# Patient Record
Sex: Female | Born: 1955 | Race: White | Hispanic: No | Marital: Single | Smoking: Current every day smoker
Health system: Southern US, Community
[De-identification: ages and names within clinical notes are randomized; demographics above are authoritative.]

## PROBLEM LIST (undated history)

## (undated) VITALS — BP 126/87 | HR 93 | Temp 98.4°F | Resp 20 | Ht 64.96 in | Wt 149.0 lb

## (undated) DIAGNOSIS — R569 Unspecified convulsions: Secondary | ICD-10-CM

## (undated) DIAGNOSIS — T7840XA Allergy, unspecified, initial encounter: Secondary | ICD-10-CM

## (undated) DIAGNOSIS — K219 Gastro-esophageal reflux disease without esophagitis: Secondary | ICD-10-CM

## (undated) DIAGNOSIS — G709 Myoneural disorder, unspecified: Secondary | ICD-10-CM

## (undated) DIAGNOSIS — F32A Depression, unspecified: Secondary | ICD-10-CM

## (undated) DIAGNOSIS — J449 Chronic obstructive pulmonary disease, unspecified: Secondary | ICD-10-CM

## (undated) DIAGNOSIS — R0600 Dyspnea, unspecified: Secondary | ICD-10-CM

## (undated) DIAGNOSIS — I480 Paroxysmal atrial fibrillation: Secondary | ICD-10-CM

## (undated) DIAGNOSIS — I10 Essential (primary) hypertension: Secondary | ICD-10-CM

## (undated) DIAGNOSIS — G629 Polyneuropathy, unspecified: Secondary | ICD-10-CM

## (undated) DIAGNOSIS — F419 Anxiety disorder, unspecified: Secondary | ICD-10-CM

## (undated) DIAGNOSIS — R519 Headache, unspecified: Secondary | ICD-10-CM

## (undated) DIAGNOSIS — R32 Unspecified urinary incontinence: Secondary | ICD-10-CM

## (undated) DIAGNOSIS — Z7901 Long term (current) use of anticoagulants: Secondary | ICD-10-CM

## (undated) DIAGNOSIS — F329 Major depressive disorder, single episode, unspecified: Secondary | ICD-10-CM

## (undated) DIAGNOSIS — K3532 Acute appendicitis with perforation and localized peritonitis, without abscess: Secondary | ICD-10-CM

## (undated) DIAGNOSIS — F1010 Alcohol abuse, uncomplicated: Secondary | ICD-10-CM

## (undated) DIAGNOSIS — H269 Unspecified cataract: Secondary | ICD-10-CM

## (undated) HISTORY — DX: Long term (current) use of anticoagulants: Z79.01

## (undated) HISTORY — PX: APPENDECTOMY: SHX54

## (undated) HISTORY — DX: Chronic obstructive pulmonary disease, unspecified: J44.9

## (undated) HISTORY — DX: Allergy, unspecified, initial encounter: T78.40XA

---

## 1999-12-24 ENCOUNTER — Other Ambulatory Visit: Admission: RE | Admit: 1999-12-24 | Discharge: 1999-12-24 | Payer: Self-pay | Admitting: Obstetrics & Gynecology

## 2004-07-04 ENCOUNTER — Emergency Department (HOSPITAL_COMMUNITY): Admission: EM | Admit: 2004-07-04 | Discharge: 2004-07-04 | Payer: Self-pay | Admitting: Family Medicine

## 2006-08-29 ENCOUNTER — Other Ambulatory Visit: Admission: RE | Admit: 2006-08-29 | Discharge: 2006-08-29 | Payer: Self-pay | Admitting: Obstetrics and Gynecology

## 2007-05-14 ENCOUNTER — Emergency Department (HOSPITAL_COMMUNITY): Admission: EM | Admit: 2007-05-14 | Discharge: 2007-05-14 | Payer: Self-pay | Admitting: Emergency Medicine

## 2008-08-07 ENCOUNTER — Encounter (INDEPENDENT_AMBULATORY_CARE_PROVIDER_SITE_OTHER): Payer: Self-pay | Admitting: Surgery

## 2008-08-07 ENCOUNTER — Inpatient Hospital Stay (HOSPITAL_COMMUNITY): Admission: EM | Admit: 2008-08-07 | Discharge: 2008-08-12 | Payer: Self-pay | Admitting: Emergency Medicine

## 2008-08-23 ENCOUNTER — Encounter: Admission: RE | Admit: 2008-08-23 | Discharge: 2008-08-23 | Payer: Self-pay | Admitting: Surgery

## 2008-10-03 ENCOUNTER — Other Ambulatory Visit: Admission: RE | Admit: 2008-10-03 | Discharge: 2008-10-03 | Payer: Self-pay | Admitting: Obstetrics and Gynecology

## 2010-02-12 HISTORY — PX: OTHER SURGICAL HISTORY: SHX169

## 2010-02-14 ENCOUNTER — Emergency Department (HOSPITAL_COMMUNITY): Admission: EM | Admit: 2010-02-14 | Discharge: 2010-02-14 | Payer: Self-pay | Admitting: Emergency Medicine

## 2010-08-27 LAB — DIFFERENTIAL
Basophils Relative: 0 % (ref 0–1)
Lymphocytes Relative: 11 % — ABNORMAL LOW (ref 12–46)
Lymphs Abs: 1 10*3/uL (ref 0.7–4.0)
Neutro Abs: 7.4 10*3/uL (ref 1.7–7.7)

## 2010-08-27 LAB — CBC
HCT: 42.5 % (ref 36.0–46.0)
MCH: 32.5 pg (ref 26.0–34.0)
MCHC: 34.4 g/dL (ref 30.0–36.0)
MCV: 94.7 fL (ref 78.0–100.0)
Platelets: 148 10*3/uL — ABNORMAL LOW (ref 150–400)
WBC: 9.2 10*3/uL (ref 4.0–10.5)

## 2010-08-27 LAB — POCT I-STAT, CHEM 8
Chloride: 104 mEq/L (ref 96–112)
Creatinine, Ser: 0.7 mg/dL (ref 0.4–1.2)
Hemoglobin: 15.6 g/dL — ABNORMAL HIGH (ref 12.0–15.0)
Potassium: 3.6 mEq/L (ref 3.5–5.1)
Sodium: 135 mEq/L (ref 135–145)
TCO2: 23 mmol/L (ref 0–100)

## 2010-08-27 LAB — PROTIME-INR: Prothrombin Time: 13.4 seconds (ref 11.6–15.2)

## 2010-09-29 LAB — URINALYSIS, ROUTINE W REFLEX MICROSCOPIC
Glucose, UA: NEGATIVE mg/dL
Hgb urine dipstick: NEGATIVE
Ketones, ur: 15 mg/dL — AB
Nitrite: NEGATIVE
Protein, ur: 100 mg/dL — AB
Specific Gravity, Urine: 1.027 (ref 1.005–1.030)
Urobilinogen, UA: 1 mg/dL (ref 0.0–1.0)
pH: 6 (ref 5.0–8.0)

## 2010-09-29 LAB — COMPREHENSIVE METABOLIC PANEL WITH GFR
AST: 18 U/L (ref 0–37)
Albumin: 3.3 g/dL — ABNORMAL LOW (ref 3.5–5.2)
Alkaline Phosphatase: 55 U/L (ref 39–117)
Creatinine, Ser: 0.94 mg/dL (ref 0.4–1.2)
GFR calc Af Amer: >60 mL/min (ref >60)
Sodium: 135 meq/L (ref 135–145)
Total Protein: 6.8 g/dL (ref 6.0–8.3)

## 2010-09-29 LAB — COMPREHENSIVE METABOLIC PANEL
ALT: 14 U/L (ref 0–35)
BUN: 9 mg/dL (ref 6–23)
CO2: 22 mEq/L (ref 19–32)
Calcium: 9.2 mg/dL (ref 8.4–10.5)
Chloride: 101 mEq/L (ref 96–112)
GFR calc non Af Amer: >60 mL/min (ref >60)
Glucose, Bld: 128 mg/dL — ABNORMAL HIGH (ref 70–99)
Potassium: 4.1 mEq/L (ref 3.5–5.1)
Total Bilirubin: 0.9 mg/dL (ref 0.3–1.2)

## 2010-09-29 LAB — URINE MICROSCOPIC-ADD ON

## 2010-09-29 LAB — CBC
HCT: 33.2 % — ABNORMAL LOW (ref 36.0–46.0)
Hemoglobin: 10.6 g/dL — ABNORMAL LOW (ref 12.0–15.0)
MCHC: 31.9 g/dL (ref 30.0–36.0)
MCV: 65.7 fL — ABNORMAL LOW (ref 78.0–100.0)
Platelets: 248 10*3/uL (ref 150–400)
RBC: 5.06 MIL/uL (ref 3.87–5.11)
RDW: 21.1 % — ABNORMAL HIGH (ref 11.5–15.5)
WBC: 12.9 10*3/uL — ABNORMAL HIGH (ref 4.0–10.5)

## 2010-09-29 LAB — DIFFERENTIAL
Basophils Absolute: 0 10*3/uL (ref 0.0–0.1)
Basophils Relative: 0 % (ref 0–1)
Eosinophils Absolute: 0 10*3/uL (ref 0.0–0.7)
Eosinophils Relative: 0 % (ref 0–5)
Lymphocytes Relative: 4 % — ABNORMAL LOW (ref 12–46)
Lymphs Abs: 0.5 10*3/uL — ABNORMAL LOW (ref 0.7–4.0)
Monocytes Absolute: 0.9 K/uL (ref 0.1–1.0)
Monocytes Relative: 7 % (ref 3–12)
Neutro Abs: 11.5 K/uL — ABNORMAL HIGH (ref 1.7–7.7)
Neutrophils Relative %: 89 % — ABNORMAL HIGH (ref 43–77)
Smear Review: ADEQUATE

## 2010-09-29 LAB — PREGNANCY, URINE: Preg Test, Ur: NEGATIVE

## 2010-09-29 LAB — LIPASE, BLOOD: Lipase: 23 U/L (ref 11–59)

## 2010-10-27 NOTE — Op Note (Signed)
Karen Casey, Karen Casey              ACCOUNT NO.:  [id number]   MEDICAL RECORD NO.:  [id number]          PATIENT TYPE:  INP   LOCATION:  5152                         FACILITY:  [facility]   PHYSICIAN:  Maisie Fus A. Cornett, M.D.DATE OF BIRTH:  [birth year]   DATE OF PROCEDURE:  DATE OF DISCHARGE:                               OPERATIVE REPORT   PREOPERATIVE DIAGNOSIS:  Perforated appendicitis with right lower  quadrant abscess and phlegmon.   POSTOPERATIVE DIAGNOSIS:  Perforated appendicitis with right lower  quadrant abscess and phlegmon plus diffuse peritonitis.   PROCEDURE:  Laparoscopic appendectomy for perforated appendicitis with  periappendiceal abscess.   SURGEON:  Maisie Fus A. Cornett, MD   ANESTHESIA:  General endotracheal anesthesia with 0.25% Sensorcaine  local.   ESTIMATED BLOOD LOSS:  50 mL.   SPECIMEN:  Appendix to Pathology.   DRAINS:  53 Blake to right lower quadrant.   INDICATIONS FOR PROCEDURE:  The patient is a 55 year old female with 4-  day history of abdominal pain.  CT scan showed right lower quadrant  phlegmon, perforated appendicitis.  On examination, she had diffuse  appendicitis.  We recommended laparoscopic possible laparotomy to treat  this.  Risk of the procedure was discussed with the patient.  I met the  patient in the holding area after she was initially evaluated by Dr.  Claud Kelp and Junious Silk, NP.  I discussed the procedure,  answering questions to the best of my ability and she understood and  agreed to proceed.  Risk of bleeding, infection, possible laparotomy,  possible bowel resection, abscess formation, injury to bowel or other  organ, potential risks were fistula, overall potential complications.  The family voiced agreement to proceed.   DESCRIPTION OF PROCEDURE:  The patient was brought to the operating  room.  After induction of general anesthesia, both arms were tucked.  Foley catheter was placed.  The abdomen was prepped and  draped in  sterile fashion.  A 1-cm supraumbilical incision was made.  Dissection  was carried down to the fascia.  The fascia was opened with the scalpel.  Pursestring suture of 0 Vicryl was placed and I placed my finger through  the peritoneal lining into the abdominal cavity.  A 12-mm Hasson cannula  was placed under direct vision.  Pneumoperitoneum was created at 15 mmHg  of CO2.  Laparoscope was placed.  There was a significant right lower  quadrant phlegmon.  I placed two 5-mm ports both in left lower quadrant  since the bowel was stuck up to the anterior abdominal wall toward the  midline.  Once I did this, I was able to gently free up the small bowel,  break up the interloop, phlegmon, and abscesses involving the ileum.  Once I did this, I was able to identify the cecum which was acutely  inflamed.  I was able then to place the midline 5 mL port at this point.  Once I did this, I was able to roll the cecum over, break up numerous  loculations, and break into an abscess cavity.  At the base of this was  the appendix and it  appeared to have ruptured in the mid appendix.  I  was able to grab the tip and pull the appendix up.  In the base, the  appendix was remarkably healthy.  There was acute inflammation involving  the cecum, but the base of the appendix at the junction of cecum  appeared healthy and the tissue looked quite good.  Harmonic scalpel was  used to take the mesoappendix down.  Once I did this, I used a 5-mm  scope to the lower port and used a GIA 45 stapling device with a white  vascular cartridge and put this across the base of the appendix and  mesoappendix and fired this.  This amputated the appendix and did a nice  closure on the stump.  This was placed in an EndoCatch bag, extracted,  and passed off the field.  There was significant right lower quadrant  phlegmon and I was able to break up a lot of the adhesions between the  loops since there was exudative changes to  the ileum.  I used 9 L of  irrigation to irrigate out the entire abdominal cavity especially the  right lower quadrant until it was clear.  I broke up any interloop  adhesions to make sure that there was no formation of interloop  abscesses.  There was also some adhesions down to the omentum and  phlegmon.  I was able to separate the bowel quite easily without  injuring the bowel.  I examined the ileum carefully.  There was  significant exudative changes, but no evidence of bowel injury.  There  was a right lower quadrant abscess cavity just lateral to the cecum.  Once I irrigated this, I used Tisseel on the stump, given the amount of  inflammation and put this across the mesoappendix as well since there  was some oozing from this.  Hemostasis was excellent.  The irrigation  was suctioned out until was clear.  To the lower port site, I placed a  19 Blake drain into the right lower quadrant along the cecum and  extended down into the right pelvis.  Irrigation was suctioned out and  this was clear.  I reexamined the abdominal cavity, I saw no evidence of  injury to the cecum or small bowel or ascending colon.  The gallbladder,  liver, stomach, and remainder of the transverse, descending, and sigmoid  colon all appeared grossly normal.  Small bowel except for the above-  mentioned changes was grossly normal.  Uterus appeared normal.  I could  not see right or left adnexa well.  After reexamining the abdominal  cavity, I sutured the drain to the skin with 2-0 nylon.  This was placed  to bulb suction.  I then removed my ports under direct vision with no  signs of port site bleeding.  I closed the umbilical incision with a  pursestring suture of 0 Vicryl.  4-0 Monocryl was used to close skin.  I  opened the specimen, examined it myself, and found to be an appendiceal  perforation.  The appendix was for the most part gangrenous, quite  liquefied in look like.  The distal end appeared to be the  healthiest  firmest tissue where the staple line was.  Dermabond was applied.  All  final counts of sponge, needle, and instruments were found be correct at  this portion of the case.  The patient was then awoke and taken to  recovery in satisfactory condition.      Maisie Fus  Liberty Handy, M.D.  Electronically Signed     TAC/MEDQ  D:  08/07/2008  T:  08/08/2008  Job:  [id number]

## 2010-10-27 NOTE — H&P (Signed)
Karen Casey, Karen Casey              ACCOUNT NO.:  [id number]   MEDICAL RECORD NO.:  [id number]          PATIENT TYPE:  INP   LOCATION:  5152                         FACILITY:  [facility]   PHYSICIAN:  Angelia Mould. Derrell Lolling, M.D.DATE OF BIRTH:  05/04/1956   DATE OF ADMISSION:  08/07/2008  DATE OF DISCHARGE:                              HISTORY & PHYSICAL   PSYCHIATRIST:  Milagros Evener, M.D.   CHIEF COMPLAINT:  Acute appendicitis.   HISTORY OF PRESENT ILLNESS:  Karen Casey is a 55 year old female patient  in her usual state of health this past Saturday, abrupt change by  Sunday.  She had intractable nausea and vomiting, duration about 6-8  hours on Monday.  She was noticing abdominal pain focalized to the right  lower quadrant and this pain has progressively increased to the point  where it has become unbearable.  She initially thought she had reflux  symptoms when her symptoms first started on Sunday.  She finally  presented to the ER today where she was found to have leukocytosis,  significant abdominal pain with guarding and rebounding on exam.  CT of  the abdomen and pelvis was consistent with appendicitis and probable  perforation.   REVIEW OF SYSTEMS:  As per the history of present illness.  GI:  The  patient has been bloated for at least 2 days.  Her jeans would not fit  today and normally they are loose.  She has been able to keep liquids  down, but has anorexia and has not had solid foods since onset of  symptoms.  HEMATOLOGY/GYN:  The patient was noted to have an anemia on  her blood count here.  She does not recall having a history of anemia,  though reports she has had heavy cycles lately, but has not followed up  with a GYN doctor.   SOCIAL HISTORY:  Significant for tobacco one-half pack per day for  greater than 20 years.  She admits daily use of beer at least 3-5 per  day.  She currently works as a Solicitor at [employer], previously she worked  at [employer] for many years, but  lost that position when the company  went bankrupt.  She has a living boyfriend for 23 years and she has 1  child.   FAMILY HISTORY:  Noncontributory.   PAST MEDICAL HISTORY:  1. GERD.  2. Anxiety and depression.  3. Tobacco abuse.  4. Questionable alcoholism based on the patient's self-reported      alcohol intake.   PAST SURGICAL HISTORY:  Childhood removal of lesion in earlobe at age  43.   ALLERGIES:  NKDA.   CURRENT MEDICATIONS:  1. Over-the-counter GERD medicines such as Prilosec or Zantac.  2. Clonazepam 2 mg q.i.d.   The patient again reports increased stress and depression after [employer] closed down and her job was eliminated.   PHYSICAL EXAMINATION:  GENERAL:  Very anxious female patient complaining  of severe abdominal pain and very concerned over having to have  abdominal surgery.  VITAL SIGNS:  T-max 100.8, BP 152/77, pulse 110, and respirations 20.  PSYCH:  The patient is anxious somewhat more than to be expected for  current situation, but she is alert and oriented x3.  NEUROLOGIC:  Cranial nerves II-XII are grossly intact.  She is moving  all extremities x4.  No focal neurological deficits.  EYES:  Eyes are injected bilaterally.  Sclerae are injected bilaterally.  EARS, NOSE, AND THROAT:  Ears are symmetrical.  No otorrhea.  Nose is  midline.  No rhinorrhea.  Oral mucous membranes are pink, but dry.  CHEST:  Bilateral lung sounds clear to auscultation anteriorly.  Respiratory rate is nonlabored.  She is on room air.  CARDIOVASCULAR:  Heart sounds are S1 and S2.  No rubs, murmurs, thrills,  or gallops.  Pulses is regular, but tachycardiac.  No JVD.  No  peripheral edema.  ABDOMEN:  Distended, but soft.  She has bowel sounds present that are  hyperactive.  She has a reddish mottling appearance to the abdomen.  She  has guarding in the right lower quadrant and left lower quadrant  and  diffuse rebound tenderness.  EXTREMITIES:  Symmetrical in appearance  without cyanosis or clubbing.   LABORATORY DATA:  Sodium 135, potassium 4.1, CO2 22, glucose 128, BUN 9,  and creatinine is 0.94.  White count 12,900, neutrophils are 88%,  hemoglobin 10.6, and platelets 246,000.  Diagnostic CT of the abdomen  and pelvis again is consistent with appendicitis with perforation.  There is also inflammatory change adjacent to the distal small bowel  cecum and ascending colon.   IMPRESSION:  1. Acute perforated appendicitis with associated peritonitis,  and      leukocytosis and fever.  2. Volume depletion, evolving systemic inflammatory response syndrome.  3. Anxiety and depression disorder, on medications followed by      psychiatrist as outpatient.  4. Ongoing tobacco abuse.  5. Anemia suspected related to recent heavy menses, question      transitioning into menopause.  6. Suspected alcoholism based on self-reported alcohol intake history.   PLAN:  1. Admit to inpatient general surgical floor today and OR tonight as      soon as the room and surgeon available.  2. Aggressive fluid resuscitation with D5 half normal saline with 20 K      at 200 an hour.  3. Empiric Zosyn for enteric pathogen coverage.  4. Treat symptoms, Dilaudid for pain, Ativan for anxiety disorder      especially given the patient's history of regular clonazepam use      q.i.d. as well as possible issues related to anxiety with tobacco      and maybe alcohol withdrawal,      Zofran for any nausea.  5. Risks and benefits of procedure have been discussed with the      patient per Dr. Derrell Lolling and she verbalizes understanding.       Karen Casey, N.P.      Angelia Mould. Derrell Lolling, M.D.  Electronically Signed    ALE/MEDQ  D:  08/07/2008  T:  08/08/2008  Job:  [id number]   cc:   Milagros Evener, M.D.

## 2010-10-30 NOTE — Discharge Summary (Signed)
Karen Casey, Karen Casey              ACCOUNT NO.:  [id number]   MEDICAL RECORD NO.:  [id number]          PATIENT TYPE:  INP   LOCATION:  5152                         FACILITY:  [facility]   PHYSICIAN:  Angelia Mould. Derrell Lolling, M.D.DATE OF BIRTH:  1955-06-19   DATE OF ADMISSION:  08/07/2008  DATE OF DISCHARGE:  08/12/2008                               DISCHARGE SUMMARY   CHIEF COMPLAINT AND REASON FOR ADMISSION:  Ms. Rehm is a 55 year old  female patient who presented to the ER with severe right lower quadrant  abdominal pain ongoing for several days.  She initially thought, she had  a viral syndrome.  The pain became very severe, so she presented to the  ER.  She was found to have elevated white count of 12,900, neutrophils  88%.  CT scan consistent with probable acute perforated appendicitis.  On abdominal exam, she has signs of an acute abdomen with generalized  diffuse tenderness, more so in the right lower quadrant with guarding  and rebounding.   The patient was admitted with the following diagnoses.  1. Acute perforated appendicitis with peritonitis, leukocytosis, and      fever.  2. Volume depletion.  3. Known anxiety and depression disorder.  4. Medications, followed by psychiatrist, outpatient.  5. Ongoing tobacco abuse.  6. Suspected alcoholism based on the patient's self report, alcohol      history, see H and P.  7. Acute blood loss anemia, secondary to self report as heavy menses.   HOSPITAL COURSE:  The patient was admitted and taken to the OR where she  underwent laparoscopic appendectomy for perforated appendicitis with  abscess and peritonitis.  This was done by Dr. Luisa Hart.  The patient was  sent back to the general floor to recover.  She had been placed on  empiric antibiotic therapy prior to surgery with Zosyn and medications  for pain have been given as well.  She was tolerating Dilaudid, these  same medications were continued in the postoperative period.   Throughout remainder of the hospitalization, the patient slowly  transitioned from n.p.o. status to clear liquids to regular diet.  She  was transitioned over to oral pain medication.  She had a JP drain in  place with cloudy serous fluid that began to clear up over a several day  period, JP drain was opted to be left in place at discharge due to the  fact that the patient did have significant peritonitis.  She was  transitioned over from Zosyn to Augmentin and her pain was well managed  on Vicodin.   On postop day 5, she was determined ready for discharge.  She was  afebrile.  Her vital signs were stable.  She is tolerating the diet.  Bowel sounds are present.  She was having bowel movements.   FINAL DISCHARGE DIAGNOSES:  1. Acute abdomen with clinical signs of peritonitis, secondary to      perforated appendicitis with abscess.  2. Anemia.  The patient is still having heavy menses, question      transitioning the menopause.  3. Volume depletion, secondary to nausea and vomiting prior  to admit.      Resolved.  4. Ongoing tobacco and self-reported alcohol abuse.  5. History of anxiety and depression disorder followed by psychiatry      as an outpatient.   DISCHARGE MEDICATIONS:  The patient will resume the following home  medications.  1. Omeprazole 20 mg daily.  2. Clonazepam 2 mg q.i.d.  3. She will also start treatment with medications of Augmentin 875 mg      p.o. b.i.d.  4. Vicodin 1-2 tabs every 4 hours as needed for pain.   Wound care have Steri-Strips draw, okay to shower.  ENT will record  drainage from JP drain and bring to doctor visits.  Diet, low-sodium and  heart-healthy.  Return to work in 4 weeks.  Increase activity slowly.  May walk up steps.  No showering until after the JP drain is removed.  No lifting greater than 10 pounds for 2 weeks.  No driving for 1 week.      Karen Casey, N.P.      Angelia Mould. Derrell Lolling, M.D.  Electronically Signed     ALE/MEDQ  D:  09/11/2008  T:  09/12/2008  Job:  [id number]   cc:   Maisie Fus A. Cornett, M.D.

## 2011-03-23 LAB — POCT CARDIAC MARKERS
CKMB, poc: <1 — ABNORMAL LOW
Myoglobin, poc: 44
Operator id: 3206
Troponin i, poc: <0.05

## 2011-03-23 LAB — CBC
HCT: 26.7 — ABNORMAL LOW
Hemoglobin: 8.5 — ABNORMAL LOW
MCHC: 31.9
Platelets: 317
RBC: 3.81 — ABNORMAL LOW
WBC: 4.9

## 2011-03-23 LAB — BASIC METABOLIC PANEL
BUN: 7
CO2: 24
GFR calc non Af Amer: >60

## 2011-03-23 LAB — DIFFERENTIAL
Basophils Relative: 0
Eosinophils Relative: 1
Lymphocytes Relative: 20
Monocytes Relative: 10
Neutro Abs: 3.3

## 2011-10-13 ENCOUNTER — Encounter (HOSPITAL_COMMUNITY): Payer: Self-pay | Admitting: *Deleted

## 2011-10-13 ENCOUNTER — Emergency Department (HOSPITAL_COMMUNITY)
Admission: EM | Admit: 2011-10-13 | Discharge: 2011-10-13 | Disposition: A | Discharge status: Home / Self Care | Payer: Self-pay | Attending: Emergency Medicine | Admitting: Emergency Medicine

## 2011-10-13 DIAGNOSIS — F17200 Nicotine dependence, unspecified, uncomplicated: Secondary | ICD-10-CM | POA: Insufficient documentation

## 2011-10-13 DIAGNOSIS — Z79899 Other long term (current) drug therapy: Secondary | ICD-10-CM | POA: Insufficient documentation

## 2011-10-13 DIAGNOSIS — F341 Dysthymic disorder: Secondary | ICD-10-CM | POA: Insufficient documentation

## 2011-10-13 DIAGNOSIS — R5381 Other malaise: Secondary | ICD-10-CM | POA: Insufficient documentation

## 2011-10-13 DIAGNOSIS — E876 Hypokalemia: Secondary | ICD-10-CM | POA: Insufficient documentation

## 2011-10-13 DIAGNOSIS — R112 Nausea with vomiting, unspecified: Secondary | ICD-10-CM | POA: Insufficient documentation

## 2011-10-13 DIAGNOSIS — R10816 Epigastric abdominal tenderness: Secondary | ICD-10-CM | POA: Insufficient documentation

## 2011-10-13 DIAGNOSIS — R1013 Epigastric pain: Secondary | ICD-10-CM | POA: Insufficient documentation

## 2011-10-13 DIAGNOSIS — K219 Gastro-esophageal reflux disease without esophagitis: Secondary | ICD-10-CM | POA: Insufficient documentation

## 2011-10-13 DIAGNOSIS — R197 Diarrhea, unspecified: Secondary | ICD-10-CM | POA: Insufficient documentation

## 2011-10-13 HISTORY — DX: Depression, unspecified: F32.A

## 2011-10-13 HISTORY — DX: Anxiety disorder, unspecified: F41.9

## 2011-10-13 HISTORY — DX: Gastro-esophageal reflux disease without esophagitis: K21.9

## 2011-10-13 HISTORY — DX: Major depressive disorder, single episode, unspecified: F32.9

## 2011-10-13 LAB — OCCULT BLOOD, POC DEVICE: Fecal Occult Bld: NEGATIVE

## 2011-10-13 LAB — CBC
Platelets: 215 10*3/uL (ref 150–400)
RDW: 13 % (ref 11.5–15.5)
WBC: 7.1 10*3/uL (ref 4.0–10.5)

## 2011-10-13 LAB — URINALYSIS, ROUTINE W REFLEX MICROSCOPIC
Ketones, ur: NEGATIVE mg/dL
Nitrite: NEGATIVE
Specific Gravity, Urine: 1.006 (ref 1.005–1.030)
Urobilinogen, UA: 0.2 mg/dL (ref 0.0–1.0)
pH: 6 (ref 5.0–8.0)

## 2011-10-13 LAB — LIPASE, BLOOD: Lipase: 22 U/L (ref 11–59)

## 2011-10-13 LAB — URINE MICROSCOPIC-ADD ON

## 2011-10-13 LAB — HEPATIC FUNCTION PANEL
AST: 37 U/L (ref 0–37)
Albumin: 4 g/dL (ref 3.5–5.2)
Bilirubin, Direct: <0.1 mg/dL (ref 0.0–0.3)

## 2011-10-13 LAB — POCT I-STAT TROPONIN I

## 2011-10-13 LAB — BASIC METABOLIC PANEL
Chloride: 89 mEq/L — ABNORMAL LOW (ref 96–112)
GFR calc Af Amer: 89 mL/min — ABNORMAL LOW (ref >90)
GFR calc non Af Amer: 77 mL/min — ABNORMAL LOW (ref >90)
Potassium: 2.7 mEq/L — CL (ref 3.5–5.1)

## 2011-10-13 LAB — POTASSIUM: Potassium: 3.7 mEq/L (ref 3.5–5.1)

## 2011-10-13 MED ORDER — LORAZEPAM 2 MG/ML IJ SOLN
1.0000 mg | Freq: Once | INTRAMUSCULAR | Status: AC
  Administered 2011-10-13: 1 mg via INTRAVENOUS
  Filled 2011-10-13: qty 1

## 2011-10-13 MED ORDER — POTASSIUM CHLORIDE 10 MEQ/100ML IV SOLN
10.0000 meq | INTRAVENOUS | Status: AC
  Administered 2011-10-13 (×2): 10 meq via INTRAVENOUS
  Filled 2011-10-13 (×2): qty 100

## 2011-10-13 MED ORDER — POTASSIUM CHLORIDE CRYS ER 20 MEQ PO TBCR
40.0000 meq | EXTENDED_RELEASE_TABLET | Freq: Once | ORAL | Status: AC
  Administered 2011-10-13: 40 meq via ORAL
  Filled 2011-10-13: qty 2

## 2011-10-13 MED ORDER — PANTOPRAZOLE SODIUM 40 MG IV SOLR
40.0000 mg | Freq: Once | INTRAVENOUS | Status: AC
  Administered 2011-10-13: 40 mg via INTRAVENOUS
  Filled 2011-10-13: qty 40

## 2011-10-13 MED ORDER — SODIUM CHLORIDE 0.9 % IV BOLUS (SEPSIS)
1000.0000 mL | Freq: Once | INTRAVENOUS | Status: AC
  Administered 2011-10-13: 1000 mL via INTRAVENOUS

## 2011-10-13 MED ORDER — ONDANSETRON HCL 4 MG/2ML IJ SOLN
4.0000 mg | Freq: Once | INTRAMUSCULAR | Status: AC
  Administered 2011-10-13: 4 mg via INTRAVENOUS
  Filled 2011-10-13: qty 2

## 2011-10-13 NOTE — ED Notes (Signed)
Pt states that she has "really bad acid reflux" and she has been having abdominal pain, nausea and vomiting with this chronically but increased since Monday.  Pt states that she had 3 loose black and green stools since last night.  Pt is having nausea and abdominal pain with this.

## 2011-10-13 NOTE — ED Notes (Signed)
Patient resting comfortably on stretcher given warm blankets and turned on TV for patient.

## 2011-10-13 NOTE — ED Provider Notes (Signed)
Patient moved to CDU for continued IV and PO potassium with recheck.  Pt reports she feels much better.  Notes chronic epigastric pain with N/V, never before been assessed.  No PCP currently.  Will give resources for PCP and GI follow up.  Potassium recheck is normal, will not send home on potassium.  Discussed with Dr Manus Gunning who agrees with plan.   Results for orders placed during the hospital encounter of 10/13/11  CBC      Component Value Range   WBC 7.1  4.0 - 10.5 (K/uL)   RBC 5.01  3.87 - 5.11 (MIL/uL)   Hemoglobin 16.7 (*) 12.0 - 15.0 (g/dL)   HCT 16.1 (*) 09.6 - 46.0 (%)   MCV 92.8  78.0 - 100.0 (fL)   MCH 33.3  26.0 - 34.0 (pg)   MCHC 35.9  30.0 - 36.0 (g/dL)   RDW 04.5  40.9 - 81.1 (%)   Platelets 215  150 - 400 (K/uL)  BASIC METABOLIC PANEL      Component Value Range   Sodium 133 (*) 135 - 145 (mEq/L)   Potassium 2.7 (*) 3.5 - 5.1 (mEq/L)   Chloride 89 (*) 96 - 112 (mEq/L)   CO2 27  19 - 32 (mEq/L)   Glucose, Bld 124 (*) 70 - 99 (mg/dL)   BUN 5 (*) 6 - 23 (mg/dL)   Creatinine, Ser 9.14  0.50 - 1.10 (mg/dL)   Calcium 78.2  8.4 - 10.5 (mg/dL)   GFR calc non Af Amer 77 (*) >90 (mL/min)   GFR calc Af Amer 89 (*) >90 (mL/min)  URINALYSIS, ROUTINE W REFLEX MICROSCOPIC      Component Value Range   Color, Urine YELLOW  YELLOW    APPearance CLEAR  CLEAR    Specific Gravity, Urine 1.006  1.005 - 1.030    pH 6.0  5.0 - 8.0    Glucose, UA NEGATIVE  NEGATIVE (mg/dL)   Hgb urine dipstick NEGATIVE  NEGATIVE    Bilirubin Urine NEGATIVE  NEGATIVE    Ketones, ur NEGATIVE  NEGATIVE (mg/dL)   Protein, ur NEGATIVE  NEGATIVE (mg/dL)   Urobilinogen, UA 0.2  0.0 - 1.0 (mg/dL)   Nitrite NEGATIVE  NEGATIVE    Leukocytes, UA TRACE (*) NEGATIVE   HEPATIC FUNCTION PANEL      Component Value Range   Total Protein 7.3  6.0 - 8.3 (g/dL)   Albumin 4.0  3.5 - 5.2 (g/dL)   AST 37  0 - 37 (U/L)   ALT 28  0 - 35 (U/L)   Alkaline Phosphatase 110  39 - 117 (U/L)   Total Bilirubin 0.3  0.3 - 1.2  (mg/dL)   Bilirubin, Direct <9.5  0.0 - 0.3 (mg/dL)   Indirect Bilirubin NOT CALCULATED  0.3 - 0.9 (mg/dL)  LIPASE, BLOOD      Component Value Range   Lipase 22  11 - 59 (U/L)  OCCULT BLOOD, POC DEVICE      Component Value Range   Fecal Occult Bld NEGATIVE     Comment 1 CORRECTED PER [id number].     Comment 2 CHARGE CREDITED    POCT I-STAT TROPONIN I      Component Value Range   Troponin i, poc 0.00  0.00 - 0.08 (ng/mL)   Comment 3           URINE MICROSCOPIC-ADD ON      Component Value Range   Squamous Epithelial / LPF FEW (*) RARE    WBC,  UA 0-2  <3 (WBC/hpf)   RBC / HPF 0-2  <3 (RBC/hpf)   Bacteria, UA FEW (*) RARE   POTASSIUM      Component Value Range   Potassium 3.7  3.5 - 5.1 (mEq/L)   No results found.    Karen Casey, [place] 10/13/11 1540

## 2011-10-13 NOTE — ED Provider Notes (Signed)
History     CSN: [id number]  Arrival date & time 10/13/11  1191   First MD Initiated Contact with Patient 10/13/11 [phone]      Chief Complaint  Patient presents with  . Abdominal Pain    (Consider location/radiation/quality/duration/timing/severity/associated sxs/prior treatment) HPI Comments: Patient presents with 3 days of nausea, vomiting, epigastric pain and diarrhea. Reports a history of "really bad reflux" for which he takes Nexium. She however does not have a doctor or gastroenterologist and is never had an EGD. She's had persistent nausea and vomiting with some blood streaks last night. He also is concerned that her stool was green-colored with some black specks. His epigastric abdominal pain that does not radiate. Nothing makes her symptoms better or worse. She denies any chest pain or shortness of breath. No fevers or urinary symptoms. She denies excessive alcohol use.  The history is provided by the patient.    Past Medical History  Diagnosis Date  . GERD (gastroesophageal reflux disease)   . Anxiety   . Depression     Past Surgical History  Procedure Date  . Appendectomy     History reviewed. No pertinent family history.  History  Substance Use Topics  . Smoking status: Current Everyday Smoker -- 0.5 packs/day    Types: Cigarettes  . Smokeless tobacco: Not on file  . Alcohol Use: Yes    OB History    Grav Para Term Preterm Abortions TAB SAB Ect Mult Living                  Review of Systems  Constitutional: Positive for chills, activity change, appetite change and fatigue. Negative for fever.  HENT: Negative for rhinorrhea.   Eyes: Negative for visual disturbance.  Respiratory: Negative for cough, chest tightness and shortness of breath.   Cardiovascular: Negative for chest pain.  Gastrointestinal: Positive for nausea, vomiting, abdominal pain and diarrhea.  Genitourinary: Negative for dysuria and hematuria.  Musculoskeletal: Negative for back pain.    Neurological: Negative for dizziness, light-headedness and headaches.    Allergies  Review of patient's allergies indicates no known allergies.  Home Medications   Current Outpatient Rx  Name Route Sig Dispense Refill  . ALPRAZOLAM 1 MG PO TABS Oral Take 1 mg by mouth every 4 (four) hours as needed. For anxiety    . ESOMEPRAZOLE MAGNESIUM 20 MG PO CPDR Oral Take 20 mg by mouth daily before breakfast.    . ADULT MULTIVITAMIN W/MINERALS CH Oral Take 1 tablet by mouth daily.    Marland Kitchen OMEPRAZOLE 20 MG PO CPDR Oral Take 20 mg by mouth daily.      BP 133/70  Pulse 77  Temp(Src) 97.7 F (36.5 C) (Oral)  Resp 17  SpO2 100%  Physical Exam  Constitutional: She is oriented to person, place, and time. She appears well-developed and well-nourished. No distress.  HENT:  Head: Normocephalic and atraumatic.  Mouth/Throat: Oropharynx is clear and moist. No oropharyngeal exudate.  Eyes: Conjunctivae and EOM are normal. Pupils are equal, round, and reactive to light.  Neck: Normal range of motion. Neck supple.  Cardiovascular: Normal rate, regular rhythm and normal heart sounds.   Pulmonary/Chest: Effort normal and breath sounds normal. No respiratory distress.  Abdominal: Soft. There is tenderness. There is no rebound and no guarding.       Mild epigastric tenderness without guarding or rebound  Musculoskeletal: Normal range of motion. She exhibits no edema and no tenderness.  Neurological: She is alert and oriented to person,  place, and time. No cranial nerve deficit.  Skin: Skin is warm.    ED Course  Procedures (including critical care time)  Labs Reviewed  CBC - Abnormal; Notable for the following:    Hemoglobin 16.7 (*)    HCT 46.5 (*)    All other components within normal limits  BASIC METABOLIC PANEL - Abnormal; Notable for the following:    Sodium 133 (*)    Potassium 2.7 (*)    Chloride 89 (*)    Glucose, Bld 124 (*)    BUN 5 (*)    GFR calc non Af Amer 77 (*)    GFR calc  Af Amer 89 (*)    All other components within normal limits  URINALYSIS, ROUTINE W REFLEX MICROSCOPIC - Abnormal; Notable for the following:    Leukocytes, UA TRACE (*)    All other components within normal limits  URINE MICROSCOPIC-ADD ON - Abnormal; Notable for the following:    Squamous Epithelial / LPF FEW (*)    Bacteria, UA FEW (*)    All other components within normal limits  HEPATIC FUNCTION PANEL  LIPASE, BLOOD  OCCULT BLOOD, POC DEVICE  POCT I-STAT TROPONIN I   No results found.   No diagnosis found.    MDM  Nausea, vomiting, diarrhea, abdominal pain, history of reflux.  Consider GERD, PUD, gastritis, pancreatitis, biliary pathology.  Abdomen soft and nonsurgical.  Labs, IVF, symptom control  Patient feeling much improved after fluids. Abdomen soft. We'll attempt by mouth challenge.  Hypokalemia 2.7. We'll replete and placed in CDU for continued monitoring as electrolytes repleted.  Discussed with PAC Ledora Bottcher, MD 10/13/11 1155

## 2011-10-13 NOTE — Progress Notes (Signed)
Spoke with patient to discuss her health care practices. She states she used to go to [facility] but she was terminated from her job in September due to her illness and cannot afford to go. She said that she did go to [facility] but was told she could not get disability because of unemployment. I encouraged her to contact her caseworker again to discuss available options for her. In addition, I asked her permission to meet with community liaison [facility]) to discuss the orange card. Patient is in agreement with this.

## 2011-10-13 NOTE — ED Provider Notes (Signed)
Medical screening examination/treatment/procedure(s) were conducted as a shared visit with non-physician practitioner(s) and myself.  I personally evaluated the patient during the encounter   Glynn Octave, MD 10/13/11 1743

## 2011-10-13 NOTE — Discharge Instructions (Signed)
Your potassium level is now normal.  Please read the information below.  Use the resources below to find a primary care provider.  You may also call the gastroenterologist listed above for further evaluation of your chronic abdominal pain.  You may return to the ER at any time for worsening condition or any new symptoms that concern you.   Hypokalemia Hypokalemia means a low potassium level in the blood.Potassium is an electrolyte that helps regulate the amount of fluid in the body. It also stimulates muscle contraction and maintains a stable acid-base balance.Most of the body's potassium is inside of cells, and only a very small amount is in the blood. Because the amount in the blood is so small, minor changes can have big effects. PREPARATION FOR TEST Testing for potassium requires taking a blood sample taken by needle from a vein in the arm. The skin is cleaned thoroughly before the sample is drawn. There is no other special preparation needed. NORMAL VALUES Potassium levels below 3.5 mEq/L are abnormally low. Levels above 5.1 mEq/L are abnormally high. Ranges for normal findings may vary among different laboratories and hospitals. You should always check with your doctor after having lab work or other tests done to discuss the meaning of your test results and whether your values are considered within normal limits. MEANING OF TEST  Your caregiver will go over the test results with you and discuss the importance and meaning of your results, as well as treatment options and the need for additional tests, if necessary. A potassium level is frequently part of a routine medical exam. It is usually included as part of a whole "panel" of tests for several blood salts (such as Sodium and Chloride). It may be done as part of follow-up when a low potassium level was found in the past or other blood salts are suspected of being out of balance. A low potassium level might be suspected if you have one or more  of the following:  Symptoms of weakness.   Abnormal heart rhythms.   High blood pressure and are taking medication to control this, especially water pills (diuretics).   Kidney disease that can affect your potassium level .   Diabetes requiring the use of insulin. The potassium may fall after taking insulin, especially if the diabetes had been out of control for a while.   A condition requiring the use of cortisone-type medication or certain types of antibiotics.   Vomiting and/or diarrhea for more than a day or two.   A stomach or intestinal condition that may not permit appropriate absorption of potassium.   Fainting episodes.   Mental confusion.  OBTAINING TEST RESULTS It is your responsibility to obtain your test results. Ask the lab or department performing the test when and how you will get your results.  Please contact your caregiver directly if you have not received the results within one week. At that time, ask if there is anything different or new you should be doing in relation to the results. TREATMENT Hypokalemia can be treated with potassium supplements taken by mouth and/or adjustments in your current medications. A diet high in potassium is also helpful. Foods with high potassium content are:  Peas, lentils, lima beans, nuts, and dried fruit.   Whole grain and bran cereals and breads.   Fresh fruit, vegetables (bananas, cantaloupe, grapefruit, oranges, tomatoes, honeydew melons, potatoes).   Orange and tomato juices.   Meats. If potassium supplement has been prescribed for you today or your  medications have been adjusted, see your personal caregiver in time02 for a re-check.  SEEK MEDICAL CARE IF:  There is a feeling of worsening weakness.   You experience repeated chest palpitations.   You are diabetic and having difficulty keeping your blood sugars in the normal range.   You are experiencing vomiting and/or diarrhea.   You are having difficulty with  any of your regular medications.  SEEK IMMEDIATE MEDICAL CARE IF:  You experience chest pain, shortness of breath, or episodes of dizziness.   You have been having vomiting or diarrhea for more than 2 days.   You have a fainting episode.  MAKE SURE YOU:   Understand these instructions.   Will watch your condition.   Will get help right away if you are not doing well or get worse.  Document Released: 05/31/2005 Document Revised: 05/20/2011 Document Reviewed: 05/11/2008 [facility] Patient Information 2012 [place], [place].Foods Rich in Potassium Food / Potassium (mg)  Apricots, dried,  cup / 378 mg   Apricots, raw, 1 cup halves / 401 mg   Avocado,  / 487 mg   Banana, 1 large / 487 mg   Beef, lean, round, 3 oz / 202 mg   Cantaloupe, 1 cup cubes / 427 mg   Dates, medjool, 5 whole / 835 mg   Ham, cured, 3 oz / 212 mg   Lentils, dried,  cup / 458 mg   Lima beans, frozen,  cup / 258 mg   Orange, 1 large / 333 mg   Orange juice, 1 cup / 443 mg   Peaches, dried,  cup / 398 mg   Peas, split, cooked,  cup / 355 mg   Potato, boiled, 1 medium / 515 mg   Prunes, dried, uncooked,  cup / 318 mg   Raisins,  cup / 309 mg   Salmon, pink, raw, 3 oz / 275 mg   Sardines, canned , 3 oz / 338 mg   Tomato, raw, 1 medium / 292 mg   Tomato juice, 6 oz / 417 mg   [place], 3 oz / 349 mg  Document Released: 05/31/2005 Document Revised: 02/10/2011 Document Reviewed: 10/14/2008 [facility] Patient Information 2012 [place], [place].Foods Rich in Potassium Food / Potassium (mg)  Apricots, dried,  cup / 378 mg   Apricots, raw, 1 cup halves / 401 mg   Avocado,  / 487 mg   Banana, 1 large / 487 mg   Beef, lean, round, 3 oz / 202 mg   Cantaloupe, 1 cup cubes / 427 mg   Dates, medjool, 5 whole / 835 mg   Ham, cured, 3 oz / 212 mg   Lentils, dried,  cup / 458 mg   Lima beans, frozen,  cup / 258 mg   Orange, 1 large / 333 mg   Orange juice, 1 cup / 443 mg    Peaches, dried,  cup / 398 mg   Peas, split, cooked,  cup / 355 mg   Potato, boiled, 1 medium / 515 mg   Prunes, dried, uncooked,  cup / 318 mg   Raisins,  cup / 309 mg   Salmon, pink, raw, 3 oz / 275 mg   Sardines, canned , 3 oz / 338 mg   Tomato, raw, 1 medium / 292 mg   Tomato juice, 6 oz / 417 mg   [place], 3 oz / 349 mg  Document Released: 05/31/2005 Document Revised: 02/10/2011 Document Reviewed: 10/14/2008 [facility] Patient Information 2012 [place], [place].  If you have no primary doctor, here are some resources that may be helpful:  Medicaid-accepting [facility] Providers:   - [facility]- [address]      [phone]      Mon-Fri 9am-7pm, Sat 9am-1pm   - [facility]- [address] [place], [place]      [phone]   - [facility]- [address], [address] [place]      [phone]   [facility]- [address]      [phone]   - Renaye Rakers- [address] [place], [address]      [phone]      Only accepts [place] patients       after they have her name applied to their card   Self Pay (no insurance) in [place]:   - Sickle Cell Patients: Dr Willey Blade, [facility] Internal Medicine      [address] [place]      [phone]   - [phone]   - Physician Referral Service- [phone]   - [facility]- [address] [place]      [phone]   [facility]- [address]   - [facility]- see information above      (Speak to [employer] if you do not have insurance)   - [facility]- [address] [place]      [phone]   - [facility] [place]- [address]      [phone]   - [facility]- [address]      [phone]   - Dr Julio Sicks-  [address], [place]      [phone]   - [facility]- [address]      [phone]   - [facility]- [address]      [phone]      Also [address]      [phone]   - [facility]- [address]      [phone]      1st and 3rd Saturday every month, 10am-1pm Other agencies that provide inexpensive medical care:    [facility]  [phone]    [facility]  [phone]    [facility]  [phone]    [facility]  [phone]    [facility]  [phone]  General Information: Finding a doctor when you do not have health insurance can be tricky. Although you are not limited by an insurance plan, you are of course limited by her finances and how much but he can pay out of pocket.  What are your options if you don't have health insurance?   1) Find a Librarian, academic and Pay Out of Pocket Although you won't have to find out who is covered by your insurance plan, it is a good idea to ask around and get recommendations. You will then need to call the office and see if the doctor you have chosen will accept you as a new patient and what types of options they offer for patients who are self-pay. Some doctors offer discounts or will set up payment plans for their patients who do not have insurance, but you will need to ask so you aren't surprised when you get to your appointment.  2) Contact Your Local Health Department Not all health departments have doctors that can see patients for sick visits, but many do, so it is worth a call to see if yours does. If you don't know where your local health department is, you can check in your phone book. The CDC also has a tool to help you locate your state's health department, and many state websites also have listings of all of their local health departments.  3) Find a Walk-in Clinic If your illness is not likely to be very severe or complicated, you may want to try a walk in clinic. These are popping up all over the country in pharmacies, drugstores, and shopping centers. They're  usually staffed by nurse practitioners or physician assistants that have been trained to treat common illnesses and complaints. They're usually fairly quick and inexpensive. However, if you have serious medical issues or chronic medical problems, these are probably not your best option  RESOURCE GUIDE  Dental Problems  Patients with Medicaid: [facility] [phone] [address]                                           [phone] [address] [employer] Phone:  [phone]                                                  Phone:  [phone]  If unable to pay or uninsured, contact:  [facility] or [facility] to become qualified for the adult dental clinic.  Chronic Pain Problems Contact [facility]  [phone] Patients need to be referred by their primary care doctor.  Insufficient Money for Medicine Contact United Way:  call "211" or [facility] [phone].  No Primary Care Doctor Call [facility]  [phone] Other agencies that provide inexpensive medical care    [facility]  [phone]    [facility]  [phone]    [facility]  [phone]    [facility]  [phone]    [facility]  [phone]    [facility]  [phone]  Psychological Services [facility] Behavioral Health  [phone] [facility] Services  [phone] [facility] Mental Health   [phone] (emergency services [phone])  Substance Abuse Resources [facility]  [phone] [facility] [phone] [facility] [place] [phone] Floydene Flock [phone] Residential & Outpatient Substance Abuse Program  [phone]  Abuse/Neglect [facility] Child Abuse Hotline [phone] [facility] Child Abuse Hotline [phone] (After Hours)  Emergency Shelter [facility] [phone]  Maternity Homes Room at the [place] [facility] [place] [phone] [place] Alert Services [phone]  MRSA Hotline #:   702-390-2955    [facility] Resources  [facility]     United Way                          [facility] Dept. [address] [place] Phone:  [phone]                                   Phone:  [phone]                 Phone:  [place] Phone:  [place] [phone] (After Hours)

## 2011-10-13 NOTE — ED Notes (Signed)
Pt reports having a hx of acid reflux and having an increase in epigastric pain over the past several days.  Pt has hx of anxiety and thinks that her anxiety has gotten much worse.  Reports tenderness on palpation of her epigastric region.  Pt also reports having green BM over the past several days.  Pt reports eating vegetable beef soup and then vomiting what looked like blood in her vomit afterwards.  No distress noted.  Pt appears very anxious.

## 2011-12-04 ENCOUNTER — Encounter (HOSPITAL_COMMUNITY): Payer: Self-pay

## 2011-12-04 ENCOUNTER — Emergency Department (HOSPITAL_COMMUNITY)
Admission: EM | Admit: 2011-12-04 | Discharge: 2011-12-04 | Disposition: A | Discharge status: Home / Self Care | Payer: Self-pay | Attending: Emergency Medicine | Admitting: Emergency Medicine

## 2011-12-04 DIAGNOSIS — F419 Anxiety disorder, unspecified: Secondary | ICD-10-CM

## 2011-12-04 DIAGNOSIS — K219 Gastro-esophageal reflux disease without esophagitis: Secondary | ICD-10-CM

## 2011-12-04 DIAGNOSIS — R109 Unspecified abdominal pain: Secondary | ICD-10-CM | POA: Insufficient documentation

## 2011-12-04 DIAGNOSIS — Z79899 Other long term (current) drug therapy: Secondary | ICD-10-CM | POA: Insufficient documentation

## 2011-12-04 DIAGNOSIS — R112 Nausea with vomiting, unspecified: Secondary | ICD-10-CM | POA: Insufficient documentation

## 2011-12-04 DIAGNOSIS — R197 Diarrhea, unspecified: Secondary | ICD-10-CM | POA: Insufficient documentation

## 2011-12-04 DIAGNOSIS — F411 Generalized anxiety disorder: Secondary | ICD-10-CM | POA: Insufficient documentation

## 2011-12-04 LAB — URINE MICROSCOPIC-ADD ON

## 2011-12-04 LAB — COMPREHENSIVE METABOLIC PANEL
ALT: 20 U/L (ref 0–35)
AST: 23 U/L (ref 0–37)
Albumin: 3.3 g/dL — ABNORMAL LOW (ref 3.5–5.2)
CO2: 24 mEq/L (ref 19–32)
Calcium: 8.9 mg/dL (ref 8.4–10.5)
Chloride: 101 mEq/L (ref 96–112)
GFR calc non Af Amer: >90 mL/min (ref >90)
Sodium: 136 mEq/L (ref 135–145)
Total Bilirubin: 0.4 mg/dL (ref 0.3–1.2)

## 2011-12-04 LAB — DIFFERENTIAL
Basophils Absolute: 0 10*3/uL (ref 0.0–0.1)
Basophils Relative: 0 % (ref 0–1)
Lymphocytes Relative: 12 % (ref 12–46)
Neutro Abs: 8.2 10*3/uL — ABNORMAL HIGH (ref 1.7–7.7)

## 2011-12-04 LAB — CBC
MCHC: 34.9 g/dL (ref 30.0–36.0)
Platelets: 206 10*3/uL (ref 150–400)
RDW: 13.4 % (ref 11.5–15.5)
WBC: 10.4 10*3/uL (ref 4.0–10.5)

## 2011-12-04 LAB — URINALYSIS, ROUTINE W REFLEX MICROSCOPIC
Glucose, UA: NEGATIVE mg/dL
Hgb urine dipstick: NEGATIVE
Protein, ur: 30 mg/dL — AB
pH: 8 (ref 5.0–8.0)

## 2011-12-04 MED ORDER — PANTOPRAZOLE SODIUM 40 MG IV SOLR
40.0000 mg | Freq: Once | INTRAVENOUS | Status: AC
  Administered 2011-12-04: 40 mg via INTRAVENOUS
  Filled 2011-12-04: qty 40

## 2011-12-04 MED ORDER — ONDANSETRON HCL 4 MG/2ML IJ SOLN
4.0000 mg | Freq: Once | INTRAMUSCULAR | Status: AC
  Administered 2011-12-04: 4 mg via INTRAVENOUS
  Filled 2011-12-04: qty 2

## 2011-12-04 MED ORDER — ALPRAZOLAM 0.5 MG PO TABS: 0.5000 mg | ORAL_TABLET | Freq: Every evening | ORAL | Status: AC | PRN

## 2011-12-04 MED ORDER — POTASSIUM CHLORIDE CRYS ER 20 MEQ PO TBCR
40.0000 meq | EXTENDED_RELEASE_TABLET | Freq: Once | ORAL | Status: AC
  Administered 2011-12-04: 40 meq via ORAL
  Filled 2011-12-04: qty 1

## 2011-12-04 MED ORDER — ONDANSETRON HCL 4 MG PO TABS: 4.0000 mg | ORAL_TABLET | Freq: Four times a day (QID) | ORAL | Status: AC

## 2011-12-04 MED ORDER — SODIUM CHLORIDE 0.9 % IV BOLUS (SEPSIS)
1000.0000 mL | Freq: Once | INTRAVENOUS | Status: AC
  Administered 2011-12-04: 1000 mL via INTRAVENOUS

## 2011-12-04 MED ORDER — LORAZEPAM 2 MG/ML IJ SOLN
1.0000 mg | Freq: Once | INTRAMUSCULAR | Status: AC
  Administered 2011-12-04: 1 mg via INTRAVENOUS
  Filled 2011-12-04: qty 1

## 2011-12-04 MED ORDER — ESOMEPRAZOLE MAGNESIUM 40 MG PO CPDR: 40.0000 mg | DELAYED_RELEASE_CAPSULE | Freq: Every day | ORAL | Status: DC

## 2011-12-04 MED ORDER — GI COCKTAIL ~~LOC~~
30.0000 mL | Freq: Once | ORAL | Status: AC
  Administered 2011-12-04: 30 mL via ORAL
  Filled 2011-12-04: qty 30

## 2011-12-04 NOTE — Discharge Instructions (Signed)
Anxiety and Panic Attacks Your caregiver has informed you that you are having an anxiety or panic attack. There may be many forms of this. Most of the time these attacks come suddenly and without warning. They come at any time of day, including periods of sleep, and at any time of life. They may be strong and unexplained. Although panic attacks are very scary, they are physically harmless. Sometimes the cause of your anxiety is not known. Anxiety is a protective mechanism of the body in its fight or flight mechanism. Most of these perceived danger situations are actually nonphysical situations (such as anxiety over losing a job). CAUSES  The causes of an anxiety or panic attack are many. Panic attacks may occur in otherwise healthy people given a certain set of circumstances. There may be a genetic cause for panic attacks. Some medications may also have anxiety as a side effect. SYMPTOMS  Some of the most common feelings are:  Intense terror.   Dizziness, feeling faint.   Hot and cold flashes.   Fear of going crazy.   Feelings that nothing is real.   Sweating.   Shaking.   Chest pain or a fast heartbeat (palpitations).   Smothering, choking sensations.   Feelings of impending doom and that death is near.   Tingling of extremities, this may be from over-breathing.   Altered reality (derealization).   Being detached from yourself (depersonalization).  Several symptoms can be present to make up anxiety or panic attacks. DIAGNOSIS  The evaluation by your caregiver will depend on the type of symptoms you are experiencing. The diagnosis of anxiety or panic attack is made when no physical illness can be determined to be a cause of the symptoms. TREATMENT  Treatment to prevent anxiety and panic attacks may include:  Avoidance of circumstances that cause anxiety.   Reassurance and relaxation.   Regular exercise.   Relaxation therapies, such as yoga.   Psychotherapy with a  psychiatrist or therapist.   Avoidance of caffeine, alcohol and illegal drugs.   Prescribed medication.  SEEK IMMEDIATE MEDICAL CARE IF:   You experience panic attack symptoms that are different than your usual symptoms.   You have any worsening or concerning symptoms.  Document Released: 05/31/2005 Document Revised: 05/20/2011 Document Reviewed: 10/02/2009 [facility] Patient Information 2012 [place], [place].  Gastroesophageal Reflux Disease, Adult Gastroesophageal reflux disease (GERD) happens when acid from your stomach goes into your food pipe (esophagus). The acid can cause a burning feeling in your chest. Over time, the acid can make small holes (ulcers) in your food pipe.  HOME CARE  Ask your doctor for advice about:   Losing weight.   Quitting smoking.   Alcohol use.   Avoid foods and drinks that make your problems worse. You may want to avoid:   Caffeine and alcohol.   Chocolate.   Mints.   Garlic and onions.   Spicy foods.   Citrus fruits, such as oranges, lemons, or limes.   Foods that contain tomato, such as sauce, chili, salsa, and pizza.   Fried and fatty foods.   Avoid lying down for 3 hours before you go to bed or before you take a nap.   Eat small meals often, instead of large meals.   Wear loose-fitting clothing. Do not wear anything tight around your waist.   Raise (elevate) the head of your bed 6 to 8 inches with wood blocks. Using extra pillows does not help.   Only take medicines as told by  your doctor.   Do not take aspirin or ibuprofen.  GET HELP RIGHT AWAY IF:   You have pain in your arms, neck, jaw, teeth, or back.   Your pain gets worse or changes.   You feel sick to your stomach (nauseous), throw up (vomit), or sweat (diaphoresis).   You feel short of breath, or you pass out (faint).   Your throw up is green, yellow, black, or looks like coffee grounds or blood.   Your poop (stool) is red, bloody, or black.  MAKE SURE YOU:     Understand these instructions.   Will watch your condition.   Will get help right away if you are not doing well or get worse.  Document Released: 11/17/2007 Document Revised: 05/20/2011 Document Reviewed: 12/18/2010 [facility] Patient Information 2012 [place], [place].

## 2011-12-04 NOTE — ED Notes (Signed)
Patient is alert and oriented x3.  She was given DC instructions and follow up visit instructions.  Patient gave verbal understanding. She was DC ambulatory under his own power to home.  V/S stable.  He was not showing any signs of distress on DC 

## 2011-12-04 NOTE — ED Notes (Signed)
Patient is alert and oriented x3.  She is complaining of abdominal pain that started two days ago That she rates 10 of 10 with nausea, vomiting and diarrhea.  She has a history of this issue. She denies any blood in stool

## 2011-12-04 NOTE — ED Notes (Signed)
Pt in from home with N/V x2 weeks and epigastric pain states hx of acid reflux states this has been ongoing since childhood states sx increased last night states she feels dehydrated

## 2011-12-04 NOTE — ED Provider Notes (Signed)
History     CSN: [id number]  Arrival date & time 12/04/11  4098   First MD Initiated Contact with Patient 12/04/11 [phone]      Chief Complaint  Patient presents with  . Nausea  . Emesis    (Consider location/radiation/quality/duration/timing/severity/associated sxs/prior treatment) HPI Pt has history of GERD and anxiety and states the two have been exacerbated over the last 24 hours. Pt states she has vomited 30 times with multiple episodes of diarrhea. No blood in either. No sick contacts. Pt has never seen a gastroenterologist. She recently lost her job and had her house foreclosed.  Past Medical History  Diagnosis Date  . GERD (gastroesophageal reflux disease)   . Anxiety   . Depression     Past Surgical History  Procedure Date  . Appendectomy     No family history on file.  History  Substance Use Topics  . Smoking status: Current Everyday Smoker -- 0.5 packs/day    Types: Cigarettes  . Smokeless tobacco: Not on file  . Alcohol Use: Yes    OB History    Grav Para Term Preterm Abortions TAB SAB Ect Mult Living                  Review of Systems  Constitutional: Negative for fever and chills.  Respiratory: Negative for shortness of breath.   Cardiovascular: Negative for chest pain.  Gastrointestinal: Positive for nausea, vomiting, abdominal pain and diarrhea. Negative for constipation and blood in stool.  Skin: Negative for rash and wound.  Neurological: Negative for dizziness, weakness, numbness and headaches.  Psychiatric/Behavioral: The patient is nervous/anxious.     Allergies  Review of patient's allergies indicates no known allergies.  Home Medications   Current Outpatient Rx  Name Route Sig Dispense Refill  . ALPRAZOLAM 1 MG PO TABS Oral Take 1 mg by mouth every 4 (four) hours as needed. For anxiety    . ESOMEPRAZOLE MAGNESIUM 20 MG PO CPDR Oral Take 20 mg by mouth daily before breakfast.    . FLUOXETINE HCL 40 MG PO CAPS Oral Take 40 mg by mouth  daily.    . ADULT MULTIVITAMIN W/MINERALS CH Oral Take 1 tablet by mouth daily.    Marland Kitchen OMEPRAZOLE 20 MG PO CPDR Oral Take 20 mg by mouth daily.    Marland Kitchen ALPRAZOLAM 0.5 MG PO TABS Oral Take 1 tablet (0.5 mg total) by mouth at bedtime as needed for sleep. 15 tablet 0  . ESOMEPRAZOLE MAGNESIUM 40 MG PO CPDR Oral Take 1 capsule (40 mg total) by mouth daily. 30 capsule 0  . ONDANSETRON HCL 4 MG PO TABS Oral Take 1 tablet (4 mg total) by mouth every 6 (six) hours. 12 tablet 0    Oral dissolving tabs    BP 122/76  Pulse 93  Temp 98.2 F (36.8 C) (Oral)  Resp 18  SpO2 99%  Physical Exam  Nursing note and vitals reviewed. Constitutional: She is oriented to person, place, and time. She appears well-developed and well-nourished. No distress.       Pt is crying and anxious  HENT:  Head: Normocephalic and atraumatic.  Mouth/Throat: Oropharynx is clear and moist.  Eyes: EOM are normal. Pupils are equal, round, and reactive to light.  Neck: Normal range of motion. Neck supple.  Cardiovascular: Normal rate and regular rhythm.   Pulmonary/Chest: Effort normal and breath sounds normal. No respiratory distress. She has no wheezes. She has no rales.  Abdominal: Soft. Bowel sounds are normal. She  exhibits no distension and no mass. There is tenderness (mild epigastric TTP. ). There is no rebound and no guarding.  Musculoskeletal: Normal range of motion. She exhibits no edema and no tenderness.  Neurological: She is alert and oriented to person, place, and time.       5/5 motor, sensation intact  Skin: Skin is warm and dry. No rash noted. No erythema.  Psychiatric: Her mood appears anxious. Her affect is labile.    ED Course  Procedures (including critical care time)  Labs Reviewed  CBC - Abnormal; Notable for the following:    Hemoglobin 15.4 (*)     All other components within normal limits  DIFFERENTIAL - Abnormal; Notable for the following:    Neutrophils Relative 79 (*)     Neutro Abs 8.2 (*)       All other components within normal limits  COMPREHENSIVE METABOLIC PANEL - Abnormal; Notable for the following:    Potassium 3.0 (*)     Glucose, Bld 106 (*)     Albumin 3.3 (*)     All other components within normal limits  URINALYSIS, ROUTINE W REFLEX MICROSCOPIC - Abnormal; Notable for the following:    Color, Urine AMBER (*)  BIOCHEMICALS MAY BE AFFECTED BY COLOR   APPearance CLOUDY (*)     Bilirubin Urine SMALL (*)     Ketones, ur 15 (*)     Protein, ur 30 (*)     Leukocytes, UA TRACE (*)     All other components within normal limits  URINE MICROSCOPIC-ADD ON - Abnormal; Notable for the following:    Casts HYALINE CASTS (*)     All other components within normal limits  LIPASE, BLOOD   No results found.   1. GERD (gastroesophageal reflux disease)   2. Anxiety       MDM  Pt states she is feeling much better.         Loren Racer, MD 12/04/11 1314

## 2012-02-01 ENCOUNTER — Encounter: Payer: Self-pay | Admitting: Family Medicine

## 2012-02-01 ENCOUNTER — Ambulatory Visit (INDEPENDENT_AMBULATORY_CARE_PROVIDER_SITE_OTHER): Payer: Self-pay | Admitting: Family Medicine

## 2012-02-01 VITALS — BP 130/82 | HR 82 | Temp 97.8°F | Ht 67.0 in | Wt 142.0 lb

## 2012-02-01 DIAGNOSIS — R112 Nausea with vomiting, unspecified: Secondary | ICD-10-CM

## 2012-02-01 DIAGNOSIS — F32A Depression, unspecified: Secondary | ICD-10-CM

## 2012-02-01 DIAGNOSIS — K219 Gastro-esophageal reflux disease without esophagitis: Secondary | ICD-10-CM

## 2012-02-01 DIAGNOSIS — F3289 Other specified depressive episodes: Secondary | ICD-10-CM

## 2012-02-01 DIAGNOSIS — F329 Major depressive disorder, single episode, unspecified: Secondary | ICD-10-CM

## 2012-02-01 DIAGNOSIS — Z0000 Encounter for general adult medical examination without abnormal findings: Secondary | ICD-10-CM

## 2012-02-01 DIAGNOSIS — Z1231 Encounter for screening mammogram for malignant neoplasm of breast: Secondary | ICD-10-CM | POA: Insufficient documentation

## 2012-02-01 MED ORDER — OMEPRAZOLE 40 MG PO CPDR: 40.0000 mg | DELAYED_RELEASE_CAPSULE | Freq: Every day | ORAL | Status: DC

## 2012-02-01 NOTE — Assessment & Plan Note (Signed)
Needs mammogram and pap smear, last ones were both in 2008. Will order after she obtains [employer].  She is going for that today.

## 2012-02-01 NOTE — Progress Notes (Signed)
Patient ID: Karen Casey, female   DOB: [birth year], 56 y.o.   MRN: [id number] Karen Casey is a 56 y.o. female who presents to [facility] today to establish care.  Previously seen at [facility] for walk-in issues :    1.  Persistent vomiting:  Lost job due to this, frequent days off of work.  States she throws up "18 hours at a time."  Has also missed several days at a time due to frequent vomiting. She states this is worsened by stress. She does describe some epigastric and left upper quadrant abdominal pain as well. She can go several days to even a week or so between vomiting episodes but then stress or abdominal pain or trigger this. Describes vomitus is nonbloody and nonbilious. Mostly water or mucus. No history of EGD or colonoscopy. Please see GERD below.       2.  Depression and anxiety:  Seen by a psychiatrist who prescribes Prozac and Xanax.  Has been on these for years.  Stresses at work trigger her anxiety.  Now endorses decreased interest in activities, increased periods of crying, decreased hunger and interest in food.  No suicidal or homicidal ideatinos.   3.  GERD:  Present since a teenager.  Does endorse epigastric abdominal pain that is constant in nature.  Also endorses burning sensation after meals in throat and chest.  Also endorses brash.  Taking Cimetidine without relief.  Tries OTC Tylenol but not much relief.  Endorses 70 lb weight loss in 8 months prior to stopping work.  Baseline now seems to be around 130 lbs since losing her job, has actually gained 12 lbs since losing job.    Patient lost job last September.  Has not been working since then.  Worked for past 25 years prior to this at [employer] and [employer].  States that all she wants to do to start working again. She feels worthless not contributing.Feels that she cannot start working until she gets her abdominal issues worked out.   The following portions of the patient's history were reviewed and updated  as appropriate: allergies, current medications, past medical history, family and social history, and problem list.  Patient is a nonsmoker.  Past Medical History  Diagnosis Date  . GERD (gastroesophageal reflux disease)   . Anxiety   . Depression     ROS as above otherwise neg. No Chest pain, palpitations, SOB, Fever, Chills, Abd pain, N/V/D.  Medications reviewed. Current Outpatient Prescriptions  Medication Sig Dispense Refill  . ALPRAZolam (XANAX) 1 MG tablet Take 1 mg by mouth every 4 (four) hours as needed. For anxiety      . FLUoxetine (PROZAC) 40 MG capsule Take 40 mg by mouth daily.      . Multiple Vitamin (MULITIVITAMIN WITH MINERALS) TABS Take 1 tablet by mouth daily.      Marland Kitchen omeprazole (PRILOSEC) 20 MG capsule Take 20 mg by mouth daily.        Exam:  BP 130/82  Pulse 82  Temp 97.8 F (36.6 C) (Oral)  Ht 5\' 7"  (1.702 m)  Wt 142 lb (64.411 kg)  BMI 22.24 kg/m2 Gen: Well NAD.  Tearful at times especially when discussing losing her job. HEENT:  [place]/AT.  EOMI, PERRL.  MMM, tonsils non-erythematous, non-edematous.  External ears WNL, Bilateral TM's normal without retraction, redness or bulging.  Lungs: CTABL Nl WOB Heart: RRR no MRG Abd: Soft and nondistended. Tender to palpation in epigastrium. No guarding or rebound.  Good bowel sounds throughout. Otherwise abdomen nontender. Exts: Non edematous BL  LE, warm and well perfused.   No results found for this or any previous visit (from the past 72 hour(s)).

## 2012-02-01 NOTE — Patient Instructions (Addendum)
I will send in a prescription strength medicine for your stomach.   It is called Prilosec 40 mg.  Over the counter is 20. If the prescription is too expensive, but it over the counter and take 2 of them.    We will refer you to [facility] for the gastroenterologist because of how long this problem has been going on.

## 2012-02-04 DIAGNOSIS — K219 Gastro-esophageal reflux disease without esophagitis: Secondary | ICD-10-CM | POA: Insufficient documentation

## 2012-02-04 DIAGNOSIS — R112 Nausea with vomiting, unspecified: Secondary | ICD-10-CM | POA: Insufficient documentation

## 2012-02-04 DIAGNOSIS — F32A Depression, unspecified: Secondary | ICD-10-CM | POA: Insufficient documentation

## 2012-02-04 DIAGNOSIS — F418 Other specified anxiety disorders: Secondary | ICD-10-CM | POA: Insufficient documentation

## 2012-02-04 DIAGNOSIS — K529 Noninfective gastroenteritis and colitis, unspecified: Secondary | ICD-10-CM | POA: Insufficient documentation

## 2012-02-04 DIAGNOSIS — F329 Major depressive disorder, single episode, unspecified: Secondary | ICD-10-CM | POA: Insufficient documentation

## 2012-02-04 NOTE — Assessment & Plan Note (Signed)
Seems to be related to losing her job. She feels that when she starts working in this will take care of itself. She's never had difficulty with depression in the past. She did not want to start any medications or treatment for this. Her goal is to work out her abdominal pain issues and get back to work. Did introduce the idea that perhaps nerves are contributing to her abdominal pain. We'll need to gently reinforce this at future visits as well.

## 2012-02-04 NOTE — Assessment & Plan Note (Signed)
She was only taking cimetidine in the past. We'll start her on PPI today. Recommended she followup with gastroenterologist. We will place order for that today. She has difficulty with finances and therefore we'll need to refer her to [facility]. Does have some worrisome symptoms including weight loss and epigastric pain.

## 2012-02-04 NOTE — Assessment & Plan Note (Signed)
Chronic issue for patient. Seems to be related to chronic GERD. Worsened in the past 2 years to the point where she lost her job because of how much work she was missing.

## 2012-02-17 ENCOUNTER — Encounter: Payer: Self-pay | Admitting: Family Medicine

## 2012-02-17 ENCOUNTER — Ambulatory Visit (INDEPENDENT_AMBULATORY_CARE_PROVIDER_SITE_OTHER): Payer: Self-pay | Admitting: Family Medicine

## 2012-02-17 VITALS — BP 127/94 | HR 107 | Temp 98.9°F | Ht 67.0 in | Wt 144.3 lb

## 2012-02-17 DIAGNOSIS — F329 Major depressive disorder, single episode, unspecified: Secondary | ICD-10-CM

## 2012-02-17 DIAGNOSIS — F3289 Other specified depressive episodes: Secondary | ICD-10-CM

## 2012-02-17 DIAGNOSIS — K219 Gastro-esophageal reflux disease without esophagitis: Secondary | ICD-10-CM

## 2012-02-17 DIAGNOSIS — F17200 Nicotine dependence, unspecified, uncomplicated: Secondary | ICD-10-CM

## 2012-02-17 DIAGNOSIS — F32A Depression, unspecified: Secondary | ICD-10-CM

## 2012-02-17 DIAGNOSIS — R112 Nausea with vomiting, unspecified: Secondary | ICD-10-CM

## 2012-02-17 DIAGNOSIS — Z0000 Encounter for general adult medical examination without abnormal findings: Secondary | ICD-10-CM

## 2012-02-17 DIAGNOSIS — R110 Nausea: Secondary | ICD-10-CM

## 2012-02-17 DIAGNOSIS — H269 Unspecified cataract: Secondary | ICD-10-CM | POA: Insufficient documentation

## 2012-02-17 DIAGNOSIS — Z720 Tobacco use: Secondary | ICD-10-CM | POA: Insufficient documentation

## 2012-02-17 LAB — CBC
HCT: 46.3 % — ABNORMAL HIGH (ref 36.0–46.0)
MCH: 31.3 pg (ref 26.0–34.0)
MCHC: 33.7 g/dL (ref 30.0–36.0)
MCV: 92.8 fL (ref 78.0–100.0)
Platelets: 215 10*3/uL (ref 150–400)
RDW: 14.5 % (ref 11.5–15.5)

## 2012-02-17 NOTE — Assessment & Plan Note (Signed)
Referral to [facility] placed today.

## 2012-02-17 NOTE — Patient Instructions (Addendum)
I will send you a letter with the test results.  If anything's abnormal I'll call.  Call the number we give you to set up a mammogram when you want.  Scheduled a follow-up for your pap smear when it's best for you.    We'll get you set up with [facility].

## 2012-02-17 NOTE — Assessment & Plan Note (Signed)
Order for mammogram placed today.  Also with CMET and CBC today. She is to reschedule Pap smear as she does not desire to do this today.

## 2012-02-17 NOTE — Progress Notes (Signed)
Patient ID: Karen Casey, female   DOB: Nov 02, 1955, 56 y.o.   MRN: [id number] Karen Casey is a 56 y.o. female who presents to [facility] today for follow-up from previous visit:  1.  GERD:  Much improved.  No longer vomiting, states she has not vomited for about 3 weeks and this is the first time in years that she has gone more than a few days without vomiting.  Still with some mild epigastric abdominal pain, some nausea on most days.  No melenea or hematochezia.  Eating well.  Has gained 2 lbs in a week.  Taking 40 mg of Prilosec.    2.  Cataract:  Feels she could be able to work and drive to work if this was fixed.  Desires referral.  Inability to see much more than blur out of Right eye.  Present for years.  No injury to eye.   3.  Anxiety and depression:  Not improved, however this is followed by psychiatry.  States she is stable currently on Xanax and Prozac.  No SI/HI.   The following portions of the patient's history were reviewed and updated as appropriate: allergies, current medications, past medical history, family and social history, and problem list.  Patient is a nonsmoker.  Past Medical History  Diagnosis Date  . GERD (gastroesophageal reflux disease)   . Anxiety   . Depression     ROS as above otherwise neg. No Chest pain, palpitations, SOB, Fever, Chills.  Medications reviewed. Current Outpatient Prescriptions  Medication Sig Dispense Refill  . ALPRAZolam (XANAX) 1 MG tablet Take 1 mg by mouth every 4 (four) hours as needed. For anxiety      . cimetidine (TAGAMET) 400 MG tablet Take 400 mg by mouth 2 (two) times daily.      Marland Kitchen FLUoxetine (PROZAC) 40 MG capsule Take 40 mg by mouth daily.      . Multiple Vitamin (MULITIVITAMIN WITH MINERALS) TABS Take 1 tablet by mouth daily.      Marland Kitchen omeprazole (PRILOSEC) 40 MG capsule Take 1 capsule (40 mg total) by mouth daily.  30 capsule  3    Exam:  BP 127/94  Pulse 107  Temp 98.9 F (37.2 C) (Oral)  Ht 5\' 7"  (1.702 m)  Wt 144 lb  4.8 oz (65.454 kg)  BMI 22.60 kg/m2 Gen: Well NAD Head:  [place]/AT Eyes:  EOMI, PERRL.  Right eye with cortical cataract noted.  Left eye without cataract Lungs: Mild wheezing at bases BL Heart: RRR no MRG Abd: Soft/ND.  Tender to palpation LUQ and epigastrum.  Good bowel sounds throughout Exts: Non edematous BL  LE, warm and well perfused.  Psych:  Somewhat tearful.  Not anxious appearing.  Linear thought process.    No results found for this or any previous visit (from the past 72 hour(s)).

## 2012-02-17 NOTE — Assessment & Plan Note (Signed)
Much improved.   She is to continue with her Prilosec.  She still desires referral to GI and has appt at [facility] in October.

## 2012-02-17 NOTE — Assessment & Plan Note (Signed)
Stable on current regimen   

## 2012-02-17 NOTE — Assessment & Plan Note (Signed)
Improved with Prilosec.  Continue at prescription dosing.

## 2012-02-17 NOTE — Assessment & Plan Note (Signed)
Counseled to quit. Feels she will be more ready to quit once her depression is under control and feels this will be better when her cataract has been corrected.

## 2012-02-18 LAB — COMPREHENSIVE METABOLIC PANEL
ALT: 30 U/L (ref 0–35)
AST: 42 U/L — ABNORMAL HIGH (ref 0–37)
Albumin: 3.9 g/dL (ref 3.5–5.2)
Alkaline Phosphatase: 76 U/L (ref 39–117)
Glucose, Bld: 82 mg/dL (ref 70–99)
Potassium: 4.4 mEq/L (ref 3.5–5.3)
Sodium: 139 mEq/L (ref 135–145)
Total Bilirubin: 0.7 mg/dL (ref 0.3–1.2)
Total Protein: 6.3 g/dL (ref 6.0–8.3)

## 2012-02-21 ENCOUNTER — Telehealth: Payer: Self-pay | Admitting: Family Medicine

## 2012-02-21 ENCOUNTER — Encounter: Payer: Self-pay | Admitting: Family Medicine

## 2012-02-21 NOTE — Telephone Encounter (Signed)
Patient seems very confused.  She got a call from [facility] about seeing the doctor for her cataracts and the appt is at the same time that she is to be seen here on Thursday.  She had to call and reschedule the appt with that MD, but is confused about how she is supposed to get there and come up with the $150.  She said that she would get someone in her family to help her figure this out but I though I should pass this along as well.

## 2012-02-24 ENCOUNTER — Ambulatory Visit: Payer: Self-pay | Admitting: Family Medicine

## 2012-03-29 ENCOUNTER — Ambulatory Visit: Payer: Self-pay | Admitting: Family Medicine

## 2012-04-04 ENCOUNTER — Ambulatory Visit: Payer: Self-pay | Admitting: Family Medicine

## 2012-04-06 ENCOUNTER — Ambulatory Visit: Payer: Self-pay | Admitting: Family Medicine

## 2012-04-15 ENCOUNTER — Encounter (HOSPITAL_COMMUNITY): Payer: Self-pay | Admitting: *Deleted

## 2012-04-15 ENCOUNTER — Emergency Department (HOSPITAL_COMMUNITY): Payer: Self-pay

## 2012-04-15 ENCOUNTER — Emergency Department (HOSPITAL_COMMUNITY)
Admission: EM | Admit: 2012-04-15 | Discharge: 2012-04-15 | Disposition: A | Discharge status: Home / Self Care | Payer: Self-pay | Attending: Emergency Medicine | Admitting: Emergency Medicine

## 2012-04-15 DIAGNOSIS — W19XXXA Unspecified fall, initial encounter: Secondary | ICD-10-CM

## 2012-04-15 DIAGNOSIS — F329 Major depressive disorder, single episode, unspecified: Secondary | ICD-10-CM | POA: Insufficient documentation

## 2012-04-15 DIAGNOSIS — Y939 Activity, unspecified: Secondary | ICD-10-CM | POA: Insufficient documentation

## 2012-04-15 DIAGNOSIS — W010XXA Fall on same level from slipping, tripping and stumbling without subsequent striking against object, initial encounter: Secondary | ICD-10-CM | POA: Insufficient documentation

## 2012-04-15 DIAGNOSIS — F17200 Nicotine dependence, unspecified, uncomplicated: Secondary | ICD-10-CM | POA: Insufficient documentation

## 2012-04-15 DIAGNOSIS — Y92009 Unspecified place in unspecified non-institutional (private) residence as the place of occurrence of the external cause: Secondary | ICD-10-CM | POA: Insufficient documentation

## 2012-04-15 DIAGNOSIS — S0003XA Contusion of scalp, initial encounter: Secondary | ICD-10-CM | POA: Insufficient documentation

## 2012-04-15 DIAGNOSIS — R4182 Altered mental status, unspecified: Secondary | ICD-10-CM | POA: Insufficient documentation

## 2012-04-15 DIAGNOSIS — F411 Generalized anxiety disorder: Secondary | ICD-10-CM | POA: Insufficient documentation

## 2012-04-15 DIAGNOSIS — K219 Gastro-esophageal reflux disease without esophagitis: Secondary | ICD-10-CM | POA: Insufficient documentation

## 2012-04-15 DIAGNOSIS — S1093XA Contusion of unspecified part of neck, initial encounter: Secondary | ICD-10-CM | POA: Insufficient documentation

## 2012-04-15 DIAGNOSIS — Z79899 Other long term (current) drug therapy: Secondary | ICD-10-CM | POA: Insufficient documentation

## 2012-04-15 DIAGNOSIS — F3289 Other specified depressive episodes: Secondary | ICD-10-CM | POA: Insufficient documentation

## 2012-04-15 MED ORDER — ACETAMINOPHEN 325 MG PO TABS
650.0000 mg | ORAL_TABLET | Freq: Once | ORAL | Status: AC
  Administered 2012-04-15: 650 mg via ORAL
  Filled 2012-04-15: qty 2

## 2012-04-15 MED ORDER — LORAZEPAM 1 MG PO TABS: 1.0000 mg | ORAL_TABLET | Freq: Three times a day (TID) | ORAL | Status: DC | PRN

## 2012-04-15 MED ORDER — LORAZEPAM 2 MG/ML IJ SOLN
1.0000 mg | Freq: Once | INTRAMUSCULAR | Status: AC
  Administered 2012-04-15: 1 mg via INTRAVENOUS

## 2012-04-15 MED ORDER — LORAZEPAM 2 MG/ML IJ SOLN
INTRAMUSCULAR | Status: AC
  Filled 2012-04-15: qty 1

## 2012-04-15 NOTE — ED Notes (Signed)
ccollar removed per ERMD.  Patient up to bathroom,  Noted to be unsteady on her feet.  She states she is feeling better.  Family remains at bedside.

## 2012-04-15 NOTE — ED Notes (Signed)
Patient reportedly had a fall today while outside.  She denies having any dizziness prior to her fall.  She did have positive loc for 3 min.  Patient reported to be combative post fall.  Patient arrives alert and oriented.  She is nervous/shaky.  Patient states she has not had her xanax for 5 days by choice.  She is trying to come off of xanax.  She is doing this withdrawal w/o tapering

## 2012-04-15 NOTE — Progress Notes (Signed)
04/15/12 1200  Clinical Encounter Type  Visited With Patient and family together  Visit Type Trauma   I visited with the patient and family. I prayed with the patient's sister and daughter while patient was having tests done. Veryl Speak

## 2012-04-15 NOTE — ED Notes (Signed)
Vital signs stable. 

## 2012-04-15 NOTE — Progress Notes (Signed)
04/15/12 1200  Clinical Encounter Type  Visited With Patient and family together  Visit Type Trauma  Patient was released from hospital this afternoon. Veryl Speak

## 2012-04-15 NOTE — ED Notes (Signed)
Family at beside. Family given emotional support. 

## 2012-04-15 NOTE — ED Provider Notes (Addendum)
History     CSN: [id number]  Arrival date & time 04/15/12  1131   First MD Initiated Contact with Patient 04/15/12 1132     Chief complaint: slip, fall, head injury  (Consider location/radiation/quality/duration/timing/severity/associated sxs/prior treatment) Patient is a 56 y.o. female presenting with fall. The history is provided by the patient.  Fall Associated symptoms include headaches. Pertinent negatives include no fever, no numbness, no abdominal pain, no nausea and no vomiting.  s/p fall at home on wet deck this morning. Pt states slipped. No faintness or lightheadedness. Hit head. +loc ?2-3 minutes. ems notes on their arrival pt was confused, combative, more alert and cooperative in route. Post fall, pt c/o diffuse headache. No preceding headache, faintness or dizziness. Denies neck or back pain. No numbness/weakness. No nv. No abd pain. No cp or palpitations. No sob. Denies other injury. Pain is constant, dull, moderate, no specific exacerbating or alleviating factors.   Past Medical History  Diagnosis Date  . GERD (gastroesophageal reflux disease)   . Anxiety   . Depression     Past Surgical History  Procedure Date  . Appendectomy   . Left shoulder dislocation Sept 2011    Family History  Problem Relation Age of Onset  . Hypertension Mother   . Hyperlipidemia Mother   . Heart disease Father   . Depression Father   . Parkinsonism Father     History  Substance Use Topics  . Smoking status: Current Every Day Smoker -- 0.5 packs/day    Types: Cigarettes  . Smokeless tobacco: Not on file  . Alcohol Use: 0.6 oz/week    1 Cans of beer per week    OB History    Grav Para Term Preterm Abortions TAB SAB Ect Mult Living                  Review of Systems  Constitutional: Negative for fever and chills.  HENT: Negative for neck pain.   Eyes: Negative for pain and visual disturbance.  Respiratory: Negative for shortness of breath.   Cardiovascular: Negative for  chest pain.  Gastrointestinal: Negative for nausea, vomiting and abdominal pain.  Genitourinary: Negative for flank pain.  Musculoskeletal: Negative for back pain.  Skin: Negative for wound.  Neurological: Positive for headaches. Negative for weakness and numbness.  Hematological: Does not bruise/bleed easily.  Psychiatric/Behavioral: Negative for agitation.    Allergies  Review of patient's allergies indicates no known allergies.  Home Medications   Current Outpatient Rx  Name Route Sig Dispense Refill  . ALPRAZOLAM 1 MG PO TABS Oral Take 1 mg by mouth every 4 (four) hours as needed. For anxiety    . CIMETIDINE 400 MG PO TABS Oral Take 400 mg by mouth 2 (two) times daily.    Marland Kitchen FLUOXETINE HCL 40 MG PO CAPS Oral Take 40 mg by mouth daily.    . ADULT MULTIVITAMIN W/MINERALS CH Oral Take 1 tablet by mouth daily.    Marland Kitchen OMEPRAZOLE 40 MG PO CPDR Oral Take 1 capsule (40 mg total) by mouth daily. 30 capsule 3    There were no vitals taken for this visit.  Physical Exam  Nursing note and vitals reviewed. Constitutional: She is oriented to person, place, and time. She appears well-developed and well-nourished. No distress.  HENT:  Nose: Nose normal.  Mouth/Throat: Oropharynx is clear and moist.       No sinus or temporal tenderness. Contusion to r lateral scalp.   Eyes: Conjunctivae normal and EOM are  normal. Pupils are equal, round, and reactive to light. No scleral icterus.  Neck: Neck supple. No tracheal deviation present. No thyromegaly present.       No stiffness or rigidity.   Cardiovascular: Normal rate, regular rhythm, normal heart sounds and intact distal pulses.  Exam reveals no gallop and no friction rub.   No murmur heard. Pulmonary/Chest: Effort normal and breath sounds normal. No respiratory distress.  Abdominal: Soft. Normal appearance and bowel sounds are normal. She exhibits no distension. There is no tenderness.  Genitourinary:       No cva tenderness.    Musculoskeletal: Normal range of motion. She exhibits no edema and no tenderness.       Mid cervical tenderness, mild, remainder CTLS spine, non tender, aligned, no step off.   Neurological: She is alert and oriented to person, place, and time. No cranial nerve deficit.       Motor intact bilaterally. Steady gait.   Skin: Skin is warm and dry. No rash noted. She is not diaphoretic.  Psychiatric:       Anxious, mildly shaky.     ED Course  Procedures (including critical care time)  Results for orders placed in visit on 02/17/12  COMPREHENSIVE METABOLIC PANEL      Component Value Range   Sodium 139  135 - 145 mEq/L   Potassium 4.4  3.5 - 5.3 mEq/L   Chloride 102  96 - 112 mEq/L   CO2 27  19 - 32 mEq/L   Glucose, Bld 82  70 - 99 mg/dL   BUN 6  6 - 23 mg/dL   Creat 9.60  4.54 - 0.98 mg/dL   Total Bilirubin 0.7  0.3 - 1.2 mg/dL   Alkaline Phosphatase 76  39 - 117 U/L   AST 42 (*) 0 - 37 U/L   ALT 30  0 - 35 U/L   Total Protein 6.3  6.0 - 8.3 g/dL   Albumin 3.9  3.5 - 5.2 g/dL   Calcium 9.6  8.4 - 11.9 mg/dL  LDL CHOLESTEROL, DIRECT      Component Value Range   Direct LDL 43    CBC      Component Value Range   WBC 7.8  4.0 - 10.5 K/uL   RBC 4.99  3.87 - 5.11 MIL/uL   Hemoglobin 15.6 (*) 12.0 - 15.0 g/dL   HCT 14.7 (*) 82.9 - 56.2 %   MCV 92.8  78.0 - 100.0 fL   MCH 31.3  26.0 - 34.0 pg   MCHC 33.7  30.0 - 36.0 g/dL   RDW 13.0  86.5 - 78.4 %   Platelets 215  150 - 400 K/uL   Ct Head Wo Contrast  04/15/2012  *RADIOLOGY REPORT*  Clinical Data:  Status post fall  CT HEAD WITHOUT CONTRAST CT CERVICAL SPINE WITHOUT CONTRAST  Technique:  Multidetector CT imaging of the head and cervical spine was performed following the standard protocol without intravenous contrast.  Multiplanar CT image reconstructions of the cervical spine were also generated.  Comparison:   None  CT HEAD  Findings: There is prominence of the sulci and ventricles consistent with mild brain atrophy.The brain has  an otherwsie normal appearance without evidence for hemorrhage, infarction, hydrocephalus, or mass lesion.  There is no extra axial fluid collection.  The skull and paranasal sinuses are normal.   Large right frontoparietal scalp hematoma is identified.  This measures up to 1.5 cm in thickness, image 28.  IMPRESSION:  1.  No acute intracranial abnormalities. 2.  Right frontoparietal scalp hematoma.  CT CERVICAL SPINE  Findings: Normal alignment of the cervical spine.  Disc space narrowing and ventral spurring is noted at C5-6.  Vertebral body heights are well preserved.  No fractures or subluxations identified.  IMPRESSION:  1.  No acute findings. 2.  Degenerative disc disease.   Original Report Authenticated By: Signa Kell, M.D.    Ct Cervical Spine Wo Contrast  04/15/2012  *RADIOLOGY REPORT*  Clinical Data:  Status post fall  CT HEAD WITHOUT CONTRAST CT CERVICAL SPINE WITHOUT CONTRAST  Technique:  Multidetector CT imaging of the head and cervical spine was performed following the standard protocol without intravenous contrast.  Multiplanar CT image reconstructions of the cervical spine were also generated.  Comparison:   None  CT HEAD  Findings: There is prominence of the sulci and ventricles consistent with mild brain atrophy.The brain has an otherwsie normal appearance without evidence for hemorrhage, infarction, hydrocephalus, or mass lesion.  There is no extra axial fluid collection.  The skull and paranasal sinuses are normal.   Large right frontoparietal scalp hematoma is identified.  This measures up to 1.5 cm in thickness, image 28.  IMPRESSION:  1.  No acute intracranial abnormalities. 2.  Right frontoparietal scalp hematoma.  CT CERVICAL SPINE  Findings: Normal alignment of the cervical spine.  Disc space narrowing and ventral spurring is noted at C5-6.  Vertebral body heights are well preserved.  No fractures or subluxations identified.  IMPRESSION:  1.  No acute findings. 2.  Degenerative disc  disease.   Original Report Authenticated By: Signa Kell, M.D.        MDM  Iv ns. Ct.   Reviewed nursing notes and prior charts for additional history.   Pt initially anxious/shaky, states recently out of xanax.   Pt confirms mechanical slip/fall, denies faintness, denies seizure.   Ativan 1 mg iv.    Recheck spine nt. Pt calm, alert.   Pt/family present at fall, deny szs. No incont, no oral injury.  Pt/family does request bzd rx, states they will work to taper slowly off - I discussed this w pt and family, as well as close pcp f/u.      Suzi Roots, MD 04/15/12 1254  Suzi Roots, MD 04/15/12 1340

## 2012-04-15 NOTE — ED Notes (Signed)
Patient remains alert and oriented.  She states she is feeling better.  Patient has complaints of posterior head pain.  Wants the collar removed.  Awaiting CT results.  Patient family remains at bedside.

## 2012-04-18 ENCOUNTER — Ambulatory Visit (INDEPENDENT_AMBULATORY_CARE_PROVIDER_SITE_OTHER): Payer: Self-pay | Admitting: Family Medicine

## 2012-04-18 ENCOUNTER — Encounter: Payer: Self-pay | Admitting: Family Medicine

## 2012-04-18 VITALS — BP 154/85 | HR 107 | Temp 99.0°F | Ht 67.0 in | Wt 146.1 lb

## 2012-04-18 DIAGNOSIS — F19939 Other psychoactive substance use, unspecified with withdrawal, unspecified: Secondary | ICD-10-CM

## 2012-04-18 DIAGNOSIS — F13239 Sedative, hypnotic or anxiolytic dependence with withdrawal, unspecified: Secondary | ICD-10-CM

## 2012-04-18 DIAGNOSIS — F32A Depression, unspecified: Secondary | ICD-10-CM

## 2012-04-18 DIAGNOSIS — F3289 Other specified depressive episodes: Secondary | ICD-10-CM

## 2012-04-18 DIAGNOSIS — F13930 Sedative, hypnotic or anxiolytic use, unspecified with withdrawal, uncomplicated: Secondary | ICD-10-CM | POA: Insufficient documentation

## 2012-04-18 DIAGNOSIS — F1520 Other stimulant dependence, uncomplicated: Secondary | ICD-10-CM

## 2012-04-18 DIAGNOSIS — F13939 Sedative, hypnotic or anxiolytic use, unspecified with withdrawal, unspecified: Secondary | ICD-10-CM

## 2012-04-18 DIAGNOSIS — F329 Major depressive disorder, single episode, unspecified: Secondary | ICD-10-CM

## 2012-04-18 DIAGNOSIS — F13230 Sedative, hypnotic or anxiolytic dependence with withdrawal, uncomplicated: Secondary | ICD-10-CM | POA: Insufficient documentation

## 2012-04-18 MED ORDER — LORAZEPAM 2 MG PO TABS: 2.0000 mg | ORAL_TABLET | Freq: Four times a day (QID) | ORAL | Status: DC | PRN

## 2012-04-18 MED ORDER — VENLAFAXINE HCL ER 75 MG PO CP24: 75.0000 mg | ORAL_CAPSULE | Freq: Every day | ORAL | Status: DC

## 2012-04-18 MED ORDER — VENLAFAXINE HCL 75 MG PO TABS: 75.0000 mg | ORAL_TABLET | Freq: Two times a day (BID) | ORAL | Status: DC

## 2012-04-18 MED ORDER — IBUPROFEN 600 MG PO TABS: 600.0000 mg | ORAL_TABLET | Freq: Three times a day (TID) | ORAL | Status: DC | PRN

## 2012-04-18 NOTE — Progress Notes (Signed)
  Subjective:    Patient ID: Karen Casey, female    DOB: [birth year], 56 y.o.   MRN: [id number]  HPI  1.  FU for ED visit:  Patient presents today for FU with PCP after fall on Saturday, 3 days ago.  Patient had unwitnessed fall and was found by her sister.  Patient had gone outside after feeling "jittery" and wanted some fresh air.  Sister did not hear or see fall.  Found her lying on ground shaking with drooling.  Not responsive to verbal stimulation. Patient doesn't remember anything until she awoke in ambulance.    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  She stopped her Xanax about 3 days "cold [place]" prior to fall.  Has been on 6 daily Xanax for past 6-7 years.  Prescribed Ativan in ED TID dosing but states this doesn't "touch my nerves."  Has been having feelings of anxiety and racing thoughts that keep her up at night.  No suicidal ideation.  Depression persists.   Review of Systems See HPI above for review of systems.       Objective:   Physical Exam Gen:  Alert, cooperative patient who appears stated age in no acute distress.  Vital signs reviewed. HEENT:  [place]/AT, MMM.  Cataract noted Right eye.  Neck:  No Thyromegaly. Cardiac:  Regular rate and rhythm without murmur auscultated.  Good S1/S2. Pulm:  Clear to auscultation bilaterally with good air movement.  No wheezes or rales noted.   Psych:  Anxious appearing.  Some tremulousness.   Neuro:  No focal deficits, ambulating well.        Assessment & Plan:

## 2012-04-18 NOTE — Assessment & Plan Note (Signed)
Has been on Prozac for years and feels no difference when taking this. Plan to switch to Effexor today.   Trial of this for next several weeks. Also Ativan short term to help with anxiety.  Taper of this, see above.

## 2012-04-18 NOTE — Assessment & Plan Note (Signed)
Long discussion with patient about depression, anxiety, treatment, and benzo W/D.  (~25 minutes face time with patient and her sister, also present today) Plan is to do very slow benzo taper.   Increase to 2 mg TID ativan to prevent seizures and to help with anxiety, x 1 week.  May need to prolong until she begins to experience improvement with Effexor.  At that point, plan to taper to 1 mg TID and then further down from there as well.   FU with me next week.

## 2012-04-18 NOTE — Patient Instructions (Signed)
Take the Ativan 2 pills (2 mg) three times a day scheduled for the next week.    Take the Effexor tomorrow AM.  Take this twice a day. Stop the Prozac today.    If you're having headaches, you can take Tylenol for relief.  If that's not strong enough, try the prescription strength Ibuprofen.  If you're stomach starts acting up, you can go to 60 mg of Prilosec for the next week or so, then drop back to once a day.

## 2012-04-24 ENCOUNTER — Telehealth: Payer: Self-pay | Admitting: Family Medicine

## 2012-04-24 NOTE — Telephone Encounter (Signed)
Forwarded to pcp.Karen Casey  

## 2012-04-24 NOTE — Telephone Encounter (Signed)
Patient is calling for another refill on Lorazepam.  Her last refill was a week ago for 30 pills and she has already taken all of them.  She uses [facility].

## 2012-04-25 NOTE — Telephone Encounter (Signed)
LMOM advising pt of Rx faxed to pharmacy.

## 2012-04-25 NOTE — Telephone Encounter (Signed)
I faxed this in yesterday.  I assume the Pharmacy received it?  If not, I can resend it.

## 2012-04-26 ENCOUNTER — Ambulatory Visit: Payer: Self-pay | Admitting: Family Medicine

## 2012-04-28 ENCOUNTER — Telehealth: Payer: Self-pay | Admitting: Family Medicine

## 2012-04-28 NOTE — Telephone Encounter (Signed)
Pt is needing cataract surgery and Dr Nile Riggs will not accept the orange card - needs to know what to do - she needs the surgery

## 2012-05-01 ENCOUNTER — Other Ambulatory Visit: Payer: Self-pay | Admitting: Family Medicine

## 2012-05-01 ENCOUNTER — Ambulatory Visit: Payer: Self-pay | Admitting: Family Medicine

## 2012-05-01 MED ORDER — LORAZEPAM 2 MG PO TABS: 2.0000 mg | ORAL_TABLET | Freq: Three times a day (TID) | ORAL | Status: DC | PRN

## 2012-05-01 NOTE — Telephone Encounter (Signed)
Patient coming in Tuesdays 11/19 for office visit with Dr. Gwendolyn Grant. Will address then

## 2012-05-02 ENCOUNTER — Ambulatory Visit: Payer: Self-pay | Admitting: Family Medicine

## 2012-05-08 ENCOUNTER — Other Ambulatory Visit: Payer: Self-pay | Admitting: Family Medicine

## 2012-05-08 MED ORDER — LORAZEPAM 2 MG PO TABS: 2.0000 mg | ORAL_TABLET | Freq: Two times a day (BID) | ORAL | Status: DC | PRN

## 2012-05-08 NOTE — Telephone Encounter (Signed)
Pt is calling again - needs enough until Wednesday at her appt

## 2012-05-08 NOTE — Telephone Encounter (Signed)
Patient is calling asking if a refill for a months supply of Lorazepam can be sent to [facility].  She said she is trying to ween herself off of them.

## 2012-05-08 NOTE — Telephone Encounter (Signed)
Will prescribe enough to get her to her appointment this coming Wednesday.

## 2012-05-10 ENCOUNTER — Ambulatory Visit: Payer: Self-pay | Admitting: Family Medicine

## 2012-05-10 ENCOUNTER — Telehealth: Payer: Self-pay | Admitting: Family Medicine

## 2012-05-10 ENCOUNTER — Ambulatory Visit (INDEPENDENT_AMBULATORY_CARE_PROVIDER_SITE_OTHER): Payer: Self-pay | Admitting: Family Medicine

## 2012-05-10 ENCOUNTER — Telehealth: Payer: Self-pay | Admitting: Clinical

## 2012-05-10 VITALS — BP 148/82 | HR 115 | Temp 98.8°F | Ht 67.0 in | Wt 148.7 lb

## 2012-05-10 DIAGNOSIS — H269 Unspecified cataract: Secondary | ICD-10-CM

## 2012-05-10 DIAGNOSIS — F1520 Other stimulant dependence, uncomplicated: Secondary | ICD-10-CM

## 2012-05-10 DIAGNOSIS — F13239 Sedative, hypnotic or anxiolytic dependence with withdrawal, unspecified: Secondary | ICD-10-CM

## 2012-05-10 DIAGNOSIS — K589 Irritable bowel syndrome without diarrhea: Secondary | ICD-10-CM

## 2012-05-10 DIAGNOSIS — F13939 Sedative, hypnotic or anxiolytic use, unspecified with withdrawal, unspecified: Secondary | ICD-10-CM

## 2012-05-10 DIAGNOSIS — F3289 Other specified depressive episodes: Secondary | ICD-10-CM

## 2012-05-10 DIAGNOSIS — F329 Major depressive disorder, single episode, unspecified: Secondary | ICD-10-CM

## 2012-05-10 DIAGNOSIS — R030 Elevated blood-pressure reading, without diagnosis of hypertension: Secondary | ICD-10-CM | POA: Insufficient documentation

## 2012-05-10 DIAGNOSIS — F19939 Other psychoactive substance use, unspecified with withdrawal, unspecified: Secondary | ICD-10-CM

## 2012-05-10 DIAGNOSIS — IMO0001 Reserved for inherently not codable concepts without codable children: Secondary | ICD-10-CM

## 2012-05-10 DIAGNOSIS — F32A Depression, unspecified: Secondary | ICD-10-CM

## 2012-05-10 DIAGNOSIS — K219 Gastro-esophageal reflux disease without esophagitis: Secondary | ICD-10-CM

## 2012-05-10 MED ORDER — LORAZEPAM 2 MG PO TABS: 2.0000 mg | ORAL_TABLET | Freq: Two times a day (BID) | ORAL | Status: DC | PRN

## 2012-05-10 MED ORDER — OMEPRAZOLE 20 MG PO CPDR: 40.0000 mg | DELAYED_RELEASE_CAPSULE | Freq: Every day | ORAL | Status: DC

## 2012-05-10 MED ORDER — DICYCLOMINE HCL 10 MG PO CAPS: 10.0000 mg | ORAL_CAPSULE | Freq: Three times a day (TID) | ORAL | Status: DC

## 2012-05-10 NOTE — Telephone Encounter (Signed)
Spoke with patient and informed her that she would have to keep appointment today due to her canceling/no showing last 3 appointments. The last time she called we filled enough to get her through today and she was told she needed to keep appointment to day in order to get refill. She has agreed to keep appointment and I put her back on Dr. Tyson Alias schedule for 1:45 pm

## 2012-05-10 NOTE — Assessment & Plan Note (Signed)
Did not recheck blood pressure today. May need to be started on anti-hypertensives depending on prolonged elevation of blood pressure.

## 2012-05-10 NOTE — Telephone Encounter (Signed)
Patient is unable to keep her appt due to family in town and has rescheduled her appt for 12/2 but she needs enough Lorazapam to last through the weekend.  She needs this sent to [facility] pharmacy and she needs this to happen today.

## 2012-05-10 NOTE — Assessment & Plan Note (Addendum)
Continue Effexor currently.  Increased to 150 mg bid to help with anxiety control.   FU in 1 month.

## 2012-05-10 NOTE — Progress Notes (Signed)
Patient ID: Karen Casey, female   DOB: March 25, 1956, 56 y.o.   MRN: [id number]. Karen Casey is a 56 y.o. female who presents to [facility] today for follow-up of several issues:  1.  Anxiety and benzodiazapine use:  Trying to taper herself off of benzo's but this is very difficult for her.  Still taking Effexor.  Has panic attacks on almost daily basis.  Down to BID dosing of Ativan.  Unsure if she can cut it back from there.   Very tremulous most days.  Denies any depressive symptoms, no SI/HI.   2.  Abdominal cramping:  Has started to recur.  Only 1 episode of vomiting in past several weeks, however, which is definitely an improvement.  Still taking Prilosec.  Was prescribed Bentyl but never got this filled, has prescription with her for this but would rather I print one out for her as she is concerned [employer] fill it.  Still with diarrhea that relieves her abdominal pain on most days.  No bloody diarrhea.   3.  Cataract:  Seen by ophtho here in [place], recommended removal but she is unable to afford this currently.  Was recommended to social worker but unable to obtain any financial help.  She is now having difficulty seeing due to the cataract.     The following portions of the patient's history were reviewed and updated as appropriate: allergies, current medications, past medical history, family and social history, and problem list.  Patient is a nonsmoker.  Past Medical History  Diagnosis Date  . GERD (gastroesophageal reflux disease)   . Anxiety   . Depression     ROS as above otherwise neg. No Chest pain, palpitations, SOB, Fever, Chills, Abd pain, N/V/D.  Medications reviewed. Current Outpatient Prescriptions  Medication Sig Dispense Refill  . acetaminophen (TYLENOL) 325 MG tablet Take 650 mg by mouth every 6 (six) hours as needed. For pain            . ibuprofen (ADVIL,MOTRIN) 600 MG tablet Take 1 tablet (600 mg total) by mouth every 8 (eight) hours as  needed for pain.  30 tablet  0  . LORazepam (ATIVAN) 2 MG tablet Take 1 tablet (2 mg total) by mouth 2 (two) times daily as needed for anxiety.  60 tablet  1  . Multiple Vitamin (MULITIVITAMIN WITH MINERALS) TABS Take 1 tablet by mouth daily.      Marland Kitchen omeprazole (PRILOSEC) 20 MG capsule Take 2 capsules (40 mg total) by mouth daily.  60 capsule  1  . venlafaxine (EFFEXOR) 75 MG tablet Take 1 tablet (75 mg total) by mouth 2 (two) times daily.  60 tablet  1    Exam:  BP 148/82  Pulse 115  Temp 98.8 F (37.1 C) (Oral)  Ht 5\' 7"  (1.702 m)  Wt 148 lb 11.2 oz (67.45 kg)  BMI 23.29 kg/m2 Gen: Well NAD HEENT: EOMI,  MMM.  Cataract noted Right eye.   Lungs: CTABL Nl WOB Heart: RRR no MRG Abd: NABS, NT, ND Exts: Non edematous BL  LE, warm and well perfused.  Psych:  Somewhat anxious appearing, not depressed.    No results found for this or any previous visit (from the past 72 hour(s)).

## 2012-05-10 NOTE — Telephone Encounter (Signed)
Clinical Child psychotherapist (CSW) received a referral to assist pt with questions regarding paying for her surgery. CSW contacted pt and explored pt concerns/questions. Pt stated she is trying to have eye surgery however due to being uninsured she would have to come up with $3100 for cataract surgery. CSW explored whether she has expressed her concern/need to the eye center and whether they have any assistance. Pt stated she could go on a payment plan however at the current moment neither she or her husband are able to work and or have any income. CSW confirmed that she and spouse have both applied for disability and Medicaid and are currently awaiting a decision. CSW explained that unfortunately there are no current agencies that provide assistance for copays to have surgery. CSW empathesized with pt and apologized for the lack of resources CSW could provide to pt. CSW did however encourage pt to contact [employer] to assist with her electricity bill once pt expressed challenge to pay her utility bill. CSW encouraged pt to contact CSW back if there was anything that CSW could assist with.  Theresia Bough, MSW, Theresia Majors [phone]

## 2012-05-10 NOTE — Assessment & Plan Note (Signed)
Continue very slow taper over weeks.  Will move from 2 mg BID to 1 mg TID next visit.

## 2012-05-10 NOTE — Assessment & Plan Note (Signed)
I wrote for her bentyl today.   Low dose, may need up-titration next visit.

## 2012-05-10 NOTE — Patient Instructions (Addendum)
Take the Bentyl three times a day and before bed.  Cut back to twice a day with the Ativan.    Take 2 of the Effexor in the AM and 2 in the PM until you pick up the new prescription.    It was good to see you again.  Have a good Thanksgiving and happy birthday!

## 2012-05-10 NOTE — Assessment & Plan Note (Signed)
Continue PPI ?

## 2012-05-10 NOTE — Assessment & Plan Note (Signed)
Will have Karen Casey CSW give her a call to see if we can find any other resources for her.

## 2012-05-15 ENCOUNTER — Ambulatory Visit: Payer: Self-pay | Admitting: Family Medicine

## 2012-05-17 ENCOUNTER — Telehealth: Payer: Self-pay | Admitting: Family Medicine

## 2012-05-17 NOTE — Telephone Encounter (Signed)
Pt thinks that she needs more than 2 lorazepam per day - doesn't seem to be enough  [facility]

## 2012-05-18 NOTE — Telephone Encounter (Signed)
Pt called again today to see if Dr Gwendolyn Grant can increase her lorazepam

## 2012-05-18 NOTE — Telephone Encounter (Signed)
Not comfortable increasing Ativan in patient who has struggled with abuse previously.  Continue SNRI and will encourage for psych referral.

## 2012-05-22 ENCOUNTER — Ambulatory Visit: Payer: Self-pay | Admitting: Family Medicine

## 2012-05-25 ENCOUNTER — Emergency Department (HOSPITAL_COMMUNITY)
Admission: EM | Admit: 2012-05-25 | Discharge: 2012-05-26 | Disposition: A | Discharge status: Home / Self Care | Payer: Self-pay | Attending: Emergency Medicine | Admitting: Emergency Medicine

## 2012-05-25 ENCOUNTER — Encounter (HOSPITAL_COMMUNITY): Payer: Self-pay | Admitting: *Deleted

## 2012-05-25 ENCOUNTER — Ambulatory Visit: Payer: Self-pay | Admitting: Family Medicine

## 2012-05-25 DIAGNOSIS — F1020 Alcohol dependence, uncomplicated: Secondary | ICD-10-CM | POA: Insufficient documentation

## 2012-05-25 DIAGNOSIS — F3289 Other specified depressive episodes: Secondary | ICD-10-CM | POA: Insufficient documentation

## 2012-05-25 DIAGNOSIS — Z79899 Other long term (current) drug therapy: Secondary | ICD-10-CM | POA: Insufficient documentation

## 2012-05-25 DIAGNOSIS — Z8719 Personal history of other diseases of the digestive system: Secondary | ICD-10-CM | POA: Insufficient documentation

## 2012-05-25 DIAGNOSIS — F17200 Nicotine dependence, unspecified, uncomplicated: Secondary | ICD-10-CM | POA: Insufficient documentation

## 2012-05-25 DIAGNOSIS — F411 Generalized anxiety disorder: Secondary | ICD-10-CM | POA: Insufficient documentation

## 2012-05-25 DIAGNOSIS — F32A Depression, unspecified: Secondary | ICD-10-CM

## 2012-05-25 DIAGNOSIS — F329 Major depressive disorder, single episode, unspecified: Secondary | ICD-10-CM | POA: Insufficient documentation

## 2012-05-25 LAB — RAPID URINE DRUG SCREEN, HOSP PERFORMED
Barbiturates: NOT DETECTED
Opiates: NOT DETECTED
Tetrahydrocannabinol: POSITIVE — AB

## 2012-05-25 LAB — COMPREHENSIVE METABOLIC PANEL
AST: 36 U/L (ref 0–37)
Alkaline Phosphatase: 130 U/L — ABNORMAL HIGH (ref 39–117)
CO2: 22 mEq/L (ref 19–32)
Chloride: 98 mEq/L (ref 96–112)
Creatinine, Ser: 0.59 mg/dL (ref 0.50–1.10)
GFR calc non Af Amer: >90 mL/min (ref >90)
Total Bilirubin: 0.2 mg/dL — ABNORMAL LOW (ref 0.3–1.2)

## 2012-05-25 LAB — CBC
HCT: 44 % (ref 36.0–46.0)
MCV: 92.2 fL (ref 78.0–100.0)
Platelets: 333 10*3/uL (ref 150–400)
RBC: 4.77 MIL/uL (ref 3.87–5.11)
WBC: 5.3 10*3/uL (ref 4.0–10.5)

## 2012-05-25 MED ORDER — ALUM & MAG HYDROXIDE-SIMETH 200-200-20 MG/5ML PO SUSP: 30.0000 mL | ORAL | Status: DC | PRN

## 2012-05-25 MED ORDER — IBUPROFEN 600 MG PO TABS: 600.0000 mg | ORAL_TABLET | Freq: Three times a day (TID) | ORAL | Status: DC | PRN

## 2012-05-25 MED ORDER — VITAMIN B-1 100 MG PO TABS
100.0000 mg | ORAL_TABLET | Freq: Every day | ORAL | Status: DC
  Administered 2012-05-25: 100 mg via ORAL
  Filled 2012-05-25: qty 1

## 2012-05-25 MED ORDER — THIAMINE HCL 100 MG/ML IJ SOLN: 100.0000 mg | Freq: Every day | INTRAMUSCULAR | Status: DC

## 2012-05-25 MED ORDER — LORAZEPAM 1 MG PO TABS: 0.0000 mg | ORAL_TABLET | Freq: Two times a day (BID) | ORAL | Status: DC

## 2012-05-25 MED ORDER — FOLIC ACID 1 MG PO TABS
1.0000 mg | ORAL_TABLET | Freq: Every day | ORAL | Status: DC
  Administered 2012-05-25: 1 mg via ORAL
  Filled 2012-05-25: qty 1

## 2012-05-25 MED ORDER — ADULT MULTIVITAMIN W/MINERALS CH
1.0000 | ORAL_TABLET | Freq: Every day | ORAL | Status: DC
  Administered 2012-05-25: 1 via ORAL
  Filled 2012-05-25: qty 1

## 2012-05-25 MED ORDER — ZOLPIDEM TARTRATE 5 MG PO TABS
5.0000 mg | ORAL_TABLET | Freq: Every evening | ORAL | Status: DC | PRN
  Administered 2012-05-26: 5 mg via ORAL
  Filled 2012-05-25: qty 1

## 2012-05-25 MED ORDER — LORAZEPAM 2 MG/ML IJ SOLN: 1.0000 mg | Freq: Four times a day (QID) | INTRAMUSCULAR | Status: DC | PRN

## 2012-05-25 MED ORDER — LORAZEPAM 1 MG PO TABS
0.0000 mg | ORAL_TABLET | Freq: Four times a day (QID) | ORAL | Status: DC
  Administered 2012-05-25: 2 mg via ORAL
  Administered 2012-05-26: 1 mg via ORAL
  Filled 2012-05-25: qty 1
  Filled 2012-05-25: qty 2

## 2012-05-25 MED ORDER — NICOTINE 21 MG/24HR TD PT24
21.0000 mg | MEDICATED_PATCH | Freq: Every day | TRANSDERMAL | Status: DC
  Administered 2012-05-25: 21 mg via TRANSDERMAL
  Filled 2012-05-25: qty 1

## 2012-05-25 MED ORDER — LORAZEPAM 1 MG PO TABS: 1.0000 mg | ORAL_TABLET | Freq: Four times a day (QID) | ORAL | Status: DC | PRN

## 2012-05-25 MED ORDER — ONDANSETRON HCL 4 MG PO TABS: 4.0000 mg | ORAL_TABLET | Freq: Three times a day (TID) | ORAL | Status: DC | PRN

## 2012-05-25 MED ORDER — ACETAMINOPHEN 325 MG PO TABS: 650.0000 mg | ORAL_TABLET | ORAL | Status: DC | PRN

## 2012-05-25 NOTE — ED Notes (Signed)
Pt c/o out of meds x 3 days for depression and anxiety; crying on admission; denies si

## 2012-05-25 NOTE — ED Provider Notes (Signed)
History     CSN: [id number]  Arrival date & time 05/25/12  1909   First MD Initiated Contact with Patient 05/25/12 1950      Chief Complaint  Patient presents with  . Medical Clearance    (Consider location/radiation/quality/duration/timing/severity/associated sxs/prior treatment) HPI 56 year old female presents to emergency department with chief complaint of depression and anxiety.  Patient is followed by Dr. Gwendolyn Grant at [facility] family practice.  Patient has a past medical history of anxiety and depression.  She has been on Xanax for a long time was recently switch to lorazepam is being weaned down off of her high doses.  She also admits to a problem with alcohol abuse and states that her use varies between 1-2 beers 2 up to 12 beers in one day.  The patient denies any suicidal ideation homicidal ideation or audiovisual hallucinations.  She does have a past medical history of alcohol withdrawal seizure this past November.  Patient also has chronic abdominal pain and GERD along with a 70 pound weight loss this year and chronic diarrhea. She states that she has gained about 20 of those pounds back.  Past Medical History  Diagnosis Date  . GERD (gastroesophageal reflux disease)   . Anxiety   . Depression     Past Surgical History  Procedure Date  . Appendectomy   . Left shoulder dislocation Sept 2011    Family History  Problem Relation Age of Onset  . Hypertension Mother   . Hyperlipidemia Mother   . Heart disease Father   . Depression Father   . Parkinsonism Father     History  Substance Use Topics  . Smoking status: Current Every Day Smoker -- 0.5 packs/day    Types: Cigarettes  . Smokeless tobacco: Not on file  . Alcohol Use: 0.6 oz/week    1 Cans of beer per week    OB History    Grav Para Term Preterm Abortions TAB SAB Ect Mult Living                  Review of Systems Ten systems are reviewed and are negative for acute change except as noted in the  HPI  Allergies  Review of patient's allergies indicates no known allergies.  Home Medications   Current Outpatient Rx  Name  Route  Sig  Dispense  Refill  . IBUPROFEN 600 MG PO TABS   Oral   Take 1 tablet (600 mg total) by mouth every 8 (eight) hours as needed for pain.   30 tablet   0   . LORAZEPAM 2 MG PO TABS   Oral   Take 2 mg by mouth 2 (two) times daily as needed. Anxiety         . OMEPRAZOLE 20 MG PO CPDR   Oral   Take 2 capsules (40 mg total) by mouth daily.   60 capsule   1   . VENLAFAXINE HCL 75 MG PO TABS   Oral   Take 1 tablet (75 mg total) by mouth 2 (two) times daily.   60 tablet   1     Please ignore the Effexor ER - it should be short- ...     BP 145/89  Pulse 104  Temp 98.9 F (37.2 C)  Resp 20  SpO2 97%  Physical Exam Physical Exam  Nursing note and vitals reviewed. Constitutional: She is oriented to person, place, and time. She appears well-developed and well-nourished.  Tearful and crying.  She appears  anxious. HENT:  Head: Normocephalic and atraumatic.  Eyes: Conjunctivae normal and EOM are normal. Pupils are equal, round, and reactive to light. No scleral icterus.  Cataract of the right eye Neck: Normal range of motion.  Cardiovascular: Normal rate, regular rhythm and normal heart sounds.  Exam reveals no gallop and no friction rub.   No murmur heard. Pulmonary/Chest: Effort normal and breath sounds normal. No respiratory distress.  Abdominal: Soft. Bowel sounds are normal. She exhibits no distension and no mass. There is no tenderness. There is no guarding.  Neurological: She is alert and oriented to person, place, and time.  Skin: Skin is warm and dry. She is not diaphoretic.      ED Course  Procedures (including critical care time)  Labs Reviewed  CBC - Abnormal; Notable for the following:    Hemoglobin 16.3 (*)     MCH 34.2 (*)     MCHC 37.0 (*)     All other components within normal limits  COMPREHENSIVE METABOLIC PANEL  - Abnormal; Notable for the following:    BUN 5 (*)     ALT 36 (*)     Alkaline Phosphatase 130 (*)     Total Bilirubin 0.2 (*)     All other components within normal limits  ETHANOL - Abnormal; Notable for the following:    Alcohol, Ethyl (B) 113 (*)     All other components within normal limits  URINE RAPID DRUG SCREEN (HOSP PERFORMED)   No results found.   No diagnosis found.    MDM  8:35 PM BP 145/89  Pulse 104  Temp 98.9 F (37.2 C)  Resp 20  SpO2 97% Patient with elevated pulse and slightly elevated blood pressure.  I'm placing the patient on CIWA protocol.  Patient here for depression and anxiety chronic alcohol abuse. Labs Within normal limits. Safe for [facility] assessment.        Arthor Captain, PA-C 05/26/12 1132

## 2012-05-25 NOTE — ED Notes (Signed)
One bag and a coat in locker 35

## 2012-05-26 ENCOUNTER — Ambulatory Visit (INDEPENDENT_AMBULATORY_CARE_PROVIDER_SITE_OTHER): Payer: No Typology Code available for payment source | Admitting: Family Medicine

## 2012-05-26 VITALS — BP 177/120 | HR 90 | Ht 67.0 in | Wt 145.2 lb

## 2012-05-26 DIAGNOSIS — F19939 Other psychoactive substance use, unspecified with withdrawal, unspecified: Secondary | ICD-10-CM

## 2012-05-26 DIAGNOSIS — F1520 Other stimulant dependence, uncomplicated: Secondary | ICD-10-CM

## 2012-05-26 DIAGNOSIS — F13239 Sedative, hypnotic or anxiolytic dependence with withdrawal, unspecified: Secondary | ICD-10-CM

## 2012-05-26 DIAGNOSIS — F1020 Alcohol dependence, uncomplicated: Secondary | ICD-10-CM

## 2012-05-26 DIAGNOSIS — F13939 Sedative, hypnotic or anxiolytic use, unspecified with withdrawal, unspecified: Secondary | ICD-10-CM

## 2012-05-26 MED ORDER — ESCITALOPRAM OXALATE 10 MG PO TABS: 10.0000 mg | ORAL_TABLET | Freq: Every day | ORAL | Status: DC

## 2012-05-26 MED ORDER — LORAZEPAM 2 MG PO TABS: 2.0000 mg | ORAL_TABLET | Freq: Three times a day (TID) | ORAL | Status: DC | PRN

## 2012-05-26 MED ORDER — OMEPRAZOLE 20 MG PO CPDR: 40.0000 mg | DELAYED_RELEASE_CAPSULE | Freq: Every day | ORAL | Status: DC

## 2012-05-26 MED ORDER — ALPRAZOLAM 0.5 MG PO TABS: 0.5000 mg | ORAL_TABLET | Freq: Three times a day (TID) | ORAL | Status: DC

## 2012-05-26 NOTE — Patient Instructions (Addendum)
You have made the best first step, which is admitting that you need help.  Call the various resources, starting with [facility].   Take the Lexapro 1 pill a day.  Take the Lorazepam three times a day if needed.  We will do a week at a time with this.    Come back and check in with one of [place] next week.    I want to see you the week of Dec 30.    Call if you have ANY questions or concerns.  Normal Andrey Campanile will also be giving you a call soon.    If you would like help finding insurance under the Affordable Care Act, please call [phone].    A person will help walk you through the process.

## 2012-05-26 NOTE — ED Provider Notes (Signed)
  Physical Exam  BP 146/89  Pulse 97  Temp 99 F (37.2 C) (Oral)  Resp 20  SpO2 95%  Physical Exam  ED Course  Procedures  MDM The patient has been cleared for discharge by psychiatry Dr. Jacky Kindle, no SI / HI and no severe depression - recommends Lexapro daily and Xanax 3 times a day.      Vida Roller, MD 05/26/12 [phone]

## 2012-05-26 NOTE — BH Assessment (Signed)
Assessment Note   Karen Casey is a 56 y.o. female PRESENTING VOLUNTARILY TO ED AT THE REQUEST OF HER FAMILY MEMBERS.  PT REPORTS THE FOLLOWING: PT HAS INCREASED DEPRESSED/ANXIETY X1YR DUE TO STRESSORS (1) LOSING JOB, (2) LOSING HOME, (3) FINANCIAL PROBLEMS, (4) ADDICTION TO XANAX, (5) INCREASED DRINKING UP TO 12 BEERS DAILY, LAST INTAKE EAS 05/25/12.  PT DENIES SI, BUT STATES SHE IS AFRAID TO GO TO SLEEP BECAUSE SHE MIGHT NOT WAKE UP.  PT TELLS THIS WRITER THAT SHE WOULD NEVER HARM SELF-"I HAVE TOO MUCH FAITH IN GOD AND I LOVE MY FAMILY".  PT HAS BEEN OFF X2 DAYS BECAUSE SHE CAN'T AFFORD THEM, SHE HAS BEEN OUT OF WORK SINCE 2012.  PT SAYS SHE'S LOST 70PDS.  PT SAYS SHE FEELS SAFE RETURNING HOME.  PT SAYS SHE HAS LOST 70 PDS. PT HAS BEEN OFF MEDS 2 DAYS DUE TO FINANCIAL REASONS. PT HAS BEEN OUT OF WORK SINCE 02/2011.    Axis I: Alcohol Abuse, Anxiety Disorder NOS and Depressive Disorder NOS Axis II: Deferred Axis III:  Past Medical History  Diagnosis Date  . GERD (gastroesophageal reflux disease)   . Anxiety   . Depression    Axis IV: other psychosocial or environmental problems, problems related to social environment and problems with primary support group Axis V: 51-60 moderate symptoms  Past Medical History:  Past Medical History  Diagnosis Date  . GERD (gastroesophageal reflux disease)   . Anxiety   . Depression     Past Surgical History  Procedure Date  . Appendectomy   . Left shoulder dislocation Sept 2011    Family History:  Family History  Problem Relation Age of Onset  . Hypertension Mother   . Hyperlipidemia Mother   . Heart disease Father   . Depression Father   . Parkinsonism Father     Social History:  reports that she has been smoking Cigarettes.  She has been smoking about .5 packs per day. She does not have any smokeless tobacco history on file. She reports that she drinks about .6 ounces of alcohol per week. She reports that she does not use illicit  drugs.  Additional Social History:  Alcohol / Drug Use Pain Medications: See MAR  Prescriptions: See MAR  Over the Counter: See MAR  History of alcohol / drug use?: Yes Substance #1 Name of Substance 1: Alcohol--Beer  1 - Age of First Use: 50's  1 - Amount (size/oz): 6-12 Beers  1 - Frequency: Daily  1 - Duration: Since 11/2011 1 - Last Use / Amount: 05/25/12  CIWA: CIWA-Ar BP: 146/89 mmHg Pulse Rate: 97  Nausea and Vomiting: no nausea and no vomiting Tactile Disturbances: none Tremor: no tremor Auditory Disturbances: not present Paroxysmal Sweats: no sweat visible Visual Disturbances: not present Anxiety: no anxiety, at ease Headache, Fullness in Head: none present Agitation: normal activity Orientation and Clouding of Sensorium: oriented and can do serial additions CIWA-Ar Total: 0  COWS:    Allergies: No Known Allergies  Home Medications:  (Not in a hospital admission)  OB/GYN Status:  No LMP recorded. Patient is postmenopausal.  General Assessment Data Location of Assessment: [facility] Living Arrangements: Spouse/significant other;Children Can pt return to current living arrangement?: No Admission Status: Voluntary Is patient capable of signing voluntary admission?: Yes Transfer from: Acute Hospital Referral Source: MD  Education Status Is patient currently in school?: No Current Grade: None  Highest grade of school patient has completed: None  Name of school: None  Contact person: None   Risk to self Suicidal Ideation: No Suicidal Intent: No Is patient at risk for suicide?: No Suicidal Plan?: No Access to Means: No What has been your use of drugs/alcohol within the last 12 months?: Abusing: Alcohol; Past abuse of Xanax  Previous Attempts/Gestures: No How many times?: 0  Other Self Harm Risks: None  Triggers for Past Attempts: None known Intentional Self Injurious Behavior: None Family Suicide History: No Recent stressful life event(s): Job  Loss;Financial Problems (SA Issues ) Persecutory voices/beliefs?: No Depression: Yes Depression Symptoms: Tearfulness;Despondent;Fatigue;Loss of interest in usual pleasures;Feeling worthless/self pity Substance abuse history and/or treatment for substance abuse?: No Suicide prevention information given to non-admitted patients: Not applicable  Risk to Others Homicidal Ideation: No-Not Currently/Within Last 6 Months Thoughts of Harm to Others: No-Not Currently Present/Within Last 6 Months Current Homicidal Intent: No Current Homicidal Plan: No Access to Homicidal Means: No Identified Victim: None  History of harm to others?: No Assessment of Violence: None Noted Violent Behavior Description: None  Does patient have access to weapons?: No Criminal Charges Pending?: No Does patient have a court date: No  Psychosis Hallucinations: None noted Delusions: None noted  Mental Status Report Appear/Hygiene: Disheveled Eye Contact: Good Motor Activity: Unremarkable Speech: Pressured;Logical/coherent Level of Consciousness: Alert;Restless Mood: Anxious;Depressed;Anhedonia;Sad Affect: Anxious;Depressed;Fearful;Sad Anxiety Level: Moderate Thought Processes: Coherent;Relevant Judgement: Unimpaired Orientation: Person;Place;Time;Situation Obsessive Compulsive Thoughts/Behaviors: None  Cognitive Functioning Concentration: Decreased Memory: Recent Intact;Remote Intact IQ: Average Insight: Fair Impulse Control: Fair Appetite: Poor Weight Loss: 70  Weight Gain: 0  Sleep: Decreased Total Hours of Sleep: 4  Vegetative Symptoms: None  ADLScreening [facility] Assessment Services) Patient's cognitive ability adequate to safely complete daily activities?: Yes Patient able to express need for assistance with ADLs?: Yes Independently performs ADLs?: Yes (appropriate for developmental age)  Abuse/Neglect [facility]) Physical Abuse: Denies Verbal Abuse: Denies Sexual Abuse: Denies  Prior Inpatient  Therapy Prior Inpatient Therapy: No Prior Therapy Dates: None  Prior Therapy Facilty/Provider(s): None  Reason for Treatment: None   Prior Outpatient Therapy Prior Outpatient Therapy: Yes Prior Therapy Dates: Past Dates Unk  Prior Therapy Facilty/Provider(s): [facility]  Reason for Treatment: Med Mgt   ADL Screening (condition at time of admission) Patient's cognitive ability adequate to safely complete daily activities?: Yes Patient able to express need for assistance with ADLs?: Yes Independently performs ADLs?: Yes (appropriate for developmental age) Weakness of Legs: None Weakness of Arms/Hands: None  Home Assistive Devices/Equipment Home Assistive Devices/Equipment: None  Therapy Consults (therapy consults require a physician order) PT Evaluation Needed: No OT Evalulation Needed: No SLP Evaluation Needed: No Abuse/Neglect Assessment (Assessment to be complete while patient is alone) Physical Abuse: Denies Verbal Abuse: Denies Sexual Abuse: Denies Exploitation of patient/patient's resources: Denies Self-Neglect: Denies Values / Beliefs Cultural Requests During Hospitalization: None Spiritual Requests During Hospitalization: None Consults Spiritual Care Consult Needed: No Social Work Consult Needed: No Merchant navy officer (For Healthcare) Advance Directive: Patient does not have advance directive;Patient would not like information Pre-existing out of facility DNR order (yellow form or pink MOST form): No Nutrition Screen- [facility] Adult/[facility] Patient's home diet: Regular Have you recently lost weight without trying?: No Have you been eating poorly because of a decreased appetite?: No Malnutrition Screening Tool Score: 0   Additional Information 1:1 In Past 12 Months?: No CIRT Risk: No Elopement Risk: No Does patient have medical clearance?: Yes     Disposition:  Disposition Disposition of Patient: Outpatient treatment Type of outpatient treatment: Adult  On  Site Evaluation by:  Reviewed with Physician:     Murrell Redden 05/26/2012 5:32 AM

## 2012-05-27 NOTE — ED Provider Notes (Signed)
Medical screening examination/treatment/procedure(s) were performed by non-physician practitioner and as supervising physician I was immediately available for consultation/collaboration.   Fredonia Casalino R Myking Sar, MD 05/27/12 0113 

## 2012-05-28 DIAGNOSIS — F1020 Alcohol dependence, uncomplicated: Secondary | ICD-10-CM | POA: Insufficient documentation

## 2012-05-28 DIAGNOSIS — Z7289 Other problems related to lifestyle: Secondary | ICD-10-CM | POA: Insufficient documentation

## 2012-05-28 DIAGNOSIS — Z789 Other specified health status: Secondary | ICD-10-CM | POA: Insufficient documentation

## 2012-05-28 NOTE — Assessment & Plan Note (Signed)
Spent over 45 minutes of face-to-face time with the patient and her brother-in-law. Finally decided that patient will try outpatient rehabilitation and that she'll move in with her brother-in-law for the next couple of weeks. She feels that her husband often contributes to her drinking. She also has history of making poor decisions when at home. They'll call around an attempt to find some outpatient rehabilitation programs which they can afford. I provided him a handout of places to call today. Decision was made to try outpatient rehabilitation for to 4 weeks. If she has not having any success with this we will consider attempting inpatient therapy at that time. She is fully capable of making her decisions and does have capacity. Therefore if she desires not to do inpatient rehabilitation we cannot force her.

## 2012-05-28 NOTE — Assessment & Plan Note (Signed)
This is complicated by the fact she is undergoing alcohol cessation. Her last drink was yesterday afternoon. I do not want her to go through withdrawal seizures. Provide her with 3 times a day dosing of lorazepam. I provided her several prescriptions which will oppose each to refill each week. This way she cannot take all her medications at once.

## 2012-05-28 NOTE — Progress Notes (Signed)
  Subjective:    Patient ID: Karen Casey, female    DOB: 1956-04-20, 56 y.o.   MRN: [id number]  HPI  #1. Followup for emergency department visit: Patient seen in the emergency room last night for depression and anxiety. She was trying to quit cold [place] cessation of alcohol. However she was unable to complete this. She is out of her benzodiazepines and therefore presented to the emergency department. She was cleared by psychiatry as she was deemed no risk of harm to herself or others.  Patient states that her depression and anxiety have continually worsened. She is taking her venlafaxine as prescribed. She is not feel this is helped much. She is currently continuing to take her lorazepam but states that these do not last long enough that she is taking them more than twice a day. 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  Her brother-in-law is present today. He is very concerned about her. He has a prescriptions for both Xanax and Lexapro which the psychiatrist provided her-these were not filled. Again patient relates she is no intention to harm herself or others. She is very nervous about trying inpatient therapy. She does not feeling her family during the holidays. She will like to try pursuing outpatient therapy for alcohol rehabilitation.  No tremors. No fevers or chills. No palpitations.  Review of Systems See HPI above for review of systems.       Objective:   Physical Exam Gen:  Alert, cooperative patient who appears stated age in no acute distress.  Vital signs reviewed. HEENT:  [place]/AT, EOMI, MMM Neck:  NO thyromegaly Cardiac:  Regular rate and rhythm without murmur auscultated.  Good S1/S2. Pulm:  Clear to auscultation bilaterally with good air movement.  No wheezes or rales noted.   Ext:  No pretibial edema Neuro:  Alert and oriented x 3 Psych: Patient very anxious.  She is also very tearful and has crying episodes multiple times throughout our interaction. She has a linear and coherent thought process. She is depressed appearing       Assessment & Plan:

## 2012-05-30 ENCOUNTER — Telehealth: Payer: Self-pay | Admitting: Family Medicine

## 2012-05-30 DIAGNOSIS — F1010 Alcohol abuse, uncomplicated: Secondary | ICD-10-CM

## 2012-05-30 NOTE — Telephone Encounter (Signed)
Was waiting for a call from SW - she called to give different number

## 2012-05-31 NOTE — Addendum Note (Signed)
Addended byGwendolyn Grant, Newt Lukes on: 05/31/2012 03:52 PM   Modules accepted: Orders

## 2012-05-31 NOTE — Telephone Encounter (Signed)
Sounds great I will follow-up!

## 2012-05-31 NOTE — Telephone Encounter (Signed)
I don't have a referral for her. Do you know why she wants to speak to me?

## 2012-05-31 NOTE — Telephone Encounter (Signed)
That was my mistake.  Our visit lasted an hour last time she was here, I think I mentioned your name to help her obtain outpatient alcohol rehab services.  I know we've referred her to you recently in the past for her eye, but this would be a different referral.  She is attempting to find help on outpatient basis rather than inpatient.  Thanks!

## 2012-06-01 ENCOUNTER — Encounter: Payer: Self-pay | Admitting: Family Medicine

## 2012-06-01 ENCOUNTER — Ambulatory Visit (INDEPENDENT_AMBULATORY_CARE_PROVIDER_SITE_OTHER): Payer: No Typology Code available for payment source | Admitting: Family Medicine

## 2012-06-01 VITALS — BP 132/98 | HR 102 | Temp 98.1°F | Ht 67.0 in | Wt 147.0 lb

## 2012-06-01 DIAGNOSIS — F329 Major depressive disorder, single episode, unspecified: Secondary | ICD-10-CM

## 2012-06-01 DIAGNOSIS — F13239 Sedative, hypnotic or anxiolytic dependence with withdrawal, unspecified: Secondary | ICD-10-CM

## 2012-06-01 DIAGNOSIS — F1520 Other stimulant dependence, uncomplicated: Secondary | ICD-10-CM

## 2012-06-01 DIAGNOSIS — F3289 Other specified depressive episodes: Secondary | ICD-10-CM

## 2012-06-01 DIAGNOSIS — F32A Depression, unspecified: Secondary | ICD-10-CM

## 2012-06-01 DIAGNOSIS — J40 Bronchitis, not specified as acute or chronic: Secondary | ICD-10-CM

## 2012-06-01 DIAGNOSIS — F19939 Other psychoactive substance use, unspecified with withdrawal, unspecified: Secondary | ICD-10-CM

## 2012-06-01 DIAGNOSIS — F13939 Sedative, hypnotic or anxiolytic use, unspecified with withdrawal, unspecified: Secondary | ICD-10-CM

## 2012-06-01 MED ORDER — AZITHROMYCIN 250 MG PO TABS: ORAL_TABLET | ORAL | Status: DC

## 2012-06-01 NOTE — Patient Instructions (Addendum)
Have a great holidays!  Acute Bronchitis Bronchitis is when the organs and tissues involved in breathing get puffy (swollen) and can leak fluid. This makes it harder for air to get in and out of the lungs. You may cough a lot and produce thick spit (mucus). Acute means the illness started suddenly. HOME CARE  Rest.   Drink enough fluids to keep the pee (urine) clear or pale yellow.   Medicines may be given that will open up your airways to help you breathe better. Only take medicine as told by your doctor.   Use a cool mist vaporizer. This will help to thin any thick spit.   Do not smoke. Avoid secondhand smoke.  GET HELP RIGHT AWAY IF:    You have a temperature by mouth above 102 F (38.9 C), not controlled by medicine.   You have chills.   You develop severe shortness of breath or chest pain.   You have bloody spit mixed with mucus (sputum).   You throw up (vomit) often.   You lose too much body fluid (dehydrated).   You have a severe headache.   You feel faint.   You do not improve after 1 week of treatment.  MAKE SURE YOU:    Understand these instructions.   Will watch your condition.   Will get help right away if you are not doing well or get worse.  Document Released: 11/17/2007 Document Revised: 08/23/2011 Document Reviewed: 06/18/2009 [facility] Patient Information 2013 [place], [place].

## 2012-06-01 NOTE — Progress Notes (Signed)
  Subjective:    Patient ID: Karen Casey, female    DOB: Aug 08, 1955, 56 y.o.   MRN: [id number]  HPI 56 yo here for 1 week follow-up  PCP Dr. Gwendolyn Grant has been working with her on alcohol and benzo abuse.  Lexapro started at last visit Patient reports no change in mood or side effects since last seen.  She reports being hopeful as she met with Dr. Logan Bores at [facility] who helped her understand more about addiction and they are in the works to admit her for inpatient detox after christmas.  Cough:  Cough x 2 weeks.  Some mucous.  No dyspnea, no fever.  Some right sided rib pain.  Review of Systemssee HPI     Objective:   Physical Exam GEN: Alert & Oriented, No acute distress CV:  Regular Rate & Rhythm, no murmur Respiratory:  Normal work of breathing, rhonchorus Some mild ttp over right lower rib. Abd:  + BS, soft, no tenderness to palpation. Liver nontender         Assessment & Plan:

## 2012-06-01 NOTE — Assessment & Plan Note (Signed)
Has enough prescriptions to last her until she is admitted to inpatient behavioral health.

## 2012-06-01 NOTE — Assessment & Plan Note (Signed)
1-2 weeks with rhonchorus exam.  Azithro x 5 days.  Advised rib pain likely due to coughing, small chance of fracture but given mild and no significant risk factors, we decided to defer xray.

## 2012-06-01 NOTE — Assessment & Plan Note (Signed)
Tolerating lexapro well.  Will continue this med.  Patient has enough ativan to last until inpatient admission where she will be tapered off and care will be assumed by psychiatry.  Encouraged patient on her positive steps fwd, will let PCP know of her progress.

## 2012-06-09 ENCOUNTER — Inpatient Hospital Stay (HOSPITAL_COMMUNITY)
Admission: AD | Admit: 2012-06-09 | Discharge: 2012-06-14 | DRG: 897 | Disposition: A | Discharge status: Home / Self Care | Payer: Federal, State, Local not specified - Other | Attending: Psychiatry | Admitting: Psychiatry

## 2012-06-09 ENCOUNTER — Encounter (HOSPITAL_COMMUNITY): Payer: Self-pay | Admitting: *Deleted

## 2012-06-09 ENCOUNTER — Emergency Department (HOSPITAL_COMMUNITY)
Admission: EM | Admit: 2012-06-09 | Discharge: 2012-06-09 | Disposition: A | Discharge status: Other Acute Inpatient Hospital | Payer: Self-pay | Attending: Emergency Medicine | Admitting: Emergency Medicine

## 2012-06-09 ENCOUNTER — Encounter (HOSPITAL_COMMUNITY): Payer: Self-pay | Admitting: Emergency Medicine

## 2012-06-09 ENCOUNTER — Telehealth: Payer: Self-pay | Admitting: Clinical

## 2012-06-09 DIAGNOSIS — F1910 Other psychoactive substance abuse, uncomplicated: Secondary | ICD-10-CM

## 2012-06-09 DIAGNOSIS — F419 Anxiety disorder, unspecified: Secondary | ICD-10-CM | POA: Diagnosis present

## 2012-06-09 DIAGNOSIS — F329 Major depressive disorder, single episode, unspecified: Secondary | ICD-10-CM | POA: Insufficient documentation

## 2012-06-09 DIAGNOSIS — R197 Diarrhea, unspecified: Secondary | ICD-10-CM | POA: Insufficient documentation

## 2012-06-09 DIAGNOSIS — F1320 Sedative, hypnotic or anxiolytic dependence, uncomplicated: Principal | ICD-10-CM | POA: Diagnosis present

## 2012-06-09 DIAGNOSIS — K219 Gastro-esophageal reflux disease without esophagitis: Secondary | ICD-10-CM | POA: Diagnosis present

## 2012-06-09 DIAGNOSIS — Z9849 Cataract extraction status, unspecified eye: Secondary | ICD-10-CM | POA: Insufficient documentation

## 2012-06-09 DIAGNOSIS — F411 Generalized anxiety disorder: Secondary | ICD-10-CM | POA: Insufficient documentation

## 2012-06-09 DIAGNOSIS — F1020 Alcohol dependence, uncomplicated: Secondary | ICD-10-CM

## 2012-06-09 DIAGNOSIS — K3532 Acute appendicitis with perforation and localized peritonitis, without abscess: Secondary | ICD-10-CM

## 2012-06-09 DIAGNOSIS — Z961 Presence of intraocular lens: Secondary | ICD-10-CM | POA: Insufficient documentation

## 2012-06-09 DIAGNOSIS — Z789 Other specified health status: Secondary | ICD-10-CM | POA: Diagnosis present

## 2012-06-09 DIAGNOSIS — F13239 Sedative, hypnotic or anxiolytic dependence with withdrawal, unspecified: Secondary | ICD-10-CM

## 2012-06-09 DIAGNOSIS — F17200 Nicotine dependence, unspecified, uncomplicated: Secondary | ICD-10-CM

## 2012-06-09 DIAGNOSIS — R32 Unspecified urinary incontinence: Secondary | ICD-10-CM

## 2012-06-09 DIAGNOSIS — F19939 Other psychoactive substance use, unspecified with withdrawal, unspecified: Secondary | ICD-10-CM | POA: Diagnosis present

## 2012-06-09 DIAGNOSIS — F1210 Cannabis abuse, uncomplicated: Secondary | ICD-10-CM | POA: Diagnosis present

## 2012-06-09 DIAGNOSIS — Z79899 Other long term (current) drug therapy: Secondary | ICD-10-CM

## 2012-06-09 DIAGNOSIS — F32A Depression, unspecified: Secondary | ICD-10-CM

## 2012-06-09 DIAGNOSIS — F13230 Sedative, hypnotic or anxiolytic dependence with withdrawal, uncomplicated: Secondary | ICD-10-CM | POA: Diagnosis present

## 2012-06-09 DIAGNOSIS — H269 Unspecified cataract: Secondary | ICD-10-CM

## 2012-06-09 DIAGNOSIS — F3289 Other specified depressive episodes: Secondary | ICD-10-CM | POA: Insufficient documentation

## 2012-06-09 DIAGNOSIS — F13930 Sedative, hypnotic or anxiolytic use, unspecified with withdrawal, uncomplicated: Secondary | ICD-10-CM | POA: Diagnosis present

## 2012-06-09 DIAGNOSIS — F13939 Sedative, hypnotic or anxiolytic use, unspecified with withdrawal, unspecified: Secondary | ICD-10-CM

## 2012-06-09 DIAGNOSIS — F10229 Alcohol dependence with intoxication, unspecified: Secondary | ICD-10-CM | POA: Insufficient documentation

## 2012-06-09 DIAGNOSIS — Z7289 Other problems related to lifestyle: Secondary | ICD-10-CM | POA: Diagnosis present

## 2012-06-09 HISTORY — PX: CATARACT EXTRACTION: SUR2

## 2012-06-09 HISTORY — DX: Unspecified urinary incontinence: R32

## 2012-06-09 HISTORY — DX: Unspecified cataract: H26.9

## 2012-06-09 HISTORY — DX: Acute appendicitis with perforation and localized peritonitis, without abscess: K35.32

## 2012-06-09 HISTORY — DX: Acute appendicitis with perforation, localized peritonitis, and gangrene, without abscess: K35.32

## 2012-06-09 LAB — COMPREHENSIVE METABOLIC PANEL
AST: 27 U/L (ref 0–37)
Albumin: 4 g/dL (ref 3.5–5.2)
Alkaline Phosphatase: 107 U/L (ref 39–117)
BUN: 9 mg/dL (ref 6–23)
CO2: 26 mEq/L (ref 19–32)
Chloride: 98 mEq/L (ref 96–112)
Creatinine, Ser: 0.67 mg/dL (ref 0.50–1.10)
GFR calc non Af Amer: >90 mL/min (ref >90)
Potassium: 3.8 mEq/L (ref 3.5–5.1)
Total Bilirubin: 0.4 mg/dL (ref 0.3–1.2)

## 2012-06-09 LAB — CBC
HCT: 43.1 % (ref 36.0–46.0)
MCV: 95.8 fL (ref 78.0–100.0)
Platelets: 282 10*3/uL (ref 150–400)
RBC: 4.5 MIL/uL (ref 3.87–5.11)
WBC: 9 10*3/uL (ref 4.0–10.5)

## 2012-06-09 LAB — RAPID URINE DRUG SCREEN, HOSP PERFORMED
Amphetamines: NOT DETECTED
Barbiturates: NOT DETECTED
Benzodiazepines: NOT DETECTED

## 2012-06-09 LAB — ACETAMINOPHEN LEVEL: Acetaminophen (Tylenol), Serum: <15 ug/mL (ref 10–30)

## 2012-06-09 LAB — SALICYLATE LEVEL: Salicylate Lvl: <2 mg/dL — ABNORMAL LOW (ref 2.8–20.0)

## 2012-06-09 LAB — POCT PREGNANCY, URINE: Preg Test, Ur: NEGATIVE

## 2012-06-09 MED ORDER — CHLORDIAZEPOXIDE HCL 25 MG PO CAPS
25.0000 mg | ORAL_CAPSULE | Freq: Every day | ORAL | Status: AC
  Administered 2012-06-13: 25 mg via ORAL
  Filled 2012-06-09: qty 1

## 2012-06-09 MED ORDER — NICOTINE 21 MG/24HR TD PT24
21.0000 mg | MEDICATED_PATCH | Freq: Once | TRANSDERMAL | Status: DC
  Administered 2012-06-09: 21 mg via TRANSDERMAL
  Filled 2012-06-09: qty 1

## 2012-06-09 MED ORDER — CHLORDIAZEPOXIDE HCL 25 MG PO CAPS
25.0000 mg | ORAL_CAPSULE | Freq: Three times a day (TID) | ORAL | Status: AC
  Administered 2012-06-11 (×3): 25 mg via ORAL
  Filled 2012-06-09 (×4): qty 1

## 2012-06-09 MED ORDER — CHLORDIAZEPOXIDE HCL 25 MG PO CAPS
25.0000 mg | ORAL_CAPSULE | Freq: Four times a day (QID) | ORAL | Status: AC
  Administered 2012-06-10 (×4): 25 mg via ORAL
  Filled 2012-06-09 (×3): qty 1

## 2012-06-09 MED ORDER — ONDANSETRON 4 MG PO TBDP: 4.0000 mg | ORAL_TABLET | Freq: Four times a day (QID) | ORAL | Status: AC | PRN

## 2012-06-09 MED ORDER — PANTOPRAZOLE SODIUM 40 MG PO TBEC
80.0000 mg | DELAYED_RELEASE_TABLET | Freq: Every day | ORAL | Status: DC
  Administered 2012-06-09 – 2012-06-10 (×2): 80 mg via ORAL
  Filled 2012-06-09 (×3): qty 2

## 2012-06-09 MED ORDER — ACETAMINOPHEN 325 MG PO TABS
650.0000 mg | ORAL_TABLET | Freq: Four times a day (QID) | ORAL | Status: DC | PRN
  Administered 2012-06-10 – 2012-06-12 (×2): 650 mg via ORAL

## 2012-06-09 MED ORDER — THIAMINE HCL 100 MG/ML IJ SOLN: 100.0000 mg | Freq: Once | INTRAMUSCULAR | Status: DC

## 2012-06-09 MED ORDER — VITAMIN B-1 100 MG PO TABS
100.0000 mg | ORAL_TABLET | Freq: Every day | ORAL | Status: DC
  Administered 2012-06-10 – 2012-06-14 (×5): 100 mg via ORAL
  Filled 2012-06-09 (×6): qty 1

## 2012-06-09 MED ORDER — LORAZEPAM 1 MG PO TABS
1.0000 mg | ORAL_TABLET | Freq: Three times a day (TID) | ORAL | Status: DC | PRN
  Administered 2012-06-09: 1 mg via ORAL
  Filled 2012-06-09: qty 1

## 2012-06-09 MED ORDER — LORAZEPAM 1 MG PO TABS
1.0000 mg | ORAL_TABLET | Freq: Once | ORAL | Status: AC
  Administered 2012-06-09: 1 mg via ORAL
  Filled 2012-06-09: qty 1

## 2012-06-09 MED ORDER — LOPERAMIDE HCL 2 MG PO CAPS: 2.0000 mg | ORAL_CAPSULE | ORAL | Status: AC | PRN

## 2012-06-09 MED ORDER — CHLORDIAZEPOXIDE HCL 25 MG PO CAPS
25.0000 mg | ORAL_CAPSULE | Freq: Once | ORAL | Status: AC
  Administered 2012-06-09: 25 mg via ORAL
  Filled 2012-06-09: qty 1

## 2012-06-09 MED ORDER — HYDROXYZINE HCL 50 MG PO TABS
50.0000 mg | ORAL_TABLET | Freq: Every evening | ORAL | Status: DC | PRN
  Administered 2012-06-09: 50 mg via ORAL
  Filled 2012-06-09: qty 4

## 2012-06-09 MED ORDER — ADULT MULTIVITAMIN W/MINERALS CH
1.0000 | ORAL_TABLET | Freq: Every day | ORAL | Status: DC
  Administered 2012-06-10 – 2012-06-14 (×5): 1 via ORAL
  Filled 2012-06-09 (×6): qty 1

## 2012-06-09 MED ORDER — ALUM & MAG HYDROXIDE-SIMETH 200-200-20 MG/5ML PO SUSP
30.0000 mL | ORAL | Status: DC | PRN
  Administered 2012-06-11: 30 mL via ORAL

## 2012-06-09 MED ORDER — CHLORDIAZEPOXIDE HCL 25 MG PO CAPS
25.0000 mg | ORAL_CAPSULE | Freq: Four times a day (QID) | ORAL | Status: AC | PRN
  Administered 2012-06-10: 25 mg via ORAL
  Filled 2012-06-09: qty 1

## 2012-06-09 MED ORDER — ESCITALOPRAM OXALATE 10 MG PO TABS
10.0000 mg | ORAL_TABLET | Freq: Every day | ORAL | Status: DC
  Administered 2012-06-10 – 2012-06-14 (×5): 10 mg via ORAL
  Filled 2012-06-09 (×6): qty 1

## 2012-06-09 MED ORDER — HYDROXYZINE HCL 25 MG PO TABS
25.0000 mg | ORAL_TABLET | Freq: Four times a day (QID) | ORAL | Status: AC | PRN
  Administered 2012-06-12: 25 mg via ORAL
  Filled 2012-06-09: qty 1

## 2012-06-09 MED ORDER — CHLORDIAZEPOXIDE HCL 25 MG PO CAPS
25.0000 mg | ORAL_CAPSULE | ORAL | Status: AC
  Administered 2012-06-12 (×2): 25 mg via ORAL
  Filled 2012-06-09 (×2): qty 1

## 2012-06-09 MED ORDER — MAGNESIUM HYDROXIDE 400 MG/5ML PO SUSP
30.0000 mL | Freq: Every day | ORAL | Status: DC | PRN
  Administered 2012-06-11: 30 mL via ORAL

## 2012-06-09 NOTE — Tx Team (Signed)
Initial Interdisciplinary Treatment Plan  PATIENT STRENGTHS: (choose at least two) Ability for insight Average or above average intelligence Capable of independent living Communication skills General fund of knowledge Motivation for treatment/growth Physical Health Supportive family/friends  PATIENT STRESSORS: Financial difficulties Marital or family conflict Substance abuse   PROBLEM LIST: Problem List/Patient Goals Date to be addressed Date deferred Reason deferred Estimated date of resolution  Depression 12/27     Polysubstance 12/27                                                DISCHARGE CRITERIA:  Ability to meet basic life and health needs Adequate post-discharge living arrangements Improved stabilization in mood, thinking, and/or behavior Withdrawal symptoms are absent or subacute and managed without 24-hour nursing intervention  PRELIMINARY DISCHARGE PLAN: Outpatient therapy Return to previous living arrangement  PATIENT/FAMIILY INVOLVEMENT: This treatment plan has been presented to and reviewed with the patient, Karen Casey, and/or family member, .  The patient and family have been given the opportunity to ask questions and make suggestions.  Karen Casey [facility] 06/09/2012, 8:45 PM

## 2012-06-09 NOTE — Progress Notes (Signed)
Patient ID: Karen Casey, female   DOB: [birth year], 56 y.o.   MRN: [id number]  Patient is a 56 yr old voluntary admission. She was initially a walk-in. Here to detox from alcohol and ativan. Patient states that she had a seizure in November from going "cold [place]". Reports drinking at least 4-8 beers per day and taking ativan for anxiety to replace the xanax that she was taking. Hx of GERD, cataracts ( lt one removed), and depression. Denies any SI, HI, or a/v hallucinations. Family wanted her to come in but knows that she needs to stop drinking. Cooperative with admission.

## 2012-06-09 NOTE — BH Assessment (Signed)
Assessment Note   Karen Casey is a 56 y.o. single white female.  She presents with her sister, Sandy Salaam, who remained for assessment with verbal consent of pt.  Pt is seeking detoxification from alcohol and Ativan at the recommendation of Charmian Muff, her outpt therapist, as well as her sister and daughter.  Pt reports several stressors in the past year.  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  She now lives in a neighborhood that she does not consider to be safe.  Pt reports long standing problems with depression and anxiety, and had been receiving Xanax from Dr Evelene Croon from 2005 - 2012.  However, when she lost her job she also lost her insurance and had to discontinue seeing Dr Evelene Croon.  For the past year she has received Ativan from her PCP, with each prescription providing only a one week supply.  She reports that she has been using the Ativan as prescribed.  However, she has also been drinking 6 - 8 beers on a daily basis for the past 3 months.  On 04/15/2012 pt went into withdrawal and had her first and only seizure, resulting in a trip to the ED.  Today she presents exhibiting withdrawal symptoms as noted in the "Additional Social History" section below, yielding a CIWA of 13.  Her blood pressure was taken twice following the assessment with results being 188/109 @ 11:20, and 107/104 @ 11:42.  Pt endorses current depression with symptoms noted in the "risk to self" assessment below.  She denies current or past SI, HI, or AH/VH, and she demonstrates no evidence of delusional thought.  She has never been hospitalized for mental health or substance abuse treatment.  She has only seen Charmian Muff for a single visit 2 weeks ago, and Dewayne Hatch is recommending that she enroll in the [facility] program after she completes detox.  Pt also reports a history of participation in Alcoholics Anonymous  meetings as recently as 06/05/2012.  She would like to be admitted to [facility] at this time for detox.  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 GERD (gastroesophageal reflux disease)   . Anxiety   . Depression   . Cataract 06/09/2012    Right eye  . Urinary incontinence 06/09/2012  . 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    Event occurred in 2007   Axis IV: economic problems, housing problems, occupational problems and problems with access to health care services Axis V: GAF = 40  Past Medical History:  Past Medical History  Diagnosis Date  . GERD (gastroesophageal reflux disease)   . Anxiety   . Depression   . Cataract 06/09/2012    Right eye  . Urinary incontinence 06/09/2012  . 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    Event occurred in 2007    Past Surgical History  Procedure Date  . Appendectomy   . Left shoulder dislocation Sept 2011  . Cataract extraction 06/09/2012    Left eye    Family History:  Family History  Problem Relation Age of Onset  . Hypertension Mother   . Hyperlipidemia Mother   . Heart disease Father   . Depression Father   . Parkinsonism Father     Social History:  reports that she has been smoking Cigarettes.  She has a 18.5 pack-year smoking history. She has never used smokeless tobacco. She reports that she drinks about .6 ounces of alcohol per week. She reports that she uses illicit drugs (Benzodiazepines).  Additional Social History:  Alcohol / Drug Use Pain Medications: Denies Prescriptions: Ativan, reportedly used as prescribed Over the Counter: Denies Longest period of sobriety (when/how long): 6 days in 2007 at time of rupture of appendix Negative Consequences of Use: Legal;Personal relationships;Work / Programmer, multimedia (DUI @ 23 or 56 y/o) Withdrawal Symptoms: Nausea /  Vomiting;Seizures;Tingling;Tremors;Sweats;Fever / Chills (Only seizure: 04/15/2012) Onset of Seizures: 04/15/2012 Date of most recent seizure: 04/15/2012 (one time event) Substance #1 Name of Substance 1: Alcohol (beer) 1 - Age of First Use: 56 y/o 1 - Amount (size/oz): 72 - 96 oz 1 - Frequency: Daily 1 - Duration: 3 months 1 - Last Use / Amount: 5 beers on 06/08/2012 Substance #2 Name of Substance 2: Ativan or other benzodiazepines 2 - Age of First Use: 47 y/o (father's Valium) 2 - Amount (size/oz): 1 mg 2 - Frequency: TID 2 - Duration: Started on Xanax in 2005; changed to Ativan about 1 year ago. 2 - Last Use / Amount: 1 mg 06/08/2012 @ 14:00  CIWA: CIWA-Ar BP: 173/104 mmHg Pulse Rate: 92  Nausea and Vomiting: mild nausea with no vomiting Tactile Disturbances: very mild itching, pins and needles, burning or numbness Tremor: two Auditory Disturbances: not present Paroxysmal Sweats: barely perceptible sweating, palms moist Visual Disturbances: not present Anxiety: five Headache, Fullness in Head: mild Agitation: somewhat more than normal activity Orientation and Clouding of Sensorium: oriented and can do serial additions CIWA-Ar Total: 13  COWS:    Allergies: No Known Allergies  Home Medications:  (Not in a hospital admission)  OB/GYN Status:  No LMP recorded. Patient is postmenopausal.  General Assessment Data Location of Assessment: [facility]: [facility] Assessment Services Living Arrangements: Spouse/significant other (Significant other) Can pt return to current living arrangement?: Yes Admission Status: Voluntary Is patient capable of signing voluntary admission?: Yes Transfer from: Home Referral Source: Self/Family/Friend  Education Status Is patient currently in school?: No  Risk to self Suicidal Ideation: No Suicidal Intent: No Is patient at risk for suicide?: No Suicidal Plan?: No Access to Means: No What has been your use of drugs/alcohol within the last 12 months?:  Alcohol, Benzodiazepines Previous Attempts/Gestures: No How many times?: 0  Other Self Harm Risks: None Triggers for Past Attempts: Other (Comment) (Not applicable) Intentional Self Injurious Behavior: None Family Suicide History: No (Father - probable depression; Both parents on Valium) Recent stressful life event(s): Financial Problems;Job Loss;Other (Comment) (Unemployed x 1 yr; Home foreclosed 10/2011; bad neighborhood) Persecutory voices/beliefs?: No Depression: Yes Depression Symptoms: Insomnia;Tearfulness;Isolating;Fatigue;Guilt;Loss of interest in usual pleasures;Feeling worthless/self pity;Feeling angry/irritable Substance abuse history and/or treatment for substance abuse?: Yes (Alcohol, Benzodiazepines) Suicide prevention information given to non-admitted patients: Yes  Risk to Others Homicidal Ideation: No Thoughts of Harm to Others: No Current Homicidal Intent: No Current Homicidal Plan: No Access to Homicidal Means: No Identified Victim: None History of harm to others?: No Assessment of Violence: None Noted Violent Behavior Description: Anxious but cooperative Does patient have access to weapons?: No (Denies having guns) Criminal Charges Pending?: No Does patient have a court date: No  Psychosis Hallucinations: None noted Delusions: None noted  Mental Status Report Appear/Hygiene: Other (Comment) (Casual) Eye Contact: Poor Motor Activity: Restlessness (Fidgeting) Speech: Other (Comment) (Unremarkable) Level of Consciousness: Alert Mood: Depressed;Anxious;Other (Comment) (Tearful) Affect: Inconsistent  with thought content (Incongruent laughter intermittently) Anxiety Level: Moderate Thought Processes: Coherent;Circumstantial Judgement: Unimpaired Orientation: Person;Place;Time;Situation Obsessive Compulsive Thoughts/Behaviors: Minimal (Checking for car keys)  Cognitive Functioning Concentration: Decreased Memory: Recent Intact;Remote Intact IQ:  Average Insight: Good Impulse Control: Fair Appetite: Fair Weight Loss: 70  (over past 2 - 3 years, somewhat intentional) Weight Gain: 0  Sleep: Decreased Total Hours of Sleep: 4  (past 3 months) Vegetative Symptoms: Staying in bed;Not bathing  ADLScreening [facility] Assessment Services) Patient's cognitive ability adequate to safely complete daily activities?: Yes Patient able to express need for assistance with ADLs?: Yes Independently performs ADLs?: Yes (appropriate for developmental age) (Fall 1 mo ago due to intoxication & cataract.)  Abuse/Neglect [facility]) Physical Abuse: Denies Verbal Abuse: Denies Sexual Abuse: Denies  Prior Inpatient Therapy Prior Inpatient Therapy: No Prior Therapy Dates: None  Prior Therapy Facilty/Provider(s): None  Reason for Treatment: None   Prior Outpatient Therapy Prior Outpatient Therapy: Yes Prior Therapy Dates: 2005 - 2012: Dr Evelene Croon for depression & anxiety Prior Therapy Facilty/Provider(s): 2 weeks ago (single visit): Charmian Muff for substance abuse Reason for Treatment: Most recently 06/05/2012: Alcoholics Anonymous meetings.  ADL Screening (condition at time of admission) Patient's cognitive ability adequate to safely complete daily activities?: Yes Patient able to express need for assistance with ADLs?: Yes Independently performs ADLs?: Yes (appropriate for developmental age) (Fall 1 mo ago due to intoxication & cataract.) Weakness of Legs: None Weakness of Arms/Hands: None  Home Assistive Devices/Equipment Home Assistive Devices/Equipment: Eyeglasses    Abuse/Neglect Assessment (Assessment to be complete while patient is alone) Physical Abuse: Denies Verbal Abuse: Denies Sexual Abuse: Denies Exploitation of patient/patient's resources: Denies Self-Neglect: Denies Values / Beliefs Cultural Requests During Hospitalization: Other (comment) (Hx of participation in 12-Step meetings.)   Advance Directives (For Healthcare) Advance  Directive: Patient does not have advance directive;Patient would like information Patient requests advance directive information: Advance directive packet given Pre-existing out of facility DNR order (yellow form or pink MOST form): No Nutrition Screen- [facility] Adult/WL/AP Patient's home diet: Regular Have you recently lost weight without trying?: No Have you been eating poorly because of a decreased appetite?: Yes Malnutrition Screening Tool Score: 1   Additional Information 1:1 In Past 12 Months?: No CIRT Risk: No Elopement Risk: No Does patient have medical clearance?: No     Disposition:  Disposition Disposition of Patient: Other dispositions (Transfer to [facility] for medical clearance.) Type of outpatient treatment:  (Transfer to [facility] for medical clearance.) Other disposition(s): Other (Comment) (Transfer to [facility] for medical clearance.) After consulting with Shuvon Rankin it was determined that pt would benefit from hospitalization for detoxification.  However, pt first needs to be medically cleared.  Pt agrees to this.  At 11:53 I called Diane, RN, charge nurse at [facility] and gave report.  At 12:00 I spoke to Security to request transport.  At 12:10 I spoke to Melynda Ripple, Assessment Counselor to notify her.  At 12:26 pt departed by Security with Gillis Santa, MHT accompanying.  On Site Evaluation by:   Reviewed with Physician:  Assunta Found, FNP @ 11:45   Raphael Gibney 06/09/2012 2:03 PM

## 2012-06-09 NOTE — ED Notes (Signed)
Thomas at [facility] has called back and will be calling [place] to have pt consent to transfer and she will call us back to give bed assignment.  Pt is in lobby, cooperative and sitting next to her sister.

## 2012-06-09 NOTE — ED Notes (Signed)
Called to [facility].  All labs are back on the pt.  Thomas at [facility] will talk to the PA there and work on a bed.  Our PA here will talk w/pt.  Maisie Fus has asked for pt to be dosed w/ativan prior to going over there.  Pt in no distress in waiting room and had been siting w/her sister.

## 2012-06-09 NOTE — ED Notes (Signed)
Called Thomas at [facility] again who will check on the bed status and get back to [place].

## 2012-06-09 NOTE — Telephone Encounter (Signed)
Clinical Child psychotherapist (CSW) contacted pt to provide substance abuse resources. Pt significant other, Dorinda Hill answered the phone and informed CSW that pt admitted herself into [facility] today for substance abuse treatment. CSW encouraged spouse to have pt contact CSW when she is discharged from [facility].  Theresia Bough, MSW, Theresia Majors [phone]

## 2012-06-09 NOTE — ED Provider Notes (Signed)
History     CSN: [id number]  Arrival date & time 06/09/12  1235   First MD Initiated Contact with Patient 06/09/12 1556      Chief Complaint  Patient presents with  . Medical Clearance    (Consider location/radiation/quality/duration/timing/severity/associated sxs/prior treatment) HPI Comments: Patient presents with complaints of alcoholism in requesting detox from alcohol. Patient drinks 4-8 beers a day. She states that it has interfered with several different aspects of her life and she needs to quit. She states that she had a withdrawal seizure 2 months ago after abruptly stopping Xanax. Her only medical complaints currently are a chronic cataract, depression, diarrhea. She denies abdominal pain or fever. No chest pain or shortness of breath. She is tearful during the interview. Onset gradual. Course is constant. Patient denies suicidal or homicidal ideation. Nothing makes symptoms better or worse.  The history is provided by the patient.    Past Medical History  Diagnosis Date  . GERD (gastroesophageal reflux disease)   . Anxiety   . Depression   . Cataract 06/09/2012    Right eye  . Urinary incontinence 06/09/2012  . 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    Event occurred in 2007    Past Surgical History  Procedure Date  . Appendectomy   . Left shoulder dislocation Sept 2011  . Cataract extraction 06/09/2012    Left eye    Family History  Problem Relation Age of Onset  . Hypertension Mother   . Hyperlipidemia Mother   . Heart disease Father   . Depression Father   . Parkinsonism Father     History  Substance Use Topics  . Smoking status: Current Every Day Smoker -- 0.5 packs/day for 37 years    Types: Cigarettes  . Smokeless tobacco: Never Used  . Alcohol Use: 0.6 oz/week    1 Cans of beer per week    OB History    Grav Para Term Preterm Abortions TAB SAB Ect Mult Living                  Review of Systems  Constitutional: Negative for fever.  HENT:  Negative for sore throat and rhinorrhea.   Eyes: Negative for redness.  Respiratory: Negative for cough.   Cardiovascular: Negative for chest pain.  Gastrointestinal: Positive for diarrhea. Negative for nausea, vomiting and abdominal pain.  Genitourinary: Negative for dysuria.  Musculoskeletal: Negative for myalgias.  Skin: Negative for rash.  Neurological: Negative for headaches.  Psychiatric/Behavioral: Negative for suicidal ideas. The patient is nervous/anxious.     Allergies  Review of patient's allergies indicates no known allergies.  Home Medications   Current Outpatient Rx  Name  Route  Sig  Dispense  Refill  . ESCITALOPRAM OXALATE 10 MG PO TABS   Oral   Take 1 tablet (10 mg total) by mouth daily.   30 tablet   1   . LORAZEPAM 2 MG PO TABS   Oral   Take 1 tablet (2 mg total) by mouth every 8 (eight) hours as needed. Do not fill until 06/09/12.   21 tablet   0   . OMEPRAZOLE 20 MG PO CPDR   Oral   Take 2 capsules (40 mg total) by mouth daily.   60 capsule   1     BP 134/80  Pulse 100  Temp 98.4 F (36.9 C) (Oral)  Resp 18  SpO2 98%  Physical Exam  Nursing note and vitals reviewed. Constitutional: She appears well-developed and well-nourished.  HENT:  Head: Normocephalic and atraumatic.  Eyes: Conjunctivae normal are normal. Right eye exhibits no discharge. Left eye exhibits no discharge.  Neck: Normal range of motion. Neck supple.  Cardiovascular: Normal rate, regular rhythm and normal heart sounds.   Pulmonary/Chest: Effort normal and breath sounds normal.  Abdominal: Soft. There is no tenderness.  Neurological: She is alert.  Skin: Skin is warm and dry.  Psychiatric: Her mood appears anxious. She exhibits a depressed mood.       Tearful.    ED Course  Procedures (including critical care time)  Labs Reviewed  COMPREHENSIVE METABOLIC PANEL - Abnormal; Notable for the following:    Glucose, Bld 117 (*)     All other components within normal  limits  SALICYLATE LEVEL - Abnormal; Notable for the following:    Salicylate Lvl <2.0 (*)     All other components within normal limits  URINE RAPID DRUG SCREEN (HOSP PERFORMED) - Abnormal; Notable for the following:    Tetrahydrocannabinol POSITIVE (*)     All other components within normal limits  ACETAMINOPHEN LEVEL  CBC  ETHANOL  POCT PREGNANCY, URINE   No results found.   1. Alcohol addiction     4:10 PM Patient seen and examined. Results reviewed. Medications ordered. She is medically cleared.   Vital signs reviewed and are as follows: Filed Vitals:   06/09/12 1332  BP: 134/80  Pulse: 100  Temp: 98.4 F (36.9 C)  Resp: 18   Nurse to call ACT for update on placement at [facility].    MDM  Pending placement.         Renne Crigler, [place] 06/10/12 [phone]

## 2012-06-09 NOTE — ED Notes (Addendum)
Pt from [facility] needing lab work done to medically clear her to go back.  Pt states that she "had a seizure and needs to make sure that she is fine".  This Clinical research associate was told by the charge nurse that the pt can have her labs drawn and sit out in the waiting room.

## 2012-06-09 NOTE — Progress Notes (Signed)
Psychoeducational Group Note  Date:  06/09/2012 Time:  2000  Group Topic/Focus:  AA group  Participation Level:  Active  Participation Quality:  Appropriate  Affect:  Appropriate  Cognitive:  Appropriate  Insight:  Engaged  Engagement in Group:  Engaged  Additional CommentsFlonnie Hailstone 06/09/2012, 10:37 PM

## 2012-06-10 DIAGNOSIS — F1320 Sedative, hypnotic or anxiolytic dependence, uncomplicated: Principal | ICD-10-CM

## 2012-06-10 DIAGNOSIS — F1020 Alcohol dependence, uncomplicated: Secondary | ICD-10-CM

## 2012-06-10 DIAGNOSIS — F411 Generalized anxiety disorder: Secondary | ICD-10-CM

## 2012-06-10 DIAGNOSIS — F19939 Other psychoactive substance use, unspecified with withdrawal, unspecified: Secondary | ICD-10-CM

## 2012-06-10 MED ORDER — NICOTINE 14 MG/24HR TD PT24
MEDICATED_PATCH | TRANSDERMAL | Status: AC
  Administered 2012-06-10: 09:00:00
  Filled 2012-06-10: qty 1

## 2012-06-10 MED ORDER — TRAZODONE HCL 100 MG PO TABS
100.0000 mg | ORAL_TABLET | Freq: Every day | ORAL | Status: DC
  Administered 2012-06-10 – 2012-06-13 (×4): 100 mg via ORAL
  Filled 2012-06-10 (×5): qty 1

## 2012-06-10 MED ORDER — ESCITALOPRAM OXALATE 10 MG PO TABS: 10.0000 mg | ORAL_TABLET | Freq: Every day | ORAL | Status: DC

## 2012-06-10 MED ORDER — PANTOPRAZOLE SODIUM 40 MG PO TBEC
80.0000 mg | DELAYED_RELEASE_TABLET | Freq: Every day | ORAL | Status: DC
  Administered 2012-06-11 – 2012-06-14 (×4): 80 mg via ORAL
  Filled 2012-06-10 (×5): qty 2

## 2012-06-10 MED ORDER — PANTOPRAZOLE SODIUM 40 MG PO TBEC
80.0000 mg | DELAYED_RELEASE_TABLET | Freq: Every day | ORAL | Status: DC
  Filled 2012-06-10 (×2): qty 2

## 2012-06-10 NOTE — Progress Notes (Signed)
Patient ID: Karen Casey, female   DOB: [birth year], 56 y.o.   MRN: [id number] D: No changes in pt's condition from previous assessment. Patient in bed sleeping. Respiration regular and unlabored. No sign of distress noted at this time A: 15 mins checks for  safety R: Patient remains asleep.

## 2012-06-10 NOTE — ED Provider Notes (Signed)
Medical screening examination/treatment/procedure(s) were performed by non-physician practitioner and as supervising physician I was immediately available for consultation/collaboration.    Daisean Brodhead L Digna Countess, MD 06/10/12 1509 

## 2012-06-10 NOTE — Progress Notes (Signed)
Head lengthy 1:1 with patient who is tearful at times. Continues to make self deprecating statements. Expressing shame over losing job and home. Does report supportive husband, daughter and sister. Pt displays forgetfulness throughout conversation, having difficulty remembering the two medications she is taking. Pt's CIWA is "4" with persistent headache. VS improved after 1830 Librium.  Reoriented as needed. Support and encouragement given. Medicated with tylenol. She denies SI/HI/AVH and remains safe. Lawrence Marseilles

## 2012-06-10 NOTE — [facility] Counselor (Signed)
Adult Comprehensive Assessment  Patient ID: Karen Casey, female   DOB: Dec 27, 1955, 56 y.o.   MRN: [id number]  Information Source: Information source: Patient  Current Stressors:  Educational / Learning stressors: Cannot concentrate, cannot spell Employment / Job issues: Has lost her job due to addiction issues -- has no income,  Has never been without a job before.  Does not want to depend on anyone else. Family Relationships: Sometimes can be stressful when brother-in-law controls money, patient feels she is impacting the relationship between sister and her husband Surveyor, quantity / Lack of resources (include bankruptcy): No income or resources (both patient and significant other lost jobs), supported by sister, patient admits to lying about using her sister's money for necessities when was really using for alcohol Housing / Lack of housing: Has lost her nice home due to her addiction, now lives in a tiny apartment in a bad neighborhood Physical health (include injuries & life threatening diseases): Cataract (has been approved through DSS to get this removed for $5); anxiety, depression, and stomach (acid reflux).  Supposed to go to gastroenterologist, but did not go due to insurance/income issues.  Also had a seizure getting off alcohol several days ago. Social relationships: Don't have very many, so more of a stressor than in the past. Substance abuse: Xanax and alcohol, the abuse has affected her income, her housing, her hygiene Bereavement / Loss: Not in the last few years.  Living/Environment/Situation:  Living Arrangements: Spouse/significant other (Significant other 26 years living together) Living conditions (as described by patient or guardian): Lost home because of addiction, so this duplex is in a bad part of town.  Significant other is severely depressed - in the past they both made good money, but now they are struggling How long has patient lived in current situation?: 26 years with  significant other, since May in current apartment, used to have a nice home with a swimming pool, golf cart What is atmosphere in current home: Loving;Supportive;Other (Comment) (Depressing)  Family History:  Marital status: Long term relationship Long term relationship, how long?: 26 years living together What types of issues is patient dealing with in the relationship?: He has severe depression, they do not have income, he has  been trying to hide her alcohol from her, they have had to move into substandard housing Does patient have children?: Yes (26yo daughter) How many children?: 1  How is patient's relationship with their children?: Wonderful at some moments, other times patient aggravates daughter from overprotection  Childhood History:  By whom was/is the patient raised?: Both parents Additional childhood history information: "Ozzie and Berton Mount" parents Description of patient's relationship with caregiver when they were a child: Wonderful, awesome parents, ideal Patient's description of current relationship with people who raised him/her: They both died 6 months apart, mother died from brain aneurysm and father with Parkinson's, heart problems - when patient was in her 30s Does patient have siblings?: Yes Number of Siblings: 1  (sister) Description of patient's current relationship with siblings: "Wonderful for the most part, it can be straining.  She is supporting me. But her husband is very controlling over money." Did patient suffer any verbal/emotional/physical/sexual abuse as a child?: No Did patient suffer from severe childhood neglect?: No Has patient ever been sexually abused/assaulted/raped as an adolescent or adult?: No Was the patient ever a victim of a crime or a disaster?: No Witnessed domestic violence?: No Has patient been effected by domestic violence as an adult?: No  Education:  Highest grade  of school patient has completed: Some college Currently a student?:  No Learning disability?: Yes What learning problems does patient have?: ADHD, focus issues  Employment/Work Situation:   Employment situation: Unemployed What is the longest time patient has a held a job?: 25 years 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 Has patient ever been in the [employer]?: No Has patient ever served in Buyer, retail?: No  Financial Resources:   Financial resources: No income;Food stamps (Also has [employer] until October) Does patient have a Lawyer or guardian?: No  Alcohol/Substance Abuse:   What has been your use of drugs/alcohol within the last 12 months?: Alcohol daily, amount varies drastically; Addicted to Xanax, quit taking them in October but was put on At[facility] instead at [facility] If attempted suicide, did drugs/alcohol play a role in this?: No Alcohol/Substance Abuse Treatment Hx: Denies past history Has alcohol/substance abuse ever caused legal problems?: Yes (DUI when 22-something)  Social Support System:   Patient's Community Support System: Good Describe Community Support System: Significant other (Donald), daughter, brother-in-law, sister, best friend since age of 64 Type of faith/religion: Methodist How does patient's faith help to cope with current illness?: Uplifting to go to church, strong belief in God  Leisure/Recreation:   Leisure and Hobbies: Used to go to football games, loves flowers, used to have a big garden, loves to read, be with friend Aram Beecham, go out to eat, loves spending time with grand-dog  Strengths/Needs:   What things does the patient do well?: Talking, making people happy, trying to get my life back together, taking care of neighbors and others, babies her significant other In what areas does patient struggle / problems for patient: Getting back to work, never have "not worked" before, possibly attention deficit, anxiety, depression, not being able to see, not  having my home any more, not having my elderly neighbors to go see that I used to take care of  Discharge Plan:   Does patient have access to transportation?: Yes Will patient be returning to same living situation after discharge?: Yes Currently receiving community mental health services: No If no, would patient like referral for services when discharged?: Yes (What county?) [facility], used to go see Dr. Evelene Croon when had insurance) Does patient have financial barriers related to discharge medications?: Yes Patient description of barriers related to discharge medications: Has no income, only sister's help.  [facility]  Also has [employer] which expires in October.  Summary/Recommendations:   Summary and Recommendations (to be completed by the evaluator): This is a 56yo Caucasian female who is here for detox from alcohol.  She has been drinking alcohol for many years, and this eventually caused her to lose her job and her extremely nice home with swimming pool, golf cart, etc.  She was put on Xanax by Dr. Evelene Croon, who increased it over time.  She stopped seeing Dr. Evelene Croon due to loss of income/insurance, stopped taking the Xanax, but then was put on Ativan by [facility].  She lives with her significant other of 26 years in substandard housing, supported by her sister/brother-in-law.  She feels that she suffers from depression, anxiety, focus issues, and wants referral for services when she leaves the hospital.  She does have an [employer], but no other resources.  She would benefit from safety monitoring, medication evaluation, psychoeducation, group therapy, and discharge planning to link with  ongoing resources.   Sarina Ser. 06/10/2012

## 2012-06-10 NOTE — Progress Notes (Signed)
Goals  Group Note  Date:  06/10/2012 Time:  0900 Group Topic/Focus:  Identifying  Goals : The focus of the group is to identify goals the patient wants to begin to work towards, to introduce the Saturday Patient Workbooks to the patient and to motivate the patient to take the necessary steps, they need to take, in order to become healthier.  Participation Level:  active Participation Quality: good Affect: flat Cognitive:    Insight:  good  Engagement in Group: engaged  Additional Comments:   PDuke RN [employer]

## 2012-06-10 NOTE — Progress Notes (Signed)
Patient did attend the evening speaker AA meeting.  

## 2012-06-10 NOTE — Clinical Social Work Psychosocial (Signed)
[facility] Group Notes:  (Clinical Social Work)  06/10/2012  10:00-11:00AM  Summary of Progress/Problems:   The main focus of today's process group was for the patient to identify ways in which they have in the past sabotaged their own recovery and reasons they may have done this/what they received from doing it.  We then worked to identify a specific plan to avoid doing this when discharged from the hospital for this admission.  The patient expressed little, as she was late to group.  However, she nodded many times and seemed to be absorbing everything in group.  Type of Therapy:  Group Therapy - Process  Participation Level:  Active  Participation Quality:  Appropriate, Attentive and Supportive  Affect:  Depressed and Tearful  Cognitive:  Oriented  Insight:  Improving  Engagement in Therapy:  Improving  Modes of Intervention:  Clarification, Education, Limit-setting, Problem-solving, Socialization, Support and Processing, Exploration, Discussion   Ambrose Mantle, LCSW 06/10/2012, 1:00 PM

## 2012-06-10 NOTE — Progress Notes (Signed)
Psychoeducational Group Note  Date:  06/10/2012 Time:   1300  Group Topic/Focus:  Identifying Needs:   The focus of this group is to help patients identify their personal needs that have been historically problematic and identify healthy behaviors to address their needs.  Participation Level:  active Participation Quality: good Affect: flat Cognitive:good    Insight:  good  Engagement in Group: engaged  Additional Comments:   PD RN [facility]

## 2012-06-10 NOTE — [facility] Suicide Risk Assessment (Signed)
Suicide Risk Assessment  Admission Assessment     Demographic factors:  Assessment Details Time of Assessment: Admission Information Obtained From: Patient Current Mental Status:  Current Mental Status: NA Loss Factors:  Loss Factors: Financial problems / change in socioeconomic status Historical Factors:  Historical Factors: Family history of mental illness or substance abuse Risk Reduction Factors:  Risk Reduction Factors: Sense of responsibility to family;Living with another person, especially a relative;Positive therapeutic relationship  CLINICAL FACTORS:   Depression:   Anhedonia Comorbid alcohol abuse/dependence Insomnia Recent sense of peace/wellbeing Severe Alcohol/Substance Abuse/Dependencies  COGNITIVE FEATURES THAT CONTRIBUTE TO RISK:  Loss of executive function    SUICIDE RISK:   Minimal: No identifiable suicidal ideation.  Patients presenting with no risk factors but with morbid ruminations; may be classified as minimal risk based on the severity of the depressive symptoms  PLAN OF CARE: 1  Pt is on detox protacol for withdrawal from Xanax [ had a seizure for abrupt withdrawal] and alcohol 4-10 beers 12/oz/day   2.  Pt had lost job and home due to alcohol and xanax. 3.  Pt will participate in groups activities 4.  She will report any detox. Symptoms 5.  She will report any suicidal thoughts 6.  She will report any suicidal thoughts at least two wks before discharge.  Kami Kube 06/10/2012, 12:05 PM

## 2012-06-10 NOTE — H&P (Signed)
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 She was sent to [facility] for medical clearance. She reports drinking 2-10 beers a day for 3 months. Karen Casey was taking 6 xanax 2 mg a day that she was getting from her psychiatrist Dr. Evelene Croon.  Her last xanax was in October and resulted in her having a withdrawal seizure.  Her last alcohol was 2 days ago, but she had stopped 5 days from Dec 12 -Dec 17th.   Elements:  Location:  In patient admission. Quality:  moderate . Severity:  chronic. Timing:  3months. Duration:  2005 was the start of her treatment of depression. Context:  lost her home, her job, due to alcohol. Associated Signs/Synptoms: Depression Symptoms:  depressed mood, anhedonia, insomnia, fatigue, feelings of worthlessness/guilt, difficulty concentrating, hopelessness, impaired memory, anxiety, panic attacks, loss of energy/fatigue, disturbed sleep, weight loss, (Hypo) Manic Symptoms:  Distractibility, Anxiety Symptoms:  Excessive Worry, Panic Symptoms, Psychotic Symptoms:  denies PTSD Symptoms: denies  Psychiatric Specialty Exam: Physical Exam  HENT:  Head: Normocephalic.  Psychiatric: Her speech is normal and behavior is normal. Judgment and thought content normal. Her mood appears anxious. Her affect is blunt. Her affect is not angry, not labile and not inappropriate. Cognition and memory are impaired. She exhibits a depressed mood. She exhibits normal recent memory and normal remote memory.    Review of Systems  Constitutional: Positive for diaphoresis. Negative for fever, chills, weight loss and malaise/fatigue.   HENT: Positive for congestion. Negative for hearing loss, ear pain, nosebleeds, sore throat, tinnitus and ear discharge.   Eyes: Positive for blurred vision, photophobia and redness. Negative for double vision, pain and discharge.  Respiratory: Positive for cough (1/2 PPD smoker x 25+ years). Negative for hemoptysis, sputum production, shortness of breath, wheezing and stridor.   Cardiovascular: Positive for palpitations. Negative for chest pain, orthopnea, claudication, leg swelling and PND.  Gastrointestinal: Positive for heartburn, abdominal pain and diarrhea. Negative for nausea, vomiting, constipation, blood in stool and melena.  Genitourinary: Negative.   Musculoskeletal: Negative.   Skin: Negative for itching and rash.  Neurological: Positive for tingling, seizures and headaches. Negative for dizziness, tremors, sensory change, speech change (with benzodiazepine withdrawal), loss of consciousness and weakness.  Endo/Heme/Allergies: Negative for environmental allergies. Polydipsia: cotton mouthed today. Bruises/bleeds easily.  Psychiatric/Behavioral: Positive for depression, memory loss and substance abuse. Negative for suicidal ideas and hallucinations. The patient has insomnia.     Blood pressure 128/83, pulse 123, temperature 97.3 F (36.3 C), temperature source Oral, resp. rate 18, height 5' 4.96" (1.65 m), weight 67.586 kg (149 lb).Body mass index is 24.82 kg/(m^2).  General Appearance: Casual  Eye Contact::  Good  Speech:  Clear and Coherent  Volume:  Normal  Mood:  Anxious and Depressed  Affect:  Congruent  Thought Process:  Circumstantial  Orientation:  Full (Time, Place, and Person)  Thought Content:  Negative  Suicidal Thoughts:  No  Homicidal Thoughts:  No  Memory:  Immediate;   Fair Recent;   Fair Remote;   Fair  Judgement:  Fair  Insight:  Present  Psychomotor Activity:  Normal  Concentration:  Fair  Recall:  Fair  Akathisia:  No  Handed:  Right  AIMS (if  indicated):     Assets:  Communication Skills Desire for Improvement Housing Physical Health Social Support Talents/Skills  Sleep:  Number of Hours: 5     Past Psychiatric History: Diagnosis:  MDD 2005  Hospitalizations:  none  Outpatient Care:   Dr. Evelene Croon  Substance Abuse Care:  none  Self-Mutilation: none  Suicidal Attempts: none  Violent Behaviors:  none   Past Medical History:   Past Medical History  Diagnosis Date  . GERD (gastroesophageal reflux disease)   . Anxiety   . Depression   . Cataract 06/09/2012    Right eye  . Urinary incontinence 06/09/2012  . 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    Event occurred in 2007   None. Seizure History:  with benzodiazepine withdrawal Allergies:  No Known Allergies PTA Medications: Prescriptions prior to admission  Medication Sig Dispense Refill  . escitalopram (LEXAPRO) 10 MG tablet Take 1 tablet (10 mg total) by mouth daily.  30 tablet  1  . LORazepam (ATIVAN) 2 MG tablet Take 1 tablet (2 mg total) by mouth every 8 (eight) hours as needed. Do not fill until 06/09/12.  21 tablet  0  . omeprazole (PRILOSEC) 20 MG capsule Take 2 capsules (40 mg total) by mouth daily.  60 capsule  1    Previous Psychotropic Medications:  Medication/Dose  prozac   xanax             Substance Abuse History in the last 12 months:  yes  Consequences of Substance Abuse: lost her job due to alcoholism and xanax  Social History:  reports that she has been smoking Cigarettes.  She has a 18.5 pack-year smoking history. She has never used smokeless tobacco. She reports that she drinks about .6 ounces of alcohol per week. She reports that she uses illicit drugs (Benzodiazepines). Additional Social History: Pain Medications: Denies Prescriptions: Ativan, reportedly used as prescribed Over the Counter: Denies History of alcohol / drug use?: Yes Longest period of sobriety (when/how long): 6 days in 2007 at time of rupture of appendix Negative  Consequences of Use: Personal relationships;Financial Withdrawal Symptoms: Nausea / Vomiting;Weakness Onset of Seizures: 11/2 Date of most recent seizure: 04/15/2012 (one time event) Name of Substance 1: Alcohol 1 - Age of First Use: 18 1 - Amount (size/oz): 72 - 96 oz 1 - Frequency: daily 1 - Duration: 3 months 1 - Last Use / Amount: 12/26 Name of Substance 2: benzos 2 - Age of First Use: 18 2 - Amount (size/oz): varies 2 - Frequency: TID 2 - Duration: 8 or 9 years 2 - Last Use / Amount: 12/27     Current Place of Residence:   Place of Birth:   Family Members: Marital Status:  in a 20+ year relationship with same man Children:  Sons:  Daughters: 1 61 yrs old Relationships: stable 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 Legal History: Hobbies/Interests:  Family History:   Family History  Problem Relation Age of Onset  . Hypertension Mother   . Hyperlipidemia Mother   . Heart disease Father   . Depression Father   . Parkinsonism Father     Results for orders placed during the hospital encounter of 06/09/12 (from the past 72 hour(s))  ACETAMINOPHEN LEVEL     Status: Normal   Collection Time   06/09/12  1:45 PM      Component Value Range Comment   Acetaminophen (Tylenol), Serum <15.0  10 - 30 ug/mL   CBC     Status: Normal   Collection Time   06/09/12  1:45 PM      Component Value Range Comment   WBC 9.0  4.0 - 10.5 K/uL    RBC 4.50  3.87 - 5.11 MIL/uL    Hemoglobin 14.5  12.0 - 15.0 g/dL    HCT 16.1  09.6 - 04.5 %    MCV 95.8  78.0 - 100.0 fL    MCH 32.2  26.0 - 34.0 pg    MCHC 33.6  30.0 - 36.0 g/dL    RDW 40.9  81.1 - 91.4 %    Platelets 282  150 - 400 K/uL   COMPREHENSIVE METABOLIC PANEL     Status: Abnormal   Collection Time   06/09/12  1:45 PM      Component Value Range Comment   Sodium 137  135 - 145 mEq/L     Potassium 3.8  3.5 - 5.1 mEq/L    Chloride 98  96 - 112 mEq/L    CO2 26  19 - 32 mEq/L    Glucose, Bld 117 (*) 70 - 99 mg/dL    BUN 9  6 - 23 mg/dL    Creatinine, Ser 7.82  0.50 - 1.10 mg/dL    Calcium 95.6  8.4 - 10.5 mg/dL    Total Protein 7.1  6.0 - 8.3 g/dL    Albumin 4.0  3.5 - 5.2 g/dL    AST 27  0 - 37 U/L    ALT 24  0 - 35 U/L    Alkaline Phosphatase 107  39 - 117 U/L    Total Bilirubin 0.4  0.3 - 1.2 mg/dL    GFR calc non Af Amer >90  >90 mL/min    GFR calc Af Amer >90  >90 mL/min   ETHANOL     Status: Normal   Collection Time   06/09/12  1:45 PM      Component Value Range Comment   Alcohol, Ethyl (B) <11  0 - 11 mg/dL   SALICYLATE LEVEL     Status: Abnormal   Collection Time   06/09/12  1:45 PM      Component Value Range Comment   Salicylate Lvl <2.0 (*) 2.8 - 20.0 mg/dL   URINE RAPID DRUG SCREEN (HOSP PERFORMED)     Status: Abnormal   Collection Time   06/09/12  3:04 PM      Component Value Range Comment   Opiates NONE DETECTED  NONE DETECTED    Cocaine NONE DETECTED  NONE DETECTED    Benzodiazepines NONE DETECTED  NONE DETECTED    Amphetamines NONE DETECTED  NONE DETECTED    Tetrahydrocannabinol POSITIVE (*) NONE DETECTED    Barbiturates NONE DETECTED  NONE DETECTED   POCT PREGNANCY, URINE     Status: Normal   Collection Time   06/09/12  3:18 PM      Component Value Range Comment   Preg Test, Ur NEGATIVE  NEGATIVE    Psychological Evaluations:  Assessment:   AXIS I:  MDD vs Substance induced mood disorder, benzodiazepine dependence in remission, Cannabis abuse AXIS II:  Deferred AXIS III:   Past Medical History  Diagnosis Date  . GERD (gastroesophageal reflux disease)   . Anxiety   . Depression   . Cataract 06/09/2012    Right eye  . Urinary incontinence 06/09/2012  . 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    Event occurred in 2007  AXIS IV:  economic problems, housing problems, occupational problems, problems with access to health care services and  problems with primary support group AXIS V:  41-50 serious symptoms  Treatment Plan/Recommendations:  1. Admit for crisis management and stabilization. 2. Medication management to reduce current symptoms to base line and improve the patient's overall level of functioning 3. Treat health problems as indicated. 4. Develop treatment plan to decrease risk of relapse upon discharge and the need for readmission. 5. Psycho-social education regarding relapse prevention and self care. 6. Health care follow up as needed for medical problems. 7. Restart home medications where appropriate.    Treatment Plan Summary: Daily contact with patient to assess and evaluate symptoms and progress in treatment Medication management Current Medications:  Current Facility-Administered Medications  Medication Dose Route Frequency Provider Last Rate Last Dose  . acetaminophen (TYLENOL) tablet 650 mg  650 mg Oral Q6H PRN Shuvon Rankin, NP      . alum & mag hydroxide-simeth (MAALOX/MYLANTA) 200-200-20 MG/5ML suspension 30 mL  30 mL Oral Q4H PRN Shuvon Rankin, NP      . chlordiazePOXIDE (LIBRIUM) capsule 25 mg  25 mg Oral Q6H PRN Shuvon Rankin, NP      . chlordiazePOXIDE (LIBRIUM) capsule 25 mg  25 mg Oral QID Shuvon Rankin, NP   25 mg at 06/10/12 1610   Followed by  . chlordiazePOXIDE (LIBRIUM) capsule 25 mg  25 mg Oral TID Shuvon Rankin, NP       Followed by  . chlordiazePOXIDE (LIBRIUM) capsule 25 mg  25 mg Oral BH-qamhs Shuvon Rankin, NP       Followed by  . chlordiazePOXIDE (LIBRIUM) capsule 25 mg  25 mg Oral Daily Shuvon Rankin, NP      . escitalopram (LEXAPRO) tablet 10 mg  10 mg Oral Daily Shuvon Rankin, NP   10 mg at 06/10/12 0806  . hydrOXYzine (ATARAX/VISTARIL) tablet 25 mg  25 mg Oral Q6H PRN Shuvon Rankin, NP      . hydrOXYzine (ATARAX/VISTARIL) tablet 50 mg  50 mg Oral QHS PRN Shuvon Rankin, NP   50 mg at 06/09/12 2340  . loperamide (IMODIUM) capsule 2-4 mg  2-4 mg Oral PRN Shuvon Rankin, NP      .  LORazepam (ATIVAN) tablet 1 mg  1 mg Oral Q8H PRN Shuvon Rankin, NP   1 mg at 06/09/12 2159  . magnesium hydroxide (MILK OF MAGNESIA) suspension 30 mL  30 mL Oral Daily PRN Shuvon Rankin, NP      . multivitamin with minerals tablet 1 tablet  1 tablet Oral Daily Shuvon Rankin, NP   1 tablet at 06/10/12 0806  . nicotine (NICODERM CQ - dosed in mg/24 hours) 14 mg/24hr patch           . ondansetron (ZOFRAN-ODT) disintegrating tablet 4 mg  4 mg Oral Q6H PRN Shuvon Rankin, NP      . pantoprazole (PROTONIX) EC tablet 80 mg  80 mg Oral Daily Shuvon Rankin, NP   80 mg at 06/10/12 0806  . thiamine (B-1) injection 100 mg  100 mg Intramuscular Once Shuvon Rankin, NP      . thiamine (VITAMIN B-1) tablet 100 mg  100 mg Oral Daily Shuvon Rankin, NP   100 mg at 06/10/12 9604    Observation Level/Precautions:  routine  Laboratory:    Psychotherapy:    Medications:    Consultations:    Discharge Concerns:    Estimated LOS:  Other:     I certify that  inpatient services furnished can reasonably be expected to improve the patient's condition.    Rona Ravens. Tennis Mckinnon PAC 12/28/201310:03 AM

## 2012-06-11 DIAGNOSIS — F1910 Other psychoactive substance abuse, uncomplicated: Secondary | ICD-10-CM

## 2012-06-11 DIAGNOSIS — F1010 Alcohol abuse, uncomplicated: Secondary | ICD-10-CM

## 2012-06-11 MED ORDER — NICOTINE 21 MG/24HR TD PT24
21.0000 mg | MEDICATED_PATCH | Freq: Every day | TRANSDERMAL | Status: DC
  Administered 2012-06-11 – 2012-06-14 (×4): 21 mg via TRANSDERMAL
  Filled 2012-06-11 (×5): qty 1

## 2012-06-11 NOTE — Progress Notes (Signed)
Patient did attend the evening speaker AA meeting.  
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
## 2012-06-11 NOTE — Progress Notes (Signed)
Met with pt 1:1 who reports having a slightly better day. She has reached out to family via phone but is not ready for them to visit. Reports success with trazadone and was able to get 6 hours of quality sleep. States her goal today was to minimize crying. Encouraged pt that tears are an appropriate release/coping skill. Given support. No signs of withdrawal. Affect remains anxious with congruent mood. Denies Si/HI/AVH and remains safe. Karen Casey

## 2012-06-11 NOTE — Progress Notes (Signed)
GOALS GROUP This group is about being able to set a goal or be able to state what you are working on or grateful for. An Attitude of Gratitude.  Today Pt attended, participated in group interaction and was spontaneous with thoughts and suggestions for the rest of the group.  

## 2012-06-11 NOTE — Progress Notes (Signed)
D: Patient denies SI/HI/AVH. Patient rates hopelessness as 1 and depression as 2.  Patient affect and mood is anxious and depressed.  Pt states that he slept fair and his appetite is improving.  Pt states that her energy level is normal and her ability to pay attention is improving.  Pt states that at discharge she plans "to continue with things that I had learn from being here, cataract surgery, get a job, be happy" to help her remain sober.  Patient did attend group. Patient visible on the milieu. No distress noted. A: Support and encouragement offered. Scheduled medications given to pt. Q 15 min checks continued for patient safety. R: Patient receptive. Patient remains safe on the unit.

## 2012-06-11 NOTE — Progress Notes (Signed)
Psychoeducational Group Note  Date:  06/11/2012 Time:  1015  Group Topic/Focus:  Making Healthy Choices:   The focus of this group is to help patients identify negative/unhealthy choices they were using prior to admission and identify positive/healthier coping strategies to replace them upon discharge.  Participation Level:  Active  Participation Quality:  Appropriate  Affect:  Appropriate  Cognitive:  Alert  Insight:  Developing/Improving  Engagement in Group:  Developing/Improving  Additional Comments:    Taji Barretto A 06/11/2012   

## 2012-06-11 NOTE — Clinical Social Work Psychosocial (Signed)
[facility] Group Notes:  (Clinical Social Work)  06/11/2012  10:00-11:00AM  Summary of Progress/Problems:   The main focus of today's process group was for the patient to define "support" and describe what healthy supports are, then to identify the patient's current support system and decide on other supports that can be put in place to prevent future hospitalizations.   An emphasis was placed on using therapist, doctor and problem-specific support groups to expand supports.  The patient expressed that she has learned that she interrupts people a lot, and that she is working on that.  She did not address the topic at hand.  Type of Therapy:  Process Group  Participation Level:  Active  Participation Quality:  Appropriate and Attentive  Affect:  Flat  Cognitive:  Oriented  Insight:  Off Topic  Engagement in Therapy:  Limited  Modes of Intervention:  Clarification, Education, Limit-setting, Problem-solving, Socialization, Support and Processing, Exploration, Discussion, Role-Play   Ambrose Mantle, LCSW 06/11/2012, 12:34 PM

## 2012-06-12 ENCOUNTER — Encounter (HOSPITAL_COMMUNITY): Payer: Self-pay | Admitting: Psychiatry

## 2012-06-12 DIAGNOSIS — F419 Anxiety disorder, unspecified: Secondary | ICD-10-CM | POA: Diagnosis present

## 2012-06-12 NOTE — Progress Notes (Signed)
[facility] LCSW Aftercare Discharge Planning Group Note  06/12/2012 8:05 AM  Participation Quality:  Patient did not attend  :  Karen Casey 06/12/2012, 8:05 AM
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
## 2012-06-12 NOTE — Progress Notes (Signed)
Nutrition Brief Note  Patient identified on the Malnutrition Screening Tool (MST) Report  Body mass index is 24.82 kg/(m^2). Pt meets criteria for wnl based on current BMI.   Current diet order is Regular. Labs and medications reviewed.   Pt unsure about wt loss in setting of alcohol and polysubstance abuse.  Per chart review, wt recently stable at 143-149 lbs.  No nutrition interventions warranted at this time. If nutrition issues arise, please consult RD.   Loyce Dys, MS RD LDN Clinical Inpatient Dietitian Pager: [phone] Weekend/After hours pager: [phone]

## 2012-06-12 NOTE — Tx Team (Signed)
Interdisciplinary Treatment Plan Update (Adult)  Date:  06/12/2012  Time Reviewed:  8:04 AM   Progress in Treatment: Attending groups: Yes Participating in groups:  Yes Taking medication as prescribed: Yes Tolerating medication:  Yes Family/Significant othe contact made:  CSW to assess for appropriate contacts Patient understands diagnosis:  Yes Discussing patient identified problems/goals with staff:  Yes Medical problems stabilized or resolved:  Yes Denies suicidal/homicidal ideation: Yes Issues/concerns per patient self-inventory:  None identified Other: N/A  New problem(s) identified: None Identified  Reason for Continuation of Hospitalization: Anxiety Depression Medication stabilization  Interventions implemented related to continuation of hospitalization: mood stabilization, medication monitoring and adjustment, group therapy and psycho education, suicide risk assessment, collateral contact, aftercare planning, ongoing physician assessments and safety checks q 15 mins  Additional comments: N/A  Estimated length of stay: 3-5 days  Discharge Plan: CSW is assessing for appropriate referrals.    New goal(s): N/A  Review of initial/current patient goals per problem list:    1.  Goal(s): Address substance use by completing detox protocol  Met:  No  Target date: 06/14/12  As evidenced by: librium detox completion on 06/14/12  2.  Goal (s): Reduce depressive symptoms from a 10 to a 3  Met:  No  Target date: 3-5 days  As evidenced by: Pt rates at a 10  3.  Goal (s): Reduce anxiety symptoms from a 10 to a 3  Met:  No  Target date:  3-5 days  As evidenced by: Pt rates at a  10  4.  Goal(s):  Met:  No  Target date:   As evidenced by:   Attendees: Patient:     Family:     Physician: Geoffery Lyons, MD 06/12/2012 8:04 AM   Nursing: Roswell Miners, RN 06/12/2012 8:04 AM   Clinical Social Worker:  Reyes Ivan, LCSWA 06/12/2012  8:04 AM   Other: Verna Czech, LCSW 06/12/2012  8:04 AM   Other:  [name] Locus, RN   Other:  Serena Colonel, RN   Other:     Other:      Scribe for Treatment Team:   Keslie Gritz 06/12/2012 8:04 AM

## 2012-06-12 NOTE — Progress Notes (Signed)
[facility] LCSW Group Therapy  06/12/2012 4:12 PM  Type of Therapy:  Group Therapy  Participation Level:  Did Not Attend  Karen Casey 06/12/2012, 4:12 PM

## 2012-06-12 NOTE — Progress Notes (Signed)
Psychoeducational Group Note  Date:  06/12/2012 Time:  1100  Group Topic/Focus:  Self Care:   The focus of this group is to help patients understand the importance of self-care in order to improve or restore emotional, physical, spiritual, interpersonal, and financial health.  Participation Level:  Did Not Attend  Participation Quality:    Affect:    Cognitive:    Insight:    Engagement in Group:    Additional Comments:  Pt was allowed to stay in the room by the MD.  Andree Moro, Genia Plants 06/12/2012, 1:50 PM

## 2012-06-12 NOTE — Progress Notes (Signed)
[facility] MD Progress Note  06/12/2012 4:04 PM Karen Casey  MRN:  [id number] Subjective:  Karen Casey has had a very hard time reacting to an event that happened with a female patient. She was wanting to leave, but when the other patient was discharged as plan, she calmed down and start re grouping, re focusing on what brought her here.  She is experiencing a lot of anxiety, worry. She is concerned as she had a seizure coming off Xanax on her own. She was given Ativan what she overtook. She has been drinking. She wants to get her life back together, be off alcohol and benzodiazepines.  Diagnosis:  Benzodiazepine Dependence, withdrawal, Alcohol Abuse R/O Dependence, Anxiety Disorder NOS, R/O ADHD  ADL's:  Intact  Sleep: Fair  Appetite:  Fair  Suicidal Ideation:  Plan:  Denies Intent:  Denies Means:  Denies Homicidal Ideation:  Plan:  Denies Intent:  Denies Means:  Denies AEB (as evidenced by):  Psychiatric Specialty Exam: Review of Systems  Constitutional: Negative.   HENT: Negative.   Eyes: Negative.   Respiratory: Negative.   Cardiovascular: Negative.   Gastrointestinal: Negative.   Genitourinary: Negative.   Musculoskeletal: Negative.   Skin: Negative.   Neurological: Negative.   Endo/Heme/Allergies: Negative.   Psychiatric/Behavioral: Positive for substance abuse. The patient is nervous/anxious.     Blood pressure 124/79, pulse 101, temperature 97 F (36.1 C), temperature source Oral, resp. rate 16, height 5' 4.96" (1.65 m), weight 67.586 kg (149 lb).Body mass index is 24.82 kg/(m^2).  General Appearance: Fairly Groomed  Patent attorney::  Fair  Speech:  Clear and Coherent and rapid  Volume:  Normal  Mood:  Anxious and Irritable  Affect:  Labile  Thought Process:  Coherent and Goal Directed  Orientation:  Full (Time, Place, and Person)  Thought Content:  Rumination  Suicidal Thoughts:  No  Homicidal Thoughts:  No  Memory:  Immediate;   Fair Recent;   Fair Remote;   Fair    Judgement:  Fair  Insight:  superficial  Psychomotor Activity:  Restlessness  Concentration:  Fair  Recall:  Fair  Akathisia:  No  Handed:  Right  AIMS (if indicated):     Assets:  Desire for Improvement  Sleep:  Number of Hours: 3.25    Current Medications: Current Facility-Administered Medications  Medication Dose Route Frequency Provider Last Rate Last Dose  . acetaminophen (TYLENOL) tablet 650 mg  650 mg Oral Q6H PRN Shuvon Rankin, NP   650 mg at 06/10/12 2242  . alum & mag hydroxide-simeth (MAALOX/MYLANTA) 200-200-20 MG/5ML suspension 30 mL  30 mL Oral Q4H PRN Shuvon Rankin, NP   30 mL at 06/11/12 2207  . chlordiazePOXIDE (LIBRIUM) capsule 25 mg  25 mg Oral Q6H PRN Shuvon Rankin, NP   25 mg at 06/10/12 1832  . chlordiazePOXIDE (LIBRIUM) capsule 25 mg  25 mg Oral BH-qamhs Shuvon Rankin, NP   25 mg at 06/12/12 1191   Followed by  . chlordiazePOXIDE (LIBRIUM) capsule 25 mg  25 mg Oral Daily Shuvon Rankin, NP      . escitalopram (LEXAPRO) tablet 10 mg  10 mg Oral Daily Shuvon Rankin, NP   10 mg at 06/12/12 0837  . hydrOXYzine (ATARAX/VISTARIL) tablet 25 mg  25 mg Oral Q6H PRN Shuvon Rankin, NP   25 mg at 06/12/12 1018  . hydrOXYzine (ATARAX/VISTARIL) tablet 50 mg  50 mg Oral QHS PRN Shuvon Rankin, NP   50 mg at 06/09/12 2340  . loperamide (IMODIUM) capsule 2-4  mg  2-4 mg Oral PRN Shuvon Rankin, NP      . magnesium hydroxide (MILK OF MAGNESIA) suspension 30 mL  30 mL Oral Daily PRN Shuvon Rankin, NP   30 mL at 06/11/12 2207  . multivitamin with minerals tablet 1 tablet  1 tablet Oral Daily Shuvon Rankin, NP   1 tablet at 06/12/12 0837  . nicotine (NICODERM CQ - dosed in mg/24 hours) patch 21 mg  21 mg Transdermal Daily Rachael Fee, MD   21 mg at 06/12/12 8413  . ondansetron (ZOFRAN-ODT) disintegrating tablet 4 mg  4 mg Oral Q6H PRN Shuvon Rankin, NP      . pantoprazole (PROTONIX) EC tablet 80 mg  80 mg Oral Daily Rachael Fee, MD   80 mg at 06/12/12 2440  . thiamine (B-1) injection  100 mg  100 mg Intramuscular Once Shuvon Rankin, NP      . thiamine (VITAMIN B-1) tablet 100 mg  100 mg Oral Daily Shuvon Rankin, NP   100 mg at 06/12/12 0837  . traZODone (DESYREL) tablet 100 mg  100 mg Oral QHS Mickeal Skinner, MD   100 mg at 06/11/12 2207    Lab Results: No results found for this or any previous visit (from the past 48 hour(s)).  Physical Findings: AIMS: Facial and Oral Movements Muscles of Facial Expression: None, normal Lips and Perioral Area: None, normal Jaw: None, normal Tongue: None, normal,Extremity Movements Upper (arms, wrists, hands, fingers): None, normal Lower (legs, knees, ankles, toes): None, normal, Trunk Movements Neck, shoulders, hips: None, normal, Overall Severity Severity of abnormal movements (highest score from questions above): None, normal Incapacitation due to abnormal movements: None, normal Patient's awareness of abnormal movements (rate only patient's report): No Awareness, Dental Status Current problems with teeth and/or dentures?: No Does patient usually wear dentures?: No  CIWA:  CIWA-Ar Total: 2  COWS:     Treatment Plan Summary: Daily contact with patient to assess and evaluate symptoms and progress in treatment Medication management  Plan: Pursue and complete Detox. Supportive approach/coping skills/relapse prevention           Librium detox           CBT to help with the anxiety/focus  Medical Decision Making Problem Points:  Established problem, worsening (2), New problem, with no additional work-up planned (3) and Review of psycho-social stressors (1) Data Points:  Review of medication regiment & side effects (2)  I certify that inpatient services furnished can reasonably be expected to improve the patient's condition.   Yeimy Brabant A 06/12/2012, 4:04 PM

## 2012-06-13 MED ORDER — GUAIFENESIN-DM 100-10 MG/5ML PO SYRP
5.0000 mL | ORAL_SOLUTION | ORAL | Status: DC | PRN
  Administered 2012-06-13 (×3): 5 mL via ORAL
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
## 2012-06-13 NOTE — Progress Notes (Signed)
Psychoeducational Group Note  Date:  06/13/2012 Time:  2000  Group Topic/Focus:  AA  Participation Level:  Active  Participation Quality:  Appropriate  Affect:  Appropriate  Cognitive:  Appropriate  Insight:  Engaged  Engagement in Group:  Engaged  Additional Comments:    Humberto Seals Monique 06/13/2012, 10:12 PM

## 2012-06-13 NOTE — Clinical Social Work Note (Signed)
[facility] LCSW Group Therapy  06/13/2012  1:15 PM   Type of Therapy:  Group Therapy  Participation Level:  Active  Participation Quality:  Appropriate and Attentive  Affect:  Appropriate  Cognitive:  Alert and Appropriate  Insight:  Developing/Improving  Engagement in Therapy:  Developing/Improving  Modes of Intervention:  Clarification, Discussion, Education, Exploration, Problem-solving, Rapport Building, Socialization and Support  Summary of Progress/Problems: The topic for group therapy was feelings about diagnosis.  Pt actively participated in group discussion on their past and current diagnosis and how they feel towards this.  Pt also identified how society and family members judge them, based on their diagnosis as well as stereotypes and stigmas.  Pt discussed her family telling her she had a problem with drinking and using xanax but didn't want to believe them.  Pt states that she has accepted she had a problem and is ready for help.    Karen Casey, [place] 06/13/2012 2:56 PM

## 2012-06-13 NOTE — Progress Notes (Signed)
Patient ID: Karen Casey, female   DOB: 01-Oct-1955, 56 y.o.   MRN: [id number]  D: Pt approached the writer smiling with her hands waving in the air.  "I'm the happy Karen Casey today". Writer asked pt, what made her happy today. Pt stated "All that stuff from the other night is up there", waving to the sky. Stated she was told by her doctor that she could be discharged tomorrow. "but if I change my mind then I could stay."  A:  Support and encouragement was offered. 15 min checks continued for safety.  R: Pt remains safe.

## 2012-06-13 NOTE — Progress Notes (Signed)
Psychoeducational Group Note  Date:  06/13/2012 Time:  1100  Group Topic/Focus:  Recovery Goals:   The focus of this group is to identify appropriate goals for recovery and establish a plan to achieve them.  Participation Level:  Active  Participation Quality:  Appropriate  Affect:  Appropriate  Cognitive:  Alert and Appropriate  Insight:  Engaged  Engagement in Group:  Engaged  Additional Comments:  Pt. Was attentive, sharing, and participated in group activity.   Ruta Hinds [facility] 06/13/2012, 2:46 PM

## 2012-06-13 NOTE — Progress Notes (Signed)
[facility] Adult Case Management Discharge Plan :  Will you be returning to the same living situation after discharge: Yes,  returning home At discharge, do you have transportation home?:Yes,  access to transportation Do you have the ability to pay for your medications:Yes,  access to meds  Release of information consent forms completed and in the chart;  Patient's signature needed at discharge.  Patient to Follow up at: Follow-up Information    Follow up with [facility] - [facility]. On 06/15/2012. (Meet with Charmian Muff at 3:00 pm on this date, start [facility] on Friday (1-4 pm))    Contact information:   [address] [place], [place] [address] [phone]         Patient denies SI/HI:   Yes,  denies SI/HI    Safety Planning and Suicide Prevention discussed:  Yes,  discussed with pt and family  No recommendations from CSW.  No further needs voiced by pt.  Pt stable to discharge.  Pt is scheduled to d/c tomorrow - CSW will not be present due to the holiday but d/c plans are all in place.    Carmina Miller 06/13/2012, 3:28 PM

## 2012-06-13 NOTE — Clinical Social Work Note (Signed)
[facility] LCSW Aftercare Discharge Planning Group Note  06/13/2012 8:45 AM  Participation Quality:  Appropriate and Attentive  Affect:  Appropriate  Cognitive:  Alert and Appropriate  Insight:  Developing/Improving  Engagement in Group:  Developing/Improving  Modes of Intervention:  Clarification, Discussion, Education, Exploration, Orientation, Problem-solving, Rapport Building, Socialization and Support  Summary of Progress/Problems: Pt attended discharge planning group and actively participated in group.  CSW provided pt with today's workbook.  Pt presents with calm mood and affect.  Pt rates depression at a 4 and anxiety at a 6 today.  Pt denies SI/HI.  Pt states that she lives in [place].  Pt states that she sees Charmian Muff with  [facility] outpatient for individual therapy.  CSW will secure pt's follow up.  No further needs voiced by pt at this time.  Safety planning and suicide prevention discussed.  Pt participated in discussion and acknowledged an understanding of the information provided.       Reyes Ivan, LCSWA 06/13/2012 10:57 AM

## 2012-06-13 NOTE — Progress Notes (Signed)
[facility] MD Progress Note  06/13/2012 4:11 PM Karen Casey  MRN:  [id number] Subjective:  Endoreses that she has been able to move on past what happened yesterday. She has refocused on the now and  herself. She is working on ways of dealing with the anxiety without using alcohol or benzodiazepines to cope. She is committed to pursue outpatient treatment.  Diagnosis:  Alcohol, benzodiazepine abuse, Anxiety Disorder NOS  ADL's:  Intact  Sleep: Fair  Appetite:  Fair  Suicidal Ideation:  Plan:  Denies Intent:  denies Means:  denies Homicidal Ideation:  Plan:  Denies Intent:  Denies Means:  Denies AEB (as evidenced by):  Psychiatric Specialty Exam: Review of Systems  Constitutional: Negative.   HENT: Negative.   Eyes: Negative.   Respiratory: Negative.   Cardiovascular: Negative.   Gastrointestinal: Negative.   Genitourinary: Negative.   Musculoskeletal: Negative.   Skin: Negative.   Neurological: Negative.   Endo/Heme/Allergies: Negative.   Psychiatric/Behavioral: Positive for depression and substance abuse. The patient is nervous/anxious.     Blood pressure 117/80, pulse 109, temperature 98.2 F (36.8 C), temperature source Oral, resp. rate 16, height 5' 4.96" (1.65 m), weight 67.586 kg (149 lb).Body mass index is 24.82 kg/(m^2).  General Appearance: Fairly Groomed  Patent attorney::  Fair  Speech:  Clear and Coherent  Volume:  Normal  Mood:  Anxious  Affect:  Appropriate  Thought Process:  Circumstantial and Logical  Orientation:  Full (Time, Place, and Person)  Thought Content:  WDL  Suicidal Thoughts:  No  Homicidal Thoughts:  No  Memory:  Immediate;   Fair Recent;   Fair Remote;   Fair  Judgement:  Fair  Insight:  superficial  Psychomotor Activity:  Restlessness  Concentration:  Fair  Recall:  Fair  Akathisia:  No  Handed:  Right  AIMS (if indicated):     Assets:  Desire for Improvement Housing Social Support  Sleep:  Number of Hours: 3.25    Current  Medications: Current Facility-Administered Medications  Medication Dose Route Frequency Provider Last Rate Last Dose  . acetaminophen (TYLENOL) tablet 650 mg  650 mg Oral Q6H PRN Shuvon Rankin, NP   650 mg at 06/12/12 2206  . alum & mag hydroxide-simeth (MAALOX/MYLANTA) 200-200-20 MG/5ML suspension 30 mL  30 mL Oral Q4H PRN Shuvon Rankin, NP   30 mL at 06/11/12 2207  . escitalopram (LEXAPRO) tablet 10 mg  10 mg Oral Daily Shuvon Rankin, NP   10 mg at 06/13/12 0817  . guaiFENesin-dextromethorphan (ROBITUSSIN DM) 100-10 MG/5ML syrup 5 mL  5 mL Oral Q4H PRN Sanjuana Kava, NP   5 mL at 06/13/12 1511  . hydrOXYzine (ATARAX/VISTARIL) tablet 50 mg  50 mg Oral QHS PRN Shuvon Rankin, NP   50 mg at 06/09/12 2340  . magnesium hydroxide (MILK OF MAGNESIA) suspension 30 mL  30 mL Oral Daily PRN Shuvon Rankin, NP   30 mL at 06/11/12 2207  . multivitamin with minerals tablet 1 tablet  1 tablet Oral Daily Shuvon Rankin, NP   1 tablet at 06/13/12 0817  . nicotine (NICODERM CQ - dosed in mg/24 hours) patch 21 mg  21 mg Transdermal Daily Rachael Fee, MD   21 mg at 06/13/12 0818  . pantoprazole (PROTONIX) EC tablet 80 mg  80 mg Oral Daily Rachael Fee, MD   80 mg at 06/13/12 0817  . thiamine (B-1) injection 100 mg  100 mg Intramuscular Once Shuvon Rankin, NP      .  thiamine (VITAMIN B-1) tablet 100 mg  100 mg Oral Daily Shuvon Rankin, NP   100 mg at 06/13/12 0817  . traZODone (DESYREL) tablet 100 mg  100 mg Oral QHS Mickeal Skinner, MD   100 mg at 06/12/12 2206    Lab Results: No results found for this or any previous visit (from the past 48 hour(s)).  Physical Findings: AIMS: Facial and Oral Movements Muscles of Facial Expression: None, normal Lips and Perioral Area: None, normal Jaw: None, normal Tongue: None, normal,Extremity Movements Upper (arms, wrists, hands, fingers): None, normal Lower (legs, knees, ankles, toes): None, normal, Trunk Movements Neck, shoulders, hips: None, normal, Overall  Severity Severity of abnormal movements (highest score from questions above): None, normal Incapacitation due to abnormal movements: None, normal Patient's awareness of abnormal movements (rate only patient's report): No Awareness, Dental Status Current problems with teeth and/or dentures?: No Does patient usually wear dentures?: No  CIWA:  CIWA-Ar Total: 2  COWS:     Treatment Plan Summary: Daily contact with patient to assess and evaluate symptoms and progress in treatment Medication management  Plan: Supportive approach/coping skills/relapse prevention Medical Decision Making Problem Points:  Established problem, stable/improving (1), Review of last therapy session (1) and Review of psycho-social stressors (1) Data Points:  Review of medication regiment & side effects (2)  I certify that inpatient services furnished can reasonably be expected to improve the patient's condition.   Marie Chow A 06/13/2012, 4:11 PM

## 2012-06-13 NOTE — Progress Notes (Signed)
[facility] INPATIENT:  Family/Significant Other Suicide Prevention Education  Suicide Prevention Education:  Education Completed; Karen Casey - sister [phone]),  (name of family member/significant other) has been identified by the patient as the family member/significant other with whom the patient will be residing, and identified as the person(s) who will aid the patient in the event of a mental health crisis (suicidal ideations/suicide attempt).  With written consent from the patient, the family member/significant other has been provided the following suicide prevention education, prior to the and/or following the discharge of the patient.  The suicide prevention education provided includes the following:  Suicide risk factors  Suicide prevention and interventions  National Suicide Hotline telephone number  [facility] assessment telephone number  [facility] Emergency Assistance 911  [facility] and/or Residential Mobile Crisis Unit telephone number  Request made of family/significant other to:  Remove weapons (e.g., guns, rifles, knives), all items previously/currently identified as safety concern.    Remove drugs/medications (over-the-counter, prescriptions, illicit drugs), all items previously/currently identified as a safety concern.  The family member/significant other verbalizes understanding of the suicide prevention education information provided.  The family member/significant other agrees to remove the items of safety concern listed above.  Karen Casey 06/13/2012, 2:16 PM

## 2012-06-13 NOTE — Progress Notes (Signed)
Patient ID: Karen Casey, female   DOB: [birth year], 56 y.o.   MRN: [id number] D: Pt is awake and active on the unit this AM. Pt denies SI/HI and A/V hallucinations. Pt is participating in the milieu and is cooperative with staff. Pt rates their depression at 2 and hopelessness at 10. Pt mood and affect are anxious. Pt Writes that she plans to attend AA, change her lifestyle, have her cataract surgery and return to work. She states that she did receive a good amount of sleep last night for the first time since arriving here. Pt c/o a productive cough with a small amount of yellow phlegm. Writer informed NP and prn medications were administered.   A: Writer utilized therapeutic communication, encouraged pt to discuss feelings with staff and administered medication per MD orders. Writer also encouraged pt to attend groups. Pt expressed gratitude for being at [facility] and "growing from it."  R: Pt is attending groups and tolerating medications well. Writer will continue to monitor. 15 minute checks are ongoing for safety.

## 2012-06-14 MED ORDER — HYDROXYZINE HCL 50 MG PO TABS: 50.0000 mg | ORAL_TABLET | Freq: Every evening | ORAL | Status: DC | PRN

## 2012-06-14 MED ORDER — OMEPRAZOLE 20 MG PO CPDR: 40.0000 mg | DELAYED_RELEASE_CAPSULE | Freq: Every day | ORAL | Status: DC

## 2012-06-14 MED ORDER — ESCITALOPRAM OXALATE 10 MG PO TABS: 10.0000 mg | ORAL_TABLET | Freq: Every day | ORAL | Status: DC

## 2012-06-14 MED ORDER — TRAZODONE HCL 100 MG PO TABS: 100.0000 mg | ORAL_TABLET | Freq: Every day | ORAL | Status: DC

## 2012-06-14 NOTE — Progress Notes (Signed)
D: Patient's mood was appropriate to the circumstance. Patient verbalized that she was happy about being discharged today.  A: Support and encouragement provided. Administered scheduled medications per MD orders. Discharge instructions/prescriptions/medication samples given to patient. Returned belongings to patient.  R: Patient receptive. Patient verbalized understanding of discharge instructions and prescriptions.  Denies SI/HI. Patient d/c without incident.

## 2012-06-14 NOTE — [facility] Suicide Risk Assessment (Signed)
Suicide Risk Assessment  Discharge Assessment     Demographic Factors:  Caucasian  Mental Status Per Nursing Assessment::   On Admission:  NA  Current Mental Status by Physician: In full contact with reality. There are no suicidal ideas, plans or intent. Her mood is euthymic, her affect is appropriate. She is committed to her recovery.    Loss Factors: NA  Historical Factors: NA  Risk Reduction Factors:   Sense of responsibility to family and Positive social support  Continued Clinical Symptoms:  Severe Anxiety and/or Agitation Depression:   Comorbid alcohol abuse/dependence Alcohol/Substance Abuse/Dependencies  Cognitive Features That Contribute To Risk:  Closed-mindedness Thought constriction (tunnel vision)    Suicide Risk:  Minimal: No identifiable suicidal ideation.  Patients presenting with no risk factors but with morbid ruminations; may be classified as minimal risk based on the severity of the depressive symptoms  Discharge Diagnoses:   AXIS I:  Alcohol, Benzodiazepine Dependence, withdrawal, anxiety disorder NOS Depressive Disorder NOS AXIS II:  Deferred AXIS III:   Past Medical History  Diagnosis Date  . GERD (gastroesophageal reflux disease)   . Anxiety   . Depression   . Cataract 06/09/2012    Right eye  . Urinary incontinence 06/09/2012  . 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    Event occurred in 2007   AXIS IV:  as created by her addiction AXIS V:  61-70 mild symptoms  Plan Of Care/Follow-up recommendations:  Activity:  As tolerated Diet:  regular  Is patient on multiple antipsychotic therapies at discharge:  No   Has Patient had three or more failed trials of antipsychotic monotherapy by history:  No  Recommended Plan for Multiple Antipsychotic Therapies: N/A   Luane Rochon A 06/14/2012, 11:19 AM

## 2012-06-14 NOTE — Discharge Summary (Signed)
Physician Discharge Summary Note  Patient:  Karen Casey is an 57 y.o., female MRN:  [id number] DOB:  02/21/1956 Patient phone:  [phone] (home)  Patient address:   [address] [place] [address],   Date of Admission:  06/09/2012  Date of Discharge: 06/14/12  Reason for Admission:  Alcohol and benzodiazepine detoxification.  Discharge Diagnoses: Principal Problem:  *Polysubstance abuse Active Problems:  Benzodiazepine withdrawal  Alcohol addiction  Anxiety disorder  Review of Systems  Constitutional: Negative.   HENT: Negative.   Eyes: Negative.   Respiratory: Negative.   Cardiovascular: Negative.   Gastrointestinal: Negative.   Genitourinary: Negative.   Musculoskeletal: Negative.   Skin: Negative.   Neurological: Negative.   Endo/Heme/Allergies: Negative.   Psychiatric/Behavioral: Positive for depression (Stabilized with medication prior to discharge.) and substance abuse (Hx of). Negative for suicidal ideas, hallucinations and memory loss. The patient is nervous/anxious (Stabilized with medication prior to discharge) and has insomnia (Stabilized with sleep aid prior to discharge.).    Axis Diagnosis:   AXIS I:  Polysubstance abuse, anxiety disorder, Alcohol addiction. AXIS II:  Deferred AXIS III:   Past Medical History  Diagnosis Date  . GERD (gastroesophageal reflux disease)   . Anxiety   . Depression   . Cataract 06/09/2012    Right eye  . Urinary incontinence 06/09/2012  . 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    Event occurred in 2007   AXIS IV:  Substance absue issues. AXIS V:  63  Level of Care:  OP    Hospital Course:  This is a first admission for Karen Casey who presented to [facility] as a walk-in for detox from alcohol and benzodiazepines. She was sent to [facility] for medical clearance. She reports drinking 2-10 beers a day for 3 months. Karen Casey was taking 6 xanax 2 mg a day that she was getting from her psychiatrist Dr. Evelene Croon. Her last xanax was  in October and resulted in her having a withdrawal seizure. Her last alcohol was 2 days ago, but she had stopped 5 days from Dec 12 -Dec 17th.   Upon admission into this hospital, and after admission assessment/evaluation, Karen Casey was started on Librium protocol for her detoxification treatment. She was also enrolled in group counseling sessions and activities to learn coping skills that should help her cope better and maintain a much longer sobriety. She was also enrolled in and attended AA/NA meetings being offered and held on this unit. She has some previous and or identifiable medical conditions that required treatment and or monitoring. She received medication management for all those health issues as well. She was monitored closely for any potential problems that may arise as a reasult of and or during detoxification treatment. Patient tolerated her treatment regimen and detoxification treatment without any significant adverse effects and or reactions presented.  Patient attended treatment team meeting this am and met with her treatment team. Her symptoms, substance abuse issues, response to treatment and discharge plans discussed. Patient endorsed that she is doing well and stable for discharge to pursue the next phase of her substance abuse treatment. And during discharge meeting, It was agreed upon between patient and the team that she will be discharged to her home that she shares with her family.  However, she will continue psychiatric care on outpatient basis to maintain sobriety. She will follow-up care at the [facility] outpatient intensive out patient program (CDIOP) on 06/15/12 @ 3:00 pm. She will be meeting with Charmian Muff.   Upon discharge, patient  adamantly denies suicidal, homicidal ideations, auditory, visual hallucinations, delusional thinking and or withdrawal symptoms. Patient left [facility] with all personal belongings in no apparent distress. She received from [facility] a 4 days worth supply samples  of her discharge medications. Transportation per family.   Consults:  None  Significant Diagnostic Studies:  labs: CBC with diff, CMP, UDS, Toxicology tests.  Discharge Vitals:   Blood pressure 126/87, pulse 93, temperature 98.4 F (36.9 C), temperature source Oral, resp. rate 20, height 5' 4.96" (1.65 m), weight 67.586 kg (149 lb). Body mass index is 24.82 kg/(m^2). Lab Results:   No results found for this or any previous visit (from the past 72 hour(s)).  Physical Findings: AIMS: Facial and Oral Movements Muscles of Facial Expression: None, normal Lips and Perioral Area: None, normal Jaw: None, normal Tongue: None, normal,Extremity Movements Upper (arms, wrists, hands, fingers): None, normal Lower (legs, knees, ankles, toes): None, normal, Trunk Movements Neck, shoulders, hips: None, normal, Overall Severity Severity of abnormal movements (highest score from questions above): None, normal Incapacitation due to abnormal movements: None, normal Patient's awareness of abnormal movements (rate only patient's report): No Awareness, Dental Status Current problems with teeth and/or dentures?: No Does patient usually wear dentures?: No  CIWA:  CIWA-Ar Total: 2  COWS:     Psychiatric Specialty Exam: See Psychiatric Specialty Exam and Suicide Risk Assessment completed by Attending Physician prior to discharge.  Discharge destination:  Home  Is patient on multiple antipsychotic therapies at discharge:  No   Has Patient had three or more failed trials of antipsychotic monotherapy by history:  No  Recommended Plan for Multiple Antipsychotic Therapies: NA     Medication List     As of 06/14/2012 12:23 PM    STOP taking these medications         LORazepam 2 MG tablet   Commonly known as: ATIVAN      TAKE these medications      Indication    escitalopram 10 MG tablet   Commonly known as: LEXAPRO   Take 1 tablet (10 mg total) by mouth daily. For depression    Indication:  Depression      hydrOXYzine 50 MG tablet   Commonly known as: ATARAX/VISTARIL   Take 1 tablet (50 mg total) by mouth at bedtime as needed (sleep). For anxiety/sleep    Indication: insomnia      omeprazole 20 MG capsule   Commonly known as: PRILOSEC   Take 2 capsules (40 mg total) by mouth daily. For acid reflux       traZODone 100 MG tablet   Commonly known as: DESYREL   Take 1 tablet (100 mg total) by mouth at bedtime. For sleep         Follow-up Information    Follow up with [facility] - CDIOP. On 06/15/2012. (Meet with Charmian Muff at 3:00 pm on this date, start CDIOP on Friday (1-4 pm))    Contact information:   [address] [place], [place] [address] [phone]         Follow-up recommendations:  Activity:  as tolerated Other:  Keep all scheduled follow-up appointments as recommended.    Comments: Take all your medications as prescribed by your mental healthcare provider. Report any adverse effects and or reactions from your medicines to your outpatient provider promptly. Patient is instructed and cautioned to not engage in alcohol and or illegal drug use while on prescription medicines. In the event of worsening symptoms, patient is instructed to  call the crisis hotline, 911 and or go to the nearest ED for appropriate evaluation and treatment of symptoms. Follow-up with your primary care provider for your other medical issues, concerns and or health care needs.     Total Discharge Time:  Greater than 30 minutes.  SignedArmandina Stammer I 06/14/2012, 12:23 PM

## 2012-06-15 ENCOUNTER — Encounter: Payer: Self-pay | Admitting: Family Medicine

## 2012-06-15 ENCOUNTER — Ambulatory Visit (INDEPENDENT_AMBULATORY_CARE_PROVIDER_SITE_OTHER): Payer: No Typology Code available for payment source | Admitting: Family Medicine

## 2012-06-15 VITALS — BP 157/88 | HR 103 | Temp 98.4°F | Ht 67.0 in | Wt 148.0 lb

## 2012-06-15 DIAGNOSIS — J069 Acute upper respiratory infection, unspecified: Secondary | ICD-10-CM

## 2012-06-15 NOTE — Progress Notes (Signed)
Patient Discharge Instructions:  Next Level Care Provider Has Access to the EMR, 06/14/12 Records provided to [facility] outpatient clinic via [facility] Access.  Jerelene Redden, 06/15/2012, 2:04 PM

## 2012-06-15 NOTE — Patient Instructions (Addendum)

## 2012-06-16 ENCOUNTER — Ambulatory Visit: Payer: Self-pay | Admitting: Family Medicine

## 2012-06-18 DIAGNOSIS — J069 Acute upper respiratory infection, unspecified: Secondary | ICD-10-CM | POA: Insufficient documentation

## 2012-06-18 NOTE — Assessment & Plan Note (Signed)
ASSESSMENT:  viral upper respiratory illness  PLAN: Symptomatic therapy suggested: push fluids, rest, encouraged very strongly to quit smoking and return office visit prn if symptoms persist or worsen. Lack of antibiotic effectiveness discussed with her. Call or return to clinic prn if these symptoms worsen or fail to improve as anticipated.

## 2012-06-18 NOTE — Progress Notes (Signed)
Patient ID: Karen Casey, female   DOB: 06-28-1955, 57 y.o.   MRN: [id number] SUBJECTIVE:  Karen Casey is a 57 y.o. female who complains of congestion, cough described as productive of green sputum and chills for 3 days. She denies a history of chest pain, nausea, shortness of breath, nausea or vomiting, body aches and wheezing and denies a history of asthma or COPD. Patient does smoke cigarettes. She was recently hospitalized at behavioral health for detox and there were three other people there with similar symptoms.   OBJECTIVE: She appears well, vital signs are as noted. Ears normal.  Throat and pharynx normal.  Neck supple. No adenopathy in the neck. Nose is congested. Sinuses non tender. The chest is clear, without wheezes or rales.

## 2012-06-19 ENCOUNTER — Encounter (HOSPITAL_COMMUNITY): Payer: Self-pay | Admitting: Psychology

## 2012-06-19 ENCOUNTER — Other Ambulatory Visit (HOSPITAL_COMMUNITY): Payer: No Typology Code available for payment source | Attending: Psychiatry | Admitting: Psychology

## 2012-06-19 DIAGNOSIS — F1920 Other psychoactive substance dependence, uncomplicated: Secondary | ICD-10-CM

## 2012-06-19 DIAGNOSIS — F1320 Sedative, hypnotic or anxiolytic dependence, uncomplicated: Secondary | ICD-10-CM | POA: Insufficient documentation

## 2012-06-19 DIAGNOSIS — F1020 Alcohol dependence, uncomplicated: Secondary | ICD-10-CM | POA: Insufficient documentation

## 2012-06-20 LAB — PRESCRIPTION ABUSE MONITORING 17P, URINE
Buprenorphine, Urine: NEGATIVE ng/mL
Cocaine Metabolites: NEGATIVE ng/mL
Creatinine, Urine: 26.39 mg/dL (ref >20.0)
MDMA URINE: NEGATIVE ng/mL
Meperidine, Ur: NEGATIVE ng/mL
Methadone Screen, Urine: NEGATIVE ng/mL
Opiate Screen, Urine: NEGATIVE ng/mL
Tapentadol, urine: NEGATIVE ng/mL
Tramadol Scrn, Ur: NEGATIVE ng/mL
Zolpidem, Urine: NEGATIVE ng/mL

## 2012-06-21 ENCOUNTER — Other Ambulatory Visit (HOSPITAL_COMMUNITY): Payer: No Typology Code available for payment source | Admitting: Psychology

## 2012-06-21 DIAGNOSIS — F1020 Alcohol dependence, uncomplicated: Secondary | ICD-10-CM

## 2012-06-21 DIAGNOSIS — F1220 Cannabis dependence, uncomplicated: Secondary | ICD-10-CM

## 2012-06-21 LAB — BENZODIAZEPINES (GC/LC/MS), URINE
Alprazolam (GC/LC/MS), ur confirm: NEGATIVE ng/mL
Alprazolam metabolite (GC/LC/MS), ur confirm: NEGATIVE ng/mL
Estazolam (GC/LC/MS), ur confirm: NEGATIVE ng/mL
Flunitrazepam metabolite (GC/LC/MS), ur confirm: NEGATIVE ng/mL
Temazepam (GC/LC/MS), ur confirm: NEGATIVE ng/mL

## 2012-06-21 NOTE — Discharge Summary (Signed)
Agree with assessment and plan Legrand Lasser A. Kaisey Huseby, M.D. 

## 2012-06-22 ENCOUNTER — Encounter (HOSPITAL_COMMUNITY): Payer: Self-pay | Admitting: Psychology

## 2012-06-22 ENCOUNTER — Ambulatory Visit (INDEPENDENT_AMBULATORY_CARE_PROVIDER_SITE_OTHER): Payer: No Typology Code available for payment source | Admitting: Family Medicine

## 2012-06-22 ENCOUNTER — Encounter: Payer: Self-pay | Admitting: Family Medicine

## 2012-06-22 VITALS — BP 149/91 | HR 92 | Temp 98.3°F | Ht 66.0 in | Wt 145.4 lb

## 2012-06-22 DIAGNOSIS — F411 Generalized anxiety disorder: Secondary | ICD-10-CM

## 2012-06-22 DIAGNOSIS — Z23 Encounter for immunization: Secondary | ICD-10-CM

## 2012-06-22 DIAGNOSIS — F419 Anxiety disorder, unspecified: Secondary | ICD-10-CM

## 2012-06-22 DIAGNOSIS — H269 Unspecified cataract: Secondary | ICD-10-CM

## 2012-06-22 DIAGNOSIS — F1910 Other psychoactive substance abuse, uncomplicated: Secondary | ICD-10-CM

## 2012-06-22 DIAGNOSIS — K219 Gastro-esophageal reflux disease without esophagitis: Secondary | ICD-10-CM

## 2012-06-22 DIAGNOSIS — F1220 Cannabis dependence, uncomplicated: Secondary | ICD-10-CM | POA: Insufficient documentation

## 2012-06-22 DIAGNOSIS — F1020 Alcohol dependence, uncomplicated: Secondary | ICD-10-CM | POA: Insufficient documentation

## 2012-06-22 MED ORDER — ESCITALOPRAM OXALATE 10 MG PO TABS: 10.0000 mg | ORAL_TABLET | Freq: Every day | ORAL | Status: DC

## 2012-06-22 NOTE — Progress Notes (Unsigned)
    Daily Group Progress Note  Program: CD-IOP   Group Time: 1-2:30 pm  Participation Level: Active  Behavioral Response: Appropriate and Sharing  Type of Therapy: Process Group  Topic: Group Process: first part of group was spent in process. Members checked-in and shared about current stressors and issues in early recovery. Two group members had relapsed since the last session and we discuss the events leading up to the relapse at length. Group members shared about what they have done in similar circumstances or how they have dealt with particular stressors or problems with spouse and/or loved ones. There was good disclosure within members, but the rules for group were also reviewed and members reminded they must assume some responsibility for the group and for identifying and pointing addictive and dysfunctional behaviors, attitudes and thinking among group members.   Group Time: 2:45- 4pm  Participation Level: Active  Behavioral Response: Sharing  Type of Therapy: Psycho-education Group  Topic: The Serenity Prayer: identify 5 things you cannot change and 5 things you can. A psycho-ed piece was provided in the second half of group. Members were provided handouts with a brief exercise asking them to identify things they can and cannot change. A discussion ensued discussing those very things. The importance of focusing on things that are within one's power to change must be identified.   Summary: The patient checked-in with a sobriety date of today and admitted she had smoked cannabis yesterday. She was asked to review her day and she explained she had gotten up yesterday, read her bible readings for the day, went to lunch with an old friend, and returned home around 4 pm. She walked directly to the back porch where she noted she had hidden about a half a joint. She smoked it before she even realized what she had done. Another group member asked if she had any more drugs in the house and  she denied having any alcohol or drugs. I instructed her to attend a women's AA meeting tomorrow at 10 am. The patient is not working, has her own vehicle, is getting all kinds of assistance from various organizations and must develop some sort of support network if she is to remain abstinent. The patient displays poor insight into her addiction and recovery and got confused about what she can and cannot control. The patient was very pleasant and attentive, but insight is minimal and it is unclear how willing she will be to eliminate cannabis. She reminded the group that she had smoked cannabis, ;longer than most of you have been alive". We will continue to follow closely in the days and weeks ahead.    Family Program: Family present? No   Name of family member(s):   UDS collected: No Results: but the last drug test was positive for THC and Benzos - we assume from the detox protocol upstairs.   AA/NA attended?: No, but she has been instructed to attend the 10 am women's meeting at the [facility]  Sponsor?: No, patient is just getting into treatment   Jayla Mackie, LCAS

## 2012-06-22 NOTE — Patient Instructions (Addendum)
Use the Ibuprofen 600 mg 2-3 times a day with food to help your back. Heat and massage like you're doing are good. It will take a few weeks to get you back feeling back to normal.  Come back sometime in the next 6 or so and we can get you set up for a mammogram and pap smear.  I'm glad that everything else is going really well!   Congratulations!

## 2012-06-23 ENCOUNTER — Other Ambulatory Visit (HOSPITAL_COMMUNITY): Payer: No Typology Code available for payment source | Admitting: Psychology

## 2012-06-23 ENCOUNTER — Encounter (HOSPITAL_COMMUNITY): Payer: Self-pay | Admitting: Psychology

## 2012-06-23 ENCOUNTER — Encounter: Payer: Self-pay | Admitting: Family Medicine

## 2012-06-23 DIAGNOSIS — F1020 Alcohol dependence, uncomplicated: Secondary | ICD-10-CM

## 2012-06-23 DIAGNOSIS — F1220 Cannabis dependence, uncomplicated: Secondary | ICD-10-CM

## 2012-06-23 NOTE — Progress Notes (Unsigned)
Patient ID: Karen Casey, female   DOB: 06-11-1956, 57 y.o.   MRN: [id number] Orientation to CD-IOP: The patient is a 57 yo single, caucasian, female seeking entry into the CD-IOP. She was discharged from [facility] late last week after successfully completing an alcohol and benzodiazepine detox. The patient lives in [place] with her S/O. She reported a long history of alcohol use that began in her teens. The patient stated that she was a social drinker for many years, but admitted being convicted of a DWI in mid-1980. 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 She has not worked since then and her unemployment benefits have been halted. Because of her financial struggles, the patient reported she had lost her home. After her job loss, the patient grew more depressed and despondent and her alcohol use increased significantly. She also reported smoking cannabis, which she first tried at age 43. She has smoked cannabis for many years and is a daily user. The patient lives with a man she has been with for 28 years. She denies that he has a problem with alcohol or cannabis and reported he will support her efforts in sobriety. The patient has one sister who also lives in [place] and the sister is very supportive and assists her financially. The patient has a 60 yo daughter who lives in [place] and she reported they enjoy a good relationship. The patient is seeking assistance to undergo surgery for a cataract on her right eye. The patient was pleasant and cooperative throughout the session and all documentation was signed and the orientation completed accordingly. The patient will return this afternoon and begin treatment in the CD-IOP.

## 2012-06-23 NOTE — Assessment & Plan Note (Signed)
Has not required any further PPI since leaving the hospital. No further nausea or GI upset. Likely secondary to combination of alcohol abuse, cannabis abuse, persistent anxiety and has now resolved

## 2012-06-23 NOTE — Assessment & Plan Note (Signed)
Karen Casey is doing much better today than I have seen her doing basically since I met her. Commended her on her progress. Did discuss that if she ever has concerns about relapse or she needs to talk on the here for her. She understands she will be going through intensive outpatient therapy for the next several weeks. Provided encouragement with this. Followup with me in 2 weeks to ensure she still doing okay

## 2012-06-23 NOTE — Assessment & Plan Note (Signed)
Scheduled to have this removed in late January.

## 2012-06-23 NOTE — Progress Notes (Signed)
  Subjective:    Patient ID: Karen Casey, female    DOB: 1956/01/10, 57 y.o.   MRN: [id number]  HPI Karen Casey is 57 year old female who has trouble with anxiety and benzodiazepine at dependence. She also had issues with alcohol dependence. Last time I saw her she decided to undergo outpatient treatment for this. However after further discussing this with outpatient therapist she decided that inpatient be much better.  She underwent inpatient therapy from December 27 to January 1. Since then she's been having Monday Wednesday Friday group meetings for several hours a day for outpatient therapy for alcohol rehabilitation. She feels she is doing much much better since leaving the hospital. She's had no relapses. She is not on any further benzodiazepines. She states she is not having cravings for these.  She does state that she is continuing to smoke. She smokes about a pack a day. Her anxiety is still there at baseline but is much more controlled than it was before.   Review of Systems See HPI above for review of systems.       Objective:   Physical Exam Gen:  Alert, cooperative patient who appears stated age in no acute distress.  Vital signs reviewed. Psych:   Well groomed and well dressed. Her tremor which she had before is now gone. She has a linear and coherent thought process. She is smiling and conversant. She no longer appears either depressed or anxious.       Assessment & Plan:

## 2012-06-23 NOTE — Assessment & Plan Note (Signed)
I am EXTREMELY pleased with how well Karen Casey has been doing. I think that inpatient rehab was the best choice for her. Will continue to support her and encouraged she continue to follow up with outpatient therapy.   FU in 2-3 weeks to assess for continued improvement.

## 2012-06-26 ENCOUNTER — Other Ambulatory Visit (HOSPITAL_COMMUNITY): Payer: No Typology Code available for payment source

## 2012-06-27 ENCOUNTER — Telehealth (HOSPITAL_COMMUNITY): Payer: Self-pay | Admitting: Psychology

## 2012-06-27 ENCOUNTER — Encounter (HOSPITAL_COMMUNITY): Payer: Self-pay | Admitting: Psychology

## 2012-06-27 NOTE — Progress Notes (Unsigned)
    Daily Group Progress Note  Program: CD-IOP   Group Time: 1-2:30 pm  Participation Level: Active  Behavioral Response: Appropriate and Sharing  Type of Therapy: Process Group  Topic: Group Process: first part of group was spent in process. Members shared bout current issues and concerns. One member, who had been in group on Wednesday feeling very badly after relapsing, shared that he was much better today. Other members shared bout their frustrations with family. One member denied she was an alcoholic and insisted she could drink socially. The group challenged her on this and she admitted feeling "attacked". The discussion was very frank with good feedback.  Group Time: 2:45- 4pm  Participation Level: Active  Behavioral Response: Sharing  Type of Therapy: Psycho-education Group  Topic: Family Roles in the Addictive Family System: Second half of group was spent in psycho-ed session. Members were provided handouts on the common roles played by family members in dysfunctional family systems. Some members could easily identify the roles they played within their family systems and others noted they had changed with time. The presentation provided good education and insight into roles and behaviors patte4rns members may have assumed as a way to keep balance within their family system.   Summary: the patient reported was doing well and had remained sober since the last group session. She reported she had attended an AA meeting and received a warm welcome from the women in attendance. The patient reported her family life growing up had been very loving, but she noted that although her parents didn't drink, they both had addiction in their families. She reported that while she had been a drinker and smoker from her late teens, her sister rarely drank and only 1 glass of wine if anything at all. The patient was unable to identify any of the roles in the addictive family, but then noted that her  older sister was the hero and did everything really well while she did not meet those same expectations. During the process session, the patient was called out by the medical director and he met with her as is the protocol for the CD-IOP.  She returned and stated that things had gone well. The patient displays poor insight about some of her motivations and intent, but appears very open and receptive to learning.   Family Program: Family present? No   Name of family member(s):   UDS collected: No Results:   AA/NA attended?: YesThursday  Sponsor?: No, but she has been encouraged to secure a temporary sponsor in the next few days   Jessie Schrieber, LCAS

## 2012-06-27 NOTE — Progress Notes (Unsigned)
Patient ID: Karen Casey, female   DOB: 05/08/1956, 57 y.o.   MRN: [id number] CD-IOP:Treatment Planning Session. I met with this patient at the conclusion of group today. I explained the importance of identifying goals for treatment. The patient reported she was committed to remaining alcohol and drug-free and getting her life back. She reported she had enjoyed the women's  AA meeting yesterday and I emphasized going to the 2 meetings at the [facility] tomorrow and Sunday. The patient reported she had agreed to attend church with her 46 yo daughter, [name], and they were planning on attending the 11 am service on Sunday. I supported this decision and encouraged her to make it a weekly commitment that just the two of them can share together. The patient agreed with the importance of having support and stated she would attend AA meetings. When asked to identify anything else she wants to accomplish while in treatment here, the patient reported she wanted to get her eyes fixed. She has a cataract on her right eye and can't see out of it. She reminded me how difficult it is to drive with no depth perception. These 3 goals were identified, the treatment plan signed and completed. The patient reported how pleased she is to be here and how excited she is about making changes and getting back in the work force one day. We will continue to follow her closely in the days and weeks ahead.

## 2012-06-28 ENCOUNTER — Other Ambulatory Visit (HOSPITAL_COMMUNITY): Payer: No Typology Code available for payment source | Admitting: Psychology

## 2012-06-28 DIAGNOSIS — F1320 Sedative, hypnotic or anxiolytic dependence, uncomplicated: Secondary | ICD-10-CM

## 2012-06-28 DIAGNOSIS — F1020 Alcohol dependence, uncomplicated: Secondary | ICD-10-CM

## 2012-06-28 DIAGNOSIS — F1220 Cannabis dependence, uncomplicated: Secondary | ICD-10-CM

## 2012-06-29 LAB — PRESCRIPTION ABUSE MONITORING 17P, URINE
Buprenorphine, Urine: NEGATIVE ng/mL
Carisoprodol, Urine: NEGATIVE ng/mL
Cocaine Metabolites: NEGATIVE ng/mL
Creatinine, Urine: 31.63 mg/dL (ref >20.0)
Meperidine, Ur: NEGATIVE ng/mL
Methadone Screen, Urine: NEGATIVE ng/mL
Tapentadol, urine: NEGATIVE ng/mL

## 2012-06-29 LAB — ETHYL GLUCURONIDE, URINE: Ethyl Glucuronide (EtG): 1295 ng/mL — ABNORMAL HIGH

## 2012-06-29 LAB — ALCOHOL METABOLITE (ETG), URINE

## 2012-06-30 ENCOUNTER — Other Ambulatory Visit (HOSPITAL_COMMUNITY): Payer: No Typology Code available for payment source | Admitting: Psychology

## 2012-06-30 DIAGNOSIS — F1020 Alcohol dependence, uncomplicated: Secondary | ICD-10-CM

## 2012-06-30 DIAGNOSIS — F1220 Cannabis dependence, uncomplicated: Secondary | ICD-10-CM

## 2012-06-30 LAB — BENZODIAZEPINES (GC/LC/MS), URINE
Alprazolam (GC/LC/MS), ur confirm: NEGATIVE ng/mL
Estazolam (GC/LC/MS), ur confirm: NEGATIVE ng/mL
Oxazepam (GC/LC/MS), ur confirm: 120 ng/mL
Temazepam (GC/LC/MS), ur confirm: NEGATIVE ng/mL

## 2012-07-03 ENCOUNTER — Other Ambulatory Visit (HOSPITAL_COMMUNITY): Payer: Self-pay | Admitting: Physician Assistant

## 2012-07-03 ENCOUNTER — Other Ambulatory Visit (HOSPITAL_COMMUNITY): Payer: No Typology Code available for payment source

## 2012-07-03 ENCOUNTER — Encounter (HOSPITAL_COMMUNITY): Payer: Self-pay | Admitting: Psychology

## 2012-07-03 NOTE — Progress Notes (Signed)
    Daily Group Progress Note  Program: CD-IOP   Group Time: 1-2:30 pm  Participation Level: Active  Behavioral Response: Appropriate  Type of Therapy: Process Group  Topic: Group Process: first part of group was spent in process. A new group member was present today and he was asked to introduce himself and tell the group what he needed from them. The new group member was asked numerous questions and he admitted things about his alcohol use that had not been disclosed previously. The group welcomed him and stated that he appeared to belong in this program. There was a good exchange and excellent feedback in this session.   Group Time: 2:45- 4 pm  Participation Level: Active  Behavioral Response: Sharing  Type of Therapy: Psycho-education Group  Topic: The Criteria for a Substance Dependency Diagnosis: The second half of group included a psycho-ed presentation identifying the diagnostic criteria for substance dependence. A handout taken directly from the DSM-IV was provided to all members listing the criteria. All 7 criteria were discussed and, at the conclusion of this review, all of the group members, with the exception of the new member, reported they had met all 7 of the criteria. When the new member was questioned, he identified 3 criteria that he met. The session concluded with members sharing their plans for the remainder of the day and tomorrow until we meet again on Friday.  The session proved effective in eliminating any denial about the members' appropriateness in being in this program.    Summary: The patient was attentive and reported she had remained sober with no thoughts about using. She reported having attended an AA meeting at the [facility] yesterday and how friendly and supportive everyone had been. She reported she had met with her doctor on Monday and was scheduled to have her cataract surgery next Wednesday. In the second half of group, the patient reported she  met all 7 criteria for substance dependence. She was definitely an alcoholic and addict. She provided a UA sample as requested since she had missed group on Monday.    Family Program: Family present? No   Name of family member(s):   UDS collected: Yes Results: the UA was returned 2 days later positive for alcohol  AA/NA attended?: YesTuesday  Sponsor?: No, patient has just begun attending meetings   Jarmarcus Wambold, LCAS

## 2012-07-03 NOTE — Progress Notes (Signed)
    Daily Group Progress Note  Program: CD-IOP   Group Time: 1-2:30 pm  Participation Level: Active  Behavioral Response: Sharing  Type of Therapy: Process Group  Topic: Group Process: first part of group was spent in process. Members shared about current issues and concerns in early recovery. One member was confronted about an alcohol positive UA and her failure to report the relapse. She admitted she had been 'ashamed'. The group provided encouragement and reminded her that many of them had also relapsed. Another member discouraged her from being ashamed and reminded her it was a disease. There was good feedback among group members.   Group Time: 2:45- 4pm  Participation Level: Active  Behavioral Response: Sharing  Type of Therapy: Activity Group  Topic: Second half of group was spent in an activity where members shared poems, lyrics, memories or stories that proved inspiration to them. The group seemed shy at first, but after the first member shared his inspirational song, everyone seemed to relax. The disclosures were very touching, brought to tears to one member's eyes, and the group agreed they felt closer than they had in the past. The session proved effective and, indeed, it was inspirational.   Summary:The patient reported she had gone shopping and spent most of yesterday with a friend. She denied having gone to a meeting and had no explanation for not having gone. She reported her last AA meeting had been on Tuesday. I handed back her drug test results and asked if she had forgotten to tell us something? She admitted that she had not mentioned that she had drank on Tuesday evening. She admitted she had gotten upset and before she could even think, she had driven to the convenience store and drank a 40 ounce beer. She admitted she hadn't thought about calling anyone or doing anything else. When I asked why she hadn't disclosed this, the patient reported she had been 'ashamed'.  the group provided good feedback and I emphasized the importance of honesty in recovery. She admitted she felt better now having told the group about her relapse. The patient brought a prayer from her daily readings and another member read it for her. She explained that with just one eye she doesn't read well. The patient does not seem to understand the importance of support and reaching out in early recovery. There may be some cognitive obstacles, but when we meet again next week for our individual therapy session, we will review a daily schedule. The patient has little structure in her life and this has been the case for almost 3 years.      Family Program: Family present? No   Name of family member(s):   UDS collected: No Results:   AA/NA attended?:Yes, but patient admitted she had not attended a meeting since Tuesday  Sponsor?: No   Solace Wendorff, LCAS

## 2012-07-05 ENCOUNTER — Other Ambulatory Visit (HOSPITAL_COMMUNITY): Payer: No Typology Code available for payment source

## 2012-07-06 ENCOUNTER — Telehealth (HOSPITAL_COMMUNITY): Payer: Self-pay | Admitting: Psychology

## 2012-07-07 ENCOUNTER — Encounter (HOSPITAL_COMMUNITY): Payer: Self-pay | Admitting: Psychology

## 2012-07-07 ENCOUNTER — Other Ambulatory Visit (HOSPITAL_COMMUNITY): Payer: No Typology Code available for payment source | Admitting: Psychology

## 2012-07-07 DIAGNOSIS — F1320 Sedative, hypnotic or anxiolytic dependence, uncomplicated: Secondary | ICD-10-CM

## 2012-07-07 DIAGNOSIS — F1020 Alcohol dependence, uncomplicated: Secondary | ICD-10-CM

## 2012-07-07 MED ORDER — HYDROXYZINE HCL 50 MG PO TABS: 50.0000 mg | ORAL_TABLET | Freq: Every evening | ORAL | Status: DC | PRN

## 2012-07-07 MED ORDER — TRAZODONE HCL 100 MG PO TABS: 100.0000 mg | ORAL_TABLET | Freq: Every day | ORAL | Status: DC

## 2012-07-08 LAB — ALCOHOL METABOLITE (ETG), URINE

## 2012-07-10 ENCOUNTER — Other Ambulatory Visit (HOSPITAL_COMMUNITY): Payer: No Typology Code available for payment source

## 2012-07-10 ENCOUNTER — Encounter (HOSPITAL_COMMUNITY): Payer: Self-pay | Admitting: *Deleted

## 2012-07-10 ENCOUNTER — Ambulatory Visit (HOSPITAL_COMMUNITY)
Admission: RE | Admit: 2012-07-10 | Discharge: 2012-07-10 | Disposition: A | Discharge status: Home / Self Care | Payer: No Typology Code available for payment source | Attending: Psychiatry | Admitting: Psychiatry

## 2012-07-10 ENCOUNTER — Telehealth (HOSPITAL_COMMUNITY): Payer: Self-pay

## 2012-07-10 ENCOUNTER — Telehealth (HOSPITAL_COMMUNITY): Payer: Self-pay | Admitting: Psychology

## 2012-07-10 ENCOUNTER — Emergency Department (HOSPITAL_COMMUNITY)
Admission: EM | Admit: 2012-07-10 | Discharge: 2012-07-11 | Disposition: A | Discharge status: Home / Self Care | Payer: No Typology Code available for payment source | Attending: Emergency Medicine | Admitting: Emergency Medicine

## 2012-07-10 ENCOUNTER — Encounter (HOSPITAL_COMMUNITY): Payer: Self-pay | Admitting: Psychology

## 2012-07-10 ENCOUNTER — Encounter (HOSPITAL_COMMUNITY): Payer: Self-pay

## 2012-07-10 DIAGNOSIS — F1910 Other psychoactive substance abuse, uncomplicated: Secondary | ICD-10-CM

## 2012-07-10 DIAGNOSIS — F1010 Alcohol abuse, uncomplicated: Secondary | ICD-10-CM | POA: Insufficient documentation

## 2012-07-10 DIAGNOSIS — F329 Major depressive disorder, single episode, unspecified: Secondary | ICD-10-CM | POA: Insufficient documentation

## 2012-07-10 DIAGNOSIS — Z9889 Other specified postprocedural states: Secondary | ICD-10-CM | POA: Insufficient documentation

## 2012-07-10 DIAGNOSIS — Z87448 Personal history of other diseases of urinary system: Secondary | ICD-10-CM | POA: Insufficient documentation

## 2012-07-10 DIAGNOSIS — F1210 Cannabis abuse, uncomplicated: Secondary | ICD-10-CM | POA: Insufficient documentation

## 2012-07-10 DIAGNOSIS — F3289 Other specified depressive episodes: Secondary | ICD-10-CM | POA: Insufficient documentation

## 2012-07-10 DIAGNOSIS — R109 Unspecified abdominal pain: Secondary | ICD-10-CM | POA: Insufficient documentation

## 2012-07-10 DIAGNOSIS — K219 Gastro-esophageal reflux disease without esophagitis: Secondary | ICD-10-CM | POA: Insufficient documentation

## 2012-07-10 DIAGNOSIS — F17200 Nicotine dependence, unspecified, uncomplicated: Secondary | ICD-10-CM | POA: Insufficient documentation

## 2012-07-10 DIAGNOSIS — Z8669 Personal history of other diseases of the nervous system and sense organs: Secondary | ICD-10-CM | POA: Insufficient documentation

## 2012-07-10 DIAGNOSIS — Z79899 Other long term (current) drug therapy: Secondary | ICD-10-CM | POA: Insufficient documentation

## 2012-07-10 DIAGNOSIS — F411 Generalized anxiety disorder: Secondary | ICD-10-CM | POA: Insufficient documentation

## 2012-07-10 HISTORY — DX: Alcohol abuse, uncomplicated: F10.10

## 2012-07-10 HISTORY — DX: Unspecified convulsions: R56.9

## 2012-07-10 LAB — RAPID URINE DRUG SCREEN, HOSP PERFORMED
Amphetamines: NOT DETECTED
Benzodiazepines: NOT DETECTED
Opiates: NOT DETECTED
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
## 2012-07-10 NOTE — ED Notes (Signed)
Pt went to [facility] for etoh detox and brought here by security for medical clearance- denies SI/HI- has been going to outpt meetings at [facility]- admits to using etoh, marijuana and xanax- reports recent job loss
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
## 2012-07-10 NOTE — Progress Notes (Unsigned)
    Daily Group Progress Note  Program: CD-IOP   Group Time: 1-2:30 pm  Participation Level: Active  Behavioral Response: Appropriate  Type of Therapy: Process Group  Topic: Group Process: First part of group spent in process. Members discussed current issues and concerns. A new group member was present today and he was asked to share what has brought him here to the program. Another member was welcomed back after surgery for a cataract. Another member was asked to explain his positive drug test and failure to disclose use. There was good discussion among the group and members challenged each other and discussed other ways to address temptations.  Group Time: 2:45- 4pm  Participation Level: Active  Behavioral Response: Sharing  Type of Therapy: Psycho-education Group  Topic: Psycho-education: Second half of group was spent in a Psycho-ed piece on the behavioral therapy and cognitive behavioral therapy. Members were provided with examples of each type of therapy relative to recovery from chemical dependency. Members were asked to identify ways they have changed their behaviors and ways of thinking and how those changes support sobriety. Emphasis was placed on the importance of honesty and accountability in early recovery.   Summary:The patient appeared today for group after missing Wednesday due to Cataract surgery. She was very happy and reported she could see better immediately after the surgery. She reported it is so wonderful to be able to see again. The group provided good feedback and support for this gift of vision that she has not had in a long time. The patient denied having attended any meetings for a week and I emphasized the importance of attending meetings and building support with other women in recovery. I reminded her that she is not working and has no obligations through the week beyond this program. The patient reported she intends to go to church this Sunday with her  daughter and will definitely attend at least one AA meeting as well.  Family Program: Family present? No   Name of family member(s):   UDS collected: No Results:   AA/NA attended?: No  Sponsor?: No   Ardean Melroy, LCAS

## 2012-07-11 LAB — CBC
Platelets: 223 10*3/uL (ref 150–400)
RBC: 4.56 MIL/uL (ref 3.87–5.11)
WBC: 7.1 10*3/uL (ref 4.0–10.5)

## 2012-07-11 LAB — BASIC METABOLIC PANEL
CO2: 25 mEq/L (ref 19–32)
Chloride: 101 mEq/L (ref 96–112)
Sodium: 138 mEq/L (ref 135–145)

## 2012-07-11 LAB — ETHANOL: Alcohol, Ethyl (B): 37 mg/dL — ABNORMAL HIGH (ref 0–11)

## 2012-07-11 MED ORDER — LORAZEPAM 1 MG PO TABS: 1.0000 mg | ORAL_TABLET | Freq: Three times a day (TID) | ORAL | Status: DC | PRN

## 2012-07-11 MED ORDER — VITAMIN B-1 100 MG PO TABS
100.0000 mg | ORAL_TABLET | Freq: Every day | ORAL | Status: DC
  Administered 2012-07-11: 100 mg via ORAL
  Filled 2012-07-11: qty 1

## 2012-07-11 MED ORDER — ACETAMINOPHEN 325 MG PO TABS: 650.0000 mg | ORAL_TABLET | ORAL | Status: DC | PRN

## 2012-07-11 MED ORDER — FOLIC ACID 1 MG PO TABS
1.0000 mg | ORAL_TABLET | Freq: Every day | ORAL | Status: DC
  Administered 2012-07-11: 1 mg via ORAL
  Filled 2012-07-11: qty 1

## 2012-07-11 MED ORDER — ADULT MULTIVITAMIN W/MINERALS CH
1.0000 | ORAL_TABLET | Freq: Every day | ORAL | Status: DC
  Administered 2012-07-11: 1 via ORAL
  Filled 2012-07-11: qty 1

## 2012-07-11 MED ORDER — ESCITALOPRAM OXALATE 10 MG PO TABS
10.0000 mg | ORAL_TABLET | Freq: Every day | ORAL | Status: DC
  Administered 2012-07-11: 10 mg via ORAL
  Filled 2012-07-11 (×2): qty 1

## 2012-07-11 MED ORDER — ONDANSETRON HCL 4 MG PO TABS: 4.0000 mg | ORAL_TABLET | Freq: Three times a day (TID) | ORAL | Status: DC | PRN

## 2012-07-11 MED ORDER — THIAMINE HCL 100 MG/ML IJ SOLN: 100.0000 mg | Freq: Every day | INTRAMUSCULAR | Status: DC

## 2012-07-11 MED ORDER — LORAZEPAM 1 MG PO TABS
0.0000 mg | ORAL_TABLET | Freq: Four times a day (QID) | ORAL | Status: DC
  Administered 2012-07-11: 2 mg via ORAL
  Filled 2012-07-11: qty 2

## 2012-07-11 MED ORDER — LORAZEPAM 1 MG PO TABS
0.0000 mg | ORAL_TABLET | Freq: Two times a day (BID) | ORAL | Status: DC
Start: 2012-07-13 — End: 2012-07-11

## 2012-07-11 MED ORDER — TRAZODONE HCL 100 MG PO TABS
100.0000 mg | ORAL_TABLET | Freq: Every day | ORAL | Status: DC
  Administered 2012-07-11: 100 mg via ORAL
  Filled 2012-07-11: qty 1

## 2012-07-11 MED ORDER — PANTOPRAZOLE SODIUM 40 MG PO TBEC
40.0000 mg | DELAYED_RELEASE_TABLET | Freq: Every day | ORAL | Status: DC
  Administered 2012-07-11: 40 mg via ORAL
  Filled 2012-07-11: qty 1

## 2012-07-11 MED ORDER — LORAZEPAM 1 MG PO TABS: 1.0000 mg | ORAL_TABLET | Freq: Four times a day (QID) | ORAL | Status: DC | PRN

## 2012-07-11 MED ORDER — IBUPROFEN 200 MG PO TABS: 600.0000 mg | ORAL_TABLET | Freq: Three times a day (TID) | ORAL | Status: DC | PRN

## 2012-07-11 MED ORDER — LORAZEPAM 2 MG/ML IJ SOLN: 1.0000 mg | Freq: Four times a day (QID) | INTRAMUSCULAR | Status: DC | PRN

## 2012-07-11 MED ORDER — NICOTINE 21 MG/24HR TD PT24
21.0000 mg | MEDICATED_PATCH | Freq: Every day | TRANSDERMAL | Status: DC
  Administered 2012-07-11: 21 mg via TRANSDERMAL
  Filled 2012-07-11: qty 1

## 2012-07-11 MED ORDER — ALUM & MAG HYDROXIDE-SIMETH 200-200-20 MG/5ML PO SUSP: 30.0000 mL | ORAL | Status: DC | PRN

## 2012-07-11 NOTE — ED Provider Notes (Signed)
Medical screening examination/treatment/procedure(s) were performed by non-physician practitioner and as supervising physician I was immediately available for consultation/collaboration.   Lyanne Co, MD 07/11/12 [phone]

## 2012-07-11 NOTE — BHH Counselor (Addendum)
Contacted [facility] again to follow up with bed availability. Spoke to [place], she asked that paperwork (clinicals and labs) is faxed and she will call back once reviewed. Informed Shayla that paperwork was already faxed. Shayla apparently checked the fax machine and verified that fax was received.   Awaiting a call back from [facility] with patients disposition.

## 2012-07-11 NOTE — ED Provider Notes (Signed)
Patient is requesting substance abuse treatment. She is medically stable this morning. The patient was wondering if she was going to go back to [facility]. I spoke with the act team.  They do not have beds available for her and the plan is to try to arrange treatment at another facility. The patient was informed of this plan and states she is willing to wait right now.  Celene Kras, MD 07/11/12 [phone]

## 2012-07-11 NOTE — BHH Counselor (Signed)
Patient is pending a referral to [facility] for substance abuse treatment. Contacted [facility] this am approx. 0900 to inquire about bed available. Writer was told that nurses were still passing out meds and to call back approx. 0930.

## 2012-07-12 ENCOUNTER — Other Ambulatory Visit (HOSPITAL_COMMUNITY): Payer: No Typology Code available for payment source

## 2012-07-14 ENCOUNTER — Other Ambulatory Visit (HOSPITAL_COMMUNITY): Payer: No Typology Code available for payment source

## 2012-07-17 ENCOUNTER — Other Ambulatory Visit (HOSPITAL_COMMUNITY): Payer: No Typology Code available for payment source | Attending: Psychiatry

## 2012-07-18 ENCOUNTER — Telehealth (HOSPITAL_COMMUNITY): Payer: Self-pay | Admitting: Psychology

## 2012-07-19 ENCOUNTER — Other Ambulatory Visit (HOSPITAL_COMMUNITY): Payer: No Typology Code available for payment source

## 2012-07-21 ENCOUNTER — Other Ambulatory Visit (HOSPITAL_COMMUNITY): Payer: No Typology Code available for payment source

## 2012-07-24 ENCOUNTER — Other Ambulatory Visit (HOSPITAL_COMMUNITY): Payer: No Typology Code available for payment source

## 2012-07-26 ENCOUNTER — Other Ambulatory Visit (HOSPITAL_COMMUNITY): Payer: No Typology Code available for payment source

## 2012-07-28 ENCOUNTER — Other Ambulatory Visit (HOSPITAL_COMMUNITY): Payer: No Typology Code available for payment source

## 2012-07-31 ENCOUNTER — Other Ambulatory Visit (HOSPITAL_COMMUNITY): Payer: No Typology Code available for payment source

## 2012-08-02 ENCOUNTER — Other Ambulatory Visit (HOSPITAL_COMMUNITY): Payer: No Typology Code available for payment source

## 2012-08-03 ENCOUNTER — Encounter (HOSPITAL_COMMUNITY): Payer: Self-pay | Admitting: Emergency Medicine

## 2012-08-03 ENCOUNTER — Emergency Department (HOSPITAL_COMMUNITY)
Admission: EM | Admit: 2012-08-03 | Discharge: 2012-08-04 | Disposition: A | Discharge status: Home / Self Care | Payer: No Typology Code available for payment source | Attending: Emergency Medicine | Admitting: Emergency Medicine

## 2012-08-03 DIAGNOSIS — Z9849 Cataract extraction status, unspecified eye: Secondary | ICD-10-CM | POA: Diagnosis not present

## 2012-08-03 DIAGNOSIS — F1010 Alcohol abuse, uncomplicated: Secondary | ICD-10-CM | POA: Diagnosis not present

## 2012-08-03 DIAGNOSIS — F411 Generalized anxiety disorder: Secondary | ICD-10-CM | POA: Diagnosis not present

## 2012-08-03 DIAGNOSIS — Z87448 Personal history of other diseases of urinary system: Secondary | ICD-10-CM | POA: Diagnosis not present

## 2012-08-03 DIAGNOSIS — F3289 Other specified depressive episodes: Secondary | ICD-10-CM | POA: Insufficient documentation

## 2012-08-03 DIAGNOSIS — K219 Gastro-esophageal reflux disease without esophagitis: Secondary | ICD-10-CM | POA: Insufficient documentation

## 2012-08-03 DIAGNOSIS — Z8669 Personal history of other diseases of the nervous system and sense organs: Secondary | ICD-10-CM | POA: Diagnosis not present

## 2012-08-03 DIAGNOSIS — Z008 Encounter for other general examination: Secondary | ICD-10-CM | POA: Diagnosis present

## 2012-08-03 DIAGNOSIS — F32A Depression, unspecified: Secondary | ICD-10-CM

## 2012-08-03 DIAGNOSIS — F17200 Nicotine dependence, unspecified, uncomplicated: Secondary | ICD-10-CM | POA: Insufficient documentation

## 2012-08-03 LAB — COMPREHENSIVE METABOLIC PANEL
ALT: 12 U/L (ref 0–35)
AST: 17 U/L (ref 0–37)
Alkaline Phosphatase: 97 U/L (ref 39–117)
CO2: 25 mEq/L (ref 19–32)
Calcium: 8.8 mg/dL (ref 8.4–10.5)
GFR calc non Af Amer: >90 mL/min (ref >90)
Potassium: 3.7 mEq/L (ref 3.5–5.1)
Sodium: 135 mEq/L (ref 135–145)

## 2012-08-03 LAB — CBC WITH DIFFERENTIAL/PLATELET
Basophils Absolute: 0 10*3/uL (ref 0.0–0.1)
Eosinophils Relative: 1 % (ref 0–5)
Lymphocytes Relative: 20 % (ref 12–46)
MCV: 93.7 fL (ref 78.0–100.0)
Neutro Abs: 5.9 10*3/uL (ref 1.7–7.7)
Neutrophils Relative %: 71 % (ref 43–77)
Platelets: 209 10*3/uL (ref 150–400)
RBC: 4.44 MIL/uL (ref 3.87–5.11)
RDW: 12.1 % (ref 11.5–15.5)
WBC: 8.4 10*3/uL (ref 4.0–10.5)

## 2012-08-03 LAB — RAPID URINE DRUG SCREEN, HOSP PERFORMED
Amphetamines: NOT DETECTED
Barbiturates: NOT DETECTED
Benzodiazepines: NOT DETECTED

## 2012-08-03 LAB — URINALYSIS, ROUTINE W REFLEX MICROSCOPIC
Bilirubin Urine: NEGATIVE
Ketones, ur: NEGATIVE mg/dL
Nitrite: NEGATIVE
Urobilinogen, UA: 0.2 mg/dL (ref 0.0–1.0)

## 2012-08-03 LAB — ETHANOL: Alcohol, Ethyl (B): 194 mg/dL — ABNORMAL HIGH (ref 0–11)

## 2012-08-03 MED ORDER — LORAZEPAM 1 MG PO TABS
1.0000 mg | ORAL_TABLET | Freq: Three times a day (TID) | ORAL | Status: DC | PRN
  Administered 2012-08-03: 1 mg via ORAL
  Filled 2012-08-03: qty 1

## 2012-08-03 MED ORDER — PANTOPRAZOLE SODIUM 40 MG PO TBEC
40.0000 mg | DELAYED_RELEASE_TABLET | Freq: Every day | ORAL | Status: DC
  Administered 2012-08-03: 40 mg via ORAL
  Filled 2012-08-03: qty 1

## 2012-08-03 MED ORDER — NICOTINE 21 MG/24HR TD PT24
21.0000 mg | MEDICATED_PATCH | Freq: Every day | TRANSDERMAL | Status: DC
  Administered 2012-08-03: 21 mg via TRANSDERMAL
  Filled 2012-08-03: qty 1

## 2012-08-03 MED ORDER — HYDROXYZINE HCL 25 MG PO TABS: 50.0000 mg | ORAL_TABLET | Freq: Every evening | ORAL | Status: DC | PRN

## 2012-08-03 MED ORDER — IBUPROFEN 600 MG PO TABS: 600.0000 mg | ORAL_TABLET | Freq: Three times a day (TID) | ORAL | Status: DC | PRN

## 2012-08-03 MED ORDER — ADULT MULTIVITAMIN W/MINERALS CH
1.0000 | ORAL_TABLET | Freq: Every day | ORAL | Status: DC
  Administered 2012-08-03: 1 via ORAL
  Filled 2012-08-03: qty 1

## 2012-08-03 MED ORDER — TRAZODONE HCL 100 MG PO TABS
100.0000 mg | ORAL_TABLET | Freq: Every day | ORAL | Status: DC
  Administered 2012-08-03: 100 mg via ORAL
  Filled 2012-08-03: qty 1

## 2012-08-03 MED ORDER — ESCITALOPRAM OXALATE 10 MG PO TABS
10.0000 mg | ORAL_TABLET | Freq: Every day | ORAL | Status: DC
  Administered 2012-08-03: 10 mg via ORAL
  Filled 2012-08-03: qty 1

## 2012-08-03 NOTE — ED Notes (Signed)
Pt called [employer] due to she was SI due loss of job since 2012 states that she wants to harm herself. Pt was [facility] prior to coming over here. Pt is voluntary,calm
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
## 2012-08-03 NOTE — BH Assessment (Signed)
Assessment Note   Karen Casey is a 57 y.o. female presenting to [facility] for detox from alcohol.  Pt denies SI/HI/Psych.  Pt reports consuming 8-12 beers daily, last drink was 08/03/12.  Pt says she drank a 12pk of beer today.  Pt.'s stressors: (1) lost home, (2) lost job, (3) health problems, (4) financial problems, (4) relational problems.  Pt admits to using THC, 2-3 "hits", last used 08/01/12.  Pt c/o w/d sxs: anxiety, sweats, cold, sensitivity to light.  Pt denies any legal issues.  No hx of seizure activity but does have blackouts. Pt endorses depressive sxs: crying spells, isolation, mood swings.  Pt is tearful during assessment.    Axis I: Alcohol Dependence, Cannabis Abuse  Axis II: Deferred Axis III:  Past Medical History  Diagnosis Date  . GERD (gastroesophageal reflux disease)   . Anxiety   . Depression   . Cataract 06/09/2012    Right eye  . Urinary incontinence 06/09/2012  . 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    Event occurred in 2007  . Alcohol abuse   . Seizures     xanax withdrawl   Axis IV: economic problems, other psychosocial or environmental problems, problems related to social environment and problems with primary support group Axis V: 51-60 moderate symptoms  Past Medical History:  Past Medical History  Diagnosis Date  . GERD (gastroesophageal reflux disease)   . Anxiety   . Depression   . Cataract 06/09/2012    Right eye  . Urinary incontinence 06/09/2012  . 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    Event occurred in 2007  . Alcohol abuse   . Seizures     xanax withdrawl    Past Surgical History  Procedure Laterality Date  . Appendectomy    . Left shoulder dislocation  Sept 2011  . Cataract extraction  06/09/2012    Left eye    Family History:  Family History  Problem Relation Age of Onset  . Hypertension Mother   . Hyperlipidemia Mother   . Heart disease Father   . Depression Father   . Parkinsonism Father     Social History:  reports that she  has been smoking Cigarettes.  She has a 18.5 pack-year smoking history. She has never used smokeless tobacco. She reports that she drinks about 18.0 ounces of alcohol per week. She reports that she uses illicit drugs (Marijuana).  Additional Social History:  Alcohol / Drug Use Pain Medications: See MAR  Prescriptions: See MAR  Over the Counter: See MAR  History of alcohol / drug use?: Yes Longest period of sobriety (when/how long): None  Negative Consequences of Use: Financial;Personal relationships;Work / Programmer, multimedia Withdrawal Symptoms: Blackouts;Fever / Chills;Sweats Substance #1 Name of Substance 1: Alcohol--Beers   1 - Age of First Use: Teens  1 - Amount (size/oz): 8-12 Beers  1 - Frequency: Daily  1 - Duration: On-going  1 - Last Use / Amount: 08/03/12  CIWA: CIWA-Ar BP: 134/84 mmHg Pulse Rate: 86 Nausea and Vomiting: no nausea and no vomiting Tactile Disturbances: none Tremor: no tremor Auditory Disturbances: very mild harshness or ability to frighten Paroxysmal Sweats: barely perceptible sweating, palms moist Visual Disturbances: very mild sensitivity Anxiety: mildly anxious Headache, Fullness in Head: none present Agitation: normal activity Orientation and Clouding of Sensorium: oriented and can do serial additions CIWA-Ar Total: 4 COWS:    Allergies: No Known Allergies  Home Medications:  (Not in a hospital admission)  OB/GYN Status:  No LMP recorded. Patient is postmenopausal.  General Assessment Data Location of Assessment: [facility] Living Arrangements: Spouse/significant other Can pt return to current living arrangement?: Yes Admission Status: Voluntary Is patient capable of signing voluntary admission?: Yes Transfer from: Acute Hospital Referral Source: MD  Education Status Is patient currently in school?: No Current Grade: None  Highest grade of school patient has completed: None  Name of school: None  Contact person: None   Risk to self Suicidal  Ideation: No Suicidal Intent: No Is patient at risk for suicide?: No Suicidal Plan?: No Access to Means: No What has been your use of drugs/alcohol within the last 12 months?: Abusing: Alcohol  Previous Attempts/Gestures: No How many times?: 0 Other Self Harm Risks: None  Triggers for Past Attempts: None known Intentional Self Injurious Behavior: None Family Suicide History: No Recent stressful life event(s): Job Loss;Financial Problems;Conflict (Comment) (Relational issues ) Persecutory voices/beliefs?: No Depression: Yes Depression Symptoms: Insomnia;Tearfulness;Isolating;Loss of interest in usual pleasures;Feeling worthless/self pity Substance abuse history and/or treatment for substance abuse?: Yes Suicide prevention information given to non-admitted patients: Not applicable  Risk to Others Homicidal Ideation: No Thoughts of Harm to Others: No Current Homicidal Intent: No Current Homicidal Plan: No Access to Homicidal Means: No Identified Victim: None  History of harm to others?: No Assessment of Violence: None Noted Violent Behavior Description: None  Does patient have access to weapons?: No Criminal Charges Pending?: No Does patient have a court date: No  Psychosis Hallucinations: None noted Delusions: None noted  Mental Status Report Appear/Hygiene: Disheveled Eye Contact: Good Motor Activity: Unremarkable Speech: Logical/coherent;Pressured Level of Consciousness: Alert Mood: Depressed;Sad Affect: Depressed;Sad Anxiety Level: Minimal Thought Processes: Coherent;Relevant Judgement: Unimpaired Orientation: Person;Place;Time;Situation Obsessive Compulsive Thoughts/Behaviors: None  Cognitive Functioning Concentration: Normal Memory: Recent Intact;Remote Intact IQ: Average Insight: Fair Impulse Control: Fair Appetite: Good Weight Loss: 0 Weight Gain: 0 Sleep: Decreased Total Hours of Sleep: 4 Vegetative Symptoms: None  ADLScreening [facility] Assessment  Services) Patient's cognitive ability adequate to safely complete daily activities?: Yes Patient able to express need for assistance with ADLs?: Yes Independently performs ADLs?: Yes (appropriate for developmental age)  Abuse/Neglect [facility]) Physical Abuse: Denies Verbal Abuse: Denies Sexual Abuse: Denies  Prior Inpatient Therapy Prior Inpatient Therapy: Yes Prior Therapy Dates: 2013 Prior Therapy Facilty/Provider(s): [facility]  Reason for Treatment: Detox   Prior Outpatient Therapy Prior Outpatient Therapy: No Prior Therapy Dates: None  Prior Therapy Facilty/Provider(s): None  Reason for Treatment: None   ADL Screening (condition at time of admission) Patient's cognitive ability adequate to safely complete daily activities?: Yes Patient able to express need for assistance with ADLs?: Yes Independently performs ADLs?: Yes (appropriate for developmental age) Weakness of Legs: None Weakness of Arms/Hands: None  Home Assistive Devices/Equipment Home Assistive Devices/Equipment: None  Therapy Consults (therapy consults require a physician order) PT Evaluation Needed: No OT Evalulation Needed: No SLP Evaluation Needed: No Abuse/Neglect Assessment (Assessment to be complete while patient is alone) Physical Abuse: Denies Verbal Abuse: Denies Sexual Abuse: Denies Exploitation of patient/patient's resources: Denies Self-Neglect: Denies Values / Beliefs Cultural Requests During Hospitalization: None Spiritual Requests During Hospitalization: None Consults Spiritual Care Consult Needed: No Social Work Consult Needed: No Merchant navy officer (For Healthcare) Advance Directive: Patient does not have advance directive;Patient would not like information Pre-existing out of facility DNR order (yellow form or pink MOST form): No Nutrition Screen- [facility] Patient's home diet: Regular Have you recently lost weight without trying?: No Have you been eating poorly because of a decreased  appetite?: No Malnutrition Screening Tool Score: 0  Additional Information 1:1 In Past 12 Months?: No CIRT Risk: No Elopement Risk: No Does patient have medical clearance?: Yes     Disposition:  Disposition Disposition of Patient: Inpatient treatment program;Referred to [facility] ) Type of inpatient treatment program: Adult Patient referred to: Other (Comment) [facility] )  On Site Evaluation by:   Reviewed with Physician:     Murrell Redden 08/03/2012 10:03 PM

## 2012-08-04 ENCOUNTER — Encounter (HOSPITAL_COMMUNITY): Payer: Self-pay

## 2012-08-04 ENCOUNTER — Inpatient Hospital Stay (HOSPITAL_COMMUNITY)
Admission: EM | Admit: 2012-08-04 | Discharge: 2012-08-09 | DRG: 897 | Disposition: A | Discharge status: Home / Self Care | Payer: Federal, State, Local not specified - Other | Source: Intra-hospital | Attending: Psychiatry | Admitting: Psychiatry

## 2012-08-04 ENCOUNTER — Inpatient Hospital Stay
Admission: EM | Admit: 2012-08-04 | Payer: No Typology Code available for payment source | Source: Intra-hospital | Admitting: Psychiatry

## 2012-08-04 ENCOUNTER — Other Ambulatory Visit (HOSPITAL_COMMUNITY): Payer: No Typology Code available for payment source

## 2012-08-04 DIAGNOSIS — F419 Anxiety disorder, unspecified: Secondary | ICD-10-CM

## 2012-08-04 DIAGNOSIS — F1220 Cannabis dependence, uncomplicated: Secondary | ICD-10-CM

## 2012-08-04 DIAGNOSIS — Z79899 Other long term (current) drug therapy: Secondary | ICD-10-CM

## 2012-08-04 DIAGNOSIS — F1020 Alcohol dependence, uncomplicated: Principal | ICD-10-CM

## 2012-08-04 DIAGNOSIS — F13939 Sedative, hypnotic or anxiolytic use, unspecified with withdrawal, unspecified: Secondary | ICD-10-CM

## 2012-08-04 DIAGNOSIS — F32A Depression, unspecified: Secondary | ICD-10-CM

## 2012-08-04 DIAGNOSIS — F1310 Sedative, hypnotic or anxiolytic abuse, uncomplicated: Secondary | ICD-10-CM | POA: Diagnosis present

## 2012-08-04 MED ORDER — ADULT MULTIVITAMIN W/MINERALS CH
1.0000 | ORAL_TABLET | Freq: Every day | ORAL | Status: DC
  Administered 2012-08-04 – 2012-08-09 (×6): 1 via ORAL
  Filled 2012-08-04 (×5): qty 1
  Filled 2012-08-04: qty 14
  Filled 2012-08-04 (×2): qty 1

## 2012-08-04 MED ORDER — THIAMINE HCL 100 MG/ML IJ SOLN: 100.0000 mg | Freq: Once | INTRAMUSCULAR | Status: DC

## 2012-08-04 MED ORDER — HYDROXYZINE HCL 50 MG PO TABS
50.0000 mg | ORAL_TABLET | Freq: Every evening | ORAL | Status: DC | PRN
  Administered 2012-08-05 – 2012-08-08 (×4): 50 mg via ORAL
  Filled 2012-08-04: qty 14

## 2012-08-04 MED ORDER — LOPERAMIDE HCL 2 MG PO CAPS: 2.0000 mg | ORAL_CAPSULE | ORAL | Status: AC | PRN

## 2012-08-04 MED ORDER — ONDANSETRON 4 MG PO TBDP: 4.0000 mg | ORAL_TABLET | Freq: Four times a day (QID) | ORAL | Status: AC | PRN

## 2012-08-04 MED ORDER — CHLORDIAZEPOXIDE HCL 25 MG PO CAPS
50.0000 mg | ORAL_CAPSULE | Freq: Once | ORAL | Status: AC
  Administered 2012-08-04: 50 mg via ORAL

## 2012-08-04 MED ORDER — VITAMIN B-1 100 MG PO TABS
100.0000 mg | ORAL_TABLET | Freq: Every day | ORAL | Status: DC
  Administered 2012-08-05 – 2012-08-09 (×5): 100 mg via ORAL
  Filled 2012-08-04 (×7): qty 1

## 2012-08-04 MED ORDER — GABAPENTIN 100 MG PO CAPS
100.0000 mg | ORAL_CAPSULE | Freq: Three times a day (TID) | ORAL | Status: DC
  Administered 2012-08-04 – 2012-08-09 (×16): 100 mg via ORAL
  Filled 2012-08-04 (×4): qty 1
  Filled 2012-08-04 (×2): qty 42
  Filled 2012-08-04 (×3): qty 1
  Filled 2012-08-04: qty 42
  Filled 2012-08-04 (×13): qty 1

## 2012-08-04 MED ORDER — ESCITALOPRAM OXALATE 10 MG PO TABS
10.0000 mg | ORAL_TABLET | Freq: Every day | ORAL | Status: DC
  Administered 2012-08-04 – 2012-08-05 (×2): 10 mg via ORAL
  Filled 2012-08-04 (×5): qty 1

## 2012-08-04 MED ORDER — DM-GUAIFENESIN ER 30-600 MG PO TB12
1.0000 | ORAL_TABLET | Freq: Two times a day (BID) | ORAL | Status: DC
  Administered 2012-08-04 – 2012-08-05 (×3): 1 via ORAL
  Administered 2012-08-06 (×2): via ORAL
  Administered 2012-08-07 – 2012-08-09 (×5): 1 via ORAL
  Filled 2012-08-04 (×14): qty 1

## 2012-08-04 MED ORDER — TRAZODONE HCL 100 MG PO TABS
100.0000 mg | ORAL_TABLET | Freq: Every day | ORAL | Status: DC
  Administered 2012-08-04 – 2012-08-08 (×5): 100 mg via ORAL
  Filled 2012-08-04 (×2): qty 1
  Filled 2012-08-04: qty 14
  Filled 2012-08-04 (×5): qty 1

## 2012-08-04 MED ORDER — ALUM & MAG HYDROXIDE-SIMETH 200-200-20 MG/5ML PO SUSP: 30.0000 mL | ORAL | Status: DC | PRN

## 2012-08-04 MED ORDER — CHLORDIAZEPOXIDE HCL 25 MG PO CAPS
ORAL_CAPSULE | ORAL | Status: AC
  Filled 2012-08-04: qty 2

## 2012-08-04 MED ORDER — CHLORDIAZEPOXIDE HCL 25 MG PO CAPS: 25.0000 mg | ORAL_CAPSULE | Freq: Four times a day (QID) | ORAL | Status: AC | PRN

## 2012-08-04 MED ORDER — IBUPROFEN 600 MG PO TABS: 600.0000 mg | ORAL_TABLET | Freq: Three times a day (TID) | ORAL | Status: DC | PRN

## 2012-08-04 MED ORDER — MAGNESIUM HYDROXIDE 400 MG/5ML PO SUSP
30.0000 mL | Freq: Every day | ORAL | Status: DC | PRN
  Administered 2012-08-05: 30 mL via ORAL

## 2012-08-04 MED ORDER — ADULT MULTIVITAMIN W/MINERALS CH: 1.0000 | ORAL_TABLET | Freq: Every day | ORAL | Status: DC

## 2012-08-04 MED ORDER — NICOTINE 21 MG/24HR TD PT24
21.0000 mg | MEDICATED_PATCH | Freq: Every day | TRANSDERMAL | Status: DC
  Administered 2012-08-04 – 2012-08-09 (×6): 21 mg via TRANSDERMAL
  Filled 2012-08-04: qty 14
  Filled 2012-08-04 (×8): qty 1

## 2012-08-04 MED ORDER — HYDROXYZINE HCL 25 MG PO TABS
25.0000 mg | ORAL_TABLET | Freq: Four times a day (QID) | ORAL | Status: AC | PRN
  Administered 2012-08-04: 25 mg via ORAL

## 2012-08-04 MED ORDER — PANTOPRAZOLE SODIUM 40 MG PO TBEC
40.0000 mg | DELAYED_RELEASE_TABLET | Freq: Every day | ORAL | Status: DC
  Administered 2012-08-04 – 2012-08-09 (×6): 40 mg via ORAL
  Filled 2012-08-04 (×9): qty 1

## 2012-08-04 MED ORDER — ACETAMINOPHEN 325 MG PO TABS
650.0000 mg | ORAL_TABLET | Freq: Four times a day (QID) | ORAL | Status: DC | PRN
Start: 2012-08-04 — End: 2012-08-09
  Administered 2012-08-04 – 2012-08-05 (×2): 650 mg via ORAL
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
## 2012-08-04 NOTE — Progress Notes (Signed)
[facility] LCSW Aftercare Discharge Planning Group Note  08/04/2012 9:59 AM  Participation Quality: Did not attend   Leonard Hendler, Ledell Peoples 08/04/2012, 9:59 AM
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
## 2012-08-04 NOTE — Progress Notes (Signed)
Adult Psychoeducational Group Note  Date:  08/04/2012 Time:  9:42 PM  Group Topic/Focus:  AA group  Participation Level:  Active  Participation Quality:  Appropriate  Affect:  Appropriate  Cognitive:  Alert  Insight: Appropriate  Engagement in Group:  Improving  Modes of Intervention:  Support  Additional Comments:    Flonnie Hailstone 08/04/2012, 9:42 PM

## 2012-08-04 NOTE — Progress Notes (Signed)
Patient ID: Karen Casey, female   DOB: 07/04/1955, 57 y.o.   MRN: [id number] D. The patient has an anxious mood and affect. She is pleasant and interacting appropriately in the milieu. Complained of cold symptoms. A. Met with patient. Assessed for withdrawal symptoms. Encouraged to attend evening substance abuse group. Administered medications. R. The patient attended and actively participated in evening group. Stated the group was very good and she enjoyed participating.

## 2012-08-04 NOTE — BH Assessment (Signed)
Pt is a 57 year old female admitted for detox from ETOH   Pt denies suicidal and homicidal ideation  She denies auditory or visual hallucinations  She drinks approx 8 to 10 beers daily and has lost her home and job due to her drinking  Pt medical problems include blindness in right eye and gerd  She also has a history of withdrawal seizures from xanax  She denies any withdrawal symptoms at present but said she would not be able to go back to sleep without medications  She was just here 2 months ago for detox from xanax    She has a non productive cough and upper respiratory symptoms  Pt was oriented to the unit and offered nourishment  She is on Q 15 min checks   She received medications according to her orders   See her mar   Pt is adjusting well

## 2012-08-04 NOTE — Clinical Social Work Note (Signed)
[facility] Group Notes:  (Counselor/Nursing/MHT/Case Management/Adjunct)  04/28/2012 12:00 PM  Type of Therapy:  Group Therapy  Participation Level:  Did Not Attend  Karen Casey N 04/28/2012, 12:00 PM  

## 2012-08-04 NOTE — [facility] Suicide Risk Assessment (Signed)
Suicide Risk Assessment  Admission Assessment     Nursing information obtained from:  Patient Demographic factors:  Caucasian Current Mental Status:  NA Loss Factors:  Financial problems / change in socioeconomic status Historical Factors:  Family history of mental illness or substance abuse Risk Reduction Factors:  Sense of responsibility to family  CLINICAL FACTORS:   Depression:   Comorbid alcohol abuse/dependence Alcohol/Substance Abuse/Dependencies  COGNITIVE FEATURES THAT CONTRIBUTE TO RISK:  Closed-mindedness Thought constriction (tunnel vision)    SUICIDE RISK:   Minimal: No identifiable suicidal ideation.  Patients presenting with no risk factors but with morbid ruminations; may be classified as minimal risk based on the severity of the depressive symptoms  PLAN OF CARE: Supportive approach/coping skills/relapse prevention                               Detox with Librium                               Reassess co morbidities.   I certify that inpatient services furnished can reasonably be expected to improve the patient's condition.  Scott Fix A 08/04/2012, 12:53 PM

## 2012-08-04 NOTE — ED Notes (Signed)
Patient to discharge with hospital security with written and verbal instructions.

## 2012-08-04 NOTE — Progress Notes (Signed)
D: Patient denies SI/HI and A/V hallucinations; patient reports sleep is poor; reports appetite is poor  ; reports energy level is low  ; reports ability to pay attention is poor; rates depression as 8/10; rates hopelessness 2/10; rates anxiety as 3/10; complaints of runny nose and cough but it has not been observed by Clinical research associate  A: Monitored q 15 minutes; patient encouraged to attend groups; patient educated about medications; patient given medications per physician orders; patient encouraged to express feelings and/or concerns  R: Patient is cooperative but depressed; patient has been observed drinking fluids and eating the snacks on the unit  patient's interaction with staff and peers is appropriate; patient is taking medications as prescribed and tolerating medications; patient is attending all groups

## 2012-08-04 NOTE — Tx Team (Signed)
Initial Interdisciplinary Treatment Plan  PATIENT STRENGTHS: (choose at least two) Capable of independent living General fund of knowledge  PATIENT STRESSORS: Financial difficulties Medication change or noncompliance   PROBLEM LIST: Problem List/Patient Goals Date to be addressed Date deferred Reason deferred Estimated date of resolution  Substance abuse                                                       DISCHARGE CRITERIA:  Ability to meet basic life and health needs Improved stabilization in mood, thinking, and/or behavior Verbal commitment to aftercare and medication compliance  PRELIMINARY DISCHARGE PLAN: Attend 12-step recovery group Return to previous living arrangement  PATIENT/FAMIILY INVOLVEMENT: This treatment plan has been presented to and reviewed with the patient, [name], and/or family member,.  The patient and family have been given the opportunity to ask questions and make suggestions.  Andrena Mews 08/04/2012, 3:42 AM

## 2012-08-04 NOTE — [facility] Counselor (Signed)
Patient reviewed and accepted by Verne Spurr, PA to Dr. Geoffery Lyons and will go into 304-1.

## 2012-08-05 DIAGNOSIS — F411 Generalized anxiety disorder: Secondary | ICD-10-CM

## 2012-08-05 MED ORDER — CITALOPRAM HYDROBROMIDE 10 MG/5ML PO SOLN: 20.0000 mg | Freq: Every day | ORAL | Status: DC

## 2012-08-05 MED ORDER — CITALOPRAM HYDROBROMIDE 20 MG PO TABS
20.0000 mg | ORAL_TABLET | Freq: Every day | ORAL | Status: DC
  Administered 2012-08-05 – 2012-08-08 (×4): 20 mg via ORAL
  Filled 2012-08-05 (×3): qty 1
  Filled 2012-08-05: qty 14
  Filled 2012-08-05 (×3): qty 1

## 2012-08-05 NOTE — Clinical Social Work Note (Signed)
[facility] Group Notes:  (Clinical Social Work)  08/05/2012     10-11AM  Summary of Progress/Problems:   The main focus of today's process group was for the patient to identify ways in which they have in the past sabotaged their own recovery. Motivational Interviewing was utilized to ask the group members what they get out of their substance use, and what they want to change.  The Stages of Change were explained, and members identified where they currently are with regard to stages of change.  The patient expressed that her motivation level is at 7 out of 10, and she is in the preparation stage of change.  She did not elaborate much, but did say she relapsed after only 3 weeks sober.  She drinks to escape her problems, but they are there and worse when she wakes up.  She likes herself while drinking, however, because she is an extremely shy person when not drinking.  We identified this as a barrier to confront and plan for.  Type of Therapy:  Group Therapy - Process   Participation Level:  Active  Participation Quality:  Attentive and Sharing  Affect:  Appropriate, Blunted and Depressed  Cognitive:  Oriented  Insight:  Engaged  Engagement in Therapy:  Engaged  Modes of Intervention:  Education, Teacher, English as a foreign language, Exploration, Discussion, Motivational Interviewing   Ambrose Mantle, LCSW 08/05/2012, 12:23 PM

## 2012-08-05 NOTE — Progress Notes (Signed)
Adult Psychoeducational Group Note  Date:  08/05/2012 Time:  0910  Group Topic/Focus:  Goals Group:   The focus of this group is to help patients establish daily goals to achieve during treatment and discuss how the patient can incorporate goal setting into their daily lives to aide in recovery.  Participation Level:  Active  Participation Quality:  Appropriate, Sharing and Supportive  Affect:  Appropriate  Cognitive:  Alert  Insight: Improving  Engagement in Group:  Engaged and Supportive  Modes of Intervention:  Clarification, Discussion, Education, Exploration and Support  Additional Comments:    Arturo Morton 08/05/2012, 10:49 AM

## 2012-08-05 NOTE — Progress Notes (Signed)
[facility] MD Progress Note  08/05/2012 2:44 PM Karen Casey  MRN:  [id number] Subjective:  Karen Casey reports that she is remorseful over her relapse. She expresses some depression, as well as some anxiety. She reports she is not sleeping well, and they did not give her the Vistaril last night when she requested. She reports that her appetite is okay. She denies any cravings for alcohol, but endorses some mild tremors.  Diagnosis:   Axis I: Alcohol dependence Axis II: Deferred Axis III:  Past Medical History  Diagnosis Date  . GERD (gastroesophageal reflux disease)   . Anxiety   . Depression   . Cataract 06/09/2012    Right eye  . Urinary incontinence 06/09/2012  . 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    Event occurred in 2007  . Alcohol abuse   . Seizures     xanax withdrawl    ADL's:  Intact  Sleep: Fair  Appetite:  Good  Suicidal Ideation:  Patient denies any thought, plan, or intent Homicidal Ideation:  Patient denies any thought, plan, or intent AEB (as evidenced by):  Psychiatric Specialty Exam: Review of Systems  Constitutional: Negative.   HENT: Negative.   Eyes: Negative.   Respiratory: Negative.   Cardiovascular: Negative.   Gastrointestinal: Negative.   Genitourinary: Negative.   Musculoskeletal: Negative.   Skin: Negative.   Neurological: Positive for tremors.  Endo/Heme/Allergies: Negative.   Psychiatric/Behavioral: Positive for depression and substance abuse. The patient is nervous/anxious and has insomnia.     Blood pressure 143/94, pulse 91, temperature 98.5 F (36.9 C), temperature source Oral, resp. rate 16, height 5\' 5"  (1.651 m), weight 65.772 kg (145 lb), SpO2 96.00%.Body mass index is 24.13 kg/(m^2).  General Appearance: Casual  Eye Contact::  Good  Speech:  Clear and Coherent  Volume:  Normal  Mood:  Anxious and Dysphoric  Affect:  Congruent  Thought Process:  Linear  Orientation:  Full (Time, Place, and Person)  Thought Content:  WDL  Suicidal  Thoughts:  No  Homicidal Thoughts:  No  Memory:  Immediate;   Good Recent;   Good Remote;   Good  Judgement:  Fair  Insight:  Fair  Psychomotor Activity:  Normal  Concentration:  Good  Recall:  Good  Akathisia:  No  Handed:  Right  AIMS (if indicated):     Assets:  Communication Skills Desire for Improvement  Sleep:  Number of Hours: 6.75   Current Medications: Current Facility-Administered Medications  Medication Dose Route Frequency Provider Last Rate Last Dose  . acetaminophen (TYLENOL) tablet 650 mg  650 mg Oral Q6H PRN Verne Spurr, PA-C   650 mg at 08/05/12 4098  . alum & mag hydroxide-simeth (MAALOX/MYLANTA) 200-200-20 MG/5ML suspension 30 mL  30 mL Oral Q4H PRN Verne Spurr, PA-C      . chlordiazePOXIDE (LIBRIUM) capsule 25 mg  25 mg Oral Q6H PRN Verne Spurr, PA-C      . dextromethorphan-guaiFENesin (MUCINEX DM) 30-600 MG per 12 hr tablet 1 tablet  1 tablet Oral BID Sanjuana Kava, NP   1 tablet at 08/05/12 [id number]  . escitalopram (LEXAPRO) tablet 10 mg  10 mg Oral Daily Verne Spurr, PA-C   10 mg at 08/05/12 4782  . gabapentin (NEURONTIN) capsule 100 mg  100 mg Oral TID Sanjuana Kava, NP   100 mg at 08/05/12 1304  . hydrOXYzine (ATARAX/VISTARIL) tablet 25 mg  25 mg Oral Q6H PRN Verne Spurr, PA-C   25 mg at 08/04/12 0321  . hydrOXYzine (  ATARAX/VISTARIL) tablet 50 mg  50 mg Oral QHS PRN Verne Spurr, PA-C      . ibuprofen (ADVIL,MOTRIN) tablet 600 mg  600 mg Oral Q8H PRN Verne Spurr, PA-C      . loperamide (IMODIUM) capsule 2-4 mg  2-4 mg Oral PRN Verne Spurr, PA-C      . magnesium hydroxide (MILK OF MAGNESIA) suspension 30 mL  30 mL Oral Daily PRN Verne Spurr, PA-C      . multivitamin with minerals tablet 1 tablet  1 tablet Oral Daily Verne Spurr, PA-C   1 tablet at 08/05/12 4540  . nicotine (NICODERM CQ - dosed in mg/24 hours) patch 21 mg  21 mg Transdermal Q0600 Verne Spurr, PA-C   21 mg at 08/05/12 9811  . ondansetron (ZOFRAN-ODT) disintegrating tablet 4 mg   4 mg Oral Q6H PRN Verne Spurr, PA-C      . pantoprazole (PROTONIX) EC tablet 40 mg  40 mg Oral Daily Verne Spurr, PA-C   40 mg at 08/05/12 9147  . thiamine (B-1) injection 100 mg  100 mg Intramuscular Once [employer], PA-C      . thiamine (VITAMIN B-1) tablet 100 mg  100 mg Oral Daily Verne Spurr, PA-C   100 mg at 08/05/12 8295  . traZODone (DESYREL) tablet 100 mg  100 mg Oral QHS Verne Spurr, PA-C   100 mg at 08/04/12 2159    Lab Results:  Results for orders placed during the hospital encounter of 08/03/12 (from the past 48 hour(s))  CBC WITH DIFFERENTIAL     Status: None   Collection Time    08/03/12  6:46 PM      Result Value Range   WBC 8.4  4.0 - 10.5 K/uL   RBC 4.44  3.87 - 5.11 MIL/uL   Hemoglobin 14.2  12.0 - 15.0 g/dL   HCT 62.1  30.8 - 65.7 %   MCV 93.7  78.0 - 100.0 fL   MCH 32.0  26.0 - 34.0 pg   MCHC 34.1  30.0 - 36.0 g/dL   RDW 84.6  96.2 - 95.2 %   Platelets 209  150 - 400 K/uL   Neutrophils Relative 71  43 - 77 %   Neutro Abs 5.9  1.7 - 7.7 K/uL   Lymphocytes Relative 20  12 - 46 %   Lymphs Abs 1.6  0.7 - 4.0 K/uL   Monocytes Relative 9  3 - 12 %   Monocytes Absolute 0.7  0.1 - 1.0 K/uL   Eosinophils Relative 1  0 - 5 %   Eosinophils Absolute 0.1  0.0 - 0.7 K/uL   Basophils Relative 0  0 - 1 %   Basophils Absolute 0.0  0.0 - 0.1 K/uL  COMPREHENSIVE METABOLIC PANEL     Status: Abnormal   Collection Time    08/03/12  6:46 PM      Result Value Range   Sodium 135  135 - 145 mEq/L   Potassium 3.7  3.5 - 5.1 mEq/L   Chloride 98  96 - 112 mEq/L   CO2 25  19 - 32 mEq/L   Glucose, Bld 93  70 - 99 mg/dL   BUN 11  6 - 23 mg/dL   Creatinine, Ser 8.41  0.50 - 1.10 mg/dL   Calcium 8.8  8.4 - 32.4 mg/dL   Total Protein 6.8  6.0 - 8.3 g/dL   Albumin 3.5  3.5 - 5.2 g/dL   AST 17  0 - 37  U/L   ALT 12  0 - 35 U/L   Alkaline Phosphatase 97  39 - 117 U/L   Total Bilirubin 0.2 (*) 0.3 - 1.2 mg/dL   GFR calc non Af Amer >90  >90 mL/min   GFR calc Af Amer >90   >90 mL/min   Comment:            The eGFR has been calculated     using the CKD EPI equation.     This calculation has not been     validated in all clinical     situations.     eGFR's persistently     <90 mL/min signify     possible Chronic Kidney Disease.  ETHANOL     Status: Abnormal   Collection Time    08/03/12  6:46 PM      Result Value Range   Alcohol, Ethyl (B) 194 (*) 0 - 11 mg/dL   Comment:            LOWEST DETECTABLE LIMIT FOR     SERUM ALCOHOL IS 11 mg/dL     FOR MEDICAL PURPOSES ONLY  URINE RAPID DRUG SCREEN (HOSP PERFORMED)     Status: None   Collection Time    08/03/12  7:00 PM      Result Value Range   Opiates NONE DETECTED  NONE DETECTED   Cocaine NONE DETECTED  NONE DETECTED   Benzodiazepines NONE DETECTED  NONE DETECTED   Amphetamines NONE DETECTED  NONE DETECTED   Tetrahydrocannabinol NONE DETECTED  NONE DETECTED   Barbiturates NONE DETECTED  NONE DETECTED   Comment:            DRUG SCREEN FOR MEDICAL PURPOSES     ONLY.  IF CONFIRMATION IS NEEDED     FOR ANY PURPOSE, NOTIFY LAB     WITHIN 5 DAYS.                LOWEST DETECTABLE LIMITS     FOR URINE DRUG SCREEN     Drug Class       Cutoff (ng/mL)     Amphetamine      1000     Barbiturate      200     Benzodiazepine   200     Tricyclics       300     Opiates          300     Cocaine          300     THC              50  URINALYSIS, ROUTINE W REFLEX MICROSCOPIC     Status: None   Collection Time    08/03/12  7:00 PM      Result Value Range   Color, Urine YELLOW  YELLOW   APPearance CLEAR  CLEAR   Specific Gravity, Urine 1.005  1.005 - 1.030   pH 6.0  5.0 - 8.0   Glucose, UA NEGATIVE  NEGATIVE mg/dL   Hgb urine dipstick NEGATIVE  NEGATIVE   Bilirubin Urine NEGATIVE  NEGATIVE   Ketones, ur NEGATIVE  NEGATIVE mg/dL   Protein, ur NEGATIVE  NEGATIVE mg/dL   Urobilinogen, UA 0.2  0.0 - 1.0 mg/dL   Nitrite NEGATIVE  NEGATIVE   Leukocytes, UA NEGATIVE  NEGATIVE   Comment: MICROSCOPIC NOT DONE ON  URINES WITH NEGATIVE PROTEIN, BLOOD, LEUKOCYTES, NITRITE, OR GLUCOSE <1000 mg/dL.    Physical Findings:  AIMS: Facial and Oral Movements Muscles of Facial Expression: None, normal Lips and Perioral Area: None, normal Jaw: None, normal Tongue: None, normal,Extremity Movements Upper (arms, wrists, hands, fingers): None, normal Lower (legs, knees, ankles, toes): None, normal, Trunk Movements Neck, shoulders, hips: None, normal, Overall Severity Severity of abnormal movements (highest score from questions above): None, normal Incapacitation due to abnormal movements: None, normal Patient's awareness of abnormal movements (rate only patient's report): No Awareness, Dental Status Current problems with teeth and/or dentures?: No Does patient usually wear dentures?: No  CIWA:  CIWA-Ar Total: 0 COWS:  COWS Total Score: 1  Treatment Plan Summary: Daily contact with patient to assess and evaluate symptoms and progress in treatment Medication management, we will change her Lexapro to Celexa as she was unable to afford Lexapro in the past, and Celexa may help her to sleep better.  Plan:  Medical Decision Making Problem Points:  Established problem, stable/improving (1), Review of last therapy session (1) and Review of psycho-social stressors (1) Data Points:  Review of medication regiment & side effects (2)  I certify that inpatient services furnished can reasonably be expected to improve the patient's condition.   Maria Gallicchio 08/05/2012, 2:44 PM

## 2012-08-05 NOTE — Research (Signed)
[name] and cooperative. Pt.  Attending groups.  Denies SI/HI and denies A/V hallucinations.  Pt. Has a productive cough but reports feeling better.  Denies pain.  PRN given for constipation. A.  Encouragement and support given.  Fluids encouraged. R.  Pt. Remains safe.  Will continue to assess.

## 2012-08-05 NOTE — Progress Notes (Signed)
Date: 08/05/2012  Time: 1515  Group Topic/Focus:  Healthy Communication: The focus of this group is to discuss communication, barriers to communication, as well as healthy ways to communicate with others.  Participation Level: Did Not Attend  Participation Quality:  Affect:  Cognitive:  Insight:  Engagement in Group:  Modes of Intervention:  Additional Comments: Pt was sleeping, refused to attend.  Sondra Come  08/05/2012, 4:38 PM

## 2012-08-05 NOTE — Progress Notes (Signed)
Patient did attend the evening speaker AA meeting.  

## 2012-08-05 NOTE — Progress Notes (Addendum)
D: Appears flat and depressed. Calm and cooperative with assessment. Open and spontaneous in conversation. No acute distress. States she feels somewhat better. States she had some nightmares last night and was glad to finally wake up and begin her day. States she feels like she has been battling some cold symptoms and has had some pain in her chest from coughing. She rated her Depression an 8 and her hopelessness a 9. Discussed some her stressors. Denies SI/HI/AVH and contracts for safety.  A: Support and encouragement provided. Safety has been maintained with Q15 min observation. Medications and POC reviewed and understanding verbalized. Meds given as ordered by MD. Encouraged to continue group attendance/participation. PRN provided for pain  R: Remains safe. She is calm and cooperative. She is interacting well with peers and attending groups. Se offers no questions or concerns. Will f/u response to prn, continue Q15 minute observation and continue current POC

## 2012-08-06 NOTE — Clinical Social Work Note (Signed)
[facility] Group Notes:  (Clinical Social Work)  08/06/2012  10:00-11:00AM  Summary of Progress/Problems:   The main focus of today's process group was to define "support" and describe what healthy supports are, then to identify the patient's current support system and decide on other supports that can be put in place to prevent future hospitalizations.   Handouts were used.  Scaling questions were asked to determine patients' level of motivation to seek support and confidence in doing so (1 lowest - 10 highest). The patient expressed that her motivation to seek healthy supports at discharge is 9, but her confidence is 6.  She really wants to reestablish some friendships lost through her alcohol use and has a specific plan to do this.  Type of Therapy:  Process Group with Motivational Interviewing  Participation Level:  Active  Participation Quality:  Attentive, Sharing and Supportive  Affect:  Blunted  Cognitive:  Appropriate and Oriented  Insight:  Engaged  Engagement in Therapy:  Engaged  Modes of Intervention:  Clarification, Education, Limit-setting, Problem-solving, Socialization, Support and Processing, Exploration, Discussion, Role-Play   Ambrose Mantle, LCSW 08/06/2012, 12:06 PM

## 2012-08-06 NOTE — ED Provider Notes (Signed)
Medical screening examination/treatment/procedure(s) were performed by non-physician practitioner and as supervising physician I was immediately available for consultation/collaboration.  Layana Konkel R. Keinan Brouillet, MD 08/06/12 2248 

## 2012-08-06 NOTE — Progress Notes (Signed)
Patient ID: Karen Casey, female   DOB: [birth year], 57 y.o.   MRN: [id number] D)  Has been pleasant, cooperative, was out to the dayroom earlier and attended group.  Had gotten a snack and had already gone to bed, but got up and came out for hs meds.  Denies any thoughts of self harm, but states still has a few thoughts about beer and craves one now and then.  States plan is to go back to work and attend IOP with Daneil Dolin.   A)  Will continue to monitor q 15 minutes for safety, continue POC, support. R)  Safety maintained.

## 2012-08-06 NOTE — Progress Notes (Signed)
[facility] MD Progress Note  08/06/2012 11:54 AM Karen Casey  MRN:  [id number] Subjective:  Karen Casey reports that she is doing very well today. She slept well last night, and her appetite is good. She denies any withdrawal symptoms. She reports that she has thought about drinking a couple times, but denies any true cravings. She rates her depression as a 2 on a scale of 1-10 where 10 is the highest. She rates her anxiety as a 2-3 on a scale of 1-10. She denies any suicidal or homicidal ideation. She denies any auditory or visual hallucinations Diagnosis:   Axis I: Alcohol dependence  Axis II: Deferred Axis III:  Past Medical History  Diagnosis Date  . GERD (gastroesophageal reflux disease)   . Anxiety   . Depression   . Cataract 06/09/2012    Right eye  . Urinary incontinence 06/09/2012  . 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    Event occurred in 2007  . Alcohol abuse   . Seizures     xanax withdrawl    ADL's:  Intact  Sleep: Good  Appetite:  Good  Suicidal Ideation:  She denies thought, plan, or intent Homicidal Ideation:  Patient denies thought, plan, or intent AEB (as evidenced by):  Psychiatric Specialty Exam: Review of Systems  Constitutional: Negative.   HENT: Negative.   Eyes: Negative.   Respiratory: Negative.   Cardiovascular: Negative.   Gastrointestinal: Negative.   Genitourinary: Negative.   Musculoskeletal: Negative.   Skin: Negative.   Neurological: Negative.   Endo/Heme/Allergies: Negative.   Psychiatric/Behavioral: Positive for depression and substance abuse. Negative for suicidal ideas and hallucinations. The patient is nervous/anxious. The patient does not have insomnia.     Blood pressure 128/91, pulse 81, temperature 97 F (36.1 C), temperature source Oral, resp. rate 18, height 5\' 5"  (1.651 m), weight 65.772 kg (145 lb), SpO2 96.00%.Body mass index is 24.13 kg/(m^2).  General Appearance: Casual  Eye Contact::  Good  Speech:  Clear and Coherent  Volume:   Normal  Mood:  Dysphoric  Affect:  Congruent  Thought Process:  Linear  Orientation:  Full (Time, Place, and Person)  Thought Content:  WDL  Suicidal Thoughts:  No  Homicidal Thoughts:  No  Memory:  Immediate;   Good Recent;   Good Remote;   Good  Judgement:  Good  Insight:  Fair  Psychomotor Activity:  Normal  Concentration:  Good  Recall:  Good  Akathisia:  No  Handed:  Right  AIMS (if indicated):     Assets:  Communication Skills Desire for Improvement  Sleep:  Number of Hours: 6   Current Medications: Current Facility-Administered Medications  Medication Dose Route Frequency Provider Last Rate Last Dose  . acetaminophen (TYLENOL) tablet 650 mg  650 mg Oral Q6H PRN Verne Spurr, PA-C   650 mg at 08/05/12 1191  . alum & mag hydroxide-simeth (MAALOX/MYLANTA) 200-200-20 MG/5ML suspension 30 mL  30 mL Oral Q4H PRN Verne Spurr, PA-C      . chlordiazePOXIDE (LIBRIUM) capsule 25 mg  25 mg Oral Q6H PRN Verne Spurr, PA-C      . citalopram (CELEXA) tablet 20 mg  20 mg Oral QHS Jorje [employer], PA-C   20 mg at 08/05/12 2141  . dextromethorphan-guaiFENesin (MUCINEX DM) 30-600 MG per 12 hr tablet 1 tablet  1 tablet Oral BID Sanjuana Kava, NP      . gabapentin (NEURONTIN) capsule 100 mg  100 mg Oral TID Sanjuana Kava, NP   100 mg at  08/06/12 0806  . hydrOXYzine (ATARAX/VISTARIL) tablet 25 mg  25 mg Oral Q6H PRN Verne Spurr, PA-C   25 mg at 08/04/12 0321  . hydrOXYzine (ATARAX/VISTARIL) tablet 50 mg  50 mg Oral QHS PRN Verne Spurr, PA-C   50 mg at 08/05/12 2142  . ibuprofen (ADVIL,MOTRIN) tablet 600 mg  600 mg Oral Q8H PRN Verne Spurr, PA-C      . loperamide (IMODIUM) capsule 2-4 mg  2-4 mg Oral PRN Verne Spurr, PA-C      . magnesium hydroxide (MILK OF MAGNESIA) suspension 30 mL  30 mL Oral Daily PRN Verne Spurr, PA-C   30 mL at 08/05/12 1945  . multivitamin with minerals tablet 1 tablet  1 tablet Oral Daily Verne Spurr, PA-C   1 tablet at 08/06/12 0806  . nicotine (NICODERM CQ -  dosed in mg/24 hours) patch 21 mg  21 mg Transdermal Q0600 Verne Spurr, PA-C   21 mg at 08/06/12 1478  . ondansetron (ZOFRAN-ODT) disintegrating tablet 4 mg  4 mg Oral Q6H PRN Verne Spurr, PA-C      . pantoprazole (PROTONIX) EC tablet 40 mg  40 mg Oral Daily Verne Spurr, PA-C   40 mg at 08/06/12 0806  . thiamine (B-1) injection 100 mg  100 mg Intramuscular Once [employer], PA-C      . thiamine (VITAMIN B-1) tablet 100 mg  100 mg Oral Daily Verne Spurr, PA-C   100 mg at 08/06/12 2956  . traZODone (DESYREL) tablet 100 mg  100 mg Oral QHS Verne Spurr, PA-C   100 mg at 08/05/12 2141    Lab Results: No results found for this or any previous visit (from the past 48 hour(s)).  Physical Findings: AIMS: Facial and Oral Movements Muscles of Facial Expression: None, normal Lips and Perioral Area: None, normal Jaw: None, normal Tongue: None, normal,Extremity Movements Upper (arms, wrists, hands, fingers): None, normal Lower (legs, knees, ankles, toes): None, normal, Trunk Movements Neck, shoulders, hips: None, normal, Overall Severity Severity of abnormal movements (highest score from questions above): None, normal Incapacitation due to abnormal movements: None, normal Patient's awareness of abnormal movements (rate only patient's report): No Awareness, Dental Status Current problems with teeth and/or dentures?: No Does patient usually wear dentures?: No  CIWA:  CIWA-Ar Total: 0 COWS:  COWS Total Score: 1  Treatment Plan Summary: Daily contact with patient to assess and evaluate symptoms and progress in treatment Medication management Consider returned to IOP  Plan:  Medical Decision Making Problem Points:  Established problem, stable/improving (1), Review of last therapy session (1) and Review of psycho-social stressors (1) Data Points:  Review of medication regiment & side effects (2)  I certify that inpatient services furnished can reasonably be expected to improve the  patient's condition.   Prabhjot Piscitello 08/06/2012, 11:54 AM

## 2012-08-06 NOTE — Progress Notes (Signed)
Patient ID: Karen Casey, female   DOB: November 22, 1955, 57 y.o.   MRN: [id number]   D: Pt observed sleeping in bed with eyes closed. RR even and unlabored. No distress noted  .  A: Q 15 minute checks were done for safety.  R: safety maintained on unit.

## 2012-08-06 NOTE — Progress Notes (Signed)
D:  Patient's self inventory sheet, patient has fair sleep, poor appetite, low energy level, improving attention span.  Rated depression #6, hopelessness #8.  Has experienced chilling and agitation.  Denied SI.   Has experienced headache in past 24 hours.  Pain goal #7.  After discharge, plans to "go to work, find a new house, attend AA.  Get a home group, go to outpatient treatment."  Denied SI and HI.  Denied A/V hallucinations.  Wants to get rid of her cold. A:  Staff will monitor every 15 minutes for safety.  Emotional support and encouragement given to patient.  Scheduled medications by MD order. R:  Patient denied SI and HI.   Denied A/V hallucinations.  Patient remains safe on unit.

## 2012-08-07 ENCOUNTER — Other Ambulatory Visit (HOSPITAL_COMMUNITY): Payer: No Typology Code available for payment source

## 2012-08-07 ENCOUNTER — Ambulatory Visit: Payer: Self-pay | Admitting: Family Medicine

## 2012-08-07 MED ORDER — POLYETHYLENE GLYCOL 3350 17 G PO PACK
17.0000 g | PACK | Freq: Every day | ORAL | Status: DC
  Administered 2012-08-07 – 2012-08-09 (×3): 17 g via ORAL
  Filled 2012-08-07: qty 1
  Filled 2012-08-07: qty 7
  Filled 2012-08-07 (×3): qty 1
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
## 2012-08-07 NOTE — Tx Team (Signed)
Interdisciplinary Treatment Plan Update (Adult)  Date: 08/07/2012  Time Reviewed: 9:59 AM   Progress in Treatment: Attending groups: Yes Participating in groups: Yes Taking medication as prescribed:  Yes Tolerating medication:  Yes Family/Significant othe contact made: No Patient understands diagnosis: Yes Discussing patient identified problems/goals with staff: Yes Medical problems stabilized or resolved:  Yes Denies suicidal/homicidal ideation: Yes Patient has not harmed self or Others: Yes  New problem(s) identified: None Identified  Discharge Plan or Barriers:  CSW requesting counselor from [facility] CD IOP met with patient to access suitability to return to program  Additional comments: N/A  Reason for Continuation of Hospitalization: Medication stabilization Withdrawal symptoms   Estimated length of stay: 1 day; plan  To discharge tomorrow  For review of initial/current patient goals, please see plan of care.  Attendees: Patient:     Family:     Physician:  Geoffery Lyons 08/07/2012 9:59 AM   Nursing:   Roswell Miners, RN 08/07/2012 9:59 AM   Clinical Social Worker Ronda Fairly 08/07/2012 9:59 AM   Other:  Chinita Greenland, RN 08/07/2012 9:59 AM   Other:  Linton Rump, RN 08/07/2012 9:59 AM   Other:  Olivia Mackie, Psych Intern 08/07/2012 9:59 AM   Other:   08/07/2012 9:59 AM    Scribe for Treatment Team:   Carney Bern, LCSWA  08/07/2012 9:59 AM

## 2012-08-07 NOTE — Progress Notes (Signed)
Recreation Therapy Notes   Date: 02.24.2014  Time: 2:45pm  Location: [address] Day Room   Group Topic/Focus: Animal Assist Activities/Therapy (AAA/T)   Participation Level:  Active   Participation Quality:  Appropriate   Affect:  Appropriate   Cognitive:  Appropriate   Additional Comments: AAA/T group session 02.24.2014 included AAA dog team. Patient with peers visited with dog team. Patient got to pet Abbey, the dog. Patient interacted appropriately with dog team, peers, and LRT.   Amarah Brossman L Aika Brzoska, LRT/CTRS   Dayvian Blixt L 08/07/2012 4:00 PM 

## 2012-08-07 NOTE — Plan of Care (Signed)
Problem: Ineffective individual coping Goal: STG: Patient will participate in after care plan Outcome: Progressing Patient requesting to meet with CD IOP counselor and sharing desire and commitment should she be able to get back in program  Problem: Alteration in mood & ability to function due to Goal: LTG-Patient demonstrates decreased signs of withdrawal (Patient demonstrates decreased signs of withdrawal to the point the patient is safe to return home and continue treatment in an outpatient setting)  Outcome: Progressing Patient reports she is progressing and withdrawal symptoms are decreasing
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
## 2012-08-07 NOTE — Progress Notes (Signed)
Patient did attend the evening speaker AA meeting.  

## 2012-08-08 DIAGNOSIS — F1910 Other psychoactive substance abuse, uncomplicated: Secondary | ICD-10-CM

## 2012-08-08 NOTE — Progress Notes (Signed)
Patient ID: Karen Casey, female   DOB: [birth year], 57 y.o.   MRN: [id number] She has been up and to groups interacting with peers and staff.  Self inventory: depression 3, hopelessness, w/d 's  Craving and chilling.  Denies SI thoughts. Her plan on discharge   Is to go stay with her sister and go to [employer]  She stated that she is feeling a lot better now.

## 2012-08-08 NOTE — Progress Notes (Signed)
Adult Psychoeducational Group Note  Date:  08/08/2012 Time:  1:23 PM  Group Topic/Focus:  Recovery Goals:   The focus of this group is to identify appropriate goals for recovery and establish a plan to achieve them.  Participation Level:  Active  Participation Quality:  Appropriate, Attentive and Sharing  Affect:  Appropriate  Cognitive:  Alert and Appropriate  Insight: Appropriate  Engagement in Group:  Engaged  Modes of Intervention:  Discussion  Additional Comments:  Pt was appropriate and attentive while attending group. Pt shared that drinking no more beer and smoking no more pot is what recovery is to her. Pt also stated that stress and fear is something that stands between him and recovery.   Sharyn Lull 08/08/2012, 1:23 PM
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
## 2012-08-08 NOTE — Progress Notes (Signed)
[facility] LCSW Group Therapy - Late Entry     Type of Therapy:  Group Therapy  Participation Level:  Minimal; was out to meet with another staff member  Participation Quality:  Appropriate  Affect:  Depressed  Cognitive:  Alert and Oriented  Insight:  Limited  Engagement in Therapy:  Improving  Modes of Intervention:  Discussion, Exploration, Rapport Building and Support  Summary of Progress/Problems: Group discussion focused on what patient's see as their own obstacles to recovery.  Patient shares belief that others in family will be difficult to deal with due to her recent experience of getting angry, upset and envious of other's ability to use alcohol.   Clide Dales 08/08/2012, 1:15 PM

## 2012-08-08 NOTE — Progress Notes (Signed)
[facility] MD Progress Note  08/08/2012 1:15 PM Karen Casey  MRN:  [id number] Subjective:  Karen Casey endorses that she does not feel ready to leave today. Admits to feeling very worried, thinking about her possible relapse. She is going to stay with her sister and admits that this is a very stressful setting. She does not have another place to go. She is going to come to the CD IOP at [facility]. She states that she would feel more at ease if she could leave tomorrow morning  and start the CD IOP in the afternoon. She is taking the Celexa. She has tolerated it well so far. Still endorses underlying depressed mood and anxiety Diagnosis:  Major Depression recurrent, GAD  ADL's:  Intact  Sleep: Poor  Appetite:  Fair  Suicidal Ideation:  Plan:  denies Intent:  denies Means:  denies Homicidal Ideation:  Plan:  denies Intent:  denies Means:  denies AEB (as evidenced by):  Psychiatric Specialty Exam: Review of Systems  Constitutional: Negative.   HENT: Negative.   Eyes: Negative.   Respiratory: Negative.   Cardiovascular: Negative.   Gastrointestinal: Negative.   Genitourinary: Negative.   Musculoskeletal: Negative.   Skin: Negative.   Neurological: Negative.   Endo/Heme/Allergies: Negative.   Psychiatric/Behavioral: Positive for depression and substance abuse. The patient is nervous/anxious and has insomnia.     Blood pressure 119/74, pulse 105, temperature 97.8 F (36.6 C), temperature source Oral, resp. rate 18, height 5\' 5"  (1.651 m), weight 65.772 kg (145 lb), SpO2 96.00%.Body mass index is 24.13 kg/(m^2).  General Appearance: Fairly Groomed  Patent attorney::  Fair  Speech:  Clear and Coherent and rapid  Volume:  Normal  Mood:  Anxious and worried  Affect:  anxious  Thought Process:  Coherent and Goal Directed  Orientation:  Full (Time, Place, and Person)  Thought Content:  worries, concerns  Suicidal Thoughts:  No  Homicidal Thoughts:  No  Memory:  Immediate;    Fair Recent;   Fair Remote;   Fair  Judgement:  Fair  Insight:  Present  Psychomotor Activity:  Restlessness  Concentration:  Fair  Recall:  Fair  Akathisia:  No  Handed:  Right  AIMS (if indicated):     Assets:  Desire for Improvement  Sleep:  Number of Hours: 6.5   Current Medications: Current Facility-Administered Medications  Medication Dose Route Frequency Provider Last Rate Last Dose  . acetaminophen (TYLENOL) tablet 650 mg  650 mg Oral Q6H PRN Verne Spurr, PA-C   650 mg at 08/05/12 1478  . alum & mag hydroxide-simeth (MAALOX/MYLANTA) 200-200-20 MG/5ML suspension 30 mL  30 mL Oral Q4H PRN Verne Spurr, PA-C      . citalopram (CELEXA) tablet 20 mg  20 mg Oral QHS Jorje Guild, PA-C   20 mg at 08/07/12 2136  . dextromethorphan-guaiFENesin (MUCINEX DM) 30-600 MG per 12 hr tablet 1 tablet  1 tablet Oral BID Sanjuana Kava, NP   1 tablet at 08/08/12 0808  . gabapentin (NEURONTIN) capsule 100 mg  100 mg Oral TID Sanjuana Kava, NP   100 mg at 08/08/12 1210  . hydrOXYzine (ATARAX/VISTARIL) tablet 50 mg  50 mg Oral QHS PRN Verne Spurr, PA-C   50 mg at 08/07/12 2136  . ibuprofen (ADVIL,MOTRIN) tablet 600 mg  600 mg Oral Q8H PRN Verne Spurr, PA-C      . magnesium hydroxide (MILK OF MAGNESIA) suspension 30 mL  30 mL Oral Daily PRN Verne Spurr, PA-C  30 mL at 08/05/12 1945  . multivitamin with minerals tablet 1 tablet  1 tablet Oral Daily Verne Spurr, PA-C   1 tablet at 08/08/12 0809  . nicotine (NICODERM CQ - dosed in mg/24 hours) patch 21 mg  21 mg Transdermal Q0600 Verne Spurr, PA-C   21 mg at 08/08/12 1610  . pantoprazole (PROTONIX) EC tablet 40 mg  40 mg Oral Daily Verne Spurr, PA-C   40 mg at 08/08/12 9604  . polyethylene glycol (MIRALAX / GLYCOLAX) packet 17 g  17 g Oral Daily Rachael Fee, MD   17 g at 08/08/12 0810  . thiamine (B-1) injection 100 mg  100 mg Intramuscular Once [employer], PA-C      . thiamine (VITAMIN B-1) tablet 100 mg  100 mg Oral Daily Verne Spurr,  PA-C   100 mg at 08/08/12 0809  . traZODone (DESYREL) tablet 100 mg  100 mg Oral QHS Verne Spurr, PA-C   100 mg at 08/07/12 2136    Lab Results: No results found for this or any previous visit (from the past 48 hour(s)).  Physical Findings: AIMS: Facial and Oral Movements Muscles of Facial Expression: None, normal Lips and Perioral Area: None, normal Jaw: None, normal Tongue: None, normal,Extremity Movements Upper (arms, wrists, hands, fingers): None, normal Lower (legs, knees, ankles, toes): None, normal, Trunk Movements Neck, shoulders, hips: None, normal, Overall Severity Severity of abnormal movements (highest score from questions above): None, normal Incapacitation due to abnormal movements: None, normal Patient's awareness of abnormal movements (rate only patient's report): No Awareness, Dental Status Current problems with teeth and/or dentures?: No Does patient usually wear dentures?: No  CIWA:  CIWA-Ar Total: 1 COWS:  COWS Total Score: 1  Treatment Plan Summary: Daily contact with patient to assess and evaluate symptoms and progress in treatment Medication management  Plan: Supportive approach/coping skills/relapse prevention           Continue Celexa 20 mg daily           Will D/C in AM to start the CD IOP in the PM Medical Decision Making Problem Points:  Review of psycho-social stressors (1) Data Points:  Review of medication regiment & side effects (2)  I certify that inpatient services furnished can reasonably be expected to improve the patient's condition.   Jenipher Havel A 08/08/2012, 1:15 PM

## 2012-08-08 NOTE — Progress Notes (Signed)
Pt reports she is having a good day, except she has not had a BM in about 4 days.  MD is aware.  She says her withdrawal symptoms are decreasing.  She denies SI/HI/AV.  She is observed sitting in the dayroom watching TV with peers and interacting appropriately.  Pt makes her needs known to staff.  Pt may discharge home Tuesday.  Support/encouragement given.  Safety maintained with q15 minute checks.
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
## 2012-08-08 NOTE — Progress Notes (Signed)
[facility] LCSW Aftercare Discharge Planning Group Note  08/08/2012 8:15 AM  Participation Quality:  Appropriate  Affect: Anxious and Tearful  Cognitive:  Alert, Appropriate and Oriented  Insight: Improving  Engagement in Group:  Engaged  Modes of Intervention:  Clarification, Discussion, Exploration, Problem-solving and Support  Summary of Progress/Problems: Pt denies suicidal ideation and homicidal ideation.  On a scale of 1 to 10 with ten being the most ever experienced, the patient rates depression at a 3 and anxiety at a 3. [name] shared that she had just spoken to a family which brought up feelings of anger and frustration.  Patient shared how she never gets off the phone with those she loves with out telling them she loves them as she lost her mother to an anyourism and had no warning.  Patient became tearful talking about her parent's deaths that were over 20 years ago which was good intro to discussion of emotions feeling like a roller coaster in early sobriety.    Clide Dales

## 2012-08-09 ENCOUNTER — Other Ambulatory Visit (HOSPITAL_COMMUNITY): Payer: No Typology Code available for payment source

## 2012-08-09 ENCOUNTER — Other Ambulatory Visit (HOSPITAL_COMMUNITY): Payer: No Typology Code available for payment source | Attending: Psychiatry | Admitting: Psychology

## 2012-08-09 DIAGNOSIS — F1020 Alcohol dependence, uncomplicated: Secondary | ICD-10-CM | POA: Insufficient documentation

## 2012-08-09 DIAGNOSIS — F1320 Sedative, hypnotic or anxiolytic dependence, uncomplicated: Secondary | ICD-10-CM

## 2012-08-09 MED ORDER — CITALOPRAM HYDROBROMIDE 20 MG PO TABS: 20.0000 mg | ORAL_TABLET | Freq: Every day | ORAL | Status: DC

## 2012-08-09 MED ORDER — ADULT MULTIVITAMIN W/MINERALS CH: 1.0000 | ORAL_TABLET | Freq: Every day | ORAL | Status: DC

## 2012-08-09 MED ORDER — NICOTINE 21 MG/24HR TD PT24: 1.0000 | MEDICATED_PATCH | Freq: Every day | TRANSDERMAL | Status: DC

## 2012-08-09 MED ORDER — POLYETHYLENE GLYCOL 3350 17 G PO PACK: 17.0000 g | PACK | Freq: Every day | ORAL | Status: DC

## 2012-08-09 MED ORDER — TRAZODONE HCL 100 MG PO TABS: 100.0000 mg | ORAL_TABLET | Freq: Every day | ORAL | Status: DC

## 2012-08-09 MED ORDER — GABAPENTIN 100 MG PO CAPS: 100.0000 mg | ORAL_CAPSULE | Freq: Three times a day (TID) | ORAL | Status: DC

## 2012-08-09 MED ORDER — HYDROXYZINE HCL 50 MG PO TABS: 50.0000 mg | ORAL_TABLET | Freq: Every evening | ORAL | Status: DC | PRN

## 2012-08-09 MED ORDER — OMEPRAZOLE 20 MG PO CPDR: 40.0000 mg | DELAYED_RELEASE_CAPSULE | Freq: Every day | ORAL | Status: DC

## 2012-08-09 NOTE — Progress Notes (Signed)
Patient ID: Karen Casey, female   DOB: October 05, 1955, 57 y.o.   MRN: [id number] She has been up and to groups interacting with peers and staff. Self inventory: depression 3, hopelessness 1, withdrawals craving and agitation,     Denies SI thoughts and c/o headache and blurred vision but has not requested a prn.

## 2012-08-09 NOTE — Progress Notes (Signed)
[facility] INPATIENT:  Family/Significant Other Suicide Prevention Education  Suicide Prevention Education:  Clinical research associate provided suicide prevention education directly to patient at 10:50 AM on 08/09/2012; suicidal ideation was never listed in chart as issue for patient yet there was a note that patient reported SI to [employer] who brought patient to ED  The suicide prevention education provided includes the following:  Suicide risk factors  Suicide prevention and interventions  National Suicide Hotline telephone number  [facility] assessment telephone number  [facility] Emergency Assistance 911  [facility] and/or Residential Mobile Crisis Unit telephone number  Request made of family/significant other to:  Remove weapons (e.g., guns, rifles, knives), all items previously/currently identified as safety concern.  Patient reports there are no firearms in the home of sister or husband.   Remove drugs/medications (over-the-counter, prescriptions, illicit drugs), all items previously/currently identified as a safety concern.  The family member/significant other verbalizes understanding of the suicide prevention education information provided.  The family member/significant other agrees to remove the items of safety concern listed above.  Karen Casey 08/09/2012, 11:03 AM

## 2012-08-09 NOTE — Progress Notes (Signed)
Patient ID: Karen Casey, female   DOB: [birth year], 57 y.o.   MRN: [id number]  D: Pt informed the writer that she feels better physically but not emotionally. Stated that before adm she, "wanted to stay drunk and wanted her xanax". But that today I just wanted a xanax because I was anxious".   A:  Support and encouragement was offered. 15 min checks continued for safety.  R: Pt remains safe.

## 2012-08-09 NOTE — Progress Notes (Signed)
[facility] Adult Case Management Discharge Plan :  Will you be returning to the same living situation after discharge: Yes,  with sister At discharge, do you have transportation home?:Yes,  sister Do you have the ability to pay for your medications:Yes,  at reduced cost at [facility]  Release of information consent forms completed and in the chart;  Patient's signature needed at discharge.  Patient to Follow up at: Follow-up Information   Follow up with [name]. (Walk in between 8am-9am Monday thru Friday for hospital follow-up and to be set up with therapist.)    Contact information:   [address], [place] [address] phone: [phone] fax: [phone]      Follow up with [facility] On 08/09/2012. (CD-IOP Group starts topday, 2/26 at 1:00PM)    Contact information:   [address] [place], [place] [address] [facility] [phone]      Patient denies SI/HI:   Yes,  Denies both    Safety Planning and Suicide Prevention discussed:  Yes,  in group setting  Clide Dales 08/09/2012, 10:44 AM

## 2012-08-09 NOTE — Progress Notes (Signed)
Patient ID: Karen Casey, female   DOB: 1955/07/06, 57 y.o.   MRN: [id number] She has been discharged and is going downstairs to IOP then home today. She voiced understanding of discharge instruction and of follow up. She denies thoughts of SI. All her belonging being taken with her.

## 2012-08-09 NOTE — Progress Notes (Signed)
[facility] LCSW Aftercare Discharge Planning Group Note  08/09/2012 8:35 AM  Participation Quality:  Appropriate  Affect:  Appropriate  Cognitive:  Appropriate  Insight:  Developing/Improving  Engagement in Group:  Engaged  Modes of Intervention:  Clarification, Exploration, Orientation, Socialization and Support  Summary of Progress/Problems: Pt denies suicidal ideation and homicidal ideation.  On a scale of 1 to 10 with ten being the most ever experienced, the patient rates depression at a 4 and anxiety at a 7. Patient reports "I'll always have high anxiety" yet was willing to consider the possibility that the level may decrease. Patient feels ready for discharge today and plans to go straight to CD-IOP downstairs.    Clide Dales 08/09/2012, 8:38 AM

## 2012-08-09 NOTE — [facility] Suicide Risk Assessment (Signed)
Suicide Risk Assessment  Discharge Assessment     Demographic Factors:  Caucasian  Mental Status Per Nursing Assessment::   On Admission:  NA  Current Mental Status by Physician: In full contact with reality. There are no suicidal ideas, plans or intent. She is fully detox. Will be going to the CDIOP at [facility].   Loss Factors: NA  Historical Factors: NA  Risk Reduction Factors:   Sense of responsibility to family, Living with another person, especially a relative and Positive social support  Continued Clinical Symptoms:  Depression:   Comorbid alcohol abuse/dependence Alcohol/Substance Abuse/Dependencies  Cognitive Features That Contribute To Risk:  Closed-mindedness    Suicide Risk:  Minimal: No identifiable suicidal ideation.  Patients presenting with no risk factors but with morbid ruminations; may be classified as minimal risk based on the severity of the depressive symptoms  Discharge Diagnoses:   AXIS I:  Alcohol Abuse, Benzodiazepine abuse, GAD, Depressive Disorder NOS AXIS II:  Deferred AXIS III:   Past Medical History  Diagnosis Date  . GERD (gastroesophageal reflux disease)   . Anxiety   . Depression   . Cataract 06/09/2012    Right eye  . Urinary incontinence 06/09/2012  . 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    Event occurred in 2007  . Alcohol abuse   . Seizures     xanax withdrawl   AXIS IV:  other psychosocial or environmental problems AXIS V:  61-70 mild symptoms  Plan Of Care/Follow-up recommendations:  Activity:  as tolerated Diet:  regular CD IOP at [facility] Is patient on multiple antipsychotic therapies at discharge:  No   Has Patient had three or more failed trials of antipsychotic monotherapy by history:  No  Recommended Plan for Multiple Antipsychotic Therapies: N/A   Maricel Swartzendruber A 08/09/2012, 11:02 AM

## 2012-08-09 NOTE — Discharge Summary (Signed)
Physician Discharge Summary Note  Patient:  Karen Casey is an 57 y.o., female MRN:  [id number] DOB:  1956/01/04 Patient phone:  [phone] (home)  Patient address:   [address] [place] [address],   Date of Admission:  08/04/2012  Date of Discharge: 08/09/12  Reason for Admission: Alcohol and benzodiazepine detoxification.  Discharge Diagnoses: Active Problems:   * No active hospital problems. *  Review of Systems  Constitutional: Negative.   HENT: Negative.   Eyes: Negative.   Respiratory: Negative.   Cardiovascular: Negative.   Gastrointestinal: Negative.   Genitourinary: Negative.   Musculoskeletal: Negative.   Skin: Negative.   Neurological: Negative.   Endo/Heme/Allergies: Negative.   Psychiatric/Behavioral: Positive for depression (Stabilized with medication prior to discharge) and substance abuse. Negative for suicidal ideas and hallucinations. The patient is nervous/anxious (Stabilized with medication prior to discharge) and has insomnia (Stabilized with medication prior to discharge.).    Axis Diagnosis:   AXIS I:   Polysubstance abuse AXIS II:  Deferred AXIS III:   Past Medical History  Diagnosis Date  . GERD (gastroesophageal reflux disease)   . Anxiety   . Depression   . Cataract 06/09/2012    Right eye  . Urinary incontinence 06/09/2012  . 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    Event occurred in 2007  . Alcohol abuse   . Seizures     xanax withdrawl   AXIS IV:  other psychosocial or environmental problems and Substance abuse issues. AXIS V:  64  Level of Care:  OP  Hospital Course:  This is a first admission for Karen Casey who presented to [facility] as a walk-in for detox from alcohol and benzodiazepines. She was sent to [facility] for medical clearance. She reports drinking 2-10 beers a day for 3 months. [name] was taking 6 xanax 2 mg a day that she was getting from her psychiatrist Dr. Evelene Croon. Her last xanax was in October and resulted in her  having a withdrawal seizure. Her last alcohol was 2 days ago, but she had stopped 5 days from Dec 12 -Dec 17th.  Upon admission in this hospital and after admission assessment, it was decided that Karen Casey was intoxicated and will need detoxification treatment to stabilize her body from alcohol intoxication and to combat the withdrawal symptoms of alcohol as well. Karen Casey was then started on Librium protocol for her alcohol detoxification. She was also enrolled in group counseling sessions and activities to learn coping skills that should help her after discharge to cope better and manage her substance abuse issues for a much longer sobriety and abstinence from alcohol. She also attended AA/NA meetings being offered and held on this unit. She does have some previous and or identifiable medical conditions that required treatment and or monitoring. Karen Casey received medication management for all those health issues as well. She was monitored closely for any potential problems that may arise as a result of and or during detoxification treatment. Patient tolerated her treatment regimen and detoxification treatment without any significant adverse effects and or reactions reported.  Patient attended treatment team meeting this am and met with the team. Her reason for admission, symptoms, substance abuse issues, response to to treatment and discharge plans discussed. Patient endorsed that she is doing well and stable for discharge to pursue the next phase of her substance abuse treatment. It was then agreed upon that patient will continue psychiatric care on outpatient basis to maintain stability and sobriety. She will follow-up care at  [name] here in [place], [place] for routine care and medication management. She is instructed and made aware that this appointment with [facility] is a walk-in appointment between the hours of 08:00 am and 12:00 noon Monday thru Friday. And for counseling session services, patient  will be receiving this service from the [facility] CD-IOP starting today 08/09/12 at 1:00 pm. The addresses, dates and times for these appointments provided for patient in writing.   Upon discharge, patient adamantly denies suicidal, homicidal ideations, auditory, visual hallucinations, delusional thinking and or withdrawal symptoms. Patient left [facility] with all personal belongings in no apparent distress. She received 2 weeks worth samples of her discharge medications. Transportation per family.   Consults:  None  Significant Diagnostic Studies:  labs: CBC with diff, CMP, UDS, Toxicology tests.  Discharge Vitals:   Blood pressure 137/84, pulse 91, temperature 97.4 F (36.3 C), temperature source Oral, resp. rate 16, height 5\' 5"  (1.651 m), weight 65.772 kg (145 lb), SpO2 96.00%. Body mass index is 24.13 kg/(m^2). Lab Results:   No results found for this or any previous visit (from the past 72 hour(s)).  Physical Findings: AIMS: Facial and Oral Movements Muscles of Facial Expression: None, normal Lips and Perioral Area: None, normal Jaw: None, normal Tongue: None, normal,Extremity Movements Upper (arms, wrists, hands, fingers): None, normal Lower (legs, knees, ankles, toes): None, normal, Trunk Movements Neck, shoulders, hips: None, normal, Overall Severity Severity of abnormal movements (highest score from questions above): None, normal Incapacitation due to abnormal movements: None, normal Patient's awareness of abnormal movements (rate only patient's report): No Awareness, Dental Status Current problems with teeth and/or dentures?: No Does patient usually wear dentures?: No  CIWA:  CIWA-Ar Total: 0 COWS:  COWS Total Score: 1  Psychiatric Specialty Exam: See Psychiatric Specialty Exam and Suicide Risk Assessment completed by Attending Physician prior to discharge.  Discharge destination:  Home  Is patient on multiple antipsychotic therapies at discharge:  No   Has  Patient had three or more failed trials of antipsychotic monotherapy by history:  No  Recommended Plan for Multiple Antipsychotic Therapies: NA     Medication List    STOP taking these medications       escitalopram 10 MG tablet  Commonly known as:  LEXAPRO      TAKE these medications     Indication   citalopram 20 MG tablet  Commonly known as:  CELEXA  Take 1 tablet (20 mg total) by mouth at bedtime. For depression   Indication:  Depression     gabapentin 100 MG capsule  Commonly known as:  NEURONTIN  Take 1 capsule (100 mg total) by mouth 3 (three) times daily. For anxiety/pain control   Indication:  Agitation, Pain     hydrOXYzine 50 MG tablet  Commonly known as:  ATARAX/VISTARIL  Take 1 tablet (50 mg total) by mouth at bedtime as needed for anxiety. For anxiety/sleep   Indication:  Anxiety associated with Organic Disease     multivitamin with minerals Tabs  Take 1 tablet by mouth daily. For vitamin deficiency   Indication:  Vitamin deficiency     nicotine 21 mg/24hr patch  Commonly known as:  NICODERM CQ - dosed in mg/24 hours  Place 1 patch onto the skin daily at 6 (six) AM. For Nicotine addiction   Indication:  Nicotine Addiction     omeprazole 20 MG capsule  Commonly known as:  PRILOSEC  Take 2 capsules (40 mg total) by mouth daily. For acid refluxFor  acid reflux   Indication:  Gastroesophageal Reflux Disease with Current Symptoms     polyethylene glycol packet  Commonly known as:  MIRALAX / GLYCOLAX  Take 17 g by mouth daily. For constipation (THIS MEDICINE CAN BE BOUGHT OVER THE COUNTER)   Indication:  Constipation, Treatment for the Prevention of Constipation     traZODone 100 MG tablet  Commonly known as:  DESYREL  Take 1 tablet (100 mg total) by mouth at bedtime. For depression/sleep   Indication:  Trouble Sleeping, Major Depressive Disorder       Follow-up Information   Follow up with [name]. (Walk in between 8am-9am Monday thru Friday for  hospital follow-up and to be set up with therapist.)    Contact information:   [address], [place] [address] phone: [phone] fax: [phone]      Follow up with [facility] On 08/09/2012. (CD-IOP Group starts topday, 2/26 at 1:00PM)    Contact information:   [address] [place], [place] [address] [facility] [phone]      Follow-up recommendations:  Activity:  as tolerated Other:  Keep all scheduled follow-up appointments as recommended.  Comments:  Take all your medications as prescribed by your mental healthcare provider. Report any adverse effects and or reactions from your medicines to your outpatient provider promptly. Patient is instructed and cautioned to not engage in alcohol and or illegal drug use while on prescription medicines. In the event of worsening symptoms, patient is instructed to call the crisis hotline, 911 and or go to the nearest ED for appropriate evaluation and treatment of symptoms. Follow-up with your primary care provider for your other medical issues, concerns and or health care needs.  Continue to work the relapse prevention plan Total Discharge Time:  Greater than 30 minutes  Signed: Armandina Stammer I 08/09/2012, 2:50 PM

## 2012-08-10 ENCOUNTER — Encounter (HOSPITAL_COMMUNITY): Payer: Self-pay | Admitting: Psychology

## 2012-08-10 NOTE — Progress Notes (Signed)
    Daily Group Progress Note  Program: CD-IOP   Group Time: 1-2:30 pm  Participation Level: Active  Behavioral Response: Sharing  Type of Therapy: Process Group  Topic: Flight: the first part of the film, "Flight" was shown in the first part of group. Prior to viewing the film, the group checked-in. One member had returned after being absent on Monday. She described what had happened that led her to drink on Saturday afternoon. The relapse was recalled and options other than drinking identified and discussed at length. This member is very new to recovery and hasn't learned many of the tools available instead of alcohol. The group provided good feedback and support to this woman and she was applauded for returning today.   Group Time: 2:45- 4pm  Participation Level: Active  Behavioral Response: Sharing  Type of Therapy: Psycho-education Group  Topic: "Where do I Get Help"/Graduation: second part of group was spent in psycho-ed with members identifying the various sources of support in their lives.  A large map was drawn on the board with circles representing family, friends, work, recreation, spirituality and other parts of one's life. Members were asked to list names of people in each category. While a few had many names from different sources, most had few people to identify because they have no friends or significant sources of support. At the conclusion of the group, a graduation ceremony was held. The coin was passed and members shared their hopes with the graduating group member.   Summary: The patient arrived today directly from discharge after completing an alcohol detox. She had been in the program before, but had left without explanation and continued using. Today she admitted she would do things differently. She reported she is living with her sister for the time being and probably not going back to her S/O of 25+ years. She is also seeking employment. The patient agreed that  she would be 'honest' and the group encouraged her to do her best to remain honest and not hide anything. The patient shared some kind words with the graduating member. She had been in the group for a few sessions before dropping out of treatment and she was happy to see her complete successfully. This patient will be monitored closely in the days ahead and if she isn't doing what we have asked of her, we will sit down with treatment team and discuss what will be done if she is to remain here in the program.   Family Program: Family present? No   Name of family member(s):   UDS collected: No Results:   AA/NA attended?: No, just got out of detox today  Sponsor?: No   Michah Minton, LCAS

## 2012-08-11 ENCOUNTER — Other Ambulatory Visit (HOSPITAL_COMMUNITY): Payer: No Typology Code available for payment source

## 2012-08-11 ENCOUNTER — Other Ambulatory Visit (HOSPITAL_COMMUNITY): Payer: No Typology Code available for payment source | Admitting: Psychology

## 2012-08-11 DIAGNOSIS — F1020 Alcohol dependence, uncomplicated: Secondary | ICD-10-CM

## 2012-08-11 DIAGNOSIS — F1320 Sedative, hypnotic or anxiolytic dependence, uncomplicated: Secondary | ICD-10-CM

## 2012-08-11 NOTE — H&P (Signed)
Agree with assessment and plan Karen Casey A. Karen Casey, M.D. 

## 2012-08-12 ENCOUNTER — Encounter (HOSPITAL_COMMUNITY): Payer: Self-pay | Admitting: Psychology

## 2012-08-12 NOTE — Progress Notes (Signed)
    Daily Group Progress Note  Program: CD-IOP   Group Time: 1-2:30 pm  Participation Level: Minimal  Behavioral Response: Sharing  Type of Therapy: Process Group  Topic: Group Process: first part of group included check-in and members describing what they had done since the last session to support their recovery. A number of the new members had secured sponsors and their efforts were applauded. Two members had not attended any meetings and the reasons for their hesitancy were explored. One member provided nothing but excuse after excuse and the group seemed almost tired with her resistance. During the session, the medical director met with the newest group member as well as group member who were struggling with sleep or other issues.   Group Time: 2:45-4 pm  Participation Level: Minimal  Behavioral Response: Sharing  Type of Therapy: Psycho-education Group  Topic: Fears around Honesty: second half of group was spent in a psycho-ed session exploring the reasons why being honest is so difficult. One member, who had identified how hard it was for her to be honest, noted that she doesn't want to be honest because people might not like her if they know what she is really like. Other members concurred with this explanation. This invited a brief presentation on the power of core beliefs and how, for many people, negative core beliefs developed early in their lives continue to play out in adulthood. Almost all people struggling with CD display a poor self-concept. Every group member agreed that they had kept their true selves hidden out of fear of being 'discovered'. Addressing negative core beliefs will occur in both individual and group therapy sessions going forward. Identifying these deep negative beliefs and then learning how to change them will be an issue that will receive considerable attention throughout the program.    Summary: the patient reported she attended an AA meeting yesterday  at the [facility]. She reported there had been a lot of women and she had been greeted warmly. She showed the group she had gotten a starter chip. She reported she will be looking for a job and is staying with her sister and her husband for now. The patient was attentive, but shared little about herself. She has admitted in previous sessions that she has very low self-esteem. She just got our of detox and may still be a little sedated by the lithium. She reported she would attend the 11 am women's meeting where a number of other women in the group go on Saturdays and will be in church on Sunday. We will follow closely to insure that this patient gets engaged in the [employer]. She has repeated her intention to be honest and apply herself fully to her recovery this time.    Family Program: Family present? No   Name of family member(s):   UDS collected: No Results:   AA/NA attended?: YesThursday  Sponsor?: No, but she just started back in the program   Otila Starn, LCAS

## 2012-08-14 ENCOUNTER — Other Ambulatory Visit (HOSPITAL_COMMUNITY): Payer: No Typology Code available for payment source | Attending: Psychiatry

## 2012-08-14 ENCOUNTER — Other Ambulatory Visit (HOSPITAL_COMMUNITY): Payer: No Typology Code available for payment source

## 2012-08-14 NOTE — Progress Notes (Addendum)
Patient Discharge Instructions:  After Visit Summary (AVS):   Faxed to:  08/14/12 Discharge Summary Note:   Faxed to:  08/14/12 Psychiatric Admission Assessment Note:   Faxed to:  08/14/12 Suicide Risk Assessment - Discharge Assessment:   Faxed to:  08/14/12 Faxed/Sent to the Next Level Care provider:  08/14/12 Next Level Care Provider Has Access to the EMR, 08/14/12 Faxed to [facility] @ [phone] Records provided to [facility] via [facility]/Epic access  Jerelene Redden, 08/14/2012, 4:03 PM

## 2012-08-16 ENCOUNTER — Other Ambulatory Visit (HOSPITAL_COMMUNITY): Payer: No Typology Code available for payment source

## 2012-08-18 ENCOUNTER — Other Ambulatory Visit (HOSPITAL_COMMUNITY): Payer: No Typology Code available for payment source

## 2012-08-21 ENCOUNTER — Other Ambulatory Visit (HOSPITAL_COMMUNITY): Payer: No Typology Code available for payment source

## 2012-08-22 ENCOUNTER — Ambulatory Visit: Payer: Self-pay | Admitting: Family Medicine

## 2012-08-23 ENCOUNTER — Other Ambulatory Visit (HOSPITAL_COMMUNITY): Payer: No Typology Code available for payment source

## 2012-08-24 ENCOUNTER — Ambulatory Visit: Payer: Self-pay | Admitting: Family Medicine

## 2012-08-25 ENCOUNTER — Other Ambulatory Visit (HOSPITAL_COMMUNITY): Payer: No Typology Code available for payment source

## 2012-08-28 ENCOUNTER — Other Ambulatory Visit (HOSPITAL_COMMUNITY): Payer: No Typology Code available for payment source

## 2012-08-29 ENCOUNTER — Ambulatory Visit: Payer: Self-pay | Admitting: Family Medicine

## 2012-08-30 ENCOUNTER — Other Ambulatory Visit (HOSPITAL_COMMUNITY): Payer: No Typology Code available for payment source

## 2012-09-01 ENCOUNTER — Other Ambulatory Visit (HOSPITAL_COMMUNITY): Payer: No Typology Code available for payment source

## 2012-09-04 ENCOUNTER — Other Ambulatory Visit (HOSPITAL_COMMUNITY): Payer: No Typology Code available for payment source

## 2012-09-06 ENCOUNTER — Other Ambulatory Visit (HOSPITAL_COMMUNITY): Payer: No Typology Code available for payment source

## 2012-09-08 ENCOUNTER — Other Ambulatory Visit (HOSPITAL_COMMUNITY): Payer: No Typology Code available for payment source

## 2012-09-11 ENCOUNTER — Other Ambulatory Visit (HOSPITAL_COMMUNITY): Payer: No Typology Code available for payment source

## 2012-09-12 ENCOUNTER — Ambulatory Visit: Payer: Self-pay | Admitting: Family Medicine

## 2012-09-13 ENCOUNTER — Other Ambulatory Visit (HOSPITAL_COMMUNITY): Payer: No Typology Code available for payment source

## 2012-09-15 ENCOUNTER — Other Ambulatory Visit (HOSPITAL_COMMUNITY): Payer: No Typology Code available for payment source

## 2012-09-18 ENCOUNTER — Other Ambulatory Visit (HOSPITAL_COMMUNITY): Payer: No Typology Code available for payment source

## 2012-09-19 ENCOUNTER — Other Ambulatory Visit: Payer: Self-pay | Admitting: Family Medicine

## 2012-09-20 ENCOUNTER — Ambulatory Visit: Payer: Self-pay | Admitting: Family Medicine

## 2012-09-20 ENCOUNTER — Other Ambulatory Visit (HOSPITAL_COMMUNITY): Payer: No Typology Code available for payment source

## 2012-09-22 ENCOUNTER — Other Ambulatory Visit (HOSPITAL_COMMUNITY): Payer: No Typology Code available for payment source

## 2012-09-25 ENCOUNTER — Other Ambulatory Visit (HOSPITAL_COMMUNITY): Payer: No Typology Code available for payment source

## 2012-09-27 ENCOUNTER — Other Ambulatory Visit (HOSPITAL_COMMUNITY): Payer: No Typology Code available for payment source

## 2012-09-29 ENCOUNTER — Other Ambulatory Visit (HOSPITAL_COMMUNITY): Payer: No Typology Code available for payment source

## 2012-10-02 ENCOUNTER — Other Ambulatory Visit (HOSPITAL_COMMUNITY): Payer: No Typology Code available for payment source

## 2012-10-04 ENCOUNTER — Other Ambulatory Visit (HOSPITAL_COMMUNITY): Payer: No Typology Code available for payment source

## 2012-10-05 ENCOUNTER — Encounter: Payer: Self-pay | Admitting: Family Medicine

## 2012-10-05 ENCOUNTER — Ambulatory Visit (INDEPENDENT_AMBULATORY_CARE_PROVIDER_SITE_OTHER): Payer: Self-pay | Admitting: Family Medicine

## 2012-10-05 VITALS — BP 147/73 | Temp 98.2°F | Resp 73 | Ht 67.0 in | Wt 158.8 lb

## 2012-10-05 DIAGNOSIS — F3289 Other specified depressive episodes: Secondary | ICD-10-CM

## 2012-10-05 DIAGNOSIS — F1520 Other stimulant dependence, uncomplicated: Secondary | ICD-10-CM

## 2012-10-05 DIAGNOSIS — K219 Gastro-esophageal reflux disease without esophagitis: Secondary | ICD-10-CM

## 2012-10-05 DIAGNOSIS — F19939 Other psychoactive substance use, unspecified with withdrawal, unspecified: Secondary | ICD-10-CM

## 2012-10-05 DIAGNOSIS — F13939 Sedative, hypnotic or anxiolytic use, unspecified with withdrawal, unspecified: Secondary | ICD-10-CM

## 2012-10-05 DIAGNOSIS — F411 Generalized anxiety disorder: Secondary | ICD-10-CM

## 2012-10-05 DIAGNOSIS — F32A Depression, unspecified: Secondary | ICD-10-CM

## 2012-10-05 DIAGNOSIS — F329 Major depressive disorder, single episode, unspecified: Secondary | ICD-10-CM

## 2012-10-05 DIAGNOSIS — F13239 Sedative, hypnotic or anxiolytic dependence with withdrawal, unspecified: Secondary | ICD-10-CM

## 2012-10-05 DIAGNOSIS — F419 Anxiety disorder, unspecified: Secondary | ICD-10-CM

## 2012-10-05 MED ORDER — TRAZODONE HCL 300 MG PO TABS: 300.0000 mg | ORAL_TABLET | Freq: Every day | ORAL | Status: DC

## 2012-10-05 MED ORDER — HYDROXYZINE HCL 50 MG PO TABS: 50.0000 mg | ORAL_TABLET | Freq: Every evening | ORAL | Status: DC | PRN

## 2012-10-05 MED ORDER — CITALOPRAM HYDROBROMIDE 20 MG PO TABS: 20.0000 mg | ORAL_TABLET | Freq: Every day | ORAL | Status: DC

## 2012-10-05 NOTE — Progress Notes (Signed)
Subjective:    Karen Casey is a 57 y.o. female who presents to [facility] today for followup for mental health issues:  1.  alcoholism and benzodiazepine dependence: Patient returns today after having last been discharged in February. She followed up once or twice she states with outpatient therapy but has not seen them since at least the middle of March. She denies any alcohol use currently. She is asking for a "refill of medications." When asked which medication specifically she produced her bottle lorazepam. Please see anxiety below. She states she has not taken any of this medicine since leaving the hospital. Her trouble started with Xanax and when we tried to switch her to Ativan tapers. However she needed inpatient rehabilitation to stop her dependence.  #2. Anxiety and depression: These are concurrent diagnoses and run together. Depression is all the time. Anxiety is worse at night although she does this during the day as well. She is not sure she has any relief from hydroxyzine. She has been out of all of her medications for at least 2-3 weeks. She states that initially her anxiety worsened once stopping the Celexa but now is back to "her baseline." This interferes with sleep as he keeps her up at nighttime please see below.   #3. Insomnia. Patient has racing thoughts which keep her from falling asleep. She often has vivid nightmares for which he wakens and has trouble falling back asleep. She is taking trazodone at nighttime as well as the hydroxyzine but again has been out of these medicines for about 2 weeks. She is asking for either Ambien or Lunesta to help her fall asleep at night.   The following portions of the patient's history were reviewed and updated as appropriate: allergies, current medications, past medical history, family and social history, and problem list. Patient is a nonsmoker.    PMH reviewed.  Past Medical History  Diagnosis Date  . GERD (gastroesophageal reflux disease)    . Anxiety   . Depression   . Cataract 06/09/2012    Right eye  . Urinary incontinence 06/09/2012  . 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    Event occurred in 2007  . Alcohol abuse   . Seizures     xanax withdrawl   Past Surgical History  Procedure Laterality Date  . Appendectomy    . Left shoulder dislocation  Sept 2011  . Cataract extraction  06/09/2012    Left eye    Medications reviewed. Current Outpatient Prescriptions  Medication Sig Dispense Refill  . citalopram (CELEXA) 20 MG tablet Take 1 tablet (20 mg total) by mouth at bedtime. For depression  30 tablet  2  . gabapentin (NEURONTIN) 100 MG capsule Take 1 capsule (100 mg total) by mouth 3 (three) times daily. For anxiety/pain control  90 capsule  0  . hydrOXYzine (ATARAX/VISTARIL) 50 MG tablet Take 1 tablet (50 mg total) by mouth at bedtime as needed for anxiety. For anxiety/sleep  30 tablet  2  . Multiple Vitamin (MULTIVITAMIN WITH MINERALS) TABS Take 1 tablet by mouth daily. For vitamin deficiency      . nicotine (NICODERM CQ - DOSED IN MG/24 HOURS) 21 mg/24hr patch Place 1 patch onto the skin daily at 6 (six) AM. For Nicotine addiction  28 patch  0  . omeprazole (PRILOSEC) 20 MG capsule Take 2 capsules (40 mg total) by mouth daily. For acid refluxFor acid reflux  60 capsule  1  . polyethylene glycol (MIRALAX / GLYCOLAX) packet Take 17 g by  mouth daily. For constipation (THIS MEDICINE CAN BE BOUGHT OVER THE COUNTER)  14 each    . trazodone (DESYREL) 300 MG tablet Take 1 tablet (300 mg total) by mouth at bedtime.  30 tablet  2   No current facility-administered medications for this visit.    ROS as above otherwise neg.  No chest pain, palpitations, SOB, Fever, Chills, Abd pain, N/V/D.   Objective:   Physical Exam BP 147/73  Temp(Src) 98.2 F (36.8 C) (Oral)  Resp 73  Ht 5\' 7"  (1.702 m)  Wt 158 lb 12.8 oz (72.031 kg)  BMI 24.87 kg/m2 Gen:  Alert, cooperative patient who appears stated age in no acute distress.  Vital  signs reviewed. HEENT: EOMI,  previous cataract in left eye has been removed. MMM Cardiac:  Regular rate and rhythm without murmur auscultated.  Good S1/S2. Pulm:  Clear to auscultation bilaterally with good air movement.  No wheezes or rales noted.   Psych: Not depressed today. Slightly anxious appearing. She does have very mild baseline tremor. Linear and coherent thought process. Pleasant and conversant.  No results found for this or any previous visit (from the past 72 hour(s)).

## 2012-10-05 NOTE — Assessment & Plan Note (Addendum)
Refill hydroxyzine today. Discuss that this is to replace her previous benzodiazepine addiction. She was also on gabapentin when she left the hospital but states she does not remember taking this. I'm referring her today to Dr. Pascal Lux here in our clinic. I think she needs counseling as well as medication continuance.

## 2012-10-05 NOTE — Assessment & Plan Note (Signed)
Discuss with her that I'm not comfortable refilling her Ativan prescription due to her previous addiction. She was okay with this.  I also recommended she continue back today which she has been out of for the past 2-3 weeks. she states is not wanted to drink but I'm concerned she may start back

## 2012-10-05 NOTE — Assessment & Plan Note (Signed)
Refill for Celexa today. Also refilled trazodone and increase dose to 300 mg to help with sleep at nighttime as well as depression.

## 2012-10-05 NOTE — Patient Instructions (Signed)
Take the Celexa once daily.  Take the Trazodone at night for sleep.  Take the hyrodoxyzine up to 3 times a day if you need it for anxiety.  Don't drive with this  It was good to see you again!

## 2012-10-05 NOTE — Assessment & Plan Note (Signed)
Still taking omeprazole over-the-counter. This is greatly improved.

## 2012-10-06 ENCOUNTER — Other Ambulatory Visit (HOSPITAL_COMMUNITY): Payer: No Typology Code available for payment source

## 2012-10-06 ENCOUNTER — Telehealth: Payer: Self-pay | Admitting: *Deleted

## 2012-10-09 ENCOUNTER — Other Ambulatory Visit (HOSPITAL_COMMUNITY): Payer: No Typology Code available for payment source

## 2012-10-09 NOTE — Telephone Encounter (Signed)
Error

## 2012-10-11 ENCOUNTER — Encounter (HOSPITAL_COMMUNITY): Payer: Self-pay

## 2012-10-11 ENCOUNTER — Emergency Department (HOSPITAL_COMMUNITY)
Admission: EM | Admit: 2012-10-11 | Discharge: 2012-10-12 | Disposition: A | Discharge status: Home / Self Care | Payer: Self-pay | Attending: Emergency Medicine | Admitting: Emergency Medicine

## 2012-10-11 ENCOUNTER — Other Ambulatory Visit (HOSPITAL_COMMUNITY): Payer: No Typology Code available for payment source

## 2012-10-11 DIAGNOSIS — Z9089 Acquired absence of other organs: Secondary | ICD-10-CM | POA: Insufficient documentation

## 2012-10-11 DIAGNOSIS — F411 Generalized anxiety disorder: Secondary | ICD-10-CM | POA: Insufficient documentation

## 2012-10-11 DIAGNOSIS — F419 Anxiety disorder, unspecified: Secondary | ICD-10-CM

## 2012-10-11 DIAGNOSIS — R112 Nausea with vomiting, unspecified: Secondary | ICD-10-CM | POA: Insufficient documentation

## 2012-10-11 DIAGNOSIS — Z8719 Personal history of other diseases of the digestive system: Secondary | ICD-10-CM | POA: Insufficient documentation

## 2012-10-11 DIAGNOSIS — K219 Gastro-esophageal reflux disease without esophagitis: Secondary | ICD-10-CM | POA: Insufficient documentation

## 2012-10-11 DIAGNOSIS — F3289 Other specified depressive episodes: Secondary | ICD-10-CM | POA: Insufficient documentation

## 2012-10-11 DIAGNOSIS — R109 Unspecified abdominal pain: Secondary | ICD-10-CM | POA: Insufficient documentation

## 2012-10-11 DIAGNOSIS — Z87448 Personal history of other diseases of urinary system: Secondary | ICD-10-CM | POA: Insufficient documentation

## 2012-10-11 DIAGNOSIS — F329 Major depressive disorder, single episode, unspecified: Secondary | ICD-10-CM | POA: Insufficient documentation

## 2012-10-11 DIAGNOSIS — Z79899 Other long term (current) drug therapy: Secondary | ICD-10-CM | POA: Insufficient documentation

## 2012-10-11 DIAGNOSIS — Z9849 Cataract extraction status, unspecified eye: Secondary | ICD-10-CM | POA: Insufficient documentation

## 2012-10-11 DIAGNOSIS — G40909 Epilepsy, unspecified, not intractable, without status epilepticus: Secondary | ICD-10-CM | POA: Insufficient documentation

## 2012-10-11 DIAGNOSIS — F17200 Nicotine dependence, unspecified, uncomplicated: Secondary | ICD-10-CM | POA: Insufficient documentation

## 2012-10-11 LAB — URINALYSIS, MICROSCOPIC ONLY
Bilirubin Urine: NEGATIVE
Hgb urine dipstick: NEGATIVE
Nitrite: NEGATIVE
Specific Gravity, Urine: 1.021 (ref 1.005–1.030)
pH: 8 (ref 5.0–8.0)

## 2012-10-11 LAB — COMPREHENSIVE METABOLIC PANEL
ALT: 18 U/L (ref 0–35)
Albumin: 4.2 g/dL (ref 3.5–5.2)
Alkaline Phosphatase: 87 U/L (ref 39–117)
Potassium: 3.8 mEq/L (ref 3.5–5.1)
Sodium: 139 mEq/L (ref 135–145)
Total Protein: 7.3 g/dL (ref 6.0–8.3)

## 2012-10-11 LAB — CBC WITH DIFFERENTIAL/PLATELET
Basophils Relative: 0 % (ref 0–1)
Eosinophils Absolute: 0 10*3/uL (ref 0.0–0.7)
MCH: 30.8 pg (ref 26.0–34.0)
MCHC: 34.7 g/dL (ref 30.0–36.0)
Neutrophils Relative %: 83 % — ABNORMAL HIGH (ref 43–77)
Platelets: 214 10*3/uL (ref 150–400)
RBC: 5.17 MIL/uL — ABNORMAL HIGH (ref 3.87–5.11)

## 2012-10-11 MED ORDER — SODIUM CHLORIDE 0.9 % IV BOLUS (SEPSIS)
1000.0000 mL | Freq: Once | INTRAVENOUS | Status: AC
  Administered 2012-10-11: 1000 mL via INTRAVENOUS

## 2012-10-11 MED ORDER — ONDANSETRON 8 MG PO TBDP
8.0000 mg | ORAL_TABLET | Freq: Once | ORAL | Status: AC
  Administered 2012-10-11: 8 mg via ORAL
  Filled 2012-10-11: qty 1

## 2012-10-11 MED ORDER — SUCRALFATE 1 G PO TABS
1.0000 g | ORAL_TABLET | Freq: Once | ORAL | Status: AC
  Administered 2012-10-11: 1 g via ORAL
  Filled 2012-10-11: qty 1

## 2012-10-11 MED ORDER — ONDANSETRON HCL 4 MG/2ML IJ SOLN
4.0000 mg | Freq: Once | INTRAMUSCULAR | Status: AC
  Administered 2012-10-11: 4 mg via INTRAVENOUS
  Filled 2012-10-11: qty 2

## 2012-10-11 NOTE — ED Provider Notes (Signed)
History     CSN: [id number]  Arrival date & time 10/11/12  1950   First MD Initiated Contact with Patient 10/11/12 2106      Chief Complaint  Patient presents with  . 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  Recently prescribed Trazodone for sleep but "reportatly not working"  States last drink was 6 days ago   The history is provided by the patient.    Past Medical History  Diagnosis Date  . GERD (gastroesophageal reflux disease)   . Anxiety   . Depression   . Cataract 06/09/2012    Right eye  . Urinary incontinence 06/09/2012  . 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    Event occurred in 2007  . Alcohol abuse   . Seizures     xanax withdrawl    Past Surgical History  Procedure Laterality Date  . Appendectomy    . Left shoulder dislocation  Sept 2011  . Cataract extraction  06/09/2012    Left eye    Family History  Problem Relation Age of Onset  . Hypertension Mother   . Hyperlipidemia Mother   . Heart disease Father   . Depression Father   . Parkinsonism Father     History  Substance Use Topics  . Smoking status: Current Every Day Smoker -- 0.50 packs/day for 37 years    Types: Cigarettes  . Smokeless tobacco: Never Used  . Alcohol Use: 18.0 oz/week    30 Cans of beer per week     Comment: 8-12 beers daily     OB History   Grav Para Term Preterm Abortions TAB SAB Ect Mult Living                  Review of Systems  Constitutional: Negative for fever and chills.  Respiratory: Negative for shortness of breath.   Cardiovascular: Negative for chest pain.  Gastrointestinal: Positive for nausea, vomiting and abdominal pain. Negative for diarrhea and anal  bleeding.  Genitourinary: Negative for dysuria.  Skin: Negative for pallor.  Neurological: Negative for dizziness and headaches.  All other systems reviewed and are negative.    Allergies  Review of patient's allergies indicates no known allergies.  Home Medications   Current Outpatient Rx  Name  Route  Sig  Dispense  Refill  . citalopram (CELEXA) 20 MG tablet   Oral   Take 1 tablet (20 mg total) by mouth at bedtime. For depression   30 tablet   2   . hydrOXYzine (ATARAX/VISTARIL) 50 MG tablet   Oral   Take 1 tablet (50 mg total) by mouth at bedtime as needed for anxiety. For anxiety/sleep   30 tablet   2   . Multiple Vitamin (MULTIVITAMIN WITH MINERALS) TABS   Oral   Take 1 tablet by mouth daily. For vitamin deficiency         . omeprazole (PRILOSEC) 20 MG capsule   Oral   Take 2 capsules (40 mg total) by mouth daily. For acid refluxFor acid reflux   60 capsule   1   . trazodone (DESYREL) 300 MG tablet   Oral   Take 1 tablet (300 mg total) by mouth at bedtime.   30 tablet   2   . ondansetron (ZOFRAN) 4 MG tablet  Oral   Take 1 tablet (4 mg total) by mouth every 6 (six) hours.   12 tablet   0   . sucralfate (CARAFATE) 1 G tablet   Oral   Take 1 tablet (1 g total) by mouth 4 (four) times daily.   28 tablet   0     BP 100/61  Pulse 87  Temp(Src) 98.6 F (37 C) (Oral)  Resp 18  SpO2 96%  Physical Exam  Nursing note and vitals reviewed. Constitutional: She is oriented to person, place, and time. She appears well-developed and well-nourished.  HENT:  Head: Normocephalic.  Eyes: Pupils are equal, round, and reactive to light.  Neck: Normal range of motion.  Cardiovascular: Normal rate and regular rhythm.   Pulmonary/Chest: Effort normal. No respiratory distress. She has no wheezes.  Abdominal: Soft. Bowel sounds are normal. She exhibits no distension. There is no tenderness.  Musculoskeletal: Normal range of motion.  Neurological: She is alert  and oriented to person, place, and time.  Skin: Skin is warm and dry. No rash noted. No pallor.    ED Course  Procedures (including critical care time)  Labs Reviewed  CBC WITH DIFFERENTIAL - Abnormal; Notable for the following:    RBC 5.17 (*)    Hemoglobin 15.9 (*)    Neutrophils Relative 83 (*)    Lymphocytes Relative 11 (*)    All other components within normal limits  COMPREHENSIVE METABOLIC PANEL - Abnormal; Notable for the following:    Glucose, Bld 134 (*)    GFR calc non Af Amer 80 (*)    All other components within normal limits  URINALYSIS, MICROSCOPIC ONLY - Abnormal; Notable for the following:    APPearance TURBID (*)    Protein, ur 30 (*)    Leukocytes, UA TRACE (*)    All other components within normal limits  LIPASE, BLOOD   No results found.   1. GERD (gastroesophageal reflux disease)   2. Anxiety       MDM  Patient's labs reviewed within normal limits.  She did receive significant relief from her abdominal discomfort.  With Carafate and, Zofran.  We'll send her home with both also refer her to a GI specialist         Arman Filter, NP 10/12/12 0106

## 2012-10-11 NOTE — ED Notes (Signed)
Pt states she has been vomiting blood since Monday.  Pt is not on blood thinners.  Pt states vomited today x 20 and she feels light headed.  Pt states large amount of blood.  Color pink at window and able to ambulate without assistance.

## 2012-10-12 MED ORDER — ONDANSETRON HCL 4 MG PO TABS: 4.0000 mg | ORAL_TABLET | Freq: Four times a day (QID) | ORAL | Status: DC

## 2012-10-12 MED ORDER — SUCRALFATE 1 G PO TABS: 1.0000 g | ORAL_TABLET | Freq: Four times a day (QID) | ORAL | Status: DC

## 2012-10-12 NOTE — ED Provider Notes (Signed)
Medical screening examination/treatment/procedure(s) were performed by non-physician practitioner and as supervising physician I was immediately available for consultation/collaboration.   Kattie Santoyo, MD 10/12/12 1504 

## 2012-10-13 ENCOUNTER — Encounter: Payer: Self-pay | Admitting: Gastroenterology

## 2012-10-13 ENCOUNTER — Other Ambulatory Visit (HOSPITAL_COMMUNITY): Payer: No Typology Code available for payment source

## 2012-10-16 ENCOUNTER — Telehealth: Payer: Self-pay | Admitting: Family Medicine

## 2012-10-16 ENCOUNTER — Other Ambulatory Visit (HOSPITAL_COMMUNITY): Payer: No Typology Code available for payment source

## 2012-10-16 ENCOUNTER — Other Ambulatory Visit: Payer: Self-pay | Admitting: *Deleted

## 2012-10-16 NOTE — Telephone Encounter (Signed)
Pt got    ondansetron (ZOFRAN) 4 MG   At the ED last week and she doesn't have enough to last until her appt this Thursday.  Wants to know if she can have more called in. [facility] [address]

## 2012-10-17 MED ORDER — ONDANSETRON HCL 4 MG PO TABS: 4.0000 mg | ORAL_TABLET | Freq: Four times a day (QID) | ORAL | Status: DC

## 2012-10-17 NOTE — Telephone Encounter (Signed)
Called patient,after speaking with her she had the understanding that this was a medication she was to take daily,I explained that this medication zofran is only use for nausea.patient states that she is in no need at this time and will discuss this at her next appt. Dannon Perlow, Virgel Bouquet

## 2012-10-19 ENCOUNTER — Ambulatory Visit (INDEPENDENT_AMBULATORY_CARE_PROVIDER_SITE_OTHER): Payer: Self-pay | Admitting: Family Medicine

## 2012-10-19 ENCOUNTER — Encounter: Payer: Self-pay | Admitting: Family Medicine

## 2012-10-19 VITALS — BP 131/90 | HR 92 | Temp 98.0°F | Ht 67.0 in | Wt 159.1 lb

## 2012-10-19 DIAGNOSIS — R197 Diarrhea, unspecified: Secondary | ICD-10-CM

## 2012-10-19 MED ORDER — SUCRALFATE 1 G PO TABS: 1.0000 g | ORAL_TABLET | Freq: Three times a day (TID) | ORAL | Status: DC

## 2012-10-19 MED ORDER — ONDANSETRON HCL 4 MG PO TABS: 4.0000 mg | ORAL_TABLET | Freq: Four times a day (QID) | ORAL | Status: DC

## 2012-10-19 NOTE — Patient Instructions (Signed)
I'll send in the refills for both of the medicines.  Make sure to keep that appointment with the gastroenterologist.  If you start running to the bathroom again, take the Imodium.

## 2012-10-19 NOTE — Progress Notes (Signed)
Subjective:    Karen Casey is a 57 y.o. female who presents to [facility] today with complaints of diarrhea:  1.  Diarrhea:  Present for the past 2 days. She did present to the emergency room about a week ago with abdominal pain and nausea and vomiting. However this has since resolved. She has been taking Carafate and Zofran as needed.  No sick contacts. She describes diarrhea as loose and dark green in nature. No abdominal pain. No fevers or chills. No nausea vomiting currently no for past several days. Last episode of diarrhea was several hours ago. She says her stomach feels much better now.   The following portions of the patient's history were reviewed and updated as appropriate: allergies, current medications, past medical history, family and social history, and problem list. Patient is a nonsmoker.    PMH reviewed.  Past Medical History  Diagnosis Date  . GERD (gastroesophageal reflux disease)   . Anxiety   . Depression   . Cataract 06/09/2012    Right eye  . Urinary incontinence 06/09/2012  . 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    Event occurred in 2007  . Alcohol abuse   . Seizures     xanax withdrawl   Past Surgical History  Procedure Laterality Date  . Appendectomy    . Left shoulder dislocation  Sept 2011  . Cataract extraction  06/09/2012    Left eye    Medications reviewed. Current Outpatient Prescriptions  Medication Sig Dispense Refill  . citalopram (CELEXA) 20 MG tablet Take 1 tablet (20 mg total) by mouth at bedtime. For depression  30 tablet  2  . hydrOXYzine (ATARAX/VISTARIL) 50 MG tablet Take 1 tablet (50 mg total) by mouth at bedtime as needed for anxiety. For anxiety/sleep  30 tablet  2  . Multiple Vitamin (MULTIVITAMIN WITH MINERALS) TABS Take 1 tablet by mouth daily. For vitamin deficiency      . omeprazole (PRILOSEC) 20 MG capsule Take 2 capsules (40 mg total) by mouth daily. For acid refluxFor acid reflux  60 capsule  1  . ondansetron (ZOFRAN) 4 MG tablet  Take 1 tablet (4 mg total) by mouth every 6 (six) hours.  20 tablet  1  . sucralfate (CARAFATE) 1 G tablet Take 1 tablet (1 g total) by mouth 4 (four) times daily.  28 tablet  0  . trazodone (DESYREL) 300 MG tablet Take 1 tablet (300 mg total) by mouth at bedtime.  30 tablet  2   No current facility-administered medications for this visit.    ROS as above otherwise neg.  No chest pain, palpitations, SOB, Fever, Chills, Abd pain, N/V/D.   Objective:   Physical Exam BP 131/90  Pulse 92  Temp(Src) 98 F (36.7 C) (Oral)  Ht 5\' 7"  (1.702 m)  Wt 159 lb 1.6 oz (72.167 kg)  BMI 24.91 kg/m2 Gen:  Alert, cooperative patient who appears stated age in no acute distress.  Vital signs reviewed. HEENT: EOMI,  MMM Cardiac:  Regular rate and rhythm without murmur auscultated.  Good S1/S2. Pulm:  Clear to auscultation bilaterally with good air movement.  No wheezes or rales noted.   Abd:  Soft/nondistended/nontender.  Good bowel sounds throughout all four quadrants.  No masses noted.   No results found for this or any previous visit (from the past 72 hour(s)).

## 2012-10-20 DIAGNOSIS — R197 Diarrhea, unspecified: Secondary | ICD-10-CM | POA: Insufficient documentation

## 2012-10-20 NOTE — Assessment & Plan Note (Signed)
Symptomatic treatment for now. She can take Imodium if this recurs. Has not had diarrhea for several hours. She has an appointment set up to the emergency department with gastroenterology. She like refill for Carafate Zofran in case her nausea vomiting return

## 2012-10-25 ENCOUNTER — Ambulatory Visit (INDEPENDENT_AMBULATORY_CARE_PROVIDER_SITE_OTHER): Payer: No Typology Code available for payment source | Admitting: Gastroenterology

## 2012-10-25 ENCOUNTER — Encounter: Payer: Self-pay | Admitting: Gastroenterology

## 2012-10-25 VITALS — BP 110/70 | HR 80 | Ht 65.16 in | Wt 157.4 lb

## 2012-10-25 DIAGNOSIS — R112 Nausea with vomiting, unspecified: Secondary | ICD-10-CM

## 2012-10-25 DIAGNOSIS — K589 Irritable bowel syndrome without diarrhea: Secondary | ICD-10-CM

## 2012-10-25 MED ORDER — NA SULFATE-K SULFATE-MG SULF 17.5-3.13-1.6 GM/177ML PO SOLN: 1.0000 | Freq: Once | ORAL | Status: DC

## 2012-10-25 MED ORDER — HYOSCYAMINE SULFATE ER 0.375 MG PO TBCR: EXTENDED_RELEASE_TABLET | ORAL | Status: DC

## 2012-10-25 NOTE — Patient Instructions (Addendum)
You have been scheduled for an endoscopy with propofol. Please follow written instructions given to you at your visit today. If you use inhalers (even only as needed), please bring them with you on the day of your procedure. Your physician has requested that you go to www.startemmi.com and enter the access code given to you at your visit today. This web site gives a general overview about your procedure. However, you should still follow specific instructions given to you by our office regarding your preparation for the procedure.    You have been scheduled for a colonoscopy with propofol. Please follow written instructions given to you at your visit today.  Please pick up your prep kit at the pharmacy within the next 1-3 days. If you use inhalers (even only as needed), please bring them with you on the day of your procedure. Your physician has requested that you go to www.startemmi.com and enter the access code given to you at your visit today. This web site gives a general overview about your procedure. However, you should still follow specific instructions given to you by our office regarding your preparation for the procedure.  Discontinue Carafate

## 2012-10-25 NOTE — Assessment & Plan Note (Signed)
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 Gastroparesis is also a concern. Symptoms have not improved with PPI therapy and Carafate. There may be an anxiety component as well.  recommendations #1 discontinue Carafate and Prilosec #2 upper endoscopy #3 to consider gastric emptying scan pending results of above #4 trial of hyomax

## 2012-10-25 NOTE — Progress Notes (Signed)
History of Present Illness: 57 year old white female referred at the request of Dr. Gwendolyn Grant for evaluation of abdominal pain, nausea, and diarrhea. She's had GI issues since a child. She has fairly chronic upper epigastric pain that may be slightly exacerbated by eating. She has frequent nausea. She denies pyrosis. She is belching frequently and complains of dry mouth with difficulty initiating swallowing. She's been taking Carafate and Prilosec without improvement.  She also complains of intermittent diarrhea. There is no history of melena or hematochezia. There has been no change in symptoms with modifications of her psychiatric medicines.    Past Medical History  Diagnosis Date  . GERD (gastroesophageal reflux disease)   . Anxiety   . Depression   . Cataract 06/09/2012    Right eye  . Urinary incontinence 06/09/2012  . 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    Event occurred in 2007  . Alcohol abuse   . Seizures     xanax withdrawl   Past Surgical History  Procedure Laterality Date  . Appendectomy    . Left shoulder dislocation  Sept 2011  . Cataract extraction  06/09/2012    Left eye   family history includes Depression in her father; Heart disease in her father; Hyperlipidemia in her mother; Hypertension in her mother; and Parkinsonism in her father. Current Outpatient Prescriptions  Medication Sig Dispense Refill  . citalopram (CELEXA) 20 MG tablet Take 1 tablet (20 mg total) by mouth at bedtime. For depression  30 tablet  2  . hydrOXYzine (ATARAX/VISTARIL) 50 MG tablet Take 1 tablet (50 mg total) by mouth at bedtime as needed for anxiety. For anxiety/sleep  30 tablet  2  . Multiple Vitamin (MULTIVITAMIN WITH MINERALS) TABS Take 1 tablet by mouth daily. For vitamin deficiency      . omeprazole (PRILOSEC) 20 MG capsule Take 2 capsules (40 mg total) by mouth daily. For acid refluxFor acid reflux  60 capsule  1  . ondansetron (ZOFRAN) 4 MG tablet Take 1 tablet (4 mg total) by mouth every 6  (six) hours.  20 tablet  1  . sucralfate (CARAFATE) 1 G tablet Take 1 tablet (1 g total) by mouth 3 (three) times daily.  28 tablet  1  . trazodone (DESYREL) 300 MG tablet Take 1 tablet (300 mg total) by mouth at bedtime.  30 tablet  2   No current facility-administered medications for this visit.   Allergies as of 10/25/2012  . (No Known Allergies)    reports that she has been smoking Cigarettes.  She has a 18.5 pack-year smoking history. She has never used smokeless tobacco. She reports that she drinks about 18.0 ounces of alcohol per week. She reports that she uses illicit drugs (Marijuana and Other-see comments).     Review of Systems: she complains of chronic anxietyPertinent positive and negative review of systems were noted in the above HPI section. All other review of systems were otherwise negative.  Vital signs were reviewed in today's medical record Physical Exam: General: Well developed , well nourished, no acute distress Skin: anicteric Head: Normocephalic and atraumatic Eyes:  sclerae anicteric, EOMI Ears: Normal auditory acuity Mouth: No deformity or lesions Neck: Supple, no masses or thyromegaly Lungs: Clear throughout to auscultation Heart: Regular rate and rhythm; no murmurs, rubs or bruits Abdomen: Soft, non tender and non distended. No masses, hepatosplenomegaly or hernias noted. Normal Bowel sounds. There is very mild tenderness in the midepigastric area Rectal:deferred Musculoskeletal: Symmetrical with no gross deformities  Skin: No lesions on  visible extremities Pulses:  Normal pulses noted Extremities: No clubbing, cyanosis, edema or deformities noted Neurological: Alert oriented x 4, grossly nonfocal Cervical Nodes:  No significant cervical adenopathy Inguinal Nodes: No significant inguinal adenopathy Psychological:  Alert and cooperative. Normal mood and affect

## 2012-10-25 NOTE — Assessment & Plan Note (Addendum)
Intermittent diarrhea very likely is due to irritable bowel.  Underlying colitis is less likely.  Recommendations #1 colonoscopy for screening and for random biopsies if no abnormalities are seen

## 2012-10-26 ENCOUNTER — Ambulatory Visit (AMBULATORY_SURGERY_CENTER): Payer: No Typology Code available for payment source | Admitting: Gastroenterology

## 2012-10-26 ENCOUNTER — Encounter: Payer: Self-pay | Admitting: Gastroenterology

## 2012-10-26 VITALS — BP 138/96 | HR 65 | Temp 99.0°F | Resp 33 | Ht 65.0 in | Wt 157.0 lb

## 2012-10-26 DIAGNOSIS — K208 Other esophagitis without bleeding: Secondary | ICD-10-CM

## 2012-10-26 DIAGNOSIS — R112 Nausea with vomiting, unspecified: Secondary | ICD-10-CM

## 2012-10-26 DIAGNOSIS — K219 Gastro-esophageal reflux disease without esophagitis: Secondary | ICD-10-CM

## 2012-10-26 MED ORDER — DEXLANSOPRAZOLE 60 MG PO CPDR: 60.0000 mg | DELAYED_RELEASE_CAPSULE | Freq: Two times a day (BID) | ORAL | Status: DC

## 2012-10-26 MED ORDER — SODIUM CHLORIDE 0.9 % IV SOLN: 500.0000 mL | INTRAVENOUS | Status: DC

## 2012-10-26 NOTE — Op Note (Signed)
[facility] [address] [place], [address]   ENDOSCOPY PROCEDURE REPORT  PATIENT: Karen Casey, [name]  MR#: [id number] BIRTHDATE: [birth year] , 56  yrs. old GENDER: Female ENDOSCOPIST: Louis Meckel, MD REFERRED BY:  Renold Don, M.D. PROCEDURE DATE:  10/26/2012 PROCEDURE:  EGD w/ biopsy ASA CLASS:     Class II INDICATIONS:  Nausea.   Vomiting. MEDICATIONS: MAC sedation, administered by CRNA, propofol (Diprivan) 150mg  IV, and Simethicone 0.6cc PO TOPICAL ANESTHETIC: Cetacaine Spray  DESCRIPTION OF PROCEDURE: After the risks benefits and alternatives of the procedure were thoroughly explained, informed consent was obtained.  The LB [id number] endoscope was introduced through the mouth and advanced to the third portion of the duodenum. Without limitations.  The instrument was slowly withdrawn as the mucosa was fully examined.      At the GE junction there were multiple erosions.  Z line was irregular.  Biopsies were taken.   The remainder of the upper endoscopy exam was otherwise normal.  Retroflexed views revealed no abnormalities.     The scope was then withdrawn from the patient and the procedure completed.  COMPLICATIONS: There were no complications. ENDOSCOPIC IMPRESSION: 1.   esophagitis-rule out Barrett's esophagus  RECOMMENDATIONS: 1.  begin dexalant bid 2.  gastric emptying scan 2.  gastric emptying scan  REPEAT EXAM:  eSigned:  Louis Meckel, MD 10/26/2012 2:34 PM   CC:

## 2012-10-26 NOTE — Patient Instructions (Addendum)
YOU HAD AN ENDOSCOPIC PROCEDURE TODAY AT THE [facility]: Refer to the procedure report that was given to you for any specific questions about what was found during the examination.  If the procedure report does not answer your questions, please call your gastroenterologist to clarify.  If you requested that your care partner not be given the details of your procedure findings, then the procedure report has been included in a sealed envelope for you to review at your convenience later.  YOU SHOULD EXPECT: Some feelings of bloating in the abdomen. Passage of more gas than usual.  Walking can help get rid of the air that was put into your GI tract during the procedure and reduce the bloating. If you had a lower endoscopy (such as a colonoscopy or flexible sigmoidoscopy) you may notice spotting of blood in your stool or on the toilet paper. If you underwent a bowel prep for your procedure, then you may not have a normal bowel movement for a few days.  DIET: Your first meal following the procedure should be a light meal and then it is ok to progress to your normal diet.  A half-sandwich or bowl of soup is an example of a good first meal.  Heavy or fried foods are harder to digest and may make you feel nauseous or bloated.  Likewise meals heavy in dairy and vegetables can cause extra gas to form and this can also increase the bloating.  Drink plenty of fluids but you should avoid alcoholic beverages for 24 hours.  ACTIVITY: Your care partner should take you home directly after the procedure.  You should plan to take it easy, moving slowly for the rest of the day.  You can resume normal activity the day after the procedure however you should NOT DRIVE or use heavy machinery for 24 hours (because of the sedation medicines used during the test).    SYMPTOMS TO REPORT IMMEDIATELY: A gastroenterologist can be reached at any hour.  During normal business hours, 8:30 AM to 5:00 PM Monday through Friday,  call [phone].  After hours and on weekends, please call the GI answering service at [phone] who will take a message and have the physician on call contact you.    Following upper endoscopy (EGD)  Vomiting of blood or coffee ground material  New chest pain or pain under the shoulder blades  Painful or persistently difficult swallowing  New shortness of breath  Fever of 100F or higher  Black, tarry-looking stools  FOLLOW UP: If any biopsies were taken you will be contacted by phone or by letter within the next 1-3 weeks.  Call your gastroenterologist if you have not heard about the biopsies in 3 weeks.  Our staff will call the home number listed on your records the next business day following your procedure to check on you and address any questions or concerns that you may have at that time regarding the information given to you following your procedure. This is a courtesy call and so if there is no answer at the home number and we have not heard from you through the emergency physician on call, we will assume that you have returned to your regular daily activities without incident.   Esophagitis information given.  Start dexalant twice daily.,  Gastric emptying scan will be scheduled by Dr. Marzetta Board office.  SIGNATURES/CONFIDENTIALITY: You and/or your care partner have signed paperwork which will be entered into your electronic medical record.  These signatures attest  to the fact that that the information above on your After Visit Summary has been reviewed and is understood.  Full responsibility of the confidentiality of this discharge information lies with you and/or your care-partner. 

## 2012-10-26 NOTE — Progress Notes (Signed)
Called to room to assist during endoscopic procedure.  Patient ID and intended procedure confirmed with present staff. Received instructions for my participation in the procedure from the performing physician.  

## 2012-10-26 NOTE — Progress Notes (Signed)
Patient did not experience any of the following events: a burn prior to discharge; a fall within the facility; wrong site/side/patient/procedure/implant event; or a hospital transfer or hospital admission upon discharge from the facility. (G8907) Patient did not have preoperative order for IV antibiotic SSI prophylaxis. (G8918)  

## 2012-10-27 ENCOUNTER — Telehealth: Payer: Self-pay | Admitting: *Deleted

## 2012-10-27 ENCOUNTER — Telehealth: Payer: Self-pay | Admitting: Gastroenterology

## 2012-10-27 ENCOUNTER — Other Ambulatory Visit: Payer: Self-pay | Admitting: Gastroenterology

## 2012-10-27 ENCOUNTER — Telehealth: Payer: Self-pay

## 2012-10-27 DIAGNOSIS — R109 Unspecified abdominal pain: Secondary | ICD-10-CM

## 2012-10-27 MED ORDER — ESOMEPRAZOLE MAGNESIUM 40 MG PO CPDR: 40.0000 mg | DELAYED_RELEASE_CAPSULE | Freq: Every day | ORAL | Status: DC

## 2012-10-27 NOTE — Telephone Encounter (Signed)
  Follow up Call-  Call back number 10/26/2012  Post procedure Call Back phone  # [phone]  Permission to leave phone message Yes     Patient questions:  Do you have a fever, pain , or abdominal swelling? no Pain Score  0 *  Have you tolerated food without any problems? yes  Have you been able to return to your normal activities? yes  Do you have any questions about your discharge instructions: Diet   no Medications  no Follow up visit  no  Do you have questions or concerns about your Care? no  Actions: * If pain score is 4 or above: No action needed, pain <4.

## 2012-10-27 NOTE — Telephone Encounter (Signed)
Pt scheduled for GES at [facility] 11/15/12. Pt to arrive there at 6:45am for a 7am appt. Pt to hold dexilant prior to procedure. Left message for pt to call back.

## 2012-10-27 NOTE — Telephone Encounter (Signed)
reviewd patients med list and sent in Nexium for pt.Marland KitchenMarland Kitchen

## 2012-10-30 NOTE — Telephone Encounter (Signed)
Spoke with pt and she is aware of appt date and time. 

## 2012-10-31 ENCOUNTER — Other Ambulatory Visit: Payer: Self-pay | Admitting: Family Medicine

## 2012-10-31 MED ORDER — SUCRALFATE 1 G PO TABS: 1.0000 g | ORAL_TABLET | Freq: Four times a day (QID) | ORAL | Status: DC | PRN

## 2012-11-02 ENCOUNTER — Telehealth: Payer: Self-pay | Admitting: Internal Medicine

## 2012-11-02 NOTE — Telephone Encounter (Signed)
Pt called answering service to cancel her scheduled procedure for tomorrow, 11/03/12 at 3pm.  Her reason given to the answering service was she "ate the wrong foods" and "did not want to be sedated". Will alert Dr. Arlyce Dice and [facility] staff

## 2012-11-03 ENCOUNTER — Encounter: Payer: Self-pay | Admitting: Gastroenterology

## 2012-11-07 ENCOUNTER — Encounter: Payer: Self-pay | Admitting: Gastroenterology

## 2012-11-07 ENCOUNTER — Telehealth: Payer: Self-pay | Admitting: Gastroenterology

## 2012-11-07 MED ORDER — ESOMEPRAZOLE MAGNESIUM 40 MG PO CPDR: 40.0000 mg | DELAYED_RELEASE_CAPSULE | Freq: Every day | ORAL | Status: DC

## 2012-11-07 NOTE — Telephone Encounter (Signed)
Contacted pt. She stated she has never tried Nexium. The dexilant is 300$. Sent her a rx of nexium in

## 2012-11-15 ENCOUNTER — Encounter (HOSPITAL_COMMUNITY)
Admission: RE | Admit: 2012-11-15 | Discharge: 2012-11-15 | Disposition: A | Discharge status: Home / Self Care | Payer: No Typology Code available for payment source | Source: Ambulatory Visit | Attending: Gastroenterology | Admitting: Gastroenterology

## 2012-11-15 DIAGNOSIS — R109 Unspecified abdominal pain: Secondary | ICD-10-CM

## 2012-11-15 MED ORDER — TECHNETIUM TC 99M SULFUR COLLOID
2.1000 | Freq: Once | INTRAVENOUS | Status: AC | PRN
  Administered 2012-11-15: 2.1 via INTRAVENOUS

## 2012-11-17 ENCOUNTER — Telehealth: Payer: Self-pay | Admitting: Gastroenterology

## 2012-11-17 NOTE — Telephone Encounter (Signed)
Pt called for GES results. GES was normal. Pt aware.

## 2012-11-28 ENCOUNTER — Encounter: Payer: Self-pay | Admitting: Gastroenterology

## 2012-11-28 ENCOUNTER — Ambulatory Visit (AMBULATORY_SURGERY_CENTER): Payer: No Typology Code available for payment source | Admitting: Gastroenterology

## 2012-11-28 VITALS — BP 164/89 | HR 69 | Temp 99.0°F | Resp 23 | Ht 64.0 in | Wt 157.0 lb

## 2012-11-28 DIAGNOSIS — D126 Benign neoplasm of colon, unspecified: Secondary | ICD-10-CM

## 2012-11-28 DIAGNOSIS — R112 Nausea with vomiting, unspecified: Secondary | ICD-10-CM

## 2012-11-28 DIAGNOSIS — K589 Irritable bowel syndrome without diarrhea: Secondary | ICD-10-CM

## 2012-11-28 MED ORDER — SODIUM CHLORIDE 0.9 % IV SOLN: 500.0000 mL | INTRAVENOUS | Status: DC

## 2012-11-28 MED ORDER — SUCRALFATE 1 G PO TABS: 1.0000 g | ORAL_TABLET | Freq: Four times a day (QID) | ORAL | Status: DC | PRN

## 2012-11-28 NOTE — Progress Notes (Signed)
In pacu report to pacu rn, vss, bbs=clear A+O x 3

## 2012-11-28 NOTE — Progress Notes (Signed)
The pt came out of the restroom, and she said she was ready to leave.  She had a slight shake so I asked to lay of the stretcher a couple of minutes.  Pt did. Maw

## 2012-11-28 NOTE — Op Note (Signed)
[facility] [address] [place], [address]   COLONOSCOPY PROCEDURE REPORT  PATIENT: Karen, Casey  MR#: [id number] BIRTHDATE: 1955-10-31 , 56  yrs. old GENDER: Female ENDOSCOPIST: Louis Meckel, MD REFERRED BY: PROCEDURE DATE:  11/28/2012 PROCEDURE:   Colonoscopy with biopsy ASA CLASS:   Class II INDICATIONS:chronic diarrhea. MEDICATIONS: MAC sedation, administered by CRNA and Propofol (Diprivan) 260 mg IV  DESCRIPTION OF PROCEDURE:   After the risks benefits and alternatives of the procedure were thoroughly explained, informed consent was obtained.  A digital rectal exam revealed no abnormalities of the rectum.   The LB [id number]  endoscope was introduced through the anus and advanced to the cecum, which was identified by both the appendix and ileocecal valve. No adverse events experienced.   The quality of the prep was excellent using Suprep  The instrument was then slowly withdrawn as the colon was fully examined.  Photos not available      COLON FINDINGS: A normal appearing cecum, ileocecal valve, and appendiceal orifice were identified.  The ascending, hepatic flexure, transverse, splenic flexure, descending, sigmoid colon and rectum appeared unremarkable.  No polyps or cancers were seen. Random biopsies were taken to rule out microscopic colitis. Retroflexed views revealed no abnormalities. The time to cecum=5 minutes 14 seconds.  Withdrawal time=6 minutes 28 seconds.  The scope was withdrawn and the procedure completed. COMPLICATIONS: There were no complications.  ENDOSCOPIC IMPRESSION: Normal colon  RECOMMENDATIONS: 1.  Await biopsy results 2.  Repeat Colonscopy in 10 years. 3.  OV 3-4 weeks    eSigned:  Louis Meckel, MD 11/28/2012 8:50 AM   cc:

## 2012-11-28 NOTE — Patient Instructions (Addendum)
Per Dr. Arlyce Dice you need a return office visit for 3-4 weeks.  Please call if any questions or concerns.  Rx was sent to [facility].  Samples of Dexilant for 2 weeks given to the pt.     YOU HAD AN ENDOSCOPIC PROCEDURE TODAY AT THE [facility]: Refer to the procedure report that was given to you for any specific questions about what was found during the examination.  If the procedure report does not answer your questions, please call your gastroenterologist to clarify.  If you requested that your care partner not be given the details of your procedure findings, then the procedure report has been included in a sealed envelope for you to review at your convenience later.  YOU SHOULD EXPECT: Some feelings of bloating in the abdomen. Passage of more gas than usual.  Walking can help get rid of the air that was put into your GI tract during the procedure and reduce the bloating. If you had a lower endoscopy (such as a colonoscopy or flexible sigmoidoscopy) you may notice spotting of blood in your stool or on the toilet paper. If you underwent a bowel prep for your procedure, then you may not have a normal bowel movement for a few days.  DIET: Your first meal following the procedure should be a light meal and then it is ok to progress to your normal diet.  A half-sandwich or bowl of soup is an example of a good first meal.  Heavy or fried foods are harder to digest and may make you feel nauseous or bloated.  Likewise meals heavy in dairy and vegetables can cause extra gas to form and this can also increase the bloating.  Drink plenty of fluids but you should avoid alcoholic beverages for 24 hours.  ACTIVITY: Your care partner should take you home directly after the procedure.  You should plan to take it easy, moving slowly for the rest of the day.  You can resume normal activity the day after the procedure however you should NOT DRIVE or use heavy machinery for 24 hours  (because of the sedation medicines used during the test).    SYMPTOMS TO REPORT IMMEDIATELY: A gastroenterologist can be reached at any hour.  During normal business hours, 8:30 AM to 5:00 PM Monday through Friday, call [phone].  After hours and on weekends, please call the GI answering service at [phone] who will take a message and have the physician on call contact you.   Following lower endoscopy (colonoscopy or flexible sigmoidoscopy):  Excessive amounts of blood in the stool  Significant tenderness or worsening of abdominal pains  Swelling of the abdomen that is new, acute  Fever of 100F or higher   FOLLOW UP: If any biopsies were taken you will be contacted by phone or by letter within the next 1-3 weeks.  Call your gastroenterologist if you have not heard about the biopsies in 3 weeks.  Our staff will call the home number listed on your records the next business day following your procedure to check on you and address any questions or concerns that you may have at that time regarding the information given to you following your procedure. This is a courtesy call and so if there is no answer at the home number and we have not heard from you through the emergency physician on call, we will assume that you have returned to your regular daily activities without incident.  SIGNATURES/CONFIDENTIALITY: You and/or  your care partner have signed paperwork which will be entered into your electronic medical record.  These signatures attest to the fact that that the information above on your After Visit Summary has been reviewed and is understood.  Full responsibility of the confidentiality of this discharge information lies with you and/or your care-partner.

## 2012-11-28 NOTE — Progress Notes (Signed)
2440 the pt sip up small amout clear fluid.  Maw  [phone] Pt stated she was ready to go home.  I told her she could lay down longer and she said, "no, I want to go home". Maw

## 2012-11-28 NOTE — Progress Notes (Signed)
Patient did not experience any of the following events: a burn prior to discharge; a fall within the facility; wrong site/side/patient/procedure/implant event; or a hospital transfer or hospital admission upon discharge from the facility. (G8907) Patient did not have preoperative order for IV antibiotic SSI prophylaxis. (G8918)  

## 2012-11-28 NOTE — Progress Notes (Signed)
0900 pt was laying in the bed and complained of being hot.  I felt of her skin and she was dry and cool to touch.  I faned the pt and she felt better.  Pt passing a good of amount of gas.  Maw  [phone] once the pt had been on the mounter for 30 mins, I assisted the pt to the rest room.  Maw

## 2012-11-28 NOTE — Progress Notes (Signed)
CRNA made aware of pt's seizure in 05-2012 after Xanax withdrawal

## 2012-11-29 ENCOUNTER — Telehealth: Payer: Self-pay | Admitting: *Deleted

## 2012-11-29 NOTE — Telephone Encounter (Signed)
  Follow up Call-  Call back number 11/28/2012 10/26/2012  Post procedure Call Back phone  # [phone]  Permission to leave phone message Yes Yes     Patient questions:  Do you have a fever, pain , or abdominal swelling? no Pain Score  0 *  Have you tolerated food without any problems? no  Have you been able to return to your normal activities? yes  Do you have any questions about your discharge instructions: Diet   yes Medications  no Follow up visit  no  Do you have questions or concerns about your Care? no  Actions: * If pain score is 4 or above: No action needed, pain <4.  Pt. States that she vomited off and on yesterday.  States she feels better today.  Denies pain, fever, or discomfort.  Encouraged to gradually add fluids today and to add solid food back to diet.  Will call office if Any additional problems.

## 2012-12-18 ENCOUNTER — Encounter: Payer: Self-pay | Admitting: Gastroenterology

## 2012-12-20 ENCOUNTER — Other Ambulatory Visit: Payer: No Typology Code available for payment source

## 2012-12-20 ENCOUNTER — Encounter: Payer: Self-pay | Admitting: Gastroenterology

## 2012-12-20 ENCOUNTER — Ambulatory Visit (INDEPENDENT_AMBULATORY_CARE_PROVIDER_SITE_OTHER): Payer: No Typology Code available for payment source | Admitting: Gastroenterology

## 2012-12-20 VITALS — BP 112/78 | HR 64 | Ht 64.0 in | Wt 162.6 lb

## 2012-12-20 DIAGNOSIS — R112 Nausea with vomiting, unspecified: Secondary | ICD-10-CM

## 2012-12-20 DIAGNOSIS — R197 Diarrhea, unspecified: Secondary | ICD-10-CM

## 2012-12-20 NOTE — Assessment & Plan Note (Addendum)
Workup including colonoscopy with biopsies was negative. Symptoms  may be due to IBS; they preceded her medications. Doubt infection in view of longevity of symptoms.  Recommendations #1 check stools for lactoferrin and fecal elastase #2 trial of Imodium 2 tabs every morning and every 6 hours when necessary

## 2012-12-20 NOTE — Patient Instructions (Addendum)
Go to the basement today for stool test Use Imodium twice a day every morning to up to 4 times a day as needed Follow up in one month

## 2012-12-20 NOTE — Progress Notes (Signed)
History of Present Illness:  The patient has returned for followup of nausea and diarrhea. Upper endoscopy demonstrated mild esophagitis. Gastric emptying scan was normal. Colonoscopy was normal. Random biopsies did not show any abnormalities. She took a short course of dexilant. Nausea and bloating have subsided. Her main complaint is diarrhea. She typically has a loose stools 20-30 minutes after eating accompanied by severe urgency and occasional incontinence. She sometimes awakens at night to move her bowels. She's had symptoms for years. Symptoms preceded her anti-anxiety and antidepressant medications.    Review of Systems: Pertinent positive and negative review of systems were noted in the above HPI section. All other review of systems were otherwise negative.    Current Medications, Allergies, Past Medical History, Past Surgical History, Family History and Social History were reviewed in [employer] electronic medical record  Vital signs were reviewed in today's medical record. Physical Exam: General: Well developed , well nourished, no acute distress

## 2012-12-20 NOTE — Assessment & Plan Note (Signed)
Etiology not clearly determined. Symptoms have subsided.

## 2013-01-19 ENCOUNTER — Ambulatory Visit (INDEPENDENT_AMBULATORY_CARE_PROVIDER_SITE_OTHER): Payer: No Typology Code available for payment source | Admitting: Family Medicine

## 2013-01-19 ENCOUNTER — Encounter: Payer: Self-pay | Admitting: Family Medicine

## 2013-01-19 VITALS — BP 148/88 | HR 85 | Temp 97.9°F | Ht 64.0 in | Wt 169.5 lb

## 2013-01-19 DIAGNOSIS — K029 Dental caries, unspecified: Secondary | ICD-10-CM

## 2013-01-19 NOTE — Progress Notes (Signed)
Subjective:     Patient ID: Karen Casey, female   DOB: 1955/09/26, 57 y.o.   MRN: [id number]  HPI  1. Dental referral: Hasn't been to the dentist in over a year. Reports no bleeding, discharge, swelling. No fever, nausea, chills. Reports a filling falling out while eating some honey.   2. Back pain: Fell 3 days ago in a puddle a water. Fell on her back and shoulder. Has been using ice and heating with ibuprofen which is not helping. Ibuprofen 500 mg every 6 hours.  Hasn't been able to sleep because of the pain. Located in her midtrunk and lower back. Radiates down her spine and but pain anywhere else. The pain feels sharp and throbs. Nothing makes it worse. No history of previous trauma. Never had surgery on back. Also has a bruise on her shoulder.   Review of Systems WNL other than listed above     Objective:   Physical Exam Gen: NAD, alert, cooperative with exam HEENT: NCAT CV: RRR, good S1/S2, no murmur Resp: CTABL, no wheezes, non-labored Abd: SNTND, BS present, no guarding or organomegaly MSK: normal range of motion on forward flexion and extension, limited rotation. FROM of side bending, 5/5 bil on lower extremities, spinal process tenderness lower thoracic to lumbar. ;no bruise noted on exam  Ext: No edema, warm Neuro: Alert and oriented, No gross deficits, normal sensation in bil lower extremities      Assessment:     .1 Dental referral  2. Lumbar back pain x 3, acute process from fall     Plan:     1. Referred to dental for cleaning  2. Recommended Naproxen for 1 week. Continue to ice and heating alternatively for 20 minutes a piece. Stretch as necessary and perform core exercises to lesson strain on back. F/U in one month if pain persists. Told pt that back in lower back could take 2 weeks to resolve. If pain persists for longer than one month -->consider cyclobenzaprine.

## 2013-01-19 NOTE — Patient Instructions (Addendum)
Nice to meet you,   I have sent your Dental referral.   I advise you to take Naproxen (Aleve) for a week and continue to use heat and ice. Use ice and heat for twenty minutes, alternating between the two. This pain may last for a week or more because it is a slow healing part of the body. I think you should also stretch as much as possible and perform core workouts which will take strain off of your back. If this lasts longer than a month then schedule a follow up and we'll think of other routes of treatment.   Thank you.

## 2013-01-24 ENCOUNTER — Ambulatory Visit: Payer: Self-pay | Admitting: Gastroenterology

## 2013-01-24 ENCOUNTER — Other Ambulatory Visit: Payer: Self-pay | Admitting: Family Medicine

## 2013-02-05 ENCOUNTER — Emergency Department (HOSPITAL_COMMUNITY): Payer: No Typology Code available for payment source

## 2013-02-05 ENCOUNTER — Emergency Department (HOSPITAL_COMMUNITY)
Admission: EM | Admit: 2013-02-05 | Discharge: 2013-02-05 | Disposition: A | Discharge status: Home / Self Care | Payer: No Typology Code available for payment source | Attending: Emergency Medicine | Admitting: Emergency Medicine

## 2013-02-05 ENCOUNTER — Ambulatory Visit: Payer: Self-pay | Admitting: Family Medicine

## 2013-02-05 ENCOUNTER — Encounter (HOSPITAL_COMMUNITY): Payer: Self-pay | Admitting: Emergency Medicine

## 2013-02-05 DIAGNOSIS — F3289 Other specified depressive episodes: Secondary | ICD-10-CM | POA: Insufficient documentation

## 2013-02-05 DIAGNOSIS — Z79899 Other long term (current) drug therapy: Secondary | ICD-10-CM | POA: Insufficient documentation

## 2013-02-05 DIAGNOSIS — Y929 Unspecified place or not applicable: Secondary | ICD-10-CM | POA: Insufficient documentation

## 2013-02-05 DIAGNOSIS — S3981XA Other specified injuries of abdomen, initial encounter: Secondary | ICD-10-CM | POA: Insufficient documentation

## 2013-02-05 DIAGNOSIS — K219 Gastro-esophageal reflux disease without esophagitis: Secondary | ICD-10-CM | POA: Insufficient documentation

## 2013-02-05 DIAGNOSIS — F329 Major depressive disorder, single episode, unspecified: Secondary | ICD-10-CM | POA: Insufficient documentation

## 2013-02-05 DIAGNOSIS — Z8659 Personal history of other mental and behavioral disorders: Secondary | ICD-10-CM | POA: Insufficient documentation

## 2013-02-05 DIAGNOSIS — M5136 Other intervertebral disc degeneration, lumbar region: Secondary | ICD-10-CM

## 2013-02-05 DIAGNOSIS — F411 Generalized anxiety disorder: Secondary | ICD-10-CM | POA: Insufficient documentation

## 2013-02-05 DIAGNOSIS — Y9389 Activity, other specified: Secondary | ICD-10-CM | POA: Insufficient documentation

## 2013-02-05 DIAGNOSIS — Z8719 Personal history of other diseases of the digestive system: Secondary | ICD-10-CM | POA: Insufficient documentation

## 2013-02-05 DIAGNOSIS — M545 Low back pain, unspecified: Secondary | ICD-10-CM

## 2013-02-05 DIAGNOSIS — M51379 Other intervertebral disc degeneration, lumbosacral region without mention of lumbar back pain or lower extremity pain: Secondary | ICD-10-CM | POA: Insufficient documentation

## 2013-02-05 DIAGNOSIS — S46909A Unspecified injury of unspecified muscle, fascia and tendon at shoulder and upper arm level, unspecified arm, initial encounter: Secondary | ICD-10-CM | POA: Insufficient documentation

## 2013-02-05 DIAGNOSIS — M51369 Other intervertebral disc degeneration, lumbar region without mention of lumbar back pain or lower extremity pain: Secondary | ICD-10-CM

## 2013-02-05 DIAGNOSIS — M25512 Pain in left shoulder: Secondary | ICD-10-CM

## 2013-02-05 DIAGNOSIS — M5137 Other intervertebral disc degeneration, lumbosacral region: Secondary | ICD-10-CM | POA: Insufficient documentation

## 2013-02-05 DIAGNOSIS — Z8669 Personal history of other diseases of the nervous system and sense organs: Secondary | ICD-10-CM | POA: Insufficient documentation

## 2013-02-05 DIAGNOSIS — F17200 Nicotine dependence, unspecified, uncomplicated: Secondary | ICD-10-CM | POA: Insufficient documentation

## 2013-02-05 DIAGNOSIS — S4980XA Other specified injuries of shoulder and upper arm, unspecified arm, initial encounter: Secondary | ICD-10-CM | POA: Insufficient documentation

## 2013-02-05 DIAGNOSIS — W010XXA Fall on same level from slipping, tripping and stumbling without subsequent striking against object, initial encounter: Secondary | ICD-10-CM | POA: Insufficient documentation

## 2013-02-05 MED ORDER — IBUPROFEN 800 MG PO TABS: 800.0000 mg | ORAL_TABLET | Freq: Three times a day (TID) | ORAL | Status: DC

## 2013-02-05 NOTE — ED Notes (Signed)
Patient transported to X-ray 

## 2013-02-05 NOTE — ED Provider Notes (Signed)
CSN: [id number]     Arrival date & time 02/05/13  1128 History     First MD Initiated Contact with Patient 02/05/13 1134     Chief Complaint  Patient presents with  . Back Pain   (Consider location/radiation/quality/duration/timing/severity/associated sxs/prior Treatment) HPI Pt is a 57yo female with hx of anxiety and depression c/o LBP and left shoulder pain after fall at [employer] 3 weeks ago.  Pt states she slipped on water and placed her left hand out the stop her fall.  Since then, LBP is sharp and dull, 6/10, worse with ambulation and certain movements.  Pain also in right buttock and lateral thigh.  Has taken ibuprofen and aleve with minimal relief.  States she was going to go to PCP, Dr. Gwendolyn Grant later today but did not want to wait to be seen.  Denies bladder or bowel incontinence, denies dysuria or hematuria, denies numbness or tingling in legs or groin. No nausea, vomiting, or fever.    Past Medical History  Diagnosis Date  . GERD (gastroesophageal reflux disease)   . Anxiety   . Depression   . Urinary incontinence 06/09/2012  . 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    Event occurred in 2007  . Alcohol abuse   . Allergy   . Cataract 06/09/2012    Right eye and left eye  . Seizures     xanax withdrawl- December 2013   Past Surgical History  Procedure Laterality Date  . Appendectomy    . Left shoulder dislocation  Sept 2011  . Cataract extraction  06/09/2012    Left eye   Family History  Problem Relation Age of Onset  . Hypertension Mother   . Hyperlipidemia Mother   . Heart disease Father   . Depression Father   . Parkinsonism Father   . Colon cancer Neg Hx   . Esophageal cancer Neg Hx   . Rectal cancer Neg Hx   . Stomach cancer Neg Hx    History  Substance Use Topics  . Smoking status: Current Every Day Smoker -- 0.50 packs/day for 37 years    Types: Cigarettes  . Smokeless tobacco: Never Used  . Alcohol Use: 6.0 oz/week    10 Cans of beer per week   OB  History   Grav Para Term Preterm Abortions TAB SAB Ect Mult Living                 Review of Systems  Musculoskeletal: Positive for myalgias, back pain and arthralgias ( left shoulder).  All other systems reviewed and are negative.    Allergies  Review of patient's allergies indicates no known allergies.  Home Medications   Current Outpatient Rx  Name  Route  Sig  Dispense  Refill  . citalopram (CELEXA) 20 MG tablet      TAKE 1 TABLET BY MOUTH AT BEDTIME FOR DEPRESSION   30 tablet   1   . esomeprazole (NEXIUM) 40 MG capsule   Oral   Take 40 mg by mouth daily before breakfast.         . hydrOXYzine (ATARAX/VISTARIL) 50 MG tablet   Oral   Take 1 tablet (50 mg total) by mouth at bedtime as needed for anxiety. For anxiety/sleep   30 tablet   2   . Multiple Vitamin (MULTIVITAMIN WITH MINERALS) TABS   Oral   Take 1 tablet by mouth daily. For vitamin deficiency         . sucralfate (CARAFATE) 1 G tablet  Oral   Take 1 tablet (1 g total) by mouth 4 (four) times daily as needed.   120 tablet   1   . trazodone (DESYREL) 300 MG tablet   Oral   Take 1 tablet (300 mg total) by mouth at bedtime.   30 tablet   2   . ibuprofen (ADVIL,MOTRIN) 800 MG tablet   Oral   Take 1 tablet (800 mg total) by mouth 3 (three) times daily.   21 tablet   0    BP 140/83  Pulse 84  Temp(Src) 98.5 F (36.9 C) (Oral)  Resp 18  Ht 5\' 6"  (1.676 m)  Wt 164 lb (74.39 kg)  BMI 26.48 kg/m2  SpO2 96% Physical Exam  Nursing note and vitals reviewed. Constitutional: She is oriented to person, place, and time. She appears well-developed and well-nourished.  HENT:  Head: Normocephalic and atraumatic.  Eyes: EOM are normal.  Neck: Normal range of motion. Neck supple.  No midline bone tenderness, no crepitus or step-offs.    Cardiovascular: Normal rate.   Pulmonary/Chest: Effort normal and breath sounds normal. No respiratory distress. She has no wheezes. She has no rales. She exhibits  no tenderness.  Abdominal: Soft. She exhibits no distension. There is no tenderness.  Musculoskeletal: Normal range of motion. She exhibits tenderness ( TTP along lumbar spine and paraspinal muscles,  TTP left shoulder). She exhibits no edema.  Left shoulder: FROM, no deformity, or ecchymosis. TTP over [facility] joint. Lumbar spine: No step offs or crepitus.   Neurological: She is alert and oriented to person, place, and time.  Skin: Skin is warm and dry. No rash noted. No erythema.  Psychiatric: She has a normal mood and affect. Her behavior is normal.    ED Course   Procedures (including critical care time)  Labs Reviewed - No data to display Dg Lumbar Spine Complete  02/05/2013   CLINICAL DATA:  Fall 3 weeks ago. Worsening low back pain.  EXAM: LUMBAR SPINE - COMPLETE 4+ VIEW  COMPARISON:  None.  FINDINGS: No evidence of acute fracture, spondylolysis, or spondylolisthesis.  Transitional lumbosacral vertebra noted at S1. Mild degenerative disc disease noted at L4-5. Other intervertebral disc spaces are maintained. No significant facet arthropathy or other bone lesions identified. Generalized osteopenia noted.  IMPRESSION: No acute findings.  Mild L4-5 degenerative disc disease. Transitional S1 lumbosacral vertebra.  Osteopenia.   Electronically Signed   By: Myles Rosenthal   On: 02/05/2013 12:54   Dg Shoulder Left  02/05/2013   *RADIOLOGY REPORT*  Clinical Data: Back pain.  History of fall.  Left shoulder pain.  LEFT SHOULDER - 2+ VIEW  Comparison: 02/14/2010.  Findings: Three views of the left shoulder demonstrate no acute displaced fracture, subluxation, dislocation, joint or soft tissue abnormality.  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  IMPRESSION: 1.  No acute radiographic abnormality of the left shoulder.   Original Report Authenticated By: Trudie Reed, M.D.   1. Degenerative disc disease, lumbar   2. Shoulder pain, acute, left   3. LBP (low  back pain)     MDM  Pt with back pain but no red flag symptoms.  Plain films indicate degenerative disc disease, osteopenia w/o acute findings.  No acute abnormality of left shoulder.  Will tx symptomatically and have pt f/u with PCP, Dr. Gwendolyn Grant, as scheduled.  May need referral to specialist for continued pain. Return precautions provided.  Pt verbalized understanding and agreement with tx. Plan.  Junius Finner, PA-C 02/06/13 1031

## 2013-02-05 NOTE — ED Notes (Signed)
Patient states she fell about 3 weeks ago in [employer]. Patient c/o ongoing back pain since then. Patient c/o back pain from mid back down to waist and L shoulder pain. Patient reports a hx of L shoulder dislocation prior to the fall.

## 2013-02-05 NOTE — Progress Notes (Signed)
[facility] CL spoke with patient about her [facility] [employer]. Patient pcp is at [facility]. Made pt a follow-up appointment with her pcp on Tuesday, 9/2.

## 2013-02-06 NOTE — ED Provider Notes (Signed)
Medical screening examination/treatment/procedure(s) were performed by non-physician practitioner and as supervising physician I was immediately available for consultation/collaboration. Devoria Albe, MD, Armando Gang   Ward Givens, MD 02/06/13 [phone]

## 2013-02-13 ENCOUNTER — Ambulatory Visit (INDEPENDENT_AMBULATORY_CARE_PROVIDER_SITE_OTHER): Payer: No Typology Code available for payment source | Admitting: Family Medicine

## 2013-02-13 ENCOUNTER — Encounter: Payer: Self-pay | Admitting: Family Medicine

## 2013-02-13 VITALS — BP 131/85 | HR 85 | Temp 98.5°F | Ht 65.0 in | Wt 174.0 lb

## 2013-02-13 DIAGNOSIS — Z6825 Body mass index (BMI) 25.0-25.9, adult: Secondary | ICD-10-CM

## 2013-02-13 DIAGNOSIS — R358 Other polyuria: Secondary | ICD-10-CM

## 2013-02-13 DIAGNOSIS — Z0000 Encounter for general adult medical examination without abnormal findings: Secondary | ICD-10-CM

## 2013-02-13 DIAGNOSIS — R350 Frequency of micturition: Secondary | ICD-10-CM

## 2013-02-13 DIAGNOSIS — IMO0001 Reserved for inherently not codable concepts without codable children: Secondary | ICD-10-CM

## 2013-02-13 DIAGNOSIS — R3589 Other polyuria: Secondary | ICD-10-CM

## 2013-02-13 DIAGNOSIS — R030 Elevated blood-pressure reading, without diagnosis of hypertension: Secondary | ICD-10-CM

## 2013-02-13 DIAGNOSIS — E663 Overweight: Secondary | ICD-10-CM

## 2013-02-13 DIAGNOSIS — R7309 Other abnormal glucose: Secondary | ICD-10-CM

## 2013-02-13 DIAGNOSIS — R739 Hyperglycemia, unspecified: Secondary | ICD-10-CM

## 2013-02-13 DIAGNOSIS — M549 Dorsalgia, unspecified: Secondary | ICD-10-CM

## 2013-02-13 DIAGNOSIS — F1020 Alcohol dependence, uncomplicated: Secondary | ICD-10-CM

## 2013-02-13 LAB — POCT URINALYSIS DIPSTICK
Bilirubin, UA: NEGATIVE
Blood, UA: NEGATIVE
Glucose, UA: NEGATIVE
Ketones, UA: NEGATIVE
Nitrite, UA: NEGATIVE

## 2013-02-13 LAB — POCT UA - MICROSCOPIC ONLY

## 2013-02-13 LAB — GLUCOSE, CAPILLARY: Glucose-Capillary: 99 mg/dL (ref 70–99)

## 2013-02-13 MED ORDER — NAPROXEN 500 MG PO TABS: 500.0000 mg | ORAL_TABLET | Freq: Two times a day (BID) | ORAL | Status: DC

## 2013-02-13 MED ORDER — CEPHALEXIN 500 MG PO CAPS: 500.0000 mg | ORAL_CAPSULE | Freq: Four times a day (QID) | ORAL | Status: DC

## 2013-02-13 MED ORDER — CYCLOBENZAPRINE HCL 10 MG PO TABS: 10.0000 mg | ORAL_TABLET | Freq: Three times a day (TID) | ORAL | Status: DC | PRN

## 2013-02-13 NOTE — Progress Notes (Addendum)
Subjective:    Karen Casey is a 57 y.o. female who presents to [facility] today for several concerns:  1.  Fall/Back pain:  Persistent.  Fall occurred in grocery store, about 1 month ago.  Slipped on water on floor and laded on back.  Has been taking Ibuprofen 800 mg 2-3 times daily with only some relief.  Prescribed Naproxen last visit but did not know about this/pick it up.  Describes pain as 8/10 at its worst, dull throbbing and bilateral lumbar region.  Present most days.  Non-radiating.  Went to ED and had negative lumbar films.  No bladder/bowel incontinence.    2.  Strange dreams:  Since being restarted Trazodone last month.  Restarted herself b/c she wasn't sleeping, is sleeping better now except for the nocturia see below.  Not troubling, but she mentions these in passing.  No hallucinations or daytime dreaming.    3.  Gaining weight:  States that she's drinking a "few beers but nothing like before." Eats sweets like ice cream late at night, also eats lots of peanut butter.  Sounds like her meals are mostly carbs, sweets, and desserts.  Not walking outside due to heat.    4.  Urinary frequency:  Present for past 2-3 months.  Worse at night.  Nocturia every 2 hours.  Describes polyuria/polydipsia.  Drinking 6-8bottles of Gatorade or water daily.  Also describes polyphagia.  Has never had A1C before.    Prev health: Currently overdue for pap smear and mammogram.  The following portions of the patient's history were reviewed and updated as appropriate: allergies, current medications, past medical history, family and social history, and problem list. Patient is a nonsmoker.    PMH reviewed.  Past Medical History  Diagnosis Date  . GERD (gastroesophageal reflux disease)   . Anxiety   . Depression   . Urinary incontinence 06/09/2012  . 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    Event occurred in 2007  . Alcohol abuse   . Allergy   . Cataract 06/09/2012    Right eye and left eye  . Seizures      xanax withdrawl- December 2013   Past Surgical History  Procedure Laterality Date  . Appendectomy    . Left shoulder dislocation  Sept 2011  . Cataract extraction  06/09/2012    Left eye    Medications reviewed. Current Outpatient Prescriptions  Medication Sig Dispense Refill  . citalopram (CELEXA) 20 MG tablet TAKE 1 TABLET BY MOUTH AT BEDTIME FOR DEPRESSION  30 tablet  1  . esomeprazole (NEXIUM) 40 MG capsule Take 40 mg by mouth daily before breakfast.      . hydrOXYzine (ATARAX/VISTARIL) 50 MG tablet Take 1 tablet (50 mg total) by mouth at bedtime as needed for anxiety. For anxiety/sleep  30 tablet  2  . ibuprofen (ADVIL,MOTRIN) 800 MG tablet Take 1 tablet (800 mg total) by mouth 3 (three) times daily.  21 tablet  0  . Multiple Vitamin (MULTIVITAMIN WITH MINERALS) TABS Take 1 tablet by mouth daily. For vitamin deficiency      . sucralfate (CARAFATE) 1 G tablet Take 1 tablet (1 g total) by mouth 4 (four) times daily as needed.  120 tablet  1  . trazodone (DESYREL) 300 MG tablet Take 1 tablet (300 mg total) by mouth at bedtime.  30 tablet  2   No current facility-administered medications for this visit.    ROS as above otherwise neg.  No chest pain, palpitations, SOB, Fever, Chills  Objective:   Physical Exam BP 131/85  Pulse 85  Temp(Src) 98.5 F (36.9 C) (Oral)  Ht 5\' 5"  (1.651 m)  Wt 174 lb (78.926 kg)  BMI 28.96 kg/m2 Gen:  Alert, cooperative patient who appears stated age in no acute distress.  Vital signs reviewed.  Overweight appearing, which is new for her.   HEENT: EOMI,  MMM Cardiac:  Regular rate and rhythm  Pulm:  Clear to auscultation bilaterally with good air movement.  No wheezes or rales noted.   Abd:  Soft/nondistended/nontender.  Good bowel sounds throughout all four quadrants.  No masses noted.  Exts: Non edematous BL  LE, warm and well perfused.  Psych:  Not depressed or anxious appearing.  Linear and coherent thought process as evidenced by speech  pattern. Smiles spontaneously.    No results found for this or any previous visit (from the past 72 hour(s)).

## 2013-02-13 NOTE — Addendum Note (Signed)
Addended by: Jimmy Footman K on: 02/13/2013 12:15 PM   Modules accepted: Orders

## 2013-02-13 NOTE — Patient Instructions (Addendum)
Weight gain: 1.  We will check your thyroid for the weight gain.  I'd be surprised if it shows anything.   2.  Make sure that half of your plate is vegetables at most meals.  These will fill you up.  Carbs (bread, pasta, rice, sweets) only fill you up for a short period of time and then you're hungry again.  Back pain: - I will refer you to physical therapy today.  - Take the Naproxen 500 mg twice daily.  See if this helps.  - You can also take the muscle relaxer every 6-8 hours, but don't drive after this  UTI: - It does indeed look like you have a UTI. - Take the Keflex 4 times a day for the next 7 days. - Let me know in about 2 weeks if you're still having issues with having to run to the bathroom. - Your blood sugar/diabetes tests looked great!  We can do a pap smear next time I see you. Call for a mammogram and dental appointment (on back of [employer]).

## 2013-02-13 NOTE — Assessment & Plan Note (Signed)
Needs pap smear next visit. Provided info for mammogram

## 2013-02-14 DIAGNOSIS — R358 Other polyuria: Secondary | ICD-10-CM | POA: Insufficient documentation

## 2013-02-14 DIAGNOSIS — M549 Dorsalgia, unspecified: Secondary | ICD-10-CM | POA: Insufficient documentation

## 2013-02-14 DIAGNOSIS — R3589 Other polyuria: Secondary | ICD-10-CM | POA: Insufficient documentation

## 2013-02-14 DIAGNOSIS — E663 Overweight: Secondary | ICD-10-CM | POA: Insufficient documentation

## 2013-02-14 NOTE — Addendum Note (Signed)
Addended byGwendolyn Grant, Leesha Veno H on: 02/14/2013 11:16 AM   Modules accepted: Level of Service

## 2013-02-14 NOTE — Assessment & Plan Note (Signed)
Questionable UTI -- does have some but not many symptoms plus leukocytes in urine. Plan to treat empirically with Keflex and have patient follow up in about 2 weeks to ensure resolved. A1C/CBG not indicative of diabetes, which is good news.

## 2013-02-14 NOTE — Assessment & Plan Note (Signed)
Still an issue, although patient endorses cutting back.   Sporadically attending outpatient rehab/AA meetings.  Encouraged to continue.

## 2013-02-14 NOTE — Assessment & Plan Note (Signed)
Not imrpoved with Ibuprofen. Will try Naproxen instead as recommended last visit.  Adding Flexeril and PT referral. FU in 2-4 weeks if no improvement.  Usual course/time period of back pain discussed with patient.

## 2013-02-14 NOTE — Assessment & Plan Note (Signed)
Discussion about healthy eating habits. See instructions.

## 2013-02-23 ENCOUNTER — Ambulatory Visit: Payer: No Typology Code available for payment source | Attending: Family Medicine

## 2013-02-23 DIAGNOSIS — M545 Low back pain, unspecified: Secondary | ICD-10-CM | POA: Insufficient documentation

## 2013-02-23 DIAGNOSIS — M25559 Pain in unspecified hip: Secondary | ICD-10-CM | POA: Insufficient documentation

## 2013-02-23 DIAGNOSIS — IMO0001 Reserved for inherently not codable concepts without codable children: Secondary | ICD-10-CM | POA: Insufficient documentation

## 2013-02-27 ENCOUNTER — Ambulatory Visit: Payer: Self-pay | Admitting: Gastroenterology

## 2013-02-28 ENCOUNTER — Ambulatory Visit: Payer: No Typology Code available for payment source | Admitting: Rehabilitation

## 2013-03-01 ENCOUNTER — Ambulatory Visit: Payer: Self-pay | Admitting: Family Medicine

## 2013-03-02 ENCOUNTER — Ambulatory Visit: Payer: No Typology Code available for payment source

## 2013-03-05 ENCOUNTER — Ambulatory Visit: Payer: No Typology Code available for payment source | Admitting: Rehabilitation

## 2013-03-07 ENCOUNTER — Ambulatory Visit: Payer: Self-pay | Admitting: Gastroenterology

## 2013-03-07 ENCOUNTER — Ambulatory Visit: Payer: Self-pay | Admitting: Family Medicine

## 2013-03-07 ENCOUNTER — Ambulatory Visit: Payer: No Typology Code available for payment source | Admitting: Rehabilitation

## 2013-03-07 ENCOUNTER — Other Ambulatory Visit: Payer: Self-pay | Admitting: Family Medicine

## 2013-03-09 ENCOUNTER — Emergency Department (HOSPITAL_COMMUNITY)
Admission: EM | Admit: 2013-03-09 | Discharge: 2013-03-09 | Disposition: A | Discharge status: Home / Self Care | Payer: No Typology Code available for payment source | Attending: Emergency Medicine | Admitting: Emergency Medicine

## 2013-03-09 ENCOUNTER — Encounter (HOSPITAL_COMMUNITY): Payer: Self-pay | Admitting: *Deleted

## 2013-03-09 ENCOUNTER — Emergency Department (HOSPITAL_COMMUNITY): Payer: No Typology Code available for payment source

## 2013-03-09 DIAGNOSIS — R197 Diarrhea, unspecified: Secondary | ICD-10-CM | POA: Insufficient documentation

## 2013-03-09 DIAGNOSIS — F329 Major depressive disorder, single episode, unspecified: Secondary | ICD-10-CM | POA: Insufficient documentation

## 2013-03-09 DIAGNOSIS — F17200 Nicotine dependence, unspecified, uncomplicated: Secondary | ICD-10-CM | POA: Insufficient documentation

## 2013-03-09 DIAGNOSIS — R000 Tachycardia, unspecified: Secondary | ICD-10-CM | POA: Insufficient documentation

## 2013-03-09 DIAGNOSIS — Z792 Long term (current) use of antibiotics: Secondary | ICD-10-CM | POA: Insufficient documentation

## 2013-03-09 DIAGNOSIS — R109 Unspecified abdominal pain: Secondary | ICD-10-CM | POA: Insufficient documentation

## 2013-03-09 DIAGNOSIS — J189 Pneumonia, unspecified organism: Secondary | ICD-10-CM

## 2013-03-09 DIAGNOSIS — R112 Nausea with vomiting, unspecified: Secondary | ICD-10-CM | POA: Insufficient documentation

## 2013-03-09 DIAGNOSIS — Z8669 Personal history of other diseases of the nervous system and sense organs: Secondary | ICD-10-CM | POA: Insufficient documentation

## 2013-03-09 DIAGNOSIS — IMO0002 Reserved for concepts with insufficient information to code with codable children: Secondary | ICD-10-CM | POA: Insufficient documentation

## 2013-03-09 DIAGNOSIS — F411 Generalized anxiety disorder: Secondary | ICD-10-CM | POA: Insufficient documentation

## 2013-03-09 DIAGNOSIS — J159 Unspecified bacterial pneumonia: Secondary | ICD-10-CM | POA: Insufficient documentation

## 2013-03-09 DIAGNOSIS — Z79899 Other long term (current) drug therapy: Secondary | ICD-10-CM | POA: Insufficient documentation

## 2013-03-09 DIAGNOSIS — F3289 Other specified depressive episodes: Secondary | ICD-10-CM | POA: Insufficient documentation

## 2013-03-09 DIAGNOSIS — K219 Gastro-esophageal reflux disease without esophagitis: Secondary | ICD-10-CM | POA: Insufficient documentation

## 2013-03-09 LAB — URINE MICROSCOPIC-ADD ON

## 2013-03-09 LAB — COMPREHENSIVE METABOLIC PANEL
Albumin: 3.1 g/dL — ABNORMAL LOW (ref 3.5–5.2)
BUN: 7 mg/dL (ref 6–23)
Creatinine, Ser: 0.56 mg/dL (ref 0.50–1.10)
Total Bilirubin: 0.4 mg/dL (ref 0.3–1.2)
Total Protein: 7.2 g/dL (ref 6.0–8.3)

## 2013-03-09 LAB — CBC WITH DIFFERENTIAL/PLATELET
Basophils Absolute: 0 10*3/uL (ref 0.0–0.1)
Basophils Relative: 0 % (ref 0–1)
Eosinophils Absolute: 0 10*3/uL (ref 0.0–0.7)
Eosinophils Relative: 0 % (ref 0–5)
HCT: 39.5 % (ref 36.0–46.0)
MCHC: 35.9 g/dL (ref 30.0–36.0)
MCV: 87.6 fL (ref 78.0–100.0)
Monocytes Absolute: 1.4 10*3/uL — ABNORMAL HIGH (ref 0.1–1.0)
RDW: 12.5 % (ref 11.5–15.5)

## 2013-03-09 LAB — URINALYSIS, ROUTINE W REFLEX MICROSCOPIC
Ketones, ur: 40 mg/dL — AB
Leukocytes, UA: NEGATIVE
Nitrite: NEGATIVE
Specific Gravity, Urine: 1.028 (ref 1.005–1.030)
pH: 5.5 (ref 5.0–8.0)

## 2013-03-09 LAB — LIPASE, BLOOD: Lipase: 28 U/L (ref 11–59)

## 2013-03-09 MED ORDER — POTASSIUM CHLORIDE CRYS ER 20 MEQ PO TBCR
40.0000 meq | EXTENDED_RELEASE_TABLET | Freq: Once | ORAL | Status: AC
  Administered 2013-03-09: 40 meq via ORAL
  Filled 2013-03-09: qty 2

## 2013-03-09 MED ORDER — PREDNISONE 10 MG PO TABS: 20.0000 mg | ORAL_TABLET | Freq: Every day | ORAL | Status: DC

## 2013-03-09 MED ORDER — SODIUM CHLORIDE 0.9 % IV BOLUS (SEPSIS)
1000.0000 mL | Freq: Once | INTRAVENOUS | Status: AC
  Administered 2013-03-09: 1000 mL via INTRAVENOUS

## 2013-03-09 MED ORDER — AZITHROMYCIN 250 MG PO TABS: 250.0000 mg | ORAL_TABLET | Freq: Every day | ORAL | Status: DC

## 2013-03-09 MED ORDER — ONDANSETRON HCL 4 MG PO TABS: 4.0000 mg | ORAL_TABLET | Freq: Four times a day (QID) | ORAL | Status: DC

## 2013-03-09 MED ORDER — MORPHINE SULFATE 4 MG/ML IJ SOLN: 2.0000 mg | Freq: Once | INTRAMUSCULAR | Status: DC

## 2013-03-09 MED ORDER — AZITHROMYCIN 250 MG PO TABS
500.0000 mg | ORAL_TABLET | Freq: Once | ORAL | Status: AC
  Administered 2013-03-09: 500 mg via ORAL
  Filled 2013-03-09: qty 2

## 2013-03-09 MED ORDER — IOHEXOL 300 MG/ML  SOLN
100.0000 mL | Freq: Once | INTRAMUSCULAR | Status: AC | PRN
  Administered 2013-03-09: 100 mL via INTRAVENOUS

## 2013-03-09 MED ORDER — IOHEXOL 300 MG/ML  SOLN
50.0000 mL | Freq: Once | INTRAMUSCULAR | Status: AC | PRN
  Administered 2013-03-09: 50 mL via ORAL

## 2013-03-09 MED ORDER — ONDANSETRON HCL 4 MG/2ML IJ SOLN
4.0000 mg | INTRAMUSCULAR | Status: AC
  Administered 2013-03-09: 4 mg via INTRAVENOUS
  Filled 2013-03-09: qty 2

## 2013-03-09 MED ORDER — IBUPROFEN 800 MG PO TABS
800.0000 mg | ORAL_TABLET | Freq: Once | ORAL | Status: AC
  Administered 2013-03-09: 800 mg via ORAL
  Filled 2013-03-09: qty 1

## 2013-03-09 MED ORDER — ACETAMINOPHEN 500 MG PO TABS
1000.0000 mg | ORAL_TABLET | Freq: Once | ORAL | Status: AC
  Administered 2013-03-09: 1000 mg via ORAL
  Filled 2013-03-09: qty 2

## 2013-03-09 NOTE — ED Notes (Signed)
Pt reports n/vd and fever since Monday. States stool is loose and green. States she vomited 4 times during the night.

## 2013-03-09 NOTE — ED Provider Notes (Signed)
CSN: [id number]     Arrival date & time 03/09/13  4098 History   First MD Initiated Contact with Patient 03/09/13 [phone]     Chief Complaint  Patient presents with  . Emesis  . Fever   (Consider location/radiation/quality/duration/timing/severity/associated sxs/prior Treatment) HPI Comments: Patient is a 57 year old female with a history of anxiety, depression, and esophageal reflux as well as a surgical history of appendectomy who presents for nausea, vomiting, and diarrhea with onset 4 days ago. Patient states that symptoms have been persistent since onset without any alleviating factors. Patient states that eating and drinking makes her emesis worse; patient endorsing being unable to keep anything down by mouth. She endorses associated subjective fever and abdominal cramping and states her last episode of emesis and bowel movement was after being triaged. Stool loose, non-watery, and green in color. Patient denies sore throat, CP, SOB, urinary symptoms, melena, hematochezia, vaginal complaints, and numbness/tingling.  Patient is a 57 y.o. female presenting with vomiting and fever. The history is provided by the patient. No language interpreter was used.  Emesis Associated symptoms: abdominal pain and diarrhea   Fever Associated symptoms: diarrhea, nausea and vomiting   Associated symptoms: no chest pain and no dysuria     Past Medical History  Diagnosis Date  . GERD (gastroesophageal reflux disease)   . Anxiety   . Depression   . Urinary incontinence 06/09/2012  . 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    Event occurred in 2007  . Alcohol abuse   . Allergy   . Cataract 06/09/2012    Right eye and left eye  . Seizures     xanax withdrawl- December 2013   Past Surgical History  Procedure Laterality Date  . Appendectomy    . Left shoulder dislocation  Sept 2011  . Cataract extraction  06/09/2012    Left eye   Family History  Problem Relation Age of Onset  . Hypertension Mother   .  Hyperlipidemia Mother   . Heart disease Father   . Depression Father   . Parkinsonism Father   . Colon cancer Neg Hx   . Esophageal cancer Neg Hx   . Rectal cancer Neg Hx   . Stomach cancer Neg Hx    History  Substance Use Topics  . Smoking status: Current Every Day Smoker -- 0.50 packs/day for 37 years    Types: Cigarettes  . Smokeless tobacco: Never Used  . Alcohol Use: 6.0 oz/week    10 Cans of beer per week   OB History   Grav Para Term Preterm Abortions TAB SAB Ect Mult Living                 Review of Systems  Constitutional: Positive for fever (subjective).  Respiratory: Negative for shortness of breath.   Cardiovascular: Negative for chest pain.  Gastrointestinal: Positive for nausea, vomiting, abdominal pain and diarrhea. Negative for blood in stool.  Genitourinary: Negative for dysuria and hematuria.  All other systems reviewed and are negative.    Allergies  Review of patient's allergies indicates no known allergies.  Home Medications   Current Outpatient Rx  Name  Route  Sig  Dispense  Refill  . esomeprazole (NEXIUM) 40 MG capsule   Oral   Take 40 mg by mouth daily before breakfast.         . hydrOXYzine (ATARAX/VISTARIL) 50 MG tablet   Oral   Take 1 tablet (50 mg total) by mouth at bedtime as needed for anxiety. For anxiety/sleep  30 tablet   2   . ibuprofen (ADVIL,MOTRIN) 200 MG tablet   Oral   Take 400 mg by mouth every 6 (six) hours as needed for pain.         Marland Kitchen OVER THE COUNTER MEDICATION   Oral   Take 1 tablet by mouth daily. Little blue pill for sinus and allergy         . sucralfate (CARAFATE) 1 G tablet   Oral   Take 1 tablet (1 g total) by mouth 4 (four) times daily as needed.   120 tablet   1   . trazodone (DESYREL) 300 MG tablet   Oral   Take 1 tablet (300 mg total) by mouth at bedtime.   30 tablet   2   . azithromycin (ZITHROMAX) 250 MG tablet   Oral   Take 1 tablet (250 mg total) by mouth daily. Take 1 every day  until finished starting on 03/10/13.   4 tablet   0   . ondansetron (ZOFRAN) 4 MG tablet   Oral   Take 1 tablet (4 mg total) by mouth every 6 (six) hours.   12 tablet   0   . predniSONE (DELTASONE) 10 MG tablet   Oral   Take 2 tablets (20 mg total) by mouth daily.   15 tablet   0    BP 138/78  Pulse 98  Temp(Src) 99.7 F (37.6 C) (Oral)  Resp 22  SpO2 98%  Physical Exam  Nursing note and vitals reviewed. Constitutional: She is oriented to person, place, and time. She appears well-developed and well-nourished. No distress.  Patient tired, but nontoxic appearing. She is pleasant and in no acute distress.  HENT:  Head: Normocephalic and atraumatic.  Mouth/Throat: No oropharyngeal exudate.  Oropharynx clear. Mucous membranes dry.  Eyes: Conjunctivae and EOM are normal. Pupils are equal, round, and reactive to light. No scleral icterus.  Neck: Normal range of motion. Neck supple.  Cardiovascular: Regular rhythm, normal heart sounds and intact distal pulses.   Tachycardic to ~105  Pulmonary/Chest: Effort normal and breath sounds normal. No respiratory distress. She has no wheezes. She has no rales.  +adventitious sounds c/w smoking hx  Abdominal: Soft. She exhibits no mass. There is tenderness (Generalized, nonfocal). There is no rebound and no guarding.  No peritoneal signs or evidence of acute surgical abdomen.  Musculoskeletal: Normal range of motion.  Neurological: She is alert and oriented to person, place, and time.  Skin: Skin is warm and dry. No rash noted. She is not diaphoretic. No erythema. No pallor.  Psychiatric: She has a normal mood and affect. Her behavior is normal.    ED Course  Procedures (including critical care time) Labs Review Labs Reviewed  CBC WITH DIFFERENTIAL - Abnormal; Notable for the following:    WBC 17.3 (*)    Platelets 103 (*)    Neutrophils Relative % 90 (*)    Neutro Abs 15.5 (*)    Lymphocytes Relative 2 (*)    Lymphs Abs 0.3 (*)     Monocytes Absolute 1.4 (*)    All other components within normal limits  COMPREHENSIVE METABOLIC PANEL - Abnormal; Notable for the following:    Sodium 124 (*)    Potassium 3.2 (*)    Chloride 87 (*)    Glucose, Bld 148 (*)    Albumin 3.1 (*)    All other components within normal limits  URINALYSIS, ROUTINE W REFLEX MICROSCOPIC - Abnormal; Notable for the following:  Color, Urine AMBER (*)    APPearance CLOUDY (*)    Glucose, UA 100 (*)    Hgb urine dipstick MODERATE (*)    Bilirubin Urine SMALL (*)    Ketones, ur 40 (*)    Protein, ur 100 (*)    All other components within normal limits  URINE MICROSCOPIC-ADD ON - Abnormal; Notable for the following:    Squamous Epithelial / LPF FEW (*)    Bacteria, UA FEW (*)    All other components within normal limits  STOOL CULTURE  CLOSTRIDIUM DIFFICILE BY PCR  LIPASE, BLOOD  CG4 I-STAT (LACTIC ACID)   Imaging Review Dg Chest 2 View  03/09/2013   CLINICAL DATA:  One day history of chest pain and shortness of breath.  EXAM: CHEST  2 VIEW  COMPARISON:  02/14/2010, 05/14/2007.  FINDINGS: Dense airspace consolidation in the posterior right lower lobe. Prominent bronchovascular markings diffusely and moderate central peribronchial thickening, more so than on the prior examinations. Lungs otherwise clear. Probable small right pleural effusion. No left pleural effusion. Cardiomediastinal silhouette unremarkable and unchanged. Mild degenerative changes involving the thoracic spine.  IMPRESSION: Right lower lobe pneumonia superimposed upon moderate changes of acute bronchitis and/or asthma. Possible small right parapneumonic effusion.   Electronically Signed   By: Hulan Saas   On: 03/09/2013 15:02   Ct Abdomen Pelvis W Contrast  03/09/2013   CLINICAL DATA:  Abdominal pain. Fever. Nausea and vomiting.  EXAM: CT ABDOMEN AND PELVIS WITH CONTRAST  TECHNIQUE: Multidetector CT imaging of the abdomen and pelvis was performed using the standard  protocol following bolus administration of intravenous contrast.  CONTRAST:  OMNIPAQUE IOHEXOL 300 MG/ML  SOLN  COMPARISON:  08/23/2008  FINDINGS: Prominent consolidation in the right lower lobe. The liver, spleen, pancreas, and adrenal glands appear unremarkable. Gallbladder mildly contracted. Hypodense lesion of the left kidney upper pole is likely a cyst based on sharp definition and low-density. Right kidney unremarkable.  No pathologic upper abdominal adenopathy is observed. No pathologic pelvic adenopathy is observed.  Suspected 3 cm intramural fibroid in the left anterior uterine body. Urinary bladder unremarkable.  Lumbar degenerative disk disease noted.  IMPRESSION: 1. Dominant finding is extensive consolidation in the right lower lobe, suspicious for bacterial pneumonia. Two-view chest radiography is recommended to fully characterize the lungs and assess for multi lobar involvement. Followup imaging will likely be warranted to ensure complete clearance. 2. Left uterine fibroid. 3. Lumbar degenerative disc disease.   Electronically Signed   By: Herbie Baltimore   On: 03/09/2013 14:19    MDM   1. CAP (community acquired pneumonia)   2. Nausea and vomiting  Patient presents for N/V/D and generalized abdominal cramping with fever. Fever responding to tylenol. CT abd/pelvis without any acute findings. Lung findings on CT suggestive of PNA, confirmed on CXR. Findings explain leukocytosis of 17.3 and fever. Lactic acid WNL at 1.33. Patient tolerating POs in ED and has been without emesis since arrival; endorses marked improvement in symptoms with 2L IVF, tylenol, morphine and zofran. She ambulates without hypoxia; oxygen saturations 100% on room air while ambulating. Patient extremely well and nontoxic appearing and hemodynamically stable; no dyspnea, tachypnea, or tachycardia. Has ambulated to bathroom multiple times without difficulty or assistance; has remained this way for duration of 6+ hour ED  course. NaCl infusion given and KCl given PO. No anion gap and bicarb WNL. Stable for outpatient followup with primary care provider to ensure resolution of symptoms. Prescriptions for azithromycin and prednisone  given as well as Zofran as needed for nausea/vomiting. Return precautions discussed and patient agreeable to plan with no unaddressed concerns.    Antony Madura, PA-C 03/09/13 [phone]

## 2013-03-09 NOTE — Progress Notes (Signed)
[facility] spoke with patient. Patient has the [facility] [employer] and confirmed her PCP was [facility]. Patient declined my offer to set her up with a follow-up apt with PCP at this time.

## 2013-03-10 ENCOUNTER — Encounter (HOSPITAL_COMMUNITY): Payer: Self-pay

## 2013-03-10 ENCOUNTER — Emergency Department (HOSPITAL_COMMUNITY)
Admission: EM | Admit: 2013-03-10 | Discharge: 2013-03-10 | Disposition: A | Discharge status: Home / Self Care | Payer: No Typology Code available for payment source | Attending: Emergency Medicine | Admitting: Emergency Medicine

## 2013-03-10 DIAGNOSIS — Z79899 Other long term (current) drug therapy: Secondary | ICD-10-CM

## 2013-03-10 DIAGNOSIS — F17200 Nicotine dependence, unspecified, uncomplicated: Secondary | ICD-10-CM | POA: Insufficient documentation

## 2013-03-10 DIAGNOSIS — M549 Dorsalgia, unspecified: Secondary | ICD-10-CM | POA: Insufficient documentation

## 2013-03-10 DIAGNOSIS — K219 Gastro-esophageal reflux disease without esophagitis: Secondary | ICD-10-CM | POA: Insufficient documentation

## 2013-03-10 DIAGNOSIS — F329 Major depressive disorder, single episode, unspecified: Secondary | ICD-10-CM | POA: Insufficient documentation

## 2013-03-10 DIAGNOSIS — IMO0002 Reserved for concepts with insufficient information to code with codable children: Secondary | ICD-10-CM | POA: Insufficient documentation

## 2013-03-10 DIAGNOSIS — Z8669 Personal history of other diseases of the nervous system and sense organs: Secondary | ICD-10-CM | POA: Insufficient documentation

## 2013-03-10 DIAGNOSIS — J189 Pneumonia, unspecified organism: Secondary | ICD-10-CM

## 2013-03-10 DIAGNOSIS — F411 Generalized anxiety disorder: Secondary | ICD-10-CM | POA: Insufficient documentation

## 2013-03-10 DIAGNOSIS — F3289 Other specified depressive episodes: Secondary | ICD-10-CM | POA: Insufficient documentation

## 2013-03-10 DIAGNOSIS — R109 Unspecified abdominal pain: Secondary | ICD-10-CM | POA: Insufficient documentation

## 2013-03-10 DIAGNOSIS — Z87448 Personal history of other diseases of urinary system: Secondary | ICD-10-CM | POA: Insufficient documentation

## 2013-03-10 DIAGNOSIS — J159 Unspecified bacterial pneumonia: Secondary | ICD-10-CM | POA: Insufficient documentation

## 2013-03-10 DIAGNOSIS — R000 Tachycardia, unspecified: Secondary | ICD-10-CM | POA: Insufficient documentation

## 2013-03-10 MED ORDER — HYDROCOD POLST-CHLORPHEN POLST 10-8 MG/5ML PO LQCR: 5.0000 mL | Freq: Every evening | ORAL | Status: DC | PRN

## 2013-03-10 MED ORDER — SULFAMETHOXAZOLE-TRIMETHOPRIM 800-160 MG PO TABS: 1.0000 | ORAL_TABLET | Freq: Two times a day (BID) | ORAL | Status: DC

## 2013-03-10 NOTE — ED Provider Notes (Signed)
CSN: [id number]     Arrival date & time 03/10/13  0757 History   First MD Initiated Contact with Patient 03/10/13 [phone]     Chief Complaint  Patient presents with  . Flank Pain  . Hemoptysis   (Consider location/radiation/quality/duration/timing/severity/associated sxs/prior Treatment) HPI Comments: 57 y/o female with a PMHx of GERD, anxiety, depression, alcohol abuse, smoking and seizures presents to the ED complaining of continuing right sided back pain and productive cough with yellow and blood-tinged sputum since being seen in the ED yesterday. Pain was present for a few hours last night, sharp, however has not returned today. She was diagnosed with a RLL pneumonia, discharged home with azithromycin and prednisone. She did not get her medications filled yesterday as she was not feeling well, and today she was concerned the antibiotic was too expensive and wants a different antibiotic. Denies any new symptoms. Denies shortness of breath, fever, chills, n/v/d.   Patient is a 57 y.o. female presenting with flank pain. The history is provided by the patient.  Flank Pain Associated symptoms include coughing. Pertinent negatives include no abdominal pain, chest pain, chills, fever, nausea or vomiting.    Past Medical History  Diagnosis Date  . GERD (gastroesophageal reflux disease)   . Anxiety   . Depression   . Urinary incontinence 06/09/2012  . 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    Event occurred in 2007  . Alcohol abuse   . Allergy   . Cataract 06/09/2012    Right eye and left eye  . Seizures     xanax withdrawl- December 2013   Past Surgical History  Procedure Laterality Date  . Appendectomy    . Left shoulder dislocation  Sept 2011  . Cataract extraction  06/09/2012    Left eye   Family History  Problem Relation Age of Onset  . Hypertension Mother   . Hyperlipidemia Mother   . Heart disease Father   . Depression Father   . Parkinsonism Father   . Colon cancer Neg Hx   .  Esophageal cancer Neg Hx   . Rectal cancer Neg Hx   . Stomach cancer Neg Hx    History  Substance Use Topics  . Smoking status: Current Every Day Smoker -- 0.50 packs/day for 37 years    Types: Cigarettes  . Smokeless tobacco: Never Used  . Alcohol Use: 6.0 oz/week    10 Cans of beer per week   OB History   Grav Para Term Preterm Abortions TAB SAB Ect Mult Living                 Review of Systems  Constitutional: Negative for fever and chills.  Respiratory: Positive for cough. Negative for shortness of breath.   Cardiovascular: Negative for chest pain.  Gastrointestinal: Negative for nausea, vomiting and abdominal pain.  Musculoskeletal: Positive for back pain.  All other systems reviewed and are negative.    Allergies  Review of patient's allergies indicates no known allergies.  Home Medications   Current Outpatient Rx  Name  Route  Sig  Dispense  Refill  . azithromycin (ZITHROMAX) 250 MG tablet   Oral   Take 1 tablet (250 mg total) by mouth daily. Take 1 every day until finished starting on 03/10/13.   4 tablet   0   . esomeprazole (NEXIUM) 40 MG capsule   Oral   Take 40 mg by mouth daily before breakfast.         . hydrOXYzine (ATARAX/VISTARIL) 50 MG  tablet   Oral   Take 1 tablet (50 mg total) by mouth at bedtime as needed for anxiety. For anxiety/sleep   30 tablet   2   . ibuprofen (ADVIL,MOTRIN) 200 MG tablet   Oral   Take 400 mg by mouth every 6 (six) hours as needed for pain.         Marland Kitchen ondansetron (ZOFRAN) 4 MG tablet   Oral   Take 1 tablet (4 mg total) by mouth every 6 (six) hours.   12 tablet   0   . OVER THE COUNTER MEDICATION   Oral   Take 1 tablet by mouth daily. Little blue pill for sinus and allergy         . predniSONE (DELTASONE) 10 MG tablet   Oral   Take 2 tablets (20 mg total) by mouth daily.   15 tablet   0   . sucralfate (CARAFATE) 1 G tablet   Oral   Take 1 tablet (1 g total) by mouth 4 (four) times daily as needed.    120 tablet   1   . trazodone (DESYREL) 300 MG tablet   Oral   Take 1 tablet (300 mg total) by mouth at bedtime.   30 tablet   2    BP 112/79  Pulse 111  Temp(Src) 98.7 F (37.1 C) (Oral)  Resp 20  SpO2 96% Physical Exam  Nursing note and vitals reviewed. Constitutional: She is oriented to person, place, and time. She appears well-developed and well-nourished. No distress.  HENT:  Head: Normocephalic and atraumatic.  Mouth/Throat: Oropharynx is clear and moist.  Eyes: Conjunctivae are normal.  Neck: Normal range of motion. Neck supple.  Cardiovascular: Regular rhythm and normal heart sounds.  Tachycardia present.   Pulmonary/Chest: Effort normal and breath sounds normal. She has no wheezes. She has no rhonchi. She has no rales.  Diminished lung sounds R lower lung field.  Musculoskeletal: Normal range of motion. She exhibits no edema.  Neurological: She is alert and oriented to person, place, and time.  Skin: Skin is warm and dry. She is not diaphoretic.  Psychiatric: She has a normal mood and affect. Her behavior is normal.    ED Course  Procedures (including critical care time) Labs Review Labs Reviewed - No data to display Imaging Review Dg Chest 2 View  03/09/2013   CLINICAL DATA:  One day history of chest pain and shortness of breath.  EXAM: CHEST  2 VIEW  COMPARISON:  02/14/2010, 05/14/2007.  FINDINGS: Dense airspace consolidation in the posterior right lower lobe. Prominent bronchovascular markings diffusely and moderate central peribronchial thickening, more so than on the prior examinations. Lungs otherwise clear. Probable small right pleural effusion. No left pleural effusion. Cardiomediastinal silhouette unremarkable and unchanged. Mild degenerative changes involving the thoracic spine.  IMPRESSION: Right lower lobe pneumonia superimposed upon moderate changes of acute bronchitis and/or asthma. Possible small right parapneumonic effusion.   Electronically Signed   By:  Hulan Saas   On: 03/09/2013 15:02   Ct Abdomen Pelvis W Contrast  03/09/2013   CLINICAL DATA:  Abdominal pain. Fever. Nausea and vomiting.  EXAM: CT ABDOMEN AND PELVIS WITH CONTRAST  TECHNIQUE: Multidetector CT imaging of the abdomen and pelvis was performed using the standard protocol following bolus administration of intravenous contrast.  CONTRAST:  OMNIPAQUE IOHEXOL 300 MG/ML  SOLN  COMPARISON:  08/23/2008  FINDINGS: Prominent consolidation in the right lower lobe. The liver, spleen, pancreas, and adrenal glands appear unremarkable. Gallbladder  mildly contracted. Hypodense lesion of the left kidney upper pole is likely a cyst based on sharp definition and low-density. Right kidney unremarkable.  No pathologic upper abdominal adenopathy is observed. No pathologic pelvic adenopathy is observed.  Suspected 3 cm intramural fibroid in the left anterior uterine body. Urinary bladder unremarkable.  Lumbar degenerative disk disease noted.  IMPRESSION: 1. Dominant finding is extensive consolidation in the right lower lobe, suspicious for bacterial pneumonia. Two-view chest radiography is recommended to fully characterize the lungs and assess for multi lobar involvement. Followup imaging will likely be warranted to ensure complete clearance. 2. Left uterine fibroid. 3. Lumbar degenerative disc disease.   Electronically Signed   By: Herbie Baltimore   On: 03/09/2013 14:19    MDM   1. Medication therapy changed   2. CAP (community acquired pneumonia)    Patient requesting medication change after being prescribed azithromycin yesterday. Requesting medication from $4 list at [facility]. She was willing to pay $10 for levaquin (price at [facility]) however this interacts poorly with celexa. Will change antibiotic to Bactrim. Advised her to take prednisone which was prescribed yesterday. Tussionex for cough. She is well appearing and in NAD. Tachycardic, vitals otherwise stable. O2 sat 96% on RA. She is stable  for discharge. Return precautions discussed. F/u with PCP. Patient states understanding of treatment care plan and is agreeable.     Trevor Mace, PA-C 03/10/13 [phone]

## 2013-03-10 NOTE — ED Notes (Signed)
Pt c/o R flank pain and hemoptysis x 4 days.  Pain score 6/10.  Pt was seen at [facility] yesterday for same and was diagnosed with pneumonia.  Sts she is not feeling better, but has not had prescription filled.  NAD noted.  A & Ox4.

## 2013-03-10 NOTE — ED Provider Notes (Signed)
Medical screening examination/treatment/procedure(s) were performed by non-physician practitioner and as supervising physician I was immediately available for consultation/collaboration.  Hurman Horn, MD 03/10/13 [phone]

## 2013-03-12 ENCOUNTER — Other Ambulatory Visit: Payer: Self-pay | Admitting: Family Medicine

## 2013-03-13 NOTE — ED Provider Notes (Signed)
Medical screening examination/treatment/procedure(s) were performed by non-physician practitioner and as supervising physician I was immediately available for consultation/collaboration.   Suzi Roots, MD 03/13/13 [phone]

## 2013-03-14 ENCOUNTER — Ambulatory Visit (INDEPENDENT_AMBULATORY_CARE_PROVIDER_SITE_OTHER): Payer: No Typology Code available for payment source | Admitting: Family Medicine

## 2013-03-14 ENCOUNTER — Ambulatory Visit: Payer: Self-pay | Admitting: Family Medicine

## 2013-03-14 ENCOUNTER — Encounter: Payer: Self-pay | Admitting: Family Medicine

## 2013-03-14 VITALS — BP 130/65 | HR 116 | Temp 98.3°F | Ht 63.0 in | Wt 175.3 lb

## 2013-03-14 DIAGNOSIS — J189 Pneumonia, unspecified organism: Secondary | ICD-10-CM | POA: Insufficient documentation

## 2013-03-14 NOTE — Assessment & Plan Note (Signed)
Patient with recent ED for community-acquired pneumonia, was given azithromycin steroid burst. She is feeling better but still fatigued. Exam today is within normal limits. Encouraged patient to take in a couple deep breaths every hour. To followup with Dr. Raymondo Band for pulmonary function test after she is feeling better. I feel this may be a progression of her COPD and she could benefit from daily medications.she is also interested in smoking cessation and has purchasing E. Cigarette, I have also offered Dr. Raymondo Band for assistance during this as well. Patient is grateful and in understanding.

## 2013-03-14 NOTE — Patient Instructions (Signed)
It was a pleasure meeting you today Karen Casey. Your energy level be picking up within the next few days. Your exam sounded much better today. Take the antibiotics until completed and you may discontinue the steroids after 5 days.  I would like you to followup with Dr. Raymondo Band after you start to feel better, for a formal pulmonary function testing. He can also help you with smoking cessation.  Pneumonia, Adult Pneumonia is an infection of the lungs.  CAUSES Pneumonia may be caused by bacteria or a virus. Usually, these infections are caused by breathing infectious particles into the lungs (respiratory tract). SYMPTOMS   Cough.  Fever.  Chest pain.  Increased rate of breathing.  Wheezing.  Mucus production. DIAGNOSIS  If you have the common symptoms of pneumonia, your caregiver will typically confirm the diagnosis with a chest X-ray. The X-ray will show an abnormality in the lung (pulmonary infiltrate) if you have pneumonia. Other tests of your blood, urine, or sputum may be done to find the specific cause of your pneumonia. Your caregiver may also do tests (blood gases or pulse oximetry) to see how well your lungs are working. TREATMENT  Some forms of pneumonia may be spread to other people when you cough or sneeze. You may be asked to wear a mask before and during your exam. Pneumonia that is caused by bacteria is treated with antibiotic medicine. Pneumonia that is caused by the influenza virus may be treated with an antiviral medicine. Most other viral infections must run their course. These infections will not respond to antibiotics.  PREVENTION A pneumococcal shot (vaccine) is available to prevent a common bacterial cause of pneumonia. This is usually suggested for:  People over 52 years old.  Patients on chemotherapy.  People with chronic lung problems, such as bronchitis or emphysema.  People with immune system problems. If you are over 65 or have a high risk condition, you  may receive the pneumococcal vaccine if you have not received it before. In some countries, a routine influenza vaccine is also recommended. This vaccine can help prevent some cases of pneumonia.You may be offered the influenza vaccine as part of your care. If you smoke, it is time to quit. You may receive instructions on how to stop smoking. Your caregiver can provide medicines and counseling to help you quit. HOME CARE INSTRUCTIONS   Cough suppressants may be used if you are losing too much rest. However, coughing protects you by clearing your lungs. You should avoid using cough suppressants if you can.  Your caregiver may have prescribed medicine if he or she thinks your pneumonia is caused by a bacteria or influenza. Finish your medicine even if you start to feel better.  Your caregiver may also prescribe an expectorant. This loosens the mucus to be coughed up.  Only take over-the-counter or prescription medicines for pain, discomfort, or fever as directed by your caregiver.  Do not smoke. Smoking is a common cause of bronchitis and can contribute to pneumonia. If you are a smoker and continue to smoke, your cough may last several weeks after your pneumonia has cleared.  A cold steam vaporizer or humidifier in your room or home may help loosen mucus.  Coughing is often worse at night. Sleeping in a semi-upright position in a recliner or using a couple pillows under your head will help with this.  Get rest as you feel it is needed. Your body will usually let you know when you need to rest. SEEK IMMEDIATE  MEDICAL CARE IF:   Your illness becomes worse. This is especially true if you are elderly or weakened from any other disease.  You cannot control your cough with suppressants and are losing sleep.  You begin coughing up blood.  You develop pain which is getting worse or is uncontrolled with medicines.  You have a fever.  Any of the symptoms which initially brought you in for  treatment are getting worse rather than better.  You develop shortness of breath or chest pain. MAKE SURE YOU:   Understand these instructions.  Will watch your condition.  Will get help right away if you are not doing well or get worse. Document Released: 05/31/2005 Document Revised: 08/23/2011 Document Reviewed: 08/20/2010 [facility] Patient Information 2014 [place], [place].

## 2013-03-14 NOTE — Progress Notes (Signed)
Subjective:     Patient ID: Karen Casey, female   DOB: 1955-11-02, 57 y.o.   MRN: [id number]  HPI Community acquired pneumonia:  Patient is here today after ED visit for community acquired pneumonia last Friday. She is placed on azithromycin taper in steroid burst. She is feeling a little better. She is still tired. He was unable to start her antibiotics until Saturday, therefore she has only been on antibiotics for 5 days. She denies any fevers since her ED admission. She is able to tolerate foods, however her appetite is still decreased. No nausea, vomiting, diarrhea or shortness of breath.  Review of Systems Negative, with the exception of above mentioned in HPI     Objective:   Physical Exam BP 130/65  Pulse 116  Temp(Src) 98.3 F (36.8 C) (Oral)  Ht 5\' 3"  (1.6 m)  Wt 175 lb 4.8 oz (79.516 kg)  BMI 31.06 kg/m2  SpO2 97% Gen: NAD.  Appears fatigued. HEENT: AT. [place].  MMM.  CV: Mildly tachycardic. Regular rhythm.  Chest: Mild crackle RLL. Otherwise clear.  Skin: No rashes, purpura or petechiae.  Neuro: Normal gait. PERLA. EOMi. Alert. Grossly intact.      CXR: Right lower lobe pneumonia superimposed upon moderate changes of  acute bronchitis and/or asthma. Possible small right parapneumonic  effusion.

## 2013-03-29 ENCOUNTER — Encounter: Payer: Self-pay | Admitting: Family Medicine

## 2013-03-29 ENCOUNTER — Ambulatory Visit (INDEPENDENT_AMBULATORY_CARE_PROVIDER_SITE_OTHER): Payer: No Typology Code available for payment source | Admitting: Family Medicine

## 2013-03-29 VITALS — BP 138/83 | HR 91 | Temp 98.0°F | Ht 63.0 in | Wt 180.0 lb

## 2013-03-29 DIAGNOSIS — Z0000 Encounter for general adult medical examination without abnormal findings: Secondary | ICD-10-CM

## 2013-03-29 DIAGNOSIS — M549 Dorsalgia, unspecified: Secondary | ICD-10-CM

## 2013-03-29 DIAGNOSIS — Z6831 Body mass index (BMI) 31.0-31.9, adult: Secondary | ICD-10-CM | POA: Insufficient documentation

## 2013-03-29 DIAGNOSIS — Z23 Encounter for immunization: Secondary | ICD-10-CM

## 2013-03-29 DIAGNOSIS — J189 Pneumonia, unspecified organism: Secondary | ICD-10-CM

## 2013-03-29 DIAGNOSIS — E669 Obesity, unspecified: Secondary | ICD-10-CM

## 2013-03-29 NOTE — Progress Notes (Signed)
Subjective:    Karen Casey is a 57 y.o. female who presents to [facility] today for medical release for her back:  1.  Back pain: Patient is now working at [employer].  Evidently she needs a note saying she has been released from my care for either her work or [employer], she is unsure which.  Still some mild nagging pain but otherwise doing well.  Dull ache on certain days, especially when on her feet more often.    2.  Obesity:  Would like to lose weight.  Has cut out sweets and bread.  Eats some fruits and vegetables but not consistently.  Walks 2-3 times a week but cannot walk in her neighborhood due to safety.    3.  Cough:  Multiple ED visits and OV for CAP.  Still with residual cough.  No fevers or chills.  Non productive of sputum.  No rhinorrhea, nausea, vomiting.  Only on certain days.     Prev health:  Flu shot today.  Given info on mammogram.    The following portions of the patient's history were reviewed and updated as appropriate: allergies, current medications, past medical history, family and social history, and problem list. Patient is a nonsmoker.    PMH reviewed.  Past Medical History  Diagnosis Date  . GERD (gastroesophageal reflux disease)   . Anxiety   . Depression   . Urinary incontinence 06/09/2012  . 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    Event occurred in 2007  . Alcohol abuse   . Allergy   . Cataract 06/09/2012    Right eye and left eye  . Seizures     xanax withdrawl- December 2013   Past Surgical History  Procedure Laterality Date  . Appendectomy    . Left shoulder dislocation  Sept 2011  . Cataract extraction  06/09/2012    Left eye    Medications reviewed. Current Outpatient Prescriptions  Medication Sig Dispense Refill  . azithromycin (ZITHROMAX) 250 MG tablet Take 250 mg by mouth daily. 4 day course of therapy starting 03/10/13      . chlorpheniramine-HYDROcodone (TUSSIONEX PENNKINETIC ER) 10-8 MG/5ML LQCR Take 5 mLs by mouth at bedtime as  needed.  140 mL  0  . citalopram (CELEXA) 10 MG tablet Take 10 mg by mouth daily.      Marland Kitchen esomeprazole (NEXIUM) 40 MG capsule Take 40 mg by mouth daily before breakfast.      . hydrOXYzine (ATARAX/VISTARIL) 50 MG tablet TAKE 1 TABLET BY MOUTH AT BEDTIME AS NEEDED FOR ANXIETY/SLEEP  30 tablet  PRN  . ibuprofen (ADVIL,MOTRIN) 200 MG tablet Take 400 mg by mouth every 8 (eight) hours as needed for pain.       Marland Kitchen ondansetron (ZOFRAN) 4 MG tablet Take 1 tablet (4 mg total) by mouth every 6 (six) hours.  12 tablet  0  . predniSONE (DELTASONE) 10 MG tablet Take 2 tablets (20 mg total) by mouth daily.  15 tablet  0  . sucralfate (CARAFATE) 1 G tablet Take 1 g by mouth 4 (four) times daily as needed (for nausea).      . sulfamethoxazole-trimethoprim (BACTRIM DS,SEPTRA DS) 800-160 MG per tablet Take 1 tablet by mouth 2 (two) times daily. One po bid x 7 days  14 tablet  0  . trazodone (DESYREL) 300 MG tablet Take 1 tablet (300 mg total) by mouth at bedtime.  30 tablet  2   No current facility-administered medications for this visit.  ROS as above otherwise neg.  No chest pain, palpitations, SOB, Fever, Chills, Abd pain, N/V/D.   Objective:   Physical Exam BP 138/83  Pulse 91  Temp(Src) 98 F (36.7 C) (Oral)  Ht 5\' 3"  (1.6 m)  Wt 180 lb (81.647 kg)  BMI 31.89 kg/m2  SpO2 100% Gen:  Alert, cooperative patient who appears stated age in no acute distress.  Vital signs reviewed.  Somewhat obese HEENT: EOMI,  MMM Cardiac:  Regular rate and rhythm without murmur auscultated.  Good S1/S2. Pulm:  Clear to auscultation bilaterally with good air movement.  No wheezes or rales noted.   Back:  Normal skin, Spine with normal alignment and no deformity.  No tenderness to vertebral process palpation.  Paraspinous muscles are not tender and without spasm, except for some residual pain Right thoracic region.  Range of motion is full at neck and lumbar sacral regions.  Straight leg raise is negative for back  pain Neuro:  Sensation and motor function 5/5 bilateral lower extremities.     Exts: Non edematous BL  LE, warm and well perfused.   No results found for this or any previous visit (from the past 72 hour(s)).

## 2013-03-29 NOTE — Patient Instructions (Addendum)
Everything looks good today.  I'll get you a note for work.  Give Dr. Gerilyn Pilgrim a call to set up an appointment.  Going for walks is a great idea!  I'm glad to hear you're working again.    Come back and see me sometime after the new year.

## 2013-03-29 NOTE — Assessment & Plan Note (Signed)
Mammogram info provided today for her

## 2013-03-29 NOTE — Assessment & Plan Note (Signed)
Currently under control.  Some residual pain on days when she is on her feet for extended periods oftime.   Cleared for work. I have written note for her.

## 2013-03-29 NOTE — Assessment & Plan Note (Signed)
Discussed that walking is good for her, but for weight loss (more than just maintenance) she will need increased amount. She is interested in meeting with nutritionist.  I have provided her with number today.   Discussion on healthy food choices.

## 2013-03-29 NOTE — Assessment & Plan Note (Signed)
Much improved today.  Still some residual pleuritic pain, very mild.  Non productive cough.   Otherwise clear and much improved.

## 2013-04-05 ENCOUNTER — Other Ambulatory Visit: Payer: Self-pay | Admitting: Family Medicine

## 2013-04-05 ENCOUNTER — Ambulatory Visit: Payer: Self-pay | Admitting: Gastroenterology

## 2013-04-05 DIAGNOSIS — Z1231 Encounter for screening mammogram for malignant neoplasm of breast: Secondary | ICD-10-CM

## 2013-04-26 ENCOUNTER — Ambulatory Visit (HOSPITAL_COMMUNITY): Payer: No Typology Code available for payment source | Attending: Family Medicine

## 2013-05-08 ENCOUNTER — Ambulatory Visit: Payer: Self-pay

## 2013-05-09 ENCOUNTER — Ambulatory Visit: Payer: Self-pay

## 2013-05-16 ENCOUNTER — Other Ambulatory Visit: Payer: Self-pay | Admitting: Gastroenterology

## 2013-05-16 ENCOUNTER — Other Ambulatory Visit: Payer: Self-pay | Admitting: Family Medicine

## 2013-05-23 ENCOUNTER — Ambulatory Visit: Payer: Self-pay | Admitting: Family Medicine

## 2013-10-17 ENCOUNTER — Ambulatory Visit: Payer: Self-pay | Admitting: Sports Medicine

## 2013-11-06 ENCOUNTER — Ambulatory Visit: Payer: Self-pay | Admitting: Family Medicine

## 2013-11-08 ENCOUNTER — Other Ambulatory Visit: Payer: Self-pay | Admitting: *Deleted

## 2013-11-08 MED ORDER — CITALOPRAM HYDROBROMIDE 20 MG PO TABS: ORAL_TABLET | ORAL | Status: DC

## 2013-11-08 MED ORDER — HYDROXYZINE HCL 50 MG PO TABS: ORAL_TABLET | ORAL | Status: DC

## 2013-11-08 MED ORDER — CYCLOBENZAPRINE HCL 10 MG PO TABS: ORAL_TABLET | ORAL | Status: DC

## 2013-11-08 MED ORDER — SUCRALFATE 1 G PO TABS: ORAL_TABLET | ORAL | Status: DC

## 2013-11-08 NOTE — Telephone Encounter (Signed)
Request for 90 day supply. Tomika Eckles L Natale Barba, RN  

## 2013-11-12 ENCOUNTER — Other Ambulatory Visit: Payer: Self-pay | Admitting: *Deleted

## 2013-11-12 MED ORDER — CITALOPRAM HYDROBROMIDE 20 MG PO TABS
ORAL_TABLET | ORAL | Status: DC
Start: 2013-11-12 — End: 2013-12-04

## 2013-11-12 NOTE — Telephone Encounter (Signed)
Refill request is for 90 day supply. Derl Barrow, RN

## 2013-11-26 ENCOUNTER — Ambulatory Visit (INDEPENDENT_AMBULATORY_CARE_PROVIDER_SITE_OTHER): Payer: No Typology Code available for payment source | Admitting: Family Medicine

## 2013-11-26 ENCOUNTER — Encounter: Payer: Self-pay | Admitting: Family Medicine

## 2013-11-26 VITALS — BP 166/106 | HR 92 | Temp 98.2°F | Wt 184.0 lb

## 2013-11-26 DIAGNOSIS — R399 Unspecified symptoms and signs involving the genitourinary system: Secondary | ICD-10-CM

## 2013-11-26 DIAGNOSIS — R3989 Other symptoms and signs involving the genitourinary system: Secondary | ICD-10-CM

## 2013-11-26 DIAGNOSIS — N390 Urinary tract infection, site not specified: Secondary | ICD-10-CM

## 2013-11-26 LAB — POCT URINALYSIS DIPSTICK
Bilirubin, UA: NEGATIVE
Glucose, UA: NEGATIVE
Nitrite, UA: POSITIVE
Protein, UA: >=300
Spec Grav, UA: >=1.03
UROBILINOGEN UA: 0.2
pH, UA: 6

## 2013-11-26 LAB — POCT UA - MICROSCOPIC ONLY

## 2013-11-26 MED ORDER — CIPROFLOXACIN HCL 250 MG PO TABS: 250.0000 mg | ORAL_TABLET | Freq: Two times a day (BID) | ORAL | Status: DC

## 2013-11-26 NOTE — Patient Instructions (Signed)
Good to see you today!  Thanks for coming in.  Finish all your antibiotics You should feel better in in 2-3 days,  If you get high fever or nausea or vomiting or backpain then come back  For your eye use Visine eye drops twice daily for 3 days only.  If not better by then see your eye doctor

## 2013-11-26 NOTE — Progress Notes (Signed)
   Subjective:    Patient ID: Karen Casey, female    DOB: 02/04/1956, 58 y.o.   MRN: [id number]  HPI  DYSURIA  Pain urinating started 2 days ago. Pain is: just when uurinates Medications tried: no Any antibiotics in the last 30 days: no More than 3 UTIs in the last 12 months: no STD exposure: no  Symptoms Urgency: yes Frequency: yes Blood in urine: yes Pain in back:no Fever: no Vaginal discharge: no Mouth Ulcers: no   Review of Symptoms - see HPI PMH - Smoking status noted.      Review of Systems     Objective:   Physical Exam Alert no acute distress\ No cvat No abdomen tendernes       Assessment & Plan:  UTI - ua consistent with this diagnosis.  No evidence of pyelo.  Treat with 3 d cipro

## 2013-11-28 ENCOUNTER — Other Ambulatory Visit (HOSPITAL_COMMUNITY)
Admission: RE | Admit: 2013-11-28 | Discharge: 2013-11-28 | Disposition: A | Discharge status: Home / Self Care | Payer: No Typology Code available for payment source | Source: Ambulatory Visit | Attending: Family Medicine | Admitting: Family Medicine

## 2013-11-28 ENCOUNTER — Ambulatory Visit (INDEPENDENT_AMBULATORY_CARE_PROVIDER_SITE_OTHER): Payer: No Typology Code available for payment source | Admitting: Family Medicine

## 2013-11-28 ENCOUNTER — Encounter: Payer: Self-pay | Admitting: Family Medicine

## 2013-11-28 VITALS — BP 161/129 | HR 55 | Temp 99.0°F | Wt 184.0 lb

## 2013-11-28 DIAGNOSIS — Z1151 Encounter for screening for human papillomavirus (HPV): Secondary | ICD-10-CM | POA: Insufficient documentation

## 2013-11-28 DIAGNOSIS — Z124 Encounter for screening for malignant neoplasm of cervix: Secondary | ICD-10-CM

## 2013-11-28 DIAGNOSIS — R252 Cramp and spasm: Secondary | ICD-10-CM | POA: Insufficient documentation

## 2013-11-28 DIAGNOSIS — R300 Dysuria: Secondary | ICD-10-CM | POA: Insufficient documentation

## 2013-11-28 DIAGNOSIS — Z01419 Encounter for gynecological examination (general) (routine) without abnormal findings: Secondary | ICD-10-CM | POA: Insufficient documentation

## 2013-11-28 LAB — BASIC METABOLIC PANEL
BUN: 20 mg/dL (ref 6–23)
CO2: 22 mEq/L (ref 19–32)
CREATININE: 0.98 mg/dL (ref 0.50–1.10)
Calcium: 9 mg/dL (ref 8.4–10.5)
Chloride: 104 mEq/L (ref 96–112)
GLUCOSE: 97 mg/dL (ref 70–99)
Potassium: 4.3 mEq/L (ref 3.5–5.3)
Sodium: 137 mEq/L (ref 135–145)

## 2013-11-28 LAB — POCT URINALYSIS DIPSTICK
Bilirubin, UA: NEGATIVE
Glucose, UA: NEGATIVE
KETONES UA: 15
Nitrite, UA: NEGATIVE
PH UA: 6
Protein, UA: 30
Spec Grav, UA: 1.02
Urobilinogen, UA: 0.2

## 2013-11-28 LAB — POCT UA - MICROSCOPIC ONLY

## 2013-11-28 MED ORDER — CEPHALEXIN 500 MG PO CAPS: 500.0000 mg | ORAL_CAPSULE | Freq: Three times a day (TID) | ORAL | Status: DC

## 2013-11-28 NOTE — Progress Notes (Signed)
   Subjective:    Patient ID: Karen Casey, female    DOB: [birth year], 58 y.o.   MRN: [id number]  HPI  Dysuria Still same symptoms of frequency and burning and dribbling.  No vaginal discharge or pain or sores.  Took all the cipro  Cramping  In abdomen and arms and legs comes and goes.  Been having for months.  No nausea or vomiting or bleeding or fever or unusual back pain.  Has gained 40 lbs recently.  Prior history of substance abuse.  No nausea or vomiting or persistent diarrhea  Chief Complaint noted Review of Symptoms - see HPI PMH - Smoking status noted.      Review of Systems     Objective:   Physical Exam Alert no acute distress Abdomen: soft and without masses, organomegaly or hernias noted.  No guarding or rebound  Very mild LLQ tenderness NoCVAT Able to move around exam room and table without cramping        Assessment & Plan:

## 2013-11-28 NOTE — Patient Instructions (Signed)
Sorry you are not better  I think you have a resistant infection  Stop the cipro Take cephalexin three times daily for the next 7 days  If you are not better in 3 days or get any fever or severe pain then come back  We will check your lab for the cramps.  Will call you if they are abnormal  Come back to see Dr Mingo Karen Casey for the cramps and for your blood pressure

## 2013-11-28 NOTE — Assessment & Plan Note (Signed)
Symptoms very consistent with resistant UTI.  Culture and change to keflex.  No lesions on pelvic exam

## 2013-11-28 NOTE — Assessment & Plan Note (Addendum)
Unsure of cause.  No evidence of intraabdominal disease.  Perhaps electrolyte disorder.  No worrisome medications.  Check labs and follow up with PCP

## 2013-11-29 LAB — URINE CULTURE
COLONY COUNT: NO GROWTH
ORGANISM ID, BACTERIA: NO GROWTH

## 2013-11-29 LAB — CYTOLOGY - PAP

## 2013-12-04 ENCOUNTER — Other Ambulatory Visit: Payer: Self-pay | Admitting: *Deleted

## 2013-12-05 MED ORDER — CYCLOBENZAPRINE HCL 10 MG PO TABS
ORAL_TABLET | ORAL | Status: DC
Start: ? — End: 2014-12-24

## 2013-12-05 MED ORDER — HYDROXYZINE HCL 50 MG PO TABS: ORAL_TABLET | ORAL | Status: DC

## 2013-12-05 MED ORDER — CITALOPRAM HYDROBROMIDE 20 MG PO TABS: ORAL_TABLET | ORAL | Status: DC

## 2013-12-05 MED ORDER — ESOMEPRAZOLE MAGNESIUM 40 MG PO CPDR: 40.0000 mg | DELAYED_RELEASE_CAPSULE | Freq: Every day | ORAL | Status: DC

## 2013-12-13 ENCOUNTER — Other Ambulatory Visit: Payer: Self-pay | Admitting: Family Medicine

## 2013-12-13 NOTE — Telephone Encounter (Signed)
Pt called again. She wants to make sure she gets the refill before tomorrow.

## 2013-12-13 NOTE — Telephone Encounter (Signed)
Is out of her hydrocxycine. Has been out for 3 days. Is really feeling anxious. Could she get enough to last for a few days until [facility] gets her the medicine She has an appt on Tuesday

## 2013-12-13 NOTE — Telephone Encounter (Signed)
LVM for patient to call back. ?

## 2013-12-13 NOTE — Telephone Encounter (Signed)
Is she asking for a new prescription through [facility] or that she needs a prescription from a separate pharmacy before the [place] arrives?

## 2013-12-18 ENCOUNTER — Ambulatory Visit (INDEPENDENT_AMBULATORY_CARE_PROVIDER_SITE_OTHER): Payer: No Typology Code available for payment source | Admitting: Family Medicine

## 2013-12-18 ENCOUNTER — Encounter: Payer: Self-pay | Admitting: Family Medicine

## 2013-12-18 VITALS — BP 139/84 | HR 93 | Temp 98.7°F | Ht 63.0 in | Wt 185.0 lb

## 2013-12-18 DIAGNOSIS — I10 Essential (primary) hypertension: Secondary | ICD-10-CM

## 2013-12-18 DIAGNOSIS — S025XXA Fracture of tooth (traumatic), initial encounter for closed fracture: Secondary | ICD-10-CM

## 2013-12-18 DIAGNOSIS — S025XXB Fracture of tooth (traumatic), initial encounter for open fracture: Secondary | ICD-10-CM

## 2013-12-18 DIAGNOSIS — B370 Candidal stomatitis: Secondary | ICD-10-CM

## 2013-12-18 DIAGNOSIS — R252 Cramp and spasm: Secondary | ICD-10-CM

## 2013-12-18 LAB — CBC WITH DIFFERENTIAL/PLATELET
BASOS ABS: 0 10*3/uL (ref 0.0–0.1)
BASOS PCT: 0 % (ref 0–1)
EOS ABS: 0.1 10*3/uL (ref 0.0–0.7)
Eosinophils Relative: 1 % (ref 0–5)
HCT: 42.9 % (ref 36.0–46.0)
HEMOGLOBIN: 15.2 g/dL — AB (ref 12.0–15.0)
Lymphocytes Relative: 22 % (ref 12–46)
Lymphs Abs: 1.6 10*3/uL (ref 0.7–4.0)
MCH: 29.8 pg (ref 26.0–34.0)
MCHC: 35.4 g/dL (ref 30.0–36.0)
MCV: 84.1 fL (ref 78.0–100.0)
MONO ABS: 0.7 10*3/uL (ref 0.1–1.0)
MONOS PCT: 10 % (ref 3–12)
NEUTROS ABS: 5 10*3/uL (ref 1.7–7.7)
NEUTROS PCT: 67 % (ref 43–77)
Platelets: 234 10*3/uL (ref 150–400)
RBC: 5.1 MIL/uL (ref 3.87–5.11)
RDW: 13.5 % (ref 11.5–15.5)
WBC: 7.4 10*3/uL (ref 4.0–10.5)

## 2013-12-18 LAB — COMPREHENSIVE METABOLIC PANEL
ALT: 17 U/L (ref 0–35)
AST: 16 U/L (ref 0–37)
Albumin: 4.1 g/dL (ref 3.5–5.2)
Alkaline Phosphatase: 108 U/L (ref 39–117)
BILIRUBIN TOTAL: 0.3 mg/dL (ref 0.2–1.2)
BUN: 15 mg/dL (ref 6–23)
CO2: 24 meq/L (ref 19–32)
Calcium: 9.5 mg/dL (ref 8.4–10.5)
Chloride: 103 mEq/L (ref 96–112)
Creat: 0.81 mg/dL (ref 0.50–1.10)
Glucose, Bld: 90 mg/dL (ref 70–99)
Potassium: 4.1 mEq/L (ref 3.5–5.3)
SODIUM: 138 meq/L (ref 135–145)
TOTAL PROTEIN: 6.7 g/dL (ref 6.0–8.3)

## 2013-12-18 LAB — LDL CHOLESTEROL, DIRECT: LDL DIRECT: 58 mg/dL

## 2013-12-18 MED ORDER — NYSTATIN 100000 UNIT/ML MT SUSP: 5.0000 mL | Freq: Four times a day (QID) | OROMUCOSAL | Status: DC

## 2013-12-18 NOTE — Patient Instructions (Signed)
Get the dentist to work on that tooth.    I've sent in the medicine for your tongue. Nystatin.

## 2013-12-19 LAB — TSH: TSH: 1.662 u[IU]/mL (ref 0.350–4.500)

## 2013-12-20 ENCOUNTER — Telehealth: Payer: Self-pay | Admitting: Family Medicine

## 2013-12-20 ENCOUNTER — Other Ambulatory Visit: Payer: Self-pay | Admitting: *Deleted

## 2013-12-20 DIAGNOSIS — S025XXA Fracture of tooth (traumatic), initial encounter for closed fracture: Secondary | ICD-10-CM | POA: Insufficient documentation

## 2013-12-20 DIAGNOSIS — B370 Candidal stomatitis: Secondary | ICD-10-CM | POA: Insufficient documentation

## 2013-12-20 MED ORDER — CITALOPRAM HYDROBROMIDE 20 MG PO TABS: ORAL_TABLET | ORAL | Status: DC

## 2013-12-20 NOTE — Telephone Encounter (Signed)
Has decided she does need pain meds  Uses [facility]

## 2013-12-20 NOTE — Progress Notes (Signed)
Subjective:    Karen Casey is a 58 y.o. female who presents to [facility] today for tooth pain and cramps:  1.  Mouth pain:  Has broken tooth bottom left molar.  Sharp point that extends upwards and chronically rubs bottom of her tongue.  Pain here.  Has also noticed "white stuff" on surface of her tongue.  Applying OTC topical pain reliever to tongue, which helps.  Has not yet seen a dentist due to finances.  2.  Cramps:  Both abdominal and UE's.  Has history of hypokalemia in past, not treated.  Last BMP was WNL.  States these often wake her at night from sleep.  States abdominal cramps can occur anywhere in her abdomen, last for a few seconds, then resolve.  No changes in bowel habits.    ROS as above per HPI, otherwise neg.    The following portions of the patient's history were reviewed and updated as appropriate: allergies, current medications, past medical history, family and social history, and problem list. Patient is a nonsmoker.    PMH reviewed.  Past Medical History  Diagnosis Date  . GERD (gastroesophageal reflux disease)   . Anxiety   . Depression   . Urinary incontinence 06/09/2012  . 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    Event occurred in 2007  . Alcohol abuse   . Allergy   . Cataract 06/09/2012    Right eye and left eye  . Seizures     xanax withdrawl- December 2013   Past Surgical History  Procedure Laterality Date  . Appendectomy    . Left shoulder dislocation  Sept 2011  . Cataract extraction  06/09/2012    Left eye    Medications reviewed. Current Outpatient Prescriptions  Medication Sig Dispense Refill  . cephALEXin (KEFLEX) 500 MG capsule Take 1 capsule (500 mg total) by mouth 3 (three) times daily.  21 capsule  0  . citalopram (CELEXA) 10 MG tablet Take 10 mg by mouth daily.      . citalopram (CELEXA) 20 MG tablet TAKE 1 TABLET BY MOUTH AT BEDTIME FOR DEPRESSION  30 tablet  5  . cyclobenzaprine (FLEXERIL) 10 MG tablet TAKE 1 TABLET BY MOUTH BID PRN FOR  MUSCLE SPASMS  30 tablet  2  . esomeprazole (NEXIUM) 40 MG capsule Take 1 capsule (40 mg total) by mouth daily before breakfast.  30 capsule  5  . hydrOXYzine (ATARAX/VISTARIL) 50 MG tablet TAKE 1 TABLET BY MOUTH AT BEDTIME AS NEEDED FOR ANXIETY/SLEEP  30 tablet  2  . ibuprofen (ADVIL,MOTRIN) 200 MG tablet Take 400 mg by mouth every 8 (eight) hours as needed for pain.       Marland Kitchen nystatin (MYCOSTATIN) 100000 UNIT/ML suspension Take 5 mLs (500,000 Units total) by mouth 4 (four) times daily. Swish and spit  240 mL  0  . ondansetron (ZOFRAN) 4 MG tablet Take 1 tablet (4 mg total) by mouth every 6 (six) hours.  12 tablet  0  . sucralfate (CARAFATE) 1 G tablet TAKE 1 TABLET BY MOUTH FOUR TIMES DAILY AS NEEDED  360 tablet  3  . trazodone (DESYREL) 300 MG tablet Take 1 tablet (300 mg total) by mouth at bedtime.  30 tablet  2   No current facility-administered medications for this visit.     Objective:   Physical Exam BP 139/84  Pulse 93  Temp(Src) 98.7 F (37.1 C) (Oral)  Ht 5\' 3"  (1.6 m)  Wt 185 lb (83.915 kg)  BMI 32.78 kg/m2  Gen:  Alert, cooperative patient who appears stated age in no acute distress.  Vital signs reviewed. HEENT: EOMI,  MMM.  White, easily removed film over mostly Right side of tongue consistent with thrush.  Also with abrasion inferior aspect of tongue, no drainage or redness.  Several broken teeth, including one with sharp protrusion 1st molar Right lower mandible.  Cardiac:  Regular rate and rhythm Pulm:  Clear to auscultation bilaterally with good air movement.   Abd:  Soft/nondistended/nontender..  Neuro:  No focal deficits noted, specifically strength 5/5 and sensation 5/5 BL UE's.

## 2013-12-20 NOTE — Assessment & Plan Note (Signed)
Encouraged to see dentist.  Contributing to tongue abrasion.  No signs of infection.

## 2013-12-20 NOTE — Assessment & Plan Note (Signed)
Nystatin to treat.  

## 2013-12-20 NOTE — Assessment & Plan Note (Signed)
No electrolyte abnormalities.  Patient desires recheck.  Will do this.   Unclear etiology.   No abdominal issues.

## 2013-12-20 NOTE — Telephone Encounter (Signed)
Called to relay normal labs.  Can send in Tramadol for patient tomorrow.  Had to leave message for patient to call clinic tomorrow.

## 2013-12-26 ENCOUNTER — Ambulatory Visit (INDEPENDENT_AMBULATORY_CARE_PROVIDER_SITE_OTHER): Payer: No Typology Code available for payment source | Admitting: Family Medicine

## 2013-12-26 ENCOUNTER — Encounter: Payer: Self-pay | Admitting: *Deleted

## 2013-12-26 VITALS — BP 158/97 | HR 90 | Temp 98.6°F | Resp 18 | Wt 184.0 lb

## 2013-12-26 DIAGNOSIS — H9201 Otalgia, right ear: Secondary | ICD-10-CM

## 2013-12-26 DIAGNOSIS — R3915 Urgency of urination: Secondary | ICD-10-CM

## 2013-12-26 DIAGNOSIS — R350 Frequency of micturition: Secondary | ICD-10-CM

## 2013-12-26 DIAGNOSIS — H9209 Otalgia, unspecified ear: Secondary | ICD-10-CM | POA: Insufficient documentation

## 2013-12-26 LAB — POCT URINALYSIS DIPSTICK
Bilirubin, UA: NEGATIVE
Blood, UA: NEGATIVE
Glucose, UA: NEGATIVE
KETONES UA: NEGATIVE
Leukocytes, UA: NEGATIVE
Nitrite, UA: NEGATIVE
PH UA: 7
SPEC GRAV UA: 1.02
Urobilinogen, UA: 0.2

## 2013-12-26 MED ORDER — SOLIFENACIN SUCCINATE 5 MG PO TABS: 5.0000 mg | ORAL_TABLET | Freq: Every day | ORAL | Status: DC

## 2013-12-26 MED ORDER — HYDROXYZINE HCL 50 MG PO TABS
ORAL_TABLET | ORAL | Status: DC
Start: ? — End: 2014-03-22

## 2013-12-26 NOTE — Assessment & Plan Note (Signed)
Prior urine culture negative. Urinalysis today was negative.  Given duration of symptoms and the fact that she's had this for quite some time, I feel that this is more consistent with overactive bladder/urge incontinence.  Will treat with Vesicare and have patient followup primary provider if she fails to improve.

## 2013-12-26 NOTE — Progress Notes (Unsigned)
Prior Authorization received from [place] for Vesicare 5 mg tablet.  PA form placed in provider box for completion. Derl Barrow, RN

## 2013-12-26 NOTE — Telephone Encounter (Signed)
Saw Karen Casey in clinic when she was here for UTI.  Has seen a dentist and doesn't need any pain meds.

## 2013-12-26 NOTE — Assessment & Plan Note (Signed)
Patient has been dealing with dental pain recently and has been experiencing right ear pain.   She wanted me to take a look at her ears. Foreign body was noted and removed (see physical exam).  No other abnormalities noted.   Pain may have been coming from this; however, I sure poor dentition is playing a role.  I gave her a list of dentists and advised her to see one soon.

## 2013-12-26 NOTE — Patient Instructions (Signed)
It was nice to see you today.  There is no evidence of UTI. I feel that your symptoms are more consistent with overactive bladder/urge incontinence.  I am starting you on a once daily medication.  Please follow up with Dr. Mingo Amber.

## 2013-12-26 NOTE — Progress Notes (Signed)
   Subjective:    Patient ID: [name], female    DOB: 06-03-1956, 58 y.o.   MRN: [id number]  HPI 58 year old female presents for same day visit with complaints of urinary urgency.   1) Urinary urgency - Patient was recently seen on 6/15  for dysuria and urinary frequency/urgency.  Urinalysis was consistent with UTI and patient was treated with 3 days of Cipro.  - Patient continue to have symptoms and presented on 6/17.  Urine culture was obtained at that time antibiotic switched to Keflex.   Urine culture later returned revealing no growth. - Patient presents today with continued urinary frequency/urgency.   She denies any dysuria but reports some "fullness".  Upon further questioning, she reports that she has had this for quite some time, even prior to recent office visits.  - ROS: No fevers, chills, nausea, vomiting. She has had a few episodes of incontinence. No reports of vaginal discharge.   Review of Systems Per HPI    Objective:   Physical Exam Filed Vitals:   12/26/13 0927  BP: 158/97  Pulse: 90  Temp: 98.6 F (37 C)  Resp: 18   Exam: General: well appearing female in NAD.  HEENT: TM's normal bilaterally.  Cerumen and white foreign body (appears to be cotton from Q tip) was noted in the right ear.  Removed with ear rake.  Abdomen: soft, nontender, nondistended. No palpable organomegaly.     Assessment & Plan:  See Problem List

## 2013-12-27 MED ORDER — OXYBUTYNIN CHLORIDE ER 5 MG PO TB24: 5.0000 mg | ORAL_TABLET | Freq: Every day | ORAL | Status: DC

## 2013-12-27 NOTE — Addendum Note (Signed)
Addended by: Coral Spikes on: 12/27/2013 01:39 PM   Modules accepted: Orders, Medications

## 2013-12-31 ENCOUNTER — Telehealth: Payer: Self-pay | Admitting: Family Medicine

## 2013-12-31 MED ORDER — OXYBUTYNIN CHLORIDE 5 MG PO TABS: 5.0000 mg | ORAL_TABLET | Freq: Two times a day (BID) | ORAL | Status: DC

## 2013-12-31 NOTE — Telephone Encounter (Signed)
Insurance will not pay for vesicare Would like something else Please advise

## 2013-12-31 NOTE — Telephone Encounter (Signed)
Will attempt twice a day Oxybutynin instead of Long-acting Ditropan.

## 2014-01-01 NOTE — Telephone Encounter (Signed)
Left message on patients voicemail.Karen Casey, Karen Casey

## 2014-01-07 ENCOUNTER — Ambulatory Visit (INDEPENDENT_AMBULATORY_CARE_PROVIDER_SITE_OTHER): Payer: No Typology Code available for payment source | Admitting: Family Medicine

## 2014-01-07 ENCOUNTER — Encounter: Payer: Self-pay | Admitting: Family Medicine

## 2014-01-07 VITALS — BP 133/76 | HR 86 | Temp 99.1°F | Ht 63.0 in | Wt 188.0 lb

## 2014-01-07 DIAGNOSIS — J069 Acute upper respiratory infection, unspecified: Secondary | ICD-10-CM | POA: Insufficient documentation

## 2014-01-07 MED ORDER — ACETAMINOPHEN 500 MG PO TABS: 500.0000 mg | ORAL_TABLET | Freq: Three times a day (TID) | ORAL | Status: DC | PRN

## 2014-01-07 MED ORDER — BENZONATATE 100 MG PO CAPS: 100.0000 mg | ORAL_CAPSULE | Freq: Two times a day (BID) | ORAL | Status: DC | PRN

## 2014-01-07 NOTE — Patient Instructions (Signed)
Karen Casey, it was a pleasure seeing you today. Today we talked about your cold symptoms. I think you have a viral infection. I'm sorry that it is making you feel so badly. I think trying some tylenol will help. I will also prescribe Tessalon for your cough. If you have worsening symptoms, shortness of breath, chest pain, fevers greater than 100.4 that do not respond to tylenol, please call and/or return to be seen  If you have any questions or concerns, please do not hesitate to call the office at [phone].  Sincerely,  Cordelia Poche, MD

## 2014-01-07 NOTE — Assessment & Plan Note (Signed)
Do not suspect bacterial illness especially since it has only been 3 days. Will have patient follow-up if symptoms persist past next week and order antibiotics. For now, tylenol for symptoms. Prescribed tessalon for cough. Patient to continue using Guaifenesin.

## 2014-01-07 NOTE — Progress Notes (Signed)
   Subjective:    Patient ID: Karen Casey, female    DOB: Mar 11, 1956, 58 y.o.   MRN: [id number]  HPI Comments: Same day appointment  URI  This is a new problem. Episode onset: 3 days. There has been no fever. Associated symptoms include congestion, ear pain, headaches, sinus pain and a sore throat. Pertinent negatives include no chest pain, diarrhea, nausea, sneezing, vomiting or wheezing. She has tried NSAIDs and decongestant for the symptoms. The treatment provided mild relief.      Review of Systems  HENT: Positive for congestion, ear pain and sore throat. Negative for sneezing.   Respiratory: Negative for wheezing.   Cardiovascular: Negative for chest pain.  Gastrointestinal: Negative for nausea, vomiting and diarrhea.  Neurological: Positive for headaches.       Objective:  BP 133/76  Pulse 86  Temp(Src) 99.1 F (37.3 C) (Oral)  Ht 5\' 3"  (1.6 m)  Wt 188 lb (85.276 kg)  BMI 33.31 kg/m2  SpO2 97%  Physical Exam  HENT:  Head: Normocephalic.  Right Ear: Tympanic membrane normal.  Left Ear: Tympanic membrane normal.  Nose: Nose normal. Right sinus exhibits no maxillary sinus tenderness and no frontal sinus tenderness. Left sinus exhibits no maxillary sinus tenderness and no frontal sinus tenderness.  Mouth/Throat: Uvula is midline, oropharynx is clear and moist and mucous membranes are normal.  Neck: Normal range of motion.  Lymphadenopathy:    She has no cervical adenopathy.          Assessment & Plan:

## 2014-01-11 ENCOUNTER — Telehealth: Payer: Self-pay | Admitting: Family Medicine

## 2014-01-11 NOTE — Telephone Encounter (Signed)
[facility] [address]   [place] leaving for the beach on Monday and would like to have this antibotic before then Please advise

## 2014-01-11 NOTE — Telephone Encounter (Signed)
Will forward to physician who saw her last.

## 2014-01-11 NOTE — Telephone Encounter (Signed)
Still not feeling better  Would like antibotic Uses outpatient pharmacy

## 2014-01-14 NOTE — Telephone Encounter (Signed)
Message left by patient on 7/31 requesting antibiotic. Called patient to check on symptoms today. She states she is much better today and improved over the weekend. Since I think this is URI caused by a virus, I still think antibiotics are not warranted. Patient understood and agreed with plan.

## 2014-01-22 ENCOUNTER — Encounter: Payer: Self-pay | Admitting: Family Medicine

## 2014-01-22 ENCOUNTER — Other Ambulatory Visit: Payer: Self-pay | Admitting: Family Medicine

## 2014-01-22 ENCOUNTER — Ambulatory Visit (HOSPITAL_COMMUNITY)
Admission: RE | Admit: 2014-01-22 | Discharge: 2014-01-22 | Disposition: A | Discharge status: Home / Self Care | Payer: No Typology Code available for payment source | Source: Ambulatory Visit | Attending: Family Medicine | Admitting: Family Medicine

## 2014-01-22 ENCOUNTER — Ambulatory Visit (INDEPENDENT_AMBULATORY_CARE_PROVIDER_SITE_OTHER): Payer: No Typology Code available for payment source | Admitting: Family Medicine

## 2014-01-22 ENCOUNTER — Telehealth: Payer: Self-pay | Admitting: Family Medicine

## 2014-01-22 VITALS — BP 164/98 | HR 85 | Temp 98.4°F | Ht 63.0 in | Wt 186.1 lb

## 2014-01-22 DIAGNOSIS — J988 Other specified respiratory disorders: Principal | ICD-10-CM

## 2014-01-22 DIAGNOSIS — R059 Cough, unspecified: Secondary | ICD-10-CM | POA: Insufficient documentation

## 2014-01-22 DIAGNOSIS — R05 Cough: Secondary | ICD-10-CM

## 2014-01-22 DIAGNOSIS — Z87891 Personal history of nicotine dependence: Secondary | ICD-10-CM | POA: Insufficient documentation

## 2014-01-22 DIAGNOSIS — J398 Other specified diseases of upper respiratory tract: Secondary | ICD-10-CM | POA: Insufficient documentation

## 2014-01-22 MED ORDER — AMOXICILLIN-POT CLAVULANATE 875-125 MG PO TABS: 1.0000 | ORAL_TABLET | Freq: Two times a day (BID) | ORAL | Status: DC

## 2014-01-22 NOTE — Patient Instructions (Signed)
Smoking Cessation Quitting smoking is important to your health and has many advantages. However, it is not always easy to quit since nicotine is a very addictive drug. Oftentimes, people try 3 times or more before being able to quit. This document explains the best ways for you to prepare to quit smoking. Quitting takes hard work and a lot of effort, but you can do it. ADVANTAGES OF QUITTING SMOKING  You will live longer, feel better, and live better.  Your body will feel the impact of quitting smoking almost immediately.  Within 20 minutes, blood pressure decreases. Your pulse returns to its normal level.  After 8 hours, carbon monoxide levels in the blood return to normal. Your oxygen level increases.  After 24 hours, the chance of having a heart attack starts to decrease. Your breath, hair, and body stop smelling like smoke.  After 48 hours, damaged nerve endings begin to recover. Your sense of taste and smell improve.  After 72 hours, the body is virtually free of nicotine. Your bronchial tubes relax and breathing becomes easier.  After 2 to 12 weeks, lungs can hold more air. Exercise becomes easier and circulation improves.  The risk of having a heart attack, stroke, cancer, or lung disease is greatly reduced.  After 1 year, the risk of coronary heart disease is cut in half.  After 5 years, the risk of stroke falls to the same as a nonsmoker.  After 10 years, the risk of lung cancer is cut in half and the risk of other cancers decreases significantly.  After 15 years, the risk of coronary heart disease drops, usually to the level of a nonsmoker.  If you are pregnant, quitting smoking will improve your chances of having a healthy baby.  The people you live with, especially any children, will be healthier.  You will have extra money to spend on things other than cigarettes. QUESTIONS TO THINK ABOUT BEFORE ATTEMPTING TO QUIT You may want to talk about your answers with your  health care provider.  Why do you want to quit?  If you tried to quit in the past, what helped and what did not?  What will be the most difficult situations for you after you quit? How will you plan to handle them?  Who can help you through the tough times? Your family? Friends? A health care provider?  What pleasures do you get from smoking? What ways can you still get pleasure if you quit? Here are some questions to ask your health care provider:  How can you help me to be successful at quitting?  What medicine do you think would be best for me and how should I take it?  What should I do if I need more help?  What is smoking withdrawal like? How can I get information on withdrawal? GET READY  Set a quit date.  Change your environment by getting rid of all cigarettes, ashtrays, matches, and lighters in your home, car, or work. Do not let people smoke in your home.  Review your past attempts to quit. Think about what worked and what did not. GET SUPPORT AND ENCOURAGEMENT You have a better chance of being successful if you have help. You can get support in many ways.  Tell your family, friends, and coworkers that you are going to quit and need their support. Ask them not to smoke around you.  Get individual, group, or telephone counseling and support. Programs are available at local hospitals and health centers. Call   your local health department for information about programs in your area.  Spiritual beliefs and practices may help some smokers quit.  Download a "quit meter" on your computer to keep track of quit statistics, such as how long you have gone without smoking, cigarettes not smoked, and money saved.  Get a self-help book about quitting smoking and staying off tobacco. [place] yourself from urges to smoke. Talk to someone, go for a walk, or occupy your time with a task.  Change your normal routine. Take a different route to work.  Drink tea instead of coffee. Eat breakfast in a different place.  Reduce your stress. Take a hot bath, exercise, or read a book.  Plan something enjoyable to do every day. Reward yourself for not smoking.  Explore interactive web-based programs that specialize in helping you quit. GET MEDICINE AND USE IT CORRECTLY Medicines can help you stop smoking and decrease the urge to smoke. Combining medicine with the above behavioral methods and support can greatly increase your chances of successfully quitting smoking.  Nicotine replacement therapy helps deliver nicotine to your body without the negative effects and risks of smoking. Nicotine replacement therapy includes nicotine gum, lozenges, inhalers, nasal sprays, and skin patches. Some may be available over-the-counter and others require a prescription.  Antidepressant medicine helps people abstain from smoking, but how this works is unknown. This medicine is available by prescription.  Nicotinic receptor partial agonist medicine simulates the effect of nicotine in your brain. This medicine is available by prescription. Ask your health care provider for advice about which medicines to use and how to use them based on your health history. Your health care provider will tell you what side effects to look out for if you choose to be on a medicine or therapy. Carefully read the information on the package. Do not use any other product containing nicotine while using a nicotine replacement product.  RELAPSE OR DIFFICULT SITUATIONS Most relapses occur within the first 3 months after quitting. Do not be discouraged if you start smoking again. Remember, most people try several times before finally quitting. You may have symptoms of withdrawal because your body is used to nicotine. You may crave cigarettes, be irritable, feel very hungry, cough often, get headaches, or have difficulty concentrating. The withdrawal symptoms are only temporary. They are strongest  when you first quit, but they will go away within 10-14 days. To reduce the chances of relapse, try to:  Avoid drinking alcohol. Drinking lowers your chances of successfully quitting.  Reduce the amount of caffeine you consume. Once you quit smoking, the amount of caffeine in your body increases and can give you symptoms, such as a rapid heartbeat, sweating, and anxiety.  Avoid smokers because they can make you want to smoke.  Do not let weight gain distract you. Many smokers will gain weight when they quit, usually less than 10 pounds. Eat a healthy diet and stay active. You can always lose the weight gained after you quit.  Find ways to improve your mood other than smoking. FOR MORE INFORMATION  www.smokefree.gov  Document Released: 05/25/2001 Document Revised: 10/15/2013 Document Reviewed: 09/09/2011 [facility] Patient Information 2015 [place], [place]. This information is not intended to replace advice given to you by your health care provider. Make sure you discuss any questions you have with your health care provider.  - Please got to hospital to have your xray completed, I will call in abx after xray completed. - In addition  pick up cough perles at pharmacy.  - return in 2 weeks for follow up

## 2014-01-22 NOTE — Assessment & Plan Note (Signed)
Patient sent to [facility] for chest x-ray. Patient has signs and symptoms of acute sinusitis, will rule out pneumonia. Will call in appropriate abx depending on xray result. Likely augmentin if URI alone, or azithro vs levaquin if PNA as well.

## 2014-01-22 NOTE — Progress Notes (Addendum)
   Subjective:    Patient ID: Karen Casey, female    DOB: Mar 30, 1956, 58 y.o.   MRN: [id number]  HPI .Karen Casey is a 58 y.o. female presents to [facility]  Cough: Patient presents to family medicine clinic today for a cough that has been persistent since July 27. She states she is having a thick mucous productive cough, facial pressure, sweats, subjective fever, headaches and fatigue. She reports an increase in these symptoms as of this past Sunday. She is tolerating PO. She denies seasonal allergies and has taking Claritin with no effect. She has taken Mucinex but only 1 times a day. She denies chills, nausea, vomiting or diarrhea. She had a history of pneumonia last summer, which felt similar to current symptoms.  Has medical history of every day smoker.   Review of Systems Per HPI     Objective:   Physical Exam BP 164/98  Pulse 85  Temp(Src) 98.4 F (36.9 C) (Oral)  Ht 5\' 3"  (1.6 m)  Wt 186 lb 1.6 oz (84.414 kg)  BMI 32.97 kg/m2 Gen: Pleasant, Caucasian female, appears fatigued. No acute distress, nontoxic in appearance. Well-developed, well-nourished. HEENT: AT. [place]. Bilateral TM visualized and normal in appearance. Bilateral eyes without injections or icterus. MMM. Bilateral nares without swelling, with mild erythema. Throat without erythema or exudates. Facial pressure over right frontal sinus. CV: RRR, no murmur Chest: Diminished breath sounds bilateral bases, no crackles, wheezes or rales Abd: Soft. NTND. BS present. No Masses palpated.      Assessment & Plan:

## 2014-01-22 NOTE — Telephone Encounter (Signed)
Please call Karen Casey and inform her she did not have signs on pneumonia on CXR. She has an URI infection with Bronchitis, in addition she has acute sinusitis which I will treat her with augmentin. She is to return in 2 weeks for follow up if not improved. Thanks.

## 2014-01-22 NOTE — Telephone Encounter (Signed)
Pt informed of results. [place], [place] CMA

## 2014-02-19 ENCOUNTER — Ambulatory Visit (INDEPENDENT_AMBULATORY_CARE_PROVIDER_SITE_OTHER): Payer: No Typology Code available for payment source | Admitting: Family Medicine

## 2014-02-19 ENCOUNTER — Encounter: Payer: Self-pay | Admitting: Family Medicine

## 2014-02-19 VITALS — BP 147/73 | HR 92 | Temp 98.8°F | Ht 63.0 in | Wt 183.0 lb

## 2014-02-19 DIAGNOSIS — H109 Unspecified conjunctivitis: Secondary | ICD-10-CM

## 2014-02-19 DIAGNOSIS — R300 Dysuria: Secondary | ICD-10-CM

## 2014-02-19 LAB — POCT URINALYSIS DIPSTICK
BILIRUBIN UA: NEGATIVE
Glucose, UA: NEGATIVE
Ketones, UA: NEGATIVE
NITRITE UA: NEGATIVE
Protein, UA: 30
Spec Grav, UA: 1.015
UROBILINOGEN UA: 0.2
pH, UA: 6

## 2014-02-19 LAB — POCT UA - MICROSCOPIC ONLY

## 2014-02-19 MED ORDER — POLYMYXIN B-TRIMETHOPRIM 10000-0.1 UNIT/ML-% OP SOLN: 2.0000 [drp] | OPHTHALMIC | Status: DC

## 2014-02-19 MED ORDER — CEPHALEXIN 500 MG PO CAPS: 500.0000 mg | ORAL_CAPSULE | Freq: Three times a day (TID) | ORAL | Status: DC

## 2014-02-19 NOTE — Progress Notes (Signed)
  Subjective:    Karen Casey is a 58 y.o. female who presents for evaluation of erythema and itching in the left eye. She has noticed the above symptoms for 2 days. Onset was acute. Patient denies blurred vision, discharge, foreign body sensation, photophobia, tearing and visual field deficit. There is a history of not wearing contacts or recent sick contacts.  As well, pt c/o dysuria for the past 2-3 days.  Increased frequency, urgency, but denies fever, chills, sweats, or back pain.  Has Hx of UTI that was tx with keflex successfully   The following portions of the patient's history were reviewed and updated as appropriate: allergies, current medications, past family history, past medical history, past social history, past surgical history and problem list.  Review of Systems Pertinent items are noted in HPI.   Objective:    BP 147/73  Pulse 92  Temp(Src) 98.8 F (37.1 C) (Oral)  Ht 5\' 3"  (1.6 m)  Wt 183 lb (83.008 kg)  BMI 32.43 kg/m2      General: alert, cooperative and appears stated age  Eyes:  + Chemosis and injection of the L eye, visual fields full by controntation, PERRLA, EOMI B/L, no drainage present  Vision: Not performed  Fluorescein:  not done    Abdominal Exam - Soft/NT/ND, NABS, no CVA tenderness  Assessment:    Acute conjunctivitis  UTI   Plan:    Discussed the diagnosis and proper care of conjunctivitis.  Stressed household Nurse, mental health. Antibiotics per orders. Warm compress to eye(s). Local eye care discussed. Analgesics as needed.  Keflex for UTI x 7 days and UCx sent since second UTI in 3 months.

## 2014-02-19 NOTE — Addendum Note (Signed)
Addended by: [place], Yannely Kintzel on: 02/19/2014 12:48 PM   Modules accepted: Orders

## 2014-02-19 NOTE — Patient Instructions (Signed)
Conjunctivitis °Conjunctivitis is commonly called "pink eye." Conjunctivitis can be caused by bacterial or viral infection, allergies, or injuries. There is usually redness of the lining of the eye, itching, discomfort, and sometimes discharge. There may be deposits of matter along the eyelids. A viral infection usually causes a watery discharge, while a bacterial infection causes a yellowish, thick discharge. Pink eye is very contagious and spreads by direct contact. °You may be given antibiotic eyedrops as part of your treatment. Before using your eye medicine, remove all drainage from the eye by washing gently with warm water and cotton balls. Continue to use the medication until you have awakened 2 mornings in a row without discharge from the eye. Do not rub your eye. This increases the irritation and helps spread infection. Use separate towels from other household members. Wash your hands with soap and water before and after touching your eyes. Use cold compresses to reduce pain and sunglasses to relieve irritation from light. Do not wear contact lenses or wear eye makeup until the infection is gone. °SEEK MEDICAL CARE IF:  °· Your symptoms are not better after 3 days of treatment. °· You have increased pain or trouble seeing. °· The outer eyelids become very red or swollen. °Document Released: 07/08/2004 Document Revised: 08/23/2011 Document Reviewed: 05/31/2005 °ExitCare® Patient Information ©2015 [employer]. This information is not intended to replace advice given to you by your health care provider. Make sure you discuss any questions you have with your health care provider. ° °

## 2014-02-20 LAB — URINE CULTURE: Colony Count: 50000

## 2014-02-21 ENCOUNTER — Telehealth: Payer: Self-pay | Admitting: Family Medicine

## 2014-02-21 NOTE — Telephone Encounter (Signed)
Left message on both home and cell voicemail,requesting a return call.Margel Joens, Lewie Loron

## 2014-02-21 NOTE — Telephone Encounter (Signed)
Please advise.Thank you.Karen Casey S  

## 2014-02-21 NOTE — Telephone Encounter (Signed)
Would like test results Also needs drs note since she had pink eye. Please advise

## 2014-02-21 NOTE — Telephone Encounter (Signed)
Please let pt know that her Urine Culture did not grow anything and was negative.  She may stop by the front desk and have them print her office encounter from 02/19/14 if she needs a copy.  Thanks, Tamela Oddi. Awanda Mink, DO of [facility] 02/21/2014, 12:53 PM

## 2014-02-27 ENCOUNTER — Telehealth: Payer: Self-pay | Admitting: Family Medicine

## 2014-02-27 NOTE — Telephone Encounter (Signed)
Pt called because she has a UTI from the antibiotics and would [facility] [address]. jw

## 2014-02-28 MED ORDER — FLUCONAZOLE 150 MG PO TABS: 150.0000 mg | ORAL_TABLET | Freq: Once | ORAL | Status: DC

## 2014-02-28 NOTE — Telephone Encounter (Signed)
Assume she meant a yeast infection?  I will send in a medicine for her.  Would you mind calling?

## 2014-02-28 NOTE — Telephone Encounter (Signed)
LVM for patient to call back. ?

## 2014-03-01 ENCOUNTER — Ambulatory Visit: Payer: Self-pay | Admitting: Family Medicine

## 2014-03-04 NOTE — Telephone Encounter (Signed)
LVM for patient to call back. ?

## 2014-03-21 ENCOUNTER — Other Ambulatory Visit: Payer: Self-pay | Admitting: Family Medicine

## 2014-03-22 ENCOUNTER — Other Ambulatory Visit: Payer: Self-pay | Admitting: *Deleted

## 2014-03-22 ENCOUNTER — Other Ambulatory Visit: Payer: Self-pay | Admitting: Family Medicine

## 2014-03-22 NOTE — Telephone Encounter (Signed)
Patient asking to have [facility]ill [facility] [address]

## 2014-03-25 ENCOUNTER — Other Ambulatory Visit: Payer: Self-pay | Admitting: Family Medicine

## 2014-03-25 MED ORDER — HYDROXYZINE HCL 50 MG PO TABS: ORAL_TABLET | ORAL | Status: DC

## 2014-03-29 ENCOUNTER — Telehealth: Payer: Self-pay | Admitting: Family Medicine

## 2014-03-29 DIAGNOSIS — R300 Dysuria: Secondary | ICD-10-CM

## 2014-03-29 MED ORDER — CEPHALEXIN 500 MG PO CAPS: 500.0000 mg | ORAL_CAPSULE | Freq: Three times a day (TID) | ORAL | Status: DC

## 2014-03-29 NOTE — Telephone Encounter (Signed)
Pt called and has another UTI and would like [facility] called in for [facility] the [facility] [address]. jw

## 2014-03-29 NOTE — Telephone Encounter (Signed)
Will attempt Keflex.  If this recurs, will need to be seen to be evaluated for chronic dysuria.

## 2014-04-12 ENCOUNTER — Ambulatory Visit: Payer: Self-pay | Admitting: Family Medicine

## 2014-05-22 ENCOUNTER — Ambulatory Visit: Payer: No Typology Code available for payment source

## 2014-07-02 ENCOUNTER — Encounter (HOSPITAL_COMMUNITY): Payer: Self-pay | Admitting: Physical Medicine and Rehabilitation

## 2014-07-02 ENCOUNTER — Emergency Department (HOSPITAL_COMMUNITY)
Admission: EM | Admit: 2014-07-02 | Discharge: 2014-07-02 | Disposition: A | Discharge status: Home / Self Care | Payer: No Typology Code available for payment source | Attending: Emergency Medicine | Admitting: Emergency Medicine

## 2014-07-02 DIAGNOSIS — F329 Major depressive disorder, single episode, unspecified: Secondary | ICD-10-CM | POA: Insufficient documentation

## 2014-07-02 DIAGNOSIS — F419 Anxiety disorder, unspecified: Secondary | ICD-10-CM | POA: Diagnosis not present

## 2014-07-02 DIAGNOSIS — Z8669 Personal history of other diseases of the nervous system and sense organs: Secondary | ICD-10-CM | POA: Insufficient documentation

## 2014-07-02 DIAGNOSIS — R079 Chest pain, unspecified: Secondary | ICD-10-CM

## 2014-07-02 DIAGNOSIS — R1013 Epigastric pain: Secondary | ICD-10-CM | POA: Diagnosis not present

## 2014-07-02 DIAGNOSIS — M79601 Pain in right arm: Secondary | ICD-10-CM | POA: Diagnosis not present

## 2014-07-02 DIAGNOSIS — Z9089 Acquired absence of other organs: Secondary | ICD-10-CM | POA: Insufficient documentation

## 2014-07-02 DIAGNOSIS — Z720 Tobacco use: Secondary | ICD-10-CM | POA: Diagnosis not present

## 2014-07-02 DIAGNOSIS — Z79899 Other long term (current) drug therapy: Secondary | ICD-10-CM | POA: Diagnosis not present

## 2014-07-02 DIAGNOSIS — R252 Cramp and spasm: Secondary | ICD-10-CM | POA: Insufficient documentation

## 2014-07-02 DIAGNOSIS — M542 Cervicalgia: Secondary | ICD-10-CM | POA: Diagnosis not present

## 2014-07-02 DIAGNOSIS — Z792 Long term (current) use of antibiotics: Secondary | ICD-10-CM | POA: Insufficient documentation

## 2014-07-02 DIAGNOSIS — M79602 Pain in left arm: Secondary | ICD-10-CM | POA: Diagnosis present

## 2014-07-02 DIAGNOSIS — K219 Gastro-esophageal reflux disease without esophagitis: Secondary | ICD-10-CM | POA: Insufficient documentation

## 2014-07-02 LAB — I-STAT CHEM 8, ED
BUN: 24 mg/dL — ABNORMAL HIGH (ref 6–23)
CALCIUM ION: 1.14 mmol/L (ref 1.12–1.23)
CREATININE: 0.9 mg/dL (ref 0.50–1.10)
Chloride: 104 mEq/L (ref 96–112)
Glucose, Bld: 101 mg/dL — ABNORMAL HIGH (ref 70–99)
HCT: 46 % (ref 36.0–46.0)
Hemoglobin: 15.6 g/dL — ABNORMAL HIGH (ref 12.0–15.0)
Potassium: 4.4 mmol/L (ref 3.5–5.1)
Sodium: 138 mmol/L (ref 135–145)
TCO2: 20 mmol/L (ref 0–100)

## 2014-07-02 LAB — I-STAT TROPONIN, ED: Troponin i, poc: 0 ng/mL (ref 0.00–0.08)

## 2014-07-02 NOTE — ED Provider Notes (Signed)
CSN: [id number]     Arrival date & time 07/02/14  1024 History   First MD Initiated Contact with Patient 07/02/14 1033     Chief Complaint  Patient presents with  . Arm Pain     (Consider location/radiation/quality/duration/timing/severity/associated sxs/prior Treatment) HPI Comments: 59 year old female with a past medical history of anxiety, depression, alcohol abuse, seizures and GERD presenting with bilateral arm cramping, abdominal cramping and neck cramping 6 months. Patient reports at random she will experience cramping in her forearms bilateral, followed by her mid epigastric area and then her neck. States this happened once when she was at her primary care physician's office 6 months ago but they "didn't do anything for me". Denies numbness or tingling of her extremities. Denies fever, chills, nausea or vomiting. Despite triage summary, patient denies chest pain or shortness of breath. Pt is a poor historian.  The history is provided by the patient.    Past Medical History  Diagnosis Date  . GERD (gastroesophageal reflux disease)   . Anxiety   . Depression   . Urinary incontinence 06/09/2012  . 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    Event occurred in 2007  . Alcohol abuse   . Allergy   . Cataract 06/09/2012    Right eye and left eye  . Seizures     xanax withdrawl- December 2013   Past Surgical History  Procedure Laterality Date  . Appendectomy    . Left shoulder dislocation  Sept 2011  . Cataract extraction  06/09/2012    Left eye   Family History  Problem Relation Age of Onset  . Hypertension Mother   . Hyperlipidemia Mother   . Heart disease Father   . Depression Father   . Parkinsonism Father   . Colon cancer Neg Hx   . Esophageal cancer Neg Hx   . Rectal cancer Neg Hx   . Stomach cancer Neg Hx    History  Substance Use Topics  . Smoking status: Current Every Day Smoker -- 0.50 packs/day for 37 years    Types: Cigarettes  . Smokeless tobacco: Never Used   . Alcohol Use: 1.8 oz/week    3 Cans of beer per week   OB History    No data available     Review of Systems  Musculoskeletal: Positive for myalgias (cramping).  All other systems reviewed and are negative.     Allergies  Review of patient's allergies indicates no known allergies.  Home Medications   Prior to Admission medications   Medication Sig Start Date End Date Taking? Authorizing Provider  citalopram (CELEXA) 10 MG tablet Take 10 mg by mouth daily.   Yes Historical Provider, MD  acetaminophen (TYLENOL) 500 MG tablet Take 1-2 tablets (500-1,000 mg total) by mouth every 8 (eight) hours as needed. 01/07/14   Cordelia Poche, MD  benzonatate (TESSALON) 100 MG capsule Take 1 capsule (100 mg total) by mouth 2 (two) times daily as needed for cough. 01/07/14   Cordelia Poche, MD  cephALEXin (KEFLEX) 500 MG capsule Take 1 capsule (500 mg total) by mouth 3 (three) times daily. 03/29/14   Alveda Reasons, MD  citalopram (CELEXA) 20 MG tablet TAKE 1 TABLET BY MOUTH AT BEDTIME FOR DEPRESSION 12/20/13   Alveda Reasons, MD  cyclobenzaprine (FLEXERIL) 10 MG tablet TAKE 1 TABLET BY MOUTH BID PRN FOR MUSCLE SPASMS    Alveda Reasons, MD  cyclobenzaprine (FLEXERIL) 10 MG tablet TAKE 1 TABLET BY MOUTH 3 TIMES DAILY AS NEEDED FOR  MUSCLE SPASMS 03/21/14   Alveda Reasons, MD  esomeprazole (NEXIUM) 40 MG capsule Take 1 capsule (40 mg total) by mouth daily before breakfast.    Alveda Reasons, MD  fluconazole (DIFLUCAN) 150 MG tablet Take 1 tablet (150 mg total) by mouth once. 02/28/14   Alveda Reasons, MD  hydrOXYzine (ATARAX/VISTARIL) 50 MG tablet TAKE 1 TABLET BY MOUTH AT BEDTIME AS NEEDED FOR ANXIETY/SLEEP 03/25/14   Alveda Reasons, MD  hydrOXYzine (ATARAX/VISTARIL) 50 MG tablet TAKE 1 TABLET BY MOUTH AT BEDTIME AS NEEDED FOR ANXIETY/SLEEP 03/25/14   Alveda Reasons, MD  ibuprofen (ADVIL,MOTRIN) 200 MG tablet Take 400 mg by mouth every 8 (eight) hours as needed for pain.     Historical  Provider, MD  nystatin (MYCOSTATIN) 100000 UNIT/ML suspension Take 5 mLs (500,000 Units total) by mouth 4 (four) times daily. Swish and spit 12/18/13   Alveda Reasons, MD  ondansetron (ZOFRAN) 4 MG tablet Take 1 tablet (4 mg total) by mouth every 6 (six) hours. 03/09/13   Antonietta Breach, PA-C  oxybutynin (DITROPAN) 5 MG tablet TAKE 1 TABLET (5 MG TOTAL) BY MOUTH 2 (TWO) TIMES DAILY. 03/25/14   Alveda Reasons, MD  sucralfate (CARAFATE) 1 G tablet TAKE 1 TABLET BY MOUTH FOUR TIMES DAILY AS NEEDED 11/08/13   Inda Castle, MD  trazodone (DESYREL) 300 MG tablet Take 1 tablet (300 mg total) by mouth at bedtime. 10/05/12   Alveda Reasons, MD  trimethoprim-polymyxin b (POLYTRIM) ophthalmic solution Place 2 drops into both eyes every 4 (four) hours. 02/19/14   Bryan R Massai Hankerson, DO   BP 125/78 mmHg  Pulse 72  Temp(Src) 97.5 F (36.4 C) (Oral)  Resp 13  Ht 5\' 6"  (1.676 m)  Wt 185 lb (83.915 kg)  BMI 29.87 kg/m2  SpO2 98% Physical Exam  Constitutional: She is oriented to person, place, and time. She appears well-developed and well-nourished. No distress.  HENT:  Head: Normocephalic and atraumatic.  Mouth/Throat: Oropharynx is clear and moist.  Eyes: Conjunctivae and EOM are normal. Pupils are equal, round, and reactive to light.  Neck: Normal range of motion. Neck supple. No JVD present.  Cardiovascular: Normal rate, regular rhythm, normal heart sounds and intact distal pulses.   Pulmonary/Chest: Effort normal and breath sounds normal. No respiratory distress.  Abdominal: Soft. Bowel sounds are normal. There is no tenderness.  Musculoskeletal: Normal range of motion. She exhibits no edema.  FROM all four extremities, no tenderness, color change or swelling. Cannot reproduce pain. C-spine FROM, no spinous process or muscular tenderness.  Neurological: She is alert and oriented to person, place, and time. She has normal strength. No sensory deficit.  Speech fluent, goal oriented. Moves limbs without  ataxia. Equal grip strength bilateral.  Skin: Skin is warm and dry. She is not diaphoretic.  Psychiatric: She has a normal mood and affect. Her behavior is normal.  Nursing note and vitals reviewed.   ED Course  Procedures (including critical care time) Labs Review Labs Reviewed  I-STAT CHEM 8, ED - Abnormal; Notable for the following:    BUN 24 (*)    Glucose, Bld 101 (*)    Hemoglobin 15.6 (*)    All other components within normal limits  I-STAT TROPOININ, ED    Imaging Review No results found.   EKG Interpretation None      MDM   Final diagnoses:  Muscle cramping   Patient in no apparent distress. Afebrile, vital signs stable. It is noted  that the triage note mentioned chest pain or shortness of breath, however patient denying these symptoms during my examination. I asked multiple times, and she continues to deny. Symptoms have been ongoing for about 6 months. Chem-8 without any acute findings. Electrolytes within normal limits. Blood glucose 101. Possibly from alcohol abuse. I advised her to take ibuprofen or Tylenol for her symptoms. Follow-up with PCP. Stable for discharge. Return precautions given. Patient states understanding of treatment care plan and is agreeable.   Carman Ching, PA-C 07/02/14 [place], PA-C 07/02/14 1157  Leota Jacobsen, MD 07/03/14 1009

## 2014-07-02 NOTE — Discharge Instructions (Signed)
You may take ibuprofen or Tylenol if you develop the cramping. Follow-up with your primary care physician. Muscle Cramps and Spasms Muscle cramps and spasms occur when a muscle or muscles tighten and you have no control over this tightening (involuntary muscle contraction). They are a common problem and can develop in any muscle. The most common place is in the calf muscles of the leg. Both muscle cramps and muscle spasms are involuntary muscle contractions, but they also have differences:   Muscle cramps are sporadic and painful. They may last a few seconds to a quarter of an hour. Muscle cramps are often more forceful and last longer than muscle spasms.  Muscle spasms may or may not be painful. They may also last just a few seconds or much longer. CAUSES  It is uncommon for cramps or spasms to be due to a serious underlying problem. In many cases, the cause of cramps or spasms is unknown. Some common causes are:   Overexertion.   Overuse from repetitive motions (doing the same thing over and over).   Remaining in a certain position for a long period of time.   Improper preparation, form, or technique while performing a sport or activity.   Dehydration.   Injury.   Side effects of some medicines.   Abnormally low levels of the salts and ions in your blood (electrolytes), especially potassium and calcium. This could happen if you are taking water pills (diuretics) or you are pregnant.  Some underlying medical problems can make it more likely to develop cramps or spasms. These include, but are not limited to:   Diabetes.   Parkinson disease.   Hormone disorders, such as thyroid problems.   Alcohol abuse.   Diseases specific to muscles, joints, and bones.   Blood vessel disease where not enough blood is getting to the muscles.  HOME CARE INSTRUCTIONS   Stay well hydrated. Drink enough water and fluids to keep your urine clear or pale yellow.  It may be helpful to  massage, stretch, and relax the affected muscle.  For tight or tense muscles, use a warm towel, heating pad, or hot shower water directed to the affected area.  If you are sore or have pain after a cramp or spasm, applying ice to the affected area may relieve discomfort.  Put ice in a plastic bag.  Place a towel between your skin and the bag.  Leave the ice on for 15-20 minutes, 03-04 times a day.  Medicines used to treat a known cause of cramps or spasms may help reduce their frequency or severity. Only take over-the-counter or prescription medicines as directed by your caregiver. SEEK MEDICAL CARE IF:  Your cramps or spasms get more severe, more frequent, or do not improve over time.  MAKE SURE YOU:   Understand these instructions.  Will watch your condition.  Will get help right away if you are not doing well or get worse. Document Released: 11/20/2001 Document Revised: 09/25/2012 Document Reviewed: 05/17/2012 [facility] Patient Information 2015 [place], [place]. This information is not intended to replace advice given to you by your health care provider. Make sure you discuss any questions you have with your health care provider.

## 2014-07-02 NOTE — ED Notes (Signed)
Pt presents to department for evaluation of L sided chest pain, R arm and neck/back pain. Onset today. Also reports SOB. Denies chest pain upon arrival. Respirations unlabored.

## 2014-07-03 ENCOUNTER — Other Ambulatory Visit: Payer: Self-pay | Admitting: Gastroenterology

## 2014-07-12 ENCOUNTER — Ambulatory Visit: Payer: Self-pay | Admitting: Family Medicine

## 2014-07-17 ENCOUNTER — Other Ambulatory Visit: Payer: Self-pay | Admitting: Family Medicine

## 2014-08-02 ENCOUNTER — Other Ambulatory Visit: Payer: Self-pay | Admitting: Gastroenterology

## 2014-08-16 ENCOUNTER — Ambulatory Visit: Payer: Self-pay | Admitting: Family Medicine

## 2014-09-03 ENCOUNTER — Ambulatory Visit: Payer: Self-pay | Admitting: Family Medicine

## 2014-09-06 ENCOUNTER — Other Ambulatory Visit: Payer: Self-pay | Admitting: Gastroenterology

## 2014-09-20 ENCOUNTER — Other Ambulatory Visit: Payer: Self-pay | Admitting: Family Medicine

## 2014-10-14 ENCOUNTER — Ambulatory Visit: Payer: Self-pay | Admitting: Gastroenterology

## 2014-10-17 ENCOUNTER — Other Ambulatory Visit: Payer: Self-pay | Admitting: Family Medicine

## 2014-10-29 ENCOUNTER — Telehealth: Payer: Self-pay | Admitting: Family Medicine

## 2014-10-29 ENCOUNTER — Other Ambulatory Visit: Payer: Self-pay | Admitting: Family Medicine

## 2014-10-29 NOTE — Telephone Encounter (Signed)
Patient is calling once more to check on the status of this medication, Karen Casey, ASA

## 2014-10-29 NOTE — Telephone Encounter (Signed)
Pt called and needs a refill on her Sucralfate called in. jw

## 2014-10-30 ENCOUNTER — Ambulatory Visit: Payer: Self-pay | Admitting: Gastroenterology

## 2014-10-30 MED ORDER — SUCRALFATE 1 G PO TABS: ORAL_TABLET | ORAL | Status: DC

## 2014-10-30 NOTE — Telephone Encounter (Signed)
Pt informed. Deseree Blount, CMA  

## 2014-10-30 NOTE — Telephone Encounter (Signed)
Pt is aware that her medication was called into pharmacy.  See refill note from 10/30/2014.  Maizy Davanzo,CMA

## 2014-10-30 NOTE — Telephone Encounter (Signed)
Pt called again about her sucralfate. It was prescribed by Dr Deatra Ina. She says she was told he would not refill it until she came in for an appt She cannot afford an appt with him now Husband has cancer and all her money goes to that now Please advise

## 2014-12-24 ENCOUNTER — Ambulatory Visit (INDEPENDENT_AMBULATORY_CARE_PROVIDER_SITE_OTHER): Payer: 59 | Admitting: Family Medicine

## 2014-12-24 ENCOUNTER — Encounter: Payer: Self-pay | Admitting: Family Medicine

## 2014-12-24 ENCOUNTER — Other Ambulatory Visit: Payer: Self-pay | Admitting: Family Medicine

## 2014-12-24 VITALS — BP 142/129 | HR 78 | Temp 98.4°F | Ht 66.0 in | Wt 188.1 lb

## 2014-12-24 DIAGNOSIS — F329 Major depressive disorder, single episode, unspecified: Secondary | ICD-10-CM

## 2014-12-24 DIAGNOSIS — R8290 Unspecified abnormal findings in urine: Secondary | ICD-10-CM

## 2014-12-24 DIAGNOSIS — E669 Obesity, unspecified: Secondary | ICD-10-CM

## 2014-12-24 DIAGNOSIS — M545 Low back pain: Secondary | ICD-10-CM

## 2014-12-24 DIAGNOSIS — F32A Depression, unspecified: Secondary | ICD-10-CM

## 2014-12-24 DIAGNOSIS — R300 Dysuria: Secondary | ICD-10-CM

## 2014-12-24 DIAGNOSIS — F1029 Alcohol dependence with unspecified alcohol-induced disorder: Secondary | ICD-10-CM

## 2014-12-24 LAB — POCT URINALYSIS DIPSTICK
BILIRUBIN UA: NEGATIVE
Blood, UA: NEGATIVE
Glucose, UA: NEGATIVE
Ketones, UA: NEGATIVE
Nitrite, UA: NEGATIVE
Protein, UA: NEGATIVE
Spec Grav, UA: 1.015
UROBILINOGEN UA: 0.2
pH, UA: 6

## 2014-12-24 LAB — POCT UA - MICROSCOPIC ONLY

## 2014-12-24 MED ORDER — CYCLOBENZAPRINE HCL 10 MG PO TABS: ORAL_TABLET | ORAL | Status: DC

## 2014-12-24 MED ORDER — CEPHALEXIN 500 MG PO CAPS: 500.0000 mg | ORAL_CAPSULE | Freq: Two times a day (BID) | ORAL | Status: DC

## 2014-12-24 NOTE — Patient Instructions (Signed)
I have sent in the Keflex for your urine.  I sent in the [place] for your back.  I'm sorry about your husband.  Let me know how things are going with that.   It was good to see you again today.  Come back to see me after the surgery

## 2014-12-24 NOTE — Assessment & Plan Note (Signed)
Resumed. Currently controlled. Counseled to seek help. FU in 4 weeks to assess for how she's doing with this and husband's CA diagnosis.

## 2014-12-24 NOTE — Assessment & Plan Note (Signed)
Husband for nephrectomy August 11.   FU with me after that to see how she's doing.

## 2014-12-24 NOTE — Assessment & Plan Note (Signed)
Treating as UTI based on symptoms and leuks on UA. Keflex.

## 2014-12-24 NOTE — Progress Notes (Signed)
Subjective:    Karen Casey is a 59 y.o. female who presents to [facility] today:  1.  Dysuria:  Started about 1 month ago.  Accompanied by urgency and foul odor.  Awakens her from sleep.  No fevers or chills.  No vaginal discharge.   2.  Back pain:  Noted at both beginning and end of day.  States better when being active.  Describes as BL lumbar "soreness."  Most of day is spent lifting at work.  No incontinence or saddle anesthesia.   Of note, husband has learned he has renal cancer.  Both he and she are having difficulty dealing with this.  She tells me she has started drinking again, 2-3 beers a day.     The following portions of the patient's history were reviewed and updated as appropriate: allergies, current medications, past medical history, family and social history, and problem list. Patient is a current smoker.    PMH reviewed.  Past Medical History  Diagnosis Date  . GERD (gastroesophageal reflux disease)   . Anxiety   . Depression   . Urinary incontinence 06/09/2012  . 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    Event occurred in 2007  . Alcohol abuse   . Allergy   . Cataract 06/09/2012    Right eye and left eye  . Seizures     xanax withdrawl- December 2013   Past Surgical History  Procedure Laterality Date  . Appendectomy    . Left shoulder dislocation  Sept 2011  . Cataract extraction  06/09/2012    Left eye    Medications reviewed. Current Outpatient Prescriptions  Medication Sig Dispense Refill  . acetaminophen (TYLENOL) 500 MG tablet Take 1-2 tablets (500-1,000 mg total) by mouth every 8 (eight) hours as needed. (Patient not taking: Reported on 07/02/2014) 60 tablet 0  . benzonatate (TESSALON) 100 MG capsule Take 1 capsule (100 mg total) by mouth 2 (two) times daily as needed for cough. (Patient not taking: Reported on 07/02/2014) 20 capsule 0  . cephALEXin (KEFLEX) 500 MG capsule Take 1 capsule (500 mg total) by mouth 2 (two) times daily. 20 capsule 0  . citalopram  (CELEXA) 10 MG tablet Take 10 mg by mouth daily.    . citalopram (CELEXA) 20 MG tablet TAKE 1 TABLET BY MOUTH AT BEDTIME FOR DEPRESSION (Patient not taking: Reported on 07/02/2014) 30 tablet 5  . cyclobenzaprine (FLEXERIL) 10 MG tablet TAKE 1 TABLET daily prn for muscle spasms 30 tablet 2  . esomeprazole (NEXIUM) 40 MG capsule Take 1 capsule (40 mg total) by mouth daily before breakfast. 30 capsule 5  . fluconazole (DIFLUCAN) 150 MG tablet Take 1 tablet (150 mg total) by mouth once. (Patient not taking: Reported on 07/02/2014) 1 tablet 0  . hydrOXYzine (ATARAX/VISTARIL) 50 MG tablet TAKE 1 TABLET BY MOUTH AT BEDTIME AS NEEDED FOR ANXIETY/SLEEP (Patient not taking: Reported on 07/02/2014) 30 tablet 3  . hydrOXYzine (ATARAX/VISTARIL) 50 MG tablet TAKE 1 TABLET BY MOUTH AT BEDTIME AS NEEDED FOR ANXIETY/SLEEP (Patient not taking: Reported on 07/02/2014) 30 tablet 2  . hydrOXYzine (VISTARIL) 50 MG capsule Take 50 mg by mouth 2 (two) times daily.    Marland Kitchen ibuprofen (ADVIL,MOTRIN) 200 MG tablet Take 400 mg by mouth every 8 (eight) hours as needed for pain.     Marland Kitchen LORazepam (ATIVAN) 1 MG tablet Take 1 mg by mouth 3 (three) times daily.    Marland Kitchen nystatin (MYCOSTATIN) 100000 UNIT/ML suspension Take 5 mLs (500,000 Units total) by mouth  4 (four) times daily. Swish and spit (Patient not taking: Reported on 07/02/2014) 240 mL 0  . ondansetron (ZOFRAN) 4 MG tablet Take 1 tablet (4 mg total) by mouth every 6 (six) hours. (Patient not taking: Reported on 07/02/2014) 12 tablet 0  . oxybutynin (DITROPAN) 5 MG tablet TAKE 1 TABLET BY MOUTH TWICE A DAY 60 tablet 3  . oxybutynin (DITROPAN) 5 MG tablet TAKE 1 TABLET BY MOUTH TWICE A DAY 60 tablet 1  . sucralfate (CARAFATE) 1 G tablet TAKE 1 TABLET BY MOUTH 4 TIMES A DAY AS NEEDED 120 tablet 1  . trazodone (DESYREL) 300 MG tablet Take 1 tablet (300 mg total) by mouth at bedtime. (Patient not taking: Reported on 07/02/2014) 30 tablet 2  . trimethoprim-polymyxin b (POLYTRIM) ophthalmic  solution Place 2 drops into both eyes every 4 (four) hours. (Patient not taking: Reported on 07/02/2014) 10 mL 1   No current facility-administered medications for this visit.     Objective:   Physical Exam BP 142/129 mmHg  Pulse 78  Temp(Src) 98.4 F (36.9 C) (Oral)  Ht 5\' 6"  (1.676 m)  Wt 188 lb 1.6 oz (85.322 kg)  BMI 30.37 kg/m2 Gen:  Alert, cooperative patient who appears stated age in no acute distress.  Vital signs reviewed. HEENT: EOMI,  MMM Cardiac:  Regular rate and rhythm without murmur auscultated.  Good S1/S2. Pulm:  Good air movement.  Scattered rhonchi throughout. Back:  Normal skin, Spine with normal alignment and no deformity.  No tenderness to vertebral process palpation.  Paraspinous muscles are not tender and without spasm.   Range of motion is full at neck and lumbar sacral regions.  Straight leg raise is positive BL for ipsilateral pain.  Neuro:  Sensation and motor function 5/5 bilateral lower extremities.  Patellar and Achilles  DTR's +2 patellar BL.   Psych:  Tearful when discussing husband.  No SI/HI.

## 2014-12-24 NOTE — Assessment & Plan Note (Signed)
No red flags. Did well previously on flexeril. dicussed weight loss -- she is trying.  Refill for flexeril.  Increase activity.

## 2014-12-24 NOTE — Assessment & Plan Note (Signed)
Still present.  Counseled on increase in activity.  Alcohol consumption likely playing a role as well -- drinks beer.

## 2015-01-15 ENCOUNTER — Ambulatory Visit (INDEPENDENT_AMBULATORY_CARE_PROVIDER_SITE_OTHER): Payer: 59 | Admitting: Family Medicine

## 2015-01-15 VITALS — BP 138/82 | HR 80 | Temp 98.1°F | Ht 66.0 in | Wt 185.6 lb

## 2015-01-15 DIAGNOSIS — H109 Unspecified conjunctivitis: Secondary | ICD-10-CM | POA: Insufficient documentation

## 2015-01-15 DIAGNOSIS — A499 Bacterial infection, unspecified: Secondary | ICD-10-CM

## 2015-01-15 DIAGNOSIS — H1089 Other conjunctivitis: Secondary | ICD-10-CM | POA: Diagnosis not present

## 2015-01-15 NOTE — Patient Instructions (Signed)

## 2015-01-15 NOTE — Progress Notes (Signed)
Subjective:     Patient ID: Karen Casey, female   DOB: 1955/06/16, 59 y.o.   MRN: [id number]  HPI Karen Casey is a 59yo female presenting today for possible pink eye. - Notes symptoms of injection, itchiness, and brown discharge of left eye - Symptoms occurred Monday and Tuesday (8/1-8/2) - Improving. Redness is significantly improved and discharge has resolved - Denies changes in vision, eye pain, or fevers - Has been using Erythromycin ointment she had at home, which seems to be working well - Needs note for work stating when she may return to work  The following portions of the patient's history were reviewed and updated as appropriate: allergies, current medications, past family history, past medical history, past social history, past surgical history and problem list.  Review of Systems  Constitutional: Negative for fever.  Eyes: Positive for redness and itching. Negative for photophobia, pain, discharge and visual disturbance.       Objective:   Physical Exam  Constitutional: She appears well-developed and well-nourished. No distress.  Eyes:  PERRLA, moderate injection of left eye, no discharge noted, EOM intact  Cardiovascular: Normal rate and regular rhythm.  Exam reveals no gallop and no friction rub.   No murmur heard. Pulmonary/Chest: Effort normal. No respiratory distress. She has no wheezes. She has no rales.  Psychiatric: She has a normal mood and affect. Her behavior is normal.      Assessment:     Please refer to Problem List for Assessment.    Plan:     Please refer to Problem List for Plan.

## 2015-01-15 NOTE — Assessment & Plan Note (Signed)
-   Appears to be resolving with current treatment. Continue. - Work note given. May return to work on 8/4 given >24hr with treatment and no current discharge - Return if no improvement

## 2015-01-28 ENCOUNTER — Ambulatory Visit: Payer: Self-pay | Admitting: Family Medicine

## 2015-02-09 ENCOUNTER — Other Ambulatory Visit: Payer: Self-pay | Admitting: Family Medicine

## 2015-02-18 ENCOUNTER — Other Ambulatory Visit: Payer: Self-pay | Admitting: Family Medicine

## 2015-03-11 ENCOUNTER — Other Ambulatory Visit: Payer: Self-pay | Admitting: Family Medicine

## 2015-06-10 ENCOUNTER — Other Ambulatory Visit: Payer: Self-pay | Admitting: Family Medicine

## 2015-06-13 ENCOUNTER — Other Ambulatory Visit: Payer: Self-pay | Admitting: Family Medicine

## 2015-06-26 ENCOUNTER — Other Ambulatory Visit: Payer: Self-pay | Admitting: Family Medicine

## 2015-07-01 ENCOUNTER — Ambulatory Visit (INDEPENDENT_AMBULATORY_CARE_PROVIDER_SITE_OTHER): Payer: BLUE CROSS/BLUE SHIELD | Admitting: Family Medicine

## 2015-07-01 ENCOUNTER — Encounter: Payer: Self-pay | Admitting: Family Medicine

## 2015-07-01 VITALS — BP 149/86 | HR 82 | Temp 97.9°F | Wt 182.0 lb

## 2015-07-01 DIAGNOSIS — B9789 Other viral agents as the cause of diseases classified elsewhere: Principal | ICD-10-CM

## 2015-07-01 DIAGNOSIS — J069 Acute upper respiratory infection, unspecified: Secondary | ICD-10-CM | POA: Diagnosis not present

## 2015-07-01 MED ORDER — ESOMEPRAZOLE MAGNESIUM 40 MG PO CPDR: 40.0000 mg | DELAYED_RELEASE_CAPSULE | Freq: Every day | ORAL | Status: DC

## 2015-07-01 NOTE — Patient Instructions (Signed)

## 2015-07-01 NOTE — Assessment & Plan Note (Signed)
Symptoms and time course consistent with viral URI Discussed natural course and symptomatic management Discussed return precautions

## 2015-07-01 NOTE — Progress Notes (Signed)
   Subjective:   Karen Casey is a 60 y.o. female here for same day appointment for sore throat  Patient reports 4 days of sore throat, ear tingling, cough, postnasal drip, headaches, body aches, diarrhea. She reports a lot of people been sick at work recently with similar symptoms. She reports cough is occasionally productive of "snot" that feels like it drained down throat first. Diarrhea is loose and nonbloody. She denies any fevers, vomiting, abdominal pain, dysuria, shortness of breath. She is tried Mucinex and cough drops which help some.  Review of Systems:  Per HPI. All other systems reviewed and are negative.   PMH, PSH, Medications, Allergies, and FmHx reviewed and updated in EMR.  Social History: current smoker - 0.5 PPD  Objective:  BP 149/86 mmHg  Pulse 82  Temp(Src) 97.9 F (36.6 C) (Oral)  Wt 182 lb (82.555 kg)  SpO2 97%  Gen:  60 y.o. female in NAD HEENT: NCAT, MMM, EOMI, PERRL, anicteric sclerae, OP mildly erythematous without exudate, TMs clear bilaterally CV: RRR, no MRG Resp: Non-labored, CTAB, no wheezes noted Abd: Soft, NTND, BS present, no guarding or organomegaly Ext: WWP, no edema MSK: No obvious deformities Neuro: Alert and oriented, speech normal     Assessment & Plan:     Karen Casey is a 60 y.o. female here for URI symptoms.  Viral URI with cough Symptoms and time course consistent with viral URI Discussed natural course and symptomatic management Discussed return precautions     Virginia Crews, MD MPH PGY-2,  [facility] 07/01/2015  2:16 PM

## 2015-08-12 ENCOUNTER — Telehealth: Payer: Self-pay | Admitting: Family Medicine

## 2015-08-14 ENCOUNTER — Other Ambulatory Visit: Payer: Self-pay | Admitting: Family Medicine

## 2015-08-22 ENCOUNTER — Encounter: Payer: Self-pay | Admitting: Family Medicine

## 2015-08-22 ENCOUNTER — Ambulatory Visit (INDEPENDENT_AMBULATORY_CARE_PROVIDER_SITE_OTHER): Payer: BLUE CROSS/BLUE SHIELD | Admitting: Family Medicine

## 2015-08-22 ENCOUNTER — Telehealth: Payer: Self-pay | Admitting: Family Medicine

## 2015-08-22 VITALS — BP 144/81 | HR 79 | Temp 98.0°F | Ht 66.0 in | Wt 186.9 lb

## 2015-08-22 DIAGNOSIS — F32A Depression, unspecified: Secondary | ICD-10-CM

## 2015-08-22 DIAGNOSIS — F329 Major depressive disorder, single episode, unspecified: Secondary | ICD-10-CM

## 2015-08-22 DIAGNOSIS — F1029 Alcohol dependence with unspecified alcohol-induced disorder: Secondary | ICD-10-CM

## 2015-08-22 MED ORDER — NALTREXONE HCL 50 MG PO TABS: 50.0000 mg | ORAL_TABLET | Freq: Every day | ORAL | Status: DC

## 2015-08-22 NOTE — Patient Instructions (Signed)
Take the Naltrexone once a day.  This is a medicine to reduce the craving.    Come back and see me next week or early the week after so we know you're doing well.  I'll fill out your paperwork.   If something happens sooner, call!

## 2015-08-22 NOTE — Assessment & Plan Note (Addendum)
Recurrence of this.  Situational around work. She continues to see her psychiatrist. She is on Celexa daily for this. I have added this back to her medication list. Right now she is not interested in receiving regular counseling but is interested in medical health consultation today and possibly in the future as part of these visits. She did meet with behavioral health consult today. Continue Celexa. Continue following with psychiatrist.  No suicidal or homicidal ideation. Follow-up in 1-2 weeks to assess how she is continuing to do.

## 2015-08-22 NOTE — Assessment & Plan Note (Signed)
She has started drinking in the morning the skin. We will start naltrexone once daily to see if this helps with her cravings. She also met with the behavioral health consultant to determine other routines to help break craving for drinking. She is not currently interested in outpatient nor inpatient treatment but is open to this if drinking does not improve. FU in 1-2 weeks.

## 2015-08-22 NOTE — Telephone Encounter (Signed)
Pt called back because she gave the doctor forms to fill out for her employer on when she can return to work. She needs the date to return to show 08/24/15 since she goes back on Sunday. She also needs these faxed today so that she can return to work. jw

## 2015-08-22 NOTE — Progress Notes (Signed)
Dr. Mingo Amber requested a [place].   Presenting Issue: Karen Casey presents with alcohol addiction and symptoms of depression.  Report of symptoms: Patient reported heavy alcohol use, depressed mood, anhedonia, and lack of energy). Patient reports that she has withdrawal symptoms after a few hours of not drinking (has withdrawal symptoms when she wakes up in the morning).  Duration of CURRENT symptoms: Patient has experienced depressive symptoms for approximately one month.  Age of onset of first mood disturbance: Not assessed  Impact on function: Patient is intoxicated throughout the day.  Psychiatric History - Diagnoses: Alcohol addiction; depression. - Hospitalizations:Patient has been hospitalized in [facility] twice for addiction to Xanax and alcohol.  Family history of psychiatric issues: Not assessed; patient reports that her husband drinks fairly heavily, but only in the evenings.  Current and history of substance use: Patient is currently consuming [id number] alcoholic beverages per day, beginning first thing in the morning.  Medical conditions that might explain or contribute to symptoms: None  Assessment / Plan / Recommendations:Patient reported that she increased her alcohol consumption from 3 drinks per day in the evening to 8-10 drinks per day approximately three weeks ago. She attributes this behavior to stress resulting from a mandatory change in departments and decreased hours at her job. Patient purchase a 24 pack of beer every day, which she and her husband mostly consume in the evening; however, patient drinks leftover beers in the morning, starting as soon as she gets out of bed. Patient reports tremors and heart palpitations as soon as she wakes up each morning. [facility] used motivational interviewing strategies and patient reported that she wants to stop drinking during the day because she cannot see her granddaughter when she is intoxicated and is also concerned that it  will cause her to get fired from her job. Patient also described "being bored" as one of the primary reasons that she drinks, and stated that she only goes to work and comes home and drinks. [facility] and patient discussed potential means of behavioral activation that patient can use to replace drinking behavior. [facility] and patient also discussed stimulus control and refraining from buying enough alcohol for her to have leftover beers to drink each morning. Patient is interested in following up with [facility], and will try to schedule her next appointment with Dr. Mingo Amber on a Friday so she can see [facility] on same day.

## 2015-08-22 NOTE — Telephone Encounter (Signed)
I have completed forms and placed in to be faxed box.

## 2015-08-22 NOTE — Progress Notes (Signed)
Subjective:    Karen Casey is a 60 y.o. female who presents to [facility] today for depression and alcoholism:  1.  Depression: Started 1 month ago. 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 Evidently they did not get along well. 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 This is causing great deal depression. Denies interest in her usual activities. She is sleeping with usual. She is not cleaning her house at home. He is eating more than usual.  She has also been out of work for the past 2 weeks and is asking for FMLA paperwork to be completed.   2.  Alcoholism:  She has started drinking again.  She quantifies "drinking" as consuming alcohol in the morning. She has continued to have 1-2 beers in the evening. She completed a course of rehabilitation in 2014. She has been doing well since then. The morning drinking started about one month ago for the same time as she had a Psychologist, educational at work. She is concerned that she is going to start drinking throughout the day again and have to go to rehabilitation. She is asking for something to help with this.   ROS as above per HPI, otherwise neg.  GERD is stable. No vomiting.   The following portions of the patient's history were reviewed and updated as appropriate: allergies, current medications, past medical history, family and social history, and problem list. Patient is a nonsmoker.    PMH reviewed.  Past Medical History  Diagnosis Date  . GERD (gastroesophageal reflux disease)   . Anxiety   . Depression   . Urinary incontinence 06/09/2012  . 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    Event occurred in 2007  . Alcohol abuse   . Allergy   . Cataract 06/09/2012    Right eye and left eye  . Seizures ([place])     xanax withdrawl- December 2013   Past Surgical History  Procedure Laterality Date  . Appendectomy    . Left shoulder dislocation  Sept 2011  . Cataract extraction  06/09/2012   Left eye    Medications reviewed. Current Outpatient Prescriptions  Medication Sig Dispense Refill  . cyclobenzaprine (FLEXERIL) 10 MG tablet TAKE 1 TABLET BY MOUTH THREE TIMES DAILY AS NEEDED FOR MUSCLE SPASMS 30 tablet 0  . esomeprazole (NEXIUM) 40 MG capsule Take 1 capsule (40 mg total) by mouth daily before breakfast. 30 capsule 5  . hydrOXYzine (VISTARIL) 50 MG capsule Take 50 mg by mouth 2 (two) times daily.    Marland Kitchen ibuprofen (ADVIL,MOTRIN) 200 MG tablet Take 400 mg by mouth every 8 (eight) hours as needed for pain.     Marland Kitchen oxybutynin (DITROPAN) 5 MG tablet TAKE 1 TABLET BY MOUTH TWICE DAILY 60 tablet 0  . sucralfate (CARAFATE) 1 G tablet TAKE 1 TABLET BY MOUTH FOUR TIMES DAILY AS NEEDED 120 tablet 1   No current facility-administered medications for this visit.     Objective:   Physical Exam BP 144/81 mmHg  Pulse 79  Temp(Src) 98 F (36.7 C) (Oral)  Ht 5\' 6"  (1.676 m)  Wt 186 lb 14.4 oz (84.777 kg)  BMI 30.18 kg/m2 Gen:  Alert, cooperative patient who appears stated age in no acute distress.  Vital signs reviewed. HEENT: EOMI,  MMM Cardiac:  Regular rate and rhythm without murmur auscultated.  Good S1/S2. Pulm:  Clear lungs Psych: Tearful today. Somewhat depressed appearing.  No results found for  this or any previous visit (from the past 72 hour(s)).

## 2015-09-03 ENCOUNTER — Telehealth: Payer: Self-pay | Admitting: Family Medicine

## 2015-09-03 NOTE — Telephone Encounter (Addendum)
Need a work note to be taken out of work.  Need the note for being out last Sunday and this Sunday coming.  Please contact patient per her request.

## 2015-09-05 NOTE — Telephone Encounter (Signed)
Pt is calling back and would like to know if another doctor can write this letter for her. Please call and let her know what the outcome will be. jw

## 2015-09-05 NOTE — Telephone Encounter (Signed)
I am covering for Dr. Mingo Amber who is away from the office.  Called patient to discuss letter. She states "Dr. Mingo Amber know's what's going on but I'm sure you don't." Patient states she needs the letter by Tuesday of next week. Advised that Dr. Mingo Amber will be back on Monday and that he may be able to speak with her and help with letter. Patient was okay with this plan.  Leeanne Rio, MD

## 2015-09-08 ENCOUNTER — Emergency Department (HOSPITAL_COMMUNITY)
Admission: EM | Admit: 2015-09-08 | Discharge: 2015-09-08 | Discharge status: Against Medical Advice | Payer: BLUE CROSS/BLUE SHIELD

## 2015-09-08 ENCOUNTER — Emergency Department (HOSPITAL_COMMUNITY)
Admission: EM | Admit: 2015-09-08 | Discharge: 2015-09-08 | Disposition: A | Discharge status: Home / Self Care | Payer: BLUE CROSS/BLUE SHIELD

## 2015-09-08 ENCOUNTER — Ambulatory Visit (HOSPITAL_COMMUNITY)
Admission: EM | Admit: 2015-09-08 | Discharge: 2015-09-08 | Disposition: A | Discharge status: Home / Self Care | Payer: BLUE CROSS/BLUE SHIELD | Attending: Psychiatry | Admitting: Psychiatry

## 2015-09-08 DIAGNOSIS — Z818 Family history of other mental and behavioral disorders: Secondary | ICD-10-CM | POA: Diagnosis not present

## 2015-09-08 DIAGNOSIS — F1010 Alcohol abuse, uncomplicated: Secondary | ICD-10-CM | POA: Insufficient documentation

## 2015-09-08 DIAGNOSIS — F1310 Sedative, hypnotic or anxiolytic abuse, uncomplicated: Secondary | ICD-10-CM | POA: Diagnosis not present

## 2015-09-08 DIAGNOSIS — Z800 Family history of malignant neoplasm of digestive organs: Secondary | ICD-10-CM | POA: Diagnosis not present

## 2015-09-08 DIAGNOSIS — G4089 Other seizures: Secondary | ICD-10-CM | POA: Diagnosis not present

## 2015-09-08 DIAGNOSIS — F329 Major depressive disorder, single episode, unspecified: Secondary | ICD-10-CM | POA: Diagnosis not present

## 2015-09-08 DIAGNOSIS — F419 Anxiety disorder, unspecified: Secondary | ICD-10-CM | POA: Diagnosis not present

## 2015-09-08 DIAGNOSIS — Z8249 Family history of ischemic heart disease and other diseases of the circulatory system: Secondary | ICD-10-CM | POA: Insufficient documentation

## 2015-09-08 DIAGNOSIS — F1210 Cannabis abuse, uncomplicated: Secondary | ICD-10-CM | POA: Diagnosis present

## 2015-09-08 DIAGNOSIS — K219 Gastro-esophageal reflux disease without esophagitis: Secondary | ICD-10-CM | POA: Diagnosis not present

## 2015-09-08 NOTE — ED Notes (Signed)
Pt called back for triage, no responce

## 2015-09-08 NOTE — Telephone Encounter (Signed)
I have completed this letter.  It has been routed to the [facility]. Please print and let the patient know it is ready for her.  Thanks,  JW

## 2015-09-08 NOTE — ED Notes (Signed)
Patient called in lobby no answer 

## 2015-09-08 NOTE — ED Notes (Signed)
Registration states patient walked out of ED again.

## 2015-09-08 NOTE — ED Notes (Signed)
Pt called in lobby, no answer.

## 2015-09-08 NOTE — BH Assessment (Addendum)
Tele Assessment Note   Karen Casey is an 60 y.o.single female who came to the [facility] after first going to [facility] asking for detox from alcohol and benzodiazepines. Pt sts she is addicted to alcohol, cannabis, Xanax and other benzodiazepines and has been IP at [facility] in the past (2013 & 2014). Pt deneis SI, HI, AVH and SHI.  Pt sts she has never had suicidal thoughts or homicidal thoughts and was "too close to God" to think that way. Pt sts that since November, 2015, she has had many changes in her life that have led to a relapse and increase in her substance use that now she sts "is about to overwhelm her." Pt sts that 9 months ago her only daughter gave birth to her only grandchild, a daughter.  Pt sts that her daughter will not let her see her granddaughter and will not talk to her due to her drinking. In November, 2015, pt sts that her significant other was approved for disability income after 8 years of applying and appealing, and "once he was approved, he changed."  Pt sts he now is slighting her and acting indifferent to her where before she sts he was solely dependent on her and her sister "for everything." Pt sts that in December, 2015, her manager at work changed and she began to have problems at work. Pt sts her job has been changed and now she cannot physically do the job they have assigned her. Pt works in a Public librarian produce and frozen foods. Pt had to take a leave of absence two weeks ago from work because she "has become unstable at work after years and years of faithful employment." Pt sts she now has trouble getting to sleep and wakes up about 5 am (after drinking alcohol to get herself to sleep) wanting and drinking beer. Pt sts that between her and her significant other, they drink a case (24 beers) every day.In addition, pt sts she smokes cannabis and takes Xanax to "relax me." Pt has had panic attacks in the past but, sts that she has not had a panic attack in  months. Pt also smokes about 1/2 pack of cigarettes per day.   Pt sts she has lived for 30 years with her significant other and they have 1 daughter.  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  Pt sts she is concerned she may get fires over her "addictions." pt sts that she has a physical problem where she urinates often suddenly without warning.  Pt urinated in her clothes while waiting for her assessment and had to change into scrubs. Pt sts she has seen Dr Toy Care, psychiatrist, for years and does not have a regular therapist.  Pt sts she went to one session recently with a therapist but sts "he was dumb and knew less than I did about depression." Pt sts she is not going back. Pt sts that she overeats one day and then, sts she has no appetite the next day but, has not lost or gaines any weight. Pt was admitted to [facility] in 2013 & 2014 for detox and depression/anxiety but, sts she has not been admitted anywhere else since. Symptoms of depression include deep sadness, fatigue, excessive guilt, decreased self esteem, tearfulness & crying spells, self isolation, lack  of motivation for activities and pleasure, irritability, negative outlook, difficulty thinking & concentrating, feeling helpless and hopeless, sleep and eating disturbances. Pt deneis current symptoms of anxiety or panic attacks.   Pt was dressed in scrubs after soiling her clothes by urinating in them. Pt was liable: at first irritable and crying then, alert, cooperative and pleasant. Pt kept good eye contact, spoke in a somewhat slurred tone and at a slow pace. Pt moved in a normal manner when moving. Pt's thought process was coherent and relevant and judgement was impaired.  Pt's mood was stated to be depressed but not anxious and her blunted affect was  congruent.  Pt was oriented x 4, to person, place, time and situation.   Diagnosis: 311 Unspecified Depressive Disorder; Polysubstance Abuse by hx (Alcohol, Xanax/Benzodiazapines, Cannabis)  Past Medical History:  Past Medical History  Diagnosis Date  . GERD (gastroesophageal reflux disease)   . Anxiety   . Depression   . Urinary incontinence 06/09/2012  . 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    Event occurred in 2007  . Alcohol abuse   . Allergy   . Cataract 06/09/2012    Right eye and left eye  . Seizures ([place])     xanax withdrawl- December 2013    Past Surgical History  Procedure Laterality Date  . Appendectomy    . Left shoulder dislocation  Sept 2011  . Cataract extraction  06/09/2012    Left eye    Family History:  Family History  Problem Relation Age of Onset  . Hypertension Mother   . Hyperlipidemia Mother   . Heart disease Father   . Depression Father   . Parkinsonism Father   . Colon cancer Neg Hx   . Esophageal cancer Neg Hx   . Rectal cancer Neg Hx   . Stomach cancer Neg Hx     Social History:  reports that she has been smoking Cigarettes.  She has a 18.5 pack-year smoking history. She has never used smokeless tobacco. She reports that she drinks about 1.8 oz of alcohol per week. She reports that she uses illicit drugs (Marijuana and Other-see comments).  Additional Social History:  Alcohol / Drug Use Prescriptions: none listed on patient information History of alcohol / drug use?: Yes Longest period of sobriety (when/how long): "don't know" Substance #1 Name of Substance 1: Alcohol 1 - Age of First Use: teens 1 - Amount (size/oz): 8-10 beers 1 - Frequency: daily 1 - Duration: increased for about 3 weeks; gradually increasing since Nov 2015  1 - Last Use / Amount: "this morning" Substance #2 Name of Substance 2: Cannabis 2 - Age of First Use: teens 2 - Amount (size/oz): unknown 2 - Frequency: unknown 2 - Duration: unknown 2 - Last Use / Amount:  yesterday Substance #3 Name of Substance 3: Xanax/Benzodiazapines 3 - Age of First Use: adult 3 - Amount (size/oz): unknown 3 - Frequency: daily 3 - Duration: "I start and stop and start and stop" 3 - Last Use / Amount: today Substance #4 Name of Substance 4: Nicotine/Cigarettes 4 - Age of First Use: teens 4 - Amount (size/oz): 1/2 pack 4 - Frequency: daily 4 - Duration: 25+ years 4 - Last Use / Amount: earlier today  CIWA:   COWS:    PATIENT STRENGTHS: (choose at least two) Ability for insight Capable of independent living Communication skills Supportive family/friends  Allergies: No Known Allergies  Home Medications:  (Not in a hospital admission)  OB/GYN Status:  No LMP  recorded. Patient is postmenopausal.  General Assessment Data Location of Assessment: [facility] ([facility]In at [facility]) TTS Assessment: In system Is this a Tele or Face-to-Face Assessment?: Face-to-Face Is this an Initial Assessment or a Re-assessment for this encounter?: Initial Assessment Marital status: Long term relationship (30 years) Elwin Sleight name: na Is patient pregnant?: No Pregnancy Status: No Living Arrangements: Spouse/significant other Can pt return to current living arrangement?: Yes Admission Status: Voluntary Is patient capable of signing voluntary admission?: Yes Referral Source: Self/Family/Friend Insurance type: [place] Screening Exam ([place]) Medical Exam completed: No (refused to go to [facility] for medical clearance or sign refusal) Reason for MSE not completed: Patient Refused  Crisis Care Plan Living Arrangements: Spouse/significant other Name of Psychiatrist: Dr. Toy Care Name of Therapist: Leory Plowman Avery/Michelle Gwenlyn Saran, [place] (pt sts went for 1 visit and refuses to return)  Education Status Is patient currently in school?: No Current Grade: na Highest grade of school patient has completed: unknown Name of school: na Contact person: na  Risk to self  with the past 6 months Suicidal Ideation: No (denies) Has patient been a risk to self within the past 6 months prior to admission? : No (denies) Suicidal Intent: No (denies) Has patient had any suicidal intent within the past 6 months prior to admission? : No (denies) Is patient at risk for suicide?: No (based on denials & lack of hx) Suicidal Plan?: No (denies) Has patient had any suicidal plan within the past 6 months prior to admission? : No (denies) Access to Means: No (denies) What has been your use of drugs/alcohol within the last 12 months?: daily use Previous Attempts/Gestures: No (denies) How many times?: 0 Other Self Harm Risks: denies Triggers for Past Attempts:  (none) Intentional Self Injurious Behavior: None Family Suicide History: No Recent stressful life event(s): Conflict (Comment), Loss (Comment), Turmoil (Comment), Recent negative physical changes (Job issues; relationship issues; health problems) Persecutory voices/beliefs?: Yes Depression: Yes Depression Symptoms: Insomnia, Tearfulness, Isolating, Fatigue, Guilt, Loss of interest in usual pleasures, Feeling worthless/self pity, Feeling angry/irritable Substance abuse history and/or treatment for substance abuse?: Yes Suicide prevention information given to non-admitted patients: Not applicable  Risk to Others within the past 6 months Homicidal Ideation: No (denies) Does patient have any lifetime risk of violence toward others beyond the six months prior to admission? : No (denies) Thoughts of Harm to Others: No (denies) Current Homicidal Intent: No (deneis) Current Homicidal Plan: No (denies) Access to Homicidal Means: No (denies) Identified Victim: na History of harm to others?: No (denies) Assessment of Violence: None Noted Violent Behavior Description: na Does patient have access to weapons?: No (denies) Criminal Charges Pending?: No (denies) Does patient have a court date: No (denies) Is patient on  probation?: Yes (denies)  Psychosis Hallucinations: None noted Delusions: None noted  Mental Status Report Appearance/Hygiene: Disheveled, Poor hygiene, In scrubs, Body odor (urinated in her pants so had on scrubs from [facility] Eye Contact: Good Motor Activity: Restlessness, Freedom of movement Speech: Slurred, Slow, Logical/coherent Level of Consciousness: Restless, Crying, Irritable Mood: Depressed, Irritable Affect: Depressed, Blunted, Irritable Anxiety Level: None Thought Processes: Coherent, Relevant Judgement: Impaired Orientation: Person, Place, Time, Situation Obsessive Compulsive Thoughts/Behaviors: Unable to Assess  Cognitive Functioning Concentration: Fair Memory: Recent Intact, Remote Intact IQ: Average Insight: Fair Impulse Control: Poor Appetite: Fair Weight Loss: 0 (sts binging anf then, no appetite next day) Weight Gain: 0 Sleep: Decreased Total Hours of Sleep: 5 (sts trouble goign to sleep;wakes up at 5 am & wants  beer) Vegetative Symptoms: Not bathing, Decreased grooming, Staying in bed  ADLScreening [facility] Assessment [facility]) Patient's cognitive ability adequate to safely complete daily activities?: Yes Patient able to express need for assistance with ADLs?: Yes Independently performs ADLs?: Yes (appropriate for developmental age)  Prior Inpatient Therapy Prior Inpatient Therapy: Yes Prior Therapy Dates: 2013 & 2014 Prior Therapy Facilty/Provider(s): [facility] Reason for Treatment: Detox from Xanax and alcohol  Prior Outpatient Therapy Prior Outpatient Therapy: Yes Prior Therapy Dates: recently Prior Therapy Facilty/Provider(s): Reche Dixon, PsyD (went for 1 session) Reason for Treatment: addiction, depression Does patient have an ACCT team?: No Does patient have Intensive In-House Services?  : No Does patient have [name] services? : No Does patient have P4CC services?: No  ADL Screening (condition at time of admission) Patient's cognitive ability  adequate to safely complete daily activities?: Yes Patient able to express need for assistance with ADLs?: Yes Independently performs ADLs?: Yes (appropriate for developmental age)       Abuse/Neglect Assessment (Assessment to be complete while patient is alone) Physical Abuse: Denies Verbal Abuse: Denies Sexual Abuse: Denies Exploitation of patient/patient's resources: Denies Self-Neglect: Denies     Regulatory affairs officer (For Healthcare) Does patient have an advance directive?: No Would patient like information on creating an advanced directive?: No - patient declined information    Additional Information 1:1 In Past 12 Months?: No CIRT Risk: No Elopement Risk: Yes (left [facility] tonight to come to [facility]) Does patient have medical clearance?: No (refused)     Disposition:  Disposition Initial Assessment Completed for this Encounter: Yes Disposition of Patient: Treatment offered and refused (recommended OBS admission; pt refused) Type of treatment offered and refused: Other (Comment) (Observation unit at [facility])  Per Patriciaann Clan, PA: Pt does not meet IP criteria.  Recommend admission to OBS unit for 24 hour observation. Recommend having pt medically cleared at [facility] prior to admission. Pt refused medical clearance and admission to observation unit.  Pt left [facility] AMA.    Faylene Kurtz, MS, [facility], [place] Triage Specialist [facility] T 09/08/2015 10:03 PM

## 2015-09-08 NOTE — ED Notes (Signed)
Pt called for triage, no responce

## 2015-09-08 NOTE — Telephone Encounter (Signed)
LVM for pt to call our office. Letter is up front for pick up. Karen Casey, CMA

## 2015-09-09 ENCOUNTER — Ambulatory Visit: Payer: Self-pay | Admitting: Family Medicine

## 2015-09-10 ENCOUNTER — Telehealth: Payer: Self-pay | Admitting: Family Medicine

## 2015-09-10 NOTE — Telephone Encounter (Addendum)
Ms. Gilfillan need a note faxed to HR that she is released to return back to work.  Patient tocall back with fax number,.  Once note has been written please give to me or Kennyth Lose to fax.  Fax # [phone]  Attn: Hassan Rowan

## 2015-09-10 NOTE — Telephone Encounter (Signed)
Created note.  Please print to be faxed.  Thank you, JW

## 2015-10-13 ENCOUNTER — Other Ambulatory Visit: Payer: Self-pay | Admitting: Family Medicine

## 2015-12-22 ENCOUNTER — Encounter: Payer: Self-pay | Admitting: Family Medicine

## 2015-12-22 ENCOUNTER — Ambulatory Visit (INDEPENDENT_AMBULATORY_CARE_PROVIDER_SITE_OTHER): Payer: BLUE CROSS/BLUE SHIELD | Admitting: Family Medicine

## 2015-12-22 VITALS — BP 153/79 | HR 86 | Temp 97.9°F | Wt 195.0 lb

## 2015-12-22 DIAGNOSIS — H1010 Acute atopic conjunctivitis, unspecified eye: Secondary | ICD-10-CM | POA: Insufficient documentation

## 2015-12-22 DIAGNOSIS — M545 Low back pain: Secondary | ICD-10-CM

## 2015-12-22 DIAGNOSIS — H1013 Acute atopic conjunctivitis, bilateral: Secondary | ICD-10-CM

## 2015-12-22 DIAGNOSIS — R300 Dysuria: Secondary | ICD-10-CM | POA: Diagnosis not present

## 2015-12-22 LAB — POCT URINALYSIS DIPSTICK
BILIRUBIN UA: NEGATIVE
Blood, UA: NEGATIVE
Glucose, UA: NEGATIVE
KETONES UA: NEGATIVE
NITRITE UA: NEGATIVE
PROTEIN UA: NEGATIVE
Spec Grav, UA: 1.02
UROBILINOGEN UA: 0.2
pH, UA: 6

## 2015-12-22 LAB — POCT UA - MICROSCOPIC ONLY

## 2015-12-22 MED ORDER — CEPHALEXIN 500 MG PO CAPS: 500.0000 mg | ORAL_CAPSULE | Freq: Four times a day (QID) | ORAL | Status: DC

## 2015-12-22 MED ORDER — CYCLOBENZAPRINE HCL 10 MG PO TABS
10.0000 mg | ORAL_TABLET | Freq: Three times a day (TID) | ORAL | Status: DC | PRN
Start: 2015-12-22 — End: 2016-01-06

## 2015-12-22 MED ORDER — OLOPATADINE HCL 0.1 % OP SOLN: 1.0000 [drp] | Freq: Two times a day (BID) | OPHTHALMIC | Status: DC

## 2015-12-22 NOTE — Assessment & Plan Note (Signed)
No red flags on history or exam. Her exam is most consistent with a pulled muscle. This does not seem like flank pain secondary to a pyelonephritis.  -Discussed weight loss given her history of chronic back pain -Heat to the area as needed -Refill of Flexeril as needed for spasm.

## 2015-12-22 NOTE — Progress Notes (Signed)
Subjective: CC: dysuria, conjunctivitis  HPI: Patient is a 60 y.o. female presenting for same-day appointment with multiple complaints including right-sided lumbar pain and dysuria.  Dysuria: The patient notice dysuria on Saturday. She has stable urinary frequency.  No pain pain with wiping, no vaginal discharge, no vulvar pruritus. She denies any fevers or chills. She noted right lumbar back pain as well. The pain stays in that area without radiation. There is no lower surety weakness or numbness. No saddle paresthesias. She denies any nausea vomiting.    Itchy eyes:  The patient notices she is been feeling terrible over the last few days. She noted red itchy eyes bilaterally approximately 3 weeks. She's been taking Benadryl 25mg  daily in the morning. No rhinorrhea, sneezing, cough, nasal congestion, fevers, chills. She notes that there is no blurred vision or change in vision. No photophobia.  Social History: Current smoker  ROS: All other systems reviewed and are negative.  Past Medical History Patient Active Problem List   Diagnosis Date Noted  . Allergic conjunctivitis 12/22/2015  . Bacterial conjunctivitis 01/15/2015  . Urinary urgency 12/26/2013  . Broken teeth 12/20/2013  . Dysuria 11/28/2013  . Muscle cramping 11/28/2013  . Obesity (BMI 30-39.9) 03/29/2013  . Back pain 02/14/2013  . Cannabis dependence, unspecified 06/22/2012  . Anxiety disorder 06/12/2012  . Alcohol addiction ([place]) 05/28/2012  . Irritable bowel syndrome 05/10/2012  . Elevated blood pressure 05/10/2012  . Smoker 02/17/2012  . GERD (gastroesophageal reflux disease) 02/04/2012  . Depression 02/04/2012  . Preventative health care 02/01/2012    Medications- reviewed and updated Current Outpatient Prescriptions  Medication Sig Dispense Refill  . cephALEXin (KEFLEX) 500 MG capsule Take 1 capsule (500 mg total) by mouth 4 (four) times daily. 28 capsule 0  . cyclobenzaprine (FLEXERIL) 10 MG tablet Take  1 tablet (10 mg total) by mouth 3 (three) times daily as needed. for muscle spams 30 tablet 0  . esomeprazole (NEXIUM) 40 MG capsule Take 1 capsule (40 mg total) by mouth daily before breakfast. 30 capsule 5  . hydrOXYzine (VISTARIL) 50 MG capsule Take 50 mg by mouth 2 (two) times daily.    Marland Kitchen ibuprofen (ADVIL,MOTRIN) 200 MG tablet Take 400 mg by mouth every 8 (eight) hours as needed for pain.     . naltrexone (DEPADE) 50 MG tablet Take 1 tablet (50 mg total) by mouth daily. 30 tablet 1  . olopatadine (PATANOL) 0.1 % ophthalmic solution Place 1 drop into both eyes 2 (two) times daily. 5 mL 0  . oxybutynin (DITROPAN) 5 MG tablet TAKE 1 TABLET BY MOUTH TWICE DAILY 60 tablet 0  . sucralfate (CARAFATE) 1 g tablet TAKE 1 TABLET BY MOUTH FOUR TIMES DAILY AS NEEDED 120 tablet 2   No current facility-administered medications for this visit.    Objective: Office vital signs reviewed. BP 153/79 mmHg  Pulse 86  Temp(Src) 97.9 F (36.6 C) (Oral)  Wt 195 lb (88.451 kg)   Physical Examination:  General: Awake, alert, well- nourished, NAD ENMT:  TMs intact, normal light reflex, no erythema, no bulging. Nasal turbinates moist. MMM, Oropharynx clear without erythema or tonsillar exudate/hypertrophy Eyes: Conjunctiva injected, watery. No purulent drainage noted.  PERRL. EOMI without pain. Normal funduscopic exam. Cardio: RRR, no m/r/g noted.  Pulm: No increased WOB.  CTAB, without wheezes, rhonchi or crackles noted.  GI: soft, NT/ND,+BS x4, no hepatomegaly, no splenomegaly Back: Normal to inspection. No tenderness over the spinous processes. Tenderness over the right paraspinal muscles noted with  some spasticity. Negative straight leg raise. 5 out of 5 strength in the lower extremities bilaterally. Sensation intact grossly in the lower extremities.  Assessment/Plan: Back pain No red flags on history or exam. Her exam is most consistent with a pulled muscle. This does not seem like flank pain secondary to  a pyelonephritis.  -Discussed weight loss given her history of chronic back pain -Heat to the area as needed -Refill of Flexeril as needed for spasm.  Dysuria Given her symptoms and leukocytes noted on UA, will treat for a urinary tract infection. -Keflex 4 times a day. -Return precautions discussed.   Allergic conjunctivitis Given her symptoms appeared bilaterally at the same time, her symptoms, and her exam, this is most likely secondary to allergies. No blurred vision, photophobia, or pain with extraocular movements and turning for something worrisome like acute angle glaucoma. Normal funduscopic exam therefore less likely increased ICP - Patanol into both eyes twice daily.  -Return precautions discussed.    Orders Placed This Encounter  Procedures  . POCT urinalysis dipstick  . POCT UA - Microscopic Only    Meds ordered this encounter  Medications  . olopatadine (PATANOL) 0.1 % ophthalmic solution    Sig: Place 1 drop into both eyes 2 (two) times daily.    Dispense:  5 mL    Refill:  0  . cephALEXin (KEFLEX) 500 MG capsule    Sig: Take 1 capsule (500 mg total) by mouth 4 (four) times daily.    Dispense:  28 capsule    Refill:  0  . cyclobenzaprine (FLEXERIL) 10 MG tablet    Sig: Take 1 tablet (10 mg total) by mouth 3 (three) times daily as needed. for muscle spams    Dispense:  30 tablet    Refill:  [place] PGY-2, [place]

## 2015-12-22 NOTE — Assessment & Plan Note (Signed)
Given her symptoms appeared bilaterally at the same time, her symptoms, and her exam, this is most likely secondary to allergies. No blurred vision, photophobia, or pain with extraocular movements and turning for something worrisome like acute angle glaucoma. Normal funduscopic exam therefore less likely increased ICP - Patanol into both eyes twice daily.  -Return precautions discussed.

## 2015-12-22 NOTE — Assessment & Plan Note (Signed)
Given her symptoms and leukocytes noted on UA, will treat for a urinary tract infection. -Keflex 4 times a day. -Return precautions discussed.

## 2015-12-22 NOTE — Patient Instructions (Signed)
Start the Patanol eye drops twice daily for your eye redness/irritation. I have started you on antibiotics for a possible UTI- take Keflex four times a day for 7 days total. You lower back pain is due to a muscle spasm- use heat on the area at least 3 times a day and take Flexeril as needed.  Allergic Conjunctivitis Allergic conjunctivitis is inflammation of the clear membrane that covers the white part of your eye and the inner surface of your eyelid (conjunctiva), and it is caused by allergies. The blood vessels in the conjunctiva become inflamed, and this causes the eye to become red or pink, and it often causes itchiness in the eye. Allergic conjunctivitis cannot be spread by one person to another person (noncontagious). CAUSES This condition is caused by an allergic reaction. Common causes of an allergic reaction (allergens) include:  Dust.  Pollen.  Mold.  Animal dander or secretions. RISK FACTORS This condition is more likely to develop if you are exposed to high levels of allergens that cause the allergic reaction. This might include being outdoors when air pollen levels are high or being around animals that you are allergic to. SYMPTOMS Symptoms of this condition may include:  Eye redness.  Tearing of the eyes.  Watery eyes.  Itchy eyes.  Burning feeling in the eyes.  Clear drainage from the eyes.  Swollen eyelids. DIAGNOSIS This condition may be diagnosed by medical history and physical exam. If you have drainage from your eyes, it may be tested to rule out other causes of conjunctivitis. TREATMENT Treatment for this condition often includes medicines. These may be eye drops, ointments, or oral medicines. They may be prescription medicines or over-the-counter medicines. HOME CARE INSTRUCTIONS  Take or apply medicines only as directed by your health care provider.  Do not touch or rub your eyes.  Do not wear contact lenses until the inflammation is gone. Wear  glasses instead.  Do not wear eye makeup until the inflammation is gone.  Apply a cool, clean washcloth to your eye for 10-20 minutes, 3-4 times a day.  Try to avoid whatever allergen is causing the allergic reaction. SEEK MEDICAL CARE IF:  Your symptoms get worse.  You have pus draining from your eye.  You have new symptoms.  You have a fever.   This information is not intended to replace advice given to you by your health care provider. Make sure you discuss any questions you have with your health care provider.   Document Released: 08/21/2002 Document Revised: 06/21/2014 Document Reviewed: 03/12/2014 Elsevier Interactive Patient Education [employer].

## 2016-01-06 ENCOUNTER — Encounter: Payer: Self-pay | Admitting: Family Medicine

## 2016-01-06 ENCOUNTER — Ambulatory Visit (INDEPENDENT_AMBULATORY_CARE_PROVIDER_SITE_OTHER): Payer: BLUE CROSS/BLUE SHIELD | Admitting: Family Medicine

## 2016-01-06 VITALS — BP 132/73 | HR 99 | Temp 98.0°F | Ht 66.0 in | Wt 197.4 lb

## 2016-01-06 DIAGNOSIS — B351 Tinea unguium: Secondary | ICD-10-CM

## 2016-01-06 DIAGNOSIS — R600 Localized edema: Secondary | ICD-10-CM

## 2016-01-06 DIAGNOSIS — F1029 Alcohol dependence with unspecified alcohol-induced disorder: Secondary | ICD-10-CM

## 2016-01-06 DIAGNOSIS — B070 Plantar wart: Secondary | ICD-10-CM

## 2016-01-06 LAB — COMPREHENSIVE METABOLIC PANEL
ALBUMIN: 4 g/dL (ref 3.6–5.1)
ALK PHOS: 87 U/L (ref 33–130)
ALT: 22 U/L (ref 6–29)
AST: 22 U/L (ref 10–35)
BUN: 10 mg/dL (ref 7–25)
CALCIUM: 8.7 mg/dL (ref 8.6–10.4)
CO2: 23 mmol/L (ref 20–31)
Chloride: 104 mmol/L (ref 98–110)
Creat: 0.77 mg/dL (ref 0.50–1.05)
Glucose, Bld: 103 mg/dL — ABNORMAL HIGH (ref 65–99)
POTASSIUM: 4.1 mmol/L (ref 3.5–5.3)
Sodium: 137 mmol/L (ref 135–146)
TOTAL PROTEIN: 6.1 g/dL (ref 6.1–8.1)
Total Bilirubin: 0.5 mg/dL (ref 0.2–1.2)

## 2016-01-06 LAB — LIPID PANEL
CHOLESTEROL: 130 mg/dL (ref 125–200)
HDL: 70 mg/dL (ref >=46)
LDL Cholesterol: 34 mg/dL (ref <130)
TRIGLYCERIDES: 129 mg/dL (ref <150)
Total CHOL/HDL Ratio: 1.9 Ratio (ref <=5.0)
VLDL: 26 mg/dL (ref <30)

## 2016-01-06 LAB — TSH: TSH: 1.73 mIU/L

## 2016-01-06 LAB — CBC
HEMATOCRIT: 41.7 % (ref 35.0–45.0)
HEMOGLOBIN: 14.4 g/dL (ref 11.7–15.5)
MCH: 30 pg (ref 27.0–33.0)
MCHC: 34.5 g/dL (ref 32.0–36.0)
MCV: 86.9 fL (ref 80.0–100.0)
MPV: 9.5 fL (ref 7.5–12.5)
PLATELETS: 193 10*3/uL (ref 140–400)
RBC: 4.8 MIL/uL (ref 3.80–5.10)
RDW: 12.6 % (ref 11.0–15.0)
WBC: 6.5 10*3/uL (ref 3.8–10.8)

## 2016-01-06 MED ORDER — CYCLOBENZAPRINE HCL 10 MG PO TABS: 10.0000 mg | ORAL_TABLET | Freq: Three times a day (TID) | ORAL | 0 refills | Status: DC | PRN

## 2016-01-06 NOTE — Patient Instructions (Signed)
We're checking to see what's going on with your legs.  I think that it's from being on your feet all day.  I've referred you to a podiatrist as well.

## 2016-01-07 ENCOUNTER — Encounter: Payer: Self-pay | Admitting: Family Medicine

## 2016-01-07 ENCOUNTER — Telehealth: Payer: Self-pay | Admitting: Family Medicine

## 2016-01-07 DIAGNOSIS — R600 Localized edema: Secondary | ICD-10-CM | POA: Insufficient documentation

## 2016-01-07 DIAGNOSIS — B351 Tinea unguium: Secondary | ICD-10-CM | POA: Insufficient documentation

## 2016-01-07 NOTE — Progress Notes (Signed)
Subjective:    Karen Casey is a 60 y.o. female who presents to [facility] today for leg swelling:  1.  Leg swelling:  Present for the past week or so. She recently switched positions at work. 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 She states she has noticed that she has had worse swelling in her ankles. She is concerned because she also has had a "rash" which started around her ankles. She states when they're very swollen it is painful. She has not tried anything for relief. She has not had any palpitations, shortness breath, orthopnea. She states she is trying to lose weight but has not really had a chance to exercise.  I actually saw her at the grocery store yesterday. She showed me her ankles then and they were indeed swollen, more swollen today.  The following portions of the patient's history were reviewed and updated as appropriate: allergies, current medications, past medical history, family and social history, and problem list. Patient is a nonsmoker.    PMH reviewed.   Past Surgical History:  Procedure Laterality Date  . APPENDECTOMY    . CATARACT EXTRACTION  06/09/2012   Left eye  . left shoulder dislocation  Sept 2011    Medications reviewed. Current Outpatient Prescriptions  Medication Sig Dispense Refill  . cyclobenzaprine (FLEXERIL) 10 MG tablet Take 1 tablet (10 mg total) by mouth 3 (three) times daily as needed. for muscle spams 30 tablet 0  . esomeprazole (NEXIUM) 40 MG capsule Take 1 capsule (40 mg total) by mouth daily before breakfast. 30 capsule 5  . hydrOXYzine (VISTARIL) 50 MG capsule Take 50 mg by mouth 2 (two) times daily.    Marland Kitchen ibuprofen (ADVIL,MOTRIN) 200 MG tablet Take 400 mg by mouth every 8 (eight) hours as needed for pain.     . naltrexone (DEPADE) 50 MG tablet Take 1 tablet (50 mg total) by mouth daily. 30 tablet 1  . olopatadine (PATANOL) 0.1 % ophthalmic  solution Place 1 drop into both eyes 2 (two) times daily. 5 mL 0  . oxybutynin (DITROPAN) 5 MG tablet TAKE 1 TABLET BY MOUTH TWICE DAILY 60 tablet 0  . sucralfate (CARAFATE) 1 g tablet TAKE 1 TABLET BY MOUTH FOUR TIMES DAILY AS NEEDED 120 tablet 2   No current facility-administered medications for this visit.      Objective:   Physical Exam BP 132/73 (BP Location: Left Leg, Patient Position: Sitting, Cuff Size: Normal)   Pulse 99   Temp 98 F (36.7 C) (Oral)   Ht 5\' 6"  (1.676 m)   Wt 197 lb 6.4 oz (89.5 kg)   BMI 31.86 kg/m  Gen:  Alert, cooperative patient who appears stated age in no acute distress.  Vital signs reviewed. HEENT: EOMI,  MMM Cardiac:  Regular rate and rhythm without murmur auscultated.  Good S1/S2. Pulm:  Clear to auscultation bilaterally with good air movement.  No wheezes or rales noted.   Exts: +2 pitting edema to just below mid shins. Onychomycosis noted bilaterally Skin: Very faint hemosiderin staining starting bilateral pretibial areas mostly around ankles.

## 2016-01-07 NOTE — Assessment & Plan Note (Signed)
This is a new problem for the patient. Sounds like it is situational as she is now standing for most the day and no longer moving around. Chronic venous stasis. As per physical exam she does have the start of some slight hemosiderin changing line elevation, compression stockings. She should walk as much as possible especially outside of work, this would also benefit her from exercise and weight loss standpoint as well Checking basic labs liver, electrolytes, kidney etc. but again believe this is likely to be more gravity induced lower venous stasis.Marland Kitchen

## 2016-01-07 NOTE — Assessment & Plan Note (Signed)
She prefers referral to podiatrist for this. Also gets occasional plantar warts like to be seen for that as well.

## 2016-01-07 NOTE — Assessment & Plan Note (Signed)
She is doing much better with this.

## 2016-01-07 NOTE — Telephone Encounter (Signed)
Called and discussed lab results the patient. There does not seem to be anything systemic causing her lower extremity edema. I again recommended continued to move as much as possible, elevate her legs as needed, and use compression stockings. Patient was appreciative of call.

## 2016-01-13 ENCOUNTER — Other Ambulatory Visit: Payer: Self-pay | Admitting: Family Medicine

## 2016-01-20 ENCOUNTER — Ambulatory Visit (INDEPENDENT_AMBULATORY_CARE_PROVIDER_SITE_OTHER): Payer: BLUE CROSS/BLUE SHIELD | Admitting: Podiatry

## 2016-01-20 ENCOUNTER — Ambulatory Visit: Payer: Self-pay

## 2016-01-20 ENCOUNTER — Ambulatory Visit (INDEPENDENT_AMBULATORY_CARE_PROVIDER_SITE_OTHER): Payer: BLUE CROSS/BLUE SHIELD

## 2016-01-20 DIAGNOSIS — L603 Nail dystrophy: Secondary | ICD-10-CM

## 2016-01-20 DIAGNOSIS — M2011 Hallux valgus (acquired), right foot: Secondary | ICD-10-CM

## 2016-01-20 DIAGNOSIS — Q828 Other specified congenital malformations of skin: Secondary | ICD-10-CM | POA: Diagnosis not present

## 2016-01-20 DIAGNOSIS — M2040 Other hammer toe(s) (acquired), unspecified foot: Secondary | ICD-10-CM | POA: Diagnosis not present

## 2016-01-20 NOTE — Progress Notes (Signed)
   Subjective:    Patient ID: Karen Casey, female    DOB: 1955-07-09, 60 y.o.   MRN: [id number]  HPI: She presents today with chief complaint of a painful first metatarsophalangeal joint of the left foot. She states it is been aching for many years has developed redness and swelling and painful with shoe gear. She states this is becoming so bad that she can hardly perform her regular daily activities. She's also complaining of a painful lesion to the forefoot left states that this callus or wart which she has been told that it was in the past has been present for several years and has been removed twice. She states this seems to be coming back all the time and is painful to walk on. She is also concerned about the discoloration of her toenails.    Review of Systems  Constitutional: Positive for fatigue.  Musculoskeletal: Positive for arthralgias and gait problem.  Psychiatric/Behavioral: Positive for behavioral problems. The patient is nervous/anxious.   All other systems reviewed and are negative.      Objective:   Physical Exam: Vital signs are stable she is alert and oriented 3. Pulses are strongly palpable. Neurologic sensorium is intact. Deep tendon reflexes are intact and muscle strength is normal bilateral. Orthopedic evaluation does rates all joints distal to the ankle for range of motion without crepitus with limitation in range of motion of the first metatarsophalangeal joint of the left foot. Radiographs taken today do demonstrate an increase in the first intermetatarsal angle consistent with hallux abductovalgus deformity with an increase in hallux abductus angle joint space narrowing dorsal spurring consistent with hallux limitus. Cutaneous evaluation demonstrates supple well-hydrated cutis the exception of solitary lesion to the plantar aspect some fourth metatarsal head of the left foot. This appears to be verrucoid on initial glance however I do believe this is simply a poor  keratoma. There is some scar tissue surrounding it possibly from previous excisions. Her toenails 1 through 5 bilaterally are thick discolored dystrophic probably mycotic.      Assessment & Plan:  Assessment: Nail dystrophy bilateral. Poor keratoma plantar aspect left foot. Hallux abductovalgus deformity left foot.  Plan: We discussed etiology pathology conservative versus surgical therapies. Debrided the lesion very closely today and placed Cantharone under occlusion. We discussed the need for surgical intervention which she declined today. And I took samples of her nails and skin be sent for pathologic evaluation.

## 2016-02-03 ENCOUNTER — Other Ambulatory Visit: Payer: Self-pay | Admitting: Family Medicine

## 2016-02-12 ENCOUNTER — Telehealth: Payer: Self-pay | Admitting: *Deleted

## 2016-02-12 NOTE — Telephone Encounter (Signed)
Pt asked what to do with fungus under her toes. Left message informing pt it took 4-6 weeks for results to return and once reviewed we would call with instructions.

## 2016-04-14 ENCOUNTER — Other Ambulatory Visit: Payer: Self-pay | Admitting: Family Medicine

## 2016-04-15 ENCOUNTER — Telehealth: Payer: Self-pay | Admitting: *Deleted

## 2016-04-15 NOTE — Telephone Encounter (Signed)
Pt called for fungal culture results. Unable to leave, informing pt Dr. Milinda Pointer would review at her 04/27/2016 9:45am appt, message mailbox is full.

## 2016-04-27 ENCOUNTER — Encounter (INDEPENDENT_AMBULATORY_CARE_PROVIDER_SITE_OTHER): Payer: BLUE CROSS/BLUE SHIELD | Admitting: Podiatry

## 2016-04-27 NOTE — Progress Notes (Signed)
This encounter was created in error - please disregard.

## 2016-05-31 ENCOUNTER — Other Ambulatory Visit: Payer: Self-pay | Admitting: Family Medicine

## 2016-06-09 ENCOUNTER — Other Ambulatory Visit: Payer: Self-pay | Admitting: Family Medicine

## 2016-07-01 ENCOUNTER — Other Ambulatory Visit: Payer: Self-pay | Admitting: Family Medicine

## 2016-07-02 ENCOUNTER — Other Ambulatory Visit: Payer: Self-pay | Admitting: Family Medicine

## 2016-09-21 ENCOUNTER — Other Ambulatory Visit: Payer: Self-pay | Admitting: Family Medicine

## 2016-10-27 MED FILL — ESOMEPRAZOLE MAG DR 40 MG C: 40 | 30 days supply | Qty: 30 | Fill #0

## 2016-10-27 MED FILL — SUCRALFATE 1 GM TABLET: 1 | 30 days supply | Qty: 120 | Fill #0

## 2016-11-22 ENCOUNTER — Other Ambulatory Visit: Payer: Self-pay | Admitting: Family Medicine

## 2016-11-22 NOTE — Telephone Encounter (Signed)
Needs  Refill on nexium and sucralfate.  Is now using [facility] as her pharmacy

## 2016-11-23 MED ORDER — ESOMEPRAZOLE MAGNESIUM 40 MG PO CPDR: DELAYED_RELEASE_CAPSULE | ORAL | 1 refills | Status: DC

## 2016-11-23 MED ORDER — SUCRALFATE 1 G PO TABS: 1.0000 g | ORAL_TABLET | Freq: Four times a day (QID) | ORAL | 0 refills | Status: DC | PRN

## 2016-11-24 MED FILL — SUCRALFATE 1 GM TABLET: 1 | 30 days supply | Qty: 120 | Fill #0

## 2016-11-24 MED FILL — ESOMEPRAZOLE MAG DR 40 MG C: 40 | 90 days supply | Qty: 90 | Fill #0 | Status: TO

## 2017-02-04 ENCOUNTER — Emergency Department (HOSPITAL_COMMUNITY)
Admission: EM | Admit: 2017-02-04 | Discharge: 2017-02-04 | Disposition: A | Discharge status: Home / Self Care | Payer: Self-pay | Attending: Emergency Medicine | Admitting: Emergency Medicine

## 2017-02-04 ENCOUNTER — Emergency Department (HOSPITAL_COMMUNITY): Payer: Self-pay

## 2017-02-04 ENCOUNTER — Encounter (HOSPITAL_COMMUNITY): Payer: Self-pay | Admitting: *Deleted

## 2017-02-04 DIAGNOSIS — R079 Chest pain, unspecified: Secondary | ICD-10-CM | POA: Insufficient documentation

## 2017-02-04 DIAGNOSIS — Z5321 Procedure and treatment not carried out due to patient leaving prior to being seen by health care provider: Secondary | ICD-10-CM | POA: Insufficient documentation

## 2017-02-04 LAB — CBC
HCT: 47.6 % — ABNORMAL HIGH (ref 36.0–46.0)
Hemoglobin: 16.5 g/dL — ABNORMAL HIGH (ref 12.0–15.0)
MCH: 31.7 pg (ref 26.0–34.0)
MCHC: 34.7 g/dL (ref 30.0–36.0)
MCV: 91.5 fL (ref 78.0–100.0)
PLATELETS: 195 10*3/uL (ref 150–400)
RBC: 5.2 MIL/uL — AB (ref 3.87–5.11)
RDW: 12.9 % (ref 11.5–15.5)
WBC: 9.5 10*3/uL (ref 4.0–10.5)

## 2017-02-04 LAB — BASIC METABOLIC PANEL
Anion gap: 11 (ref 5–15)
BUN: 11 mg/dL (ref 6–20)
CALCIUM: 9.9 mg/dL (ref 8.9–10.3)
CHLORIDE: 100 mmol/L — AB (ref 101–111)
CO2: 23 mmol/L (ref 22–32)
CREATININE: 1.04 mg/dL — AB (ref 0.44–1.00)
GFR calc non Af Amer: 58 mL/min — ABNORMAL LOW (ref >60)
Glucose, Bld: 113 mg/dL — ABNORMAL HIGH (ref 65–99)
Potassium: 3.8 mmol/L (ref 3.5–5.1)
Sodium: 134 mmol/L — ABNORMAL LOW (ref 135–145)

## 2017-02-04 LAB — I-STAT TROPONIN, ED: TROPONIN I, POC: 0.02 ng/mL (ref 0.00–0.08)

## 2017-02-04 NOTE — ED Triage Notes (Signed)
Pt c/o L sided cp onset x 2 days with n/v with x 0 episodes of vomiting in the last 24 hrs, denies diarrhea, A&O x4

## 2017-02-04 NOTE — ED Notes (Signed)
Pt does not answer x 3 to be transported to room, this RN called mobile and no one answered, pt will be removed from our system

## 2017-02-04 NOTE — ED Notes (Signed)
RN called the pt's name in the waiting room x2.  No response.

## 2017-02-07 ENCOUNTER — Ambulatory Visit (INDEPENDENT_AMBULATORY_CARE_PROVIDER_SITE_OTHER): Payer: Self-pay | Admitting: Family Medicine

## 2017-02-07 ENCOUNTER — Encounter: Payer: Self-pay | Admitting: Family Medicine

## 2017-02-07 VITALS — BP 138/80 | HR 98 | Temp 98.2°F | Wt 190.0 lb

## 2017-02-07 DIAGNOSIS — K219 Gastro-esophageal reflux disease without esophagitis: Secondary | ICD-10-CM

## 2017-02-07 MED ORDER — RANITIDINE HCL 150 MG PO CAPS: 150.0000 mg | ORAL_CAPSULE | Freq: Two times a day (BID) | ORAL | 0 refills | Status: DC

## 2017-02-07 NOTE — Patient Instructions (Signed)
Try adding raniditine and eating more bland foods. Do not skip meals because it sounds like your stomach gets upset from this lately.    Food Choices for Gastroesophageal Reflux Disease, Adult When you have gastroesophageal reflux disease (GERD), the foods you eat and your eating habits are very important. Choosing the right foods can help ease your discomfort. What guidelines do I need to follow?  Choose fruits, vegetables, whole grains, and low-fat dairy products.  Choose low-fat meat, fish, and poultry.  Limit fats such as oils, salad dressings, butter, nuts, and avocado.  Keep a food diary. This helps you identify foods that cause symptoms.  Avoid foods that cause symptoms. These may be different for everyone.  Eat small meals often instead of 3 large meals a day.  Eat your meals slowly, in a place where you are relaxed.  Limit fried foods.  Cook foods using methods other than frying.  Avoid drinking alcohol.  Avoid drinking large amounts of liquids with your meals.  Avoid bending over or lying down until 2-3 hours after eating. What foods are not recommended? These are some foods and drinks that may make your symptoms worse: Vegetables Tomatoes. Tomato juice. Tomato and spaghetti sauce. Chili peppers. Onion and garlic. Horseradish. Fruits Oranges, grapefruit, and lemon (fruit and juice). Meats High-fat meats, fish, and poultry. This includes hot dogs, ribs, ham, sausage, salami, and bacon. Dairy Whole milk and chocolate milk. Sour cream. Cream. Butter. Ice cream. Cream cheese. Drinks Coffee and tea. Bubbly (carbonated) drinks or energy drinks. Condiments Hot sauce. Barbecue sauce. Sweets/Desserts Chocolate and cocoa. Donuts. Peppermint and spearmint. Fats and Oils High-fat foods. This includes [place] fries and potato chips. Other Vinegar. Strong spices. This includes black pepper, white pepper, red pepper, cayenne, curry powder, cloves, ginger, and chili  powder. The items listed above may not be a complete list of foods and drinks to avoid. Contact your dietitian for more information. This information is not intended to replace advice given to you by your health care provider. Make sure you discuss any questions you have with your health care provider. Document Released: 11/30/2011 Document Revised: 11/06/2015 Document Reviewed: 04/04/2013 Elsevier Interactive Patient Education  2017 [employer].

## 2017-02-07 NOTE — Progress Notes (Signed)
Subjective:  Karen Casey is a 61 y.o. female who presents to the [facility] today with a chief complaint of acid reflux  HPI:  Acid reflux - has had long standing nausea for most of her life but acutely worse over the last 3 weeks, went to ED for this a few days ago but left before being seen. No nausea currently. - States similar to how her acid reflux has presented in the past. - Has been on sucralfate and nexium for 4 years which was working well before, states has been compliant on this medication - Thinks is related to her acid reflux because she feels this aching mild abdominal pain. Is most worried about an ulcer - Has not been eating much and frequently goes throughout her day with an empty stomach because she has been trying to lose weight and taking tylenol without food. This started about 3 weeks ago as well - Also endorses increased stress in her life  - No fever/chills. Did have one episode of NBNB vomiting that was brought on by nausea and gagging after trying to burger and fries. No blood in stool. No urinary symptoms.   ROS: Per HPI  Objective:  Physical Exam: BP 138/80   Pulse 98   Temp 98.2 F (36.8 C) (Oral)   Wt 190 lb (86.2 kg)   SpO2 97%   BMI 31.62 kg/m   Gen: NAD, resting comfortably CV: RRR with no murmurs appreciated Pulm: NWOB, CTAB with no crackles, wheezes, or rhonchi GI: Normal bowel sounds present. Soft, Nontender, Nondistended. No rebound or guarding. MSK: no edema, cyanosis, or clubbing noted Skin: warm, dry Neuro: grossly normal, moves all extremities Psych: Normal affect and thought content  Results for orders placed or performed during the hospital encounter of 02/04/17 (from the past 72 hour(s))  Basic metabolic panel     Status: Abnormal   Collection Time: 02/04/17 12:57 PM  Result Value Ref Range   Sodium 134 (L) 135 - 145 mmol/L   Potassium 3.8 3.5 - 5.1 mmol/L   Chloride 100 (L) 101 - 111 mmol/L   CO2 23 22 - 32 mmol/L   Glucose, Bld 113 (H) 65 - 99 mg/dL   BUN 11 6 - 20 mg/dL   Creatinine, Ser 1.04 (H) 0.44 - 1.00 mg/dL   Calcium 9.9 8.9 - 10.3 mg/dL   GFR calc non Af Amer 58 (L) >60 mL/min   GFR calc Af Amer >60 >60 mL/min    Comment: (NOTE) The eGFR has been calculated using the CKD EPI equation. This calculation has not been validated in all clinical situations. eGFR's persistently <60 mL/min signify possible Chronic Kidney Disease.    Anion gap 11 5 - 15  CBC     Status: Abnormal   Collection Time: 02/04/17 12:57 PM  Result Value Ref Range   WBC 9.5 4.0 - 10.5 K/uL   RBC 5.20 (H) 3.87 - 5.11 MIL/uL   Hemoglobin 16.5 (H) 12.0 - 15.0 g/dL   HCT 47.6 (H) 36.0 - 46.0 %   MCV 91.5 78.0 - 100.0 fL   MCH 31.7 26.0 - 34.0 pg   MCHC 34.7 30.0 - 36.0 g/dL   RDW 12.9 11.5 - 15.5 %   Platelets 195 150 - 400 K/uL  I-stat troponin, ED     Status: None   Collection Time: 02/04/17  1:13 PM  Result Value Ref Range   Troponin i, poc 0.02 0.00 - 0.08 ng/mL   Comment 3  Comment: Due to the release kinetics of cTnI, a negative result within the first hours of the onset of symptoms does not rule out myocardial infarction with certainty. If myocardial infarction is still suspected, repeat the test at appropriate intervals.      Assessment/Plan:  GERD (gastroesophageal reflux disease) Ddx for nausea is broad. Patient has stable vitals and benign abdominal exam today and is not currently nauseous. Given patient's impression that related to her acid reflux, will treat with H2 blocker in addition to her PPI and sucralfate. Suspect likely due to skipping meals that started around the same time. I advised that the patient eat regular small meals and to start with bland foods since greasy foods seem to be a trigger. Discussed return precautions and advise she follow up with her PCP.   Bufford Lope, DO PGY-2, [facility] 02/07/2017 9:40 AM

## 2017-02-08 NOTE — Assessment & Plan Note (Addendum)
Ddx for nausea is broad. Patient has stable vitals and benign abdominal exam today and is not currently nauseous. Given patient's impression that related to her acid reflux, will treat with H2 blocker in addition to her PPI and sucralfate. Suspect likely due to skipping meals that started around the same time. I advised that the patient eat regular small meals and to start with bland foods since greasy foods seem to be a trigger. Discussed return precautions and advise she follow up with her PCP.

## 2017-02-17 ENCOUNTER — Other Ambulatory Visit: Payer: Self-pay | Admitting: Family Medicine

## 2017-02-17 MED FILL — raNITIdine HCL 150 MG TABS: 150 | 30 days supply | Qty: 60 | Fill #0

## 2017-02-17 MED FILL — ESOMEPRAZOLE MAG DR 40 MG C: 40 | 30 days supply | Qty: 30 | Fill #1

## 2017-02-18 MED FILL — SUCRALFATE 1 GM TABLET: 1 | 30 days supply | Qty: 120 | Fill #0 | Status: TO

## 2017-02-28 ENCOUNTER — Ambulatory Visit: Payer: Self-pay | Admitting: Family Medicine

## 2017-03-01 ENCOUNTER — Other Ambulatory Visit: Payer: Self-pay | Admitting: *Deleted

## 2017-03-01 MED ORDER — ESOMEPRAZOLE MAGNESIUM 40 MG PO CPDR: DELAYED_RELEASE_CAPSULE | ORAL | 1 refills | Status: DC

## 2017-03-21 ENCOUNTER — Other Ambulatory Visit: Payer: Self-pay | Admitting: Family Medicine

## 2017-03-21 MED FILL — raNITIdine HCL 150 MG TABS: 150 | 30 days supply | Qty: 60 | Fill #0

## 2017-03-21 MED FILL — ESOMEPRAZOLE MAG DR 40 MG C: 40 | 30 days supply | Qty: 30 | Fill #0 | Status: TO

## 2017-04-20 MED FILL — SUCRALFATE 1 GM TABLET: 1 | 30 days supply | Qty: 120 | Fill #1 | Status: TO

## 2017-04-20 MED FILL — raNITIdine HCL 150 MG TABS: 150 | 30 days supply | Qty: 60 | Fill #1

## 2017-05-19 MED FILL — raNITIdine HCL 150 MG TABS: 150 | 30 days supply | Qty: 60 | Fill #2 | Status: TO

## 2017-06-15 ENCOUNTER — Other Ambulatory Visit: Payer: Self-pay | Admitting: *Deleted

## 2017-06-15 NOTE — Telephone Encounter (Signed)
Patient left message on nurse line requesting refill but did not leave name of med or pharmacy. Called patient, no answer. Left message on VM requesting return call with exact meds and pharmacy name with location. Hubbard Hartshorn, RN, BSN

## 2017-07-11 ENCOUNTER — Other Ambulatory Visit: Payer: Self-pay | Admitting: Family Medicine

## 2017-07-11 DIAGNOSIS — K219 Gastro-esophageal reflux disease without esophagitis: Secondary | ICD-10-CM

## 2017-07-11 NOTE — Telephone Encounter (Signed)
Pt has been taking 4 zantac a day because of her stomach issues.  As a result, she is out of her medication. She would to get a refill on the medicine early.  She sees her GI dr 07-18-17.  Please advise

## 2017-07-12 ENCOUNTER — Encounter: Payer: Self-pay | Admitting: Family Medicine

## 2017-07-12 MED ORDER — RANITIDINE HCL 150 MG PO CAPS: 150.0000 mg | ORAL_CAPSULE | Freq: Two times a day (BID) | ORAL | 3 refills | Status: DC

## 2017-07-12 NOTE — Telephone Encounter (Signed)
Pt has appointment scheduled for 07/15/17. Katharina Caper, Misheel Gowans D, [place]

## 2017-07-12 NOTE — Telephone Encounter (Signed)
PCLM for pt to call the office. If pt calls, please give her the info below and help her schedule and appointment if needed. Ottis Stain, CMA

## 2017-07-12 NOTE — Telephone Encounter (Signed)
LM to call office back to be sure she knows her medicine was sent to pharmacy. Katharina Caper, Caden Fukushima D, [place]

## 2017-07-12 NOTE — Telephone Encounter (Signed)
I have refilled her medicines.  If she's having that many issues, she should come back in to see me.  Thanks, JW

## 2017-07-15 ENCOUNTER — Encounter: Payer: Self-pay | Admitting: Family Medicine

## 2017-07-18 ENCOUNTER — Telehealth: Payer: Self-pay | Admitting: Gastroenterology

## 2017-07-18 ENCOUNTER — Ambulatory Visit: Payer: Self-pay | Admitting: Gastroenterology

## 2017-08-04 ENCOUNTER — Encounter: Payer: Medicaid Other | Admitting: Family Medicine

## 2017-08-12 ENCOUNTER — Ambulatory Visit: Payer: Self-pay | Admitting: Gastroenterology

## 2017-08-17 ENCOUNTER — Other Ambulatory Visit: Payer: Self-pay | Admitting: Family Medicine

## 2017-08-23 ENCOUNTER — Other Ambulatory Visit: Payer: Self-pay

## 2017-08-23 ENCOUNTER — Other Ambulatory Visit (HOSPITAL_COMMUNITY)
Admission: RE | Admit: 2017-08-23 | Discharge: 2017-08-23 | Disposition: A | Discharge status: Home / Self Care | Payer: BLUE CROSS/BLUE SHIELD | Source: Ambulatory Visit | Attending: Family Medicine | Admitting: Family Medicine

## 2017-08-23 ENCOUNTER — Ambulatory Visit (INDEPENDENT_AMBULATORY_CARE_PROVIDER_SITE_OTHER): Payer: BLUE CROSS/BLUE SHIELD | Admitting: Family Medicine

## 2017-08-23 ENCOUNTER — Encounter: Payer: Self-pay | Admitting: Family Medicine

## 2017-08-23 VITALS — BP 162/94 | HR 85 | Temp 97.6°F | Ht 66.0 in | Wt 182.4 lb

## 2017-08-23 DIAGNOSIS — Z124 Encounter for screening for malignant neoplasm of cervix: Secondary | ICD-10-CM | POA: Insufficient documentation

## 2017-08-23 DIAGNOSIS — K219 Gastro-esophageal reflux disease without esophagitis: Secondary | ICD-10-CM | POA: Diagnosis not present

## 2017-08-23 DIAGNOSIS — R350 Frequency of micturition: Secondary | ICD-10-CM

## 2017-08-23 DIAGNOSIS — Z23 Encounter for immunization: Secondary | ICD-10-CM

## 2017-08-23 DIAGNOSIS — R030 Elevated blood-pressure reading, without diagnosis of hypertension: Secondary | ICD-10-CM

## 2017-08-23 DIAGNOSIS — Z13220 Encounter for screening for lipoid disorders: Secondary | ICD-10-CM | POA: Diagnosis not present

## 2017-08-23 DIAGNOSIS — M79671 Pain in right foot: Secondary | ICD-10-CM | POA: Diagnosis not present

## 2017-08-23 DIAGNOSIS — M79672 Pain in left foot: Secondary | ICD-10-CM | POA: Diagnosis not present

## 2017-08-23 DIAGNOSIS — F1029 Alcohol dependence with unspecified alcohol-induced disorder: Secondary | ICD-10-CM | POA: Diagnosis not present

## 2017-08-23 DIAGNOSIS — R3915 Urgency of urination: Secondary | ICD-10-CM | POA: Diagnosis not present

## 2017-08-23 DIAGNOSIS — Z0000 Encounter for general adult medical examination without abnormal findings: Secondary | ICD-10-CM | POA: Insufficient documentation

## 2017-08-23 DIAGNOSIS — K580 Irritable bowel syndrome with diarrhea: Secondary | ICD-10-CM | POA: Diagnosis not present

## 2017-08-23 LAB — POCT URINALYSIS DIP (MANUAL ENTRY)
Bilirubin, UA: NEGATIVE
Glucose, UA: NEGATIVE mg/dL
Ketones, POC UA: NEGATIVE mg/dL
LEUKOCYTES UA: NEGATIVE
Nitrite, UA: NEGATIVE
Protein Ur, POC: NEGATIVE mg/dL
Spec Grav, UA: 1.02 (ref 1.010–1.025)
UROBILINOGEN UA: 0.2 U/dL
pH, UA: 6.5 (ref 5.0–8.0)

## 2017-08-23 LAB — POCT UA - MICROSCOPIC ONLY

## 2017-08-23 MED ORDER — RANITIDINE HCL 150 MG PO CAPS: 150.0000 mg | ORAL_CAPSULE | Freq: Two times a day (BID) | ORAL | 3 refills | Status: DC

## 2017-08-23 MED ORDER — ESOMEPRAZOLE MAGNESIUM 40 MG PO CPDR: DELAYED_RELEASE_CAPSULE | ORAL | 1 refills | Status: DC

## 2017-08-23 MED ORDER — FLUTICASONE PROPIONATE 50 MCG/ACT NA SUSP: 2.0000 | Freq: Every day | NASAL | 6 refills | Status: DC

## 2017-08-23 MED ORDER — SUCRALFATE 1 G PO TABS: 1.0000 g | ORAL_TABLET | Freq: Four times a day (QID) | ORAL | 2 refills | Status: DC | PRN

## 2017-08-23 NOTE — Assessment & Plan Note (Signed)
Persists.  Checking UA today.

## 2017-08-23 NOTE — Assessment & Plan Note (Signed)
Ongoing.  Declined any further treatment today.

## 2017-08-23 NOTE — Assessment & Plan Note (Signed)
Flu shot and mammogram ordered today.  Hep C and HIV testing today.  She did have a Pap smear done today.

## 2017-08-23 NOTE — Assessment & Plan Note (Signed)
She is doing a little bit of this.  This is likely contributing to her GERD.

## 2017-08-23 NOTE — Assessment & Plan Note (Signed)
With bilateral foot pain - secondary to bunion and plantar wart.  Desires referral to podiatry.  Will place today.

## 2017-08-23 NOTE — Assessment & Plan Note (Signed)
No history of hypertension.  Recheck next visit.

## 2017-08-23 NOTE — Patient Instructions (Addendum)
It was good to see you as always!  Try the flonase to help with the burning on the roof of your mouth.  This will also help with the runny eyes.  You will need to use this daily as a preventative and treatment medicine.    I will refer you to a podiatrist for your bunion and wart.   I have put in for a mammogram for you as well.   I have refilled your medicines.    Come back and see me in about 3 - 6 months.

## 2017-08-23 NOTE — Progress Notes (Signed)
Subjective:    Karen Casey is a 62 y.o. female who presents to [facility] today for FU for GERD and URI sx's:  1.  URI sx's: Patient states she has had more frequent URI's this year than usual.  Also with runny nose and eyes even when she is not feeling poorly.  She has a burning sensation on the roof of her mouth and itchy nose.  Also sometimes itchy ears.  She does report having poor dental hygiene and is trying to work with her insurance to see a dentist.  No fevers or chills.  No cough.  2.  Foot issues: She has a bunion on her right first MTP joint and left-sided plantar wart.  These have both been there for several months.  She is now walking with a limp because of this.  She would like to see a podiatrist and has recommendation to see Dr. Orville Govern with [facility].  She has not tried anything for relief here.  She does have some pain due to both the bunion and plantar wart.  3.  GERD: Ongoing issues.  She has had some improvement with Zantac.  She continues to soak her feet.  She has had no weight loss.  No abdominal masses.  She has vomiting of bile when she becomes nervous.  This can happen once or twice a week or shingles or weeks without any.  She also has diarrhea whenever she feels anxious.  Otherwise eating and drinking well.  Remaining hydrated.   ROS as above per HPI.  NO CP or DOE   The following portions of the patient's history were reviewed and updated as appropriate: allergies, current medications, past medical history, family and social history, and problem list. Patient is a nonsmoker.    PMH reviewed.  Past Medical History:  Diagnosis Date  . Alcohol abuse   . Allergy   . Anxiety   . Cataract 06/09/2012   Right eye and left eye  . Depression   . GERD (gastroesophageal reflux disease)   . 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 Seizures ([place])    xanax withdrawl- December 2013  . Urinary incontinence 06/09/2012   Past Surgical History:   Procedure Laterality Date  . APPENDECTOMY    . CATARACT EXTRACTION  06/09/2012   Left eye  . left shoulder dislocation  Sept 2011    Medications reviewed. Current Outpatient Medications  Medication Sig Dispense Refill  . ALPRAZolam (XANAX) 1 MG tablet   0  . citalopram (CELEXA) 40 MG tablet   11  . esomeprazole (NEXIUM) 40 MG capsule TAKE 1 CAPSULE BY MOUTH EVERY MORNING WITH BREAKFAST 90 capsule 1  . ranitidine (ZANTAC) 150 MG capsule Take 1 capsule (150 mg total) by mouth 2 (two) times daily. 60 capsule 3  . sucralfate (CARAFATE) 1 g tablet TAKE 1 TABLET BY MOUTH FOUR TIMES DAILY AS NEEDED 120 tablet 2   No current facility-administered medications for this visit.      Objective:   Physical Exam BP (!) 162/94   Pulse 85   Temp 97.6 F (36.4 C) (Oral)   Ht 5\' 6"  (1.676 m)   Wt 182 lb 6.4 oz (82.7 kg)   SpO2 96%   BMI 29.44 kg/m  Gen:  Alert, cooperative patient who appears stated age in no acute distress.  Vital signs reviewed. HEENT: EOMI. somewhat swollen and boggy nasal turbinates bilaterally.  Poor dental hygiene.  MMM Cardiac:  Regular rate  and rhythm without murmur auscultated.  Good S1/S2. Pulm:  Clear to auscultation bilaterally with good air movement.  No wheezes or rales noted.   Abd:  Soft/nondistended/minimally tender in left upper quadrant and epigastrium.  No masses noted.  No organomegaly.  Good bowel sounds throughout. Exts: Non edematous BL  LE, warm and well perfused. Feet:  Right foot with inflamed bunion 1st MTP joint.  TTP here.  Left foot with plantar wart noted ball of foot.  Also mildly tender.  Psych:  Not depressed or anxious appearing.  Linear and coherent thought process as evidenced by speech pattern. Smiles spontaneously.    No results found for this or any previous visit (from the past 72 hour(s)).

## 2017-08-23 NOTE — Assessment & Plan Note (Signed)
No red flags.  She is done much better with Zantac.  Continue with smaller meals.  Refilled her sucralfate.  Follow-up in 3 months.

## 2017-08-24 ENCOUNTER — Encounter: Payer: Self-pay | Admitting: Family Medicine

## 2017-08-24 LAB — CBC WITH DIFFERENTIAL/PLATELET
BASOS ABS: 0 10*3/uL (ref 0.0–0.2)
Basos: 0 %
EOS (ABSOLUTE): 0.1 10*3/uL (ref 0.0–0.4)
EOS: 1 %
HEMATOCRIT: 45.6 % (ref 34.0–46.6)
HEMOGLOBIN: 15.8 g/dL (ref 11.1–15.9)
Immature Grans (Abs): 0 10*3/uL (ref 0.0–0.1)
Immature Granulocytes: 0 %
LYMPHS ABS: 1.3 10*3/uL (ref 0.7–3.1)
Lymphs: 20 %
MCH: 32.2 pg (ref 26.6–33.0)
MCHC: 34.6 g/dL (ref 31.5–35.7)
MCV: 93 fL (ref 79–97)
MONOCYTES: 8 %
MONOS ABS: 0.5 10*3/uL (ref 0.1–0.9)
NEUTROS ABS: 4.6 10*3/uL (ref 1.4–7.0)
NEUTROS PCT: 71 %
Platelets: 214 10*3/uL (ref 150–379)
RBC: 4.9 x10E6/uL (ref 3.77–5.28)
RDW: 13 % (ref 12.3–15.4)
WBC: 6.4 10*3/uL (ref 3.4–10.8)

## 2017-08-24 LAB — COMPREHENSIVE METABOLIC PANEL
ALBUMIN: 4.3 g/dL (ref 3.5–5.5)
ALT: 33 IU/L — ABNORMAL HIGH (ref 0–32)
AST: 29 IU/L (ref 0–40)
Albumin/Globulin Ratio: 1.7 (ref 1.2–2.2)
Alkaline Phosphatase: 90 IU/L (ref 39–117)
BILIRUBIN TOTAL: 0.4 mg/dL (ref 0.0–1.2)
BUN / CREAT RATIO: 16 (ref 9–23)
BUN: 12 mg/dL (ref 6–24)
CHLORIDE: 102 mmol/L (ref 96–106)
CO2: 24 mmol/L (ref 20–29)
Calcium: 9.6 mg/dL (ref 8.7–10.2)
Creatinine, Ser: 0.73 mg/dL (ref 0.57–1.00)
GFR calc Af Amer: 104 mL/min/{1.73_m2} (ref >59)
GFR calc non Af Amer: 90 mL/min/{1.73_m2} (ref >59)
GLOBULIN, TOTAL: 2.6 g/dL (ref 1.5–4.5)
Glucose: 100 mg/dL — ABNORMAL HIGH (ref 65–99)
POTASSIUM: 4.4 mmol/L (ref 3.5–5.2)
SODIUM: 141 mmol/L (ref 134–144)
Total Protein: 6.9 g/dL (ref 6.0–8.5)

## 2017-08-24 LAB — LIPID PANEL
CHOLESTEROL TOTAL: 154 mg/dL (ref 100–199)
Chol/HDL Ratio: 2 ratio (ref 0.0–4.4)
HDL: 78 mg/dL (ref >39)
LDL Calculated: 61 mg/dL (ref 0–99)
Triglycerides: 76 mg/dL (ref 0–149)
VLDL Cholesterol Cal: 15 mg/dL (ref 5–40)

## 2017-08-24 LAB — TSH: TSH: 1.14 u[IU]/mL (ref 0.450–4.500)

## 2017-08-24 LAB — HEPATITIS C ANTIBODY: Hep C Virus Ab: <0.1 s/co ratio (ref 0.0–0.9)

## 2017-08-24 LAB — HIV ANTIBODY (ROUTINE TESTING W REFLEX): HIV SCREEN 4TH GENERATION: NONREACTIVE

## 2017-08-26 ENCOUNTER — Encounter: Payer: Self-pay | Admitting: Family Medicine

## 2017-08-26 LAB — CYTOLOGY - PAP
Diagnosis: NEGATIVE
HPV: NOT DETECTED

## 2017-09-07 ENCOUNTER — Telehealth: Payer: Self-pay | Admitting: Family Medicine

## 2017-09-07 NOTE — Telephone Encounter (Signed)
Pt is calling and would like to have the results of all her labs. Please call and let her know. jw

## 2017-09-08 NOTE — Telephone Encounter (Signed)
lmovm for pt to return call.   We mailed her a letter with the results last week. Fleeger, Salome Spotted, CMA

## 2017-09-13 ENCOUNTER — Other Ambulatory Visit: Payer: Self-pay | Admitting: Family Medicine

## 2017-09-13 DIAGNOSIS — Z1231 Encounter for screening mammogram for malignant neoplasm of breast: Secondary | ICD-10-CM

## 2017-09-26 ENCOUNTER — Emergency Department (HOSPITAL_COMMUNITY): Payer: BLUE CROSS/BLUE SHIELD

## 2017-09-26 ENCOUNTER — Other Ambulatory Visit: Payer: Self-pay

## 2017-09-26 ENCOUNTER — Inpatient Hospital Stay (HOSPITAL_COMMUNITY)
Admission: EM | Admit: 2017-09-26 | Discharge: 2017-09-29 | DRG: 872 | Disposition: A | Discharge status: Home / Self Care | Payer: BLUE CROSS/BLUE SHIELD | Attending: Family Medicine | Admitting: Family Medicine

## 2017-09-26 ENCOUNTER — Ambulatory Visit: Payer: Self-pay | Admitting: Gastroenterology

## 2017-09-26 ENCOUNTER — Telehealth: Payer: Self-pay | Admitting: Gastroenterology

## 2017-09-26 ENCOUNTER — Ambulatory Visit: Payer: Self-pay | Admitting: Podiatry

## 2017-09-26 ENCOUNTER — Encounter (HOSPITAL_COMMUNITY): Payer: Self-pay

## 2017-09-26 DIAGNOSIS — Z8249 Family history of ischemic heart disease and other diseases of the circulatory system: Secondary | ICD-10-CM

## 2017-09-26 DIAGNOSIS — K5792 Diverticulitis of intestine, part unspecified, without perforation or abscess without bleeding: Secondary | ICD-10-CM

## 2017-09-26 DIAGNOSIS — Z79899 Other long term (current) drug therapy: Secondary | ICD-10-CM

## 2017-09-26 DIAGNOSIS — F32A Depression, unspecified: Secondary | ICD-10-CM | POA: Diagnosis present

## 2017-09-26 DIAGNOSIS — F17210 Nicotine dependence, cigarettes, uncomplicated: Secondary | ICD-10-CM | POA: Diagnosis present

## 2017-09-26 DIAGNOSIS — F418 Other specified anxiety disorders: Secondary | ICD-10-CM | POA: Diagnosis present

## 2017-09-26 DIAGNOSIS — Z9842 Cataract extraction status, left eye: Secondary | ICD-10-CM

## 2017-09-26 DIAGNOSIS — Z7951 Long term (current) use of inhaled steroids: Secondary | ICD-10-CM

## 2017-09-26 DIAGNOSIS — Z818 Family history of other mental and behavioral disorders: Secondary | ICD-10-CM

## 2017-09-26 DIAGNOSIS — F1010 Alcohol abuse, uncomplicated: Secondary | ICD-10-CM | POA: Diagnosis present

## 2017-09-26 DIAGNOSIS — F17200 Nicotine dependence, unspecified, uncomplicated: Secondary | ICD-10-CM | POA: Diagnosis present

## 2017-09-26 DIAGNOSIS — Z7289 Other problems related to lifestyle: Secondary | ICD-10-CM | POA: Diagnosis present

## 2017-09-26 DIAGNOSIS — F419 Anxiety disorder, unspecified: Secondary | ICD-10-CM | POA: Diagnosis present

## 2017-09-26 DIAGNOSIS — F329 Major depressive disorder, single episode, unspecified: Secondary | ICD-10-CM | POA: Diagnosis present

## 2017-09-26 DIAGNOSIS — A419 Sepsis, unspecified organism: Secondary | ICD-10-CM | POA: Diagnosis not present

## 2017-09-26 DIAGNOSIS — K219 Gastro-esophageal reflux disease without esophagitis: Secondary | ICD-10-CM | POA: Diagnosis present

## 2017-09-26 DIAGNOSIS — K5780 Diverticulitis of intestine, part unspecified, with perforation and abscess without bleeding: Secondary | ICD-10-CM

## 2017-09-26 DIAGNOSIS — E876 Hypokalemia: Secondary | ICD-10-CM | POA: Diagnosis present

## 2017-09-26 DIAGNOSIS — K5720 Diverticulitis of large intestine with perforation and abscess without bleeding: Secondary | ICD-10-CM | POA: Diagnosis present

## 2017-09-26 DIAGNOSIS — Z720 Tobacco use: Secondary | ICD-10-CM | POA: Diagnosis present

## 2017-09-26 DIAGNOSIS — F1020 Alcohol dependence, uncomplicated: Secondary | ICD-10-CM | POA: Diagnosis present

## 2017-09-26 DIAGNOSIS — Z789 Other specified health status: Secondary | ICD-10-CM | POA: Diagnosis present

## 2017-09-26 LAB — COMPREHENSIVE METABOLIC PANEL
ALBUMIN: 3.8 g/dL (ref 3.5–5.0)
ALT: 19 U/L (ref 14–54)
ANION GAP: 14 (ref 5–15)
AST: 15 U/L (ref 15–41)
Alkaline Phosphatase: 73 U/L (ref 38–126)
BILIRUBIN TOTAL: 1 mg/dL (ref 0.3–1.2)
BUN: 10 mg/dL (ref 6–20)
CHLORIDE: 101 mmol/L (ref 101–111)
CO2: 21 mmol/L — ABNORMAL LOW (ref 22–32)
Calcium: 9.2 mg/dL (ref 8.9–10.3)
Creatinine, Ser: 0.67 mg/dL (ref 0.44–1.00)
GFR calc Af Amer: >60 mL/min (ref >60)
Glucose, Bld: 117 mg/dL — ABNORMAL HIGH (ref 65–99)
POTASSIUM: 3.5 mmol/L (ref 3.5–5.1)
Sodium: 136 mmol/L (ref 135–145)
TOTAL PROTEIN: 7.8 g/dL (ref 6.5–8.1)

## 2017-09-26 LAB — URINALYSIS, ROUTINE W REFLEX MICROSCOPIC
BACTERIA UA: NONE SEEN
BILIRUBIN URINE: NEGATIVE
GLUCOSE, UA: NEGATIVE mg/dL
KETONES UR: 20 mg/dL — AB
LEUKOCYTES UA: NEGATIVE
NITRITE: NEGATIVE
PROTEIN: 100 mg/dL — AB
Specific Gravity, Urine: 1.024 (ref 1.005–1.030)
pH: 5 (ref 5.0–8.0)

## 2017-09-26 LAB — CBC
HEMATOCRIT: 45.6 % (ref 36.0–46.0)
HEMOGLOBIN: 15.4 g/dL — AB (ref 12.0–15.0)
MCH: 32.2 pg (ref 26.0–34.0)
MCHC: 33.8 g/dL (ref 30.0–36.0)
MCV: 95.4 fL (ref 78.0–100.0)
Platelets: 206 10*3/uL (ref 150–400)
RBC: 4.78 MIL/uL (ref 3.87–5.11)
RDW: 12 % (ref 11.5–15.5)
WBC: 16.5 10*3/uL — ABNORMAL HIGH (ref 4.0–10.5)

## 2017-09-26 LAB — LIPASE, BLOOD: LIPASE: 28 U/L (ref 11–51)

## 2017-09-26 MED ORDER — IOPAMIDOL (ISOVUE-300) INJECTION 61%
INTRAVENOUS | Status: AC
  Filled 2017-09-26: qty 30

## 2017-09-26 MED ORDER — ONDANSETRON HCL 4 MG/2ML IJ SOLN
4.0000 mg | Freq: Once | INTRAMUSCULAR | Status: AC
  Administered 2017-09-26: 4 mg via INTRAVENOUS
  Filled 2017-09-26: qty 2

## 2017-09-26 MED ORDER — SODIUM CHLORIDE 0.9 % IV BOLUS
1000.0000 mL | Freq: Once | INTRAVENOUS | Status: AC
  Administered 2017-09-26: 1000 mL via INTRAVENOUS

## 2017-09-26 MED ORDER — PIPERACILLIN-TAZOBACTAM 3.375 G IVPB 30 MIN
3.3750 g | Freq: Once | INTRAVENOUS | Status: AC
  Administered 2017-09-26: 3.375 g via INTRAVENOUS
  Filled 2017-09-26: qty 50

## 2017-09-26 MED ORDER — ONDANSETRON HCL 4 MG/2ML IJ SOLN
4.0000 mg | Freq: Once | INTRAMUSCULAR | Status: AC
Start: 2017-09-26 — End: 2017-09-26
  Administered 2017-09-26: 4 mg via INTRAVENOUS
  Filled 2017-09-26: qty 2

## 2017-09-26 MED ORDER — IOPAMIDOL (ISOVUE-300) INJECTION 61%
30.0000 mL | Freq: Once | INTRAVENOUS | Status: AC | PRN
  Administered 2017-09-26: 30 mL via ORAL

## 2017-09-26 NOTE — ED Notes (Signed)
Patient in CT

## 2017-09-26 NOTE — ED Provider Notes (Signed)
[facility] DEPT Provider Note   CSN: [id number] Arrival date & time: 09/26/17  1255     History   Chief Complaint Chief Complaint  Patient presents with  . Emesis  . Diarrhea  . Dizziness  . Headache    HPI Karen Casey is a 62 y.o. female.  The history is provided by the patient. No language interpreter was used.  Emesis   This is a new problem. The current episode started 2 days ago. The problem occurs continuously. The problem has been gradually worsening. There has been no fever. Associated symptoms include diarrhea and headaches.  Diarrhea   This is a new problem. The current episode started yesterday. The problem has not changed since onset.There has been no fever. Associated symptoms include vomiting and headaches. She has tried nothing for the symptoms. The treatment provided no relief. Risk factors include ill contacts.  Dizziness  Associated symptoms: diarrhea, headaches and vomiting   Headache   Associated symptoms include vomiting.  Pt complains of lower abdominal pain.  Pt reports multiple episodes of vomiting.   Past Medical History:  Diagnosis Date  . Alcohol abuse   . Allergy   . Anxiety   . Cataract 06/09/2012   Right eye and left eye  . Depression   . GERD (gastroesophageal reflux disease)   . 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 Seizures ([place])    xanax withdrawl- December 2013  . Urinary incontinence 06/09/2012    Patient Active Problem List   Diagnosis Date Noted  . Foot pain, bilateral 08/23/2017  . Onychomycosis 01/07/2016  . Allergic conjunctivitis 12/22/2015  . Urinary urgency 12/26/2013  . Broken teeth 12/20/2013  . Back pain 02/14/2013  . Cannabis dependence, unspecified 06/22/2012  . Anxiety disorder 06/12/2012  . Alcohol addiction ([place]) 05/28/2012  . Irritable bowel syndrome 05/10/2012  . Elevated blood pressure reading 05/10/2012  . Smoker 02/17/2012  . GERD  (gastroesophageal reflux disease) 02/04/2012  . Depression 02/04/2012  . Preventative health care 02/01/2012    Past Surgical History:  Procedure Laterality Date  . APPENDECTOMY    . CATARACT EXTRACTION  06/09/2012   Left eye  . left shoulder dislocation  Sept 2011     OB History   None      Home Medications    Prior to Admission medications   Medication Sig Start Date End Date Taking? Authorizing Provider  ALPRAZolam Duanne Moron) 1 MG tablet Take 1 mg by mouth 4 (four) times daily. 08/29/17  Yes [provider]  citalopram (CELEXA) 40 MG tablet Take 40 mg by mouth daily. 09/04/17  Yes [provider]  esomeprazole (NEXIUM) 40 MG capsule TAKE 1 CAPSULE BY MOUTH EVERY MORNING WITH BREAKFAST 08/23/17  Yes Alveda Reasons, MD  fluticasone [facility]) 50 MCG/ACT nasal spray Place 2 sprays into both nostrils daily. 08/23/17  Yes Alveda Reasons, MD  gabapentin (NEURONTIN) 600 MG tablet Take 600 mg by mouth 4 (four) times daily. 09/14/17  Yes [provider]  ranitidine (ZANTAC) 150 MG capsule Take 1 capsule (150 mg total) by mouth 2 (two) times daily. 08/23/17  Yes Alveda Reasons, MD  sucralfate (CARAFATE) 1 g tablet Take 1 tablet (1 g total) by mouth 4 (four) times daily as needed. 08/23/17  Yes Alveda Reasons, MD  tiZANidine (ZANAFLEX) 4 MG tablet Take 4 mg by mouth 2 (two) times daily. 07/27/17  Yes [provider]    Family History  Family History  Problem Relation Age of Onset  . Hypertension Mother   . Hyperlipidemia Mother   . Heart disease Father   . Depression Father   . Parkinsonism Father   . Colon cancer Neg Hx   . Esophageal cancer Neg Hx   . Rectal cancer Neg Hx   . Stomach cancer Neg Hx     Social History Social History   Tobacco Use  . Smoking status: Current Every Day Smoker    Packs/day: 0.50    Years: 37.00    Pack years: 18.50    Types: Cigarettes  . Smokeless tobacco: Never Used  Substance Use Topics  . Alcohol  use: Yes    Alcohol/week: 1.8 oz    Types: 3 Cans of beer per week    Comment: 2-3 times  . Drug use: Yes    Types: Marijuana, Other-see comments    Comment: Past hx of benzo abuse--xanax     Allergies   Patient has no known allergies.   Review of Systems Review of Systems  Gastrointestinal: Positive for diarrhea and vomiting.  Neurological: Positive for dizziness and headaches.  All other systems reviewed and are negative.    Physical Exam Updated Vital Signs BP (!) 175/95 (BP Location: Right Arm)   Pulse 95   Temp 98.2 F (36.8 C) (Oral)   Resp 18   Ht 5\' 5"  (1.651 m)   Wt 80.7 kg (178 lb)   SpO2 95%   BMI 29.62 kg/m   Physical Exam  Constitutional: She is oriented to person, place, and time. She appears well-developed and well-nourished.  HENT:  Head: Normocephalic.  Mouth/Throat: Oropharynx is clear and moist.  Eyes: Pupils are equal, round, and reactive to light. EOM are normal.  Neck: Normal range of motion.  Cardiovascular: Normal rate, regular rhythm and normal heart sounds.  Pulmonary/Chest: Effort normal.  Abdominal: Soft. Bowel sounds are normal. She exhibits no distension. There is tenderness.  Musculoskeletal: Normal range of motion.  Neurological: She is alert and oriented to person, place, and time. She has normal strength.  Skin: Skin is warm.  Psychiatric: She has a normal mood and affect.  Nursing note and vitals reviewed.    ED Treatments / Results  Labs (all labs ordered are listed, but only abnormal results are displayed) Labs Reviewed  COMPREHENSIVE METABOLIC PANEL - Abnormal; Notable for the following components:      Result Value   CO2 21 (*)    Glucose, Bld 117 (*)    All other components within normal limits  CBC - Abnormal; Notable for the following components:   WBC 16.5 (*)    Hemoglobin 15.4 (*)    All other components within normal limits  URINALYSIS, ROUTINE W REFLEX MICROSCOPIC - Abnormal; Notable for the following  components:   Color, Urine AMBER (*)    APPearance HAZY (*)    Hgb urine dipstick SMALL (*)    Ketones, ur 20 (*)    Protein, ur 100 (*)    Squamous Epithelial / LPF 0-5 (*)    All other components within normal limits  LIPASE, BLOOD    EKG None  Radiology Ct Abdomen Pelvis Wo Contrast  Result Date: 09/26/2017 CLINICAL DATA:  Vomiting diarrhea.  Abdominal pain for 2 days EXAM: CT ABDOMEN AND PELVIS WITHOUT CONTRAST TECHNIQUE: Multidetector CT imaging of the abdomen and pelvis was performed following the standard protocol without IV contrast. COMPARISON:  03/09/2013 FINDINGS: Lower chest: Lung bases are clear. Hepatobiliary: No  focal hepatic lesion. No biliary duct dilatation. Gallbladder is normal. Common bile duct is normal. Pancreas: Pancreas is normal. No ductal dilatation. No pancreatic inflammation. Spleen: Normal spleen Adrenals/urinary tract: Adrenal glands normal. No nephrolithiasis ureterolithiasis. Bladder normal. Stomach/Bowel: Stomach, small-bowel, cecum and appendix are normal. The ascending and transverse colon are normal. Severe inflammatory process involving the sigmoid colon. Process involves the mid sigmoid colon and distal sigmoid colon with some soft tissue/fluid between the 2 loops of bowel (image 66/2). No IV or oral contrast. Favor severe diverticulosis however cannot exclude underlying mass lesion. No significant intraperitoneal free air. Small volume free fluid the pelvis. Vascular/Lymphatic: Abdominal aorta is normal caliber. No periportal or retroperitoneal adenopathy. No pelvic adenopathy. Reproductive: Uterus and ovaries normal. Other: Small free fluid the pelvis. Musculoskeletal: No aggressive osseous lesion. IMPRESSION: Extensive inflammatory process within the pelvis associated with 2 loops of sigmoid colon. Favor acute diverticulitis with contained rupture. Cannot exclude underlying neoplasm. Recommend follow-up CT with oral and IV contrast following treatment for  diverticulitis or follow-up colonoscopy if not current for screening. Electronically Signed   By: Suzy Bouchard M.D.   On: 09/26/2017 22:06    Procedures Procedures (including critical care time)  Medications Ordered in ED Medications  iopamidol (ISOVUE-300) 61 % injection (has no administration in time range)  ondansetron (ZOFRAN) injection 4 mg (has no administration in time range)  sodium chloride 0.9 % bolus 1,000 mL (1,000 mLs Intravenous New Bag/Given 09/26/17 2013)  ondansetron (ZOFRAN) injection 4 mg (4 mg Intravenous Given 09/26/17 2014)  iopamidol (ISOVUE-300) 61 % injection 30 mL (30 mLs Oral Contrast Given 09/26/17 2127)     Initial Impression / Assessment and Plan / ED Course  I have reviewed the triage vital signs and the nursing notes.  Pertinent labs & imaging results that were available during my care of the patient were reviewed by me and considered in my medical decision making (see chart for details).     MDM  Ct shows diverticulitis with contained perforation.  Pt is followed by [facility].  [facility] consulted.   Hospitalist consulted for admission  Final Clinical Impressions(s) / ED Diagnoses   Final diagnoses:  Diverticulitis  Perforated diverticulum    ED Discharge Orders    None       Sidney Ace 09/26/17 2317    Pixie Casino, MD 09/26/17 2318

## 2017-09-26 NOTE — ED Triage Notes (Signed)
Per EMS- Patient c/o diarrhea x 2 days, vomiting yesterday, and dizziness today. Patient was given Zofran 4 mg IV prior to arrival to the ED.

## 2017-09-26 NOTE — ED Notes (Signed)
Pt put her hand in her throat to throw up.   Pt stated that she had been doing this method since her childhood to feel better.

## 2017-09-26 NOTE — ED Notes (Signed)
Karen Casey, NT has informed patient that a urine sample is needed.

## 2017-09-26 NOTE — Telephone Encounter (Signed)
Noted  

## 2017-09-27 ENCOUNTER — Encounter (HOSPITAL_COMMUNITY): Payer: Self-pay | Admitting: Surgery

## 2017-09-27 ENCOUNTER — Inpatient Hospital Stay (HOSPITAL_COMMUNITY): Payer: BLUE CROSS/BLUE SHIELD

## 2017-09-27 DIAGNOSIS — K5720 Diverticulitis of large intestine with perforation and abscess without bleeding: Secondary | ICD-10-CM | POA: Diagnosis present

## 2017-09-27 DIAGNOSIS — F17210 Nicotine dependence, cigarettes, uncomplicated: Secondary | ICD-10-CM | POA: Diagnosis present

## 2017-09-27 DIAGNOSIS — K219 Gastro-esophageal reflux disease without esophagitis: Secondary | ICD-10-CM | POA: Diagnosis present

## 2017-09-27 DIAGNOSIS — A419 Sepsis, unspecified organism: Secondary | ICD-10-CM | POA: Diagnosis present

## 2017-09-27 DIAGNOSIS — Z9842 Cataract extraction status, left eye: Secondary | ICD-10-CM | POA: Diagnosis not present

## 2017-09-27 DIAGNOSIS — F1010 Alcohol abuse, uncomplicated: Secondary | ICD-10-CM | POA: Diagnosis present

## 2017-09-27 DIAGNOSIS — Z8249 Family history of ischemic heart disease and other diseases of the circulatory system: Secondary | ICD-10-CM | POA: Diagnosis not present

## 2017-09-27 DIAGNOSIS — K5780 Diverticulitis of intestine, part unspecified, with perforation and abscess without bleeding: Secondary | ICD-10-CM | POA: Diagnosis not present

## 2017-09-27 DIAGNOSIS — K5792 Diverticulitis of intestine, part unspecified, without perforation or abscess without bleeding: Secondary | ICD-10-CM | POA: Diagnosis present

## 2017-09-27 DIAGNOSIS — E876 Hypokalemia: Secondary | ICD-10-CM | POA: Diagnosis present

## 2017-09-27 DIAGNOSIS — F329 Major depressive disorder, single episode, unspecified: Secondary | ICD-10-CM | POA: Diagnosis present

## 2017-09-27 DIAGNOSIS — Z79899 Other long term (current) drug therapy: Secondary | ICD-10-CM | POA: Diagnosis not present

## 2017-09-27 DIAGNOSIS — Z818 Family history of other mental and behavioral disorders: Secondary | ICD-10-CM | POA: Diagnosis not present

## 2017-09-27 DIAGNOSIS — F1029 Alcohol dependence with unspecified alcohol-induced disorder: Secondary | ICD-10-CM | POA: Diagnosis not present

## 2017-09-27 DIAGNOSIS — F419 Anxiety disorder, unspecified: Secondary | ICD-10-CM | POA: Diagnosis present

## 2017-09-27 DIAGNOSIS — Z7951 Long term (current) use of inhaled steroids: Secondary | ICD-10-CM | POA: Diagnosis not present

## 2017-09-27 LAB — COMPREHENSIVE METABOLIC PANEL
ALT: 16 U/L (ref 14–54)
AST: 13 U/L — ABNORMAL LOW (ref 15–41)
Albumin: 3 g/dL — ABNORMAL LOW (ref 3.5–5.0)
Alkaline Phosphatase: 64 U/L (ref 38–126)
Anion gap: 14 (ref 5–15)
BILIRUBIN TOTAL: 0.9 mg/dL (ref 0.3–1.2)
BUN: 8 mg/dL (ref 6–20)
CALCIUM: 8.8 mg/dL — AB (ref 8.9–10.3)
CHLORIDE: 101 mmol/L (ref 101–111)
CO2: 23 mmol/L (ref 22–32)
Creatinine, Ser: 0.62 mg/dL (ref 0.44–1.00)
Glucose, Bld: 110 mg/dL — ABNORMAL HIGH (ref 65–99)
Potassium: 3.1 mmol/L — ABNORMAL LOW (ref 3.5–5.1)
Sodium: 138 mmol/L (ref 135–145)
TOTAL PROTEIN: 6.5 g/dL (ref 6.5–8.1)

## 2017-09-27 LAB — CBC
HCT: 40.6 % (ref 36.0–46.0)
Hemoglobin: 13.3 g/dL (ref 12.0–15.0)
MCH: 31.4 pg (ref 26.0–34.0)
MCHC: 32.8 g/dL (ref 30.0–36.0)
MCV: 95.8 fL (ref 78.0–100.0)
PLATELETS: 209 10*3/uL (ref 150–400)
RBC: 4.24 MIL/uL (ref 3.87–5.11)
RDW: 12 % (ref 11.5–15.5)
WBC: 11 10*3/uL — ABNORMAL HIGH (ref 4.0–10.5)

## 2017-09-27 LAB — MAGNESIUM: MAGNESIUM: 1.8 mg/dL (ref 1.7–2.4)

## 2017-09-27 MED ORDER — POLYVINYL ALCOHOL 1.4 % OP SOLN
1.0000 [drp] | OPHTHALMIC | Status: DC | PRN
  Filled 2017-09-27: qty 15

## 2017-09-27 MED ORDER — ACETAMINOPHEN 325 MG PO TABS
650.0000 mg | ORAL_TABLET | Freq: Four times a day (QID) | ORAL | Status: DC | PRN
  Administered 2017-09-28: 650 mg via ORAL
  Filled 2017-09-27: qty 2

## 2017-09-27 MED ORDER — NICOTINE 21 MG/24HR TD PT24: 21.0000 mg | MEDICATED_PATCH | Freq: Every day | TRANSDERMAL | Status: DC

## 2017-09-27 MED ORDER — ONDANSETRON HCL 4 MG PO TABS: 4.0000 mg | ORAL_TABLET | Freq: Four times a day (QID) | ORAL | Status: DC | PRN

## 2017-09-27 MED ORDER — NICOTINE 21 MG/24HR TD PT24
21.0000 mg | MEDICATED_PATCH | Freq: Every day | TRANSDERMAL | Status: DC
  Administered 2017-09-27 – 2017-09-29 (×3): 21 mg via TRANSDERMAL
  Filled 2017-09-27 (×3): qty 1

## 2017-09-27 MED ORDER — SUCRALFATE 1 G PO TABS
1.0000 g | ORAL_TABLET | Freq: Four times a day (QID) | ORAL | Status: DC | PRN
  Administered 2017-09-29: 1 g via ORAL
  Filled 2017-09-27: qty 1

## 2017-09-27 MED ORDER — VITAMIN B-1 100 MG PO TABS
100.0000 mg | ORAL_TABLET | Freq: Every day | ORAL | Status: DC
  Administered 2017-09-27 – 2017-09-29 (×3): 100 mg via ORAL
  Filled 2017-09-27 (×3): qty 1

## 2017-09-27 MED ORDER — OXYCODONE HCL 5 MG PO TABS: 5.0000 mg | ORAL_TABLET | ORAL | Status: DC | PRN

## 2017-09-27 MED ORDER — LORAZEPAM 2 MG/ML IJ SOLN: 1.0000 mg | Freq: Four times a day (QID) | INTRAMUSCULAR | Status: DC | PRN

## 2017-09-27 MED ORDER — CITALOPRAM HYDROBROMIDE 20 MG PO TABS
40.0000 mg | ORAL_TABLET | Freq: Every day | ORAL | Status: DC
  Administered 2017-09-27 – 2017-09-29 (×3): 40 mg via ORAL
  Filled 2017-09-27 (×3): qty 2

## 2017-09-27 MED ORDER — TIZANIDINE HCL 4 MG PO TABS
4.0000 mg | ORAL_TABLET | Freq: Two times a day (BID) | ORAL | Status: DC
  Administered 2017-09-27 – 2017-09-29 (×6): 4 mg via ORAL
  Filled 2017-09-27 (×6): qty 1

## 2017-09-27 MED ORDER — FLUTICASONE PROPIONATE 50 MCG/ACT NA SUSP
2.0000 | Freq: Every day | NASAL | Status: DC
  Administered 2017-09-27 – 2017-09-29 (×3): 2 via NASAL
  Filled 2017-09-27: qty 16

## 2017-09-27 MED ORDER — IOPAMIDOL (ISOVUE-300) INJECTION 61%: 15.0000 mL | Freq: Once | INTRAVENOUS | Status: AC | PRN

## 2017-09-27 MED ORDER — LORAZEPAM 2 MG/ML IJ SOLN
0.0000 mg | Freq: Two times a day (BID) | INTRAMUSCULAR | Status: DC
  Administered 2017-09-29: 1 mg via INTRAVENOUS
  Filled 2017-09-27: qty 1

## 2017-09-27 MED ORDER — MORPHINE SULFATE (PF) 2 MG/ML IV SOLN: 2.0000 mg | INTRAVENOUS | Status: DC | PRN

## 2017-09-27 MED ORDER — ALPRAZOLAM 1 MG PO TABS
1.0000 mg | ORAL_TABLET | Freq: Four times a day (QID) | ORAL | Status: DC
  Administered 2017-09-27 – 2017-09-29 (×8): 1 mg via ORAL
  Filled 2017-09-27 (×2): qty 1
  Filled 2017-09-27: qty 2
  Filled 2017-09-27 (×5): qty 1

## 2017-09-27 MED ORDER — POTASSIUM CHLORIDE IN NACL 40-0.9 MEQ/L-% IV SOLN
INTRAVENOUS | Status: DC
  Administered 2017-09-27 – 2017-09-29 (×5): 100 mL/h via INTRAVENOUS
  Filled 2017-09-27 (×5): qty 1000

## 2017-09-27 MED ORDER — GABAPENTIN 300 MG PO CAPS
600.0000 mg | ORAL_CAPSULE | Freq: Four times a day (QID) | ORAL | Status: DC
  Administered 2017-09-27 – 2017-09-29 (×9): 600 mg via ORAL
  Filled 2017-09-27 (×9): qty 2

## 2017-09-27 MED ORDER — THIAMINE HCL 100 MG/ML IJ SOLN: 100.0000 mg | Freq: Every day | INTRAMUSCULAR | Status: DC

## 2017-09-27 MED ORDER — ACETAMINOPHEN 650 MG RE SUPP: 650.0000 mg | Freq: Four times a day (QID) | RECTAL | Status: DC | PRN

## 2017-09-27 MED ORDER — ONDANSETRON HCL 4 MG/2ML IJ SOLN
4.0000 mg | Freq: Four times a day (QID) | INTRAMUSCULAR | Status: DC | PRN
  Administered 2017-09-28: 4 mg via INTRAVENOUS
  Filled 2017-09-27: qty 2

## 2017-09-27 MED ORDER — IOHEXOL 300 MG/ML  SOLN
100.0000 mL | Freq: Once | INTRAMUSCULAR | Status: AC | PRN
  Administered 2017-09-27: 100 mL via INTRAVENOUS

## 2017-09-27 MED ORDER — FOLIC ACID 1 MG PO TABS
1.0000 mg | ORAL_TABLET | Freq: Every day | ORAL | Status: DC
  Administered 2017-09-27 – 2017-09-29 (×3): 1 mg via ORAL
  Filled 2017-09-27 (×3): qty 1

## 2017-09-27 MED ORDER — SODIUM CHLORIDE 0.9 % IV SOLN
INTRAVENOUS | Status: DC
  Administered 2017-09-27: 03:00:00 via INTRAVENOUS

## 2017-09-27 MED ORDER — ADULT MULTIVITAMIN W/MINERALS CH
1.0000 | ORAL_TABLET | Freq: Every day | ORAL | Status: DC
  Administered 2017-09-27 – 2017-09-29 (×3): 1 via ORAL
  Filled 2017-09-27 (×3): qty 1

## 2017-09-27 MED ORDER — LORAZEPAM 1 MG PO TABS
1.0000 mg | ORAL_TABLET | Freq: Four times a day (QID) | ORAL | Status: DC | PRN
  Administered 2017-09-28: 1 mg via ORAL
  Filled 2017-09-27: qty 1

## 2017-09-27 MED ORDER — FAMOTIDINE IN NACL 20-0.9 MG/50ML-% IV SOLN
20.0000 mg | Freq: Two times a day (BID) | INTRAVENOUS | Status: DC
  Administered 2017-09-27 – 2017-09-28 (×5): 20 mg via INTRAVENOUS
  Filled 2017-09-27 (×5): qty 50

## 2017-09-27 MED ORDER — IPRATROPIUM BROMIDE 0.02 % IN SOLN: 0.5000 mg | Freq: Four times a day (QID) | RESPIRATORY_TRACT | Status: DC | PRN

## 2017-09-27 MED ORDER — PIPERACILLIN-TAZOBACTAM 3.375 G IVPB
3.3750 g | Freq: Three times a day (TID) | INTRAVENOUS | Status: DC
  Administered 2017-09-27 – 2017-09-29 (×7): 3.375 g via INTRAVENOUS
  Filled 2017-09-27 (×7): qty 50

## 2017-09-27 MED ORDER — ALBUTEROL SULFATE (2.5 MG/3ML) 0.083% IN NEBU: 2.5000 mg | INHALATION_SOLUTION | Freq: Four times a day (QID) | RESPIRATORY_TRACT | Status: DC | PRN

## 2017-09-27 MED ORDER — IOPAMIDOL (ISOVUE-300) INJECTION 61%
INTRAVENOUS | Status: AC
  Filled 2017-09-27: qty 30

## 2017-09-27 MED ORDER — LORAZEPAM 2 MG/ML IJ SOLN
0.0000 mg | Freq: Four times a day (QID) | INTRAMUSCULAR | Status: AC
  Administered 2017-09-27 – 2017-09-28 (×2): 2 mg via INTRAVENOUS
  Administered 2017-09-29: 1 mg via INTRAVENOUS
  Filled 2017-09-27 (×3): qty 1

## 2017-09-27 NOTE — Progress Notes (Signed)
[facility] PROGRESS NOTE    Progress Note  Karen Casey  YJE:[id number] DOB: 1956-05-01 DOA: 09/26/2017 PCP: Alveda Reasons, MD     Brief Narrative:   Karen Casey is an 62 y.o. female past medical history of diverticulosis alcohol abuse anxiety depression, with colonoscopy about 5 years ago and seizures presents to the ED for lower abdominal pain that started 2 days prior to admission  Assessment/Plan:   Sepsis due to acute diverticulitis with perforation of the large intestine: She was started empirically on IV Zosyn and fluid resuscitated. Was placed n.p.o. culture data is pending. General surgery was consulted who agree with medical management and recommended a repeated CT scan with contrast to further evaluate diverticulosis. Keep the patient n.p.o. for possible surgical intervention.  Anxiety/depression: Continue Xanax and Celexa.  GERD: Continue Nexium and Carafate.  Alcohol abuse: Continue monitor with serial protocol.    DVT prophylaxis: lovenox Family Communication:none Disposition Plan/Barrier to D/C: unable to determine Code Status:     Code Status Orders  (From admission, onward)        Start     Ordered   09/27/17 0216  Full code  Continuous     09/27/17 0215    Code Status History    Date Active Date Inactive Code Status Order ID Comments User Context   08/03/2012 1943 08/04/2012 0545 Full Code [id number]  Julieta Bellini, NP ED   07/11/2012 0006 07/11/2012 1553 Full Code [id number]  Teofilo Pod, PA-C ED   05/25/2012 2044 05/26/2012 0913 Full Code [id number]  Margarita Mail, PA-C ED        IV Access:    Peripheral IV   Procedures and diagnostic studies:   Ct Abdomen Pelvis Wo Contrast  Result Date: 09/26/2017 CLINICAL DATA:  Vomiting diarrhea.  Abdominal pain for 2 days EXAM: CT ABDOMEN AND PELVIS WITHOUT CONTRAST TECHNIQUE: Multidetector CT imaging of the abdomen and pelvis was performed following the standard  protocol without IV contrast. COMPARISON:  03/09/2013 FINDINGS: Lower chest: Lung bases are clear. Hepatobiliary: No focal hepatic lesion. No biliary duct dilatation. Gallbladder is normal. Common bile duct is normal. Pancreas: Pancreas is normal. No ductal dilatation. No pancreatic inflammation. Spleen: Normal spleen Adrenals/urinary tract: Adrenal glands normal. No nephrolithiasis ureterolithiasis. Bladder normal. Stomach/Bowel: Stomach, small-bowel, cecum and appendix are normal. The ascending and transverse colon are normal. Severe inflammatory process involving the sigmoid colon. Process involves the mid sigmoid colon and distal sigmoid colon with some soft tissue/fluid between the 2 loops of bowel (image 66/2). No IV or oral contrast. Favor severe diverticulosis however cannot exclude underlying mass lesion. No significant intraperitoneal free air. Small volume free fluid the pelvis. Vascular/Lymphatic: Abdominal aorta is normal caliber. No periportal or retroperitoneal adenopathy. No pelvic adenopathy. Reproductive: Uterus and ovaries normal. Other: Small free fluid the pelvis. Musculoskeletal: No aggressive osseous lesion. IMPRESSION: Extensive inflammatory process within the pelvis associated with 2 loops of sigmoid colon. Favor acute diverticulitis with contained rupture. Cannot exclude underlying neoplasm. Recommend follow-up CT with oral and IV contrast following treatment for diverticulitis or follow-up colonoscopy if not current for screening. Electronically Signed   By: Suzy Bouchard M.D.   On: 09/26/2017 22:06     Medical Consultants:    None.  Anti-Infectives:   IV Zosyn  Subjective:    Karen Casey she relates she continues to have abdominal pain which is tolerable is hungry.  She is on nicotine patch  Objective:    Vitals:   09/27/17 0019  09/27/17 0224 09/27/17 0500 09/27/17 0800  BP: (!) 154/75 122/79    Pulse: 88 78  74  Resp: 11 13    Temp:  97.8 F (36.6 C)      TempSrc:  Oral    SpO2: 95% 97%    Weight:   81.2 kg (179 lb)   Height:   5\' 5"  (1.651 m)     Intake/Output Summary (Last 24 hours) at 09/27/2017 0837 Last data filed at 09/27/2017 0602 Gross per 24 hour  Intake 1338.33 ml  Output -  Net 1338.33 ml   Filed Weights   09/26/17 1314 09/27/17 0500  Weight: 80.7 kg (178 lb) 81.2 kg (179 lb)    Exam: General exam: In no acute distress. Respiratory system: Good air movement and clear to auscultation. Cardiovascular system: S1 & S2 heard, RRR.  Gastrointestinal system: The bowel sounds soft with left lower quadrant tenderness no rebound or guarding. Central nervous system: Alert and oriented. No focal neurological deficits. Extremities: No pedal edema. Skin: No rashes, lesions or ulcers Psychiatry: Judgement and insight appear normal. Mood & affect appropriate.    Data Reviewed:    Labs: Basic Metabolic Panel: Recent Labs  Lab 09/26/17 1325 09/27/17 0441  NA 136 138  K 3.5 3.1*  CL 101 101  CO2 21* 23  GLUCOSE 117* 110*  BUN 10 8  CREATININE 0.67 0.62  CALCIUM 9.2 8.8*   GFR Estimated Creatinine Clearance: 79.7 mL/min (by C-G formula based on SCr of 0.62 mg/dL). Liver Function Tests: Recent Labs  Lab 09/26/17 1325 09/27/17 0441  AST 15 13*  ALT 19 16  ALKPHOS 73 64  BILITOT 1.0 0.9  PROT 7.8 6.5  ALBUMIN 3.8 3.0*   Recent Labs  Lab 09/26/17 1325  LIPASE 28   No results for input(s): AMMONIA in the last 168 hours. Coagulation profile No results for input(s): INR, PROTIME in the last 168 hours.  CBC: Recent Labs  Lab 09/26/17 1325 09/27/17 0441  WBC 16.5* 11.0*  HGB 15.4* 13.3  HCT 45.6 40.6  MCV 95.4 95.8  PLT 206 209   Cardiac Enzymes: No results for input(s): CKTOTAL, CKMB, CKMBINDEX, TROPONINI in the last 168 hours. BNP (last 3 results) No results for input(s): PROBNP in the last 8760 hours. CBG: No results for input(s): GLUCAP in the last 168 hours. D-Dimer: No results for input(s):  DDIMER in the last 72 hours. Hgb A1c: No results for input(s): HGBA1C in the last 72 hours. Lipid Profile: No results for input(s): CHOL, HDL, LDLCALC, TRIG, CHOLHDL, LDLDIRECT in the last 72 hours. Thyroid function studies: No results for input(s): TSH, T4TOTAL, T3FREE, THYROIDAB in the last 72 hours.  Invalid input(s): FREET3 Anemia work up: No results for input(s): VITAMINB12, FOLATE, FERRITIN, TIBC, IRON, RETICCTPCT in the last 72 hours. Sepsis Labs: Recent Labs  Lab 09/26/17 1325 09/27/17 0441  WBC 16.5* 11.0*   Microbiology No results found for this or any previous visit (from the past 240 hour(s)).   Medications:   . ALPRAZolam  1 mg Oral QID  . citalopram  40 mg Oral Daily  . fluticasone  2 spray Each Nare Daily  . folic acid  1 mg Oral Daily  . gabapentin  600 mg Oral QID  . LORazepam  0-4 mg Intravenous Q6H   Followed by  . [START ON 09/29/2017] LORazepam  0-4 mg Intravenous Q12H  . multivitamin with minerals  1 tablet Oral Daily  . nicotine  21 mg Transdermal Daily  .  thiamine  100 mg Oral Daily   Or  . thiamine  100 mg Intravenous Daily  . tiZANidine  4 mg Oral BID   Continuous Infusions: . sodium chloride 100 mL/hr at 09/27/17 0309  . famotidine (PEPCID) IV Stopped (09/27/17 0341)  . piperacillin-tazobactam (ZOSYN)  IV 3.375 g (09/27/17 0711)      LOS: 0 days   Charlynne Cousins  [facility] Pager [phone]  *Please refer to [email or web address], password [email or web address] to get updated schedule on who will round on this patient, as hospitalists switch teams weekly. If 7PM-7AM, please contact night-coverage at [email or web address], password [email or web address] for any overnight needs.  09/27/2017, 8:37 AM

## 2017-09-27 NOTE — Consult Note (Signed)
General Surgery [facility]  Reason for Consult: abdominal pain, diverticulitis with possible perforation  Referring Physician: Dr. Ara Kussmaul, [facility]  Karen Casey is an 62 y.o. female.  HPI: patient is a 62 yo WF with two day hx of abdominal pain. Denies nausea or emesis.  Having diarrhea.  Denies fever or chills.  Presents to ER.  Elevated WBC 16K.  CTA with evidence acute diverticulitis with fluid collection in pelvis suspicious for perforation.  No free air.  Colonoscopy approx 5 years ago.  Prior appendectomy for perforated appendicitis (laparoscopic).  Past Medical History:  Diagnosis Date  . Alcohol abuse   . Allergy   . Anxiety   . Cataract 06/09/2012   Right eye and left eye  . Depression   . GERD (gastroesophageal reflux disease)   . 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 Seizures ([place])    xanax withdrawl- December 2013  . Urinary incontinence 06/09/2012    Past Surgical History:  Procedure Laterality Date  . APPENDECTOMY    . CATARACT EXTRACTION  06/09/2012   Left eye  . left shoulder dislocation  Sept 2011    Family History  Problem Relation Age of Onset  . Hypertension Mother   . Hyperlipidemia Mother   . Heart disease Father   . Depression Father   . Parkinsonism Father   . Colon cancer Neg Hx   . Esophageal cancer Neg Hx   . Rectal cancer Neg Hx   . Stomach cancer Neg Hx     Social History:  reports that she has been smoking cigarettes.  She has a 18.50 pack-year smoking history. She has never used smokeless tobacco. She reports that she drinks about 1.8 oz of alcohol per week. She reports that she has current or past drug history. Drugs: Marijuana and Other-see comments.  Allergies: No Known Allergies  Medications: I have reviewed the patient's current medications.  Results for orders placed or performed during the hospital encounter of 09/26/17 (from the past 48 hour(s))  Lipase, blood      Status: None   Collection Time: 09/26/17  1:25 PM  Result Value Ref Range   Lipase 28 11 - 51 U/L    Comment: Performed at [facility], [place] [address], [place], [place] [address]  Comprehensive metabolic panel     Status: Abnormal   Collection Time: 09/26/17  1:25 PM  Result Value Ref Range   Sodium 136 135 - 145 mmol/L   Potassium 3.5 3.5 - 5.1 mmol/L   Chloride 101 101 - 111 mmol/L   CO2 21 (L) 22 - 32 mmol/L   Glucose, Bld 117 (H) 65 - 99 mg/dL   BUN 10 6 - 20 mg/dL   Creatinine, Ser 0.67 0.44 - 1.00 mg/dL   Calcium 9.2 8.9 - 10.3 mg/dL   Total Protein 7.8 6.5 - 8.1 g/dL   Albumin 3.8 3.5 - 5.0 g/dL   AST 15 15 - 41 U/L   ALT 19 14 - 54 U/L   Alkaline Phosphatase 73 38 - 126 U/L   Total Bilirubin 1.0 0.3 - 1.2 mg/dL   GFR calc non Af Amer >60 >60 mL/min   GFR calc Af Amer >60 >60 mL/min    Comment: (NOTE) The eGFR has been calculated using the CKD EPI equation. This calculation has not been validated in all clinical situations. eGFR's persistently <60 mL/min signify possible Chronic Kidney Disease.    Anion  gap 14 5 - 15    Comment: Performed at [facility], [place] [address], [place], [place] [address]  CBC     Status: Abnormal   Collection Time: 09/26/17  1:25 PM  Result Value Ref Range   WBC 16.5 (H) 4.0 - 10.5 K/uL   RBC 4.78 3.87 - 5.11 MIL/uL   Hemoglobin 15.4 (H) 12.0 - 15.0 g/dL   HCT 45.6 36.0 - 46.0 %   MCV 95.4 78.0 - 100.0 fL   MCH 32.2 26.0 - 34.0 pg   MCHC 33.8 30.0 - 36.0 g/dL   RDW 12.0 11.5 - 15.5 %   Platelets 206 150 - 400 K/uL    Comment: Performed at [facility], [place] [address] [place], [place] [address]  Urinalysis, Routine w reflex microscopic     Status: Abnormal   Collection Time: 09/26/17  9:24 PM  Result Value Ref Range   Color, Urine AMBER (A) YELLOW    Comment: BIOCHEMICALS MAY BE AFFECTED BY COLOR   APPearance HAZY (A) CLEAR   Specific Gravity, Urine 1.024 1.005 - 1.030    pH 5.0 5.0 - 8.0   Glucose, UA NEGATIVE NEGATIVE mg/dL   Hgb urine dipstick SMALL (A) NEGATIVE   Bilirubin Urine NEGATIVE NEGATIVE   Ketones, ur 20 (A) NEGATIVE mg/dL   Protein, ur 100 (A) NEGATIVE mg/dL   Nitrite NEGATIVE NEGATIVE   Leukocytes, UA NEGATIVE NEGATIVE   RBC / HPF 6-30 0 - 5 RBC/hpf   WBC, UA 0-5 0 - 5 WBC/hpf   Bacteria, UA NONE SEEN NONE SEEN   Squamous Epithelial / LPF 0-5 (A) NONE SEEN   Mucus PRESENT    Hyaline Casts, UA PRESENT     Comment: Performed at [facility], [place] [address] [place], [place] [address]    Ct Abdomen Pelvis Wo Contrast  Result Date: 09/26/2017 CLINICAL DATA:  Vomiting diarrhea.  Abdominal pain for 2 days EXAM: CT ABDOMEN AND PELVIS WITHOUT CONTRAST TECHNIQUE: Multidetector CT imaging of the abdomen and pelvis was performed following the standard protocol without IV contrast. COMPARISON:  03/09/2013 FINDINGS: Lower chest: Lung bases are clear. Hepatobiliary: No focal hepatic lesion. No biliary duct dilatation. Gallbladder is normal. Common bile duct is normal. Pancreas: Pancreas is normal. No ductal dilatation. No pancreatic inflammation. Spleen: Normal spleen Adrenals/urinary tract: Adrenal glands normal. No nephrolithiasis ureterolithiasis. Bladder normal. Stomach/Bowel: Stomach, small-bowel, cecum and appendix are normal. The ascending and transverse colon are normal. Severe inflammatory process involving the sigmoid colon. Process involves the mid sigmoid colon and distal sigmoid colon with some soft tissue/fluid between the 2 loops of bowel (image 66/2). No IV or oral contrast. Favor severe diverticulosis however cannot exclude underlying mass lesion. No significant intraperitoneal free air. Small volume free fluid the pelvis. Vascular/Lymphatic: Abdominal aorta is normal caliber. No periportal or retroperitoneal adenopathy. No pelvic adenopathy. Reproductive: Uterus and ovaries normal. Other: Small free fluid the pelvis.  Musculoskeletal: No aggressive osseous lesion. IMPRESSION: Extensive inflammatory process within the pelvis associated with 2 loops of sigmoid colon. Favor acute diverticulitis with contained rupture. Cannot exclude underlying neoplasm. Recommend follow-up CT with oral and IV contrast following treatment for diverticulitis or follow-up colonoscopy if not current for screening. Electronically Signed   By: Suzy Bouchard M.D.   On: 09/26/2017 22:06    Review of Systems  Constitutional: Negative.   HENT: Negative.   Eyes: Negative.   Respiratory: Positive for cough.   Cardiovascular: Negative.   Gastrointestinal: Positive  for abdominal pain, diarrhea and heartburn.  Genitourinary: Negative.   Musculoskeletal: Negative.   Skin: Negative.   Neurological: Negative.   Endo/Heme/Allergies: Negative.   Psychiatric/Behavioral: Negative.    Blood pressure 122/79, pulse 78, temperature 97.8 F (36.6 C), temperature source Oral, resp. rate 13, height '5\' 5"'  (1.651 m), weight 80.7 kg (178 lb), SpO2 97 %. Physical Exam  Constitutional: She is oriented to person, place, and time. She appears well-developed and well-nourished. No distress.  HENT:  Head: Normocephalic and atraumatic.  Right Ear: External ear normal.  Left Ear: External ear normal.  Mouth/Throat: No oropharyngeal exudate.  Eyes: Pupils are equal, round, and reactive to light. Conjunctivae are normal. No scleral icterus.  Neck: Normal range of motion. Neck supple. No tracheal deviation present. No thyromegaly present.  Cardiovascular: Normal rate, regular rhythm and normal heart sounds.  No murmur heard. Respiratory: Effort normal. No respiratory distress. She has wheezes. She has no rales. She exhibits no tenderness.  GI: Soft. Bowel sounds are normal. She exhibits no distension and no mass. There is tenderness (mild lower abd). There is no rebound and no guarding.  Musculoskeletal: Normal range of motion. She exhibits no edema,  tenderness or deformity.  Lymphadenopathy:    She has no cervical adenopathy.  Neurological: She is alert and oriented to person, place, and time.  Skin: Skin is warm and dry. She is not diaphoretic.  Psychiatric: She has a normal mood and affect. Her behavior is normal.    Assessment/Plan: Acute diverticulitis, rule out perforation vs neoplasm (per CT scan reading)  Admit to medical service  Sepsis protocol, IV abx's, fluid resuscitation  NPO, IVF  Will require additional imaging with contrast to better evaluate extent of diverticular disease and possible perforation vs neoplasm in distal sigmoid colon  Surgical service will follow with you - discuss possible need for operative intervention with patient, including possibility of colostomy  Armandina Gemma, MD [facility] Office: [place] 09/27/2017, 3:06 AM

## 2017-09-27 NOTE — H&P (Signed)
History and Physical   [facility] - [place] [facility] [place] Admission History and Physical [employer], D.O.    Patient Name: Karen Casey MR#: [id number] Date of Birth: 20-Jul-1955 Date of Admission: 09/26/2017  Referring MD/NP/PA: [place] Primary Care Physician: Alveda Reasons, MD  Chief Complaint:  Chief Complaint  Patient presents with  . Emesis  . Diarrhea  . Dizziness  . Headache    HPI: Karen Casey is a 62 y.o. female with a known history of EtOH, anxiety, depression, GERD, seizure presents to the emergency department for evaluation of lower abdominal pain.  Patient was in a usual state of health until two days ago when she developed lower abdominal pain which is different than her chronic upper abdominal pain from GERD.   Her pain is associated with diarrhea, vomiting, non bloody non-bilious. She has had a colonoscopy 5 years ago and has ben diagnosed with diverticulosis.  Patient denies fevers/chills, weakness, dizziness, chest pain, shortness of breath,  , dysuria/frequency, changes in mental status.    Otherwise there has been no change in status. Patient has been taking medication as prescribed and there has been no recent change in medication or diet.  No recent antibiotics.  There has been no recent illness, hospitalizations, travel or sick contacts.    EMS/ED Course: Patient received Zosyn, Zofran, IVFs. Medical admission has been requested for further management of acute diverticulitis with perforation.  Review of Systems:  CONSTITUTIONAL: No fever/chills, fatigue, weakness, weight gain/loss, headache. EYES: No blurry or double vision. ENT: No tinnitus, postnasal drip, redness or soreness of the oropharynx. RESPIRATORY: No cough, dyspnea, wheeze.  No hemoptysis.  CARDIOVASCULAR: No chest pain, palpitations, syncope, orthopnea. No lower extremity edema.  GASTROINTESTINAL: Positive nausea, vomiting, abdominal pain, diarrhea. Negative  constipation.  No hematemesis, melena or hematochezia. GENITOURINARY: No dysuria, frequency, hematuria. ENDOCRINE: No polyuria or nocturia. No heat or cold intolerance. HEMATOLOGY: No anemia, bruising, bleeding. INTEGUMENTARY: No rashes, ulcers, lesions. MUSCULOSKELETAL: No arthritis, gout, dyspnea. NEUROLOGIC: No numbness, tingling, ataxia, seizure-type activity, weakness. PSYCHIATRIC: No anxiety, depression, insomnia.   Past Medical History:  Diagnosis Date  . Alcohol abuse   . Allergy   . Anxiety   . Cataract 06/09/2012   Right eye and left eye  . Depression   . GERD (gastroesophageal reflux disease)   . 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 Seizures ([place])    xanax withdrawl- December 2013  . Urinary incontinence 06/09/2012    Past Surgical History:  Procedure Laterality Date  . APPENDECTOMY    . CATARACT EXTRACTION  06/09/2012   Left eye  . left shoulder dislocation  Sept 2011     reports that she has been smoking cigarettes.  She has a 18.50 pack-year smoking history. She has never used smokeless tobacco. She reports that she drinks about 1.8 oz of alcohol per week. She reports that she has current or past drug history. Drugs: Marijuana and Other-see comments.  No Known Allergies  Family History  Problem Relation Age of Onset  . Hypertension Mother   . Hyperlipidemia Mother   . Heart disease Father   . Depression Father   . Parkinsonism Father   . Colon cancer Neg Hx   . Esophageal cancer Neg Hx   . Rectal cancer Neg Hx   . Stomach cancer Neg Hx     Prior to Admission medications   Medication Sig Start Date End Date Taking? Authorizing Provider  ALPRAZolam Duanne Moron)  1 MG tablet Take 1 mg by mouth 4 (four) times daily. 08/29/17  Yes [provider]  citalopram (CELEXA) 40 MG tablet Take 40 mg by mouth daily. 09/04/17  Yes [provider]  esomeprazole (NEXIUM) 40 MG capsule TAKE 1 CAPSULE BY MOUTH EVERY MORNING WITH  BREAKFAST 08/23/17  Yes Alveda Reasons, MD  fluticasone [facility]) 50 MCG/ACT nasal spray Place 2 sprays into both nostrils daily. 08/23/17  Yes Alveda Reasons, MD  gabapentin (NEURONTIN) 600 MG tablet Take 600 mg by mouth 4 (four) times daily. 09/14/17  Yes [provider]  ranitidine (ZANTAC) 150 MG capsule Take 1 capsule (150 mg total) by mouth 2 (two) times daily. 08/23/17  Yes Alveda Reasons, MD  sucralfate (CARAFATE) 1 g tablet Take 1 tablet (1 g total) by mouth 4 (four) times daily as needed. 08/23/17  Yes Alveda Reasons, MD  tiZANidine (ZANAFLEX) 4 MG tablet Take 4 mg by mouth 2 (two) times daily. 07/27/17  Yes [provider]    Physical Exam: Vitals:   09/26/17 1319 09/26/17 1547 09/26/17 2016 09/26/17 2208  BP: (!) 176/102 (!) 170/86 (!) 156/96 (!) 175/95  Pulse:  (!) 114 89 95  Resp:  20 (!) 23 18  Temp:  98.6 F (37 C) 98.2 F (36.8 C)   TempSrc:  Oral Oral   SpO2:  98% 97% 95%  Weight:      Height:        GENERAL: 62 y.o.-year-old female patient, well-developed, well-nourished lying in the bed in no acute distress.  Pleasant and cooperative.   HEENT: Head atraumatic, normocephalic. Pupils equal. Mucus membranes moist. NECK: Supple, full range of motion. No JVD, no bruit heard. No thyroid enlargement, no tenderness, no cervical lymphadenopathy. CHEST: Normal breath sounds bilaterally. No wheezing, rales, rhonchi or crackles. No use of accessory muscles of respiration.  No reproducible chest wall tenderness.  CARDIOVASCULAR: S1, S2 normal. No murmurs, rubs, or gallops. Cap refill <2 seconds. Pulses intact distally.  ABDOMEN: Soft, nondistended, diffusely tender worst at LLQ. No rebound, guarding, rigidity. Normoactive bowel sounds present in all four quadrants.  EXTREMITIES: No pedal edema, cyanosis, or clubbing. No calf tenderness or Homan's sign.  NEUROLOGIC: The patient is alert and oriented x 3. Cranial nerves II through XII are grossly intact with  no focal sensorimotor deficit. PSYCHIATRIC:  Normal affect, mood, thought content. SKIN: Warm, dry, and intact without obvious rash, lesion, or ulcer.    Labs on Admission:  CBC: Recent Labs  Lab 09/26/17 1325  WBC 16.5*  HGB 15.4*  HCT 45.6  MCV 95.4  PLT 458   Basic Metabolic Panel: Recent Labs  Lab 09/26/17 1325  NA 136  K 3.5  CL 101  CO2 21*  GLUCOSE 117*  BUN 10  CREATININE 0.67  CALCIUM 9.2   GFR: Estimated Creatinine Clearance: 79.5 mL/min (by C-G formula based on SCr of 0.67 mg/dL). Liver Function Tests: Recent Labs  Lab 09/26/17 1325  AST 15  ALT 19  ALKPHOS 73  BILITOT 1.0  PROT 7.8  ALBUMIN 3.8   Recent Labs  Lab 09/26/17 1325  LIPASE 28   No results for input(s): AMMONIA in the last 168 hours. Coagulation Profile: No results for input(s): INR, PROTIME in the last 168 hours. Cardiac Enzymes: No results for input(s): CKTOTAL, CKMB, CKMBINDEX, TROPONINI in the last 168 hours. BNP (last 3 results) No results for input(s): PROBNP in the last 8760 hours. HbA1C: No results for input(s): HGBA1C in  the last 72 hours. CBG: No results for input(s): GLUCAP in the last 168 hours. Lipid Profile: No results for input(s): CHOL, HDL, LDLCALC, TRIG, CHOLHDL, LDLDIRECT in the last 72 hours. Thyroid Function Tests: No results for input(s): TSH, T4TOTAL, FREET4, T3FREE, THYROIDAB in the last 72 hours. Anemia Panel: No results for input(s): VITAMINB12, FOLATE, FERRITIN, TIBC, IRON, RETICCTPCT in the last 72 hours. Urine analysis:    Component Value Date/Time   COLORURINE AMBER (A) 09/26/2017 2124   APPEARANCEUR HAZY (A) 09/26/2017 2124   LABSPEC 1.024 09/26/2017 2124   PHURINE 5.0 09/26/2017 2124   GLUCOSEU NEGATIVE 09/26/2017 2124   HGBUR SMALL (A) 09/26/2017 2124   BILIRUBINUR NEGATIVE 09/26/2017 2124   BILIRUBINUR negative 08/23/2017 0936   BILIRUBINUR NEG 12/22/2015 1410   KETONESUR 20 (A) 09/26/2017 2124   PROTEINUR 100 (A) 09/26/2017 2124    UROBILINOGEN 0.2 08/23/2017 0936   UROBILINOGEN 1.0 03/09/2013 1013   NITRITE NEGATIVE 09/26/2017 2124   LEUKOCYTESUR NEGATIVE 09/26/2017 2124   Sepsis Labs: @LABRCNTIP (procalcitonin:4,lacticidven:4) )No results found for this or any previous visit (from the past 240 hour(s)).   Radiological Exams on Admission: Ct Abdomen Pelvis Wo Contrast  Result Date: 09/26/2017 CLINICAL DATA:  Vomiting diarrhea.  Abdominal pain for 2 days EXAM: CT ABDOMEN AND PELVIS WITHOUT CONTRAST TECHNIQUE: Multidetector CT imaging of the abdomen and pelvis was performed following the standard protocol without IV contrast. COMPARISON:  03/09/2013 FINDINGS: Lower chest: Lung bases are clear. Hepatobiliary: No focal hepatic lesion. No biliary duct dilatation. Gallbladder is normal. Common bile duct is normal. Pancreas: Pancreas is normal. No ductal dilatation. No pancreatic inflammation. Spleen: Normal spleen Adrenals/urinary tract: Adrenal glands normal. No nephrolithiasis ureterolithiasis. Bladder normal. Stomach/Bowel: Stomach, small-bowel, cecum and appendix are normal. The ascending and transverse colon are normal. Severe inflammatory process involving the sigmoid colon. Process involves the mid sigmoid colon and distal sigmoid colon with some soft tissue/fluid between the 2 loops of bowel (image 66/2). No IV or oral contrast. Favor severe diverticulosis however cannot exclude underlying mass lesion. No significant intraperitoneal free air. Small volume free fluid the pelvis. Vascular/Lymphatic: Abdominal aorta is normal caliber. No periportal or retroperitoneal adenopathy. No pelvic adenopathy. Reproductive: Uterus and ovaries normal. Other: Small free fluid the pelvis. Musculoskeletal: No aggressive osseous lesion. IMPRESSION: Extensive inflammatory process within the pelvis associated with 2 loops of sigmoid colon. Favor acute diverticulitis with contained rupture. Cannot exclude underlying neoplasm. Recommend follow-up CT  with oral and IV contrast following treatment for diverticulitis or follow-up colonoscopy if not current for screening. Electronically Signed   By: Suzy Bouchard M.D.   On: 09/26/2017 22:06     Assessment/Plan  This is a 62 y.o. female with a history of EtOH, anxiety, depression, GERD, seizure (BZD withdrawal) now being admitted with:  #. Sepsis secondary to acute diverticulitis - Admit to inpatient with telemetry monitoring - IV antibiotics: Zosyn - IV fluid hydration - Follow up stool culture - NPO - Repeat CBC in am.  - Surgical consultation has been requested - D/W Dr. Harlow Asa, will review scan and see patient in AM.  #. History of anxiety/depression - Continue Xanax, Celexa  #. History of GERD - Continue Nexium, Carafate, Zantac  #. History of EtOH, currently drinks 4 beers daily - CIWA protocol  #. Tobacco use - Nicotine patch  Admission status: Inpatient, tele IV Fluids: NS Diet/Nutrition: Clear liquids, advance as tolerated  Consults called: Surgery  DVT Px: SCDs and early ambulation. Code Status: Full Code  Disposition Plan: To  home in 2-3 days  All the records are reviewed and case discussed with ED provider. Management plans discussed with the patient and/or family who express understanding and agree with plan of care.  Diyana Starrett D.O. on 09/27/2017 at 12:02 AM CC: Primary care physician; Alveda Reasons, MD   09/27/2017, 12:02 AM

## 2017-09-27 NOTE — ED Notes (Signed)
ED TO INPATIENT HANDOFF REPORT  Name/Age/Gender Karen Casey 62 y.o. female  Code Status Code Status History    Date Active Date Inactive Code Status Order ID Comments User Context   08/03/2012 1943 08/04/2012 0545 Full Code [id number]  Julieta Bellini, NP ED   07/11/2012 0006 07/11/2012 1553 Full Code [id number]  Teofilo Pod, PA-C ED   05/25/2012 2044 05/26/2012 0913 Full Code [id number]  [facility]      Home/SNF/Other Home  Chief Complaint Nausea; Emesis; Diarrhea  Level of Care/Admitting Diagnosis ED Disposition    ED Disposition Condition [facility] Area: [place] [[id number]]  Level of Care: Telemetry [5]  Admit to tele based on following criteria: Other see comments  Comments: Sepsis  Diagnosis: Sepsis [facility]) [[id number]]  Admitting Physician: Harvie Bridge [[id number]]  Attending Physician: Harvie Bridge [[id number]]  Estimated length of stay: past midnight tomorrow  Certification:: I certify this patient will need inpatient services for at least 2 midnights  PT Class (Do Not Modify): Inpatient [101]  PT Acc Code (Do Not Modify): Private [1]       Medical History Past Medical History:  Diagnosis Date  . Alcohol abuse   . Allergy   . Anxiety   . Cataract 06/09/2012   Right eye and left eye  . Depression   . GERD (gastroesophageal reflux disease)   . 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 Seizures ([place])    xanax withdrawl- December 2013  . Urinary incontinence 06/09/2012    Allergies No Known Allergies  IV Location/Drains/Wounds Patient Lines/Drains/Airways Status   Active Line/Drains/Airways    Name:   Placement date:   Placement time:   Site:   Days:   Peripheral IV 09/26/17 Left Forearm   09/26/17    2249    Forearm   1          Labs/Imaging Results for orders placed or performed during the hospital encounter of 09/26/17 (from the past 48 hour(s))  Lipase, blood      Status: None   Collection Time: 09/26/17  1:25 PM  Result Value Ref Range   Lipase 28 11 - 51 U/L    Comment: Performed at [facility], [place] [address] [place], [place] [address]  Comprehensive metabolic panel     Status: Abnormal   Collection Time: 09/26/17  1:25 PM  Result Value Ref Range   Sodium 136 135 - 145 mmol/L   Potassium 3.5 3.5 - 5.1 mmol/L   Chloride 101 101 - 111 mmol/L   CO2 21 (L) 22 - 32 mmol/L   Glucose, Bld 117 (H) 65 - 99 mg/dL   BUN 10 6 - 20 mg/dL   Creatinine, Ser 0.67 0.44 - 1.00 mg/dL   Calcium 9.2 8.9 - 10.3 mg/dL   Total Protein 7.8 6.5 - 8.1 g/dL   Albumin 3.8 3.5 - 5.0 g/dL   AST 15 15 - 41 U/L   ALT 19 14 - 54 U/L   Alkaline Phosphatase 73 38 - 126 U/L   Total Bilirubin 1.0 0.3 - 1.2 mg/dL   GFR calc non Af Amer >60 >60 mL/min   GFR calc Af Amer >60 >60 mL/min    Comment: (NOTE) The eGFR has been calculated using the CKD EPI equation. This calculation has not been validated in all clinical situations. eGFR's persistently <60 mL/min signify possible Chronic Kidney Disease.    Anion gap 14  5 - 15    Comment: Performed at [facility], [place] [address], [place], [place] [address]  CBC     Status: Abnormal   Collection Time: 09/26/17  1:25 PM  Result Value Ref Range   WBC 16.5 (H) 4.0 - 10.5 K/uL   RBC 4.78 3.87 - 5.11 MIL/uL   Hemoglobin 15.4 (H) 12.0 - 15.0 g/dL   HCT 45.6 36.0 - 46.0 %   MCV 95.4 78.0 - 100.0 fL   MCH 32.2 26.0 - 34.0 pg   MCHC 33.8 30.0 - 36.0 g/dL   RDW 12.0 11.5 - 15.5 %   Platelets 206 150 - 400 K/uL    Comment: Performed at [facility], [place] [address] [place], [place] [address]  Urinalysis, Routine w reflex microscopic     Status: Abnormal   Collection Time: 09/26/17  9:24 PM  Result Value Ref Range   Color, Urine AMBER (A) YELLOW    Comment: BIOCHEMICALS MAY BE AFFECTED BY COLOR   APPearance HAZY (A) CLEAR   Specific Gravity, Urine 1.024 1.005 - 1.030   pH  5.0 5.0 - 8.0   Glucose, UA NEGATIVE NEGATIVE mg/dL   Hgb urine dipstick SMALL (A) NEGATIVE   Bilirubin Urine NEGATIVE NEGATIVE   Ketones, ur 20 (A) NEGATIVE mg/dL   Protein, ur 100 (A) NEGATIVE mg/dL   Nitrite NEGATIVE NEGATIVE   Leukocytes, UA NEGATIVE NEGATIVE   RBC / HPF 6-30 0 - 5 RBC/hpf   WBC, UA 0-5 0 - 5 WBC/hpf   Bacteria, UA NONE SEEN NONE SEEN   Squamous Epithelial / LPF 0-5 (A) NONE SEEN   Mucus PRESENT    Hyaline Casts, UA PRESENT     Comment: Performed at [facility], [place] [address], [place], [place] [address]   Ct Abdomen Pelvis Wo Contrast  Result Date: 09/26/2017 CLINICAL DATA:  Vomiting diarrhea.  Abdominal pain for 2 days EXAM: CT ABDOMEN AND PELVIS WITHOUT CONTRAST TECHNIQUE: Multidetector CT imaging of the abdomen and pelvis was performed following the standard protocol without IV contrast. COMPARISON:  03/09/2013 FINDINGS: Lower chest: Lung bases are clear. Hepatobiliary: No focal hepatic lesion. No biliary duct dilatation. Gallbladder is normal. Common bile duct is normal. Pancreas: Pancreas is normal. No ductal dilatation. No pancreatic inflammation. Spleen: Normal spleen Adrenals/urinary tract: Adrenal glands normal. No nephrolithiasis ureterolithiasis. Bladder normal. Stomach/Bowel: Stomach, small-bowel, cecum and appendix are normal. The ascending and transverse colon are normal. Severe inflammatory process involving the sigmoid colon. Process involves the mid sigmoid colon and distal sigmoid colon with some soft tissue/fluid between the 2 loops of bowel (image 66/2). No IV or oral contrast. Favor severe diverticulosis however cannot exclude underlying mass lesion. No significant intraperitoneal free air. Small volume free fluid the pelvis. Vascular/Lymphatic: Abdominal aorta is normal caliber. No periportal or retroperitoneal adenopathy. No pelvic adenopathy. Reproductive: Uterus and ovaries normal. Other: Small free fluid the pelvis.  Musculoskeletal: No aggressive osseous lesion. IMPRESSION: Extensive inflammatory process within the pelvis associated with 2 loops of sigmoid colon. Favor acute diverticulitis with contained rupture. Cannot exclude underlying neoplasm. Recommend follow-up CT with oral and IV contrast following treatment for diverticulitis or follow-up colonoscopy if not current for screening. Electronically Signed   By: Suzy Bouchard M.D.   On: 09/26/2017 22:06    Pending Labs [employer] (From admission, onward)   Start     Ordered   Signed and Held  CBC  Tomorrow morning,   R  Signed and Held   Signed and Held  Comprehensive metabolic panel  Tomorrow morning,   R     Signed and Held   Signed and Held  Stool culture (children & immunocomp patients)  Once,   R     Signed and Held      Vitals/Pain Today's Vitals   09/26/17 2016 09/26/17 2208 09/26/17 2241 09/27/17 0019  BP: (!) 156/96 (!) 175/95  (!) 154/75  Pulse: 89 95  88  Resp: (!) '23 18  11  ' Temp: 98.2 F (36.8 C)     TempSrc: Oral     SpO2: 97% 95%  95%  Weight:      Height:      PainSc:   8      Isolation Precautions No active isolations  Medications Medications  iopamidol (ISOVUE-300) 61 % injection (has no administration in time range)  piperacillin-tazobactam (ZOSYN) IVPB 3.375 g (has no administration in time range)  sodium chloride 0.9 % bolus 1,000 mL (0 mLs Intravenous Stopped 09/27/17 0100)  ondansetron (ZOFRAN) injection 4 mg (4 mg Intravenous Given 09/26/17 2014)  iopamidol (ISOVUE-300) 61 % injection 30 mL (30 mLs Oral Contrast Given 09/26/17 2127)  ondansetron (ZOFRAN) injection 4 mg (4 mg Intravenous Given 09/26/17 2239)  piperacillin-tazobactam (ZOSYN) IVPB 3.375 g (0 g Intravenous Stopped 09/26/17 2330)    Mobility walks

## 2017-09-27 NOTE — Progress Notes (Signed)
Pharmacy Antibiotic Note  Karen Casey is a 62 y.o. female admitted on 09/26/2017 with intra-abdominal infection.  Pharmacy has been consulted for zosyn dosing.  Plan: Zosyn 3.375g IV q8h (4 hour infusion).  F/u scr/cultures  Height: 5\' 5"  (165.1 cm) Weight: 178 lb (80.7 kg) IBW/kg (Calculated) : 57  Temp (24hrs), Avg:98.6 F (37 C), Min:98.2 F (36.8 C), Max:98.9 F (37.2 C)  Recent Labs  Lab 09/26/17 1325  WBC 16.5*  CREATININE 0.67    Estimated Creatinine Clearance: 79.5 mL/min (by C-G formula based on SCr of 0.67 mg/dL).    No Known Allergies  Antimicrobials this admission: 4/15 zosyn >>    >>   Dose adjustments this admission:   Microbiology results:  BCx:   UCx:    Sputum:    MRSA PCR:   Thank you for allowing pharmacy to be a part of this patient's care.  Karen Casey 09/27/2017 12:40 AM

## 2017-09-27 NOTE — Progress Notes (Addendum)
CC:  Abdominal pain  Subjective: Marked improvement in sx since yesterday.  She has chronic acid reflux since age 62 per pt, but her new pain mostly left lower quadrant is better.  She wants to know when she can eat.  Yesterday she didn't think she would make it.    Objective: Vital signs in last 24 hours: Temp:  [97.8 F (36.6 C)-98.9 F (37.2 C)] 97.8 F (36.6 C) (04/16 0224) Pulse Rate:  [74-114] 74 (04/16 0800) Resp:  [11-23] 13 (04/16 0224) BP: (122-184)/(75-117) 122/79 (04/16 0224) SpO2:  [95 %-98 %] 97 % (04/16 0224) Weight:  [80.7 kg (178 lb)-81.2 kg (179 lb)] 81.2 kg (179 lb) (04/16 0500) Last BM Date: 09/27/17 1338 IV Afebrile VSS; BP improving WBC down to 11K; K+ 3.1 CT scan 4/15:    Extensive inflammatory process within the pelvis associated with 2 loops of sigmoid colon. Favor acute diverticulitis with contained rupture. Cannot exclude underlying neoplasm.    Intake/Output from previous day: 04/15 0701 - 04/16 0700 In: 1338.3 [I.V.:288.3; IV Piggyback:1050] Out: -  Intake/Output this shift: No intake/output data recorded.  General appearance: alert, cooperative and no distress Resp: clear to auscultation bilaterally GI: soft, still tender some in the LLQ, but she had to work to get the pain on self palpation.    Lab Results:  Recent Labs    09/26/17 1325 09/27/17 0441  WBC 16.5* 11.0*  HGB 15.4* 13.3  HCT 45.6 40.6  PLT 206 209    BMET Recent Labs    09/26/17 1325 09/27/17 0441  NA 136 138  K 3.5 3.1*  CL 101 101  CO2 21* 23  GLUCOSE 117* 110*  BUN 10 8  CREATININE 0.67 0.62  CALCIUM 9.2 8.8*   PT/INR No results for input(s): LABPROT, INR in the last 72 hours.  Recent Labs  Lab 09/26/17 1325 09/27/17 0441  AST 15 13*  ALT 19 16  ALKPHOS 73 64  BILITOT 1.0 0.9  PROT 7.8 6.5  ALBUMIN 3.8 3.0*     Lipase     Component Value Date/Time   LIPASE 28 09/26/2017 1325   Prior to Admission medications   Medication Sig Start  Date End Date Taking? Authorizing Provider  ALPRAZolam Duanne Moron) 1 MG tablet Take 1 mg by mouth 4 (four) times daily. 08/29/17  Yes [provider]  citalopram (CELEXA) 40 MG tablet Take 40 mg by mouth daily. 09/04/17  Yes [provider]  esomeprazole (NEXIUM) 40 MG capsule TAKE 1 CAPSULE BY MOUTH EVERY MORNING WITH BREAKFAST 08/23/17  Yes Alveda Reasons, MD  fluticasone [facility]) 50 MCG/ACT nasal spray Place 2 sprays into both nostrils daily. 08/23/17  Yes Alveda Reasons, MD  gabapentin (NEURONTIN) 600 MG tablet Take 600 mg by mouth 4 (four) times daily. 09/14/17  Yes [provider]  ranitidine (ZANTAC) 150 MG capsule Take 1 capsule (150 mg total) by mouth 2 (two) times daily. 08/23/17  Yes Alveda Reasons, MD  sucralfate (CARAFATE) 1 g tablet Take 1 tablet (1 g total) by mouth 4 (four) times daily as needed. 08/23/17  Yes Alveda Reasons, MD  tiZANidine (ZANAFLEX) 4 MG tablet Take 4 mg by mouth 2 (two) times daily. 07/27/17  Yes [provider]      Medications: . ALPRAZolam  1 mg Oral QID  . citalopram  40 mg Oral Daily  . fluticasone  2 spray Each Nare Daily  . folic acid  1 mg Oral Daily  .  gabapentin  600 mg Oral QID  . iopamidol      . LORazepam  0-4 mg Intravenous Q6H   Followed by  . [START ON 09/29/2017] LORazepam  0-4 mg Intravenous Q12H  . multivitamin with minerals  1 tablet Oral Daily  . nicotine  21 mg Transdermal Daily  . thiamine  100 mg Oral Daily   Or  . thiamine  100 mg Intravenous Daily  . tiZANidine  4 mg Oral BID   . sodium chloride 100 mL/hr at 09/27/17 0309  . famotidine (PEPCID) IV Stopped (09/27/17 0341)  . piperacillin-tazobactam (ZOSYN)  IV 3.375 g (09/27/17 0711)    Assessment/Plan Hx of ETOH use - CIWA protocol Hx of seizure - xanax withdrawal - 05/2012. Tobacco use Anxiety/Depression GERD - chronic since age 79   Acute diverticulitis with possible perforation vs neoplasm Sepsis  FEN: IV fluids/NPO DVT:  SCD ID:  Zosyn 4/15 =>> day 2 Follow up:  TBD   Plan:  Agree with medical management.  I will give her some sips and chips today.  She can be on DVT prophylaxis.  She will need a CT with contrast to better delineate the CT finding, but I would wait for response to abx.       LOS: 0 days    Karen Casey 09/27/2017 [phone]

## 2017-09-27 NOTE — ED Notes (Signed)
First attempt made to call report. RN says she will call back.

## 2017-09-28 DIAGNOSIS — K5720 Diverticulitis of large intestine with perforation and abscess without bleeding: Secondary | ICD-10-CM

## 2017-09-28 DIAGNOSIS — K5780 Diverticulitis of intestine, part unspecified, with perforation and abscess without bleeding: Secondary | ICD-10-CM

## 2017-09-28 DIAGNOSIS — A419 Sepsis, unspecified organism: Principal | ICD-10-CM

## 2017-09-28 DIAGNOSIS — F1029 Alcohol dependence with unspecified alcohol-induced disorder: Secondary | ICD-10-CM

## 2017-09-28 LAB — C DIFFICILE QUICK SCREEN W PCR REFLEX
C DIFFICILE (CDIFF) INTERP: NOT DETECTED
C Diff antigen: NEGATIVE
C Diff toxin: NEGATIVE

## 2017-09-28 MED ORDER — LOPERAMIDE HCL 2 MG PO CAPS
2.0000 mg | ORAL_CAPSULE | ORAL | Status: DC | PRN
Start: 2017-09-28 — End: 2017-09-29

## 2017-09-28 MED ORDER — ALBUTEROL SULFATE (2.5 MG/3ML) 0.083% IN NEBU: 2.5000 mg | INHALATION_SOLUTION | Freq: Four times a day (QID) | RESPIRATORY_TRACT | Status: DC | PRN

## 2017-09-28 NOTE — Progress Notes (Signed)
CC:   Abdominal pain    Subjective: She feels better and is mostly hungry.  Wants to eat.    Objective: Vital signs in last 24 hours: Temp:  [97.4 F (36.3 C)-98.3 F (36.8 C)] 97.5 F (36.4 C) (04/17 0528) Pulse Rate:  [73-80] 73 (04/17 0528) Resp:  [15-18] 18 (04/17 0528) BP: (117-149)/(59-82) 149/82 (04/17 0528) SpO2:  [96 %-98 %] 97 % (04/17 0528) Last BM Date: 09/27/17 NPO 2233 IV Voided x 5 BM x 4 Afebrile, VSS No labs Repeat CT yesterday below    Intake/Output from previous day: 04/16 0701 - 04/17 0700 In: 2233.3 [I.V.:1933.3; IV Piggyback:300] Out: -  Intake/Output this shift: No intake/output data recorded.  General appearance: alert, cooperative and no distress Resp: clear to auscultation bilaterally GI: soft, non-tender; bowel sounds normal; no masses,  no organomegaly  Lab Results:  Recent Labs    09/26/17 1325 09/27/17 0441  WBC 16.5* 11.0*  HGB 15.4* 13.3  HCT 45.6 40.6  PLT 206 209    BMET Recent Labs    09/26/17 1325 09/27/17 0441  NA 136 138  K 3.5 3.1*  CL 101 101  CO2 21* 23  GLUCOSE 117* 110*  BUN 10 8  CREATININE 0.67 0.62  CALCIUM 9.2 8.8*   PT/INR No results for input(s): LABPROT, INR in the last 72 hours.  Recent Labs  Lab 09/26/17 1325 09/27/17 0441  AST 15 13*  ALT 19 16  ALKPHOS 73 64  BILITOT 1.0 0.9  PROT 7.8 6.5  ALBUMIN 3.8 3.0*     Lipase     Component Value Date/Time   LIPASE 28 09/26/2017 1325     Medications: . ALPRAZolam  1 mg Oral QID  . citalopram  40 mg Oral Daily  . fluticasone  2 spray Each Nare Daily  . folic acid  1 mg Oral Daily  . gabapentin  600 mg Oral QID  . LORazepam  0-4 mg Intravenous Q6H   Followed by  . [START ON 09/29/2017] LORazepam  0-4 mg Intravenous Q12H  . multivitamin with minerals  1 tablet Oral Daily  . nicotine  21 mg Transdermal Daily  . thiamine  100 mg Oral Daily   Or  . thiamine  100 mg Intravenous Daily  . tiZANidine  4 mg Oral BID   . 0.9 %  NaCl with KCl 40 mEq / L 100 mL/hr (09/28/17 3220)  . famotidine (PEPCID) IV Stopped (09/27/17 2142)  . piperacillin-tazobactam (ZOSYN)  IV Stopped (09/28/17 0901)   Assessment/Plan  Hx of ETOH use - CIWA protocol Hx of seizure - xanax withdrawal - 05/2012. Tobacco use Anxiety/Depression GERD - chronic since age 35   Acute diverticulitis with possible perforation vs neoplasm  - repeat CT with contrast : demonstrates inflammatory process within the deep pelvis associated with the    sigmoid colon.  Findings most consistent with perforated acute diverticulitis with contained    perforation/abscess formation. There is no leakage of oral contrast identified. Cannot completely exclude    underlying neoplasm. Sepsis   FEN: IV fluids/NPO DVT: SCD ID:  Zosyn 4/15 =>> day 3 Follow up:  TBD   Plan:  Advance diet, clears now, fulls at supper if she does well.  She wants to go home and back to work.   Recheck labs in AM.  She will need GI evaluation and colonoscopy after this clears.         LOS: 1 day    Canton Yearby 09/28/2017  [phone]  

## 2017-09-28 NOTE — Progress Notes (Signed)
PROGRESS NOTE    Karen Casey  NID:[id number] DOB: [birth year] DOA: 09/26/2017 PCP: Alveda Reasons, MD  Outpatient Specialists:   Brief Narrative:  62 year old female with history of alcohol abuse, anxiety, depression, GERD, seizure (BZD withdrawal). Last colonoscopy was 11/28/12 noting diverticulosis. Presented to ED with 2 day history of lower abdominal pain (distinctly different from her usual upper abdominal pain from GERD). CT showed inflammatory process of 2 loops of sigmoid colon, favoring acute diverticulitis. Patient was afebrile with WBC 16.5. She was started on Zosyn, IVFs, CIWA protocol and admitted on working diagnosis of acute diverticulitis. Surgery was consulted and recommended repeat CT scan. CT showed same inflammatory process within deep pelvis associated with sigmoid colon, most consistent with perforated acute diverticulitis.   Assessment & Plan:  Acute diverticulitis: - Patient is hungry with no nausea, per Surgery NPO discontinued, advanced diet to clear liquids and will advance to full diet at dinner if no acute changes - C diff quick scan was negative - continue IVFs - Continue IV Zosyn  Diarrhea: - C diff quick scan was negative - started immodium  History of Alcohol use - continue CIWA protocol  GERD: - continue home Pepcid, Carafate  Anxiety/Depression: - Continue home meds - Celexa, Xanax  Tobacco Use: - continue Nicotine patch   DVT prophylaxis: SCD's Code Status: FULL Family Communication: None at bedside Disposition Plan: Home when clinically stable   Consultants:   Surgery  Procedures:   None  Antimicrobials:   Zosyn 4/15 >>   Subjective: Patient is calm and alert. Complains of frequent watery stools. Denies nausea, emesis, abdominal pain, hematochezia. Denies fever, chills, chest pain, or shortness of breath. Patient is eager to eat and to go home.   Objective: Vitals:   09/27/17 1231 09/27/17 1856 09/27/17 2242  09/28/17 0528  BP: 132/77 133/71 (!) 117/59 (!) 149/82  Pulse: 80 75 80 73  Resp: 18 15 18 18   Temp: 98.3 F (36.8 C) (!) 97.4 F (36.3 C) 98.1 F (36.7 C) (!) 97.5 F (36.4 C)  TempSrc: Oral Oral Oral Oral  SpO2: 98% 97% 96% 97%  Weight:      Height:        Intake/Output Summary (Last 24 hours) at 09/28/2017 1206 Last data filed at 09/28/2017 0600 Gross per 24 hour  Intake 2183.33 ml  Output -  Net 2183.33 ml   Filed Weights   09/26/17 1314 09/27/17 0500  Weight: 80.7 kg (178 lb) 81.2 kg (179 lb)    Examination:  General exam: Appears calm and comfortable, in no acute distress Respiratory system: Clear to auscultation. Respiratory effort normal. Cardiovascular system: S1 & S2 heard, RRR. No JVD, murmurs, rubs, gallops or clicks. No lower extremity edema.   Gastrointestinal system: Abdomen is nondistended, soft and nontender. No organomegaly or masses felt.  Central nervous system: Alert and oriented. No focal neurological deficits. Extremities: Symmetric 5 x 5 power. Skin: No rashes, lesions or ulcers Psychiatry: Judgement and insight appear normal. Mood & affect appropriate.     Data Reviewed: I have personally reviewed following labs and imaging studies  CBC: Recent Labs  Lab 09/26/17 1325 09/27/17 0441  WBC 16.5* 11.0*  HGB 15.4* 13.3  HCT 45.6 40.6  MCV 95.4 95.8  PLT 206 144   Basic Metabolic Panel: Recent Labs  Lab 09/26/17 1325 09/27/17 0441  NA 136 138  K 3.5 3.1*  CL 101 101  CO2 21* 23  GLUCOSE 117* 110*  BUN 10 8  CREATININE 0.67 0.62  CALCIUM 9.2 8.8*  MG  --  1.8   GFR: Estimated Creatinine Clearance: 79.7 mL/min (by C-G formula based on SCr of 0.62 mg/dL). Liver Function Tests: Recent Labs  Lab 09/26/17 1325 09/27/17 0441  AST 15 13*  ALT 19 16  ALKPHOS 73 64  BILITOT 1.0 0.9  PROT 7.8 6.5  ALBUMIN 3.8 3.0*   Recent Labs  Lab 09/26/17 1325  LIPASE 28   No results for input(s): AMMONIA in the last 168  hours. Coagulation Profile: No results for input(s): INR, PROTIME in the last 168 hours. Cardiac Enzymes: No results for input(s): CKTOTAL, CKMB, CKMBINDEX, TROPONINI in the last 168 hours. BNP (last 3 results) No results for input(s): PROBNP in the last 8760 hours. HbA1C: No results for input(s): HGBA1C in the last 72 hours. CBG: No results for input(s): GLUCAP in the last 168 hours. Lipid Profile: No results for input(s): CHOL, HDL, LDLCALC, TRIG, CHOLHDL, LDLDIRECT in the last 72 hours. Thyroid Function Tests: No results for input(s): TSH, T4TOTAL, FREET4, T3FREE, THYROIDAB in the last 72 hours. Anemia Panel: No results for input(s): VITAMINB12, FOLATE, FERRITIN, TIBC, IRON, RETICCTPCT in the last 72 hours. Urine analysis:    Component Value Date/Time   COLORURINE AMBER (A) 09/26/2017 2124   APPEARANCEUR HAZY (A) 09/26/2017 2124   LABSPEC 1.024 09/26/2017 2124   PHURINE 5.0 09/26/2017 2124   GLUCOSEU NEGATIVE 09/26/2017 2124   HGBUR SMALL (A) 09/26/2017 2124   BILIRUBINUR NEGATIVE 09/26/2017 2124   BILIRUBINUR negative 08/23/2017 0936   BILIRUBINUR NEG 12/22/2015 1410   KETONESUR 20 (A) 09/26/2017 2124   PROTEINUR 100 (A) 09/26/2017 2124   UROBILINOGEN 0.2 08/23/2017 0936   UROBILINOGEN 1.0 03/09/2013 1013   NITRITE NEGATIVE 09/26/2017 2124   LEUKOCYTESUR NEGATIVE 09/26/2017 2124   Sepsis Labs: @LABRCNTIP (procalcitonin:4,lacticidven:4)  )No results found for this or any previous visit (from the past 240 hour(s)).       Radiology Studies: Ct Abdomen Pelvis Wo Contrast  Result Date: 09/26/2017 CLINICAL DATA:  Vomiting diarrhea.  Abdominal pain for 2 days EXAM: CT ABDOMEN AND PELVIS WITHOUT CONTRAST TECHNIQUE: Multidetector CT imaging of the abdomen and pelvis was performed following the standard protocol without IV contrast. COMPARISON:  03/09/2013 FINDINGS: Lower chest: Lung bases are clear. Hepatobiliary: No focal hepatic lesion. No biliary duct dilatation.  Gallbladder is normal. Common bile duct is normal. Pancreas: Pancreas is normal. No ductal dilatation. No pancreatic inflammation. Spleen: Normal spleen Adrenals/urinary tract: Adrenal glands normal. No nephrolithiasis ureterolithiasis. Bladder normal. Stomach/Bowel: Stomach, small-bowel, cecum and appendix are normal. The ascending and transverse colon are normal. Severe inflammatory process involving the sigmoid colon. Process involves the mid sigmoid colon and distal sigmoid colon with some soft tissue/fluid between the 2 loops of bowel (image 66/2). No IV or oral contrast. Favor severe diverticulosis however cannot exclude underlying mass lesion. No significant intraperitoneal free air. Small volume free fluid the pelvis. Vascular/Lymphatic: Abdominal aorta is normal caliber. No periportal or retroperitoneal adenopathy. No pelvic adenopathy. Reproductive: Uterus and ovaries normal. Other: Small free fluid the pelvis. Musculoskeletal: No aggressive osseous lesion. IMPRESSION: Extensive inflammatory process within the pelvis associated with 2 loops of sigmoid colon. Favor acute diverticulitis with contained rupture. Cannot exclude underlying neoplasm. Recommend follow-up CT with oral and IV contrast following treatment for diverticulitis or follow-up colonoscopy if not current for screening. Electronically Signed   By: Suzy Bouchard M.D.   On: 09/26/2017 22:06   Ct Abdomen Pelvis W Contrast  Result Date: 09/27/2017 CLINICAL  DATA:  Acute diverticulitis, sepsis, elevated white blood cell count EXAM: CT ABDOMEN AND PELVIS WITH CONTRAST TECHNIQUE: Multidetector CT imaging of the abdomen and pelvis was performed using the standard protocol following bolus administration of intravenous contrast. CONTRAST:  125mL OMNIPAQUE IOHEXOL 300 MG/ML  SOLN COMPARISON:  Noncontrast CT 09/26/2017, CT 03/09/2013 FINDINGS: Lower chest: Lung bases are clear. Hepatobiliary: No focal hepatic lesion. No biliary duct dilatation.  Gallbladder is normal. Common bile duct is normal. Pancreas: Pancreas is normal. No ductal dilatation. No pancreatic inflammation. Spleen: Normal spleen Adrenals/urinary tract: Adrenal glands and kidneys are normal. The ureters and bladder normal. Stomach/Bowel: Stomach, small-bowel cecum and appendix are normal. Ascending, transverse and descending colon normal. Contrast passes the entirety of the colon to the rectum. Again demonstrated severe inflammatory process position between 2 loops of sigmoid colon. There is a contained perforation with small amount of gas measuring 2.9 by 2.3 by 3.9 cm (image 67/2). This is felt to represent contained perforation/abscess. The perforation is likely within the distal sigmoid colon at the recto sigmoid junction (image 63/2). Secondary inflammation of the more proximal sigmoid colon (image 65/2). There is fullness of the anterior wall of the rectum (image 72/2) which is likely inflammatory. Vascular/Lymphatic: Abdominal aorta is normal caliber. No periportal or retroperitoneal adenopathy. No pelvic adenopathy. Reproductive: Uterus and ovaries normal. Other: Small free fluid.  No intraperitoneal free air Musculoskeletal: No aggressive osseous lesion. IMPRESSION: Follow-up IV and oral contrast CT again demonstrates inflammatory process within the deep pelvis associated with the sigmoid colon. Findings most consistent with perforated acute diverticulitis with contained perforation/abscess formation. There is no leakage of oral contrast identified. Cannot completely exclude underlying neoplasm. Recommend follow-up imaging or colonoscopy after treatment for acute diverticulitis. Electronically Signed   By: Suzy Bouchard M.D.   On: 09/27/2017 15:14        Scheduled Meds: . ALPRAZolam  1 mg Oral QID  . citalopram  40 mg Oral Daily  . fluticasone  2 spray Each Nare Daily  . folic acid  1 mg Oral Daily  . gabapentin  600 mg Oral QID  . LORazepam  0-4 mg Intravenous Q6H    Followed by  . [START ON 09/29/2017] LORazepam  0-4 mg Intravenous Q12H  . multivitamin with minerals  1 tablet Oral Daily  . nicotine  21 mg Transdermal Daily  . thiamine  100 mg Oral Daily   Or  . thiamine  100 mg Intravenous Daily  . tiZANidine  4 mg Oral BID   Continuous Infusions: . 0.9 % NaCl with KCl 40 mEq / L 100 mL/hr (09/28/17 4332)  . famotidine (PEPCID) IV Stopped (09/28/17 1019)  . piperacillin-tazobactam (ZOSYN)  IV Stopped (09/28/17 0901)     LOS: 1 day    Time spent: 44min   Merlene Pulling, PA-S [facility] If 7PM-7AM, please contact night-coverage [email or web address] Password [facility] 09/28/2017, 12:06 PM

## 2017-09-28 NOTE — Progress Notes (Signed)
   09/28/17 1423  Clinical Encounter Type  Visited With Patient  Visit Type Initial  Referral From Nurse  Consult/Referral To Chaplain  Spiritual Encounters  Spiritual Needs Literature;Prayer;Emotional   Responding to a [facility] for information on HCPA.  Patient had the information and I went over it with her.  She expressed how her mother was on a ventilator and she does not want that for herself.  Shared a lot about her parents and their passing 25 years ago.  Patient was not ready to complete the HCPA today, I let her know if she wants to we can make it happen while she is here.  Patient was emotional thinking about her past with her parents and being in the hospital now.  Will follow and support as needed. Chaplain Katherene Ponto

## 2017-09-29 DIAGNOSIS — F419 Anxiety disorder, unspecified: Secondary | ICD-10-CM

## 2017-09-29 LAB — CBC
HCT: 39.2 % (ref 36.0–46.0)
HEMOGLOBIN: 12.9 g/dL (ref 12.0–15.0)
MCH: 31.3 pg (ref 26.0–34.0)
MCHC: 32.9 g/dL (ref 30.0–36.0)
MCV: 95.1 fL (ref 78.0–100.0)
Platelets: 232 10*3/uL (ref 150–400)
RBC: 4.12 MIL/uL (ref 3.87–5.11)
RDW: 11.9 % (ref 11.5–15.5)
WBC: 4.6 10*3/uL (ref 4.0–10.5)

## 2017-09-29 LAB — BASIC METABOLIC PANEL
Anion gap: 9 (ref 5–15)
BUN: 5 mg/dL — AB (ref 6–20)
CHLORIDE: 107 mmol/L (ref 101–111)
CO2: 24 mmol/L (ref 22–32)
CREATININE: 0.68 mg/dL (ref 0.44–1.00)
Calcium: 8.7 mg/dL — ABNORMAL LOW (ref 8.9–10.3)
GFR calc Af Amer: >60 mL/min (ref >60)
GFR calc non Af Amer: >60 mL/min (ref >60)
GLUCOSE: 126 mg/dL — AB (ref 65–99)
POTASSIUM: 4.2 mmol/L (ref 3.5–5.1)
Sodium: 140 mmol/L (ref 135–145)

## 2017-09-29 LAB — MAGNESIUM: Magnesium: 1.7 mg/dL (ref 1.7–2.4)

## 2017-09-29 MED ORDER — AMOXICILLIN-POT CLAVULANATE 875-125 MG PO TABS: 1.0000 | ORAL_TABLET | Freq: Two times a day (BID) | ORAL | 0 refills | Status: AC

## 2017-09-29 MED ORDER — FAMOTIDINE 20 MG PO TABS
20.0000 mg | ORAL_TABLET | Freq: Two times a day (BID) | ORAL | Status: DC
  Administered 2017-09-29: 20 mg via ORAL
  Filled 2017-09-29: qty 1

## 2017-09-29 NOTE — Plan of Care (Signed)
Discharge instructions reviewed with patient, questions answered, verbalized understanding.  Patient verbalizes understanding that she is to pick Augmentin up from her pharmacy, take 1 twice a day for 10 days.  RN went to security and retrieved patients belongings, cigarettes, cigarette lighter and box cutter and returned these to patient.  Patient ambulatory from unit accompanied by nurse with all her belongings down to main entrance to be picked up by daughter.

## 2017-09-29 NOTE — Progress Notes (Cosign Needed)
PROGRESS NOTE    Karen Casey  MLY:[id number] DOB: [birth year] DOA: 09/26/2017 PCP: Alveda Reasons, MD  Outpatient Specialists:  Brief Narrative:  62 year old female with history of alcohol abuse, anxiety, depression, GERD, seizure (BZD withdrawal). Last colonoscopy was 11/28/12 noting diverticulosis. Presented to ED with 2 day history of lower abdominal pain. CT showed inflammatory process of 2 loops of sigmoid colon favoring acute diverticulitis. Patient was afebrile with WBC 16.5. She was started on Zosyn, IVFs, CIWA protocol and admitted on working diagnosis of acute diverticulitis. Surgery was consulted and recommended repeat CT scan with contrast, this showed same inflammatory process within deep pelvis associated with sigmoid colon, most consistent with perforated acute diverticulitis. Patient has responded well to antibiotics and has progressed to soft diet.  Assessment & Plan:   Sepsis due to Acute diverticulitis: - sepsis has resolved - being treated with empiric Zosyn - will follow general surgery recommendations, placed patient on soft diet, will discharge today if tolerating well - start patient on 11 days Augmentin at discharge  - recommended to patient that she have GI evaluation and colonoscopy once she leaves the hospital  Diarrhea: - C. Diff negative, likely related to acute diverticulitis and antibiotic use - continue imodium as needed  History of Alcohol use - continue CIWA protocol until discharge  GERD: - continue home Pepcid, Carafate  Anxiety/Depression: - Continue home meds - Celexa, Xanax  Tobacco Use: - continue Nicotine patch until discharge  DVT prophylaxis: None, patient active and walking halls Code Status: Full Family Communication: None at bedside Disposition Plan: Home when able to tolerate soft diet without issue.   Consultants:   Surgery  Procedures:   None  Antimicrobials:   Zosyn 4/15 >>  Subjective: Patient is  alert and oriented, she is walking around the room. States that diarrhea has improved and that she has only had small stools of diarrhea since starting the Imodium. Denies nausea, vomiting, hematochezia, and abdominal pain. No acute events overnight, remains afebrile.   Objective: Vitals:   09/29/17 0023 09/29/17 0506 09/29/17 0644 09/29/17 1021  BP: 130/84 (!) 152/95 (!) 161/87 (!) 153/98  Pulse: 75 72  86  Resp: 16 16  16   Temp: 97.7 F (36.5 C) 97.7 F (36.5 C)  97.9 F (36.6 C)  TempSrc: Oral Oral  Oral  SpO2: 96% 97%  99%  Weight:      Height:        Intake/Output Summary (Last 24 hours) at 09/29/2017 1113 Last data filed at 09/29/2017 0600 Gross per 24 hour  Intake 2770 ml  Output -  Net 2770 ml   Filed Weights   09/26/17 1314 09/27/17 0500  Weight: 80.7 kg (178 lb) 81.2 kg (179 lb)    Examination:  General exam: Appears calm and comfortable, in no acute distress Respiratory system: Clear to auscultation. Respiratory effort normal. Cardiovascular system: S1 & S2 heard, RRR. No lower extremity edema. Gastrointestinal system: Abdomen is nondistended, soft and nontender. Normal bowel sounds heard. Central nervous system: Alert and oriented. No focal neurological deficits. Extremities: Symmetric 5 x 5 power. Skin: No rashes, lesions or ulcers Psychiatry: Judgement and insight appear normal. Mood & affect appropriate.   Data Reviewed: I have personally reviewed following labs and imaging studies  CBC: Recent Labs  Lab 09/26/17 1325 09/27/17 0441 09/29/17 0408  WBC 16.5* 11.0* 4.6  HGB 15.4* 13.3 12.9  HCT 45.6 40.6 39.2  MCV 95.4 95.8 95.1  PLT 206 209 232  Basic Metabolic Panel: Recent Labs  Lab 09/26/17 1325 09/27/17 0441 09/29/17 0408  NA 136 138 140  K 3.5 3.1* 4.2  CL 101 101 107  CO2 21* 23 24  GLUCOSE 117* 110* 126*  BUN 10 8 5*  CREATININE 0.67 0.62 0.68  CALCIUM 9.2 8.8* 8.7*  MG  --  1.8 1.7   GFR: Estimated Creatinine Clearance: 79.7  mL/min (by C-G formula based on SCr of 0.68 mg/dL). Liver Function Tests: Recent Labs  Lab 09/26/17 1325 09/27/17 0441  AST 15 13*  ALT 19 16  ALKPHOS 73 64  BILITOT 1.0 0.9  PROT 7.8 6.5  ALBUMIN 3.8 3.0*   Recent Labs  Lab 09/26/17 1325  LIPASE 28   No results for input(s): AMMONIA in the last 168 hours. Coagulation Profile: No results for input(s): INR, PROTIME in the last 168 hours. Cardiac Enzymes: No results for input(s): CKTOTAL, CKMB, CKMBINDEX, TROPONINI in the last 168 hours. BNP (last 3 results) No results for input(s): PROBNP in the last 8760 hours. HbA1C: No results for input(s): HGBA1C in the last 72 hours. CBG: No results for input(s): GLUCAP in the last 168 hours. Lipid Profile: No results for input(s): CHOL, HDL, LDLCALC, TRIG, CHOLHDL, LDLDIRECT in the last 72 hours. Thyroid Function Tests: No results for input(s): TSH, T4TOTAL, FREET4, T3FREE, THYROIDAB in the last 72 hours. Anemia Panel: No results for input(s): VITAMINB12, FOLATE, FERRITIN, TIBC, IRON, RETICCTPCT in the last 72 hours. Urine analysis:    Component Value Date/Time   COLORURINE AMBER (A) 09/26/2017 2124   APPEARANCEUR HAZY (A) 09/26/2017 2124   LABSPEC 1.024 09/26/2017 2124   PHURINE 5.0 09/26/2017 2124   GLUCOSEU NEGATIVE 09/26/2017 2124   HGBUR SMALL (A) 09/26/2017 2124   BILIRUBINUR NEGATIVE 09/26/2017 2124   BILIRUBINUR negative 08/23/2017 0936   BILIRUBINUR NEG 12/22/2015 1410   KETONESUR 20 (A) 09/26/2017 2124   PROTEINUR 100 (A) 09/26/2017 2124   UROBILINOGEN 0.2 08/23/2017 0936   UROBILINOGEN 1.0 03/09/2013 1013   NITRITE NEGATIVE 09/26/2017 2124   LEUKOCYTESUR NEGATIVE 09/26/2017 2124   Sepsis Labs: @LABRCNTIP (procalcitonin:4,lacticidven:4)  ) Recent Results (from the past 240 hour(s))  Stool culture (children & immunocomp patients)     Status: None (Preliminary result)   Collection Time: 09/27/17  4:46 AM  Result Value Ref Range Status   Salmonella/Shigella  Screen PENDING  Incomplete   Campylobacter Culture PENDING  Incomplete   E coli, Shiga toxin Assay Negative Negative Final    Comment: (NOTE) Performed At: [facility] [address] [place], [place] [id number] Rush Farmer MD IH:[id number] Performed at [facility], [place] [address], [place], [place] [address]   C difficile quick scan w PCR reflex     Status: None   Collection Time: 09/28/17 11:33 AM  Result Value Ref Range Status   C Diff antigen NEGATIVE NEGATIVE Final   C Diff toxin NEGATIVE NEGATIVE Final   C Diff interpretation No C. difficile detected.  Final    Comment: Performed at [facility], [place] [address], [place], [place] [address]         Radiology Studies: Ct Abdomen Pelvis W Contrast  Result Date: 09/27/2017 CLINICAL DATA:  Acute diverticulitis, sepsis, elevated white blood cell count EXAM: CT ABDOMEN AND PELVIS WITH CONTRAST TECHNIQUE: Multidetector CT imaging of the abdomen and pelvis was performed using the standard protocol following bolus administration of intravenous contrast. CONTRAST:  132mL OMNIPAQUE IOHEXOL 300 MG/ML  SOLN COMPARISON:  Noncontrast CT 09/26/2017, CT  03/09/2013 FINDINGS: Lower chest: Lung bases are clear. Hepatobiliary: No focal hepatic lesion. No biliary duct dilatation. Gallbladder is normal. Common bile duct is normal. Pancreas: Pancreas is normal. No ductal dilatation. No pancreatic inflammation. Spleen: Normal spleen Adrenals/urinary tract: Adrenal glands and kidneys are normal. The ureters and bladder normal. Stomach/Bowel: Stomach, small-bowel cecum and appendix are normal. Ascending, transverse and descending colon normal. Contrast passes the entirety of the colon to the rectum. Again demonstrated severe inflammatory process position between 2 loops of sigmoid colon. There is a contained perforation with small amount of gas measuring 2.9 by 2.3 by 3.9 cm (image 67/2). This is felt to represent  contained perforation/abscess. The perforation is likely within the distal sigmoid colon at the recto sigmoid junction (image 63/2). Secondary inflammation of the more proximal sigmoid colon (image 65/2). There is fullness of the anterior wall of the rectum (image 72/2) which is likely inflammatory. Vascular/Lymphatic: Abdominal aorta is normal caliber. No periportal or retroperitoneal adenopathy. No pelvic adenopathy. Reproductive: Uterus and ovaries normal. Other: Small free fluid.  No intraperitoneal free air Musculoskeletal: No aggressive osseous lesion. IMPRESSION: Follow-up IV and oral contrast CT again demonstrates inflammatory process within the deep pelvis associated with the sigmoid colon. Findings most consistent with perforated acute diverticulitis with contained perforation/abscess formation. There is no leakage of oral contrast identified. Cannot completely exclude underlying neoplasm. Recommend follow-up imaging or colonoscopy after treatment for acute diverticulitis. Electronically Signed   By: Suzy Bouchard M.D.   On: 09/27/2017 15:14        Scheduled Meds: . ALPRAZolam  1 mg Oral QID  . citalopram  40 mg Oral Daily  . famotidine  20 mg Oral BID  . fluticasone  2 spray Each Nare Daily  . folic acid  1 mg Oral Daily  . gabapentin  600 mg Oral QID  . LORazepam  0-4 mg Intravenous Q12H  . multivitamin with minerals  1 tablet Oral Daily  . nicotine  21 mg Transdermal Daily  . thiamine  100 mg Oral Daily  . tiZANidine  4 mg Oral BID   Continuous Infusions: . 0.9 % NaCl with KCl 40 mEq / L 100 mL/hr (09/29/17 0538)  . piperacillin-tazobactam (ZOSYN)  IV Stopped (09/29/17 0939)     LOS: 2 days    Time spent: 25 mins  Merlene Pulling, PA-S [facility] If 7PM-7AM, please contact night-coverage [email or web address] Password [email or web address] 09/29/2017, 11:13 AM

## 2017-09-29 NOTE — Discharge Summary (Signed)
Physician Discharge Summary  Karen Casey  ZSW:[id number]  DOB: [birth year]  DOA: 09/26/2017 PCP: Alveda Reasons, MD  Admit date: 09/26/2017 Discharge date: 09/29/2017  Admitted From: Home  Disposition: Home   Recommendations for Outpatient Follow-up:  1. Follow up with PCP in 1 week  2. Follow up with GI - in 2 weeks for colonoscopy  3. Please obtain BMP/CBC in one week to monitor hemoglobin and renal function  Discharge Condition: Stable CODE STATUS: Full code Diet recommendation: Heart Healthy   Brief/Interim Summary: For full details see H&P/Progress note, but in brief, Karen Casey is a a 62 year old female with history of alcohol abuse, anxiety, depression graders presented to the emergency department with 2 days history of lower abdominal pain.  Upon ED evaluation CT showed inflammatory process of 2 loops of sigmoid colon favoring acute diverticulitis.  Lab workup shows WBC 16.5 and patient was admitted with working diagnosis of acute diverticulitis.  Patient was started on empiric antibiotic and IV fluids.  Surgery was consulted recommending repeating CT scan with contrast,  this showed inflammatory process within the sigmoid colon consistent with perforated acute diverticulitis but no leakage of oral contrast was identified.  No surgical indication was indicated and patient was treated to complete conservative lip with antibiotics.  Patient diet was advanced and she tolerated well.  Patient deemed stable for discharge to follow-up with PCP as an outpatient.  Subjective: Patient seen and examined she is up and about.  Denies abdominal pain, tolerating diet well.  Remains afebrile.  Diarrhea improved.  Ambulating without difficulties.  Discharge Diagnoses/Hospital Course:  Sepsis due to acute diverticulitis Sepsis physiology has resolved Patient initially treated with Zosyn, subsequently switched to Augmentin will complete antibiotic therapy for 14 days.  General surgery  was consulted and recommended conservative management with abx, no need for surgical intervention.  Patient will need outpatient colonoscopy after completing antibiotic therapy.  Follow-up with PCP in 1 week  Hypokalemia - resolved Potassium was repleted with IV fluids Check BMP in 1 week  GERD Continue Nexium and Zantac  Diarrhea - resolved C. difficile negative, felt to be related to acute diverticulitis  Anxiety and depression Continue home medications with no changes  Alcohol abuse No signs of alcohol withdrawal  All other chronic medical condition were stable during the hospitalization.  On the day of the discharge the patient's vitals were stable, and no other acute medical condition were reported by patient. the patient was felt safe to be discharge to home  Discharge Instructions  You were cared for by a hospitalist during your hospital stay. If you have any questions about your discharge medications or the care you received while you were in the hospital after you are discharged, you can call the unit and asked to speak with the hospitalist on call if the hospitalist that took care of you is not available. Once you are discharged, your primary care physician will handle any further medical issues. Please note that NO REFILLS for any discharge medications will be authorized once you are discharged, as it is imperative that you return to your primary care physician (or establish a relationship with a primary care physician if you do not have one) for your aftercare needs so that they can reassess your need for medications and monitor your lab values.  Discharge Instructions    Call MD for:  difficulty breathing, headache or visual disturbances   Complete by:  As directed    Call MD for:  extreme  fatigue   Complete by:  As directed    Call MD for:  hives   Complete by:  As directed    Call MD for:  persistant dizziness or light-headedness   Complete by:  As directed    Call MD  for:  persistant nausea and vomiting   Complete by:  As directed    Call MD for:  redness, tenderness, or signs of infection (pain, swelling, redness, odor or green/yellow discharge around incision site)   Complete by:  As directed    Call MD for:  severe uncontrolled pain   Complete by:  As directed    Call MD for:  temperature >100.4   Complete by:  As directed    Diet - low sodium heart healthy   Complete by:  As directed    Increase activity slowly   Complete by:  As directed      Allergies as of 09/29/2017   No Known Allergies     Medication List    TAKE these medications   ALPRAZolam 1 MG tablet Commonly known as:  XANAX Take 1 mg by mouth 4 (four) times daily.   citalopram 40 MG tablet Commonly known as:  CELEXA Take 40 mg by mouth daily.   esomeprazole 40 MG capsule Commonly known as:  NEXIUM TAKE 1 CAPSULE BY MOUTH EVERY MORNING WITH BREAKFAST   fluticasone 50 MCG/ACT nasal spray Commonly known as:  FLONASE Place 2 sprays into both nostrils daily.   gabapentin 600 MG tablet Commonly known as:  NEURONTIN Take 600 mg by mouth 4 (four) times daily.   ranitidine 150 MG capsule Commonly known as:  ZANTAC Take 1 capsule (150 mg total) by mouth 2 (two) times daily.   sucralfate 1 g tablet Commonly known as:  CARAFATE Take 1 tablet (1 g total) by mouth 4 (four) times daily as needed.   tiZANidine 4 MG tablet Commonly known as:  ZANAFLEX Take 4 mg by mouth 2 (two) times daily.      Follow-up [facility], [place]. Call.   Specialty:  General Surgery Why:  as needed with questions or concerns Contact information: [address] [place] [phone]       Alveda Reasons, MD. Schedule an appointment as soon as possible for a visit in 1 week(s).   Specialty:  Family Medicine Why:  Hospital follow-up Contact information: [place]  [address] [phone]          No Known Allergies  Consultations:  General surgery    Procedures/Studies: Ct Abdomen Pelvis Wo Contrast  Result Date: 09/26/2017 CLINICAL DATA:  Vomiting diarrhea.  Abdominal pain for 2 days EXAM: CT ABDOMEN AND PELVIS WITHOUT CONTRAST TECHNIQUE: Multidetector CT imaging of the abdomen and pelvis was performed following the standard protocol without IV contrast. COMPARISON:  03/09/2013 FINDINGS: Lower chest: Lung bases are clear. Hepatobiliary: No focal hepatic lesion. No biliary duct dilatation. Gallbladder is normal. Common bile duct is normal. Pancreas: Pancreas is normal. No ductal dilatation. No pancreatic inflammation. Spleen: Normal spleen Adrenals/urinary tract: Adrenal glands normal. No nephrolithiasis ureterolithiasis. Bladder normal. Stomach/Bowel: Stomach, small-bowel, cecum and appendix are normal. The ascending and transverse colon are normal. Severe inflammatory process involving the sigmoid colon. Process involves the mid sigmoid colon and distal sigmoid colon with some soft tissue/fluid between the 2 loops of bowel (image 66/2). No IV or oral contrast. Favor severe diverticulosis however cannot exclude underlying  mass lesion. No significant intraperitoneal free air. Small volume free fluid the pelvis. Vascular/Lymphatic: Abdominal aorta is normal caliber. No periportal or retroperitoneal adenopathy. No pelvic adenopathy. Reproductive: Uterus and ovaries normal. Other: Small free fluid the pelvis. Musculoskeletal: No aggressive osseous lesion. IMPRESSION: Extensive inflammatory process within the pelvis associated with 2 loops of sigmoid colon. Favor acute diverticulitis with contained rupture. Cannot exclude underlying neoplasm. Recommend follow-up CT with oral and IV contrast following treatment for diverticulitis or follow-up colonoscopy if not current for screening. Electronically Signed   By: Suzy Bouchard M.D.   On: 09/26/2017 22:06   Ct  Abdomen Pelvis W Contrast  Result Date: 09/27/2017 CLINICAL DATA:  Acute diverticulitis, sepsis, elevated white blood cell count EXAM: CT ABDOMEN AND PELVIS WITH CONTRAST TECHNIQUE: Multidetector CT imaging of the abdomen and pelvis was performed using the standard protocol following bolus administration of intravenous contrast. CONTRAST:  176mL OMNIPAQUE IOHEXOL 300 MG/ML  SOLN COMPARISON:  Noncontrast CT 09/26/2017, CT 03/09/2013 FINDINGS: Lower chest: Lung bases are clear. Hepatobiliary: No focal hepatic lesion. No biliary duct dilatation. Gallbladder is normal. Common bile duct is normal. Pancreas: Pancreas is normal. No ductal dilatation. No pancreatic inflammation. Spleen: Normal spleen Adrenals/urinary tract: Adrenal glands and kidneys are normal. The ureters and bladder normal. Stomach/Bowel: Stomach, small-bowel cecum and appendix are normal. Ascending, transverse and descending colon normal. Contrast passes the entirety of the colon to the rectum. Again demonstrated severe inflammatory process position between 2 loops of sigmoid colon. There is a contained perforation with small amount of gas measuring 2.9 by 2.3 by 3.9 cm (image 67/2). This is felt to represent contained perforation/abscess. The perforation is likely within the distal sigmoid colon at the recto sigmoid junction (image 63/2). Secondary inflammation of the more proximal sigmoid colon (image 65/2). There is fullness of the anterior wall of the rectum (image 72/2) which is likely inflammatory. Vascular/Lymphatic: Abdominal aorta is normal caliber. No periportal or retroperitoneal adenopathy. No pelvic adenopathy. Reproductive: Uterus and ovaries normal. Other: Small free fluid.  No intraperitoneal free air Musculoskeletal: No aggressive osseous lesion. IMPRESSION: Follow-up IV and oral contrast CT again demonstrates inflammatory process within the deep pelvis associated with the sigmoid colon. Findings most consistent with perforated acute  diverticulitis with contained perforation/abscess formation. There is no leakage of oral contrast identified. Cannot completely exclude underlying neoplasm. Recommend follow-up imaging or colonoscopy after treatment for acute diverticulitis. Electronically Signed   By: Suzy Bouchard M.D.   On: 09/27/2017 15:14    Discharge Exam: Vitals:   09/29/17 0644 09/29/17 1021  BP: (!) 161/87 (!) 153/98  Pulse:  86  Resp:  16  Temp:  97.9 F (36.6 C)  SpO2:  99%   Vitals:   09/29/17 0023 09/29/17 0506 09/29/17 0644 09/29/17 1021  BP: 130/84 (!) 152/95 (!) 161/87 (!) 153/98  Pulse: 75 72  86  Resp: 16 16  16   Temp: 97.7 F (36.5 C) 97.7 F (36.5 C)  97.9 F (36.6 C)  TempSrc: Oral Oral  Oral  SpO2: 96% 97%  99%  Weight:      Height:        General: Pt is alert, awake, not in acute distress Cardiovascular: RRR, S1/S2 +, no rubs, no gallops Respiratory: CTA bilaterally, no wheezing, no rhonchi Abdominal: Soft, NT, ND, bowel sounds + Extremities: no edema, no cyanosis  The results of significant diagnostics from this hospitalization (including imaging, microbiology, ancillary and laboratory) are listed below for reference.     Microbiology: Recent Results (  from the past 240 hour(s))  Stool culture (children & immunocomp patients)     Status: None (Preliminary result)   Collection Time: 09/27/17  4:46 AM  Result Value Ref Range Status   Salmonella/Shigella Screen PENDING  Incomplete   Campylobacter Culture PENDING  Incomplete   E coli, Shiga toxin Assay Negative Negative Final    Comment: (NOTE) Performed At: [facility] [address] [place], [place] [id number] Rush Farmer MD [id number] Performed at [facility], [place] [address], [place], [place] [address]   C difficile quick scan w PCR reflex     Status: None   Collection Time: 09/28/17 11:33 AM  Result Value Ref Range Status   C Diff antigen NEGATIVE NEGATIVE Final   C Diff toxin  NEGATIVE NEGATIVE Final   C Diff interpretation No C. difficile detected.  Final    Comment: Performed at [facility], [place] [address], [place], [place] [address]     Labs: BNP (last 3 results) No results for input(s): BNP in the last 8760 hours. Basic Metabolic Panel: Recent Labs  Lab 09/26/17 1325 09/27/17 0441 09/29/17 0408  NA 136 138 140  K 3.5 3.1* 4.2  CL 101 101 107  CO2 21* 23 24  GLUCOSE 117* 110* 126*  BUN 10 8 5*  CREATININE 0.67 0.62 0.68  CALCIUM 9.2 8.8* 8.7*  MG  --  1.8 1.7   Liver Function Tests: Recent Labs  Lab 09/26/17 1325 09/27/17 0441  AST 15 13*  ALT 19 16  ALKPHOS 73 64  BILITOT 1.0 0.9  PROT 7.8 6.5  ALBUMIN 3.8 3.0*   Recent Labs  Lab 09/26/17 1325  LIPASE 28   No results for input(s): AMMONIA in the last 168 hours. CBC: Recent Labs  Lab 09/26/17 1325 09/27/17 0441 09/29/17 0408  WBC 16.5* 11.0* 4.6  HGB 15.4* 13.3 12.9  HCT 45.6 40.6 39.2  MCV 95.4 95.8 95.1  PLT 206 209 232   Cardiac Enzymes: No results for input(s): CKTOTAL, CKMB, CKMBINDEX, TROPONINI in the last 168 hours. BNP: Invalid input(s): POCBNP CBG: No results for input(s): GLUCAP in the last 168 hours. D-Dimer No results for input(s): DDIMER in the last 72 hours. Hgb A1c No results for input(s): HGBA1C in the last 72 hours. Lipid Profile No results for input(s): CHOL, HDL, LDLCALC, TRIG, CHOLHDL, LDLDIRECT in the last 72 hours. Thyroid function studies No results for input(s): TSH, T4TOTAL, T3FREE, THYROIDAB in the last 72 hours.  Invalid input(s): FREET3 Anemia work up No results for input(s): VITAMINB12, FOLATE, FERRITIN, TIBC, IRON, RETICCTPCT in the last 72 hours. Urinalysis    Component Value Date/Time   COLORURINE AMBER (A) 09/26/2017 2124   APPEARANCEUR HAZY (A) 09/26/2017 2124   LABSPEC 1.024 09/26/2017 2124   PHURINE 5.0 09/26/2017 2124   GLUCOSEU NEGATIVE 09/26/2017 2124   HGBUR SMALL (A) 09/26/2017 2124   BILIRUBINUR  NEGATIVE 09/26/2017 2124   BILIRUBINUR negative 08/23/2017 0936   BILIRUBINUR NEG 12/22/2015 1410   KETONESUR 20 (A) 09/26/2017 2124   PROTEINUR 100 (A) 09/26/2017 2124   UROBILINOGEN 0.2 08/23/2017 0936   UROBILINOGEN 1.0 03/09/2013 1013   NITRITE NEGATIVE 09/26/2017 2124   LEUKOCYTESUR NEGATIVE 09/26/2017 2124   Sepsis Labs Invalid input(s): PROCALCITONIN,  WBC,  LACTICIDVEN Microbiology Recent Results (from the past 240 hour(s))  Stool culture (children & immunocomp patients)     Status: None (Preliminary result)   Collection Time: 09/27/17  4:46 AM  Result Value Ref Range Status  Salmonella/Shigella Screen PENDING  Incomplete   Campylobacter Culture PENDING  Incomplete   E coli, Shiga toxin Assay Negative Negative Final    Comment: (NOTE) Performed At: [facility] [place], [place] [id number] Rush Farmer MD [id number] Performed at [facility], [place] [address], [place], [place] [address]   C difficile quick scan w PCR reflex     Status: None   Collection Time: 09/28/17 11:33 AM  Result Value Ref Range Status   C Diff antigen NEGATIVE NEGATIVE Final   C Diff toxin NEGATIVE NEGATIVE Final   C Diff interpretation No C. difficile detected.  Final    Comment: Performed at [facility], [place] [address], [place], [place] [address]     Time coordinating discharge: 35 minutes  SIGNED:  Chipper Oman, MD  [facility] 09/29/2017, 11:35 AM  Pager please text page via  [email or web address]  Note - This record has been created using [employer]. Chart creation errors have been sought, but may not always have been located. Such creation errors do not reflect on the standard of medical care.

## 2017-09-29 NOTE — Progress Notes (Signed)
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 Tobacco use Anxiety/Depression GERD - chronic since age 62  Acute diverticulitis with possible perforation vs neoplasm  - repeat CT with contrast : demonstrates inflammatory process within the deep pelvis associated with the    sigmoid colon.  Findings most consistent with perforated acute diverticulitis with contained perforation/abscess formation. There is no leakage of oral contrast identified. Cannot completely exclude    underlying neoplasm. Sepsis - resolved  FEN: soft diet DVT: SCD ID: Zosyn 4/15>> Follow up: none needed for CCS  Plan: Okay for discharge from a surgical standpoint with 2 weeks total of antibiotics. Recommend 11 days of Augmentin if discharged today. GI evaluation and colonoscopy after this clears.      LOS: 2 days    Subjective: CC: diverticulitis  Pt states she is not having any pain. She is having diarrhea today. No blood in her stools. No vomiting. No fever or chills. She feels fatigued but otherwise feels much better. She is tolerating her diet.   Objective: Vital signs in last 24 hours: Temp:  [97.5 F (36.4 C)-97.7 F (36.5 C)] 97.7 F (36.5 C) (04/18 0506) Pulse Rate:  [69-75] 72 (04/18 0506) Resp:  [16] 16 (04/18 0506) BP: (130-161)/(84-95) 161/87 (04/18 0644) SpO2:  [96 %-98 %] 97 % (04/18 0506) Last BM Date: 09/28/17  Intake/Output from previous day: 04/17 0701 - 04/18 0700 In: 2770 [P.O.:120; I.V.:2400; IV Piggyback:250] Out: -  Intake/Output this shift: No intake/output data recorded.  PE: Gen:  Alert, NAD, pleasant, cooperative Pulm:  Rate and effort normal Abd: Soft, ND, +BS, no HSM, very mild TTP in suprapubic region. No guarding or signs of peritonitis. Skin: no rashes noted, warm and dry   Anti-infectives: Anti-infectives (From admission, onward)   Start     Dose/Rate  Route Frequency Ordered Stop   09/27/17 0500  piperacillin-tazobactam (ZOSYN) IVPB 3.375 g     3.375 g 12.5 mL/hr over 240 Minutes Intravenous Every 8 hours 09/27/17 0039     09/26/17 2300  piperacillin-tazobactam (ZOSYN) IVPB 3.375 g     3.375 g 100 mL/hr over 30 Minutes Intravenous  Once 09/26/17 2251 09/26/17 2330      Lab Results:  Recent Labs    09/27/17 0441 09/29/17 0408  WBC 11.0* 4.6  HGB 13.3 12.9  HCT 40.6 39.2  PLT 209 232   BMET Recent Labs    09/27/17 0441 09/29/17 0408  NA 138 140  K 3.1* 4.2  CL 101 107  CO2 23 24  GLUCOSE 110* 126*  BUN 8 5*  CREATININE 0.62 0.68  CALCIUM 8.8* 8.7*   PT/INR No results for input(s): LABPROT, INR in the last 72 hours. CMP     Component Value Date/Time   NA 140 09/29/2017 0408   NA 141 08/23/2017 0945   K 4.2 09/29/2017 0408   CL 107 09/29/2017 0408   CO2 24 09/29/2017 0408   GLUCOSE 126 (H) 09/29/2017 0408   BUN 5 (L) 09/29/2017 0408   BUN 12 08/23/2017 0945   CREATININE 0.68 09/29/2017 0408   CREATININE 0.77 01/06/2016 1210   CALCIUM 8.7 (L) 09/29/2017 0408   PROT 6.5 09/27/2017 0441   PROT 6.9 08/23/2017 0945   ALBUMIN 3.0 (L) 09/27/2017 0441   ALBUMIN 4.3 08/23/2017 0945   AST 13 (L) 09/27/2017 0441   ALT 16 09/27/2017 0441   ALKPHOS 64  09/27/2017 0441   BILITOT 0.9 09/27/2017 0441   BILITOT 0.4 08/23/2017 0945   GFRNONAA >60 09/29/2017 0408   GFRAA >60 09/29/2017 0408   Lipase     Component Value Date/Time   LIPASE 28 09/26/2017 1325    Studies/Results: Ct Abdomen Pelvis W Contrast  Result Date: 09/27/2017 CLINICAL DATA:  Acute diverticulitis, sepsis, elevated white blood cell count EXAM: CT ABDOMEN AND PELVIS WITH CONTRAST TECHNIQUE: Multidetector CT imaging of the abdomen and pelvis was performed using the standard protocol following bolus administration of intravenous contrast. CONTRAST:  135mL OMNIPAQUE IOHEXOL 300 MG/ML  SOLN COMPARISON:  Noncontrast CT 09/26/2017, CT 03/09/2013 FINDINGS:  Lower chest: Lung bases are clear. Hepatobiliary: No focal hepatic lesion. No biliary duct dilatation. Gallbladder is normal. Common bile duct is normal. Pancreas: Pancreas is normal. No ductal dilatation. No pancreatic inflammation. Spleen: Normal spleen Adrenals/urinary tract: Adrenal glands and kidneys are normal. The ureters and bladder normal. Stomach/Bowel: Stomach, small-bowel cecum and appendix are normal. Ascending, transverse and descending colon normal. Contrast passes the entirety of the colon to the rectum. Again demonstrated severe inflammatory process position between 2 loops of sigmoid colon. There is a contained perforation with small amount of gas measuring 2.9 by 2.3 by 3.9 cm (image 67/2). This is felt to represent contained perforation/abscess. The perforation is likely within the distal sigmoid colon at the recto sigmoid junction (image 63/2). Secondary inflammation of the more proximal sigmoid colon (image 65/2). There is fullness of the anterior wall of the rectum (image 72/2) which is likely inflammatory. Vascular/Lymphatic: Abdominal aorta is normal caliber. No periportal or retroperitoneal adenopathy. No pelvic adenopathy. Reproductive: Uterus and ovaries normal. Other: Small free fluid.  No intraperitoneal free air Musculoskeletal: No aggressive osseous lesion. IMPRESSION: Follow-up IV and oral contrast CT again demonstrates inflammatory process within the deep pelvis associated with the sigmoid colon. Findings most consistent with perforated acute diverticulitis with contained perforation/abscess formation. There is no leakage of oral contrast identified. Cannot completely exclude underlying neoplasm. Recommend follow-up imaging or colonoscopy after treatment for acute diverticulitis. Electronically Signed   By: Suzy Bouchard M.D.   On: 09/27/2017 15:14      Kalman Drape , [facility] 09/29/2017, 9:11 AM  Pager: [phone] Mon-Wed, Friday  7:00am-4:30pm Thurs 7am-11:30am  Consults: [phone]

## 2017-09-29 NOTE — Progress Notes (Signed)
PHARMACIST - PHYSICIAN COMMUNICATION  CONCERNING: IV to Oral Route Change Policy  RECOMMENDATION: This patient is receiving pepcid by the intravenous route.  Based on criteria approved by the Pharmacy and Therapeutics Committee, the intravenous medication(s) is/are being converted to the equivalent oral dose form(s).   DESCRIPTION: These criteria include:  The patient is eating (either orally or via tube) and/or has been taking other orally administered medications for a least 24 hours  The patient has no evidence of active gastrointestinal bleeding or impaired GI absorption (gastrectomy, short bowel, patient on TNA or NPO).  If you have questions about this conversion, please contact the Pharmacy Department  []   [phone] )  [facility] []   [phone] )  [facility] []   [phone] )  [facility] []   [phone] )  [facility] [x]   [phone] )  [facility]   Leodis Sias T, [facility] 09/29/2017 8:16 AM

## 2017-10-02 LAB — STOOL CULTURE REFLEX - RSASHR

## 2017-10-02 LAB — STOOL CULTURE: E coli, Shiga toxin Assay: NEGATIVE

## 2017-10-02 LAB — STOOL CULTURE REFLEX - CMPCXR

## 2017-10-04 ENCOUNTER — Telehealth: Payer: Self-pay | Admitting: Gastroenterology

## 2017-10-05 ENCOUNTER — Ambulatory Visit: Payer: Self-pay

## 2017-10-07 ENCOUNTER — Inpatient Hospital Stay: Payer: BLUE CROSS/BLUE SHIELD | Admitting: Student in an Organized Health Care Education/Training Program

## 2017-10-10 ENCOUNTER — Encounter: Payer: BLUE CROSS/BLUE SHIELD | Admitting: Podiatry

## 2017-10-14 ENCOUNTER — Emergency Department (HOSPITAL_COMMUNITY): Payer: BLUE CROSS/BLUE SHIELD

## 2017-10-14 ENCOUNTER — Other Ambulatory Visit: Payer: Self-pay

## 2017-10-14 ENCOUNTER — Encounter (HOSPITAL_COMMUNITY): Payer: Self-pay | Admitting: *Deleted

## 2017-10-14 ENCOUNTER — Emergency Department (HOSPITAL_COMMUNITY)
Admission: EM | Admit: 2017-10-14 | Discharge: 2017-10-14 | Disposition: A | Discharge status: Home / Self Care | Payer: BLUE CROSS/BLUE SHIELD | Attending: Physician Assistant | Admitting: Physician Assistant

## 2017-10-14 DIAGNOSIS — F17210 Nicotine dependence, cigarettes, uncomplicated: Secondary | ICD-10-CM | POA: Insufficient documentation

## 2017-10-14 DIAGNOSIS — R112 Nausea with vomiting, unspecified: Secondary | ICD-10-CM | POA: Diagnosis not present

## 2017-10-14 DIAGNOSIS — Z79899 Other long term (current) drug therapy: Secondary | ICD-10-CM | POA: Insufficient documentation

## 2017-10-14 DIAGNOSIS — R1013 Epigastric pain: Secondary | ICD-10-CM | POA: Diagnosis present

## 2017-10-14 LAB — URINALYSIS, ROUTINE W REFLEX MICROSCOPIC
Bilirubin Urine: NEGATIVE
Glucose, UA: NEGATIVE mg/dL
Hgb urine dipstick: NEGATIVE
KETONES UR: NEGATIVE mg/dL
LEUKOCYTES UA: NEGATIVE
Nitrite: NEGATIVE
PH: 6 (ref 5.0–8.0)
Protein, ur: NEGATIVE mg/dL
SPECIFIC GRAVITY, URINE: 1.017 (ref 1.005–1.030)

## 2017-10-14 LAB — RAPID URINE DRUG SCREEN, HOSP PERFORMED
AMPHETAMINES: NOT DETECTED
BARBITURATES: NOT DETECTED
BENZODIAZEPINES: POSITIVE — AB
Cocaine: NOT DETECTED
Opiates: NOT DETECTED
TETRAHYDROCANNABINOL: POSITIVE — AB

## 2017-10-14 LAB — LIPASE, BLOOD: LIPASE: 32 U/L (ref 11–51)

## 2017-10-14 LAB — COMPREHENSIVE METABOLIC PANEL
ALK PHOS: 99 U/L (ref 38–126)
ALT: 38 U/L (ref 14–54)
AST: 31 U/L (ref 15–41)
Albumin: 4 g/dL (ref 3.5–5.0)
Anion gap: 11 (ref 5–15)
BILIRUBIN TOTAL: 0.6 mg/dL (ref 0.3–1.2)
BUN: 12 mg/dL (ref 6–20)
CALCIUM: 9.5 mg/dL (ref 8.9–10.3)
CO2: 26 mmol/L (ref 22–32)
CREATININE: 0.75 mg/dL (ref 0.44–1.00)
Chloride: 102 mmol/L (ref 101–111)
GFR calc Af Amer: >60 mL/min (ref >60)
Glucose, Bld: 117 mg/dL — ABNORMAL HIGH (ref 65–99)
Potassium: 4.6 mmol/L (ref 3.5–5.1)
Sodium: 139 mmol/L (ref 135–145)
Total Protein: 6.9 g/dL (ref 6.5–8.1)

## 2017-10-14 LAB — CBC
HCT: 47 % — ABNORMAL HIGH (ref 36.0–46.0)
Hemoglobin: 15.6 g/dL — ABNORMAL HIGH (ref 12.0–15.0)
MCH: 31.8 pg (ref 26.0–34.0)
MCHC: 33.2 g/dL (ref 30.0–36.0)
MCV: 95.7 fL (ref 78.0–100.0)
Platelets: 193 10*3/uL (ref 150–400)
RBC: 4.91 MIL/uL (ref 3.87–5.11)
RDW: 12.9 % (ref 11.5–15.5)
WBC: 8.8 10*3/uL (ref 4.0–10.5)

## 2017-10-14 MED ORDER — IOHEXOL 300 MG/ML  SOLN: 25.0000 mL | INTRAMUSCULAR | Status: DC

## 2017-10-14 MED ORDER — METOCLOPRAMIDE HCL 10 MG PO TABS: 5.0000 mg | ORAL_TABLET | Freq: Four times a day (QID) | ORAL | 0 refills | Status: DC | PRN

## 2017-10-14 MED ORDER — METOCLOPRAMIDE HCL 10 MG PO TABS
5.0000 mg | ORAL_TABLET | Freq: Once | ORAL | Status: AC
  Administered 2017-10-14: 5 mg via ORAL
  Filled 2017-10-14: qty 1

## 2017-10-14 MED ORDER — PROMETHAZINE HCL 25 MG/ML IJ SOLN
12.5000 mg | Freq: Once | INTRAMUSCULAR | Status: AC
  Administered 2017-10-14: 12.5 mg via INTRAVENOUS
  Filled 2017-10-14: qty 1

## 2017-10-14 MED ORDER — GI COCKTAIL ~~LOC~~
30.0000 mL | Freq: Once | ORAL | Status: AC
  Administered 2017-10-14: 30 mL via ORAL
  Filled 2017-10-14: qty 30

## 2017-10-14 MED ORDER — ONDANSETRON 4 MG PO TBDP
4.0000 mg | ORAL_TABLET | Freq: Once | ORAL | Status: AC | PRN
  Administered 2017-10-14: 4 mg via ORAL
  Filled 2017-10-14: qty 1

## 2017-10-14 MED ORDER — SODIUM CHLORIDE 0.9 % IV BOLUS
1000.0000 mL | Freq: Once | INTRAVENOUS | Status: AC
  Administered 2017-10-14: 1000 mL via INTRAVENOUS

## 2017-10-14 MED ORDER — IOHEXOL 300 MG/ML  SOLN
100.0000 mL | INTRAMUSCULAR | Status: AC
  Administered 2017-10-14: 100 mL via INTRAVENOUS

## 2017-10-14 NOTE — ED Notes (Signed)
Pt in CT at this time.

## 2017-10-14 NOTE — ED Notes (Signed)
Pt wheeled to car by this RN.

## 2017-10-14 NOTE — ED Triage Notes (Signed)
Pt reports being hospitalized for diverticulitis in April. Has return of mid and lower abd pain with n/v/d and chills.

## 2017-10-14 NOTE — ED Provider Notes (Signed)
[facility] Provider Note   CSN: [id number] Arrival date & time: 10/14/17  6812     History   Chief Complaint Chief Complaint  Patient presents with  . Abdominal Pain  . Emesis    HPI Karen Casey is a 62 y.o. female.  HPI   62 year old female presenting with epigastric discomfort and vomiting.  Patient reports that she was recently in the hospital for diverticulitis last month.  She reports getting better and taking all of the antibiotics prescribed at home..  And then felt better until this morning when she vomited once.  She feels nauseated.  Patient has difficulty describing whether she has abdominal pain or not..  Patient does reports suprapubic discomfort.  Denies any fever.  Past Medical History:  Diagnosis Date  . Alcohol abuse   . Allergy   . Anxiety   . Cataract 06/09/2012   Right eye and left eye  . Depression   . GERD (gastroesophageal reflux disease)   . 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 Seizures ([place])    xanax withdrawl- December 2013  . Urinary incontinence 06/09/2012    Patient Active Problem List   Diagnosis Date Noted  . Perforated diverticulum   . Sepsis ([place]) 09/27/2017  . Diverticulitis of large intestine with perforation 09/27/2017  . Foot pain, bilateral 08/23/2017  . Onychomycosis 01/07/2016  . Allergic conjunctivitis 12/22/2015  . Urinary urgency 12/26/2013  . Broken teeth 12/20/2013  . Back pain 02/14/2013  . Cannabis dependence, unspecified 06/22/2012  . Anxiety disorder 06/12/2012  . Alcohol addiction ([place]) 05/28/2012  . Irritable bowel syndrome 05/10/2012  . Elevated blood pressure reading 05/10/2012  . Smoker 02/17/2012  . GERD (gastroesophageal reflux disease) 02/04/2012  . Depression 02/04/2012  . Preventative health care 02/01/2012    Past Surgical History:  Procedure Laterality Date  . APPENDECTOMY    . CATARACT EXTRACTION  06/09/2012   Left eye  .  left shoulder dislocation  Sept 2011     OB History   None      Home Medications    Prior to Admission medications   Medication Sig Start Date End Date Taking? Authorizing Provider  ALPRAZolam Duanne Moron) 1 MG tablet Take 1 mg by mouth 4 (four) times daily. 08/29/17   [provider]  citalopram (CELEXA) 40 MG tablet Take 40 mg by mouth daily. 09/04/17   [provider]  esomeprazole (NEXIUM) 40 MG capsule TAKE 1 CAPSULE BY MOUTH EVERY MORNING WITH BREAKFAST 08/23/17   Alveda Reasons, MD  fluticasone [facility]) 50 MCG/ACT nasal spray Place 2 sprays into both nostrils daily. 08/23/17   Alveda Reasons, MD  gabapentin (NEURONTIN) 600 MG tablet Take 600 mg by mouth 4 (four) times daily. 09/14/17   [provider]  metoCLOPramide (REGLAN) 10 MG tablet Take 0.5 tablets (5 mg total) by mouth every 6 (six) hours as needed for nausea. 10/14/17   Mackuen, Courteney Lyn, MD  ranitidine (ZANTAC) 150 MG capsule Take 1 capsule (150 mg total) by mouth 2 (two) times daily. 08/23/17   Alveda Reasons, MD  sucralfate (CARAFATE) 1 g tablet Take 1 tablet (1 g total) by mouth 4 (four) times daily as needed. 08/23/17   Alveda Reasons, MD  tiZANidine (ZANAFLEX) 4 MG tablet Take 4 mg by mouth 2 (two) times daily. 07/27/17   [provider]    Family History Family History  Problem Relation Age of Onset  .  Hypertension Mother   . Hyperlipidemia Mother   . Heart disease Father   . Depression Father   . Parkinsonism Father   . Colon cancer Neg Hx   . Esophageal cancer Neg Hx   . Rectal cancer Neg Hx   . Stomach cancer Neg Hx     Social History Social History   Tobacco Use  . Smoking status: Current Every Day Smoker    Packs/day: 0.50    Years: 37.00    Pack years: 18.50    Types: Cigarettes  . Smokeless tobacco: Never Used  Substance Use Topics  . Alcohol use: Yes    Alcohol/week: 1.8 oz    Types: 3 Cans of beer per week    Comment: 2-3 times  . Drug use: Yes      Types: Marijuana, Other-see comments    Comment: Past hx of benzo abuse--xanax     Allergies   Patient has no known allergies.   Review of Systems Review of Systems  Constitutional: Negative for activity change.  Respiratory: Negative for shortness of breath.   Cardiovascular: Negative for chest pain.  Gastrointestinal: Positive for abdominal pain, nausea and vomiting.  All other systems reviewed and are negative.    Physical Exam Updated Vital Signs BP (!) 172/98   Pulse 76   Temp 98.1 F (36.7 C) (Oral)   Resp 16   SpO2 97%   Physical Exam  Constitutional: She is oriented to person, place, and time. She appears well-developed and well-nourished.  HENT:  Head: Normocephalic and atraumatic.  Dry mucous membranes.  Eyes: Right eye exhibits no discharge.  Cardiovascular: Normal rate.  Pulmonary/Chest: Effort normal.  Abdominal: Normal appearance and bowel sounds are normal. There is tenderness.  Mild tenderness diffusely.  Scar.  Neurological: She is oriented to person, place, and time.  Skin: Skin is warm and dry. She is not diaphoretic.  Psychiatric: She has a normal mood and affect.  Nursing note and vitals reviewed.    ED Treatments / Results  Labs (all labs ordered are listed, but only abnormal results are displayed) Labs Reviewed  COMPREHENSIVE METABOLIC PANEL - Abnormal; Notable for the following components:      Result Value   Glucose, Bld 117 (*)    All other components within normal limits  CBC - Abnormal; Notable for the following components:   Hemoglobin 15.6 (*)    HCT 47.0 (*)    All other components within normal limits  RAPID URINE DRUG SCREEN, HOSP PERFORMED - Abnormal; Notable for the following components:   Benzodiazepines POSITIVE (*)    Tetrahydrocannabinol POSITIVE (*)    All other components within normal limits  LIPASE, BLOOD  URINALYSIS, ROUTINE W REFLEX MICROSCOPIC    EKG None  Radiology Ct Abdomen Pelvis W  Contrast  Result Date: 10/14/2017 CLINICAL DATA:  Lower abdominal pain, nausea/vomiting/diarrhea, recent diverticulitis EXAM: CT ABDOMEN AND PELVIS WITH CONTRAST TECHNIQUE: Multidetector CT imaging of the abdomen and pelvis was performed using the standard protocol following bolus administration of intravenous contrast. CONTRAST:  135mL OMNIPAQUE IOHEXOL 300 MG/ML  SOLN COMPARISON:  09/27/2017 FINDINGS: Motion degraded images. Lower chest: Lung bases are clear. Hepatobiliary: Liver is within normal limits. Gallbladder is unremarkable. No intrahepatic or extrahepatic ductal dilatation. Pancreas: Within normal limits. Spleen: Within normal limits. Adrenals/Urinary Tract: Adrenal glands within normal limits. 4 mm left upper pole renal cyst (series 3/image 27). Right kidney is within normal limits. No hydronephrosis. Bladder is within normal limits. Stomach/Bowel: Stomach is notable for  a tiny hiatal hernia. No evidence of bowel obstruction. Normal appendix (series 3/image 58). Mild sigmoid diverticulosis, without evidence of diverticulitis. Residual wall thickening/mucosal hypertrophy involving the sigmoid colon. Vascular/Lymphatic: No evidence of abdominal aortic aneurysm. Mild atherosclerotic calcifications of the abdominal aorta and branch vessels. No suspicious abdominopelvic lymphadenopathy. Reproductive: Uterus is within normal limits. No adnexal masses. Other: Residual 9 mm fluid collection at the site of prior pelvic abscess between the sigmoid colon and rectum (series 7/image 73). Musculoskeletal: Degenerative changes of the visualized thoracolumbar spine. IMPRESSION: Prior sigmoid diverticulitis has resolved. Additional 9 mm fluid collection at the site of prior pelvic abscess between the sigmoid colon and rectum. Additional ancillary findings as above. Electronically Signed   By: Julian Hy M.D.   On: 10/14/2017 14:37    Procedures Procedures (including critical care time)  Medications Ordered  in ED Medications  ondansetron (ZOFRAN-ODT) disintegrating tablet 4 mg (4 mg Oral Given 10/14/17 0848)  sodium chloride 0.9 % bolus 1,000 mL (0 mLs Intravenous Stopped 10/14/17 1517)  gi cocktail (Maalox,Lidocaine,Donnatal) (30 mLs Oral Given 10/14/17 1329)  promethazine (PHENERGAN) injection 12.5 mg (12.5 mg Intravenous Given 10/14/17 1257)  iohexol (OMNIPAQUE) 300 MG/ML solution 100 mL (100 mLs Intravenous Contrast Given 10/14/17 1351)  sodium chloride 0.9 % bolus 1,000 mL (1,000 mLs Intravenous New Bag/Given 10/14/17 1835)  metoCLOPramide (REGLAN) tablet 5 mg (5 mg Oral Given 10/14/17 1836)     Initial Impression / Assessment and Plan / ED Course  I have reviewed the triage vital signs and the nursing notes.  Pertinent labs & imaging results that were available during my care of the patient were reviewed by me and considered in my medical decision making (see chart for details).    61 year old female presenting with epigastric discomfort and vomiting.  Patient reports that she was recently in the hospital for diverticulitis last month.  She reports getting better and taking all of the antibiotics prescribed at home..  And then felt better until this morning when she vomited once.  She feels nauseated.  Patient has difficulty describing whether she has abdominal pain or not..  Patient does reports suprapubic discomfort.  Denies any fever.  12:39 PM I suspect that this actually peptic ulcer disease.  Versus gastritis.  However given her high risk for developing abscess given recent diverticulitis, will get CT abdomen pelvis.  Will give fluids and symptomatic care.   7:16 PM CT is showing.  Shows resolution of the large abscess that was present several weeks ago..  Labs are reassuring.  Patient has no abdominal pain anymore.  Patient rested very heavily for a couple hours here in the emergency department.  Then she started complaining of nausea again.  Vomited one time.  Patient's been able to tolerate  p.o. now.  Has not had vomiting the last hours.  Patient's vital signs are reassuring.  I had a long discussion with patient and she appears to have had a quite extensive work-up for this in the past.  She reports already being on omeprazole and sulcrafate.  We recommend that she stay compliant with these medications, stop her marijuana use, and follow-up with gastroenterology this week.  Patient reports that she sees [facility]. Final Clinical Impressions(s) / ED Diagnoses   Final diagnoses:  Nausea and vomiting, intractability of vomiting not specified, unspecified vomiting type    ED Discharge Orders        Ordered    metoCLOPramide (REGLAN) 10 MG tablet  Every 6 hours PRN  10/14/17 1916       Macarthur Critchley, MD 10/14/17 1918

## 2017-10-14 NOTE — ED Notes (Signed)
Pt reports pain is the same after GI cocktail administration. Pt noted to be resting comfortably on rounding, asleep in bed shortly after returning from restroom.

## 2017-10-14 NOTE — Discharge Instructions (Signed)
We are unsure was causing your nausea vomiting.  We want you to follow-up with the gastroenterologist.  We know that this is been going on for several years.  Please return with any concerning signs or symptoms.

## 2017-10-14 NOTE — ED Notes (Signed)
Pt given Sprite Zero 

## 2017-10-14 NOTE — ED Notes (Signed)
Pt with second episode of emesis on the floor. Pt given multiple emesis bags within reach and advised to use them when nauseated. Pt verbalized understanding. Floor cleaned with bleach wipes. Pt given new sheets and warm blankets. Pt repositioned.

## 2017-10-14 NOTE — ED Notes (Signed)
Pt given a few sips of fluids. Pt denies N/V at this time. Pt visibly upset when this RN left the lights on to encourage pt to wake up and drink fluids. Emphasized to pt need to continue drinking. Pt repositioned upright in the bed and sprite zero and water available next to patient.

## 2017-10-14 NOTE — ED Notes (Addendum)
ED Provider at bedside. Notified of pt most recent BP. BP cuff adjusted and pt advised to remain relaxed.

## 2017-10-14 NOTE — ED Notes (Signed)
Observed pt attempting to cause herself to vomit by inserting fingers into mouth while in waiting room.  I advised her not to do this.

## 2017-10-14 NOTE — ED Notes (Signed)
F/u with main lab regarding urine drug screen

## 2017-10-14 NOTE — ED Notes (Addendum)
I saw pt sticking two finger down throat to her and make herself vomit. I informed pt that, that is not appropriate.

## 2017-10-14 NOTE — ED Notes (Signed)
ED Provider at bedside. 

## 2017-10-14 NOTE — ED Notes (Signed)
Got patient into a gown on the monitor patient is resting with call bell in reach 

## 2017-10-14 NOTE — ED Notes (Addendum)
Pt with episode of emesis on the floor. Dr Thomasene Lot notified.

## 2017-10-14 NOTE — ED Notes (Signed)
Pt ambulatory to restroom with steady gait.

## 2017-10-14 NOTE — ED Notes (Addendum)
Pt with no episode of vomiting since administration of reglan. Pt able to take a few sips of gingerale with no emesis. Emphasized importance of f/u with GI and encouraged pt to call and try to schedule earlier appt. Pt verbalized understanding. Pt verbalized understanding of all d/c instructions, prescriptions, and f/u information. Opportunity for questioning and answers provided. All belongings with patient at this time. EDP aware of pt BP, pt encouraged to recheck BP and to f/u with PCP

## 2017-10-14 NOTE — ED Notes (Signed)
Pt requesting more nausea medication now that pt is more alert. Pt with no episodes of vomiting. Will notify Dr Thomasene Lot.

## 2017-10-17 NOTE — Progress Notes (Signed)
This encounter was created in error - please disregard.

## 2017-10-25 ENCOUNTER — Ambulatory Visit: Payer: BLUE CROSS/BLUE SHIELD | Admitting: Family Medicine

## 2017-10-25 ENCOUNTER — Other Ambulatory Visit: Payer: Self-pay

## 2017-10-25 ENCOUNTER — Encounter: Payer: Self-pay | Admitting: Family Medicine

## 2017-10-25 VITALS — BP 164/100 | HR 79 | Temp 98.4°F | Ht 66.0 in | Wt 182.4 lb

## 2017-10-25 DIAGNOSIS — K5720 Diverticulitis of large intestine with perforation and abscess without bleeding: Secondary | ICD-10-CM

## 2017-10-25 DIAGNOSIS — R030 Elevated blood-pressure reading, without diagnosis of hypertension: Secondary | ICD-10-CM | POA: Diagnosis not present

## 2017-10-25 DIAGNOSIS — K580 Irritable bowel syndrome with diarrhea: Secondary | ICD-10-CM | POA: Diagnosis not present

## 2017-10-25 DIAGNOSIS — Z1231 Encounter for screening mammogram for malignant neoplasm of breast: Secondary | ICD-10-CM

## 2017-10-25 DIAGNOSIS — Z1239 Encounter for other screening for malignant neoplasm of breast: Secondary | ICD-10-CM

## 2017-10-25 NOTE — Assessment & Plan Note (Signed)
Elevated at today's visit.  She will follow-up with a nurse visit next week.  If it continues remain elevated we will start her blood pressure medication.

## 2017-10-25 NOTE — Assessment & Plan Note (Signed)
Continues to have ongoing abdominal pain plus chronic nausea.  No acute worsening of this.  She has had no trouble with this since being discharged from the emergency department earlier this month.

## 2017-10-25 NOTE — Progress Notes (Signed)
Subjective:    Karen Casey  is a 62 y.o. female who presents to [facility] today for FU from hospital/ED visits:  1.  Diverticulitis and nausea/vomiting:  Recently admitted for perforated diverticulitis.  D/C'ed after improvement.  On abx outpatient and was doing well until earlier this month when she began to have recurrent nausea and vomiting.  She presented to the emergency department for concern that she was having another attack of diverticulitis.  CT scan that time was negative and showed just some mild residual fluid at the site of her previous abscess.  Since then she has had her chronic nausea and epigastric abdominal pain that she has had since she was a young child.  No acute worsening of her abdominal pain or nausea or vomiting.  She is eating and drinking well.  No dysuria.  Infrequent diarrhea which is chronic and normal for her.  No fevers or chills.  2. Hypertension:  Long-term problem for this patient.  No adverse effects from medication.  Not checking it regularly.  No HA, CP, dizziness, shortness of breath, palpitations, or LE swelling.   BP Readings from Last 3 Encounters:  10/25/17 (!) 162/98  10/14/17 (!) 165/95  09/29/17 (!) 153/98    Prev health:  Currently overdue for mammogram.  Order placed again today.  ROS as above per HPI.     The following portions of the patient's history were reviewed and updated as appropriate: allergies, current medications, past medical history, family and social history, and problem list. Patient is a nonsmoker.    PMH reviewed.  Past Medical History:  Diagnosis Date  . Alcohol abuse   . Allergy   . Anxiety   . Cataract 06/09/2012   Right eye and left eye  . Depression   . GERD (gastroesophageal reflux disease)   . 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 Seizures ([place])    xanax withdrawl- December 2013  . Urinary incontinence 06/09/2012   Past Surgical History:  Procedure Laterality Date  . APPENDECTOMY     . CATARACT EXTRACTION  06/09/2012   Left eye  . left shoulder dislocation  Sept 2011    Medications reviewed. Current Outpatient Medications  Medication Sig Dispense Refill  . ALPRAZolam (XANAX) 1 MG tablet Take 1 mg by mouth 4 (four) times daily.  4  . citalopram (CELEXA) 40 MG tablet Take 40 mg by mouth daily.  11  . esomeprazole (NEXIUM) 40 MG capsule TAKE 1 CAPSULE BY MOUTH EVERY MORNING WITH BREAKFAST 90 capsule 1  . fluticasone (FLONASE) 50 MCG/ACT nasal spray Place 2 sprays into both nostrils daily. 16 g 6  . gabapentin (NEURONTIN) 600 MG tablet Take 600 mg by mouth 4 (four) times daily.  11  . metoCLOPramide (REGLAN) 10 MG tablet Take 0.5 tablets (5 mg total) by mouth every 6 (six) hours as needed for nausea. 5 tablet 0  . ranitidine (ZANTAC) 150 MG capsule Take 1 capsule (150 mg total) by mouth 2 (two) times daily. 60 capsule 3  . sucralfate (CARAFATE) 1 g tablet Take 1 tablet (1 g total) by mouth 4 (four) times daily as needed. 120 tablet 2  . tiZANidine (ZANAFLEX) 4 MG tablet Take 4 mg by mouth 2 (two) times daily.  0   No current facility-administered medications for this visit.      Objective:   Physical Exam BP (!) 162/98   Pulse 79   Temp 98.4 F (36.9 C) (Oral)   Ht  5\' 6"  (1.676 m)   Wt 182 lb 6.4 oz (82.7 kg)   SpO2 98%   BMI 29.44 kg/m  Gen:  Alert, cooperative patient who appears stated age in no acute distress.  Vital signs reviewed. HEENT: EOMI,  MMM Cardiac:  Regular rate and rhythm without murmur auscultated.  Good S1/S2. Pulm:  Clear to auscultation bilaterally with good air movement.  No wheezes or rales noted.   Abd:  Soft/nondistended/nontender.  Good bowel sounds throughout all four quadrants.  No masses noted.  Exts: Non edematous BL  LE, warm and well perfused.   No results found for this or any previous visit (from the past 72 hour(s)).

## 2017-10-25 NOTE — Patient Instructions (Signed)
It was good to see you again today.    Keep you appointment with your GI doctor.    Your blood pressure was a little high today.  Come back in 1-2 weeks for a nurse visit.  If it is still high at that visit, we may need to start some medication.  Come back and see me in 4 - 6 months depending on what the GI doctor says.

## 2017-10-25 NOTE — Assessment & Plan Note (Signed)
Doing well today.  No further issues with this.

## 2017-10-26 NOTE — Progress Notes (Signed)
[facility] Consult Note:  History: Karen Casey 10/27/2017  Referring physician: Alveda Reasons, MD  Reason for consult/chief complaint: Diverticulitis (recent hospitalization for diverticulitis on 2 different occasions; acute abdominal pain has subsided but still having diarrhea (6-7 watery bm daily); no blood in stool ) and Gastroesophageal Reflux (patient currently on Nexium and Zantac; does still have some gerd symptoms on medication)   Subjective  HPI:  This is a 62 year old woman referred by primary care months ago for ongoing chronic abdominal pain nausea, vomiting and diarrhea.  She had seen Dr. Deatra Casey in 2014 for similar symptoms and underwent endoscopic work-up.  Upper endoscopy May 2014 unremarkable.  Colonoscopy June 2014 unremarkable, biopsies negative for microscopic colitis.  She was a same-day cancellation for her visit with me in February and again in April.  She was admitted to the hospital in late April for severe diverticulitis with a contained perforation treated with bowel rest and antibiotics.  Surgery was consulted but no surgical intervention was required.  This is based on the discharge summary of that hospitalization.  She was then in the emergency department on May 3 with an episode of upper abdominal pain and vomiting that day.  She was treated symptomatically and a repeat CT of the abdomen was obtained.  It showed resolution of the abscess, some persistent mucosal thickening in the sigmoid colon and a 9 mm fluid collection in the site of previous abscess.  She reports lifelong nausea and chronic abdominal pain. Although there were many years of chronic diarrhea, including when last seen by Dr. Deatra Casey, at some point things seemed to settle out and she was having a bowel movement on average once daily of normal character for the last several years. Since the treatment for diverticulitis,The left lower quadrant pain has resolved, but there has  been intermittent loose non-bloody stool. She had previously been taking a large amount of flaxseeds as part of a diet, but has stopped that. She also uses marijuana daily and finds it helps her nausea. She was given prescription for metoclopramide the time of hospital discharge and wonders if she should continue it. Recall any other antibiotic she may abuse in the past.  In addition, she has read through symptoms of GERD despite taking Nexium and Zantac. She denies dysphagia odynophagia, anorexia or weight loss.   ROS:  Review of Systems  Constitutional: Negative for appetite change and unexpected weight change.  HENT: Negative for mouth sores and voice change.   Eyes: Negative for pain and redness.  Respiratory: Positive for cough. Negative for shortness of breath.   Cardiovascular: Negative for chest pain and palpitations.  Genitourinary: Negative for dysuria and hematuria.  Musculoskeletal: Positive for arthralgias. Negative for myalgias.  Skin: Negative for pallor and rash.  Neurological: Negative for weakness and headaches.  Hematological: Negative for adenopathy.  Psychiatric/Behavioral:       Anxiety     Past Medical History: Past Medical History:  Diagnosis Date  . Alcohol abuse   . Allergy   . Anxiety   . Cataract 06/09/2012   Right eye and left eye  . Depression   . GERD (gastroesophageal reflux disease)   . 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 Seizures ([place])    xanax withdrawl- December 2013  . Urinary incontinence 06/09/2012     Past Surgical History: Past Surgical History:  Procedure Laterality Date  . APPENDECTOMY    . CATARACT EXTRACTION  06/09/2012  Left eye  . left shoulder dislocation  Sept 2011     Family History: Family History  Problem Relation Age of Onset  . Hypertension Mother   . Hyperlipidemia Mother   . Heart disease Father   . Depression Father   . Parkinsonism Father   . Colon cancer Neg Hx   . Esophageal  cancer Neg Hx   . Rectal cancer Neg Hx   . Stomach cancer Neg Hx     Social History: Social History   Socioeconomic History  . Marital status: Single    Spouse name: Not on file  . Number of children: Not on file  . Years of education: Not on file  . Highest education level: Not on file  Occupational History  . Not on file  Social Needs  . Financial resource strain: Not on file  . Food insecurity:    Worry: Not on file    Inability: Not on file  . Transportation needs:    Medical: Not on file    Non-medical: Not on file  Tobacco Use  . Smoking status: Current Every Day Smoker    Packs/day: 0.50    Years: 37.00    Pack years: 18.50    Types: Cigarettes  . Smokeless tobacco: Never Used  Substance and Sexual Activity  . Alcohol use: Yes    Alcohol/week: 1.8 oz    Types: 3 Cans of beer per week    Comment: 2-3 times  . Drug use: Yes    Types: Marijuana, Other-see comments    Comment: Past hx of benzo abuse--xanax  . Sexual activity: Not on file  Lifestyle  . Physical activity:    Days per week: Not on file    Minutes per session: Not on file  . Stress: Not on file  Relationships  . Social connections:    Talks on phone: Not on file    Gets together: Not on file    Attends religious service: Not on file    Active member of club or organization: Not on file    Attends meetings of clubs or organizations: Not on file    Relationship status: Not on file  Other Topics Concern  . Not on file  Social History Narrative  . Not on file    Allergies: No Known Allergies  Outpatient Meds: Current Outpatient Medications  Medication Sig Dispense Refill  . ALPRAZolam (XANAX) 1 MG tablet Take 1 mg by mouth 4 (four) times daily as needed.   4  . citalopram (CELEXA) 40 MG tablet Take 40 mg by mouth daily.  11  . esomeprazole (NEXIUM) 40 MG capsule TAKE 1 CAPSULE BY MOUTH EVERY MORNING WITH BREAKFAST 90 capsule 1  . fluticasone (FLONASE) 50 MCG/ACT nasal spray Place 2  sprays into both nostrils daily. 16 g 6  . gabapentin (NEURONTIN) 600 MG tablet Take 1,200 mg by mouth 3 (three) times daily.   11  . metoCLOPramide (REGLAN) 10 MG tablet Take 0.5 tablets (5 mg total) by mouth every 6 (six) hours as needed for nausea. 5 tablet 0  . Multiple Vitamins-Minerals (CENTRUM SILVER 50+WOMEN PO) Take 1 tablet by mouth daily.    . polyvinyl alcohol (LIQUITEARS) 1.4 % ophthalmic solution Place 3 drops into both eyes as needed for dry eyes.    . Probiotic Product (PROBIOTIC PO) Take 1 capsule by mouth daily.    . ranitidine (ZANTAC) 150 MG capsule Take 1 capsule (150 mg total) by mouth 2 (two) times daily.  60 capsule 3  . sucralfate (CARAFATE) 1 g tablet Take 1 tablet (1 g total) by mouth 4 (four) times daily as needed. 120 tablet 2  . tiZANidine (ZANAFLEX) 4 MG tablet Take 4-8 mg by mouth at bedtime.   0  . ondansetron (ZOFRAN) 4 MG tablet Take 1 tablet (4 mg total) by mouth 2 (two) times daily as needed for nausea or vomiting (BID as needed for nausea). 60 tablet 2   No current facility-administered medications for this visit.       ___________________________________________________________________ Objective   Exam:  BP (!) 160/70   Pulse 84   Ht 5\' 6"  (1.676 m)   Wt 180 lb 12.8 oz (82 kg)   BMI 29.18 kg/m    General: this is a(n) pleasant woman in no acute distress   Eyes: sclera anicteric, no redness  ENT: oral mucosa moist without lesions, no cervical or supraclavicular lymphadenopathy, good dentition  CV: RRR without murmur, S1/S2, no JVD, no peripheral edema  Resp: clear to auscultation bilaterally, normal RR and effort noted  GI: soft, mild upper diffuse tenderness (which she says has been the case decades), with active bowel sounds. No guarding or palpable organomegaly noted.  Skin; warm and dry, no rash or jaundice noted  Neuro: awake, alert and oriented x 3. Normal gross motor function and fluent speech  Labs: CBC Latest Ref Rng & Units  10/14/2017 09/29/2017 09/27/2017  WBC 4.0 - 10.5 K/uL 8.8 4.6 11.0(H)  Hemoglobin 12.0 - 15.0 g/dL 15.6(H) 12.9 13.3  Hematocrit 36.0 - 46.0 % 47.0(H) 39.2 40.6  Platelets 150 - 400 K/uL 193 232 209   CMP Latest Ref Rng & Units 10/14/2017 09/29/2017 09/27/2017  Glucose 65 - 99 mg/dL 117(H) 126(H) 110(H)  BUN 6 - 20 mg/dL 12 5(L) 8  Creatinine 0.44 - 1.00 mg/dL 0.75 0.68 0.62  Sodium 135 - 145 mmol/L 139 140 138  Potassium 3.5 - 5.1 mmol/L 4.6 4.2 3.1(L)  Chloride 101 - 111 mmol/L 102 107 101  CO2 22 - 32 mmol/L 26 24 23   Calcium 8.9 - 10.3 mg/dL 9.5 8.7(L) 8.8(L)  Total Protein 6.5 - 8.1 g/dL 6.9 - 6.5  Total Bilirubin 0.3 - 1.2 mg/dL 0.6 - 0.9  Alkaline Phos 38 - 126 U/L 99 - 64  AST 15 - 41 U/L 31 - 13(L)  ALT 14 - 54 U/L 38 - 16     Radiologic Studies:  Report reviewed for CTAP 10/14/17 Images were personally reviewed.  Assessment: Encounter Diagnoses  Name Primary?  . Diverticulitis of colon Yes  . Diarrhea, unspecified type   . Gastroesophageal reflux disease, esophagitis presence not specified   . Nausea without vomiting   . Cannabis dependence ([place])   . Smoking     Recent severe diverticulitis with contained perforation and abscess treated with bowel rest, hospital stay and prolonged antibiotics. This is the first episode she has ever had. We discussed diverticulitis, and how we are unable to predict whether or not she will have a recurrent episode. The intermittent diarrhea is probably related to prolonged use of antibiotics, but we will check a C. Difficile PCR. She is artery taking a probiotic by her report, but we gave her week supply of Align in case there is persistent dysbiosis. She has persistent breakthrough symptoms of reflux primarily because she continues to smoke. There is no dysphagia or weight loss to suggest malignancy. She was strongly advised to stop smoking, she will continue current antisecretory therapy but stop  sucralfate.  Zofran was prescribed as needed  for nausea. I will see her in clinic as needed.  Thank you for the courtesy of this consult.  Please call me with any questions or concerns.  Nelida Meuse III  CC: Alveda Reasons, MD

## 2017-10-27 ENCOUNTER — Encounter: Payer: Self-pay | Admitting: Gastroenterology

## 2017-10-27 ENCOUNTER — Ambulatory Visit: Payer: BLUE CROSS/BLUE SHIELD | Admitting: Gastroenterology

## 2017-10-27 VITALS — BP 160/70 | HR 84 | Ht 66.0 in | Wt 180.8 lb

## 2017-10-27 DIAGNOSIS — K219 Gastro-esophageal reflux disease without esophagitis: Secondary | ICD-10-CM

## 2017-10-27 DIAGNOSIS — K5732 Diverticulitis of large intestine without perforation or abscess without bleeding: Secondary | ICD-10-CM

## 2017-10-27 DIAGNOSIS — R197 Diarrhea, unspecified: Secondary | ICD-10-CM

## 2017-10-27 DIAGNOSIS — R110 Nausea: Secondary | ICD-10-CM | POA: Diagnosis not present

## 2017-10-27 DIAGNOSIS — F1220 Cannabis dependence, uncomplicated: Secondary | ICD-10-CM | POA: Diagnosis not present

## 2017-10-27 DIAGNOSIS — F17200 Nicotine dependence, unspecified, uncomplicated: Secondary | ICD-10-CM

## 2017-10-27 MED ORDER — ONDANSETRON HCL 4 MG PO TABS: 4.0000 mg | ORAL_TABLET | Freq: Two times a day (BID) | ORAL | 2 refills | Status: DC | PRN

## 2017-10-27 NOTE — Patient Instructions (Addendum)
If you are age 60 or older, your body mass index should be between 23-30. Your Body mass index is 29.18 kg/m. If this is out of the aforementioned range listed, please consider follow up with your Primary Care Provider.  If you are age 48 or younger, your body mass index should be between 19-25. Your Body mass index is 29.18 kg/m. If this is out of the aformentioned range listed, please consider follow up with your Primary Care Provider.   Your provider has requested that you go to the basement level for lab work before leaving today. Press "B" on the elevator. The lab is located at the first door on the left as you exit the elevator.  We have given you samples of the following medication to take: Align samples exp 12-2017 Take 1 day.  Stop taking the Carafate (sucralfate)    Thank you for choosing [facility]  Dr Wilfrid Lund III

## 2017-11-02 ENCOUNTER — Telehealth: Payer: Self-pay | Admitting: Podiatry

## 2017-11-02 NOTE — Telephone Encounter (Signed)
Pt. Michela Pitcher you come through her line at [employer] and you would like for [place] to squeeze her in before her insurance ends on June 1. Dr. Amalia Hailey you are completely booked. Please advise, where you would like for me to put the patient. [employer]

## 2017-11-08 ENCOUNTER — Telehealth: Payer: Self-pay | Admitting: Gastroenterology

## 2017-11-08 NOTE — Telephone Encounter (Signed)
Left a message that Humphrey Rolls is over the counter and that no Rx is needed.

## 2017-11-09 ENCOUNTER — Ambulatory Visit: Payer: BLUE CROSS/BLUE SHIELD | Admitting: Podiatry

## 2017-11-09 ENCOUNTER — Ambulatory Visit (INDEPENDENT_AMBULATORY_CARE_PROVIDER_SITE_OTHER): Payer: BLUE CROSS/BLUE SHIELD

## 2017-11-09 DIAGNOSIS — M21611 Bunion of right foot: Secondary | ICD-10-CM

## 2017-11-09 DIAGNOSIS — M2041 Other hammer toe(s) (acquired), right foot: Secondary | ICD-10-CM

## 2017-11-09 DIAGNOSIS — M2042 Other hammer toe(s) (acquired), left foot: Secondary | ICD-10-CM

## 2017-11-09 DIAGNOSIS — Q828 Other specified congenital malformations of skin: Secondary | ICD-10-CM | POA: Diagnosis not present

## 2017-11-09 DIAGNOSIS — B351 Tinea unguium: Secondary | ICD-10-CM | POA: Diagnosis not present

## 2017-11-09 DIAGNOSIS — M2040 Other hammer toe(s) (acquired), unspecified foot: Secondary | ICD-10-CM

## 2017-11-09 DIAGNOSIS — M21612 Bunion of left foot: Secondary | ICD-10-CM | POA: Diagnosis not present

## 2017-11-09 MED ORDER — TERBINAFINE HCL 250 MG PO TABS: 250.0000 mg | ORAL_TABLET | Freq: Every day | ORAL | 0 refills | Status: DC

## 2017-11-10 ENCOUNTER — Telehealth: Payer: Self-pay

## 2017-11-10 NOTE — Telephone Encounter (Signed)
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 Originally it was 190/something, on recheck it was 181/89 and 173/86. They also felt she was dehydrated. She has drank a couple of bottles of water since then and feels better with the dizziness. Denies chest pain, shortness of breath or HA. States they checked her for all that.  States PCP aware BP has been high and would like to go ahead and be placed on BP medication. States her insurance runs out tomorrow and picks up again on 12/12/17. There was some kind of problem with her taxes so she will not have insurance during June.  Please go ahead and prescribe something by tomorrow and let her know so she can pick up.  [facility] on [address].  Call back is [phone].  Danley Danker, RN [facility] RN)

## 2017-11-11 MED ORDER — HYDROCHLOROTHIAZIDE 25 MG PO TABS: 25.0000 mg | ORAL_TABLET | Freq: Every day | ORAL | 0 refills | Status: DC

## 2017-11-11 NOTE — Telephone Encounter (Signed)
Spoke with patient and told her Rx is at [facility]. Appt made for next week with PCP. Patient concerned about not having insurance in June but will keep appt because she understands it is important to treat her BP. Gave ED precautions for over the weekend. Verbalized understanding.  Danley Danker, RN [facility] RN)

## 2017-11-11 NOTE — Telephone Encounter (Signed)
Will place her on 30 days HCTZ.  However, with her BP that elevated, she needs to be re-seen to have it rechecked and figure out why it's suddenly so high.  She needs to make an appt to see me or someone else on Monday/Tuesday of next week.  If she begins experiencing any symptoms, she should be seen at Urgent Care or Ed over the weekend.   I have sent in the HCTZ.  Start this today.  I tried to call her at the number listed but a recording kept saying "Please try your call again later."    Please call her, or tell her this if she calls back.

## 2017-11-11 NOTE — Telephone Encounter (Signed)
Pt calling again checking on if this can be done. It needs to be done by the end of the day today. Spoke with Delrae Rend and she said she will be in touch with Mingo Amber to try and get this done by end of day.

## 2017-11-11 NOTE — Telephone Encounter (Signed)
Text page sent to PCP. Danley Danker, RN [facility] RN)

## 2017-11-13 NOTE — Progress Notes (Signed)
   Subjective: 62 year old female presenting today as a new patient with a chief complaint of bilateral foot pain, right worse than left, that began several months ago. She reports associated bunions and hammertoes bilaterally. She has not done anything to treat the symptoms. She states she stands for long periods of time for work which causes the pain to worsen. Patient is here for further evaluation and treatment.   Past Medical History:  Diagnosis Date  . Alcohol abuse   . Allergy   . Anxiety   . Cataract 06/09/2012   Right eye and left eye  . Depression   . GERD (gastroesophageal reflux disease)   . 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 Seizures ([place])    xanax withdrawl- December 2013  . Urinary incontinence 06/09/2012     Objective: Physical Exam General: The patient is alert and oriented x3 in no acute distress.  Dermatology: Hyperkeratotic lesion present on the left foot. Pain on palpation with a central nucleated core noted. Hyperkeratotic, discolored, thickened, onychodystrophy of nails noted bilaterally. Skin is cool, dry and supple bilateral lower extremities. Negative for open lesions or macerations.  Vascular: Palpable pedal pulses bilaterally. No edema or erythema noted. Capillary refill within normal limits.  Neurological: Epicritic and protective threshold grossly intact bilaterally.   Musculoskeletal Exam: Clinical evidence of bunion deformity noted to the respective foot. There is a moderate pain on palpation range of motion of the first MPJ. Lateral deviation of the hallux noted consistent with hallux abductovalgus. Hammertoe contracture also noted on clinical exam to digits 2-5 of the bilateral feet. Symptomatic pain on palpation and range of motion also noted to the metatarsal phalangeal joints of the respective hammertoe digits.    Radiographic Exam: Increased intermetatarsal angle greater than 15 with a hallux abductus angle greater than  30 noted on AP view. Moderate degenerative changes noted within the first MPJ. Contracture deformity also noted to the interphalangeal joints and MPJs of the digits of the respective hammertoes.    Assessment: 1. HAV w/ bunion deformity bilateral, right worse than left 2. Hammertoe deformity digits 2-5 of bilateral feet 3. Porokeratosis left  4. Onychomycosis nails 1-5 bilateral    Plan of Care:  1. Patient was evaluated. X-Rays reviewed. 2. Patient not ready for surgery at this time. We will continue conservative management.  3. Excisional debridement of keratoic lesion using a chisel blade was performed without incident. Light dressing applied.  4. Orders for liver function test placed.  5. Prescription for Lamisil 250 mg #90 provided to patient.  6. Return to clinic as needed when ready for surgery.    Edrick Kins, DPM [facility]  Dr. Edrick Kins, [address]                                        [place], [place] [address]                Office [phone]  Fax [phone]

## 2017-11-14 ENCOUNTER — Ambulatory Visit: Payer: BLUE CROSS/BLUE SHIELD | Admitting: Podiatry

## 2017-11-17 ENCOUNTER — Ambulatory Visit: Payer: Medicaid Other | Admitting: Family Medicine

## 2017-11-17 ENCOUNTER — Encounter: Payer: Self-pay | Admitting: Family Medicine

## 2017-11-17 ENCOUNTER — Other Ambulatory Visit: Payer: Self-pay

## 2017-11-17 VITALS — BP 168/86 | HR 91 | Temp 97.7°F | Ht 66.0 in | Wt 182.4 lb

## 2017-11-17 DIAGNOSIS — F17200 Nicotine dependence, unspecified, uncomplicated: Secondary | ICD-10-CM | POA: Diagnosis not present

## 2017-11-17 DIAGNOSIS — J302 Other seasonal allergic rhinitis: Secondary | ICD-10-CM

## 2017-11-17 DIAGNOSIS — R030 Elevated blood-pressure reading, without diagnosis of hypertension: Secondary | ICD-10-CM

## 2017-11-17 MED ORDER — AMLODIPINE BESYLATE 10 MG PO TABS: 10.0000 mg | ORAL_TABLET | Freq: Every day | ORAL | 1 refills | Status: DC

## 2017-11-17 MED ORDER — OLOPATADINE HCL 0.1 % OP SOLN: 1.0000 [drp] | Freq: Two times a day (BID) | OPHTHALMIC | 12 refills | Status: DC

## 2017-11-17 NOTE — Progress Notes (Signed)
Subjective:    Karen Casey is a 62 y.o. female who presents to [facility] today for HTN:  1.   Hypertension:  New diagnosis for patient.  Long-term problem for this patient.  No adverse effects from medication.  Not checking it regularly.  No HA, CP, dizziness, shortness of breath, palpitations, or LE swelling.  Numerous increased stressors at home.  Her coronary longer works.  She does feel irritable at work.  She states that the hydrochlorothiazide made her feel irritable and "that is not me."  She would like to switch to another medication. BP Readings from Last 3 Encounters:  11/17/17 (!) 168/86  10/27/17 (!) 160/70  10/25/17 (!) 164/100   2.  Seasonal allergies:  New problem for patient.  Mostly her eyes.  Bilateral red eyes.  Also runny.  No runny nose.  No cough.  No difficulty breathing.  No skin rash or changes.  She has not tried anything for relief.  She has had difficulty with insurance and financial struggles and therefore has not tried anything over-the-counter.  No fevers or chills.  No sick contacts.  ROS as above per HPI.     The following portions of the patient's history were reviewed and updated as appropriate: allergies, current medications, past medical history, family and social history, and problem list. Patient is a nonsmoker.    PMH reviewed.  Past Medical History:  Diagnosis Date  . Alcohol abuse   . Allergy   . Anxiety   . Cataract 06/09/2012   Right eye and left eye  . Depression   . GERD (gastroesophageal reflux disease)   . 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 Seizures ([place])    xanax withdrawl- December 2013  . Urinary incontinence 06/09/2012   Past Surgical History:  Procedure Laterality Date  . APPENDECTOMY    . CATARACT EXTRACTION  06/09/2012   Left eye  . left shoulder dislocation  Sept 2011    Medications reviewed. Current Outpatient Medications  Medication Sig Dispense Refill  . ALPRAZolam (XANAX) 1 MG tablet Take 1  mg by mouth 4 (four) times daily as needed.   4  . citalopram (CELEXA) 40 MG tablet Take 40 mg by mouth daily.  11  . esomeprazole (NEXIUM) 40 MG capsule TAKE 1 CAPSULE BY MOUTH EVERY MORNING WITH BREAKFAST 90 capsule 1  . fluticasone (FLONASE) 50 MCG/ACT nasal spray Place 2 sprays into both nostrils daily. 16 g 6  . gabapentin (NEURONTIN) 600 MG tablet Take 1,200 mg by mouth 3 (three) times daily.   11  . hydrochlorothiazide (HYDRODIURIL) 25 MG tablet Take 1 tablet (25 mg total) by mouth daily. 30 tablet 0  . metoCLOPramide (REGLAN) 10 MG tablet Take 0.5 tablets (5 mg total) by mouth every 6 (six) hours as needed for nausea. 5 tablet 0  . Multiple Vitamins-Minerals (CENTRUM SILVER 50+WOMEN PO) Take 1 tablet by mouth daily.    . ondansetron (ZOFRAN) 4 MG tablet Take 1 tablet (4 mg total) by mouth 2 (two) times daily as needed for nausea or vomiting (BID as needed for nausea). 60 tablet 2  . polyvinyl alcohol (LIQUITEARS) 1.4 % ophthalmic solution Place 3 drops into both eyes as needed for dry eyes.    . Probiotic Product (PROBIOTIC PO) Take 1 capsule by mouth daily.    . ranitidine (ZANTAC) 150 MG capsule Take 1 capsule (150 mg total) by mouth 2 (two) times daily. 60 capsule 3  . sucralfate (CARAFATE)  1 g tablet Take 1 tablet (1 g total) by mouth 4 (four) times daily as needed. 120 tablet 2  . terbinafine (LAMISIL) 250 MG tablet Take 1 tablet (250 mg total) by mouth daily. 90 tablet 0  . tiZANidine (ZANAFLEX) 4 MG tablet Take 4-8 mg by mouth at bedtime.   0   No current facility-administered medications for this visit.      Objective:   Physical Exam BP (!) 168/86   Pulse 91   Temp 97.7 F (36.5 C) (Oral)   Ht 5\' 6"  (1.676 m)   Wt 182 lb 6.4 oz (82.7 kg)   SpO2 96%   BMI 29.44 kg/m  Gen:  Patient sitting on exam table, appears stated age in no acute distress Head: Normocephalic atraumatic Eyes: EOMI, PERRL, sclera and conjunctiva non-erythematous Ears:  Canals clear bilaterally.   TMs pearly gray bilaterally without erythema or bulging.   Nose:  Nasal turbinates normal Mouth: Mucosa membranes moist. Tonsils +2, nonenlarged, non-erythematous. Neck: No cervical lymphadenopathy noted Heart:  RRR, no murmurs auscultated. Pulm:  Mild wheezing throughout.  Doesn't clear with coughing.

## 2017-11-17 NOTE — Patient Instructions (Signed)
For your blood pressure: -  take the Amlodipine one pill a day.  Stop the hydrochlorothiazide.   - come back in about a month or so to recheck your blood pressure.    For your allergies and red eyes: -  try the Patanol.   - If insurance won't pay for this you, can use over the counter Visine Allergy.  Use this for a couple of days, then stop for a few days to let your eyes rest.    It was good to see you again today!

## 2017-11-18 ENCOUNTER — Encounter: Payer: Self-pay | Admitting: Family Medicine

## 2017-11-18 DIAGNOSIS — J302 Other seasonal allergic rhinitis: Secondary | ICD-10-CM | POA: Insufficient documentation

## 2017-11-18 NOTE — Assessment & Plan Note (Signed)
Starting her on amlodipine as she had difficulty with hydrochlorothiazide.  Follow-up next month to recheck her blood pressure.

## 2017-11-18 NOTE — Assessment & Plan Note (Signed)
Possibility that her wheezing is related to this but she is also a long-term smoker.  Follow-up in 1 month to reassess. -Patanol today to treat.  If she cannot afford this (she has some concerns that her insurance has run out for this month but will return in July) she can try intermittent use of over-the-counter Visine allergy. -Follow-up in 1 month to reassess for improvement.  No sick contacts and no concerns for URI or other infection.

## 2017-11-18 NOTE — Assessment & Plan Note (Signed)
Not motivated to quit.  She has developed wheezing on exam today which is new for her.  She denies any difficulty breathing or any cough.  She likely will be developing COPD if she continues to smoke.  We will discuss a further visit.

## 2017-11-30 ENCOUNTER — Other Ambulatory Visit: Payer: Self-pay | Admitting: Family Medicine

## 2018-01-12 ENCOUNTER — Other Ambulatory Visit: Payer: Self-pay | Admitting: Family Medicine

## 2018-01-16 ENCOUNTER — Telehealth: Payer: Self-pay | Admitting: Podiatry

## 2018-01-16 NOTE — Telephone Encounter (Signed)
Patient wants to know if Dr Amalia Hailey can see her for callus removal. I explained he no longer does RFC any longer

## 2018-01-23 ENCOUNTER — Ambulatory Visit: Payer: Self-pay

## 2018-01-31 ENCOUNTER — Other Ambulatory Visit: Payer: Self-pay | Admitting: Family Medicine

## 2018-01-31 ENCOUNTER — Other Ambulatory Visit: Payer: Self-pay

## 2018-01-31 DIAGNOSIS — K219 Gastro-esophageal reflux disease without esophagitis: Secondary | ICD-10-CM

## 2018-01-31 MED ORDER — SUCRALFATE 1 G PO TABS: 1.0000 g | ORAL_TABLET | Freq: Four times a day (QID) | ORAL | 2 refills | Status: DC | PRN

## 2018-01-31 MED ORDER — ESOMEPRAZOLE MAGNESIUM 40 MG PO CPDR: DELAYED_RELEASE_CAPSULE | ORAL | 1 refills | Status: DC

## 2018-02-01 ENCOUNTER — Ambulatory Visit: Payer: Medicaid Other | Admitting: Family Medicine

## 2018-02-20 ENCOUNTER — Other Ambulatory Visit: Payer: Self-pay

## 2018-02-20 DIAGNOSIS — K219 Gastro-esophageal reflux disease without esophagitis: Secondary | ICD-10-CM

## 2018-02-20 MED ORDER — AMLODIPINE BESYLATE 10 MG PO TABS: 10.0000 mg | ORAL_TABLET | Freq: Every day | ORAL | 1 refills | Status: DC

## 2018-02-20 MED ORDER — GABAPENTIN 600 MG PO TABS: 1200.0000 mg | ORAL_TABLET | Freq: Three times a day (TID) | ORAL | 2 refills | Status: DC

## 2018-02-20 MED ORDER — SUCRALFATE 1 G PO TABS: 1.0000 g | ORAL_TABLET | Freq: Four times a day (QID) | ORAL | 2 refills | Status: DC | PRN

## 2018-02-20 MED ORDER — ESOMEPRAZOLE MAGNESIUM 40 MG PO CPDR: DELAYED_RELEASE_CAPSULE | ORAL | 1 refills | Status: DC

## 2018-02-20 MED ORDER — RANITIDINE HCL 150 MG PO TABS: ORAL_TABLET | ORAL | 0 refills | Status: DC

## 2018-02-20 MED ORDER — HYDROCHLOROTHIAZIDE 25 MG PO TABS
ORAL_TABLET | ORAL | 0 refills | Status: DC
Start: 2018-02-20 — End: 2018-05-02

## 2018-03-03 ENCOUNTER — Ambulatory Visit: Payer: Self-pay | Admitting: Podiatry

## 2018-03-31 ENCOUNTER — Other Ambulatory Visit: Payer: Self-pay | Admitting: Family Medicine

## 2018-03-31 DIAGNOSIS — K219 Gastro-esophageal reflux disease without esophagitis: Secondary | ICD-10-CM

## 2018-04-24 ENCOUNTER — Other Ambulatory Visit: Payer: Self-pay | Admitting: Family Medicine

## 2018-04-26 ENCOUNTER — Ambulatory Visit: Payer: Self-pay | Admitting: Family Medicine

## 2018-04-26 ENCOUNTER — Telehealth: Payer: Self-pay | Admitting: Family Medicine

## 2018-04-26 ENCOUNTER — Ambulatory Visit: Payer: Medicaid Other | Admitting: Family Medicine

## 2018-04-26 NOTE — Telephone Encounter (Signed)
I have refilled her blood pressure medicine.  Please have her contact her pharmacy to send over any other medications that need to be refilled.

## 2018-04-26 NOTE — Telephone Encounter (Signed)
Pt called to reschedule her appointment since she can not get off. Can we send in refills on medication that need refilling. jw

## 2018-05-01 ENCOUNTER — Other Ambulatory Visit: Payer: Self-pay | Admitting: *Deleted

## 2018-05-01 DIAGNOSIS — K219 Gastro-esophageal reflux disease without esophagitis: Secondary | ICD-10-CM

## 2018-05-01 MED ORDER — RANITIDINE HCL 150 MG PO TABS: ORAL_TABLET | ORAL | 0 refills | Status: DC

## 2018-05-02 ENCOUNTER — Other Ambulatory Visit: Payer: Self-pay | Admitting: Family Medicine

## 2018-05-17 ENCOUNTER — Ambulatory Visit: Payer: Self-pay | Admitting: Gastroenterology

## 2018-05-23 ENCOUNTER — Telehealth: Payer: Self-pay | Admitting: Family Medicine

## 2018-05-23 ENCOUNTER — Ambulatory Visit: Payer: Medicaid Other | Admitting: Family Medicine

## 2018-05-23 MED ORDER — HYDROCHLOROTHIAZIDE 25 MG PO TABS: ORAL_TABLET | ORAL | 1 refills | Status: DC

## 2018-05-23 MED ORDER — ESOMEPRAZOLE MAGNESIUM 40 MG PO CPDR: DELAYED_RELEASE_CAPSULE | ORAL | 1 refills | Status: DC

## 2018-05-23 NOTE — Telephone Encounter (Signed)
Done

## 2018-05-23 NOTE — Telephone Encounter (Signed)
Patient thought appointment today was a different time, and had to reschedule to next week.  She will need a refill on her Nexium and her BP med before then though, if possible.  It goes to [facility] on [address].  Thanks.

## 2018-05-27 ENCOUNTER — Other Ambulatory Visit: Payer: Self-pay | Admitting: Family Medicine

## 2018-05-27 DIAGNOSIS — K219 Gastro-esophageal reflux disease without esophagitis: Secondary | ICD-10-CM

## 2018-05-29 ENCOUNTER — Telehealth: Payer: Self-pay | Admitting: Family Medicine

## 2018-05-29 NOTE — Telephone Encounter (Signed)
Sent electronically 

## 2018-05-29 NOTE — Telephone Encounter (Signed)
Pt would like to have her Zantac refilled.

## 2018-05-31 ENCOUNTER — Ambulatory Visit: Payer: BLUE CROSS/BLUE SHIELD | Admitting: Family Medicine

## 2018-05-31 ENCOUNTER — Other Ambulatory Visit: Payer: Self-pay

## 2018-05-31 ENCOUNTER — Encounter: Payer: Self-pay | Admitting: Family Medicine

## 2018-05-31 VITALS — BP 154/72 | HR 82 | Temp 97.7°F | Ht 66.0 in | Wt 187.4 lb

## 2018-05-31 DIAGNOSIS — I10 Essential (primary) hypertension: Secondary | ICD-10-CM

## 2018-05-31 DIAGNOSIS — R002 Palpitations: Secondary | ICD-10-CM

## 2018-05-31 DIAGNOSIS — K219 Gastro-esophageal reflux disease without esophagitis: Secondary | ICD-10-CM | POA: Diagnosis not present

## 2018-05-31 DIAGNOSIS — K580 Irritable bowel syndrome with diarrhea: Secondary | ICD-10-CM | POA: Diagnosis not present

## 2018-05-31 MED ORDER — CITALOPRAM HYDROBROMIDE 40 MG PO TABS: 40.0000 mg | ORAL_TABLET | Freq: Every day | ORAL | 3 refills | Status: DC

## 2018-05-31 MED ORDER — FLUTICASONE PROPIONATE 50 MCG/ACT NA SUSP: 2.0000 | Freq: Every day | NASAL | 6 refills | Status: DC

## 2018-05-31 MED ORDER — BACLOFEN 10 MG PO TABS: 10.0000 mg | ORAL_TABLET | Freq: Three times a day (TID) | ORAL | 0 refills | Status: DC

## 2018-05-31 MED ORDER — SUCRALFATE 1 G PO TABS: 1.0000 g | ORAL_TABLET | Freq: Four times a day (QID) | ORAL | 2 refills | Status: DC | PRN

## 2018-05-31 MED ORDER — AMLODIPINE BESYLATE 10 MG PO TABS: 10.0000 mg | ORAL_TABLET | Freq: Every day | ORAL | 1 refills | Status: DC

## 2018-05-31 MED ORDER — FAMOTIDINE 40 MG PO TABS: 40.0000 mg | ORAL_TABLET | Freq: Every day | ORAL | 2 refills | Status: DC

## 2018-05-31 MED ORDER — OLOPATADINE HCL 0.1 % OP SOLN: 1.0000 [drp] | Freq: Two times a day (BID) | OPHTHALMIC | 12 refills | Status: DC

## 2018-05-31 NOTE — Progress Notes (Signed)
Subjective:    Karen Casey is a 62 y.o. female who presents to [facility] today for med refills for GERD:  1.  GERD:  With concomitant nausea.  Longstanding problem for patient.  On pepcid and Nexium and has been for year.  Slow weight gain without weight loss.  No stomach masses that she is noted.  She follows intermittently with gastroenterology.  EGD has showed persistent gastritis along with occasional esophagitis.  She did have episode of diarrhea yesterday lasted most of the day but has not had any today.  2.  Concern for heart:  Husband with recent heart attack, described as "massive."  She is worried about her heart health.  She reports that she's had palpitations, but no chest pain. Palpitations present for at least months if not longer.  Hasn't wanted to bring this up before.  Has ongoing nausea "all my life" not associated with palpitations.  Has baseline anxiety and "general nervousness."  This hasn't worsened any recently.  No SOB.  Current everyday smoker.    3.  HTN:  Long-term problem for this patient.  No adverse effects from medication.  On Norvasc + HCTZ.  Out of her norvasc.  Not checking it regularly.  No HA, CP, dizziness, shortness of breath, palpitations, or LE swelling.   BP Readings from Last 3 Encounters:  05/31/18 (!) 156/82  11/17/17 (!) 168/86  10/27/17 (!) 160/70     ROS as above per HPI.   The following portions of the patient's history were reviewed and updated as appropriate: allergies, current medications, past medical history, family and social history, and problem list. Patient is a nonsmoker.    PMH reviewed.  Past Medical History:  Diagnosis Date  . Alcohol abuse   . Allergy   . Anxiety   . Cataract 06/09/2012   Right eye and left eye  . Depression   . GERD (gastroesophageal reflux disease)   . 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 Seizures ([place])    xanax withdrawl- December 2013  . Urinary incontinence 06/09/2012   Past  Surgical History:  Procedure Laterality Date  . APPENDECTOMY    . CATARACT EXTRACTION  06/09/2012   Left eye  . left shoulder dislocation  Sept 2011    Medications reviewed. Current Outpatient Medications  Medication Sig Dispense Refill  . ALPRAZolam (XANAX) 1 MG tablet Take 1 mg by mouth 4 (four) times daily as needed.   4  . amLODipine (NORVASC) 10 MG tablet Take 1 tablet (10 mg total) by mouth daily. 90 tablet 1  . citalopram (CELEXA) 40 MG tablet Take 40 mg by mouth daily.  11  . esomeprazole (NEXIUM) 40 MG capsule TAKE 1 CAPSULE BY MOUTH EVERY MORNING WITH BREAKFAST 90 capsule 1  . fluticasone (FLONASE) 50 MCG/ACT nasal spray Place 2 sprays into both nostrils daily. 16 g 6  . gabapentin (NEURONTIN) 600 MG tablet Take 2 tablets (1,200 mg total) by mouth 3 (three) times daily. 180 tablet 2  . hydrochlorothiazide (HYDRODIURIL) 25 MG tablet TAKE 1 TABLET(25 MG) BY MOUTH DAILY 30 tablet 0  . hydrochlorothiazide (HYDRODIURIL) 25 MG tablet TAKE 1 TABLET(25 MG) BY MOUTH DAILY 90 tablet 1  . metoCLOPramide (REGLAN) 10 MG tablet Take 0.5 tablets (5 mg total) by mouth every 6 (six) hours as needed for nausea. 5 tablet 0  . Multiple Vitamins-Minerals (CENTRUM SILVER 50+WOMEN PO) Take 1 tablet by mouth daily.    Marland Kitchen olopatadine (PATANOL) 0.1 %  ophthalmic solution Place 1 drop into both eyes 2 (two) times daily. 5 mL 12  . ondansetron (ZOFRAN) 4 MG tablet Take 1 tablet (4 mg total) by mouth 2 (two) times daily as needed for nausea or vomiting (BID as needed for nausea). 60 tablet 2  . polyvinyl alcohol (LIQUITEARS) 1.4 % ophthalmic solution Place 3 drops into both eyes as needed for dry eyes.    . Probiotic Product (PROBIOTIC PO) Take 1 capsule by mouth daily.    . ranitidine (ZANTAC) 150 MG tablet TAKE 1 TABLET BY MOUTH DAILY AS NEEDED FOR HEARTBURN 60 tablet 0  . sucralfate (CARAFATE) 1 g tablet Take 1 tablet (1 g total) by mouth 4 (four) times daily as needed. 120 tablet 2  . terbinafine (LAMISIL)  250 MG tablet Take 1 tablet (250 mg total) by mouth daily. 90 tablet 0  . tiZANidine (ZANAFLEX) 4 MG tablet Take 4-8 mg by mouth at bedtime.   0   No current facility-administered medications for this visit.      Objective:   Physical Exam BP (!) 156/82   Pulse 82   Temp 97.7 F (36.5 C) (Oral)   Ht 5\' 6"  (1.676 m)   Wt 187 lb 6.4 oz (85 kg)   SpO2 95%   BMI 30.25 kg/m  Gen:  Alert, cooperative patient who appears stated age in no acute distress.  Vital signs reviewed. HEENT: EOMI,  MMM Cardiac:  Regular rate and rhythm currently despite feelings of palpitations.  Pulm:  Mild wheezing at bases but otherwise clear.   Abd:  Soft/nondistended.   Exts: Non edematous BL  LE, warm and well perfused.   No results found for this or any previous visit (from the past 72 hour(s)).

## 2018-05-31 NOTE — Patient Instructions (Addendum)
It was good to see you again today.  I have refilled all of your medicines.  I want you to start to taper off the gabapentin.  Start taking half a pill in the morning and 1 pill at night for the next 5 days.  Then take half a pill in the morning half pill in the evening for the next 5 days.  Then just 1/2 pill at night for 5 days.  Then completely stop.  If you start having any strange symptoms such as numbness and tingling or muscle spasms worse than usual with this medicine call let me know and we can make some changes.  I am also going to refer you to a cardiologist.  They will call you to set up an appointment.  Have a good Christmas and New Year's!

## 2018-06-02 ENCOUNTER — Other Ambulatory Visit: Payer: Self-pay | Admitting: Family Medicine

## 2018-06-02 ENCOUNTER — Encounter: Payer: Self-pay | Admitting: Family Medicine

## 2018-06-02 DIAGNOSIS — I10 Essential (primary) hypertension: Secondary | ICD-10-CM | POA: Insufficient documentation

## 2018-06-02 DIAGNOSIS — R002 Palpitations: Secondary | ICD-10-CM | POA: Insufficient documentation

## 2018-06-02 NOTE — Assessment & Plan Note (Signed)
Patient adamantly refused EKG today.  Was more open to referral for cardiology. -When asked reason for deferring EKG she reported not having of time and not wanting to know if there is an issue currently. -After discussing with her further she has been having palpitations for at least a year if not longer.  No syncopal episodes.  No presyncope.  I believe we can defer EKG to cardiology at her request.   -No chest pain.  No shortness of breath.  Her chronic abdominal pain has been going on since she was at least a teenager.

## 2018-06-02 NOTE — Assessment & Plan Note (Signed)
As above for IBS.

## 2018-06-02 NOTE — Assessment & Plan Note (Signed)
Ongoing symptoms. -Has been followed by GI in the past. -She is not currently taking sucralfate and states that overall her GERD and irritable bowel syndrome are better than usual.  No longer daily vomiting which she was having for years.  She has not vomited for about a year. -Switching from ranitidine due to in availability due to recall to Pepcid today.

## 2018-06-02 NOTE — Assessment & Plan Note (Signed)
Difficulty controlling the medicine.  In the past she has been on both amlodipine and hydrochlorothiazide.  No trouble with either.  We can restart both of these medications today.  Currently she is just taking her HCTZ.

## 2018-06-07 ENCOUNTER — Other Ambulatory Visit: Payer: Self-pay | Admitting: Family Medicine

## 2018-06-12 ENCOUNTER — Telehealth: Payer: Self-pay | Admitting: Family Medicine

## 2018-06-12 ENCOUNTER — Telehealth: Payer: Self-pay

## 2018-06-12 MED ORDER — SUCRALFATE 1 G PO TABS: 1.0000 g | ORAL_TABLET | Freq: Four times a day (QID) | ORAL | 0 refills | Status: DC | PRN

## 2018-06-12 NOTE — Telephone Encounter (Signed)
Covering for Dr. Mingo Amber. Called patient to discuss. No answer. Will try again later.  Leeanne Rio, MD

## 2018-06-12 NOTE — Telephone Encounter (Signed)
Patient left message on nurse line that her Zantac had been changed to Pepcid due to recall. Patient states the Pepcid is not helping her at all with her symptoms of reflux and nausea. Asks that Pepcid be changed to another medication.  Call back is [phone]  Danley Danker, RN [facility])

## 2018-06-12 NOTE — Telephone Encounter (Signed)
See separate phone note. Sharlisa Hollifield J Carvin Almas, MD  

## 2018-06-12 NOTE — Telephone Encounter (Signed)
Pt called and said she need to get her medication changed because is not working. Pt said to call her back. ad

## 2018-06-12 NOTE — Telephone Encounter (Signed)
Called patient again and was able to reach her. Patient not taking carafate. Rx sent in for her to try this. Advised to follow up with PCP in January. Patient appreciative.  Leeanne Rio, MD

## 2018-07-12 ENCOUNTER — Other Ambulatory Visit: Payer: Self-pay | Admitting: Family Medicine

## 2018-08-04 ENCOUNTER — Other Ambulatory Visit: Payer: Self-pay | Admitting: Family Medicine

## 2018-09-06 ENCOUNTER — Other Ambulatory Visit: Payer: Self-pay | Admitting: Family Medicine

## 2018-10-29 ENCOUNTER — Other Ambulatory Visit: Payer: Self-pay | Admitting: Family Medicine

## 2018-11-28 ENCOUNTER — Other Ambulatory Visit: Payer: Self-pay | Admitting: *Deleted

## 2018-11-28 MED ORDER — HYDROCHLOROTHIAZIDE 25 MG PO TABS: 25.0000 mg | ORAL_TABLET | Freq: Every day | ORAL | 0 refills | Status: DC

## 2018-12-25 ENCOUNTER — Other Ambulatory Visit: Payer: Self-pay | Admitting: *Deleted

## 2018-12-26 MED ORDER — HYDROCHLOROTHIAZIDE 25 MG PO TABS: 25.0000 mg | ORAL_TABLET | Freq: Every day | ORAL | 0 refills | Status: DC

## 2018-12-26 NOTE — Telephone Encounter (Signed)
Red team, please ask patient to schedule visit with me so she can meet new PCP. She is also past due for bloodwork to monitor this medication.  Thanks Leeanne Rio, MD

## 2018-12-27 NOTE — Telephone Encounter (Signed)
LMOVM for pt to call back and make an appt.Deseree Blount, CMA  

## 2019-01-22 ENCOUNTER — Other Ambulatory Visit: Payer: Self-pay | Admitting: Family Medicine

## 2019-01-22 ENCOUNTER — Telehealth: Payer: Self-pay

## 2019-01-22 NOTE — Telephone Encounter (Signed)
Spoke to patient and she had to get another PCP because her insurance does not cover her visits here.  Karen Casey, [place]

## 2019-01-22 NOTE — Telephone Encounter (Signed)
Please ask patient to schedule follow up visit. Leeanne Rio, MD

## 2019-01-22 NOTE — Telephone Encounter (Signed)
Attempted to call pt, no answer and mailbox full. Will try again later. Dilara Navarrete Kennon Holter, CMA

## 2019-02-20 ENCOUNTER — Ambulatory Visit: Payer: BLUE CROSS/BLUE SHIELD | Admitting: Family Medicine

## 2019-02-23 ENCOUNTER — Ambulatory Visit: Payer: BLUE CROSS/BLUE SHIELD | Admitting: Physician Assistant

## 2019-02-23 ENCOUNTER — Other Ambulatory Visit: Payer: Self-pay | Admitting: Family Medicine

## 2019-02-25 NOTE — Telephone Encounter (Signed)
Red team, please clarify with patient whether she plans to continue care in our office. I saw a note from [place] on 8/10 saying that patient had to get a new PCP, but I see she has an appointment scheduled with me upcoming. Just want to know if she plans to continue coming here before deciding on this refill.  Thanks! Leeanne Rio, MD

## 2019-02-28 NOTE — Telephone Encounter (Signed)
Called patient to verify if she still wants to continue care with [facility].  No answer and voice mail was left to call office to confirm appointment.  Ozella Almond, [place]

## 2019-03-05 ENCOUNTER — Encounter: Payer: Medicaid Other | Admitting: Family Medicine

## 2019-03-13 ENCOUNTER — Other Ambulatory Visit: Payer: Self-pay | Admitting: Family Medicine

## 2019-03-20 ENCOUNTER — Encounter: Payer: Medicaid Other | Admitting: Family Medicine

## 2019-03-21 ENCOUNTER — Other Ambulatory Visit: Payer: Self-pay

## 2019-03-22 MED ORDER — AMLODIPINE BESYLATE 10 MG PO TABS: 10.0000 mg | ORAL_TABLET | Freq: Every day | ORAL | 0 refills | Status: DC

## 2019-03-27 ENCOUNTER — Encounter: Payer: Medicaid Other | Admitting: Family Medicine

## 2019-04-10 ENCOUNTER — Other Ambulatory Visit: Payer: Self-pay | Admitting: *Deleted

## 2019-04-10 ENCOUNTER — Ambulatory Visit: Payer: Medicaid Other | Admitting: Family Medicine

## 2019-04-12 ENCOUNTER — Telehealth: Payer: Self-pay

## 2019-04-12 NOTE — Telephone Encounter (Signed)
I will not refill any medications for this patient until she comes in and is seen. She has repeatedly scheduled appts after asking for refills, then either cancels day of appointment or no shows.  She previously told Darrick Penna that she had to find a new PCP due to her insurance now being out of network with [place]. I am suspicious that she is scheduling appts with no intention of coming just in order to have medications refilled.  She must keep her appointment in order to receive any refills. Please inform patient.  Leeanne Rio, MD

## 2019-04-12 NOTE — Telephone Encounter (Signed)
Called and informed patient that she no longer would be able to get refills on medications until she keeps her appointments.  Patient states that she now has Medicaid and will be coming to her appointment on 04/17/2019 at 0910.  Ozella Almond, [place]

## 2019-04-16 ENCOUNTER — Other Ambulatory Visit: Payer: Self-pay | Admitting: Family Medicine

## 2019-04-17 ENCOUNTER — Encounter: Payer: Medicaid Other | Admitting: Family Medicine

## 2019-04-24 ENCOUNTER — Other Ambulatory Visit: Payer: Self-pay

## 2019-04-24 ENCOUNTER — Other Ambulatory Visit: Payer: Self-pay | Admitting: Family Medicine

## 2019-04-24 NOTE — Telephone Encounter (Signed)
Called patient to schedule an appointment as we continue to receive refill request.  There was no answer and a voice mail was left to call office to schedule an appointment so that she could receive her meds.   Ozella Almond, [place]

## 2019-04-25 NOTE — Telephone Encounter (Signed)
Which medications does patient need refilled? I am willing to send in enough to last her until the appointment but will absolutely not do this again until she is seen.  Leeanne Rio, MD

## 2019-04-25 NOTE — Telephone Encounter (Signed)
Patient returning CMA's call - told patient she would have to see PCP before getting anymore refills. Offered patient next available appointment for [facility], 05/17/2019 @ 10:50. Patient states she only has a couple of pills left of each of her medications. She asked 'well how am I supposed to get my meds until 12/3?' Told patient it looks like she's been notified about needing to see PCP a few times now and has no showed or cancelled many appointments to be able to get these refills. I also told patient it's important to keep this appointment on 12/3.   Patient would like to know if she can get refills until her appointment?

## 2019-04-26 MED ORDER — FLUTICASONE PROPIONATE 50 MCG/ACT NA SUSP: 2.0000 | Freq: Every day | NASAL | 0 refills | Status: DC

## 2019-04-26 MED ORDER — AMLODIPINE BESYLATE 10 MG PO TABS: 10.0000 mg | ORAL_TABLET | Freq: Every day | ORAL | 0 refills | Status: DC

## 2019-04-26 MED ORDER — OLOPATADINE HCL 0.1 % OP SOLN: 1.0000 [drp] | Freq: Two times a day (BID) | OPHTHALMIC | 0 refills | Status: DC

## 2019-04-26 MED ORDER — FAMOTIDINE 40 MG PO TABS: ORAL_TABLET | ORAL | 0 refills | Status: DC

## 2019-04-26 MED ORDER — HYDROCHLOROTHIAZIDE 25 MG PO TABS: ORAL_TABLET | ORAL | 0 refills | Status: DC

## 2019-04-26 MED ORDER — ESOMEPRAZOLE MAGNESIUM 40 MG PO CPDR: DELAYED_RELEASE_CAPSULE | ORAL | 0 refills | Status: DC

## 2019-04-26 NOTE — Telephone Encounter (Signed)
Pt informed and agreeable.  Reminded of appt on 05/17/19. Christen Bame, CMA

## 2019-04-26 NOTE — Addendum Note (Signed)
Addended by: Junious Dresser on: 04/26/2019 09:18 AM   Modules accepted: Orders

## 2019-04-26 NOTE — Telephone Encounter (Signed)
Attempted to reach patient by phone to personally inform her that I will send in 30 days of her medications but will NOT do this again until she is seen. No answer.  Red team, please reiterate to patient that this is absolutely the last time I will do it.   Leeanne Rio, MD

## 2019-04-26 NOTE — Telephone Encounter (Signed)
meds that pt needs are attached. Karen Casey, CMA

## 2019-05-17 ENCOUNTER — Ambulatory Visit: Payer: Medicaid Other | Admitting: Family Medicine

## 2019-05-23 ENCOUNTER — Other Ambulatory Visit: Payer: Self-pay | Admitting: Family Medicine

## 2019-05-23 NOTE — Telephone Encounter (Signed)
Patient has repeatedly no showed to many appointments. Will not refill any medications until she attends an appointment.

## 2019-05-25 ENCOUNTER — Ambulatory Visit: Payer: Medicaid Other | Admitting: Family Medicine

## 2019-05-25 ENCOUNTER — Telehealth: Payer: Self-pay | Admitting: Family Medicine

## 2019-05-25 NOTE — Telephone Encounter (Signed)
Thanks. Will not refill until she attends an appointment.  Leeanne Rio, MD

## 2019-05-25 NOTE — Progress Notes (Deleted)
Subjective:  CC -- Annual Physical; With complaints of ***  Pt reports she ***   Cardiovascular: - Risk as of ***(date): *** (assessment every 3-5 years) - Dx Hypertension: {YES/NO/WILD [id number]  - Dx Hyperlipidemia: {YES/NO/WILD [id number] (once age 61-21; high risk >35yo, low risk >45)*** - Dx Obesity: {YES/NO/WILD CARDS:18581} (Class I BMI <34.9, Class II <39.9, Class III < 49.9)*** - Physical Activity: {YES/NO/WILD [id number]  - Diabetes: {YES/NO/WILD [id number] (age 19-70 w/ BMI >25, or HTN, or HLD) *** (A1c >6.5, or classic Sxs w/ random CBG >200, or FBG >126 (fasting >8hr))***  Cancer: Colorectal >> Colonoscopy: {YES/NO/WILD [id number] (Risk factors?, W/o risk factors > 50yo)*** Lung >> Tobacco Use: {YES/NO/WILD [id number] (age 12-74 w/ >7yr/pack/yr Hx, unless quit >72yr ago) ***  - If so, previous Low-Dose CT screen: {YES/NO/WILD [id number]  Breast >> Mammogram: {YES/NO/WILD CARDS:18581} (>40yo to be discussed; Q90yrs)*** Cervical/Endometrial >>  - Postmenopausal: {YES/NO/WILD [id number]  - Vaginal Bleeding: {YES/NO/WILD [id number] - Pap Smear: {YES/NO/WILD [id number]:3112392   - Previous Abnormal Pap: {YES/NO/WILD CARDS:18581} (21-29yo Q64yrs; >30yo Q52yr w/ neg HPV)*** Skin >> Suspicious lesions: {YES/NO/WILD [id number]   Social: Alcohol Use: {YES/NO/WILD [id number]  Tobacco Use: {YES/NO/WILD [id number]   - Interested in Quitting: {YES/NO/WILD [id number]  Other Drugs: {YES/NO/WILD [id number]:3112392  Risky Sexual Behavior: {YES/NO/WILD CARDS:18581}  - Chlamydia: <25yo, or >25yo and incr risk - Gonorrhea: sexually active <25yo, or at increased risk - Syphilis: If at increased risk - HIV: All individuals (15-18yo once, then annually if risks are high) >> should be checked anytime STDs are checked. - Hep C: once if born in [place] between 1945-1965; or at increased risk - Hep B: If at increased risk*** Depression: {YES/NO/WILD CARDS:18581}   - PHQ9 score:    Support and Life at Home: {YES/NO/WILD [id number]   Other: Osteoporosis: {YES/NO/WILD CARDS:18581} (postmenopausal women <65yo with risk factors -- do BMD screen)*** Zoster Vaccine: {YES/NO/WILD [id number]:3112392 (those >50yo)*** Flu Vaccine: {YES/NO/WILD [id number]  Pneumonia Vaccine: {YES/NO/WILD [id number] (those w/ risk factors) - Both: Immunocompromised, cochlear implant, CSF leak, asplenic, sickle cell, CKD - PPSV-23 only: Heart dz, lung disease, DM, tobacco abuse, alcoholism, cirrhosis/liver disease.***   ROS-  Past Medical History Patient Active Problem List   Diagnosis Date Noted  . HTN (hypertension) 06/02/2018  . Palpitations 06/02/2018  . Seasonal allergies 11/18/2017  . Foot pain, bilateral 08/23/2017  . Onychomycosis 01/07/2016  . Allergic conjunctivitis 12/22/2015  . Urinary urgency 12/26/2013  . Broken teeth 12/20/2013  . Back pain 02/14/2013  . Anxiety disorder 06/12/2012  . Alcohol addiction ([place]) 05/28/2012  . Irritable bowel syndrome 05/10/2012  . Smoker 02/17/2012  . GERD (gastroesophageal reflux disease) 02/04/2012  . Depression 02/04/2012  . Preventative health care 02/01/2012    Medications- reviewed and updated Current Outpatient Medications  Medication Sig Dispense Refill  . ALPRAZolam (XANAX) 1 MG tablet Take 1 mg by mouth 4 (four) times daily as needed.   4  . amLODipine (NORVASC) 10 MG tablet Take 1 tablet (10 mg total) by mouth daily. 30 tablet 0  . baclofen (LIORESAL) 10 MG tablet TAKE 1 TABLET(10 MG) BY MOUTH THREE TIMES DAILY 30 tablet 3  . citalopram (CELEXA) 40 MG tablet Take 1 tablet (40 mg total) by mouth daily. 90 tablet 3  . esomeprazole (NEXIUM) 40 MG capsule TAKE 1 CAPSULE BY MOUTH DAILY 30 capsule 0  . famotidine (PEPCID) 40 MG tablet TAKE 1 TABLET(40 MG) BY MOUTH DAILY 30 tablet 0  . fluticasone (FLONASE) 50 MCG/ACT nasal spray Place 2 sprays into both  nostrils daily. 16 g 0  . gabapentin (NEURONTIN) 600 MG tablet Take 2  tablets (1,200 mg total) by mouth 3 (three) times daily. 180 tablet 2  . hydrochlorothiazide (HYDRODIURIL) 25 MG tablet TAKE 1 TABLET(25 MG) BY MOUTH DAILY 30 tablet 0  . Multiple Vitamins-Minerals (CENTRUM SILVER 50+WOMEN PO) Take 1 tablet by mouth daily.    Marland Kitchen olopatadine (PATANOL) 0.1 % ophthalmic solution Place 1 drop into both eyes 2 (two) times daily. 5 mL 0  . polyvinyl alcohol (LIQUITEARS) 1.4 % ophthalmic solution Place 3 drops into both eyes as needed for dry eyes.    . Probiotic Product (PROBIOTIC PO) Take 1 capsule by mouth daily.    . sucralfate (CARAFATE) 1 g tablet TAKE 1 TABLET(1 GRAM) BY MOUTH FOUR TIMES DAILY AS NEEDED 120 tablet 0   No current facility-administered medications for this visit.    Objective: There were no vitals taken for this visit. Gen: NAD, alert, cooperative with exam*** HEENT: NCAT, EOMI, PERRL CV: RRR, good S1/S2, no murmur Resp: CTABL, no wheezes, non-labored Abd: Soft, Non Tender, Non Distended, BS present, no guarding or organomegaly Genital Exam: {genitourinary exam:311642::"not done"} Ext: No edema, warm Neuro: Alert and oriented, No gross deficits   Assessment/Plan:  No problem-specific Assessment & Plan notes found for this encounter.   No orders of the defined types were placed in this encounter.   No orders of the defined types were placed in this encounter.    Everrett Coombe, MD,MS,  PGY1 05/25/2019 8:19 AM

## 2019-05-25 NOTE — Telephone Encounter (Signed)
Patient called 05-25-2019 @ 8:37 am to cancel her appointment for 05-25-2019 @ 8:30. Patient says "I have a flat tire." Offered McIntyre's next available appointment,06-04-2019 @ 9:50. Patient says, "I am out of my meds, what am I supposed to do?" Told patient she has no showed/cancelled many appointments in the past 3-4 months and she has already been told multiple times she must keep her appointments in order to get her meds refilled. Patient seemed to be understanding of this and made her appointment for the 21st.

## 2019-05-28 NOTE — Telephone Encounter (Signed)
Patient calls nurse line requesting refills on her blood pressure medication. I informed patient of below.

## 2019-06-01 ENCOUNTER — Other Ambulatory Visit: Payer: Self-pay | Admitting: Family Medicine

## 2019-06-04 ENCOUNTER — Ambulatory Visit: Payer: Medicaid Other | Admitting: Family Medicine

## 2019-06-16 ENCOUNTER — Other Ambulatory Visit: Payer: Self-pay | Admitting: Family Medicine

## 2019-06-22 ENCOUNTER — Other Ambulatory Visit: Payer: Self-pay | Admitting: *Deleted

## 2019-06-30 ENCOUNTER — Other Ambulatory Visit: Payer: Self-pay | Admitting: Family Medicine

## 2019-07-02 NOTE — Telephone Encounter (Signed)
Pt states her insurance is through [facility] [facility] now. Asked patient if she had found new primary office and pt states that she has not done so yet, however, she "really needs her refills". Informed patient that it has been over one year since she has been seen in office and that she will need to be seen by provider before any further refills would be prescribed. Patient states that she will call back later.   Talbot Grumbling, RN

## 2019-07-16 ENCOUNTER — Other Ambulatory Visit: Payer: Self-pay | Admitting: Family Medicine

## 2019-07-27 ENCOUNTER — Other Ambulatory Visit: Payer: Self-pay

## 2019-07-27 ENCOUNTER — Encounter (HOSPITAL_COMMUNITY): Payer: Self-pay | Admitting: Emergency Medicine

## 2019-07-27 ENCOUNTER — Emergency Department (HOSPITAL_COMMUNITY): Payer: No Typology Code available for payment source

## 2019-07-27 ENCOUNTER — Emergency Department (HOSPITAL_COMMUNITY)
Admission: EM | Admit: 2019-07-27 | Discharge: 2019-07-27 | Disposition: A | Discharge status: Home / Self Care | Payer: No Typology Code available for payment source | Attending: Emergency Medicine | Admitting: Emergency Medicine

## 2019-07-27 DIAGNOSIS — Y929 Unspecified place or not applicable: Secondary | ICD-10-CM | POA: Insufficient documentation

## 2019-07-27 DIAGNOSIS — M629 Disorder of muscle, unspecified: Secondary | ICD-10-CM | POA: Insufficient documentation

## 2019-07-27 DIAGNOSIS — F17210 Nicotine dependence, cigarettes, uncomplicated: Secondary | ICD-10-CM | POA: Diagnosis not present

## 2019-07-27 DIAGNOSIS — S299XXA Unspecified injury of thorax, initial encounter: Secondary | ICD-10-CM | POA: Diagnosis present

## 2019-07-27 DIAGNOSIS — Y9389 Activity, other specified: Secondary | ICD-10-CM | POA: Diagnosis not present

## 2019-07-27 DIAGNOSIS — Z79899 Other long term (current) drug therapy: Secondary | ICD-10-CM | POA: Diagnosis not present

## 2019-07-27 DIAGNOSIS — I10 Essential (primary) hypertension: Secondary | ICD-10-CM | POA: Insufficient documentation

## 2019-07-27 DIAGNOSIS — M6289 Other specified disorders of muscle: Secondary | ICD-10-CM

## 2019-07-27 DIAGNOSIS — Y999 Unspecified external cause status: Secondary | ICD-10-CM | POA: Diagnosis not present

## 2019-07-27 DIAGNOSIS — S20211A Contusion of right front wall of thorax, initial encounter: Secondary | ICD-10-CM | POA: Insufficient documentation

## 2019-07-27 MED ORDER — IBUPROFEN 600 MG PO TABS: 600.0000 mg | ORAL_TABLET | Freq: Three times a day (TID) | ORAL | 0 refills | Status: DC | PRN
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
## 2019-07-27 NOTE — Discharge Instructions (Addendum)
Take ibuprofen 3 times a day with meals.  Do not take other anti-inflammatories at the same time (Advil, Motrin, naproxen, Aleve). You may supplement with Tylenol if you need further pain control. Continue taking your Xanax 3 times a day.  This will help follow-up as a muscle relaxer. Use muscle creams/patches such as salonpas, icy hot, BenGay, Biofreeze. Use ice packs or heating pads if this helps control your pain. Do gentle stretches to help decrease muscle stiffness and soreness. You will likely have continued muscle stiffness and soreness over the next couple days.  Follow-up with primary care in 1 week if your symptoms are not improving. Return to the emergency room if you develop vision changes, vomiting, slurred speech, numbness, loss of bowel or bladder control, or any new or worsening symptoms.

## 2019-08-15 ENCOUNTER — Other Ambulatory Visit: Payer: Self-pay | Admitting: Family Medicine

## 2019-10-03 ENCOUNTER — Ambulatory Visit: Payer: Self-pay | Admitting: Nurse Practitioner

## 2019-12-19 ENCOUNTER — Other Ambulatory Visit: Payer: Self-pay | Admitting: Family Medicine

## 2019-12-26 ENCOUNTER — Other Ambulatory Visit: Payer: Self-pay | Admitting: *Deleted

## 2019-12-31 ENCOUNTER — Ambulatory Visit: Payer: Self-pay | Admitting: Nurse Practitioner

## 2020-01-01 NOTE — Telephone Encounter (Signed)
Attempted to call pt ad phone said she is not available. Unable to leave vm. Will try again later. Araya Roel Kennon Holter, CMA

## 2020-01-04 ENCOUNTER — Ambulatory Visit: Payer: Self-pay | Admitting: Nurse Practitioner

## 2020-01-08 NOTE — Telephone Encounter (Signed)
Attempted to call patient with no answer and no ability to leave a voice message.  Karen Casey, [place]

## 2020-01-10 NOTE — Telephone Encounter (Signed)
MAilbox is full.  Will await callback.  Christen Bame, CMA

## 2020-01-13 ENCOUNTER — Other Ambulatory Visit: Payer: Self-pay | Admitting: Family Medicine

## 2020-02-09 ENCOUNTER — Other Ambulatory Visit: Payer: Self-pay | Admitting: Family Medicine

## 2020-02-12 DIAGNOSIS — M79676 Pain in unspecified toe(s): Secondary | ICD-10-CM

## 2020-02-13 NOTE — Telephone Encounter (Signed)
Patient assures me that she will be in for appointment as long as she can be billed for visit.  Informed patient that I can not guarantee this but will put a note in chart.  Karen Casey, [place]

## 2020-02-19 ENCOUNTER — Ambulatory Visit: Payer: Medicaid Other

## 2020-02-25 ENCOUNTER — Ambulatory Visit: Payer: Medicaid Other | Admitting: Family Medicine

## 2020-02-25 ENCOUNTER — Telehealth: Payer: Self-pay

## 2020-02-25 NOTE — Telephone Encounter (Signed)
Patient calls nurse line regarding cancelling appointment due to having to urgently take her dog to the vet for congestive heart failure. Patient is requesting refills on BP medications. Informed patient that she has to be seen in order to receive refills, as she has not been seen in our office since 2019.  Rescheduled patient with Dr. Volanda Napoleon for Friday Morning.   Talbot Grumbling, RN

## 2020-02-29 ENCOUNTER — Ambulatory Visit: Payer: Medicaid Other | Admitting: Family Medicine

## 2020-03-04 ENCOUNTER — Other Ambulatory Visit: Payer: Self-pay

## 2020-03-04 ENCOUNTER — Telehealth: Payer: Self-pay

## 2020-03-04 ENCOUNTER — Ambulatory Visit (INDEPENDENT_AMBULATORY_CARE_PROVIDER_SITE_OTHER): Payer: Self-pay | Admitting: Student in an Organized Health Care Education/Training Program

## 2020-03-04 VITALS — BP 145/80 | HR 126 | Ht 65.0 in | Wt 191.2 lb

## 2020-03-04 DIAGNOSIS — M2040 Other hammer toe(s) (acquired), unspecified foot: Secondary | ICD-10-CM

## 2020-03-04 DIAGNOSIS — F329 Major depressive disorder, single episode, unspecified: Secondary | ICD-10-CM

## 2020-03-04 DIAGNOSIS — F32A Depression, unspecified: Secondary | ICD-10-CM

## 2020-03-04 DIAGNOSIS — Z720 Tobacco use: Secondary | ICD-10-CM

## 2020-03-04 DIAGNOSIS — K219 Gastro-esophageal reflux disease without esophagitis: Secondary | ICD-10-CM

## 2020-03-04 DIAGNOSIS — Z1231 Encounter for screening mammogram for malignant neoplasm of breast: Secondary | ICD-10-CM

## 2020-03-04 DIAGNOSIS — I10 Essential (primary) hypertension: Secondary | ICD-10-CM

## 2020-03-04 DIAGNOSIS — Z9119 Patient's noncompliance with other medical treatment and regimen: Secondary | ICD-10-CM

## 2020-03-04 DIAGNOSIS — Z91199 Patient's noncompliance with other medical treatment and regimen due to unspecified reason: Secondary | ICD-10-CM

## 2020-03-04 MED ORDER — PANTOPRAZOLE SODIUM 40 MG PO TBEC
40.0000 mg | DELAYED_RELEASE_TABLET | Freq: Every day | ORAL | 3 refills | Status: DC
Start: 2020-03-04 — End: 2020-06-20

## 2020-03-04 MED ORDER — POLYVINYL ALCOHOL 1.4 % OP SOLN: 3.0000 [drp] | OPHTHALMIC | 1 refills | Status: DC | PRN

## 2020-03-04 NOTE — Progress Notes (Signed)
SUBJECTIVE:   CHIEF COMPLAINT / HPI: medication refill, HTN  HTN- more than a year since taken medications. Reports measurements on home cuff up to 024 systolics. 130/80 in office today, checked twice by provider. Patient is asymptomatic and feels very strongly that she needs her HTN medications refilled. She just feels better when she takes them. Previous medications: Amlodipine 10mg , HCTZ 25mg   IBS/GERD/nausea-  Extensive h/o since 64yo. Has history of diverticulitis. Colonoscopy and EGD in 2014 at [facility]. Exacerbated by spicy foods. Used to take nexium which helped but no longer makes a difference. Reports epigastric burning pain that sometimes radiates up throat. Denies blood in stool or vomit. No weight loss per record review. Vomits once every 3-4 weeks approximately but has regular nausea. Occurs daily. Last took PPI yesterday. Neg for h. Pylori in 2014. Previous medications: Esomeprazole, probiotic, sucralfate, famotidine  Tobacco use, alcohol use. 1/2 ppd. Interested in cutting back. Cannot afford nicotine replacements.  - multivitamin  Depression/anxiety- seeing a psychiatrist Previous medications: Citalopram, alprazolam, gabapentin, xanax. Just changed to new depression medication "starts with a B".   Chronic pain-  gabapentin  Health maintenance-  covid 19 vaccine series completed.  mammogram- information.  Flu vaccine- agreeable  [place] in the shower Thursday- describes slipping on body wash on the floor. Has a lump on her head which is painful but has been improving. No focal changes since fall.  Dr Amalia Hailey, podiatry- hammer toe, bunions. Needs new referral for insurance.   OBJECTIVE:   BP (!) 145/80   Pulse (!) 126   Ht 5\' 5"  (1.651 m)   Wt 191 lb 3.2 oz (86.7 kg)   SpO2 95%   BMI 31.82 kg/m   Repeat BP 130/80 x2.  General: NAD, pleasant, able to participate in exam. Smells of tobacco HEENT: approximately 1cm diameter swelling with abrasion to right posterior  parietal area mildly tender to palpation with no bony abnormality. Cardiac: RRR, normal heart sounds, no murmurs. 2+ radial and PT pulses bilaterally Respiratory: CTAB, normal effort, No wheezes, rales or rhonchi Abdomen: soft, nontender, nondistended, no hepatic or splenomegaly, +BS Extremities: no edema or cyanosis. WWP. Skin: warm and dry, no rashes noted Neuro: alert and oriented, no focal deficits, cranial nerves intact, normal finger-to-nose, upper and lower extremity strength symmetrical Psych: Normal affect and mood  ASSESSMENT/PLAN:   HTN (hypertension) Patient has not had BP medications >7yr and her BP is normal today and is asymptomatic but requesting her BP meds be refilled. Scheduled patient for nurse visit in 1 week to recheck her BP and can discuss restarting meds at that time if needed. She attributes her normal BP today to having taken her anxiety medications this morning. She may be a good candidate for the ambulatory BP monitoring.   GERD (gastroesophageal reflux disease) Refilled new PPI.  Cannot test for hpylori breath today since she has been taking OTC PPIs. Referred back to her GI specialist. Seems appropriate to repeat EGD since patient has significant and intractable GERD symptoms despite optimal therapy.   Tobacco use Offered patient assistance. We decided that 1-800-quit-now would be best given financial restraints Patient provided with their contact card at discharge  Depression Continue to follow with psych for medication refills.  Requested patient bring her prescription in with her at next visit so we can reconcile all her medications  Hammer toe Patient endorses having hammer toe and bunions which she would like to see podiatry for. She has seen Dr. Hilaria Ota in the past but had  insurance change and doesn't think his office if covered any more  Poor compliance Patient has several barriers to following up with medical care. Attempted to address several issues  today while she was here. Due to financial restraints and patient's insurance lapse, avoided any uneccessary workups/screenings as discussed with patient. - recommended flu vaccine from pharmacy. Already completed COVID vaccines - referral for mammogram - refilled liquid tears - referred to GI and refilled a new PPI since old one has stopped being effective - referred to podiatry     Richarda Osmond, [place]

## 2020-03-04 NOTE — Telephone Encounter (Signed)
Patient calls nurse line stating she was under the impression nausea medicine was going to be called in today. I do not see any notes about this or any current nausea medications in her chart. Please advise.

## 2020-03-04 NOTE — Patient Instructions (Signed)
It was a pleasure to see you today!  To summarize our discussion for this visit:  Your blood pressure looked well controlled today. We will see you again at [facility] 9/28 for a repeat blood pressure check. We can talk about restarting your medications at that time if it's high.  I have sent in referrals to be seen again by your podiatrist for your feet problems and your GI doctor for your reflux symptoms. I've refilled a medication for your reflux.  Please complete your mammogram at the breast center.  I'm glad you got your COVID vaccine. You are also do for your yearly flu shot which you can get at your pharmacy.  I'm providing you with a card for information and help in stopping tobacco use. Call 1-800-quit-now.  I would recommend using ice on your head for the swelling  Some additional health maintenance measures we should update are: Health Maintenance Due  Topic Date Due  . COVID-19 Vaccine (1) Never done  . MAMMOGRAM  Never done  . INFLUENZA VACCINE  01/13/2020  .   Please return to our clinic to see your PCP in 1 month.  Call the clinic at [phone] if your symptoms worsen or you have any concerns.   Thank you for allowing me to take part in your care,  Dr. Doristine Mango   Food Choices for Gastroesophageal Reflux Disease, Adult When you have gastroesophageal reflux disease (GERD), the foods you eat and your eating habits are very important. Choosing the right foods can help ease your discomfort. Think about working with a nutrition specialist (dietitian) to help you make good choices. What are tips for following this plan?  Meals  Choose healthy foods that are low in fat, such as fruits, vegetables, whole grains, low-fat dairy products, and lean meat, fish, and poultry.  Eat small meals often instead of 3 large meals a day. Eat your meals slowly, and in a place where you are relaxed. Avoid bending over or lying down until 2-3 hours after eating.  Avoid eating meals  2-3 hours before bed.  Avoid drinking a lot of liquid with meals.  Cook foods using methods other than frying. Bake, grill, or broil food instead.  Avoid or limit: ? Chocolate. ? Peppermint or spearmint. ? Alcohol. ? Pepper. ? Black and decaffeinated coffee. ? Black and decaffeinated tea. ? Bubbly (carbonated) soft drinks. ? Caffeinated energy drinks and soft drinks.  Limit high-fat foods such as: ? Fatty meat or fried foods. ? Whole milk, cream, butter, or ice cream. ? Nuts and nut butters. ? Pastries, donuts, and sweets made with butter or shortening.  Avoid foods that cause symptoms. These foods may be different for everyone. Common foods that cause symptoms include: ? Tomatoes. ? Oranges, lemons, and limes. ? Peppers. ? Spicy food. ? Onions and garlic. ? Vinegar. Lifestyle  Maintain a healthy weight. Ask your doctor what weight is healthy for you. If you need to lose weight, work with your doctor to do so safely.  Exercise for at least 30 minutes for 5 or more days each week, or as told by your doctor.  Wear loose-fitting clothes.  Do not smoke. If you need help quitting, ask your doctor.  Sleep with the head of your bed higher than your feet. Use a wedge under the mattress or blocks under the bed frame to raise the head of the bed. Summary  When you have gastroesophageal reflux disease (GERD), food and lifestyle choices are very important in  easing your symptoms.  Eat small meals often instead of 3 large meals a day. Eat your meals slowly, and in a place where you are relaxed.  Limit high-fat foods such as fatty meat or fried foods.  Avoid bending over or lying down until 2-3 hours after eating.  Avoid peppermint and spearmint, caffeine, alcohol, and chocolate. This information is not intended to replace advice given to you by your health care provider. Make sure you discuss any questions you have with your health care provider. Document Revised: 09/21/2018  Document Reviewed: 07/06/2016 Elsevier Patient Education  [place].

## 2020-03-05 NOTE — Telephone Encounter (Signed)
She may be referring to the GERD medication we discussed. I sent in her GERD medication and that is what we had discussed in the visit as well as in the AVS

## 2020-03-06 ENCOUNTER — Telehealth: Payer: Self-pay

## 2020-03-06 DIAGNOSIS — Z91199 Patient's noncompliance with other medical treatment and regimen due to unspecified reason: Secondary | ICD-10-CM | POA: Insufficient documentation

## 2020-03-06 DIAGNOSIS — Z9119 Patient's noncompliance with other medical treatment and regimen: Secondary | ICD-10-CM | POA: Insufficient documentation

## 2020-03-06 DIAGNOSIS — M2040 Other hammer toe(s) (acquired), unspecified foot: Secondary | ICD-10-CM | POA: Insufficient documentation

## 2020-03-06 DIAGNOSIS — Z720 Tobacco use: Secondary | ICD-10-CM | POA: Insufficient documentation

## 2020-03-06 NOTE — Assessment & Plan Note (Signed)
Patient endorses having hammer toe and bunions which she would like to see podiatry for. She has seen Dr. Hilaria Ota in the past but had insurance change and doesn't think his office if covered any more

## 2020-03-06 NOTE — Assessment & Plan Note (Signed)
Patient has not had BP medications >33yr and her BP is normal today and is asymptomatic but requesting her BP meds be refilled. Scheduled patient for nurse visit in 1 week to recheck her BP and can discuss restarting meds at that time if needed. She attributes her normal BP today to having taken her anxiety medications this morning. She may be a good candidate for the ambulatory BP monitoring.

## 2020-03-06 NOTE — Telephone Encounter (Signed)
Received fax from pharmacy, PA needed on Pantoprazole. Clinical questions submitted via Cover My Meds. Waiting on response, could take up to 72 hours.  Cover My Meds info: Key: [id number]

## 2020-03-06 NOTE — Assessment & Plan Note (Signed)
Refilled new PPI.  Cannot test for hpylori breath today since she has been taking OTC PPIs. Referred back to her GI specialist. Seems appropriate to repeat EGD since patient has significant and intractable GERD symptoms despite optimal therapy.

## 2020-03-06 NOTE — Assessment & Plan Note (Signed)
Offered patient assistance. We decided that 1-800-quit-now would be best given financial restraints Patient provided with their contact card at discharge

## 2020-03-06 NOTE — Assessment & Plan Note (Addendum)
Patient has several barriers to following up with medical care. Attempted to address several issues today while she was here. Due to financial restraints and patient's insurance lapse, avoided any uneccessary workups/screenings as discussed with patient. - recommended flu vaccine from pharmacy. Already completed COVID vaccines - referral for mammogram - refilled liquid tears - referred to GI and refilled a new PPI since old one has stopped being effective - referred to podiatry

## 2020-03-06 NOTE — Assessment & Plan Note (Signed)
Continue to follow with psych for medication refills.  Requested patient bring her prescription in with her at next visit so we can reconcile all her medications

## 2020-03-11 ENCOUNTER — Ambulatory Visit: Payer: Medicaid Other

## 2020-05-06 ENCOUNTER — Ambulatory Visit: Payer: Medicaid Other | Admitting: Nurse Practitioner

## 2020-05-15 ENCOUNTER — Other Ambulatory Visit: Payer: Self-pay | Admitting: Family Medicine

## 2020-06-16 ENCOUNTER — Ambulatory Visit: Payer: BLUE CROSS/BLUE SHIELD | Admitting: Podiatry

## 2020-06-19 NOTE — Progress Notes (Signed)
SUBJECTIVE:   CHIEF COMPLAINT / HPI:    Pt is a 65 y.o. yo female who presents for a well woman exam.  PMH:  Allergic rhinitis- needing new eye drops, avoids wearing makeup, worse last 5 months, clear discharge sometimes matted in morning, no vision changes or loss or vision or blurry vision HTN- has been off of home medications for a year, no headaches Depression/Anxiety- follows with Dr [place] psychiat[facility], cannot remember name of the office, denies SI/HI, feels it is worse since she went on disability one year ago and doesn't get out as much or interact with people Diverticulitis- did not require surgery Seizures- from xanax withdrawal in 2013 Hammer toes Chronic low back pain IBS Obesity- BMI 31 today  Obstetrical Hx: G1P1  Surgical Hx:  Appendectomy- after rupture in 2007 Left shoulder dislocation Cataracts  Social Hx: Lives at home with partner spouse of 34 years, has one daughter 71 years old Tobacco Use: smoked 1ppd x since age 78, in last two years decreased to 1/2 pdd, thus a total 43-pack-year smoking history, has quit before when she was pregnant with her daughter Alcohol use: 3 Mikes Hard lemonades/night, every night, denies history of withdrawal seizures/symptoms Other substance use: IPV: Pt feels same at home and in current relationships.  Preventative Health [x]  Lipid screening (q5 yrs, >8 y/o): 08/23/2017, LDL <70 [x]  Statin: none [x]  Colonoscopy (q10 yrs, 78-75 y/o): 11/28/2012, no polyps, repeat in 10 years [x]  Pap smear (q3 yrs, 21-65 y/o): 08/23/2017, NILM and HPV negative [DUE] Mammogram (q2 yrs, 7-74 y/o): DUE! Never had! [DUE] DM II (q3 yrs, asx w/ BP >135/80 or w/ cardiac risk factors): last A1c in 2014 was 5.2 [DUE]  Low-dose CT (q1 yr, 19-80 y/o w/ 30 pack-year history who currently smoke or quit within past 15 years): 43 pack year history [x]  Hep C screening (born between '45 and '65, or risk factors): 08/23/2017 [x]  HIV (universal once,  repeat if risk factors): 08/23/2017 [DUE] Flu vaccine (q1 yr): due today [x]  COVID-19 vaccination series, plus booster if > 6 months out: got yesterday 06/19/20 [x]  Tdap (q10 yrs, if rusty object & >5yo repeat): given 02/17/2011 [ ]  Zoster vaccine (once, >60 y/o):  [DUE]  Smoking cessation: discussed 06/20/2020 [x]   Domestic violence screen: denies 06/20/2020   PERTINENT  PMH / PSH: IBS, GERD, alcohol use disorder, tobacco use disorder  OBJECTIVE:   BP 122/80   Pulse 89   Ht 5\' 6"  (1.676 m)   Wt 194 lb (88 kg)   SpO2 94%   BMI 31.31 kg/m   General: A&O, NAD HEENT: No sign of trauma, PERRL, EOMI, conjunctival injection of both eyes, clear tears, no sign of corneal abrasion, eyelids without erythema or swelling Cardiac: RRR, no m/r/g Respiratory: CTAB, normal WOB, no w/c/r GI: Soft, NTTP, non-distended  Extremities: left shoulder mild tenderness to palpation, no peripheral edema. Neuro: Normal gait, moves all four extremities appropriately. Psych: Appropriate mood and affect   ASSESSMENT/PLAN:   HTN (hypertension) - has been off of home medications for over a year, normotensive in office today, will check BMP and will not refill medications at this time  GERD (gastroesophageal reflux disease) - refilled Nexium 40mg  today, this is higher than recommended dose and will discuss decreasing at future visits  Tobacco use disorder - discussed in detail today with patient, > 10 minutes spent in counseling, she feels she smokes more when she is drinking, discussed that she has quit before with her daughter, discussed  health implications of quitting smoking, set up for screening lung CT as above - discussed medications that can be prescribed for smoking cessation, will schedule for follow-up appoint to discuss more  Depression - denies SI/HI, worse in past year since she quit her job and has been on disability - discussed activities she could do to interact more with others - discussed  decreasing alcohol use as below as this can definitely be contributing to depression and with her chronic Xanax use puts her at risk of respiratory depression  Alcohol use - discussed recommended number of drinks to one or less per day for women, more than this putting her at risk for alcohol use disorder, also discussed that this may be worsening her depression and she notes she is more likely to smoke when she is drinking - discussed that she has not tried to stop but suspects she might have cravings if she does, briefly discussed that there are medications that can help with this, will check CMP today to assess LFTs and schedule for follow-up visit to discuss in more detail  Body mass index (BMI) 31.0-31.9, adult - screening Hgb A1c today, recent lipid panel in 2019 wnl - plan to schedule follow-up visit to discuss lifestyle modifications in more detail  Encounter for screening mammogram for breast cancer - mammogram ordered and number given to patient to call to schedule  Encounter for screening for lung cancer - low dose lung CT ordered today per USPSTF recommendations as patient has a 43 pack year smoking history   Need for immunization against influenza Flu vaccine given today  Allergic conjunctivitis of both eyes - drops sent to pharmacy  Chronic left shoulder pain - unable to discuss or examine in detail due to time limitations, XR ordered and will evaluate at follow-up appointment  Back pain - unable to assess at today's appointment, follow-up appointment scheduled and XR ordered to discuss at follow-up appointment     Billey Co, MD [facility]

## 2020-06-20 ENCOUNTER — Other Ambulatory Visit: Payer: Self-pay

## 2020-06-20 ENCOUNTER — Ambulatory Visit (INDEPENDENT_AMBULATORY_CARE_PROVIDER_SITE_OTHER): Payer: 59 | Admitting: Family Medicine

## 2020-06-20 ENCOUNTER — Encounter: Payer: Self-pay | Admitting: Family Medicine

## 2020-06-20 VITALS — BP 122/80 | HR 89 | Ht 66.0 in | Wt 194.0 lb

## 2020-06-20 DIAGNOSIS — K219 Gastro-esophageal reflux disease without esophagitis: Secondary | ICD-10-CM

## 2020-06-20 DIAGNOSIS — M545 Low back pain, unspecified: Secondary | ICD-10-CM

## 2020-06-20 DIAGNOSIS — I10 Essential (primary) hypertension: Secondary | ICD-10-CM

## 2020-06-20 DIAGNOSIS — Z1231 Encounter for screening mammogram for malignant neoplasm of breast: Secondary | ICD-10-CM

## 2020-06-20 DIAGNOSIS — Z122 Encounter for screening for malignant neoplasm of respiratory organs: Secondary | ICD-10-CM

## 2020-06-20 DIAGNOSIS — H1013 Acute atopic conjunctivitis, bilateral: Secondary | ICD-10-CM

## 2020-06-20 DIAGNOSIS — G8929 Other chronic pain: Secondary | ICD-10-CM

## 2020-06-20 DIAGNOSIS — Z23 Encounter for immunization: Secondary | ICD-10-CM | POA: Diagnosis not present

## 2020-06-20 DIAGNOSIS — M25512 Pain in left shoulder: Secondary | ICD-10-CM

## 2020-06-20 DIAGNOSIS — F17200 Nicotine dependence, unspecified, uncomplicated: Secondary | ICD-10-CM

## 2020-06-20 DIAGNOSIS — F32A Depression, unspecified: Secondary | ICD-10-CM

## 2020-06-20 DIAGNOSIS — Z789 Other specified health status: Secondary | ICD-10-CM

## 2020-06-20 DIAGNOSIS — Z6831 Body mass index (BMI) 31.0-31.9, adult: Secondary | ICD-10-CM

## 2020-06-20 DIAGNOSIS — Z7289 Other problems related to lifestyle: Secondary | ICD-10-CM

## 2020-06-20 MED ORDER — AZELASTINE HCL 0.05 % OP SOLN: 1.0000 [drp] | Freq: Two times a day (BID) | OPHTHALMIC | 0 refills | Status: DC

## 2020-06-20 MED ORDER — ESOMEPRAZOLE MAGNESIUM 40 MG PO CPDR: 40.0000 mg | DELAYED_RELEASE_CAPSULE | Freq: Every day | ORAL | 3 refills | Status: DC

## 2020-06-20 NOTE — Patient Instructions (Addendum)
It was wonderful to see you today.  Please bring ALL of your medications with you to every visit.   Today we talked about:  - Flu shot given today - We have ordered a CT low dose of your chest to screen for lung cancer due to your smoking history - We had ordered a screening mammogram as it has been several years since you had one - We have ordered X-rays of your shoulders and low back to discuss at your next appointment - We discussed cutting back on your alcohol use and smoking, discussed there are medications to try that we will discuss in more detail at follow-up appointment - Sent your Nexium to pharmacy - We will check your lab work and screen for diabetes today - We are not refilling your blood pressure medications because your blood pressure is perfect today! We will keep an eye on it - I will refill drips for your eyes and follow up with an eye doctor for an exam - Schedule follow up in next month or sooner at your convenience to discuss pain and medications to cut back on alcohol and smoking   Thank you for choosing [facility].   Please call [phone] with any questions about today's appointment.  Please be sure to schedule follow up at the front  desk before you leave today.   Yehuda Savannah, MD  Family Medicine

## 2020-06-21 LAB — COMPREHENSIVE METABOLIC PANEL
ALT: 20 IU/L (ref 0–32)
AST: 25 IU/L (ref 0–40)
Albumin/Globulin Ratio: 1.9 (ref 1.2–2.2)
Albumin: 4.3 g/dL (ref 3.8–4.8)
Alkaline Phosphatase: 85 IU/L (ref 44–121)
BUN/Creatinine Ratio: 6 — ABNORMAL LOW (ref 12–28)
BUN: 4 mg/dL — ABNORMAL LOW (ref 8–27)
Bilirubin Total: 0.4 mg/dL (ref 0.0–1.2)
CO2: 25 mmol/L (ref 20–29)
Calcium: 9.2 mg/dL (ref 8.7–10.3)
Chloride: 100 mmol/L (ref 96–106)
Creatinine, Ser: 0.72 mg/dL (ref 0.57–1.00)
GFR calc Af Amer: 104 mL/min/{1.73_m2} (ref >59)
GFR calc non Af Amer: 90 mL/min/{1.73_m2} (ref >59)
Globulin, Total: 2.3 g/dL (ref 1.5–4.5)
Glucose: 97 mg/dL (ref 65–99)
Potassium: 3.9 mmol/L (ref 3.5–5.2)
Sodium: 138 mmol/L (ref 134–144)
Total Protein: 6.6 g/dL (ref 6.0–8.5)

## 2020-06-21 LAB — HEMOGLOBIN A1C
Est. average glucose Bld gHb Est-mCnc: 105 mg/dL
Hgb A1c MFr Bld: 5.3 % (ref 4.8–5.6)

## 2020-06-22 ENCOUNTER — Encounter: Payer: Self-pay | Admitting: Family Medicine

## 2020-06-22 DIAGNOSIS — H1013 Acute atopic conjunctivitis, bilateral: Secondary | ICD-10-CM | POA: Insufficient documentation

## 2020-06-22 DIAGNOSIS — G8929 Other chronic pain: Secondary | ICD-10-CM | POA: Insufficient documentation

## 2020-06-22 DIAGNOSIS — Z23 Encounter for immunization: Secondary | ICD-10-CM | POA: Insufficient documentation

## 2020-06-22 DIAGNOSIS — Z122 Encounter for screening for malignant neoplasm of respiratory organs: Secondary | ICD-10-CM | POA: Insufficient documentation

## 2020-06-22 NOTE — Assessment & Plan Note (Signed)
-   discussed recommended number of drinks to one or less per day for women, more than this putting her at risk for alcohol use disorder, also discussed that this may be worsening her depression and she notes she is more likely to smoke when she is drinking - discussed that she has not tried to stop but suspects she might have cravings if she does, briefly discussed that there are medications that can help with this, will check CMP today to assess LFTs and schedule for follow-up visit to discuss in more detail

## 2020-06-22 NOTE — Assessment & Plan Note (Signed)
-   denies SI/HI, worse in past year since she quit her job and has been on disability - discussed activities she could do to interact more with others - discussed decreasing alcohol use as below as this can definitely be contributing to depression and with her chronic Xanax use puts her at risk of respiratory depression

## 2020-06-22 NOTE — Assessment & Plan Note (Signed)
-   refilled Nexium 40mg  today, this is higher than recommended dose and will discuss decreasing at future visits

## 2020-06-22 NOTE — Assessment & Plan Note (Signed)
-   discussed in detail today with patient, > 10 minutes spent in counseling, she feels she smokes more when she is drinking, discussed that she has quit before with her daughter, discussed health implications of quitting smoking, set up for screening lung CT as above - discussed medications that can be prescribed for smoking cessation, will schedule for follow-up appoint to discuss more

## 2020-06-22 NOTE — Assessment & Plan Note (Addendum)
-   mammogram ordered and number given to patient to call to schedule

## 2020-06-22 NOTE — Assessment & Plan Note (Signed)
-   has been off of home medications for over a year, normotensive in office today, will check BMP and will not refill medications at this time

## 2020-06-22 NOTE — Assessment & Plan Note (Signed)
-   low dose lung CT ordered today per USPSTF recommendations as patient has a 43 pack year smoking history

## 2020-06-22 NOTE — Assessment & Plan Note (Signed)
Flu vaccine given today. 

## 2020-06-22 NOTE — Assessment & Plan Note (Signed)
-   unable to assess at today's appointment, follow-up appointment scheduled and XR ordered to discuss at follow-up appointment

## 2020-06-22 NOTE — Assessment & Plan Note (Signed)
-   unable to discuss or examine in detail due to time limitations, XR ordered and will evaluate at follow-up appointment

## 2020-06-22 NOTE — Assessment & Plan Note (Signed)
-   drops sent to pharmacy

## 2020-06-22 NOTE — Assessment & Plan Note (Signed)
-   screening Hgb A1c today, recent lipid panel in 2019 wnl - plan to schedule follow-up visit to discuss lifestyle modifications in more detail

## 2020-06-23 ENCOUNTER — Ambulatory Visit: Payer: BLUE CROSS/BLUE SHIELD | Admitting: Podiatry

## 2020-06-27 ENCOUNTER — Telehealth: Payer: Self-pay

## 2020-06-27 NOTE — Telephone Encounter (Signed)
Called and discussed with patient normal CMP and Hgb A1c. Discussed dates for scheduled CT lung screening and X-rays, and next appointment with myself. Discussed we can do lipid panel at next appointment. All patient questions and concerns answered.  Yehuda Savannah MD

## 2020-06-27 NOTE — Telephone Encounter (Signed)
Patient calls nurse line requesting returned phone call from provider to discuss recent lab results. Patient is also asking about cholesterol levels. Informed patient that cholesterol wasn't checked at last visit.   Patient is requesting that lipid panel be drawn at next visit on 1/28. Patient states that she will be fasting for this appointment.   To PCP  Talbot Grumbling, RN

## 2020-06-30 ENCOUNTER — Ambulatory Visit (HOSPITAL_COMMUNITY): Payer: 59

## 2020-07-01 ENCOUNTER — Ambulatory Visit: Payer: Medicaid Other | Admitting: Gastroenterology

## 2020-07-07 ENCOUNTER — Ambulatory Visit (INDEPENDENT_AMBULATORY_CARE_PROVIDER_SITE_OTHER): Payer: 59

## 2020-07-07 ENCOUNTER — Other Ambulatory Visit: Payer: Self-pay

## 2020-07-07 ENCOUNTER — Ambulatory Visit (INDEPENDENT_AMBULATORY_CARE_PROVIDER_SITE_OTHER): Payer: 59 | Admitting: Podiatry

## 2020-07-07 DIAGNOSIS — M21619 Bunion of unspecified foot: Secondary | ICD-10-CM

## 2020-07-07 DIAGNOSIS — M2011 Hallux valgus (acquired), right foot: Secondary | ICD-10-CM

## 2020-07-07 DIAGNOSIS — M2141 Flat foot [pes planus] (acquired), right foot: Secondary | ICD-10-CM

## 2020-07-07 DIAGNOSIS — M2041 Other hammer toe(s) (acquired), right foot: Secondary | ICD-10-CM | POA: Diagnosis not present

## 2020-07-07 DIAGNOSIS — M2042 Other hammer toe(s) (acquired), left foot: Secondary | ICD-10-CM

## 2020-07-07 DIAGNOSIS — M21611 Bunion of right foot: Secondary | ICD-10-CM

## 2020-07-07 DIAGNOSIS — M21612 Bunion of left foot: Secondary | ICD-10-CM

## 2020-07-07 DIAGNOSIS — L989 Disorder of the skin and subcutaneous tissue, unspecified: Secondary | ICD-10-CM

## 2020-07-07 DIAGNOSIS — M2142 Flat foot [pes planus] (acquired), left foot: Secondary | ICD-10-CM

## 2020-07-07 NOTE — Progress Notes (Deleted)
    SUBJECTIVE:   CHIEF COMPLAINT / HPI:   ***  Tobacco use- smoked 1ppd x since age 65, in last two years decreased to 1/2 pdd, thus a total 43-pack-year smoking history, has quit before when she was pregnant with her daughter.  Alcohol use- 3 Mikes Hard lemonades/night, every night, denies history of withdrawal seizures/symptoms  PERTINENT  PMH / PSH: h/o seizures after Xanax withdrawal  OBJECTIVE:   There were no vitals taken for this visit.  General: A&O, NAD HEENT: No sign of trauma, EOM grossly intact Cardiac: RRR, no m/r/g Respiratory: CTAB, normal WOB, no w/c/r GI: Soft, NTTP, non-distended  Extremities: NTTP, no peripheral edema. Neuro: Normal gait, moves all four extremities appropriately. Psych: Appropriate mood and affect   ASSESSMENT/PLAN:   No problem-specific Assessment & Plan notes found for this encounter.     [name], MD [place]

## 2020-07-09 ENCOUNTER — Ambulatory Visit (HOSPITAL_COMMUNITY): Payer: 59

## 2020-07-11 ENCOUNTER — Ambulatory Visit: Payer: Medicaid Other | Admitting: Family Medicine

## 2020-07-11 ENCOUNTER — Ambulatory Visit: Payer: Medicaid Other | Admitting: Gastroenterology

## 2020-07-11 NOTE — Progress Notes (Deleted)
    SUBJECTIVE:   CHIEF COMPLAINT / HPI:   65 yo female who presents to discuss smoking cessation, decreasing alcohol use, and pain.  Tobacco use- smoked 1ppd x since age 73, in last two years decreased to 1/2 pdd, thus a total 43-pack-year smoking history, has quit before when she was pregnant with her daughter. Main smoking triggers are  ***.  Alcohol use- 3 Mikes Hard lemonades/night, every night, denies history of withdrawal seizures/symptoms. Recent CMP with normal LFTs. Does ***not feel like she drinks to help control her pain. Denies*** eye openers in the morning.  Low back pain-   Left shoulder pain- history of left shoulder dislocation  PERTINENT  PMH / PSH: h/o seizures after Xanax withdrawal  OBJECTIVE:   There were no vitals taken for this visit.  General: A&O, NAD HEENT: No sign of trauma, EOM grossly intact Cardiac: RRR, no m/r/g Respiratory: CTAB, normal WOB, no w/c/r GI: Soft, NTTP, non-distended  Extremities: NTTP, no peripheral edema. Neuro: Normal gait, moves all four extremities appropriately. Psych: Appropriate mood and affect   ASSESSMENT/PLAN:   No problem-specific Assessment & Plan notes found for this encounter.     [name], MD [place]

## 2020-07-14 ENCOUNTER — Other Ambulatory Visit: Payer: Self-pay

## 2020-07-14 ENCOUNTER — Ambulatory Visit (HOSPITAL_COMMUNITY)
Admission: RE | Admit: 2020-07-14 | Discharge: 2020-07-14 | Disposition: A | Discharge status: Home / Self Care | Payer: 59 | Source: Ambulatory Visit | Attending: Family Medicine | Admitting: Family Medicine

## 2020-07-14 ENCOUNTER — Encounter: Payer: Self-pay | Admitting: Family Medicine

## 2020-07-14 DIAGNOSIS — M545 Low back pain, unspecified: Secondary | ICD-10-CM

## 2020-07-14 DIAGNOSIS — M25512 Pain in left shoulder: Secondary | ICD-10-CM | POA: Diagnosis present

## 2020-07-14 DIAGNOSIS — G8929 Other chronic pain: Secondary | ICD-10-CM | POA: Insufficient documentation

## 2020-07-14 DIAGNOSIS — R918 Other nonspecific abnormal finding of lung field: Secondary | ICD-10-CM | POA: Insufficient documentation

## 2020-07-14 DIAGNOSIS — Z122 Encounter for screening for malignant neoplasm of respiratory organs: Secondary | ICD-10-CM | POA: Diagnosis present

## 2020-07-15 ENCOUNTER — Other Ambulatory Visit: Payer: Self-pay | Admitting: *Deleted

## 2020-07-15 DIAGNOSIS — H1013 Acute atopic conjunctivitis, bilateral: Secondary | ICD-10-CM

## 2020-07-15 MED ORDER — FLUTICASONE PROPIONATE 50 MCG/ACT NA SUSP: NASAL | 0 refills | Status: DC

## 2020-07-15 MED ORDER — AZELASTINE HCL 0.05 % OP SOLN: 1.0000 [drp] | Freq: Two times a day (BID) | OPHTHALMIC | 0 refills | Status: DC

## 2020-07-15 NOTE — Progress Notes (Signed)
   Subjective: 65 y.o. female presenting today as a new patient for evaluation of bilateral foot pain this been going on for several years.  Patient states that she has a history of bunions and hammertoes.  She used to work on her feet all day long but has recently retired and is currently not working.  She states that since she has not been on her feet as much her feet have been feeling much better.  She presents for further treatment and evaluation  Past Medical History:  Diagnosis Date  . Alcohol abuse   . Allergy   . Anxiety   . Cataract 06/09/2012   Right eye and left eye  . Depression   . GERD (gastroesophageal reflux disease)   . 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 Seizures ([place])    xanax withdrawl- December 2013  . Urinary incontinence 06/09/2012    Objective: Physical Exam General: The patient is alert and oriented x3 in no acute distress.  Dermatology: Skin is cool, dry and supple bilateral lower extremities. Negative for open lesions or macerations.  Hyperkeratotic preulcerative callus tissue also noted to the subfourth MTPJ of the left foot  Vascular: Palpable pedal pulses bilaterally. No edema or erythema noted. Capillary refill within normal limits.  Neurological: Epicritic and protective threshold grossly intact bilaterally.   Musculoskeletal Exam: Clinical evidence of bunion deformity noted to the respective foot. There is moderate pain on palpation range of motion of the first MPJ. Lateral deviation of the hallux noted consistent with hallux abductovalgus. Hammertoe contracture also noted on clinical exam to digits 2-5 bilateral foot. Symptomatic pain on palpation and range of motion also noted to the metatarsal phalangeal joints of the respective hammertoe digits.    Radiographic Exam: Increased intermetatarsal angle greater than 15 with a hallux abductus angle greater than 30 noted on AP view. Moderate degenerative changes noted within the  first MPJ. Contracture deformity also noted to the interphalangeal joints and MPJs of the digits of the respective hammertoes. Collapse of the medial longitudinal arch noted on lateral view with a decreased calcaneal inclination angle and metatarsal declination angle.  Diffuse degenerative changes noted also throughout the pedal joints consistent with osteoarthritis    Assessment: 1. HAV w/ bunion deformity right 2. 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  Porokeratosis subfourth MTPJ left 4.  Pes planus bilateral   Plan of Care:  1. Patient was evaluated. X-Rays reviewed. Excisional debridement of the hyperkeratotic preulcerative callus lesion was performed using a tissue nipper without incident or bleeding.  Salicylic acid applied.  Recommend corn callus remover daily 2.  Recommend good supportive shoes that support the arches and support the feet 3.  I explained to the patient that if we can get her feet feeling better conservatively we do not have to undergo a large reconstructive surgery.  Patient understands.  Return to clinic in 4 weeks    Edrick Kins, DPM [facility]  Dr. Edrick Kins, DPM    [address], [place] [address]                Office [phone]  Fax [phone]

## 2020-07-16 ENCOUNTER — Ambulatory Visit: Payer: Medicaid Other | Admitting: Family Medicine

## 2020-07-18 ENCOUNTER — Telehealth: Payer: Self-pay

## 2020-07-18 NOTE — Telephone Encounter (Signed)
Patient calls nurse line requesting returned phone call from provider to discuss imaging results from 1/31.  Please advise  Talbot Grumbling, RN

## 2020-07-21 NOTE — Telephone Encounter (Signed)
Attempted to call patient, VM not set up and unable to leave VM. If she calls back, please let her know her back and shoulder X-rays did not show acute fracture, the lumbar spine X-ray showed some chronic changes consistent with wear and tear or arthritis, and we can discuss further management for pain at her next appt.  Thanks! Dr Thompson Grayer

## 2020-07-23 ENCOUNTER — Ambulatory Visit: Payer: 59 | Admitting: Gastroenterology

## 2020-07-23 NOTE — Telephone Encounter (Signed)
Patient returns call to nurse line to discuss results. Informed patient of below. Patient has further questions regarding management of shoulder and hip pain.   Strongly encouraged patient to keep follow up appointment to discuss further.   Patient is requesting returned phone call from PCP to discuss.   Talbot Grumbling, RN

## 2020-07-23 NOTE — Telephone Encounter (Signed)
Agree, please let patient know she needs to be seen for full exam and history before further medication/prescription can be prescribed or other course of action can be taken. Can make an appointment sooner than her next one with me  or another provider if she wants to be evaluated sooner but this cannot be done over the phone.  Thanks, Dr Thompson Grayer

## 2020-08-04 ENCOUNTER — Ambulatory Visit: Payer: 59 | Admitting: Podiatry

## 2020-08-07 NOTE — Progress Notes (Deleted)
    SUBJECTIVE:   CHIEF COMPLAINT / HPI:   65 yo female who presents to discuss smoking cessation, decreasing alcohol use, and pain.  Tobacco use- smoked 1ppd x since age 57, in last two years decreased to 1/2 pdd, thus a total 43-pack-year smoking history, has quit before when she was pregnant with her daughter. Main smoking triggers are  ***.  Alcohol use- 3 Mikes Hard lemonades/night, every night, denies history of withdrawal seizures/symptoms. Recent CMP with normal LFTs. Does ***not feel like she drinks to help control her pain. Denies*** eye openers in the morning.  Low back pain- XR lumbar shows degenerative change without acute abnormality.  Left shoulder pain- history of left shoulder dislocation  PERTINENT  PMH / PSH: h/o seizures after Xanax withdrawal  OBJECTIVE:   There were no vitals taken for this visit.  General: A&O, NAD HEENT: No sign of trauma, EOM grossly intact Cardiac: RRR, no m/r/g Respiratory: CTAB, normal WOB, no w/c/r GI: Soft, NTTP, non-distended  Extremities: NTTP, no peripheral edema. Neuro: Normal gait, moves all four extremities appropriately. Psych: Appropriate mood and affect   ASSESSMENT/PLAN:   No problem-specific Assessment & Plan notes found for this encounter.     [name], MD [place]

## 2020-08-13 ENCOUNTER — Ambulatory Visit: Payer: Medicaid Other | Admitting: Family Medicine

## 2020-08-15 ENCOUNTER — Ambulatory Visit: Payer: 59

## 2020-08-15 NOTE — Progress Notes (Deleted)
    SUBJECTIVE:   CHIEF COMPLAINT / HPI:   65 yo female who presents to discuss smoking cessation, decreasing alcohol use, and pain.  Tobacco use- smoked 1ppd x since age 34, in last two years decreased to 1/2 pdd, thus a total 43-pack-year smoking history, has quit before when she was pregnant with her daughter. Main smoking triggers are  ***. CT chest with solid nodularity in right lower lobe on 07/14/2020, recommended follow-up CT in 6 months.  Alcohol use- 3 Mikes Hard lemonades/night, every night, denies history of withdrawal seizures/symptoms. Recent CMP with normal LFTs. Does ***not feel like she drinks to help control her pain. Denies*** eye openers in the morning.  Low back pain- XR lumbar shows degenerative change without acute abnormality.  Left shoulder pain- history of left shoulder dislocation  PERTINENT  PMH / PSH: h/o seizures after Xanax withdrawal  OBJECTIVE:   There were no vitals taken for this visit.  General: A&O, NAD HEENT: No sign of trauma, EOM grossly intact Cardiac: RRR, no m/r/g Respiratory: CTAB, normal WOB, no w/c/r GI: Soft, NTTP, non-distended  Extremities: NTTP, no peripheral edema. Neuro: Normal gait, moves all four extremities appropriately. Psych: Appropriate mood and affect   ASSESSMENT/PLAN:   No problem-specific Assessment & Plan notes found for this encounter.   H/o withdrawal seizures from Xanax, thus Wellbutrin not likely a good option. Discussed chantix with patient.  [name], MD [place]

## 2020-08-22 ENCOUNTER — Ambulatory Visit: Payer: Medicaid Other | Admitting: Family Medicine

## 2020-09-09 ENCOUNTER — Inpatient Hospital Stay (HOSPITAL_COMMUNITY)
Admission: EM | Admit: 2020-09-09 | Discharge: 2020-09-22 | DRG: 896 | Disposition: A | Discharge status: Home Health | Payer: 59 | Attending: Family Medicine | Admitting: Family Medicine

## 2020-09-09 ENCOUNTER — Emergency Department (HOSPITAL_COMMUNITY): Payer: 59

## 2020-09-09 DIAGNOSIS — R0689 Other abnormalities of breathing: Secondary | ICD-10-CM

## 2020-09-09 DIAGNOSIS — E860 Dehydration: Secondary | ICD-10-CM | POA: Diagnosis present

## 2020-09-09 DIAGNOSIS — Z818 Family history of other mental and behavioral disorders: Secondary | ICD-10-CM

## 2020-09-09 DIAGNOSIS — E8770 Fluid overload, unspecified: Secondary | ICD-10-CM | POA: Diagnosis not present

## 2020-09-09 DIAGNOSIS — Z781 Physical restraint status: Secondary | ICD-10-CM

## 2020-09-09 DIAGNOSIS — Z6832 Body mass index (BMI) 32.0-32.9, adult: Secondary | ICD-10-CM

## 2020-09-09 DIAGNOSIS — T4275XA Adverse effect of unspecified antiepileptic and sedative-hypnotic drugs, initial encounter: Secondary | ICD-10-CM | POA: Diagnosis present

## 2020-09-09 DIAGNOSIS — E669 Obesity, unspecified: Secondary | ICD-10-CM | POA: Diagnosis present

## 2020-09-09 DIAGNOSIS — R4182 Altered mental status, unspecified: Secondary | ICD-10-CM

## 2020-09-09 DIAGNOSIS — I10 Essential (primary) hypertension: Secondary | ICD-10-CM

## 2020-09-09 DIAGNOSIS — I9589 Other hypotension: Secondary | ICD-10-CM | POA: Diagnosis not present

## 2020-09-09 DIAGNOSIS — R072 Precordial pain: Secondary | ICD-10-CM | POA: Diagnosis present

## 2020-09-09 DIAGNOSIS — F419 Anxiety disorder, unspecified: Secondary | ICD-10-CM | POA: Diagnosis present

## 2020-09-09 DIAGNOSIS — J9601 Acute respiratory failure with hypoxia: Secondary | ICD-10-CM | POA: Diagnosis not present

## 2020-09-09 DIAGNOSIS — G40509 Epileptic seizures related to external causes, not intractable, without status epilepticus: Secondary | ICD-10-CM | POA: Diagnosis not present

## 2020-09-09 DIAGNOSIS — J9602 Acute respiratory failure with hypercapnia: Secondary | ICD-10-CM | POA: Diagnosis present

## 2020-09-09 DIAGNOSIS — F10239 Alcohol dependence with withdrawal, unspecified: Secondary | ICD-10-CM | POA: Diagnosis present

## 2020-09-09 DIAGNOSIS — I480 Paroxysmal atrial fibrillation: Secondary | ICD-10-CM | POA: Diagnosis not present

## 2020-09-09 DIAGNOSIS — E871 Hypo-osmolality and hyponatremia: Secondary | ICD-10-CM | POA: Diagnosis present

## 2020-09-09 DIAGNOSIS — J811 Chronic pulmonary edema: Secondary | ICD-10-CM | POA: Diagnosis present

## 2020-09-09 DIAGNOSIS — I4819 Other persistent atrial fibrillation: Secondary | ICD-10-CM | POA: Diagnosis present

## 2020-09-09 DIAGNOSIS — Z7901 Long term (current) use of anticoagulants: Secondary | ICD-10-CM

## 2020-09-09 DIAGNOSIS — A419 Sepsis, unspecified organism: Secondary | ICD-10-CM | POA: Diagnosis not present

## 2020-09-09 DIAGNOSIS — F10231 Alcohol dependence with withdrawal delirium: Principal | ICD-10-CM | POA: Diagnosis present

## 2020-09-09 DIAGNOSIS — R12 Heartburn: Secondary | ICD-10-CM | POA: Diagnosis present

## 2020-09-09 DIAGNOSIS — R6521 Severe sepsis with septic shock: Secondary | ICD-10-CM | POA: Diagnosis not present

## 2020-09-09 DIAGNOSIS — F10939 Alcohol use, unspecified with withdrawal, unspecified: Secondary | ICD-10-CM | POA: Diagnosis present

## 2020-09-09 DIAGNOSIS — F32A Depression, unspecified: Secondary | ICD-10-CM | POA: Diagnosis present

## 2020-09-09 DIAGNOSIS — E46 Unspecified protein-calorie malnutrition: Secondary | ICD-10-CM | POA: Diagnosis present

## 2020-09-09 DIAGNOSIS — G9341 Metabolic encephalopathy: Secondary | ICD-10-CM | POA: Diagnosis present

## 2020-09-09 DIAGNOSIS — R778 Other specified abnormalities of plasma proteins: Secondary | ICD-10-CM | POA: Diagnosis present

## 2020-09-09 DIAGNOSIS — F10931 Alcohol use, unspecified with withdrawal delirium: Secondary | ICD-10-CM

## 2020-09-09 DIAGNOSIS — R062 Wheezing: Secondary | ICD-10-CM

## 2020-09-09 DIAGNOSIS — Z20822 Contact with and (suspected) exposure to covid-19: Secondary | ICD-10-CM | POA: Diagnosis present

## 2020-09-09 DIAGNOSIS — E861 Hypovolemia: Secondary | ICD-10-CM | POA: Diagnosis present

## 2020-09-09 DIAGNOSIS — J440 Chronic obstructive pulmonary disease with acute lower respiratory infection: Secondary | ICD-10-CM | POA: Diagnosis present

## 2020-09-09 DIAGNOSIS — F17210 Nicotine dependence, cigarettes, uncomplicated: Secondary | ICD-10-CM | POA: Diagnosis present

## 2020-09-09 DIAGNOSIS — R1310 Dysphagia, unspecified: Secondary | ICD-10-CM | POA: Diagnosis present

## 2020-09-09 DIAGNOSIS — E876 Hypokalemia: Secondary | ICD-10-CM | POA: Diagnosis present

## 2020-09-09 DIAGNOSIS — R0602 Shortness of breath: Secondary | ICD-10-CM | POA: Diagnosis present

## 2020-09-09 DIAGNOSIS — G40901 Epilepsy, unspecified, not intractable, with status epilepticus: Secondary | ICD-10-CM | POA: Diagnosis present

## 2020-09-09 DIAGNOSIS — I952 Hypotension due to drugs: Secondary | ICD-10-CM | POA: Diagnosis present

## 2020-09-09 DIAGNOSIS — R079 Chest pain, unspecified: Secondary | ICD-10-CM

## 2020-09-09 DIAGNOSIS — Z79899 Other long term (current) drug therapy: Secondary | ICD-10-CM

## 2020-09-09 DIAGNOSIS — M545 Low back pain, unspecified: Secondary | ICD-10-CM | POA: Diagnosis present

## 2020-09-09 DIAGNOSIS — R569 Unspecified convulsions: Secondary | ICD-10-CM

## 2020-09-09 DIAGNOSIS — J9811 Atelectasis: Secondary | ICD-10-CM | POA: Diagnosis present

## 2020-09-09 DIAGNOSIS — N179 Acute kidney failure, unspecified: Secondary | ICD-10-CM | POA: Diagnosis present

## 2020-09-09 DIAGNOSIS — G8929 Other chronic pain: Secondary | ICD-10-CM | POA: Diagnosis present

## 2020-09-09 DIAGNOSIS — K219 Gastro-esophageal reflux disease without esophagitis: Secondary | ICD-10-CM

## 2020-09-09 DIAGNOSIS — F13239 Sedative, hypnotic or anxiolytic dependence with withdrawal, unspecified: Secondary | ICD-10-CM | POA: Diagnosis present

## 2020-09-09 LAB — CBC WITH DIFFERENTIAL/PLATELET
Abs Immature Granulocytes: 0 10*3/uL (ref 0.00–0.07)
Basophils Absolute: 0 10*3/uL (ref 0.0–0.1)
Basophils Relative: 0 %
Eosinophils Absolute: 0 10*3/uL (ref 0.0–0.5)
Eosinophils Relative: 0 %
HCT: 48.7 % — ABNORMAL HIGH (ref 36.0–46.0)
Hemoglobin: 17.7 g/dL — ABNORMAL HIGH (ref 12.0–15.0)
Lymphocytes Relative: 7 %
Lymphs Abs: 1.5 10*3/uL (ref 0.7–4.0)
MCH: 33.1 pg (ref 26.0–34.0)
MCHC: 36.3 g/dL — ABNORMAL HIGH (ref 30.0–36.0)
MCV: 91.2 fL (ref 80.0–100.0)
Monocytes Absolute: 0.4 10*3/uL (ref 0.1–1.0)
Monocytes Relative: 2 %
Neutro Abs: 19 10*3/uL — ABNORMAL HIGH (ref 1.7–7.7)
Neutrophils Relative %: 91 %
Platelets: 209 10*3/uL (ref 150–400)
RBC: 5.34 MIL/uL — ABNORMAL HIGH (ref 3.87–5.11)
RDW: 12.2 % (ref 11.5–15.5)
WBC: 20.9 10*3/uL — ABNORMAL HIGH (ref 4.0–10.5)
nRBC: 0 % (ref 0.0–0.2)

## 2020-09-09 LAB — CBG MONITORING, ED: Glucose-Capillary: 139 mg/dL — ABNORMAL HIGH (ref 70–99)

## 2020-09-09 LAB — COMPREHENSIVE METABOLIC PANEL
ALT: 34 U/L (ref 0–44)
AST: 46 U/L — ABNORMAL HIGH (ref 15–41)
Albumin: 4 g/dL (ref 3.5–5.0)
Alkaline Phosphatase: 66 U/L (ref 38–126)
Anion gap: 15 (ref 5–15)
BUN: <5 mg/dL — ABNORMAL LOW (ref 8–23)
CO2: 23 mmol/L (ref 22–32)
Calcium: 9.2 mg/dL (ref 8.9–10.3)
Chloride: 81 mmol/L — ABNORMAL LOW (ref 98–111)
Creatinine, Ser: 0.78 mg/dL (ref 0.44–1.00)
GFR, Estimated: >60 mL/min (ref >60)
Glucose, Bld: 118 mg/dL — ABNORMAL HIGH (ref 70–99)
Potassium: 3.5 mmol/L (ref 3.5–5.1)
Sodium: 119 mmol/L — CL (ref 135–145)
Total Bilirubin: 1.7 mg/dL — ABNORMAL HIGH (ref 0.3–1.2)
Total Protein: 7 g/dL (ref 6.5–8.1)

## 2020-09-09 LAB — ETHANOL: Alcohol, Ethyl (B): <10 mg/dL (ref <10)

## 2020-09-09 LAB — PROTIME-INR
INR: 1.2 (ref 0.8–1.2)
Prothrombin Time: 14.6 seconds (ref 11.4–15.2)

## 2020-09-09 LAB — RESP PANEL BY RT-PCR (FLU A&B, COVID) ARPGX2
Influenza A by PCR: NEGATIVE
Influenza B by PCR: NEGATIVE
SARS Coronavirus 2 by RT PCR: NEGATIVE

## 2020-09-09 LAB — ACETAMINOPHEN LEVEL: Acetaminophen (Tylenol), Serum: <10 ug/mL — ABNORMAL LOW (ref 10–30)

## 2020-09-09 LAB — LACTIC ACID, PLASMA: Lactic Acid, Venous: 2.3 mmol/L (ref 0.5–1.9)

## 2020-09-09 LAB — TROPONIN I (HIGH SENSITIVITY): Troponin I (High Sensitivity): 20 ng/L — ABNORMAL HIGH (ref <18)

## 2020-09-09 LAB — AMMONIA: Ammonia: 30 umol/L (ref 9–35)

## 2020-09-09 LAB — SALICYLATE LEVEL: Salicylate Lvl: <7 mg/dL — ABNORMAL LOW (ref 7.0–30.0)

## 2020-09-09 MED ORDER — LORAZEPAM 1 MG PO TABS: 0.0000 mg | ORAL_TABLET | Freq: Four times a day (QID) | ORAL | Status: DC

## 2020-09-09 MED ORDER — LACTATED RINGERS IV BOLUS
1000.0000 mL | Freq: Once | INTRAVENOUS | Status: AC
  Administered 2020-09-09: 1000 mL via INTRAVENOUS

## 2020-09-09 MED ORDER — LORAZEPAM 2 MG/ML IJ SOLN: 0.0000 mg | Freq: Two times a day (BID) | INTRAMUSCULAR | Status: DC

## 2020-09-09 MED ORDER — THIAMINE HCL 100 MG PO TABS: 100.0000 mg | ORAL_TABLET | Freq: Every day | ORAL | Status: DC

## 2020-09-09 MED ORDER — LORAZEPAM 2 MG/ML IJ SOLN
0.0000 mg | Freq: Four times a day (QID) | INTRAMUSCULAR | Status: DC
  Administered 2020-09-09 (×2): 2 mg via INTRAVENOUS
  Filled 2020-09-09: qty 2

## 2020-09-09 MED ORDER — LORAZEPAM 1 MG PO TABS: 0.0000 mg | ORAL_TABLET | Freq: Two times a day (BID) | ORAL | Status: DC

## 2020-09-09 MED ORDER — THIAMINE HCL 100 MG/ML IJ SOLN
100.0000 mg | Freq: Every day | INTRAMUSCULAR | Status: DC
  Administered 2020-09-10: 100 mg via INTRAVENOUS
  Filled 2020-09-09: qty 2

## 2020-09-09 NOTE — ED Provider Notes (Signed)
[place] EMERGENCY DEPARTMENT Provider Note  CSN: [id number] Arrival date & time: 09/09/20 2048    History Chief Complaint  Patient presents with  . Altered Mental Status    HPI  Karen Casey is a 65 y.o. female with history of alcohol abuse brought to the ED via EMS from home. She is not able to provide much history but her sister is at bedside and helps. Sister reports the patient has been having pretty bad vomiting and diarrhea for the last 2 weeks. She called her sister 2 days ago saying she was feeling better and wanted some gatorade. The sister went to her house but did not come in so as not to be exposed to her illness. Sister says she didn't look good that day but reports she was able to walk to the door and talk to her without difficulty on Sunday. Today the sister could not get her to answer the phone. A friend called to say the patient sounded confused on the phone. The patient's daughter went to the house to check on her and found her in the bed, covered in feces and incoherent and so EMS was called. The patient lives with her husband but they don't get along and keep to themselves, according to the sister. Patient is unable to provide any additional history. RN reports on arrival she had her eyes crusted shut, there was some old blood coming from her mouth, she was covered in dried feces and had some superficial excoriations on her back. She had been cleaned before my evaluation. Sister reports the patient is a heavy drinker and takes Xanax daily. She doesn't think the patient has been able to keep down alcohol the last few days and had been taking extra xanax pills when she was vomiting and has now run out of that too.    Past Medical History:  Diagnosis Date  . Alcohol abuse   . Allergy   . Anxiety   . Cataract 06/09/2012   Right eye and left eye  . Depression   . GERD (gastroesophageal reflux disease)   . 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  Seizures ([place])    xanax withdrawl- December 2013  . Urinary incontinence 06/09/2012    Past Surgical History:  Procedure Laterality Date  . APPENDECTOMY    . CATARACT EXTRACTION  06/09/2012   Left eye  . left shoulder dislocation  Sept 2011    Family History  Problem Relation Age of Onset  . Hypertension Mother   . Hyperlipidemia Mother   . Aneurysm Mother        Rupture - Cause of death  . Heart disease Father        MI - cause of death  . Depression Father   . Parkinsonism Father   . Colon cancer Neg Hx   . Esophageal cancer Neg Hx   . Rectal cancer Neg Hx   . Stomach cancer Neg Hx     Social History   Tobacco Use  . Smoking status: Current Every Day Smoker    Packs/day: 1.00    Years: 43.00    Pack years: 43.00    Types: Cigarettes  . Smokeless tobacco: Never Used  Vaping Use  . Vaping Use: Never used  Substance Use Topics  . Alcohol use: Yes    Alcohol/week: 3.0 standard drinks    Types: 3 Cans of beer per week    Comment: 2-3 times  .  Drug use: Yes    Types: Marijuana, Other-see comments    Comment: Past hx of benzo abuse--xanax     Home Medications Prior to Admission medications   Medication Sig Start Date End Date Taking? Authorizing Provider  ALPRAZolam Duanne Moron) 1 MG tablet Take 1 mg by mouth 4 (four) times daily as needed.  08/29/17   [provider]  azelastine (OPTIVAR) 0.05 % ophthalmic solution Place 1 drop into both eyes 2 (two) times daily. 07/15/20   [name], MD  citalopram (CELEXA) 40 MG tablet Take 1 tablet (40 mg total) by mouth daily. 05/31/18   Alveda Reasons, MD  esomeprazole (NEXIUM) 40 MG capsule Take 1 capsule (40 mg total) by mouth daily at 12 noon. 06/20/20   [name], MD  fluticasone (FLONASE) 50 MCG/ACT nasal spray SHAKE LIQUID AND USE 2 SPRAYS IN EACH NOSTRIL DAILY 07/15/20   [name], MD  gabapentin (NEURONTIN) 600 MG tablet Take 2 tablets (1,200 mg total) by mouth 3 (three) times daily. 02/20/18    Alveda Reasons, MD  ibuprofen (ADVIL) 600 MG tablet Take 1 tablet (600 mg total) by mouth 3 (three) times daily with meals as needed. 07/27/19   Caccavale, Sophia, PA-C  Multiple Vitamins-Minerals (CENTRUM SILVER 50+WOMEN PO) Take 1 tablet by mouth daily.    [provider]  polyvinyl alcohol (LIQUITEARS) 1.4 % ophthalmic solution Place 3 drops into both eyes as needed for dry eyes. Patient not taking: Reported on 06/20/2020 03/04/20   Doristine Mango L, DO  Probiotic Product (PROBIOTIC PO) Take 1 capsule by mouth daily.    [provider]  QUEtiapine (SEROQUEL) 100 MG tablet Take 150 mg by mouth at bedtime. Exttended release version    [provider]     Allergies    Patient has no known allergies.   Review of Systems   Review of Systems Unable to assess due to mental status.   Physical Exam BP (!) 176/89 (BP Location: Right Arm)   Pulse 66   Temp 98.2 F (36.8 C) (Oral)   Resp (!) 27   SpO2 97%   Physical Exam Vitals and nursing note reviewed.  Constitutional:      Appearance: Normal appearance.  HENT:     Head: Normocephalic and atraumatic.     Nose: Nose normal.     Mouth/Throat:     Mouth: Mucous membranes are dry.     Comments: L tongue contusion Eyes:     Extraocular Movements: Extraocular movements intact.     Pupils: Pupils are equal, round, and reactive to light.     Comments: Bilateral conjunctival injection  Cardiovascular:     Rate and Rhythm: Tachycardia present. Rhythm irregular.  Pulmonary:     Effort: Pulmonary effort is normal.     Breath sounds: Normal breath sounds.  Abdominal:     General: Abdomen is flat.     Palpations: Abdomen is soft.     Tenderness: There is no abdominal tenderness.  Musculoskeletal:        General: No swelling. Normal range of motion.     Cervical back: Neck supple. No rigidity.  Skin:    General: Skin is warm and dry.     Findings: Rash (excoriations on back, red punctate rash on L hip)  present.  Neurological:     Mental Status: She is alert. She is disoriented.     Cranial Nerves: No cranial nerve deficit.     Motor: No weakness.  Psychiatric:     Comments: Incoherent speech      ED Results / Procedures / Treatments   Labs (all labs ordered are listed, but only abnormal results are displayed) Labs Reviewed  CBG MONITORING, ED - Abnormal; Notable for the following components:      Result Value   Glucose-Capillary 139 (*)    All other components within normal limits  RESP PANEL BY RT-PCR (FLU A&B, COVID) ARPGX2  CBC WITH DIFFERENTIAL/PLATELET  COMPREHENSIVE METABOLIC PANEL  URINALYSIS, ROUTINE W REFLEX MICROSCOPIC  AMMONIA  LACTIC ACID, PLASMA  PROTIME-INR  ETHANOL  RAPID URINE DRUG SCREEN, HOSP PERFORMED  TROPONIN I (HIGH SENSITIVITY)    EKG EKG Interpretation  Date/Time:  Tuesday September 09 2020 23:01:07 EDT Ventricular Rate:  134 PR Interval:  148 QRS Duration: 76 QT Interval:  298 QTC Calculation: 445 R Axis:   40 Text Interpretation: Sinus tachycardia Probable left atrial enlargement Minimal ST depression, lateral leads Since last tracing Sinus tachycardia has replaced Atrial fibrillation with rapid ventricular response Confirmed by Calvert Cantor [phone]) on 09/09/2020 11:15:11 PM   Radiology DG Chest Port 1 View  Result Date: 09/09/2020 CLINICAL DATA:  Altered mental status, tachycardia, bloody sputum around the mouth EXAM: PORTABLE CHEST 1 VIEW COMPARISON:  CT 07/14/2020, radiograph 02/04/2017 FINDINGS: Diffuse airways thickening and coarse reticular opacities in the lungs, some of which could be attributable atelectasis though a superimposed atypical infection is not fully excluded. No convincing features of edema with normally distributed pulmonary vascularity. No pneumothorax or visible effusion. The cardiomediastinal contours are unremarkable. No other acute osseous or soft tissue abnormality. Telemetry leads overlie the chest. IMPRESSION:  Diffuse airways thickening and coarse reticular opacities in the lungs, some of which could be attributable atelectasis though a superimposed atypical infection is not fully excluded. Electronically Signed   By: Lovena Le M.D.   On: 09/09/2020 21:53    Procedures .Critical Care Performed by: Truddie Hidden, MD Authorized by: Truddie Hidden, MD   Critical care provider statement:    Critical care time (minutes):  75   Critical care time was exclusive of:  Separately billable procedures and treating other patients   Critical care was necessary to treat or prevent imminent or life-threatening deterioration of the following conditions:  CNS failure or compromise and metabolic crisis   Critical care was time spent personally by me on the following activities:  Discussions with consultants, evaluation of patient's response to treatment, examination of patient, ordering and performing treatments and interventions, ordering and review of laboratory studies, ordering and review of radiographic studies, pulse oximetry, re-evaluation of patient's condition, obtaining history from patient or surrogate and review of old charts   Care discussed with: admitting provider   Procedure Name: Intubation Date/Time: 09/09/2020 11:19 PM Performed by: Truddie Hidden, MD Pre-anesthesia Checklist: Patient identified, Patient being monitored, Emergency Drugs available, Timeout performed and Suction available Oxygen Delivery Method: Ambu bag Preoxygenation: Pre-oxygenation with 100% oxygen Induction Type: Rapid sequence Ventilation: Mask ventilation without difficulty Laryngoscope Size: Glidescope and 4 Grade View: Grade II Tube size: 7.5 mm Number of attempts: 1 Placement Confirmation: ETT inserted through vocal cords under direct vision,  CO2 detector and Breath sounds checked- equal and bilateral Secured at: 23 cm Tube secured with: ETT holder Dental Injury: Teeth and Oropharynx as per  pre-operative assessment        Medications Ordered in the ED Medications  lactated ringers bolus 1,000 mL (has no administration in time range)  LORazepam (ATIVAN) injection  0-4 mg (has no administration in time range)    Or  LORazepam (ATIVAN) tablet 0-4 mg (has no administration in time range)  LORazepam (ATIVAN) injection 0-4 mg (has no administration in time range)    Or  LORazepam (ATIVAN) tablet 0-4 mg (has no administration in time range)  thiamine tablet 100 mg (has no administration in time range)    Or  thiamine (B-1) injection 100 mg (has no administration in time range)     MDM Rules/Calculators/A&P MDM Patient with confusion in setting of GI illness and suspected alcohol and/or benzo withdrawal seizure. EKG with afib which is new for patient. No history of same per sister. She was given Versed enroute for agitation and seems to be a little better. Will check labs, begin IVF and CIWA protocol. Not febrile or hypotensive here. Doubt sepsis although this was the working diagnosis with EMS.  ED Course  I have reviewed the triage vital signs and the nursing notes.  Pertinent labs & imaging results that were available during my care of the patient were reviewed by me and considered in my medical decision making (see chart for details).  Clinical Course as of 09/09/20 2319  Tue Sep 09, 2020  2234 CBC with elevated WBC and Hgb, probably some degree of hemoconcentration but given WBC and tachycardia will add on blood cultures in case an infectious source is identified.  [CS]  2302 Called to CT where patient had another seizure. On my arrival she was somnolent bloody saliva coming from her mouth and sonorous respirations. She had her jaw clenched and eyes deviated to the right. Given Ativan 2mg  IV and returned to the exam room where she continued to have sonorous respirations. Will intubate for airway protection.  [CS]  2304 Patient intubated without difficulty. Remains in afib  with RVR, cardizem started, additional ativan given for withdrawal. ICU and Neuro paged.  [CS]  2307 CMP shows marked hyponatremia/hypochloremia, otherwise no significant findings. APAP, ASA and EtOH are neg. Lactic acid and Trop are only mildly elevated. Ammonia and coags are normal. No signs of acute or chronic liver disease.  [CS]  2309 Note that documented HR are not accurate. She has been 130-180 while in the ED.  [CS]  2312 Spoke with ICU who will come evaluate the patient. Sister is aware of the situation and reasoning for intubation.  [CS]  2315 Spoke with Dr. Leonel Ramsay who will come evaluate the patient and arrange for continuous EEG. Does not recommend Keppra or other anti-epileptics at this time.  [CS]    Clinical Course User Index [CS] Truddie Hidden, MD    Final Clinical Impression(s) / ED Diagnoses Final diagnoses:  Alcohol withdrawal seizure with delirium [facility])  Status epilepticus ([place])  Hyponatremia    Rx / DC Orders ED Discharge Orders    None       Truddie Hidden, MD 09/09/20 2338

## 2020-09-09 NOTE — ED Triage Notes (Addendum)
Pt arrived via [facility] for sepsis, AMS. EMS called by pt daughter, found pt laying in feces in bed, altered, with wet cough, eye drainage, bloody sputum around mouth and rash on upper left leg. Husband reported that pt stopped drinking 1 week ago after chronic alcoholism and became altered 3 days ago. Pt aggressive and altered with EMS, 4mg  of versed given in route for agitation per Sabra Heck MD.  No significant PHM reported, only medication pt takes is Xanax for anxiety. Normally A&Ox4.  18g LH RA SAT 92  RR 30 EtCO2 20 BP 144/12 T 98.7

## 2020-09-09 NOTE — ED Notes (Addendum)
2030 Seizure in CT 2038 Ativan 2 mg 2245 Staff at bedside for intubation 2250 Etomidate 30mg  2251 Roc 80mg  2252 ET placed, 71/2, 21, + color change 2254 Ativan 2mg  2255 Propofol 36mcg/hr 2258 Diltiazem 20mg  bolus, 5mg /hr 2259 OG placement 2308 14 French Foley placed  Staff MD Karle Starch RT Paisley [place] RN Sophie EMT Roque Lias

## 2020-09-10 ENCOUNTER — Inpatient Hospital Stay: Payer: Self-pay

## 2020-09-10 ENCOUNTER — Inpatient Hospital Stay (HOSPITAL_COMMUNITY): Payer: 59

## 2020-09-10 DIAGNOSIS — I4891 Unspecified atrial fibrillation: Secondary | ICD-10-CM | POA: Diagnosis not present

## 2020-09-10 DIAGNOSIS — R6521 Severe sepsis with septic shock: Secondary | ICD-10-CM | POA: Diagnosis not present

## 2020-09-10 DIAGNOSIS — K219 Gastro-esophageal reflux disease without esophagitis: Secondary | ICD-10-CM | POA: Diagnosis present

## 2020-09-10 DIAGNOSIS — J9811 Atelectasis: Secondary | ICD-10-CM | POA: Diagnosis present

## 2020-09-10 DIAGNOSIS — E861 Hypovolemia: Secondary | ICD-10-CM

## 2020-09-10 DIAGNOSIS — I952 Hypotension due to drugs: Secondary | ICD-10-CM | POA: Diagnosis not present

## 2020-09-10 DIAGNOSIS — F13239 Sedative, hypnotic or anxiolytic dependence with withdrawal, unspecified: Secondary | ICD-10-CM | POA: Diagnosis present

## 2020-09-10 DIAGNOSIS — F32A Depression, unspecified: Secondary | ICD-10-CM | POA: Diagnosis present

## 2020-09-10 DIAGNOSIS — E871 Hypo-osmolality and hyponatremia: Secondary | ICD-10-CM | POA: Diagnosis present

## 2020-09-10 DIAGNOSIS — I9589 Other hypotension: Secondary | ICD-10-CM

## 2020-09-10 DIAGNOSIS — R0689 Other abnormalities of breathing: Secondary | ICD-10-CM | POA: Diagnosis not present

## 2020-09-10 DIAGNOSIS — F10939 Alcohol use, unspecified with withdrawal, unspecified: Secondary | ICD-10-CM | POA: Diagnosis present

## 2020-09-10 DIAGNOSIS — G9341 Metabolic encephalopathy: Secondary | ICD-10-CM | POA: Diagnosis present

## 2020-09-10 DIAGNOSIS — A419 Sepsis, unspecified organism: Secondary | ICD-10-CM | POA: Diagnosis not present

## 2020-09-10 DIAGNOSIS — J440 Chronic obstructive pulmonary disease with acute lower respiratory infection: Secondary | ICD-10-CM | POA: Diagnosis present

## 2020-09-10 DIAGNOSIS — F419 Anxiety disorder, unspecified: Secondary | ICD-10-CM | POA: Diagnosis present

## 2020-09-10 DIAGNOSIS — E860 Dehydration: Secondary | ICD-10-CM | POA: Diagnosis present

## 2020-09-10 DIAGNOSIS — J9601 Acute respiratory failure with hypoxia: Secondary | ICD-10-CM

## 2020-09-10 DIAGNOSIS — Z79899 Other long term (current) drug therapy: Secondary | ICD-10-CM | POA: Diagnosis not present

## 2020-09-10 DIAGNOSIS — R1310 Dysphagia, unspecified: Secondary | ICD-10-CM | POA: Diagnosis present

## 2020-09-10 DIAGNOSIS — F17210 Nicotine dependence, cigarettes, uncomplicated: Secondary | ICD-10-CM | POA: Diagnosis present

## 2020-09-10 DIAGNOSIS — F10239 Alcohol dependence with withdrawal, unspecified: Secondary | ICD-10-CM | POA: Diagnosis present

## 2020-09-10 DIAGNOSIS — G40901 Epilepsy, unspecified, not intractable, with status epilepticus: Secondary | ICD-10-CM | POA: Diagnosis not present

## 2020-09-10 DIAGNOSIS — J9602 Acute respiratory failure with hypercapnia: Secondary | ICD-10-CM | POA: Diagnosis present

## 2020-09-10 DIAGNOSIS — J811 Chronic pulmonary edema: Secondary | ICD-10-CM | POA: Diagnosis present

## 2020-09-10 DIAGNOSIS — I4819 Other persistent atrial fibrillation: Secondary | ICD-10-CM | POA: Diagnosis present

## 2020-09-10 DIAGNOSIS — F10231 Alcohol dependence with withdrawal delirium: Secondary | ICD-10-CM | POA: Diagnosis present

## 2020-09-10 DIAGNOSIS — R569 Unspecified convulsions: Secondary | ICD-10-CM | POA: Diagnosis not present

## 2020-09-10 DIAGNOSIS — Z818 Family history of other mental and behavioral disorders: Secondary | ICD-10-CM | POA: Diagnosis not present

## 2020-09-10 DIAGNOSIS — E8771 Transfusion associated circulatory overload: Secondary | ICD-10-CM | POA: Diagnosis not present

## 2020-09-10 DIAGNOSIS — R4182 Altered mental status, unspecified: Secondary | ICD-10-CM | POA: Diagnosis not present

## 2020-09-10 DIAGNOSIS — Z20822 Contact with and (suspected) exposure to covid-19: Secondary | ICD-10-CM | POA: Diagnosis present

## 2020-09-10 DIAGNOSIS — I480 Paroxysmal atrial fibrillation: Secondary | ICD-10-CM | POA: Diagnosis not present

## 2020-09-10 DIAGNOSIS — E876 Hypokalemia: Secondary | ICD-10-CM | POA: Diagnosis present

## 2020-09-10 DIAGNOSIS — N179 Acute kidney failure, unspecified: Secondary | ICD-10-CM | POA: Diagnosis present

## 2020-09-10 LAB — COMPREHENSIVE METABOLIC PANEL
ALT: 32 U/L (ref 0–44)
AST: 45 U/L — ABNORMAL HIGH (ref 15–41)
Albumin: 3.8 g/dL (ref 3.5–5.0)
Alkaline Phosphatase: 68 U/L (ref 38–126)
Anion gap: 10 (ref 5–15)
BUN: 6 mg/dL — ABNORMAL LOW (ref 8–23)
CO2: 28 mmol/L (ref 22–32)
Calcium: 9.3 mg/dL (ref 8.9–10.3)
Chloride: 82 mmol/L — ABNORMAL LOW (ref 98–111)
Creatinine, Ser: 1.08 mg/dL — ABNORMAL HIGH (ref 0.44–1.00)
GFR, Estimated: 58 mL/min — ABNORMAL LOW (ref >60)
Glucose, Bld: 116 mg/dL — ABNORMAL HIGH (ref 70–99)
Potassium: 2.9 mmol/L — ABNORMAL LOW (ref 3.5–5.1)
Sodium: 120 mmol/L — ABNORMAL LOW (ref 135–145)
Total Bilirubin: 1.6 mg/dL — ABNORMAL HIGH (ref 0.3–1.2)
Total Protein: 7 g/dL (ref 6.5–8.1)

## 2020-09-10 LAB — I-STAT ARTERIAL BLOOD GAS, ED
Acid-Base Excess: 3 mmol/L — ABNORMAL HIGH (ref 0.0–2.0)
Bicarbonate: 29.4 mmol/L — ABNORMAL HIGH (ref 20.0–28.0)
Calcium, Ion: 1.17 mmol/L (ref 1.15–1.40)
HCT: 50 % — ABNORMAL HIGH (ref 36.0–46.0)
Hemoglobin: 17 g/dL — ABNORMAL HIGH (ref 12.0–15.0)
O2 Saturation: 100 %
Patient temperature: 100.6
Potassium: 2.6 mmol/L — CL (ref 3.5–5.1)
Sodium: 120 mmol/L — ABNORMAL LOW (ref 135–145)
TCO2: 31 mmol/L (ref 22–32)
pCO2 arterial: 49.9 mmHg — ABNORMAL HIGH (ref 32.0–48.0)
pH, Arterial: 7.382 (ref 7.350–7.450)
pO2, Arterial: 248 mmHg — ABNORMAL HIGH (ref 83.0–108.0)

## 2020-09-10 LAB — URINALYSIS, ROUTINE W REFLEX MICROSCOPIC
Bilirubin Urine: NEGATIVE
Glucose, UA: 50 mg/dL — AB
Ketones, ur: 20 mg/dL — AB
Leukocytes,Ua: NEGATIVE
Nitrite: NEGATIVE
Protein, ur: 100 mg/dL — AB
Specific Gravity, Urine: 1.014 (ref 1.005–1.030)
pH: 6 (ref 5.0–8.0)

## 2020-09-10 LAB — SODIUM
Sodium: 124 mmol/L — ABNORMAL LOW (ref 135–145)
Sodium: 124 mmol/L — ABNORMAL LOW (ref 135–145)
Sodium: 126 mmol/L — ABNORMAL LOW (ref 135–145)

## 2020-09-10 LAB — ECHOCARDIOGRAM COMPLETE
Area-P 1/2: 5.97 cm2
Height: 66 in
S' Lateral: 2.7 cm
Weight: 3135.82 oz

## 2020-09-10 LAB — BASIC METABOLIC PANEL
Anion gap: 10 (ref 5–15)
BUN: 7 mg/dL — ABNORMAL LOW (ref 8–23)
CO2: 21 mmol/L — ABNORMAL LOW (ref 22–32)
Calcium: 8.2 mg/dL — ABNORMAL LOW (ref 8.9–10.3)
Chloride: 92 mmol/L — ABNORMAL LOW (ref 98–111)
Creatinine, Ser: 1.05 mg/dL — ABNORMAL HIGH (ref 0.44–1.00)
GFR, Estimated: >60 mL/min (ref >60)
Glucose, Bld: 120 mg/dL — ABNORMAL HIGH (ref 70–99)
Potassium: 3.8 mmol/L (ref 3.5–5.1)
Sodium: 123 mmol/L — ABNORMAL LOW (ref 135–145)

## 2020-09-10 LAB — PHOSPHORUS: Phosphorus: 3.8 mg/dL (ref 2.5–4.6)

## 2020-09-10 LAB — GLUCOSE, CAPILLARY
Glucose-Capillary: 133 mg/dL — ABNORMAL HIGH (ref 70–99)
Glucose-Capillary: 113 mg/dL — ABNORMAL HIGH (ref 70–99)
Glucose-Capillary: 107 mg/dL — ABNORMAL HIGH (ref 70–99)
Glucose-Capillary: 94 mg/dL (ref 70–99)
Glucose-Capillary: 105 mg/dL — ABNORMAL HIGH (ref 70–99)
Glucose-Capillary: 119 mg/dL — ABNORMAL HIGH (ref 70–99)

## 2020-09-10 LAB — MAGNESIUM: Magnesium: 1.5 mg/dL — ABNORMAL LOW (ref 1.7–2.4)

## 2020-09-10 LAB — CBC
HCT: 47.5 % — ABNORMAL HIGH (ref 36.0–46.0)
Hemoglobin: 17 g/dL — ABNORMAL HIGH (ref 12.0–15.0)
MCH: 32.9 pg (ref 26.0–34.0)
MCHC: 35.8 g/dL (ref 30.0–36.0)
MCV: 91.9 fL (ref 80.0–100.0)
Platelets: 199 10*3/uL (ref 150–400)
RBC: 5.17 MIL/uL — ABNORMAL HIGH (ref 3.87–5.11)
RDW: 12.2 % (ref 11.5–15.5)
WBC: 19.2 10*3/uL — ABNORMAL HIGH (ref 4.0–10.5)
nRBC: 0 % (ref 0.0–0.2)

## 2020-09-10 LAB — LACTIC ACID, PLASMA: Lactic Acid, Venous: 1.7 mmol/L (ref 0.5–1.9)

## 2020-09-10 LAB — RAPID URINE DRUG SCREEN, HOSP PERFORMED
Amphetamines: NOT DETECTED
Barbiturates: NOT DETECTED
Benzodiazepines: POSITIVE — AB
Cocaine: NOT DETECTED
Opiates: NOT DETECTED
Tetrahydrocannabinol: POSITIVE — AB

## 2020-09-10 LAB — TROPONIN I (HIGH SENSITIVITY): Troponin I (High Sensitivity): 24 ng/L — ABNORMAL HIGH (ref <18)

## 2020-09-10 LAB — HIV ANTIBODY (ROUTINE TESTING W REFLEX): HIV Screen 4th Generation wRfx: NONREACTIVE

## 2020-09-10 LAB — OSMOLALITY: Osmolality: 256 mOsm/kg — ABNORMAL LOW (ref 275–295)

## 2020-09-10 MED ORDER — CITALOPRAM HYDROBROMIDE 40 MG PO TABS
40.0000 mg | ORAL_TABLET | Freq: Every day | ORAL | Status: DC
  Administered 2020-09-10 – 2020-09-13 (×4): 40 mg
  Filled 2020-09-10 (×4): qty 1

## 2020-09-10 MED ORDER — VITAL HIGH PROTEIN PO LIQD
1000.0000 mL | ORAL | Status: DC
  Administered 2020-09-10 – 2020-09-11 (×2): 1000 mL

## 2020-09-10 MED ORDER — HEPARIN SODIUM (PORCINE) 5000 UNIT/ML IJ SOLN
5000.0000 [IU] | Freq: Three times a day (TID) | INTRAMUSCULAR | Status: DC
  Administered 2020-09-11 – 2020-09-13 (×7): 5000 [IU] via SUBCUTANEOUS
  Filled 2020-09-10 (×7): qty 1

## 2020-09-10 MED ORDER — FOLIC ACID 1 MG PO TABS
1.0000 mg | ORAL_TABLET | Freq: Every day | ORAL | Status: DC
  Administered 2020-09-10 – 2020-09-13 (×4): 1 mg
  Filled 2020-09-10 (×4): qty 1

## 2020-09-10 MED ORDER — CHLORHEXIDINE GLUCONATE CLOTH 2 % EX PADS
6.0000 | MEDICATED_PAD | Freq: Every day | CUTANEOUS | Status: DC
  Administered 2020-09-10 – 2020-09-22 (×15): 6 via TOPICAL

## 2020-09-10 MED ORDER — PHENYLEPHRINE HCL-NACL 10-0.9 MG/250ML-% IV SOLN
25.0000 ug/min | INTRAVENOUS | Status: DC
  Administered 2020-09-10: 25 ug/min via INTRAVENOUS
  Administered 2020-09-10: 45 ug/min via INTRAVENOUS
  Administered 2020-09-10: 85 ug/min via INTRAVENOUS
  Administered 2020-09-10: 25 ug/min via INTRAVENOUS
  Administered 2020-09-10: 60 ug/min via INTRAVENOUS
  Administered 2020-09-10: 45 ug/min via INTRAVENOUS
  Administered 2020-09-11: 180 ug/min via INTRAVENOUS
  Administered 2020-09-11: 160 ug/min via INTRAVENOUS
  Filled 2020-09-10 (×8): qty 250

## 2020-09-10 MED ORDER — ORAL CARE MOUTH RINSE
15.0000 mL | OROMUCOSAL | Status: DC
  Administered 2020-09-10 – 2020-09-12 (×22): 15 mL via OROMUCOSAL

## 2020-09-10 MED ORDER — PROPOFOL 1000 MG/100ML IV EMUL: 20.0000 ug/kg/min | INTRAVENOUS | Status: DC

## 2020-09-10 MED ORDER — CLONAZEPAM 1 MG PO TABS
1.0000 mg | ORAL_TABLET | Freq: Three times a day (TID) | ORAL | Status: DC
  Administered 2020-09-10 – 2020-09-11 (×4): 1 mg
  Filled 2020-09-10 (×4): qty 1

## 2020-09-10 MED ORDER — FENTANYL CITRATE (PF) 100 MCG/2ML IJ SOLN
50.0000 ug | INTRAMUSCULAR | Status: AC | PRN
  Administered 2020-09-10 (×3): 50 ug via INTRAVENOUS
  Filled 2020-09-10 (×2): qty 2

## 2020-09-10 MED ORDER — MAGNESIUM SULFATE 4 GM/100ML IV SOLN
4.0000 g | Freq: Once | INTRAVENOUS | Status: AC
  Administered 2020-09-10: 4 g via INTRAVENOUS
  Filled 2020-09-10: qty 100

## 2020-09-10 MED ORDER — FENTANYL CITRATE (PF) 100 MCG/2ML IJ SOLN
50.0000 ug | INTRAMUSCULAR | Status: DC | PRN
Start: 2020-09-10 — End: 2020-09-12
  Administered 2020-09-10: 100 ug via INTRAVENOUS
  Administered 2020-09-10: 50 ug via INTRAVENOUS
  Administered 2020-09-10 (×3): 100 ug via INTRAVENOUS
  Administered 2020-09-10: 50 ug via INTRAVENOUS
  Administered 2020-09-10: 100 ug via INTRAVENOUS
  Administered 2020-09-11 (×3): 50 ug via INTRAVENOUS
  Administered 2020-09-11: 100 ug via INTRAVENOUS
  Filled 2020-09-10 (×3): qty 2

## 2020-09-10 MED ORDER — POTASSIUM CHLORIDE 10 MEQ/100ML IV SOLN
10.0000 meq | INTRAVENOUS | Status: AC
  Administered 2020-09-10 (×4): 10 meq via INTRAVENOUS
  Filled 2020-09-10 (×4): qty 100

## 2020-09-10 MED ORDER — SODIUM CHLORIDE 0.9 % IV BOLUS
500.0000 mL | Freq: Once | INTRAVENOUS | Status: AC
  Administered 2020-09-10: 500 mL via INTRAVENOUS

## 2020-09-10 MED ORDER — GABAPENTIN 250 MG/5ML PO SOLN
1200.0000 mg | Freq: Three times a day (TID) | ORAL | Status: DC
  Filled 2020-09-10 (×2): qty 24

## 2020-09-10 MED ORDER — PANTOPRAZOLE SODIUM 40 MG IV SOLR
40.0000 mg | Freq: Every day | INTRAVENOUS | Status: DC
  Administered 2020-09-10: 40 mg via INTRAVENOUS
  Filled 2020-09-10: qty 40

## 2020-09-10 MED ORDER — PROPOFOL 1000 MG/100ML IV EMUL
5.0000 ug/kg/min | INTRAVENOUS | Status: DC
  Administered 2020-09-09: 20 ug/kg/min via INTRAVENOUS

## 2020-09-10 MED ORDER — PANTOPRAZOLE SODIUM 40 MG PO PACK
40.0000 mg | PACK | Freq: Every day | ORAL | Status: DC
  Administered 2020-09-10 – 2020-09-11 (×2): 40 mg
  Filled 2020-09-10 (×2): qty 20

## 2020-09-10 MED ORDER — DILTIAZEM HCL-DEXTROSE 125-5 MG/125ML-% IV SOLN (PREMIX)
5.0000 mg/h | INTRAVENOUS | Status: DC
  Administered 2020-09-10: 5 mg/h via INTRAVENOUS

## 2020-09-10 MED ORDER — SODIUM CHLORIDE 0.9 % IV SOLN: INTRAVENOUS | Status: AC

## 2020-09-10 MED ORDER — POLYETHYLENE GLYCOL 3350 17 G PO PACK: 17.0000 g | PACK | Freq: Every day | ORAL | Status: DC

## 2020-09-10 MED ORDER — FOLIC ACID 5 MG/ML IJ SOLN
1.0000 mg | Freq: Every day | INTRAMUSCULAR | Status: DC
  Filled 2020-09-10: qty 0.2

## 2020-09-10 MED ORDER — PERFLUTREN LIPID MICROSPHERE
1.0000 mL | INTRAVENOUS | Status: AC | PRN
  Administered 2020-09-10: 2 mL via INTRAVENOUS
  Filled 2020-09-10: qty 10

## 2020-09-10 MED ORDER — DOCUSATE SODIUM 50 MG/5ML PO LIQD
100.0000 mg | Freq: Two times a day (BID) | ORAL | Status: DC
  Administered 2020-09-10 – 2020-09-11 (×4): 100 mg
  Filled 2020-09-10 (×4): qty 10

## 2020-09-10 MED ORDER — SODIUM CHLORIDE 0.9 % IV SOLN
2.0000 g | INTRAVENOUS | Status: AC
  Administered 2020-09-10 – 2020-09-14 (×5): 2 g via INTRAVENOUS
  Filled 2020-09-10 (×5): qty 20

## 2020-09-10 MED ORDER — DEXMEDETOMIDINE HCL IN NACL 400 MCG/100ML IV SOLN
0.0000 ug/kg/h | INTRAVENOUS | Status: AC
  Administered 2020-09-10 (×3): 0.6 ug/kg/h via INTRAVENOUS
  Administered 2020-09-10 – 2020-09-11 (×2): 0.4 ug/kg/h via INTRAVENOUS
  Administered 2020-09-12 (×2): 0.6 ug/kg/h via INTRAVENOUS
  Filled 2020-09-10 (×7): qty 100

## 2020-09-10 MED ORDER — POTASSIUM CHLORIDE 20 MEQ PO PACK
40.0000 meq | PACK | Freq: Once | ORAL | Status: AC
  Administered 2020-09-10: 40 meq
  Filled 2020-09-10: qty 2

## 2020-09-10 MED ORDER — PROSOURCE TF PO LIQD
45.0000 mL | Freq: Three times a day (TID) | ORAL | Status: DC
  Administered 2020-09-10 – 2020-09-11 (×5): 45 mL
  Filled 2020-09-10 (×5): qty 45

## 2020-09-10 MED ORDER — POLYETHYLENE GLYCOL 3350 17 G PO PACK
17.0000 g | PACK | Freq: Every day | ORAL | Status: DC | PRN
  Administered 2020-09-11: 17 g via ORAL
  Filled 2020-09-10: qty 1

## 2020-09-10 MED ORDER — GERHARDT'S BUTT CREAM
TOPICAL_CREAM | Freq: Four times a day (QID) | CUTANEOUS | Status: DC
  Administered 2020-09-10 – 2020-09-18 (×5): 1 via TOPICAL
  Filled 2020-09-10 (×4): qty 1

## 2020-09-10 MED ORDER — CLONAZEPAM 1 MG PO TABS
1.0000 mg | ORAL_TABLET | Freq: Three times a day (TID) | ORAL | Status: DC
Start: 2020-09-10 — End: 2020-09-10

## 2020-09-10 MED ORDER — SODIUM CHLORIDE 0.9 % IV SOLN
250.0000 mL | INTRAVENOUS | Status: DC
  Administered 2020-09-10 – 2020-09-16 (×4): 250 mL via INTRAVENOUS

## 2020-09-10 MED ORDER — SODIUM CHLORIDE 0.9% FLUSH
10.0000 mL | Freq: Two times a day (BID) | INTRAVENOUS | Status: DC
  Administered 2020-09-10 – 2020-09-15 (×9): 10 mL
  Administered 2020-09-16: 20 mL
  Administered 2020-09-16 – 2020-09-17 (×3): 10 mL
  Administered 2020-09-18: 20 mL
  Administered 2020-09-18: 10 mL
  Administered 2020-09-19: 20 mL
  Administered 2020-09-19 – 2020-09-22 (×5): 10 mL

## 2020-09-10 MED ORDER — SODIUM CHLORIDE 0.9 % IV SOLN: 1.0000 mg | Freq: Every day | INTRAVENOUS | Status: DC

## 2020-09-10 MED ORDER — FENTANYL 2500MCG IN NS 250ML (10MCG/ML) PREMIX INFUSION
0.0000 ug/h | INTRAVENOUS | Status: DC
  Administered 2020-09-10: 25 ug/h via INTRAVENOUS
  Administered 2020-09-11: 75 ug/h via INTRAVENOUS
  Filled 2020-09-10 (×2): qty 250

## 2020-09-10 MED ORDER — GABAPENTIN 250 MG/5ML PO SOLN
600.0000 mg | Freq: Three times a day (TID) | ORAL | Status: DC
  Administered 2020-09-10 – 2020-09-11 (×4): 600 mg
  Filled 2020-09-10 (×6): qty 12

## 2020-09-10 MED ORDER — DOCUSATE SODIUM 100 MG PO CAPS: 100.0000 mg | ORAL_CAPSULE | Freq: Two times a day (BID) | ORAL | Status: DC | PRN

## 2020-09-10 MED ORDER — DILTIAZEM LOAD VIA INFUSION
20.0000 mg | Freq: Once | INTRAVENOUS | Status: AC
  Administered 2020-09-10: 20 mg via INTRAVENOUS

## 2020-09-10 MED ORDER — SODIUM CHLORIDE 0.9% FLUSH: 10.0000 mL | INTRAVENOUS | Status: DC | PRN

## 2020-09-10 MED ORDER — QUETIAPINE FUMARATE 50 MG PO TABS
150.0000 mg | ORAL_TABLET | Freq: Every day | ORAL | Status: DC
  Administered 2020-09-10 – 2020-09-11 (×2): 150 mg
  Filled 2020-09-10 (×2): qty 1

## 2020-09-10 MED ORDER — SODIUM CHLORIDE 0.9 % IV BOLUS
1000.0000 mL | Freq: Once | INTRAVENOUS | Status: AC
  Administered 2020-09-10: 1000 mL via INTRAVENOUS

## 2020-09-10 MED ORDER — THIAMINE HCL 100 MG PO TABS
100.0000 mg | ORAL_TABLET | Freq: Every day | ORAL | Status: DC
  Administered 2020-09-10 – 2020-09-13 (×4): 100 mg
  Filled 2020-09-10 (×4): qty 1

## 2020-09-10 MED ORDER — CHLORHEXIDINE GLUCONATE 0.12% ORAL RINSE (MEDLINE KIT)
15.0000 mL | Freq: Two times a day (BID) | OROMUCOSAL | Status: DC
  Administered 2020-09-10 – 2020-09-12 (×5): 15 mL via OROMUCOSAL

## 2020-09-10 MED ORDER — LORAZEPAM 2 MG/ML IJ SOLN
1.0000 mg | INTRAMUSCULAR | Status: DC | PRN
  Administered 2020-09-14 – 2020-09-15 (×5): 2 mg via INTRAVENOUS
  Filled 2020-09-10 (×6): qty 1

## 2020-09-10 NOTE — Progress Notes (Signed)
[place] Progress Note Patient Name: Karen Casey DOB: [birth year] MRN: [id number]   Date of Service  09/10/2020  HPI/Events of Note  Agitation - Request for bilateral soft wrist restraints.   eICU Interventions  Will order bilateral soft wrist restraints X 6 hours.      Intervention Category Major Interventions: Delirium, psychosis, severe agitation - evaluation and management  Yvonnie Schinke Eugene 09/10/2020, 3:11 AM

## 2020-09-10 NOTE — Progress Notes (Signed)
[name] Progress Note Patient Name: Karen Casey DOB: 1956/03/17 MRN: [id number]   Date of Service  09/10/2020  HPI/Events of Note  Hypotension - BP = 63/48 with MAP = 54. Bedside nurse has already decreased sedation IV infusions.   eICU Interventions  Plan: 1. Bolus with 0.9 NaCl 1 liter IV over 1 hour now. 2. Phenylephrine iV infusion via PIV. Titrate to MAP >= 65.      Intervention Category Major Interventions: Hypotension - evaluation and management  Avyukt Cimo Eugene 09/10/2020, 4:11 AM

## 2020-09-10 NOTE — Progress Notes (Signed)
NAME:  Karen Casey, MRN:  [id number], DOB:  01-08-1956, LOS: 0 ADMISSION DATE:  09/09/2020, CONSULTATION DATE:  09/09/2020 REFERRING MD:  Calvert Cantor CHIEF COMPLAINT:  AMS, seizure, concern for EtOH withdrawal   History of Present Illness:  65 year old female with PMHx significant for depression/anxiety (chronic Xanax use with history of seizure 2/2 withdrawal 2013), EtOH abuse (drinks estimated 6-10 beers/day) and tobacco abuse (43 pack-years) who presented to [facility] ED via EMS 3/29 after being found altered at home.   Per patient's sister (present at bedside), patient frequently has periods of cyclical vomiting that last up to a week; patient was currently about 7 days into a period of vomiting where she was unable to keep down PO. Sister reports that when patient was unable to tolerate PO/EtOH, she would get upset/anxious and take additional Xanax. Patient's significant other (who lives in the home) reported that patient did not seem well and patient's daughter drove to check on the patient. On daughter's arrival, patient appeared to be altered and covered in feces with decreased responsiveness. EMS was contacted and on arrival, patient became agitated and combative, fighting EMS personnel. Patient received Versed 4mg  total en route to [facility] ED.  On arrival to [facility] ED, patient was still slightly agitated but calm enough to answer some questions for ED staff. Given AMS, she was taken for CT Head during which she experienced a seizure (witnessed, non-focal shaking movements, 3-5 minutes long per CT staff). Received a total of 4mg  Ativan. Patient was moved back to the ED minimally responsive (likely post-ictal) and not protecting her airway and was subsequently intubated by ED staff. At the time of intubation, patient was noted to have partially dried blood-tinged secretions around her mouth and a small tongue laceration was noted (likely sustained during seizure). Neurology was consulted for further  evaluation of seizure activity with recommendation for LTM. PCCM was consulted for ICU admission for AMS, seizure/likely EtOH withdrawal and ventilator management.  Pertinent  Medical History   Past Medical History:  Diagnosis Date  . Alcohol abuse   . Allergy   . Anxiety   . Cataract 06/09/2012   Right eye and left eye  . Depression   . GERD (gastroesophageal reflux disease)   . 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 Seizures ([place])    xanax withdrawl- December 2013  . Urinary incontinence 06/09/2012   Significant Hospital Events: Including procedures, antibiotic start and stop dates in addition to other pertinent events   . 3/29 - Presented to [facility] ED via EMS for AMS. C/f EtOH/benzo withdrawal. Seizure x 3-5 minutes with generalized "shaking" in CT scanner, tongue lac. Post-ictal, unable to protect airway and intubated. Neuro consulted for seizure, PCCM consult for admission for EtOH withdrawal. CIWA, LTM initiated.  Interim History / Subjective:  Awake, alert and following commands. On mechanical ventilation. Required low dose neo overnight.  Objective   Blood pressure 101/62, pulse 65, temperature 99.86 F (37.7 C), resp. rate 19, height 5\' 6"  (1.676 m), weight 88.9 kg, SpO2 96 %.    Vent Mode: PRVC FiO2 (%):  [40 %-100 %] 40 % Set Rate:  [18 bmp] 18 bmp Vt Set:  [470 mL] 470 mL PEEP:  [5 cmH20] 5 cmH20 Plateau Pressure:  [17 cmH20-20 cmH20] 20 cmH20   Intake/Output Summary (Last 24 hours) at 09/10/2020 0732 Last data filed at 09/10/2020 0700 Gross per 24 hour  Intake 1796.86 ml  Output 140 ml  Net 1656.86 ml  Filed Weights   09/10/20 0000 09/10/20 0245  Weight: 88 kg 88.9 kg   Physical Exam: General: Chronically ill-appearing, no acute distress HENT: [place], AT, ETT in place Eyes: EOMI, no scleral icterus Respiratory: Clear to auscultation bilaterally.  No crackles, wheezing or rales Cardiovascular: RRR, -M/R/G, no JVD GI: BS+, soft,  nontender Extremities:-Edema,-tenderness Neuro: Awake, alert, following commands, moves extremities x 4  Labs/imaging that I have personally reviewed: (right click and "Reselect all SmartList Selections" daily)  WBC 20>19.2 Na 119>124 K 2.9 BUN/Cr 6/1.08 LA 1.7  CXR 3/29 - ETT in place. Bilateral reticular opacities CT Head 3/30 - Normal  Resolved Hospital Problem list     Assessment & Plan:  Seizure likely secondary to EtOH/benzo withdrawal Acute metabolic encephalopathy Presented to [facility] ED via EMS for AMS at home. History of EtOH abuse and benzo use (Xanax, prescribed by psychiatrist). Of note, patient has a history of seizure 2/2 benzo withdrawal (2013). Per patient's sister, drinks 6-10 beers/day at baseline; unable to tolerate PO for one week prior to admission and had taken additional Xanax/run out. Witnessed seizure in CT scanner x 3-5 minutes 3/29PM with subsequent post-ictal state requiring intubation. - Neurology consulted/following, appreciate recommendations: Klonopin 1 mg TID - LTM EEG - No role for AEDs at this time, per Neuro (given likelihood of EtOH withdrawal as etiology of seizure) - CIWA protocol + thiamine/folate - ICU admission for close monitoring, Wean Precedex  Acute hypercapnic respiratory failure in the setting of seizure and AMS Intubated after seizure 3/29, given persistent post-ictal state/AMS and inability to protect airway. - Potential extubation today - Daily WUA/SBT - Continue full vent support (6-8cc/kg IBW) - Wean FiO2 for O2 sat > 90% - VAP bundle - Pulmonary hygiene - PAD protocol for sedation: Precedex and Fentanyl for goal RASS -1 to -2   Sedation related hypotension - Hold on antibiotics. Follow-up fever, WBC and culture data - Wean Precedex  Atrial fibrillation New onset of atrial fibrillation on presentation to ED; likely multifactorial in the setting of dehydration and electrolyte derangements. No history of Afib noted. Diltiazem  bolus given in ED with subsequent ST noted on telemetry. - Currently normal sinus rhythm - Off diltiazem - No need for AC at this time - Echocardiogram - Correct electrolytes for K >4 and Mg >2  Hypovolemic hyponatremia Persistent nausea/vomiting x 1 week PTA, per patient's sister. Inability to tolerate PO. Hgb 17, patient likely intravascularly dry/volume down. - Volume resuscitation with NS - Continue MIVF (NS @ 191mL/hr) x 12 hours  Hypokalemia Hypomagnesemia - Replete - BMP   AKI - Monitor UOP/Cr - Avoid nephrotoxic agents - IVF resuscitation  Leukocytosis WBC 20K noted on admission; low grade fever to Tmax 100.78F. Felt to be reactive as no obvious source of infection at this time; cannot definitively r/o aspiration given recent significant vomiting and seizure.  - F/u BCx/UCx/TA - No empiric antibiotic coverage at this time  Anxiety - Restart home celexa, gabapentin and seroquel - Hold on cymbalta and abilify  Best practice (right click and "Reselect all SmartList Selections" daily)  Diet:  NPO, swallow eval if extubated Pain/Anxiety/Delirium protocol (if indicated): Yes (RASS goal -1,-2) VAP protocol (if indicated): Yes DVT prophylaxis: Subcutaneous Heparin GI prophylaxis: PPI Glucose control:  SSI Yes Central venous access:  N/A Arterial line:  N/A Foley:  Yes, and it is still needed Mobility:  bed rest  PT consulted: Yes Last date of multidisciplinary goals of care discussion [3/29] Code Status:  full code Disposition:  ICU  Labs   CBC: Recent Labs  Lab 09/09/20 2147 09/10/20 0028 09/10/20 0303  WBC 20.9*  --  19.2*  NEUTROABS 19.0*  --   --   HGB 17.7* 17.0* 17.0*  HCT 48.7* 50.0* 47.5*  MCV 91.2  --  91.9  PLT 209  --  185   Basic Metabolic Panel: Recent Labs  Lab 09/09/20 2147 09/10/20 0028 09/10/20 0303 09/10/20 0529  NA 119* 120* 120* 124*  K 3.5 2.6* 2.9*  --   CL 81*  --  82*  --   CO2 23  --  28  --   GLUCOSE 118*  --  116*  --    BUN <5*  --  6*  --   CREATININE 0.78  --  1.08*  --   CALCIUM 9.2  --  9.3  --   MG  --   --  1.5*  --   PHOS  --   --  3.8  --    GFR: Estimated Creatinine Clearance: 60.6 mL/min (A) (by C-G formula based on SCr of 1.08 mg/dL (H)). Recent Labs  Lab 09/09/20 2147 09/10/20 0303 09/10/20 0309  WBC 20.9* 19.2*  --   LATICACIDVEN 2.3*  --  1.7   Liver Function Tests: Recent Labs  Lab 09/09/20 2147 09/10/20 0303  AST 46* 45*  ALT 34 32  ALKPHOS 66 68  BILITOT 1.7* 1.6*  PROT 7.0 7.0  ALBUMIN 4.0 3.8   No results for input(s): LIPASE, AMYLASE in the last 168 hours. Recent Labs  Lab 09/09/20 2147  AMMONIA 30   ABG:    Component Value Date/Time   PHART 7.382 09/10/2020 0028   PCO2ART 49.9 (H) 09/10/2020 0028   PO2ART 248 (H) 09/10/2020 0028   HCO3 29.4 (H) 09/10/2020 0028   TCO2 31 09/10/2020 0028   O2SAT 100.0 09/10/2020 0028    Coagulation Profile: Recent Labs  Lab 09/09/20 2147  INR 1.2   Cardiac Enzymes: No results for input(s): CKTOTAL, CKMB, CKMBINDEX, TROPONINI in the last 168 hours.  HbA1C: Hemoglobin A1C  Date/Time Value Ref Range Status  02/13/2013 10:13 AM 5.2  Final   Hgb A1c MFr Bld  Date/Time Value Ref Range Status  06/20/2020 02:31 PM 5.3 4.8 - 5.6 % Final    Comment:             Prediabetes: 5.7 - 6.4          Diabetes: >6.4          Glycemic control for adults with diabetes: <7.0    CBG: Recent Labs  Lab 09/09/20 2114 09/10/20 0243 09/10/20 0719  GLUCAP 139* 133* 113*    Critical care time: 50 minutes   Mccade Sullenberger Rodman Pickle, MD [place] [facility] 09/10/20 7:32 AM  Please see [email or web address] for pager details.

## 2020-09-10 NOTE — Progress Notes (Signed)
K 2.9, Mg 1.5 Electrolytes replaced per protocol

## 2020-09-10 NOTE — Progress Notes (Signed)
Peripherally Inserted Central Catheter Placement  The IV Nurse has discussed with the patient and/or persons authorized to consent for the patient, the purpose of this procedure and the potential benefits and risks involved with this procedure.  The benefits include less needle sticks, lab draws from the catheter, and the patient may be discharged home with the catheter. Risks include, but not limited to, infection, bleeding, blood clot (thrombus formation), and puncture of an artery; nerve damage and irregular heartbeat and possibility to perform a PICC exchange if needed/ordered by physician.  Alternatives to this procedure were also discussed.  Bard Power PICC patient education guide, fact sheet on infection prevention and patient information card has been provided to patient /or left at bedside. Consent obtained from daughter Estill Bamberg) via telephone. Second verify by Joseph Art, RN.  PICC Placement Documentation  PICC Double Lumen 09/10/20 PICC Right Cephalic 35 cm 0 cm (Active)  Indication for Insertion or Continuance of Line Administration of hyperosmolar/irritating solutions (i.e. TPN, Vancomycin, etc.) 09/10/20 1630  Exposed Catheter (cm) 0 cm 09/10/20 1630  Site Assessment Clean;Dry;Intact 09/10/20 1630  Lumen #1 Status Flushed;Saline locked;Blood return noted 09/10/20 1630  Lumen #2 Status Flushed;Saline locked;Blood return noted 09/10/20 1630  Dressing Type Transparent;Securing device 09/10/20 1630  Dressing Status Clean;Dry;Intact 09/10/20 1630  Antimicrobial disc in place? Yes 09/10/20 1630  Safety Lock Not Applicable 59/56/38 7564  Line Care Connections checked and tightened 09/10/20 1630  Dressing Intervention New dressing 09/10/20 1630  Dressing Change Due 09/17/20 09/10/20 Manuel Garcia 09/10/2020, 4:33 PM

## 2020-09-10 NOTE — Progress Notes (Addendum)
Neurology Progress Note Subjective:  Karen Casey is a 65 y.o. female with a history of Xanax withdrawal seizures in 2013 who has been feeling ill for the past week.  Due to this she was taking repeated doses of Xanax after taking it and then throwing up the pills.  Unfortunately, this led to her running out of her Xanax about 4- 5 days ago.  She also is a heavy drinker, drinking approximately 10 beers per day.   On exam today patient is being weaned with the hope of primary team attempting extubation this afternoon. Patient is alert and able to shake her head yes and no. Patient did not endorse any pain and per clinical team is improving.  Exam: Vitals:   09/10/20 0800 09/10/20 0900  BP: (!) 111/56 (!) 150/82  Pulse: 73 83  Resp: 18 (!) 25  Temp: 100.04 F (37.8 C)   SpO2: 93% (!) 88%   Gen: In bed, NAD HEENT: conjunctive erythematous with eye discharge bilaterally Resp: non-labored breathing, no acute distress Abd: soft, nt  Neuro: MS: Alert, nods yes when asked if she is at home. Patient is able to recognize family when they enter the room. Patient remains intubated preventing speech.  CN: II: PERRL 41mm III, IV, VI: EOM intact V: sensation intact symmetrically VII: Patient able to raise eyebrows appears symmetric at rest as well VIII: intact to voice IX, X: cough and gag intact XII: shoulder shrug symmetric Motor: Spontaneous movement of all extremities. Strength 5/5 in BUE. Patient able to hold her legs off the bed for > 5 seconds.  Sensory: Intact to light touch in all extremities DTR:2+ throughout, Toes downgoing bilaterally Gait: deferred  Pertinent Labs: CBC Latest Ref Rng & Units 09/10/2020 09/10/2020 09/09/2020  WBC 4.0 - 10.5 K/uL 19.2(H) - 20.9(H)  Hemoglobin 12.0 - 15.0 g/dL 17.0(H) 17.0(H) 17.7(H)  Hematocrit 36.0 - 46.0 % 47.5(H) 50.0(H) 48.7(H)  Platelets 150 - 400 K/uL 199 - 209   CMP Latest Ref Rng & Units 09/10/2020 09/10/2020 09/10/2020  Glucose 70 - 99  mg/dL - 116(H) -  BUN 8 - 23 mg/dL - 6(L) -  Creatinine 0.44 - 1.00 mg/dL - 1.08(H) -  Sodium 135 - 145 mmol/L 124(L) 120(L) 120(L)  Potassium 3.5 - 5.1 mmol/L - 2.9(L) 2.6(LL)  Chloride 98 - 111 mmol/L - 82(L) -  CO2 22 - 32 mmol/L - 28 -  Calcium 8.9 - 10.3 mg/dL - 9.3 -  Total Protein 6.5 - 8.1 g/dL - 7.0 -  Total Bilirubin 0.3 - 1.2 mg/dL - 1.6(H) -  Alkaline Phos 38 - 126 U/L - 68 -  AST 15 - 41 U/L - 45(H) -  ALT 0 - 44 U/L - 32 -   EEG, LTM 09/10/2020 This was an abnormal continuous video EEG due to a sedated, slow background with some excess beta activity (likely a medication effect). No epileptiform discharges or seizures were seen.  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 Patient is known to have withdrawal seizures and per hx patient recently ran out of her Xanax after frequent use and was found minimally responsive with the last known EtOH use unknown. LTM with no seizures, slow background likely 2/2 to patient being sedated during the EEG. Patient witnessed seizures were likely 2/2 to withdrawal. No AEDs recommended. Patient appears to be making expected improvements and neurological exam is returning to baseline and  expected to continue to improve with extubation.  Recommendations: - Continue home Xanax 2mg  TID PRN along with 0.5mg  qhs PRN. - continue CIWA protocol. - In the longterm, recommend eventual discontinuation of Xanax with gradual taper, especially as patient has known EtOH use disorder.   Damita Dunnings, MD PGY-1

## 2020-09-10 NOTE — Consult Note (Signed)
[place] Nurse Consult Note: Patient receiving care in [facility] 3M04. Assisted with turning by primary RN. Reason for Consult: excoriation to buttocks from extended contact with feces and urine. Wound type:  Irritant Contact Dermatitis ICD -[id number] - Due to fecal, urinary or dual incontinence  Pressure Injury POA: Yes/No/NA Measurement: entire buttocks and perineum Wound bed: bright red Drainage (amount, consistency, odor) none Periwound: Dressing procedure/placement/frequency: 4 times daily application of Gerhardt's butt cream containing antifungal, steroids, and zinc. Thank you for the consult.  Discussed plan of care with the patient and bedside nurse.  [place] nurse will not follow at this time.  Please re-consult the [place] team if needed.  Val Riles, RN, MSN, CWOCN, CNS-BC, pager [phone]

## 2020-09-10 NOTE — Consult Note (Signed)
Neurology Consultation Reason for Consult: Seizure Referring Physician: Karlton Lemon, C  CC: Seizure  History is obtained from: Patient's family  HPI: Karen Casey is a 65 y.o. female with a history of Xanax withdrawal seizures in 2013 who has been feeling ill for the past week.  Due to this she was taking repeated doses of Xanax after taking it and then throwing up the pills.  Unfortunately, this led to her running out of her Xanax about 4- 5 days ago.  She also is a heavy drinker, drinking approximately 10 beers per day.  She has not been able to drink since getting ill, however.  She apparently has been fairly agitated all day, as well as saying things that did not fit the situation.  Then, she came in markedly more confused having bit her tongue.  Her sister had been trying to reach her throughout the day, but she would not answer her phone.  Her daughter found her agitated and covered in feces.  She was brought to the emergency department where she had a second witnessed seizure, and was intubated for airway protection.   ROS:  Unable to obtain due to altered mental status.   Past Medical History:  Diagnosis Date  . Alcohol abuse   . Allergy   . Anxiety   . Cataract 06/09/2012   Right eye and left eye  . Depression   . GERD (gastroesophageal reflux disease)   . 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 Seizures ([place])    xanax withdrawl- December 2013  . Urinary incontinence 06/09/2012     Family History  Problem Relation Age of Onset  . Hypertension Mother   . Hyperlipidemia Mother   . Aneurysm Mother        Rupture - Cause of death  . Heart disease Father        MI - cause of death  . Depression Father   . Parkinsonism Father   . Colon cancer Neg Hx   . Esophageal cancer Neg Hx   . Rectal cancer Neg Hx   . Stomach cancer Neg Hx      Social History:  reports that she has been smoking cigarettes. She has a 43.00 pack-year smoking history. She has  never used smokeless tobacco. She reports current alcohol use of about 3.0 standard drinks of alcohol per week. She reports current drug use. Drugs: Marijuana and Other-see comments.   Exam: Current vital signs: BP 95/62   Pulse (!) 104   Temp 100.2 F (37.9 C)   Resp 20   Ht 5\' 6"  (1.676 m)   Wt 88 kg   SpO2 90%   BMI 31.31 kg/m  Vital signs in last 24 hours: Temp:  [98.2 F (36.8 C)-100.6 F (38.1 C)] 100.2 F (37.9 C) (03/30 0100) Pulse Rate:  [31-130] 104 (03/30 0100) Resp:  [16-37] 20 (03/30 0100) BP: (95-176)/(62-124) 95/62 (03/30 0100) SpO2:  [90 %-100 %] 90 % (03/30 0100) FiO2 (%):  [40 %-100 %] 40 % (03/30 0034) Weight:  [88 kg] 88 kg (03/30 0000)   Physical Exam  Constitutional: Appears well-developed and well-nourished.  Psych: Unresponsive Eyes: No scleral injection HENT: ET tube in place MSK: no joint deformities.  Cardiovascular: Normal rate and regular rhythm.  Respiratory: Effort normal, ventilated GI: Soft.  No distension. There is no tenderness.  Skin: WDI  Neuro:( I suspect paralytic may be still playing some role) Mental Status: Patient is obtunded, she does not  open eyes or follow commands Cranial Nerves: II: Does not blink to threat pupils are equal, round, and reactive to light.   III,IV, VI: No response to doll's eye maneuver V:VII: Corneals are absent Motor: She does not respond to noxious stimulation  sensory: As above  Deep Tendon Reflexes: Absent  Cerebellar: Does not perform   I have reviewed labs in epic and the results pertinent to this consultation are: Sodium 119 Calcium 9.2 Ammonia 30   I have reviewed the images obtained: CT head-negative  Impression: 65 year old female with likely benzodiazepine withdrawal.  The timing is consistent with this, and with her history of benzo withdrawal seizures, I think this is most likely explanation. I do think she will need replacement benzodiazepine and will start a longer acting  benzo.  With multiple episodes of withdrawal seizures now, I think that changing from Xanax to a longer acting benzodiazepine would be prudent.  I will start her on Klonopin 1 mg 3 times daily, and continue as needed Ativan per the CIWA protocol, which I suspect she will need.    Given her poor exam and repeated seizures, I would favor continued EEG monitoring at least through the morning.  Recommendations: 1) Klonopin 1 mg 3 times daily 2) continuous EEG 3) neurology will continue to follow   Roland Rack, MD [facility] [phone]  If 7pm- 7am, please page neurology on call as listed in [place].

## 2020-09-10 NOTE — Progress Notes (Signed)
Reglued the majority of the leads; no skin breakdown was seen. [facility] monitoring, event button tested.

## 2020-09-10 NOTE — H&P (Addendum)
NAME:  Karen Casey, MRN:  [id number], DOB:  January 23, 1956, LOS: 0 ADMISSION DATE:  09/09/2020, CONSULTATION DATE:  09/09/2020 REFERRING MD:  Calvert Cantor CHIEF COMPLAINT:  AMS, seizure, concern for EtOH withdrawal   History of Present Illness:  65 year old female with PMHx significant for depression/anxiety (chronic Xanax use with history of seizure 2/2 withdrawal 2013), EtOH abuse (drinks estimated 6-10 beers/day) and tobacco abuse (43 pack-years) who presented to [facility] ED via EMS 3/29 after being found altered at home.   Per patient's sister (present at bedside), patient frequently has periods of cyclical vomiting that last up to a week; patient was currently about 7 days into a period of vomiting where she was unable to keep down PO. Sister reports that when patient was unable to tolerate PO/EtOH, she would get upset/anxious and take additional Xanax. Patient's significant other (who lives in the home) reported that patient did not seem well and patient's daughter drove to check on the patient. On daughter's arrival, patient appeared to be altered and covered in feces with decreased responsiveness. EMS was contacted and on arrival, patient became agitated and combative, fighting EMS personnel. Patient received Versed 4mg  total en route to [facility] ED.  On arrival to [facility] ED, patient was still slightly agitated but calm enough to answer some questions for ED staff. Given AMS, she was taken for CT Head during which she experienced a seizure (witnessed, non-focal shaking movements, 3-5 minutes long per CT staff). Received a total of 4mg  Ativan. Patient was moved back to the ED minimally responsive (likely post-ictal) and not protecting her airway and was subsequently intubated by ED staff. At the time of intubation, patient was noted to have partially dried blood-tinged secretions around her mouth and a small tongue laceration was noted (likely sustained during seizure). Neurology was consulted for further  evaluation of seizure activity with recommendation for LTM. PCCM was consulted for ICU admission for AMS, seizure/likely EtOH withdrawal and ventilator management.  Pertinent  Medical History   Past Medical History:  Diagnosis Date  . Alcohol abuse   . Allergy   . Anxiety   . Cataract 06/09/2012   Right eye and left eye  . Depression   . GERD (gastroesophageal reflux disease)   . 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 Seizures ([place])    xanax withdrawl- December 2013  . Urinary incontinence 06/09/2012   Significant Hospital Events: Including procedures, antibiotic start and stop dates in addition to other pertinent events   . 3/29 - Presented to [facility] ED via EMS for AMS. C/f EtOH/benzo withdrawal. Seizure x 3-5 minutes with generalized "shaking" in CT scanner, tongue lac. Post-ictal, unable to protect airway and intubated. Neuro consulted for seizure, PCCM consult for admission for EtOH withdrawal. CIWA, LTM initiated.  Interim History / Subjective:    Objective   Blood pressure 95/62, pulse (!) 104, temperature 100.2 F (37.9 C), resp. rate 20, height 5\' 6"  (1.676 m), weight 88 kg, SpO2 90 %.    Vent Mode: PRVC FiO2 (%):  [40 %-100 %] 40 % Set Rate:  [18 bmp] 18 bmp Vt Set:  [470 mL] 470 mL PEEP:  [5 cmH20] 5 cmH20 Plateau Pressure:  [17 cmH20] 17 cmH20  No intake or output data in the 24 hours ending 09/10/20 0111 Filed Weights   09/10/20 0000  Weight: 88 kg   Physical Examination: General: Chronically ill-appearing middle-aged woman in NAD. HEENT: [place]/AT, anicteric sclera, PERRL, moist mucous membranes. Neuro: Sedated.  Responds to noxious stimuli. Not following commands.  CV: RRR, no m/g/r. PULM: Breathing even and unlabored on vent (PEEP 5, FiO2 40%). Lung fields with coarse rhonchi bilaterally. GI: Soft, nontender, nondistended. Normoactive bowel sounds. Extremities: No LE edema noted. Skin: Warm/dry, non-blanching petechial rash noted to L lateral  thigh (see Media tab).  Labs/imaging that I have personally reviewed: (right click and "Reselect all SmartList Selections" daily)  WBC 20K, low Na 119, normal K/CO2, normal renal function with Cr 0.78, slightly elevated AST 46, ALT 34 and Tbili 1.7, INR 1.2. Trops were mildly elevated at 20. Lactic acid 2.3. CXR demonstrated coarse reticular opacities (likely atelectasis). CT Head was ordered (pending).  Resolved Hospital Problem list     Assessment & Plan:  Seizure likely secondary to EtOH withdrawal Acute encephalopathy Presented to [facility] ED via EMS for AMS at home. History of EtOH abuse and benzo use (Xanax, prescribed by psychiatrist). Of note, patient has a history of seizure 2/2 benzo withdrawal (2013). Per patient's sister, drinks 6-10 beers/day at baseline; unable to tolerate PO for one week prior to admission and had taken additional Xanax/run out. Witnessed seizure in CT scanner x 3-5 minutes 3/29PM with subsequent post-ictal state requiring intubation. - Neurology consulted/following, appreciate recommendations - F/u CT Head once able to tolerate - LTM EEG - No role for AEDs at this time, per Neuro (given likelihood of EtOH withdrawal as etiology of seizure) - CIWA protocol + thiamine/folate - ICU admission for close monitoring, Precedex  Acute hypercapnic respiratory failure in the setting of seizure and AMS Intubated after seizure 3/29, given persistent post-ictal state/AMS and inability to protect airway. - Continue full vent support (6-8cc/kg IBW) - Wean FiO2 for O2 sat > 90% - Daily WUA/SBT - VAP bundle - Pulmonary hygiene - PAD protocol for sedation: Precedex and Fentanyl for goal RASS -1 to -2  Atrial fibrillation New onset of atrial fibrillation on presentation to ED; likely multifactorial in the setting of dehydration and electrolyte derangements. No history of Afib noted. Diltiazem bolus given in ED with subsequent ST noted on telemetry. - Continue diltiazem gtt for  now as BP tolerates - No need for AC at this time  Leukocytosis WBC 20K noted on admission; low grade fever to Tmax 100.62F. Felt to be reactive as no obvious source of infection at this time; cannot definitively r/o aspiration given recent significant vomiting and seizure.  - Repeat CXR 3/30AM - F/u BCx/UCx/TA - No empiric antibiotic coverage at this time  Hyponatremia likely secondary to dehydration Persistent nausea/vomiting x 1 week PTA, per patient's sister. Inability to tolerate PO. Hgb 17, patient likely intravascularly dry/volume down. - Volume resuscitation with NS - Continue MIVF (NS @ 158mL/hr) - Na Q2H, monitor rate of correction  Best practice (right click and "Reselect all SmartList Selections" daily)  Diet:  NPO Pain/Anxiety/Delirium protocol (if indicated): Yes (RASS goal -1,-2) VAP protocol (if indicated): Yes DVT prophylaxis: Subcutaneous Heparin GI prophylaxis: PPI Glucose control:  SSI Yes Central venous access:  N/A Arterial line:  N/A Foley:  Yes, and it is still needed Mobility:  bed rest  PT consulted: Yes Last date of multidisciplinary goals of care discussion [3/29] Code Status:  full code Disposition: ICU  Labs   CBC: Recent Labs  Lab 09/09/20 2147 09/10/20 0028  WBC 20.9*  --   NEUTROABS 19.0*  --   HGB 17.7* 17.0*  HCT 48.7* 50.0*  MCV 91.2  --   PLT 209  --    Basic  Metabolic Panel: Recent Labs  Lab 09/09/20 2147 09/10/20 0028  NA 119* 120*  K 3.5 2.6*  CL 81*  --   CO2 23  --   GLUCOSE 118*  --   BUN <5*  --   CREATININE 0.78  --   CALCIUM 9.2  --    GFR: Estimated Creatinine Clearance: 81.5 mL/min (by C-G formula based on SCr of 0.78 mg/dL). Recent Labs  Lab 09/09/20 2147  WBC 20.9*  LATICACIDVEN 2.3*   Liver Function Tests: Recent Labs  Lab 09/09/20 2147  AST 46*  ALT 34  ALKPHOS 66  BILITOT 1.7*  PROT 7.0  ALBUMIN 4.0   No results for input(s): LIPASE, AMYLASE in the last 168 hours. Recent Labs  Lab  09/09/20 2147  AMMONIA 30   ABG:    Component Value Date/Time   PHART 7.382 09/10/2020 0028   PCO2ART 49.9 (H) 09/10/2020 0028   PO2ART 248 (H) 09/10/2020 0028   HCO3 29.4 (H) 09/10/2020 0028   TCO2 31 09/10/2020 0028   O2SAT 100.0 09/10/2020 0028    Coagulation Profile: Recent Labs  Lab 09/09/20 2147  INR 1.2   Cardiac Enzymes: No results for input(s): CKTOTAL, CKMB, CKMBINDEX, TROPONINI in the last 168 hours.  HbA1C: Hemoglobin A1C  Date/Time Value Ref Range Status  02/13/2013 10:13 AM 5.2  Final   Hgb A1c MFr Bld  Date/Time Value Ref Range Status  06/20/2020 02:31 PM 5.3 4.8 - 5.6 % Final    Comment:             Prediabetes: 5.7 - 6.4          Diabetes: >6.4          Glycemic control for adults with diabetes: <7.0    CBG: Recent Labs  Lab 09/09/20 2114  GLUCAP 139*    Review of Systems:   Patient is encephalopathic and/or intubated; therefore history has been obtained from sister Jackelyn Poling) at bedside and from chart review. Pertinent positives/negatives outlined in above HPI.  Past Medical History:  She,  has a past medical history of Alcohol abuse, Allergy, Anxiety, Cataract (06/09/2012), Depression, GERD (gastroesophageal reflux disease), Rupture of appendix (06/09/2012), Seizures ([place]), and Urinary incontinence (06/09/2012).  Surgical History:   Past Surgical History:  Procedure Laterality Date  . APPENDECTOMY    . CATARACT EXTRACTION  06/09/2012   Left eye  . left shoulder dislocation  Sept 2011    Social History:   reports that she has been smoking cigarettes. She has a 43.00 pack-year smoking history. She has never used smokeless tobacco. She reports current alcohol use of about 3.0 standard drinks of alcohol per week. She reports current drug use. Drugs: Marijuana and Other-see comments.   Family History:  Her family history includes Aneurysm in her mother; Depression in her father; Heart disease in her father; Hyperlipidemia in her mother;  Hypertension in her mother; Parkinsonism in her father. There is no history of Colon cancer, Esophageal cancer, Rectal cancer, or Stomach cancer.   Allergies No Known Allergies   Home Medications  Prior to Admission medications   Medication Sig Start Date End Date Taking? Authorizing Provider  ALPRAZolam Duanne Moron) 1 MG tablet Take 1 mg by mouth 4 (four) times daily as needed.  08/29/17   [provider]  azelastine (OPTIVAR) 0.05 % ophthalmic solution Place 1 drop into both eyes 2 (two) times daily. 07/15/20   [name], MD  citalopram (CELEXA) 40 MG tablet Take 1 tablet (40 mg  total) by mouth daily. 05/31/18   Alveda Reasons, MD  esomeprazole (NEXIUM) 40 MG capsule Take 1 capsule (40 mg total) by mouth daily at 12 noon. 06/20/20   [name], MD  fluticasone (FLONASE) 50 MCG/ACT nasal spray SHAKE LIQUID AND USE 2 SPRAYS IN EACH NOSTRIL DAILY 07/15/20   [name], MD  gabapentin (NEURONTIN) 600 MG tablet Take 2 tablets (1,200 mg total) by mouth 3 (three) times daily. 02/20/18   Alveda Reasons, MD  ibuprofen (ADVIL) 600 MG tablet Take 1 tablet (600 mg total) by mouth 3 (three) times daily with meals as needed. 07/27/19   Caccavale, Sophia, PA-C  Multiple Vitamins-Minerals (CENTRUM SILVER 50+WOMEN PO) Take 1 tablet by mouth daily.    [provider]  polyvinyl alcohol (LIQUITEARS) 1.4 % ophthalmic solution Place 3 drops into both eyes as needed for dry eyes. Patient not taking: Reported on 06/20/2020 03/04/20   Doristine Mango L, DO  Probiotic Product (PROBIOTIC PO) Take 1 capsule by mouth daily.    [provider]  QUEtiapine Fumarate (SEROQUEL XR) 150 MG 24 hr tablet Take 150 mg by mouth at bedtime. 09/02/20   [provider]    Critical care time: 47 minutes   Lestine Mount, PA-C [facility] 09/10/20 1:11 AM  Please see [email or web address] for pager details.

## 2020-09-10 NOTE — Progress Notes (Signed)
LTM EEG hooked up and running - no initial skin breakdown - push button tested - neuro notified.  

## 2020-09-10 NOTE — Progress Notes (Signed)
RT NOTE:  Bite block placed on ETT.  Sputum sample collected, labeled and sent to lab.

## 2020-09-10 NOTE — Progress Notes (Signed)
LTM discontinued; no skin breakdown was seen. 

## 2020-09-10 NOTE — Procedures (Addendum)
EEG Procedure CPT/Type of Study: 01642; 24hr EEG with video Referring Provider: Earlie Server Primary Neurological Diagnosis: seizure  History: This is a 65 yr old patient, undergoing an EEG to evaluate for withdrawal seizures. Clinical State: obtunded  Technical Description:  The EEG was performed using standard setting per the guidelines of American Clinical Neurophysiology Society (ACNS).  A minimum of 21 electrodes were placed on scalp according to the International 10-20 or/and 10-10 Systems. Supplemental electrodes were placed as needed. Single EKG electrode was also used to detect cardiac arrhythmia. Patient's behavior was continuously recorded on video simultaneously with EEG. A minimum of 16 channels were used for data display. Each epoch of study was reviewed manually daily and as needed using standard referential and bipolar montages. Computerized quantitative EEG analysis (such as compressed spectral array analysis, trending, automated spike & seizure detection) were used as indicated.   Day 1: from 0120 09/10/20 to 1656 09/10/20  EEG Description: Overall Amplitude: Low Predominant Frequency: The background activity showed low voltage delta-theta activity. Superimposed Frequencies: diffuse beta activity bilaterally The background was symmetric  Background Abnormalities: diffuse slowing as above Rhythmic or periodic pattern: No Epileptiform activity: no Electrographic seizures: no Events: no   Breach rhythm: no  Reactivity: Present  Stimulation procedures:  Hyperventilation: not done Photic stimulation: not done  Sleep Background: Stage II  EKG:no significant arrhythmia  Impression: This was an abnormal continuous video EEG due to a sedated, slow background with some excess beta activity (likely a medication effect). No epileptiform discharges or seizures were seen.

## 2020-09-10 NOTE — Progress Notes (Signed)
[place] Progress Note Patient Name: CHRYSTLE MURILLO DOB: 10/01/1955 MRN: [id number]   Date of Service  09/10/2020  HPI/Events of Note  Lactic Acid = 2.3 - Nursing request for repeat Lactic Acid in AM.   eICU Interventions  Will order Lactic Acid at 5 AM.     Intervention Category Major Interventions: Acid-Base disturbance - evaluation and management  Tychelle Purkey Eugene 09/10/2020, 2:55 AM

## 2020-09-10 NOTE — Progress Notes (Signed)
Initial Nutrition Assessment  DOCUMENTATION CODES:   Obesity unspecified  INTERVENTION:   Initiate tube feeding via OG tube: - Start Vital High Protein @ 25 ml/hr and advance by 10 ml q 4 hours to goal rate of 45 ml/hr (1080 ml/day) - ProSource TF 45 ml TID  Tube feeding regimen at goal rate provides 1200 kcal, 128 grams of protein, and 903 ml of H2O.   Monitor magnesium, potassium, and phosphorus daily for at least 3 days, MD to replete as needed, as pt is at risk for refeeding syndrome given reports of vomiting x 7 days PTA, EtOH abuse.  NUTRITION DIAGNOSIS:   Inadequate oral intake related to inability to eat as evidenced by NPO status.  GOAL:   Provide needs based on ASPEN/SCCM guidelines  MONITOR:   Vent status,Labs,Weight trends,TF tolerance  REASON FOR ASSESSMENT:   Ventilator,Consult Enteral/tube feeding initiation and management  ASSESSMENT:   65 year old female who presented to the ED on 3/29 with AMS. PMH of EtOH abuse (approximately 6-10 beers per day), depression, anxiety, tobacco abuse. Pt admitted with seizure and respiratory failure.   Per notes, pt had been experiencing about 7 days of cyclical vomiting PTA and had been unable to keep down POs during this time. Per notes, seizures are likely secondary to withdrawal.  Discussed pt during ICU rounds and with RN. Per CCM notes, plan was for hopeful extubation this afternoon. Per RN, pt had an episode of vomiting and became diaphoretic. OG tube is in place with tip in gastric fundus per abdominal x-ray, currently clamped.  Later received consult for tube feeding initiation and management. Discussed with RN.  No family present at time of RD visit. Unable to obtain diet and weight history at this time. Reviewed weight history in chart. Weight has trended up over the last 3 years.  Patient is currently intubated on ventilator support MV: 11.5 L/min Temp (24hrs), Avg:99.9 F (37.7 C), Min:98.2 F (36.8 C),  Max:100.6 F (38.1 C) BP (cuff): 96/68 MAP (cuff): 78  Drips: Precedex Fentanyl Neosynephrine NS: 125 ml/hr  Medications reviewed and include: colace, folic acid, ativan, protonix, thiamine, IV abx  Labs reviewed: sodium 123, magnesium 1.5 CBG's: 107-139 x 24 hours  I/O's: +3.0 L since admission  NUTRITION - FOCUSED PHYSICAL EXAM:  Flowsheet Row Most Recent Value  Orbital Region No depletion  Upper Arm Region No depletion  Thoracic and Lumbar Region No depletion  Buccal Region Unable to assess  Temple Region No depletion  Clavicle Bone Region Mild depletion  Clavicle and Acromion Bone Region Mild depletion  Scapular Bone Region Unable to assess  Dorsal Hand No depletion  Patellar Region No depletion  Anterior Thigh Region Mild depletion  Posterior Calf Region Mild depletion  Edema (RD Assessment) None  Hair Reviewed  Eyes Reviewed  Mouth Reviewed  Skin Reviewed  Nails Reviewed       Diet Order:   Diet Order            Diet NPO time specified  Diet effective now                 EDUCATION NEEDS:   No education needs have been identified at this time  Skin:  Skin Assessment: Reviewed RN Assessment  Last BM:  no documented BM  Height:   Ht Readings from Last 1 Encounters:  09/09/20 5\' 6"  (1.676 m)    Weight:   Wt Readings from Last 1 Encounters:  09/10/20 88.9 kg   IBW: 59.1 kg  BMI:  Body mass index is 31.63 kg/m.  Estimated Nutritional Needs:   Kcal:  [phone]  Protein:  118-130 grams  Fluid:  >/= 1.5 L    Gustavus Bryant, MS, RD, LDN Inpatient Clinical Dietitian Please see AMiON for contact information.

## 2020-09-10 NOTE — Progress Notes (Signed)
  Echocardiogram 2D Echocardiogram has been performed.  Karen Casey 09/10/2020, 12:19 PM

## 2020-09-10 NOTE — Progress Notes (Signed)
Transported pt from Ed RM 27 to CT and then to 59M RM 04. No complications noted.

## 2020-09-11 ENCOUNTER — Inpatient Hospital Stay (HOSPITAL_COMMUNITY): Payer: 59

## 2020-09-11 DIAGNOSIS — F10239 Alcohol dependence with withdrawal, unspecified: Secondary | ICD-10-CM | POA: Diagnosis not present

## 2020-09-11 DIAGNOSIS — R6521 Severe sepsis with septic shock: Secondary | ICD-10-CM | POA: Diagnosis not present

## 2020-09-11 DIAGNOSIS — A419 Sepsis, unspecified organism: Secondary | ICD-10-CM | POA: Diagnosis not present

## 2020-09-11 DIAGNOSIS — J9601 Acute respiratory failure with hypoxia: Secondary | ICD-10-CM | POA: Diagnosis not present

## 2020-09-11 LAB — CBC
HCT: 39.1 % (ref 36.0–46.0)
Hemoglobin: 13.2 g/dL (ref 12.0–15.0)
MCH: 32.8 pg (ref 26.0–34.0)
MCHC: 33.8 g/dL (ref 30.0–36.0)
MCV: 97 fL (ref 80.0–100.0)
Platelets: 165 10*3/uL (ref 150–400)
RBC: 4.03 MIL/uL (ref 3.87–5.11)
RDW: 12.9 % (ref 11.5–15.5)
WBC: 17.4 10*3/uL — ABNORMAL HIGH (ref 4.0–10.5)
nRBC: 0 % (ref 0.0–0.2)

## 2020-09-11 LAB — GLUCOSE, CAPILLARY
Glucose-Capillary: 115 mg/dL — ABNORMAL HIGH (ref 70–99)
Glucose-Capillary: 115 mg/dL — ABNORMAL HIGH (ref 70–99)
Glucose-Capillary: 107 mg/dL — ABNORMAL HIGH (ref 70–99)
Glucose-Capillary: 133 mg/dL — ABNORMAL HIGH (ref 70–99)
Glucose-Capillary: 138 mg/dL — ABNORMAL HIGH (ref 70–99)
Glucose-Capillary: 134 mg/dL — ABNORMAL HIGH (ref 70–99)

## 2020-09-11 LAB — BASIC METABOLIC PANEL
Anion gap: 6 (ref 5–15)
BUN: 13 mg/dL (ref 8–23)
CO2: 21 mmol/L — ABNORMAL LOW (ref 22–32)
Calcium: 7.9 mg/dL — ABNORMAL LOW (ref 8.9–10.3)
Chloride: 101 mmol/L (ref 98–111)
Creatinine, Ser: 0.7 mg/dL (ref 0.44–1.00)
GFR, Estimated: >60 mL/min (ref >60)
Glucose, Bld: 112 mg/dL — ABNORMAL HIGH (ref 70–99)
Potassium: 3.6 mmol/L (ref 3.5–5.1)
Sodium: 128 mmol/L — ABNORMAL LOW (ref 135–145)

## 2020-09-11 LAB — URINE CULTURE: Culture: NO GROWTH

## 2020-09-11 LAB — PHOSPHORUS: Phosphorus: 2.9 mg/dL (ref 2.5–4.6)

## 2020-09-11 LAB — MRSA PCR SCREENING: MRSA by PCR: NEGATIVE

## 2020-09-11 LAB — MAGNESIUM: Magnesium: 2.2 mg/dL (ref 1.7–2.4)

## 2020-09-11 MED ORDER — ALPRAZOLAM 0.5 MG PO TABS: 2.0000 mg | ORAL_TABLET | Freq: Three times a day (TID) | ORAL | Status: DC

## 2020-09-11 MED ORDER — POLYETHYLENE GLYCOL 3350 17 G PO PACK: 17.0000 g | PACK | Freq: Every day | ORAL | Status: DC | PRN

## 2020-09-11 MED ORDER — PHENYLEPHRINE CONCENTRATED 100MG/250ML (0.4 MG/ML) INFUSION SIMPLE
0.0000 ug/min | INTRAVENOUS | Status: DC
  Administered 2020-09-11: 200 ug/min via INTRAVENOUS
  Filled 2020-09-11 (×2): qty 250

## 2020-09-11 MED ORDER — ALBUTEROL SULFATE (2.5 MG/3ML) 0.083% IN NEBU
2.5000 mg | INHALATION_SOLUTION | RESPIRATORY_TRACT | Status: DC | PRN
  Administered 2020-09-12 – 2020-09-21 (×9): 2.5 mg via RESPIRATORY_TRACT
  Filled 2020-09-11 (×9): qty 3

## 2020-09-11 MED ORDER — ALPRAZOLAM 0.5 MG PO TABS
2.0000 mg | ORAL_TABLET | Freq: Two times a day (BID) | ORAL | Status: DC
  Administered 2020-09-11 – 2020-09-13 (×3): 2 mg
  Filled 2020-09-11 (×3): qty 4

## 2020-09-11 MED ORDER — SODIUM CHLORIDE 0.9 % IV BOLUS
500.0000 mL | Freq: Once | INTRAVENOUS | Status: AC
  Administered 2020-09-11: 500 mL via INTRAVENOUS

## 2020-09-11 MED ORDER — NICOTINE 7 MG/24HR TD PT24
7.0000 mg | MEDICATED_PATCH | Freq: Every day | TRANSDERMAL | Status: DC
  Administered 2020-09-11 – 2020-09-22 (×12): 7 mg via TRANSDERMAL
  Filled 2020-09-11 (×12): qty 1

## 2020-09-11 MED ORDER — ONDANSETRON HCL 4 MG/2ML IJ SOLN
4.0000 mg | Freq: Four times a day (QID) | INTRAMUSCULAR | Status: DC | PRN
  Administered 2020-09-11 – 2020-09-15 (×4): 4 mg via INTRAVENOUS
  Filled 2020-09-11 (×4): qty 2

## 2020-09-11 MED ORDER — POTASSIUM CHLORIDE 20 MEQ PO PACK
40.0000 meq | PACK | Freq: Once | ORAL | Status: AC
  Administered 2020-09-11: 40 meq
  Filled 2020-09-11: qty 2

## 2020-09-11 MED ORDER — NOREPINEPHRINE 4 MG/250ML-% IV SOLN
0.0000 ug/min | INTRAVENOUS | Status: DC
  Administered 2020-09-11: 7 ug/min via INTRAVENOUS
  Administered 2020-09-11: 2 ug/min via INTRAVENOUS
  Administered 2020-09-12: 9 ug/min via INTRAVENOUS
  Filled 2020-09-11 (×3): qty 250

## 2020-09-11 MED ORDER — GABAPENTIN 250 MG/5ML PO SOLN
1200.0000 mg | Freq: Three times a day (TID) | ORAL | Status: DC
  Administered 2020-09-11 – 2020-09-12 (×3): 1200 mg
  Filled 2020-09-11 (×8): qty 24

## 2020-09-11 MED ORDER — DOCUSATE SODIUM 50 MG/5ML PO LIQD: 100.0000 mg | Freq: Two times a day (BID) | ORAL | Status: DC | PRN

## 2020-09-11 MED ORDER — ADULT MULTIVITAMIN W/MINERALS CH
1.0000 | ORAL_TABLET | Freq: Every day | ORAL | Status: DC
  Administered 2020-09-11 – 2020-09-12 (×2): 1
  Filled 2020-09-11 (×2): qty 1

## 2020-09-11 NOTE — Progress Notes (Addendum)
Neurology Progress Note Subjective: Karen Casey continues to improve. This AM patient is alert and able to mouth clearly over her ventilation tube and attempts to write some responses. Patient reports (via writing and hand movements) that she does have some lower back itching, but otherwise is in no pain. On initial exam patient's sister is in the room and reports that it was not her, but the patient's daughter who found her. Patient's sister Karen Casey, reports that she was found in old soiled adult diapers and has a rash in that area.  Patient was unable to extubated yesterday per primary team, patient had and episode of emesis yesterday and may have aspiration PNA as she is now meeting criteria for sepsis.  Exam: Vitals:   09/11/20 0800 09/11/20 0900  BP: (!) 95/44   Pulse: 73 (!) 59  Resp: 20 12  Temp:    SpO2: 99% 97%   Gen: In bed, NAD Resp: non-labored breathing, no acute distress Abd: soft, nt MSK: Palpated along spine and patient clarified through writing that her lumbar spine did not hurt but that she was itching that area  Neuro: Neuro: MS: Alert, and oriented to person, place, and situation. Patient is able to recognize family when they enter the room. Patient remains intubated preventing speech, but is able to mouth words pretty clearly and at times will attempt to write responses.  CN: II: PERRL 12mm III, IV, VI: EOM intact V: sensation intact symmetrically VII: Patient able to raise eyebrows appears symmetric at rest as well VIII: intact to voice IX, X: cough and gag intact XII: shoulder shrug symmetric Motor: Spontaneous movement of all extremities, still has trouble comprehending some instructions for testing of flexion and extension of BUE. Strength 5/5 in BUE. Patient able to hold her legs off the bed for > 5 seconds.  Sensory: Intact to light touch in all extremities DTR:2+ throughout, Toes downgoing bilaterally Coordination: Patient is able to hold a pencil and paper  but her writing is a bit difficult to decipher.  Gait: deferred  EEG, LTM 09/10/2020 This was an abnormal continuous video EEG due to a sedated, slow background with some excess beta activity (likely a medication effect). No epileptiform discharges or seizures were seen.  CT Head 09/10/2020 IMPRESSION: Normal head CT.  Pertinent Labs: CBC Latest Ref Rng & Units 09/11/2020 09/10/2020 09/10/2020  WBC 4.0 - 10.5 K/uL 17.4(H) 19.2(H) -  Hemoglobin 12.0 - 15.0 g/dL 13.2 17.0(H) 17.0(H)  Hematocrit 36.0 - 46.0 % 39.1 47.5(H) 50.0(H)  Platelets 150 - 400 K/uL 165 199 -   CMP Latest Ref Rng & Units 09/11/2020 09/10/2020 09/10/2020  Glucose 70 - 99 mg/dL 112(H) - 120(H)  BUN 8 - 23 mg/dL 13 - 7(L)  Creatinine 0.44 - 1.00 mg/dL 0.70 - 1.05(H)  Sodium 135 - 145 mmol/L 128(L) 126(L) 123(L)  Potassium 3.5 - 5.1 mmol/L 3.6 - 3.8  Chloride 98 - 111 mmol/L 101 - 92(L)  CO2 22 - 32 mmol/L 21(L) - 21(L)  Calcium 8.9 - 10.3 mg/dL 7.9(L) - 8.2(L)  Total Protein 6.5 - 8.1 g/dL - - -  Total Bilirubin 0.3 - 1.2 mg/dL - - -  Alkaline Phos 38 - 126 U/L - - -  AST 15 - 41 U/L - - -  ALT 0 - 44 U/L - - -    Impression:  Karen Casey is a 65 yo patient who has a known hx of chronic Xanax use as well as EtoH use. Patient is known to  have withdrawal seizures and per hx patient recently ran out of her Xanax after frequent use and was found minimally responsive with the last known EtOH use unknown.  Patient witnessed seizures were likely 2/2 to withdrawal from [facility] or Xanax or both. No AEDs recommended. Patient appears to be making expected improvements and neurological exam is returning to baseline and expected to continue to improve with extubation. Clinical team had an extensive conversation with patient and family today about tapering and eventually discontinuing use of Xanax. Also spoke with patient about [facility] use and if the patient every attempts in the future to stop drinking she should go to the hospital as she is  at increased risk for EtOH withdrawal seizures. Patient acknowledged and endorsed understanding of the conversation.  It was a bit difficult to understand patient's current use of her Xanax, PDMP noted that patient was prescribed enough for 2mg  TID; however patient reported she was taking 2mg  BID. It was also noted that patient had just had an increase in her dosage from 1mg  to 2mg . Family reported that they will work to find out if patient has been taking her 2mg  pills and if she has any left over.  Recommendations: - Xanax 2mg  BID (patient's reportedly been taking this dosage) - Recommend outpatient tapering of Xanax over the next couple of months with assistance from physician OP - Continue CIWA protocol  Seizure precautions: Per [facility] statutes, patients with seizures are not allowed to drive until they have been seizure-free for six months and cleared by a physician    Use caution when using heavy equipment or power tools. Avoid working on ladders or at heights. Take showers instead of baths. Ensure the water temperature is not too high on the home water heater. Do not go swimming alone. Do not lock yourself in a room alone (i.e. bathroom). When caring for infants or small children, sit down when holding, feeding, or changing them to minimize risk of injury to the child in the event you have a seizure. Maintain good sleep hygiene. Avoid alcohol.    If patient has another seizure, call 911 and bring them back to the ED if: A.  The seizure lasts longer than 5 minutes.      B.  The patient doesn't wake shortly after the seizure or has new problems such as difficulty seeing, speaking or moving following the seizure C.  The patient was injured during the seizure D.  The patient has a temperature over 102 F (39C) E.  The patient vomited during the seizure and now is having trouble breathing    During the Seizure   - First, ensure adequate ventilation and place patients on the floor on  their left side  Loosen clothing around the neck and ensure the airway is patent. If the patient is clenching the teeth, do not force the mouth open with any object as this can cause severe damage - Remove all items from the surrounding that can be hazardous. The patient may be oblivious to what's happening and may not even know what he or she is doing. If the patient is confused and wandering, either gently guide him/her away and block access to outside areas - Reassure the individual and be comforting - Call 911. In most cases, the seizure ends before EMS arrives. However, there are cases when seizures may last over 3 to 5 minutes. Or the individual may have developed breathing difficulties or severe injuries. If a pregnant patient or a  person with diabetes develops a seizure, it is prudent to call an ambulance. - Finally, if the patient does not regain full consciousness, then call EMS. Most patients will remain confused for about 45 to 90 minutes after a seizure, so you must use judgment in calling for help. - Avoid restraints but make sure the patient is in a bed with padded side rails - Place the individual in a lateral position with the neck slightly flexed; this will help the saliva drain from the mouth and prevent the tongue from falling backward - Remove all nearby furniture and other hazards from the area - Provide verbal assurance as the individual is regaining consciousness - Provide the patient with privacy if possible - Call for help and start treatment as ordered by the caregiver    After the Seizure (Postictal Stage)   After a seizure, most patients experience confusion, fatigue, muscle pain and/or a headache. Thus, one should permit the individual to sleep. For the next few days, reassurance is essential. Being calm and helping reorient the person is also of importance.   Most seizures are painless and end spontaneously. Seizures are not harmful to others but can lead to  complications such as stress on the lungs, brain and the heart. Individuals with prior lung problems may develop labored breathing and respiratory distress.        Damita Dunnings, MD PGY-1

## 2020-09-11 NOTE — Progress Notes (Signed)
[place] Progress Note Patient Name: Karen Casey DOB: September 14, 1955 MRN: [id number]   Date of Service  09/11/2020  HPI/Events of Note  Increasing neo gtt Poor UO Na slowly coming up  eICU Interventions  500 NS bolus     Intervention Category Major Interventions: Hypotension - evaluation and management  Daysha Ashmore V. Jaquel Coomer 09/11/2020, 1:22 AM

## 2020-09-11 NOTE — Progress Notes (Signed)
NAME:  Karen Casey, MRN:  [id number], DOB:  05-07-1956, LOS: 1 ADMISSION DATE:  09/09/2020, CONSULTATION DATE:  09/09/2020 REFERRING MD:  Calvert Cantor CHIEF COMPLAINT:  AMS, seizure, concern for EtOH withdrawal   History of Present Illness:  65 year old female with PMHx significant for depression/anxiety (chronic Xanax use with history of seizure 2/2 withdrawal 2013), EtOH abuse (drinks estimated 6-10 beers/day) and tobacco abuse (43 pack-years) who presented to [facility] ED via EMS 3/29 after being found altered at home.   Per patient's sister (present at bedside), patient frequently has periods of cyclical vomiting that last up to a week; patient was currently about 7 days into a period of vomiting where she was unable to keep down PO. Sister reports that when patient was unable to tolerate PO/EtOH, she would get upset/anxious and take additional Xanax. Patient's significant other (who lives in the home) reported that patient did not seem well and patient's daughter drove to check on the patient. On daughter's arrival, patient appeared to be altered and covered in feces with decreased responsiveness. EMS was contacted and on arrival, patient became agitated and combative, fighting EMS personnel. Patient received Versed 4mg  total en route to [facility] ED.  On arrival to [facility] ED, patient was still slightly agitated but calm enough to answer some questions for ED staff. Given AMS, she was taken for CT Head during which she experienced a seizure (witnessed, non-focal shaking movements, 3-5 minutes long per CT staff). Received a total of 4mg  Ativan. Patient was moved back to the ED minimally responsive (likely post-ictal) and not protecting her airway and was subsequently intubated by ED staff. At the time of intubation, patient was noted to have partially dried blood-tinged secretions around her mouth and a small tongue laceration was noted (likely sustained during seizure). Neurology was consulted for further  evaluation of seizure activity with recommendation for LTM. PCCM was consulted for ICU admission for AMS, seizure/likely EtOH withdrawal and ventilator management.  Pertinent  Medical History   Past Medical History:  Diagnosis Date  . Alcohol abuse   . Allergy   . Anxiety   . Cataract 06/09/2012   Right eye and left eye  . Depression   . GERD (gastroesophageal reflux disease)   . 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 Seizures ([place])    xanax withdrawl- December 2013  . Urinary incontinence 06/09/2012   Significant Hospital Events: Including procedures, antibiotic start and stop dates in addition to other pertinent events   . 3/29 - Presented to [facility] ED via EMS for AMS. C/f EtOH/benzo withdrawal. Seizure x 3-5 minutes with generalized "shaking" in CT scanner, tongue lac. Post-ictal, unable to protect airway and intubated. Neuro consulted for seizure, PCCM consult for admission for EtOH withdrawal. CIWA, LTM initiated. . 3/30 - EEG neg seizure. Unable to extubate due to fatigue, diaphoresis, emesis.  Interim History / Subjective:  Required increased neo for hypotension overnight. Tolerating PS. Awake, alert. Family at bedside  Objective   Blood pressure 125/75, pulse 60, temperature 98.7 F (37.1 C), temperature source Oral, resp. rate (!) 21, height 5\' 6"  (1.676 m), weight 94.9 kg, SpO2 97 %.    Vent Mode: PSV;CPAP FiO2 (%):  [40 %-50 %] 40 % Set Rate:  [18 bmp] 18 bmp Vt Set:  [470 mL] 470 mL PEEP:  [5 cmH20] 5 cmH20 Pressure Support:  [10 cmH20] 10 cmH20 Plateau Pressure:  [18 cmH20-30 cmH20] 30 cmH20   Intake/Output Summary (Last 24 hours)  at 09/11/2020 0839 Last data filed at 09/11/2020 0600 Gross per 24 hour  Intake 5721 ml  Output 558 ml  Net 5163 ml   Filed Weights   09/10/20 0000 09/10/20 0245 09/11/20 0452  Weight: 88 kg 88.9 kg 94.9 kg   Physical Exam: General: Chronically ill-appearing, no acute distress HENT: [place], AT, ETT in place Eyes:  EOMI, no scleral icterus Respiratory: Diminished breath sounds bilaterally.  No crackles, wheezing or rales Cardiovascular: RRR, -M/R/G, no JVD GI: BS+, soft, nontender Extremities:-Edema,-tenderness Neuro: Awake, alert, follows commands, moves extremities with equal strength bilaterally GU: Foley in place   Labs/imaging that I have personally reviewed: (right click and "Reselect all SmartList Selections" daily)  WBC 20>19.2 Na 119>124 K 2.9 BUN/Cr 6/1.08 LA 1.7  CXR 3/29 - ETT in place. Bilateral reticular opacities CT Head 3/30 - Normal  Resolved Hospital Problem list   AKI Acute metabolic encephalopathy  Assessment & Plan:   Seizure likely secondary to EtOH/benzo withdrawal Presented to [facility] ED via EMS for AMS at home. History of EtOH abuse and benzo use (Xanax, prescribed by psychiatrist). Of note, patient has a history of seizure 2/2 benzo withdrawal (2013). Per patient's sister, drinks 6-10 beers/day at baseline; unable to tolerate PO for one week prior to admission and had taken additional Xanax/run out. Witnessed seizure in CT scanner x 3-5 minutes 3/29PM with subsequent post-ictal state requiring intubation. - Neuro following, appreciate recommendations - No role for AEDs at this time, per Neuro (given likelihood of EtOH withdrawal as etiology of seizure) - CIWA protocol + thiamine/folate - ICU admission for close monitoring, wean Precedex  Acute hypercapnic and hypoxemic respiratory failure in the setting of seizure and AMS Intubated after seizure 3/29, given persistent post-ictal state/AMS and inability to protect airway. - Tolerating PS. Hold on extubation until hemodynamically stable - Daily WUA/SBT - Continue full vent support (6-8cc/kg IBW) - Wean FiO2 for O2 sat > 90% - VAP bundle - Pulmonary hygiene - PAD protocol for sedation: Precedex and Fentanyl for goal RASS -1 to -2   Septic shock unclear etiology however suspect aspiration with recent emesis Sedation  related hypotension - Continue Rocephin D2 - Transition from Neo to Levophed for MAP goal >65 - IVF bolus now - Wean Precedex - Follow-up cultures. Respiratory Cx GS with mod GPC  Atrial fibrillation -incited by sepsis, electrolytes and dehydration - Currently NSR - Echo wnl - No need for AC at this time - Correct electrolytes for K >4 and Mg >2  Hypovolemic hyponatremia - improving Persistent nausea/vomiting x 1 week PTA, per patient's sister. Inability to tolerate PO. Hgb 17, patient likely intravascularly dry/volume down. - IVF bolus  Hypokalemia Hypomagnesemia - Replete K - Trend BMET  Anxiety - Restart home celexa, gabapentin and seroquel - Hold on cymbalta and abilify  Best practice (right click and "Reselect all SmartList Selections" daily)  Diet:  Tube Feed  Pain/Anxiety/Delirium protocol (if indicated): Yes (RASS goal -1,-2) VAP protocol (if indicated): Yes DVT prophylaxis: Subcutaneous Heparin GI prophylaxis: PPI Glucose control:  SSI Yes Central venous access:  N/A Arterial line:  N/A Foley:  Yes, and it is still needed Mobility:  bed rest  PT consulted: Yes Last date of multidisciplinary goals of care discussion [3/29] Code Status:  full code Disposition: ICU  Labs   CBC: Recent Labs  Lab 09/09/20 2147 09/10/20 0028 09/10/20 0303 09/11/20 0447  WBC 20.9*  --  19.2* 17.4*  NEUTROABS 19.0*  --   --   --  HGB 17.7* 17.0* 17.0* 13.2  HCT 48.7* 50.0* 47.5* 39.1  MCV 91.2  --  91.9 97.0  PLT 209  --  199 827   Basic Metabolic Panel: Recent Labs  Lab 09/09/20 2147 09/10/20 0028 09/10/20 0303 09/10/20 0529 09/10/20 0743 09/10/20 0937 09/10/20 1223 09/11/20 0447  NA 119* 120* 120* 124* 124* 123* 126* 128*  K 3.5 2.6* 2.9*  --   --  3.8  --  3.6  CL 81*  --  82*  --   --  92*  --  101  CO2 23  --  28  --   --  21*  --  21*  GLUCOSE 118*  --  116*  --   --  120*  --  112*  BUN <5*  --  6*  --   --  7*  --  13  CREATININE 0.78  --  1.08*  --    --  1.05*  --  0.70  CALCIUM 9.2  --  9.3  --   --  8.2*  --  7.9*  MG  --   --  1.5*  --   --   --   --  2.2  PHOS  --   --  3.8  --   --   --   --  2.9   GFR: Estimated Creatinine Clearance: 84.6 mL/min (by C-G formula based on SCr of 0.7 mg/dL). Recent Labs  Lab 09/09/20 2147 09/10/20 0303 09/10/20 0309 09/11/20 0447  WBC 20.9* 19.2*  --  17.4*  LATICACIDVEN 2.3*  --  1.7  --    Liver Function Tests: Recent Labs  Lab 09/09/20 2147 09/10/20 0303  AST 46* 45*  ALT 34 32  ALKPHOS 66 68  BILITOT 1.7* 1.6*  PROT 7.0 7.0  ALBUMIN 4.0 3.8   No results for input(s): LIPASE, AMYLASE in the last 168 hours. Recent Labs  Lab 09/09/20 2147  AMMONIA 30   ABG:    Component Value Date/Time   PHART 7.382 09/10/2020 0028   PCO2ART 49.9 (H) 09/10/2020 0028   PO2ART 248 (H) 09/10/2020 0028   HCO3 29.4 (H) 09/10/2020 0028   TCO2 31 09/10/2020 0028   O2SAT 100.0 09/10/2020 0028    Coagulation Profile: Recent Labs  Lab 09/09/20 2147  INR 1.2   Cardiac Enzymes: No results for input(s): CKTOTAL, CKMB, CKMBINDEX, TROPONINI in the last 168 hours.  HbA1C: Hemoglobin A1C  Date/Time Value Ref Range Status  02/13/2013 10:13 AM 5.2  Final   Hgb A1c MFr Bld  Date/Time Value Ref Range Status  06/20/2020 02:31 PM 5.3 4.8 - 5.6 % Final    Comment:             Prediabetes: 5.7 - 6.4          Diabetes: >6.4          Glycemic control for adults with diabetes: <7.0    CBG: Recent Labs  Lab 09/10/20 1821 09/10/20 1912 09/10/20 2325 09/11/20 0304 09/11/20 0708  GLUCAP 94 105* 119* 115* 115*    Critical care time: 45 minutes   Ziona Wickens Rodman Pickle, MD [facility] 09/11/20 8:39 AM  Please see [email or web address] for pager details.

## 2020-09-11 NOTE — Progress Notes (Signed)
eLink Physician-Brief Progress Note Patient Name: Karen Casey DOB: 02/12/1956 MRN: [id number]   Date of Service  09/11/2020  HPI/Events of Note  Wheezing - Nursing request for Albuterol nebs.   eICU Interventions  Plan: 1. Albuterol 2.5 mg via neb Q 3 hours PRN wheezing or SOB.      Intervention Category Major Interventions: Other:  Lysle Dingwall 09/11/2020, 11:52 PM

## 2020-09-12 DIAGNOSIS — J9601 Acute respiratory failure with hypoxia: Secondary | ICD-10-CM | POA: Diagnosis not present

## 2020-09-12 DIAGNOSIS — F10231 Alcohol dependence with withdrawal delirium: Principal | ICD-10-CM

## 2020-09-12 DIAGNOSIS — R569 Unspecified convulsions: Secondary | ICD-10-CM | POA: Diagnosis not present

## 2020-09-12 LAB — POCT I-STAT 7, (LYTES, BLD GAS, ICA,H+H)
Acid-Base Excess: 0 mmol/L (ref 0.0–2.0)
Bicarbonate: 26.2 mmol/L (ref 20.0–28.0)
Calcium, Ion: 1.27 mmol/L (ref 1.15–1.40)
HCT: 33 % — ABNORMAL LOW (ref 36.0–46.0)
Hemoglobin: 11.2 g/dL — ABNORMAL LOW (ref 12.0–15.0)
O2 Saturation: 95 %
Potassium: 4.8 mmol/L (ref 3.5–5.1)
Sodium: 132 mmol/L — ABNORMAL LOW (ref 135–145)
TCO2: 28 mmol/L (ref 22–32)
pCO2 arterial: 48 mmHg (ref 32.0–48.0)
pH, Arterial: 7.344 — ABNORMAL LOW (ref 7.350–7.450)
pO2, Arterial: 82 mmHg — ABNORMAL LOW (ref 83.0–108.0)

## 2020-09-12 LAB — CBC
HCT: 35.9 % — ABNORMAL LOW (ref 36.0–46.0)
HCT: 35.8 % — ABNORMAL LOW (ref 36.0–46.0)
Hemoglobin: 11.9 g/dL — ABNORMAL LOW (ref 12.0–15.0)
Hemoglobin: 11.9 g/dL — ABNORMAL LOW (ref 12.0–15.0)
MCH: 33.3 pg (ref 26.0–34.0)
MCH: 33.1 pg (ref 26.0–34.0)
MCHC: 33.1 g/dL (ref 30.0–36.0)
MCHC: 33.2 g/dL (ref 30.0–36.0)
MCV: 100.6 fL — ABNORMAL HIGH (ref 80.0–100.0)
MCV: 99.7 fL (ref 80.0–100.0)
Platelets: 135 10*3/uL — ABNORMAL LOW (ref 150–400)
Platelets: 142 10*3/uL — ABNORMAL LOW (ref 150–400)
RBC: 3.57 MIL/uL — ABNORMAL LOW (ref 3.87–5.11)
RBC: 3.59 MIL/uL — ABNORMAL LOW (ref 3.87–5.11)
RDW: 13.1 % (ref 11.5–15.5)
RDW: 13.2 % (ref 11.5–15.5)
WBC: 10.5 10*3/uL (ref 4.0–10.5)
WBC: 9.8 10*3/uL (ref 4.0–10.5)
nRBC: 0 % (ref 0.0–0.2)
nRBC: 0 % (ref 0.0–0.2)

## 2020-09-12 LAB — CULTURE, RESPIRATORY W GRAM STAIN
Culture: NORMAL
Gram Stain: NONE SEEN

## 2020-09-12 LAB — BASIC METABOLIC PANEL
Anion gap: 5 (ref 5–15)
Anion gap: 5 (ref 5–15)
BUN: 14 mg/dL (ref 8–23)
BUN: 14 mg/dL (ref 8–23)
CO2: 24 mmol/L (ref 22–32)
CO2: 25 mmol/L (ref 22–32)
Calcium: 8.3 mg/dL — ABNORMAL LOW (ref 8.9–10.3)
Calcium: 8.4 mg/dL — ABNORMAL LOW (ref 8.9–10.3)
Chloride: 102 mmol/L (ref 98–111)
Chloride: 101 mmol/L (ref 98–111)
Creatinine, Ser: 0.65 mg/dL (ref 0.44–1.00)
Creatinine, Ser: 0.64 mg/dL (ref 0.44–1.00)
GFR, Estimated: >60 mL/min (ref >60)
GFR, Estimated: >60 mL/min (ref >60)
Glucose, Bld: 149 mg/dL — ABNORMAL HIGH (ref 70–99)
Glucose, Bld: 129 mg/dL — ABNORMAL HIGH (ref 70–99)
Potassium: 3.5 mmol/L (ref 3.5–5.1)
Potassium: 4.4 mmol/L (ref 3.5–5.1)
Sodium: 131 mmol/L — ABNORMAL LOW (ref 135–145)
Sodium: 131 mmol/L — ABNORMAL LOW (ref 135–145)

## 2020-09-12 LAB — PHOSPHORUS
Phosphorus: 2.6 mg/dL (ref 2.5–4.6)
Phosphorus: 2.9 mg/dL (ref 2.5–4.6)

## 2020-09-12 LAB — GLUCOSE, CAPILLARY
Glucose-Capillary: 104 mg/dL — ABNORMAL HIGH (ref 70–99)
Glucose-Capillary: 112 mg/dL — ABNORMAL HIGH (ref 70–99)
Glucose-Capillary: 117 mg/dL — ABNORMAL HIGH (ref 70–99)
Glucose-Capillary: 155 mg/dL — ABNORMAL HIGH (ref 70–99)
Glucose-Capillary: 155 mg/dL — ABNORMAL HIGH (ref 70–99)
Glucose-Capillary: 97 mg/dL (ref 70–99)

## 2020-09-12 LAB — MAGNESIUM
Magnesium: 1.8 mg/dL (ref 1.7–2.4)
Magnesium: 2.2 mg/dL (ref 1.7–2.4)

## 2020-09-12 LAB — TSH: TSH: 0.827 u[IU]/mL (ref 0.350–4.500)

## 2020-09-12 MED ORDER — ORAL CARE MOUTH RINSE
15.0000 mL | Freq: Two times a day (BID) | OROMUCOSAL | Status: DC
  Administered 2020-09-12 – 2020-09-22 (×16): 15 mL via OROMUCOSAL

## 2020-09-12 MED ORDER — IPRATROPIUM-ALBUTEROL 0.5-2.5 (3) MG/3ML IN SOLN
3.0000 mL | RESPIRATORY_TRACT | Status: DC
  Administered 2020-09-12 – 2020-09-14 (×10): 3 mL via RESPIRATORY_TRACT
  Filled 2020-09-12 (×9): qty 3

## 2020-09-12 MED ORDER — METOPROLOL TARTRATE 5 MG/5ML IV SOLN
5.0000 mg | INTRAVENOUS | Status: DC | PRN
  Administered 2020-09-12 – 2020-09-16 (×11): 5 mg via INTRAVENOUS
  Filled 2020-09-12 (×11): qty 5

## 2020-09-12 MED ORDER — MAGNESIUM SULFATE 2 GM/50ML IV SOLN
2.0000 g | Freq: Once | INTRAVENOUS | Status: AC
  Administered 2020-09-12: 2 g via INTRAVENOUS
  Filled 2020-09-12: qty 50

## 2020-09-12 MED ORDER — POTASSIUM CHLORIDE 20 MEQ PO PACK
40.0000 meq | PACK | Freq: Once | ORAL | Status: AC
  Administered 2020-09-12: 40 meq
  Filled 2020-09-12: qty 2

## 2020-09-12 MED ORDER — CHLORHEXIDINE GLUCONATE 0.12 % MT SOLN
15.0000 mL | Freq: Two times a day (BID) | OROMUCOSAL | Status: DC
  Administered 2020-09-12 – 2020-09-22 (×20): 15 mL via OROMUCOSAL
  Filled 2020-09-12 (×16): qty 15

## 2020-09-12 NOTE — Progress Notes (Signed)
NAME:  Karen Casey, MRN:  [id number], DOB:  July 23, 1955, LOS: 2 ADMISSION DATE:  09/09/2020, CONSULTATION DATE:  09/09/2020 REFERRING MD:  Calvert Cantor CHIEF COMPLAINT:  AMS, seizure, concern for EtOH withdrawal   History of Present Illness:  65 year old female with PMHx significant for depression/anxiety (chronic Xanax use with history of seizure 2/2 withdrawal 2013), EtOH abuse (drinks estimated 6-10 beers/day) and tobacco abuse (43 pack-years) who presented to [facility] ED via EMS 3/29 after being found altered at home.   Per patient's sister (present at bedside), patient frequently has periods of cyclical vomiting that last up to a week; patient was currently about 7 days into a period of vomiting where she was unable to keep down PO. Sister reports that when patient was unable to tolerate PO/EtOH, she would get upset/anxious and take additional Xanax. Patient's significant other (who lives in the home) reported that patient did not seem well and patient's daughter drove to check on the patient. On daughter's arrival, patient appeared to be altered and covered in feces with decreased responsiveness. EMS was contacted and on arrival, patient became agitated and combative, fighting EMS personnel. Patient received Versed 4mg  total en route to [facility] ED.  On arrival to [facility] ED, patient was still slightly agitated but calm enough to answer some questions for ED staff. Given AMS, she was taken for CT Head during which she experienced a seizure (witnessed, non-focal shaking movements, 3-5 minutes long per CT staff). Received a total of 4mg  Ativan. Patient was moved back to the ED minimally responsive (likely post-ictal) and not protecting her airway and was subsequently intubated by ED staff. At the time of intubation, patient was noted to have partially dried blood-tinged secretions around her mouth and a small tongue laceration was noted (likely sustained during seizure). Neurology was consulted for further  evaluation of seizure activity with recommendation for LTM. PCCM was consulted for ICU admission for AMS, seizure/likely EtOH withdrawal and ventilator management.  Pertinent  Medical History   Past Medical History:  Diagnosis Date  . Alcohol abuse   . Allergy   . Anxiety   . Cataract 06/09/2012   Right eye and left eye  . Depression   . GERD (gastroesophageal reflux disease)   . 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 Seizures ([place])    xanax withdrawl- December 2013  . Urinary incontinence 06/09/2012   Significant Hospital Events: Including procedures, antibiotic start and stop dates in addition to other pertinent events   . 3/29 - Presented to [facility] ED via EMS for AMS. C/f EtOH/benzo withdrawal. Seizure x 3-5 minutes with generalized "shaking" in CT scanner, tongue lac. Post-ictal, unable to protect airway and intubated. Neuro consulted for seizure, PCCM consult for admission for EtOH withdrawal. CIWA, LTM initiated. . 3/30 - EEG neg seizure. Unable to extubate due to fatigue, diaphoresis, emesis.  Interim History / Subjective:   Transition from Neo-Synephrine to Levophed yesterday Low adequate urine output, 10 L positive Afebrile. Remains critically ill, intubated and sedated  Objective   Blood pressure (!) 80/58, pulse 71, temperature 98.1 F (36.7 C), temperature source Oral, resp. rate 19, height 5\' 6"  (1.676 m), weight 94.9 kg, SpO2 95 %.    Vent Mode: PRVC FiO2 (%):  [40 %] 40 % Set Rate:  [18 bmp] 18 bmp Vt Set:  [470 mL] 470 mL PEEP:  [5 cmH20] 5 cmH20 Pressure Support:  [10 cmH20] 10 cmH20 Plateau Pressure:  [20 cmH20-28 cmH20] 28 cmH20  Intake/Output Summary (Last 24 hours) at 09/12/2020 0837 Last data filed at 09/12/2020 0800 Gross per 24 hour  Intake 4405.57 ml  Output 990 ml  Net 3415.57 ml   Filed Weights   09/10/20 0000 09/10/20 0245 09/11/20 0452  Weight: 88 kg 88.9 kg 94.9 kg   Physical Exam: General: Chronically ill-appearing, no  acute distress HENT: [place], AT, ETT in place Eyes: EOMI, no scleral icterus Respiratory: Diminished breath sounds bilaterally.  No crackles, wheezing or rales Cardiovascular: RRR, -M/R/G, no JVD GI: BS+, soft, nontender Extremities:-Edema,-tenderness Neuro: Awake, alert, follows commands, moves extremities with equal strength bilaterally GU: Foley in place  Chest x-ray 3/31 independently reviewed which shows bilateral stable multifocal opacities  Labs/imaging that I have personally reviewed: (right click and "Reselect all SmartList Selections" daily)  Labs showed resolved leukocytosis, improving hyponatremia, mild hypokalemia   resp cx 3/30 >> ng CT Head 3/30 - Normal  Resolved Hospital Problem list   AKI Acute metabolic encephalopathy  Assessment & Plan:   Seizure likely secondary to EtOH/benzo withdrawal Presented to [facility] ED via EMS for AMS at home. History of EtOH abuse and benzo use (Xanax, prescribed by psychiatrist). Of note, patient has a history of seizure 2/2 benzo withdrawal (2013). Per patient's sister, drinks 6-10 beers/day at baseline; unable to tolerate PO for one week prior to admission and had taken additional Xanax/run out. Witnessed seizure in CT scanner x 3-5 minutes 3/29PM with subsequent post-ictal state requiring intubation.  - No role for AEDs at this time, per Neuro  - CIWA protocol + thiamine/folate - ICU  monitoring, wean Precedex , DC fentanyl -- Neuro recommendations - 'Continue Xanax 2mg  BID for now(home dose) along with CIWA protocol. Once out of withdrawal window, plan for slow prolonged outpatient Xanax taper over a couple months. Discussed no driving, discussed stop drinking EtOH. If she does go back to drinking EtOH, recommend that she not try to quit at home and instead come to the hospital for managed withdrawal given seizure. '  Acute hypercapnic and hypoxemic respiratory failure in the setting of seizure and AMS Intubated after seizure 3/29, given  persistent post-ictal state/AMS and inability to protect airway. -Spontaneous breathing trial, tolerating pressure support 10/5, dropped to 5/5 and if tolerated proceed with extubation  Septic shock unclear etiology however suspect aspiration with recent emesis Sedation related hypotension - Continue Rocephin D3 -plan for 5 to 7 days -Wean Levophed for MAP goal >65   Atrial fibrillation -incited by sepsis, electrolytes and dehydration Elevated troponin - Currently NSR - Echo wnl - No need for AC at this time - Correct electrolytes for K >4 and Mg >2  Hypovolemic hyponatremia - improving Persistent nausea/vomiting x 1 week PTA, per patient's sister. Inability to tolerate PO. Hgb 17, patient likely intravascularly dry/volume down. -Sodium improving as expected  Hypokalemia Hypomagnesemia - Replete K   Anxiety - Restart home celexa, gabapentin and seroquel -Resume home Xanax 2 twice daily - Hold on cymbalta and abilify  Best practice (right click and "Reselect all SmartList Selections" daily)  Diet:  Tube Feed  Pain/Anxiety/Delirium protocol (if indicated): Y, RASS 0 VAP protocol (if indicated): Yes DVT prophylaxis: Subcutaneous Heparin GI prophylaxis: PPI Glucose control:  SSI Yes Central venous access:  N/A Arterial line:  N/A Foley:  Yes, and it is still needed Mobility:  bed rest  PT consulted: Yes Last date of multidisciplinary goals of care discussion [3/29] Code Status:  full code Disposition: ICU  Sister updated at bedside  Labs  CBC: Recent Labs  Lab 09/09/20 2147 09/10/20 0028 09/10/20 0303 09/11/20 0447 09/12/20 0116  WBC 20.9*  --  19.2* 17.4* 10.5  NEUTROABS 19.0*  --   --   --   --   HGB 17.7* 17.0* 17.0* 13.2 11.9*  HCT 48.7* 50.0* 47.5* 39.1 35.9*  MCV 91.2  --  91.9 97.0 100.6*  PLT 209  --  199 165 341*   Basic Metabolic Panel: Recent Labs  Lab 09/09/20 2147 09/10/20 0028 09/10/20 0303 09/10/20 0529 09/10/20 0743 09/10/20 0937  09/10/20 1223 09/11/20 0447 09/12/20 0116  NA 119* 120* 120*   < > 124* 123* 126* 128* 131*  K 3.5 2.6* 2.9*  --   --  3.8  --  3.6 3.5  CL 81*  --  82*  --   --  92*  --  101 102  CO2 23  --  28  --   --  21*  --  21* 24  GLUCOSE 118*  --  116*  --   --  120*  --  112* 149*  BUN <5*  --  6*  --   --  7*  --  13 14  CREATININE 0.78  --  1.08*  --   --  1.05*  --  0.70 0.65  CALCIUM 9.2  --  9.3  --   --  8.2*  --  7.9* 8.3*  MG  --   --  1.5*  --   --   --   --  2.2 1.8  PHOS  --   --  3.8  --   --   --   --  2.9 2.6   < > = values in this interval not displayed.   GFR: Estimated Creatinine Clearance: 84.6 mL/min (by C-G formula based on SCr of 0.65 mg/dL). Recent Labs  Lab 09/09/20 2147 09/10/20 0303 09/10/20 0309 09/11/20 0447 09/12/20 0116  WBC 20.9* 19.2*  --  17.4* 10.5  LATICACIDVEN 2.3*  --  1.7  --   --    Liver Function Tests: Recent Labs  Lab 09/09/20 2147 09/10/20 0303  AST 46* 45*  ALT 34 32  ALKPHOS 66 68  BILITOT 1.7* 1.6*  PROT 7.0 7.0  ALBUMIN 4.0 3.8   No results for input(s): LIPASE, AMYLASE in the last 168 hours. Recent Labs  Lab 09/09/20 2147  AMMONIA 30   ABG:    Component Value Date/Time   PHART 7.382 09/10/2020 0028   PCO2ART 49.9 (H) 09/10/2020 0028   PO2ART 248 (H) 09/10/2020 0028   HCO3 29.4 (H) 09/10/2020 0028   TCO2 31 09/10/2020 0028   O2SAT 100.0 09/10/2020 0028    Coagulation Profile: Recent Labs  Lab 09/09/20 2147  INR 1.2   Cardiac Enzymes: No results for input(s): CKTOTAL, CKMB, CKMBINDEX, TROPONINI in the last 168 hours.  HbA1C: Hemoglobin A1C  Date/Time Value Ref Range Status  02/13/2013 10:13 AM 5.2  Final   Hgb A1c MFr Bld  Date/Time Value Ref Range Status  06/20/2020 02:31 PM 5.3 4.8 - 5.6 % Final    Comment:             Prediabetes: 5.7 - 6.4          Diabetes: >6.4          Glycemic control for adults with diabetes: <7.0    CBG: Recent Labs  Lab 09/11/20 1511 09/11/20 1908 09/11/20 2326  09/12/20 0327 09/12/20 0717  GLUCAP 133*  138* 134* 155* 155*    Critical care time: 35 minutes   Keiton Cosma V. Elsworth Soho, MD [facility] 09/12/20 8:37 AM

## 2020-09-12 NOTE — Progress Notes (Signed)
Elink replaced K+ 3.5 and Magnesium 1.8 per eLink electrolyte replacement protocol.  GFR >60, creatinine 0.65

## 2020-09-12 NOTE — Progress Notes (Signed)
After patient's bath approx. 1400 patient went into A-fib RVR. EKG obtained. MD notified. Metoprolol 5 mg ordered and given. Will continue to monitor.

## 2020-09-12 NOTE — Progress Notes (Signed)
Extubated - mild pseudowheeze & rhonchi  >> albuterol nebs Satn improved AF-RVR - obtain EKG, lopressor prn, if persistent will use cardizem gtt & start heparin  Karen Casey V. Elsworth Soho MD

## 2020-09-12 NOTE — Procedures (Signed)
Extubation Procedure Note  Patient Details:   Name: Karen Casey DOB: May 29, 1956 MRN: [id number]   Airway Documentation:    Vent end date: 09/12/20 Vent end time: 0934   Evaluation  O2 sats: stable throughout Complications: No apparent complications Patient did tolerate procedure well. Bilateral Breath Sounds: Expiratory wheezes   Yes  Jesse Sans 09/12/2020, 9:38 AM

## 2020-09-12 NOTE — Plan of Care (Signed)
  Problem: Clinical Measurements: Goal: Will remain free from infection Outcome: Progressing Goal: Diagnostic test results will improve Outcome: Progressing Goal: Respiratory complications will improve Outcome: Progressing Goal: Cardiovascular complication will be avoided Outcome: Progressing   Problem: Nutrition: Goal: Adequate nutrition will be maintained Outcome: Progressing   Problem: Coping: Goal: Level of anxiety will decrease Outcome: Progressing   Problem: Elimination: Goal: Will not experience complications related to bowel motility Outcome: Progressing Goal: Will not experience complications related to urinary retention Outcome: Progressing   Problem: Pain Managment: Goal: General experience of comfort will improve Outcome: Progressing   Problem: Safety: Goal: Ability to remain free from injury will improve Outcome: Progressing   Problem: Skin Integrity: Goal: Risk for impaired skin integrity will decrease Outcome: Progressing   

## 2020-09-12 NOTE — Evaluation (Signed)
Clinical/Bedside Swallow Evaluation Patient Details  Name: Karen Casey MRN: [id number] Date of Birth: 01-08-1956  Today's Date: 09/12/2020 Time: SLP Start Time (ACUTE ONLY): 4098 SLP Stop Time (ACUTE ONLY): 1741 SLP Time Calculation (min) (ACUTE ONLY): 20.18 min  Past Medical History:  Past Medical History:  Diagnosis Date  . Alcohol abuse   . Allergy   . Anxiety   . Cataract 06/09/2012   Right eye and left eye  . Depression   . GERD (gastroesophageal reflux disease)   . 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 Seizures ([place])    xanax withdrawl- December 2013  . Urinary incontinence 06/09/2012   Past Surgical History:  Past Surgical History:  Procedure Laterality Date  . APPENDECTOMY    . CATARACT EXTRACTION  06/09/2012   Left eye  . left shoulder dislocation  Sept 2011   HPI:  Pt is a 65 year old female with PMHx significant for depression/anxiety (chronic Xanax use with history of seizure 2/2 withdrawal 2013), EtOH abuse (drinks estimated 6-10 beers/day) and tobacco abuse who presented to [facility] ED on 3/29 after being found altered at home. On admission pt's family reported periods of cyclical vomiting that last up to a week. Pt had witnessed seizure in CT (non-focal shaking movements, 3-5 minutes long per CT staff). Pt received a total of 4mg  Ativan and, upon return to ED was minimally responsive (likely post-ictal) and not protecting her airway. ETT 3/29-4/1 [phone]). CXR 3/31: Progressive left basilar consolidation. Progressive pulmonary hypoinflation.   Assessment / Plan / Recommendation Clinical Impression  Pt was seen for bedside swallow evaluation and she denied a history of dysphagia. Vocal quality was breathy and vocal intensity reduced, suggesting vocal fold insufficiency and increasing aspiration risk. Sores were noted on the superior and lateral lingual surfaces and her tongue appeared slightly enlarged which could both be attributed to pt having bit  her tongue as was noted by neurology on 3/29.  Oral motor strength and ROM appeared grossly WFL and dentition was adequate. Pt's SpO2 was 95-100% and RR in the mid 20s. However, pt demonstrated increased WOB throughout the assessment. Trials were limited for this reason and due to her performance. She demonstrated symptoms of pharyngeal dysphagia characterized by multiple swallows and consistent signs of aspiration (i.e., coughing) with quarter-tsp boluses of puree, with thin liquids via tsp and with full-tsp boluses of ice chips. Pt tolerated small, individual ice chips without overt s/sx of aspiration. Pt reported odynophagia with all consistencies which she rated 5/10. Pt is at increased aspiration risk at this time in the setting of prolonged intubation and symptoms of vocal fold insufficiency. It is recommended that the pt's NPO status be maintained and that meds be provided via  alternate means. Pt may have limited, individual ice chips following thorough oral care. SLP will follow to assess improvement in swallow function. SLP Visit Diagnosis: Dysphagia, unspecified (R13.10)    Aspiration Risk  Moderate aspiration risk    Diet Recommendation NPO (pt may have small, individual ice chips following oral care.)   Medication Administration: Via alternative means    Other  Recommendations Oral Care Recommendations: Oral care QID;Oral care prior to ice chip/H20   Follow up Recommendations  (TBD)      Frequency and Duration min 2x/week  2 weeks       Prognosis Prognosis for Safe Diet Advancement: Good Barriers to Reach Goals: Severity of deficits      Swallow Study   General Date of  Onset: 09/12/20 HPI: Pt is a 65 year old female with PMHx significant for depression/anxiety (chronic Xanax use with history of seizure 2/2 withdrawal 2013), EtOH abuse (drinks estimated 6-10 beers/day) and tobacco abuse who presented to [facility] ED on 3/29 after being found altered at home. On admission pt's family  reported periods of cyclical vomiting that last up to a week. Pt had witnessed seizure in CT (non-focal shaking movements, 3-5 minutes long per CT staff). Pt received a total of 4mg  Ativan and, upon return to ED was minimally responsive (likely post-ictal) and not protecting her airway. ETT 3/29-4/1 [phone]). CXR 3/31: Progressive left basilar consolidation. Progressive pulmonary hypoinflation. Type of Study: Bedside Swallow Evaluation Previous Swallow Assessment: none Diet Prior to this Study: NPO Temperature Spikes Noted: No Respiratory Status: Nasal cannula History of Recent Intubation: Yes Length of Intubations (days): 3 days Date extubated: 09/12/20 Behavior/Cognition: Alert;Cooperative;Pleasant mood Oral Cavity Assessment: Within Functional Limits Oral Care Completed by SLP: No Oral Cavity - Dentition: Adequate natural dentition Vision: Functional for self-feeding Self-Feeding Abilities: Needs assist Patient Positioning: Upright in bed;Postural control adequate for testing Baseline Vocal Quality: Low vocal intensity;Breathy Volitional Cough: Weak Volitional Swallow: Able to elicit    Oral/Motor/Sensory Function Overall Oral Motor/Sensory Function: Within functional limits   Ice Chips Ice chips: Impaired Presentation: Spoon Pharyngeal Phase Impairments: Cough - Delayed;Multiple swallows   Thin Liquid Thin Liquid: Impaired Presentation: Spoon Pharyngeal  Phase Impairments: Multiple swallows;Cough - Delayed    Nectar Thick Nectar Thick Liquid: Not tested   Honey Thick Honey Thick Liquid: Not tested   Puree Puree: Impaired Presentation: Spoon Pharyngeal Phase Impairments: Multiple swallows   Solid    Bob Eastwood I. Hardin Negus, [place], [place] Office number [phone] Pager [phone]  Solid: Not tested      Horton Marshall 09/12/2020,5:54 PM

## 2020-09-12 NOTE — Progress Notes (Signed)
eLink Physician-Brief Progress Note Patient Name: Karen Casey DOB: [birth year] MRN: [id number]   Date of Service  09/12/2020  HPI/Events of Note  Received renewal of Precedex as it expires in 3 hours and patient still with episodes of agitation  eICU Interventions  Precedex renewed     Intervention Category Minor Interventions: Agitation / anxiety - evaluation and management  Judd Lien 09/12/2020, 10:12 PM

## 2020-09-13 DIAGNOSIS — R569 Unspecified convulsions: Secondary | ICD-10-CM | POA: Diagnosis not present

## 2020-09-13 DIAGNOSIS — J9601 Acute respiratory failure with hypoxia: Secondary | ICD-10-CM | POA: Diagnosis not present

## 2020-09-13 DIAGNOSIS — F10231 Alcohol dependence with withdrawal delirium: Secondary | ICD-10-CM | POA: Diagnosis not present

## 2020-09-13 LAB — BASIC METABOLIC PANEL
Anion gap: 7 (ref 5–15)
BUN: 9 mg/dL (ref 8–23)
CO2: 25 mmol/L (ref 22–32)
Calcium: 8.5 mg/dL — ABNORMAL LOW (ref 8.9–10.3)
Chloride: 99 mmol/L (ref 98–111)
Creatinine, Ser: 0.62 mg/dL (ref 0.44–1.00)
GFR, Estimated: >60 mL/min (ref >60)
Glucose, Bld: 99 mg/dL (ref 70–99)
Potassium: 4.4 mmol/L (ref 3.5–5.1)
Sodium: 131 mmol/L — ABNORMAL LOW (ref 135–145)

## 2020-09-13 LAB — GLUCOSE, CAPILLARY
Glucose-Capillary: 111 mg/dL — ABNORMAL HIGH (ref 70–99)
Glucose-Capillary: 112 mg/dL — ABNORMAL HIGH (ref 70–99)
Glucose-Capillary: 117 mg/dL — ABNORMAL HIGH (ref 70–99)
Glucose-Capillary: 82 mg/dL (ref 70–99)
Glucose-Capillary: 87 mg/dL (ref 70–99)
Glucose-Capillary: 95 mg/dL (ref 70–99)

## 2020-09-13 LAB — PHOSPHORUS: Phosphorus: 3.1 mg/dL (ref 2.5–4.6)

## 2020-09-13 LAB — CBC
HCT: 35.4 % — ABNORMAL LOW (ref 36.0–46.0)
Hemoglobin: 11.6 g/dL — ABNORMAL LOW (ref 12.0–15.0)
MCH: 33.7 pg (ref 26.0–34.0)
MCHC: 32.8 g/dL (ref 30.0–36.0)
MCV: 102.9 fL — ABNORMAL HIGH (ref 80.0–100.0)
Platelets: 142 10*3/uL — ABNORMAL LOW (ref 150–400)
RBC: 3.44 MIL/uL — ABNORMAL LOW (ref 3.87–5.11)
RDW: 13.1 % (ref 11.5–15.5)
WBC: 7.9 10*3/uL (ref 4.0–10.5)
nRBC: 0 % (ref 0.0–0.2)

## 2020-09-13 LAB — MAGNESIUM: Magnesium: 1.8 mg/dL (ref 1.7–2.4)

## 2020-09-13 MED ORDER — FOLIC ACID 1 MG PO TABS
1.0000 mg | ORAL_TABLET | Freq: Every day | ORAL | Status: DC
Start: 2020-09-14 — End: 2020-09-22
  Administered 2020-09-14 – 2020-09-22 (×9): 1 mg via ORAL
  Filled 2020-09-13 (×9): qty 1

## 2020-09-13 MED ORDER — CITALOPRAM HYDROBROMIDE 40 MG PO TABS
40.0000 mg | ORAL_TABLET | Freq: Every day | ORAL | Status: DC
  Administered 2020-09-14 – 2020-09-22 (×9): 40 mg via ORAL
  Filled 2020-09-13 (×9): qty 1

## 2020-09-13 MED ORDER — POLYETHYLENE GLYCOL 3350 17 G PO PACK
17.0000 g | PACK | Freq: Every day | ORAL | Status: DC | PRN
  Administered 2020-09-14: 17 g via ORAL

## 2020-09-13 MED ORDER — ALPRAZOLAM 0.5 MG PO TABS
2.0000 mg | ORAL_TABLET | Freq: Two times a day (BID) | ORAL | Status: DC
  Administered 2020-09-13 – 2020-09-22 (×18): 2 mg via ORAL
  Filled 2020-09-13 (×11): qty 4
  Filled 2020-09-13: qty 8
  Filled 2020-09-13: qty 4
  Filled 2020-09-13: qty 8
  Filled 2020-09-13 (×4): qty 4

## 2020-09-13 MED ORDER — DOCUSATE SODIUM 50 MG/5ML PO LIQD: 100.0000 mg | Freq: Two times a day (BID) | ORAL | Status: DC

## 2020-09-13 MED ORDER — POLYETHYLENE GLYCOL 3350 17 G PO PACK
17.0000 g | PACK | Freq: Every day | ORAL | Status: DC
  Filled 2020-09-13: qty 1

## 2020-09-13 MED ORDER — GABAPENTIN 400 MG PO CAPS
1200.0000 mg | ORAL_CAPSULE | Freq: Three times a day (TID) | ORAL | Status: DC
  Administered 2020-09-13 – 2020-09-22 (×27): 1200 mg via ORAL
  Filled 2020-09-13: qty 4
  Filled 2020-09-13: qty 3
  Filled 2020-09-13: qty 4
  Filled 2020-09-13 (×5): qty 3
  Filled 2020-09-13 (×2): qty 4
  Filled 2020-09-13: qty 3
  Filled 2020-09-13: qty 4
  Filled 2020-09-13: qty 3
  Filled 2020-09-13 (×2): qty 4
  Filled 2020-09-13 (×3): qty 3
  Filled 2020-09-13: qty 4
  Filled 2020-09-13 (×2): qty 3
  Filled 2020-09-13 (×4): qty 4
  Filled 2020-09-13: qty 3
  Filled 2020-09-13: qty 4

## 2020-09-13 MED ORDER — ADULT MULTIVITAMIN W/MINERALS CH
1.0000 | ORAL_TABLET | Freq: Every day | ORAL | Status: DC
  Administered 2020-09-14 – 2020-09-22 (×9): 1 via ORAL
  Filled 2020-09-13 (×9): qty 1

## 2020-09-13 MED ORDER — FUROSEMIDE 10 MG/ML IJ SOLN
40.0000 mg | Freq: Once | INTRAMUSCULAR | Status: AC
  Administered 2020-09-13: 40 mg via INTRAVENOUS
  Filled 2020-09-13: qty 4

## 2020-09-13 MED ORDER — DEXMEDETOMIDINE HCL IN NACL 400 MCG/100ML IV SOLN
0.0000 ug/kg/h | INTRAVENOUS | Status: DC
  Administered 2020-09-13: 0.4 ug/kg/h via INTRAVENOUS
  Administered 2020-09-13: 1 ug/kg/h via INTRAVENOUS
  Filled 2020-09-13 (×2): qty 100

## 2020-09-13 MED ORDER — PANTOPRAZOLE SODIUM 40 MG PO PACK
40.0000 mg | PACK | Freq: Every day | ORAL | Status: DC
  Administered 2020-09-13 – 2020-09-16 (×4): 40 mg via ORAL
  Filled 2020-09-13 (×4): qty 20

## 2020-09-13 MED ORDER — QUETIAPINE FUMARATE 100 MG PO TABS
150.0000 mg | ORAL_TABLET | Freq: Every day | ORAL | Status: DC
  Administered 2020-09-13 – 2020-09-21 (×9): 150 mg via ORAL
  Filled 2020-09-13 (×9): qty 1

## 2020-09-13 MED ORDER — ENOXAPARIN SODIUM 100 MG/ML ~~LOC~~ SOLN
1.0000 mg/kg | Freq: Two times a day (BID) | SUBCUTANEOUS | Status: DC
  Administered 2020-09-13 – 2020-09-16 (×7): 97.5 mg via SUBCUTANEOUS
  Filled 2020-09-13 (×8): qty 0.97

## 2020-09-13 MED ORDER — THIAMINE HCL 100 MG PO TABS
100.0000 mg | ORAL_TABLET | Freq: Every day | ORAL | Status: DC
Start: 2020-09-14 — End: 2020-09-22
  Administered 2020-09-14 – 2020-09-22 (×9): 100 mg via ORAL
  Filled 2020-09-13 (×9): qty 1

## 2020-09-13 MED ORDER — METOPROLOL TARTRATE 25 MG PO TABS
25.0000 mg | ORAL_TABLET | Freq: Two times a day (BID) | ORAL | Status: DC
  Administered 2020-09-13 – 2020-09-15 (×6): 25 mg via ORAL
  Filled 2020-09-13 (×6): qty 1

## 2020-09-13 MED ORDER — ARIPIPRAZOLE 5 MG PO TABS
5.0000 mg | ORAL_TABLET | Freq: Every day | ORAL | Status: DC
  Administered 2020-09-13 – 2020-09-22 (×10): 5 mg via ORAL
  Filled 2020-09-13 (×10): qty 1

## 2020-09-13 MED ORDER — DOCUSATE SODIUM 50 MG/5ML PO LIQD
100.0000 mg | Freq: Two times a day (BID) | ORAL | Status: DC | PRN
  Administered 2020-09-14: 100 mg via ORAL
  Filled 2020-09-13: qty 10

## 2020-09-13 MED ORDER — ACETAMINOPHEN 325 MG PO TABS
650.0000 mg | ORAL_TABLET | Freq: Four times a day (QID) | ORAL | Status: DC | PRN
  Administered 2020-09-14 – 2020-09-22 (×13): 650 mg via ORAL
  Filled 2020-09-13 (×13): qty 2

## 2020-09-13 MED ORDER — POLYETHYLENE GLYCOL 3350 17 G PO PACK: 17.0000 g | PACK | Freq: Every day | ORAL | Status: DC

## 2020-09-13 MED ORDER — PANTOPRAZOLE SODIUM 40 MG PO TBEC
40.0000 mg | DELAYED_RELEASE_TABLET | Freq: Every day | ORAL | Status: DC
  Filled 2020-09-13: qty 1

## 2020-09-13 NOTE — Progress Notes (Signed)
  Speech Language Pathology Treatment: Dysphagia  Patient Details Name: Karen Casey MRN: [id number] DOB: 03-12-1956 Today's Date: 09/13/2020 Time: 4656-8127 SLP Time Calculation (min) (ACUTE ONLY): 19 min  Assessment / Plan / Recommendation Clinical Impression  Pt seen for swallow re-assessment with improved swallow function noted, but pt continues to be SOB with phrases only completed during simple conversation prior to requiring breath and post-swallow exhalation minimal during intake.  Pt noted to be min impulsive and educated re: swallowing/breathing reciprocity and impact of utilizing compensatory strategies to reduce/eliminate risk of aspiration during intake.  Pt's oxygen saturation remained at 93-95 throughout intake with low intensity vocal quality noted overall, but no s/s of overt aspiration observed throughout trial.  Mastication impacted d/t right lingual edema/pain (odynophagia) with small amount of soft solids.  Fatigue may also play a role in diet consumption safety, but this will be f/u by ST in future sessions while in acute setting.  Recommend initiating a Dysphagia 1/thin liquid diet with swallowing precautions utilized including: slow rate and small bites/sips followed throughout to minimize aspiration risk d/t SOB/decreased respiratory status. If pt exhibits any s/s of aspiration with intake, POs should be discontinued.    HPI HPI: Pt is a 65 year old female with PMHx significant for depression/anxiety (chronic Xanax use with history of seizure 2/2 withdrawal 2013), EtOH abuse (drinks estimated 6-10 beers/day) and tobacco abuse who presented to [facility] ED on 3/29 after being found altered at home. On admission pt's family reported periods of cyclical vomiting that last up to a week. Pt had witnessed seizure in CT (non-focal shaking movements, 3-5 minutes long per CT staff). Pt received a total of 4mg  Ativan and, upon return to ED was minimally responsive (likely post-ictal) and not  protecting her airway. ETT 3/29-4/1 [phone]). CXR 3/31: Progressive left basilar consolidation. Progressive pulmonary hypoinflation; BSE completed on 09/12/20 with PO readiness goals initiated.      SLP Plan  Goals updated       Recommendations  Diet recommendations: Dysphagia 1 (puree);Thin liquid Liquids provided via: Cup;No straw Medication Administration: Whole meds with puree Supervision: Patient able to self feed;Staff to assist with self feeding;Full supervision/cueing for compensatory strategies Compensations: Slow rate;Small sips/bites Postural Changes and/or Swallow Maneuvers: Seated upright 90 degrees                Oral Care Recommendations: Oral care BID Follow up Recommendations:  (TBD) SLP Visit Diagnosis: Dysphagia, unspecified (R13.10) Plan: Goals updated                       Elvina Sidle, M.S., CCC-SLP 09/13/2020, 12:54 PM

## 2020-09-13 NOTE — Evaluation (Signed)
Physical Therapy Evaluation Patient Details Name: Karen Casey MRN: [id number] DOB: 09/28/1955 Today's Date: 09/13/2020   History of Present Illness  65 yo admitted 3/29 after found with AMS and covered in feces at home. Pt was taken for head CT during which she had a Sz and required intubation 3/29. Extubated 4/1. PMhx: ETOH abuse, depression/anxiety, GERD, IBS, HTN, urinary incontinence  Clinical Impression  Pt pleasant and reports back pain at rest and with activity. Pt noting cut on her tongue as painful but unaware she had a Sz. Pt lives with long term boyfriend and reports he can assist at home. Pt is a retired Administrator, arts and reports she is normally independent in caring for herself. Pt with decreased transfers, activity tolerance and function unable to progress beyond basic transfers this session and will benefit from acute therapy to maximize mobility, safety and function to return home.   Pt on 2L O2 during session with SpO2 94% and drop to 86% with transfers and recovers within 10 sec at rest.  HR 103-116    Follow Up Recommendations Home health PT;Supervision for mobility/OOB    Equipment Recommendations  Rolling walker with 5" wheels;3in1 (PT)    Recommendations for Other Services       Precautions / Restrictions Precautions Precautions: Fall Precaution Comments: watch sats      Mobility  Bed Mobility Overal bed mobility: Needs Assistance Bed Mobility: Supine to Sit     Supine to sit: Supervision     General bed mobility comments: supervision for lines with use of rail and HOB 25 degrees to pivot to right side of bed    Transfers Overall transfer level: Needs assistance   Transfers: Sit to/from Stand;Stand Pivot Transfers Sit to Stand: Min guard Stand pivot transfers: Min assist       General transfer comment: cues for hand placement and guarding to rise from surface. Pt with use of RW to pivot from bed to chair. On 2nd stand to attempt gait  pt reports fatigue and dizziness with SpO2 drop to 86% on 2L and unable to progress further  Ambulation/Gait             General Gait Details: unable  Stairs            Wheelchair Mobility    Modified Rankin (Stroke Patients Only)       Balance Overall balance assessment: History of Falls;Needs assistance   Sitting balance-Leahy Scale: Fair     Standing balance support: Bilateral upper extremity supported Standing balance-Leahy Scale: Poor                               Pertinent Vitals/Pain Faces Pain Scale: Hurts little more Pain Location: lower back Pain Descriptors / Indicators: Aching;Guarding;Constant Pain Intervention(s): Limited activity within patient's tolerance;Monitored during session;Repositioned    Home Living Family/patient expects to be discharged to:: Private residence Living Arrangements: Spouse/significant other Available Help at Discharge: Family;Available 24 hours/day Type of Home: Apartment Home Access: Level entry     Home Layout: One level Home Equipment: None      Prior Function Level of Independence: Independent               Hand Dominance        Extremity/Trunk Assessment   Upper Extremity Assessment Upper Extremity Assessment: Generalized weakness    Lower Extremity Assessment Lower Extremity Assessment: Generalized weakness    Cervical / Trunk Assessment  Cervical / Trunk Assessment: Kyphotic  Communication   Communication: Expressive difficulties (hoarse post extubation)  Cognition Arousal/Alertness: Awake/alert Behavior During Therapy: WFL for tasks assessed/performed Overall Cognitive Status: Within Functional Limits for tasks assessed                                        General Comments      Exercises General Exercises - Lower Extremity Long Arc Quad: AROM;Both;Seated;10 reps Hip Flexion/Marching: AROM;Both;Seated;10 reps   Assessment/Plan    PT Assessment  Patient needs continued PT services  PT Problem List Decreased strength;Decreased mobility;Decreased activity tolerance;Decreased balance;Decreased knowledge of use of DME;Cardiopulmonary status limiting activity       PT Treatment Interventions Gait training;Functional mobility training;Therapeutic activities;Patient/family education;Stair training;DME instruction;Therapeutic exercise;Balance training    PT Goals (Current goals can be found in the Care Plan section)  Acute Rehab PT Goals Patient Stated Goal: return home and read PT Goal Formulation: With patient Time For Goal Achievement: 09/27/20 Potential to Achieve Goals: Fair    Frequency Min 3X/week   Barriers to discharge        Co-evaluation               AM-PAC PT "6 Clicks" Mobility  Outcome Measure Help needed turning from your back to your side while in a flat bed without using bedrails?: A Little Help needed moving from lying on your back to sitting on the side of a flat bed without using bedrails?: A Little Help needed moving to and from a bed to a chair (including a wheelchair)?: A Little Help needed standing up from a chair using your arms (e.g., wheelchair or bedside chair)?: A Little Help needed to walk in hospital room?: Total Help needed climbing 3-5 steps with a railing? : Total 6 Click Score: 14    End of Session Equipment Utilized During Treatment: Gait belt;Oxygen Activity Tolerance: Patient tolerated treatment well Patient left: in chair;with call bell/phone within reach;with chair alarm set Nurse Communication: Mobility status PT Visit Diagnosis: Other abnormalities of gait and mobility (R26.89);Difficulty in walking, not elsewhere classified (R26.2)    Time: 9371-6967 PT Time Calculation (min) (ACUTE ONLY): 25 min   Charges:   PT Evaluation $PT Eval Moderate Complexity: 1 Mod PT Treatments $Therapeutic Activity: 8-22 mins        Jana Swartzlander P, PT Acute Rehabilitation Services Pager:  [phone] Office: [phone]   Sandy Salaam Sheylin Scharnhorst 09/13/2020, 9:27 AM

## 2020-09-13 NOTE — Plan of Care (Signed)
Brief Neuro Update:  Please read Dr. Ellan Lambert note from 09/11/20 for full recs.  Discussed with team. She is extubated and awake, alert and following all commands. She was doing all of that on 09/11/20 too. Continue CIWA protocol. Will need slowed prolonged Xanax taper and discussed no EtOH intake with patient and family. If she goes back to drinking EtOH, recommend that she not try to quit at home and instead come to the hospital for managed withdrawal given seizures.  I do not think she needs to be on any antiseizure medications right now given her seizure occurred in the setting of likely EtOH withdrawal.  Neurology inpatient team will signoff. Please feel free to contact us with any questions or concerns.  [place] Pager Number [phone]

## 2020-09-13 NOTE — Progress Notes (Signed)
[facility] Progress Note Patient Name: Karen Casey DOB: 01-23-1956 MRN: [id number]   Date of Service  09/13/2020  HPI/Events of Note  Patient with chronic back pain for which she usually takes Aleve  eICU Interventions  Tylenol prn ordered        Judd Lien 09/13/2020, 5:13 AM

## 2020-09-13 NOTE — Progress Notes (Signed)
ANTICOAGULATION CONSULT NOTE - Initial Consult  Pharmacy Consult for Lovenox Indication: atrial fibrillation  No Known Allergies  Patient Measurements: Height: 5\' 6"  (167.6 cm) Weight: 97.9 kg (215 lb 13.3 oz) IBW/kg (Calculated) : 59.3  Vital Signs: Temp: 97.7 F (36.5 C) (04/02 0700) Temp Source: Oral (04/02 0700) BP: 108/70 (04/02 0700) Pulse Rate: 103 (04/02 0824)  Labs: Recent Labs    09/12/20 0116 09/12/20 0811 09/12/20 1855 09/13/20 0422  HGB 11.9* 11.9* 11.2* 11.6*  HCT 35.9* 35.8* 33.0* 35.4*  PLT 135* 142*  --  142*  CREATININE 0.65 0.64  --  0.62    Estimated Creatinine Clearance: 86 mL/min (by C-G formula based on SCr of 0.62 mg/dL).   Medical History: Past Medical History:  Diagnosis Date  . Alcohol abuse   . Allergy   . Anxiety   . Cataract 06/09/2012   Right eye and left eye  . Depression   . GERD (gastroesophageal reflux disease)   . 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 Seizures ([place])    xanax withdrawl- December 2013  . Urinary incontinence 06/09/2012   Assessment: 65 yo presented to [facility] ED via EMS for AMS at home. History of EtOH abuse and benzo use (Xanax, prescribed by psychiatrist). Per patient's sister, drinks 6-10 beers/day at baseline; has been unable to tolerate PO for one week prior to admission. Witnessed seizure x 3-5 minutes 3/29PM.  Patient developed A fib overnight. Pharmacy consulted to start Lovenox dosing for atrial fibrillation.   Goal of Therapy:  Anti-Xa level 0.6-1 units/ml 4hrs after LMWH dose given Monitor platelets by anticoagulation protocol: Yes   Plan:  Lovenox 97.5 mg subq bid Monitor for signs and symptoms of bleeding and monitor platelets.  Alanda Slim, PharmD, [facility] Clinical Pharmacist Please see AMION for all Pharmacists' Contact Phone Numbers 09/13/2020, 10:33 AM

## 2020-09-13 NOTE — Progress Notes (Signed)
NAME:  Karen Casey, MRN:  [id number], DOB:  05-30-1956, LOS: 3 ADMISSION DATE:  09/09/2020, CONSULTATION DATE:  09/09/2020 REFERRING MD:  Calvert Cantor CHIEF COMPLAINT:  AMS, seizure, concern for EtOH withdrawal   History of Present Illness:  65 year old female with PMHx significant for depression/anxiety (chronic Xanax use with history of seizure 2/2 withdrawal 2013), EtOH abuse (drinks estimated 6-10 beers/day) and tobacco abuse (43 pack-years) who presented to [facility] ED via EMS 3/29 after being found altered at home.   Per patient's sister (present at bedside), patient frequently has periods of cyclical vomiting that last up to a week; patient was currently about 7 days into a period of vomiting where she was unable to keep down PO. Sister reports that when patient was unable to tolerate PO/EtOH, she would get upset/anxious and take additional Xanax. Patient's significant other (who lives in the home) reported that patient did not seem well and patient's daughter drove to check on the patient. On daughter's arrival, patient appeared to be altered and covered in feces with decreased responsiveness. EMS was contacted and on arrival, patient became agitated and combative, fighting EMS personnel. Patient received Versed 4mg  total en route to [facility] ED.  On arrival to [facility] ED, patient was still slightly agitated but calm enough to answer some questions for ED staff. Given AMS, she was taken for CT Head during which she experienced a seizure (witnessed, non-focal shaking movements, 3-5 minutes long per CT staff). Received a total of 4mg  Ativan. Patient was moved back to the ED minimally responsive (likely post-ictal) and not protecting her airway and was subsequently intubated by ED staff. At the time of intubation, patient was noted to have partially dried blood-tinged secretions around her mouth and a small tongue laceration was noted (likely sustained during seizure). Neurology was consulted for further  evaluation of seizure activity with recommendation for LTM. PCCM was consulted for ICU admission for AMS, seizure/likely EtOH withdrawal and ventilator management.  Pertinent  Medical History   Past Medical History:  Diagnosis Date  . Alcohol abuse   . Allergy   . Anxiety   . Cataract 06/09/2012   Right eye and left eye  . Depression   . GERD (gastroesophageal reflux disease)   . 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 Seizures ([place])    xanax withdrawl- December 2013  . Urinary incontinence 06/09/2012   Significant Hospital Events: Including procedures, antibiotic start and stop dates in addition to other pertinent events   . 3/29 - Presented to [facility] ED via EMS for AMS. C/f EtOH/benzo withdrawal. Seizure x 3-5 minutes with generalized "shaking" in CT scanner, tongue lac. Post-ictal, unable to protect airway and intubated. Neuro consulted for seizure, PCCM consult for admission for EtOH withdrawal. CIWA, LTM initiated. . 3/30 - EEG neg seizure. Unable to extubate due to fatigue, diaphoresis, emesis. Marland Kitchen ETT 3/29 >> 4/1 , hoarse voice, mild pseudowheeze  Interim History / Subjective:   Off pressors. Developed mild pseudowheezing improved with nebs. Denies chest pain, complains of chronic back pain Low adequate urine output last 24 hours, 10 L positive   Objective   Blood pressure 108/70, pulse (!) 103, temperature 97.7 F (36.5 C), temperature source Oral, resp. rate (!) 25, height 5\' 6"  (1.676 m), weight 97.9 kg, SpO2 94 %.        Intake/Output Summary (Last 24 hours) at 09/13/2020 0950 Last data filed at 09/13/2020 0400 Gross per 24 hour  Intake 212.67 ml  Output 651 ml  Net -438.33 ml   Filed Weights   09/10/20 0245 09/11/20 0452 09/13/20 0500  Weight: 88.9 kg 94.9 kg 97.9 kg   Physical Exam: General: Chronically ill-appearing, no acute distress HENT: [place], AT, hoarse voice Eyes: EOMI, no scleral icterus Respiratory: Faint expiratory rhonchi  bilateral Cardiovascular: RRR, -M/R/G, no JVD GI: BS+, soft, nontender Extremities:-Edema,-tenderness Neuro: Alert, interactive, nonfocal Clear urine  Chest x-ray 3/31 independently reviewed which shows bilateral stable multifocal opacities Labs show mild hyponatremia, no leukocytosis  Labs/imaging that I have personally reviewed: (right click and "Reselect all SmartList Selections" daily)   resp cx 3/30 >> ng CT Head 3/30 - Normal  Resolved Hospital Problem list   AKI Acute metabolic encephalopathy  Septic shock versus Sedation related hypotension Assessment & Plan:   Seizure likely secondary to EtOH/benzo withdrawal Presented to [facility] ED via EMS for AMS at home. History of EtOH abuse and benzo use (Xanax, prescribed by psychiatrist). Of note, patient has a history of seizure 2/2 benzo withdrawal (2013). Per patient's sister, drinks 6-10 beers/day at baseline; unable to tolerate PO for one week prior to admission and had taken additional Xanax/run out. Witnessed seizure in CT scanner x 3-5 minutes 3/29PM with subsequent post-ictal state requiring intubation.  - No role for AEDs at this time, per Neuro  - CIWA protocol + thiamine/folate - ICU  monitoring, back on Precedex, can taper to off -- Neuro recommendations - 'Continue Xanax 2mg  BID for now(home dose) along with CIWA protocol. Once out of withdrawal window, plan for slow prolonged outpatient Xanax taper over a couple months. Discussed no driving, discussed stop drinking EtOH. If she does go back to drinking EtOH, recommend that she not try to quit at home and instead come to the hospital for managed withdrawal given seizure. '  Acute hypercapnic and hypoxemic respiratory failure in the setting of seizure and AMS Intubated after seizure 3/29, given persistent post-ictal state/AMS and inability to protect airway.  -Extubated 4/1 Diurese with Lasix x1  Aspiration with emesis  - Continue Rocephin D4/7  Atrial fibrillation  -incited by sepsis, electrolytes and dehydration Elevated troponin -Had reverted to normal sinus rhythm but back in RVR 4/1 - Echo wnl -CHA2DS2-VASc score of 1 -will start Lovenox - Correct electrolytes for K >4 and Mg >2  Hypovolemic hyponatremia - improving Persistent nausea/vomiting x 1 week PTA, per patient's sister. Inability to tolerate PO. Hgb 17, patient likely intravascularly dry/volume down. -Sodium improving as expected  Hypokalemia Hypomagnesemia - Replete K   Anxiety -Resume home celexa, gabapentin and seroquel -Resume home Xanax 2 twice daily -Resume cymbalta and abilify  Best practice (right click and "Reselect all SmartList Selections" daily)  Diet:  Tube Feed  Pain/Anxiety/Delirium protocol (if indicated): Y, RASS 0 VAP protocol (if indicated): Yes DVT prophylaxis: lovenox GI prophylaxis: PPI Glucose control:  SSI Yes Central venous access:  N/A Arterial line:  N/A Foley:  Yes, and it is still needed Mobility:  PT PT consulted: Yes Last date of multidisciplinary goals of care discussion [3/29] Code Status:  full code Disposition: ICU  Sister and daughter updated at bedside  Labs   CBC: Recent Labs  Lab 09/09/20 2147 09/10/20 0028 09/10/20 0303 09/11/20 0447 09/12/20 0116 09/12/20 0811 09/12/20 1855 09/13/20 0422  WBC 20.9*  --  19.2* 17.4* 10.5 9.8  --  7.9  NEUTROABS 19.0*  --   --   --   --   --   --   --   HGB  17.7*   < > 17.0* 13.2 11.9* 11.9* 11.2* 11.6*  HCT 48.7*   < > 47.5* 39.1 35.9* 35.8* 33.0* 35.4*  MCV 91.2  --  91.9 97.0 100.6* 99.7  --  102.9*  PLT 209  --  199 165 135* 142*  --  142*   < > = values in this interval not displayed.   Basic Metabolic Panel: Recent Labs  Lab 09/10/20 0303 09/10/20 0529 09/10/20 8338 09/10/20 1223 09/11/20 0447 09/12/20 0116 09/12/20 0811 09/12/20 1855 09/13/20 0422  NA 120*   < > 123*   < > 128* 131* 131* 132* 131*  K 2.9*  --  3.8  --  3.6 3.5 4.4 4.8 4.4  CL 82*  --  92*  --  101  102 101  --  99  CO2 28  --  21*  --  21* 24 25  --  25  GLUCOSE 116*  --  120*  --  112* 149* 129*  --  99  BUN 6*  --  7*  --  13 14 14   --  9  CREATININE 1.08*  --  1.05*  --  0.70 0.65 0.64  --  0.62  CALCIUM 9.3  --  8.2*  --  7.9* 8.3* 8.4*  --  8.5*  MG 1.5*  --   --   --  2.2 1.8 2.2  --  1.8  PHOS 3.8  --   --   --  2.9 2.6 2.9  --  3.1   < > = values in this interval not displayed.   GFR: Estimated Creatinine Clearance: 86 mL/min (by C-G formula based on SCr of 0.62 mg/dL). Recent Labs  Lab 09/09/20 2147 09/10/20 0303 09/10/20 0309 09/11/20 0447 09/12/20 0116 09/12/20 0811 09/13/20 0422  WBC 20.9*   < >  --  17.4* 10.5 9.8 7.9  LATICACIDVEN 2.3*  --  1.7  --   --   --   --    < > = values in this interval not displayed.   Liver Function Tests: Recent Labs  Lab 09/09/20 2147 09/10/20 0303  AST 46* 45*  ALT 34 32  ALKPHOS 66 68  BILITOT 1.7* 1.6*  PROT 7.0 7.0  ALBUMIN 4.0 3.8   No results for input(s): LIPASE, AMYLASE in the last 168 hours. Recent Labs  Lab 09/09/20 2147  AMMONIA 30   ABG:    Component Value Date/Time   PHART 7.344 (L) 09/12/2020 1855   PCO2ART 48.0 09/12/2020 1855   PO2ART 82 (L) 09/12/2020 1855   HCO3 26.2 09/12/2020 1855   TCO2 28 09/12/2020 1855   O2SAT 95.0 09/12/2020 1855    Coagulation Profile: Recent Labs  Lab 09/09/20 2147  INR 1.2   Cardiac Enzymes: No results for input(s): CKTOTAL, CKMB, CKMBINDEX, TROPONINI in the last 168 hours.  HbA1C: Hemoglobin A1C  Date/Time Value Ref Range Status  02/13/2013 10:13 AM 5.2  Final   Hgb A1c MFr Bld  Date/Time Value Ref Range Status  06/20/2020 02:31 PM 5.3 4.8 - 5.6 % Final    Comment:             Prediabetes: 5.7 - 6.4          Diabetes: >6.4          Glycemic control for adults with diabetes: <7.0    CBG: Recent Labs  Lab 09/12/20 1511 09/12/20 1904 09/12/20 2347 09/13/20 0306 09/13/20 0709  GLUCAP 97 112* 104* 95  82    Critical care time: 33minutes    Landynn Dupler V. Elsworth Soho, MD [facility] 09/13/20 9:50 AM

## 2020-09-14 DIAGNOSIS — R569 Unspecified convulsions: Secondary | ICD-10-CM | POA: Diagnosis not present

## 2020-09-14 DIAGNOSIS — F10231 Alcohol dependence with withdrawal delirium: Secondary | ICD-10-CM | POA: Diagnosis not present

## 2020-09-14 DIAGNOSIS — J9601 Acute respiratory failure with hypoxia: Secondary | ICD-10-CM | POA: Diagnosis not present

## 2020-09-14 LAB — GLUCOSE, CAPILLARY
Glucose-Capillary: 110 mg/dL — ABNORMAL HIGH (ref 70–99)
Glucose-Capillary: 120 mg/dL — ABNORMAL HIGH (ref 70–99)
Glucose-Capillary: 121 mg/dL — ABNORMAL HIGH (ref 70–99)
Glucose-Capillary: 91 mg/dL (ref 70–99)
Glucose-Capillary: 96 mg/dL (ref 70–99)

## 2020-09-14 LAB — BASIC METABOLIC PANEL
Anion gap: 7 (ref 5–15)
BUN: 6 mg/dL — ABNORMAL LOW (ref 8–23)
CO2: 32 mmol/L (ref 22–32)
Calcium: 8.9 mg/dL (ref 8.9–10.3)
Chloride: 97 mmol/L — ABNORMAL LOW (ref 98–111)
Creatinine, Ser: 0.52 mg/dL (ref 0.44–1.00)
GFR, Estimated: >60 mL/min (ref >60)
Glucose, Bld: 113 mg/dL — ABNORMAL HIGH (ref 70–99)
Potassium: 4.2 mmol/L (ref 3.5–5.1)
Sodium: 136 mmol/L (ref 135–145)

## 2020-09-14 LAB — MAGNESIUM: Magnesium: 1.7 mg/dL (ref 1.7–2.4)

## 2020-09-14 LAB — CBC
HCT: 36.6 % (ref 36.0–46.0)
Hemoglobin: 11.7 g/dL — ABNORMAL LOW (ref 12.0–15.0)
MCH: 32.7 pg (ref 26.0–34.0)
MCHC: 32 g/dL (ref 30.0–36.0)
MCV: 102.2 fL — ABNORMAL HIGH (ref 80.0–100.0)
Platelets: 155 10*3/uL (ref 150–400)
RBC: 3.58 MIL/uL — ABNORMAL LOW (ref 3.87–5.11)
RDW: 13 % (ref 11.5–15.5)
WBC: 7.6 10*3/uL (ref 4.0–10.5)
nRBC: 0 % (ref 0.0–0.2)

## 2020-09-14 LAB — PHOSPHORUS: Phosphorus: 3.2 mg/dL (ref 2.5–4.6)

## 2020-09-14 MED ORDER — AMLODIPINE BESYLATE 10 MG PO TABS
10.0000 mg | ORAL_TABLET | Freq: Every day | ORAL | Status: DC
  Administered 2020-09-14 – 2020-09-15 (×2): 10 mg via ORAL
  Filled 2020-09-14 (×2): qty 1

## 2020-09-14 MED ORDER — IPRATROPIUM-ALBUTEROL 0.5-2.5 (3) MG/3ML IN SOLN
3.0000 mL | Freq: Three times a day (TID) | RESPIRATORY_TRACT | Status: DC
  Administered 2020-09-14 – 2020-09-20 (×17): 3 mL via RESPIRATORY_TRACT
  Filled 2020-09-14 (×18): qty 3

## 2020-09-14 MED ORDER — DEXTROSE-NACL 5-0.9 % IV SOLN: INTRAVENOUS | Status: DC

## 2020-09-14 NOTE — Progress Notes (Signed)
NAME:  Karen Casey, MRN:  [id number], DOB:  Oct 06, 1955, LOS: 4 ADMISSION DATE:  09/09/2020, CONSULTATION DATE:  09/09/2020 REFERRING MD:  Calvert Cantor CHIEF COMPLAINT:  AMS, seizure, concern for EtOH withdrawal   History of Present Illness:  65 year old female with PMHx significant for depression/anxiety (chronic Xanax use with history of seizure 2/2 withdrawal 2013), EtOH abuse (drinks estimated 6-10 beers/day) and tobacco abuse (43 pack-years) who presented to [facility] ED via EMS 3/29 after being found altered at home.   Per patient's sister (present at bedside), patient frequently has periods of cyclical vomiting that last up to a week; patient was currently about 7 days into a period of vomiting where she was unable to keep down PO. Sister reports that when patient was unable to tolerate PO/EtOH, she would get upset/anxious and take additional Xanax. Patient's significant other (who lives in the home) reported that patient did not seem well and patient's daughter drove to check on the patient. On daughter's arrival, patient appeared to be altered and covered in feces with decreased responsiveness. EMS was contacted and on arrival, patient became agitated and combative, fighting EMS personnel. Patient received Versed 4mg  total en route to [facility] ED.  On arrival to [facility] ED, patient was still slightly agitated but calm enough to answer some questions for ED staff. Given AMS, she was taken for CT Head during which she experienced a seizure (witnessed, non-focal shaking movements, 3-5 minutes long per CT staff). Received a total of 4mg  Ativan. Patient was moved back to the ED minimally responsive (likely post-ictal) and not protecting her airway and was subsequently intubated by ED staff. At the time of intubation, patient was noted to have partially dried blood-tinged secretions around her mouth and a small tongue laceration was noted (likely sustained during seizure). Neurology was consulted for further  evaluation of seizure activity with recommendation for LTM. PCCM was consulted for ICU admission for AMS, seizure/likely EtOH withdrawal and ventilator management.  Pertinent  Medical History   Past Medical History:  Diagnosis Date  . Alcohol abuse   . Allergy   . Anxiety   . Cataract 06/09/2012   Right eye and left eye  . Depression   . GERD (gastroesophageal reflux disease)   . 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 Seizures ([place])    xanax withdrawl- December 2013  . Urinary incontinence 06/09/2012   Significant Hospital Events: Including procedures, antibiotic start and stop dates in addition to other pertinent events   . 3/29 - Presented to [facility] ED via EMS for AMS. C/f EtOH/benzo withdrawal. Seizure x 3-5 minutes with generalized "shaking" in CT scanner, tongue lac. Post-ictal, unable to protect airway and intubated. Neuro consulted for seizure, PCCM consult for admission for EtOH withdrawal. CIWA, LTM initiated. . 3/30 - EEG neg seizure. Unable to extubate due to fatigue, diaphoresis, emesis. Marland Kitchen ETT 3/29 >> 4/1 , hoarse voice, mild pseudowheeze . Precedex drip weaned off  Interim History / Subjective:   Precedex drip weaned to off 4/2.  Hemodynamically stable Afebrile On 2 L nasal cannula, appears weak and deconditioned Diuresed 1.5 L with Lasix  Objective   Blood pressure (!) 159/87, pulse (!) 111, temperature 98.8 F (37.1 C), temperature source Oral, resp. rate (!) 25, height 5\' 6"  (1.676 m), weight 97.9 kg, SpO2 93 %.        Intake/Output Summary (Last 24 hours) at 09/14/2020 1302 Last data filed at 09/14/2020 0600 Gross per 24 hour  Intake  473.07 ml  Output 700 ml  Net -226.93 ml   Filed Weights   09/11/20 0452 09/13/20 0500 09/14/20 0500  Weight: 94.9 kg 97.9 kg 97.9 kg   Physical Exam: General: Chronically ill-appearing, no acute distress HENT: [place], AT, hoarse voice Eyes: EOMI, no scleral icterus Respiratory: Weak cough, no accessory  muscle use, faint scattered rhonchi Cardiovascular: RRR, -M/R/G, no JVD GI: BS+, soft, nontender Extremities:-Edema,-tenderness Neuro: Alert, interactive, nonfocal, no asterixis   Chest x-ray 3/31 independently reviewed which shows bilateral stable multifocal opacities Labs show resolved hyponatremia, no leukocytosis, stable anemia  Labs/imaging that I have personally reviewed: (right click and "Reselect all SmartList Selections" daily)   resp cx 3/30 >> ng CT Head 3/30 - Normal  Resolved Hospital Problem list   AKI Acute metabolic encephalopathy  Septic shock versus Sedation related hypotension Acute hypercapnic and hypoxemic respiratory failure in the setting of seizure and AMS -Extubated 4/1 Hypovolemic hyponatremia -adm with Hgb 17  Assessment & Plan:   Seizure likely secondary to EtOH/benzo withdrawal Presented to [facility] ED via EMS for AMS at home. History of EtOH abuse and benzo use (Xanax, prescribed by psychiatrist). Of note, patient has a history of seizure 2/2 benzo withdrawal (2013). Per patient's sister, drinks 6-10 beers/day at baseline; unable to tolerate PO for one week prior to admission and had taken additional Xanax/run out. Witnessed seizure in CT scanner x 3-5 minutes 3/29PM with subsequent post-ictal state requiring intubation.  - No role for AEDs at this time, per Neuro  - CIWA protocol + thiamine/folate -She is now off Precedex, use Ativan as needed as needed -- Neuro recommendations - 'Continue Xanax 2mg  BID for now(home dose) along with CIWA protocol. Once out of withdrawal window, plan for slow prolonged outpatient Xanax taper over a couple months. Discussed no driving. If she does go back to drinking EtOH, recommend that she not try to quit at home and instead come to the hospital for managed withdrawal given seizure. '   Aspiration with emesis  - Continue Rocephin D5/7  Atrial fibrillation -incited by sepsis, electrolytes and dehydration - Echo wnl -Had  reverted to normal sinus rhythm but back in RVR 4/1  -CHA2DS2-VASc score of 1 -started Lovenox -Low-dose Lopressor for rate control - Correct electrolytes for K >4 and Mg >2  Anxiety -Resume home celexa, gabapentin and seroquel -Resume home Xanax 2 twice daily -Resume cymbalta and abilify  Best practice (right click and "Reselect all SmartList Selections" daily)  Diet:  Tube Feed  Pain/Anxiety/Delirium protocol (if indicated): Y, RASS 0 VAP protocol (if indicated): Yes DVT prophylaxis: lovenox GI prophylaxis: PPI Glucose control:  SSI Yes Central venous access:  N/A Arterial line:  N/A Foley:  Yes, and it is still needed Mobility:  PT PT consulted: Yes Last date of multidisciplinary goals of care discussion [3/29] Code Status:  full code Disposition: progressive , she has a weak cough so we will keep in ICU today and once she gets a little stronger again moved to progressive care in 24 hours    Labs   CBC: Recent Labs  Lab 09/09/20 2147 09/10/20 0028 09/11/20 0447 09/12/20 0116 09/12/20 0811 09/12/20 1855 09/13/20 0422 09/14/20 0333  WBC 20.9*   < > 17.4* 10.5 9.8  --  7.9 7.6  NEUTROABS 19.0*  --   --   --   --   --   --   --   HGB 17.7*   < > 13.2 11.9* 11.9* 11.2* 11.6* 11.7*  HCT  48.7*   < > 39.1 35.9* 35.8* 33.0* 35.4* 36.6  MCV 91.2   < > 97.0 100.6* 99.7  --  102.9* 102.2*  PLT 209   < > 165 135* 142*  --  142* 155   < > = values in this interval not displayed.   Basic Metabolic Panel: Recent Labs  Lab 09/11/20 0447 09/12/20 0116 09/12/20 0811 09/12/20 1855 09/13/20 0422 09/14/20 0333  NA 128* 131* 131* 132* 131* 136  K 3.6 3.5 4.4 4.8 4.4 4.2  CL 101 102 101  --  99 97*  CO2 21* 24 25  --  25 32  GLUCOSE 112* 149* 129*  --  99 113*  BUN 13 14 14   --  9 6*  CREATININE 0.70 0.65 0.64  --  0.62 0.52  CALCIUM 7.9* 8.3* 8.4*  --  8.5* 8.9  MG 2.2 1.8 2.2  --  1.8 1.7  PHOS 2.9 2.6 2.9  --  3.1 3.2   GFR: Estimated Creatinine Clearance: 86  mL/min (by C-G formula based on SCr of 0.52 mg/dL). Recent Labs  Lab 09/09/20 2147 09/10/20 0303 09/10/20 0309 09/11/20 0447 09/12/20 0116 09/12/20 0811 09/13/20 0422 09/14/20 0333  WBC 20.9*   < >  --    < > 10.5 9.8 7.9 7.6  LATICACIDVEN 2.3*  --  1.7  --   --   --   --   --    < > = values in this interval not displayed.   Liver Function Tests: Recent Labs  Lab 09/09/20 2147 09/10/20 0303  AST 46* 45*  ALT 34 32  ALKPHOS 66 68  BILITOT 1.7* 1.6*  PROT 7.0 7.0  ALBUMIN 4.0 3.8   No results for input(s): LIPASE, AMYLASE in the last 168 hours. Recent Labs  Lab 09/09/20 2147  AMMONIA 30   ABG:    Component Value Date/Time   PHART 7.344 (L) 09/12/2020 1855   PCO2ART 48.0 09/12/2020 1855   PO2ART 82 (L) 09/12/2020 1855   HCO3 26.2 09/12/2020 1855   TCO2 28 09/12/2020 1855   O2SAT 95.0 09/12/2020 1855    Coagulation Profile: Recent Labs  Lab 09/09/20 2147  INR 1.2   Cardiac Enzymes: No results for input(s): CKTOTAL, CKMB, CKMBINDEX, TROPONINI in the last 168 hours.  HbA1C: Hemoglobin A1C  Date/Time Value Ref Range Status  02/13/2013 10:13 AM 5.2  Final   Hgb A1c MFr Bld  Date/Time Value Ref Range Status  06/20/2020 02:31 PM 5.3 4.8 - 5.6 % Final    Comment:             Prediabetes: 5.7 - 6.4          Diabetes: >6.4          Glycemic control for adults with diabetes: <7.0    CBG: Recent Labs  Lab 09/13/20 1911 09/13/20 2355 09/14/20 0342 09/14/20 0710 09/14/20 1112  GLUCAP 117* 87 91 96 120*    Critical care time: 40minutes   Alaina Donati V. Elsworth Soho, MD   Kara Mead MD. FCCP. [facility] Pager : [phone]  If no response to pager , please call [phone] until 7 pm After 7:00 pm call Elink  [phone]    09/14/20 1:02 PM

## 2020-09-14 NOTE — Progress Notes (Signed)
[facility] Progress Note Patient Name: Karen Casey DOB: 1956-06-12 MRN: [id number]   Date of Service  09/14/2020  HPI/Events of Note  CBG resulted at 1, the order says to notify MD. She did not eat dinner, and is not diabetic.   eICU Interventions  Start D5 NS at 50 ml/hr for now. ETOH abuse.      Intervention Category Intermediate Interventions: Other:  Elmer Sow 09/14/2020, 1:24 AM

## 2020-09-15 ENCOUNTER — Inpatient Hospital Stay (HOSPITAL_COMMUNITY): Payer: 59

## 2020-09-15 LAB — BASIC METABOLIC PANEL
Anion gap: 7 (ref 5–15)
BUN: <5 mg/dL — ABNORMAL LOW (ref 8–23)
CO2: 36 mmol/L — ABNORMAL HIGH (ref 22–32)
Calcium: 8.7 mg/dL — ABNORMAL LOW (ref 8.9–10.3)
Chloride: 92 mmol/L — ABNORMAL LOW (ref 98–111)
Creatinine, Ser: 0.46 mg/dL (ref 0.44–1.00)
GFR, Estimated: >60 mL/min (ref >60)
Glucose, Bld: 119 mg/dL — ABNORMAL HIGH (ref 70–99)
Potassium: 4 mmol/L (ref 3.5–5.1)
Sodium: 135 mmol/L (ref 135–145)

## 2020-09-15 LAB — GLUCOSE, CAPILLARY
Glucose-Capillary: 71 mg/dL (ref 70–99)
Glucose-Capillary: 98 mg/dL (ref 70–99)
Glucose-Capillary: 138 mg/dL — ABNORMAL HIGH (ref 70–99)
Glucose-Capillary: 152 mg/dL — ABNORMAL HIGH (ref 70–99)
Glucose-Capillary: 136 mg/dL — ABNORMAL HIGH (ref 70–99)
Glucose-Capillary: 118 mg/dL — ABNORMAL HIGH (ref 70–99)
Glucose-Capillary: 150 mg/dL — ABNORMAL HIGH (ref 70–99)

## 2020-09-15 LAB — MAGNESIUM: Magnesium: 1.6 mg/dL — ABNORMAL LOW (ref 1.7–2.4)

## 2020-09-15 LAB — CBC
HCT: 37.9 % (ref 36.0–46.0)
Hemoglobin: 12.1 g/dL (ref 12.0–15.0)
MCH: 33 pg (ref 26.0–34.0)
MCHC: 31.9 g/dL (ref 30.0–36.0)
MCV: 103.3 fL — ABNORMAL HIGH (ref 80.0–100.0)
Platelets: 204 10*3/uL (ref 150–400)
RBC: 3.67 MIL/uL — ABNORMAL LOW (ref 3.87–5.11)
RDW: 13 % (ref 11.5–15.5)
WBC: 8.9 10*3/uL (ref 4.0–10.5)
nRBC: 0 % (ref 0.0–0.2)

## 2020-09-15 LAB — PHOSPHORUS: Phosphorus: 3.1 mg/dL (ref 2.5–4.6)

## 2020-09-15 LAB — BRAIN NATRIURETIC PEPTIDE: B Natriuretic Peptide: 684.3 pg/mL — ABNORMAL HIGH (ref 0.0–100.0)

## 2020-09-15 LAB — PROCALCITONIN: Procalcitonin: <0.1 ng/mL

## 2020-09-15 MED ORDER — ENSURE ENLIVE PO LIQD
237.0000 mL | Freq: Three times a day (TID) | ORAL | Status: DC
  Administered 2020-09-15 – 2020-09-20 (×9): 237 mL via ORAL

## 2020-09-15 MED ORDER — RESOURCE THICKENUP CLEAR PO POWD
ORAL | Status: DC | PRN
  Filled 2020-09-15: qty 125

## 2020-09-15 MED ORDER — MAGNESIUM SULFATE 4 GM/100ML IV SOLN
4.0000 g | Freq: Once | INTRAVENOUS | Status: AC
  Administered 2020-09-15: 4 g via INTRAVENOUS
  Filled 2020-09-15: qty 100

## 2020-09-15 MED ORDER — FUROSEMIDE 10 MG/ML IJ SOLN
20.0000 mg | Freq: Once | INTRAMUSCULAR | Status: AC
  Administered 2020-09-15: 20 mg via INTRAVENOUS
  Filled 2020-09-15: qty 2

## 2020-09-15 MED ORDER — MAGNESIUM SULFATE 2 GM/50ML IV SOLN: 2.0000 g | Freq: Once | INTRAVENOUS | Status: DC

## 2020-09-15 NOTE — Progress Notes (Signed)
  Speech Language Pathology Treatment: Dysphagia  Patient Details Name: Karen Casey MRN: [id number] DOB: May 31, 1956 Today's Date: 09/15/2020 Time: 3664-4034 SLP Time Calculation (min) (ACUTE ONLY): 15 min  Assessment / Plan / Recommendation Clinical Impression  Pt on caseload and swallow assessment re-ordered due to increased wheezing and concern for aspiration. CXR showed slightly increased bibasilar opacities question atelectasis and/or infiltrate. Pt exhibiting increased work of breathing which was not significantly increased with thin water. She seemed to have improved coordination of swallow/respiratory pattern with nectar thick. Given CXR results and clinical observation, will downgrade to nectar, continue puree and plan for MBS tomorrow.    HPI HPI: Pt is a 65 year old female with PMHx significant for depression/anxiety (chronic Xanax use with history of seizure 2/2 withdrawal 2013), EtOH abuse (drinks estimated 6-10 beers/day) and tobacco abuse who presented to [facility] ED on 3/29 after being found altered at home. On admission pt's family reported periods of cyclical vomiting that last up to a week. Pt had witnessed seizure in CT (non-focal shaking movements, 3-5 minutes long per CT staff). Pt received a total of 4mg  Ativan and, upon return to ED was minimally responsive (likely post-ictal) and not protecting her airway. ETT 3/29-4/1 [id number]). CXR 3/31: Progressive left basilar consolidation. Progressive pulmonary hypoinflation; BSE completed on 09/12/20 with PO readiness goals initiated.      SLP Plan  Continue with current plan of care;MBS       Recommendations  Diet recommendations: Nectar-thick liquid;Dysphagia 1 (puree) Liquids provided via: Cup;Straw Medication Administration: Whole meds with puree Supervision: Patient able to self feed;Staff to assist with self feeding;Full supervision/cueing for compensatory strategies Compensations: Slow rate;Small sips/bites (rest  breaks) Postural Changes and/or Swallow Maneuvers: Seated upright 90 degrees                Oral Care Recommendations: Oral care BID Follow up Recommendations:  (TBD) SLP Visit Diagnosis: Dysphagia, unspecified (R13.10) Plan: Continue with current plan of care;MBS       GO                Houston Siren 09/15/2020, 4:35 PM

## 2020-09-15 NOTE — Progress Notes (Signed)
[facility] ADULT ICU REPLACEMENT PROTOCOL   The patient does apply for the [facility] Adult ICU Electrolyte Replacment Protocol based on the criteria listed below:   1. Is GFR >/= 30 ml/min? Yes.    Patient's GFR today is >60 2. Is SCr </= 2? Yes.   Patient's SCr is 0.46 ml/kg/hr 3. Did SCr increase >/= 0.5 in 24 hours? No. 4. Abnormal electrolyte(s): Mag 1.6 5. Ordered repletion with: protocol 6. If a panic level lab has been reported, has the CCM MD in charge been notified? Yes.  .   Physician:  Dr.Stretch  Carlisle Beers 09/15/2020 5:47 AM

## 2020-09-15 NOTE — Progress Notes (Signed)
Nutrition Follow-up  DOCUMENTATION CODES:   Obesity unspecified  INTERVENTION:   - If PO intake does not improve, recommend Cortrak placement and initiation of enteral nutrition  - Ensure Enlive po TID, each supplement provides 350 kcal and 20 grams of protein  - Magic Cup TID with meals, each supplement provides 290 kcal and 9 grams of protein  NUTRITION DIAGNOSIS:   Inadequate oral intake related to lethargy/confusion,dysphagia as evidenced by meal completion < 25%.  Ongoing, being addressed via supplements  GOAL:   Patient will meet greater than or equal to 90% of their needs  Unmet at this time  MONITOR:   PO intake,Supplement acceptance,Diet advancement,Labs,Weight trends  REASON FOR ASSESSMENT:   Ventilator,Consult Enteral/tube feeding initiation and management  ASSESSMENT:   65 year old female who presented to the ED on 3/29 with AMS. PMH of EtOH abuse (approximately 6-10 beers per day), depression, anxiety, tobacco abuse. Pt admitted with seizure and respiratory failure.  4/01 - extubated 4/02 - diet advanced to dysphagia 1 with thin liquids  Discussed pt with RN and during ICU rounds. Per RN, SLP to reevaluate pt today. Pt currently on dysphagia 1 diet with thin liquids. Meal completions have been poor with 10-25% completions noted.  Spoke with pt's family member at bedside. Family member reports that pt consumed some grits, pureed pancakes, and cranberry juice this AM for breakfast.  RD will order oral nutrition supplements and monitor PO intake. If PO intake does not improve, recommend Cortrak placement and initiation of enteral nutrition.  Admit weight: 88 kg Current weight: 96.7 kg  Meal Completion: 10-25%  Medications reviewed and include: folic acid, MVI with minerals, protonix, thiamine IVF: D5 in NS @ 50 ml/hr  Labs reviewed: magnesium 1.6 CBG's: 43-138 x 24 hours  UOP: 1500 ml x 24 hours I/O's: +9.0 L since admit  Diet Order:   Diet  Order            DIET - DYS 1 Room service appropriate? Yes; Fluid consistency: Thin  Diet effective now                 EDUCATION NEEDS:   No education needs have been identified at this time  Skin:  Skin Assessment: Reviewed RN Assessment  Last BM:  09/13/20  Height:   Ht Readings from Last 1 Encounters:  09/09/20 5\' 6"  (1.676 m)    Weight:   Wt Readings from Last 1 Encounters:  09/15/20 96.7 kg    BMI:  Body mass index is 34.41 kg/m.  Estimated Nutritional Needs:   Kcal:  1700-1900  Protein:  85-100 grams  Fluid:  1.7-1.9 L    Gustavus Bryant, MS, RD, LDN Inpatient Clinical Dietitian Please see AMiON for contact information.

## 2020-09-15 NOTE — Progress Notes (Signed)
PROGRESS NOTE  Karen Casey ZOX:[id number] DOB: 1955/06/30 DOA: 09/09/2020 PCP: [name], MD  HPI/Recap of past 41 hours: 65 year old female with PMHx significant for depression/anxiety (chronic Xanax use with history of seizure 2/2 withdrawal 2013), EtOH abuse (drinks estimated 6-10 beers/day) and tobacco abuse (43 pack-years) who presented to [facility] ED via EMS 3/29 after being found altered at home.   Per patient's sister (present at bedside), patient frequently has periods of cyclical vomiting that last up to a week; patient was currently about 7 days into a period of vomiting where she was unable to keep down PO. Sister reports that when patient was unable to tolerate PO/EtOH, she would get upset/anxious and take additional Xanax. Patient's significant other (who lives in the home) reported that patient did not seem well and patient's daughter drove to check on the patient. On daughter's arrival, patient appeared to be altered and covered in feces with decreased responsiveness. EMS was contacted and on arrival, patient became agitated and combative, fighting EMS personnel. Patient received Versed 4mg  total en route to [facility] ED.  On arrival to [facility] ED, patient was still slightly agitated but calm enough to answer some questions for ED staff. Given AMS, she was taken for CT Head during which she experienced a seizure (witnessed, non-focal shaking movements, 3-5 minutes long per CT staff). Received a total of 4mg  Ativan. Patient was moved back to the ED minimally responsive (likely post-ictal) and not protecting her airway and was subsequently intubated by ED staff. At the time of intubation, patient was noted to have partially dried blood-tinged secretions around her mouth and a small tongue laceration was noted (likely sustained during seizure). Neurology was consulted for further evaluation of seizure activity with recommendation for LTM. PCCM was consulted for ICU admission for AMS,  seizure/likely EtOH withdrawal and ventilator management.  TRH assumed care on 09/15/2020.  09/15/20: Patient was seen and examined at her bedside.  She is alert and she is oriented.  Audible wheezing noted.  She currently denies any chest pain or dyspnea while at rest.  She has a somewhat weak cough on exam, speech therapist has been consulted for swallow evaluation.  Will continue to monitor in the ICU for now and obtain a chest x-ray.  Assessment/Plan: Active Problems:   Alcohol withdrawal (HCC)  Seizure likely secondary to EtOH/benzo withdrawal Presented to [facility] ED via EMS for AMS at home. History of EtOH abuse and benzo use (Xanax, prescribed by psychiatrist). Of note, patient has a history of seizure 2/2 benzo withdrawal (2013). Per patient's sister, drinks 6-10 beers/day at baseline; unable to tolerate PO for one week prior to admission and had taken additional Xanax/run out. Witnessed seizure in CT scanner x 3-5 minutes 3/29PM with subsequent post-ictal state requiring intubation. - No role for AEDs at this time, per Neuro  -Continue seizure precautions. - CIWA protocol + thiamine/folate -She is now off Precedex\ -She is currently on Ativan as needed for seizures and as part of the CIWA protocol. -- Neuro recommendations - 'Continue Xanax 2mg  BID for now(home dose) along with CIWA protocol. Once out of withdrawal window, plan for slow prolonged outpatient Xanax taper over a couple months. Discussed no driving. If she does go back to drinking EtOH, recommend that she not try to quit at home and instead come to the hospital for managed withdrawal given seizure.'  Acute hypoxic respiratory failure likely secondary to pneumonitis from aspiration Not on oxygen supplementation at baseline Currently requiring 2 L to  maintain O2 saturation greater than 92%. As needed albuterol neb. Continue to monitor  Dysphagia She was assessed by speech therapist on 4-22 with recommendation for dysphagia 1,  pure, thin liquid diet. Continue aspiration precautions.  Aspiration with emesis - Continue Rocephin D6/7 -Repeat chest x-ray on 09/15/2020 due to audible wheezing  Atrial fibrillation -incited by sepsis, electrolytes and dehydration -Intermittent A. fib with RVR -TSH 0.827 on 09/12/2020. - Echo wnl -Had reverted to normal sinus rhythm but back in RVR 4/1 -Repeat twelve-lead EKG on 09/15/2020. -CHA2DS2-VASc score of 1 -started Lovenox full dose twice daily. -Continue low-dose Lopressor for rate control - Correct electrolytes for K >4 and Mg >2  Hypomagnesemia Serum magnesium 1.6 Repleted intravenously. Recheck in the morning.  Chronic anxiety Continue home celexa, gabapentin, Abilify, and seroquel Continue home Xanax 2 twice daily Cymbalta has been on hold.  Tobacco use disorder Continue nicotine patch.  Physical debility/ambulatory dysfunction PT assessment recommended home health PT Continue fall precautions.   Code Status: Full code.  Family Communication: None at bedside.  Disposition Plan: Likely will discharge to home with home health services.   Consultants:  PCCM  Procedures:  Intubation  Extubation.  Antimicrobials:  None.  DVT prophylaxis: Subcu Lovenox, full dose, twice daily.  Status is: Inpatient    Dispo:  Patient From: Home  Planned Disposition: Home  Medically stable for discharge: No, ongoing management of acute hypoxic respiratory failure, dysphagia.          Objective: Vitals:   09/15/20 0700 09/15/20 0719 09/15/20 0733 09/15/20 1146  BP: 139/69     Pulse: 97     Resp: (!) 32     Temp:  98.4 F (36.9 C)  97.9 F (36.6 C)  TempSrc:  Oral  Oral  SpO2: 93%  94%   Weight:      Height:        Intake/Output Summary (Last 24 hours) at 09/15/2020 1232 Last data filed at 09/15/2020 0600 Gross per 24 hour  Intake 1413.57 ml  Output 1500 ml  Net -86.43 ml   Filed Weights   09/13/20 0500 09/14/20 0500 09/15/20 0459   Weight: 97.9 kg 97.9 kg 96.7 kg    Exam:  . General: 65 y.o. year-old female well developed well nourished in no acute distress.  Alert and oriented x3. . Cardiovascular: Regular rate and rhythm with no rubs or gallops.  No thyromegaly or JVD noted.   Marland Kitchen Respiratory: Diffuse wheezing bilaterally.  Poor inspiratory effort.  On 2 L nasal cannula.   . Abdomen: Soft nontender nondistended with normal bowel sounds x4 quadrants. . Musculoskeletal: No lower extremity edema bilaterally. . Skin: No ulcerative lesions noted or rashes, . Psychiatry: Mood is appropriate for condition and setting   Data Reviewed: CBC: Recent Labs  Lab 09/09/20 2147 09/10/20 0028 09/12/20 0116 09/12/20 0811 09/12/20 1855 09/13/20 0422 09/14/20 0333 09/15/20 0319  WBC 20.9*   < > 10.5 9.8  --  7.9 7.6 8.9  NEUTROABS 19.0*  --   --   --   --   --   --   --   HGB 17.7*   < > 11.9* 11.9* 11.2* 11.6* 11.7* 12.1  HCT 48.7*   < > 35.9* 35.8* 33.0* 35.4* 36.6 37.9  MCV 91.2   < > 100.6* 99.7  --  102.9* 102.2* 103.3*  PLT 209   < > 135* 142*  --  142* 155 204   < > = values in this interval not  displayed.   Basic Metabolic Panel: Recent Labs  Lab 09/12/20 0116 09/12/20 0811 09/12/20 1855 09/13/20 0422 09/14/20 0333 09/15/20 0319  NA 131* 131* 132* 131* 136 135  K 3.5 4.4 4.8 4.4 4.2 4.0  CL 102 101  --  99 97* 92*  CO2 24 25  --  25 32 36*  GLUCOSE 149* 129*  --  99 113* 119*  BUN 14 14  --  9 6* <5*  CREATININE 0.65 0.64  --  0.62 0.52 0.46  CALCIUM 8.3* 8.4*  --  8.5* 8.9 8.7*  MG 1.8 2.2  --  1.8 1.7 1.6*  PHOS 2.6 2.9  --  3.1 3.2 3.1   GFR: Estimated Creatinine Clearance: 85.5 mL/min (by C-G formula based on SCr of 0.46 mg/dL). Liver Function Tests: Recent Labs  Lab 09/09/20 2147 09/10/20 0303  AST 46* 45*  ALT 34 32  ALKPHOS 66 68  BILITOT 1.7* 1.6*  PROT 7.0 7.0  ALBUMIN 4.0 3.8   No results for input(s): LIPASE, AMYLASE in the last 168 hours. Recent Labs  Lab 09/09/20 2147   AMMONIA 30   Coagulation Profile: Recent Labs  Lab 09/09/20 2147  INR 1.2   Cardiac Enzymes: No results for input(s): CKTOTAL, CKMB, CKMBINDEX, TROPONINI in the last 168 hours. BNP (last 3 results) No results for input(s): PROBNP in the last 8760 hours. HbA1C: No results for input(s): HGBA1C in the last 72 hours. CBG: Recent Labs  Lab 09/15/20 0306 09/15/20 0307 09/15/20 0308 09/15/20 0718 09/15/20 1144  GLUCAP 43* 71 98 138* 152*   Lipid Profile: No results for input(s): CHOL, HDL, LDLCALC, TRIG, CHOLHDL, LDLDIRECT in the last 72 hours. Thyroid Function Tests: No results for input(s): TSH, T4TOTAL, FREET4, T3FREE, THYROIDAB in the last 72 hours. Anemia Panel: No results for input(s): VITAMINB12, FOLATE, FERRITIN, TIBC, IRON, RETICCTPCT in the last 72 hours. Urine analysis:    Component Value Date/Time   COLORURINE YELLOW 09/10/2020 0312   APPEARANCEUR CLOUDY (A) 09/10/2020 0312   LABSPEC 1.014 09/10/2020 0312   PHURINE 6.0 09/10/2020 0312   GLUCOSEU 50 (A) 09/10/2020 0312   HGBUR SMALL (A) 09/10/2020 0312   BILIRUBINUR NEGATIVE 09/10/2020 0312   BILIRUBINUR negative 08/23/2017 0936   BILIRUBINUR NEG 12/22/2015 1410   KETONESUR 20 (A) 09/10/2020 0312   PROTEINUR 100 (A) 09/10/2020 0312   UROBILINOGEN 0.2 08/23/2017 0936   UROBILINOGEN 1.0 03/09/2013 1013   NITRITE NEGATIVE 09/10/2020 0312   LEUKOCYTESUR NEGATIVE 09/10/2020 0312   Sepsis Labs: @LABRCNTIP (procalcitonin:4,lacticidven:4)  ) Recent Results (from the past 240 hour(s))  Culture, blood (routine x 2)     Status: None (Preliminary result)   Collection Time: 09/09/20  9:47 PM   Specimen: BLOOD LEFT FOREARM  Result Value Ref Range Status   Specimen Description BLOOD LEFT FOREARM  Final   Special Requests   Final    BOTTLES DRAWN AEROBIC AND ANAEROBIC Blood Culture adequate volume   Culture   Final    NO GROWTH 4 DAYS Performed at [facility], [place] [address] [place], [place] [address]     Report Status PENDING  Incomplete  Resp Panel by RT-PCR (Flu A&B, Covid) Nasopharyngeal Swab     Status: None   Collection Time: 09/09/20  9:49 PM   Specimen: Nasopharyngeal Swab; Nasopharyngeal(NP) swabs in vial transport medium  Result Value Ref Range Status   SARS Coronavirus 2 by RT PCR NEGATIVE NEGATIVE Final    Comment: (NOTE) SARS-CoV-2 target nucleic acids are NOT  DETECTED.  The SARS-CoV-2 RNA is generally detectable in upper respiratory specimens during the acute phase of infection. The lowest concentration of SARS-CoV-2 viral copies this assay can detect is 138 copies/mL. A negative result does not preclude SARS-Cov-2 infection and should not be used as the sole basis for treatment or other patient management decisions. A negative result may occur with  improper specimen collection/handling, submission of specimen other than nasopharyngeal swab, presence of viral mutation(s) within the areas targeted by this assay, and inadequate number of viral copies(<138 copies/mL). A negative result must be combined with clinical observations, patient history, and epidemiological information. The expected result is Negative.  Fact Sheet for Patients:  EntrepreneurPulse.com.au  Fact Sheet for Healthcare Providers:  IncredibleEmployment.be  This test is no t yet approved or cleared by the [place] FDA and  has been authorized for detection and/or diagnosis of SARS-CoV-2 by FDA under an Emergency Use Authorization (EUA). This EUA will remain  in effect (meaning this test can be used) for the duration of the COVID-19 declaration under Section 564(b)(1) of the Act, 21 U.S.C.section 360bbb-3(b)(1), unless the authorization is terminated  or revoked sooner.       Influenza A by PCR NEGATIVE NEGATIVE Final   Influenza B by PCR NEGATIVE NEGATIVE Final    Comment: (NOTE) The Xpert Xpress SARS-CoV-2/FLU/RSV plus assay is intended as an aid in the  diagnosis of influenza from Nasopharyngeal swab specimens and should not be used as a sole basis for treatment. Nasal washings and aspirates are unacceptable for Xpert Xpress SARS-CoV-2/FLU/RSV testing.  Fact Sheet for Patients: EntrepreneurPulse.com.au  Fact Sheet for Healthcare Providers: IncredibleEmployment.be  This test is not yet approved or cleared by the [place] FDA and has been authorized for detection and/or diagnosis of SARS-CoV-2 by FDA under an Emergency Use Authorization (EUA). This EUA will remain in effect (meaning this test can be used) for the duration of the COVID-19 declaration under Section 564(b)(1) of the Act, 21 U.S.C. section 360bbb-3(b)(1), unless the authorization is terminated or revoked.  Performed at [facility], [place] [address] [place], [place] [address]   Culture, blood (routine x 2)     Status: None (Preliminary result)   Collection Time: 09/09/20 11:48 PM   Specimen: BLOOD RIGHT HAND  Result Value Ref Range Status   Specimen Description BLOOD RIGHT HAND  Final   Special Requests   Final    BOTTLES DRAWN AEROBIC AND ANAEROBIC Blood Culture adequate volume   Culture   Final    NO GROWTH 4 DAYS Performed at [facility], [place] [address], [place], [place] [address]    Report Status PENDING  Incomplete  Culture, Respiratory w Gram Stain     Status: None   Collection Time: 09/10/20  3:03 AM   Specimen: Tracheal Aspirate; Respiratory  Result Value Ref Range Status   Specimen Description TRACHEAL ASPIRATE  Final   Special Requests NONE  Final   Gram Stain   Final    NO WBC SEEN MODERATE GRAM POSITIVE COCCI IN PAIRS IN CLUSTERS    Culture   Final    FEW Normal respiratory flora-no Staph aureus or Pseudomonas seen Performed at [facility], [address], [place], [place] [address]    Report Status 09/12/2020 FINAL  Final  Culture, Urine     Status: None   Collection Time: 09/10/20   3:12 AM   Specimen: Urine, Random  Result Value Ref Range Status   Specimen Description  URINE, RANDOM  Final   Special Requests NONE  Final   Culture   Final    NO GROWTH Performed at [facility], [place] [address] [place], [place] [address]    Report Status 09/11/2020 FINAL  Final  MRSA PCR Screening     Status: None   Collection Time: 09/10/20 11:21 PM   Specimen: Nasal Mucosa; Nasopharyngeal  Result Value Ref Range Status   MRSA by PCR NEGATIVE NEGATIVE Final    Comment:        The GeneXpert MRSA Assay (FDA approved for NASAL specimens only), is one component of a comprehensive MRSA colonization surveillance program. It is not intended to diagnose MRSA infection nor to guide or monitor treatment for MRSA infections. Performed at [facility], [place] [address], [place], [place] [address]       Studies: No results found.  Scheduled Meds: . ALPRAZolam  2 mg Oral BID  . amLODipine  10 mg Oral Daily  . ARIPiprazole  5 mg Oral Daily  . chlorhexidine  15 mL Mouth Rinse BID  . Chlorhexidine Gluconate Cloth  6 each Topical Daily  . citalopram  40 mg Oral Daily  . enoxaparin (LOVENOX) injection  1 mg/kg Subcutaneous Q12H  . feeding supplement  237 mL Oral TID BM  . folic acid  1 mg Oral Daily  . gabapentin  1,200 mg Oral TID  . Gerhardt's butt cream   Topical QID  . ipratropium-albuterol  3 mL Nebulization TID  . mouth rinse  15 mL Mouth Rinse q12n4p  . metoprolol tartrate  25 mg Oral BID  . multivitamin with minerals  1 tablet Oral Daily  . nicotine  7 mg Transdermal Daily  . pantoprazole sodium  40 mg Oral QHS  . QUEtiapine  150 mg Oral QHS  . sodium chloride flush  10-40 mL Intracatheter Q12H  . thiamine  100 mg Oral Daily    Continuous Infusions: . sodium chloride Stopped (09/11/20 1028)  . dextrose 5 % and 0.9% NaCl 50 mL/hr at 09/15/20 0600     LOS: 5 days     Kayleen Memos, MD [facility] Pager [phone]  If 7PM-7AM, please  contact night-coverage [email or web address] Password [facility] 09/15/2020, 12:32 PM

## 2020-09-16 ENCOUNTER — Inpatient Hospital Stay (HOSPITAL_COMMUNITY): Payer: 59

## 2020-09-16 DIAGNOSIS — G40901 Epilepsy, unspecified, not intractable, with status epilepticus: Secondary | ICD-10-CM | POA: Diagnosis present

## 2020-09-16 DIAGNOSIS — J9601 Acute respiratory failure with hypoxia: Secondary | ICD-10-CM | POA: Diagnosis not present

## 2020-09-16 DIAGNOSIS — I480 Paroxysmal atrial fibrillation: Secondary | ICD-10-CM | POA: Diagnosis not present

## 2020-09-16 DIAGNOSIS — R072 Precordial pain: Secondary | ICD-10-CM | POA: Diagnosis present

## 2020-09-16 DIAGNOSIS — R569 Unspecified convulsions: Secondary | ICD-10-CM

## 2020-09-16 DIAGNOSIS — F10931 Alcohol use, unspecified with withdrawal delirium: Secondary | ICD-10-CM

## 2020-09-16 DIAGNOSIS — E871 Hypo-osmolality and hyponatremia: Secondary | ICD-10-CM | POA: Diagnosis present

## 2020-09-16 DIAGNOSIS — F10231 Alcohol dependence with withdrawal delirium: Secondary | ICD-10-CM

## 2020-09-16 DIAGNOSIS — R0602 Shortness of breath: Secondary | ICD-10-CM | POA: Diagnosis present

## 2020-09-16 LAB — GLUCOSE, CAPILLARY
Glucose-Capillary: 125 mg/dL — ABNORMAL HIGH (ref 70–99)
Glucose-Capillary: 110 mg/dL — ABNORMAL HIGH (ref 70–99)
Glucose-Capillary: 121 mg/dL — ABNORMAL HIGH (ref 70–99)
Glucose-Capillary: 151 mg/dL — ABNORMAL HIGH (ref 70–99)

## 2020-09-16 LAB — CULTURE, BLOOD (ROUTINE X 2)
Culture: NO GROWTH
Culture: NO GROWTH
Special Requests: ADEQUATE
Special Requests: ADEQUATE

## 2020-09-16 LAB — BASIC METABOLIC PANEL
Anion gap: 5 (ref 5–15)
BUN: <5 mg/dL — ABNORMAL LOW (ref 8–23)
CO2: 41 mmol/L — ABNORMAL HIGH (ref 22–32)
Calcium: 8.6 mg/dL — ABNORMAL LOW (ref 8.9–10.3)
Chloride: 89 mmol/L — ABNORMAL LOW (ref 98–111)
Creatinine, Ser: 0.5 mg/dL (ref 0.44–1.00)
GFR, Estimated: >60 mL/min (ref >60)
Glucose, Bld: 108 mg/dL — ABNORMAL HIGH (ref 70–99)
Potassium: 3.7 mmol/L (ref 3.5–5.1)
Sodium: 135 mmol/L (ref 135–145)

## 2020-09-16 LAB — CBC
HCT: 37.1 % (ref 36.0–46.0)
Hemoglobin: 11.8 g/dL — ABNORMAL LOW (ref 12.0–15.0)
MCH: 33.1 pg (ref 26.0–34.0)
MCHC: 31.8 g/dL (ref 30.0–36.0)
MCV: 103.9 fL — ABNORMAL HIGH (ref 80.0–100.0)
Platelets: 216 10*3/uL (ref 150–400)
RBC: 3.57 MIL/uL — ABNORMAL LOW (ref 3.87–5.11)
RDW: 12.8 % (ref 11.5–15.5)
WBC: 8 10*3/uL (ref 4.0–10.5)
nRBC: 0 % (ref 0.0–0.2)

## 2020-09-16 LAB — TROPONIN I (HIGH SENSITIVITY)
Troponin I (High Sensitivity): 8 ng/L (ref <18)
Troponin I (High Sensitivity): 9 ng/L (ref <18)

## 2020-09-16 LAB — MAGNESIUM: Magnesium: 1.8 mg/dL (ref 1.7–2.4)

## 2020-09-16 MED ORDER — FUROSEMIDE 10 MG/ML IJ SOLN
20.0000 mg | Freq: Every day | INTRAMUSCULAR | Status: DC
  Administered 2020-09-17 – 2020-09-19 (×3): 20 mg via INTRAVENOUS
  Filled 2020-09-16 (×3): qty 2

## 2020-09-16 MED ORDER — HEPARIN (PORCINE) 25000 UT/250ML-% IV SOLN
1200.0000 [IU]/h | INTRAVENOUS | Status: DC
  Administered 2020-09-16: 1100 [IU]/h via INTRAVENOUS
  Administered 2020-09-17: 1200 [IU]/h via INTRAVENOUS
  Filled 2020-09-16 (×3): qty 250

## 2020-09-16 MED ORDER — LIDOCAINE VISCOUS HCL 2 % MT SOLN
15.0000 mL | Freq: Once | OROMUCOSAL | Status: AC
  Administered 2020-09-16: 15 mL via ORAL
  Filled 2020-09-16: qty 15

## 2020-09-16 MED ORDER — MAGNESIUM SULFATE 2 GM/50ML IV SOLN
2.0000 g | Freq: Once | INTRAVENOUS | Status: AC
  Administered 2020-09-16: 2 g via INTRAVENOUS
  Filled 2020-09-16: qty 50

## 2020-09-16 MED ORDER — POLYETHYLENE GLYCOL 3350 17 G PO PACK: 17.0000 g | PACK | Freq: Every day | ORAL | Status: DC

## 2020-09-16 MED ORDER — POTASSIUM CHLORIDE 20 MEQ PO PACK
40.0000 meq | PACK | Freq: Once | ORAL | Status: AC
  Administered 2020-09-16: 40 meq via ORAL
  Filled 2020-09-16: qty 2

## 2020-09-16 MED ORDER — DILTIAZEM HCL-DEXTROSE 125-5 MG/125ML-% IV SOLN (PREMIX)
5.0000 mg/h | INTRAVENOUS | Status: DC
  Administered 2020-09-16: 5 mg/h via INTRAVENOUS
  Filled 2020-09-16: qty 125

## 2020-09-16 MED ORDER — METOPROLOL TARTRATE 50 MG PO TABS
50.0000 mg | ORAL_TABLET | Freq: Two times a day (BID) | ORAL | Status: DC
  Administered 2020-09-16: 50 mg via ORAL
  Filled 2020-09-16: qty 1

## 2020-09-16 MED ORDER — ALUM & MAG HYDROXIDE-SIMETH 200-200-20 MG/5ML PO SUSP
30.0000 mL | Freq: Once | ORAL | Status: AC
  Administered 2020-09-16: 30 mL via ORAL
  Filled 2020-09-16: qty 30

## 2020-09-16 MED ORDER — FUROSEMIDE 10 MG/ML IJ SOLN
20.0000 mg | Freq: Once | INTRAMUSCULAR | Status: AC
  Administered 2020-09-16: 20 mg via INTRAVENOUS
  Filled 2020-09-16: qty 2

## 2020-09-16 MED ORDER — FUROSEMIDE 10 MG/ML IJ SOLN: 20.0000 mg | Freq: Every day | INTRAMUSCULAR | Status: DC

## 2020-09-16 NOTE — Progress Notes (Signed)
FPTS Interim Progress Note  S: Went to evaluate patient at bedside along with Dr. Jeannine Kitten. Patient awake and alert, complaining of her tongue still hurting from tongue bite from previous seizure on admission and mild abdominal pain. Denies dyspnea.   O: BP 138/89   Pulse (!) 117   Temp 97.9 F (36.6 C) (Oral)   Resp (!) 32   Ht 5\' 6"  (1.676 m)   Wt 96.9 kg   SpO2 95%   BMI 34.48 kg/m   General: Patient sitting upright in bed, in no acute distress. CV: tachycardic, irregular rhythm, no murmurs auscultated Resp: faint crackles noted more prominently along upper lung lobes bilaterally without other focal findings, no respiratory distress noted Abdomen: soft, tenderness along epigastric region, BS+ Neuro: alert and answering questions appropriately Pscyh: mood appropriate   A/P: -patient alert and exhibiting appropriate mentation at this time, epigastric pain likely due to reflux, continue protonix. Faint crackles likely from pulmonary edema, fluids have previously been discontinued, continue IV lasix 20 mg daily.  -if patient's respiratory status declines, will acquire ABG and use BiPAP  Donney Dice, DO 09/16/2020, 9:40 PM PGY-1, [place] Medicine Service pager [phone]

## 2020-09-16 NOTE — Progress Notes (Addendum)
Family Medicine Teaching Service Daily Progress Note Intern Pager: [phone]  Patient name: Karen Casey Medical record number: [id number] Date of birth: August 20, 1955 Age: 65 y.o. Gender: female  Primary Care Provider: [name], MD Consultants: CCM (s/o), cardiology Code Status: Full   Pt Overview and Major Events to Date:  3/29 Seizure, intubated, admitted to ICU 3/30 Precedex started 4/01 Extubated 4/03 Off Precedex (last 4/2 @2100 ) 4/04 Transferred to care of [facility] 4/05 Transferred to care of [place] Team  Assessment and Plan: Ms. Iglesia is a 65 year old female who originally presented to the ED with cyclical vomiting, AMS, agitation and was found to be in alcohol and benzo withdrawal.  Subsequently had seizure, was intubated, and admitted to ICU.  Now under care of family medicine inpatient team.  PMH includes depression, anxiety, chronic Xanax use (history of seizure from withdrawal to thousand 13), EtOH abuse (drinks 6-10 beers/day), tobacco abuse (43 pack years).  ETOH/benzo withdrawal No seizures since admission.  Last CIWA score of 5 on 4/4 at 1400.  Neurology recommends continuing Xanax but trying to wean down. -Continue CIWA precautions -CIWA protocol -Continue thiamine, folate -Xanax 2 mg twice daily  Acute hypoxic respiratory failure Thought to be due to to pneumonitis from aspiration.  S/p 7-day course Rocephin while in ICU.  Currently on 4 L nasal cannula with SPO2 94-25%.  CXR this morning demonstrated diffuse bilateral pulmonary infiltrates/edema, small bilateral pleural effusions; however no changes from CXR day prior. -Continue O2 with SPO2 goal >94% -Wean O2 as tolerated -Albuterol nebulizer as needed  Dysphagia Patient continues to have dysphagia s/p extubation.  Barium swallow study performed this afternoon, SLP recommends dysphagia 2 diet. -Aspiration precautions -Dysphagia 2 diet  New onset atrial  fibrillation Thought to be incited by sepsis, electrolyte derangements, dehydration.  Persistent now despite stabilization.  TSH and echo normal.  Electrolyte goals: Potassium >4, magnesium >2. -Cardiology consulted, appreciate recommendations -Continue metoprolol 50 mg twice daily -Metoprolol 5 mg every 3 as needed for heart rate >130 remnant from admission to the medicine service.  We will keep for now, however likely discontinue tomorrow.  Hypokalemia Potassium goal given new onset atrial fibrillation is >4.  Potassium 3.7 today.  Administered potassium chloride packet 40 mEq x 1 at 1557 today. -Follow-up a.m. BMP  Hypomagnesemia Magnesium goal given new onset atrial fibrillation >2.  Given 2 g magnesium today. -Follow-up a.m. mag level  Chronic anxiety Home meds include Celexa, gabapentin, Abilify, Seroquel, Xanax, Cymbalta. -Per neurology, will provide Xanax 2 mg twice daily (with long-term goal to taper down as outpatient) -Holding Cymbalta -Continuing Celexa, gabapentin, Abilify, Seroquel -Low threshold to pause or reduce these medications if patient becomes sleepy, somnolent, or altered  Long-term tobacco use Continue nicotine patch   FEN/GI: Dysphagia 2 diet PPx: Lovenox  Status is: Inpatient  Remains inpatient appropriate because:Altered mental status, Ongoing diagnostic testing needed not appropriate for outpatient work up, IV treatments appropriate due to intensity of illness or inability to take PO and Inpatient level of care appropriate due to severity of illness   Dispo:  Patient From: Home  Planned Disposition: Home with Health Care Svc  Medically stable for discharge: No     Subjective:  This morning, patient found asleep in chair.  Quite difficult to wake, and once awoken falls asleep in between questions.  Required numerous awakenings.  No complaints.  Objective: Temp:  [97.8 F (36.6 C)-98.8 F (37.1 C)] 98.3 F (36.8 C) (04/05 1510) Pulse  Rate:   [90-125] 122 (04/05 0600) Resp:  [21-32] 32 (04/05 0600) BP: (88-146)/(50-84) 119/61 (04/05 0600) SpO2:  [88 %-95 %] 95 % (04/05 1631) Weight:  [96.9 kg] 96.9 kg (04/05 0500) Physical Exam: General: Sleepy, difficult to arouse, oriented to self Cardiovascular: Irregularly irregular rhythm, rate in the 110s, extremities well perfused Respiratory: Moderate wheezing of anterior lung fields Abdomen: Soft, nontender Extremities: No BLE edema  Laboratory: Recent Labs  Lab 09/14/20 0333 09/15/20 0319 09/16/20 0352  WBC 7.6 8.9 8.0  HGB 11.7* 12.1 11.8*  HCT 36.6 37.9 37.1  PLT 155 204 216   Recent Labs  Lab 09/09/20 2147 09/10/20 0028 09/10/20 0303 09/10/20 0529 09/14/20 0333 09/15/20 0319 09/16/20 0352  NA 119*   < > 120*   < > 136 135 135  K 3.5   < > 2.9*   < > 4.2 4.0 3.7  CL 81*  --  82*   < > 97* 92* 89*  CO2 23  --  28   < > 32 36* 41*  BUN <5*  --  6*   < > 6* <5* <5*  CREATININE 0.78  --  1.08*   < > 0.52 0.46 0.50  CALCIUM 9.2  --  9.3   < > 8.9 8.7* 8.6*  PROT 7.0  --  7.0  --   --   --   --   BILITOT 1.7*  --  1.6*  --   --   --   --   ALKPHOS 66  --  68  --   --   --   --   ALT 34  --  32  --   --   --   --   AST 46*  --  45*  --   --   --   --   GLUCOSE 118*  --  116*   < > 113* 119* 108*   < > = values in this interval not displayed.    Imaging/Diagnostic Tests: PORTABLE CHEST 1 VIEW 09/16/2020 COMPARISON:  09/15/2020. FINDINGS: Right PICC line noted in stable position. Cardiomegaly. Diffuse bilateral pulmonary infiltrates/edema in small bilateral pleural effusions again noted without interim change. No change thoracic spine. IMPRESSION: 1.  PICC line stable position. 2.  Cardiomegaly. 3. Diffuse bilateral pulmonary infiltrates/edema and small bilateral pleural effusions again noted. No interim change.  MBS-Modified Barium Swallow Study 09/16/2020  Pt presents with mild oropharyngeal dysphagia, characterized as follows:  Oral: Pt has poor and  missing dentition, which impairs her ability to masticate solids efficiently. This results in extended oral prep, which could increase aspiration risk with increased fatigue. No anterior leakage was noted, and no pocketing was seen.  Pharyngeal: Pt exhibits swallow reflex at the level of the pyriform sinus on thin and nectar thick liquids, and at the vallecular sinus on puree and solid textures. There is no post swallow residue following any consistency. Pt exhibits intermittent flash penetration of thin and nectar thick liquids during the swallow. Straw use was noted to result in more significant and deeper penetration. Pt was observed to be impulsive with liquids, taking a very large bolus orally when instructed to take a very small sip. Small sips via cup did not result in penetration.   Recommend dys 2 (finely chopped) solids with thin liquids via CUP ONLY - no straws. Pt will need 1:1 supervision to manage bolus size and rate to maximize swallow safety. Medications should be given in puree. Large pills crushed.  Safe swallow precautions were reviewed with pt and RN after this study, and sent with transport back to pt's room.  SLP will follow for education and to assess tolerance of advanced textures.     Ezequiel Essex, MD 09/16/2020, 5:36 PM PGY-1, [place] Intern pager: [phone], text pages welcome

## 2020-09-16 NOTE — Hospital Course (Addendum)
Altered mental status  Seizure  Patient presented to the ED altered, pertinent past medical history includes EtOH abuse and benzo use (Xanax prescribed by psychiatrist). Patient noted to have witnessed seizure while getting CT head that lasted about 3-5 minutes with presence of post-ictal state that later requiring intubation and consequent ICU admission. CT head unremarkable. Neurology consulted, EEG performed demonstrated no epileptiform discharge or seizures noted during recording although this was noted to be an abnormal study given excess beta activity secondary to sedation. This acute encephalopathy and altered mental status is likely secondary to alcohol and substance abuse. Neurology's recommendations include continuing xanax 2 mg bid with continued taper outpatient. Patient extubated 3 days following intubation and transferred from the ICU to floor status 2 days later. Patient returned back to baseline mental status well before discharge and did not have any further seizures throughout the remainder of her hospital stay. Seizure precautions given.   Acute hypoxic respiratory failure Thought to be secondary to pulmonary edema given development of productive cough and dyspnea that required patient to utilize a new oxygen requirement of 2L O2, especially overnight. Given lasix.Patient was weaned off oxygen and had no issues on room air until ambulating with pulse ox caused her to desaturate to 88%. Placed on 2L O2 again and discharged on this new oxygen requirement to be used as needed. Patient also discharged on lasix 20 mg along with potassium supplementation.    Electrolyte abnormalities  Patient noted to have hypomagnesemia, hyponatremia and hypokalemia on multiple occasional throughout hospitalization, thought to be in the setting of dehydration. Appropriate repletion given along with IVF. Cymbalta was discontinued for a short amount of time during hospitalization. Electrolyte abnormalities  resolved prior to discharge, home med cymbalta restarted on discharge. Given atrial fibrillation, electrolyte goals include potassium >4 and magnesium >2.   New onset atrial fibrillation Presented with new onset atrial fibrillation while in the ED, possibly multifactorial given state of dehydration along with multiple electrolyte abnormalities. Cardiology consulted, patient initially placed on diltiazem bolus with continued cardiac monitoring and metoprolol. Transitioned from diltiazem. Started on eliquis 5 mg bid daily. Cardiology's recommendations included to continue metoprolol and eliquis. Atrial fibrillation noted to resolve well before day of discharge. Instructed to continue eliquis for 4 weeks following discharge with plan to follow up with cardiology outpatient for possible cardioversion.   All other issues chronic and stable.   Issues for Follow Up  Metoprolol dose 100 mg bid and eliquis 5 mg bid, ensure patient is compliant with this and all other medications.  Have patient taper Xanax by 25% every 2-4 weeks as outpatient.  Recommend consider tapering gabapentin if appropriate given sedating properties.  Ensure patient does not drive or operate machinery for 6 months given recent seizure.  Ensure continues to follow with psychiatry for appropriate psych med management. Discharged on potassium and Lasix 20mg  daily. Recommend checking BMP at follow up, given persistent electrolyte derangements during hospitalization.  Discharged on oxygen which she needed during ambulation Patient restarted on home med cymbalta although she had multiple episodes of hyponatremia during this hospitalization. Recheck BMP levels periodically to ensure no electrolyte abnormalities exist.  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  Ensure patient follows up with cardiology outpatient for possible cardioversion if appropriate.  Encourage  tobacco cessation.  Neurology's recommendations include continuing xanax 2 mg bid with continued taper outpatient.  Neuro recommendations - 'Continue Xanax 2mg  BID for now(home dose) along with  CIWA protocol. Once out of withdrawal window, plan for slow prolonged outpatient Xanax taper over a couple months. Discussed no driving. If she does go back to drinking EtOH, recommend that she not try to quit at home and instead come to the hospital for managed withdrawal given seizure

## 2020-09-16 NOTE — Progress Notes (Signed)
Modified Barium Swallow Progress Note  Patient Details  Name: Karen Casey MRN: [id number] Date of Birth: 11/18/1955  Today's Date: 09/16/2020  Modified Barium Swallow completed.  Full report located under Chart Review in the Imaging Section.  Brief recommendations include the following:  Clinical Impression Pt presents with mild oropharyngeal dysphagia, characterized as follows:  Oral: Pt has poor and missing dentition, which impairs her ability to masticate solids efficiently. This results in extended oral prep, which could increase aspiration risk with increased fatigue. No anterior leakage was noted, and no pocketing was seen.  Pharyngeal: Pt exhibits swallow reflex at the level of the pyriform sinus on thin and nectar thick liquids, and at the vallecular sinus on puree and solid textures. There is no post swallow residue following any consistency. Pt exhibits intermittent flash penetration of thin and nectar thick liquids during the swallow. Straw use was noted to result in more significant and deeper penetration. Pt was observed to be impulsive with liquids, taking a very large bolus orally when instructed to take a very small sip. Small sips via cup did not result in penetration.   Recommend dys 2 (finely chopped) solids with thin liquids via CUP ONLY - no straws. Pt will need 1:1 supervision to manage bolus size and rate to maximize swallow safety. Medications should be given in puree. Large pills crushed. Safe swallow precautions were reviewed with pt and RN after this study, and sent with transport back to pt's room.  SLP will follow for education and to assess tolerance of advanced textures.     Swallow Evaluation Recommendations  SLP Diet Recommendations: Dysphagia 2 (Fine chop) solids;Thin liquid   Liquid Administration via: Cup;No straw   Medication Administration: Whole meds with puree (crush large pills)   Supervision: Patient able to self feed;Full assist for  feeding;Full supervision/cueing for compensatory strategies   Compensations: Slow rate;Small sips/bites;Minimize environmental distractions   Postural Changes: Seated upright at 90 degrees;Remain semi-upright after after feeds/meals (Comment)   Oral Care Recommendations: Oral care BID      Harvir Patry B. Quentin Ore, [facility], [place] Speech Language Pathologist Office: [phone]  Shonna Chock 09/16/2020,2:32 PM

## 2020-09-16 NOTE — Progress Notes (Addendum)
FPTS Interim Progress Note  S:Received page from nurse Shirlean Mylar that patient complains of shortness of breath and wheezing and her lung exam demonstrated crackles new since this morning. Nurse concerned for pulmonary congestion, wonders if she needs additional dose of Lasix.  RT has given breathing treatments x2 without relief.  Upon arriving to the room, patient also complains of feeling of heartburn and sternal chest pain (reproducible with palpation).   O: BP 119/61   Pulse (!) 122   Temp 98.3 F (36.8 C) (Oral)   Resp (!) 32   Ht 5\' 6"  (1.676 m)   Wt 96.9 kg   SpO2 95%   BMI 34.48 kg/m   General: Awake, upright, alert, mild distress Cardiac: Irregularly irregular rhythm, rate 110s, skin warm, cap refill <2, reproducible sternal chest pain with palpation Respiratory: Inspiratory and expiratory wheezes in all lung fields along with crackles in posterior lung bases Extremities: Trace edema in RLE, no edema in LLE  A/P: Worsened SOB, chest pain Likely pulmonary edema, but differential includes ACS shortness of breath and chest pain. -Stat EKG -GI cocktail -Lasix 20 mg once -Stat troponin with 2-hour repeat to trend -CXR   Ezequiel Essex, MD 09/16/2020, 5:00 PM PGY-1, [place] Medicine Service pager [phone]

## 2020-09-16 NOTE — Consult Note (Signed)
   [facility] CM Inpatient Consult   09/16/2020  SADEY YANDELL [birth year] [id number]   [place] [facility] [ACO] Patient: [facility]  Referral from inpatient [facility], [place] for ETOH abuse follow up, patient listed as in  [place]  Patient evaluated for community based chronic complex disease management services with [place] [facility] as a benefit of patient's [employer].  Requested that patient have [place] worker added to home health. Plan:  Will refer patient for chronic care management in the Embedded Team.     Of note, [facility] Care Management services does not replace or interfere with any services that are arranged by inpatient case management or social work.  For additional questions or referrals please contact:    Natividad Brood, RN BSN [facility] Liaison  [phone] business mobile phone Toll free office [phone]  Fax number: [phone] [email or web address]  [email or web address]

## 2020-09-16 NOTE — Progress Notes (Signed)
ANTICOAGULATION CONSULT NOTE - Initial Consult  Pharmacy Consult for Lovenox Indication: atrial fibrillation  No Known Allergies  Patient Measurements: Height: 5\' 6"  (167.6 cm) Weight: 96.9 kg (213 lb 10 oz) IBW/kg (Calculated) : 59.3  Vital Signs: Temp: 98.1 F (36.7 C) (04/05 0713) Temp Source: Oral (04/05 0713) BP: 119/61 (04/05 0600) Pulse Rate: 122 (04/05 0600)  Labs: Recent Labs    09/14/20 0333 09/15/20 0319 09/16/20 0352  HGB 11.7* 12.1 11.8*  HCT 36.6 37.9 37.1  PLT 155 204 216  CREATININE 0.52 0.46 0.50    Estimated Creatinine Clearance: 85.5 mL/min (by C-G formula based on SCr of 0.5 mg/dL).   Medical History: Past Medical History:  Diagnosis Date  . Alcohol abuse   . Allergy   . Anxiety   . Cataract 06/09/2012   Right eye and left eye  . Depression   . GERD (gastroesophageal reflux disease)   . 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 Seizures ([place])    xanax withdrawl- December 2013  . Urinary incontinence 06/09/2012   Assessment: 65 yo presented [facility] ED via EMS for AMS at home. History of EtOH abuse and benzo use (Xanax, prescribed by psychiatrist). Per patient's sister, drinks 6-10 beers/day at baseline; has been unable to tolerate PO for one week prior to admission. Witnessed seizure x 3-5 minutes 3/29PM.  Patient developed A fib 09/12/20. Pharmacy consulted to start Lovenox dosing for atrial fibrillation.   4/5: CBC stable. No bleeding noted.    Goal of Therapy:  Anti-Xa level 0.6-1 units/ml 4hrs after LMWH dose given as appropriate  Monitor platelets by anticoagulation protocol: Yes   Plan:  Lovenox 97.5 mg subq BID Monitor for signs and symptoms of bleeding  Monitor CBC  Dimple Nanas, PharmD PGY-1 Acute Care Pharmacy Resident Office: [phone] 09/16/2020 7:57 AM

## 2020-09-16 NOTE — TOC Progression Note (Signed)
Transition of Care [facility]) - Progression Note    Patient Details  Name: Karen Casey MRN: [id number] Date of Birth: [birth year]  Transition of Care [facility]) CM/SW Contact  Bartholomew Crews, RN Phone Number: [phone] 09/16/2020, 5:01 PM  Clinical Narrative:     Noted recommendations for [facility] PT. Referral to [facility] accepted for [facility] PT and SW. Patient will need HH order and Face to Face for PT and SW. [facility] referral for community case management. TOC following for transition needs.        Expected Discharge Plan and Services     Discharge Planning Services: CM Consult                               HH Arranged: PT,Social Work [place]: [facility] [employer] [facility] [employer] (now known as [employer]) Date [place]: 09/16/20 Time [place]: 1659 Representative spoke with at [place]: [place]   Social Determinants of Health ([place]) Interventions    Readmission Risk Interventions No flowsheet data found.

## 2020-09-16 NOTE — Consult Note (Addendum)
CARDIOLOGY CONSULT NOTE  Patient ID: Karen Casey MRN: [id number] DOB/AGE: 63-Jun-1957 63 y.o.  Admit date: 09/09/2020 Referring Physician  Pray, Norwood Levo, MD Primary Physician:  [name], MD Reason for Consultation: atrial fibrillation with RVR   Patient ID: Karen Casey, female    DOB: July 28, 1955, 65 y.o.   MRN: [id number]  Chief Complaint  Patient presents with  . Altered Mental Status   HPI:    Karen Casey  is a 65 y.o. Caucasian female with history of anxiety and depression with chronic Xanax use, alcohol abuse, tobacco dependence with 43-pack-year history.  Patient denies history of hyperlipidemia, MI, CVA/TIA, DVT/PE, diabetes.  Patient presented to [facility] emergency department 09/09/2020 with altered mental status and several days of vomiting.  Due to vomiting patient had been unable to tolerate alcohol intake and was also taking additional Xanax in the week leading up to presentation to the emergency department.  During evaluation in the emergency department patient became agitated and experienced a seizure for which she was given Ativan and was intubated by emergency department staff.  Patient was hospitalized following seizure, likely secondary to alcohol and benzodiazepine withdrawal.  Also presentation in the emergency department patient was found to have hypokalemia and hypomagnesia.  At presentation patient in new onset atrial fibrillation with rapid ventricular response, therefore given diltiazem bolus and subsequently started on diltiazem drip on 09/09/2020.  Diltiazem drip discontinued on 09/10/2020.  Patient now on Lopressor 50 mg twice daily. Since admission patient's potassium and magnesium have been repleted and are now within normal limits at 3.7 and 1.8 respectively. Since admission patient has been in atrial fibrillation with episodes of rapid ventricular response.   Patient reports she does not remember coming to the hospital or what brought her  here, however she is able to recall yesterday afternoon and earlier today.  Patient states she is feeling fatigued, shortness of breath, and substernal chest pain which she describes as 3/10 severity.  Patient was out of bed earlier this morning but does not recall whether exertion exacerbated symptoms or not.  Past Medical History:  Diagnosis Date  . Alcohol abuse   . Allergy   . Anxiety   . Cataract 06/09/2012   Right eye and left eye  . Depression   . GERD (gastroesophageal reflux disease)   . 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 Seizures ([place])    xanax withdrawl- December 2013  . Urinary incontinence 06/09/2012   Past Surgical History:  Procedure Laterality Date  . APPENDECTOMY    . CATARACT EXTRACTION  06/09/2012   Left eye  . left shoulder dislocation  Sept 2011   Family History  Problem Relation Age of Onset  . Hypertension Mother   . Hyperlipidemia Mother   . Aneurysm Mother        Rupture - Cause of death  . Heart disease Father        MI - cause of death  . Depression Father   . Parkinsonism Father   . Colon cancer Neg Hx   . Esophageal cancer Neg Hx   . Rectal cancer Neg Hx   . Stomach cancer Neg Hx    Social History   Tobacco Use  . Smoking status: Current Every Day Smoker    Packs/day: 1.00    Years: 43.00    Pack years: 43.00    Types: Cigarettes  . Smokeless tobacco: Never Used  Substance Use Topics  .  Alcohol use: Yes    Alcohol/week: 3.0 standard drinks    Types: 3 Cans of beer per week    Comment: 2-3 times    Marital Sttus: Single  ROS  Review of Systems  Constitutional: Positive for malaise/fatigue.  Cardiovascular: Positive for chest pain. Negative for leg swelling, near-syncope, orthopnea, palpitations and syncope.  Respiratory: Positive for shortness of breath.   Neurological: Negative for dizziness.  Psychiatric/Behavioral: Positive for substance abuse.  All other systems reviewed and are  negative.  Objective   Vitals with BMI 09/16/2020 09/16/2020 09/16/2020  Height - - -  Weight - 213 lbs 10 oz -  BMI - 58.0 -  Systolic 998 338 250  Diastolic 61 63 69  Pulse 539 108 111  Some encounter information is confidential and restricted. Go to Review Flowsheets activity to see all data.    Blood pressure 119/61, pulse (!) 122, temperature 98.3 F (36.8 C), temperature source Oral, resp. rate (!) 32, height 5\' 6"  (1.676 m), weight 96.9 kg, SpO2 95 %.    Physical Exam Vitals reviewed.  Constitutional:      General: She is not in acute distress.    Appearance: She is obese.  HENT:     Head: Normocephalic and atraumatic.     Nose: Nose normal.     Mouth/Throat:     Mouth: Mucous membranes are moist.  Eyes:     Extraocular Movements: Extraocular movements intact.     Conjunctiva/sclera: Conjunctivae normal.  Neck:     Vascular: No carotid bruit.  Cardiovascular:     Rate and Rhythm: Tachycardia present. Rhythm irregularly irregular.     Pulses: Intact distal pulses.     Heart sounds: S1 normal and S2 normal. No murmur heard. No gallop.   Pulmonary:     Effort: Pulmonary effort is normal. No respiratory distress.     Breath sounds: Wheezing (bilaterally throughout) present. No rhonchi or rales.  Abdominal:     General: Bowel sounds are normal. There is no distension.     Palpations: Abdomen is soft.  Musculoskeletal:     Cervical back: Normal range of motion and neck supple.     Right lower leg: No edema.     Left lower leg: No edema.  Skin:    General: Skin is warm and dry.  Neurological:     General: No focal deficit present.     Mental Status: She is alert.  Psychiatric:     Comments: Calm and cooperative, appear anxious    Laboratory examination:    Recent Labs    06/20/20 1431 09/09/20 2147 09/14/20 0333 09/15/20 0319 09/16/20 0352  NA 138   < > 136 135 135  K 3.9   < > 4.2 4.0 3.7  CL 100   < > 97* 92* 89*  CO2 25   < > 32 36* 41*  GLUCOSE 97    < > 113* 119* 108*  BUN 4*   < > 6* <5* <5*  CREATININE 0.72   < > 0.52 0.46 0.50  CALCIUM 9.2   < > 8.9 8.7* 8.6*  GFRNONAA 90   < > >60 >60 >60  GFRAA 104  --   --   --   --    < > = values in this interval not displayed.   estimated creatinine clearance is 85.5 mL/min (by C-G formula based on SCr of 0.5 mg/dL).  CMP Latest Ref Rng & Units 09/16/2020 09/15/2020 09/14/2020  Glucose  70 - 99 mg/dL 108(H) 119(H) 113(H)  BUN 8 - 23 mg/dL <5(L) <5(L) 6(L)  Creatinine 0.44 - 1.00 mg/dL 0.50 0.46 0.52  Sodium 135 - 145 mmol/L 135 135 136  Potassium 3.5 - 5.1 mmol/L 3.7 4.0 4.2  Chloride 98 - 111 mmol/L 89(L) 92(L) 97(L)  CO2 22 - 32 mmol/L 41(H) 36(H) 32  Calcium 8.9 - 10.3 mg/dL 8.6(L) 8.7(L) 8.9  Total Protein 6.5 - 8.1 g/dL - - -  Total Bilirubin 0.3 - 1.2 mg/dL - - -  Alkaline Phos 38 - 126 U/L - - -  AST 15 - 41 U/L - - -  ALT 0 - 44 U/L - - -   CBC Latest Ref Rng & Units 09/16/2020 09/15/2020 09/14/2020  WBC 4.0 - 10.5 K/uL 8.0 8.9 7.6  Hemoglobin 12.0 - 15.0 g/dL 11.8(L) 12.1 11.7(L)  Hematocrit 36.0 - 46.0 % 37.1 37.9 36.6  Platelets 150 - 400 K/uL 216 204 155   Lipid Panel No results for input(s): CHOL, TRIG, LDLCALC, VLDL, HDL, CHOLHDL, LDLDIRECT in the last 8760 hours.  HEMOGLOBIN A1C Lab Results  Component Value Date   HGBA1C 5.3 06/20/2020   TSH Recent Labs    09/12/20 0949  TSH 0.827   BNP (last 3 results) Recent Labs    09/15/20 1624  BNP 684.3*   Results for orders placed or performed during the hospital encounter of 09/09/20 (from the past 48 hour(s))  Glucose, capillary     Status: Abnormal   Collection Time: 09/14/20  7:57 PM  Result Value Ref Range   Glucose-Capillary 121 (H) 70 - 99 mg/dL    Comment: Glucose reference range applies only to samples taken after fasting for at least 8 hours.  Glucose, capillary     Status: Abnormal   Collection Time: 09/14/20 11:50 PM  Result Value Ref Range   Glucose-Capillary 110 (H) 70 - 99 mg/dL    Comment: Glucose  reference range applies only to samples taken after fasting for at least 8 hours.  Glucose, capillary     Status: Abnormal   Collection Time: 09/15/20  3:06 AM  Result Value Ref Range   Glucose-Capillary 43 (LL) 70 - 99 mg/dL    Comment: Glucose reference range applies only to samples taken after fasting for at least 8 hours.   Comment 1 Repeat Test   Glucose, capillary     Status: None   Collection Time: 09/15/20  3:07 AM  Result Value Ref Range   Glucose-Capillary 71 70 - 99 mg/dL    Comment: Glucose reference range applies only to samples taken after fasting for at least 8 hours.  Glucose, capillary     Status: None   Collection Time: 09/15/20  3:08 AM  Result Value Ref Range   Glucose-Capillary 98 70 - 99 mg/dL    Comment: Glucose reference range applies only to samples taken after fasting for at least 8 hours.  Magnesium     Status: Abnormal   Collection Time: 09/15/20  3:19 AM  Result Value Ref Range   Magnesium 1.6 (L) 1.7 - 2.4 mg/dL    Comment: Performed at [facility] [address] [place], [place] [address]  Phosphorus     Status: None   Collection Time: 09/15/20  3:19 AM  Result Value Ref Range   Phosphorus 3.1 2.5 - 4.6 mg/dL    Comment: Performed at [facility] [address] [place], [place] [address]  CBC  Status: Abnormal   Collection Time: 09/15/20  3:19 AM  Result Value Ref Range   WBC 8.9 4.0 - 10.5 K/uL   RBC 3.67 (L) 3.87 - 5.11 MIL/uL   Hemoglobin 12.1 12.0 - 15.0 g/dL   HCT 37.9 36.0 - 46.0 %   MCV 103.3 (H) 80.0 - 100.0 fL   MCH 33.0 26.0 - 34.0 pg   MCHC 31.9 30.0 - 36.0 g/dL   RDW 13.0 11.5 - 15.5 %   Platelets 204 150 - 400 K/uL   nRBC 0.0 0.0 - 0.2 %    Comment: Performed at [facility], [place] [address] [place], [place] [address]  Basic metabolic panel     Status: Abnormal   Collection Time: 09/15/20  3:19 AM  Result Value Ref Range   Sodium 135 135 - 145 mmol/L   Potassium 4.0 3.5 - 5.1 mmol/L   Chloride 92 (L)  98 - 111 mmol/L   CO2 36 (H) 22 - 32 mmol/L   Glucose, Bld 119 (H) 70 - 99 mg/dL    Comment: Glucose reference range applies only to samples taken after fasting for at least 8 hours.   BUN <5 (L) 8 - 23 mg/dL   Creatinine, Ser 0.46 0.44 - 1.00 mg/dL   Calcium 8.7 (L) 8.9 - 10.3 mg/dL   GFR, Estimated >60 >60 mL/min    Comment: (NOTE) Calculated using the CKD-EPI Creatinine Equation (2021)    Anion gap 7 5 - 15    Comment: Performed at [facility] [address], [place], [place] [address]  Glucose, capillary     Status: Abnormal   Collection Time: 09/15/20  7:18 AM  Result Value Ref Range   Glucose-Capillary 138 (H) 70 - 99 mg/dL    Comment: Glucose reference range applies only to samples taken after fasting for at least 8 hours.  Glucose, capillary     Status: Abnormal   Collection Time: 09/15/20 11:44 AM  Result Value Ref Range   Glucose-Capillary 152 (H) 70 - 99 mg/dL    Comment: Glucose reference range applies only to samples taken after fasting for at least 8 hours.  Glucose, capillary     Status: Abnormal   Collection Time: 09/15/20  3:20 PM  Result Value Ref Range   Glucose-Capillary 136 (H) 70 - 99 mg/dL    Comment: Glucose reference range applies only to samples taken after fasting for at least 8 hours.  Brain natriuretic peptide     Status: Abnormal   Collection Time: 09/15/20  4:24 PM  Result Value Ref Range   B Natriuretic Peptide 684.3 (H) 0.0 - 100.0 pg/mL    Comment: Performed at [facility] [address], [place], [place] [address]  Procalcitonin - Baseline     Status: None   Collection Time: 09/15/20  4:24 PM  Result Value Ref Range   Procalcitonin <0.10 ng/mL    Comment:        Interpretation: PCT (Procalcitonin) <= 0.5 ng/mL: Systemic infection (sepsis) is not likely. Local bacterial infection is possible. (NOTE)       Sepsis PCT Algorithm           Lower Respiratory Tract                                      Infection PCT Algorithm     ----------------------------     ----------------------------  PCT < 0.25 ng/mL                PCT < 0.10 ng/mL          Strongly encourage             Strongly discourage   discontinuation of antibiotics    initiation of antibiotics    ----------------------------     -----------------------------       PCT 0.25 - 0.50 ng/mL            PCT 0.10 - 0.25 ng/mL               OR       >80% decrease in PCT            Discourage initiation of                                            antibiotics      Encourage discontinuation           of antibiotics    ----------------------------     -----------------------------         PCT >= 0.50 ng/mL              PCT 0.26 - 0.50 ng/mL               AND        <80% decrease in PCT             Encourage initiation of                                             antibiotics       Encourage continuation           of antibiotics    ----------------------------     -----------------------------        PCT >= 0.50 ng/mL                  PCT > 0.50 ng/mL               AND         increase in PCT                  Strongly encourage                                      initiation of antibiotics    Strongly encourage escalation           of antibiotics                                     -----------------------------                                           PCT <= 0.25 ng/mL  OR                                        > 80% decrease in PCT                                      Discontinue / Do not initiate                                             antibiotics  Performed at [facility], [place] [address] [place], [place] [address]   Glucose, capillary     Status: Abnormal   Collection Time: 09/15/20  7:22 PM  Result Value Ref Range   Glucose-Capillary 118 (H) 70 - 99 mg/dL    Comment: Glucose reference range applies only to samples taken after fasting for at least 8 hours.  Glucose, capillary      Status: Abnormal   Collection Time: 09/15/20 11:18 PM  Result Value Ref Range   Glucose-Capillary 150 (H) 70 - 99 mg/dL    Comment: Glucose reference range applies only to samples taken after fasting for at least 8 hours.  Glucose, capillary     Status: Abnormal   Collection Time: 09/16/20  3:14 AM  Result Value Ref Range   Glucose-Capillary 125 (H) 70 - 99 mg/dL    Comment: Glucose reference range applies only to samples taken after fasting for at least 8 hours.  CBC     Status: Abnormal   Collection Time: 09/16/20  3:52 AM  Result Value Ref Range   WBC 8.0 4.0 - 10.5 K/uL   RBC 3.57 (L) 3.87 - 5.11 MIL/uL   Hemoglobin 11.8 (L) 12.0 - 15.0 g/dL   HCT 37.1 36.0 - 46.0 %   MCV 103.9 (H) 80.0 - 100.0 fL   MCH 33.1 26.0 - 34.0 pg   MCHC 31.8 30.0 - 36.0 g/dL   RDW 12.8 11.5 - 15.5 %   Platelets 216 150 - 400 K/uL   nRBC 0.0 0.0 - 0.2 %    Comment: Performed at [facility], [place] [address] [place], [place] [address]  Basic metabolic panel     Status: Abnormal   Collection Time: 09/16/20  3:52 AM  Result Value Ref Range   Sodium 135 135 - 145 mmol/L   Potassium 3.7 3.5 - 5.1 mmol/L   Chloride 89 (L) 98 - 111 mmol/L   CO2 41 (H) 22 - 32 mmol/L   Glucose, Bld 108 (H) 70 - 99 mg/dL    Comment: Glucose reference range applies only to samples taken after fasting for at least 8 hours.   BUN <5 (L) 8 - 23 mg/dL   Creatinine, Ser 0.50 0.44 - 1.00 mg/dL   Calcium 8.6 (L) 8.9 - 10.3 mg/dL   GFR, Estimated >60 >60 mL/min    Comment: (NOTE) Calculated using the CKD-EPI Creatinine Equation (2021)    Anion gap 5 5 - 15    Comment: Performed at [facility] [address], [place], [place] [address]  Magnesium     Status: None   Collection Time: 09/16/20  3:52 AM  Result Value Ref Range   Magnesium  1.8 1.7 - 2.4 mg/dL    Comment: Performed at [facility], [place] [address], [place], [place] [address]  Glucose, capillary     Status: Abnormal   Collection Time: 09/16/20   7:10 AM  Result Value Ref Range   Glucose-Capillary 110 (H) 70 - 99 mg/dL    Comment: Glucose reference range applies only to samples taken after fasting for at least 8 hours.  Glucose, capillary     Status: Abnormal   Collection Time: 09/16/20 11:23 AM  Result Value Ref Range   Glucose-Capillary 121 (H) 70 - 99 mg/dL    Comment: Glucose reference range applies only to samples taken after fasting for at least 8 hours.  Glucose, capillary     Status: Abnormal   Collection Time: 09/16/20  3:03 PM  Result Value Ref Range   Glucose-Capillary 151 (H) 70 - 99 mg/dL    Comment: Glucose reference range applies only to samples taken after fasting for at least 8 hours.    Medications and allergies  No Known Allergies   Current Meds  Medication Sig  . alprazolam (XANAX) 2 MG tablet Take 2 mg by mouth 3 (three) times daily as needed.  Marland Kitchen amLODipine (NORVASC) 10 MG tablet Take 10 mg by mouth daily.  . ARIPiprazole (ABILIFY) 5 MG tablet Take 5 mg by mouth daily.  Marland Kitchen azelastine (OPTIVAR) 0.05 % ophthalmic solution Place 1 drop into both eyes 2 (two) times daily.  . citalopram (CELEXA) 40 MG tablet Take 1 tablet (40 mg total) by mouth daily.  . DULoxetine (CYMBALTA) 60 MG capsule Take 60 mg by mouth daily.  Marland Kitchen esomeprazole (NEXIUM) 40 MG capsule Take 1 capsule (40 mg total) by mouth daily at 12 noon.  . fluticasone (FLONASE) 50 MCG/ACT nasal spray SHAKE LIQUID AND USE 2 SPRAYS IN EACH NOSTRIL DAILY (Patient taking differently: Place 2 sprays into both nostrils daily as needed for allergies or rhinitis.)  . gabapentin (NEURONTIN) 600 MG tablet Take 2 tablets (1,200 mg total) by mouth 3 (three) times daily.  . Multiple Vitamins-Minerals (CENTRUM SILVER 50+WOMEN PO) Take 1 tablet by mouth daily.  . polyvinyl alcohol (LIQUITEARS) 1.4 % ophthalmic solution Place 3 drops into both eyes as needed for dry eyes.  . Probiotic Product (PROBIOTIC PO) Take 1 capsule by mouth daily.  . QUEtiapine Fumarate (SEROQUEL  XR) 150 MG 24 hr tablet Take 150 mg by mouth at bedtime.  Marland Kitchen tiZANidine (ZANAFLEX) 4 MG tablet Take 4 mg by mouth 3 (three) times daily.    Scheduled Meds: . ALPRAZolam  2 mg Oral BID  . ARIPiprazole  5 mg Oral Daily  . chlorhexidine  15 mL Mouth Rinse BID  . Chlorhexidine Gluconate Cloth  6 each Topical Daily  . citalopram  40 mg Oral Daily  . enoxaparin (LOVENOX) injection  1 mg/kg Subcutaneous Q12H  . feeding supplement  237 mL Oral TID BM  . folic acid  1 mg Oral Daily  . gabapentin  1,200 mg Oral TID  . Gerhardt's butt cream   Topical QID  . ipratropium-albuterol  3 mL Nebulization TID  . mouth rinse  15 mL Mouth Rinse q12n4p  . metoprolol tartrate  50 mg Oral BID  . multivitamin with minerals  1 tablet Oral Daily  . nicotine  7 mg Transdermal Daily  . pantoprazole sodium  40 mg Oral QHS  . [START ON 09/17/2020] polyethylene glycol  17 g Oral Daily  . QUEtiapine  150 mg Oral QHS  .  sodium chloride flush  10-40 mL Intracatheter Q12H  . thiamine  100 mg Oral Daily   Continuous Infusions: . sodium chloride Stopped (09/16/20 1555)  . diltiazem (CARDIZEM) infusion     PRN Meds:.acetaminophen, albuterol, docusate, LORazepam, metoprolol tartrate, ondansetron (ZOFRAN) IV, Resource ThickenUp Clear, sodium chloride flush   I/O last 3 completed shifts: In: 2303 [P.O.:270; I.V.:2033] Out: 2400 [Urine:2400] Total I/O In: 21.9 [IV Piggyback:21.9] Out: 1500 [Urine:1500]    Radiology:   DG CHEST PORT 1 VIEW  Result Date: 09/16/2020 CLINICAL DATA:  Wheezing. Shortness of breath. Altered mental status. EXAM: PORTABLE CHEST 1 VIEW COMPARISON:  09/15/2020. FINDINGS: Right PICC line noted in stable position. Cardiomegaly. Diffuse bilateral pulmonary infiltrates/edema in small bilateral pleural effusions again noted without interim change. No change thoracic spine. IMPRESSION: 1.  PICC line stable position. 2.  Cardiomegaly. 3. Diffuse bilateral pulmonary infiltrates/edema and small bilateral  pleural effusions again noted. No interim change. Electronically Signed   By: Marcello Moores  Register   On: 09/16/2020 12:11   DG CHEST PORT 1 VIEW  Result Date: 09/15/2020 CLINICAL DATA:  Wheezing EXAM: PORTABLE CHEST 1 VIEW COMPARISON:  Portable exam 1303 hours compared to 09/11/2020 FINDINGS: RIGHT arm PICC line tip projects over SVC. Interval removal of nasogastric and endotracheal tubes. Enlargement of cardiac silhouette with pulmonary vascular congestion. Bibasilar opacities question atelectasis and/or infiltrate, slightly increased. Minimal RIGHT pleural effusion. No pneumothorax. IMPRESSION: Slightly increased bibasilar opacities question atelectasis and/or infiltrate. Electronically Signed   By: Lavonia Dana M.D.   On: 09/15/2020 15:05   DG Swallowing Func-Speech Pathology  Result Date: 09/16/2020 Objective Swallowing Evaluation: Type of Study: MBS-Modified Barium Swallow Study  Patient Details Name: Karen Casey MRN: [id number] Date of Birth: 11-14-1955 Today's Date: 09/16/2020 Time: SLP Start Time (ACUTE ONLY): 1310 -SLP Stop Time (ACUTE ONLY): 1330 SLP Time Calculation (min) (ACUTE ONLY): 20 min Past Medical History: Past Medical History: Diagnosis Date . Alcohol abuse  . Allergy  . Anxiety  . Cataract 06/09/2012  Right eye and left eye . Depression  . GERD (gastroesophageal reflux disease)  . 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  Event occurred in 2007 . Seizures ([place])   xanax withdrawl- December 2013 . Urinary incontinence 06/09/2012 Past Surgical History: Past Surgical History: Procedure Laterality Date . APPENDECTOMY   . CATARACT EXTRACTION  06/09/2012  Left eye . left shoulder dislocation  Sept 2011 HPI: Pt is a 65 year old female with PMHx significant for depression/anxiety (chronic Xanax use with history of seizure 2/2 withdrawal 2013), EtOH abuse (drinks estimated 6-10 beers/day) and tobacco abuse who [facility] ED on 3/29 after being found altered at home. On admission pt's family reported periods  of cyclical vomiting that last up to a week. Pt had witnessed seizure in CT (non-focal shaking movements, 3-5 minutes long per CT staff). Pt received a total of 4mg  Ativan and, upon return to ED was minimally responsive (likely post-ictal) and not protecting her airway. ETT 3/29-4/1 [phone]). CXR 3/31: Progressive left basilar consolidation. Progressive pulmonary hypoinflation; BSE completed on 09/12/20 with PO readiness goals initiated.  Subjective: Pt seen in radiology for MBS to determine least restrictive diet Assessment / Plan / Recommendation CHL IP CLINICAL IMPRESSIONS 09/16/2020 Clinical Impression Pt presents with mild oropharyngeal dysphagia, characterized as follows: Oral: Pt has poor and missing dentition, which impairs her ability to masticate solids efficiently. This results in extended oral prep, which could increase aspiration risk with increased fatigue. No anterior leakage was noted, and no pocketing was seen. Pharyngeal: Pt exhibits  swallow reflex at the level of the pyriform sinus on thin and nectar thick liquids, and at the vallecular sinus on puree and solid textures. There is no post swallow residue following any consistency. Pt exhibits intermittent flash penetration of thin and nectar thick liquids during the swallow. Straw use was noted to result in more significant and deeper penetration. Pt was observed to be impulsive with liquids, taking a very large bolus orally when instructed to take a very small sip. Small sips via cup did not result in penetration. Recommend dys 2 (finely chopped) solids with thin liquids via CUP ONLY - no straws. Pt will need 1:1 supervision to manage bolus size and rate to maximize swallow safety. Medications should be given in puree. Large pills crushed. Safe swallow precautions were reviewed with pt and RN after this study, and sent with transport back to pt's room.  SLP will follow for education and to assess tolerance of advanced textures.  SLP Visit Diagnosis  Dysphagia, oropharyngeal phase (R13.12)     Impact on safety and function Moderate aspiration risk   CHL IP TREATMENT RECOMMENDATION 09/16/2020 Treatment Recommendations Therapy as outlined in treatment plan below   Prognosis 09/16/2020 Prognosis for Safe Diet Advancement Good Barriers to Reach Goals Severity of deficits;Cognitive deficits   CHL IP DIET RECOMMENDATION 09/16/2020 SLP Diet Recommendations Dysphagia 2 (Fine chop) solids;Thin liquid Liquid Administration via Cup only No straws  Medication Administration Whole meds with puree, crush large pills  Compensations Slow rate Small sips/bites Minimize environmental distractions  Postural Changes Seated upright at 90 degrees Remain semi-upright after after feeds/meals    CHL IP OTHER RECOMMENDATIONS 09/16/2020   Oral Care Recommendations Oral care BID       CHL IP FREQUENCY AND DURATION 09/16/2020 Speech Therapy Frequency (ACUTE ONLY) min 2x/week Treatment Duration 2 weeks      CHL IP ORAL PHASE 09/16/2020 Oral Phase Impaired   Oral - Regular Impaired mastication, likely due to poor dentition    CHL IP PHARYNGEAL PHASE 09/16/2020 Pharyngeal Phase Impaired   Pharyngeal- Nectar Cup Delayed swallow initiation-pyriform sinuses;Penetration/Aspiration during swallow;Reduced airway/laryngeal closure  Pharyngeal Material does not enter airway;Material enters airway, remains ABOVE vocal cords then ejected out   Pharyngeal- Thin Cup, Straw Delayed swallow initiation-pyriform sinuses;Penetration/Aspiration during swallow;Reduced airway/laryngeal closure Penetration was more significant and deeper with straw sips  Pharyngeal Material does not enter airway;Material enters airway, remains ABOVE vocal cords then ejected out No penetration seen during small individual sips.  Pharyngeal- Thin Straw Delayed swallow initiation-pyriform sinuses;Penetration/Aspiration during swallow  Pharyngeal Material enters airway, remains ABOVE vocal cords then ejected out  Pharyngeal- Puree Delayed swallow  initiation-vallecula  Pharyngeal- Regular Delayed swallow initiation-vallecula   CHL IP CERVICAL ESOPHAGEAL PHASE 09/16/2020 Cervical Esophageal Phase [name] B. Quentin O[facility] [place] Speech Language Pathologist Office: [phone] Shonna Chock 09/16/2020, 2:20 PM               Cardiac Studies:   Echocardiogram 09/10/2020: 1. Left ventricular ejection fraction, by estimation, is 60 to 65%. The  left ventricle has normal function. The left ventricle has no regional  wall motion abnormalities. There is mild left ventricular hypertrophy.  Left ventricular diastolic parameters  were normal.  2. Right ventricular systolic function is normal. The right ventricular  size is normal. There is normal pulmonary artery systolic pressure.  3. The mitral valve is normal in structure. Trivial mitral valve  regurgitation. No evidence of mitral stenosis.  4. The aortic valve is tricuspid. Aortic valve regurgitation is  not  visualized. No aortic stenosis is present.  5. The inferior vena cava is normal in size with greater than 50%  respiratory variability, suggesting right atrial pressure of 3 mmHg.   Telemetry:  Atrial fibrillation with rapid ventricular response  EKG: 09/15/2020: Atrial fibrillation with rapid ventricular response at a rate of 119 bpm.  Normal axis.  09/09/2020: Atrial fibrillation with rapid ventricular response at a rate of 156 bpm.  Normal axis.  Assessment   1.  Paroxysmal atrial fibrillation with rapid ventricular response CHA2DS2-VASc Score 1 (Female) and yearly risk of stroke 0.6%.   2.  Chest pain  3.  Shortness of breath  Recommendations:   EARLEAN FIDALGO  is a 65 y.o. Caucasian female with history of anxiety and depression with chronic Xanax use, alcohol abuse, tobacco dependence with 43-pack-year history.  Patient denies history of hyperlipidemia, MI, CVA/TIA, DVT/PE, diabetes.  1.  Paroxysmal atrial fibrillation with rapid ventricular response Patient  has been in atrial fibrillation with episodes of rapid ventricular response for approximately the last 5 to 6 days.  Echocardiogram reveals LVEF of 60 to 65% and blood pressure is mildly elevated.  We will therefore restart patient on diltiazem drip for rate control.  Patient's current CHA2DS2-VASc score is 1, however she may have additional cardiovascular risk factors.  As she has been in atrial fibrillation for the last several days and she is also presently complaining of chest pain which she describes as palpitations.  Discussed with patient regarding risks and benefits of anticoagulation, she verbalized understanding of risk and wishes to proceed with anticoagulation.  Patient herself denies history of significant bleeding, and chart review reveals no contraindications to anticoagulation at this time.   Prior to discharge the shared decision will need to be made with regards to her oral anticoagulation management versus aspirin use given her CHA2DS2-VASc score..  To further stratify her thromboembolic risk recommend checking hemoglobin A1c.  2.  Chest pain Patient reports chest pain likely starting earlier today.  Suspect this may be rate related, however agree with primary team's order of serial troponins.  Although suspicion is low for ACS, will monitor troponins closely.  In order to also better risk stratify patient will obtain hemoglobin A1c and lipid profile testing to further evaluate for cardiovascular risk factors.  3.  Shortness of breath Suspect shortness of breath may also be related to rapid ventricular response and atrial fibrillation.  We will continue to monitor closely.   Patient was seen in collaboration with Dr. Terri Skains. He also reviewed patient's chart and Dr. Terri Skains is in agreement of the plan.     Alethia Berthold, PA-C 09/16/2020, 6:37 PM Office: [phone]  ADDENDUM: Independently seen and examined the patient at bedside at approximately 6:30 PM. I reviewed the above  and agree the findings and recommendations, unless noted differently below.   Patient presents to the hospital secondary to altered mental status in the setting of excessive alcohol use history and use of Xanax.  Patient was noted to have seizures while she was in the ED and was intubated shortly thereafter for airway management.  Patient is noted to have new onset of atrial fibrillation during his hospitalization.  Initially thought to be secondary to withdrawal from benzodiazepine and EtOH use and she has had multiple electrolyte abnormalities as well.  She continues to be in atrial fibrillation therefore cardiology was consulted today for further evaluation and management.  Recent EKGs available she was last noted to be in sinus rhythm  on 09/09/2020.  Patient states that she has never had atrial fibrillation in the past and this is newly diagnosed. No prior history of intracranial bleed, gastrointestinal bleeding, or the need for packed red blood cells.  Patient states that she has chest discomfort.  She is not complaining of chest pain specifically but more of palpitations due to rapid ventricular rate.  Primary team has ordered serial troponins to rule out ACS which is very appropriate clinical setting.   PHYSICAL EXAM: Vitals with BMI 09/16/2020 09/16/2020 09/16/2020  Height - - -  Weight - 213 lbs 10 oz -  BMI - 11.9 -  Systolic 417 408 144  Diastolic 61 63 69  Pulse 818 108 111  Some encounter information is confidential and restricted. Go to Review Flowsheets activity to see all data.   CONSTITUTIONAL: Appears older than stated age, hemodynamically stable, no acute distress   SKIN: Skin is warm and dry. No rash noted. No cyanosis. No pallor. No jaundice HEAD: Normocephalic and atraumatic.  EYES: No scleral icterus MOUTH/THROAT: Moist oral membranes.  NECK: No JVD present. No thyromegaly noted. No carotid bruits  LYMPHATIC: No visible cervical adenopathy.  CHEST Normal respiratory  effort. No intercostal retractions  LUNGS: Bilateral expiratory wheezes noted.   CARDIOVASCULAR: Irregularly irregular, variable S1-S2 no murmurs rubs or gallops appreciated secondary to tachycardia tachycardia ABDOMINAL: No apparent ascites.  EXTREMITIES: No peripheral edema  HEMATOLOGIC: No significant bruising NEUROLOGIC: Oriented to person, place, and time. Nonfocal. Normal muscle tone.  PSYCHIATRIC: Normal mood and affect. Normal behavior. Cooperative    Assessment/Plan: Atrial fibrillation with rapid ventricular rate: Rate control: Currently on Lopressor.  We will start diltiazem drip. Rhythm control: N/A Thromboembolic prophylaxis: We will start IV heparin drip as the patient has been atrial fibrillation since 09/09/2020.  However, will have to discuss with patient's next of kin with regards to oral anticoagulation at the time of discharge as her CHA2DS2-VASc score is 1 and shared decision is essential given her score and excessive alcohol use which predispose her to bleeding. For now patient is agreeable with initiation of anticoagulation.  We will start IV heparin pharmacy to dose.  Patient's nurse asked to be more vigilant with regards to any evidence of bleeding. TSH within normal limits. Recommend checking hemoglobin A1c to screen for diabetes Electrolyte replacements per primary team.  Goal potassium at 4 magnesium 2.  Shortness of breath: Most likely secondary to A. fib with RVR Echocardiogram notes preserved LVEF without any significant valvular heart disease. Start Lasix 20 mg IV push daily Consider inhalers as she may also have undiagnosed COPD give her nicotine exposure. Will defer to primary team. Strict I's and O's and daily weights Continue to monitor  Precordial pain: Less likely cardiac as the patient states that she feels her heart beat fast. Check serial troponins. If patient rules in for ACS will discuss ischemic evaluation prior to discharge. Continue to  monitor.  Active tobacco use: Educated on importance of complete cessation of smoking.  Currently on nicotine patches.  Further recommendations to follow as the case evolves.   This note was created using a voice recognition software as a result there may be grammatical errors inadvertently enclosed that do not reflect the nature of this encounter. Every attempt is made to correct such errors.   Total encounter time 87 minutes.  *Total Encounter Time as defined by the Centers for Medicare and Medicaid Services includes, in addition to the face-to-face time of a patient visit (documented in the  note above) non-face-to-face time: obtaining and reviewing outside history, ordering and reviewing medications, tests or procedures, care coordination (communications with other health care professionals or caregivers) and documentation in the medical record.   Rex Kras, [place], [facility]  Pager: [phone] Office: [phone]

## 2020-09-16 NOTE — Progress Notes (Addendum)
ANTICOAGULATION CONSULT NOTE - Follow Up Consult  Pharmacy Consult for Lovenox >> IV Heparin Indication: atrial fibrillation  No Known Allergies  Patient Measurements: Height: 5\' 6"  (167.6 cm) Weight: 96.9 kg (213 lb 10 oz) IBW/kg (Calculated) : 59.3  Heparin Dosing Weight: 81 kg  Vital Signs: Temp: 98.3 F (36.8 C) (04/05 1510) Temp Source: Oral (04/05 1510)  Labs: Recent Labs    09/14/20 0333 09/15/20 0319 09/16/20 0352  HGB 11.7* 12.1 11.8*  HCT 36.6 37.9 37.1  PLT 155 204 216  CREATININE 0.52 0.46 0.50    Estimated Creatinine Clearance: 85.5 mL/min (by C-G formula based on SCr of 0.5 mg/dL).   Medical History: Past Medical History:  Diagnosis Date  . Alcohol abuse   . Allergy   . Anxiety   . Cataract 06/09/2012   Right eye and left eye  . Depression   . GERD (gastroesophageal reflux disease)   . 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 Seizures ([place])    xanax withdrawl- December 2013  . Urinary incontinence 06/09/2012   Assessment: 65 yr old woman presented to [facility] ED via EMS for AMS at home. Patient developed atrial fibrillation on 09/12/20. Pharmacy was consulted to start Lovenox dosing for atrial fibrillation (Lovenox 97.5 mg SQ Q 12 hrs; last dose at 1147 AM today). Pharmacy is now consulted to dose IV heparin (pt with a fib and chest pain).  H/H 11.8/37.1, plt 216. Per RN, no bleeding observed.   Goal of Therapy:  Heparin level: 0.3-0.7 units/ml Monitor platelets by anticoagulation protocol: Yes   Plan:  Discontinue Lovenox 97.5 mg SQ Q 12 hrs Per [place] anticoagulation protocol, start heparin infusion (no bolus) at 1100 units/hr ~4 hrs before next dose of Lovenox is due (next dose would be due at 2345 PM, start heparin infusion at 1945 PM tonight) Check heparin level in 6 hrs Monitor daily heparin level, CBC Monitor for signs and symptoms of bleeding   Gillermina Hu, PharmD, BCPS, [facility] Clinical  Pharmcist 09/16/2020 6:38 PM

## 2020-09-16 NOTE — Progress Notes (Signed)
Physical Therapy Treatment Patient Details Name: Karen Casey MRN: [id number] DOB: 03/27/1956 Today's Date: 09/16/2020    History of Present Illness 65 yo admitted 3/29 after found with AMS and covered in feces at home. Pt was taken for head CT during which she had a Sz and required intubation 3/29. Extubated 4/1. PMhx: ETOH abuse, depression/anxiety, GERD, IBS, HTN, urinary incontinence    PT Comments    Pt making slow progress with mobility. Will need assist at home with mobility. Pt with high HR (154) with activity.    Follow Up Recommendations  Home health PT;Supervision for mobility/OOB     Equipment Recommendations  Rolling walker with 5" wheels;3in1 (PT)    Recommendations for Other Services       Precautions / Restrictions Precautions Precautions: Fall Precaution Comments: watch sats    Mobility  Bed Mobility Overal bed mobility: Needs Assistance Bed Mobility: Supine to Sit;Sit to Supine     Supine to sit: Min assist;HOB elevated Sit to supine: Min assist   General bed mobility comments: Assist to elevate trunk into sitting and to bring legs back into bed returning to supine    Transfers Overall transfer level: Needs assistance Equipment used: Rolling walker (2 wheeled) Transfers: Sit to/from Stand Sit to Stand: Min assist         General transfer comment: Assist for balance and to bring hips up.  Ambulation/Gait Ambulation/Gait assistance: Min assist;+2 safety/equipment Gait Distance (Feet): 5 Feet Assistive device: Rolling walker (2 wheeled) Gait Pattern/deviations: Step-to pattern;Decreased step length - right;Decreased step length - left Gait velocity: decr Gait velocity interpretation: <1.31 ft/sec, indicative of household ambulator General Gait Details: side step up the side of the bed   Stairs             Wheelchair Mobility    Modified Rankin (Stroke Patients Only)       Balance Overall balance assessment: History of  Falls;Needs assistance Sitting-balance support: No upper extremity supported;Feet supported Sitting balance-Leahy Scale: Fair     Standing balance support: Bilateral upper extremity supported Standing balance-Leahy Scale: Poor Standing balance comment: walker and min guard for static standing                            Cognition Arousal/Alertness: Awake/alert Behavior During Therapy: Impulsive Overall Cognitive Status: Impaired/Different from baseline Area of Impairment: Memory;Safety/judgement;Problem solving;Awareness                     Memory: Decreased short-term memory   Safety/Judgement: Decreased awareness of safety;Decreased awareness of deficits Awareness: Emergent Problem Solving: Requires verbal cues;Requires tactile cues        Exercises      General Comments General comments (skin integrity, edema, etc.): Pt on O2 and with poor SpO2 reception. HR to 154 with activity. BP 140's/70's in sitting      Pertinent Vitals/Pain Pain Assessment: Faces Faces Pain Scale: Hurts even more Pain Location: lower back Pain Descriptors / Indicators: Aching Pain Intervention(s): Limited activity within patient's tolerance;Repositioned    Home Living                      Prior Function            PT Goals (current goals can now be found in the care plan section) Acute Rehab PT Goals Patient Stated Goal: return home and read Progress towards PT goals: Progressing toward goals    Frequency  Min 3X/week      PT Plan Current plan remains appropriate    Co-evaluation              AM-PAC PT "6 Clicks" Mobility   Outcome Measure  Help needed turning from your back to your side while in a flat bed without using bedrails?: A Little Help needed moving from lying on your back to sitting on the side of a flat bed without using bedrails?: A Little Help needed moving to and from a bed to a chair (including a wheelchair)?: A  Little Help needed standing up from a chair using your arms (e.g., wheelchair or bedside chair)?: A Little Help needed to walk in hospital room?: A Little Help needed climbing 3-5 steps with a railing? : Total 6 Click Score: 16    End of Session Equipment Utilized During Treatment: Gait belt;Oxygen Activity Tolerance: Patient tolerated treatment well Patient left: with call bell/phone within reach;in bed;with bed alarm set Nurse Communication: Mobility status PT Visit Diagnosis: Other abnormalities of gait and mobility (R26.89);Difficulty in walking, not elsewhere classified (R26.2)     Time: 8832-5498 PT Time Calculation (min) (ACUTE ONLY): 30 min  Charges:  $Therapeutic Activity: 23-37 mins                     [place] Pager [phone] Office [place] 09/16/2020, 11:36 AM

## 2020-09-17 DIAGNOSIS — F10231 Alcohol dependence with withdrawal delirium: Secondary | ICD-10-CM | POA: Diagnosis not present

## 2020-09-17 DIAGNOSIS — J9601 Acute respiratory failure with hypoxia: Secondary | ICD-10-CM | POA: Diagnosis not present

## 2020-09-17 DIAGNOSIS — R569 Unspecified convulsions: Secondary | ICD-10-CM | POA: Diagnosis not present

## 2020-09-17 DIAGNOSIS — I4819 Other persistent atrial fibrillation: Secondary | ICD-10-CM | POA: Diagnosis present

## 2020-09-17 DIAGNOSIS — E46 Unspecified protein-calorie malnutrition: Secondary | ICD-10-CM | POA: Diagnosis present

## 2020-09-17 LAB — COMPREHENSIVE METABOLIC PANEL
ALT: 20 U/L (ref 0–44)
AST: 19 U/L (ref 15–41)
Albumin: 2.4 g/dL — ABNORMAL LOW (ref 3.5–5.0)
Alkaline Phosphatase: 74 U/L (ref 38–126)
Anion gap: 5 (ref 5–15)
BUN: 5 mg/dL — ABNORMAL LOW (ref 8–23)
CO2: 38 mmol/L — ABNORMAL HIGH (ref 22–32)
Calcium: 8.9 mg/dL (ref 8.9–10.3)
Chloride: 93 mmol/L — ABNORMAL LOW (ref 98–111)
Creatinine, Ser: 0.44 mg/dL (ref 0.44–1.00)
GFR, Estimated: >60 mL/min (ref >60)
Glucose, Bld: 110 mg/dL — ABNORMAL HIGH (ref 70–99)
Potassium: 4 mmol/L (ref 3.5–5.1)
Sodium: 136 mmol/L (ref 135–145)
Total Bilirubin: 0.4 mg/dL (ref 0.3–1.2)
Total Protein: 5.7 g/dL — ABNORMAL LOW (ref 6.5–8.1)

## 2020-09-17 LAB — CBC
HCT: 38.1 % (ref 36.0–46.0)
Hemoglobin: 12.1 g/dL (ref 12.0–15.0)
MCH: 32.9 pg (ref 26.0–34.0)
MCHC: 31.8 g/dL (ref 30.0–36.0)
MCV: 103.5 fL — ABNORMAL HIGH (ref 80.0–100.0)
Platelets: 236 10*3/uL (ref 150–400)
RBC: 3.68 MIL/uL — ABNORMAL LOW (ref 3.87–5.11)
RDW: 12.9 % (ref 11.5–15.5)
WBC: 9.6 10*3/uL (ref 4.0–10.5)
nRBC: 0 % (ref 0.0–0.2)

## 2020-09-17 LAB — HEPARIN LEVEL (UNFRACTIONATED)
Heparin Unfractionated: 0.59 IU/mL (ref 0.30–0.70)
Heparin Unfractionated: 0.24 IU/mL — ABNORMAL LOW (ref 0.30–0.70)

## 2020-09-17 LAB — HEMOGLOBIN A1C
Hgb A1c MFr Bld: 5.2 % (ref 4.8–5.6)
Mean Plasma Glucose: 102.54 mg/dL

## 2020-09-17 LAB — PHOSPHORUS: Phosphorus: 2.9 mg/dL (ref 2.5–4.6)

## 2020-09-17 LAB — LIPID PANEL
Cholesterol: 97 mg/dL (ref 0–200)
HDL: 41 mg/dL (ref >40)
LDL Cholesterol: 42 mg/dL (ref 0–99)
Total CHOL/HDL Ratio: 2.4 RATIO
Triglycerides: 68 mg/dL (ref <150)
VLDL: 14 mg/dL (ref 0–40)

## 2020-09-17 LAB — MAGNESIUM: Magnesium: 2 mg/dL (ref 1.7–2.4)

## 2020-09-17 MED ORDER — PANTOPRAZOLE SODIUM 40 MG PO PACK: 40.0000 mg | PACK | Freq: Every day | ORAL | Status: DC

## 2020-09-17 MED ORDER — POLYETHYLENE GLYCOL 3350 17 G PO PACK: 17.0000 g | PACK | Freq: Every day | ORAL | Status: DC | PRN

## 2020-09-17 MED ORDER — METOPROLOL TARTRATE 50 MG PO TABS
75.0000 mg | ORAL_TABLET | Freq: Two times a day (BID) | ORAL | Status: DC
  Administered 2020-09-17: 75 mg via ORAL
  Filled 2020-09-17: qty 1

## 2020-09-17 MED ORDER — METOPROLOL TARTRATE 100 MG PO TABS
100.0000 mg | ORAL_TABLET | Freq: Two times a day (BID) | ORAL | Status: DC
  Administered 2020-09-17 – 2020-09-22 (×10): 100 mg via ORAL
  Filled 2020-09-17 (×10): qty 1

## 2020-09-17 MED ORDER — PANTOPRAZOLE SODIUM 40 MG PO PACK: 40.0000 mg | PACK | Freq: Two times a day (BID) | ORAL | Status: DC

## 2020-09-17 MED ORDER — PANTOPRAZOLE SODIUM 40 MG PO TBEC
40.0000 mg | DELAYED_RELEASE_TABLET | Freq: Two times a day (BID) | ORAL | Status: DC
  Administered 2020-09-17 – 2020-09-22 (×11): 40 mg via ORAL
  Filled 2020-09-17 (×11): qty 1

## 2020-09-17 MED ORDER — CALCIUM CARBONATE ANTACID 500 MG PO CHEW
1.0000 | CHEWABLE_TABLET | Freq: Every day | ORAL | Status: DC
  Administered 2020-09-17 – 2020-09-22 (×6): 200 mg via ORAL
  Filled 2020-09-17 (×6): qty 1

## 2020-09-17 MED ORDER — LIDOCAINE VISCOUS HCL 2 % MT SOLN
15.0000 mL | Freq: Once | OROMUCOSAL | Status: AC
  Administered 2020-09-17: 15 mL via ORAL
  Filled 2020-09-17: qty 15

## 2020-09-17 MED ORDER — DULOXETINE HCL 60 MG PO CPEP
60.0000 mg | ORAL_CAPSULE | Freq: Every day | ORAL | Status: DC
  Administered 2020-09-17 – 2020-09-22 (×6): 60 mg via ORAL
  Filled 2020-09-17 (×6): qty 1

## 2020-09-17 MED ORDER — ALUM & MAG HYDROXIDE-SIMETH 200-200-20 MG/5ML PO SUSP
30.0000 mL | Freq: Every day | ORAL | Status: DC | PRN
  Administered 2020-09-20: 30 mL via ORAL
  Filled 2020-09-17: qty 30

## 2020-09-17 MED ORDER — APIXABAN 5 MG PO TABS
5.0000 mg | ORAL_TABLET | Freq: Two times a day (BID) | ORAL | Status: DC
  Administered 2020-09-17 – 2020-09-22 (×10): 5 mg via ORAL
  Filled 2020-09-17 (×11): qty 1

## 2020-09-17 NOTE — Progress Notes (Signed)
FPTS Interim Progress Note  Went to check on patient along with Dr. Jeannine Kitten, she was sleeping comfortably with no obvious signs of distress. Talked to nurse who did not have any further concerns, notified her to contact primary team intern pager for any concerns that may arise through the night.   Donney Dice, DO 09/17/2020, 8:43 PM PGY-1, [place] Medicine Service pager [phone]

## 2020-09-17 NOTE — Progress Notes (Addendum)
ANTICOAGULATION CONSULT NOTE - Follow Up Consult  Pharmacy Consult for heparin Indication: atrial fibrillation  Labs: Recent Labs    09/15/20 0319 09/16/20 0352 09/16/20 1725 09/16/20 1924 09/17/20 0208 09/17/20 1127  HGB 12.1 11.8*  --   --  12.1  --   HCT 37.9 37.1  --   --  38.1  --   PLT 204 216  --   --  236  --   HEPARINUNFRC  --   --   --   --  0.59 0.24*  CREATININE 0.46 0.50  --   --  0.44  --   TROPONINIHS  --   --  8 9  --   --     Assessment:  64yo female presented to [facility] ED via EMS for AMS at home.  Patient developed atrial fibrillation on 09/12/20.  Pharmacy was originally consulted to dose Lovenox, however patient developed chest pain. Cardiology following. Pharmacy now consulted to dose IV heparin.  Troponins negative. Chest pain likely epigastric.  Remains on IV heparin due to atrial fibrillation.   Heparin level subtherapeutic at 0.24.  CBC stable.  No bleeding noted during rounds and line running without issues per RN.   Plan: Increase heparin drip to 1200 units/hr (~2 units/kg/hr increase) F/u 6 hour HL Daily CBC Monitor for s/sx bleeding F/u ability to transition to PO anticoagulation  Dimple Nanas, PharmD PGY-1 Acute Care Pharmacy Resident Office: [phone] 09/17/2020 1:17 PM

## 2020-09-17 NOTE — Progress Notes (Signed)
ANTICOAGULATION CONSULT NOTE - Follow Up Consult  Pharmacy Consult for heparin Indication: atrial fibrillation  Labs: Recent Labs    09/14/20 0333 09/15/20 0319 09/16/20 0352 09/16/20 1725 09/16/20 1924 09/17/20 0208  HGB 11.7* 12.1 11.8*  --   --  12.1  HCT 36.6 37.9 37.1  --   --  38.1  PLT 155 204 216  --   --  236  HEPARINUNFRC  --   --   --   --   --  0.59  CREATININE 0.52 0.46 0.50  --   --   --   TROPONINIHS  --   --   --  8 9  --     Assessment/Plan:  65yo female therapeutic on heparin with initial dosing transitioning from [place]. Will continue gtt at current rate of 1100 units/hr and confirm stable with additional level.   Wynona Neat, PharmD, BCPS  09/17/2020,2:58 AM

## 2020-09-17 NOTE — TOC Initial Note (Addendum)
Transition of Care [facility]) - Initial/Assessment Note    Patient Details  Name: Karen Casey MRN: [id number] Date of Birth: 11/23/1955  Transition of [facility]) CM/SW Contact:    Bartholomew Crews, RN Phone Number: [phone] 09/17/2020, 12:30 PM  Clinical Narrative:                  Spoke with patient at the bedside to discuss transition planning. Unable to talk to patient too long d/t pending need for nursing care. PTA home with boyfriend. Independent. Discussed alcohol use - "I'm not doing that any more." Advised of resources to be provided. Discussed arrangements for [facility] PT, SW through [facility] - patient agreeable to [facility] follow up after transition home. Patient verified PCP in Epic as correct. TOC following for transition needs.   Expected Discharge Plan: [place] Barriers to Discharge: Continued Medical Work up   Patient Goals and CMS Choice Patient states their goals for this hospitalization and ongoing recovery are:: return home CMS Medicare.gov Compare Post Acute Care list provided to:: Patient Choice offered to / list presented to : Patient  Expected Discharge Plan and Services Expected Discharge Plan: [place]   Discharge Planning Services: CM Consult Post Acute Care Choice: [place] arrangements for the past 2 months: Apartment                           HH Arranged: PT,Social Work [place]: [facility] [employer] [facility] [employer] (now known as [employer]) Date [place]: 09/16/20 Time [place]: 1659 Representative spoke with at [place]: [place]  Prior Living Arrangements/Services Living arrangements for the past 2 months: [place] with:: Self,Significant Other Patient language and need for interpreter reviewed:: Yes Do you feel safe going back to the place where you live?: Yes            Criminal Activity/Legal Involvement Pertinent to Current Situation/Hospitalization: No -  Comment as needed  Activities of Daily Living      Permission Sought/Granted                  Emotional Assessment Appearance:: Appears stated age Attitude/Demeanor/Rapport: Engaged Affect (typically observed): Accepting Orientation: : Oriented to Self,Oriented to  Time,Oriented to Place,Oriented to Situation Alcohol / Substance Use: Alcohol Use Psych Involvement: No (comment)  Admission diagnosis:  Alcohol withdrawal ([place]) [F10.239] Status epilepticus ([place]) [G40.901] Hyponatremia [E87.1] Alcohol withdrawal seizure with delirium ([place]) [K93.267, R56.9] Patient Active Problem List   Diagnosis Date Noted  . Protein-calorie malnutrition ([place]) 09/17/2020  . Alcohol withdrawal seizure with delirium ([place])   . Status epilepticus ([place])   . Hyponatremia   . Paroxysmal atrial fibrillation with rapid ventricular response ([place])   . Shortness of breath   . Precordial pain   . Alcohol withdrawal ([place]) 09/10/2020  . Pulmonary nodules/lesions, multiple 07/14/2020  . Chronic left shoulder pain 06/22/2020  . Allergic conjunctivitis of both eyes 06/22/2020  . Hammer toe 03/06/2020  . HTN (hypertension) 06/02/2018  . Palpitations 06/02/2018  . Body mass index (BMI) 31.0-31.9, adult 03/29/2013  . Back pain 02/14/2013  . Anxiety disorder 06/12/2012  . Alcohol use 05/28/2012  . Irritable bowel syndrome 05/10/2012  . Tobacco use disorder 02/17/2012  . GERD (gastroesophageal reflux disease) 02/04/2012  . Depression 02/04/2012   PCP:  [name], MD Pharmacy:   [place] [facility] [phone] - [place] [place] [address] Phone: [phone] Fax: [phone]     Social Determinants of Health (SDOH) Interventions    Readmission Risk Interventions No flowsheet data found.

## 2020-09-17 NOTE — Progress Notes (Addendum)
  Speech Language Pathology Treatment: Dysphagia  Patient Details Name: Karen Casey MRN: [id number] DOB: 11-Jun-1956 Today's Date: 09/17/2020 Time: 4103-0131 SLP Time Calculation (min) (ACUTE ONLY): 16.7 min  Assessment / Plan / Recommendation Clinical Impression  Pt was seen for dysphagia treatment with her sister present. Pt was cooperative throughout the session. Pt and Apolonio Schneiders, RN reported that the pt has been tolerating the current diet without overt s/sx of aspiration, but that p.o. intake has been limited and she has been refusing ground meats. Pt's RN reported that the pt spat out larger pills which were given in puree during a prior shift and that pills have therefore been crushed. Despite her suboptimal positioning due to back pain, no s/sx of aspiration were noted with thin liquids, dysphagia 2 solids, dysphagia 3 solids, purees, thin liquids, or with meds given in puree. Pt's RR was >35 at the onset of the session, but this improved to 25-27 during trials. Mastication of dysphagia 3 solids was [facility] with adequate oral clearance. Pt's diet will be advanced to dysphagia 3 solids and thin liquids. SLP will continue to follow pt.    HPI HPI: Pt is a 65 year old female with PMHx significant for depression/anxiety (chronic Xanax use with history of seizure 2/2 withdrawal 2013), EtOH abuse (drinks estimated 6-10 beers/day) and tobacco abuse who presented to [facility] ED on 3/29 after being found altered at home. On admission pt's family reported periods of cyclical vomiting that last up to a week. Pt had witnessed seizure in CT (non-focal shaking movements, 3-5 minutes long per CT staff). Pt received a total of 4mg  Ativan and, upon return to ED was minimally responsive (likely post-ictal) and not protecting her airway. ETT 3/29-4/1 [phone]). CXR 3/31: Progressive left basilar consolidation. Progressive pulmonary hypoinflation; BSE completed on 09/12/20 with PO readiness goals initiated.      SLP Plan   Continue with current plan of care       Recommendations  Diet recommendations: Dysphagia 3 (mechanical soft);Thin liquid Liquids provided via: Cup;Straw Medication Administration: Whole meds with puree Supervision: Patient able to self feed;Staff to assist with self feeding;Full supervision/cueing for compensatory strategies Compensations: Slow rate;Small sips/bites (rest breaks if dyspneic) Postural Changes and/or Swallow Maneuvers: Seated upright 90 degrees                Oral Care Recommendations: Oral care BID Follow up Recommendations:  (TBD) SLP Visit Diagnosis: Dysphagia, oropharyngeal phase (R13.12) Plan: Continue with current plan of care       Karen Casey, [place], [place] Office number [phone] Pager [phone]                Horton Marshall 09/17/2020, 12:11 PM

## 2020-09-17 NOTE — Progress Notes (Signed)
Pt transferred to 5W25 without incident.

## 2020-09-17 NOTE — Progress Notes (Signed)
Family Medicine Teaching Service Daily Progress Note Intern Pager: [phone]  Patient name: Karen Casey Medical record number: [id number] Date of birth: 03/02/1956 Age: 65 y.o. Gender: female  Primary Care Provider: [name], MD Consultants: CCM (s/o), cardiology Code Status: Full  Pt Overview and Major Events to Date:  3/29 Seizure, intubated, admitted to ICU 3/30 Precedex started 4/01 Extubated 4/03 Off Precedex (last 4/2 @2100 ) 4/04 Transferred to care of [facility] 4/05 Transferred to care of [place] Team  Assessment and Plan: Karen Casey is a 65 year old female who presents to the ED with AMS, agitation was found to be in alcohol and benzo withdrawal.  Had a seizure, was intubated, admitted to the ICU.  CCM transitioned to family medicine team 4/04.  PMH includes depression, anxiety, chronic Xanax use (history of seizure from withdrawal in 2013), EtOH abuse (drinks 6-10 beers a day), tobacco abuse (43 pack years).  Medically appropriate for transfer to progressive floor.  ETOH/benzo withdrawal No seizures overnight, no CIWA scores recorded last 24 hours. -Seizure precautions -D/C CIWA protocol, as out of withdrawal period -Continue thiamine, folate -Xanax 2 mg twice daily  Acute hypoxic respiratory failure Worsening wheezing and crackling yesterday afternoon, unrelieved by breathing treatments x2.  CXR unchanged from morning.  Troponin negative x2.  Given GI cocktail, Lasix 20 mg once with improvement. -Continue supplemental O2 with SPO2 goal >94% -Wean O2 as tolerated -Albuterol neb as needed  Dysphagia  heartburn SLP recommends dysphagia 2 diet. -Continue aspiration precautions -GI cocktail daily as needed -Tums as needed -Increase Protonix to twice daily  New onset atrial fibrillation Cardiology on board, put her on diltiazem drip and heparin.  Appreciate ongoing care.  Electrolyte goals her potassium >4,  magnesium >2. -Diltiazem drip per cardiology -Heparin per cardiology  Hypokalemia Potassium goal given new onset atrial fibrillation is >4.   Potassium 4.0 this morning, at goal.  No repletion at this time. -A.m. BMP  Hypomagnesemia A.m. magnesium 2.0, at goal.  No repletion indicated at this time. -A.m. magnesium  Chronic anxiety Home meds include Celexa, gabapentin, Abilify, Seroquel, Xanax, Cymbalta.  Per neurology, providing Xanax 2 mg twice daily with long-term goal to taper down. -Cymbalta held in ICU, will restart today -Continue Celexa, gabapentin, Abilify, Seroquel -Low threshold to stop or reduce medications if patient becomes sleepy somnolent or altered  Long-term tobacco use Continue negative as per  FEN/GI: Dysphagia 2 diet PPx: Lovenox   Status is: Inpatient  Remains inpatient appropriate because:Altered mental status, Ongoing diagnostic testing needed not appropriate for outpatient work up and Inpatient level of care appropriate due to severity of illness   Dispo:  Patient From: Home  Planned Disposition: Home with Health Care Svc  Medically stable for discharge: No     Subjective:  Patient found sitting up in chair with sister at bedside.  Reports feeling quite poorly and has continued abdominal pain, heartburn sensation.  Objective: Temp:  [97.8 F (36.6 C)-98.3 F (36.8 C)] 98 F (36.7 C) (04/06 0700) Pulse Rate:  [80-155] 103 (04/06 0802) Resp:  [12-37] 23 (04/06 0802) BP: (77-161)/(49-106) 161/79 (04/06 0800) SpO2:  [89 %-97 %] 95 % (04/06 0802) Weight:  [92.2 kg] 92.2 kg (04/06 0500) Physical Exam: General: Awake, alert, no acute distress Cardiovascular: Irregularly irregular rhythm, normal rate, no murmur appreciated Respiratory: Wheezing in anterior superior fields, no crackles appreciated Abdomen: No TTP  Extremities: No BLE edema  Laboratory: Recent Labs  Lab 09/15/20 0319 09/16/20 0352 09/17/20 3875  WBC 8.9 8.0 9.6  HGB  12.1 11.8* 12.1  HCT 37.9 37.1 38.1  PLT 204 216 236   Recent Labs  Lab 09/15/20 0319 09/16/20 0352 09/17/20 0208  NA 135 135 136  K 4.0 3.7 4.0  CL 92* 89* 93*  CO2 36* 41* 38*  BUN <5* <5* 5*  CREATININE 0.46 0.50 0.44  CALCIUM 8.7* 8.6* 8.9  PROT  --   --  5.7*  BILITOT  --   --  0.4  ALKPHOS  --   --  74  ALT  --   --  20  AST  --   --  19  GLUCOSE 119* 108* 110*    Imaging/Diagnostic Tests: PORTABLE CHEST 1 VIEW 09/16/2020 COMPARISON:  04/05/202 FINDINGS: Cardiac enlargement with pulmonary vascular congestion and perihilar interstitial and alveolar infiltrates. Small bilateral pleural effusions. Right PICC line with tip over the low SVC region. No pneumothorax. Similar appearance to previous study. IMPRESSION: Cardiac enlargement with pulmonary vascular congestion, perihilar edema, and small bilateral pleural effusions.    Ezequiel Essex, MD 09/17/2020, 11:57 AM PGY-1, [place] Intern pager: [phone], text pages welcome

## 2020-09-17 NOTE — Progress Notes (Signed)
Subjective:  Patient seen and examined at bedside at approximately 8:15 AM. Patient was sitting up in her chair, presently on supplemental oxygen via nasal cannula at 3 L/min.  She has had no chest pain since yesterday, however she states she continues to have occasional episodes of heart racing symptoms.  Denies dizziness, lightheadedness, shortness of breath.  Serial troponins negative.  Lipid profile testing reveals lipids are well controlled and A1c is 5.2%.  Since admission patient is net -3.8 L.  Patient remains in atrial fibrillation, however rate control has improved with maximum ventricular rate overnight 127 bpm, rate averaging 90-100-100 bpm.  Overnight patient's blood pressure dropped as low as 88/60 2 mmHg while she was on diltiazem infusion and about 2 hours after receiving Lopressor 50 mg.  Patient does not recall symptoms during this episode, states she believes she was asleep.   Patient;s primary complaint at the time of my exam is low back pain, she is therefore requesting that she be moved out of her chair and back into bed.   Intake/Output from previous day:  I/O last 3 completed shifts: In: 1501.1 [P.O.:270; I.V.:1061.1; Other:120; IV Piggyback:50] Out: 4950 [Urine:4950] Total I/O In: 93 [I.V.:93] Out: -   Blood pressure (!) 161/79, pulse (!) 103, temperature 98 F (36.7 C), temperature source Oral, resp. rate (!) 23, height '5\' 6"'  (1.676 m), weight 92.2 kg, SpO2 95 %. Physical Exam Vitals reviewed.  Constitutional:      General: She is not in acute distress.    Appearance: She is obese.  HENT:     Head: Normocephalic and atraumatic.     Nose: Nose normal.     Mouth/Throat:     Mouth: Mucous membranes are moist.  Eyes:     Conjunctiva/sclera: Conjunctivae normal.  Neck:     Vascular: No carotid bruit.  Cardiovascular:     Rate and Rhythm: Normal rate. Rhythm irregularly irregular.     Pulses: Intact distal pulses.     Heart sounds: S1 normal and S2 normal. No  murmur heard. No gallop.   Pulmonary:     Effort: Pulmonary effort is normal. No respiratory distress.     Breath sounds: Wheezing (bilaterally throughout) present. No rhonchi or rales.  Abdominal:     General: Bowel sounds are normal. There is no distension.     Palpations: Abdomen is soft.  Musculoskeletal:     Cervical back: Neck supple.     Right lower leg: No edema.     Left lower leg: No edema.  Skin:    General: Skin is warm and dry.  Neurological:     Mental Status: She is alert.     Lab Results: BMP BNP (last 3 results) Recent Labs    09/15/20 1624  BNP 684.3*    ProBNP (last 3 results) No results for input(s): PROBNP in the last 8760 hours. BMP Latest Ref Rng & Units 09/17/2020 09/16/2020 09/15/2020  Glucose 70 - 99 mg/dL 110(H) 108(H) 119(H)  BUN 8 - 23 mg/dL 5(L) <5(L) <5(L)  Creatinine 0.44 - 1.00 mg/dL 0.44 0.50 0.46  BUN/Creat Ratio 12 - 28 - - -  Sodium 135 - 145 mmol/L 136 135 135  Potassium 3.5 - 5.1 mmol/L 4.0 3.7 4.0  Chloride 98 - 111 mmol/L 93(L) 89(L) 92(L)  CO2 22 - 32 mmol/L 38(H) 41(H) 36(H)  Calcium 8.9 - 10.3 mg/dL 8.9 8.6(L) 8.7(L)   Hepatic Function Latest Ref Rng & Units 09/17/2020 09/10/2020 09/09/2020  Total Protein 6.5 -  8.1 g/dL 5.7(L) 7.0 7.0  Albumin 3.5 - 5.0 g/dL 2.4(L) 3.8 4.0  AST 15 - 41 U/L 19 45(H) 46(H)  ALT 0 - 44 U/L 20 32 34  Alk Phosphatase 38 - 126 U/L 74 68 66  Total Bilirubin 0.3 - 1.2 mg/dL 0.4 1.6(H) 1.7(H)  Bilirubin, Direct 0.0 - 0.3 mg/dL - - -   CBC Latest Ref Rng & Units 09/17/2020 09/16/2020 09/15/2020  WBC 4.0 - 10.5 K/uL 9.6 8.0 8.9  Hemoglobin 12.0 - 15.0 g/dL 12.1 11.8(L) 12.1  Hematocrit 36.0 - 46.0 % 38.1 37.1 37.9  Platelets 150 - 400 K/uL 236 216 204   Lipid Panel     Component Value Date/Time   CHOL 97 09/17/2020 0208   CHOL 154 08/23/2017 0945   TRIG 68 09/17/2020 0208   HDL 41 09/17/2020 0208   HDL 78 08/23/2017 0945   CHOLHDL 2.4 09/17/2020 0208   VLDL 14 09/17/2020 0208   LDLCALC 42 09/17/2020  0208   LDLCALC 61 08/23/2017 0945   LDLDIRECT 58 12/18/2013 1345   Cardiac Panel (last 3 results) No results for input(s): CKTOTAL, CKMB, TROPONINI, RELINDX in the last 72 hours.  HEMOGLOBIN A1C Lab Results  Component Value Date   HGBA1C 5.2 09/17/2020   MPG 102.54 09/17/2020   TSH Recent Labs    09/12/20 0949  TSH 0.827   Imaging: Imaging results have been reviewed  Cardiac Studies:  Echocardiogram 09/10/2020: 1. Left ventricular ejection fraction, by estimation, is 60 to 65%. The  left ventricle has normal function. The left ventricle has no regional  wall motion abnormalities. There is mild left ventricular hypertrophy.  Left ventricular diastolic parameters  were normal.  2. Right ventricular systolic function is normal. The right ventricular  size is normal. There is normal pulmonary artery systolic pressure.  3. The mitral valve is normal in structure. Trivial mitral valve  regurgitation. No evidence of mitral stenosis.  4. The aortic valve is tricuspid. Aortic valve regurgitation is not  visualized. No aortic stenosis is present.  5. The inferior vena cava is normal in size with greater than 50%  respiratory variability, suggesting right atrial pressure of 3 mmHg.   Telemetry:  Atrial fibrillation with controlled ventricular response, episodes of rapid ventricular response   EKG:  09/15/2020: Atrial fibrillation with rapid ventricular response at a rate of 119 bpm.  Normal axis.  09/09/2020: Atrial fibrillation with rapid ventricular response at a rate of 156 bpm.  Normal axis.  Scheduled Meds: . ALPRAZolam  2 mg Oral BID  . ARIPiprazole  5 mg Oral Daily  . calcium carbonate  1 tablet Oral Daily  . chlorhexidine  15 mL Mouth Rinse BID  . Chlorhexidine Gluconate Cloth  6 each Topical Daily  . citalopram  40 mg Oral Daily  . DULoxetine  60 mg Oral Daily  . feeding supplement  237 mL Oral TID BM  . folic acid  1 mg Oral Daily  . furosemide  20 mg  Intravenous Daily  . gabapentin  1,200 mg Oral TID  . Gerhardt's butt cream   Topical QID  . ipratropium-albuterol  3 mL Nebulization TID  . mouth rinse  15 mL Mouth Rinse q12n4p  . metoprolol tartrate  75 mg Oral BID  . multivitamin with minerals  1 tablet Oral Daily  . nicotine  7 mg Transdermal Daily  . pantoprazole  40 mg Oral BID  . QUEtiapine  150 mg Oral QHS  . sodium chloride flush  10-40  mL Intracatheter Q12H  . thiamine  100 mg Oral Daily   Continuous Infusions: . sodium chloride 10 mL/hr at 09/17/20 1000  . heparin 1,100 Units/hr (09/17/20 1000)   PRN Meds:.acetaminophen, albuterol, alum & mag hydroxide-simeth **AND** [COMPLETED] lidocaine, docusate, LORazepam, metoprolol tartrate, ondansetron (ZOFRAN) IV, polyethylene glycol, Resource ThickenUp Clear, sodium chloride flush  Assessment/Plan:  Persistent atrial fibrillation: Rate controlled: Increase Lopressor from 50 mg to 75 mg twice daily.  Discontinue diltiazem infusion Rhythm control: N/A Thromboembolic prophylaxis: Continue heparin drip  Patient now appears to be in persistent atrial fibrillation as last documentation of sinus rhythm was on 09/09/2020.  Rate control improved overnight with diltiazem infusion, however patient's blood pressure dropped with administration of Lopressor 50 mg.  Although patient's blood pressure is improved this morning, will discontinue diltiazem infusion and agree with primary team's order to increase Lopressor from 50 mg to 75 mg twice daily.  Although patient's CHA2DS2-VASc score is low, she is now in persistent atrial fibrillation and would likely continue to benefit from anticoagulation, at least in the short-term until restoration of sinus rhythm whether spontaneous or via cardioversion.  Patient will also need close outpatient follow-up with cardiology for determining anticoagulation duration.  Discussed with both patient and her sister, who was present at bedside on exam, regarding risks  and benefits of anticoagulation as well as factors to consider in determining duration of anticoagulation, particularly regarding outpatient anticoagulation.  Patient and her sister both verbalized understanding of risks and benefits, and are in agreement to proceed with anticoagulation both inpatient and outpatient with close follow-up to determine further management.   Electrolyte replacements per primary team, with goal potassium of 4 and magnesium at 2.  Shortness of breath: Patient reports her shortness of breath has improved today, likely due to her rate control being improved.  She is presently on supplemental oxygen via nasal cannula 3 L/min. Patient is net negative 3.7L since admission. Renal function remains stable. Continue Lasix 20 mg IV daily. Continue strict I&O and daily weights.   Precordial pain: Patient reports pain has resolved since yesterday.  Serial troponins have been negative.  Do not suspect ACS, therefore no indication for ischemic evaluation while admitted inpatient at this time.  Although discussed with patient and her sister regarding need for ischemic evaluation on an outpatient basis.  Patient was seen in collaboration with Dr. Terri Skains. He also reviewed patient's chart and examined the patient. Dr. Terri Skains is in agreement of the plan.     Alethia Berthold, PA-C 09/17/2020, 12:49 PM Office: [phone]

## 2020-09-18 ENCOUNTER — Other Ambulatory Visit (HOSPITAL_COMMUNITY): Payer: Self-pay

## 2020-09-18 DIAGNOSIS — Z7901 Long term (current) use of anticoagulants: Secondary | ICD-10-CM

## 2020-09-18 DIAGNOSIS — J9601 Acute respiratory failure with hypoxia: Secondary | ICD-10-CM | POA: Diagnosis not present

## 2020-09-18 DIAGNOSIS — F10231 Alcohol dependence with withdrawal delirium: Secondary | ICD-10-CM | POA: Diagnosis not present

## 2020-09-18 DIAGNOSIS — R569 Unspecified convulsions: Secondary | ICD-10-CM | POA: Diagnosis not present

## 2020-09-18 LAB — BASIC METABOLIC PANEL
Anion gap: 4 — ABNORMAL LOW (ref 5–15)
BUN: 5 mg/dL — ABNORMAL LOW (ref 8–23)
CO2: 34 mmol/L — ABNORMAL HIGH (ref 22–32)
Calcium: 8.9 mg/dL (ref 8.9–10.3)
Chloride: 90 mmol/L — ABNORMAL LOW (ref 98–111)
Creatinine, Ser: 0.47 mg/dL (ref 0.44–1.00)
GFR, Estimated: >60 mL/min (ref >60)
Glucose, Bld: 103 mg/dL — ABNORMAL HIGH (ref 70–99)
Potassium: 4 mmol/L (ref 3.5–5.1)
Sodium: 128 mmol/L — ABNORMAL LOW (ref 135–145)

## 2020-09-18 LAB — CBC
HCT: 38.6 % (ref 36.0–46.0)
Hemoglobin: 12.5 g/dL (ref 12.0–15.0)
MCH: 32.9 pg (ref 26.0–34.0)
MCHC: 32.4 g/dL (ref 30.0–36.0)
MCV: 101.6 fL — ABNORMAL HIGH (ref 80.0–100.0)
Platelets: 271 10*3/uL (ref 150–400)
RBC: 3.8 MIL/uL — ABNORMAL LOW (ref 3.87–5.11)
RDW: 12.7 % (ref 11.5–15.5)
WBC: 10.7 10*3/uL — ABNORMAL HIGH (ref 4.0–10.5)
nRBC: 0 % (ref 0.0–0.2)

## 2020-09-18 LAB — GLUCOSE, CAPILLARY: Glucose-Capillary: 43 mg/dL — CL (ref 70–99)

## 2020-09-18 LAB — MAGNESIUM: Magnesium: 1.7 mg/dL (ref 1.7–2.4)

## 2020-09-18 MED ORDER — MAGNESIUM SULFATE 2 GM/50ML IV SOLN
2.0000 g | Freq: Once | INTRAVENOUS | Status: AC
  Administered 2020-09-18: 2 g via INTRAVENOUS
  Filled 2020-09-18: qty 50

## 2020-09-18 NOTE — Plan of Care (Signed)
  Problem: Education: Goal: Knowledge of disease or condition will improve Outcome: Progressing Goal: Understanding of discharge needs will improve Outcome: Progressing   Problem: Health Behavior/Discharge Planning: Goal: Ability to identify changes in lifestyle to reduce recurrence of condition will improve Outcome: Progressing Goal: Identification of resources available to assist in meeting health care needs will improve Outcome: Progressing   Problem: Physical Regulation: Goal: Complications related to the disease process, condition or treatment will be avoided or minimized Outcome: Progressing   Problem: Safety: Goal: Ability to remain free from injury will improve Outcome: Progressing   Problem: Education: Goal: Knowledge of General Education information will improve Description: Including pain rating scale, medication(s)/side effects and non-pharmacologic comfort measures Outcome: Progressing   Problem: Health Behavior/Discharge Planning: Goal: Ability to manage health-related needs will improve Outcome: Progressing   Problem: Clinical Measurements: Goal: Ability to maintain clinical measurements within normal limits will improve Outcome: Progressing Goal: Will remain free from infection Outcome: Progressing Goal: Diagnostic test results will improve Outcome: Progressing Goal: Respiratory complications will improve Outcome: Progressing Goal: Cardiovascular complication will be avoided Outcome: Progressing   Problem: Nutrition: Goal: Adequate nutrition will be maintained Outcome: Progressing   Problem: Coping: Goal: Level of anxiety will decrease Outcome: Progressing   Problem: Elimination: Goal: Will not experience complications related to bowel motility Outcome: Progressing Goal: Will not experience complications related to urinary retention Outcome: Progressing   Problem: Pain Managment: Goal: General experience of comfort will improve Outcome:  Progressing   Problem: Safety: Goal: Ability to remain free from injury will improve Outcome: Progressing   Problem: Skin Integrity: Goal: Risk for impaired skin integrity will decrease Outcome: Progressing

## 2020-09-18 NOTE — Progress Notes (Signed)
Progress Note  Patient Name: Karen Casey Date of Encounter: 09/18/2020  Attending physician: McDiarmid, Blane Ohara, MD Primary care provider: [name], MD Consultant:Tal Neer Terri Skains, DO  Subjective: Karen Casey is a 65 y.o. female who was seen and examined at bedside. Patient is accompanied by her daughter at bedside.  Transferred from ICU to medical floor Denies any chest pain or shortness of breath at rest or with effort related activities. Ventricular rate remains stable, continues to be off of Cardizem drip   Objective: Vital Signs in the last 24 hours: Temp:  [97.6 F (36.4 C)-99.2 F (37.3 C)] 98.7 F (37.1 C) (04/07 0749) Pulse Rate:  [76-120] 98 (04/07 0749) Resp:  [14-39] 20 (04/07 0749) BP: (98-176)/(69-146) 130/81 (04/07 0749) SpO2:  [87 %-98 %] 95 % (04/07 0749) FiO2 (%):  [32 %] 32 % (04/07 0736)  Intake/Output:  Intake/Output Summary (Last 24 hours) at 09/18/2020 0801 Last data filed at 09/18/2020 0617 Gross per 24 hour  Intake 153.95 ml  Output 1350 ml  Net -1196.05 ml    Net IO Since Admission: 5,002.4 mL [09/18/20 0801]  Weights:  Filed Weights   09/15/20 0459 09/16/20 0500 09/17/20 0500  Weight: 96.7 kg 96.9 kg 92.2 kg    Telemetry: Personally reviewed.  Atrial fibrillation  Physical examination: PHYSICAL EXAM: Vitals with BMI 09/18/2020 09/18/2020 09/17/2020  Height - - -  Weight - - -  BMI - - -  Systolic 672 98 094  Diastolic 81 69 97  Pulse 98 78 92  Some encounter information is confidential and restricted. Go to Review Flowsheets activity to see all data.   CONSTITUTIONAL: Appears older than stated age, hemodynamically stable, no acute distress    SKIN: Skin is warm and dry. No rash noted. No cyanosis. No pallor. No jaundice  HEAD: Normocephalic and atraumatic.   EYES: No scleral icterus  MOUTH/THROAT: Moist oral membranes.   NECK: No JVD present. No thyromegaly noted. No carotid bruits   LYMPHATIC: No visible cervical adenopathy.    CHEST Normal respiratory effort. No intercostal retractions   LUNGS: Bilateral expiratory wheezes noted.    CARDIOVASCULAR: Irregularly irregular, variable S1-S2 no murmurs rubs or gallops appreciated   ABDOMINAL: No apparent ascites.   EXTREMITIES: No peripheral edema   HEMATOLOGIC: No significant bruising  NEUROLOGIC: Oriented to person, place, and time. Nonfocal. Normal muscle tone.   PSYCHIATRIC: Normal mood and affect. Normal behavior. Cooperative   Lab Results: Hematology Recent Labs  Lab 09/16/20 0352 09/17/20 0208 09/18/20 0450  WBC 8.0 9.6 10.7*  RBC 3.57* 3.68* 3.80*  HGB 11.8* 12.1 12.5  HCT 37.1 38.1 38.6  MCV 103.9* 103.5* 101.6*  MCH 33.1 32.9 32.9  MCHC 31.8 31.8 32.4  RDW 12.8 12.9 12.7  PLT 216 236 271    Chemistry Recent Labs  Lab 09/16/20 0352 09/17/20 0208 09/18/20 0450  NA 135 136 128*  K 3.7 4.0 4.0  CL 89* 93* 90*  CO2 41* 38* 34*  GLUCOSE 108* 110* 103*  BUN <5* 5* 5*  CREATININE 0.50 0.44 0.47  CALCIUM 8.6* 8.9 8.9  PROT  --  5.7*  --   ALBUMIN  --  2.4*  --   AST  --  19  --   ALT  --  20  --   ALKPHOS  --  74  --   BILITOT  --  0.4  --   GFRNONAA >60 >60 >60  ANIONGAP 5 5 4*     Cardiac  Enzymes: Cardiac Panel (last 3 results) Recent Labs    09/16/20 1725 09/16/20 1924  TROPONINIHS 8 9    BNP (last 3 results) Recent Labs    09/15/20 1624  BNP 684.3*    ProBNP (last 3 results) No results for input(s): PROBNP in the last 8760 hours.   DDimer No results for input(s): DDIMER in the last 168 hours.   Hemoglobin A1c:  Lab Results  Component Value Date   HGBA1C 5.2 09/17/2020   MPG 102.54 09/17/2020    TSH  Recent Labs    09/12/20 0949  TSH 0.827    Lipid Panel  Lab Results  Component Value Date   CHOL 97 09/17/2020   HDL 41 09/17/2020   LDLCALC 42 09/17/2020   LDLDIRECT 58 12/18/2013   TRIG 68 09/17/2020   CHOLHDL 2.4 09/17/2020   Imaging: DG CHEST PORT 1 VIEW  Result Date: 09/16/2020 CLINICAL  DATA:  Chest pain and shortness of breath EXAM: PORTABLE CHEST 1 VIEW COMPARISON:  09/16/2020 FINDINGS: Cardiac enlargement with pulmonary vascular congestion and perihilar interstitial and alveolar infiltrates. Small bilateral pleural effusions. Right PICC line with tip over the low SVC region. No pneumothorax. Similar appearance to previous study. IMPRESSION: Cardiac enlargement with pulmonary vascular congestion, perihilar edema, and small bilateral pleural effusions. Electronically Signed   By: Lucienne Capers M.D.   On: 09/16/2020 19:32   DG CHEST PORT 1 VIEW  Result Date: 09/16/2020 CLINICAL DATA:  Wheezing. Shortness of breath. Altered mental status. EXAM: PORTABLE CHEST 1 VIEW COMPARISON:  09/15/2020. FINDINGS: Right PICC line noted in stable position. Cardiomegaly. Diffuse bilateral pulmonary infiltrates/edema in small bilateral pleural effusions again noted without interim change. No change thoracic spine. IMPRESSION: 1.  PICC line stable position. 2.  Cardiomegaly. 3. Diffuse bilateral pulmonary infiltrates/edema and small bilateral pleural effusions again noted. No interim change. Electronically Signed   By: Marcello Moores  Register   On: 09/16/2020 12:11   DG Swallowing Func-Speech Pathology  Result Date: 09/16/2020 Objective Swallowing Evaluation: Type of Study: MBS-Modified Barium Swallow Study  Patient Details Name: Karen Casey MRN: [id number] Date of Birth: May 26, 1956 Today's Date: 09/16/2020 Time: SLP Start Time (ACUTE ONLY): 1310 -SLP Stop Time (ACUTE ONLY): 1330 SLP Time Calculation (min) (ACUTE ONLY): 20 min Past Medical History: Past Medical History: Diagnosis Date . Alcohol abuse  . Allergy  . Anxiety  . Cataract 06/09/2012  Right eye and left eye . Depression  . GERD (gastroesophageal reflux disease)  . 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  Event occurred in 2007 . Seizures ([place])   xanax withdrawl- December 2013 . Urinary incontinence 06/09/2012 Past Surgical History: Past Surgical History:  Procedure Laterality Date . APPENDECTOMY   . CATARACT EXTRACTION  06/09/2012  Left eye . left shoulder dislocation  Sept 2011 HPI: Pt is a 65 year old female with PMHx significant for depression/anxiety (chronic Xanax use with history of seizure 2/2 withdrawal 2013), EtOH abuse (drinks estimated 6-10 beers/day) and tobacco abuse who presented to [facility] ED on 3/29 after being found altered at home. On admission pt's family reported periods of cyclical vomiting that last up to a week. Pt had witnessed seizure in CT (non-focal shaking movements, 3-5 minutes long per CT staff). Pt received a total of 4mg  Ativan and, upon return to ED was minimally responsive (likely post-ictal) and not protecting her airway. ETT 3/29-4/1 [id number]). CXR 3/31: Progressive left basilar consolidation. Progressive pulmonary hypoinflation; BSE completed on 09/12/20 with PO readiness goals initiated.  Subjective: Pt seen in radiology for MBS to  determine least restrictive diet Assessment / Plan / Recommendation CHL IP CLINICAL IMPRESSIONS 09/16/2020 Clinical Impression Pt presents with mild oropharyngeal dysphagia, characterized as follows: Oral: Pt has poor and missing dentition, which impairs her ability to masticate solids efficiently. This results in extended oral prep, which could increase aspiration risk with increased fatigue. No anterior leakage was noted, and no pocketing was seen. Pharyngeal: Pt exhibits swallow reflex at the level of the pyriform sinus on thin and nectar thick liquids, and at the vallecular sinus on puree and solid textures. There is no post swallow residue following any consistency. Pt exhibits intermittent flash penetration of thin and nectar thick liquids during the swallow. Straw use was noted to result in more significant and deeper penetration. Pt was observed to be impulsive with liquids, taking a very large bolus orally when instructed to take a very small sip. Small sips via cup did not result in penetration.  Recommend dys 2 (finely chopped) solids with thin liquids via CUP ONLY - no straws. Pt will need 1:1 supervision to manage bolus size and rate to maximize swallow safety. Medications should be given in puree. Large pills crushed. Safe swallow precautions were reviewed with pt and RN after this study, and sent with transport back to pt's room.  SLP will follow for education and to assess tolerance of advanced textures.  SLP Visit Diagnosis Dysphagia, oropharyngeal phase (R13.12)     Impact on safety and function Moderate aspiration risk   CHL IP TREATMENT RECOMMENDATION 09/16/2020 Treatment Recommendations Therapy as outlined in treatment plan below   Prognosis 09/16/2020 Prognosis for Safe Diet Advancement Good Barriers to Reach Goals Severity of deficits;Cognitive deficits   CHL IP DIET RECOMMENDATION 09/16/2020 SLP Diet Recommendations Dysphagia 2 (Fine chop) solids;Thin liquid Liquid Administration via Cup only No straws  Medication Administration Whole meds with puree, crush large pills  Compensations Slow rate Small sips/bites Minimize environmental distractions  Postural Changes Seated upright at 90 degrees Remain semi-upright after after feeds/meals    CHL IP OTHER RECOMMENDATIONS 09/16/2020   Oral Care Recommendations Oral care BID       CHL IP FREQUENCY AND DURATION 09/16/2020 Speech Therapy Frequency (ACUTE ONLY) min 2x/week Treatment Duration 2 weeks      CHL IP ORAL PHASE 09/16/2020 Oral Phase Impaired   Oral - Regular Impaired mastication, likely due to poor dentition    CHL IP PHARYNGEAL PHASE 09/16/2020 Pharyngeal Phase Impaired   Pharyngeal- Nectar Cup Delayed swallow initiation-pyriform sinuses;Penetration/Aspiration during swallow;Reduced airway/laryngeal closure  Pharyngeal Material does not enter airway;Material enters airway, remains ABOVE vocal cords then ejected out   Pharyngeal- Thin Cup, Straw Delayed swallow initiation-pyriform sinuses;Penetration/Aspiration during swallow;Reduced airway/laryngeal  closure Penetration was more significant and deeper with straw sips  Pharyngeal Material does not enter airway;Material enters airway, remains ABOVE vocal cords then ejected out No penetration seen during small individual sips.  Pharyngeal- Thin Straw Delayed swallow initiation-pyriform sinuses;Penetration/Aspiration during swallow  Pharyngeal Material enters airway, remains ABOVE vocal cords then ejected out  Pharyngeal- Puree Delayed swallow initiation-vallecula  Pharyngeal- Regular Delayed swallow initiation-vallecula   CHL IP CERVICAL ESOPHAGEAL PHASE 09/16/2020 Cervical Esophageal Phase [facility] Celia B. Quentin Ore [facility], [place] Speech Language Pathologist Office: [phone] Shonna Chock 09/16/2020, 2:20 PM               Cardiac database: EKG:  09/09/2020: Atrial fibrillation with rapid ventricular response at a rate of 156 bpm. Normal axis. 09/15/2020: Atrial fibrillation with rapid ventricular response at a rate of 119 bpm. Normal  axis.  Echocardiogram: 09/10/2020: 1. Left ventricular ejection fraction, by estimation, is 60 to 65%. The left ventricle has normal function. The left ventricle has no regional wall motion abnormalities. There is mild left ventricular hypertrophy. Left ventricular diastolic parameters were normal.  2. Right ventricular systolic function is normal. The right ventricular size is normal. There is normal pulmonary artery systolic pressure.  3. The mitral valve is normal in structure. Trivial mitral valve regurgitation. No evidence of mitral stenosis.  4. The aortic valve is tricuspid. Aortic valve regurgitation is not visualized. No aortic stenosis is present.  5. The inferior vena cava is normal in size with greater than 50% respiratory variability, suggesting right atrial pressure of 3 mmHg.    Scheduled Meds: . ALPRAZolam  2 mg Oral BID  . apixaban  5 mg Oral BID  . ARIPiprazole  5 mg Oral Daily  . calcium carbonate  1 tablet Oral Daily  . chlorhexidine  15 mL  Mouth Rinse BID  . Chlorhexidine Gluconate Cloth  6 each Topical Daily  . citalopram  40 mg Oral Daily  . DULoxetine  60 mg Oral Daily  . feeding supplement  237 mL Oral TID BM  . folic acid  1 mg Oral Daily  . furosemide  20 mg Intravenous Daily  . gabapentin  1,200 mg Oral TID  . Gerhardt's butt cream   Topical QID  . ipratropium-albuterol  3 mL Nebulization TID  . mouth rinse  15 mL Mouth Rinse q12n4p  . metoprolol tartrate  100 mg Oral BID  . multivitamin with minerals  1 tablet Oral Daily  . nicotine  7 mg Transdermal Daily  . pantoprazole  40 mg Oral BID  . QUEtiapine  150 mg Oral QHS  . sodium chloride flush  10-40 mL Intracatheter Q12H  . thiamine  100 mg Oral Daily    Continuous Infusions: . sodium chloride 10 mL/hr at 09/17/20 1400    PRN Meds: acetaminophen, albuterol, alum & mag hydroxide-simeth **AND** [COMPLETED] lidocaine, docusate, LORazepam, metoprolol tartrate, ondansetron (ZOFRAN) IV, polyethylene glycol, Resource ThickenUp Clear, sodium chloride flush   IMPRESSION & RECOMMENDATIONS: Karen Casey is a 65 y.o. female whose past medical history and cardiac risk factors include: Persistent atrial fibrillation, anxiety and depression with chronic Xanax use, alcohol abuse, tobacco dependence with 43-pack-year history.  Persistent atrial fibrillation: Patient remains in atrial fibrillation but ventricular rate is better controlled. Agree with up titration of metoprolol. Currently on Eliquis for thromboembolic prophylaxis as she is in persistent atrial fibrillation.  Would recommend approximately 4 weeks of treatment to see if she spontaneously converts otherwise we will discuss direct-current cardioversion.  If direct-current cardioversion is pursued she will require additional 4 weeks of anticoagulation.  With regards to chronic use of oral anticoagulation in the setting of her low to moderate CHA2DS2-VASc score shared decision will need to be made between the  patient, family, and provider. Morning EKG pending  Long-term oral anticoagulation: Currently on Eliquis for thromboembolic prophylaxis. Discussed the risks, benefits, and alternatives to anticoagulation with her daughter Estill Bamberg who is present at bedside.  Both the patient and her daughter are in agreement with being on anticoagulation for now until she restored normal sinus rhythm.  Hemoglobin remained stable  Chest pain: Resolved  Shortness of breath: Improving.  Continue Lasix.  We will defer management of her other chronic comorbid conditions as well as electrolyte abnormalities such as hyponatremia to the primary team.  Their efforts and management greatly appreciated.  Patient's  questions and concerns were addressed to her satisfaction. She voices understanding of the instructions provided during this encounter.   This note was created using a voice recognition software as a result there may be grammatical errors inadvertently enclosed that do not reflect the nature of this encounter. Every attempt is made to correct such errors.  Rex Kras, DO, [facility] [place] Office: [phone] 09/18/2020, 8:01 AM

## 2020-09-18 NOTE — Progress Notes (Signed)
Physical Therapy Treatment Patient Details Name: Karen Casey MRN: [id number] DOB: Oct 16, 1955 Today's Date: 09/18/2020    History of Present Illness 65 yo admitted 3/29 after found with AMS and covered in feces at home. Pt was taken for head CT during which she had a Sz and required intubation 3/29. Extubated 4/1. PMhx: ETOH abuse, depression/anxiety, GERD, IBS, HTN, urinary incontinence    PT Comments    Pt asleep on entry requiring increased stimulation, loud voices, sternal rub and increased light to wake pt. Once awake pt very lethargic through session. Pure wick malfunction and bed saturated in urine, pt unaware and unconcerned when brought to her attention. With increased cuing and min A pt able to get to EoB, transfer to standing with min A and ambulate 5 feet with min A. Once pt in bed, pt able to perform flutter valve and IS with increased verbal cuing. Pt able to teach back use 10x per hour. D/c plans remain appropriate at this time. PT will continue to follow acutely.    Follow Up Recommendations  Home health PT;Supervision for mobility/OOB     Equipment Recommendations  Rolling walker with 5" wheels;3in1 (PT)    Recommendations for Other Services OT consult     Precautions / Restrictions Precautions Precautions: Fall Precaution Comments: watch sats Restrictions Weight Bearing Restrictions: No    Mobility  Bed Mobility Overal bed mobility: Needs Assistance Bed Mobility: Supine to Sit;Sit to Supine     Supine to sit: Min assist;HOB elevated     General bed mobility comments: pt able to navigate LE off bed, with heavy use of bed rail able to pull herself to the EoB, however requires min A for bringing trunk to upright    Transfers Overall transfer level: Needs assistance Equipment used: Rolling walker (2 wheeled) Transfers: Sit to/from Stand Sit to Stand: Min assist         General transfer comment: min A for coming to full upright and for balancing in  standing  Ambulation/Gait Ambulation/Gait assistance: Min assist;+2 safety/equipment Gait Distance (Feet): 5 Feet Assistive device: Rolling walker (2 wheeled) Gait Pattern/deviations: Step-to pattern;Decreased step length - right;Decreased step length - left Gait velocity: decr Gait velocity interpretation: <1.31 ft/sec, indicative of household ambulator General Gait Details: minA for stepping to recliner from bed, minimal vc for proximity to RW and upright posture         Balance Overall balance assessment: History of Falls;Needs assistance Sitting-balance support: No upper extremity supported;Feet supported Sitting balance-Leahy Scale: Fair     Standing balance support: Bilateral upper extremity supported Standing balance-Leahy Scale: Poor Standing balance comment: walker and light min A for static standing                            Cognition Arousal/Alertness: Lethargic Behavior During Therapy: Impulsive Overall Cognitive Status: Impaired/Different from baseline Area of Impairment: Memory;Safety/judgement;Problem solving;Awareness                     Memory: Decreased short-term memory   Safety/Judgement: Decreased awareness of safety;Decreased awareness of deficits Awareness: Emergent Problem Solving: Requires verbal cues;Requires tactile cues;Slow processing General Comments: Pt sitting in puddle of urine as Purewick canister completely full, when pt asked if she knew she was wet shewas not aware      Exercises Other Exercises Other Exercises: flutter valve x 10 Other Exercises: IS x 10, max inhalation 625 mL    General Comments General  comments (skin integrity, edema, etc.): VSS on 3L O2 via [place]      Pertinent Vitals/Pain Pain Assessment: Faces Faces Pain Scale: Hurts little more Pain Location: lower back Pain Descriptors / Indicators: Aching Pain Intervention(s): Limited activity within patient's tolerance;Monitored during  session;Repositioned           PT Goals (current goals can now be found in the care plan section) Acute Rehab PT Goals Patient Stated Goal: return home and read PT Goal Formulation: With patient Time For Goal Achievement: 09/27/20 Potential to Achieve Goals: Fair Progress towards PT goals: Not progressing toward goals - comment (very lethargic today, unable to safely progress gait)    Frequency    Min 3X/week      PT Plan Current plan remains appropriate       AM-PAC PT "6 Clicks" Mobility   Outcome Measure  Help needed turning from your back to your side while in a flat bed without using bedrails?: A Little Help needed moving from lying on your back to sitting on the side of a flat bed without using bedrails?: A Little Help needed moving to and from a bed to a chair (including a wheelchair)?: A Little Help needed standing up from a chair using your arms (e.g., wheelchair or bedside chair)?: A Little Help needed to walk in hospital room?: A Little Help needed climbing 3-5 steps with a railing? : Total 6 Click Score: 16    End of Session Equipment Utilized During Treatment: Gait belt;Oxygen Activity Tolerance: Patient tolerated treatment well Patient left: with call bell/phone within reach;in chair;with chair alarm set Nurse Communication: Mobility status;Other (comment) (need for bath) PT Visit Diagnosis: Other abnormalities of gait and mobility (R26.89);Difficulty in walking, not elsewhere classified (R26.2)     Time: 9794-8016 PT Time Calculation (min) (ACUTE ONLY): 26 min  Charges:  $Gait Training: 8-22 mins $Therapeutic Exercise: 8-22 mins                     Clayvon Parlett B. Migdalia Dk PT, DPT Acute Rehabilitation Services Pager [phone] Office [phone]    [place] 09/18/2020, 3:00 PM

## 2020-09-18 NOTE — Progress Notes (Signed)
Pt arrived on Unit around 2045. Pt Vitals were taken and Pt placed on ECG monitoring. Pt on 3LNC.  Pt A&Ox4. Pt has cell phone at bedside. Skin check completed. Pt complains of 8/10 lower back pain. Bath/CHG call bell at bedside.

## 2020-09-18 NOTE — Progress Notes (Signed)
Family Medicine Teaching Service Daily Progress Note Intern Pager: [phone]  Patient name: Karen Casey Medical record number: [id number] Date of birth: [birth year] Age: 65 y.o. Gender: female  Primary Care Provider: [name], MD Consultants: CCM (s/o), cards Code Status: Full  Pt Overview and Major Events to Date:  3/29 Seizure, intubated, admitted to ICU 3/30 Precedex started 4/01 Extubated 4/03 Off Precedex (last 4/2 @2100 ) 4/04 Transferred to care of [facility] 4/05 Transferred to care of [facility] 4/06 Transferred from ICU to progressive floor  Assessment and Plan: Karen Casey is a 65 year old female who presents to the ED with AMS, agitation was found to be in alcohol and benzo withdrawal.  Had a seizure, was intubated, admitted to the ICU.  CCM transitioned to family medicine team 4/04.  PMH includes depression, anxiety, chronic Xanax use (history of seizure from withdrawal in 2013), EtOH abuse (drinks 6-10 beers a day), tobacco abuse (43 pack years).   Acute hypoxic respiratory failure: Improving Overnight on 2 L oxygen with SPO2 greater than 94%.  Feels like her breathing is doing much better this morning.  Still has productive cough, unsure of color of sputum.  Productive cough slowing down. -Continue supplemental O2 with SPO2 goal >94% -Wean O2 as tolerated -Albuterol neb as needed -Lasix 20 mg IV daily   Dysphagia  heartburn SLP recommends dysphagia 2 diet. -Continue aspiration precautions -GI cocktail daily as needed -Tums as needed -Protonix twice daily  New onset atrial fibrillation Electrolyte goals her potassium >4, magnesium >2.  Cardiology stopped diltiazem drip and transitioned to metoprolol tartrate 100 mg twice daily.  Cards also transitioned from heparin to Eliquis for anticoagulation. -Continue metoprolol per cardiology -Continue Eliquis per cardiology   Hyponatremia Sodium 128 this morning, down  from 136 previously.  Has been measuring in low 130s.  Not on maintenance IVF. Was on Cymbalta previously without issue or previously documented hyponatremia.  Euglycemic. -Monitor closely -A.m. BMP  ETOH/benzo withdrawal No seizures overnight, no CIWA scores recorded last 24 hours. -Seizure precautions -D/C CIWA protocol, as out of withdrawal period -Continue thiamine, folate -Xanax 2 mg twice daily  Hypokalemia Potassium goal given new onset atrial fibrillation is >4.   Potassium 4.0 this morning, at goal.  No repletion at this time. -A.m. BMP   Hypomagnesemia Magnesium goal given new onset atrial fibrillation is >2.  Magnesium 1.7 this morning, will replete with 2 g IV. -A.m. magnesium   Chronic anxiety Home meds include Celexa, gabapentin, Abilify, Seroquel, Xanax, Cymbalta.  Per neurology, providing Xanax 2 mg twice daily with long-term goal to taper down. -Continue Abilify, Celexa, Cymbalta, gabapentin, Seroquel -Low threshold to stop or reduce medications if patient becomes sleepy somnolent or altered   Long-term tobacco use Continue nicotine patch.    FEN/GI: Dysphagia 2 diet PPx: Lovenox  Status is: Inpatient  Remains inpatient appropriate because:Altered mental status, IV treatments appropriate due to intensity of illness or inability to take PO and Inpatient level of care appropriate due to severity of illness   Dispo:  Patient From: Home  Planned Disposition: Home with [facility]  Medically stable for discharge: No      Subjective:  Karen Casey was found lying in bed awake this morning with daughter Karen Casey at bedside. Reports feeling better. Believes her breathing was improved.   Objective: Temp:  [97.6 F (36.4 C)-99.2 F (37.3 C)] 98.7 F (37.1 C) (04/07 0749) Pulse Rate:  [76-120] 98 (04/07 0749) Resp:  [14-39]  20 (04/07 0749) BP: (98-176)/(69-146) 130/81 (04/07 0749) SpO2:  [87 %-98 %] 95 % (04/07 0749) FiO2 (%):  [32 %] 32 % (04/07  0736) Physical Exam: General: awake, alert, no acute distress Cardiovascular: irregularly irregular rhythm, rate 110s, no murmur Respiratory: wheezing in anterior superior fields, mild crackling in posterior inferior fields Abdomen: non-tender Extremities: no BLE edema  Laboratory: Recent Labs  Lab 09/16/20 0352 09/17/20 0208 09/18/20 0450  WBC 8.0 9.6 10.7*  HGB 11.8* 12.1 12.5  HCT 37.1 38.1 38.6  PLT 216 236 271   Recent Labs  Lab 09/16/20 0352 09/17/20 0208 09/18/20 0450  NA 135 136 128*  K 3.7 4.0 4.0  CL 89* 93* 90*  CO2 41* 38* 34*  BUN <5* 5* 5*  CREATININE 0.50 0.44 0.47  CALCIUM 8.6* 8.9 8.9  PROT  --  5.7*  --   BILITOT  --  0.4  --   ALKPHOS  --  74  --   ALT  --  20  --   AST  --  19  --   GLUCOSE 108* 110* 103*    Imaging/Diagnostic Tests: None last 24 hours.   Ezequiel Essex, MD 09/18/2020, 8:02 AM PGY-1, [place] Intern pager: [phone], text pages welcome

## 2020-09-18 NOTE — TOC Benefit Eligibility Note (Signed)
Patient Teacher, English as a foreign language completed.    The patient is currently admitted and upon discharge could be taking Eliquis 5 mg.  The current 30 day co-pay is, $200.00.   Has a $5,000.00 deductible  The patient is insured through [place], [place] Patient Advocate Specialist [place] Team Direct Number: [phone]  Fax: [phone]

## 2020-09-19 ENCOUNTER — Other Ambulatory Visit (HOSPITAL_COMMUNITY): Payer: Self-pay

## 2020-09-19 LAB — CBC
HCT: 38.6 % (ref 36.0–46.0)
Hemoglobin: 12.7 g/dL (ref 12.0–15.0)
MCH: 33.1 pg (ref 26.0–34.0)
MCHC: 32.9 g/dL (ref 30.0–36.0)
MCV: 100.5 fL — ABNORMAL HIGH (ref 80.0–100.0)
Platelets: 271 10*3/uL (ref 150–400)
RBC: 3.84 MIL/uL — ABNORMAL LOW (ref 3.87–5.11)
RDW: 12.7 % (ref 11.5–15.5)
WBC: 9.7 10*3/uL (ref 4.0–10.5)
nRBC: 0 % (ref 0.0–0.2)

## 2020-09-19 LAB — BASIC METABOLIC PANEL
Anion gap: 8 (ref 5–15)
BUN: 7 mg/dL — ABNORMAL LOW (ref 8–23)
CO2: 34 mmol/L — ABNORMAL HIGH (ref 22–32)
Calcium: 9.1 mg/dL (ref 8.9–10.3)
Chloride: 88 mmol/L — ABNORMAL LOW (ref 98–111)
Creatinine, Ser: 0.63 mg/dL (ref 0.44–1.00)
GFR, Estimated: >60 mL/min (ref >60)
Glucose, Bld: 98 mg/dL (ref 70–99)
Potassium: 4.1 mmol/L (ref 3.5–5.1)
Sodium: 130 mmol/L — ABNORMAL LOW (ref 135–145)

## 2020-09-19 LAB — MAGNESIUM: Magnesium: 1.9 mg/dL (ref 1.7–2.4)

## 2020-09-19 MED ORDER — MAGNESIUM SULFATE 2 GM/50ML IV SOLN
2.0000 g | Freq: Once | INTRAVENOUS | Status: AC
  Administered 2020-09-19: 2 g via INTRAVENOUS
  Filled 2020-09-19: qty 50

## 2020-09-19 MED ORDER — APIXABAN 5 MG PO TABS
5.0000 mg | ORAL_TABLET | Freq: Two times a day (BID) | ORAL | 0 refills | Status: DC
  Filled 2020-09-19: qty 60, 30d supply, fill #0

## 2020-09-19 MED ORDER — ALPRAZOLAM 1 MG PO TABS
2.0000 mg | ORAL_TABLET | Freq: Two times a day (BID) | ORAL | 0 refills | Status: DC
Start: 2020-09-19 — End: 2020-10-31
  Filled 2020-09-19: qty 56, 14d supply, fill #0

## 2020-09-19 MED ORDER — METOPROLOL TARTRATE 100 MG PO TABS
100.0000 mg | ORAL_TABLET | Freq: Two times a day (BID) | ORAL | 0 refills | Status: DC
  Filled 2020-09-19: qty 60, 30d supply, fill #0

## 2020-09-19 NOTE — Plan of Care (Signed)
  Problem: Education: Goal: Knowledge of disease or condition will improve Outcome: Progressing Goal: Understanding of discharge needs will improve Outcome: Progressing   Problem: Health Behavior/Discharge Planning: Goal: Ability to identify changes in lifestyle to reduce recurrence of condition will improve Outcome: Progressing Goal: Identification of resources available to assist in meeting health care needs will improve Outcome: Progressing   Problem: Physical Regulation: Goal: Complications related to the disease process, condition or treatment will be avoided or minimized Outcome: Progressing   Problem: Safety: Goal: Ability to remain free from injury will improve Outcome: Progressing   Problem: Education: Goal: Knowledge of General Education information will improve Description: Including pain rating scale, medication(s)/side effects and non-pharmacologic comfort measures Outcome: Progressing   Problem: Health Behavior/Discharge Planning: Goal: Ability to manage health-related needs will improve Outcome: Progressing   Problem: Clinical Measurements: Goal: Ability to maintain clinical measurements within normal limits will improve Outcome: Progressing Goal: Will remain free from infection Outcome: Progressing Goal: Diagnostic test results will improve Outcome: Progressing Goal: Respiratory complications will improve Outcome: Progressing Goal: Cardiovascular complication will be avoided Outcome: Progressing   Problem: Coping: Goal: Level of anxiety will decrease Outcome: Progressing   Problem: Nutrition: Goal: Adequate nutrition will be maintained Outcome: Progressing   Problem: Activity: Goal: Risk for activity intolerance will decrease Outcome: Progressing   Problem: Elimination: Goal: Will not experience complications related to bowel motility Outcome: Progressing Goal: Will not experience complications related to urinary retention Outcome:  Progressing   Problem: Pain Managment: Goal: General experience of comfort will improve Outcome: Progressing   Problem: Safety: Goal: Ability to remain free from injury will improve Outcome: Progressing   Problem: Skin Integrity: Goal: Risk for impaired skin integrity will decrease Outcome: Progressing

## 2020-09-19 NOTE — Progress Notes (Signed)
Nutrition Follow-up  DOCUMENTATION CODES:   Obesity unspecified  INTERVENTION:    Chocolate Magic cup TID with meals, each supplement provides 290 kcal and 9 grams of protein  Continue to offer Ensure Enlive po TID, each supplement provides 350 kcal and 20 grams of protein  NUTRITION DIAGNOSIS:   Inadequate oral intake related to lethargy/confusion,dysphagia as evidenced by meal completion < 25%.  Ongoing   GOAL:   Patient will meet greater than or equal to 90% of their needs  Progressing   MONITOR:   PO intake,Supplement acceptance,Diet advancement,Labs,Weight trends  REASON FOR ASSESSMENT:   Ventilator,Consult Enteral/tube feeding initiation and management  ASSESSMENT:   65 year old female who presented to the ED on 3/29 with AMS. PMH of EtOH abuse (approximately 6-10 beers per day), depression, anxiety, tobacco abuse. Pt admitted with seizure and respiratory failure.  S/P SLP swallow evaluation this morning. Diet advanced to regular with thin liquids. Patient glad to be able to eat regular textures. Cold Ensure provided per patient request. She also likes ice cream. No longer receiving Magic cup supplements with diet upgrade. Will add magic cup TID with meals.   Labs reviewed. Na 130  Medications reviewed and include calcium carbonate, folic acid, Lasix, MVI with minerals, thiamine. Received mag sulfate x 1 this AM.   Weight was up to 97.9 kg 4/2, now trending back down, currently 92.2 kg.  Patient has non pitting edema to BUE per RN assessment.  Diet Order:   Diet Order            Diet regular Room service appropriate? Yes with Assist; Fluid consistency: Thin  Diet effective now                 EDUCATION NEEDS:   No education needs have been identified at this time  Skin:  Skin Assessment: Reviewed RN Assessment  Last BM:  4/6 type 6  Height:   Ht Readings from Last 1 Encounters:  09/09/20 5\' 6"  (1.676 m)    Weight:   Wt Readings from Last  1 Encounters:  09/17/20 92.2 kg    BMI:  Body mass index is 32.81 kg/m.  Estimated Nutritional Needs:   Kcal:  1700-1900  Protein:  85-100 grams  Fluid:  1.7-1.9 L    Lucas Mallow, RD, LDN, CNSC Please refer to Amion for contact information.

## 2020-09-19 NOTE — Progress Notes (Signed)
Progress Note  Patient Name: Karen Casey Date of Encounter: 09/19/2020  Attending physician: McDiarmid, Blane Ohara, MD Primary care provider: [name], MD Consultant:Brevin Mcfadden Royston Sinner, PA-C  Subjective: Karen Casey is a 65 y.o. female who was seen and examined at bedside. No family present at bedside.  Patient denies chest pain at rest or with effort related activities. Does report shortness of breath with rest, but not with exertion. No shortness of breath with physical therapy session yesterday.  Review of telemetry revealed patient converted from atrial fibrillation to sinus rhythm at about 11:40AM yesterday. Patient has maintained sinus rhythm since then with rate in 80s bpm.  Remains on supplemental oxygen via nasal canula 3L/min Denies dizziness, lightheadedness, near-syncope, syncope.  Objective: Vital Signs in the last 24 hours: Temp:  [97.4 F (36.3 C)-99 F (37.2 C)] 98.2 F (36.8 C) (04/08 0727) Pulse Rate:  [61-88] 84 (04/08 0727) Resp:  [16-23] 20 (04/08 0727) BP: (90-132)/(57-83) 95/76 (04/08 0727) SpO2:  [95 %-100 %] 95 % (04/08 0727) FiO2 (%):  [32 %] 32 % (04/07 1351)  Intake/Output:  Intake/Output Summary (Last 24 hours) at 09/19/2020 0803 Last data filed at 09/18/2020 2140 Gross per 24 hour  Intake 620 ml  Output 1850 ml  Net -1230 ml    Net IO Since Admission: 3,772.4 mL [09/19/20 0803]  Weights:  Filed Weights   09/15/20 0459 09/16/20 0500 09/17/20 0500  Weight: 96.7 kg 96.9 kg 92.2 kg    Telemetry: Personally reviewed.  Atrial fibrillation converted to normal sinus rhythm 09/18/20 at 11:40 AM  Physical examination: PHYSICAL EXAM: Vitals with BMI 09/19/2020 09/19/2020 09/19/2020  Height - - -  Weight - - -  BMI - - -  Systolic 95 90 409  Diastolic 76 63 57  Pulse 84 61 74  Some encounter information is confidential and restricted. Go to Review Flowsheets activity to see all data.   CONSTITUTIONAL: Appears older than stated age,  hemodynamically stable, no acute distress    SKIN: Skin is warm and dry. No rash noted. No cyanosis. No pallor. No jaundice  HEAD: Normocephalic and atraumatic.   EYES: No scleral icterus  MOUTH/THROAT: Moist oral membranes.   NECK: No JVD present. No thyromegaly noted. No carotid bruits   LYMPHATIC: No visible cervical adenopathy.   CHEST Normal respiratory effort. No intercostal retractions   LUNGS: Bilateral expiratory wheezes noted.    CARDIOVASCULAR: Regular rate and rhythm, no murmurs rubs or gallops appreciated   ABDOMINAL: No apparent ascites.   EXTREMITIES: No peripheral edema   HEMATOLOGIC: No significant bruising  NEUROLOGIC: Oriented to person, place, and time. Nonfocal. Normal muscle tone.   PSYCHIATRIC: Normal mood and affect. Normal behavior. Cooperative   Lab Results: Hematology Recent Labs  Lab 09/17/20 0208 09/18/20 0450 09/19/20 0404  WBC 9.6 10.7* 9.7  RBC 3.68* 3.80* 3.84*  HGB 12.1 12.5 12.7  HCT 38.1 38.6 38.6  MCV 103.5* 101.6* 100.5*  MCH 32.9 32.9 33.1  MCHC 31.8 32.4 32.9  RDW 12.9 12.7 12.7  PLT 236 271 271    Chemistry Recent Labs  Lab 09/17/20 0208 09/18/20 0450 09/19/20 0404  NA 136 128* 130*  K 4.0 4.0 4.1  CL 93* 90* 88*  CO2 38* 34* 34*  GLUCOSE 110* 103* 98  BUN 5* 5* 7*  CREATININE 0.44 0.47 0.63  CALCIUM 8.9 8.9 9.1  PROT 5.7*  --   --   ALBUMIN 2.4*  --   --   AST 19  --   --  ALT 20  --   --   ALKPHOS 74  --   --   BILITOT 0.4  --   --   GFRNONAA >60 >60 >60  ANIONGAP 5 4* 8     Cardiac Enzymes: Cardiac Panel (last 3 results) Recent Labs    09/16/20 1725 09/16/20 1924  TROPONINIHS 8 9    BNP (last 3 results) Recent Labs    09/15/20 1624  BNP 684.3*    ProBNP (last 3 results) No results for input(s): PROBNP in the last 8760 hours.   DDimer No results for input(s): DDIMER in the last 168 hours.   Hemoglobin A1c:  Lab Results  Component Value Date   HGBA1C 5.2 09/17/2020   MPG 102.54 09/17/2020     TSH  Recent Labs    09/12/20 0949  TSH 0.827    Lipid Panel  Lab Results  Component Value Date   CHOL 97 09/17/2020   HDL 41 09/17/2020   LDLCALC 42 09/17/2020   LDLDIRECT 58 12/18/2013   TRIG 68 09/17/2020   CHOLHDL 2.4 09/17/2020   Imaging: No results found.  Cardiac database: EKG:  09/09/2020: Atrial fibrillation with rapid ventricular response at a rate of 156 bpm. Normal axis. 09/15/2020: Atrial fibrillation with rapid ventricular response at a rate of 119 bpm. Normal axis.  Echocardiogram: 09/10/2020: 1. Left ventricular ejection fraction, by estimation, is 60 to 65%. The left ventricle has normal function. The left ventricle has no regional wall motion abnormalities. There is mild left ventricular hypertrophy. Left ventricular diastolic parameters were normal.  2. Right ventricular systolic function is normal. The right ventricular size is normal. There is normal pulmonary artery systolic pressure.  3. The mitral valve is normal in structure. Trivial mitral valve regurgitation. No evidence of mitral stenosis.  4. The aortic valve is tricuspid. Aortic valve regurgitation is not visualized. No aortic stenosis is present.  5. The inferior vena cava is normal in size with greater than 50% respiratory variability, suggesting right atrial pressure of 3 mmHg.    Scheduled Meds: . ALPRAZolam  2 mg Oral BID  . apixaban  5 mg Oral BID  . ARIPiprazole  5 mg Oral Daily  . calcium carbonate  1 tablet Oral Daily  . chlorhexidine  15 mL Mouth Rinse BID  . Chlorhexidine Gluconate Cloth  6 each Topical Daily  . citalopram  40 mg Oral Daily  . DULoxetine  60 mg Oral Daily  . feeding supplement  237 mL Oral TID BM  . folic acid  1 mg Oral Daily  . furosemide  20 mg Intravenous Daily  . gabapentin  1,200 mg Oral TID  . Gerhardt's butt cream   Topical QID  . ipratropium-albuterol  3 mL Nebulization TID  . mouth rinse  15 mL Mouth Rinse q12n4p  . metoprolol tartrate  100  mg Oral BID  . multivitamin with minerals  1 tablet Oral Daily  . nicotine  7 mg Transdermal Daily  . pantoprazole  40 mg Oral BID  . QUEtiapine  150 mg Oral QHS  . sodium chloride flush  10-40 mL Intracatheter Q12H  . thiamine  100 mg Oral Daily    Continuous Infusions: . sodium chloride 10 mL/hr at 09/17/20 1400    PRN Meds: acetaminophen, albuterol, alum & mag hydroxide-simeth **AND** [COMPLETED] lidocaine, docusate, LORazepam, metoprolol tartrate, ondansetron (ZOFRAN) IV, polyethylene glycol, Resource ThickenUp Clear, sodium chloride flush   IMPRESSION & RECOMMENDATIONS: Karen Casey is a 65 y.o. female whose  past medical history and cardiac risk factors include: Persistent atrial fibrillation, anxiety and depression with chronic Xanax use, alcohol abuse, tobacco dependence with 43-pack-year history.  Persistent atrial fibrillation: Patient converted to normal sinus rhythm 09/18/2020 at 11:40AM. She has maintained sinus rhythm since then.  Presently on Eliquis for thromboembolic prophylaxis. In view of persistent atrial fibrillation >7 days recommend 4 weeks of anticoagulation post discharge following spontaneous conversion to sinus rhythm for thromboembolic prophylaxis. Given patient's CHA2DS2-VASc score of 1, anticoagulation longer than 4 weeks will be included in shared decision making process in outpatient follow up. Notably patient's blood pressure is soft, she is asymptomatic. Continue Lopressor 100 mg PO BID.   Long-term oral anticoagulation: Currently on Eliquis for thromboembolic prophylaxis. Which has been discussed with patient and her daughter who are in agreement with being on anticoagulation. Will manage per above: continue Eliquis for 4 weeks and determine further duration of anticoagulation via shared decision making in outpatient setting.  Hemoglobin is stable.   Chest pain: Resolved  Shortness of breath: Improving.  Discontinue Lasix.  Will defer management of  patient's other chronic comorbidities to primary team. Will plan to follow patient on outpatient basis. Please let us know if any further questions develop during the care of this patient.   Patient's questions and concerns were addressed to her satisfaction. She voices understanding of the instructions provided during this encounter.   Patient was seen in collaboration with Dr. Terri Skains. He also reviewed patient's chart and examined the patient. Dr. Terri Skains is in agreement of the plan.     Alethia Berthold, PA-C 09/19/2020, 8:16 AM Office: [phone]

## 2020-09-19 NOTE — Progress Notes (Signed)
  Speech Language Pathology Treatment: Dysphagia  Patient Details Name: Karen Casey MRN: [id number] DOB: 1955-07-03 Today's Date: 09/19/2020 Time: 0626-9485 SLP Time Calculation (min) (ACUTE ONLY): 18.67 min  Assessment / Plan / Recommendation Clinical Impression  Pt was seen for dysphagia treatment and she was cooperative throughout the session. Pt, nursing, and her sister reported that the pt has been tolerating the current diet without overt s/sx of aspiration. Pt was seen during breakfast. She tolerated puree solids, dysphagia 3 solids, trials of regular texture solids, and consecutive swallows of thin liquids via cup and straw without symptoms of oropharyngeal dysphagia. Pt consistently used a posterior head tilt when consuming pills which were given by Denyse Amass, RN. Despite pt's reclined position, posterior head tilt and intake of up to five varied-size pills with thin liquids, no s/sx of aspiration were noted. Pt's dysphagia appears to have resolved. Her diet will be advanced to regular texture solids and use of straw will be allowed. SLP will see pt once more to ensure tolerance of the advanced diet, but it is anticipated that further skilled SLP services will not be clinically indicated thereafter.    HPI HPI: Pt is a 65 year old female with PMHx significant for depression/anxiety (chronic Xanax use with history of seizure 2/2 withdrawal 2013), EtOH abuse (drinks estimated 6-10 beers/day) and tobacco abuse who presented to [facility] ED on 3/29 after being found altered at home. On admission pt's family reported periods of cyclical vomiting that last up to a week. Pt had witnessed seizure in CT (non-focal shaking movements, 3-5 minutes long per CT staff). Pt received a total of 4mg  Ativan and, upon return to ED was minimally responsive (likely post-ictal) and not protecting her airway. ETT 3/29-4/1 [id number]). CXR 3/31: Progressive left basilar consolidation. Progressive pulmonary hypoinflation; BSE  completed on 09/12/20 with PO readiness goals initiated.      SLP Plan  Continue with current plan of care       Recommendations  Diet recommendations: Regular;Thin liquid Liquids provided via: Cup;Straw Medication Administration: Whole meds with puree Supervision: Patient able to self feed;Staff to assist with self feeding;Full supervision/cueing for compensatory strategies Compensations: Slow rate;Small sips/bites (rest breaks if dyspneic) Postural Changes and/or Swallow Maneuvers: Seated upright 90 degrees                Oral Care Recommendations: Oral care BID Follow up Recommendations:  (TBD) SLP Visit Diagnosis: Dysphagia, oropharyngeal phase (R13.12) Plan: Continue with current plan of care       Laranda Burkemper I. Hardin Negus, [place], [place] Office number [phone] Pager [place] 09/19/2020, 9:39 AM

## 2020-09-19 NOTE — Progress Notes (Signed)
Physical Therapy Treatment Patient Details Name: Karen Casey MRN: [id number] DOB: 1956/04/16 Today's Date: 09/19/2020    History of Present Illness 65 yo admitted 3/29 after found with AMS and covered in feces at home. Pt was taken for head CT during which she had a Sz and required intubation 3/29. Extubated 4/1. PMhx: ETOH abuse, depression/anxiety, GERD, IBS, HTN, urinary incontinence    PT Comments    Pt in bed and agreeable to participate with therapy. She performed bed level exercised on RA with SpO2 remaining ~90%. Once seated EOB SpO2 dropped to 86% and [place] was reapplied at 3L prior to SPT to recliner chair. In recliner chair pt performed repeat sit<>stand for LE strengthening. Plan for +2 next session to progress gait. Will continue to follow acutely.    Follow Up Recommendations  Home health PT;Supervision for mobility/OOB     Equipment Recommendations  Rolling walker with 5" wheels;3in1 (PT)    Recommendations for Other Services OT consult     Precautions / Restrictions Precautions Precautions: Fall Precaution Comments: watch sats Restrictions Weight Bearing Restrictions: No    Mobility  Bed Mobility Overal bed mobility: Needs Assistance Bed Mobility: Supine to Sit     Supine to sit: Min assist;HOB elevated     General bed mobility comments: HHA to pull up on only. Pt able to elevate trunk and progress LEs w/o assist.    Transfers Overall transfer level: Needs assistance Equipment used: Rolling walker (2 wheeled) Transfers: Sit to/from Stand Sit to Stand: Min assist Stand pivot transfers: Min assist       General transfer comment: min A to rise with moment of mod A to steady as pt became suddenly dizzy with posterior LOB. Repeat and frequenc cues required for hand placement.  Ambulation/Gait                 Stairs             Wheelchair Mobility    Modified Rankin (Stroke Patients Only)       Balance Overall balance  assessment: History of Falls;Needs assistance Sitting-balance support: No upper extremity supported;Feet supported Sitting balance-Leahy Scale: Fair     Standing balance support: Bilateral upper extremity supported Standing balance-Leahy Scale: Poor Standing balance comment: walker and light min A for static standing                            Cognition Arousal/Alertness: Awake/alert Behavior During Therapy: WFL for tasks assessed/performed Overall Cognitive Status: Impaired/Different from baseline Area of Impairment: Memory;Safety/judgement;Problem solving;Awareness                     Memory: Decreased short-term memory   Safety/Judgement: Decreased awareness of safety;Decreased awareness of deficits Awareness: Emergent Problem Solving: Requires verbal cues;Requires tactile cues;Slow processing General Comments: Doing better cognitively today. Some deficits remain      Exercises General Exercises - Lower Extremity Ankle Circles/Pumps: AROM;Both;10 reps;Supine Short Arc Quad: AROM;Both;10 reps;Supine Heel Slides: AROM;Both;10 reps;Supine Hip ABduction/ADduction: AROM;Both;10 reps;Supine Straight Leg Raises: AROM;Both;10 reps;Supine    General Comments General comments (skin integrity, edema, etc.): on RA for LE ther ex. SpO2 remaining ~90 with momentary dips to 88%. Once seated EOB SpO2 dropped to 86% and placed back on 3L O2.      Pertinent Vitals/Pain Pain Assessment: 0-10 Pain Score: 8  Faces Pain Scale: No hurt Pain Location: lower back, stomach Pain Descriptors / Indicators: Aching Pain Intervention(s): Monitored  during session;Limited activity within patient's tolerance;Repositioned;Patient requesting pain meds-RN notified    Home Living                      Prior Function            PT Goals (current goals can now be found in the care plan section) Acute Rehab PT Goals Patient Stated Goal: return home and read PT Goal  Formulation: With patient Time For Goal Achievement: 09/27/20 Potential to Achieve Goals: Fair Progress towards PT goals: Progressing toward goals    Frequency    Min 3X/week      PT Plan Current plan remains appropriate    Co-evaluation              AM-PAC PT "6 Clicks" Mobility   Outcome Measure  Help needed turning from your back to your side while in a flat bed without using bedrails?: A Little Help needed moving from lying on your back to sitting on the side of a flat bed without using bedrails?: A Little Help needed moving to and from a bed to a chair (including a wheelchair)?: A Little Help needed standing up from a chair using your arms (e.g., wheelchair or bedside chair)?: A Little Help needed to walk in hospital room?: A Little Help needed climbing 3-5 steps with a railing? : Total 6 Click Score: 16    End of Session Equipment Utilized During Treatment: Gait belt;Oxygen Activity Tolerance: Patient tolerated treatment well Patient left: with call bell/phone within reach;in chair;with family/visitor present Nurse Communication: Mobility status PT Visit Diagnosis: Other abnormalities of gait and mobility (R26.89);Difficulty in walking, not elsewhere classified (R26.2)     Time: 7116-5790 PT Time Calculation (min) (ACUTE ONLY): 30 min  Charges:  $Therapeutic Exercise: 8-22 mins $Therapeutic Activity: 8-22 mins                     Benjiman Core, [place] Pager [phone] Acute Rehab   Allena Katz 09/19/2020, 12:46 PM

## 2020-09-19 NOTE — Progress Notes (Addendum)
Family Medicine Teaching Service Daily Progress Note Intern Pager: [phone]  Patient name: Karen Casey Medical record number: [id number] Date of birth: 12-May-1956 Age: 65 y.o. Gender: female  Primary Care Provider: [name], MD Consultants: CCM (s/o), cards Code Status: Full  Pt Overview and Major Events to Date:  3/29 Seizure, intubated, admitted to ICU 3/30 Precedex started 4/01 Extubated 4/03 Off Precedex (last 4/2 @2100 ) 4/04 Transferred to care of [facility] 4/05 Transferred to care of [place] Team 4/06 Transferred from ICU to progressive floor   Assessment and Plan: [name] is a 65 year old female who presents to the ED with AMS, agitation was found to be in alcohol and benzo withdrawal.  Had a seizure, was intubated, admitted to the ICU.  CCM transitioned to family medicine team 4/04.  PMH includes depression, anxiety, chronic Xanax use (history of seizure from withdrawal in 2013), EtOH abuse (drinks 6-10 beers a day), tobacco abuse (43 pack years).   New onset atrial fibrillation: resolved Electrolyte goals her potassium >4, magnesium >2.  Patient no longer in A. Fib, spontaneously converted. Will appreciate cards recommendations.  -Cardiology consulted, appreciate ongoing care -Continue metoprolol and Eliquis at discharge per cardiology - sent meds to [facility] for meds-to-bed to ensure she has meds in hand when discharging likely this weekend - follow up with cards outpatient  Acute hypoxic respiratory failure: Improving SPO2 >95% on 3 L O2. -Continue supplemental O2 with SPO2 goal >94% -Wean O2 as tolerated -Albuterol neb as needed -Lasix 20 mg IV daily   ETOH/benzo withdrawal Out of withdrawal window.  CIWA protocol discontinued. Per neuro note from time in ICU, plan for Xanax 2mg  BID while inpatient with extended taper schedule with PCP as outpatient. As Xanax has short half life, prefer reducing dose first and keep  timing.  -Continue thiamine, folate -Xanax 2 mg twice daily  Dysphagia  heartburn -SLP change from dysphagia 2 to regular diet -Continue aspiration precautions -GI cocktail daily as needed -Tums as needed -Protonix twice daily   Hyponatremia Sodium 1 3 this morning, improved slightly from 120 yesterday. Has been measuring in low 130s.  Not on maintenance IVF. Was on Cymbalta previously without issue or previously documented hyponatremia.  Euglycemic. -Monitor closely -A.m. BMP   Hypokalemia Potassium goal given new onset atrial fibrillation is >4.   Potassium 4.1 this morning, at goal.  No repletion at this time. -A.m. BMP   Hypomagnesemia Magnesium goal given new onset atrial fibrillation is >2.  Magnesium magnesium 1.9 this morning.  We will replete with 2 g IV. -A.m. magnesium   Chronic anxiety Home meds include Celexa, gabapentin, Abilify, Seroquel, Xanax, Cymbalta.  Per neurology, providing Xanax 2 mg twice daily with long-term goal to taper down. -Continue Abilify, Celexa, Cymbalta, gabapentin, Seroquel -With this.  Reduce if AMS   Long-term tobacco use Continue nicotine patch   FEN/GI: Dysphagia 3 diet PPx: Eliquis   Status is: Inpatient  Remains inpatient appropriate because:Unsafe d/c plan and Inpatient level of care appropriate due to severity of illness   Dispo:  Patient From: Home  Planned Disposition: Home with Health Care Svc  Medically stable for discharge: No      Subjective:  Patient laying in bed comfortably. Reports feeling even better. No complaints. Heart and lungs feel improved.   Objective: Temp:  [97.4 F (36.3 C)-99 F (37.2 C)] 98.7 F (37.1 C) (04/08 1159) Pulse Rate:  [61-88] 71 (04/08 1159) Resp:  [16-20] 20 (04/08 1159) BP: (  90-132)/(57-81) 108/74 (04/08 1159) SpO2:  [95 %-100 %] 96 % (04/08 1159) FiO2 (%):  [32 %] 32 % (04/07 1351) Physical Exam: General: awake, alert, no acute distress Cardiovascular: RRR, no  murmurs Respiratory: mildly wheezey in superior anterior fields Abdomen: non-distended  Laboratory: Recent Labs  Lab 09/17/20 0208 09/18/20 0450 09/19/20 0404  WBC 9.6 10.7* 9.7  HGB 12.1 12.5 12.7  HCT 38.1 38.6 38.6  PLT 236 271 271   Recent Labs  Lab 09/17/20 0208 09/18/20 0450 09/19/20 0404  NA 136 128* 130*  K 4.0 4.0 4.1  CL 93* 90* 88*  CO2 38* 34* 34*  BUN 5* 5* 7*  CREATININE 0.44 0.47 0.63  CALCIUM 8.9 8.9 9.1  PROT 5.7*  --   --   BILITOT 0.4  --   --   ALKPHOS 74  --   --   ALT 20  --   --   AST 19  --   --   GLUCOSE 110* 103* 98    Imaging/Diagnostic Tests: None last 24 hours.   Ezequiel Essex, MD 09/19/2020, 1:03 PM PGY-1, [place] Intern pager: [phone], text pages welcome

## 2020-09-19 NOTE — Plan of Care (Signed)
  Problem: Education: Goal: Knowledge of disease or condition will improve 09/19/2020 2134 by Tobe Sos, RN Outcome: Progressing 09/19/2020 2133 by Tobe Sos, RN Outcome: Progressing Goal: Understanding of discharge needs will improve 09/19/2020 2134 by Tobe Sos, RN Outcome: Progressing 09/19/2020 2133 by Tobe Sos, RN Outcome: Progressing   Problem: Physical Regulation: Goal: Complications related to the disease process, condition or treatment will be avoided or minimized 09/19/2020 2134 by Tobe Sos, RN Outcome: Progressing 09/19/2020 2133 by Tobe Sos, RN Outcome: Progressing

## 2020-09-20 ENCOUNTER — Inpatient Hospital Stay (HOSPITAL_COMMUNITY): Payer: 59

## 2020-09-20 DIAGNOSIS — G40901 Epilepsy, unspecified, not intractable, with status epilepticus: Secondary | ICD-10-CM

## 2020-09-20 DIAGNOSIS — I480 Paroxysmal atrial fibrillation: Secondary | ICD-10-CM

## 2020-09-20 DIAGNOSIS — E871 Hypo-osmolality and hyponatremia: Secondary | ICD-10-CM

## 2020-09-20 LAB — BASIC METABOLIC PANEL
Anion gap: 8 (ref 5–15)
BUN: 8 mg/dL (ref 8–23)
CO2: 35 mmol/L — ABNORMAL HIGH (ref 22–32)
Calcium: 9.1 mg/dL (ref 8.9–10.3)
Chloride: 89 mmol/L — ABNORMAL LOW (ref 98–111)
Creatinine, Ser: 0.6 mg/dL (ref 0.44–1.00)
GFR, Estimated: >60 mL/min (ref >60)
Glucose, Bld: 106 mg/dL — ABNORMAL HIGH (ref 70–99)
Potassium: 4.1 mmol/L (ref 3.5–5.1)
Sodium: 132 mmol/L — ABNORMAL LOW (ref 135–145)

## 2020-09-20 LAB — CBC
HCT: 38.2 % (ref 36.0–46.0)
Hemoglobin: 12.6 g/dL (ref 12.0–15.0)
MCH: 32.8 pg (ref 26.0–34.0)
MCHC: 33 g/dL (ref 30.0–36.0)
MCV: 99.5 fL (ref 80.0–100.0)
Platelets: 251 10*3/uL (ref 150–400)
RBC: 3.84 MIL/uL — ABNORMAL LOW (ref 3.87–5.11)
RDW: 12.5 % (ref 11.5–15.5)
WBC: 9.8 10*3/uL (ref 4.0–10.5)
nRBC: 0 % (ref 0.0–0.2)

## 2020-09-20 LAB — MAGNESIUM: Magnesium: 2 mg/dL (ref 1.7–2.4)

## 2020-09-20 MED ORDER — UMECLIDINIUM BROMIDE 62.5 MCG/INH IN AEPB
1.0000 | INHALATION_SPRAY | Freq: Every day | RESPIRATORY_TRACT | Status: DC
  Administered 2020-09-21 – 2020-09-22 (×2): 1 via RESPIRATORY_TRACT
  Filled 2020-09-20: qty 7

## 2020-09-20 MED ORDER — FUROSEMIDE 10 MG/ML IJ SOLN
40.0000 mg | Freq: Once | INTRAMUSCULAR | Status: AC
  Administered 2020-09-20: 40 mg via INTRAVENOUS
  Filled 2020-09-20: qty 4

## 2020-09-20 MED ORDER — UMECLIDINIUM BROMIDE 62.5 MCG/INH IN AEPB
1.0000 | INHALATION_SPRAY | Freq: Every day | RESPIRATORY_TRACT | Status: DC
  Filled 2020-09-20: qty 7

## 2020-09-20 MED ORDER — ENSURE ENLIVE PO LIQD
237.0000 mL | Freq: Two times a day (BID) | ORAL | Status: DC
  Administered 2020-09-20 – 2020-09-22 (×4): 237 mL via ORAL

## 2020-09-20 NOTE — Progress Notes (Signed)
Family Medicine Teaching Service Daily Progress Note Intern Pager: [phone]  Patient name: Karen Casey Medical record number: [id number] Date of birth: 12-19-1955 Age: 65 y.o. Gender: female  Primary Care Provider: [name], MD Consultants: CCM, cardiology  Code Status: Full  Pt Overview and Major Events to Date:  3/29 Seizure, intubated, admitted to ICU 3/30 Precedexstarted 4/01 Extubated 4/03 Off Precedex (last 4/2 @2100 ) 4/04 Transferred to care of [facility] 4/05 Transferred to care of [place] Team 4/06 Transferred from ICU to progressive floor  Assessment and Plan: [name] a 65 year old female who presents to the ED with AMS, agitation was found to be in alcohol and benzo withdrawal. Had a seizure, was intubated, admitted to the ICU. CCM transitioned to family medicine team 4/04. PMH includes depression, anxiety, chronic Xanax use (history of seizure from withdrawal in 2013), EtOH abuse (drinks 6-10 beers a day), tobacco abuse (43 pack years).  New onset atrial fibrillation: resolved Patient denies chest pain and dyspnea. Electrolyte goals her potassium >4, magnesium >2.Patient no longer in A. Fib, spontaneously converted.  -Cardiology consulted, appreciate continued involvement and recommendations  -Continue metoprolol per cardiology -Continue Eliquis 5 mg bid, continue on discharge for 4 weeks per cardiology  -meds sent to [facility] 4/8 -cardiology outpatient follow up to discuss possible cardioversion   Acute hypoxic respiratory failure:Improving Currently on 4L O2 satting at 96% with goal of >94%. -Albuterol neb as needed -Lasix discontinued per cardiology  -continue to wean supplemental O2 as tolerated   ETOH/benzo withdrawal Out of withdrawal window.  CIWA protocol discontinued. Per neuro note from time in ICU, plan for Xanax 2mg  BID while inpatient with extended taper schedule with PCP as  outpatient. As Xanax has short half life, prefer reducing dose first and keep timing.  -continue thiamine -continue folate -continue xanax 2 mg bid   Dysphagiaheartburn Per recent SLP evaluation, diet changed from dysphagia 2 to a regular diet. -Continue aspiration precautions -GI cocktail daily as needed -Protonix 40 mg bid   Hyponatremia Improving, Na 132 from 130 from day prior. Previously on Cymbalta without issue or previouslydocumented hyponatremia. Euglycemic. -monitor clinically as well as for mental status changes -monitor BMP  Hypokalemia Resolved. K 4.1, at goal. Potassium goal given new onset atrial fibrillation is>4. -monitor BMP  Hypomagnesemia Magnesium goal given new onset atrial fibrillation is >2. -pending Mg   Chronic anxiety Home meds include Celexa, gabapentin, Abilify, Seroquel, Xanax and Cymbalta. Per neurology, providing Xanax 2 mg twice daily with long-term goal to taper down. -Continue home meds: Abilify, Celexa, Cymbalta, gabapentin, Seroquel -monitor for mental status changes, limit sedating medications   Long-term tobacco use -Continue nicotine patch   FEN/GI: regular PPx: Eliquis    Status is: Inpatient  Remains inpatient appropriate because:Inpatient level of care appropriate due to severity of illness   Dispo:  Patient From: Home  Planned Disposition: Home with Health Care Svc  Medically stable for discharge: No          Subjective:  No significant overnight events reported. Patient denies chest pain, abdominal pain or issues with breathing. She resumes back to sleep as soon she answers my questions.   Objective: Temp:  [98.2 F (36.8 C)-98.9 F (37.2 C)] 98.7 F (37.1 C) (04/09 0000) Pulse Rate:  [61-89] 66 (04/09 0000) Resp:  [14-20] 20 (04/09 0000) BP: (90-108)/(57-76) 90/59 (04/09 0000) SpO2:  [90 %-100 %] 90 % (04/09 0000) Physical Exam: General: Patient sleeping comfortably in bed, in no  acute  distress. Cardiovascular: RRR, no murmurs auscultated  Respiratory: CTAB, breathing comfortably on 4L O2 Abdomen: soft, nontender, BS+ Extremities: radial and distal pulses strong and equal bilaterally Neuro: alert and easily arousable to voice Psych: mood appropriate   Laboratory: Recent Labs  Lab 09/17/20 0208 09/18/20 0450 09/19/20 0404  WBC 9.6 10.7* 9.7  HGB 12.1 12.5 12.7  HCT 38.1 38.6 38.6  PLT 236 271 271   Recent Labs  Lab 09/17/20 0208 09/18/20 0450 09/19/20 0404  NA 136 128* 130*  K 4.0 4.0 4.1  CL 93* 90* 88*  CO2 38* 34* 34*  BUN 5* 5* 7*  CREATININE 0.44 0.47 0.63  CALCIUM 8.9 8.9 9.1  PROT 5.7*  --   --   BILITOT 0.4  --   --   ALKPHOS 74  --   --   ALT 20  --   --   AST 19  --   --   GLUCOSE 110* 103* 98      Imaging/Diagnostic Tests: No results found.  Donney Dice, DO 09/20/2020, 12:06 AM PGY-1, [place] Intern pager: [phone], text pages welcome

## 2020-09-20 NOTE — Plan of Care (Signed)
  Problem: Education: Goal: Knowledge of disease or condition will improve Outcome: Progressing Goal: Understanding of discharge needs will improve Outcome: Progressing   Problem: Health Behavior/Discharge Planning: Goal: Ability to identify changes in lifestyle to reduce recurrence of condition will improve Outcome: Progressing Goal: Identification of resources available to assist in meeting health care needs will improve Outcome: Progressing   Problem: Physical Regulation: Goal: Complications related to the disease process, condition or treatment will be avoided or minimized Outcome: Progressing   Problem: Safety: Goal: Ability to remain free from injury will improve Outcome: Progressing   Problem: Education: Goal: Knowledge of General Education information will improve Description: Including pain rating scale, medication(s)/side effects and non-pharmacologic comfort measures Outcome: Progressing   Problem: Health Behavior/Discharge Planning: Goal: Ability to manage health-related needs will improve Outcome: Progressing   Problem: Clinical Measurements: Goal: Ability to maintain clinical measurements within normal limits will improve Outcome: Progressing Goal: Will remain free from infection Outcome: Progressing Goal: Diagnostic test results will improve Outcome: Progressing Goal: Respiratory complications will improve Outcome: Progressing Goal: Cardiovascular complication will be avoided Outcome: Progressing   Problem: Activity: Goal: Risk for activity intolerance will decrease Outcome: Progressing   Problem: Nutrition: Goal: Adequate nutrition will be maintained Outcome: Progressing   Problem: Coping: Goal: Level of anxiety will decrease Outcome: Progressing   Problem: Elimination: Goal: Will not experience complications related to bowel motility Outcome: Progressing Goal: Will not experience complications related to urinary retention Outcome:  Progressing   Problem: Pain Managment: Goal: General experience of comfort will improve Outcome: Progressing   Problem: Safety: Goal: Ability to remain free from injury will improve Outcome: Progressing   Problem: Skin Integrity: Goal: Risk for impaired skin integrity will decrease Outcome: Progressing

## 2020-09-21 DIAGNOSIS — R0689 Other abnormalities of breathing: Secondary | ICD-10-CM

## 2020-09-21 DIAGNOSIS — E8770 Fluid overload, unspecified: Secondary | ICD-10-CM | POA: Diagnosis not present

## 2020-09-21 DIAGNOSIS — E8771 Transfusion associated circulatory overload: Secondary | ICD-10-CM

## 2020-09-21 LAB — CBC
HCT: 38.6 % (ref 36.0–46.0)
Hemoglobin: 12.8 g/dL (ref 12.0–15.0)
MCH: 33.1 pg (ref 26.0–34.0)
MCHC: 33.2 g/dL (ref 30.0–36.0)
MCV: 99.7 fL (ref 80.0–100.0)
Platelets: 266 10*3/uL (ref 150–400)
RBC: 3.87 MIL/uL (ref 3.87–5.11)
RDW: 12.4 % (ref 11.5–15.5)
WBC: 10.7 10*3/uL — ABNORMAL HIGH (ref 4.0–10.5)
nRBC: 0 % (ref 0.0–0.2)

## 2020-09-21 LAB — BASIC METABOLIC PANEL
Anion gap: 6 (ref 5–15)
BUN: 6 mg/dL — ABNORMAL LOW (ref 8–23)
CO2: 38 mmol/L — ABNORMAL HIGH (ref 22–32)
Calcium: 9.3 mg/dL (ref 8.9–10.3)
Chloride: 87 mmol/L — ABNORMAL LOW (ref 98–111)
Creatinine, Ser: 0.58 mg/dL (ref 0.44–1.00)
GFR, Estimated: >60 mL/min (ref >60)
Glucose, Bld: 116 mg/dL — ABNORMAL HIGH (ref 70–99)
Potassium: 3.7 mmol/L (ref 3.5–5.1)
Sodium: 131 mmol/L — ABNORMAL LOW (ref 135–145)

## 2020-09-21 LAB — MAGNESIUM: Magnesium: 1.8 mg/dL (ref 1.7–2.4)

## 2020-09-21 MED ORDER — FUROSEMIDE 10 MG/ML IJ SOLN
40.0000 mg | Freq: Once | INTRAMUSCULAR | Status: AC
  Administered 2020-09-21: 40 mg via INTRAVENOUS
  Filled 2020-09-21: qty 4

## 2020-09-21 NOTE — Progress Notes (Signed)
Family Medicine Teaching Service Daily Progress Note Intern Pager: [phone]  Patient name: Karen Casey Medical record number: [id number] Date of birth: 1955/12/12 Age: 65 y.o. Gender: female  Primary Care Provider: [name], MD Consultants: CCM, cardiology Code Status: Full  Pt Overview and Major Events to Date:  3/29 Seizure, intubated, admitted to ICU 3/30 Precedexstarted 4/01 Extubated 4/03 Off Precedex (last 4/2 @2100 ) 4/04 Transferred to care of [facility] 4/05 Transferred to care of [place] Team 4/06 Transferred from ICU to progressive floor  Assessment and Plan: Karen Casey a 65 year old female who presents to the ED with AMS, agitation was found to be in alcohol and benzo withdrawal. Had a seizure, was intubated, admitted to the ICU. CCM transitioned to family medicine team 4/04. PMH includes depression, anxiety, chronic Xanax use (history of seizure from withdrawal in 2013), EtOH abuse (drinks 6-10 beers a day), tobacco abuse (43 pack years).  Acute hypoxic respiratory failure:Improving  COPD  Pulmonary Edema Patient with COPD and pulmonary edema seen on CXR due to IVF during hospitalization. Lasix 40 mg IV given yesterday, UOP appropriate with 3.150L. Lung exam today CTAB. Currently on 4L O2 with goal of >94%, decreased to 3L remained with SpO2 >95%. Na stable at 131, cr stable. Will give another dose of lasix 40 mg IV. Ambulate with pulse ox. Anticipate d/c if stable 4/11. -Umeclidinium daily w/ albuterol rescue prn -Lasix 40 mg IV -continue to wean supplemental O2 as tolerated   ETOH/benzo withdrawal Out of withdrawal window. CIWA protocol discontinued. Per neuro note from time in ICU, plan for Xanax 2mg  BID while inpatient with extended taper schedule with PCP as outpatient. As Xanax has short half life, prefer reducing dose first and keep timing. -continue thiamine -continue folate -continue xanax 2 mg  bid  Dysphagiaheartburn Per recent SLP evaluation, diet changed from dysphagia 2 to a regular diet. -Continue aspiration precautions -GI cocktail daily as needed -Protonix 40 mg bid   Hyponatremia- Improving. Na at 131. Previously on Cymbalta without issue or previouslydocumented hyponatremia. Euglycemic. -monitor clinically as well as for mental status changes -monitor BMP  Hypokalemia- resolved K 3.7. As patient is receiving lasix, will give some supplemental potassium. -monitor BMP  Hypomagnesemia- resolved MG at 1.8 today. Magnesium goal given new onset atrial fibrillation is >2. -Monitor Mg  Chronic anxiety Home meds include Celexa, gabapentin, Abilify, Seroquel, Xanax and Cymbalta. Per neurology, providing Xanax 2 mg twice daily with long-term goal to taper down. -Continue home meds: Abilify, Celexa, Cymbalta, gabapentin, Seroquel -monitor for mental status changes, limit sedating medications   Long-term tobacco use -Continue nicotine patch  FEN/GI: regular diet, PTX PPx: lovenox  Status is: Inpatient  Remains inpatient appropriate because:IV treatments appropriate due to intensity of illness or inability to take PO and Inpatient level of care appropriate due to severity of illness  Dispo:  Patient From: Home  Planned Disposition: Home with Health Care Svc  Medically stable for discharge: No     Subjective:  Patient feeling overall pretty well today. She did not come in oxygen, presently requiring 3L.  Objective: Temp:  [97.9 F (36.6 C)-99.1 F (37.3 C)] 98.9 F (37.2 C) (04/09 2330) Pulse Rate:  [67-87] 67 (04/09 2330) Resp:  [16-20] 16 (04/09 2330) BP: (103-137)/(67-80) 103/67 (04/09 2330) SpO2:  [91 %-98 %] 96 % (04/09 2330) Weight:  [91.1 kg] 91.1 kg (04/09 0415) Physical Exam: General: elderly WW, resting comfortably in bed, NAD, obese Cardiovascular: RRR, no m/r/g, 2+ radial  pulses Respiratory: CTAB, no increased WOB, on 3L  Karen Casey Extremities: warm, dry, no peripheral edema  Laboratory: Recent Labs  Lab 09/18/20 0450 09/19/20 0404 09/20/20 0327  WBC 10.7* 9.7 9.8  HGB 12.5 12.7 12.6  HCT 38.6 38.6 38.2  PLT 271 271 251   Recent Labs  Lab 09/17/20 0208 09/18/20 0450 09/19/20 0404 09/20/20 0327  NA 136 128* 130* 132*  K 4.0 4.0 4.1 4.1  CL 93* 90* 88* 89*  CO2 38* 34* 34* 35*  BUN 5* 5* 7* 8  CREATININE 0.44 0.47 0.63 0.60  CALCIUM 8.9 8.9 9.1 9.1  PROT 5.7*  --   --   --   BILITOT 0.4  --   --   --   ALKPHOS 74  --   --   --   ALT 20  --   --   --   AST 19  --   --   --   GLUCOSE 110* 103* 98 106*   Imaging/Diagnostic Tests: DG CHEST PORT 1 VIEW  Result Date: 09/20/2020 CLINICAL DATA:  Wheezing. EXAM: PORTABLE CHEST 1 VIEW COMPARISON:  September 16, 2020 FINDINGS: The cardiac silhouette is mildly enlarged. Right PICC line in stable position. No evidence of pneumothorax. Improved aeration of the lungs compared to the prior study with residual linear and nodular airspace opacities. Decreased left pleural effusion. Osseous structures are without acute abnormality. Soft tissues are grossly normal. IMPRESSION: 1. Improved aeration of the lungs compared to the prior study with residual linear and nodular airspace opacities. 2. Decreased left pleural effusion. Electronically Signed   By: Fidela Salisbury M.D.   On: 09/20/2020 15:42   Karen Damme, MD 09/21/2020, 1:39 AM PGY-2, [place] Intern pager: [phone], text pages welcome

## 2020-09-22 ENCOUNTER — Other Ambulatory Visit (HOSPITAL_COMMUNITY): Payer: Self-pay

## 2020-09-22 DIAGNOSIS — R4182 Altered mental status, unspecified: Secondary | ICD-10-CM

## 2020-09-22 LAB — CBC
HCT: 38.8 % (ref 36.0–46.0)
Hemoglobin: 13.1 g/dL (ref 12.0–15.0)
MCH: 32.9 pg (ref 26.0–34.0)
MCHC: 33.8 g/dL (ref 30.0–36.0)
MCV: 97.5 fL (ref 80.0–100.0)
Platelets: 302 10*3/uL (ref 150–400)
RBC: 3.98 MIL/uL (ref 3.87–5.11)
RDW: 12.1 % (ref 11.5–15.5)
WBC: 11.7 10*3/uL — ABNORMAL HIGH (ref 4.0–10.5)
nRBC: 0 % (ref 0.0–0.2)

## 2020-09-22 MED ORDER — OMEPRAZOLE 20 MG PO CPDR
20.0000 mg | DELAYED_RELEASE_CAPSULE | Freq: Every day | ORAL | 0 refills | Status: DC
Start: 2020-09-22 — End: 2020-10-17
  Filled 2020-09-22: qty 30, 30d supply, fill #0

## 2020-09-22 MED ORDER — ESOMEPRAZOLE MAGNESIUM 20 MG PO CPDR
20.0000 mg | DELAYED_RELEASE_CAPSULE | Freq: Every day | ORAL | 0 refills | Status: DC
  Filled 2020-09-22: qty 30, 30d supply, fill #0

## 2020-09-22 MED ORDER — POTASSIUM CHLORIDE CRYS ER 10 MEQ PO TBCR
40.0000 meq | EXTENDED_RELEASE_TABLET | Freq: Every day | ORAL | 0 refills | Status: DC
  Filled 2020-09-22: qty 120, 30d supply, fill #0

## 2020-09-22 MED ORDER — FUROSEMIDE 20 MG PO TABS
20.0000 mg | ORAL_TABLET | Freq: Every day | ORAL | 0 refills | Status: DC
  Filled 2020-09-22: qty 30, 30d supply, fill #0

## 2020-09-22 MED ORDER — TIZANIDINE HCL 4 MG PO TABS
4.0000 mg | ORAL_TABLET | Freq: Three times a day (TID) | ORAL | 0 refills | Status: DC | PRN
  Filled 2020-09-22: qty 30, 10d supply, fill #0

## 2020-09-22 NOTE — Evaluation (Signed)
Occupational Therapy Evaluation Patient Details Name: Karen Casey MRN: [id number] DOB: October 11, 1955 Today's Date: 09/22/2020    History of Present Illness 65 yo admitted 3/29 after found with AMS and covered in feces at home. Pt was taken for head CT during which she had a Sz and required intubation 3/29. Extubated 4/1. PMhx: ETOH abuse, depression/anxiety, GERD, IBS, HTN, urinary incontinence   Clinical Impression   PTA, pt lives with spouse and reports typically ambulatory without AD, able to complete ADLs. Pt presents with mild deficits in endurance, strength, standing balance, and cognition. Pt overall Min A for bed mobility, min guard for mobility in room using RW and ADLs standing at sink. Pt Setup for UB ADLs, min guard for LB ADLs. Educated pt on energy conservation strategies with focus on pursed lip breathing during tasks and use of pulse (educated pt where to obtain pulse ox for home use). SpO2 WFL on 2 L O2. Recommend HHOT follow-up, assist for tub transfers, and use of BSC as shower chair. Anticipate quick return to PLOF.     Follow Up Recommendations  Home health OT;Supervision - Intermittent    Equipment Recommendations  3 in 1 bedside commode;Other (comment) (Rolling walker)    Recommendations for Other Services       Precautions / Restrictions Precautions Precautions: Fall Restrictions Weight Bearing Restrictions: No      Mobility Bed Mobility Overal bed mobility: Needs Assistance Bed Mobility: Supine to Sit;Sit to Supine     Supine to sit: Min assist;HOB elevated Sit to supine: Supervision   General bed mobility comments: light Min A via handheld assist to sit up    Transfers Overall transfer level: Needs assistance Equipment used: Rolling walker (2 wheeled) Transfers: Sit to/from Stand Sit to Stand: Min guard         General transfer comment: min guard for power up from bedside, no physical assist needed. cues for hand placement and RW use     Balance Overall balance assessment: History of Falls;Needs assistance Sitting-balance support: No upper extremity supported;Feet supported Sitting balance-Leahy Scale: Fair     Standing balance support: Bilateral upper extremity supported Standing balance-Leahy Scale: Fair Standing balance comment: fair static standing at sink for ADLs, benefits from B UE support for dynamic tasks                           ADL either performed or assessed with clinical judgement   ADL Overall ADL's : Needs assistance/impaired Eating/Feeding: Independent;Sitting   Grooming: Supervision/safety;Standing;Oral care;Wash/dry face Grooming Details (indicate cue type and reason): Supervision for safety, no LOB Upper Body Bathing: Set up;Sitting   Lower Body Bathing: Min guard;Sit to/from stand   Upper Body Dressing : Set up;Sitting   Lower Body Dressing: Min guard;Bed level;Sit to/from stand Lower Body Dressing Details (indicate cue type and reason): able to demo ability to bring feet to self bed level Toilet Transfer: Min guard;Ambulation;RW   Toileting- Clothing Manipulation and Hygiene: Min guard;Sitting/lateral lean;Sit to/from stand       Functional mobility during ADLs: Min guard;Rolling walker;Cueing for sequencing General ADL Comments: Minor deficits in cognition, endurance, and standing balance. Pt benefits from continued education of DME use, WFL O2 levels due to unfamiliarity     Vision Baseline Vision/History: Wears glasses Wears Glasses: Reading only Patient Visual Report: No change from baseline Vision Assessment?: No apparent visual deficits     Perception     Praxis  Pertinent Vitals/Pain Pain Assessment: Faces Faces Pain Scale: Hurts a little bit Pain Location: low back Pain Descriptors / Indicators: Sore Pain Intervention(s): Monitored during session;Repositioned     Hand Dominance Right   Extremity/Trunk Assessment Upper Extremity Assessment Upper  Extremity Assessment: Overall WFL for tasks assessed   Lower Extremity Assessment Lower Extremity Assessment: Defer to PT evaluation   Cervical / Trunk Assessment Cervical / Trunk Assessment: Kyphotic   Communication Communication Communication: No difficulties   Cognition Arousal/Alertness: Awake/alert Behavior During Therapy: WFL for tasks assessed/performed Overall Cognitive Status: Impaired/Different from baseline Area of Impairment: Memory;Safety/judgement;Problem solving;Awareness                     Memory: Decreased short-term memory   Safety/Judgement: Decreased awareness of safety;Decreased awareness of deficits Awareness: Emergent Problem Solving: Requires verbal cues;Requires tactile cues;Slow processing General Comments: slower processing and problem solving. some decreased awareness of deficits, medical literacy in regards to [facility] O2 numbers   General Comments  SpO2 WFL on 2 L O2. Educated on use of pulse ox, DME recs and graudal return to normal activities at home    Exercises     Shoulder [place] expects to be discharged to:: Private residence Living Arrangements: Spouse/significant other Available Help at Discharge: Family;Available 24 hours/day Type of Home: Apartment Home Access: Level entry     Home Layout: One level     Bathroom Shower/Tub: Teacher, early years/pre: Standard     Home Equipment: None          Prior Functioning/Environment Level of Independence: Independent                 OT Problem List: Decreased strength;Decreased activity tolerance;Impaired balance (sitting and/or standing);Decreased cognition;Decreased knowledge of use of DME or AE;Cardiopulmonary status limiting activity      OT Treatment/Interventions: Self-care/ADL training;Therapeutic exercise;Energy conservation;DME and/or AE instruction;Therapeutic activities;Patient/family education;Balance training     OT Goals(Current goals can be found in the care plan section) Acute Rehab OT Goals Patient Stated Goal: go home today OT Goal Formulation: With patient Time For Goal Achievement: 10/06/20 Potential to Achieve Goals: Good ADL Goals Pt Will Perform Lower Body Dressing: with modified independence;sitting/lateral leans;sit to/from stand Pt Will Transfer to Toilet: with modified independence;ambulating Pt Will Perform Tub/Shower Transfer: Tub transfer;with modified independence;ambulating;rolling walker;3 in 1 Pt/caregiver will Perform Home Exercise Program: Increased strength;Both right and left upper extremity;With theraband;Independently;With written HEP provided Additional ADL Goal #1: Pt to verbalize at least 2 energy conservation strategies to implement during ADLs/mobility  OT Frequency: Min 2X/week   Barriers to D/C:            Co-evaluation              AM-PAC OT "6 Clicks" Daily Activity     Outcome Measure Help from another person eating meals?: None Help from another person taking care of personal grooming?: A Little Help from another person toileting, which includes using toliet, bedpan, or urinal?: A Little Help from another person bathing (including washing, rinsing, drying)?: A Little Help from another person to put on and taking off regular upper body clothing?: A Little Help from another person to put on and taking off regular lower body clothing?: A Little 6 Click Score: 19   End of Session Equipment Utilized During Treatment: Rolling walker;Oxygen Nurse Communication: Mobility status  Activity Tolerance: Patient tolerated treatment well Patient left: in bed;with call bell/phone within reach;with bed  alarm set  OT Visit Diagnosis: Unsteadiness on feet (R26.81);Other abnormalities of gait and mobility (R26.89);Muscle weakness (generalized) (M62.81);Other symptoms and signs involving cognitive function                Time: 9872-1587 OT Time Calculation (min):  24 min Charges:  OT General Charges $OT Visit: 1 Visit OT Evaluation $OT Eval Moderate Complexity: 1 Mod OT Treatments $Self Care/Home Management : 8-22 mins  Malachy Chamber, OTR/L Acute Rehab Services Office: [phone]  Layla Maw 09/22/2020, 10:24 AM

## 2020-09-22 NOTE — Progress Notes (Signed)
AVS reviewed with patient and sister at bedside. Prescriptions, walker, home oxygen and 3 in 1 commode sent home with patient. All questions asked and answered. PICC line removed by IV nurse. Patient DC with sister via personal transportation.

## 2020-09-22 NOTE — Progress Notes (Signed)
  Speech Language Pathology Treatment: Dysphagia  Patient Details Name: Karen Casey MRN: [id number] DOB: 04-29-1956 Today's Date: 09/22/2020 Time: 1165-7903 SLP Time Calculation (min) (ACUTE ONLY): 13.38 min  Assessment / Plan / Recommendation Clinical Impression  Pt was seen for dysphagia treatment and was cooperative throughout the session. Pt reported that the meals have been a bit better since the diet has been upgraded and she denied any signs of aspiration with p.o. intake. Pt was seen during lunch with meal tray. She consumed a baked potato, grilled chicken, and thin liquids via straw. Pt tolerated all solids and liquids via straw using individual and consecutive swallows without symptoms of oropharyngeal dysphagia. It is recommended that the current diet be continued. Further skilled SLP services are not clinically indicated at this time.    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 On admission pt's family reported periods of cyclical vomiting that last up to a week. Pt had witnessed seizure in CT (non-focal shaking movements, 3-5 minutes long per CT staff). Pt received a total of 33m Ativan and, upon return to ED was minimally responsive (likely post-ictal) and not protecting her airway. ETT 3/29-4/1 ([id number]. CXR 3/31: Progressive left basilar consolidation. Progressive pulmonary hypoinflation; BSE completed on 09/12/20 with PO readiness goals initiated.      SLP Plan  All goals met;Discharge SLP treatment due to (comment)       Recommendations  Diet recommendations: Regular;Thin liquid Liquids provided via: Cup;Straw Medication Administration: Whole meds with puree Supervision: Patient able to self feed;Staff to assist with self feeding;Full  supervision/cueing for compensatory strategies Compensations: Slow rate;Small sips/bites (rest breaks if dyspneic) Postural Changes and/or Swallow Maneuvers: Seated upright 90 degrees                Oral Care Recommendations: Oral care BID Follow up Recommendations: None SLP Visit Diagnosis: Dysphagia, oropharyngeal phase (R13.12) Plan: All goals met;Discharge SLP treatment due to (comment)       Ana Woodroof I. PHardin Negus MUniversity CEdgewoodOffice number [phone] 3[place]4/04/2021, 12:13 PM

## 2020-09-22 NOTE — TOC Transition Note (Signed)
Transition of Care [facility]) - CM/SW Discharge Note   Patient Details  Name: Karen Casey MRN: [id number] Date of Birth: 1955-06-18  Transition of Care [facility]) CM/SW Contact:  Sharin Mons, RN Phone Number: 09/22/2020, 1:38 PM   Clinical Narrative:    Patient will DC to: home Anticipated DC date: 09/22/2020 Family notified: yes, sister Transport by: car   Per MD patient ready for DC today . RN, patient, patient's family, and Bro[place] notified of DC.  DME needs noted ROLLING WALKER, 3 IN 1/ BSC  And OXYGEN will be delivered to bedside prior to d/c. Referral for DME made with [facility]. Pt without Rx med concerns. Post hospital f/u scheduled and noted on AVS. Charline Bills (Sister)     [phone]     Sister Hilda Blades to provide transportation to home.  RNCM will sign off for now as intervention is no longer needed. Please consult [place] again if new needs arise.   Final next level of care: [place] Barriers to Discharge: No Barriers Identified   Patient Goals and CMS Choice Patient states their goals for this hospitalization and ongoing recovery are:: return home CMS Medicare.gov Compare Post Acute Care list provided to:: Patient Choice offered to / list presented to : Patient  Discharge Placement                       Discharge Plan and Services   Discharge Planning Services: CM Consult Post Acute Care Choice: Home Health          DME Arranged: 3-N-1,Walker rolling,Oxygen DME Agency: [employer] Date DME Agency Contacted: 09/22/20 Time DME Agency Contacted: [phone] Representative spoke with at DME Agency: Freda Munro [place]: PT,Social Work [place]: [facility] [employer] (now known as [employer]) Date [place]: 09/16/20 Time [place]: 1659 Representative spoke with at [place]: [place]  Social Determinants of Health ([place]) Interventions     Readmission Risk Interventions No  flowsheet data found.

## 2020-09-22 NOTE — Progress Notes (Signed)
Family Medicine Teaching Service Daily Progress Note Intern Pager: [phone]  Patient name: Karen Casey Medical record number: [id number] Date of birth: 08/29/1955 Age: 65 y.o. Gender: female  Primary Care Provider: [name], MD Consultants: CCM, cardiology  Code Status: Full   Pt Overview and Major Events to Date:  3/29 Seizure, intubated, admitted to ICU 3/30 Precedexstarted 4/01 Extubated 4/03 Off Precedex (last 4/2 @2100 ) 4/04 Transferred to care of [facility] 4/05 Transferred to care of [place] Team 4/06 Transferred from ICU to progressive floor  Assessment and Plan: Karen Casey a 65 year old female who presents to the ED with AMS, agitation was found to be in alcohol and benzo withdrawal. Had a seizure, was intubated, admitted to the ICU. CCM transitioned to family medicine team 4/04. PMH includes depression, anxiety, chronic Xanax use (history of seizure from withdrawal in 2013), EtOH abuse (drinks 6-10 beers a day), tobacco abuse (43 pack years).  Acute hypoxic respiratory failure:Improving  COPD  Pulmonary Edema Patient with COPD and pulmonary edema seen on CXR due to IVF during hospitalization. Patient was initially weaned to room air and then upon ambulating desatted from 93% to 88%, nurse placed her on 2L O2. -Umeclidinium daily w/ albuterol rescue prn -wean O2 as tolerated, consider sending home on home oxygen   ETOH/benzo withdrawal Out of withdrawal window. CIWA protocol discontinued. Per neuro note from time in ICU, plan for Xanax 2mg  BID while inpatient with extended taper schedule with PCP as outpatient. As Xanax has short half life, prefer reducing dose first and keep timing. -continue thiamine -continue folate -continue xanax 2 mg bid  Dysphagiaheartburn Per recent SLP evaluation, diet changed from dysphagia 2 to a regular diet. -GI cocktail daily as needed -Protonix40 mg  bid  Hyponatremia Improving. Na at 131 most recently.  Previouslyon Cymbalta without issue or previouslydocumented hyponatremia. Euglycemic. -monitor clinically as well as for mental status changes -pending BMP  Hypokalemia Likely resolved. Most recently K 3.7.  -pending BMP  Hypomagnesemia- resolved Likely resolved. Mg 1.8 most recently. Magnesium goal >2 given new onset atrial fibrillation. -pending Mg  Chronic anxiety Home meds include Celexa, gabapentin, Abilify, Seroquel, XanaxandCymbalta. Per neurology, providing Xanax 2 mg twice daily with long-term goal to taper down. -Continuehome meds:Abilify, Celexa, Cymbalta, gabapentin, Seroquel -monitor for mental status changes, limit sedating medications  Long-term tobacco use -Continue nicotine patch  FEN/GI: regular diet PPx: lovenox    Status is: Inpatient    Dispo:  Patient From: Home  Planned Disposition: Home with Health Care Svc  Medically stable for discharge: Yes          Subjective:  No acute overnight events reported. Denies chest pain and dyspnea. She endorses feeling good when the nurse but just feels weak. Otherwise, she is very amendable for discharging home today and hopeful to do so.  Objective: Temp:  [98 F (36.7 C)-98.6 F (37 C)] 98.6 F (37 C) (04/11 0719) Pulse Rate:  [69-87] 87 (04/11 0719) Resp:  [17-21] 21 (04/11 0719) BP: (96-126)/(54-73) 115/65 (04/11 0719) SpO2:  [92 %-97 %] 92 % (04/11 0719) Weight:  [90.6 kg] 90.6 kg (04/11 0500) Physical Exam: General: Patient sitting upright in bed comfortably, in no acute distress. Cardiovascular: RRR, no murmurs or gallops auscultated Respiratory: CTAB, breathing comfortably on 2L O2 Abdomen: soft, nontender, BS+ Extremities: no LE edema noted bilaterally, radial and distal pulses strong and equal bilaterally Neuro: AOx4 Psych: mood appropriate   Laboratory: Recent Labs  Lab 09/20/20 0327 09/21/20  3716 09/22/20 0351   WBC 9.8 10.7* 11.7*  HGB 12.6 12.8 13.1  HCT 38.2 38.6 38.8  PLT 251 266 302   Recent Labs  Lab 09/17/20 0208 09/18/20 0450 09/19/20 0404 09/20/20 0327 09/21/20 0306  NA 136   < > 130* 132* 131*  K 4.0   < > 4.1 4.1 3.7  CL 93*   < > 88* 89* 87*  CO2 38*   < > 34* 35* 38*  BUN 5*   < > 7* 8 6*  CREATININE 0.44   < > 0.63 0.60 0.58  CALCIUM 8.9   < > 9.1 9.1 9.3  PROT 5.7*  --   --   --   --   BILITOT 0.4  --   --   --   --   ALKPHOS 74  --   --   --   --   ALT 20  --   --   --   --   AST 19  --   --   --   --   GLUCOSE 110*   < > 98 106* 116*   < > = values in this interval not displayed.      Imaging/Diagnostic Tests: No results found.  Donney Dice, DO 09/22/2020, 7:55 AM PGY-1, [place] Intern pager: [phone], text pages welcome

## 2020-09-22 NOTE — Progress Notes (Signed)
SATURATION QUALIFICATIONS: (This note is used to comply with regulatory documentation for home oxygen)  Patient Saturations on Room Air at Rest = 94%  Patient Saturations on Room Air while Ambulating = 88%  Patient Saturations on 2 Liters of oxygen while Ambulating = 92%  Please briefly explain why patient needs home oxygen: dyspnea and desat with ambulation

## 2020-09-22 NOTE — Discharge Summary (Signed)
[facility] Discharge Summary  Patient name: Karen Casey Medical record number: [id number] Date of birth: 1956/03/24 Age: 65 y.o. Gender: female Date of Admission: 09/09/2020  Date of Discharge: 09/22/2020 Admitting Physician: Deland Pretty, MD  Primary Care Provider: [name], MD Consultants: Critical care, cardiology   Indication for Hospitalization: altered mental status  Discharge Diagnoses/Problem List:  Altered mental status EtOH and benzo withdrawal Acute hypoxic respiratory failure Dysphagia  Hyponatremia Hypokalemia Hypomagnesemia  Anxiety Tobacco use   Disposition: home  Discharge Condition: medically stable   Discharge Exam:  Temp:  [98 F (36.7 C)-98.6 F (37 C)] 98.6 F (37 C) (04/11 0719) Pulse Rate:  [69-87] 87 (04/11 0719) Resp:  [17-21] 21 (04/11 0719) BP: (96-126)/(54-73) 115/65 (04/11 0719) SpO2:  [92 %-97 %] 92 % (04/11 0719) Weight:  [90.6 kg] 90.6 kg (04/11 0500) Physical Exam: General: Patient sitting upright in bed comfortably, in no acute distress. Cardiovascular: RRR, no murmurs or gallops auscultated Respiratory: CTAB, breathing comfortably on 2L O2 Abdomen: soft, nontender, BS+ Extremities: no LE edema noted bilaterally, radial and distal pulses strong and equal bilaterally Neuro: AOx4 Psych: mood appropriate   Brief Hospital Course:  [name] a 65 year old female who presents to the ED with AMS, agitation was found to be in alcohol and benzo withdrawal. 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  Altered mental status  Seizure  Patient presented to the ED altered, pertinent past medical history includes EtOH abuse and benzo use (Xanax prescribed by psychiatrist). Patient noted to have witnessed seizure while getting CT head that lasted about 3-5 minutes with presence of post-ictal state that later  requiring intubation and consequent ICU admission. CT head unremarkable. Neurology consulted, EEG performed demonstrated no epileptiform discharge or seizures noted during recording although this was noted to be an abnormal study given excess beta activity secondary to sedation. This acute encephalopathy and altered mental status is likely secondary to alcohol and substance abuse. Neurology's recommendations include continuing xanax 2 mg bid with continued taper outpatient. Patient extubated 3 days following intubation and transferred from the ICU to floor status 2 days later. Patient returned back to baseline mental status well before discharge and did not have any further seizures throughout the remainder of her hospital stay. Seizure precautions given.   Acute hypoxic respiratory failure Thought to be secondary to pulmonary edema given development of productive cough and dyspnea that required patient to utilize a new oxygen requirement of 2L O2, especially overnight. Given lasix.Patient was weaned off oxygen and had no issues on room air until ambulating with pulse ox caused her to desaturate to 88%. Placed on 2L O2 again and discharged on this new oxygen requirement to be used as needed. Patient also discharged on lasix 20 mg along with potassium supplementation.    Electrolyte abnormalities  Patient noted to have hypomagnesemia, hyponatremia and hypokalemia on multiple occasional throughout hospitalization, thought to be in the setting of dehydration. Appropriate repletion given along with IVF. Cymbalta was discontinued for a short amount of time during hospitalization. Electrolyte abnormalities resolved prior to discharge, home med cymbalta restarted on discharge. Given atrial fibrillation, electrolyte goals include potassium >4 and magnesium >2.   New onset atrial fibrillation Presented with new onset atrial fibrillation while in the ED, possibly multifactorial given state of dehydration along with  multiple electrolyte abnormalities. Cardiology consulted, patient initially placed on diltiazem bolus with continued cardiac monitoring and  metoprolol. Transitioned from diltiazem. Started on eliquis 5 mg bid daily. Cardiology's recommendations included to continue metoprolol and eliquis. Atrial fibrillation noted to resolve well before day of discharge. Instructed to continue eliquis for 4 weeks following discharge with plan to follow up with cardiology outpatient for possible cardioversion.   All other issues chronic and stable.   Issues for Follow Up:  1. Metoprolol dose 100 mg bid and eliquis 5 mg bid, ensure patient is compliant with this and all other medications.  2. Have patient taper Xanax by 25% every 2-4 weeks as outpatient.  3. Recommend consider tapering gabapentin if appropriate given sedating properties.  4. Ensure patient does not drive or operate machinery for 6 months given recent seizure.  5. Ensure continues to follow with psychiatry for appropriate psych med management. 6. Discharged on potassium and Lasix 20mg  daily. Recommend checking BMP at follow up, given persistent electrolyte derangements during hospitalization.  7. Discharged on oxygen which she needed during ambulation 8. Patient restarted on home med cymbalta although she had multiple episodes of hyponatremia during this hospitalization. Recheck BMP levels periodically to ensure no electrolyte abnormalities exist.  9. 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  10. Ensure patient follows up with cardiology outpatient for possible cardioversion if appropriate.  11. Encourage tobacco cessation.   Significant Procedures:  EEG performed notable for no epileptiform discharge or seizures noted during recording although this was noted to be an abnormal study given excess beta activity secondary to sedation.  Significant Labs and Imaging:  Recent  Labs  Lab 09/20/20 0327 09/21/20 0306 09/22/20 0351  WBC 9.8 10.7* 11.7*  HGB 12.6 12.8 13.1  HCT 38.2 38.6 38.8  PLT 251 266 302   Recent Labs  Lab 09/17/20 0208 09/18/20 0450 09/19/20 0404 09/20/20 0327 09/20/20 0700 09/21/20 0306  NA 136 128* 130* 132*  --  131*  K 4.0 4.0 4.1 4.1  --  3.7  CL 93* 90* 88* 89*  --  87*  CO2 38* 34* 34* 35*  --  38*  GLUCOSE 110* 103* 98 106*  --  116*  BUN 5* 5* 7* 8  --  6*  CREATININE 0.44 0.47 0.63 0.60  --  0.58  CALCIUM 8.9 8.9 9.1 9.1  --  9.3  MG 2.0 1.7 1.9  --  2.0 1.8  PHOS 2.9  --   --   --   --   --   ALKPHOS 74  --   --   --   --   --   AST 19  --   --   --   --   --   ALT 20  --   --   --   --   --   ALBUMIN 2.4*  --   --   --   --   --       Results/Tests Pending at Time of Discharge:  none  Discharge Medications:  Allergies as of 09/22/2020   No Known Allergies     Medication List    STOP taking these medications   amLODipine 10 MG tablet Commonly known as: NORVASC   esomeprazole 40 MG capsule Commonly known as: NexIUM     TAKE these medications   ALPRAZolam 1 MG tablet Commonly known as: XANAX Take 2 tablets (2 mg total) by mouth 2 (two) times daily. What changed:   medication strength  when to take this  reasons to take this   ARIPiprazole 5 MG tablet Commonly known as: ABILIFY Take 5 mg by mouth daily.   azelastine 0.05 % ophthalmic solution Commonly known as: OPTIVAR Place 1 drop into both eyes 2 (two) times daily.   CENTRUM SILVER 50+WOMEN PO Take 1 tablet by mouth daily.   citalopram 40 MG tablet Commonly known as: CELEXA Take 1 tablet (40 mg total) by mouth daily.   DULoxetine 60 MG capsule Commonly known as: CYMBALTA Take 60 mg by mouth daily.   Eliquis 5 MG Tabs tablet Generic drug: apixaban Take 1 tablet (5 mg total) by mouth 2 (two) times daily.   fluticasone 50 MCG/ACT nasal spray Commonly known as: FLONASE SHAKE LIQUID AND USE 2 SPRAYS IN EACH NOSTRIL DAILY What  changed:   how much to take  how to take this  when to take this  reasons to take this  additional instructions   furosemide 20 MG tablet Commonly known as: Lasix Take 1 tablet (20 mg total) by mouth daily.   gabapentin 600 MG tablet Commonly known as: NEURONTIN Take 2 tablets (1,200 mg total) by mouth 3 (three) times daily.   metoprolol tartrate 100 MG tablet Commonly known as: LOPRESSOR Take 1 tablet (100 mg total) by mouth 2 (two) times daily.   omeprazole 20 MG capsule Commonly known as: PRILOSEC Take 1 capsule (20 mg total) by mouth daily.   polyvinyl alcohol 1.4 % ophthalmic solution Commonly known as: LiquiTears Place 3 drops into both eyes as needed for dry eyes.   potassium chloride 10 MEQ tablet Commonly known as: KLOR-CON Take 4 tablets (40 mEq total) by mouth daily.   PROBIOTIC PO Take 1 capsule by mouth daily.   QUEtiapine Fumarate 150 MG 24 hr tablet Commonly known as: SEROQUEL XR Take 150 mg by mouth at bedtime.   tiZANidine 4 MG tablet Commonly known as: ZANAFLEX Take 1 tablet (4 mg total) by mouth every 8 (eight) hours as needed for muscle spasms. What changed:   when to take this  reasons to take this            Durable Medical Equipment  (From admission, onward)         Start     Ordered   09/22/20 1347  For home use only DME 3 n 1  Once        09/22/20 1347   09/22/20 1346  For home use only DME Walker rolling  Once       Question Answer Comment  Walker: With Narka Wheels   Patient needs a walker to treat with the following condition Weakness      09/22/20 1347   09/22/20 1345  For home use only DME oxygen  Once       Question Answer Comment  Length of Need 6 Months   Mode or (Route) Nasal cannula   Liters per Minute 2   Frequency Continuous (stationary and portable oxygen unit needed)   Oxygen delivery system Gas      09/22/20 1345   09/22/20 0000  For home use only DME oxygen       Question Answer Comment  Length  of Need 6 Months   Mode or (Route) Nasal cannula   Oxygen delivery system Gas      09/22/20 1128          Discharge Instructions: Please refer to Patient Instructions section of EMR for full details.  Patient was counseled important signs and  symptoms that should prompt return to medical care, changes in medications, dietary instructions, activity restrictions, and follow up appointments.   Follow-Up Appointments:  Follow-up Information    [facility] [place] Follow up.   Specialty: [place] Why: the office to call to schedule  home health visits Contact information: [address] [place] [address] [phone]        Alethia Berthold, PA-C Follow up on 10/17/2020.   Specialty: Cardiology Why: Appointment at 9:30 AM on 10/17/20. Please arrive 15 minutes early and bring all medications with you.  Contact information: [place] [address] [phone]        [name], MD. Go on 09/25/2020.   Specialty: Family Medicine Why: @3 :15pm. This will be a hospital follow up appointment with your primary care clinic. As your PCP Dr. Thompson Grayer does not have any open appointments, you will see Dr. Nita Sells. They will continue your xanax taper at this appointment.  Contact information: [place] [address] [phone]               Donney Dice, DO 09/22/2020, 9:56 PM PGY-1, [place]

## 2020-09-22 NOTE — Progress Notes (Signed)
Physical Therapy Treatment Patient Details Name: Karen Casey MRN: [id number] DOB: 1955-07-19 Today's Date: 09/22/2020    History of Present Illness Pt is 65 yo admitted 3/29 after found with AMS and covered in feces at home. Pt was taken for head CT during which she had a Sz and required intubation 3/29. Extubated 4/1. PMhx: ETOH abuse, depression/anxiety, GERD, IBS, HTN, urinary incontinence    PT Comments    Pt making good progress today.  She was able to ambulate up to 14' with RW and min guard before needing a break.  Pt does need cues for safety and transfer technique.  VSS on 2 L of O2.  Continue plan of care.     Follow Up Recommendations  Home health PT;Supervision for mobility/OOB     Equipment Recommendations  Rolling walker with 5" wheels;3in1 (PT)    Recommendations for Other Services       Precautions / Restrictions Precautions Precautions: Fall    Mobility  Bed Mobility Overal bed mobility: Needs Assistance Bed Mobility: Supine to Sit     Supine to sit: Min assist;HOB elevated     General bed mobility comments: light handheld min A; increased time    Transfers Overall transfer level: Needs assistance Equipment used: Rolling walker (2 wheeled) Transfers: Sit to/from Stand Sit to Stand: Min guard;Min assist         General transfer comment: Increased time and repeated cues for hand placement; min guard from bed and recliner; min A from lower toilet  Ambulation/Gait Ambulation/Gait assistance: Min guard Gait Distance (Feet): 60 Feet (60', 20', 10', 25') Assistive device: Rolling walker (2 wheeled) Gait Pattern/deviations: Step-to pattern;Decreased stride length     General Gait Details: Ambulated in hall, rest break, started again but needing to go to bathroom, rolled to bathroom and walked in and used bathroom, then walked back to bed.  Requiring cues for RW use and posture; min guard for safety   Stairs Stairs:  (no steps at home)            Wheelchair Mobility    Modified Rankin (Stroke Patients Only)       Balance Overall balance assessment: Needs assistance Sitting-balance support: No upper extremity supported;Feet supported Sitting balance-Leahy Scale: Good     Standing balance support: Bilateral upper extremity supported;No upper extremity supported Standing balance-Leahy Scale: Fair Standing balance comment: UE support for dynamic gait but able to stand at sink and wash hands without support                            Cognition Arousal/Alertness: Awake/alert Behavior During Therapy: WFL for tasks assessed/performed Overall Cognitive Status: Impaired/Different from baseline Area of Impairment: Memory;Problem solving;Awareness                     Memory: Decreased short-term memory   Safety/Judgement: Decreased awareness of safety;Decreased awareness of deficits Awareness: Emergent Problem Solving: Slow processing;Requires verbal cues;Requires tactile cues        Exercises      General Comments General comments (skin integrity, edema, etc.): Pt on 2 L O2 with sats 94% or greater.  RN reports checked earlier today without O2 and sats dropped - did not try without O2.  Pt educated on safety with O2 line and having assist with mobility.  Pt asking what type of O2 and how to turn on and use at home - discussed PT unsure what set up she  would get but that whoever set it up would explain.      Pertinent Vitals/Pain Pain Assessment: No/denies pain    Home Living Family/patient expects to be discharged to:: Private residence Living Arrangements: Spouse/significant other                  Prior Function            PT Goals (current goals can now be found in the care plan section) Acute Rehab PT Goals Patient Stated Goal: go home today PT Goal Formulation: With patient Time For Goal Achievement: 09/27/20 Potential to Achieve Goals: Fair Progress towards PT goals:  Progressing toward goals    Frequency    Min 3X/week      PT Plan Current plan remains appropriate    Co-evaluation              AM-PAC PT "6 Clicks" Mobility   Outcome Measure  Help needed turning from your back to your side while in a flat bed without using bedrails?: A Little Help needed moving from lying on your back to sitting on the side of a flat bed without using bedrails?: A Little Help needed moving to and from a bed to a chair (including a wheelchair)?: A Little Help needed standing up from a chair using your arms (e.g., wheelchair or bedside chair)?: A Little Help needed to walk in hospital room?: A Little Help needed climbing 3-5 steps with a railing? : A Little 6 Click Score: 18    End of Session Equipment Utilized During Treatment: Gait belt;Oxygen Activity Tolerance: Patient tolerated treatment well Patient left: with call bell/phone within reach;in chair;with chair alarm set Nurse Communication: Mobility status PT Visit Diagnosis: Other abnormalities of gait and mobility (R26.89);Difficulty in walking, not elsewhere classified (R26.2)     Time: 1425-1450 PT Time Calculation (min) (ACUTE ONLY): 25 min  Charges:  $Gait Training: 8-22 mins $Therapeutic Activity: 8-22 mins                     Abran Richard, PT Acute Rehab Services Pager [phone] [facility] [place] 09/22/2020, 4:46 PM

## 2020-09-22 NOTE — Discharge Instructions (Signed)
You were hospitalized at [facility] due to a seizure which improved after medications  We are so glad you are feeling better. Be sure to follow-up with your regularly scheduled appointments.  Please also be sure to follow-up with our clinic on 4/14.  Thank you for allowing [place] to be a part of your medical care.  Take care, [facility] family medicine team   Information on my medicine - ELIQUIS (apixaban)  Why was Eliquis prescribed for you? Eliquis was prescribed for you to reduce the risk of a blood clot forming that can cause a stroke if you have a medical condition called atrial fibrillation (a type of irregular heartbeat).  What do You need to know about Eliquis ? Take your Eliquis TWICE DAILY - one tablet in the morning and one tablet in the evening with or without food. If you have difficulty swallowing the tablet whole please discuss with your pharmacist how to take the medication safely.  Take Eliquis exactly as prescribed by your doctor and DO NOT stop taking Eliquis without talking to the doctor who prescribed the medication.  Stopping may increase your risk of developing a stroke.  Refill your prescription before you run out.  After discharge, you should have regular check-up appointments with your healthcare provider that is prescribing your Eliquis.  In the future your dose may need to be changed if your kidney function or weight changes by a significant amount or as you get older.  What do you do if you miss a dose? If you miss a dose, take it as soon as you remember on the same day and resume taking twice daily.  Do not take more than one dose of ELIQUIS at the same time to make up a missed dose.  Important Safety Information A possible side effect of Eliquis is bleeding. You should call your healthcare provider right away if you experience any of the following: ? Bleeding from an injury or your nose that does not stop. ? Unusual colored urine (red or dark brown) or  unusual colored stools (red or black). ? Unusual bruising for unknown reasons. ? A serious fall or if you hit your head (even if there is no bleeding).  Some medicines may interact with Eliquis and might increase your risk of bleeding or clotting while on Eliquis. To help avoid this, consult your healthcare provider or pharmacist prior to using any new prescription or non-prescription medications, including herbals, vitamins, non-steroidal anti-inflammatory drugs (NSAIDs) and supplements.  This website has more information on Eliquis (apixaban): http://www.eliquis.com/eliquis/home

## 2020-09-25 ENCOUNTER — Ambulatory Visit: Payer: 59 | Admitting: Family Medicine

## 2020-09-25 ENCOUNTER — Emergency Department (HOSPITAL_COMMUNITY): Payer: 59

## 2020-09-25 ENCOUNTER — Inpatient Hospital Stay (HOSPITAL_COMMUNITY)
Admission: EM | Admit: 2020-09-25 | Discharge: 2020-09-28 | DRG: 644 | Disposition: A | Discharge status: Home / Self Care | Payer: 59 | Attending: Family Medicine | Admitting: Family Medicine

## 2020-09-25 ENCOUNTER — Other Ambulatory Visit: Payer: Self-pay

## 2020-09-25 DIAGNOSIS — F1010 Alcohol abuse, uncomplicated: Secondary | ICD-10-CM | POA: Diagnosis present

## 2020-09-25 DIAGNOSIS — K219 Gastro-esophageal reflux disease without esophagitis: Secondary | ICD-10-CM | POA: Diagnosis not present

## 2020-09-25 DIAGNOSIS — Z8249 Family history of ischemic heart disease and other diseases of the circulatory system: Secondary | ICD-10-CM

## 2020-09-25 DIAGNOSIS — E222 Syndrome of inappropriate secretion of antidiuretic hormone: Principal | ICD-10-CM | POA: Diagnosis present

## 2020-09-25 DIAGNOSIS — Z20822 Contact with and (suspected) exposure to covid-19: Secondary | ICD-10-CM | POA: Diagnosis present

## 2020-09-25 DIAGNOSIS — T43215A Adverse effect of selective serotonin and norepinephrine reuptake inhibitors, initial encounter: Secondary | ICD-10-CM | POA: Diagnosis present

## 2020-09-25 DIAGNOSIS — G40909 Epilepsy, unspecified, not intractable, without status epilepticus: Secondary | ICD-10-CM | POA: Diagnosis present

## 2020-09-25 DIAGNOSIS — I4819 Other persistent atrial fibrillation: Secondary | ICD-10-CM | POA: Diagnosis present

## 2020-09-25 DIAGNOSIS — T43225A Adverse effect of selective serotonin reuptake inhibitors, initial encounter: Secondary | ICD-10-CM | POA: Diagnosis present

## 2020-09-25 DIAGNOSIS — Z79899 Other long term (current) drug therapy: Secondary | ICD-10-CM

## 2020-09-25 DIAGNOSIS — Z7901 Long term (current) use of anticoagulants: Secondary | ICD-10-CM

## 2020-09-25 DIAGNOSIS — E871 Hypo-osmolality and hyponatremia: Secondary | ICD-10-CM | POA: Diagnosis present

## 2020-09-25 DIAGNOSIS — K5900 Constipation, unspecified: Secondary | ICD-10-CM | POA: Diagnosis present

## 2020-09-25 DIAGNOSIS — F32A Depression, unspecified: Secondary | ICD-10-CM | POA: Diagnosis present

## 2020-09-25 DIAGNOSIS — F17210 Nicotine dependence, cigarettes, uncomplicated: Secondary | ICD-10-CM | POA: Diagnosis present

## 2020-09-25 DIAGNOSIS — R1013 Epigastric pain: Secondary | ICD-10-CM | POA: Diagnosis present

## 2020-09-25 DIAGNOSIS — G8929 Other chronic pain: Secondary | ICD-10-CM | POA: Diagnosis present

## 2020-09-25 DIAGNOSIS — I480 Paroxysmal atrial fibrillation: Secondary | ICD-10-CM

## 2020-09-25 DIAGNOSIS — Z818 Family history of other mental and behavioral disorders: Secondary | ICD-10-CM

## 2020-09-25 DIAGNOSIS — R918 Other nonspecific abnormal finding of lung field: Secondary | ICD-10-CM | POA: Diagnosis present

## 2020-09-25 DIAGNOSIS — J9611 Chronic respiratory failure with hypoxia: Secondary | ICD-10-CM | POA: Diagnosis present

## 2020-09-25 DIAGNOSIS — F419 Anxiety disorder, unspecified: Secondary | ICD-10-CM | POA: Diagnosis present

## 2020-09-25 DIAGNOSIS — R0789 Other chest pain: Secondary | ICD-10-CM

## 2020-09-25 DIAGNOSIS — R300 Dysuria: Secondary | ICD-10-CM | POA: Diagnosis present

## 2020-09-25 DIAGNOSIS — I10 Essential (primary) hypertension: Secondary | ICD-10-CM | POA: Diagnosis present

## 2020-09-25 LAB — BASIC METABOLIC PANEL
Anion gap: 8 (ref 5–15)
Anion gap: 7 (ref 5–15)
BUN: <5 mg/dL — ABNORMAL LOW (ref 8–23)
BUN: <5 mg/dL — ABNORMAL LOW (ref 8–23)
CO2: 26 mmol/L (ref 22–32)
CO2: 29 mmol/L (ref 22–32)
Calcium: 9.1 mg/dL (ref 8.9–10.3)
Calcium: 9.1 mg/dL (ref 8.9–10.3)
Chloride: 85 mmol/L — ABNORMAL LOW (ref 98–111)
Chloride: 83 mmol/L — ABNORMAL LOW (ref 98–111)
Creatinine, Ser: 0.49 mg/dL (ref 0.44–1.00)
Creatinine, Ser: 0.52 mg/dL (ref 0.44–1.00)
GFR, Estimated: >60 mL/min (ref >60)
GFR, Estimated: >60 mL/min (ref >60)
Glucose, Bld: 105 mg/dL — ABNORMAL HIGH (ref 70–99)
Glucose, Bld: 106 mg/dL — ABNORMAL HIGH (ref 70–99)
Potassium: 4 mmol/L (ref 3.5–5.1)
Potassium: 4 mmol/L (ref 3.5–5.1)
Sodium: 119 mmol/L — CL (ref 135–145)
Sodium: 119 mmol/L — CL (ref 135–145)

## 2020-09-25 LAB — COMPREHENSIVE METABOLIC PANEL
ALT: 31 U/L (ref 0–44)
AST: 31 U/L (ref 15–41)
Albumin: 3.3 g/dL — ABNORMAL LOW (ref 3.5–5.0)
Alkaline Phosphatase: 95 U/L (ref 38–126)
Anion gap: 10 (ref 5–15)
BUN: <5 mg/dL — ABNORMAL LOW (ref 8–23)
CO2: 25 mmol/L (ref 22–32)
Calcium: 9 mg/dL (ref 8.9–10.3)
Chloride: 87 mmol/L — ABNORMAL LOW (ref 98–111)
Creatinine, Ser: 0.53 mg/dL (ref 0.44–1.00)
GFR, Estimated: >60 mL/min (ref >60)
Glucose, Bld: 110 mg/dL — ABNORMAL HIGH (ref 70–99)
Potassium: 4.4 mmol/L (ref 3.5–5.1)
Sodium: 122 mmol/L — ABNORMAL LOW (ref 135–145)
Total Bilirubin: 0.4 mg/dL (ref 0.3–1.2)
Total Protein: 6.9 g/dL (ref 6.5–8.1)

## 2020-09-25 LAB — RAPID URINE DRUG SCREEN, HOSP PERFORMED
Amphetamines: NOT DETECTED
Barbiturates: NOT DETECTED
Benzodiazepines: POSITIVE — AB
Cocaine: NOT DETECTED
Opiates: NOT DETECTED
Tetrahydrocannabinol: POSITIVE — AB

## 2020-09-25 LAB — TSH: TSH: 2.088 u[IU]/mL (ref 0.350–4.500)

## 2020-09-25 LAB — CBC WITH DIFFERENTIAL/PLATELET
Abs Immature Granulocytes: 0.03 10*3/uL (ref 0.00–0.07)
Basophils Absolute: 0.1 10*3/uL (ref 0.0–0.1)
Basophils Relative: 1 %
Eosinophils Absolute: 0.3 10*3/uL (ref 0.0–0.5)
Eosinophils Relative: 3 %
HCT: 40.6 % (ref 36.0–46.0)
Hemoglobin: 14.4 g/dL (ref 12.0–15.0)
Immature Granulocytes: 0 %
Lymphocytes Relative: 18 %
Lymphs Abs: 1.8 10*3/uL (ref 0.7–4.0)
MCH: 32.6 pg (ref 26.0–34.0)
MCHC: 35.5 g/dL (ref 30.0–36.0)
MCV: 91.9 fL (ref 80.0–100.0)
Monocytes Absolute: 0.9 10*3/uL (ref 0.1–1.0)
Monocytes Relative: 9 %
Neutro Abs: 7.1 10*3/uL (ref 1.7–7.7)
Neutrophils Relative %: 69 %
Platelets: 519 10*3/uL — ABNORMAL HIGH (ref 150–400)
RBC: 4.42 MIL/uL (ref 3.87–5.11)
RDW: 11.9 % (ref 11.5–15.5)
WBC: 10.2 10*3/uL (ref 4.0–10.5)
nRBC: 0 % (ref 0.0–0.2)

## 2020-09-25 LAB — URINALYSIS, ROUTINE W REFLEX MICROSCOPIC
Bilirubin Urine: NEGATIVE
Glucose, UA: NEGATIVE mg/dL
Hgb urine dipstick: NEGATIVE
Ketones, ur: NEGATIVE mg/dL
Leukocytes,Ua: NEGATIVE
Nitrite: NEGATIVE
Protein, ur: NEGATIVE mg/dL
Specific Gravity, Urine: 1.014 (ref 1.005–1.030)
pH: 7 (ref 5.0–8.0)

## 2020-09-25 LAB — MAGNESIUM: Magnesium: 1.3 mg/dL — ABNORMAL LOW (ref 1.7–2.4)

## 2020-09-25 LAB — NA AND K (SODIUM & POTASSIUM), RAND UR
Potassium Urine: 52 mmol/L
Sodium, Ur: 188 mmol/L

## 2020-09-25 LAB — LACTIC ACID, PLASMA: Lactic Acid, Venous: 0.9 mmol/L (ref 0.5–1.9)

## 2020-09-25 LAB — CREATININE, URINE, RANDOM: Creatinine, Urine: 65.6 mg/dL

## 2020-09-25 LAB — ETHANOL: Alcohol, Ethyl (B): <10 mg/dL (ref <10)

## 2020-09-25 LAB — TROPONIN I (HIGH SENSITIVITY)
Troponin I (High Sensitivity): 6 ng/L (ref <18)
Troponin I (High Sensitivity): 7 ng/L (ref <18)
Troponin I (High Sensitivity): 7 ng/L (ref <18)

## 2020-09-25 LAB — PHOSPHORUS: Phosphorus: 3.3 mg/dL (ref 2.5–4.6)

## 2020-09-25 LAB — PROTIME-INR
INR: 1.3 — ABNORMAL HIGH (ref 0.8–1.2)
Prothrombin Time: 16 seconds — ABNORMAL HIGH (ref 11.4–15.2)

## 2020-09-25 LAB — LIPASE, BLOOD: Lipase: 39 U/L (ref 11–51)

## 2020-09-25 LAB — SARS CORONAVIRUS 2 (TAT 6-24 HRS): SARS Coronavirus 2: NEGATIVE

## 2020-09-25 LAB — OSMOLALITY, URINE: Osmolality, Ur: 564 mOsm/kg (ref 300–900)

## 2020-09-25 LAB — OSMOLALITY: Osmolality: 248 mOsm/kg — CL (ref 275–295)

## 2020-09-25 MED ORDER — POLYVINYL ALCOHOL 1.4 % OP SOLN
3.0000 [drp] | OPHTHALMIC | Status: DC | PRN
  Filled 2020-09-25: qty 15

## 2020-09-25 MED ORDER — METOPROLOL TARTRATE 25 MG PO TABS
100.0000 mg | ORAL_TABLET | Freq: Two times a day (BID) | ORAL | Status: DC
  Administered 2020-09-25 – 2020-09-28 (×6): 100 mg via ORAL
  Filled 2020-09-25 (×6): qty 4

## 2020-09-25 MED ORDER — METOPROLOL TARTRATE 5 MG/5ML IV SOLN
2.5000 mg | Freq: Once | INTRAVENOUS | Status: AC
  Administered 2020-09-25: 2.5 mg via INTRAVENOUS
  Filled 2020-09-25: qty 5

## 2020-09-25 MED ORDER — LACTATED RINGERS IV BOLUS
500.0000 mL | Freq: Once | INTRAVENOUS | Status: AC
  Administered 2020-09-25: 500 mL via INTRAVENOUS

## 2020-09-25 MED ORDER — PANTOPRAZOLE SODIUM 40 MG PO TBEC
40.0000 mg | DELAYED_RELEASE_TABLET | Freq: Every day | ORAL | Status: DC
  Administered 2020-09-26 – 2020-09-28 (×3): 40 mg via ORAL
  Filled 2020-09-25 (×3): qty 1

## 2020-09-25 MED ORDER — STERILE WATER FOR INJECTION IJ SOLN
INTRAMUSCULAR | Status: AC
  Administered 2020-09-25: 1 mL
  Filled 2020-09-25: qty 10

## 2020-09-25 MED ORDER — ARIPIPRAZOLE 5 MG PO TABS
5.0000 mg | ORAL_TABLET | Freq: Every day | ORAL | Status: DC
  Administered 2020-09-26 – 2020-09-28 (×3): 5 mg via ORAL
  Filled 2020-09-25 (×3): qty 1

## 2020-09-25 MED ORDER — QUETIAPINE FUMARATE ER 50 MG PO TB24
150.0000 mg | ORAL_TABLET | Freq: Every day | ORAL | Status: DC
  Administered 2020-09-25 – 2020-09-27 (×3): 150 mg via ORAL
  Filled 2020-09-25 (×4): qty 3

## 2020-09-25 MED ORDER — ACETAMINOPHEN 650 MG RE SUPP: 650.0000 mg | Freq: Four times a day (QID) | RECTAL | Status: DC | PRN

## 2020-09-25 MED ORDER — APIXABAN 5 MG PO TABS
5.0000 mg | ORAL_TABLET | Freq: Two times a day (BID) | ORAL | Status: DC
  Administered 2020-09-25 – 2020-09-28 (×6): 5 mg via ORAL
  Filled 2020-09-25 (×6): qty 1

## 2020-09-25 MED ORDER — PANTOPRAZOLE SODIUM 40 MG IV SOLR
40.0000 mg | Freq: Once | INTRAVENOUS | Status: AC
  Administered 2020-09-25: 40 mg via INTRAVENOUS
  Filled 2020-09-25: qty 40

## 2020-09-25 MED ORDER — KETOTIFEN FUMARATE 0.025 % OP SOLN: 1.0000 [drp] | Freq: Two times a day (BID) | OPHTHALMIC | Status: DC

## 2020-09-25 MED ORDER — FLUTICASONE PROPIONATE 50 MCG/ACT NA SUSP
2.0000 | Freq: Every day | NASAL | Status: DC | PRN
  Filled 2020-09-25: qty 16

## 2020-09-25 MED ORDER — MAGNESIUM SULFATE 2 GM/50ML IV SOLN
2.0000 g | Freq: Once | INTRAVENOUS | Status: AC
  Administered 2020-09-25: 2 g via INTRAVENOUS
  Filled 2020-09-25: qty 50

## 2020-09-25 MED ORDER — ALPRAZOLAM 0.5 MG PO TABS
2.0000 mg | ORAL_TABLET | Freq: Two times a day (BID) | ORAL | Status: DC
  Administered 2020-09-25 – 2020-09-28 (×6): 2 mg via ORAL
  Filled 2020-09-25 (×6): qty 4

## 2020-09-25 MED ORDER — ACETAMINOPHEN 325 MG PO TABS
650.0000 mg | ORAL_TABLET | Freq: Four times a day (QID) | ORAL | Status: DC | PRN
  Administered 2020-09-27 – 2020-09-28 (×3): 650 mg via ORAL
  Filled 2020-09-25 (×3): qty 2

## 2020-09-25 MED ORDER — SODIUM CHLORIDE 0.9 % IV SOLN: INTRAVENOUS | Status: DC

## 2020-09-25 MED ORDER — POLYETHYLENE GLYCOL 3350 17 G PO PACK
17.0000 g | PACK | Freq: Every day | ORAL | Status: DC | PRN
  Administered 2020-09-26 – 2020-09-27 (×2): 17 g via ORAL
  Filled 2020-09-25 (×2): qty 1

## 2020-09-25 MED ORDER — LORAZEPAM 2 MG/ML IJ SOLN
1.0000 mg | Freq: Once | INTRAMUSCULAR | Status: AC
  Administered 2020-09-25: 1 mg via INTRAVENOUS
  Filled 2020-09-25: qty 1

## 2020-09-25 MED ORDER — METOPROLOL TARTRATE 25 MG PO TABS
100.0000 mg | ORAL_TABLET | Freq: Once | ORAL | Status: AC
  Administered 2020-09-25: 100 mg via ORAL
  Filled 2020-09-25: qty 4

## 2020-09-25 NOTE — ED Provider Notes (Signed)
[facility] EMERGENCY DEPARTMENT Provider Note   CSN: [id number] Arrival date & time: 09/25/20  1247     History Chief Complaint  Patient presents with  . Chest Pain    Karen Casey is a 65 y.o. female.  HPI Patient has history of alcohol abuse.  She was hospitalized with complicated alcohol and benzodiazepine withdrawal and atrial fibrillation with rapid ventricular response.  Patient was discharged on Eliquis and metoprolol.  Plan was to take Xanax twice daily and taper over 2 to 4 weeks.  Patient reports he is coming to the hospital today because she felt like her heart was racing out of her chest this morning.  She reports she also felt nauseated.  She reports she has been taking her medications as prescribed.  However, she does not seem well versed in her medications.  She reports that her sister puts him in a medication dispenser for her and she takes them as prescribed.  She reports she is having a lot of burning in her stomach as well.  She feels nauseated.  She reports she does have history of bad reflux.  She is not sure if she is taking her Prilosec.  I have reviewed the medication list she provided me and there is no indication that the Prilosec is being taken.  Patient is unclear.  Patient denies that she has been drinking any alcohol since she left the hospital.  He denies she is having a problem with lower extremity weakness or numbness.  No swelling of the lower extremities or calf pain.  She denies any falls or syncopal episodes.  Patient reports that she was concerned because her blood pressure was atypically low for her this morning.  She reports a systolic blood pressure was in the 130s.    Past Medical History:  Diagnosis Date  . Alcohol abuse   . Allergy   . Anxiety   . Cataract 06/09/2012   Right eye and left eye  . Depression   . GERD (gastroesophageal reflux disease)   . 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 Seizures  ([place])    xanax withdrawl- December 2013  . Urinary incontinence 06/09/2012    Patient Active Problem List   Diagnosis Date Noted  . Altered mental status   . Volume overload 09/21/2020  . Acute respiratory insufficiency   . Long term (current) use of anticoagulants   . Protein-calorie malnutrition ([place]) 09/17/2020  . Persistent atrial fibrillation ([place])   . Alcohol withdrawal seizure with delirium ([place])   . Status epilepticus ([place])   . Hyponatremia   . Paroxysmal atrial fibrillation with rapid ventricular response ([place])   . Shortness of breath   . Precordial pain   . Alcohol withdrawal ([place]) 09/10/2020  . Pulmonary nodules/lesions, multiple 07/14/2020  . Chronic left shoulder pain 06/22/2020  . Allergic conjunctivitis of both eyes 06/22/2020  . Hammer toe 03/06/2020  . HTN (hypertension) 06/02/2018  . Palpitations 06/02/2018  . Body mass index (BMI) 31.0-31.9, adult 03/29/2013  . Back pain 02/14/2013  . Anxiety disorder 06/12/2012  . Alcohol use 05/28/2012  . Irritable bowel syndrome 05/10/2012  . Tobacco use disorder 02/17/2012  . GERD (gastroesophageal reflux disease) 02/04/2012  . Depression 02/04/2012    Past Surgical History:  Procedure Laterality Date  . APPENDECTOMY    . CATARACT EXTRACTION  06/09/2012   Left eye  . left shoulder dislocation  Sept 2011     OB History  No obstetric history on file.     Family History  Problem Relation Age of Onset  . Hypertension Mother   . Hyperlipidemia Mother   . Aneurysm Mother        Rupture - Cause of death  . Heart disease Father        MI - cause of death  . Depression Father   . Parkinsonism Father   . Colon cancer Neg Hx   . Esophageal cancer Neg Hx   . Rectal cancer Neg Hx   . Stomach cancer Neg Hx     Social History   Tobacco Use  . Smoking status: Current Every Day Smoker    Packs/day: 1.00    Years: 43.00    Pack years: 43.00    Types: Cigarettes  . Smokeless tobacco: Never Used  Vaping  Use  . Vaping Use: Never used  Substance Use Topics  . Alcohol use: Yes    Alcohol/week: 3.0 standard drinks    Types: 3 Cans of beer per week    Comment: 2-3 times  . Drug use: Yes    Types: Marijuana, Other-see comments    Comment: Past hx of benzo abuse--xanax    Home Medications Prior to Admission medications   Medication Sig Start Date End Date Taking? Authorizing Provider  ALPRAZolam Duanne Moron) 1 MG tablet Take 2 tablets (2 mg total) by mouth 2 (two) times daily. 09/19/20   Lurline Del, DO  apixaban (ELIQUIS) 5 MG TABS tablet Take 1 tablet (5 mg total) by mouth 2 (two) times daily. 09/19/20   Lurline Del, DO  ARIPiprazole (ABILIFY) 5 MG tablet Take 5 mg by mouth daily. 05/14/20   [provider]  azelastine (OPTIVAR) 0.05 % ophthalmic solution Place 1 drop into both eyes 2 (two) times daily. 07/15/20   [name], MD  citalopram (CELEXA) 40 MG tablet Take 1 tablet (40 mg total) by mouth daily. 05/31/18   Alveda Reasons, MD  DULoxetine (CYMBALTA) 60 MG capsule Take 60 mg by mouth daily. 09/04/20   [provider]  fluticasone (FLONASE) 50 MCG/ACT nasal spray SHAKE LIQUID AND USE 2 SPRAYS IN EACH NOSTRIL DAILY Patient taking differently: Place 2 sprays into both nostrils daily as needed for allergies or rhinitis. 07/15/20   [name], MD  furosemide (LASIX) 20 MG tablet Take 1 tablet (20 mg total) by mouth daily. 09/22/20 10/22/20  Simmons-Robinson, Riki Sheer, MD  gabapentin (NEURONTIN) 600 MG tablet Take 2 tablets (1,200 mg total) by mouth 3 (three) times daily. 02/20/18   Alveda Reasons, MD  metoprolol tartrate (LOPRESSOR) 100 MG tablet Take 1 tablet (100 mg total) by mouth 2 (two) times daily. 09/19/20   Lurline Del, DO  Multiple Vitamins-Minerals (CENTRUM SILVER 50+WOMEN PO) Take 1 tablet by mouth daily.    [provider]  omeprazole (PRILOSEC) 20 MG capsule Take 1 capsule (20 mg total) by mouth daily. 09/22/20   Simmons-Robinson, Riki Sheer, MD   polyvinyl alcohol (LIQUITEARS) 1.4 % ophthalmic solution Place 3 drops into both eyes as needed for dry eyes. 03/04/20   Anderson, Chelsey L, DO  potassium chloride (KLOR-CON) 10 MEQ tablet Take 4 tablets (40 mEq total) by mouth daily. 09/22/20   Simmons-Robinson, Riki Sheer, MD  Probiotic Product (PROBIOTIC PO) Take 1 capsule by mouth daily.    [provider]  QUEtiapine Fumarate (SEROQUEL XR) 150 MG 24 hr tablet Take 150 mg by mouth at bedtime. 09/02/20   [provider]  tiZANidine (ZANAFLEX) 4  MG tablet Take 1 tablet (4 mg total) by mouth every 8 (eight) hours as needed for muscle spasms. 09/22/20   Simmons-Robinson, Riki Sheer, MD    Allergies    Patient has no known allergies.  Review of Systems   Review of Systems 10 systems reviewed and negative except as per HPI Physical Exam Updated Vital Signs BP (!) 165/96   Pulse 81   Temp 98.9 F (37.2 C) (Oral)   Resp 19   Ht 5\' 7"  (1.702 m)   Wt 86.2 kg   SpO2 98%   BMI 29.76 kg/m   Physical Exam Constitutional:      Comments: Patient is alert and nontoxic.  She does not have respiratory distress at rest.  No active tremor or appearance of hallucinations.  Heart rate is sinus rhythm on the monitor in the 70s.  Blood pressure is mildly elevated with systolic blood pressures in the 160s.  HENT:     Mouth/Throat:     Pharynx: Oropharynx is clear.  Eyes:     Extraocular Movements: Extraocular movements intact.     Pupils: Pupils are equal, round, and reactive to light.  Cardiovascular:     Rate and Rhythm: Normal rate and regular rhythm.  Pulmonary:     Effort: Pulmonary effort is normal.     Breath sounds: Normal breath sounds.  Abdominal:     General: There is no distension.     Palpations: Abdomen is soft.     Tenderness: There is no abdominal tenderness. There is no guarding.  Musculoskeletal:        General: No swelling or tenderness. Normal range of motion.     Right lower leg: No edema.     Left lower leg:  No edema.  Skin:    General: Skin is warm and dry.  Neurological:     General: No focal deficit present.     Mental Status: She is oriented to person, place, and time.     Cranial Nerves: No cranial nerve deficit.     Sensory: No sensory deficit.     Motor: No weakness.     Coordination: Coordination normal.     Comments: At this time, patient's mental status is clear.  She does not appear confused.  Finger-nose exam intact.  She is not having any active tremors.  Psychiatric:        Mood and Affect: Mood normal.     ED Results / Procedures / Treatments   Labs (all labs ordered are listed, but only abnormal results are displayed) Labs Reviewed  CBC WITH DIFFERENTIAL/PLATELET - Abnormal; Notable for the following components:      Result Value   Platelets 519 (*)    All other components within normal limits  COMPREHENSIVE METABOLIC PANEL  ETHANOL  LIPASE, BLOOD  LACTIC ACID, PLASMA  LACTIC ACID, PLASMA  URINALYSIS, ROUTINE W REFLEX MICROSCOPIC  RAPID URINE DRUG SCREEN, HOSP PERFORMED  MAGNESIUM  PHOSPHORUS  TSH  PROTIME-INR  TROPONIN I (HIGH SENSITIVITY)  TROPONIN I (HIGH SENSITIVITY)  TROPONIN I (HIGH SENSITIVITY)    EKG EKG Interpretation  Date/Time:  Thursday September 25 2020 13:27:37 EDT Ventricular Rate:  79 PR Interval:  155 QRS Duration: 77 QT Interval:  413 QTC Calculation: 474 R Axis:   32 Text Interpretation: Sinus rhythm Probable left atrial enlargement no sig change from previous Confirmed by Charlesetta Shanks [phone]) on 09/25/2020 3:04:56 PM   Radiology No results found.  Procedures Procedures   Medications Ordered in ED  Medications  lactated ringers bolus 500 mL (500 mLs Intravenous New Bag/Given 09/25/20 1441)  LORazepam (ATIVAN) injection 1 mg (1 mg Intravenous Given 09/25/20 1422)  pantoprazole (PROTONIX) injection 40 mg (40 mg Intravenous Given 09/25/20 1422)  metoprolol tartrate (LOPRESSOR) tablet 100 mg (100 mg Oral Given 09/25/20 1426)   metoprolol tartrate (LOPRESSOR) injection 2.5 mg (2.5 mg Intravenous Given 09/25/20 1424)  sterile water (preservative free) injection (1 mL  Given 09/25/20 1427)    ED Course  I have reviewed the triage vital signs and the nursing notes.  Pertinent labs & imaging results that were available during my care of the patient were reviewed by me and considered in my medical decision making (see chart for details).    MDM Rules/Calculators/A&P                          Patient presents with complaints of palpitations this morning.  At this time, patient has normal sinus rhythm.  Heart rate is controlled.  Blood pressure mildly elevated.  Patient is supposed to be on metoprolol.  She reports medication compliance and reports no alcohol use.  At this time will administer an IV dose of metoprolol for mild blood pressure elevation and an oral dose.  Will administer Ativan as the patient is supposed to be on a benzodiazepine with tapering effect at this time she is not showing signs of acute withdrawal currently.  She denies alcohol use since leaving the hospital.  He describes symptoms of GERD and gastritis.  Will administer Protonix and fluid bolus.  At this time diagnostic studies pending Dr. Laverta Baltimore to follow-up on diagnostic studies if patient remains stable with vital signs within normal limits for her baseline and no significant findings on diagnostic work-up, patient can be evaluated for possible discharge home.  If patient is not compliant with medications as prescribed, or she is using alcohol at home, consideration could be given to mild withdrawal symptoms but at this time no signs of delirium tremens, no seizure, no hallucinations and no vital sign instability. Final Clinical Impression(s) / ED Diagnoses Final diagnoses:  None    Rx / DC Orders ED Discharge Orders    None       Charlesetta Shanks, MD 09/25/20 [phone]

## 2020-09-25 NOTE — ED Triage Notes (Signed)
Patient BIB [facility] for evaluation of chest pain and "feeling like her heart is beating hard". Patient was recently discharged after a two week admission to hospital for ETOH withdrawal. Patient alert, oriented, and in no apparent distress at this time. Patient received 324mg  ASA and 1x SL NTG prior to arrive.

## 2020-09-25 NOTE — ED Provider Notes (Signed)
Blood pressure (!) 167/91, pulse 77, temperature 98.9 F (37.2 C), temperature source Oral, resp. rate (!) 23, height 5\' 7"  (1.702 m), weight 86.2 kg, SpO2 100 %.  Assuming care from Dr. Johnney Killian.  In short, Karen Casey is a 65 y.o. female with a chief complaint of Chest Pain .  Refer to the original H&P for additional details.  The current plan of care is to f/u on labs and CXR.   03:46 PM  Labs are resulting showing sodium of 122.  In comparison with old lab work she is typically in the low 130s.  Sodium at this level could explain her generalized weakness and fatigue symptoms.  Kidney function is within normal limits.  No significant leukocytosis.  Lactic acid is normal.  Troponin is normal.  Chest x-ray with some lingular scarring but no infiltrate.   Discussed patient's case with medicine to request admission. Patient and family (if present) updated with plan. Care transferred to medicine service.  I reviewed all nursing notes, vitals, pertinent old records, EKGs, labs, imaging (as available).     Margette Fast, MD 09/25/20 [phone]

## 2020-09-25 NOTE — H&P (Addendum)
[facility] Admission History and Physical Service Pager: [phone]  Patient name: Karen Casey Medical record number: [id number] Date of birth: [birth year] Age: 65 y.o. Gender: female  Primary Care Provider: [name], MD Consultants: none Code Status: FULL  Chief Complaint: palpitations  Assessment and Plan: Karen Casey is a 65 y.o. female presenting with fatigue, nausea, and palpitations found to have hyponatremia. PMH is significant for atrial fibrillation on apixaban, depression, anxiety, chronic benzodiazepine use, alcohol use disorder, tobacco use, pulmonary nodules, chronic hypoxemic respiratory failure.  Acute on chronic hypo-osmolar hyponatremia Patient presenting to the ED after recently being discharged from the hospital 3 days ago on 4/11 for hyponatremia (sodium 119) and alcohol withdrawal seizures requiring ICU admission with symptoms of fatigue, nausea, and palpitations with generally poor oral intake since being discharged. Clinically stable at this time.  ACS work-up was negative with normal and flat troponins.  EKG in NSR, not currently in atrial fibrillation.  Labs notable for hyponatremia with sodium 122 with hypoosmolality (calculated between 250-252 based on BMP, BUN was less than 5).  Her most recent sodium was 131 on day of discharge and had ranged from 128-132 in the few days prior to discharge.  Glucose essentially normal at 110 so no correction on sodium level.  Patient is neurologically intact at this time and mildly symptomatic from hyponatremia with generalized fatigue and nausea (which seems to be a chronic issue for her).  She has not had any headaches, vomiting, or seizures.  Suspect this is mostly due to poor oral intake.  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  Could also consider adrenal insufficiency. Given history of lung nodules and smoking history, could consider underlying  small cell carcinoma of the lung causing SIADH, though would be rare and nodules were interpreted as likely benign in January 2022.  Normal TSH rules out hypothyroidism as a cause.  Will admit for treatment of hyponatremia and further work-up. - admit to progressive, Dr. Gwendlyn Deutscher attending - mIVF NS 125 ml/hr - hold citalopram, duloxetine - BMP q4h, goal to correct no more than 8 mEq/24 hours - labs: serum osmolality, urine osmolality, urine Na/K, urine creatine - AM CBC, Mg - cardiology - vitals per unit routine - neuro checks q4h - strict I/O - PT/OT - consult CCM if clinically worsening - if Na worsening, consider hypertonic saline and nephrology consult  Dysuria Patient worsening symptoms of urinary frequency and dysuria.  Will test for UTI and treat as indicated.  No suprapubic discomfort on physical exam. - UA, urine culture  Hypomagnesemia Mg 1.3 on admission. - IV Mg 2g - AM Mg  Chest pain: Patient with some complaints of epigastric and chest pain in the emergency department.  During her last hospitalization this was thought to be mostly epigastric in nature as troponins and EKG at that time were not concerning and the patient did improve at that time with GI cocktail.  Troponins are flat x3 in the emergency department today. -Continuous cardiac monitoring -We will monitor closely  Palpitations/history of atrial fibrillation Patient was having palpitations this morning, but appears to be in normal sinus rhythm on EKG.  Not currently in atrial fibrillation.  Patient is on metoprolol tartrate 100 mg twice daily and apixaban 5 mg twice daily. -Continuous cardiac monitoring -Continue home medications  Hypertension: Blood pressure is elevated at 496P-591M systolic.  Patient not on any dedicated blood pressure medications though she is  on Lasix 20 mg daily since her last discharge for her increased oxygen requirement at that time as well as metoprolol tartrate which she takes for  rate control of atrial fibrillation for which she is not currently in. -We will continue to monitor -If indication patient may need additional blood pressure medication prior to discharge or may follow-up in the outpatient setting.  Depression/anxiety on chronic benzodiazepines Home medications include aripiprazole 5 mg daily, alprazolam 2 mg twice daily, citalopram 40 mg daily, duloxetine 60 mg daily, quetiapine 150 mg nightly.  Currently undergoing prolonged benzodiazepine taper.  Does not appear to be in benzodiazepine withdrawal at this time. - hold citalopram, duloxetine - continue aripriprazole, quetiapine, alprazolam  Alcohol use disorder Patient has a history of alcohol use disorder he was recently hospitalized for alcohol withdrawal seizures.  Patient denies alcohol use in the past few days since she was discharged.  Alcohol level undetectable in the ED. - monitor CIWA x 24 hours, no lorazepam  Chronic hypoxemic respiratory failure Patient previously had no oxygen requirement, but during most recent hospitalization, patient had an ambulatory pulse ox of 88% and so was discharged on 2 L of oxygen to be used as needed.  Patient was initially on 2 L of nasal cannula in the ED, but was breathing comfortably on room air during our assessment.  Patient was previously discharged on Lasix 20 mg daily when she was discharged with 2 L of oxygen.  Patient currently on room air satting well.  Patient without any peripheral edema.  Previous echo on 09/10/2020 with ejection fraction within normal limits and normal diastolic parameters. -Hold home Lasix -Continue to monitor  GERD Patient has had epigastric pain secondary to GERD he has been requesting a GI cocktail as needed during the most recent admission. - pantoprazole 40 mg daily - consider GI cocktail if symptoms worsening  Chronic pain: Patient with some complaints of chronic pain and has home medications of tizanidine and  gabapentin. -Acetaminophen as needed  Seasonal allergies: Home medications include Flonase -Continue home Flonase  FEN/GI: Regular diet Prophylaxis: apixaban  Disposition: progressive  History of Present Illness:  Karen Casey is a 65 y.o. female presenting with fatigue, nausea, and palpitations.  Patient was recently admitted to the hospital for hyponatremia and alcohol withdrawal related seizures requiring ICU admission and was just discharged 3 days ago on 4/11.  Patient was brought in by EMS this morning due to palpitations.  She also reports worsening nausea that started last night.  Also with generalized fatigue.  Patient states that she has had decreased appetite and has not had much to eat or drink in the few days that she has been at home.  She reports feeling more "foggy headed" for the past 2 weeks, but no change in the past few days.  She denies headache, vomiting, shortness of breath, diarrhea.  She denies hemoptysis.  She reports ongoing epigastric abdominal pain due to GERD as well as productive cough which is not unusual for her.  She does report being constipated, last BM was yesterday which was hard.  Patient denies any known recent alcohol use since she was discharged from the hospital.  Patient was brought in by EMS for evaluation of chest pain and received 324 mg of ASA and sublingual nitroglycerin x1 prior to arrival.  In the ED, VSS other than some mild hypertension.  She was given a dose of IV metoprolol and lorazepam.  She was clinically dry on exam in the ED  and was given a 500 cc LR bolus.  CXR notable for some lingular scarring, but no infiltrate.  Troponins were trended which were flat and normal.  EKG in NSR, no significant change from prior studies.  Labs notable for sodium 122, chloride 87, and magnesium 1.3.  Platelets slightly elevated at 519.  TSH obtained within normal limits.  Alcohol level undetectable.  Review Of Systems: Per HPI with the following  additions:   Review of Systems  Constitutional: Positive for malaise/fatigue. Negative for weight loss.  Respiratory: Positive for cough and sputum production. Negative for hemoptysis and shortness of breath.   Cardiovascular: Positive for palpitations. Negative for chest pain.  Gastrointestinal: Positive for abdominal pain, constipation, heartburn and nausea. Negative for diarrhea and vomiting.  Genitourinary: Positive for dysuria and frequency.  Musculoskeletal: Positive for back pain.  Neurological: Positive for weakness. Negative for headaches.  Psychiatric/Behavioral: Negative for substance abuse.    Patient Active Problem List   Diagnosis Date Noted  . Altered mental status   . Volume overload 09/21/2020  . Acute respiratory insufficiency   . Long term (current) use of anticoagulants   . Protein-calorie malnutrition ([place]) 09/17/2020  . Persistent atrial fibrillation ([place])   . Alcohol withdrawal seizure with delirium ([place])   . Status epilepticus ([place])   . Hyponatremia   . Paroxysmal atrial fibrillation with rapid ventricular response ([place])   . Shortness of breath   . Precordial pain   . Alcohol withdrawal ([place]) 09/10/2020  . Pulmonary nodules/lesions, multiple 07/14/2020  . Chronic left shoulder pain 06/22/2020  . Allergic conjunctivitis of both eyes 06/22/2020  . Hammer toe 03/06/2020  . HTN (hypertension) 06/02/2018  . Palpitations 06/02/2018  . Body mass index (BMI) 31.0-31.9, adult 03/29/2013  . Back pain 02/14/2013  . Anxiety disorder 06/12/2012  . Alcohol use 05/28/2012  . Irritable bowel syndrome 05/10/2012  . Tobacco use disorder 02/17/2012  . GERD (gastroesophageal reflux disease) 02/04/2012  . Depression 02/04/2012    Past Medical History: Past Medical History:  Diagnosis Date  . Alcohol abuse   . Allergy   . Anxiety   . Cataract 06/09/2012   Right eye and left eye  . Depression   . GERD (gastroesophageal reflux disease)   . 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   Event occurred in 2007  . Seizures ([place])    xanax withdrawl- December 2013  . Urinary incontinence 06/09/2012    Past Surgical History: Past Surgical History:  Procedure Laterality Date  . APPENDECTOMY    . CATARACT EXTRACTION  06/09/2012   Left eye  . left shoulder dislocation  Sept 2011    Social History: Social History   Tobacco Use  . Smoking status: Current Every Day Smoker    Packs/day: 1.00    Years: 43.00    Pack years: 43.00    Types: Cigarettes  . Smokeless tobacco: Never Used  Vaping Use  . Vaping Use: Never used  Substance Use Topics  . Alcohol use: Yes    Alcohol/week: 3.0 standard drinks    Types: 3 Cans of beer per week    Comment: 2-3 times  . Drug use: Yes    Types: Marijuana, Other-see comments    Comment: Past hx of benzo abuse--xanax   Additional social history: Patient denies recent alcohol use, previously was drinking 7 beers a day on average.  She reports a long history of smoking since age 44, previously was smoking 1 pack/day but has recently been smoking 1/4 pack/day. Please also  refer to relevant sections of EMR.  Family History: Family History  Problem Relation Age of Onset  . Hypertension Mother   . Hyperlipidemia Mother   . Aneurysm Mother        Rupture - Cause of death  . Heart disease Father        MI - cause of death  . Depression Father   . Parkinsonism Father   . Colon cancer Neg Hx   . Esophageal cancer Neg Hx   . Rectal cancer Neg Hx   . Stomach cancer Neg Hx     Allergies and Medications: No Known Allergies No current facility-administered medications on file prior to encounter.   Current Outpatient Medications on File Prior to Encounter  Medication Sig Dispense Refill  . ALPRAZolam (XANAX) 1 MG tablet Take 2 tablets (2 mg total) by mouth 2 (two) times daily. 56 tablet 0  . apixaban (ELIQUIS) 5 MG TABS tablet Take 1 tablet (5 mg total) by mouth 2 (two) times daily. 60 tablet 0  . ARIPiprazole  (ABILIFY) 5 MG tablet Take 5 mg by mouth daily.    Marland Kitchen azelastine (OPTIVAR) 0.05 % ophthalmic solution Place 1 drop into both eyes 2 (two) times daily. 6 mL 0  . citalopram (CELEXA) 40 MG tablet Take 1 tablet (40 mg total) by mouth daily. 90 tablet 3  . DULoxetine (CYMBALTA) 60 MG capsule Take 60 mg by mouth daily.    . fluticasone (FLONASE) 50 MCG/ACT nasal spray SHAKE LIQUID AND USE 2 SPRAYS IN EACH NOSTRIL DAILY (Patient taking differently: Place 2 sprays into both nostrils daily as needed for allergies or rhinitis.) 16 g 0  . furosemide (LASIX) 20 MG tablet Take 1 tablet (20 mg total) by mouth daily. 30 tablet 0  . gabapentin (NEURONTIN) 600 MG tablet Take 2 tablets (1,200 mg total) by mouth 3 (three) times daily. 180 tablet 2  . metoprolol tartrate (LOPRESSOR) 100 MG tablet Take 1 tablet (100 mg total) by mouth 2 (two) times daily. 60 tablet 0  . Multiple Vitamins-Minerals (CENTRUM SILVER 50+WOMEN PO) Take 1 tablet by mouth daily.    Marland Kitchen omeprazole (PRILOSEC) 20 MG capsule Take 1 capsule (20 mg total) by mouth daily. 30 capsule 0  . polyvinyl alcohol (LIQUITEARS) 1.4 % ophthalmic solution Place 3 drops into both eyes as needed for dry eyes. 30 mL 1  . potassium chloride (KLOR-CON) 10 MEQ tablet Take 4 tablets (40 mEq total) by mouth daily. 120 tablet 0  . Probiotic Product (PROBIOTIC PO) Take 1 capsule by mouth daily.    . QUEtiapine Fumarate (SEROQUEL XR) 150 MG 24 hr tablet Take 150 mg by mouth at bedtime.    Marland Kitchen tiZANidine (ZANAFLEX) 4 MG tablet Take 1 tablet (4 mg total) by mouth every 8 (eight) hours as needed for muscle spasms. 30 tablet 0    Objective: BP (!) 162/98 (BP Location: Right Arm)   Pulse 69   Temp 98.9 F (37.2 C) (Oral)   Resp 14   Ht 5\' 7"  (1.702 m)   Wt 86.2 kg   SpO2 98%   BMI 29.76 kg/m  Exam: General: Elderly female, lying in bed comfortably Eyes: PERRL, EOMI ENTM: MM somewhat dry Neck: Supple, no LAD Cardiovascular: RRR, no murmurs Respiratory: CTA B,  breathing comfortably on room air Gastrointestinal: Soft, tenderness to epigastric region, positive bowel sounds MSK: Mild tenderness to palpation to right upper back Derm: Warm, dry Neuro: Alert and oriented x4, CN II through XII intact, 5  out of 5 strength in all extremities, gross sensation intact, finger-to-nose intact Psych: Affect appropriate  Labs and Imaging: CBC BMET  Recent Labs  Lab 09/25/20 1409  WBC 10.2  HGB 14.4  HCT 40.6  PLT 519*   Recent Labs  Lab 09/25/20 1409  NA 122*  K 4.4  CL 87*  CO2 25  BUN <5*  CREATININE 0.53  GLUCOSE 110*  CALCIUM 9.0     DG Chest 2 View  Result Date: 09/25/2020 CLINICAL DATA:  Chest pain. EXAM: CHEST - 2 VIEW COMPARISON:  September 20, 2020. FINDINGS: Stable cardiomediastinal silhouette. No pneumothorax or pleural effusion is noted. Stable left lingular subsegmental atelectasis or scarring is noted. Right lung is clear. Bony thorax is unremarkable. IMPRESSION: Stable left lingular subsegmental atelectasis or scarring. Electronically Signed   By: Marijo Conception M.D.   On: 09/25/2020 15:23     Zola Button, MD 09/25/2020, 5:21 PM PGY-1, [facility] pager: [phone], text pages welcome   Upper Level Addendum:  I have seen and evaluated this patient along with Dr. Nancy Fetter and reviewed the above note, making necessary revisions as appropriate.  I agree with the medical decision making and physical exam as noted above.  Lurline Del, DO PGY-2  [facility]

## 2020-09-25 NOTE — Progress Notes (Signed)
Spoke with CCM provider on call given hyponatremia and recent ICU admission for hyponatremia and alcohol withdrawal seizures.  Neurologically intact at this time.  Advised to call back if clinical worsening, no need for CCM evaluation at this time.

## 2020-09-25 NOTE — Progress Notes (Signed)
Pt admitted to 5W37 from ED. A&O x4; VS stable. No pain. Tele monitor placed & verified. IV L hand, SL. Skin intact except where otherwise charted. All questions/concerns addressed. Call bell within reach. Will continue to monitor.   09/25/20 1910  Vitals  Temp 98 F (36.7 C)  Temp Source Axillary  BP (!) 176/94  MAP (mmHg) 119  BP Location Left Arm  BP Method Automatic  Patient Position (if appropriate) Lying  ECG Heart Rate 70  Resp 20  Level of Consciousness  Level of Consciousness Alert  MEWS COLOR  MEWS Score Color Green  Oxygen Therapy  SpO2 97 %  O2 Device Room Air  Pain Assessment  Pain Score 0  MEWS Score  MEWS Temp 0  MEWS Systolic 0  MEWS Pulse 0  MEWS RR 0  MEWS LOC 0  MEWS Score 0

## 2020-09-25 NOTE — Progress Notes (Addendum)
Received page from nurse regarding critical lab result: Serum osmolality of 248.   Given urine osmolality of 564, likely SIADH.   - awaiting BMP for current serum Na (in process) - depending on serum Na, will likely restrict fluids  ADDENDUM Critical serum sodium value of 119. Currently speaking with nephro.   Ezequiel Essex, MD

## 2020-09-25 NOTE — ED Notes (Signed)
Attempted to call report to floor 

## 2020-09-25 NOTE — Progress Notes (Signed)
Patient's lab valued returned.  Na is 119 down from 122, although at the time the labs were drawn she had only received 1/2L LR and not the NS 148ml/hr IVF.  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  Serum osm was low at 248, urine osm 564, and urine Na was 188.    Spoke with the nephrologist on-call tonight, Dr. Marval Regal, in regards to this.  He states it could be SIADH but administration of fluids can alter the lab values.  He recommended continuing NS IVF and if serum Na at next lab check he would recommend giving 15mg  tolvaptan and stopping fluids or transferring to ICU for hypertonic saline.

## 2020-09-25 NOTE — Progress Notes (Signed)
Got a page from Justin regarding CIWA score of 7. No PRN ativan on order. Scheduled BID xanax due at 10pm, told RN we could give it a little early. RN reports patient told him she normally drinks 7 beers a day at home and her last drink was Monday.   Will give scheduled Xanax a little early. Continue to monitor.   Ezequiel Essex, MD

## 2020-09-25 NOTE — Progress Notes (Addendum)
Received page with critical Na 119 again. This lab was drawn only 30 minutes after starting NS mIVF. Hopeful for next lab to be drawn q4 hours as ordered.   Will await repeat BMP for sodium after several hours of NS mIVF.    Ezequiel Essex, MD

## 2020-09-25 NOTE — ED Notes (Signed)
Attempted to collect urine. Pt urinated on self before sample could be collected. Pur Wik placed.

## 2020-09-25 NOTE — Progress Notes (Signed)
FPTS Interim Progress Note  S:Night team assessed Ms. Edman per routine. She was found in bed, appearing slightly diaphoretic. Reports nausea and hunger. Wonders if she can get food. Appears to have slowed speech and mentation.   O: BP (!) 176/94 (BP Location: Left Arm)   Pulse 69   Temp 98 F (36.7 C) (Axillary)   Resp 20   Ht 5\' 7"  (1.702 m)   Wt 86.2 kg   SpO2 97%   BMI 29.76 kg/m   Gen: awake, responsive, slowed speech and mentation, slightly diaphoretic Cards: RRR, no murmur appreciated Resp: CTAB Abd: soft, non-tender   A/P: - continue with scheduled xanax taper - admission team held home celexa and cymbalta on admission - CIWA q4 x24 hours - goal for sodium correction= 8-10 in 24 hours - BMP q4  Ezequiel Essex, MD 09/25/2020, 9:37 PM PGY-1, [place] [facility] Service pager [phone]

## 2020-09-26 DIAGNOSIS — R0789 Other chest pain: Secondary | ICD-10-CM | POA: Diagnosis not present

## 2020-09-26 DIAGNOSIS — E871 Hypo-osmolality and hyponatremia: Secondary | ICD-10-CM | POA: Diagnosis present

## 2020-09-26 DIAGNOSIS — K219 Gastro-esophageal reflux disease without esophagitis: Secondary | ICD-10-CM | POA: Diagnosis present

## 2020-09-26 DIAGNOSIS — Z7901 Long term (current) use of anticoagulants: Secondary | ICD-10-CM | POA: Diagnosis not present

## 2020-09-26 DIAGNOSIS — F1010 Alcohol abuse, uncomplicated: Secondary | ICD-10-CM | POA: Diagnosis present

## 2020-09-26 DIAGNOSIS — T43225A Adverse effect of selective serotonin reuptake inhibitors, initial encounter: Secondary | ICD-10-CM | POA: Diagnosis present

## 2020-09-26 DIAGNOSIS — R300 Dysuria: Secondary | ICD-10-CM | POA: Diagnosis present

## 2020-09-26 DIAGNOSIS — I10 Essential (primary) hypertension: Secondary | ICD-10-CM | POA: Diagnosis present

## 2020-09-26 DIAGNOSIS — I4819 Other persistent atrial fibrillation: Secondary | ICD-10-CM | POA: Diagnosis present

## 2020-09-26 DIAGNOSIS — I480 Paroxysmal atrial fibrillation: Secondary | ICD-10-CM | POA: Diagnosis not present

## 2020-09-26 DIAGNOSIS — F17210 Nicotine dependence, cigarettes, uncomplicated: Secondary | ICD-10-CM | POA: Diagnosis present

## 2020-09-26 DIAGNOSIS — Z20822 Contact with and (suspected) exposure to covid-19: Secondary | ICD-10-CM | POA: Diagnosis present

## 2020-09-26 DIAGNOSIS — R918 Other nonspecific abnormal finding of lung field: Secondary | ICD-10-CM | POA: Diagnosis present

## 2020-09-26 DIAGNOSIS — T43215A Adverse effect of selective serotonin and norepinephrine reuptake inhibitors, initial encounter: Secondary | ICD-10-CM | POA: Diagnosis present

## 2020-09-26 DIAGNOSIS — G40909 Epilepsy, unspecified, not intractable, without status epilepticus: Secondary | ICD-10-CM | POA: Diagnosis present

## 2020-09-26 DIAGNOSIS — K5900 Constipation, unspecified: Secondary | ICD-10-CM | POA: Diagnosis present

## 2020-09-26 DIAGNOSIS — J9611 Chronic respiratory failure with hypoxia: Secondary | ICD-10-CM | POA: Diagnosis present

## 2020-09-26 DIAGNOSIS — R1013 Epigastric pain: Secondary | ICD-10-CM | POA: Diagnosis present

## 2020-09-26 DIAGNOSIS — Z8249 Family history of ischemic heart disease and other diseases of the circulatory system: Secondary | ICD-10-CM | POA: Diagnosis not present

## 2020-09-26 DIAGNOSIS — F32A Depression, unspecified: Secondary | ICD-10-CM | POA: Diagnosis present

## 2020-09-26 DIAGNOSIS — F419 Anxiety disorder, unspecified: Secondary | ICD-10-CM | POA: Diagnosis present

## 2020-09-26 DIAGNOSIS — Z818 Family history of other mental and behavioral disorders: Secondary | ICD-10-CM | POA: Diagnosis not present

## 2020-09-26 DIAGNOSIS — E222 Syndrome of inappropriate secretion of antidiuretic hormone: Secondary | ICD-10-CM | POA: Diagnosis present

## 2020-09-26 DIAGNOSIS — Z79899 Other long term (current) drug therapy: Secondary | ICD-10-CM | POA: Diagnosis not present

## 2020-09-26 DIAGNOSIS — G8929 Other chronic pain: Secondary | ICD-10-CM | POA: Diagnosis present

## 2020-09-26 LAB — TROPONIN I (HIGH SENSITIVITY)
Troponin I (High Sensitivity): 6 ng/L (ref <18)
Troponin I (High Sensitivity): 6 ng/L (ref <18)

## 2020-09-26 LAB — BASIC METABOLIC PANEL
Anion gap: 8 (ref 5–15)
Anion gap: 9 (ref 5–15)
Anion gap: 11 (ref 5–15)
BUN: <5 mg/dL — ABNORMAL LOW (ref 8–23)
BUN: <5 mg/dL — ABNORMAL LOW (ref 8–23)
BUN: <5 mg/dL — ABNORMAL LOW (ref 8–23)
CO2: 26 mmol/L (ref 22–32)
CO2: 25 mmol/L (ref 22–32)
CO2: 24 mmol/L (ref 22–32)
Calcium: 8.8 mg/dL — ABNORMAL LOW (ref 8.9–10.3)
Calcium: 8.9 mg/dL (ref 8.9–10.3)
Calcium: 9 mg/dL (ref 8.9–10.3)
Chloride: 85 mmol/L — ABNORMAL LOW (ref 98–111)
Chloride: 86 mmol/L — ABNORMAL LOW (ref 98–111)
Chloride: 86 mmol/L — ABNORMAL LOW (ref 98–111)
Creatinine, Ser: 0.48 mg/dL (ref 0.44–1.00)
Creatinine, Ser: 0.42 mg/dL — ABNORMAL LOW (ref 0.44–1.00)
Creatinine, Ser: 0.47 mg/dL (ref 0.44–1.00)
GFR, Estimated: >60 mL/min (ref >60)
GFR, Estimated: >60 mL/min (ref >60)
GFR, Estimated: >60 mL/min (ref >60)
Glucose, Bld: 115 mg/dL — ABNORMAL HIGH (ref 70–99)
Glucose, Bld: 109 mg/dL — ABNORMAL HIGH (ref 70–99)
Glucose, Bld: 118 mg/dL — ABNORMAL HIGH (ref 70–99)
Potassium: 3.8 mmol/L (ref 3.5–5.1)
Potassium: 3.9 mmol/L (ref 3.5–5.1)
Potassium: 3.9 mmol/L (ref 3.5–5.1)
Sodium: 119 mmol/L — CL (ref 135–145)
Sodium: 120 mmol/L — ABNORMAL LOW (ref 135–145)
Sodium: 121 mmol/L — ABNORMAL LOW (ref 135–145)

## 2020-09-26 LAB — CBC
HCT: 40.2 % (ref 36.0–46.0)
Hemoglobin: 14.4 g/dL (ref 12.0–15.0)
MCH: 32.4 pg (ref 26.0–34.0)
MCHC: 35.8 g/dL (ref 30.0–36.0)
MCV: 90.3 fL (ref 80.0–100.0)
Platelets: 444 10*3/uL — ABNORMAL HIGH (ref 150–400)
RBC: 4.45 MIL/uL (ref 3.87–5.11)
RDW: 11.9 % (ref 11.5–15.5)
WBC: 9.7 10*3/uL (ref 4.0–10.5)
nRBC: 0 % (ref 0.0–0.2)

## 2020-09-26 LAB — HEPATIC FUNCTION PANEL
ALT: 28 U/L (ref 0–44)
AST: 32 U/L (ref 15–41)
Albumin: 3.1 g/dL — ABNORMAL LOW (ref 3.5–5.0)
Alkaline Phosphatase: 99 U/L (ref 38–126)
Bilirubin, Direct: 0.1 mg/dL (ref 0.0–0.2)
Indirect Bilirubin: 0.2 mg/dL — ABNORMAL LOW (ref 0.3–0.9)
Total Bilirubin: 0.3 mg/dL (ref 0.3–1.2)
Total Protein: 6.3 g/dL — ABNORMAL LOW (ref 6.5–8.1)

## 2020-09-26 LAB — SODIUM
Sodium: 124 mmol/L — ABNORMAL LOW (ref 135–145)
Sodium: 129 mmol/L — ABNORMAL LOW (ref 135–145)
Sodium: 129 mmol/L — ABNORMAL LOW (ref 135–145)

## 2020-09-26 LAB — MAGNESIUM: Magnesium: 1.7 mg/dL (ref 1.7–2.4)

## 2020-09-26 MED ORDER — MAGNESIUM SULFATE 2 GM/50ML IV SOLN
2.0000 g | Freq: Once | INTRAVENOUS | Status: AC
  Administered 2020-09-26: 2 g via INTRAVENOUS
  Filled 2020-09-26: qty 50

## 2020-09-26 MED ORDER — DEXTROSE 5 % IV SOLN: INTRAVENOUS | Status: DC

## 2020-09-26 MED ORDER — ALUM & MAG HYDROXIDE-SIMETH 200-200-20 MG/5ML PO SUSP
30.0000 mL | Freq: Once | ORAL | Status: AC
  Administered 2020-09-26: 30 mL via ORAL
  Filled 2020-09-26: qty 30

## 2020-09-26 MED ORDER — UREA 15 G PO PACK
15.0000 g | PACK | Freq: Two times a day (BID) | ORAL | Status: DC
  Administered 2020-09-26: 15 g via ORAL
  Filled 2020-09-26 (×2): qty 1

## 2020-09-26 MED ORDER — TOLVAPTAN 15 MG PO TABS
15.0000 mg | ORAL_TABLET | Freq: Once | ORAL | Status: AC
  Administered 2020-09-26: 15 mg via ORAL
  Filled 2020-09-26: qty 1

## 2020-09-26 MED ORDER — UREA 15 G PO PACK: 15.0000 g | PACK | Freq: Every day | ORAL | Status: DC

## 2020-09-26 NOTE — Evaluation (Signed)
Occupational Therapy Evaluation Patient Details Name: Karen Casey MRN: [id number] DOB: [birth year] Today's Date: 09/26/2020    History of Present Illness Pt is 65 yo admitted 3/29 after found with AMS and covered in feces at home. Pt was taken for head CT during which she had a Sz and required intubation 3/29. Extubated 4/1. PMhx: ETOH abuse, depression/anxiety, GERD, IBS, HTN, urinary incontinence   Clinical Impression   PTA, pt lives with significant other and since previous admission, had been using RW for mobility and reports increasing difficulty with ADLs due to fatigue/weakness. Pt reports wearing O2 prn at home. Pt presents with deficits in strength, endurance, standing balance, and knowledge of DME use. Pt initially resistant to OOB activities but able to demo mobility in room with RW at min guard. Pt did report chest pain after mobility though did not appear to be severe - RN aware and VSS on RA. Pt Setup for UB ADLs and min guard for LB ADLs due to deficits - similar to functional abilities with recent DC home. Recommend HHOT follow-up to maximize safety and independence with ADLs. Pt reports her significant other can assist her as well.    Follow Up Recommendations  Home health OT;Supervision - Intermittent    Equipment Recommendations  None recommended by OT (received RW and BSC with previous admission)    Recommendations for Other Services       Precautions / Restrictions Precautions Precautions: Fall Precaution Comments: watch sats Restrictions Weight Bearing Restrictions: No      Mobility Bed Mobility Overal bed mobility: Needs Assistance Bed Mobility: Supine to Sit     Supine to sit: Supervision;HOB elevated     General bed mobility comments: increased time/effort    Transfers Overall transfer level: Needs assistance Equipment used: Rolling walker (2 wheeled) Transfers: Sit to/from Stand Sit to Stand: Min guard         General transfer comment:  min guard for power up, no physical assist needed. Pt did require cues for RW navigation around obstacles    Balance Overall balance assessment: Needs assistance Sitting-balance support: No upper extremity supported;Feet supported Sitting balance-Leahy Scale: Good     Standing balance support: Bilateral upper extremity supported;No upper extremity supported Standing balance-Leahy Scale: Fair                             ADL either performed or assessed with clinical judgement   ADL Overall ADL's : Needs assistance/impaired Eating/Feeding: Independent;Sitting Eating/Feeding Details (indicate cue type and reason): lunch in recliner Grooming: Supervision/safety;Standing   Upper Body Bathing: Set up;Sitting   Lower Body Bathing: Min guard;Sit to/from stand   Upper Body Dressing : Set up;Sitting   Lower Body Dressing: Min guard;Sit to/from stand   Toilet Transfer: Min guard;Ambulation;RW   Toileting- Clothing Manipulation and Hygiene: Min guard;Sitting/lateral lean;Sit to/from stand       Functional mobility during ADLs: Min guard;Rolling walker;Cueing for sequencing General ADL Comments: Minor deficits in cognition, endurance, and standing balance. Pt reporting fatigue, chest pain after ambulation and a bit agitated at therapy attempts today     Vision Baseline Vision/History: Wears glasses Wears Glasses: Reading only Patient Visual Report: No change from baseline Vision Assessment?: No apparent visual deficits     Perception     Praxis      Pertinent Vitals/Pain Pain Assessment: Faces Faces Pain Scale: Hurts a little bit Pain Location: low back, then reported chest pain at end of  session (RN aware, VSS) Pain Descriptors / Indicators: Sore Pain Intervention(s): Monitored during session;Other (comment) (notified RN)     Hand Dominance Right   Extremity/Trunk Assessment Upper Extremity Assessment Upper Extremity Assessment: Overall WFL for tasks  assessed   Lower Extremity Assessment Lower Extremity Assessment: Defer to PT evaluation   Cervical / Trunk Assessment Cervical / Trunk Assessment: Kyphotic   Communication Communication Communication: No difficulties   Cognition Arousal/Alertness: Awake/alert Behavior During Therapy: Flat affect;Agitated Overall Cognitive Status: Impaired/Different from baseline Area of Impairment: Problem solving;Awareness;Safety/judgement                         Safety/Judgement: Decreased awareness of deficits Awareness: Emergent Problem Solving: Slow processing;Requires verbal cues;Requires tactile cues General Comments: slower processing and problem solving. some decreased awareness of deficits. flat affect and agitated at OOB activities   General Comments  VSS on RA    Exercises     Shoulder Instructions      Home Living Family/patient expects to be discharged to:: Private residence Living Arrangements: Spouse/significant other Available Help at Discharge: Family;Available 24 hours/day Type of Home: Apartment Home Access: Level entry     Home Layout: One level     Bathroom Shower/Tub: Teacher, early years/pre: Standard Bathroom Accessibility: Yes   Home Equipment: Walker - 2 wheels;Bedside commode;Other (comment) (pulse ox)          Prior Functioning/Environment Level of Independence: Independent with assistive device(s)        Comments: Using RW received from previous admission for mobility. reports using O2 prn at home. Increasing difficulty with ADLs, reports did not feel well enough to attempt showering tasks since being home.        OT Problem List: Decreased strength;Decreased activity tolerance;Impaired balance (sitting and/or standing);Decreased cognition;Decreased knowledge of use of DME or AE;Cardiopulmonary status limiting activity      OT Treatment/Interventions: Self-care/ADL training;Therapeutic exercise;Energy conservation;DME  and/or AE instruction;Therapeutic activities;Patient/family education;Balance training    OT Goals(Current goals can be found in the care plan section) Acute Rehab OT Goals Patient Stated Goal: get back in bed OT Goal Formulation: With patient Time For Goal Achievement: 10/10/20 Potential to Achieve Goals: Good ADL Goals Pt Will Perform Grooming: with modified independence;standing Pt Will Perform Lower Body Dressing: with modified independence;sitting/lateral leans;sit to/from stand Pt Will Transfer to Toilet: with modified independence;ambulating Pt Will Perform Toileting - Clothing Manipulation and hygiene: with modified independence;sit to/from stand;sitting/lateral leans Pt/caregiver will Perform Home Exercise Program: Increased strength;Both right and left upper extremity;With theraband;Independently;With written HEP provided  OT Frequency: Min 2X/week   Barriers to D/C:            Co-evaluation              AM-PAC OT "6 Clicks" Daily Activity     Outcome Measure Help from another person eating meals?: None Help from another person taking care of personal grooming?: A Little Help from another person toileting, which includes using toliet, bedpan, or urinal?: A Little Help from another person bathing (including washing, rinsing, drying)?: A Little Help from another person to put on and taking off regular upper body clothing?: A Little Help from another person to put on and taking off regular lower body clothing?: A Little 6 Click Score: 19   End of Session Equipment Utilized During Treatment: Rolling walker;Gait belt Nurse Communication: Mobility status;Other (comment) (chest pain)  Activity Tolerance: Patient tolerated treatment well Patient left: in chair;with call bell/phone  within reach;with chair alarm set  OT Visit Diagnosis: Unsteadiness on feet (R26.81);Other abnormalities of gait and mobility (R26.89);Muscle weakness (generalized) (M62.81);Other symptoms and  signs involving cognitive function                Time: 5894-8347 OT Time Calculation (min): 22 min Charges:  OT General Charges $OT Visit: 1 Visit OT Evaluation $OT Eval Low Complexity: 1 Low  Malachy Chamber, OTR/L Acute Rehab Services Office: [phone]  Layla Maw 09/26/2020, 12:44 PM

## 2020-09-26 NOTE — Progress Notes (Signed)
Na still 129 and stable, inc d5w rate to 100cc/hr, repeat na in 4 hrs Updated RN Gean Quint, MD [facility]

## 2020-09-26 NOTE — Progress Notes (Signed)
OT Cancellation Note  Patient Details Name: Karen Casey MRN: [id number] DOB: [birth year]   Cancelled Treatment:    Reason Eval/Treat Not Completed: Patient declined, no reason specified. Pt declined any OOB attempts at this time, citing "feeling too weak to stand up today" and not having any breakfast yet. This pt familiar to this OT from previous admission. Will follow-up again as schedule permits.  Layla Maw 09/26/2020, 8:49 AM

## 2020-09-26 NOTE — Consult Note (Signed)
NAME:  Karen Casey, MRN:  [id number], DOB:  09-19-1955, LOS: 0 ADMISSION DATE:  09/25/2020, CONSULTATION DATE:  09/26/2020 REFERRING MD:  Dr Jeani Hawking, CHIEF COMPLAINT:  Hyponatremia   History of Present Illness:  Consult was called for hyponatremia, SIADH, AMS  Patient with a history of seizure disorder, GERD, hypertension, EtOH abuse Came into the hospital with feeling unwell with fluttering heart/palpitations, some chest discomfort  Admitted 4/14 with sodium of 122, current sodium is 119  Concern for altered mentation Patient was appropriate for me, was sleepy, earlier received Ativan and seroquel Stated she feels better, heart not fluttering like before.  Currently being treated for possible SIADH -High urine osmolality, urine sodium -Was given lactated Ringer's, given normal saline  -Reason SIADH may be Cymbalta, beer potomaniar  She was recently hospitalized and treated for alcohol withdrawal seizures, required  Pertinent  Medical History   Past Medical History:  Diagnosis Date  . Alcohol abuse   . Allergy   . Anxiety   . Cataract 06/09/2012   Right eye and left eye  . Depression   . GERD (gastroesophageal reflux disease)   . 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 Seizures ([place])    xanax withdrawl- December 2013  . Urinary incontinence 06/09/2012     Significant Hospital Events: Including procedures, antibiotic start and stop dates in addition to other pertinent events   .   Interim History / Subjective:  Admitted to medical floor States she feels better Denies any pain or discomfort  Objective   Blood pressure (!) 144/72, pulse 70, temperature 98.1 F (36.7 C), temperature source Axillary, resp. rate 20, height 5\' 7"  (1.702 m), weight 86.2 kg, SpO2 96 %.       No intake or output data in the 24 hours ending 09/26/20 0510 Filed Weights   09/25/20 1435  Weight: 86.2 kg    Examination: General: Middle-age lady, does not appear  to be in distress HENT: Moist oral mucosa Lungs: Clear breath sounds bilaterally Cardiovascular: S1-S2 appreciated Abdomen: Soft, bowel sounds appreciated Extremities: No edema, no clubbing Neuro: Alert and oriented to place, person GU:   Labs/imaging that I havepersonally reviewed  (right click and "Reselect all SmartList Selections" daily)  Reviewed labs-chemistry, urine electrolytes, chest x-ray  Resolved Hospital Problem list     Assessment & Plan:  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  No neurological focality at present .  Appropriate in conversation .  May be a little sleepy from Seroquel and benzodiazepine  -Fluid restriction- <1L -Start tolvaptan -Check serum sodium in 8 hours and every 8  .  Renal following .  CCM will follow  I do not believe there is any clinical worsening from earlier descriptions of her presentation . fluid restriction . start tolvaptan and monitor  Will consider for transfer if any neurological changes for hypertonic saline infusion  Atrial fibrillation Hypertension Recent history of seizures  Best practice (right click and "Reselect all SmartList Selections" daily)  Diet:  Oral Pain/Anxiety/Delirium protocol (if indicated): No VAP protocol (if indicated): Not indicated DVT prophylaxis: Systemic AC GI prophylaxis: PPI Glucose control:  SSI No Central venous access:  N/A Arterial line:  N/A Foley:  N/A Mobility:  OOB  PT consulted: N/A Last date of multidisciplinary goals of care discussion [per primary] Code Status:  full code Disposition: med surg  Labs   CBC: Recent Labs  Lab 09/20/20 0327  09/21/20 0306 09/22/20 0351 09/25/20 1409 09/26/20 0307  WBC 9.8 10.7* 11.7* 10.2 9.7  NEUTROABS  --   --   --  7.1  --   HGB 12.6 12.8 13.1 14.4 14.4  HCT 38.2 38.6 38.8 40.6 40.2  MCV 99.5 99.7 97.5 91.9 90.3  PLT 251 266 302 519* 444*    Basic Metabolic  Panel: Recent Labs  Lab 09/20/20 0700 09/21/20 0306 09/25/20 1409 09/25/20 2032 09/25/20 2155 09/26/20 0307  NA  --  131* 122* 119* 119* 119*  K  --  3.7 4.4 4.0 4.0 3.8  CL  --  87* 87* 85* 83* 85*  CO2  --  38* 25 26 29 26   GLUCOSE  --  116* 110* 105* 106* 115*  BUN  --  6* <5* <5* <5* <5*  CREATININE  --  0.58 0.53 0.49 0.52 0.48  CALCIUM  --  9.3 9.0 9.1 9.1 8.8*  MG 2.0 1.8 1.3*  --   --   --   PHOS  --   --  3.3  --   --   --    GFR: Estimated Creatinine Clearance: 82.2 mL/min (by C-G formula based on SCr of 0.48 mg/dL). Recent Labs  Lab 09/21/20 0306 09/22/20 0351 09/25/20 1409 09/25/20 1425 09/26/20 0307  WBC 10.7* 11.7* 10.2  --  9.7  LATICACIDVEN  --   --   --  0.9  --     Liver Function Tests: Recent Labs  Lab 09/25/20 1409  AST 31  ALT 31  ALKPHOS 95  BILITOT 0.4  PROT 6.9  ALBUMIN 3.3*   Recent Labs  Lab 09/25/20 1409  LIPASE 39   No results for input(s): AMMONIA in the last 168 hours.  ABG    Component Value Date/Time   PHART 7.344 (L) 09/12/2020 1855   PCO2ART 48.0 09/12/2020 1855   PO2ART 82 (L) 09/12/2020 1855   HCO3 26.2 09/12/2020 1855   TCO2 28 09/12/2020 1855   O2SAT 95.0 09/12/2020 1855     Coagulation Profile: Recent Labs  Lab 09/25/20 1429  INR 1.3*    Cardiac Enzymes: No results for input(s): CKTOTAL, CKMB, CKMBINDEX, TROPONINI in the last 168 hours.  HbA1C: Hgb A1c MFr Bld  Date/Time Value Ref Range Status  09/17/2020 02:08 AM 5.2 4.8 - 5.6 % Final    Comment:    (NOTE) Pre diabetes:          5.7%-6.4%  Diabetes:              >6.4%  Glycemic control for   <7.0% adults with diabetes   06/20/2020 02:31 PM 5.3 4.8 - 5.6 % Final    Comment:             Prediabetes: 5.7 - 6.4          Diabetes: >6.4          Glycemic control for adults with diabetes: <7.0     CBG: No results for input(s): GLUCAP in the last 168 hours.  Review of Systems:   Denies pain or discomfort Denies any nausea No  headache  Past Medical History:  She,  has a past medical history of Alcohol abuse, Allergy, Anxiety, Cataract (06/09/2012), Depression, GERD (gastroesophageal reflux disease), Rupture of appendix (06/09/2012), Seizures ([place]), and Urinary incontinence (06/09/2012).   Surgical History:   Past Surgical History:  Procedure Laterality Date  . APPENDECTOMY    . CATARACT EXTRACTION  06/09/2012   Left eye  .  left shoulder dislocation  Sept 2011     Social History:   reports that she has been smoking cigarettes. She has a 43.00 pack-year smoking history. She has never used smokeless tobacco. She reports current alcohol use of about 3.0 standard drinks of alcohol per week. She reports current drug use. Drugs: Marijuana and Other-see comments.   Family History:  Her family history includes Aneurysm in her mother; Depression in her father; Heart disease in her father; Hyperlipidemia in her mother; Hypertension in her mother; Parkinsonism in her father. There is no history of Colon cancer, Esophageal cancer, Rectal cancer, or Stomach cancer.   Allergies No Known Allergies   Home Medications  Prior to Admission medications   Medication Sig Start Date End Date Taking? Authorizing Provider  ALPRAZolam Duanne Moron) 1 MG tablet Take 2 tablets (2 mg total) by mouth 2 (two) times daily. 09/19/20  Yes Welborn, Ryan, DO  apixaban (ELIQUIS) 5 MG TABS tablet Take 1 tablet (5 mg total) by mouth 2 (two) times daily. 09/19/20  Yes Welborn, Ryan, DO  ARIPiprazole (ABILIFY) 5 MG tablet Take 5 mg by mouth daily. 05/14/20  Yes [provider]  citalopram (CELEXA) 40 MG tablet Take 1 tablet (40 mg total) by mouth daily. 05/31/18  Yes Alveda Reasons, MD  DULoxetine (CYMBALTA) 60 MG capsule Take 60 mg by mouth daily. 09/04/20  Yes [provider]  fluticasone (FLONASE) 50 MCG/ACT nasal spray SHAKE LIQUID AND USE 2 SPRAYS IN EACH NOSTRIL DAILY Patient taking differently: Place 2 sprays into both nostrils  daily as needed for allergies or rhinitis. 07/15/20  Yes Pray, Norwood Levo, MD  furosemide (LASIX) 20 MG tablet Take 1 tablet (20 mg total) by mouth daily. 09/22/20 10/22/20 Yes Simmons-Robinson, Makiera, MD  gabapentin (NEURONTIN) 600 MG tablet Take 2 tablets (1,200 mg total) by mouth 3 (three) times daily. 02/20/18  Yes Alveda Reasons, MD  metoprolol tartrate (LOPRESSOR) 100 MG tablet Take 1 tablet (100 mg total) by mouth 2 (two) times daily. 09/19/20  Yes Welborn, Ryan, DO  Multiple Vitamins-Minerals (CENTRUM SILVER 50+WOMEN PO) Take 1 tablet by mouth daily.   Yes [provider]  omeprazole (PRILOSEC) 20 MG capsule Take 1 capsule (20 mg total) by mouth daily. 09/22/20  Yes Simmons-Robinson, Makiera, MD  potassium chloride (KLOR-CON) 10 MEQ tablet Take 4 tablets (40 mEq total) by mouth daily. 09/22/20  Yes Simmons-Robinson, Makiera, MD  Probiotic Product (PROBIOTIC PO) Take 1 capsule by mouth daily.   Yes [provider]  QUEtiapine Fumarate (SEROQUEL XR) 150 MG 24 hr tablet Take 150 mg by mouth at bedtime. 09/02/20  Yes [provider]    The patient is critically ill with multiple organ systems failure and requires high complexity decision making for assessment and support, frequent evaluation and titration of therapies, application of advanced monitoring technologies and extensive interpretation of multiple databases. Critical Care Time devoted to patient care services described in this note independent of APP/resident time (if applicable)  is 30 minutes.   Sherrilyn Rist MD [facility] Personal pager: [phone] If unanswered, please page CCM On-call: [phone]

## 2020-09-26 NOTE — Progress Notes (Signed)
PT Cancellation Note  Patient Details Name: Karen Casey MRN: [id number] DOB: [birth year]   Cancelled Treatment:    Reason Eval/Treat Not Completed: Other (comment);Patient declined, no reason specified. Pt was non specific about not wanting to get OOB to move a bit, will retry as time and pt allow.   Ramond Dial 09/26/2020, 8:29 AM   Mee Hives, PT MS Acute Rehab Dept. Number: [place] and [place]

## 2020-09-26 NOTE — Consult Note (Signed)
Nephrology Consult   Requesting provider: Andrena Mews Service requesting consult: Family medicine Reason for consult: Hyponatremia   Assessment/Recommendations: Karen Casey is a/an 65 y.o. female with a past medical history atrial fibrillation, depression, anxiety, alcohol use disorder, tobacco use disorder, chronic hypoxic respiratory failure  who present w/ hyponatremia   Severe mildly symptomatic hyponatremia: Recurrent hyponatremia after recent hospitalization for similar diagnosis.  Improved during past hospitalization somewhat on its own but also with holding citalopram.  Urine studies obtained this time would suggest ADH is active.  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 -Given minimal symptoms would not use tolvaptan or hypertonic saline at this time but would want to use hypertonic saline if confusion worsened -Fluid restrict to 1.5L -Start urea 15g BID -CTM Na at least every 6 hours -Will titrate therapy PRN -Hold citalopram -Avoid IVFs for now  Hypomagnesemia: Continue w/ repletion prn  Urinary discomfort: No signs of UTI.  Would not treat  Atrial fibrillation: On metoprolol 100 mg twice daily and Eliquis.  Anxiety/depression: On Xanax, Abilify, Seroquel.  Holding citalopram.  Management per primary  Recommendations conveyed to primary service.    [facility] 09/26/2020 12:45 PM   _____________________________________________________________________________________ CC: Hyponatremia  History of Present Illness: Karen Casey is a/an 65 y.o. female with a past medical history of atrial fibrillation, depression, anxiety, alcohol use disorder, tobacco use disorder, chronic hypoxic respiratory failure who presents with fatigue, nausea, palpitations.  The patient was recently hospitalized and discharged on 09/22/2020.  She  was hospitalized for hyponatremia at that time as well as alcohol withdrawal symptoms including seizure.  She was in the ICU at that time.  She also had hypomagnesemia and hypokalemia.  She had repletion of her electrolytes during that admission with some improvement in her symptoms and electrolytes.  Her citalopram was held during that admission but restarted at discharge.  The patient did not have any urine studies obtained during that hospitalization.  Sodium decreased to as low as 119 on admission and slowly improved to 135.  Sodium did decrease down to 131 at the time of discharge.  Patient presented again yesterday with similar symptoms of worsening fatigue, nausea, palpitations.  Sodium was found to be 119.  IV fluids were attempted without significant improvement.  1 dose of tolvaptan 15mg  was given last night. Na is 121 today. Urine studies demonstrated an osmolality of 564 and urine sodium of 188. Serum osmolality was 248. Her citalopram has been held. Alcohol level was undetectable.   Patient states she feels okay today about the same as yesterday.  States she has been urinating a fair amount.  Her sister at bedside thinks she has a little bit of confusion.  Appetite is somewhat poor.   Medications:  Current Facility-Administered Medications  Medication Dose Route Frequency Provider Last Rate Last Admin  . acetaminophen (TYLENOL) tablet 650 mg  650 mg Oral Q6H PRN Zola Button, MD       Or  . acetaminophen (TYLENOL) suppository 650 mg  650 mg Rectal Q6H PRN Zola Button, MD      . ALPRAZolam Duanne Moron) tablet 2 mg  2 mg Oral BID Zola Button, MD   2 mg at 09/26/20 0917  . apixaban (ELIQUIS) tablet 5 mg  5 mg Oral BID Zola Button, MD   5 mg at 09/26/20 0916  . ARIPiprazole (ABILIFY) tablet  5 mg  5 mg Oral Daily Zola Button, MD   5 mg at 09/26/20 0917  . fluticasone (FLONASE) 50 MCG/ACT nasal spray 2 spray  2 spray Each Nare Daily PRN Zola Button, MD      . magnesium sulfate IVPB 2 g 50 mL   2 g Intravenous Once Simmons-Robinson, Makiera, MD      . metoprolol tartrate (LOPRESSOR) tablet 100 mg  100 mg Oral BID Zola Button, MD   100 mg at 09/26/20 0916  . pantoprazole (PROTONIX) EC tablet 40 mg  40 mg Oral Daily Zola Button, MD   40 mg at 09/26/20 0917  . polyethylene glycol (MIRALAX / GLYCOLAX) packet 17 g  17 g Oral Daily PRN Zola Button, MD      . polyvinyl alcohol (LIQUIFILM TEARS) 1.4 % ophthalmic solution 3 drop  3 drop Both Eyes PRN Zola Button, MD      . QUEtiapine (SEROQUEL XR) 24 hr tablet 150 mg  150 mg Oral QHS Zola Button, MD   150 mg at 09/25/20 2221  . urea (URE-NA) oral packet 15 g  15 g Oral BID Reesa Chew, MD         ALLERGIES Patient has no known allergies.  MEDICAL HISTORY Past Medical History:  Diagnosis Date  . Alcohol abuse   . Allergy   . Anxiety   . Cataract 06/09/2012   Right eye and left eye  . Depression   . GERD (gastroesophageal reflux disease)   . 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 Seizures ([place])    xanax withdrawl- December 2013  . Urinary incontinence 06/09/2012     SOCIAL HISTORY Social History   Socioeconomic History  . Marital status: Single    Spouse name: Not on file  . Number of children: Not on file  . Years of education: Not on file  . Highest education level: Not on file  Occupational History  . Not on file  Tobacco Use  . Smoking status: Current Every Day Smoker    Packs/day: 1.00    Years: 43.00    Pack years: 43.00    Types: Cigarettes  . Smokeless tobacco: Never Used  Vaping Use  . Vaping Use: Never used  Substance and Sexual Activity  . Alcohol use: Yes    Alcohol/week: 3.0 standard drinks    Types: 3 Cans of beer per week    Comment: 2-3 times  . Drug use: Yes    Types: Marijuana, Other-see comments    Comment: Past hx of benzo abuse--xanax  . Sexual activity: Not on file  Other Topics Concern  . Not on file  Social History Narrative  . Not on file   Social  Determinants of Health   Financial Resource Strain: Not on file  Food Insecurity: Not on file  Transportation Needs: Not on file  Physical Activity: Not on file  Stress: Not on file  Social Connections: Not on file  Intimate Partner Violence: Not on file     FAMILY HISTORY Family History  Problem Relation Age of Onset  . Hypertension Mother   . Hyperlipidemia Mother   . Aneurysm Mother        Rupture - Cause of death  . Heart disease Father        MI - cause of death  . Depression Father   . Parkinsonism Father   . Colon cancer Neg Hx   . Esophageal cancer Neg Hx   .  Rectal cancer Neg Hx   . Stomach cancer Neg Hx       Review of Systems: 12 systems reviewed Otherwise as per HPI, all other systems reviewed and negative  Physical Exam: Vitals:   09/26/20 0441 09/26/20 0810  BP: (!) 144/72 130/78  Pulse: 70 88  Resp: 20 (!) 21  Temp: 98.1 F (36.7 C) 98.2 F (36.8 C)  SpO2: 96% 94%   Total I/O In: 162.4 [P.O.:120; IV Piggyback:42.4] Out: -   Intake/Output Summary (Last 24 hours) at 09/26/2020 1245 Last data filed at 09/26/2020 0211 Gross per 24 hour  Intake 162.38 ml  Output --  Net 162.38 ml   General: Chronically ill-appearing, lying in bed, no distress HEENT: anicteric sclera, oropharynx clear without lesions CV: Normal rate, no obvious murmurs Lungs: Clear to auscultation bilaterally, normal work of breathing Abd: soft, non-tender, non-distended Skin: no visible lesions or rashes Psych: alert, engaged, appropriate mood and affect, somewhat slow to respond Musculoskeletal: no obvious deformities Neuro: normal speech, tired but no gross focal deficits   Test Results Reviewed Lab Results  Component Value Date   NA 121 (L) 09/26/2020   K 3.9 09/26/2020   CL 86 (L) 09/26/2020   CO2 24 09/26/2020   BUN <5 (L) 09/26/2020   CREATININE 0.47 09/26/2020   CALCIUM 9.0 09/26/2020   ALBUMIN 3.1 (L) 09/26/2020   PHOS 3.3 09/25/2020     I have reviewed  all relevant outside healthcare records related to the patient's current hospitalization

## 2020-09-26 NOTE — Progress Notes (Signed)
FPTS Interim Progress Note  S: Went to bedside to assess patient along RN page about patient having chest pain.  Patient was working with PT and started having chest pain after she got up and walked around and sat in the chair.  She states the pain is on the left side of her chest.  Pain was 10 out of 10 in severity earlier, now 8 out of 10 in severity.  She feels the pain was similar to her chest pain yesterday.  She has some pain in the right side of her upper back which is unchanged from yesterday.  Also with some dyspnea, unchanged from previous.  She feels the pain is different than her GERD pain.  O: BP 130/78 (BP Location: Left Arm)   Pulse 88   Temp 98.2 F (36.8 C) (Axillary)   Resp (!) 21   Ht 5\' 7"  (1.702 m)   Wt 86.2 kg   SpO2 94%   BMI 29.76 kg/m   Gen: Chronically ill-appearing woman lying in bed, NAD CV: RRR, no murmurs Pulmonary: Clear to auscultation bilaterally, no respiratory distress, breathing comfortably on room air Abdomen: Mild tenderness to palpation in the epigastric region  A/P: Chest pain Given exertional component of chest pain, will assess for ACS.  We will also treat for possible GERD component of pain. - EKG - troponins x2 - GI cockail once  Zola Button, MD 09/26/2020, 12:59 PM PGY-1, [place] [facility] Service pager [phone]

## 2020-09-26 NOTE — Progress Notes (Addendum)
Paged CCM, received call back from Dr. Lucile Shutters. He is reviewing her file now. Patient's sodium still 119 despite initial resuscitation with normal saline.   Given slowed mentation and lack of improvement with normal saline, our team believes this patient is out of the scope of our care.    Karen Essex, MD

## 2020-09-26 NOTE — Progress Notes (Signed)
Na reviewed, only received one dose of urea it seems. Na overcorrecting. Will start D5W at 75cc/hr. Repeat Na at 10pm. If still Na trending up, then will need a dose of DDAVP. RN informed via epic chat.  Gean Quint, MD [facility]

## 2020-09-26 NOTE — Progress Notes (Signed)
Family Medicine Teaching Service Daily Progress Note Intern Pager: [phone]  Patient name: Karen Casey Medical record number: [id number] Date of birth: 10/08/1955 Age: 65 y.o. Gender: female  Primary Care Provider: [name], MD Consultants: PCCM   Code Status: Full Code  Pt Overview and Major Events to Date:  4/14 admitted  Assessment and Plan: Karen Casey is a 65 y.o. 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 PMHx significant for atrial fibrillation on apixaban, depression, anxiety, chronic benzodiazepine use, alcohol use disorder, tobacco use, pulmonary nodules, chronic hypoxemic respiratory failure.  SIADH No improvement with normal saline.  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  Citalopram was recently restarted on 4/11 when she was most recently discharged.  Sodium is slightly worse, 120 this morning from 122.  She received a dose of tolvaptan overnight and IV fluids were discontinued.  She remains neurologically intact at this time.  We will continue to monitor and treat with DDAVP and will get assistance from nephrology.  - s/p tolvaptan 15 mg, plan to continue dosing per nephrology - Sodium q4h, goal no more than 8 mEq/24 hours - consider hypertonic saline if worsening - fluid restriction 1000 mL - holding citalopram, duloxetine - check AM cortisol tomorrow - cardiac monitoring - consult nephrology - PCCM following, appreciate recommendations  Dysuria UA normal. - f/u urine culture  Hypomagnesemia Improved with repletion yesterday to Mg 1.7, goal of magnesium 2.0 so we will give another bolus. - IV Mg 2g - monitor Mg  Paroxysmal A Fib Currently in NSR. - continue metoprolol tartrate 100 mg BID - continue apixaban 5 mg BID  HTN Elevated on admission to 267T to 245Y systolic, now improved to  130s this morning.  Not on any antihypertensives other than metoprolol tartrate for rate control for atrial fibrillation.  We will continue to monitor and consider adding antihypertensive medications if persistently hypertensive.  Depression/anxiety on chronic benzodiazepines Home medications include aripiprazole 5 mg daily, alprazolam 2 mg twice daily, citalopram 40 mg daily, duloxetine 60 mg daily, quetiapine 150 mg nightly.  Currently undergoing prolonged benzodiazepine taper.  - hold citalopram, duloxetine - continue aripriprazole, quetiapine, alprazolam  Alcohol use disorder Patient has a history of alcohol use disorder he was recently hospitalized for alcohol withdrawal seizures.    Elevated CIWA score of 7 yesterday evening and was given her alprazolam dose early.  Subsequent CIWA scores of 1 and 0. - monitor CIWA x 24 hours, no lorazepam  Chronic hypoxemic respiratory failure Breathing comfortably on room air this morning. - hold furosemide  GERD With chronic epigastric pain and nausea. - PPI - consider GI cocktail if worsening symptoms  Chronic pain Patient reporting pain in her right upper back which is chronic.  On tizanidine and gabapentin at home. - APAP prn - hold tizanidine, gabapentin  Allergic rhinitis - continue home Flonase  FEN/GI: regular diet, fluid restriction 1L PPx: apixaban  Disposition: progressive  Subjective:  Patient sodium values slightly worsened overnight.  Lab work came back more consistent with SIADH, IV fluids were discontinued and she was given a dose of tolvaptan.  Case was discussed with nephrology and CCM overnight. This morning, patient reports feeling well in his ready to eat breakfast.  She is hungry as she did not eat dinner yesterday as she got to the floor too late.  She is still having some chronic nausea and epigastric  pain.  Denies headache at this time.  Objective: Temp:  [97.8 F (36.6 C)-98.9 F (37.2 C)] 98.1 F (36.7 C)  (04/15 0441) Pulse Rate:  [69-81] 70 (04/15 0441) Resp:  [14-23] 20 (04/15 0441) BP: (144-179)/(72-103) 144/72 (04/15 0441) SpO2:  [96 %-100 %] 96 % (04/15 0441) Weight:  [86.2 kg] 86.2 kg (04/14 1435) Physical Exam: General: chronically ill-appearing woman, NAD Cardiovascular: RRR, no murmurs Respiratory: CTAB, breathing comfortably on room air Abdomen: soft, tenderness to epigastric, RUQ and LUQ regions, +BS Extremities: WWP, no edema Neuro: A&Ox4  Laboratory: Recent Labs  Lab 09/22/20 0351 09/25/20 1409 09/26/20 0307  WBC 11.7* 10.2 9.7  HGB 13.1 14.4 14.4  HCT 38.8 40.6 40.2  PLT 302 519* 444*   Recent Labs  Lab 09/25/20 1409 09/25/20 2032 09/25/20 2155 09/26/20 0307 09/26/20 0551  NA 122*   < > 119* 119* 120*  K 4.4   < > 4.0 3.8 3.9  CL 87*   < > 83* 85* 86*  CO2 25   < > 29 26 25   BUN <5*   < > <5* <5* <5*  CREATININE 0.53   < > 0.52 0.48 0.42*  CALCIUM 9.0   < > 9.1 8.8* 8.9  PROT 6.9  --   --   --  6.3*  BILITOT 0.4  --   --   --  0.3  ALKPHOS 95  --   --   --  99  ALT 31  --   --   --  28  AST 31  --   --   --  32  GLUCOSE 110*   < > 106* 115* 109*   < > = values in this interval not displayed.     Imaging/Diagnostic Tests: DG Chest 2 View  Result Date: 09/25/2020 CLINICAL DATA:  Chest pain. EXAM: CHEST - 2 VIEW COMPARISON:  September 20, 2020. FINDINGS: Stable cardiomediastinal silhouette. No pneumothorax or pleural effusion is noted. Stable left lingular subsegmental atelectasis or scarring is noted. Right lung is clear. Bony thorax is unremarkable. IMPRESSION: Stable left lingular subsegmental atelectasis or scarring. Electronically Signed   By: Marijo Conception M.D.   On: 09/25/2020 15:23     Zola Button, MD 09/26/2020, 7:42 AM PGY-1, [place] Intern pager: [phone], text pages welcome

## 2020-09-26 NOTE — Evaluation (Signed)
Physical Therapy Evaluation Patient Details Name: Karen Casey MRN: [id number] DOB: 1955-12-03 Today's Date: 09/26/2020   History of Present Illness  Pt is 65 yo admitted 3/29 after found with AMS and covered in feces at home. Pt was taken for head CT during which she had a Sz and required intubation 3/29. Extubated 4/1. PMhx: ETOH abuse, depression/anxiety, GERD, IBS, HTN, urinary incontinence  Clinical Impression  Pt was seen for mobility after initially being too fatigued to move.  Has some chronic back pain but still could make the effort to get up and take a few steps.  Pt was on RW at home and has accessible setup. Follow along with her to increase LE strength, to increase standing tolerance and to work on stability with gait as well as gait quality.  Pt is maintaining sats during session, was down to 93% but not especially SOB to take sidesteps.  Pt is reporting back pain as a mobility hindrance and should be working on ROM to back.  No chest pain reported this session.    Follow Up Recommendations Home health PT;Supervision for mobility/OOB    Equipment Recommendations  None recommended by PT (received a walker last admission)    Recommendations for Other Services       Precautions / Restrictions Precautions Precautions: Fall Precaution Comments: monitor sats Restrictions Weight Bearing Restrictions: No      Mobility  Bed Mobility Overal bed mobility: Needs Assistance Bed Mobility: Supine to Sit;Sit to Supine     Supine to sit: Min guard;Min assist Sit to supine: Min guard        Transfers Overall transfer level: Needs assistance Equipment used: Rolling walker (2 wheeled) Transfers: Sit to/from Stand Sit to Stand: Min assist         General transfer comment: min assist to power up and to control initial standing  Ambulation/Gait Ambulation/Gait assistance: Min guard;Min assist Gait Distance (Feet): 12 Feet (4 x 3) Assistive device: Rolling walker (2  wheeled) Gait Pattern/deviations: Step-to pattern;Wide base of support;Decreased stride length Gait velocity: reduced Gait velocity interpretation: <1.8 ft/sec, indicate of risk for recurrent falls    Stairs            Wheelchair Mobility    Modified Rankin (Stroke Patients Only)       Balance Overall balance assessment: Needs assistance Sitting-balance support: Feet supported Sitting balance-Leahy Scale: Fair     Standing balance support: Bilateral upper extremity supported;During functional activity Standing balance-Leahy Scale: Poor Standing balance comment: more fatigued than when OT saw her                             Pertinent Vitals/Pain Pain Assessment: Faces Faces Pain Scale: Hurts little more Pain Location: low back pain, chronic Pain Descriptors / Indicators: Grimacing Pain Intervention(s): Limited activity within patient's tolerance;Monitored during session;Repositioned    Home Living Family/patient expects to be discharged to:: Private residence Living Arrangements: Spouse/significant other Available Help at Discharge: Family;Available 24 hours/day Type of Home: Apartment Home Access: Level entry     Home Layout: One level Home Equipment: Walker - 2 wheels;Bedside commode;Other (comment)      Prior Function Level of Independence: Independent with assistive device(s)         Comments: was on RW at home but states she did not use O2     Hand Dominance   Dominant Hand: Right    Extremity/Trunk Assessment   Upper Extremity Assessment Upper  Extremity Assessment: Overall WFL for tasks assessed    Lower Extremity Assessment Lower Extremity Assessment: Generalized weakness    Cervical / Trunk Assessment Cervical / Trunk Assessment: Kyphotic (mild)  Communication   Communication: No difficulties  Cognition Arousal/Alertness: Awake/alert Behavior During Therapy: Flat affect Overall Cognitive Status: Impaired/Different from  baseline Area of Impairment: Problem solving;Awareness;Safety/judgement;Following commands;Attention                   Current Attention Level: Selective Memory: Decreased short-term memory Following Commands: Follows one step commands inconsistently;Follows one step commands with increased time Safety/Judgement: Decreased awareness of safety;Decreased awareness of deficits Awareness: Intellectual Problem Solving: Slow processing;Requires verbal cues;Requires tactile cues General Comments: willing to walk away from the bed when her legs are shaky, unaware of limits      General Comments General comments (skin integrity, edema, etc.): room air, sats 93%    Exercises     Assessment/Plan    PT Assessment Patient needs continued PT services  PT Problem List Decreased strength;Decreased activity tolerance;Decreased balance;Decreased mobility;Decreased safety awareness;Pain       PT Treatment Interventions Gait training;Functional mobility training;Therapeutic activities;Patient/family education;Stair training;DME instruction;Therapeutic exercise;Balance training;Neuromuscular re-education    PT Goals (Current goals can be found in the Care Plan section)  Acute Rehab PT Goals Patient Stated Goal: to rest after therapy PT Goal Formulation: With patient Time For Goal Achievement: 10/10/20 Potential to Achieve Goals: Good    Frequency Min 3X/week   Barriers to discharge   home with help    Co-evaluation               AM-PAC PT "6 Clicks" Mobility  Outcome Measure Help needed turning from your back to your side while in a flat bed without using bedrails?: A Little Help needed moving from lying on your back to sitting on the side of a flat bed without using bedrails?: A Little Help needed moving to and from a bed to a chair (including a wheelchair)?: A Little Help needed standing up from a chair using your arms (e.g., wheelchair or bedside chair)?: A Little Help  needed to walk in hospital room?: A Little Help needed climbing 3-5 steps with a railing? : A Lot 6 Click Score: 17    End of Session Equipment Utilized During Treatment: Gait belt;Oxygen Activity Tolerance: Patient tolerated treatment well;Patient limited by fatigue Patient left: in bed;with call bell/phone within reach;with bed alarm set Nurse Communication: Mobility status PT Visit Diagnosis: Unsteadiness on feet (R26.81);Muscle weakness (generalized) (M62.81);Difficulty in walking, not elsewhere classified (R26.2)    Time: 7829-5621 PT Time Calculation (min) (ACUTE ONLY): 20 min   Charges:   PT Evaluation $PT Eval Moderate Complexity: 1 Mod         Ramond Dial 09/26/2020, 5:52 PM Mee Hives, PT [place] Acute Rehab Dept. Number: [address] [place]

## 2020-09-27 DIAGNOSIS — E871 Hypo-osmolality and hyponatremia: Secondary | ICD-10-CM

## 2020-09-27 DIAGNOSIS — R0789 Other chest pain: Secondary | ICD-10-CM

## 2020-09-27 LAB — BASIC METABOLIC PANEL
Anion gap: 10 (ref 5–15)
BUN: 12 mg/dL (ref 8–23)
CO2: 28 mmol/L (ref 22–32)
Calcium: 9.5 mg/dL (ref 8.9–10.3)
Chloride: 91 mmol/L — ABNORMAL LOW (ref 98–111)
Creatinine, Ser: 0.64 mg/dL (ref 0.44–1.00)
GFR, Estimated: >60 mL/min (ref >60)
Glucose, Bld: 123 mg/dL — ABNORMAL HIGH (ref 70–99)
Potassium: 3.9 mmol/L (ref 3.5–5.1)
Sodium: 129 mmol/L — ABNORMAL LOW (ref 135–145)

## 2020-09-27 LAB — CBC
HCT: 40.9 % (ref 36.0–46.0)
Hemoglobin: 14 g/dL (ref 12.0–15.0)
MCH: 32 pg (ref 26.0–34.0)
MCHC: 34.2 g/dL (ref 30.0–36.0)
MCV: 93.6 fL (ref 80.0–100.0)
Platelets: 452 10*3/uL — ABNORMAL HIGH (ref 150–400)
RBC: 4.37 MIL/uL (ref 3.87–5.11)
RDW: 11.9 % (ref 11.5–15.5)
WBC: 7.5 10*3/uL (ref 4.0–10.5)
nRBC: 0 % (ref 0.0–0.2)

## 2020-09-27 LAB — MAGNESIUM: Magnesium: 2.3 mg/dL (ref 1.7–2.4)

## 2020-09-27 LAB — CORTISOL-AM, BLOOD: Cortisol - AM: 10.3 ug/dL (ref 6.7–22.6)

## 2020-09-27 LAB — SODIUM
Sodium: 129 mmol/L — ABNORMAL LOW (ref 135–145)
Sodium: 129 mmol/L — ABNORMAL LOW (ref 135–145)

## 2020-09-27 MED ORDER — ALUM & MAG HYDROXIDE-SIMETH 200-200-20 MG/5ML PO SUSP
30.0000 mL | ORAL | Status: DC | PRN
  Administered 2020-09-27: 30 mL via ORAL
  Filled 2020-09-27: qty 30

## 2020-09-27 MED ORDER — NICOTINE 7 MG/24HR TD PT24
7.0000 mg | MEDICATED_PATCH | Freq: Every day | TRANSDERMAL | Status: DC
  Administered 2020-09-27 – 2020-09-28 (×2): 7 mg via TRANSDERMAL
  Filled 2020-09-27: qty 1

## 2020-09-27 MED ORDER — UREA 15 G PO PACK: 15.0000 g | PACK | Freq: Two times a day (BID) | ORAL | Status: DC

## 2020-09-27 MED ORDER — UREA 15 G PO PACK
15.0000 g | PACK | Freq: Every day | ORAL | Status: DC
  Administered 2020-09-27 – 2020-09-28 (×2): 15 g via ORAL
  Filled 2020-09-27 (×2): qty 1

## 2020-09-27 NOTE — Progress Notes (Signed)
Per Luetta Nutting, RN MD states to leave PIV out at this time as she has removed it the past two days and currently has no IV meds.

## 2020-09-27 NOTE — Progress Notes (Signed)
Nephrology Follow-Up Consult note   Assessment/Recommendations: Karen Casey is a/an 65 y.o. female with a past medical history significant for atrial fibrillation, depression, anxiety, alcohol use disorder, tobacco use disorder, chronic hypoxic respiratory failure  who present w/ hyponatremia   Severe mildly symptomatic hyponatremia: Recurrent hyponatremia after recent hospitalization for similar diagnosis.  Improved during past hospitalization somewhat on its own but also with holding citalopram.  Urine studies and clinical scenario consistent with SIADH -Sodium greatly improved to 129 today; required D5 water overnight to slow down correction -Stop D5 water now -Allow for self correction -Fluid restrict to 1.5L -Sodium can be monitored twice daily -Will titrate therapy PRN -Hold citalopram -Avoid IVFs for now  Hypomagnesemia: Continue w/ repletion prn  Urinary discomfort: No signs of UTI.  Would not treat  Atrial fibrillation: On metoprolol 100 mg twice daily and Eliquis.  Anxiety/depression: On Xanax, Abilify, Seroquel.  Holding citalopram.  Management per primary   Recommendations conveyed to primary service.    [facility] 09/27/2020 9:12 AM  ___________________________________________________________  CC: Hyponatremia  Interval History/Subjective: Patient states she feels much better today.  Less fatigue and nausea.  Denies any significant confusion   Medications:  Current Facility-Administered Medications  Medication Dose Route Frequency Provider Last Rate Last Admin  . acetaminophen (TYLENOL) tablet 650 mg  650 mg Oral Q6H PRN Zola Button, MD       Or  . acetaminophen (TYLENOL) suppository 650 mg  650 mg Rectal Q6H PRN Zola Button, MD      . ALPRAZolam Duanne Moron) tablet 2 mg  2 mg Oral BID Zola Button, MD   2 mg at 09/26/20 2127  . apixaban (ELIQUIS) tablet 5 mg  5 mg Oral BID Zola Button, MD   5 mg at 09/26/20 2126  .  ARIPiprazole (ABILIFY) tablet 5 mg  5 mg Oral Daily Zola Button, MD   5 mg at 09/26/20 0917  . fluticasone (FLONASE) 50 MCG/ACT nasal spray 2 spray  2 spray Each Nare Daily PRN Zola Button, MD      . metoprolol tartrate (LOPRESSOR) tablet 100 mg  100 mg Oral BID Zola Button, MD   100 mg at 09/26/20 2126  . pantoprazole (PROTONIX) EC tablet 40 mg  40 mg Oral Daily Zola Button, MD   40 mg at 09/26/20 0917  . polyethylene glycol (MIRALAX / GLYCOLAX) packet 17 g  17 g Oral Daily PRN Zola Button, MD   17 g at 09/26/20 2238  . polyvinyl alcohol (LIQUIFILM TEARS) 1.4 % ophthalmic solution 3 drop  3 drop Both Eyes PRN Zola Button, MD      . QUEtiapine (SEROQUEL XR) 24 hr tablet 150 mg  150 mg Oral QHS Zola Button, MD   150 mg at 09/26/20 2127      Review of Systems: 10 systems reviewed and negative except per interval history/subjective  Physical Exam: Vitals:   09/27/20 0006 09/27/20 0349  BP: 108/73 117/73  Pulse: 70 78  Resp: (!) 21 20  Temp: 98.1 F (36.7 C) 98 F (36.7 C)  SpO2: 93% 93%   No intake/output data recorded.  Intake/Output Summary (Last 24 hours) at 09/27/2020 0912 Last data filed at 09/27/2020 0630 Gross per 24 hour  Intake 1192.53 ml  Output 2050 ml  Net -857.47 ml   Constitutional: well-appearing, no acute distress ENMT: ears and nose without scars or lesions, MMM CV: normal rate, no edema Respiratory: Bilateral chest rise, normal work of breathing Gastrointestinal: soft,  non-tender, no palpable masses or hernias Skin: no visible lesions or rashes Psych: alert, judgement/insight appropriate, appropriate mood and affect   Test Results I personally reviewed new and old clinical labs and radiology tests Lab Results  Component Value Date   NA 129 (L) 09/27/2020   K 3.9 09/27/2020   CL 91 (L) 09/27/2020   CO2 28 09/27/2020   BUN 12 09/27/2020   CREATININE 0.64 09/27/2020   CALCIUM 9.5 09/27/2020   ALBUMIN 3.1 (L) 09/26/2020   PHOS 3.3 09/25/2020

## 2020-09-27 NOTE — Hospital Course (Addendum)
Karen Casey is a 65 y.o. 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 Hospital course outlined by problem below:  SIADH  On admission, patient was found to be hyponatremic with a sodium of 122 and hypoosmolar with with an osmolality of 248.  TSH and a.m. cortisol wnl (though cortisol not collected at the right time). She had mild symptoms of fatigue, but was alert and oriented and neurologically intact.  Citalopram and duloxetine were held on admission.  She was tried on normal saline at maintenance rate due to clinical signs of dehydration but without improvement in sodium.  Urine studies revealed an inappropriately normal urine osmolality and an inappropriately normal urine sodium (188).  Sodium levels nadired at 119 but she remained at her mental status baseline.  IV fluids were then discontinued and she received a dose of tolvaptan 15 mg with fluid restriction.  She had mild improvement with the tolvaptan and was started on urea, for which she received 1 dose.  There was some concern for overcorrection, so she required D5 free water briefly. On day of discharge, she had a sodium level of 133. Duloxetine was discontinued on discharge. Patient was started on citalopram taper prior to discharge at 30mg  (from 40mg  according to prior prescription) and recommended for outpatient follow up to continue decreasing dose by 10mg  weekly.   Chest pain Patient initially presented to the ED for concern of chest pain and palpitations.  She received a dose of 324 mg ASA and sublingual nitroglycerin via EMS prior to arrival.  Vital signs were stable other than mild hypertension.  ACS work-up was negative with normal and flat troponins and EKG in NSR and unchanged from prior studies.  During admission, she had  another episode of chest pain.  Troponins were trended again at that time which were normal and flat.  She was treated with a GI cocktail with improvement.  Atrial fibrillation Patient remained in normal sinus rhythm throughout admission and was continued on her home beta-blocker and anticoagulation.  Dysuria On admission, patient symptoms of dysuria and urinary frequency.  UA was collected without signs of infection, so patient was not treated for UTI.  Alcohol use disorder Alcohol level was undetectable in the ED.  Patient was monitored on CIWA for 24 hours without concern for acute alcohol withdrawal.  Other problems chronic and stable.  Follow-up recommendations: Duloxetine discontinued on discharge. Could consider trying a different SSRI such as sertraline. Patient started on citalopram taper at 30mg  for one week. Please follow up with patient weekly to continue decreasing dose by 10mg  and stopping Citalopram completely after one week of 10mg .  Ensure psychiatry follow-up to discuss medication changes.  Continue to wean alprazolam slowly. Please confirm that patient is taking Ure-Na 15 mg daily.  Patient was prescribed 7-day course upon the time of discharge.  She should complete on 10/04/2020. Please monitor patient sodium on 10/01/2020. Please confirm that patient has been scheduled for nephrology follow-up as outpatient.

## 2020-09-27 NOTE — Progress Notes (Addendum)
Family Medicine Teaching Service Daily Progress Note Intern Pager: [phone]  Patient name: Karen Casey Medical record number: [id number] Date of birth: 1956/01/04 Age: 65 y.o. Gender: female  Primary Care Provider: [name], MD Consultants: PCCM   Code Status: Full Code  Pt Overview and Major Events to Date:  4/14 admitted  Assessment and Plan: Karen Casey is a 65 y.o. 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 PMHx significant for atrial fibrillation on apixaban, depression, anxiety, chronic benzodiazepine use, alcohol use disorder, tobacco use, pulmonary nodules, chronic hypoxemic respiratory failure.  SIADH No improvement with normal saline.  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  Citalopram was recently restarted on 4/11 when she was most recently discharged.  She received a dose of diarrhea yesterday and sodium started to improve.  However, she required D5 free water overnight to slow down correction.  Per nephrology, can stop D5 water and urea and allow for self correction.  A.m. cortisol within normal limits, though was not collected at the right time. - Sodium q12h - fluid restriction 1500 mL - holding citalopram, duloxetine - nephrology following, appreciate recommendations  Chest pain Patient had a recurrence of chest pain yesterday after working with physical therapy.  Troponins were trended again and were normal and flat.  Improved with GI cocktail. - GI cocktail if chest pain worsening  Dysuria UA normal.  Will not treat at this time. - f/u urine culture  Hypomagnesemia Goal magnesium greater than 2. - f/u Mg  Paroxysmal A Fib Currently in NSR. - continue metoprolol tartrate 100 mg BID - continue apixaban 5 mg BID  HTN Now normotensive, not on any antihypertensives other than metoprolol for  rate control for atrial fibrillation.  Depression/anxiety on chronic benzodiazepines Home medications include aripiprazole 5 mg daily, alprazolam 2 mg twice daily, citalopram 40 mg daily, duloxetine 60 mg daily, quetiapine 150 mg nightly.  Currently undergoing prolonged benzodiazepine taper.  - hold citalopram, duloxetine - continue aripriprazole, quetiapine, alprazolam  Alcohol use disorder Patient has a history of alcohol use disorder he was recently hospitalized for alcohol withdrawal seizures.    No concern for alcohol withdrawal at this time.  Chronic hypoxemic respiratory failure Breathing comfortably on room air this morning. - hold furosemide  GERD With chronic epigastric pain and nausea. - PPI - consider GI cocktail if worsening symptoms  Chronic pain Patient reporting pain in her right upper back which is chronic.  On tizanidine and gabapentin at home. - APAP prn - hold tizanidine, gabapentin  Allergic rhinitis - continue home Flonase  FEN/GI: regular diet, fluid restriction 1.5L PPx: apixaban  Disposition: progressive, d/c possibly today or tomorrow pending sodium goals  Subjective:  Overnight, patient required D5 water due to concern for overcorrection of sodium. This morning, patient feels overall better.  She is asking when she will be able to go home.  Denies headache.  Objective: Temp:  [98 F (36.7 C)-98.2 F (36.8 C)] 98 F (36.7 C) (04/16 0349) Pulse Rate:  [70-88] 78 (04/16 0349) Resp:  [17-21] 20 (04/16 0349) BP: (92-130)/(63-78) 117/73 (04/16 0349) SpO2:  [93 %-96 %] 93 % (04/16 0349) Weight:  [88.7 kg] 88.7 kg (04/16 0500) Physical Exam: General: chronically ill-appearing woman, NAD Cardiovascular: RRR, no murmurs Respiratory: CTAB, breathing comfortably on room air Abdomen: soft, mild tenderness to epigastric region, +BS Extremities: WWP, no edema Neuro: A&Ox4  Laboratory: Recent  Labs  Lab 09/25/20 1409 09/26/20 0307 09/27/20 0603   WBC 10.2 9.7 7.5  HGB 14.4 14.4 14.0  HCT 40.6 40.2 40.9  PLT 519* 444* 452*   Recent Labs  Lab 09/25/20 1409 09/25/20 2032 09/26/20 0551 09/26/20 1016 09/26/20 1453 09/26/20 2130 09/27/20 0342 09/27/20 0603  NA 122*   < > 120* 121*   < > 129* 129* 129*  K 4.4   < > 3.9 3.9  --   --   --  3.9  CL 87*   < > 86* 86*  --   --   --  91*  CO2 25   < > 25 24  --   --   --  28  BUN <5*   < > <5* <5*  --   --   --  12  CREATININE 0.53   < > 0.42* 0.47  --   --   --  0.64  CALCIUM 9.0   < > 8.9 9.0  --   --   --  9.5  PROT 6.9  --  6.3*  --   --   --   --   --   BILITOT 0.4  --  0.3  --   --   --   --   --   ALKPHOS 95  --  99  --   --   --   --   --   ALT 31  --  28  --   --   --   --   --   AST 31  --  32  --   --   --   --   --   GLUCOSE 110*   < > 109* 118*  --   --   --  123*   < > = values in this interval not displayed.     Imaging/Diagnostic Tests: No new imaging.   Zola Button, MD 09/27/2020, 7:50 AM PGY-1, [place] Intern pager: [phone], text pages welcome

## 2020-09-27 NOTE — Progress Notes (Addendum)
NAME:  BRIANAH HOPSON, MRN:  [id number], DOB:  September 27, 1955, LOS: 1 ADMISSION DATE:  09/25/2020, CONSULTATION DATE:  09/26/2020 REFERRING MD:  Dr Jeani Hawking, CHIEF COMPLAINT:  Hyponatremia   History of Present Illness:  Consult was called for hyponatremia, SIADH, AMS  Patient with a history of seizure disorder, GERD, hypertension, EtOH abuse Came into the hospital with feeling unwell with fluttering heart/palpitations, some chest discomfort  Admitted 4/14 with sodium of 122, current sodium is 119  Concern for altered mentation Patient was appropriate for me, was sleepy, earlier received Ativan and seroquel Stated she feels better, heart not fluttering like before.  Currently being treated for possible SIADH -High urine osmolality, urine sodium -Was given lactated Ringer's, given normal saline  -Reason SIADH may be Cymbalta, beer potomaniar  She was recently hospitalized and treated for alcohol withdrawal seizures, required  Pertinent  Medical History   Past Medical History:  Diagnosis Date  . Alcohol abuse   . Allergy   . Anxiety   . Cataract 06/09/2012   Right eye and left eye  . Depression   . GERD (gastroesophageal reflux disease)   . 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 Seizures ([place])    xanax withdrawl- December 2013  . Urinary incontinence 06/09/2012     Significant Hospital Events: Including procedures, antibiotic start and stop dates in addition to other pertinent events   .   Interim History / Subjective:  Comfortable no acute distress pulmonary critical care will sign off at this time  Objective   Blood pressure 117/73, pulse 78, temperature 98 F (36.7 C), temperature source Axillary, resp. rate 20, height 5\' 7"  (1.702 m), weight 88.7 kg, SpO2 93 %.        Intake/Output Summary (Last 24 hours) at 09/27/2020 0840 Last data filed at 09/27/2020 4259 Gross per 24 hour  Intake 1192.53 ml  Output 2050 ml  Net -857.47 ml   Filed  Weights   09/25/20 1435 09/27/20 0500  Weight: 86.2 kg 88.7 kg    Examination: General: Well-nourished well-developed female HEENT: No JVD or lymphadenopathy is appreciated Neuro: Alert and orientated follows commands CV: Heart sounds are regular PULM: Diminished in the bases GI: soft, bsx4 active   Extremities: warm/dry, 1+ edema  Skin: no rashes or lesions  Labs/imaging that I havepersonally reviewed  (right click and "Reselect all SmartList Selections" daily)  Reviewed labs-chemistry, urine electrolytes, chest x-ray cmet Resolved Hospital Problem list     Assessment & Plan:  Hyponatremia Recent Labs  Lab 09/26/20 2130 09/27/20 0342 09/27/20 0603  NA 129* 129* 129*  Questionable related to SIADH  Sodium remained stable at 129 Neurologically intact Pulmonary critical care available if needed Renal is following All other issues per primary      Best practice (right click and "Reselect all SmartList Selections" daily)  Diet:  Oral Pain/Anxiety/Delirium protocol (if indicated): No VAP protocol (if indicated): Not indicated DVT prophylaxis: Systemic AC GI prophylaxis: PPI Glucose control:  SSI No Central venous access:  N/A Arterial line:  N/A Foley:  N/A Mobility:  OOB  PT consulted: N/A Last date of multidisciplinary goals of care discussion [per primary] Code Status:  full code Disposition: med surg  Labs   CBC: Recent Labs  Lab 09/21/20 0306 09/22/20 0351 09/25/20 1409 09/26/20 0307 09/27/20 0603  WBC 10.7* 11.7* 10.2 9.7 7.5  NEUTROABS  --   --  7.1  --   --   HGB 12.8 13.1 14.4  14.4 14.0  HCT 38.6 38.8 40.6 40.2 40.9  MCV 99.7 97.5 91.9 90.3 93.6  PLT 266 302 519* 444* 452*    Basic Metabolic Panel: Recent Labs  Lab 09/21/20 0306 09/25/20 1409 09/25/20 2032 09/25/20 2155 09/26/20 0307 09/26/20 0551 09/26/20 1016 09/26/20 1453 09/26/20 1758 09/26/20 2130 09/27/20 0342 09/27/20 0603  NA 131* 122*   < > 119* 119* 120* 121*  124* 129* 129* 129* 129*  K 3.7 4.4   < > 4.0 3.8 3.9 3.9  --   --   --   --  3.9  CL 87* 87*   < > 83* 85* 86* 86*  --   --   --   --  91*  CO2 38* 25   < > 29 26 25 24   --   --   --   --  28  GLUCOSE 116* 110*   < > 106* 115* 109* 118*  --   --   --   --  123*  BUN 6* <5*   < > <5* <5* <5* <5*  --   --   --   --  12  CREATININE 0.58 0.53   < > 0.52 0.48 0.42* 0.47  --   --   --   --  0.64  CALCIUM 9.3 9.0   < > 9.1 8.8* 8.9 9.0  --   --   --   --  9.5  MG 1.8 1.3*  --   --   --  1.7  --   --   --   --   --   --   PHOS  --  3.3  --   --   --   --   --   --   --   --   --   --    < > = values in this interval not displayed.   GFR: Estimated Creatinine Clearance: 83.3 mL/min (by C-G formula based on SCr of 0.64 mg/dL). Recent Labs  Lab 09/22/20 0351 09/25/20 1409 09/25/20 1425 09/26/20 0307 09/27/20 0603  WBC 11.7* 10.2  --  9.7 7.5  LATICACIDVEN  --   --  0.9  --   --     Liver Function Tests: Recent Labs  Lab 09/25/20 1409 09/26/20 0551  AST 31 32  ALT 31 28  ALKPHOS 95 99  BILITOT 0.4 0.3  PROT 6.9 6.3*  ALBUMIN 3.3* 3.1*   Recent Labs  Lab 09/25/20 1409  LIPASE 39   No results for input(s): AMMONIA in the last 168 hours.  ABG    Component Value Date/Time   PHART 7.344 (L) 09/12/2020 1855   PCO2ART 48.0 09/12/2020 1855   PO2ART 82 (L) 09/12/2020 1855   HCO3 26.2 09/12/2020 1855   TCO2 28 09/12/2020 1855   O2SAT 95.0 09/12/2020 1855     Coagulation Profile: Recent Labs  Lab 09/25/20 1429  INR 1.3*    Cardiac Enzymes: No results for input(s): CKTOTAL, CKMB, CKMBINDEX, TROPONINI in the last 168 hours.  HbA1C: Hgb A1c MFr Bld  Date/Time Value Ref Range Status  09/17/2020 02:08 AM 5.2 4.8 - 5.6 % Final    Comment:    (NOTE) Pre diabetes:          5.7%-6.4%  Diabetes:              >6.4%  Glycemic control for   <7.0% adults with diabetes   06/20/2020 02:31 PM 5.3 4.8 -  5.6 % Final    Comment:             Prediabetes: 5.7 - 6.4           Diabetes: >6.4          Glycemic control for adults with diabetes: <7.0     CBG: No results for input(s): GLUCAP in the last 168 hours.  Richardson Landry Americus Perkey ACNP Acute Care Nurse Practitioner [place] Please consult Amion 09/27/2020, 8:41 AM

## 2020-09-28 LAB — BASIC METABOLIC PANEL
Anion gap: 10 (ref 5–15)
BUN: 18 mg/dL (ref 8–23)
CO2: 29 mmol/L (ref 22–32)
Calcium: 9.9 mg/dL (ref 8.9–10.3)
Chloride: 94 mmol/L — ABNORMAL LOW (ref 98–111)
Creatinine, Ser: 0.66 mg/dL (ref 0.44–1.00)
GFR, Estimated: >60 mL/min (ref >60)
Glucose, Bld: 112 mg/dL — ABNORMAL HIGH (ref 70–99)
Potassium: 4.2 mmol/L (ref 3.5–5.1)
Sodium: 133 mmol/L — ABNORMAL LOW (ref 135–145)

## 2020-09-28 MED ORDER — CITALOPRAM HYDROBROMIDE 10 MG PO TABS: 30.0000 mg | ORAL_TABLET | Freq: Every day | ORAL | 0 refills | Status: DC

## 2020-09-28 MED ORDER — UREA 15 G PO PACK: 15.0000 g | PACK | Freq: Every day | ORAL | 0 refills | Status: DC

## 2020-09-28 NOTE — Progress Notes (Signed)
Nsg Discharge Note  Admit Date:  09/25/2020 Discharge date: 09/28/2020   Karen Casey to be D/C'd .Patient/caregiver able to verbalize understanding.  Discharge Medication: Allergies as of 09/28/2020   No Known Allergies     Medication List    STOP taking these medications   DULoxetine 60 MG capsule Commonly known as: CYMBALTA   furosemide 20 MG tablet Commonly known as: Lasix   potassium chloride 10 MEQ tablet Commonly known as: KLOR-CON     TAKE these medications   ALPRAZolam 1 MG tablet Commonly known as: XANAX Take 2 tablets (2 mg total) by mouth 2 (two) times daily.   ARIPiprazole 5 MG tablet Commonly known as: ABILIFY Take 5 mg by mouth daily.   CENTRUM SILVER 50+WOMEN PO Take 1 tablet by mouth daily.   citalopram 10 MG tablet Commonly known as: CELEXA Take 3 tablets (30 mg total) by mouth daily. What changed:   medication strength  how much to take   Eliquis 5 MG Tabs tablet Generic drug: apixaban Take 1 tablet (5 mg total) by mouth 2 (two) times daily.   fluticasone 50 MCG/ACT nasal spray Commonly known as: FLONASE SHAKE LIQUID AND USE 2 SPRAYS IN EACH NOSTRIL DAILY What changed:   how much to take  how to take this  when to take this  reasons to take this  additional instructions   gabapentin 600 MG tablet Commonly known as: NEURONTIN Take 2 tablets (1,200 mg total) by mouth 3 (three) times daily.   metoprolol tartrate 100 MG tablet Commonly known as: LOPRESSOR Take 1 tablet (100 mg total) by mouth 2 (two) times daily.   omeprazole 20 MG capsule Commonly known as: PRILOSEC Take 1 capsule (20 mg total) by mouth daily.   PROBIOTIC PO Take 1 capsule by mouth daily.   QUEtiapine Fumarate 150 MG 24 hr tablet Commonly known as: SEROQUEL XR Take 150 mg by mouth at bedtime.   urea 15 g Pack oral packet Commonly known as: URE-NA Take 15 g by mouth daily. Start taking on: September 29, 2020       Discharge Assessment: Vitals:    09/28/20 0800 09/28/20 1232  BP: (!) 120/93 137/79  Pulse: 89 81  Resp: 18 20  Temp:  98 F (36.7 C)  SpO2: 94% 97%   Skin clean, dry and intact without evidence of skin break down, no evidence of skin tears noted. IV catheter discontinued intact. Site without signs and symptoms of complications - no redness or edema noted at insertion site, patient denies c/o pain - only slight tenderness at site.  Dressing with slight pressure applied.  D/c Instructions-Education: Discharge instructions given to patient/family with verbalized understanding. D/c education completed with patient/family including follow up instructions, medication list, d/c activities limitations if indicated, with other d/c instructions as indicated by MD - patient able to verbalize understanding, all questions fully answered. Patient instructed to return to ED, call 911, or call MD for any changes in condition.  Patient escorted via [place], and D/C home via private auto.  Garfield Coiner, Jolene Schimke, RN 09/28/2020 2:08 PM

## 2020-09-28 NOTE — Progress Notes (Addendum)
Nephrology Follow-Up Consult note   Assessment/Recommendations: Karen Casey is a/an 65 y.o. female with a past medical history significant for atrial fibrillation, depression, anxiety, alcohol use disorder, tobacco use disorder, chronic hypoxic respiratory failure  who present w/ hyponatremia   Severe mildly symptomatic hyponatremia: Recurrent hyponatremia after recent hospitalization for similar diagnosis.  Improved during past hospitalization somewhat on its own but also with holding citalopram.  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 Given persistent borderline low Na and requirement of urea last night would DC with 7 days of urea 15g daily and continue mild fluid restriction. Recommend labs next week (ideally Wednesday) and I will arrange follow up in 7-10 days in my office. Labs from Wednesday can be forwarded to me via epic -Continue fluid restriction at least <2L  -Will titrate therapy PRN -Hold citalopram -Avoid IVFs for now  Hypomagnesemia: Resolved with repletion   Atrial fibrillation: On metoprolol 100 mg twice daily and Eliquis.  Anxiety/depression: On Xanax, Abilify, Seroquel.  Holding citalopram.  Management per primary  We will sign off at this time and plan for outpatient f/u as above. Thank you   Recommendations conveyed to primary service.    [facility] 09/28/2020 9:32 AM  ___________________________________________________________  CC: Hyponatremia  Interval History/Subjective: Patient states she feels okay today.  A little tired but no other complaints.  Sodium was 129 last night so urea 15 g was ordered.  Sodium improved to 133 today   Medications:  Current Facility-Administered Medications  Medication Dose Route Frequency Provider Last Rate Last Admin  . acetaminophen  (TYLENOL) tablet 650 mg  650 mg Oral Q6H PRN Zola Button, MD   650 mg at 09/28/20 0126   Or  . acetaminophen (TYLENOL) suppository 650 mg  650 mg Rectal Q6H PRN Zola Button, MD      . ALPRAZolam Duanne Moron) tablet 2 mg  2 mg Oral BID Zola Button, MD   2 mg at 09/28/20 8182  . alum & mag hydroxide-simeth (MAALOX/MYLANTA) 200-200-20 MG/5ML suspension 30 mL  30 mL Oral Q4H PRN Zola Button, MD   30 mL at 09/27/20 2138  . apixaban (ELIQUIS) tablet 5 mg  5 mg Oral BID Zola Button, MD   5 mg at 09/28/20 9937  . ARIPiprazole (ABILIFY) tablet 5 mg  5 mg Oral Daily Zola Button, MD   5 mg at 09/28/20 0829  . fluticasone (FLONASE) 50 MCG/ACT nasal spray 2 spray  2 spray Each Nare Daily PRN Zola Button, MD      . metoprolol tartrate (LOPRESSOR) tablet 100 mg  100 mg Oral BID Zola Button, MD   100 mg at 09/28/20 0829  . nicotine (NICODERM CQ - dosed in mg/24 hr) patch 7 mg  7 mg Transdermal Daily Zola Button, MD   7 mg at 09/28/20 0830  . pantoprazole (PROTONIX) EC tablet 40 mg  40 mg Oral Daily Zola Button, MD   40 mg at 09/28/20 1696  . polyethylene glycol (MIRALAX / GLYCOLAX) packet 17 g  17 g Oral Daily PRN Zola Button, MD   17 g at 09/27/20 1225  . polyvinyl alcohol (LIQUIFILM TEARS) 1.4 % ophthalmic solution 3 drop  3 drop Both Eyes PRN Zola Button, MD      . QUEtiapine (SEROQUEL XR) 24 hr tablet 150 mg  150 mg Oral QHS Zola Button, MD  150 mg at 09/27/20 2137  . urea (URE-NA) oral packet 15 g  15 g Oral Daily Reesa Chew, MD   15 g at 09/28/20 0830      Review of Systems: 10 systems reviewed and negative except per interval history/subjective  Physical Exam: Vitals:   09/27/20 2105 09/28/20 0404  BP: 123/77 114/72  Pulse: 95 76  Resp: 17 14  Temp: 97.8 F (36.6 C) 97.9 F (36.6 C)  SpO2: 94% 92%   No intake/output data recorded.  Intake/Output Summary (Last 24 hours) at 09/28/2020 0932 Last data filed at 09/27/2020 2300 Gross per 24 hour  Intake 307.7 ml  Output 1000 ml   Net -692.3 ml   Constitutional: well-appearing, no acute distress ENMT: ears and nose without scars or lesions, MMM CV: normal rate, no edema Respiratory: Bilateral chest rise, normal work of breathing Gastrointestinal: soft, non-tender, no palpable masses or hernias Skin: no visible lesions or rashes Psych: alert, judgement/insight appropriate, appropriate mood and affect   Test Results I personally reviewed new and old clinical labs and radiology tests Lab Results  Component Value Date   NA 133 (L) 09/28/2020   K 4.2 09/28/2020   CL 94 (L) 09/28/2020   CO2 29 09/28/2020   BUN 18 09/28/2020   CREATININE 0.66 09/28/2020   CALCIUM 9.9 09/28/2020   ALBUMIN 3.1 (L) 09/26/2020   PHOS 3.3 09/25/2020

## 2020-09-28 NOTE — Discharge Instructions (Signed)
Dear Karen Casey,   Thank you so much for allowing [place] to be part of your care!  You were admitted to [facility] for low sodium levels likely due to your medications citalopram and duloxetine.  You were treated with medications to treat your sodium levels.  It is likely that your medication, citalopram contributed to your low sodium levels so we will recommend starting a taper to eventually stop this medication. We have decreased your dose to 30mg  daily for 1 week and will have you check in with your PCP to see how you tolerate the decrease before decreasing to 20mg  daily for another week and then 10 for a week before stopping completely.   Please follow up with Dr. Arby Barrette on 10/01/20 to have your sodium levels checked at the family medicine center.   Please be on the lookout for a call from nephrology as they will call you to schedule an appointment to follow up on your toleration of the medication to help keep your sodium levels in a normal range. This new medication is called Ure-Na and you will continue this 15mg  daily for 7 days.   POST-HOSPITAL & CARE INSTRUCTIONS 1. Stop taking the  duloxetine.  The kidney doctor recommended that you stop these medications for at least 2 to 4 weeks. 2. You will begin the tapering as discussed above for your citalopram. You will take 30mg  daily for 1 week and follow up with your primary doctor to see how you are tolerating the lower dose before decreasing the dose to 20mg  for one week.  3. Please make sure to follow-up with your psychiatrist about any further medication changes and starting any new medications.  Citalopram has the highest risk of causing low sodium level, but there are other similar medications that may be safer. 4. Please let PCP/Specialists know of any changes that were made.  5. Please see medications section of this packet for any medication changes.   DOCTOR'S APPOINTMENT & FOLLOW UP CARE INSTRUCTIONS  Future Appointments   Date Time Provider [place]  10/01/2020  2:45 PM Chrystie Nose [facility]  10/17/2020  9:30 AM Cantwell, Gerline Legacy, PA-C [facility] None  10/17/2020 11:10 AM Pray, Norwood Levo, MD [facility] [place]    RETURN PRECAUTIONS: Please return to the ED if you develop any severe headache, vomiting, or confusion.  Take care and be well!  [facility]  [place], [place] [address] [phone]   Information on my medicine - ELIQUIS (apixaban)  Why was Eliquis prescribed for you? Eliquis was prescribed for you to reduce the risk of a blood clot forming that can cause a stroke if you have a medical condition called atrial fibrillation (a type of irregular heartbeat).  What do You need to know about Eliquis ? Take your Eliquis TWICE DAILY - one tablet in the morning and one tablet in the evening with or without food. If you have difficulty swallowing the tablet whole please discuss with your pharmacist how to take the medication safely.  Take Eliquis exactly as prescribed by your doctor and DO NOT stop taking Eliquis without talking to the doctor who prescribed the medication.  Stopping may increase your risk of developing a stroke.  Refill your prescription before you run out.  After discharge, you should have regular check-up appointments with your healthcare provider that is prescribing your Eliquis.  In the future your dose  may need to be changed if your kidney function or weight changes by a significant amount or as you get older.  What do you do if you miss a dose? If you miss a dose, take it as soon as you remember on the same day and resume taking twice daily.  Do not take more than one dose of ELIQUIS at the same time to make up a missed dose.  Important Safety Information A possible side effect of Eliquis is bleeding. You should call your healthcare provider right away if you experience any of  the following: ? Bleeding from an injury or your nose that does not stop. ? Unusual colored urine (red or dark brown) or unusual colored stools (red or black). ? Unusual bruising for unknown reasons. ? A serious fall or if you hit your head (even if there is no bleeding).  Some medicines may interact with Eliquis and might increase your risk of bleeding or clotting while on Eliquis. To help avoid this, consult your healthcare provider or pharmacist prior to using any new prescription or non-prescription medications, including herbals, vitamins, non-steroidal anti-inflammatory drugs (NSAIDs) and supplements.  This website has more information on Eliquis (apixaban): http://www.eliquis.com/eliquis/home

## 2020-09-28 NOTE — Plan of Care (Signed)
  Problem: Health Behavior/Discharge Planning: Goal: Ability to manage health-related needs will improve 09/28/2020 1317 by Zayn Selley, Godfrey Pick, RN Outcome: Adequate for Discharge 09/28/2020 1317 by Lyn Joens, Godfrey Pick, RN Outcome: Adequate for Discharge

## 2020-09-28 NOTE — Progress Notes (Signed)
Discussed recommendation for citalopram taper vs discontinuation with pharmacist, Sidney Ace, [place]. Patient has been on citalopram since 2014 and so was recommended for taper.  Would start by discharging patient on 30 mg for 1 week and then have patient follow up with PCP. If patient able to tolerate 30mg  for a week, then would decrease to 20mg  for one week and follow up with PCP to check how she was tolerating lower dose followed by another week of 10mg  and then discontinuation of the citalopram.  Pharmacist aware of Xanax 2mg .

## 2020-09-28 NOTE — Discharge Summary (Addendum)
[facility] Discharge Summary  Patient name: Karen Casey Medical record number: [id number] Date of birth: 1956/01/05 Age: 65 y.o. Gender: female Date of Admission: 09/25/2020  Date of Discharge: 09/28/2020 Admitting Physician: Zola Button, MD  Primary Care Provider: [name], MD Consultants: Nephrology  Indication for Hospitalization: Hyponatremia  Discharge Diagnoses/Problem List:  Active Problems:   Hyponatremia   Paroxysmal atrial fibrillation [facility])   Other chest pain    Disposition: Home  Discharge Condition: Improved  Discharge Exam:  General: Female appearing stated age in no acute distress Cardio: Normal S1 and S2, no S3 or S4. Rhythm is regular. No murmurs or rubs.  Bilateral radial pulses palpable Pulm: Clear to auscultation bilaterally, no crackles, wheezing, or diminished breath sounds. Normal respiratory effort, on room air Abdomen: Bowel sounds normal. Abdomen soft and non-tender.  Extremities: No peripheral edema. Warm & well perfused.   Brief Hospital Course:  Karen Casey is a 65 y.o. 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 Hospital course outlined by problem below:  SIADH  On admission, patient was found to be hyponatremic with a sodium of 122 and hypoosmolar with with an osmolality of 248.  TSH and a.m. cortisol wnl (though cortisol not collected at the right time). She had mild symptoms of fatigue, but was alert and oriented and neurologically intact.  Citalopram and duloxetine were held on admission.  She was tried on normal saline at maintenance rate due to clinical signs of dehydration but without improvement in sodium.  Urine studies revealed an inappropriately normal urine osmolality and an  inappropriately normal urine sodium (188).  Sodium levels nadired at 119 but she remained at her mental status baseline.  IV fluids were then discontinued and she received a dose of tolvaptan 15 mg with fluid restriction.  She had mild improvement with the tolvaptan and was started on urea, for which she received 1 dose.  There was some concern for overcorrection, so she required D5 free water briefly. On day of discharge, she had a sodium level of 133. Duloxetine was discontinued on discharge. Patient was started on citalopram taper prior to discharge at 30mg  (from 40mg  according to prior prescription) and recommended for outpatient follow up to continue decreasing dose by 10mg  weekly.   Chest pain Patient initially presented to the ED for concern of chest pain and palpitations.  She received a dose of 324 mg ASA and sublingual nitroglycerin via EMS prior to arrival.  Vital signs were stable other than mild hypertension.  ACS work-up was negative with normal and flat troponins and EKG in NSR and unchanged from prior studies.  During admission, she had another episode of chest pain.  Troponins were trended again at that time which were normal and flat.  She was treated with a GI cocktail with improvement.  Atrial fibrillation Patient remained in normal sinus rhythm throughout admission and was continued on her home beta-blocker and anticoagulation.  Dysuria On admission, patient symptoms of dysuria and urinary frequency.  UA was collected without signs of infection, so patient was not treated for UTI.  Alcohol use disorder Alcohol level was undetectable in the ED.  Patient was monitored on CIWA for 24 hours without concern for acute alcohol withdrawal.  Other problems chronic and stable.  Follow-up recommendations: 1. Duloxetine discontinued on discharge. Could consider trying a different  SSRI such as sertraline. 2. Patient started on citalopram taper at 30mg  for one week. Please follow up with  patient weekly to continue decreasing dose by 10mg  and stopping Citalopram completely after one week of 10mg .  3. Ensure psychiatry follow-up to discuss medication changes.  4. Continue to wean alprazolam slowly. 5. Please confirm that patient is taking Ure-Na 15 mg daily.  Patient was prescribed 7-day course upon the time of discharge.  She should complete on 10/04/2020. 6. Please monitor patient sodium on 10/01/2020. 7. Please confirm that patient has been scheduled for nephrology follow-up as outpatient.     Significant Procedures: None  Significant Labs and Imaging:  Recent Labs  Lab 09/25/20 1409 09/26/20 0307 09/27/20 0603  WBC 10.2 9.7 7.5  HGB 14.4 14.4 14.0  HCT 40.6 40.2 40.9  PLT 519* 444* 452*   Recent Labs  Lab 09/25/20 1409 09/25/20 2032 09/26/20 0307 09/26/20 0551 09/26/20 1016 09/26/20 1453 09/26/20 2130 09/27/20 0342 09/27/20 0603 09/27/20 1810 09/28/20 0617  NA 122*   < > 119* 120* 121*   < > 129* 129* 129* 129* 133*  K 4.4   < > 3.8 3.9 3.9  --   --   --  3.9  --  4.2  CL 87*   < > 85* 86* 86*  --   --   --  91*  --  94*  CO2 25   < > 26 25 24   --   --   --  28  --  29  GLUCOSE 110*   < > 115* 109* 118*  --   --   --  123*  --  112*  BUN <5*   < > <5* <5* <5*  --   --   --  12  --  18  CREATININE 0.53   < > 0.48 0.42* 0.47  --   --   --  0.64  --  0.66  CALCIUM 9.0   < > 8.8* 8.9 9.0  --   --   --  9.5  --  9.9  MG 1.3*  --   --  1.7  --   --   --   --  2.3  --   --   PHOS 3.3  --   --   --   --   --   --   --   --   --   --   ALKPHOS 95  --   --  99  --   --   --   --   --   --   --   AST 31  --   --  32  --   --   --   --   --   --   --   ALT 31  --   --  28  --   --   --   --   --   --   --   ALBUMIN 3.3*  --   --  3.1*  --   --   --   --   --   --   --    < > = values in this interval not displayed.     Results/Tests Pending at Time of Discharge: None  Discharge Medications:  Allergies as of 09/28/2020   No Known Allergies     Medication  List    STOP taking these medications   DULoxetine 60 MG capsule  Commonly known as: CYMBALTA   furosemide 20 MG tablet Commonly known as: Lasix   potassium chloride 10 MEQ tablet Commonly known as: KLOR-CON     TAKE these medications   ALPRAZolam 1 MG tablet Commonly known as: XANAX Take 2 tablets (2 mg total) by mouth 2 (two) times daily.   ARIPiprazole 5 MG tablet Commonly known as: ABILIFY Take 5 mg by mouth daily.   CENTRUM SILVER 50+WOMEN PO Take 1 tablet by mouth daily.   citalopram 10 MG tablet Commonly known as: CELEXA Take 3 tablets (30 mg total) by mouth daily. What changed:   medication strength  how much to take   Eliquis 5 MG Tabs tablet Generic drug: apixaban Take 1 tablet (5 mg total) by mouth 2 (two) times daily.   fluticasone 50 MCG/ACT nasal spray Commonly known as: FLONASE SHAKE LIQUID AND USE 2 SPRAYS IN EACH NOSTRIL DAILY What changed:   how much to take  how to take this  when to take this  reasons to take this  additional instructions   gabapentin 600 MG tablet Commonly known as: NEURONTIN Take 2 tablets (1,200 mg total) by mouth 3 (three) times daily.   metoprolol tartrate 100 MG tablet Commonly known as: LOPRESSOR Take 1 tablet (100 mg total) by mouth 2 (two) times daily.   omeprazole 20 MG capsule Commonly known as: PRILOSEC Take 1 capsule (20 mg total) by mouth daily.   PROBIOTIC PO Take 1 capsule by mouth daily.   QUEtiapine Fumarate 150 MG 24 hr tablet Commonly known as: SEROQUEL XR Take 150 mg by mouth at bedtime.   urea 15 g Pack oral packet Commonly known as: URE-NA Take 15 g by mouth daily. Start taking on: September 29, 2020       Discharge Instructions: Please refer to Patient Instructions section of EMR for full details.  Patient was counseled important signs and symptoms that should prompt return to medical care, changes in medications, dietary instructions, activity restrictions, and follow up  appointments.   Follow-Up Appointments:   Eulis Foster, MD 09/28/2020, 3:36 PM PGY-2, [place]

## 2020-09-30 ENCOUNTER — Telehealth: Payer: Self-pay | Admitting: *Deleted

## 2020-09-30 NOTE — Chronic Care Management (AMB) (Signed)
  Care Management   Note  09/30/2020 Name: Karen Casey MRN: [id number] DOB: [birth year]  Karen Casey is a 65 y.o. year old female who is a primary care patient of Pray, Norwood Levo, MD. I reached out to Karen Casey by phone today in response to a referral sent by Karen Casey's PCP, Dr. Thompson Grayer.     Ms. Wrigley was given information about care management services today including:  1. Care management services include personalized support from designated clinical staff supervised by her physician, including individualized plan of care and coordination with other care providers 2. 24/7 contact phone numbers for assistance for urgent and routine care needs. 3. The patient may stop care management services at any time by phone call to the office staff.  Patient agreed to services and verbal consent obtained.   Follow up plan: Telephone appointment with care management team member scheduled for:10/09/2020  Shelli Portilla  Care Guide, Embedded Care Coordination [place]  Care Management

## 2020-10-01 ENCOUNTER — Inpatient Hospital Stay: Payer: 59 | Admitting: Family Medicine

## 2020-10-01 NOTE — Progress Notes (Deleted)
    SUBJECTIVE:   CHIEF COMPLAINT / HPI:   Ms. Mccrory is a 65 yo who presents for hospital follow up. 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 Hospitalized from 4/14-4/17 with SIADH: found  to be hyponatremic with a sodium of 122 and hypoosmolar with with an osmolality of 248. Patient was started on citalopram taper prior to discharge at 30mg  (from 40mg  according to prior prescription) and recommended for outpatient follow up to continue decreasing dose by 10mg  weekly  Per pharm: " studies have shown a 25% reduction every 2-4 weeks is tolerated by patients. Right now, I believe the plan is to discharge her sometime this weekend with alprazolam 2mg  BID for 2 weeks. After that, you could probably do a total of 3mg  daily for a couple weeks and downtitrate from there (she could do 1mg  qAM and 2mg  qPM).         Follow-up recommendations: 1. Duloxetine discontinued on discharge. Could consider trying a different SSRI such as sertraline. 2. Patient started on citalopram taper at 30mg  for one week. Please follow up with patient weekly to continue decreasing dose by 10mg  and stopping Citalopram completely after one week of 10mg .  3. Ensure psychiatry follow-up to discuss medication changes.  4. Continue to wean alprazolam slowly. 5. Please confirm that patient is taking Ure-Na 15 mg daily.  Patient was prescribed 7-day course upon the time of discharge.  She should complete on 10/04/2020. 6. Please monitor patient sodium on 10/01/2020. 7. Please confirm that patient has been scheduled for nephrology follow-up as outpatient.   PERTINENT  PMH / PSH: *** SIADH, alcohol use disorder, atrial fibrillation, OBJECTIVE:   There were no vitals taken for this visit.  ***  ASSESSMENT/PLAN:   No problem-specific Assessment & Plan notes found for this encounter.   Medication taper  Taper down alprazolam going from 2 mg twice daily to 1 mg in  the morning and 2 mg at night for the next 2 weeks.  [place]   {    This will disappear when note is signed, click to select method of visit    :1}

## 2020-10-09 ENCOUNTER — Encounter: Payer: Self-pay | Admitting: Family Medicine

## 2020-10-09 ENCOUNTER — Ambulatory Visit: Payer: 59

## 2020-10-09 ENCOUNTER — Ambulatory Visit (INDEPENDENT_AMBULATORY_CARE_PROVIDER_SITE_OTHER): Payer: 59 | Admitting: Family Medicine

## 2020-10-09 ENCOUNTER — Other Ambulatory Visit: Payer: Self-pay

## 2020-10-09 VITALS — BP 137/81 | HR 76 | Ht 66.0 in | Wt 187.6 lb

## 2020-10-09 DIAGNOSIS — E871 Hypo-osmolality and hyponatremia: Secondary | ICD-10-CM | POA: Diagnosis not present

## 2020-10-09 NOTE — Progress Notes (Signed)
    SUBJECTIVE:   CHIEF COMPLAINT / HPI:   Ms. Sease is a 65 yo who presents for hospital follow up. 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 Hospitalized from 4/14-4/17 with SIADH: found  to be hyponatremic with a sodium of 122 and hypoosmolar with with an osmolality of 248. Patient was started on citalopram taper prior to discharge at 30mg  and recommended for outpatient follow up to continue decreasing dose by 10mg  weekly  Today in the office patient states she believe she stopped taking citalopram entirely after discharge from the hospital and states she wasn't advised to taper down. She did say she has tapered her Xanax and is only taking 1mg  twice a day right now. States she is not ready to be off of it entirely but feels ok with her current regimen. Today she reports feeling tired and fatigued. Denies nausea, vomiting, syncopal episodes. Does endorse feeling a little better than when she is in the hospital but does not feel at baseline. States she does not feel like she needs to be in the hospital and does not want to go back. Requesting to finish clinic appointment quickly but did agree to get labs drawn prior to leaving.    PERTINENT  PMH / PSH:  SIADH, alcohol use disorder, atrial fibrillation  OBJECTIVE:   BP 137/81   Pulse 76   Ht 5\' 6"  (1.676 m)   Wt 187 lb 9.6 oz (85.1 kg)   SpO2 97%   BMI 30.28 kg/m    General: tired appearing, NAD CV: RRR no murmurs Resp: CTAB Normal WOB GI: soft, non distended Extremities: no LE edema  Psych: alert and oriented. mood depressed. affect appropriate   ASSESSMENT/PLAN:   No problem-specific Assessment & Plan notes found for this encounter.  Hyponatremia  Hospital f/u  Patient feeling tired with low energy at visit. Denies vomiting, syncopal episodes. Does not feel that she needs to go to the hospital. Hyponatremia thought to be medication related SIADH, so was discontinued on  Duloxetine in the hospital, and per patient was discontinued on Citalopram though it was recommended that she taper. Tapering down Xanax- currently taking 1mg  BID. Patient was not aware of a nephrology referral. Unable to find in Epic so will request again. Patient already has follow up appt scheduled with PCP on 5/6- Should discuss mood and medication taper as well as recheck sodium levels. Can also check in to see if patient has scheduled to speak to psychiatry regarding medication changes.  - BMP  - continue Xanax 1mg  BID - placing another referral to nephrology  - f/u appt scheduled with PCP on 5/6    Shary Key, [place]

## 2020-10-09 NOTE — Chronic Care Management (AMB) (Signed)
   RN Case Manager Care Management   Phone Outreach    10/09/2020 Name: Karen Casey MRN: [id number] DOB: 03-25-1956  Karen Casey is a 65 y.o. year old female who is a primary care patient of Pray, Norwood Levo, MD .   First call to the patient by [facility] and she stated that she was not feeling well and asked if I could call her next week.  RNCM reminded the patient that she has an appointment at the office today at 11:15 am with Dr Arby Barrette and to go so she can be check.  She verbalized understanding.  Plan:Appointment was rescheduled CCM RNCM on 10/15/20 @ 9:00 AM  Review of patient status, including review of consultants reports, relevant laboratory and other test results, and collaboration with appropriate care team members and the patient's provider was performed as part of comprehensive patient evaluation and provision of care management services.    Lazaro Arms RN, BSN, [facility] Care Management Coordinator [place] Phone: [phone] I Fax: [phone]

## 2020-10-09 NOTE — Patient Instructions (Signed)
It was great seeing you today!  Today you came in to follow up on your hospital visit. We are doing blood work to check your sodium. You have discontinued Celexa and went down on your Xanax to 1mg  twice a day. You can continue that dose until you see your PCP for follow up.   Please see the attached mental health resources and phone numbers  For your back pain continue to use Tylenol and you can alternate ice and heat to that area.   Please make sure you schedule a follow up to see your PCP in the next 2-4 weeks,  but if you need to be seen earlier than that for any new issues we're happy to fit you in, just give [place] a call!  Regarding lab work today:  Due to recent changes in healthcare laws, you may see the results of your imaging and laboratory studies on MyChart before your provider has had a chance to review them.  I understand that in some cases there may be results that are confusing or concerning to you. Not all laboratory results come back in the same time frame and you may be waiting for multiple results in order to interpret others.  Please give [place] 72 hours in order for your provider to thoroughly review all the results before contacting the office for clarification of your results. If everything is normal, you will get a letter in the mail or a message in My Chart. Please give [place] a call if you do not hear from [place] after 2 weeks.  Please bring all of your medications with you to each visit.    If you haven't already, sign up for My Chart to have easy access to your labs results, and communication with your primary care physician.  Feel free to call with any questions or concerns at any time, at [phone].   Take care,  Dr. Shary Key [facility]

## 2020-10-10 LAB — BASIC METABOLIC PANEL
BUN/Creatinine Ratio: 10 — ABNORMAL LOW (ref 12–28)
BUN: 7 mg/dL — ABNORMAL LOW (ref 8–27)
CO2: 23 mmol/L (ref 20–29)
Calcium: 10.2 mg/dL (ref 8.7–10.3)
Chloride: 80 mmol/L — ABNORMAL LOW (ref 96–106)
Creatinine, Ser: 0.68 mg/dL (ref 0.57–1.00)
Glucose: 106 mg/dL — ABNORMAL HIGH (ref 65–99)
Potassium: 5 mmol/L (ref 3.5–5.2)
Sodium: 120 mmol/L — ABNORMAL LOW (ref 134–144)
eGFR: 98 mL/min/{1.73_m2} (ref >59)

## 2020-10-12 ENCOUNTER — Other Ambulatory Visit: Payer: Self-pay

## 2020-10-12 ENCOUNTER — Telehealth: Payer: Self-pay | Admitting: Family Medicine

## 2020-10-12 ENCOUNTER — Encounter (HOSPITAL_COMMUNITY): Payer: Self-pay | Admitting: Internal Medicine

## 2020-10-12 ENCOUNTER — Inpatient Hospital Stay (HOSPITAL_COMMUNITY): Payer: 59

## 2020-10-12 ENCOUNTER — Inpatient Hospital Stay (HOSPITAL_COMMUNITY)
Admission: EM | Admit: 2020-10-12 | Discharge: 2020-10-17 | DRG: 643 | Disposition: A | Discharge status: Home / Self Care | Payer: 59 | Attending: Internal Medicine | Admitting: Internal Medicine

## 2020-10-12 DIAGNOSIS — Z79899 Other long term (current) drug therapy: Secondary | ICD-10-CM

## 2020-10-12 DIAGNOSIS — E875 Hyperkalemia: Secondary | ICD-10-CM | POA: Diagnosis present

## 2020-10-12 DIAGNOSIS — Z7901 Long term (current) use of anticoagulants: Secondary | ICD-10-CM

## 2020-10-12 DIAGNOSIS — I10 Essential (primary) hypertension: Secondary | ICD-10-CM | POA: Diagnosis present

## 2020-10-12 DIAGNOSIS — F10231 Alcohol dependence with withdrawal delirium: Secondary | ICD-10-CM | POA: Diagnosis not present

## 2020-10-12 DIAGNOSIS — F1011 Alcohol abuse, in remission: Secondary | ICD-10-CM | POA: Diagnosis present

## 2020-10-12 DIAGNOSIS — R569 Unspecified convulsions: Secondary | ICD-10-CM

## 2020-10-12 DIAGNOSIS — R918 Other nonspecific abnormal finding of lung field: Secondary | ICD-10-CM | POA: Diagnosis not present

## 2020-10-12 DIAGNOSIS — I959 Hypotension, unspecified: Secondary | ICD-10-CM | POA: Diagnosis present

## 2020-10-12 DIAGNOSIS — E871 Hypo-osmolality and hyponatremia: Secondary | ICD-10-CM | POA: Diagnosis present

## 2020-10-12 DIAGNOSIS — Z20822 Contact with and (suspected) exposure to covid-19: Secondary | ICD-10-CM | POA: Diagnosis present

## 2020-10-12 DIAGNOSIS — F17210 Nicotine dependence, cigarettes, uncomplicated: Secondary | ICD-10-CM | POA: Diagnosis present

## 2020-10-12 DIAGNOSIS — G4730 Sleep apnea, unspecified: Secondary | ICD-10-CM | POA: Diagnosis present

## 2020-10-12 DIAGNOSIS — Z6830 Body mass index (BMI) 30.0-30.9, adult: Secondary | ICD-10-CM | POA: Diagnosis not present

## 2020-10-12 DIAGNOSIS — Z720 Tobacco use: Secondary | ICD-10-CM | POA: Diagnosis not present

## 2020-10-12 DIAGNOSIS — F32A Depression, unspecified: Secondary | ICD-10-CM | POA: Diagnosis present

## 2020-10-12 DIAGNOSIS — K219 Gastro-esophageal reflux disease without esophagitis: Secondary | ICD-10-CM | POA: Diagnosis present

## 2020-10-12 DIAGNOSIS — E43 Unspecified severe protein-calorie malnutrition: Secondary | ICD-10-CM | POA: Diagnosis present

## 2020-10-12 DIAGNOSIS — R296 Repeated falls: Secondary | ICD-10-CM | POA: Diagnosis present

## 2020-10-12 DIAGNOSIS — E222 Syndrome of inappropriate secretion of antidiuretic hormone: Secondary | ICD-10-CM | POA: Diagnosis present

## 2020-10-12 DIAGNOSIS — G8929 Other chronic pain: Secondary | ICD-10-CM | POA: Diagnosis present

## 2020-10-12 DIAGNOSIS — I480 Paroxysmal atrial fibrillation: Secondary | ICD-10-CM | POA: Diagnosis present

## 2020-10-12 DIAGNOSIS — Z7289 Other problems related to lifestyle: Secondary | ICD-10-CM

## 2020-10-12 DIAGNOSIS — Z789 Other specified health status: Secondary | ICD-10-CM

## 2020-10-12 DIAGNOSIS — F418 Other specified anxiety disorders: Secondary | ICD-10-CM | POA: Diagnosis present

## 2020-10-12 DIAGNOSIS — R0902 Hypoxemia: Secondary | ICD-10-CM | POA: Diagnosis not present

## 2020-10-12 DIAGNOSIS — F1090 Alcohol use, unspecified, uncomplicated: Secondary | ICD-10-CM

## 2020-10-12 DIAGNOSIS — J439 Emphysema, unspecified: Secondary | ICD-10-CM | POA: Diagnosis present

## 2020-10-12 LAB — BASIC METABOLIC PANEL
Anion gap: 9 (ref 5–15)
BUN: <5 mg/dL — ABNORMAL LOW (ref 8–23)
CO2: 25 mmol/L (ref 22–32)
Calcium: 8.9 mg/dL (ref 8.9–10.3)
Chloride: 88 mmol/L — ABNORMAL LOW (ref 98–111)
Creatinine, Ser: 0.61 mg/dL (ref 0.44–1.00)
GFR, Estimated: >60 mL/min (ref >60)
Glucose, Bld: 103 mg/dL — ABNORMAL HIGH (ref 70–99)
Potassium: 5.8 mmol/L — ABNORMAL HIGH (ref 3.5–5.1)
Sodium: 122 mmol/L — ABNORMAL LOW (ref 135–145)

## 2020-10-12 LAB — URINALYSIS, ROUTINE W REFLEX MICROSCOPIC
Bacteria, UA: NONE SEEN
Bilirubin Urine: NEGATIVE
Glucose, UA: NEGATIVE mg/dL
Hgb urine dipstick: NEGATIVE
Ketones, ur: NEGATIVE mg/dL
Leukocytes,Ua: NEGATIVE
Nitrite: NEGATIVE
Protein, ur: NEGATIVE mg/dL
Specific Gravity, Urine: 1.004 — ABNORMAL LOW (ref 1.005–1.030)
pH: 7 (ref 5.0–8.0)

## 2020-10-12 LAB — CBC
HCT: 43.3 % (ref 36.0–46.0)
Hemoglobin: 15.2 g/dL — ABNORMAL HIGH (ref 12.0–15.0)
MCH: 32 pg (ref 26.0–34.0)
MCHC: 35.1 g/dL (ref 30.0–36.0)
MCV: 91.2 fL (ref 80.0–100.0)
Platelets: 183 10*3/uL (ref 150–400)
RBC: 4.75 MIL/uL (ref 3.87–5.11)
RDW: 11.9 % (ref 11.5–15.5)
WBC: 8.1 10*3/uL (ref 4.0–10.5)
nRBC: 0 % (ref 0.0–0.2)

## 2020-10-12 LAB — ETHANOL: Alcohol, Ethyl (B): <10 mg/dL (ref <10)

## 2020-10-12 LAB — RESP PANEL BY RT-PCR (FLU A&B, COVID) ARPGX2
Influenza A by PCR: NEGATIVE
Influenza B by PCR: NEGATIVE
SARS Coronavirus 2 by RT PCR: NEGATIVE

## 2020-10-12 LAB — TSH: TSH: 2.413 u[IU]/mL (ref 0.350–4.500)

## 2020-10-12 LAB — OSMOLALITY: Osmolality: 259 mOsm/kg — ABNORMAL LOW (ref 275–295)

## 2020-10-12 LAB — BRAIN NATRIURETIC PEPTIDE: B Natriuretic Peptide: 74.8 pg/mL (ref 0.0–100.0)

## 2020-10-12 LAB — CK: Total CK: 24 U/L — ABNORMAL LOW (ref 38–234)

## 2020-10-12 LAB — SODIUM
Sodium: 123 mmol/L — ABNORMAL LOW (ref 135–145)
Sodium: 123 mmol/L — ABNORMAL LOW (ref 135–145)

## 2020-10-12 LAB — POTASSIUM: Potassium: 5.8 mmol/L — ABNORMAL HIGH (ref 3.5–5.1)

## 2020-10-12 LAB — TROPONIN I (HIGH SENSITIVITY)
Troponin I (High Sensitivity): 5 ng/L (ref <18)
Troponin I (High Sensitivity): 6 ng/L (ref <18)

## 2020-10-12 MED ORDER — METOPROLOL TARTRATE 25 MG PO TABS
12.5000 mg | ORAL_TABLET | Freq: Two times a day (BID) | ORAL | Status: DC
  Administered 2020-10-13 – 2020-10-17 (×9): 12.5 mg via ORAL
  Filled 2020-10-12 (×9): qty 1

## 2020-10-12 MED ORDER — APIXABAN 5 MG PO TABS
5.0000 mg | ORAL_TABLET | Freq: Two times a day (BID) | ORAL | Status: DC
  Administered 2020-10-12 – 2020-10-17 (×10): 5 mg via ORAL
  Filled 2020-10-12 (×10): qty 1

## 2020-10-12 MED ORDER — ONDANSETRON HCL 4 MG/2ML IJ SOLN: 4.0000 mg | Freq: Four times a day (QID) | INTRAMUSCULAR | Status: DC | PRN

## 2020-10-12 MED ORDER — NICOTINE 21 MG/24HR TD PT24
21.0000 mg | MEDICATED_PATCH | Freq: Every day | TRANSDERMAL | Status: DC
  Administered 2020-10-12 – 2020-10-17 (×6): 21 mg via TRANSDERMAL
  Filled 2020-10-12 (×6): qty 1

## 2020-10-12 MED ORDER — ALPRAZOLAM 1 MG PO TABS
1.0000 mg | ORAL_TABLET | Freq: Three times a day (TID) | ORAL | Status: DC
  Administered 2020-10-12 – 2020-10-13 (×3): 1 mg via ORAL
  Filled 2020-10-12: qty 2
  Filled 2020-10-12 (×2): qty 1

## 2020-10-12 MED ORDER — GABAPENTIN 300 MG PO CAPS
1200.0000 mg | ORAL_CAPSULE | Freq: Three times a day (TID) | ORAL | Status: DC
  Administered 2020-10-12 – 2020-10-15 (×10): 1200 mg via ORAL
  Filled 2020-10-12 (×10): qty 4

## 2020-10-12 MED ORDER — CITALOPRAM HYDROBROMIDE 10 MG PO TABS: 30.0000 mg | ORAL_TABLET | Freq: Every day | ORAL | Status: DC

## 2020-10-12 MED ORDER — THIAMINE HCL 100 MG PO TABS
100.0000 mg | ORAL_TABLET | Freq: Every day | ORAL | Status: DC
  Administered 2020-10-12 – 2020-10-17 (×6): 100 mg via ORAL
  Filled 2020-10-12 (×7): qty 1

## 2020-10-12 MED ORDER — SODIUM CHLORIDE 1 G PO TABS
1.0000 g | ORAL_TABLET | Freq: Two times a day (BID) | ORAL | Status: DC
  Administered 2020-10-13: 1 g via ORAL
  Filled 2020-10-12 (×2): qty 1

## 2020-10-12 MED ORDER — SODIUM CHLORIDE 0.9 % IV SOLN: INTRAVENOUS | Status: DC

## 2020-10-12 MED ORDER — ALPRAZOLAM 0.5 MG PO TABS: 1.0000 mg | ORAL_TABLET | Freq: Three times a day (TID) | ORAL | Status: DC

## 2020-10-12 MED ORDER — IOHEXOL 350 MG/ML SOLN
100.0000 mL | Freq: Once | INTRAVENOUS | Status: AC | PRN
  Administered 2020-10-12: 100 mL via INTRAVENOUS

## 2020-10-12 MED ORDER — ONDANSETRON HCL 4 MG PO TABS: 4.0000 mg | ORAL_TABLET | Freq: Four times a day (QID) | ORAL | Status: DC | PRN

## 2020-10-12 NOTE — ED Triage Notes (Addendum)
Pt from home via EMS c/o low Na+. Pt reports that she was recently admitted for the same. Pt PCP attempted to notify her today but could not contact her and sent [employer] for a welfare check. Pt advised [employer] at that time that she would go to ED. Pt is A&O x4 Pt received 500 cc NS through 20g L wrist.  EMS VS: 140/90, HR 70, CBG 161, 99% RA, 18 RR

## 2020-10-12 NOTE — Telephone Encounter (Signed)
Tried calling patient, her husband, daughter, and sister regarding patient's low sodium results. Left message on sister's phone but voicemail was not set up on the other lines. Discussed with Dr. Owens Shark who also tried calling family. Will place a wellfare call to the [place] office if I do not hear back this afternoon.

## 2020-10-12 NOTE — ED Provider Notes (Signed)
[facility] DEPT Provider Note   CSN: [id number] Arrival date & time: 10/12/20  1726     History Chief Complaint  Patient presents with  . Abnormal Lab    Karen Casey is a 65 y.o. female with a history of hyponatremia, chronic alcohol use and withdrawal, presenting to emergency department with concern for low sodium.  Patient was contacted by her PCP who had blood test done 3 days ago, because her sodium was 120.  She was advised to come to the emergency department.  She presents by EMS today.  She feels fatigued but otherwise has no acute complaints.  She denies any recent seizures.  She reports she stopped drinking after her last hospitalization.  She is on Xanax 3 times a day and is being tapered down.  HPI     Past Medical History:  Diagnosis Date  . Alcohol abuse   . Allergy   . Anxiety   . Cataract 06/09/2012   Right eye and left eye  . Depression   . GERD (gastroesophageal reflux disease)   . 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 Seizures ([place])    xanax withdrawl- December 2013  . Urinary incontinence 06/09/2012    Patient Active Problem List   Diagnosis Date Noted  . SIADH (syndrome of inappropriate ADH production) ([place]) 10/12/2020  . Hyperkalemia 10/12/2020  . Other chest pain   . Hypomagnesemia   . Altered mental status   . Volume overload 09/21/2020  . Acute respiratory insufficiency   . Long term (current) use of anticoagulants   . Protein-calorie malnutrition ([place]) 09/17/2020  . Persistent atrial fibrillation ([place])   . Alcohol withdrawal seizure with delirium ([place])   . Status epilepticus ([place])   . Hyponatremia   . Paroxysmal atrial fibrillation ([facility])   . Shortness of breath   . Precordial pain   . Alcohol withdrawal ([place]) 09/10/2020  . Pulmonary nodules/lesions, multiple 07/14/2020  . Chronic left shoulder pain 06/22/2020  . Allergic conjunctivitis of both eyes 06/22/2020  . Hammer toe  03/06/2020  . HTN (hypertension) 06/02/2018  . Palpitations 06/02/2018  . Body mass index (BMI) 31.0-31.9, adult 03/29/2013  . Back pain 02/14/2013  . Anxiety disorder 06/12/2012  . Alcohol use 05/28/2012  . Irritable bowel syndrome 05/10/2012  . Tobacco abuse 02/17/2012  . GERD (gastroesophageal reflux disease) 02/04/2012  . Depression 02/04/2012    Past Surgical History:  Procedure Laterality Date  . APPENDECTOMY    . CATARACT EXTRACTION  06/09/2012   Left eye  . left shoulder dislocation  Sept 2011     OB History   No obstetric history on file.     Family History  Problem Relation Age of Onset  . Hypertension Mother   . Hyperlipidemia Mother   . Aneurysm Mother        Rupture - Cause of death  . Heart disease Father        MI - cause of death  . Depression Father   . Parkinsonism Father   . Colon cancer Neg Hx   . Esophageal cancer Neg Hx   . Rectal cancer Neg Hx   . Stomach cancer Neg Hx     Social History   Tobacco Use  . Smoking status: Current Every Day Smoker    Packs/day: 1.00    Years: 43.00    Pack years: 43.00    Types: Cigarettes  . Smokeless tobacco: Never Used  Vaping Use  .  Vaping Use: Never used  Substance Use Topics  . Alcohol use: Yes    Alcohol/week: 3.0 standard drinks    Types: 3 Cans of beer per week    Comment: 2-3 times  . Drug use: Yes    Types: Marijuana, Other-see comments    Comment: Past hx of benzo abuse--xanax    Home Medications Prior to Admission medications   Medication Sig Start Date End Date Taking? Authorizing Provider  ALPRAZolam Duanne Moron) 1 MG tablet Take 2 tablets (2 mg total) by mouth 2 (two) times daily. 09/19/20   Lurline Del, DO  apixaban (ELIQUIS) 5 MG TABS tablet Take 1 tablet (5 mg total) by mouth 2 (two) times daily. 09/19/20   Lurline Del, DO  ARIPiprazole (ABILIFY) 5 MG tablet Take 5 mg by mouth daily. 05/14/20   [provider]  fluticasone (FLONASE) 50 MCG/ACT nasal spray SHAKE LIQUID AND  USE 2 SPRAYS IN EACH NOSTRIL DAILY Patient taking differently: Place 2 sprays into both nostrils daily as needed for allergies or rhinitis. 07/15/20   [name], MD  gabapentin (NEURONTIN) 600 MG tablet Take 2 tablets (1,200 mg total) by mouth 3 (three) times daily. 02/20/18   Alveda Reasons, MD  metoprolol tartrate (LOPRESSOR) 100 MG tablet Take 1 tablet (100 mg total) by mouth 2 (two) times daily. 09/19/20   Lurline Del, DO  Multiple Vitamins-Minerals (CENTRUM SILVER 50+WOMEN PO) Take 1 tablet by mouth daily.    [provider]  omeprazole (PRILOSEC) 20 MG capsule Take 1 capsule (20 mg total) by mouth daily. 09/22/20   Simmons-Robinson, Riki Sheer, MD  Probiotic Product (PROBIOTIC PO) Take 1 capsule by mouth daily.    [provider]  QUEtiapine Fumarate (SEROQUEL XR) 150 MG 24 hr tablet Take 150 mg by mouth at bedtime. 09/02/20   [provider]  urea (URE-NA) 15 g PACK oral packet Take 15 g by mouth daily. 09/29/20   Simmons-Robinson, Riki Sheer, MD    Allergies    Patient has no known allergies.  Review of Systems   Review of Systems  Constitutional: Positive for fatigue. Negative for chills and fever.  HENT: Negative for ear pain and sore throat.   Eyes: Negative for pain and visual disturbance.  Respiratory: Negative for cough and shortness of breath.   Cardiovascular: Negative for chest pain and palpitations.  Gastrointestinal: Negative for abdominal pain and vomiting.  Genitourinary: Negative for dysuria and hematuria.  Musculoskeletal: Negative for arthralgias and back pain.  Skin: Negative for color change and rash.  Neurological: Positive for dizziness. Negative for seizures and syncope.  All other systems reviewed and are negative.   Physical Exam Updated Vital Signs BP (!) 138/92 (BP Location: Left Arm)   Pulse 75   Temp (!) 97.4 F (36.3 C) (Oral)   Resp 16   Ht 5\' 6"  (1.676 m)   Wt 85.1 kg   SpO2 97%   BMI 30.28 kg/m   Physical  Exam Constitutional:      General: She is not in acute distress. HENT:     Head: Normocephalic and atraumatic.  Eyes:     Conjunctiva/sclera: Conjunctivae normal.     Pupils: Pupils are equal, round, and reactive to light.  Cardiovascular:     Rate and Rhythm: Normal rate and regular rhythm.  Pulmonary:     Effort: Pulmonary effort is normal. No respiratory distress.  Abdominal:     General: There is no distension.     Tenderness: There is no abdominal  tenderness.  Skin:    General: Skin is warm and dry.  Neurological:     General: No focal deficit present.     Mental Status: She is alert and oriented to person, place, and time. Mental status is at baseline.  Psychiatric:        Mood and Affect: Mood normal.        Behavior: Behavior normal.     ED Results / Procedures / Treatments   Labs (all labs ordered are listed, but only abnormal results are displayed) Labs Reviewed  BASIC METABOLIC PANEL - Abnormal; Notable for the following components:      Result Value   Sodium 122 (*)    Potassium 5.8 (*)    Chloride 88 (*)    Glucose, Bld 103 (*)    BUN <5 (*)    All other components within normal limits  CBC - Abnormal; Notable for the following components:   Hemoglobin 15.2 (*)    All other components within normal limits  URINALYSIS, ROUTINE W REFLEX MICROSCOPIC - Abnormal; Notable for the following components:   Color, Urine STRAW (*)    Specific Gravity, Urine 1.004 (*)    All other components within normal limits  OSMOLALITY - Abnormal; Notable for the following components:   Osmolality 259 (*)    All other components within normal limits  POTASSIUM - Abnormal; Notable for the following components:   Potassium 5.8 (*)    All other components within normal limits  SODIUM - Abnormal; Notable for the following components:   Sodium 123 (*)    All other components within normal limits  SODIUM - Abnormal; Notable for the following components:   Sodium 123 (*)    All  other components within normal limits  CK - Abnormal; Notable for the following components:   Total CK 24 (*)    All other components within normal limits  RESP PANEL BY RT-PCR (FLU A&B, COVID) ARPGX2  ETHANOL  BRAIN NATRIURETIC PEPTIDE  MAGNESIUM  SODIUM  SODIUM  SODIUM  SODIUM  SODIUM  TSH  SODIUM  TROPONIN I (HIGH SENSITIVITY)  TROPONIN I (HIGH SENSITIVITY)  TROPONIN I (HIGH SENSITIVITY)    EKG EKG Interpretation  Date/Time:  Sunday Oct 12 2020 19:47:15 EDT Ventricular Rate:  73 PR Interval:  165 QRS Duration: 79 QT Interval:  403 QTC Calculation: 445 R Axis:   4 Text Interpretation: Sinus rhythm Probable left atrial enlargement Confirmed by Octaviano Glow [phone]) on 10/12/2020 8:00:56 PM   Radiology CT ANGIO CHEST PE W OR WO CONTRAST  Result Date: 10/12/2020 CLINICAL DATA:  Short of breath, chest pain EXAM: CT ANGIOGRAPHY CHEST WITH CONTRAST TECHNIQUE: Multidetector CT imaging of the chest was performed using the standard protocol during bolus administration of intravenous contrast. Multiplanar CT image reconstructions and MIPs were obtained to evaluate the vascular anatomy. CONTRAST:  125mL OMNIPAQUE IOHEXOL 350 MG/ML SOLN COMPARISON:  07/14/2020, 09/25/2020 FINDINGS: Cardiovascular: This is a technically adequate evaluation of the pulmonary vasculature. No filling defects or pulmonary emboli. The heart is unremarkable without pericardial effusion. Normal caliber of the thoracic aorta, with mild atherosclerosis of the aortic arch. Mediastinum/Nodes: No enlarged mediastinal, hilar, or axillary lymph nodes. Thyroid gland, trachea, and esophagus demonstrate no significant findings. Lungs/Pleura: No acute airspace disease, effusion, or pneumothorax. Scattered linear opacities within the mid and lower lung zones most consistent with subsegmental atelectasis. The central airways are patent. Upper Abdomen: No acute abnormality. Musculoskeletal: Multiple compression deformities have  developed in the interim  since prior study. A vertebral plana has developed at the T6 level, with moderate retropulsion resulting in narrowing of the central canal by approximately 50%. Compression deformity involving the mid to lower aspect of the T5 vertebral body results in approximately 25% loss of height. No retropulsion. There are new compression deformities involving the superior endplates at QA348G, 624THL, and T12, all with less than 25% loss of height and no retropulsion. Chronic compression deformity of the T2 vertebral body superior endplate is again noted unchanged. Reconstructed images demonstrate no additional findings. Review of the MIP images confirms the above findings. IMPRESSION: 1. No evidence of pulmonary embolus. 2. Interval development of multiple thoracic compression deformities since the 07/14/2020 CT exam. Vertebral plana at T6 with retropulsion results in narrowing of the central canal by approximately 50%. New compression deformities at T5, T10, T11, and T12 as above. 3. Scattered subsegmental atelectasis bilaterally. No acute airspace disease. 4.  Aortic Atherosclerosis (ICD10-I70.0). Electronically Signed   By: Randa Ngo M.D.   On: 10/12/2020 22:59   CT ABDOMEN PELVIS W CONTRAST  Result Date: 10/12/2020 CLINICAL DATA:  Abdominal pain, hyponatremia EXAM: CT ABDOMEN AND PELVIS WITH CONTRAST TECHNIQUE: Multidetector CT imaging of the abdomen and pelvis was performed using the standard protocol following bolus administration of intravenous contrast. CONTRAST:  159mL OMNIPAQUE IOHEXOL 350 MG/ML SOLN COMPARISON:  09/09/2020, 07/14/2020, 10/14/2017 FINDINGS: Lower chest: Lung bases are clear. Hepatobiliary: No focal liver abnormality is seen. No gallstones, gallbladder wall thickening, or biliary dilatation. Pancreas: Unremarkable. No pancreatic ductal dilatation or surrounding inflammatory changes. Spleen: Normal in size without focal abnormality. Adrenals/Urinary Tract: Adrenal glands are  unremarkable. Kidneys are normal, without renal calculi, focal lesion, or hydronephrosis. Bladder is unremarkable. Stomach/Bowel: No bowel obstruction or ileus. No bowel wall thickening or inflammatory change. Minimal sigmoid diverticulosis. Vascular/Lymphatic: Aortic atherosclerosis. No enlarged abdominal or pelvic lymph nodes. Reproductive: Uterus and bilateral adnexa are unremarkable. Other: No free fluid or free gas.  No abdominal wall hernia. Musculoskeletal: Age-indeterminate compression deformities have developed within the superior endplates of QA348G, 624THL, and T12, new since 07/14/2020. No other acute bony abnormalities. Prominent spondylosis at the lumbosacral junction unchanged. IMPRESSION: 1. Age-indeterminate compression deformities involving the superior endplates of QA348G, 624THL, and T12. 2. Sigmoid diverticulosis without diverticulitis. 3.  Aortic Atherosclerosis (ICD10-I70.0). Electronically Signed   By: Randa Ngo M.D.   On: 10/12/2020 23:15    Procedures Procedures   Medications Ordered in ED Medications  0.9 %  sodium chloride infusion ( Intravenous New Bag/Given 10/12/20 2315)  nicotine (NICODERM CQ - dosed in mg/24 hours) patch 21 mg (21 mg Transdermal Patch Applied 10/12/20 2056)  sodium chloride tablet 1 g (has no administration in time range)  ALPRAZolam (XANAX) tablet 1 mg (1 mg Oral Given 10/12/20 2212)  apixaban (ELIQUIS) tablet 5 mg (5 mg Oral Given 10/12/20 2212)  gabapentin (NEURONTIN) capsule 1,200 mg (1,200 mg Oral Given 10/12/20 2211)  metoprolol tartrate (LOPRESSOR) tablet 12.5 mg (12.5 mg Oral Not Given 10/12/20 2131)  ondansetron (ZOFRAN) tablet 4 mg (has no administration in time range)    Or  ondansetron (ZOFRAN) injection 4 mg (has no administration in time range)  thiamine tablet 100 mg (100 mg Oral Given 10/12/20 2212)  iohexol (OMNIPAQUE) 350 MG/ML injection 100 mL (100 mLs Intravenous Contrast Given 10/12/20 2221)    ED Course  I have reviewed the triage vital signs  and the nursing notes.  Pertinent labs & imaging results that were available during my care of the patient were  reviewed by me and considered in my medical decision making (see chart for details).  This patient is here with hyponatremia from outpatient labs..  No recent seizure or evidence of encephalopathy/AMS.  She reports no recent beer or alcohol consumption.  From last hospital visits, concerns for possible SIADH I ordered, reviewed, and interpreted labs.  Na 122, Glucose 103, K 5.8, Cr 0.61.  CBC near baseline. I ordered medication IV fluid infusion for hyponatremia I independently visualized and interpreted imaging which showed no life-threatening abnormalities, and the monitor tracing which showed NSR Previous records obtained and reviewed showing prior hospitalization  ECG with no acute ischemic changes or hyperK changes.  After the interventions stated above, I reevaluated the patient and found she remained clinically stable, comfortable.  Plan for admission for hyponatremia/hyperkalemia.   Clinical Course as of 10/12/20 2341  Sun Oct 12, 2020  4128 Admitted to hospitalist Dr Posey Pronto. [MT]    Clinical Course User Index [MT] Shamyra Farias, Carola Rhine, MD    Final Clinical Impression(s) / ED Diagnoses Final diagnoses:  Hyponatremia    Rx / DC Orders ED Discharge Orders    None       Wyvonnia Dusky, MD 10/12/20 2341

## 2020-10-12 NOTE — ED Notes (Signed)
Patient transported to CT 

## 2020-10-12 NOTE — H&P (Signed)
History and Physical    Karen Casey QHU:[id number] DOB: Dec 08, 1955 DOA: 10/12/2020  PCP: [name], MD    Patient coming from:  Home   Chief Complaint:  Hyponatremia   HPI: Karen Casey is a 65 y.o. female seen in ed with complaints of abnormal lab found to be hyponatremic with a serum sodium of 120.  Patient was following up with her PCP who had blood work done for her low serum sodium and was advised to come to the emergency room.  Patient does report some generalized fatigue but no seizures no headaches no chest pain shortness of breath blurred vision gait abnormalities any abdominal issues any breathing issues.  And review of systems is otherwise negative as mentioned below.  Medication reconciliation is pending and from previous admission patient is to taper down her Celexa, Cymbalta was stopped patient is also tapering down her Xanax.  By EMS patient received 500 cc of normal saline.Pt appears dry and weak and usually uses walker for ambulation. Reports chronic back pain and today mideigastric pain from her reflex and left sided chest pain along with abdominal pain.she is poor historian and doe snot recall detail of her symptoms. Abd pain is nonradiating. Headache is 4/10 frontal area.  Patient was admitted on 09/25/2020 for same presentation and discharged on 09/28/2020 with a plan for SIADH related hyponatremia, sodium tablets, fluid restriction, modification of medications and discontinuation of Celexa and Cymbalta.PT was given ivf due to dehydration presentation in previous admission as well similar to this one however no improvement of sodium. Pt had received tolvaptan and urea and pts celexa was tapered and cymbalta was discontinued. Pt does have h/o tobacco abuse and last chest ct for lung cancer screening was in jan 2022 showing multiple lung nodules. Lungs/Pleura: No pneumothorax. No pleural effusion. No acute consolidative airspace disease or lung masses. Mild  centrilobular emphysema with mild diffuse bronchial wall thickening. Numerous scattered solid pulmonary nodules in the right greater than left lungs with dominant clustered nodularity in the medial right lower lobe measuring up to 6.8 mm in volume derived mean diameter , for full report  See results. Chest xray on 4/14 shows stable left lingular subsegmental atelectasis or scarring.    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  ED Course:  Vitals:   10/12/20 1900 10/12/20 2000 10/12/20 2052 10/12/20 2059  BP: 113/84 118/83 121/81 118/83  Pulse: 74 75 78 75  Resp: (!) 25 (!) 25 18   Temp:      TempSrc:      SpO2: 95% 95% 97%   Weight:      Height:      In the emergency room patient is alert awake oriented, afebrile, vitals are stable. CMP shows a serum sodium of 122 and chloride of 88, potassium of 5.8, glucose of 103, creatinine of 0.61, EGFR more than 60, magnesium level pending, troponin pending, CBC shows a normal white count of 8.1, hemoglobin of 15.2, RDW of 11.9, platelets of 183. Serum alcohol level ordered.  Discussed with the ED provider to start normal saline at 40 cc an hour just as a maintenance as sodium and chloride are both trending down and applying dehydration.  Chart reviewed echocardiogram reviewed last chest CT reviewed.  Repeat potassium for mild hyperkalemia.  Review of Systems:  Review of Systems  Constitutional: Positive for malaise/fatigue.  HENT: Negative.   Eyes: Negative.   Respiratory:  Positive for cough.   Cardiovascular: Positive for chest pain.  Gastrointestinal: Positive for abdominal pain.  Genitourinary: Negative.   Musculoskeletal: Negative.   Skin: Negative.   Neurological: Positive for weakness.  All other systems reviewed and are negative.   Past Medical History:  Diagnosis Date  . Alcohol abuse   . Allergy   . Anxiety   . Cataract  06/09/2012   Right eye and left eye  . Depression   . GERD (gastroesophageal reflux disease)   . 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 Seizures ([place])    xanax withdrawl- December 2013  . Urinary incontinence 06/09/2012    Past Surgical History:  Procedure Laterality Date  . APPENDECTOMY    . CATARACT EXTRACTION  06/09/2012   Left eye  . left shoulder dislocation  Sept 2011     reports that she has been smoking cigarettes. She has a 43.00 pack-year smoking history. She has never used smokeless tobacco. She reports current alcohol use of about 3.0 standard drinks of alcohol per week. She reports current drug use. Drugs: Marijuana and Other-see comments.  No Known Allergies  Family History  Problem Relation Age of Onset  . Hypertension Mother   . Hyperlipidemia Mother   . Aneurysm Mother        Rupture - Cause of death  . Heart disease Father        MI - cause of death  . Depression Father   . Parkinsonism Father   . Colon cancer Neg Hx   . Esophageal cancer Neg Hx   . Rectal cancer Neg Hx   . Stomach cancer Neg Hx     Prior to Admission medications   Medication Sig Start Date End Date Taking? Authorizing Provider  ALPRAZolam Duanne Moron) 1 MG tablet Take 2 tablets (2 mg total) by mouth 2 (two) times daily. 09/19/20   Lurline Del, DO  apixaban (ELIQUIS) 5 MG TABS tablet Take 1 tablet (5 mg total) by mouth 2 (two) times daily. 09/19/20   Lurline Del, DO  ARIPiprazole (ABILIFY) 5 MG tablet Take 5 mg by mouth daily. 05/14/20   [provider]  citalopram (CELEXA) 10 MG tablet Take 3 tablets (30 mg total) by mouth daily. 09/28/20   Simmons-Robinson, Makiera, MD  fluticasone (FLONASE) 50 MCG/ACT nasal spray SHAKE LIQUID AND USE 2 SPRAYS IN EACH NOSTRIL DAILY Patient taking differently: Place 2 sprays into both nostrils daily as needed for allergies or rhinitis. 07/15/20   [name], MD  gabapentin (NEURONTIN) 600 MG tablet Take 2 tablets (1,200 mg  total) by mouth 3 (three) times daily. 02/20/18   Alveda Reasons, MD  metoprolol tartrate (LOPRESSOR) 100 MG tablet Take 1 tablet (100 mg total) by mouth 2 (two) times daily. 09/19/20   Lurline Del, DO  Multiple Vitamins-Minerals (CENTRUM SILVER 50+WOMEN PO) Take 1 tablet by mouth daily.    [provider]  omeprazole (PRILOSEC) 20 MG capsule Take 1 capsule (20 mg total) by mouth daily. 09/22/20   Simmons-Robinson, Riki Sheer, MD  Probiotic Product (PROBIOTIC PO) Take 1 capsule by mouth daily.    [provider]  QUEtiapine Fumarate (SEROQUEL XR) 150 MG 24 hr tablet Take 150 mg by mouth at bedtime. 09/02/20   [provider]  urea (URE-NA) 15 g PACK oral packet Take 15 g by mouth daily. 09/29/20   Eulis Foster, MD    Physical Exam: Vitals:   10/12/20 1900 10/12/20 2000 10/12/20 2052 10/12/20  2059  BP: 113/84 118/83 121/81 118/83  Pulse: 74 75 78 75  Resp: (!) 25 (!) 25 18   Temp:      TempSrc:      SpO2: 95% 95% 97%   Weight:      Height:       Physical Exam Constitutional:      General: She is not in acute distress.    Appearance: She is not ill-appearing, toxic-appearing or diaphoretic.  HENT:     Head: Normocephalic and atraumatic.     Right Ear: External ear normal.     Left Ear: External ear normal.     Nose: Nose normal.     Mouth/Throat:     Mouth: Mucous membranes are dry.  Eyes:     Extraocular Movements: Extraocular movements intact.     Pupils: Pupils are equal, round, and reactive to light.  Neck:     Vascular: No carotid bruit.  Cardiovascular:     Rate and Rhythm: Normal rate and regular rhythm.     Pulses: Normal pulses.     Heart sounds: Normal heart sounds.  Pulmonary:     Effort: Pulmonary effort is normal.     Breath sounds: Normal breath sounds.  Abdominal:     General: Bowel sounds are normal. There is no distension or abdominal bruit. There are no signs of injury.     Palpations: Abdomen is soft. There is no  hepatomegaly, splenomegaly or mass.     Tenderness: There is abdominal tenderness. There is no guarding.     Hernia: No hernia is present.    Musculoskeletal:       Arms:     Right lower leg: No edema.     Left lower leg: No edema.  Skin:    General: Skin is warm and dry.  Neurological:     General: No focal deficit present.     Mental Status: She is alert and oriented to person, place, and time.  Psychiatric:        Mood and Affect: Mood normal.        Behavior: Behavior normal.      Labs on Admission: I have personally reviewed following labs and imaging studies  No results for input(s): CKTOTAL, CKMB, TROPONINI in the last 72 hours. Lab Results  Component Value Date   WBC 8.1 10/12/2020   HGB 15.2 (H) 10/12/2020   HCT 43.3 10/12/2020   MCV 91.2 10/12/2020   PLT 183 10/12/2020    Recent Labs  Lab 10/12/20 1747 10/12/20 1937  NA 122* 123*  K 5.8* 5.8*  CL 88*  --   CO2 25  --   BUN <5*  --   CREATININE 0.61  --   CALCIUM 8.9  --   GLUCOSE 103*  --    Lab Results  Component Value Date   CHOL 97 09/17/2020   HDL 41 09/17/2020   LDLCALC 42 09/17/2020   TRIG 68 09/17/2020   Lab Results  Component Value Date   DDIMER  05/14/2007    <0.22        AT THE INHOUSE ESTABLISHED CUTOFF VALUE OF 0.48 ug/mL FEU, THIS ASSAY HAS BEEN DOCUMENTED IN THE LITERATURE TO HAVE   Invalid input(s): POCBNP  Urinalysis    Component Value Date/Time   COLORURINE YELLOW 09/25/2020 1826   APPEARANCEUR CLEAR 09/25/2020 1826   LABSPEC 1.014 09/25/2020 1826   PHURINE 7.0 09/25/2020 1826   GLUCOSEU NEGATIVE 09/25/2020 1826   HGBUR  NEGATIVE 09/25/2020 1826   BILIRUBINUR NEGATIVE 09/25/2020 1826   BILIRUBINUR negative 08/23/2017 0936   BILIRUBINUR NEG 12/22/2015 1410   KETONESUR NEGATIVE 09/25/2020 1826   PROTEINUR NEGATIVE 09/25/2020 1826   UROBILINOGEN 0.2 08/23/2017 0936   UROBILINOGEN 1.0 03/09/2013 1013   NITRITE NEGATIVE 09/25/2020 1826   LEUKOCYTESUR NEGATIVE  09/25/2020 1826   COVID-19 Labs  No results for input(s): DDIMER, FERRITIN, LDH, CRP in the last 72 hours.  Lab Results  Component Value Date   SARSCOV2NAA NEGATIVE 10/12/2020   [place] NEGATIVE 09/25/2020   [place] NEGATIVE 09/09/2020    Radiological Exams on Admission: No results found.  EKG: Independently reviewed.  Sinus rhythm.  Echocardiogram( 09/10/2020 ): IMPRESSIONS  1. Left ventricular ejection fraction, by estimation, is 60 to 65%. The  left ventricle has normal function. The left ventricle has no regional  wall motion abnormalities. There is mild left ventricular hypertrophy.  Left ventricular diastolic parameters  were normal.  2. Right ventricular systolic function is normal. The right ventricular  size is normal. There is normal pulmonary artery systolic pressure.  3. The mitral valve is normal in structure. Trivial mitral valve  regurgitation. No evidence of mitral stenosis.  4. The aortic valve is tricuspid. Aortic valve regurgitation is not  visualized. No aortic stenosis is present.  5. The inferior vena cava is normal in size with greater than 50%  respiratory variability, suggesting right atrial pressure of 3 mmHg.    Assessment/Plan Principal Problem:   Hyponatremia Active Problems:   Pulmonary nodules/lesions, multiple   SIADH (syndrome of inappropriate ADH production) (HCC)   HTN (hypertension)   Paroxysmal atrial fibrillation (HCC)   GERD (gastroesophageal reflux disease)   Depression   Tobacco abuse   Alcohol use   Alcohol withdrawal seizure with delirium (HCC)   Hypomagnesemia   Hyperkalemia   Hyponatremia/SIADH/ Pulmonary nodules: -Cont ns at 40 cc/hour. -salt tablets. -neuro checks q4 hours.  -Chest CT to identify progression of pulmonary nodule, I suspect underlying ca. -Serum sodium q 2 hours. -strict I/O.  Chest pain: -we will obtain ct chest and troponin negative. -ekg wnl Sinus rhythm normal axis / no st  changes.  Abdominal pain: Ct abd and pelvis. We will also obtain lipase  Liver function test.    HTN: -bp is low  We will hold home medication.  PAF: -Continue eliquis.  GERD: -Avoid NSAID's / IV ppi. - carafate if needed.  Depression: - Med rec pending. - no changes cont celexa.  Tobacco abuse: -nicotine patch.  Alcohol abuse? H/o withdrawal; seizures: -CIWA protocol. -alcohol level pending. -last drink over a month ago.  Hypomagnesemia: -will get levels and replace.  Hyperkalemia: ? Hemolysis, we will repeat level and follow. Renal function wnl. Repeat level still at 5.8, no ekg changes ,we will repeat again after hydration And consider kayexalate.   DVT prophylaxis:  Eliquis  Code Status:  Full code   Family Communication:  Charline Bills (Sister)  [phone] [facility] Phone)   Disposition Plan:  Home   Consults called:  None  Admission status: Inpatient.    Para Skeans MD [facility] [phone] How to contact the [facility] Attending or Consulting provider [place] or covering provider during after hours [place], for this patient.    1. Check the care team in [facility] and look for a) attending/consulting [place] provider listed and b) the [facility] team listed 2. Log into [email or web address] and use [place]'s universal password to access. If you do not have the password,  please contact the hospital operator. 3. Locate the [facility] provider you are looking for under [facility] and page to a number that you can be directly reached. 4. If you still have difficulty reaching the provider, please page the [facility] (Director on Call) for the Hospitalists listed on amion for assistance. [email or web address] Password [email or web address] 10/12/2020, 9:22 PM

## 2020-10-12 NOTE — Telephone Encounter (Signed)
Still unable to reach patient or family members. [place]  Department at [phone] and discussed with dispatcher that we are concerned for this patient's health and have not been able to reach her all day to discuss important results that have caused her to be hospitalized recently, and that I am concerned that she needs to be in the hospital again based on her results. Dispatcher stated he will have an officer check on patient. Gave my cell phone number in case there are any questions or concerns.

## 2020-10-12 NOTE — Telephone Encounter (Signed)
Patient returned my call with her husband after an officer checked on her. They stated they did not answer because they did not recognize my number. Patient is sitll not feeling well and will head to the hospital immediately. Family was very appreciative of the follow up.

## 2020-10-13 LAB — BASIC METABOLIC PANEL
Anion gap: 11 (ref 5–15)
Anion gap: 9 (ref 5–15)
Anion gap: 9 (ref 5–15)
Anion gap: 10 (ref 5–15)
BUN: <5 mg/dL — ABNORMAL LOW (ref 8–23)
BUN: 17 mg/dL (ref 8–23)
BUN: 18 mg/dL (ref 8–23)
BUN: 18 mg/dL (ref 8–23)
CO2: 24 mmol/L (ref 22–32)
CO2: 25 mmol/L (ref 22–32)
CO2: 26 mmol/L (ref 22–32)
CO2: 28 mmol/L (ref 22–32)
Calcium: 9.4 mg/dL (ref 8.9–10.3)
Calcium: 9.1 mg/dL (ref 8.9–10.3)
Calcium: 9 mg/dL (ref 8.9–10.3)
Calcium: 9.5 mg/dL (ref 8.9–10.3)
Chloride: 89 mmol/L — ABNORMAL LOW (ref 98–111)
Chloride: 87 mmol/L — ABNORMAL LOW (ref 98–111)
Chloride: 89 mmol/L — ABNORMAL LOW (ref 98–111)
Chloride: 92 mmol/L — ABNORMAL LOW (ref 98–111)
Creatinine, Ser: 0.54 mg/dL (ref 0.44–1.00)
Creatinine, Ser: 0.49 mg/dL (ref 0.44–1.00)
Creatinine, Ser: 0.53 mg/dL (ref 0.44–1.00)
Creatinine, Ser: 0.47 mg/dL (ref 0.44–1.00)
GFR, Estimated: >60 mL/min (ref >60)
GFR, Estimated: >60 mL/min (ref >60)
GFR, Estimated: >60 mL/min (ref >60)
GFR, Estimated: >60 mL/min (ref >60)
Glucose, Bld: 169 mg/dL — ABNORMAL HIGH (ref 70–99)
Glucose, Bld: 108 mg/dL — ABNORMAL HIGH (ref 70–99)
Glucose, Bld: 123 mg/dL — ABNORMAL HIGH (ref 70–99)
Glucose, Bld: 115 mg/dL — ABNORMAL HIGH (ref 70–99)
Potassium: 4.2 mmol/L (ref 3.5–5.1)
Potassium: 4 mmol/L (ref 3.5–5.1)
Potassium: 4 mmol/L (ref 3.5–5.1)
Potassium: 4.5 mmol/L (ref 3.5–5.1)
Sodium: 124 mmol/L — ABNORMAL LOW (ref 135–145)
Sodium: 121 mmol/L — ABNORMAL LOW (ref 135–145)
Sodium: 124 mmol/L — ABNORMAL LOW (ref 135–145)
Sodium: 130 mmol/L — ABNORMAL LOW (ref 135–145)

## 2020-10-13 LAB — SODIUM
Sodium: 126 mmol/L — ABNORMAL LOW (ref 135–145)
Sodium: 127 mmol/L — ABNORMAL LOW (ref 135–145)
Sodium: 128 mmol/L — ABNORMAL LOW (ref 135–145)

## 2020-10-13 LAB — MAGNESIUM: Magnesium: 1.5 mg/dL — ABNORMAL LOW (ref 1.7–2.4)

## 2020-10-13 LAB — TROPONIN I (HIGH SENSITIVITY): Troponin I (High Sensitivity): 5 ng/L (ref <18)

## 2020-10-13 MED ORDER — ADULT MULTIVITAMIN W/MINERALS CH
1.0000 | ORAL_TABLET | Freq: Every day | ORAL | Status: DC
  Administered 2020-10-13 – 2020-10-17 (×5): 1 via ORAL
  Filled 2020-10-13 (×4): qty 1

## 2020-10-13 MED ORDER — SENNA 8.6 MG PO TABS
1.0000 | ORAL_TABLET | Freq: Every day | ORAL | Status: DC
  Administered 2020-10-13 – 2020-10-14 (×2): 8.6 mg via ORAL
  Filled 2020-10-13 (×2): qty 1

## 2020-10-13 MED ORDER — BISACODYL 10 MG RE SUPP: 10.0000 mg | Freq: Every day | RECTAL | Status: DC | PRN

## 2020-10-13 MED ORDER — MAGNESIUM SULFATE 2 GM/50ML IV SOLN
2.0000 g | Freq: Once | INTRAVENOUS | Status: AC
  Administered 2020-10-13: 2 g via INTRAVENOUS
  Filled 2020-10-13: qty 50

## 2020-10-13 MED ORDER — ALPRAZOLAM 1 MG PO TABS
2.0000 mg | ORAL_TABLET | Freq: Two times a day (BID) | ORAL | Status: DC
  Administered 2020-10-13 – 2020-10-17 (×8): 2 mg via ORAL
  Filled 2020-10-13 (×8): qty 2

## 2020-10-13 MED ORDER — ARIPIPRAZOLE 5 MG PO TABS
5.0000 mg | ORAL_TABLET | Freq: Every day | ORAL | Status: DC
  Administered 2020-10-14 – 2020-10-17 (×4): 5 mg via ORAL
  Filled 2020-10-13 (×4): qty 1

## 2020-10-13 MED ORDER — POLYETHYLENE GLYCOL 3350 17 G PO PACK
17.0000 g | PACK | Freq: Two times a day (BID) | ORAL | Status: DC
  Administered 2020-10-13 – 2020-10-14 (×4): 17 g via ORAL
  Filled 2020-10-13 (×5): qty 1

## 2020-10-13 MED ORDER — PANTOPRAZOLE SODIUM 40 MG PO TBEC
40.0000 mg | DELAYED_RELEASE_TABLET | Freq: Every day | ORAL | Status: DC
  Administered 2020-10-13 – 2020-10-17 (×5): 40 mg via ORAL
  Filled 2020-10-13 (×5): qty 1

## 2020-10-13 MED ORDER — ARIPIPRAZOLE 5 MG PO TABS: 5.0000 mg | ORAL_TABLET | Freq: Every day | ORAL | Status: DC

## 2020-10-13 MED ORDER — ENSURE ENLIVE PO LIQD
237.0000 mL | Freq: Two times a day (BID) | ORAL | Status: DC
  Administered 2020-10-13 – 2020-10-14 (×2): 237 mL via ORAL

## 2020-10-13 MED ORDER — UREA 15 G PO PACK
15.0000 g | PACK | Freq: Two times a day (BID) | ORAL | Status: DC
  Administered 2020-10-13 (×2): 15 g via ORAL
  Filled 2020-10-13 (×3): qty 1

## 2020-10-13 NOTE — Plan of Care (Signed)
  Problem: Elimination: Goal: Will not experience complications related to bowel motility Outcome: Progressing Goal: Will not experience complications related to urinary retention Outcome: Progressing   Problem: Safety: Goal: Ability to remain free from injury will improve Outcome: Progressing   Problem: Skin Integrity: Goal: Risk for impaired skin integrity will decrease Outcome: Progressing   

## 2020-10-13 NOTE — Discharge Instructions (Signed)
Nutrition Post Hospital Stay Proper nutrition can help your body recover from illness and injury.   Foods and beverages high in protein, vitamins, and minerals help rebuild muscle loss, promote healing, & reduce fall risk.   .In addition to eating healthy foods, a nutrition shake is an easy, delicious way to get the nutrition you need during and after your hospital stay  It is recommended that you continue to drink 2 bottles per day of:       Ensure for at least 1 month (30 days) after your hospital stay   Tips for adding a nutrition shake into your routine: As allowed, drink one with vitamins or medications instead of water or juice Enjoy one as a tasty mid-morning or afternoon snack Drink cold or make a milkshake out of it Drink one instead of milk with cereal or snacks Use as a coffee creamer   Available at the following grocery stores and pharmacies:           * [employer] * [employer] * [employer]co  * Rite Aid          * Walmart * [employer] Club  * Walgreens      * Target  * BJ's   * CVS  * [employer] Foods   * [facility] [phone]            For COUPONS visit: [email or web address]/join or [email or web address]   Suggested Substitutions Ensure Plus = Boost Plus = Carnation Breakfast Essentials = Boost Compact Ensure Active Clear = Boost Breeze Glucerna Shake = Boost Glucose Control = Carnation Breakfast Essentials SUGAR FREE       

## 2020-10-13 NOTE — Progress Notes (Signed)
PROGRESS NOTE  Karen Casey JOA:[id number] DOB: 1955-08-26 DOA: 10/12/2020 PCP: [name], MD  Brief History    65 year old woman had outpatient laboratory study with sodium of 120 and was referred to the emergency department by PCP.  Reported fatigue, lightheadedness.  Hyponatremia confirmed. A & P  Acute hyponatremia, known SIADH -- Reports drinking a lot of Gatorade at home, here drink a lot of water. --Reviewed nephrology notes.  Fluid restriction 1.5 L.  Start urea-Na and monitor sodium closely.  Gentle correction over the next 48 hours. --Asymptomatic except perhaps for lightheadedness.  Chest pain? -- Seems to be resolved.  Troponin negative, CT chest unrevealing, EKG nonacute no further evaluation suggested  Abd pain --resolved, benign exam, CT abd/pelvis unrevealing.  Alcohol abuse in early remission, PMH w/d seizures --monitor  Multiple new compression deformities since last imaging study in January. --Asymptomatic.  Follow-up as an outpatient.  PAF --in SR on admissioncontinue apixaban  Aortic Atherosclerosis (ICD10-I70.0) and Emphysema (ICD10-J43.9).  Hospitalized 3/20 9-09/2009 by teaching service.  Presented with altered mental status, agitation, found to be in alcohol and benzodiazepine withdrawal.  Had seizure requiring intubation and ICU admission.  Recommendation was continuing Xanax taper as an outpatient.  Hospitalized 4/14-4/17 for SIADH.  Normal saline infusion did not help.  Seen by nephrology, treated with tolvaptan and urea.  Duloxetine was discontinued on the discharge.  She was started on citalopram taper 30 mg with goal to decrease by 10 mg weekly.  Also reported chest pain treated with GI cocktail.  Duloxetine was discontinued on discharge. 7-day course of urea-na  Disposition Plan:  Discussion:   Status is: Inpatient  Remains inpatient appropriate because:Ongoing diagnostic testing needed not appropriate for outpatient work up and  Inpatient level of care appropriate due to severity of illness   Dispo: The patient is from: Home              Anticipated d/c is to: Home              Patient currently is not medically stable to d/c.   Difficult to place patient No  DVT prophylaxis: SCDs Start: 10/12/20 2106 apixaban (ELIQUIS) tablet 5 mg    Code Status: Full Code Level of care: Progressive Family Communication: none  Murray Hodgkins, MD  [facility] Direct contact: see [email or web address] (further directions at bottom of note if needed) 7PM-7AM contact night coverage as at bottom of note 10/13/2020, 6:24 PM  LOS: 1 day   Significant Hospital Events   . 5/1 admit for hyponatremia   Consults:  . None    Procedures:  . none  Significant Diagnostic Tests:  . CT chest no PE, multiple thoracic compression deformities . CT abd pelvis multiple thoracic compression deformities   Micro Data:  . COVID negative   Antimicrobials:  . None   Interval History/Subjective  CC: f/u low sodium  Feels lightheaded.  Otherwise okay.  No nausea or vomiting.  Tolerating diet.  Breathing okay.  Drinks a lot of Gatorade at home.  Thirsty.  "I have been drinking a lot of water".  Objective   Vitals:  Vitals:   10/13/20 0848 10/13/20 1130  BP: 130/72 119/76  Pulse:  87  Resp: 20 16  Temp:  98 F (36.7 C)  SpO2:  95%    Exam:  Constitutional:   . Appears calm and comfortable ENMT:  . grossly normal hearing  Respiratory:  . CTA bilaterally, no w/r/r.  . Respiratory effort normal. Cardiovascular:  .  RRR, no m/r/g . No LE extremity edema   Abdomen:  . Soft ntnd Musculoskeletal:  . Moves all extremities Neurologic:  . Grossly nonfocal Psychiatric:  . Mental status o Mood, affect appropriate  I have personally reviewed the following:   Today's Data  . Sodium 124, chloride 89, magnesium 1.5  Scheduled Meds: . ALPRAZolam  2 mg Oral BID  . apixaban  5 mg Oral BID  . [START ON 10/14/2020] ARIPiprazole  5  mg Oral Daily  . feeding supplement  237 mL Oral BID BM  . gabapentin  1,200 mg Oral TID  . metoprolol tartrate  12.5 mg Oral BID  . multivitamin with minerals  1 tablet Oral Daily  . nicotine  21 mg Transdermal Daily  . pantoprazole  40 mg Oral Daily  . polyethylene glycol  17 g Oral BID  . senna  1 tablet Oral QHS  . thiamine  100 mg Oral Daily  . urea  15 g Oral BID   Continuous Infusions:  Principal Problem:   Hyponatremia Active Problems:   GERD (gastroesophageal reflux disease)   Depression   Tobacco abuse   Alcohol use   HTN (hypertension)   Pulmonary nodules/lesions, multiple   Alcohol withdrawal seizure with delirium (HCC)   Paroxysmal atrial fibrillation (HCC)   Hypomagnesemia   SIADH (syndrome of inappropriate ADH production) ([place])   Hyperkalemia   LOS: 1 day   How to contact the [facility] Attending or Consulting provider 7A - 7P or covering provider during after hours [place], for this patient?  1. Check the care team in [facility] and look for a) attending/consulting [facility] provider listed and b) the [facility] team listed 2. Log into [email or web address] and use [place]'s universal password to access. If you do not have the password, please contact the hospital operator. 3. Locate the [facility] provider you are looking for under [facility] and page to a number that you can be directly reached. 4. If you still have difficulty reaching the provider, please page the [facility] (Director on Call) for the Hospitalists listed on amion for assistance.

## 2020-10-13 NOTE — Hospital Course (Addendum)
65 year old woman had outpatient laboratory study with sodium of 120 and was referred to the emergency department by PCP.  Reported fatigue, lightheadedness.  Hyponatremia confirmed.  Treated initially with fluids without improvement, started on urea sodium with some improvement.  To prevent overcorrection this medication held today.  Fluid restriction continues.  Plan repeat sodium in a.m.  A & P  Acute hyponatremia, known SIADH -- Somewhat improved.  Appears asymptomatic.  Initiated treatment as per nephrology recommendations last hospitalization.  Fluid restriction 1.5 L.  Gave urea-Na 5/2 with improvement, held today to prevent overcorrection.   -- Recheck BMP in AM.  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 --Asymptomatic.  Follow-up as an outpatient.  PAF --in SR on admissioncontinue apixaban  Aortic Atherosclerosis (ICD10-I70.0) and Emphysema (ICD10-J43.9).  Chest pain? -- Seems to be resolved.  Troponin negative, CT chest unrevealing, EKG nonacute no further evaluation suggested  Abd pain --resolved, benign exam, CT abd/pelvis unrevealing.  Hospitalized 3/20 9-09/2009 by teaching service.  Presented with altered mental status, agitation, found to be in alcohol and benzodiazepine withdrawal.  Had seizure requiring intubation and ICU admission.  Recommendation was continuing Xanax taper as an outpatient.  Hospitalized 4/14-4/17 for SIADH.  Normal saline infusion did not help.  Seen by nephrology, treated with tolvaptan and urea.  Duloxetine was discontinued on the discharge.  She was started on citalopram taper 30 mg with goal to decrease by 10 mg weekly.  Also reported chest pain treated with GI cocktail.  Duloxetine was discontinued on discharge. 7-day course of urea-na

## 2020-10-13 NOTE — Progress Notes (Signed)
Notified on call provider about patient's sodium level increasing from 124 to 130, and chloride level increasing from 89 to 92.

## 2020-10-13 NOTE — Progress Notes (Signed)
Initial Nutrition Assessment  DOCUMENTATION CODES:  Not applicable  INTERVENTION:  Continue current diet order.  Add Ensure Enlive po BID, each supplement provides 350 kcal and 20 grams of protein.  Add Magic cup TID with meals, each supplement provides 290 kcal and 9 grams of protein.  Add MVI with minerals daily.  NUTRITION DIAGNOSIS:  Severe Malnutrition related to acute illness as evidenced by energy intake < or equal to 50% for > or equal to 5 days,percent weight loss,mild muscle depletion.  GOAL:  Patient will meet greater than or equal to 90% of their needs  MONITOR:  PO intake,Supplement acceptance,Labs,Weight trends  REASON FOR ASSESSMENT:  Malnutrition Screening Tool    ASSESSMENT:  65 yo female with a PMH of PMH of EtOH abuse, depression, anxiety, tobacco abuse who presents with hyponatremia from output.  Spoke with pt at bedside. Pt did not like lunch, so RD brought her an Ensure Enlive. Pt reports eating poorly at home with no appetite. She tries to eat, but ends up only grazing at meals. Per Epic, pt ate 80% of breakfast this morning.   Per Epic, pt has lost 4% (8 lbs) of her body wt in a little less than 2 weeks, which is significant and severe.  On exam, pt had few depletions, but not many. Pt does qualify for severe acute malnutrition given weight loss and decreased intake.  Recommend adding Ensure BID and Magic Cup TID, as well as MVI with minerals daily. Consider liberalizing diet if PO intake declines.  Medications: Protonix, miralax BID, Senokot, thiamine 100 mg, Ure-Na BID Labs: reviewed; Na 121, Glucose 108-169 HbA1c: 5.2% (09/2020)  NUTRITION - FOCUSED PHYSICAL EXAM: Flowsheet Row Most Recent Value  Orbital Region No depletion  Upper Arm Region No depletion  Thoracic and Lumbar Region No depletion  Buccal Region Mild depletion  Temple Region No depletion  Clavicle Bone Region Mild depletion  Clavicle and Acromion Bone Region Mild depletion   Scapular Bone Region No depletion  Dorsal Hand No depletion  Patellar Region No depletion  Anterior Thigh Region Mild depletion  Posterior Calf Region Mild depletion  Edema (RD Assessment) None  Hair Reviewed  Eyes Reviewed  Mouth Reviewed  Skin Reviewed  Nails Reviewed     Diet Order:   Diet Order            Diet Heart Room service appropriate? Yes; Fluid consistency: Thin; Fluid restriction: 1500 mL Fluid  Diet effective now                EDUCATION NEEDS:  Education needs have been addressed  Skin:  Skin Assessment: Reviewed RN Assessment  Last BM:  10/10/20  Height:  Ht Readings from Last 1 Encounters:  10/12/20 5\' 6"  (1.676 m)   Weight:  Wt Readings from Last 1 Encounters:  10/12/20 85.1 kg   Ideal Body Weight:  59.1 kg  BMI:  Body mass index is 30.28 kg/m.  Estimated Nutritional Needs:  Kcal:  1700-1900 Protein:  85-100 grams Fluid:  1500 ml fluid restriction  Derrel Nip, RD, LDN Registered Dietitian After Hours/Weekend Pager # in [place]

## 2020-10-14 DIAGNOSIS — F1011 Alcohol abuse, in remission: Secondary | ICD-10-CM

## 2020-10-14 LAB — BASIC METABOLIC PANEL
Anion gap: 9 (ref 5–15)
BUN: 23 mg/dL (ref 8–23)
CO2: 29 mmol/L (ref 22–32)
Calcium: 9.5 mg/dL (ref 8.9–10.3)
Chloride: 90 mmol/L — ABNORMAL LOW (ref 98–111)
Creatinine, Ser: 0.58 mg/dL (ref 0.44–1.00)
GFR, Estimated: >60 mL/min (ref >60)
Glucose, Bld: 106 mg/dL — ABNORMAL HIGH (ref 70–99)
Potassium: 4.1 mmol/L (ref 3.5–5.1)
Sodium: 128 mmol/L — ABNORMAL LOW (ref 135–145)

## 2020-10-14 MED ORDER — ACETAMINOPHEN 325 MG PO TABS
650.0000 mg | ORAL_TABLET | Freq: Four times a day (QID) | ORAL | Status: DC | PRN
  Administered 2020-10-16 – 2020-10-17 (×3): 650 mg via ORAL
  Filled 2020-10-14 (×3): qty 2

## 2020-10-14 MED ORDER — MAGNESIUM HYDROXIDE 400 MG/5ML PO SUSP
15.0000 mL | Freq: Once | ORAL | Status: AC
  Administered 2020-10-14: 15 mL via ORAL
  Filled 2020-10-14: qty 30

## 2020-10-14 MED ORDER — KETOROLAC TROMETHAMINE 15 MG/ML IJ SOLN
15.0000 mg | Freq: Four times a day (QID) | INTRAMUSCULAR | Status: DC | PRN
  Administered 2020-10-14: 15 mg via INTRAVENOUS
  Filled 2020-10-14: qty 1

## 2020-10-14 NOTE — Plan of Care (Signed)
  Problem: Education: Goal: Knowledge of General Education information will improve Description: Including pain rating scale, medication(s)/side effects and non-pharmacologic comfort measures Outcome: Progressing   Problem: Coping: Goal: Level of anxiety will decrease Outcome: Progressing   Problem: Elimination: Goal: Will not experience complications related to urinary retention Outcome: Progressing   Problem: Pain Managment: Goal: General experience of comfort will improve Outcome: Progressing   Problem: Safety: Goal: Ability to remain free from injury will improve Outcome: Progressing   Problem: Skin Integrity: Goal: Risk for impaired skin integrity will decrease Outcome: Progressing   Problem: Coping: Goal: Ability to adjust to condition or change in health will improve Outcome: Progressing   Problem: Health Behavior/Discharge Planning: Goal: Compliance with prescribed medication regimen will improve Outcome: Progressing   Problem: Medication: Goal: Risk for medication side effects will decrease Outcome: Progressing   Problem: Safety: Goal: Verbalization of understanding the information provided will improve Outcome: Progressing   Problem: Self-Concept: Goal: Level of anxiety will decrease Outcome: Progressing

## 2020-10-14 NOTE — Progress Notes (Signed)
PROGRESS NOTE  ANGELICE PIECH NIO:[id number] DOB: January 25, 1956 DOA: 10/12/2020 PCP: [name], MD  Brief History    65 year old woman had outpatient laboratory study with sodium of 120 and was referred to the emergency department by PCP.  Reported fatigue, lightheadedness.  Hyponatremia confirmed.  Treated initially with fluids without improvement, started on urea sodium with some improvement.  To prevent overcorrection this medication held today.  Fluid restriction continues.  Plan repeat sodium in a.m.  A & P  Acute hyponatremia, known SIADH -- Somewhat improved.  Appears asymptomatic.  Initiated treatment as per nephrology recommendations last hospitalization.  Fluid restriction 1.5 L.  Gave urea-Na 5/2 with improvement, held today to prevent overcorrection.   -- Recheck BMP in AM.  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 --Asymptomatic.  Follow-up as an outpatient.  PAF --in SR on admissioncontinue apixaban  Aortic Atherosclerosis (ICD10-I70.0) and Emphysema (ICD10-J43.9).  Chest pain? -- Seems to be resolved.  Troponin negative, CT chest unrevealing, EKG nonacute no further evaluation suggested  Abd pain --resolved, benign exam, CT abd/pelvis unrevealing.  Hospitalized 3/20 9-09/2009 by teaching service.  Presented with altered mental status, agitation, found to be in alcohol and benzodiazepine withdrawal.  Had seizure requiring intubation and ICU admission.  Recommendation was continuing Xanax taper as an outpatient.  Hospitalized 4/14-4/17 for SIADH.  Normal saline infusion did not help.  Seen by nephrology, treated with tolvaptan and urea.  Duloxetine was discontinued on the discharge.  She was started on citalopram taper 30 mg with goal to decrease by 10 mg weekly.  Also reported chest pain treated with GI cocktail.  Duloxetine was discontinued on  discharge. 7-day course of urea-na  Disposition Plan:  Discussion:   Status is: Inpatient  Remains inpatient appropriate because:Ongoing diagnostic testing needed not appropriate for outpatient work up and Inpatient level of care appropriate due to severity of illness   Dispo: The patient is from: Home              Anticipated d/c is to: Home              Patient currently is not medically stable to d/c.   Difficult to place patient No  DVT prophylaxis: SCDs Start: 10/12/20 2106 apixaban (ELIQUIS) tablet 5 mg    Code Status: Full Code Level of care: Progressive Family Communication: none  Murray Hodgkins, MD  [facility] Direct contact: see [email or web address] (further directions at bottom of note if needed) 7PM-7AM contact night coverage as at bottom of note 10/14/2020, 3:24 PM  LOS: 2 days   Significant Hospital Events   . 5/1 admit for hyponatremia   Consults:  . None    Procedures:  . none  Significant Diagnostic Tests:  . CT chest no PE, multiple thoracic compression deformities . CT abd pelvis multiple thoracic compression deformities   Micro Data:  . COVID negative   Antimicrobials:  . None   Interval History/Subjective  CC: f/u low sodium  Feels okay today.  Has some chest discomfort earlier.  Tolerating diet.  Objective   Vitals:  Vitals:   10/14/20 0602 10/14/20 1230  BP:  98/81  Pulse:  88  Resp:  20  Temp:  97.9 F (36.6 C)  SpO2: 94% 96%    Exam:  Constitutional:   . Appears calm and comfortable Respiratory:  . CTA bilaterally, no w/r/r.  . Respiratory effort normal.  Cardiovascular:  . RRR, no m/r/g . No LE extremity edema   Psychiatric:  . Mental status o Mood, affect appropriate  I have personally reviewed the following:   Today's Data  . Sodium labile 124, 121, 124, 130, 128  Scheduled Meds: . ALPRAZolam  2 mg Oral BID  . apixaban  5 mg Oral BID  . ARIPiprazole  5 mg Oral Daily  . feeding supplement  237 mL Oral BID BM   . gabapentin  1,200 mg Oral TID  . metoprolol tartrate  12.5 mg Oral BID  . multivitamin with minerals  1 tablet Oral Daily  . nicotine  21 mg Transdermal Daily  . pantoprazole  40 mg Oral Daily  . polyethylene glycol  17 g Oral BID  . senna  1 tablet Oral QHS  . thiamine  100 mg Oral Daily   Continuous Infusions:  Principal Problem:   Hyponatremia Active Problems:   GERD (gastroesophageal reflux disease)   Depression   Tobacco abuse   Alcohol use   HTN (hypertension)   Pulmonary nodules/lesions, multiple   Alcohol withdrawal seizure with delirium (HCC)   Paroxysmal atrial fibrillation (HCC)   Hypomagnesemia   SIADH (syndrome of inappropriate ADH production) ([place])   Hyperkalemia   LOS: 2 days   How to contact the [facility] Attending or Consulting provider [place] or covering provider during after hours [place], for this patient?  1. Check the care team in [facility] and look for a) attending/consulting TRH provider listed and b) the [facility] team listed 2. Log into [email or web address] and use [facility]'s universal password to access. If you do not have the password, please contact the hospital operator. 3. Locate the [facility] provider you are looking for under [facility] and page to a number that you can be directly reached. 4. If you still have difficulty reaching the provider, please page the [facility] (Director on Call) for the Hospitalists listed on amion for assistance.

## 2020-10-14 NOTE — Progress Notes (Addendum)
Notified on call provider about patient having pain 6/10 in upper, mid back due to patient not having anything PRN for pain. Applied heat pack while awaiting any new orders.

## 2020-10-15 ENCOUNTER — Telehealth: Payer: 59

## 2020-10-15 LAB — GLUCOSE, CAPILLARY: Glucose-Capillary: 122 mg/dL — ABNORMAL HIGH (ref 70–99)

## 2020-10-15 LAB — BASIC METABOLIC PANEL
Anion gap: 12 (ref 5–15)
BUN: 10 mg/dL (ref 8–23)
CO2: 27 mmol/L (ref 22–32)
Calcium: 9.6 mg/dL (ref 8.9–10.3)
Chloride: 90 mmol/L — ABNORMAL LOW (ref 98–111)
Creatinine, Ser: 0.43 mg/dL — ABNORMAL LOW (ref 0.44–1.00)
GFR, Estimated: >60 mL/min (ref >60)
Glucose, Bld: 110 mg/dL — ABNORMAL HIGH (ref 70–99)
Potassium: 4.2 mmol/L (ref 3.5–5.1)
Sodium: 129 mmol/L — ABNORMAL LOW (ref 135–145)

## 2020-10-15 MED ORDER — LORAZEPAM 2 MG/ML IJ SOLN: 2.0000 mg | INTRAMUSCULAR | Status: DC | PRN

## 2020-10-15 MED ORDER — UREA 15 G PO PACK
15.0000 g | PACK | Freq: Two times a day (BID) | ORAL | Status: DC
  Administered 2020-10-15 – 2020-10-17 (×5): 15 g via ORAL
  Filled 2020-10-15 (×6): qty 1

## 2020-10-15 NOTE — Evaluation (Signed)
Physical Therapy Evaluation Patient Details Name: Karen Casey MRN: [id number] DOB: 25-Nov-1955 Today's Date: 10/15/2020   History of Present Illness  65 yo female admitted wit hyponatremia, chest pain. CT (+) T5, T10-T12 compression deformities. Hx of ETOH abuse, ETOH WD, Sz, SIADH, chronic back pain  Clinical Impression  On eval, pt was Min guard assist for mobility. She walked ~150 feet around the unit with a RW. No LOB with RW use. O2 93% on RA, dyspnea 2/4. Pt tolerated activity well. Discussed d/c plan -pt plans to return home where she lives with her husband. Will continue to follow and progress activity as tolerated.     Follow Up Recommendations Supervision for mobility/OOB (HHPT if pt is agreeable)    Equipment Recommendations  None recommended by PT    Recommendations for Other Services       Precautions / Restrictions Precautions Precautions: Fall Restrictions Weight Bearing Restrictions: No      Mobility  Bed Mobility Overal bed mobility: Modified Independent                  Transfers Overall transfer level: Needs assistance Equipment used: Rolling walker (2 wheeled) Transfers: Sit to/from Stand Sit to Stand: Supervision            Ambulation/Gait Ambulation/Gait assistance: Min guard Gait Distance (Feet): 150 Feet Assistive device: Rolling walker (2 wheeled) Gait Pattern/deviations: Step-through pattern;Decreased stride length     General Gait Details: Min guard for safety. No LOB with RW use. O2 93% on RA. Pt reported some lightheadedness but she tolerated distance well.  Stairs            Wheelchair Mobility    Modified Rankin (Stroke Patients Only)       Balance Overall balance assessment: Needs assistance;History of Falls           Standing balance-Leahy Scale: Fair                               Pertinent Vitals/Pain Pain Assessment: 0-10 Pain Score: 5  Pain Location: back-chronic Pain Descriptors  / Indicators: Discomfort;Sore;Aching Pain Intervention(s): Limited activity within patient's tolerance;Monitored during session;Repositioned    Home Living Family/patient expects to be discharged to:: Private residence Living Arrangements: Spouse/significant other Available Help at Discharge: Family;Available 24 hours/day Type of Home: House Home Access: Level entry     Home Layout: One level Home Equipment: Walker - 2 wheels;Bedside commode;Other (comment)      Prior Function Level of Independence: Independent with assistive device(s)         Comments: was on RW     Hand Dominance        Extremity/Trunk Assessment   Upper Extremity Assessment Upper Extremity Assessment: Overall WFL for tasks assessed    Lower Extremity Assessment Lower Extremity Assessment: Generalized weakness    Cervical / Trunk Assessment Cervical / Trunk Assessment: Normal  Communication   Communication: No difficulties  Cognition Arousal/Alertness: Awake/alert Behavior During Therapy: WFL for tasks assessed/performed Overall Cognitive Status: Within Functional Limits for tasks assessed                                        General Comments      Exercises     Assessment/Plan    PT Assessment Patient needs continued PT services  PT Problem List Decreased strength;Decreased mobility;Decreased  activity tolerance;Decreased balance;Decreased knowledge of use of DME;Pain       PT Treatment Interventions DME instruction;Gait training;Therapeutic exercise;Balance training;Functional mobility training;Therapeutic activities;Patient/family education    PT Goals (Current goals can be found in the Care Plan section)  Acute Rehab PT Goals Patient Stated Goal: to get better and get home PT Goal Formulation: With patient Time For Goal Achievement: 10/29/20 Potential to Achieve Goals: Good    Frequency Min 3X/week   Barriers to discharge        Co-evaluation                AM-PAC PT "6 Clicks" Mobility  Outcome Measure Help needed turning from your back to your side while in a flat bed without using bedrails?: None Help needed moving from lying on your back to sitting on the side of a flat bed without using bedrails?: None Help needed moving to and from a bed to a chair (including a wheelchair)?: A Little Help needed standing up from a chair using your arms (e.g., wheelchair or bedside chair)?: A Little Help needed to walk in hospital room?: A Little Help needed climbing 3-5 steps with a railing? : A Little 6 Click Score: 20    End of Session Equipment Utilized During Treatment: Gait belt Activity Tolerance: Patient tolerated treatment well Patient left: in chair;with call bell/phone within reach;with chair alarm set   PT Visit Diagnosis: Unsteadiness on feet (R26.81);Muscle weakness (generalized) (M62.81)    Time: 1937-9024 PT Time Calculation (min) (ACUTE ONLY): 15 min   Charges:   PT Evaluation $PT Eval Moderate Complexity: 1 Mod            Doreatha Massed, PT Acute Rehabilitation  Office: [phone] Pager: [phone]

## 2020-10-15 NOTE — Progress Notes (Signed)
Pt seen on bed with seizure like activity. Pt had significant facial twitching with no jerky movements for a little over 2 minutes. Pt was hard to arouse and Oxygen dropped to 84%.This RN placed pt to 4L of O2 during the episode and continue with 2L. After the episode pt was totally disoriented but was re-oriented.NP Randol Kern made aware. Will continue to monitor

## 2020-10-15 NOTE — Plan of Care (Signed)
  Problem: Clinical Measurements: Goal: Diagnostic test results will improve Outcome: Progressing Goal: Respiratory complications will improve Outcome: Progressing   Problem: Activity: Goal: Risk for activity intolerance will decrease Outcome: Progressing   Problem: Nutrition: Goal: Adequate nutrition will be maintained Outcome: Progressing   Problem: Coping: Goal: Level of anxiety will decrease Outcome: Progressing   

## 2020-10-15 NOTE — Consult Note (Signed)
   [facility] CM Inpatient Consult   10/15/2020  Karen Casey 1955/10/25 [id number]   Patient chart has been reviewed due to unplanned readmission less than 30 days. Patient assessed for community [place] Management follow up needs.  Chart review reveals patient currently being followed for outpatient chronic care management services through primary physician office with RN Case manager.  Plan: Will continue to follow for progression and disposition and update embedded RN case manager.  Of note, [facility] Care Management services does not replace or interfere with any services that are arranged by inpatient case management or social work.  [name], MSN, [place] [facility] Liaison Nurse Mobile Phone [phone]  Toll free office [phone]

## 2020-10-15 NOTE — Progress Notes (Signed)
[facility] Progress Note  Patient: Karen Casey    VHQ:[id number]  DOA: 10/12/2020     Date of Service: the patient was seen and examined on 10/15/2020  Brief hospital course: Past medical history of SIADH, alcohol abuse, chronic A. fib, alcohol withdrawal seizures, tobacco abuse, mood disorder.  Presents with complaints of fatigue.  Found to have severe hyponatremia. Currently plan is correct sodium and monitor response to.  Assessment and Plan: 1.  SIADH. Recurrent hyponatremia Sodium level gradually improving. Diagnosed with SIADH in the past. Currently will continue fluid restriction.  Change diet to regular diet. Adding urea tablets again. Monitor daily BMP. Reportedly drinking a lot of Gatorade. Unable to confirm medications with husband. Gabapentin is associated with hyponatremia. Abilify is association with xerostomia as well as hyponatremia. Seroquel is associated with SIADH. Omeprazole associated with hyponatremia as well. Unable to confirm medication with patient or her husband.  2.  History of alcohol abuse. Currently no evidence of withdrawal.  Monitor. Has history of seizures.  Monitor.  3.  Compression fracture Multiple seen on CT scan. Currently does not have any pain. Likely associated with hyponatremia as well as polypharmacy. PT OT consulted.  4.  Paroxysmal A. fib Currently sinus rhythm. On anticoagulation.  At risk for bleeding secondary to potential recurrent falls at home. Monitor.  5.  Abdominal pain, chest pain. CT chest abdomen pelvis negative for any acute abnormality. Also negative for any malignancy as a result of her hyponatremia.  6.  Severe malnutrition Obesity Placing the patient at high risk for poor outcome. Currently on Ensure. Body mass index is 30.28 kg/m.  Nutrition Problem: Severe Malnutrition Etiology: acute illness Interventions: Interventions: Ensure Enlive (each supplement provides 350kcal and 20 grams of  protein),Magic cup,MVI      Diet: Regular diet DVT Prophylaxis:   SCDs Start: 10/12/20 2106 apixaban (ELIQUIS) tablet 5 mg    Advance goals of care discussion: Full code  Family Communication: no family was present at bedside, at the time of interview.  The pt provided permission to discuss medical plan with the family. Opportunity was given to ask question and all questions were answered satisfactorily.   Disposition:  Status is: Inpatient  Remains inpatient appropriate because:IV treatments appropriate due to intensity of illness or inability to take PO   Dispo: The patient is from: Home              Anticipated d/c is to: to be determined              Patient currently is not medically stable to d/c.   Difficult to place patient No  Subjective: Not feeling good.  No nausea no vomiting but no fever no chills.  Reports fatigue and tiredness.  No headache.  No focal deficit.  Physical Exam:  General: Appear in mild distress, no Rash; Oral Mucosa Clear, dry. no Abnormal Neck Mass Or lumps, Conjunctiva normal  Cardiovascular: S1 and S2 Present, no Murmur, Respiratory: good respiratory effort, Bilateral Air entry present and CTA, no Crackles, no wheezes Abdomen: Bowel Sound present, Soft and no tenderness Extremities: no Pedal edema Neurology: alert and oriented to time, place, and person affect anxious. no new focal deficit Gait not checked due to patient safety concerns  Vitals:   10/14/20 2009 10/15/20 0515 10/15/20 1407 10/15/20 2038  BP: 120/67 117/76 (!) 114/59 119/70  Pulse: 92 97 (!) 102 99  Resp: 18 18 18 18   Temp: (!) 97.5 F (36.4 C) 98.5 F (36.9 C)  97.8 F (36.6 C) 97.9 F (36.6 C)  TempSrc: Oral Oral Oral Oral  SpO2: 96% 95% 96% 96%  Weight:      Height:        Intake/Output Summary (Last 24 hours) at 10/15/2020 2046 Last data filed at 10/15/2020 2042 Gross per 24 hour  Intake 720 ml  Output 650 ml  Net 70 ml   Filed Weights   10/12/20 1811   Weight: 85.1 kg    Data Reviewed: I have personally reviewed and interpreted daily labs, tele strips, imaging. I reviewed all nursing notes, pharmacy notes, vitals, pertinent old records I have discussed plan of care as described above with RN and patient/family.  CBC: Recent Labs  Lab 10/12/20 1747  WBC 8.1  HGB 15.2*  HCT 43.3  MCV 91.2  PLT 638   Basic Metabolic Panel: Recent Labs  Lab 10/13/20 0401 10/13/20 0836 10/13/20 1155 10/13/20 1536 10/13/20 1947 10/14/20 0333 10/15/20 0326  NA 128*   < > 121* 124* 130* 128* 129*  K  --    < > 4.0 4.0 4.5 4.1 4.2  CL  --    < > 87* 89* 92* 90* 90*  CO2  --    < > 25 26 28 29 27   GLUCOSE  --    < > 108* 123* 115* 106* 110*  BUN  --    < > 17 18 18 23 10   CREATININE  --    < > 0.49 0.53 0.47 0.58 0.43*  CALCIUM  --    < > 9.1 9.0 9.5 9.5 9.6  MG 1.5*  --   --   --   --   --   --    < > = values in this interval not displayed.    Studies: No results found.  Scheduled Meds: . ALPRAZolam  2 mg Oral BID  . apixaban  5 mg Oral BID  . ARIPiprazole  5 mg Oral Daily  . feeding supplement  237 mL Oral BID BM  . gabapentin  1,200 mg Oral TID  . metoprolol tartrate  12.5 mg Oral BID  . multivitamin with minerals  1 tablet Oral Daily  . nicotine  21 mg Transdermal Daily  . pantoprazole  40 mg Oral Daily  . thiamine  100 mg Oral Daily  . urea  15 g Oral BID   Continuous Infusions: PRN Meds: acetaminophen, bisacodyl, ondansetron **OR** ondansetron (ZOFRAN) IV  Time spent: 35 minutes  Author: Berle Mull, MD [facility] 10/15/2020 8:46 PM  To reach On-call, see care teams to locate the attending and reach out via [email or web address]. Between 7PM-7AM, please contact night-coverage If you still have difficulty reaching the attending provider, please page the [facility] (Director on Call) for [facility] on amion for assistance.

## 2020-10-16 ENCOUNTER — Inpatient Hospital Stay (HOSPITAL_COMMUNITY): Payer: 59

## 2020-10-16 LAB — BASIC METABOLIC PANEL
Anion gap: 12 (ref 5–15)
Anion gap: 10 (ref 5–15)
BUN: 18 mg/dL (ref 8–23)
BUN: 22 mg/dL (ref 8–23)
CO2: 28 mmol/L (ref 22–32)
CO2: 29 mmol/L (ref 22–32)
Calcium: 9.7 mg/dL (ref 8.9–10.3)
Calcium: 9.5 mg/dL (ref 8.9–10.3)
Chloride: 89 mmol/L — ABNORMAL LOW (ref 98–111)
Chloride: 89 mmol/L — ABNORMAL LOW (ref 98–111)
Creatinine, Ser: 0.37 mg/dL — ABNORMAL LOW (ref 0.44–1.00)
Creatinine, Ser: 0.45 mg/dL (ref 0.44–1.00)
GFR, Estimated: >60 mL/min (ref >60)
GFR, Estimated: >60 mL/min (ref >60)
Glucose, Bld: 112 mg/dL — ABNORMAL HIGH (ref 70–99)
Glucose, Bld: 112 mg/dL — ABNORMAL HIGH (ref 70–99)
Potassium: 4.7 mmol/L (ref 3.5–5.1)
Potassium: 4.4 mmol/L (ref 3.5–5.1)
Sodium: 129 mmol/L — ABNORMAL LOW (ref 135–145)
Sodium: 128 mmol/L — ABNORMAL LOW (ref 135–145)

## 2020-10-16 LAB — CBC WITH DIFFERENTIAL/PLATELET
Abs Immature Granulocytes: 0.02 10*3/uL (ref 0.00–0.07)
Basophils Absolute: 0 10*3/uL (ref 0.0–0.1)
Basophils Relative: 0 %
Eosinophils Absolute: 0.2 10*3/uL (ref 0.0–0.5)
Eosinophils Relative: 3 %
HCT: 39.7 % (ref 36.0–46.0)
Hemoglobin: 13.4 g/dL (ref 12.0–15.0)
Immature Granulocytes: 0 %
Lymphocytes Relative: 15 %
Lymphs Abs: 1 10*3/uL (ref 0.7–4.0)
MCH: 31.7 pg (ref 26.0–34.0)
MCHC: 33.8 g/dL (ref 30.0–36.0)
MCV: 93.9 fL (ref 80.0–100.0)
Monocytes Absolute: 0.5 10*3/uL (ref 0.1–1.0)
Monocytes Relative: 7 %
Neutro Abs: 5.1 10*3/uL (ref 1.7–7.7)
Neutrophils Relative %: 75 %
Platelets: 136 10*3/uL — ABNORMAL LOW (ref 150–400)
RBC: 4.23 MIL/uL (ref 3.87–5.11)
RDW: 12 % (ref 11.5–15.5)
WBC: 6.9 10*3/uL (ref 4.0–10.5)
nRBC: 0 % (ref 0.0–0.2)

## 2020-10-16 LAB — MAGNESIUM: Magnesium: 1.6 mg/dL — ABNORMAL LOW (ref 1.7–2.4)

## 2020-10-16 MED ORDER — GABAPENTIN 100 MG PO CAPS: 100.0000 mg | ORAL_CAPSULE | Freq: Two times a day (BID) | ORAL | Status: DC

## 2020-10-16 MED ORDER — UMECLIDINIUM BROMIDE 62.5 MCG/INH IN AEPB
1.0000 | INHALATION_SPRAY | Freq: Every day | RESPIRATORY_TRACT | Status: DC
  Administered 2020-10-17: 1 via RESPIRATORY_TRACT
  Filled 2020-10-16: qty 7

## 2020-10-16 MED ORDER — GABAPENTIN 600 MG PO TABS
300.0000 mg | ORAL_TABLET | Freq: Once | ORAL | Status: DC
  Filled 2020-10-16: qty 0.5

## 2020-10-16 MED ORDER — MOMETASONE FURO-FORMOTEROL FUM 100-5 MCG/ACT IN AERO
2.0000 | INHALATION_SPRAY | Freq: Two times a day (BID) | RESPIRATORY_TRACT | Status: DC
  Administered 2020-10-16 – 2020-10-17 (×2): 2 via RESPIRATORY_TRACT
  Filled 2020-10-16: qty 8.8

## 2020-10-16 MED ORDER — SALINE SPRAY 0.65 % NA SOLN
1.0000 | NASAL | Status: DC | PRN
  Filled 2020-10-16: qty 44

## 2020-10-16 MED ORDER — ALUM & MAG HYDROXIDE-SIMETH 200-200-20 MG/5ML PO SUSP
15.0000 mL | Freq: Once | ORAL | Status: AC
  Administered 2020-10-16: 15 mL via ORAL
  Filled 2020-10-16: qty 30

## 2020-10-16 MED ORDER — GUAIFENESIN ER 600 MG PO TB12
600.0000 mg | ORAL_TABLET | Freq: Two times a day (BID) | ORAL | Status: DC
  Administered 2020-10-16 – 2020-10-17 (×3): 600 mg via ORAL
  Filled 2020-10-16 (×3): qty 1

## 2020-10-16 MED ORDER — GABAPENTIN 300 MG PO CAPS
300.0000 mg | ORAL_CAPSULE | Freq: Once | ORAL | Status: AC
  Administered 2020-10-16: 300 mg via ORAL
  Filled 2020-10-16: qty 1

## 2020-10-16 MED ORDER — ALBUTEROL SULFATE HFA 108 (90 BASE) MCG/ACT IN AERS: 2.0000 | INHALATION_SPRAY | RESPIRATORY_TRACT | Status: DC | PRN

## 2020-10-16 MED ORDER — GABAPENTIN 300 MG PO CAPS
300.0000 mg | ORAL_CAPSULE | Freq: Two times a day (BID) | ORAL | Status: DC
  Administered 2020-10-16: 300 mg via ORAL
  Filled 2020-10-16: qty 1

## 2020-10-16 MED ORDER — GABAPENTIN 300 MG PO CAPS
600.0000 mg | ORAL_CAPSULE | Freq: Three times a day (TID) | ORAL | Status: DC
  Administered 2020-10-16 – 2020-10-17 (×3): 600 mg via ORAL
  Filled 2020-10-16 (×3): qty 2

## 2020-10-16 MED ORDER — MAGNESIUM SULFATE 2 GM/50ML IV SOLN
2.0000 g | Freq: Once | INTRAVENOUS | Status: AC
  Administered 2020-10-16: 2 g via INTRAVENOUS
  Filled 2020-10-16: qty 50

## 2020-10-16 NOTE — Progress Notes (Signed)
Pt O2 saturation dropped to 65% while asleep. RN placed pt on 2L of O2 at bedtime but pt removed it d/t nasal irritation. RN place pt back to 2L with humidifier. NP Randol Kern made aware. Will continue to monitor

## 2020-10-16 NOTE — Progress Notes (Signed)
Physical Therapy Treatment Patient Details Name: Karen Casey MRN: [id number] DOB: 1956/04/15 Today's Date: 10/16/2020    History of Present Illness 65 yo female admitted wit hyponatremia, chest pain. CT (+) T5, T10-T12 compression deformities. Hx of ETOH abuse, ETOH WD, Sz, SIADH, chronic back pain    PT Comments    Pt declines ambulation, states she already walked with OT, but agreeable to BLE strengthening. Cued pt through exercises, cued for decreased momentum and increased motor control. Verbal cues for hand placement with STS reps with good carryover. Educated pt on benefit of OOB mobility, ambulation and spending time in recliner but pt declines both due to ambulating earlier and back pain currently. Discussed HHPT vs OPPT, pt with multiple questions answered, ultimately in agreement with OPPT for rehab. Continue to progress as able.   Follow Up Recommendations  Supervision for mobility/OOB;Outpatient PT (declines HHPT, wanting to go to OPPT)     Equipment Recommendations  None recommended by PT    Recommendations for Other Services       Precautions / Restrictions Precautions Precautions: Fall Restrictions Weight Bearing Restrictions: No    Mobility  Bed Mobility Overal bed mobility: Modified Independent  General bed mobility comments: increased time supine<>sit    Transfers Overall transfer level: Needs assistance Equipment used: Rolling walker (2 wheeled) Transfers: Sit to/from Stand Sit to Stand: Supervision  General transfer comment: VCs for hand placement, UEs assist to power to stand  Ambulation/Gait  General Gait Details: pt declines   Stairs             Wheelchair Mobility    Modified Rankin (Stroke Patients Only)       Balance Overall balance assessment: Needs assistance;History of Falls  Standing balance-Leahy Scale: Fair Standing balance comment: static without UE support       Cognition Arousal/Alertness:  Awake/alert Behavior During Therapy: WFL for tasks assessed/performed Overall Cognitive Status: Within Functional Limits for tasks assessed       Exercises General Exercises - Lower Extremity Long Arc Quad: Seated;AROM;Strengthening;Both;20 reps Hip Flexion/Marching: Seated;AROM;Strengthening;Both;20 reps Toe Raises: Standing;AROM;Strengthening;Right;20 reps Other Exercises Other Exercises: STS, 5 reps with RW    General Comments        Pertinent Vitals/Pain Pain Assessment: 0-10 Pain Score: 5  Faces Pain Scale: No hurt Pain Location: back Pain Descriptors / Indicators: Other (Comment);Sore;Aching (chronic) Pain Intervention(s): Limited activity within patient's tolerance;Monitored during session;Repositioned    Home Living Family/patient expects to be discharged to:: Private residence Living Arrangements: Spouse/significant other Available Help at Discharge: Family;Available 24 hours/day Type of Home: Apartment Home Access: Level entry   Home Layout: One level Home Equipment: Walker - 2 wheels;Bedside commode;Other (comment)      Prior Function Level of Independence: Independent with assistive device(s)      Comments: was on RW   PT Goals (current goals can now be found in the care plan section) Acute Rehab PT Goals Patient Stated Goal: to get better and get home PT Goal Formulation: With patient Time For Goal Achievement: 10/29/20 Potential to Achieve Goals: Good Progress towards PT goals: Progressing toward goals    Frequency    Min 3X/week      PT Plan Discharge plan needs to be updated    Co-evaluation              AM-PAC PT "6 Clicks" Mobility   Outcome Measure  Help needed turning from your back to your side while in a flat bed without using bedrails?: None Help needed  moving from lying on your back to sitting on the side of a flat bed without using bedrails?: None Help needed moving to and from a bed to a chair (including a wheelchair)?:  A Little Help needed standing up from a chair using your arms (e.g., wheelchair or bedside chair)?: A Little Help needed to walk in hospital room?: A Little Help needed climbing 3-5 steps with a railing? : A Little 6 Click Score: 20    End of Session Equipment Utilized During Treatment: Gait belt Activity Tolerance: Patient tolerated treatment well Patient left: in bed;with call bell/phone within reach;with bed alarm set;with nursing/sitter in room Nurse Communication: Mobility status PT Visit Diagnosis: Unsteadiness on feet (R26.81);Muscle weakness (generalized) (M62.81)     Time: 4765-4650 PT Time Calculation (min) (ACUTE ONLY): 14 min  Charges:  $Therapeutic Exercise: 8-22 mins                      Tori Jonavin Seder PT, DPT 10/16/20, 3:24 PM

## 2020-10-16 NOTE — Plan of Care (Signed)
  Problem: Clinical Measurements: Goal: Ability to maintain clinical measurements within normal limits will improve Outcome: Progressing Goal: Diagnostic test results will improve Outcome: Progressing   Problem: Activity: Goal: Risk for activity intolerance will decrease Outcome: Progressing   Problem: Nutrition: Goal: Adequate nutrition will be maintained Outcome: Progressing   Problem: Coping: Goal: Level of anxiety will decrease Outcome: Progressing   Problem: Elimination: Goal: Will not experience complications related to bowel motility Outcome: Progressing

## 2020-10-16 NOTE — Progress Notes (Signed)
OT Cancellation Note  Patient Details Name: Karen Casey MRN: [id number] DOB: June 21, 1955   Cancelled Treatment:    Reason Eval/Treat Not Completed: Patient declined, no reason specified. Patient reports eventful night unsure if she had seizure vs sleep apnea episode per DTR. Will check back as schedule permits.  Delbert Phenix OT OT pager: [place] 10/16/2020, 9:55 AM

## 2020-10-16 NOTE — Progress Notes (Signed)
Overnight pulse oximeter study initiated at this time. Patient is on room air.

## 2020-10-16 NOTE — Evaluation (Signed)
Occupational Therapy Evaluation Patient Details Name: Karen Casey MRN: [id number] DOB: 03/07/1956 Today's Date: 10/16/2020    History of Present Illness 65 yo female admitted wit hyponatremia, chest pain. CT (+) T5, T10-T12 compression deformities. Hx of ETOH abuse, ETOH WD, Sz, SIADH, chronic back pain   Clinical Impression   Patient lives with spouse in an apartment that is single level no steps to enter. Patient reports has been using walker since prior hospitalization, independent with self care tasks. Currently patient did not require any physical assistance for toilet transfer, sink side grooming/hygiene, or functional ambulation with walker. Patient reports she should be discharging home tomorrow, no concerns for OT at this time. Does state she needs OP PT vs home health "I've got a tiny apartment with no where to walk around with home health." No further acute OT needs at this time, will sign off.    Follow Up Recommendations  No OT follow up    Equipment Recommendations  None recommended by OT       Precautions / Restrictions Precautions Precautions: Fall Restrictions Weight Bearing Restrictions: No      Mobility Bed Mobility Overal bed mobility: Modified Independent                  Transfers Overall transfer level: Modified independent Equipment used: Rolling walker (2 wheeled)             General transfer comment: no physical assistance to power up to standing    Balance Overall balance assessment: History of Falls;Mild deficits observed, not formally tested                                         ADL either performed or assessed with clinical judgement   ADL Overall ADL's : At baseline                                       General ADL Comments: patient performed functional transfers, ambulation with walker, toilet transfer and hand hygiene without physical assistance                  Pertinent  Vitals/Pain Pain Assessment: Faces Faces Pain Scale: No hurt     Hand Dominance Right   Extremity/Trunk Assessment Upper Extremity Assessment Upper Extremity Assessment: Overall WFL for tasks assessed   Lower Extremity Assessment Lower Extremity Assessment: Defer to PT evaluation       Communication Communication Communication: No difficulties   Cognition Arousal/Alertness: Awake/alert Behavior During Therapy: WFL for tasks assessed/performed Overall Cognitive Status: Within Functional Limits for tasks assessed                                 General Comments: following directions appropriately              Home Living Family/patient expects to be discharged to:: Private residence Living Arrangements: Spouse/significant other Available Help at Discharge: Family;Available 24 hours/day Type of Home: Apartment Home Access: Level entry     Home Layout: One level     Bathroom Shower/Tub: Teacher, early years/pre: Standard     Home Equipment: Environmental consultant - 2 wheels;Bedside commode;Other (comment)          Prior Functioning/Environment Level of  Independence: Independent with assistive device(s)        Comments: was on RW        OT Problem List: Decreased activity tolerance         OT Goals(Current goals can be found in the care plan section) Acute Rehab OT Goals Patient Stated Goal: home tomorrow OT Goal Formulation: All assessment and education complete, DC therapy   AM-PAC OT "6 Clicks" Daily Activity     Outcome Measure Help from another person eating meals?: None Help from another person taking care of personal grooming?: None Help from another person toileting, which includes using toliet, bedpan, or urinal?: None Help from another person bathing (including washing, rinsing, drying)?: None Help from another person to put on and taking off regular upper body clothing?: None Help from another person to put on and taking off  regular lower body clothing?: None 6 Click Score: 24   End of Session Equipment Utilized During Treatment: Rolling walker Nurse Communication: Mobility status  Activity Tolerance: Patient tolerated treatment well Patient left: in chair;with call bell/phone within reach;with chair alarm set  OT Visit Diagnosis: History of falling (Z91.81)                Time: 4854-6270 OT Time Calculation (min): 25 min Charges:  OT General Charges $OT Visit: 1 Visit OT Evaluation $OT Eval Low Complexity: [place] OT OT pager: [place] 10/16/2020, 2:29 PM

## 2020-10-16 NOTE — Progress Notes (Signed)
[employer] New onset hypoxia requiring 2 L supplemental nasal cannula oxygen to get sats above 90 that started with seizure like activity  2 minute duration. Patient unable to wean off as evident by sats in 60's with oxygen dsplaced while sleeping.  No evidence of increased wor of breathing  Chest xray, CBC and chem panel ordered for this am to further investigate

## 2020-10-16 NOTE — Progress Notes (Deleted)
Primary Physician/Referring:  [name], MD  Patient ID: Karen Casey, female    DOB: 10-25-1955, 65 y.o.   MRN: [id number]  No chief complaint on file.  HPI:    ANGINETTE Casey  is a 65 y.o. ***  Past Medical History:  Diagnosis Date  . Alcohol abuse   . Allergy   . Anxiety   . Cataract 06/09/2012   Right eye and left eye  . Depression   . GERD (gastroesophageal reflux disease)   . 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 Seizures ([place])    xanax withdrawl- December 2013  . Urinary incontinence 06/09/2012   Past Surgical History:  Procedure Laterality Date  . APPENDECTOMY    . CATARACT EXTRACTION  06/09/2012   Left eye  . left shoulder dislocation  Sept 2011   Family History  Problem Relation Age of Onset  . Hypertension Mother   . Hyperlipidemia Mother   . Aneurysm Mother        Rupture - Cause of death  . Heart disease Father        MI - cause of death  . Depression Father   . Parkinsonism Father   . Colon cancer Neg Hx   . Esophageal cancer Neg Hx   . Rectal cancer Neg Hx   . Stomach cancer Neg Hx     Social History   Tobacco Use  . Smoking status: Current Every Day Smoker    Packs/day: 1.00    Years: 43.00    Pack years: 43.00    Types: Cigarettes  . Smokeless tobacco: Never Used  Substance Use Topics  . Alcohol use: Yes    Alcohol/week: 3.0 standard drinks    Types: 3 Cans of beer per week    Comment: 2-3 times   Marital Status: Single   ROS  ***ROS  Objective  There were no vitals taken for this visit.  Vitals with BMI 10/16/2020 10/15/2020 10/15/2020  Height - - -  Weight - - -  BMI - - -  Systolic 580 998 338  Diastolic 88 89 70  Pulse 95 105 99  Some encounter information is confidential and restricted. Go to Review Flowsheets activity to see all data.      ***Physical Exam  Laboratory examination:   Recent Labs    06/20/20 1431 09/09/20 2147 10/14/20 0333 10/15/20 0326 10/16/20 0717  NA 138    < > 128* 129* 129*  K 3.9   < > 4.1 4.2 4.7  CL 100   < > 90* 90* 89*  CO2 25   < > 29 27 28   GLUCOSE 97   < > 106* 110* 112*  BUN 4*   < > 23 10 18   CREATININE 0.72   < > 0.58 0.43* 0.37*  CALCIUM 9.2   < > 9.5 9.6 9.7  GFRNONAA 90   < > >60 >60 >60  GFRAA 104  --   --   --   --    < > = values in this interval not displayed.   estimated creatinine clearance is 80.1 mL/min (A) (by C-G formula based on SCr of 0.37 mg/dL (L)).  CMP Latest Ref Rng & Units 10/16/2020 10/15/2020 10/14/2020  Glucose 70 - 99 mg/dL 112(H) 110(H) 106(H)  BUN 8 - 23 mg/dL 18 10 23   Creatinine 0.44 - 1.00 mg/dL 0.37(L) 0.43(L) 0.58  Sodium 135 - 145 mmol/L 129(L) 129(L) 128(L)  Potassium 3.5 - 5.1 mmol/L 4.7 4.2 4.1  Chloride 98 - 111 mmol/L 89(L) 90(L) 90(L)  CO2 22 - 32 mmol/L 28 27 29   Calcium 8.9 - 10.3 mg/dL 9.7 9.6 9.5  Total Protein 6.5 - 8.1 g/dL - - -  Total Bilirubin 0.3 - 1.2 mg/dL - - -  Alkaline Phos 38 - 126 U/L - - -  AST 15 - 41 U/L - - -  ALT 0 - 44 U/L - - -   CBC Latest Ref Rng & Units 10/16/2020 10/12/2020 09/27/2020  WBC 4.0 - 10.5 K/uL 6.9 8.1 7.5  Hemoglobin 12.0 - 15.0 g/dL 13.4 15.2(H) 14.0  Hematocrit 36.0 - 46.0 % 39.7 43.3 40.9  Platelets 150 - 400 K/uL 136(L) 183 452(H)    Lipid Panel Recent Labs    09/17/20 0208  CHOL 97  TRIG 68  LDLCALC 42  VLDL 14  HDL 41  CHOLHDL 2.4    HEMOGLOBIN A1C Lab Results  Component Value Date   HGBA1C 5.2 09/17/2020   MPG 102.54 09/17/2020   TSH Recent Labs    09/12/20 0949 09/25/20 1429 10/12/20 2244  TSH 0.827 2.088 2.413    External labs:   ***  Medications and allergies  No Known Allergies   Facility-Administered Medications Prior to Visit  Medication Dose Route Frequency Provider Last Rate Last Admin  . acetaminophen (TYLENOL) tablet 650 mg  650 mg Oral Q6H PRN Samuella Cota, MD   650 mg at 10/16/20 7846  . albuterol (VENTOLIN HFA) 108 (90 Base) MCG/ACT inhaler 2 puff  2 puff Inhalation Q4H PRN Lavina Hamman,  MD      . ALPRAZolam Duanne Moron) tablet 2 mg  2 mg Oral BID Samuella Cota, MD   2 mg at 10/16/20 9629  . apixaban (ELIQUIS) tablet 5 mg  5 mg Oral BID Para Skeans, MD   5 mg at 10/16/20 0839  . ARIPiprazole (ABILIFY) tablet 5 mg  5 mg Oral Daily Samuella Cota, MD   5 mg at 10/16/20 5284  . bisacodyl (DULCOLAX) suppository 10 mg  10 mg Rectal Daily PRN Samuella Cota, MD      . feeding supplement (ENSURE ENLIVE / ENSURE PLUS) liquid 237 mL  237 mL Oral BID BM Samuella Cota, MD   237 mL at 10/14/20 1232  . gabapentin (NEURONTIN) capsule 600 mg  600 mg Oral TID Lavina Hamman, MD      . guaiFENesin [facility]) 12 hr tablet 600 mg  600 mg Oral BID Lavina Hamman, MD   600 mg at 10/16/20 1023  . LORazepam (ATIVAN) injection 2 mg  2 mg Intravenous PRN Sharion Settler, NP      . metoprolol tartrate (LOPRESSOR) tablet 12.5 mg  12.5 mg Oral BID Para Skeans, MD   12.5 mg at 10/16/20 0839  . mometasone-formoterol (DULERA) 100-5 MCG/ACT inhaler 2 puff  2 puff Inhalation BID Lavina Hamman, MD      . multivitamin with minerals tablet 1 tablet  1 tablet Oral Daily Samuella Cota, MD   1 tablet at 10/16/20 [phone]  . nicotine (NICODERM CQ - dosed in mg/24 hours) patch 21 mg  21 mg Transdermal Daily Para Skeans, MD   21 mg at 10/16/20 0842  . ondansetron (ZOFRAN) tablet 4 mg  4 mg Oral Q6H PRN Para Skeans, MD       Or  . ondansetron [facility]) injection 4 mg  4 mg  Intravenous Q6H PRN Para Skeans, MD      . pantoprazole (PROTONIX) EC tablet 40 mg  40 mg Oral Daily Samuella Cota, MD   40 mg at 10/16/20 0839  . sodium chloride (OCEAN) 0.65 % nasal spray 1 spray  1 spray Each Nare PRN Sharion Settler, NP      . thiamine tablet 100 mg  100 mg Oral Daily Para Skeans, MD   100 mg at 10/16/20 0839  . umeclidinium bromide (INCRUSE ELLIPTA) 62.5 MCG/INH 1 puff  1 puff Inhalation Daily Lavina Hamman, MD      . urea (URE-NA) oral packet 15 g  15 g Oral BID Lavina Hamman, MD   15 g at  10/16/20 2694   Outpatient Medications Prior to Visit  Medication Sig Dispense Refill  . acetaminophen (TYLENOL) 500 MG tablet Take 1,000 mg by mouth every 6 (six) hours as needed for mild pain.    Marland Kitchen ALPRAZolam (XANAX) 1 MG tablet Take 2 tablets (2 mg total) by mouth 2 (two) times daily. (Patient taking differently: Take 1 mg by mouth 2 (two) times daily.) 56 tablet 0  . apixaban (ELIQUIS) 5 MG TABS tablet Take 1 tablet (5 mg total) by mouth 2 (two) times daily. 60 tablet 0  . ARIPiprazole (ABILIFY) 5 MG tablet Take 5 mg by mouth daily.    . fluticasone (FLONASE) 50 MCG/ACT nasal spray SHAKE LIQUID AND USE 2 SPRAYS IN EACH NOSTRIL DAILY (Patient taking differently: Place 2 sprays into both nostrils daily as needed for allergies or rhinitis.) 16 g 0  . gabapentin (NEURONTIN) 600 MG tablet Take 2 tablets (1,200 mg total) by mouth 3 (three) times daily. 180 tablet 2  . metoprolol tartrate (LOPRESSOR) 100 MG tablet Take 1 tablet (100 mg total) by mouth 2 (two) times daily. 60 tablet 0  . Multiple Vitamins-Minerals (CENTRUM SILVER 50+WOMEN PO) Take 1 tablet by mouth daily.    Marland Kitchen omeprazole (PRILOSEC) 20 MG capsule Take 1 capsule (20 mg total) by mouth daily. 30 capsule 0  . Probiotic Product (PROBIOTIC PO) Take 1 capsule by mouth daily.    . QUEtiapine Fumarate (SEROQUEL XR) 150 MG 24 hr tablet Take 150 mg by mouth at bedtime.    . urea (URE-NA) 15 g PACK oral packet Take 15 g by mouth daily. 7 packet 0     Radiology:   DG CHEST PORT 1 VIEW  Result Date: 10/16/2020 CLINICAL DATA:  Mid chest pain.  Hypoxia. EXAM: PORTABLE CHEST 1 VIEW COMPARISON:  09/25/2020 FINDINGS: The heart size and mediastinal contours are within normal limits. Both lungs are clear. The visualized skeletal structures are unremarkable. IMPRESSION: No active disease. Electronically Signed   By: Claudie Revering M.D.   On: 10/16/2020 07:46    Cardiac Studies:   ***  EKG:   ***  ***  Assessment  No diagnosis found.   There  are no discontinued medications.  No orders of the defined types were placed in this encounter.   Recommendations:   ALANNY RIVERS is a 64 y.o. ***  ***  Patient was seen in collaboration with Dr. Marland Kitchen He also reviewed patient's chart and examined the patient. Dr. Marland Kitchen is in agreement of the plan.   During this visit I reviewed and updated: Tobacco history  allergies medication reconciliation  medical history  surgical history  family history  social history.  This note was created using a voice recognition software as a result there may  be grammatical errors inadvertently enclosed that do not reflect the nature of this encounter. Every attempt is made to correct such errors.   Alethia Berthold, PA-C 10/16/2020, 10:40 AM Office: [phone]

## 2020-10-16 NOTE — Progress Notes (Signed)
[facility] Progress Note  Patient: Karen Casey    ALP:[id number]  DOA: 10/12/2020     Date of Service: the patient was seen and examined on 10/16/2020  Brief hospital course: Past medical history of SIADH, alcohol abuse, chronic A. fib, alcohol withdrawal seizures, tobacco abuse, mood disorder.  Presents with complaints of fatigue.  Found to have severe hyponatremia. Currently plan is monitor sodium for correction and monitor medication side effect as well as work-up for sleep apnea  Assessment and Plan: 1.  SIADH. Recurrent hyponatremia Sodium level gradually improving. Diagnosed with SIADH in the past. Currently will continue fluid restriction.  Change diet to regular diet. Adding urea tablets again. Monitor daily BMP. Reportedly drinking a lot of Gatorade. Unable to confirm medications with husband. Gabapentin is associated with hyponatremia. Abilify is association with xerostomia as well as hyponatremia. Seroquel is associated with SIADH. Omeprazole associated with hyponatremia as well. Unable to confirm medication with patient or her husband.  2.  History of alcohol abuse. Currently no evidence of withdrawal.  Monitor. Has history of seizures.  Monitor.  3.  Compression fracture Multiple seen on CT scan. Currently does not have any pain. Likely associated with hyponatremia as well as polypharmacy. PT OT consulted.  4.  Paroxysmal A. fib Currently sinus rhythm. On anticoagulation.  At risk for bleeding secondary to potential recurrent falls at home. Monitor.  5.  Abdominal pain, chest pain. CT chest abdomen pelvis negative for any acute abnormality. Also negative for any malignancy as a result of her hyponatremia.  6.  Severe malnutrition Obesity Placing the patient at high risk for poor outcome. Currently on Ensure. Body mass index is 30.28 kg/m.  Nutrition Problem: Severe Malnutrition Etiology: acute illness Interventions: Interventions: Ensure  Enlive (each supplement provides 350kcal and 20 grams of protein),Magic cup,MVI   7.  Overnight hypoxia. Overnight seizure-like event. On 5/4 night patient had an event where she had some seizure-like activity without any loss of control bowel or bladder followed by confusion. Patient actually remembers this whole episode as a dream. I do not think the patient suffered a seizure event. Patient does not have any focal deficit on repeat examination. Will check EEG still to rule out any acute abnormality. Suspect this is due to sleep apnea given her associated hypoxia. Check overnight oximetry. Outpatient sleep study.  8.  Depression. Mood disorder. Patient is on Xanax, was asked to gradually reduce it but has not had a follow-up with PCP for now continue. Patient is on Abilify currently tolerating it well.  We will discontinue Seroquel on discharge given that patient is already on Abilify. Patient does not have Augmentin but does not take it on a regular basis.  Reduce the dose of gabapentin from 1200 mg to 600 mg 3 times daily.  Diet: Regular diet DVT Prophylaxis:   SCDs Start: 10/12/20 2106 apixaban (ELIQUIS) tablet 5 mg    Advance goals of care discussion: Full code  Family Communication: Daughter was present at bedside, at the time of interview.  The pt provided permission to discuss medical plan with the family. Opportunity was given to ask question and all questions were answered satisfactorily.   Disposition:  Status is: Inpatient  Remains inpatient appropriate because:IV treatments appropriate due to intensity of illness or inability to take PO   Dispo: The patient is from: Home              Anticipated d/c is to: to be determined  Patient currently is not medically stable to d/c.   Difficult to place patient No  Subjective: Feeling better.  No nausea or vomiting.  Overnight had an episode which was described as a potential seizure like event without any loss  of control of bowel or bladder followed by confusion.  Currently back to baseline.  Physical Exam:  General: Appear in mild distress, no Rash; Oral Mucosa Clear, moist. no Abnormal Neck Mass Or lumps, Conjunctiva normal  Cardiovascular: S1 and S2 Present, no Murmur, Respiratory: good respiratory effort, Bilateral Air entry present and CTA, no Crackles, no wheezes Abdomen: Bowel Sound present, Soft and no tenderness Extremities: no Pedal edema Neurology: alert and oriented to time, place, and person affect appropriate. no new focal deficit Gait not checked due to patient safety concerns  Vitals:   10/15/20 2038 10/15/20 2119 10/16/20 0528 10/16/20 1159  BP: 119/70 (!) 147/89 124/88 121/69  Pulse: 99 (!) 105 95 92  Resp: 18 20 18 18   Temp: 97.9 F (36.6 C) 97.8 F (36.6 C) 98.4 F (36.9 C) 98.3 F (36.8 C)  TempSrc: Oral Oral Oral Oral  SpO2: 96% 100% 99% 95%  Weight:      Height:        Intake/Output Summary (Last 24 hours) at 10/16/2020 1941 Last data filed at 10/16/2020 1834 Gross per 24 hour  Intake 2160 ml  Output 2250 ml  Net -90 ml   Filed Weights   10/12/20 1811  Weight: 85.1 kg    Data Reviewed: I have personally reviewed and interpreted daily labs, tele strips, imaging. I reviewed all nursing notes, pharmacy notes, vitals, pertinent old records I have discussed plan of care as described above with RN and patient/family.  CBC: Recent Labs  Lab 10/12/20 1747 10/16/20 0717  WBC 8.1 6.9  NEUTROABS  --  5.1  HGB 15.2* 13.4  HCT 43.3 39.7  MCV 91.2 93.9  PLT 183 154*   Basic Metabolic Panel: Recent Labs  Lab 10/13/20 0401 10/13/20 0836 10/13/20 1947 10/14/20 0333 10/15/20 0326 10/16/20 0717 10/16/20 1652  NA 128*   < > 130* 128* 129* 129* 128*  K  --    < > 4.5 4.1 4.2 4.7 4.4  CL  --    < > 92* 90* 90* 89* 89*  CO2  --    < > 28 29 27 28 29   GLUCOSE  --    < > 115* 106* 110* 112* 112*  BUN  --    < > 18 23 10 18 22   CREATININE  --    < > 0.47  0.58 0.43* 0.37* 0.45  CALCIUM  --    < > 9.5 9.5 9.6 9.7 9.5  MG 1.5*  --   --   --   --  1.6*  --    < > = values in this interval not displayed.    Studies: DG CHEST PORT 1 VIEW  Result Date: 10/16/2020 CLINICAL DATA:  Mid chest pain.  Hypoxia. EXAM: PORTABLE CHEST 1 VIEW COMPARISON:  09/25/2020 FINDINGS: The heart size and mediastinal contours are within normal limits. Both lungs are clear. The visualized skeletal structures are unremarkable. IMPRESSION: No active disease. Electronically Signed   By: Claudie Revering M.D.   On: 10/16/2020 07:46    Scheduled Meds: . ALPRAZolam  2 mg Oral BID  . apixaban  5 mg Oral BID  . ARIPiprazole  5 mg Oral Daily  . feeding supplement  237 mL Oral BID  BM  . gabapentin  600 mg Oral TID  . guaiFENesin  600 mg Oral BID  . metoprolol tartrate  12.5 mg Oral BID  . mometasone-formoterol  2 puff Inhalation BID  . multivitamin with minerals  1 tablet Oral Daily  . nicotine  21 mg Transdermal Daily  . pantoprazole  40 mg Oral Daily  . thiamine  100 mg Oral Daily  . umeclidinium bromide  1 puff Inhalation Daily  . urea  15 g Oral BID   Continuous Infusions: PRN Meds: acetaminophen, albuterol, bisacodyl, LORazepam, ondansetron **OR** ondansetron (ZOFRAN) IV, sodium chloride  Time spent: 35 minutes  Author: Berle Mull, MD [facility] 10/16/2020 7:41 PM  To reach On-call, see care teams to locate the attending and reach out via [email or web address]. Between 7PM-7AM, please contact night-coverage If you still have difficulty reaching the attending provider, please page the [facility] (Director on Call) for [facility] on amion for assistance.

## 2020-10-17 ENCOUNTER — Inpatient Hospital Stay (HOSPITAL_COMMUNITY)
Admit: 2020-10-17 | Discharge: 2020-10-17 | Disposition: A | Discharge status: Home / Self Care | Payer: 59 | Attending: Internal Medicine | Admitting: Internal Medicine

## 2020-10-17 ENCOUNTER — Ambulatory Visit: Payer: Self-pay | Admitting: Student

## 2020-10-17 ENCOUNTER — Other Ambulatory Visit (HOSPITAL_COMMUNITY): Payer: Self-pay

## 2020-10-17 ENCOUNTER — Ambulatory Visit: Payer: 59 | Admitting: Family Medicine

## 2020-10-17 DIAGNOSIS — R569 Unspecified convulsions: Secondary | ICD-10-CM

## 2020-10-17 LAB — BASIC METABOLIC PANEL
Anion gap: 11 (ref 5–15)
BUN: 26 mg/dL — ABNORMAL HIGH (ref 8–23)
CO2: 26 mmol/L (ref 22–32)
Calcium: 9.3 mg/dL (ref 8.9–10.3)
Chloride: 92 mmol/L — ABNORMAL LOW (ref 98–111)
Creatinine, Ser: 0.57 mg/dL (ref 0.44–1.00)
GFR, Estimated: >60 mL/min (ref >60)
Glucose, Bld: 117 mg/dL — ABNORMAL HIGH (ref 70–99)
Potassium: 4.5 mmol/L (ref 3.5–5.1)
Sodium: 129 mmol/L — ABNORMAL LOW (ref 135–145)

## 2020-10-17 LAB — BLOOD GAS, ARTERIAL
Acid-Base Excess: 5 mmol/L — ABNORMAL HIGH (ref 0.0–2.0)
Bicarbonate: 30 mmol/L — ABNORMAL HIGH (ref 20.0–28.0)
Drawn by: 23532
O2 Content: 3 L/min
O2 Saturation: 96.3 %
Patient temperature: 98.6
pCO2 arterial: 47.3 mmHg (ref 32.0–48.0)
pH, Arterial: 7.418 (ref 7.350–7.450)
pO2, Arterial: 79.9 mmHg — ABNORMAL LOW (ref 83.0–108.0)

## 2020-10-17 LAB — MAGNESIUM: Magnesium: 1.7 mg/dL (ref 1.7–2.4)

## 2020-10-17 MED ORDER — LIP MEDEX EX OINT
TOPICAL_OINTMENT | CUTANEOUS | Status: AC
  Filled 2020-10-17: qty 7

## 2020-10-17 MED ORDER — GUAIFENESIN ER 600 MG PO TB12: 600.0000 mg | ORAL_TABLET | Freq: Two times a day (BID) | ORAL | 0 refills | Status: DC

## 2020-10-17 MED ORDER — PANTOPRAZOLE SODIUM 40 MG PO TBEC: 40.0000 mg | DELAYED_RELEASE_TABLET | Freq: Every day | ORAL | 0 refills | Status: DC

## 2020-10-17 MED ORDER — GABAPENTIN 600 MG PO TABS
1200.0000 mg | ORAL_TABLET | Freq: Three times a day (TID) | ORAL | 0 refills | Status: DC
  Filled 2020-10-17: qty 180, 30d supply, fill #0

## 2020-10-17 MED ORDER — NICOTINE 21 MG/24HR TD PT24: 21.0000 mg | MEDICATED_PATCH | Freq: Every day | TRANSDERMAL | 0 refills | Status: DC

## 2020-10-17 MED ORDER — METOPROLOL TARTRATE 25 MG PO TABS: 25.0000 mg | ORAL_TABLET | Freq: Two times a day (BID) | ORAL | 0 refills | Status: DC

## 2020-10-17 MED ORDER — PANTOPRAZOLE SODIUM 40 MG PO TBEC
40.0000 mg | DELAYED_RELEASE_TABLET | Freq: Every day | ORAL | 0 refills | Status: DC
  Filled 2020-10-17 – 2020-10-20 (×2): qty 30, 30d supply, fill #0

## 2020-10-17 MED ORDER — LIP MEDEX EX OINT: TOPICAL_OINTMENT | CUTANEOUS | Status: DC | PRN

## 2020-10-17 MED ORDER — UREA 15 G PO PACK: 15.0000 g | PACK | Freq: Every day | ORAL | 0 refills | Status: DC

## 2020-10-17 MED ORDER — NICOTINE 21 MG/24HR TD PT24
21.0000 mg | MEDICATED_PATCH | Freq: Every day | TRANSDERMAL | 0 refills | Status: DC
Start: 2020-10-18 — End: 2020-12-08
  Filled 2020-10-17 – 2020-10-20 (×2): qty 28, 28d supply, fill #0

## 2020-10-17 MED ORDER — UREA 15 G PO PACK
15.0000 g | PACK | Freq: Every day | ORAL | 0 refills | Status: AC
  Filled 2020-10-17: qty 7, 7d supply, fill #0

## 2020-10-17 MED ORDER — GABAPENTIN 600 MG PO TABS: 1200.0000 mg | ORAL_TABLET | Freq: Three times a day (TID) | ORAL | 0 refills | Status: DC

## 2020-10-17 MED ORDER — METOPROLOL TARTRATE 25 MG PO TABS
25.0000 mg | ORAL_TABLET | Freq: Two times a day (BID) | ORAL | 0 refills | Status: DC
  Filled 2020-10-17 – 2020-10-20 (×2): qty 60, 30d supply, fill #0

## 2020-10-17 MED ORDER — ENSURE ENLIVE PO LIQD: 237.0000 mL | Freq: Two times a day (BID) | ORAL | 12 refills | Status: DC

## 2020-10-17 MED ORDER — ENSURE ENLIVE PO LIQD
237.0000 mL | Freq: Two times a day (BID) | ORAL | 0 refills | Status: DC
  Filled 2020-10-17: qty 10000, 21d supply, fill #0

## 2020-10-17 MED ORDER — THIAMINE HCL 100 MG PO TABS: 100.0000 mg | ORAL_TABLET | Freq: Every day | ORAL | 0 refills | Status: DC

## 2020-10-17 NOTE — Progress Notes (Signed)
EEG Completed; Results Pending  

## 2020-10-17 NOTE — TOC Transition Note (Signed)
Transition of Care [facility]) - CM/SW Discharge Note   Patient Details  Name: Karen Casey MRN: [id number] Date of Birth: 1956/04/27  Transition of Care [facility]) CM/SW Contact:  Ross Ludwig, LCSW Phone Number: 10/17/2020, 3:40 PM   Clinical Narrative:     Patient has orders for oxygen at night.  CSW spoke to [facility], they are able to provide patient oxygen.  Oxygen will be delivered to patient's home later today.  CSW signing off, no other needs.   Final next level of care: Home/Self Care Barriers to Discharge: Barriers Resolved   Patient Goals and CMS Choice Patient states their goals for this hospitalization and ongoing recovery are:: To return back home. CMS Medicare.gov Compare Post Acute Care list provided to:: Patient Choice offered to / list presented to : Patient  Discharge Placement                       Discharge Plan and Services                DME Arranged: Oxygen DME Agency:  [facility]) Date DME Agency Contacted: 10/17/20 Time DME Agency Contacted: [id number] Representative spoke with at DME Agency: [facility] ([place]) Interventions     Readmission Risk Interventions No flowsheet data found.

## 2020-10-17 NOTE — Progress Notes (Signed)
Overnight Pulse Oximetry stopped at this time

## 2020-10-17 NOTE — Progress Notes (Signed)
Patient O2 dropped to 69 on room air while sleeping.  Nasal cannula placed and 5 L O2 administered.  Levels quickly rose to low 90's.  Within the half hour, levels maintained in mid 90's.  At [place], this nurse tried patient on room air for 15 minutes.  Patient O2 dropped into upper 80's and oxygen was reapplied at 2L.  O2 did not rise above 91, so O2 was increased to 3L.  Patient continued to sat in mid-upper 90's.  Will continue to monitor.  Candie Mile, RN

## 2020-10-17 NOTE — Procedures (Signed)
Patient Name: Karen Casey  MRN: [id number]  Epilepsy Attending: Lora Havens  Referring Physician/Provider: Dr. Berle Mull Date: 10/17/2020 Duration: 23.05 mins  Patient history: 65 year old female noted to have seizure-like activity without loss of bowel or bladder control followed by confusion.  EEG to evaluate for seizures.  Level of alertness: Awake, asleep  AEDs during EEG study: Xanax, gabapentin  Technical aspects: This EEG study was done with scalp electrodes positioned according to the 10-20 International system of electrode placement. Electrical activity was acquired at a sampling rate of 500Hz  and reviewed with a high frequency filter of 70Hz  and a low frequency filter of 1Hz . EEG data were recorded continuously and digitally stored.   Description: The posterior dominant rhythm consists of 8 Hz activity of moderate voltage (25-35 uV) seen predominantly in posterior head regions, symmetric and reactive to eye opening and eye closing. Sleep was characterized by vertex waves, sleep spindles (12 to 14 Hz), maximal frontocentral region. Hyperventilation and photic stimulation were not performed.     IMPRESSION: This study is within normal limits. No seizures or epileptiform discharges were seen throughout the recording.  Rene Gonsoulin Barbra Sarks

## 2020-10-20 ENCOUNTER — Other Ambulatory Visit (HOSPITAL_COMMUNITY): Payer: Self-pay

## 2020-10-20 NOTE — Discharge Summary (Signed)
[facility] Discharge Summary   Patient: Karen Casey EVO:[id number]  PCP: Karen Chime, MD  Date of admission: 10/12/2020   Date of discharge: 10/17/2020     Discharge Diagnoses:  Principal Problem:   Hyponatremia Active Problems:   GERD (gastroesophageal reflux disease)   Depression   Tobacco abuse   Alcohol use   HTN (hypertension)   Pulmonary nodules/lesions, multiple   Alcohol withdrawal seizure with delirium (HCC)   Paroxysmal atrial fibrillation (HCC)   Hypomagnesemia   SIADH (syndrome of inappropriate ADH production) ([place])   Hyperkalemia   Admitted From: Home Disposition:  Home with home health  Recommendations for Outpatient Follow-up:  1. PCP: Follow-up with PCP in 1 week. 2. Follow up LABS/TEST: Repeat BMP, discuss changing Xanax or discontinuing gradually 3. Discussed anticoagulation as patient is at risk for recurrent fall. 4. Needs sleep apnea assessment.  Positive overnight hypoxia. 5. New Meds: Urea packet for 7 days, bleeding supplement, Mucinex, including patch, Protonix, thiamine 6. Changed meds: Lopressor dose, gabapentin dose 7. Stopped meds: Omeprazole, Seroquel (already on Abilify)   Follow-up Information    Casey, Karen Levo, MD. Schedule an appointment as soon as possible for a visit in 1 week(s).   Specialty: Family Medicine Contact information: [place] [place] [id number] [phone]              Discharge Instructions    Diet - low sodium heart healthy   Complete by: As directed    Discharge instructions   Complete by: As directed    It is important that you read the instructions as well as go over your medication list with RN to help you understand your care after this hospitalization.  Please follow-up with PCP in 1-2 weeks.  Please note that we are unable to authorize any refills for discharge medications, once you are discharged. Thus, it is imperative that you return to your primary care physician (or  establish a relationship with a primary care physician if you do not have one) for your care needs. So that they can reassess your need for medications and monitor your lab values.  Please request your primary care physician to go over all Hospital Tests and Procedure/Radiological results at the follow up. Please get all Hospital records sent to your PCP by signing hospital release before you go home.   Do not drive, operating heavy machinery, perform activities at heights, swimming or participation in water activities or provide baby sitting services; until you have been seen by Primary Care Physician and are cleared to do such activities.  Do not take more than prescribed Pain, Sleep and Anxiety Medications.  You were cared for by a hospitalist during your hospital stay. If you have any questions about your discharge medications or the care you received while you were in the hospital after you are discharged, you can call the hospital unit/floor you were admitted to and ask to speak with the hospitalist who took care of you. Ask for Hospitalist on call, if the hospitalist that took care of you is not available. Once you are discharged, your primary care physician will help you with any further medical issues. You Must read complete instructions/literature along with all the possible adverse reactions/side effects for all the Medicines you take and that have been prescribed to you. Take any new Medicines after you have completely understood and accept all the possible adverse reactions/side effects. If you have smoked or chewed Tobacco, please STOP smoking. please STOP any  Recreational drug use. If you drink alcohol, please safely STOP the use. Do not drive, operating heavy machinery, perform activities at heights, swimming or participation in water activities or provide baby sitting services under influence.   Increase activity slowly   Complete by: As directed       Diet recommendation:  Regular diet  Activity: The patient is advised to gradually reintroduce usual activities, as tolerated  Discharge Condition: stable  Code Status: Full code   History of present illness: As per the H and P dictated on admission, "Karen Casey is a 65 y.o. female seen in ed with complaints of abnormal lab found to be hyponatremic with a serum sodium of 120.  Patient was following up with her PCP who had blood work done for her low serum sodium and was advised to come to the emergency room.  Patient does report some generalized fatigue but no seizures no headaches no chest pain shortness of breath blurred vision gait abnormalities any abdominal issues any breathing issues.  And review of systems is otherwise negative as mentioned below.  Medication reconciliation is pending and from previous admission patient is to taper down her Celexa, Cymbalta was stopped patient is also tapering down her Xanax.  By EMS patient received 500 cc of normal saline.Pt appears dry and weak and usually uses walker for ambulation. Reports chronic back pain and today mideigastric pain from her reflex and left sided chest pain along with abdominal pain.she is poor historian and doe snot recall detail of her symptoms. Abd pain is nonradiating. Headache is 4/10 frontal area.  Patient was admitted on 09/25/2020 for same presentation and discharged on 09/28/2020 with a plan for SIADH related hyponatremia, sodium tablets, fluid restriction, modification of medications and discontinuation of Celexa and Cymbalta.PT was given ivf due to dehydration presentation in previous admission as well similar to this one however no improvement of sodium. Pt had received tolvaptan and urea and pts celexa was tapered and cymbalta was discontinued. Pt does have h/o tobacco abuse and last chest ct for lung cancer screening was in jan 2022 showing multiple lung nodules. Lungs/Pleura: No pneumothorax. No pleural effusion. No acute consolidative  airspace disease or lung masses. Mild centrilobular emphysema with mild diffuse bronchial wall thickening. Numerous scattered solid pulmonary nodules in the right greater than left lungs with dominant clustered nodularity in the medial right lower lobe measuring up to 6.8 mm in volume derived mean diameter , for full report  See results. Chest xray on 4/14 shows stable left lingular subsegmental atelectasis or scarring. "  Hospital Course:  Summary of her active problems in the hospital is as following.   1.  SIADH. Recurrent hyponatremia Current presentation likely multifactorial in the setting of SIADH history as well as polydipsia and medication side effect. Sodium level gradually improving. Diagnosed with SIADH in the past. Adding urea tablets again. Reportedly drinking a lot of Gatorade. Unable to confirm medications with husband. Gabapentin is associated with hyponatremia. Abilify is association with xerostomia as well as hyponatremia. Seroquel is associated with SIADH. Omeprazole associated with hyponatremia as well. At present we will reduce the dose of the gabapentin from 400 mg to 600 mg.  Discontinue Seroquel as it is a duplicate therapy.  Recommend outpatient follow-up with PCP.  2.  History of alcohol abuse. Currently no evidence of withdrawal.  Monitor. Has history of seizures.  Monitor.  3.  Compression fracture Multiple seen on CT scan. Currently does not have any pain. Likely associated with  recurrent fall with hyponatremia as well as polypharmacy. PT OT consulted.  Recommend home health.  4.  Paroxysmal A. fib Currently sinus rhythm. On anticoagulation.  At risk for bleeding secondary to potential recurrent falls at home. Monitor.  5.  Abdominal pain, chest pain.  Resolved CT chest abdomen pelvis negative for any acute abnormality. Also negative for any malignancy as a result of her hyponatremia.  6.  Severe malnutrition Obesity Placing the patient  at high risk for poor outcome. Currently on Ensure. Body mass index is 30.28 kg/m.  Nutrition Problem: Severe Malnutrition Etiology: acute illness Interventions: Interventions: Ensure Enlive (each supplement provides 350kcal and 20 grams of protein),Magic cup,MVI   7.  Overnight hypoxia. Overnight seizure-like event.  Seizure ruled out. On 5/4 night patient had an event where she had some seizure-like activity without any loss of control bowel or bladder followed by confusion. Patient actually remembers this whole episode as a dream. I do not think the patient suffered a seizure event. Patient does not have any focal deficit on repeat examination. Unremarkable EEG for seizure or epileptogenic foci. Suspect this is due to sleep apnea given her associated hypoxia. Positive overnight oximetry.  We will arrange oxygen on discharge overnight use. Recommend outpatient sleep study.  8.  Depression. Mood disorder. Patient is on Xanax, was asked to gradually reduce it but has not had a follow-up with PCP for now continue. Patient is on Abilify currently tolerating it well.  We will discontinue Seroquel on discharge given that patient is already on Abilify. Patient is listed to have 1200 mg of gabapentin 3 times daily but does not take it on a regular basis.  Reduce the dose of gabapentin from 1200 mg to 600 mg 3 times daily. Recommend further reduction and complete stoppage of gabapentin if the patient does not need this medication.  Patient was seen by physical therapy, who recommended Home Health. On the day of the discharge the patient's vitals were stable, and no other acute medical condition were reported by patient. The patient was felt safe to be discharge at Home with Home health.  Consultants: none Procedures: none  DISCHARGE MEDICATION: Allergies as of 10/17/2020   No Known Allergies     Medication List    STOP taking these medications   omeprazole 20 MG capsule Commonly  known as: PRILOSEC   QUEtiapine Fumarate 150 MG 24 hr tablet Commonly known as: SEROQUEL XR     TAKE these medications   acetaminophen 500 MG tablet Commonly known as: TYLENOL Take 1,000 mg by mouth every 6 (six) hours as needed for mild pain.   ALPRAZolam 1 MG tablet Commonly known as: XANAX Take 2 tablets (2 mg total) by mouth 2 (two) times daily. What changed: how much to take   ARIPiprazole 5 MG tablet Commonly known as: ABILIFY Take 5 mg by mouth daily.   CENTRUM SILVER 50+WOMEN PO Take 1 tablet by mouth daily.   Eliquis 5 MG Tabs tablet Generic drug: apixaban Take 1 tablet (5 mg total) by mouth 2 (two) times daily.   feeding supplement Liqd Take 237 mLs by mouth 2 (two) times daily between meals.   fluticasone 50 MCG/ACT nasal spray Commonly known as: FLONASE SHAKE LIQUID AND USE 2 SPRAYS IN EACH NOSTRIL DAILY What changed:   how much to take  how to take this  when to take this  reasons to take this  additional instructions   gabapentin 600 MG tablet Commonly known as: NEURONTIN Take 2  tablets (1,200 mg total) by mouth 3 (three) times daily.   guaiFENesin 600 MG 12 hr tablet Commonly known as: MUCINEX Take 1 tablet (600 mg total) by mouth 2 (two) times daily.   metoprolol tartrate 25 MG tablet Commonly known as: LOPRESSOR Take 1 tablet (25 mg total) by mouth 2 (two) times daily. What changed:   medication strength  how much to take   nicotine 21 mg/24hr patch Commonly known as: NICODERM CQ - dosed in mg/24 hours Place 1 patch (21 mg total) onto the skin daily.   pantoprazole 40 MG tablet Commonly known as: PROTONIX Take 1 tablet (40 mg total) by mouth daily.   PROBIOTIC PO Take 1 capsule by mouth daily.   thiamine 100 MG tablet Take 1 tablet (100 mg total) by mouth daily.   urea 15 g Pack oral packet Commonly known as: URE-NA Take 1 packet (15g) by mouth daily as directed for 7 days.       Discharge Exam: Filed Weights    10/12/20 1811  Weight: 85.1 kg   Vitals:   10/17/20 0846 10/17/20 1307  BP:  129/90  Pulse:  87  Resp:  (!) 21  Temp:  98 F (36.7 C)  SpO2: 95% 96%   General: Appear in mild distress, no Rash; Oral Mucosa Clear, moist. no Abnormal Neck Mass Or lumps, Conjunctiva normal  Cardiovascular: S1 and S2 Present, no Murmur, Respiratory: good respiratory effort, Bilateral Air entry present and CTA, no Crackles, no wheezes Abdomen: Bowel Sound present, Soft and no tenderness Extremities: no Pedal edema Neurology: alert and oriented to time, place, and person affect appropriate. no new focal deficit Gait not checked due to patient safety concerns    The results of significant diagnostics from this hospitalization (including imaging, microbiology, ancillary and laboratory) are listed below for reference.    Significant Diagnostic Studies: DG Chest 2 View  Result Date: 09/25/2020 CLINICAL DATA:  Chest pain. EXAM: CHEST - 2 VIEW COMPARISON:  September 20, 2020. FINDINGS: Stable cardiomediastinal silhouette. No pneumothorax or pleural effusion is noted. Stable left lingular subsegmental atelectasis or scarring is noted. Right lung is clear. Bony thorax is unremarkable. IMPRESSION: Stable left lingular subsegmental atelectasis or scarring. Electronically Signed   By: Marijo Conception M.D.   On: 09/25/2020 15:23   CT ANGIO CHEST PE W OR WO CONTRAST  Result Date: 10/12/2020 CLINICAL DATA:  Short of breath, chest pain EXAM: CT ANGIOGRAPHY CHEST WITH CONTRAST TECHNIQUE: Multidetector CT imaging of the chest was performed using the standard protocol during bolus administration of intravenous contrast. Multiplanar CT image reconstructions and MIPs were obtained to evaluate the vascular anatomy. CONTRAST:  182mL OMNIPAQUE IOHEXOL 350 MG/ML SOLN COMPARISON:  07/14/2020, 09/25/2020 FINDINGS: Cardiovascular: This is a technically adequate evaluation of the pulmonary vasculature. No filling defects or pulmonary emboli.  The heart is unremarkable without pericardial effusion. Normal caliber of the thoracic aorta, with mild atherosclerosis of the aortic arch. Mediastinum/Nodes: No enlarged mediastinal, hilar, or axillary lymph nodes. Thyroid gland, trachea, and esophagus demonstrate no significant findings. Lungs/Pleura: No acute airspace disease, effusion, or pneumothorax. Scattered linear opacities within the mid and lower lung zones most consistent with subsegmental atelectasis. The central airways are patent. Upper Abdomen: No acute abnormality. Musculoskeletal: Multiple compression deformities have developed in the interim since prior study. A vertebral plana has developed at the T6 level, with moderate retropulsion resulting in narrowing of the central canal by approximately 50%. Compression deformity involving the mid to lower aspect of the  T5 vertebral body results in approximately 25% loss of height. No retropulsion. There are new compression deformities involving the superior endplates at P61, P50, and T12, all with less than 25% loss of height and no retropulsion. Chronic compression deformity of the T2 vertebral body superior endplate is again noted unchanged. Reconstructed images demonstrate no additional findings. Review of the MIP images confirms the above findings. IMPRESSION: 1. No evidence of pulmonary embolus. 2. Interval development of multiple thoracic compression deformities since the 07/14/2020 CT exam. Vertebral plana at T6 with retropulsion results in narrowing of the central canal by approximately 50%. New compression deformities at T5, T10, T11, and T12 as above. 3. Scattered subsegmental atelectasis bilaterally. No acute airspace disease. 4.  Aortic Atherosclerosis (ICD10-I70.0). Electronically Signed   By: Randa Ngo M.D.   On: 10/12/2020 22:59   CT ABDOMEN PELVIS W CONTRAST  Result Date: 10/12/2020 CLINICAL DATA:  Abdominal pain, hyponatremia EXAM: CT ABDOMEN AND PELVIS WITH CONTRAST TECHNIQUE:  Multidetector CT imaging of the abdomen and pelvis was performed using the standard protocol following bolus administration of intravenous contrast. CONTRAST:  134mL OMNIPAQUE IOHEXOL 350 MG/ML SOLN COMPARISON:  09/09/2020, 07/14/2020, 10/14/2017 FINDINGS: Lower chest: Lung bases are clear. Hepatobiliary: No focal liver abnormality is seen. No gallstones, gallbladder wall thickening, or biliary dilatation. Pancreas: Unremarkable. No pancreatic ductal dilatation or surrounding inflammatory changes. Spleen: Normal in size without focal abnormality. Adrenals/Urinary Tract: Adrenal glands are unremarkable. Kidneys are normal, without renal calculi, focal lesion, or hydronephrosis. Bladder is unremarkable. Stomach/Bowel: No bowel obstruction or ileus. No bowel wall thickening or inflammatory change. Minimal sigmoid diverticulosis. Vascular/Lymphatic: Aortic atherosclerosis. No enlarged abdominal or pelvic lymph nodes. Reproductive: Uterus and bilateral adnexa are unremarkable. Other: No free fluid or free gas.  No abdominal wall hernia. Musculoskeletal: Age-indeterminate compression deformities have developed within the superior endplates of D32, I71, and T12, new since 07/14/2020. No other acute bony abnormalities. Prominent spondylosis at the lumbosacral junction unchanged. IMPRESSION: 1. Age-indeterminate compression deformities involving the superior endplates of I45, Y09, and T12. 2. Sigmoid diverticulosis without diverticulitis. 3.  Aortic Atherosclerosis (ICD10-I70.0). Electronically Signed   By: Randa Ngo M.D.   On: 10/12/2020 23:15   DG CHEST PORT 1 VIEW  Result Date: 10/16/2020 CLINICAL DATA:  Mid chest pain.  Hypoxia. EXAM: PORTABLE CHEST 1 VIEW COMPARISON:  09/25/2020 FINDINGS: The heart size and mediastinal contours are within normal limits. Both lungs are clear. The visualized skeletal structures are unremarkable. IMPRESSION: No active disease. Electronically Signed   By: Claudie Revering M.D.   On:  10/16/2020 07:46   DG CHEST PORT 1 VIEW  Result Date: 09/20/2020 CLINICAL DATA:  Wheezing. EXAM: PORTABLE CHEST 1 VIEW COMPARISON:  September 16, 2020 FINDINGS: The cardiac silhouette is mildly enlarged. Right PICC line in stable position. No evidence of pneumothorax. Improved aeration of the lungs compared to the prior study with residual linear and nodular airspace opacities. Decreased left pleural effusion. Osseous structures are without acute abnormality. Soft tissues are grossly normal. IMPRESSION: 1. Improved aeration of the lungs compared to the prior study with residual linear and nodular airspace opacities. 2. Decreased left pleural effusion. Electronically Signed   By: Fidela Salisbury M.D.   On: 09/20/2020 15:42   EEG adult  Result Date: 10/17/2020 Lora Havens, MD     10/17/2020  1:39 PM Patient Name: DONISHA HOCH MRN: [id number] Epilepsy Attending: Lora Havens Referring Physician/Provider: Dr. Berle Mull Date: 10/17/2020 Duration: 23.05 mins Patient history: 65 year old female noted to have seizure-like  activity without loss of bowel or bladder control followed by confusion.  EEG to evaluate for seizures. Level of alertness: Awake, asleep AEDs during EEG study: Xanax, gabapentin Technical aspects: This EEG study was done with scalp electrodes positioned according to the 10-20 International system of electrode placement. Electrical activity was acquired at a sampling rate of 500Hz  and reviewed with a high frequency filter of 70Hz  and a low frequency filter of 1Hz . EEG data were recorded continuously and digitally stored. Description: The posterior dominant rhythm consists of 8 Hz activity of moderate voltage (25-35 uV) seen predominantly in posterior head regions, symmetric and reactive to eye opening and eye closing. Sleep was characterized by vertex waves, sleep spindles (12 to 14 Hz), maximal frontocentral region. Hyperventilation and photic stimulation were not performed.   IMPRESSION:  This study is within normal limits. No seizures or epileptiform discharges were seen throughout the recording. Lora Havens    Microbiology: Recent Results (from the past 240 hour(s))  Resp Panel by RT-PCR (Flu A&B, Covid) Nasopharyngeal Swab     Status: None   Collection Time: 10/12/20  7:23 PM   Specimen: Nasopharyngeal Swab; Nasopharyngeal(NP) swabs in vial transport medium  Result Value Ref Range Status   SARS Coronavirus 2 by RT PCR NEGATIVE NEGATIVE Final    Comment: (NOTE) SARS-CoV-2 target nucleic acids are NOT DETECTED.  The SARS-CoV-2 RNA is generally detectable in upper respiratory specimens during the acute phase of infection. The lowest concentration of SARS-CoV-2 viral copies this assay can detect is 138 copies/mL. A negative result does not preclude SARS-Cov-2 infection and should not be used as the sole basis for treatment or other patient management decisions. A negative result may occur with  improper specimen collection/handling, submission of specimen other than nasopharyngeal swab, presence of viral mutation(s) within the areas targeted by this assay, and inadequate number of viral copies(<138 copies/mL). A negative result must be combined with clinical observations, patient history, and epidemiological information. The expected result is Negative.  Fact Sheet for Patients:  EntrepreneurPulse.com.au  Fact Sheet for Healthcare Providers:  IncredibleEmployment.be  This test is no t yet approved or cleared by the [place] FDA and  has been authorized for detection and/or diagnosis of SARS-CoV-2 by FDA under an Emergency Use Authorization (EUA). This EUA will remain  in effect (meaning this test can be used) for the duration of the COVID-19 declaration under Section 564(b)(1) of the Act, 21 U.S.C.section 360bbb-3(b)(1), unless the authorization is terminated  or revoked sooner.       Influenza A by PCR NEGATIVE  NEGATIVE Final   Influenza B by PCR NEGATIVE NEGATIVE Final    Comment: (NOTE) The Xpert Xpress SARS-CoV-2/FLU/RSV plus assay is intended as an aid in the diagnosis of influenza from Nasopharyngeal swab specimens and should not be used as a sole basis for treatment. Nasal washings and aspirates are unacceptable for Xpert Xpress SARS-CoV-2/FLU/RSV testing.  Fact Sheet for Patients: EntrepreneurPulse.com.au  Fact Sheet for Healthcare Providers: IncredibleEmployment.be  This test is not yet approved or cleared by the [place] FDA and has been authorized for detection and/or diagnosis of SARS-CoV-2 by FDA under an Emergency Use Authorization (EUA). This EUA will remain in effect (meaning this test can be used) for the duration of the COVID-19 declaration under Section 564(b)(1) of the Act, 21 U.S.C. section 360bbb-3(b)(1), unless the authorization is terminated or revoked.  Performed at [facility], [place] [address], [place], [place] [address]      Labs: CBC:  Recent Labs  Lab 10/16/20 0717  WBC 6.9  NEUTROABS 5.1  HGB 13.4  HCT 39.7  MCV 93.9  PLT [id number]*   Basic Metabolic Panel: Recent Labs  Lab 10/14/20 0333 10/15/20 0326 10/16/20 0717 10/16/20 1652 10/17/20 0313  NA 128* 129* 129* 128* 129*  K 4.1 4.2 4.7 4.4 4.5  CL 90* 90* 89* 89* 92*  CO2 29 27 28 29 26   GLUCOSE 106* 110* 112* 112* 117*  BUN 23 10 18 22  26*  CREATININE 0.58 0.43* 0.37* 0.45 0.57  CALCIUM 9.5 9.6 9.7 9.5 9.3  MG  --   --  1.6*  --  1.7   Liver Function Tests: No results for input(s): AST, ALT, ALKPHOS, BILITOT, PROT, ALBUMIN in the last 168 hours. CBG: Recent Labs  Lab 10/15/20 2138  GLUCAP 122*    Time spent: 35 minutes  Signed:  Berle Mull  [facility] 10/17/2020 8:58 AM

## 2020-10-21 ENCOUNTER — Other Ambulatory Visit: Payer: Self-pay

## 2020-10-21 ENCOUNTER — Other Ambulatory Visit (HOSPITAL_COMMUNITY): Payer: Self-pay

## 2020-10-21 MED ORDER — APIXABAN 5 MG PO TABS: 5.0000 mg | ORAL_TABLET | Freq: Two times a day (BID) | ORAL | 0 refills | Status: DC

## 2020-10-22 ENCOUNTER — Ambulatory Visit: Payer: 59

## 2020-10-23 ENCOUNTER — Ambulatory Visit (INDEPENDENT_AMBULATORY_CARE_PROVIDER_SITE_OTHER): Payer: 59 | Admitting: Family Medicine

## 2020-10-23 ENCOUNTER — Other Ambulatory Visit: Payer: Self-pay

## 2020-10-23 VITALS — BP 106/76 | HR 96 | Ht 66.0 in | Wt 188.4 lb

## 2020-10-23 DIAGNOSIS — G4734 Idiopathic sleep related nonobstructive alveolar hypoventilation: Secondary | ICD-10-CM

## 2020-10-23 DIAGNOSIS — M545 Low back pain, unspecified: Secondary | ICD-10-CM | POA: Diagnosis not present

## 2020-10-23 DIAGNOSIS — I480 Paroxysmal atrial fibrillation: Secondary | ICD-10-CM | POA: Diagnosis not present

## 2020-10-23 DIAGNOSIS — E222 Syndrome of inappropriate secretion of antidiuretic hormone: Secondary | ICD-10-CM

## 2020-10-23 MED ORDER — DICLOFENAC SODIUM 1 % EX GEL: 2.0000 g | Freq: Four times a day (QID) | CUTANEOUS | 1 refills | Status: DC

## 2020-10-23 NOTE — Progress Notes (Addendum)
SUBJECTIVE:   CHIEF COMPLAINT / HPI:  5/1-5/6 Recommendations for Outpatient Follow-up:  1. PCP: Follow-up with PCP in 1 week. 2. Follow up LABS/TEST: Repeat BMP, discuss changing Xanax or discontinuing gradually 3. Discussed anticoagulation as patient is at risk for recurrent fall. 4. Needs sleep apnea assessment.  Positive overnight hypoxia. D/c'ed with oxygen?  5. New Meds: Urea packet for 7 days, bleeding supplement, Mucinex, including patch, Protonix, thiamine 6. Changed meds: Lopressor dose, gabapentin dose 7. Stopped meds: Omeprazole, Seroquel (already on Abilify)  See A/P   Patient presents with her sister today.  Her sister takes notes throughout the exam and seems to function as role of caretaker.  She does not live with the patient.  Patient sister will be coming to all of her future visits with her.   OBJECTIVE:   BP 106/76   Pulse 96   Ht 5\' 6"  (1.676 m)   Wt 188 lb 6.4 oz (85.5 kg)   SpO2 93%   BMI 30.41 kg/m   General: Well-appearing, no acute distress Lungs: speaking in full sentences, no respiratory distress  MSK: normal gait. Psych: Patient is well groomed with good hygiene.  Speech is normal with normal rate, latency, volume and intonation.  Normal eye contact. She focuses on her back pain and need for pain medications multiple times during the encounter. Her patience seems short and she did not seem to want to engage in discussions of her other medical issues.  Neuro: alert and oriented. No gross FND.   ASSESSMENT/PLAN:  65 year old female with multiple medical issues including history of alcohol abuse (last use March 2022), chronic benzodiazepine use, depression and possibly other psychiatric issues that we do not have any record of (follows with Dr. Toy Care) who presents for ED follow-up.  She has been hospitalized 3 times since the end of March for hyponatremia.  Her outpatient follow-up has been very limited with multiple appointment  cancellations/no-shows. Appears CCM is also involved in her case.  Given her multiple issues today, I have suggested that she come back for frequent visits to address and ensure that she has proper follow-up with the appropriate resources.  Sister reports that she will be presenting with her for her follow-up exams.  SIADH. Recurrent hyponatremia Current presentation likely multifactorial in the setting of SIADH history as well as polydipsia and medication side effect.  Gradual improvement while in the hospital.  Multiple medications were taken off of patient's medication list, though is unclear which medication she was taking.  It does not seem that there was a confirmed medication reconciliation.  Today, it does not seem that patient knows what medication she is supposed to be taking.  Her sister is present with her and does not know either.  They did bring her medications that she is taking today.  Her pill bottles indicate that she is taking esomeprazole, metoprolol, pantoprazole, xanax, gabapentin.  She reports she forgot her Eliquis bottle at home. Cause of SIADH is still unknown.  CT scan of lungs in hospital ruled out lung cancer.  Currently working on reducing medications that may cause.  If no improvement with discontinuation of medications, will need further work-up. -We will check labs today -Instructions for follow-up with Dr. Toy Care to obtain official psychiatric med list and bring to next visit - did not start urea  History of alcohol abuse. Nutrition  - last use in March  - thiamine, ensure, MVI  Depression. Mood disorder. Patient is on Xanax, was asked to  gradually reduce it but has not had a follow-up with Psych provider. Currently taking 1 mg twice daily. Unclear what psych medicaitons that patient is taking. She was discharged with directions to continue seroquel, but it is not with her medications today.  She has had [facility] admissions for ETOH and benzo detox in 2013 and 2014.  -  Follow up with Dr. Toy Care   Compression fracture Multiple seen on CT scan. Age-indeterminate compression deformities involving the superior endplates of O96, E95, and T12. Patient reporting back pain today.  She does not appear to have any difficulties in her gait.  She asks multiple times during the appointment for pain medication.  Unfortunately, we did not have enough time to address this issue today.  She has multiple issues that she will need to be closely followed up with.  Patient did not get set up with home health PT at discharge.  Patient continues to wean off of Xanax and has history of alcohol abuse.  She continues to ask for pain medications; I do not think that she would be a good candidate for narcotics given her history of addiction. -Outpatient physical therapy referral placed today  -Follow-up at next appointment for further evaluation.    Paroxysmal A. fib Patient with new onset atrial fibrillation on 09/09/2020 admission.  Cardiology was consulted and she was discharged on metoprolol and Eliquis.  She has not followed up with cardiology outpatient.  She reports that she continues her Eliquis and metoprolol - needs f/u with cardiology   Overnight hypoxia. Overnight seizure-like event.  Seizure ruled out. On 5/4 night patient had an event where she had some seizure-like activity without any loss of control bowel or bladder followed by confusion. Patient reports that she continues to use home oxygen at night. - ordering PSG    Gabapentin??  Notes previously show on this for chronic pain?  Recommend weaning and discontinuing.   Wilber Oliphant, MD [place]

## 2020-10-23 NOTE — Patient Instructions (Addendum)
  SIADH. Recurrent hyponatremia We'll need a list of medications from Dr. Toy Care.  Has been decreasing medications Check labs today  Needs work up for SIADH   Compression fracture - voltaren gel to pharamacy  - follow up in clinic to further evaluation  - outpatient physical therapy needs to be ordered   Paroxysmal A. fib - needs f/u with cardiology,referral placed   Overnight hypoxia. Overnight seizure-like event.  Seizure ruled out. - continue home oxygen  - ordering sleep study, someone will call to schedule   Depression. Mood disorder. Continue decreasing xanax at home  - Follow up with Dr. Toy Care- we'll need a medication list

## 2020-10-24 ENCOUNTER — Telehealth: Payer: Self-pay | Admitting: *Deleted

## 2020-10-24 LAB — BASIC METABOLIC PANEL
BUN/Creatinine Ratio: 8 — ABNORMAL LOW (ref 12–28)
BUN: 5 mg/dL — ABNORMAL LOW (ref 8–27)
CO2: 25 mmol/L (ref 20–29)
Calcium: 9.7 mg/dL (ref 8.7–10.3)
Chloride: 93 mmol/L — ABNORMAL LOW (ref 96–106)
Creatinine, Ser: 0.65 mg/dL (ref 0.57–1.00)
Glucose: 83 mg/dL (ref 65–99)
Potassium: 4.5 mmol/L (ref 3.5–5.2)
Sodium: 134 mmol/L (ref 134–144)
eGFR: 99 mL/min/{1.73_m2} (ref >59)

## 2020-10-24 MED ORDER — INCRUSE ELLIPTA 62.5 MCG/INH IN AEPB: 1.0000 | INHALATION_SPRAY | Freq: Every day | RESPIRATORY_TRACT | 0 refills | Status: DC

## 2020-10-24 MED ORDER — PANTOPRAZOLE SODIUM 40 MG PO TBEC: 40.0000 mg | DELAYED_RELEASE_TABLET | Freq: Every day | ORAL | 0 refills | Status: DC

## 2020-10-24 MED ORDER — ESOMEPRAZOLE MAGNESIUM 40 MG PO CPDR: 40.0000 mg | DELAYED_RELEASE_CAPSULE | Freq: Every day | ORAL | 0 refills | Status: DC

## 2020-10-24 NOTE — Chronic Care Management (AMB) (Signed)
  Care Management   Note  10/24/2020 Name: Karen Casey MRN: [id number] DOB: Sep 12, 1955  Karen Casey is a 65 y.o. year old female who is a primary care patient of Pray, Norwood Levo, MD and is actively engaged with the care management team. I reached out to Karen Casey by phone today to assist with scheduling a follow up visit with the RN Case Manager  Follow up plan: Unsuccessful telephone outreach attempt made. The care management team will reach out to the patient again over the next 7 days. If patient returns call to provider office, please advise to call [place] at [phone].  [place] Management

## 2020-10-24 NOTE — Addendum Note (Signed)
Addended by: Wilber Oliphant on: 10/24/2020 09:31 AM   Modules accepted: Orders

## 2020-10-26 NOTE — Progress Notes (Signed)
Primary Physician/Referring:  [name], MD  Patient ID: Karen Casey, female    DOB: 03/12/1956, 65 y.o.   MRN: [id number]  Chief Complaint  Patient presents with  . Atrial Fibrillation  . Coronary Artery Disease  . Shortness of Breath  . Chest Pain   HPI:    Karen Casey  is a 65 y.o.  Caucasian female with history of anxiety and depression with chronic Xanax use, alcohol abuse, tobacco dependence with 43-pack-year history.  Patient denies history of hyperlipidemia, MI, CVA/TIA, DVT/PE, diabetes.  Patient admitted 09/10/2020 - 09/22/2020 with alcohol withdrawal and acute hypoxic respiratory failure.  During this hospitalization we were consulted for new onset persistent atrial fibrillation, she subsequently spontaneously converted to normal sinus rhythm and was discharged on anticoagulation for 4 weeks for thromboembolic prophylaxis.  Following discharge on 09/22/2020 patient was again admitted 09/25/2020 - 09/28/2020 as well as 10/12/2020 - 10/17/2020 for hyponatremia both times. Patient now presents for follow up of atrial fibrillation.  Review of records reveals no recurrence of atrial fibrillation during recent hospitalizations.  Patient denies palpitations, syncope, near syncope, dizziness, dyspnea.  She does report occasional brief episodes of intermittent left-sided and substernal chest pain which occurs primarily at rest and lasts anywhere from minutes to hours.   Patient has thankfully refrained from alcohol use, however she does continue to smoke half a pack of cigarettes per day.  Notably patient lives a relatively inactive life.  She has continued Eliquis, and is tolerating this without bleeding diathesis.  Past Medical History:  Diagnosis Date  . Alcohol abuse   . Allergy   . Anxiety   . Cataract 06/09/2012   Right eye and left eye  . Depression   . GERD (gastroesophageal reflux disease)   . 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 Seizures  ([place])    xanax withdrawl- December 2013  . Urinary incontinence 06/09/2012   Past Surgical History:  Procedure Laterality Date  . APPENDECTOMY    . CATARACT EXTRACTION  06/09/2012   Left eye  . left shoulder dislocation  Sept 2011   Family History  Problem Relation Age of Onset  . Hypertension Mother   . Hyperlipidemia Mother   . Aneurysm Mother        Rupture - Cause of death  . Heart disease Father        MI - cause of death  . Depression Father   . Parkinsonism Father   . Colon cancer Neg Hx   . Esophageal cancer Neg Hx   . Rectal cancer Neg Hx   . Stomach cancer Neg Hx     Social History   Tobacco Use  . Smoking status: Current Every Day Smoker    Packs/day: 1.00    Years: 43.00    Pack years: 43.00    Types: Cigarettes  . Smokeless tobacco: Never Used  Substance Use Topics  . Alcohol use: Yes    Alcohol/week: 3.0 standard drinks    Types: 3 Cans of beer per week    Comment: 2-3 times   Marital Status: Single   ROS  Review of Systems  Constitutional: Negative for malaise/fatigue and weight gain.  Cardiovascular: Positive for chest pain (intermittent). Negative for claudication, leg swelling, near-syncope, orthopnea, palpitations, paroxysmal nocturnal dyspnea and syncope.  Respiratory: Negative for shortness of breath.   Hematologic/Lymphatic: Does not bruise/bleed easily.  Gastrointestinal: Negative for melena.  Neurological: Negative for dizziness and weakness.  Objective  Blood pressure 116/80, pulse (!) 114, temperature 97.9 F (36.6 C), height 5\' 6"  (1.676 m), weight 185 lb (83.9 kg), SpO2 94 %.  Vitals with BMI 10/27/2020 10/23/2020 10/17/2020  Height 5\' 6"  5\' 6"  -  Weight 185 lbs 188 lbs 6 oz -  BMI 16.10 96.04 -  Systolic 540 981 191  Diastolic 80 76 90  Pulse 478 96 87  Some encounter information is confidential and restricted. Go to Review Flowsheets activity to see all data.      Physical Exam Vitals reviewed.  HENT:     Head:  Normocephalic and atraumatic.  Cardiovascular:     Rate and Rhythm: Regular rhythm. Tachycardia present. Frequent extrasystoles are present.    Pulses: Intact distal pulses.     Heart sounds: S1 normal and S2 normal. No murmur heard. No gallop.   Pulmonary:     Effort: Pulmonary effort is normal. No respiratory distress.     Breath sounds: No wheezing, rhonchi or rales.  Abdominal:     General: Bowel sounds are normal. There is no distension.     Palpations: Abdomen is soft.  Musculoskeletal:     Right lower leg: No edema.     Left lower leg: No edema.  Skin:    General: Skin is warm and dry.  Neurological:     Mental Status: She is alert.     Laboratory examination:   Recent Labs    06/20/20 1431 09/09/20 2147 10/16/20 0717 10/16/20 1652 10/17/20 0313 10/23/20 1140  NA 138   < > 129* 128* 129* 134  K 3.9   < > 4.7 4.4 4.5 4.5  CL 100   < > 89* 89* 92* 93*  CO2 25   < > 28 29 26 25   GLUCOSE 97   < > 112* 112* 117* 83  BUN 4*   < > 18 22 26* 5*  CREATININE 0.72   < > 0.37* 0.45 0.57 0.65  CALCIUM 9.2   < > 9.7 9.5 9.3 9.7  GFRNONAA 90   < > >60 >60 >60  --   GFRAA 104  --   --   --   --   --    < > = values in this interval not displayed.   estimated creatinine clearance is 79.5 mL/min (by C-G formula based on SCr of 0.65 mg/dL).  CMP Latest Ref Rng & Units 10/23/2020 10/17/2020 10/16/2020  Glucose 65 - 99 mg/dL 83 117(H) 112(H)  BUN 8 - 27 mg/dL 5(L) 26(H) 22  Creatinine 0.57 - 1.00 mg/dL 0.65 0.57 0.45  Sodium 134 - 144 mmol/L 134 129(L) 128(L)  Potassium 3.5 - 5.2 mmol/L 4.5 4.5 4.4  Chloride 96 - 106 mmol/L 93(L) 92(L) 89(L)  CO2 20 - 29 mmol/L 25 26 29   Calcium 8.7 - 10.3 mg/dL 9.7 9.3 9.5  Total Protein 6.5 - 8.1 g/dL - - -  Total Bilirubin 0.3 - 1.2 mg/dL - - -  Alkaline Phos 38 - 126 U/L - - -  AST 15 - 41 U/L - - -  ALT 0 - 44 U/L - - -   CBC Latest Ref Rng & Units 10/16/2020 10/12/2020 09/27/2020  WBC 4.0 - 10.5 K/uL 6.9 8.1 7.5  Hemoglobin 12.0 - 15.0  g/dL 13.4 15.2(H) 14.0  Hematocrit 36.0 - 46.0 % 39.7 43.3 40.9  Platelets 150 - 400 K/uL 136(L) 183 452(H)    Lipid Panel Recent Labs    09/17/20 0208  CHOL 97  TRIG 68  LDLCALC 42  VLDL 14  HDL 41  CHOLHDL 2.4    HEMOGLOBIN A1C Lab Results  Component Value Date   HGBA1C 5.2 09/17/2020   MPG 102.54 09/17/2020   TSH Recent Labs    09/12/20 0949 09/25/20 1429 10/12/20 2244  TSH 0.827 2.088 2.413    External labs:   None   Medications and allergies  No Known Allergies   Outpatient Medications Prior to Visit  Medication Sig Dispense Refill  . acetaminophen (TYLENOL) 500 MG tablet Take 1,000 mg by mouth every 6 (six) hours as needed for mild pain.    Marland Kitchen ALPRAZolam (XANAX) 1 MG tablet Take 2 tablets (2 mg total) by mouth 2 (two) times daily. (Patient taking differently: Take 1 mg by mouth 2 (two) times daily.) 56 tablet 0  . apixaban (ELIQUIS) 5 MG TABS tablet Take 1 tablet (5 mg total) by mouth 2 (two) times daily. 60 tablet 0  . diclofenac Sodium (VOLTAREN) 1 % GEL Apply 2 g topically 4 (four) times daily. 50 g 1  . feeding supplement (ENSURE ENLIVE / ENSURE PLUS) LIQD Take 237 mLs by mouth 2 (two) times daily between meals. 10000 mL 0  . fluticasone (FLONASE) 50 MCG/ACT nasal spray SHAKE LIQUID AND USE 2 SPRAYS IN EACH NOSTRIL DAILY (Patient taking differently: Place 2 sprays into both nostrils daily as needed for allergies or rhinitis.) 16 g 0  . gabapentin (NEURONTIN) 600 MG tablet Take 2 tablets (1,200 mg total) by mouth 3 (three) times daily. 180 tablet 0  . Multiple Vitamins-Minerals (CENTRUM SILVER 50+WOMEN PO) Take 1 tablet by mouth daily.    . nicotine (NICODERM CQ - DOSED IN MG/24 HOURS) 21 mg/24hr patch Place 1 patch (21 mg total) onto the skin daily. 28 patch 0  . pantoprazole (PROTONIX) 40 MG tablet Take 1 tablet (40 mg total) by mouth daily. 30 tablet 0  . Probiotic Product (PROBIOTIC PO) Take 1 capsule by mouth daily.    Marland Kitchen thiamine 100 MG tablet Take 1  tablet (100 mg total) by mouth daily. 30 tablet 0  . umeclidinium bromide (INCRUSE ELLIPTA) 62.5 MCG/INH AEPB Inhale 1 puff into the lungs daily. 30 each 0  . metoprolol tartrate (LOPRESSOR) 25 MG tablet Take 1 tablet (25 mg total) by mouth 2 (two) times daily. 60 tablet 0  . guaiFENesin (MUCINEX) 600 MG 12 hr tablet Take 1 tablet (600 mg total) by mouth 2 (two) times daily. 30 tablet 0   No facility-administered medications prior to visit.     Radiology:   No results found.  Cardiac Studies:   Echocardiogram 09/10/2020: 1. Left ventricular ejection fraction, by estimation, is 60 to 65%. The left ventricle has normal function. The left ventricle has no regional wall motion abnormalities. There is mild left ventricular hypertrophy. Left ventricular diastolic parameters were normal.  2. Right ventricular systolic function is normal. The right ventricular size is normal. There is normal pulmonary artery systolic pressure.  3. The mitral valve is normal in structure. Trivial mitral valve regurgitation. No evidence of mitral stenosis.  4. The aortic valve is tricuspid. Aortic valve regurgitation is not visualized. No aortic stenosis is present.  5. The inferior vena cava is normal in size with greater than 50%  respiratory variability, suggesting right atrial pressure of 3 mmHg.   EKG:   10/27/2020: Sinus tachycardia with 2 PACs rate of 90 bpm.  Left atrial enlargement.  Left axis, left anterior fascicular block.  Incomplete  right bundle branch block.  No evidence of ischemia or underlying injury pattern.  09/15/2020: Atrial fibrillation with rapid ventricular response at a rate of 119 bpm. Normal axis.  Assessment     ICD-10-CM   1. Paroxysmal atrial fibrillation (HCC)  I48.0 EKG 12-Lead    LONG TERM MONITOR (3-14 DAYS)  2. Precordial pain  R07.2 PCV MYOCARDIAL PERFUSION WITH LEXISCAN     Medications Discontinued During This Encounter  Medication Reason  . metoprolol tartrate  (LOPRESSOR) 25 MG tablet     Meds ordered this encounter  Medications  . metoprolol tartrate (LOPRESSOR) 25 MG tablet    Sig: Take 2 tablets (50 mg total) by mouth 2 (two) times daily.    Dispense:  120 tablet    Refill:  3  This patients CHA2DS2-VASc Score 1 (F) and yearly risk of stroke 0.6%.   Recommendations:   Karen Casey is a 65 y.o. Caucasian female with history of anxiety and depression with chronic Xanax use, alcohol abuse, tobacco dependence with 43-pack-year history.  Patient denies history of hyperlipidemia, MI, CVA/TIA, DVT/PE, diabetes.  Patient admitted 09/10/2020 - 09/22/2020 with alcohol withdrawal and acute hypoxic respiratory failure.  During this hospitalization we were consulted for new onset persistent atrial fibrillation, she subsequently spontaneously converted to normal sinus rhythm and was discharged on anticoagulation for 4 weeks for thromboembolic prophylaxis.  Following discharge on 09/22/2020 patient was again admitted 09/25/2020 - 09/28/2020 as well as 10/12/2020 - 10/17/2020 for hyponatremia both times. Patient now presents for follow up of atrial fibrillation.  No known recurrence of atrial fibrillation since hospitalization for alcohol withdrawal and acute hypoxic respiratory failure in March 2022.  We will obtain 2-week cardiac monitor to evaluate for recurrence of atrial fibrillation, however if she has no A. fib noted on monitor would recommend discontinuation of anticoagulation given relatively low thromboembolic risk.  At this time shared decision was to continue Eliquis as she is tolerating anticoagulation without bleeding diathesis.  Patient does complain of atypical chest pain, however in the setting of recently diagnosed atrial fibrillation and cardiovascular risk factors would recommend stress test at this time.  Patient remains imbalanced and therefore is not a candidate for treadmill stress test.   Patient's EKG today revealed sinus tachycardia with PACs,  will increase metoprolol titrate to 50 mg twice daily.  Advised patient regarding signs and symptoms of hypotension and bradycardia which would warrant urgent evaluation, she verbalized understanding agreement.  Follow-up in 6 weeks, sooner if needed, for atrial fibrillation, tachycardia, and cardiac testing results.   Alethia Berthold, PA-C 10/27/2020, 5:14 PM Office: [phone]

## 2020-10-27 ENCOUNTER — Ambulatory Visit: Payer: 59 | Admitting: Student

## 2020-10-27 ENCOUNTER — Other Ambulatory Visit: Payer: Self-pay

## 2020-10-27 ENCOUNTER — Encounter: Payer: Self-pay | Admitting: Student

## 2020-10-27 VITALS — BP 116/80 | HR 114 | Temp 97.9°F | Ht 66.0 in | Wt 185.0 lb

## 2020-10-27 DIAGNOSIS — R072 Precordial pain: Secondary | ICD-10-CM

## 2020-10-27 DIAGNOSIS — I480 Paroxysmal atrial fibrillation: Secondary | ICD-10-CM

## 2020-10-27 MED ORDER — METOPROLOL TARTRATE 25 MG PO TABS: 50.0000 mg | ORAL_TABLET | Freq: Two times a day (BID) | ORAL | 3 refills | Status: DC

## 2020-10-29 ENCOUNTER — Telehealth: Payer: Self-pay

## 2020-10-29 NOTE — Telephone Encounter (Signed)
Patient LVM on nurse line requesting to speak with nurse/provider regarding continued use of oxygen tank and when she should stop using home oxygen.   Attempted to call patient. No answer, no ability to LVM.   Talbot Grumbling, RN

## 2020-10-30 NOTE — Chronic Care Management (AMB) (Signed)
  Care Management   Note  10/30/2020 Name: TINE MABEE MRN: [id number] DOB: August 01, 1955  [name] is a 65 y.o. year old female who is a primary care patient of Pray, Norwood Levo, MD and is actively engaged with the care management team. I reached out to [name] by phone today to assist with scheduling a follow up visit with the RN Case Manager  Follow up plan: Patient declines further follow up and engagement by the care management team. Appropriate care team members and provider have been notified via electronic communication.    [place] Management

## 2020-10-31 ENCOUNTER — Other Ambulatory Visit: Payer: Self-pay | Admitting: Family Medicine

## 2020-10-31 ENCOUNTER — Telehealth: Payer: Self-pay | Admitting: Family Medicine

## 2020-10-31 DIAGNOSIS — F419 Anxiety disorder, unspecified: Secondary | ICD-10-CM

## 2020-10-31 MED ORDER — ALPRAZOLAM 1 MG PO TABS: 2.0000 mg | ORAL_TABLET | Freq: Two times a day (BID) | ORAL | 0 refills | Status: AC

## 2020-10-31 NOTE — Telephone Encounter (Signed)
Called and spoke with patient this morning. She confirms that she has run out of her Xanax, she notes she was taking 6 tabs daily, denies sharing or selling them. I reviewed her PDMP and she was given 150 tabs from her psychiatrist on on 10/06/2020, if taking 6 per day she would have 25 days worth which would run out today, she states she has been out for 3 days. We discussed that she was taking higher than prescribed dosage which is not acceptable. WE also discussed the risk of her withdrawing and having seizures if she stops and runs out of her Xanax, she states she is aware but she talked with her psychiatrist office which prescribes this and they said they could not refill until they saw her hospital records, she has called for these records but hasn't heard back from them. She also mentions wanting to discuss her back pain which I discussed will need to be at her next appt on 5/26 with me next week.   Due to her recent hospitalization for benzo withdrawal seizures, I feel the risk of her withdrawing is high even though she has not been taking her scripts appropriately. As such I will give her 8 day refill for 2mg  BID of Xanax (as she she inappropriately taking 3 mg BID prior). I discussed that I am only giving her enough to get to her next appt where we will discuss a very strict taper and that she will need to decrease the amount she is taking because we will need to get her off of the Xanax completely. She endorsed understanding of this plan and was aware of the date and time of her appt. ED return precuations discusssed, I confirmed her pharmacy and sent the Xanax to her pharmacy and again discussed taking it exactly as prescribed, which she voiced understanding.   Due to her history of withdrawal seizures and inappropriate taking of more than prescribed, I feel even more strongly than ever that we must continue to wean her Xanax and get her off of this completely, and I told her this.  Yehuda Savannah  MD

## 2020-11-04 DIAGNOSIS — F19239 Other psychoactive substance dependence with withdrawal, unspecified: Secondary | ICD-10-CM | POA: Insufficient documentation

## 2020-11-04 DIAGNOSIS — F19939 Other psychoactive substance use, unspecified with withdrawal, unspecified: Secondary | ICD-10-CM | POA: Insufficient documentation

## 2020-11-04 DIAGNOSIS — R569 Unspecified convulsions: Secondary | ICD-10-CM

## 2020-11-04 HISTORY — DX: Other psychoactive substance use, unspecified with withdrawal, unspecified: F19.939

## 2020-11-04 HISTORY — DX: Unspecified convulsions: R56.9

## 2020-11-04 NOTE — Progress Notes (Signed)
SUBJECTIVE:   CHIEF COMPLAINT / HPI:   Karen Casey is a pleasant 65 yo female who presents for follow-up for multiple chronic issues.  SIADH/hyponatremia- most recent Na 134 at follow-up appointment two weeks ago. Out of her urea tabs, no longer taking Cymbalta which was thought to be the culprit. Denies confusion  Back pain- age indeterminate compression fractures T10-12 noted on CT during recent hospital stay. She is having lots of pain limiting her ability to get around. Voltaren gel did not help. Taking tylenol 500 mg two times a day. Denies fevers, chills, bowel or bladder incontinence, saddle anesthesias.  Thoracic vertebral compression fractures- discussed this means she has low bone density. Discussed starting vit D and calcium, and will need treatment for osteoporosis going forward to prevent future fractures.  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 She notes her anxiety is worse and now that her Cymbalta has been stopped she feels she has nothing for her mood. She is okay with me calling her psychiatrist to discuss other treatment options. She has two Xanax tabs left for this evening.   Atrial fibrillation, paroxysmal- recently seen by cardiology on 10/27/2020- increased to metoprolol tartrate 50mg  BID, she feels fine on this.  She is on Eliquis, has not fallen, not noticing blood in stools.  CHADS2VASC score 1, HASBLED 0 (would be 1 if she resumed drinking). They are ordered a cardiac monitor- if no signs of Afib will likely discontinue anticoagulation due to her low scores.  Alcohol use disorder- has not drank since last week, had half a Mikes hard lemonade and it tasted bad so she stopped. Was previously drinking to help with pain and anxiety.  Atypical chest pain-  scheduled for Lexiscan stress test on 11/17/2020.  HCM- needs mammogram scheduled. Needs DXA scan to monitor treatment for osteoporosis.  PERTINENT  PMH / PSH: benzodiazepine withdrawal seizures.  OBJECTIVE:   BP 116/80   Pulse 84   Ht 5\' 6"  (1.676 m)   Wt 186 lb (84.4 kg)   SpO2 93%   BMI 30.02 kg/m   General: A&O, NAD HEENT: No sign of trauma, EOM grossly intact Cardiac: RRR, no m/r/g Respiratory: CTAB, normal WOB, no w/c/r GI: Soft, NTTP, non-distended  Extremities: NTTP, no peripheral edema. Strength 5/5 in bilaterall lower extremities, 2+ patellar DTRs bilaterally. Sensation intact bilaterally. Neuro: Normal gait, moves all four extremities appropriately. Psych: Appropriate mood and affect   ASSESSMENT/PLAN:   Paroxysmal atrial fibrillation ([place]) - following with cardiology, completing 2 week monitor, if minimal Afib or none per cardiology may discontinue Eliquis - Karen Madore states this medication was $200 and she cannot afford, will send message to our pharmacy team to help with patient assistance in the meantime  SIADH (syndrome of inappropriate ADH production) ([place]) - rechecking NA today, she is still off of Cymbalta which was suspected contributing agent  Drug withdrawal seizure with complication ([place]) - given another 1 week script of Xanax 2mg  BID, discussed strict adherence to this and patient has a 1 week follow up with me, already discussed at this time we will decrease to only 3 mg daily and continue to taper 25% every 2-4 weeks until she is off of this - I understand her anxiety and depression has been worse since having to stop Cymbalta  and also weaning Xanax, she gave me permission to call her psychiatrist to discuss other options for her mood, I encouraged her to call as well, she denies SI/HI today  Compression fracture of body of thoracic vertebra (HCC) - calcitonin nasal spray once daily prescribed, continue voltaren gel, discussed 1g Tylenol q8hrs - no red  flag signs or symptoms - after 2-4 weeks calcitonin spray will need to start bisphosphenates for osteoporosis treatment and order DXA for baseline T scores  Alcohol use - continued abstinence, one drink last week, reiterated importance of continuing to avoid and risk of death, liver disease, and seizures with starting to drink again  Anxiety disorder - 1 week Xanax given with continued taper plan, will discuss with psychiatrist other options for treatment going forward  Other chest pain - stress test scheduled with cardiology in 2 weeks, ED return precautions discussed    [name], MD [place]

## 2020-11-06 ENCOUNTER — Other Ambulatory Visit: Payer: Self-pay

## 2020-11-06 ENCOUNTER — Ambulatory Visit (INDEPENDENT_AMBULATORY_CARE_PROVIDER_SITE_OTHER): Payer: 59 | Admitting: Family Medicine

## 2020-11-06 ENCOUNTER — Encounter: Payer: Self-pay | Admitting: Family Medicine

## 2020-11-06 VITALS — BP 116/80 | HR 84 | Ht 66.0 in | Wt 186.0 lb

## 2020-11-06 DIAGNOSIS — R0789 Other chest pain: Secondary | ICD-10-CM

## 2020-11-06 DIAGNOSIS — F19239 Other psychoactive substance dependence with withdrawal, unspecified: Secondary | ICD-10-CM

## 2020-11-06 DIAGNOSIS — I480 Paroxysmal atrial fibrillation: Secondary | ICD-10-CM | POA: Diagnosis not present

## 2020-11-06 DIAGNOSIS — E222 Syndrome of inappropriate secretion of antidiuretic hormone: Secondary | ICD-10-CM

## 2020-11-06 DIAGNOSIS — S22000A Wedge compression fracture of unspecified thoracic vertebra, initial encounter for closed fracture: Secondary | ICD-10-CM | POA: Insufficient documentation

## 2020-11-06 DIAGNOSIS — R569 Unspecified convulsions: Secondary | ICD-10-CM

## 2020-11-06 DIAGNOSIS — Z789 Other specified health status: Secondary | ICD-10-CM

## 2020-11-06 DIAGNOSIS — F19939 Other psychoactive substance use, unspecified with withdrawal, unspecified: Secondary | ICD-10-CM

## 2020-11-06 DIAGNOSIS — F419 Anxiety disorder, unspecified: Secondary | ICD-10-CM

## 2020-11-06 DIAGNOSIS — Z7289 Other problems related to lifestyle: Secondary | ICD-10-CM

## 2020-11-06 MED ORDER — CALCITONIN (SALMON) 200 UNIT/ACT NA SOLN: 1.0000 | Freq: Every day | NASAL | 0 refills | Status: DC

## 2020-11-06 MED ORDER — ALPRAZOLAM 1 MG PO TABS: 2.0000 mg | ORAL_TABLET | Freq: Two times a day (BID) | ORAL | 0 refills | Status: DC

## 2020-11-06 NOTE — Patient Instructions (Signed)
It was wonderful to see you today.  Please bring ALL of your medications with you to every visit.   Today we talked about:  - Sent calcitonin nasal spray 1 spray daily to pharmacy for vertebral fractures, you can also increase Tylenol to 1000mg  three times a day as needed for pain and use the voltaren gel - I have sent a one week refill for your Xanax 2mg  twice daily, when I see you next week we will decrease to 1mg  at night and 2mg  in morning - I will call your psychiatrist to discuss a different medicine for your depression and anxiety - We will check your vitamin D levels and sodium today, I will call with results - Start taking 1200mg  calcium daily and 2000 IU of vitamin D daily - we will need to start other treatments for your osteoporosis (thin bones) at future appointments   Thank you for choosing [facility].   Please call [phone] with any questions about today's appointment.  Please be sure to schedule follow up at the front  desk before you leave today.   Yehuda Savannah, MD  Family Medicine

## 2020-11-07 ENCOUNTER — Telehealth: Payer: Self-pay

## 2020-11-07 NOTE — Assessment & Plan Note (Signed)
-   given another 1 week script of Xanax 2mg  BID, discussed strict adherence to this and patient has a 1 week follow up with me, already discussed at this time we will decrease to only 3 mg daily and continue to taper 25% every 2-4 weeks until she is off of this - I understand her anxiety and depression has been worse since having to stop Cymbalta and also weaning Xanax, she gave me permission to call her psychiatrist to discuss other options for her mood, I encouraged her to call as well, she denies SI/HI today

## 2020-11-07 NOTE — Assessment & Plan Note (Signed)
-   1 week Xanax given with continued taper plan, will discuss with psychiatrist other options for treatment going forward

## 2020-11-07 NOTE — Assessment & Plan Note (Signed)
-   stress test scheduled with cardiology in 2 weeks, ED return precautions discussed

## 2020-11-07 NOTE — Telephone Encounter (Signed)
Patient calls nurse line requesting to speak with PCP. Patient reports she seen yesterday in our office and now is experiencing "cold" symptoms. Patient reports body aches, congestion, wheezing, and a tmax of 101 since last night. Patient has been taking Tylenol as a fever reducer and to help with body aches. Patient also stated the "nasal spray" that was given to her is not working for her back pain. Will forward to PCP. Patient stated, "she was supposed to cal me today anyway." Will forward to PCP.

## 2020-11-07 NOTE — Assessment & Plan Note (Signed)
-   continued abstinence, one drink last week, reiterated importance of continuing to avoid and risk of death, liver disease, and seizures with starting to drink again

## 2020-11-07 NOTE — Assessment & Plan Note (Signed)
-   calcitonin nasal spray once daily prescribed, continue voltaren gel, discussed 1g Tylenol q8hrs - no red flag signs or symptoms - after 2-4 weeks calcitonin spray will need to start bisphosphenates for osteoporosis treatment and order DXA for baseline T scores

## 2020-11-07 NOTE — Assessment & Plan Note (Signed)
-   following with cardiology, completing 2 week monitor, if minimal Afib or none per cardiology may discontinue Eliquis - Ms Fesler states this medication was $200 and she cannot afford, will send message to our pharmacy team to help with patient assistance in the meantime

## 2020-11-07 NOTE — Telephone Encounter (Signed)
Attempted to call patient X2. Mailbox is full, unable to leave message.   If calls back,please direct to urgent care and COVID testing.   Dorris Singh, MD  Family Medicine Teaching Service

## 2020-11-07 NOTE — Assessment & Plan Note (Signed)
-   rechecking NA today, she is still off of Cymbalta which was suspected contributing agent

## 2020-11-08 NOTE — Telephone Encounter (Signed)
Attempted to call again. Unable to leave CM.   Please give message below if calls back, otherwise will await PCP return.  Dorris Singh, MD  Family Medicine Teaching Service

## 2020-11-11 ENCOUNTER — Telehealth: Payer: Self-pay

## 2020-11-11 ENCOUNTER — Telehealth: Payer: Self-pay | Admitting: Family Medicine

## 2020-11-11 DIAGNOSIS — I480 Paroxysmal atrial fibrillation: Secondary | ICD-10-CM

## 2020-11-11 MED ORDER — APIXABAN 5 MG PO TABS: 5.0000 mg | ORAL_TABLET | Freq: Two times a day (BID) | ORAL | 0 refills | Status: DC

## 2020-11-11 NOTE — Telephone Encounter (Signed)
Able to reach patient, please see telephone note.

## 2020-11-11 NOTE — Telephone Encounter (Signed)
Called and spoke to patient, she notes she had two days of low grade fevers, aches, congestion starting Friday 11/07/2020 but resolved on Saturday, she notes now she denies sore throat, fevers, congestion, chest pain, dyspnea. She does have some cough occasionally, but feels this is getting better. She did not get tested for COVID. I did recommend COVID testing although today would be day 5 of illness so she would be out of quarantine period today, thus she can still be seen in our clinic at her scheduled appointment tomorrow.  Discussed her lab work was drawn but has not returned yet. She is alert and oriented x3 over the phone. Will follow up on lab results delay.  Discussed  ED precautions including new chest pain, worsening cough or difficulty breathing, high fever, leg swelling. She notes continued back pain and that the calcitonin nasal spray has not helped much.  Also discussed that Eliquis co-pay card sent in, and she will only have a $10 co-pay.  Reminded her of her 32 AM appointment with myself tomorrow, and return precautions re-iterated. Answered all questions.  Yehuda Savannah MD

## 2020-11-11 NOTE — Telephone Encounter (Signed)
$  10 copay card info  BIN [id number] PCN LOYALTY ID [id number] GRP [id number]

## 2020-11-11 NOTE — Telephone Encounter (Signed)
Pt left a VM asking for assistance for eliquis medication.  I attempted to call in a copay card to [facility] (the last place rx was sent) and they had no refills. Once I spoke to pt she mentioned she now gets her RX's at [facility] on [address]. She paid $200 for the medication last month and said this is too expensive for her due to her being on disability & having a fixed income. I reached out to [facility] to give them the copay card & there was no record of eliquis on file there. Will reach out to Dr. Thompson Grayer to get refills sent. Pharmacist assured me the copay card given will stay on file to be used once rx is rec'd.

## 2020-11-12 ENCOUNTER — Other Ambulatory Visit: Payer: Self-pay

## 2020-11-12 ENCOUNTER — Telehealth: Payer: Self-pay | Admitting: Family Medicine

## 2020-11-12 ENCOUNTER — Ambulatory Visit: Payer: 59 | Admitting: Family Medicine

## 2020-11-12 ENCOUNTER — Telehealth (INDEPENDENT_AMBULATORY_CARE_PROVIDER_SITE_OTHER): Payer: 59 | Admitting: Family Medicine

## 2020-11-12 DIAGNOSIS — I480 Paroxysmal atrial fibrillation: Secondary | ICD-10-CM

## 2020-11-12 DIAGNOSIS — K219 Gastro-esophageal reflux disease without esophagitis: Secondary | ICD-10-CM

## 2020-11-12 DIAGNOSIS — F32A Depression, unspecified: Secondary | ICD-10-CM

## 2020-11-12 DIAGNOSIS — S22000A Wedge compression fracture of unspecified thoracic vertebra, initial encounter for closed fracture: Secondary | ICD-10-CM | POA: Diagnosis not present

## 2020-11-12 DIAGNOSIS — F19939 Other psychoactive substance use, unspecified with withdrawal, unspecified: Secondary | ICD-10-CM

## 2020-11-12 DIAGNOSIS — G8929 Other chronic pain: Secondary | ICD-10-CM

## 2020-11-12 DIAGNOSIS — R569 Unspecified convulsions: Secondary | ICD-10-CM

## 2020-11-12 DIAGNOSIS — E222 Syndrome of inappropriate secretion of antidiuretic hormone: Secondary | ICD-10-CM

## 2020-11-12 DIAGNOSIS — F419 Anxiety disorder, unspecified: Secondary | ICD-10-CM

## 2020-11-12 DIAGNOSIS — N644 Mastodynia: Secondary | ICD-10-CM

## 2020-11-12 DIAGNOSIS — F19239 Other psychoactive substance dependence with withdrawal, unspecified: Secondary | ICD-10-CM

## 2020-11-12 MED ORDER — PANTOPRAZOLE SODIUM 40 MG PO TBEC: 40.0000 mg | DELAYED_RELEASE_TABLET | Freq: Every day | ORAL | 2 refills | Status: DC

## 2020-11-12 MED ORDER — ALPRAZOLAM 1 MG PO TABS: 1.0000 mg | ORAL_TABLET | Freq: Three times a day (TID) | ORAL | 0 refills | Status: DC | PRN

## 2020-11-12 MED ORDER — LIDOCAINE 5 % EX PTCH: 1.0000 | MEDICATED_PATCH | CUTANEOUS | 0 refills | Status: DC

## 2020-11-12 NOTE — Assessment & Plan Note (Addendum)
-   Will taper down to 3mg  daily starting tomorrow, script sent for 45 tabs to cover for 3mg  daily until next appt with me on 11/28/2020, per lit review a taper of 25% every 2-4 weeks appropriate for benzo taper, Karen Casey is aware of the taper, the risk of withdrawal seizure and death if she takes too many and runs out of her Xanax early, and is in agreement with this plan

## 2020-11-12 NOTE — Telephone Encounter (Signed)
Patient called to cancel her appointment this morning. She wanted to let Dr. Thompson Grayer know that it is due to her having a flat tire. Patient said she is not able to reschedule at the moment but would like for Dr. Thompson Grayer to call her.   The best call back is [phone].

## 2020-11-12 NOTE — Assessment & Plan Note (Signed)
-   she is not confused, remains off Cymbalta, labs lost from last week, she states she will come in Friday to repeat BMP, ordered future order today

## 2020-11-12 NOTE — Assessment & Plan Note (Signed)
-   off of Cymbalta, PHQ9 16 today, denies SI/HI, discussed she is in counseling, discussed I will call her psychiatrist to see if she may benefit from another maintenance medication, safety planning includes calling her sister if she feels suicidal thoughts, also discussed suicide hotline and [facility] emergency room

## 2020-11-12 NOTE — Assessment & Plan Note (Signed)
-   no signs of poor control, scheduled for stress test and to wear monitor per cardiology, on Eliquis with no signs/sx of bleeding only small amount of blood when wiping sometimes but none in stool, strict bleeding/return precautions given, with CHADS2VASC of 1 am hopeful she may be able to get off of anticoagulation going forward

## 2020-11-12 NOTE — Progress Notes (Addendum)
[facility] Telemedicine Visit  Patient consented to have virtual visit and was identified by name and date of birth. Method of visit: Telephone  Encounter participants: Patient: Karen Casey - located at home Provider: [name] - located at [facility] clinic Others (if applicable): None  Chief Complaint: follow-up  HPI:  Back pain- still having back pain, on heating pad now which helps. Started the calcitonin once day, has not noticed much difference. Taking Tylenol 8 tabs 500mg  tabs per day, helps some for a couple hours. Denies bowel or bladder incontinence, no saddle paresthesias, no fevers or chills. No NSAIDs as she is on Eliquis.  Anxiety/depression- PHQ-9 16 today, denies SI/HI. Follows with psychiatry Dr Wylene Simmer does counseling, not very helpful she feels. Still off Cymbalta, notes they started hydroxyzine 25mg  QID PRN. Can't remember what else she has tried as far as antidepressants in the past. Started the hydroxyzine two days ago, so far feels like it makes her more tired. For her Xanax, she has only been taking 4 per day, 3 Xanax left, took 1 this AM.   A-fib- denies palpitations, chest pain, no dyspnea, no diaphoresis. No blood in stool, no black stools, no blood in urine. Small amount of blood when she wipes occasionally, when she had a loose stool, last time was last night, but this is not a large amount of persistent.  Breast pain- Does note some breast soreness still, worse when she pushes on her breasts. No exertional chest pain, no pleuritic chest pain. No nipple discharge or lumps or bumps in breasts. She has stress test scheduled for Monday 11/17/2020.  Alcohol use- denies any drinking since last appointment.  PDMP reviewed, appropriate.   Discussed that labs for checking Na and Vit D were drawn last time but lost by [facility].   ROS: per HPI  Pertinent PMHx: as above  Exam:  There were no vitals taken for this visit.  Respiratory:  speaking in complete sentences, no signs of distress  Assessment/Plan:  SIADH (syndrome of inappropriate ADH production) ([place]) - she is not confused, remains off Cymbalta, labs lost from last week, she states she will come in Friday to repeat BMP, ordered future order today  Drug withdrawal seizure with complication ([place]) - Will taper down to 3mg  daily starting tomorrow, script sent for 45 tabs to cover for 3mg  daily until next appt with me on 11/28/2020, per lit review a taper of 25% every 2-4 weeks appropriate for benzo taper, Ms Pellum is aware of the taper, the risk of withdrawal seizure and death if she takes too many and runs out of her Xanax early, and is in agreement with this plan  Compression fracture of body of thoracic vertebra (HCC) - lidocaine patches sent, no red flag symptoms, continue voltaren gel, apap discussed limit to 3g daily, and calcitonin nasal spray - repeat vit D level ordered as labs were lost, she will come to clinic Friday to get it  Anxiety disorder - tapering Xanax, has been started on hydroxyzine per psychiatry, will call and discuss if other treatment options such as atypical antipsychotic without risk of SIADH but that can better treat her anxiety and depression  Depression - off of Cymbalta, PHQ9 16 today, denies SI/HI, discussed she is in counseling, discussed I will call her psychiatrist to see if she may benefit from another maintenance medication, safety planning includes calling her sister if she feels suicidal thoughts, also discussed suicide hotline and [facility] emergency room  Chronic pain of breast - patient notes this pain is under both of her breasts in the lower quadrants, no lumps or masses or discharge - diagnostic mammo and ultrasound ordered, will call patient with the date and time, discussed return precautions  Paroxysmal atrial fibrillation (HCC) - no signs of poor control, scheduled for stress test and to wear monitor per cardiology, on  Eliquis with no signs/sx of bleeding only small amount of blood when wiping sometimes but none in stool, strict bleeding/return precautions given, with CHADS2VASC of 1 am hopeful she may be able to get off of anticoagulation going forward    Time spent during visit with patient: 28 minutes    Addendum: Called 11/13/2020 at 16:50 and updated patient that they found the lab work, Na 136, K 5.3, discussed this is likely hemolysis but she is planning to come in tomorrow 6/3 for labwork so we can repeat BMP then. Clarified that she is only using nasal spray once daily, I received a refill request today but told her I denied it bc she should be covered for a month with that prescription. We discussed return precautions, she states she picked up her Xanax today and other medications but they did not have the lidocaine patches- we will clarify with pharmacy. Answered all questions and concerns.

## 2020-11-12 NOTE — Telephone Encounter (Signed)
Contacted patient and set her up to do a virtual visit due to her not being able to come in today.Karen Casey, CMA

## 2020-11-12 NOTE — Assessment & Plan Note (Addendum)
-   lidocaine patches sent, no red flag symptoms, continue voltaren gel, apap discussed limit to 3g daily, and calcitonin nasal spray - repeat vit D level ordered as labs were lost, she will come to clinic Friday to get it

## 2020-11-12 NOTE — Assessment & Plan Note (Signed)
-   patient notes this pain is under both of her breasts in the lower quadrants, no lumps or masses or discharge - diagnostic mammo and ultrasound ordered, will call patient with the date and time, discussed return precautions

## 2020-11-12 NOTE — Assessment & Plan Note (Signed)
-   tapering Xanax, has been started on hydroxyzine per psychiatry, will call and discuss if other treatment options such as atypical antipsychotic without risk of SIADH but that can better treat her anxiety and depression

## 2020-11-12 NOTE — Assessment & Plan Note (Deleted)
-   Will taper down to 3mg  daily starting tomorrow, script sent for 45 tabs to cover for 3mg  daily until next appt with me on 11/28/2020, per lit review a taper of 25% every 2-4 weeks appropriate for benzo taper, Karen Casey is aware of the taper, the risk of withdrawal seizure and death if she takes too many and runs out of her Xanax early, and is in agreement with this plan

## 2020-11-13 ENCOUNTER — Other Ambulatory Visit: Payer: Self-pay | Admitting: Family Medicine

## 2020-11-13 ENCOUNTER — Other Ambulatory Visit: Payer: Self-pay

## 2020-11-13 DIAGNOSIS — S22000A Wedge compression fracture of unspecified thoracic vertebra, initial encounter for closed fracture: Secondary | ICD-10-CM

## 2020-11-13 MED ORDER — METOPROLOL TARTRATE 25 MG PO TABS: 50.0000 mg | ORAL_TABLET | Freq: Two times a day (BID) | ORAL | 3 refills | Status: DC

## 2020-11-13 NOTE — Addendum Note (Signed)
Addended by: Yehuda Savannah E on: 11/13/2020 04:59 PM   Modules accepted: Orders

## 2020-11-14 LAB — BASIC METABOLIC PANEL
BUN/Creatinine Ratio: 7 — ABNORMAL LOW (ref 12–28)
BUN: 5 mg/dL — ABNORMAL LOW (ref 8–27)
CO2: 19 mmol/L — ABNORMAL LOW (ref 20–29)
Calcium: 9.8 mg/dL (ref 8.7–10.3)
Chloride: 93 mmol/L — ABNORMAL LOW (ref 96–106)
Creatinine, Ser: 0.76 mg/dL (ref 0.57–1.00)
Glucose: 102 mg/dL — ABNORMAL HIGH (ref 65–99)
Potassium: 5.3 mmol/L — ABNORMAL HIGH (ref 3.5–5.2)
Sodium: 136 mmol/L (ref 134–144)
eGFR: 89 mL/min/{1.73_m2} (ref >59)

## 2020-11-14 LAB — VITAMIN D 25 HYDROXY (VIT D DEFICIENCY, FRACTURES)

## 2020-11-17 ENCOUNTER — Other Ambulatory Visit: Payer: 59

## 2020-11-18 ENCOUNTER — Other Ambulatory Visit: Payer: Self-pay | Admitting: Family Medicine

## 2020-11-18 DIAGNOSIS — N644 Mastodynia: Secondary | ICD-10-CM

## 2020-11-20 ENCOUNTER — Telehealth: Payer: Self-pay

## 2020-11-20 NOTE — Telephone Encounter (Signed)
Pt called requested Dr. Thompson Grayer call her back regarding the anti-depressant meds they took her off, her back pain, and the nasal spray that's not working for her.

## 2020-11-21 ENCOUNTER — Telehealth: Payer: Self-pay | Admitting: Family Medicine

## 2020-11-21 ENCOUNTER — Telehealth: Payer: Self-pay

## 2020-11-21 DIAGNOSIS — M545 Low back pain, unspecified: Secondary | ICD-10-CM

## 2020-11-21 NOTE — Telephone Encounter (Signed)
Called patient back to discuss her symptoms.  She reports her breathing is at baseline.  She reports nothing is working for her back pain.  I offered her an earlier appointment for evaluation if she desired.  Discussed options over the weekend including alternating heat and ice.  She also request restarting her Cymbalta.  I discussed that I do not recommend restarting this in light of her significant hyponatremia in discussion with her PCP.  Reviewed reasons to call and return to care including changes in her breathing.

## 2020-11-21 NOTE — Telephone Encounter (Signed)
Received fax from pharmacy, PA needed on Lidocaine patches 5%.  Clinical questions submitted via Cover My Meds.  Waiting on response, could take up to 72 hours.  Cover My Meds info: Key:  [id number]   Talbot Grumbling, RN

## 2020-11-21 NOTE — Telephone Encounter (Signed)
Attempted to call patient X2. Voicemail is full.  If patient calls back, please have patient appointment sooner if symptoms have changed. Otherwise, will address with Dr. Thompson Grayer at their upcoming visit. Discussed with PCP.   Dorris Singh, MD  Family Medicine Teaching Service

## 2020-11-24 ENCOUNTER — Telehealth: Payer: Self-pay

## 2020-11-24 NOTE — Telephone Encounter (Signed)
    Please advise next steps for patient.   Talbot Grumbling, RN

## 2020-11-24 NOTE — Telephone Encounter (Signed)
Patient calls nurse line requesting to speak with Dr. Thompson Grayer. Advised patient that provider is out of the office. Patient then asks to speak to the provider covering for Dr. Thompson Grayer.   Patient reports that she needs to increase her Xanax dosage as she is having bad withdrawal symptoms. Patient reports that she feels like she is fixing to have a seizure. Reports she is having palpitations, nausea and diarrhea. Patient is with her spouse at time of conversation. States that spouse cannot drive her to the hospital for further evaluation. Given that patient does not have transportation to go to the ED, advised patient to call 911 for further evaluation of symptoms. Patient states that she is going to take 2 Xanax to see if that helps her symptoms.   Advised patient against taking more medication that prescribed and reinforced ED recommendation.   To PCP  Talbot Grumbling, RN

## 2020-11-24 NOTE — Telephone Encounter (Signed)
Attempted to call X2. Unable to LVM.   Dorris Singh, MD  Family Medicine Teaching Service

## 2020-11-25 ENCOUNTER — Telehealth: Payer: Self-pay | Admitting: *Deleted

## 2020-11-25 MED ORDER — LIDOCAINE HCL 3 % EX CREA: 1.0000 "application " | TOPICAL_CREAM | CUTANEOUS | 0 refills | Status: DC | PRN

## 2020-11-25 NOTE — Telephone Encounter (Signed)
Formulary alternative to pharmacy.  Dorris Singh, MD  Family Medicine Teaching Service

## 2020-11-25 NOTE — Telephone Encounter (Signed)
I received a call from the sleep disorder center regarding patient's sleep study being denied by [facility].  Will send message to both ordering provider and PCP to update them.  The sleep center will be faxing over the denial letter from the insurance.  Ashland Osmer,CMA

## 2020-11-26 ENCOUNTER — Other Ambulatory Visit: Payer: 59

## 2020-11-28 ENCOUNTER — Other Ambulatory Visit: Payer: Self-pay

## 2020-11-28 ENCOUNTER — Encounter: Payer: Self-pay | Admitting: Family Medicine

## 2020-11-28 ENCOUNTER — Ambulatory Visit (INDEPENDENT_AMBULATORY_CARE_PROVIDER_SITE_OTHER): Payer: 59 | Admitting: Family Medicine

## 2020-11-28 VITALS — BP 102/70 | HR 88 | Wt 181.6 lb

## 2020-11-28 DIAGNOSIS — M545 Low back pain, unspecified: Secondary | ICD-10-CM

## 2020-11-28 DIAGNOSIS — F32A Depression, unspecified: Secondary | ICD-10-CM | POA: Diagnosis not present

## 2020-11-28 DIAGNOSIS — R0789 Other chest pain: Secondary | ICD-10-CM

## 2020-11-28 DIAGNOSIS — F419 Anxiety disorder, unspecified: Secondary | ICD-10-CM

## 2020-11-28 DIAGNOSIS — R569 Unspecified convulsions: Secondary | ICD-10-CM

## 2020-11-28 DIAGNOSIS — S22000A Wedge compression fracture of unspecified thoracic vertebra, initial encounter for closed fracture: Secondary | ICD-10-CM

## 2020-11-28 DIAGNOSIS — I480 Paroxysmal atrial fibrillation: Secondary | ICD-10-CM

## 2020-11-28 DIAGNOSIS — G4700 Insomnia, unspecified: Secondary | ICD-10-CM

## 2020-11-28 DIAGNOSIS — Z7289 Other problems related to lifestyle: Secondary | ICD-10-CM

## 2020-11-28 DIAGNOSIS — F19239 Other psychoactive substance dependence with withdrawal, unspecified: Secondary | ICD-10-CM

## 2020-11-28 DIAGNOSIS — Z789 Other specified health status: Secondary | ICD-10-CM

## 2020-11-28 DIAGNOSIS — F19939 Other psychoactive substance use, unspecified with withdrawal, unspecified: Secondary | ICD-10-CM

## 2020-11-28 MED ORDER — QUETIAPINE FUMARATE 25 MG PO TABS: 25.0000 mg | ORAL_TABLET | Freq: Every day | ORAL | 0 refills | Status: DC

## 2020-11-28 MED ORDER — CALCITONIN (SALMON) 200 UNIT/ACT NA SOLN: 1.0000 | Freq: Every day | NASAL | 0 refills | Status: DC

## 2020-11-28 MED ORDER — LIDOCAINE HCL 3 % EX CREA: 1.0000 "application " | TOPICAL_CREAM | CUTANEOUS | 0 refills | Status: DC | PRN

## 2020-11-28 MED ORDER — ALPRAZOLAM 1 MG PO TABS: 1.0000 mg | ORAL_TABLET | Freq: Three times a day (TID) | ORAL | 0 refills | Status: DC | PRN

## 2020-11-28 NOTE — Patient Instructions (Signed)
It was wonderful to see you today.  Please bring ALL of your medications with you to every visit.   Today we talked about:  - Checking vitamin D today - referral to physical med and rehab for pain control options, also pick up lidocaine cream - refilled Xanax, 3mg  daily MAXIMUM - started small dose seroquel 25mg  every bedtime for depression/anxiety, can make you sleepy, watch for signs of feeling too drowsy and do not take with 2mg  of Xanax in the evenings - refilled the nasal spray to help with  the fractures in your back healing   I will see you back in one week!   Thank you for choosing [facility].   Please call [phone] with any questions about today's appointment.  Please be sure to schedule follow up at the front  desk before you leave today.   Yehuda Savannah, MD  Family Medicine

## 2020-11-28 NOTE — Progress Notes (Signed)
SUBJECTIVE:   CHIEF COMPLAINT / HPI:   Anxiety/depression- weaning Xanax, currently taking 3 mg daily, 2 in Am 1 in PM. Psychiatrist Dr Toy Care- has not been back to see her. Has been taking hydroxyzine. 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 She notes feeling very down and also anxious. When she is anxious her heart races and she worries she will have a seizure. She missed her cardiology appt for a stress test. She notes sometimes she has thoughts of not wanting to be around, but denies every thinking of a plan to hurt herself, or wanting to act on a plan. She describes her sister as supportive, and is able to safety contract.  Back pain, vertebral compression fractures- not well controlled. Taking nasal calcitonin spray daily. Cannot take NSAIDs due to Eliquis for her Afib. Tripped over a rug yesterday and abrasion on her left knee. Did not hit her head or lose consciousness. Still needs to get her vitamin D checked.She denies saddle paresthesias, fevers or chills. She does note she has chronically loose stools and sometimes smears of stool in her underwear that she did not feel, no large episodes of bowel incontinence. No bladder incontinence.  Breast pain- Has breast ultrasound/mammogram scheduled for 01/01/2021.   Atypical chest pain, atrial fib- has cancelled two appointments with cardiology to get her stress test, is rescheduled for 12/08/2020. Sometimes feels palpitations but also when she is anxious. She has not gotten the event monitor yet.  PERTINENT  PMH / PSH: as above  OBJECTIVE:   BP 102/70   Pulse 88   Wt 181 lb 9.6 oz (82.4 kg)   SpO2 96%   BMI 29.31 kg/m   General: A&O, NAD HEENT: No sign of trauma, EOM grossly intact Cardiac: irregular rhythm, regular rate, no murmurs Respiratory: CTAB, normal WOB, no w/c/r GI: Soft, NTTP, non-distended  Extremities: NTTP, no peripheral edema, small abrasion well healing on left knee, strength 5/5  bilaterally Rectal: normal tone and sensation with DRE Neuro: Normal gait, moves all four extremities appropriately. Psych: Appropriate mood and affect  Erskine Emery CMA present as chaperone for rectal exam. 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[place]) - given another week of 3 mg daily Xanax while we add additional medicines to help with her anxiety/depression, next week will be 4 weeks on this dose and plan to take to 2mg  daily (1 mg BID), discussed with patient that she needs to take this as prescribed and should not be taking extra additional doses, as long as she is taking it daily she may still have some anxiety but will not be at risk of seizing from withdrawal  Alcohol use - she admits to drinking 2 drinks yesterday when anxious, discussed the risk of respiratory suppression and death with her other medications like the Xanax and alcohol use, discussed that we are trying other things to manage her anxiety and depression and she really needs to avoid the alcohol to treat these - she voiced awareness of risk of death, seizures, withdrawal, respiratory depression - continue thiamine daily  Depression - with anxiety, passive thoughts of not wanting to be around but no plan or intent, was able to safety contract with me today, discsused  going to [facility]/ED if thoughts became active, discussed avoidance of alcohol as I am concerned this is only making her mood worse - starting seroquel 25 mg qHS for depression and sleep, I have not heard back from psychiatry but called and left message today  Anxiety disorder - adding Seroquel as above, continue Xanax 3mg  daily with plan to taper to 1mg  BID next week only 1  week script given, consider addition of buspar at follow up, can continue hydroxyzine PRN when she is anxious although she notes this is not helping  Other chest pain - no pain today, discussed ED return precautions that if she is having palpitation, chest pain, sweating, or trouble breathing, she needs to go to the ED to be evaluated, pt voiced understanding and is in agreement, discussed importance of following up with cardiology for stress test  Back pain - continue nasal calcitonin for compression fractures, lidocaine cream available at pharmacy she states she will pick up and try (patches declined by insurance), no NSAIDs - discussed why I cannot prescribe opioids for pain due to the risk of respiratory depression and death with her chronic benzo use, alcohol use, and not always taking medications as prescribed, she voiced understanding of why and we discussed how I am trying to treat her mood and pain while also weaning her off of medications that are dangerous for her - through shared decision making we decided on referral to PM&R to help with other pain med modalities - no red flag signs symtpoms- she did mention sometimes loose stools and trouble getting to the bathroom in time but also sometimes smears of stool in her underwear, thus performed a chaperoned DRE which she had normal sensation and rectal tone thus do not suspect cauda equina     [name], MD [place]

## 2020-11-29 LAB — VITAMIN D 25 HYDROXY (VIT D DEFICIENCY, FRACTURES): Vit D, 25-Hydroxy: 17.7 ng/mL — ABNORMAL LOW (ref 30.0–100.0)

## 2020-11-29 NOTE — Assessment & Plan Note (Addendum)
-   continue nasal calcitonin for compression fractures, lidocaine cream available at pharmacy she states she will pick up and try (patches declined by insurance), no NSAIDs - discussed why I cannot prescribe opioids for pain due to the risk of respiratory depression and death with her chronic benzo use, alcohol use, and not always taking medications as prescribed, she voiced understanding of why and we discussed how I am trying to treat her mood and pain while also weaning her off of medications that are dangerous for her - through shared decision making we decided on referral to PM&R to help with other pain med modalities - no red flag signs symtpoms- she did mention sometimes loose stools and trouble getting to the bathroom in time but also sometimes smears of stool in her underwear, thus performed a chaperoned DRE which she had normal sensation and rectal tone thus do not suspect cauda equina

## 2020-11-29 NOTE — Assessment & Plan Note (Addendum)
-   with anxiety, passive thoughts of not wanting to be around but no plan or intent, was able to safety contract with me today, discsused going to [facility]/ED if thoughts became active, discussed avoidance of alcohol as I am concerned this is only making her mood worse - starting seroquel 25 mg qHS for depression and sleep, I have not heard back from psychiatry but called and left message today

## 2020-11-29 NOTE — Assessment & Plan Note (Signed)
-   no pain today, discussed ED return precautions that if she is having palpitation, chest pain, sweating, or trouble breathing, she needs to go to the ED to be evaluated, pt voiced understanding and is in agreement, discussed importance of following up with cardiology for stress test

## 2020-11-29 NOTE — Assessment & Plan Note (Signed)
-   missed appt with cardiology, on metoprolol and Eliquis, has follow up for monitor scheduled and needs stress test rescheduled, discussed importance of making these appts as I would like to see if she could get off of Eliquis

## 2020-11-29 NOTE — Assessment & Plan Note (Addendum)
-   she admits to drinking 2 drinks yesterday when anxious, discussed the risk of respiratory suppression and death with her other medications like the Xanax and alcohol use, discussed that we are trying other things to manage her anxiety and depression and she really needs to avoid the alcohol to treat these - she voiced awareness of risk of death, seizures, withdrawal, respiratory depression - continue thiamine daily

## 2020-11-29 NOTE — Assessment & Plan Note (Signed)
-   adding Seroquel as above, continue Xanax 3mg  daily with plan to taper to 1mg  BID next week only 1 week script given, consider addition of buspar at follow up, can continue hydroxyzine PRN when she is anxious although she notes this is not helping

## 2020-11-29 NOTE — Assessment & Plan Note (Signed)
-   given another week of 3 mg daily Xanax while we add additional medicines to help with her anxiety/depression, next week will be 4 weeks on this dose and plan to take to 2mg  daily (1 mg BID), discussed with patient that she needs to take this as prescribed and should not be taking extra additional doses, as long as she is taking it daily she may still have some anxiety but will not be at risk of seizing from withdrawal

## 2020-12-01 ENCOUNTER — Other Ambulatory Visit: Payer: Self-pay | Admitting: Family Medicine

## 2020-12-01 ENCOUNTER — Telehealth: Payer: 59

## 2020-12-01 DIAGNOSIS — K219 Gastro-esophageal reflux disease without esophagitis: Secondary | ICD-10-CM

## 2020-12-05 ENCOUNTER — Encounter: Payer: Self-pay | Admitting: Family Medicine

## 2020-12-05 ENCOUNTER — Emergency Department (HOSPITAL_COMMUNITY)
Admission: EM | Admit: 2020-12-05 | Discharge: 2020-12-05 | Disposition: A | Discharge status: Home / Self Care | Payer: 59 | Attending: Emergency Medicine | Admitting: Emergency Medicine

## 2020-12-05 ENCOUNTER — Emergency Department (HOSPITAL_COMMUNITY): Payer: 59

## 2020-12-05 ENCOUNTER — Other Ambulatory Visit: Payer: Self-pay

## 2020-12-05 ENCOUNTER — Ambulatory Visit (HOSPITAL_COMMUNITY)
Admit: 2020-12-05 | Discharge: 2020-12-05 | Disposition: A | Discharge status: Home / Self Care | Payer: 59 | Attending: Family Medicine | Admitting: Family Medicine

## 2020-12-05 ENCOUNTER — Ambulatory Visit (INDEPENDENT_AMBULATORY_CARE_PROVIDER_SITE_OTHER): Payer: 59 | Admitting: Family Medicine

## 2020-12-05 ENCOUNTER — Encounter (HOSPITAL_COMMUNITY): Payer: Self-pay

## 2020-12-05 VITALS — BP 86/47 | HR 91 | Wt 173.0 lb

## 2020-12-05 DIAGNOSIS — I959 Hypotension, unspecified: Secondary | ICD-10-CM | POA: Insufficient documentation

## 2020-12-05 DIAGNOSIS — Z79899 Other long term (current) drug therapy: Secondary | ICD-10-CM | POA: Diagnosis not present

## 2020-12-05 DIAGNOSIS — I10 Essential (primary) hypertension: Secondary | ICD-10-CM | POA: Diagnosis not present

## 2020-12-05 DIAGNOSIS — Z7901 Long term (current) use of anticoagulants: Secondary | ICD-10-CM | POA: Insufficient documentation

## 2020-12-05 DIAGNOSIS — F419 Anxiety disorder, unspecified: Secondary | ICD-10-CM

## 2020-12-05 DIAGNOSIS — Y902 Blood alcohol level of 40-59 mg/100 ml: Secondary | ICD-10-CM | POA: Insufficient documentation

## 2020-12-05 DIAGNOSIS — E871 Hypo-osmolality and hyponatremia: Secondary | ICD-10-CM | POA: Diagnosis not present

## 2020-12-05 DIAGNOSIS — R079 Chest pain, unspecified: Secondary | ICD-10-CM

## 2020-12-05 DIAGNOSIS — F17210 Nicotine dependence, cigarettes, uncomplicated: Secondary | ICD-10-CM | POA: Diagnosis not present

## 2020-12-05 LAB — COMPREHENSIVE METABOLIC PANEL
ALT: 10 U/L (ref 0–44)
AST: 20 U/L (ref 15–41)
Albumin: 2.9 g/dL — ABNORMAL LOW (ref 3.5–5.0)
Alkaline Phosphatase: 71 U/L (ref 38–126)
Anion gap: 13 (ref 5–15)
BUN: <5 mg/dL — ABNORMAL LOW (ref 8–23)
CO2: 27 mmol/L (ref 22–32)
Calcium: 8.6 mg/dL — ABNORMAL LOW (ref 8.9–10.3)
Chloride: 88 mmol/L — ABNORMAL LOW (ref 98–111)
Creatinine, Ser: 0.81 mg/dL (ref 0.44–1.00)
GFR, Estimated: >60 mL/min (ref >60)
Glucose, Bld: 86 mg/dL (ref 70–99)
Potassium: 3.9 mmol/L (ref 3.5–5.1)
Sodium: 128 mmol/L — ABNORMAL LOW (ref 135–145)
Total Bilirubin: 0.1 mg/dL — ABNORMAL LOW (ref 0.3–1.2)
Total Protein: 5.5 g/dL — ABNORMAL LOW (ref 6.5–8.1)

## 2020-12-05 LAB — CBC WITH DIFFERENTIAL/PLATELET
Abs Immature Granulocytes: 0.02 10*3/uL (ref 0.00–0.07)
Basophils Absolute: 0 10*3/uL (ref 0.0–0.1)
Basophils Relative: 1 %
Eosinophils Absolute: 0.1 10*3/uL (ref 0.0–0.5)
Eosinophils Relative: 1 %
HCT: 43 % (ref 36.0–46.0)
Hemoglobin: 14.4 g/dL (ref 12.0–15.0)
Immature Granulocytes: 0 %
Lymphocytes Relative: 24 %
Lymphs Abs: 1.7 10*3/uL (ref 0.7–4.0)
MCH: 31.3 pg (ref 26.0–34.0)
MCHC: 33.5 g/dL (ref 30.0–36.0)
MCV: 93.5 fL (ref 80.0–100.0)
Monocytes Absolute: 0.6 10*3/uL (ref 0.1–1.0)
Monocytes Relative: 9 %
Neutro Abs: 4.6 10*3/uL (ref 1.7–7.7)
Neutrophils Relative %: 65 %
Platelets: 269 10*3/uL (ref 150–400)
RBC: 4.6 MIL/uL (ref 3.87–5.11)
RDW: 14 % (ref 11.5–15.5)
WBC: 7.1 10*3/uL (ref 4.0–10.5)
nRBC: 0 % (ref 0.0–0.2)

## 2020-12-05 LAB — TROPONIN I (HIGH SENSITIVITY)
Troponin I (High Sensitivity): 7 ng/L (ref <18)
Troponin I (High Sensitivity): 5 ng/L (ref <18)

## 2020-12-05 LAB — ETHANOL: Alcohol, Ethyl (B): 55 mg/dL — ABNORMAL HIGH (ref <10)

## 2020-12-05 MED ORDER — LORAZEPAM 2 MG/ML IJ SOLN: 0.0000 mg | Freq: Four times a day (QID) | INTRAMUSCULAR | Status: DC

## 2020-12-05 MED ORDER — LORAZEPAM 1 MG PO TABS: 0.0000 mg | ORAL_TABLET | Freq: Two times a day (BID) | ORAL | Status: DC

## 2020-12-05 MED ORDER — LACTATED RINGERS IV BOLUS
1000.0000 mL | Freq: Once | INTRAVENOUS | Status: AC
  Administered 2020-12-05: 1000 mL via INTRAVENOUS

## 2020-12-05 MED ORDER — THIAMINE HCL 100 MG PO TABS
100.0000 mg | ORAL_TABLET | Freq: Once | ORAL | Status: AC
  Administered 2020-12-05: 100 mg via ORAL
  Filled 2020-12-05: qty 1

## 2020-12-05 MED ORDER — ALBUTEROL SULFATE HFA 108 (90 BASE) MCG/ACT IN AERS
2.0000 | INHALATION_SPRAY | Freq: Once | RESPIRATORY_TRACT | Status: AC
  Administered 2020-12-05: 2 via RESPIRATORY_TRACT
  Filled 2020-12-05: qty 6.7

## 2020-12-05 MED ORDER — LORAZEPAM 1 MG PO TABS: 0.0000 mg | ORAL_TABLET | Freq: Four times a day (QID) | ORAL | Status: DC

## 2020-12-05 MED ORDER — LORAZEPAM 2 MG/ML IJ SOLN: 0.0000 mg | Freq: Two times a day (BID) | INTRAMUSCULAR | Status: DC

## 2020-12-05 MED ORDER — ASPIRIN 325 MG PO TABS
325.0000 mg | ORAL_TABLET | Freq: Every day | ORAL | Status: DC
  Administered 2020-12-05: 325 mg via ORAL

## 2020-12-05 MED ORDER — ALPRAZOLAM 0.5 MG PO TABS
0.5000 mg | ORAL_TABLET | Freq: Every day | ORAL | 0 refills | Status: DC | PRN
Start: 2020-12-05 — End: 2020-12-09

## 2020-12-05 MED ORDER — CLONAZEPAM 1 MG PO TABS: 1.0000 mg | ORAL_TABLET | Freq: Two times a day (BID) | ORAL | 0 refills | Status: DC

## 2020-12-05 NOTE — Progress Notes (Signed)
    SUBJECTIVE:   CHIEF COMPLAINT / HPI:   Anxiety- currently taking 3mg  Xanax daily. Feels this is not enough for her anxiety. Last took one dose at 0400 this morning  Depression- started Seroquel 25mg  qHS at the last visit. Increased to 2 tabs (50mg ) last night.   Chest pain- under left breast, constant since she woke up, similar to chest pain she has been having in the past. Does not radiate. Is not feeling her heart race. Denies shortness of breath or palpitations.   Hypotension- repeat blood pressure 60s/40s when standing. She notes she has not been eating or drinking well. Yesterday all she had was two beers.   PERTINENT  PMH / PSH: A fib, compression fractures, anxiety/depression  OBJECTIVE:   BP (!) 90/52   Pulse 91   Wt 173 lb (78.5 kg)   SpO2 96%   BMI 27.92 kg/m   General: A&O, NAD HEENT: No sign of trauma, EOM grossly intact Cardiac: RRR, no m/r/g, + left chest wall tenderness Respiratory: CTAB, normal WOB, no w/c/r GI: Soft, NTTP, non-distended  Extremities: NTTP, no peripheral edema. Neuro: Normal gait, moves all four extremities appropriately. Psych: Appropriate mood and affect  Repeat BP 86/47 sitting, repeat standing 60s/47s  ASSESSMENT/PLAN:   Hypotension, chest pain - EMS called, EKG in clinic showing NSR, no ST segment changes or T wave inversions - 325 mg aspirin given - orthostatic vitals + with severe hypotension - Left sided chest pain,  some reproducibility on palpation  Anxiety - plans to transition to Klonopin 1mg  BID and PRN Xanax 0.5 mg no more than one per day, encouraged patient to not use daily  Depression - increase Seroquel to 50mg  qHS x1 week, then plan to transition to 75mg  qHS and continue to increase - would like to retrial SSRI when she is no longer drinking and eating well to monitor for recurrent hyponatremia   [name], MD [place]

## 2020-12-05 NOTE — ED Triage Notes (Signed)
Pt bib [facility] from doctor's office. EMS was called out for hypotension and chest pain. Pt states she has had Left sided chest pain since March. Pt states that this normally happens with her anxiety. Pt states she took all of her morning meds and Xanax last at 0400 today. Denies pain and cough.  EMS vitals: 112/70 BP

## 2020-12-05 NOTE — ED Provider Notes (Addendum)
[facility] EMERGENCY DEPARTMENT Provider Note   CSN: [id number] Arrival date & time: 12/05/20  1009     History Chief Complaint  Patient presents with   Hypotension    Karen Casey is a 65 y.o. female.  HPI 65 year old female sent over from family practice via EMS for hypotension.  Patient's been having a lot of chronic symptoms for about 3 months.  This includes daily chest pain, chronic anxiety requiring multiple doses of Xanax per day, back pain.  She also drinks alcohol.  She has had decreased appetite for a while and for a long time has been having dizziness when standing.  In the family practice office her blood pressure was in the 60s.  She was there for a typical checkup and refill.  She has chest pain at her left breast but again this has been every day and currently is not present.  No new shortness of breath but she chronically has dyspnea and has as needed oxygen.  No cough, fever, dysuria or other signs of an infection.  No abdominal pain.  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   Seizures ([place])    xanax withdrawl- December 2013   Urinary incontinence 06/09/2012    Patient Active Problem List   Diagnosis Date Noted   Chronic pain of breast 11/12/2020   Compression fracture of body of thoracic vertebra ([place]) 11/06/2020   Drug withdrawal seizure with complication ([place]) 42/70/6237   SIADH (syndrome of inappropriate ADH production) ([place]) 10/12/2020   Other chest pain    Hypomagnesemia    Long term (current) use of anticoagulants    Protein-calorie malnutrition ([place]) 09/17/2020   Paroxysmal atrial fibrillation ([place])    Alcohol withdrawal ([place]) 09/10/2020   Pulmonary nodules/lesions, multiple 07/14/2020   Chronic left shoulder pain 06/22/2020    HTN (hypertension) 06/02/2018   Body mass index (BMI) 31.0-31.9, adult 03/29/2013   Back pain 02/14/2013   Anxiety disorder 06/12/2012   Alcohol use 05/28/2012   Tobacco abuse 02/17/2012   GERD (gastroesophageal reflux disease) 02/04/2012   Depression 02/04/2012    Past Surgical History:  Procedure Laterality Date   APPENDECTOMY     CATARACT EXTRACTION  06/09/2012   Left eye   left shoulder dislocation  Sept 2011     OB History   No obstetric history on file.     Family History  Problem Relation Age of Onset   Hypertension Mother    Hyperlipidemia Mother    Aneurysm Mother        Rupture - Cause of death   Heart disease Father        MI - cause of death   Depression Father    Parkinsonism Father    Colon cancer Neg Hx    Esophageal cancer Neg Hx    Rectal cancer Neg Hx    Stomach cancer Neg Hx     Social History   Tobacco Use   Smoking status: Every Day    Packs/day: 1.00    Years: 43.00    Pack years: 43.00    Types: Cigarettes   Smokeless tobacco: Never  Vaping Use   Vaping Use: Never used  Substance Use Topics   Alcohol use:  Yes    Alcohol/week: 3.0 standard drinks    Types: 3 Cans of beer per week    Comment: 2-3 times   Drug use: Yes    Types: Marijuana, Other-see comments    Comment: Past hx of benzo abuse--xanax    Home Medications Prior to Admission medications   Medication Sig Start Date End Date Taking? Authorizing Provider  acetaminophen (TYLENOL) 500 MG tablet Take 1,000 mg by mouth every 6 (six) hours as needed for mild pain.    [provider]  ALPRAZolam Duanne Moron) 0.5 MG tablet Take 1 tablet (0.5 mg total) by mouth daily as needed for anxiety. No more than 1 tab per day 12/05/20   [name], MD  apixaban (ELIQUIS) 5 MG TABS tablet Take 1 tablet (5 mg total) by mouth 2 (two) times daily. 11/11/20   [name], MD  calcitonin, salmon, (MIACALCIN/FORTICAL) 200 UNIT/ACT nasal spray Place 1 spray into alternate nostrils  daily. 11/28/20 12/28/20  [name], MD  clonazePAM (KLONOPIN) 1 MG tablet Take 1 tablet (1 mg total) by mouth 2 (two) times daily for 14 days. 12/05/20 12/19/20  [name], MD  diclofenac Sodium (VOLTAREN) 1 % GEL Apply 2 g topically 4 (four) times daily. 10/23/20   Wilber Oliphant, MD  feeding supplement (ENSURE ENLIVE / ENSURE PLUS) LIQD Take 237 mLs by mouth 2 (two) times daily between meals. 10/17/20   Lavina Hamman, MD  fluticasone (FLONASE) 50 MCG/ACT nasal spray SHAKE LIQUID AND USE 2 SPRAYS IN EACH NOSTRIL DAILY 07/15/20   [name], MD  gabapentin (NEURONTIN) 600 MG tablet Take 2 tablets (1,200 mg total) by mouth 3 (three) times daily. 10/17/20   Lavina Hamman, MD  lidocaine (LINDAMANTLE) 3 % CREA cream Apply 1 application topically as needed (up to four times per day). 11/28/20   [name], MD  metoprolol tartrate (LOPRESSOR) 25 MG tablet Take 2 tablets (50 mg total) by mouth 2 (two) times daily. 11/13/20   Cantwell, Celeste C, PA-C  Multiple Vitamins-Minerals (CENTRUM SILVER 50+WOMEN PO) Take 1 tablet by mouth daily.    [provider]  nicotine (NICODERM CQ - DOSED IN MG/24 HOURS) 21 mg/24hr patch Place 1 patch (21 mg total) onto the skin daily. 10/18/20   Lavina Hamman, MD  pantoprazole (PROTONIX) 40 MG tablet Take 1 tablet (40 mg total) by mouth daily. 11/12/20   [name], MD  QUEtiapine (SEROQUEL) 25 MG tablet Take 1 tablet (25 mg total) by mouth at bedtime. 11/28/20 12/28/20  [name], MD  thiamine 100 MG tablet Take 1 tablet (100 mg total) by mouth daily. 10/18/20   Lavina Hamman, MD  umeclidinium bromide (INCRUSE ELLIPTA) 62.5 MCG/INH AEPB Inhale 1 puff into the lungs daily. 10/24/20   Wilber Oliphant, MD    Allergies    Patient has no known allergies.  Review of Systems   Review of Systems  Constitutional:  Negative for fever.  Respiratory:  Negative for cough and shortness of breath.   Cardiovascular:  Positive for chest pain.   Gastrointestinal:  Positive for diarrhea (chronic). Negative for abdominal pain and vomiting.  Musculoskeletal:  Positive for back pain.  Neurological:  Positive for light-headedness.  All other systems reviewed and are negative.  Physical Exam Updated Vital Signs BP (!) 180/96   Pulse 78   Temp 98 F (36.7 C) (Oral)   Resp 17   Ht 5\' 6"  (1.676 m)  Wt 78.5 kg   SpO2 91%   BMI 27.93 kg/m   Physical Exam Vitals and nursing note reviewed.  Constitutional:      General: She is not in acute distress.    Appearance: She is well-developed. She is not ill-appearing or diaphoretic.  HENT:     Head: Normocephalic and atraumatic.     Right Ear: External ear normal.     Left Ear: External ear normal.     Nose: Nose normal.     Mouth/Throat:     Mouth: Mucous membranes are dry.  Eyes:     General:        Right eye: No discharge.        Left eye: No discharge.  Cardiovascular:     Rate and Rhythm: Normal rate and regular rhythm.     Heart sounds: Normal heart sounds.  Pulmonary:     Effort: Pulmonary effort is normal.     Breath sounds: Wheezing (diffuse, mild) present.  Abdominal:     General: There is no distension.     Palpations: Abdomen is soft.     Tenderness: There is no abdominal tenderness.  Skin:    General: Skin is warm and dry.  Neurological:     Mental Status: She is alert.  Psychiatric:        Mood and Affect: Mood is not anxious.    ED Results / Procedures / Treatments   Labs (all labs ordered are listed, but only abnormal results are displayed) Labs Reviewed  COMPREHENSIVE METABOLIC PANEL - Abnormal; Notable for the following components:      Result Value   Sodium 128 (*)    Chloride 88 (*)    BUN <5 (*)    Calcium 8.6 (*)    Total Protein 5.5 (*)    Albumin 2.9 (*)    Total Bilirubin 0.1 (*)    All other components within normal limits  ETHANOL - Abnormal; Notable for the following components:   Alcohol, Ethyl (B) 55 (*)    All other  components within normal limits  CBC WITH DIFFERENTIAL/PLATELET  TROPONIN I (HIGH SENSITIVITY)  TROPONIN I (HIGH SENSITIVITY)    EKG EKG Interpretation  Date/Time:  Friday December 05 2020 10:43:21 EDT Ventricular Rate:  66 PR Interval:  145 QRS Duration: 73 QT Interval:  437 QTC Calculation: 458 R Axis:   46 Text Interpretation: Sinus rhythm Probable left atrial enlargement similar to earlier in the day and May 2022 Confirmed by Sherwood Gambler [id number]) on 12/05/2020 11:10:33 AM  Radiology DG Chest Portable 1 View  Result Date: 12/05/2020 CLINICAL DATA:  Chest pain and hypotension. Chest pain is left-sided and reportedly has been present since March. EXAM: PORTABLE CHEST 1 VIEW COMPARISON:  10/16/2020. FINDINGS: Cardiac silhouette is normal in size. Normal mediastinal and hilar contours. Clear lungs.  No pleural effusion or pneumothorax. Skeletal structures are grossly intact. IMPRESSION: No active disease. Electronically Signed   By: Lajean Manes M.D.   On: 12/05/2020 11:06    Procedures Procedures   Medications Ordered in ED Medications  LORazepam (ATIVAN) injection 0-4 mg (0 mg Intravenous Not Given 12/05/20 1235)    Or  LORazepam (ATIVAN) tablet 0-4 mg ( Oral See Alternative 12/05/20 1235)  LORazepam (ATIVAN) injection 0-4 mg (has no administration in time range)    Or  LORazepam (ATIVAN) tablet 0-4 mg (has no administration in time range)  lactated ringers bolus 1,000 mL (0 mLs Intravenous Stopped 12/05/20 1227)  albuterol (VENTOLIN  HFA) 108 (90 Base) MCG/ACT inhaler 2 puff (2 puffs Inhalation Given 12/05/20 1106)  thiamine tablet 100 mg (100 mg Oral Given 12/05/20 1242)  lactated ringers bolus 1,000 mL (0 mLs Intravenous Stopped 12/05/20 1443)    ED Course  I have reviewed the triage vital signs and the nursing notes.  Pertinent labs & imaging results that were available during my care of the patient were reviewed by me and considered in my medical decision making (see chart  for details).    MDM Rules/Calculators/A&P                          Labs have been reviewed and are fairly unremarkable besides mild hyponatremia.  Not nearly as bad as when she was admitted recently.  Likely related to alcohol abuse, which patient is in denial of.  Otherwise, she is not showing signs of withdrawal right now and is not currently intoxicated.  Hypotension likely a mixture of poor p.o. intake and alcohol abuse.  Given fluids with good response.  She is able to get up and walk.  Appears stable for discharge home to follow-up with her PCP.  Chest pain seems to be chronic and I have low suspicion of ACS, PE, dissection. Final Clinical Impression(s) / ED Diagnoses Final diagnoses:  Hypotension, unspecified hypotension type  Hyponatremia    Rx / DC Orders ED Discharge Orders     None        Sherwood Gambler, MD 12/05/20 1553    Sherwood Gambler, MD 12/05/20 [phone]

## 2020-12-05 NOTE — Patient Instructions (Addendum)
It was wonderful to see you today.  Please bring ALL of your medications with you to every visit.   Today we talked about:  - Follow up appointment with cardiology next week   - We have started you on Klonopin 1mg  twice daily, with Xanax 0.5 mg daily AS NEEDED. Do not take more than 1 Xanax per day and only for a really bad day with anxiety - Continue the increased dose of Seroquel 50mg  at night, and in one week if helping but you still are having trouble sleeping increase to 3 tabs (75 mg) at night - I will call you about a 2 week follow up appointment with myself     If you are feeling suicidal or depression symptoms worsen please immediately go to:   [facility]  [address] [place], [place] [facility] [phone]    If you are thinking about harming yourself or having thoughts of suicide, or if you know someone who is, seek help right away. Call your doctor or mental health care provider. Call 911 or go to a hospital emergency room to get immediate help, or ask a friend or family member to help you do these things. Call the [place] National Suicide Prevention Lifeline's toll-free, 24-hour hotline at 1-800-273-TALK 8156737320) or TTY: 1-800-799-4 TTY 417-887-2143) to talk to a trained counselor. If you are in crisis, make sure you are not left alone.  If someone else is in crisis, make sure he or she is not left alone   [facility] [employer] (Domestic Violence, Rape & Victim Assistance [phone]  [facility]    (ONLY from 8am-4pm)    [phone]  [facility] Crisis Unit (24/7)   [phone]  [place] National Suicide Hotline   [phone] Diamantina Monks)      Thank you for choosing [facility].   Please call [phone] with any questions about today's appointment.  Please be sure to schedule follow up at the front  desk  before you leave today.   Yehuda Savannah, MD  Family Medicine

## 2020-12-07 NOTE — Progress Notes (Signed)
Primary Physician/Referring:  [name], MD  Patient ID: Karen Casey, female    DOB: September 27, 1955, 65 y.o.   MRN: [id number]  Chief Complaint  Patient presents with   Atrial Fibrillation   Tachycardia   Results    Cardiac   HPI:    Karen Casey  is a 65 y.o.  Caucasian female with history of anxiety and depression with chronic Xanax use, alcohol abuse, tobacco dependence with 43-pack-year history.  Patient denies history of hyperlipidemia, MI, CVA/TIA, DVT/PE, diabetes.  Patient admitted 09/10/2020 - 09/22/2020 with alcohol withdrawal and acute hypoxic respiratory failure.  During this hospitalization we were consulted for new onset persistent atrial fibrillation, she subsequently spontaneously converted to normal sinus rhythm and was discharged on anticoagulation for 4 weeks for thromboembolic prophylaxis.  Following discharge on 09/22/2020 patient was again admitted 09/25/2020 - 09/28/2020 as well as 10/12/2020 - 10/17/2020 for hyponatremia both times.   Patient presents for 6 week follow up. At last visit increased metoprolol tartrate to 50 mg twice daily and ordered stress test and Zio monitor. Unfortunately stress test and monitor have not been done. Since last visit patient was evaluated in the ED for hypotension, which at the time was suspected to be related to poor oral intake and continued alcohol abuse.  Patient reports that her last alcoholic drink was last week and that she has been intermittently drinking alcohol prior to that.  She also admits to continuing to smoke half pack per day.  She has noticed increased wheezing and shortness of breath at home and is now using supplemental home oxygen more frequently compared to last visit.  She does continue to complain of intermittent palpitations as well as left and right-sided chest pain which is intermittent and both with rest and exertion.  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   Seizures ([place])    xanax withdrawl- December 2013   Urinary incontinence 06/09/2012   Past Surgical History:  Procedure Laterality Date   APPENDECTOMY     CATARACT EXTRACTION  06/09/2012   Left eye   left shoulder dislocation  Sept 2011   Family History  Problem Relation Age of Onset   Hypertension Mother    Hyperlipidemia Mother    Aneurysm Mother        Rupture - Cause of death   Heart disease Father        MI - cause of death   Depression Father    Parkinsonism Father    Hypertension Sister    Colon cancer Neg Hx    Esophageal cancer Neg Hx    Rectal cancer Neg Hx    Stomach cancer Neg Hx     Social History   Tobacco Use   Smoking status: Every Day    Packs/day: 1.00    Years: 43.00    Pack years: 43.00    Types: Cigarettes   Smokeless tobacco: Never  Substance Use Topics   Alcohol use: Yes    Alcohol/week: 3.0 standard drinks    Types: 3 Cans of beer per week    Comment: 2-3 times   Marital Status: Single   ROS  Review of Systems  Constitutional: Negative for malaise/fatigue and weight gain.  Cardiovascular:  Positive for chest pain (intermittent) and dyspnea on exertion. Negative for claudication, leg swelling, near-syncope, orthopnea, palpitations, paroxysmal nocturnal dyspnea and syncope.  Respiratory:  Positive for shortness of breath.   Hematologic/Lymphatic: Does not bruise/bleed easily.  Gastrointestinal:  Negative for melena.  Neurological:  Negative for dizziness and weakness.   Objective  Blood pressure (!) 147/70, pulse 89, temperature 98.3 F (36.8 C), temperature source Temporal, resp. rate 16, height 5\' 6"  (1.676 m), weight 181 lb 3.2 oz (82.2 kg), SpO2 92 %.  Vitals with BMI 12/08/2020 12/05/2020 12/05/2020  Height 5\' 6"  - -  Weight 181 lbs 3 oz - -  BMI 11.94 - -  Systolic 174  081 448  Diastolic 70 96 94  Pulse 89 78 74  Some encounter information is confidential and restricted. Go to Review Flowsheets activity to see all data.    Orthostatic VS for the past 72 hrs (Last 3 readings):  Orthostatic BP Patient Position BP Location Cuff Size Orthostatic Pulse  12/08/20 0958 (!) 142/95 Standing Left Arm Large 85  12/08/20 0956 (!) 153/91 Sitting Left Arm Large 80  12/08/20 0955 (!) 148/91 Supine Left Arm Large 76      Physical Exam Vitals reviewed.  HENT:     Head: Normocephalic and atraumatic.  Cardiovascular:     Rate and Rhythm: Normal rate and regular rhythm.     Pulses: Intact distal pulses.     Heart sounds: S1 normal and S2 normal. No murmur heard.   No gallop.  Pulmonary:     Effort: Pulmonary effort is normal. No respiratory distress.     Breath sounds: Wheezing (bilaterally trhoughout) and rhonchi (bilaterally) present. No rales.  Musculoskeletal:     Right lower leg: No edema.     Left lower leg: No edema.  Skin:    General: Skin is warm and dry.  Neurological:     Mental Status: She is alert.    Laboratory examination:   Recent Labs    06/20/20 1431 09/09/20 2147 10/16/20 1652 10/17/20 0313 10/23/20 1140 11/06/20 1046 12/05/20 1058  NA 138   < > 128* 129* 134 136 128*  K 3.9   < > 4.4 4.5 4.5 5.3* 3.9  CL 100   < > 89* 92* 93* 93* 88*  CO2 25   < > 29 26 25  19* 27  GLUCOSE 97   < > 112* 117* 83 102* 86  BUN 4*   < > 22 26* 5* 5* <5*  CREATININE 0.72   < > 0.45 0.57 0.65 0.76 0.81  CALCIUM 9.2   < > 9.5 9.3 9.7 9.8 8.6*  GFRNONAA 90   < > >60 >60  --   --  >60  GFRAA 104  --   --   --   --   --   --    < > = values in this interval not displayed.   estimated creatinine clearance is 77.9 mL/min (by C-G formula based on SCr of 0.81 mg/dL).  CMP Latest Ref Rng & Units 12/05/2020 11/06/2020 10/23/2020  Glucose 70 - 99 mg/dL 86 102(H) 83  BUN 8 - 23 mg/dL <5(L) 5(L) 5(L)  Creatinine 0.44 - 1.00 mg/dL 0.81 0.76 0.65  Sodium 135 -  145 mmol/L 128(L) 136 134  Potassium 3.5 - 5.1 mmol/L 3.9 5.3(H) 4.5  Chloride 98 - 111 mmol/L 88(L) 93(L) 93(L)  CO2 22 - 32 mmol/L 27 19(L) 25  Calcium 8.9 - 10.3 mg/dL 8.6(L)  9.8 9.7  Total Protein 6.5 - 8.1 g/dL 5.5(L) - -  Total Bilirubin 0.3 - 1.2 mg/dL 0.1(L) - -  Alkaline Phos 38 - 126 U/L 71 - -  AST 15 - 41 U/L 20 - -  ALT 0 - 44 U/L 10 - -   CBC Latest Ref Rng & Units 12/05/2020 10/16/2020 10/12/2020  WBC 4.0 - 10.5 K/uL 7.1 6.9 8.1  Hemoglobin 12.0 - 15.0 g/dL 14.4 13.4 15.2(H)  Hematocrit 36.0 - 46.0 % 43.0 39.7 43.3  Platelets 150 - 400 K/uL 269 136(L) 183    Lipid Panel Recent Labs    09/17/20 0208  CHOL 97  TRIG 68  LDLCALC 42  VLDL 14  HDL 41  CHOLHDL 2.4    HEMOGLOBIN A1C Lab Results  Component Value Date   HGBA1C 5.2 09/17/2020   MPG 102.54 09/17/2020   TSH Recent Labs    09/12/20 0949 09/25/20 1429 10/12/20 2244  TSH 0.827 2.088 2.413    External labs:  None   Allergies  No Known Allergies    Medications Prior to Visit:   Outpatient Medications Prior to Visit  Medication Sig Dispense Refill   acetaminophen (TYLENOL) 500 MG tablet Take 1,000 mg by mouth every 6 (six) hours as needed for mild pain.     ALPRAZolam (XANAX) 0.5 MG tablet Take 1 tablet (0.5 mg total) by mouth daily as needed for anxiety. No more than 1 tab per day 10 tablet 0   calcitonin, salmon, (MIACALCIN/FORTICAL) 200 UNIT/ACT nasal spray Place 1 spray into alternate nostrils daily. 2.7 mL 0   clonazePAM (KLONOPIN) 1 MG tablet Take 1 tablet (1 mg total) by mouth 2 (two) times daily for 14 days. 28 tablet 0   diclofenac Sodium (VOLTAREN) 1 % GEL Apply 2 g topically 4 (four) times daily. 50 g 1   feeding supplement (ENSURE ENLIVE / ENSURE PLUS) LIQD Take 237 mLs by mouth 2 (two) times daily between meals. 10000 mL 0   fluticasone (FLONASE) 50 MCG/ACT nasal spray SHAKE LIQUID AND USE 2 SPRAYS IN EACH NOSTRIL DAILY 16 g 0   gabapentin (NEURONTIN) 600 MG tablet Take 2 tablets  (1,200 mg total) by mouth 3 (three) times daily. 180 tablet 0   Multiple Vitamins-Minerals (CENTRUM SILVER 50+WOMEN PO) Take 1 tablet by mouth daily.     pantoprazole (PROTONIX) 40 MG tablet Take 1 tablet (40 mg total) by mouth daily. 30 tablet 2   QUEtiapine (SEROQUEL) 25 MG tablet Take 1 tablet (25 mg total) by mouth at bedtime. 30 tablet 0   apixaban (ELIQUIS) 5 MG TABS tablet Take 1 tablet (5 mg total) by mouth 2 (two) times daily. 60 tablet 0   metoprolol tartrate (LOPRESSOR) 25 MG tablet Take 2 tablets (50 mg total) by mouth 2 (two) times daily. 120 tablet 3   umeclidinium bromide (INCRUSE ELLIPTA) 62.5 MCG/INH AEPB Inhale 1 puff into the lungs daily. 30 each 0   lidocaine (LINDAMANTLE) 3 % CREA cream Apply 1 application topically as needed (up to four times per day). 85 g 0   nicotine (NICODERM CQ - DOSED IN MG/24 HOURS) 21 mg/24hr patch Place 1 patch (21 mg total) onto the skin daily. 28 patch 0   thiamine 100 MG tablet Take 1 tablet (100 mg total) by mouth daily. 30 tablet 0   aspirin tablet 325 mg      No facility-administered medications prior to visit.     Final Medications at End of Visit  Current Meds  Medication Sig   acetaminophen (TYLENOL) 500 MG tablet Take 1,000 mg by mouth every 6 (six) hours as needed for mild pain.   albuterol (VENTOLIN HFA) 108 (90 Base) MCG/ACT inhaler Inhale 2 puffs into the lungs every 6 (six) hours as needed for wheezing or shortness of breath.   ALPRAZolam (XANAX) 0.5 MG tablet Take 1 tablet (0.5 mg total) by mouth daily as needed for anxiety. No more than 1 tab per day   amLODipine (NORVASC) 5 MG tablet Take 1 tablet (5 mg total) by mouth daily.   calcitonin, salmon, (MIACALCIN/FORTICAL) 200 UNIT/ACT nasal spray Place 1 spray into alternate nostrils daily.   clonazePAM (KLONOPIN) 1 MG tablet Take 1 tablet (1 mg total) by mouth 2 (two) times daily for 14 days.   diclofenac Sodium (VOLTAREN) 1 % GEL Apply 2 g topically 4 (four) times daily.    feeding supplement (ENSURE ENLIVE / ENSURE PLUS) LIQD Take 237 mLs by mouth 2 (two) times daily between meals.   fluticasone (FLONASE) 50 MCG/ACT nasal spray SHAKE LIQUID AND USE 2 SPRAYS IN EACH NOSTRIL DAILY   gabapentin (NEURONTIN) 600 MG tablet Take 2 tablets (1,200 mg total) by mouth 3 (three) times daily.   Multiple Vitamins-Minerals (CENTRUM SILVER 50+WOMEN PO) Take 1 tablet by mouth daily.   pantoprazole (PROTONIX) 40 MG tablet Take 1 tablet (40 mg total) by mouth daily.   QUEtiapine (SEROQUEL) 25 MG tablet Take 1 tablet (25 mg total) by mouth at bedtime.   [DISCONTINUED] apixaban (ELIQUIS) 5 MG TABS tablet Take 1 tablet (5 mg total) by mouth 2 (two) times daily.   [DISCONTINUED] metoprolol tartrate (LOPRESSOR) 25 MG tablet Take 2 tablets (50 mg total) by mouth 2 (two) times daily.    Radiology:   No results found.  Cardiac Studies:   Echocardiogram 09/10/2020: 1. Left ventricular ejection fraction, by estimation, is 60 to 65%. The left ventricle has normal function. The left ventricle has no regional wall motion abnormalities. There is mild left ventricular hypertrophy. Left ventricular diastolic parameters were normal.   2. Right ventricular systolic function is normal. The right ventricular size is normal. There is normal pulmonary artery systolic pressure.   3. The mitral valve is normal in structure. Trivial mitral valve regurgitation. No evidence of mitral stenosis.   4. The aortic valve is tricuspid. Aortic valve regurgitation is not visualized. No aortic stenosis is present.   5. The inferior vena cava is normal in size with greater than 50%  respiratory variability, suggesting right atrial pressure of 3 mmHg.   EKG:  12/08/2020: Sinus rhythm at a rate of 79 bpm.  Normal axis.  Left atrial enlargement.  Incomplete right bundle branch block.  No evidence of ischemia or underlying injury pattern.  10/27/2020: Sinus tachycardia with 2 PACs rate of 90 bpm.  Left atrial  enlargement.  Left axis, left anterior fascicular block.  Incomplete right bundle branch block.  No evidence of ischemia or underlying injury pattern.  09/15/2020: Atrial fibrillation with rapid ventricular response at a rate of 119 bpm.  Normal axis.  Assessment     ICD-10-CM   1. Paroxysmal atrial fibrillation (HCC)  I48.0 EKG 12-Lead    2. Precordial pain  R07.2     3. Shortness of breath  R06.02 DG Chest 2 View       Medications Discontinued During This Encounter  Medication Reason   aspirin tablet 325 mg Error   lidocaine (LINDAMANTLE) 3 % CREA cream Error  nicotine (NICODERM CQ - DOSED IN MG/24 HOURS) 21 mg/24hr patch Error   thiamine 100 MG tablet Error   apixaban (ELIQUIS) 5 MG TABS tablet Discontinued by provider   metoprolol tartrate (LOPRESSOR) 25 MG tablet Discontinued by provider    Meds ordered this encounter  Medications   albuterol (VENTOLIN HFA) 108 (90 Base) MCG/ACT inhaler    Sig: Inhale 2 puffs into the lungs every 6 (six) hours as needed for wheezing or shortness of breath.    Dispense:  8 g    Refill:  2   amLODipine (NORVASC) 5 MG tablet    Sig: Take 1 tablet (5 mg total) by mouth daily.    Dispense:  30 tablet    Refill:  3  This patients CHA2DS2-VASc Score 1 (F) and yearly risk of stroke 0.6%.   Recommendations:   MILO SOLANA is a 65 y.o. Caucasian female with history of anxiety and depression with chronic Xanax use, alcohol abuse, tobacco dependence with 43-pack-year history.  Patient denies history of hyperlipidemia, MI, CVA/TIA, DVT/PE, diabetes.  Patient admitted 09/10/2020 - 09/22/2020 with alcohol withdrawal and acute hypoxic respiratory failure.  During this hospitalization we were consulted for new onset persistent atrial fibrillation, she subsequently spontaneously converted to normal sinus rhythm and was discharged on anticoagulation for 4 weeks for thromboembolic prophylaxis.  Following discharge on 09/22/2020 patient was again admitted  09/25/2020 - 09/28/2020 as well as 10/12/2020 - 10/17/2020 for hyponatremia both times.   Patient presents for 6 week follow up. At last visit increased metoprolol tartrate to 50 mg twice daily and ordered stress test and Zio monitor. Unfortunately stress test has not bene done and results of monitor are not available for review. Since last visit patient was evaluated in the ED for hypotension, which as the time was suspected to be related to poor oral intake and continued alcohol abuse.  Patient continues to have episodes of palpitations as well as chest discomfort, therefore recommend patient proceed with previously recommended stress test as well as ZIO monitor.  Discussed at length with patient and her sister who was present at visit regarding anticoagulation.  Patient's CHA2DS2-VASc score is 1 and she has had no known recurrence of atrial fibrillation.  Patient has also resumed drinking alcohol and reports recent fall.  Given low thromboembolic risk and elevated bleeding risk, shared decision was to discontinue Eliquis at this time.  Patient and her sister both aware of risks and benefits of anticoagulation and agree with discontinuation of Eliquis at this time.  In regard to shortness of breath and wheezing, suspect this is multifactorial.  Patient likely has underlying COPD which she has not formally been diagnosed with.  Given significant wheezing on exam today will discontinue metoprolol and switch patient to amlodipine 5 mg daily as her blood pressure remains elevated during today's office visit.  We will also start patient on albuterol inhaler every 6-8 hours as needed for wheezing and shortness of breath.  Will obtain chest x-ray to further investigate shortness of breath.  Advised patient to follow-up closely with her PCP regarding evaluation of shortness of breath.  Underlying cardiac etiology of dyspnea cannot be excluded including episodes of atrial fibrillation as well as underlying ischemic heart  disease.  We will obtain monitor and stress test to further evaluate cardiac etiology.  Counseled patient regarding signs and symptoms that would warrant urgent or emergent evaluation.  Patient's sister will also take her to obtain pulse oximeter as well as blood pressure cuff so  that patient may monitor blood pressure, heart rate, and oxygen saturation on a regular basis.  Significant portion of today's visit counseling patient regarding alcohol cessation as well as complete tobacco cessation and the importance to reduce cardiovascular risk.  Patient verbalized understanding, however she continues to struggle to make diet and lifestyle modifications.  Spent a total of 8 minutes counseling patient regarding tobacco and alcohol cessation.  Follow-up in 3 months, sooner if needed, for paroxysmal atrial fibrillation, hypertension, tobacco use, and results of cardiac testing.   Alethia Berthold, PA-C 12/08/2020, 11:26 AM Office: [phone]

## 2020-12-08 ENCOUNTER — Encounter: Payer: Self-pay | Admitting: Student

## 2020-12-08 ENCOUNTER — Other Ambulatory Visit: Payer: Self-pay

## 2020-12-08 ENCOUNTER — Ambulatory Visit: Payer: 59 | Admitting: Student

## 2020-12-08 ENCOUNTER — Ambulatory Visit
Admission: RE | Admit: 2020-12-08 | Discharge: 2020-12-08 | Disposition: A | Discharge status: Home / Self Care | Payer: 59 | Source: Ambulatory Visit | Attending: Student | Admitting: Student

## 2020-12-08 VITALS — BP 147/70 | HR 89 | Temp 98.3°F | Resp 16 | Ht 66.0 in | Wt 181.2 lb

## 2020-12-08 DIAGNOSIS — R072 Precordial pain: Secondary | ICD-10-CM

## 2020-12-08 DIAGNOSIS — I480 Paroxysmal atrial fibrillation: Secondary | ICD-10-CM

## 2020-12-08 DIAGNOSIS — R0602 Shortness of breath: Secondary | ICD-10-CM

## 2020-12-08 MED ORDER — AMLODIPINE BESYLATE 5 MG PO TABS: 5.0000 mg | ORAL_TABLET | Freq: Every day | ORAL | 3 refills | Status: DC

## 2020-12-08 MED ORDER — ALBUTEROL SULFATE HFA 108 (90 BASE) MCG/ACT IN AERS: 2.0000 | INHALATION_SPRAY | Freq: Four times a day (QID) | RESPIRATORY_TRACT | 2 refills | Status: DC | PRN

## 2020-12-09 ENCOUNTER — Other Ambulatory Visit (HOSPITAL_COMMUNITY): Payer: Self-pay | Admitting: Family Medicine

## 2020-12-09 ENCOUNTER — Encounter: Payer: Self-pay | Admitting: Family Medicine

## 2020-12-09 ENCOUNTER — Ambulatory Visit (INDEPENDENT_AMBULATORY_CARE_PROVIDER_SITE_OTHER): Payer: 59 | Admitting: Family Medicine

## 2020-12-09 ENCOUNTER — Other Ambulatory Visit: Payer: Self-pay | Admitting: Family Medicine

## 2020-12-09 VITALS — BP 142/88 | HR 103 | Ht 66.0 in | Wt 179.4 lb

## 2020-12-09 DIAGNOSIS — E871 Hypo-osmolality and hyponatremia: Secondary | ICD-10-CM

## 2020-12-09 DIAGNOSIS — Z7289 Other problems related to lifestyle: Secondary | ICD-10-CM | POA: Diagnosis not present

## 2020-12-09 DIAGNOSIS — J449 Chronic obstructive pulmonary disease, unspecified: Secondary | ICD-10-CM | POA: Insufficient documentation

## 2020-12-09 DIAGNOSIS — I480 Paroxysmal atrial fibrillation: Secondary | ICD-10-CM

## 2020-12-09 DIAGNOSIS — Z789 Other specified health status: Secondary | ICD-10-CM

## 2020-12-09 DIAGNOSIS — F32A Depression, unspecified: Secondary | ICD-10-CM

## 2020-12-09 DIAGNOSIS — R918 Other nonspecific abnormal finding of lung field: Secondary | ICD-10-CM

## 2020-12-09 DIAGNOSIS — F419 Anxiety disorder, unspecified: Secondary | ICD-10-CM

## 2020-12-09 DIAGNOSIS — R911 Solitary pulmonary nodule: Secondary | ICD-10-CM

## 2020-12-09 DIAGNOSIS — I10 Essential (primary) hypertension: Secondary | ICD-10-CM

## 2020-12-09 DIAGNOSIS — J441 Chronic obstructive pulmonary disease with (acute) exacerbation: Secondary | ICD-10-CM | POA: Insufficient documentation

## 2020-12-09 MED ORDER — QUETIAPINE FUMARATE 50 MG PO TABS: 50.0000 mg | ORAL_TABLET | Freq: Every day | ORAL | 1 refills | Status: DC

## 2020-12-09 MED ORDER — UMECLIDINIUM-VILANTEROL 62.5-25 MCG/INH IN AEPB: 1.0000 | INHALATION_SPRAY | Freq: Every day | RESPIRATORY_TRACT | 1 refills | Status: AC

## 2020-12-09 MED ORDER — ALPRAZOLAM 0.5 MG PO TABS: 0.5000 mg | ORAL_TABLET | Freq: Every day | ORAL | 0 refills | Status: DC | PRN

## 2020-12-09 MED ORDER — CLONAZEPAM 1 MG PO TABS: 1.0000 mg | ORAL_TABLET | Freq: Two times a day (BID) | ORAL | 0 refills | Status: DC

## 2020-12-09 NOTE — Assessment & Plan Note (Signed)
-   suspect COPD although she has not formally had PFTs, appt scheduled for 12/23/2020 with Dr Valentina Lucks - Sample for 2 weeks of Anoro Ellipta given to patient - escalation to LAMA/LABA as she was previously just on LAMA therapy, discussed to not take this the day before her PFT appointment to have accurate PFTs - this is a preferred inhaler for her plan so we could prescribe this at follow up if helping her breathing - discussed signs/symptoms of exacerbation of COPD which she does not have today

## 2020-12-09 NOTE — Assessment & Plan Note (Addendum)
-   Na 128 in ED last Friday, will recheck today, she notes she is drinking more water, not drinking alcohol, but still has poor appetite

## 2020-12-09 NOTE — Progress Notes (Signed)
SUBJECTIVE:   CHIEF COMPLAINT / HPI:   Afib- saw cardiology yesterday, in NSR, shared decision making made to discontinue Eliquis as her CHADS2VASC is only 1. Also discontinued metoprolol with her wheezing. Still recommending Zio monitor and stress test, she is not sure if they have been scheduled yet.  HTN- started on amlodipine 5mg  daily yesterday by cardiology.  Wheezing- CXR yesterday without acute infiltrate. On Incruse inhaler 1 puff daily, she has been taking twice daily. Was given PRN albuterol by the cardiologist, needs a spacer. Has not had formal PFTs. Still using oxygen at night. Had previously been referred for a sleep study but denied. Still smoking but less than before. She has a chronic cough but no increased sputum productions, no fevers or chills. Does feel her wheezing "gets out of control sometimes" just picked up new albuterol inhaler. Has her COVID vaccines and booster.  Pulmonary nodule- due for follow up CT chest in July 2022.   Anxiety- was taking Xanax "one or two" every morning. Taking more than 1 Klonopin per day. Did bring pill bottles with her, she has 3 of the 10 Xanax left and 12 of the 1 mg Klonopin left.   Depression- feels "about the same." Denies active or passive SI today. Notes her partner Elenore Rota states her mood seems improved. She is taking 2 tabs of the 25 mg Seroquel nightly, slept 5 hours last night and able to sleep on her belly for the first time in a long time.  PERTINENT  PMH / PSH: as above  OBJECTIVE:   BP (!) 142/88   Pulse (!) 103   Ht 5\' 6"  (1.676 m)   Wt 179 lb 6 oz (81.4 kg)   SpO2 93%   BMI 28.95 kg/m   General: A&O, NAD HEENT: No sign of trauma, EOM grossly intact Cardiac: RRR, no m/r/g Respiratory: CTAB, normal WOB, no w/c/r, speaking in complete sentences, no coughing GI: Soft, NTTP, non-distended  Extremities: NTTP, no peripheral edema. Neuro: Normal gait, moves all four extremities appropriately. Psych: Appropriate  mood and affect, does not appear anxious, smiling, making jokes   ASSESSMENT/PLAN:   Hyponatremia - Na 128 in ED last Friday, will recheck today, she notes she is drinking more water, not drinking alcohol, but still has poor appetite  Alcohol use - has not drank since last Thursday, congratulated patient, given AA number and other substance use resources, counseled on importance of continued cessation  HTN (hypertension) - just picked up amlodipine 5mg  yesterday, will continue   Paroxysmal atrial fibrillation ([place]) - CHADS2VASC 1, off of Eliquis and metoprolol since yesterday per shared decision making with cardiology - patient encouraged to call her cardiologist to scheduled Ziopatch monitor and stress test, staff message sent to cardiology to assist with this  Anxiety disorder - counseled extensively today that if she continues to take more benzodiazepines than prescribed as we attempt to taper that we will have to keep tapering and is a violation of her agreement with me - She has 12 tabs of Klonopin 1mg  left, has not taken yet today, so script sent for 1 mg BID to not be filled until 12/13/2020 (verified with Ms Bolding that her pharmacy is open on Saturday), she has enough to get to that appt through 12/14/2020 with her today, and then I prescribed exactly 19 more days to get to her follow up appointment with me on 01/02/2021 - She has three tabs of Xanax 0.5mg  left, explained I also would not refill  this until 12/13/2020 where I will give her 10 more tabs which is not enough for one every day and she needs to take them no more than once a day and only for extreme periods of anxiety - she is still using hydroxyzine PRN but feels it does not help much - offered and discussed counseling again today, explained its importance, pt declined at this time  Depression - Seroquel 50mg  qHS helping her sleep, have refilled this today and explained she could increase to 100mg  qHS if needed but monitor for  signs of somnolence  Pulmonary nodules/lesions, multiple - due for repeat low dose CT in July 2022, will schedule and call patient with appointment  Chronic obstructive pulmonary disease ([place]) - suspect COPD although she has not formally had PFTs, appt scheduled for 12/23/2020 with Dr Valentina Lucks - Sample for 2 weeks of Anoro Ellipta given to patient - escalation to LAMA/LABA as she was previously just on LAMA therapy, discussed to not take this the day before her PFT appointment to have accurate PFTs - this is a preferred inhaler for her plan so we could prescribe this at follow up if helping her breathing - discussed signs/symptoms of exacerbation of COPD which she does not have today     [name], MD [place]

## 2020-12-09 NOTE — Assessment & Plan Note (Addendum)
-   counseled extensively today that if she continues to take more benzodiazepines than prescribed as we attempt to taper that we will have to keep tapering and is a violation of her agreement with me - She has 12 tabs of Klonopin 1mg  left, has not taken yet today, so script sent for 1 mg BID to not be filled until 12/13/2020 (verified with Ms Rison that her pharmacy is open on Saturday), she has enough to get to that appt through 12/14/2020 with her today, and then I prescribed exactly 19 more days to get to her follow up appointment with me on 01/02/2021 - She has three tabs of Xanax 0.5mg  left, explained I also would not refill this until 12/13/2020 where I will give her 10 more tabs which is not enough for one every day and she needs to take them no more than once a day and only for extreme periods of anxiety - she is still using hydroxyzine PRN but feels it does not help much - offered and discussed counseling again today, explained its importance, pt declined at this time

## 2020-12-09 NOTE — Assessment & Plan Note (Addendum)
-   has not drank since last Thursday, congratulated patient, given AA number and other substance use resources, counseled on importance of continued cessation

## 2020-12-09 NOTE — Progress Notes (Signed)
Called patient, NA, LMAM

## 2020-12-09 NOTE — Progress Notes (Signed)
Pt aware.

## 2020-12-09 NOTE — Patient Instructions (Addendum)
It was wonderful to see you today.  Please bring ALL of your medications with you to every visit.   Today we talked about:  - Continue the Klonopin 1mg  twice daily NO MORE THAN THIS, I will refill you on Saturday 12/13/2020 for a script until I see you in July - Remember the 0.5 mg of Xanax is only for emergency use, not daily and no more than once per day, also on Saturday I will refill 10 more tabs and will not refill again before your next appointment - For your wheezing,  I have scheduled you an appointment with Dr Valentina Lucks on 12/23/2020 to do lung function testing, please do not take your new inhaler the day before to have most accurate tests - Please reach out to your cardiologist about scheduling your stress test and heart rhythm monitor - We will check you lab work today to recheck your sodium - Continue to abstain from alcohol- You CAN do this! - I have refilled your Seroquel for 50mg  each night, you can increase to two tabs if you need additional help sleeping, this will also help your mood     Thank you for choosing [facility].   Please call [phone] with any questions about today's appointment.  Please be sure to schedule follow up at the front  desk before you leave today.   Yehuda Savannah, MD  Family Medicine

## 2020-12-09 NOTE — Assessment & Plan Note (Signed)
-   due for repeat low dose CT in July 2022, will schedule and call patient with appointment

## 2020-12-09 NOTE — Assessment & Plan Note (Signed)
-   Seroquel 50mg  qHS helping her sleep, have refilled this today and explained she could increase to 100mg  qHS if needed but monitor for signs of somnolence

## 2020-12-09 NOTE — Assessment & Plan Note (Signed)
-   CHADS2VASC 1, off of Eliquis and metoprolol since yesterday per shared decision making with cardiology - patient encouraged to call her cardiologist to scheduled Ziopatch monitor and stress test, staff message sent to cardiology to assist with this

## 2020-12-09 NOTE — Progress Notes (Signed)
Normal chest x-ray.

## 2020-12-09 NOTE — Assessment & Plan Note (Signed)
-   just picked up amlodipine 5mg  yesterday, will continue

## 2020-12-10 ENCOUNTER — Other Ambulatory Visit: Payer: Self-pay | Admitting: Family Medicine

## 2020-12-10 DIAGNOSIS — I480 Paroxysmal atrial fibrillation: Secondary | ICD-10-CM

## 2020-12-10 LAB — COMPREHENSIVE METABOLIC PANEL
ALT: 7 IU/L (ref 0–32)
AST: 17 IU/L (ref 0–40)
Albumin/Globulin Ratio: 2 (ref 1.2–2.2)
Albumin: 4.2 g/dL (ref 3.8–4.8)
Alkaline Phosphatase: 97 IU/L (ref 44–121)
BUN/Creatinine Ratio: 5 — ABNORMAL LOW (ref 12–28)
BUN: 3 mg/dL — ABNORMAL LOW (ref 8–27)
Bilirubin Total: 0.5 mg/dL (ref 0.0–1.2)
CO2: 30 mmol/L — ABNORMAL HIGH (ref 20–29)
Calcium: 9.1 mg/dL (ref 8.7–10.3)
Chloride: 90 mmol/L — ABNORMAL LOW (ref 96–106)
Creatinine, Ser: 0.62 mg/dL (ref 0.57–1.00)
Globulin, Total: 2.1 g/dL (ref 1.5–4.5)
Glucose: 103 mg/dL — ABNORMAL HIGH (ref 65–99)
Potassium: 4.1 mmol/L (ref 3.5–5.2)
Sodium: 134 mmol/L (ref 134–144)
Total Protein: 6.3 g/dL (ref 6.0–8.5)
eGFR: 101 mL/min/{1.73_m2} (ref >59)

## 2020-12-11 ENCOUNTER — Encounter: Payer: Self-pay | Admitting: Physical Medicine and Rehabilitation

## 2020-12-16 ENCOUNTER — Telehealth: Payer: Self-pay | Admitting: Family Medicine

## 2020-12-16 ENCOUNTER — Telehealth: Payer: Self-pay

## 2020-12-16 NOTE — Telephone Encounter (Signed)
Received VM on nurse line requesting returned phone call from provider. Patient states that it is an emergency.   Returned call to patient. Patient reports that she ran out of Klonopin over the weekend. Patient was able to take one Klonopin on Saturday. Patient states that pharmacy was unable to dispense until today. Patient has been taking alprazolam to get her through until refill.   Patient states that provider "needs to stop being so overbearing with medication" Advised patient that provider sent medication with instructions to be dispensed on 7/2. Patient states that pharmacy stated they could not dispense early due to state regulations.   Patient begins stating that she needs helps with regaining strength in her legs. Patient is requesting outpatient rehab. Patient states that she does not want home health PT.   Patient is requesting returned phone call from provider to discuss above issues further. Please return call to patient at [phone].  Talbot Grumbling, RN

## 2020-12-16 NOTE — Telephone Encounter (Signed)
Called and spoke with patient. She was unable to pick up Klonopin over the weekend but was able to pick up the Xanax 0.5mg  tabs, so she has taken those over the weekend because she was worried about withdrawing. She states she tried to call our clinic emergency line and was directed to behavioral health. She has taken 0.24m Xanax so far today, notes she has 4 tabs of that left. She was able to pick up her Klonopin 1mg  tabs today but has not taken any yet. Has not had any seizures. Did have 3 wine coolers on Saturday because she was anxious. I instructed her the risk of drinking and to avoid any further alcohol and to take a Klonopin 1mg  tab now, and again tonight before going to bed if feeling anxious, and then resume her 1 mg BID regimen tomorrow.   Discussed she got a call from PT and cannot be seen until September, wanting to do it sooner. Will send referral message to see if anything else can be done sooner.  Also discussed she is using her new inhaler but feels her breathing is acting up "because of the humidity." She was speaking in complete sentences without distress during our call. Denies chest pain, dyspnea, fevers. Discussed use of her PRN albuterol and reasons to go  to ED including worsening dyspnea, chest pain. Offered her a clinic appointment with another provider this week as I do not have availability and she declined.  Answered all patient questions and concerns, ED return precautions addressed. Also discussed needs to call clinic if out of benzodiazepine over the weekend due to risk of withdrawal seizures.  Yehuda Savannah MD

## 2020-12-17 ENCOUNTER — Telehealth: Payer: Self-pay

## 2020-12-17 ENCOUNTER — Other Ambulatory Visit: Payer: Self-pay | Admitting: Family Medicine

## 2020-12-17 DIAGNOSIS — M545 Low back pain, unspecified: Secondary | ICD-10-CM

## 2020-12-17 DIAGNOSIS — J449 Chronic obstructive pulmonary disease, unspecified: Secondary | ICD-10-CM

## 2020-12-17 DIAGNOSIS — G8929 Other chronic pain: Secondary | ICD-10-CM

## 2020-12-17 DIAGNOSIS — S22000A Wedge compression fracture of unspecified thoracic vertebra, initial encounter for closed fracture: Secondary | ICD-10-CM

## 2020-12-17 MED ORDER — UMECLIDINIUM-VILANTEROL 62.5-25 MCG/INH IN AEPB: 1.0000 | INHALATION_SPRAY | Freq: Every day | RESPIRATORY_TRACT | 1 refills | Status: DC

## 2020-12-17 NOTE — Telephone Encounter (Signed)
Patient contacted and informed of medication to pharmacy. Patient counseled on how to use both inhalers correctly. Patient was speaking in full sentences.   Patient also is requesting a walker with a seat cushion, so she can get out more and rest when needed.   Please let me know when order has been placed.

## 2020-12-17 NOTE — Telephone Encounter (Signed)
Patient calls nurse line requesting a refill on Anoro inhaler. I see where she was given a sample last week. Patient reports she is out. Patient is speaking in full sentences. Please advise.

## 2020-12-18 ENCOUNTER — Other Ambulatory Visit: Payer: Self-pay | Admitting: Family Medicine

## 2020-12-18 NOTE — Telephone Encounter (Signed)
DME rolling walker ordered. Thanks.

## 2020-12-22 ENCOUNTER — Other Ambulatory Visit: Payer: Self-pay

## 2020-12-22 ENCOUNTER — Other Ambulatory Visit: Payer: Self-pay | Admitting: Family Medicine

## 2020-12-22 DIAGNOSIS — F419 Anxiety disorder, unspecified: Secondary | ICD-10-CM

## 2020-12-22 NOTE — Telephone Encounter (Signed)
Patient leaves 2 VM back to back requesting a refill on xanax. Patient reports she has run out and she is having a real hard time. I attempted to call patient back, however no answer. Will forward to PCP.

## 2020-12-23 ENCOUNTER — Telehealth: Payer: Self-pay | Admitting: Family Medicine

## 2020-12-23 ENCOUNTER — Ambulatory Visit: Payer: 59 | Admitting: Pharmacist

## 2020-12-23 NOTE — Telephone Encounter (Signed)
Attempted to call x3 this morning, line ringing as busy and unable to receive voicemail. She should have Klonopin 1 mg BID to take until next appt on 01/02/2021, Xanax was only to be using sparingly.  Will try again later. Yehuda Savannah MD

## 2020-12-23 NOTE — Telephone Encounter (Signed)
Attempted to call 3 different times after clinic, line ringing busy and unable to leave VM. Will attempt again tomorrow.  Yehuda Savannah MD

## 2020-12-23 NOTE — Telephone Encounter (Signed)
Confirmation received by [employer].

## 2020-12-24 ENCOUNTER — Other Ambulatory Visit: Payer: Self-pay | Admitting: Family Medicine

## 2020-12-24 ENCOUNTER — Telehealth: Payer: Self-pay | Admitting: Family Medicine

## 2020-12-24 DIAGNOSIS — S22000A Wedge compression fracture of unspecified thoracic vertebra, initial encounter for closed fracture: Secondary | ICD-10-CM

## 2020-12-24 NOTE — Telephone Encounter (Signed)
Notes she picked up the Klonopin 1 mg on 7/5 and has been taking it BID since. She has not had Xanax since 12/17/20 and feels like sometimes she needs it to get through a period of anxiety.  I explained I would refill the 10 tabs but she is to take no more than one per day and not to use every day if she can help it. She is in agreement with this plan and states she is doing better overall.  She also asked about inhaler refill which I have already sent in. Denies chest pain or worsening SOB, feels her breathing is improved on the new inhaler. We reviewed ED precautions.  Answered all questions and concerns. PDMP reviewed. Xanax 0.5 mg #10 sent in. Next appt on 01/02/2021 with myself.  Yehuda Savannah MD

## 2020-12-24 NOTE — Telephone Encounter (Signed)
Patient calls back to nurse line to given alternate phone number.   [phone]

## 2020-12-24 NOTE — Telephone Encounter (Signed)
Patient calls nurse line returning call to PCP. Patient reports she has used all of her xanax due to not being able to pick up her Klonopin until 7/5.   Patient reports she is sorry she missed all of PCP calls. Patient states she will keep her phone her.  Please advise.

## 2020-12-25 ENCOUNTER — Other Ambulatory Visit: Payer: Self-pay | Admitting: Student

## 2020-12-25 DIAGNOSIS — R072 Precordial pain: Secondary | ICD-10-CM

## 2020-12-31 ENCOUNTER — Other Ambulatory Visit: Payer: Self-pay | Admitting: Family Medicine

## 2020-12-31 DIAGNOSIS — F32A Depression, unspecified: Secondary | ICD-10-CM

## 2021-01-01 ENCOUNTER — Other Ambulatory Visit: Payer: 59

## 2021-01-02 ENCOUNTER — Encounter (HOSPITAL_COMMUNITY): Payer: Self-pay | Admitting: Emergency Medicine

## 2021-01-02 ENCOUNTER — Other Ambulatory Visit: Payer: Self-pay

## 2021-01-02 ENCOUNTER — Emergency Department (HOSPITAL_COMMUNITY): Payer: 59

## 2021-01-02 ENCOUNTER — Observation Stay (HOSPITAL_COMMUNITY)
Admission: EM | Admit: 2021-01-02 | Discharge: 2021-01-05 | Disposition: A | Discharge status: Home / Self Care | Payer: 59 | Attending: Family Medicine | Admitting: Family Medicine

## 2021-01-02 ENCOUNTER — Other Ambulatory Visit: Payer: Self-pay | Admitting: Family Medicine

## 2021-01-02 ENCOUNTER — Ambulatory Visit (INDEPENDENT_AMBULATORY_CARE_PROVIDER_SITE_OTHER): Payer: 59 | Admitting: Family Medicine

## 2021-01-02 VITALS — BP 107/79 | HR 117 | Ht 66.0 in | Wt 176.8 lb

## 2021-01-02 DIAGNOSIS — Z7901 Long term (current) use of anticoagulants: Secondary | ICD-10-CM | POA: Diagnosis not present

## 2021-01-02 DIAGNOSIS — R1110 Vomiting, unspecified: Secondary | ICD-10-CM

## 2021-01-02 DIAGNOSIS — F419 Anxiety disorder, unspecified: Secondary | ICD-10-CM

## 2021-01-02 DIAGNOSIS — F13230 Sedative, hypnotic or anxiolytic dependence with withdrawal, uncomplicated: Secondary | ICD-10-CM

## 2021-01-02 DIAGNOSIS — R1013 Epigastric pain: Secondary | ICD-10-CM

## 2021-01-02 DIAGNOSIS — Z79899 Other long term (current) drug therapy: Secondary | ICD-10-CM | POA: Insufficient documentation

## 2021-01-02 DIAGNOSIS — K3189 Other diseases of stomach and duodenum: Secondary | ICD-10-CM | POA: Diagnosis not present

## 2021-01-02 DIAGNOSIS — Z20822 Contact with and (suspected) exposure to covid-19: Secondary | ICD-10-CM | POA: Diagnosis not present

## 2021-01-02 DIAGNOSIS — K449 Diaphragmatic hernia without obstruction or gangrene: Principal | ICD-10-CM | POA: Insufficient documentation

## 2021-01-02 DIAGNOSIS — F17210 Nicotine dependence, cigarettes, uncomplicated: Secondary | ICD-10-CM | POA: Diagnosis not present

## 2021-01-02 DIAGNOSIS — R531 Weakness: Secondary | ICD-10-CM

## 2021-01-02 DIAGNOSIS — R112 Nausea with vomiting, unspecified: Secondary | ICD-10-CM

## 2021-01-02 DIAGNOSIS — J449 Chronic obstructive pulmonary disease, unspecified: Secondary | ICD-10-CM | POA: Insufficient documentation

## 2021-01-02 DIAGNOSIS — I480 Paroxysmal atrial fibrillation: Secondary | ICD-10-CM | POA: Insufficient documentation

## 2021-01-02 DIAGNOSIS — R197 Diarrhea, unspecified: Secondary | ICD-10-CM

## 2021-01-02 DIAGNOSIS — K760 Fatty (change of) liver, not elsewhere classified: Secondary | ICD-10-CM | POA: Clinically undetermined

## 2021-01-02 DIAGNOSIS — K529 Noninfective gastroenteritis and colitis, unspecified: Secondary | ICD-10-CM

## 2021-01-02 DIAGNOSIS — E876 Hypokalemia: Secondary | ICD-10-CM | POA: Diagnosis not present

## 2021-01-02 DIAGNOSIS — F13930 Sedative, hypnotic or anxiolytic use, unspecified with withdrawal, uncomplicated: Secondary | ICD-10-CM

## 2021-01-02 LAB — URINALYSIS, ROUTINE W REFLEX MICROSCOPIC
Bilirubin Urine: NEGATIVE
Glucose, UA: NEGATIVE mg/dL
Ketones, ur: NEGATIVE mg/dL
Nitrite: NEGATIVE
Protein, ur: NEGATIVE mg/dL
Specific Gravity, Urine: 1.009 (ref 1.005–1.030)
pH: 7 (ref 5.0–8.0)

## 2021-01-02 LAB — CBC
HCT: 49 % — ABNORMAL HIGH (ref 36.0–46.0)
Hemoglobin: 16.5 g/dL — ABNORMAL HIGH (ref 12.0–15.0)
MCH: 31 pg (ref 26.0–34.0)
MCHC: 33.7 g/dL (ref 30.0–36.0)
MCV: 92.1 fL (ref 80.0–100.0)
Platelets: 221 10*3/uL (ref 150–400)
RBC: 5.32 MIL/uL — ABNORMAL HIGH (ref 3.87–5.11)
RDW: 15.2 % (ref 11.5–15.5)
WBC: 8.4 10*3/uL (ref 4.0–10.5)
nRBC: 0 % (ref 0.0–0.2)

## 2021-01-02 LAB — BASIC METABOLIC PANEL
Anion gap: 12 (ref 5–15)
BUN: 5 mg/dL — ABNORMAL LOW (ref 8–23)
CO2: 25 mmol/L (ref 22–32)
Calcium: 8 mg/dL — ABNORMAL LOW (ref 8.9–10.3)
Chloride: 95 mmol/L — ABNORMAL LOW (ref 98–111)
Creatinine, Ser: 0.88 mg/dL (ref 0.44–1.00)
GFR, Estimated: >60 mL/min (ref >60)
Glucose, Bld: 118 mg/dL — ABNORMAL HIGH (ref 70–99)
Potassium: 2.7 mmol/L — CL (ref 3.5–5.1)
Sodium: 132 mmol/L — ABNORMAL LOW (ref 135–145)

## 2021-01-02 LAB — LIPASE, BLOOD: Lipase: 28 U/L (ref 11–51)

## 2021-01-02 LAB — HEPATIC FUNCTION PANEL
ALT: 20 U/L (ref 0–44)
AST: 34 U/L (ref 15–41)
Albumin: 3.3 g/dL — ABNORMAL LOW (ref 3.5–5.0)
Alkaline Phosphatase: 91 U/L (ref 38–126)
Bilirubin, Direct: 0.2 mg/dL (ref 0.0–0.2)
Indirect Bilirubin: 0.4 mg/dL (ref 0.3–0.9)
Total Bilirubin: 0.6 mg/dL (ref 0.3–1.2)
Total Protein: 6.2 g/dL — ABNORMAL LOW (ref 6.5–8.1)

## 2021-01-02 LAB — POTASSIUM: Potassium: 2.8 mmol/L — ABNORMAL LOW (ref 3.5–5.1)

## 2021-01-02 LAB — MAGNESIUM: Magnesium: 0.8 mg/dL — CL (ref 1.7–2.4)

## 2021-01-02 MED ORDER — CLONAZEPAM 0.5 MG PO TABS
1.0000 mg | ORAL_TABLET | Freq: Two times a day (BID) | ORAL | Status: DC
  Administered 2021-01-02 – 2021-01-05 (×6): 1 mg via ORAL
  Filled 2021-01-02 (×6): qty 2

## 2021-01-02 MED ORDER — MAGNESIUM SULFATE 2 GM/50ML IV SOLN
2.0000 g | Freq: Once | INTRAVENOUS | Status: AC
  Administered 2021-01-02: 2 g via INTRAVENOUS
  Filled 2021-01-02: qty 50

## 2021-01-02 MED ORDER — ALPRAZOLAM 0.5 MG PO TABS: ORAL_TABLET | ORAL | 0 refills | Status: DC

## 2021-01-02 MED ORDER — PANTOPRAZOLE SODIUM 40 MG PO TBEC: 40.0000 mg | DELAYED_RELEASE_TABLET | Freq: Every day | ORAL | Status: DC

## 2021-01-02 MED ORDER — PROCHLORPERAZINE EDISYLATE 10 MG/2ML IJ SOLN
10.0000 mg | INTRAMUSCULAR | Status: DC | PRN
  Administered 2021-01-04: 10 mg via INTRAVENOUS
  Filled 2021-01-02: qty 2

## 2021-01-02 MED ORDER — CLONAZEPAM 1 MG PO TABS: 1.0000 mg | ORAL_TABLET | Freq: Two times a day (BID) | ORAL | 0 refills | Status: DC

## 2021-01-02 MED ORDER — SODIUM CHLORIDE 0.9 % IV SOLN: INTRAVENOUS | Status: AC

## 2021-01-02 MED ORDER — ALPRAZOLAM 0.5 MG PO TABS
0.5000 mg | ORAL_TABLET | Freq: Every day | ORAL | Status: DC | PRN
  Administered 2021-01-03 – 2021-01-05 (×3): 0.5 mg via ORAL
  Filled 2021-01-02 (×3): qty 1

## 2021-01-02 MED ORDER — UMECLIDINIUM-VILANTEROL 62.5-25 MCG/INH IN AEPB
1.0000 | INHALATION_SPRAY | Freq: Every day | RESPIRATORY_TRACT | Status: DC
  Administered 2021-01-03 – 2021-01-05 (×2): 1 via RESPIRATORY_TRACT
  Filled 2021-01-02: qty 14

## 2021-01-02 MED ORDER — CLONAZEPAM 0.5 MG PO TABS
0.5000 mg | ORAL_TABLET | Freq: Once | ORAL | Status: AC
  Administered 2021-01-02: 0.5 mg via ORAL
  Filled 2021-01-02: qty 1

## 2021-01-02 MED ORDER — IOHEXOL 300 MG/ML  SOLN
100.0000 mL | Freq: Once | INTRAMUSCULAR | Status: AC | PRN
  Administered 2021-01-02: 100 mL via INTRAVENOUS

## 2021-01-02 MED ORDER — POTASSIUM CHLORIDE 10 MEQ/100ML IV SOLN
10.0000 meq | INTRAVENOUS | Status: AC
Start: 2021-01-02 — End: 2021-01-02
  Administered 2021-01-02 (×3): 10 meq via INTRAVENOUS
  Filled 2021-01-02 (×3): qty 100

## 2021-01-02 MED ORDER — ALPRAZOLAM 0.25 MG PO TABS: 0.5000 mg | ORAL_TABLET | Freq: Every day | ORAL | Status: DC | PRN

## 2021-01-02 MED ORDER — CALCITONIN (SALMON) 200 UNIT/ACT NA SOLN
1.0000 | Freq: Every day | NASAL | Status: DC
  Administered 2021-01-03: 1 via NASAL
  Filled 2021-01-02: qty 3.7

## 2021-01-02 MED ORDER — LORAZEPAM 2 MG/ML IJ SOLN
1.0000 mg | Freq: Once | INTRAMUSCULAR | Status: AC
  Administered 2021-01-02: 1 mg via INTRAVENOUS
  Filled 2021-01-02: qty 1

## 2021-01-02 MED ORDER — POTASSIUM CHLORIDE 10 MEQ/100ML IV SOLN
10.0000 meq | INTRAVENOUS | Status: AC
  Administered 2021-01-02 – 2021-01-03 (×3): 10 meq via INTRAVENOUS
  Filled 2021-01-02 (×3): qty 100

## 2021-01-02 MED ORDER — QUETIAPINE FUMARATE 50 MG PO TABS
50.0000 mg | ORAL_TABLET | Freq: Every day | ORAL | Status: DC
  Administered 2021-01-02 – 2021-01-04 (×3): 50 mg via ORAL
  Filled 2021-01-02 (×3): qty 1

## 2021-01-02 MED ORDER — ALPRAZOLAM 0.25 MG PO TABS: 0.5000 mg | ORAL_TABLET | Freq: Every day | ORAL | Status: DC

## 2021-01-02 MED ORDER — SODIUM CHLORIDE 0.9 % IV BOLUS
1000.0000 mL | Freq: Once | INTRAVENOUS | Status: AC
  Administered 2021-01-02: 1000 mL via INTRAVENOUS

## 2021-01-02 MED ORDER — ONDANSETRON 8 MG PO TBDP: 8.0000 mg | ORAL_TABLET | Freq: Three times a day (TID) | ORAL | 0 refills | Status: DC | PRN

## 2021-01-02 MED ORDER — ONDANSETRON HCL 4 MG/2ML IJ SOLN
4.0000 mg | Freq: Once | INTRAMUSCULAR | Status: AC
  Administered 2021-01-02: 4 mg via INTRAVENOUS
  Filled 2021-01-02: qty 2

## 2021-01-02 MED ORDER — ALBUTEROL SULFATE (2.5 MG/3ML) 0.083% IN NEBU: 3.0000 mL | INHALATION_SOLUTION | Freq: Four times a day (QID) | RESPIRATORY_TRACT | Status: DC | PRN

## 2021-01-02 MED ORDER — POTASSIUM CHLORIDE CRYS ER 20 MEQ PO TBCR: 20.0000 meq | EXTENDED_RELEASE_TABLET | Freq: Two times a day (BID) | ORAL | 0 refills | Status: DC

## 2021-01-02 MED ORDER — ACETAMINOPHEN 500 MG PO TABS: 1000.0000 mg | ORAL_TABLET | Freq: Four times a day (QID) | ORAL | Status: DC | PRN

## 2021-01-02 MED ORDER — POTASSIUM CHLORIDE CRYS ER 20 MEQ PO TBCR
40.0000 meq | EXTENDED_RELEASE_TABLET | Freq: Once | ORAL | Status: AC
  Administered 2021-01-02: 40 meq via ORAL
  Filled 2021-01-02: qty 2

## 2021-01-02 MED ORDER — HYDROXYZINE HCL 25 MG PO TABS
25.0000 mg | ORAL_TABLET | Freq: Four times a day (QID) | ORAL | Status: DC
  Administered 2021-01-02 – 2021-01-05 (×10): 25 mg via ORAL
  Filled 2021-01-02 (×10): qty 1

## 2021-01-02 MED ORDER — GABAPENTIN 600 MG PO TABS
1200.0000 mg | ORAL_TABLET | Freq: Three times a day (TID) | ORAL | Status: DC
  Administered 2021-01-02 – 2021-01-05 (×9): 1200 mg via ORAL
  Filled 2021-01-02 (×9): qty 2

## 2021-01-02 MED ORDER — ENOXAPARIN SODIUM 40 MG/0.4ML IJ SOSY: 40.0000 mg | PREFILLED_SYRINGE | INTRAMUSCULAR | Status: DC

## 2021-01-02 NOTE — Telephone Encounter (Signed)
Patient returns call to nurse line. Patient has left 3 VM regarding refill request for klonopin and xanax. Patient states "Dr. Thompson Grayer promised that she would send these in this morning."  Please advise.  Talbot Grumbling, RN

## 2021-01-02 NOTE — Progress Notes (Addendum)
Family Medicine Teaching Service Daily Progress Note Intern Pager: [phone]  Patient name: Karen Casey Medical record number: [id number] Date of birth: 06-23-1955 Age: 65 y.o. Gender: female  Primary Care Provider: [name], MD Consultants:  Code Status: FULL   Pt Overview and Major Events to Date:  7/22: admitted  Assessment and Plan:  Karen Casey is a 65 y.o. female presenting with vomiting and diarrhea . PMH is significant for anxiety, depression, seizure disorder, GERD, HTN, ETOH abuse and PAF.  Hypokalemia and hypomagnesemia  K 3.7, Mg 1.9 early this morning. Improved post repletion 4g Mg and 70 meq Kcl. Pending latest BMP and Mg.  -Monitor BMP and Mgg -Replete K and Mg as necessary -F/u phosp -Consider holding Seroquel if persistent electrolyte disturbances despite repletion -Hold PPI -Nutrition consult -EKG -Fall precautions  -Lovenox for DVT prophylaxis -Med review with pharmacy team to stop any offending drugs   Nausea, vomiting, diarrhea No further episodes of nausea, vomiting or diarrhea overnight  -Normal saline 110 cc/hr -Compazine 10 mg q4h -monitor BMP; K & Mg q4h -consider GI consult with worsening symptoms   Hyponatremia Na today 135.Improved from baseline 121-123. 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 Treated in the past with sodium tablets, fluid restriction and discontinuation of Celexa and Cymbalta -Monitor with BMP    Prolonged QT Patient Qt on admission 405 -Avoid meds that prolong QT -Monitor QT daily   Paroxysmal Afib CHADS2VASC 1, off Eliquis and metoprolol since June per shared decision making with cardiology. EKG: sinus tachy with PVCs -Continuous cardiac monitoring    Hypertension BP 119/76 Home meds: Amlodipine 5 mg daily  -Hold amlodipine 5 mg   Depression  Anxiety  h/o benzodiazepine withdrawal seizures  Anxiety appears well controlled this morning.   Home medication includes Clonazepam 1g BID, Xanax 0.'5mg'$  daily, hydroxyzine '25mg'$  4 times a day  -Continue Clonazepam 1g BID , Xanax 0.'5mg'$  daily, hydroxyzine '25mg'$  4 times a day -Consider holding seroquel if persistent electrolyte disturbances despite repletion -Continue to wean the above medications as an outpatient -Monitor for signs of withdrawal  -Delirium precautions   Alcohol use disorder Patient has a history of alcohol use disorder she was recently hospitalized for alcohol withdrawal seizures. Reports last drink yesterday- drank 4oz of flavoured beer which was her first drink since March. Denies drinking ETOH. CIWAs 0 overnight. -CIWA protocol, no ativan    Chronic hypoxemic respiratory failure Sats 96% on RA -Monitor respiratory status -PRN oxygen at night if needed    GERD Home meds: pantoprazole 40 mg daily -Hold pantoprazole as it can contribute to hypomagnesemia    Tobacco use disorder Smokes pack a day but over the last few days she has only smoked 2-3 in the last few days. -Nicotine patch '14mg'$  patch    Chronic pain Home meds: gabapentin '600mg'$  TID, tylenol '1000mg'$  Q6H -Continue gabapentin  '600mg'$  TID -Continue tylenol '1000mg'$  Q6H   Seasonal allergies Home medications include Flonase -Continue home Flonase  FEN/GI: ADAT, N.saline 110 cc/hr  PPx: lovenox  Dispo:Home tomorrow. Barriers include electrolyte abnormalities.   Subjective:  No acute concerns over night. Denies nausea,vomiting or diarrhea overnight.  Objective: Temp:  [98.4 F (36.9 C)] 98.4 F (36.9 C) (07/22 1010) Pulse Rate:  [92-117] 92 (07/22 2030) Resp:  [11-28] 21 (07/22 2030) BP: (98-154)/(64-99) 132/68 (07/22 2030) SpO2:  [89 %-98 %] 91 % (07/22 2030) Weight:  [80.2 kg] 80.2 kg (07/22 0913)  Physical Exam:  General: Alert, no acute distress,  Cardio: Normal S1 and S2, RRR, no r/m/g Pulm: mild wheeze otherwise clear  Abdomen: Bowel sounds normal. Abdomen soft, mildly tender RUQ   Extremities: No peripheral edema.  Neuro: Cranial nerves grossly intact   Laboratory: Recent Labs  Lab 01/02/21 1021  WBC 8.4  HGB 16.5*  HCT 49.0*  PLT 221   Recent Labs  Lab 01/02/21 1021 01/02/21 1328 01/02/21 1705  NA 132*  --   --   K 2.7*  --  2.8*  CL 95*  --   --   CO2 25  --   --   BUN 5*  --   --   CREATININE 0.88  --   --   CALCIUM 8.0*  --   --   PROT  --  6.2*  --   BILITOT  --  0.6  --   ALKPHOS  --  91  --   ALT  --  20  --   AST  --  34  --   GLUCOSE 118*  --   --      Imaging/Diagnostic Tests: CT ABDOMEN PELVIS W CONTRAST  Result Date: 01/02/2021 CLINICAL DATA:  Acute generalized abdominal pain. EXAM: CT ABDOMEN AND PELVIS WITH CONTRAST TECHNIQUE: Multidetector CT imaging of the abdomen and pelvis was performed using the standard protocol following bolus administration of intravenous contrast. CONTRAST:  131m OMNIPAQUE IOHEXOL 300 MG/ML  SOLN COMPARISON:  Oct 12, 2020. FINDINGS: Lower chest: No acute abnormality. Hepatobiliary: No gallstones or biliary dilatation is noted. Probable hepatic steatosis. Pancreas: Unremarkable. No pancreatic ductal dilatation or surrounding inflammatory changes. Spleen: Normal in size without focal abnormality. Adrenals/Urinary Tract: Adrenal glands are unremarkable. Kidneys are normal, without renal calculi, focal lesion, or hydronephrosis. Bladder is unremarkable. Stomach/Bowel: The stomach appears normal. There is no evidence of bowel obstruction or inflammation. Sigmoid diverticulosis is noted. Vascular/Lymphatic: Aortic atherosclerosis. No enlarged abdominal or pelvic lymph nodes. Reproductive: Uterus and bilateral adnexa are unremarkable. Other: No abdominal wall hernia or abnormality. No abdominopelvic ascites. Musculoskeletal: No acute or significant osseous findings. IMPRESSION: Probable hepatic steatosis. Sigmoid diverticulosis without inflammation. No acute abnormality seen in the abdomen or pelvis. Aortic Atherosclerosis  (ICD10-I70.0). Electronically Signed   By: JMarijo ConceptionM.D.   On: 01/02/2021 15:57     PLattie Haw MD 01/02/2021, 11:39 PM PGY-3, CReidIntern pager: [phone] text pages welcome

## 2021-01-02 NOTE — Assessment & Plan Note (Addendum)
Concerned for intra-abdominal process such as pancreatitis or diverticulitis, esp with history of subjective fever. Shared decision making, she is going by private vehicle to ED. Recommend CBC/CMP/lipase, abd CT. She looks dehydrated and needs IVF resuscitation, as well IV benzodiazepines to prevent withdrawal as she has a history of withdrawal seizures. Callled and discussed this with ED charge RN. Pt declined being wheeled over to ED, will go with sister who will drive her now by private vehicle.

## 2021-01-02 NOTE — Progress Notes (Signed)
    SUBJECTIVE:   CHIEF COMPLAINT / HPI:   Diarrhea, nausea, and vomiting- for the past week, extreme nausea, + epigastric pain, 8+ liquid stools per day, no blood in them. Fevers and chills for the past few days, feels like the last time she had diverticulitis, but does note stools have been present since she left the hospital. Denies a history of Cdiff or pancreatitis. Last drank alcohol last week had 2 wine coolers. Feels like she "is going to lose it."  Anxiety- on Klonopin '1mg'$  BID, with PRN 0.5 mg Xanax once daily as needed. She has been using the Xanax daily. For the past three days she has been vomiting and unable to keep her benzodiazepines down. She has not been able to eat anything, had a cheese sandwich in past few days, has been drinking 4 bottles of water per day.  Atypical chest pain- has mammogram, breast US, and Lexiscan scheduled in the future.  PERTINENT  PMH / PSH: chronic benzo use, history of withdrawal seizure, SIADH with hyponatremia, alcohol use disorder  OBJECTIVE:   BP 107/79   Pulse (!) 117   Ht '5\' 6"'$  (1.676 m)   Wt 176 lb 12.8 oz (80.2 kg)   SpO2 97%   BMI 28.54 kg/m   General: A&O, NAD HEENT: No sign of trauma, EOM grossly intact Cardiac: tachycardic, no m/r/g Respiratory: CTAB, normal WOB, no w/c/r GI: Soft, mild diffuse TTP worse in epigastric region, + voluntary guarding, no rebound, + bowel sounds non-distended  Extremities: NTTP, no peripheral edema. Neuro: Normal gait, moves all four extremities appropriately. Psych: appears anxious, tearful   ASSESSMENT/PLAN:   Intractable nausea and vomiting Concerned for intra-abdominal process such as pancreatitis or diverticulitis, esp with history of subjective fever. Shared decision making, she is going by private vehicle to ED. Recommend CBC/CMP/lipase, abd CT. She looks dehydrated and needs IVF resuscitation, as well IV benzodiazepines to prevent withdrawal as she has a history of withdrawal seizures.  Callled and discussed this with ED charge RN. Pt declined being wheeled over to ED, will go with sister who will drive her now by private vehicle.     [name], MD [place]

## 2021-01-02 NOTE — Discharge Instructions (Addendum)
Dear Karen Casey,  Thank you for letting us participate in your care. You were hospitalized for 3 days and diagnosed with  Hypokalemia and Hypomagnesia. You were treated with IV Potassium and Magnesium.   POST-HOSPITAL & CARE INSTRUCTIONS Follow up with the GI team on the result for your biopsy Follow up with PCP to get outpatient referral for colonoscopy  You are being given a prescription for Zofran for nausea.  Please use this if needed. Drink clear liquids only for the next 12 hours.You may begin to advance your diet after that if you are tolerating liquids without difficulty. Please take the potassium for the next 5 days.   Go to your follow up appointments (listed below) Please avoid alcohol Stop taking the Calcitonin.     DOCTOR'S APPOINTMENT   Future Appointments  Date Time Provider [place]  01/08/2021  8:30 AM [facility] LAB [facility] [place]  01/08/2021  9:00 AM Leavy Cella, RPH-CPP [facility] [place]  01/12/2021 12:30 PM WL-CT 2 WL-CT [place]  01/14/2021  8:30 AM [name], MD [facility]  01/26/2021  8:30 AM PCV-NM 1 PCV-IMG None  02/04/2021  8:40 AM GI-BCG DIAG [place]OMO 1 GI-BCGMM GI-BREAST CE  02/04/2021  8:50 AM GI-BCG [place] 1 GI-BCGUS GI-BREAST CE  02/04/2021  8:55 AM GI-BCG [place] 1 GI-BCGUS GI-BREAST CE  02/20/2021 10:20 AM [place], Jinny Blossom, MD CPR-PRMA CPR  03/10/2021 10:00 AM [place], Gerline Legacy, PA-C PCV-PCV None    Follow-up Information     [name], MD. Go on 01/14/2021.   Specialty: Family Medicine Why: At 8:30am. Please arrive by 8:15am. This is your hospital follow up appointment with your primary care doctor, Dr. Thompson Grayer. If this day and time does not work for you, please call the office directly to change the appointment. Contact information: [place] [address] [phone]         [place]. Go on 01/08/2021.   Specialty: Family Medicine Why: 8:30am - lab appointment to recheck blood electrolytes 9am -  medication follow up with our clinical pharmacist, Dr. Rolm Bookbinder information: [address] [id number] [place] [phone]                Take care and be well!  [facility]  [place], [place] [address] [phone]

## 2021-01-02 NOTE — ED Triage Notes (Signed)
Pt endorses weakness since Tuesday. States she has had diarrhea for months. Endorses neck and abd pain.

## 2021-01-02 NOTE — H&P (Addendum)
[facility] Admission History and Physical Service Pager: [phone]  Patient name: Karen Casey Medical record number: [id number] Date of birth: 1955/06/16 Age: 65 y.o. Gender: female  Primary Care Provider: [name], MD Consultants:  Code Status: FULL  Preferred Emergency Contact:  Contact Information     Name Relation Home Work Mobile   Karen Casey Sister Karen Casey other Karen Casey Daughter [phone]  [phone]        Chief Complaint: vomiting, diarrhea   Assessment and Plan: Karen Casey is a 65 y.o. female presenting with vomiting and diarrhea . PMH is significant for hypertension, A. fib, hyponatremia, pancreatitis and diverticulitis, depression and alcohol use disorder.  Hypokalemia and Hypomagnesemia  Karen Casey a 65 y.o female came to the ED upon advice of her PCP for abdominal CT and IV fluid resuscitation due to several episodes of nausea, vomiting and diarrhea. Patient's lab in the ED showed Na 132, K 2.7, Mg 0.8, Cl 95, Co2 25, Ca 8.0, Albumin 3.3, and Creatinine 0.88. Despite aggressive repletion in the ER K 2.8, so pt was admitted for severe electrolyte abnormalities Most likely cause for hypokalemia and hypomagnesemia is due to recent diarrhea, vomiting in setting of recent severe malnutrition. Other specific causes of hypokalemia include drug such as insulin,diuretics, gentamicin, antipsychotics and RTA, polyuria etc. Causes of hypomagnesemia include PPI, malnutrition, diarrhea, malabsorption, pancreatitis etc. Pt is on Seroquel and Protonix which could be contributing to her electrolyte disturbances. S/p magnesium sulfate 2 g x 2 doses, 70 mEq of potassium in the ER. Will admit for further electrolyte repletion.  -Admit to med telemetry, observation, Dr. Ardelia Mems attending -Continuous telemetry  -Vitals per floor routine -Monitor BMP(K, Mg) every 4 hours x 3  overnight -Replete K and Mg as necessary  -check phosp  -Consider holding Seroquel if persistent electrolyte disturbances despite repletion  -Hold PPI  -Nutrition consult  -Regular diet  -Advance diet as tolerated -EKG -Fall precautions  -Lovenox for DVT prophylaxis -Med review with pharmacy team to stop any offending drugs  Nausea, vomiting, diarrhea Patient reports nausea, vomiting and diarrhea for the past 3 days. She reports having loose stool shortly after eating cheese last night. She said she has had 7 bowel movement with nausea and vomiting since last night. Patient denies noticing any blood in stool or vomit. She has had no recent travel history or sick contact. Patient report about 18 lbs weight loss since March. She is afebrile with stable vitals and normal WBC of 8.4. Her physical exam was unremarkable aside from tenderness on on her ribs bilaterally most likely soreness from prolonged episodes of wretching and vomiting. Possible differential to consider include pancreatitis and diverticulitis however unlikely given CT abdomen pelvis was negative for this and lipase 28. Other differentials include colitis and gastritis. Last colonoscopy and EGD was in 2014: normal, biopsies taken at this time to rule out microscopic colitis and esophagitis.  -Compazine 10 mg q4h  -monitor BMP; K & Mg q4h -consider GI consult with worsening symptoms  Hyponatremia Na 132 on admission.Improved from baseline 121-123. 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 Treated in the past with sodium tablets, fluid restriction and discontinuation of Celexa and Cymbalta -Continue to monitor   Prolonged QT Patient Qt on admission 405 -Avoid meds that prolong QT -Monitor QT daily  Paroxysmal Afib  Denies chest pain on admission. CHADS2VASC 1, off Eliquis  and metoprolol since June per shared decision making with cardiology. EKG: sinus tachy with  PVCs -Continuous cardiac monitoring   Hypertension Blood pressure on admission has been soft. BP on admission was 112/78 Patient takes amlodipine 5 mg daily for home medication.   -Hold amlodipine 5 mg   Depression  Anxiety  h/o benzodiazepine withdrawal seizures  Home medication includes Clonazepam 1g BID, Xanax 0.'5mg'$  daily, hydroxyzine '25mg'$  4 times a day  -Continue Clonazepam 1g BID , Xanax 0.'5mg'$  daily, hydroxyzine '25mg'$  4 times a day  -Consider holding seroquel if persistent electrolyte disturbances despite repletion  -Continue to wean the above medications as an outpatient -Monitor for signs of withdrawal  -Delirium precautions  Alcohol use disorder Patient has a history of alcohol use disorder she was recently hospitalized for alcohol withdrawal seizures. Reports last drink yesterday- drank 4oz of flavoured beer which was her first drink since March. Denies drinking ETOH today. -Start CIWA protocol, no ativan   Chronic hypoxemic respiratory failure Per chart review she was discharged on 2L on previous hospital admission and uses it PRN at night. She has also used lasix in the past but is no longer on this.  Pt uses oxygen at home but unsure how many litres. Sometimes she sleeps without out. Pt he has a history of smoking which is likley contributing. Bilateral wheezing on physical exam today. Current breathing comfortably on room air. -Monitor respiratory status  -PRN oxygen at night   GERD Home meds: pantoprazole 40 mg daily  -Hold pantoprazole as it can contribute to hypomagnesemia   Tobacco use disorder Smokes pack a day but over the last few days she has only smoked 2-3 in the last few days. She is requesting a nicotine patch -Nicotine patch '14mg'$  patch   Chronic pain Home meds: gabapentin '600mg'$  TID, tylenol '1000mg'$  Q6H -Continue gabapentin  '600mg'$  TID -Continue tylenol '1000mg'$  Q6H   Seasonal allergies Home medications include Flonase -Continue home Flonase   FEN/GI:  regular diet, Advance diet as tolerated, Normal saline 110 cc/hr  Prophylaxis: Lovenox   Disposition: med tele  History of Present Illness:  Karen Casey is a 65 y.o. female presenting with 3 days hx worsening nausea, vomiting (yellow in colour, not bloody) wretching and reduced appetite. She ate cheese yesterday at 5pm and then she had loose BM x 7 episodes. Denies BRBR. She was last felt like herself and well in March 22. Also has experienced some abdominal pain. Reports hx of appendicitis and diverticulitis Denies eating take out. Denies recent travel. Experienced some night sweats and feeling cold. She reports her taste has changed and everything tastes like salt. 18lb weight loss since March, has lost 3lb in 3 weeks. Reports RUQ and LUQ  pain for the last few months. Has taken all her meds today.  [place] forwards 1 month ago. Went to see PCP today and Dr Thompson Grayer was concerned about acute pancreatitis/diverticulitis so sent her to the ER.  Controlled substances  Klonipin: '1mg'$  BID. Has had one dose today and one dose in the ED. Taken her xanax today. Usually takes Xanax  0.'5mg'$  one/day as needed.  Covid vaccines x 2 and 2 boosters. Lives with spouse. Independent with ADLs.  Review Of Systems: Per HPI with the following additions:   Review of Systems  Constitutional:  Positive for chills and unexpected weight change. Negative for fever.  Respiratory:  Positive for cough and shortness of breath.   Cardiovascular:  Positive for palpitations. Negative for chest pain.  Gastrointestinal:  Positive for abdominal pain.  Genitourinary:  Negative for dysuria and frequency.  Neurological:  Positive for headaches.    Patient Active Problem List   Diagnosis Date Noted   Chronic obstructive pulmonary disease ([place]) 12/09/2020   Chronic pain of breast 11/12/2020   Compression fracture of body of thoracic vertebra ([place]) 11/06/2020   Drug withdrawal seizure with complication ([place]) [id number]   SIADH  (syndrome of inappropriate ADH production) ([place]) 10/12/2020   Other chest pain    Hypomagnesemia    Long term (current) use of anticoagulants    Protein-calorie malnutrition ([place]) 09/17/2020   Hyponatremia    Paroxysmal atrial fibrillation ([place])    Alcohol withdrawal ([place]) 09/10/2020   Pulmonary nodules/lesions, multiple 07/14/2020   Chronic left shoulder pain 06/22/2020   HTN (hypertension) 06/02/2018   Body mass index (BMI) 31.0-31.9, adult 03/29/2013   Back pain 02/14/2013   Anxiety disorder 06/12/2012   Alcohol use 05/28/2012   Tobacco abuse 02/17/2012   GERD (gastroesophageal reflux disease) 02/04/2012   Intractable nausea and vomiting 02/04/2012   Depression 02/04/2012    Past Medical History: 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   Seizures ([place])    xanax withdrawl- December 2013   Urinary incontinence 06/09/2012    Past Surgical History: Past Surgical History:  Procedure Laterality Date   APPENDECTOMY     CATARACT EXTRACTION  06/09/2012   Left eye   left shoulder dislocation  Sept 2011    Social History: Social History   Tobacco Use   Smoking status: Every Day    Packs/day: 1.00    Years: 43.00    Pack years: 43.00    Types: Cigarettes   Smokeless tobacco: Never  Vaping Use   Vaping Use: Never used  Substance Use Topics   Alcohol use: Yes    Alcohol/week: 3.0 standard drinks    Types: 3 Cans of beer per week    Comment: 2-3 times   Drug use: Yes    Types: Marijuana, Other-see comments    Comment: Past hx of benzo abuse--xanax    Please also refer to relevant sections of EMR.  Family History: Family History  Problem Relation Age of Onset   Hypertension Mother    Hyperlipidemia Mother    Aneurysm Mother        Rupture - Cause of death    Heart disease Father        MI - cause of death   Depression Father    Parkinsonism Father    Hypertension Sister    Colon cancer Neg Hx    Esophageal cancer Neg Hx    Rectal cancer Neg Hx    Stomach cancer Neg Hx      Allergies and Medications: No Known Allergies No current facility-administered medications on file prior to encounter.   Current Outpatient Medications on File Prior to Encounter  Medication Sig Dispense Refill   acetaminophen (TYLENOL) 500 MG tablet Take 1,000 mg by mouth every 6 (six) hours as needed for mild pain.     albuterol (VENTOLIN HFA) 108 (90 Base) MCG/ACT inhaler Inhale 2 puffs into the lungs every 6 (six) hours as needed for wheezing or shortness of breath. 8 g 2   amLODipine (NORVASC)  5 MG tablet Take 1 tablet (5 mg total) by mouth daily. 30 tablet 3   calcitonin, salmon, (MIACALCIN/FORTICAL) 200 UNIT/ACT nasal spray PLACE 1 SPRAY INTO ALTERNATE NOSTRILS DAILY. 2.7 mL 0   diclofenac Sodium (VOLTAREN) 1 % GEL Apply 2 g topically 4 (four) times daily. 50 g 1   feeding supplement (ENSURE ENLIVE / ENSURE PLUS) LIQD Take 237 mLs by mouth 2 (two) times daily between meals. (Patient not taking: No sig reported) 10000 mL 0   fluticasone (FLONASE) 50 MCG/ACT nasal spray SHAKE LIQUID AND USE 2 SPRAYS IN EACH NOSTRIL DAILY (Patient not taking: No sig reported) 16 g 0   gabapentin (NEURONTIN) 600 MG tablet Take 2 tablets (1,200 mg total) by mouth 3 (three) times daily. 180 tablet 0   Multiple Vitamins-Minerals (CENTRUM SILVER 50+WOMEN PO) Take 1 tablet by mouth daily.     pantoprazole (PROTONIX) 40 MG tablet Take 1 tablet (40 mg total) by mouth daily. 30 tablet 2   QUEtiapine (SEROQUEL) 50 MG tablet TAKE 1 TABLET BY MOUTH EVERYDAY AT BEDTIME 90 tablet 1   umeclidinium-vilanterol (ANORO ELLIPTA) 62.5-25 MCG/INH AEPB Inhale 1 puff into the lungs daily. 30 each 1    Objective: BP 132/68   Pulse 92   Temp 98.4 F (36.9 C) (Oral)   Resp (!) 21   SpO2 91%    Exam: General: Alert, laying in bed, Well appearing Eyes: Pupil reactive to right, No scleral icterus Neck: Supple, no palpable thyromegaly or lymphadenopathy  Cardiovascular: Tachycardic, No murmurs Respiratory: wheezing bilaterally, no crackles or rales, no increased WOB  Gastrointestinal: No abdominal distension, suprapubic tenderness on palpation  MSK: bilateral rib cage tenderness on palpation under the breast Derm: Dry skin, no jaundice  Psych: Oriented x4, anxious, normal affect, appropriate responses Extremities: No edema on extremities   Labs and Imaging: CBC BMET  Recent Labs  Lab 01/02/21 1021  WBC 8.4  HGB 16.5*  HCT 49.0*  PLT 221   Recent Labs  Lab 01/02/21 1021 01/02/21 1705  NA 132*  --   K 2.7* 2.8*  CL 95*  --   CO2 25  --   BUN 5*  --   CREATININE 0.88  --   GLUCOSE 118*  --   CALCIUM 8.0*  --      EKG: sinus tachy with PVCs  Alen Bleacher, MD 01/02/2021, 9:22 PM PGY-1, [place] Intern pager: [phone], text pages welcome   FPTS Upper-Level Resident Addendum   I have independently interviewed and examined the patient. I have discussed the above with the original author and agree with their documentation. My edits for correction/addition/clarification are in blue. Please see also any attending notes.   Lattie Haw MD PGY-2, [place] [facility] 01/03/2021 12:45 AM  FPTS Service pager: [phone] (text pages welcome through [facility])

## 2021-01-02 NOTE — ED Notes (Signed)
Pt ambulated to bathroom with assistance. Urine cup provided and expressed the need of a urine sample. Pt stated "I will try".

## 2021-01-02 NOTE — ED Notes (Signed)
Patient transported to CT 

## 2021-01-02 NOTE — ED Provider Notes (Signed)
Corresponded with Dr. Jeanell Sparrow, patient with vomiting and diarrhea and hypokalemia.  History of alcohol abuse.  We will also check magnesium level.  Currently getting IV potassium and is pending repeat potassium level.  Magnesium is come back very low at 0.8.  Repeat potassium after IV potassium has not changed.  Given severity of electrolyte disturbances, will need to be admitted.  She is still complaining of feeling jittery from withdrawal from benzodiazepines and she is given a dose of clonazepam.  Case is discussed with Dr. Posey Pronto of family practice teaching service, who agrees to admit the patient.  CRITICAL CARE Performed by: Delora Fuel Total critical care time: 40 minutes Critical care time was exclusive of separately billable procedures and treating other patients. Critical care was necessary to treat or prevent imminent or life-threatening deterioration. Critical care was time spent personally by me on the following activities: development of treatment plan with patient and/or surrogate as well as nursing, discussions with consultants, evaluation of patient's response to treatment, examination of patient, obtaining history from patient or surrogate, ordering and performing treatments and interventions, ordering and review of laboratory studies, ordering and review of radiographic studies, pulse oximetry and re-evaluation of patient's condition.  Results for orders placed or performed during the hospital encounter of [id number]  Basic metabolic panel  Result Value Ref Range   Sodium 132 (L) 135 - 145 mmol/L   Potassium 2.7 (LL) 3.5 - 5.1 mmol/L   Chloride 95 (L) 98 - 111 mmol/L   CO2 25 22 - 32 mmol/L   Glucose, Bld 118 (H) 70 - 99 mg/dL   BUN 5 (L) 8 - 23 mg/dL   Creatinine, Ser 0.88 0.44 - 1.00 mg/dL   Calcium 8.0 (L) 8.9 - 10.3 mg/dL   GFR, Estimated >60 >60 mL/min   Anion gap 12 5 - 15  CBC  Result Value Ref Range   WBC 8.4 4.0 - 10.5 K/uL   RBC 5.32 (H) 3.87 - 5.11 MIL/uL    Hemoglobin 16.5 (H) 12.0 - 15.0 g/dL   HCT 49.0 (H) 36.0 - 46.0 %   MCV 92.1 80.0 - 100.0 fL   MCH 31.0 26.0 - 34.0 pg   MCHC 33.7 30.0 - 36.0 g/dL   RDW 15.2 11.5 - 15.5 %   Platelets 221 150 - 400 K/uL   nRBC 0.0 0.0 - 0.2 %  Urinalysis, Routine w reflex microscopic Urine, Clean Catch  Result Value Ref Range   Color, Urine YELLOW YELLOW   APPearance HAZY (A) CLEAR   Specific Gravity, Urine 1.009 1.005 - 1.030   pH 7.0 5.0 - 8.0   Glucose, UA NEGATIVE NEGATIVE mg/dL   Hgb urine dipstick SMALL (A) NEGATIVE   Bilirubin Urine NEGATIVE NEGATIVE   Ketones, ur NEGATIVE NEGATIVE mg/dL   Protein, ur NEGATIVE NEGATIVE mg/dL   Nitrite NEGATIVE NEGATIVE   Leukocytes,Ua SMALL (A) NEGATIVE   RBC / HPF 0-5 0 - 5 RBC/hpf   WBC, UA 6-10 0 - 5 WBC/hpf   Bacteria, UA RARE (A) NONE SEEN   Squamous Epithelial / LPF 6-10 0 - 5  Hepatic function panel  Result Value Ref Range   Total Protein 6.2 (L) 6.5 - 8.1 g/dL   Albumin 3.3 (L) 3.5 - 5.0 g/dL   AST 34 15 - 41 U/L   ALT 20 0 - 44 U/L   Alkaline Phosphatase 91 38 - 126 U/L   Total Bilirubin 0.6 0.3 - 1.2 mg/dL   Bilirubin, Direct 0.2 0.0 -  0.2 mg/dL   Indirect Bilirubin 0.4 0.3 - 0.9 mg/dL  Lipase, blood  Result Value Ref Range   Lipase 28 11 - 51 U/L  Magnesium  Result Value Ref Range   Magnesium 0.8 (LL) 1.7 - 2.4 mg/dL  Potassium  Result Value Ref Range   Potassium 2.8 (L) 3.5 - 5.1 mmol/L   DG Chest 2 View  Result Date: 12/09/2020 CLINICAL DATA:  Short of breath EXAM: CHEST - 2 VIEW COMPARISON:  12/05/2020 FINDINGS: Heart size and vascularity normal. Lungs clear and well aerated. No infiltrate or effusion Midthoracic compression fractures unchanged from prior chest CT 10/12/2020. IMPRESSION: No active cardiopulmonary disease. Electronically Signed   By: Franchot Gallo M.D.   On: 12/09/2020 09:12   CT ABDOMEN PELVIS W CONTRAST  Result Date: 01/02/2021 CLINICAL DATA:  Acute generalized abdominal pain. EXAM: CT ABDOMEN AND PELVIS WITH  CONTRAST TECHNIQUE: Multidetector CT imaging of the abdomen and pelvis was performed using the standard protocol following bolus administration of intravenous contrast. CONTRAST:  110m OMNIPAQUE IOHEXOL 300 MG/ML  SOLN COMPARISON:  Oct 12, 2020. FINDINGS: Lower chest: No acute abnormality. Hepatobiliary: No gallstones or biliary dilatation is noted. Probable hepatic steatosis. Pancreas: Unremarkable. No pancreatic ductal dilatation or surrounding inflammatory changes. Spleen: Normal in size without focal abnormality. Adrenals/Urinary Tract: Adrenal glands are unremarkable. Kidneys are normal, without renal calculi, focal lesion, or hydronephrosis. Bladder is unremarkable. Stomach/Bowel: The stomach appears normal. There is no evidence of bowel obstruction or inflammation. Sigmoid diverticulosis is noted. Vascular/Lymphatic: Aortic atherosclerosis. No enlarged abdominal or pelvic lymph nodes. Reproductive: Uterus and bilateral adnexa are unremarkable. Other: No abdominal wall hernia or abnormality. No abdominopelvic ascites. Musculoskeletal: No acute or significant osseous findings. IMPRESSION: Probable hepatic steatosis. Sigmoid diverticulosis without inflammation. No acute abnormality seen in the abdomen or pelvis. Aortic Atherosclerosis (ICD10-I70.0). Electronically Signed   By: JMarijo ConceptionM.D.   On: 01/02/2021 15:57   DG Chest Portable 1 View  Result Date: 12/05/2020 CLINICAL DATA:  Chest pain and hypotension. Chest pain is left-sided and reportedly has been present since March. EXAM: PORTABLE CHEST 1 VIEW COMPARISON:  10/16/2020. FINDINGS: Cardiac silhouette is normal in size. Normal mediastinal and hilar contours. Clear lungs.  No pleural effusion or pneumothorax. Skeletal structures are grossly intact. IMPRESSION: No active disease. Electronically Signed   By: DLajean ManesM.D.   On: [id number]     GDelora Fuel MD [id number]

## 2021-01-02 NOTE — Patient Instructions (Signed)
It was wonderful to see you today.  Please bring ALL of your medications with you to every visit.   Today we talked about:  - I have refilled your Klonopin for the next month, and given you 15 of the as needed 0.5 mg Xanax - Continue to avoid alcohol - this is one the best things you can do for your health - You have scheduled follow up for mammogram, stress test for your heart - I am working on getting the CT of your lungs scheduled, we will call you once improved by insurance   Thank you for choosing [facility].   Please call [phone] with any questions about today's appointment.  Please be sure to schedule follow up at the front  desk before you leave today.   Karen Savannah, MD  Family Medicine

## 2021-01-02 NOTE — ED Provider Notes (Addendum)
[facility] EMERGENCY DEPARTMENT Provider Note   CSN: [id number] Arrival date & time: 01/02/21  [id number]     History Chief Complaint  Patient presents with   Weakness    Karen Casey is a 65 y.o. female.  HPI 65 year old female history of alcohol abuse, withdrawal seizures, on Xanax presents today from family practice center with complaints of nausea, vomiting, diarrhea, generalized weakness, tachycardia.  Patient has had some symptoms since March.  However over the past 3 days she has become nauseated and unable to tolerate food or liquid.  She does report that she has decreased drinking but drank Of a cooler yesterday to settle her stomach.  She has felt generally weak and her legs feel like they are giving out.  She has some pain in her abdomen.  She reports multiple episodes of vomiting.  She denies any blood or bile.  She has had ongoing loose stools.  She denies any blood or dark tarry stools.  She is on home oxygen reports that she had to turn it up all the way yesterday.  She reports that she has had some episodes of having low sodium.  She denies fever, chills, chest pain, increased coughing, worsening dyspnea, abnormal urination, pain with urination or frequency of urination, leg swelling, history of DVT or PE.    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   Seizures ([place])    xanax withdrawl- December 2013   Urinary incontinence 06/09/2012    Patient Active Problem List   Diagnosis Date Noted   Chronic obstructive pulmonary disease ([place]) 12/09/2020   Chronic pain of breast 11/12/2020   Compression fracture of body of thoracic vertebra ([place]) 11/06/2020   Drug withdrawal seizure with complication ([place]) [id number]   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   Other chest pain    Hypomagnesemia    Long term (current) use of anticoagulants    Protein-calorie malnutrition ([place]) 09/17/2020   Hyponatremia    Paroxysmal atrial fibrillation ([place])    Alcohol withdrawal ([place]) 09/10/2020   Pulmonary nodules/lesions, multiple 07/14/2020   Chronic left shoulder pain 06/22/2020   HTN (hypertension) 06/02/2018   Body mass index (BMI) 31.0-31.9, adult 03/29/2013   Back pain 02/14/2013   Anxiety disorder 06/12/2012   Alcohol use 05/28/2012   Tobacco abuse 02/17/2012   GERD (gastroesophageal reflux disease) 02/04/2012   Intractable nausea and vomiting 02/04/2012   Depression 02/04/2012    Past Surgical History:  Procedure Laterality Date   APPENDECTOMY     CATARACT EXTRACTION  06/09/2012   Left eye   left shoulder dislocation  Sept 2011     OB History   No obstetric history on file.     Family History  Problem Relation Age of Onset   Hypertension Mother    Hyperlipidemia Mother    Aneurysm Mother        Rupture - Cause of death   Heart disease Father        MI - cause of death   Depression Father    Parkinsonism Father    Hypertension Sister    Colon cancer Neg Hx    Esophageal cancer Neg Hx    Rectal cancer Neg Hx  Stomach cancer Neg Hx     Social History   Tobacco Use   Smoking status: Every Day    Packs/day: 1.00    Years: 43.00    Pack years: 43.00    Types: Cigarettes   Smokeless tobacco: Never  Vaping Use   Vaping Use: Never used  Substance Use Topics   Alcohol use: Yes    Alcohol/week: 3.0 standard drinks    Types: 3 Cans of beer per week    Comment: 2-3 times   Drug use: Yes    Types: Marijuana, Other-see comments    Comment: Past hx of benzo abuse--xanax    Home Medications Prior to Admission medications   Medication Sig Start Date End Date Taking? Authorizing Provider  acetaminophen (TYLENOL) 500 MG tablet Take 1,000 mg by mouth every 6 (six) hours as needed for mild  pain.    [provider]  albuterol (VENTOLIN HFA) 108 (90 Base) MCG/ACT inhaler Inhale 2 puffs into the lungs every 6 (six) hours as needed for wheezing or shortness of breath. 12/08/20   Cantwell, Celeste C, PA-C  ALPRAZolam (XANAX) 0.5 MG tablet TAKE 1 TABLET BY MOUTH DAILY AS NEEDED FOR ANXIETY. DO NOT FILL UNTIL 12/13/2020 12/24/20   [name], MD  amLODipine (NORVASC) 5 MG tablet Take 1 tablet (5 mg total) by mouth daily. 12/08/20 04/07/21  Cantwell, Celeste C, PA-C  calcitonin, salmon, (MIACALCIN/FORTICAL) 200 UNIT/ACT nasal spray PLACE 1 SPRAY INTO ALTERNATE NOSTRILS DAILY. 12/24/20 01/23/21  [name], MD  clonazePAM (KLONOPIN) 1 MG tablet Take 1 tablet (1 mg total) by mouth 2 (two) times daily for 19 days. DO NOT TAKE MORE Than 2 tabs daily. Do not fill until 12/13/2020. 12/09/20 12/28/20  [name], MD  diclofenac Sodium (VOLTAREN) 1 % GEL Apply 2 g topically 4 (four) times daily. 10/23/20   Wilber Oliphant, MD  feeding supplement (ENSURE ENLIVE / ENSURE PLUS) LIQD Take 237 mLs by mouth 2 (two) times daily between meals. Patient not taking: No sig reported 10/17/20   Lavina Hamman, MD  fluticasone (FLONASE) 50 MCG/ACT nasal spray SHAKE LIQUID AND USE 2 SPRAYS IN [facility] NOSTRIL DAILY Patient not taking: No sig reported 07/15/20   [name], MD  gabapentin (NEURONTIN) 600 MG tablet Take 2 tablets (1,200 mg total) by mouth 3 (three) times daily. 10/17/20   Lavina Hamman, MD  Multiple Vitamins-Minerals (CENTRUM SILVER 50+WOMEN PO) Take 1 tablet by mouth daily.    [provider]  pantoprazole (PROTONIX) 40 MG tablet Take 1 tablet (40 mg total) by mouth daily. 11/12/20   [name], MD  QUEtiapine (SEROQUEL) 50 MG tablet Take 1 tablet (50 mg total) by mouth at bedtime. 12/09/20   [name], MD  umeclidinium-vilanterol (ANORO ELLIPTA) 62.5-25 MCG/INH AEPB Inhale 1 puff into the lungs daily. 12/17/20 01/16/21  [name], MD    Allergies    Patient has  no known allergies.  Review of Systems   Review of Systems  All other systems reviewed and are negative.  Physical Exam Updated Vital Signs BP 112/78   Pulse (!) 114   Temp 98.4 F (36.9 C) (Oral)   Resp 16   SpO2 98%   Physical Exam Vitals and nursing note reviewed.  Constitutional:      Appearance: Normal appearance.  HENT:     Head: Normocephalic.     Right Ear: External ear normal.     Left Ear: External ear  normal.     Nose: Nose normal.     Mouth/Throat:     Mouth: Mucous membranes are dry.     Pharynx: Oropharynx is clear.  Eyes:     Pupils: Pupils are equal, round, and reactive to light.  Cardiovascular:     Rate and Rhythm: Regular rhythm. Tachycardia present.  Pulmonary:     Effort: Pulmonary effort is normal.     Breath sounds: Normal breath sounds.  Abdominal:     General: Abdomen is flat.     Palpations: Abdomen is soft.     Tenderness: There is abdominal tenderness.     Comments: Moderate tenderness palpation in epigastrium  Musculoskeletal:        General: Normal range of motion.     Cervical back: Normal range of motion.  Skin:    General: Skin is warm and dry.     Capillary Refill: Capillary refill takes less than 2 seconds.  Neurological:     General: No focal deficit present.     Mental Status: She is alert and oriented to person, place, and time.  Psychiatric:        Mood and Affect: Mood normal.        Behavior: Behavior normal.    ED Results / Procedures / Treatments   Labs (all labs ordered are listed, but only abnormal results are displayed) Labs Reviewed  BASIC METABOLIC PANEL - Abnormal; Notable for the following components:      Result Value   Sodium 132 (*)    Potassium 2.7 (*)    Chloride 95 (*)    Glucose, Bld 118 (*)    BUN 5 (*)    Calcium 8.0 (*)    All other components within normal limits  CBC - Abnormal; Notable for the following components:   RBC 5.32 (*)    Hemoglobin 16.5 (*)    HCT 49.0 (*)    All other  components within normal limits  URINALYSIS, ROUTINE W REFLEX MICROSCOPIC  HEPATIC FUNCTION PANEL  LIPASE, BLOOD  CBG MONITORING, ED    EKG EKG Interpretation  Date/Time:  Friday January 02 2021 10:12:41 EDT Ventricular Rate:  116 PR Interval:  118 QRS Duration: 68 QT Interval:  352 QTC Calculation: 489 R Axis:   -12 Text Interpretation: Sinus tachycardia with Premature atrial complexes Minimal voltage criteria for LVH, may be normal variant ( R in aVL ) Cannot rule out Anterior infarct , age undetermined Abnormal ECG Confirmed by Pattricia Boss [phone]) on 01/02/2021 12:55:53 PM  Radiology No results found.  Procedures Procedures   Medications Ordered in ED Medications  sodium chloride 0.9 % bolus 1,000 mL (has no administration in time range)  ondansetron (ZOFRAN) injection 4 mg (has no administration in time range)  LORazepam (ATIVAN) injection 1 mg (has no administration in time range)  potassium chloride 10 mEq in 100 mL IVPB (has no administration in time range)    ED Course  I have reviewed the triage vital signs and the nursing notes.  Pertinent labs & imaging results that were available during my care of the patient were reviewed by me and considered in my medical decision making (see chart for details).    MDM Rules/Calculators/A&P                           65 year old female history of alcohol abuse, now with nausea, vomiting, diarrhea presents today with above complaints and significant hypokalemia noted  on labs.  Her potassium was 2.7.  She has been tachycardic.  IV fluids are ordered.  IV potassium was ordered.  She is also given a dose of IV Ativan as she has a history of alcohol abuse and also has been unable to keep down her Xanax. 4:17 PM Patient feels improved.  Potassium is infusing.  Plan oral fluid trial. We will recheck her potassium.  If this is improved and she is tolerating liquids, she may be discharged home. Discussed with Dr. Roxanne Mins who has assumed  care and will disposition patient as appropriate Final Clinical Impression(s) / ED Diagnoses Final diagnoses:  Non-intractable vomiting with nausea, unspecified vomiting type  Weakness  Hypokalemia    Rx / DC Orders ED Discharge Orders     None        Pattricia Boss, MD 01/02/21 1324    Pattricia Boss, MD 01/02/21 1620

## 2021-01-03 ENCOUNTER — Encounter (HOSPITAL_COMMUNITY): Payer: Self-pay | Admitting: Family Medicine

## 2021-01-03 DIAGNOSIS — R5383 Other fatigue: Secondary | ICD-10-CM | POA: Insufficient documentation

## 2021-01-03 DIAGNOSIS — R112 Nausea with vomiting, unspecified: Secondary | ICD-10-CM | POA: Diagnosis not present

## 2021-01-03 DIAGNOSIS — R531 Weakness: Secondary | ICD-10-CM | POA: Insufficient documentation

## 2021-01-03 DIAGNOSIS — F13230 Sedative, hypnotic or anxiolytic dependence with withdrawal, uncomplicated: Secondary | ICD-10-CM | POA: Diagnosis not present

## 2021-01-03 DIAGNOSIS — R197 Diarrhea, unspecified: Secondary | ICD-10-CM

## 2021-01-03 DIAGNOSIS — R1013 Epigastric pain: Secondary | ICD-10-CM

## 2021-01-03 DIAGNOSIS — E876 Hypokalemia: Secondary | ICD-10-CM | POA: Diagnosis not present

## 2021-01-03 DIAGNOSIS — R1110 Vomiting, unspecified: Secondary | ICD-10-CM

## 2021-01-03 LAB — BASIC METABOLIC PANEL
Anion gap: 8 (ref 5–15)
Anion gap: 5 (ref 5–15)
Anion gap: 7 (ref 5–15)
BUN: 5 mg/dL — ABNORMAL LOW (ref 8–23)
BUN: <5 mg/dL — ABNORMAL LOW (ref 8–23)
BUN: 5 mg/dL — ABNORMAL LOW (ref 8–23)
CO2: 25 mmol/L (ref 22–32)
CO2: 25 mmol/L (ref 22–32)
CO2: 26 mmol/L (ref 22–32)
Calcium: 7.6 mg/dL — ABNORMAL LOW (ref 8.9–10.3)
Calcium: 7.5 mg/dL — ABNORMAL LOW (ref 8.9–10.3)
Calcium: 8.3 mg/dL — ABNORMAL LOW (ref 8.9–10.3)
Chloride: 102 mmol/L (ref 98–111)
Chloride: 106 mmol/L (ref 98–111)
Chloride: 101 mmol/L (ref 98–111)
Creatinine, Ser: 0.69 mg/dL (ref 0.44–1.00)
Creatinine, Ser: 0.58 mg/dL (ref 0.44–1.00)
Creatinine, Ser: 0.83 mg/dL (ref 0.44–1.00)
GFR, Estimated: >60 mL/min (ref >60)
GFR, Estimated: >60 mL/min (ref >60)
GFR, Estimated: >60 mL/min (ref >60)
Glucose, Bld: 84 mg/dL (ref 70–99)
Glucose, Bld: 88 mg/dL (ref 70–99)
Glucose, Bld: 116 mg/dL — ABNORMAL HIGH (ref 70–99)
Potassium: 3.7 mmol/L (ref 3.5–5.1)
Potassium: 3.6 mmol/L (ref 3.5–5.1)
Potassium: 3.8 mmol/L (ref 3.5–5.1)
Sodium: 135 mmol/L (ref 135–145)
Sodium: 136 mmol/L (ref 135–145)
Sodium: 134 mmol/L — ABNORMAL LOW (ref 135–145)

## 2021-01-03 LAB — CBC
HCT: 41.4 % (ref 36.0–46.0)
Hemoglobin: 14 g/dL (ref 12.0–15.0)
MCH: 31.3 pg (ref 26.0–34.0)
MCHC: 33.8 g/dL (ref 30.0–36.0)
MCV: 92.6 fL (ref 80.0–100.0)
Platelets: 150 10*3/uL (ref 150–400)
RBC: 4.47 MIL/uL (ref 3.87–5.11)
RDW: 15.4 % (ref 11.5–15.5)
WBC: 8.7 10*3/uL (ref 4.0–10.5)
nRBC: 0 % (ref 0.0–0.2)

## 2021-01-03 LAB — MAGNESIUM
Magnesium: 1.9 mg/dL (ref 1.7–2.4)
Magnesium: 1.8 mg/dL (ref 1.7–2.4)
Magnesium: 2 mg/dL (ref 1.7–2.4)

## 2021-01-03 LAB — PHOSPHORUS: Phosphorus: 4.2 mg/dL (ref 2.5–4.6)

## 2021-01-03 LAB — SARS CORONAVIRUS 2 (TAT 6-24 HRS): SARS Coronavirus 2: NEGATIVE

## 2021-01-03 LAB — ALBUMIN: Albumin: 2.8 g/dL — ABNORMAL LOW (ref 3.5–5.0)

## 2021-01-03 MED ORDER — MAGNESIUM SULFATE 2 GM/50ML IV SOLN
2.0000 g | Freq: Once | INTRAVENOUS | Status: AC
  Administered 2021-01-03: 2 g via INTRAVENOUS
  Filled 2021-01-03: qty 50

## 2021-01-03 MED ORDER — PANTOPRAZOLE SODIUM 40 MG PO TBEC
40.0000 mg | DELAYED_RELEASE_TABLET | Freq: Every day | ORAL | Status: DC
  Administered 2021-01-03: 40 mg via ORAL
  Filled 2021-01-03: qty 1

## 2021-01-03 MED ORDER — ALUM & MAG HYDROXIDE-SIMETH 200-200-20 MG/5ML PO SUSP
30.0000 mL | Freq: Once | ORAL | Status: AC | PRN
  Administered 2021-01-03: 30 mL via ORAL
  Filled 2021-01-03: qty 30

## 2021-01-03 MED ORDER — CALCIUM GLUCONATE-NACL 2-0.675 GM/100ML-% IV SOLN
2.0000 g | Freq: Once | INTRAVENOUS | Status: AC
  Administered 2021-01-03: 2000 mg via INTRAVENOUS
  Filled 2021-01-03: qty 100

## 2021-01-03 MED ORDER — NICOTINE 14 MG/24HR TD PT24
14.0000 mg | MEDICATED_PATCH | Freq: Every day | TRANSDERMAL | Status: DC
  Administered 2021-01-03 – 2021-01-05 (×3): 14 mg via TRANSDERMAL
  Filled 2021-01-03 (×3): qty 1

## 2021-01-03 NOTE — Discharge Summary (Addendum)
[facility] Discharge Summary  Patient name: Karen Casey Medical record number: [id number] Date of birth: Jun 29, 1955 Age: 65 y.o. Gender: female Date of Admission: 01/02/2021  Date of Discharge: 01/05/21 Admitting Physician: Leeanne Rio, MD  Primary Care Provider: [name], MD Consultants: GI  Indication for Hospitalization: Nausea and vomiting with subsequent hypokalemia and hypomagnesia  Discharge Diagnoses/Problem List:  Hypokalemia  Hypomagnesemia  Hyponatremia Paroxysmal atrial fibrillation  SIADH Alcohol use disorder  Disposition: home  Discharge Condition: medically stable for discharge   Discharge Exam:  Blood pressure (!) 141/92, pulse (!) 105, temperature 98 F (36.7 C), temperature source Oral, resp. rate 20, height '5\' 6"'$  (1.676 m), weight 87.2 kg, SpO2 93 %.  General: Alert, no acute distress,  Cardio: Normal S1 and S2, RRR, no r/m/g Pulm: mild wheeze otherwise clear  Abdomen: Bowel sounds normal. Abdomen soft, mildly tender RUQ  Extremities: No peripheral edema. Neuro: Cranial nerves grossly intact    Brief Hospital Course:    Karen Casey is a 65 y.o. female presenting with vomiting and diarrhea . PMH is significant for anxiety, depression, seizure disorder, GERD, HTN, ETOH abuse and PAF.  Hypokalemia and hypomagnesemia  On admission K 2.7, Mg 0.8.  Repleted with 4 g magnesium sulfate and 54mq potassium. EKG: sinus tachycardia with PVCs despite this potassium continue to be low the patient was admitted for electrolyte repletion.  Most likely cause of electrolyte abnormalities was vomiting, diarrhea and recent severe malnutrition.  The following medications were held: protonix and Calcitonin. They were likely contributing to acute electrolyte disturbances  On discharge patient's Mg 1.2 and K 3.9.  Vomiting and diarrhea Patient had a 3-day history of nausea, vomiting and 1 day history of diarrhea after  eating cheese at home.  Symptoms improved in hospital.  CT abdomen pelvis negative for acute intra-abdominal pathology, showed hepatic steatosis and diverticulosis.  At the time of discharge patient's diarrhea and vomiting had resolved.  Depression  anxiety Patient's usual clonazepam and Xanax were continued in hospital at the same dose.  Patient should continue weaning as an outpatient with PCP   Discharge recommendations: Discharged on daily PO magnesium supplementation. PCP to recheck level at hospital follow up.  Hypokalemia, hypomagnesemia on admission. Recommend PCP to recheck.   Significant Procedures: EGD with biopsy 01/04/21  Significant Labs and Imaging:  Recent Labs  Lab 01/02/21 1021 01/03/21 0134  WBC 8.4 8.7  HGB 16.5* 14.0  HCT 49.0* 41.4  PLT 221 150   Recent Labs  Lab 01/02/21 1021 01/02/21 1328 01/02/21 1705 01/03/21 0134 01/03/21 0604 01/03/21 1504 01/05/21 0932  NA 132*  --   --  135 136 134* 140  K 2.7*  --  2.8* 3.7 3.6 3.8 3.9  CL 95*  --   --  102 106 101 100  CO2 25  --   --  '25 25 26 '$ 33*  GLUCOSE 118*  --   --  84 88 116* 92  BUN 5*  --   --  5* <5* 5* <5*  CREATININE 0.88  --   --  0.69 0.58 0.83 0.88  CALCIUM 8.0*  --   --  7.6* 7.5* 8.3* 8.8*  MG  --   --  0.8* 1.9 1.8 2.0 1.2*  PHOS  --   --   --   --  4.2  --  3.3  ALKPHOS  --  91  --   --   --   --  88  AST  --  34  --   --   --   --  31  ALT  --  20  --   --   --   --  17  ALBUMIN  --  3.3*  --   --   --  2.8* 2.9*    CT ABDOMEN AND PELVIS WITH CONTRAST 01/02/21 IMPRESSION: Probable hepatic steatosis. Sigmoid diverticulosis without inflammation. No acute abnormality seen in the abdomen or pelvis. Aortic Atherosclerosis (ICD10-I70.0).  ULTRASOUND ABDOMEN LIMITED RIGHT UPPER QUADRANT 01/04/21 IMPRESSION: Increased liver echogenicity is consistent with hepatic steatosis.  UPPER GI ENDOSCOPY 01/04/21 - Esophagogastric landmarks identified. - 1 cm hiatal hernia. - Gastroesophageal  flap valve classified as Hill Grade III (minimal fold, loose to endoscope, hiatal hernia likely). - Single diminutive gastric erosion. - Normal stomach otherwise - biopsies taken to rule out H pylori - Normal duodenal bulb and second portion of the duodenum. Biopsied.  SURGICAL PATHOLOGY 01/04/21 FINAL MICROSCOPIC DIAGNOSIS:  A. DUODENUM, BIOPSY:  - Benign small bowel mucosa.  - No villous blunting or increase in intraepithelial lymphocytes.  - No dysplasia or malignancy.  B. STOMACH, ANTRUM AND BODY, BIOPSY:  - Mild reactive gastropathy.  - Warthin-Starry is negative for Helicobacter pylori.  - No intestinal metaplasia, dysplasia, or malignancy.   Results/Tests Pending at Time of Discharge: None   Discharge Medications:  Allergies as of 01/05/2021   Not on File      Medication List     STOP taking these medications    calcitonin (salmon) 200 UNIT/ACT nasal spray Commonly known as: MIACALCIN/FORTICAL       TAKE these medications    acetaminophen 500 MG tablet Commonly known as: TYLENOL Take 1,000 mg by mouth every 6 (six) hours as needed for mild pain.   albuterol 108 (90 Base) MCG/ACT inhaler Commonly known as: VENTOLIN HFA Inhale 2 puffs into the lungs every 6 (six) hours as needed for wheezing or shortness of breath.   ALPRAZolam 0.5 MG tablet Commonly known as: XANAX TAKE 1 TABLET BY MOUTH DAILY AS NEEDED FOR ANXIETY. DO NOT FILL UNTIL 12/13/2020   amLODipine 5 MG tablet Commonly known as: NORVASC Take 1 tablet (5 mg total) by mouth daily.   CENTRUM SILVER 50+WOMEN PO Take 1 tablet by mouth daily.   clonazePAM 1 MG tablet Commonly known as: KLONOPIN Take 1 tablet (1 mg total) by mouth 2 (two) times daily for 28 days. DO NOT TAKE MORE Than 2 tabs daily. Do not fill until 12/13/2020.   feeding supplement Liqd Take 237 mLs by mouth 2 (two) times daily between meals.   gabapentin 600 MG tablet Commonly known as: NEURONTIN Take 1 tablet (600 mg total) by  mouth 3 (three) times daily. What changed: how much to take   guaiFENesin 600 MG 12 hr tablet Commonly known as: MUCINEX Take 1 tablet (600 mg total) by mouth 2 (two) times daily as needed for cough or to loosen phlegm.   hydrOXYzine 25 MG capsule Commonly known as: VISTARIL Take 25 mg by mouth 4 (four) times daily.   ondansetron 8 MG disintegrating tablet Commonly known as: Zofran ODT Take 1 tablet (8 mg total) by mouth every 8 (eight) hours as needed for nausea or vomiting.   pantoprazole 40 MG tablet Commonly known as: PROTONIX Take 1 tablet (40 mg total) by mouth daily.   potassium chloride SA 20 MEQ tablet Commonly known as: KLOR-CON Take 1 tablet (20 mEq total) by mouth 2 (  two) times daily.   QUEtiapine 50 MG tablet Commonly known as: SEROQUEL TAKE 1 TABLET BY MOUTH EVERYDAY AT BEDTIME What changed: See the new instructions.   thiamine 100 MG tablet Take 1 tablet (100 mg total) by mouth daily.   umeclidinium-vilanterol 62.5-25 MCG/INH Aepb Commonly known as: ANORO ELLIPTA Inhale 1 puff into the lungs daily.        Discharge Instructions: Please refer to Patient Instructions section of EMR for full details.  Patient was counseled important signs and symptoms that should prompt return to medical care, changes in medications, dietary instructions, activity restrictions, and follow up appointments.   Follow-Up Appointments:  Follow-up Information     Pray, Norwood Levo, MD. Go on 01/14/2021.   Specialty: Family Medicine Why: At 8:30am. Please arrive by 8:15am. This is your hospital follow up appointment with your primary care doctor, Dr. Thompson Grayer. If this day and time does not work for you, please call the office directly to change the appointment. Contact information: [place] [address] [phone]         [place]. Go on 01/08/2021.   Specialty: Family Medicine Why: 8:30am - lab appointment to recheck blood  electrolytes 9am - medication follow up with our clinical pharmacist, Dr. Rolm Bookbinder information: [address] [id number] [place]                Alen Bleacher, MD 01/07/2021, 8:39 AM PGY-3, [facility]   Upper Level Addendum: I have seen and evaluated this patient along with Dr. Adah Salvage and reviewed the above note, making necessary revisions as appropriate. These are denoted by green text. I agree with the medical decision making and physical exam as noted above. Ezequiel Essex, MD PGY-2 [facility] Family Medicine Residency

## 2021-01-03 NOTE — Progress Notes (Signed)
NEW ADMISSION NOTE New Admission Note:   Arrival Method: Stetcher Mental Orientation: A&O x4 Telemetry: [id number] Assessment: Completed Skin: No pressure ulcers noted. Pt has scabs on bilateral knees from previous fall. IV: RAC Pain: 0/10 Tubes: none present  Safety Measures: Safety Fall Prevention Plan has been given, discussed and signed.  Admission: Completed  [facility] Orientation: Patient has been orientated to the room, unit and staff.  Family: none present   Orders have been reviewed and implemented. Will continue to monitor the patient. Call light has been placed within reach and bed alarm has been activated.

## 2021-01-03 NOTE — Hospital Course (Addendum)
  Karen Casey is a 65 y.o. female presenting with vomiting and diarrhea . PMH is significant for anxiety, depression, seizure disorder, GERD, HTN, ETOH abuse and PAF.  Hypokalemia and hypomagnesemia  On admission K 2.7, Mg 0.8.  Repleted with 4 g magnesium sulfate and 59mq potassium. EKG: sinus tachycardia with PVCs despite this potassium continue to be low the patient was admitted for electrolyte repletion.  Most likely cause of electrolyte abnormalities was vomiting, diarrhea and recent severe malnutrition.  The following medications were held: protonix and Calcitonin. They were likely contributing to acute electrolyte disturbances  On discharge patient's Mg 1.2 and K 3.9.  Vomiting and diarrhea Patient had a 3-day history of nausea, vomiting and 1 day history of diarrhea after eating cheese at home.  Symptoms improved in hospital.  CT abdomen pelvis negative for acute intra-abdominal pathology, showed hepatic steatosis and diverticulosis.  At the time of discharge patient's diarrhea and vomiting had resolved.  Depression  anxiety Patient's usual clonazepam and Xanax were continued in hospital at the same dose.  Patient should continue weaning as an outpatient with PCP   Discharge recommendations: Discharged on daily PO magnesium supplementation. PCP to recheck level at hospital follow up.  Hypokalemia, hypomagnesemia on admission. Recommend PCP to recheck.

## 2021-01-03 NOTE — Progress Notes (Signed)
Pt skin assessment completed with [name],RN. No pressure ulcers noted on bony prominences. Pt had scabs on bilateral knees which she states were from previous falls. Will continue to monitor.

## 2021-01-03 NOTE — Consult Note (Signed)
Referring Provider: Dr. Erling Cruz  Primary Care Physician:  [name], MD Primary Gastroenterologist:  Dr. Wilfrid Lund   Reason for Consultation:  N/V/D and weight loss  HPI: Karen Casey is a 65 y.o. 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   She presented to [facility] ED on 01/02/2021 for further evaluation regarding nausea, vomiting, diarrhea x 3 days, intermittent RUQ and LUQ pain, generalized weakness with associated weight loss.  Labs in the ED showed a sodium level 132.  Potassium 2.7.  Magnesium 0.8.  Glucose 118.  Creatinine 0.8.  BUN 5.  Alk phos 91.  AST 34.  ALT 20.  Total bili 0.6.  Lipase 28.  WBC 8.4.  Hemoglobin 16.5 (baseline Hg 14.4 on 12/05/2020). Hematocrit 49.0.  Platelet 221.  SARS coronavirus 2 negative.  Urine with small quantity of leukocytes and urinary WBC 6-10.  CTAP with contrast showed hepatic steatosis, no acute intra abdominal or pelvic inflammatory or infectious process.  She received IV potassium and magnesium replacement, antiemetics and IV fluids.  PPI was held.  She was admitted to the telemetry unit.  A GI consult was requested due to the patient having N/V/D which resulted in significant electrolyte derangement.  She reports having episodes of nausea and vomiting which typically last for about 3 days every 6 to 8 weeks for many years.  She has constant nausea which worsens then she gags and dry heaves and intermittently spontaneously vomits partially digested food or ileus type fluid.  She frequently induces vomiting during these episodes to relieve the constant dry heaves cycle.  Approximately 6 months ago she vomited and saw a small amount of bright red blood in emesis on 2-3 occasions  without recurrence.  She has constant epigastric pain and during the episodes of N/V she has worse upper as well as lower abdominal pain.  She reports having dark brown syrup like diarrhea which occurs 2-4 times daily shortly after she eats any food for the past 4 months.  No specific food triggers.  No bloody diarrhea.  Today's RN has not witnessed any bowel movements as the patient has independent bathroom privileges.  She reports losing 16 pounds since March 2022.  No fever, sweats or chills.  No recent antibiotics.  No NSAID use.  Her nutritional intake is poor.  History of alcohol use disorder.  She previously drank 6 beers daily for 10 years and quit drinking in March 2022.  She drank one half of a wine cooler 1 week ago.  She infrequently smokes marijuana.  She reports taking Nexium for 2 to 3 years then switched to Protonix 40 mg daily March 2022.  She underwent an EGD 10/2012 which showed multiple erosions at the EG junction consistent with esophagitis.  A colonoscopy was done 11/2012 which was normal.  No known family history of esophageal, gastric or colon cancer.  CTAP with contrast 01/02/2021: Probable hepatic steatosis.  No gallstones or biliary dilatation. Pancreas was unremarkable, no pancreatic ductal dilatation or surrounding inflammatory changes. Sigmoid diverticulosis without inflammation.  No acute abnormality seen in the abdomen or pelvis.  Aortic Atherosclerosis  Echo 09/10/2020: 1. Left ventricular ejection fraction, by estimation, is 60 to 65%. The left ventricle has normal function. The left ventricle has no regional wall motion abnormalities.  There is mild left ventricular hypertrophy. Left ventricular diastolic parameters were normal. 2. Right ventricular systolic function is normal. The right ventricular size is normal. There is normal pulmonary artery systolic pressure. 3. The mitral valve is normal in structure. Trivial mitral valve regurgitation. No evidence of mitral  stenosis. 4. The aortic valve is tricuspid. Aortic valve regurgitation is not visualized. No aortic stenosis is present. 5. The inferior vena cava is normal in size with greater than 50% respiratory variability, suggesting right atrial pressure of 3 mmHg.  EGD 10/26/2012 Dr. Deatra Ina: Multiple erosions at the GE junction, esophagitis Biopsies consistent with GERD.  Colonoscopy 11/28/2012 by Dr. Deatra Ina: Normal colonoscopy, no polyps Recall colonoscopy 10 years - UNREMARKABLE COLONIC MUCOSA. - NO MICROSCOPIC COLITIS, ACTIVE INFLAMMATION OR GRANULOMAS.  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 Authorizing Provider  acetaminophen (TYLENOL) 500 MG tablet Take 1,000 mg by mouth every 6 (six) hours as needed for mild pain.   Yes [provider]  albuterol (VENTOLIN HFA) 108 (90 Base) MCG/ACT inhaler Inhale 2 puffs into the lungs every 6 (six) hours as needed for wheezing or shortness of breath. 12/08/20  Yes Cantwell, Celeste C, PA-C  ALPRAZolam (XANAX) 0.5 MG tablet TAKE 1 TABLET BY MOUTH DAILY AS NEEDED FOR ANXIETY. DO NOT FILL UNTIL 12/13/2020 Patient taking differently: Take 0.5 mg by mouth daily as needed for anxiety. 01/02/21  Yes Pray, Norwood Levo, MD  amLODipine (NORVASC) 5 MG tablet Take 1 tablet (5 mg total) by mouth daily. 12/08/20 04/07/21 Yes Cantwell, Celeste C, PA-C  calcitonin, salmon, (MIACALCIN/FORTICAL) 200 UNIT/ACT nasal spray PLACE 1 SPRAY INTO  ALTERNATE NOSTRILS DAILY. 12/24/20 01/23/21 Yes Pray, Norwood Levo, MD  clonazePAM (KLONOPIN) 1 MG tablet Take 1 tablet (1 mg total) by mouth 2 (two) times daily for 28 days. DO NOT TAKE MORE Than 2 tabs daily. Do not fill until 12/13/2020. 01/02/21 01/30/21 Yes Pray, Norwood Levo, MD  gabapentin (NEURONTIN) 600 MG tablet Take 2 tablets (1,200 mg total) by mouth 3 (three) times daily. 10/17/20  Yes Lavina Hamman, MD  hydrOXYzine (VISTARIL) 25 MG capsule Take 25 mg by mouth 4 (four) times daily. 12/06/20  Yes [provider]  Multiple Vitamins-Minerals (CENTRUM SILVER 50+WOMEN PO) Take 1 tablet by mouth daily.   Yes [provider]  ondansetron (ZOFRAN ODT) 8 MG disintegrating tablet Take 1 tablet (8 mg total) by mouth every 8 (eight) hours as needed for nausea or vomiting. 01/02/21  Yes Pattricia Boss, MD  pantoprazole (PROTONIX) 40 MG tablet Take 1 tablet (40 mg total) by mouth daily. 11/12/20  Yes Pray, Norwood Levo, MD  potassium chloride SA (KLOR-CON) 20 MEQ tablet Take 1 tablet (20 mEq total) by mouth 2 (two) times daily. 01/02/21  Yes Pattricia Boss, MD  QUEtiapine (SEROQUEL) 50 MG tablet TAKE 1 TABLET BY MOUTH EVERYDAY AT BEDTIME Patient taking  differently: Take 50 mg by mouth at bedtime. 01/02/21  Yes Pray, Norwood Levo, MD  umeclidinium-vilanterol (ANORO ELLIPTA) 62.5-25 MCG/INH AEPB Inhale 1 puff into the lungs daily. 12/17/20 01/16/21 Yes Pray, Norwood Levo, MD    Current Facility-Administered Medications  Medication Dose Route Frequency Provider Last Rate Last Admin   acetaminophen (TYLENOL) tablet 1,000 mg  1,000 mg Oral Q6H PRN Lattie Haw, MD       albuterol (PROVENTIL) (2.5 MG/3ML) 0.083% nebulizer solution 3 mL  3 mL Inhalation Q6H PRN Lattie Haw, MD       ALPRAZolam Duanne Moron) tablet 0.5 mg  0.5 mg Oral Daily PRN Lattie Haw, MD   0.5 mg at 01/03/21 1315   clonazePAM (KLONOPIN) tablet 1 mg  1 mg Oral BID Lattie Haw, MD   1 mg at 01/03/21 0092   gabapentin (NEURONTIN) tablet 1,200 mg   1,200 mg Oral TID Lattie Haw, MD   1,200 mg at 01/03/21 3300   hydrOXYzine (ATARAX/VISTARIL) tablet 25 mg  25 mg Oral QID Lattie Haw, MD   25 mg at 01/03/21 7622   nicotine (NICODERM CQ - dosed in mg/24 hours) patch 14 mg  14 mg Transdermal Daily Alen Bleacher, MD   14 mg at 01/03/21 6333   pantoprazole (PROTONIX) EC tablet 40 mg  40 mg Oral Daily Ezequiel Essex, MD   40 mg at 01/03/21 1315   prochlorperazine (COMPAZINE) injection 10 mg  10 mg Intravenous Q4H PRN Lattie Haw, MD       QUEtiapine (SEROQUEL) tablet 50 mg  50 mg Oral QHS Lattie Haw, MD   50 mg at 01/02/21 2306   umeclidinium-vilanterol (ANORO ELLIPTA) 62.5-25 MCG/INH 1 puff  1 puff Inhalation Daily Lattie Haw, MD   1 puff at 01/03/21 0725    Allergies as of 01/02/2021   (No Known Allergies)    Family History  Problem Relation Age of Onset   Hypertension Mother    Hyperlipidemia Mother    Aneurysm Mother        Rupture - Cause of death   Heart disease Father        MI - cause of death   Depression Father    Parkinsonism Father    Hypertension Sister    Colon cancer Neg Hx    Esophageal cancer Neg Hx    Rectal cancer Neg Hx    Stomach cancer Neg Hx     Social History   Socioeconomic History   Marital status: Single    Spouse name: Not on file   Number of children: Not on file   Years of education: Not on file   Highest education level: Not on file  Occupational History   Not on file  Tobacco Use   Smoking status: Every Day    Packs/day: 1.00    Years: 43.00    Pack years: 43.00    Types: Cigarettes   Smokeless tobacco: Never  Vaping Use   Vaping Use: Never used  Substance and Sexual Activity   Alcohol use: Yes    Alcohol/week: 3.0 standard drinks    Types: 3 Cans of beer per week    Comment: 2-3 times   Drug use: Yes    Types: Marijuana, Other-see comments    Comment: Past hx of benzo abuse--xanax   Sexual activity: Not on file  Other Topics Concern   Not on file  Social History  Narrative   Not on file   Social Determinants of Health   Financial Resource  Strain: Not on file  Food Insecurity: Not on file  Transportation Needs: Not on file  Physical Activity: Not on file  Stress: Not on file  Social Connections: Not on file  Intimate Partner Violence: Not on file    Review of Systems: See HPI, all other systems reviewed and are negative   Physical Exam: Vital signs in last 24 hours: Temp:  [97.7 F (36.5 C)-98.2 F (36.8 C)] 98.2 F (36.8 C) (07/23 0945) Pulse Rate:  [84-103] 92 (07/23 0945) Resp:  [11-28] 18 (07/23 0945) BP: (98-142)/(64-96) 114/70 (07/23 0945) SpO2:  [89 %-99 %] 91 % (07/23 0945) Weight:  [83.4 kg] 83.4 kg (07/23 0109)   General:  Alert 65 year old female in no acute distress. Head:  Normocephalic and atraumatic. Eyes:  No scleral icterus. Conjunctiva pink. Ears:  Normal auditory acuity. Nose:  No deformity, discharge or lesions. Mouth: Very poor dentition, chipped front lower tooth.  No ulcers or lesions.  Neck:  Supple. No lymphadenopathy or thyromegaly.  Lungs: Breath sounds clear throughout. Heart: Regular rate and rhythm, no murmurs. Abdomen: Soft, mild epigastric tenderness without rebound or guarding.  Positive bowel sounds all 4 quadrants. Rectal: Deferred. Musculoskeletal:  Symmetrical without gross deformities.  Pulses:  Normal pulses noted. Extremities:  Without clubbing or edema. Neurologic:  Alert and  oriented x4. No focal deficits.  Skin:  Intact without significant lesions or rashes. Psych:  Alert and cooperative. Normal mood and affect.  Intake/Output from previous day: 07/22 0701 - 07/23 0700 In: 2071.1 [P.O.:480; IV Piggyback:1591.1] Out: 700 [Urine:700] Intake/Output this shift: Total I/O In: 240 [P.O.:240] Out: -   Lab Results: Recent Labs    01/02/21 1021 01/03/21 0134  WBC 8.4 8.7  HGB 16.5* 14.0  HCT 49.0* 41.4  PLT 221 150   BMET Recent Labs    01/02/21 1021 01/02/21 1705  01/03/21 0134 01/03/21 0604  NA 132*  --  135 136  K 2.7* 2.8* 3.7 3.6  CL 95*  --  102 106  CO2 25  --  25 25  GLUCOSE 118*  --  84 88  BUN 5*  --  5* <5*  CREATININE 0.88  --  0.69 0.58  CALCIUM 8.0*  --  7.6* 7.5*   LFT Recent Labs    01/02/21 1328  PROT 6.2*  ALBUMIN 3.3*  AST 34  ALT 20  ALKPHOS 91  BILITOT 0.6  BILIDIR 0.2  IBILI 0.4   PT/INR No results for input(s): LABPROT, INR in the last 72 hours. Hepatitis Panel No results for input(s): HEPBSAG, HCVAB, HEPAIGM, HEPBIGM in the last 72 hours.    Studies/Results: CT ABDOMEN PELVIS W CONTRAST  Result Date: 01/02/2021 CLINICAL DATA:  Acute generalized abdominal pain. EXAM: CT ABDOMEN AND PELVIS WITH CONTRAST TECHNIQUE: Multidetector CT imaging of the abdomen and pelvis was performed using the standard protocol following bolus administration of intravenous contrast. CONTRAST:  172m OMNIPAQUE IOHEXOL 300 MG/ML  SOLN COMPARISON:  Oct 12, 2020. FINDINGS: Lower chest: No acute abnormality. Hepatobiliary: No gallstones or biliary dilatation is noted. Probable hepatic steatosis. Pancreas: Unremarkable. No pancreatic ductal dilatation or surrounding inflammatory changes. Spleen: Normal in size without focal abnormality. Adrenals/Urinary Tract: Adrenal glands are unremarkable. Kidneys are normal, without renal calculi, focal lesion, or hydronephrosis. Bladder is unremarkable. Stomach/Bowel: The stomach appears normal. There is no evidence of bowel obstruction or inflammation. Sigmoid diverticulosis is noted. Vascular/Lymphatic: Aortic atherosclerosis. No enlarged abdominal or pelvic lymph nodes. Reproductive: Uterus and bilateral adnexa are unremarkable. Other: No abdominal  wall hernia or abnormality. No abdominopelvic ascites. Musculoskeletal: No acute or significant osseous findings. IMPRESSION: Probable hepatic steatosis. Sigmoid diverticulosis without inflammation. No acute abnormality seen in the abdomen or pelvis. Aortic  Atherosclerosis (ICD10-I70.0). Electronically Signed   By: Marijo Conception M.D.   On: 01/02/2021 15:57    IMPRESSION/PLAN:  65 year old female with a history of GERD on chronic PPI admitted to the hospital 01/02/2021 with acute on chronic N/V/D with generalized weakness and associated electrolyte derangement.  Admission potassium 2.7 and magnesium 0.8.  Today potassium 134 and magnesium 2.0.  Normal LFTs lipase level.  CTAP findings did not explain her symptoms. -Hold PPI -Compazine 10 mg IV every 4 hours as needed (Zofran not prescribed secondary to prolonged QT interval) -BMP in a.m. -Ok to give Mylanta as needed -RUQ sono to further evaluate the gallbladder -EGD benefits and risks discussed including risk with sedation, risk of bleeding, perforation and infection tentatively scheduled for tomorrow -NPO after midnight  Diarrhea, chronic x 57-month  -Eventual colonoscopy as an outpatient to allow time for resolution of electrolyte derangement -Check C. Diff and GI pathogen panel if diarrhea worsens.  Nursing staff to document BMs in chart.  History of alcohol use disorder. Abstinent from alcohol since March 2022, then drank 1/2 wine cooler 1 weeks ago.  Recommended no alcohol   History of recurrent hyponatremia, SIADH. Na+ 134.  History of seizures.  During her 10/2020 hospitalization with hyponatremia she had seizure like activity but was thought not to be a true seizure.  Weight loss secondary to # 1 -Agree with nutritional consult  History of paroxysmal atrial fibrillation not on anticoagulation  Await further recommendations per Dr. APhylliss BlakesMDorathy Daft 01/03/2021, 3:14 PM

## 2021-01-03 NOTE — Evaluation (Signed)
Physical Therapy Evaluation Patient Details Name: Karen Casey MRN: [id number] DOB: 1955/09/27 Today's Date: 01/03/2021   History of Present Illness  Karen Casey is a 65 y.o. female presenting with vomiting and diarrhea . PMH is significant for anxiety, depression, seizure disorder, GERD, HTN, ETOH abuse and PAF.  Clinical Impression  Patient is received in bed, she reports she has been in hospital several times in the last few months. Wants to go home. Patient reports some "shakiness" in legs with walking.  Has not been using AD. Patient is mod independent with bed mobility and transfers with min guard. Ambulated in room without AD with min guard. Reaches out for furniture to steady. She would like to try using cane for support. Patient will continue to benefit from skilled PT while here to improve strength and functional independence.     Follow Up Recommendations Home health PT    Equipment Recommendations  Cane    Recommendations for Other Services       Precautions / Restrictions Precautions Precautions: None Restrictions Weight Bearing Restrictions: No      Mobility  Bed Mobility Overal bed mobility: Modified Independent             General bed mobility comments: increased time and effort    Transfers Overall transfer level: Needs assistance Equipment used: None Transfers: Sit to/from Stand Sit to Stand: Supervision;Min guard            Ambulation/Gait Ambulation/Gait assistance: Min guard Gait Distance (Feet): 30 Feet Assistive device: None Gait Pattern/deviations: Step-through pattern;Decreased step length - right;Decreased step length - left Gait velocity: decr   General Gait Details: patient wants to reach out for furniture for [place]. Does not want to use walker as she cannot use on in her home. Wants to try cane.  Stairs            Wheelchair Mobility    Modified Rankin (Stroke Patients Only)       Balance Overall  balance assessment: Needs assistance Sitting-balance support: Feet supported Sitting balance-Leahy Scale: Good     Standing balance support: Single extremity supported;During functional activity Standing balance-Leahy Scale: Fair Standing balance comment: reaches out with UE for support, min guard                             Pertinent Vitals/Pain Pain Assessment: Faces Faces Pain Scale: Hurts a little bit Pain Location: general/back Pain Descriptors / Indicators: Aching;Discomfort Pain Intervention(s): Monitored during session    Home Living Family/patient expects to be discharged to:: Private residence Living Arrangements: Spouse/significant other Available Help at Discharge: Family;Available 24 hours/day Type of Home: Apartment Home Access: Level entry     Home Layout: One level Home Equipment: Walker - 2 wheels;Bedside commode      Prior Function Level of Independence: Needs assistance         Comments: states she furniture walks, has a walker, but doesnt use and space is too small in her home to use. Would like to try a cane. history of falls.     Hand Dominance   Dominant Hand: Right    Extremity/Trunk Assessment   Upper Extremity Assessment Upper Extremity Assessment: Defer to OT evaluation    Lower Extremity Assessment Lower Extremity Assessment: Generalized weakness    Cervical / Trunk Assessment Cervical / Trunk Assessment: Normal  Communication   Communication: No difficulties  Cognition Arousal/Alertness: Awake/alert Behavior During Therapy: WFL for tasks assessed/performed  Overall Cognitive Status: Within Functional Limits for tasks assessed                                        General Comments      Exercises     Assessment/Plan    PT Assessment Patient needs continued PT services  PT Problem List Decreased strength;Decreased mobility;Decreased activity tolerance;Decreased balance;Pain;Decreased knowledge  of use of DME       PT Treatment Interventions DME instruction;Therapeutic exercise;Gait training;Balance training;Functional mobility training;Therapeutic activities;Patient/family education    PT Goals (Current goals can be found in the Care Plan section)  Acute Rehab PT Goals Patient Stated Goal: to return home PT Goal Formulation: With patient Time For Goal Achievement: 01/10/21 Potential to Achieve Goals: Good    Frequency Min 3X/week   Barriers to discharge Decreased caregiver support states she lives with significant other, he has his own health issues.    Co-evaluation               AM-PAC PT "6 Clicks" Mobility  Outcome Measure Help needed turning from your back to your side while in a flat bed without using bedrails?: A Little Help needed moving from lying on your back to sitting on the side of a flat bed without using bedrails?: A Little Help needed moving to and from a bed to a chair (including a wheelchair)?: A Little Help needed standing up from a chair using your arms (e.g., wheelchair or bedside chair)?: A Little Help needed to walk in hospital room?: A Little Help needed climbing 3-5 steps with a railing? : A Lot 6 Click Score: 17    End of Session   Activity Tolerance: Patient tolerated treatment well Patient left: in bed;with call bell/phone within reach Nurse Communication: Mobility status PT Visit Diagnosis: Unsteadiness on feet (R26.81);Muscle weakness (generalized) (M62.81);Difficulty in walking, not elsewhere classified (R26.2)    Time: [id number] PT Time Calculation (min) (ACUTE ONLY): 16 min   Charges:   PT Evaluation $PT Eval Moderate Complexity: 1 Mod          Tyjai Matuszak, PT, GCS 01/03/21,11:25 AM

## 2021-01-03 NOTE — Evaluation (Signed)
Occupational Therapy Evaluation Patient Details Name: Karen Casey MRN: [id number] DOB: 02-24-1956 Today's Date: 01/03/2021    History of Present Illness Karen Casey is a 65 y.o. female presenting with vomiting and diarrhea . PMH is significant for anxiety, depression, seizure disorder, GERD, HTN, ETOH abuse and PAF.   Clinical Impression   Pt admitted for concerns listed above. PTA pt reported independence with all ADL's and IADL's, using no AD. This session pt presented with decreased activity tolerance and balance deficits, requiring supervision for ADL's and sup to min guard for transfers and OOB mobility. OT will continue to follow acutely to address deficits listed below.     Follow Up Recommendations  No OT follow up;Supervision - Intermittent    Equipment Recommendations  None recommended by OT    Recommendations for Other Services       Precautions / Restrictions Precautions Precautions: None Restrictions Weight Bearing Restrictions: No      Mobility Bed Mobility Overal bed mobility: Modified Independent             General bed mobility comments: increased time and effort    Transfers Overall transfer level: Needs assistance Equipment used: None Transfers: Sit to/from Stand Sit to Stand: Supervision;Min guard              Balance Overall balance assessment: Needs assistance Sitting-balance support: Feet supported Sitting balance-Leahy Scale: Good     Standing balance support: No upper extremity supported;During functional activity Standing balance-Leahy Scale: Fair Standing balance comment: reaches out with UE for support, min guard                           ADL either performed or assessed with clinical judgement   ADL Overall ADL's : Needs assistance/impaired                                       General ADL Comments: Pt at overall supervision levels with mild balance concerns. Pt reports her biggest  concern is safety with walking, as she has fallen at least 2 times before.     Vision Baseline Vision/History: Wears glasses Wears Glasses: Reading only Patient Visual Report: No change from baseline Vision Assessment?: No apparent visual deficits     Perception Perception Perception Tested?: No   Praxis Praxis Praxis tested?: Not tested    Pertinent Vitals/Pain Pain Assessment: 0-10 Pain Score: 4  Pain Location: Anterior Rib cage and abdomen Pain Descriptors / Indicators: Aching;Discomfort Pain Intervention(s): Monitored during session;Repositioned     Hand Dominance Right   Extremity/Trunk Assessment Upper Extremity Assessment Upper Extremity Assessment: Overall WFL for tasks assessed   Lower Extremity Assessment Lower Extremity Assessment: Defer to PT evaluation   Cervical / Trunk Assessment Cervical / Trunk Assessment: Normal   Communication Communication Communication: No difficulties   Cognition Arousal/Alertness: Awake/alert Behavior During Therapy: WFL for tasks assessed/performed Overall Cognitive Status: Within Functional Limits for tasks assessed                                     General Comments  VSS on RA    Exercises     Shoulder Instructions      Home Living Family/patient expects to be discharged to:: Private residence Living Arrangements: Spouse/significant other Available Help at Discharge: Family;Available  24 hours/day Type of Home: Apartment Home Access: Level entry     Home Layout: One level     Bathroom Shower/Tub: Teacher, early years/pre: Standard Bathroom Accessibility: Yes How Accessible: Accessible via walker Home Equipment: [place] - 2 wheels;Bedside commode          Prior Functioning/Environment Level of Independence: Needs assistance        Comments: states she furniture walks, has a walker, but doesnt use and space is too small in her home to use. Would like to try a cane. history of  falls.        OT Problem List: Decreased strength;Decreased activity tolerance;Impaired balance (sitting and/or standing);Decreased safety awareness      OT Treatment/Interventions: Self-care/ADL training;Therapeutic exercise;Energy conservation;DME and/or AE instruction;Therapeutic activities;Patient/family education;Balance training    OT Goals(Current goals can be found in the care plan section) Acute Rehab OT Goals Patient Stated Goal: to return home OT Goal Formulation: With patient/family Time For Goal Achievement: 01/17/21 Potential to Achieve Goals: Good ADL Goals Pt Will Perform Grooming: Independently;standing Pt Will Transfer to Toilet: Independently;ambulating Pt Will Perform Tub/Shower Transfer: Independently;ambulating Additional ADL Goal #1: Pt will report 3 fall prevention techniques that she can use in her home.  OT Frequency: Min 2X/week   Barriers to D/C:            Co-evaluation              AM-PAC OT "6 Clicks" Daily Activity     Outcome Measure Help from another person eating meals?: None Help from another person taking care of personal grooming?: A Little Help from another person toileting, which includes using toliet, bedpan, or urinal?: A Little Help from another person bathing (including washing, rinsing, drying)?: A Little Help from another person to put on and taking off regular upper body clothing?: A Little Help from another person to put on and taking off regular lower body clothing?: A Little 6 Click Score: 19   End of Session Nurse Communication: Mobility status  Activity Tolerance: Patient tolerated treatment well Patient left: in bed;with call bell/phone within reach;with family/visitor present  OT Visit Diagnosis: Unsteadiness on feet (R26.81);Other abnormalities of gait and mobility (R26.89);Muscle weakness (generalized) (M62.81)                Time: [id number] OT Time Calculation (min): 35 min Charges:  OT General Charges $OT  Visit: 1 Visit OT Evaluation $OT Eval Moderate Complexity: 1 Mod OT Treatments $Self Care/Home Management : 8-22 mins  Milind Raether H., OTR/L Acute Rehabilitation  Latrese Carolan Elane Yolanda Bonine 01/03/2021, 3:35 PM

## 2021-01-03 NOTE — H&P (View-Only) (Signed)
Referring Provider: Dr. Erling Cruz  Primary Care Physician:  [name], MD Primary Gastroenterologist:  Dr. Wilfrid Lund   Reason for Consultation:  N/V/D and weight loss  HPI: Karen Casey is a 65 y.o. 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   She presented to [facility] ED on 01/02/2021 for further evaluation regarding nausea, vomiting, diarrhea x 3 days, intermittent RUQ and LUQ pain, generalized weakness with associated weight loss.  Labs in the ED showed a sodium level 132.  Potassium 2.7.  Magnesium 0.8.  Glucose 118.  Creatinine 0.8.  BUN 5.  Alk phos 91.  AST 34.  ALT 20.  Total bili 0.6.  Lipase 28.  WBC 8.4.  Hemoglobin 16.5 (baseline Hg 14.4 on 12/05/2020). Hematocrit 49.0.  Platelet 221.  SARS coronavirus 2 negative.  Urine with small quantity of leukocytes and urinary WBC 6-10.  CTAP with contrast showed hepatic steatosis, no acute intra abdominal or pelvic inflammatory or infectious process.  She received IV potassium and magnesium replacement, antiemetics and IV fluids.  PPI was held.  She was admitted to the telemetry unit.  A GI consult was requested due to the patient having N/V/D which resulted in significant electrolyte derangement.  She reports having episodes of nausea and vomiting which typically last for about 3 days every 6 to 8 weeks for many years.  She has constant nausea which worsens then she gags and dry heaves and intermittently spontaneously vomits partially digested food or ileus type fluid.  She frequently induces vomiting during these episodes to relieve the constant dry heaves cycle.  Approximately 6 months ago she vomited and saw a small amount of bright red blood in emesis on 2-3 occasions  without recurrence.  She has constant epigastric pain and during the episodes of N/V she has worse upper as well as lower abdominal pain.  She reports having dark brown syrup like diarrhea which occurs 2-4 times daily shortly after she eats any food for the past 4 months.  No specific food triggers.  No bloody diarrhea.  Today's RN has not witnessed any bowel movements as the patient has independent bathroom privileges.  She reports losing 16 pounds since March 2022.  No fever, sweats or chills.  No recent antibiotics.  No NSAID use.  Her nutritional intake is poor.  History of alcohol use disorder.  She previously drank 6 beers daily for 10 years and quit drinking in March 2022.  She drank one half of a wine cooler 1 week ago.  She infrequently smokes marijuana.  She reports taking Nexium for 2 to 3 years then switched to Protonix 40 mg daily March 2022.  She underwent an EGD 10/2012 which showed multiple erosions at the EG junction consistent with esophagitis.  A colonoscopy was done 11/2012 which was normal.  No known family history of esophageal, gastric or colon cancer.  CTAP with contrast 01/02/2021: Probable hepatic steatosis.  No gallstones or biliary dilatation. Pancreas was unremarkable, no pancreatic ductal dilatation or surrounding inflammatory changes. Sigmoid diverticulosis without inflammation.  No acute abnormality seen in the abdomen or pelvis.  Aortic Atherosclerosis  Echo 09/10/2020: 1. Left ventricular ejection fraction, by estimation, is 60 to 65%. The left ventricle has normal function. The left ventricle has no regional wall motion abnormalities.  There is mild left ventricular hypertrophy. Left ventricular diastolic parameters were normal. 2. Right ventricular systolic function is normal. The right ventricular size is normal. There is normal pulmonary artery systolic pressure. 3. The mitral valve is normal in structure. Trivial mitral valve regurgitation. No evidence of mitral  stenosis. 4. The aortic valve is tricuspid. Aortic valve regurgitation is not visualized. No aortic stenosis is present. 5. The inferior vena cava is normal in size with greater than 50% respiratory variability, suggesting right atrial pressure of 3 mmHg.  EGD 10/26/2012 Dr. Kaplan: Multiple erosions at the GE junction, esophagitis Biopsies consistent with GERD.  Colonoscopy 11/28/2012 by Dr. Kaplan: Normal colonoscopy, no polyps Recall colonoscopy 10 years - UNREMARKABLE COLONIC MUCOSA. - NO MICROSCOPIC COLITIS, ACTIVE INFLAMMATION OR GRANULOMAS.  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 Authorizing Provider  acetaminophen (TYLENOL) 500 MG tablet Take 1,000 mg by mouth every 6 (six) hours as needed for mild pain.   Yes [provider]  albuterol (VENTOLIN HFA) 108 (90 Base) MCG/ACT inhaler Inhale 2 puffs into the lungs every 6 (six) hours as needed for wheezing or shortness of breath. 12/08/20  Yes Cantwell, Celeste C, PA-C  ALPRAZolam (XANAX) 0.5 MG tablet TAKE 1 TABLET BY MOUTH DAILY AS NEEDED FOR ANXIETY. DO NOT FILL UNTIL 12/13/2020 Patient taking differently: Take 0.5 mg by mouth daily as needed for anxiety. 01/02/21  Yes Pray, Margaret E, MD  amLODipine (NORVASC) 5 MG tablet Take 1 tablet (5 mg total) by mouth daily. 12/08/20 04/07/21 Yes Cantwell, Celeste C, PA-C  calcitonin, salmon, (MIACALCIN/FORTICAL) 200 UNIT/ACT nasal spray PLACE 1 SPRAY INTO  ALTERNATE NOSTRILS DAILY. 12/24/20 01/23/21 Yes Pray, Margaret E, MD  clonazePAM (KLONOPIN) 1 MG tablet Take 1 tablet (1 mg total) by mouth 2 (two) times daily for 28 days. DO NOT TAKE MORE Than 2 tabs daily. Do not fill until 12/13/2020. 01/02/21 01/30/21 Yes Pray, Margaret E, MD  gabapentin (NEURONTIN) 600 MG tablet Take 2 tablets (1,200 mg total) by mouth 3 (three) times daily. 10/17/20  Yes Patel, Pranav M, MD  hydrOXYzine (VISTARIL) 25 MG capsule Take 25 mg by mouth 4 (four) times daily. 12/06/20  Yes [provider]  Multiple Vitamins-Minerals (CENTRUM SILVER 50+WOMEN PO) Take 1 tablet by mouth daily.   Yes [provider]  ondansetron (ZOFRAN ODT) 8 MG disintegrating tablet Take 1 tablet (8 mg total) by mouth every 8 (eight) hours as needed for nausea or vomiting. 01/02/21  Yes Ray, Danielle, MD  pantoprazole (PROTONIX) 40 MG tablet Take 1 tablet (40 mg total) by mouth daily. 11/12/20  Yes Pray, Margaret E, MD  potassium chloride SA (KLOR-CON) 20 MEQ tablet Take 1 tablet (20 mEq total) by mouth 2 (two) times daily. 01/02/21  Yes Ray, Danielle, MD  QUEtiapine (SEROQUEL) 50 MG tablet TAKE 1 TABLET BY MOUTH EVERYDAY AT BEDTIME Patient taking   differently: Take 50 mg by mouth at bedtime. 01/02/21  Yes Pray, Margaret E, MD  umeclidinium-vilanterol (ANORO ELLIPTA) 62.5-25 MCG/INH AEPB Inhale 1 puff into the lungs daily. 12/17/20 01/16/21 Yes Pray, Margaret E, MD    Current Facility-Administered Medications  Medication Dose Route Frequency Provider Last Rate Last Admin   acetaminophen (TYLENOL) tablet 1,000 mg  1,000 mg Oral Q6H PRN Patel, Poonam, MD       albuterol (PROVENTIL) (2.5 MG/3ML) 0.083% nebulizer solution 3 mL  3 mL Inhalation Q6H PRN Patel, Poonam, MD       ALPRAZolam (XANAX) tablet 0.5 mg  0.5 mg Oral Daily PRN Patel, Poonam, MD   0.5 mg at 01/03/21 1315   clonazePAM (KLONOPIN) tablet 1 mg  1 mg Oral BID Patel, Poonam, MD   1 mg at 01/03/21 0905   gabapentin (NEURONTIN) tablet 1,200 mg   1,200 mg Oral TID Patel, Poonam, MD   1,200 mg at 01/03/21 0905   hydrOXYzine (ATARAX/VISTARIL) tablet 25 mg  25 mg Oral QID Patel, Poonam, MD   25 mg at 01/03/21 0905   nicotine (NICODERM CQ - dosed in mg/24 hours) patch 14 mg  14 mg Transdermal Daily Norbert, John, MD   14 mg at 01/03/21 0937   pantoprazole (PROTONIX) EC tablet 40 mg  40 mg Oral Daily Lynn, Catherine, MD   40 mg at 01/03/21 1315   prochlorperazine (COMPAZINE) injection 10 mg  10 mg Intravenous Q4H PRN Patel, Poonam, MD       QUEtiapine (SEROQUEL) tablet 50 mg  50 mg Oral QHS Patel, Poonam, MD   50 mg at 01/02/21 2306   umeclidinium-vilanterol (ANORO ELLIPTA) 62.5-25 MCG/INH 1 puff  1 puff Inhalation Daily Patel, Poonam, MD   1 puff at 01/03/21 0725    Allergies as of 01/02/2021   (No Known Allergies)    Family History  Problem Relation Age of Onset   Hypertension Mother    Hyperlipidemia Mother    Aneurysm Mother        Rupture - Cause of death   Heart disease Father        MI - cause of death   Depression Father    Parkinsonism Father    Hypertension Sister    Colon cancer Neg Hx    Esophageal cancer Neg Hx    Rectal cancer Neg Hx    Stomach cancer Neg Hx     Social History   Socioeconomic History   Marital status: Single    Spouse name: Not on file   Number of children: Not on file   Years of education: Not on file   Highest education level: Not on file  Occupational History   Not on file  Tobacco Use   Smoking status: Every Day    Packs/day: 1.00    Years: 43.00    Pack years: 43.00    Types: Cigarettes   Smokeless tobacco: Never  Vaping Use   Vaping Use: Never used  Substance and Sexual Activity   Alcohol use: Yes    Alcohol/week: 3.0 standard drinks    Types: 3 Cans of beer per week    Comment: 2-3 times   Drug use: Yes    Types: Marijuana, Other-see comments    Comment: Past hx of benzo abuse--xanax   Sexual activity: Not on file  Other Topics Concern   Not on file  Social History  Narrative   Not on file   Social Determinants of Health   Financial Resource   Strain: Not on file  Food Insecurity: Not on file  Transportation Needs: Not on file  Physical Activity: Not on file  Stress: Not on file  Social Connections: Not on file  Intimate Partner Violence: Not on file    Review of Systems: See HPI, all other systems reviewed and are negative   Physical Exam: Vital signs in last 24 hours: Temp:  [97.7 F (36.5 C)-98.2 F (36.8 C)] 98.2 F (36.8 C) (07/23 0945) Pulse Rate:  [84-103] 92 (07/23 0945) Resp:  [11-28] 18 (07/23 0945) BP: (98-142)/(64-96) 114/70 (07/23 0945) SpO2:  [89 %-99 %] 91 % (07/23 0945) Weight:  [83.4 kg] 83.4 kg (07/23 0109)   General:  Alert 64-year-old female in no acute distress. Head:  Normocephalic and atraumatic. Eyes:  No scleral icterus. Conjunctiva pink. Ears:  Normal auditory acuity. Nose:  No deformity, discharge or lesions. Mouth: Very poor dentition, chipped front lower tooth.  No ulcers or lesions.  Neck:  Supple. No lymphadenopathy or thyromegaly.  Lungs: Breath sounds clear throughout. Heart: Regular rate and rhythm, no murmurs. Abdomen: Soft, mild epigastric tenderness without rebound or guarding.  Positive bowel sounds all 4 quadrants. Rectal: Deferred. Musculoskeletal:  Symmetrical without gross deformities.  Pulses:  Normal pulses noted. Extremities:  Without clubbing or edema. Neurologic:  Alert and  oriented x4. No focal deficits.  Skin:  Intact without significant lesions or rashes. Psych:  Alert and cooperative. Normal mood and affect.  Intake/Output from previous day: 07/22 0701 - 07/23 0700 In: 2071.1 [P.O.:480; IV Piggyback:1591.1] Out: 700 [Urine:700] Intake/Output this shift: Total I/O In: 240 [P.O.:240] Out: -   Lab Results: Recent Labs    01/02/21 1021 01/03/21 0134  WBC 8.4 8.7  HGB 16.5* 14.0  HCT 49.0* 41.4  PLT 221 150   BMET Recent Labs    01/02/21 1021 01/02/21 1705  01/03/21 0134 01/03/21 0604  NA 132*  --  135 136  K 2.7* 2.8* 3.7 3.6  CL 95*  --  102 106  CO2 25  --  25 25  GLUCOSE 118*  --  84 88  BUN 5*  --  5* <5*  CREATININE 0.88  --  0.69 0.58  CALCIUM 8.0*  --  7.6* 7.5*   LFT Recent Labs    01/02/21 1328  PROT 6.2*  ALBUMIN 3.3*  AST 34  ALT 20  ALKPHOS 91  BILITOT 0.6  BILIDIR 0.2  IBILI 0.4   PT/INR No results for input(s): LABPROT, INR in the last 72 hours. Hepatitis Panel No results for input(s): HEPBSAG, HCVAB, HEPAIGM, HEPBIGM in the last 72 hours.    Studies/Results: CT ABDOMEN PELVIS W CONTRAST  Result Date: 01/02/2021 CLINICAL DATA:  Acute generalized abdominal pain. EXAM: CT ABDOMEN AND PELVIS WITH CONTRAST TECHNIQUE: Multidetector CT imaging of the abdomen and pelvis was performed using the standard protocol following bolus administration of intravenous contrast. CONTRAST:  100mL OMNIPAQUE IOHEXOL 300 MG/ML  SOLN COMPARISON:  Oct 12, 2020. FINDINGS: Lower chest: No acute abnormality. Hepatobiliary: No gallstones or biliary dilatation is noted. Probable hepatic steatosis. Pancreas: Unremarkable. No pancreatic ductal dilatation or surrounding inflammatory changes. Spleen: Normal in size without focal abnormality. Adrenals/Urinary Tract: Adrenal glands are unremarkable. Kidneys are normal, without renal calculi, focal lesion, or hydronephrosis. Bladder is unremarkable. Stomach/Bowel: The stomach appears normal. There is no evidence of bowel obstruction or inflammation. Sigmoid diverticulosis is noted. Vascular/Lymphatic: Aortic atherosclerosis. No enlarged abdominal or pelvic lymph nodes. Reproductive: Uterus and bilateral adnexa are unremarkable. Other: No abdominal   wall hernia or abnormality. No abdominopelvic ascites. Musculoskeletal: No acute or significant osseous findings. IMPRESSION: Probable hepatic steatosis. Sigmoid diverticulosis without inflammation. No acute abnormality seen in the abdomen or pelvis. Aortic  Atherosclerosis (ICD10-I70.0). Electronically Signed   By: Marijo Conception M.D.   On: 01/02/2021 15:57    IMPRESSION/PLAN:  65 year old female with a history of GERD on chronic PPI admitted to the hospital 01/02/2021 with acute on chronic N/V/D with generalized weakness and associated electrolyte derangement.  Admission potassium 2.7 and magnesium 0.8.  Today potassium 134 and magnesium 2.0.  Normal LFTs lipase level.  CTAP findings did not explain her symptoms. -Hold PPI -Compazine 10 mg IV every 4 hours as needed (Zofran not prescribed secondary to prolonged QT interval) -BMP in a.m. -Ok to give Mylanta as needed -RUQ sono to further evaluate the gallbladder -EGD benefits and risks discussed including risk with sedation, risk of bleeding, perforation and infection tentatively scheduled for tomorrow -NPO after midnight  Diarrhea, chronic x 69-month  -Eventual colonoscopy as an outpatient to allow time for resolution of electrolyte derangement -Check C. Diff and GI pathogen panel if diarrhea worsens.  Nursing staff to document BMs in chart.  History of alcohol use disorder. Abstinent from alcohol since March 2022, then drank 1/2 wine cooler 1 weeks ago.  Recommended no alcohol   History of recurrent hyponatremia, SIADH. Na+ 134.  History of seizures.  During her 10/2020 hospitalization with hyponatremia she had seizure like activity but was thought not to be a true seizure.  Weight loss secondary to # 1 -Agree with nutritional consult  History of paroxysmal atrial fibrillation not on anticoagulation  Await further recommendations per Dr. APhylliss BlakesMDorathy Daft 01/03/2021, 3:14 PM

## 2021-01-03 NOTE — Plan of Care (Signed)
  Problem: Education: Goal: Knowledge of General Education information will improve Description: Including pain rating scale, medication(s)/side effects and non-pharmacologic comfort measures Outcome: Progressing   Problem: Clinical Measurements: Goal: Ability to maintain clinical measurements within normal limits will improve Outcome: Progressing Goal: Will remain free from infection Outcome: Progressing   Problem: Nutrition: Goal: Adequate nutrition will be maintained Outcome: Progressing   Problem: Education: Goal: Knowledge of disease or condition will improve Outcome: Progressing   Problem: Safety: Goal: Ability to remain free from injury will improve Outcome: Progressing

## 2021-01-04 ENCOUNTER — Encounter (HOSPITAL_COMMUNITY): Payer: Self-pay | Admitting: Family Medicine

## 2021-01-04 ENCOUNTER — Observation Stay (HOSPITAL_COMMUNITY): Payer: 59

## 2021-01-04 ENCOUNTER — Encounter (HOSPITAL_COMMUNITY)
Admission: EM | Disposition: A | Discharge status: Home / Self Care | Payer: Self-pay | Source: Home / Self Care | Attending: Emergency Medicine

## 2021-01-04 ENCOUNTER — Observation Stay (HOSPITAL_COMMUNITY): Payer: 59 | Admitting: Anesthesiology

## 2021-01-04 DIAGNOSIS — R1013 Epigastric pain: Secondary | ICD-10-CM | POA: Diagnosis not present

## 2021-01-04 DIAGNOSIS — Z20822 Contact with and (suspected) exposure to covid-19: Secondary | ICD-10-CM | POA: Diagnosis not present

## 2021-01-04 DIAGNOSIS — J449 Chronic obstructive pulmonary disease, unspecified: Secondary | ICD-10-CM | POA: Diagnosis not present

## 2021-01-04 DIAGNOSIS — K3189 Other diseases of stomach and duodenum: Secondary | ICD-10-CM | POA: Diagnosis not present

## 2021-01-04 DIAGNOSIS — E876 Hypokalemia: Secondary | ICD-10-CM | POA: Diagnosis not present

## 2021-01-04 DIAGNOSIS — K449 Diaphragmatic hernia without obstruction or gangrene: Secondary | ICD-10-CM | POA: Diagnosis not present

## 2021-01-04 DIAGNOSIS — R112 Nausea with vomiting, unspecified: Secondary | ICD-10-CM | POA: Diagnosis not present

## 2021-01-04 HISTORY — PX: ESOPHAGOGASTRODUODENOSCOPY (EGD) WITH PROPOFOL: SHX5813

## 2021-01-04 HISTORY — PX: BIOPSY: SHX5522

## 2021-01-04 SURGERY — ESOPHAGOGASTRODUODENOSCOPY (EGD) WITH PROPOFOL
Anesthesia: Monitor Anesthesia Care

## 2021-01-04 MED ORDER — ADULT MULTIVITAMIN W/MINERALS CH
1.0000 | ORAL_TABLET | Freq: Every day | ORAL | Status: DC
  Administered 2021-01-04 – 2021-01-05 (×2): 1 via ORAL
  Filled 2021-01-04 (×2): qty 1

## 2021-01-04 MED ORDER — ONDANSETRON HCL 4 MG/2ML IJ SOLN
INTRAMUSCULAR | Status: DC | PRN
  Administered 2021-01-04: 4 mg via INTRAVENOUS

## 2021-01-04 MED ORDER — LACTATED RINGERS IV SOLN
INTRAVENOUS | Status: AC | PRN
  Administered 2021-01-04: 10 mL/h via INTRAVENOUS

## 2021-01-04 MED ORDER — PROPOFOL 10 MG/ML IV BOLUS
INTRAVENOUS | Status: DC | PRN
  Administered 2021-01-04: 30 mg via INTRAVENOUS

## 2021-01-04 MED ORDER — GUAIFENESIN ER 600 MG PO TB12: 600.0000 mg | ORAL_TABLET | Freq: Two times a day (BID) | ORAL | Status: DC | PRN

## 2021-01-04 MED ORDER — THIAMINE HCL 100 MG PO TABS
100.0000 mg | ORAL_TABLET | Freq: Every day | ORAL | Status: DC
  Administered 2021-01-04 – 2021-01-05 (×2): 100 mg via ORAL
  Filled 2021-01-04 (×2): qty 1

## 2021-01-04 MED ORDER — PROPOFOL 500 MG/50ML IV EMUL
INTRAVENOUS | Status: DC | PRN
  Administered 2021-01-04: 100 ug/kg/min via INTRAVENOUS

## 2021-01-04 MED ORDER — PANTOPRAZOLE SODIUM 40 MG PO TBEC
40.0000 mg | DELAYED_RELEASE_TABLET | Freq: Every day | ORAL | Status: DC
  Administered 2021-01-04 – 2021-01-05 (×2): 40 mg via ORAL
  Filled 2021-01-04 (×2): qty 1

## 2021-01-04 MED ORDER — ENSURE ENLIVE PO LIQD
237.0000 mL | Freq: Two times a day (BID) | ORAL | Status: DC
  Administered 2021-01-04: 237 mL via ORAL

## 2021-01-04 SURGICAL SUPPLY — 15 items

## 2021-01-04 NOTE — Progress Notes (Addendum)
FMTS Attending Daily Note:  Karen Netters MD Personal pager:  [phone] FPTS Service Pager:  [phone]  I have seen and examined this patient and have reviewed their chart. I have discussed this patient with the resident. I agree with the resident's findings, assessment and care plan.  Additionally:  Reports nausea today. Had EGD that was overall unrevealing; biopsies taken, small hiatal hernia.   Keeping overnight one more night to check AM cortisol, recheck electrolytes. Planning for discharge tomorrow. Will need outpatient follow up with GI for colonoscopy.  Karen Rio, MD 01/04/2021  -----------------------------------------------------    Family Medicine Teaching Service Daily Progress Note Intern Pager: [phone]  Patient name: Karen Casey Medical record number: [id number] Date of birth: 1956/06/03 Age: 65 y.o. Gender: female  Primary Care Provider: [name], MD Consultants: GI Code Status: Full  Pt Overview and Major Events to Date:  07/22: Admitted  Assessment and Plan: Karen Casey is a 65 y.o. female presenting with vomiting and diarrhea . PMH is significant for anxiety, depression, seizure disorder, GERD, HTN, ETOH abuse and PAF.   Hypokalemia and hypomagnesemia  Resolved after repletion -BMP and Mag pending    Nausea, vomiting, diarrhea No episodes of nausea, vomiting or diarrhea overnight.  EGD today shows small 1 cm hiatal hernia.   -GI Following -F/U RUQ u/s -F/U EGD results and recommendations  -Normal saline 110 cc/hr -Compazine 10 mg q4h -monitor BMP; K & Mg q4h -consider GI consult with worsening symptoms   Prolonged QT Patient Qt on admission 405 -Avoid meds that prolong QT -Monitor QT daily   Hypertension Normotensive -Continue to hold Amlodipine for soft BP  Depression  Anxiety  h/o benzodiazepine withdrawal seizures  Well controlled Continue home medications -Delirium precautions -Continue to monitor  for withdrawal  Alcohol use disorder CIWA's not documented overnight. No auditory of visual disturbances.   -Continue CIWA monitoring  Chronic hypoxemic respiratory failure O2 Sats 94-95% -Intermittent use of supplemental O2 N/C -Continue to monitor saturations to maintain sats>88%   GERD Continue Protonix   Tobacco use disorder -Nicotine patch -Encourage smoking cessation  Chronic pain -Continue Gabapentin 600 mg TID -Continue Tylenol 1000 mg q6h    FEN/GI: DAT PPx: Lovenox Dispo:Home pending clinical improvement . Barriers include Pending EGD.   Subjective:  No acute events overnight.  NPO for EGD in am.  Denies any nausea, vomiting or diarrhea.  No concerns voiced.  Objective: Temp:  [98.2 F (36.8 C)-98.8 F (37.1 C)] 98.4 F (36.9 C) (07/24 0535) Pulse Rate:  [80-92] 80 (07/24 0535) Resp:  [14-22] 19 (07/24 0535) BP: (101-131)/(62-82) 131/82 (07/24 0535) SpO2:  [91 %-99 %] 93 % (07/24 0535) Physical Exam: General: 65 y.o. female in NAD Cardio: RRR no m/r/g Lungs: CTAB, no wheezing, no rhonchi, no crackles, no IWOB on room air Abdomen: Soft, non-tender to palpation, non-distended, positive bowel sounds Skin: warm and dry Extremities: No edema   Laboratory: Recent Labs  Lab 01/02/21 1021 01/03/21 0134  WBC 8.4 8.7  HGB 16.5* 14.0  HCT 49.0* 41.4  PLT 221 150   Recent Labs  Lab 01/02/21 1328 01/02/21 1705 01/03/21 0134 01/03/21 0604 01/03/21 1504  NA  --   --  135 136 134*  K  --    < > 3.7 3.6 3.8  CL  --   --  102 106 101  CO2  --   --  '25 25 26  '$ BUN  --   --  5* <5* 5*  CREATININE  --   --  0.69 0.58 0.83  CALCIUM  --   --  7.6* 7.5* 8.3*  PROT 6.2*  --   --   --   --   BILITOT 0.6  --   --   --   --   ALKPHOS 91  --   --   --   --   ALT 20  --   --   --   --   AST 34  --   --   --   --   GLUCOSE  --   --  84 88 116*   < > = values in this interval not displayed.    Imaging/Diagnostic Tests: No results found.   Karen Leitz,  MD 01/04/2021, 6:25 AM PGY-3, [place] Intern pager: [phone], text pages welcome

## 2021-01-04 NOTE — Anesthesia Preprocedure Evaluation (Signed)
Anesthesia Evaluation  Patient identified by MRN, date of birth, ID band Patient awake    Reviewed: Allergy & Precautions, NPO status , Patient's Chart, lab work & pertinent test results  Airway Mallampati: II  TM Distance: >3 FB Neck ROM: Full    Dental  (+) Poor Dentition, Chipped   Pulmonary COPD,  COPD inhaler and oxygen dependent, Current Smoker and Patient abstained from smoking.,  On oxygen at night prn   Pulmonary exam normal breath sounds clear to auscultation       Cardiovascular hypertension, Pt. on medications Normal cardiovascular exam Rhythm:Regular Rate:Normal  ECG: rate 91   Neuro/Psych Seizures -,  PSYCHIATRIC DISORDERS Anxiety Depression    GI/Hepatic GERD  Medicated,(+)     substance abuse  ,   Endo/Other  negative endocrine ROS  Renal/GU negative Renal ROS     Musculoskeletal negative musculoskeletal ROS (+)   Abdominal   Peds  Hematology negative hematology ROS (+)   Anesthesia Other Findings Nausea, vomiting, epigastric pain  Reproductive/Obstetrics                             Anesthesia Physical Anesthesia Plan  ASA: 4  Anesthesia Plan: MAC   Post-op Pain Management:    Induction: Intravenous  PONV Risk Score and Plan: 1 and Propofol infusion and Treatment may vary due to age or medical condition  Airway Management Planned: Nasal Cannula  Additional Equipment:   Intra-op Plan:   Post-operative Plan:   Informed Consent: I have reviewed the patients History and Physical, chart, labs and discussed the procedure including the risks, benefits and alternatives for the proposed anesthesia with the patient or authorized representative who has indicated his/her understanding and acceptance.     Dental advisory given  Plan Discussed with: CRNA  Anesthesia Plan Comments:         Anesthesia Quick Evaluation

## 2021-01-04 NOTE — Anesthesia Postprocedure Evaluation (Signed)
Anesthesia Post Note  Patient: Karen Casey  Procedure(s) Performed: ESOPHAGOGASTRODUODENOSCOPY (EGD) WITH PROPOFOL BIOPSY     Patient location during evaluation: Endoscopy Anesthesia Type: MAC Level of consciousness: awake Pain management: pain level controlled Vital Signs Assessment: post-procedure vital signs reviewed and stable Respiratory status: spontaneous breathing, nonlabored ventilation, respiratory function stable and patient connected to nasal cannula oxygen Cardiovascular status: stable and blood pressure returned to baseline Postop Assessment: no apparent nausea or vomiting Anesthetic complications: no   No notable events documented.  Last Vitals:  Vitals:   01/04/21 0816 01/04/21 1001  BP: 137/85 (!) 145/96  Pulse: 78 84  Resp: (!) 23 18  Temp: (!) 36.3 C 36.7 C  SpO2: 96% 95%    Last Pain:  Vitals:   01/04/21 1001  TempSrc: Oral  PainSc:                  Andreya Lacks P Travaughn Vue

## 2021-01-04 NOTE — Anesthesia Procedure Notes (Signed)
Procedure Name: MAC Date/Time: 01/04/2021 7:34 AM Performed by: Eligha Bridegroom, CRNA Pre-anesthesia Checklist: Patient identified, Suction available, Emergency Drugs available, Patient being monitored and Timeout performed Patient Re-evaluated:Patient Re-evaluated prior to induction Oxygen Delivery Method: Nasal cannula Preoxygenation: Pre-oxygenation with 100% oxygen Induction Type: IV induction

## 2021-01-04 NOTE — Op Note (Signed)
[facility] Patient Name: Karen Casey Procedure Date : 01/04/2021 MRN: [id number] Attending MD: Carlota Raspberry. Havery Moros , MD Date of Birth: 1955/09/20 CSN: [id number] Age: 65 Admit Type: Inpatient Procedure:                Upper GI endoscopy Indications:              Epigastric abdominal pain, Diarrhea, Nausea with                            vomiting - intermittent, recent hospitalizations,                            also history of migraine headaches which can be                            associated Providers:                Carlota Raspberry. Havery Moros, MD, Jeanella Cara,                            RN, Benetta Spar, Technician Referring MD:              Medicines:                Monitored Anesthesia Care Complications:            No immediate complications. Estimated blood loss:                            Minimal. Estimated Blood Loss:     Estimated blood loss was minimal. Procedure:                Pre-Anesthesia Assessment:                           - Prior to the procedure, a History and Physical                            was performed, and patient medications and                            allergies were reviewed. The patient's tolerance of                            previous anesthesia was also reviewed. The risks                            and benefits of the procedure and the sedation                            options and risks were discussed with the patient.                            All questions were answered, and informed consent                            was obtained. Prior Anticoagulants:  The patient has                            taken no previous anticoagulant or antiplatelet                            agents. ASA Grade Assessment: III - A patient with                            severe systemic disease. After reviewing the risks                            and benefits, the patient was deemed in                            satisfactory condition  to undergo the procedure.                           After obtaining informed consent, the endoscope was                            passed under direct vision. Throughout the                            procedure, the patient's blood pressure, pulse, and                            oxygen saturations were monitored continuously. The                            [id number] Olympus endoscope was introduced                            through the mouth, and advanced to the second part                            of duodenum. The upper GI endoscopy was                            accomplished without difficulty. The patient                            tolerated the procedure well. Scope In: Scope Out: Findings:      Esophagogastric landmarks were identified: the Z-line was found at 37       cm, the gastroesophageal junction was found at 37 cm and the upper       extent of the gastric folds was found at 38 cm from the incisors.      A 1 cm hiatal hernia was present.      The gastroesophageal flap valve was visualized endoscopically and       classified as Hill Grade III (minimal fold, loose to endoscope, hiatal       hernia likely).      The exam of the esophagus was otherwise normal.      A single  localized diminutive erosion with no stigmata of recent       bleeding was found in the gastric antrum.      The exam of the stomach was otherwise normal.      Biopsies were taken with a cold forceps in the gastric body, at the       incisura and in the gastric antrum for Helicobacter pylori testing.      The duodenal bulb and second portion of the duodenum were normal.       Biopsies for histology were taken with a cold forceps for evaluation of       celiac disease. Impression:               - Esophagogastric landmarks identified.                           - 1 cm hiatal hernia.                           - Gastroesophageal flap valve classified as Hill                            Grade III  (minimal fold, loose to endoscope, hiatal                            hernia likely).                           - Single diminutive gastric erosion.                           - Normal stomach otherwise - biopsies taken to rule                            out H pylori                           - Normal duodenal bulb and second portion of the                            duodenum. Biopsied.                           No overt cause for symptoms on EGD, will await                            biopsies. Recommendation:           - Return patient to hospital ward for ongoing care.                           - Advance diet as tolerated.                           - Continue present medications.                           - Await pathology results.                           -  RUQ [place], rule out gallstones                           - AM cortisol level, assess for adrenal                            insufficiency given eletrolyte disturbances                           - consideration for TCA (Elavil), patient may have                            functional bowel disorder, cyclical vomiting                            syndrome - will discuss with primary team given                            other psychiatric medications                           - can consider sumitriptan as abortive therapy                            especially with onset with migraine headaches                           - consideration for aprepitant if nausea / vomiting                            persists despite other antiemetics                           - colonoscopy recommended at some point in time to                            rule out microscopic colitis, can be done as                            outpatient if symptoms can be managed in the interim Procedure Code(s):        --- Professional ---                           [phone], Esophagogastroduodenoscopy, flexible,                            transoral; with biopsy, single or  multiple Diagnosis Code(s):        --- Professional ---                           K44.9, Diaphragmatic hernia without obstruction or                            gangrene  K31.89, Other diseases of stomach and duodenum                           R10.13, Epigastric pain                           R19.7, Diarrhea, unspecified                           R11.2, Nausea with vomiting, unspecified CPT copyright 2019 American Medical Association. All rights reserved. The codes documented in this report are preliminary and upon coder review may  be revised to meet current compliance requirements. Remo Lipps P. Terra Aveni, MD 01/04/2021 8:01:52 AM This report has been signed electronically. Number of Addenda: 0

## 2021-01-04 NOTE — Interval H&P Note (Signed)
History and Physical Interval Note: Patient here for EGD. Unchanged from yesterday, she is stable. I have discussed risks / benefits of endoscopy and anesthesia and she wishes to proceed. Will plan on biopsies of the stomach and small bowel if normal given her symptoms. She agrees, all questions answered.   01/04/2021 7:29 AM  Karen Casey  has presented today for surgery, with the diagnosis of Nausea,  vomiting, epigastric pain.  The various methods of treatment have been discussed with the patient and family. After consideration of risks, benefits and other options for treatment, the patient has consented to  Procedure(s): ESOPHAGOGASTRODUODENOSCOPY (EGD) WITH PROPOFOL (N/A) as a surgical intervention.  The patient's history has been reviewed, patient examined, no change in status, stable for surgery.  I have reviewed the patient's chart and labs.  Questions were answered to the patient's satisfaction.     [place]

## 2021-01-04 NOTE — Progress Notes (Addendum)
FPTS Brief Progress Note  S Saw patient at bedside this evening. She has some questions regarding EGD results which we answered. No acute concerns.   O: BP 118/73 (BP Location: Left Arm)   Pulse 89   Temp 98.2 F (36.8 C) (Oral)   Resp 18   Ht '5\' 6"'$  (1.676 m)   Wt 79.8 kg   SpO2 95%   BMI 28.41 kg/m    General: Alert, no acute distress Cardio: well perfused  Pulm: normal work of breathing Neuro: Cranial nerves grossly intact   A/P: Plan per day team  -Possible d/c tomorrow  - Orders reviewed. Labs for AM ordered, which was adjusted as needed.    Lattie Haw, MD 01/04/2021, 10:54 PM PGY-3, [facility] Night Resident  Please page [phone] with questions.

## 2021-01-04 NOTE — Transfer of Care (Signed)
Immediate Anesthesia Transfer of Care Note  Patient: YAMINAH CLAYBORN  Procedure(s) Performed: ESOPHAGOGASTRODUODENOSCOPY (EGD) WITH PROPOFOL BIOPSY  Patient Location: PACU  Anesthesia Type:MAC  Level of Consciousness: awake, alert  and oriented  Airway & Oxygen Therapy: Patient Spontanous Breathing  Post-op Assessment: Report given to RN and Post -op Vital signs reviewed and stable  Post vital signs: Reviewed and stable  Last Vitals:  Vitals Value Taken Time  BP 121/77 01/04/21 0801  Temp    Pulse 83 01/04/21 0801  Resp 16 01/04/21 0801  SpO2 96 % 01/04/21 0801  Vitals shown include unvalidated device data.  Last Pain:  Vitals:   01/04/21 0714  TempSrc: Oral  PainSc: 0-No pain         Complications: No notable events documented.

## 2021-01-04 NOTE — Progress Notes (Signed)
Initial Nutrition Assessment  DOCUMENTATION CODES:   Not applicable  INTERVENTION:   Ensure Enlive po BID, each supplement provides 350 kcal and 20 grams of protein  Add MVI with Minerals daily  Add Thiamine 100 mg daily   NUTRITION DIAGNOSIS:   Inadequate oral intake related to decreased appetite, nausea, vomiting, diarrhea as evidenced by per patient/family report.  GOAL:   Patient will meet greater than or equal to 90% of their needs   MONITOR:   PO intake, Supplement acceptance, Labs, Weight trends  REASON FOR ASSESSMENT:   Malnutrition Screening Tool, Consult Assessment of nutrition requirement/status  ASSESSMENT:   65 yo female admitted with vomiting and diarrhea; PMH includes GERD, HTN, EtOh abuse, seizure disorder, anxiety/depression, SIADH  EGD today; unable to reach pt via phone Currently on CIWA protocol  Pt currently on Regular diet; recorded po intake 50-75% of meals yesterday; NPO this AM for procedure  Per chart review, pt reports hx of chronic abdominal pain and diarrhea with intermittent chronic N/V. Reported poor nutritional po intake  Weight appears relatively stable per weight encounters  Labs: reviewed Meds: reviewed   NUTRITION - FOCUSED PHYSICAL EXAM:  Unable to assess  Diet Order:   Diet Order             Diet regular Room service appropriate? Yes; Fluid consistency: Thin  Diet effective now                   EDUCATION NEEDS:   No education needs have been identified at this time  Skin:  Skin Assessment: Reviewed RN Assessment  Last BM:  7/23  Height:   Ht Readings from Last 1 Encounters:  01/04/21 '5\' 6"'$  (1.676 m)    Weight:   Wt Readings from Last 1 Encounters:  01/04/21 79.8 kg    BMI:  Body mass index is 28.41 kg/m.  Estimated Nutritional Needs:   Kcal:  1900-2100 kcals  Protein:  95-105 g  Fluid:  >/= 1.9 L   Kerman Passey MS, RDN, LDN, CNSC Registered Dietitian III Clinical Nutrition RD  Pager and On-Call Pager Number Located in [place]

## 2021-01-04 NOTE — Progress Notes (Signed)
FPTS Interim Night Progress Note  S:Patient sleeping comfortably.  Rounded with primary night RN.  No concerns voiced.  No orders required.    O: Today's Vitals   01/03/21 0945 01/03/21 1758 01/03/21 2006 01/03/21 2048  BP: 114/70 115/68  101/62  Pulse: 92 91  90  Resp: '18 17  14  '$ Temp: 98.2 F (36.8 C) 98.3 F (36.8 C)  98.8 F (37.1 C)  TempSrc: Oral Oral  Oral  SpO2: 91% 95%  94%  Weight:      Height:      PainSc:   0-No pain       A/P: Continue current management  Carollee Leitz MD PGY-3, [place] Medicine Service pager [phone]

## 2021-01-04 NOTE — Plan of Care (Signed)
  Problem: Clinical Measurements: Goal: Will remain free from infection Outcome: Completed/Met

## 2021-01-05 ENCOUNTER — Telehealth: Payer: Self-pay

## 2021-01-05 ENCOUNTER — Other Ambulatory Visit: Payer: Self-pay | Admitting: Family Medicine

## 2021-01-05 DIAGNOSIS — R197 Diarrhea, unspecified: Secondary | ICD-10-CM

## 2021-01-05 DIAGNOSIS — K760 Fatty (change of) liver, not elsewhere classified: Secondary | ICD-10-CM | POA: Clinically undetermined

## 2021-01-05 DIAGNOSIS — E876 Hypokalemia: Secondary | ICD-10-CM | POA: Diagnosis not present

## 2021-01-05 DIAGNOSIS — R531 Weakness: Secondary | ICD-10-CM | POA: Diagnosis not present

## 2021-01-05 DIAGNOSIS — R112 Nausea with vomiting, unspecified: Secondary | ICD-10-CM | POA: Diagnosis not present

## 2021-01-05 LAB — COMPREHENSIVE METABOLIC PANEL
ALT: 17 U/L (ref 0–44)
AST: 31 U/L (ref 15–41)
Albumin: 2.9 g/dL — ABNORMAL LOW (ref 3.5–5.0)
Alkaline Phosphatase: 88 U/L (ref 38–126)
Anion gap: 7 (ref 5–15)
BUN: <5 mg/dL — ABNORMAL LOW (ref 8–23)
CO2: 33 mmol/L — ABNORMAL HIGH (ref 22–32)
Calcium: 8.8 mg/dL — ABNORMAL LOW (ref 8.9–10.3)
Chloride: 100 mmol/L (ref 98–111)
Creatinine, Ser: 0.88 mg/dL (ref 0.44–1.00)
GFR, Estimated: >60 mL/min (ref >60)
Glucose, Bld: 92 mg/dL (ref 70–99)
Potassium: 3.9 mmol/L (ref 3.5–5.1)
Sodium: 140 mmol/L (ref 135–145)
Total Bilirubin: 0.4 mg/dL (ref 0.3–1.2)
Total Protein: 5.6 g/dL — ABNORMAL LOW (ref 6.5–8.1)

## 2021-01-05 LAB — MAGNESIUM: Magnesium: 1.2 mg/dL — ABNORMAL LOW (ref 1.7–2.4)

## 2021-01-05 LAB — PHOSPHORUS: Phosphorus: 3.3 mg/dL (ref 2.5–4.6)

## 2021-01-05 MED ORDER — MAGNESIUM SULFATE 2 GM/50ML IV SOLN: 2.0000 g | Freq: Once | INTRAVENOUS | Status: DC

## 2021-01-05 MED ORDER — GUAIFENESIN ER 600 MG PO TB12: 600.0000 mg | ORAL_TABLET | Freq: Two times a day (BID) | ORAL | Status: DC | PRN

## 2021-01-05 MED ORDER — GABAPENTIN 600 MG PO TABS: 600.0000 mg | ORAL_TABLET | Freq: Three times a day (TID) | ORAL | 0 refills | Status: DC

## 2021-01-05 MED ORDER — ENSURE ENLIVE PO LIQD: 237.0000 mL | Freq: Two times a day (BID) | ORAL | 12 refills | Status: DC

## 2021-01-05 MED ORDER — MAGNESIUM SULFATE 4 GM/100ML IV SOLN
4.0000 g | Freq: Once | INTRAVENOUS | Status: AC
  Administered 2021-01-05: 4 g via INTRAVENOUS
  Filled 2021-01-05: qty 100

## 2021-01-05 MED ORDER — THIAMINE HCL 100 MG PO TABS: 100.0000 mg | ORAL_TABLET | Freq: Every day | ORAL | Status: DC

## 2021-01-05 NOTE — Progress Notes (Signed)
This patient has been scheduled for a lab visit at 8:30am on Thurs 7/28, prior to her 9am visit with Dr. Valentina Lucks. At this time, she will have a BMP, mag, and phos to measure levels of mag, phos, and potassium. That way, adjustments to daily supplementation can be made ahead of her hospital follow up with Dr. Thompson Grayer on 8/3.   Ezequiel Essex, MD PGY-2, [place] Medicine Service pager [phone]

## 2021-01-05 NOTE — Progress Notes (Signed)
Daily Rounding Note  01/05/2021, 12:24 PM  LOS: 0 days   SUBJECTIVE:   Chief complaint:   Chronic nausea, intermittent vomiting.  Patient has had nausea "since childhood".  Periodic nonbloody emesis, epigastric discomfort.  Symptoms improved though she's at baseline for her nausea.  Tolerating multiple solid meals.  Stools have gone from watery to soft, formed, orangey brown coloration.  OBJECTIVE:         Vital signs in last 24 hours:    Temp:  [98 F (36.7 C)-98.4 F (36.9 C)] 98 F (36.7 C) (07/25 0835) Pulse Rate:  [89-105] 105 (07/25 0835) Resp:  [18-20] 20 (07/25 0835) BP: (118-141)/(69-92) 141/92 (07/25 0835) SpO2:  [93 %-95 %] 93 % (07/25 0835) Weight:  [87.2 kg] 87.2 kg (07/25 0500)   Filed Weights   01/03/21 0109 01/04/21 0714 01/05/21 0500  Weight: 83.4 kg 79.8 kg 87.2 kg   General: NAD.  Looks tired and a bit older than stated age.  Comfortable, no distress. Heart: RRR Chest: Clear bilaterally without labored breathing or cough Abdomen: Not tender or distended.  Active bowel sounds. Extremities: No CCE Neuro/Psych: Calm, pleasant, fluid speech.  Cooperative.  Not depressed, not agitated  Intake/Output from previous day: 07/24 0701 - 07/25 0700 In: 300 [P.O.:300] Out: 500 [Urine:500]  Intake/Output this shift: Total I/O In: 240 [P.O.:240] Out: -   Lab Results: Recent Labs    01/03/21 0134  WBC 8.7  HGB 14.0  HCT 41.4  PLT 150   BMET Recent Labs    01/03/21 0604 01/03/21 1504 01/05/21 0932  NA 136 134* 140  K 3.6 3.8 3.9  CL 106 101 100  CO2 25 26 33*  GLUCOSE 88 116* 92  BUN <5* 5* <5*  CREATININE 0.58 0.83 0.88  CALCIUM 7.5* 8.3* 8.8*   LFT Recent Labs    01/02/21 1328 01/03/21 1504 01/05/21 0932  PROT 6.2*  --  5.6*  ALBUMIN 3.3* 2.8* 2.9*  AST 34  --  31  ALT 20  --  17  ALKPHOS 91  --  88  BILITOT 0.6  --  0.4  BILIDIR 0.2  --   --   IBILI 0.4  --   --     PT/INR No results for input(s): LABPROT, INR in the last 72 hours. Hepatitis Panel No results for input(s): HEPBSAG, HCVAB, HEPAIGM, HEPBIGM in the last 72 hours.  Studies/Results: US Abdomen Limited RUQ (LIVER/GB)  Result Date: 01/04/2021 CLINICAL DATA:  Epigastric pain EXAM: ULTRASOUND ABDOMEN LIMITED RIGHT UPPER QUADRANT COMPARISON:  CT abdomen pelvis dated 01/02/2021. FINDINGS: Gallbladder: No gallstones or wall thickening visualized. No sonographic Murphy sign noted by sonographer. Common bile duct: Diameter: 4 mm Liver: No focal lesion identified. Increased parenchymal echogenicity. Portal vein is patent on color Doppler imaging with normal direction of blood flow towards the liver. Other: None. IMPRESSION: Increased liver echogenicity is consistent with hepatic steatosis. Electronically Signed   By: Zerita Boers M.D.   On: 01/04/2021 09:47    Scheduled Meds:  clonazePAM  1 mg Oral BID   feeding supplement  237 mL Oral BID BM   gabapentin  1,200 mg Oral TID   hydrOXYzine  25 mg Oral QID   multivitamin with minerals  1 tablet Oral Daily   nicotine  14 mg Transdermal Daily   pantoprazole  40 mg Oral Daily   QUEtiapine  50 mg Oral QHS   thiamine  100 mg Oral Daily  umeclidinium-vilanterol  1 puff Inhalation Daily   Continuous Infusions:  magnesium sulfate bolus IVPB     PRN Meds:.acetaminophen, albuterol, ALPRAZolam, guaiFENesin, prochlorperazine   ASSESMENT:      Cyclic Epigastric pain, diarrhea, nausea/vomiting, weight loss.. 01/04/2021 EGD: 1 cm HH.  Solitary, diminutive gastric erosion otherwise normal stomach, biopsies obtained for H. pylori.  Normal examined duodenum, biopsied. No overt cause for symptoms found at EGD.  Continues on Protonix 40 mg po daily as at home.   ETOH use disorder.  No ETOH since 08/2020.  Fatty liver on CT.  LFTs, Lipase  normal.      SIADH.  Hyponatremia.     PLAN     Await pndg surg path.  No changes to current plans. At discharge  wonder if she should get a prescription for prn antinausea suppositories?,  To have as a backup    Karen Casey  01/05/2021, 12:24 PM Phone [phone]

## 2021-01-05 NOTE — Progress Notes (Signed)
DISCHARGE NOTE HOME Karen Casey to be discharged Home per MD order. Discussed prescriptions and follow up appointments with the patient. Prescriptions given to patient; medication list explained in detail. Patient verbalized understanding.  Skin clean, dry and intact without evidence of skin break down, no evidence of skin tears noted. IV catheter discontinued intact. Site without signs and symptoms of complications. Dressing and pressure applied. Pt denies pain at the site currently. No complaints noted.  Patient free of lines, drains, and wounds.   An After Visit Summary (AVS) was printed and given to the patient. Patient escorted via wheelchair, and discharged home via private auto.  Vira Agar, RN

## 2021-01-05 NOTE — Telephone Encounter (Signed)
-----   Message from Alethia Berthold, [place] sent at 01/05/2021 12:13 PM EDT ----- Regarding: FW: Scheduling Zio patch and stress test Please call patient to let her know she needs monitor to evaluate if she is having any more episodes of A fib.   ----- Message ----- From: Vassie Moselle Sent: 12/25/2020   4:55 PM EDT To: Alethia Berthold, PA-C, # Subject: RE: Scheduling Zio patch and stress test       Done , But patient would like to know why she needs a monitor . If you could please call her and discuss with her when you have a moment .    ----- Message ----- From: Alethia Berthold, PA-C Sent: 12/10/2020   8:44 AM EDT To: [facility] Subject: FW: Scheduling Zio patch and stress test       Please call patient and set up for stress test and monitor.  ----- Message ----- From: [name], MD Sent: 12/09/2020  11:48 AM EDT To: Alethia Berthold, PA-C Subject: Scheduling Zio patch and stress test           Hi Ms Rayetta Pigg,  Thank you so much for your help in seeing my patient Ms Femrite. I saw her today in clinic, and she mentioned she wasn't sure about how to set up the Zio patch monitor or stress test to reschedule. Is that something your office will call her for or does she need to call y'all back to reschedule?  Thanks so much, [employer]

## 2021-01-05 NOTE — Plan of Care (Signed)
  Problem: Education: Goal: Knowledge of General Education information will improve Description: Including pain rating scale, medication(s)/side effects and non-pharmacologic comfort measures Outcome: Progressing   Problem: Health Behavior/Discharge Planning: Goal: Ability to manage health-related needs will improve Outcome: Progressing   Problem: Clinical Measurements: Goal: Ability to maintain clinical measurements within normal limits will improve Outcome: Progressing   Problem: Nutrition: Goal: Adequate nutrition will be maintained Outcome: Progressing   Problem: Coping: Goal: Level of anxiety will decrease Outcome: Progressing   Problem: Safety: Goal: Ability to remain free from injury will improve Outcome: Progressing   

## 2021-01-06 ENCOUNTER — Encounter: Payer: Self-pay | Admitting: Gastroenterology

## 2021-01-06 LAB — SURGICAL PATHOLOGY

## 2021-01-07 ENCOUNTER — Encounter (HOSPITAL_COMMUNITY): Payer: Self-pay | Admitting: Gastroenterology

## 2021-01-08 ENCOUNTER — Other Ambulatory Visit: Payer: Self-pay

## 2021-01-08 ENCOUNTER — Other Ambulatory Visit: Payer: 59

## 2021-01-08 ENCOUNTER — Ambulatory Visit (INDEPENDENT_AMBULATORY_CARE_PROVIDER_SITE_OTHER): Payer: 59 | Admitting: Pharmacist

## 2021-01-08 ENCOUNTER — Encounter: Payer: Self-pay | Admitting: Pharmacist

## 2021-01-08 DIAGNOSIS — E876 Hypokalemia: Secondary | ICD-10-CM

## 2021-01-08 DIAGNOSIS — J449 Chronic obstructive pulmonary disease, unspecified: Secondary | ICD-10-CM

## 2021-01-08 DIAGNOSIS — Z720 Tobacco use: Secondary | ICD-10-CM | POA: Diagnosis not present

## 2021-01-08 DIAGNOSIS — R112 Nausea with vomiting, unspecified: Secondary | ICD-10-CM

## 2021-01-08 DIAGNOSIS — R197 Diarrhea, unspecified: Secondary | ICD-10-CM

## 2021-01-08 MED ORDER — NICOTINE 21 MG/24HR TD PT24: 21.0000 mg | MEDICATED_PATCH | Freq: Every day | TRANSDERMAL | 1 refills | Status: DC

## 2021-01-08 NOTE — Progress Notes (Signed)
Reviewed: I agree with Dr. Koval's documentation and management. 

## 2021-01-08 NOTE — Assessment & Plan Note (Signed)
Patient with chronic tobacco abuse for >40 years has been experiencing shortness of breath, and sputum production, reporting improved breathing since initiation of Anoro Elipta.. Spirometry evaluation reveals moderate obstruction with no pre/post albuterol change indicative of non-reversible obstructive lung disease. Spirometry GOLD Treatment Group B based on CAT score of 27 and minimal exacerbation history.   Chronic tobacco abuse with increased motivation to quit smoking following spirometry which demonstrated lung age of 43 years.  Patient has used nicotine patches in the past while hospitalized and requested a supply of patched to help her quit.  We agreed on starting nicotine '21mg'$  patches. Patient educated on purpose, proper use and potential adverse effects of irritation, insomnia and nightmares.  Following instruction patient verbalized understanding of treatment plan.

## 2021-01-08 NOTE — Patient Instructions (Signed)
Nice to meet you today.   Your lung function test showed your lung have obstruction or COPD.  Your lung age was estimated at 65 years old.   Start nicotine patch '21mg'$  once daily in the morning.  Your goal is to smoke 1 cigarette or less (zero) for the next week.   Follow-up with Dr. Thompson Grayer next week.

## 2021-01-08 NOTE — Progress Notes (Signed)
   S:    Patient arrives in good spirits, states she has been doing better since getting home from the hospital. Presents for lung function evaluation.    Patient was referred and last seen by Primary Care Provider, Dr. Thompson Grayer, on 01/02/21. Patient was recently hospitalized 7/22-7/25 for intractable nausea/vomiting and benzodiazepine withdrawal.   Patient reports breathing has improved since she started taking Anoro. Uses oxygen at night every  now and then. Pulse ox at home is usually 94% but drops when she sleeps. She has been referred for a sleep study.   Medication adherence reported as daily Patient reports last dose of COPD medications was yesterday morning.  Current COPD medications: Anoro 62.5-25 mcg - 1 puff daily Rescue inhaler use frequency: Has not used in last 2 weeks Patient exacerbation hx: Minimal  Tobacco Use: 0.5 ppd pal Mal - Ultra Lights Has used patched while hospitalized in the past.    O: Physical Exam Constitutional:      Appearance: Normal appearance.  Pulmonary:     Effort: Pulmonary effort is normal.  Neurological:     Mental Status: She is alert.    Review of Systems  Respiratory:  Positive for sputum production, shortness of breath and wheezing.   Psychiatric/Behavioral:  The patient is nervous/anxious.    Vitals:   01/08/21 0903  Pulse: 89  SpO2: 95%    CAT score= 27 See Documentation Flowsheet - CAT/COPD for complete symptom scoring.  See "scanned report" or Documentation Flowsheet (discrete results - PFTs) for Spirometry results. Patient provided good effort while attempting spirometry.   Lung Age =  Albuterol Neb  Lot# [id number]     Exp. 02/11/2022   A/P: Patient with chronic tobacco abuse for >40 years has been experiencing shortness of breath, and sputum production, reporting improved breathing since initiation of Anoro Elipta.. Spirometry evaluation reveals moderate obstruction with no pre/post albuterol change indicative of non-reversible  obstructive lung disease. Spirometry GOLD Treatment Group B based on CAT score of 27 and minimal exacerbation history.   Chronic tobacco abuse with increased motivation to quit smoking following spirometry which demonstrated lung age of 24 years.  Patient has used nicotine patches in the past while hospitalized and requested a supply of patched to help her quit.  We agreed on starting nicotine '21mg'$  patches. Patient educated on purpose, proper use and potential adverse effects of irritation, insomnia and nightmares.  Following instruction patient verbalized understanding of treatment plan.   Anxiety - chronic currently taking minimal alprazolam and taking clonazepam only once daily several days over the last week.  She is concerned that she will drink more alcohol before returning to see Dr. Thompson Grayer next week.  I encouraged her to continue her current plan as she has made great progress at reducing her benzodiazepine use.  Consider use of alternate SSRI/SNRI at visit in future to improve success with BZD taper.   -Reviewed results of pulmonary function tests to patient and patient's sister.  Pt verbalized understanding of results and education.    Written pt instructions provided.  F/U Clinic visit with PCP, Dr. Thompson Grayer next week..    Total time in face to face counseling 55 minutes.  Patient seen with Rebbeca Paul, PharmD - PGY2 Pharmacy Resident.

## 2021-01-08 NOTE — Assessment & Plan Note (Signed)
Chronic tobacco abuse with increased motivation to quit smoking following spirometry which demonstrated lung age of 22 years.  Patient has used nicotine patches in the past while hospitalized and requested a supply of patched to help her quit.  We agreed on starting nicotine '21mg'$  patches. Patient educated on purpose, proper use and potential adverse effects of irritation, insomnia and nightmares.  Following instruction patient verbalized understanding of treatment plan.

## 2021-01-09 LAB — BASIC METABOLIC PANEL
BUN/Creatinine Ratio: 8 — ABNORMAL LOW (ref 12–28)
BUN: 6 mg/dL — ABNORMAL LOW (ref 8–27)
CO2: 21 mmol/L (ref 20–29)
Calcium: 9 mg/dL (ref 8.7–10.3)
Chloride: 93 mmol/L — ABNORMAL LOW (ref 96–106)
Creatinine, Ser: 0.76 mg/dL (ref 0.57–1.00)
Glucose: 94 mg/dL (ref 65–99)
Potassium: 4.8 mmol/L (ref 3.5–5.2)
Sodium: 137 mmol/L (ref 134–144)
eGFR: 89 mL/min/{1.73_m2} (ref >59)

## 2021-01-09 LAB — MAGNESIUM: Magnesium: 1.5 mg/dL — ABNORMAL LOW (ref 1.6–2.3)

## 2021-01-09 LAB — PHOSPHORUS: Phosphorus: 4.3 mg/dL (ref 3.0–4.3)

## 2021-01-12 ENCOUNTER — Ambulatory Visit (HOSPITAL_COMMUNITY): Payer: 59

## 2021-01-13 NOTE — Progress Notes (Signed)
    SUBJECTIVE:   CHIEF COMPLAINT / HPI:   Hospital Discharge Follow-up: - Hypokalemia and hypomagnesemia- labs last week with normal K 4.8 and low Magnesium of 1.5, is taking PO magnesium supplementation - Vomiting and diarrhea- CT abd/pelvis negative for acute infection. EGD 01/04/21 with negative pathology with hiatal hernia and otherwise normal, continued on PO Protonix '40mg'$  daily, since going home has not had any more vomiting diarrhea has been eating everything. - Hepatic steatosis- noted on CT abd/pelvis, gust healthy diet changes and that weight loss can resolve this.  Depression/anxiety- on Klonopin '1mg'$  BID and PRN Xanax 0.5 mg. Has 3 Xanax left. Wanting a refill but has not been using daily. Taking the Klonopin 1 mg BID, trying to not use daily.  Feels the Seroquel 50 mg at bedtime has been helping her.  Alcohol use disorder- last drink yesterday had one [employer] hard lemonade. States she can control how much she drinks and does not feel she needs to cut it out completely.  She states she does not think she has trouble stopping drinking  COPD- PFTs with Dr Valentina Lucks 01/08/21 showing Gold group B with spirometry showing moderate obstruction non-reversible. Continue Anoro 62.5-25 mcg - 1 puff daily. Started nicotine patches to help with smoking cessation.  PERTINENT  PMH / PSH: as above  OBJECTIVE:   BP 122/76   Pulse (!) 101   Ht '5\' 3"'$  (1.6 m)   Wt 185 lb 3.2 oz (84 kg)   SpO2 93%   BMI 32.81 kg/m   General: A&O, NAD HEENT: No sign of trauma, EOM grossly intact Cardiac: RRR, no m/r/g Respiratory: CTAB, normal WOB, no w/c/r GI: Soft, NTTP, non-distended  Extremities: NTTP, no peripheral edema. Neuro: Normal gait, moves all four extremities appropriately. Psych: Appropriate mood and affect   ASSESSMENT/PLAN:   Anxiety disorder - Discussed importance of continuing Klonopin 1 mg twice daily to present withdrawal seizure, she is in agreement, refilled her Xanax as she only has 3  left and has been using them every 3 to 4 days, congratulated patient and discussed this would be the one to wean but for now to keep the Klonopin on board  - Increase Seroquel to 100 mg in the evening to help with sleep, consider SSRI/SNRI trial at follow-up appointment when electrolytes have remained stable  Alcohol use - Mrs. Nall remains in the precontemplative phase about her alcohol use, discussed my recommendation for complete cessation of why I feel this is important today, encouraged abstinence from alcohol  Nausea and vomiting - Has resolved today, electrolytes from appointment last week within normal limits and she is no longer on supplementation - EGD without acute abnormality but does show hiatal hernia, continue Protonix daily, has follow-up with GI to discuss colonoscopy for her diarrhea  Diarrhea - Chronic, noninfectious per hospital work-up, has follow-up appoint with GI to discuss further  Chronic obstructive pulmonary disease ([place]) - Well-controlled, continue home inhalers   Phenergan rectal supp 12.5 mg q6hr PRN, no more than 4 in a 24 hour period was sent in to assist patient if she has another episode of nausea and vomiting and is unable to use Zofran ODT tablets.   [name], MD [place]

## 2021-01-14 ENCOUNTER — Other Ambulatory Visit: Payer: Self-pay

## 2021-01-14 ENCOUNTER — Encounter: Payer: Self-pay | Admitting: Family Medicine

## 2021-01-14 ENCOUNTER — Ambulatory Visit (INDEPENDENT_AMBULATORY_CARE_PROVIDER_SITE_OTHER): Payer: 59 | Admitting: Family Medicine

## 2021-01-14 VITALS — BP 122/76 | HR 101 | Ht 63.0 in | Wt 185.2 lb

## 2021-01-14 DIAGNOSIS — F419 Anxiety disorder, unspecified: Secondary | ICD-10-CM

## 2021-01-14 DIAGNOSIS — R112 Nausea with vomiting, unspecified: Secondary | ICD-10-CM | POA: Diagnosis not present

## 2021-01-14 DIAGNOSIS — J449 Chronic obstructive pulmonary disease, unspecified: Secondary | ICD-10-CM

## 2021-01-14 DIAGNOSIS — R918 Other nonspecific abnormal finding of lung field: Secondary | ICD-10-CM

## 2021-01-14 DIAGNOSIS — R197 Diarrhea, unspecified: Secondary | ICD-10-CM

## 2021-01-14 DIAGNOSIS — Z7289 Other problems related to lifestyle: Secondary | ICD-10-CM

## 2021-01-14 DIAGNOSIS — Z789 Other specified health status: Secondary | ICD-10-CM

## 2021-01-14 MED ORDER — ALPRAZOLAM 0.5 MG PO TABS: ORAL_TABLET | ORAL | 0 refills | Status: DC

## 2021-01-14 NOTE — Assessment & Plan Note (Signed)
-   Karen Casey remains in the precontemplative phase about her alcohol use, discussed my recommendation for complete cessation of why I feel this is important today, encouraged abstinence from alcohol

## 2021-01-14 NOTE — Assessment & Plan Note (Signed)
-   Well-controlled, continue home inhalers

## 2021-01-14 NOTE — Assessment & Plan Note (Signed)
-   Discussed importance of continuing Klonopin 1 mg twice daily to present withdrawal seizure, she is in agreement, refilled her Xanax as she only has 3 left and has been using them every 3 to 4 days, congratulated patient and discussed this would be the one to wean but for now to keep the Klonopin on board  - Increase Seroquel to 100 mg in the evening to help with sleep, consider SSRI/SNRI trial at follow-up appointment when electrolytes have remained stable

## 2021-01-14 NOTE — Patient Instructions (Addendum)
It was wonderful to see you today.  Please bring ALL of your medications with you to every visit.   Today we talked about:  - Increase your Seroquel to '100mg'$  (2 tabs at bedtime) - I have reordered your CT scan for your lungs, they will call you to schedule - I will call in to your pharmacy phenergan rectal suppository as needed for nausea and have refilled your Xanax as needed - Please make a follow up appointment in September to check in on your mood - Continue to work on cutting back on cigarette use   Thank you for choosing [facility].   Please call [phone] with any questions about today's appointment.  Please be sure to schedule follow up at the front  desk before you leave today.   Yehuda Savannah, MD  Family Medicine

## 2021-01-14 NOTE — Addendum Note (Signed)
Addended by: Yehuda Savannah E on: 01/14/2021 05:52 PM   Modules accepted: Orders

## 2021-01-14 NOTE — Assessment & Plan Note (Signed)
-   Has resolved today, electrolytes from appointment last week within normal limits and she is no longer on supplementation - EGD without acute abnormality but does show hiatal hernia, continue Protonix daily, has follow-up with GI to discuss colonoscopy for her diarrhea

## 2021-01-14 NOTE — Assessment & Plan Note (Signed)
-   Chronic, noninfectious per hospital work-up, has follow-up appoint with GI to discuss further

## 2021-01-15 ENCOUNTER — Other Ambulatory Visit: Payer: Self-pay | Admitting: Family Medicine

## 2021-01-15 DIAGNOSIS — I480 Paroxysmal atrial fibrillation: Secondary | ICD-10-CM

## 2021-01-16 ENCOUNTER — Other Ambulatory Visit: Payer: Self-pay | Admitting: Family Medicine

## 2021-01-16 ENCOUNTER — Other Ambulatory Visit: Payer: Self-pay

## 2021-01-16 DIAGNOSIS — S22000A Wedge compression fracture of unspecified thoracic vertebra, initial encounter for closed fracture: Secondary | ICD-10-CM

## 2021-01-16 MED ORDER — ONDANSETRON 8 MG PO TBDP: 8.0000 mg | ORAL_TABLET | Freq: Three times a day (TID) | ORAL | 0 refills | Status: DC | PRN

## 2021-01-16 NOTE — Addendum Note (Signed)
Addended by: Talbert Cage L on: 01/16/2021 05:11 PM   Modules accepted: Orders

## 2021-01-16 NOTE — Telephone Encounter (Signed)
Called patient.  Rang without anyone picking up  Will refill small amount of zofran

## 2021-01-16 NOTE — Telephone Encounter (Signed)
Patient calls nurse line and has left 5 VM regarding medication. Patient reports that she is having "stomach issues" and is nauseated. Patient states that she needs this medication as soon as possible so that she can stay out of the hospital.   Please advise.   Talbot Grumbling, RN

## 2021-01-22 ENCOUNTER — Telehealth: Payer: Self-pay | Admitting: Student

## 2021-01-22 ENCOUNTER — Other Ambulatory Visit: Payer: Self-pay | Admitting: Family Medicine

## 2021-01-22 ENCOUNTER — Other Ambulatory Visit: Payer: Self-pay | Admitting: Student

## 2021-01-22 DIAGNOSIS — K219 Gastro-esophageal reflux disease without esophagitis: Secondary | ICD-10-CM

## 2021-01-22 DIAGNOSIS — G4700 Insomnia, unspecified: Secondary | ICD-10-CM

## 2021-01-22 DIAGNOSIS — F32A Depression, unspecified: Secondary | ICD-10-CM

## 2021-01-22 MED ORDER — PANTOPRAZOLE SODIUM 40 MG PO TBEC: 40.0000 mg | DELAYED_RELEASE_TABLET | Freq: Every day | ORAL | 3 refills | Status: DC

## 2021-01-22 MED ORDER — QUETIAPINE FUMARATE 50 MG PO TABS: 50.0000 mg | ORAL_TABLET | Freq: Every evening | ORAL | 1 refills | Status: DC | PRN

## 2021-01-22 NOTE — Telephone Encounter (Signed)
Patient called and canceled her stress test and stated that she did not want to reschedule. She also has to get a monitor put on. Were these both things that are suppose to get done before her next appointment? Should I try to get her to reschedule and then push her follow up appt back.

## 2021-01-22 NOTE — Telephone Encounter (Signed)
Pt called to cancel stress test b/c she's not feeling well & didn't want to reschedule that appt. Pt also asked if she needed a monitor put on, as it had been mentioned in a previous appt per Pt.

## 2021-01-22 NOTE — Telephone Encounter (Signed)
Yes, both of these need to be done before I see her again. If you are having trouble with her you can call her sister as well.

## 2021-01-23 NOTE — Telephone Encounter (Signed)
Called and spoke to pt, pt has rescheduled appointment and will be coming in to get stress test done Monday 01/26/2021 at 8:30pm.

## 2021-01-26 ENCOUNTER — Telehealth: Payer: Self-pay | Admitting: *Deleted

## 2021-01-26 ENCOUNTER — Other Ambulatory Visit: Payer: 59

## 2021-01-26 ENCOUNTER — Telehealth: Payer: Self-pay

## 2021-01-26 NOTE — Telephone Encounter (Signed)
Called patient, no answer. Left HIPAA compliant VM for patient to return call to nurse line to discuss further.   Talbot Grumbling, RN

## 2021-01-26 NOTE — Telephone Encounter (Signed)
-----   Message from Valerie Roys, [place] sent at 01/15/2021  8:48 AM EDT ----- Regarding: RE: CT chest ordered Ok to schedule at [facility] or [place].  It has been approved.    [id number] Kaufmann #  Approval Valid Through: 01/15/2021 - 02/13/2021 ----- Message ----- From: [name], MD Sent: 01/14/2021   5:52 PM EDT To: Valerie Roys, CMA, [facility] Subject: CT chest ordered                               Hi Team,  I have re-ordered this CT chest per insurance recommendations, when it is scheduled can we please call and tell patient date/time?  Thanks! Dr Thompson Grayer

## 2021-01-26 NOTE — Telephone Encounter (Signed)
Patient calls nurse line requesting to speak with Dr. Thompson Grayer regarding back and abdominal pain. Patient is requesting changes in medications. Patient previously scheduled for follow up in September, however, patient reports that she cannot wait this long. Scheduled patient for Friday, August 26th.   Talbot Grumbling, RN

## 2021-01-26 NOTE — Telephone Encounter (Signed)
Patient returns call to nurse line. Patient reports that she has been having "really bad diarrhea." States that she feels weak and that "everything is coming out that I'm putting in."   Advised that patient be further evaluated in UC/ED.   Patient verbalizes understanding.   Talbot Grumbling, RN

## 2021-01-26 NOTE — Telephone Encounter (Signed)
Contacted pt to inform her of her CT appointment.  It is on 02/03/2021 at [facility]  Time is 12:00pm and authorization number is [id number] through 01/15/21-02/13/21. Karen Casey, CMA

## 2021-01-27 ENCOUNTER — Other Ambulatory Visit: Payer: Self-pay | Admitting: Family Medicine

## 2021-01-29 ENCOUNTER — Other Ambulatory Visit: Payer: Self-pay | Admitting: Family Medicine

## 2021-01-29 DIAGNOSIS — F419 Anxiety disorder, unspecified: Secondary | ICD-10-CM

## 2021-02-03 ENCOUNTER — Ambulatory Visit (HOSPITAL_COMMUNITY)
Admission: RE | Admit: 2021-02-03 | Discharge: 2021-02-03 | Disposition: A | Discharge status: Home / Self Care | Payer: 59 | Source: Ambulatory Visit | Attending: Family Medicine | Admitting: Family Medicine

## 2021-02-03 ENCOUNTER — Other Ambulatory Visit: Payer: Self-pay

## 2021-02-03 DIAGNOSIS — R918 Other nonspecific abnormal finding of lung field: Secondary | ICD-10-CM | POA: Insufficient documentation

## 2021-02-04 ENCOUNTER — Ambulatory Visit
Admission: RE | Admit: 2021-02-04 | Discharge: 2021-02-04 | Disposition: A | Discharge status: Home / Self Care | Payer: 59 | Source: Ambulatory Visit | Attending: Family Medicine | Admitting: Family Medicine

## 2021-02-04 ENCOUNTER — Ambulatory Visit: Payer: 59

## 2021-02-04 ENCOUNTER — Ambulatory Visit: Admission: RE | Admit: 2021-02-04 | Payer: 59 | Source: Ambulatory Visit

## 2021-02-04 DIAGNOSIS — G8929 Other chronic pain: Secondary | ICD-10-CM

## 2021-02-06 ENCOUNTER — Ambulatory Visit: Payer: 59 | Admitting: Family Medicine

## 2021-02-11 ENCOUNTER — Other Ambulatory Visit: Payer: 59

## 2021-02-11 ENCOUNTER — Ambulatory Visit (INDEPENDENT_AMBULATORY_CARE_PROVIDER_SITE_OTHER): Payer: 59 | Admitting: Gastroenterology

## 2021-02-11 ENCOUNTER — Other Ambulatory Visit: Payer: Self-pay | Admitting: Family Medicine

## 2021-02-11 ENCOUNTER — Encounter: Payer: Self-pay | Admitting: Gastroenterology

## 2021-02-11 VITALS — BP 133/70 | HR 132 | Ht 63.25 in | Wt 183.0 lb

## 2021-02-11 DIAGNOSIS — R110 Nausea: Secondary | ICD-10-CM

## 2021-02-11 DIAGNOSIS — K529 Noninfective gastroenteritis and colitis, unspecified: Secondary | ICD-10-CM | POA: Diagnosis not present

## 2021-02-11 DIAGNOSIS — R1084 Generalized abdominal pain: Secondary | ICD-10-CM

## 2021-02-11 DIAGNOSIS — J449 Chronic obstructive pulmonary disease, unspecified: Secondary | ICD-10-CM

## 2021-02-11 MED ORDER — METOCLOPRAMIDE HCL 5 MG PO TABS: 5.0000 mg | ORAL_TABLET | Freq: Two times a day (BID) | ORAL | 0 refills | Status: DC | PRN

## 2021-02-11 NOTE — Patient Instructions (Addendum)
If you are age 63 or older, your body mass index should be between 23-30. Your Body mass index is 32.16 kg/m. If this is out of the aforementioned range listed, please consider follow up with your Primary Care Provider.  If you are age 20 or younger, your body mass index should be between 19-25. Your Body mass index is 32.16 kg/m. If this is out of the aformentioned range listed, please consider follow up with your Primary Care Provider.   __________________________________________________________  Your provider has requested that you go to the basement level for lab work before leaving today. Press "B" on the elevator. The lab is located at the first door on the left as you exit the elevator.  Due to recent changes in healthcare laws, you may see the results of your imaging and laboratory studies on MyChart before your provider has had a chance to review them.  We understand that in some cases there may be results that are confusing or concerning to you. Not all laboratory results come back in the same time frame and the provider may be waiting for multiple results in order to interpret others.  Please give [place] 48 hours in order for your provider to thoroughly review all the results before contacting the office for clarification of your results.    The [facility] providers would like to encourage you to use [facility] to communicate with providers for non-urgent requests or questions.  Due to long hold times on the telephone, sending your provider a message by [facility] may be a faster and more efficient way to get a response.  Please allow 48 business hours for a response.  Please remember that this is for non-urgent requests.   You have been scheduled for a colonoscopy. Please follow written instructions given to you at your visit today.  Please pick up your prep supplies at the pharmacy within the next 1-3 days. If you use inhalers (even only as needed), please bring them with you on the day of your  procedure.  It was a pleasure to see you today!  Thank you for trusting me with your gastrointestinal care!

## 2021-02-11 NOTE — Progress Notes (Signed)
[facility] Consult Note:  History: Karen Casey 02/11/2021  Referring provider: [name], MD  Reason for consult/chief complaint: Nausea (Has nausea 24/7 since she was a child) and Diarrhea (Everyday at least 6 episodes usually an hour after eating, no blood, stool looks like Hersey's syrup, has been green. Was in hospital in July with multiple issures.)   Subjective  HPI:  I saw Karen Casey once in May 2019 for an office consult on chronic digestive symptoms.  Details outlined in that note, but briefly many years of abdominal pain and nausea and intermittent reflux symptoms.  She had hospitalization that year for severe diverticulitis with perforation and abscess responded to nonsurgical therapy. She is now seen in follow-up after recent hospitalization, admitted last month for an exacerbation of chronic episodic upper abdominal pain nausea and vomiting.  CT scan without obstruction or other clear cause of symptoms.  Sigmoid diverticulosis without inflammation, fatty liver seen.  Ultrasound without gallstones.  Upper endoscopy by Dr. Havery Moros (report reviewed), essentially unrevealing -duodenal biopsies negative for changes of celiac sprue and gastric biopsies negative for H. pylori. There was suspected to be a functional/cyclic vomiting picture.  Patient reported occasional use of marijuana (regular use of marijuana was also reported when I saw her in 2019).  Notes indicate consideration of TCA, though there seems to been further consideration of the context of the patient's other psychiatric medicine.  Karen Casey is here with her sister today and reports "24/7" nausea since she was a child.  She has been taking Zofran once a day since hospital discharge but has not found it helpful.  She does not think she has used any other antiemetics in the past.  She is no longer using marijuana, but when she did so regularly it apparently helped her nausea significantly. She  has had at least 6 months of diarrhea with several loose nonbloody stools per day, sometimes a syrup like consistency, and often with some urgency soon after meals.  She has had no previous stool testing and is not taking any medicines but wondered if she should try Imodium.  She has fairly generalized abdominal pain most of the time and says this has been the case for years.  Karen Casey drinks alcohol regularly, and is somewhat evasive about the amount, but typically 2 hard seltzers more days than not.  ROS:  Review of Systems  Constitutional:  Positive for fatigue. Negative for appetite change and unexpected weight change.  HENT:  Negative for mouth sores and voice change.   Eyes:  Negative for pain and redness.  Respiratory:  Negative for cough and shortness of breath.   Cardiovascular:  Negative for chest pain and palpitations.  Genitourinary:  Negative for dysuria and hematuria.  Musculoskeletal:  Positive for arthralgias and back pain. Negative for myalgias.  Skin:  Negative for pallor and rash.  Neurological:  Negative for weakness and headaches.  Hematological:  Negative for adenopathy.  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   Seizures [facility])    xanax withdrawl- December 2013   Urinary incontinence 06/09/2012  Past Surgical History: Past Surgical History:  Procedure Laterality Date   APPENDECTOMY     BIOPSY  01/04/2021   Procedure: BIOPSY;  Surgeon: Yetta Flock, MD;  Location: [facility] ENDOSCOPY;  Service: Gastroenterology;;   CATARACT EXTRACTION  06/09/2012   Left eye   ESOPHAGOGASTRODUODENOSCOPY (EGD) WITH PROPOFOL N/A 01/04/2021   Procedure:  ESOPHAGOGASTRODUODENOSCOPY (EGD) WITH PROPOFOL;  Surgeon: Yetta Flock, MD;  Location: [place];  Service: Gastroenterology;  Laterality: N/A;   left shoulder dislocation  Sept 2011     Family History: Family History  Problem Relation Age of Onset   Hypertension Mother    Hyperlipidemia Mother    Aneurysm Mother        Rupture - Cause of death   Heart disease Father        MI - cause of death   Depression Father    Parkinsonism Father    Hypertension Sister    Colon cancer Neg Hx    Esophageal cancer Neg Hx    Rectal cancer Neg Hx    Stomach cancer Neg Hx     Social History: Social History   Socioeconomic History   Marital status: Single    Spouse name: Not on file   Number of children: 1   Years of education: Not on file   Highest education level: Not on file  Occupational History   Not on file  Tobacco Use   Smoking status: Every Day    Packs/day: 0.50    Years: 43.00    Pack years: 21.50    Types: Cigarettes   Smokeless tobacco: Never   Tobacco comments:    Tobacco info given  Vaping Use   Vaping Use: Never used  Substance and Sexual Activity   Alcohol use: Yes    Alcohol/week: 3.0 standard drinks    Types: 3 Cans of beer per week    Comment: 2-3 times week just beer   Drug use: Not Currently    Types: Marijuana, Other-see comments    Comment: Past hx of benzo abuse--xanax   Sexual activity: Not on file  Other Topics Concern   Not on file  Social History Narrative   Not on file   Social Determinants of Health   Financial Resource Strain: Not on file  Food Insecurity: Not on file  Transportation Needs: Not on file  Physical Activity: Not on file  Stress: Not on file  Social Connections: Not on file    Allergies: No Known Allergies  Outpatient Meds: Current Outpatient Medications  Medication Sig Dispense Refill   acetaminophen (TYLENOL) 500 MG tablet Take 1,000 mg by mouth every 6 (six) hours as needed for mild pain.      albuterol (VENTOLIN HFA) 108 (90 Base) MCG/ACT inhaler Inhale 2 puffs into the lungs every 6 (six) hours as needed for wheezing or shortness of breath. 8 g 2   ALPRAZolam (XANAX) 0.5 MG tablet TAKE 1 TABLET BY MOUTH DAILY AS NEEDED FOR ANXIETY 15 tablet 0   amLODipine (NORVASC) 5 MG tablet TAKE 1 TABLET (5 MG TOTAL) BY MOUTH DAILY. 90 tablet 1   clonazePAM (KLONOPIN) 1 MG tablet TAKE 1 TABLET BY MOUTH 2 (TWO) TIMES DAILY FOR 28 DAYS. DO NOT TAKE MORE THAN 2 TABS/DAY 56 tablet 0   feeding supplement (ENSURE ENLIVE / ENSURE PLUS) LIQD Take 237 mLs by mouth 2 (two) times daily between meals. 237 mL 12   gabapentin (NEURONTIN) 600 MG tablet Take 1 tablet (600 mg total) by  mouth 3 (three) times daily. 180 tablet 0   hydrOXYzine (VISTARIL) 25 MG capsule Take 25 mg by mouth 4 (four) times daily.     metoCLOPramide (REGLAN) 5 MG tablet Take 1 tablet (5 mg total) by mouth every 12 (twelve) hours as needed for up to 2 doses for nausea. Take 30-45 minutes before evening and AM doses of bowel preparation solution. 2 tablet 0   Multiple Vitamins-Minerals (CENTRUM SILVER 50+WOMEN PO) Take 1 tablet by mouth daily.     nicotine (NICODERM CQ - DOSED IN MG/24 HOURS) 21 mg/24hr patch Place 1 patch (21 mg total) onto the skin daily. 28 patch 1   NON FORMULARY O2 use as needed     ondansetron (ZOFRAN-ODT) 8 MG disintegrating tablet TAKE 1 TABLET BY MOUTH EVERY 8 HOURS AS NEEDED FOR NAUSEA OR VOMITING. 15 tablet 0   pantoprazole (PROTONIX) 40 MG tablet Take 1 tablet (40 mg total) by mouth daily. 90 tablet 3   QUEtiapine (SEROQUEL) 50 MG tablet Take 1 tablet (50 mg total) by mouth at bedtime as needed. 90 tablet 1   thiamine 100 MG tablet Take 1 tablet (100 mg total) by mouth daily.     ANORO ELLIPTA 62.5-25 MCG/INH AEPB TAKE 1 PUFF BY MOUTH EVERY DAY 60 each 1   No current facility-administered medications for this visit.      ___________________________________________________________________ Objective    Exam:  BP 133/70   Pulse (!) 132   Ht 5' 3.25" (1.607 m)   Wt 183 lb (83 kg)   BMI 32.16 kg/m  Wt Readings from Last 3 Encounters:  02/11/21 183 lb (83 kg)  01/14/21 185 lb 3.2 oz (84 kg)  01/08/21 185 lb 9.6 oz (84.2 kg)    General: Chronically ill-appearing woman, no acute distress.  Sister present for entire visit.  Antalgic gait, gets on exam table slowly and independently but needs to steady herself with a hand on the wall. Eyes: sclera anicteric, no redness ENT: oral mucosa moist without lesions, no cervical or supraclavicular lymphadenopathy CV: RRR without murmur, S1/S2, no JVD, no peripheral edema Resp: clear to auscultation bilaterally, normal RR and effort noted GI: soft, multifocal mild tenderness (more so upper abdomen), with active bowel sounds. No guarding or palpable organomegaly noted. Skin; warm and dry, no rash or jaundice noted Neuro: awake, alert and oriented x 3. Normal gross motor function and fluent speech  Labs:  CBC Latest Ref Rng & Units 01/03/2021 01/02/2021 12/05/2020  WBC 4.0 - 10.5 K/uL 8.7 8.4 7.1  Hemoglobin 12.0 - 15.0 g/dL 14.0 16.5(H) 14.4  Hematocrit 36.0 - 46.0 % 41.4 49.0(H) 43.0  Platelets 150 - 400 K/uL 150 221 269   CMP Latest Ref Rng & Units 01/08/2021 01/05/2021 01/03/2021  Glucose 65 - 99 mg/dL 94 92 116(H)  BUN 8 - 27 mg/dL 6(L) <5(L) 5(L)  Creatinine 0.57 - 1.00 mg/dL 0.76 0.88 0.83  Sodium 134 - 144 mmol/L 137 140 134(L)  Potassium 3.5 - 5.2 mmol/L 4.8 3.9 3.8  Chloride 96 - 106 mmol/L 93(L) 100 101  CO2 20 - 29 mmol/L 21 33(H) 26  Calcium 8.7 - 10.3 mg/dL 9.0 8.8(L) 8.3(L)  Total Protein 6.5 - 8.1 g/dL - 5.6(L) -  Total Bilirubin 0.3 - 1.2 mg/dL - 0.4 -  Alkaline Phos 38 - 126 U/L - 88 -  AST 15 - 41 U/L - 31 -  ALT 0 - 44 U/L - 17 -   No C difficile testing since 09/2017  Radiologic Studies:  CLINICAL DATA:  Acute generalized abdominal pain.   EXAM: CT ABDOMEN AND PELVIS WITH CONTRAST   TECHNIQUE: Multidetector  CT imaging of the abdomen and pelvis was performed using the standard protocol following bolus administration of intravenous contrast.   CONTRAST:  142m OMNIPAQUE IOHEXOL 300 MG/ML  SOLN   COMPARISON:  Oct 12, 2020.   FINDINGS: Lower chest: No acute abnormality.   Hepatobiliary: No gallstones or biliary dilatation is noted. Probable hepatic steatosis.   Pancreas: Unremarkable. No pancreatic ductal dilatation or surrounding inflammatory changes.   Spleen: Normal in size without focal abnormality.   Adrenals/Urinary Tract: Adrenal glands are unremarkable. Kidneys are normal, without renal calculi, focal lesion, or hydronephrosis. Bladder is unremarkable.   Stomach/Bowel: The stomach appears normal. There is no evidence of bowel obstruction or inflammation. Sigmoid diverticulosis is noted.   Vascular/Lymphatic: Aortic atherosclerosis. No enlarged abdominal or pelvic lymph nodes.   Reproductive: Uterus and bilateral adnexa are unremarkable.   Other: No abdominal wall hernia or abnormality. No abdominopelvic ascites.   Musculoskeletal: No acute or significant osseous findings.   IMPRESSION: Probable hepatic steatosis.   Sigmoid diverticulosis without inflammation.   No acute abnormality seen in the abdomen or pelvis.   Aortic Atherosclerosis (ICD10-I70.0).     Electronically Signed   By: JMarijo ConceptionM.D.   On: 01/02/2021 15:57   Assessment: Encounter Diagnoses  Name Primary?   Nausea in adult Yes   Chronic diarrhea    Generalized abdominal pain     Lifelong nausea, suspect functional in nature, probably an element of anxiety contributing.  Generalized abdominal pain for years, reportedly worse last 6 or so months with associated diarrhea.  It should be noted she had similar symptoms reported when seeing Dr. KDeatra Inain the past. Chronic alcohol use, I am concerned it may be somewhat heavier than she is reporting, and it sounds like she may be self-medicating  for issues with chronic anxiety. This could be contributing to diarrhea and also raises concern for EPI.  Fortunately, no visible evidence of chronic pancreatitis on the CT scan, and her weight has been stable.  Stools not described as greasy or floating, speaking against this diagnosis or other causes of malabsorption.  Plan:  Imodium as needed for diarrhea  Stool for C. difficile, ova and parasite and fecal elastase  Colonoscopy.  She was agreeable after discussion of procedure and risks.  The benefits and risks of the planned procedure were described in detail with the patient or (when appropriate) their health care proxy.  Risks were outlined as including, but not limited to, bleeding, infection, perforation, adverse medication reaction leading to cardiac or pulmonary decompensation, pancreatitis (if ERCP).  The limitation of incomplete mucosal visualization was also discussed.  No guarantees or warranties were given.  Patient at increased risk for cardiopulmonary complications of procedure due to medical comorbidities.  We were about to schedule procedure, then patient tells uKoreathat she is on oxygen "as needed" at home.  Presumably this is for COPD though not entirely clear.  This means her procedure must be done in the hospital outpatient endoscopy lab, which makes the schedule challenging due to limited available slots.  We will work on this and contact her when we can determine an available time slot.  40 minutes total time today.  Extensive chart review and in person time required.  Over 50% time spent face-to-face with patient.  HNelida MeuseIII  CC: Referring provider noted above

## 2021-02-12 ENCOUNTER — Other Ambulatory Visit: Payer: Self-pay

## 2021-02-12 DIAGNOSIS — R110 Nausea: Secondary | ICD-10-CM

## 2021-02-12 DIAGNOSIS — R1084 Generalized abdominal pain: Secondary | ICD-10-CM

## 2021-02-12 DIAGNOSIS — K529 Noninfective gastroenteritis and colitis, unspecified: Secondary | ICD-10-CM

## 2021-02-12 MED ORDER — NA SULFATE-K SULFATE-MG SULF 17.5-3.13-1.6 GM/177ML PO SOLN: 1.0000 | Freq: Once | ORAL | 0 refills | Status: AC

## 2021-02-17 ENCOUNTER — Telehealth: Payer: Self-pay

## 2021-02-17 NOTE — Telephone Encounter (Signed)
Patient calls nurse line requesting to speak with PCP. Patient reports her Klonopin prescription is not due for "sometime" and she only has #10 pills left, "maybe #12." Patient has a FU apt with PCP next week. Please advise.

## 2021-02-18 ENCOUNTER — Other Ambulatory Visit: Payer: Self-pay | Admitting: Family Medicine

## 2021-02-18 DIAGNOSIS — F419 Anxiety disorder, unspecified: Secondary | ICD-10-CM

## 2021-02-18 MED ORDER — CLONAZEPAM 1 MG PO TABS: 1.0000 mg | ORAL_TABLET | Freq: Two times a day (BID) | ORAL | 0 refills | Status: DC

## 2021-02-20 ENCOUNTER — Other Ambulatory Visit: Payer: Self-pay | Admitting: Family Medicine

## 2021-02-20 ENCOUNTER — Encounter: Payer: 59 | Admitting: Physical Medicine and Rehabilitation

## 2021-02-23 ENCOUNTER — Other Ambulatory Visit: Payer: 59

## 2021-02-23 ENCOUNTER — Other Ambulatory Visit: Payer: Self-pay

## 2021-02-23 DIAGNOSIS — F419 Anxiety disorder, unspecified: Secondary | ICD-10-CM

## 2021-02-24 ENCOUNTER — Other Ambulatory Visit: Payer: Medicare Other

## 2021-02-24 DIAGNOSIS — R110 Nausea: Secondary | ICD-10-CM

## 2021-02-24 DIAGNOSIS — R1084 Generalized abdominal pain: Secondary | ICD-10-CM

## 2021-02-24 DIAGNOSIS — K529 Noninfective gastroenteritis and colitis, unspecified: Secondary | ICD-10-CM

## 2021-02-26 MED ORDER — ALPRAZOLAM 0.5 MG PO TABS: ORAL_TABLET | ORAL | 0 refills | Status: DC

## 2021-02-27 ENCOUNTER — Other Ambulatory Visit: Payer: Self-pay

## 2021-02-27 ENCOUNTER — Other Ambulatory Visit: Payer: Self-pay | Admitting: Family Medicine

## 2021-02-27 ENCOUNTER — Ambulatory Visit (INDEPENDENT_AMBULATORY_CARE_PROVIDER_SITE_OTHER): Payer: Medicare Other | Admitting: Family Medicine

## 2021-02-27 ENCOUNTER — Encounter: Payer: Self-pay | Admitting: Family Medicine

## 2021-02-27 VITALS — BP 140/79 | HR 104 | Ht 63.0 in | Wt 186.4 lb

## 2021-02-27 DIAGNOSIS — Z720 Tobacco use: Secondary | ICD-10-CM

## 2021-02-27 DIAGNOSIS — Z23 Encounter for immunization: Secondary | ICD-10-CM | POA: Diagnosis present

## 2021-02-27 DIAGNOSIS — E222 Syndrome of inappropriate secretion of antidiuretic hormone: Secondary | ICD-10-CM | POA: Diagnosis not present

## 2021-02-27 DIAGNOSIS — R197 Diarrhea, unspecified: Secondary | ICD-10-CM

## 2021-02-27 DIAGNOSIS — Z789 Other specified health status: Secondary | ICD-10-CM

## 2021-02-27 DIAGNOSIS — F419 Anxiety disorder, unspecified: Secondary | ICD-10-CM | POA: Diagnosis present

## 2021-02-27 DIAGNOSIS — J449 Chronic obstructive pulmonary disease, unspecified: Secondary | ICD-10-CM

## 2021-02-27 DIAGNOSIS — Z7289 Other problems related to lifestyle: Secondary | ICD-10-CM

## 2021-02-27 DIAGNOSIS — F1090 Alcohol use, unspecified, uncomplicated: Secondary | ICD-10-CM

## 2021-02-27 MED ORDER — DULOXETINE HCL 60 MG PO CPEP: 60.0000 mg | ORAL_CAPSULE | Freq: Every day | ORAL | 1 refills | Status: DC

## 2021-02-27 NOTE — Assessment & Plan Note (Signed)
-   following with GI, planning for colonoscopy

## 2021-02-27 NOTE — Assessment & Plan Note (Signed)
-   precontemplative phase, trying to cut back but does not want to quit, offered resources when she desires to quit

## 2021-02-27 NOTE — Assessment & Plan Note (Signed)
-   continue Klonopin '1mg'$  BID and PRN Xanax, as this is much less than her previous doses and is maintaining her from withdrawing or having withdrawal seizures - plan to start Cymbalta '30mg'$  x 1 week, then '60mg'$  daily, and recheck BMP in 2 weeks to monitor sodium - follow up in one month - Denies SI or history of mania, discussed the signs/symptoms of these - eventual my goal is to continue weaning benzodiazepines once anxiety is better controlled, as I would like her to be off of these permanently due to her concurrent drinking and history of seizures

## 2021-02-27 NOTE — Assessment & Plan Note (Signed)
-   still drinking, in pre-contemplative phase, discussed my concerns with alcohol use and benzodiazepine use, discussed risks of seizures, continued to encourage cessation

## 2021-02-27 NOTE — Progress Notes (Signed)
SUBJECTIVE:   CHIEF COMPLAINT / HPI:   COPD GOLD stage B-diagnosed with PFTs in July 2022.  Well-controlled on her Anoro Ellipta.  She notes she has used her albuterol inhaler 3 times this week, denies wheezing, sputum production, fevers or chills.  No chest pain or shortness of breath today.  She is requesting a referral to a pulmonologist.  Alcohol use disorder- Still drinking 2 Mike's hards several times a week, last time yesterday. Helps her to feel calm. Makes her stomach feel better she states. No seizures at home. Does not get intoxicated. Does not feel like she needs to stop drinking.  Tobacco use- still smoking 1/2 ppd, not interested in stopping at this time, has tried to cut back.   Anxiety/depression- still feeling anxious. Taking the Klonopin '1mg'$  BID and has PRN Xanax 0.'5mg'$  for breakthrough, does not use them every day. Would like to try to restart an antidepressant. Discussed the risk of lower sodium if she has SIADH again and risk of seizures. Had previously discussed with her psychiatrist who mentioned she had been on SSRI.SNRIs for years without SIADH and felt it would be safe to try again.   Diarrhea- still having 6ish loose stools per day, no blood. Has seen GI, planning for colonoscopy. Has not been trying immodium but has some at home.   PERTINENT  PMH / PSH: as above  OBJECTIVE:   BP 140/79   Pulse (!) 104   Ht '5\' 3"'$  (1.6 m)   Wt 186 lb 6.4 oz (84.6 kg)   SpO2 98%   BMI 33.02 kg/m   General: A&O, NAD HEENT: No sign of trauma, EOM grossly intact Cardiac: RRR, no m/r/g Respiratory: CTAB, normal WOB, no w/c/r GI: Soft, NTTP, non-distended  Extremities: NTTP, no peripheral edema. Neuro: Normal gait, moves all four extremities appropriately. Psych: Appropriate mood and affect   ASSESSMENT/PLAN:   Chronic obstructive pulmonary disease, group B, by Global Initiative for Chronic Obstructive Lung Disease 2017 classification ([place]) - continue Anora Elipta, pulm  referral per patient preference, no signs of exacerbation today, discussed return precautions  SIADH (syndrome of inappropriate ADH production) ([place]) - previously suspected but unsure if hyponatremia more due to nausea/vomiting and poor dietary intake, will check BMP today, then starting Cymbalta as below, and will come back in 2 weeks for lab only appt to recheck sodium  Alcohol use - still drinking, in pre-contemplative phase, discussed my concerns with alcohol use and benzodiazepine use, discussed risks of seizures, continued to encourage cessation   Anxiety disorder - continue Klonopin '1mg'$  BID and PRN Xanax, as this is much less than her previous doses and is maintaining her from withdrawing or having withdrawal seizures - plan to start Cymbalta '30mg'$  x 1 week, then '60mg'$  daily, and recheck BMP in 2 weeks to monitor sodium - follow up in one month - Denies SI or history of mania, discussed the signs/symptoms of these - eventual my goal is to continue weaning benzodiazepines once anxiety is better controlled, as I would like her to be off of these permanently due to her concurrent drinking and history of seizures  Tobacco abuse - precontemplative phase, trying to cut back but does not want to quit, offered resources when she desires to quit  Diarrhea - following with GI, planning for colonoscopy   Given flu vaccine and Pneumococcal 20 vaccine today.  Discussed Shingles vaccine to go to pharmacy to get. Discussed Tdap, declined today, consider at next visit.  [name],  MD [place]

## 2021-02-27 NOTE — Assessment & Plan Note (Signed)
-   previously suspected but unsure if hyponatremia more due to nausea/vomiting and poor dietary intake, will check BMP today, then starting Cymbalta as below, and will come back in 2 weeks for lab only appt to recheck sodium

## 2021-02-27 NOTE — Assessment & Plan Note (Signed)
-   continue Anora Elipta, pulm referral per patient preference, no signs of exacerbation today, discussed return precautions

## 2021-02-27 NOTE — Patient Instructions (Addendum)
It was wonderful to see you today.  Please bring ALL of your medications with you to every visit.   Today we talked about:  - We will check your sodium today, then start Cymbalta '30mg'$  (1/2 tab once daily) for one week then increase to one tab once daily - We will recheck your sodium on labs in two weeks- if you start feeling confused, light headed, any seizures, or dizziness please call and let me know - You can try immodium for your diarrhea - I have placed a referral to a pulmonologist for you - We gave your flu shot today and your pneumonia shot!   Thank you for choosing [facility].   Please call [phone] with any questions about today's appointment.  Please be sure to schedule follow up at the front  desk before you leave today.   Yehuda Savannah, MD  Family Medicine

## 2021-02-28 LAB — BASIC METABOLIC PANEL
BUN/Creatinine Ratio: 5 — ABNORMAL LOW (ref 12–28)
BUN: 4 mg/dL — ABNORMAL LOW (ref 8–27)
CO2: 29 mmol/L (ref 20–29)
Calcium: 9 mg/dL (ref 8.7–10.3)
Chloride: 96 mmol/L (ref 96–106)
Creatinine, Ser: 0.83 mg/dL (ref 0.57–1.00)
Glucose: 100 mg/dL — ABNORMAL HIGH (ref 65–99)
Potassium: 5.1 mmol/L (ref 3.5–5.2)
Sodium: 145 mmol/L — ABNORMAL HIGH (ref 134–144)
eGFR: 80 mL/min/{1.73_m2} (ref >59)

## 2021-03-02 ENCOUNTER — Telehealth: Payer: Self-pay | Admitting: Family Medicine

## 2021-03-02 DIAGNOSIS — F419 Anxiety disorder, unspecified: Secondary | ICD-10-CM

## 2021-03-02 LAB — GASTROINTESTINAL PATHOGEN PANEL PCR
C. difficile Tox A/B, PCR: NOT DETECTED
Campylobacter, PCR: NOT DETECTED
Cryptosporidium, PCR: NOT DETECTED
E coli (ETEC) LT/ST PCR: NOT DETECTED
E coli (STEC) stx1/stx2, PCR: NOT DETECTED
E coli 0157, PCR: NOT DETECTED
Giardia lamblia, PCR: NOT DETECTED
Norovirus, PCR: NOT DETECTED
Rotavirus A, PCR: NOT DETECTED
Salmonella, PCR: NOT DETECTED
Shigella, PCR: NOT DETECTED

## 2021-03-02 LAB — PANCREATIC ELASTASE, FECAL: Pancreatic Elastase-1, Stool: >500 mcg/g

## 2021-03-02 MED ORDER — DULOXETINE HCL 30 MG PO CPEP: ORAL_CAPSULE | ORAL | 1 refills | Status: DC

## 2021-03-02 NOTE — Telephone Encounter (Signed)
Called and discussed lab results. Sent in 30mg  capsule of Cymbalta.

## 2021-03-03 ENCOUNTER — Other Ambulatory Visit: Payer: Self-pay | Admitting: Family Medicine

## 2021-03-05 ENCOUNTER — Encounter: Payer: 59 | Admitting: Gastroenterology

## 2021-03-09 ENCOUNTER — Telehealth: Payer: Self-pay | Admitting: Gastroenterology

## 2021-03-09 NOTE — Telephone Encounter (Signed)
Inbound call from patient. Have questions about prep instructions and procedure

## 2021-03-09 NOTE — Telephone Encounter (Signed)
Emailed the instructions to the patient per request to [email or web address]. we also went over the instructions over the phone.

## 2021-03-10 ENCOUNTER — Ambulatory Visit: Payer: 59 | Admitting: Student

## 2021-03-11 ENCOUNTER — Encounter (HOSPITAL_COMMUNITY): Payer: Self-pay | Admitting: Gastroenterology

## 2021-03-11 NOTE — Anesthesia Preprocedure Evaluation (Addendum)
Anesthesia Evaluation  Patient identified by MRN, date of birth, ID band Patient awake    Reviewed: Allergy & Precautions, NPO status , Patient's Chart, lab work & pertinent test results  Airway Mallampati: II  TM Distance: >3 FB Neck ROM: Full    Dental   Pulmonary COPD, Current Smoker,    breath sounds clear to auscultation       Cardiovascular hypertension, + dysrhythmias Atrial Fibrillation  Rhythm:Regular Rate:Normal     Neuro/Psych Seizures -,   Neuromuscular disease    GI/Hepatic GERD  Medicated,(+)     substance abuse  alcohol use,   Endo/Other  negative endocrine ROS  Renal/GU negative Renal ROS     Musculoskeletal   Abdominal   Peds  Hematology negative hematology ROS (+)   Anesthesia Other Findings   Reproductive/Obstetrics                            Anesthesia Physical Anesthesia Plan  ASA: 3  Anesthesia Plan: MAC   Post-op Pain Management:    Induction: Intravenous  PONV Risk Score and Plan: 1 and Propofol infusion, Ondansetron and Treatment may vary due to age or medical condition  Airway Management Planned: Natural Airway and Simple Face Mask  Additional Equipment:   Intra-op Plan:   Post-operative Plan:   Informed Consent: I have reviewed the patients History and Physical, chart, labs and discussed the procedure including the risks, benefits and alternatives for the proposed anesthesia with the patient or authorized representative who has indicated his/her understanding and acceptance.       Plan Discussed with:   Anesthesia Plan Comments: ( )       Anesthesia Quick Evaluation

## 2021-03-11 NOTE — Progress Notes (Signed)
DUE TO COVID-19 ONLY ONE VISITOR IS ALLOWED TO COME WITH YOU AND STAY IN THE WAITING ROOM ONLY DURING PRE OP AND PROCEDURE DAY OF SURGERY.   PCP - Dr Victoriano Lain Cardiologist - [place] Cruel, [place]  CT Chest x-ray - 02/03/21 EKG - 01/05/21 Stress Test - n/a ECHO - 09/10/20 Cardiac Cath - n/a  ICD Pacemaker/Loop - n/a  Sleep Study -  n/a  Anesthesia review: Yes  STOP now taking any Aspirin (unless otherwise instructed by your surgeon), Aleve, Naproxen, Ibuprofen, Motrin, Advil, Goody's, BC's, all herbal medications, fish oil, and all vitamins.   Coronavirus Screening Covid test n/a Ambulatory Surgery  Do you have any of the following symptoms:  Cough yes/no: No Fever (>100.51F)  yes/no: No Runny nose yes/no: No Sore throat yes/no: No Difficulty breathing/shortness of breath  yes/no: No  Have you traveled in the last 14 days and where? yes/no: No  Patient verbalized understanding of instructions that were given via phone.

## 2021-03-11 NOTE — Progress Notes (Signed)
Anesthesia Chart Review: Karen Casey  Case: [id number] Date/Time: 03/12/21 1015   Procedure: COLONOSCOPY WITH PROPOFOL   Anesthesia type: Monitor Anesthesia Care   Pre-op diagnosis: Diarrhea   Location: [facility] ROOM 1 / [facility]   Surgeons: Mansouraty, Telford Nab., MD       DISCUSSION: Patient is a 65 year old female scheduled for the above procedure. S/p EGD 01/04/21.   History includes smoking, anxiety, depression, HLD, COPD (Gold Stage B, 2L [place] as needed), HTN, dyspnea, seizure (related to Xanax withdrawal 2013), alcohol abuse, PAF, neuromuscular disorder (not specified).   Patient admitted 09/10/2020 - 09/22/2020 with alcohol withdrawal and acute hypoxic respiratory failure. Seen by cardiology for new onset afib, spontaneously converted to normal sinus rhythm and was discharged on anticoagulation for 4 weeks for thromboembolic prophylaxis--discontinued 12/08/20 at cardiology follow-up due to ongoin ETOH and recent fall.  Admitted 01/02/21-01/05/21 for N/V with hypokalemia and hypomagnesemia, replaced.  CT abdomen pelvis negative for acute intra-abdominal pathology, showed hepatic steatosis and diverticulosis. EGD 01/04/21 (biopsies Duodenum: Benign small bowel mucosa, no villous blunting or increase in intraepithelial lymphocytes, no dysplasia or malignancy; Stomach: Mild reactive gastropathy.  [place]-Starry negative for H. pylori, no intestinal metaplasia, dysplasia, or malignancy). At the time of discharge patient's diarrhea and vomiting had resolved.  Last evaluation 02/27/2021 by Dr. Thompson Grayer. By notes, COPD controlled on [place].  Plan referral to pulmonologist per patient request.  Patient still drinking 2 Mikes' hard lemonade a few times a week, does not get intoxicated and feels she does not need to stop drinking. Smoking 1/2 ppd and not interested in stopping at this time.  Still dealing with anxiety and taking Klonopin 1 mg twice daily and as needed Xanax.  Restarting Cymbalta for  depression.  She notes patient's plan for colonoscopy for ongoing diarrhea evaluation.  Anesthesia team to evaluate on the day of procedure.   VS: Ht 5\' 3"  (1.6 m)   Wt 81.6 kg   BMI 31.89 kg/m  BP Readings from Last 3 Encounters:  02/27/21 140/79  02/11/21 133/70  01/14/21 122/76   Pulse Readings from Last 3 Encounters:  02/27/21 (!) 104  02/11/21 (!) 132  01/14/21 (!) 101     PROVIDERS: [name], MD is PCP  Rex Kras, DO is cardiologist   LABS: Last lab results include: Lab Results  Component Value Date   WBC 8.7 01/03/2021   HGB 14.0 01/03/2021   HCT 41.4 01/03/2021   PLT 150 01/03/2021   GLUCOSE 100 (H) 02/27/2021   ALT 17 01/05/2021   AST 31 01/05/2021   NA 145 (H) 02/27/2021   K 5.1 02/27/2021   CL 96 02/27/2021   CREATININE 0.83 02/27/2021   BUN 4 (L) 02/27/2021   CO2 29 02/27/2021   TSH 2.413 10/12/2020   INR 1.3 (H) 09/25/2020   HGBA1C 5.2 09/17/2020    Spirometry 01/08/2021: FVC 1.76 (56.4%), post 1.82 (58.2%).  FEV1 1.21 (50.1%), post 1.29 (53.7%). Moderate obstruction, no pre/post changes.   IMAGES: CT Chest 02/03/21: IMPRESSION: - Multiple tiny BILATERAL pulmonary nodules, majority of which are stable, with a single new 2 mm subpleural nodule in RIGHT lower lobe. - Resolution of previously identified RIGHT lower lobe nodule/clustered nodular foci. - Multiple old RIGHT rib fractures and old healed sternal fracture. - Compression fractures of thoracic vertebral bodies unchanged. - No new intrathoracic abnormalities. - Aortic Atherosclerosis (ICD10-I70.0).   [place] Abd 01/04/21: IMPRESSION: Increased liver echogenicity is consistent with hepatic steatosis.   CT Abd/pelvis 01/02/21:  IMPRESSION: Probable hepatic steatosis. Sigmoid diverticulosis without inflammation. No acute abnormality seen in the abdomen or pelvis. Aortic Atherosclerosis (ICD10-I70.0).     EKG: 01/02/21: Sinus rhythm Atrial premature complex Low voltage,  precordial leads Borderline prolonged QT interval When compared with ECG of EARLIER SAME DATE No significant change was found Confirmed by Delora Fuel ([id number]) on 01/02/2021 9:45:08 PM   CV: Echocardiogram 09/10/2020: 1. Left ventricular ejection fraction, by estimation, is 60 to 65%. The left ventricle has normal function. The left ventricle has no regional wall motion abnormalities. There is mild left ventricular hypertrophy. Left ventricular diastolic parameters were normal.   2. Right ventricular systolic function is normal. The right ventricular size is normal. There is normal pulmonary artery systolic pressure.   3. The mitral valve is normal in structure. Trivial mitral valve regurgitation. No evidence of mitral stenosis.   4. The aortic valve is tricuspid. Aortic valve regurgitation is not visualized. No aortic stenosis is present.   5. The inferior vena cava is normal in size with greater than 50%  respiratory variability, suggesting right atrial pressure of 3 mmHg.    Past Medical History:  Diagnosis Date   Alcohol abuse    last use 03/09/21, marijuana last 03/09/21   Allergy    Anxiety    Cataract 06/09/2012   Right eye and left eye   COPD (chronic obstructive pulmonary disease) (HCC)    Depression    Dyspnea    uses oxygen 2L via [place] prn   GERD (gastroesophageal reflux disease)    Headache    Hypertension    Neuromuscular disorder ([place])    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   Seizure ([place])    08/2020 per patient   Seizures ([place])    xanax withdrawl- December 2013   Urinary incontinence 06/09/2012    Past Surgical History:  Procedure Laterality Date   APPENDECTOMY     BIOPSY  01/04/2021   Procedure: BIOPSY;  Surgeon: Yetta Flock, MD;  Location: [facility] ENDOSCOPY;  Service: Gastroenterology;;   CATARACT EXTRACTION  06/09/2012   Left eye   ESOPHAGOGASTRODUODENOSCOPY (EGD) WITH PROPOFOL N/A 01/04/2021   Procedure: ESOPHAGOGASTRODUODENOSCOPY  (EGD) WITH PROPOFOL;  Surgeon: Yetta Flock, MD;  Location: [facility];  Service: Gastroenterology;  Laterality: N/A;   left shoulder dislocation  Sept 2011    MEDICATIONS: No current facility-administered medications for this encounter.    acetaminophen (TYLENOL) 325 MG tablet   albuterol (VENTOLIN HFA) 108 (90 Base) MCG/ACT inhaler   ALPRAZolam (XANAX) 0.5 MG tablet   amLODipine (NORVASC) 5 MG tablet   ANORO ELLIPTA 62.5-25 MCG/INH AEPB   CALCIUM-MAGNESIUM-ZINC PO   clonazePAM (KLONOPIN) 1 MG tablet   esomeprazole (NEXIUM) 40 MG capsule   gabapentin (NEURONTIN) 600 MG tablet   hydrOXYzine (VISTARIL) 25 MG capsule   metoCLOPramide (REGLAN) 5 MG tablet   Multiple Vitamins-Minerals (CENTRUM SILVER 50+WOMEN PO)   ondansetron (ZOFRAN-ODT) 8 MG disintegrating tablet   promethazine (PHENERGAN) 12.5 MG suppository   QUEtiapine (SEROQUEL) 50 MG tablet   DULoxetine (CYMBALTA) 30 MG capsule   feeding supplement (ENSURE ENLIVE / ENSURE PLUS) LIQD   nicotine (NICODERM CQ - DOSED IN MG/24 HOURS) 21 mg/24hr patch   NON FORMULARY   pantoprazole (PROTONIX) 40 MG tablet   thiamine 100 MG tablet    Myra Gianotti, PA-C Surgical Short Stay/Anesthesiology [facility] Phone [phone] [facility] Phone [phone] 03/11/2021 4:04 PM

## 2021-03-12 ENCOUNTER — Other Ambulatory Visit: Payer: Self-pay

## 2021-03-12 ENCOUNTER — Ambulatory Visit (HOSPITAL_COMMUNITY)
Admission: RE | Admit: 2021-03-12 | Discharge: 2021-03-12 | Disposition: A | Discharge status: Home / Self Care | Payer: Medicare Other | Attending: Gastroenterology | Admitting: Gastroenterology

## 2021-03-12 ENCOUNTER — Ambulatory Visit (HOSPITAL_COMMUNITY): Payer: Medicare Other | Admitting: Vascular Surgery

## 2021-03-12 ENCOUNTER — Encounter (HOSPITAL_COMMUNITY): Payer: Self-pay | Admitting: Gastroenterology

## 2021-03-12 ENCOUNTER — Encounter (HOSPITAL_COMMUNITY)
Admission: RE | Disposition: A | Discharge status: Home / Self Care | Payer: Self-pay | Source: Home / Self Care | Attending: Gastroenterology

## 2021-03-12 DIAGNOSIS — Z9049 Acquired absence of other specified parts of digestive tract: Secondary | ICD-10-CM | POA: Insufficient documentation

## 2021-03-12 DIAGNOSIS — F32A Depression, unspecified: Secondary | ICD-10-CM | POA: Diagnosis not present

## 2021-03-12 DIAGNOSIS — R1084 Generalized abdominal pain: Secondary | ICD-10-CM | POA: Diagnosis not present

## 2021-03-12 DIAGNOSIS — R21 Rash and other nonspecific skin eruption: Secondary | ICD-10-CM | POA: Insufficient documentation

## 2021-03-12 DIAGNOSIS — J449 Chronic obstructive pulmonary disease, unspecified: Secondary | ICD-10-CM | POA: Insufficient documentation

## 2021-03-12 DIAGNOSIS — F419 Anxiety disorder, unspecified: Secondary | ICD-10-CM | POA: Diagnosis not present

## 2021-03-12 DIAGNOSIS — D123 Benign neoplasm of transverse colon: Secondary | ICD-10-CM | POA: Diagnosis not present

## 2021-03-12 DIAGNOSIS — Z79899 Other long term (current) drug therapy: Secondary | ICD-10-CM | POA: Insufficient documentation

## 2021-03-12 DIAGNOSIS — K529 Noninfective gastroenteritis and colitis, unspecified: Secondary | ICD-10-CM | POA: Insufficient documentation

## 2021-03-12 DIAGNOSIS — I10 Essential (primary) hypertension: Secondary | ICD-10-CM | POA: Diagnosis not present

## 2021-03-12 DIAGNOSIS — Z1211 Encounter for screening for malignant neoplasm of colon: Secondary | ICD-10-CM | POA: Diagnosis not present

## 2021-03-12 DIAGNOSIS — K5730 Diverticulosis of large intestine without perforation or abscess without bleeding: Secondary | ICD-10-CM | POA: Insufficient documentation

## 2021-03-12 DIAGNOSIS — F17210 Nicotine dependence, cigarettes, uncomplicated: Secondary | ICD-10-CM | POA: Insufficient documentation

## 2021-03-12 DIAGNOSIS — R110 Nausea: Secondary | ICD-10-CM

## 2021-03-12 DIAGNOSIS — K641 Second degree hemorrhoids: Secondary | ICD-10-CM | POA: Diagnosis not present

## 2021-03-12 DIAGNOSIS — K56699 Other intestinal obstruction unspecified as to partial versus complete obstruction: Secondary | ICD-10-CM

## 2021-03-12 HISTORY — PX: POLYPECTOMY: SHX5525

## 2021-03-12 HISTORY — DX: Essential (primary) hypertension: I10

## 2021-03-12 HISTORY — DX: Myoneural disorder, unspecified: G70.9

## 2021-03-12 HISTORY — DX: Unspecified convulsions: R56.9

## 2021-03-12 HISTORY — PX: COLONOSCOPY WITH PROPOFOL: SHX5780

## 2021-03-12 HISTORY — DX: Dyspnea, unspecified: R06.00

## 2021-03-12 HISTORY — PX: BIOPSY: SHX5522

## 2021-03-12 HISTORY — DX: Headache, unspecified: R51.9

## 2021-03-12 SURGERY — COLONOSCOPY WITH PROPOFOL
Anesthesia: Monitor Anesthesia Care

## 2021-03-12 MED ORDER — AMISULPRIDE (ANTIEMETIC) 5 MG/2ML IV SOLN
INTRAVENOUS | Status: AC
  Filled 2021-03-12: qty 4

## 2021-03-12 MED ORDER — ONDANSETRON HCL 4 MG/2ML IJ SOLN
INTRAMUSCULAR | Status: DC | PRN
  Administered 2021-03-12: 4 mg via INTRAVENOUS

## 2021-03-12 MED ORDER — SODIUM CHLORIDE 0.9 % IV SOLN: INTRAVENOUS | Status: DC

## 2021-03-12 MED ORDER — LACTATED RINGERS IV SOLN: INTRAVENOUS | Status: DC | PRN

## 2021-03-12 MED ORDER — AMISULPRIDE (ANTIEMETIC) 5 MG/2ML IV SOLN: 10.0000 mg | Freq: Once | INTRAVENOUS | Status: DC

## 2021-03-12 MED ORDER — LACTATED RINGERS IV SOLN: INTRAVENOUS | Status: DC

## 2021-03-12 MED ORDER — PROPOFOL 10 MG/ML IV BOLUS
INTRAVENOUS | Status: DC | PRN
Start: 2021-03-12 — End: 2021-03-12
  Administered 2021-03-12: 20 mg via INTRAVENOUS
  Administered 2021-03-12: 60 mg via INTRAVENOUS

## 2021-03-12 MED ORDER — ONDANSETRON 8 MG PO TBDP: 8.0000 mg | ORAL_TABLET | Freq: Three times a day (TID) | ORAL | 0 refills | Status: DC | PRN

## 2021-03-12 MED ORDER — AMISULPRIDE (ANTIEMETIC) 5 MG/2ML IV SOLN
10.0000 mg | Freq: Once | INTRAVENOUS | Status: AC | PRN
  Administered 2021-03-12: 10 mg via INTRAVENOUS
  Filled 2021-03-12: qty 4

## 2021-03-12 MED ORDER — FENTANYL CITRATE (PF) 100 MCG/2ML IJ SOLN: 25.0000 ug | INTRAMUSCULAR | Status: DC | PRN

## 2021-03-12 MED ORDER — PROPOFOL 500 MG/50ML IV EMUL
INTRAVENOUS | Status: DC | PRN
  Administered 2021-03-12: 125 ug/kg/min via INTRAVENOUS

## 2021-03-12 MED ORDER — LIDOCAINE 2% (20 MG/ML) 5 ML SYRINGE
INTRAMUSCULAR | Status: DC | PRN
  Administered 2021-03-12: 60 mg via INTRAVENOUS

## 2021-03-12 SURGICAL SUPPLY — 22 items

## 2021-03-12 NOTE — H&P (Signed)
GASTROENTEROLOGY PROCEDURE H&P NOTE   Primary Care Physician: [name], MD  HPI: Karen Casey is a 65 y.o. female who presents for Colonoscopy for follow up diarrhea and abdominal pain.  Past Medical History:  Diagnosis Date   Alcohol abuse    last use 03/09/21, marijuana last 03/09/21   Allergy    Anxiety    Cataract 06/09/2012   Right eye and left eye   COPD (chronic obstructive pulmonary disease) (HCC)    Depression    Dyspnea    uses oxygen 2L via [place] prn   GERD (gastroesophageal reflux disease)    Headache    Hypertension    Neuromuscular disorder ([place])    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   Seizure ([place])    08/2020 per patient   Seizures ([place])    xanax withdrawl- December 2013   Urinary incontinence 06/09/2012   Past Surgical History:  Procedure Laterality Date   APPENDECTOMY     BIOPSY  01/04/2021   Procedure: BIOPSY;  Surgeon: Yetta Flock, MD;  Location: [facility] ENDOSCOPY;  Service: Gastroenterology;;   CATARACT EXTRACTION  06/09/2012   Left eye   ESOPHAGOGASTRODUODENOSCOPY (EGD) WITH PROPOFOL N/A 01/04/2021   Procedure: ESOPHAGOGASTRODUODENOSCOPY (EGD) WITH PROPOFOL;  Surgeon: Yetta Flock, MD;  Location: [employer];  Service: Gastroenterology;  Laterality: N/A;   left shoulder dislocation  Sept 2011   No current facility-administered medications for this encounter.   No current facility-administered medications for this encounter. No Known Allergies Family History  Problem Relation Age of Onset   Hypertension Mother    Hyperlipidemia Mother    Aneurysm Mother        Rupture - Cause of death   Heart disease Father        MI - cause of death   Depression Father    Parkinsonism Father    Hypertension Sister    Colon cancer Neg Hx    Esophageal cancer Neg Hx    Rectal cancer Neg Hx    Stomach cancer Neg Hx    Social History   Socioeconomic History   Marital status: Single    Spouse name: Not on file    Number of children: 1   Years of education: Not on file   Highest education level: Not on file  Occupational History   Not on file  Tobacco Use   Smoking status: Every Day    Packs/day: 0.50    Years: 43.00    Pack years: 21.50    Types: Cigarettes   Smokeless tobacco: Never   Tobacco comments:    Tobacco info given  Vaping Use   Vaping Use: Never used  Substance and Sexual Activity   Alcohol use: Yes    Alcohol/week: 3.0 standard drinks    Types: 3 Cans of beer per week    Comment: 2-3 times week just beer   Drug use: Not Currently    Types: Marijuana, Other-see comments    Comment: Past hx of benzo abuse--xanax   Sexual activity: Not on file  Other Topics Concern   Not on file  Social History Narrative   Not on file   Social Determinants of Health   Financial Resource Strain: Not on file  Food Insecurity: Not on file  Transportation Needs: Not on file  Physical Activity: Not on file  Stress: Not on file  Social Connections: Not on file  Intimate Partner Violence: Not on file  Physical Exam: Today's Vitals   03/11/21 1210  Weight: 81.6 kg  Height: 5\' 3"  (1.6 m)   Body mass index is 31.89 kg/m. GEN: NAD EYE: Sclerae anicteric ENT: MMM CV: Non-tachycardic GI: Soft, NT/ND NEURO:  Alert & Oriented x 3  Lab Results: No results for input(s): WBC, HGB, HCT, PLT in the last 72 hours. BMET No results for input(s): NA, K, CL, CO2, GLUCOSE, BUN, CREATININE, CALCIUM in the last 72 hours. LFT No results for input(s): PROT, ALBUMIN, AST, ALT, ALKPHOS, BILITOT, BILIDIR, IBILI in the last 72 hours. PT/INR No results for input(s): LABPROT, INR in the last 72 hours.   Impression / Plan: This is a 65 y.o.female who presents for Colonoscopy for follow up diarrhea and abdominal pain.  The risks and benefits of endoscopic evaluation/treatment were discussed with the patient and/or family; these include but are not limited to the risk of perforation, infection,  bleeding, missed lesions, lack of diagnosis, severe illness requiring hospitalization, as well as anesthesia and sedation related illnesses.  The patient's history has been reviewed, patient examined, no change in status, and deemed stable for procedure.  The patient and/or family is agreeable to proceed.    Justice Britain, MD [facility] Advanced [facility]oscopy Office # [phone]

## 2021-03-12 NOTE — Transfer of Care (Signed)
Immediate Anesthesia Transfer of Care Note  Patient: Karen Casey  Procedure(s) Performed: COLONOSCOPY WITH PROPOFOL BIOPSY POLYPECTOMY  Patient Location: PACU  Anesthesia Type:MAC  Level of Consciousness: awake and alert   Airway & Oxygen Therapy: Patient Spontanous Breathing  Post-op Assessment: Report given to RN and Post -op Vital signs reviewed and stable  Post vital signs: Reviewed and stable  Last Vitals:  Vitals Value Taken Time  BP    Temp    Pulse 85 03/12/21 1107  Resp 18 03/12/21 1108  SpO2 99 % 03/12/21 1107  Vitals shown include unvalidated device data.  Last Pain:  Vitals:   03/12/21 0935  TempSrc: Temporal  PainSc: 4          Complications: No notable events documented.

## 2021-03-12 NOTE — Op Note (Signed)
[facility] Patient Name: Karen Casey Procedure Date : 03/12/2021 MRN: [id number] Attending MD: Justice Britain , MD Date of Birth: 1956/05/08 CSN: [id number] Age: 65 Admit Type: Outpatient Procedure:                Colonoscopy Indications:              Screening for colorectal malignant neoplasm,                            Incidental - Generalized abdominal pain, Incidental                            - Chronic diarrhea Providers:                Justice Britain, MD, Burtis Junes, RN, Hinton Dyer Referring MD:             Estill Cotta. Loletha Carrow, MD, [name] Medicines:                Monitored Anesthesia Care Complications:            No immediate complications. Estimated Blood Loss:     Estimated blood loss was minimal. Procedure:                Pre-Anesthesia Assessment:                           - Prior to the procedure, a History and Physical                            was performed, and patient medications and                            allergies were reviewed. The patient's tolerance of                            previous anesthesia was also reviewed. The risks                            and benefits of the procedure and the sedation                            options and risks were discussed with the patient.                            All questions were answered, and informed consent                            was obtained. Prior Anticoagulants: The patient has                            taken no previous anticoagulant or antiplatelet                            agents. ASA Grade Assessment: III - A patient with  severe systemic disease. After reviewing the risks                            and benefits, the patient was deemed in                            satisfactory condition to undergo the procedure.                           After obtaining informed consent, the colonoscope                            was passed under direct  vision. Throughout the                            procedure, the patient's blood pressure, pulse, and                            oxygen saturations were monitored continuously. The                            CF-HQ190L ([id number]) Olympus coloscope was                            introduced through the anus and advanced to the the                            cecum, identified by appendiceal orifice and                            ileocecal valve. The PCF-190TL ([id number]) Olympus                            colonoscope was introduced through the and advanced                            to the. The PCF-H190TL ([id number]) Olympus slim                            colonoscope was introduced through the and advanced                            to the. The patient tolerated the procedure. The                            quality of the bowel preparation was adequate. The                            ileocecal valve, appendiceal orifice, and rectum                            were photographed. The colonoscopy was extremely  difficult due to restricted mobility of the colon,                            a redundant colon and a tortuous colon. Successful                            completion of the procedure was aided by changing                            the patient's position to supine then extreme left                            lateral (near prone) and then right lateral and                            back to supine and subsequent near prone                            positioning, using manual pressure, withdrawing and                            reinserting the scope, withdrawing the scope and                            replacing with the pediatric colonoscope and then                            the ultraslim colonoscope, straightening and                            shortening the scope to obtain bowel loop reduction                            and using scope torsion. Scope In: 10:06:00  AM Scope Out: 02:11:15 AM Scope Withdrawal Time: 0 hours 16 minutes 19 seconds  Total Procedure Duration: 0 hours 50 minutes 51 seconds  Findings:      The perianal exam findings include a perianal rash.      The digital rectal exam findings include hemorrhoids. Pertinent       negatives include no palpable rectal lesions.      A benign-appearing, intrinsic severe stenosis and restricted mobility       measuring 9 mm (inner diameter) was found in the recto-sigmoid colon and       was traversed as noted above after switching from the adult to pediatric       to ultraslim colonoscope with maneuvers as described above as well.      A 10 mm polyp was found in the transverse colon. The polyp was       semi-sessile. The polyp was removed with a cold snare. Resection and       retrieval were complete.      Multiple small-mouthed diverticula were found in the recto-sigmoid colon       and sigmoid colon.      Normal mucosa was found in the entire colon. Biopsies for histology were  taken with a cold forceps from the entire colon for evaluation of       microscopic colitis.      Non-bleeding non-thrombosed external and internal hemorrhoids were found       during retroflexion, during perianal exam and during digital exam. The       hemorrhoids were Grade II (internal hemorrhoids that prolapse but reduce       spontaneously). Impression:               - Perianal rash found on perianal exam. Hemorrhoids                            found on digital rectal exam.                           - Stenosis with significant restricted mobility                            noted in the recto-sigmoid colon and traversed as                            noted above only with ultraslim and with                            significant repositioning.                           - One 10 mm polyp in the transverse colon, removed                            with a cold snare. Resected and retrieved.                            - Diverticulosis in the recto-sigmoid colon and in                            the sigmoid colon.                           - Normal mucosa in the entire examined colon.                            Biopsied.                           - Non-bleeding non-thrombosed external and internal                            hemorrhoids. Recommendation:           - The patient will be observed post-procedure,                            until all discharge criteria are met.                           - Discharge patient to home.                           -  Patient has a contact number available for                            emergencies. The signs and symptoms of potential                            delayed complications were discussed with the                            patient. Return to normal activities tomorrow.                            Written discharge instructions were provided to the                            patient.                           - Resume previous diet.                           - Continue present medications.                           - Await pathology results.                           - Repeat colonoscopy in 3 years for surveillance -                            recommend patient be done in hospital-based                            outpatient setting with CO2 and with Ultraslim                            colonscope with primary GI.                           - Follow up in clinic with Dr. Loletha Carrow.                           - The findings and recommendations were discussed                            with the patient.                           - The findings and recommendations were discussed                            with the patient's family. Procedure Code(s):        --- Professional ---                           [phone], Colonoscopy, flexible; with removal of  tumor(s), polyp(s), or other lesion(s) by snare                            technique                            [id number], 73, Colonoscopy, flexible; with biopsy,                            single or multiple Diagnosis Code(s):        --- Professional ---                           Z12.11, Encounter for screening for malignant                            neoplasm of colon                           K64.1, Second degree hemorrhoids                           K56.699, Other intestinal obstruction unspecified                            as to partial versus complete obstruction                           K63.5, Polyp of colon                           K57.30, Diverticulosis of large intestine without                            perforation or abscess without bleeding CPT copyright 2019 American Medical Association. All rights reserved. The codes documented in this report are preliminary and upon coder review may  be revised to meet current compliance requirements. Justice Britain, MD 03/12/2021 11:20:26 AM Number of Addenda: 0

## 2021-03-13 ENCOUNTER — Encounter: Payer: Self-pay | Admitting: Gastroenterology

## 2021-03-13 ENCOUNTER — Encounter (HOSPITAL_COMMUNITY): Payer: Self-pay | Admitting: Gastroenterology

## 2021-03-13 ENCOUNTER — Telehealth: Payer: Self-pay | Admitting: Gastroenterology

## 2021-03-13 LAB — SURGICAL PATHOLOGY

## 2021-03-13 NOTE — Telephone Encounter (Signed)
Returned call to pt after speaking with [facility]. Pt will need to wait 8 days before insurance will cover a new prescription for Zofran because patient had rx filled on 03/06/21.

## 2021-03-13 NOTE — Telephone Encounter (Signed)
Patient called states she is having issues with the Zofran medication sent to [facility] did not provide any other information.

## 2021-03-13 NOTE — Anesthesia Postprocedure Evaluation (Signed)
Anesthesia Post Note  Patient: Karen Casey  Procedure(s) Performed: COLONOSCOPY WITH PROPOFOL BIOPSY POLYPECTOMY     Patient location during evaluation: PACU Anesthesia Type: MAC Level of consciousness: awake and alert Pain management: pain level controlled Vital Signs Assessment: post-procedure vital signs reviewed and stable Respiratory status: spontaneous breathing, nonlabored ventilation, respiratory function stable and patient connected to nasal cannula oxygen Cardiovascular status: stable and blood pressure returned to baseline Postop Assessment: no apparent nausea or vomiting Anesthetic complications: no   No notable events documented.  Last Vitals:  Vitals:   03/12/21 1130 03/12/21 1145  BP: (!) 147/88 (!) 158/91  Pulse: 87 86  Resp: 18 15  Temp:    SpO2: 96% 91%    Last Pain:  Vitals:   03/12/21 1105  TempSrc:   PainSc: 0-No pain                 Tiajuana Amass

## 2021-03-15 ENCOUNTER — Other Ambulatory Visit: Payer: Self-pay | Admitting: Family Medicine

## 2021-03-15 ENCOUNTER — Other Ambulatory Visit: Payer: Self-pay | Admitting: Student

## 2021-03-15 DIAGNOSIS — I480 Paroxysmal atrial fibrillation: Secondary | ICD-10-CM

## 2021-03-18 ENCOUNTER — Telehealth: Payer: Self-pay

## 2021-03-18 ENCOUNTER — Telehealth: Payer: Self-pay | Admitting: Gastroenterology

## 2021-03-18 NOTE — Telephone Encounter (Signed)
Patient calls nurse line requesting something for nausea besides Zofran. Patient reports she had a colonoscopy done on 9/29 and has been nauseous ever since. Patient denies any vomiting or has noticed any blood in her stool. Patient has reached out to her GI provider, however they have not responded back to her. Patient asks I reach out to PCP.   Patient advised to stay well hydrated and strict ED precautions given.   Of note patient has taken all of the zofran prescribed on 9/29.

## 2021-03-18 NOTE — Telephone Encounter (Signed)
Inbound call from pt requesting a call back stating that she is feeling very nauseous and wanted to know if something else can be called in for her other than Zofran. Please advise. Thank you.

## 2021-03-18 NOTE — Telephone Encounter (Signed)
Dr Rush Landmark  & Dr Loletha Carrow  Returned call to pt this afternoon. Pt has not been on Zofran as her insurance would not cover due to being filled to soon. I explained this to her on 03/13/21 Her insurance will only pay for 18 tablets every 21 days. Pt would have to pay out of pocket until 03/28/21 because last fill was 03/06/21.. Patient is now requesting something different because" Zofran didn't help anyway's." Patient states that she is having increased diarrhea and everything she eats makes her nauseous. Please advise.

## 2021-03-19 MED ORDER — PROMETHAZINE HCL 12.5 MG PO TABS: 12.5000 mg | ORAL_TABLET | Freq: Three times a day (TID) | ORAL | 0 refills | Status: DC | PRN

## 2021-03-19 NOTE — Telephone Encounter (Signed)
Called patient. She is feeling slightly better. She has not yet picked up new Rx from gastroenterology (phenergan). Return precautions given.  All questions answered.   Dorris Singh, MD  Family Medicine Teaching Service

## 2021-03-19 NOTE — Telephone Encounter (Signed)
Appreciate the help and agree.  Of note, these are long-standing symptoms for this patient, and they have been refractory to multiple medicines.  She also needs to become completely abstinent from alcohol, which will be readdressed at the next visit with me.  - HD

## 2021-03-19 NOTE — Telephone Encounter (Signed)
Patient has been informed that Phenergan 12.5mg   every 8hrs has been sent to her pharmacy. Pt will keep follow-up appt in 11/22 with Dr Loletha Carrow and will let Dr. Loletha Carrow know if she has any further issues.

## 2021-03-19 NOTE — Telephone Encounter (Signed)
Rovonda, She can have Phenergan 12.5 mg every 8 hour as needed if she is not using Zofran. 21 tablets total and hopefully this will stave things off until her next day that she can get Zofran. Will let Dr. Loletha Carrow decide how to approach things with her in the future. Thanks. GM

## 2021-03-20 ENCOUNTER — Other Ambulatory Visit: Payer: Self-pay | Admitting: Family Medicine

## 2021-03-23 NOTE — Progress Notes (Signed)
SUBJECTIVE:   CHIEF COMPLAINT / HPI:   Nausea - Using Phenergan from GI, not helping much. Has follow up appt with them. Still drinking about two Mike's hard lemonade every other day, makes stomach feel better. Taking PPI.  Diarrhea- had colonoscopy with GI, colon biopsy with negative histology, one tubular adenoma removed. Has tried imodium but not helping.  Depression- Haven't taken Cymbalta prescribed at last visit, but believes she picked it up.  Anxiety- using Klonopin 1mg  BID, discussed will no longer refill PRN Xanax as we need to start the Cymbalta and will start a wean of the Klonopin 1mg  BID after being on Cymbalta for one month. She states Klonopin doesn't do anything, only Xanax helps. "I will end up back in the hospital with a panic attack." Offered counseling which she is open to and not doing anymore currently. She denies SI. When discussed that I will no longer be refilling Xanax she stated "I think this visit is over."  Alcohol use disorder- she is still drinking 2 Mike's hard lemonade every other day. States it helps her anxiety and her stomach.   PERTINENT  PMH / PSH:  as above  OBJECTIVE:   BP 137/84   Pulse (!) 124   Ht 5\' 3"  (1.6 m)   Wt 181 lb 3.2 oz (82.2 kg)   SpO2 94%   BMI 32.10 kg/m   General: A&O, NAD HEENT: No sign of trauma, EOM grossly intact Respiratory: normal WOB GI: Soft, NTTP, non-distended, no guarding or rebound Neuro: antalgic gait, moves all four extremities appropriately. Psych: appears anxious   ASSESSMENT/PLAN:   GERD (gastroesophageal reflux disease) - following with GI, continue PPI, discussed complete abstinence from alcohol and how alcohol can worsen symptoms of GERD  Alcohol use - Ms Gulick is still drinking and notes that she does not think she needs to stop, discussed risk with GERD and recurrent seizures, she is in precontemplative stage and I do believe she uses this to cope with her anxiety - I did explain with her  continued drinking to her and her sister why I cannot continue to prescribe Xanax with alcohol use and that I will continue to taper Klonopin starting next visit once she is on her Cymbalta  Anxiety disorder - Has not yet started the Cymbalta, refill sent to pharmacy, plan for 1 week 30mg  daily then increase to 60 mg daily - discussed with continued alcohol use and long term risk of benzos will not longer refill Xanax PRN and will plan once on the Cymbalta to continue Klonopin wean by 0.5mg  every 4 weeks, will start in 4 weeks once on Cymbalta but discussed even if she chooses to not start Cymbalta will continue this wean - we did discuss risks of hyponatremia and seizures if she has SIADH with the Cymbalta, she is still wanting to try this and discussed needs to come in for BMP in 2 weeks after starting, discussed with sister if she is showing signs of confusion or lethargy these would also be reasons to go to ED - continue Seroquel 100mg  qHS to help with sleep and mood - she is requesting a new psychiatrist who might consider long term benzodiazepine prescription, CCM referral to assist with both counseling as she was open to this and psychiatry referral was placed, but I again explained with her continued alcohol use the risks of benzodiazepines and why we need to continue her taper, she voiced understanding but then asked "I think its time for  this visit to end"  Diarrhea - continue immodium, colonoscopy without signs of pathology, continues to follow with GI, recommended alcohol abstinence    COVID booster today. Declined Tdap booster.  At next visit need to discuss IV Reclast for history of compression fracture- patient elected to end visit today before we could discuss once I stated Xanax would not be refilled.  [name], MD [facility]

## 2021-03-24 ENCOUNTER — Ambulatory Visit (INDEPENDENT_AMBULATORY_CARE_PROVIDER_SITE_OTHER): Payer: 59

## 2021-03-24 ENCOUNTER — Other Ambulatory Visit: Payer: Self-pay

## 2021-03-24 ENCOUNTER — Ambulatory Visit (INDEPENDENT_AMBULATORY_CARE_PROVIDER_SITE_OTHER): Payer: 59 | Admitting: Family Medicine

## 2021-03-24 ENCOUNTER — Encounter: Payer: Self-pay | Admitting: Family Medicine

## 2021-03-24 VITALS — BP 137/84 | HR 124 | Ht 63.0 in | Wt 181.2 lb

## 2021-03-24 DIAGNOSIS — F419 Anxiety disorder, unspecified: Secondary | ICD-10-CM | POA: Diagnosis not present

## 2021-03-24 DIAGNOSIS — S22000A Wedge compression fracture of unspecified thoracic vertebra, initial encounter for closed fracture: Secondary | ICD-10-CM

## 2021-03-24 DIAGNOSIS — E222 Syndrome of inappropriate secretion of antidiuretic hormone: Secondary | ICD-10-CM | POA: Diagnosis not present

## 2021-03-24 DIAGNOSIS — R197 Diarrhea, unspecified: Secondary | ICD-10-CM

## 2021-03-24 DIAGNOSIS — Z23 Encounter for immunization: Secondary | ICD-10-CM | POA: Diagnosis not present

## 2021-03-24 DIAGNOSIS — Z789 Other specified health status: Secondary | ICD-10-CM | POA: Diagnosis not present

## 2021-03-24 DIAGNOSIS — K219 Gastro-esophageal reflux disease without esophagitis: Secondary | ICD-10-CM

## 2021-03-24 DIAGNOSIS — F1090 Alcohol use, unspecified, uncomplicated: Secondary | ICD-10-CM

## 2021-03-24 MED ORDER — DULOXETINE HCL 30 MG PO CPEP: ORAL_CAPSULE | ORAL | 1 refills | Status: DC

## 2021-03-24 MED ORDER — HYDROXYZINE PAMOATE 25 MG PO CAPS: 25.0000 mg | ORAL_CAPSULE | Freq: Four times a day (QID) | ORAL | 0 refills | Status: DC | PRN

## 2021-03-24 MED ORDER — CLONAZEPAM 1 MG PO TABS: 1.0000 mg | ORAL_TABLET | Freq: Two times a day (BID) | ORAL | 0 refills | Status: DC

## 2021-03-24 NOTE — Patient Instructions (Addendum)
It was wonderful to see you today.  Please bring ALL of your medications with you to every visit.   Today we talked about:  - We have discontinued the Xanax, will continue the Klonopin 1mg  BID while restarting Cymbalta - start and 30 mg daily x 1 week and then increase to 60mg  daily (2 tabs) - Please come in 2 weeks to recheck your sodium, if any signs of confusion or lethargy or seizure please go to ED - Please make follow-up in 4 weeks, we will see how Cymbalta is going and plan to wean Klonopin further at next visit as discussed today   Thank you for choosing [facility].   Please call [phone] with any questions about today's appointment.  Please be sure to schedule follow up at the front  desk before you leave today.   Yehuda [place], MD  Family Medicine

## 2021-03-26 ENCOUNTER — Telehealth: Payer: Self-pay | Admitting: *Deleted

## 2021-03-26 ENCOUNTER — Other Ambulatory Visit: Payer: Self-pay | Admitting: Family Medicine

## 2021-03-26 NOTE — Assessment & Plan Note (Addendum)
-   Has not yet started the Cymbalta, refill sent to pharmacy, plan for 1 week 30mg  daily then increase to 60 mg daily - discussed with continued alcohol use and long term risk of benzos will not longer refill Xanax PRN and will plan once on the Cymbalta to continue Klonopin wean by 0.5mg  every 4 weeks, will start in 4 weeks once on Cymbalta but discussed even if she chooses to not start Cymbalta will continue this wean - we did discuss risks of hyponatremia and seizures if she has SIADH with the Cymbalta, she is still wanting to try this and discussed needs to come in for BMP in 2 weeks after starting, discussed with sister if she is showing signs of confusion or lethargy these would also be reasons to go to ED - continue Seroquel 100mg  qHS to help with sleep and mood - she is requesting a new psychiatrist who might consider long term benzodiazepine prescription, CCM referral to assist with both counseling as she was open to this and psychiatry referral was placed, but I again explained with her continued alcohol use the risks of benzodiazepines and why we need to continue her taper, she voiced understanding but then asked "I think its time for this visit to end"

## 2021-03-26 NOTE — Assessment & Plan Note (Signed)
-   Karen Casey is still drinking and notes that she does not think she needs to stop, discussed risk with GERD and recurrent seizures, she is in precontemplative stage and I do believe she uses this to cope with her anxiety - I did explain with her continued drinking to her and her sister why I cannot continue to prescribe Xanax with alcohol use and that I will continue to taper Klonopin starting next visit once she is on her Cymbalta

## 2021-03-26 NOTE — Assessment & Plan Note (Signed)
-   continue immodium, colonoscopy without signs of pathology, continues to follow with GI, recommended alcohol abstinence

## 2021-03-26 NOTE — Assessment & Plan Note (Signed)
-   following with GI, continue PPI, discussed complete abstinence from alcohol and how alcohol can worsen symptoms of GERD

## 2021-03-26 NOTE — Chronic Care Management (AMB) (Signed)
  Care Management   Note  03/26/2021 Name: Karen Casey MRN: [id number] DOB: [birth year]  Karen Casey is a 65 y.o. year old female who is a primary care patient of Pray, Norwood Levo, MD. I reached out to Karen Casey by phone today in response to a referral sent by [name]'s primary care provider.   [name] given information about care management services today including:  Care management services include personalized support from designated clinical staff supervised by her physician, including individualized plan of care and coordination with other care providers 24/7 contact phone numbers for assistance for urgent and routine care needs. The patient may stop care management services at any time by phone call to the office staff.  Patient did not agree to enrollment in care management services and does not wish to consider at this time.  Follow up plan: Patient declines engagement by the care management team. Appropriate care team members and provider have been notified via electronic communication.  The care management team is available to follow up with the patient after provider conversation with the patient regarding recommendation for care management engagement and subsequent re-referral to the care management team.   [facility]  Direct Dial: [phone]

## 2021-04-02 ENCOUNTER — Other Ambulatory Visit: Payer: Self-pay | Admitting: Gastroenterology

## 2021-04-02 ENCOUNTER — Other Ambulatory Visit: Payer: Self-pay | Admitting: Student

## 2021-04-02 ENCOUNTER — Other Ambulatory Visit: Payer: Self-pay | Admitting: Family Medicine

## 2021-04-02 DIAGNOSIS — J449 Chronic obstructive pulmonary disease, unspecified: Secondary | ICD-10-CM

## 2021-04-02 DIAGNOSIS — I480 Paroxysmal atrial fibrillation: Secondary | ICD-10-CM

## 2021-04-03 ENCOUNTER — Other Ambulatory Visit: Payer: Self-pay | Admitting: Family Medicine

## 2021-04-03 DIAGNOSIS — J449 Chronic obstructive pulmonary disease, unspecified: Secondary | ICD-10-CM

## 2021-04-03 MED ORDER — UMECLIDINIUM-VILANTEROL 62.5-25 MCG/ACT IN AEPB: INHALATION_SPRAY | RESPIRATORY_TRACT | 1 refills | Status: DC

## 2021-04-14 ENCOUNTER — Other Ambulatory Visit: Payer: Self-pay | Admitting: Family Medicine

## 2021-04-14 ENCOUNTER — Ambulatory Visit (INDEPENDENT_AMBULATORY_CARE_PROVIDER_SITE_OTHER): Payer: 59 | Admitting: Gastroenterology

## 2021-04-14 ENCOUNTER — Encounter: Payer: Self-pay | Admitting: Gastroenterology

## 2021-04-14 VITALS — BP 140/86 | HR 63 | Ht 63.0 in | Wt 177.4 lb

## 2021-04-14 DIAGNOSIS — R112 Nausea with vomiting, unspecified: Secondary | ICD-10-CM

## 2021-04-14 DIAGNOSIS — R1084 Generalized abdominal pain: Secondary | ICD-10-CM

## 2021-04-14 DIAGNOSIS — K529 Noninfective gastroenteritis and colitis, unspecified: Secondary | ICD-10-CM | POA: Diagnosis not present

## 2021-04-14 DIAGNOSIS — R1314 Dysphagia, pharyngoesophageal phase: Secondary | ICD-10-CM | POA: Diagnosis not present

## 2021-04-14 DIAGNOSIS — F419 Anxiety disorder, unspecified: Secondary | ICD-10-CM

## 2021-04-14 MED ORDER — RIFAXIMIN 550 MG PO TABS: 550.0000 mg | ORAL_TABLET | Freq: Two times a day (BID) | ORAL | 0 refills | Status: DC

## 2021-04-14 NOTE — Patient Instructions (Addendum)
If you are age 63 or older, your body mass index should be between 23-30. Your Body mass index is 31.42 kg/m. If this is out of the aforementioned range listed, please consider follow up with your Primary Care Provider.  If you are age 13 or younger, your body mass index should be between 19-25. Your Body mass index is 31.42 kg/m. If this is out of the aformentioned range listed, please consider follow up with your Primary Care Provider.   ________________________________________________________  The [facility] providers would like to encourage you to use [facility] to communicate with providers for non-urgent requests or questions.  Due to long hold times on the telephone, sending your provider a message by [facility] may be a faster and more efficient way to get a response.  Please allow 48 business hours for a response.  Please remember that this is for non-urgent requests.  _______________________________________________________   We have sent your demographic information and a prescription for Xifaxan to [facility]. This pharmacy is able to get medication approved through insurance and get you the lowest copay possible. If you have not heard from them within 1 week, please call our office at [phone] to let us know.   It was a pleasure to see you today!  Thank you for trusting me with your gastrointestinal care!

## 2021-04-14 NOTE — Progress Notes (Signed)
[facility] Progress Note  Chief Complaint: Dysphagia, diarrhea and abdominal pain  Subjective  History: Karen Casey follows up for her diarrhea and dysphagia.  Clinical details outlined in my extensive August 31 office note.  She had undergone upper endoscopy by Dr. Havery Moros during hospitalization shortly before that. MBS April 2022 (report on file) Normal inpatient ultrasound ruling out gallstones, normal a.m. cortisol level ruling out adrenal insufficiency. Stool studies for C. difficile, ova and parasites negative, fecal elastase normal.  (Alcohol abuse)  Dr. Rush Landmark performed her outpatient colonoscopy on September 29.  Report indicates it was an extremely challenging procedure difficult scope passage due to redundant and tortuous left colon anatomy.  The cecum was reached, prep was good, reportedly 10 mm polyp removed, pathology showing tubular adenoma.  Additional colon biopsies negative for microscopic colitis.  Reported hemorrhoids as well. _________________________  Karen Casey feels much the same as before, with dysphagia, reporting food stuck in the throat and difficult to pass and it makes her feel quite anxious.  She cannot understand why nothing was found in endoscopy and no dilation could be performed.  She still has multiple loose nonbloody stools per day as before, says that that and her dysphagia decrease her appetite and most days she does not feel like eating much. She admits having nausea "24/7" since she was a child, medicines do not seem to help including the Phenergan we recently tried the Zofran before that.  She is still drinking alcohol regularly and is somewhat evasive about the amount, but reports it is "less than before".  Her sister is again in attendance throughout the visit today and it is clear she helps Karen Casey a great deal and remains concerned.   (10 minutes late for today's visit) ROS: Cardiovascular:  no chest pain Respiratory: no dyspnea Chronic  dyspnea Anxiety and depression.  She was almost tearful at times during the visit The patient's Past Medical, Family and Social History were reviewed and are on file in the EMR. 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)    xanax withdrawl- December 2013   Urinary incontinence 06/09/2012   Past Surgical History:  Procedure Laterality Date   APPENDECTOMY     BIOPSY  01/04/2021   Procedure: BIOPSY;  Surgeon: Yetta Flock, MD;  Location: [facility];  Service: Gastroenterology;;   BIOPSY  03/12/2021   Procedure: BIOPSY;  Surgeon: Irving Copas., MD;  Location: [facility];  Service: Gastroenterology;;   CATARACT EXTRACTION  06/09/2012   Left eye   COLONOSCOPY WITH PROPOFOL N/A 03/12/2021   Procedure: COLONOSCOPY WITH PROPOFOL;  Surgeon: Irving Copas., MD;  Location: [facility];  Service: Gastroenterology;  Laterality: N/A;   ESOPHAGOGASTRODUODENOSCOPY (EGD) WITH PROPOFOL N/A 01/04/2021   Procedure: ESOPHAGOGASTRODUODENOSCOPY (EGD) WITH PROPOFOL;  Surgeon: Yetta Flock, MD;  Location: [facility];  Service: Gastroenterology;  Laterality: N/A;   left shoulder dislocation  Sept  2011   POLYPECTOMY  03/12/2021   Procedure: POLYPECTOMY;  Surgeon: Mansouraty, Telford Nab., MD;  Location: [facility];  Service: Gastroenterology;;    Objective:  Med list reviewed  Current Outpatient Medications:    acetaminophen (TYLENOL) 325 MG tablet, Take 975 mg by mouth See admin instructions. Take 975 mg daily, may  take a second 975 mg dose as needed for pain, Disp: , Rfl:    albuterol (VENTOLIN HFA) 108 (90 Base) MCG/ACT inhaler, TAKE 2 PUFFS BY MOUTH EVERY 6 HOURS AS NEEDED FOR WHEEZE OR SHORTNESS OF BREATH, Disp: 8.5 each, Rfl: 2   amLODipine (NORVASC) 5 MG tablet, TAKE 1 TABLET (5 MG TOTAL) BY MOUTH DAILY., Disp: 90 tablet, Rfl: 1   CALCIUM-MAGNESIUM-ZINC PO, Take 1 tablet by mouth daily., Disp: , Rfl:    clonazePAM (KLONOPIN) 1 MG tablet, Take 1 tablet (1 mg total) by mouth 2 (two) times daily for 28 days. Ok to fill on 03/26/21, Disp: 56 tablet, Rfl: 0   DULoxetine (CYMBALTA) 30 MG capsule, Take 1 capsule once daily x 1 week, then increase to 2 capsules daily for a total of 60mg  daily., Disp: 120 capsule, Rfl: 1   esomeprazole (NEXIUM) 40 MG capsule, TAKE 1 CAPSULE BY MOUTH EVERY DAY AT NOON, Disp: 90 capsule, Rfl: 1   feeding supplement (ENSURE ENLIVE / ENSURE PLUS) LIQD, Take 237 mLs by mouth 2 (two) times daily between meals., Disp: 237 mL, Rfl: 12   gabapentin (NEURONTIN) 600 MG tablet, Take 1 tablet (600 mg total) by mouth 2 (two) times daily., Disp: 180 tablet, Rfl: 0   hydrOXYzine (VISTARIL) 25 MG capsule, Take 1 capsule (25 mg total) by mouth every 6 (six) hours as needed., Disp: 90 capsule, Rfl: 0   Multiple Vitamins-Minerals (CENTRUM SILVER 50+WOMEN PO), Take 1 tablet by mouth daily., Disp: , Rfl:    nicotine (NICODERM CQ - DOSED IN MG/24 HOURS) 21 mg/24hr patch, Place 1 patch (21 mg total) onto the skin daily., Disp: 28 patch, Rfl: 1   NON FORMULARY, O2 use as needed, Disp: , Rfl:    ondansetron (ZOFRAN-ODT) 8 MG disintegrating tablet, Take 1 tablet (8 mg total) by mouth every 8 (eight) hours as needed for nausea or vomiting., Disp: 30 tablet, Rfl: 0   promethazine (PHENERGAN) 12.5 MG suppository, Place 12.5 mg rectally every 4 (four) hours as needed for nausea/vomiting., Disp: , Rfl:    promethazine (PHENERGAN) 12.5 MG tablet, Take 1 tablet (12.5 mg total) by mouth every 8 (eight) hours as  needed for nausea or vomiting., Disp: 21 tablet, Rfl: 0   rifaximin (XIFAXAN) 550 MG TABS tablet, Take 1 tablet (550 mg total) by mouth 2 (two) times daily., Disp: 20 tablet, Rfl: 0   umeclidinium-vilanterol (ANORO ELLIPTA) 62.5-25 MCG/ACT AEPB, TAKE 1 PUFF BY MOUTH EVERY DAY, Disp: 60 each, Rfl: 1   Vital signs in last 24 hrs: Vitals:   04/14/21 1050  BP: 140/86  Pulse: 63  SpO2: 98%   Wt Readings from Last 3 Encounters:  04/14/21 177 lb 6.4 oz (80.5 kg)  03/24/21 181 lb 3.2 oz (82.2 kg)  03/12/21 180 lb (81.6 kg)    Physical Exam  Chronically ill-appearing, anxious, pleasant and conversational. HEENT: sclera anicteric, oral mucosa moist without lesions Neck: supple, no thyromegaly, JVD or lymphadenopathy Cardiac: RRR without murmurs, S1S2 heard, no peripheral edema Pulm: clear to auscultation bilaterally, normal RR and effort noted Abdomen: soft, mild generalized tenderness, with active bowel sounds. No guarding or palpable hepatosplenomegaly. Skin; warm  and dry, no jaundice or rash  Labs:   ___________________________________________ Radiologic studies:   ____________________________________________ Other:   _____________________________________________ Assessment & Plan  Assessment: Encounter Diagnoses  Name Primary?   Chronic diarrhea Yes   Nausea and vomiting in adult    Pharyngoesophageal dysphagia    Generalized abdominal pain    I reviewed findings of all her studies so far with her and the MBS done in April of this year showing that she has decreased oropharyngeal motility, at least in part related to poor dentition.  It was noted on that study that she has periodic inattention to mastication and swallowing.  I reassured her that there is no structural abnormality or narrowing that would cause an impaction, and encouraged her to try best she can to be mindful about careful chewing and swallowing. I believe that is all related to her general debility from  years of alcohol abuse. Regarding the nausea, I do not know what else to use for medicines since Zofran and Phenergan have not helped much.  I do not want to give her metoclopramide due to its potential CNS side effects in a patient with ongoing alcohol abuse.  She unquestionably needs to stop drinking if that is feasible, and I encouraged her to seek help from AA or whatever avenues are necessary and available. Regarding her diarrhea, while I think there is a significant IBS related component, we also should rule out bacterial overgrowth.   Plan: Rifaximin 550 mg, 1 tablet 3 times daily for 14 days (if affordable, metronidazole and cephalexin if not) Imodium or Lomotil as needed Alcohol cessation  40 minutes were spent on this encounter (including chart review, history/exam, counseling/coordination of care, and documentation) > 50% of that time was spent on counseling and coordination of care.   Nelida Meuse III

## 2021-04-15 ENCOUNTER — Telehealth: Payer: Self-pay | Admitting: Gastroenterology

## 2021-04-15 ENCOUNTER — Institutional Professional Consult (permissible substitution): Payer: 59 | Admitting: Pulmonary Disease

## 2021-04-15 ENCOUNTER — Other Ambulatory Visit: Payer: Self-pay | Admitting: Gastroenterology

## 2021-04-15 NOTE — Telephone Encounter (Signed)
Patient saw doctor yesterday and thought she was getting refills on her nausea medication.  [facility] checked with the [facility] on [address], and they do not have anything called in for her.  Can you please call and advise.

## 2021-04-16 MED ORDER — PROMETHAZINE HCL 12.5 MG PO TABS: 12.5000 mg | ORAL_TABLET | Freq: Three times a day (TID) | ORAL | 1 refills | Status: DC | PRN

## 2021-04-16 NOTE — Telephone Encounter (Signed)
She indicated that neither ondansetron nor promethazine worked very well for her.  But if she thinks it is worth taking for some effect sometimes, then I refilled the phenergan tablets.  - HD

## 2021-04-17 ENCOUNTER — Ambulatory Visit (INDEPENDENT_AMBULATORY_CARE_PROVIDER_SITE_OTHER): Payer: 59 | Admitting: Family Medicine

## 2021-04-17 ENCOUNTER — Other Ambulatory Visit: Payer: Self-pay

## 2021-04-17 ENCOUNTER — Encounter: Payer: Self-pay | Admitting: Family Medicine

## 2021-04-17 VITALS — BP 118/74 | HR 66 | Ht 63.0 in | Wt 179.4 lb

## 2021-04-17 DIAGNOSIS — Z23 Encounter for immunization: Secondary | ICD-10-CM | POA: Diagnosis not present

## 2021-04-17 DIAGNOSIS — E222 Syndrome of inappropriate secretion of antidiuretic hormone: Secondary | ICD-10-CM

## 2021-04-17 DIAGNOSIS — J449 Chronic obstructive pulmonary disease, unspecified: Secondary | ICD-10-CM

## 2021-04-17 DIAGNOSIS — F419 Anxiety disorder, unspecified: Secondary | ICD-10-CM | POA: Diagnosis not present

## 2021-04-17 DIAGNOSIS — Z789 Other specified health status: Secondary | ICD-10-CM | POA: Diagnosis not present

## 2021-04-17 DIAGNOSIS — S22000A Wedge compression fracture of unspecified thoracic vertebra, initial encounter for closed fracture: Secondary | ICD-10-CM

## 2021-04-17 MED ORDER — DULOXETINE HCL 30 MG PO CPEP: ORAL_CAPSULE | ORAL | 1 refills | Status: DC

## 2021-04-17 NOTE — Assessment & Plan Note (Signed)
-   Congratulated patient on continued abstinence

## 2021-04-17 NOTE — Patient Instructions (Addendum)
It was wonderful to see you today.  Please bring ALL of your medications with you to every visit.   Today we talked about:  -I have refilled your Cymbalta, please start taking 30 mg daily and after a week increase to 60 mg. -Please come in in 2 weeks to get a BMP to check your sodium levels once you have started the Cymbalta - I will call you about setting up the IV osteoporosis medication - I have sent a message to pharmacy to discuss neck steps for your inhalers, I will call you with this - Please schedule a follow-up appointment in 1 month   Thank you for choosing [facility].   Please call [phone] with any questions about today's appointment.  Please be sure to schedule follow up at the front  desk before you leave today.   Yehuda Savannah, MD  Family Medicine

## 2021-04-17 NOTE — Assessment & Plan Note (Addendum)
-   Still using breakthrough albuterol inhaler more than twice a week on her Anoro Ellipta, will check on insurance coverage for Trelegy or other triple therapy versus adding Spiriva -No signs of exacerbation today

## 2021-04-17 NOTE — Addendum Note (Signed)
Addended by: Londell Moh T on: 04/17/2021 02:20 PM   Modules accepted: Orders, SmartSet

## 2021-04-17 NOTE — Progress Notes (Signed)
    SUBJECTIVE:   CHIEF COMPLAINT / HPI:   Anxiety and depression- doing okay.  Did not start Cymbalta yet as she was told she can pick it up again and she lost the prescription she picked up.  Would like a refill today.  Still open to try it.  Has not noticed any difference now that she is not taking the Xanax as she is doing fine.  She is taking 1-2 Klonopin per day.  She still has 11 Klonopin left and is due for refill on 04/21/2021.  She still does not want to talk to a counselor.  Denies SI.  History of compression fracture- qualifies for osteoporosis treatment. Would need IV reclast as she cannot tolerate oral bisphosphenates due to her severe GERD. Has not had a DEXA scan before. Has appt with pain management next week.  Does not want to be on opioids or anything addictive for pain.  Alcohol use disorder-she has not drank for the past 2 weeks, had a close friend die in September from liver disease at a young age and this is motivating her.  COPD-she still uses her albuterol inhaler quite a bit, no fevers, change in color of sputum, or signs of exacerbation.  Still smoking a half a pack per day.  PERTINENT  PMH / PSH: COPD, anxiety, osteoporosis with history of compression fracture, alcohol use disorder  OBJECTIVE:   BP 118/74   Pulse 66   Ht 5\' 3"  (1.6 m)   Wt 179 lb 6.4 oz (81.4 kg)   SpO2 95%   BMI 31.78 kg/m   General: A&O, NAD HEENT: No sign of trauma, EOM grossly intact Cardiac: RRR, no m/r/g Respiratory: CTAB, normal WOB, no w/c/r GI: Soft, NTTP, non-distended  Extremities: NTTP, no peripheral edema. Neuro: Normal gait, moves all four extremities appropriately. Psych: Appropriate mood and affect   ASSESSMENT/PLAN:   Anxiety disorder - Cymbalta recent, will start at 30 mg daily for a week and then increase to 60 mg daily - Repeat BMP 2 weeks after taking Cymbalta to recheck sodium - Continue Klonopin 1 mg twice daily, with plans to wean to 1 mg daily at next  visit - Denies SI, not interested in counseling today  Alcohol use - Congratulated patient on continued abstinence  Compression fracture of body of thoracic vertebra (HCC) - This qualifies patient for osteoporosis, will set up IV Reclast infusion as she will not be able to tolerate p.o. bisphosphonates due to her severe GERD  Chronic obstructive pulmonary disease, group B, by Global Initiative for Chronic Obstructive Lung Disease 2017 classification (HCC) - Still using breakthrough albuterol inhaler more than twice a week on her Anoro Ellipta, will check on insurance coverage for Trelegy or other triple therapy versus adding Spiriva -No signs of exacerbation today    Tdap vaccination given today. She was reminded she can get the shingles vaccine at the pharmacy.  [name], MD [facility]

## 2021-04-17 NOTE — Assessment & Plan Note (Signed)
-   Cymbalta recent, will start at 30 mg daily for a week and then increase to 60 mg daily - Repeat BMP 2 weeks after taking Cymbalta to recheck sodium - Continue Klonopin 1 mg twice daily, with plans to wean to 1 mg daily at next visit - Denies SI, not interested in counseling today

## 2021-04-17 NOTE — Assessment & Plan Note (Signed)
-   This qualifies patient for osteoporosis, will set up IV Reclast infusion as she will not be able to tolerate p.o. bisphosphonates due to her severe GERD

## 2021-04-20 ENCOUNTER — Other Ambulatory Visit: Payer: Self-pay

## 2021-04-20 DIAGNOSIS — F419 Anxiety disorder, unspecified: Secondary | ICD-10-CM

## 2021-04-20 MED ORDER — CLONAZEPAM 1 MG PO TABS: 1.0000 mg | ORAL_TABLET | Freq: Two times a day (BID) | ORAL | 0 refills | Status: DC

## 2021-04-22 ENCOUNTER — Ambulatory Visit: Payer: 59 | Admitting: Family Medicine

## 2021-04-22 ENCOUNTER — Telehealth: Payer: Self-pay | Admitting: Family Medicine

## 2021-04-22 DIAGNOSIS — J449 Chronic obstructive pulmonary disease, unspecified: Secondary | ICD-10-CM

## 2021-04-22 DIAGNOSIS — S22000A Wedge compression fracture of unspecified thoracic vertebra, initial encounter for closed fracture: Secondary | ICD-10-CM

## 2021-04-22 MED ORDER — TRELEGY ELLIPTA 100-62.5-25 MCG/ACT IN AEPB: 1.0000 | INHALATION_SPRAY | Freq: Every day | RESPIRATORY_TRACT | 1 refills | Status: AC

## 2021-04-22 NOTE — Telephone Encounter (Signed)
Called patient x2, left VM.  Discussed in VM that I have prescribed new inhaler, Trelegy to do 1 puff once daily, and to discontinue her Anora Ellipta when she gets the new inhaler.  Also discussed that she needs DXA scan to qualify for IV reclast medicine for bone strength, so I have ordered this and if she will call back and confirm that she is willing to do it we can schedule it for her.  If patient calls back, please confirm she got VM about new inhaler and if we can go ahead and schedule her for bone dexa scan.  Yehuda Savannah MD

## 2021-04-24 ENCOUNTER — Telehealth: Payer: Self-pay | Admitting: Gastroenterology

## 2021-04-24 NOTE — Telephone Encounter (Signed)
Inbound call from pt requesting a call back stating that she was waiting to see if her medication was approved. Pt did not know what medication it was she just stated it was an antibiotic. Please advise. Thank you.

## 2021-04-25 ENCOUNTER — Other Ambulatory Visit: Payer: Self-pay | Admitting: Family Medicine

## 2021-04-25 DIAGNOSIS — F419 Anxiety disorder, unspecified: Secondary | ICD-10-CM

## 2021-04-28 ENCOUNTER — Other Ambulatory Visit: Payer: Self-pay

## 2021-04-28 MED ORDER — RIFAXIMIN 550 MG PO TABS: 550.0000 mg | ORAL_TABLET | Freq: Two times a day (BID) | ORAL | 0 refills | Status: DC

## 2021-04-28 NOTE — Telephone Encounter (Signed)
Patient called stating "I got nowhere" with calling the pharmaceutical company, stating "I really need this medication."  Please call patient and advise.  Thank you.

## 2021-04-28 NOTE — Telephone Encounter (Signed)
Patient has requested this RX to go to her regular pharmacy

## 2021-04-28 NOTE — Telephone Encounter (Signed)
New Rx sent to [facility]. Will await communication from them.

## 2021-04-28 NOTE — Addendum Note (Signed)
Addended by: Elias Else on: 04/28/2021 03:45 PM   Modules accepted: Orders

## 2021-04-28 NOTE — Telephone Encounter (Signed)
Inbound call from patient following up about previous request about medication approval.

## 2021-04-28 NOTE — Telephone Encounter (Signed)
After talking to the patient she never returned calls to [facility] to finish the prior authorization process. Contact information for [facility] given.

## 2021-04-29 ENCOUNTER — Encounter: Payer: 59 | Attending: Physical Medicine and Rehabilitation | Admitting: Physical Medicine and Rehabilitation

## 2021-05-01 NOTE — Telephone Encounter (Signed)
Information has been sent to [facility] already and patient is aware we are awaiting response

## 2021-05-01 NOTE — Telephone Encounter (Signed)
Patient calling to follow up on antibiotic medication.

## 2021-05-04 NOTE — Telephone Encounter (Signed)
Phone call to [facility] to check status of prior auth.  Vaughan Basta at [facility] asked for office notes to be resent.  She stated that they had not been received.  I explained to Vaughan Basta that we had confirmation that records had been sent and received.  Records refaxed to [phone].  Vaughan Basta states once information was received they would update our office on the decision.

## 2021-05-05 ENCOUNTER — Telehealth: Payer: Self-pay | Admitting: Gastroenterology

## 2021-05-05 NOTE — Telephone Encounter (Signed)
Patient called to follow up if an antibiotic was going to be called in for her.  Please call patient and advise. (She requested to be called today, if possible.)

## 2021-05-05 NOTE — Telephone Encounter (Signed)
Returned patient's call and let her know that her information was refaxed to [facility] yesterday and they would contact our office once they had a decision.

## 2021-05-06 ENCOUNTER — Other Ambulatory Visit: Payer: Self-pay | Admitting: Family Medicine

## 2021-05-06 ENCOUNTER — Telehealth: Payer: Self-pay | Admitting: Family Medicine

## 2021-05-06 DIAGNOSIS — L989 Disorder of the skin and subcutaneous tissue, unspecified: Secondary | ICD-10-CM

## 2021-05-06 DIAGNOSIS — S22000A Wedge compression fracture of unspecified thoracic vertebra, initial encounter for closed fracture: Secondary | ICD-10-CM

## 2021-05-06 DIAGNOSIS — F419 Anxiety disorder, unspecified: Secondary | ICD-10-CM

## 2021-05-06 NOTE — Telephone Encounter (Signed)
Patient called to request a referral for dermatology. She says she has a mole on her face and hip and they are starting to worry it. Please call patient with an update.

## 2021-05-11 NOTE — Telephone Encounter (Signed)
Patient called and wanted to let you know that  [facility] denied her medication and what's next?  Please call.  Thank you.

## 2021-05-11 NOTE — Telephone Encounter (Signed)
We couldn't get Xifaxan covered with insurance. Patient is aware to come to the office to pick up her sample course of treatment. She was very thankful for this.

## 2021-05-11 NOTE — Telephone Encounter (Signed)
Patient has been notified to come to the office for a course of treatment with Xifaxan.

## 2021-05-12 ENCOUNTER — Other Ambulatory Visit: Payer: Self-pay

## 2021-05-12 ENCOUNTER — Ambulatory Visit
Admission: RE | Admit: 2021-05-12 | Discharge: 2021-05-12 | Disposition: A | Discharge status: Home / Self Care | Payer: 59 | Source: Ambulatory Visit | Attending: Family Medicine | Admitting: Family Medicine

## 2021-05-12 DIAGNOSIS — S22000A Wedge compression fracture of unspecified thoracic vertebra, initial encounter for closed fracture: Secondary | ICD-10-CM

## 2021-05-13 ENCOUNTER — Telehealth: Payer: Self-pay | Admitting: Family Medicine

## 2021-05-13 ENCOUNTER — Telehealth: Payer: Self-pay

## 2021-05-13 DIAGNOSIS — E222 Syndrome of inappropriate secretion of antidiuretic hormone: Secondary | ICD-10-CM

## 2021-05-13 DIAGNOSIS — F419 Anxiety disorder, unspecified: Secondary | ICD-10-CM

## 2021-05-13 MED ORDER — CLONAZEPAM 1 MG PO TABS: 1.0000 mg | ORAL_TABLET | Freq: Two times a day (BID) | ORAL | 0 refills | Status: DC

## 2021-05-13 NOTE — Telephone Encounter (Signed)
Patient calls nurse line regarding issues with Duloxetine prescription. Reports following the directions and took 1 tablet daily on the first week and increased to 2 tablets on the second week.   Patient states that she does not like how the medication makes her feel. Reports having increased anxiety and shaking in her legs. Patient states that she has decreased back to 1 tablet daily, as she would like to come off medication completely.   Please advise.   Talbot Grumbling, RN

## 2021-05-13 NOTE — Telephone Encounter (Signed)
Called and spoke with Ms. Rutan.  She notes "this new medicine is messed me up."  This is her third week of taking the duloxetine but yesterday she decreased back down to 1 tab because she does not like the way it makes her feel.  She notes that it makes her feel shaky.  We discussed how she has not come back in for lab work and I future ordered a BMP for [place] to recheck her sodium.  She is not sure if she can come in today but she will try.  Discussed the importance of this.  Also asking for refill of her Klonopin.  Discussed that she should be unable to make it until December 5 because she got a 28-day supply, she states she only has 6 tabs left and thus can only make it to the Saturday.  Discussed that this means she has been taking more than prescribed as she is only supposed be taking 2 tabs per day.  Have given a 1 week prescription able to be picked up this Friday, 05/15/2021 that we will get her to her appointment with me on 05/22/2021.  At this time I discussed we will taper down.  I will move to 0.5 mg tabs with her be able to get 3 a day for 2 weeks, then 2 a day for 2 weeks, then 1 a day for 2 weeks, and then stop.  We discussed taking the duloxetine for 4 more days to taper off and then discontinuing.  Discussed ED return precautions.  Answered all question concerns.  Yehuda Savannah MD

## 2021-05-18 ENCOUNTER — Telehealth: Payer: Self-pay

## 2021-05-18 ENCOUNTER — Encounter (HOSPITAL_BASED_OUTPATIENT_CLINIC_OR_DEPARTMENT_OTHER): Payer: Self-pay | Admitting: Emergency Medicine

## 2021-05-18 ENCOUNTER — Emergency Department (HOSPITAL_BASED_OUTPATIENT_CLINIC_OR_DEPARTMENT_OTHER): Payer: 59

## 2021-05-18 ENCOUNTER — Emergency Department (HOSPITAL_BASED_OUTPATIENT_CLINIC_OR_DEPARTMENT_OTHER)
Admission: EM | Admit: 2021-05-18 | Discharge: 2021-05-18 | Disposition: A | Discharge status: Home / Self Care | Payer: 59 | Attending: Emergency Medicine | Admitting: Emergency Medicine

## 2021-05-18 ENCOUNTER — Other Ambulatory Visit: Payer: Self-pay

## 2021-05-18 DIAGNOSIS — F17210 Nicotine dependence, cigarettes, uncomplicated: Secondary | ICD-10-CM | POA: Diagnosis not present

## 2021-05-18 DIAGNOSIS — R000 Tachycardia, unspecified: Secondary | ICD-10-CM | POA: Diagnosis not present

## 2021-05-18 DIAGNOSIS — N3000 Acute cystitis without hematuria: Secondary | ICD-10-CM | POA: Insufficient documentation

## 2021-05-18 DIAGNOSIS — R112 Nausea with vomiting, unspecified: Secondary | ICD-10-CM | POA: Insufficient documentation

## 2021-05-18 DIAGNOSIS — Z20822 Contact with and (suspected) exposure to covid-19: Secondary | ICD-10-CM | POA: Insufficient documentation

## 2021-05-18 DIAGNOSIS — Z7951 Long term (current) use of inhaled steroids: Secondary | ICD-10-CM | POA: Insufficient documentation

## 2021-05-18 DIAGNOSIS — J449 Chronic obstructive pulmonary disease, unspecified: Secondary | ICD-10-CM | POA: Insufficient documentation

## 2021-05-18 DIAGNOSIS — I10 Essential (primary) hypertension: Secondary | ICD-10-CM | POA: Diagnosis not present

## 2021-05-18 DIAGNOSIS — E876 Hypokalemia: Secondary | ICD-10-CM | POA: Diagnosis not present

## 2021-05-18 DIAGNOSIS — Z79899 Other long term (current) drug therapy: Secondary | ICD-10-CM | POA: Diagnosis not present

## 2021-05-18 DIAGNOSIS — R1110 Vomiting, unspecified: Secondary | ICD-10-CM

## 2021-05-18 DIAGNOSIS — R197 Diarrhea, unspecified: Secondary | ICD-10-CM | POA: Insufficient documentation

## 2021-05-18 LAB — URINALYSIS, ROUTINE W REFLEX MICROSCOPIC
Bilirubin Urine: NEGATIVE
Glucose, UA: NEGATIVE mg/dL
Hgb urine dipstick: NEGATIVE
Ketones, ur: NEGATIVE mg/dL
Nitrite: NEGATIVE
Protein, ur: 30 mg/dL — AB
Specific Gravity, Urine: 1.025 (ref 1.005–1.030)
pH: 6 (ref 5.0–8.0)

## 2021-05-18 LAB — COMPREHENSIVE METABOLIC PANEL
ALT: 21 U/L (ref 0–44)
AST: 27 U/L (ref 15–41)
Albumin: 4.2 g/dL (ref 3.5–5.0)
Alkaline Phosphatase: 99 U/L (ref 38–126)
Anion gap: 12 (ref 5–15)
BUN: 18 mg/dL (ref 8–23)
CO2: 30 mmol/L (ref 22–32)
Calcium: 10.7 mg/dL — ABNORMAL HIGH (ref 8.9–10.3)
Chloride: 91 mmol/L — ABNORMAL LOW (ref 98–111)
Creatinine, Ser: 1 mg/dL (ref 0.44–1.00)
GFR, Estimated: >60 mL/min (ref >60)
Glucose, Bld: 115 mg/dL — ABNORMAL HIGH (ref 70–99)
Potassium: 3.1 mmol/L — ABNORMAL LOW (ref 3.5–5.1)
Sodium: 133 mmol/L — ABNORMAL LOW (ref 135–145)
Total Bilirubin: 0.9 mg/dL (ref 0.3–1.2)
Total Protein: 7.1 g/dL (ref 6.5–8.1)

## 2021-05-18 LAB — CBC WITH DIFFERENTIAL/PLATELET
Abs Immature Granulocytes: 0.03 10*3/uL (ref 0.00–0.07)
Basophils Absolute: 0 10*3/uL (ref 0.0–0.1)
Basophils Relative: 0 %
Eosinophils Absolute: 0 10*3/uL (ref 0.0–0.5)
Eosinophils Relative: 0 %
HCT: 46.1 % — ABNORMAL HIGH (ref 36.0–46.0)
Hemoglobin: 16.6 g/dL — ABNORMAL HIGH (ref 12.0–15.0)
Immature Granulocytes: 0 %
Lymphocytes Relative: 22 %
Lymphs Abs: 2.1 10*3/uL (ref 0.7–4.0)
MCH: 32 pg (ref 26.0–34.0)
MCHC: 36 g/dL (ref 30.0–36.0)
MCV: 89 fL (ref 80.0–100.0)
Monocytes Absolute: 0.9 10*3/uL (ref 0.1–1.0)
Monocytes Relative: 10 %
Neutro Abs: 6.6 10*3/uL (ref 1.7–7.7)
Neutrophils Relative %: 68 %
Platelets: 208 10*3/uL (ref 150–400)
RBC: 5.18 MIL/uL — ABNORMAL HIGH (ref 3.87–5.11)
RDW: 12.6 % (ref 11.5–15.5)
WBC: 9.7 10*3/uL (ref 4.0–10.5)
nRBC: 0 % (ref 0.0–0.2)

## 2021-05-18 LAB — MAGNESIUM: Magnesium: 1.8 mg/dL (ref 1.7–2.4)

## 2021-05-18 LAB — RESP PANEL BY RT-PCR (FLU A&B, COVID) ARPGX2
Influenza A by PCR: NEGATIVE
Influenza B by PCR: NEGATIVE
SARS Coronavirus 2 by RT PCR: NEGATIVE

## 2021-05-18 LAB — LIPASE, BLOOD: Lipase: 20 U/L (ref 11–51)

## 2021-05-18 MED ORDER — HYDROMORPHONE HCL 1 MG/ML IJ SOLN: 0.5000 mg | Freq: Once | INTRAMUSCULAR | Status: DC

## 2021-05-18 MED ORDER — POTASSIUM CHLORIDE 20 MEQ PO PACK
40.0000 meq | PACK | Freq: Two times a day (BID) | ORAL | Status: DC
Start: 2021-05-18 — End: 2021-05-18
  Administered 2021-05-18: 40 meq via ORAL
  Filled 2021-05-18: qty 2

## 2021-05-18 MED ORDER — LACTATED RINGERS IV BOLUS
1000.0000 mL | Freq: Once | INTRAVENOUS | Status: AC
  Administered 2021-05-18: 1000 mL via INTRAVENOUS

## 2021-05-18 MED ORDER — LIDOCAINE VISCOUS HCL 2 % MT SOLN
15.0000 mL | Freq: Once | OROMUCOSAL | Status: AC
  Administered 2021-05-18: 15 mL via ORAL
  Filled 2021-05-18: qty 15

## 2021-05-18 MED ORDER — ALUM & MAG HYDROXIDE-SIMETH 200-200-20 MG/5ML PO SUSP
30.0000 mL | Freq: Once | ORAL | Status: AC
  Administered 2021-05-18: 30 mL via ORAL
  Filled 2021-05-18: qty 30

## 2021-05-18 MED ORDER — METOCLOPRAMIDE HCL 5 MG/ML IJ SOLN
10.0000 mg | Freq: Once | INTRAMUSCULAR | Status: AC
  Administered 2021-05-18: 10 mg via INTRAVENOUS
  Filled 2021-05-18: qty 2

## 2021-05-18 MED ORDER — FAMOTIDINE IN NACL 20-0.9 MG/50ML-% IV SOLN
20.0000 mg | Freq: Once | INTRAVENOUS | Status: AC
  Administered 2021-05-18: 20 mg via INTRAVENOUS
  Filled 2021-05-18: qty 50

## 2021-05-18 MED ORDER — MAGNESIUM SULFATE 2 GM/50ML IV SOLN
2.0000 g | Freq: Once | INTRAVENOUS | Status: AC
  Administered 2021-05-18: 2 g via INTRAVENOUS
  Filled 2021-05-18: qty 50

## 2021-05-18 MED ORDER — NITROFURANTOIN MONOHYD MACRO 100 MG PO CAPS: 100.0000 mg | ORAL_CAPSULE | Freq: Two times a day (BID) | ORAL | 0 refills | Status: DC

## 2021-05-18 MED ORDER — LORAZEPAM 2 MG/ML IJ SOLN
1.0000 mg | Freq: Once | INTRAMUSCULAR | Status: AC
  Administered 2021-05-18: 1 mg via INTRAVENOUS
  Filled 2021-05-18: qty 1

## 2021-05-18 NOTE — Telephone Encounter (Signed)
Patient calls nurse line reporting flu like symptoms since Thursday. Patient reports a tmax of 101, headaches, SOB, NVD and fatigue.   Patient reports her sister wants to take her to the [facility] to be evaluated.   Advised patient this is a good idea, as she sounded more SOB on the phone. Patient agreed with plan and wants PCP to be aware.

## 2021-05-18 NOTE — ED Provider Notes (Signed)
[facility] EMERGENCY DEPT Provider Note   CSN: [id number] Arrival date & time: 05/18/21  7353     History Chief Complaint  Patient presents with   Emesis    Karen Casey is a 65 y.o. female.   Emesis Associated symptoms: abdominal pain, chills, cough, diarrhea, fever and headaches   Associated symptoms: no arthralgias and no sore throat   Patient presents for 5 days of flulike symptoms.  She endorses fever, headache, shortness of breath, nausea, vomiting, diarrhea, and fatigue.  Medical history is notable for GERD, alcohol use, HTN, and atrial fibrillation.  She states that her atrial fibrillation was in the setting of benzodiazepine withdrawal and has since resolved.  She is most concerned about her recent symptoms of nausea, vomiting, and p.o. intolerance.  She states that she has had a long history of this and typically requires IV fluids in the ED when these episodes occur.  At home, she has taken Pepcid for relief of her symptoms.  She states that she previously had a prescription for Zofran but had ran out.  She has had recent dysuria.  She does continue to drink but has not had any alcohol in the past 5 days.  She continues to take twice daily Klonopin but has not been able to take this, or any of her home medications due to her p.o. intolerance.  She describes her current symptoms as very similar to previous episodes of cyclic vomiting.   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   Seizure ([place])    08/2020 per patient   Seizures ([place])    xanax withdrawl- December  2013   Urinary incontinence 06/09/2012    Patient Active Problem List   Diagnosis Date Noted   Hepatic steatosis 01/05/2021   Chronic obstructive pulmonary disease, group B, by Global Initiative for Chronic Obstructive Lung Disease 2017 classification ([place]) 12/09/2020   Chronic pain of breast 11/12/2020   Compression fracture of body of thoracic vertebra ([place]) 11/06/2020   Drug withdrawal seizure with complication ([place]) 29/92/4268   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   Other chest pain    Hypomagnesemia    Long term (current) use of anticoagulants    Protein-calorie malnutrition ([place]) 09/17/2020   Paroxysmal atrial fibrillation ([place])    Alcohol withdrawal ([place]) 09/10/2020   Pulmonary nodules/lesions, multiple 07/14/2020   Chronic left shoulder pain 06/22/2020   HTN (hypertension) 06/02/2018   Body mass index (BMI) 31.0-31.9, adult 03/29/2013   Diarrhea 10/20/2012   Anxiety disorder 06/12/2012   Alcohol use 05/28/2012   Benzodiazepine withdrawal without complication ([place]) 34/19/6222   Tobacco abuse 02/17/2012   GERD (gastroesophageal reflux disease) 02/04/2012   Depression 02/04/2012    Past Surgical History:  Procedure Laterality Date   APPENDECTOMY     BIOPSY  01/04/2021   Procedure: BIOPSY;  Surgeon: Yetta Flock, MD;  Location: [facility];  Service: Gastroenterology;;   BIOPSY  03/12/2021   Procedure: BIOPSY;  Surgeon:  Mansouraty, Telford Nab., MD;  Location: [facility];  Service: Gastroenterology;;   CATARACT EXTRACTION  06/09/2012   Left eye   COLONOSCOPY WITH PROPOFOL N/A 03/12/2021   Procedure: COLONOSCOPY WITH PROPOFOL;  Surgeon: Rush Landmark Telford Nab., MD;  Location: [facility];  Service: Gastroenterology;  Laterality: N/A;   ESOPHAGOGASTRODUODENOSCOPY (EGD) WITH PROPOFOL N/A 01/04/2021   Procedure: ESOPHAGOGASTRODUODENOSCOPY (EGD) WITH PROPOFOL;  Surgeon: Yetta Flock, MD;  Location: [facility];  Service:  Gastroenterology;  Laterality: N/A;   left shoulder dislocation  Sept 2011   POLYPECTOMY  03/12/2021   Procedure: POLYPECTOMY;  Surgeon: Mansouraty, Telford Nab., MD;  Location: [facility];  Service: Gastroenterology;;     OB History   No obstetric history on file.     Family History  Problem Relation Age of Onset   Hypertension Mother    Hyperlipidemia Mother    Aneurysm Mother        Rupture - Cause of death   Heart disease Father        MI - cause of death   Depression Father    Parkinsonism Father    Hypertension Sister    Colon cancer Neg Hx    Esophageal cancer Neg Hx    Rectal cancer Neg Hx    Stomach cancer Neg Hx     Social History   Tobacco Use   Smoking status: Every Day    Packs/day: 0.50    Years: 43.00    Pack years: 21.50    Types: Cigarettes   Smokeless tobacco: Never   Tobacco comments:    Tobacco info given  Vaping Use   Vaping Use: Never used  Substance Use Topics   Alcohol use: Yes    Alcohol/week: 3.0 standard drinks    Types: 3 Cans of beer per week    Comment: 2-3 times week just beer   Drug use: Not Currently    Types: Marijuana, Other-see comments    Comment: Past hx of benzo abuse--xanax    Home Medications Prior to Admission medications   Medication Sig Start Date End Date Taking? Authorizing Provider  nitrofurantoin, macrocrystal-monohydrate, (MACROBID) 100 MG capsule Take 1 capsule (100 mg total) by mouth 2 (two) times daily. 05/18/21  Yes Godfrey Pick, MD  acetaminophen (TYLENOL) 325 MG tablet Take 975 mg by mouth See admin instructions. Take 975 mg daily, may take a second 975 mg dose as needed for pain    [provider]  albuterol (VENTOLIN HFA) 108 (90 Base) MCG/ACT inhaler TAKE 2 PUFFS BY MOUTH EVERY 6 HOURS AS NEEDED FOR WHEEZE OR SHORTNESS OF BREATH 03/16/21   Cantwell, Celeste C, PA-C  amLODipine (NORVASC) 5 MG tablet TAKE 1 TABLET (5 MG TOTAL) BY MOUTH DAILY. 01/22/21 05/22/21  Cantwell, Celeste C, PA-C   CALCIUM-MAGNESIUM-ZINC PO Take 1 tablet by mouth daily.    [provider]  clonazePAM (KLONOPIN) 1 MG tablet Take 1 tablet (1 mg total) by mouth 2 (two) times daily for 7 days. Ok to fill on 05/15/21 05/15/21 05/22/21  [name], MD  DULoxetine (CYMBALTA) 30 MG capsule TAKE 1 CAPSULE ONCE DAILY X 1 WEEK, THEN INCREASE TO 2 CAPSULES DAILY FOR A TOTAL OF 60MG  DAILY. 04/27/21   [name], MD  esomeprazole (NEXIUM) 40 MG capsule TAKE 1 CAPSULE BY MOUTH EVERY DAY AT NOON 03/26/21   [name], MD  feeding supplement (ENSURE ENLIVE / ENSURE PLUS) LIQD Take 237 mLs by mouth 2 (two) times daily between meals. 01/06/21  Alen Bleacher, MD  Fluticasone-Umeclidin-Vilant (TRELEGY ELLIPTA) 100-62.5-25 MCG/ACT AEPB Inhale 1 puff into the lungs daily. 04/22/21 05/22/21  [name], MD  gabapentin (NEURONTIN) 600 MG tablet Take 1 tablet (600 mg total) by mouth 2 (two) times daily. 03/24/21   [name], MD  hydrOXYzine (VISTARIL) 25 MG capsule TAKE 1 CAPSULE (25 MG TOTAL) BY MOUTH EVERY 6 (SIX) HOURS AS NEEDED. 05/06/21 06/05/21  [name], MD  Multiple Vitamins-Minerals (CENTRUM SILVER 50+WOMEN PO) Take 1 tablet by mouth daily.    [provider]  nicotine (NICODERM CQ - DOSED IN MG/24 HOURS) 21 mg/24hr patch Place 1 patch (21 mg total) onto the skin daily. 01/08/21   Zenia Resides, MD  NON FORMULARY O2 use as needed    [provider]  ondansetron (ZOFRAN-ODT) 8 MG disintegrating tablet Take 1 tablet (8 mg total) by mouth every 8 (eight) hours as needed for nausea or vomiting. 03/12/21   Mansouraty, Telford Nab., MD  promethazine (PHENERGAN) 12.5 MG suppository Place 12.5 mg rectally every 4 (four) hours as needed for nausea/vomiting. 01/26/21   [provider]  promethazine (PHENERGAN) 12.5 MG tablet Take 1 tablet (12.5 mg total) by mouth every 8 (eight) hours as needed for nausea or vomiting. 04/16/21   Doran Stabler, MD  rifaximin  (XIFAXAN) 550 MG TABS tablet Take 1 tablet (550 mg total) by mouth 2 (two) times daily. 04/28/21   Doran Stabler, MD    Allergies    Patient has no known allergies.  Review of Systems   Review of Systems  Constitutional:  Positive for appetite change, chills, fatigue and fever.  HENT:  Positive for congestion. Negative for ear pain and sore throat.   Eyes:  Negative for pain and visual disturbance.  Respiratory:  Positive for cough and shortness of breath.   Cardiovascular:  Negative for chest pain and palpitations.  Gastrointestinal:  Positive for abdominal pain, diarrhea, nausea and vomiting.  Genitourinary:  Positive for dysuria. Negative for flank pain, hematuria and pelvic pain.  Musculoskeletal:  Negative for arthralgias, back pain and neck pain.  Skin:  Negative for color change and rash.  Neurological:  Positive for headaches. Negative for dizziness, seizures, syncope, weakness and numbness.  Hematological:  Does not bruise/bleed easily.  Psychiatric/Behavioral:  Negative for confusion and decreased concentration.   All other systems reviewed and are negative.  Physical Exam Updated Vital Signs BP (!) 171/98   Pulse 98   Temp 98 F (36.7 C)   Resp 16   Ht 5\' 3"  (1.6 m)   Wt 78.9 kg   SpO2 98%   BMI 30.82 kg/m   Physical Exam Vitals and nursing note reviewed.  Constitutional:      General: She is not in acute distress.    Appearance: Normal appearance. She is well-developed and normal weight. She is not ill-appearing or toxic-appearing.  HENT:     Head: Normocephalic and atraumatic.     Right Ear: External ear normal.     Left Ear: External ear normal.     Nose: Nose normal.     Mouth/Throat:     Mouth: Mucous membranes are dry.     Pharynx: Oropharynx is clear.  Eyes:     General: No scleral icterus.    Extraocular Movements: Extraocular movements intact.     Conjunctiva/sclera: Conjunctivae normal.  Cardiovascular:     Rate and Rhythm: Tachycardia  present.     Heart sounds: No murmur  heard. Pulmonary:     Effort: Pulmonary effort is normal. No respiratory distress.     Breath sounds: Normal breath sounds. No wheezing or rales.  Chest:     Chest wall: No tenderness.  Abdominal:     Palpations: Abdomen is soft.     Tenderness: There is abdominal tenderness. There is no right CVA tenderness or left CVA tenderness.  Musculoskeletal:        General: No swelling.     Cervical back: Normal range of motion and neck supple. No rigidity.     Right lower leg: No edema.     Left lower leg: No edema.  Skin:    General: Skin is warm and dry.     Capillary Refill: Capillary refill takes less than 2 seconds.     Coloration: Skin is not jaundiced or pale.  Neurological:     General: No focal deficit present.     Mental Status: She is alert and oriented to person, place, and time.     Cranial Nerves: No cranial nerve deficit.     Sensory: No sensory deficit.     Motor: No weakness.  Psychiatric:        Mood and Affect: Mood normal.        Behavior: Behavior normal.        Thought Content: Thought content normal.        Judgment: Judgment normal.    ED Results / Procedures / Treatments   Labs (all labs ordered are listed, but only abnormal results are displayed) Labs Reviewed  COMPREHENSIVE METABOLIC PANEL - Abnormal; Notable for the following components:      Result Value   Sodium 133 (*)    Potassium 3.1 (*)    Chloride 91 (*)    Glucose, Bld 115 (*)    Calcium 10.7 (*)    All other components within normal limits  CBC WITH DIFFERENTIAL/PLATELET - Abnormal; Notable for the following components:   RBC 5.18 (*)    Hemoglobin 16.6 (*)    HCT 46.1 (*)    All other components within normal limits  URINALYSIS, ROUTINE W REFLEX MICROSCOPIC - Abnormal; Notable for the following components:   APPearance HAZY (*)    Protein, ur 30 (*)    Leukocytes,Ua TRACE (*)    Non Squamous Epithelial 0-5 (*)    All other components within normal  limits  RESP PANEL BY RT-PCR (FLU A&B, COVID) ARPGX2  LIPASE, BLOOD  MAGNESIUM    EKG None  Radiology DG Chest Portable 1 View  Result Date: 05/18/2021 CLINICAL DATA:  Cough EXAM: PORTABLE CHEST 1 VIEW COMPARISON:  12/08/2020 FINDINGS: Heart size and mediastinal contours are normal. No pleural effusion or edema identified. No airspace opacities identified. The visualized osseous structures appear intact. IMPRESSION: No acute cardiopulmonary abnormalities. Electronically Signed   By: Kerby Moors M.D.   On: 05/18/2021 11:34    Procedures Procedures   Medications Ordered in ED Medications  lactated ringers bolus 1,000 mL (0 mLs Intravenous Stopped 05/18/21 1337)  metoCLOPramide (REGLAN) injection 10 mg (10 mg Intravenous Given 05/18/21 1149)  alum & mag hydroxide-simeth (MAALOX/MYLANTA) 200-200-20 MG/5ML suspension 30 mL (30 mLs Oral Given 05/18/21 1138)    And  lidocaine (XYLOCAINE) 2 % viscous mouth solution 15 mL (15 mLs Oral Given 05/18/21 1138)  famotidine (PEPCID) IVPB 20 mg premix (20 mg Intravenous New Bag/Given 05/18/21 1153)  LORazepam (ATIVAN) injection 1 mg (1 mg Intravenous Given 05/18/21 1148)  magnesium sulfate IVPB  2 g 50 mL (2 g Intravenous New Bag/Given 05/18/21 1310)    ED Course  I have reviewed the triage vital signs and the nursing notes.  Pertinent labs & imaging results that were available during my care of the patient were reviewed by me and considered in my medical decision making (see chart for details).    MDM Rules/Calculators/A&P                          Patient presents for nausea, vomiting, diarrhea, and p.o. intolerance.  She also describes some flulike symptoms.  All of the symptoms have been present over the past 5 days.  On arrival, patient is tachycardic.  Oral mucosa is dry.  Diagnostic work-up was initiated.  Patient was given IV fluids, antiemetic, and GI cocktail.  Laboratory work-up showed hypokalemia.  Magnesium and potassium replacement were  given.  On reassessment, patient reported remarkable improvement in her symptoms.  She was able to tolerate p.o. intake.  She did feel comfortable going home at this time.  Urinalysis showed mild pyuria.  Given her modest urinalysis results in addition to her symptoms that are described as similar to previous episodes of cyclic vomiting, I do not suspect this is pyelonephritis.  However, given her recent symptoms of dysuria with some pyuria on urinalysis, patient was given empiric treatment for uncomplicated UTI. she was advised to follow-up with primary care doctor and to return to the ED at any time if she does experience recurrence of severe symptoms.  She was discharged in good condition.  Final Clinical Impression(s) / ED Diagnoses Final diagnoses:  Vomiting and diarrhea  Acute cystitis without hematuria    Rx / DC Orders ED Discharge Orders          Ordered    nitrofurantoin, macrocrystal-monohydrate, (MACROBID) 100 MG capsule  2 times daily        05/18/21 1429             Godfrey Pick, MD 05/19/21 [phone]

## 2021-05-18 NOTE — ED Triage Notes (Signed)
Pt arrives to ED with c/o emesis. This started x5 days ago. This is recurrent and she has seen GI for same on 11/1. Pt also reports diarrhea, which is chronic and recurrent for her.

## 2021-05-21 NOTE — Progress Notes (Deleted)
    SUBJECTIVE:   CHIEF COMPLAINT / HPI:   Needs BMP for reclast infusion paperwork.  PERTINENT  PMH / PSH: ***  OBJECTIVE:   There were no vitals taken for this visit.  ***  ASSESSMENT/PLAN:   No problem-specific Assessment & Plan notes found for this encounter.     [name], MD [place]

## 2021-05-22 ENCOUNTER — Other Ambulatory Visit: Payer: Self-pay

## 2021-05-22 ENCOUNTER — Ambulatory Visit: Payer: 59 | Admitting: Family Medicine

## 2021-05-22 ENCOUNTER — Telehealth: Payer: Self-pay | Admitting: Family Medicine

## 2021-05-22 ENCOUNTER — Encounter (HOSPITAL_COMMUNITY): Payer: Self-pay | Admitting: Emergency Medicine

## 2021-05-22 ENCOUNTER — Emergency Department (HOSPITAL_COMMUNITY): Payer: 59

## 2021-05-22 ENCOUNTER — Emergency Department (HOSPITAL_COMMUNITY)
Admission: EM | Admit: 2021-05-22 | Discharge: 2021-05-22 | Disposition: A | Discharge status: Home / Self Care | Payer: 59 | Attending: Emergency Medicine | Admitting: Emergency Medicine

## 2021-05-22 DIAGNOSIS — R109 Unspecified abdominal pain: Secondary | ICD-10-CM

## 2021-05-22 DIAGNOSIS — K2950 Unspecified chronic gastritis without bleeding: Secondary | ICD-10-CM | POA: Insufficient documentation

## 2021-05-22 DIAGNOSIS — K529 Noninfective gastroenteritis and colitis, unspecified: Secondary | ICD-10-CM

## 2021-05-22 DIAGNOSIS — F419 Anxiety disorder, unspecified: Secondary | ICD-10-CM

## 2021-05-22 DIAGNOSIS — I10 Essential (primary) hypertension: Secondary | ICD-10-CM

## 2021-05-22 DIAGNOSIS — K219 Gastro-esophageal reflux disease without esophagitis: Secondary | ICD-10-CM

## 2021-05-22 LAB — COMPREHENSIVE METABOLIC PANEL
ALT: 25 U/L (ref 0–44)
AST: 31 U/L (ref 15–41)
Albumin: 3.6 g/dL (ref 3.5–5.0)
Alkaline Phosphatase: 96 U/L (ref 38–126)
Anion gap: 14 (ref 5–15)
BUN: 9 mg/dL (ref 8–23)
CO2: 26 mmol/L (ref 22–32)
Calcium: 9.4 mg/dL (ref 8.9–10.3)
Chloride: 95 mmol/L — ABNORMAL LOW (ref 98–111)
Creatinine, Ser: 0.89 mg/dL (ref 0.44–1.00)
GFR, Estimated: >60 mL/min (ref >60)
Glucose, Bld: 121 mg/dL — ABNORMAL HIGH (ref 70–99)
Potassium: 3.1 mmol/L — ABNORMAL LOW (ref 3.5–5.1)
Sodium: 135 mmol/L (ref 135–145)
Total Bilirubin: 0.7 mg/dL (ref 0.3–1.2)
Total Protein: 6.9 g/dL (ref 6.5–8.1)

## 2021-05-22 LAB — CBC WITH DIFFERENTIAL/PLATELET
Abs Immature Granulocytes: 0.03 10*3/uL (ref 0.00–0.07)
Basophils Absolute: 0 10*3/uL (ref 0.0–0.1)
Basophils Relative: 1 %
Eosinophils Absolute: 0 10*3/uL (ref 0.0–0.5)
Eosinophils Relative: 1 %
HCT: 47.7 % — ABNORMAL HIGH (ref 36.0–46.0)
Hemoglobin: 16.7 g/dL — ABNORMAL HIGH (ref 12.0–15.0)
Immature Granulocytes: 0 %
Lymphocytes Relative: 23 %
Lymphs Abs: 1.9 10*3/uL (ref 0.7–4.0)
MCH: 31.3 pg (ref 26.0–34.0)
MCHC: 35 g/dL (ref 30.0–36.0)
MCV: 89.5 fL (ref 80.0–100.0)
Monocytes Absolute: 0.8 10*3/uL (ref 0.1–1.0)
Monocytes Relative: 10 %
Neutro Abs: 5.3 10*3/uL (ref 1.7–7.7)
Neutrophils Relative %: 65 %
Platelets: 208 10*3/uL (ref 150–400)
RBC: 5.33 MIL/uL — ABNORMAL HIGH (ref 3.87–5.11)
RDW: 12.3 % (ref 11.5–15.5)
WBC: 8 10*3/uL (ref 4.0–10.5)
nRBC: 0 % (ref 0.0–0.2)

## 2021-05-22 LAB — URINALYSIS, ROUTINE W REFLEX MICROSCOPIC
Glucose, UA: NEGATIVE mg/dL
Ketones, ur: NEGATIVE mg/dL
Leukocytes,Ua: NEGATIVE
Nitrite: NEGATIVE
Protein, ur: 30 mg/dL — AB
Specific Gravity, Urine: 1.02 (ref 1.005–1.030)
pH: 6 (ref 5.0–8.0)

## 2021-05-22 LAB — URINALYSIS, MICROSCOPIC (REFLEX): Bacteria, UA: NONE SEEN

## 2021-05-22 LAB — LIPASE, BLOOD: Lipase: 37 U/L (ref 11–51)

## 2021-05-22 LAB — MAGNESIUM: Magnesium: 1.8 mg/dL (ref 1.7–2.4)

## 2021-05-22 MED ORDER — DICYCLOMINE HCL 20 MG PO TABS: 20.0000 mg | ORAL_TABLET | Freq: Three times a day (TID) | ORAL | 0 refills | Status: DC | PRN

## 2021-05-22 MED ORDER — LORAZEPAM 2 MG/ML IJ SOLN
1.0000 mg | Freq: Once | INTRAMUSCULAR | Status: AC
  Administered 2021-05-22: 1 mg via INTRAVENOUS
  Filled 2021-05-22: qty 1

## 2021-05-22 MED ORDER — ALUM & MAG HYDROXIDE-SIMETH 200-200-20 MG/5ML PO SUSP
30.0000 mL | Freq: Once | ORAL | Status: AC
  Administered 2021-05-22: 30 mL via ORAL
  Filled 2021-05-22: qty 30

## 2021-05-22 MED ORDER — IOHEXOL 300 MG/ML  SOLN
100.0000 mL | Freq: Once | INTRAMUSCULAR | Status: AC | PRN
  Administered 2021-05-22: 100 mL via INTRAVENOUS

## 2021-05-22 MED ORDER — METRONIDAZOLE 500 MG PO TABS: 500.0000 mg | ORAL_TABLET | Freq: Three times a day (TID) | ORAL | 0 refills | Status: AC

## 2021-05-22 MED ORDER — METRONIDAZOLE 500 MG PO TABS
500.0000 mg | ORAL_TABLET | Freq: Once | ORAL | Status: AC
  Administered 2021-05-22: 500 mg via ORAL
  Filled 2021-05-22: qty 1

## 2021-05-22 MED ORDER — SODIUM CHLORIDE 0.9 % IV SOLN
12.5000 mg | Freq: Once | INTRAVENOUS | Status: AC
  Administered 2021-05-22: 12.5 mg via INTRAVENOUS
  Filled 2021-05-22: qty 0.5

## 2021-05-22 MED ORDER — CIPROFLOXACIN HCL 500 MG PO TABS
500.0000 mg | ORAL_TABLET | Freq: Once | ORAL | Status: AC
  Administered 2021-05-22: 500 mg via ORAL
  Filled 2021-05-22: qty 1

## 2021-05-22 MED ORDER — SODIUM CHLORIDE 0.9 % IV BOLUS
1000.0000 mL | Freq: Once | INTRAVENOUS | Status: AC
  Administered 2021-05-22: 1000 mL via INTRAVENOUS

## 2021-05-22 MED ORDER — POTASSIUM CHLORIDE ER 10 MEQ PO TBCR: 10.0000 meq | EXTENDED_RELEASE_TABLET | Freq: Every day | ORAL | 0 refills | Status: DC

## 2021-05-22 MED ORDER — CIPROFLOXACIN HCL 500 MG PO TABS: 500.0000 mg | ORAL_TABLET | Freq: Two times a day (BID) | ORAL | 0 refills | Status: AC

## 2021-05-22 MED ORDER — PROMETHAZINE HCL 25 MG RE SUPP: 25.0000 mg | Freq: Three times a day (TID) | RECTAL | 0 refills | Status: DC | PRN

## 2021-05-22 MED ORDER — CLONAZEPAM 0.5 MG PO TABS: 0.5000 mg | ORAL_TABLET | Freq: Three times a day (TID) | ORAL | 0 refills | Status: DC | PRN

## 2021-05-22 MED ORDER — HYDRALAZINE HCL 20 MG/ML IJ SOLN
10.0000 mg | Freq: Once | INTRAMUSCULAR | Status: AC
  Administered 2021-05-22: 10 mg via INTRAVENOUS
  Filled 2021-05-22: qty 1

## 2021-05-22 MED ORDER — ONDANSETRON 8 MG PO TBDP: 8.0000 mg | ORAL_TABLET | Freq: Three times a day (TID) | ORAL | 0 refills | Status: DC | PRN

## 2021-05-22 MED ORDER — LIDOCAINE VISCOUS HCL 2 % MT SOLN
15.0000 mL | Freq: Once | OROMUCOSAL | Status: AC
  Administered 2021-05-22: 15 mL via ORAL
  Filled 2021-05-22: qty 15

## 2021-05-22 MED ORDER — DICYCLOMINE HCL 10 MG PO CAPS
20.0000 mg | ORAL_CAPSULE | Freq: Once | ORAL | Status: AC
  Administered 2021-05-22: 20 mg via ORAL
  Filled 2021-05-22: qty 2

## 2021-05-22 MED ORDER — POTASSIUM CHLORIDE 10 MEQ/100ML IV SOLN
10.0000 meq | Freq: Once | INTRAVENOUS | Status: AC
  Administered 2021-05-22: 10 meq via INTRAVENOUS
  Filled 2021-05-22: qty 100

## 2021-05-22 MED ORDER — FAMOTIDINE IN NACL 20-0.9 MG/50ML-% IV SOLN
20.0000 mg | Freq: Once | INTRAVENOUS | Status: AC
  Administered 2021-05-22: 20 mg via INTRAVENOUS
  Filled 2021-05-22: qty 50

## 2021-05-22 NOTE — ED Provider Notes (Signed)
Emergency Medicine Provider Triage Evaluation Note  Karen Casey , a 64 y.o. female  was evaluated in triage.  Pt complains of abdominal pain with vomiting x 2-3 weeks. Located generalized abdomen.  Symptoms are intermittent.  Also reports diarrhea.  Review of Systems  Positive: Abdominal pain, nausea, vomiting Negative: Fevers, chills  Physical Exam  There were no vitals taken for this visit. Gen:   Awake, no distress   Resp:  Normal effort  MSK:   Moves extremities without difficulty  Other:    Medical Decision Making  Medically screening exam initiated at 6:32 AM.  Appropriate orders placed.  [name] was informed that the remainder of the evaluation will be completed by another provider, this initial triage assessment does not replace that evaluation, and the importance of remaining in the ED until their evaluation is complete.     Tacy Learn, PA-C 05/22/21 5997    Fatima Blank, MD 05/22/21 [phone]

## 2021-05-22 NOTE — Telephone Encounter (Signed)
Called patient, discussed she was in ED during the appointment this morning. We discussed that she is still having diarrhea at home, has not been drinking for past three weeks. Discussed that I will refill her Klonopin but we have to continue her taper. Discussed that we will decreased to 0.5mg  TID from 1 mg BID. Discussed that she cannot take more than three per day as prescribed. Discussed that if she does this it is a violation of her agreement with me and I will not be able to prescribe for her anymore. Discussed if she takes more than prescribed she will not get refilled early. She was in agreement to these terms. 28 days refilled of 0.5 mg Klonopin TID.  Discussed calling clinic back at her convenience to reschedule her appointment.  Yehuda Savannah MD

## 2021-05-22 NOTE — ED Notes (Signed)
ED Provider at bedside. 

## 2021-05-22 NOTE — ED Triage Notes (Signed)
Patient here with vomiting and abdominal pain.  She states that it has been going on for two weeks or more. She has chronic diarrhea.

## 2021-05-22 NOTE — ED Provider Notes (Signed)
[place] EMERGENCY DEPARTMENT Provider Note   CSN: [id number] Arrival date & time: 05/22/21  0631     History Chief Complaint  Patient presents with   Abdominal Pain    Karen Casey is a 65 y.o. female with history of reflux, gastritis, hypertension, anxiety, chronic alcohol use, presenting to emergency department with epigastric pain.  The patient reports that this is a flareup of her chronic epigastric pain.  She says she has not been able to keep down any food or fluids for about 2 weeks.  She reports that she takes Pepcid and Nexium, although she has had difficulty keeping down medications.  She says she was seen at the ER for similar presentation about a week ago and given a GI cocktail which improved her pain.  She does follow-up with [facility] gastroenterology.  Last upper endoscopy per our records was 01/04/21 noting some gastric antrum erosion, no report of ulcerations, stomach otherwise normal.  Her gastroenterologist repeatedly recommended that she stop drinking alcohol.  The patient reports that she has stopped drinking alcohol for "a long long time".  She does take Xanax for "anxiety."  HPI     Past Medical History:  Diagnosis Date   Alcohol abuse    last use 03/09/21, marijuana last 03/09/21   Allergy    Anxiety    Cataract 06/09/2012   Right eye and left eye   COPD (chronic obstructive pulmonary disease) (HCC)    Depression    Dyspnea    uses oxygen 2L via [place] prn   GERD (gastroesophageal reflux disease)    Headache    Hypertension    Neuromuscular disorder (HCC)    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   Seizure ([place])    08/2020 per patient   Seizures ([place])    xanax withdrawl- December 2013   Urinary incontinence 06/09/2012    Patient Active Problem List   Diagnosis Date Noted   Hepatic steatosis 01/05/2021   Chronic obstructive pulmonary disease, group B, by Global Initiative for Chronic Obstructive Lung Disease  2017 classification ([place]) 12/09/2020   Chronic pain of breast 11/12/2020   Compression fracture of body of thoracic vertebra ([place]) 11/06/2020   Drug withdrawal seizure with complication ([place]) 78/29/5621   SIADH (syndrome of inappropriate ADH production) ([place]) 10/12/2020   Other chest pain    Hypomagnesemia    Long term (current) use of anticoagulants    Protein-calorie malnutrition ([place]) 09/17/2020   Paroxysmal atrial fibrillation ([place])    Alcohol withdrawal ([place]) 09/10/2020   Pulmonary nodules/lesions, multiple 07/14/2020   Chronic left shoulder pain 06/22/2020   HTN (hypertension) 06/02/2018   Body mass index (BMI) 31.0-31.9, adult 03/29/2013   Diarrhea 10/20/2012   Anxiety disorder 06/12/2012   Alcohol use 05/28/2012   Benzodiazepine withdrawal without complication ([place]) 30/86/5784   Tobacco abuse 02/17/2012   GERD (gastroesophageal reflux disease) 02/04/2012   Depression 02/04/2012    Past Surgical History:  Procedure Laterality Date   APPENDECTOMY     BIOPSY  01/04/2021   Procedure: BIOPSY;  Surgeon: Yetta Flock, MD;  Location: [facility];  Service: Gastroenterology;;   BIOPSY  03/12/2021   Procedure: BIOPSY;  Surgeon: Irving Copas., MD;  Location: [facility];  Service: Gastroenterology;;   CATARACT EXTRACTION  06/09/2012   Left eye   COLONOSCOPY WITH PROPOFOL N/A 03/12/2021   Procedure: COLONOSCOPY WITH PROPOFOL;  Surgeon: Irving Copas., MD;  Location: [facility] ENDOSCOPY;  Service: Gastroenterology;  Laterality:  N/A;   ESOPHAGOGASTRODUODENOSCOPY (EGD) WITH PROPOFOL N/A 01/04/2021   Procedure: ESOPHAGOGASTRODUODENOSCOPY (EGD) WITH PROPOFOL;  Surgeon: Yetta Flock, MD;  Location: [facility];  Service: Gastroenterology;  Laterality: N/A;   left shoulder dislocation  Sept 2011   POLYPECTOMY  03/12/2021   Procedure: POLYPECTOMY;  Surgeon: Mansouraty, Telford Nab., MD;  Location: [facility];  Service: Gastroenterology;;     OB History   No  obstetric history on file.     Family History  Problem Relation Age of Onset   Hypertension Mother    Hyperlipidemia Mother    Aneurysm Mother        Rupture - Cause of death   Heart disease Father        MI - cause of death   Depression Father    Parkinsonism Father    Hypertension Sister    Colon cancer Neg Hx    Esophageal cancer Neg Hx    Rectal cancer Neg Hx    Stomach cancer Neg Hx     Social History   Tobacco Use   Smoking status: Every Day    Packs/day: 0.50    Years: 43.00    Pack years: 21.50    Types: Cigarettes   Smokeless tobacco: Never   Tobacco comments:    Tobacco info given  Vaping Use   Vaping Use: Never used  Substance Use Topics   Alcohol use: Yes    Alcohol/week: 3.0 standard drinks    Types: 3 Cans of beer per week    Comment: 2-3 times week just beer   Drug use: Not Currently    Types: Marijuana, Other-see comments    Comment: Past hx of benzo abuse--xanax    Home Medications Prior to Admission medications   Medication Sig Start Date End Date Taking? Authorizing Provider  ciprofloxacin (CIPRO) 500 MG tablet Take 1 tablet (500 mg total) by mouth every 12 (twelve) hours for 7 days. 05/22/21 05/29/21 Yes Kashira Behunin, Carola Rhine, MD  dicyclomine (BENTYL) 20 MG tablet Take 1 tablet (20 mg total) by mouth 3 (three) times daily as needed for up to 30 doses for spasms. 05/22/21  Yes Makena Mcgrady, Carola Rhine, MD  metroNIDAZOLE (FLAGYL) 500 MG tablet Take 1 tablet (500 mg total) by mouth 3 (three) times daily for 7 days. 05/22/21 05/29/21 Yes Cyler Kappes, Carola Rhine, MD  potassium chloride (KLOR-CON) 10 MEQ tablet Take 1 tablet (10 mEq total) by mouth daily for 30 doses. 05/22/21 06/21/21 Yes Geoffrey Mankin, Carola Rhine, MD  promethazine (PHENERGAN) 25 MG suppository Place 1 suppository (25 mg total) rectally every 8 (eight) hours as needed for up to 6 doses for nausea or vomiting. 05/22/21  Yes Wyvonnia Dusky, MD  acetaminophen (TYLENOL) 325 MG tablet Take 975 mg by mouth See admin  instructions. Take 975 mg daily, may take a second 975 mg dose as needed for pain    [provider]  albuterol (VENTOLIN HFA) 108 (90 Base) MCG/ACT inhaler TAKE 2 PUFFS BY MOUTH EVERY 6 HOURS AS NEEDED FOR WHEEZE OR SHORTNESS OF BREATH 03/16/21   [place], Celeste C, PA-C  amLODipine (NORVASC) 5 MG tablet TAKE 1 TABLET (5 MG TOTAL) BY MOUTH DAILY. 01/22/21 05/22/21  C[place], Celeste C, PA-C  CALCIUM-MAGNESIUM-ZINC PO Take 1 tablet by mouth daily.    [provider]  clonazePAM (KLONOPIN) 0.5 MG tablet Take 1 tablet (0.5 mg total) by mouth 3 (three) times daily as needed for up to 28 days for anxiety. Ok to fill on 05/22/21. Discussed  with patient will not refill early. 05/22/21 06/19/21  [name], MD  DULoxetine (CYMBALTA) 30 MG capsule TAKE 1 CAPSULE ONCE DAILY X 1 WEEK, THEN INCREASE TO 2 CAPSULES DAILY FOR A TOTAL OF 60MG  DAILY. 04/27/21   [name], MD  esomeprazole (NEXIUM) 40 MG capsule TAKE 1 CAPSULE BY MOUTH EVERY DAY AT NOON 03/26/21   [name], MD  feeding supplement (ENSURE ENLIVE / ENSURE PLUS) LIQD Take 237 mLs by mouth 2 (two) times daily between meals. 01/06/21   Alen Bleacher, MD  Fluticasone-Umeclidin-Vilant (TRELEGY ELLIPTA) 100-62.5-25 MCG/ACT AEPB Inhale 1 puff into the lungs daily. 04/22/21 05/22/21  [name], MD  gabapentin (NEURONTIN) 600 MG tablet Take 1 tablet (600 mg total) by mouth 2 (two) times daily. 03/24/21   [name], MD  hydrOXYzine (VISTARIL) 25 MG capsule TAKE 1 CAPSULE (25 MG TOTAL) BY MOUTH EVERY 6 (SIX) HOURS AS NEEDED. 05/06/21 06/05/21  [name], MD  Multiple Vitamins-Minerals (CENTRUM SILVER 50+WOMEN PO) Take 1 tablet by mouth daily.    [provider]  nicotine (NICODERM CQ - DOSED IN MG/24 HOURS) 21 mg/24hr patch Place 1 patch (21 mg total) onto the skin daily. 01/08/21   Zenia Resides, MD  nitrofurantoin, macrocrystal-monohydrate, (MACROBID) 100 MG capsule Take 1 capsule (100 mg total) by  mouth 2 (two) times daily. 05/18/21   Godfrey Pick, MD  NON FORMULARY O2 use as needed    [provider]  ondansetron (ZOFRAN-ODT) 8 MG disintegrating tablet Take 1 tablet (8 mg total) by mouth every 8 (eight) hours as needed for nausea or vomiting. 05/22/21   [name], MD  promethazine (PHENERGAN) 12.5 MG suppository Place 12.5 mg rectally every 4 (four) hours as needed for nausea/vomiting. 01/26/21   [provider]  promethazine (PHENERGAN) 12.5 MG tablet Take 1 tablet (12.5 mg total) by mouth every 8 (eight) hours as needed for nausea or vomiting. 04/16/21   Doran Stabler, MD  rifaximin (XIFAXAN) 550 MG TABS tablet Take 1 tablet (550 mg total) by mouth 2 (two) times daily. 04/28/21   Doran Stabler, MD    Allergies    Patient has no known allergies.  Review of Systems   Review of Systems  Constitutional:  Negative for chills and fever.  Eyes:  Negative for pain and visual disturbance.  Respiratory:  Negative for cough and shortness of breath.   Cardiovascular:  Negative for chest pain and palpitations.  Gastrointestinal:  Positive for abdominal pain, diarrhea, nausea and vomiting.  Musculoskeletal:  Positive for back pain. Negative for arthralgias.  Skin:  Negative for color change and rash.  Neurological:  Negative for syncope and light-headedness.  All other systems reviewed and are negative.  Physical Exam Updated Vital Signs BP (!) 163/89   Pulse (!) 103   Temp 97.9 F (36.6 C) (Oral)   Resp (!) 27   SpO2 94%   Physical Exam Constitutional:      General: She is not in acute distress. HENT:     Head: Normocephalic and atraumatic.  Eyes:     Conjunctiva/sclera: Conjunctivae normal.     Pupils: Pupils are equal, round, and reactive to light.  Cardiovascular:     Rate and Rhythm: Normal rate and regular rhythm.  Pulmonary:     Effort: Pulmonary effort is normal. No respiratory distress.  Abdominal:     General: There is no distension.      Tenderness: There is abdominal  tenderness in the epigastric area. Negative signs include Murphy's sign.  Skin:    General: Skin is warm and dry.  Neurological:     General: No focal deficit present.     Mental Status: She is alert. Mental status is at baseline.  Psychiatric:        Mood and Affect: Mood normal.        Behavior: Behavior normal.    ED Results / Procedures / Treatments   Labs (all labs ordered are listed, but only abnormal results are displayed) Labs Reviewed  CBC WITH DIFFERENTIAL/PLATELET - Abnormal; Notable for the following components:      Result Value   RBC 5.33 (*)    Hemoglobin 16.7 (*)    HCT 47.7 (*)    All other components within normal limits  COMPREHENSIVE METABOLIC PANEL - Abnormal; Notable for the following components:   Potassium 3.1 (*)    Chloride 95 (*)    Glucose, Bld 121 (*)    All other components within normal limits  URINALYSIS, ROUTINE W REFLEX MICROSCOPIC - Abnormal; Notable for the following components:   APPearance HAZY (*)    Hgb urine dipstick TRACE (*)    Bilirubin Urine SMALL (*)    Protein, ur 30 (*)    All other components within normal limits  LIPASE, BLOOD  MAGNESIUM  URINALYSIS, MICROSCOPIC (REFLEX)    EKG EKG Interpretation  Date/Time:  Friday May 22 2021 32:67:12 EST Ventricular Rate:  109 PR Interval:  124 QRS Duration: 68 QT Interval:  366 QTC Calculation: 492 R Axis:   -19 Text Interpretation: Sinus tachycardia with Premature atrial complexes Minimal voltage criteria for LVH, may be normal variant ( R in aVL ) Nonspecific ST abnormality Abnormal ECG Confirmed by Octaviano Glow [phone]) on 05/22/2021 8:12:21 AM  Radiology CT ABDOMEN PELVIS W CONTRAST  Result Date: 05/22/2021 CLINICAL DATA:  Abdominal pain for 3 days. EXAM: CT ABDOMEN AND PELVIS WITH CONTRAST TECHNIQUE: Multidetector CT imaging of the abdomen and pelvis was performed using the standard protocol following bolus administration of  intravenous contrast. CONTRAST:  129mL OMNIPAQUE IOHEXOL 300 MG/ML  SOLN COMPARISON:  Prior CT of the abdomen and pelvis on 01/02/2021 FINDINGS: Lower chest: No acute abnormality. Hepatobiliary: No focal liver abnormality is seen. No gallstones, gallbladder wall thickening, or biliary dilatation. Pancreas: Unremarkable. No pancreatic ductal dilatation or surrounding inflammatory changes. Spleen: Normal in size without focal abnormality. Adrenals/Urinary Tract: Adrenal glands are unremarkable. Kidneys are normal, without renal calculi, focal lesion, or hydronephrosis. Bladder is unremarkable. Stomach/Bowel: Bowel shows no evidence of obstruction or ileus. There is some fluid scattered throughout much of the colon and multiple small bowel loops which may be consistent with enteritis. No bowel wall thickening, focal lesion or evidence of free intraperitoneal air. Vascular/Lymphatic: Mild atherosclerosis of the abdominal aorta without evidence of aneurysm. No enlarged abdominal or pelvic lymph nodes identified. Reproductive: Uterus and bilateral adnexa are unremarkable. Other: No abdominal wall hernia or abnormality. No abdominopelvic ascites. Musculoskeletal: Degenerative disc disease at L5-S1. No bony lesions. IMPRESSION: There is some fluid scattered in nondistended small bowel and colon which may be consistent with enteritis. Electronically Signed   By: Aletta Edouard M.D.   On: 05/22/2021 09:57    Procedures Procedures   Medications Ordered in ED Medications  sodium chloride 0.9 % bolus 1,000 mL (0 mLs Intravenous Stopped 05/22/21 1017)  potassium chloride 10 mEq in 100 mL IVPB (0 mEq Intravenous Stopped 05/22/21 1017)  alum & mag hydroxide-simeth (MAALOX/MYLANTA)  200-200-20 MG/5ML suspension 30 mL (30 mLs Oral Given 05/22/21 0857)    And  lidocaine (XYLOCAINE) 2 % viscous mouth solution 15 mL (15 mLs Oral Given 05/22/21 0857)  famotidine (PEPCID) IVPB 20 mg premix (0 mg Intravenous Stopped 05/22/21 1058)   hydrALAZINE (APRESOLINE) injection 10 mg (10 mg Intravenous Given 05/22/21 0856)  LORazepam (ATIVAN) injection 1 mg (1 mg Intravenous Given 05/22/21 0857)  iohexol (OMNIPAQUE) 300 MG/ML solution 100 mL (100 mLs Intravenous Contrast Given 05/22/21 0934)  ciprofloxacin (CIPRO) tablet 500 mg (500 mg Oral Given 05/22/21 1147)  metroNIDAZOLE (FLAGYL) tablet 500 mg (500 mg Oral Given 05/22/21 1146)  promethazine (PHENERGAN) 12.5 mg in sodium chloride 0.9 % 50 mL IVPB (0 mg Intravenous Stopped 05/22/21 1126)  dicyclomine (BENTYL) capsule 20 mg (20 mg Oral Given 05/22/21 1147)    ED Course  I have reviewed the triage vital signs and the nursing notes.  Pertinent labs & imaging results that were available during my care of the patient were reviewed by me and considered in my medical decision making (see chart for details).  Differential diagnoses include gastric ulcer versus gastritis versus pancreatitis versus other.  I personally reviewed and interpreted the patient's prior medical records, as well as her imaging and blood test today.  EKG per my interpretation shows sinus rhythm without acute ischemic findings.  I doubt this is atypical ACS.  Her hemoglobin and white blood cell count are within normal limits.  Lipase is normal.  Lower suspicion for acute pancreatitis.  We can try GI cocktail, some IV fluids.  Her potassium is low at 3.1, will give IV potassium here as well.  She says she missed her morning Xanax, we can give her some Ativan through her IV, as well as Pepcid for her stomach.  I strongly suspect this is related to her chronic gastritis.  However per my review of her prior imaging, she does also have a history of diverticulosis.  She was reporting profuse and worsening diarrhea.  Given that her abdominal exam is nonlocalizing, I do think a CT scan to rule out diverticulitis or colitis would be reasonable.  Clinical Course as of 05/22/21 1719  Fri May 22, 2021  0838 Patient reports she  was unable to take her morning blood pressure medicine which includes hydralazine.  I will give her a dose of IV hydralazine that she does not feel she can keep down pills at this time.  BP 170/110 mm in the room [MT]  1024 I reassessed the patient explained her work-up is largely unremarkable.  She does have signs of an enteritis, given her diarrhea, she would prefer to try antibiotics for this.  I explained that this may worsen her stomach pain or diarrhea but we will proceed with treatment, starting her on ciprofloxacin and Flagyl.  We will also give her Bentyl for cramping abdominal pain as well as some IV Phenergan as she reports continued nausea. [MT]  1024 Patient reports he becomes very difficult historian at this point.  She is trembling in the room.  She reports she is dealing with "extreme anxiety."  She says she is nauseated, hungry, that her body hurts all over, that she think she does not have a seizure.  I suspect a lot of this is being driven by her anxiety.  I empathized that she has been feeling chronically ill for several months, would advise that she follow-up with her GI doctor regarding her ongoing stomach issues.  She does not meet inpatient  hospitalization criteria by her tests or clinically, as she can tolerate PO. [MT]  1208 Patient readily and easily tolerated p.o. here.  She remains quite tearful, appears extremely anxious.  I explained that if she can tolerate this medication she should be able to tolerate her medicines at home. [MT]    Clinical Course User Index [MT] Jalana Moore, Carola Rhine, MD    Final Clinical Impression(s) / ED Diagnoses Final diagnoses:  Abdominal pain, unspecified abdominal location  Enteritis  Chronic gastritis without bleeding, unspecified gastritis type    Rx / DC Orders ED Discharge Orders          Ordered    dicyclomine (BENTYL) 20 MG tablet  3 times daily PRN        05/22/21 1215    ciprofloxacin (CIPRO) 500 MG tablet  Every 12 hours         05/22/21 1215    metroNIDAZOLE (FLAGYL) 500 MG tablet  3 times daily        05/22/21 1215    potassium chloride (KLOR-CON) 10 MEQ tablet  Daily        05/22/21 1215    promethazine (PHENERGAN) 25 MG suppository  Every 8 hours PRN        05/22/21 1215             Wyvonnia Dusky, MD 05/22/21 1719

## 2021-05-22 NOTE — Discharge Instructions (Addendum)
You have gastritis and enteritis.  This is inflammation of your stomach and your bowels.  This can be caused by infections.  I decided to start you on antibiotics for a possible bacterial infection.  You will take these antibiotics, ciprofloxacin and Flagyl, as prescribed for a week.  Try to take these with food.  It is extremely important you do not drink any alcohol while taking his medicines.  This will make you very sick.  For your cramping stomach pain, I prescribed you Bentyl which you can take 3 times per day AS NEEDED.  I also prescribed rectal Phenergan again, which is a suppository you can use to help with the nausea.  Your potassium level was also low at 3.1 today.  I prescribed you potassium pills to take at home for the next 30 days for this issue.  Take 1 a day.  Finally, is important you follow-up with your gastroenterologist.  Please call their office to let them know that you have been to the ER twice for these stomach problems.  They should schedule you for follow-up appointment.

## 2021-05-22 NOTE — Telephone Encounter (Signed)
Patient returns call to nurse line asking to speak with Dr. Thompson Grayer as soon as possible. Informed patient that provider is in clinic this morning and I will relay the message. Patient is currently in the ED at [facility].   Please advise.  Talbot Grumbling, RN

## 2021-05-25 NOTE — Progress Notes (Signed)
SUBJECTIVE:   CHIEF COMPLAINT / HPI:   GAD- last week weaned to Klonopin 0.5mg  TID PRN. Tried Cymbalta and unable to tolerate.  Notes sometimes that she takes 4 of the 0.5 Klonopin's.  Is open to seeing a new psychiatrist.  Osteoporosis- IV Reclast paperwork filled out and faxed.  She has severe GERD and I suspect will not tolerate an oral bisphosphonate.  Hypertension-she believes she only has a few left of her amlodipine 5 mg, believes she took it this morning.  Does note a headache for the last 3 weeks.  Headache-she notes that she has sensitivity to light and sound she has been drinking plenty of water she has taken over-the-counter Tylenol which has not helped.  No numbness or weakness, no vision changes, no vomiting.  She is wondering if there is something she can get for headache today.  Spot on skin-she has a spot on her right upper lip, she notes it has been growing.  It does not itch or bleed.  She is wondering if this can be removed.  Low back pain-she notes her stopped her gabapentin and then noticed worsening paresthesias, she has restarted it today.  She notes she has trouble getting around and is open to physical therapy.  She notes she has not had diarrhea or abdominal pain in the past week.  She has been abstinent from alcohol.  She is wondering if she can have a handicap placard.  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 Neuro: Normal gait, moves all four extremities appropriately.  Cranial nerves II through XII grossly intact Skin: Raised dark  stuck on 0.5 cm lesion on right upper lip, similar raised dark lesion on right lower back with a stuck on appearance Psych: Appropriate mood and affect   ASSESSMENT/PLAN:   Anxiety disorder - Discussed today that we are continuing with our wean she is down to Klonopin 0.5 mg 3 times daily and we will leave her on this for the next month or 2, discussed importance of getting in with psychiatry as I cannot continue to prescribe this long-term, she has been abstinent from alcohol and I have congratulated her - Denies SI, has previously offered counseling and has not felt this is helpful in the past - Referral to psychiatry placed today  Depression - Did not tolerate Cymbalta, will continue Seroquel 100 mg nightly  Headache - Potentially migrainous in nature with the photo and phonophobia, she has been drinking water, one-time dose of IM Toradol and p.o. Phenergan given today as she had a ride, discussed going home to lay down in a dark room - No red flag signs or symptoms, discussed reasons to go to the ER, no vomiting, no seizure-like activity  Alcohol use - Continues to be abstinent congratulated patient working on getting into psychiatry for better treatment of anxiety as above above  Seborrheic keratoses - Noted on right upper lip, benign in appearance, discussed we can remove this for potential for scarring, offered referral to dermatology, she would like  to wait at this time and consider in January     [name], MD [place]

## 2021-05-29 ENCOUNTER — Encounter: Payer: Self-pay | Admitting: Family Medicine

## 2021-05-29 ENCOUNTER — Ambulatory Visit (INDEPENDENT_AMBULATORY_CARE_PROVIDER_SITE_OTHER): Payer: 59 | Admitting: Family Medicine

## 2021-05-29 ENCOUNTER — Other Ambulatory Visit: Payer: Self-pay

## 2021-05-29 VITALS — BP 138/76 | HR 56 | Ht 63.0 in | Wt 170.5 lb

## 2021-05-29 DIAGNOSIS — I10 Essential (primary) hypertension: Secondary | ICD-10-CM

## 2021-05-29 DIAGNOSIS — F419 Anxiety disorder, unspecified: Secondary | ICD-10-CM | POA: Diagnosis not present

## 2021-05-29 DIAGNOSIS — G43009 Migraine without aura, not intractable, without status migrainosus: Secondary | ICD-10-CM | POA: Diagnosis not present

## 2021-05-29 DIAGNOSIS — F32A Depression, unspecified: Secondary | ICD-10-CM

## 2021-05-29 DIAGNOSIS — G44201 Tension-type headache, unspecified, intractable: Secondary | ICD-10-CM

## 2021-05-29 DIAGNOSIS — S22000A Wedge compression fracture of unspecified thoracic vertebra, initial encounter for closed fracture: Secondary | ICD-10-CM

## 2021-05-29 DIAGNOSIS — R519 Headache, unspecified: Secondary | ICD-10-CM | POA: Insufficient documentation

## 2021-05-29 DIAGNOSIS — L821 Other seborrheic keratosis: Secondary | ICD-10-CM

## 2021-05-29 DIAGNOSIS — Z789 Other specified health status: Secondary | ICD-10-CM

## 2021-05-29 MED ORDER — AMLODIPINE BESYLATE 5 MG PO TABS: 5.0000 mg | ORAL_TABLET | Freq: Every day | ORAL | 1 refills | Status: DC

## 2021-05-29 MED ORDER — PROMETHAZINE HCL 25 MG PO TABS
25.0000 mg | ORAL_TABLET | Freq: Once | ORAL | Status: AC
Start: 2021-05-29 — End: 2021-05-29
  Administered 2021-05-29: 25 mg via ORAL

## 2021-05-29 MED ORDER — QUETIAPINE FUMARATE 100 MG PO TABS: 100.0000 mg | ORAL_TABLET | Freq: Every day | ORAL | 2 refills | Status: DC

## 2021-05-29 MED ORDER — KETOROLAC TROMETHAMINE 30 MG/ML IJ SOLN
30.0000 mg | Freq: Once | INTRAMUSCULAR | Status: AC
Start: 2021-05-29 — End: 2021-05-29
  Administered 2021-05-29: 15 mg via INTRAMUSCULAR

## 2021-05-29 NOTE — Assessment & Plan Note (Signed)
-   Did not tolerate Cymbalta, will continue Seroquel 100 mg nightly

## 2021-05-29 NOTE — Assessment & Plan Note (Signed)
-   Noted on right upper lip, benign in appearance, discussed we can remove this for potential for scarring, offered referral to dermatology, she would like to wait at this time and consider in January

## 2021-05-29 NOTE — Assessment & Plan Note (Signed)
-   Continues to be abstinent congratulated patient working on getting into psychiatry for better treatment of anxiety as above above

## 2021-05-29 NOTE — Assessment & Plan Note (Addendum)
-   Potentially migrainous in nature with the photo and phonophobia, she has been drinking water, one-time dose of IM Toradol and p.o. Phenergan given today as she had a ride, discussed going home to lay down in a dark room - No red flag signs or symptoms, discussed reasons to go to the ER, no vomiting, no seizure-like activity

## 2021-05-29 NOTE — Assessment & Plan Note (Signed)
-   Discussed today that we are continuing with our wean she is down to Klonopin 0.5 mg 3 times daily and we will leave her on this for the next month or 2, discussed importance of getting in with psychiatry as I cannot continue to prescribe this long-term, she has been abstinent from alcohol and I have congratulated her - Denies SI, has previously offered counseling and has not felt this is helpful in the past - Referral to psychiatry placed today

## 2021-05-29 NOTE — Patient Instructions (Signed)
It was wonderful to see you today.  Please bring ALL of your medications with you to every visit.   Today we talked about:  - We gave you IM toradol 15mg  and PO Phenergan 25 mg - refilled your amlodipine for high blood pressure and seroquel for sleep - referral to psychiatry, and physical therapy  F/u appt in 2 months make an appt on your way out   Thank you for choosing [facility].   Please call [phone] with any questions about today's appointment.  Please be sure to schedule follow up at the front  desk before you leave today.   Yehuda Savannah, MD  Family Medicine

## 2021-05-30 ENCOUNTER — Other Ambulatory Visit: Payer: Self-pay | Admitting: Family Medicine

## 2021-05-30 DIAGNOSIS — F419 Anxiety disorder, unspecified: Secondary | ICD-10-CM

## 2021-06-02 ENCOUNTER — Telehealth: Payer: Self-pay

## 2021-06-02 NOTE — Telephone Encounter (Signed)
Patient calls nurse line requesting to speak with Dr. Thompson Grayer regarding medication management.   Patient is requesting PO pain medication that would be similar to injection she received on 12/16. Patient reports receiving phenergan injection, however, this appears to be administered by mouth. Advised patient she received IM toradol for pain. Patient reports that pain continues to be at 7/10. Nausea has not improved.   Patient would also like to discuss dosage decreased of Clonazepam. Patient reports that "this is not working and we are going to have to figure out something else."  Please advise.   Talbot Grumbling, RN

## 2021-06-06 ENCOUNTER — Other Ambulatory Visit: Payer: Self-pay | Admitting: Family Medicine

## 2021-06-07 ENCOUNTER — Other Ambulatory Visit: Payer: Self-pay | Admitting: Family Medicine

## 2021-06-07 DIAGNOSIS — J449 Chronic obstructive pulmonary disease, unspecified: Secondary | ICD-10-CM

## 2021-06-11 ENCOUNTER — Telehealth: Payer: Self-pay

## 2021-06-11 NOTE — Telephone Encounter (Signed)
Patient calls nurse line reporting bilateral leg swelling since last Friday. Patient reports they feel numb and they "ache," however not painful. Patient denies any chest pain or "out of the ordinary" SOB. Patient is speaking in full sentences and is understandable.  Patient advised to go to an UC to be evaluated. Patient reports she has an apt with PCP on 1/10. Patient is requesting a diuretic in the meantime.   Discussed red flags with patients and reasons to call EMS.   Will forward to PCP.

## 2021-06-11 NOTE — Telephone Encounter (Signed)
Called and recommended being seen at urgent care given symptomatology and sudden onset.  Patient reports she will think about this.  I discussed the risks.  All questions answered.  She will call back if new concerns.

## 2021-06-17 ENCOUNTER — Ambulatory Visit: Payer: 59 | Admitting: Podiatry

## 2021-06-17 ENCOUNTER — Telehealth: Payer: Self-pay

## 2021-06-17 NOTE — Telephone Encounter (Signed)
Called patient. Reports weakness in both legs and that she is having difficulty standing.   Asked patient about incontinence. Patient reports that she always has bladder control issues and has been having loose stools.   Due to patient's history it was recommended that she be evaluated in the ED today as we do not have any appointments. Patient verbalizes understanding.   Talbot Grumbling, RN

## 2021-06-17 NOTE — Telephone Encounter (Signed)
Patient calls nurse line regarding complaints with bilateral numbness and tingling in feet for the last five to six days. It appears that patient was originally supposed to see podiatrist today, however, had to reschedule for 06/29/21.  Patient is requesting PCP recommendations for concern.   Patient also has follow up appointment with PCP on 06/23/21.  Please advise.   Talbot Grumbling, RN

## 2021-06-18 ENCOUNTER — Other Ambulatory Visit: Payer: Self-pay | Admitting: Family Medicine

## 2021-06-18 DIAGNOSIS — F419 Anxiety disorder, unspecified: Secondary | ICD-10-CM

## 2021-06-19 DIAGNOSIS — R0902 Hypoxemia: Secondary | ICD-10-CM | POA: Diagnosis not present

## 2021-06-21 ENCOUNTER — Observation Stay (HOSPITAL_COMMUNITY): Payer: Medicare Other

## 2021-06-21 ENCOUNTER — Encounter (HOSPITAL_COMMUNITY): Payer: Self-pay

## 2021-06-21 ENCOUNTER — Inpatient Hospital Stay (HOSPITAL_COMMUNITY)
Admission: EM | Admit: 2021-06-21 | Discharge: 2021-06-24 | DRG: 641 | Disposition: A | Discharge status: Home / Self Care | Payer: Medicare Other | Attending: Family Medicine | Admitting: Family Medicine

## 2021-06-21 ENCOUNTER — Other Ambulatory Visit (HOSPITAL_COMMUNITY): Payer: Self-pay

## 2021-06-21 ENCOUNTER — Other Ambulatory Visit: Payer: Self-pay

## 2021-06-21 DIAGNOSIS — I10 Essential (primary) hypertension: Secondary | ICD-10-CM | POA: Diagnosis not present

## 2021-06-21 DIAGNOSIS — G609 Hereditary and idiopathic neuropathy, unspecified: Secondary | ICD-10-CM

## 2021-06-21 DIAGNOSIS — Z79899 Other long term (current) drug therapy: Secondary | ICD-10-CM

## 2021-06-21 DIAGNOSIS — M810 Age-related osteoporosis without current pathological fracture: Secondary | ICD-10-CM | POA: Diagnosis present

## 2021-06-21 DIAGNOSIS — Z20822 Contact with and (suspected) exposure to covid-19: Secondary | ICD-10-CM | POA: Diagnosis present

## 2021-06-21 DIAGNOSIS — E876 Hypokalemia: Secondary | ICD-10-CM

## 2021-06-21 DIAGNOSIS — R300 Dysuria: Secondary | ICD-10-CM | POA: Diagnosis present

## 2021-06-21 DIAGNOSIS — E871 Hypo-osmolality and hyponatremia: Secondary | ICD-10-CM

## 2021-06-21 DIAGNOSIS — J9611 Chronic respiratory failure with hypoxia: Secondary | ICD-10-CM | POA: Diagnosis present

## 2021-06-21 DIAGNOSIS — I480 Paroxysmal atrial fibrillation: Secondary | ICD-10-CM | POA: Diagnosis present

## 2021-06-21 DIAGNOSIS — G629 Polyneuropathy, unspecified: Secondary | ICD-10-CM | POA: Insufficient documentation

## 2021-06-21 DIAGNOSIS — J449 Chronic obstructive pulmonary disease, unspecified: Secondary | ICD-10-CM | POA: Diagnosis present

## 2021-06-21 DIAGNOSIS — R1084 Generalized abdominal pain: Secondary | ICD-10-CM | POA: Diagnosis not present

## 2021-06-21 DIAGNOSIS — G9341 Metabolic encephalopathy: Secondary | ICD-10-CM

## 2021-06-21 DIAGNOSIS — F10930 Alcohol use, unspecified with withdrawal, uncomplicated: Principal | ICD-10-CM

## 2021-06-21 DIAGNOSIS — R0602 Shortness of breath: Secondary | ICD-10-CM

## 2021-06-21 DIAGNOSIS — K219 Gastro-esophageal reflux disease without esophagitis: Secondary | ICD-10-CM | POA: Diagnosis present

## 2021-06-21 DIAGNOSIS — G8929 Other chronic pain: Secondary | ICD-10-CM | POA: Diagnosis present

## 2021-06-21 DIAGNOSIS — R202 Paresthesia of skin: Secondary | ICD-10-CM | POA: Diagnosis present

## 2021-06-21 DIAGNOSIS — Z743 Need for continuous supervision: Secondary | ICD-10-CM | POA: Diagnosis not present

## 2021-06-21 DIAGNOSIS — R4781 Slurred speech: Secondary | ICD-10-CM | POA: Diagnosis not present

## 2021-06-21 DIAGNOSIS — F32A Depression, unspecified: Secondary | ICD-10-CM | POA: Diagnosis present

## 2021-06-21 DIAGNOSIS — F17210 Nicotine dependence, cigarettes, uncomplicated: Secondary | ICD-10-CM | POA: Diagnosis present

## 2021-06-21 DIAGNOSIS — R0902 Hypoxemia: Secondary | ICD-10-CM | POA: Diagnosis not present

## 2021-06-21 LAB — COMPREHENSIVE METABOLIC PANEL
ALT: 14 U/L (ref 0–44)
AST: 39 U/L (ref 15–41)
Albumin: 2.6 g/dL — ABNORMAL LOW (ref 3.5–5.0)
Alkaline Phosphatase: 72 U/L (ref 38–126)
Anion gap: 17 — ABNORMAL HIGH (ref 5–15)
BUN: <5 mg/dL — ABNORMAL LOW (ref 8–23)
CO2: 29 mmol/L (ref 22–32)
Calcium: 5.4 mg/dL — CL (ref 8.9–10.3)
Chloride: 86 mmol/L — ABNORMAL LOW (ref 98–111)
Creatinine, Ser: 0.94 mg/dL (ref 0.44–1.00)
GFR, Estimated: >60 mL/min (ref >60)
Glucose, Bld: 119 mg/dL — ABNORMAL HIGH (ref 70–99)
Potassium: 3.1 mmol/L — ABNORMAL LOW (ref 3.5–5.1)
Sodium: 132 mmol/L — ABNORMAL LOW (ref 135–145)
Total Bilirubin: 1.1 mg/dL (ref 0.3–1.2)
Total Protein: 5 g/dL — ABNORMAL LOW (ref 6.5–8.1)

## 2021-06-21 LAB — RESP PANEL BY RT-PCR (FLU A&B, COVID) ARPGX2
Influenza A by PCR: NEGATIVE
Influenza B by PCR: NEGATIVE
SARS Coronavirus 2 by RT PCR: NEGATIVE

## 2021-06-21 LAB — CBC
HCT: 36 % (ref 36.0–46.0)
Hemoglobin: 12.3 g/dL (ref 12.0–15.0)
MCH: 30.5 pg (ref 26.0–34.0)
MCHC: 34.2 g/dL (ref 30.0–36.0)
MCV: 89.3 fL (ref 80.0–100.0)
Platelets: 184 10*3/uL (ref 150–400)
RBC: 4.03 MIL/uL (ref 3.87–5.11)
RDW: 14.4 % (ref 11.5–15.5)
WBC: 6 10*3/uL (ref 4.0–10.5)
nRBC: 0 % (ref 0.0–0.2)

## 2021-06-21 LAB — RENAL FUNCTION PANEL
Albumin: 2.7 g/dL — ABNORMAL LOW (ref 3.5–5.0)
Anion gap: 16 — ABNORMAL HIGH (ref 5–15)
BUN: <5 mg/dL — ABNORMAL LOW (ref 8–23)
CO2: 27 mmol/L (ref 22–32)
Calcium: 6.5 mg/dL — ABNORMAL LOW (ref 8.9–10.3)
Chloride: 87 mmol/L — ABNORMAL LOW (ref 98–111)
Creatinine, Ser: 1.1 mg/dL — ABNORMAL HIGH (ref 0.44–1.00)
GFR, Estimated: 56 mL/min — ABNORMAL LOW (ref >60)
Glucose, Bld: 108 mg/dL — ABNORMAL HIGH (ref 70–99)
Phosphorus: 3.1 mg/dL (ref 2.5–4.6)
Potassium: 3.5 mmol/L (ref 3.5–5.1)
Sodium: 130 mmol/L — ABNORMAL LOW (ref 135–145)

## 2021-06-21 LAB — DIFFERENTIAL
Abs Immature Granulocytes: 0.02 10*3/uL (ref 0.00–0.07)
Basophils Absolute: 0 10*3/uL (ref 0.0–0.1)
Basophils Relative: 1 %
Eosinophils Absolute: 0.1 10*3/uL (ref 0.0–0.5)
Eosinophils Relative: 1 %
Immature Granulocytes: 0 %
Lymphocytes Relative: 24 %
Lymphs Abs: 1.4 10*3/uL (ref 0.7–4.0)
Monocytes Absolute: 0.5 10*3/uL (ref 0.1–1.0)
Monocytes Relative: 9 %
Neutro Abs: 4 10*3/uL (ref 1.7–7.7)
Neutrophils Relative %: 65 %

## 2021-06-21 LAB — PROTIME-INR
INR: 1.3 — ABNORMAL HIGH (ref 0.8–1.2)
Prothrombin Time: 16.1 seconds — ABNORMAL HIGH (ref 11.4–15.2)

## 2021-06-21 LAB — LIPASE, BLOOD: Lipase: 30 U/L (ref 11–51)

## 2021-06-21 LAB — SEDIMENTATION RATE: Sed Rate: 3 mm/hr (ref 0–22)

## 2021-06-21 LAB — APTT: aPTT: 30 seconds (ref 24–36)

## 2021-06-21 LAB — MAGNESIUM
Magnesium: <0.5 mg/dL — CL (ref 1.7–2.4)
Magnesium: 2.7 mg/dL — ABNORMAL HIGH (ref 1.7–2.4)

## 2021-06-21 LAB — CBG MONITORING, ED: Glucose-Capillary: 120 mg/dL — ABNORMAL HIGH (ref 70–99)

## 2021-06-21 MED ORDER — MAGNESIUM SULFATE 2 GM/50ML IV SOLN
2.0000 g | Freq: Once | INTRAVENOUS | Status: AC
  Administered 2021-06-21: 2 g via INTRAVENOUS
  Filled 2021-06-21: qty 50

## 2021-06-21 MED ORDER — GERHARDT'S BUTT CREAM
TOPICAL_CREAM | Freq: Two times a day (BID) | CUTANEOUS | Status: DC
  Administered 2021-06-21: 1 via TOPICAL
  Filled 2021-06-21: qty 1

## 2021-06-21 MED ORDER — AMLODIPINE BESYLATE 5 MG PO TABS
5.0000 mg | ORAL_TABLET | Freq: Every day | ORAL | Status: DC
  Administered 2021-06-21 – 2021-06-24 (×4): 5 mg via ORAL
  Filled 2021-06-21 (×4): qty 1

## 2021-06-21 MED ORDER — BARRIER CREAM NON-SPECIFIED: 1.0000 "application " | TOPICAL_CREAM | Freq: Two times a day (BID) | TOPICAL | Status: DC | PRN

## 2021-06-21 MED ORDER — LORAZEPAM 1 MG PO TABS
0.0000 mg | ORAL_TABLET | Freq: Four times a day (QID) | ORAL | Status: AC
  Administered 2021-06-21 (×2): 1 mg via ORAL
  Administered 2021-06-21: 2 mg via ORAL
  Administered 2021-06-22: 1 mg via ORAL
  Administered 2021-06-22: 2 mg via ORAL
  Administered 2021-06-22: 1 mg via ORAL
  Filled 2021-06-21 (×3): qty 1
  Filled 2021-06-21: qty 2
  Filled 2021-06-21: qty 1
  Filled 2021-06-21: qty 2

## 2021-06-21 MED ORDER — POTASSIUM CHLORIDE CRYS ER 20 MEQ PO TBCR
40.0000 meq | EXTENDED_RELEASE_TABLET | Freq: Once | ORAL | Status: AC
  Administered 2021-06-21: 40 meq via ORAL
  Filled 2021-06-21: qty 2

## 2021-06-21 MED ORDER — UMECLIDINIUM-VILANTEROL 62.5-25 MCG/ACT IN AEPB
1.0000 | INHALATION_SPRAY | Freq: Every day | RESPIRATORY_TRACT | Status: DC
  Administered 2021-06-21 – 2021-06-24 (×4): 1 via RESPIRATORY_TRACT
  Filled 2021-06-21: qty 14

## 2021-06-21 MED ORDER — CALCIUM GLUCONATE-NACL 1-0.675 GM/50ML-% IV SOLN
1.0000 g | Freq: Once | INTRAVENOUS | Status: AC
  Administered 2021-06-21: 1000 mg via INTRAVENOUS
  Filled 2021-06-21: qty 50

## 2021-06-21 MED ORDER — NICOTINE 21 MG/24HR TD PT24
21.0000 mg | MEDICATED_PATCH | Freq: Every day | TRANSDERMAL | Status: DC
  Administered 2021-06-21 – 2021-06-24 (×4): 21 mg via TRANSDERMAL
  Filled 2021-06-21 (×4): qty 1

## 2021-06-21 MED ORDER — QUETIAPINE FUMARATE 100 MG PO TABS
100.0000 mg | ORAL_TABLET | Freq: Every day | ORAL | Status: DC
  Administered 2021-06-21 – 2021-06-23 (×3): 100 mg via ORAL
  Filled 2021-06-21 (×4): qty 1

## 2021-06-21 MED ORDER — PANTOPRAZOLE SODIUM 40 MG PO TBEC
80.0000 mg | DELAYED_RELEASE_TABLET | Freq: Every day | ORAL | Status: DC
  Administered 2021-06-21 – 2021-06-23 (×4): 80 mg via ORAL
  Filled 2021-06-21 (×4): qty 2

## 2021-06-21 MED ORDER — CLONAZEPAM 0.5 MG PO TABS
0.5000 mg | ORAL_TABLET | Freq: Three times a day (TID) | ORAL | Status: DC | PRN
  Administered 2021-06-21 – 2021-06-22 (×3): 0.5 mg via ORAL
  Filled 2021-06-21 (×3): qty 1

## 2021-06-21 MED ORDER — LORAZEPAM 1 MG PO TABS: 0.0000 mg | ORAL_TABLET | Freq: Two times a day (BID) | ORAL | Status: DC

## 2021-06-21 MED ORDER — LORAZEPAM 2 MG/ML IJ SOLN
0.0000 mg | Freq: Four times a day (QID) | INTRAMUSCULAR | Status: AC
  Administered 2021-06-21 – 2021-06-22 (×2): 2 mg via INTRAVENOUS
  Filled 2021-06-21 (×2): qty 1

## 2021-06-21 MED ORDER — GABAPENTIN 600 MG PO TABS
600.0000 mg | ORAL_TABLET | Freq: Two times a day (BID) | ORAL | Status: DC
  Administered 2021-06-21 – 2021-06-24 (×7): 600 mg via ORAL
  Filled 2021-06-21 (×10): qty 1

## 2021-06-21 MED ORDER — THIAMINE HCL 100 MG PO TABS
100.0000 mg | ORAL_TABLET | Freq: Every day | ORAL | Status: DC
Start: 2021-06-21 — End: 2021-06-24
  Administered 2021-06-21 – 2021-06-24 (×3): 100 mg via ORAL
  Filled 2021-06-21 (×3): qty 1

## 2021-06-21 MED ORDER — ACETAMINOPHEN 650 MG RE SUPP: 650.0000 mg | Freq: Four times a day (QID) | RECTAL | Status: DC | PRN

## 2021-06-21 MED ORDER — THIAMINE HCL 100 MG/ML IJ SOLN
100.0000 mg | Freq: Every day | INTRAMUSCULAR | Status: DC
  Administered 2021-06-22: 100 mg via INTRAVENOUS
  Filled 2021-06-21: qty 2

## 2021-06-21 MED ORDER — ENOXAPARIN SODIUM 40 MG/0.4ML IJ SOSY
40.0000 mg | PREFILLED_SYRINGE | INTRAMUSCULAR | Status: DC
  Administered 2021-06-21 – 2021-06-23 (×3): 40 mg via SUBCUTANEOUS
  Filled 2021-06-21 (×3): qty 0.4

## 2021-06-21 MED ORDER — ALBUTEROL SULFATE (2.5 MG/3ML) 0.083% IN NEBU: 2.5000 mg | INHALATION_SOLUTION | Freq: Four times a day (QID) | RESPIRATORY_TRACT | Status: DC | PRN

## 2021-06-21 MED ORDER — LORAZEPAM 2 MG/ML IJ SOLN
1.0000 mg | Freq: Once | INTRAMUSCULAR | Status: AC
  Administered 2021-06-21: 1 mg via INTRAVENOUS
  Filled 2021-06-21: qty 1

## 2021-06-21 MED ORDER — SODIUM CHLORIDE 0.9% FLUSH
3.0000 mL | Freq: Once | INTRAVENOUS | Status: AC
  Administered 2021-06-21: 3 mL via INTRAVENOUS

## 2021-06-21 MED ORDER — LORAZEPAM 2 MG/ML IJ SOLN: 0.0000 mg | Freq: Two times a day (BID) | INTRAMUSCULAR | Status: DC

## 2021-06-21 MED ORDER — ACETAMINOPHEN 325 MG PO TABS
650.0000 mg | ORAL_TABLET | Freq: Four times a day (QID) | ORAL | Status: DC | PRN
  Administered 2021-06-21 – 2021-06-23 (×4): 650 mg via ORAL
  Filled 2021-06-21 (×4): qty 2

## 2021-06-21 NOTE — Progress Notes (Signed)
Brief Neuro Update:  Evaluated this patient at the ED bridge as a stroke code. She reports 1 week history of BL lower extremity numbness and difficulty standing along with chronic slurred speech that is worse today along with BL hand numbness and BL hand cramps. She feels she is dehydrated.  Her last drink was Friday at 1600 and she takes Clonazepam 0.5mg  TID per patient.  NIHSS of 1 for slurred speech.  Symptoms are not consistent with an acute stroke. Probably suspect and underlying metabolic etiology and LKW is a week ago. Will cancel stroke code. Ed team to evaluate patient and let me know if they have any questions or concerns for me.   [place] Pager Number [phone]

## 2021-06-21 NOTE — ED Notes (Signed)
Notified MD Roxanne Mins with critical labs

## 2021-06-21 NOTE — ED Notes (Signed)
Pt's pulse ox 94% RA, pt stated "occasionally using oxygen at night". Pt placed on 2L [place] for comfort while sleeping.

## 2021-06-21 NOTE — ED Triage Notes (Signed)
BIB [facility] from home. Woke up 2 hrs ago with numbness in face and tongue, slurred speechj, bilat hands contracted. C/o numbness in bilat feet x 1 week. PIV 18ga LTAC. CBG 127

## 2021-06-21 NOTE — ED Notes (Signed)
Diet was ordered for patient. 

## 2021-06-21 NOTE — ED Notes (Signed)
Admitting notified of CIWA of 12.  2 of ativan given.

## 2021-06-21 NOTE — ED Provider Notes (Signed)
[facility] EMERGENCY DEPARTMENT Provider Note   CSN: [id number] Arrival date & time: 06/21/21  0214     History  Chief Complaint  Patient presents with   Code Stroke    Karen Casey is a 66 y.o. female.  The history is provided by the patient.  She has history of hypertension, COPD, alcohol abuse, benzodiazepine withdrawal seizures, SIADH and is brought in by ambulance as a code stroke.  She has been having numbness in her feet for many months and recently has noted some numbness in her hands.  Tonight, she noted some numbness around her lips.  Numbness is symmetric.  She has not noticed any weakness periods she was having some difficulty with speech tonight.  Symptoms started about 2.5 hours ago.  She denies headache, nausea, vomiting.  She does admit to alcohol abuse but states that her last drink was in the afternoon of 1/6 and states she only had 2 beers that day.   Home Medications Prior to Admission medications   Medication Sig Start Date End Date Taking? Authorizing Provider  hydrOXYzine (VISTARIL) 25 MG capsule TAKE 1 CAPSULE (25 MG TOTAL) BY MOUTH EVERY 6 (SIX) HOURS AS NEEDED. 06/01/21 07/01/21  [name], MD  acetaminophen (TYLENOL) 325 MG tablet Take 975 mg by mouth See admin instructions. Take 975 mg daily, may take a second 975 mg dose as needed for pain    [provider]  albuterol (VENTOLIN HFA) 108 (90 Base) MCG/ACT inhaler TAKE 2 PUFFS BY MOUTH EVERY 6 HOURS AS NEEDED FOR WHEEZE OR SHORTNESS OF BREATH 03/16/21   Cantwell, Celeste C, PA-C  amLODipine (NORVASC) 5 MG tablet Take 1 tablet (5 mg total) by mouth daily. 05/29/21 09/26/21  [name], MD  ANORO ELLIPTA 62.5-25 MCG/ACT AEPB INHALE 1 PUFF BY MOUTH EVERY DAY 06/09/21   Martyn Malay, MD  CALCIUM-MAGNESIUM-ZINC PO Take 1 tablet by mouth daily.    [provider]  clonazePAM (KLONOPIN) 0.5 MG tablet TAKE 1 TABLET (0.5 MG TOTAL) BY MOUTH 3 (THREE) TIMES DAILY AS  NEEDED FOR UP TO 28 DAYS FOR ANXIETY 06/18/21   Pray, Norwood Levo, MD  dicyclomine (BENTYL) 20 MG tablet Take 1 tablet (20 mg total) by mouth 3 (three) times daily as needed for up to 30 doses for spasms. 05/22/21   Wyvonnia Dusky, MD  esomeprazole (NEXIUM) 40 MG capsule TAKE 1 CAPSULE BY MOUTH EVERY DAY AT NOON 03/26/21   [name], MD  feeding supplement (ENSURE ENLIVE / ENSURE PLUS) LIQD Take 237 mLs by mouth 2 (two) times daily between meals. 01/06/21   Alen Bleacher, MD  gabapentin (NEURONTIN) 600 MG tablet TAKE 1 TABLET BY MOUTH TWICE A DAY 06/09/21   Martyn Malay, MD  Multiple Vitamins-Minerals (CENTRUM SILVER 50+WOMEN PO) Take 1 tablet by mouth daily.    [provider]  nicotine (NICODERM CQ - DOSED IN MG/24 HOURS) 21 mg/24hr patch Place 1 patch (21 mg total) onto the skin daily. 01/08/21   Zenia Resides, MD  NON FORMULARY O2 use as needed    [provider]  ondansetron (ZOFRAN-ODT) 8 MG disintegrating tablet Take 1 tablet (8 mg total) by mouth every 8 (eight) hours as needed for nausea or vomiting. 05/22/21   [name], MD  potassium chloride (KLOR-CON) 10 MEQ tablet Take 1 tablet (10 mEq total) by mouth daily for 30 doses. 05/22/21 06/21/21  Wyvonnia Dusky, MD  promethazine (PHENERGAN) 12.5 MG  suppository Place 12.5 mg rectally every 4 (four) hours as needed for nausea/vomiting. Patient not taking: Reported on 05/29/2021 01/26/21   [provider]  QUEtiapine (SEROQUEL) 100 MG tablet Take 1 tablet (100 mg total) by mouth at bedtime. 05/29/21 08/27/21  [name], MD  rifaximin (XIFAXAN) 550 MG TABS tablet Take 1 tablet (550 mg total) by mouth 2 (two) times daily. 04/28/21   Doran Stabler, MD      Allergies    Patient has no known allergies.    Review of Systems   Review of Systems  All other systems reviewed and are negative.  Physical Exam Updated Vital Signs BP (!) 134/102 (BP Location: Right Arm)    Pulse 87    Temp 97.8 F  (36.6 C) (Oral)    Resp 20    Ht 5\' 3"  (1.6 m)    Wt 77.1 kg    SpO2 93%    BMI 30.11 kg/m  Physical Exam Vitals and nursing note reviewed.  66 year old female, resting comfortably and in no acute distress. Vital signs are significant for elevated blood pressure. Oxygen saturation is 93%, which is normal. Head is normocephalic and atraumatic. PERRLA, EOMI. Oropharynx is clear.  Mucous membranes are dry. Neck is nontender and supple without adenopathy or JVD.  There are no carotid bruits. Back is nontender and there is no CVA tenderness. Lungs are clear without rales, wheezes, or rhonchi. Chest is nontender. Heart has regular rate and rhythm without murmur. Abdomen is soft, flat, nontender. Extremities have no cyanosis or edema, full range of motion is present. Skin is warm and dry without rash. Neurologic: Awake and alert, oriented to person and place and mostly oriented to time (thinks it is March, but knows the year).  Speech is fluent but mildly dysarthric, but there is no facial asymmetry.  Cranial nerves are intact.  There is no pronator drift.  Strength is 5/5 in all 4 extremities.  There is decreased sensation distal to the mid calf, no other sensory deficits identified.  He is moderately tremulous.  ED Results / Procedures / Treatments   Labs (all labs ordered are listed, but only abnormal results are displayed) Labs Reviewed  CBG MONITORING, ED - Abnormal; Notable for the following components:      Result Value   Glucose-Capillary 120 (*)    All other components within normal limits  PROTIME-INR  APTT  CBC  DIFFERENTIAL  COMPREHENSIVE METABOLIC PANEL  I-STAT CHEM 8, ED    EKG EKG Interpretation  Date/Time:  Sunday June 21 2021 02:27:48 EST Ventricular Rate:  108 PR Interval:  124 QRS Duration: 83 QT Interval:  368 QTC Calculation: 496 R Axis:   10 Text Interpretation: Sinus tachycardia Ventricular premature complex Low voltage, precordial leads Borderline  prolonged QT interval When compared with ECG of 05/22/2021, No significant change was found Confirmed by Delora Fuel ([id number]) on 06/21/2021 2:34:17 AM  Radiology No results found.  Procedures Procedures  Cardiac monitor shows sinus rhythm per my interpretation.  Medications Ordered in ED Medications  sodium chloride flush (NS) 0.9 % injection 3 mL (has no administration in time range)    ED Course/ Medical Decision Making/ A&P                           Medical Decision Making  Numbness which seems most consistent with peripheral neuropathy, no evidence of stroke.  Patient was seen in conjunction with  Dr. Lorrin Goodell of neurology service.  After consultation, decision was made to cancel code stroke as patient had no truly focal findings.  She is tremulous consistent with alcohol withdrawal.  She also may have some element of benzodiazepine withdrawal.  Old records are reviewed, and she has several hospitalizations for alcohol withdrawal and hyponatremia.  She will be given IV fluids and a screening labs obtained.  ECG shows sinus tachycardia with occasional PAC, unchanged from prior.  Labs shows severe hypomagnesemia, severe hypocalcemia, mild to moderate hypokalemia and mild hyponatremia.  Albumin is low, but she still has severe hypocalcemia even when correcting for degree of hypoalbuminemia.  She is given intravenous calcium, magnesium and oral potassium.  On reexam, she is still somewhat tremulous.  She is placed on CIWA protocol.  Also, oxygen saturation noted to drop to 86% while she is sleeping.  She is placed on nasal oxygen.  Case is discussed with Dr. Rock Nephew of family practice service who agrees to admit the patient.  CRITICAL CARE Performed by: Delora Fuel Total critical care time: 60 minutes Critical care time was exclusive of separately billable procedures and treating other patients. Critical care was necessary to treat or prevent imminent or life-threatening  deterioration. Critical care was time spent personally by me on the following activities: development of treatment plan with patient and/or surrogate as well as nursing, discussions with consultants, evaluation of patient's response to treatment, examination of patient, obtaining history from patient or surrogate, ordering and performing treatments and interventions, ordering and review of laboratory studies, ordering and review of radiographic studies, pulse oximetry and re-evaluation of patient's condition.       Final Clinical Impression(s) / ED Diagnoses Final diagnoses:  Alcohol withdrawal syndrome without complication ([place])  Hypomagnesemia  Hypocalcemia  Hypokalemia  Hyponatremia  Idiopathic peripheral neuropathy    Rx / DC Orders ED Discharge Orders     None         Delora Fuel, MD 34[birth year]/37 [phone]

## 2021-06-21 NOTE — Progress Notes (Signed)
FPTS Brief Progress Note  S:Complains of pain all overs, laying in bed  O: BP 104/72 (BP Location: Right Arm)    Pulse (!) 107    Temp (!) 97.3 F (36.3 C) (Oral)    Resp 20    Ht 5\' 3"  (1.6 m)    Wt 77.1 kg    SpO2 91%    BMI 30.11 kg/m   Gen: Laying in bed comfortably, oriented x 4, nontremulous Resp: CTAB no w/r/c CV: sinus tachycardia to low 100s Feet: no trauma, pulses 2+  A/P: Concern for alcohol withdrawal Last CIWA of 11. -CIWA protocol -monitor electrolytes in AM -consider B12 for numbness in feet - Orders reviewed. Labs for AM ordered, which was adjusted as needed.   Gerrit Heck, MD 06/21/2021, 10:05 PM PGY-1, [facility] Night Resident  Please page [phone] with questions.

## 2021-06-21 NOTE — H&P (Signed)
[facility] Admission History and Physical Service Pager: [phone]  Patient name: Karen Casey Medical record number: [id number] Date of birth: [birth year] Age: 66 y.o. Gender: female  Primary Care Provider: [name], MD Consultants: Neurology Code Status: Full Preferred Emergency Contact:   Name Relation Home Work Mobile   [place] Sister [phone]      Chief Complaint: Numbness  Assessment and Plan: Karen Casey is a 66 y.o. female presenting with numbness in hands, feet, and periorally as well as slurred speech. PMH is significant for HTN, EtOH abuse with history of seizure, paroxysmal atrial fibrillation, GERD, hypoxemic respiratory failure, depression/anxiety, tobacco use, chronic hyponatremia and hypokalemia.  Concern for alcohol withdrawal Patient initially brought in as a code stroke for numbness (hands, feet, and perioral) with speech difficulty; upon arrival code stroke was canceled by neurology as symptoms were not consistent with acute stroke.  Patient's current symptoms include several days of hand and feet numbness/pain followed by acute perioral numbness sensation with associated slurred speech upon waking at midnight.  On physical exam, patient is tremulous but does not have any significant neurologic deficits and is A&O x4.  Patient has sinus tachycardia on telemetry but vitals overall appears stable. Patient has an extensive alcohol abuse history as well as chronic benzodiazepine use for which she has had withdrawal seizures previously and required ICU admission last year.  Patient reports her last alcoholic beverage was yesterday at 4 PM (patient told the ER providers it was 1/6) and that it was 2 12 ounce beverages. - Admit to [facility], attending Dr. Andria Frames - CIWA protocol with Ativan - Continuous cardiac monitoring - Regular diet - Repeating electrolytes as outlined below - Fall precautions - Delirium precautions -  Lovenox for DVT prophylaxis  Hypomagnesemia Severely low magnesium of <0.5.  Patient was started on 2 g IV magnesium in the ER. 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  Admission labs significant for potassium of 3.1. - Repleted with 61mEq in the ER - RFP recheck ordered for 11 AM - We will continue to trend with daily labs  Hypocalcemia Calcium 5.4 on admission, corrects to 6.5 when accounting for albumin.  Possibly contributing to patient's numbness sensations. Patient was started on IV calcium gluconate 1 g in the ER. Consideration that it may be related to hypomagnesemia and may correct, but we will further evaluate for relationship with PTH as well.  - Continue IV calcium gluconate, will give another 1 g. - Monitor with RFP at 11 AM - PTH level with further AM labs  Chronic hyponatremia Sodium on admission 132, patient is chronically hyponatremic and appears to be at baseline. - Continue to monitor with RFP and further labs  Paroxysmal atrial fibrillation Patient in sinus tachycardia on admission per EKG and telemetry.  Possibly secondary to suspected withdrawal syndrome.  Patient is not on any - Continuous cardiac monitoring  HTN Blood pressure elevated on admission to 155/108.  Patient reports she has been taking her home medication of amlodipine 5 mg daily. - Continue home amlodipine 5mg  daily - Continue to monitor closely  Depression   Anxiety   H/o benzodiazepine withdrawal seizures Home medications include clonazepam 0.5 mg 3 times daily PRN, hydroxyzine 25 mg 4 times daily as needed, Seroquel 100 mg nightly. - Continue home  Seroquel - Patient currently on CIWA protocol with Ativan - Continue home clonazepam 0.5mg  TID PRN - Holding home hydroxyzine  Chronic hypoxemic respiratory failure Patient has home O2  and uses about 2 L as needed.  Possible wheezing on exam today, difficult to auscultate due to patient's loud expiratory sounds.  Currently on 2 liters while sleeping. - Obtain chest x-ray - Consider echocardiogram - Monitor respiratory status - Continuous O2 saturation monitoring - Supplemental O2 as needed - Continue home as needed albuterol - Continue home Anoro Ellipta 1 puff daily  GERD   Chronic abdominal pain Patient reports that she does not have reflux, but does take Nexium 40 mg daily.  Patient is currently also self-medicating with marijuana. - Home Nexium not on formulary, will substitute with pantoprazole  Tobacco use disorder Patient smokes at least 10 cigarettes/day and request nicotine patch. - NicoDerm 21 mg  Chronic Pain Patient has reported chronic pain per chart review with home medication of gabapentin 600 mg twice daily. - Tylenol as needed - Continue home gabapentin   FEN/GI: Regular Prophylaxis: Lovenox  Disposition: Med telemetry, observation  History of Present Illness:  Karen Casey is a 66 y.o. female presenting with several days of numbness/tingling sensation in her hands and feet that is progressively worsened.  This morning she woke up at around midnight and the area around her mouth was numb and she was having difficulty with her speech at that time.  Patient became worried she was having a stroke and called EMS.  Patient reports that her last drink was yesterday at 4 PM (per documentation discussion with the ER physician, and they were told last drink was on 1/6, unsure which is correct).   Review Of Systems: Per HPI with the following additions:   Review of Systems  Constitutional:  Positive for activity change. Negative for appetite change, diaphoresis, fatigue and fever.  HENT:  Positive for trouble swallowing. Negative for congestion, rhinorrhea and sore throat.   Eyes:  Negative for visual disturbance.  Respiratory:  Negative for cough,  choking, shortness of breath and wheezing.   Cardiovascular:  Positive for palpitations and leg swelling. Negative for chest pain.  Gastrointestinal:  Positive for abdominal pain (chronic). Negative for blood in stool, constipation, diarrhea, nausea and vomiting.  Genitourinary:  Positive for decreased urine volume. Negative for difficulty urinating, dysuria, hematuria and urgency.  Skin:  Negative for color change and rash.  Neurological:  Positive for tremors, speech difficulty, weakness and numbness. Negative for dizziness and light-headedness.  Psychiatric/Behavioral:  Negative for confusion.     Patient Active Problem List   Diagnosis Date Noted   Headache 05/29/2021   Seborrheic keratoses 05/29/2021   Hepatic steatosis 01/05/2021   Chronic obstructive pulmonary disease, group B, by Global Initiative for Chronic Obstructive Lung Disease 2017 classification ([place]) 12/09/2020   Chronic pain of breast 11/12/2020   Compression fracture of body of thoracic vertebra ([place]) 11/06/2020   Drug withdrawal seizure with complication ([place]) 85/07/7739   SIADH (syndrome of inappropriate ADH production) ([place]) 10/12/2020   Other chest pain    Hypomagnesemia    Long term (current) use of anticoagulants    Protein-calorie malnutrition ([place]) 09/17/2020   Paroxysmal atrial fibrillation ([place])    Alcohol withdrawal ([place]) 09/10/2020   Pulmonary nodules/lesions, multiple 07/14/2020   Chronic left shoulder pain 06/22/2020   HTN (hypertension) 06/02/2018   Body mass index (BMI) 31.0-31.9, adult 03/29/2013   Diarrhea 10/20/2012   Anxiety disorder 06/12/2012   Alcohol use  05/28/2012   Benzodiazepine withdrawal without complication (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   Seizure ([place])    08/2020 per patient   Seizures ([place])    xanax withdrawl- December 2013   Urinary incontinence 06/09/2012    Past Surgical History: Past Surgical History:  Procedure Laterality Date   APPENDECTOMY     BIOPSY  01/04/2021   Procedure: BIOPSY;  Surgeon: Yetta Flock, MD;  Location: [facility];  Service: Gastroenterology;;   BIOPSY  03/12/2021   Procedure: BIOPSY;  Surgeon: Irving Copas., MD;  Location: [facility];  Service: Gastroenterology;;   CATARACT EXTRACTION  06/09/2012   Left eye   COLONOSCOPY WITH PROPOFOL N/A 03/12/2021   Procedure: COLONOSCOPY WITH PROPOFOL;  Surgeon: Irving Copas., MD;  Location: [facility];  Service: Gastroenterology;  Laterality: N/A;   ESOPHAGOGASTRODUODENOSCOPY (EGD) WITH PROPOFOL N/A 01/04/2021   Procedure: ESOPHAGOGASTRODUODENOSCOPY (EGD) WITH PROPOFOL;  Surgeon: Yetta Flock, MD;  Location: [facility];  Service: Gastroenterology;  Laterality: N/A;   left shoulder dislocation  Sept 2011   POLYPECTOMY  03/12/2021   Procedure: POLYPECTOMY;  Surgeon: Mansouraty, Telford Nab., MD;  Location: [facility] ENDOSCOPY;  Service: Gastroenterology;;    Social History: Social History   Tobacco Use   Smoking status: Every Day    Packs/day: 0.50    Years: 43.00    Pack years: 21.50    Types: Cigarettes   Smokeless tobacco: Never   Tobacco comments:    Tobacco info given  Vaping Use   Vaping Use: Never used  Substance Use Topics   Alcohol use: Yes    Alcohol/week: 3.0 standard drinks    Types: 3 Cans of beer per week    Comment: 2-3 times week just beer   Drug use: Not Currently    Types:  Marijuana, Other-see comments    Comment: Past hx of benzo abuse--xanax   Additional social history:   Please also refer to relevant sections of EMR.  Family History: Family History  Problem Relation Age of Onset   Hypertension Mother    Hyperlipidemia Mother    Aneurysm Mother        Rupture - Cause of death   Heart disease Father        MI - cause of death   Depression Father    Parkinsonism Father    Hypertension Sister    Colon cancer Neg Hx    Esophageal cancer Neg Hx    Rectal cancer Neg Hx    Stomach cancer Neg Hx     Allergies and Medications: No Known Allergies No current facility-administered medications on file prior to encounter.   Current Outpatient Medications on File Prior to Encounter  Medication Sig Dispense Refill   albuterol (VENTOLIN HFA) 108 (90 Base) MCG/ACT inhaler TAKE 2 PUFFS BY MOUTH EVERY 6 HOURS AS  NEEDED FOR WHEEZE OR SHORTNESS OF BREATH 8.5 each 2   ANORO ELLIPTA 62.5-25 MCG/ACT AEPB INHALE 1 PUFF BY MOUTH EVERY DAY 60 each 1   dicyclomine (BENTYL) 20 MG tablet Take 1 tablet (20 mg total) by mouth 3 (three) times daily as needed for up to 30 doses for spasms. 30 tablet 0   esomeprazole (NEXIUM) 40 MG capsule TAKE 1 CAPSULE BY MOUTH EVERY DAY AT NOON 90 capsule 1   gabapentin (NEURONTIN) 600 MG tablet TAKE 1 TABLET BY MOUTH TWICE A DAY 180 tablet 0   hydrOXYzine (VISTARIL) 25 MG capsule TAKE 1 CAPSULE (25 MG TOTAL) BY MOUTH EVERY 6 (SIX) HOURS AS NEEDED. 90 capsule 0   Multiple Vitamins-Minerals (CENTRUM SILVER 50+WOMEN PO) Take 1 tablet by mouth daily.     NON FORMULARY O2 use as needed     ondansetron (ZOFRAN-ODT) 8 MG disintegrating tablet Take 1 tablet (8 mg total) by mouth every 8 (eight) hours as needed for nausea or vomiting. 15 tablet 0   promethazine (PHENERGAN) 12.5 MG suppository Place 12.5 mg rectally every 4 (four) hours as needed for nausea/vomiting.     acetaminophen (TYLENOL) 325 MG tablet Take 975 mg by mouth See admin  instructions. Take 975 mg daily, may take a second 975 mg dose as needed for pain     amLODipine (NORVASC) 5 MG tablet Take 1 tablet (5 mg total) by mouth daily. 90 tablet 1   CALCIUM-MAGNESIUM-ZINC PO Take 1 tablet by mouth daily.     clonazePAM (KLONOPIN) 0.5 MG tablet TAKE 1 TABLET (0.5 MG TOTAL) BY MOUTH 3 (THREE) TIMES DAILY AS NEEDED FOR UP TO 28 DAYS FOR ANXIETY 84 tablet 0   feeding supplement (ENSURE ENLIVE / ENSURE PLUS) LIQD Take 237 mLs by mouth 2 (two) times daily between meals. 237 mL 12   nicotine (NICODERM CQ - DOSED IN MG/24 HOURS) 21 mg/24hr patch Place 1 patch (21 mg total) onto the skin daily. (Patient not taking: Reported on 06/21/2021) 28 patch 1   potassium chloride (KLOR-CON) 10 MEQ tablet Take 1 tablet (10 mEq total) by mouth daily for 30 doses. 30 tablet 0   QUEtiapine (SEROQUEL) 100 MG tablet Take 1 tablet (100 mg total) by mouth at bedtime. 90 tablet 2   rifaximin (XIFAXAN) 550 MG TABS tablet Take 1 tablet (550 mg total) by mouth 2 (two) times daily. 20 tablet 0    Objective: BP 116/71    Pulse (!) 105    Temp 97.8 F (36.6 C) (Oral)    Resp (!) 22    Ht 5\' 3"  (1.6 m)    Wt 77.1 kg    SpO2 92%    BMI 30.11 kg/m  Exam: General -- oriented x4, pleasant and cooperative. HEENT -- Head is normocephalic. PERRLA. EOMI. Ears, nose and throat were benign. Neck -- supple; no bruits. Integument -- intact. No rash, erythema, or ecchymoses.  Chest -- good expansion. Difficult to auscultate for wheezing due to patient's loud expiratory sounds Cardiac -- tachycardic, sinus rhythm on telemetry.  Abdomen -- soft, nontender. No masses palpable. Bowel sounds present. Genital, rectal and breast exam -- deferred. CNS -- cranial nerves II through XII grossly intact. Increased neuropathic sensation in lower extremities Extremities - tenderness to palpation with mild non-pitting edema around the ankles. ROM good. 5/5 bilateral strength. Dorsalis pedis pulses present and symmetrical.     Labs and Imaging: CBC BMET  Recent Labs  Lab 06/21/21 0219  WBC 6.0  HGB 12.3  HCT 36.0  PLT 184   Recent Labs  Lab 06/21/21 0219  NA 132*  K 3.1*  CL 86*  CO2 29  BUN <5*  CREATININE 0.94  GLUCOSE 119*  CALCIUM 5.4*     EKG: Sinus tachycardia, single PVC present, borderline Qtc prolongation. Overall unchanged from prior.  DG CHEST PORT 1 VIEW  Result Date: 06/21/2021 CLINICAL DATA:  Shortness of breath. EXAM: PORTABLE CHEST 1 VIEW COMPARISON:  05/18/2021 FINDINGS: 0459 hours. The lungs are clear without focal pneumonia, edema, pneumothorax or pleural effusion. The cardiopericardial silhouette is within normal limits for size. Bones are diffusely demineralized. Telemetry leads overlie the chest. IMPRESSION: No active disease. Electronically Signed   By: Misty Stanley M.D.   On: 06/21/2021 05:09     Rise Patience, DO 06/21/2021, 3:43 AM PGY-2, [place] Intern pager: [phone], text pages welcome

## 2021-06-21 NOTE — ED Notes (Signed)
Admitting notified and responded of abnormal labs.

## 2021-06-21 NOTE — ED Notes (Signed)
Pt appears anxious and jumpy.  Requesting meds for her nerves.  Klonopin given.  To CT

## 2021-06-22 DIAGNOSIS — Z20822 Contact with and (suspected) exposure to covid-19: Secondary | ICD-10-CM | POA: Diagnosis present

## 2021-06-22 DIAGNOSIS — J449 Chronic obstructive pulmonary disease, unspecified: Secondary | ICD-10-CM | POA: Diagnosis present

## 2021-06-22 DIAGNOSIS — G629 Polyneuropathy, unspecified: Secondary | ICD-10-CM | POA: Diagnosis present

## 2021-06-22 DIAGNOSIS — Z79899 Other long term (current) drug therapy: Secondary | ICD-10-CM | POA: Diagnosis not present

## 2021-06-22 DIAGNOSIS — G8929 Other chronic pain: Secondary | ICD-10-CM | POA: Diagnosis present

## 2021-06-22 DIAGNOSIS — M810 Age-related osteoporosis without current pathological fracture: Secondary | ICD-10-CM | POA: Diagnosis present

## 2021-06-22 DIAGNOSIS — R300 Dysuria: Secondary | ICD-10-CM | POA: Diagnosis present

## 2021-06-22 DIAGNOSIS — K219 Gastro-esophageal reflux disease without esophagitis: Secondary | ICD-10-CM | POA: Diagnosis present

## 2021-06-22 DIAGNOSIS — F32A Depression, unspecified: Secondary | ICD-10-CM | POA: Diagnosis present

## 2021-06-22 DIAGNOSIS — E871 Hypo-osmolality and hyponatremia: Secondary | ICD-10-CM | POA: Diagnosis present

## 2021-06-22 DIAGNOSIS — J9611 Chronic respiratory failure with hypoxia: Secondary | ICD-10-CM | POA: Diagnosis present

## 2021-06-22 DIAGNOSIS — E876 Hypokalemia: Secondary | ICD-10-CM | POA: Diagnosis present

## 2021-06-22 DIAGNOSIS — R202 Paresthesia of skin: Secondary | ICD-10-CM | POA: Diagnosis present

## 2021-06-22 DIAGNOSIS — F17210 Nicotine dependence, cigarettes, uncomplicated: Secondary | ICD-10-CM | POA: Diagnosis present

## 2021-06-22 DIAGNOSIS — I480 Paroxysmal atrial fibrillation: Secondary | ICD-10-CM | POA: Diagnosis present

## 2021-06-22 DIAGNOSIS — I10 Essential (primary) hypertension: Secondary | ICD-10-CM | POA: Diagnosis present

## 2021-06-22 LAB — RENAL FUNCTION PANEL
Albumin: 2.6 g/dL — ABNORMAL LOW (ref 3.5–5.0)
Anion gap: 4 — ABNORMAL LOW (ref 5–15)
BUN: <5 mg/dL — ABNORMAL LOW (ref 8–23)
CO2: 32 mmol/L (ref 22–32)
Calcium: 6.2 mg/dL — CL (ref 8.9–10.3)
Chloride: 100 mmol/L (ref 98–111)
Creatinine, Ser: 0.7 mg/dL (ref 0.44–1.00)
GFR, Estimated: >60 mL/min (ref >60)
Glucose, Bld: 124 mg/dL — ABNORMAL HIGH (ref 70–99)
Phosphorus: 2 mg/dL — ABNORMAL LOW (ref 2.5–4.6)
Potassium: 4.2 mmol/L (ref 3.5–5.1)
Sodium: 136 mmol/L (ref 135–145)

## 2021-06-22 LAB — COMPREHENSIVE METABOLIC PANEL
ALT: 15 U/L (ref 0–44)
AST: 22 U/L (ref 15–41)
Albumin: 2.6 g/dL — ABNORMAL LOW (ref 3.5–5.0)
Alkaline Phosphatase: 89 U/L (ref 38–126)
Anion gap: 10 (ref 5–15)
BUN: <5 mg/dL — ABNORMAL LOW (ref 8–23)
CO2: 32 mmol/L (ref 22–32)
Calcium: 6.5 mg/dL — ABNORMAL LOW (ref 8.9–10.3)
Chloride: 92 mmol/L — ABNORMAL LOW (ref 98–111)
Creatinine, Ser: 0.77 mg/dL (ref 0.44–1.00)
GFR, Estimated: >60 mL/min (ref >60)
Glucose, Bld: 93 mg/dL (ref 70–99)
Potassium: 2.9 mmol/L — ABNORMAL LOW (ref 3.5–5.1)
Sodium: 134 mmol/L — ABNORMAL LOW (ref 135–145)
Total Bilirubin: 0.6 mg/dL (ref 0.3–1.2)
Total Protein: 5 g/dL — ABNORMAL LOW (ref 6.5–8.1)

## 2021-06-22 LAB — CBC
HCT: 32.3 % — ABNORMAL LOW (ref 36.0–46.0)
Hemoglobin: 11.6 g/dL — ABNORMAL LOW (ref 12.0–15.0)
MCH: 31.5 pg (ref 26.0–34.0)
MCHC: 35.9 g/dL (ref 30.0–36.0)
MCV: 87.8 fL (ref 80.0–100.0)
Platelets: 122 10*3/uL — ABNORMAL LOW (ref 150–400)
RBC: 3.68 MIL/uL — ABNORMAL LOW (ref 3.87–5.11)
RDW: 14.3 % (ref 11.5–15.5)
WBC: 4 10*3/uL (ref 4.0–10.5)
nRBC: 0 % (ref 0.0–0.2)

## 2021-06-22 LAB — VITAMIN D 25 HYDROXY (VIT D DEFICIENCY, FRACTURES): Vit D, 25-Hydroxy: 13.39 ng/mL — ABNORMAL LOW (ref 30–100)

## 2021-06-22 LAB — MAGNESIUM: Magnesium: 1.3 mg/dL — ABNORMAL LOW (ref 1.7–2.4)

## 2021-06-22 MED ORDER — CAPSAICIN 0.025 % EX CREA
TOPICAL_CREAM | Freq: Two times a day (BID) | CUTANEOUS | Status: DC
  Administered 2021-06-22: 1 via TOPICAL
  Filled 2021-06-22: qty 60

## 2021-06-22 MED ORDER — CALCIUM GLUCONATE-NACL 1-0.675 GM/50ML-% IV SOLN
1.0000 g | Freq: Once | INTRAVENOUS | Status: DC
  Administered 2021-06-22: 1000 mg via INTRAVENOUS
  Filled 2021-06-22 (×2): qty 50

## 2021-06-22 MED ORDER — CALCIUM GLUCONATE 500 MG PO TABS
1.0000 | ORAL_TABLET | Freq: Three times a day (TID) | ORAL | Status: DC
  Filled 2021-06-22 (×3): qty 1

## 2021-06-22 MED ORDER — FLUCONAZOLE 150 MG PO TABS
150.0000 mg | ORAL_TABLET | Freq: Once | ORAL | Status: AC
  Administered 2021-06-22: 150 mg via ORAL
  Filled 2021-06-22: qty 1

## 2021-06-22 MED ORDER — POTASSIUM CHLORIDE CRYS ER 20 MEQ PO TBCR
40.0000 meq | EXTENDED_RELEASE_TABLET | Freq: Once | ORAL | Status: AC
  Administered 2021-06-22: 40 meq via ORAL
  Filled 2021-06-22: qty 2

## 2021-06-22 MED ORDER — POTASSIUM CHLORIDE 10 MEQ/100ML IV SOLN
10.0000 meq | INTRAVENOUS | Status: AC
  Administered 2021-06-22 (×4): 10 meq via INTRAVENOUS
  Filled 2021-06-22 (×4): qty 100

## 2021-06-22 MED ORDER — MAGNESIUM SULFATE 2 GM/50ML IV SOLN
2.0000 g | Freq: Once | INTRAVENOUS | Status: AC
  Administered 2021-06-22: 2 g via INTRAVENOUS
  Filled 2021-06-22: qty 50

## 2021-06-22 MED ORDER — ORAL CARE MOUTH RINSE
15.0000 mL | Freq: Two times a day (BID) | OROMUCOSAL | Status: DC
  Administered 2021-06-23 (×2): 15 mL via OROMUCOSAL

## 2021-06-22 MED ORDER — CALCIUM GLUCONATE 500 MG PO TABS
1.0000 | ORAL_TABLET | Freq: Three times a day (TID) | ORAL | Status: DC
  Filled 2021-06-22: qty 1

## 2021-06-22 MED ORDER — CLONAZEPAM 0.5 MG PO TABS
0.5000 mg | ORAL_TABLET | Freq: Three times a day (TID) | ORAL | Status: DC
  Administered 2021-06-22 – 2021-06-24 (×5): 0.5 mg via ORAL
  Filled 2021-06-22 (×5): qty 1

## 2021-06-22 MED ORDER — CALCIUM GLUCONATE 500 MG PO TABS
1.0000 | ORAL_TABLET | Freq: Three times a day (TID) | ORAL | Status: DC
  Filled 2021-06-22 (×2): qty 1

## 2021-06-22 NOTE — Progress Notes (Signed)
Patient asymptomatic at this time.

## 2021-06-22 NOTE — Plan of Care (Signed)

## 2021-06-22 NOTE — Progress Notes (Signed)
Family Medicine Teaching Service Daily Progress Note Intern Pager: [phone]  Patient name: ERINNE GILLENTINE Medical record number: [id number] Date of birth: 10-10-1955 Age: 66 y.o. Gender: female  Primary Care Provider: [name], MD Consultants: Neurology Code Status: Full  Pt Overview and Major Events to Date:  1/8-admitted  Assessment and Plan:  Olin Hauser L. Gutterman is a 66 year old female who presented with numbness in her hands, feet and perioral in addition to slurred speech.  PMH is significant for paroxysmal A. fib, GERD, HTN, EtOH abuse with history of seizure, hypoxic respiratory failure, tobacco use and chronic hypokalemia and hyponatremia  Hypocalcemia Patient's calcium this morning was 6.9.  Patient continues to endorse some numbness of the hands and feet but improved.  Patient patient's vitamin D level is low at 13.39, albumin was 2.6 and PTH is on the higher end of normal at 65. Most likely due to insensitivity to Vitamin D. Will start patient on Vitamin supplement  -Start Vitamin D 50,000 unit daily -Continue PO calcium gluconate 500 mg 3 times daily -Monitor daily BMP  Acute on chronic hypokalemia Potassium this morning was 4.4. -Continue daily BMP lab  Hypomagnesemia Patient's magnesium this morning was 1.4 and repleted with 2g mg. -Continue to replete magnesium as needed -Continue daily BMP lab  Chronic hyponatremia Patient's sodium this morning was 136. -Continue monitoring daily BMP  Concern for alcohol withdrawal Patient shows no sign of alcohol withdrawal.  There is no tremor or agitation.  We will discontinue CIWA at this time. -Discontinue CIWA protocol  Dysuria Patient endorses burning sensation vaginal tenderness. She is s/p fluconazole.  UA was negative.  Concerns for possible neoplasia Patient with recent history of significant weight loss, hypocalcemia and early satiety.  We will follow-up outpatient for mammogram  Chronic Hypoxemic  Respiratory failure Stable; On room air with O2 saturation at 97% -Continue albuterol -Continue home Anoro Ellipta 1 puff daily  Other chronic but stable  GERD- Continue Pantoprazole Tobacco use disorder- continue nicotine 12mg  Chronic pain - Tylenol PRN and Gabapentin  Depression - on  klonopin     FEN/GI: Regular PPx: Levonox Dispo:Home pending clinical improvement . Barriers include clinical status.   Subjective:  Ms. Kalp is awake and more agitated this morning.  She reports being confused overnight and not having good sleep.  Objective: Temp:  [97.6 F (36.4 C)-98.2 F (36.8 C)] 98.2 F (36.8 C) (01/09 1551) Pulse Rate:  [104-109] 109 (01/09 1551) Resp:  [17-20] 18 (01/09 1551) BP: (104-131)/(65-90) 114/74 (01/09 1551) SpO2:  [91 %-96 %] 93 % (01/09 1551) Physical Exam: General:Awake, well appearing, NAD HEENT: Atraumatic, MMM, No sclera icterus CV: RRR, no murmurs, normal S1/S2 Pulm: CTAB, good WOB on RA, no crackles or wheezing Abd: Soft, no distension, no tenderness Skin: dry, warm Ext: No BLE edema, +2 Pedal and radial pulse.   Laboratory: Recent Labs  Lab 06/21/21 0219 06/22/21 0110  WBC 6.0 4.0  HGB 12.3 11.6*  HCT 36.0 32.3*  PLT 184 122*   Recent Labs  Lab 06/21/21 0219 06/21/21 1215 06/22/21 0110 06/22/21 1153  NA 132* 130* 134* 136  K 3.1* 3.5 2.9* 4.2  CL 86* 87* 92* 100  CO2 29 27 32 32  BUN <5* <5* <5* <5*  CREATININE 0.94 1.10* 0.77 0.70  CALCIUM 5.4* 6.5* 6.5* 6.2*  PROT 5.0*  --  5.0*  --   BILITOT 1.1  --  0.6  --   ALKPHOS 72  --  89  --  ALT 14  --  15  --   AST 39  --  22  --   GLUCOSE 119* 108* 93 124*      Imaging/Diagnostic Tests: No new images  Alen Bleacher, MD 06/22/2021, 8:00 PM PGY-1, [place] Intern pager: [phone], text pages welcome

## 2021-06-22 NOTE — Hospital Course (Addendum)
Karen Casey is a 66 y.o. female who was admitted to the [facility] Medicine Teaching Service at [facility] for perioral numbness with slurred speech and numbness of on the hands and feet. Hospital course is outlined below by system.   Numbness: Perioral, hands and feet She was admitted for code stroke due to concerns for numbness and slurred speech, however code stroke was canceled as patient's symptoms and are consistent with an acute stroke.  Patient's initial labs showed significant electrolyte abnormality which included calcium.  Calcium on admission was 5.4.  Her PTH was on the higher end of normal at 65 and vitamin D was low at 13.39.  Given lab findings patient's hypocalcemia was most likely attributed to insensitivity to low vitamin D. She was started on 4 weeks of high-dose vitamin D 50,000 mg weekly and recommend follow-up with PCP for reduced dose afterwards.  Electrolytes abnormalities On admission patient was found to have multiple electrolyte abnormalities.  She had hypokalemia, hypomagnesemia, hypocalcemia, and hyponatremia.  Initial lab findings showed low Mg <0.5, K 3.1, Ca 5.4, and sodium Na 132. Her initial presentation of numbness was attributed to hypocalcemia. During her stay patient electrolyte were monitored daily BMP and electrolyte were repleted as needed.  She was also placed on 24-hour cardiac monitoring through out her stay as we closely monitory heart rhythm. By time of discharge patient's electrolytes were corrected and within normal limits.  Concern for alcohol withdrawal Patient has history of alcohol use.  However she reports she has significantly reduced her alcohol consumption.  On admission was found to be tremulous and EKG showed sinus tach.  She was monitored closely using the CIWA protocol and received Ativan as needed.  By the time of discharge patient CIWA protocol was discontinued and there were no concerns for alcohol withdrawal symptoms.  Dysuria During  hospitalization patient endorsed vaginal burning sensation and tenderness.  Denied any vaginal discharge or order.  UA was obtained which was negative.  Patient was given one-time dose of fluconazole by time of discharge patient says symptoms have significantly improved.  Concerning for possible neoplasia Patient with recent history of weight loss and hypokalemia as well as satiety was suspicious for possible neoplasia.  Recommend patient get a repeat mammogram outpatient.  PCP follow-up for recommendation. Needs repeat mammogram  Started on Vitamin D 50,000u for 4 weeks

## 2021-06-22 NOTE — Progress Notes (Addendum)
Family Medicine Teaching Service Daily Progress Note Intern Pager: [phone]  Patient name: Karen Casey Medical record number: [id number] Date of birth: 1955-07-27 Age: 66 y.o. Gender: female  Primary Care Provider: [name], MD Consultants: Neurology Code Status: Full  Pt Overview and Major Events to Date:  1/8-admitted  Assessment and Plan:  Karen Casey is a 66 year old female who presented with numbness in her hands, feet and peioral in addition to slurred speech.  PMH is significant for HTN, paroxysmal A. fib, GERD, EtOH abuse with history of seizure, hypoxemic respiratory failure, depression/anxiety, tobacco use, chronic hyponatremia and hypokalemia  Hypomagnesemia Patient's magnesium this morning was 1.3 and received 2 g of magnesium. -Repleted this morning a.m. with 2 g of Mg -Continue daily BMP lab  Acute on chronic hypokalemia Seen this morning was 2.9 which was repleted with 40 meq oral K and 30 meq IV. -Continue daily BMP lab  Hypocalcemia Patient's calcium this morning was 6.5 and received IV calcium gluconate 1g .  Patient continues to endorse some numbness of the extremities.  Possibly associated with hypomagnesemia. -Start PO calcium gluconate 500 mg tid tomorrow. -Obtain vit D level -Obtain RFT at PM  Chronic hyponatremia Patient sodium this morning was 134. Continue monitoring daily BMP  Concern for alcohol withdrawal CIWA overnight was 11 last two CIWA this morning was 7>6.  On exam patient doesn't appear tremulous and denies any hallucinations and no reported agitation.  She still endorses some numbness in the extremities which were most likely related to her electrolyte imbalance with hypocalcemia -Continue CIWA protocol -Continue cardiac monitoring -Fall precaution -Delirium precautions -Continue monitoring daily electrolytes  Dysuria  Patient endorse dysuria and vaginal tenderness which she said started a few days ago. Denies  noticing any odor. Will obtain UA and consider starting single dose of fluconazole. - Obtain UA  -Consider single dose of Fluconazole   Concern for possible Neoplasia Given history of recent significant weight loss, hypocalcemia and report of early satiety there is consideration for possible malignancy. CT spine and CXR were negative. Will consider mammogram outpatient.  Chronic hypoxemic respiratory failure Respiratory status is stable on 2 L with O2 saturation of 94. -Loosely monitor respiratory status -Continue O2 saturation monitoring -Continue home medication of albuterol -Continue home Anoro Ellipta 1 puff daily  Other chronic and stable conditions GERD-continue pantoprazole Tobacco use disorder-continue nicotine 12 mg Chronic pain-continue Tylenol as needed and home gabapentin Depression   anxiety-continue home clonazepam 0.5 mg 3 times daily  FEN/GI: Regular PPx: Lovenox Dispo:Home pending clinical improvement . Barriers include clinical status.   Subjective:  Karen Casey was unhappy this morning due to continued numbness of her extremities and unresolved diarrhea.  Objective: Temp:  [97.3 F (36.3 C)-98.4 F (36.9 C)] 98 F (36.7 C) (01/09 0336) Pulse Rate:  [100-108] 104 (01/09 0336) Resp:  [17-26] 19 (01/09 0336) BP: (94-141)/(55-91) 131/83 (01/09 0336) SpO2:  [91 %-100 %] 96 % (01/09 0336) Physical Exam: General:Awake, well appearing, NAD HEENT: Atraumatic, MMM, No sclera icterus CV: RRR, no murmurs, normal S1/S2 Pulm: CTAB, good WOB on RA, no crackles or wheezing Abd: Soft, no distension, no tenderness Skin: dry, warm Ext: No BLE edema, +2 Pedal and radial pulse.   Laboratory: Recent Labs  Lab 06/21/21 0219 06/22/21 0110  WBC 6.0 4.0  HGB 12.3 11.6*  HCT 36.0 32.3*  PLT 184 122*   Recent Labs  Lab 06/21/21 0219 06/21/21 1215 06/22/21 0110  NA 132* 130* 134*  K 3.1* 3.5  2.9*  CL 86* 87* 92*  CO2 29 27 32  BUN <5* <5* <5*  CREATININE 0.94  1.10* 0.77  CALCIUM 5.4* 6.5* 6.5*  PROT 5.0*  --  5.0*  BILITOT 1.1  --  0.6  ALKPHOS 72  --  89  ALT 14  --  15  AST 39  --  22  GLUCOSE 119* 108* 93     Imaging/Diagnostic Tests: No new imaging  Alen Bleacher, MD 06/22/2021, 7:32 AM PGY-1, [place] Intern pager: [phone], text pages welcome

## 2021-06-22 NOTE — Progress Notes (Signed)
Lab Calcium level 6.2

## 2021-06-22 NOTE — Progress Notes (Signed)
°  Transition of Care [facility]) Screening Note   Patient Details  Name: Karen Casey Date of Birth: August 12, 1955   Transition of Care [facility]) CM/SW Contact:    Cyndi Bender, RN Phone Number: 06/22/2021, 8:32 AM    Transition of Care Department [facility]) has reviewed patient.We will continue to monitor patient advancement through interdisciplinary progression rounds. If new patient transition needs arise, please place a TOC consult.

## 2021-06-22 NOTE — Progress Notes (Signed)
FPTS Brief Progress Note  S: Patient was evaluated after she said that she had woken up and felt that there were people walking in blue jeans and felt like she was in the hospital, concern for auditory/visual hallucinations.  States that she may have just "woken up from a dream"   O: BP 114/74 (BP Location: Left Arm)    Pulse (!) 103    Temp 98.2 F (36.8 C) (Oral)    Resp 18    Ht 5\' 3"  (1.6 m)    Wt 77.1 kg    SpO2 93%    BMI 30.11 kg/m   General: mildly anxious, A&Ox4, not tremulous Head: Normocephalic atraumatic CV: Tachycardic to low 100s  no murmurs rubs or gallops Respiratory:  no increased work of breathing Neuro: No focal deficits  A/P: Concern for Alcohol Withdrawal / Possible Delirium CIWA 13>16. Not overtly tremulous, could likely be delirium from being in the hospital. -CIWA protocol -Delirium precautions - Orders reviewed. Labs for AM ordered, which was adjusted as needed.   Gerrit Heck, MD 06/22/2021, 8:54 PM PGY-1, [place] [facility] Night Resident  Please page [phone] with questions.

## 2021-06-23 ENCOUNTER — Telehealth: Payer: Self-pay

## 2021-06-23 ENCOUNTER — Ambulatory Visit: Payer: 59 | Admitting: Family Medicine

## 2021-06-23 LAB — CBC
HCT: 33.4 % — ABNORMAL LOW (ref 36.0–46.0)
Hemoglobin: 11.5 g/dL — ABNORMAL LOW (ref 12.0–15.0)
MCH: 31.3 pg (ref 26.0–34.0)
MCHC: 34.4 g/dL (ref 30.0–36.0)
MCV: 91 fL (ref 80.0–100.0)
Platelets: 126 10*3/uL — ABNORMAL LOW (ref 150–400)
RBC: 3.67 MIL/uL — ABNORMAL LOW (ref 3.87–5.11)
RDW: 14.8 % (ref 11.5–15.5)
WBC: 5.8 10*3/uL (ref 4.0–10.5)
nRBC: 0 % (ref 0.0–0.2)

## 2021-06-23 LAB — RENAL FUNCTION PANEL
Albumin: 2.6 g/dL — ABNORMAL LOW (ref 3.5–5.0)
Anion gap: 8 (ref 5–15)
BUN: <5 mg/dL — ABNORMAL LOW (ref 8–23)
CO2: 28 mmol/L (ref 22–32)
Calcium: 6.9 mg/dL — ABNORMAL LOW (ref 8.9–10.3)
Chloride: 100 mmol/L (ref 98–111)
Creatinine, Ser: 0.63 mg/dL (ref 0.44–1.00)
GFR, Estimated: >60 mL/min (ref >60)
Glucose, Bld: 88 mg/dL (ref 70–99)
Phosphorus: 2.5 mg/dL (ref 2.5–4.6)
Potassium: 4.4 mmol/L (ref 3.5–5.1)
Sodium: 136 mmol/L (ref 135–145)

## 2021-06-23 LAB — MAGNESIUM: Magnesium: 1.4 mg/dL — ABNORMAL LOW (ref 1.7–2.4)

## 2021-06-23 LAB — PARATHYROID HORMONE, INTACT (NO CA): PTH: 65 pg/mL (ref 15–65)

## 2021-06-23 MED ORDER — MAGNESIUM SULFATE 2 GM/50ML IV SOLN
2.0000 g | Freq: Once | INTRAVENOUS | Status: AC
  Administered 2021-06-23: 2 g via INTRAVENOUS
  Filled 2021-06-23: qty 50

## 2021-06-23 MED ORDER — VITAMIN D (ERGOCALCIFEROL) 1.25 MG (50000 UNIT) PO CAPS
50000.0000 [IU] | ORAL_CAPSULE | ORAL | Status: DC
  Administered 2021-06-23: 50000 [IU] via ORAL
  Filled 2021-06-23: qty 1

## 2021-06-23 MED ORDER — MELATONIN 3 MG PO TABS: 3.0000 mg | ORAL_TABLET | Freq: Every evening | ORAL | Status: DC | PRN

## 2021-06-23 MED ORDER — CALCIUM CITRATE 950 (200 CA) MG PO TABS
200.0000 mg | ORAL_TABLET | Freq: Three times a day (TID) | ORAL | Status: DC
  Administered 2021-06-23 – 2021-06-24 (×4): 200 mg via ORAL
  Filled 2021-06-23 (×4): qty 1

## 2021-06-23 MED ORDER — HYDROXYZINE HCL 25 MG PO TABS
25.0000 mg | ORAL_TABLET | Freq: Four times a day (QID) | ORAL | Status: DC | PRN
  Administered 2021-06-23 – 2021-06-24 (×2): 25 mg via ORAL
  Filled 2021-06-23 (×2): qty 1

## 2021-06-23 NOTE — Progress Notes (Signed)
FPTS Brief Progress Note  S: Patient resting comfortably in bed. Complains of "knot on right leg" and wanting "sleep medicine"   O: BP 134/79 (BP Location: Left Arm)    Pulse 100    Temp 97.7 F (36.5 C) (Oral)    Resp 19    Ht 5\' 3"  (1.6 m)    Wt 77.1 kg    SpO2 95%    BMI 30.11 kg/m   General: NAD, awake, alert, responsive to questions CV: Pulses 2+ Respiratory: no increased work of breathing Abdomen: Soft, non-tender, non-distended, normoactive bowel sounds  Extremities: Moves upper and lower extremities freely, no edema in LE, sensitive to light touch over anterior leg but no erythema warmth or swelling  A/P: Hypocalcemia -Discussed Vit D deficiency with her. -will order melatonin prn for tonight -avoiding sedating medications such as benzodiazepines - Orders reviewed. Labs for AM ordered, which was adjusted as needed.    Gerrit Heck, MD 06/23/2021, 8:20 PM PGY-1, [facility] Family [facility] Night Resident  Please page [phone] with questions.

## 2021-06-23 NOTE — Progress Notes (Incomplete)
Family Medicine Teaching Service Daily Progress Note Intern Pager: [phone]  Patient name: Karen Casey Medical record number: [id number] Date of birth: 04/23/1956 Age: 66 y.o. Gender: female  Primary Care Provider: [name], MD Consultants: Neurology Code Status: Full  Pt Overview and Major Events to Date:  1/8-admitted  Assessment and Plan:  Karen Casey is a 66 year old female who was admitted for with numbness in hands and feet and perioral in addition to slurred speech.  PMH is significant for paroxysmal A. fib HTN, EtOH abuse with history of seizure, GERD, tobacco use, hypoxic respiratory failure, and chronic hypokalemia and hyponatremia.  Electrolyte imbalance Patient's calcium this morning was***.  Patient reports that her numbness of her extremities has improved. Patient's potassium this morning was** Patient's magnesium this morning was*** -Continue vitamin D 50,000 units weekly plan-continue p.o. calcium gluconate 500 mg 3 times daily -Continue daily BMP -Continue to replenish electrolytes as needed.  Dysuria Patient endorses burning sensation and vaginal tenderness.  Which she has received single dose of fluconazole.  Concern for possible neoplasia Patient with recent history of weight loss, hypokalemia as well as early satiety. Recommend follow up out patient for mammogram .  Order chronic but stable conditions Hypoxemic respiratory failure- on albuterol and Ellipta GERD-continue pantoprazole To bilateral stents or- Nicotine patch Chronic pain-Tylenol as needed and gabapentin Depression-  Concern for alcohol withdrawal Patient shows no signs of alcohol withdrawal.  No tremors and agitation CIWA has been discontinued.   FEN/GI: Regular PPx: Reflux Dispo:Home pending clinical improvement . Barriers include clinical status.   Subjective:  ***  Objective: Temp:  [98.1 F (36.7 C)-98.8 F (37.1 C)] 98.8 F (37.1 C) (01/10 1222) Pulse  Rate:  [100-109] 100 (01/10 1222) Resp:  [18-20] 20 (01/10 1222) BP: (114-135)/(74-88) 122/83 (01/10 1222) SpO2:  [93 %-98 %] 94 % (01/10 1222) Physical Exam: General: *** Cardiovascular: *** Respiratory: *** Abdomen: *** Extremities: ***  Laboratory: Recent Labs  Lab 06/21/21 0219 06/22/21 0110 06/23/21 0031  WBC 6.0 4.0 5.8  HGB 12.3 11.6* 11.5*  HCT 36.0 32.3* 33.4*  PLT 184 122* 126*   Recent Labs  Lab 06/21/21 0219 06/21/21 1215 06/22/21 0110 06/22/21 1153 06/23/21 0031  NA 132*   < > 134* 136 136  K 3.1*   < > 2.9* 4.2 4.4  CL 86*   < > 92* 100 100  CO2 29   < > 32 32 28  BUN <5*   < > <5* <5* <5*  CREATININE 0.94   < > 0.77 0.70 0.63  CALCIUM 5.4*   < > 6.5* 6.2* 6.9*  PROT 5.0*  --  5.0*  --   --   BILITOT 1.1  --  0.6  --   --   ALKPHOS 72  --  89  --   --   ALT 14  --  15  --   --   AST 39  --  22  --   --   GLUCOSE 119*   < > 93 124* 88   < > = values in this interval not displayed.    ***  Imaging/Diagnostic Tests: Alen Bleacher, MD 06/23/2021, 3:26 PM PGY-***, [place] Intern pager: [phone], text pages welcome

## 2021-06-23 NOTE — Telephone Encounter (Signed)
Patient LVM on nurse line requesting to speak with Dr. Thompson Grayer. Patient reports she is currently admitted and wants to know when she is going home. Patient states, "I have got to go." Patient also reports Dr. Thompson Grayer did not come and see her today. Patient reports no one has told her why she is being kept at the hospital. Patient states, "They said I have low calcium, well ill drink a cow to go home."   Will forward to PCP.

## 2021-06-23 NOTE — Progress Notes (Signed)
Slept mostly through the night. Assessments done and documented. Required 2mg  Ativan last night per CIWA score. Hallucinations noted.Resident MD aware. Remains continent/incontinent this shift. Purewick removed and freshened up this am. Telemetry monitoring continues. Saturations low Nineties without oxygen (Refused). Safety maintained always.

## 2021-06-24 ENCOUNTER — Other Ambulatory Visit: Payer: Self-pay | Admitting: Family Medicine

## 2021-06-24 DIAGNOSIS — F419 Anxiety disorder, unspecified: Secondary | ICD-10-CM

## 2021-06-24 LAB — BASIC METABOLIC PANEL
Anion gap: 10 (ref 5–15)
BUN: <5 mg/dL — ABNORMAL LOW (ref 8–23)
CO2: 25 mmol/L (ref 22–32)
Calcium: 8.2 mg/dL — ABNORMAL LOW (ref 8.9–10.3)
Chloride: 98 mmol/L (ref 98–111)
Creatinine, Ser: 0.61 mg/dL (ref 0.44–1.00)
GFR, Estimated: >60 mL/min (ref >60)
Glucose, Bld: 89 mg/dL (ref 70–99)
Potassium: 4 mmol/L (ref 3.5–5.1)
Sodium: 133 mmol/L — ABNORMAL LOW (ref 135–145)

## 2021-06-24 LAB — MAGNESIUM: Magnesium: 1.5 mg/dL — ABNORMAL LOW (ref 1.7–2.4)

## 2021-06-24 MED ORDER — MAGNESIUM OXIDE -MG SUPPLEMENT 400 (240 MG) MG PO TABS
400.0000 mg | ORAL_TABLET | Freq: Once | ORAL | Status: AC
  Administered 2021-06-24: 400 mg via ORAL
  Filled 2021-06-24: qty 1

## 2021-06-24 MED ORDER — MAGNESIUM SULFATE 4 GM/100ML IV SOLN
4.0000 g | Freq: Once | INTRAVENOUS | Status: DC
  Filled 2021-06-24: qty 100

## 2021-06-24 MED ORDER — VITAMIN D (ERGOCALCIFEROL) 1.25 MG (50000 UNIT) PO CAPS: 50000.0000 [IU] | ORAL_CAPSULE | ORAL | 0 refills | Status: AC

## 2021-06-24 MED ORDER — GABAPENTIN 600 MG PO TABS: 600.0000 mg | ORAL_TABLET | Freq: Three times a day (TID) | ORAL | 0 refills | Status: DC

## 2021-06-24 NOTE — Discharge Summary (Signed)
[facility] Discharge Summary  Patient name: Karen Casey Medical record number: [id number] Date of birth: 13-Jun-1956 Age: 66 y.o. Gender: female Date of Admission: 06/21/2021  Date of Discharge: 06/24/2021 Admitting Physician: Zenia Resides, MD  Primary Care Provider: [name], MD Consultants: Neurology  Indication for Hospitalization: Numbness: Perioral, hands and feet  Discharge Diagnoses/Problem List:  Numbness: Perioral, hands and feet Electrolyte abnormalities  Disposition: Home  Discharge Condition: Stable  Discharge Exam:  General:Awake, well appearing, NAD HEENT: Atraumatic, MMM, No sclera icterus CV: RRR, no murmurs, normal S1/S2 Pulm: CTAB, good WOB on RA, no crackles or wheezing Abd: Soft, no distension, no tenderness Skin: dry, warm Ext: No BLE edema, +2 Pedal and radial pulse.   Brief Hospital Course:  Karen Casey is a 66 y.o. female who was admitted to the [facility] Medicine Teaching Service at [facility] for perioral numbness with slurred speech and numbness of on the hands and feet. Hospital course is outlined below by system.   Numbness: Perioral, hands and feet She was admitted for code stroke due to concerns for numbness and slurred speech, however code stroke was canceled as patient's symptoms and are consistent with an acute stroke.  Patient's initial labs showed significant electrolyte abnormality which included calcium.  Calcium on admission was 5.4.  Her PTH was on the higher end of normal at 65 and vitamin D was low at 13.39.  Given lab findings patient's hypocalcemia was most likely attributed to insensitivity to low vitamin D. She was started on 4 weeks of high-dose vitamin D 50,000 mg weekly and recommend follow-up with PCP for reduced dose afterwards.  Electrolytes abnormalities On admission patient was found to have multiple electrolyte abnormalities.  She had hypokalemia, hypomagnesemia, hypocalcemia, and  hyponatremia.  Initial lab findings showed low Mg <0.5, K 3.1, Ca 5.4, and sodium Na 132. Her initial presentation of numbness was attributed to hypocalcemia. During her stay patient electrolyte were monitored daily BMP and electrolyte were repleted as needed.  She was also placed on 24-hour cardiac monitoring through out her stay as we closely monitory heart rhythm. By time of discharge patient's electrolytes were corrected and within normal limits.  Concern for alcohol withdrawal Patient has history of alcohol use.  However she reports she has significantly reduced her alcohol consumption.  On admission was found to be tremulous and EKG showed sinus tach.  She was monitored closely using the CIWA protocol and received Ativan as needed.  By the time of discharge patient CIWA protocol was discontinued and there were no concerns for alcohol withdrawal symptoms.  Dysuria During hospitalization patient endorsed vaginal burning sensation and tenderness.  Denied any vaginal discharge or order.  UA was obtained which was negative.  Patient was given one-time dose of fluconazole by time of discharge patient says symptoms have significantly improved.  Concerning for possible neoplasia Patient with recent history of weight loss and hypokalemia as well as satiety was suspicious for possible neoplasia.  Recommend patient get a repeat mammogram outpatient.  PCP follow-up for recommendation. Needs repeat mammogram  Patient was started on Vitamin D 50,000   Significant Procedures: none   Significant Labs and Imaging:  Recent Labs  Lab 06/21/21 0219 06/22/21 0110 06/23/21 0031  WBC 6.0 4.0 5.8  HGB 12.3 11.6* 11.5*  HCT 36.0 32.3* 33.4*  PLT 184 122* 126*   Recent Labs  Lab 06/21/21 0219 06/21/21 1215 06/22/21 0110 06/22/21 1153 06/23/21 0031 06/24/21 0547  NA 132* 130*  134* 136 136 133*  K 3.1* 3.5 2.9* 4.2 4.4 4.0  CL 86* 87* 92* 100 100 98  CO2 29 27 32 32 28 25  GLUCOSE 119* 108* 93 124*  88 89  BUN <5* <5* <5* <5* <5* <5*  CREATININE 0.94 1.10* 0.77 0.70 0.63 0.61  CALCIUM 5.4* 6.5* 6.5* 6.2* 6.9* 8.2*  MG <0.5* 2.7* 1.3*  --  1.4* 1.5*  PHOS  --  3.1  --  2.0* 2.5  --   ALKPHOS 72  --  89  --   --   --   AST 39  --  22  --   --   --   ALT 14  --  15  --   --   --   ALBUMIN 2.6* 2.7* 2.6* 2.6* 2.6*  --    CT CHEST WO CONTRAST  Result Date: 06/21/2021 CLINICAL DATA:  Unintended weight loss, thoracic compression fracture EXAM: CT CHEST WITHOUT CONTRAST CT THORACIC SPINE WITHOUT CONTRAST TECHNIQUE: Multidetector CT imaging of the chest was performed following the standard protocol without IV contrast. COMPARISON:  CT chest, 02/03/2021 FINDINGS: CT CHEST FINDINGS Cardiovascular: Aortic atherosclerosis. Mild cardiomegaly. No pericardial effusion. Mediastinum/Nodes: No enlarged mediastinal, hilar, or axillary lymph nodes. Thyroid gland, trachea, and esophagus demonstrate no significant findings. Lungs/Pleura: Mild, bandlike scarring of the left greater than right lung bases. Occasional tiny pulmonary nodules are stable and definitively benign, for example a 0.4 cm fissural nodule of the superior segment left lower lobe (series 4, image 65) and a 0.3 cm nodule of the anterior left upper lobe (series 4, image 44). No pleural effusion or pneumothorax. Upper Abdomen: No acute abnormality. Musculoskeletal: No chest wall mass or suspicious bone lesions identified. Multiple chronic callused fracture deformities of the lateral and anterior right ribs as well as of the manubrium. CT THORACIC SPINE FINDINGS Alignment: Mildly exaggerated thoracic kyphosis. Vertebral bodies: Osteopenia. No significant change in subtle superior endplate deformity of T2, inferior endplate wedge deformity of T5 and high-grade vertebral plana deformity of T6. No new vertebral fractures. Disc spaces: Mild to moderate multilevel disc space height loss and osteophytosis throughout the thoracic spine, worst at the lower levels.  IMPRESSION: 1. No noncontrast CT findings of the chest to explain unintended weight loss. No evidence of mass or lymphadenopathy. 2. No significant change in subtle superior endplate deformity of T2, inferior endplate wedge deformity of T5 and high-grade vertebral plana deformity of T6. No new vertebral fractures. 3. Stable, definitively benign small pulmonary nodules for which no further follow-up or characterization is required. 4. Mild cardiomegaly. Aortic Atherosclerosis (ICD10-I70.0). Electronically Signed   By: Delanna Ahmadi M.D.   On: 06/21/2021 13:00   CT ABDOMEN PELVIS W CONTRAST  Result Date: 05/22/2021 CLINICAL DATA:  Abdominal pain for 3 days. EXAM: CT ABDOMEN AND PELVIS WITH CONTRAST TECHNIQUE: Multidetector CT imaging of the abdomen and pelvis was performed using the standard protocol following bolus administration of intravenous contrast. CONTRAST:  158mL OMNIPAQUE IOHEXOL 300 MG/ML  SOLN COMPARISON:  Prior CT of the abdomen and pelvis on 01/02/2021 FINDINGS: Lower chest: No acute abnormality. Hepatobiliary: No focal liver abnormality is seen. No gallstones, gallbladder wall thickening, or biliary dilatation. Pancreas: Unremarkable. No pancreatic ductal dilatation or surrounding inflammatory changes. Spleen: Normal in size without focal abnormality. Adrenals/Urinary Tract: Adrenal glands are unremarkable. Kidneys are normal, without renal calculi, focal lesion, or hydronephrosis. Bladder is unremarkable. Stomach/Bowel: Bowel shows no evidence of obstruction or ileus. There is some fluid scattered throughout much  of the colon and multiple small bowel loops which may be consistent with enteritis. No bowel wall thickening, focal lesion or evidence of free intraperitoneal air. Vascular/Lymphatic: Mild atherosclerosis of the abdominal aorta without evidence of aneurysm. No enlarged abdominal or pelvic lymph nodes identified. Reproductive: Uterus and bilateral adnexa are unremarkable. Other: No  abdominal wall hernia or abnormality. No abdominopelvic ascites. Musculoskeletal: Degenerative disc disease at L5-S1. No bony lesions. IMPRESSION: There is some fluid scattered in nondistended small bowel and colon which may be consistent with enteritis. Electronically Signed   By: Aletta Edouard M.D.   On: 05/22/2021 09:57   CT T-SPINE NO CHARGE  Result Date: 06/21/2021 CLINICAL DATA:  Unintended weight loss, thoracic compression fracture EXAM: CT CHEST WITHOUT CONTRAST CT THORACIC SPINE WITHOUT CONTRAST TECHNIQUE: Multidetector CT imaging of the chest was performed following the standard protocol without IV contrast. COMPARISON:  CT chest, 02/03/2021 FINDINGS: CT CHEST FINDINGS Cardiovascular: Aortic atherosclerosis. Mild cardiomegaly. No pericardial effusion. Mediastinum/Nodes: No enlarged mediastinal, hilar, or axillary lymph nodes. Thyroid gland, trachea, and esophagus demonstrate no significant findings. Lungs/Pleura: Mild, bandlike scarring of the left greater than right lung bases. Occasional tiny pulmonary nodules are stable and definitively benign, for example a 0.4 cm fissural nodule of the superior segment left lower lobe (series 4, image 65) and a 0.3 cm nodule of the anterior left upper lobe (series 4, image 44). No pleural effusion or pneumothorax. Upper Abdomen: No acute abnormality. Musculoskeletal: No chest wall mass or suspicious bone lesions identified. Multiple chronic callused fracture deformities of the lateral and anterior right ribs as well as of the manubrium. CT THORACIC SPINE FINDINGS Alignment: Mildly exaggerated thoracic kyphosis. Vertebral bodies: Osteopenia. No significant change in subtle superior endplate deformity of T2, inferior endplate wedge deformity of T5 and high-grade vertebral plana deformity of T6. No new vertebral fractures. Disc spaces: Mild to moderate multilevel disc space height loss and osteophytosis throughout the thoracic spine, worst at the lower levels.  IMPRESSION: 1. No noncontrast CT findings of the chest to explain unintended weight loss. No evidence of mass or lymphadenopathy. 2. No significant change in subtle superior endplate deformity of T2, inferior endplate wedge deformity of T5 and high-grade vertebral plana deformity of T6. No new vertebral fractures. 3. Stable, definitively benign small pulmonary nodules for which no further follow-up or characterization is required. 4. Mild cardiomegaly. Aortic Atherosclerosis (ICD10-I70.0). Electronically Signed   By: Delanna Ahmadi M.D.   On: 06/21/2021 13:00   DG CHEST PORT 1 VIEW  Result Date: 06/21/2021 CLINICAL DATA:  Shortness of breath. EXAM: PORTABLE CHEST 1 VIEW COMPARISON:  05/18/2021 FINDINGS: 0459 hours. The lungs are clear without focal pneumonia, edema, pneumothorax or pleural effusion. The cardiopericardial silhouette is within normal limits for size. Bones are diffusely demineralized. Telemetry leads overlie the chest. IMPRESSION: No active disease. Electronically Signed   By: Misty Stanley M.D.   On: 06/21/2021 05:09   DG Chest Portable 1 View  Result Date: 05/18/2021 CLINICAL DATA:  Cough EXAM: PORTABLE CHEST 1 VIEW COMPARISON:  12/08/2020 FINDINGS: Heart size and mediastinal contours are normal. No pleural effusion or edema identified. No airspace opacities identified. The visualized osseous structures appear intact. IMPRESSION: No acute cardiopulmonary abnormalities. Electronically Signed   By: Kerby Moors M.D.   On: 05/18/2021 11:34   DG MOBILE BONE DENSITY  Result Date: 05/13/2021 CLINICAL DATA:  Postmenopausal osteoporosis screening. EXAM: DUAL X-RAY ABSORPTIOMETRY (DXA) FOR BONE MINERAL DENSITY TECHNIQUE: Bone mineral density measurements are performed of the spine, hip, and  forearm, as appropriate, per International Society of Clinical Densitometry recommendations. The pertinent regions of interest are reported below. Non-contributory values are not reported. Images are obtained  for bone mineral density measurement and are not obtained for diagnostic purposes. FINDINGS: Left FEMUR total Bone Mineral Density (BMD):  0.642 g/cm2 Young Adult T-Score: -2.5 Z-Score:  -1.4 Left FOREARM (1/3 RADIUS) Bone Mineral Density (BMD):  0.481 Young Adult T Score:  -3.6 Z Score:  -2.0 Unit: This study was performed at [facility] on the [employer] (S/N [phone]), software version 13.4.2. Scan quality: The scan quality is good. Exclusions: Lumbar spine due to degenerative changes ASSESSMENT: Patient's diagnostic category is OSTEOPOROSIS by WHO Criteria. FRACTURE RISK: INCREASED. FRAX:  Not calculated due to T-score at or below -2.5. COMPARISON: None. RECOMMENDATIONS 1. All patients should optimize calcium and vitamin D intake. 2. Consider FDA-approved medical therapies in postmenopausal women and men aged 34 years and older, based on the following: - A hip or vertebral (clinical or morphometric) fracture - T-score less than or equal to -2.5 at the femoral neck or spine after appropriate evaluation to exclude secondary causes - Low bone mass (T-score between -1.0 and -2.5 at the femoral neck or spine) and a 10-year probability of a hip fracture greater than or equal to 3% or a 10-year probability of a major osteoporosis-related fracture greater than or equal to 20% based on the [place]-adapted WHO algorithm - Clinician judgment and/or patient preferences may indicate treatment for people with 10-year fracture probabilities above or below these levels 3. Patients with diagnosis of osteoporosis or at high risk for fracture should have regular bone mineral density tests. For patients eligible for Medicare, routine testing is allowed once every 2 years. The testing frequency can be increased to one year for patients who have rapidly progressing disease, those who are receiving or discontinuing medical therapy to restore bone mass, or have additional risk factors. Electronically Signed   By: Marin Olp  M.D.   On: 05/13/2021 11:16      Results/Tests Pending at Time of Discharge: none   Discharge Medications:  Allergies as of 06/24/2021   No Known Allergies      Medication List     STOP taking these medications    rifaximin 550 MG Tabs tablet Commonly known as: XIFAXAN       TAKE these medications    acetaminophen 325 MG tablet Commonly known as: TYLENOL Take 650 mg by mouth every 6 (six) hours as needed for mild pain or headache.   albuterol 108 (90 Base) MCG/ACT inhaler Commonly known as: VENTOLIN HFA TAKE 2 PUFFS BY MOUTH EVERY 6 HOURS AS NEEDED FOR WHEEZE OR SHORTNESS OF BREATH What changed: See the new instructions.   amLODipine 5 MG tablet Commonly known as: NORVASC Take 1 tablet (5 mg total) by mouth daily.   Anoro Ellipta 62.5-25 MCG/ACT Aepb Generic drug: umeclidinium-vilanterol INHALE 1 PUFF BY MOUTH EVERY DAY What changed: See the new instructions.   CALCIUM-MAGNESIUM-ZINC PO Take 1 tablet by mouth daily.   CENTRUM SILVER 50+WOMEN PO Take 1 tablet by mouth daily.   clonazePAM 0.5 MG tablet Commonly known as: KLONOPIN TAKE 1 TABLET (0.5 MG TOTAL) BY MOUTH 3 (THREE) TIMES DAILY AS NEEDED FOR UP TO 28 DAYS FOR ANXIETY What changed: See the new instructions.   dicyclomine 20 MG tablet Commonly known as: BENTYL Take 1 tablet (20 mg total) by mouth 3 (three) times daily as needed for up to 30 doses for spasms.  esomeprazole 40 MG capsule Commonly known as: NEXIUM TAKE 1 CAPSULE BY MOUTH EVERY DAY AT NOON What changed: See the new instructions.   feeding supplement Liqd Take 237 mLs by mouth 2 (two) times daily between meals.   gabapentin 600 MG tablet Commonly known as: NEURONTIN Take 1 tablet (600 mg total) by mouth 3 (three) times daily. What changed: when to take this   nicotine 21 mg/24hr patch Commonly known as: NICODERM CQ - dosed in mg/24 hours Place 1 patch (21 mg total) onto the skin daily.   NON FORMULARY O2 use as needed    ondansetron 8 MG disintegrating tablet Commonly known as: ZOFRAN-ODT Take 1 tablet (8 mg total) by mouth every 8 (eight) hours as needed for nausea or vomiting.   potassium chloride 10 MEQ tablet Commonly known as: KLOR-CON Take 1 tablet (10 mEq total) by mouth daily for 30 doses.   PRESCRIPTION MEDICATION Take 1 tablet by mouth in the morning and at bedtime. Antibiotic she received samples of due to cost   promethazine 12.5 MG suppository Commonly known as: PHENERGAN Place 12.5 mg rectally every 4 (four) hours as needed for nausea/vomiting.   QUEtiapine 100 MG tablet Commonly known as: SEROquel Take 1 tablet (100 mg total) by mouth at bedtime.   Vitamin D (Ergocalciferol) 1.25 MG (50000 UNIT) Caps capsule Commonly known as: DRISDOL Take 1 capsule (50,000 Units total) by mouth every 7 (seven) days for 3 doses. Start taking on: June 30, 2021        Discharge Instructions: Please refer to Patient Instructions section of EMR for full details.  Patient was counseled important signs and symptoms that should prompt return to medical care, changes in medications, dietary instructions, activity restrictions, and follow up appointments.   Follow-Up Appointments:   Alen Bleacher, MD 06/24/2021, 6:58 PM PGY-1, [place]

## 2021-06-24 NOTE — Plan of Care (Signed)

## 2021-06-24 NOTE — Progress Notes (Signed)
Karen Casey seen this morning by myself as I am her PCP outpatient. This AM she notes she is feeling nauseous, has been eating Rice Krispies and a banana but would like medication for nausea. Only one BM yesterday, no longer having multiple loose stools. Feeling anxious and wanting to go home as she did not sleep well here. Continuing to have paresthesias in her feet. She can take herself to the bathroom and is wanting to go home.  O: Vitals:   06/24/21 0525 06/24/21 0833  BP: (!) 140/95 (!) 145/89  Pulse: 98 (!) 105  Resp: 19 19  Temp: 97.9 F (36.6 C) 98.4 F (36.9 C)  SpO2: 91%   General: A&O, NAD HEENT: No sign of trauma, EOM grossly intact Cardiac: RRR, no m/r/g Respiratory: CTAB, normal WOB, no w/c/r GI: Soft, NTTP, non-distended  Extremities: NTTP, no peripheral edema. Dorsalis pedis pulses 2+ bilaterally, normal cap refill bilaterally. Neuro: no focal defects moves all four extremities appropriately, no tremor Psych: Appears anxious  A/p: Hypocalcemia Hypomagnesemia Hypokalemia Vitamin D Deficiency - resolving with repletion, likely 2/2 Vitamin D deficiency, will need to continue supplementation outpatient - patient is actually up todate on her mammogram, had normal screening 01/2021 and so will not be due for repeat until 01/2022  Peripheral Neuropathy/Foot Pain - Consider checking B12 and folate with her peripheral neuropathy as well as an A1c. Have attempted to add on lab but if cannot do so this does not need to delay her discharge we can check this outpatient. - could increase gabapentin to TID, discussed with admitting team or we can consider outpatient  Osteoporosis- working on reclast infusion outpatient, continue Vit D and calcium supplementation  Dispo: she is stable for discharge to home, arrange f/u with me outpatient next week  Discussed with Dr Andria Frames and resident team.  Yehuda Savannah MD

## 2021-06-24 NOTE — Discharge Instructions (Addendum)
Dear Karen Casey,  Thank you for letting us participate in your care.   POST-HOSPITAL & CARE INSTRUCTIONS We started you on weekly vitamin D.  We increased your gabapentin to 600 mg 3 times daily.   Go to your follow up appointments (listed below)   DOCTOR'S APPOINTMENT   Future Appointments  Date Time Provider [place]  06/29/2021  9:15 AM Bobette Mo, PT [facility]  06/29/2021 11:15 AM Edrick Kins, DPM [facility]  07/03/2021  8:30 AM Pray, Norwood Levo, MD [facility]     Take care and be well!  [facility]  [place], [place] [address] [phone]

## 2021-06-25 ENCOUNTER — Telehealth: Payer: Self-pay

## 2021-06-25 DIAGNOSIS — G609 Hereditary and idiopathic neuropathy, unspecified: Secondary | ICD-10-CM

## 2021-06-25 NOTE — Telephone Encounter (Signed)
Patient calls nurse line requesting to urgently speak with Dr. Thompson Grayer. Patient was recently discharged from the hospital yesterday.   Reports that she is having burning, swelling and aching in both feet. Patient is asking what else she can do for this discomfort. Patient states that she continues to have weakness and has difficulties getting to the bathroom.   Spoke with Dr. Andria Frames regarding patient. Requested that I route phone note to him to call patient tomorrow morning.   Advised patient that I have spoken with Dr. Andria Frames and he will reach out tomorrow regarding concerns.   ED precautions given.   Talbot Grumbling, RN

## 2021-06-25 NOTE — Therapy (Signed)
OUTPATIENT PHYSICAL THERAPY EVALUATION   Patient Name: Karen Casey MRN: [id number] DOB:Jul 12, 1955, 66 y.o., female Today's Date: 06/29/2021   PT End of Session - 06/29/21 0945     Visit Number 1    Number of Visits 8    Date for PT Re-Evaluation 08/24/21    Authorization Type AETNA    PT Start Time [phone]   patient arrived late   PT Stop Time 1005    PT Time Calculation (min) 37 min    Activity Tolerance Patient tolerated treatment well    Behavior During Therapy [facility] for tasks assessed/performed             Past Medical History:  Diagnosis Date   Alcohol abuse    last use 03/09/21, marijuana last 03/09/21   Allergy    Anxiety    Cataract 06/09/2012   Right eye and left eye   COPD (chronic obstructive pulmonary disease) (HCC)    Depression    Dyspnea    uses oxygen 2L via [place] prn   GERD (gastroesophageal reflux disease)    Headache    Hypertension    Neuromuscular disorder ([place])    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   Seizure ([place])    08/2020 per patient   Seizures ([place])    xanax withdrawl- December 2013   Urinary incontinence 06/09/2012   Past Surgical History:  Procedure Laterality Date   APPENDECTOMY     BIOPSY  01/04/2021   Procedure: BIOPSY;  Surgeon: Yetta Flock, MD;  Location: [place];  Service: Gastroenterology;;   BIOPSY  03/12/2021   Procedure: BIOPSY;  Surgeon: Irving Copas., MD;  Location: [place];  Service: Gastroenterology;;   CATARACT EXTRACTION  06/09/2012   Left eye   COLONOSCOPY WITH PROPOFOL N/A 03/12/2021   Procedure: COLONOSCOPY WITH PROPOFOL;  Surgeon: Irving Copas., MD;  Location: [place];  Service: Gastroenterology;  Laterality: N/A;   ESOPHAGOGASTRODUODENOSCOPY (EGD) WITH PROPOFOL N/A 01/04/2021   Procedure: ESOPHAGOGASTRODUODENOSCOPY (EGD) WITH PROPOFOL;  Surgeon: Yetta Flock, MD;  Location: [place];  Service: Gastroenterology;  Laterality: N/A;   left  shoulder dislocation  Sept 2011   POLYPECTOMY  03/12/2021   Procedure: POLYPECTOMY;  Surgeon: Mansouraty, Telford Nab., MD;  Location: [place];  Service: Gastroenterology;;   Patient Active Problem List   Diagnosis Date Noted   Hypocalcemia 06/21/2021   Idiopathic peripheral neuropathy    Headache 05/29/2021   Seborrheic keratoses 05/29/2021   Hepatic steatosis 01/05/2021   Hypokalemia 01/02/2021   Chronic obstructive pulmonary disease, group B, by Global Initiative for Chronic Obstructive Lung Disease 2017 classification ([place]) 12/09/2020   Chronic pain of breast 11/12/2020   Compression fracture of body of thoracic vertebra ([place]) 11/06/2020   Drug withdrawal seizure with complication ([place]) 95/18/8416   SIADH (syndrome of inappropriate ADH production) ([place]) 10/12/2020   Other chest pain    Hypomagnesemia    Long term (current) use of anticoagulants    Protein-calorie malnutrition ([place]) 09/17/2020   Hyponatremia    Paroxysmal atrial fibrillation (HCC)    Shortness of breath    Alcohol withdrawal ([place]) 09/10/2020   Pulmonary nodules/lesions, multiple 07/14/2020   Chronic left shoulder pain 06/22/2020   HTN (hypertension) 06/02/2018   Body mass index (BMI) 31.0-31.9, adult 03/29/2013   Diarrhea 10/20/2012   Anxiety disorder 06/12/2012   Alcohol use 05/28/2012   Benzodiazepine withdrawal without complication ([place]) 60/63/0160   Tobacco abuse 02/17/2012   GERD (gastroesophageal reflux disease)  02/04/2012   Depression 02/04/2012    PCP: [name], MD  REFERRING PROVIDER: [name], MD  REFERRING DIAG: Compression fracture of body of thoracic vertebra   THERAPY DIAG:  Pain in thoracic spine  Chronic bilateral low back pain, unspecified whether sciatica present  Abnormal posture  Muscle weakness (generalized)  Other abnormalities of gait and mobility  ONSET DATE: approximately 5-6 months ago  SUBJECTIVE:  SUBJECTIVE STATEMENT: Patient reports  fracture in her mid-lower back that has been ongoing for 5-6 months. She thinks a seizure in the hospital caused the fracture. Back pain is constant. Patient reports no agg or ease factors, stating pain is just there. Patient also reports difficulty walking and burning of both feet. She will occasionally use cane or walker at home due to balance and walking difficulties.  PERTINENT HISTORY: Anxiety/depression, seizures, previous left shoulder injury limiting motion, thoracic compression fracture, osteoporosis  PAIN:  Are you having pain? Yes NPRS scale: 5/10 Pain location: Mid Back Pain orientation: Mid, Lower PAIN TYPE: Chronic Pain description: "just a pain that stays there"  Aggravating factors: None reported Relieving factors: None reported  PRECAUTIONS: Fall  WEIGHT BEARING RESTRICTIONS No  FALLS:  Has patient fallen in last 6 months? Yes, Number of falls: 3  LIVING ENVIRONMENT: Lives with: lives with their family Lives in: House/apartment Stairs: Yes; External: 7 steps; can reach both Has following equipment at home: Single point cane, Walker - 2 wheeled, and None  OCCUPATION: Unemployed  PLOF: Independent  PATIENT GOALS: Pain relief   OBJECTIVE:  DIAGNOSTIC FINDINGS:  CT: T5 wedge fracture  PATIENT SURVEYS:  FOTO 40% functional status  SCREENING FOR RED FLAGS: Negative  COGNITION: Overall cognitive status: Within functional limits for tasks assessed     SENSATION:  Light touch: Appears intact  MUSCLE LENGTH: Not assessed  POSTURE:  Significant increase in thoracic kyphosis, rounded shoulder, forward head  PALPATION: Generalized tenderness throughout thoracic and lumbar paraspinal regions  UE AROM:  Left shoulder AROM limited due to previous injury  MMT Right 06/29/2021 Left 06/29/2021  Shoulder flexion 140 100  Shoulder extension 45 30  Shoulder abduction 120 90  Functional IR L5 Sacrum  Functional ER C7 Ear    LE MMT:  MMT  Right 06/29/2021 Left 06/29/2021  Hip flexion 4- 4-  Hip extension 3 3  Hip abduction 3 3  Knee flexion 4 4  Knee extension 4 4  Ankle dorsiflexion 4 4  Ankle plantarflexion 3- 3-  Ankle inversion 4 4  Ankle eversion 4 4   UE MMT:  MMT Right 06/29/2021 Left 06/29/2021  Shoulder flexion 4 4-  Shoulder ER 4 4-  Shoulder abduction 4 4-  Periscap musculature 4- 4-   FUNCTIONAL TESTS:  Sit to stand: patient unable to perform without BUE support on arm rests  Patient with increased sway during romberg stance, patient reports fear of falling and balance impairment  GAIT: Distance walked: 50 Assistive device utilized: None Level of assistance: CGA Comments: HHA for balance and steadying with gait; patient with unsteady gait, decreased gait speed, report of fear of falling with walking   TODAY'S TREATMENT  Supine shoulder flexion AAROM with dowel x 10 Supine shoulder horizontal abduction with yellow x 10 Hooklying clamshell with yellow x 10 SLR x 10 Bridge x 10 Seated scapular retraction x 10 Seated heel toe raise x 10   PATIENT EDUCATION:  Education details: Exam findings, POC, HEP Person educated: Patient Education method: Explanation, Demonstration, Tactile cues, Verbal  cues, and Handouts Education comprehension: verbalized understanding, returned demonstration, verbal cues required, tactile cues required, and needs further education  HOME EXERCISE PROGRAM: Access Code: [email or web address]   ASSESSMENT: CLINICAL IMPRESSION: Patient is a 66 y.o. female who was seen today for physical therapy evaluation and treatment for chronic back pain related to thoracic compression factor. Patient also demonstrates balance and gait deficits as well as bilateral foot pain consistent with neuropathy. Objective impairments include Abnormal gait, decreased activity tolerance, decreased balance, difficulty walking, decreased ROM, decreased strength, impaired sensation, and pain. These impairments  are limiting patient from cleaning, community activity, meal prep, occupation, laundry, and shopping. Personal factors including Behavior pattern, Fitness, Past/current experiences, and Time since onset of injury/illness/exacerbation are also affecting patient's functional outcome. Patient will benefit from skilled PT to address above impairments and improve overall function.  REHAB POTENTIAL: Fair  CLINICAL DECISION MAKING: Stable/uncomplicated  EVALUATION COMPLEXITY: Low   GOALS: Goals reviewed with patient? Yes  SHORT TERM GOALS:  STG Name Target Date Goal status  1 Patient will be I with initial HEP in order to progress with therapy. Baseline: provided at eval 07/27/2021 INITIAL  2 PT will review FOTO with patient by 3rd visit in order to understand expected progress and outcome with therapy. Baseline: assessed at eval 07/27/2021 INITIAL  3 Patient will report back pain </= 3/10 in order to reduce functional limitations Baseline: 5/10 07/27/2021 INITIAL   LONG TERM GOALS:   LTG Name Target Date Goal status  1 Patient will be I with final HEP to maintain progress from PT. Baseline: provided at eval 08/24/2021 INITIAL  2 Patient will report >/= 54% status on FOTO to indicate improved functional ability. Baseline: 40% functional status 08/24/2021 INITIAL  3 Patient will demonstrate periscapular strength grossly 4/5 MMT to improve postural control and reduce back pain. Baseline: grossly 4-/5 MMT 08/24/2021 INITIAL  4 Patient will demonstrate left shoulder elevation >/= 120 deg to improve overhead reach and dressing. Baseline: 100 deg 08/24/2021 INITIAL    PLAN: PT FREQUENCY: 1x/week  PT DURATION: 8 weeks  PLANNED INTERVENTIONS: Therapeutic exercises, Therapeutic activity, Neuro Muscular re-education, Balance training, Gait training, Patient/Family education, Joint mobilization, Stair training, Aquatic Therapy, Dry Needling, Spinal mobilization, Cryotherapy, Moist heat, Taping, and  Manual therapy  PLAN FOR NEXT SESSION: Review HEP and progress PRN, postural strengthening, balance training   Hilda Blades, PT, DPT, LAT, ATC 06/29/21  10:49 AM Phone: [phone] Fax: [phone]

## 2021-06-26 MED ORDER — CAPSAICIN 0.1 % EX CREA: TOPICAL_CREAM | CUTANEOUS | 3 refills | Status: DC

## 2021-06-26 NOTE — Telephone Encounter (Signed)
Called and discussed.  Her two concerns were: What was my diagnosis in the hospital?  Answered low calcium caused by Vit D defeciency. What can you do for my burning feet?  Neuropathy is a separate problem that she should discuss with Dr. Thompson Grayer.  I did refill her Capsaicin cream for her feet.

## 2021-06-29 ENCOUNTER — Other Ambulatory Visit: Payer: Self-pay

## 2021-06-29 ENCOUNTER — Encounter: Payer: Self-pay | Admitting: Physical Therapy

## 2021-06-29 ENCOUNTER — Ambulatory Visit: Payer: 59 | Attending: Family Medicine | Admitting: Physical Therapy

## 2021-06-29 ENCOUNTER — Ambulatory Visit (INDEPENDENT_AMBULATORY_CARE_PROVIDER_SITE_OTHER): Payer: Self-pay | Admitting: Podiatry

## 2021-06-29 DIAGNOSIS — G5793 Unspecified mononeuropathy of bilateral lower limbs: Secondary | ICD-10-CM

## 2021-06-29 DIAGNOSIS — R2689 Other abnormalities of gait and mobility: Secondary | ICD-10-CM | POA: Diagnosis not present

## 2021-06-29 DIAGNOSIS — G8929 Other chronic pain: Secondary | ICD-10-CM | POA: Insufficient documentation

## 2021-06-29 DIAGNOSIS — S22000A Wedge compression fracture of unspecified thoracic vertebra, initial encounter for closed fracture: Secondary | ICD-10-CM | POA: Diagnosis not present

## 2021-06-29 DIAGNOSIS — M545 Low back pain, unspecified: Secondary | ICD-10-CM | POA: Diagnosis not present

## 2021-06-29 DIAGNOSIS — M6281 Muscle weakness (generalized): Secondary | ICD-10-CM | POA: Insufficient documentation

## 2021-06-29 DIAGNOSIS — M546 Pain in thoracic spine: Secondary | ICD-10-CM | POA: Diagnosis not present

## 2021-06-29 DIAGNOSIS — R293 Abnormal posture: Secondary | ICD-10-CM | POA: Insufficient documentation

## 2021-06-29 DIAGNOSIS — M5416 Radiculopathy, lumbar region: Secondary | ICD-10-CM

## 2021-06-29 MED ORDER — DICLOFENAC SODIUM 1 % EX CREA: 1.0000 "application " | TOPICAL_CREAM | Freq: Two times a day (BID) | CUTANEOUS | 2 refills | Status: DC | PRN

## 2021-06-29 NOTE — Patient Instructions (Signed)
Access Code: [email or web address] URL: [email or web address] Date: 06/29/2021 Prepared by: Hilda Blades  Exercises Supine Shoulder Flexion Extension AAROM with Dowel - 2-3 x daily - 7 x weekly - 10 reps Supine Shoulder Horizontal Abduction with Resistance - 2-3 x daily - 7 x weekly - 10 reps Hooklying Clamshell with Resistance - 2-3 x daily - 7 x weekly - 10 reps Small Range Straight Leg Raise - 2-3 x daily - 7 x weekly - 10 reps Bridge - 2-3 x daily - 7 x weekly - 10 reps Seated Scapular Retraction - 5 x daily - 7 x weekly - 10 reps Seated Heel Toe Raises - 5 x daily - 7 x weekly - 20 reps

## 2021-06-29 NOTE — Progress Notes (Signed)
HPI: 66 y.o. female presenting today for acute onset of burning and tingling to the feet bilateral.  Patient states that about 2 weeks ago she began to get severe burning sensation to the bilateral forefoot.  She does have history of lumbar radiculopathy and spine fractures.  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  She presents for further treatment and evaluation  Past Medical History:  Diagnosis Date   Alcohol abuse    last use 03/09/21, marijuana last 03/09/21   Allergy    Anxiety    Cataract 06/09/2012   Right eye and left eye   COPD (chronic obstructive pulmonary disease) (HCC)    Depression    Dyspnea    uses oxygen 2L via [place] prn   GERD (gastroesophageal reflux disease)    Headache    Hypertension    Neuromuscular disorder ([place])    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   Seizure ([place])    08/2020 per patient   Seizures ([place])    xanax withdrawl- December 2013   Urinary incontinence 06/09/2012    Past Surgical History:  Procedure Laterality Date   APPENDECTOMY     BIOPSY  01/04/2021   Procedure: BIOPSY;  Surgeon: Yetta Flock, MD;  Location: [place];  Service: Gastroenterology;;   BIOPSY  03/12/2021   Procedure: BIOPSY;  Surgeon: Irving Copas., MD;  Location: [place];  Service: Gastroenterology;;   CATARACT EXTRACTION  06/09/2012   Left eye   COLONOSCOPY WITH PROPOFOL N/A 03/12/2021   Procedure: COLONOSCOPY WITH PROPOFOL;  Surgeon: Irving Copas., MD;  Location: [place];  Service: Gastroenterology;  Laterality: N/A;   ESOPHAGOGASTRODUODENOSCOPY (EGD) WITH PROPOFOL N/A 01/04/2021   Procedure: ESOPHAGOGASTRODUODENOSCOPY (EGD) WITH PROPOFOL;  Surgeon: Yetta Flock, MD;  Location: [place];  Service: Gastroenterology;  Laterality: N/A;   left shoulder dislocation  Sept 2011   POLYPECTOMY  03/12/2021   Procedure: POLYPECTOMY;  Surgeon: Mansouraty, Telford Nab., MD;  Location: [place];  Service: Gastroenterology;;    No Known Allergies   Physical Exam: General: The patient is alert and oriented x3 in no acute distress.  Dermatology: Skin is warm, dry and supple bilateral lower extremities. Negative for open lesions or macerations.  Vascular: Palpable pedal pulses bilaterally. Capillary refill within normal limits.  Negative for any significant edema or erythema  Neurological: Light touch and protective threshold grossly intact.  There is hypersensitivity even with light touch especially to the left forefoot  Musculoskeletal Exam: No pedal deformities noted  Assessment: 1.  Lumbar radiculopathy 2.  Peripheral neuropathy bilateral feet likely secondary to back pathology   Plan of Care:  1. Patient evaluated.  2.  Patient is already taking gabapentin 600 mg 2 times daily as per her PCP.  She states that she actually takes 2 pills 2 times daily which would be 2400 mg/day.  Patient says that this does not help alleviate any of her symptoms 3.  I explained to the patient that her symptoms are likely being caused from her back pathology. 4.  Recommend OTC Voltaren 1% gel.  She asked that a prescription be sent to the pharmacy.  Prescription was sent to the pharmacy 5.  Return to clinic as needed      Edrick Kins, DPM [facility]  Dr. Edrick Kins, DPM    [address]  [place], [place] [address]                Office [phone]  Fax [phone]

## 2021-07-02 ENCOUNTER — Other Ambulatory Visit: Payer: Self-pay | Admitting: Student

## 2021-07-02 ENCOUNTER — Encounter: Payer: Self-pay | Admitting: Family Medicine

## 2021-07-02 NOTE — Progress Notes (Signed)
° ° °  SUBJECTIVE:   CHIEF COMPLAINT / HPI:   Hospital follow up- hypocalcemia, hypomagnesemia, and vitamin D deficiency. Currently taking 50K IU vit D weekly. Taking a multivitamin daily but not taking specific calcium.  Peripheral neuropathy- just bilateral feet, no sciatica symptoms or weakness. No calf or thigh pain or claudication symptoms. NO blue or black discoloration of feet or toes.   Anxiety- taking Klonopin 0.5 mg TID. Psych referral pending. Was waiting for new insurance for 2023.  Compression fracture- saw PT on 06/29/21, planning for PT once weekly x8 weeks. Felt like it was helpful and did not hurt. Still having some issues with balance, has shower chair in shower and no falls at home. No bowel or bladder incontinence or saddle anesthesias.  HTN- on amlodipine 5mg  taking daily. No side effects noted.  Alcohol use- last drank Tuesday, 2 drinks. Helps her feel relaxed. Notes poor appetite, no worsening abdominal pain but continued nausea.  PERTINENT  PMH / PSH: anxiety, h/o alcohol use disorder. H/o withdrawal seizures from benzodiazepines, history of thoracic compression fracture, osteoporosis, HTN, vitamin D deficiency, COPD stage B, pAfib no longer on AC, h/o SIADH  OBJECTIVE:   BP 128/88    Pulse (!) 55    Ht 5\' 3"  (1.6 m)    Wt 166 lb 3.2 oz (75.4 kg)    SpO2 93%    BMI 29.44 kg/m   General: A&O, NAD HEENT: No sign of trauma, EOM grossly intact Cardiac: RRR, no m/r/g Respiratory: CTAB, normal WOB, no w/c/r GI: Soft, NTTP, non-distended  Spine: bilateral thoracic and lumbar paraspinal tenderness to palpation, no midline spinal tenderness or induration or erythema Extremities: NTTP, no peripheral edema. 2+ dorsalis pedis pulses bilaterally, very tender on bilateral feet to light palpation. Neuro: antalgic gait, moves all four extremities appropriately. Psych: Appropriate mood and affect   ASSESSMENT/PLAN:   Hypomagnesemia Recheck Mg today  Hypocalcemia -recheck  calcium today  HTN (hypertension) Normotensive today, continue home medications  Compression fracture of body of thoracic vertebra (HCC) - continue calcium and vitamin D - working with physical therapy weekly  Alcohol use - discussed importance of abstinence  Vitamin D deficiency Continue 50K IU weekly, then will start 2,000 IU daily and repeat level in 3 months  Anxiety disorder Will check on referral to psychiatry, continue TID Klonopin 0.5mg  for now, plan to wean to 0.5mg  BID at next appt for continued slow taper  Peripheral neuropathy - checking vitamin B12 level, has been on PPI for long time - normal A1c last year     [name], MD [place]

## 2021-07-03 ENCOUNTER — Other Ambulatory Visit: Payer: Self-pay

## 2021-07-03 ENCOUNTER — Ambulatory Visit (INDEPENDENT_AMBULATORY_CARE_PROVIDER_SITE_OTHER): Payer: 59 | Admitting: Family Medicine

## 2021-07-03 DIAGNOSIS — E440 Moderate protein-calorie malnutrition: Secondary | ICD-10-CM

## 2021-07-03 DIAGNOSIS — E559 Vitamin D deficiency, unspecified: Secondary | ICD-10-CM | POA: Insufficient documentation

## 2021-07-03 DIAGNOSIS — I10 Essential (primary) hypertension: Secondary | ICD-10-CM | POA: Diagnosis not present

## 2021-07-03 DIAGNOSIS — Z789 Other specified health status: Secondary | ICD-10-CM

## 2021-07-03 DIAGNOSIS — S22000A Wedge compression fracture of unspecified thoracic vertebra, initial encounter for closed fracture: Secondary | ICD-10-CM

## 2021-07-03 DIAGNOSIS — G6289 Other specified polyneuropathies: Secondary | ICD-10-CM | POA: Diagnosis not present

## 2021-07-03 DIAGNOSIS — F419 Anxiety disorder, unspecified: Secondary | ICD-10-CM

## 2021-07-03 DIAGNOSIS — R69 Illness, unspecified: Secondary | ICD-10-CM | POA: Diagnosis not present

## 2021-07-03 NOTE — Assessment & Plan Note (Signed)
Recheck Mg today

## 2021-07-03 NOTE — Assessment & Plan Note (Signed)
Normotensive today, continue home medications

## 2021-07-03 NOTE — Assessment & Plan Note (Signed)
Continue 50K IU weekly, then will start 2,000 IU daily and repeat level in 3 months

## 2021-07-03 NOTE — Assessment & Plan Note (Signed)
-   discussed importance of abstinence

## 2021-07-03 NOTE — Patient Instructions (Addendum)
It was wonderful to see you today.  Please bring ALL of your medications with you to every visit.   Today we talked about:  - We will recheck your lab work today - Calcium 1200 mg daily and Vit D 2000 IU daily after you finish the weekly vitamin D tabs - We will see you back in February!   Thank you for choosing [facility].   Please call [phone] with any questions about today's appointment.  Please be sure to schedule follow up at the front  desk before you leave today.   Yehuda Savannah, MD  Family Medicine

## 2021-07-03 NOTE — Assessment & Plan Note (Signed)
Will check on referral to psychiatry, continue TID Klonopin 0.5mg  for now, plan to wean to 0.5mg  BID at next appt for continued slow taper

## 2021-07-03 NOTE — Assessment & Plan Note (Signed)
-   continue calcium and vitamin D - working with physical therapy weekly

## 2021-07-03 NOTE — Assessment & Plan Note (Signed)
-  recheck calcium today

## 2021-07-03 NOTE — Assessment & Plan Note (Signed)
-   checking vitamin B12 level, has been on PPI for long time - normal A1c last year

## 2021-07-04 LAB — BASIC METABOLIC PANEL
BUN/Creatinine Ratio: 6 — ABNORMAL LOW (ref 12–28)
BUN: 5 mg/dL — ABNORMAL LOW (ref 8–27)
CO2: 24 mmol/L (ref 20–29)
Calcium: 9 mg/dL (ref 8.7–10.3)
Chloride: 91 mmol/L — ABNORMAL LOW (ref 96–106)
Creatinine, Ser: 0.78 mg/dL (ref 0.57–1.00)
Glucose: 107 mg/dL — ABNORMAL HIGH (ref 70–99)
Potassium: 4.4 mmol/L (ref 3.5–5.2)
Sodium: 131 mmol/L — ABNORMAL LOW (ref 134–144)
eGFR: 85 mL/min/{1.73_m2} (ref >59)

## 2021-07-04 LAB — MAGNESIUM: Magnesium: 1.6 mg/dL (ref 1.6–2.3)

## 2021-07-04 LAB — VITAMIN B12: Vitamin B-12: 848 pg/mL (ref 232–1245)

## 2021-07-06 ENCOUNTER — Other Ambulatory Visit: Payer: Self-pay | Admitting: Family Medicine

## 2021-07-06 ENCOUNTER — Ambulatory Visit: Payer: 59 | Admitting: Physical Therapy

## 2021-07-06 DIAGNOSIS — F419 Anxiety disorder, unspecified: Secondary | ICD-10-CM

## 2021-07-08 ENCOUNTER — Other Ambulatory Visit: Payer: Self-pay | Admitting: Family Medicine

## 2021-07-08 DIAGNOSIS — F419 Anxiety disorder, unspecified: Secondary | ICD-10-CM

## 2021-07-10 NOTE — Therapy (Signed)
OUTPATIENT PHYSICAL THERAPY TREATMENT NOTE   Patient Name: Karen Casey MRN: [id number] DOB:1956/02/21, 66 y.o., female Today's Date: 07/13/2021  PCP: [name], MD REFERRING PROVIDER: [name], MD   PT End of Session - 07/13/21 0920     Visit Number 2    Number of Visits 8    Date for PT Re-Evaluation 08/24/21    Authorization Type AETNA    PT Start Time 0915    PT Stop Time 1000    PT Time Calculation (min) 45 min    Equipment Utilized During Treatment Gait belt    Activity Tolerance Patient limited by pain    Behavior During Therapy [facility] for tasks assessed/performed             Past Medical History:  Diagnosis Date   Alcohol abuse    last use 03/09/21, marijuana last 03/09/21   Allergy    Anxiety    Cataract 06/09/2012   Right eye and left eye   COPD (chronic obstructive pulmonary disease) ([place])    Depression    Dyspnea    uses oxygen 2L via [place] prn   GERD (gastroesophageal reflux disease)    Headache    Hypertension    Long term (current) use of anticoagulants    Neuromuscular disorder ([place])    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   Seizure ([place])    08/2020 per patient   Seizures ([place])    xanax withdrawl- December 2013   Urinary incontinence 06/09/2012   Past Surgical History:  Procedure Laterality Date   APPENDECTOMY     BIOPSY  01/04/2021   Procedure: BIOPSY;  Surgeon: Yetta Flock, MD;  Location: [place];  Service: Gastroenterology;;   BIOPSY  03/12/2021   Procedure: BIOPSY;  Surgeon: Irving Copas., MD;  Location: [place];  Service: Gastroenterology;;   CATARACT EXTRACTION  06/09/2012   Left eye   COLONOSCOPY WITH PROPOFOL N/A 03/12/2021   Procedure: COLONOSCOPY WITH PROPOFOL;  Surgeon: Irving Copas., MD;  Location: [place];  Service: Gastroenterology;  Laterality: N/A;   ESOPHAGOGASTRODUODENOSCOPY (EGD) WITH PROPOFOL N/A 01/04/2021   Procedure: ESOPHAGOGASTRODUODENOSCOPY  (EGD) WITH PROPOFOL;  Surgeon: Yetta Flock, MD;  Location: [place];  Service: Gastroenterology;  Laterality: N/A;   left shoulder dislocation  Sept 2011   POLYPECTOMY  03/12/2021   Procedure: POLYPECTOMY;  Surgeon: Mansouraty, Telford Nab., MD;  Location: [facility] ENDOSCOPY;  Service: Gastroenterology;;   Patient Active Problem List   Diagnosis Date Noted   Vitamin D deficiency 07/03/2021   Hypocalcemia 06/21/2021   Peripheral neuropathy    Seborrheic keratoses 05/29/2021   Hepatic steatosis 01/05/2021   Hypokalemia 01/02/2021   Chronic obstructive pulmonary disease, group B, by Global Initiative for Chronic Obstructive Lung Disease 2017 classification ([place]) 12/09/2020   Chronic pain of breast 11/12/2020   Compression fracture of body of thoracic vertebra ([place]) 11/06/2020   Drug withdrawal seizure with complication ([place]) 24/82/5003   SIADH (syndrome of inappropriate ADH production) ([place]) 10/12/2020   Hypomagnesemia    Protein-calorie malnutrition ([place]) 09/17/2020   Hyponatremia    Paroxysmal atrial fibrillation (H[facility])    Shortness of breath    Alcohol withdrawal ([place]) 09/10/2020   Pulmonary nodules/lesions, multiple 07/14/2020   HTN (hypertension) 06/02/2018   Body mass index (BMI) 31.0-31.9, adult 03/29/2013   Diarrhea 10/20/2012   Anxiety disorder 06/12/2012   Alcohol use 05/28/2012   Benzodiazepine withdrawal without complication ([place]) 70/48/8891   Tobacco abuse 02/17/2012  GERD (gastroesophageal reflux disease) 02/04/2012   Depression 02/04/2012    REFERRING PROVIDER: [name], MD   REFERRING DIAG: Compression fracture of body of thoracic vertebra   THERAPY DIAG:  Pain in thoracic spine  Chronic bilateral low back pain, unspecified whether sciatica present  Abnormal posture  Muscle weakness (generalized)  Other abnormalities of gait and mobility  ONSET DATE: approximately 5-6 months ago  PERTINENT HISTORY: Anxiety/depression, seizures, previous  left shoulder injury limiting motion, thoracic compression fracture, osteoporosis  PRECAUTIONS: Fall  WEIGHT BEARING RESTRICTIONS: No   SUBJECTIVE: Patient reports she has neuropathy and that causes her feet to be very painful. She reports that her back hurts a little bit, but not as bad as her feet.  PAIN:  Are you having pain? Yes NPRS scale: 5/10 Pain location: Mid Back Pain orientation: Mid, Lower PAIN TYPE: Chronic Pain description: "just a pain that stays there"  Aggravating factors: None reported Relieving factors: None reported  PATIENT GOALS: Pain relief   OBJECTIVE: (BOLDED MEASURES ASSESSED THIS VISIT) PATIENT SURVEYS:  FOTO 40% functional status   MUSCLE LENGTH: Not assessed   POSTURE:  Significant increase in thoracic kyphosis, rounded shoulder, forward head   UE AROM:   Left shoulder AROM limited due to previous injury   MMT Right 06/29/2021 Left 06/29/2021  Shoulder flexion 140 100  Shoulder extension 45 30  Shoulder abduction 120 90  Functional IR L5 Sacrum  Functional ER C7 Ear      LE MMT:   MMT Right 06/29/2021 Left 06/29/2021  Hip flexion 4- 4-  Hip extension 3 3  Hip abduction 3 3  Knee flexion 4 4  Knee extension 4 4  Ankle dorsiflexion 4 4  Ankle plantarflexion 3- 3-  Ankle inversion 4 4  Ankle eversion 4 4    UE MMT:   MMT Right 06/29/2021 Left 06/29/2021  Shoulder flexion 4 4-  Shoulder ER 4 4-  Shoulder abduction 4 4-  Periscap musculature 4- 4-    FUNCTIONAL TESTS:  Sit to stand: patient unable to perform without BUE support on arm rests, she is not willing to try standing without UE support   Patient with increased sway during romberg stance, patient reports fear of falling and balance impairment   GAIT: Assistive device utilized: None Level of assistance: CGA Comments: HHA for balance and steadying with gait; patient with unsteady gait, decreased gait speed, report of fear of falling with walking     TODAY'S  TREATMENT   07/13/2021: NuStep L5 x 5 min with UE/LE while taking subjective LAQ with 3# 2 x 20 each Seated alternating march with 3# 2 x 20 Seated heel toe raises with 3# 2 x 20 LTR 5 x 5 sec each Bridge 2 x 15 SLR 2 x 10 Hooklying clamshell with red 2 x 15 Seated row with red 2 x 20 Sit to stand 2 x 5 - using BUE support to stand, then lowering without UE support and focusing on slowly lowering to the chair with control Romberg stance at counter 4 x 30 sec - patient requires frequent UE support due to fear of falling    06/29/2021 (eval): Supine shoulder flexion AAROM with dowel x 10 Supine shoulder horizontal abduction with yellow x 10 Hooklying clamshell with yellow x 10 SLR x 10 Bridge x 10 Seated scapular retraction x 10 Seated heel toe raise x 10     PATIENT EDUCATION:  Education details: HEP, FOTO, using AD for mobility Person educated: Patient  Education method: Explanation, Demonstration, Tactile cues, Verbal cues Education comprehension: verbalized understanding, returned demonstration, verbal cues required, tactile cues required, and needs further education   HOME EXERCISE PROGRAM: Access Code: [email or web address]     ASSESSMENT: CLINICAL IMPRESSION: Patient with fair tolerance for therapy this visit, no adverse effects reported. Therapy focused on progressing strength and balance. She continues to report foot pain that limits function and she requires HHA for ambulation as she did not bring an assistive device with her. Patient requires consistent cueing for proper exercise technique and remaining on task. She exhibited quick fatigue indicating endurance deficit. She was hesitant to try and stand without using UE support so focused primarily on controlling decent to the chair with sit to stands to emphasize LE strengthening. She continues to demonstrate increased sway with all standing balance tasks. Patient was encouraged to utilize assistive device for mobility due to  unsteadiness on feet and fall risk. Patient would benefit from continued skilled PT to progress her mobility and strength in order to maximize her functional ability.     GOALS: Goals reviewed with patient? Yes   SHORT TERM GOALS:   STG Name Target Date Goal status  1 Patient will be I with initial HEP in order to progress with therapy. Baseline: provided at eval 07/27/2021 INITIAL  2 PT will review FOTO with patient by 3rd visit in order to understand expected progress and outcome with therapy. Baseline: reviewed 2nd visit 07/27/2021 ACHIEVED  3 Patient will report back pain </= 3/10 in order to reduce functional limitations Baseline: 5/10 07/27/2021 INITIAL    LONG TERM GOALS:    LTG Name Target Date Goal status  1 Patient will be I with final HEP to maintain progress from PT. Baseline: provided at eval 08/24/2021 INITIAL  2 Patient will report >/= 54% status on FOTO to indicate improved functional ability. Baseline: 40% functional status 08/24/2021 INITIAL  3 Patient will demonstrate periscapular strength grossly 4/5 MMT to improve postural control and reduce back pain. Baseline: grossly 4-/5 MMT 08/24/2021 INITIAL  4 Patient will demonstrate left shoulder elevation >/= 120 deg to improve overhead reach and dressing. Baseline: 100 deg 08/24/2021 INITIAL      PLAN: PT FREQUENCY: 1x/week   PT DURATION: 8 weeks   PLANNED INTERVENTIONS: Therapeutic exercises, Therapeutic activity, Neuro Muscular re-education, Balance training, Gait training, Patient/Family education, Joint mobilization, Stair training, Aquatic Therapy, Dry Needling, Spinal mobilization, Cryotherapy, Moist heat, Taping, and Manual therapy   PLAN FOR NEXT SESSION: Review HEP and progress PRN, postural strengthening, balance training     Hilda Blades, PT, DPT, LAT, ATC 07/13/21  10:02 AM Phone: [phone] Fax: [phone]

## 2021-07-13 ENCOUNTER — Encounter: Payer: Self-pay | Admitting: Physical Therapy

## 2021-07-13 ENCOUNTER — Other Ambulatory Visit: Payer: Self-pay

## 2021-07-13 ENCOUNTER — Ambulatory Visit: Payer: 59 | Admitting: Physical Therapy

## 2021-07-13 DIAGNOSIS — M545 Low back pain, unspecified: Secondary | ICD-10-CM | POA: Diagnosis not present

## 2021-07-13 DIAGNOSIS — M546 Pain in thoracic spine: Secondary | ICD-10-CM

## 2021-07-13 DIAGNOSIS — S22000A Wedge compression fracture of unspecified thoracic vertebra, initial encounter for closed fracture: Secondary | ICD-10-CM | POA: Diagnosis not present

## 2021-07-13 DIAGNOSIS — R293 Abnormal posture: Secondary | ICD-10-CM | POA: Diagnosis not present

## 2021-07-13 DIAGNOSIS — R2689 Other abnormalities of gait and mobility: Secondary | ICD-10-CM | POA: Diagnosis not present

## 2021-07-13 DIAGNOSIS — G8929 Other chronic pain: Secondary | ICD-10-CM | POA: Diagnosis not present

## 2021-07-13 DIAGNOSIS — M6281 Muscle weakness (generalized): Secondary | ICD-10-CM | POA: Diagnosis not present

## 2021-07-14 ENCOUNTER — Other Ambulatory Visit: Payer: Self-pay | Admitting: Family Medicine

## 2021-07-14 DIAGNOSIS — F419 Anxiety disorder, unspecified: Secondary | ICD-10-CM

## 2021-07-15 ENCOUNTER — Telehealth: Payer: Self-pay

## 2021-07-15 NOTE — Telephone Encounter (Signed)
Patient calls nurse line in regards to Capsaicin cream. Patient reports she has been using this cream as prescribed, however her feet have been "peeling" and she has "blisters" on her hands. Patient denies any drainage or foul odor, her hands just "hurt." Patient denies fever or chills.   Patient offered an apt for today for evaluation, however wants to see Dr. Thompson Grayer. Patient has an apt scheduled for 2/3.  Red flags discussed with patient.

## 2021-07-17 ENCOUNTER — Telehealth: Payer: Self-pay

## 2021-07-17 ENCOUNTER — Ambulatory Visit: Payer: 59 | Admitting: Family Medicine

## 2021-07-17 ENCOUNTER — Other Ambulatory Visit: Payer: Self-pay | Admitting: Family Medicine

## 2021-07-17 ENCOUNTER — Other Ambulatory Visit: Payer: Self-pay

## 2021-07-17 NOTE — Telephone Encounter (Signed)
Patient calls again asking for PCP. Patient reports she did not go to the ED or UC. Patient reports she does feel a little bit better. Patient denies any more dizzy spells or feelings of "falling."   Will forward to PCP.

## 2021-07-17 NOTE — Telephone Encounter (Signed)
Patient calls nurse line reporting she missed her apt this morning with PCP because she was late. Patient reports she is really sick and is hopeful we can work her in. Unfortunately, we have no apts left for today as of now. Patient reports she is weak and nauseated. Patient reports she felt dizzy in the shower and was afraid she was going to fall, however did not. Patient reports she ate half of a biscuit, however did not feel any better.   Patient advised to be evaluated at [facility] or ED.   Patient agreed with plan and reports her sister will drive her.

## 2021-07-20 ENCOUNTER — Encounter: Payer: Self-pay | Admitting: Physical Therapy

## 2021-07-20 ENCOUNTER — Other Ambulatory Visit: Payer: Self-pay

## 2021-07-20 ENCOUNTER — Ambulatory Visit: Payer: 59 | Attending: Family Medicine | Admitting: Physical Therapy

## 2021-07-20 DIAGNOSIS — M546 Pain in thoracic spine: Secondary | ICD-10-CM | POA: Diagnosis not present

## 2021-07-20 DIAGNOSIS — R293 Abnormal posture: Secondary | ICD-10-CM | POA: Insufficient documentation

## 2021-07-20 DIAGNOSIS — R2689 Other abnormalities of gait and mobility: Secondary | ICD-10-CM | POA: Diagnosis not present

## 2021-07-20 DIAGNOSIS — M6281 Muscle weakness (generalized): Secondary | ICD-10-CM | POA: Insufficient documentation

## 2021-07-20 DIAGNOSIS — R0902 Hypoxemia: Secondary | ICD-10-CM | POA: Diagnosis not present

## 2021-07-20 DIAGNOSIS — M545 Low back pain, unspecified: Secondary | ICD-10-CM | POA: Insufficient documentation

## 2021-07-20 DIAGNOSIS — G8929 Other chronic pain: Secondary | ICD-10-CM | POA: Diagnosis not present

## 2021-07-20 NOTE — Therapy (Signed)
OUTPATIENT PHYSICAL THERAPY TREATMENT NOTE   Patient Name: Karen Casey MRN: [id number] DOB:24-May-1956, 66 y.o., female Today's Date: 07/20/2021  PCP: [name], MD REFERRING PROVIDER: [name], MD   PT End of Session - 07/20/21 1006     Visit Number 3    Number of Visits 8    Date for PT Re-Evaluation 08/24/21    Authorization Type AETNA    PT Start Time 0919    PT Stop Time 1000    PT Time Calculation (min) 41 min    Activity Tolerance Patient limited by pain    Behavior During Therapy [facility] for tasks assessed/performed              Past Medical History:  Diagnosis Date   Alcohol abuse    last use 03/09/21, marijuana last 03/09/21   Allergy    Anxiety    Cataract 06/09/2012   Right eye and left eye   COPD (chronic obstructive pulmonary disease) (HCC)    Depression    Dyspnea    uses oxygen 2L via [place] prn   GERD (gastroesophageal reflux disease)    Headache    Hypertension    Long term (current) use of anticoagulants    Neuromuscular disorder ([place])    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   Seizure ([place])    08/2020 per patient   Seizures ([place])    xanax withdrawl- December 2013   Urinary incontinence 06/09/2012   Past Surgical History:  Procedure Laterality Date   APPENDECTOMY     BIOPSY  01/04/2021   Procedure: BIOPSY;  Surgeon: Yetta Flock, MD;  Location: [place];  Service: Gastroenterology;;   BIOPSY  03/12/2021   Procedure: BIOPSY;  Surgeon: Irving Copas., MD;  Location: [place];  Service: Gastroenterology;;   CATARACT EXTRACTION  06/09/2012   Left eye   COLONOSCOPY WITH PROPOFOL N/A 03/12/2021   Procedure: COLONOSCOPY WITH PROPOFOL;  Surgeon: Irving Copas., MD;  Location: [place];  Service: Gastroenterology;  Laterality: N/A;   ESOPHAGOGASTRODUODENOSCOPY (EGD) WITH PROPOFOL N/A 01/04/2021   Procedure: ESOPHAGOGASTRODUODENOSCOPY (EGD) WITH PROPOFOL;  Surgeon: Yetta Flock, MD;  Location: [place];  Service: Gastroenterology;  Laterality: N/A;   left shoulder dislocation  Sept 2011   POLYPECTOMY  03/12/2021   Procedure: POLYPECTOMY;  Surgeon: Mansouraty, Telford Nab., MD;  Location: [facility] ENDOSCOPY;  Service: Gastroenterology;;   Patient Active Problem List   Diagnosis Date Noted   Vitamin D deficiency 07/03/2021   Hypocalcemia 06/21/2021   Peripheral neuropathy    Seborrheic keratoses 05/29/2021   Hepatic steatosis 01/05/2021   Hypokalemia 01/02/2021   Chronic obstructive pulmonary disease, group B, by Global Initiative for Chronic Obstructive Lung Disease 2017 classification ([place]) 12/09/2020   Chronic pain of breast 11/12/2020   Compression fracture of body of thoracic vertebra ([place]) 11/06/2020   Drug withdrawal seizure with complication ([place]) 70/17/7939   SIADH (syndrome of inappropriate ADH production) ([place]) 10/12/2020   Hypomagnesemia    Protein-calorie malnutrition ([place]) 09/17/2020   Hyponatremia    Paroxysmal atrial fibrillation (H[facility])    Shortness of breath    Alcohol withdrawal ([place]) 09/10/2020   Pulmonary nodules/lesions, multiple 07/14/2020   HTN (hypertension) 06/02/2018   Body mass index (BMI) 31.0-31.9, adult 03/29/2013   Diarrhea 10/20/2012   Anxiety disorder 06/12/2012   Alcohol use 05/28/2012   Benzodiazepine withdrawal without complication ([place]) 03/00/9233   Tobacco abuse 02/17/2012   GERD (gastroesophageal reflux disease) 02/04/2012  Depression 02/04/2012    REFERRING PROVIDER: [name], MD   REFERRING DIAG: Compression fracture of body of thoracic vertebra   THERAPY DIAG:  Pain in thoracic spine  Chronic bilateral low back pain, unspecified whether sciatica present  Abnormal posture  Muscle weakness (generalized)  Other abnormalities of gait and mobility  ONSET DATE: approximately 5-6 months ago  PERTINENT HISTORY: Anxiety/depression, seizures, previous left shoulder injury limiting motion, thoracic  compression fracture, osteoporosis  PRECAUTIONS: Fall  WEIGHT BEARING RESTRICTIONS: No   SUBJECTIVE: Patient arrived reporting increased foot pain this visit due to neuropathy, denies any back pain at the moment. She also notes feeling more lightheaded today.   PAIN:  Are you having pain? Yes NPRS scale: 8/10 Pain location: Feet Pain orientation: Bilateral PAIN TYPE: Chronic Pain description: Burning Aggravating factors: None reported Relieving factors: None reported  PATIENT GOALS: Pain relief   OBJECTIVE: (BOLDED MEASURES ASSESSED THIS VISIT) PATIENT SURVEYS:  FOTO 40% functional status   POSTURE:  Significant increase in thoracic kyphosis, rounded shoulder, forward head   UE AROM:   Left shoulder AROM limited due to previous injury   MMT Right 06/29/2021 Left 06/29/2021  Shoulder flexion 140 100  Shoulder extension 45 30  Shoulder abduction 120 90  Functional IR L5 Sacrum  Functional ER C7 Ear      LE MMT:   MMT Right 06/29/2021 Left 06/29/2021  Hip flexion 4- 4-  Hip extension 3 3  Hip abduction 3 3  Knee flexion 4 4  Knee extension 4 4  Ankle dorsiflexion 4 4  Ankle plantarflexion 3- 3-  Ankle inversion 4 4  Ankle eversion 4 4    UE MMT:   MMT Right 06/29/2021 Left 06/29/2021  Shoulder flexion 4 4-  Shoulder ER 4 4-  Shoulder abduction 4 4-  Periscap musculature 4- 4-    FUNCTIONAL TESTS:  Sit to stand: patient unable to perform without BUE support on arm rests, she is not willing to try standing without UE support (assessed 07/13/21)   Patient with increased sway during romberg stance, patient reports fear of falling and balance impairment (assessed 07/13/21)   GAIT: Assistive device utilized: Rolling walking Level of assistance: Modidied Independence Comments: Patient with much more stable gait while using RW, she does require cueing to avoid downward gaze and for posture, remaining close to walker to avoid forward lean    TODAY'S TREATMENT    07/20/2021: NuStep L5 x 5 min with UE/LE while taking subjective SLR 2 x 10 Bridge 2 x 10 with 3 sec hold Hooklying clamshell with green 2 x 15 Sidelying hip abduction x 10 each LAQ with 3# 2 x 20 each Seated alternating march with 3# 2 x 20 Seated hamstring curl with green 2 x 15 each Seated row with green 2x15   07/13/2021: NuStep L5 x 5 min with UE/LE while taking subjective LAQ with 3# 2 x 20 each Seated alternating march with 3# 2 x 20 Seated heel toe raises with 3# 2 x 20 LTR 5 x 5 sec each Bridge 2 x 15 SLR 2 x 10 Hooklying clamshell with red 2 x 15 Seated row with red 2 x 20 Sit to stand 2 x 5 - using BUE support to stand, then lowering without UE support and focusing on slowly lowering to the chair with control Romberg stance at counter 4 x 30 sec - patient requires frequent UE support due to fear of falling   06/29/2021 (eval): Supine shoulder flexion AAROM  with dowel x 10 Supine shoulder horizontal abduction with yellow x 10 Hooklying clamshell with yellow x 10 SLR x 10 Bridge x 10 Seated scapular retraction x 10 Seated heel toe raise x 10     PATIENT EDUCATION:  Education details: HEP, continuing to use AD for mobility Person educated: Patient Education method: Explanation, Demonstration, Tactile cues, Verbal cues Education comprehension: verbalized understanding, returned demonstration, verbal cues required, tactile cues required, and needs further education   HOME EXERCISE PROGRAM: Access Code: [email or web address]     ASSESSMENT: CLINICAL IMPRESSION: Patient with fair tolerance for therapy this visit, no adverse effects reported. Patient reporting increased pain and lightheadedness this visit so therapy focused mainly on table based supine and seated strengthening to avoid exacerbating symptoms. She did arrive using RW with much improve gait steadiness, but did require cueing to improve safety. She continues to demonstrate gross weakness of postural muscles, proxima  hip and LE muscles. No changes made to HEP this visit. Patient would benefit from continued skilled PT to progress her mobility and strength in order to maximize her functional ability.      GOALS: Goals reviewed with patient? Yes   SHORT TERM GOALS:   STG Name Target Date Goal status  1 Patient will be I with initial HEP in order to progress with therapy. Baseline: continues to require cueing for exercises 07/27/2021 ONGOING  2 PT will review FOTO with patient by 3rd visit in order to understand expected progress and outcome with therapy. Baseline: reviewed 2nd visit 07/27/2021 ACHIEVED  3 Patient will report back pain </= 3/10 in order to reduce functional limitations Baseline: patient reports improved back pain, but worsening foot pain 07/27/2021 ONGOING    LONG TERM GOALS:    LTG Name Target Date Goal status  1 Patient will be I with final HEP to maintain progress from PT. Baseline: provided at eval 08/24/2021 INITIAL  2 Patient will report >/= 54% status on FOTO to indicate improved functional ability. Baseline: 40% functional status 08/24/2021 INITIAL  3 Patient will demonstrate periscapular strength grossly 4/5 MMT to improve postural control and reduce back pain. Baseline: grossly 4-/5 MMT 08/24/2021 INITIAL  4 Patient will demonstrate left shoulder elevation >/= 120 deg to improve overhead reach and dressing. Baseline: 100 deg 08/24/2021 INITIAL      PLAN: PT FREQUENCY: 1x/week   PT DURATION: 8 weeks   PLANNED INTERVENTIONS: Therapeutic exercises, Therapeutic activity, Neuro Muscular re-education, Balance training, Gait training, Patient/Family education, Joint mobilization, Stair training, Aquatic Therapy, Dry Needling, Spinal mobilization, Cryotherapy, Moist heat, Taping, and Manual therapy   PLAN FOR NEXT SESSION: Review HEP and progress PRN, postural strengthening, balance training     Hilda Blades, PT, DPT, LAT, ATC 07/20/21  10:06 AM Phone: [phone] Fax:  [phone]

## 2021-07-21 ENCOUNTER — Ambulatory Visit: Payer: 59 | Admitting: Family Medicine

## 2021-07-23 ENCOUNTER — Ambulatory Visit: Payer: 59

## 2021-07-27 ENCOUNTER — Ambulatory Visit: Payer: 59 | Admitting: Family Medicine

## 2021-07-27 ENCOUNTER — Ambulatory Visit: Payer: 59 | Admitting: Physical Therapy

## 2021-07-27 ENCOUNTER — Encounter (HOSPITAL_COMMUNITY): Payer: Self-pay | Admitting: Family Medicine

## 2021-07-27 ENCOUNTER — Observation Stay (HOSPITAL_COMMUNITY)
Admission: EM | Admit: 2021-07-27 | Discharge: 2021-07-29 | Disposition: A | Discharge status: Home / Self Care | Payer: 59 | Attending: Family Medicine | Admitting: Family Medicine

## 2021-07-27 ENCOUNTER — Other Ambulatory Visit: Payer: Self-pay

## 2021-07-27 DIAGNOSIS — G6289 Other specified polyneuropathies: Secondary | ICD-10-CM

## 2021-07-27 DIAGNOSIS — E860 Dehydration: Secondary | ICD-10-CM

## 2021-07-27 DIAGNOSIS — R110 Nausea: Secondary | ICD-10-CM

## 2021-07-27 DIAGNOSIS — R112 Nausea with vomiting, unspecified: Secondary | ICD-10-CM | POA: Insufficient documentation

## 2021-07-27 DIAGNOSIS — R000 Tachycardia, unspecified: Secondary | ICD-10-CM | POA: Diagnosis not present

## 2021-07-27 DIAGNOSIS — R197 Diarrhea, unspecified: Secondary | ICD-10-CM | POA: Diagnosis not present

## 2021-07-27 DIAGNOSIS — Z79899 Other long term (current) drug therapy: Secondary | ICD-10-CM | POA: Insufficient documentation

## 2021-07-27 DIAGNOSIS — Z20822 Contact with and (suspected) exposure to covid-19: Secondary | ICD-10-CM | POA: Diagnosis not present

## 2021-07-27 DIAGNOSIS — Z743 Need for continuous supervision: Secondary | ICD-10-CM | POA: Diagnosis not present

## 2021-07-27 DIAGNOSIS — R5383 Other fatigue: Secondary | ICD-10-CM | POA: Diagnosis not present

## 2021-07-27 DIAGNOSIS — R1110 Vomiting, unspecified: Secondary | ICD-10-CM

## 2021-07-27 DIAGNOSIS — E43 Unspecified severe protein-calorie malnutrition: Secondary | ICD-10-CM | POA: Insufficient documentation

## 2021-07-27 DIAGNOSIS — E876 Hypokalemia: Secondary | ICD-10-CM | POA: Insufficient documentation

## 2021-07-27 DIAGNOSIS — E871 Hypo-osmolality and hyponatremia: Secondary | ICD-10-CM | POA: Diagnosis present

## 2021-07-27 DIAGNOSIS — F418 Other specified anxiety disorders: Secondary | ICD-10-CM | POA: Diagnosis present

## 2021-07-27 DIAGNOSIS — R5381 Other malaise: Secondary | ICD-10-CM | POA: Diagnosis not present

## 2021-07-27 DIAGNOSIS — F32A Depression, unspecified: Secondary | ICD-10-CM | POA: Diagnosis present

## 2021-07-27 LAB — CBC WITH DIFFERENTIAL/PLATELET
Abs Immature Granulocytes: 0.01 10*3/uL (ref 0.00–0.07)
Basophils Absolute: 0 10*3/uL (ref 0.0–0.1)
Basophils Relative: 1 %
Eosinophils Absolute: 0.1 10*3/uL (ref 0.0–0.5)
Eosinophils Relative: 1 %
HCT: 43.1 % (ref 36.0–46.0)
Hemoglobin: 14.7 g/dL (ref 12.0–15.0)
Immature Granulocytes: 0 %
Lymphocytes Relative: 32 %
Lymphs Abs: 2 10*3/uL (ref 0.7–4.0)
MCH: 30.8 pg (ref 26.0–34.0)
MCHC: 34.1 g/dL (ref 30.0–36.0)
MCV: 90.4 fL (ref 80.0–100.0)
Monocytes Absolute: 0.6 10*3/uL (ref 0.1–1.0)
Monocytes Relative: 10 %
Neutro Abs: 3.6 10*3/uL (ref 1.7–7.7)
Neutrophils Relative %: 56 %
Platelets: 255 10*3/uL (ref 150–400)
RBC: 4.77 MIL/uL (ref 3.87–5.11)
RDW: 14.7 % (ref 11.5–15.5)
WBC: 6.3 10*3/uL (ref 4.0–10.5)
nRBC: 0 % (ref 0.0–0.2)

## 2021-07-27 LAB — LIPASE, BLOOD: Lipase: 27 U/L (ref 11–51)

## 2021-07-27 LAB — COMPREHENSIVE METABOLIC PANEL
ALT: 56 U/L — ABNORMAL HIGH (ref 0–44)
AST: 44 U/L — ABNORMAL HIGH (ref 15–41)
Albumin: 2.7 g/dL — ABNORMAL LOW (ref 3.5–5.0)
Alkaline Phosphatase: 115 U/L (ref 38–126)
Anion gap: 12 (ref 5–15)
BUN: 6 mg/dL — ABNORMAL LOW (ref 8–23)
CO2: 22 mmol/L (ref 22–32)
Calcium: 8.4 mg/dL — ABNORMAL LOW (ref 8.9–10.3)
Chloride: 98 mmol/L (ref 98–111)
Creatinine, Ser: 0.8 mg/dL (ref 0.44–1.00)
GFR, Estimated: >60 mL/min (ref >60)
Glucose, Bld: 86 mg/dL (ref 70–99)
Potassium: 3.6 mmol/L (ref 3.5–5.1)
Sodium: 132 mmol/L — ABNORMAL LOW (ref 135–145)
Total Bilirubin: 1.3 mg/dL — ABNORMAL HIGH (ref 0.3–1.2)
Total Protein: 5.7 g/dL — ABNORMAL LOW (ref 6.5–8.1)

## 2021-07-27 LAB — URINALYSIS, ROUTINE W REFLEX MICROSCOPIC
Bilirubin Urine: NEGATIVE
Glucose, UA: NEGATIVE mg/dL
Hgb urine dipstick: NEGATIVE
Ketones, ur: 5 mg/dL — AB
Nitrite: NEGATIVE
Protein, ur: NEGATIVE mg/dL
Specific Gravity, Urine: 1.008 (ref 1.005–1.030)
pH: 7 (ref 5.0–8.0)

## 2021-07-27 LAB — MAGNESIUM
Magnesium: 1.1 mg/dL — ABNORMAL LOW (ref 1.7–2.4)
Magnesium: 1.7 mg/dL (ref 1.7–2.4)

## 2021-07-27 MED ORDER — ONDANSETRON HCL 4 MG/2ML IJ SOLN
4.0000 mg | Freq: Once | INTRAMUSCULAR | Status: AC
  Administered 2021-07-27: 4 mg via INTRAVENOUS
  Filled 2021-07-27: qty 2

## 2021-07-27 MED ORDER — AMLODIPINE BESYLATE 5 MG PO TABS
5.0000 mg | ORAL_TABLET | Freq: Every day | ORAL | Status: DC
  Administered 2021-07-27 – 2021-07-29 (×3): 5 mg via ORAL
  Filled 2021-07-27 (×3): qty 1

## 2021-07-27 MED ORDER — ENOXAPARIN SODIUM 40 MG/0.4ML IJ SOSY
40.0000 mg | PREFILLED_SYRINGE | INTRAMUSCULAR | Status: DC
  Administered 2021-07-27 – 2021-07-28 (×2): 40 mg via SUBCUTANEOUS
  Filled 2021-07-27 (×2): qty 0.4

## 2021-07-27 MED ORDER — MAGNESIUM SULFATE 2 GM/50ML IV SOLN
2.0000 g | Freq: Once | INTRAVENOUS | Status: AC
  Administered 2021-07-27: 2 g via INTRAVENOUS
  Filled 2021-07-27: qty 50

## 2021-07-27 MED ORDER — QUETIAPINE FUMARATE 50 MG PO TABS
100.0000 mg | ORAL_TABLET | Freq: Every day | ORAL | Status: DC
  Administered 2021-07-27: 100 mg via ORAL
  Filled 2021-07-27 (×2): qty 2

## 2021-07-27 MED ORDER — CLONAZEPAM 0.5 MG PO TABS
0.5000 mg | ORAL_TABLET | Freq: Two times a day (BID) | ORAL | Status: DC
  Administered 2021-07-27 – 2021-07-29 (×4): 0.5 mg via ORAL
  Filled 2021-07-27 (×4): qty 1

## 2021-07-27 MED ORDER — MAGNESIUM SULFATE 4 GM/100ML IV SOLN: 4.0000 g | Freq: Once | INTRAVENOUS | Status: DC

## 2021-07-27 MED ORDER — ALUM & MAG HYDROXIDE-SIMETH 200-200-20 MG/5ML PO SUSP
30.0000 mL | Freq: Once | ORAL | Status: AC
Start: 2021-07-27 — End: 2021-07-27
  Administered 2021-07-27: 30 mL via ORAL
  Filled 2021-07-27: qty 30

## 2021-07-27 MED ORDER — SODIUM CHLORIDE 0.9 % IV SOLN: INTRAVENOUS | Status: DC

## 2021-07-27 MED ORDER — SODIUM CHLORIDE 0.9 % IV BOLUS
1000.0000 mL | Freq: Once | INTRAVENOUS | Status: AC
  Administered 2021-07-27: 1000 mL via INTRAVENOUS

## 2021-07-27 MED ORDER — HYDROXYZINE PAMOATE 25 MG PO CAPS
25.0000 mg | ORAL_CAPSULE | Freq: Four times a day (QID) | ORAL | Status: DC | PRN
  Filled 2021-07-27: qty 1

## 2021-07-27 MED ORDER — HYDROXYZINE HCL 25 MG PO TABS
25.0000 mg | ORAL_TABLET | Freq: Four times a day (QID) | ORAL | Status: DC | PRN
  Administered 2021-07-27: 25 mg via ORAL
  Filled 2021-07-27: qty 1

## 2021-07-27 MED ORDER — GABAPENTIN 300 MG PO CAPS
600.0000 mg | ORAL_CAPSULE | Freq: Three times a day (TID) | ORAL | Status: DC
  Administered 2021-07-27 – 2021-07-29 (×6): 600 mg via ORAL
  Filled 2021-07-27 (×6): qty 2

## 2021-07-27 MED ORDER — PROCHLORPERAZINE EDISYLATE 10 MG/2ML IJ SOLN
10.0000 mg | Freq: Four times a day (QID) | INTRAMUSCULAR | Status: DC | PRN
  Administered 2021-07-27: 10 mg via INTRAVENOUS
  Filled 2021-07-27: qty 2

## 2021-07-27 MED ORDER — ALBUTEROL SULFATE (2.5 MG/3ML) 0.083% IN NEBU: 3.0000 mL | INHALATION_SOLUTION | RESPIRATORY_TRACT | Status: DC | PRN

## 2021-07-27 MED ORDER — UMECLIDINIUM-VILANTEROL 62.5-25 MCG/ACT IN AEPB
1.0000 | INHALATION_SPRAY | Freq: Every day | RESPIRATORY_TRACT | Status: DC
  Administered 2021-07-29: 1 via RESPIRATORY_TRACT
  Filled 2021-07-27: qty 14

## 2021-07-27 MED ORDER — LIDOCAINE VISCOUS HCL 2 % MT SOLN
15.0000 mL | Freq: Once | OROMUCOSAL | Status: AC
  Administered 2021-07-27: 15 mL via ORAL
  Filled 2021-07-27: qty 15

## 2021-07-27 NOTE — ED Triage Notes (Signed)
Pt BIB EMS c/o N/V since Thursday, intermittent diarrhea and BLE neuropathy. VSS, 160/100, HR 107, 97% on RA, 104CBG.

## 2021-07-27 NOTE — Progress Notes (Incomplete)
° ° °  SUBJECTIVE:   CHIEF COMPLAINT / HPI:   Neuropathy-  PERTINENT  PMH / PSH: Hypertension, paroxysmal A-fib, COPD, GERD, SIADH, alcohol use disorder, chronic benzodiazepine use for anxiety  OBJECTIVE:   There were no vitals taken for this visit.  General: A&O, NAD HEENT: No sign of trauma, EOM grossly intact Cardiac: RRR, no m/r/g Respiratory: CTAB, normal WOB, no w/c/r GI: Soft, NTTP, non-distended  Extremities: NTTP, no peripheral edema. Neuro: Normal gait, moves all four extremities appropriately. Psych: Appropriate mood and affect   ASSESSMENT/PLAN:   No problem-specific Assessment & Plan notes found for this encounter.     [name], MD [place]

## 2021-07-27 NOTE — Therapy (Incomplete)
OUTPATIENT PHYSICAL THERAPY TREATMENT NOTE   Patient Name: Karen Casey MRN: [id number] DOB:[birth year], 66 y.o., female Today's Date: 07/27/2021  PCP: [name], MD REFERRING PROVIDER: [name], MD      Past Medical History:  Diagnosis Date   Alcohol abuse    last use 03/09/21, marijuana last 03/09/21   Allergy    Anxiety    Cataract 06/09/2012   Right eye and left eye   COPD (chronic obstructive pulmonary disease) (HCC)    Depression    Dyspnea    uses oxygen 2L via [place] prn   GERD (gastroesophageal reflux disease)    Headache    Hypertension    Long term (current) use of anticoagulants    Neuromuscular disorder ([place])    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   Seizure ([place])    08/2020 per patient   Seizures ([place])    xanax withdrawl- December 2013   Urinary incontinence 06/09/2012   Past Surgical History:  Procedure Laterality Date   APPENDECTOMY     BIOPSY  01/04/2021   Procedure: BIOPSY;  Surgeon: Yetta Flock, MD;  Location: [place];  Service: Gastroenterology;;   BIOPSY  03/12/2021   Procedure: BIOPSY;  Surgeon: Irving Copas., MD;  Location: [place];  Service: Gastroenterology;;   CATARACT EXTRACTION  06/09/2012   Left eye   COLONOSCOPY WITH PROPOFOL N/A 03/12/2021   Procedure: COLONOSCOPY WITH PROPOFOL;  Surgeon: Irving Copas., MD;  Location: [place];  Service: Gastroenterology;  Laterality: N/A;   ESOPHAGOGASTRODUODENOSCOPY (EGD) WITH PROPOFOL N/A 01/04/2021   Procedure: ESOPHAGOGASTRODUODENOSCOPY (EGD) WITH PROPOFOL;  Surgeon: Yetta Flock, MD;  Location: [place];  Service: Gastroenterology;  Laterality: N/A;   left shoulder dislocation  Sept 2011   POLYPECTOMY  03/12/2021   Procedure: POLYPECTOMY;  Surgeon: Mansouraty, Telford Nab., MD;  Location: [facility] ENDOSCOPY;  Service: Gastroenterology;;   Patient Active Problem List   Diagnosis Date Noted   Vitamin D deficiency  07/03/2021   Hypocalcemia 06/21/2021   Peripheral neuropathy    Seborrheic keratoses 05/29/2021   Hepatic steatosis 01/05/2021   Hypokalemia 01/02/2021   Chronic obstructive pulmonary disease, group B, by Global Initiative for Chronic Obstructive Lung Disease 2017 classification ([place]) 12/09/2020   Chronic pain of breast 11/12/2020   Compression fracture of body of thoracic vertebra ([place]) 11/06/2020   Drug withdrawal seizure with complication ([place]) 57/84/6962   SIADH (syndrome of inappropriate ADH production) ([place]) 10/12/2020   Hypomagnesemia    Protein-calorie malnutrition ([place]) 09/17/2020   Hyponatremia    Paroxysmal atrial fibrillation (HCC)    Shortness of breath    Alcohol withdrawal ([place]) 09/10/2020   Pulmonary nodules/lesions, multiple 07/14/2020   HTN (hypertension) 06/02/2018   Body mass index (BMI) 31.0-31.9, adult 03/29/2013   Diarrhea 10/20/2012   Anxiety disorder 06/12/2012   Alcohol use 05/28/2012   Benzodiazepine withdrawal without complication ([place]) 95/28/4132   Tobacco abuse 02/17/2012   GERD (gastroesophageal reflux disease) 02/04/2012   Depression 02/04/2012    REFERRING PROVIDER: [name], MD   REFERRING DIAG: Compression fracture of body of thoracic vertebra   THERAPY DIAG:  No diagnosis found.  ONSET DATE: approximately 5-6 months ago  PERTINENT HISTORY: Anxiety/depression, seizures, previous left shoulder injury limiting motion, thoracic compression fracture, osteoporosis  PRECAUTIONS: Fall  WEIGHT BEARING RESTRICTIONS: No   SUBJECTIVE: Patient arrived reporting increased foot pain this visit due to neuropathy, denies any back pain at the moment. She also notes feeling more  lightheaded today.   PAIN:  Are you having pain? Yes NPRS scale: 8/10 Pain location: Feet Pain orientation: Bilateral PAIN TYPE: Chronic Pain description: Burning Aggravating factors: None reported Relieving factors: None reported  PATIENT GOALS: Pain  relief   OBJECTIVE: (BOLDED MEASURES ASSESSED THIS VISIT) PATIENT SURVEYS:  FOTO 40% functional status   POSTURE:  Significant increase in thoracic kyphosis, rounded shoulder, forward head   UE AROM:   Left shoulder AROM limited due to previous injury   MMT Right 06/29/2021 Left 06/29/2021  Shoulder flexion 140 100  Shoulder extension 45 30  Shoulder abduction 120 90  Functional IR L5 Sacrum  Functional ER C7 Ear      LE MMT:   MMT Right 06/29/2021 Left 06/29/2021  Hip flexion 4- 4-  Hip extension 3 3  Hip abduction 3 3  Knee flexion 4 4  Knee extension 4 4  Ankle dorsiflexion 4 4  Ankle plantarflexion 3- 3-  Ankle inversion 4 4  Ankle eversion 4 4    UE MMT:   MMT Right 06/29/2021 Left 06/29/2021  Shoulder flexion 4 4-  Shoulder ER 4 4-  Shoulder abduction 4 4-  Periscap musculature 4- 4-    FUNCTIONAL TESTS:  Sit to stand: patient unable to perform without BUE support on arm rests, she is not willing to try standing without UE support (assessed 07/13/21)   Patient with increased sway during romberg stance, patient reports fear of falling and balance impairment (assessed 07/13/21)   GAIT: Assistive device utilized: Rolling walking Level of assistance: Modidied Independence Comments: Patient with much more stable gait while using RW, she does require cueing to avoid downward gaze and for posture, remaining close to walker to avoid forward lean    TODAY'S TREATMENT   07/27/2021: ***    07/20/2021: NuStep L5 x 5 min with UE/LE while taking subjective SLR 2 x 10 Bridge 2 x 10 with 3 sec hold Hooklying clamshell with green 2 x 15 Sidelying hip abduction x 10 each LAQ with 3# 2 x 20 each Seated alternating march with 3# 2 x 20 Seated hamstring curl with green 2 x 15 each Seated row with green 2x15  07/13/2021: NuStep L5 x 5 min with UE/LE while taking subjective LAQ with 3# 2 x 20 each Seated alternating march with 3# 2 x 20 Seated heel toe raises with  3# 2 x 20 LTR 5 x 5 sec each Bridge 2 x 15 SLR 2 x 10 Hooklying clamshell with red 2 x 15 Seated row with red 2 x 20 Sit to stand 2 x 5 - using BUE support to stand, then lowering without UE support and focusing on slowly lowering to the chair with control Romberg stance at counter 4 x 30 sec - patient requires frequent UE support due to fear of falling   PATIENT EDUCATION:  Education details: HEP, continuing to use AD for mobility Person educated: Patient Education method: Explanation, Demonstration, Tactile cues, Verbal cues Education comprehension: verbalized understanding, returned demonstration, verbal cues required, tactile cues required, and needs further education   HOME EXERCISE PROGRAM: Access Code: [email or web address]     ASSESSMENT: CLINICAL IMPRESSION: Patient with fair tolerance for therapy this visit, no adverse effects reported. Patient reporting increased pain and lightheadedness this visit so therapy focused mainly on table based supine and seated strengthening to avoid exacerbating symptoms. She did arrive using RW with much improve gait steadiness, but did require cueing to improve safety. She continues to demonstrate gross weakness of  postural muscles, proxima hip and LE muscles. No changes made to HEP this visit. Patient would benefit from continued skilled PT to progress her mobility and strength in order to maximize her functional ability.      GOALS: Goals reviewed with patient? Yes   SHORT TERM GOALS:   STG Name Target Date Goal status  1 Patient will be I with initial HEP in order to progress with therapy. Baseline: continues to require cueing for exercises 07/27/2021 ONGOING  2 PT will review FOTO with patient by 3rd visit in order to understand expected progress and outcome with therapy. Baseline: reviewed 2nd visit 07/27/2021 ACHIEVED  3 Patient will report back pain </= 3/10 in order to reduce functional limitations Baseline: patient reports improved back pain,  but worsening foot pain 07/27/2021 ONGOING    LONG TERM GOALS:    LTG Name Target Date Goal status  1 Patient will be I with final HEP to maintain progress from PT. Baseline: provided at eval 08/24/2021 INITIAL  2 Patient will report >/= 54% status on FOTO to indicate improved functional ability. Baseline: 40% functional status 08/24/2021 INITIAL  3 Patient will demonstrate periscapular strength grossly 4/5 MMT to improve postural control and reduce back pain. Baseline: grossly 4-/5 MMT 08/24/2021 INITIAL  4 Patient will demonstrate left shoulder elevation >/= 120 deg to improve overhead reach and dressing. Baseline: 100 deg 08/24/2021 INITIAL      PLAN: PT FREQUENCY: 1x/week   PT DURATION: 8 weeks   PLANNED INTERVENTIONS: Therapeutic exercises, Therapeutic activity, Neuro Muscular re-education, Balance training, Gait training, Patient/Family education, Joint mobilization, Stair training, Aquatic Therapy, Dry Needling, Spinal mobilization, Cryotherapy, Moist heat, Taping, and Manual therapy   PLAN FOR NEXT SESSION: Review HEP and progress PRN, postural strengthening, balance training     Hilda Blades, PT, DPT, LAT, ATC 07/27/21  8:12 AM Phone: [phone] Fax: [phone]

## 2021-07-27 NOTE — Progress Notes (Unsigned)
° ° °  SUBJECTIVE:   CHIEF COMPLAINT / HPI:   ***  PERTINENT  PMH / PSH: ***  OBJECTIVE:   There were no vitals taken for this visit.  ***  ASSESSMENT/PLAN:   No problem-specific Assessment & Plan notes found for this encounter.     Monseratt Ledin E Lucita Montoya, MD [facility]  

## 2021-07-27 NOTE — H&P (Addendum)
[facility] Admission History and Physical Service Pager: [phone]  Patient name: Karen Casey Medical record number: [id number] Date of birth: 12-15-1955 Age: 66 y.o. Gender: female  Primary Care Provider: [name], MD Consultants: None  Code Status: FULL  Preferred Emergency Contact:  Contact Information     Name Relation Home Work Mobile   Pilgrim Sister [place] other [place]ard Daughter [phone]  [phone]      Chief Complaint: Dehydration, neuropathy, nausea, diarrhea   Assessment and Plan: Karen Casey is a 66 y.o. female presenting with dehydration, nausea, and diarrhea. 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   Dehydration   Nausea, Vomiting, Diarrhea   Chronic Abdominal Pain  Patient presented to the ED BIB via EMS with onset of nausea and vomiting as well as foot pain/neuropathy. Labwork significant for: Hypomagnesemia to 1.1, Na 132 (chronic hyponatremia), BUN 6, elevated LFTs with AST 44, ALT 56, Ca corrected to 9.4, CBC unremarkable without leukocytosis. EKG shows sinus tachycardia with qtc of 478. Given history, the patient may be experiencing symptoms of gastroenteritis. UA unremarkable, unlikely related to UTI as she is asymptomatic. No signs of acute abdomen, unlikely appendicitis or SBO. CT abd/pelvis in 05/2021 showed some fluid scattered in nondistended small bowel and colon which may be consistent with enteritis. She feels the abdominal pain is similar to her chronic symptoms. Will continue with symptomatic management unless symptoms worsen. Consider updated CT scan of abdomen/pelvis.   In the ED she received, a GI cocktail (Maalox/Mylanta and Lidocaine mouth  solution), zofran, Mag sulfate and a 1L NS bolus.  -- Admit to FPTS (med tele), attending Dr. Owens Shark  -- VS per protocol  -- Compazine for nausea  -- mIVF 125mL/hr NS -- s/p bolus 1L  -- AM CMP, CBC, mag -- Compazine 10 mg q6PRN  -- cardiac monitoring  -- SCDs/Lovenox  -- Consider updated CT scan  -- Abdominal exams regularly   Hypomagnesemia   Weakness, Fatigue, Neuropathy  Causes for low magnesium could include PPI use, malnutrition, diarrhea, malabsorption, pancreatitis. Given patient's history, likely related to malnutrition, diarrhea. Will recheck mag after ED gave 2 g total at 8PM today. Neuropathy in the feet could very well be associated to her electrolyte abnormality, so we will monitor these symptoms with correction of her magnesium. No signs of ulcerations on feet, pulses intact.  -Recheck mag and replete accordingly  -Mag >2 -EKGs as appropriate   Chronic Hyponatremia   SIADH Sodium today 132, chronically hyponatremic, and this appears to be her baseline  -AM BMP   Elevated LFTs AST 44, ALT 56 with benign abdominal exam.  -Monitor CMP in AM   Paroxysmal atrial fib EKG on admission showed sinus tachycardia, in NSR now. Rate today range from 90s-100s.  -Monitor HR  HTN Home medication of Amlodipine 5 mg. BP today: 120s-160s/70s-100s. -Continue home medication dosage  Depression   Anxiety   H/o benzodiazepine withdrawal seizures  Patient appears very anxious in room and would like her medications. Home medications: Clonazepam 0.5 mg TID PRN (PCP Dr. Thompson Grayer wanted to taper at next visit to BID), Hydroxyzine 25 mg 4xdaily as needed, Seroquel 100 mg nightly. Pt is amenable to decreasing Klonopin dosage to BID.  -Klonopin BID 0.5 mg  -Atarax,  Seroquel home dosage (Atarax scheduled per patient report)  Chronic Hypoxemic Respiratory Failure  Patient has home oxygen requirement of 2L. Currently doing well on RA.  -Monitor respiratory status  -Continue O2 saturation  monitoring  GERD  Patient takes Nexium 40 mg daily.  -Continue Pantoprazole 40 mg   Tobacco Use Disorder  Patient smokes 10 cigarettes a day.  -Continue with Nicotine Patch  -encourage cessation outpatient   Alcohol Use Disorder Patient reports that she has cut down significantly on her alcohol use. Drinks four beers a week.  -CIWAs  FEN/GI: Regular  Prophylaxis: Lovenox   Disposition: Med-tele   History of Present Illness:  Karen Casey is a 66 y.o. female with PMHx anxiety, h/o alcohol use disorder and withdrawal seizures from benzodiazepines, h/o of thoracic compression fracture, osteoporosis, HTN, vitamin D deficiency, COPD gold stage B, PAF no longer on AC, h/o SIADH. She is presenting with nausea, vomiting and diarrhea as well as neuropathy in the feet. Started to have nausea, vomiting and diarrhea on Thursday, 2/9. She has not vomited in 2 days but reports that she has had diarrhea that started a couple of days ago. Denies any episodes of diarrhea since she took an OTC medication for it (unsure which medication). She feels that she does not need an abdominal CT scan as she has chronic abdominal pain that feels just like the current pain. She has also been feeling weak, says this has been ongoing for almost a year where she feels weak. She feels that her neuropathy is really bothering her. Denies any fainting or LOC. Today, she has taken her gabapentin and one dose of klonopin, she needs all her other medications. Denies chest pain, shortness of breath, cough, fever, chills and confusion. Started having headaches with photophobia that started a year ago intermittently. She has noticed swelling in her legs with her neuropathy. Headaches appear with the nausea and vomiting. Denies any sick contacts. Uses 2L at home but does not always use them. Lives with spouse. Smokes about 10 cigarettes a day for several years, since she was young. She drinks about 4 beers a week, used to consume  alcohol heavily. Admits to marijuana use, used about a week ago. Has continued use since she was young.   Review Of Systems: Per HPI with the following additions:   Review of Systems  Constitutional:  Positive for fatigue. Negative for fever.  HENT:  Negative for congestion, postnasal drip, sinus pain and sore throat.   Respiratory:  Negative for cough and shortness of breath.   Cardiovascular:  Negative for chest pain and leg swelling.  Gastrointestinal:  Positive for abdominal pain, diarrhea, nausea and vomiting. Negative for abdominal distention, anal bleeding and blood in stool.  Genitourinary:  Negative for dysuria, frequency and urgency.  Musculoskeletal:        Neuropathy bilateral feet  Neurological:  Positive for headaches.  Psychiatric/Behavioral:  The patient is nervous/anxious.     Patient Active Problem List   Diagnosis Date Noted   Vitamin D deficiency 07/03/2021   Hypocalcemia 06/21/2021   Peripheral neuropathy    Seborrheic keratoses 05/29/2021   Hepatic steatosis 01/05/2021   Hypokalemia 01/02/2021   Chronic obstructive pulmonary disease, group B, by Global Initiative for Chronic Obstructive Lung Disease 2017 classification ([place]) 12/09/2020   Chronic pain of breast 11/12/2020   Compression fracture of body of thoracic vertebra ([place]) 11/06/2020   Drug withdrawal seizure with complication ([place]) 44/06/270   SIADH (syndrome of inappropriate ADH production) ([place])  10/12/2020   Hypomagnesemia    Protein-calorie malnutrition ([place]) 09/17/2020   Hyponatremia    Paroxysmal atrial fibrillation (HCC)    Shortness of breath    Alcohol withdrawal ([place]) 09/10/2020   Pulmonary nodules/lesions, multiple 07/14/2020   HTN (hypertension) 06/02/2018   Body mass index (BMI) 31.0-31.9, adult 03/29/2013   Diarrhea 10/20/2012   Anxiety disorder 06/12/2012   Alcohol use 05/28/2012   Benzodiazepine withdrawal without complication ([place]) 32/35/5732   Tobacco abuse 02/17/2012   GERD  (gastroesophageal reflux disease) 02/04/2012   Depression 02/04/2012    Past Medical History: Past Medical History:  Diagnosis Date   Alcohol abuse    last use 03/09/21, marijuana last 03/09/21   Allergy    Anxiety    Cataract 06/09/2012   Right eye and left eye   COPD (chronic obstructive pulmonary disease) (HCC)    Depression    Dyspnea    uses oxygen 2L via [place] prn   GERD (gastroesophageal reflux disease)    Headache    Hypertension    Long term (current) use of anticoagulants    Neuromuscular disorder ([place])    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   Seizure ([place])    08/2020 per patient   Seizures ([place])    xanax withdrawl- December 2013   Urinary incontinence 06/09/2012    Past Surgical History: Past Surgical History:  Procedure Laterality Date   APPENDECTOMY     BIOPSY  01/04/2021   Procedure: BIOPSY;  Surgeon: Yetta Flock, MD;  Location: [place];  Service: Gastroenterology;;   BIOPSY  03/12/2021   Procedure: BIOPSY;  Surgeon: Irving Copas., MD;  [facility] ENDOSCOPY;  Service: Gastroenterology;;   CATARACT EXTRACTION  06/09/2012   Left eye   COLONOSCOPY WITH PROPOFOL N/A 03/12/2021   Procedure: COLONOSCOPY WITH PROPOFOL;  Surgeon: Irving Copas., MD;  Location: [facility] ENDOSCOPY;  Service: Gastroenterology;  Laterality: N/A;   ESOPHAGOGASTRODUODENOSCOPY (EGD) WITH PROPOFOL N/A 01/04/2021   Procedure: ESOPHAGOGASTRODUODENOSCOPY (EGD) WITH PROPOFOL;  Surgeon: Yetta Flock, MD;  Location: [place];  Service: Gastroenterology;  Laterality: N/A;   left shoulder dislocation  Sept 2011   POLYPECTOMY  03/12/2021   Procedure: POLYPECTOMY;  Surgeon: Mansouraty, Telford Nab., MD;  Location: [facility] ENDOSCOPY;  Service: Gastroenterology;;    Social History: Social History   Tobacco Use   Smoking status: Every Day    Packs/day: 0.50    Years: 43.00    Pack years: 21.50    Types: Cigarettes   Smokeless tobacco: Never    Tobacco comments:    Tobacco info given  Vaping Use   Vaping Use: Never used  Substance Use Topics   Alcohol use: Yes    Alcohol/week: 3.0 standard drinks    Types: 3 Cans of beer per week    Comment: 2-3 times week just beer   Drug use: Not Currently    Types: Marijuana, Other-see comments    Comment: Past hx of benzo abuse--xanax   Additional social history: Lives at home with her spouse and drinks about 4 beers a week.   Please also refer to relevant sections of EMR.  Family History: Family History  Problem Relation Age of Onset   Hypertension Mother    Hyperlipidemia Mother    Aneurysm Mother        Rupture - Cause of death   Heart disease Father        MI - cause of death   Depression Father  Parkinsonism Father    Hypertension Sister    Colon cancer Neg Hx    Esophageal cancer Neg Hx    Rectal cancer Neg Hx    Stomach cancer Neg Hx     Allergies and Medications: No Known Allergies No current facility-administered medications on file prior to encounter.   Current Outpatient Medications on File Prior to Encounter  Medication Sig Dispense Refill   acetaminophen (TYLENOL) 325 MG tablet Take 650 mg by mouth every 6 (six) hours as needed for mild pain or headache.     albuterol (VENTOLIN HFA) 108 (90 Base) MCG/ACT inhaler TAKE 2 PUFFS BY MOUTH EVERY 6 HOURS AS NEEDED FOR WHEEZE OR SHORTNESS OF BREATH 8.5 each 2   amLODipine (NORVASC) 5 MG tablet Take 1 tablet (5 mg total) by mouth daily. 90 tablet 1   ANORO ELLIPTA 62.5-25 MCG/ACT AEPB INHALE 1 PUFF BY MOUTH EVERY DAY (Patient taking differently: Inhale 1 puff into the lungs daily.) 60 each 1   CALCIUM-MAGNESIUM-ZINC PO Take 1 tablet by mouth daily. (Patient not taking: Reported on 07/03/2021)     Capsaicin (CAPSAICIN HP) 0.1 % CREA Apply to feet two times daily for burning neuropathy 60 g 3   clonazePAM (KLONOPIN) 0.5 MG tablet TAKE 1 TABLET (0.5 MG TOTAL) BY MOUTH 3 (THREE) TIMES DAILY AS NEEDED FOR UP TO 28 DAYS FOR  ANXIETY 84 tablet 0   Diclofenac Sodium 1 % CREA Apply 1 application topically every 12 (twelve) hours as needed. 120 g 2   dicyclomine (BENTYL) 20 MG tablet Take 1 tablet (20 mg total) by mouth 3 (three) times daily as needed for up to 30 doses for spasms. 30 tablet 0   esomeprazole (NEXIUM) 40 MG capsule TAKE 1 CAPSULE BY MOUTH EVERY DAY AT NOON (Patient taking differently: Take 40 mg by mouth daily.) 90 capsule 1   feeding supplement (ENSURE ENLIVE / ENSURE PLUS) LIQD Take 237 mLs by mouth 2 (two) times daily between meals. 237 mL 12   gabapentin (NEURONTIN) 600 MG tablet Take 1 tablet (600 mg total) by mouth 3 (three) times daily. 180 tablet 0   hydrOXYzine (VISTARIL) 25 MG capsule Take 1 capsule (25 mg total) by mouth every 6 (six) hours as needed for anxiety or itching. 270 capsule 2   Multiple Vitamins-Minerals (CENTRUM SILVER 50+WOMEN PO) Take 1 tablet by mouth daily.     nicotine (NICODERM CQ - DOSED IN MG/24 HOURS) 21 mg/24hr patch Place 1 patch (21 mg total) onto the skin daily. (Patient not taking: Reported on 06/21/2021) 28 patch 1   NON FORMULARY O2 use as needed     ondansetron (ZOFRAN-ODT) 8 MG disintegrating tablet Take 1 tablet (8 mg total) by mouth every 8 (eight) hours as needed for nausea or vomiting. 15 tablet 0   potassium chloride (KLOR-CON) 10 MEQ tablet Take 1 tablet (10 mEq total) by mouth daily for 30 doses. 30 tablet 0   promethazine (PHENERGAN) 12.5 MG suppository Place 12.5 mg rectally every 4 (four) hours as needed for nausea/vomiting.     QUEtiapine (SEROQUEL) 100 MG tablet Take 1 tablet (100 mg total) by mouth at bedtime. 90 tablet 2    Objective: BP (!) 156/102    Pulse 100    Temp 98.4 F (36.9 C) (Oral)    Resp 20    Ht 5\' 3"  (1.6 m)    Wt 75 kg    SpO2 98%    BMI 29.29 kg/m  Exam: General: Anxious and nontoxic  in appearance resting in bed with sister at bedside  Eyes: Dilated pupils, equal and reactive ENTM: nml nares and external ears  Neck: FROM without  LAD Cardiovascular: RRR, no m/r/g Respiratory: CTAB, no wheezing/stridor/rhonchi  Gastrointestinal: Epigastric tenderness, nondistended, soft, normoactive BS MSK: FROM, DP pulses palpable, tenderness to light palpation over dorsal surface of feet-out of proportion to exam, no ulcerations  Derm: Warm and dry  Neuro: No obvious focal neurologic deficits  Psych: Anxious and nervous mood but appropriately responsive   Labs and Imaging: CBC BMET  Recent Labs  Lab 07/27/21 0945  WBC 6.3  HGB 14.7  HCT 43.1  PLT 255   Recent Labs  Lab 07/27/21 0945  NA 132*  K 3.6  CL 98  CO2 22  BUN 6*  CREATININE 0.80  GLUCOSE 86  CALCIUM 8.4*     EKG: My own interpretation: Sinus tachycardia qtc 478     Erskine Emery, MD 07/27/2021, 5:29 PM PGY-1, [place] Intern pager: [phone], text pages welcome  I was personally present and performed or re-performed the history, physical exam and medical decision making activities of this service and have verified that the service and findings are accurately documented in the residents note.  Donney Dice, DO                  07/27/2021, 6:09 PM  PGY-2, [place]

## 2021-07-27 NOTE — ED Provider Notes (Signed)
Karen Casey   CSN: [id number] Arrival date & time:        History  Chief Complaint  Patient presents with   Nausea   Emesis   Peripheral Neuropathy    Karen Casey is a 66 y.o. female.  The history is provided by the patient and medical records. No language interpreter was used.  Diarrhea Quality:  Watery Severity:  Severe Onset quality:  Gradual Duration:  6 days Timing:  Constant Progression:  Improving Relieved by:  Nothing Worsened by:  Nothing Ineffective treatments:  None tried Associated symptoms: abdominal pain (chronic reporrted)   Associated symptoms: no chills, no recent cough, no diaphoresis, no fever, no headaches, no myalgias and no vomiting       Home Medications Prior to Admission medications   Medication Sig Start Date End Date Taking? Authorizing Provider  acetaminophen (TYLENOL) 325 MG tablet Take 650 mg by mouth every 6 (six) hours as needed for mild pain or headache.    [provider]  albuterol (VENTOLIN HFA) 108 (90 Base) MCG/ACT inhaler TAKE 2 PUFFS BY MOUTH EVERY 6 HOURS AS NEEDED FOR WHEEZE OR SHORTNESS OF BREATH 07/02/21   Cantwell, Celeste C, PA-C  amLODipine (NORVASC) 5 MG tablet Take 1 tablet (5 mg total) by mouth daily. 05/29/21 09/26/21  [name], MD  ANORO ELLIPTA 62.5-25 MCG/ACT AEPB INHALE 1 PUFF BY MOUTH EVERY DAY Patient taking differently: Inhale 1 puff into the lungs daily. 06/09/21   Martyn Malay, MD  CALCIUM-MAGNESIUM-ZINC PO Take 1 tablet by mouth daily. Patient not taking: Reported on 07/03/2021    [provider]  Capsaicin (CAPSAICIN HP) 0.1 % CREA Apply to feet two times daily for burning neuropathy 06/26/21   Zenia Resides, MD  clonazePAM (KLONOPIN) 0.5 MG tablet TAKE 1 TABLET (0.5 MG TOTAL) BY MOUTH 3 (THREE) TIMES DAILY AS NEEDED FOR UP TO 28 DAYS FOR ANXIETY 07/16/21   [name], MD  Diclofenac Sodium 1 % CREA Apply 1 application  topically every 12 (twelve) hours as needed. 06/29/21   Edrick Kins, DPM  dicyclomine (BENTYL) 20 MG tablet Take 1 tablet (20 mg total) by mouth 3 (three) times daily as needed for up to 30 doses for spasms. 05/22/21   Wyvonnia Dusky, MD  esomeprazole (NEXIUM) 40 MG capsule TAKE 1 CAPSULE BY MOUTH EVERY DAY AT NOON Patient taking differently: Take 40 mg by mouth daily. 03/26/21   [name], MD  feeding supplement (ENSURE ENLIVE / ENSURE PLUS) LIQD Take 237 mLs by mouth 2 (two) times daily between meals. 01/06/21   Alen Bleacher, MD  gabapentin (NEURONTIN) 600 MG tablet Take 1 tablet (600 mg total) by mouth 3 (three) times daily. 06/24/21   Autry-Lott, Naaman Plummer, DO  hydrOXYzine (VISTARIL) 25 MG capsule Take 1 capsule (25 mg total) by mouth every 6 (six) hours as needed for anxiety or itching. 06/24/21 09/22/21  [name], MD  Multiple Vitamins-Minerals (CENTRUM SILVER 50+WOMEN PO) Take 1 tablet by mouth daily.    [provider]  nicotine (NICODERM CQ - DOSED IN MG/24 HOURS) 21 mg/24hr patch Place 1 patch (21 mg total) onto the skin daily. Patient not taking: Reported on 06/21/2021 01/08/21   Zenia Resides, MD  NON FORMULARY O2 use as needed    [provider]  ondansetron (ZOFRAN-ODT) 8 MG disintegrating tablet Take 1 tablet (8 mg total) by mouth every 8 (eight) hours as needed  for nausea or vomiting. 05/22/21   [name], MD  potassium chloride (KLOR-CON) 10 MEQ tablet Take 1 tablet (10 mEq total) by mouth daily for 30 doses. 05/22/21 06/21/21  Wyvonnia Dusky, MD  promethazine (PHENERGAN) 12.5 MG suppository Place 12.5 mg rectally every 4 (four) hours as needed for nausea/vomiting. 01/26/21   [provider]  QUEtiapine (SEROQUEL) 100 MG tablet Take 1 tablet (100 mg total) by mouth at bedtime. 05/29/21 08/27/21  [name], MD      Allergies    Patient has no known allergies.    Review of Systems   Review of Systems  Constitutional:  Positive  for fatigue. Negative for chills, diaphoresis and fever.  HENT:  Negative for congestion.   Eyes:  Negative for visual disturbance.  Respiratory:  Negative for cough, chest tightness, shortness of breath and wheezing.   Cardiovascular:  Negative for chest pain and leg swelling.  Gastrointestinal:  Positive for abdominal pain (chronic reporrted), diarrhea and nausea. Negative for abdominal distention, constipation and vomiting.  Genitourinary:  Positive for decreased urine volume (darker). Negative for dysuria.  Musculoskeletal:  Negative for back pain and myalgias.  Skin:  Negative for rash and wound.  Neurological:  Negative for light-headedness and headaches.  Psychiatric/Behavioral:  Negative for agitation and confusion.   All other systems reviewed and are negative.  Physical Exam Updated Vital Signs BP (!) 139/93 (BP Location: Right Arm)    Pulse (!) 109    Temp 98.4 F (36.9 C) (Oral)    Resp (!) 21    Ht 5\' 3"  (1.6 m)    Wt 75 kg    SpO2 100%    BMI 29.29 kg/m  Physical Exam Vitals and nursing Casey reviewed.  Constitutional:      General: She is not in acute distress.    Appearance: She is well-developed. She is not ill-appearing, toxic-appearing or diaphoretic.  HENT:     Head: Normocephalic and atraumatic.     Nose: Nose normal.     Mouth/Throat:     Mouth: Mucous membranes are dry.     Pharynx: No oropharyngeal exudate or posterior oropharyngeal erythema.  Eyes:     Extraocular Movements: Extraocular movements intact.     Conjunctiva/sclera: Conjunctivae normal.     Pupils: Pupils are equal, round, and reactive to light.  Cardiovascular:     Rate and Rhythm: Regular rhythm. Tachycardia present.     Heart sounds: No murmur heard. Pulmonary:     Effort: Pulmonary effort is normal. No respiratory distress.     Breath sounds: Normal breath sounds. No wheezing, rhonchi or rales.  Chest:     Chest wall: No tenderness.  Abdominal:     General: Abdomen is flat.      Palpations: Abdomen is soft.     Tenderness: There is no abdominal tenderness. There is no guarding or rebound.  Musculoskeletal:        General: Tenderness (in legs bilaterally in feet) present. No swelling.     Cervical back: Neck supple. No tenderness.  Skin:    General: Skin is warm and dry.     Capillary Refill: Capillary refill takes less than 2 seconds.     Findings: No erythema or rash.  Neurological:     Mental Status: She is alert. Mental status is at baseline.  Psychiatric:        Mood and Affect: Mood normal.    ED Results / Procedures / Treatments   Labs (  all labs ordered are listed, but only abnormal results are displayed) Labs Reviewed  COMPREHENSIVE METABOLIC PANEL - Abnormal; Notable for the following components:      Result Value   Sodium 132 (*)    BUN 6 (*)    Calcium 8.4 (*)    Total Protein 5.7 (*)    Albumin 2.7 (*)    AST 44 (*)    ALT 56 (*)    Total Bilirubin 1.3 (*)    All other components within normal limits  URINALYSIS, ROUTINE W REFLEX MICROSCOPIC - Abnormal; Notable for the following components:   Ketones, ur 5 (*)    Leukocytes,Ua TRACE (*)    Bacteria, UA RARE (*)    All other components within normal limits  MAGNESIUM - Abnormal; Notable for the following components:   Magnesium 1.1 (*)    All other components within normal limits  URINE CULTURE  CBC WITH DIFFERENTIAL/PLATELET  LIPASE, BLOOD    EKG EKG Interpretation  Date/Time:  Monday July 27 2021 09:53:09 EST Ventricular Rate:  98 PR Interval:  119 QRS Duration: 69 QT Interval:  374 QTC Calculation: 478 R Axis:   28 Text Interpretation: Sinus tachycardia Atrial premature complexes Borderline short PR interval When compared to prior, similar appearance. No STEMI Confirmed by Antony Blackbird [phone]) on 07/27/2021 12:23:35 PM  Radiology No results found.  Procedures Procedures    Medications Ordered in ED Medications  alum & mag hydroxide-simeth (MAALOX/MYLANTA)  200-200-20 MG/5ML suspension 30 mL (30 mLs Oral Given 07/27/21 0945)    And  lidocaine (XYLOCAINE) 2 % viscous mouth solution 15 mL (15 mLs Oral Given 07/27/21 0945)  sodium chloride 0.9 % bolus 1,000 mL (1,000 mLs Intravenous New Bag/Given 07/27/21 0945)  ondansetron (ZOFRAN) injection 4 mg (4 mg Intravenous Given 07/27/21 0945)  magnesium sulfate IVPB 2 g 50 mL (2 g Intravenous New Bag/Given 07/27/21 1145)    ED Course/ Medical Decision Making/ A&P                           Medical Decision Making Amount and/or Complexity of Data Reviewed Labs: ordered.  Risk OTC drugs. Prescription drug management. Decision regarding hospitalization.   Karen Casey is a 66 y.o. female with a past medical history significant for GERD, hypertension, chronic significant peripheral neuropathies, paroxysmal atrial fibrillation, previous alcohol abuse with withdrawal, SIADH, anxiety, depression, previous hypokalemia, hypomagnesemia, and hyponatremia who presents with nausea, diarrhea, fatigue, malaise, and continued peripheral neuropathy.  According to patient, for the last 6 days ago, she has had nonbloody diarrhea waxing and waning as well as nausea and decreased oral intake.  She reports her urine is darker.  She reports he took an Imodium yesterday and the diarrhea has lessened today.  She reports she is not eating as much and feels very dehydrated and fatigued.  She says this happens to her frequently and she feels she needs some fluids and to have her electrolytes checked.  She has been taking all the supplements as directed by her previous team's and says that she has not had alcohol for around a week.  She reports that she has quit alcohol.  She reports chronic abdominal discomfort and reports this does not feel different than normal and does not necessarily want imaging done today.  She denies other fevers, chills, ingestion, cough, chest pain, shortness of breath.  She reports her extremities are hurting  similar to when her neuropathy flares up in  the past.  On exam, lungs clear and chest nontender.  Abdomen is nontender on exam.  Bowel sounds appreciated.  No rashes seen.  Patient has tender lower extremities bilaterally which she reports is due to the neuropathies.  I do not see any evidence of burns or evidence of cellulitis.  Patient is tachycardic and tachypneic but is afebrile.  Mucous membranes are dry.  EKG showed no stemi.  Clinically, I suspect she has had diarrhea and is causing dehydration.  With her previous electrolyte abnormalities, will check them and hydrate with some fluids and nausea medicine.  We will give a GI cocktail for the likely upper abdominal discomfort similar to previous GERD.  Patient agrees to hold on CT imaging at this time.  If work-up is reassuring, dissipate discharge for outpatient follow-up.   2:48 PM Please return.  The only critical finding that appears worse than prior as her magnesium is down to 1.1.  She denied urinary symptoms and there are no nitrites, doubt UTI.  We will send culture.  CBC reassuring and improved from prior.  Patient does have slight elevation in LFTs but no abdominal pain on my exam.  Creatinine normal.  Potassium normal.  I do suspect some of the patient's generalized weakness and fatigue and worsened peripheral neuropathies may be related to her hypomagnesemia.  I gave some IV magnesium the patient is still feeling bad.  She is still reporting diarrhea symptoms.  She does not feel she can go home safely as she does not think she can walk due to the generalized weakness, peripheral neuropathy worsen, and fatigue.  Patient appears to be a patient of Dr. Thompson Grayer with family medicine residency teaching team  Will call only medicine for admission for likely diarrhea related hypomagnesemia leading to generalized fatigue and weakness and peripheral neuropathy exacerbation.         Final Clinical Impression(s) / ED Diagnoses Final  diagnoses:  Hypomagnesemia  Nausea  Dehydration  Diarrhea, unspecified type  Fatigue, unspecified type    Clinical Impression: 1. Hypomagnesemia   2. Nausea   3. Dehydration   4. Diarrhea, unspecified type   5. Fatigue, unspecified type     Disposition: Admit  This Casey was prepared with assistance of Dragon voice recognition software. Occasional wrong-word or sound-a-like substitutions may have occurred due to the inherent limitations of voice recognition software.     Natacha Jepsen, Gwenyth Allegra, MD 07/27/21 [phone]

## 2021-07-27 NOTE — Plan of Care (Incomplete)
Pt admitted for hypomagnesemia. BP (!) 153/83 (BP Location: Left Arm)    Pulse (!) 103    Temp (!) 97.4 F (36.3 C) (Oral)    Resp 16    Ht 5\' 3"  (1.6 m)    Wt 75 kg    SpO2 93%    BMI 29.29 kg/m  Pt on tele NSR. Pt c/o pain in feet. CHG bath given, no skin issues noted at this time.

## 2021-07-28 ENCOUNTER — Other Ambulatory Visit (HOSPITAL_COMMUNITY): Payer: Self-pay

## 2021-07-28 DIAGNOSIS — R5383 Other fatigue: Secondary | ICD-10-CM | POA: Diagnosis not present

## 2021-07-28 DIAGNOSIS — E860 Dehydration: Secondary | ICD-10-CM | POA: Diagnosis not present

## 2021-07-28 DIAGNOSIS — R197 Diarrhea, unspecified: Secondary | ICD-10-CM | POA: Diagnosis not present

## 2021-07-28 DIAGNOSIS — E43 Unspecified severe protein-calorie malnutrition: Secondary | ICD-10-CM | POA: Insufficient documentation

## 2021-07-28 LAB — COMPREHENSIVE METABOLIC PANEL
ALT: 46 U/L — ABNORMAL HIGH (ref 0–44)
AST: 37 U/L (ref 15–41)
Albumin: 2.5 g/dL — ABNORMAL LOW (ref 3.5–5.0)
Alkaline Phosphatase: 105 U/L (ref 38–126)
Anion gap: 10 (ref 5–15)
BUN: <5 mg/dL — ABNORMAL LOW (ref 8–23)
CO2: 24 mmol/L (ref 22–32)
Calcium: 8.3 mg/dL — ABNORMAL LOW (ref 8.9–10.3)
Chloride: 103 mmol/L (ref 98–111)
Creatinine, Ser: 0.71 mg/dL (ref 0.44–1.00)
GFR, Estimated: >60 mL/min (ref >60)
Glucose, Bld: 104 mg/dL — ABNORMAL HIGH (ref 70–99)
Potassium: 3.3 mmol/L — ABNORMAL LOW (ref 3.5–5.1)
Sodium: 137 mmol/L (ref 135–145)
Total Bilirubin: 0.8 mg/dL (ref 0.3–1.2)
Total Protein: 5.1 g/dL — ABNORMAL LOW (ref 6.5–8.1)

## 2021-07-28 LAB — CBC
HCT: 38.3 % (ref 36.0–46.0)
Hemoglobin: 12.8 g/dL (ref 12.0–15.0)
MCH: 31 pg (ref 26.0–34.0)
MCHC: 33.4 g/dL (ref 30.0–36.0)
MCV: 92.7 fL (ref 80.0–100.0)
Platelets: 192 10*3/uL (ref 150–400)
RBC: 4.13 MIL/uL (ref 3.87–5.11)
RDW: 15.1 % (ref 11.5–15.5)
WBC: 5.9 10*3/uL (ref 4.0–10.5)
nRBC: 0 % (ref 0.0–0.2)

## 2021-07-28 LAB — MAGNESIUM
Magnesium: 1.8 mg/dL (ref 1.7–2.4)
Magnesium: 2.2 mg/dL (ref 1.7–2.4)

## 2021-07-28 LAB — URINE CULTURE: Culture: 40000 — AB

## 2021-07-28 LAB — HEPATITIS C ANTIBODY: HCV Ab: NONREACTIVE

## 2021-07-28 LAB — RESP PANEL BY RT-PCR (FLU A&B, COVID) ARPGX2
Influenza A by PCR: NEGATIVE
Influenza B by PCR: NEGATIVE
SARS Coronavirus 2 by RT PCR: NEGATIVE

## 2021-07-28 LAB — RPR: RPR Ser Ql: NONREACTIVE

## 2021-07-28 LAB — HEPATITIS B SURFACE ANTIGEN: Hepatitis B Surface Ag: NONREACTIVE

## 2021-07-28 MED ORDER — NICOTINE 7 MG/24HR TD PT24
7.0000 mg | MEDICATED_PATCH | Freq: Every day | TRANSDERMAL | Status: DC
  Administered 2021-07-28 – 2021-07-29 (×2): 7 mg via TRANSDERMAL
  Filled 2021-07-28 (×2): qty 1

## 2021-07-28 MED ORDER — MAGNESIUM SULFATE 2 GM/50ML IV SOLN
2.0000 g | Freq: Once | INTRAVENOUS | Status: AC
  Administered 2021-07-28: 2 g via INTRAVENOUS
  Filled 2021-07-28: qty 50

## 2021-07-28 MED ORDER — ENSURE ENLIVE PO LIQD
237.0000 mL | Freq: Two times a day (BID) | ORAL | Status: DC
  Administered 2021-07-28 – 2021-07-29 (×3): 237 mL via ORAL

## 2021-07-28 MED ORDER — ADULT MULTIVITAMIN W/MINERALS CH
1.0000 | ORAL_TABLET | Freq: Every day | ORAL | Status: DC
  Administered 2021-07-28 – 2021-07-29 (×2): 1 via ORAL
  Filled 2021-07-28 (×2): qty 1

## 2021-07-28 MED ORDER — NORTRIPTYLINE HCL 10 MG PO CAPS
10.0000 mg | ORAL_CAPSULE | Freq: Every day | ORAL | Status: DC
  Administered 2021-07-28: 10 mg via ORAL
  Filled 2021-07-28 (×2): qty 1

## 2021-07-28 MED ORDER — QUETIAPINE FUMARATE 50 MG PO TABS
75.0000 mg | ORAL_TABLET | Freq: Every day | ORAL | Status: DC
  Administered 2021-07-28: 75 mg via ORAL
  Filled 2021-07-28: qty 1

## 2021-07-28 MED ORDER — PANTOPRAZOLE SODIUM 40 MG PO TBEC
40.0000 mg | DELAYED_RELEASE_TABLET | Freq: Every day | ORAL | Status: DC
  Administered 2021-07-28 – 2021-07-29 (×2): 40 mg via ORAL
  Filled 2021-07-28 (×2): qty 1

## 2021-07-28 MED ORDER — POTASSIUM CHLORIDE CRYS ER 20 MEQ PO TBCR
40.0000 meq | EXTENDED_RELEASE_TABLET | Freq: Once | ORAL | Status: AC
  Administered 2021-07-28: 40 meq via ORAL
  Filled 2021-07-28: qty 2

## 2021-07-28 MED ORDER — RIFAXIMIN 200 MG PO TABS
200.0000 mg | ORAL_TABLET | Freq: Three times a day (TID) | ORAL | Status: DC
  Administered 2021-07-28 – 2021-07-29 (×3): 200 mg via ORAL
  Filled 2021-07-28 (×5): qty 1

## 2021-07-28 NOTE — Progress Notes (Signed)
Pt admitted for hypomagnesemia, pt given IV magnesium. Pt A&OX4, NSR no skin issues. Pt oriented to floor. BP 117/82 (BP Location: Left Arm)    Pulse 92    Temp 97.7 F (36.5 C) (Axillary)    Resp (!) 21    Ht 5\' 3"  (1.6 m)    Wt 75 kg    SpO2 94%    BMI 29.29 kg/m  Pt has 2/4 rails up, bed in lowest position, wheels locked and bed alarm on. No further needs voiced at this time.  Jackelyn Hoehn Crisanto Nied 1:14 AM 07/28/21

## 2021-07-28 NOTE — TOC Benefit Eligibility Note (Addendum)
Patient Teacher, English as a foreign language completed.    The patient is currently admitted and upon discharge could be taking Xifaxan 200 mg tablets.  Requires Prior Authorization   The patient is insured through [place], [place] Patient Advocate Specialist [place] Patient Advocate Team Direct Number: [phone]  Fax: [phone]

## 2021-07-28 NOTE — Progress Notes (Signed)
°  Transition of Care [facility]) Screening Note   Patient Details  Name: Karen Casey Date of Birth: 09/11/1955   Transition of Care [facility]) CM/SW Contact:    Benard Halsted, LCSW Phone Number: 07/28/2021, 8:32 AM    Transition of Care Department [facility]) has reviewed patient. We will continue to monitor patient advancement through interdisciplinary progression rounds. If new patient transition needs arise, please place a TOC consult.

## 2021-07-28 NOTE — Progress Notes (Signed)
Initial Nutrition Assessment  DOCUMENTATION CODES:   Severe malnutrition in context of chronic illness  INTERVENTION:   Recommend liberalizing pt diet to regular due to malnutrition. Messaged MD. Ensure Enlive po BID, each supplement provides 350 kcal and 20 grams of protein. Multivitamin w/ minerals daily Encourage good PO intake  NUTRITION DIAGNOSIS:   Severe Malnutrition related to chronic illness (chronic illness) as evidenced by percent weight loss, severe muscle depletion.  GOAL:   Patient will meet greater than or equal to 90% of their needs  MONITOR:   PO intake, Supplement acceptance  REASON FOR ASSESSMENT:   Consult, Malnutrition Screening Tool Assessment of nutrition requirement/status  ASSESSMENT:   66 y.o. female presented to the ED with nausea, dehydration, and diarrhea. PMH includes COPD, EtOH abuse, GERD, and HTN. Pt admitted with dehydration, hypomagnesemia, and neuropathy.   Pt reports that she has not had an appetite for about 8 months- 1 year. Pt reports that she practically has not ate anything in the past 5 days. Pt unable to provide typically intake at home. Reports that she was okay this morning, but then after she ate pizza for lunch she had a large amount of diarrhea.   Pt reports that she has lost ~30# since March of 2022. Reports that she has barely been able to walk at home over  the past month due to neuropathy on her feet. Pt reports using a a walker to ambulate at home. Per EMR, pt has had a 11% weight loss within 5 months, which is clinically significant for time frame.  Discussed ONS with pt, pt reports that she typically drinks 1 Ensure/day at home. Pt agreeable to ONS while here.  Medications reviewed and include: Protonix Labs reviewed: Potassium 3.3, BUN <5  NUTRITION - FOCUSED PHYSICAL EXAM:  Flowsheet Row Most Recent Value  Orbital Region No depletion  Upper Arm Region No depletion  Thoracic and Lumbar Region No depletion   Buccal Region No depletion  Temple Region No depletion  Clavicle Bone Region Mild depletion  Clavicle and Acromion Bone Region Mild depletion  Scapular Bone Region Mild depletion  Dorsal Hand Severe depletion  Patellar Region Severe depletion  Anterior Thigh Region Severe depletion  Posterior Calf Region Severe depletion  Edema (RD Assessment) None  Hair Reviewed  Eyes Reviewed  Mouth Reviewed  Skin Reviewed  Nails Reviewed   Diet Order:   Diet Order             Diet Heart Room service appropriate? Yes; Fluid consistency: Thin  Diet effective now                   EDUCATION NEEDS:   No education needs have been identified at this time  Skin:  Skin Assessment: Reviewed RN Assessment  Last BM:  PTA  Height:   Ht Readings from Last 1 Encounters:  07/27/21 5\' 3"  (1.6 m)    Weight:   Wt Readings from Last 1 Encounters:  07/27/21 75 kg    Ideal Body Weight:  52.3 kg  BMI:  Body mass index is 29.29 kg/m.  Estimated Nutritional Needs:   Kcal:  2200-2400  Protein:  110-125 grams  Fluid:  >/= 2.2 L    Andreu Drudge Louie Casa, RD, LDN Clinical Dietitian See [facility] for contact information.

## 2021-07-28 NOTE — Progress Notes (Addendum)
Family Medicine Teaching Service Daily Progress Note Intern Pager: [phone]  Patient name: Karen Casey Medical record number: [id number] Date of birth: 09-Mar-1956 Age: 66 y.o. Gender: female  Primary Care Provider: [name], MD Consultants: None  Code Status: FULL  Pt Overview and Major Events to Date:  07/27/21: Admitted  Assessment and Plan:  Karen Casey is a 66 y.o. female presenting with dehydration, nausea, and diarrhea. 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  Dehydration   Nausea, Vomiting, Diarrhea   Chronic Abdominal Pain  Pt reports this morning continued diarrhea, no vomiting. Patient ate cereal for breakfast. Received one dose of compazine ON. We will trial Rifaximin. Low threshold for further imaging, and consider advanced head imaging due to headache associated with symptoms, has longstanding HA hx and prior negative CT.  -Regular abdominal exams -Compazine 10 mg q6PRN -mIVF 147mL/hr NS -Monitor electrolytes with CBC and CMP  Weakness, Fatigue, Neuropathy Neuropathy could be associated with nutrient deficiency. Trial of TCA with patient's gabapentin. For other possible causes, Zinc, copper, vitamin B1, lead pending. RPR non-reactive. Consider neurology consult if not improving. -Follow up on labwork  -Consider neurology consult  -Continue TCA, nortriptyline  -Replete electrolytes as needed  Chronic Hyponatremia, H/o SIADH   Hypomagnesemia   Hypokalemia  Metabolic derangements are recurrent in patient and are being followed and repleted accordingly. Mag 1.7, Na 137, K 3.3-->repleted this AM. Mag sulf 2g given.  -Replete as needed -Repeat mag at 3PM  Elevated LFTs AST 37, ALT 46. Could be related to nausea and vomiting. Will continue to monitor. Hep B and C  negative.   PAF NSR today with rate of 90s-100s. Tachycardia could be related to dehydration.  -Monitor HR   HTN  Continue Amlodipine 5 mg. BP today: 117/82, 136/92.   Depression   Anxiety   H/o benzodiazepine withdrawal seizures  Current regimen Klonopin 0.5 mg BID, Atarax 25 4xdaily, Seroquel 100 mg nightly.  -Continue regimen   Chronic Hypoxemic Respiratory Failure  Currently on RA.  -Monitor Resp status   Chronic Conditions: GERD-Pantoprazole 40 mg  Tobacco Use-Nicotine Patch H/o alcohol use disorder- CIWAs not documented overnight  FEN/GI: Regular  PPx: Lovenox  Dispo:Awaiting medical optimization, pending PT/OT recommendations   Subjective:  Pt reports of continued diarrhea, no longer vomiting and ate a bowl of cereal.   Objective: Temp:  [97.3 F (36.3 C)-98.2 F (36.8 C)] 97.7 F (36.5 C) (02/14 0818) Pulse Rate:  [91-106] 98 (02/14 0818) Resp:  [16-22] 20 (02/14 0818) BP: (117-166)/(76-104) 151/93 (02/14 0818) SpO2:  [93 %-100 %] 100 % (02/14 0818) Physical Exam: General: NAD, resting in bed but somewhat anxious  Cardiovascular: RRR, no m/g/r Respiratory: CTAB with good aeration, normal wob  Abdomen: nontender and nondistended  Extremities: DP pulses palpable with pain out of proportion to exam on dorsal surface of feet   Laboratory: Recent Labs  Lab 07/27/21 0945 07/28/21 0228  WBC 6.3 5.9  HGB 14.7 12.8  HCT 43.1 38.3  PLT 255 192   Recent Labs  Lab 07/27/21 0945 07/28/21 0228  NA 132* 137  K 3.6 3.3*  CL 98 103  CO2 22 24  BUN 6* <5*  CREATININE 0.80 0.71  CALCIUM 8.4* 8.3*  PROT 5.7* 5.1*  BILITOT 1.3* 0.8  ALKPHOS 115 105  ALT 56* 46*  AST 44* 37  GLUCOSE 86 104*      Imaging/Diagnostic Tests: No new   Erskine Emery, MD 07/28/2021, 12:17 PM PGY-1, [place] Intern pager: [phone], text pages welcome

## 2021-07-28 NOTE — Progress Notes (Signed)
FPTS Brief Progress Note  S: Karen Casey reports ongoing neuropathy/pain. It is unchanged from earlier in the day and she worries it will interfere with her ability to sleep. She has not yet gotten her dose of nortriptyline for the evening.  Explained that this should help with pain and may also provide a benefit for helping to get her to sleep .She has been able to eat "a little bit" this evening.   O: BP 137/84 (BP Location: Right Arm)    Pulse (!) 102    Temp 98 F (36.7 C) (Oral)    Resp 17    Ht 5\' 3"  (1.6 m)    Wt 75 kg    SpO2 100%    BMI 29.29 kg/m   Gen: Resting in bed, awake Abd: Non-tender, non-distended, without mass  A/P: Neuropathy- nortriptyline this evening Abdominal pain- PO intake slowly picking back up. Abdominal exam benign. No indication for further imaging at this time. Trial of rifaximin in case SBBO is contributing factor.   - Orders reviewed. Labs for AM ordered, which was adjusted as needed.    Eppie Gibson, MD 07/28/2021, 9:45 PM PGY-1, [place] Night Resident  Please page [phone] with questions.

## 2021-07-29 ENCOUNTER — Other Ambulatory Visit (HOSPITAL_COMMUNITY): Payer: Self-pay

## 2021-07-29 DIAGNOSIS — R1110 Vomiting, unspecified: Secondary | ICD-10-CM

## 2021-07-29 DIAGNOSIS — R197 Diarrhea, unspecified: Secondary | ICD-10-CM | POA: Diagnosis not present

## 2021-07-29 DIAGNOSIS — Z79899 Other long term (current) drug therapy: Secondary | ICD-10-CM | POA: Diagnosis not present

## 2021-07-29 DIAGNOSIS — R110 Nausea: Secondary | ICD-10-CM

## 2021-07-29 DIAGNOSIS — R112 Nausea with vomiting, unspecified: Secondary | ICD-10-CM | POA: Diagnosis not present

## 2021-07-29 DIAGNOSIS — E860 Dehydration: Secondary | ICD-10-CM | POA: Diagnosis not present

## 2021-07-29 DIAGNOSIS — E43 Unspecified severe protein-calorie malnutrition: Secondary | ICD-10-CM | POA: Diagnosis not present

## 2021-07-29 DIAGNOSIS — E871 Hypo-osmolality and hyponatremia: Secondary | ICD-10-CM | POA: Diagnosis not present

## 2021-07-29 DIAGNOSIS — E876 Hypokalemia: Secondary | ICD-10-CM | POA: Diagnosis not present

## 2021-07-29 DIAGNOSIS — Z20822 Contact with and (suspected) exposure to covid-19: Secondary | ICD-10-CM | POA: Diagnosis not present

## 2021-07-29 LAB — COMPREHENSIVE METABOLIC PANEL
ALT: 37 U/L (ref 0–44)
AST: 36 U/L (ref 15–41)
Albumin: 2.4 g/dL — ABNORMAL LOW (ref 3.5–5.0)
Alkaline Phosphatase: 91 U/L (ref 38–126)
Anion gap: 6 (ref 5–15)
BUN: <5 mg/dL — ABNORMAL LOW (ref 8–23)
CO2: 24 mmol/L (ref 22–32)
Calcium: 8.3 mg/dL — ABNORMAL LOW (ref 8.9–10.3)
Chloride: 108 mmol/L (ref 98–111)
Creatinine, Ser: 0.56 mg/dL (ref 0.44–1.00)
GFR, Estimated: >60 mL/min (ref >60)
Glucose, Bld: 98 mg/dL (ref 70–99)
Potassium: 3.9 mmol/L (ref 3.5–5.1)
Sodium: 138 mmol/L (ref 135–145)
Total Bilirubin: 0.7 mg/dL (ref 0.3–1.2)
Total Protein: 4.9 g/dL — ABNORMAL LOW (ref 6.5–8.1)

## 2021-07-29 LAB — MAGNESIUM: Magnesium: 1.9 mg/dL (ref 1.7–2.4)

## 2021-07-29 LAB — LEAD, BLOOD (ADULT >= 16 YRS): Lead-Whole Blood: 1.3 ug/dL (ref 0.0–3.4)

## 2021-07-29 MED ORDER — RIFAXIMIN 550 MG PO TABS
550.0000 mg | ORAL_TABLET | Freq: Three times a day (TID) | ORAL | 0 refills | Status: DC
  Filled 2021-07-29: qty 60, 20d supply, fill #0

## 2021-07-29 MED ORDER — NORTRIPTYLINE HCL 10 MG PO CAPS
10.0000 mg | ORAL_CAPSULE | Freq: Every day | ORAL | 0 refills | Status: DC
  Filled 2021-07-29: qty 30, 30d supply, fill #0

## 2021-07-29 MED ORDER — CLONAZEPAM 0.5 MG PO TABS
0.5000 mg | ORAL_TABLET | Freq: Two times a day (BID) | ORAL | 0 refills | Status: DC
  Filled 2021-07-29: qty 30, 15d supply, fill #0

## 2021-07-29 MED ORDER — QUETIAPINE FUMARATE 25 MG PO TABS
75.0000 mg | ORAL_TABLET | Freq: Every day | ORAL | 0 refills | Status: DC
  Filled 2021-07-29: qty 60, 20d supply, fill #0

## 2021-07-29 NOTE — TOC Benefit Eligibility Note (Signed)
Patient Research scientist (life sciences) completed.   PA was required and has been approved. LFY:[id number]   The patient is currently admitted and upon discharge could be taking XIFAXAN (RIFAXIMIN) 550MG .   The current 30 day co-pay is, $0.   The patient is insured through [employer].

## 2021-07-29 NOTE — Hospital Course (Addendum)
[name] is a 66 y.o. 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 Hospital course outlined by problem below:  Dehydration secondary to gastroenteritis Noted to have hypomagnesemia, with chronic hyponatremia found on admission, as well as hypokalemia during stay. There were no signs of acute abdomen, and CT abd/pelvis in 05/2021 showed some fluid scattered in nondistended small bowel and colon which may be consistent with enteritis. She was treated symptomatically with Compazine and IV fluids. Her electrolyte derangements were repleted accordingly. Thought to be related to SBBO, so Rifaximin was provided as well. Continued Rifaximin on discharge.   Weakness, Fatigue, Neuropathy Repletion of electrolytes, ordered labwork that could cause severe neuropathy (zinc, copper, lead, RPR-nonreactive, vitamin B1). Most are still pending results. We also gave Nortriptyline and subsequently decreased Seroquel dosage on d/c to decrease anticholinergic effects of medications.   Depression   Anxiety   H/o benzodiazepine withdrawal seizures  Home medications: Clonazepam 0.5 mg TID PRN (PCP Dr. Thompson Grayer wanted to taper at next visit to BID), Hydroxyzine 25 mg 4xdaily as needed, Seroquel 75 mg nightly. Pt is amenable to decreasing Klonopin dosage to BID, was kept on this dose in hospital and discharged with decreased dose.  All other conditions chronic and stable.    Items for Follow Up: Follow up zinc, copper, lead, vitamin B1 labs  CMP and Mag needed on f/u  Discharged on decreased seroquel and klonopin dosage as above  Recommend outpatient neurology referral for neuropathy

## 2021-07-29 NOTE — Progress Notes (Signed)
CM consulted for medication assistance.Patient already has insurance. CM clarified with MD via secure chat on which medication assistance is needed. MD stated to check the cost of Rifaximin and that pharmacy is looking into it. Benefit check sent. CM will continue to follow with needs.

## 2021-07-29 NOTE — Discharge Instructions (Addendum)
Dear Karen Casey,   Thank you so much for allowing [place] to be part of your care!  You were admitted to [facility] for stomach pain, vomiting, diarrhea, and neuropathy. We checked some labs that have all been negative so far. We are still waiting on zinc, copper, vitamin B1, lead. Continue with Nortriptyline and Rifaximin as well.    POST-HOSPITAL & CARE INSTRUCTIONS Follow up with Dr. Thompson Grayer on 2/24 @9 :50AM  Please let PCP/Specialists know of any changes that were made.  Please see medications section of this packet for any medication changes.   DOCTOR'S APPOINTMENT & FOLLOW UP CARE INSTRUCTIONS  Future Appointments  Date Time Provider [place]  08/07/2021  9:50 AM Pray, Norwood Levo, MD [facility] [place]    RETURN PRECAUTIONS:   Take care and be well!  [facility]  [place], [place] [address] [phone]

## 2021-07-29 NOTE — Progress Notes (Signed)
Discharge instructions given. Patient verbalized understanding and all questions were answered. Patient awaiting daughter to take her home.

## 2021-07-29 NOTE — Progress Notes (Signed)
Occupational Therapy Evaluation Patient Details Name: Karen Casey MRN: [id number] DOB: 04/24/1956 Today's Date: 07/29/2021   History of Present Illness 66 y.o. female presenting with dehydration, nausea, and diarrhea. 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   Clinical Impression   PTA patient was living with her spouse in a private residence and was grossly Mod I with ADLs/IADLs with and without use of RW. Patient currently functioning below baseline demonstrating observed ADLs with Min guard to Min A overall. Patient also limited by deficits listed below including generalized weakness, decreased activity tolerance and balance deficits and would benefit from continued acute OT services in prep for safe d/c home. Patient active with outpatient PT and may progress beyond need for [facility] services at time of d/c. OT will continue to follow acutely.        Recommendations for follow up therapy are one component of a multi-disciplinary discharge planning process, led by the attending physician.  Recommendations may be updated based on patient status, additional functional criteria and insurance authorization.   Follow Up Recommendations  Home health OT    Assistance Recommended at Discharge Frequent or constant Supervision/Assistance  Patient can return home with the following A lot of help with walking and/or transfers;A little help with bathing/dressing/bathroom    Functional Status Assessment  Patient has had a recent decline in their functional status and demonstrates the ability to make significant improvements in function in a reasonable and predictable amount of time.  Equipment Recommendations  None recommended by OT    Recommendations for Other Services       Precautions / Restrictions  Precautions Precautions: Fall Restrictions Weight Bearing Restrictions: No      Mobility Bed Mobility Overal bed mobility: Needs Assistance Bed Mobility: Supine to Sit, Sit to Supine     Supine to sit: Supervision Sit to supine: Supervision   General bed mobility comments: Supervision A for safety; use of bed features    Transfers Overall transfer level: Needs assistance Equipment used: Rolling walker (2 wheels) Transfers: Sit to/from Stand Sit to Stand: Min assist, From elevated surface, Min guard           General transfer comment: Able to stand from elevated EOB with Min guard. Requires increased assist from lower surfaces.      Balance Overall balance assessment: Needs assistance Sitting-balance support: No upper extremity supported, Feet unsupported Sitting balance-Leahy Scale: Good     Standing balance support: No upper extremity supported, During functional activity Standing balance-Leahy Scale: Poor Standing balance comment: Reliant on BUE support on RW.                           ADL either performed or assessed with clinical judgement   ADL Overall ADL's : Needs assistance/impaired Eating/Feeding: Independent   Grooming: Min guard;Standing Grooming Details (indicate cue type and reason): 2/3 grooming tasks standing at sink level. Rests bilateral forearms on sink surface. Upper Body Bathing: Set up;Sitting   Lower Body Bathing: Minimal assistance;Sit to/from stand   Upper Body Dressing : Set up;Sitting   Lower Body Dressing: Minimal assistance;Sit to/from stand   Toilet Transfer: Minimal assistance                   Vision Baseline Vision/History: 1 Wears glasses (readers) Ability to See in Adequate  Light: 0 Adequate Patient Visual Report: No change from baseline Vision Assessment?: No apparent visual deficits     Perception     Praxis      Pertinent Vitals/Pain Pain Assessment Pain Assessment: 0-10 Pain Score: 5   Pain Location: bil feet/legs Pain Descriptors / Indicators: Shooting, Sharp Pain Intervention(s): Monitored during session, Limited activity within patient's tolerance, Repositioned     Hand Dominance Right   Extremity/Trunk Assessment Upper Extremity Assessment Upper Extremity Assessment: Generalized weakness RUE Sensation: history of peripheral neuropathy LUE Sensation: history of peripheral neuropathy   Lower Extremity Assessment Lower Extremity Assessment: Generalized weakness;RLE deficits/detail;LLE deficits/detail RLE Sensation: history of peripheral neuropathy LLE Sensation: history of peripheral neuropathy   Cervical / Trunk Assessment Cervical / Trunk Assessment: Normal   Communication Communication Communication: No difficulties   Cognition Arousal/Alertness: Awake/alert Behavior During Therapy: Anxious (tearful) Overall Cognitive Status: Within Functional Limits for tasks assessed                                 General Comments: Emotionally labile; tearful and upset about neuropathy. Perseverates on wanting to return home.     General Comments  Declined ambulation in hallway but in agreement with mobility in room.    Exercises     Shoulder Instructions      Home Living Family/patient expects to be discharged to:: Private residence Living Arrangements: Spouse/significant other Available Help at Discharge: Family;Available 24 hours/day Type of Home: House Home Access: Ramped entrance     Home Layout: One level     Bathroom Shower/Tub: Teacher, early years/pre: Handicapped height Bathroom Accessibility: Yes   Home Equipment: Conservation officer, nature (2 wheels);BSC/3in1          Prior Functioning/Environment Prior Level of Function : Independent/Modified Independent             Mobility Comments: Ambualtes in home without AD vs HHA from spouse vs use of RW. ADLs Comments: Mod I with bathing/dressing/toileting with AD/DME.  Fear of falling limiting bathing at shower level.        OT Problem List: Decreased strength;Decreased activity tolerance;Impaired balance (sitting and/or standing)      OT Treatment/Interventions: Self-care/ADL training;Therapeutic exercise;Energy conservation;DME and/or AE instruction;Patient/family education;Balance training    OT Goals(Current goals can be found in the care plan section) Acute Rehab OT Goals Patient Stated Goal: To return home. OT Goal Formulation: With patient Time For Goal Achievement: 08/12/21 Potential to Achieve Goals: Good  OT Frequency: Min 2X/week    Co-evaluation              AM-PAC OT "6 Clicks" Daily Activity     Outcome Measure Help from another person eating meals?: None Help from another person taking care of personal grooming?: A Little Help from another person toileting, which includes using toliet, bedpan, or urinal?: A Little Help from another person bathing (including washing, rinsing, drying)?: A Little Help from another person to put on and taking off regular upper body clothing?: A Little Help from another person to put on and taking off regular lower body clothing?: A Little 6 Click Score: 19   End of Session Equipment Utilized During Treatment: Gait belt;Rolling walker (2 wheels) Nurse Communication: Mobility status  Activity Tolerance: Patient tolerated treatment well Patient left: in bed;with call bell/phone within reach;with bed alarm set  OT Visit Diagnosis: Unsteadiness on feet (R26.81);Muscle weakness (generalized) (M62.81)  Time: 7953-6922 OT Time Calculation (min): 28 min Charges:  OT General Charges $OT Visit: 1 Visit OT Evaluation $OT Eval Low Complexity: 1 Low OT Treatments $Self Care/Home Management : 8-22 mins  Nannette Zill H. OTR/L Supplemental OT, Department of rehab services [phone]  Auriel Kist R H. 07/29/2021, 10:06 AM

## 2021-07-29 NOTE — Discharge Summary (Signed)
[facility] Discharge Summary  Patient name: Karen Casey Medical record number: [id number] Date of birth: Jan 14, 1956 Age: 66 y.o. Gender: female Date of Admission: 07/27/2021  Date of Discharge: 07/29/21 Admitting Physician: Martyn Malay, MD  Primary Care Provider: [name], MD Consultants: None   Indication for Hospitalization: Neuropathy, diarrhea, vomiting, nausea  Discharge Diagnoses/Problem List:  Principal Problem:   Hypomagnesemia Active Problems:   Depression   Diarrhea   Hyponatremia   Hypokalemia   Fatigue   Dehydration   Protein-calorie malnutrition, severe   Vomiting  Disposition: Home with [facility] OT  Discharge Condition: Stable   Discharge Exam: Blood pressure 140/89, pulse (!) 102, temperature 97.7 F (36.5 C), temperature source Oral, resp. rate (!) 22, height 5\' 3"  (1.6 m), weight 75 kg, SpO2 95 %. Physical Exam: General: NAD, resting in bed, nontoxic appearing  Cardiovascular: RRR, no m/r/g Respiratory: CTAB, no stridor/wheezing/rhonchi Abdomen: Midline tenderness, normoactive bowel sounds, soft Extremities: Tenderness over dorsal aspect of feet, no edema  Brief Hospital Course:  CHEN HOLZMAN is a 66 y.o. 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 Hospital course outlined by problem below:  Dehydration secondary to gastroenteritis Noted to have hypomagnesemia, with chronic hyponatremia found on admission, as well as hypokalemia during stay. There were no signs of acute abdomen, and CT abd/pelvis in 05/2021 showed some fluid scattered in nondistended small bowel and colon which may be consistent with enteritis. She was treated  symptomatically with Compazine and IV fluids. Her electrolyte derangements were repleted accordingly. Thought to be related to SBBO, so Rifaximin was provided as well. Continued Rifaximin on discharge.   Weakness, Fatigue, Neuropathy Repletion of electrolytes, ordered labwork that could cause severe neuropathy (zinc, copper, lead, RPR-nonreactive, vitamin B1). Most are still pending results. We also gave Nortriptyline and subsequently decreased Seroquel dosage on d/c to decrease anticholinergic effects of medications.   Depression   Anxiety   H/o benzodiazepine withdrawal seizures  Home medications: Clonazepam 0.5 mg TID PRN (PCP Dr. Thompson Grayer wanted to taper at next visit to BID), Hydroxyzine 25 mg 4xdaily as needed, Seroquel 75 mg nightly. Pt is amenable to decreasing Klonopin dosage to BID, was kept on this dose in hospital and discharged with decreased dose.  All other conditions chronic and stable.    Items for Follow Up: Follow up zinc, copper, lead, vitamin B1 labs  CMP and Mag needed on f/u  Discharged on decreased seroquel and klonopin dosage as above  Recommend outpatient neurology referral for neuropathy    Significant Procedures: None   Significant Labs and Imaging:  Recent Labs  Lab 07/27/21 0945 07/28/21 0228  WBC 6.3 5.9  HGB 14.7 12.8  HCT 43.1 38.3  PLT 255 192   Recent Labs  Lab 07/27/21 0945 07/27/21 2109 07/28/21 0228 07/28/21 0544 07/28/21 1519 07/29/21 0214  NA 132*  --  137  --   --  138  K 3.6  --  3.3*  --   --  3.9  CL 98  --  103  --   --  108  CO2 22  --  24  --   --  24  GLUCOSE 86  --  104*  --   --  98  BUN 6*  --  <  5*  --   --  <5*  CREATININE 0.80  --  0.71  --   --  0.56  CALCIUM 8.4*  --  8.3*  --   --  8.3*  MG 1.1* 1.7  --  1.8 2.2 1.9  ALKPHOS 115  --  105  --   --  91  AST 44*  --  37  --   --  36  ALT 56*  --  46*  --   --  37  ALBUMIN 2.7*  --  2.5*  --   --  2.4*      Results/Tests Pending at Time of Discharge:  Unresulted  Labs (From admission, onward)     Start     Ordered   07/28/21 0500  Vitamin B1  Tomorrow morning,   R        07/27/21 1854   07/28/21 0500  Copper, serum  Tomorrow morning,   R        07/27/21 1854   07/28/21 0500  Zinc  Tomorrow morning,   R        07/27/21 1854   07/28/21 0400  Lead, blood (adult age 69 yrs or greater)  Once,   R        07/28/21 0400             Discharge Medications:  Allergies as of 07/29/2021       Reactions   Capsaicin-menthol    Burning and peeling on area applied to   Diclo Gel [diclofenac Sodium]    GEL AND CREAM-burning and peeling at the sight of application.        Medication List     TAKE these medications    acetaminophen 325 MG tablet Commonly known as: TYLENOL Take 650 mg by mouth every 6 (six) hours as needed for mild pain or headache.   albuterol 108 (90 Base) MCG/ACT inhaler Commonly known as: VENTOLIN HFA TAKE 2 PUFFS BY MOUTH EVERY 6 HOURS AS NEEDED FOR WHEEZE OR SHORTNESS OF BREATH What changed: See the new instructions.   amLODipine 5 MG tablet Commonly known as: NORVASC Take 1 tablet (5 mg total) by mouth daily.   Anoro Ellipta 62.5-25 MCG/ACT Aepb Generic drug: umeclidinium-vilanterol INHALE 1 PUFF BY MOUTH EVERY DAY What changed: See the new instructions.   CALCIUM-MAGNESIUM-ZINC PO Take 1 tablet by mouth daily.   CENTRUM SILVER 50+WOMEN PO Take 1 tablet by mouth daily.   clonazePAM 0.5 MG tablet Commonly known as: KLONOPIN Take 1 tablet (0.5 mg total) by mouth 2 (two) times daily. What changed: See the new instructions.   dicyclomine 20 MG tablet Commonly known as: BENTYL Take 1 tablet (20 mg total) by mouth 3 (three) times daily as needed for up to 30 doses for spasms.   DULoxetine 30 MG capsule Commonly known as: CYMBALTA Take 30 mg by mouth 2 (two) times daily.   esomeprazole 40 MG capsule Commonly known as: NEXIUM TAKE 1 CAPSULE BY MOUTH EVERY DAY AT NOON What changed: See the new  instructions.   feeding supplement Liqd Take 237 mLs by mouth 2 (two) times daily between meals.   gabapentin 600 MG tablet Commonly known as: NEURONTIN Take 1 tablet (600 mg total) by mouth 3 (three) times daily.   hydrOXYzine 25 MG capsule Commonly known as: VISTARIL Take 1 capsule (25 mg total) by mouth every 6 (six) hours as needed for anxiety or itching.   nicotine 21 mg/24hr patch Commonly known  as: NICODERM CQ - dosed in mg/24 hours Place 1 patch (21 mg total) onto the skin daily.   NON FORMULARY O2 use as needed   nortriptyline 10 MG capsule Commonly known as: PAMELOR Take 1 capsule (10 mg total) by mouth at bedtime.   ondansetron 8 MG disintegrating tablet Commonly known as: ZOFRAN-ODT Take 1 tablet (8 mg total) by mouth every 8 (eight) hours as needed for nausea or vomiting.   potassium chloride 10 MEQ tablet Commonly known as: KLOR-CON Take 1 tablet (10 mEq total) by mouth daily for 30 doses.   promethazine 12.5 MG suppository Commonly known as: PHENERGAN Place 12.5 mg rectally every 4 (four) hours as needed for nausea/vomiting.   QUEtiapine 25 MG tablet Commonly known as: SEROQUEL Take 3 tablets (75 mg total) by mouth at bedtime. What changed:  medication strength how much to take Another medication with the same name was removed. Continue taking this medication, and follow the directions you see here.   Xifaxan 550 MG Tabs tablet Generic drug: rifaximin Take 1 tablet (550 mg total) by mouth 3 (three) times daily.        Discharge Instructions: Please refer to Patient Instructions section of EMR for full details.  Patient was counseled important signs and symptoms that should prompt return to medical care, changes in medications, dietary instructions, activity restrictions, and follow up appointments.   Follow-Up Appointments:  Follow-up Information     Pray, Norwood Levo, MD. Go on 08/07/2021.   Specialty: Family Medicine Why: appt at  09:50AM Contact information: [address] [place] [address] [phone]         [facility] Follow up.   Specialty: Rehabilitation Why: resume therapy services as established prior to admission Contact information: [address] [phone] [place]                Erskine Emery, MD 07/29/2021, 2:33 PM PGY-1, [place]

## 2021-07-29 NOTE — Evaluation (Signed)
Physical Therapy Evaluation Patient Details Name: SALIMA RUMER MRN: [id number] DOB: [birth year] Today's Date: 07/29/2021  History of Present Illness  66 y.o. female presenting with dehydration, nausea, and diarrhea. 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  Clinical Impression   Pt admitted secondary to problem above with deficits below. PTA patient was living with spouse in a one level home with ramped entrance. She was independent with ambulation inside home and occasionally held her husband's arm in community. Patient encountered leaving the bathroom with NT using RW and required only minguard assist to walk back to bed. She refused to attempt walking without device due to LE pain and wanting to eat her breakfast.  Patient hopeful to return home today and with her current support system and DME this is feasible. She would like to resume OPPT that she has been participating in. Anticipate patient will benefit from PT to address problems listed below.Will continue to follow acutely to maximize functional mobility independence and safety.          Recommendations for follow up therapy are one component of a multi-disciplinary discharge planning process, led by the attending physician.  Recommendations may be updated based on patient status, additional functional criteria and insurance authorization.  Follow Up Recommendations Outpatient PT (resume (pt reports has been going PTA))    Assistance Recommended at Discharge PRN  Patient can return home with the following  A little help with walking and/or transfers;Assistance with cooking/housework;Help with stairs or ramp for entrance    Equipment Recommendations None recommended by PT  Recommendations for Other Services  OT consult    Functional Status Assessment  Patient has had a recent decline in their functional status and demonstrates the ability to make significant improvements in function in a reasonable and predictable amount of time.     Precautions / Restrictions Precautions Precautions: Fall Restrictions Weight Bearing Restrictions: No      Mobility  Bed Mobility               General bed mobility comments: pt in bathroom on arrival and returned to sit EOB (refused chair and refused to lie down); per NT did not need assist    Transfers Overall transfer level: Needs assistance Equipment used: Rolling walker (2 wheels) Transfers: Sit to/from Stand Sit to Stand: Mod assist (per NT; pt refused to repeat with PT)           General transfer comment: from standard toilet    Ambulation/Gait Ambulation/Gait assistance: Min guard Gait Distance (Feet): 12 Feet Assistive device: Rolling walker (2 wheels) Gait Pattern/deviations: Step-through pattern, Decreased stride length, Shuffle Gait velocity: decr     General Gait Details: pt adamant she would only walk from bathroom to bed  Stairs            Wheelchair Mobility    Modified Rankin (Stroke Patients Only)       Balance Overall balance assessment: Needs assistance Sitting-balance support: No upper extremity supported, Feet unsupported Sitting balance-Leahy Scale: Good     Standing balance support: No upper extremity supported, During functional activity Standing balance-Leahy Scale: Fair                               Pertinent Vitals/Pain Pain Assessment Pain Assessment: Faces Faces Pain Scale: Hurts  whole lot Pain Location: bil feet/legs Pain Descriptors / Indicators: Shooting, Sharp Pain Intervention(s): Limited activity within patient's tolerance, Monitored during session    [place] expects to be discharged to:: Private residence Living Arrangements: Spouse/significant other Available Help at Discharge:  Family;Available 24 hours/day Type of Home: House Home Access: Ramped entrance       Home Layout: One level Home Equipment: Conservation officer, nature (2 wheels);BSC/3in1      Prior Function Prior Level of Function : Independent/Modified Independent             Mobility Comments: uses no device in home; holds husband's arm sometimes in community; reports only uses oxygen at night ADLs Comments: reports modified independent with bathing  (sits on BSC inside tube) and independent with other ADLs     Hand Dominance        Extremity/Trunk Assessment   Upper Extremity Assessment Upper Extremity Assessment: Defer to OT evaluation    Lower Extremity Assessment Lower Extremity Assessment: Generalized weakness;RLE deficits/detail;LLE deficits/detail RLE Sensation: history of peripheral neuropathy LLE Sensation: history of peripheral neuropathy    Cervical / Trunk Assessment Cervical / Trunk Assessment: Normal  Communication   Communication: No difficulties  Cognition Arousal/Alertness: Awake/alert Behavior During Therapy: Anxious (tearful) Overall Cognitive Status: Within Functional Limits for tasks assessed                                 General Comments: cognition not specifically assessed; pt tearful and upset about neuropathy and wnting to go home        General Comments General comments (skin integrity, edema, etc.): Patient initially resistant to PT evaluation, however when explaine role is to help her go home, she agreed to work with PT and answer questions (although self-limited her mobility she would complete)    Exercises     Assessment/Plan    PT Assessment Patient needs continued PT services  PT Problem List Decreased strength;Decreased activity tolerance;Decreased balance;Decreased mobility;Decreased knowledge of use of DME;Impaired sensation;Pain       PT Treatment Interventions DME instruction;Gait training;Functional mobility  training;Therapeutic activities;Therapeutic exercise;Patient/family education    PT Goals (Current goals can be found in the Care Plan section)  Acute Rehab PT Goals Patient Stated Goal: go home soon PT Goal Formulation: With patient Time For Goal Achievement: 08/12/21 Potential to Achieve Goals: [place] (depending on pt participation)    Frequency Min 3X/week     Co-evaluation               AM-PAC PT "6 Clicks" Mobility  Outcome Measure Help needed turning from your back to your side while in a flat bed without using bedrails?: None Help needed moving from lying on your back to sitting on the side of a flat bed without using bedrails?: None Help needed moving to and from a bed to a chair (including a wheelchair)?: A Little Help needed standing up from a chair using your arms (e.g., wheelchair or bedside chair)?: A Little Help needed to walk in hospital room?: A Little Help needed climbing 3-5 steps with a railing? : A Lot 6 Click Score: 19    End of Session   Activity Tolerance: Patient limited by pain (neuropathy) Patient left: in bed;with call bell/phone within reach;with bed alarm set (sitting EOB with breakfast at pt's insistence) Nurse Communication: Mobility status;Other (comment) (location, with bed alarm set) PT Visit Diagnosis: Difficulty in walking, not elsewhere classified (R26.2)  Time: 6759-1638 PT Time Calculation (min) (ACUTE ONLY): 13 min   Charges:   PT Evaluation $PT Eval Low Complexity: [place], PT Acute Rehabilitation Services  Pager [phone] Office [phone]   Rexanne Mano 07/29/2021, 8:50 AM

## 2021-07-29 NOTE — Progress Notes (Signed)
Family Medicine Teaching Service Daily Progress Note Intern Pager: [phone]  Patient name: Karen Casey Medical record number: [id number] Date of birth: 04-May-1956 Age: 66 y.o. Gender: female  Primary Care Provider: [name], MD Consultants: None  Code Status: FULL   Pt Overview and Major Events to Date:  07/27/21: Admitted  Assessment and Plan:  Karen Casey is a 66 y.o. female presenting with dehydration, nausea, and diarrhea. 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  Dehydration   Nausea, Vomiting, Diarrhea   Chronic Abdominal Pain  Today, she reports improvement of diarrhea, none overnight. No Compazine ON. No current need for additional CT imaging at this time. On Rifaximin for questionable SBBO, pt reports that her vomiting and diarrhea have resolved. Awaiting prior auth for Rifaximin cost.   -Regular abdominal exams  -Compazine 10 mg q6PRN  -mIVF 50 NS> consider trial off of fluids  Weakness, Fatigue, Neuropathy  Trial of TCA, still has neuropathy but seems to clinically be improved today. Zinc, Copper, Vitamin B1, lead pending. Monitoring for electrolyte abnormalities. EKG for QTC of 454.  -Follow up pending labs  -Continue Nortriptyline  -Replete lytes  -Consider neurology consult   Chronic Hyponatremia, h/o SIADH   Hypomagnesemia   Hypokalemia  Electrolyte derangements improving with supplementation. Mag 2.2, Na 138, K 3.9.  -Replete as needed and monitor   Elevated LFTS, resolved  -Hep B and C negative  -Monitor   PAF No AC with [place] VASC 1, NSR with rate 90s-100s.  -Monitor HR   HTN  Continue Amlodipine 5 mg. BP today: 140/89, 120/94.   Depression   Anxiety   H/o benzo withdrawal seizures  Continue Klonopin 0.5 mg BID, Atarax 25 mg 4xdaily, Seroquel 75 mg  at night (decreased with TCA).   Chronic and Stable conditions:  GERD: Pantoprazole 40 mg  Tobacco use: Nicotine patch  Alcohol Use Disorder history: CIWAs not documented, although ordered    FEN/GI: Regular  PPx: Lovenox  Dispo: Home today, discharge summary to follow, awaiting Rifaximin authorization.   Subjective:  Patient reports that she wants to go home. She still has some neuropathy but no vomiting or diarrhea.   Objective: Temp:  [97.7 F (36.5 C)-98 F (36.7 C)] 97.7 F (36.5 C) (02/15 0748) Pulse Rate:  [93-102] 99 (02/15 0400) Resp:  [17-22] 22 (02/15 0400) BP: (105-140)/(71-94) 140/89 (02/15 0748) SpO2:  [95 %-100 %] 95 % (02/15 0400) Physical Exam: General: NAD, resting in bed, nontoxic appearing  Cardiovascular: RRR, no m/r/g Respiratory: CTAB, no stridor/wheezing/rhonchi Abdomen: Midline tenderness, normoactive bowel sounds, soft Extremities: Tenderness over dorsal aspect of feet, no edema  Laboratory: Recent Labs  Lab 07/27/21 0945 07/28/21 0228  WBC 6.3 5.9  HGB 14.7 12.8  HCT 43.1 38.3  PLT 255 192   Recent Labs  Lab 07/27/21 0945 07/28/21 0228 07/29/21 0214  NA 132* 137 138  K 3.6 3.3* 3.9  CL 98 103 108  CO2 22 24 24   BUN 6* <5* <5*  CREATININE 0.80 0.71 0.56  CALCIUM 8.4* 8.3* 8.3*  PROT 5.7* 5.1* 4.9*  BILITOT 1.3* 0.8 0.7  ALKPHOS 115 105 91  ALT 56* 46* 37  AST 44* 37 36  GLUCOSE 86 104* 98      Erskine Emery, MD 07/29/2021, 9:26 AM PGY-1, [place] Intern pager:  [phone], text pages welcome

## 2021-07-29 NOTE — TOC Transition Note (Signed)
Transition of Care [facility]) - CM/SW Discharge Note   Patient Details  Name: Karen Casey MRN: [id number] Date of Birth: 01-20-1956  [facility] CM/SW Contact:  Carles Collet, RN Phone Number: 07/29/2021, 2:28 PM   Clinical Narrative:    Patient will resume outpatient therapy services as established prior to admission. No DME needs. Prior auth for [employer] completed by pharmacy and meds filled through [facility]. No other TOC needs identified.          Patient Goals and CMS Choice        Discharge Placement                       Discharge Plan and Services                                     Social Determinants of Health (SDOH) Interventions     Readmission Risk Interventions No flowsheet data found.

## 2021-07-30 ENCOUNTER — Other Ambulatory Visit: Payer: Self-pay

## 2021-07-30 LAB — VITAMIN B1: Vitamin B1 (Thiamine): 108.3 nmol/L (ref 66.5–200.0)

## 2021-07-30 LAB — COPPER, SERUM: Copper: 87 ug/dL (ref 80–158)

## 2021-07-30 LAB — ZINC: Zinc: 66 ug/dL (ref 44–115)

## 2021-07-30 MED ORDER — GABAPENTIN 600 MG PO TABS: 600.0000 mg | ORAL_TABLET | Freq: Three times a day (TID) | ORAL | 0 refills | Status: DC

## 2021-08-01 ENCOUNTER — Other Ambulatory Visit: Payer: Self-pay | Admitting: Family Medicine

## 2021-08-01 DIAGNOSIS — F419 Anxiety disorder, unspecified: Secondary | ICD-10-CM

## 2021-08-05 ENCOUNTER — Other Ambulatory Visit: Payer: Self-pay | Admitting: *Deleted

## 2021-08-05 MED ORDER — GABAPENTIN 600 MG PO TABS: 600.0000 mg | ORAL_TABLET | Freq: Three times a day (TID) | ORAL | 0 refills | Status: DC

## 2021-08-05 NOTE — Progress Notes (Signed)
SUBJECTIVE:   CHIEF COMPLAINT / HPI:   Hospital follow-up: Peripheral neuropathy- started on TCA nortriptyline 10mg  in hospital, unsure if it is improving or helping.  Consider neurology referral. Metal screening was normal including Lead 1.3, zinc 66, copper 87, and RPR negative. Vitamin B12 848 normal. Vitamin B1 108 normal.  Diarrhea and electrolyte abnormalities- started on rifaxamin for total of 14 days for possible bacterial overgrowth. Symptoms improved in hospital. Will repeat electrolytes today.  Notes that her diarrhea still continues and is about the same, she is not sure that the rifaximin helped.  Alcohol use disorder- last drink a few days ago, she notes that she has had to drink since discharge from the hospital on 2 different occasions.  She notes that is the only thing that seems to help with her anxiety and pain.  Anxiety and depression- decreased to Klonopin 0.5mg  BID, this was started on hospital admission 07/27/21, plan to do one month and then decrease to 0.25 mg BID (likely with next refill). On Seroquel 75 mg qHS (decreased in hospital due to starting nortryptyline). Also taking hydroxyzine 25mg  QID but she is not sure if this is helping.  She notes that she is out of her Klonopin "I may have taken more than 2 some days."  Foot injury-she notes that her left foot tripped over a box when she was walking to get food in the middle the night, and it has some pain and swelling and bruising.  She has trouble walking on it.  Also have some left ankle pain  Concern for suicidal ideation-patient originally placed a 1 for question #9 on the PHQ-9, when asked if she had thoughts of being better off dead or not being here she said "I did not mean to circle out."  She denies any plan or intent, she notes she cares too much about the people that love her to ever want to hurt herself.  PERTINENT  PMH / PSH: HTN, paroxysmal Afib, GERD, alcohol use disorder, peripheral neuropathy, COPD,  tobacco use disoder, osteoporosis with history of fragility thoracic spine fracture, low back pain, hyponatremia  OBJECTIVE:   BP 107/77    Pulse (!) 112    Wt 160 lb (72.6 kg)    BMI 28.34 kg/m   General: A&O, NAD HEENT: No sign of trauma, EOM grossly intact Cardiac: RRR, no m/r/g Respiratory: CTAB, normal WOB, no w/c/r GI: Soft, NTTP, non-distended  Extremities: Bilateral feet extremely sensitive to light touch, left lateral midfoot with ecchymoses, tender to touch as well as the posterior aspects of the medial and lateral malleolus, negative calf squeeze test, difficult to examine for further testing due to extreme pain and touching any area of the midfoot, no peripheral edema. Neuro: Normal gait, moves all four extremities appropriately. Psych: Appropriate mood and affect   ASSESSMENT/PLAN:   Left foot pain - With ecchymosis and worsening pain concern for fracture, will get foot and ankle x-rays due to the diffuse sensitivity  Hypomagnesemia Repeat lab work today  Hypocalcemia - We will repeat CMP today  Anxiety disorder - Discussed with patient and her Sister Jackelyn Poling in room that with her continued alcohol use it is unsafe for me to continue to prescribe benzodiazepines, discussed the best plan going forward would be to call psychiatry as I have already placed a referral multiple times and to get in with them to talk about other medications for her anxiety as she has been intolerant of SSRIs - We will refill Klonopin  but only  For 0.25 mg twice daily or 0.5 mg daily, will give 1 month supply - Discussed that I will not refill this early and that if she is not taking it appropriately I will no longer prescribe benzodiazepines for her as this will now be a low enough taper for her to discontinue - Again encouraged therapy as I feel working on coping strategies could be helpful, she declines at this time -Reiterated the high risk including oversedation and death of taking  benzodiazepines and drinking alcohol together and why is appropriate for me to taper off of these, reiterated however tried multiple times to refer to psychiatry as I feel like she has not been successful on many other medications   Peripheral neuropathy - All heavy metal and vitamin testing and syphilis testing has been normal to this point, will refer to neurology, will increase nortriptyline to 25 mg nightly as she may have received some benefit from this -Suspect this may be due to alcohol use versus idiopathic as all other work-up has been negative to this point  Alcohol use - Discussed cessation as the best thing for her likely her diarrhea, electrolyte abnormalities, and peripheral neuropathy going forward   After visit was notified that patient refused lab draws today.  [name], MD [place]

## 2021-08-06 ENCOUNTER — Other Ambulatory Visit: Payer: Self-pay | Admitting: Gastroenterology

## 2021-08-06 NOTE — Therapy (Incomplete)
OUTPATIENT PHYSICAL THERAPY TREATMENT NOTE   Patient Name: Karen Casey MRN: [id number] DOB:11-30-1955, 66 y.o., female Today's Date: 08/06/2021  PCP: [name], MD REFERRING PROVIDER: [name], MD      Past Medical History:  Diagnosis Date   Alcohol abuse    last use 03/09/21, marijuana last 03/09/21   Allergy    Anxiety    Cataract 06/09/2012   Right eye and left eye   COPD (chronic obstructive pulmonary disease) (HCC)    Depression    Dyspnea    uses oxygen 2L via [place] prn   GERD (gastroesophageal reflux disease)    Headache    Hypertension    Long term (current) use of anticoagulants    Neuromuscular disorder ([place])    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   Seizure ([place])    08/2020 per patient   Seizures ([place])    xanax withdrawl- December 2013   Urinary incontinence 06/09/2012   Past Surgical History:  Procedure Laterality Date   APPENDECTOMY     BIOPSY  01/04/2021   Procedure: BIOPSY;  Surgeon: Yetta Flock, MD;  Location: [facility] [place];  Service: Gastroenterology;;   BIOPSY  03/12/2021   Procedure: BIOPSY;  Surgeon: Irving Copas., MD;  Location: [place];  Service: Gastroenterology;;   CATARACT EXTRACTION  06/09/2012   Left eye   COLONOSCOPY WITH PROPOFOL N/A 03/12/2021   Procedure: COLONOSCOPY WITH PROPOFOL;  Surgeon: Irving Copas., MD;  Location: [place] [facility];  Service: Gastroenterology;  Laterality: N/A;   ESOPHAGOGASTRODUODENOSCOPY (EGD) WITH PROPOFOL N/A 01/04/2021   Procedure: ESOPHAGOGASTRODUODENOSCOPY (EGD) WITH PROPOFOL;  Surgeon: Yetta Flock, MD;  Location: [place];  Service: Gastroenterology;  Laterality: N/A;   left shoulder dislocation  Sept 2011   POLYPECTOMY  03/12/2021   Procedure: POLYPECTOMY;  Surgeon: Mansouraty, Telford Nab., MD;  Location: [facility];  Service: Gastroenterology;;   Patient Active Problem List   Diagnosis Date Noted   Vomiting 07/29/2021    Protein-calorie malnutrition, severe 07/28/2021   Dehydration    Vitamin D deficiency 07/03/2021   Hypocalcemia 06/21/2021   Peripheral neuropathy    Seborrheic keratoses 05/29/2021   Hepatic steatosis 01/05/2021   Fatigue    Hypokalemia 01/02/2021   Chronic obstructive pulmonary disease, group B, by Global Initiative for Chronic Obstructive Lung Disease 2017 classification ([place]) 12/09/2020   Chronic pain of breast 11/12/2020   Compression fracture of body of thoracic vertebra ([place]) 11/06/2020   Drug withdrawal seizure with complication ([place]) 12/87/8676   SIADH (syndrome of inappropriate ADH production) ([place]) 10/12/2020   Hypomagnesemia    Protein-calorie malnutrition ([place]) 09/17/2020   Hyponatremia    Paroxysmal atrial fibrillation (HCC)    Shortness of breath    Alcohol withdrawal ([place]) 09/10/2020   Pulmonary nodules/lesions, multiple 07/14/2020   HTN (hypertension) 06/02/2018   Body mass index (BMI) 31.0-31.9, adult 03/29/2013   Diarrhea 10/20/2012   Anxiety disorder 06/12/2012   Alcohol use 05/28/2012   Benzodiazepine withdrawal without complication ([place]) 72/02/4708   Tobacco abuse 02/17/2012   GERD (gastroesophageal reflux disease) 02/04/2012   Depression 02/04/2012    REFERRING PROVIDER: [name], MD   REFERRING DIAG: Compression fracture of body of thoracic vertebra   THERAPY DIAG:  No diagnosis found.  ONSET DATE: approximately 5-6 months ago  PERTINENT HISTORY: Anxiety/depression, seizures, previous left shoulder injury limiting motion, thoracic compression fracture, osteoporosis  PRECAUTIONS: Fall  WEIGHT BEARING RESTRICTIONS: No   SUBJECTIVE: Patient arrived reporting increased foot  pain this visit due to neuropathy, denies any back pain at the moment. She also notes feeling more lightheaded today.   PAIN:  Are you having pain? Yes NPRS scale: 8/10 Pain location: Feet Pain orientation: Bilateral PAIN TYPE: Chronic Pain description:  Burning Aggravating factors: None reported Relieving factors: None reported  PATIENT GOALS: Pain relief   OBJECTIVE: (BOLDED MEASURES ASSESSED THIS VISIT) PATIENT SURVEYS:  FOTO 40% functional status   POSTURE:  Significant increase in thoracic kyphosis, rounded shoulder, forward head   UE AROM:   Left shoulder AROM limited due to previous injury   MMT Right 06/29/2021 Left 06/29/2021  Shoulder flexion 140 100  Shoulder extension 45 30  Shoulder abduction 120 90  Functional IR L5 Sacrum  Functional ER C7 Ear      LE MMT:   MMT Right 06/29/2021 Left 06/29/2021  Hip flexion 4- 4-  Hip extension 3 3  Hip abduction 3 3  Knee flexion 4 4  Knee extension 4 4  Ankle dorsiflexion 4 4  Ankle plantarflexion 3- 3-  Ankle inversion 4 4  Ankle eversion 4 4    UE MMT:   MMT Right 06/29/2021 Left 06/29/2021  Shoulder flexion 4 4-  Shoulder ER 4 4-  Shoulder abduction 4 4-  Periscap musculature 4- 4-    FUNCTIONAL TESTS:  Sit to stand: patient unable to perform without BUE support on arm rests, she is not willing to try standing without UE support (assessed 07/13/21)   Patient with increased sway during romberg stance, patient reports fear of falling and balance impairment (assessed 07/13/21)   GAIT: Assistive device utilized: Rolling walking Level of assistance: Modidied Independence Comments: Patient with much more stable gait while using RW, she does require cueing to avoid downward gaze and for posture, remaining close to walker to avoid forward lean    TODAY'S TREATMENT   07/27/2021: ***    07/20/2021: NuStep L5 x 5 min with UE/LE while taking subjective SLR 2 x 10 Bridge 2 x 10 with 3 sec hold Hooklying clamshell with green 2 x 15 Sidelying hip abduction x 10 each LAQ with 3# 2 x 20 each Seated alternating march with 3# 2 x 20 Seated hamstring curl with green 2 x 15 each Seated row with green 2x15  07/13/2021: NuStep L5 x 5 min with UE/LE while taking  subjective LAQ with 3# 2 x 20 each Seated alternating march with 3# 2 x 20 Seated heel toe raises with 3# 2 x 20 LTR 5 x 5 sec each Bridge 2 x 15 SLR 2 x 10 Hooklying clamshell with red 2 x 15 Seated row with red 2 x 20 Sit to stand 2 x 5 - using BUE support to stand, then lowering without UE support and focusing on slowly lowering to the chair with control Romberg stance at counter 4 x 30 sec - patient requires frequent UE support due to fear of falling   PATIENT EDUCATION:  Education details: HEP, continuing to use AD for mobility Person educated: Patient Education method: Explanation, Demonstration, Tactile cues, Verbal cues Education comprehension: verbalized understanding, returned demonstration, verbal cues required, tactile cues required, and needs further education   HOME EXERCISE PROGRAM: Access Code: [email or web address]     ASSESSMENT: CLINICAL IMPRESSION: Patient with fair tolerance for therapy this visit, no adverse effects reported. Patient reporting increased pain and lightheadedness this visit so therapy focused mainly on table based supine and seated strengthening to avoid exacerbating symptoms. She did arrive using RW with  much improve gait steadiness, but did require cueing to improve safety. She continues to demonstrate gross weakness of postural muscles, proxima hip and LE muscles. No changes made to HEP this visit. Patient would benefit from continued skilled PT to progress her mobility and strength in order to maximize her functional ability.      GOALS: Goals reviewed with patient? Yes   SHORT TERM GOALS:   STG Name Target Date Goal status  1 Patient will be I with initial HEP in order to progress with therapy. Baseline: continues to require cueing for exercises 07/27/2021 ONGOING  2 PT will review FOTO with patient by 3rd visit in order to understand expected progress and outcome with therapy. Baseline: reviewed 2nd visit 07/27/2021 ACHIEVED  3 Patient will report back  pain </= 3/10 in order to reduce functional limitations Baseline: patient reports improved back pain, but worsening foot pain 07/27/2021 ONGOING    LONG TERM GOALS:    LTG Name Target Date Goal status  1 Patient will be I with final HEP to maintain progress from PT. Baseline: provided at eval 08/24/2021 INITIAL  2 Patient will report >/= 54% status on FOTO to indicate improved functional ability. Baseline: 40% functional status 08/24/2021 INITIAL  3 Patient will demonstrate periscapular strength grossly 4/5 MMT to improve postural control and reduce back pain. Baseline: grossly 4-/5 MMT 08/24/2021 INITIAL  4 Patient will demonstrate left shoulder elevation >/= 120 deg to improve overhead reach and dressing. Baseline: 100 deg 08/24/2021 INITIAL      PLAN: PT FREQUENCY: 1x/week   PT DURATION: 8 weeks   PLANNED INTERVENTIONS: Therapeutic exercises, Therapeutic activity, Neuro Muscular re-education, Balance training, Gait training, Patient/Family education, Joint mobilization, Stair training, Aquatic Therapy, Dry Needling, Spinal mobilization, Cryotherapy, Moist heat, Taping, and Manual therapy   PLAN FOR NEXT SESSION: Review HEP and progress PRN, postural strengthening, balance training     Hilda Blades, PT, DPT, LAT, ATC 08/06/21  1:04 PM Phone: [phone] Fax: [phone]

## 2021-08-07 ENCOUNTER — Ambulatory Visit (INDEPENDENT_AMBULATORY_CARE_PROVIDER_SITE_OTHER): Payer: 59 | Admitting: Family Medicine

## 2021-08-07 ENCOUNTER — Other Ambulatory Visit: Payer: Self-pay

## 2021-08-07 ENCOUNTER — Encounter: Payer: Self-pay | Admitting: Family Medicine

## 2021-08-07 ENCOUNTER — Ambulatory Visit (HOSPITAL_COMMUNITY)
Admission: RE | Admit: 2021-08-07 | Discharge: 2021-08-07 | Disposition: A | Discharge status: Home / Self Care | Payer: 59 | Source: Ambulatory Visit | Attending: Family Medicine | Admitting: Family Medicine

## 2021-08-07 DIAGNOSIS — E871 Hypo-osmolality and hyponatremia: Secondary | ICD-10-CM | POA: Diagnosis not present

## 2021-08-07 DIAGNOSIS — Z789 Other specified health status: Secondary | ICD-10-CM | POA: Diagnosis not present

## 2021-08-07 DIAGNOSIS — G6289 Other specified polyneuropathies: Secondary | ICD-10-CM | POA: Diagnosis not present

## 2021-08-07 DIAGNOSIS — M25572 Pain in left ankle and joints of left foot: Secondary | ICD-10-CM | POA: Diagnosis not present

## 2021-08-07 DIAGNOSIS — M79672 Pain in left foot: Secondary | ICD-10-CM

## 2021-08-07 DIAGNOSIS — F419 Anxiety disorder, unspecified: Secondary | ICD-10-CM

## 2021-08-07 DIAGNOSIS — R69 Illness, unspecified: Secondary | ICD-10-CM | POA: Diagnosis not present

## 2021-08-07 MED ORDER — NORTRIPTYLINE HCL 25 MG PO CAPS: 25.0000 mg | ORAL_CAPSULE | Freq: Every day | ORAL | 2 refills | Status: DC

## 2021-08-07 MED ORDER — CLONAZEPAM 0.5 MG PO TABS: 0.2500 mg | ORAL_TABLET | Freq: Two times a day (BID) | ORAL | 0 refills | Status: DC | PRN

## 2021-08-07 NOTE — Assessment & Plan Note (Addendum)
-   All heavy metal and vitamin testing and syphilis testing has been normal to this point, will refer to neurology, will increase nortriptyline to 25 mg nightly as she may have received some benefit from this -Suspect this may be due to alcohol use versus idiopathic as all other work-up has been negative to this point

## 2021-08-07 NOTE — Assessment & Plan Note (Signed)
-   With ecchymosis and worsening pain concern for fracture, will get foot and ankle x-rays due to the diffuse sensitivity

## 2021-08-07 NOTE — Assessment & Plan Note (Signed)
Repeat lab work today

## 2021-08-07 NOTE — Assessment & Plan Note (Signed)
-   Discussed with patient and her Sister Jackelyn Poling in room that with her continued alcohol use it is unsafe for me to continue to prescribe benzodiazepines, discussed the best plan going forward would be to call psychiatry as I have already placed a referral multiple times and to get in with them to talk about other medications for her anxiety as she has been intolerant of SSRIs - We will refill Klonopin but only  For 0.25 mg twice daily or 0.5 mg daily, will give 1 month supply - Discussed that I will not refill this early and that if she is not taking it appropriately I will no longer prescribe benzodiazepines for her as this will now be a low enough taper for her to discontinue - Again encouraged therapy as I feel working on coping strategies could be helpful, she declines at this time -Reiterated the high risk including oversedation and death of taking benzodiazepines and drinking alcohol together and why is appropriate for me to taper off of these, reiterated however tried multiple times to refer to psychiatry as I feel like she has not been successful on many other medications

## 2021-08-07 NOTE — Assessment & Plan Note (Signed)
-   Discussed cessation as the best thing for her likely her diarrhea, electrolyte abnormalities, and peripheral neuropathy going forward

## 2021-08-07 NOTE — Assessment & Plan Note (Signed)
-   We will repeat CMP today

## 2021-08-07 NOTE — Patient Instructions (Addendum)
It was wonderful to see you today.  Please bring ALL of your medications with you to every visit.   Today we talked about:  We will recheck your lab work today.  You can call the [facility] [phone] to check on the status of your psychiatry referral.  I really think a psychiatrist would be best suited to help you with other medications.  I have refilled your Klonopin today, but at a lower dose of 0.25 mg, or one half tab, twice daily.  This means only 1 tab is written for the whole day.  If you take more than this, I will not be able to refill this.  Please take this medication as prescribed.  I have increased your nerve pain medication nortriptyline to 25 mg daily.  If you still have some 10 mg tabs you can take 210 mg tabs for 20 mg total daily until they are gone, and then start the 25 mg tab.  Please do not take these together.  I have placed an neurology referral so they should be calling you in the next 2 weeks.  You can go across the street to entrance a of the hospital and tell them that you need to go to radiology to get the x-rays of your foot.  I will call you with these results.   Thank you for choosing [facility].   Please call [phone] with any questions about today's appointment.  Please be sure to schedule follow up at the front  desk before you leave today.   Please arrive at least 15 minutes prior to your scheduled appointments.   If you had blood work today, I will send you a MyChart message or a letter if results are normal. Otherwise, I will give you a call.   If you had a referral placed, they will call you to set up an appointment. Please give [place] a call if you don't hear back in the next 2 weeks.   If you need additional refills before your next appointment, please call your pharmacy first.   Yehuda Savannah, MD  Family Medicine

## 2021-08-08 ENCOUNTER — Other Ambulatory Visit: Payer: Self-pay | Admitting: Family Medicine

## 2021-08-10 ENCOUNTER — Encounter: Payer: Self-pay | Admitting: Family Medicine

## 2021-08-10 ENCOUNTER — Telehealth: Payer: Self-pay

## 2021-08-10 ENCOUNTER — Ambulatory Visit: Payer: 59 | Admitting: Physical Therapy

## 2021-08-10 ENCOUNTER — Other Ambulatory Visit: Payer: Self-pay | Admitting: Gastroenterology

## 2021-08-10 NOTE — Telephone Encounter (Signed)
Patient call nurse line requesting results of foot and ankle X-ray from 2/24.  Advised patient that results are still pending and we will contact her once they are finalized.   Talbot Grumbling, RN

## 2021-08-10 NOTE — Telephone Encounter (Signed)
Patient returns call to nurse line regarding X-rays. Informed patient that I would send message provider to discuss.   Patient states "this is ridiculous, my foot hurts."   Will forward to PCP.   Talbot Grumbling, RN

## 2021-08-10 NOTE — Telephone Encounter (Signed)
Called her.  Left vm that I would send a mychart message about her xray If she wants to talk please call and leave times and a number

## 2021-08-11 MED ORDER — GABAPENTIN 600 MG PO TABS: 600.0000 mg | ORAL_TABLET | Freq: Three times a day (TID) | ORAL | 0 refills | Status: DC

## 2021-08-11 NOTE — Addendum Note (Signed)
Addended by: Talbot Grumbling on: 08/11/2021 05:16 PM   Modules accepted: Orders

## 2021-08-11 NOTE — Telephone Encounter (Signed)
Patient calls nurse line regarding issues with gabapentin rx. Per chart review, medication was approved on 2/22, however, was set to "no print" and never received by pharmacy.   Resent medication electronically. Receipt confirmed on 2/28 at 5:13 pm.   Talbot Grumbling, RN

## 2021-08-11 NOTE — Telephone Encounter (Signed)
Patient called and left two voice mails on nurse line regarding results.   I attempted to return call to patient. No answer, left VM for patient to return call to office.   Talbot Grumbling, RN

## 2021-08-12 ENCOUNTER — Encounter: Payer: Self-pay | Admitting: Neurology

## 2021-08-12 NOTE — Telephone Encounter (Signed)
Patient returns call to nurse line. Advised of results per mychart message from Dr. Erin Hearing.  ? ?Patient verbalizes understanding.  ? ?Talbot Grumbling, RN ? ?

## 2021-08-17 DIAGNOSIS — R0902 Hypoxemia: Secondary | ICD-10-CM | POA: Diagnosis not present

## 2021-08-17 DIAGNOSIS — R200 Anesthesia of skin: Secondary | ICD-10-CM | POA: Diagnosis not present

## 2021-08-21 ENCOUNTER — Other Ambulatory Visit: Payer: Self-pay | Admitting: Family Medicine

## 2021-08-21 DIAGNOSIS — K219 Gastro-esophageal reflux disease without esophagitis: Secondary | ICD-10-CM

## 2021-08-27 ENCOUNTER — Ambulatory Visit: Payer: Medicare Other | Admitting: Nurse Practitioner

## 2021-08-28 ENCOUNTER — Telehealth (INDEPENDENT_AMBULATORY_CARE_PROVIDER_SITE_OTHER): Payer: 59 | Admitting: Family Medicine

## 2021-08-28 ENCOUNTER — Telehealth: Payer: Self-pay

## 2021-08-28 DIAGNOSIS — F419 Anxiety disorder, unspecified: Secondary | ICD-10-CM | POA: Diagnosis not present

## 2021-08-28 DIAGNOSIS — R69 Illness, unspecified: Secondary | ICD-10-CM | POA: Diagnosis not present

## 2021-08-28 DIAGNOSIS — G6289 Other specified polyneuropathies: Secondary | ICD-10-CM

## 2021-08-28 DIAGNOSIS — F32A Depression, unspecified: Secondary | ICD-10-CM

## 2021-08-28 DIAGNOSIS — E860 Dehydration: Secondary | ICD-10-CM | POA: Insufficient documentation

## 2021-08-28 MED ORDER — NORTRIPTYLINE HCL 25 MG PO CAPS: 50.0000 mg | ORAL_CAPSULE | Freq: Every day | ORAL | 2 refills | Status: DC

## 2021-08-28 NOTE — Assessment & Plan Note (Signed)
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

## 2021-08-28 NOTE — Assessment & Plan Note (Signed)
-   Concern with poor PO intake, decreased urine output, continuous diarrhea and vomiting that Ms Ortego may be getting dehydrated again ?- with weakness, headache, and unable to ambulate I recommended she proceed to ED as she is prone to electrolyte abnormalities and I suspect she may need IVF and hospital admission ?- She is agreeable to this plan, notes she can find a ride or call 911 ?- made safety plan to call back at 1500 today if I do not see that she has gone to the ED by then ?

## 2021-08-28 NOTE — Telephone Encounter (Signed)
Patient calls nurse line regarding nortriptyline prescription. Patient states that she called pharmacy and that they did not receive new prescription from Dr. Thompson Grayer.  ? ?Called and spoke with pharmacist. Pharmacist reports that they did receive prescription, however, they are unable to fill due to patient having recently refilled medication on 3/11 (#60). This was sent in by patient's neurologist, Dr. Everette Rank.  ? ?Called patient back and informed that she had already picked up this medication and would be unable to get refill until April. Patient verbalizes understanding.  ? ?FYI to PCP.  ? ?Talbot Grumbling, RN ? ?

## 2021-08-28 NOTE — Assessment & Plan Note (Signed)
-   denies SI, agreeable to go to hospital ?

## 2021-08-28 NOTE — Progress Notes (Signed)
? ? ?  SUBJECTIVE:  ? ?CHIEF COMPLAINT / HPI:  ?[place] ?Telemedicine Visit ? ?Patient consented to have virtual visit and was identified by name and date of birth. ?Method of visit: Telephone ? ?Encounter participants: ?Patient: Karen Casey - located at home ?Provider: [name] - located at [facility] ?Others (if applicable): n/a ? ? ?Migraine- several days having headache, tylenol 2 tabs '500mg'$  taken today. Doesn't help. No vision changes. Is having vomiting with the headaches "I have this all the time." Trouble keeping fluids down.  ? ?Nausea and vomiting- tried to eat ham and cheese sandwich yesterday, only able to eat half and then vomiting. Drinking gatorade, 2 bottles yesterday. Only urinated a few times since yesterday. Getting dizziness when she tries to stand up.  ? ?Diarrhea- still having multiple loose stools, more than she can count.  ? ?Peripheral neuropathy- increased nortryptiline '50mg'$  at neurologist, but sent to wrong pharmacy so she hasn't picked it up. Has not been walking all week besides to go to the bathroom.  ? ?Depression- feeling down and hopeless, denies SI or plan or intent / PHQ-9  was 21 today.  ? ?Alcohol- no use since last visit. Notes she has felt too sick to drink anything but gatorade. ? ?Anxiety- Took a Klonopin last today. Taking two per day, notes that she couldn't cut them in half and they broke into pieces and she couldn't take. "We need to do something different about that. I'm not going to behavioral health."  ? ?PERTINENT  PMH / PSH: GAD, HTN, alcohol use disorder, COPD gold stage B, GERD, hepatic steatosis, peripheral neuropathy, thoracic compression fracture, osteoporosis, tobacco use disorder ? ?OBJECTIVE:  ? ?There were no vitals taken for this visit.  ?General: speaking in complete sentences ?Psych: anxious, tearful, flat affect ? ?ASSESSMENT/PLAN:  ? ?Dehydration ?- Concern with poor PO intake, decreased urine output, continuous diarrhea  and vomiting that Ms Verdell may be getting dehydrated again ?- with weakness, headache, and unable to ambulate I recommended she proceed to ED as she is prone to electrolyte abnormalities and I suspect she may need IVF and hospital admission ?- She is agreeable to this plan, notes she can find a ride or call 911 ?- made safety plan to call back at 1500 today if I do not see that she has gone to the ED by then ? 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 ?Depression ?- denies SI, agreeable to go to hospital ?  ? ?Time spent during visit with patient: 12 minutes ? ?[name], MD ?[place]  ? ?

## 2021-09-03 ENCOUNTER — Telehealth: Payer: Self-pay | Admitting: Family Medicine

## 2021-09-03 DIAGNOSIS — F419 Anxiety disorder, unspecified: Secondary | ICD-10-CM

## 2021-09-03 MED ORDER — CLONAZEPAM 0.5 MG PO TABS: 0.5000 mg | ORAL_TABLET | Freq: Every day | ORAL | 0 refills | Status: DC | PRN

## 2021-09-03 NOTE — Telephone Encounter (Signed)
Called and left VM for patient x1. ? ?She did not go to ED after virtual visit last week. Concerned at that time that she was getting dehydrated and unable to keep fluids down. Did speak on Friday afternoon with our nurses but had still not gone.  ? ?Called second time and she picked up. She states she didn't go to hospital because she has started feeling better. She is wondering "when my nerve medicine can be refilled." She notes she last took a dose on Tuesday, "I have been taking 1-2 per day." She notes she gets nervous that she has run out. She is still having nausea, notes her diarrhea is about the same but she is drinking gatorade. ? ?I discussed I recommended going to ED or hospital as she may be dehydrated or have electrolyte imbalances that need to be treated. She states she does not have a ride and could call 911 but "I know you all won't do anything for me there anyways." ? ?I asked if she would be willing to come to clinic for appt to see her and check lab work. She states "I could come in but I don't want to." She notes it is hard for her to leave the house because she is so anxious. ? ?I again reiterated risk of serious illness including death or seizures if she has electrolyte abnormalities and cannot come in to see provider- she had refused lab draws at her last in person appointment and last week was virtual because she had a headache and trouble getting out of bed.  ? ?I discussed that she was not taking the Klonopin as prescribed and this was against what we had previously discussed, and that I was going to have to prescribe in shorter periods now to ensure safe medication dosing. She is prescribed 0.5 mg once daily as previously she stated unable to cut the 0.25 mg in half. "You are going to give me less at a time, that isn't even worth it." ? ?Conversation ended with her hanging up on me before I could discuss that I am giving her same daily amount just in small time increments (1 week at a  time) to prevent taking more than prescribed. ? ?Called back at 1102 - no answer, left VM with clinic number to call back. On VM discussed that she is due for refill tomorrow and I will continue to prescribe the 0.'5mg'$  daily Klonopin but have other medication options that I want to discuss with her to try to help with her anxiety. Left clinic number to call back to discuss these. Considering Trintellix and buspar potentially.  Will send refill today for 1 week klonopin 0.5 mg daily to avoid withdrawal seizure and attempt to meet Ms Engebretsen half way. I am concerned that we have been unable to trial medications due to tolerance that would be more helpful long term for her anxiety and with inability to see her in person and draw labs and check electrolytes that it would be unsafe to give larger script for benzodiazepines at this time. ? ?If she calls back: ?- please see if she will go to ED or schedule appt ASAP in clinic ?- remind her I have sent in one week of Klonopin 0.'5mg'$  once daily, she is only to take one tab per day ? ?Yehuda Savannah MD ?

## 2021-09-04 NOTE — Telephone Encounter (Signed)
Patient returns call to nurse line.  ? ?Patient requested refills for Gabapentin and Clonazepam. I advised patient Clonazepam went sent in yesterday and she stated, "I know and only for one week."  ? ?Patient advised she is not due for a Gabapentin refill yet.  ? ?Advised patient it would be best to make an apt to discuss. Apt made for 3/29. ? ? ?

## 2021-09-08 NOTE — Progress Notes (Deleted)
? ? ?  SUBJECTIVE:   CHIEF COMPLAINT / HPI:   ***  PERTINENT  PMH / PSH: ***  OBJECTIVE:   There were no vitals taken for this visit.  ***  ASSESSMENT/PLAN:   No problem-specific Assessment & Plan notes found for this encounter.     Shelbylynn Walczyk E Kashay Cavenaugh, MD [facility]  

## 2021-09-09 ENCOUNTER — Other Ambulatory Visit: Payer: Self-pay

## 2021-09-09 ENCOUNTER — Telehealth: Payer: 59 | Admitting: Family Medicine

## 2021-09-09 ENCOUNTER — Telehealth: Payer: Self-pay | Admitting: *Deleted

## 2021-09-09 NOTE — Telephone Encounter (Signed)
Contacted pt to  do her previsit before connecting with Dr. Thompson Grayer and she said that she would need to cancel today due to not feeling well and I told her to call our office to reschedule whenever she felt better.Karen Casey, CMA ? ?

## 2021-09-16 ENCOUNTER — Other Ambulatory Visit: Payer: Self-pay

## 2021-09-16 DIAGNOSIS — I10 Essential (primary) hypertension: Secondary | ICD-10-CM

## 2021-09-16 DIAGNOSIS — F419 Anxiety disorder, unspecified: Secondary | ICD-10-CM

## 2021-09-16 MED ORDER — CLONAZEPAM 0.5 MG PO TABS: 0.5000 mg | ORAL_TABLET | Freq: Every day | ORAL | 0 refills | Status: DC | PRN

## 2021-09-16 MED ORDER — ESOMEPRAZOLE MAGNESIUM 40 MG PO CPDR: DELAYED_RELEASE_CAPSULE | ORAL | 1 refills | Status: DC

## 2021-09-16 MED ORDER — AMLODIPINE BESYLATE 5 MG PO TABS: 5.0000 mg | ORAL_TABLET | Freq: Every day | ORAL | 1 refills | Status: DC

## 2021-09-16 NOTE — Telephone Encounter (Signed)
Will do 2 week script, but needs to keep appt on 4/12 to discuss further refills, thanks! ? ?Dr Thompson Grayer ?

## 2021-09-16 NOTE — Telephone Encounter (Signed)
Patient calls nurse line requesting a refill on Clonazepam.  ? ?Patient reports its not cost effective for continue paying for a 7 day supply.  ? ?Patient rescheduled for 4/12 with PCP. Patient reports she may need to switch to virtual depending on transportation. Patient advised to call ahead of time.  ?

## 2021-09-17 DIAGNOSIS — R0902 Hypoxemia: Secondary | ICD-10-CM | POA: Diagnosis not present

## 2021-09-23 ENCOUNTER — Telehealth: Payer: Self-pay

## 2021-09-23 ENCOUNTER — Encounter (HOSPITAL_COMMUNITY): Payer: Self-pay | Admitting: *Deleted

## 2021-09-23 ENCOUNTER — Emergency Department (HOSPITAL_COMMUNITY)
Admission: EM | Admit: 2021-09-23 | Discharge: 2021-09-23 | Disposition: A | Discharge status: Home / Self Care | Payer: 59 | Attending: Emergency Medicine | Admitting: Emergency Medicine

## 2021-09-23 ENCOUNTER — Ambulatory Visit (INDEPENDENT_AMBULATORY_CARE_PROVIDER_SITE_OTHER): Payer: 59 | Admitting: Family Medicine

## 2021-09-23 ENCOUNTER — Encounter: Payer: Self-pay | Admitting: Family Medicine

## 2021-09-23 ENCOUNTER — Other Ambulatory Visit: Payer: Self-pay

## 2021-09-23 VITALS — BP 104/60 | HR 88 | Wt 150.8 lb

## 2021-09-23 DIAGNOSIS — M810 Age-related osteoporosis without current pathological fracture: Secondary | ICD-10-CM | POA: Insufficient documentation

## 2021-09-23 DIAGNOSIS — R627 Adult failure to thrive: Secondary | ICD-10-CM | POA: Diagnosis not present

## 2021-09-23 DIAGNOSIS — R110 Nausea: Secondary | ICD-10-CM

## 2021-09-23 DIAGNOSIS — E46 Unspecified protein-calorie malnutrition: Secondary | ICD-10-CM

## 2021-09-23 DIAGNOSIS — R918 Other nonspecific abnormal finding of lung field: Secondary | ICD-10-CM

## 2021-09-23 DIAGNOSIS — R531 Weakness: Secondary | ICD-10-CM | POA: Diagnosis not present

## 2021-09-23 DIAGNOSIS — Z5321 Procedure and treatment not carried out due to patient leaving prior to being seen by health care provider: Secondary | ICD-10-CM | POA: Insufficient documentation

## 2021-09-23 DIAGNOSIS — M8000XD Age-related osteoporosis with current pathological fracture, unspecified site, subsequent encounter for fracture with routine healing: Secondary | ICD-10-CM

## 2021-09-23 DIAGNOSIS — R231 Pallor: Secondary | ICD-10-CM | POA: Diagnosis not present

## 2021-09-23 LAB — COMPREHENSIVE METABOLIC PANEL
ALT: 47 U/L — ABNORMAL HIGH (ref 0–44)
AST: 78 U/L — ABNORMAL HIGH (ref 15–41)
Albumin: 2.5 g/dL — ABNORMAL LOW (ref 3.5–5.0)
Alkaline Phosphatase: 133 U/L — ABNORMAL HIGH (ref 38–126)
Anion gap: 12 (ref 5–15)
BUN: <5 mg/dL — ABNORMAL LOW (ref 8–23)
CO2: 28 mmol/L (ref 22–32)
Calcium: 8.4 mg/dL — ABNORMAL LOW (ref 8.9–10.3)
Chloride: 93 mmol/L — ABNORMAL LOW (ref 98–111)
Creatinine, Ser: 0.79 mg/dL (ref 0.44–1.00)
GFR, Estimated: >60 mL/min (ref >60)
Glucose, Bld: 92 mg/dL (ref 70–99)
Potassium: 3.3 mmol/L — ABNORMAL LOW (ref 3.5–5.1)
Sodium: 133 mmol/L — ABNORMAL LOW (ref 135–145)
Total Bilirubin: 0.9 mg/dL (ref 0.3–1.2)
Total Protein: 5.3 g/dL — ABNORMAL LOW (ref 6.5–8.1)

## 2021-09-23 LAB — CBC WITH DIFFERENTIAL/PLATELET
Abs Immature Granulocytes: 0.05 10*3/uL (ref 0.00–0.07)
Basophils Absolute: 0 10*3/uL (ref 0.0–0.1)
Basophils Relative: 0 %
Eosinophils Absolute: 0.1 10*3/uL (ref 0.0–0.5)
Eosinophils Relative: 1 %
HCT: 40.3 % (ref 36.0–46.0)
Hemoglobin: 14.2 g/dL (ref 12.0–15.0)
Immature Granulocytes: 1 %
Lymphocytes Relative: 25 %
Lymphs Abs: 2.2 10*3/uL (ref 0.7–4.0)
MCH: 33 pg (ref 26.0–34.0)
MCHC: 35.2 g/dL (ref 30.0–36.0)
MCV: 93.7 fL (ref 80.0–100.0)
Monocytes Absolute: 0.8 10*3/uL (ref 0.1–1.0)
Monocytes Relative: 9 %
Neutro Abs: 5.8 10*3/uL (ref 1.7–7.7)
Neutrophils Relative %: 64 %
Platelets: 255 10*3/uL (ref 150–400)
RBC: 4.3 MIL/uL (ref 3.87–5.11)
RDW: 14.5 % (ref 11.5–15.5)
WBC: 9 10*3/uL (ref 4.0–10.5)
nRBC: 0 % (ref 0.0–0.2)

## 2021-09-23 LAB — PHOSPHORUS: Phosphorus: 2.5 mg/dL (ref 2.5–4.6)

## 2021-09-23 LAB — MAGNESIUM: Magnesium: 1.2 mg/dL — ABNORMAL LOW (ref 1.7–2.4)

## 2021-09-23 LAB — LIPASE, BLOOD: Lipase: 26 U/L (ref 11–51)

## 2021-09-23 LAB — POCT HEMOGLOBIN: Hemoglobin: 15.8 g/dL — AB (ref 11–14.6)

## 2021-09-23 MED ORDER — PROMETHAZINE HCL 12.5 MG PO TABS: 12.5000 mg | ORAL_TABLET | Freq: Three times a day (TID) | ORAL | 0 refills | Status: DC | PRN

## 2021-09-23 MED ORDER — ALBUTEROL SULFATE HFA 108 (90 BASE) MCG/ACT IN AERS: 2.0000 | INHALATION_SPRAY | Freq: Four times a day (QID) | RESPIRATORY_TRACT | 2 refills | Status: DC | PRN

## 2021-09-23 NOTE — Telephone Encounter (Signed)
Called patient. She discussed she left ED bc wait time. Wanting to discuss lab work. She did not get any IVF. Mag low 1.2 and K 3.3. She is willing to take a potassium tab if I send it in. Discussed other labs okay. She is fearful and tearful and wants to talk more tomorrow. No bowel or bladder incontinence. No fevers or chills. Notes thoughts of wanting to not be here sometimes but denies plan or intent. She is worried that she will no t be able to get up and walk again. She wants to discuss this more tomorrow. She does not want to go to hospital. Also notes she was able to eat some today. No vomiting. Still having multiple loose stools daily. She is adamant does not want to go to nursing home. We discussed will call and talk about other options tomorrow as she is tearful and wants to go now. ? ?Yehuda Savannah MD

## 2021-09-23 NOTE — Telephone Encounter (Signed)
Patient LVM on nurse line informing PCP she is at the hospital and would like for her to call her.  ? ?Will forward to PCP.  ?

## 2021-09-23 NOTE — Progress Notes (Signed)
? ? ?  SUBJECTIVE:  ? ?CHIEF COMPLAINT / HPI:  ? ?Peripheral neuropathy- has been taking nortryptiline 75 mg daily. Needs refill. Still hurting and can't feel the difference between hot and cold. Stocking and glove distribution. ? ?Depression/Anxiety- currently having Klonopin 0.5 mg daily. Has 10 left. Taking Seroquel '100mg'$  qHS. Has thoughts of being better of not here. ? ?Alcohol use disorder- denies drinking. ? ?Weight loss- she is 10 lbs down from February office visit. Trouble swallowing solids. Sister notes she is not eating as well. Notes she is too weak to get up off of toilet. ? ?PERTINENT  PMH / PSH: HTN, paroxysmal Afib, pulmonary nodules, COPD gold B, GERD, anxiety and depression, tobacco use disorder, alcohol use disorder, thoracic compression fracture with osteoporosis, electrolyte abnormalities ? ?OBJECTIVE:  ? ?BP 104/60   Pulse 88   Wt 150 lb 12.8 oz (68.4 kg)   SpO2 96%   BMI 26.71 kg/m?   ?General: A&O, appear pale, in wheelchair, frail ?HEENT: No sign of trauma, EOM grossly intact ?Cardiac: RRR, no m/r/g ?Respiratory: CTAB, normal WOB, no w/c/r ?GI: Soft, NTTP, non-distended  ?Extremities: NTTP, no peripheral edema. Strength 4/5 in bilateral lower extremities, sensation intact bilateral upper and lower extremities ?Neuro: in wheelchair, unable to get out of wheelchair on her own without assistance ?Psych: tearful ? ?ASSESSMENT/PLAN:  ? ?Failure to thrive in adult ?Karen Casey is the worst I've seen her weeks. She has generalized weakness, 10 lb weight loss since last office visit, and appears pale. POC Hgb in office elevated, concern for dehydration. She is unable to get up herself. Discussed with her and her sister that I think she needs hospital admission for rehydration, electrolytes, and therapy. I discussed she may need to go to SNF which she refuses. But she is open to going to hospital. I do think a psychiatry consult inpatient is warranted as her depression and anxiety and inability to  titrate medications in the outpatient setting is significantly contributing to failure to thrive. Recommended ED evaluation and her sister will escort her for lab work and suspect admission warranted. ?  ? ? ?[name], MD ?[place]  ? ?

## 2021-09-23 NOTE — Patient Instructions (Signed)
It was wonderful to see you today. ? ?Please bring ALL of your medications with you to every visit.  ? ?Today we talked about: ? ?Please go to the ED. I will let them know youre coming. ? ? ?Thank you for choosing [facility].  ? ?Please call [phone] with any questions about today's appointment. ? ?Please be sure to schedule follow up at the front  desk before you leave today.  ? ?Please arrive at least 15 minutes prior to your scheduled appointments. ?  ?If you had blood work today, I will send you a MyChart message or a letter if results are normal. Otherwise, I will give you a call. ?  ?If you had a referral placed, they will call you to set up an appointment. Please give [place] a call if you don't hear back in the next 2 weeks. ?  ?If you need additional refills before your next appointment, please call your pharmacy first.  ? ?Yehuda Savannah, MD  ?Family Medicine   ?

## 2021-09-23 NOTE — ED Notes (Signed)
The patient informed this EMT that she wanted to leave and go be seen at another facility ?

## 2021-09-23 NOTE — ED Provider Triage Note (Signed)
Emergency Medicine Provider Triage Evaluation Note ? ?Karen Casey , a 66 y.o. female  was evaluated in triage.  Pt complains of weakness. ? ?Review of Systems  ?Positive: weakness ?Negative: Fever, cough, sob ? ?Physical Exam  ?BP 97/66 (BP Location: Right Arm)   Pulse (!) 107   Temp 98.4 ?F (36.9 ?C) (Oral)   Resp 17   SpO2 100%  ?Gen:   Awake, no distress   ?Resp:  Normal effort  ?MSK:   Moves extremities without difficulty  ?Other:   ? ?Medical Decision Making  ?Medically screening exam initiated at 1:28 PM.  Appropriate orders placed.  Karen Casey was informed that the remainder of the evaluation will be completed by another provider, this initial triage assessment does not replace that evaluation, and the importance of remaining in the ED until their evaluation is complete. ? ?Had blood work done at PCP today and was told to come to the ER due to ?high iron ?  ?Domenic Moras, PA-C ?09/23/21 1333 ? ?

## 2021-09-23 NOTE — ED Triage Notes (Signed)
Pt reports being sent here for abnormal lab. Her only complaint is generalized fatigue. No distress noted at triage. ?

## 2021-09-23 NOTE — Assessment & Plan Note (Signed)
Karen Casey is the worst I've seen her weeks. She has generalized weakness, 10 lb weight loss since last office visit, and appears pale. POC Hgb in office elevated, concern for dehydration. She is unable to get up herself. Discussed with her and her sister that I think she needs hospital admission for rehydration, electrolytes, and therapy. I discussed she may need to go to SNF which she refuses. But she is open to going to hospital. I do think a psychiatry consult inpatient is warranted as her depression and anxiety and inability to titrate medications in the outpatient setting is significantly contributing to failure to thrive. Recommended ED evaluation and her sister will escort her for lab work and suspect admission warranted. ?

## 2021-09-24 ENCOUNTER — Ambulatory Visit: Payer: Medicare Other | Admitting: Orthopedic Surgery

## 2021-09-25 MED ORDER — POTASSIUM CHLORIDE CRYS ER 20 MEQ PO TBCR: 40.0000 meq | EXTENDED_RELEASE_TABLET | Freq: Once | ORAL | 0 refills | Status: DC

## 2021-09-25 NOTE — Telephone Encounter (Signed)
Called patient, no answer, and left VM. Discussed that I am concerned about her inability to walk, it may be due to weakness or back abnormality, and that I recommend she go back to ED to be evaluated. I discussed that she may need further back imaging and that I suspect she needs SNF or rehab to get her strength back. ?Reminded her that I sent that potassium pill to take one time today. ?Also that I want her to stop taking home amlodipine as her BP were low in the office. ? Again reiterated that we can discuss home health but for now I recommend evaluation at the hospital. ? ?Yehuda Savannah MD ?

## 2021-09-25 NOTE — Telephone Encounter (Signed)
Patient calls nurse line requesting to speak with PCP.  ? ?Patient reports she is fearful she is going to lose her ability to walk. Patient is very adamant about SNF placement and patient had a bad experience at outpatient rehab.  ? ?Patient would like to explore home health options.  ? ?Will forward to PCP.  ?

## 2021-09-30 ENCOUNTER — Encounter: Payer: Self-pay | Admitting: Orthopedic Surgery

## 2021-09-30 ENCOUNTER — Other Ambulatory Visit: Payer: Self-pay | Admitting: Family Medicine

## 2021-09-30 DIAGNOSIS — F419 Anxiety disorder, unspecified: Secondary | ICD-10-CM

## 2021-10-01 ENCOUNTER — Encounter: Payer: Medicare Other | Admitting: Orthopedic Surgery

## 2021-10-02 NOTE — Progress Notes (Signed)
This encounter was created in error - please disregard.

## 2021-10-13 ENCOUNTER — Other Ambulatory Visit: Payer: Self-pay | Admitting: Family Medicine

## 2021-10-13 DIAGNOSIS — F419 Anxiety disorder, unspecified: Secondary | ICD-10-CM

## 2021-10-14 ENCOUNTER — Other Ambulatory Visit: Payer: Self-pay

## 2021-10-14 ENCOUNTER — Emergency Department (HOSPITAL_COMMUNITY)
Admission: EM | Admit: 2021-10-14 | Discharge: 2021-10-14 | Disposition: A | Discharge status: Home / Self Care | Payer: 59 | Attending: Emergency Medicine | Admitting: Emergency Medicine

## 2021-10-14 DIAGNOSIS — R112 Nausea with vomiting, unspecified: Secondary | ICD-10-CM | POA: Diagnosis not present

## 2021-10-14 DIAGNOSIS — Z743 Need for continuous supervision: Secondary | ICD-10-CM | POA: Diagnosis not present

## 2021-10-14 DIAGNOSIS — R197 Diarrhea, unspecified: Secondary | ICD-10-CM | POA: Diagnosis not present

## 2021-10-14 DIAGNOSIS — E876 Hypokalemia: Secondary | ICD-10-CM | POA: Insufficient documentation

## 2021-10-14 DIAGNOSIS — R1084 Generalized abdominal pain: Secondary | ICD-10-CM | POA: Diagnosis not present

## 2021-10-14 LAB — LIPASE, BLOOD: Lipase: 25 U/L (ref 11–51)

## 2021-10-14 LAB — CBC WITH DIFFERENTIAL/PLATELET
Abs Immature Granulocytes: 0.03 10*3/uL (ref 0.00–0.07)
Basophils Absolute: 0 10*3/uL (ref 0.0–0.1)
Basophils Relative: 1 %
Eosinophils Absolute: 0.1 10*3/uL (ref 0.0–0.5)
Eosinophils Relative: 2 %
HCT: 38.5 % (ref 36.0–46.0)
Hemoglobin: 13.8 g/dL (ref 12.0–15.0)
Immature Granulocytes: 0 %
Lymphocytes Relative: 19 %
Lymphs Abs: 1.7 10*3/uL (ref 0.7–4.0)
MCH: 33.3 pg (ref 26.0–34.0)
MCHC: 35.8 g/dL (ref 30.0–36.0)
MCV: 93 fL (ref 80.0–100.0)
Monocytes Absolute: 0.9 10*3/uL (ref 0.1–1.0)
Monocytes Relative: 10 %
Neutro Abs: 5.9 10*3/uL (ref 1.7–7.7)
Neutrophils Relative %: 68 %
Platelets: 224 10*3/uL (ref 150–400)
RBC: 4.14 MIL/uL (ref 3.87–5.11)
RDW: 13.4 % (ref 11.5–15.5)
WBC: 8.6 10*3/uL (ref 4.0–10.5)
nRBC: 0 % (ref 0.0–0.2)

## 2021-10-14 LAB — COMPREHENSIVE METABOLIC PANEL
ALT: 32 U/L (ref 0–44)
AST: 67 U/L — ABNORMAL HIGH (ref 15–41)
Albumin: 2.4 g/dL — ABNORMAL LOW (ref 3.5–5.0)
Alkaline Phosphatase: 113 U/L (ref 38–126)
Anion gap: 10 (ref 5–15)
BUN: <5 mg/dL — ABNORMAL LOW (ref 8–23)
CO2: 31 mmol/L (ref 22–32)
Calcium: 7.9 mg/dL — ABNORMAL LOW (ref 8.9–10.3)
Chloride: 93 mmol/L — ABNORMAL LOW (ref 98–111)
Creatinine, Ser: 0.57 mg/dL (ref 0.44–1.00)
GFR, Estimated: >60 mL/min (ref >60)
Glucose, Bld: 113 mg/dL — ABNORMAL HIGH (ref 70–99)
Potassium: 2.8 mmol/L — ABNORMAL LOW (ref 3.5–5.1)
Sodium: 134 mmol/L — ABNORMAL LOW (ref 135–145)
Total Bilirubin: 0.9 mg/dL (ref 0.3–1.2)
Total Protein: 5.3 g/dL — ABNORMAL LOW (ref 6.5–8.1)

## 2021-10-14 LAB — BASIC METABOLIC PANEL
Anion gap: 8 (ref 5–15)
BUN: <5 mg/dL — ABNORMAL LOW (ref 8–23)
CO2: 29 mmol/L (ref 22–32)
Calcium: 7.5 mg/dL — ABNORMAL LOW (ref 8.9–10.3)
Chloride: 98 mmol/L (ref 98–111)
Creatinine, Ser: 0.61 mg/dL (ref 0.44–1.00)
GFR, Estimated: >60 mL/min (ref >60)
Glucose, Bld: 116 mg/dL — ABNORMAL HIGH (ref 70–99)
Potassium: 3.2 mmol/L — ABNORMAL LOW (ref 3.5–5.1)
Sodium: 135 mmol/L (ref 135–145)

## 2021-10-14 LAB — MAGNESIUM
Magnesium: 0.8 mg/dL — CL (ref 1.7–2.4)
Magnesium: 1.8 mg/dL (ref 1.7–2.4)

## 2021-10-14 MED ORDER — POTASSIUM CHLORIDE CRYS ER 20 MEQ PO TBCR
60.0000 meq | EXTENDED_RELEASE_TABLET | Freq: Once | ORAL | Status: AC
  Administered 2021-10-14: 60 meq via ORAL
  Filled 2021-10-14: qty 3

## 2021-10-14 MED ORDER — LACTATED RINGERS IV BOLUS
2000.0000 mL | Freq: Once | INTRAVENOUS | Status: AC
  Administered 2021-10-14: 2000 mL via INTRAVENOUS

## 2021-10-14 MED ORDER — METOCLOPRAMIDE HCL 5 MG/ML IJ SOLN
5.0000 mg | Freq: Once | INTRAMUSCULAR | Status: AC
  Administered 2021-10-14: 5 mg via INTRAVENOUS
  Filled 2021-10-14: qty 2

## 2021-10-14 MED ORDER — METOCLOPRAMIDE HCL 10 MG PO TABS: 10.0000 mg | ORAL_TABLET | Freq: Four times a day (QID) | ORAL | 0 refills | Status: DC | PRN

## 2021-10-14 MED ORDER — MAGNESIUM SULFATE 2 GM/50ML IV SOLN
2.0000 g | Freq: Once | INTRAVENOUS | Status: AC
  Administered 2021-10-14: 2 g via INTRAVENOUS
  Filled 2021-10-14: qty 50

## 2021-10-14 MED ORDER — POTASSIUM CHLORIDE CRYS ER 20 MEQ PO TBCR: 20.0000 meq | EXTENDED_RELEASE_TABLET | Freq: Two times a day (BID) | ORAL | 0 refills | Status: DC

## 2021-10-14 MED ORDER — LACTATED RINGERS IV SOLN: INTRAVENOUS | Status: DC

## 2021-10-14 MED ORDER — LORAZEPAM 1 MG PO TABS
1.0000 mg | ORAL_TABLET | Freq: Once | ORAL | Status: AC
Start: 2021-10-14 — End: 2021-10-14
  Administered 2021-10-14: 1 mg via ORAL
  Filled 2021-10-14: qty 1

## 2021-10-14 MED ORDER — POTASSIUM CHLORIDE 10 MEQ/100ML IV SOLN
10.0000 meq | Freq: Once | INTRAVENOUS | Status: AC
  Administered 2021-10-14: 10 meq via INTRAVENOUS
  Filled 2021-10-14: qty 100

## 2021-10-14 NOTE — ED Provider Notes (Signed)
?[facility] DEPT ?Provider Note ? ? ?CSN: [id number] ?Arrival date & time: 10/14/21  0950 ? ?  ? ?History ? ?No chief complaint on file. ? ? ?KERENSA NICKLAS is a 66 y.o. female. ? ?66 year old female who presents with going nausea, vomiting, diarrhea times several days.  States that she has a history of reflux and this is etiology.  Denies any bloody stools.  Denies any hematemesis.  No fever or chills.  No associated abdominal discomfort.  2 weeks ago was seen by her doctor for weakness and was found to be hypokalemic and hypomagnesemic.  Was sent to the ED for further evaluation.  Patient did not stay to be seen by provider.  Her physician according to the medical records prescribed to her oral supplementation for her potassium and magnesium.  States that her weakness has improved.  States that she is drinking limited liquids at this time.  Not taking any new medications currently ? ? ?  ? ?Home Medications ?Prior to Admission medications   ?Medication Sig Start Date End Date Taking? Authorizing Provider  ?acetaminophen (TYLENOL) 325 MG tablet Take 650 mg by mouth every 6 (six) hours as needed for mild pain or headache.    [provider]  ?albuterol (VENTOLIN HFA) 108 (90 Base) MCG/ACT inhaler Inhale 2 puffs into the lungs every 6 (six) hours as needed for wheezing or shortness of breath. 09/23/21   [name], MD  ?amLODipine (NORVASC) 5 MG tablet Take 1 tablet (5 mg total) by mouth daily. 09/16/21 01/14/22  [name], MD  ?Celedonio Miyamoto 62.5-25 MCG/ACT AEPB INHALE 1 PUFF BY MOUTH EVERY DAY ?Patient taking differently: Inhale 1 puff into the lungs daily. 06/09/21   Martyn Malay, MD  ?CALCIUM-MAGNESIUM-ZINC PO Take 1 tablet by mouth daily.    [provider]  ?clonazePAM (KLONOPIN) 0.5 MG tablet TAKE 1 TABLET (0.5 MG TOTAL) BY MOUTH DAILY AS NEEDED FOR UP TO 14 DAYS FOR ANXIETY. 09/30/21 10/14/21  [name], MD  ?esomeprazole (NEXIUM) 40 MG capsule  TAKE 1 CAPSULE BY MOUTH EVERY DAY AT NOON ?Strength: 40 mg 09/16/21   [name], MD  ?feeding supplement (ENSURE ENLIVE / ENSURE PLUS) LIQD Take 237 mLs by mouth 2 (two) times daily between meals. 01/06/21   Alen Bleacher, MD  ?gabapentin (NEURONTIN) 600 MG tablet Take 1 tablet (600 mg total) by mouth 3 (three) times daily. 08/11/21   [name], MD  ?Multiple Vitamins-Minerals (CENTRUM SILVER 50+WOMEN PO) Take 1 tablet by mouth daily.    [provider]  ?nicotine (NICODERM CQ - DOSED IN MG/24 HOURS) 21 mg/24hr patch Place 1 patch (21 mg total) onto the skin daily. 01/08/21   Zenia Resides, MD  ?NON FORMULARY O2 use as needed    [provider]  ?nortriptyline (PAMELOR) 25 MG capsule Take 2 capsules (50 mg total) by mouth at bedtime. 08/28/21 09/27/21  [name], MD  ?ondansetron (ZOFRAN-ODT) 8 MG disintegrating tablet TAKE 1 TABLET BY MOUTH EVERY 8 HOURS AS NEEDED FOR NAUSEA OR VOMITING. 08/21/21   Martyn Malay, MD  ?potassium chloride (KLOR-CON) 10 MEQ tablet Take 1 tablet (10 mEq total) by mouth daily for 30 doses. 05/22/21 06/21/21  Wyvonnia Dusky, MD  ?potassium chloride SA (KLOR-CON M) 20 MEQ tablet Take 2 tablets (40 mEq total) by mouth once for 1 dose. 09/25/21 09/25/21  [name], MD  ?promethazine (PHENERGAN) 12.5 MG suppository Place 12.5 mg rectally  every 4 (four) hours as needed for nausea/vomiting. 01/26/21   [provider]  ?promethazine (PHENERGAN) 12.5 MG tablet Take 1 tablet (12.5 mg total) by mouth every 8 (eight) hours as needed for nausea or vomiting. 09/23/21   [name], MD  ?QUEtiapine (SEROQUEL) 25 MG tablet Take 3 tablets (75 mg total) by mouth at bedtime. 07/29/21   Erskine Emery, MD  ?   ? ?Allergies    ?Capsaicin-menthol and Diclo gel [diclofenac sodium]   ? ?Review of Systems   ?Review of Systems  ?All other systems reviewed and are negative. ? ?Physical Exam ?Updated Vital Signs ?There were no vitals taken for this  visit. ?Physical Exam ?Vitals and nursing note reviewed.  ?Constitutional:   ?   General: She is not in acute distress. ?   Appearance: Normal appearance. She is well-developed. She is not toxic-appearing.  ?HENT:  ?   Head: Normocephalic and atraumatic.  ?Eyes:  ?   General: Lids are normal.  ?   Conjunctiva/sclera: Conjunctivae normal.  ?   Pupils: Pupils are equal, round, and reactive to light.  ?Neck:  ?   Thyroid: No thyroid mass.  ?   Trachea: No tracheal deviation.  ?Cardiovascular:  ?   Rate and Rhythm: Normal rate and regular rhythm.  ?   Heart sounds: Normal heart sounds. No murmur heard. ?  No gallop.  ?Pulmonary:  ?   Effort: Pulmonary effort is normal. No respiratory distress.  ?   Breath sounds: Normal breath sounds. No stridor. No decreased breath sounds, wheezing, rhonchi or rales.  ?Abdominal:  ?   General: There is no distension.  ?   Palpations: Abdomen is soft.  ?   Tenderness: There is no abdominal tenderness. There is no rebound.  ?Musculoskeletal:     ?   General: No tenderness. Normal range of motion.  ?   Cervical back: Normal range of motion and neck supple.  ?Skin: ?   General: Skin is warm and dry.  ?   Findings: No abrasion or rash.  ?Neurological:  ?   Mental Status: She is alert and oriented to person, place, and time. Mental status is at baseline.  ?   GCS: GCS eye subscore is 4. GCS verbal subscore is 5. GCS motor subscore is 6.  ?   Cranial Nerves: No cranial nerve deficit.  ?   Sensory: No sensory deficit.  ?   Motor: Motor function is intact.  ?Psychiatric:     ?   Attention and Perception: Attention normal.     ?   Speech: Speech normal.     ?   Behavior: Behavior normal.  ? ? ?ED Results / Procedures / Treatments   ?Labs ?(all labs ordered are listed, but only abnormal results are displayed) ?Labs Reviewed  ?COMPREHENSIVE METABOLIC PANEL  ?LIPASE, BLOOD  ?CBC WITH DIFFERENTIAL/PLATELET  ?MAGNESIUM  ? ? ?EKG ?None ? ?Radiology ?No results found. ? ?Procedures ?Procedures   ? ? ?Medications Ordered in ED ?Medications  ?lactated ringers infusion (has no administration in time range)  ?lactated ringers bolus 2,000 mL (has no administration in time range)  ?metoCLOPramide (REGLAN) injection 5 mg (has no administration in time range)  ? ? ?ED Course/ Medical Decision Making/ A&P ?  ?                        ?Medical Decision Making ?Amount and/or Complexity of Data Reviewed ?Labs: ordered. ? ?Risk ?Prescription  drug management. ? ? ?Patient's laboratory studies are significant for hypomagnesemia as well as hypokalemia.  Was given 2 g of magnesium IV piggyback along with oral and IV potassium.  She was also given Reglan and 2 L of IV fluids here.  States that she feels much better at this time.  Plan will be to repeat patient's electrolytes as well as magnesium and discharge home if they are trending upwards.  Care signed out to Dr. Gustavus Messing ? ? ? ? ? ? ? ?Final Clinical Impression(s) / ED Diagnoses ?Final diagnoses:  ?None  ? ? ?Rx / DC Orders ?ED Discharge Orders   ? ? None  ? ?  ? ? ?  ?Lacretia Leigh, MD ?10/14/21 1527 ? ?

## 2021-10-14 NOTE — ED Notes (Signed)
Critical result reported to ?A.Allen  ?magnesium 0.8 ?

## 2021-10-14 NOTE — ED Provider Notes (Signed)
3:36 PM ?Care assumed from Dr. Zenia Resides.  At time of transfer of care, patient is awaiting for repeat labs after electrolyte repletion.  If they are improving, plan of care be to discharge home as previously discussed with previous team. ? ?6:14 PM ?Patient's repeat electrolytes are improving.  Potassium is improving and magnesium is now normal.  We will give a short prescription for some more potassium to take at home as well as a prescription for Reglan which she has tolerated to let her eat and drink here.  Patient was able to eat and drink without difficulty and based on previous plan, we will discharge her. ? ?Patient will follow with PCP and understood return precautions.  Patient discharged in stable condition. ? ? ?Clinical Impression: ?1. Nausea and vomiting, unspecified vomiting type   ?2. Hypokalemia   ?3. Hypomagnesemia   ?4. Diarrhea, unspecified type   ? ? ?Disposition: Discharge ? ?Condition: Good ? ?I have discussed the results, Dx and Tx plan with the pt(& family if present). He/she/they expressed understanding and agree(s) with the plan. Discharge instructions discussed at great length. Strict return precautions discussed and pt &/or family have verbalized understanding of the instructions. No further questions at time of discharge.  ? ? ?New Prescriptions  ? METOCLOPRAMIDE (REGLAN) 10 MG TABLET    Take 1 tablet (10 mg total) by mouth every 6 (six) hours as needed for nausea.  ? POTASSIUM CHLORIDE SA (KLOR-CON M) 20 MEQ TABLET    Take 1 tablet (20 mEq total) by mouth 2 (two) times daily for 3 days.  ? ? ?Follow Up: ?[name], MD ?[place] ?[place] [id number] ?[phone] ? ? ? ? ?[facility] ?[place] ?[phone] [phone] ?[place] ?[phone] ? ? ? ? ? ?  ?Flor Whitacre, Gwenyth Allegra, MD ?10/14/21 1817 ? ?

## 2021-10-14 NOTE — ED Notes (Signed)
Cup of ginger ale provided at this time for PO challenge.  ?

## 2021-10-14 NOTE — ED Triage Notes (Signed)
Pt BIB EMS from home, c/o diarrhea and nausea. Went to doctor two weeks ago to be seen, suggested she be seen at ED d/t labwork. Patient refused. Pt c/o poor appetite x 2 weeks. A&O x4, hx of COPD ? ?BP 136/70 ?P 110 ?SPO2 97%  ?CBG 136 ? ?

## 2021-10-14 NOTE — Discharge Instructions (Signed)
Your history, exam, work-up today revealed some electrolyte disturbances likely related to your nausea, vomit, diarrhea.  They were able to be improved with medications supplementation through IV today.  Please follow-up with your primary team and continue taking oral potassium at home.  Please rest and stay hydrated.  If any symptoms change or worsen acutely, please return to the nearest emergency department. ?

## 2021-10-15 ENCOUNTER — Telehealth: Payer: Self-pay | Admitting: Family Medicine

## 2021-10-15 DIAGNOSIS — R627 Adult failure to thrive: Secondary | ICD-10-CM

## 2021-10-15 MED ORDER — GABAPENTIN 600 MG PO TABS: 600.0000 mg | ORAL_TABLET | Freq: Three times a day (TID) | ORAL | 0 refills | Status: DC

## 2021-10-15 NOTE — Telephone Encounter (Signed)
Patient calling to check status of refills.  Would also like call back from Dr. Thompson Grayer to discuss a "private issue" ? ?Christen Bame, CMA ? ?

## 2021-10-15 NOTE — Telephone Encounter (Signed)
Called patient as I received a refill request for gabapentin and clonazepam yesterday and she called office today to check on refills. ? ?Confirmed I was speaking with patient on the phone. ?Discussed that I am unable to refill her clonazepam as she is only supposed to be taking one daily. She filled it on 10/03/21 and she is not supposed to be out if taking appropriately until 10/17/21. She was upset by this news and states "I have about had it with this." We discussed that she can take the gabapentin TID, and I will refill this. Discussed that she was seen in the ED yesterday, she states she is still weak and having multiple loose stools daily. She has been eating since she left the ED. She still cannot get out of wheelchair or off of toilet without assistance but is not interested in nursing home or rehab. I asked if she is still interested in home health PT like she called about previously, she states she does not remember this but it is possible. She is aware of her appt next Thursday and states she plans to come and I asked her to bring all of her medications. When asked more questions about home function she states " I need to go eat lunch and I am done with this." ? ?All questions and concerns addressed. ?Referral for home health PT sent and gabapentin refilled. ? ?Yehuda Savannah MD ?

## 2021-10-16 NOTE — Addendum Note (Signed)
Addended by: Yehuda Savannah E on: 10/16/2021 08:06 AM ? ? Modules accepted: Orders ? ?

## 2021-10-17 DIAGNOSIS — R0902 Hypoxemia: Secondary | ICD-10-CM | POA: Diagnosis not present

## 2021-10-19 ENCOUNTER — Other Ambulatory Visit: Payer: Self-pay | Admitting: Family Medicine

## 2021-10-19 DIAGNOSIS — F419 Anxiety disorder, unspecified: Secondary | ICD-10-CM

## 2021-10-22 ENCOUNTER — Ambulatory Visit (INDEPENDENT_AMBULATORY_CARE_PROVIDER_SITE_OTHER): Payer: 59 | Admitting: Family Medicine

## 2021-10-22 DIAGNOSIS — K219 Gastro-esophageal reflux disease without esophagitis: Secondary | ICD-10-CM

## 2021-10-22 DIAGNOSIS — R627 Adult failure to thrive: Secondary | ICD-10-CM

## 2021-10-22 DIAGNOSIS — G621 Alcoholic polyneuropathy: Secondary | ICD-10-CM

## 2021-10-22 DIAGNOSIS — R69 Illness, unspecified: Secondary | ICD-10-CM | POA: Diagnosis not present

## 2021-10-22 DIAGNOSIS — F419 Anxiety disorder, unspecified: Secondary | ICD-10-CM

## 2021-10-22 DIAGNOSIS — I10 Essential (primary) hypertension: Secondary | ICD-10-CM | POA: Diagnosis not present

## 2021-10-22 DIAGNOSIS — F32A Depression, unspecified: Secondary | ICD-10-CM | POA: Diagnosis not present

## 2021-10-22 MED ORDER — QUETIAPINE FUMARATE 100 MG PO TABS: 100.0000 mg | ORAL_TABLET | Freq: Every day | ORAL | 0 refills | Status: DC

## 2021-10-22 MED ORDER — BUSPIRONE HCL 10 MG PO TABS: 10.0000 mg | ORAL_TABLET | Freq: Three times a day (TID) | ORAL | 2 refills | Status: DC

## 2021-10-22 MED ORDER — PANTOPRAZOLE SODIUM 40 MG PO TBEC: 40.0000 mg | DELAYED_RELEASE_TABLET | Freq: Every morning | ORAL | 0 refills | Status: DC

## 2021-10-22 NOTE — Assessment & Plan Note (Signed)
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

## 2021-10-22 NOTE — Assessment & Plan Note (Addendum)
-   discussed with patient today that I would no longer be refilling her Klonopin as we have been working for the past year to taper it off and she has been without it for a week and has not been taking it as prescribed by taking it more often,  but is no longer at risk of seizures from withdrawal due to the low dose of 0.'5mg'$  daily. I reiterated to her why it is important to stop this medication, with her alcohol use and history of seizures and nausea/vomiting episodes where she is unable to take her medications it would be poor care to continue to prescribe something that she can dangerously withdraw from when she is unable to tolerate PO. She was open to trying other medications. Asked if she had any other questions. Reminded of things we have tried so far that did not help hydroxyzine, another SSRI (unable to tolerate with GI side effects), referral to psychiatry (she has not wanted to go and not returned calls or called when given number), return to her previous psychiatrist Dr Toy Care (she declines), direct admission for psychiatry consult and SNF placement (she declines). Discussed we could try buspar but I also need to see her in clinic to be able to give good care. She needs lab work done and to examine her weakness, she notes she will call back after coordinating with her sister for an appointment. She was able to repeat that she understands that it is important for me to be able to see her in person to prescribe good care. I also reiterated that I recommend cessation from alcohol use. ?- will start Buspar 10 mg TID, once able to recheck lab work could consider another SSRI trial but want to ensure no hyponatremia first ?- she denies SI after being asked multiple times ?

## 2021-10-22 NOTE — Assessment & Plan Note (Signed)
-   hypotensive at previous appt, with poor PO intake, discussed previously and reminded today to discontinue amlodipine until we can recheck her BP in the office ?

## 2021-10-22 NOTE — Assessment & Plan Note (Signed)
-   without SI, Seroquel for sleep refilled ?

## 2021-10-22 NOTE — Progress Notes (Addendum)
SUBJECTIVE:   CHIEF COMPLAINT / HPI:  converted to virtual phone visit per patient preference  Telemedicine Visit  Patient consented to have virtual visit and was identified by name and date of birth. Method of visit: Telephone  Encounter participants: Patient: Karen Casey - located at home Provider: [name] - located at [facility] Others (if applicable): none  Neuropathy- in feet. Hard to feel anything in her feet. Feels numb and hurting. Taking nortrptyline from neurologist.  Weakness- Still feeling weak, can't get out of her chair by herself. Hardly eating anything, because hard to chew and tastes bad. No changes her in back pain, denies saddle paresthesia, no bowel or bladder incontinence, having skin breakdown on her bottom. Not interested in direct admission for hospital to work with PT or SNF placement. "I will not go back to the hospital."  Anxiety/depression - wanting a refill on her Klonopin. "You're ruining my life by not prescribing me my medicines." Open to trying other medications. Not willing to see a psychiatrist. Has bottles at home and needs refill on Seroquel. Denies SI.  Diarrhea- still having loss stools. Can't get to bathroom in time when she has diarrhea. Drinking fluids- 2 gatorades per day and an Ensure. Not drinking any alcohol, last Wednesday she had a drink, just one. Still having daily nausea or vomiting. No fevers or chills  HTN- no longer taking her amlodipine, out of it, has not checked it lately. Out and needs a refill.  PERTINENT  PMH / PSH: HTN, pAfib, COPD  OBJECTIVE:   There were no vitals taken for this visit.  Respiratory: speaking in complete sentences  ASSESSMENT/PLAN:   Anxiety disorder - discussed with patient today that I would no longer be refilling her Klonopin as we have been working for the past year to taper it off and she has been without it for a week and has not been taking it as prescribed by taking it more often,  but  is no longer at risk of seizures from withdrawal due to the low dose of 0.'5mg'$  daily. I reiterated to her why it is important to stop this medication, with her alcohol use and history of seizures and nausea/vomiting episodes where she is unable to take her medications it would be poor care to continue to prescribe something that she can dangerously withdraw from when she is unable to tolerate PO. She was open to trying other medications. Asked if she had any other questions. Reminded of things we have tried so far that did not help hydroxyzine, another SSRI (unable to tolerate with GI side effects), referral to psychiatry (she has not wanted to go and not returned calls or called when given number), return to her previous psychiatrist Dr Toy Care (she declines), direct admission for psychiatry consult and SNF placement (she declines). Discussed we could try buspar but I also need to see her in clinic to be able to give good care. She needs lab work done and to examine her weakness, she notes she will call back after coordinating with her sister for an appointment. She was able to repeat that she understands that it is important for me to be able to see her in person to prescribe good care. I also reiterated that I recommend cessation from alcohol use. - will start Buspar 10 mg TID, once able to recheck lab work could consider another SSRI trial but want to ensure no hyponatremia first - she denies SI after being asked multiple times  Depression - without SI, Seroquel for sleep refilled  HTN (hypertension) - hypotensive at previous appt, with poor PO intake, discussed previously and reminded today to discontinue amlodipine until we can recheck her BP in the office  Failure to thrive in adult - discussed needing to see her in office to repeat lab work. While she denies SI today, she is not eating well, not able to be seen in office for appointments, and not wanting to try other therapies and treatments for  anxiety. Discussed it is poor care to follow her virtually and I need to be able to examine her in person. She expressed understanding and states she will call back to make an office visit. Continues to decline even direct hospital admission or SNF placement for her severe deconditioning and malnutrition. Continue to recommend cessation of alcohol use.  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

## 2021-10-22 NOTE — Assessment & Plan Note (Signed)
-   discussed needing to see her in office to repeat lab work. While she denies SI today, she is not eating well, not able to be seen in office for appointments, and not wanting to try other therapies and treatments for anxiety. Discussed it is poor care to follow her virtually and I need to be able to examine her in person. She expressed understanding and states she will call back to make an office visit. Continues to decline even direct hospital admission or SNF placement for her severe deconditioning and malnutrition. Continue to recommend cessation of alcohol use. ?

## 2021-10-23 ENCOUNTER — Telehealth: Payer: Self-pay

## 2021-10-23 NOTE — Telephone Encounter (Signed)
Patient calls nurse line requesting to speak with PCP.  ? ?Patient reports she is experiencing feelings of a panic attack due to not having Clonopin.  ? ?Patient reports "heavy chest" and feelings of anxiousness.  ? ?Patient was given Buspar at recent apt. Patient asked if this has been helping. Patient stated, "I don't have a car to go get it, but I need my Clonopin."  ? ?Patient advised ED visit if she continues to feel overwhelmed.  ? ?Patient advised to pick up recent medications as soon as able.  ? ?Will forward to PCP.  ?

## 2021-10-26 ENCOUNTER — Telehealth: Payer: Self-pay | Admitting: Family Medicine

## 2021-10-26 NOTE — Telephone Encounter (Signed)
Patient called to schedule appointment with Dr. Thompson Grayer, before her sister goes out of town. I told her that her doctors next available would be May the 24th. She stated her sister would be out of town then, so I contacted Dr. Thompson Grayer and asked what she would like for me to do. She stated she needed to see another faculty doctor before her sister went on vacation. So, I offered patient appointment with Dr. Nori Riis on this Wednesday the 17th. Patient stated she wasn't seeing anyone that doesn't know anything about her. So I asked again to clarify when is your sister going out of town. She stated the beginning of June to the beginning of July. I said okay ma'am Dr. Thompson Grayer has May the 24th you sister is leaving the beginning of June this is May. She stated "do not talk to me like I am stupid, I know what month it is . I'm not dumb." So I called Dr. Thompson Grayer and told her what happened, she stated to trying again to let her know she would talk to doctor Nori Riis. I got back on the phone with the patient and told her that Dr. Thompson Grayer wanted her to see Dr. Nori Riis and that she would talk to Dr. Nori Riis about her, and patient stated I will have to think about it you done pissed me off and hung up the phone.  ? ?Thanks!   ?

## 2021-10-27 ENCOUNTER — Telehealth: Payer: Self-pay

## 2021-10-27 NOTE — Telephone Encounter (Signed)
Patient calls nurse line requesting home health services.  ? ?Patient reports all she does is lay in the bed all day and does not get up except to use the bathroom. Patient reports she is growing weaker everyday and feels she can no longer use her legs.  ? ?Patient denies any numbness or concerns for output.  ? ?Patient continues to deny an apt.  ? ?Will forward to PCP for advisement.  ?

## 2021-10-30 ENCOUNTER — Other Ambulatory Visit: Payer: Self-pay | Admitting: *Deleted

## 2021-10-30 DIAGNOSIS — K219 Gastro-esophageal reflux disease without esophagitis: Secondary | ICD-10-CM

## 2021-10-30 MED ORDER — ONDANSETRON 8 MG PO TBDP: 8.0000 mg | ORAL_TABLET | Freq: Three times a day (TID) | ORAL | 0 refills | Status: DC | PRN

## 2021-11-02 ENCOUNTER — Telehealth: Payer: Self-pay | Admitting: Family Medicine

## 2021-11-02 NOTE — Telephone Encounter (Signed)
Will forward to MD. Caldonia Leap,CMA  

## 2021-11-02 NOTE — Telephone Encounter (Signed)
Patient called and made appointment with Dr. Thompson Grayer. I asked what she was needing to be seen for and she stated she can't walk she also stated "I told you this last week". I made her appointment for the 30th at 10:10. Patient stated she wanted doctor to call her as she is very scared about this and is wanting to speak to her doctor.   Please advise.  Thanks!

## 2021-11-03 ENCOUNTER — Other Ambulatory Visit: Payer: Self-pay | Admitting: Family Medicine

## 2021-11-03 ENCOUNTER — Other Ambulatory Visit: Payer: Self-pay | Admitting: Gastroenterology

## 2021-11-03 ENCOUNTER — Telehealth: Payer: Self-pay

## 2021-11-03 DIAGNOSIS — F32A Depression, unspecified: Secondary | ICD-10-CM

## 2021-11-03 DIAGNOSIS — I480 Paroxysmal atrial fibrillation: Secondary | ICD-10-CM

## 2021-11-03 DIAGNOSIS — G4700 Insomnia, unspecified: Secondary | ICD-10-CM

## 2021-11-03 NOTE — Telephone Encounter (Signed)
[place] calls nurse line requesting verbal orders for [facility] PT as follows.   1x a week for 4 weeks   Medical SW and OT evaluation.   [place] orders given.

## 2021-11-04 ENCOUNTER — Telehealth: Payer: Self-pay | Admitting: Family Medicine

## 2021-11-04 NOTE — Telephone Encounter (Signed)
Called Ms Goll x2, phone disconnected. Dr Gwendlyn Deutscher present. Left VM reminding her of appt next week, and need to come in person to be seen. Discussed red flag signs/sx to go to ED if worsening prior to appointment.  Will send certified letter.  Yehuda Savannah MD

## 2021-11-05 ENCOUNTER — Telehealth: Payer: Self-pay | Admitting: Family Medicine

## 2021-11-05 NOTE — Telephone Encounter (Signed)
Letter printed, and certified letter given to Karen Casey for sending to patient.   Yehuda Savannah MD

## 2021-11-06 ENCOUNTER — Telehealth: Payer: Self-pay

## 2021-11-06 NOTE — Telephone Encounter (Signed)
Leanne Case Manager calls nurse line reporting patient discharge from home health services.   Leanne reports a lot of difficulty trying to connect with patient via telephone to set up home health apts.   Leanne reports the patient stated, "I sleep all day and then I want to go out in the late afternoon evenings."  Leanne reports the patient is not home bound at this time if she can "go out" and therefore not a candidate for home health services.   Patient is no longer under [facility] care.

## 2021-11-10 ENCOUNTER — Ambulatory Visit: Payer: 59 | Admitting: Family Medicine

## 2021-11-10 ENCOUNTER — Other Ambulatory Visit: Payer: Self-pay | Admitting: Family Medicine

## 2021-11-10 DIAGNOSIS — G6289 Other specified polyneuropathies: Secondary | ICD-10-CM

## 2021-11-16 ENCOUNTER — Other Ambulatory Visit: Payer: Self-pay | Admitting: Family Medicine

## 2021-11-16 DIAGNOSIS — F419 Anxiety disorder, unspecified: Secondary | ICD-10-CM

## 2021-11-17 ENCOUNTER — Encounter: Payer: Self-pay | Admitting: *Deleted

## 2021-11-20 ENCOUNTER — Encounter (HOSPITAL_COMMUNITY): Payer: Self-pay

## 2021-11-20 ENCOUNTER — Inpatient Hospital Stay (HOSPITAL_COMMUNITY)
Admission: EM | Admit: 2021-11-20 | Discharge: 2021-12-01 | DRG: 177 | Disposition: A | Discharge status: Home Health | Payer: Medicare Other | Attending: Internal Medicine | Admitting: Internal Medicine

## 2021-11-20 ENCOUNTER — Emergency Department (HOSPITAL_COMMUNITY): Payer: Medicare Other

## 2021-11-20 DIAGNOSIS — I110 Hypertensive heart disease with heart failure: Secondary | ICD-10-CM | POA: Diagnosis present

## 2021-11-20 DIAGNOSIS — K560 Paralytic ileus: Secondary | ICD-10-CM | POA: Diagnosis present

## 2021-11-20 DIAGNOSIS — Z79899 Other long term (current) drug therapy: Secondary | ICD-10-CM

## 2021-11-20 DIAGNOSIS — J690 Pneumonitis due to inhalation of food and vomit: Principal | ICD-10-CM | POA: Diagnosis present

## 2021-11-20 DIAGNOSIS — E871 Hypo-osmolality and hyponatremia: Secondary | ICD-10-CM | POA: Diagnosis present

## 2021-11-20 DIAGNOSIS — E875 Hyperkalemia: Secondary | ICD-10-CM | POA: Diagnosis not present

## 2021-11-20 DIAGNOSIS — R627 Adult failure to thrive: Secondary | ICD-10-CM | POA: Diagnosis present

## 2021-11-20 DIAGNOSIS — J441 Chronic obstructive pulmonary disease with (acute) exacerbation: Secondary | ICD-10-CM | POA: Diagnosis present

## 2021-11-20 DIAGNOSIS — W06XXXA Fall from bed, initial encounter: Secondary | ICD-10-CM | POA: Diagnosis present

## 2021-11-20 DIAGNOSIS — J189 Pneumonia, unspecified organism: Secondary | ICD-10-CM

## 2021-11-20 DIAGNOSIS — Z9981 Dependence on supplemental oxygen: Secondary | ICD-10-CM

## 2021-11-20 DIAGNOSIS — F17210 Nicotine dependence, cigarettes, uncomplicated: Secondary | ICD-10-CM | POA: Diagnosis present

## 2021-11-20 DIAGNOSIS — J449 Chronic obstructive pulmonary disease, unspecified: Secondary | ICD-10-CM | POA: Diagnosis present

## 2021-11-20 DIAGNOSIS — R7989 Other specified abnormal findings of blood chemistry: Secondary | ICD-10-CM

## 2021-11-20 DIAGNOSIS — Z8249 Family history of ischemic heart disease and other diseases of the circulatory system: Secondary | ICD-10-CM

## 2021-11-20 DIAGNOSIS — Z720 Tobacco use: Secondary | ICD-10-CM | POA: Diagnosis present

## 2021-11-20 DIAGNOSIS — K219 Gastro-esophageal reflux disease without esophagitis: Secondary | ICD-10-CM | POA: Diagnosis present

## 2021-11-20 DIAGNOSIS — F1090 Alcohol use, unspecified, uncomplicated: Secondary | ICD-10-CM | POA: Diagnosis present

## 2021-11-20 DIAGNOSIS — Z7901 Long term (current) use of anticoagulants: Secondary | ICD-10-CM

## 2021-11-20 DIAGNOSIS — G9340 Encephalopathy, unspecified: Secondary | ICD-10-CM

## 2021-11-20 DIAGNOSIS — E876 Hypokalemia: Secondary | ICD-10-CM

## 2021-11-20 DIAGNOSIS — I482 Chronic atrial fibrillation, unspecified: Secondary | ICD-10-CM | POA: Diagnosis present

## 2021-11-20 DIAGNOSIS — Z789 Other specified health status: Secondary | ICD-10-CM | POA: Diagnosis present

## 2021-11-20 DIAGNOSIS — K700 Alcoholic fatty liver: Secondary | ICD-10-CM | POA: Diagnosis present

## 2021-11-20 DIAGNOSIS — R296 Repeated falls: Secondary | ICD-10-CM | POA: Diagnosis present

## 2021-11-20 DIAGNOSIS — A419 Sepsis, unspecified organism: Secondary | ICD-10-CM

## 2021-11-20 DIAGNOSIS — I959 Hypotension, unspecified: Secondary | ICD-10-CM | POA: Diagnosis not present

## 2021-11-20 DIAGNOSIS — J9601 Acute respiratory failure with hypoxia: Secondary | ICD-10-CM

## 2021-11-20 DIAGNOSIS — G8929 Other chronic pain: Secondary | ICD-10-CM | POA: Diagnosis present

## 2021-11-20 DIAGNOSIS — Y92003 Bedroom of unspecified non-institutional (private) residence as the place of occurrence of the external cause: Secondary | ICD-10-CM

## 2021-11-20 DIAGNOSIS — G9341 Metabolic encephalopathy: Secondary | ICD-10-CM | POA: Diagnosis present

## 2021-11-20 DIAGNOSIS — I5032 Chronic diastolic (congestive) heart failure: Secondary | ICD-10-CM | POA: Diagnosis present

## 2021-11-20 DIAGNOSIS — S2231XA Fracture of one rib, right side, initial encounter for closed fracture: Secondary | ICD-10-CM | POA: Diagnosis present

## 2021-11-20 DIAGNOSIS — I480 Paroxysmal atrial fibrillation: Secondary | ICD-10-CM | POA: Diagnosis present

## 2021-11-20 DIAGNOSIS — Z818 Family history of other mental and behavioral disorders: Secondary | ICD-10-CM

## 2021-11-20 DIAGNOSIS — Z820 Family history of epilepsy and other diseases of the nervous system: Secondary | ICD-10-CM

## 2021-11-20 DIAGNOSIS — R0789 Other chest pain: Secondary | ICD-10-CM | POA: Diagnosis not present

## 2021-11-20 DIAGNOSIS — I471 Supraventricular tachycardia: Secondary | ICD-10-CM | POA: Diagnosis present

## 2021-11-20 DIAGNOSIS — Z781 Physical restraint status: Secondary | ICD-10-CM

## 2021-11-20 DIAGNOSIS — F1010 Alcohol abuse, uncomplicated: Secondary | ICD-10-CM | POA: Diagnosis present

## 2021-11-20 DIAGNOSIS — G629 Polyneuropathy, unspecified: Secondary | ICD-10-CM | POA: Diagnosis present

## 2021-11-20 LAB — COMPREHENSIVE METABOLIC PANEL
ALT: 144 U/L — ABNORMAL HIGH (ref 0–44)
AST: 393 U/L — ABNORMAL HIGH (ref 15–41)
Albumin: 2.1 g/dL — ABNORMAL LOW (ref 3.5–5.0)
Alkaline Phosphatase: 155 U/L — ABNORMAL HIGH (ref 38–126)
Anion gap: 17 — ABNORMAL HIGH (ref 5–15)
BUN: 11 mg/dL (ref 8–23)
CO2: 28 mmol/L (ref 22–32)
Calcium: 7.7 mg/dL — ABNORMAL LOW (ref 8.9–10.3)
Chloride: 89 mmol/L — ABNORMAL LOW (ref 98–111)
Creatinine, Ser: 1 mg/dL (ref 0.44–1.00)
GFR, Estimated: >60 mL/min (ref >60)
Glucose, Bld: 132 mg/dL — ABNORMAL HIGH (ref 70–99)
Potassium: 3 mmol/L — ABNORMAL LOW (ref 3.5–5.1)
Sodium: 134 mmol/L — ABNORMAL LOW (ref 135–145)
Total Bilirubin: 1.1 mg/dL (ref 0.3–1.2)
Total Protein: 5 g/dL — ABNORMAL LOW (ref 6.5–8.1)

## 2021-11-20 LAB — PROTIME-INR
INR: 1.3 — ABNORMAL HIGH (ref 0.8–1.2)
Prothrombin Time: 16.2 seconds — ABNORMAL HIGH (ref 11.4–15.2)

## 2021-11-20 LAB — CBC WITH DIFFERENTIAL/PLATELET
Abs Immature Granulocytes: 0.1 10*3/uL — ABNORMAL HIGH (ref 0.00–0.07)
Basophils Absolute: 0 10*3/uL (ref 0.0–0.1)
Basophils Relative: 0 %
Eosinophils Absolute: 0 10*3/uL (ref 0.0–0.5)
Eosinophils Relative: 0 %
HCT: 41.8 % (ref 36.0–46.0)
Hemoglobin: 14.7 g/dL (ref 12.0–15.0)
Immature Granulocytes: 1 %
Lymphocytes Relative: 12 %
Lymphs Abs: 1.9 10*3/uL (ref 0.7–4.0)
MCH: 34 pg (ref 26.0–34.0)
MCHC: 35.2 g/dL (ref 30.0–36.0)
MCV: 96.8 fL (ref 80.0–100.0)
Monocytes Absolute: 0.9 10*3/uL (ref 0.1–1.0)
Monocytes Relative: 5 %
Neutro Abs: 13.6 10*3/uL — ABNORMAL HIGH (ref 1.7–7.7)
Neutrophils Relative %: 82 %
Platelets: 233 10*3/uL (ref 150–400)
RBC: 4.32 MIL/uL (ref 3.87–5.11)
RDW: 12.4 % (ref 11.5–15.5)
WBC: 16.5 10*3/uL — ABNORMAL HIGH (ref 4.0–10.5)
nRBC: 0 % (ref 0.0–0.2)

## 2021-11-20 LAB — CK: Total CK: 47 U/L (ref 38–234)

## 2021-11-20 LAB — LACTIC ACID, PLASMA: Lactic Acid, Venous: 6.4 mmol/L (ref 0.5–1.9)

## 2021-11-20 MED ORDER — SODIUM CHLORIDE 0.9 % IV SOLN
1.0000 g | Freq: Once | INTRAVENOUS | Status: AC
  Administered 2021-11-20: 1 g via INTRAVENOUS
  Filled 2021-11-20: qty 10

## 2021-11-20 MED ORDER — MORPHINE SULFATE (PF) 4 MG/ML IV SOLN
4.0000 mg | Freq: Once | INTRAVENOUS | Status: AC
  Administered 2021-11-20: 4 mg via INTRAVENOUS
  Filled 2021-11-20: qty 1

## 2021-11-20 MED ORDER — IOHEXOL 300 MG/ML  SOLN
100.0000 mL | Freq: Once | INTRAMUSCULAR | Status: AC | PRN
  Administered 2021-11-20: 100 mL via INTRAVENOUS

## 2021-11-20 MED ORDER — SODIUM CHLORIDE 0.9 % IV SOLN
500.0000 mg | Freq: Once | INTRAVENOUS | Status: AC
  Administered 2021-11-20: 500 mg via INTRAVENOUS
  Filled 2021-11-20: qty 5

## 2021-11-20 MED ORDER — SODIUM CHLORIDE 0.9 % IV BOLUS
1000.0000 mL | Freq: Once | INTRAVENOUS | Status: AC
  Administered 2021-11-20: 1000 mL via INTRAVENOUS

## 2021-11-20 MED ORDER — LORAZEPAM 2 MG/ML IJ SOLN
1.0000 mg | Freq: Once | INTRAMUSCULAR | Status: AC
  Administered 2021-11-20: 1 mg via INTRAVENOUS
  Filled 2021-11-20: qty 1

## 2021-11-20 MED ORDER — POTASSIUM CHLORIDE 10 MEQ/100ML IV SOLN
10.0000 meq | INTRAVENOUS | Status: AC
  Administered 2021-11-20 – 2021-11-21 (×2): 10 meq via INTRAVENOUS
  Filled 2021-11-20 (×2): qty 100

## 2021-11-20 NOTE — ED Provider Notes (Signed)
[facility] DEPT Provider Note   CSN: [id number] Arrival date & time: 11/20/21  2047     History  Chief Complaint  Patient presents with   UTI    TOKIKO DIEFENDERFER is a 66 y.o. female history of alcohol abuse, peripheral neuropathy, here presenting with fall and failure to thrive.  Patient states that she has not been doing well at home.  Apparently she has recurrent falls.  She fell yesterday after her husband tried to change her she and fell and hit her right flank area.  Patient has been laying in bed and not moving and states that she is too weak to walk around.  Patient was noted to have bruising in the right flank area as well as the left buttock.  Husband apparently told EMS that she he was unable to care for her at home anymore.  The history is provided by the patient.       Home Medications Prior to Admission medications   Medication Sig Start Date End Date Taking? Authorizing Provider  acetaminophen (TYLENOL) 650 MG CR tablet Take 1,950 mg by mouth 2 (two) times daily as needed for pain.    [provider]  albuterol (VENTOLIN HFA) 108 (90 Base) MCG/ACT inhaler Inhale 2 puffs into the lungs every 6 (six) hours as needed for wheezing or shortness of breath. 09/23/21   [name], MD  ANORO ELLIPTA 62.5-25 MCG/ACT AEPB INHALE 1 PUFF BY MOUTH EVERY DAY Patient taking differently: Inhale 1 puff into the lungs every morning. 06/09/21   Martyn Malay, MD  busPIRone (BUSPAR) 10 MG tablet TAKE 1 TABLET BY MOUTH THREE TIMES A DAY 11/16/21   [name], MD  CALCIUM-MAGNESIUM-ZINC PO Take 1 tablet by mouth daily. Patient not taking: Reported on 10/22/2021    [provider]  Cholecalciferol (VITAMIN D3 PO) Take 1 capsule by mouth daily. Patient not taking: Reported on 10/22/2021    [provider]  famotidine (PEPCID) 20 MG tablet Take 20 mg by mouth 2 (two) times daily as needed for heartburn or indigestion.     [provider]  feeding supplement (ENSURE ENLIVE / ENSURE PLUS) LIQD Take 237 mLs by mouth 2 (two) times daily between meals. Patient taking differently: Take 237 mLs by mouth 2 (two) times daily as needed (meal supplement). 01/06/21   Alen Bleacher, MD  gabapentin (NEURONTIN) 600 MG tablet Take 1 tablet (600 mg total) by mouth 3 (three) times daily. 10/15/21   [name], MD  metoCLOPramide (REGLAN) 10 MG tablet Take 1 tablet (10 mg total) by mouth every 6 (six) hours as needed for nausea. Patient not taking: Reported on 10/22/2021 10/14/21   Tegeler, Gwenyth Allegra, MD  Multiple Vitamins-Minerals (CENTRUM SILVER 50+WOMEN PO) Take 1 tablet by mouth daily.    [provider]  nortriptyline (PAMELOR) 25 MG capsule TAKE 2 CAPSULES (50 MG TOTAL) BY MOUTH EVERY DAY AT BEDTIME 11/10/21   [name], MD  ondansetron (ZOFRAN-ODT) 8 MG disintegrating tablet Take 1 tablet (8 mg total) by mouth every 8 (eight) hours as needed. 10/30/21   [name], MD  OXYGEN Inhale into the lungs as needed.    [provider]  pantoprazole (PROTONIX) 40 MG tablet Take 1 tablet (40 mg total) by mouth every morning. 10/22/21   [name], MD  promethazine (PHENERGAN) 12.5 MG suppository Place 12.5 mg rectally daily as needed for nausea/vomiting. 01/26/21   [provider]  QUEtiapine (SEROQUEL) 100 MG tablet Take 1 tablet (100 mg total) by mouth at bedtime for 28 days. 10/22/21 11/19/21  [name], MD      Allergies    Capsaicin-menthol and Diclo gel [diclofenac sodium]    Review of Systems   Review of Systems  Neurological:  Positive for weakness.  All other systems reviewed and are negative.   Physical Exam Updated Vital Signs BP (!) 116/91   Pulse (!) 109   Temp 97.8 F (36.6 C) (Oral)   Resp 16   Ht '5\' 3"'$  (1.6 m)   Wt 63.5 kg   SpO2 98%   BMI 24.80 kg/m  Physical Exam Vitals and nursing note reviewed.  Constitutional:      Comments: Chronically  ill   HENT:     Head: Normocephalic.     Mouth/Throat:     Mouth: Mucous membranes are dry.  Eyes:     Extraocular Movements: Extraocular movements intact.     Pupils: Pupils are equal, round, and reactive to light.  Cardiovascular:     Rate and Rhythm: Regular rhythm. Tachycardia present.     Pulses: Normal pulses.     Heart sounds: Normal heart sounds.  Pulmonary:     Effort: Pulmonary effort is normal.     Breath sounds: Normal breath sounds.  Abdominal:     General: Abdomen is flat.     Palpations: Abdomen is soft.     Comments: Bruising R flank. Patient has stage 2 L sacral decub ulcer   Musculoskeletal:        General: Normal range of motion.     Cervical back: Normal range of motion and neck supple.  Skin:    General: Skin is warm.     Capillary Refill: Capillary refill takes less than 2 seconds.  Neurological:     General: No focal deficit present.     Mental Status: She is alert and oriented to person, place, and time.  Psychiatric:        Mood and Affect: Mood normal.        Behavior: Behavior normal.     ED Results / Procedures / Treatments   Labs (all labs ordered are listed, but only abnormal results are displayed) Labs Reviewed  CBC WITH DIFFERENTIAL/PLATELET - Abnormal; Notable for the following components:      Result Value   WBC 16.5 (*)    Neutro Abs 13.6 (*)    Abs Immature Granulocytes 0.10 (*)    All other components within normal limits  PROTIME-INR - Abnormal; Notable for the following components:   Prothrombin Time 16.2 (*)    INR 1.3 (*)    All other components within normal limits  CULTURE, BLOOD (ROUTINE X 2)  CULTURE, BLOOD (ROUTINE X 2)  COMPREHENSIVE METABOLIC PANEL  LACTIC ACID, PLASMA  URINALYSIS, ROUTINE W REFLEX MICROSCOPIC  CK  ETHANOL  LACTIC ACID, PLASMA    EKG EKG Interpretation  Date/Time:  Friday November 20 2021 21:52:52 EDT Ventricular Rate:  111 PR Interval:  113 QRS Duration: 63 QT Interval:  325 QTC  Calculation: 442 R Axis:   -18 Text Interpretation: Sinus tachycardia Ventricular trigeminy Borderline left axis deviation Probable anterior infarct, age indeterminate Since last tracing rate faster Confirmed by Wandra Arthurs [phone]) on 11/20/2021 9:58:57 PM  Radiology No results found.  Procedures Procedures    CRITICAL CARE Performed by: Wandra Arthurs   Total critical care time: 30 minutes  Critical care time was  exclusive of separately billable procedures and treating other patients.  Critical care was necessary to treat or prevent imminent or life-threatening deterioration.  Critical care was time spent personally by me on the following activities: development of treatment plan with patient and/or surrogate as well as nursing, discussions with consultants, evaluation of patient's response to treatment, examination of patient, obtaining history from patient or surrogate, ordering and performing treatments and interventions, ordering and review of laboratory studies, ordering and review of radiographic studies, pulse oximetry and re-evaluation of patient's condition.   Medications Ordered in ED Medications  cefTRIAXone (ROCEPHIN) 1 g in sodium chloride 0.9 % 100 mL IVPB (has no administration in time range)  sodium chloride 0.9 % bolus 1,000 mL (1,000 mLs Intravenous New Bag/Given 11/20/21 2141)  morphine (PF) 4 MG/ML injection 4 mg (4 mg Intravenous Given 11/20/21 2142)  LORazepam (ATIVAN) injection 1 mg (1 mg Intravenous Given 11/20/21 2147)    ED Course/ Medical Decision Making/ A&P                           Medical Decision Making GODDESS GEBBIA is a 66 y.o. female here presenting with failure to thrive.  Patient has recurrent falls and now has tenderness and bruising on the right flank area.  Unclear if she hit her head or not.  Patient also started developing sacral decub ulcer on the left side already.  Patient also has been drinking alcohol.  Concern for possible rhabdomyolysis  versus sepsis from UTI or pneumonia.  Plan to get CT abdomen pelvis and will get CBC and CMP and lactate and cultures and UA and CT abdomen pelvis and CT head.   11:22 PM I reviewed patient's labs and independently reviewed imaging. CT showed acute R tenth rib fracture.  Patient also has pneumonia on CT. patient's labs showed elevated white blood cell count of 16 and lactate of 6.4.  Patient also has elevated LFTs as well consistent with her alcohol use.  I discussed case with Dr. Thermon Leyland, trauma surgeon on-call.  He states that if patient only has 1 rib fracture, patient does not need trauma to get involved.  Patient could be admitted medically at [facility] long.  If the hospitalist wants trauma involved patient can be transferred to [facility].  At this point she will be admitted for sepsis from pneumonia.    Problems Addressed: Community acquired pneumonia, unspecified laterality: acute illness or injury Sepsis, due to unspecified organism, unspecified whether acute organ dysfunction present [facility]): acute illness or injury  Amount and/or Complexity of Data Reviewed Labs: ordered. Decision-making details documented in ED Course. Radiology: ordered and independent interpretation performed. Decision-making details documented in ED Course. ECG/medicine tests: ordered and independent interpretation performed. Decision-making details documented in ED Course.  Risk Prescription drug management. Decision regarding hospitalization.    Final Clinical Impression(s) / ED Diagnoses Final diagnoses:  None    Rx / DC Orders ED Discharge Orders     None         Drenda Freeze, MD 11/20/21 2329

## 2021-11-20 NOTE — Assessment & Plan Note (Addendum)
New. Pt recently started Seroquel last month. PCP refusing to refill pt's clonazepam due to prior hx of benzo abuse. Will stop seroquel. EDP has ordered hepatitis panel. Repeat CMP in AM.

## 2021-11-20 NOTE — ED Notes (Signed)
Pt to CT at this time.

## 2021-11-20 NOTE — ED Notes (Signed)
2 attempt for In & Out cath unsuccessful, bladder scan showed pt's bladder is empty, pt has hx of incontinence, husband reported pt has urinated on self multiple times today. Dr. Darl Householder aware.

## 2021-11-20 NOTE — ED Triage Notes (Signed)
Pt arrived from home by [facility] for n/v.d x4 days secondary to GERD, pt also reports UTI s/s for 1 week. EMS reports pt was laying in urine and poop when found. [place] recently, fell in cabinet, bruise to right lower back. Husband weaker then normal. EMS reports GCS 14. Known hx of incontinence. Alcohol abuse and hx of same.   Husband is requesting resources for pt as he can no longer take care of her.

## 2021-11-21 ENCOUNTER — Other Ambulatory Visit: Payer: Self-pay

## 2021-11-21 DIAGNOSIS — J449 Chronic obstructive pulmonary disease, unspecified: Secondary | ICD-10-CM

## 2021-11-21 DIAGNOSIS — R627 Adult failure to thrive: Secondary | ICD-10-CM

## 2021-11-21 DIAGNOSIS — R7989 Other specified abnormal findings of blood chemistry: Secondary | ICD-10-CM

## 2021-11-21 DIAGNOSIS — I480 Paroxysmal atrial fibrillation: Secondary | ICD-10-CM

## 2021-11-21 DIAGNOSIS — E876 Hypokalemia: Secondary | ICD-10-CM

## 2021-11-21 DIAGNOSIS — Z720 Tobacco use: Secondary | ICD-10-CM

## 2021-11-21 DIAGNOSIS — S2231XA Fracture of one rib, right side, initial encounter for closed fracture: Secondary | ICD-10-CM

## 2021-11-21 DIAGNOSIS — Z789 Other specified health status: Secondary | ICD-10-CM | POA: Diagnosis not present

## 2021-11-21 LAB — CBC WITH DIFFERENTIAL/PLATELET
Abs Immature Granulocytes: 0.04 10*3/uL (ref 0.00–0.07)
Basophils Absolute: 0 10*3/uL (ref 0.0–0.1)
Basophils Relative: 0 %
Eosinophils Absolute: 0 10*3/uL (ref 0.0–0.5)
Eosinophils Relative: 0 %
HCT: 36.6 % (ref 36.0–46.0)
Hemoglobin: 12.7 g/dL (ref 12.0–15.0)
Immature Granulocytes: 0 %
Lymphocytes Relative: 17 %
Lymphs Abs: 2 10*3/uL (ref 0.7–4.0)
MCH: 34.3 pg — ABNORMAL HIGH (ref 26.0–34.0)
MCHC: 34.7 g/dL (ref 30.0–36.0)
MCV: 98.9 fL (ref 80.0–100.0)
Monocytes Absolute: 0.4 10*3/uL (ref 0.1–1.0)
Monocytes Relative: 4 %
Neutro Abs: 9.5 10*3/uL — ABNORMAL HIGH (ref 1.7–7.7)
Neutrophils Relative %: 79 %
Platelets: 170 10*3/uL (ref 150–400)
RBC: 3.7 MIL/uL — ABNORMAL LOW (ref 3.87–5.11)
RDW: 12.7 % (ref 11.5–15.5)
WBC: 12 10*3/uL — ABNORMAL HIGH (ref 4.0–10.5)
nRBC: 0 % (ref 0.0–0.2)

## 2021-11-21 LAB — URINALYSIS, ROUTINE W REFLEX MICROSCOPIC
Bilirubin Urine: NEGATIVE
Glucose, UA: NEGATIVE mg/dL
Hgb urine dipstick: NEGATIVE
Ketones, ur: NEGATIVE mg/dL
Nitrite: NEGATIVE
Protein, ur: 30 mg/dL — AB
Specific Gravity, Urine: >1.046 — ABNORMAL HIGH (ref 1.005–1.030)
WBC, UA: >50 WBC/hpf — ABNORMAL HIGH (ref 0–5)
pH: 5 (ref 5.0–8.0)

## 2021-11-21 LAB — HEPATITIS PANEL, ACUTE
HCV Ab: NONREACTIVE
Hep A IgM: NONREACTIVE
Hep B C IgM: NONREACTIVE
Hepatitis B Surface Ag: NONREACTIVE

## 2021-11-21 LAB — HIV ANTIBODY (ROUTINE TESTING W REFLEX): HIV Screen 4th Generation wRfx: NONREACTIVE

## 2021-11-21 LAB — COMPREHENSIVE METABOLIC PANEL
ALT: 110 U/L — ABNORMAL HIGH (ref 0–44)
AST: 266 U/L — ABNORMAL HIGH (ref 15–41)
Albumin: 1.9 g/dL — ABNORMAL LOW (ref 3.5–5.0)
Alkaline Phosphatase: 131 U/L — ABNORMAL HIGH (ref 38–126)
Anion gap: 11 (ref 5–15)
BUN: 9 mg/dL (ref 8–23)
CO2: 27 mmol/L (ref 22–32)
Calcium: 7 mg/dL — ABNORMAL LOW (ref 8.9–10.3)
Chloride: 98 mmol/L (ref 98–111)
Creatinine, Ser: 0.83 mg/dL (ref 0.44–1.00)
GFR, Estimated: >60 mL/min (ref >60)
Glucose, Bld: 110 mg/dL — ABNORMAL HIGH (ref 70–99)
Potassium: 3 mmol/L — ABNORMAL LOW (ref 3.5–5.1)
Sodium: 136 mmol/L (ref 135–145)
Total Bilirubin: 0.8 mg/dL (ref 0.3–1.2)
Total Protein: 4.5 g/dL — ABNORMAL LOW (ref 6.5–8.1)

## 2021-11-21 LAB — MAGNESIUM: Magnesium: 1.9 mg/dL (ref 1.7–2.4)

## 2021-11-21 LAB — LACTIC ACID, PLASMA: Lactic Acid, Venous: 6.4 mmol/L (ref 0.5–1.9)

## 2021-11-21 LAB — PROCALCITONIN: Procalcitonin: 10.8 ng/mL

## 2021-11-21 LAB — STREP PNEUMONIAE URINARY ANTIGEN: Strep Pneumo Urinary Antigen: NEGATIVE

## 2021-11-21 LAB — ETHANOL: Alcohol, Ethyl (B): <10 mg/dL (ref <10)

## 2021-11-21 MED ORDER — MAGNESIUM SULFATE 2 GM/50ML IV SOLN
2.0000 g | Freq: Once | INTRAVENOUS | Status: AC
  Administered 2021-11-21: 2 g via INTRAVENOUS
  Filled 2021-11-21: qty 50

## 2021-11-21 MED ORDER — SODIUM CHLORIDE 0.9 % IV SOLN
1.0000 g | INTRAVENOUS | Status: DC
  Administered 2021-11-21 – 2021-11-23 (×3): 1 g via INTRAVENOUS
  Filled 2021-11-21 (×3): qty 10

## 2021-11-21 MED ORDER — GABAPENTIN 300 MG PO CAPS
600.0000 mg | ORAL_CAPSULE | Freq: Three times a day (TID) | ORAL | Status: DC
  Administered 2021-11-21 (×3): 600 mg via ORAL
  Filled 2021-11-21 (×3): qty 2

## 2021-11-21 MED ORDER — POTASSIUM CHLORIDE 10 MEQ/100ML IV SOLN
10.0000 meq | INTRAVENOUS | Status: AC
  Administered 2021-11-21 (×4): 10 meq via INTRAVENOUS
  Filled 2021-11-21 (×4): qty 100

## 2021-11-21 MED ORDER — LACTATED RINGERS IV SOLN: INTRAVENOUS | Status: AC

## 2021-11-21 MED ORDER — UMECLIDINIUM-VILANTEROL 62.5-25 MCG/ACT IN AEPB
1.0000 | INHALATION_SPRAY | Freq: Every morning | RESPIRATORY_TRACT | Status: DC
  Administered 2021-11-21 – 2021-11-22 (×2): 1 via RESPIRATORY_TRACT
  Filled 2021-11-21: qty 14

## 2021-11-21 MED ORDER — ONDANSETRON HCL 4 MG PO TABS: 4.0000 mg | ORAL_TABLET | Freq: Four times a day (QID) | ORAL | Status: DC | PRN

## 2021-11-21 MED ORDER — SODIUM CHLORIDE 0.9 % IV SOLN
500.0000 mg | INTRAVENOUS | Status: DC
  Administered 2021-11-21 – 2021-11-23 (×3): 500 mg via INTRAVENOUS
  Filled 2021-11-21 (×3): qty 5

## 2021-11-21 MED ORDER — OXYCODONE HCL 5 MG PO TABS
2.5000 mg | ORAL_TABLET | ORAL | Status: DC | PRN
  Administered 2021-11-21: 2.5 mg via ORAL
  Filled 2021-11-21: qty 1

## 2021-11-21 MED ORDER — ADULT MULTIVITAMIN W/MINERALS CH
1.0000 | ORAL_TABLET | Freq: Every day | ORAL | Status: DC
  Administered 2021-11-21: 1 via ORAL
  Filled 2021-11-21: qty 1

## 2021-11-21 MED ORDER — LORAZEPAM 2 MG/ML IJ SOLN
1.0000 mg | INTRAMUSCULAR | Status: DC | PRN
  Administered 2021-11-21 (×2): 1 mg via INTRAVENOUS
  Administered 2021-11-22: 2 mg via INTRAVENOUS
  Administered 2021-11-22: 3 mg via INTRAVENOUS
  Administered 2021-11-22 – 2021-11-23 (×2): 2 mg via INTRAVENOUS
  Filled 2021-11-21 (×7): qty 1

## 2021-11-21 MED ORDER — LACTATED RINGERS IV SOLN: INTRAVENOUS | Status: DC

## 2021-11-21 MED ORDER — LORAZEPAM 1 MG PO TABS: 1.0000 mg | ORAL_TABLET | ORAL | Status: DC | PRN

## 2021-11-21 MED ORDER — PANTOPRAZOLE SODIUM 40 MG IV SOLR
40.0000 mg | Freq: Two times a day (BID) | INTRAVENOUS | Status: DC
  Administered 2021-11-21 – 2021-11-30 (×19): 40 mg via INTRAVENOUS
  Filled 2021-11-21 (×19): qty 10

## 2021-11-21 MED ORDER — THIAMINE HCL 100 MG/ML IJ SOLN
100.0000 mg | Freq: Every day | INTRAMUSCULAR | Status: DC
  Administered 2021-11-22: 100 mg via INTRAVENOUS
  Filled 2021-11-21: qty 2

## 2021-11-21 MED ORDER — PANTOPRAZOLE SODIUM 40 MG PO TBEC
40.0000 mg | DELAYED_RELEASE_TABLET | Freq: Every morning | ORAL | Status: DC
  Administered 2021-11-21: 40 mg via ORAL
  Filled 2021-11-21: qty 1

## 2021-11-21 MED ORDER — ALBUTEROL SULFATE (2.5 MG/3ML) 0.083% IN NEBU
2.5000 mg | INHALATION_SOLUTION | RESPIRATORY_TRACT | Status: DC | PRN
  Administered 2021-11-22 – 2021-11-24 (×3): 2.5 mg via RESPIRATORY_TRACT
  Filled 2021-11-21 (×3): qty 3

## 2021-11-21 MED ORDER — THIAMINE HCL 100 MG PO TABS
100.0000 mg | ORAL_TABLET | Freq: Every day | ORAL | Status: DC
  Administered 2021-11-21: 100 mg via ORAL
  Filled 2021-11-21: qty 1

## 2021-11-21 MED ORDER — HEPARIN SODIUM (PORCINE) 5000 UNIT/ML IJ SOLN
5000.0000 [IU] | Freq: Three times a day (TID) | INTRAMUSCULAR | Status: DC
Start: 2021-11-21 — End: 2021-12-01
  Administered 2021-11-21 – 2021-12-01 (×31): 5000 [IU] via SUBCUTANEOUS
  Filled 2021-11-21 (×30): qty 1

## 2021-11-21 MED ORDER — FOLIC ACID 1 MG PO TABS
1.0000 mg | ORAL_TABLET | Freq: Every day | ORAL | Status: DC
  Administered 2021-11-21: 1 mg via ORAL
  Filled 2021-11-21: qty 1

## 2021-11-21 MED ORDER — NICOTINE 21 MG/24HR TD PT24
21.0000 mg | MEDICATED_PATCH | Freq: Every day | TRANSDERMAL | Status: DC
Start: 2021-11-21 — End: 2021-12-01
  Administered 2021-11-21 – 2021-12-01 (×12): 21 mg via TRANSDERMAL
  Filled 2021-11-21 (×12): qty 1

## 2021-11-21 MED ORDER — GUAIFENESIN ER 600 MG PO TB12
600.0000 mg | ORAL_TABLET | Freq: Two times a day (BID) | ORAL | Status: DC
  Administered 2021-11-21 (×2): 600 mg via ORAL
  Filled 2021-11-21 (×2): qty 1

## 2021-11-21 MED ORDER — IPRATROPIUM-ALBUTEROL 0.5-2.5 (3) MG/3ML IN SOLN
3.0000 mL | Freq: Three times a day (TID) | RESPIRATORY_TRACT | Status: DC
  Administered 2021-11-21: 3 mL via RESPIRATORY_TRACT
  Filled 2021-11-21: qty 3

## 2021-11-21 MED ORDER — ONDANSETRON HCL 4 MG/2ML IJ SOLN
4.0000 mg | Freq: Four times a day (QID) | INTRAMUSCULAR | Status: DC | PRN
  Administered 2021-11-21 – 2021-11-22 (×3): 4 mg via INTRAVENOUS
  Filled 2021-11-21 (×3): qty 2

## 2021-11-21 MED ORDER — IPRATROPIUM-ALBUTEROL 0.5-2.5 (3) MG/3ML IN SOLN: 3.0000 mL | Freq: Three times a day (TID) | RESPIRATORY_TRACT | Status: DC

## 2021-11-21 MED ORDER — NORTRIPTYLINE HCL 25 MG PO CAPS
50.0000 mg | ORAL_CAPSULE | Freq: Every day | ORAL | Status: DC
  Filled 2021-11-21 (×2): qty 2

## 2021-11-21 MED ORDER — BUSPIRONE HCL 10 MG PO TABS
10.0000 mg | ORAL_TABLET | Freq: Three times a day (TID) | ORAL | Status: DC
  Administered 2021-11-21 (×3): 10 mg via ORAL
  Filled 2021-11-21 (×4): qty 1

## 2021-11-21 NOTE — ED Notes (Addendum)
Secure message sent to receiving RN Marin Shutter, with inpatient, waiting response.

## 2021-11-21 NOTE — Assessment & Plan Note (Addendum)
Pt states she drank 3-4 wine coolers 2-3 days ago. Same timeline when she fell out of bed. Low risk for alcohol withdrawal. Given recent discontinuation of clonazepam by her PCP will not start CIWA. Will continue her on her neurontin 600 mg tid which would also protect her for any potential etoh withdrawal seizures.  Appears that PCP has worked hard over last several years to taper pt off benzos. Would not want to jeopardize PCP hard work by restarting benzos.

## 2021-11-21 NOTE — Plan of Care (Signed)

## 2021-11-21 NOTE — H&P (Addendum)
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  Per triage note, patient was found in urine and feces when EMS arrived to her house.  Patient states that she has been laying in bed for the last 3 days.  She reports alcohol use a couple days ago when she fell out of bed and onto her right side.  On arrival temp 97.8, heart rate 112, blood pressure 139/91 satting 94% on room air.  Labs: Sodium 134, potassium 3.0, bicarb 89, BUN of 11, creatinine 1.0, AST 393, ALT of 144, alk phos of 155  Initial lactic acid 6.4 Repeat lactic acid pending CK 47 Alcohol level less than 10  White count 16.5, hemoglobin 14.7, platelets of 233  CT chest which I have personally interpreted and reviewed shows no large-scale pneumonia.  She has some small cysts groundglass opacities near the diaphragm.  She has a minimally displaced right 10th rib fracture.  Patient given IV fluids in the ER.  EDP discussed the patient's case with trauma surgery due to the patient's rib fracture and history of fall.  Trauma surgery was declined admission.  [facility] contacted for admission.   ED Course: Tachycardic but afebrile on arrival.  Tachycardia improved with IV fluids.  94% on room air.  Review of Systems:  Review of Systems  Constitutional:  Negative for chills and fever.  HENT: Negative.    Eyes: Negative.   Respiratory:  Negative for cough, sputum production, shortness of breath and  wheezing.        Right rib pain after a fall.  Cardiovascular: Negative.   Gastrointestinal: Negative.   Genitourinary: Negative.   Musculoskeletal:  Positive for back pain and falls.  Skin:        Bruising on the right side after her fall.  Neurological:  Positive for weakness.       Complains of neuropathy that is longstanding.  Endo/Heme/Allergies: Negative.   Psychiatric/Behavioral:  Positive for substance abuse. The patient is nervous/anxious.   All other systems reviewed and are negative.   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   Seizure ([place])    08/2020 per patient   Seizures ([place])    xanax withdrawl- December 2013   Urinary incontinence 06/09/2012    Past Surgical History:  Procedure Laterality Date   APPENDECTOMY  BIOPSY  01/04/2021   Procedure: BIOPSY;  Surgeon: Yetta Flock, MD;  Location: [facility];  Service: Gastroenterology;;   BIOPSY  03/12/2021   Procedure: BIOPSY;  Surgeon: Irving Copas., MD;  Location: [facility];  Service: Gastroenterology;;   CATARACT EXTRACTION  06/09/2012   Left eye   COLONOSCOPY WITH PROPOFOL N/A 03/12/2021   Procedure: COLONOSCOPY WITH PROPOFOL;  Surgeon: Irving Copas., MD;  Location: [facility];  Service: Gastroenterology;  Laterality: N/A;   ESOPHAGOGASTRODUODENOSCOPY (EGD) WITH PROPOFOL N/A 01/04/2021   Procedure: ESOPHAGOGASTRODUODENOSCOPY (EGD) WITH PROPOFOL;   Surgeon: Yetta Flock, MD;  Location: [facility];  Service: Gastroenterology;  Laterality: N/A;   left shoulder dislocation  Sept 2011   POLYPECTOMY  03/12/2021   Procedure: POLYPECTOMY;  Surgeon: Mansouraty, Telford Nab., MD;  Location: [facility];  Service: Gastroenterology;;     reports that she has been smoking cigarettes. She has a 21.50 pack-year smoking history. She has never used smokeless tobacco. She reports current alcohol use of about 3.0 standard drinks of alcohol per week. She reports current drug use. Drugs: Marijuana and Other-see comments.  Allergies  Allergen Reactions   Capsaicin-Menthol Other (See Comments)    Burning and peeling on area applied to   Diclo Gel [Diclofenac Sodium] Other (See Comments)    GEL AND CREAM-burning and peeling at the sight of application.    Family History  Problem Relation Age of Onset   Hypertension Mother    Hyperlipidemia Mother    Aneurysm Mother        Rupture - Cause of death   Heart disease Father        MI - cause of death   Depression Father    Parkinsonism Father    Hypertension Sister    Colon cancer Neg Hx    Esophageal cancer Neg Hx    Rectal cancer Neg Hx    Stomach cancer Neg Hx     Prior to Admission medications   Medication Sig Start Date End Date Taking? Authorizing Provider  acetaminophen (TYLENOL) 650 MG CR tablet Take 1,950 mg by mouth 2 (two) times daily as needed for pain.   Yes [provider]  albuterol (VENTOLIN HFA) 108 (90 Base) MCG/ACT inhaler Inhale 2 puffs into the lungs every 6 (six) hours as needed for wheezing or shortness of breath. 09/23/21   [name], MD  ANORO ELLIPTA 62.5-25 MCG/ACT AEPB INHALE 1 PUFF BY MOUTH EVERY DAY Patient taking differently: Inhale 1 puff into the lungs every morning. 06/09/21   Martyn Malay, MD  busPIRone (BUSPAR) 10 MG tablet TAKE 1 TABLET BY MOUTH THREE TIMES A DAY 11/16/21   [name], MD  CALCIUM-MAGNESIUM-ZINC PO Take 1 tablet  by mouth daily. Patient not taking: Reported on 10/22/2021    [provider]  Cholecalciferol (VITAMIN D3 PO) Take 1 capsule by mouth daily. Patient not taking: Reported on 10/22/2021    [provider]  famotidine (PEPCID) 20 MG tablet Take 20 mg by mouth 2 (two) times daily as needed for heartburn or indigestion.    [provider]  feeding supplement (ENSURE ENLIVE / ENSURE PLUS) LIQD Take 237 mLs by mouth 2 (two) times daily between meals. Patient taking differently: Take 237 mLs by mouth 2 (two) times daily as needed (meal supplement). 01/06/21   Alen Bleacher, MD  gabapentin (NEURONTIN) 600 MG tablet Take 1 tablet (600 mg total) by mouth 3 (three) times daily. 10/15/21   [name],  MD  metoCLOPramide (REGLAN) 10 MG tablet Take 1 tablet (10 mg total) by mouth every 6 (six) hours as needed for nausea. Patient not taking: Reported on 10/22/2021 10/14/21   Tegeler, Gwenyth Allegra, MD  Multiple Vitamins-Minerals (CENTRUM SILVER 50+WOMEN PO) Take 1 tablet by mouth daily.    [provider]  nortriptyline (PAMELOR) 25 MG capsule TAKE 2 CAPSULES (50 MG TOTAL) BY MOUTH EVERY DAY AT BEDTIME 11/10/21   [name], MD  ondansetron (ZOFRAN-ODT) 8 MG disintegrating tablet Take 1 tablet (8 mg total) by mouth every 8 (eight) hours as needed. 10/30/21   [name], MD  OXYGEN Inhale into the lungs as needed.    [provider]  pantoprazole (PROTONIX) 40 MG tablet Take 1 tablet (40 mg total) by mouth every morning. 10/22/21   [name], MD  promethazine (PHENERGAN) 12.5 MG suppository Place 12.5 mg rectally daily as needed for nausea/vomiting. 01/26/21   [provider]  QUEtiapine (SEROQUEL) 100 MG tablet Take 1 tablet (100 mg total) by mouth at bedtime for 28 days. 10/22/21 11/19/21  [name], MD    Physical Exam: Vitals:   11/20/21 2257 11/20/21 2300 11/20/21 2330 11/21/21 0000  BP: 122/84 122/84 113/87 115/73  Pulse: 95  95 100   Resp: _0 Temp: 98.1 F (36.7 C)     TempSrc: Oral     SpO2: 95%  95% 95%  Weight:      Height:        Physical Exam Vitals and nursing note reviewed.  Constitutional:      General: She is not in acute distress.    Appearance: She is not toxic-appearing.     Comments: Chronically ill-appearing, disheveled female.  Does not appear she has been bathed in several days.  HENT:     Head: Normocephalic and atraumatic.     Nose: Nose normal.  Cardiovascular:     Rate and Rhythm: Normal rate and regular rhythm.     Pulses: Normal pulses.  Pulmonary:     Breath sounds: Decreased breath sounds present. No wheezing or rhonchi.  Abdominal:     General: Bowel sounds are normal. There is no distension.     Palpations: Abdomen is soft.     Tenderness: There is no abdominal tenderness.  Musculoskeletal:     Right lower leg: No edema.     Left lower leg: No edema.  Skin:    General: Skin is warm and dry.     Capillary Refill: Capillary refill takes less than 2 seconds.  Neurological:     General: No focal deficit present.     Mental Status: She is alert and oriented to person, place, and time.      Labs on Admission: I have personally reviewed following labs and imaging studies  CBC: Recent Labs  Lab 11/20/21 2141  WBC 16.5*  NEUTROABS 13.6*  HGB 14.7  HCT 41.8  MCV 96.8  PLT 364   Basic Metabolic Panel: Recent Labs  Lab 11/20/21 2141  NA 134*  K 3.0*  CL 89*  CO2 28  GLUCOSE 132*  BUN 11  CREATININE 1.00  CALCIUM 7.7*   GFR: Estimated Creatinine Clearance: 51.6 mL/min (by C-G formula based on SCr of 1 mg/dL). Liver Function Tests: Recent Labs  Lab 11/20/21 2141  AST 393*  ALT 144*  ALKPHOS 155*  BILITOT 1.1  PROT 5.0*  ALBUMIN 2.1*   No results for input(s): "LIPASE", "AMYLASE"  in the last 168 hours. No results for input(s): "AMMONIA" in the last 168 hours. Coagulation Profile: Recent Labs  Lab 11/20/21 2141  INR 1.3*   Cardiac  Enzymes: Recent Labs  Lab 11/20/21 2141  CKTOTAL 47   BNP (last 3 results) No results for input(s): "PROBNP" in the last 8760 hours. HbA1C: No results for input(s): "HGBA1C" in the last 72 hours. CBG: No results for input(s): "GLUCAP" in the last 168 hours. Lipid Profile: No results for input(s): "CHOL", "HDL", "LDLCALC", "TRIG", "CHOLHDL", "LDLDIRECT" in the last 72 hours. Thyroid Function Tests: No results for input(s): "TSH", "T4TOTAL", "FREET4", "T3FREE", "THYROIDAB" in the last 72 hours. Anemia Panel: No results for input(s): "VITAMINB12", "FOLATE", "FERRITIN", "TIBC", "IRON", "RETICCTPCT" in the last 72 hours. Urine analysis:    Component Value Date/Time   COLORURINE YELLOW 07/27/2021 1032   APPEARANCEUR CLEAR 07/27/2021 1032   LABSPEC 1.008 07/27/2021 1032   PHURINE 7.0 07/27/2021 1032   GLUCOSEU NEGATIVE 07/27/2021 1032   HGBUR NEGATIVE 07/27/2021 1032   BILIRUBINUR NEGATIVE 07/27/2021 1032   BILIRUBINUR negative 08/23/2017 0936   BILIRUBINUR NEG 12/22/2015 1410   KETONESUR 5 (A) 07/27/2021 1032   PROTEINUR NEGATIVE 07/27/2021 1032   UROBILINOGEN 0.2 08/23/2017 0936   UROBILINOGEN 1.0 03/09/2013 1013   NITRITE NEGATIVE 07/27/2021 1032   LEUKOCYTESUR TRACE (A) 07/27/2021 1032    Radiological Exams on Admission: I have personally reviewed images CT ABDOMEN PELVIS W CONTRAST  Result Date: 11/20/2021 CLINICAL DATA:  Trauma and fall.  Right flank bruising. EXAM: CT CHEST, ABDOMEN, AND PELVIS WITH CONTRAST TECHNIQUE: Multidetector CT imaging of the chest, abdomen and pelvis was performed following the standard protocol during bolus administration of intravenous contrast. RADIATION DOSE REDUCTION: This exam was performed according to the departmental dose-optimization program which includes automated exposure control, adjustment of the mA and/or kV according to patient size and/or use of iterative reconstruction technique. CONTRAST:  119m OMNIPAQUE IOHEXOL 300 MG/ML  SOLN  COMPARISON:  Chest radiograph dated 11/20/2021 and CT dated 06/21/2021. CT abdomen pelvis dated 05/22/2021. FINDINGS: CT CHEST FINDINGS Cardiovascular: There is no cardiomegaly or pericardial effusion. Mild atherosclerotic calcification of the aortic arch. No aneurysmal dilatation or dissection. The central pulmonary arteries are unremarkable. Mediastinum/Nodes: No hilar or mediastinal adenopathy. Mild circumferential thickened appearing esophagus may be related to underdistention. Clinical correlation is recommended to evaluate for possibility of mild esophagitis. No mediastinal fluid collection or hematoma. Lungs/Pleura: Scattered small clusters of ground-glass subpleural densities throughout the lungs, new since the prior CT may represent atypical infection. Aspiration is less likely. Linear atelectasis in the lingula. No pleural effusion or pneumothorax. The central airways are patent. Musculoskeletal: Old compression fracture of T6 with complete loss of vertebral body height and associated 5 mm retropulsion of the posterior cortex similar to prior CT. There is focal narrowing of the central canal at this level. Old healed fracture of the sternal manubrium. Degenerative changes of spine and mild multilevel chronic compression injuries. There is acute appearing minimally displaced fracture of posterolateral right tenth rib (126/5). CT ABDOMEN PELVIS FINDINGS No intra-abdominal free air.  Trace perihepatic free fluid. Hepatobiliary: Fatty liver. No intrahepatic biliary ductal dilatation. The gallbladder is unremarkable. Pancreas: Unremarkable. No pancreatic ductal dilatation or surrounding inflammatory changes. Spleen: Normal in size without focal abnormality. Adrenals/Urinary Tract: The adrenal glands unremarkable. Subcentimeter left renal interpolar hypodense focus is too small to characterize. There is no hydronephrosis on either side. There is symmetric enhancement and excretion of contrast by both kidneys.  The visualized ureters and  the urinary bladder appear unremarkable. Stomach/Bowel: There is sigmoid diverticulosis without active inflammatory changes. There is no bowel obstruction or active inflammation. Appendectomy. Vascular/Lymphatic: Moderate aortoiliac atherosclerotic disease. The IVC is unremarkable. No portal venous gas. There is no adenopathy. Reproductive: The uterus and ovaries are grossly unremarkable. No adnexal masses. Other: None Musculoskeletal: Degenerative changes of the spine. No acute osseous pathology. IMPRESSION: 1. Acute appearing minimally displaced fracture of the posterolateral right tenth rib. No pneumothorax. 2. Scattered small clusters of subpleural ground-glass densities throughout the lungs, new since the prior CT may represent atypical infection. Aspiration is less likely. 3. Fatty liver. 4. Sigmoid diverticulosis. No bowel obstruction. 5. Aortic Atherosclerosis (ICD10-I70.0). Electronically Signed   By: Anner Crete M.D.   On: 11/20/2021 23:08   CT Chest W Contrast  Result Date: 11/20/2021 CLINICAL DATA:  Trauma and fall.  Right flank bruising. EXAM: CT CHEST, ABDOMEN, AND PELVIS WITH CONTRAST TECHNIQUE: Multidetector CT imaging of the chest, abdomen and pelvis was performed following the standard protocol during bolus administration of intravenous contrast. RADIATION DOSE REDUCTION: This exam was performed according to the departmental dose-optimization program which includes automated exposure control, adjustment of the mA and/or kV according to patient size and/or use of iterative reconstruction technique. CONTRAST:  178m OMNIPAQUE IOHEXOL 300 MG/ML  SOLN COMPARISON:  Chest radiograph dated 11/20/2021 and CT dated 06/21/2021. CT abdomen pelvis dated 05/22/2021. FINDINGS: CT CHEST FINDINGS Cardiovascular: There is no cardiomegaly or pericardial effusion. Mild atherosclerotic calcification of the aortic arch. No aneurysmal dilatation or dissection. The central pulmonary  arteries are unremarkable. Mediastinum/Nodes: No hilar or mediastinal adenopathy. Mild circumferential thickened appearing esophagus may be related to underdistention. Clinical correlation is recommended to evaluate for possibility of mild esophagitis. No mediastinal fluid collection or hematoma. Lungs/Pleura: Scattered small clusters of ground-glass subpleural densities throughout the lungs, new since the prior CT may represent atypical infection. Aspiration is less likely. Linear atelectasis in the lingula. No pleural effusion or pneumothorax. The central airways are patent. Musculoskeletal: Old compression fracture of T6 with complete loss of vertebral body height and associated 5 mm retropulsion of the posterior cortex similar to prior CT. There is focal narrowing of the central canal at this level. Old healed fracture of the sternal manubrium. Degenerative changes of spine and mild multilevel chronic compression injuries. There is acute appearing minimally displaced fracture of posterolateral right tenth rib (126/5). CT ABDOMEN PELVIS FINDINGS No intra-abdominal free air.  Trace perihepatic free fluid. Hepatobiliary: Fatty liver. No intrahepatic biliary ductal dilatation. The gallbladder is unremarkable. Pancreas: Unremarkable. No pancreatic ductal dilatation or surrounding inflammatory changes. Spleen: Normal in size without focal abnormality. Adrenals/Urinary Tract: The adrenal glands unremarkable. Subcentimeter left renal interpolar hypodense focus is too small to characterize. There is no hydronephrosis on either side. There is symmetric enhancement and excretion of contrast by both kidneys. The visualized ureters and the urinary bladder appear unremarkable. Stomach/Bowel: There is sigmoid diverticulosis without active inflammatory changes. There is no bowel obstruction or active inflammation. Appendectomy. Vascular/Lymphatic: Moderate aortoiliac atherosclerotic disease. The IVC is unremarkable. No portal  venous gas. There is no adenopathy. Reproductive: The uterus and ovaries are grossly unremarkable. No adnexal masses. Other: None Musculoskeletal: Degenerative changes of the spine. No acute osseous pathology. IMPRESSION: 1. Acute appearing minimally displaced fracture of the posterolateral right tenth rib. No pneumothorax. 2. Scattered small clusters of subpleural ground-glass densities throughout the lungs, new since the prior CT may represent atypical infection. Aspiration is less likely. 3. Fatty liver. 4. Sigmoid diverticulosis. No bowel obstruction.  5. Aortic Atherosclerosis (ICD10-I70.0). Electronically Signed   By: Anner Crete M.D.   On: 11/20/2021 23:08   CT HEAD WO CONTRAST (5MM)  Result Date: 11/20/2021 CLINICAL DATA:  Head trauma, intracranial arterial injury suspected EXAM: CT HEAD WITHOUT CONTRAST TECHNIQUE: Contiguous axial images were obtained from the base of the skull through the vertex without intravenous contrast. RADIATION DOSE REDUCTION: This exam was performed according to the departmental dose-optimization program which includes automated exposure control, adjustment of the mA and/or kV according to patient size and/or use of iterative reconstruction technique. COMPARISON:  CT head 09/09/2020 BRAIN: BRAIN Cerebral ventricle sizes are concordant with the degree of cerebral volume loss. Trace patchy areas of decreased attenuation are noted throughout the deep and periventricular white matter of the cerebral hemispheres bilaterally, compatible with chronic microvascular ischemic disease. No evidence of large-territorial acute infarction. No parenchymal hemorrhage. No mass lesion. No extra-axial collection. No mass effect or midline shift. No hydrocephalus. Basilar cisterns are patent. Vascular: No hyperdense vessel. Skull: No acute fracture or focal lesion. Sinuses/Orbits: Paranasal sinuses and mastoid air cells are clear. The orbits are unremarkable. Other: None. IMPRESSION: No acute  intracranial abnormality. Electronically Signed   By: Iven Finn M.D.   On: 11/20/2021 22:55   DG Pelvis Portable  Result Date: 11/20/2021 CLINICAL DATA:  Fall, right hip pain EXAM: PORTABLE PELVIS 1-2 VIEWS COMPARISON:  None Available. FINDINGS: There is no evidence of pelvic fracture or diastasis. No pelvic bone lesions are seen. IMPRESSION: Negative. Electronically Signed   By: Fidela Salisbury M.D.   On: 11/20/2021 22:34   DG Chest Port 1 View  Result Date: 11/20/2021 CLINICAL DATA:  Fall, weakness EXAM: PORTABLE CHEST 1 VIEW COMPARISON:  06/21/2021 FINDINGS: Linear atelectasis or scarring within the left mid lung zone. Lungs are otherwise clear. No pneumothorax or pleural effusion. Cardiac size within normal limits. Pulmonary vascularity is normal. No acute bone abnormality. IMPRESSION: No active disease. Electronically Signed   By: Fidela Salisbury M.D.   On: 11/20/2021 22:31    EKG: My personal interpretation of EKG shows: sinus tachycardia    Assessment/Plan Principal Problem:   Right rib fracture Active Problems:   Elevated LFTs   Tobacco abuse   Alcohol use   Paroxysmal atrial fibrillation (HCC)   Chronic obstructive pulmonary disease, group B, by Global Initiative for Chronic Obstructive Lung Disease 2017 classification (HCC)   Hypokalemia    Assessment and Plan: * Right rib fracture Observation medical bed. EDP discussed case with trauma surgery who declined admission. I do not believe pt has pneumonia at this point. Minimal opacities on CT chest. WBC likely due to lying in bed for 3 days along with alcohol use yesterday and minimal oral intake. Pt given 1 dose of rocephin/zithromax. Will check procalcitonin. If not severely elevated, do not restart abx.  Would except slightly elevated of procal with her recent bedridden status.  PCP notes that pt has refused SNF placement multiple times in the past. Prn oxycodone 2.5 mg for pain. Avoiding tylenol due to elevated LFTs.  Right  rib fracture is a Acute illness/condition that poses a threat to life or bodily function.   Elevated LFTs New. Pt recently started Seroquel last month. PCP refusing to refill pt's clonazepam due to prior hx of benzo abuse. Will stop seroquel. EDP has ordered hepatitis panel. Repeat CMP in AM.  Hypokalemia Acute. Getting IV potassium replacement. Repeat BMP in AM. Will give 2 gram IV mag.  Chronic obstructive pulmonary disease, group B, by  Global Initiative for Chronic Obstructive Lung Disease 2017 classification (HCC) Stable.  Paroxysmal atrial fibrillation (HCC) Stable. In NSR. Pt at high fall risk making systemic anticoagulation a contraindication.  Alcohol use Pt states she drank 3-4 wine coolers 2-3 days ago. Same timeline when she fell out of bed. Low risk for alcohol withdrawal. Given recent discontinuation of clonazepam by her PCP will not start CIWA. Will continue her on her neurontin 600 mg tid which would also protect her for any potential etoh withdrawal seizures.  Appears that PCP has worked hard over last several years to taper pt off benzos. Would not want to jeopardize PCP hard work by restarting benzos.  Tobacco abuse Chronic. Pt requesting nicotine patch. Will order 21 mg.   DVT prophylaxis: SQ Heparin Code Status: Full Code Family Communication: no family at bedside  Disposition Plan: return home  Consults called: none  Admission status: Observation, Med-Surg   Kristopher Oppenheim, DO [facility] 11/21/2021, 12:17 AM

## 2021-11-21 NOTE — Assessment & Plan Note (Signed)
Stable. In NSR. Pt at high fall risk making systemic anticoagulation a contraindication.

## 2021-11-21 NOTE — Assessment & Plan Note (Signed)
Stable

## 2021-11-21 NOTE — Assessment & Plan Note (Signed)
Observation medical bed. EDP discussed case with trauma surgery who declined admission. I do not believe pt has pneumonia at this point. Minimal opacities on CT chest. WBC likely due to lying in bed for 3 days along with alcohol use yesterday and minimal oral intake. Pt given 1 dose of rocephin/zithromax. Will check procalcitonin. If not severely elevated, do not restart abx.  Would except slightly elevated of procal with her recent bedridden status.  PCP notes that pt has refused SNF placement multiple times in the past.

## 2021-11-21 NOTE — Subjective & Objective (Signed)
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  Per triage note, patient was found in urine and feces when EMS arrived to her house.  Patient states that she has been laying in bed for the last 3 days.  She reports alcohol use a couple days ago when she fell out of bed and onto her right side.  On arrival temp 97.8, heart rate 112, blood pressure 139/91 satting 94% on room air.  Labs: Sodium 134, potassium 3.0, bicarb 89, BUN of 11, creatinine 1.0, AST 393, ALT of 144, alk phos of 155  Initial lactic acid 6.4 Repeat lactic acid pending CK 47 Alcohol level less than 10  White count 16.5, hemoglobin 14.7, platelets of 233  CT chest which I have personally interpreted and reviewed shows no large-scale pneumonia.  She has some small cysts groundglass opacities near the diaphragm.  She has a minimally displaced right 10th rib fracture.  Patient given IV fluids in the ER.  EDP discussed the patient's case with trauma surgery due to the patient's rib fracture and history of fall.  Trauma surgery was declined admission.  [facility] hospitalist contacted for admission.

## 2021-11-21 NOTE — Evaluation (Signed)
Physical Therapy Evaluation Patient Details Name: Karen Casey MRN: [id number] DOB: April 27, 1956 Today's Date: 11/21/2021  History of Present Illness  Pt admitted from home with N/V/D and recent fall with minimally displaced R tenth rib.  Pt with hx of COPD, peripheral neuropathy, seizure and poly-substance abuse.  Clinical Impression  Pt admitted as above and presenting with functional mobility limitations 2* generalized weakness, pain associated with R rib fx, balance deficits, limited endurance and cognitive deficits.  Pt limited to EOB sitting this date and unable to achieve standing.  Pt would benefit from follow up rehab at SNF level to maximize IND and safety.     Recommendations for follow up therapy are one component of a multi-disciplinary discharge planning process, led by the attending physician.  Recommendations may be updated based on patient status, additional functional criteria and insurance authorization.  Follow Up Recommendations Skilled nursing-short term rehab (<3 hours/day)    Assistance Recommended at Discharge Frequent or constant Supervision/Assistance  Patient can return home with the following  Two people to help with walking and/or transfers;A lot of help with bathing/dressing/bathroom;Assistance with cooking/housework;Assist for transportation;Help with stairs or ramp for entrance    Equipment Recommendations None recommended by PT  Recommendations for Other Services       Functional Status Assessment Patient has had a recent decline in their functional status and demonstrates the ability to make significant improvements in function in a reasonable and predictable amount of time.     Precautions / Restrictions Precautions Precautions: Fall Restrictions Weight Bearing Restrictions: No      Mobility  Bed Mobility Overal bed mobility: Needs Assistance Bed Mobility: Rolling, Supine to Sit, Sit to Supine Rolling: Supervision   Supine to sit:  Supervision Sit to supine: Supervision   General bed mobility comments: No physical assist needed    Transfers Overall transfer level: Needs assistance Equipment used: Rolling walker (2 wheels) Transfers: Sit to/from Stand             General transfer comment: Standing attempted from EOB x 2 but pt unable to complete.  Pt states "I can't do it, I'm too weak"    Ambulation/Gait               General Gait Details: Pt attempted standing x 2 but unable to attain stanind and refuses further attempts  Stairs            Wheelchair Mobility    Modified Rankin (Stroke Patients Only)       Balance Overall balance assessment: Needs assistance Sitting-balance support: No upper extremity supported, Feet supported Sitting balance-Leahy Scale: Good                                       Pertinent Vitals/Pain Pain Assessment Pain Assessment: Faces Faces Pain Scale: Hurts little more Pain Location: Right back Pain Descriptors / Indicators: Grimacing    Home Living Family/patient expects to be discharged to:: Unsure Living Arrangements: Spouse/significant other Available Help at Discharge: Family;Available 24 hours/day Type of Home: House Home Access: Ramped entrance       Home Layout: One level Home Equipment: Conservation officer, nature (2 wheels);BSC/3in1 Additional Comments: Information taken from prior chart - pt is poor historian    Prior Function Prior Level of Function : Needs assist             Mobility Comments: Pt states has not been ambulating at  home but just stays in bed ADLs Comments: Pt states does not get up to bathroom "I just go in the bed"     Hand Dominance   Dominant Hand: Right    Extremity/Trunk Assessment   Upper Extremity Assessment Upper Extremity Assessment: Generalized weakness    Lower Extremity Assessment Lower Extremity Assessment: Generalized weakness    Cervical / Trunk Assessment Cervical / Trunk  Assessment: Normal  Communication   Communication: No difficulties  Cognition Arousal/Alertness: Awake/alert Behavior During Therapy: Impulsive, Anxious Overall Cognitive Status: No family/caregiver present to determine baseline cognitive functioning                                 General Comments: Pt oriented to self and initially states she is here because of her nausea but on sitting up to EOB, looks around and states "this isn't my place, where am I".  Pt reoriented to place and situation        General Comments      Exercises     Assessment/Plan    PT Assessment Patient needs continued PT services  PT Problem List Decreased strength;Decreased activity tolerance;Decreased balance;Decreased mobility;Decreased cognition;Decreased knowledge of use of DME       PT Treatment Interventions DME instruction;Gait training;Functional mobility training;Therapeutic activities;Therapeutic exercise;Patient/family education;Cognitive remediation;Balance training    PT Goals (Current goals can be found in the Care Plan section)  Acute Rehab PT Goals Patient Stated Goal: Back to bed PT Goal Formulation: Patient unable to participate in goal setting Time For Goal Achievement: 12/05/21 Potential to Achieve Goals: Fair    Frequency Min 2X/week     Co-evaluation               AM-PAC PT "6 Clicks" Mobility  Outcome Measure Help needed turning from your back to your side while in a flat bed without using bedrails?: A Little Help needed moving from lying on your back to sitting on the side of a flat bed without using bedrails?: A Little Help needed moving to and from a bed to a chair (including a wheelchair)?: A Little Help needed standing up from a chair using your arms (e.g., wheelchair or bedside chair)?: A Lot Help needed to walk in hospital room?: Total Help needed climbing 3-5 steps with a railing? : Total 6 Click Score: 13    End of Session Equipment  Utilized During Treatment: Gait belt Activity Tolerance: Patient limited by fatigue Patient left: in bed;with call bell/phone within reach;with bed alarm set Nurse Communication: Mobility status PT Visit Diagnosis: Muscle weakness (generalized) (M62.81);Difficulty in walking, not elsewhere classified (R26.2);History of falling (Z91.81)    Time: 4431-5400 PT Time Calculation (min) (ACUTE ONLY): 20 min   Charges:   PT Evaluation $PT Eval Low Complexity: [place] [facility] Pager [phone] Office [phone]   Abenezer Odonell 11/21/2021, 5:12 PM

## 2021-11-21 NOTE — Assessment & Plan Note (Signed)
Acute. Getting IV potassium replacement. Repeat BMP in AM. Will give 2 gram IV mag.

## 2021-11-21 NOTE — Progress Notes (Signed)
*   Right rib fracture Right rib fracture is a Acute illness/condition that poses a threat to life or bodily function. EDP discussed case with trauma surgery who declined admission. Incentive spirometer Pain control, Prn oxycodone 2.5 mg for pain. Avoiding tylenol due to elevated LFTs.  FTT: PCP notes that pt has refused SNF placement multiple times in the past.  Apparently husband told EMS he can no longer care for her at home.    CT chest : Scattered small clusters of subpleural ground-glass densities throughout the lungs, new since the prior CT may represent atypical infection. Aspiration is less likely. Procalcitonin 10.8 Will continue abx,  rocephin/zithromax.  Reported productive cough, sputum sample for culture      Abnormal ua, sent urine culture Continue abx  Elevated wbc     Elevated LFTs Hepatitis panel in process CK unremarkable Alcohol level less than 10 CT ab showed :Fatty liver. No intrahepatic biliary ductal dilatation. The gallbladder is unremarkable. Prior ab [place] from 7/24 showed hepatic steatosis, no gallstones Avoid hepatic toxic, hold seroquel ( Pt recently started Seroquel last month. PCP refusing to refill pt's clonazepam due to prior hx of benzo abuse.) Monitor LFT   Hypokalemia Acute. Getting IV potassium replacement. Repeat BMP in AM. Will give 2 gram IV mag.   Chronic obstructive pulmonary disease, group B, by Global Initiative for Chronic Obstructive Lung Disease 2017 classification ([place]) Stable. Appear to be on home 02   Paroxysmal atrial fibrillation (HCC) Stable. In NSR. Pt at high fall risk making systemic anticoagulation a contraindication.   Alcohol use Pt states she drank 3-4 wine coolers 2-3 days ago. Same timeline when she fell out of bed. Low risk for alcohol withdrawal. Given recent discontinuation of clonazepam by her PCP will not start CIWA. Will continue her on her neurontin 600 mg tid which would also protect her for any potential  etoh withdrawal seizures.  Appears that PCP has worked hard over last several years to taper pt off benzos. Would not want to jeopardize PCP hard work by restarting benzos.   Tobacco abuse Chronic. Pt requesting nicotine patch. Will order 21 mg.

## 2021-11-21 NOTE — Assessment & Plan Note (Signed)
Chronic. Pt requesting nicotine patch. Will order 21 mg.

## 2021-11-22 ENCOUNTER — Inpatient Hospital Stay (HOSPITAL_COMMUNITY): Payer: Medicare Other

## 2021-11-22 ENCOUNTER — Observation Stay (HOSPITAL_COMMUNITY): Payer: Medicare Other

## 2021-11-22 ENCOUNTER — Encounter (HOSPITAL_COMMUNITY): Payer: 59

## 2021-11-22 DIAGNOSIS — I4892 Unspecified atrial flutter: Secondary | ICD-10-CM | POA: Diagnosis not present

## 2021-11-22 DIAGNOSIS — K700 Alcoholic fatty liver: Secondary | ICD-10-CM | POA: Diagnosis present

## 2021-11-22 DIAGNOSIS — G629 Polyneuropathy, unspecified: Secondary | ICD-10-CM | POA: Diagnosis present

## 2021-11-22 DIAGNOSIS — I5032 Chronic diastolic (congestive) heart failure: Secondary | ICD-10-CM | POA: Diagnosis present

## 2021-11-22 DIAGNOSIS — Z789 Other specified health status: Secondary | ICD-10-CM | POA: Diagnosis not present

## 2021-11-22 DIAGNOSIS — E876 Hypokalemia: Secondary | ICD-10-CM | POA: Diagnosis present

## 2021-11-22 DIAGNOSIS — F1010 Alcohol abuse, uncomplicated: Secondary | ICD-10-CM | POA: Diagnosis present

## 2021-11-22 DIAGNOSIS — I480 Paroxysmal atrial fibrillation: Secondary | ICD-10-CM | POA: Diagnosis present

## 2021-11-22 DIAGNOSIS — E871 Hypo-osmolality and hyponatremia: Secondary | ICD-10-CM | POA: Diagnosis present

## 2021-11-22 DIAGNOSIS — J690 Pneumonitis due to inhalation of food and vomit: Principal | ICD-10-CM

## 2021-11-22 DIAGNOSIS — Z9981 Dependence on supplemental oxygen: Secondary | ICD-10-CM | POA: Diagnosis not present

## 2021-11-22 DIAGNOSIS — J9601 Acute respiratory failure with hypoxia: Secondary | ICD-10-CM

## 2021-11-22 DIAGNOSIS — W06XXXA Fall from bed, initial encounter: Secondary | ICD-10-CM | POA: Diagnosis present

## 2021-11-22 DIAGNOSIS — Y92003 Bedroom of unspecified non-institutional (private) residence as the place of occurrence of the external cause: Secondary | ICD-10-CM | POA: Diagnosis not present

## 2021-11-22 DIAGNOSIS — G8929 Other chronic pain: Secondary | ICD-10-CM | POA: Diagnosis present

## 2021-11-22 DIAGNOSIS — I110 Hypertensive heart disease with heart failure: Secondary | ICD-10-CM | POA: Diagnosis present

## 2021-11-22 DIAGNOSIS — I471 Supraventricular tachycardia: Secondary | ICD-10-CM | POA: Diagnosis present

## 2021-11-22 DIAGNOSIS — G9341 Metabolic encephalopathy: Secondary | ICD-10-CM | POA: Diagnosis present

## 2021-11-22 DIAGNOSIS — S2231XA Fracture of one rib, right side, initial encounter for closed fracture: Secondary | ICD-10-CM

## 2021-11-22 DIAGNOSIS — I482 Chronic atrial fibrillation, unspecified: Secondary | ICD-10-CM | POA: Diagnosis present

## 2021-11-22 DIAGNOSIS — Z8249 Family history of ischemic heart disease and other diseases of the circulatory system: Secondary | ICD-10-CM | POA: Diagnosis not present

## 2021-11-22 DIAGNOSIS — K560 Paralytic ileus: Secondary | ICD-10-CM | POA: Diagnosis present

## 2021-11-22 DIAGNOSIS — J441 Chronic obstructive pulmonary disease with (acute) exacerbation: Secondary | ICD-10-CM | POA: Diagnosis present

## 2021-11-22 DIAGNOSIS — K219 Gastro-esophageal reflux disease without esophagitis: Secondary | ICD-10-CM | POA: Diagnosis present

## 2021-11-22 DIAGNOSIS — R0789 Other chest pain: Secondary | ICD-10-CM | POA: Diagnosis present

## 2021-11-22 DIAGNOSIS — I959 Hypotension, unspecified: Secondary | ICD-10-CM | POA: Diagnosis not present

## 2021-11-22 DIAGNOSIS — F17210 Nicotine dependence, cigarettes, uncomplicated: Secondary | ICD-10-CM | POA: Diagnosis present

## 2021-11-22 LAB — COMPREHENSIVE METABOLIC PANEL
ALT: 84 U/L — ABNORMAL HIGH (ref 0–44)
AST: 100 U/L — ABNORMAL HIGH (ref 15–41)
Albumin: 1.9 g/dL — ABNORMAL LOW (ref 3.5–5.0)
Alkaline Phosphatase: 153 U/L — ABNORMAL HIGH (ref 38–126)
Anion gap: 8 (ref 5–15)
BUN: 8 mg/dL (ref 8–23)
CO2: 28 mmol/L (ref 22–32)
Calcium: 7.5 mg/dL — ABNORMAL LOW (ref 8.9–10.3)
Chloride: 97 mmol/L — ABNORMAL LOW (ref 98–111)
Creatinine, Ser: 0.68 mg/dL (ref 0.44–1.00)
GFR, Estimated: >60 mL/min (ref >60)
Glucose, Bld: 124 mg/dL — ABNORMAL HIGH (ref 70–99)
Potassium: 3.1 mmol/L — ABNORMAL LOW (ref 3.5–5.1)
Sodium: 133 mmol/L — ABNORMAL LOW (ref 135–145)
Total Bilirubin: 0.9 mg/dL (ref 0.3–1.2)
Total Protein: 4.7 g/dL — ABNORMAL LOW (ref 6.5–8.1)

## 2021-11-22 LAB — TROPONIN I (HIGH SENSITIVITY): Troponin I (High Sensitivity): 10 ng/L (ref <18)

## 2021-11-22 LAB — URINE CULTURE

## 2021-11-22 LAB — BLOOD GAS, ARTERIAL
Acid-Base Excess: 2.7 mmol/L — ABNORMAL HIGH (ref 0.0–2.0)
Bicarbonate: 27.9 mmol/L (ref 20.0–28.0)
O2 Saturation: 87.2 %
Patient temperature: 37
pCO2 arterial: 44 mmHg (ref 32–48)
pH, Arterial: 7.41 (ref 7.35–7.45)
pO2, Arterial: 56 mmHg — ABNORMAL LOW (ref 83–108)

## 2021-11-22 LAB — LACTIC ACID, PLASMA
Lactic Acid, Venous: 2.8 mmol/L (ref 0.5–1.9)
Lactic Acid, Venous: 2.8 mmol/L (ref 0.5–1.9)

## 2021-11-22 LAB — CBC
HCT: 40.3 % (ref 36.0–46.0)
Hemoglobin: 14.1 g/dL (ref 12.0–15.0)
MCH: 33.8 pg (ref 26.0–34.0)
MCHC: 35 g/dL (ref 30.0–36.0)
MCV: 96.6 fL (ref 80.0–100.0)
Platelets: 209 10*3/uL (ref 150–400)
RBC: 4.17 MIL/uL (ref 3.87–5.11)
RDW: 12.6 % (ref 11.5–15.5)
WBC: 17.2 10*3/uL — ABNORMAL HIGH (ref 4.0–10.5)
nRBC: 0 % (ref 0.0–0.2)

## 2021-11-22 LAB — BASIC METABOLIC PANEL
Anion gap: 10 (ref 5–15)
BUN: 9 mg/dL (ref 8–23)
CO2: 25 mmol/L (ref 22–32)
Calcium: 7.5 mg/dL — ABNORMAL LOW (ref 8.9–10.3)
Chloride: 98 mmol/L (ref 98–111)
Creatinine, Ser: 0.75 mg/dL (ref 0.44–1.00)
GFR, Estimated: >60 mL/min (ref >60)
Glucose, Bld: 110 mg/dL — ABNORMAL HIGH (ref 70–99)
Potassium: 4.1 mmol/L (ref 3.5–5.1)
Sodium: 133 mmol/L — ABNORMAL LOW (ref 135–145)

## 2021-11-22 LAB — MAGNESIUM: Magnesium: 1.8 mg/dL (ref 1.7–2.4)

## 2021-11-22 LAB — LIPASE, BLOOD: Lipase: 20 U/L (ref 11–51)

## 2021-11-22 MED ORDER — REVEFENACIN 175 MCG/3ML IN SOLN
175.0000 ug | Freq: Every day | RESPIRATORY_TRACT | Status: DC
  Administered 2021-11-23 – 2021-12-01 (×9): 175 ug via RESPIRATORY_TRACT
  Filled 2021-11-22 (×9): qty 3

## 2021-11-22 MED ORDER — IPRATROPIUM-ALBUTEROL 0.5-2.5 (3) MG/3ML IN SOLN
3.0000 mL | Freq: Four times a day (QID) | RESPIRATORY_TRACT | Status: DC
  Administered 2021-11-22 (×3): 3 mL via RESPIRATORY_TRACT
  Filled 2021-11-22 (×2): qty 3

## 2021-11-22 MED ORDER — IPRATROPIUM-ALBUTEROL 0.5-2.5 (3) MG/3ML IN SOLN
3.0000 mL | RESPIRATORY_TRACT | Status: DC | PRN
Start: 2021-11-22 — End: 2021-12-01
  Administered 2021-11-23 – 2021-11-27 (×3): 3 mL via RESPIRATORY_TRACT
  Filled 2021-11-22 (×3): qty 3

## 2021-11-22 MED ORDER — BISACODYL 10 MG RE SUPP
10.0000 mg | Freq: Once | RECTAL | Status: AC
Start: 2021-11-22 — End: 2021-11-22
  Administered 2021-11-22: 10 mg via RECTAL
  Filled 2021-11-22: qty 1

## 2021-11-22 MED ORDER — POTASSIUM CHLORIDE 10 MEQ/100ML IV SOLN
10.0000 meq | INTRAVENOUS | Status: AC
  Administered 2021-11-22 (×6): 10 meq via INTRAVENOUS
  Filled 2021-11-22 (×6): qty 100

## 2021-11-22 MED ORDER — SODIUM CHLORIDE 0.9 % IV BOLUS
1000.0000 mL | Freq: Once | INTRAVENOUS | Status: AC
Start: 2021-11-22 — End: 2021-11-22
  Administered 2021-11-22: 1000 mL via INTRAVENOUS

## 2021-11-22 MED ORDER — IOHEXOL 350 MG/ML SOLN
80.0000 mL | Freq: Once | INTRAVENOUS | Status: AC | PRN
Start: 2021-11-22 — End: 2021-11-22
  Administered 2021-11-22: 80 mL via INTRAVENOUS

## 2021-11-22 MED ORDER — FUROSEMIDE 10 MG/ML IJ SOLN
40.0000 mg | Freq: Once | INTRAMUSCULAR | Status: AC
Start: 2021-11-22 — End: 2021-11-22
  Administered 2021-11-22: 40 mg via INTRAVENOUS
  Filled 2021-11-22: qty 4

## 2021-11-22 MED ORDER — METOPROLOL TARTRATE 5 MG/5ML IV SOLN
5.0000 mg | Freq: Four times a day (QID) | INTRAVENOUS | Status: DC
  Administered 2021-11-22 – 2021-11-24 (×5): 5 mg via INTRAVENOUS
  Filled 2021-11-22 (×6): qty 5

## 2021-11-22 MED ORDER — METOPROLOL TARTRATE 5 MG/5ML IV SOLN
5.0000 mg | Freq: Once | INTRAVENOUS | Status: AC
  Administered 2021-11-22: 5 mg via INTRAVENOUS
  Filled 2021-11-22: qty 5

## 2021-11-22 MED ORDER — ARFORMOTEROL TARTRATE 15 MCG/2ML IN NEBU
15.0000 ug | INHALATION_SOLUTION | Freq: Two times a day (BID) | RESPIRATORY_TRACT | Status: DC
  Administered 2021-11-23 – 2021-12-01 (×17): 15 ug via RESPIRATORY_TRACT
  Filled 2021-11-22 (×17): qty 2

## 2021-11-22 MED ORDER — CHLORHEXIDINE GLUCONATE CLOTH 2 % EX PADS
6.0000 | MEDICATED_PAD | Freq: Every day | CUTANEOUS | Status: DC
  Administered 2021-11-22 – 2021-12-01 (×10): 6 via TOPICAL

## 2021-11-22 MED ORDER — LACTATED RINGERS IV SOLN
INTRAVENOUS | Status: DC
Start: 2021-11-22 — End: 2021-11-23

## 2021-11-22 NOTE — Progress Notes (Signed)
Patient transported to ICU post rapid response. She was placed on NRB 100% for transport and changed to 12L [place] with NRB after arriving to ICU. Patient saturation is remaining at 90%. RT will continue to monitor

## 2021-11-22 NOTE — Plan of Care (Signed)
  Problem: Clinical Measurements: Goal: Respiratory complications will improve Outcome: Progressing   Problem: Clinical Measurements: Goal: Cardiovascular complication will be avoided Outcome: Progressing   Problem: Elimination: Goal: Will not experience complications related to bowel motility Outcome: Progressing   Problem: Skin Integrity: Goal: Risk for impaired skin integrity will decrease Outcome: Progressing   

## 2021-11-22 NOTE — Consult Note (Signed)
NAME:  Karen Casey, MRN:  [id number], DOB:  10/24/1955, LOS: 0 ADMISSION DATE:  11/20/2021, CONSULTATION DATE:  11/22/21 REFERRING MD:  Dr. Erlinda Hong - [facility], CHIEF COMPLAINT:  SOB   History of Present Illness:  66 y.o. woman admitted to the hospital with rib pain.  Fell 3 days prior.  EMS was called to house and patient was laying in feces.  Reportedly lying in bed for 3 days.  Was drinking and felt 3 days prior to not move since.  [place], creatinine relatively stable.  Chest x-ray demonstrated scattered nodular opacities.  CT scan was obtained showed scattered groundglass nodular opacities.  Placed on antibiotic for concern of pneumonia.  Patient has been nauseated with vomiting.  Reportedly preceded admission.  Certainly present last couple days.  Rapid response called 11/22/2021 AM in setting of hypoxemia, increased work of breathing.  Placed on nonrebreather at [place].  Brought to the stepdown unit.  On evaluation, mildly tachypneic.  Tachypnea gradually improved.  Nonrebreather was able to be taken off with O2 sats in the low to mid 90s on 15 L [place].  Chest x-ray obtained showed similar findings to prior on my review interpretation.  Abdominal film concerning for dilated bowel loops, possible ileus?  Pertinent  Medical History  Alcohol abuse, severe to moderate COPD on PFTs earlier in 2023  Significant Hospital Events: Including procedures, antibiotic start and stop dates in addition to other pertinent events   11/20/21 admitted to hospital after a fall at home oxygen alcohol, found in feces, reportedly on 3 days, treated for pneumonia  Interim History / Subjective:  As above  Objective   Blood pressure (!) 148/87, pulse (!) 125, temperature (!) 97.2 F (36.2 C), temperature source Axillary, resp. rate (!) 32, height '5\' 3"'$  (1.6 m), weight 71.2 kg, SpO2 (!) 88 %.    FiO2 (%):  [32 %-100 %] 100 %   Intake/Output Summary (Last 24 hours) at 11/22/2021 1209 Last data filed at 11/22/2021  0756 Gross per 24 hour  Intake 2174.12 ml  Output 1 ml  Net 2173.12 ml   Filed Weights   11/20/21 2056 11/22/21 0601  Weight: 63.5 kg 71.2 kg    Examination: General: Chronically ill-appearing, lying in bed Eyes: EOMI, no icterus Neck: Supple, mild increased use of neck muscles for breathing but subsequently improved on reevaluation Lungs: Crackles in the bases, distant Cardiovascular: Tachycardic, irregularly irregular Abdomen: Nondistended, bowel sounds present Extremities: Trace edema, warm Neuro: Moves all extremities, sensation intact Psych: Normal mood, full affect  Resolved Hospital Problem list     Assessment & Plan:  Acute hypoxemic respiratory failure: In the setting of ongoing nausea and vomiting with acute decompensation.  High suspicion for aspiration pneumonitis.  Chest x-ray largely unchanged.  No consideration includes pulmonary edema given she is net positive and in atrial fibrillation. --Diuresis, 40 mg IV Lasix now assess response --Continue antibiotics for community-acquired pneumonia coverage --Given worsening tachycardia and hypoxemia relatively unchanged chest x-ray, CTA PE protocol ordered, has been on chemoprophylaxis for DVT given she was down for a couple days increases her risk for VTE given reported immobility  Leg swelling, suspect related to mild volume overload. --Diuresis --Lower extremity Dopplers ordered  Best Practice (right click and "Reselect all SmartList Selections" daily)   Per primary  Labs   CBC: Recent Labs  Lab 11/20/21 2141 11/21/21 0410 11/22/21 0321  WBC 16.5* 12.0* 17.2*  NEUTROABS 13.6* 9.5*  --   HGB 14.7 12.7 14.1  HCT 41.8 36.6 40.3  MCV 96.8 98.9 96.6  PLT 233 170 563    Basic Metabolic Panel: Recent Labs  Lab 11/20/21 2141 11/21/21 0410 11/22/21 0321 11/22/21 0958  NA 134* 136 133*  --   K 3.0* 3.0* 3.1*  --   CL 89* 98 97*  --   CO2 '28 27 28  '$ --   GLUCOSE 132* 110* 124*  --   BUN '11 9 8  '$ --    CREATININE 1.00 0.83 0.68  --   CALCIUM 7.7* 7.0* 7.5*  --   MG  --  1.9  --  1.8   GFR: Estimated Creatinine Clearance: 68.1 mL/min (by C-G formula based on SCr of 0.68 mg/dL). Recent Labs  Lab 11/20/21 2141 11/20/21 2152 11/20/21 2318 11/21/21 0410 11/22/21 0321 11/22/21 1003  PROCALCITON  --   --   --  10.80  --   --   WBC 16.5*  --   --  12.0* 17.2*  --   LATICACIDVEN  --  6.4* 6.4*  --   --  2.8*    Liver Function Tests: Recent Labs  Lab 11/20/21 2141 11/21/21 0410 11/22/21 0321  AST 393* 266* 100*  ALT 144* 110* 84*  ALKPHOS 155* 131* 153*  BILITOT 1.1 0.8 0.9  PROT 5.0* 4.5* 4.7*  ALBUMIN 2.1* 1.9* 1.9*   Recent Labs  Lab 11/22/21 0958  LIPASE 20   No results for input(s): "AMMONIA" in the last 168 hours.  ABG    Component Value Date/Time   PHART 7.41 11/22/2021 0824   PCO2ART 44 11/22/2021 0824   PO2ART 56 (L) 11/22/2021 0824   HCO3 27.9 11/22/2021 0824   TCO2 28 09/12/2020 1855   O2SAT 87.2 11/22/2021 0824     Coagulation Profile: Recent Labs  Lab 11/20/21 2141  INR 1.3*    Cardiac Enzymes: Recent Labs  Lab 11/20/21 2141  CKTOTAL 47    HbA1C: Hgb A1c MFr Bld  Date/Time Value Ref Range Status  09/17/2020 02:08 AM 5.2 4.8 - 5.6 % Final    Comment:    (NOTE) Pre diabetes:          5.7%-6.4%  Diabetes:              >6.4%  Glycemic control for   <7.0% adults with diabetes   06/20/2020 02:31 PM 5.3 4.8 - 5.6 % Final    Comment:             Prediabetes: 5.7 - 6.4          Diabetes: >6.4          Glycemic control for adults with diabetes: <7.0     CBG: No results for input(s): "GLUCAP" in the last 168 hours.  Review of Systems:   Endorses chest discomfort, tightness.  No orthopnea or PND.  Past Medical History:  She,  has a past medical history of Alcohol abuse, Allergy, Anxiety, Cataract (06/09/2012), COPD (chronic obstructive pulmonary disease) ([place]), Depression, Drug withdrawal seizure with complication ([place])  (8/75/6433), Dyspnea, GERD (gastroesophageal reflux disease), Headache, Hypertension, Long term (current) use of anticoagulants, Neuromuscular disorder ([place]), Rupture of appendix (06/09/2012), Seizure ([place]), Seizures ([place]), and Urinary incontinence (06/09/2012).   Surgical History:   Past Surgical History:  Procedure Laterality Date   APPENDECTOMY     BIOPSY  01/04/2021   Procedure: BIOPSY;  Surgeon: Yetta Flock, MD;  Location: [facility];  Service: Gastroenterology;;   BIOPSY  03/12/2021   Procedure: BIOPSY;  Surgeon:  Mansouraty, Telford Nab., MD;  Location: [facility];  Service: Gastroenterology;;   CATARACT EXTRACTION  06/09/2012   Left eye   COLONOSCOPY WITH PROPOFOL N/A 03/12/2021   Procedure: COLONOSCOPY WITH PROPOFOL;  Surgeon: Rush Landmark Telford Nab., MD;  Location: [facility];  Service: Gastroenterology;  Laterality: N/A;   ESOPHAGOGASTRODUODENOSCOPY (EGD) WITH PROPOFOL N/A 01/04/2021   Procedure: ESOPHAGOGASTRODUODENOSCOPY (EGD) WITH PROPOFOL;  Surgeon: Yetta Flock, MD;  Location: [facility];  Service: Gastroenterology;  Laterality: N/A;   left shoulder dislocation  Sept 2011   POLYPECTOMY  03/12/2021   Procedure: POLYPECTOMY;  Surgeon: Mansouraty, Telford Nab., MD;  Location: [facility];  Service: Gastroenterology;;     Social History:   reports that she has been smoking cigarettes. She has a 21.50 pack-year smoking history. She has never used smokeless tobacco. She reports current alcohol use of about 3.0 standard drinks of alcohol per week. She reports current drug use. Drugs: Marijuana and Other-see comments.   Family History:  Her family history includes Aneurysm in her mother; Depression in her father; Heart disease in her father; Hyperlipidemia in her mother; Hypertension in her mother and sister; Parkinsonism in her father. There is no history of Colon cancer, Esophageal cancer, Rectal cancer, or Stomach cancer.   Allergies Allergies  Allergen  Reactions   Capsaicin-Menthol Other (See Comments)    Burning and peeling on area applied to   Diclo Gel [Diclofenac Sodium] Other (See Comments)    GEL AND CREAM-burning and peeling at the sight of application.     Home Medications  Prior to Admission medications   Medication Sig Start Date End Date Taking? Authorizing Provider  acetaminophen (TYLENOL) 650 MG CR tablet Take 1,950 mg by mouth 2 (two) times daily as needed for pain.   Yes [provider]  albuterol (VENTOLIN HFA) 108 (90 Base) MCG/ACT inhaler Inhale 2 puffs into the lungs every 6 (six) hours as needed for wheezing or shortness of breath. 09/23/21  Yes Pray, Norwood Levo, MD  ANORO ELLIPTA 62.5-25 MCG/ACT AEPB INHALE 1 PUFF BY MOUTH EVERY DAY Patient taking differently: Inhale 1 puff into the lungs every morning. 06/09/21  Yes Martyn Malay, MD  CALCIUM-MAGNESIUM-ZINC PO Take 1 tablet by mouth daily.   Yes [provider]  Cholecalciferol (VITAMIN D3 PO) Take 1 capsule by mouth daily.   Yes [provider]  famotidine (PEPCID) 20 MG tablet Take 20 mg by mouth 2 (two) times daily as needed for heartburn or indigestion.   Yes [provider]  feeding supplement (ENSURE ENLIVE / ENSURE PLUS) LIQD Take 237 mLs by mouth 2 (two) times daily between meals. Patient taking differently: Take 237 mLs by mouth daily as needed (meal supplement). 01/06/21  Yes Alen Bleacher, MD  gabapentin (NEURONTIN) 600 MG tablet Take 1 tablet (600 mg total) by mouth 3 (three) times daily. 10/15/21  Yes Pray, Norwood Levo, MD  Multiple Vitamins-Minerals (CENTRUM SILVER 50+WOMEN PO) Take 1 tablet by mouth daily.   Yes [provider]  nortriptyline (PAMELOR) 25 MG capsule TAKE 2 CAPSULES (50 MG TOTAL) BY MOUTH EVERY DAY AT BEDTIME Patient taking differently: Take 75 mg by mouth at bedtime. 11/10/21  Yes Pray, Norwood Levo, MD  ondansetron (ZOFRAN-ODT) 8 MG disintegrating tablet Take 1 tablet (8 mg total) by mouth every 8  (eight) hours as needed. Patient taking differently: Take 8 mg by mouth every 8 (eight) hours as needed for nausea. 10/30/21  Yes Pray, Norwood Levo, MD  pantoprazole (PROTONIX) 40 MG tablet  Take 1 tablet (40 mg total) by mouth every morning. 10/22/21  Yes Pray, Norwood Levo, MD  promethazine (PHENERGAN) 12.5 MG suppository Place 12.5 mg rectally daily as needed for nausea/vomiting. 01/26/21  Yes [provider]  QUEtiapine (SEROQUEL) 100 MG tablet Take 1 tablet (100 mg total) by mouth at bedtime for 28 days. 10/22/21 11/22/22 Yes Pray, Norwood Levo, MD  busPIRone (BUSPAR) 10 MG tablet TAKE 1 TABLET BY MOUTH THREE TIMES A DAY Patient taking differently: Take 10 mg by mouth 3 (three) times daily. 11/16/21   [name], MD  metoCLOPramide (REGLAN) 10 MG tablet Take 1 tablet (10 mg total) by mouth every 6 (six) hours as needed for nausea. Patient not taking: Reported on 10/22/2021 10/14/21   Tegeler, Gwenyth Allegra, MD  OXYGEN Inhale into the lungs as needed.    [provider]     Critical care time:     CRITICAL CARE Performed by: Lanier Clam   Total critical care time: 45 minutes  Critical care time was exclusive of separately billable procedures and treating other patients.  Critical care was necessary to treat or prevent imminent or life-threatening deterioration.  Critical care was time spent personally by me on the following activities: development of treatment plan with patient and/or surrogate as well as nursing, discussions with consultants, evaluation of patient's response to treatment, examination of patient, obtaining history from patient or surrogate, ordering and performing treatments and interventions, ordering and review of laboratory studies, ordering and review of radiographic studies, pulse oximetry and re-evaluation of patient's condition.   Lanier Clam, MD See Shea Evans for contact info

## 2021-11-22 NOTE — Progress Notes (Addendum)
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 presents to the ER  with rib pain , reports has been in bed after a fall 3 days ago , reported was laying in urine and stool in bed, last alcohol use was tuesday Husband is the main caregiver, reportedly not able to take care of her anymore at home  Subjective:  Vomited several times last night, developed increase cough and hypoxia this am Wbc got worse this am Report chronic back pain, denies abdominal pain, last bowel movement yesterday  Assessment & Plan:  Principal Problem:   Right rib fracture Active Problems:   Elevated LFTs   Tobacco abuse   Alcohol use   Paroxysmal atrial fibrillation (HCC)   Chronic obstructive pulmonary disease, group B, by Global Initiative for Chronic Obstructive Lung Disease 2017 classification ([place])   Hypokalemia   Aspiration pneumonia ([place])    Assessment and Plan:    Aspiration pneumonia/acute hypoxic respiratory failure -presents on admission, got worse overnight due to increased n/v -initial CT chest obtained in the ED showed " Scattered small clusters of subpleural ground-glass densities throughout the lungs, new since the prior CT" -Procalcitonin 10.8, urine strep pneumo negative -keep npo, continue aspiration precaution ,get stat abg/cxr/ab x ray Sputum culture pending collection, blood culture no growth  -continue abx/nebs -transfer to stepdown  Addendum: o2 dropped to 80% , she was put on NRB, critical care consulted    N/V -CT ab /pel obtained in the ED showed :Fatty liver. No intrahepatic biliary ductal dilatation. The gallbladder is unremarkable. Prior ab [place] from 7/24 showed hepatic steatosis, no gallstones -will get kub,  -Prn antiemetics -Continue IV  PPI for now for empirical cover for possible alcohol-related esophagitis/gastritis -Consider GI consult if nausea vomiting persists -keep npo   kub personally reviewed  concerning for ileus , awaiting for official reports Insert NG  Right rib fracture  From fall at home EDP discussed case with trauma surgery who declined admission. Incentive spirometer Pain control, Prn oxycodone 2.5 mg for pain. Avoiding tylenol due to elevated LFTs.       Abnormal ua Urine appear very dark  urine culture with multiple species Continue abx, continue IV hydration       Elevated LFTs Hepatitis panel negative CK unremarkable Alcohol level less than 10 CT ab/pel obtained in the ED showed :Fatty liver. No intrahepatic biliary ductal dilatation. The gallbladder is unremarkable. Prior ab [place] from 7/24 showed hepatic steatosis, no gallstones Avoid hepatic toxic, hold seroquel ( Pt recently started Seroquel last month. PCP refusing to refill pt's clonazepam due to prior hx of benzo abuse.) Monitor LFT   Hypokalemia/hypomagnesemia/hyponatremia In the setting of nausea vomiting and alcohol use Replace Recheck in the morning    COPD on home O2 Lung exam significant rhonchus, intermittent wheezing Continue nebs, antibiotics  Paroxysmal atrial fibrillation (HCC) Likely due to alcohol use  Pt at high fall risk making systemic anticoagulation a contraindication. Not on any rate or rhythm agent at home Recent TSH unremarkable Keep K above 4, mag above 2   FTT: PCP notes that pt has refused SNF placement multiple times in the past.  Apparently husband told EMS he can no longer care for her at home.     Alcohol use Pt states  she drank 3-4 wine coolers 2-3 days ago. Same timeline when she fell out of bed. Low risk for alcohol withdrawal. Given recent discontinuation of clonazepam by her PCP will not start CIWA. Will continue her on her neurontin 600 mg tid which would also protect her for any  potential etoh withdrawal seizures.  Appears that PCP has worked hard over last several years to taper pt off benzos. Would not want to jeopardize PCP hard work by restarting benzos.   Tobacco abuse Chronic. Pt requesting nicotine patch. Will order 21 mg    . I have Reviewed nursing notes, Vitals, pain scores, I/o's, Lab results and  imaging results since pt's last encounter, details please see discussion above  I ordered the following labs:  Unresulted Labs (From admission, onward)     Start     Ordered   11/22/21 0846  Expectorated Sputum Assessment w Gram Stain, Rflx to Resp Cult  Once,   R        11/22/21 0845   11/22/21 0834  Magnesium  ONCE - STAT,   STAT       Question:  Specimen collection method  Answer:  Lab=Lab collect   11/22/21 0834   11/22/21 0833  Lipase, blood  Once,   R       Question:  Specimen collection method  Answer:  Lab=Lab collect   11/22/21 0832   11/22/21 0816  Blood gas, arterial  ONCE - STAT,   R        11/22/21 0815   11/21/21 1753  Expectorated Sputum Assessment w Gram Stain, Rflx to Resp Cult  Once,   R        11/21/21 1752             DVT prophylaxis: heparin injection 5,000 Units Start: 11/21/21 0600 SCDs Start: 11/21/21 0035   Code Status:   Code Status: Full Code  Family Communication: She agreed me to call her HPOA Mr Pryor Ochoa, call placed, no answer, not able to leave a message Disposition:    Dispo: The patient is from: Home              Anticipated d/c is to: TBD              Anticipated d/c date is: TBD, please see above discussion   Antimicrobials:    Anti-infectives (From admission, onward)    Start     Dose/Rate Route Frequency Ordered Stop   11/21/21 2200  azithromycin (ZITHROMAX) 500 mg in sodium chloride 0.9 % 250 mL IVPB        500 mg 250 mL/hr over 60 Minutes Intravenous Every 24 hours 11/21/21 1231 11/25/21 2159   11/21/21 2200  cefTRIAXone (ROCEPHIN) 1 g in sodium chloride 0.9 % 100 mL IVPB        1 g 200 mL/hr  over 30 Minutes Intravenous Every 24 hours 11/21/21 1231 11/25/21 2159   11/20/21 2330  azithromycin (ZITHROMAX) 500 mg in sodium chloride 0.9 % 250 mL IVPB        500 mg 250 mL/hr over 60 Minutes Intravenous  Once 11/20/21 2318 11/21/21 0752   11/20/21 2200  cefTRIAXone (ROCEPHIN) 1 g in sodium chloride 0.9 % 100 mL IVPB        1 g 200 mL/hr over 30 Minutes Intravenous  Once 11/20/21 2155 11/20/21 2253          Objective: Vitals:   11/22/21 0601 11/22/21 0735 11/22/21 0737 11/22/21 0827  BP:  Pulse:      Resp:      Temp:      TempSrc:      SpO2:  92% 94% (!) 88%  Weight: 71.2 kg     Height:        Intake/Output Summary (Last 24 hours) at 11/22/2021 0850 Last data filed at 11/22/2021 0756 Gross per 24 hour  Intake 3671.05 ml  Output 1 ml  Net 3670.05 ml   Filed Weights   11/20/21 2056 11/22/21 0601  Weight: 63.5 kg 71.2 kg    Examination:  General exam: Pale, alert, interactive, intermittent vomiting/coughing Respiratory system: Significant rhonchus,.  Tachypnea, no accessory muscle use, able to talk Cardiovascular system: Tachycardia Gastrointestinal system: Abdomen appears distended and tight , decreased bowel sounds  Central nervous system: Alert and oriented. No focal neurological deficits. Extremities:  no edema Skin: Scattered abrasions Psychiatry: Anxious.     Data Reviewed: I have personally reviewed  labs and visualized  imaging studies since the last encounter and formulate the plan        Scheduled Meds:  busPIRone  10 mg Oral TID   folic acid  1 mg Oral Daily   gabapentin  600 mg Oral TID   guaiFENesin  600 mg Oral BID   heparin  5,000 Units Subcutaneous Q8H   ipratropium-albuterol  3 mL Nebulization Q6H   multivitamin with minerals  1 tablet Oral Daily   nicotine  21 mg Transdermal Daily   nortriptyline  50 mg Oral QHS   pantoprazole (PROTONIX) IV  40 mg Intravenous Q12H   thiamine  100 mg Oral Daily   Or   thiamine  100 mg  Intravenous Daily   umeclidinium-vilanterol  1 puff Inhalation q morning   Continuous Infusions:  azithromycin (ZITHROMAX) 500 mg in sodium chloride 0.9 % 250 mL IVPB Stopped (11/22/21 0130)   cefTRIAXone (ROCEPHIN)  IV Stopped (11/21/21 2314)   potassium chloride     sodium chloride       LOS: 0 days      Florencia Reasons, MD PhD FACP [facility]  Available via Epic secure chat 7am-7pm for nonurgent issues Please page for urgent issues To page the attending provider between 7A-7P or the covering provider during after hours 7P-7A, please log into the web site [email or web address] and access using universal [place] password for that web site. If you do not have the password, please call the hospital operator.    11/22/2021, 8:50 AM

## 2021-11-22 NOTE — Progress Notes (Signed)
Patient developed spontaneous SVT.  EKG obtained, some P waves concerning for atrial flutter.  5 mg IV metoprolol with decrease in heart rate but again reveals intermittent P waves suggestive of atrial flutter to me.  Decompressing stomach given concern for ileus on abdominal imaging with concomitant significant nausea and vomiting.  Unable to use gut.  Given good response to IV metoprolol, this was scheduled 5 mg every 6.  Stopping scheduled short acting beta agonist via DuoNebs.  Can continue as needed.  Start long-acting beta agonist and long-acting muscarinic agonist via nebulized therapies tomorrow to replace Anoro that is currently on her medication list, home med.  Consider anticoagulation in the morning based on CHA2DS2-VASc 2 score if atrial arrhythmia persists.

## 2021-11-22 NOTE — Progress Notes (Signed)
Patient remains confused and cooperative. Ativan 1 mg IV administered twice this shift. Patient vomited five or more times, and vomits after po intake and pill administration.

## 2021-11-22 NOTE — Progress Notes (Signed)
Pt to Room 1229 with Rapid response and Respiratory Therapy helping with transfer. Pt stable though concerning in condition at time of transfer.

## 2021-11-22 NOTE — Significant Event (Signed)
Rapid Response Event Note   Reason for Call :  Bedside RN concerned about patient's color and oxygenation  Initial Focused Assessment:  Patient upright in bed wearing 4L [place]. Respirations sound wet from across the room, rhonchus bilaterally. O2 sat noted to be mid to low 80s,  increased to 6L. Respiratory at bedside, NRB applied for transport.  Patient vomited small amounts of yellow bilious fluid 8-10 times throughout  duration of rapid call.     Interventions:  CXR, KUB, oxygen  Plan of Care:  Transfer to stepdown   Event Summary:   MD Notified: Dr Erlinda Hong Call Time: [phone] Arrival Time: 0818 End Time: 0845  Cyndie Chime, RN

## 2021-11-22 NOTE — Progress Notes (Addendum)
   11/22/21 0800  Assess: MEWS Score  Temp 98 F (36.7 C)  BP 131/80  Pulse Rate (!) 119  Resp (!) 22  Level of Consciousness Alert  SpO2 (!) 84 %  O2 Device Room Air  Assess: MEWS Score  MEWS Temp 0  MEWS Systolic 0  MEWS Pulse 2  MEWS RR 1  MEWS LOC 0  MEWS Score 3  MEWS Score Color Yellow  Assess: if the MEWS score is Yellow or Red  Were vital signs taken at a resting state? Yes  Focused Assessment Change from prior assessment (see assessment flowsheet)  Does the patient meet 2 or more of the SIRS criteria? No  MEWS guidelines implemented *See Row Information* Yes  Treat  MEWS Interventions Consulted Respiratory Therapy;Escalated (See documentation below)  Pain Scale 0-10  Pain Score 5  Pain Type Acute pain  Pain Location Back  Pain Orientation Left;Right  Pain Descriptors / Indicators Aching  Pain Frequency Intermittent  Pain Onset With Activity  Patients Stated Pain Goal 2  Take Vital Signs  Increase Vital Sign Frequency  Yellow: Q 2hr X 2 then Q 4hr X 2, if remains yellow, continue Q 4hrs  Escalate  MEWS: Escalate Yellow: discuss with charge nurse/RN and consider discussing with provider and RRT  Notify: Charge Nurse/RN  Name of Charge Nurse/RN Notified English as a second language teacher  Date Charge Nurse/RN Notified 11/22/21  Time Charge Nurse/RN Notified 0800  Notify: Provider  Provider Name/Title Dr. Florencia Reasons  Date Provider Notified 11/22/21  Time Provider Notified [phone]  Method of Notification Page (then came to room)  Notification Reason Change in status  Provider response At bedside  Date of Provider Response 11/22/21  Time of Provider Response 0805 (placed orders in EMR)  Notify: Rapid Response  Name of Rapid Response RN Notified Zoe Rn  Date Rapid Response Notified 11/22/21  Time Rapid Response Notified 0810  Document  Patient Outcome Transferred/level of care increased  Assess: SIRS CRITERIA  SIRS Temperature  0  SIRS Pulse 1  SIRS Respirations  1  SIRS WBC 1   SIRS Score Sum  3    Rn to room to assess pt and noticed pt to be pale, cool to touch, and weak this am. Pt room air was 84 percent on room air sat was with a respiratory rate of 22-24. Pt lungs sounded wet, with crackles, and rhonchi. Pt also had several episodes of nausea with vomiting up yellow, mucus. Rapid response, Md, and Respiratory were called. All came to room to assess and care for pt. Pt was transferred to room 1229 at [facility].

## 2021-11-23 ENCOUNTER — Inpatient Hospital Stay (HOSPITAL_COMMUNITY): Payer: Medicare Other

## 2021-11-23 DIAGNOSIS — Z789 Other specified health status: Secondary | ICD-10-CM | POA: Diagnosis not present

## 2021-11-23 DIAGNOSIS — J9601 Acute respiratory failure with hypoxia: Secondary | ICD-10-CM

## 2021-11-23 DIAGNOSIS — Z720 Tobacco use: Secondary | ICD-10-CM

## 2021-11-23 DIAGNOSIS — J189 Pneumonia, unspecified organism: Secondary | ICD-10-CM

## 2021-11-23 DIAGNOSIS — J449 Chronic obstructive pulmonary disease, unspecified: Secondary | ICD-10-CM | POA: Diagnosis not present

## 2021-11-23 DIAGNOSIS — I4892 Unspecified atrial flutter: Secondary | ICD-10-CM | POA: Diagnosis not present

## 2021-11-23 DIAGNOSIS — J690 Pneumonitis due to inhalation of food and vomit: Secondary | ICD-10-CM | POA: Diagnosis not present

## 2021-11-23 DIAGNOSIS — M7989 Other specified soft tissue disorders: Secondary | ICD-10-CM

## 2021-11-23 DIAGNOSIS — I480 Paroxysmal atrial fibrillation: Secondary | ICD-10-CM

## 2021-11-23 DIAGNOSIS — S2231XA Fracture of one rib, right side, initial encounter for closed fracture: Secondary | ICD-10-CM | POA: Diagnosis not present

## 2021-11-23 LAB — CBC WITH DIFFERENTIAL/PLATELET
Abs Immature Granulocytes: 0.12 10*3/uL — ABNORMAL HIGH (ref 0.00–0.07)
Basophils Absolute: 0 10*3/uL (ref 0.0–0.1)
Basophils Relative: 0 %
Eosinophils Absolute: 0 10*3/uL (ref 0.0–0.5)
Eosinophils Relative: 0 %
HCT: 40.3 % (ref 36.0–46.0)
Hemoglobin: 13.2 g/dL (ref 12.0–15.0)
Immature Granulocytes: 1 %
Lymphocytes Relative: 10 %
Lymphs Abs: 2 10*3/uL (ref 0.7–4.0)
MCH: 33.8 pg (ref 26.0–34.0)
MCHC: 32.8 g/dL (ref 30.0–36.0)
MCV: 103.1 fL — ABNORMAL HIGH (ref 80.0–100.0)
Monocytes Absolute: 1.2 10*3/uL — ABNORMAL HIGH (ref 0.1–1.0)
Monocytes Relative: 6 %
Neutro Abs: 15.9 10*3/uL — ABNORMAL HIGH (ref 1.7–7.7)
Neutrophils Relative %: 83 %
Platelets: 189 10*3/uL (ref 150–400)
RBC: 3.91 MIL/uL (ref 3.87–5.11)
RDW: 13.1 % (ref 11.5–15.5)
WBC: 19.2 10*3/uL — ABNORMAL HIGH (ref 4.0–10.5)
nRBC: 0.1 % (ref 0.0–0.2)

## 2021-11-23 LAB — MAGNESIUM
Magnesium: 1.7 mg/dL (ref 1.7–2.4)
Magnesium: 2.2 mg/dL (ref 1.7–2.4)

## 2021-11-23 LAB — RESPIRATORY PANEL BY PCR

## 2021-11-23 LAB — COMPREHENSIVE METABOLIC PANEL
ALT: 54 U/L — ABNORMAL HIGH (ref 0–44)
AST: 41 U/L (ref 15–41)
Albumin: 1.7 g/dL — ABNORMAL LOW (ref 3.5–5.0)
Alkaline Phosphatase: 130 U/L — ABNORMAL HIGH (ref 38–126)
Anion gap: 9 (ref 5–15)
BUN: 11 mg/dL (ref 8–23)
CO2: 25 mmol/L (ref 22–32)
Calcium: 7.4 mg/dL — ABNORMAL LOW (ref 8.9–10.3)
Chloride: 101 mmol/L (ref 98–111)
Creatinine, Ser: 0.8 mg/dL (ref 0.44–1.00)
GFR, Estimated: >60 mL/min (ref >60)
Glucose, Bld: 95 mg/dL (ref 70–99)
Potassium: 4.9 mmol/L (ref 3.5–5.1)
Sodium: 135 mmol/L (ref 135–145)
Total Bilirubin: 0.6 mg/dL (ref 0.3–1.2)
Total Protein: 4.5 g/dL — ABNORMAL LOW (ref 6.5–8.1)

## 2021-11-23 LAB — BASIC METABOLIC PANEL
Anion gap: 9 (ref 5–15)
BUN: 14 mg/dL (ref 8–23)
CO2: 27 mmol/L (ref 22–32)
Calcium: 7.8 mg/dL — ABNORMAL LOW (ref 8.9–10.3)
Chloride: 100 mmol/L (ref 98–111)
Creatinine, Ser: 0.88 mg/dL (ref 0.44–1.00)
GFR, Estimated: >60 mL/min (ref >60)
Glucose, Bld: 98 mg/dL (ref 70–99)
Potassium: 4.2 mmol/L (ref 3.5–5.1)
Sodium: 136 mmol/L (ref 135–145)

## 2021-11-23 LAB — ECHOCARDIOGRAM COMPLETE
AR max vel: 2.42 cm2
AV Peak grad: 7.3 mmHg
Ao pk vel: 1.35 m/s
Area-P 1/2: 3.66 cm2
Calc EF: 62.3 %
Height: 63 in
S' Lateral: 2.8 cm
Single Plane A2C EF: 62.9 %
Single Plane A4C EF: 62.8 %
Weight: 2641.99 oz

## 2021-11-23 LAB — AMMONIA: Ammonia: 39 umol/L — ABNORMAL HIGH (ref 9–35)

## 2021-11-23 LAB — LACTIC ACID, PLASMA
Lactic Acid, Venous: 1.7 mmol/L (ref 0.5–1.9)
Lactic Acid, Venous: 1.4 mmol/L (ref 0.5–1.9)

## 2021-11-23 MED ORDER — THIAMINE HCL 100 MG PO TABS
100.0000 mg | ORAL_TABLET | Freq: Every day | ORAL | Status: DC
  Administered 2021-11-23 – 2021-11-27 (×5): 100 mg
  Filled 2021-11-23 (×5): qty 1

## 2021-11-23 MED ORDER — FUROSEMIDE 10 MG/ML IJ SOLN
40.0000 mg | Freq: Once | INTRAMUSCULAR | Status: AC
Start: 2021-11-23 — End: 2021-11-23
  Administered 2021-11-23: 40 mg via INTRAVENOUS
  Filled 2021-11-23: qty 4

## 2021-11-23 MED ORDER — THIAMINE HCL 100 MG/ML IJ SOLN: 100.0000 mg | Freq: Every day | INTRAMUSCULAR | Status: DC

## 2021-11-23 MED ORDER — MAGNESIUM SULFATE 2 GM/50ML IV SOLN
2.0000 g | Freq: Once | INTRAVENOUS | Status: AC
  Administered 2021-11-23: 2 g via INTRAVENOUS
  Filled 2021-11-23: qty 50

## 2021-11-23 MED ORDER — BUSPIRONE HCL 10 MG PO TABS
10.0000 mg | ORAL_TABLET | Freq: Three times a day (TID) | ORAL | Status: DC
  Administered 2021-11-23 – 2021-11-28 (×18): 10 mg
  Filled 2021-11-23 (×18): qty 1

## 2021-11-23 MED ORDER — DEXMEDETOMIDINE HCL IN NACL 200 MCG/50ML IV SOLN
0.4000 ug/kg/h | INTRAVENOUS | Status: DC
  Administered 2021-11-23: 0.4 ug/kg/h via INTRAVENOUS
  Administered 2021-11-24: 0.5 ug/kg/h via INTRAVENOUS
  Filled 2021-11-23 (×3): qty 50

## 2021-11-23 MED ORDER — LORAZEPAM 2 MG/ML IJ SOLN
1.0000 mg | INTRAMUSCULAR | Status: AC | PRN
  Administered 2021-11-23 – 2021-11-24 (×3): 2 mg via INTRAVENOUS
  Filled 2021-11-23 (×4): qty 1

## 2021-11-23 MED ORDER — ADULT MULTIVITAMIN LIQUID CH
15.0000 mL | Freq: Every day | ORAL | Status: DC
  Administered 2021-11-23 – 2021-11-28 (×6): 15 mL
  Filled 2021-11-23 (×6): qty 15

## 2021-11-23 MED ORDER — GUAIFENESIN 100 MG/5ML PO LIQD
15.0000 mL | Freq: Four times a day (QID) | ORAL | Status: DC
Start: 2021-11-23 — End: 2021-11-29
  Administered 2021-11-23 – 2021-11-29 (×23): 15 mL
  Filled 2021-11-23 (×21): qty 20

## 2021-11-23 MED ORDER — OXYCODONE HCL 5 MG PO TABS
2.5000 mg | ORAL_TABLET | ORAL | Status: DC | PRN
  Administered 2021-11-26: 2.5 mg
  Filled 2021-11-23: qty 1

## 2021-11-23 MED ORDER — ONDANSETRON HCL 4 MG/2ML IJ SOLN
4.0000 mg | Freq: Four times a day (QID) | INTRAMUSCULAR | Status: DC | PRN
Start: 2021-11-23 — End: 2021-11-29
  Administered 2021-11-26: 4 mg via INTRAVENOUS
  Filled 2021-11-23: qty 2

## 2021-11-23 MED ORDER — SODIUM CHLORIDE 0.9 % IV BOLUS
500.0000 mL | Freq: Once | INTRAVENOUS | Status: AC
Start: 2021-11-23 — End: 2021-11-23
  Administered 2021-11-23: 500 mL via INTRAVENOUS

## 2021-11-23 MED ORDER — NORTRIPTYLINE HCL 25 MG PO CAPS
50.0000 mg | ORAL_CAPSULE | Freq: Every day | ORAL | Status: DC
  Administered 2021-11-23 – 2021-11-28 (×6): 50 mg
  Filled 2021-11-23 (×6): qty 2

## 2021-11-23 MED ORDER — METHYLPREDNISOLONE SODIUM SUCC 40 MG IJ SOLR
20.0000 mg | Freq: Two times a day (BID) | INTRAMUSCULAR | Status: AC
Start: 2021-11-23 — End: 2021-11-26
  Administered 2021-11-23 – 2021-11-26 (×8): 20 mg via INTRAVENOUS
  Filled 2021-11-23 (×8): qty 1

## 2021-11-23 MED ORDER — FUROSEMIDE 10 MG/ML IJ SOLN
40.0000 mg | Freq: Once | INTRAMUSCULAR | Status: AC
  Administered 2021-11-23: 40 mg via INTRAVENOUS
  Filled 2021-11-23: qty 4

## 2021-11-23 MED ORDER — LORAZEPAM 1 MG PO TABS
1.0000 mg | ORAL_TABLET | ORAL | Status: AC | PRN
  Administered 2021-11-23: 2 mg
  Filled 2021-11-23: qty 2

## 2021-11-23 MED ORDER — GABAPENTIN 250 MG/5ML PO SOLN
600.0000 mg | Freq: Three times a day (TID) | ORAL | Status: DC
  Administered 2021-11-23 – 2021-11-28 (×18): 600 mg
  Filled 2021-11-23 (×20): qty 12

## 2021-11-23 MED ORDER — FOLIC ACID 1 MG PO TABS
1.0000 mg | ORAL_TABLET | Freq: Every day | ORAL | Status: DC
  Administered 2021-11-23 – 2021-11-28 (×6): 1 mg
  Filled 2021-11-23 (×6): qty 1

## 2021-11-23 MED ORDER — ONDANSETRON HCL 4 MG PO TABS
4.0000 mg | ORAL_TABLET | Freq: Four times a day (QID) | ORAL | Status: DC | PRN
Start: 2021-11-23 — End: 2021-11-29

## 2021-11-23 NOTE — Progress Notes (Signed)
PT has been only alert to self since start of night shift at 1900. She was yelling out, and has had to have mittens in place for increasing agitation.   CIWA at 19 upon assessment, and given '3mg'$  IV ativan per order.   Speech is jumbled and patient continues to be fidgety, although no longer yelling out and interfering with care.   PT has had no UOP, bladder scan showed 256m in the bladder. PT had large black tarry stool, will request fecal occult due to patient history. Will also notify provider about worsening mentation.

## 2021-11-23 NOTE — Progress Notes (Signed)
Patient upon starting shift is much more alert than she was yesterday, but she has began yelling out again. Patient able to tell me who she is and what month/year it is but is unable to tell me anything other than that even after reinforcement given.   Per CIWA scale patient is now scoring 22. Precedex was started during dayshift because of high ativan requirements but blood pressure was unable to tolerate the continuous drip.   Patient told me that she is having "very bad" anxiety, and headache.She also has tremors present when holding arms outward. PT is unable to fully lift left arm due to weakness. She is also hallucinating seeing a kitten out in the hallway. Yesterday she was hallucinating a man being in the room, but that quickly resolved. Ativan given for symptom relief, and will continue to assess if she is able to tolerate precedex being resumed.

## 2021-11-23 NOTE — Progress Notes (Signed)
PT Cancellation Note  Patient Details Name: Karen Casey MRN: [id number] DOB: [birth year]   Cancelled Treatment:    Reason Eval/Treat Not Completed: Medical issues which prohibited therapy..will check back another day.    [facility]  Office: [phone] Pager: [phone]

## 2021-11-23 NOTE — Progress Notes (Signed)
Bilateral lower extremity venous duplex has been completed. Preliminary results can be found in CV Proc through chart review.   11/23/21 10:23 AM Karen Casey RVT

## 2021-11-23 NOTE — TOC Initial Note (Signed)
Transition of Care [facility]) - Initial/Assessment Note   Patient Details  Name: Karen Casey MRN: [id number] Date of Birth: [birth year]  [facility] CM/SW Contact:    Sherie Don, LCSW Phone Number: 11/23/2021, 2:59 PM  Clinical Narrative: [facility] consulted for SNF and PT evaluation has recommended SNF. CSW attempted to reach patient's significant other, Cecilie Kicks, but the number in the chart was incorrect.  CSW spoke with patient's sister due to patient's fluctuating orientation. Per sister, SNF has been recommended for the patient before but she has declined it each time. Sister stated patient is "noncompliant" and will not answer the phone when services such as [facility] have been set up. Sister has attempted to assist with setting up medical appointments, but patient will not allow her to assist anymore. Sister reported patient is eating very little at home and frequently will not get out of bed. Patient's significant other lives with her, but is overwhelmed with her care due to ETOH use and patient's increased needs with ADLs.  Sister provided number for Mr. Hessie Knows: [phone]. CSW spoke with Mr. Hessie Knows and confirmed he and patient's sister have spoken with the patient regarding the need for short-term rehab and patient is agreeable at this time. Mr. Hessie Knows stated, "we got to do the rehab." Patient does not have Medicare listed as an insurance, so Mr. Hessie Knows is going to look into it as he thought patient was active with Medicare. Mr. Hessie Knows is also aware that due to history of ETOH use, it will affect bed offers. TOC to follow.  Expected Discharge Plan: Skilled Nursing Facility Barriers to Discharge: Continued Medical Work up  Patient Goals and CMS Choice Patient states their goals for this hospitalization and ongoing recovery are:: Unable to determine due to orientation  Expected Discharge Plan and Services Expected Discharge Plan: [place] In-house Referral:  Clinical Social Work Living arrangements for the past 2 months: Single Family Home           DME Arranged: N/A DME Agency: NA  Prior Living Arrangements/Services Living arrangements for the past 2 months: [place] Lives with:: Significant Other Patient language and need for interpreter reviewed:: Yes Need for Family Participation in Patient Care: Yes (Comment) Care giver support system in place?: Yes (comment) Criminal Activity/Legal Involvement Pertinent to Current Situation/Hospitalization: No - Comment as needed  Activities of Daily Living Home Assistive Devices/Equipment: None ADL Screening (condition at time of admission) Patient's cognitive ability adequate to safely complete daily activities?: No Is the patient deaf or have difficulty hearing?: No Does the patient have difficulty seeing, even when wearing glasses/contacts?: No Does the patient have difficulty concentrating, remembering, or making decisions?: No Patient able to express need for assistance with ADLs?: Yes Does the patient have difficulty dressing or bathing?: Yes Independently performs ADLs?: No Communication: Independent Dressing (OT): Dependent Is this a change from baseline?: Pre-admission baseline Grooming: Dependent Is this a change from baseline?: Pre-admission baseline Feeding: Dependent Is this a change from baseline?: Pre-admission baseline Bathing: Dependent Is this a change from baseline?: Pre-admission baseline Toileting: Dependent Is this a change from baseline?: Pre-admission baseline In/Out Bed: Dependent Is this a change from baseline?: Pre-admission baseline Walks in Home: Dependent Is this a change from baseline?: Pre-admission baseline Does the patient have difficulty walking or climbing stairs?: Yes Weakness of Legs: Both Weakness of Arms/Hands: Both  Emotional Assessment Attitude/Demeanor/Rapport: Unable to Assess Affect (typically observed): Unable to Assess Alcohol /  Substance Use: Alcohol Use  Admission diagnosis:  Right rib fracture [S22.31XA] Closed fracture of one rib of right side, initial encounter [S22.31XA] Community acquired pneumonia, unspecified laterality [J18.9] Sepsis, due to unspecified organism, unspecified whether acute organ dysfunction present ([place]) [A41.9] Aspiration pneumonia ([place]) [J69.0] Patient Active Problem List   Diagnosis Date Noted   Acute respiratory failure with hypoxia ([place])    Aspiration pneumonia ([place]) 11/22/2021   Right rib fracture 11/20/2021   Elevated LFTs 11/20/2021   Osteoporosis 09/23/2021   Failure to thrive in adult 09/23/2021   Protein-calorie malnutrition, severe 07/28/2021   Vitamin D deficiency 07/03/2021   Peripheral neuropathy    Seborrheic keratoses 05/29/2021   Hepatic steatosis 01/05/2021   Hypokalemia 01/02/2021   Chronic obstructive pulmonary disease, group B, by Global Initiative for Chronic Obstructive Lung Disease 2017 classification ([place]) 12/09/2020   Chronic pain of breast 11/12/2020   Compression fracture of body of thoracic vertebra ([place]) 11/06/2020   SIADH (syndrome of inappropriate ADH production) ([place]) 10/12/2020   Protein-calorie malnutrition ([place]) 09/17/2020   Paroxysmal atrial fibrillation (HCC)    Shortness of breath    Alcohol withdrawal ([place]) 09/10/2020   Pulmonary nodules/lesions, multiple 07/14/2020   HTN (hypertension) 06/02/2018   Left foot pain 08/23/2017   Body mass index (BMI) 31.0-31.9, adult 03/29/2013   Community acquired pneumonia 03/14/2013   Anxiety disorder 06/12/2012   Alcohol use 05/28/2012   Benzodiazepine withdrawal without complication ([place]) 41/32/4401   Tobacco abuse 02/17/2012   GERD (gastroesophageal reflux disease) 02/04/2012   Depression 02/04/2012   PCP:  [name], MD Pharmacy:   [id number] [place] [id number]:  [phone]: [phone] Readmission Risk Interventions    11/23/2021    2:58 PM  Readmission Risk Prevention Plan  Medication Review (RN Care Manager) Complete  HRI or Home Care Consult Complete  SW Recovery Care/Counseling Consult Complete  Palliative Care Screening Not Applicable  Skilled Nursing Facility Not Complete  SNF Comments Patient's orientation is fluctuating and patient was in mittens in last 24 hours.

## 2021-11-23 NOTE — Progress Notes (Addendum)
PROGRESS NOTE  Karen Casey  DOB: 01/24/1956  PCP: [name], MD XAJ:[id number]  DOA: 11/20/2021  LOS: 1 day  Hospital Day: 4  Brief narrative: Karen Casey is a 66 y.o. 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 6/10, EMS was called to her house and found to be lying on basis.  3 days prior, while under the influence of alcohol, she fell and was reportedly unable to get up. Her husband lives in the same house and reportedly gave up on taking care of her  CT scan chest showed scattered groundglass nodular opacities.   Placed on antibiotic for concern of pneumonia.   Admitted to hospitalist service   6/11, rapid response was called because of hypoxemia and increased work of breathing.  She required up to 15 L of oxygen and was transferred to the stepdown unit.   Her respiratory status gradually improved and she was taken off nonrebreather mask to low-flow oxygen.    CT angio of chest was obtained which did not show any pulmonary artery embolism but it showed extensive patchy groundglass infiltrates in both upper lobes suggestive of multifocal pneumonia.  Abdominal x-ray showed dilated small bowel loops suggestive of diffuse adynamic ileus.  Critical care following as well. 6/12, patient was started on Precedex drip for agitation.  Subjective: Patient was seen and examined this morning. Middle-aged Caucasian female.  Looks older for age Partially sedated. Has NG tube to suction Muffled voice. No family at bedside  Principal Problem:   Right rib fracture Active Problems:   Elevated LFTs   Tobacco abuse   Alcohol use   Paroxysmal atrial fibrillation (HCC)   Chronic obstructive pulmonary disease, group B, by Global Initiative for Chronic Obstructive Lung Disease 2017 classification ([place])   Hypokalemia   Community acquired  pneumonia   Aspiration pneumonia ([place])   Acute respiratory failure with hypoxia ([place])    Assessment and plan: Multifocal pneumonia Acute respiratory failure with hypoxia -Per history, patient was in bed for 3 days.  Probably aspirated.  May also have component of pulmonary edema. -6/20, CT chest with extensive bilateral infiltrates. -Currently on IV Rocephin and azithromycin. -Required up to 15 L oxygen yesterday.  Gradually improving, on 8 L this afternoon. -Received 1 dose of Lasix today but it seems her blood pressure is falling down. -Critical care following. -Continue bronchodilators,, IV steroids   Adynamic ileus  -NG tube suction to continue. -Monitor and replete electrolytes.  Potassium and magnesium level normal this morning.  Check phosphorus level tomorrow Recent Labs  Lab 11/21/21 0410 11/22/21 0321 11/22/21 0958 11/22/21 1649 11/23/21 0303 11/23/21 1501  K 3.0* 3.1*  --  4.1 4.9 4.2  MG 1.9  --  1.8  --  1.7 2.2   Hepatic steatosis -Secondary to alcohol use.  Improving LFTs Recent Labs  Lab 11/20/21 2141 11/21/21 0410 11/22/21 0321 11/23/21 0303  AST 393* 266* 100* 41  ALT 144* 110* 84* 54*  ALKPHOS 155* 131* 153* 130*  BILITOT 1.1 0.8 0.9 0.6  PROT 5.0* 4.5* 4.7* 4.5*  ALBUMIN 2.1* 1.9* 1.9* 1.7*   Chronic alcohol abuse -CIWA protocol  Paroxysmal Atrial Fibrillation -Likely due to heavy alcoholism.  Not on anticoagulation because of high risk of fall.  Not on any AV nodal blocking agent at home.     Vertebral compression fractures -Imaging noted multiple compression fractures are seen in the  thoracic vertebrae particularly severe in the T6 vertebra with retropulsion of the posterior margin and spinal stenosis. This finding appears stable.  Goals of care   Code Status: Full Code    Mobility: Will benefit from PT eval when able to participate  Skin assessment:     Nutritional status:  Body mass index is 29.25 kg/m.          Diet:   Diet Order             Diet NPO time specified  Diet effective now                   DVT prophylaxis:  heparin injection 5,000 Units Start: 11/21/21 0600 SCDs Start: 11/21/21 0035   Antimicrobials: Rocephin azithromycin Fluid: Per critical care Consultants: Critical care Family Communication: None at bedside  Status is: Inpatient  Continue in-hospital care because: Hemodynamically unstable Level of care: Stepdown   Dispo: The patient is from: Home              Anticipated d/c is to: Pending clinical course              Patient currently is not medically stable to d/c.   Difficult to place patient No     Infusions:   azithromycin (ZITHROMAX) 500 mg in sodium chloride 0.9 % 250 mL IVPB Stopped (11/22/21 2234)   cefTRIAXone (ROCEPHIN)  IV Stopped (11/22/21 2310)   dexmedetomidine (PRECEDEX) IV infusion Stopped (11/23/21 1423)    Scheduled Meds:  arformoterol  15 mcg Nebulization BID   busPIRone  10 mg Per Tube TID   Chlorhexidine Gluconate Cloth  6 each Topical Daily   folic acid  1 mg Per Tube Daily   gabapentin  600 mg Per Tube TID   guaiFENesin  15 mL Per Tube Q6H   heparin  5,000 Units Subcutaneous Q8H   methylPREDNISolone (SOLU-MEDROL) injection  20 mg Intravenous BID   metoprolol tartrate  5 mg Intravenous Q6H   multivitamin  15 mL Per Tube Daily   nicotine  21 mg Transdermal Daily   nortriptyline  50 mg Per Tube QHS   pantoprazole (PROTONIX) IV  40 mg Intravenous Q12H   revefenacin  175 mcg Nebulization Daily   thiamine  100 mg Per Tube Daily   Or   thiamine  100 mg Intravenous Daily    PRN meds: albuterol, ipratropium-albuterol, LORazepam **OR** LORazepam, ondansetron **OR** ondansetron (ZOFRAN) IV, oxyCODONE   Antimicrobials: Anti-infectives (From admission, onward)    Start     Dose/Rate Route Frequency Ordered Stop   11/21/21 2200  azithromycin (ZITHROMAX) 500 mg in sodium chloride 0.9 % 250 mL IVPB        500 mg 250 mL/hr over 60 Minutes  Intravenous Every 24 hours 11/21/21 1231 11/25/21 2159   11/21/21 2200  cefTRIAXone (ROCEPHIN) 1 g in sodium chloride 0.9 % 100 mL IVPB        1 g 200 mL/hr over 30 Minutes Intravenous Every 24 hours 11/21/21 1231 11/25/21 2159   11/20/21 2330  azithromycin (ZITHROMAX) 500 mg in sodium chloride 0.9 % 250 mL IVPB        500 mg 250 mL/hr over 60 Minutes Intravenous  Once 11/20/21 2318 11/21/21 0752   11/20/21 2200  cefTRIAXone (ROCEPHIN) 1 g in sodium chloride 0.9 % 100 mL IVPB        1 g 200 mL/hr over 30 Minutes Intravenous  Once 11/20/21 2155 11/20/21 2253  Objective: Vitals:   11/23/21 1400 11/23/21 1500  BP: (!) 77/51 93/69  Pulse: 93 95  Resp: 20 (!) 24  Temp:    SpO2: 91% 90%    Intake/Output Summary (Last 24 hours) at 11/23/2021 1644 Last data filed at 11/23/2021 1609 Gross per 24 hour  Intake 1899.9 ml  Output 800 ml  Net 1099.9 ml   Filed Weights   11/20/21 2056 11/22/21 0601 11/23/21 0500  Weight: 63.5 kg 71.2 kg 74.9 kg   Weight change: 3.7 kg Body mass index is 29.25 kg/m.   Physical Exam: General exam: Elderly Caucasian female.  NG suction in place Skin: No rashes, lesions or ulcers. HEENT: Atraumatic, normocephalic, no obvious bleeding Lungs: Diminished respiratory effort CVS: Tachycardic, no murmur GI/Abd soft, mild distention, bowel sound present CNS: Drowsy, muffled voice Psychiatry: Sad affect Extremities: No edema, no calf tenderness  Data Review: I have personally reviewed the laboratory data and studies available.  F/u labs ordered Unresulted Labs (From admission, onward)     Start     Ordered   11/24/21 0500  CBC with Differential/Platelet  Daily,   R     Question:  Specimen collection method  Answer:  Lab=Lab collect   11/23/21 1639   11/24/21 6468  Basic metabolic panel  Daily,   R     Question:  Specimen collection method  Answer:  Lab=Lab collect   11/23/21 1639   11/24/21 0500  Phosphorus  Tomorrow morning,   R        Question:  Specimen collection method  Answer:  Lab=Lab collect   11/23/21 1639   11/23/21 1028  Legionella Pneumophila Serogp 1 Ur Ag  Once,   R        11/23/21 1027   11/23/21 0916  Respiratory (~20 pathogens) panel by PCR  (Respiratory panel by PCR (~20 pathogens, ~24 hr TAT)  w precautions)  Once,   R        11/23/21 0915   11/23/21 0915  SARS Coronavirus 2 by RT PCR (hospital order, performed in [facility] hospital lab) *cepheid single result test* Anterior Nasal Swab  (Tier 2 - Symptomatic/Asymptomatic)  Once,   R        11/23/21 0915   11/22/21 0902  MRSA Next Gen by PCR, Nasal  Once,   R        11/22/21 0901   11/22/21 0846  Expectorated Sputum Assessment w Gram Stain, Rflx to Resp Cult  Once,   R        11/22/21 0845   11/21/21 1753  Expectorated Sputum Assessment w Gram Stain, Rflx to Resp Cult  Once,   R        11/21/21 1752   Unscheduled  Occult blood card to lab, stool RN will collect  As needed,   R     Question:  Specimen to be collected by:  Answer:  RN will collect   11/23/21 0238            Signed, Terrilee Croak, MD [facility] 11/23/2021

## 2021-11-23 NOTE — Progress Notes (Addendum)
NAME:  Karen Casey, MRN:  [id number], DOB:  Apr 12, 1956, LOS: 1 ADMISSION DATE:  11/20/2021, CONSULTATION DATE:  11/23/21 REFERRING MD:  Dr. Erlinda Hong - [facility], CHIEF COMPLAINT:  SOB   History of Present Illness:  66 y.o. woman admitted to the hospital with rib pain.  Fell 3 days prior.  EMS was called to house and patient was laying in feces.  Reportedly lying in bed for 3 days.  Was drinking and felt 3 days prior to not move since.  [place], creatinine relatively stable.  Chest x-ray demonstrated scattered nodular opacities.  CT scan was obtained showed scattered groundglass nodular opacities.  Placed on antibiotic for concern of pneumonia.  Patient has been nauseated with vomiting.  Reportedly preceded admission.  Certainly present last couple days.  Rapid response called 11/22/2021 AM in setting of hypoxemia, increased work of breathing.  Placed on nonrebreather at [place].  Brought to the stepdown unit.  On evaluation, mildly tachypneic.  Tachypnea gradually improved.  Nonrebreather was able to be taken off with O2 sats in the low to mid 90s on 15 L [place].  Chest x-ray obtained showed similar findings to prior on my review interpretation.  Abdominal film concerning for dilated bowel loops, possible ileus?  Pertinent  Medical History  Alcohol abuse, severe to moderate COPD on PFTs earlier in 2023  Significant Hospital Events: Including procedures, antibiotic start and stop dates in addition to other pertinent events   11/20/21 admitted to hospital after a fall at home oxygen alcohol, found in feces, reportedly on 3 days, treated for pneumonia 6/12 Transferred to ICU, on [place], worsening encephalopathy but stable on [place]  Interim History / Subjective:  Agitated overnight, required mittens and CIWA 400cc UOP after Lasix, remains +3L BC with no growth thus far UC multiple species O2 requirement down-trending  Tachycardic in afib 110's Large dark stool overnight, no Hgb or BP drop  Objective   Blood  pressure 120/64, pulse (!) 114, temperature 98.4 F (36.9 C), temperature source Oral, resp. rate (!) 26, height '5\' 3"'$  (1.6 m), weight 74.9 kg, SpO2 94 %.    FiO2 (%):  [32 %-100 %] 100 %   Intake/Output Summary (Last 24 hours) at 11/23/2021 0721 Last data filed at 11/23/2021 0124 Gross per 24 hour  Intake 3378.9 ml  Output 400 ml  Net 2978.9 ml    Filed Weights   11/20/21 2056 11/22/21 0601 11/23/21 0500  Weight: 63.5 kg 71.2 kg 74.9 kg     General:  pale, ill-appearing F, uncomfortable-appearing  HEENT: MM pink/dry, sclera anicteric, pupils equal Neuro: awake, doesn't answer questions, calm, moves LE slightly to command CV: s1s2 tachycardic, irregular, no m/r/g PULM:  rhonchi and wheezing throughout, mildly tachypneic on 3L [place], no accessory muscle use GI: soft, high-pitched BS, mildly distended Extremities: warm/dry, 1+ edema  Skin: no rashes or lesions    Labs reviewed Mag 1.7 Ammonia 39 WBC 19  Resolved Hospital Problem list     Assessment & Plan:    Acute Hypoxic Respiratory Failure Baseline COPD and tobacco abuse  In the setting of ongoing nausea and vomiting with acute decompensation.  High suspicion for aspiration pneumonitis.  Chest x-ray largely unchanged.  Consider element of pulmonary edema.  CT chest 6/12 with extensive patchy ground-glass infiltrates are seen in the both upper lobes with significant interval worsening. Findings suggest multifocal pneumonia, possibly atypical viral pneumonia.  No PE -O2 requirement down-trending but still appears uncomfortable -Received Lasix '40mg'$  yesterday, minimal response  so give additional dose today, stop IVF, her renal function has been stable -Continue antibiotics for community-acquired pneumonia coverage -strep pneumo neg -follow respiratory cultures to completion -check RVP, doesn't appear had Covid test so will send -continue [place] -consider steroids for CAP/COPD exacerbation     Paroxysmal  Atrial Fibrillation Appears pt had an episode one year ago during hospitalization for withdrawal and was started on Eliquis.  She followed up with Cardiology and decision made to stop anti-coagulation as back in sinus, Chads-vasc score of 1 and hx of falls.  Appears did not follow up for stress test and holter monitor.  Last EF 60-65% -repeat EKG  -continue scheduled metoprolol -Lasix -repeat Echo -Mag 1.7, 2g ordered   Concern for Ileus -continue NG tube for now, moved bowels overnight  History of ETOH abuse Acute Encephalopathy CT head unremarkable -worsening overnight, had to be started on CIWA, suspect multifactorial in the setting of withdrawal and acute infection -Ammonia 39 -cont thiamine, folic acid   Best Practice (right click and "Reselect all SmartList Selections" daily)   Per primary  Labs   CBC: Recent Labs  Lab 11/20/21 2141 11/21/21 0410 11/22/21 0321 11/23/21 0303  WBC 16.5* 12.0* 17.2* 19.2*  NEUTROABS 13.6* 9.5*  --  15.9*  HGB 14.7 12.7 14.1 13.2  HCT 41.8 36.6 40.3 40.3  MCV 96.8 98.9 96.6 103.1*  PLT 233 170 209 189     Basic Metabolic Panel: Recent Labs  Lab 11/20/21 2141 11/21/21 0410 11/22/21 0321 11/22/21 0958 11/22/21 1649 11/23/21 0303  NA 134* 136 133*  --  133* 135  K 3.0* 3.0* 3.1*  --  4.1 4.9  CL 89* 98 97*  --  98 101  CO2 '28 27 28  '$ --  25 25  GLUCOSE 132* 110* 124*  --  110* 95  BUN '11 9 8  '$ --  9 11  CREATININE 1.00 0.83 0.68  --  0.75 0.80  CALCIUM 7.7* 7.0* 7.5*  --  7.5* 7.4*  MG  --  1.9  --  1.8  --  1.7    GFR: Estimated Creatinine Clearance: 69.8 mL/min (by C-G formula based on SCr of 0.8 mg/dL). Recent Labs  Lab 11/20/21 2141 11/20/21 2152 11/20/21 2318 11/21/21 0410 11/22/21 0321 11/22/21 1003 11/22/21 1649 11/23/21 0303  PROCALCITON  --   --   --  10.80  --   --   --   --   WBC 16.5*  --   --  12.0* 17.2*  --   --  19.2*  LATICACIDVEN  --  6.4* 6.4*  --   --  2.8* 2.8*  --      Liver Function  Tests: Recent Labs  Lab 11/20/21 2141 11/21/21 0410 11/22/21 0321 11/23/21 0303  AST 393* 266* 100* 41  ALT 144* 110* 84* 54*  ALKPHOS 155* 131* 153* 130*  BILITOT 1.1 0.8 0.9 0.6  PROT 5.0* 4.5* 4.7* 4.5*  ALBUMIN 2.1* 1.9* 1.9* 1.7*    Recent Labs  Lab 11/22/21 0958  LIPASE 20    Recent Labs  Lab 11/23/21 0303  AMMONIA 39*    ABG    Component Value Date/Time   PHART 7.41 11/22/2021 0824   PCO2ART 44 11/22/2021 0824   PO2ART 56 (L) 11/22/2021 0824   HCO3 27.9 11/22/2021 0824   TCO2 28 09/12/2020 1855   O2SAT 87.2 11/22/2021 0824     Coagulation Profile: Recent Labs  Lab 11/20/21 2141  INR 1.3*  Cardiac Enzymes: Recent Labs  Lab 11/20/21 2141  CKTOTAL 47     HbA1C: Hgb A1c MFr Bld  Date/Time Value Ref Range Status  09/17/2020 02:08 AM 5.2 4.8 - 5.6 % Final    Comment:    (NOTE) Pre diabetes:          5.7%-6.4%  Diabetes:              >6.4%  Glycemic control for   <7.0% adults with diabetes   06/20/2020 02:31 PM 5.3 4.8 - 5.6 % Final    Comment:             Prediabetes: 5.7 - 6.4          Diabetes: >6.4          Glycemic control for adults with diabetes: <7.0     CBG: No results for input(s): "GLUCAP" in the last 168 hours.  Review of Systems:   Endorses chest discomfort, tightness.  No orthopnea or PND.  Past Medical History:  She,  has a past medical history of Alcohol abuse, Allergy, Anxiety, Cataract (06/09/2012), COPD (chronic obstructive pulmonary disease) ([place]), Depression, Drug withdrawal seizure with complication ([place]) (2/42/6834), Dyspnea, GERD (gastroesophageal reflux disease), Headache, Hypertension, Long term (current) use of anticoagulants, Neuromuscular disorder ([place]), Rupture of appendix (06/09/2012), Seizure ([place]), Seizures ([place]), and Urinary incontinence (06/09/2012).   Surgical History:   Past Surgical History:  Procedure Laterality Date   APPENDECTOMY     BIOPSY  01/04/2021   Procedure: BIOPSY;  Surgeon:  Yetta Flock, MD;  Location: [facility];  Service: Gastroenterology;;   BIOPSY  03/12/2021   Procedure: BIOPSY;  Surgeon: Irving Copas., MD;  Location: [facility];  Service: Gastroenterology;;   CATARACT EXTRACTION  06/09/2012   Left eye   COLONOSCOPY WITH PROPOFOL N/A 03/12/2021   Procedure: COLONOSCOPY WITH PROPOFOL;  Surgeon: Irving Copas., MD;  Location: [facility];  Service: Gastroenterology;  Laterality: N/A;   ESOPHAGOGASTRODUODENOSCOPY (EGD) WITH PROPOFOL N/A 01/04/2021   Procedure: ESOPHAGOGASTRODUODENOSCOPY (EGD) WITH PROPOFOL;  Surgeon: Yetta Flock, MD;  Location: [facility];  Service: Gastroenterology;  Laterality: N/A;   left shoulder dislocation  Sept 2011   POLYPECTOMY  03/12/2021   Procedure: POLYPECTOMY;  Surgeon: Mansouraty, Telford Nab., MD;  Location: [facility];  Service: Gastroenterology;;     Social History:   reports that she has been smoking cigarettes. She has a 21.50 pack-year smoking history. She has never used smokeless tobacco. She reports current alcohol use of about 3.0 standard drinks of alcohol per week. She reports current drug use. Drugs: Marijuana and Other-see comments.   Family History:  Her family history includes Aneurysm in her mother; Depression in her father; Heart disease in her father; Hyperlipidemia in her mother; Hypertension in her mother and sister; Parkinsonism in her father. There is no history of Colon cancer, Esophageal cancer, Rectal cancer, or Stomach cancer.   Allergies Allergies  Allergen Reactions   Capsaicin-Menthol Other (See Comments)    Burning and peeling on area applied to   Diclo Gel [Diclofenac Sodium] Other (See Comments)    GEL AND CREAM-burning and peeling at the sight of application.     Home Medications  Prior to Admission medications   Medication Sig Start Date End Date Taking? Authorizing Provider  acetaminophen (TYLENOL) 650 MG CR tablet Take 1,950 mg by mouth 2 (two)  times daily as needed for pain.   Yes [provider]  albuterol (VENTOLIN HFA) 108 (90 Base) MCG/ACT inhaler Inhale  2 puffs into the lungs every 6 (six) hours as needed for wheezing or shortness of breath. 09/23/21  Yes Pray, Norwood Levo, MD  ANORO ELLIPTA 62.5-25 MCG/ACT AEPB INHALE 1 PUFF BY MOUTH EVERY DAY Patient taking differently: Inhale 1 puff into the lungs every morning. 06/09/21  Yes Martyn Malay, MD  CALCIUM-MAGNESIUM-ZINC PO Take 1 tablet by mouth daily.   Yes [provider]  Cholecalciferol (VITAMIN D3 PO) Take 1 capsule by mouth daily.   Yes [provider]  famotidine (PEPCID) 20 MG tablet Take 20 mg by mouth 2 (two) times daily as needed for heartburn or indigestion.   Yes [provider]  feeding supplement (ENSURE ENLIVE / ENSURE PLUS) LIQD Take 237 mLs by mouth 2 (two) times daily between meals. Patient taking differently: Take 237 mLs by mouth daily as needed (meal supplement). 01/06/21  Yes Alen Bleacher, MD  gabapentin (NEURONTIN) 600 MG tablet Take 1 tablet (600 mg total) by mouth 3 (three) times daily. 10/15/21  Yes Pray, Norwood Levo, MD  Multiple Vitamins-Minerals (CENTRUM SILVER 50+WOMEN PO) Take 1 tablet by mouth daily.   Yes [provider]  nortriptyline (PAMELOR) 25 MG capsule TAKE 2 CAPSULES (50 MG TOTAL) BY MOUTH EVERY DAY AT BEDTIME Patient taking differently: Take 75 mg by mouth at bedtime. 11/10/21  Yes Pray, Norwood Levo, MD  ondansetron (ZOFRAN-ODT) 8 MG disintegrating tablet Take 1 tablet (8 mg total) by mouth every 8 (eight) hours as needed. Patient taking differently: Take 8 mg by mouth every 8 (eight) hours as needed for nausea. 10/30/21  Yes Pray, Norwood Levo, MD  pantoprazole (PROTONIX) 40 MG tablet Take 1 tablet (40 mg total) by mouth every morning. 10/22/21  Yes Pray, Norwood Levo, MD  promethazine (PHENERGAN) 12.5 MG suppository Place 12.5 mg rectally daily as needed for nausea/vomiting. 01/26/21  Yes [provider]  QUEtiapine (SEROQUEL) 100 MG tablet Take 1 tablet (100 mg total) by mouth at bedtime for 28 days. 10/22/21 11/22/22 Yes Pray, Norwood Levo, MD  busPIRone (BUSPAR) 10 MG tablet TAKE 1 TABLET BY MOUTH THREE TIMES A DAY Patient taking differently: Take 10 mg by mouth 3 (three) times daily. 11/16/21   [name], MD  metoCLOPramide (REGLAN) 10 MG tablet Take 1 tablet (10 mg total) by mouth every 6 (six) hours as needed for nausea. Patient not taking: Reported on 10/22/2021 10/14/21   Tegeler, Gwenyth Allegra, MD  OXYGEN Inhale into the lungs as needed.    [provider]     Critical care time:  35 minutes    CRITICAL CARE Performed by: Otilio Carpen Kadeisha Betsch   Total critical care time: 35 minutes  Critical care time was exclusive of separately billable procedures and treating other patients.  Critical care was necessary to treat or prevent imminent or life-threatening deterioration.  Critical care was time spent personally by me on the following activities: development of treatment plan with patient and/or surrogate as well as nursing, discussions with consultants, evaluation of patient's response to treatment, examination of patient, obtaining history from patient or surrogate, ordering and performing treatments and interventions, ordering and review of laboratory studies, ordering and review of radiographic studies, pulse oximetry and re-evaluation of patient's condition.   Otilio Carpen Claudy Abdallah, PA-C [facility] See Amion for pager If no response to pager , please call [phone] until 7pm After 7:00 pm call Elink  [phone]

## 2021-11-24 ENCOUNTER — Inpatient Hospital Stay (HOSPITAL_COMMUNITY): Payer: Medicare Other

## 2021-11-24 DIAGNOSIS — J690 Pneumonitis due to inhalation of food and vomit: Secondary | ICD-10-CM | POA: Diagnosis not present

## 2021-11-24 DIAGNOSIS — S2231XA Fracture of one rib, right side, initial encounter for closed fracture: Secondary | ICD-10-CM | POA: Diagnosis not present

## 2021-11-24 DIAGNOSIS — G9340 Encephalopathy, unspecified: Secondary | ICD-10-CM

## 2021-11-24 DIAGNOSIS — Z789 Other specified health status: Secondary | ICD-10-CM | POA: Diagnosis not present

## 2021-11-24 DIAGNOSIS — J9601 Acute respiratory failure with hypoxia: Secondary | ICD-10-CM | POA: Diagnosis not present

## 2021-11-24 LAB — CBC WITH DIFFERENTIAL/PLATELET
Abs Immature Granulocytes: 0.13 10*3/uL — ABNORMAL HIGH (ref 0.00–0.07)
Basophils Absolute: 0 10*3/uL (ref 0.0–0.1)
Basophils Relative: 0 %
Eosinophils Absolute: 0 10*3/uL (ref 0.0–0.5)
Eosinophils Relative: 0 %
HCT: 39.3 % (ref 36.0–46.0)
Hemoglobin: 12.9 g/dL (ref 12.0–15.0)
Immature Granulocytes: 1 %
Lymphocytes Relative: 4 %
Lymphs Abs: 0.8 10*3/uL (ref 0.7–4.0)
MCH: 34.5 pg — ABNORMAL HIGH (ref 26.0–34.0)
MCHC: 32.8 g/dL (ref 30.0–36.0)
MCV: 105.1 fL — ABNORMAL HIGH (ref 80.0–100.0)
Monocytes Absolute: 0.6 10*3/uL (ref 0.1–1.0)
Monocytes Relative: 3 %
Neutro Abs: 16.8 10*3/uL — ABNORMAL HIGH (ref 1.7–7.7)
Neutrophils Relative %: 92 %
Platelets: 157 10*3/uL (ref 150–400)
RBC: 3.74 MIL/uL — ABNORMAL LOW (ref 3.87–5.11)
RDW: 13.3 % (ref 11.5–15.5)
WBC: 18.3 10*3/uL — ABNORMAL HIGH (ref 4.0–10.5)
nRBC: 0 % (ref 0.0–0.2)

## 2021-11-24 LAB — BRAIN NATRIURETIC PEPTIDE: B Natriuretic Peptide: 353.7 pg/mL — ABNORMAL HIGH (ref 0.0–100.0)

## 2021-11-24 LAB — GLUCOSE, CAPILLARY
Glucose-Capillary: 108 mg/dL — ABNORMAL HIGH (ref 70–99)
Glucose-Capillary: 99 mg/dL (ref 70–99)
Glucose-Capillary: 105 mg/dL — ABNORMAL HIGH (ref 70–99)
Glucose-Capillary: 113 mg/dL — ABNORMAL HIGH (ref 70–99)
Glucose-Capillary: 85 mg/dL (ref 70–99)

## 2021-11-24 LAB — BASIC METABOLIC PANEL
Anion gap: 7 (ref 5–15)
BUN: 16 mg/dL (ref 8–23)
CO2: 26 mmol/L (ref 22–32)
Calcium: 7.7 mg/dL — ABNORMAL LOW (ref 8.9–10.3)
Chloride: 103 mmol/L (ref 98–111)
Creatinine, Ser: 0.8 mg/dL (ref 0.44–1.00)
GFR, Estimated: >60 mL/min (ref >60)
Glucose, Bld: 107 mg/dL — ABNORMAL HIGH (ref 70–99)
Potassium: 4.4 mmol/L (ref 3.5–5.1)
Sodium: 136 mmol/L (ref 135–145)

## 2021-11-24 LAB — PHOSPHORUS: Phosphorus: 4.3 mg/dL (ref 2.5–4.6)

## 2021-11-24 LAB — MAGNESIUM: Magnesium: 2.3 mg/dL (ref 1.7–2.4)

## 2021-11-24 LAB — MRSA NEXT GEN BY PCR, NASAL: MRSA by PCR Next Gen: NOT DETECTED

## 2021-11-24 MED ORDER — POTASSIUM CHLORIDE CRYS ER 20 MEQ PO TBCR: 30.0000 meq | EXTENDED_RELEASE_TABLET | ORAL | Status: DC

## 2021-11-24 MED ORDER — PIPERACILLIN-TAZOBACTAM 3.375 G IVPB
3.3750 g | Freq: Three times a day (TID) | INTRAVENOUS | Status: AC
  Administered 2021-11-24 – 2021-11-26 (×7): 3.375 g via INTRAVENOUS
  Filled 2021-11-24 (×7): qty 50

## 2021-11-24 MED ORDER — QUETIAPINE FUMARATE 100 MG PO TABS
100.0000 mg | ORAL_TABLET | Freq: Every day | ORAL | Status: DC
  Administered 2021-11-24: 100 mg
  Filled 2021-11-24: qty 1

## 2021-11-24 MED ORDER — FUROSEMIDE 10 MG/ML IJ SOLN
40.0000 mg | Freq: Three times a day (TID) | INTRAMUSCULAR | Status: DC
  Administered 2021-11-24 – 2021-11-26 (×6): 40 mg via INTRAVENOUS
  Filled 2021-11-24 (×6): qty 4

## 2021-11-24 MED ORDER — LORAZEPAM 1 MG PO TABS: 1.0000 mg | ORAL_TABLET | ORAL | Status: AC | PRN

## 2021-11-24 MED ORDER — SODIUM CHLORIDE 0.9 % IV SOLN
250.0000 mL | INTRAVENOUS | Status: DC
  Administered 2021-11-24: 250 mL via INTRAVENOUS

## 2021-11-24 MED ORDER — FUROSEMIDE 10 MG/ML IJ SOLN
40.0000 mg | Freq: Once | INTRAMUSCULAR | Status: AC
Start: 2021-11-24 — End: 2021-11-24
  Administered 2021-11-24: 40 mg via INTRAVENOUS
  Filled 2021-11-24: qty 4

## 2021-11-24 MED ORDER — PHENYLEPHRINE HCL-NACL 20-0.9 MG/250ML-% IV SOLN
25.0000 ug/min | INTRAVENOUS | Status: DC
  Administered 2021-11-24: 25 ug/min via INTRAVENOUS
  Administered 2021-11-25: 145 ug/min via INTRAVENOUS
  Administered 2021-11-25: 85 ug/min via INTRAVENOUS
  Filled 2021-11-24 (×3): qty 250

## 2021-11-24 MED ORDER — POTASSIUM CHLORIDE 20 MEQ PO PACK
40.0000 meq | PACK | Freq: Two times a day (BID) | ORAL | Status: DC
  Administered 2021-11-24 (×2): 40 meq
  Filled 2021-11-24 (×2): qty 2

## 2021-11-24 MED ORDER — SODIUM CHLORIDE 0.9 % IV BOLUS
1000.0000 mL | Freq: Once | INTRAVENOUS | Status: AC
  Administered 2021-11-24: 1000 mL via INTRAVENOUS

## 2021-11-24 MED ORDER — LORAZEPAM 2 MG/ML IJ SOLN
1.0000 mg | INTRAMUSCULAR | Status: AC | PRN
  Administered 2021-11-24: 3 mg via INTRAVENOUS
  Administered 2021-11-24: 2 mg via INTRAVENOUS
  Administered 2021-11-24: 1 mg via INTRAVENOUS
  Administered 2021-11-25 (×2): 3 mg via INTRAVENOUS
  Administered 2021-11-25: 2 mg via INTRAVENOUS
  Filled 2021-11-24: qty 2
  Filled 2021-11-24: qty 1
  Filled 2021-11-24 (×2): qty 2
  Filled 2021-11-24: qty 1

## 2021-11-24 NOTE — Progress Notes (Addendum)
Pharmacy Antibiotic Note  Karen Casey is a 66 y.o. female admitted on 11/20/2021. Pharmacy has been consulted for Zosyn dosing for aspiration pneumonia.  Plan: Zosyn 3.375 g IV q8h extended infusion  Pharmacy to sign off consult but will continue to follow renal function, cultures and clinical progress for dosage adjustments or de-escalation as indicated.  Height: '5\' 3"'$  (160 cm) Weight: 75 kg (165 lb 5.5 oz) IBW/kg (Calculated) : 52.4  Temp (24hrs), Avg:98.2 F (36.8 C), Min:97.6 F (36.4 C), Max:99.4 F (37.4 C)  Recent Labs  Lab 11/20/21 2141 11/20/21 2152 11/20/21 2318 11/21/21 0410 11/22/21 0321 11/22/21 1003 11/22/21 1649 11/23/21 0303 11/23/21 0642 11/23/21 0915 11/23/21 1501 11/24/21 0240  WBC 16.5*  --   --  12.0* 17.2*  --   --  19.2*  --   --   --  18.3*  CREATININE 1.00  --   --  0.83 0.68  --  0.75 0.80  --   --  0.88 0.80  LATICACIDVEN  --    < > 6.4*  --   --  2.8* 2.8*  --  1.7 1.4  --   --    < > = values in this interval not displayed.    Estimated Creatinine Clearance: 69.8 mL/min (by C-G formula based on SCr of 0.8 mg/dL).    Allergies  Allergen Reactions   Capsaicin-Menthol Other (See Comments)    Burning and peeling on area applied to   Diclo Gel [Diclofenac Sodium] Other (See Comments)    GEL AND CREAM-burning and peeling at the sight of application.    Antimicrobials this admission: Zosyn 6/13 >> Ceftriaxone 6/9 >> 6/13 Azithromycin 6/9 >> 6/13   Microbiology results: 6/9 BCx: ngtd 6/12 Resp panel: negative 6/11 MRSA PCR: negative  Thank you for allowing pharmacy to be a part of this patient's care.  Tawnya Crook, PharmD, BCPS Clinical Pharmacist 11/24/2021 12:57 PM

## 2021-11-24 NOTE — Progress Notes (Addendum)
PROGRESS NOTE  Karen Casey  DOB: January 16, 1956  PCP: [name], MD RJJ:[id number]  DOA: 11/20/2021  LOS: 2 days  Hospital Day: 5  Brief narrative: Karen Casey is a 66 y.o. 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 6/10, EMS was called to her house and found to be lying on basis.  3 days prior, while under the influence of alcohol, she fell and was reportedly unable to get up. Her husband lives in the same house and reportedly gave up on taking care of her  CT scan chest showed scattered groundglass nodular opacities.   Placed on antibiotic for concern of pneumonia.   Admitted to hospitalist service   6/11, rapid response was called because of hypoxemia and increased work of breathing.  She required up to 15 L of oxygen and was transferred to the stepdown unit.   Her respiratory status gradually improved and she was taken off nonrebreather mask to low-flow oxygen.    CT angio of chest was obtained which did not show any pulmonary artery embolism but it showed extensive patchy groundglass infiltrates in both upper lobes suggestive of multifocal pneumonia.  Abdominal x-ray showed dilated small bowel loops suggestive of diffuse adynamic ileus.  Critical care following as well.  Subjective: Patient was seen and examined this morning. Lying down in bed.  Drowsy, was given IV Ativan earlier.  NG tube attached to low intermittent suction. Events from last night noted.  Patient was agitated and required as needed doses of IV Ativan. Repeat chest x-ray this morning showed progression of pneumonia  Principal Problem:   Right rib fracture Active Problems:   Elevated LFTs   Tobacco abuse   Alcohol use   Paroxysmal atrial fibrillation (HCC)   Chronic obstructive pulmonary disease, group B, by Global Initiative for Chronic Obstructive  Lung Disease 2017 classification ([place])   Hypokalemia   Community acquired pneumonia   Aspiration pneumonia ([place])   Acute respiratory failure with hypoxia (HCC)   Encephalopathy acute    Assessment and plan: Multifocal pneumonia Acute respiratory failure with hypoxia -Per history, patient was in bed for 3 days.  Probably aspirated.  May also have component of pulmonary edema. -6/9, CT chest with extensive bilateral infiltrates. -Currently on IV Rocephin and azithromycin. -6/13, chest x-ray showed worsening of bilateral infiltrates.  Noted a recommendation from PCCM to switch to IV Zosyn today -Required up to 15 L oxygen yesterday.  Gradually improving, currently on 8 L this morning. -Patient is also getting intermitting doses of IV Lasix. -Critical care following. -Continue bronchodilators,, IV steroids   Adynamic ileus  -Currently has NG tube attached to suction.   -Per nursing staff, patient had a bowel movement yesterday 6/12 however repeat abdominal x-ray this morning shows dilation of the small bowel loops and right colon without significant interval change.   -Monitor and replete electrolytes.  Potassium and magnesium level normal this morning.  Recent Labs  Lab 11/21/21 0410 11/22/21 0321 11/22/21 0958 11/22/21 1649 11/23/21 0303 11/23/21 1501 11/24/21 0240  K 3.0* 3.1*  --  4.1 4.9 4.2 4.4  MG 1.9  --  1.8  --  1.7 2.2 2.3  PHOS  --   --   --   --   --   --  4.3   Acute metabolic encephalopathy -Interactive mental status hard to sustain because of frequent agitation and need of IV Ativan/Precedex. -  Continue to monitor. -Ammonia level slightly elevated yesterday.  Expect improvement with bowel movement.  Repeat tomorrow Recent Labs  Lab 11/23/21 0303  AMMONIA 39*   Hepatic steatosis -Secondary to alcohol use.  Improving LFTs.  Continue to monitor Recent Labs  Lab 11/20/21 2141 11/21/21 0410 11/22/21 0321 11/23/21 0303  AST 393* 266* 100* 41  ALT 144* 110*  84* 54*  ALKPHOS 155* 131* 153* 130*  BILITOT 1.1 0.8 0.9 0.6  PROT 5.0* 4.5* 4.7* 4.5*  ALBUMIN 2.1* 1.9* 1.9* 1.7*   Chronic alcohol abuse -CIWA protocol  Paroxysmal Atrial Fibrillation -Likely due to heavy alcoholism.  Not on anticoagulation because of high risk of fall.  Not on any AV nodal blocking agent at home.     Vertebral compression fractures -Imaging noted multiple compression fractures are seen in the thoracic vertebrae particularly severe in the T6 vertebra with retropulsion of the posterior margin and spinal stenosis. This finding appears stable.  Goals of care   Code Status: Full Code    Mobility: Will benefit from PT eval when able to participate  Skin assessment:     Nutritional status:  Body mass index is 29.29 kg/m.          Diet:  Diet Order             Diet NPO time specified  Diet effective now                   DVT prophylaxis:  heparin injection 5,000 Units Start: 11/21/21 0600 SCDs Start: 11/21/21 0035   Antimicrobials: Rocephin azithromycin Fluid: Per critical care Consultants: Critical care Family Communication: None at bedside  Status is: Inpatient  Continue in-hospital care because: Hemodynamically unstable Level of care: Stepdown   Dispo: The patient is from: Home              Anticipated d/c is to: Pending clinical course              Patient currently is not medically stable to d/c.   Difficult to place patient No     Infusions:   dexmedetomidine (PRECEDEX) IV infusion Stopped (11/24/21 0454)   piperacillin-tazobactam (ZOSYN)  IV      Scheduled Meds:  arformoterol  15 mcg Nebulization BID   busPIRone  10 mg Per Tube TID   Chlorhexidine Gluconate Cloth  6 each Topical Daily   folic acid  1 mg Per Tube Daily   furosemide  40 mg Intravenous Q8H   gabapentin  600 mg Per Tube TID   guaiFENesin  15 mL Per Tube Q6H   heparin  5,000 Units Subcutaneous Q8H   methylPREDNISolone (SOLU-MEDROL) injection  20 mg  Intravenous BID   metoprolol tartrate  5 mg Intravenous Q6H   multivitamin  15 mL Per Tube Daily   nicotine  21 mg Transdermal Daily   nortriptyline  50 mg Per Tube QHS   pantoprazole (PROTONIX) IV  40 mg Intravenous Q12H   potassium chloride  40 mEq Per Tube BID   QUEtiapine  100 mg Per Tube QHS   revefenacin  175 mcg Nebulization Daily   thiamine  100 mg Per Tube Daily   Or   thiamine  100 mg Intravenous Daily    PRN meds: albuterol, ipratropium-albuterol, LORazepam **OR** LORazepam, ondansetron **OR** ondansetron (ZOFRAN) IV, oxyCODONE   Antimicrobials: Anti-infectives (From admission, onward)    Start     Dose/Rate Route Frequency Ordered Stop   11/24/21 1100  piperacillin-tazobactam (ZOSYN) IVPB 3.375  g        3.375 g 12.5 mL/hr over 240 Minutes Intravenous Every 8 hours 11/24/21 1006     11/21/21 2200  azithromycin (ZITHROMAX) 500 mg in sodium chloride 0.9 % 250 mL IVPB  Status:  Discontinued        500 mg 250 mL/hr over 60 Minutes Intravenous Every 24 hours 11/21/21 1231 11/24/21 1101   11/21/21 2200  cefTRIAXone (ROCEPHIN) 1 g in sodium chloride 0.9 % 100 mL IVPB  Status:  Discontinued        1 g 200 mL/hr over 30 Minutes Intravenous Every 24 hours 11/21/21 1231 11/24/21 0957   11/20/21 2330  azithromycin (ZITHROMAX) 500 mg in sodium chloride 0.9 % 250 mL IVPB        500 mg 250 mL/hr over 60 Minutes Intravenous  Once 11/20/21 2318 11/21/21 0752   11/20/21 2200  cefTRIAXone (ROCEPHIN) 1 g in sodium chloride 0.9 % 100 mL IVPB        1 g 200 mL/hr over 30 Minutes Intravenous  Once 11/20/21 2155 11/20/21 2253       Objective: Vitals:   11/24/21 0956 11/24/21 1000  BP:  113/72  Pulse: (!) 103 (!) 102  Resp: 20 (!) 21  Temp:    SpO2: 95% 97%    Intake/Output Summary (Last 24 hours) at 11/24/2021 1119 Last data filed at 11/24/2021 0955 Gross per 24 hour  Intake 982.91 ml  Output 1300 ml  Net -317.09 ml   Filed Weights   11/22/21 0601 11/23/21 0500 11/24/21  0358  Weight: 71.2 kg 74.9 kg 75 kg   Weight change: 0.1 kg Body mass index is 29.29 kg/m.   Physical Exam: General exam: Elderly Caucasian female.  NG suction in place Skin: No rashes, lesions or ulcers. HEENT: Atraumatic, normocephalic, no obvious bleeding Lungs: Diminished respiratory effort CVS: Tachycardic, no murmur GI/Abd soft, mild distention, bowel sound sluggish CNS: Drowsy, muffled voice Psychiatry: Sad affect Extremities: No edema, no calf tenderness  Data Review: I have personally reviewed the laboratory data and studies available.  F/u labs ordered Unresulted Labs (From admission, onward)     Start     Ordered   11/25/21 0500  Hepatic function panel  Tomorrow morning,   R       Question:  Specimen collection method  Answer:  Lab=Lab collect   11/24/21 1119   11/24/21 0500  CBC with Differential/Platelet  Daily,   R     Question:  Specimen collection method  Answer:  Lab=Lab collect   11/23/21 1639   11/24/21 1610  Basic metabolic panel  Daily,   R     Question:  Specimen collection method  Answer:  Lab=Lab collect   11/23/21 1639   11/23/21 1028  Legionella Pneumophila Serogp 1 Ur Ag  Once,   R        11/23/21 1027   11/23/21 0915  SARS Coronavirus 2 by RT PCR (hospital order, performed in [place] hospital lab) *cepheid single result test* Anterior Nasal Swab  (Tier 2 - Symptomatic/Asymptomatic)  Once,   R        11/23/21 0915   11/22/21 0846  Expectorated Sputum Assessment w Gram Stain, Rflx to Resp Cult  Once,   R        11/22/21 0845   11/21/21 1753  Expectorated Sputum Assessment w Gram Stain, Rflx to Resp Cult  Once,   R        11/21/21 1752  Unscheduled  Occult blood card to lab, stool RN will collect  As needed,   R     Question:  Specimen to be collected by:  Answer:  RN will collect   11/23/21 0238            Signed, Terrilee Croak, MD [facility] 11/24/2021

## 2021-11-24 NOTE — Progress Notes (Signed)
NAME:  Karen Casey, MRN:  [id number], DOB:  [birth year], LOS: 2 ADMISSION DATE:  11/20/2021, CONSULTATION DATE:  11/24/21 REFERRING MD:  Dr. Erlinda Hong - [facility], CHIEF COMPLAINT:  SOB   History of Present Illness:  66 y.o. woman admitted to the hospital with rib pain.  Fell 3 days prior.  EMS was called to house and patient was laying in feces.  Reportedly lying in bed for 3 days.  Was drinking and felt 3 days prior to not move since.  [place], creatinine relatively stable.  Chest x-ray demonstrated scattered nodular opacities.  CT scan was obtained showed scattered groundglass nodular opacities.  Placed on antibiotic for concern of pneumonia.  Patient has been nauseated with vomiting.  Reportedly preceded admission.  Certainly present last couple days.  Rapid response called 11/22/2021 AM in setting of hypoxemia, increased work of breathing.  Placed on nonrebreather at [place].  Brought to the stepdown unit.  On evaluation, mildly tachypneic.  Tachypnea gradually improved.  Nonrebreather was able to be taken off with O2 sats in the low to mid 90s on 15 L [place].  Chest x-ray obtained showed similar findings to prior on my review interpretation.  Abdominal film concerning for dilated bowel loops, possible ileus?  Pertinent  Medical History  Alcohol abuse, severe to moderate COPD on PFTs earlier in 2023  Significant Hospital Events: Including procedures, antibiotic start and stop dates in addition to other pertinent events   11/20/21 admitted to hospital after a fall at home oxygen alcohol, found in feces, reportedly on 3 days, treated for pneumonia 6/12 Transferred to ICU, on [place], worsening encephalopathy but stable on [place], started on steroids 6/13 Hallucinations overnight, tried Precedex yesterday but BP unable to tolerate, she is still confused but relatively calm this morning, remains on [place], remains on CAP coverage  Interim History / Subjective:  Agitated and hallucinating overnight, on Ativan, did not  have to resume Precedex Total of Lasix '80mg'$  given yesterday, 1.1L UOP Creatinine stable, remains  +6L Stable respiratory status, on 6L [place] however PNA appears progressed on this morning's CXR   Objective   Blood pressure 121/61, pulse 94, temperature 97.6 F (36.4 C), temperature source Axillary, resp. rate (!) 22, height '5\' 3"'$  (1.6 m), weight 75 kg, SpO2 98 %.        Intake/Output Summary (Last 24 hours) at 11/24/2021 0734 Last data filed at 11/24/2021 4098 Gross per 24 hour  Intake 1460.75 ml  Output 1100 ml  Net 360.75 ml    Filed Weights   11/22/21 0601 11/23/21 0500 11/24/21 0358  Weight: 71.2 kg 74.9 kg 75 kg    General:  ill-appearing elderly F, sitting up in bed on [place], alert but confused in no acute distress HEENT: MM pink/dry, sclera anicteric Neuro: awake, tracks with eyes, follows some commands, disoriented CV: s1s2 tachycardic, regular, no m/r/g PULM: coarse upper airway sounds and rhonchi throughout, no tachypnea or accessory muscle use on [place] GI: soft, bsx4 active, non-disteded Extremities: warm/dry, trace pre-tibial edema  Skin: no rashes or lesions     Labs reviewed Mag 2.2 WBC 18.3 Creatinine 0.8 CXR, personally reviewed, increased R-sided opacity  Resolved Hospital Problem list     Assessment & Plan:    Acute Hypoxic Respiratory Failure likely secondary to aspiration PNA vs CAP and COPD exacerbation superimposed on baseline COPD and tobacco abuse  In the setting of nausea and vomiting with acute decompensation.  High suspicion for aspiration pneumonitis, found lying in vomit.  Consider element of pulmonary edema.  CT chest 6/12 with extensive patchy ground-glass infiltrates are seen in the both upper lobes with significant interval worsening. Findings suggest multifocal pneumonia, possibly atypical viral pneumonia.  No PE -CXR today with worsening, though O2 requirement slightly better -responding to Lasix, continue diuresis with additional Lasix  '40mg'$  today -Given worsening CXR will change from Ceftriaxone to Zosyn -strep pneumo and RVP neg -follow respiratory cultures to completion -continue Brovana and Yupelri -continue Solumedrol for COPD exacerbation     Paroxysmal Atrial Fibrillation Chronic HFpEF Appears pt had an episode one year ago during hospitalization for withdrawal and was started on Eliquis.  She followed up with Cardiology and decision made to stop anti-coagulation as back in sinus, Chads-vasc score of 1 and hx of falls.  Appears did not follow up for stress test and holter monitor.  Last EF 60-65% -repeat EKG sinus tach -continue scheduled metoprolol -Lasix '40mg'$  -repeat Echo with preserved EF and diastolic dysfunction    Concern for Ileus -continue NG tube, had BM yesterday -repeat abd xray today, consider starting TF if improving  History of ETOH abuse Acute Encephalopathy CT head unremarkable -suspect ETOH withdrawal plus acute infection -on CIWA, intermittently on Precedex but causes hypotension so avoid as able -continue home neurontin and buspar, resume home Seroquel tonight -cont thiamine, folic acid   Best Practice (right click and "Reselect all SmartList Selections" daily)   Per primary  Labs   CBC: Recent Labs  Lab 11/20/21 2141 11/21/21 0410 11/22/21 0321 11/23/21 0303 11/24/21 0240  WBC 16.5* 12.0* 17.2* 19.2* 18.3*  NEUTROABS 13.6* 9.5*  --  15.9* 16.8*  HGB 14.7 12.7 14.1 13.2 12.9  HCT 41.8 36.6 40.3 40.3 39.3  MCV 96.8 98.9 96.6 103.1* 105.1*  PLT 233 170 209 189 157     Basic Metabolic Panel: Recent Labs  Lab 11/21/21 0410 11/22/21 0321 11/22/21 0958 11/22/21 1649 11/23/21 0303 11/23/21 1501 11/24/21 0240  NA 136 133*  --  133* 135 136 136  K 3.0* 3.1*  --  4.1 4.9 4.2 4.4  CL 98 97*  --  98 101 100 103  CO2 27 28  --  '25 25 27 26  '$ GLUCOSE 110* 124*  --  110* 95 98 107*  BUN 9 8  --  '9 11 14 16  '$ CREATININE 0.83 0.68  --  0.75 0.80 0.88 0.80  CALCIUM 7.0*  7.5*  --  7.5* 7.4* 7.8* 7.7*  MG 1.9  --  1.8  --  1.7 2.2 2.3  PHOS  --   --   --   --   --   --  4.3    GFR: Estimated Creatinine Clearance: 69.8 mL/min (by C-G formula based on SCr of 0.8 mg/dL). Recent Labs  Lab 11/21/21 0410 11/22/21 0321 11/22/21 1003 11/22/21 1649 11/23/21 0303 11/23/21 0642 11/23/21 0915 11/24/21 0240  PROCALCITON 10.80  --   --   --   --   --   --   --   WBC 12.0* 17.2*  --   --  19.2*  --   --  18.3*  LATICACIDVEN  --   --  2.8* 2.8*  --  1.7 1.4  --      Liver Function Tests: Recent Labs  Lab 11/20/21 2141 11/21/21 0410 11/22/21 0321 11/23/21 0303  AST 393* 266* 100* 41  ALT 144* 110* 84* 54*  ALKPHOS 155* 131* 153* 130*  BILITOT 1.1 0.8 0.9 0.6  PROT 5.0*  4.5* 4.7* 4.5*  ALBUMIN 2.1* 1.9* 1.9* 1.7*    Recent Labs  Lab 11/22/21 0958  LIPASE 20    Recent Labs  Lab 11/23/21 0303  AMMONIA 39*     ABG    Component Value Date/Time   PHART 7.41 11/22/2021 0824   PCO2ART 44 11/22/2021 0824   PO2ART 56 (L) 11/22/2021 0824   HCO3 27.9 11/22/2021 0824   TCO2 28 09/12/2020 1855   O2SAT 87.2 11/22/2021 0824     Coagulation Profile: Recent Labs  Lab 11/20/21 2141  INR 1.3*     Cardiac Enzymes: Recent Labs  Lab 11/20/21 2141  CKTOTAL 47     HbA1C: Hgb A1c MFr Bld  Date/Time Value Ref Range Status  09/17/2020 02:08 AM 5.2 4.8 - 5.6 % Final    Comment:    (NOTE) Pre diabetes:          5.7%-6.4%  Diabetes:              >6.4%  Glycemic control for   <7.0% adults with diabetes   06/20/2020 02:31 PM 5.3 4.8 - 5.6 % Final    Comment:             Prediabetes: 5.7 - 6.4          Diabetes: >6.4          Glycemic control for adults with diabetes: <7.0     CBG: Recent Labs  Lab 11/24/21 0534  GLUCAP 108*    Review of Systems:   Endorses chest discomfort, tightness.  No orthopnea or PND.  Past Medical History:  She,  has a past medical history of Alcohol abuse, Allergy, Anxiety, Cataract (06/09/2012), COPD  (chronic obstructive pulmonary disease) ([place]), Depression, Drug withdrawal seizure with complication ([place]) (1/60/1093), Dyspnea, GERD (gastroesophageal reflux disease), Headache, Hypertension, Long term (current) use of anticoagulants, Neuromuscular disorder ([place]), Rupture of appendix (06/09/2012), Seizure ([place]), Seizures ([place]), and Urinary incontinence (06/09/2012).   Surgical History:   Past Surgical History:  Procedure Laterality Date   APPENDECTOMY     BIOPSY  01/04/2021   Procedure: BIOPSY;  Surgeon: Yetta Flock, MD;  Location: [facility];  Service: Gastroenterology;;   BIOPSY  03/12/2021   Procedure: BIOPSY;  Surgeon: Irving Copas., MD;  Location: [facility];  Service: Gastroenterology;;   CATARACT EXTRACTION  06/09/2012   Left eye   COLONOSCOPY WITH PROPOFOL N/A 03/12/2021   Procedure: COLONOSCOPY WITH PROPOFOL;  Surgeon: Irving Copas., MD;  Location: [facility];  Service: Gastroenterology;  Laterality: N/A;   ESOPHAGOGASTRODUODENOSCOPY (EGD) WITH PROPOFOL N/A 01/04/2021   Procedure: ESOPHAGOGASTRODUODENOSCOPY (EGD) WITH PROPOFOL;  Surgeon: Yetta Flock, MD;  Location: [facility];  Service: Gastroenterology;  Laterality: N/A;   left shoulder dislocation  Sept 2011   POLYPECTOMY  03/12/2021   Procedure: POLYPECTOMY;  Surgeon: Mansouraty, Telford Nab., MD;  Location: [facility];  Service: Gastroenterology;;     Social History:   reports that she has been smoking cigarettes. She has a 21.50 pack-year smoking history. She has never used smokeless tobacco. She reports current alcohol use of about 3.0 standard drinks of alcohol per week. She reports current drug use. Drugs: Marijuana and Other-see comments.   Family History:  Her family history includes Aneurysm in her mother; Depression in her father; Heart disease in her father; Hyperlipidemia in her mother; Hypertension in her mother and sister; Parkinsonism in her father. There is no history of  Colon cancer, Esophageal cancer, Rectal cancer, or Stomach cancer.  Allergies Allergies  Allergen Reactions   Capsaicin-Menthol Other (See Comments)    Burning and peeling on area applied to   Diclo Gel [Diclofenac Sodium] Other (See Comments)    GEL AND CREAM-burning and peeling at the sight of application.     Home Medications  Prior to Admission medications   Medication Sig Start Date End Date Taking? Authorizing Provider  acetaminophen (TYLENOL) 650 MG CR tablet Take 1,950 mg by mouth 2 (two) times daily as needed for pain.   Yes [provider]  albuterol (VENTOLIN HFA) 108 (90 Base) MCG/ACT inhaler Inhale 2 puffs into the lungs every 6 (six) hours as needed for wheezing or shortness of breath. 09/23/21  Yes Pray, Norwood Levo, MD  ANORO ELLIPTA 62.5-25 MCG/ACT AEPB INHALE 1 PUFF BY MOUTH EVERY DAY Patient taking differently: Inhale 1 puff into the lungs every morning. 06/09/21  Yes Martyn Malay, MD  CALCIUM-MAGNESIUM-ZINC PO Take 1 tablet by mouth daily.   Yes [provider]  Cholecalciferol (VITAMIN D3 PO) Take 1 capsule by mouth daily.   Yes [provider]  famotidine (PEPCID) 20 MG tablet Take 20 mg by mouth 2 (two) times daily as needed for heartburn or indigestion.   Yes [provider]  feeding supplement (ENSURE ENLIVE / ENSURE PLUS) LIQD Take 237 mLs by mouth 2 (two) times daily between meals. Patient taking differently: Take 237 mLs by mouth daily as needed (meal supplement). 01/06/21  Yes Alen Bleacher, MD  gabapentin (NEURONTIN) 600 MG tablet Take 1 tablet (600 mg total) by mouth 3 (three) times daily. 10/15/21  Yes Pray, Norwood Levo, MD  Multiple Vitamins-Minerals (CENTRUM SILVER 50+WOMEN PO) Take 1 tablet by mouth daily.   Yes [provider]  nortriptyline (PAMELOR) 25 MG capsule TAKE 2 CAPSULES (50 MG TOTAL) BY MOUTH EVERY DAY AT BEDTIME Patient taking differently: Take 75 mg by mouth at bedtime. 11/10/21  Yes Pray, Norwood Levo,  MD  ondansetron (ZOFRAN-ODT) 8 MG disintegrating tablet Take 1 tablet (8 mg total) by mouth every 8 (eight) hours as needed. Patient taking differently: Take 8 mg by mouth every 8 (eight) hours as needed for nausea. 10/30/21  Yes Pray, Norwood Levo, MD  pantoprazole (PROTONIX) 40 MG tablet Take 1 tablet (40 mg total) by mouth every morning. 10/22/21  Yes Pray, Norwood Levo, MD  promethazine (PHENERGAN) 12.5 MG suppository Place 12.5 mg rectally daily as needed for nausea/vomiting. 01/26/21  Yes [provider]  QUEtiapine (SEROQUEL) 100 MG tablet Take 1 tablet (100 mg total) by mouth at bedtime for 28 days. 10/22/21 11/22/22 Yes Pray, Norwood Levo, MD  busPIRone (BUSPAR) 10 MG tablet TAKE 1 TABLET BY MOUTH THREE TIMES A DAY Patient taking differently: Take 10 mg by mouth 3 (three) times daily. 11/16/21   [name], MD  metoCLOPramide (REGLAN) 10 MG tablet Take 1 tablet (10 mg total) by mouth every 6 (six) hours as needed for nausea. Patient not taking: Reported on 10/22/2021 10/14/21   Tegeler, Gwenyth Allegra, MD  OXYGEN Inhale into the lungs as needed.    [provider]     Critical care time:  38 minutes    CRITICAL CARE Performed by: Otilio Carpen [place]   Total critical care time: 38 minutes  Critical care time was exclusive of separately billable procedures and treating other patients.  Critical care was necessary to treat or prevent imminent or life-threatening deterioration.  Critical care was time spent personally by me on the following activities: development  of treatment plan with patient and/or surrogate as well as nursing, discussions with consultants, evaluation of patient's response to treatment, examination of patient, obtaining history from patient or surrogate, ordering and performing treatments and interventions, ordering and review of laboratory studies, ordering and review of radiographic studies, pulse oximetry and re-evaluation of patient's condition.   Otilio Carpen  Hendy Brindle, PA-C [facility] See Amion for pager If no response to pager , please call [phone] until 7pm After 7:00 pm call Elink  [phone]?[place]

## 2021-11-24 NOTE — Progress Notes (Signed)
[facility] Progress Note Patient Name: Karen Casey DOB: 04-Oct-1955 MRN: [id number]   Date of Service  11/24/2021  HPI/Events of Note  Ileus - Patient has NGT to LIS. No order for NGT.  eICU Interventions  Will order NGT to LIS.     Intervention Category Major Interventions: Other:  Lysle Dingwall 11/24/2021, 11:53 PM

## 2021-11-24 NOTE — Progress Notes (Signed)
An USGPIV (ultrasound guided PIV) has been placed for short-term vasopressor infusion. A correctly placed ivWatch must be used when administering Vasopressors. Should this treatment be needed beyond 72 hours, central line access should be obtained.  It will be the responsibility of the bedside nurse to follow best practice to prevent extravasations.   ?

## 2021-11-24 NOTE — Progress Notes (Signed)
[facility] Progress Note Patient Name: Karen Casey DOB: [birth year] MRN: [id number]   Date of Service  11/24/2021  HPI/Events of Note  Patient remains hypotensive in spite of 800 mL of 1 liter bolus ordered. BP = 85/53 with MAP = 63.  eICU Interventions  Plan: Phenylephrine IV infusion via PIV. Titrate to MAP >= 65.     Intervention Category Major Interventions: Hypotension - evaluation and management  Lysle Dingwall 11/24/2021, 10:28 PM

## 2021-11-24 NOTE — Progress Notes (Signed)
eLink Physician-Brief Progress Note Patient Name: FLORENCIA ZACCARO DOB: December 13, 1955 MRN: [id number]   Date of Service  11/24/2021  HPI/Events of Note  Agitation - Patient trying to pull out NGT. Nursing request for wrist restraints.   eICU Interventions  Will order bilateral soft wrist restraints X 13 hours.     Intervention Category Major Interventions: Delirium, psychosis, severe agitation - evaluation and management  Lysle Dingwall 11/24/2021, 8:33 PM

## 2021-11-24 NOTE — Progress Notes (Signed)
[facility] Progress Note Patient Name: Karen Casey DOB: 19-Nov-1955 MRN: [id number]   Date of Service  11/24/2021  HPI/Events of Note  Multiple issues: 1. Hypotension - BP = 67/36 post Precedex and Ativan IV for NGT placement. Precedex IV infusion now on hold 2. Nursing request to review abdominal film for NGT placement. NGT tip in satisfactory position in gastric fundus with side port well past GE junction.   eICU Interventions  Plan: Bolus with 0.9 NaCl 1 liter IV over 1 hour now. Resume Precedex IV infusion when BP improves. OK to use NGT.     Intervention Category Major Interventions: Hypotension - evaluation and management  Lysle Dingwall 11/24/2021, 9:21 PM

## 2021-11-24 NOTE — Progress Notes (Deleted)
[facility] Progress Note Patient Name: Karen Casey DOB: 1956-05-20 MRN: [id number]   Date of Service  11/24/2021  HPI/Events of Note  Hypokalemia - K+ = 2.9 and Creatinine = 1.05.  eICU Interventions  Will replace K+.     Intervention Category Major Interventions: Electrolyte abnormality - evaluation and management  Lysle Dingwall 11/24/2021, 4:33 AM

## 2021-11-25 ENCOUNTER — Inpatient Hospital Stay (HOSPITAL_COMMUNITY): Payer: Medicare Other

## 2021-11-25 ENCOUNTER — Ambulatory Visit: Payer: 59 | Admitting: Family Medicine

## 2021-11-25 DIAGNOSIS — S2231XA Fracture of one rib, right side, initial encounter for closed fracture: Secondary | ICD-10-CM | POA: Diagnosis not present

## 2021-11-25 LAB — CBC WITH DIFFERENTIAL/PLATELET
Abs Immature Granulocytes: 0.34 10*3/uL — ABNORMAL HIGH (ref 0.00–0.07)
Basophils Absolute: 0.1 10*3/uL (ref 0.0–0.1)
Basophils Relative: 0 %
Eosinophils Absolute: 0 10*3/uL (ref 0.0–0.5)
Eosinophils Relative: 0 %
HCT: 38.4 % (ref 36.0–46.0)
Hemoglobin: 12.3 g/dL (ref 12.0–15.0)
Immature Granulocytes: 1 %
Lymphocytes Relative: 5 %
Lymphs Abs: 1.2 10*3/uL (ref 0.7–4.0)
MCH: 33.5 pg (ref 26.0–34.0)
MCHC: 32 g/dL (ref 30.0–36.0)
MCV: 104.6 fL — ABNORMAL HIGH (ref 80.0–100.0)
Monocytes Absolute: 1.4 10*3/uL — ABNORMAL HIGH (ref 0.1–1.0)
Monocytes Relative: 6 %
Neutro Abs: 21.6 10*3/uL — ABNORMAL HIGH (ref 1.7–7.7)
Neutrophils Relative %: 88 %
Platelets: 248 10*3/uL (ref 150–400)
RBC: 3.67 MIL/uL — ABNORMAL LOW (ref 3.87–5.11)
RDW: 13.6 % (ref 11.5–15.5)
WBC: 24.5 10*3/uL — ABNORMAL HIGH (ref 4.0–10.5)
nRBC: 0 % (ref 0.0–0.2)

## 2021-11-25 LAB — BASIC METABOLIC PANEL
Anion gap: 8 (ref 5–15)
Anion gap: 7 (ref 5–15)
BUN: UNDETERMINED mg/dL (ref 8–23)
BUN: 13 mg/dL (ref 8–23)
CO2: 27 mmol/L (ref 22–32)
CO2: 27 mmol/L (ref 22–32)
Calcium: 7.9 mg/dL — ABNORMAL LOW (ref 8.9–10.3)
Calcium: 7.5 mg/dL — ABNORMAL LOW (ref 8.9–10.3)
Chloride: 104 mmol/L (ref 98–111)
Chloride: 105 mmol/L (ref 98–111)
Creatinine, Ser: UNDETERMINED mg/dL (ref 0.44–1.00)
Creatinine, Ser: 0.53 mg/dL (ref 0.44–1.00)
GFR, Estimated: >60 mL/min (ref >60)
Glucose, Bld: 98 mg/dL (ref 70–99)
Glucose, Bld: 97 mg/dL (ref 70–99)
Potassium: 5.3 mmol/L — ABNORMAL HIGH (ref 3.5–5.1)
Potassium: 5 mmol/L (ref 3.5–5.1)
Sodium: 139 mmol/L (ref 135–145)
Sodium: 139 mmol/L (ref 135–145)

## 2021-11-25 LAB — GLUCOSE, CAPILLARY
Glucose-Capillary: 103 mg/dL — ABNORMAL HIGH (ref 70–99)
Glucose-Capillary: 105 mg/dL — ABNORMAL HIGH (ref 70–99)
Glucose-Capillary: 108 mg/dL — ABNORMAL HIGH (ref 70–99)
Glucose-Capillary: 110 mg/dL — ABNORMAL HIGH (ref 70–99)
Glucose-Capillary: 81 mg/dL (ref 70–99)
Glucose-Capillary: 81 mg/dL (ref 70–99)
Glucose-Capillary: 84 mg/dL (ref 70–99)

## 2021-11-25 LAB — BLOOD GAS, ARTERIAL
Acid-Base Excess: 2.5 mmol/L — ABNORMAL HIGH (ref 0.0–2.0)
Bicarbonate: 28.4 mmol/L — ABNORMAL HIGH (ref 20.0–28.0)
Drawn by: 23281
O2 Content: 8 L/min
O2 Saturation: 99.8 %
Patient temperature: 37
pCO2 arterial: 48 mmHg (ref 32–48)
pH, Arterial: 7.38 (ref 7.35–7.45)
pO2, Arterial: 106 mmHg (ref 83–108)

## 2021-11-25 LAB — CREATININE, SERUM
Creatinine, Ser: 0.69 mg/dL (ref 0.44–1.00)
GFR, Estimated: >60 mL/min (ref >60)

## 2021-11-25 LAB — HEPATIC FUNCTION PANEL
ALT: 37 U/L (ref 0–44)
AST: UNDETERMINED U/L (ref 15–41)
Albumin: 1.9 g/dL — ABNORMAL LOW (ref 3.5–5.0)
Alkaline Phosphatase: 132 U/L — ABNORMAL HIGH (ref 38–126)
Bilirubin, Direct: 0.3 mg/dL — ABNORMAL HIGH (ref 0.0–0.2)
Indirect Bilirubin: 0.7 mg/dL (ref 0.3–0.9)
Total Bilirubin: 1 mg/dL (ref 0.3–1.2)
Total Protein: 5.2 g/dL — ABNORMAL LOW (ref 6.5–8.1)

## 2021-11-25 LAB — MAGNESIUM: Magnesium: 1.9 mg/dL (ref 1.7–2.4)

## 2021-11-25 LAB — AST: AST: 31 U/L (ref 15–41)

## 2021-11-25 LAB — AMMONIA: Ammonia: 14 umol/L (ref 9–35)

## 2021-11-25 LAB — BUN: BUN: 15 mg/dL (ref 8–23)

## 2021-11-25 MED ORDER — LORAZEPAM 1 MG PO TABS
1.0000 mg | ORAL_TABLET | ORAL | Status: AC | PRN
  Administered 2021-11-25 (×2): 4 mg
  Filled 2021-11-25 (×2): qty 4

## 2021-11-25 MED ORDER — HALOPERIDOL LACTATE 5 MG/ML IJ SOLN
5.0000 mg | Freq: Four times a day (QID) | INTRAMUSCULAR | Status: DC | PRN
  Administered 2021-11-25: 5 mg via INTRAVENOUS
  Filled 2021-11-25: qty 1

## 2021-11-25 MED ORDER — LORAZEPAM 2 MG/ML IJ SOLN
1.0000 mg | INTRAMUSCULAR | Status: AC | PRN
  Administered 2021-11-25: 3 mg via INTRAVENOUS
  Administered 2021-11-26 (×2): 4 mg via INTRAVENOUS
  Filled 2021-11-25 (×3): qty 2

## 2021-11-25 NOTE — Progress Notes (Addendum)
[facility] Progress Note Patient Name: KYRIANA YANKEE DOB: Feb 02, 1956 MRN: [id number]   Date of Service  11/25/2021  HPI/Events of Note  Review of Head CT Scan w/o contrast reveals: 1. No acute intracranial pathology. 2. Mild predominantly bifrontal atrophy and chronic microvascular ischemic changes. Blood glucose = 81 which should not account for her mental status. Ammonia level = 14. ABG = 7.38/48/106/28.4 None of the above radiographic or lab finding account for her altered mental status. Suspect that her altered mental status is a result of sedation with Precedex, Ativan and Seroquel. Seroquel Q HS has already been D/Ced.   eICU Interventions  Plan: Continue to hold Precedex, Oxycodone and Ativan until mental status improves.      Intervention Category Major Interventions: Change in mental status - evaluation and management  Jenefer Woerner Eugene 11/25/2021, 1:27 AM

## 2021-11-25 NOTE — Progress Notes (Signed)
NAME:  NATILIE KRABBENHOFT, MRN:  [id number], DOB:  1956/06/12, LOS: 3 ADMISSION DATE:  11/20/2021, CONSULTATION DATE:  11/25/21 REFERRING MD:  Dr. Erlinda Hong - [facility], CHIEF COMPLAINT:  SOB   History of Present Illness:  66 y/o F admitted to the hospital 6/9 with rib pain.  S/p fall 3 days prior to presentation, had not moved since.  EMS was called to house and patient was laying in feces.  Reportedly lying in bed for 3 days.  Creatinine relatively stable.  Chest x-ray demonstrated scattered nodular opacities.  CT scan was obtained showed scattered groundglass nodular opacities.  Placed on antibiotic for concern of pneumonia.  Patient has been nauseated with vomiting.  Reportedly preceded admission.  Certainly present last couple days.  Rapid response called 11/22/2021 AM in setting of hypoxemia, increased work of breathing.  Placed on nonrebreather at [facility].  Brought to the stepdown unit.  On evaluation, mildly tachypneic but gradually improved.  Nonrebreather was able to be taken off with O2 sats in the low to mid 90s on 15 L [place].  Chest x-ray obtained showed similar findings to prior on my review interpretation.  Abdominal film concerning for dilated bowel loops, possible ileus?  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 Lasix '80mg'$  with 1.1L UOP, remains in positive balance.   Interim History / Subjective:  Afebrile, WBC increased to 24.5.   On zosyn.  Blood cultures pending  [place] 8L > weaned to 6L  Glucose range 81-110 I/O 2.1L UOP, 430 ml emesis, -458m in last 24 hours RN reports pt off precedex, did not tolerate due to reported hypotension.  Requiring PRN ativan.   Objective   Blood pressure 128/66, pulse (!) 106, temperature 97.6 F (36.4 C), temperature source Axillary, resp. rate (!) 22, height '5\' 3"'$  (1.6 m), weight 75.9 kg, SpO2 100 %.        Intake/Output Summary (Last 24 hours) at 11/25/2021 0915 Last data filed at 11/25/2021 0753 Gross per 24 hour  Intake 2186.36 ml  Output 2255 ml  Net -68.64 ml   Filed Weights   11/23/21 0500 11/24/21 0358 11/25/21 0500  Weight: 74.9 kg 75 kg 75.9 kg   Exam: General: chronically ill appearing female lying in bed in NAD  HEENT: MM pink/dry, NGT in place, anicteric, right pupil 484m left pupil 54m40moth briskly reactive Neuro: awake, alert. Able to state place.  Not oriented to events. Moving all extremities/generalized weakness.  CV: s1s2 RRR, no m/r/g PULM: non-labored at rest, lungs bilaterally with rhonchi GI: soft, bsx4 active  Extremities: warm/dry, trace to 1+ BLE pitting edema  Skin: no rashes or lesions  Resolved Hospital Problem list     Assessment & Plan:    Acute Hypoxic Respiratory Failure likely secondary to aspiration PNA vs CAP and COPD exacerbation superimposed on baseline COPD and tobacco abuse  In the setting of nausea and vomiting with acute decompensation.  High suspicion for aspiration pneumonitis, found lying  in vomit.  Consider element of pulmonary edema. Strep pneumo antigen negative. CT chest 6/12 with extensive patchy ground-glass infiltrates are seen in the both upper lobes with significant interval worsening. Findings suggest multifocal pneumonia, possibly atypical viral pneumonia. Although RVP negative.  PE negative.  -wean O2 for sats >90% -follow intermittent CXR  -lasix 40 mg IV Q8  -continue brovana, yupelri  -continue zosyn, D6  abx -await sputum culture -solumedrol 20 mg IV BID for AECOPD  Paroxysmal Atrial Fibrillation Chronic HFpEF Hx of an episode one year ago during hospitalization for withdrawal and was started on Eliquis.  She followed up with Cardiology and decision made to stop anti-coagulation as back in sinus, Chads-vasc score of 1 and hx of falls.  Appears did not follow up for stress test and holter monitor.  Last EF 60-65%, EKG ST.  -continue scheduled lopressor IV -tele monitoring  -lasix as above  -ECHO findings reviewed   Concern for Ileus -NGT to LIS  -now with liquid stool requiring flexi seal  -port abd 6/14 -pending above, consider TF vs trial PO's as patient is asking for food   History of ETOH abuse Acute Encephalopathy CT head unremarkable. Suspect ETOH w/d plus additional component of infection  -continue CIWA protocol  -continue home neurontin, buspar, seroquel  -folic acid, thiamine  -discontinue precedex off MAR, reported hypotension.  Best Practice (right click and "Reselect all SmartList Selections" daily)  Per primary  Critical care time:  n/a      Noe Gens, MSN, APRN, NP-C, AGACNP-BC  Pulmonary & Critical Care 11/25/2021, 9:15 AM   Please see [email or web address] for pager details.   From 7A-7P if no response, please call [phone] After hours, please call ELink [phone]

## 2021-11-25 NOTE — Progress Notes (Signed)
This RN notified Dr Chase Caller of a large mole on patient's right side with redness surrounding it. Dr Chase Caller voiced that he would assess the mole during his rounds.

## 2021-11-25 NOTE — Progress Notes (Addendum)
Noted pt's NG tube to be at 68cm instead of prior 61cm. Advanced tube back to 61cm, confirmed placement, and ordered abd xray to confirm.

## 2021-11-25 NOTE — Progress Notes (Signed)
[facility] Progress Note Patient Name: Karen Casey DOB: 07-Jan-1956 MRN: [id number]   Date of Service  11/25/2021  HPI/Events of Note  Hyperkalemia - K+ = 5.3. Patient on Lasix IV Q 8 hours.  eICU Interventions  Plan: D/C KCl replacement. Repeat BMP at 12 noon.     Intervention Category Major Interventions: Electrolyte abnormality - evaluation and management  Tajuan Dufault Eugene 11/25/2021, 5:49 AM

## 2021-11-25 NOTE — Progress Notes (Signed)
IV team consult for 2nd PIV access. Pt. Has significant bruising on bilateral hands and wrists. Pt. Has significant infiltration on upper left arm with no compressible vein suitable for PIV. Assessed bilateral arms with [place]. There are no suitable veins for PIV at this time. Notified RN.

## 2021-11-25 NOTE — Progress Notes (Signed)
CPT held at this time due to pt condition / pending CT trip.

## 2021-11-25 NOTE — Progress Notes (Signed)
PROGRESS NOTE    Karen Casey  TGY:[id number] DOB: 08/28/1955 DOA: 11/20/2021 PCP: [name], MD   Brief Narrative: 66 year old female with multiple previous chronic.  Prescription for benzodiazepine abuse. On the day of admission EMS was called who found her laying on the floor in vomitus unable to get up.  3 days prior to this patient had a fall. CT chest concerning for groundglass nodular opacities patient placed on antibiotics. On 11/22/2021 rapid response was called due to hypoxemia and increased work of breathing and increased oxygen demand to 15 L patient was transferred to stepdown unit. CT angiogram of the chest did not show pulmonary embolism but showed extensive patchy groundglass infiltrates in both upper lobes suggestive of multifocal pneumonia. Abdominal x-ray with dilated small bowel loops of severe diffuse adynamic ileus. 11/25/2021-she had altered mental status overnight.  Patient was on Ativan oxycodone and Seroquel and Precedex.  Seroquel has been stopped.  Potassium was 5.3 potassium replacement was stopped, patient is on Lasix every 8.  She had watery loose stools and Flexi-Seal was placed. Blood pressure remains soft hypotensive 85/53 she was started on phenylephrine IV infusion through the peripheral IV and IV fluid bolus was given. NG tube was placed for ileus. CT head showed no acute intracranial pathology.  Bifrontal atrophy and chronic microvascular ischemic changes. Ammonia level was normal. Echo with EF 60 to 65% no regional wall motion abnormalities.  Grade 1 diastolic dysfunction. Assessment & Plan:   Principal Problem:   Right rib fracture Active Problems:   Elevated LFTs   Tobacco abuse   Alcohol use   Paroxysmal atrial fibrillation (HCC)   Chronic obstructive pulmonary disease, group B, by Global Initiative for Chronic Obstructive Lung Disease 2017 classification ([place])   Hypokalemia   Community acquired pneumonia   Aspiration pneumonia ([place])    Acute respiratory failure with hypoxia ([place])   Encephalopathy acute  #1 acute hypoxic respiratory failure secondary to multifocal pneumonia likely from aspiration versus CAP Was on Rocephin and azithromycin Antibiotics switched to Zosyn due to persistent worsening of bilateral infiltrates and increasing oxygen demand O2 requirement was up to 15 L Continue steroids breathing treatments Lasix antibiotics Leukocytosis has been worsening since admission. White count 24 from 16. Check blood cultures and UA. MRSA PCR negative.  #2 adynamic ileus continue NG tube with suctioning.  Patient is having loose bowel movements Flexi-Seal in place. KUB ileus resolving.  Follow-up KUB in a.m.  #3 acute metabolic encephalopathy secondary to #1  #4 chronic alcohol abuse on CIWA protocol  #5 paroxysmal atrial fibrillation not on any anticoagulation due to heavy alcohol abuse and risk of fall.  #6 vertebral compression fractures supportive treatment   Estimated body mass index is 29.64 kg/m as calculated from the following:   Height as of this encounter: '5\' 3"'$  (1.6 m).   Weight as of this encounter: 75.9 kg.  DVT prophylaxis: Heparin  code Status: Full code  family Communication: None at bedside  disposition Plan:  Status is: Inpatient Remains inpatient appropriate because: NG tube IV antibiotics still hypoxic   Consultants:  PCCM  Procedures: NG tube placed 11/25/2021 Antimicrobials: Zosyn  Subjective: She is resting in bed she is awake she is alert she is talking she is able to follow some commands and answer questions appropriately. Overnight events noted.  Objective: Vitals:   11/25/21 1100 11/25/21 1121 11/25/21 1200 11/25/21 1224  BP: 112/72  111/69   Pulse:   (!) 102   Resp: (!) 22 20  19   Temp:      TempSrc:      SpO2: 94% 96% 91% 91%  Weight:      Height:        Intake/Output Summary (Last 24 hours) at 11/25/2021 1325 Last data filed at 11/25/2021 1209 Gross per 24 hour   Intake 2228.86 ml  Output 3055 ml  Net -826.14 ml   Filed Weights   11/23/21 0500 11/24/21 0358 11/25/21 0500  Weight: 74.9 kg 75 kg 75.9 kg    Examination: NG tube in place  General exam: Appears chronically ill-appearing  respiratory system: Rhonchi to auscultation. Respiratory effort normal. Cardiovascular system: S1 & S2 heard, RRR. No JVD, murmurs, rubs, gallops or clicks. No pedal edema. Gastrointestinal system: Abdomen is nondistended, soft and nontender. No organomegaly or masses felt. Normal bowel sounds heard. Central nervous system: Alert and oriented. No focal neurological deficits. Extremities: Trace edema  skin: No rashes, lesions or ulcers Psychiatry: unable to assess  Data Reviewed: I have personally reviewed following labs and imaging studies  CBC: Recent Labs  Lab 11/20/21 2141 11/21/21 0410 11/22/21 0321 11/23/21 0303 11/24/21 0240 11/25/21 0312  WBC 16.5* 12.0* 17.2* 19.2* 18.3* 24.5*  NEUTROABS 13.6* 9.5*  --  15.9* 16.8* 21.6*  HGB 14.7 12.7 14.1 13.2 12.9 12.3  HCT 41.8 36.6 40.3 40.3 39.3 38.4  MCV 96.8 98.9 96.6 103.1* 105.1* 104.6*  PLT 233 170 209 189 157 812   Basic Metabolic Panel: Recent Labs  Lab 11/22/21 0958 11/22/21 1649 11/23/21 0303 11/23/21 1501 11/24/21 0240 11/25/21 0312 11/25/21 0552 11/25/21 1139  NA  --    < > 135 136 136 139  --  139  K  --    < > 4.9 4.2 4.4 5.3*  --  5.0  CL  --    < > 101 100 103 104  --  105  CO2  --    < > '25 27 26 27  '$ --  27  GLUCOSE  --    < > 95 98 107* 98  --  97  BUN  --    < > '11 14 16 '$ QUANTITY NOT SUFFICIENT, UNABLE TO PERFORM TEST 15 13  CREATININE  --    < > 0.80 0.88 0.80 QUANTITY NOT SUFFICIENT, UNABLE TO PERFORM TEST 0.69 0.53  CALCIUM  --    < > 7.4* 7.8* 7.7* 7.9*  --  7.5*  MG 1.8  --  1.7 2.2 2.3 1.9  --   --   PHOS  --   --   --   --  4.3  --   --   --    < > = values in this interval not displayed.   GFR: Estimated Creatinine Clearance: 70.2 mL/min (by C-G formula based  on SCr of 0.53 mg/dL). Liver Function Tests: Recent Labs  Lab 11/20/21 2141 11/21/21 0410 11/22/21 0321 11/23/21 0303 11/25/21 0312 11/25/21 0552  AST 393* 266* 100* 41 QUANTITY NOT SUFFICIENT, UNABLE TO PERFORM TEST 31  ALT 144* 110* 84* 54* 37  --   ALKPHOS 155* 131* 153* 130* 132*  --   BILITOT 1.1 0.8 0.9 0.6 1.0  --   PROT 5.0* 4.5* 4.7* 4.5* 5.2*  --   ALBUMIN 2.1* 1.9* 1.9* 1.7* 1.9*  --    Recent Labs  Lab 11/22/21 0958  LIPASE 20   Recent Labs  Lab 11/23/21 0303 11/25/21 0040  AMMONIA 39* 14   Coagulation Profile: Recent Labs  Lab 11/20/21 2141  INR 1.3*   Cardiac Enzymes: Recent Labs  Lab 11/20/21 2141  CKTOTAL 47   BNP (last 3 results) No results for input(s): "PROBNP" in the last 8760 hours. HbA1C: No results for input(s): "HGBA1C" in the last 72 hours. CBG: Recent Labs  Lab 11/24/21 2045 11/25/21 0006 11/25/21 0516 11/25/21 0757 11/25/21 1256  GLUCAP 85 81 110* 108* 105*   Lipid Profile: No results for input(s): "CHOL", "HDL", "LDLCALC", "TRIG", "CHOLHDL", "LDLDIRECT" in the last 72 hours. Thyroid Function Tests: No results for input(s): "TSH", "T4TOTAL", "FREET4", "T3FREE", "THYROIDAB" in the last 72 hours. Anemia Panel: No results for input(s): "VITAMINB12", "FOLATE", "FERRITIN", "TIBC", "IRON", "RETICCTPCT" in the last 72 hours. Sepsis Labs: Recent Labs  Lab 11/21/21 0410 11/22/21 1003 11/22/21 1649 11/23/21 0642 11/23/21 0915  PROCALCITON 10.80  --   --   --   --   LATICACIDVEN  --  2.8* 2.8* 1.7 1.4    Recent Results (from the past 240 hour(s))  Culture, blood (Routine x 2)     Status: None (Preliminary result)   Collection Time: 11/20/21  8:58 PM   Specimen: BLOOD  Result Value Ref Range Status   Specimen Description   Final    BLOOD BLOOD RIGHT HAND Performed at [facility], [place] [address] [place], [place] [address]    Special Requests   Final    BOTTLES DRAWN AEROBIC AND ANAEROBIC Blood Culture  results may not be optimal due to an inadequate volume of blood received in culture bottles Performed at [place] [address], [place], [place] [address]    Culture   Final    NO GROWTH 4 DAYS Performed at [facility], [place] [address] [place], [place] [address]    Report Status PENDING  Incomplete  Culture, blood (Routine x 2)     Status: None (Preliminary result)   Collection Time: 11/20/21  9:03 PM   Specimen: BLOOD  Result Value Ref Range Status   Specimen Description   Final    BLOOD BLOOD LEFT HAND Performed at [place] [address], [place], [place] [address]    Special Requests   Final    BOTTLES DRAWN AEROBIC AND ANAEROBIC Blood Culture adequate volume Performed at [place] [address] [place], [place] [address]    Culture   Final    NO GROWTH 4 DAYS Performed at [facility], [place] [address], [place], [place] [address]    Report Status PENDING  Incomplete  Urine Culture     Status: Abnormal   Collection Time: 11/21/21  8:48 AM   Specimen: Urine, Clean Catch  Result Value Ref Range Status   Specimen Description   Final    URINE, CLEAN CATCH Performed at [facility], [place] [address] [place], [place] [address]    Special Requests   Final    NONE Performed at [facility], [place] [address] [place],  [address]    Culture MULTIPLE SPECIES PRESENT, SUGGEST RECOLLECTION (A)  Final   Report Status 11/22/2021 FINAL  Final  MRSA Next Gen by PCR, Nasal     Status: None   Collection Time: 11/22/21  9:38 AM   Specimen: Nasal Mucosa; Nasal Swab  Result Value Ref Range Status   MRSA by PCR Next Gen NOT DETECTED NOT DETECTED Final    Comment: (NOTE) The GeneXpert MRSA Assay (FDA approved for NASAL specimens  only), is one component of a comprehensive MRSA colonization surveillance program. It is not intended to diagnose MRSA infection nor to  guide or monitor treatment for MRSA infections. Test performance is not FDA approved in patients less than 30 years old. Performed at [facility], [place] [address], [place], [place] [address]   Respiratory (~20 pathogens) panel by PCR     Status: None   Collection Time: 11/23/21  9:16 AM   Specimen: Nasopharyngeal Swab; Respiratory  Result Value Ref Range Status   Adenovirus NOT DETECTED NOT DETECTED Final   Coronavirus 229E NOT DETECTED NOT DETECTED Final    Comment: (NOTE) The Coronavirus on the Respiratory Panel, DOES NOT test for the novel  Coronavirus (2019 nCoV)    Coronavirus HKU1 NOT DETECTED NOT DETECTED Final   Coronavirus NL63 NOT DETECTED NOT DETECTED Final   Coronavirus OC43 NOT DETECTED NOT DETECTED Final   Metapneumovirus NOT DETECTED NOT DETECTED Final   Rhinovirus / Enterovirus NOT DETECTED NOT DETECTED Final   Influenza A NOT DETECTED NOT DETECTED Final   Influenza B NOT DETECTED NOT DETECTED Final   Parainfluenza Virus 1 NOT DETECTED NOT DETECTED Final   Parainfluenza Virus 2 NOT DETECTED NOT DETECTED Final   Parainfluenza Virus 3 NOT DETECTED NOT DETECTED Final   Parainfluenza Virus 4 NOT DETECTED NOT DETECTED Final   Respiratory Syncytial Virus NOT DETECTED NOT DETECTED Final   Bordetella pertussis NOT DETECTED NOT DETECTED Final   Bordetella Parapertussis NOT DETECTED NOT DETECTED Final   Chlamydophila pneumoniae NOT DETECTED NOT DETECTED Final   Mycoplasma pneumoniae NOT DETECTED NOT DETECTED Final    Comment: Performed at [facility], [name]. [address] [place],  [address]         Radiology Studies: DG Abd Portable 1V  Result Date: 11/25/2021 CLINICAL DATA:  Follow-up ileus in a 66 year old female. EXAM: PORTABLE ABDOMEN - 1 VIEW COMPARISON:  November 24, 2021. FINDINGS: Gastric tube remains in place, tip in the mid stomach. Rectal management tube in place, balloon inflated to approximately 6.5 cm projecting over the lower  pelvis. Diminished diffuse gaseous distension of bowel loops. No signs of obstruction. Little if any dilated loops of small bowel are noted compatible with improving/resolving ileus. Potential RIGHT intrarenal calculus up to 4 mm. On limited assessment there is no acute skeletal process. IMPRESSION: 1. Improving/resolving ileus. 2. Rectal management tube with balloon inflated to 6.5 cm in the pelvis. Correlate with any pelvic discomfort with adjustment as needed. 3. Small RIGHT intrarenal calculus is suspected. These results will be called to the ordering clinician or representative by the Radiologist Assistant, and communication documented in the PACS or [employer]. Electronically Signed   By: Zetta Bills M.D.   On: 11/25/2021 10:53   CT HEAD WO CONTRAST (5MM)  Result Date: 11/25/2021 CLINICAL DATA:  Altered mental status. EXAM: CT HEAD WITHOUT CONTRAST TECHNIQUE: Contiguous axial images were obtained from the base of the skull through the vertex without intravenous contrast. RADIATION DOSE REDUCTION: This exam was performed according to the departmental dose-optimization program which includes automated exposure control, adjustment of the mA and/or kV according to patient size and/or use of iterative reconstruction technique. COMPARISON:  Head CT dated 11/20/2021. FINDINGS: Brain: Mild predominantly bifrontal atrophy. Mild chronic microvascular ischemic changes. There is no acute intracranial hemorrhage. No mass effect or midline shift. No extra-axial fluid collection. Vascular: No hyperdense vessel or unexpected calcification. Skull: Normal. Negative for fracture or focal lesion. Sinuses/Orbits: No acute finding. Other:  Nasogastric tube is partially visualized in the right nostril. IMPRESSION: 1. No acute intracranial pathology. 2. Mild predominantly bifrontal atrophy and chronic microvascular ischemic changes. Electronically Signed   By: Anner Crete M.D.   On: 11/25/2021 01:22   DG Abd 1  View  Result Date: 11/24/2021 CLINICAL DATA:  NG tube placement EXAM: ABDOMEN - 1 VIEW COMPARISON:  11/24/2021 FINDINGS: Esophageal tube tip and side port overlie the gastric fundus. Persistent moderate gaseous dilatation of the bowel. IMPRESSION: Esophageal tube tip overlies the gastric fundus. Electronically Signed   By: Donavan Foil M.D.   On: 11/24/2021 20:55   DG Abd 1 View  Result Date: 11/24/2021 CLINICAL DATA:  [id number] EXAM: ABDOMEN - 1 VIEW COMPARISON:  November 22, 2021 FINDINGS: Again seen is the NG tube with its tip at the distal gastric antrum region. There is mild dilatation of the small bowel loops and right colon seen without significant interval change. No radio-opaque calculi or other significant radiographic abnormality are seen. IMPRESSION: Mild dilatation of the small bowel loops and right colon likely on the basis of adynamic ileus without significant interval change Electronically Signed   By: Frazier Richards M.D.   On: 11/24/2021 11:13   DG CHEST PORT 1 VIEW  Result Date: 11/24/2021 CLINICAL DATA:  66 year old female with shortness of breath. EXAM: PORTABLE CHEST 1 VIEW COMPARISON:  CTA chest 11/22/2021 and earlier. FINDINGS: Portable AP semi upright view at 0539 hours. Low lung volumes. Enteric tube terminates in the stomach. Radiographically progressed patchy multifocal bilateral pulmonary opacity, characterized by CT 2 days ago and suspicious for multifocal viral/atypical pneumonia. Small right pleural effusion at that time likely unchanged. No pneumothorax. Stable visible bowel gas pattern. No acute osseous abnormality identified. IMPRESSION: 1. Enteric tube terminates in the stomach. 2. Low lung volumes with radiographic progression of bilateral Viral/atypical Pneumonia since 11/22/2021. Small right pleural effusion seen by CT likely unchanged. Electronically Signed   By: Genevie Ann M.D.   On: 11/24/2021 08:13        Scheduled Meds:  arformoterol  15 mcg Nebulization BID    busPIRone  10 mg Per Tube TID   Chlorhexidine Gluconate Cloth  6 each Topical Daily   folic acid  1 mg Per Tube Daily   furosemide  40 mg Intravenous Q8H   gabapentin  600 mg Per Tube TID   guaiFENesin  15 mL Per Tube Q6H   heparin  5,000 Units Subcutaneous Q8H   methylPREDNISolone (SOLU-MEDROL) injection  20 mg Intravenous BID   metoprolol tartrate  5 mg Intravenous Q6H   multivitamin  15 mL Per Tube Daily   nicotine  21 mg Transdermal Daily   nortriptyline  50 mg Per Tube QHS   pantoprazole (PROTONIX) IV  40 mg Intravenous Q12H   revefenacin  175 mcg Nebulization Daily   thiamine  100 mg Per Tube Daily   Or   thiamine  100 mg Intravenous Daily   Continuous Infusions:  sodium chloride Stopped (11/25/21 0135)   phenylephrine (NEO-SYNEPHRINE) Adult infusion Stopped (11/25/21 0931)   piperacillin-tazobactam (ZOSYN)  IV Stopped (11/25/21 1029)     LOS: 3 days    Time spent: 71 min  Georgette Shell, MD 11/25/2021, 1:25 PM

## 2021-11-25 NOTE — Progress Notes (Signed)
PT Cancellation Note  Patient Details Name: KEYARA ENT MRN: [id number] DOB: 06-19-1955   Cancelled Treatment:    Reason Eval/Treat Not Completed: Medical issues which prohibited therapy. Will check back another day.    [facility]  Office: [phone] Pager: [phone]

## 2021-11-25 NOTE — Progress Notes (Signed)
Pt asking to call her husband. This RN contacted her husband Elenore Rota and updated him. This RN explained to him that pt is still confused, then allowed pt to speak to him. Donald plans to visit in the morning.

## 2021-11-25 NOTE — Progress Notes (Signed)
[facility] Progress Note Patient Name: Karen Casey DOB: [birth year] MRN: [id number]   Date of Service  11/25/2021  HPI/Events of Note  Watery stools - Nursing request for Flexiseal.   eICU Interventions  Plan: Place Flexiseal.      Intervention Category Major Interventions: Other:  Lysle Dingwall 11/25/2021, 4:49 AM

## 2021-11-25 NOTE — Progress Notes (Addendum)
eLink Physician-Brief Progress Note Patient Name: Karen Casey DOB: 12-14-1955 MRN: [id number]   Date of Service  11/25/2021  HPI/Events of Note  Nursing reports pupils are unequal L pupil 2 mm and R pupil 5 mm. Nursing reports that patient was talking at the beginning or the shift and is now only responsive to pain. Precedex IV infusion off since 9 PM. BP = 134/95 with MAP = 108. Sat = 92% and RR - 23.  eICU Interventions  Plan: Blood glucose STAT.  ABG STAT.  Ammonia level STAT. D/C Seroquel Q HS. Head CT Scan w/o contrast STAT.     Intervention Category Major Interventions: Change in mental status - evaluation and management  Mirl Hillery Eugene 11/25/2021, 12:03 AM

## 2021-11-26 ENCOUNTER — Inpatient Hospital Stay (HOSPITAL_COMMUNITY): Payer: Medicare Other

## 2021-11-26 ENCOUNTER — Inpatient Hospital Stay: Payer: Self-pay

## 2021-11-26 ENCOUNTER — Inpatient Hospital Stay (HOSPITAL_COMMUNITY)
Admit: 2021-11-26 | Discharge: 2021-11-26 | Disposition: A | Discharge status: Home / Self Care | Payer: Medicare Other | Attending: Internal Medicine | Admitting: Internal Medicine

## 2021-11-26 DIAGNOSIS — G9340 Encephalopathy, unspecified: Secondary | ICD-10-CM

## 2021-11-26 DIAGNOSIS — S2231XA Fracture of one rib, right side, initial encounter for closed fracture: Secondary | ICD-10-CM | POA: Diagnosis not present

## 2021-11-26 LAB — CBC WITH DIFFERENTIAL/PLATELET
Abs Immature Granulocytes: 0.19 10*3/uL — ABNORMAL HIGH (ref 0.00–0.07)
Basophils Absolute: 0 10*3/uL (ref 0.0–0.1)
Basophils Relative: 0 %
Eosinophils Absolute: 0 10*3/uL (ref 0.0–0.5)
Eosinophils Relative: 0 %
HCT: 35.7 % — ABNORMAL LOW (ref 36.0–46.0)
Hemoglobin: 11.6 g/dL — ABNORMAL LOW (ref 12.0–15.0)
Immature Granulocytes: 2 %
Lymphocytes Relative: 9 %
Lymphs Abs: 0.9 10*3/uL (ref 0.7–4.0)
MCH: 33.6 pg (ref 26.0–34.0)
MCHC: 32.5 g/dL (ref 30.0–36.0)
MCV: 103.5 fL — ABNORMAL HIGH (ref 80.0–100.0)
Monocytes Absolute: 0.7 10*3/uL (ref 0.1–1.0)
Monocytes Relative: 7 %
Neutro Abs: 8 10*3/uL — ABNORMAL HIGH (ref 1.7–7.7)
Neutrophils Relative %: 82 %
Platelets: 145 10*3/uL — ABNORMAL LOW (ref 150–400)
RBC: 3.45 MIL/uL — ABNORMAL LOW (ref 3.87–5.11)
RDW: 13.8 % (ref 11.5–15.5)
WBC: 9.8 10*3/uL (ref 4.0–10.5)
nRBC: 0 % (ref 0.0–0.2)

## 2021-11-26 LAB — GLUCOSE, CAPILLARY
Glucose-Capillary: 107 mg/dL — ABNORMAL HIGH (ref 70–99)
Glucose-Capillary: 109 mg/dL — ABNORMAL HIGH (ref 70–99)
Glucose-Capillary: 74 mg/dL (ref 70–99)
Glucose-Capillary: 79 mg/dL (ref 70–99)
Glucose-Capillary: 85 mg/dL (ref 70–99)
Glucose-Capillary: 94 mg/dL (ref 70–99)

## 2021-11-26 LAB — BASIC METABOLIC PANEL
Anion gap: 13 (ref 5–15)
BUN: 10 mg/dL (ref 8–23)
CO2: 31 mmol/L (ref 22–32)
Calcium: 7.9 mg/dL — ABNORMAL LOW (ref 8.9–10.3)
Chloride: 99 mmol/L (ref 98–111)
Creatinine, Ser: 0.58 mg/dL (ref 0.44–1.00)
GFR, Estimated: >60 mL/min (ref >60)
Glucose, Bld: 85 mg/dL (ref 70–99)
Potassium: 3.5 mmol/L (ref 3.5–5.1)
Sodium: 143 mmol/L (ref 135–145)

## 2021-11-26 LAB — CULTURE, BLOOD (ROUTINE X 2)
Culture: NO GROWTH
Culture: NO GROWTH
Special Requests: ADEQUATE

## 2021-11-26 LAB — MAGNESIUM: Magnesium: 1.4 mg/dL — ABNORMAL LOW (ref 1.7–2.4)

## 2021-11-26 LAB — PHOSPHORUS: Phosphorus: 1.8 mg/dL — ABNORMAL LOW (ref 2.5–4.6)

## 2021-11-26 MED ORDER — OSMOLITE 1.5 CAL PO LIQD
1000.0000 mL | ORAL | Status: DC
  Administered 2021-11-26: 1000 mL
  Filled 2021-11-26 (×2): qty 1000

## 2021-11-26 MED ORDER — PROSOURCE TF PO LIQD
45.0000 mL | Freq: Two times a day (BID) | ORAL | Status: DC
  Administered 2021-11-26 – 2021-11-28 (×5): 45 mL
  Filled 2021-11-26 (×5): qty 45

## 2021-11-26 MED ORDER — FREE WATER
100.0000 mL | Freq: Four times a day (QID) | Status: DC
  Administered 2021-11-26 – 2021-11-27 (×5): 100 mL

## 2021-11-26 MED ORDER — SODIUM CHLORIDE 0.9% FLUSH
10.0000 mL | Freq: Two times a day (BID) | INTRAVENOUS | Status: DC
  Administered 2021-11-26: 30 mL
  Administered 2021-11-26 – 2021-11-27 (×2): 10 mL
  Administered 2021-11-27: 20 mL
  Administered 2021-11-28 – 2021-11-29 (×4): 10 mL
  Administered 2021-11-30: 20 mL
  Administered 2021-11-30 – 2021-12-01 (×2): 10 mL

## 2021-11-26 MED ORDER — VITAL HIGH PROTEIN PO LIQD: 1000.0000 mL | ORAL | Status: DC

## 2021-11-26 MED ORDER — SODIUM CHLORIDE 0.9% FLUSH: 10.0000 mL | INTRAVENOUS | Status: DC | PRN

## 2021-11-26 MED ORDER — FUROSEMIDE 10 MG/ML IJ SOLN
40.0000 mg | Freq: Every day | INTRAMUSCULAR | Status: DC
  Administered 2021-11-27 – 2021-12-01 (×5): 40 mg via INTRAVENOUS
  Filled 2021-11-26 (×5): qty 4

## 2021-11-26 NOTE — Evaluation (Signed)
Clinical/Bedside Swallow Evaluation Patient Details  Name: Karen Casey MRN: [id number] Date of Birth: [birth year]  Today's Date: 11/26/2021 Time: SLP Start Time (ACUTE ONLY): 7893 SLP Stop Time (ACUTE ONLY): 8101 SLP Time Calculation (min) (ACUTE ONLY): 33 min  Past Medical History:  Past Medical History:  Diagnosis Date   Alcohol abuse    last use 03/09/21, marijuana last 03/09/21   Allergy    Anxiety    Cataract 06/09/2012   Right eye and left eye   COPD (chronic obstructive pulmonary disease) ([place])    Depression    Drug withdrawal seizure with complication ([place]) 7/51/0258   Due to benzodiazepine withdrawal   Dyspnea    uses oxygen 2L via [place] prn   GERD (gastroesophageal reflux disease)    Headache    Hypertension    Long term (current) use of anticoagulants    Neuromuscular disorder ([place])    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   Seizure ([place])    08/2020 per patient   Seizures ([place])    xanax withdrawl- December 2013   Urinary incontinence 06/09/2012   Past Surgical History:  Past Surgical History:  Procedure Laterality Date   APPENDECTOMY     BIOPSY  01/04/2021   Procedure: BIOPSY;  Surgeon: Yetta Flock, MD;  Location: [facility];  Service: Gastroenterology;;   BIOPSY  03/12/2021   Procedure: BIOPSY;  Surgeon: Irving Copas., MD;  Location: [facility];  Service: Gastroenterology;;   CATARACT EXTRACTION  06/09/2012   Left eye   COLONOSCOPY WITH PROPOFOL N/A 03/12/2021   Procedure: COLONOSCOPY WITH PROPOFOL;  Surgeon: Irving Copas., MD;  Location: [facility];  Service: Gastroenterology;  Laterality: N/A;   ESOPHAGOGASTRODUODENOSCOPY (EGD) WITH PROPOFOL N/A 01/04/2021   Procedure: ESOPHAGOGASTRODUODENOSCOPY (EGD) WITH PROPOFOL;  Surgeon: Yetta Flock, MD;  Location: [facility];  Service: Gastroenterology;  Laterality: N/A;   left shoulder dislocation  Sept 2011   POLYPECTOMY  03/12/2021   Procedure:  POLYPECTOMY;  Surgeon: Mansouraty, Telford Nab., MD;  Location: [facility];  Service: Gastroenterology;;   HPI:  66 yo female adm to [facility] after being found down at home lying in feces. Pt has PMH + for ETOH, tobacco use, chrnoic smoking, dkysphagia, h/o pna - requiring intubation 3/29-4//06/2020.  Swallow eval ordered.  Per RN, pt's sister reports pt has problems swallowing prior to admission.  Pt underwent MBS 09/16/2020 with laryngeal penetration of thin, but strong swallow reflx - oral deficits with recommendation for dys2/thin without straws.  CT head showed bifrontal atrophy and chronic microvascular ischemic changes.  Imaging 11/20/2021 showed acute rib fx and ? mild esophagitis.  CT chest concerning for pna, right middle lobe and right lower lobe worsening ground glass opacities.  Pt has an NG in place that is suctioning some yellow fluid.  Pt tells this SLP that she wants to go hom and wants a smoke.    Assessment / Plan / Recommendation  Clinical Impression  Patient presents with clinical indications concerning for pharyngeal dysphagia - that may be exacerbated given her NG in place. Reviewed prior MBS from 09/2020 after intubation that showed delay in swallow and deep laryngeal penetration but strong pharyngeal swallow.  Per prior BSE and MBS, pt was NOT coughing with intake and MBS was completed due to her wheeze and respiratory concerns.  Today pt significantly dysarthric and demonstrates signs concerning for airway infiltration including weak coughing both immediately post swallow with all tsps of thin water, with 2/4 tsps  of nectar thck liquids and with final ice chip of "snack" given.  She did not swallow on command to determine if would be beneficial.  Given h/o dysphagia, NG tube, deconditioning with weak cough, cannot rule out overt aspiration.  Suspect she may be impaired epiglottic deflection contributing to aspiration.  Of note, pt heard to cough once after completion of swallow evaluation,  leading credence to aspiration concerns.  Recommend MBS prior to starting po diet x tsps of thin water and single ice chips only.  Will follow up for when pt is cleared for MBS - hopefully 6/16 am - hopefully with improved mentation. SLP Visit Diagnosis: Dysphagia, unspecified (R13.10);Dysphagia, oral phase (R13.11)    Aspiration Risk  Moderate aspiration risk    Diet Recommendation NPO;Other (Comment) (single ice chips, tsps of thin water only)   Liquid Administration via: Spoon;Other (Comment) (water via tsp) Medication Administration: Via alternative means    Other  Recommendations Oral Care Recommendations: Oral care QID;Oral care prior to ice chip/H20 Other Recommendations: Have oral suction available    Recommendations for follow up therapy are one component of a multi-disciplinary discharge planning process, led by the attending physician.  Recommendations may be updated based on patient status, additional functional criteria and insurance authorization.  Follow up Recommendations Skilled nursing-short term rehab (<3 hours/day)      Assistance Recommended at Discharge Frequent or constant Supervision/Assistance  Functional Status Assessment Patient has had a recent decline in their functional status and demonstrates the ability to make significant improvements in function in a reasonable and predictable amount of time.  Frequency and Duration min 2x/week  2 weeks       Prognosis Prognosis for Safe Diet Advancement: Guarded      Swallow Study   General Date of Onset: 11/26/21 HPI: 66 yo female adm to [facility] after being found down at home lying in feces. Pt has PMH + for ETOH, tobacco use, chrnoic smoking, dkysphagia, h/o pna - requiring intubation 3/29-4//06/2020.  Swallow eval ordered.  Per RN, pt's sister reports pt has problems swallowing prior to admission.  Pt underwent MBS 09/16/2020 with laryngeal penetration of thin, but strong swallow reflx - oral deficits with  recommendation for dys2/thin without straws.  CT head showed bifrontal atrophy and chronic microvascular ischemic changes.  Imaging 11/20/2021 showed acute rib fx and ? mild esophagitis.  CT chest concerning for pna, right middle lobe and right lower lobe worsening ground glass opacities.  Pt has an NG in place that is suctioning some yellow fluid.  Pt tells this SLP that she wants to go hom and wants a smoke. Type of Study: Bedside Swallow Evaluation Previous Swallow Assessment: see HPI Diet Prior to this Study: NPO Temperature Spikes Noted: No Respiratory Status: Nasal cannula History of Recent Intubation: No Behavior/Cognition: Alert;Confused;Impulsive;Requires cueing;Distractible Oral Cavity Assessment: Dry Oral Care Completed by SLP: No Oral Cavity - Dentition: Missing dentition;Other (Comment) (poor dentition and some missing teeth) Vision: Impaired for self-feeding Self-Feeding Abilities: Other (Comment) (pt has mitts in place for safety, did not remove) Patient Positioning: Upright in chair Baseline Vocal Quality: Low vocal intensity Volitional Cough: Weak Volitional Swallow: Unable to elicit    Oral/Motor/Sensory Function Overall Oral Motor/Sensory Function: Generalized oral weakness   Ice Chips Ice chips: Impaired Presentation: Spoon Pharyngeal Phase Impairments: Cough - Delayed   Thin Liquid Thin Liquid: Impaired Presentation: Spoon Oral Phase Impairments: Reduced lingual movement/coordination Oral Phase Functional Implications: Prolonged oral transit Pharyngeal  Phase Impairments: Cough - Immediate Other Comments: weak  cough observed immediately after swallow with 3/3 tsp boluses    Nectar Thick Nectar Thick Liquid: Impaired Presentation: Spoon Pharyngeal Phase Impairments: Cough - Delayed;Cough - Immediate Other Comments: delayed and immediate weak cough observed with 2/4 swallows   Honey Thick Honey Thick Liquid: Not tested   Puree Puree: Not tested   Solid      Solid: Not tested      Macario Golds 11/26/2021,12:48 PM  Kathleen Lime, MS [place] Office [phone] Pager [phone]

## 2021-11-26 NOTE — TOC Progression Note (Signed)
Transition of Care [facility]) - Progression Note    Patient Details  Name: Karen Casey MRN: [id number] Date of Birth: [birth year]  Transition of Care [facility]) CM/SW Contact  Sergio Zawislak, Juliann Pulse, RN Phone Number: 11/26/2021, 10:20 AM  Clinical Narrative: Noted await more medically stable prior intitiating ST SNF process.      Expected Discharge Plan: [place] Barriers to Discharge: Continued Medical Work up  Expected Discharge Plan and Services Expected Discharge Plan: [place] In-house Referral: Clinical Social Work     Living arrangements for the past 2 months: Single Family Home                 DME Arranged: N/A DME Agency: NA                   Social Determinants of Health (SDOH) Interventions    Readmission Risk Interventions    11/23/2021    2:58 PM  Readmission Risk Prevention Plan  Medication Review (RN Care Manager) Complete  HRI or Home Care Consult Complete  SW Recovery Care/Counseling Consult Complete  Palliative Care Screening Not Applicable  Skilled Nursing Facility Not Complete  SNF Comments Patient's orientation is fluctuating and patient was in mittens in last 24 hours.

## 2021-11-26 NOTE — Progress Notes (Signed)
Initial Nutrition Assessment  DOCUMENTATION CODES:   Not applicable  INTERVENTION:  - will order Osmolite 1.5 @ 20 ml/hr with 45 ml Prosource TF BID which will provide 800 kcal, 52 grams protein, and 367 ml free water.  - will monitor for ability to advance rate on 6/16.  - goal regimen: Osmolite 1.5 @ 60 ml/hr with 45 ml Prosource TF BID and 100 ml free water QID which will provide 1880 kcal, 112 grams protein, and 1500 ml free water.  - Monitor magnesium, potassium, and phosphorus BID for at least 3 days, MD to replete as needed, as pt is at risk for refeeding syndrome given hx of alcohol abuse and no nutrition x5 days.   NUTRITION DIAGNOSIS:   Inadequate oral intake related to inability to eat as evidenced by NPO status.  GOAL:   Patient will meet greater than or equal to 90% of their needs  MONITOR:   TF tolerance, Diet advancement, Labs, Weight trends  REASON FOR ASSESSMENT:   Consult Enteral/tube feeding initiation and management  ASSESSMENT:   66 year-old female with medical history of GERD, depression, anxiety, urinary incontinence, alcohol abuse, seizures, COPD, HTN, seizure, and neuromuscular disorder. She presented to the ED due to rib pain after a fall 3 days prior. EMS noted patient was laying in feces when they arrived. CXR showed scattered nodular opacities.  CT scan showed scattered ground-glass nodular opacities. Placed on abx for concern of PNA. She has experienced N/V since after presentation. Abdominal imaging concerning for dilated bowel loops and possible ileus.  Discussed patient with RN and CCM NP. Patient is noted to be a/o to self only and speech is mainly incomprehensible to RD during visit. No visitors present at the time of RD visit. EEG being finished at the time of RD visit.   NGT placed in L nare on 6/13 evening (gastric per abdominal x-ray 6/14). Patient was ordered a Regular diet on 6/10 at 0035, changed to FLD on 6/10 at 1240, and changed to  NPO on 6/11 at 0820. She has remained NPO since that time.   She was last seen by a [place] RD on 07/28/21.  Weight yesterday was 167 lb and weight on 6/11 was 157 lb. Weight yesterday was the highest weight since 07/27/21 at which time she weighed 166 lb.   Order currently in place  for Vital High Protein at 20 ml/hr; has not yet been started.    Per notes: - acute respiratory failure thought to be 2/2 aspiration PNA vs CAP and COPD exacerbation - afib - concern for ileus with plan to clamp NGT and then start TF until SLP able to evaluate - alcohol abuse with acute metabolic encephalopathy   Labs reviewed; CBGs: 85 and 74 mg/dl, Ca: 7.9 mg/dl.  Medications reviewed; 1 mg folvite/day, 40 mg IV lasix/day, 20 mg solu-medrol BID, 15 ml multivitamin per tube/day, 40 mg IV protonix BID, 100 mg thiamine/day.    NUTRITION - FOCUSED PHYSICAL EXAM:  Flowsheet Row Most Recent Value  Orbital Region No depletion  Upper Arm Region No depletion  Thoracic and Lumbar Region Unable to assess  Buccal Region No depletion  Temple Region No depletion  Clavicle Bone Region No depletion  Clavicle and Acromion Bone Region No depletion  Scapular Bone Region Unable to assess  Dorsal Hand Unable to assess  [bilateral mittens]  Patellar Region No depletion  Anterior Thigh Region No depletion  Posterior Calf Region No depletion  Edema (RD Assessment) Mild  [  BLE]  Hair Reviewed  Eyes Reviewed  Mouth Reviewed  [poor appearing dentition]  Skin Reviewed  Nails Reviewed       Diet Order:   Diet Order             Diet NPO time specified  Diet effective now                   EDUCATION NEEDS:   No education needs have been identified at this time  Skin:  Skin Assessment: Reviewed RN Assessment  Last BM:  6/14 (type 7 x2, both large amounts)  Height:   Ht Readings from Last 1 Encounters:  11/20/21 '5\' 3"'$  (1.6 m)    Weight:   Wt Readings from Last 1 Encounters:  11/25/21 75.9 kg      BMI:  Body mass index is 29.64 kg/m.  Estimated Nutritional Needs:  Kcal:  1900-2100 kcal Protein:  95-110 grams Fluid:  >/= 2 L/day     Jarome Matin, MS, RD, LDN Registered Dietitian II Inpatient Clinical Nutrition RD pager # and on-call/weekend pager # available in [facility]

## 2021-11-26 NOTE — Progress Notes (Signed)
Peripherally Inserted Central Catheter Placement  The IV Nurse has discussed with the patient and/or persons authorized to consent for the patient, the purpose of this procedure and the potential benefits and risks involved with this procedure.  The benefits include less needle sticks, lab draws from the catheter, and the patient may be discharged home with the catheter. Risks include, but not limited to, infection, bleeding, blood clot (thrombus formation), and puncture of an artery; nerve damage and irregular heartbeat and possibility to perform a PICC exchange if needed/ordered by physician.  Alternatives to this procedure were also discussed.  Bard Power PICC patient education guide, fact sheet on infection prevention and patient information card has been provided to patient /or left at bedside.    PICC Placement Documentation  PICC Double Lumen 08/67/61 Right Basilic 36 cm 6 cm (Active)  Indication for Insertion or Continuance of Line Vasoactive infusions 11/26/21 0900  Exposed Catheter (cm) 1 cm 11/26/21 0900  Site Assessment Clean, Dry, Intact 11/26/21 0900  Lumen #1 Status Flushed;Saline locked;Blood return noted 11/26/21 0900  Lumen #2 Status Flushed;Saline locked;Blood return noted 11/26/21 0900  Dressing Type Transparent;Securing device 11/26/21 0900  Dressing Status Antimicrobial disc in place 11/26/21 0900  Dressing Intervention New dressing;Other (Comment) 11/26/21 0900  Dressing Change Due 12/03/21 11/26/21 0900    Telephone consent signed by sister   Synthia Innocent 11/26/2021, 9:26 AM

## 2021-11-26 NOTE — Consult Note (Signed)
[facility] CM Inpatient Consult  11/26/2021  Karen Casey 08/09/1955 [id number]  [facility] CM)   Patient chart has been reviewed with noted high risk score for unplanned readmissions.  Patient assessed for community [place] follow up needs. Per review, current recommendation is for SNF. No [facility] CM needs.   Of note, [facility] Care Management services does not replace or interfere with any services that are arranged by inpatient case management or social work.    [name], MSN, RN [facility] Liaison Toll free office [phone]

## 2021-11-26 NOTE — Progress Notes (Signed)
PROGRESS NOTE    Karen Casey  XQJ:[id number] DOB: 02/26/1956 DOA: 11/20/2021 PCP: [name], MD   Brief Narrative: 66 year old female with multiple previous chronic.  Prescription for benzodiazepine abuse. On the day of admission EMS was called who found her laying on the floor in vomitus unable to get up.  3 days prior to this patient had a fall. CT chest concerning for groundglass nodular opacities patient placed on antibiotics. On 11/22/2021 rapid response was called due to hypoxemia and increased work of breathing and increased oxygen demand to 15 L patient was transferred to stepdown unit. CT angiogram of the chest did not show pulmonary embolism but showed extensive patchy groundglass infiltrates in both upper lobes suggestive of multifocal pneumonia. Abdominal x-ray with dilated small bowel loops of severe diffuse adynamic ileus. 11/25/2021-she had altered mental status overnight.  Patient was on Ativan oxycodone and Seroquel and Precedex.  Seroquel has been stopped.  Potassium was 5.3 potassium replacement was stopped, patient is on Lasix every 8.  She had watery loose stools and Flexi-Seal was placed. Blood pressure remains soft hypotensive 85/53 she was started on phenylephrine IV infusion through the peripheral IV and IV fluid bolus was given. NG tube was placed for ileus. CT head showed no acute intracranial pathology.  Bifrontal atrophy and chronic microvascular ischemic changes. Ammonia level was normal. Echo with EF 60 to 65% no regional wall motion abnormalities.  Grade 1 diastolic dysfunction. Assessment & Plan:   Principal Problem:   Right rib fracture Active Problems:   Elevated LFTs   Tobacco abuse   Alcohol use   Paroxysmal atrial fibrillation (HCC)   Chronic obstructive pulmonary disease, group B, by Global Initiative for Chronic Obstructive Lung Disease 2017 classification ([place])   Hypokalemia   Community acquired pneumonia   Aspiration pneumonia ([facility])    Acute respiratory failure with hypoxia ([facility])   Encephalopathy acute  #1 acute hypoxic respiratory failure secondary to multifocal pneumonia likely from aspiration versus CAP Was on Rocephin and azithromycin Antibiotics switched to Zosyn due to persistent worsening of bilateral infiltrates and increasing oxygen demand O2 requirement was up to 15 L Continue steroids breathing treatments Lasix antibiotics Leukocytosis has been worsening since admission. White count 24 from 16. Check blood cultures and UA. MRSA PCR negative. I discussed with her significant other abdominal patient and him has been living together for 30+ years and has a daughter who is 58 years old.  He says her baseline is normally she is mentally clear and is able to talk she does not walk much due to neuropathy she watches TV sitting up in her chair or sofa that is about it.  #2 adynamic ileus continue NG tube with suctioning.  Patient is having loose bowel movements Flexi-Seal in place.  Follow-up KUB no ileus  Kept NG tube in for medication access  #3 acute metabolic encephalopathy secondary to #1  #4 chronic alcohol abuse on CIWA protocol  #5 paroxysmal atrial fibrillation not on any anticoagulation due to heavy alcohol abuse and risk of fall.  #6 vertebral compression fractures supportive treatment  #7 n.p.o. status NG tube in place for feeds and medications   Estimated body mass index is 29.64 kg/m as calculated from the following:   Height as of this encounter: '5\' 3"'$  (1.6 m).   Weight as of this encounter: 75.9 kg.  DVT prophylaxis: Heparin  code Status: Full code  family Communication: None at bedside  disposition Plan:  Status is: Inpatient Remains inpatient appropriate  because: NG tube IV antibiotics still hypoxic   Consultants:  PCCM  Procedures: NG tube placed 11/25/2021 Antimicrobials: Zosyn  Subjective:  WBCs 9 from 24 Awake Words are not clear Moves all extremities and follow some  commands EEG today no definite evidence of seizure activity Seen by speech recommending n.p.o.  Objective: Vitals:   11/26/21 0900 11/26/21 1000 11/26/21 1100 11/26/21 1212  BP:  (!) 142/89    Pulse: (!) 104 (!) 108 (!) 104   Resp: (!) 22 (!) 22 (!) 24   Temp:    (!) 97.3 F (36.3 C)  TempSrc:    Axillary  SpO2: 100% 95% 95%   Weight:      Height:        Intake/Output Summary (Last 24 hours) at 11/26/2021 1251 Last data filed at 11/26/2021 1004 Gross per 24 hour  Intake 149.73 ml  Output 2150 ml  Net -2000.27 ml    Filed Weights   11/23/21 0500 11/24/21 0358 11/25/21 0500  Weight: 74.9 kg 75 kg 75.9 kg    Examination: NG tube in place Rectal tube with liquid stool  General exam: Appears chronically ill-appearing  respiratory system: Rhonchi to auscultation. Respiratory effort normal. Cardiovascular system: S1 & S2 heard, RRR. No JVD, murmurs, rubs, gallops or clicks. No pedal edema. Gastrointestinal system: Abdomen is nondistended, soft and nontender. No organomegaly or masses felt. Normal bowel sounds heard. Central nervous system: Alert and oriented. No focal neurological deficits. Extremities: Trace edema  skin: No rashes, lesions or ulcers Psychiatry: unable to assess  Data Reviewed: I have personally reviewed following labs and imaging studies  CBC: Recent Labs  Lab 11/21/21 0410 11/22/21 0321 11/23/21 0303 11/24/21 0240 11/25/21 0312 11/26/21 0333  WBC 12.0* 17.2* 19.2* 18.3* 24.5* 9.8  NEUTROABS 9.5*  --  15.9* 16.8* 21.6* 8.0*  HGB 12.7 14.1 13.2 12.9 12.3 11.6*  HCT 36.6 40.3 40.3 39.3 38.4 35.7*  MCV 98.9 96.6 103.1* 105.1* 104.6* 103.5*  PLT 170 209 189 157 248 145*    Basic Metabolic Panel: Recent Labs  Lab 11/22/21 0958 11/22/21 1649 11/23/21 0303 11/23/21 1501 11/24/21 0240 11/25/21 0312 11/25/21 0552 11/25/21 1139 11/26/21 0333  NA  --    < > 135 136 136 139  --  139 143  K  --    < > 4.9 4.2 4.4 5.3*  --  5.0 3.5  CL  --    < >  101 100 103 104  --  105 99  CO2  --    < > '25 27 26 27  '$ --  27 31  GLUCOSE  --    < > 95 98 107* 98  --  97 85  BUN  --    < > '11 14 16 '$ QUANTITY NOT SUFFICIENT, UNABLE TO PERFORM TEST '15 13 10  '$ CREATININE  --    < > 0.80 0.88 0.80 QUANTITY NOT SUFFICIENT, UNABLE TO PERFORM TEST 0.69 0.53 0.58  CALCIUM  --    < > 7.4* 7.8* 7.7* 7.9*  --  7.5* 7.9*  MG 1.8  --  1.7 2.2 2.3 1.9  --   --   --   PHOS  --   --   --   --  4.3  --   --   --   --    < > = values in this interval not displayed.    GFR: Estimated Creatinine Clearance: 70.2 mL/min (by C-G formula based on SCr of 0.58  mg/dL). Liver Function Tests: Recent Labs  Lab 11/20/21 2141 11/21/21 0410 11/22/21 0321 11/23/21 0303 11/25/21 0312 11/25/21 0552  AST 393* 266* 100* 41 QUANTITY NOT SUFFICIENT, UNABLE TO PERFORM TEST 31  ALT 144* 110* 84* 54* 37  --   ALKPHOS 155* 131* 153* 130* 132*  --   BILITOT 1.1 0.8 0.9 0.6 1.0  --   PROT 5.0* 4.5* 4.7* 4.5* 5.2*  --   ALBUMIN 2.1* 1.9* 1.9* 1.7* 1.9*  --     Recent Labs  Lab 11/22/21 0958  LIPASE 20    Recent Labs  Lab 11/23/21 0303 11/25/21 0040  AMMONIA 39* 14    Coagulation Profile: Recent Labs  Lab 11/20/21 2141  INR 1.3*    Cardiac Enzymes: Recent Labs  Lab 11/20/21 2141  CKTOTAL 47    BNP (last 3 results) No results for input(s): "PROBNP" in the last 8760 hours. HbA1C: No results for input(s): "HGBA1C" in the last 72 hours. CBG: Recent Labs  Lab 11/25/21 1937 11/25/21 2333 11/26/21 0348 11/26/21 0753 11/26/21 1206  GLUCAP 81 84 85 74 79    Lipid Profile: No results for input(s): "CHOL", "HDL", "LDLCALC", "TRIG", "CHOLHDL", "LDLDIRECT" in the last 72 hours. Thyroid Function Tests: No results for input(s): "TSH", "T4TOTAL", "FREET4", "T3FREE", "THYROIDAB" in the last 72 hours. Anemia Panel: No results for input(s): "VITAMINB12", "FOLATE", "FERRITIN", "TIBC", "IRON", "RETICCTPCT" in the last 72 hours. Sepsis Labs: Recent Labs  Lab  11/21/21 0410 11/22/21 1003 11/22/21 1649 11/23/21 0642 11/23/21 0915  PROCALCITON 10.80  --   --   --   --   LATICACIDVEN  --  2.8* 2.8* 1.7 1.4     Recent Results (from the past 240 hour(s))  Culture, blood (Routine x 2)     Status: None   Collection Time: 11/20/21  8:58 PM   Specimen: BLOOD  Result Value Ref Range Status   Specimen Description   Final    BLOOD BLOOD RIGHT HAND Performed at [facility], [place] [address] [place], [place] [address]    Special Requests   Final    BOTTLES DRAWN AEROBIC AND ANAEROBIC Blood Culture results may not be optimal due to an inadequate volume of blood received in culture bottles Performed at [place] [address] [place], [place] [address]    Culture   Final    NO GROWTH 5 DAYS Performed at [facility], [place] [address], [place], [place] [address]    Report Status 11/26/2021 FINAL  Final  Culture, blood (Routine x 2)     Status: None   Collection Time: 11/20/21  9:03 PM   Specimen: BLOOD  Result Value Ref Range Status   Specimen Description   Final    BLOOD BLOOD LEFT HAND Performed at [place] [address] [place], [place] [address]    Special Requests   Final    BOTTLES DRAWN AEROBIC AND ANAEROBIC Blood Culture adequate volume Performed at [place] [address], [place], [place] [address]    Culture   Final    NO GROWTH 5 DAYS Performed at [facility], [place] [address] [place], [place] [address]    Report Status 11/26/2021 FINAL  Final  Urine Culture     Status: Abnormal   Collection Time: 11/21/21  8:48 AM   Specimen: Urine, Clean Catch  Result Value Ref Range Status   Specimen Description   Final  URINE, CLEAN CATCH Performed at [facility], [place] [address], [place], [place] [address]    Special Requests   Final    NONE Performed at [facility], [place] [address]  [place], [place] [address]    Culture MULTIPLE SPECIES PRESENT, SUGGEST RECOLLECTION (A)  Final   Report Status 11/22/2021 FINAL  Final  MRSA Next Gen by PCR, Nasal     Status: None   Collection Time: 11/22/21  9:38 AM   Specimen: Nasal Mucosa; Nasal Swab  Result Value Ref Range Status   MRSA by PCR Next Gen NOT DETECTED NOT DETECTED Final    Comment: (NOTE) The GeneXpert MRSA Assay (FDA approved for NASAL specimens only), is one component of a comprehensive MRSA colonization surveillance program. It is not intended to diagnose MRSA infection nor to guide or monitor treatment for MRSA infections. Test performance is not FDA approved in patients less than 49 years old. Performed at [facility], [place] [address], [place], [place] [address]   Respiratory (~20 pathogens) panel by PCR     Status: None   Collection Time: 11/23/21  9:16 AM   Specimen: Nasopharyngeal Swab; Respiratory  Result Value Ref Range Status   Adenovirus NOT DETECTED NOT DETECTED Final   Coronavirus 229E NOT DETECTED NOT DETECTED Final    Comment: (NOTE) The Coronavirus on the Respiratory Panel, DOES NOT test for the novel  Coronavirus (2019 nCoV)    Coronavirus HKU1 NOT DETECTED NOT DETECTED Final   Coronavirus NL63 NOT DETECTED NOT DETECTED Final   Coronavirus OC43 NOT DETECTED NOT DETECTED Final   Metapneumovirus NOT DETECTED NOT DETECTED Final   Rhinovirus / Enterovirus NOT DETECTED NOT DETECTED Final   Influenza A NOT DETECTED NOT DETECTED Final   Influenza B NOT DETECTED NOT DETECTED Final   Parainfluenza Virus 1 NOT DETECTED NOT DETECTED Final   Parainfluenza Virus 2 NOT DETECTED NOT DETECTED Final   Parainfluenza Virus 3 NOT DETECTED NOT DETECTED Final   Parainfluenza Virus 4 NOT DETECTED NOT DETECTED Final   Respiratory Syncytial Virus NOT DETECTED NOT DETECTED Final   Bordetella pertussis NOT DETECTED NOT DETECTED Final   Bordetella Parapertussis NOT DETECTED NOT DETECTED Final    Chlamydophila pneumoniae NOT DETECTED NOT DETECTED Final   Mycoplasma pneumoniae NOT DETECTED NOT DETECTED Final    Comment: Performed at [facility], [place]. [address], [place], [place] [address]         Radiology Studies: EEG adult  Result Date: 12/07/2021 Lora Havens, MD     2021/12/07 12:23 PM Patient Name: Karen Casey MRN: [id number] Epilepsy Attending: Lora Havens Referring Physician/Provider: Brand Males, MD Date: 2021/12/07 Duration: 22.24 mins Patient history: 66 year old female with altered mental status.  EEG to evaluate for seizure. Level of alertness: Awake AEDs during EEG study: GBP, Ativan Technical aspects: This EEG study was done with scalp electrodes positioned according to the 10-20 International system of electrode placement. Electrical activity was acquired at a sampling rate of '500Hz'$  and reviewed with a high frequency filter of '70Hz'$  and a low frequency filter of '1Hz'$ . EEG data were recorded continuously and digitally stored. Description: The posterior dominant rhythm consists of 8 Hz activity of moderate voltage (25-35 uV) seen predominantly in posterior head regions, symmetric and reactive to eye opening and eye closing.  EEG showed intermittent generalized 5 to 7 Hz theta slowing. Hyperventilation and photic stimulation were not performed.   ABNORMALITY - Intermittent slow, generalized IMPRESSION: This  study is suggestive of mild diffuse encephalopathy, nonspecific etiology. No seizures or epileptiform discharges were seen throughout the recording. Priyanka O Yadav   [place] EKG SITE RITE  Result Date: 11/26/2021 If Site Rite image not attached, placement could not be confirmed due to current cardiac rhythm.  DG Abd 1 View  Result Date: 11/26/2021 CLINICAL DATA:  Nausea and vomiting. EXAM: ABDOMEN - 1 VIEW COMPARISON:  Study from yesterday at 9:34 p.m. FINDINGS: 4:30 a.m. 11/26/2021. NGT tip is in the body of stomach as before. There is a general paucity  of bowel aeration. Scattered bowel aeration in the lower abdomen and pelvis is noted. There is no supine evidence of free air. No pathologic calcification. Visceral shadows are stable. IMPRESSION: 1. NGT tip in the body of stomach, unchanged. 2. General paucity of bowel aeration particularly in the abdomen. Electronically Signed   By: Telford Nab M.D.   On: 11/26/2021 07:06   DG Abd 1 View  Result Date: 11/26/2021 CLINICAL DATA:  Check NG placement. EXAM: ABDOMEN - 1 VIEW COMPARISON:  Earlier study today at 4:30 a.m. FINDINGS: 5:48 a.m., 11/26/2021. The lower half of the true pelvis and portion of the lateral right hemiabdomen are excluded from the exam. NGT is within the stomach. The proximal side-hole is 5.3 cm below the level of the hiatus and the tip is in the body of the stomach. There is a general paucity of bowel gas. Visceral shadows are stable. There is no supine evidence of free air. Osteopenia and degenerative change dorsal spine. IMPRESSION: 1. NGT appears adequately inserted. 2. General paucity of bowel gas. Minimal small bowel aeration in right lower quadrant is noted. Electronically Signed   By: Telford Nab M.D.   On: 11/26/2021 06:26   DG Abd 1 View  Result Date: 11/25/2021 CLINICAL DATA:  NG placement. EXAM: ABDOMEN - 1 VIEW COMPARISON:  Earlier radiograph dated 11/25/2021. FINDINGS: Enteric tube with tip in the body of the stomach. No bowel dilatation. Scattered reticulonodular densities noted at the visualized lung bases. Clinical correlation is recommended. IMPRESSION: 1. Enteric tube with tip in the body of the stomach. 2. Scattered reticulonodular densities at the lung bases. Electronically Signed   By: Anner Crete M.D.   On: 11/25/2021 22:17   DG Abd Portable 1V  Result Date: 11/25/2021 CLINICAL DATA:  Follow-up ileus in a 66 year old female. EXAM: PORTABLE ABDOMEN - 1 VIEW COMPARISON:  November 24, 2021. FINDINGS: Gastric tube remains in place, tip in the mid stomach.  Rectal management tube in place, balloon inflated to approximately 6.5 cm projecting over the lower pelvis. Diminished diffuse gaseous distension of bowel loops. No signs of obstruction. Little if any dilated loops of small bowel are noted compatible with improving/resolving ileus. Potential RIGHT intrarenal calculus up to 4 mm. On limited assessment there is no acute skeletal process. IMPRESSION: 1. Improving/resolving ileus. 2. Rectal management tube with balloon inflated to 6.5 cm in the pelvis. Correlate with any pelvic discomfort with adjustment as needed. 3. Small RIGHT intrarenal calculus is suspected. These results will be called to the ordering clinician or representative by the Radiologist Assistant, and communication documented in the PACS or [employer]. Electronically Signed   By: Zetta Bills M.D.   On: 11/25/2021 10:53   CT HEAD WO CONTRAST (5MM)  Result Date: 11/25/2021 CLINICAL DATA:  Altered mental status. EXAM: CT HEAD WITHOUT CONTRAST TECHNIQUE: Contiguous axial images were obtained from the base of the skull through the vertex without intravenous contrast. RADIATION DOSE  REDUCTION: This exam was performed according to the departmental dose-optimization program which includes automated exposure control, adjustment of the mA and/or kV according to patient size and/or use of iterative reconstruction technique. COMPARISON:  Head CT dated 11/20/2021. FINDINGS: Brain: Mild predominantly bifrontal atrophy. Mild chronic microvascular ischemic changes. There is no acute intracranial hemorrhage. No mass effect or midline shift. No extra-axial fluid collection. Vascular: No hyperdense vessel or unexpected calcification. Skull: Normal. Negative for fracture or focal lesion. Sinuses/Orbits: No acute finding. Other: Nasogastric tube is partially visualized in the right nostril. IMPRESSION: 1. No acute intracranial pathology. 2. Mild predominantly bifrontal atrophy and chronic microvascular ischemic  changes. Electronically Signed   By: Anner Crete M.D.   On: 11/25/2021 01:22   DG Abd 1 View  Result Date: 11/24/2021 CLINICAL DATA:  NG tube placement EXAM: ABDOMEN - 1 VIEW COMPARISON:  11/24/2021 FINDINGS: Esophageal tube tip and side port overlie the gastric fundus. Persistent moderate gaseous dilatation of the bowel. IMPRESSION: Esophageal tube tip overlies the gastric fundus. Electronically Signed   By: Donavan Foil M.D.   On: 11/24/2021 20:55        Scheduled Meds:  arformoterol  15 mcg Nebulization BID   busPIRone  10 mg Per Tube TID   Chlorhexidine Gluconate Cloth  6 each Topical Daily   feeding supplement (OSMOLITE 1.5 CAL)  1,000 mL Per Tube Q24H   feeding supplement (PROSource TF)  45 mL Per Tube BID   folic acid  1 mg Per Tube Daily   free water  100 mL Per Tube Q6H   [START ON 11/27/2021] furosemide  40 mg Intravenous Daily   gabapentin  600 mg Per Tube TID   guaiFENesin  15 mL Per Tube Q6H   heparin  5,000 Units Subcutaneous Q8H   methylPREDNISolone (SOLU-MEDROL) injection  20 mg Intravenous BID   multivitamin  15 mL Per Tube Daily   nicotine  21 mg Transdermal Daily   nortriptyline  50 mg Per Tube QHS   pantoprazole (PROTONIX) IV  40 mg Intravenous Q12H   revefenacin  175 mcg Nebulization Daily   sodium chloride flush  10-40 mL Intracatheter Q12H   thiamine  100 mg Per Tube Daily   Or   thiamine  100 mg Intravenous Daily   Continuous Infusions:  sodium chloride Stopped (11/25/21 0135)   piperacillin-tazobactam (ZOSYN)  IV Stopped (11/26/21 0957)     LOS: 4 days    Time spent:38 min  Georgette Shell, MD 11/26/2021, 12:51 PM

## 2021-11-26 NOTE — Progress Notes (Signed)
PT Cancellation Note  Patient Details Name: Karen Casey MRN: [id number] DOB: Jul 25, 1955   Cancelled Treatment:    Reason Eval/Treat Not Completed: Patient at procedure or test/unavailable (Pt at [facility] for EEG.) Will follow up at later date/time as schedule allows.  Verner Mould, DPT Acute Rehabilitation Services Office [phone] Pager [phone]  11/26/21 12:47 PM

## 2021-11-26 NOTE — Procedures (Addendum)
Patient Name: Karen Casey  MRN: [id number]  Epilepsy Attending: Lora Havens  Referring Physician/Provider: Brand Males, MD  Date: 11/26/2021 Duration: 22.24 mins  Patient history: 66 year old female with altered mental status.  EEG to evaluate for seizure.  Level of alertness: Awake  AEDs during EEG study: GBP, Ativan  Technical aspects: This EEG study was done with scalp electrodes positioned according to the 10-20 International system of electrode placement. Electrical activity was acquired at a sampling rate of '500Hz'$  and reviewed with a high frequency filter of '70Hz'$  and a low frequency filter of '1Hz'$ . EEG data were recorded continuously and digitally stored.   Description: The posterior dominant rhythm consists of 8 Hz activity of moderate voltage (25-35 uV) seen predominantly in posterior head regions, symmetric and reactive to eye opening and eye closing.  EEG showed intermittent generalized 5 to 7 Hz theta slowing. Hyperventilation and photic stimulation were not performed.     ABNORMALITY - Intermittent slow, generalized  IMPRESSION: This study is suggestive of mild diffuse encephalopathy, nonspecific etiology. No seizures or epileptiform discharges were seen throughout the recording.  [name]

## 2021-11-26 NOTE — Progress Notes (Signed)
NAME:  Karen Casey, MRN:  [id number], DOB:  26-Jul-1955, LOS: 4 ADMISSION DATE:  11/20/2021, CONSULTATION DATE:  11/26/21 REFERRING MD:  Dr. Erlinda Hong - [facility], CHIEF COMPLAINT:  SOB   History of Present Illness:  66 y/o F admitted to the hospital 6/9 with rib pain.  S/p fall 3 days prior to presentation, had not moved since.  EMS was called to house and patient was laying in feces.  Reportedly lying in bed for 3 days.  Creatinine relatively stable.  Chest x-ray demonstrated scattered nodular opacities.  CT scan was obtained showed scattered groundglass nodular opacities.  Placed on antibiotic for concern of pneumonia.  Patient has been nauseated with vomiting.  Reportedly preceded admission.  Certainly present last couple days.  Rapid response called 11/22/2021 AM in setting of hypoxemia, increased work of breathing.  Placed on nonrebreather at [place].  Brought to the stepdown unit.  On evaluation, mildly tachypneic but gradually improved.  Nonrebreather was able to be taken off with O2 sats in the low to mid 90s on 15 L salter.  Chest x-ray obtained showed similar findings to prior on my review interpretation.  Abdominal film concerning for dilated bowel loops, possible ileus?  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 alcohol, found in feces, reportedly on 3 days, treated for pneumonia 6/12 Transferred to ICU, on [place], worsening encephalopathy but stable on [place], started on steroids 6/13 Hallucinations overnight, tried Precedex yesterday but BP unable to tolerate, she is still confused but relatively calm this morning, remains on [place], remains on CAP coverage. Lasix '80mg'$  with 1.1L UOP, remains in positive balance. O2 weaned to 6L  Interim History / Subjective:  O2 weaned from 6L to  [employer] abd w/o evidence of ileus  BC negative  I/O 2.2L UOP, 784m stool, -2.8L in last 24 hours  Objective   Blood pressure (!) 151/100, pulse (!) 107, temperature 97.6 F (36.4 C), temperature source Axillary, resp. rate (!) 27, height '5\' 3"'$  (1.6 m), weight 75.9 kg, SpO2 95 %.    FiO2 (%):  [36 %] 36 %   Intake/Output Summary (Last 24 hours) at 11/26/2021 0807 Last data filed at 11/26/2021 0700 Gross per 24 hour  Intake 214.59 ml  Output 3150 ml  Net -2935.41 ml   Filed Weights   11/23/21 0500 11/24/21 0358 11/25/21 0500  Weight: 74.9 kg 75 kg 75.9 kg   Exam: General: ill appearing adult female lying in bed in NAD HEENT: MM pink/moist, NGT in place, anicteric  Neuro: Awake, alert to self, follows commands but otherwise not oriented, MAE / generalized weakness CV: s1s2 RRR, no m/r/g PULM: non-labored at rest, rhonchi bilaterally  GI: soft, bsx4 active  Extremities: warm/dry, trace LE edema  Skin: no rashes or lesions  Resolved Hospital Problem list     Assessment & Plan:    Acute Hypoxic Respiratory Failure likely secondary to aspiration PNA vs CAP and COPD exacerbation superimposed on baseline COPD and tobacco abuse  In the setting of nausea and vomiting with acute decompensation.  High suspicion for aspiration pneumonitis, found lying in vomit.  Consider element of pulmonary edema. Strep pneumo antigen negative. CT chest 6/12 with extensive patchy ground-glass infiltrates are seen in the both upper lobes with significant interval  worsening. Findings suggest multifocal pneumonia, possibly atypical viral pneumonia. Although RVP negative.  PE negative.  -pulmonary hygiene - IS, mobilize  -wean O2 for sats >90% -reduce lasix to QD  -continue yupelri, brovana  -zosyn, D7/7 abx  -continue solumedrol, stop time in place -follow sputum culture > shows "sent" in system but no updates as of yet, ? If specimen acceptable -PT efforts  Paroxysmal Atrial Fibrillation Chronic  HFpEF Hx of an episode one year ago during hospitalization for withdrawal and was started on Eliquis.  She followed up with Cardiology and decision made to stop anti-coagulation as back in sinus, Chads-vasc score of 1 and hx of falls.  Appears did not follow up for stress test and holter monitor.  Last EF 60-65%, EKG ST.  -continue scheduled IV lopressor for now  -tele monitoring  -reduce lasix as above   Concern for Ileus -Clamp NGT  -SLP evaluation  -begin TF until SLP evaluation, keep NGT for now for med access   History of ETOH abuse Acute Metabolic Encephalopathy CT head unremarkable. Suspect ETOH w/d plus additional component of infection. Ammonia 39 > 14.  -defer CIWA protocol to [facility] -continue home neurontin, buspar, seroquel  -thiamine, folic acid  -? baseline mental status   Best Practice (right click and "Reselect all SmartList Selections" daily)  Per primary  Critical care time:  n/a      Noe Gens, MSN, APRN, NP-C, AGACNP-BC [facility] 11/26/2021, 8:07 AM   Please see [email or web address] for pager details.   From 7A-7P if no response, please call [phone] After hours, please call ELink [phone]

## 2021-11-26 NOTE — Progress Notes (Signed)
EEG complete - results pending 

## 2021-11-27 ENCOUNTER — Inpatient Hospital Stay (HOSPITAL_COMMUNITY): Payer: Medicare Other

## 2021-11-27 DIAGNOSIS — J9601 Acute respiratory failure with hypoxia: Secondary | ICD-10-CM

## 2021-11-27 LAB — PHOSPHORUS
Phosphorus: 2.2 mg/dL — ABNORMAL LOW (ref 2.5–4.6)
Phosphorus: 2.3 mg/dL — ABNORMAL LOW (ref 2.5–4.6)

## 2021-11-27 LAB — GLUCOSE, CAPILLARY
Glucose-Capillary: 123 mg/dL — ABNORMAL HIGH (ref 70–99)
Glucose-Capillary: 124 mg/dL — ABNORMAL HIGH (ref 70–99)
Glucose-Capillary: 133 mg/dL — ABNORMAL HIGH (ref 70–99)
Glucose-Capillary: 139 mg/dL — ABNORMAL HIGH (ref 70–99)
Glucose-Capillary: 139 mg/dL — ABNORMAL HIGH (ref 70–99)
Glucose-Capillary: 155 mg/dL — ABNORMAL HIGH (ref 70–99)

## 2021-11-27 LAB — CBC
HCT: 33.8 % — ABNORMAL LOW (ref 36.0–46.0)
Hemoglobin: 11.1 g/dL — ABNORMAL LOW (ref 12.0–15.0)
MCH: 33.2 pg (ref 26.0–34.0)
MCHC: 32.8 g/dL (ref 30.0–36.0)
MCV: 101.2 fL — ABNORMAL HIGH (ref 80.0–100.0)
Platelets: 144 10*3/uL — ABNORMAL LOW (ref 150–400)
RBC: 3.34 MIL/uL — ABNORMAL LOW (ref 3.87–5.11)
RDW: 13.6 % (ref 11.5–15.5)
WBC: 9.2 10*3/uL (ref 4.0–10.5)
nRBC: 0 % (ref 0.0–0.2)

## 2021-11-27 LAB — BASIC METABOLIC PANEL
Anion gap: 9 (ref 5–15)
Anion gap: 5 (ref 5–15)
BUN: 8 mg/dL (ref 8–23)
BUN: 9 mg/dL (ref 8–23)
CO2: 35 mmol/L — ABNORMAL HIGH (ref 22–32)
CO2: 35 mmol/L — ABNORMAL HIGH (ref 22–32)
Calcium: 7.8 mg/dL — ABNORMAL LOW (ref 8.9–10.3)
Calcium: 8.4 mg/dL — ABNORMAL LOW (ref 8.9–10.3)
Chloride: 97 mmol/L — ABNORMAL LOW (ref 98–111)
Chloride: 101 mmol/L (ref 98–111)
Creatinine, Ser: 0.44 mg/dL (ref 0.44–1.00)
Creatinine, Ser: 0.55 mg/dL (ref 0.44–1.00)
GFR, Estimated: >60 mL/min (ref >60)
GFR, Estimated: >60 mL/min (ref >60)
Glucose, Bld: 107 mg/dL — ABNORMAL HIGH (ref 70–99)
Glucose, Bld: 136 mg/dL — ABNORMAL HIGH (ref 70–99)
Potassium: 2.7 mmol/L — CL (ref 3.5–5.1)
Potassium: 4 mmol/L (ref 3.5–5.1)
Sodium: 141 mmol/L (ref 135–145)
Sodium: 141 mmol/L (ref 135–145)

## 2021-11-27 LAB — MAGNESIUM
Magnesium: 1.3 mg/dL — ABNORMAL LOW (ref 1.7–2.4)
Magnesium: 2.2 mg/dL (ref 1.7–2.4)

## 2021-11-27 LAB — VITAMIN B12: Vitamin B-12: 1248 pg/mL — ABNORMAL HIGH (ref 180–914)

## 2021-11-27 LAB — T4, FREE: Free T4: 0.83 ng/dL (ref 0.61–1.12)

## 2021-11-27 LAB — TSH: TSH: 6.711 u[IU]/mL — ABNORMAL HIGH (ref 0.350–4.500)

## 2021-11-27 MED ORDER — DEXTROSE 5 % IV SOLN
30.0000 mmol | Freq: Once | INTRAVENOUS | Status: AC
  Administered 2021-11-27: 30 mmol via INTRAVENOUS
  Filled 2021-11-27: qty 10

## 2021-11-27 MED ORDER — FUROSEMIDE 10 MG/ML IJ SOLN
40.0000 mg | INTRAMUSCULAR | Status: AC
  Administered 2021-11-27: 40 mg via INTRAVENOUS
  Filled 2021-11-27: qty 4

## 2021-11-27 MED ORDER — THIAMINE HCL 100 MG/ML IJ SOLN
500.0000 mg | INTRAVENOUS | Status: DC
  Administered 2021-11-27 – 2021-12-01 (×5): 500 mg via INTRAVENOUS
  Filled 2021-11-27 (×5): qty 5

## 2021-11-27 MED ORDER — POTASSIUM CHLORIDE 20 MEQ PO PACK
40.0000 meq | PACK | Freq: Every day | ORAL | Status: DC
  Administered 2021-11-27: 40 meq
  Filled 2021-11-27 (×2): qty 2

## 2021-11-27 MED ORDER — POTASSIUM CHLORIDE 20 MEQ PO PACK
40.0000 meq | PACK | Freq: Every day | ORAL | Status: DC
Start: 2021-11-27 — End: 2021-11-29
  Administered 2021-11-27 – 2021-11-28 (×2): 40 meq
  Filled 2021-11-27 (×2): qty 2

## 2021-11-27 MED ORDER — OSMOLITE 1.5 CAL PO LIQD
1000.0000 mL | ORAL | Status: DC
Start: 2021-11-27 — End: 2021-11-29
  Administered 2021-11-27: 1000 mL
  Filled 2021-11-27 (×2): qty 1000

## 2021-11-27 MED ORDER — TRAMADOL HCL 50 MG PO TABS
50.0000 mg | ORAL_TABLET | Freq: Four times a day (QID) | ORAL | Status: DC | PRN
  Administered 2021-11-27 – 2021-11-29 (×3): 50 mg
  Filled 2021-11-27 (×3): qty 1

## 2021-11-27 MED ORDER — MAGNESIUM SULFATE 4 GM/100ML IV SOLN
4.0000 g | Freq: Once | INTRAVENOUS | Status: AC
  Administered 2021-11-27: 4 g via INTRAVENOUS
  Filled 2021-11-27: qty 100

## 2021-11-27 NOTE — Progress Notes (Signed)
NUTRITION NOTE  Started on trickle rate TF regimen of Osmolite 1.5 @ 20 ml/hr with 45 ml Prosource TF BID yesterday. No dextrose containing IV fluids infusing at this time.  Labs reviewed; K: 2.7 mmol/l, Phos: 2.2 mg/dl, and Mg: 1.3 mg/dl.  Will increase Osmolite 1.5 to 30 ml/hr with 45 ml Prosource TF BID this evening once K, Mg, and Phos have been repleted.     Jarome Matin, MS, RD, LDN, [place] Registered Dietitian II Inpatient Clinical Nutrition RD pager # and on-call/weekend pager # available in [facility]

## 2021-11-27 NOTE — Progress Notes (Signed)
Pt was sitting in a chair both times RT went to do CPT.

## 2021-11-27 NOTE — Progress Notes (Signed)
[place] Progress Note Patient Name: Karen Casey DOB: 1955/08/11 MRN: [id number]   Date of Service  11/27/2021  HPI/Events of Note  Patient with sinus tachycardia with PAC's and occasional PVC's, Mg+ at 1700 yesterday was 1.4 and Phos was 1.8. Replacement was not ordered. AM labs are pending.  eICU Interventions  Phos and magnesium replaced per E-Link electrolyte replacement protocol.        Kerry Kass Devin Foskey 11/27/2021, 2:08 AM

## 2021-11-27 NOTE — Progress Notes (Signed)
PROGRESS NOTE    Karen Casey  ZOX:[id number] DOB: [birth year] DOA: 11/20/2021 PCP: [name], MD   Brief Narrative: 66 year old female with multiple previous chronic.  Prescription for benzodiazepine abuse. On the day of admission EMS was called who found her laying on the floor in vomitus unable to get up.  3 days prior to this patient had a fall. CT chest concerning for groundglass nodular opacities patient placed on antibiotics. On 11/22/2021 rapid response was called due to hypoxemia and increased work of breathing and increased oxygen demand to 15 L patient was transferred to stepdown unit. CT angiogram of the chest did not show pulmonary embolism but showed extensive patchy groundglass infiltrates in both upper lobes suggestive of multifocal pneumonia. Abdominal x-ray with dilated small bowel loops of severe diffuse adynamic ileus. 11/25/2021-she had altered mental status overnight.  Patient was on Ativan oxycodone and Seroquel and Precedex.  Seroquel has been stopped.  Potassium was 5.3 potassium replacement was stopped, patient is on Lasix every 8.  She had watery loose stools and Flexi-Seal was placed. Blood pressure remains soft hypotensive 85/53 she was started on phenylephrine IV infusion through the peripheral IV and IV fluid bolus was given. NG tube was placed for ileus. CT head showed no acute intracranial pathology.  Bifrontal atrophy and chronic microvascular ischemic changes. Ammonia level was normal. Echo with EF 60 to 65% no regional wall motion abnormalities.  Grade 1 diastolic dysfunction. Assessment & Plan:   Principal Problem:   Right rib fracture Active Problems:   Elevated LFTs   Tobacco abuse   Alcohol use   Paroxysmal atrial fibrillation (HCC)   Chronic obstructive pulmonary disease, group B, by Global Initiative for Chronic Obstructive Lung Disease 2017 classification ([place])   Hypokalemia   Community acquired pneumonia   Aspiration pneumonia ([place])    Acute respiratory failure with hypoxia ([place])   Encephalopathy acute  #1 acute hypoxic respiratory failure secondary to multifocal pneumonia likely from aspiration versus CAP Was on Rocephin and azithromycin Antibiotics switched to Zosyn due to persistent worsening of bilateral infiltrates and increasing oxygen demand O2 requirement was up to 15 L Continue steroids breathing treatments Lasix antibiotics Leukocytosis has been worsening since admission. White count 24 from 16. Check blood cultures and UA. MRSA PCR negative. I discussed with her significant other abdominal patient and him has been living together for 30+ years and has a daughter who is 23 years old.  He says her baseline is normally she is mentally clear and is able to talk she does not walk much due to neuropathy she watches TV sitting up in her chair or sofa that is about it. On lasix 40 mg daily add kdur  #2 adynamic ileus resolved NG tube in place for feeding and for medications.    #3 acute metabolic encephalopathy secondary to #1  #4 chronic alcohol abuse on CIWA protocol Trying high dose thiamine   #5 paroxysmal atrial fibrillation not on any anticoagulation due to heavy alcohol abuse and risk of fall.  #6 vertebral compression fractures supportive treatment  #7 n.p.o. status NG tube in place for feeds and medications  #8 multiple electrolyte abnormalities hypokalemia, hypomagnesemia, hypophosphatemia replete.   Add potassium daily with Lasix on board. Estimated body mass index is 29.64 kg/m as calculated from the following:   Height as of this encounter: '5\' 3"'$  (1.6 m).   Weight as of this encounter: 75.9 kg.  DVT prophylaxis: Heparin  code Status: Full code  family Communication: None  at bedside  disposition Plan:  Status is: Inpatient Remains inpatient appropriate because: NG tube IV antibiotics still hypoxic   Consultants:  PCCM  Procedures: NG tube placed 11/25/2021 Antimicrobials:  Zosyn  Subjective:  Received oxycodone at 2 AM she is more sleepier than yesterday Receiving neb treatments Rectal tube with loose BM Tolerating tube feeds  Objective: Vitals:   11/27/21 0600 11/27/21 0700 11/27/21 0730 11/27/21 0754  BP: 115/69 119/72    Pulse: (!) 110 (!) 110 (!) 109   Resp: '18 16 18   '$ Temp:    98.5 F (36.9 C)  TempSrc:    Oral  SpO2: 98% 97% 97% 98%  Weight:      Height:        Intake/Output Summary (Last 24 hours) at 11/27/2021 1042 Last data filed at 11/27/2021 1010 Gross per 24 hour  Intake 1035.17 ml  Output 800 ml  Net 235.17 ml    Filed Weights   11/23/21 0500 11/24/21 0358 11/25/21 0500  Weight: 74.9 kg 75 kg 75.9 kg    Examination: Had oxycodone around 2 AM overnight for restlessness and tachycardia, tachycardia improved but she became very sedated  She opened her eyes looked at me while I was in the room but did not follow any commands or answer any questions NG tube in place Rectal tube with liquid stool  General exam: Appears chronically ill-appearing  respiratory system: Rhonchi to auscultation. Respiratory effort normal. Cardiovascular system: S1 & S2 heard, RRR. No JVD, murmurs, rubs, gallops or clicks. No pedal edema. Gastrointestinal system: Abdomen is nondistended, soft and nontender. No organomegaly or masses felt. Normal bowel sounds heard. Central nervous system: Alert and oriented. No focal neurological deficits. Extremities: Trace edema  skin: No rashes, lesions or ulcers Psychiatry: unable to assess  Data Reviewed: I have personally reviewed following labs and imaging studies  CBC: Recent Labs  Lab 11/21/21 0410 11/22/21 0321 11/23/21 0303 11/24/21 0240 11/25/21 0312 11/26/21 0333 11/27/21 0157  WBC 12.0*   < > 19.2* 18.3* 24.5* 9.8 9.2  NEUTROABS 9.5*  --  15.9* 16.8* 21.6* 8.0*  --   HGB 12.7   < > 13.2 12.9 12.3 11.6* 11.1*  HCT 36.6   < > 40.3 39.3 38.4 35.7* 33.8*  MCV 98.9   < > 103.1* 105.1* 104.6*  103.5* 101.2*  PLT 170   < > 189 157 248 145* 144*   < > = values in this interval not displayed.    Basic Metabolic Panel: Recent Labs  Lab 11/23/21 1501 11/24/21 0240 11/25/21 0312 11/25/21 0552 11/25/21 1139 11/26/21 0333 11/26/21 1700 11/27/21 0157  NA 136 136 139  --  139 143  --  141  K 4.2 4.4 5.3*  --  5.0 3.5  --  2.7*  CL 100 103 104  --  105 99  --  97*  CO2 '27 26 27  '$ --  27 31  --  35*  GLUCOSE 98 107* 98  --  97 85  --  107*  BUN 14 16 QUANTITY NOT SUFFICIENT, UNABLE TO PERFORM TEST '15 13 10  '$ --  8  CREATININE 0.88 0.80 QUANTITY NOT SUFFICIENT, UNABLE TO PERFORM TEST 0.69 0.53 0.58  --  0.44  CALCIUM 7.8* 7.7* 7.9*  --  7.5* 7.9*  --  7.8*  MG 2.2 2.3 1.9  --   --   --  1.4* 1.3*  PHOS  --  4.3  --   --   --   --  1.8* 2.2*    GFR: Estimated Creatinine Clearance: 70.2 mL/min (by C-G formula based on SCr of 0.44 mg/dL). Liver Function Tests: Recent Labs  Lab 11/20/21 2141 11/21/21 0410 11/22/21 0321 11/23/21 0303 11/25/21 0312 11/25/21 0552  AST 393* 266* 100* 41 QUANTITY NOT SUFFICIENT, UNABLE TO PERFORM TEST 31  ALT 144* 110* 84* 54* 37  --   ALKPHOS 155* 131* 153* 130* 132*  --   BILITOT 1.1 0.8 0.9 0.6 1.0  --   PROT 5.0* 4.5* 4.7* 4.5* 5.2*  --   ALBUMIN 2.1* 1.9* 1.9* 1.7* 1.9*  --     Recent Labs  Lab 11/22/21 0958  LIPASE 20    Recent Labs  Lab 11/23/21 0303 11/25/21 0040  AMMONIA 39* 14    Coagulation Profile: Recent Labs  Lab 11/20/21 2141  INR 1.3*    Cardiac Enzymes: Recent Labs  Lab 11/20/21 2141  CKTOTAL 47    BNP (last 3 results) No results for input(s): "PROBNP" in the last 8760 hours. HbA1C: No results for input(s): "HGBA1C" in the last 72 hours. CBG: Recent Labs  Lab 11/26/21 1553 11/26/21 1946 11/26/21 2341 11/27/21 0341 11/27/21 0820  GLUCAP 107* 94 109* 124* 139*    Lipid Profile: No results for input(s): "CHOL", "HDL", "LDLCALC", "TRIG", "CHOLHDL", "LDLDIRECT" in the last 72 hours. Thyroid  Function Tests: No results for input(s): "TSH", "T4TOTAL", "FREET4", "T3FREE", "THYROIDAB" in the last 72 hours. Anemia Panel: No results for input(s): "VITAMINB12", "FOLATE", "FERRITIN", "TIBC", "IRON", "RETICCTPCT" in the last 72 hours. Sepsis Labs: Recent Labs  Lab 11/21/21 0410 11/22/21 1003 11/22/21 1649 11/23/21 0642 11/23/21 0915  PROCALCITON 10.80  --   --   --   --   LATICACIDVEN  --  2.8* 2.8* 1.7 1.4     Recent Results (from the past 240 hour(s))  Culture, blood (Routine x 2)     Status: None   Collection Time: 11/20/21  8:58 PM   Specimen: BLOOD  Result Value Ref Range Status   Specimen Description   Final    BLOOD BLOOD RIGHT HAND Performed at [facility], [place] [address], [place], [place] [address]    Special Requests   Final    BOTTLES DRAWN AEROBIC AND ANAEROBIC Blood Culture results may not be optimal due to an inadequate volume of blood received in culture bottles Performed at [place] [address] [place], [place] [address]    Culture   Final    NO GROWTH 5 DAYS Performed at [facility], [place] [address], [place], [place] [address]    Report Status 11/26/2021 FINAL  Final  Culture, blood (Routine x 2)     Status: None   Collection Time: 11/20/21  9:03 PM   Specimen: BLOOD  Result Value Ref Range Status   Specimen Description   Final    BLOOD BLOOD LEFT HAND Performed at [place] [address] [place], [place] [address]    Special Requests   Final    BOTTLES DRAWN AEROBIC AND ANAEROBIC Blood Culture adequate volume Performed at [place] [address], [place], [place] [address]    Culture   Final    NO GROWTH 5 DAYS Performed at [facility], [place] [address], [place],  [address]    Report Status 11/26/2021 FINAL  Final  Urine Culture     Status: Abnormal   Collection Time: 11/21/21  8:48 AM   Specimen:  Urine, Clean Catch  Result  Value Ref Range Status   Specimen Description   Final    URINE, CLEAN CATCH Performed at [facility], [place] [address], [place], [place] [address]    Special Requests   Final    NONE Performed at [facility], [place] [address], [place], [place] [address]    Culture MULTIPLE SPECIES PRESENT, SUGGEST RECOLLECTION (A)  Final   Report Status 11/22/2021 FINAL  Final  MRSA Next Gen by PCR, Nasal     Status: None   Collection Time: 11/22/21  9:38 AM   Specimen: Nasal Mucosa; Nasal Swab  Result Value Ref Range Status   MRSA by PCR Next Gen NOT DETECTED NOT DETECTED Final    Comment: (NOTE) The GeneXpert MRSA Assay (FDA approved for NASAL specimens only), is one component of a comprehensive MRSA colonization surveillance program. It is not intended to diagnose MRSA infection nor to guide or monitor treatment for MRSA infections. Test performance is not FDA approved in patients less than 60 years old. Performed at [facility], [place] [address], [place], [place] [address]   Respiratory (~20 pathogens) panel by PCR     Status: None   Collection Time: 11/23/21  9:16 AM   Specimen: Nasopharyngeal Swab; Respiratory  Result Value Ref Range Status   Adenovirus NOT DETECTED NOT DETECTED Final   Coronavirus 229E NOT DETECTED NOT DETECTED Final    Comment: (NOTE) The Coronavirus on the Respiratory Panel, DOES NOT test for the novel  Coronavirus (2019 nCoV)    Coronavirus HKU1 NOT DETECTED NOT DETECTED Final   Coronavirus NL63 NOT DETECTED NOT DETECTED Final   Coronavirus OC43 NOT DETECTED NOT DETECTED Final   Metapneumovirus NOT DETECTED NOT DETECTED Final   Rhinovirus / Enterovirus NOT DETECTED NOT DETECTED Final   Influenza A NOT DETECTED NOT DETECTED Final   Influenza B NOT DETECTED NOT DETECTED Final   Parainfluenza Virus 1 NOT DETECTED NOT DETECTED Final   Parainfluenza Virus 2 NOT DETECTED NOT DETECTED Final   Parainfluenza Virus 3  NOT DETECTED NOT DETECTED Final   Parainfluenza Virus 4 NOT DETECTED NOT DETECTED Final   Respiratory Syncytial Virus NOT DETECTED NOT DETECTED Final   Bordetella pertussis NOT DETECTED NOT DETECTED Final   Bordetella Parapertussis NOT DETECTED NOT DETECTED Final   Chlamydophila pneumoniae NOT DETECTED NOT DETECTED Final   Mycoplasma pneumoniae NOT DETECTED NOT DETECTED Final    Comment: Performed at [facility], [place]. [address], [place],  [address]         Radiology Studies: DG CHEST PORT 1 VIEW  Result Date: 11/27/2021 CLINICAL DATA:  Respiratory failure. EXAM: PORTABLE CHEST 1 VIEW COMPARISON:  11/24/2021. FINDINGS: 4:32 a.m., 11/27/2021. Enteric tube is well inside the stomach but tip projected in the distal body of stomach with right PICC tip at the superior cavoatrial junction. The patient is rotated to the left today. There is a low inspiration. Interstitial and widespread patchy opacities of the lung fields continue to be seen. There is slight improvement in aeration of the right base but otherwise no changes in the overall aeration and small right pleural effusion. The mediastinum is normally outlined.  No acute osseous findings. IMPRESSION: Slightly improved right basilar aeration. No other interval change in the bilateral lung opacities. Electronically Signed   By: Telford Nab M.D.   On: 11/27/2021 07:29   EEG adult  Result Date: 11/26/2021 Lora Havens, MD  11/26/2021 12:23 PM Patient Name: Karen Casey MRN: [id number] Epilepsy Attending: Lora Havens Referring Physician/Provider: Brand Males, MD Date: 11/26/2021 Duration: 22.24 mins Patient history: 66 year old female with altered mental status.  EEG to evaluate for seizure. Level of alertness: Awake AEDs during EEG study: GBP, Ativan Technical aspects: This EEG study was done with scalp electrodes positioned according to the 10-20 International system of electrode placement. Electrical activity  was acquired at a sampling rate of '500Hz'$  and reviewed with a high frequency filter of '70Hz'$  and a low frequency filter of '1Hz'$ . EEG data were recorded continuously and digitally stored. Description: The posterior dominant rhythm consists of 8 Hz activity of moderate voltage (25-35 uV) seen predominantly in posterior head regions, symmetric and reactive to eye opening and eye closing.  EEG showed intermittent generalized 5 to 7 Hz theta slowing. Hyperventilation and photic stimulation were not performed.   ABNORMALITY - Intermittent slow, generalized IMPRESSION: This study is suggestive of mild diffuse encephalopathy, nonspecific etiology. No seizures or epileptiform discharges were seen throughout the recording. Priyanka O Yadav   [place] EKG SITE RITE  Result Date: 11/26/2021 If Site Rite image not attached, placement could not be confirmed due to current cardiac rhythm.  DG Abd 1 View  Result Date: 11/26/2021 CLINICAL DATA:  Nausea and vomiting. EXAM: ABDOMEN - 1 VIEW COMPARISON:  Study from yesterday at 9:34 p.m. FINDINGS: 4:30 a.m. 11/26/2021. NGT tip is in the body of stomach as before. There is a general paucity of bowel aeration. Scattered bowel aeration in the lower abdomen and pelvis is noted. There is no supine evidence of free air. No pathologic calcification. Visceral shadows are stable. IMPRESSION: 1. NGT tip in the body of stomach, unchanged. 2. General paucity of bowel aeration particularly in the abdomen. Electronically Signed   By: Telford Nab M.D.   On: 11/26/2021 07:06   DG Abd 1 View  Result Date: 11/26/2021 CLINICAL DATA:  Check NG placement. EXAM: ABDOMEN - 1 VIEW COMPARISON:  Earlier study today at 4:30 a.m. FINDINGS: 5:48 a.m., 11/26/2021. The lower half of the true pelvis and portion of the lateral right hemiabdomen are excluded from the exam. NGT is within the stomach. The proximal side-hole is 5.3 cm below the level of the hiatus and the tip is in the body of the stomach. There is  a general paucity of bowel gas. Visceral shadows are stable. There is no supine evidence of free air. Osteopenia and degenerative change dorsal spine. IMPRESSION: 1. NGT appears adequately inserted. 2. General paucity of bowel gas. Minimal small bowel aeration in right lower quadrant is noted. Electronically Signed   By: Telford Nab M.D.   On: 11/26/2021 06:26   DG Abd 1 View  Result Date: 11/25/2021 CLINICAL DATA:  NG placement. EXAM: ABDOMEN - 1 VIEW COMPARISON:  Earlier radiograph dated 11/25/2021. FINDINGS: Enteric tube with tip in the body of the stomach. No bowel dilatation. Scattered reticulonodular densities noted at the visualized lung bases. Clinical correlation is recommended. IMPRESSION: 1. Enteric tube with tip in the body of the stomach. 2. Scattered reticulonodular densities at the lung bases. Electronically Signed   By: Anner Crete M.D.   On: 11/25/2021 22:17   DG Abd Portable 1V  Result Date: 11/25/2021 CLINICAL DATA:  Follow-up ileus in a 66 year old female. EXAM: PORTABLE ABDOMEN - 1 VIEW COMPARISON:  November 24, 2021. FINDINGS: Gastric tube remains in place, tip in the mid stomach. Rectal management tube in place, balloon inflated to approximately 6.5 cm  projecting over the lower pelvis. Diminished diffuse gaseous distension of bowel loops. No signs of obstruction. Little if any dilated loops of small bowel are noted compatible with improving/resolving ileus. Potential RIGHT intrarenal calculus up to 4 mm. On limited assessment there is no acute skeletal process. IMPRESSION: 1. Improving/resolving ileus. 2. Rectal management tube with balloon inflated to 6.5 cm in the pelvis. Correlate with any pelvic discomfort with adjustment as needed. 3. Small RIGHT intrarenal calculus is suspected. These results will be called to the ordering clinician or representative by the Radiologist Assistant, and communication documented in the PACS or [employer]. Electronically Signed   By:  Zetta Bills M.D.   On: 11/25/2021 10:53        Scheduled Meds:  arformoterol  15 mcg Nebulization BID   busPIRone  10 mg Per Tube TID   Chlorhexidine Gluconate Cloth  6 each Topical Daily   feeding supplement (OSMOLITE 1.5 CAL)  1,000 mL Per Tube Q24H   feeding supplement (PROSource TF)  45 mL Per Tube BID   folic acid  1 mg Per Tube Daily   free water  100 mL Per Tube Q6H   furosemide  40 mg Intravenous Daily   gabapentin  600 mg Per Tube TID   guaiFENesin  15 mL Per Tube Q6H   heparin  5,000 Units Subcutaneous Q8H   multivitamin  15 mL Per Tube Daily   nicotine  21 mg Transdermal Daily   nortriptyline  50 mg Per Tube QHS   pantoprazole (PROTONIX) IV  40 mg Intravenous Q12H   revefenacin  175 mcg Nebulization Daily   sodium chloride flush  10-40 mL Intracatheter Q12H   thiamine  100 mg Per Tube Daily   Or   thiamine  100 mg Intravenous Daily   Continuous Infusions:  sodium chloride Stopped (11/25/21 0135)     LOS: 5 days    Time spent:38 min  Georgette Shell, MD 11/27/2021, 10:42 AM

## 2021-11-27 NOTE — Progress Notes (Signed)
[place] Progress Note Patient Name: Karen Casey DOB: 1955-11-20 MRN: [id number]   Date of Service  11/27/2021  HPI/Events of Note  Follow up BMP and Mg++ check indicated.  eICU Interventions  BMP and Mg++ ordered.        Frederik Pear 11/27/2021, 8:33 PM

## 2021-11-27 NOTE — Progress Notes (Addendum)
NAME:  Karen Casey, MRN:  [id number], DOB:  10-20-1955, LOS: 5 ADMISSION DATE:  11/20/2021, CONSULTATION DATE:  11/27/21 REFERRING MD:  Dr. Erlinda Hong - [facility], CHIEF COMPLAINT:  SOB   History of Present Illness:  66 y/o F admitted to the hospital 6/9 with rib pain.  S/p fall 3 days prior to presentation, had not moved since.  EMS was called to house and patient was laying in feces.  Reportedly lying in bed for 3 days.  Creatinine relatively stable.  Chest x-ray demonstrated scattered nodular opacities.  CT scan was obtained showed scattered groundglass nodular opacities.  Placed on antibiotic for concern of pneumonia.  Patient has been nauseated with vomiting.  Reportedly preceded admission.  Certainly present last couple days.  Rapid response called 11/22/2021 AM in setting of hypoxemia, increased work of breathing.  Placed on nonrebreather at [place].  Brought to the stepdown unit.  On evaluation, mildly tachypneic but gradually improved.  Nonrebreather was able to be taken off with O2 sats in the low to mid 90s on 15 L salter.  Chest x-ray obtained showed similar findings to prior on my review interpretation.  Abdominal film concerning for dilated bowel loops, possible ileus?  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 alcohol, found in feces, reportedly on 3 days, treated for pneumonia 6/12 Transferred to ICU, on [place], worsening encephalopathy but stable on [place], started on steroids 6/13 Hallucinations overnight, tried Precedex yesterday but BP unable to tolerate, she is still confused but relatively calm this morning, remains on [place], remains on CAP coverage. Lasix '80mg'$  with 1.1L UOP, remains in positive balance. O2 weaned to 6L  Interim History / Subjective:  RN reports pt sedate,  received oxycodone for tachycardia overnight with concern for pain  O2 on 3L  Tolerating feeding K/Mg replaced per [facility] EEG 6/15 with mild diffuse encephalopathy, no seizures/epileptiform discharges  Objective   Blood pressure 119/72, pulse (!) 109, temperature 98.5 F (36.9 C), temperature source Oral, resp. rate 18, height '5\' 3"'$  (1.6 m), weight 75.9 kg, SpO2 98 %.        Intake/Output Summary (Last 24 hours) at 11/27/2021 1027 Last data filed at 11/27/2021 1010 Gross per 24 hour  Intake 1035.17 ml  Output 800 ml  Net 235.17 ml   Filed Weights   11/23/21 0500 11/24/21 0358 11/25/21 0500  Weight: 74.9 kg 75 kg 75.9 kg   Exam: General: chronically ill appearing adult female lying in bed in NAD  HEENT: MM pink/dry, NGT in place, anicteric, pupils 60m reactive  Neuro: sedate, arouses and moans, tries to say good morning  CV: s1s2 RRR, no m/r/g PULM: non-labored at rest, lungs bilaterally diminished  GI: soft, bsx4 active  Extremities: warm/dry, trace dependent edema  Skin: no rashes or lesions  Resolved Hospital Problem list     Assessment & Plan:    Acute Hypoxic Respiratory Failure likely secondary to aspiration PNA vs CAP and COPD exacerbation superimposed on baseline COPD and tobacco abuse  In the setting of nausea and vomiting with acute decompensation.  High suspicion for aspiration pneumonitis, found lying in vomit.  Consider element of pulmonary edema. Strep pneumo antigen negative. CT chest 6/12 with extensive patchy ground-glass infiltrates are seen in the both upper lobes with significant  interval worsening. Findings suggest multifocal pneumonia, possibly atypical viral pneumonia. Although RVP negative.  PE negative. Completed abx 6/16 -brovana + yupelri  -pulmonary hygiene -IS, mobilize -PT efforts > OOB to chair  -wean O2 for sats >90% -completed solumedrol  -lasix QD  -appears sputum culture not active / states "sent" but no further info in chart -CXR images  reviewed 6/16 > mild right basilar atelectasis   Paroxysmal Atrial Fibrillation Chronic HFpEF Hx of an episode one year ago during hospitalization for withdrawal and was started on Eliquis.  She followed up with Cardiology and decision made to stop anti-coagulation as back in sinus, Chads-vasc score of 1 and hx of falls.  Appears did not follow up for stress test and holter monitor.  Last EF 60-65%, EKG ST.  -tele monitoring > appears to be in NSR  -lasix as above  Concern for Ileus -begin TF  -monitor electrolytes closely, at risk refeeding syndrome -appreciate SLP   History of ETOH abuse Acute Metabolic Encephalopathy CT head unremarkable. Suspect ETOH w/d plus additional component of infection. Ammonia 39 > 14.  -continue home neurontin, buspar, seroquel -thiamine, folic acid  -stop oxycodone given profound sedating effect  -per sister, baseline patient is alert and talks about her family. May not be completely up to date on current events   Hypokalemia Hypomagnesemia  Hypophosphatemia  -replaced, follow electrolytes closely   Best Practice (right click and "Reselect all SmartList Selections" daily)  Per primary  Critical care time:  n/a      Noe Gens, MSN, APRN, NP-C, AGACNP-BC [facility] 11/27/2021, 10:27 AM   Please see [email or web address] for pager details.   From 7A-7P if no response, please call [phone] After hours, please call ELink [phone]

## 2021-11-27 NOTE — Progress Notes (Signed)
SLP Cancellation Note  Patient Details Name: Karen Casey MRN: [id number] DOB: Karen Casey, Karen Casey   Cancelled treatment:       Reason Eval/Treat Not Completed: Other (comment);Fatigue/lethargy limiting ability to participate (per RN, pt finally asleep and hasn't slept for days, requested she message SLP when pt wakes)   Macario Golds 11/27/2021, 8:16 AM  Kathleen Lime, MS [place] Office [phone] Pager [phone]

## 2021-11-27 NOTE — Progress Notes (Signed)
RN requested to hold 0400 CPT, PT is resting comfortably for the first time in several hours.

## 2021-11-27 NOTE — Progress Notes (Signed)
Physical Therapy Treatment Patient Details Name: Karen Casey MRN: [id number] DOB: [birth year] Today's Date: 11/27/2021   History of Present Illness Pt admitted from home with N/V/D and recent fall with minimally displaced R tenth rib.  Pt with hx of COPD, peripheral neuropathy, seizure and poly-substance abuse.Transfer to ICU 11/22/21 for respiratory distress.    PT Comments    Patient lethargic with periods of arousal during PROM?AAROM>  Patient did arouse to indicate right ankle pain, noted swelling and bruising on lateral ankle/hee'l . Patient on 3 L [place] with noted drop from 93%, HR100 to 84%, RR incr to 40, HR 115.  Continue PT as patient able  to tolerate/participate.  Recommendations for follow up therapy are one component of a multi-disciplinary discharge planning process, led by the attending physician.  Recommendations may be updated based on patient status, additional functional criteria and insurance authorization.  Follow Up Recommendations  Skilled nursing-short term rehab (<3 hours/day)     Assistance Recommended at Discharge Frequent or constant Supervision/Assistance  Patient can return home with the following Two people to help with walking and/or transfers;A lot of help with bathing/dressing/bathroom;Assistance with cooking/housework;Assist for transportation;Help with stairs or ramp for entrance   Equipment Recommendations  None recommended by PT    Recommendations for Other Services       Precautions / Restrictions Precautions Precautions: Fall Precaution Comments: monitor sats/HR, NG feeding tube, flexiseal     Mobility  Bed Mobility               General bed mobility comments: in recliner via lift by nursing, unable to work on mobility, patient lethargic and not following commands with intermittent arousal when stimulated.    Transfers                        Ambulation/Gait                   Stairs              Wheelchair Mobility    Modified Rankin (Stroke Patients Only)       Balance                                            Cognition Arousal/Alertness: Lethargic Behavior During Therapy: Flat affect Overall Cognitive Status: No family/caregiver present to determine baseline cognitive functioning                                 General Comments: patient mostly lethargic except to state that her right ankle was painful. Noted right lateral edema, inferior to lateral malleolus.        Exercises General Exercises - Upper Extremity Shoulder Flexion: PROM, Both, 10 reps Shoulder ABduction: PROM, Both, 10 reps Shoulder Horizontal ABduction: PROM, Both, 10 reps Shoulder Horizontal ADduction: PROM, 10 reps, Both Elbow Flexion: PROM Elbow Extension: PROM, Both, 10 reps General Exercises - Lower Extremity Ankle Circles/Pumps: PROM, Both, 10 reps, AAROM Short Arc Quad: PROM, Both, 10 reps Heel Slides: PROM, 10 reps, Both    General Comments        Pertinent Vitals/Pain Pain Assessment Breathing: occasional labored breathing, short period of hyperventilation Negative Vocalization: occasional moan/groan, low speech, negative/disapproving quality Facial Expression: facial grimacing Body Language: relaxed Consolability: no need to console PAINAD  Score: 4 Pain Location: right lateal ankle- Asked patient if she was in pain."my right ankle" slurred. Pain Descriptors / Indicators: Grimacing Pain Intervention(s): Monitored during session, Repositioned    Home Living                          Prior Function            PT Goals (current goals can now be found in the care plan section) Progress towards PT goals: Not progressing toward goals - comment (worseneing medical)    Frequency    Min 2X/week      PT Plan Current plan remains appropriate    Co-evaluation              AM-PAC PT "6 Clicks" Mobility   Outcome  Measure  Help needed turning from your back to your side while in a flat bed without using bedrails?: Total Help needed moving from lying on your back to sitting on the side of a flat bed without using bedrails?: Total Help needed moving to and from a bed to a chair (including a wheelchair)?: Total Help needed standing up from a chair using your arms (e.g., wheelchair or bedside chair)?: Total Help needed to walk in hospital room?: Total Help needed climbing 3-5 steps with a railing? : Total 6 Click Score: 6    End of Session   Activity Tolerance: Patient limited by lethargy Patient left: in chair;with call bell/phone within reach Nurse Communication: Mobility status PT Visit Diagnosis: Muscle weakness (generalized) (M62.81);Difficulty in walking, not elsewhere classified (R26.2);History of falling (Z91.81)     Time: 5397-6734 PT Time Calculation (min) (ACUTE ONLY): 10 min  Charges:  $Therapeutic Exercise: 8-22 mins                     Tresa Endo PT Acute Rehabilitation Services Pager [phone] Office [phone]    Claretha Cooper 11/27/2021, 2:46 PM

## 2021-11-27 NOTE — Progress Notes (Signed)
CPT not done due to patient sleeping

## 2021-11-27 NOTE — Progress Notes (Addendum)
SLP Cancellation Note  Patient Details Name: Karen Casey MRN: [id number] DOB: 01-23-1956   Cancelled treatment:       Reason Eval/Treat Not Completed: Other (comment);Medical issues which prohibited therapy (Pt today with elevated RR, decreased oxygen saturation, congested sounding breathing and only producing 1-2 breathy words per breath group.) Yesterday her voice was strong as she was frequently yelling out.  SLP will defer tx at this time due to worsening vitals today. Will follow up on Monday, 11/30/2021. If SLP needed over the weekend, please call [phone] or page [phone].  Kathleen Lime, MS [facility] SLP Acute Rehab Services Office [phone] Pager [phone]  Macario Golds 11/27/2021, 12:28 PM

## 2021-11-28 LAB — GLUCOSE, CAPILLARY
Glucose-Capillary: 144 mg/dL — ABNORMAL HIGH (ref 70–99)
Glucose-Capillary: 130 mg/dL — ABNORMAL HIGH (ref 70–99)
Glucose-Capillary: 119 mg/dL — ABNORMAL HIGH (ref 70–99)
Glucose-Capillary: 126 mg/dL — ABNORMAL HIGH (ref 70–99)
Glucose-Capillary: 108 mg/dL — ABNORMAL HIGH (ref 70–99)

## 2021-11-28 LAB — PHOSPHORUS: Phosphorus: 2.5 mg/dL (ref 2.5–4.6)

## 2021-11-28 LAB — BASIC METABOLIC PANEL
Anion gap: 5 (ref 5–15)
BUN: 10 mg/dL (ref 8–23)
CO2: 34 mmol/L — ABNORMAL HIGH (ref 22–32)
Calcium: 8.2 mg/dL — ABNORMAL LOW (ref 8.9–10.3)
Chloride: 99 mmol/L (ref 98–111)
Creatinine, Ser: 0.56 mg/dL (ref 0.44–1.00)
GFR, Estimated: >60 mL/min (ref >60)
Glucose, Bld: 141 mg/dL — ABNORMAL HIGH (ref 70–99)
Potassium: 3.9 mmol/L (ref 3.5–5.1)
Sodium: 138 mmol/L (ref 135–145)

## 2021-11-28 LAB — ANTI-DNA ANTIBODY, DOUBLE-STRANDED: ds DNA Ab: <1 IU/mL (ref 0–9)

## 2021-11-28 LAB — MAGNESIUM: Magnesium: 2.2 mg/dL (ref 1.7–2.4)

## 2021-11-28 LAB — ANA: Anti Nuclear Antibody (ANA): NEGATIVE

## 2021-11-28 LAB — RPR: RPR Ser Ql: NONREACTIVE

## 2021-11-28 LAB — GLOMERULAR BASEMENT MEMBRANE ANTIBODIES: GBM Ab: <0.2 units (ref 0.0–0.9)

## 2021-11-28 LAB — SEDIMENTATION RATE: Sed Rate: 75 mm/hr — ABNORMAL HIGH (ref 0–22)

## 2021-11-28 MED ORDER — METOPROLOL TARTRATE 5 MG/5ML IV SOLN
2.5000 mg | INTRAVENOUS | Status: AC | PRN
  Administered 2021-11-28: 2.5 mg via INTRAVENOUS
  Filled 2021-11-28: qty 5

## 2021-11-28 NOTE — Progress Notes (Signed)
[place] Progress Note Patient Name: Karen Casey DOB: 21-Mar-1956 MRN: [id number]   Date of Service  11/28/2021  HPI/Events of Note  Patient with paroxysmal atrial flutter with RVR (rate 150's), she is currently in sinus rhythm with a rate of 106, BP 119/82, MAP 93. K+ 3.9, Mg++ 2.2.  eICU Interventions  Lopressor 2.5 mg iv now (for membrane stabilizing effect), then iv Q 4 hours PRN rate > 120, with a hold parameter of SBP < 110 mmHg.        Kerry Kass Ivet Guerrieri 11/28/2021, 4:57 AM

## 2021-11-28 NOTE — Plan of Care (Signed)

## 2021-11-28 NOTE — Progress Notes (Signed)
Patient removed NG tube. MD notified and asked RN to try bedside swallow study. Patient passed and placed on clear liquid diet.

## 2021-11-28 NOTE — Progress Notes (Signed)
Informed e-link, Lysbeth Galas, RN, that this patient's HR jumped from 110 to >150 bpm and continued for a period of time. Labs were collected and e-link requested an EKG. EKG obtained.

## 2021-11-28 NOTE — Progress Notes (Signed)
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  3 days prior to this patient had a fall. CT chest concerning for groundglass nodular opacities patient placed on antibiotics. On 11/22/2021 rapid response was called due to hypoxemia and increased work of breathing and increased oxygen demand to 15 L patient was transferred to stepdown unit. CT angiogram of the chest did not show pulmonary embolism but showed extensive patchy groundglass infiltrates in both upper lobes suggestive of multifocal pneumonia. Abdominal x-ray with dilated small bowel loops of severe diffuse adynamic ileus. 11/25/2021-she had altered mental status overnight.  Patient was on Ativan oxycodone and Seroquel and Precedex.  Seroquel has been stopped.  Potassium was 5.3 potassium replacement was stopped, patient is on Lasix every 8.  She had watery loose stools and Flexi-Seal was placed. Blood pressure remains soft hypotensive 85/53 she was started on phenylephrine IV infusion through the peripheral IV and IV fluid bolus was given. NG tube was placed for ileus. CT head showed no acute intracranial pathology.  Bifrontal atrophy and chronic microvascular ischemic changes. Ammonia level was normal. Echo with EF 60 to 65% no regional wall motion abnormalities.  Grade 1 diastolic dysfunction.  11/28/2021-patient is more awake than yesterday and is able to follow commands and answer questions appropriately and talking appropriately.  Assessment & Plan:   Principal Problem:   Right rib fracture Active Problems:   Elevated LFTs   Tobacco abuse   Alcohol use   Paroxysmal atrial fibrillation (HCC)    Chronic obstructive pulmonary disease, group B, by Global Initiative for Chronic Obstructive Lung Disease 2017 classification ([place])   Hypokalemia   Community acquired pneumonia   Aspiration pneumonia ([place])   Acute respiratory failure with hypoxia ([place])   Encephalopathy acute  #1 acute hypoxic respiratory failure secondary to multifocal pneumonia likely from aspiration versus CAP Was on Rocephin and azithromycin Antibiotics switched to Zosyn due to persistent worsening of bilateral infiltrates and increasing oxygen demand Oxygen requirement improving now on 6 L from 15 L. Leukocytosis resolved. Check blood cultures and UA. MRSA PCR negative. I discussed with her significant other abdominal patient and him has been living together for 30+ years and has a daughter who is 20 years old.  He says her baseline is normally she is mentally clear and is able to talk she does not walk much due to neuropathy she watches TV sitting up in her chair or sofa that is about it. On lasix 40 mg daily added kdur Chest x-ray from 11/27/2021 still with infiltrates.  #2 adynamic ileus resolved NG tube in place for feeding and for medications.   Speech therapy consulted however she was more drowsy last 2 days they were unable to do evaluation.  #3 acute metabolic encephalopathy secondary to #1  #4 chronic alcohol abuse on CIWA protocol Trying high dose thiamine   #5 paroxysmal atrial fibrillation not on any anticoagulation due to heavy alcohol abuse and risk of fall.  #6 vertebral compression fractures supportive treatment  #7 n.p.o. status NG tube in place for feeds and medications Await speech evaluation  #8 multiple electrolyte abnormalities hypokalemia, hypomagnesemia, hypophosphatemia repleted.   Add potassium daily with Lasix on board.  Estimated body mass index is 27.73 kg/m as calculated from the following:   Height as of this encounter: '5\' 3"'$  (1.6 m).   Weight as of this encounter: 71  kg.  DVT prophylaxis: Heparin  code Status: Full code  family Communication: None at bedside  disposition Plan:  Status is: Inpatient Remains inpatient appropriate because: NG tube IV antibiotics still hypoxic   Consultants:  PCCM  Procedures: NG tube placed 11/25/2021 Antimicrobials: Zosyn  Subjective:  Patient is resting in bed she is awake Able to follow commands Answering questions appropriately Objective: Vitals:   11/28/21 0600 11/28/21 0828 11/28/21 1000 11/28/21 1132  BP: 117/65  133/74   Pulse: 97  (!) 101   Resp: 14  20   Temp:  (!) 97.5 F (36.4 C)  98.4 F (36.9 C)  TempSrc:  Oral  Oral  SpO2: 96%  96%   Weight:      Height:        Intake/Output Summary (Last 24 hours) at 11/28/2021 1307 Last data filed at 11/28/2021 1132 Gross per 24 hour  Intake 508.17 ml  Output 1850 ml  Net -1341.83 ml    Filed Weights   11/24/21 0358 11/25/21 0500 11/28/21 0403  Weight: 75 kg 75.9 kg 71 kg    Examination:NG tube in place Tolerating tube feeds Rectal tube with liquid stool  General exam: Appears chronically ill-appearing  respiratory system: Rhonchi to auscultation. Respiratory effort normal. Cardiovascular system: S1 & S2 heard, RRR. No JVD, murmurs, rubs, gallops or clicks. No pedal edema. Gastrointestinal system: Abdomen is nondistended, soft and nontender. No organomegaly or masses felt. Normal bowel sounds heard. Central nervous system: Alert and oriented. No focal neurological deficits. Extremities: Trace edema  skin: No rashes, lesions or ulcers Psychiatry: unable to assess  Data Reviewed: I have personally reviewed following labs and imaging studies  CBC: Recent Labs  Lab 11/23/21 0303 11/24/21 0240 11/25/21 0312 11/26/21 0333 11/27/21 0157  WBC 19.2* 18.3* 24.5* 9.8 9.2  NEUTROABS 15.9* 16.8* 21.6* 8.0*  --   HGB 13.2 12.9 12.3 11.6* 11.1*  HCT 40.3 39.3 38.4 35.7* 33.8*  MCV 103.1* 105.1* 104.6* 103.5* 101.2*  PLT 189 157 248 145* 144*     Basic Metabolic Panel: Recent Labs  Lab 11/24/21 0240 11/25/21 0312 11/25/21 0552 11/25/21 1139 11/26/21 0333 11/26/21 1700 11/27/21 0157 11/27/21 1700 11/27/21 2033 11/28/21 0400  NA 136 139  --  139 143  --  141  --  141 138  K 4.4 5.3*  --  5.0 3.5  --  2.7*  --  4.0 3.9  CL 103 104  --  105 99  --  97*  --  101 99  CO2 26 27  --  27 31  --  35*  --  35* 34*  GLUCOSE 107* 98  --  97 85  --  107*  --  136* 141*  BUN 16 QUANTITY NOT SUFFICIENT, UNABLE TO PERFORM TEST   < > 13 10  --  8  --  9 10  CREATININE 0.80 QUANTITY NOT SUFFICIENT, UNABLE TO PERFORM TEST   < > 0.53 0.58  --  0.44  --  0.55 0.56  CALCIUM 7.7* 7.9*  --  7.5* 7.9*  --  7.8*  --  8.4* 8.2*  MG 2.3 1.9  --   --   --  1.4* 1.3* 2.2  --  2.2  PHOS 4.3  --   --   --   --  1.8* 2.2* 2.3*  --  2.5   < > = values in this interval not displayed.    GFR: Estimated Creatinine Clearance: 67.9 mL/min (by C-G formula based on SCr of 0.56 mg/dL). Liver Function Tests: Recent Labs  Lab 11/22/21 0321 11/23/21 0303 11/25/21 0312 11/25/21 0552  AST 100* 41 QUANTITY NOT SUFFICIENT, UNABLE TO PERFORM TEST 31  ALT 84* 54* 37  --   ALKPHOS 153* 130* 132*  --   BILITOT 0.9 0.6 1.0  --   PROT 4.7* 4.5* 5.2*  --   ALBUMIN 1.9* 1.7* 1.9*  --     Recent Labs  Lab 11/22/21 0958  LIPASE 20    Recent Labs  Lab 11/23/21 0303 11/25/21 0040  AMMONIA 39* 14    Coagulation Profile: No results for input(s): "INR", "PROTIME" in the last 168 hours.  Cardiac Enzymes: No results for input(s): "CKTOTAL", "CKMB", "CKMBINDEX", "TROPONINI" in the last 168 hours.  BNP (last 3 results) No results for input(s): "PROBNP" in the last 8760 hours. HbA1C: No results for input(s): "HGBA1C" in the last 72 hours. CBG: Recent Labs  Lab 11/27/21 2015 11/27/21 2344 11/28/21 0349 11/28/21 0828 11/28/21 1130  GLUCAP 139* 155* 144* 130* 119*    Lipid Profile: No results for input(s): "CHOL", "HDL", "LDLCALC", "TRIG",  "CHOLHDL", "LDLDIRECT" in the last 72 hours. Thyroid Function Tests: Recent Labs    11/27/21 2032  TSH 6.711*  FREET4 0.83   Anemia Panel: Recent Labs    11/27/21 2032  VITAMINB12 1,248*   Sepsis Labs: Recent Labs  Lab 11/22/21 1003 11/22/21 1649 11/23/21 0642 11/23/21 0915  LATICACIDVEN 2.8* 2.8* 1.7 1.4     Recent Results (from the past 240 hour(s))  Culture, blood (Routine x 2)     Status: None   Collection Time: 11/20/21  8:58 PM   Specimen: BLOOD  Result Value Ref Range Status   Specimen Description   Final    BLOOD BLOOD RIGHT HAND Performed at [facility] [address] [place], [place] [address]    Special Requests   Final    BOTTLES DRAWN AEROBIC AND ANAEROBIC Blood Culture results may not be optimal due to an inadequate volume of blood received in culture bottles Performed at [facility] [address] [place], [place] [address]    Culture   Final    NO GROWTH 5 DAYS Performed at [facility], [place] [address] [place], [place] [address]    Report Status 11/26/2021 FINAL  Final  Culture, blood (Routine x 2)     Status: None   Collection Time: 11/20/21  9:03 PM   Specimen: BLOOD  Result Value Ref Range Status   Specimen Description   Final    BLOOD BLOOD LEFT HAND Performed at [facility] [address] [place], [place] [address]    Special Requests   Final    BOTTLES DRAWN AEROBIC AND ANAEROBIC Blood Culture adequate volume Performed at [facility] [address] [place], [place] [address]    Culture   Final    NO GROWTH 5 DAYS Performed at [facility], [place] [address] [name], [place] [address]    Report Status 11/26/2021 FINAL  Final  Urine Culture     Status: Abnormal   Collection Time: 11/21/21  8:48 AM   Specimen: Urine, Clean Catch  Result Value Ref Range Status   Specimen Description   Final    URINE,  CLEAN CATCH Performed at [facility], [place] [address], [place], [place] [address]    Special Requests   Final    NONE Performed at [facility], [place] [address], [place], [place] [address]    Culture MULTIPLE SPECIES PRESENT, SUGGEST RECOLLECTION (A)  Final   Report Status 11/22/2021 FINAL  Final  MRSA Next Gen by PCR, Nasal     Status: None   Collection Time: 11/22/21  9:38 AM   Specimen: Nasal Mucosa; Nasal Swab  Result Value Ref Range Status   MRSA by PCR Next Gen NOT DETECTED NOT DETECTED Final    Comment: (NOTE) The GeneXpert MRSA Assay (FDA approved for NASAL specimens only), is one component of a comprehensive MRSA colonization surveillance program. It is not intended to diagnose MRSA infection nor to guide or monitor treatment for MRSA infections. Test performance is not FDA approved in patients less than 26 years old. Performed at [facility], [place] [address], [place], [place] [address]   Respiratory (~20 pathogens) panel by PCR     Status: None   Collection Time: 11/23/21  9:16 AM   Specimen: Nasopharyngeal Swab; Respiratory  Result Value Ref Range Status   Adenovirus NOT DETECTED NOT DETECTED Final   Coronavirus 229E NOT DETECTED NOT DETECTED Final    Comment: (NOTE) The Coronavirus on the Respiratory Panel, DOES NOT test for the novel  Coronavirus (2019 nCoV)    Coronavirus HKU1 NOT DETECTED NOT DETECTED Final   Coronavirus NL63 NOT DETECTED NOT DETECTED Final   Coronavirus OC43 NOT DETECTED NOT DETECTED Final   Metapneumovirus NOT DETECTED NOT DETECTED Final   Rhinovirus / Enterovirus NOT DETECTED NOT DETECTED Final   Influenza A NOT DETECTED NOT DETECTED Final   Influenza B NOT DETECTED NOT DETECTED Final   Parainfluenza Virus 1 NOT DETECTED NOT DETECTED Final   Parainfluenza Virus 2 NOT DETECTED NOT DETECTED Final   Parainfluenza Virus 3 NOT DETECTED NOT DETECTED Final   Parainfluenza Virus 4 NOT DETECTED NOT DETECTED Final   Respiratory  Syncytial Virus NOT DETECTED NOT DETECTED Final   Bordetella pertussis NOT DETECTED NOT DETECTED Final   Bordetella Parapertussis NOT DETECTED NOT DETECTED Final   Chlamydophila pneumoniae NOT DETECTED NOT DETECTED Final   Mycoplasma pneumoniae NOT DETECTED NOT DETECTED Final    Comment: Performed at [facility], [place]. [address] [place], [place] [address]  Culture, blood (Routine X 2) w Reflex to ID Panel     Status: None (Preliminary result)   Collection Time: 11/25/21 11:16 PM   Specimen: BLOOD  Result Value Ref Range Status   Specimen Description   Final    BLOOD LEFT ANTECUBITAL Performed at [facility] [address] [place], [place] [address]    Special Requests   Final    BOTTLES DRAWN AEROBIC ONLY Blood Culture adequate volume Performed at [facility] [address] [place],  [address]    Culture   Final    NO GROWTH 2 DAYS Performed at [facility] [address] [place],  [address]    Report Status PENDING  Incomplete  Culture, blood (Routine X 2) w Reflex to ID Panel     Status: None (Preliminary result)   Collection Time: 11/25/21 11:16 PM   Specimen: BLOOD  Result Value Ref Range Status   Specimen Description   Final    BLOOD RIGHT ANTECUBITAL Performed at [facility], [address] [place], [place] [address]    Special Requests   Final    BOTTLES DRAWN AEROBIC ONLY Blood Culture results may not be optimal due to an inadequate volume of blood received in culture bottles Performed at [facility] [address], [place], [place] [address]    Culture   Final    NO GROWTH 2 DAYS Performed at [facility] [address], [place], [place] [address]    Report Status PENDING  Incomplete         Radiology Studies: DG CHEST PORT 1 VIEW  Result Date: 11/27/2021 CLINICAL DATA:  NG placement. EXAM: PORTABLE CHEST 1 VIEW COMPARISON:  Chest  radiograph dated 11/27/2021. FINDINGS: Enteric tube extends below the diaphragm with side-port in the left upper abdomen and tip beyond the inferior margin of the image. Right-sided PICC in similar position. Bilateral opacities similar to prior radiograph. No pleural effusion or pneumothorax. The cardiac silhouette is within limits. No acute osseous pathology. IMPRESSION: 1. Enteric tube with tip beyond the inferior margin of the image. 2. Bilateral pulmonary opacities similar to prior radiograph. Electronically Signed   By: Anner Crete M.D.   On: 11/27/2021 22:07   DG CHEST PORT 1 VIEW  Result Date: 11/27/2021 CLINICAL DATA:  Respiratory failure. EXAM: PORTABLE CHEST 1 VIEW COMPARISON:  11/24/2021. FINDINGS: 4:32 a.m., 11/27/2021. Enteric tube is well inside the stomach but tip projected in the distal body of stomach with right PICC tip at the superior cavoatrial junction. The patient is rotated to the left today. There is a low inspiration. Interstitial and widespread patchy opacities of the lung fields continue to be seen. There is slight improvement in aeration of the right base but otherwise no changes in the overall aeration and small right pleural effusion. The mediastinum is normally outlined.  No acute osseous findings. IMPRESSION: Slightly improved right basilar aeration. No other interval change in the bilateral lung opacities. Electronically Signed   By: Telford Nab M.D.   On: 11/27/2021 07:29        Scheduled Meds:  arformoterol  15 mcg Nebulization BID   busPIRone  10 mg Per Tube TID   Chlorhexidine Gluconate Cloth  6 each Topical Daily   feeding supplement (OSMOLITE 1.5 CAL)  1,000 mL Per Tube Q24H   feeding supplement (PROSource TF)  45 mL Per Tube BID   folic acid  1 mg Per Tube Daily   furosemide  40 mg Intravenous Daily   gabapentin  600 mg Per Tube TID   guaiFENesin  15 mL Per Tube Q6H   heparin  5,000 Units Subcutaneous Q8H   multivitamin  15 mL Per Tube Daily    nicotine  21 mg Transdermal Daily   nortriptyline  50 mg Per Tube QHS   pantoprazole (PROTONIX) IV  40 mg Intravenous Q12H   potassium chloride  40 mEq Per Tube Daily   potassium chloride  40 mEq Per Tube Daily   revefenacin  175 mcg Nebulization Daily   sodium chloride flush  10-40 mL Intracatheter Q12H   Continuous Infusions:  sodium chloride Stopped (11/25/21 0135)   thiamine injection Stopped (11/27/21 1259)     LOS: 6 days    Time spent:38 min  Georgette Shell, MD 11/28/2021, 1:07 PM

## 2021-11-28 NOTE — Progress Notes (Signed)
TRH floor coverage for both [facility] and [facility] (remote) on night of 11/27/21 into morning of 11/28/21:    I was notified by RN that this patient with NGT and associated tube feedings, was noted this evening to have NGT ext length of 65 cm, compared to NGT ext length of 61 cm during preceding day shift.  I asked RN to please advance NGT 4 cm.  I then obtained plain film of the chest, which appear to demonstrate appropriate placement of NGT.      Babs Bertin, DO Hospitalist

## 2021-11-29 LAB — BASIC METABOLIC PANEL
Anion gap: 6 (ref 5–15)
BUN: 9 mg/dL (ref 8–23)
CO2: 35 mmol/L — ABNORMAL HIGH (ref 22–32)
Calcium: 8 mg/dL — ABNORMAL LOW (ref 8.9–10.3)
Chloride: 96 mmol/L — ABNORMAL LOW (ref 98–111)
Creatinine, Ser: 0.46 mg/dL (ref 0.44–1.00)
GFR, Estimated: >60 mL/min (ref >60)
Glucose, Bld: 82 mg/dL (ref 70–99)
Potassium: 4.4 mmol/L (ref 3.5–5.1)
Sodium: 137 mmol/L (ref 135–145)

## 2021-11-29 LAB — MAGNESIUM
Magnesium: 1.6 mg/dL — ABNORMAL LOW (ref 1.7–2.4)
Magnesium: 2.4 mg/dL (ref 1.7–2.4)

## 2021-11-29 LAB — PHOSPHORUS
Phosphorus: 2.1 mg/dL — ABNORMAL LOW (ref 2.5–4.6)
Phosphorus: 4.9 mg/dL — ABNORMAL HIGH (ref 2.5–4.6)

## 2021-11-29 LAB — RHEUMATOID FACTOR: Rheumatoid fact SerPl-aCnc: 10 IU/mL (ref <14.0)

## 2021-11-29 LAB — GLUCOSE, CAPILLARY
Glucose-Capillary: 79 mg/dL (ref 70–99)
Glucose-Capillary: 94 mg/dL (ref 70–99)
Glucose-Capillary: 103 mg/dL — ABNORMAL HIGH (ref 70–99)
Glucose-Capillary: 76 mg/dL (ref 70–99)
Glucose-Capillary: 78 mg/dL (ref 70–99)

## 2021-11-29 MED ORDER — BUSPIRONE HCL 10 MG PO TABS
10.0000 mg | ORAL_TABLET | Freq: Three times a day (TID) | ORAL | Status: DC
Start: 2021-11-29 — End: 2021-12-01
  Administered 2021-11-29 – 2021-12-01 (×7): 10 mg via ORAL
  Filled 2021-11-29 (×7): qty 1

## 2021-11-29 MED ORDER — SODIUM PHOSPHATES 45 MMOLE/15ML IV SOLN
30.0000 mmol | Freq: Once | INTRAVENOUS | Status: AC
  Administered 2021-11-29: 30 mmol via INTRAVENOUS
  Filled 2021-11-29: qty 10

## 2021-11-29 MED ORDER — ADULT MULTIVITAMIN W/MINERALS CH
1.0000 | ORAL_TABLET | Freq: Every day | ORAL | Status: DC
  Administered 2021-11-29 – 2021-12-01 (×3): 1 via ORAL
  Filled 2021-11-29 (×3): qty 1

## 2021-11-29 MED ORDER — NORTRIPTYLINE HCL 25 MG PO CAPS
50.0000 mg | ORAL_CAPSULE | Freq: Every day | ORAL | Status: DC
Start: 2021-11-29 — End: 2021-12-01
  Administered 2021-11-29 – 2021-11-30 (×2): 50 mg via ORAL
  Filled 2021-11-29 (×3): qty 2

## 2021-11-29 MED ORDER — FOLIC ACID 1 MG PO TABS
1.0000 mg | ORAL_TABLET | Freq: Every day | ORAL | Status: DC
  Administered 2021-11-29 – 2021-12-01 (×3): 1 mg via ORAL
  Filled 2021-11-29 (×3): qty 1

## 2021-11-29 MED ORDER — TRAMADOL HCL 50 MG PO TABS
50.0000 mg | ORAL_TABLET | Freq: Four times a day (QID) | ORAL | Status: DC | PRN
  Administered 2021-11-29 – 2021-11-30 (×3): 50 mg via ORAL
  Filled 2021-11-29 (×3): qty 1

## 2021-11-29 MED ORDER — LOPERAMIDE HCL 2 MG PO CAPS
4.0000 mg | ORAL_CAPSULE | ORAL | Status: DC | PRN
  Administered 2021-11-29 – 2021-12-01 (×3): 4 mg via ORAL
  Filled 2021-11-29 (×3): qty 2

## 2021-11-29 MED ORDER — ONDANSETRON HCL 4 MG/2ML IJ SOLN: 4.0000 mg | Freq: Four times a day (QID) | INTRAMUSCULAR | Status: DC | PRN

## 2021-11-29 MED ORDER — GABAPENTIN 300 MG PO CAPS
600.0000 mg | ORAL_CAPSULE | Freq: Three times a day (TID) | ORAL | Status: DC
Start: 2021-11-29 — End: 2021-12-01
  Administered 2021-11-29 – 2021-12-01 (×7): 600 mg via ORAL
  Filled 2021-11-29 (×7): qty 2

## 2021-11-29 MED ORDER — GUAIFENESIN 100 MG/5ML PO LIQD
15.0000 mL | Freq: Four times a day (QID) | ORAL | Status: DC
  Administered 2021-11-29 – 2021-12-01 (×9): 15 mL via ORAL
  Filled 2021-11-29 (×9): qty 20

## 2021-11-29 MED ORDER — POTASSIUM CHLORIDE CRYS ER 20 MEQ PO TBCR
40.0000 meq | EXTENDED_RELEASE_TABLET | Freq: Every day | ORAL | Status: DC
  Administered 2021-11-29 – 2021-12-01 (×3): 40 meq via ORAL
  Filled 2021-11-29 (×3): qty 2

## 2021-11-29 MED ORDER — ONDANSETRON HCL 4 MG PO TABS: 4.0000 mg | ORAL_TABLET | Freq: Four times a day (QID) | ORAL | Status: DC | PRN

## 2021-11-29 MED ORDER — MAGNESIUM SULFATE 4 GM/100ML IV SOLN
4.0000 g | Freq: Once | INTRAVENOUS | Status: AC
  Administered 2021-11-29: 4 g via INTRAVENOUS
  Filled 2021-11-29: qty 100

## 2021-11-29 NOTE — Progress Notes (Signed)
   D/w DR Kathreen Cosier of [facility]  Mental status improved PAtient being transffereed out of ICU CCM no need to see    SIGNATURE    Dr. Brand Males, M.D., F.C.C.P,  Pulmonary and Critical Care Medicine Staff Physician, [place] Director - Interstitial Lung Disease  Program  Medical Director - [place] [facility] [place] [facility] [place], [place], [address]  NPI Number:  NPI #[id number] [facility] Number: [id number]  Pager: [phone], If no answer  -[place] or [place] [phone] Telephone (clinical office): [phone] Telephone (research): [phone]  10:01 AM 11/29/2021

## 2021-11-29 NOTE — Progress Notes (Signed)
Report was called to Nolon Bussing. All questions answered at this time. All pt belongings and paper chart transported with patient. Patient was transported upstairs in the bed by RN and NT on tele and 2L O2 via [place]. Handoff completed.

## 2021-11-29 NOTE — Progress Notes (Signed)
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  3 days prior to this patient had a fall. CT chest concerning for groundglass nodular opacities patient placed on antibiotics. On 11/22/2021 rapid response was called due to hypoxemia and increased work of breathing and increased oxygen demand to 15 L patient was transferred to stepdown unit. CT angiogram of the chest did not show pulmonary embolism but showed extensive patchy groundglass infiltrates in both upper lobes suggestive of multifocal pneumonia. Abdominal x-ray with dilated small bowel loops of severe diffuse adynamic ileus. 11/25/2021-she had altered mental status overnight.  Patient was on Ativan oxycodone and Seroquel and Precedex.  Seroquel has been stopped.  Potassium was 5.3 potassium replacement was stopped, patient is on Lasix every 8.  She had watery loose stools and Flexi-Seal was placed. Blood pressure remains soft hypotensive 85/53 she was started on phenylephrine IV infusion through the peripheral IV and IV fluid bolus was given. NG tube was placed for ileus. CT head showed no acute intracranial pathology.  Bifrontal atrophy and chronic microvascular ischemic changes. Ammonia level was normal. Echo with EF 60 to 65% no regional wall motion abnormalities.  Grade 1 diastolic dysfunction.  11/28/2021-patient is more awake than yesterday and is able to follow commands and answer questions appropriately and talking appropriately.  Assessment & Plan:   Principal Problem:   Right rib fracture Active Problems:   Elevated LFTs   Tobacco abuse   Alcohol use   Paroxysmal atrial fibrillation (HCC)    Chronic obstructive pulmonary disease, group B, by Global Initiative for Chronic Obstructive Lung Disease 2017 classification ([place])   Hypokalemia   Community acquired pneumonia   Aspiration pneumonia ([place])   Acute respiratory failure with hypoxia ([place])   Encephalopathy acute  #1 acute hypoxic respiratory failure secondary to multifocal pneumonia likely from aspiration versus CAP Was on Rocephin and azithromycin Antibiotics switched to Zosyn due to persistent worsening of bilateral infiltrates and increasing oxygen demand Oxygen requirement improving now on 2 L from 6 L from 15 L. Leukocytosis resolved. MRSA PCR negative. I discussed with her significant other abdominal patient and him has been living together for 30+ years and has a daughter who is 22 years old.  He says her baseline is normally she is mentally clear and is able to talk she does not walk much due to neuropathy she watches TV sitting up in her chair or sofa that is about it. On lasix 40 mg daily added kdur Chest x-ray from 11/27/2021 still with infiltrates.  #2 adynamic ileus resolved patient pulled out the NG tube yesterday she is more awake and alert today able to tolerate p.o. intake liquids.  Advance diet as tolerated.  Speech therapy evaluation tomorrow.  #3 acute metabolic encephalopathy secondary to #1 resolving  #4 chronic alcohol abuse on CIWA protocol Continue high dose thiamine for 7 days  #5 paroxysmal atrial fibrillation not on any anticoagulation due to heavy alcohol abuse and risk of fall.  #6 vertebral compression fractures supportive treatment  #7 multiple electrolyte abnormalities hypokalemia, hypomagnesemia, hypophosphatemia repleted.   Replete magnesium and phosphorus  Estimated body mass index is 27.69 kg/m as calculated from the following:   Height  as of this encounter: '5\' 3"'$  (1.6 m).   Weight as of this encounter: 70.9 kg.  DVT prophylaxis: Heparin  code Status: Full code  family  Communication: None at bedside  disposition Plan:  Status is: Inpatient Remains inpatient appropriate because: NG tube IV antibiotics still hypoxic   Consultants:  PCCM  Procedures: NG tube placed 11/25/2021 Antimicrobials: Zosyn  Subjective:  She is resting in bed able to carry out a conversation  Reports her husband was here yesterday she reports she has not walked in a long time she does not know how long she has not walked her husband lifts her up and moves around in the house  objective: Vitals:   11/29/21 1100 11/29/21 1102 11/29/21 1200 11/29/21 1300  BP:  114/69 132/86 (!) 157/88  Pulse: 89 91 88 87  Resp: (!) '21 16 14 13  '$ Temp:   (!) 97.3 F (36.3 C)   TempSrc:   Axillary   SpO2: 95% 97% 100% 99%  Weight:      Height:        Intake/Output Summary (Last 24 hours) at 11/29/2021 1310 Last data filed at 11/29/2021 1211 Gross per 24 hour  Intake 1529.29 ml  Output 1500 ml  Net 29.29 ml    Filed Weights   11/25/21 0500 11/28/21 0403 11/29/21 0320  Weight: 75.9 kg 71 kg 70.9 kg    Examination:NG tube in place Tolerating tube feeds Rectal tube with liquid stool  General exam: Appears chronically ill-appearing  respiratory system: Rhonchi to auscultation. Respiratory effort normal. Cardiovascular system: S1 & S2 heard, RRR. No JVD, murmurs, rubs, gallops or clicks. No pedal edema. Gastrointestinal system: Abdomen is nondistended, soft and nontender. No organomegaly or masses felt. Normal bowel sounds heard. Central nervous system: Alert and oriented. No focal neurological deficits. Extremities: Trace edema  skin: No rashes, lesions or ulcers Psychiatry: unable to assess  Data Reviewed: I have personally reviewed following labs and imaging studies  CBC: Recent Labs  Lab 11/23/21 0303 11/24/21 0240 11/25/21 0312 11/26/21 0333 11/27/21 0157  WBC 19.2* 18.3* 24.5* 9.8 9.2  NEUTROABS 15.9* 16.8* 21.6* 8.0*  --   HGB 13.2 12.9 12.3 11.6* 11.1*  HCT 40.3 39.3  38.4 35.7* 33.8*  MCV 103.1* 105.1* 104.6* 103.5* 101.2*  PLT 189 157 248 145* 144*    Basic Metabolic Panel: Recent Labs  Lab 11/26/21 0333 11/26/21 1700 11/27/21 0157 11/27/21 1700 11/27/21 2033 11/28/21 0400 11/29/21 0317 11/29/21 0735  NA 143  --  141  --  141 138  --  137  K 3.5  --  2.7*  --  4.0 3.9  --  4.4  CL 99  --  97*  --  101 99  --  96*  CO2 31  --  35*  --  35* 34*  --  35*  GLUCOSE 85  --  107*  --  136* 141*  --  82  BUN 10  --  8  --  9 10  --  9  CREATININE 0.58  --  0.44  --  0.55 0.56  --  0.46  CALCIUM 7.9*  --  7.8*  --  8.4* 8.2*  --  8.0*  MG  --  1.4* 1.3* 2.2  --  2.2 1.6*  --   PHOS  --  1.8* 2.2* 2.3*  --  2.5 2.1*  --     GFR: Estimated Creatinine Clearance: 67.9 mL/min (by C-G formula based on SCr of 0.46  mg/dL). Liver Function Tests: Recent Labs  Lab 11/23/21 0303 11/25/21 0312 11/25/21 0552  AST 41 QUANTITY NOT SUFFICIENT, UNABLE TO PERFORM TEST 31  ALT 54* 37  --   ALKPHOS 130* 132*  --   BILITOT 0.6 1.0  --   PROT 4.5* 5.2*  --   ALBUMIN 1.7* 1.9*  --     No results for input(s): "LIPASE", "AMYLASE" in the last 168 hours.  Recent Labs  Lab 11/23/21 0303 11/25/21 0040  AMMONIA 39* 14    Coagulation Profile: No results for input(s): "INR", "PROTIME" in the last 168 hours.  Cardiac Enzymes: No results for input(s): "CKTOTAL", "CKMB", "CKMBINDEX", "TROPONINI" in the last 168 hours.  BNP (last 3 results) No results for input(s): "PROBNP" in the last 8760 hours. HbA1C: No results for input(s): "HGBA1C" in the last 72 hours. CBG: Recent Labs  Lab 11/28/21 1535 11/28/21 1829 11/29/21 0404 11/29/21 0804 11/29/21 1138  GLUCAP 126* 108* 79 94 103*    Lipid Profile: No results for input(s): "CHOL", "HDL", "LDLCALC", "TRIG", "CHOLHDL", "LDLDIRECT" in the last 72 hours. Thyroid Function Tests: Recent Labs    11/27/21 2032  TSH 6.711*  FREET4 0.83    Anemia Panel: Recent Labs    11/27/21 2032  VITAMINB12  1,248*    Sepsis Labs: Recent Labs  Lab 11/22/21 1649 11/23/21 0642 11/23/21 0915  LATICACIDVEN 2.8* 1.7 1.4     Recent Results (from the past 240 hour(s))  Culture, blood (Routine x 2)     Status: None   Collection Time: 11/20/21  8:58 PM   Specimen: BLOOD  Result Value Ref Range Status   Specimen Description   Final    BLOOD BLOOD RIGHT HAND Performed at [facility] [address], [place], [place] [address]    Special Requests   Final    BOTTLES DRAWN AEROBIC AND ANAEROBIC Blood Culture results may not be optimal due to an inadequate volume of blood received in culture bottles Performed at [facility] [address] [place], [place] [address]    Culture   Final    NO GROWTH 5 DAYS Performed at [facility], [place] [address], [place], [place] [address]    Report Status 11/26/2021 FINAL  Final  Culture, blood (Routine x 2)     Status: None   Collection Time: 11/20/21  9:03 PM   Specimen: BLOOD  Result Value Ref Range Status   Specimen Description   Final    BLOOD BLOOD LEFT HAND Performed at [facility] [address], [place], [place] [address]    Special Requests   Final    BOTTLES DRAWN AEROBIC AND ANAEROBIC Blood Culture adequate volume Performed at [facility] [address], [place], [place] [address]    Culture   Final    NO GROWTH 5 DAYS Performed at [facility], [place] [address] [place], [place] [address]    Report Status 11/26/2021 FINAL  Final  Urine Culture     Status: Abnormal   Collection Time: 11/21/21  8:48 AM   Specimen: Urine, Clean Catch  Result Value Ref Range Status   Specimen Description   Final    URINE, CLEAN CATCH Performed at [facility], [place] [address], [place], [place] [address]    Special Requests   Final    NONE Performed at [facility], [place] [address] [place],  [address]    Culture  MULTIPLE SPECIES PRESENT, SUGGEST RECOLLECTION (A)  Final   Report Status 11/22/2021 FINAL  Final  MRSA Next Gen by PCR, Nasal     Status: None   Collection Time: 11/22/21  9:38 AM   Specimen: Nasal Mucosa; Nasal Swab  Result Value Ref Range Status   MRSA by PCR Next Gen NOT DETECTED NOT DETECTED Final    Comment: (NOTE) The GeneXpert MRSA Assay (FDA approved for NASAL specimens only), is one component of a comprehensive MRSA colonization surveillance program. It is not intended to diagnose MRSA infection nor to guide or monitor treatment for MRSA infections. Test performance is not FDA approved in patients less than 31 years old. Performed at [facility], [place] [address], [place], [place] [address]   Respiratory (~20 pathogens) panel by PCR     Status: None   Collection Time: 11/23/21  9:16 AM   Specimen: Nasopharyngeal Swab; Respiratory  Result Value Ref Range Status   Adenovirus NOT DETECTED NOT DETECTED Final   Coronavirus 229E NOT DETECTED NOT DETECTED Final    Comment: (NOTE) The Coronavirus on the Respiratory Panel, DOES NOT test for the novel  Coronavirus (2019 nCoV)    Coronavirus HKU1 NOT DETECTED NOT DETECTED Final   Coronavirus NL63 NOT DETECTED NOT DETECTED Final   Coronavirus OC43 NOT DETECTED NOT DETECTED Final   Metapneumovirus NOT DETECTED NOT DETECTED Final   Rhinovirus / Enterovirus NOT DETECTED NOT DETECTED Final   Influenza A NOT DETECTED NOT DETECTED Final   Influenza B NOT DETECTED NOT DETECTED Final   Parainfluenza Virus 1 NOT DETECTED NOT DETECTED Final   Parainfluenza Virus 2 NOT DETECTED NOT DETECTED Final   Parainfluenza Virus 3 NOT DETECTED NOT DETECTED Final   Parainfluenza Virus 4 NOT DETECTED NOT DETECTED Final   Respiratory Syncytial Virus NOT DETECTED NOT DETECTED Final   Bordetella pertussis NOT DETECTED NOT DETECTED Final   Bordetella Parapertussis NOT DETECTED NOT DETECTED Final   Chlamydophila pneumoniae NOT DETECTED NOT  DETECTED Final   Mycoplasma pneumoniae NOT DETECTED NOT DETECTED Final    Comment: Performed at [facility], [place]. [address], [place], [place] [address]  Culture, blood (Routine X 2) w Reflex to ID Panel     Status: None (Preliminary result)   Collection Time: 11/25/21 11:16 PM   Specimen: BLOOD  Result Value Ref Range Status   Specimen Description   Final    BLOOD LEFT ANTECUBITAL Performed at [facility] [address] [place] [address], [place], [place] [address]    Special Requests   Final    BOTTLES DRAWN AEROBIC ONLY Blood Culture adequate volume Performed at [facility] [address] [place], [place] [address]    Culture   Final    NO GROWTH 3 DAYS Performed at [facility], [place] [address], [place], [place] [address]    Report Status PENDING  Incomplete  Culture, blood (Routine X 2) w Reflex to ID Panel     Status: None (Preliminary result)   Collection Time: 11/25/21 11:16 PM   Specimen: BLOOD  Result Value Ref Range Status   Specimen Description   Final    BLOOD RIGHT ANTECUBITAL Performed at [facility] [address], [place], [place] [address]    Special Requests   Final    BOTTLES DRAWN AEROBIC ONLY Blood Culture results may not be optimal due to an inadequate volume of blood received in culture bottles Performed at [facility],  [place] [address] [place], [place] [address]    Culture   Final    NO GROWTH 3 DAYS Performed at [facility], [place] [address], [place], [place] [address]    Report Status PENDING  Incomplete         Radiology Studies: DG CHEST PORT 1 VIEW  Result Date: 11/27/2021 CLINICAL DATA:  NG placement. EXAM: PORTABLE CHEST 1 VIEW COMPARISON:  Chest radiograph dated 11/27/2021. FINDINGS: Enteric tube extends below the diaphragm with side-port in the left upper abdomen and tip beyond the inferior margin of the image. Right-sided PICC in similar position. Bilateral  opacities similar to prior radiograph. No pleural effusion or pneumothorax. The cardiac silhouette is within limits. No acute osseous pathology. IMPRESSION: 1. Enteric tube with tip beyond the inferior margin of the image. 2. Bilateral pulmonary opacities similar to prior radiograph. Electronically Signed   By: Anner Crete M.D.   On: 11/27/2021 22:07        Scheduled Meds:  arformoterol  15 mcg Nebulization BID   busPIRone  10 mg Oral TID   Chlorhexidine Gluconate Cloth  6 each Topical Daily   folic acid  1 mg Oral Daily   furosemide  40 mg Intravenous Daily   gabapentin  600 mg Oral TID   guaiFENesin  15 mL Oral Q6H   heparin  5,000 Units Subcutaneous Q8H   multivitamin with minerals  1 tablet Oral Daily   nicotine  21 mg Transdermal Daily   nortriptyline  50 mg Oral QHS   pantoprazole (PROTONIX) IV  40 mg Intravenous Q12H   potassium chloride  40 mEq Oral Daily   revefenacin  175 mcg Nebulization Daily   sodium chloride flush  10-40 mL Intracatheter Q12H   Continuous Infusions:  sodium chloride Stopped (11/25/21 0135)   sodium phosphate 30 mmol in dextrose 5 % 250 mL infusion 43 mL/hr at 11/29/21 1211   thiamine injection Stopped (11/29/21 1148)     LOS: 7 days    Time spent:38 min  Georgette Shell, MD 11/29/2021, 1:10 PM

## 2021-11-29 NOTE — Progress Notes (Signed)
Patient sleeping for 0000 chest PT

## 2021-11-30 ENCOUNTER — Telehealth: Payer: Self-pay | Admitting: Family Medicine

## 2021-11-30 LAB — COMPREHENSIVE METABOLIC PANEL
ALT: 23 U/L (ref 0–44)
AST: 27 U/L (ref 15–41)
Albumin: 1.9 g/dL — ABNORMAL LOW (ref 3.5–5.0)
Alkaline Phosphatase: 101 U/L (ref 38–126)
Anion gap: 6 (ref 5–15)
BUN: <5 mg/dL — ABNORMAL LOW (ref 8–23)
CO2: 33 mmol/L — ABNORMAL HIGH (ref 22–32)
Calcium: 8.4 mg/dL — ABNORMAL LOW (ref 8.9–10.3)
Chloride: 98 mmol/L (ref 98–111)
Creatinine, Ser: 0.64 mg/dL (ref 0.44–1.00)
GFR, Estimated: >60 mL/min (ref >60)
Glucose, Bld: 99 mg/dL (ref 70–99)
Potassium: 4.6 mmol/L (ref 3.5–5.1)
Sodium: 137 mmol/L (ref 135–145)
Total Bilirubin: 0.5 mg/dL (ref 0.3–1.2)
Total Protein: 4.8 g/dL — ABNORMAL LOW (ref 6.5–8.1)

## 2021-11-30 LAB — GLUCOSE, CAPILLARY
Glucose-Capillary: 102 mg/dL — ABNORMAL HIGH (ref 70–99)
Glucose-Capillary: 70 mg/dL (ref 70–99)
Glucose-Capillary: 70 mg/dL (ref 70–99)
Glucose-Capillary: 74 mg/dL (ref 70–99)
Glucose-Capillary: 81 mg/dL (ref 70–99)
Glucose-Capillary: 89 mg/dL (ref 70–99)
Glucose-Capillary: 89 mg/dL (ref 70–99)

## 2021-11-30 LAB — CBC
HCT: 30.1 % — ABNORMAL LOW (ref 36.0–46.0)
Hemoglobin: 10 g/dL — ABNORMAL LOW (ref 12.0–15.0)
MCH: 33.4 pg (ref 26.0–34.0)
MCHC: 33.2 g/dL (ref 30.0–36.0)
MCV: 100.7 fL — ABNORMAL HIGH (ref 80.0–100.0)
Platelets: 171 10*3/uL (ref 150–400)
RBC: 2.99 MIL/uL — ABNORMAL LOW (ref 3.87–5.11)
RDW: 14.2 % (ref 11.5–15.5)
WBC: 8.6 10*3/uL (ref 4.0–10.5)
nRBC: 0 % (ref 0.0–0.2)

## 2021-11-30 LAB — PHOSPHORUS: Phosphorus: 4 mg/dL (ref 2.5–4.6)

## 2021-11-30 LAB — CYCLIC CITRUL PEPTIDE ANTIBODY, IGG/IGA: CCP Antibodies IgG/IgA: <1 units (ref 0–19)

## 2021-11-30 LAB — MAGNESIUM: Magnesium: 1.9 mg/dL (ref 1.7–2.4)

## 2021-11-30 MED ORDER — PANTOPRAZOLE SODIUM 40 MG PO TBEC
40.0000 mg | DELAYED_RELEASE_TABLET | Freq: Two times a day (BID) | ORAL | Status: DC
Start: 2021-11-30 — End: 2021-12-01
  Administered 2021-11-30 – 2021-12-01 (×2): 40 mg via ORAL
  Filled 2021-11-30 (×2): qty 1

## 2021-11-30 NOTE — NC FL2 (Signed)
[place] LEVEL OF CARE SCREENING TOOL     IDENTIFICATION  Patient Name: Karen Casey Birthdate: 08-14-1955 Sex: female Admission Date (Current Location): 11/20/2021  [facility] and [place] Number:  Herbalist and Address:  [facility],  [place], [place]      Provider Number: [id number]  Attending Physician Name and Address:  Georgette Shell, MD  Relative Name and Phone Number:  spouse, Cecilie Kicks ([phone])    Current Level of Care: Hospital Recommended Level of Care: [place] Prior Approval Number:    Date Approved/Denied:   PASRR Number: Pending  Discharge Plan: SNF    Current Diagnoses: Patient Active Problem List   Diagnosis Date Noted   Encephalopathy acute    Acute respiratory failure with hypoxia ([place])    Aspiration pneumonia ([place]) 11/22/2021   Right rib fracture 11/20/2021   Elevated LFTs 11/20/2021   Osteoporosis 09/23/2021   Failure to thrive in adult 09/23/2021   Protein-calorie malnutrition, severe 07/28/2021   Vitamin D deficiency 07/03/2021   Peripheral neuropathy    Seborrheic keratoses 05/29/2021   Hepatic steatosis 01/05/2021   Hypokalemia 01/02/2021   Chronic obstructive pulmonary disease, group B, by Global Initiative for Chronic Obstructive Lung Disease 2017 classification ([place]) 12/09/2020   Chronic pain of breast 11/12/2020   Compression fracture of body of thoracic vertebra ([place]) 11/06/2020   SIADH (syndrome of inappropriate ADH production) ([place]) 10/12/2020   Protein-calorie malnutrition ([place]) 09/17/2020   Paroxysmal atrial fibrillation (HCC)    Shortness of breath    Alcohol withdrawal ([place]) 09/10/2020   Pulmonary nodules/lesions, multiple 07/14/2020   HTN (hypertension) 06/02/2018   Left foot pain 08/23/2017   Body mass index (BMI) 31.0-31.9, adult 03/29/2013   Community acquired pneumonia 03/14/2013   Anxiety disorder 06/12/2012   Alcohol use  05/28/2012   Benzodiazepine withdrawal without complication ([place]) 67/61/9509   Tobacco abuse 02/17/2012   GERD (gastroesophageal reflux disease) 02/04/2012   Depression 02/04/2012    Orientation RESPIRATION BLADDER Height & Weight     Self, Time, Situation, Place (Fluctuating orientation)  O2 Incontinent, External catheter Weight: 176 lb 5.9 oz (80 kg) Height:  '5\' 3"'$  (160 cm)  BEHAVIORAL SYMPTOMS/MOOD NEUROLOGICAL BOWEL NUTRITION STATUS      Incontinent    AMBULATORY STATUS COMMUNICATION OF NEEDS Skin   Extensive Assist Verbally                         Personal Care Assistance Level of Assistance  Bathing, Feeding, Dressing, Total care Bathing Assistance: Maximum assistance Feeding assistance: Maximum assistance Dressing Assistance: Maximum assistance Total Care Assistance: Maximum assistance   Functional Limitations Info  Sight, Speech, Hearing Sight Info: Adequate Hearing Info: Adequate Speech Info: Adequate    SPECIAL CARE FACTORS FREQUENCY  PT (By licensed PT)     PT Frequency: 5x/wk              Contractures Contractures Info: Not present    Additional Factors Info  Code Status, Allergies, Psychotropic Code Status Info: Full Allergies Info: Capsaicin-menthol, Diclo Gel (Diclofenac Sodium) Psychotropic Info: See MAR         Current Medications (11/30/2021):  This is the current hospital active medication list Current Facility-Administered Medications  Medication Dose Route Frequency Provider Last Rate Last Admin   0.9 %  sodium chloride infusion  250 mL Intravenous Continuous Anders Simmonds, MD   Stopped at 11/25/21 0135   albuterol (PROVENTIL) (  2.5 MG/3ML) 0.083% nebulizer solution 2.5 mg  2.5 mg Nebulization Q2H PRN Kristopher Oppenheim, DO   2.5 mg at 11/24/21 0646   arformoterol (BROVANA) nebulizer solution 15 mcg  15 mcg Nebulization BID Hunsucker, Bonna Gains, MD   15 mcg at 11/30/21 0960   busPIRone (BUSPAR) tablet 10 mg  10 mg Oral TID Georgette Shell, MD   10 mg at 11/29/21 2127   Chlorhexidine Gluconate Cloth 2 % PADS 6 each  6 each Topical Daily Florencia Reasons, MD   6 each at 45/40/98 1191   folic acid (FOLVITE) tablet 1 mg  1 mg Oral Daily Georgette Shell, MD   1 mg at 11/29/21 1006   furosemide (LASIX) injection 40 mg  40 mg Intravenous Daily Ollis, Brandi L, NP   40 mg at 11/29/21 1005   gabapentin (NEURONTIN) capsule 600 mg  600 mg Oral TID Georgette Shell, MD   600 mg at 11/29/21 2126   guaiFENesin (ROBITUSSIN) 100 MG/5ML liquid 15 mL  15 mL Oral Q6H Georgette Shell, MD   15 mL at 11/30/21 0559   heparin injection 5,000 Units  5,000 Units Subcutaneous Q8H Kristopher Oppenheim, DO   5,000 Units at 11/30/21 0559   ipratropium-albuterol (DUONEB) 0.5-2.5 (3) MG/3ML nebulizer solution 3 mL  3 mL Nebulization Q4H PRN Hunsucker, Bonna Gains, MD   3 mL at 11/27/21 1732   loperamide (IMODIUM) capsule 4 mg  4 mg Oral PRN Georgette Shell, MD   4 mg at 11/29/21 2126   multivitamin with minerals tablet 1 tablet  1 tablet Oral Daily Georgette Shell, MD   1 tablet at 11/29/21 1006   nicotine (NICODERM CQ - dosed in mg/24 hours) patch 21 mg  21 mg Transdermal Daily Kristopher Oppenheim, DO   21 mg at 11/29/21 1004   nortriptyline (PAMELOR) capsule 50 mg  50 mg Oral QHS Georgette Shell, MD   50 mg at 11/29/21 2126   ondansetron (ZOFRAN) tablet 4 mg  4 mg Oral Q6H PRN Georgette Shell, MD       Or   ondansetron [facility]) injection 4 mg  4 mg Intravenous Q6H PRN Georgette Shell, MD       pantoprazole (PROTONIX) injection 40 mg  40 mg Intravenous Claudie Leach, MD   40 mg at 11/29/21 2124   potassium chloride SA (KLOR-CON M) CR tablet 40 mEq  40 mEq Oral Daily Georgette Shell, MD   40 mEq at 11/29/21 1006   revefenacin (YUPELRI) nebulizer solution 175 mcg  175 mcg Nebulization Daily Hunsucker, Bonna Gains, MD   175 mcg at 11/30/21 4782   sodium chloride flush (NS) 0.9 % injection 10-40 mL  10-40 mL Intracatheter Q12H Georgette Shell, MD   10 mL at 11/29/21 2120   sodium chloride flush (NS) 0.9 % injection 10-40 mL  10-40 mL Intracatheter PRN Georgette Shell, MD       thiamine '500mg'$  in normal saline (51m) IVPB  500 mg Intravenous Q24H MGeorgette Shell MD   Stopped at 11/29/21 1148   traMADol (ULTRAM) tablet 50 mg  50 mg Oral Q6H PRN MGeorgette Shell MD   50 mg at 11/30/21 0435     Discharge Medications: Please see discharge summary for a list of discharge medications.  Relevant Imaging Results:  Relevant Lab Results:   Additional Information SSN: [id number] MVassie Moselle LCSW

## 2021-11-30 NOTE — Progress Notes (Signed)
Speech Language Pathology Treatment: Dysphagia  Patient Details Name: Karen Casey MRN: [id number] DOB: April 01, 1956 Today's Date: 11/30/2021 Time: [id number] SLP Time Calculation (min) (ACUTE ONLY): 15 min  Assessment / Plan / Recommendation Clinical Impression  Patient seen by SLP for skilled treatment session focused on dysphagia goals. When SLP arrived in room, patient had finished eating breakfast (seems to have eaten about 30%). She was pleasant and cooperative but remains very confused, telling SLP that she saw her nurse smoking outside last night. Her delayed recall is very poor as a nurse had just been in the room helping her with making a phone call when SLP arrived and less than 2 minutes later she tells SLP, "I forgot what that nurse told me about the phone". SLP assisted by writing down how to dial out for local calls and patient called her spouse. She then drank several sips of thin liquids via straw without observed difficulty or coughing, throat clearing. NG has been d/c'd and patient was started on regular solids, thin liquids diet this AM by MD. SLP reviewed recent therapy notes from SLPs during current admission and from April of 2022. In 2022 she had an MBS which showed swallow initiation delay to pyriforms with thin and nectar thick liquids, flash penetration with thin liquids but no aspiration and recommendation was for Dys 2 solids, thin liquids but she was advanced during subsequent therapy sessions to dysphagia 3 and then regular solids. SLP plans to return to observe patient with PO intake at a meal (today if schedule permits).     HPI HPI: 66 yo female adm to [facility] after being found down at home lying in feces. Pt has PMH + for ETOH, tobacco use, chrnoic smoking, dkysphagia, h/o pna - requiring intubation 3/29-4//06/2020.  Swallow eval ordered.  Per RN, pt's sister reports pt has problems swallowing prior to admission.  Pt underwent MBS 09/16/2020 with laryngeal penetration of  thin, but strong swallow reflx - oral deficits with recommendation for dys2/thin without straws.  CT head showed bifrontal atrophy and chronic microvascular ischemic changes.  Imaging 11/20/2021 showed acute rib fx and ? mild esophagitis.  CT chest concerning for pna, right middle lobe and right lower lobe worsening ground glass opacities.  Pt has an NG in place that is suctioning some yellow fluid.  Pt tells this SLP that she wants to go hom and wants a smoke.      SLP Plan  Continue with current plan of care      Recommendations for follow up therapy are one component of a multi-disciplinary discharge planning process, led by the attending physician.  Recommendations may be updated based on patient status, additional functional criteria and insurance authorization.    Recommendations  Diet recommendations: Regular;Thin liquid Liquids provided via: Cup;Straw Medication Administration: Whole meds with puree Supervision: Patient able to self feed Compensations: Small sips/bites;Slow rate Postural Changes and/or Swallow Maneuvers: Seated upright 90 degrees                Oral Care Recommendations: Staff/trained caregiver to provide oral care;Oral care BID Follow Up Recommendations: Skilled nursing-short term rehab (<3 hours/day) Assistance recommended at discharge: Frequent or constant Supervision/Assistance SLP Visit Diagnosis: Dysphagia, unspecified (R13.10);Dysphagia, oral phase (R13.11) Plan: Continue with current plan of care          Sonia Baller, MA, CCC-SLP Speech Therapy

## 2021-11-30 NOTE — Telephone Encounter (Signed)
Called and spoke with patient at her room in [place] as I saw she was more alert and transferred out of the ICU. She states she is feeling a little better, still can't walk but PT is working with her. She states she has people visiting and would like me to call back later.  Greatly appreciate CCM and [facility] team taking care of her at [facility].  Yehuda Savannah MD

## 2021-11-30 NOTE — Progress Notes (Signed)
Physical Therapy Treatment Patient Details Name: Karen Casey MRN: [id number] DOB: 04/07/1956 Today's Date: 11/30/2021   History of Present Illness Pt admitted from home with N/V/D and recent fall with minimally displaced R tenth rib.  Pt with hx of COPD, peripheral neuropathy, seizure and poly-substance abuse.Transfer to ICU 11/22/21 for respiratory distress.    PT Comments    Pt assisted to sitting EOB and performed 2 sets of exercises listed below with rest break.  Pt feels unable to stand reporting LE weakness limiting mobility despite describing use of RW.  Pt's SpO2 93% And 95% on room air during session.  Continue to recommend SNF upon d/c.    Recommendations for follow up therapy are one component of a multi-disciplinary discharge planning process, led by the attending physician.  Recommendations may be updated based on patient status, additional functional criteria and insurance authorization.  Follow Up Recommendations  Skilled nursing-short term rehab (<3 hours/day)     Assistance Recommended at Discharge Frequent or constant Supervision/Assistance  Patient can return home with the following Two people to help with walking and/or transfers;A lot of help with bathing/dressing/bathroom;Assistance with cooking/housework;Assist for transportation;Help with stairs or ramp for entrance   Equipment Recommendations  None recommended by PT    Recommendations for Other Services       Precautions / Restrictions Precautions Precautions: Fall     Mobility  Bed Mobility Overal bed mobility: Needs Assistance Bed Mobility: Supine to Sit, Sit to Supine     Supine to sit: Mod assist Sit to supine: Mod assist   General bed mobility comments: assist for lower body and scooting to EOB    Transfers                   General transfer comment: pt declined attempting stating she is too weak    Ambulation/Gait                   Stairs              Wheelchair Mobility    Modified Rankin (Stroke Patients Only)       Balance Overall balance assessment: Needs assistance Sitting-balance support: No upper extremity supported, Feet supported Sitting balance-Leahy Scale: Good                                      Cognition Arousal/Alertness: Awake/alert Behavior During Therapy: Anxious Overall Cognitive Status: Within Functional Limits for tasks assessed                                          Exercises General Exercises - Lower Extremity Ankle Circles/Pumps: Both, 10 reps, AAROM Long Arc Quad: AROM, Both, 10 reps, Seated Hip Flexion/Marching: AROM, Both, 10 reps, Seated    General Comments        Pertinent Vitals/Pain Pain Assessment Pain Assessment: No/denies pain    Home Living                          Prior Function            PT Goals (current goals can now be found in the care plan section) Progress towards PT goals: Progressing toward goals    Frequency    Min 2X/week  PT Plan Current plan remains appropriate    Co-evaluation              AM-PAC PT "6 Clicks" Mobility   Outcome Measure  Help needed turning from your back to your side while in a flat bed without using bedrails?: A Lot Help needed moving from lying on your back to sitting on the side of a flat bed without using bedrails?: A Lot Help needed moving to and from a bed to a chair (including a wheelchair)?: Total Help needed standing up from a chair using your arms (e.g., wheelchair or bedside chair)?: Total Help needed to walk in hospital room?: Total Help needed climbing 3-5 steps with a railing? : Total 6 Click Score: 8    End of Session   Activity Tolerance: Patient tolerated treatment well Patient left: in bed;with call bell/phone within reach;with bed alarm set   PT Visit Diagnosis: Muscle weakness (generalized) (M62.81);Difficulty in walking, not elsewhere  classified (R26.2);History of falling (Z91.81)     Time: 7903-8333 PT Time Calculation (min) (ACUTE ONLY): 30 min  Charges:  $Therapeutic Exercise: 8-22 mins $Therapeutic Activity: 8-22 mins                    Jannette Spanner PT, DPT Acute Rehabilitation Services Pager: [phone] Office: [place] 11/30/2021, 11:26 AM

## 2021-11-30 NOTE — Progress Notes (Signed)
[facility] Note   Patient Details  Name: Karen Casey Date of Birth: 1955-10-19 NCMUST ID: [id number]  Transition of Care (TOC) CM/SW Contact:    Vassie Moselle, LCSW Phone Number: 11/30/2021, 10:49 AM  To Whom It May Concern:  Please be advised that this patient will require a short-term nursing home stay - anticipated 30 days or less for rehabilitation and strengthening.   The plan is for return home.

## 2021-11-30 NOTE — Plan of Care (Signed)
Problem: Health Behavior/Discharge Planning: Goal: Ability to manage health-related needs will improve Outcome: Progressing   Problem: Clinical Measurements: Goal: Ability to maintain clinical measurements within normal limits will improve Outcome: Progressing   Problem: Coping: Goal: Level of anxiety will decrease Outcome: [place], RN 11/30/21 8:21 PM

## 2021-11-30 NOTE — Progress Notes (Signed)
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  3 days prior to this patient had a fall. CT chest concerning for groundglass nodular opacities patient placed on antibiotics. On 11/22/2021 rapid response was called due to hypoxemia and increased work of breathing and increased oxygen demand to 15 L patient was transferred to stepdown unit. CT angiogram of the chest did not show pulmonary embolism but showed extensive patchy groundglass infiltrates in both upper lobes suggestive of multifocal pneumonia. Abdominal x-ray with dilated small bowel loops of severe diffuse adynamic ileus. 11/25/2021-she had altered mental status overnight.  Patient was on Ativan oxycodone and Seroquel and Precedex.  Seroquel has been stopped.  Potassium was 5.3 potassium replacement was stopped, patient is on Lasix every 8.  She had watery loose stools and Flexi-Seal was placed. Blood pressure remains soft hypotensive 85/53 she was started on phenylephrine IV infusion through the peripheral IV and IV fluid bolus was given. NG tube was placed for ileus. CT head showed no acute intracranial pathology.  Bifrontal atrophy and chronic microvascular ischemic changes. Ammonia level was normal. Echo with EF 60 to 65% no regional wall motion abnormalities.  Grade 1 diastolic dysfunction.  11/28/2021-patient is more awake than yesterday and is able to follow commands and answer questions appropriately and talking appropriately.  Assessment & Plan:   Principal Problem:   Right rib fracture Active Problems:   Elevated LFTs   Tobacco abuse   Alcohol use   Paroxysmal atrial fibrillation (HCC)    Chronic obstructive pulmonary disease, group B, by Global Initiative for Chronic Obstructive Lung Disease 2017 classification ([place])   Hypokalemia   Community acquired pneumonia   Aspiration pneumonia ([place])   Acute respiratory failure with hypoxia ([place])   Encephalopathy acute  #1 acute hypoxic respiratory failure secondary to multifocal pneumonia likely from aspiration versus CAP Was on Rocephin and azithromycin Antibiotics switched to Zosyn due to persistent worsening of bilateral infiltrates and increasing oxygen demand Oxygen requirement improving now on 2 L from 6 L from 15 L. Leukocytosis resolved. MRSA PCR negative. I discussed with her significant other abdominal patient and him has been living together for 30+ years and has a daughter who is 93 years old.  He says her baseline is normally she is mentally clear and is able to talk she does not walk much due to neuropathy she watches TV sitting up in her chair or sofa that is about it. On lasix 40 mg daily added kdur Chest x-ray from 11/27/2021 still with infiltrates.  #2 adynamic ileus resolved #3 acute metabolic encephalopathy secondary to #1 resolved  #4 chronic alcohol abuse on CIWA protocol Continue high dose thiamine for 7 days  #5 paroxysmal atrial fibrillation not on any anticoagulation due to heavy alcohol abuse and risk of fall.  #6 vertebral compression fractures supportive treatment  #7 multiple electrolyte abnormalities hypokalemia, hypomagnesemia, hypophosphatemia repleted.   Replete magnesium and phosphorus  Estimated body mass index is 31.24 kg/m as calculated from the following:   Height as of this encounter: '5\' 3"'$  (1.6 m).   Weight as of this encounter: 80 kg.  DVT prophylaxis: Heparin  code Status: Full code  family Communication: None at  bedside  disposition Plan:  Status is: Inpatient Remains inpatient appropriate because: NG tube IV antibiotics still hypoxic   Consultants:  PCCM  Procedures: NG  tube placed 11/25/2021 Antimicrobials: Zosyn  Subjective:   She is resting in bed anxious to go home tearful about being away from family talking about her dog feels better breathing better swallowing better eating better has an appetite objective: Vitals:   11/30/21 0524 11/30/21 0823 11/30/21 1250 11/30/21 1332  BP: 102/72  123/84   Pulse: 94  (!) 112 (!) 109  Resp: 18  20   Temp: 97.9 F (36.6 C)  98.8 F (37.1 C)   TempSrc: Oral  Oral   SpO2: 98% 98% 92%   Weight:      Height:        Intake/Output Summary (Last 24 hours) at 11/30/2021 1353 Last data filed at 11/30/2021 1221 Gross per 24 hour  Intake 240 ml  Output 3100 ml  Net -2860 ml    Filed Weights   11/28/21 0403 11/29/21 0320 11/30/21 0500  Weight: 71 kg 70.9 kg 80 kg    Examination:NG tube in place Tolerating tube feeds Rectal tube with liquid stool  General exam: Appears chronically ill-appearing  respiratory system: Rhonchi to auscultation. Respiratory effort normal. Cardiovascular system: S1 & S2 heard, RRR. No JVD, murmurs, rubs, gallops or clicks. No pedal edema. Gastrointestinal system: Abdomen is nondistended, soft and nontender. No organomegaly or masses felt. Normal bowel sounds heard. Central nervous system: Alert and oriented. No focal neurological deficits. Extremities: Trace edema  skin: No rashes, lesions or ulcers Psychiatry: unable to assess  Data Reviewed: I have personally reviewed following labs and imaging studies  CBC: Recent Labs  Lab 11/24/21 0240 11/25/21 0312 11/26/21 0333 11/27/21 0157  WBC 18.3* 24.5* 9.8 9.2  NEUTROABS 16.8* 21.6* 8.0*  --   HGB 12.9 12.3 11.6* 11.1*  HCT 39.3 38.4 35.7* 33.8*  MCV 105.1* 104.6* 103.5* 101.2*  PLT 157 248 145* 144*    Basic Metabolic Panel: Recent Labs  Lab 11/26/21 0333 11/26/21 1700 11/27/21 0157 11/27/21 1700 11/27/21 2033 11/28/21 0400 11/29/21 0317 11/29/21 0735 11/29/21 1655  NA 143  --  141  --  141 138  --  137   --   K 3.5  --  2.7*  --  4.0 3.9  --  4.4  --   CL 99  --  97*  --  101 99  --  96*  --   CO2 31  --  35*  --  35* 34*  --  35*  --   GLUCOSE 85  --  107*  --  136* 141*  --  82  --   BUN 10  --  8  --  9 10  --  9  --   CREATININE 0.58  --  0.44  --  0.55 0.56  --  0.46  --   CALCIUM 7.9*  --  7.8*  --  8.4* 8.2*  --  8.0*  --   MG  --    < > 1.3* 2.2  --  2.2 1.6*  --  2.4  PHOS  --    < > 2.2* 2.3*  --  2.5 2.1*  --  4.9*   < > = values in this interval not displayed.    GFR: Estimated Creatinine Clearance: 72 mL/min (by C-G formula based on SCr of 0.46 mg/dL). Liver Function Tests: Recent Labs  Lab 11/25/21 4401 11/25/21  0552  AST QUANTITY NOT SUFFICIENT, UNABLE TO PERFORM TEST 31  ALT 37  --   ALKPHOS 132*  --   BILITOT 1.0  --   PROT 5.2*  --   ALBUMIN 1.9*  --     No results for input(s): "LIPASE", "AMYLASE" in the last 168 hours.  Recent Labs  Lab 11/25/21 0040  AMMONIA 14    Coagulation Profile: No results for input(s): "INR", "PROTIME" in the last 168 hours.  Cardiac Enzymes: No results for input(s): "CKTOTAL", "CKMB", "CKMBINDEX", "TROPONINI" in the last 168 hours.  BNP (last 3 results) No results for input(s): "PROBNP" in the last 8760 hours. HbA1C: No results for input(s): "HGBA1C" in the last 72 hours. CBG: Recent Labs  Lab 11/29/21 2001 11/30/21 0018 11/30/21 0410 11/30/21 0751 11/30/21 1218  GLUCAP 78 70 70 89 89    Lipid Profile: No results for input(s): "CHOL", "HDL", "LDLCALC", "TRIG", "CHOLHDL", "LDLDIRECT" in the last 72 hours. Thyroid Function Tests: Recent Labs    11/27/21 2032  TSH 6.711*  FREET4 0.83    Anemia Panel: Recent Labs    11/27/21 2032  VITAMINB12 1,248*    Sepsis Labs: No results for input(s): "PROCALCITON", "LATICACIDVEN" in the last 168 hours.   Recent Results (from the past 240 hour(s))  Culture, blood (Routine x 2)     Status: None   Collection Time: 11/20/21  8:58 PM   Specimen: BLOOD  Result  Value Ref Range Status   Specimen Description   Final    BLOOD BLOOD RIGHT HAND Performed at [facility] [address] [place], [place] [address]    Special Requests   Final    BOTTLES DRAWN AEROBIC AND ANAEROBIC Blood Culture results may not be optimal due to an inadequate volume of blood received in culture bottles Performed at [facility] [address] [place], [place] [address]    Culture   Final    NO GROWTH 5 DAYS Performed at [facility], [place] [address] [place], [place] [address]    Report Status 11/26/2021 FINAL  Final  Culture, blood (Routine x 2)     Status: None   Collection Time: 11/20/21  9:03 PM   Specimen: BLOOD  Result Value Ref Range Status   Specimen Description   Final    BLOOD BLOOD LEFT HAND Performed at [facility] [address], [place], [place] [address]    Special Requests   Final    BOTTLES DRAWN AEROBIC AND ANAEROBIC Blood Culture adequate volume Performed at [place] [address] [place], [place] [address]    Culture   Final    NO GROWTH 5 DAYS Performed at [facility], [place] [address] [place], [place] [address]    Report Status 11/26/2021 FINAL  Final  Urine Culture     Status: Abnormal   Collection Time: 11/21/21  8:48 AM   Specimen: Urine, Clean Catch  Result Value Ref Range Status   Specimen Description   Final    URINE, CLEAN CATCH Performed at [facility], [place] [address] [place], [place] [address]    Special Requests   Final    NONE Performed at [name], [place] [address], [place], [place] [address]    Culture MULTIPLE SPECIES PRESENT, SUGGEST RECOLLECTION (A)  Final   Report Status 11/22/2021 FINAL  Final  MRSA Next Gen by PCR, Nasal     Status:  None   Collection Time: 11/22/21  9:38 AM   Specimen: Nasal Mucosa; Nasal Swab  Result Value Ref Range Status   MRSA by PCR Next Gen NOT  DETECTED NOT DETECTED Final    Comment: (NOTE) The GeneXpert MRSA Assay (FDA approved for NASAL specimens only), is one component of a comprehensive MRSA colonization surveillance program. It is not intended to diagnose MRSA infection nor to guide or monitor treatment for MRSA infections. Test performance is not FDA approved in patients less than 69 years old. Performed at [facility], [place] [address] [place] [address], [place], [place] [address]   Respiratory (~20 pathogens) panel by PCR     Status: None   Collection Time: 11/23/21  9:16 AM   Specimen: Nasopharyngeal Swab; Respiratory  Result Value Ref Range Status   Adenovirus NOT DETECTED NOT DETECTED Final   Coronavirus 229E NOT DETECTED NOT DETECTED Final    Comment: (NOTE) The Coronavirus on the Respiratory Panel, DOES NOT test for the novel  Coronavirus (2019 nCoV)    Coronavirus HKU1 NOT DETECTED NOT DETECTED Final   Coronavirus NL63 NOT DETECTED NOT DETECTED Final   Coronavirus OC43 NOT DETECTED NOT DETECTED Final   Metapneumovirus NOT DETECTED NOT DETECTED Final   Rhinovirus / Enterovirus NOT DETECTED NOT DETECTED Final   Influenza A NOT DETECTED NOT DETECTED Final   Influenza B NOT DETECTED NOT DETECTED Final   Parainfluenza Virus 1 NOT DETECTED NOT DETECTED Final   Parainfluenza Virus 2 NOT DETECTED NOT DETECTED Final   Parainfluenza Virus 3 NOT DETECTED NOT DETECTED Final   Parainfluenza Virus 4 NOT DETECTED NOT DETECTED Final   Respiratory Syncytial Virus NOT DETECTED NOT DETECTED Final   Bordetella pertussis NOT DETECTED NOT DETECTED Final   Bordetella Parapertussis NOT DETECTED NOT DETECTED Final   Chlamydophila pneumoniae NOT DETECTED NOT DETECTED Final   Mycoplasma pneumoniae NOT DETECTED NOT DETECTED Final    Comment: Performed at [facility], [place]. [address], [place], [place] [address]  Culture, blood (Routine X 2) w Reflex to ID Panel     Status: None (Preliminary result)   Collection Time: 11/25/21  11:16 PM   Specimen: BLOOD  Result Value Ref Range Status   Specimen Description   Final    BLOOD LEFT ANTECUBITAL Performed at [facility] [address] [place], [place] [address]    Special Requests   Final    BOTTLES DRAWN AEROBIC ONLY Blood Culture adequate volume Performed at [facility] [address] [place], [place] [address]    Culture   Final    NO GROWTH 4 DAYS Performed at [facility], [place] [address], [place], [place] [address]    Report Status PENDING  Incomplete  Culture, blood (Routine X 2) w Reflex to ID Panel     Status: None (Preliminary result)   Collection Time: 11/25/21 11:16 PM   Specimen: BLOOD  Result Value Ref Range Status   Specimen Description   Final    BLOOD RIGHT ANTECUBITAL Performed at [place] [facility] [address], [place], [place] [address]    Special Requests   Final    BOTTLES DRAWN AEROBIC ONLY Blood Culture results may not be optimal due to an inadequate volume of blood received in culture bottles Performed at [facility] [address], [place], [place] [address]    Culture   Final    NO GROWTH 4 DAYS Performed at [facility],  [address], [place], [place] [address]    Report Status PENDING  Incomplete         Radiology Studies: No results found.      Scheduled Meds:  arformoterol  15 mcg Nebulization BID   busPIRone  10 mg Oral TID   Chlorhexidine Gluconate Cloth  6 each Topical Daily   folic acid  1 mg Oral Daily   furosemide  40 mg Intravenous Daily   gabapentin  600 mg Oral TID   guaiFENesin  15 mL Oral Q6H   heparin  5,000 Units Subcutaneous Q8H   multivitamin with minerals  1 tablet Oral Daily   nicotine  21 mg Transdermal Daily   nortriptyline  50 mg Oral QHS   pantoprazole  40 mg Oral BID   potassium chloride  40 mEq Oral Daily   revefenacin  175 mcg Nebulization Daily   sodium chloride flush  10-40 mL  Intracatheter Q12H   Continuous Infusions:  sodium chloride Stopped (11/25/21 0135)   thiamine injection 500 mg (11/30/21 1319)     LOS: 8 days    Time spent:38 min  Georgette Shell, MD 11/30/2021, 1:53 PM

## 2021-11-30 NOTE — TOC Progression Note (Signed)
Transition of Care [facility]) - Progression Note    Patient Details  Name: DURA MCCORMACK MRN: [id number] Date of Birth: 11-27-1955  Transition of Care [facility]) CM/SW [place], [place] Phone Number: 11/30/2021, 2:07 PM  Clinical Narrative:    Pt is recommended for SNF placement. Currently awaiting PASRR number for SNF placement.    Expected Discharge Plan: [place] Barriers to Discharge: Continued Medical Work up  Expected Discharge Plan and Services Expected Discharge Plan: [place] In-house Referral: Clinical Social Work     Living arrangements for the past 2 months: Single Family Home                 DME Arranged: N/A DME Agency: NA                   Social Determinants of Health (SDOH) Interventions    Readmission Risk Interventions    11/23/2021    2:58 PM  Readmission Risk Prevention Plan  Medication Review (RN Care Manager) Complete  HRI or Home Care Consult Complete  SW Recovery Care/Counseling Consult Complete  Palliative Care Screening Not Applicable  Skilled Nursing Facility Not Complete  SNF Comments Patient's orientation is fluctuating and patient was in mittens in last 24 hours.

## 2021-12-01 LAB — CULTURE, BLOOD (ROUTINE X 2)
Culture: NO GROWTH
Culture: NO GROWTH
Special Requests: ADEQUATE

## 2021-12-01 LAB — ANCA TITERS
Atypical P-ANCA titer: <1:20 {titer}
C-ANCA: <1:20 {titer}
P-ANCA: <1:20 {titer}

## 2021-12-01 LAB — GLUCOSE, CAPILLARY
Glucose-Capillary: 82 mg/dL (ref 70–99)
Glucose-Capillary: 103 mg/dL — ABNORMAL HIGH (ref 70–99)

## 2021-12-01 MED ORDER — HEPARIN SOD (PORK) LOCK FLUSH 100 UNIT/ML IV SOLN: 500.0000 [IU] | INTRAVENOUS | Status: DC | PRN

## 2021-12-01 MED ORDER — ONDANSETRON HCL 4 MG PO TABS: 4.0000 mg | ORAL_TABLET | Freq: Four times a day (QID) | ORAL | 0 refills | Status: DC | PRN

## 2021-12-01 MED ORDER — NICOTINE 21 MG/24HR TD PT24: 21.0000 mg | MEDICATED_PATCH | Freq: Every day | TRANSDERMAL | 0 refills | Status: DC

## 2021-12-01 MED ORDER — MELATONIN 5 MG PO TABS
5.0000 mg | ORAL_TABLET | Freq: Once | ORAL | Status: AC
  Administered 2021-12-01: 5 mg via ORAL
  Filled 2021-12-01: qty 1

## 2021-12-01 MED ORDER — FOLIC ACID 1 MG PO TABS: 1.0000 mg | ORAL_TABLET | Freq: Every day | ORAL | Status: DC

## 2021-12-01 NOTE — Discharge Summary (Signed)
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  3 days prior to this patient had a fall. CT chest concerning for groundglass nodular opacities patient placed on antibiotics. On 11/22/2021 rapid response was called due to hypoxemia and increased work of breathing and increased oxygen demand to 15 L patient was transferred to stepdown unit. CT angiogram of the chest did not show pulmonary embolism but showed extensive patchy groundglass infiltrates in both upper lobes suggestive of multifocal pneumonia. Abdominal x-ray with dilated small bowel loops of severe diffuse adynamic ileus. 11/25/2021-she had altered mental status overnight.  Patient was on Ativan oxycodone and Seroquel and Precedex.  Seroquel has been stopped.  Potassium was 5.3 potassium replacement was stopped, patient is on Lasix every 8.  She had watery loose stools and Flexi-Seal was placed. Blood pressure remains soft hypotensive 85/53 she was started on phenylephrine IV infusion through the peripheral IV and IV fluid bolus was given. NG tube was placed for ileus. CT head showed no acute intracranial pathology.  Bifrontal atrophy and chronic microvascular ischemic changes. Ammonia level was normal. Echo with EF 60 to 65% no regional wall motion  abnormalities.  Grade 1 diastolic dysfunction.   11/28/2021-patient is more awake than yesterday and is able to follow commands and answer questions appropriately and talking appropriately.  Discharge Diagnoses:  Principal Problem:   Right rib fracture Active Problems:   Elevated LFTs   Tobacco abuse   Alcohol use   Paroxysmal atrial fibrillation (HCC)   Chronic obstructive pulmonary disease, group B, by Global Initiative for Chronic Obstructive Lung Disease 2017 classification ([place])   Hypokalemia   Community acquired pneumonia   Aspiration pneumonia ([place])   Acute respiratory failure with hypoxia (HCC)   Encephalopathy acute    #1 acute hypoxic respiratory failure secondary to multifocal pneumonia likely from aspiration versus CAP She was initially treated with Rocephin and azithromycin and then changed to Zosyn due to persisting worsening bilateral infiltrates and increasing oxygen demand.  Her leukocytosis improved with Zosyn oxygen demand improved.  She was on room air on the day of discharge. MRSA PCR negative.   #2 adynamic ileus resolved she was able to tolerate a diet prior to discharge #3 acute metabolic encephalopathy secondary to #1 resolved   #4 chronic alcohol abuse treated with CIWA protocol thiamine and folate.  She also received high-dose thiamine for 7 days.  #5 paroxysmal atrial fibrillation not on any anticoagulation due to heavy alcohol abuse and risk of fall.   #6 vertebral compression fractures supportive treatment   #7 multiple electrolyte abnormalities hypokalemia, hypomagnesemia, hypophosphatemia repleted.    Nutrition Problem: Inadequate oral intake Etiology: inability to eat    Signs/Symptoms: NPO status     Interventions: Tube feeding, Prostat  Estimated body mass index is 31.24 kg/m as calculated from the following:   Height as of this encounter: '5\' 3"'$  (1.6 m).  Weight as of this encounter: 80 kg.  Discharge Instructions  Discharge  Instructions     Diet - low sodium heart healthy   Complete by: As directed    Increase activity slowly   Complete by: As directed    No wound care   Complete by: As directed       Allergies as of 12/01/2021       Reactions   Capsaicin-menthol Other (See Comments)   Burning and peeling on area applied to   Diclo Gel [diclofenac Sodium] Other (See Comments)   GEL AND CREAM-burning and peeling at the sight of application.        Medication List     STOP taking these medications    metoCLOPramide 10 MG tablet Commonly known as: REGLAN   ondansetron 8 MG disintegrating tablet Commonly known as: ZOFRAN-ODT   promethazine 12.5 MG suppository Commonly known as: PHENERGAN   QUEtiapine 100 MG tablet Commonly known as: SEROQUEL       TAKE these medications    acetaminophen 650 MG CR tablet Commonly known as: TYLENOL Take 1,950 mg by mouth 2 (two) times daily as needed for pain.   albuterol 108 (90 Base) MCG/ACT inhaler Commonly known as: VENTOLIN HFA Inhale 2 puffs into the lungs every 6 (six) hours as needed for wheezing or shortness of breath.   Anoro Ellipta 62.5-25 MCG/ACT Aepb Generic drug: umeclidinium-vilanterol INHALE 1 PUFF BY MOUTH EVERY DAY What changed: See the new instructions.   busPIRone 10 MG tablet Commonly known as: BUSPAR TAKE 1 TABLET BY MOUTH THREE TIMES A DAY   CALCIUM-MAGNESIUM-ZINC PO Take 1 tablet by mouth daily.   CENTRUM SILVER 50+WOMEN PO Take 1 tablet by mouth daily.   famotidine 20 MG tablet Commonly known as: PEPCID Take 20 mg by mouth 2 (two) times daily as needed for heartburn or indigestion.   feeding supplement Liqd Take 237 mLs by mouth 2 (two) times daily between meals. What changed:  when to take this reasons to take this   folic acid 1 MG tablet Commonly known as: FOLVITE Take 1 tablet (1 mg total) by mouth daily. Start taking on: December 02, 2021   gabapentin 600 MG tablet Commonly known as: NEURONTIN Take 1  tablet (600 mg total) by mouth 3 (three) times daily.   nicotine 21 mg/24hr patch Commonly known as: NICODERM CQ - dosed in mg/24 hours Place 1 patch (21 mg total) onto the skin daily. Start taking on: December 02, 2021   nortriptyline 25 MG capsule Commonly known as: PAMELOR TAKE 2 CAPSULES (50 MG TOTAL) BY MOUTH EVERY DAY AT BEDTIME What changed: See the new instructions.   ondansetron 4 MG tablet Commonly known as: ZOFRAN Take 1 tablet (4 mg total) by mouth every 6 (six) hours as needed for nausea.   OXYGEN Inhale into the lungs as needed.   pantoprazole 40 MG tablet Commonly known as: PROTONIX Take 1 tablet (40 mg total) by mouth every morning.   VITAMIN D3 PO Take 1 capsule by mouth daily.        Allergies  Allergen Reactions   Capsaicin-Menthol Other (See Comments)    Burning and peeling on area applied to   Diclo Gel [Diclofenac Sodium] Other (See Comments)    GEL AND CREAM-burning and peeling at the sight of application.    Consultations: pccm   Procedures/Studies: DG CHEST PORT 1 VIEW  Result Date: 11/27/2021 CLINICAL DATA:  NG placement. EXAM: PORTABLE CHEST 1 VIEW COMPARISON:  Chest radiograph dated 11/27/2021. FINDINGS: Enteric tube extends below the diaphragm with side-port in the left upper abdomen and tip beyond the inferior margin of the image. Right-sided PICC in similar position. Bilateral opacities similar to prior radiograph. No pleural effusion or pneumothorax. The cardiac silhouette is within limits. No acute osseous pathology. IMPRESSION: 1. Enteric tube with tip beyond the inferior margin of the image. 2. Bilateral pulmonary opacities similar to prior radiograph. Electronically Signed   By: Anner Crete M.D.   On: 11/27/2021 22:07   DG CHEST PORT 1 VIEW  Result Date: 11/27/2021 CLINICAL DATA:  Respiratory failure. EXAM: PORTABLE CHEST 1 VIEW COMPARISON:  11/24/2021. FINDINGS: 4:32 a.m., 11/27/2021. Enteric tube is well inside the stomach but  tip projected in the distal body of stomach with right PICC tip at the superior cavoatrial junction. The patient is rotated to the left today. There is a low inspiration. Interstitial and widespread patchy opacities of the lung fields continue to be seen. There is slight improvement in aeration of the right base but otherwise no changes in the overall aeration and small right pleural effusion. The mediastinum is normally outlined.  No acute osseous findings. IMPRESSION: Slightly improved right basilar aeration. No other interval change in the bilateral lung opacities. Electronically Signed   By: Telford Nab M.D.   On: 11/27/2021 07:29   EEG adult  Result Date: 11/26/2021 Lora Havens, MD     11/26/2021 12:23 PM Patient Name: HARGUN SPURLING MRN: [id number] Epilepsy Attending: Lora Havens Referring Physician/Provider: Brand Males, MD Date: 11/26/2021 Duration: 22.24 mins Patient history: 66 year old female with altered mental status.  EEG to evaluate for seizure. Level of alertness: Awake AEDs during EEG study: GBP, Ativan Technical aspects: This EEG study was done with scalp electrodes positioned according to the 10-20 International system of electrode placement. Electrical activity was acquired at a sampling rate of '500Hz'$  and reviewed with a high frequency filter of '70Hz'$  and a low frequency filter of '1Hz'$ . EEG data were recorded continuously and digitally stored. Description: The posterior dominant rhythm consists of 8 Hz activity of moderate voltage (25-35 uV) seen predominantly in posterior head regions, symmetric and reactive to eye opening and eye closing.  EEG showed intermittent generalized 5 to 7 Hz theta slowing. Hyperventilation and photic stimulation were not performed.   ABNORMALITY - Intermittent slow, generalized IMPRESSION: This study is suggestive of mild diffuse encephalopathy, nonspecific etiology. No seizures or epileptiform discharges were seen throughout the recording.  Priyanka O Yadav   [place] EKG SITE RITE  Result Date: 11/26/2021 If Site Rite image not attached, placement could not be confirmed due to current cardiac rhythm.  DG Abd 1 View  Result Date: 11/26/2021 CLINICAL DATA:  Nausea and vomiting. EXAM: ABDOMEN - 1 VIEW COMPARISON:  Study from yesterday at 9:34 p.m. FINDINGS: 4:30 a.m. 11/26/2021. NGT tip is in the body of stomach as before. There is a general paucity of bowel aeration. Scattered bowel aeration in the lower abdomen and pelvis is noted. There is no supine evidence of free air. No pathologic calcification. Visceral shadows are stable. IMPRESSION: 1. NGT tip in the body of stomach, unchanged. 2. General paucity of bowel aeration particularly in the abdomen. Electronically Signed   By: Telford Nab M.D.   On: 11/26/2021 07:06   DG Abd 1 View  Result Date: 11/26/2021 CLINICAL DATA:  Check NG placement. EXAM: ABDOMEN - 1 VIEW COMPARISON:  Earlier study today at 4:30 a.m. FINDINGS: 5:48 a.m., 11/26/2021. The lower  half of the true pelvis and portion of the lateral right hemiabdomen are excluded from the exam. NGT is within the stomach. The proximal side-hole is 5.3 cm below the level of the hiatus and the tip is in the body of the stomach. There is a general paucity of bowel gas. Visceral shadows are stable. There is no supine evidence of free air. Osteopenia and degenerative change dorsal spine. IMPRESSION: 1. NGT appears adequately inserted. 2. General paucity of bowel gas. Minimal small bowel aeration in right lower quadrant is noted. Electronically Signed   By: Telford Nab M.D.   On: 11/26/2021 06:26   DG Abd 1 View  Result Date: 11/25/2021 CLINICAL DATA:  NG placement. EXAM: ABDOMEN - 1 VIEW COMPARISON:  Earlier radiograph dated 11/25/2021. FINDINGS: Enteric tube with tip in the body of the stomach. No bowel dilatation. Scattered reticulonodular densities noted at the visualized lung bases. Clinical correlation is recommended. IMPRESSION: 1.  Enteric tube with tip in the body of the stomach. 2. Scattered reticulonodular densities at the lung bases. Electronically Signed   By: Anner Crete M.D.   On: 11/25/2021 22:17   DG Abd Portable 1V  Result Date: 11/25/2021 CLINICAL DATA:  Follow-up ileus in a 66 year old female. EXAM: PORTABLE ABDOMEN - 1 VIEW COMPARISON:  November 24, 2021. FINDINGS: Gastric tube remains in place, tip in the mid stomach. Rectal management tube in place, balloon inflated to approximately 6.5 cm projecting over the lower pelvis. Diminished diffuse gaseous distension of bowel loops. No signs of obstruction. Little if any dilated loops of small bowel are noted compatible with improving/resolving ileus. Potential RIGHT intrarenal calculus up to 4 mm. On limited assessment there is no acute skeletal process. IMPRESSION: 1. Improving/resolving ileus. 2. Rectal management tube with balloon inflated to 6.5 cm in the pelvis. Correlate with any pelvic discomfort with adjustment as needed. 3. Small RIGHT intrarenal calculus is suspected. These results will be called to the ordering clinician or representative by the Radiologist Assistant, and communication documented in the PACS or [employer]. Electronically Signed   By: Zetta Bills M.D.   On: 11/25/2021 10:53   CT HEAD WO CONTRAST (5MM)  Result Date: 11/25/2021 CLINICAL DATA:  Altered mental status. EXAM: CT HEAD WITHOUT CONTRAST TECHNIQUE: Contiguous axial images were obtained from the base of the skull through the vertex without intravenous contrast. RADIATION DOSE REDUCTION: This exam was performed according to the departmental dose-optimization program which includes automated exposure control, adjustment of the mA and/or kV according to patient size and/or use of iterative reconstruction technique. COMPARISON:  Head CT dated 11/20/2021. FINDINGS: Brain: Mild predominantly bifrontal atrophy. Mild chronic microvascular ischemic changes. There is no acute intracranial  hemorrhage. No mass effect or midline shift. No extra-axial fluid collection. Vascular: No hyperdense vessel or unexpected calcification. Skull: Normal. Negative for fracture or focal lesion. Sinuses/Orbits: No acute finding. Other: Nasogastric tube is partially visualized in the right nostril. IMPRESSION: 1. No acute intracranial pathology. 2. Mild predominantly bifrontal atrophy and chronic microvascular ischemic changes. Electronically Signed   By: Anner Crete M.D.   On: 11/25/2021 01:22   DG Abd 1 View  Result Date: 11/24/2021 CLINICAL DATA:  NG tube placement EXAM: ABDOMEN - 1 VIEW COMPARISON:  11/24/2021 FINDINGS: Esophageal tube tip and side port overlie the gastric fundus. Persistent moderate gaseous dilatation of the bowel. IMPRESSION: Esophageal tube tip overlies the gastric fundus. Electronically Signed   By: Donavan Foil M.D.   On: 11/24/2021 20:55   DG Abd 1  View  Result Date: 11/24/2021 CLINICAL DATA:  [id number] EXAM: ABDOMEN - 1 VIEW COMPARISON:  November 22, 2021 FINDINGS: Again seen is the NG tube with its tip at the distal gastric antrum region. There is mild dilatation of the small bowel loops and right colon seen without significant interval change. No radio-opaque calculi or other significant radiographic abnormality are seen. IMPRESSION: Mild dilatation of the small bowel loops and right colon likely on the basis of adynamic ileus without significant interval change Electronically Signed   By: Frazier Richards M.D.   On: 11/24/2021 11:13   DG CHEST PORT 1 VIEW  Result Date: 11/24/2021 CLINICAL DATA:  66 year old female with shortness of breath. EXAM: PORTABLE CHEST 1 VIEW COMPARISON:  CTA chest 11/22/2021 and earlier. FINDINGS: Portable AP semi upright view at 0539 hours. Low lung volumes. Enteric tube terminates in the stomach. Radiographically progressed patchy multifocal bilateral pulmonary opacity, characterized by CT 2 days ago and suspicious for multifocal viral/atypical  pneumonia. Small right pleural effusion at that time likely unchanged. No pneumothorax. Stable visible bowel gas pattern. No acute osseous abnormality identified. IMPRESSION: 1. Enteric tube terminates in the stomach. 2. Low lung volumes with radiographic progression of bilateral Viral/atypical Pneumonia since 11/22/2021. Small right pleural effusion seen by CT likely unchanged. Electronically Signed   By: Genevie Ann M.D.   On: 11/24/2021 08:13   VAS [place] LOWER EXTREMITY VENOUS (DVT)  Result Date: 11/23/2021  Lower Venous DVT Study Patient Name:  DRAKE LANDING  Date of Exam:   11/23/2021 Medical Rec #: [id number]         Accession #:    [id number] Date of Birth: 02-14-1956        Patient Gender: F Patient Age:   50 years Exam Location:  [facility] Procedure:      VAS [place] LOWER EXTREMITY VENOUS (DVT) Referring Phys: Larey Days --------------------------------------------------------------------------------  Indications: Swelling.  Risk Factors: None identified. Limitations: Poor ultrasound/tissue interface. Comparison Study: No prior studies. Performing Technologist: Oliver Hum RVT  Examination Guidelines: A complete evaluation includes B-mode imaging, spectral Doppler, color Doppler, and power Doppler as needed of all accessible portions of each vessel. Bilateral testing is considered an integral part of a complete examination. Limited examinations for reoccurring indications may be performed as noted. The reflux portion of the exam is performed with the patient in reverse Trendelenburg.  +---------+---------------+---------+-----------+----------+--------------+ RIGHT    CompressibilityPhasicitySpontaneityPropertiesThrombus Aging +---------+---------------+---------+-----------+----------+--------------+ CFV      Full           Yes      Yes                                 +---------+---------------+---------+-----------+----------+--------------+ SFJ      Full                                                         +---------+---------------+---------+-----------+----------+--------------+ FV Prox  Full                                                        +---------+---------------+---------+-----------+----------+--------------+ FV Mid  Full                                                        +---------+---------------+---------+-----------+----------+--------------+ FV DistalFull                                                        +---------+---------------+---------+-----------+----------+--------------+ PFV      Full                                                        +---------+---------------+---------+-----------+----------+--------------+ POP      Full           Yes      Yes                                 +---------+---------------+---------+-----------+----------+--------------+ PTV      Full                                                        +---------+---------------+---------+-----------+----------+--------------+ PERO     Full                                                        +---------+---------------+---------+-----------+----------+--------------+   +---------+---------------+---------+-----------+----------+--------------+ LEFT     CompressibilityPhasicitySpontaneityPropertiesThrombus Aging +---------+---------------+---------+-----------+----------+--------------+ CFV      Full           Yes      Yes                                 +---------+---------------+---------+-----------+----------+--------------+ SFJ      Full                                                        +---------+---------------+---------+-----------+----------+--------------+ FV Prox  Full                                                        +---------+---------------+---------+-----------+----------+--------------+ FV Mid   Full                                                         +---------+---------------+---------+-----------+----------+--------------+  FV DistalFull                                                        +---------+---------------+---------+-----------+----------+--------------+ PFV      Full                                                        +---------+---------------+---------+-----------+----------+--------------+ POP      Full           Yes      Yes                                 +---------+---------------+---------+-----------+----------+--------------+ PTV      Full                                                        +---------+---------------+---------+-----------+----------+--------------+ PERO     Full                                                        +---------+---------------+---------+-----------+----------+--------------+     Summary: RIGHT: - There is no evidence of deep vein thrombosis in the lower extremity.  - No cystic structure found in the popliteal fossa.  LEFT: - There is no evidence of deep vein thrombosis in the lower extremity.  - No cystic structure found in the popliteal fossa.  *See table(s) above for measurements and observations. Electronically signed by Servando Snare MD on 11/23/2021 at 6:04:08 PM.    Final    ECHOCARDIOGRAM COMPLETE  Result Date: 11/23/2021    ECHOCARDIOGRAM REPORT   Patient Name:   TYRESE FICEK Date of Exam: 11/23/2021 Medical Rec #:  [id number]        Height:       63.0 in Accession #:    [id number]       Weight:       165.1 lb Date of Birth:  02-Jul-1955       BSA:          1.782 m Patient Age:    82 years         BP:           127/89 mmHg Patient Gender: F                HR:           109 bpm. Exam Location:  Inpatient Procedure: 2D Echo, Color Doppler and Cardiac Doppler Indications:     Atrial flutter  History:         Patient has prior history of Echocardiogram examinations.                  Arrythmias:Atrial Fibrillation; Risk Factors:Hypertension.  Sonographer:      Jyl Heinz Referring Phys:  [id number] Otilio Carpen GLEASON Diagnosing Phys: [place] IMPRESSIONS  1. Left ventricular ejection fraction, by estimation, is 60 to 65%. The left ventricle has normal function. The left ventricle has no regional wall motion abnormalities. There is mild left ventricular hypertrophy. Left ventricular diastolic parameters are consistent with Grade I diastolic dysfunction (impaired relaxation).  2. Right ventricular systolic function is normal. The right ventricular size is normal.  3. The mitral valve is grossly normal. No evidence of mitral valve regurgitation.  4. The aortic valve is tricuspid. Aortic valve regurgitation is not visualized.  5. Aortic no significant ascending aortic aneurysm.  6. IVC was not well visualized. Comparison(s): No significant change from prior study. FINDINGS  Left Ventricle: Left ventricular ejection fraction, by estimation, is 60 to 65%. The left ventricle has normal function. The left ventricle has no regional wall motion abnormalities. The left ventricular internal cavity size was normal in size. There is  mild left ventricular hypertrophy. Left ventricular diastolic parameters are consistent with Grade I diastolic dysfunction (impaired relaxation). Right Ventricle: The right ventricular size is normal. Right ventricular systolic function is normal. Left Atrium: Left atrial size was normal in size. Right Atrium: Right atrial size was normal in size. Pericardium: There is no evidence of pericardial effusion. Mitral Valve: The mitral valve is grossly normal. No evidence of mitral valve regurgitation. Tricuspid Valve: The tricuspid valve is normal in structure. Tricuspid valve regurgitation is trivial. Aortic Valve: The aortic valve is tricuspid. Aortic valve regurgitation is not visualized. Aortic valve peak gradient measures 7.3 mmHg. Pulmonic Valve: The pulmonic valve was grossly normal. Pulmonic valve regurgitation is not visualized. Aorta: No significant  ascending aortic aneurysm. Venous: IVC was not well visualized. IAS/Shunts: No atrial level shunt detected by color flow Doppler.  LEFT VENTRICLE PLAX 2D LVIDd:         4.20 cm     Diastology LVIDs:         2.80 cm     LV e' medial:    5.87 cm/s LV PW:         1.10 cm     LV E/e' medial:  10.4 LV IVS:        1.00 cm     LV e' lateral:   7.18 cm/s LVOT diam:     2.00 cm     LV E/e' lateral: 8.5 LV SV:         44 LV SV Index:   25 LVOT Area:     3.14 cm  LV Volumes (MOD) LV vol d, MOD A2C: 80.7 ml LV vol d, MOD A4C: 79.1 ml LV vol s, MOD A2C: 29.9 ml LV vol s, MOD A4C: 29.4 ml LV SV MOD A2C:     50.8 ml LV SV MOD A4C:     79.1 ml LV SV MOD BP:      49.7 ml RIGHT VENTRICLE            IVC RV Basal diam:  2.10 cm    IVC diam: 1.40 cm RV Mid diam:    1.50 cm RV S prime:     8.81 cm/s TAPSE (M-mode): 1.3 cm LEFT ATRIUM             Index        RIGHT ATRIUM          Index LA diam:        3.20 cm 1.80 cm/m   RA Area:     6.83 cm LA Vol (  A2C):   34.3 ml 19.24 ml/m  RA Volume:   9.72 ml  5.45 ml/m LA Vol (A4C):   24.7 ml 13.86 ml/m LA Biplane Vol: 31.1 ml 17.45 ml/m  AORTIC VALVE AV Area (Vmax): 2.42 cm AV Vmax:        135.00 cm/s AV Peak Grad:   7.3 mmHg LVOT Vmax:      104.00 cm/s LVOT Vmean:     71.800 cm/s LVOT VTI:       0.141 m  AORTA Ao Root diam: 3.40 cm Ao Asc diam:  3.10 cm MITRAL VALVE               TRICUSPID VALVE MV Area (PHT): 3.66 cm    TR Peak grad:   22.3 mmHg MV Decel Time: 207 msec    TR Vmax:        236.00 cm/s MV E velocity: 61.30 cm/s MV A velocity: 74.10 cm/s  SHUNTS MV E/A ratio:  0.83        Systemic VTI:  0.14 m                            Systemic Diam: 2.00 cm Phineas Inches Electronically signed by Phineas Inches Signature Date/Time: 11/23/2021/1:12:01 PM    Final (Updated)    CT Angio Chest Pulmonary Embolism (PE) W or WO Contrast  Result Date: 11/22/2021 CLINICAL DATA:  Hypoxia EXAM: CT ANGIOGRAPHY CHEST WITH CONTRAST TECHNIQUE: Multidetector CT imaging of the chest was performed using the  standard protocol during bolus administration of intravenous contrast. Multiplanar CT image reconstructions and MIPs were obtained to evaluate the vascular anatomy. RADIATION DOSE REDUCTION: This exam was performed according to the departmental dose-optimization program which includes automated exposure control, adjustment of the mA and/or kV according to patient size and/or use of iterative reconstruction technique. CONTRAST:  11m OMNIPAQUE IOHEXOL 350 MG/ML SOLN COMPARISON:  Previous studies including CT done on 11/20/2021 and radiographs done earlier today FINDINGS: Cardiovascular: There is homogeneous enhancement in thoracic aorta. There are no intraluminal filling defects in the central pulmonary artery branches. Evaluation of small peripheral branches is limited by infiltrates in both lungs. Mediastinum/Nodes: No significant lymphadenopathy seen. Enteric tube is noted in the thoracic esophagus. Lungs/Pleura: Extensive patchy ground-glass infiltrates are seen in the both upper lobes with significant interval worsening. There are linear and patchy infiltrates in the right middle lobe and both lower lobes. There is interval appearance of small to moderate right pleural effusion. There is minimal left pleural effusion. There is no pneumothorax. Upper Abdomen: There is fatty infiltration in the liver. There is increase in amount of perihepatic fluid along the anterior and lateral aspects of right lobe. Distal portion of enteric tube is seen in the stomach. Musculoskeletal: There is decrease in height of bodies of T2, T3 T5 and T6 vertebrae more severe at T6 level. There is retropulsion of posterior margin of T6 vertebral body causing spinal stenosis. Similar finding was seen in the previous studies including the examination of 06/21/2021. Degenerative changes are noted in the visualized lower cervical spine with encroachment of neural foramina by bony spurs. Review of the MIP images confirms the above findings.  IMPRESSION: There is no evidence of pulmonary artery embolism. There is no evidence of thoracic aortic dissection. Extensive patchy ground-glass infiltrates are seen in the both upper lobes with significant interval worsening. Findings suggest multifocal pneumonia, possibly atypical viral pneumonia. There are linear and patchy infiltrates in the right  middle lobe and both lower lobes suggesting atelectasis/pneumonia. Small to moderate right pleural effusion. Minimal left pleural effusion. Fatty liver. There is fluid along the anterior and lateral margins of liver with significant interval increase. This may be part of ascites or suggest inflammatory process in the liver. Multiple compression fractures are seen in the thoracic vertebrae, particularly severe in the T6 vertebra with retropulsion of the posterior margin and spinal stenosis. This finding appears stable. Other findings as described in the body of the report. Electronically Signed   By: Elmer Picker M.D.   On: 11/22/2021 15:21   DG Abd 1 View  Result Date: 11/22/2021 CLINICAL DATA:  NG tube placement. EXAM: ABDOMEN - 1 VIEW COMPARISON:  Abdominal radiograph, 11/22/2021 at 8:30 a.m. FINDINGS: Nasal/orogastric tube passes below the diaphragm, tip curling in the gastric fundus. Diffuse increase in bowel gas with mild bowel dilation as noted on the earlier exam. IMPRESSION: 1. Well-positioned nasal/orogastric tube. Electronically Signed   By: Lajean Manes M.D.   On: 11/22/2021 10:51   DG Abd 1 View  Result Date: 11/22/2021 CLINICAL DATA:  Vomiting, shortness of breath and coughing. EXAM: ABDOMEN - 1 VIEW COMPARISON:  09/09/2020.  CT, 05/22/2021. FINDINGS: Diffuse increased bowel gas, with mild dilation of small bowel and of the right colon. Residual contrast bladder from the CT performed on 11/20/2021. Soft tissues are poorly defined, mostly obscured by overlying bowel gas. No acute skeletal abnormality. IMPRESSION: 1. Diffuse increase in bowel  gas with mild right colon and small bowel dilation. Findings support a diffuse adynamic ileus. Electronically Signed   By: Lajean Manes M.D.   On: 11/22/2021 10:50   DG CHEST PORT 1 VIEW  Result Date: 11/22/2021 CLINICAL DATA:  Shortness of breath, cough and vomiting. EXAM: PORTABLE CHEST 1 VIEW COMPARISON:  11/20/2021 FINDINGS: Stable cardiomediastinal contours. Low lung volumes. Atelectasis is again noted within the lingula. Patchy opacities within the right upper lobe and right midlung are noted corresponding to multifocal ground-glass opacities noted on recent chest CT. No signs of pleural effusion or interstitial edema. Dilated small bowel loops are noted within the visualized portions of the upper abdomen. These measure up to 4.9 cm. IMPRESSION: 1. Continued lingular atelectasis and patchy opacities within the right lung similar to chest CT from 11/20/2021 2. Dilated small bowel loops noted within the visualized portions of the upper abdomen which may reflect underlying small bowel obstruction versus ileus. Clinical correlation advised. Electronically Signed   By: Kerby Moors M.D.   On: 11/22/2021 09:19   CT ABDOMEN PELVIS W CONTRAST  Result Date: 11/20/2021 CLINICAL DATA:  Trauma and fall.  Right flank bruising. EXAM: CT CHEST, ABDOMEN, AND PELVIS WITH CONTRAST TECHNIQUE: Multidetector CT imaging of the chest, abdomen and pelvis was performed following the standard protocol during bolus administration of intravenous contrast. RADIATION DOSE REDUCTION: This exam was performed according to the departmental dose-optimization program which includes automated exposure control, adjustment of the mA and/or kV according to patient size and/or use of iterative reconstruction technique. CONTRAST:  142m OMNIPAQUE IOHEXOL 300 MG/ML  SOLN COMPARISON:  Chest radiograph dated 11/20/2021 and CT dated 06/21/2021. CT abdomen pelvis dated 05/22/2021. FINDINGS: CT CHEST FINDINGS Cardiovascular: There is no  cardiomegaly or pericardial effusion. Mild atherosclerotic calcification of the aortic arch. No aneurysmal dilatation or dissection. The central pulmonary arteries are unremarkable. Mediastinum/Nodes: No hilar or mediastinal adenopathy. Mild circumferential thickened appearing esophagus may be related to underdistention. Clinical correlation is recommended to evaluate for possibility of mild esophagitis. No  mediastinal fluid collection or hematoma. Lungs/Pleura: Scattered small clusters of ground-glass subpleural densities throughout the lungs, new since the prior CT may represent atypical infection. Aspiration is less likely. Linear atelectasis in the lingula. No pleural effusion or pneumothorax. The central airways are patent. Musculoskeletal: Old compression fracture of T6 with complete loss of vertebral body height and associated 5 mm retropulsion of the posterior cortex similar to prior CT. There is focal narrowing of the central canal at this level. Old healed fracture of the sternal manubrium. Degenerative changes of spine and mild multilevel chronic compression injuries. There is acute appearing minimally displaced fracture of posterolateral right tenth rib (126/5). CT ABDOMEN PELVIS FINDINGS No intra-abdominal free air.  Trace perihepatic free fluid. Hepatobiliary: Fatty liver. No intrahepatic biliary ductal dilatation. The gallbladder is unremarkable. Pancreas: Unremarkable. No pancreatic ductal dilatation or surrounding inflammatory changes. Spleen: Normal in size without focal abnormality. Adrenals/Urinary Tract: The adrenal glands unremarkable. Subcentimeter left renal interpolar hypodense focus is too small to characterize. There is no hydronephrosis on either side. There is symmetric enhancement and excretion of contrast by both kidneys. The visualized ureters and the urinary bladder appear unremarkable. Stomach/Bowel: There is sigmoid diverticulosis without active inflammatory changes. There is no  bowel obstruction or active inflammation. Appendectomy. Vascular/Lymphatic: Moderate aortoiliac atherosclerotic disease. The IVC is unremarkable. No portal venous gas. There is no adenopathy. Reproductive: The uterus and ovaries are grossly unremarkable. No adnexal masses. Other: None Musculoskeletal: Degenerative changes of the spine. No acute osseous pathology. IMPRESSION: 1. Acute appearing minimally displaced fracture of the posterolateral right tenth rib. No pneumothorax. 2. Scattered small clusters of subpleural ground-glass densities throughout the lungs, new since the prior CT may represent atypical infection. Aspiration is less likely. 3. Fatty liver. 4. Sigmoid diverticulosis. No bowel obstruction. 5. Aortic Atherosclerosis (ICD10-I70.0). Electronically Signed   By: Anner Crete M.D.   On: 11/20/2021 23:08   CT Chest W Contrast  Result Date: 11/20/2021 CLINICAL DATA:  Trauma and fall.  Right flank bruising. EXAM: CT CHEST, ABDOMEN, AND PELVIS WITH CONTRAST TECHNIQUE: Multidetector CT imaging of the chest, abdomen and pelvis was performed following the standard protocol during bolus administration of intravenous contrast. RADIATION DOSE REDUCTION: This exam was performed according to the departmental dose-optimization program which includes automated exposure control, adjustment of the mA and/or kV according to patient size and/or use of iterative reconstruction technique. CONTRAST:  161m OMNIPAQUE IOHEXOL 300 MG/ML  SOLN COMPARISON:  Chest radiograph dated 11/20/2021 and CT dated 06/21/2021. CT abdomen pelvis dated 05/22/2021. FINDINGS: CT CHEST FINDINGS Cardiovascular: There is no cardiomegaly or pericardial effusion. Mild atherosclerotic calcification of the aortic arch. No aneurysmal dilatation or dissection. The central pulmonary arteries are unremarkable. Mediastinum/Nodes: No hilar or mediastinal adenopathy. Mild circumferential thickened appearing esophagus may be related to underdistention.  Clinical correlation is recommended to evaluate for possibility of mild esophagitis. No mediastinal fluid collection or hematoma. Lungs/Pleura: Scattered small clusters of ground-glass subpleural densities throughout the lungs, new since the prior CT may represent atypical infection. Aspiration is less likely. Linear atelectasis in the lingula. No pleural effusion or pneumothorax. The central airways are patent. Musculoskeletal: Old compression fracture of T6 with complete loss of vertebral body height and associated 5 mm retropulsion of the posterior cortex similar to prior CT. There is focal narrowing of the central canal at this level. Old healed fracture of the sternal manubrium. Degenerative changes of spine and mild multilevel chronic compression injuries. There is acute appearing minimally displaced fracture of posterolateral right tenth rib (126/5).  CT ABDOMEN PELVIS FINDINGS No intra-abdominal free air.  Trace perihepatic free fluid. Hepatobiliary: Fatty liver. No intrahepatic biliary ductal dilatation. The gallbladder is unremarkable. Pancreas: Unremarkable. No pancreatic ductal dilatation or surrounding inflammatory changes. Spleen: Normal in size without focal abnormality. Adrenals/Urinary Tract: The adrenal glands unremarkable. Subcentimeter left renal interpolar hypodense focus is too small to characterize. There is no hydronephrosis on either side. There is symmetric enhancement and excretion of contrast by both kidneys. The visualized ureters and the urinary bladder appear unremarkable. Stomach/Bowel: There is sigmoid diverticulosis without active inflammatory changes. There is no bowel obstruction or active inflammation. Appendectomy. Vascular/Lymphatic: Moderate aortoiliac atherosclerotic disease. The IVC is unremarkable. No portal venous gas. There is no adenopathy. Reproductive: The uterus and ovaries are grossly unremarkable. No adnexal masses. Other: None Musculoskeletal: Degenerative changes  of the spine. No acute osseous pathology. IMPRESSION: 1. Acute appearing minimally displaced fracture of the posterolateral right tenth rib. No pneumothorax. 2. Scattered small clusters of subpleural ground-glass densities throughout the lungs, new since the prior CT may represent atypical infection. Aspiration is less likely. 3. Fatty liver. 4. Sigmoid diverticulosis. No bowel obstruction. 5. Aortic Atherosclerosis (ICD10-I70.0). Electronically Signed   By: Anner Crete M.D.   On: 11/20/2021 23:08   CT HEAD WO CONTRAST (5MM)  Result Date: 11/20/2021 CLINICAL DATA:  Head trauma, intracranial arterial injury suspected EXAM: CT HEAD WITHOUT CONTRAST TECHNIQUE: Contiguous axial images were obtained from the base of the skull through the vertex without intravenous contrast. RADIATION DOSE REDUCTION: This exam was performed according to the departmental dose-optimization program which includes automated exposure control, adjustment of the mA and/or kV according to patient size and/or use of iterative reconstruction technique. COMPARISON:  CT head 09/09/2020 BRAIN: BRAIN Cerebral ventricle sizes are concordant with the degree of cerebral volume loss. Trace patchy areas of decreased attenuation are noted throughout the deep and periventricular white matter of the cerebral hemispheres bilaterally, compatible with chronic microvascular ischemic disease. No evidence of large-territorial acute infarction. No parenchymal hemorrhage. No mass lesion. No extra-axial collection. No mass effect or midline shift. No hydrocephalus. Basilar cisterns are patent. Vascular: No hyperdense vessel. Skull: No acute fracture or focal lesion. Sinuses/Orbits: Paranasal sinuses and mastoid air cells are clear. The orbits are unremarkable. Other: None. IMPRESSION: No acute intracranial abnormality. Electronically Signed   By: Iven Finn M.D.   On: 11/20/2021 22:55   DG Pelvis Portable  Result Date: 11/20/2021 CLINICAL DATA:  Fall,  right hip pain EXAM: PORTABLE PELVIS 1-2 VIEWS COMPARISON:  None Available. FINDINGS: There is no evidence of pelvic fracture or diastasis. No pelvic bone lesions are seen. IMPRESSION: Negative. Electronically Signed   By: Fidela Salisbury M.D.   On: 11/20/2021 22:34   DG Chest Port 1 View  Result Date: 11/20/2021 CLINICAL DATA:  Fall, weakness EXAM: PORTABLE CHEST 1 VIEW COMPARISON:  06/21/2021 FINDINGS: Linear atelectasis or scarring within the left mid lung zone. Lungs are otherwise clear. No pneumothorax or pleural effusion. Cardiac size within normal limits. Pulmonary vascularity is normal. No acute bone abnormality. IMPRESSION: No active disease. Electronically Signed   By: Fidela Salisbury M.D.   On: 11/20/2021 22:31   (Echo, Carotid, EGD, Colonoscopy, ERCP)    Subjective: She is awake and alert anxious to go home she does not want to go to SNF  Discharge Exam: Vitals:   12/01/21 0806 12/01/21 1331  BP:  (!) 148/84  Pulse:  94  Resp:  18  Temp:  97.8 F (36.6 C)  SpO2: 93% 94%  Vitals:   12/01/21 0558 12/01/21 0600 12/01/21 0806 12/01/21 1331  BP:    (!) 148/84  Pulse:    94  Resp: '16 15  18  '$ Temp:    97.8 F (36.6 C)  TempSrc:    Oral  SpO2:   93% 94%  Weight:      Height:        General: Pt is alert, awake, not in acute distress Cardiovascular: RRR, S1/S2 +, no rubs, no gallops Respiratory: CTA bilaterally, no wheezing, no rhonchi Abdominal: Soft, NT, ND, bowel sounds + Extremities: no edema, no cyanosis    The results of significant diagnostics from this hospitalization (including imaging, microbiology, ancillary and laboratory) are listed below for reference.     Microbiology: Recent Results (from the past 240 hour(s))  MRSA Next Gen by PCR, Nasal     Status: None   Collection Time: 11/22/21  9:38 AM   Specimen: Nasal Mucosa; Nasal Swab  Result Value Ref Range Status   MRSA by PCR Next Gen NOT DETECTED NOT DETECTED Final    Comment: (NOTE) The GeneXpert  MRSA Assay (FDA approved for NASAL specimens only), is one component of a comprehensive MRSA colonization surveillance program. It is not intended to diagnose MRSA infection nor to guide or monitor treatment for MRSA infections. Test performance is not FDA approved in patients less than 41 years old. Performed at [facility], [place] [address] [place], [place] [address]   Respiratory (~20 pathogens) panel by PCR     Status: None   Collection Time: 11/23/21  9:16 AM   Specimen: Nasopharyngeal Swab; Respiratory  Result Value Ref Range Status   Adenovirus NOT DETECTED NOT DETECTED Final   Coronavirus 229E NOT DETECTED NOT DETECTED Final    Comment: (NOTE) The Coronavirus on the Respiratory Panel, DOES NOT test for the novel  Coronavirus (2019 nCoV)    Coronavirus HKU1 NOT DETECTED NOT DETECTED Final   Coronavirus NL63 NOT DETECTED NOT DETECTED Final   Coronavirus OC43 NOT DETECTED NOT DETECTED Final   Metapneumovirus NOT DETECTED NOT DETECTED Final   Rhinovirus / Enterovirus NOT DETECTED NOT DETECTED Final   Influenza A NOT DETECTED NOT DETECTED Final   Influenza B NOT DETECTED NOT DETECTED Final   Parainfluenza Virus 1 NOT DETECTED NOT DETECTED Final   Parainfluenza Virus 2 NOT DETECTED NOT DETECTED Final   Parainfluenza Virus 3 NOT DETECTED NOT DETECTED Final   Parainfluenza Virus 4 NOT DETECTED NOT DETECTED Final   Respiratory Syncytial Virus NOT DETECTED NOT DETECTED Final   Bordetella pertussis NOT DETECTED NOT DETECTED Final   Bordetella Parapertussis NOT DETECTED NOT DETECTED Final   Chlamydophila pneumoniae NOT DETECTED NOT DETECTED Final   Mycoplasma pneumoniae NOT DETECTED NOT DETECTED Final    Comment: Performed at [facility], [place]. [address], [place], [place] [address]  Culture, blood (Routine X 2) w Reflex to ID Panel     Status: None   Collection Time: 11/25/21 11:16 PM   Specimen: BLOOD  Result Value Ref Range Status   Specimen Description    Final    BLOOD LEFT ANTECUBITAL Performed at [place] [address] [place], [place] [address]    Special Requests   Final    BOTTLES DRAWN AEROBIC ONLY Blood Culture adequate volume Performed at [place] [address] [place], [place] [address]    Culture   Final    NO GROWTH 5 DAYS Performed at [facility], [place] [address] [place], [place] [address]    Report Status 12/01/2021 FINAL  Final  Culture, blood (Routine X 2) w Reflex to ID Panel     Status: None   Collection Time: 11/25/21 11:16 PM   Specimen: BLOOD  Result Value Ref Range Status   Specimen Description   Final    BLOOD RIGHT ANTECUBITAL Performed at [place] [address] [place], [place] [address]    Special Requests   Final    BOTTLES DRAWN AEROBIC ONLY Blood Culture results may not be optimal due to an inadequate volume of blood received in culture bottles Performed at [place] [address], [place], [place] [address]    Culture   Final    NO GROWTH 5 DAYS Performed at [facility], [place] [address], [place], [place] [address]    Report Status 12/01/2021 FINAL  Final     Labs: BNP (last 3 results) Recent Labs    11/24/21 0240  BNP 222.9*   Basic Metabolic Panel: Recent Labs  Lab 11/27/21 0157 11/27/21 1700 11/27/21 2033 11/28/21 0400 11/29/21 0317 11/29/21 0735 11/29/21 1655 11/30/21 2152  NA 141  --  141 138  --  137  --  137  K 2.7*  --  4.0 3.9  --  4.4  --  4.6  CL 97*  --  101 99  --  96*  --  98  CO2 35*  --  35* 34*  --  35*  --  33*  GLUCOSE 107*  --  136* 141*  --  82  --  99  BUN 8  --  9 10  --  9  --  <5*  CREATININE 0.44  --  0.55 0.56  --  0.46  --  0.64  CALCIUM 7.8*  --  8.4* 8.2*  --  8.0*  --  8.4*  MG 1.3* 2.2  --  2.2 1.6*  --  2.4 1.9  PHOS 2.2* 2.3*  --  2.5 2.1*  --  4.9* 4.0   Liver Function Tests: Recent Labs  Lab 11/25/21 0312 11/25/21 0552  11/30/21 2152  AST QUANTITY NOT SUFFICIENT, UNABLE TO PERFORM TEST 31 27  ALT 37  --  23  ALKPHOS 132*  --  101  BILITOT 1.0  --  0.5  PROT 5.2*  --  4.8*  ALBUMIN 1.9*  --  1.9*   No results for input(s): "LIPASE", "AMYLASE" in the last 168 hours. Recent Labs  Lab 11/25/21 0040  AMMONIA 14   CBC: Recent Labs  Lab 11/25/21 0312 11/26/21 0333 11/27/21 0157 11/30/21 2152  WBC 24.5* 9.8 9.2 8.6  NEUTROABS 21.6* 8.0*  --   --   HGB 12.3 11.6* 11.1* 10.0*  HCT 38.4 35.7* 33.8* 30.1*  MCV 104.6* 103.5* 101.2* 100.7*  PLT 248 145* 144* 171   Cardiac Enzymes: No results for input(s): "CKTOTAL", "CKMB", "CKMBINDEX", "TROPONINI" in the last 168 hours. BNP: Invalid input(s): "POCBNP" CBG: Recent Labs  Lab 11/30/21 1623 11/30/21 2032 11/30/21 2344 12/01/21 0747 12/01/21 1143  GLUCAP 74 102* 81 82 103*   D-Dimer No results for input(s): "DDIMER" in the last 72 hours. Hgb A1c No results for input(s): "HGBA1C" in the last 72 hours. Lipid Profile No results for input(s): "CHOL", "HDL", "LDLCALC", "TRIG", "CHOLHDL", "LDLDIRECT" in the last 72 hours. Thyroid function studies No results for input(s): "TSH", "T4TOTAL", "T3FREE", "THYROIDAB" in the last 72  hours.  Invalid input(s): "FREET3" Anemia work up No results for input(s): "VITAMINB12", "FOLATE", "FERRITIN", "TIBC", "IRON", "RETICCTPCT" in the last 72 hours. Urinalysis    Component Value Date/Time   COLORURINE AMBER (A) 11/21/2021 0423   APPEARANCEUR CLOUDY (A) 11/21/2021 0423   LABSPEC >1.046 (H) 11/21/2021 0423   PHURINE 5.0 11/21/2021 0423   GLUCOSEU NEGATIVE 11/21/2021 0423   HGBUR NEGATIVE 11/21/2021 0423   BILIRUBINUR NEGATIVE 11/21/2021 0423   BILIRUBINUR negative 08/23/2017 0936   BILIRUBINUR NEG 12/22/2015 1410   KETONESUR NEGATIVE 11/21/2021 0423   PROTEINUR 30 (A) 11/21/2021 0423   UROBILINOGEN 0.2 08/23/2017 0936   UROBILINOGEN 1.0 03/09/2013 1013   NITRITE NEGATIVE 11/21/2021 0423   LEUKOCYTESUR  LARGE (A) 11/21/2021 0423   Sepsis Labs Recent Labs  Lab 11/25/21 0312 11/26/21 0333 11/27/21 0157 11/30/21 2152  WBC 24.5* 9.8 9.2 8.6   Microbiology Recent Results (from the past 240 hour(s))  MRSA Next Gen by PCR, Nasal     Status: None   Collection Time: 11/22/21  9:38 AM   Specimen: Nasal Mucosa; Nasal Swab  Result Value Ref Range Status   MRSA by PCR Next Gen NOT DETECTED NOT DETECTED Final    Comment: (NOTE) The GeneXpert MRSA Assay (FDA approved for NASAL specimens only), is one component of a comprehensive MRSA colonization surveillance program. It is not intended to diagnose MRSA infection nor to guide or monitor treatment for MRSA infections. Test performance is not FDA approved in patients less than 79 years old. Performed at [facility], [place] [address] [place], [place] [address]   Respiratory (~20 pathogens) panel by PCR     Status: None   Collection Time: 11/23/21  9:16 AM   Specimen: Nasopharyngeal Swab; Respiratory  Result Value Ref Range Status   Adenovirus NOT DETECTED NOT DETECTED Final   Coronavirus 229E NOT DETECTED NOT DETECTED Final    Comment: (NOTE) The Coronavirus on the Respiratory Panel, DOES NOT test for the novel  Coronavirus (2019 nCoV)    Coronavirus HKU1 NOT DETECTED NOT DETECTED Final   Coronavirus NL63 NOT DETECTED NOT DETECTED Final   Coronavirus OC43 NOT DETECTED NOT DETECTED Final   Metapneumovirus NOT DETECTED NOT DETECTED Final   Rhinovirus / Enterovirus NOT DETECTED NOT DETECTED Final   Influenza A NOT DETECTED NOT DETECTED Final   Influenza B NOT DETECTED NOT DETECTED Final   Parainfluenza Virus 1 NOT DETECTED NOT DETECTED Final   Parainfluenza Virus 2 NOT DETECTED NOT DETECTED Final   Parainfluenza Virus 3 NOT DETECTED NOT DETECTED Final   Parainfluenza Virus 4 NOT DETECTED NOT DETECTED Final   Respiratory Syncytial Virus NOT DETECTED NOT DETECTED Final   Bordetella pertussis NOT DETECTED NOT DETECTED  Final   Bordetella Parapertussis NOT DETECTED NOT DETECTED Final   Chlamydophila pneumoniae NOT DETECTED NOT DETECTED Final   Mycoplasma pneumoniae NOT DETECTED NOT DETECTED Final    Comment: Performed at [facility], [place]. [address], [place], [place] [address]  Culture, blood (Routine X 2) w Reflex to ID Panel     Status: None   Collection Time: 11/25/21 11:16 PM   Specimen: BLOOD  Result Value Ref Range Status   Specimen Description   Final    BLOOD LEFT ANTECUBITAL Performed at [facility] [address] [place],  [address]    Special Requests   Final    BOTTLES DRAWN AEROBIC ONLY Blood Culture adequate volume Performed at [facility]  [address] [place], [place] [address]    Culture   Final    NO GROWTH 5 DAYS Performed at [facility], [place] [address], [place], [place] [address]    Report Status 12/01/2021 FINAL  Final  Culture, blood (Routine X 2) w Reflex to ID Panel     Status: None   Collection Time: 11/25/21 11:16 PM   Specimen: BLOOD  Result Value Ref Range Status   Specimen Description   Final    BLOOD RIGHT ANTECUBITAL Performed at [facility] [address], , [place] [address]    Special Requests   Final    BOTTLES DRAWN AEROBIC ONLY Blood Culture results may not be optimal due to an inadequate volume of blood received in culture bottles Performed at [facility] [address], [place], [place] [address]    Culture   Final    NO GROWTH 5 DAYS Performed at [facility], [place] [address] [place], [place] [address]    Report Status 12/01/2021 FINAL  Final     Time coordinating discharge:  39 minutes  SIGNED: Georgette Shell, MD  [facility] 12/01/2021, 3:33 PM

## 2021-12-01 NOTE — Progress Notes (Signed)
Speech Language Pathology Treatment: Dysphagia  Patient Details Name: Karen Casey MRN: [id number] DOB: [birth year] Today's Date: 12/01/2021 Time: 1445-1500 SLP Time Calculation (min) (ACUTE ONLY): 15 min  Assessment / Plan / Recommendation Clinical Impression  Pt seen for skilled SLP to address her dysphagia.  Reviewed prior MBS with pt that showed aspiration with cough response and strong pharyngeal swallow.  Today she is to dc home per notes.  Upon entrance to room, pt alert, asking for her purse with mildly dysphonic voice and minimal dysarthria but all much improved compared to last Friday 6/16.  She was willing to conduct 3 ounce Yale water screen - but did not pass due to requiring rest break x2.  Pt willing to consume small amount of sandwich but took one bite and said "That's nasty" and spat it out.  She consumed further intake of water to help clear - subtle weak cough noted x1 of 5 swallows post- eructation - suspect reflux issues.  Advised pt use flutter valve at home 10x an hour that she is awake. She demonstrates its use x4  - properly x1/4 trials with moderate visual verbal cues to exhale into the trainer. She then put the flutter in her purse saying she was ready to go. SLP offered to write down the information re: flutter use, but pt declined stating she will recall it.  Recommend continue diet as tolerated. No SLP follow up needed.  Pt agreeable to plan.    HPI HPI: 66 yo female adm to [facility] after being found down at home lying in feces. Pt has PMH + for ETOH, tobacco use, chrnoic smoking, dkysphagia, h/o pna - requiring intubation 3/29-4//06/2020.  Swallow eval ordered.  Per RN, pt's sister reports pt has problems swallowing prior to admission.  Pt underwent MBS 09/16/2020 with laryngeal penetration of thin, but strong swallow reflx - oral deficits with recommendation for dys2/thin without straws.  CT head showed bifrontal atrophy and chronic microvascular ischemic changes.  Imaging  11/20/2021 showed acute rib fx and ? mild esophagitis.  CT chest concerning for pna, right middle lobe and right lower lobe worsening ground glass opacities.  Pt has an NG in place that is suctioning some yellow fluid.  Pt tells this SLP that she wants to go hom and wants a smoke.      SLP Plan  All goals met      Recommendations for follow up therapy are one component of a multi-disciplinary discharge planning process, led by the attending physician.  Recommendations may be updated based on patient status, additional functional criteria and insurance authorization.    Recommendations  Diet recommendations: Regular;Thin liquid Liquids provided via: Cup;Straw Medication Administration: Whole meds with puree Supervision: Patient able to self feed Compensations: Small sips/bites;Slow rate Postural Changes and/or Swallow Maneuvers: Seated upright 90 degrees;Upright 30-60 min after meal                Oral Care Recommendations: Oral care BID Follow Up Recommendations: No SLP follow up Assistance recommended at discharge: Frequent or constant Supervision/Assistance SLP Visit Diagnosis: Dysphagia, unspecified (R13.10);Dysphagia, oral phase (R13.11) Plan: All goals met          Karen Lime, MS [facility] SLP Acute Rehab Services Office [phone] Pager [phone]  Macario Golds  12/01/2021, 3:13 PM

## 2021-12-01 NOTE — TOC Transition Note (Signed)
Transition of Care [facility]) - CM/SW Discharge Note   Patient Details  Name: Karen Casey MRN: [id number] Date of Birth: 03-16-1956  Transition of Care [facility]) CM/SW Contact:  Vassie Moselle, LCSW Phone Number: 12/01/2021, 3:21 PM   Clinical Narrative:    Met with pt who states she does not want SNF placement but, is agreeable to HHPT. Pt was agitated and frustrated however, was oriented x 4. CSW called spouse to confirm plans as pt has had fluctuating orientations. Pt's husband, Cecilie Kicks is agreeable to the plan for pt to return home with HHPT. HHPT has been arranged with [facility]. No DME needs identified. Per nursing staff there is concern that this pt will not be able to get into spouses car and will need PTAR home. PTAR called at 1515.    Final next level of care: Home w Home Health Services Barriers to Discharge: Barriers Resolved   Patient Goals and CMS Choice Patient states their goals for this hospitalization and ongoing recovery are:: Return home   Choice offered to / list presented to : Patient  Discharge Placement                       Discharge Plan and Services In-house Referral: Clinical Social Work              DME Arranged: N/A DME Agency: NA       HH Arranged: PT HH Agency: [place] Date HH Agency Contacted: 12/01/21 Time [place]: [phone] Representative spoke with at [place]: Amy  Social Determinants of Health ([place]) Interventions     Readmission Risk Interventions    12/01/2021    3:19 PM 11/23/2021    2:58 PM  Readmission Risk Prevention Plan  Transportation Screening Complete   Medication Review Press photographer) Complete Complete  PCP or Specialist appointment within 3-5 days of discharge Complete   HRI or [place] Complete Complete  SW Recovery Care/Counseling Consult Complete Complete  Palliative Care Screening Not Applicable Not [place] Patient Refused Not Complete   SNF Comments  Patient's orientation is fluctuating and patient was in mittens in last 24 hours.

## 2021-12-01 NOTE — Progress Notes (Signed)
Speech therapy at bedside.

## 2021-12-01 NOTE — Progress Notes (Signed)
Patient is begging for sleeping medication. Notified Olena Heckle and received Melatonin 5 mg x one dose.

## 2021-12-01 NOTE — Progress Notes (Signed)
Picc line removed, will wait 30 minutes for observation.

## 2021-12-01 NOTE — TOC Progression Note (Addendum)
Transition of Care [facility]) - Progression Note    Patient Details  Name: Karen Casey MRN: [id number] Date of Birth: 08-22-1955  Transition of Care [facility]) CM/SW [place], [place] Phone Number: 12/01/2021, 11:37 AM  Clinical Narrative:    Pt is currently a Level II PASRR. State to evaluate pt on 6/21 for PASRR determination.    Expected Discharge Plan: [place] Barriers to Discharge: Continued Medical Work up  Expected Discharge Plan and Services Expected Discharge Plan: [place] In-house Referral: Clinical Social Work     Living arrangements for the past 2 months: Single Family Home Expected Discharge Date: 12/01/21               DME Arranged: N/A DME Agency: NA                   Social Determinants of Health (SDOH) Interventions    Readmission Risk Interventions    11/23/2021    2:58 PM  Readmission Risk Prevention Plan  Medication Review (RN Care Manager) Complete  HRI or Home Care Consult Complete  SW Recovery Care/Counseling Consult Complete  Palliative Care Screening Not Applicable  Skilled Nursing Facility Not Complete  SNF Comments Patient's orientation is fluctuating and patient was in mittens in last 24 hours.

## 2021-12-02 ENCOUNTER — Other Ambulatory Visit: Payer: Self-pay | Admitting: Family Medicine

## 2021-12-07 ENCOUNTER — Telehealth: Payer: Self-pay | Admitting: Family Medicine

## 2021-12-07 NOTE — Telephone Encounter (Signed)
Will forward to MD. Jazmin Hartsell,CMA  

## 2021-12-07 NOTE — Telephone Encounter (Signed)
Patient would like Dr. Miquel Dunn to give her a call regarding making an appointment. She called for an appointment because she 'wants to be able to walk again.' Offered Dr. Carlena Bjornstad first available and patient did not want that. She said 'Dr. Miquel Dunn will get me in before that.'   Please call patient when you get a chance.

## 2021-12-08 ENCOUNTER — Other Ambulatory Visit: Payer: Self-pay | Admitting: Family Medicine

## 2021-12-08 DIAGNOSIS — F419 Anxiety disorder, unspecified: Secondary | ICD-10-CM

## 2021-12-08 NOTE — Telephone Encounter (Signed)
Patient LVM on nurse line around 1200 today stating, "I really need to talk with Dr. Miquel Dunn."   Patient is tearful saying that she needs help getting into a facility to help her learn to walk again.   Will forward to PCP.   Veronda Prude, RN

## 2021-12-09 ENCOUNTER — Telehealth: Payer: Self-pay | Admitting: Family Medicine

## 2021-12-09 NOTE — Telephone Encounter (Signed)
Called patient. She notes she continues to be be weak and have paresthesias in her legs and trouble walking. She notes she has called nursing facilities and been told that the only way she could be admitted is coming from the hospital. She declined SNF during previous hospital stay, she notes "I was upset at the world." She notes she is now wanting [place] to SNF. I discussed why I want to see her in person to get labs, look at her pressure wounds, assess her neurology exam, and she notes she has a wheelchair but is unable to get up so family/friends are unable to get her to the clinic. I discussed that if she is unable to get around this is unsafe and I would recommend presenting to the ED. I also discussed a CCM referral to assist her with transportation to a clinic appointment, and offered sooner appointment with another provider, but she declines and would prefer to go to the ED. Her dog was recently diagnosed ill so she notes she wants to spend some time with her first, and then will go to ED. All questions and concerns addressed.  Yehuda Savannah MD

## 2021-12-10 ENCOUNTER — Telehealth: Payer: Self-pay

## 2021-12-10 NOTE — Telephone Encounter (Signed)
Received phone call from [place], [place] with [facility]. Patient refused HH start of care visit yesterday and today. Per PT, patient is planning on being evaluated in the hospital due to possible pneumonia. Also reports that patient is feeling depressed due to her dog being ill.    Received phone call from patient asking to speak with Dr. Thompson Grayer. States that she is having diarrhea, nausea and back pain. Returned call to patient. Patient continues to report difficulty with walking and concerns for pneumonia. Recommended patient being evaluated in the ED due to continued symptoms.   Patient is requesting returned call from Dr. Thompson Grayer to discuss further.   Talbot Grumbling, RN

## 2021-12-11 ENCOUNTER — Other Ambulatory Visit: Payer: Self-pay

## 2021-12-11 ENCOUNTER — Encounter (HOSPITAL_COMMUNITY): Payer: Self-pay

## 2021-12-11 ENCOUNTER — Emergency Department (HOSPITAL_COMMUNITY)
Admission: EM | Admit: 2021-12-11 | Discharge: 2021-12-12 | Disposition: A | Discharge status: Home / Self Care | Payer: Medicaid Other | Attending: Emergency Medicine | Admitting: Emergency Medicine

## 2021-12-11 ENCOUNTER — Emergency Department (HOSPITAL_COMMUNITY): Payer: Self-pay

## 2021-12-11 DIAGNOSIS — R051 Acute cough: Secondary | ICD-10-CM | POA: Insufficient documentation

## 2021-12-11 DIAGNOSIS — I10 Essential (primary) hypertension: Secondary | ICD-10-CM | POA: Insufficient documentation

## 2021-12-11 DIAGNOSIS — R000 Tachycardia, unspecified: Secondary | ICD-10-CM | POA: Insufficient documentation

## 2021-12-11 DIAGNOSIS — R1084 Generalized abdominal pain: Secondary | ICD-10-CM | POA: Insufficient documentation

## 2021-12-11 DIAGNOSIS — J449 Chronic obstructive pulmonary disease, unspecified: Secondary | ICD-10-CM | POA: Insufficient documentation

## 2021-12-11 DIAGNOSIS — R112 Nausea with vomiting, unspecified: Secondary | ICD-10-CM | POA: Insufficient documentation

## 2021-12-11 DIAGNOSIS — R0602 Shortness of breath: Secondary | ICD-10-CM | POA: Insufficient documentation

## 2021-12-11 DIAGNOSIS — Z7951 Long term (current) use of inhaled steroids: Secondary | ICD-10-CM | POA: Insufficient documentation

## 2021-12-11 LAB — URINALYSIS, ROUTINE W REFLEX MICROSCOPIC
Bilirubin Urine: NEGATIVE
Glucose, UA: NEGATIVE mg/dL
Ketones, ur: NEGATIVE mg/dL
Nitrite: NEGATIVE
Protein, ur: NEGATIVE mg/dL
Specific Gravity, Urine: >1.046 — ABNORMAL HIGH (ref 1.005–1.030)
pH: 5 (ref 5.0–8.0)

## 2021-12-11 LAB — CBC
HCT: 41.4 % (ref 36.0–46.0)
Hemoglobin: 13.4 g/dL (ref 12.0–15.0)
MCH: 32.4 pg (ref 26.0–34.0)
MCHC: 32.4 g/dL (ref 30.0–36.0)
MCV: 100.2 fL — ABNORMAL HIGH (ref 80.0–100.0)
Platelets: 307 10*3/uL (ref 150–400)
RBC: 4.13 MIL/uL (ref 3.87–5.11)
RDW: 14.2 % (ref 11.5–15.5)
WBC: 7.6 10*3/uL (ref 4.0–10.5)
nRBC: 0 % (ref 0.0–0.2)

## 2021-12-11 LAB — COMPREHENSIVE METABOLIC PANEL
ALT: 20 U/L (ref 0–44)
AST: 31 U/L (ref 15–41)
Albumin: 2.2 g/dL — ABNORMAL LOW (ref 3.5–5.0)
Alkaline Phosphatase: 116 U/L (ref 38–126)
Anion gap: 11 (ref 5–15)
BUN: <5 mg/dL — ABNORMAL LOW (ref 8–23)
CO2: 25 mmol/L (ref 22–32)
Calcium: 8.5 mg/dL — ABNORMAL LOW (ref 8.9–10.3)
Chloride: 105 mmol/L (ref 98–111)
Creatinine, Ser: 0.59 mg/dL (ref 0.44–1.00)
GFR, Estimated: >60 mL/min (ref >60)
Glucose, Bld: 94 mg/dL (ref 70–99)
Potassium: 3.5 mmol/L (ref 3.5–5.1)
Sodium: 141 mmol/L (ref 135–145)
Total Bilirubin: 0.7 mg/dL (ref 0.3–1.2)
Total Protein: 5.2 g/dL — ABNORMAL LOW (ref 6.5–8.1)

## 2021-12-11 LAB — LIPASE, BLOOD: Lipase: 23 U/L (ref 11–51)

## 2021-12-11 MED ORDER — SODIUM CHLORIDE 0.9 % IV BOLUS
1000.0000 mL | Freq: Once | INTRAVENOUS | Status: AC
  Administered 2021-12-11: 1000 mL via INTRAVENOUS

## 2021-12-11 MED ORDER — IOHEXOL 300 MG/ML  SOLN
100.0000 mL | Freq: Once | INTRAMUSCULAR | Status: AC | PRN
  Administered 2021-12-11: 100 mL via INTRAVENOUS

## 2021-12-11 MED ORDER — ONDANSETRON HCL 4 MG/2ML IJ SOLN
4.0000 mg | Freq: Once | INTRAMUSCULAR | Status: AC
  Administered 2021-12-11: 4 mg via INTRAVENOUS
  Filled 2021-12-11: qty 2

## 2021-12-11 MED ORDER — ONDANSETRON 4 MG PO TBDP: 4.0000 mg | ORAL_TABLET | Freq: Three times a day (TID) | ORAL | 0 refills | Status: DC | PRN

## 2021-12-11 MED ORDER — NICOTINE 21 MG/24HR TD PT24
21.0000 mg | MEDICATED_PATCH | Freq: Once | TRANSDERMAL | Status: DC
  Administered 2021-12-11: 21 mg via TRANSDERMAL
  Filled 2021-12-11: qty 1

## 2021-12-11 MED ORDER — FLUCONAZOLE 150 MG PO TABS
150.0000 mg | ORAL_TABLET | Freq: Once | ORAL | Status: AC
  Administered 2021-12-11: 150 mg via ORAL
  Filled 2021-12-11: qty 1

## 2021-12-11 NOTE — ED Triage Notes (Signed)
Pt BIB PT[facility] for eval of abd pain, N/V/D x 3 days. Pt reports ongoing neuropathic pain in her feet x months. EMS reports no vomiting during transit

## 2021-12-11 NOTE — ED Provider Notes (Signed)
[facility] EMERGENCY DEPARTMENT Provider Note   CSN: [id number] Arrival date & time: 12/11/21  0957     History  Chief Complaint  Patient presents with   Vomiting    Karen Casey is a 66 y.o. female COPD, paroxysmal atrial fibrillation  (not on anticoagulation), hypertension, neuromuscular disorder, seizures, alcohol use, anxiety, depression, GERD.  Status post appendectomy, last EGD 12/2020 performed by Dr. Havery Moros with [facility]ology.  Presents to the emergency department with a complaint of nausea, vomiting, and frequent bowel movements.  Patient reports that her symptoms started on Wednesday.  Patient reports that she was having 7 8 bowel movements a day.  Patient reports that bowel movements were solid and not diarrhea.  Patient reports that she took Imodium yesterday with resolution of her frequent bowel movements.  Patient reports that she has nausea at baseline and this has been something that she has been dealing with "all my life."  Patient denies having any vomiting.  Patient also reports history of chronic abdominal pain secondary to her GERD.  Denies any new or worsening abdominal pain.  Patient reports that she was recently treated for pneumonia.  States that she still has a productive cough.  Cough is producing yellow mucus.  When asked about shortness of breath patient states "a little bit."  Denies any fever, vomiting, hematemesis, coffee-ground emesis, constipation, blood in stool, melena, dysuria, hematuria, urinary urgency, urinary frequency, vaginal pain, vaginal bleeding, vaginal discharge, chest pain, palpitations, leg swelling or tenderness, lightheadedness, syncope.  HPI     Home Medications Prior to Admission medications   Medication Sig Start Date End Date Taking? Authorizing Provider  acetaminophen (TYLENOL) 650 MG CR tablet Take 1,950 mg by mouth 2 (two) times daily as needed for pain.    [provider]   albuterol (VENTOLIN HFA) 108 (90 Base) MCG/ACT inhaler Inhale 2 puffs into the lungs every 6 (six) hours as needed for wheezing or shortness of breath. 09/23/21   [name], MD  ANORO ELLIPTA 62.5-25 MCG/ACT AEPB INHALE 1 PUFF BY MOUTH EVERY DAY Patient taking differently: Inhale 1 puff into the lungs every morning. 06/09/21   Martyn Malay, MD  busPIRone (BUSPAR) 10 MG tablet TAKE 1 TABLET BY MOUTH THREE TIMES A DAY Patient taking differently: Take 10 mg by mouth 3 (three) times daily. 11/16/21   [name], MD  CALCIUM-MAGNESIUM-ZINC PO Take 1 tablet by mouth daily.    [provider]  Cholecalciferol (VITAMIN D3 PO) Take 1 capsule by mouth daily.    [provider]  famotidine (PEPCID) 20 MG tablet Take 20 mg by mouth 2 (two) times daily as needed for heartburn or indigestion.    [provider]  feeding supplement (ENSURE ENLIVE / ENSURE PLUS) LIQD Take 237 mLs by mouth 2 (two) times daily between meals. Patient taking differently: Take 237 mLs by mouth daily as needed (meal supplement). 01/06/21   Alen Bleacher, MD  folic acid (FOLVITE) 1 MG tablet Take 1 tablet (1 mg total) by mouth daily. 12/02/21   Georgette Shell, MD  gabapentin (NEURONTIN) 600 MG tablet TAKE 1 TABLET BY MOUTH THREE TIMES A DAY 12/02/21   [name], MD  Multiple Vitamins-Minerals (CENTRUM SILVER 50+WOMEN PO) Take 1 tablet by mouth daily.    [provider]  nicotine (NICODERM CQ - DOSED IN MG/24 HOURS) 21 mg/24hr patch Place 1 patch (21 mg total) onto the skin daily. 12/02/21   Georgette Shell,  MD  nortriptyline (PAMELOR) 25 MG capsule TAKE 2 CAPSULES (50 MG TOTAL) BY MOUTH EVERY DAY AT BEDTIME Patient taking differently: Take 75 mg by mouth at bedtime. 11/10/21   [name], MD  ondansetron (ZOFRAN) 4 MG tablet Take 1 tablet (4 mg total) by mouth every 6 (six) hours as needed for nausea. 12/01/21   Georgette Shell, MD  OXYGEN Inhale into the lungs as  needed.    [provider]  pantoprazole (PROTONIX) 40 MG tablet Take 1 tablet (40 mg total) by mouth every morning. 10/22/21   Pray, Norwood Levo, MD      Allergies    Capsaicin-menthol and Diclo gel [diclofenac sodium]    Review of Systems   Review of Systems  Constitutional:  Positive for chills. Negative for fever.  Eyes:  Negative for visual disturbance.  Respiratory:  Positive for cough and shortness of breath.   Cardiovascular:  Negative for chest pain.  Gastrointestinal:  Positive for abdominal pain and nausea. Negative for abdominal distention, anal bleeding, blood in stool, constipation, diarrhea, rectal pain and vomiting.  Genitourinary:  Negative for decreased urine volume, difficulty urinating, dysuria, flank pain, frequency, genital sores, hematuria, urgency, vaginal bleeding, vaginal discharge and vaginal pain.  Musculoskeletal:  Negative for back pain and neck pain.  Skin:  Negative for color change and rash.  Neurological:  Negative for dizziness, syncope, light-headedness and headaches.  Psychiatric/Behavioral:  Negative for confusion.     Physical Exam Updated Vital Signs BP 137/90 (BP Location: Right Arm)   Pulse (!) 116   Temp (!) 97.4 F (36.3 C) (Oral)   Resp (!) 22   Ht '5\' 3"'$  (1.6 m)   Wt 80 kg   SpO2 100%   BMI 31.24 kg/m  Physical Exam Vitals and nursing note reviewed.  Constitutional:      General: She is not in acute distress.    Appearance: She is not ill-appearing, toxic-appearing or diaphoretic.  HENT:     Head: Normocephalic.  Eyes:     General: No scleral icterus.       Right eye: No discharge.        Left eye: No discharge.  Cardiovascular:     Rate and Rhythm: Normal rate.  Pulmonary:     Effort: Pulmonary effort is normal. No tachypnea or bradypnea.     Breath sounds: Normal breath sounds. No stridor.     Comments: Speaks in full complete sentences without difficulty Abdominal:     General: Abdomen is flat. There is no  distension. There are no signs of injury.     Palpations: Abdomen is soft. There is no mass or pulsatile mass.     Tenderness: There is abdominal tenderness in the suprapubic area. There is no right CVA tenderness, left CVA tenderness, guarding or rebound.  Skin:    General: Skin is warm and dry.  Neurological:     General: No focal deficit present.     Mental Status: She is alert.     GCS: GCS eye subscore is 4. GCS verbal subscore is 5. GCS motor subscore is 6.  Psychiatric:        Behavior: Behavior is cooperative.     ED Results / Procedures / Treatments   Labs (all labs ordered are listed, but only abnormal results are displayed) Labs Reviewed  COMPREHENSIVE METABOLIC PANEL - Abnormal; Notable for the following components:      Result Value   BUN <5 (*)    Calcium  8.5 (*)    Total Protein 5.2 (*)    Albumin 2.2 (*)    All other components within normal limits  CBC - Abnormal; Notable for the following components:   MCV 100.2 (*)    All other components within normal limits  URINALYSIS, ROUTINE W REFLEX MICROSCOPIC - Abnormal; Notable for the following components:   APPearance HAZY (*)    Specific Gravity, Urine >1.046 (*)    Hgb urine dipstick SMALL (*)    Leukocytes,Ua MODERATE (*)    Bacteria, UA FEW (*)    All other components within normal limits  URINE CULTURE  LIPASE, BLOOD    EKG None  Radiology DG Chest 2 View  Result Date: 12/11/2021 CLINICAL DATA:  Shortness of breath, chest pain, nausea, and vomiting since Tuesday, atrial fibrillation; history hypertension, COPD EXAM: CHEST - 2 VIEW COMPARISON:  11/27/2021 FINDINGS: Normal heart size, mediastinal contours, and pulmonary vascularity. Bronchitic and emphysematous changes consistent with history of COPD. Markedly improved LEFT lung infiltrates and bibasilar atelectasis since previous study, with mild residual lingular infiltrate. No new consolidation, pleural effusion, or pneumothorax. Osseous  demineralization. IMPRESSION: COPD changes with markedly improved LEFT lung infiltrates since previous study with, mild lingular residual noted. Electronically Signed   By: Lavonia Dana M.D.   On: 12/11/2021 10:36    Procedures Procedures    Medications Ordered in ED Medications  iohexol (OMNIPAQUE) 300 MG/ML solution 100 mL (100 mLs Intravenous Contrast Given 12/11/21 1454)    ED Course/ Medical Decision Making/ A&P                           Medical Decision Making Amount and/or Complexity of Data Reviewed Labs: ordered.  Risk Prescription drug management.   Alert 66 year old female in no acute stress, nontoxic-appearing.  Presents emerged department complaint of frequent bowel movements, nausea and vomiting.  Information is obtained from patient.  I reviewed patient's past medical records including previous prior notes, admission/discharge notes, labs, and imaging.  Patient has medical history as outlined in HPI which complicates her care.  Per chart review patient was admitted to the emergency department 11/20/21 to 12/01/2021.  Patient was admitted due to acute Evoxac respiratory failure secondary to multiple pneumonia.  Patient received Rocephin and azithromycin and was then switched to Zosyn.  At time of discharge patient was back to room air  I personally viewed and interpreted patient's EKG.  Tracing shows sinus tachycardia with PAC.  I personally viewed and interpreted patient's chest x-ray.  Agree with radiology interpretation of COPD changes with markedly improved left lung infiltrate since previous study.  Mild lingual residual noted.  I personally viewed interpret patient's lab results.  Pertinent findings include: -CMP shows no large electrolyte abnormality -Lipase within normal limits -CBC unremarkable -Urinalysis shows increased specific gravity, budding yeast present, bacteria few, leukocytes moderate, nitrite negative, WBC 0-5, RBC 0-5.  I personally viewed  interpret patient's CT abdomen pelvis.  Agree with radiology interpretation of: -No acute findings within the abdomen or pelvis.  Urinalysis shows budding yeast, will treat patient with dose of fluconazole as may be secondary to her recent antibiotic use.  Increased [place] gravity likely consistent with some dehydration.  Patient received 1 L fluid bolus.  Patient not having any urinary urinary symptoms bacteria rare, we will hold any treatment at this time.  Culture is pending.  Patient is able to tolerate p.o. intake.  Patient has improvement in tachycardia after receiving a  liter fluid bolus.  Patient hemodynamically stable.  Will discharge patient to follow-up with her PCP in the outpatient setting.  Patient reports that she is only able to stand and pivot.  Per chart review patient had TOC consult when she was admitted to the hospital, she declined any SNF and already has home health PT and aids ordered.  Patient care discussed with attending Dr. Tomi Bamberger.  Based on patient's chief complaint, I considered admission might be necessary, however after reassuring ED workup feel patient is reasonable for discharge.  Discussed results, findings, treatment and follow up. Patient advised of return precautions. Patient verbalized understanding and agreed with plan.  Portions of this note were generated with Lobbyist. Dictation errors may occur despite best attempts at proofreading.         Final Clinical Impression(s) / ED Diagnoses Final diagnoses:  Generalized abdominal pain  Acute cough    Rx / DC Orders ED Discharge Orders          Ordered    ondansetron (ZOFRAN-ODT) 4 MG disintegrating tablet  Every 8 hours PRN        12/11/21 1747              Loni Beckwith, PA-C 12/11/21 1747    Dorie Rank, MD 12/13/21 [phone]

## 2021-12-11 NOTE — ED Notes (Signed)
Patient has large rash all over her buttocks and vagina. Patient had BM on herself. Patient cleaned up, cream applied to rash, and new diaper placed.

## 2021-12-11 NOTE — ED Notes (Signed)
Complains of neuropathic pain.

## 2021-12-11 NOTE — ED Notes (Signed)
Patient denies any needs/concerns/pain. Awaiting PTAR.

## 2021-12-11 NOTE — Discharge Instructions (Addendum)
You came to the [facility] today to be evaluated for your frequent bowel movements, nausea, vomiting, and productive cough.  The CT scan of your abdomen and pelvis did not show any acute abnormalities.  You were able to tolerate p.o. intake after receiving antinausea medicine.  I am sending you home with the same antinausea medicine.  Please follow-up with [facility] for further management of your chronic abdominal pain.  The x-ray of your chest showed that your pneumonia has improved.  Please follow-up with your primary care doctor for repeat evaluation.  Your urine sample did show a yeast infection.  Due to this she received the medication fluconazole in the emergency department.  There is no further treatment needed at this time.  Get help right away if: Your pain does not go away as soon as your health care provider told you to expect. You cannot stop vomiting. Your pain is only in areas of the abdomen, such as the right side or the left lower portion of the abdomen. Pain on the right side could be caused by appendicitis. You have bloody or black stools, or stools that look like tar. You have severe pain, cramping, or bloating in your abdomen. You have signs of dehydration, such as: Dark urine, very little urine, or no urine. Cracked lips. Dry mouth. Sunken eyes. Sleepiness. Weakness. You have trouble breathing or chest pain.

## 2021-12-11 NOTE — ED Provider Triage Note (Signed)
Emergency Medicine Provider Triage Evaluation Note  Karen Casey , a 66 y.o. female  was evaluated in triage.  Pt complains of nausea vomiting and diarrhea for the past 3 days.  Vomiting "I can't count" but none today Diarrhea "ten times a day" she took imodium seemed to help.   Abd hurts currently.   Coughing and SOB since pneumonia (DC-ed the 20th).   Review of Systems  Positive: SOB, NVD, fatigue Negative: Fever   Physical Exam  Ht '5\' 3"'$  (1.6 m)   Wt 80 kg   BMI 31.24 kg/m  Gen:   Awake, no distress   Resp:  Normal effort  MSK:   Moves extremities without difficulty  Other:  Abd soft, TTP diffusely especially upper abd  Medical Decision Making  Medically screening exam initiated at 10:11 AM.  Appropriate orders placed.  [name] was informed that the remainder of the evaluation will be completed by another provider, this initial triage assessment does not replace that evaluation, and the importance of remaining in the ED until their evaluation is complete.  Labs, lactic, ekg, abd labs   Tedd Sias, [place] 12/11/21 1015

## 2021-12-12 NOTE — ED Notes (Signed)
Ptar called 

## 2021-12-13 LAB — URINE CULTURE: Culture: 60000 — AB

## 2021-12-14 ENCOUNTER — Telehealth: Payer: Self-pay | Admitting: Emergency Medicine

## 2021-12-14 NOTE — Progress Notes (Signed)
ED Antimicrobial Stewardship Positive Culture Follow Up   Karen Casey is an 66 y.o. female who presented to [facility] on 12/11/2021 with a chief complaint of  Chief Complaint  Patient presents with   Vomiting    Recent Results (from the past 720 hour(s))  Culture, blood (Routine x 2)     Status: None   Collection Time: 11/20/21  8:58 PM   Specimen: BLOOD  Result Value Ref Range Status   Specimen Description   Final    BLOOD BLOOD RIGHT HAND Performed at [facility], [place] [address] [place], [place] [address]    Special Requests   Final    BOTTLES DRAWN AEROBIC AND ANAEROBIC Blood Culture results may not be optimal due to an inadequate volume of blood received in culture bottles Performed at [place] [address], [place], [place] [address]    Culture   Final    NO GROWTH 5 DAYS Performed at [facility], [place] [address] [place], [place] [address]    Report Status 11/26/2021 FINAL  Final  Culture, blood (Routine x 2)     Status: None   Collection Time: 11/20/21  9:03 PM   Specimen: BLOOD  Result Value Ref Range Status   Specimen Description   Final    BLOOD BLOOD LEFT HAND Performed at [place] [address], [place], [place] [address]    Special Requests   Final    BOTTLES DRAWN AEROBIC AND ANAEROBIC Blood Culture adequate volume Performed at [place] [address], [place], [place] [address]    Culture   Final    NO GROWTH 5 DAYS Performed at [facility], [place] [address] [place], [place] [address]    Report Status 11/26/2021 FINAL  Final  Urine Culture     Status: Abnormal   Collection Time: 11/21/21  8:48 AM   Specimen: Urine, Clean Catch  Result Value Ref Range Status   Specimen Description   Final    URINE, CLEAN CATCH Performed at [facility], [place] [address], [place], [place] [address]    Special Requests   Final     NONE Performed at [facility], [place] [address] [place], [place] [address]    Culture MULTIPLE SPECIES PRESENT, SUGGEST RECOLLECTION (A)  Final   Report Status 11/22/2021 FINAL  Final  MRSA Next Gen by PCR, Nasal     Status: None   Collection Time: 11/22/21  9:38 AM   Specimen: Nasal Mucosa; Nasal Swab  Result Value Ref Range Status   MRSA by PCR Next Gen NOT DETECTED NOT DETECTED Final    Comment: (NOTE) The GeneXpert MRSA Assay (FDA approved for NASAL specimens only), is one component of a comprehensive MRSA colonization surveillance program. It is not intended to diagnose MRSA infection nor to guide or monitor treatment for MRSA infections. Test performance is not FDA approved in patients less than 52 years old. Performed at [facility], [place] [address], [place], [place] [address]   Respiratory (~20 pathogens) panel by PCR     Status: None   Collection Time: 11/23/21  9:16 AM   Specimen: Nasopharyngeal Swab; Respiratory  Result Value Ref Range Status   Adenovirus NOT DETECTED NOT DETECTED Final   Coronavirus 229E NOT DETECTED NOT DETECTED Final    Comment: (NOTE) The Coronavirus on the Respiratory Panel, DOES NOT test for the novel  Coronavirus (  2019 nCoV)    Coronavirus HKU1 NOT DETECTED NOT DETECTED Final   Coronavirus NL63 NOT DETECTED NOT DETECTED Final   Coronavirus OC43 NOT DETECTED NOT DETECTED Final   Metapneumovirus NOT DETECTED NOT DETECTED Final   Rhinovirus / Enterovirus NOT DETECTED NOT DETECTED Final   Influenza A NOT DETECTED NOT DETECTED Final   Influenza B NOT DETECTED NOT DETECTED Final   Parainfluenza Virus 1 NOT DETECTED NOT DETECTED Final   Parainfluenza Virus 2 NOT DETECTED NOT DETECTED Final   Parainfluenza Virus 3 NOT DETECTED NOT DETECTED Final   Parainfluenza Virus 4 NOT DETECTED NOT DETECTED Final   Respiratory Syncytial Virus NOT DETECTED NOT DETECTED Final   Bordetella pertussis NOT DETECTED NOT DETECTED  Final   Bordetella Parapertussis NOT DETECTED NOT DETECTED Final   Chlamydophila pneumoniae NOT DETECTED NOT DETECTED Final   Mycoplasma pneumoniae NOT DETECTED NOT DETECTED Final    Comment: Performed at [facility], [place] [address], [place], [place] [address]  Culture, blood (Routine X 2) w Reflex to ID Panel     Status: None   Collection Time: 11/25/21 11:16 PM   Specimen: BLOOD  Result Value Ref Range Status   Specimen Description   Final    BLOOD LEFT ANTECUBITAL Performed at [place] [address] [place] [address] [place], [place] [address]    Special Requests   Final    BOTTLES DRAWN AEROBIC ONLY Blood Culture adequate volume Performed at [place] [address], [place], [place] [address]    Culture   Final    NO GROWTH 5 DAYS Performed at [facility], [place] [address] [place], [place] [address]    Report Status 12/01/2021 FINAL  Final  Culture, blood (Routine X 2) w Reflex to ID Panel     Status: None   Collection Time: 11/25/21 11:16 PM   Specimen: BLOOD  Result Value Ref Range Status   Specimen Description   Final    BLOOD RIGHT ANTECUBITAL Performed at [place] [address] [place], [place] [address]    Special Requests   Final    BOTTLES DRAWN AEROBIC ONLY Blood Culture results may not be optimal due to an inadequate volume of blood received in culture bottles Performed at [place] [address] [place], [place] [address]    Culture   Final    NO GROWTH 5 DAYS Performed at [facility], [place] [address] [place], [place] [address]    Report Status 12/01/2021 FINAL  Final  Urine Culture     Status: Abnormal   Collection Time: 12/11/21  4:44 PM   Specimen: Urine, Clean Catch  Result Value Ref Range Status   Specimen Description URINE, CLEAN CATCH  Final   Special Requests   Final    NONE Performed at [facility], [place] [address] [place], [place]  [address]    Culture (A)  Final    60,000 COLONIES/mL KLEBSIELLA PNEUMONIAE >=100,000 COLONIES/mL ENTEROCOCCUS FAECALIS    Report Status 12/13/2021 FINAL  Final   Organism ID, Bacteria KLEBSIELLA PNEUMONIAE (A)  Final   Organism ID, Bacteria ENTEROCOCCUS FAECALIS (A)  Final      Susceptibility   Enterococcus faecalis - MIC*    AMPICILLIN <=2 SENSITIVE Sensitive     NITROFURANTOIN <=16 SENSITIVE Sensitive     VANCOMYCIN 1 SENSITIVE Sensitive     * >=100,000 COLONIES/mL ENTEROCOCCUS FAECALIS   Klebsiella pneumoniae - MIC*  AMPICILLIN RESISTANT Resistant     CEFAZOLIN <=4 SENSITIVE Sensitive     CEFEPIME <=0.12 SENSITIVE Sensitive     CEFTRIAXONE <=0.25 SENSITIVE Sensitive     CIPROFLOXACIN <=0.25 SENSITIVE Sensitive     GENTAMICIN <=1 SENSITIVE Sensitive     IMIPENEM <=0.25 SENSITIVE Sensitive     NITROFURANTOIN <=16 SENSITIVE Sensitive     TRIMETH/SULFA <=20 SENSITIVE Sensitive     AMPICILLIN/SULBACTAM 4 SENSITIVE Sensitive     PIP/TAZO <=4 SENSITIVE Sensitive     * 60,000 COLONIES/mL KLEBSIELLA PNEUMONIAE   '[x]'$  Patient discharged originally without antimicrobial agent and treatment is now indicated  New antibiotic prescription: Nitrofurantoin (MacroBID) '100mg'$  by mouth twice daily x 5 days  ED Provider: Sherrell Puller, PA-C   Kaleen Mask 12/14/2021, 7:59 AM Clinical Pharmacist Monday - Friday phone -  [phone] Saturday - Sunday phone - [phone]

## 2021-12-14 NOTE — Telephone Encounter (Signed)
Post ED Visit - Positive Culture Follow-up: Successful Patient Follow-Up  Culture assessed and recommendations reviewed by:  '[]'$  Elenor Quinones, Pharm.D. '[]'$  Heide Guile, Pharm.D., BCPS AQ-ID '[]'$  Parks Neptune, Pharm.D., BCPS '[]'$  Alycia Rossetti, Pharm.D., BCPS '[]'$  [place], [place].D., BCPS, AAHIVP '[]'$  Legrand Como, Pharm.D., BCPS, AAHIVP '[]'$  Salome Arnt, PharmD, BCPS '[]'$  Johnnette Gourd, PharmD, BCPS '[]'$  Hughes Better, PharmD, BCPS '[]'$  Leeroy Cha, PharmD  Positive urine culture  '[x]'$  Patient discharged without antimicrobial prescription and treatment is now indicated '[]'$  Organism is resistant to prescribed ED discharge antimicrobial '[]'$  Patient with positive blood cultures  Changes discussed with ED provider: Arloa Koh [facility] New antibiotic prescription start nitrofurantoin '100mg'$  po twice daily x 5 days Called to [address]  Contacted patient   Karen Casey 12/14/2021, 10:35 AM

## 2021-12-17 ENCOUNTER — Telehealth: Payer: Self-pay

## 2021-12-17 NOTE — Telephone Encounter (Signed)
Patient LVM on nurse line requesting help getting into [facility] rehab facility.   Patient reports she went to the ED and sat there for hours and nobody helped her.   Patient was tearful stating, "Dr. Thompson Grayer please help me."   Attempted to call patient back, however no answer.   Will forward to PCP.

## 2021-12-18 ENCOUNTER — Telehealth: Payer: Self-pay | Admitting: Family Medicine

## 2021-12-18 DIAGNOSIS — R627 Adult failure to thrive: Secondary | ICD-10-CM

## 2021-12-18 NOTE — Telephone Encounter (Signed)
Attempted to call patient x1, no answer, no VM available.  CCM referral placed, pt previously had qualified for SNF at last hospital stay, will place urgent referral to see if she is still within 30 day window where she could qualify for SNF.  Yehuda Savannah MD

## 2021-12-18 NOTE — Telephone Encounter (Signed)
Patient returns call to nurse line. Advised patient of message from Dr. Thompson Grayer. Also recommended that patient keep phone near her so she can speak directly with care management team when they call her.   Patient is appreciative of the assistance provided by Dr. Thompson Grayer.   Talbot Grumbling, RN

## 2021-12-21 ENCOUNTER — Telehealth: Payer: Self-pay

## 2021-12-21 NOTE — Telephone Encounter (Signed)
Almira from [facility] calling for PT verbal orders as follows:  2 time(s) weekly for 3 week(s), then 1 time(s) weekly for 1 week(s)  Verbal orders given per [facility] protocol  Talbot Grumbling, RN

## 2021-12-22 ENCOUNTER — Telehealth: Payer: Self-pay | Admitting: *Deleted

## 2021-12-22 NOTE — Chronic Care Management (AMB) (Signed)
  Care Coordination  Note  12/22/2021 Name: [name] MRN: [id number] DOB: 1955/10/05  Karen Casey is a 66 y.o. year old female who is a primary care patient of Pray, Norwood Levo, MD. I reached out to [name] by phone today to offer care coordination services.      Follow up plan: Unsuccessful telephone outreach attempt made. A HIPAA compliant phone message was left for the patient providing contact information and requesting a return call.  The care guide will reach out to the patient again over the next 7 days.  If patient calls provider office to request assistance with care coordination needs, please contact the care guide at the number below.   [place]  Direct Dial: [phone]

## 2021-12-22 NOTE — Chronic Care Management (AMB) (Signed)
  Care Coordination  Note  12/22/2021 Name: Karen Casey MRN: [id number] DOB: 08-21-1955  Karen Casey is a 66 y.o. year old female who is a primary care patient of Pray, Norwood Levo, MD. I reached out to [name] by phone today to offer care coordination services.      [name] was given information about Care Coordination services today including:  The Care Coordination services include support from the care team which includes your Nurse Coordinator, Clinical Social Worker, or Pharmacist.  The Care Coordination team is here to help remove barriers to the health concerns and goals most important to you. Care Coordination services are voluntary and the patient may decline or stop services at any time by request to their care team member.   Patient agreed to services and verbal consent obtained.   Follow up plan: Telephone appointment with care coordination team member scheduled for:12/25/21  [facility]  Direct Dial: [phone]

## 2021-12-23 ENCOUNTER — Telehealth: Payer: Self-pay

## 2021-12-23 NOTE — Telephone Encounter (Signed)
Karen Casey calls nurse line reporting patient has declined [place] [facility].

## 2021-12-25 ENCOUNTER — Ambulatory Visit: Payer: Self-pay | Admitting: Licensed Clinical Social Worker

## 2021-12-25 NOTE — Patient Outreach (Addendum)
  Care Coordination   Initial Visit Note   12/25/2021 Name: TRACIE DORE MRN: [id number] DOB: 1955-08-18  [name] is a 66 y.o. year old female who sees Pray, Norwood Levo, MD for primary care. I spoke with  [name] by phone today  What matters to the patients health and wellness today?  SNF placement. Patient declined wanted SNF placement. SW educated patient on benefits. Patient declined. Patient requested to speak with an RNCM regarding Neuropathy. Sent request to for RN outreach.   Goals Addressed   None     SDOH assessments and interventions completed:   Yes   Care Coordination Interventions Activated:  Yes Care Coordination Interventions:   Yes, provided  Follow up plan: No further intervention required.  Encounter Outcome:  Pt. Visit Completed

## 2021-12-30 ENCOUNTER — Telehealth: Payer: Self-pay

## 2021-12-30 ENCOUNTER — Ambulatory Visit: Payer: Self-pay | Admitting: Licensed Clinical Social Worker

## 2021-12-30 NOTE — Patient Outreach (Signed)
  Care Coordination   Follow Up Visit Note   12/30/2021 Name: Karen Casey MRN: [id number] DOB: February 19, 1956  Karen Casey is a 66 y.o. year old female who sees Pray, Norwood Levo, MD for primary care.   Unsuccessful outreach to patient on today. SW received inbox paitent needed assistance with SNF placement. SW started Fl2 form and requested provider to completed medical section. Once received back, SW will follow up with patient.  What matters to the patients health and wellness today?  Placement/Fl2    SDOH assessments and interventions completed:   Yes   Care Coordination Interventions Activated:  Yes Care Coordination Interventions:   Yes, provided  Follow up plan: Follow up call scheduled for 01/06/2022  Encounter Outcome:  Pt. Visit Completed  Lenor Derrick, MSW  Social Worker [facility] Care Management  [phone]

## 2021-12-30 NOTE — Telephone Encounter (Signed)
Patient calls nurse line in regards to SNF placement.   Patient reports she spoke with SW a few days ago, however "nothing was done to help me."   However, I see in SW note patient declined SNF services.   Patient is asking for help again in trying to find a rehab center.   Will forward to PCP and SW.

## 2022-01-02 ENCOUNTER — Emergency Department (HOSPITAL_COMMUNITY): Payer: Medicare Other

## 2022-01-02 ENCOUNTER — Inpatient Hospital Stay (HOSPITAL_COMMUNITY)
Admission: EM | Admit: 2022-01-02 | Discharge: 2022-01-07 | DRG: 872 | Disposition: A | Discharge status: Skilled Nursing Facility | Payer: Medicare Other | Attending: Internal Medicine | Admitting: Internal Medicine

## 2022-01-02 ENCOUNTER — Other Ambulatory Visit: Payer: Self-pay

## 2022-01-02 ENCOUNTER — Encounter (HOSPITAL_COMMUNITY): Payer: Self-pay | Admitting: Emergency Medicine

## 2022-01-02 ENCOUNTER — Inpatient Hospital Stay (HOSPITAL_COMMUNITY): Payer: Medicare Other

## 2022-01-02 DIAGNOSIS — A419 Sepsis, unspecified organism: Secondary | ICD-10-CM | POA: Diagnosis not present

## 2022-01-02 DIAGNOSIS — Z993 Dependence on wheelchair: Secondary | ICD-10-CM

## 2022-01-02 DIAGNOSIS — Z6823 Body mass index (BMI) 23.0-23.9, adult: Secondary | ICD-10-CM | POA: Diagnosis not present

## 2022-01-02 DIAGNOSIS — A4181 Sepsis due to Enterococcus: Principal | ICD-10-CM | POA: Diagnosis present

## 2022-01-02 DIAGNOSIS — E875 Hyperkalemia: Secondary | ICD-10-CM | POA: Diagnosis not present

## 2022-01-02 DIAGNOSIS — Z7951 Long term (current) use of inhaled steroids: Secondary | ICD-10-CM | POA: Diagnosis not present

## 2022-01-02 DIAGNOSIS — K529 Noninfective gastroenteritis and colitis, unspecified: Secondary | ICD-10-CM | POA: Diagnosis present

## 2022-01-02 DIAGNOSIS — G621 Alcoholic polyneuropathy: Secondary | ICD-10-CM | POA: Diagnosis present

## 2022-01-02 DIAGNOSIS — G6289 Other specified polyneuropathies: Secondary | ICD-10-CM

## 2022-01-02 DIAGNOSIS — Z8249 Family history of ischemic heart disease and other diseases of the circulatory system: Secondary | ICD-10-CM

## 2022-01-02 DIAGNOSIS — F419 Anxiety disorder, unspecified: Secondary | ICD-10-CM | POA: Diagnosis present

## 2022-01-02 DIAGNOSIS — B370 Candidal stomatitis: Secondary | ICD-10-CM | POA: Diagnosis present

## 2022-01-02 DIAGNOSIS — I480 Paroxysmal atrial fibrillation: Secondary | ICD-10-CM | POA: Diagnosis present

## 2022-01-02 DIAGNOSIS — J449 Chronic obstructive pulmonary disease, unspecified: Secondary | ICD-10-CM | POA: Diagnosis present

## 2022-01-02 DIAGNOSIS — E876 Hypokalemia: Secondary | ICD-10-CM | POA: Diagnosis present

## 2022-01-02 DIAGNOSIS — F1021 Alcohol dependence, in remission: Secondary | ICD-10-CM | POA: Diagnosis present

## 2022-01-02 DIAGNOSIS — R1310 Dysphagia, unspecified: Secondary | ICD-10-CM | POA: Diagnosis present

## 2022-01-02 DIAGNOSIS — D649 Anemia, unspecified: Secondary | ICD-10-CM | POA: Diagnosis present

## 2022-01-02 DIAGNOSIS — E861 Hypovolemia: Secondary | ICD-10-CM | POA: Diagnosis present

## 2022-01-02 DIAGNOSIS — I10 Essential (primary) hypertension: Secondary | ICD-10-CM | POA: Diagnosis present

## 2022-01-02 DIAGNOSIS — K760 Fatty (change of) liver, not elsewhere classified: Secondary | ICD-10-CM | POA: Diagnosis present

## 2022-01-02 DIAGNOSIS — K219 Gastro-esophageal reflux disease without esophagitis: Secondary | ICD-10-CM | POA: Diagnosis present

## 2022-01-02 DIAGNOSIS — F32A Depression, unspecified: Secondary | ICD-10-CM | POA: Diagnosis present

## 2022-01-02 DIAGNOSIS — K449 Diaphragmatic hernia without obstruction or gangrene: Secondary | ICD-10-CM | POA: Diagnosis present

## 2022-01-02 DIAGNOSIS — B952 Enterococcus as the cause of diseases classified elsewhere: Secondary | ICD-10-CM

## 2022-01-02 DIAGNOSIS — R6520 Severe sepsis without septic shock: Secondary | ICD-10-CM | POA: Diagnosis present

## 2022-01-02 DIAGNOSIS — Z888 Allergy status to other drugs, medicaments and biological substances status: Secondary | ICD-10-CM

## 2022-01-02 DIAGNOSIS — E860 Dehydration: Secondary | ICD-10-CM | POA: Diagnosis present

## 2022-01-02 DIAGNOSIS — E440 Moderate protein-calorie malnutrition: Secondary | ICD-10-CM | POA: Diagnosis present

## 2022-01-02 DIAGNOSIS — Z79899 Other long term (current) drug therapy: Secondary | ICD-10-CM | POA: Diagnosis not present

## 2022-01-02 DIAGNOSIS — J441 Chronic obstructive pulmonary disease with (acute) exacerbation: Secondary | ICD-10-CM

## 2022-01-02 DIAGNOSIS — R000 Tachycardia, unspecified: Secondary | ICD-10-CM | POA: Diagnosis present

## 2022-01-02 DIAGNOSIS — Z818 Family history of other mental and behavioral disorders: Secondary | ICD-10-CM | POA: Diagnosis not present

## 2022-01-02 DIAGNOSIS — F418 Other specified anxiety disorders: Secondary | ICD-10-CM | POA: Diagnosis present

## 2022-01-02 DIAGNOSIS — F17210 Nicotine dependence, cigarettes, uncomplicated: Secondary | ICD-10-CM | POA: Diagnosis present

## 2022-01-02 DIAGNOSIS — R262 Difficulty in walking, not elsewhere classified: Secondary | ICD-10-CM | POA: Diagnosis present

## 2022-01-02 DIAGNOSIS — E46 Unspecified protein-calorie malnutrition: Secondary | ICD-10-CM | POA: Diagnosis present

## 2022-01-02 DIAGNOSIS — I493 Ventricular premature depolarization: Secondary | ICD-10-CM | POA: Diagnosis present

## 2022-01-02 DIAGNOSIS — R112 Nausea with vomiting, unspecified: Secondary | ICD-10-CM

## 2022-01-02 LAB — COMPREHENSIVE METABOLIC PANEL
ALT: 20 U/L (ref 0–44)
AST: 33 U/L (ref 15–41)
Albumin: 2.4 g/dL — ABNORMAL LOW (ref 3.5–5.0)
Alkaline Phosphatase: 112 U/L (ref 38–126)
Anion gap: 13 (ref 5–15)
BUN: 11 mg/dL (ref 8–23)
CO2: 21 mmol/L — ABNORMAL LOW (ref 22–32)
Calcium: 8.2 mg/dL — ABNORMAL LOW (ref 8.9–10.3)
Chloride: 100 mmol/L (ref 98–111)
Creatinine, Ser: 0.87 mg/dL (ref 0.44–1.00)
GFR, Estimated: >60 mL/min (ref >60)
Glucose, Bld: 121 mg/dL — ABNORMAL HIGH (ref 70–99)
Potassium: 3.5 mmol/L (ref 3.5–5.1)
Sodium: 134 mmol/L — ABNORMAL LOW (ref 135–145)
Total Bilirubin: 0.8 mg/dL (ref 0.3–1.2)
Total Protein: 5.7 g/dL — ABNORMAL LOW (ref 6.5–8.1)

## 2022-01-02 LAB — CBC WITH DIFFERENTIAL/PLATELET
Abs Immature Granulocytes: 0.07 10*3/uL (ref 0.00–0.07)
Basophils Absolute: 0 10*3/uL (ref 0.0–0.1)
Basophils Relative: 0 %
Eosinophils Absolute: 0 10*3/uL (ref 0.0–0.5)
Eosinophils Relative: 0 %
HCT: 45.5 % (ref 36.0–46.0)
Hemoglobin: 15.6 g/dL — ABNORMAL HIGH (ref 12.0–15.0)
Immature Granulocytes: 0 %
Lymphocytes Relative: 15 %
Lymphs Abs: 3.2 10*3/uL (ref 0.7–4.0)
MCH: 31.8 pg (ref 26.0–34.0)
MCHC: 34.3 g/dL (ref 30.0–36.0)
MCV: 92.9 fL (ref 80.0–100.0)
Monocytes Absolute: 1.2 10*3/uL — ABNORMAL HIGH (ref 0.1–1.0)
Monocytes Relative: 6 %
Neutro Abs: 16.6 10*3/uL — ABNORMAL HIGH (ref 1.7–7.7)
Neutrophils Relative %: 79 %
Platelets: 260 10*3/uL (ref 150–400)
RBC: 4.9 MIL/uL (ref 3.87–5.11)
RDW: 13.2 % (ref 11.5–15.5)
WBC: 21.1 10*3/uL — ABNORMAL HIGH (ref 4.0–10.5)
nRBC: 0 % (ref 0.0–0.2)

## 2022-01-02 LAB — LIPASE, BLOOD: Lipase: 22 U/L (ref 11–51)

## 2022-01-02 LAB — LACTIC ACID, PLASMA
Lactic Acid, Venous: 2.3 mmol/L (ref 0.5–1.9)
Lactic Acid, Venous: 3 mmol/L (ref 0.5–1.9)

## 2022-01-02 LAB — MAGNESIUM: Magnesium: 1.5 mg/dL — ABNORMAL LOW (ref 1.7–2.4)

## 2022-01-02 MED ORDER — MAGNESIUM OXIDE -MG SUPPLEMENT 400 (240 MG) MG PO TABS
400.0000 mg | ORAL_TABLET | Freq: Once | ORAL | Status: AC
  Administered 2022-01-02: 400 mg via ORAL
  Filled 2022-01-02: qty 1

## 2022-01-02 MED ORDER — DILTIAZEM HCL-DEXTROSE 125-5 MG/125ML-% IV SOLN (PREMIX)
5.0000 mg/h | INTRAVENOUS | Status: DC
  Administered 2022-01-02: 5 mg/h via INTRAVENOUS
  Filled 2022-01-02: qty 125

## 2022-01-02 MED ORDER — ALUM & MAG HYDROXIDE-SIMETH 200-200-20 MG/5ML PO SUSP
15.0000 mL | Freq: Four times a day (QID) | ORAL | Status: DC | PRN
  Administered 2022-01-02 – 2022-01-04 (×3): 15 mL via ORAL
  Filled 2022-01-02 (×3): qty 30

## 2022-01-02 MED ORDER — METRONIDAZOLE 500 MG/100ML IV SOLN
500.0000 mg | Freq: Two times a day (BID) | INTRAVENOUS | Status: DC
  Administered 2022-01-02 – 2022-01-06 (×7): 500 mg via INTRAVENOUS
  Filled 2022-01-02 (×8): qty 100

## 2022-01-02 MED ORDER — SODIUM CHLORIDE 0.9 % IV BOLUS
1000.0000 mL | Freq: Once | INTRAVENOUS | Status: AC
  Administered 2022-01-02: 1000 mL via INTRAVENOUS

## 2022-01-02 MED ORDER — LORAZEPAM 2 MG/ML IJ SOLN
0.5000 mg | Freq: Once | INTRAMUSCULAR | Status: AC
  Administered 2022-01-02: 0.5 mg via INTRAVENOUS
  Filled 2022-01-02: qty 1

## 2022-01-02 MED ORDER — ACETAMINOPHEN 325 MG PO TABS
650.0000 mg | ORAL_TABLET | Freq: Four times a day (QID) | ORAL | Status: DC | PRN
  Administered 2022-01-03: 650 mg via ORAL
  Filled 2022-01-02: qty 2

## 2022-01-02 MED ORDER — OXYCODONE HCL 5 MG PO TABS
5.0000 mg | ORAL_TABLET | ORAL | Status: DC | PRN
  Administered 2022-01-02 – 2022-01-06 (×5): 5 mg via ORAL
  Filled 2022-01-02 (×5): qty 1

## 2022-01-02 MED ORDER — ENOXAPARIN SODIUM 40 MG/0.4ML IJ SOSY
40.0000 mg | PREFILLED_SYRINGE | INTRAMUSCULAR | Status: DC
  Administered 2022-01-02 – 2022-01-06 (×5): 40 mg via SUBCUTANEOUS
  Filled 2022-01-02 (×5): qty 0.4

## 2022-01-02 MED ORDER — ACETAMINOPHEN 650 MG RE SUPP: 650.0000 mg | Freq: Four times a day (QID) | RECTAL | Status: DC | PRN

## 2022-01-02 MED ORDER — LACTATED RINGERS IV BOLUS (SEPSIS)
1000.0000 mL | Freq: Once | INTRAVENOUS | Status: AC
Start: 2022-01-02 — End: 2022-01-02
  Administered 2022-01-02: 1000 mL via INTRAVENOUS

## 2022-01-02 MED ORDER — IOHEXOL 300 MG/ML  SOLN
100.0000 mL | Freq: Once | INTRAMUSCULAR | Status: AC | PRN
  Administered 2022-01-02: 100 mL via INTRAVENOUS

## 2022-01-02 MED ORDER — DILTIAZEM LOAD VIA INFUSION
15.0000 mg | Freq: Once | INTRAVENOUS | Status: DC
  Filled 2022-01-02: qty 15

## 2022-01-02 MED ORDER — PROCHLORPERAZINE EDISYLATE 10 MG/2ML IJ SOLN
10.0000 mg | Freq: Four times a day (QID) | INTRAMUSCULAR | Status: DC | PRN
  Administered 2022-01-03 – 2022-01-05 (×3): 10 mg via INTRAVENOUS
  Filled 2022-01-02 (×4): qty 2

## 2022-01-02 MED ORDER — SODIUM CHLORIDE (PF) 0.9 % IJ SOLN
INTRAMUSCULAR | Status: AC
  Filled 2022-01-02: qty 50

## 2022-01-02 MED ORDER — ZINC OXIDE 40 % EX OINT
TOPICAL_OINTMENT | Freq: Every day | CUTANEOUS | Status: DC
  Administered 2022-01-05 – 2022-01-06 (×2): 1 via TOPICAL
  Filled 2022-01-02 (×4): qty 57

## 2022-01-02 MED ORDER — ONDANSETRON HCL 4 MG/2ML IJ SOLN
4.0000 mg | Freq: Once | INTRAMUSCULAR | Status: AC
  Administered 2022-01-02: 4 mg via INTRAVENOUS
  Filled 2022-01-02: qty 2

## 2022-01-02 MED ORDER — LACTATED RINGERS IV SOLN: INTRAVENOUS | Status: DC

## 2022-01-02 MED ORDER — LACTATED RINGERS IV BOLUS
1000.0000 mL | Freq: Once | INTRAVENOUS | Status: AC
Start: 2022-01-02 — End: 2022-01-02
  Administered 2022-01-02: 1000 mL via INTRAVENOUS

## 2022-01-02 MED ORDER — SODIUM CHLORIDE 0.9 % IV SOLN
2.0000 g | INTRAVENOUS | Status: DC
  Administered 2022-01-02 – 2022-01-05 (×4): 2 g via INTRAVENOUS
  Filled 2022-01-02 (×4): qty 20

## 2022-01-02 NOTE — Progress Notes (Addendum)
   01/02/22 1820  Provider Notification  Provider Name/Title Marylyn Ishihara DO  Date Provider Notified 01/02/22  Time Provider Notified 0698  Method of Notification Page  Notification Reason Change in status;Critical result;Other (Comment) (converted to Afib, HR 120-130s)  Test performed and critical result Lactic Acid 3.0  Date Critical Result Received 01/02/22  Time Critical Result Received 1818   See new orders

## 2022-01-02 NOTE — ED Triage Notes (Signed)
Pt BIB EMS from home, c/o N/V and diarrhea since Wednesday. No blood in stool noted. Per EMS, red rash on buttocks. A&O x 4, denies any abdominal pain.   BP 112/80 P 70 spO2 100% CBG 130

## 2022-01-02 NOTE — ED Provider Notes (Signed)
  Face-to-face evaluation   History: Presents for ongoing vomiting and diarrhea for 3 days, with anorexia.  She thinks she has food poisoning from eating a can of [employer]el.  She denies blood in emesis or stool.  She denies fever.  She has a history of atrial fibrillation but does not take medications for it.  Physical exam: Elderly, appears older than stated age.  She appears clinically dehydrated with dry lips and oral mucous membranes.  The abdomen is diffusely tender, mildly.  There is no rebound tenderness or palpable mass  MDM: Evaluation for  Chief Complaint  Patient presents with   Diarrhea   Emesis     Patient with ongoing diarrhea and vomiting presenting with abdominal pain.  She has tachycardia and history of atrial fibrillation.  EKG, x2 indicate sinus tachycardia with sinus arrhythmia and occasional PVC.  No clear evidence for atrial fibrillation on EKG.  On cardiac monitor, there is more suggestion for atrial fibrillation with intermittent irregularity.  Occasionally the P wave morphology changes.  The predominant conduction however is atrial to ventricular, and appears to be sinus rhythm.  She is persistently tachycardic.  Medical screening examination/treatment/procedure(s) were conducted as a shared visit with non-physician practitioner(s) and myself.  I personally evaluated the patient during the encounter    Daleen Bo, MD 01/02/22 [phone]

## 2022-01-02 NOTE — H&P (Signed)
History and Physical    Patient: Karen Casey LXB:[id number] DOB: Nov 16, 1955 DOA: 01/02/2022 DOS: the patient was seen and examined on 01/02/2022 PCP: [name], MD  Patient coming from: Home  Chief Complaint:  Chief Complaint  Patient presents with   Diarrhea   Emesis   HPI: Karen Casey is a 66 y.o. female with medical history significant of GERD, PAF, EtOH abuse, tobacco abuse, COPD, anxiety/depression. Presenting with 3 days of diarrhea. She has not had any fevers. She has had nausea and vomiting. She has tried imodium and nexium for her symptoms, but it has not brought full relief. She states she feels like her reflux is worsening. She denies any sick contacts. She says she may have eaten some bad food; however, her husband has not showed similar symptoms. When her symptoms did not improve, she decided to come to the ED for assistance. She denies any other aggravating or alleviating factors.  Review of Systems: As mentioned in the history of present illness. All other systems reviewed and are negative. 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   Seizure ([place])    08/2020 per patient   Seizures ([place])    xanax withdrawl- December 2013   Urinary incontinence 06/09/2012   Past Surgical History:  Procedure Laterality Date   APPENDECTOMY     BIOPSY  01/04/2021   Procedure: BIOPSY;  Surgeon: Yetta Flock, MD;  Location: [facility];  Service: Gastroenterology;;   BIOPSY  03/12/2021   Procedure: BIOPSY;  Surgeon: Irving Copas., MD;  Location: [place];  Service: Gastroenterology;;   CATARACT EXTRACTION  06/09/2012   Left eye   COLONOSCOPY WITH PROPOFOL N/A 03/12/2021   Procedure: COLONOSCOPY WITH PROPOFOL;  Surgeon: Irving Copas., MD;  Location: [facility];  Service: Gastroenterology;  Laterality: N/A;   ESOPHAGOGASTRODUODENOSCOPY (EGD) WITH PROPOFOL N/A 01/04/2021   Procedure: ESOPHAGOGASTRODUODENOSCOPY (EGD) WITH PROPOFOL;  Surgeon: Yetta Flock, MD;  Location: [facility];  Service: Gastroenterology;  Laterality: N/A;   left shoulder dislocation  Sept 2011   POLYPECTOMY  03/12/2021   Procedure: POLYPECTOMY;  Surgeon: Mansouraty, Telford Nab., MD;  Location: [facility];  Service: Gastroenterology;;   Social History:  reports that she has been smoking cigarettes. She has a 21.50 pack-year smoking history. She has never used smokeless tobacco. She reports current alcohol use of about 3.0 standard drinks of alcohol per week. She reports current drug use. Drugs: Marijuana and Other-see comments.  Allergies  Allergen Reactions   Capsaicin-Menthol Other (See Comments)    Burning and peeling on area applied to   Diclo Gel [Diclofenac Sodium] Other (See Comments)    GEL AND CREAM-burning and peeling at the sight of application.    Family History  Problem Relation Age of Onset   Hypertension Mother    Hyperlipidemia Mother  Aneurysm Mother        Rupture - Cause of death   Heart disease Father        MI - cause of death   Depression Father    Parkinsonism Father    Hypertension Sister    Colon cancer Neg Hx    Esophageal cancer Neg Hx    Rectal cancer Neg Hx    Stomach cancer Neg Hx     Prior to Admission medications   Medication Sig Start Date End Date Taking? Authorizing Provider  albuterol (VENTOLIN HFA) 108 (90 Base) MCG/ACT inhaler Inhale 2 puffs into the lungs  every 6 (six) hours as needed for wheezing or shortness of breath. 09/23/21  Yes Pray, Norwood Levo, MD  busPIRone (BUSPAR) 10 MG tablet TAKE 1 TABLET BY MOUTH THREE TIMES A DAY Patient taking differently: Take 10 mg by mouth daily in the afternoon. 11/16/21  Yes Pray, Norwood Levo, MD  CALCIUM-MAGNESIUM-ZINC PO Take 1 tablet by mouth daily.   Yes [provider]  Cholecalciferol (VITAMIN D3 PO) Take 1 capsule by mouth daily.   Yes [provider]  feeding supplement (ENSURE ENLIVE / ENSURE PLUS) LIQD Take 237 mLs by mouth 2 (two) times daily between meals. Patient taking differently: Take 237 mLs by mouth daily. 01/06/21  Yes Alen Bleacher, MD  gabapentin (NEURONTIN) 600 MG tablet TAKE 1 TABLET BY MOUTH THREE TIMES A DAY Patient taking differently: Take by mouth 3 (three) times daily. 12/02/21  Yes Pray, Norwood Levo, MD  Multiple Vitamins-Minerals (CENTRUM SILVER 50+WOMEN PO) Take 1 tablet by mouth daily.   Yes [provider]  nortriptyline (PAMELOR) 25 MG capsule TAKE 2 CAPSULES (50 MG TOTAL) BY MOUTH EVERY DAY AT BEDTIME Patient taking differently: Take 75 mg by mouth at bedtime. 11/10/21  Yes Pray, Norwood Levo, MD  acetaminophen (TYLENOL) 650 MG CR tablet Take 1,950 mg by mouth 2 (two) times daily as needed for pain.    [provider]  amLODipine (NORVASC) 5 MG tablet Take 5 mg by mouth daily. 12/14/21   [provider]  ANORO ELLIPTA 62.5-25 MCG/ACT AEPB INHALE 1 PUFF BY MOUTH EVERY DAY Patient taking differently: Inhale 1 puff into the lungs every morning. 06/09/21   Martyn Malay, MD  famotidine (PEPCID) 20 MG tablet Take 20 mg by mouth 2 (two) times daily as needed for heartburn or indigestion.    [provider]  folic acid (FOLVITE) 1 MG tablet Take 1 tablet (1 mg total) by mouth daily. 12/02/21   Georgette Shell, MD  nicotine (NICODERM CQ - DOSED IN MG/24 HOURS) 21 mg/24hr patch Place 1 patch (21 mg total) onto the skin daily. Patient not  taking: Reported on 01/02/2022 12/02/21   Georgette Shell, MD  nitrofurantoin, macrocrystal-monohydrate, (MACROBID) 100 MG capsule Take 100 mg by mouth 2 (two) times daily. Patient not taking: Reported on 01/02/2022 12/14/21   [provider]  ondansetron (ZOFRAN) 4 MG tablet Take 4 mg by mouth every 6 (six) hours as needed. 12/14/21   [provider]  ondansetron (ZOFRAN-ODT) 4 MG disintegrating tablet Take 1 tablet (4 mg total) by mouth every 8 (eight) hours as needed for nausea or vomiting. 12/11/21   Loni Beckwith, PA-C  OXYGEN Inhale into the lungs as needed.    [provider]  pantoprazole (PROTONIX) 40 MG tablet Take 1 tablet (40 mg total) by mouth every morning. 10/22/21   Pray, Norwood Levo, MD    Physical Exam:  Vitals:   01/02/22 0928 01/02/22 1038 01/02/22 1130  BP: 118/67 118/84 137/78  Pulse: (!) 55 (!) 118 90  Resp: '18 16 11  ' Temp: (!) 97.2 F (36.2 C)    TempSrc: Oral    SpO2: 100% 100% 98%  Weight: 74.8 kg    Height: '5\' 3"'  (1.6 m)     General: 66 y.o. female resting in bed in NAD Eyes: PERRL, normal sclera ENMT: Nares patent w/o discharge, orophaynx clear, dentition poor, ears w/o discharge/lesions/ulcers Neck: Supple, trachea midline Cardiovascular: tachy, +S1, S2, no m/g/r, equal pulses throughout Respiratory: exp wheeze, soft rhonchi, normal WOB on RA GI: BS+, NDNT, no masses noted, no organomegaly noted MSK: No e/c/c Neuro: A&O x 3, no focal deficits Psyc: Appropriate interaction and affect, calm/cooperative  Data Reviewed:  Lab Results  Component Value Date   NA 134 (L) 01/02/2022   K 3.5 01/02/2022   CO2 21 (L) 01/02/2022   GLUCOSE 121 (H) 01/02/2022   BUN 11 01/02/2022   CREATININE 0.87 01/02/2022   CALCIUM 8.2 (L) 01/02/2022   EGFR 85 07/03/2021   GFRNONAA >60 01/02/2022   Lab Results  Component Value Date   WBC 21.1 (H) 01/02/2022   HGB 15.6 (H) 01/02/2022   HCT 45.5 01/02/2022   MCV 92.9 01/02/2022   PLT 260  01/02/2022   CT ab/pelvis There is mild dilation of small bowel loops along with fluid in the lumen. There is wall thickening in ascending colon. There is liquid stool in colon. Findings suggest enterocolitis. There is no evidence of intestinal obstruction or pneumoperitoneum. There is no hydronephrosis.   Fatty liver.  Small hiatal hernia.  Assessment and Plan: Sepsis Nausea, diarrhea     - admit to inpt, progressive     - likely GI source, checking c diff, GI PCR     - check Ucx, Bld Cx as well     - rocephin/flagyl, fluids     - follow lactic acid  Hx of PAF     - she's in sinus tach right now     - she is not on chronic anticoagulation d/t high fall risk per old records  Hypomagnesemia     - replace Mg2+; K+ is ok  Moderate protein calorie malnutrition     - dietitian consult  GERD     - hold PPI until we get results on stool studies  COPD     - continue home regimen when confirmed  HTN     - continue home regimen when confirmed  Anxiety Depression     - continue home regimen when confirmed  Hx of EtOH abuse Hx of tobacco abuse     - denies any use of either EtOH or tobacco in months  Advance Care Planning:   Code Status: FULL  Consults: None  Family Communication: None at bedside  Severity of Illness: The appropriate patient status for this patient is OBSERVATION. Observation status is judged to be reasonable and necessary in order to provide the required intensity of service to ensure the patient's safety. The patient's presenting symptoms, physical exam findings, and initial radiographic and laboratory data in the context of their medical condition is felt to place them at decreased risk for further clinical deterioration. Furthermore, it is anticipated that the patient will be medically stable for discharge from the hospital within 2 midnights of admission.   Author: Jonnie Finner, DO 01/02/2022 1:14 PM  For on call review [email or web address].

## 2022-01-02 NOTE — ED Provider Notes (Signed)
[facility] DEPT Provider Note   CSN: [id number] Arrival date & time: 01/02/22  9937     History  Chief Complaint  Patient presents with   Diarrhea   Emesis    Karen Casey is a 66 y.o. female with a past medical history of GERD presenting today with abdominal pain, nausea, vomiting and diarrhea.  She states the diarrhea started on Wednesday and has been constant.  Reports around 20 episodes a day.  She threw up 1 time on Friday but has not had any vomiting since.  She says that this happens when she has a flareup of her GERD.  Reports being out of her Nexium for the past 2 days but the symptoms started prior to running out of the medication.  Denies any melena/hematochezia or hematemesis.  Feels lightheaded and has not been able to hold down any food or drink.  Reports she took Imodium this morning which helped her diarrhea.  She is taking it over the past few days as well with success.  No recent travel, changes in diet or antibiotic use.  No urinary symptoms.   Diarrhea Associated symptoms: abdominal pain and vomiting   Associated symptoms: no fever   Emesis Associated symptoms: abdominal pain and diarrhea   Associated symptoms: no fever        Home Medications Prior to Admission medications   Medication Sig Start Date End Date Taking? Authorizing Provider  acetaminophen (TYLENOL) 650 MG CR tablet Take 1,950 mg by mouth 2 (two) times daily as needed for pain.    [provider]  albuterol (VENTOLIN HFA) 108 (90 Base) MCG/ACT inhaler Inhale 2 puffs into the lungs every 6 (six) hours as needed for wheezing or shortness of breath. 09/23/21   [name], MD  ANORO ELLIPTA 62.5-25 MCG/ACT AEPB INHALE 1 PUFF BY MOUTH EVERY DAY Patient taking differently: Inhale 1 puff into the lungs every morning. 06/09/21   Martyn Malay, MD  busPIRone (BUSPAR) 10 MG tablet TAKE 1 TABLET BY MOUTH THREE TIMES A DAY Patient taking differently: Take 10  mg by mouth 3 (three) times daily. 11/16/21   [name], MD  CALCIUM-MAGNESIUM-ZINC PO Take 1 tablet by mouth daily.    [provider]  Cholecalciferol (VITAMIN D3 PO) Take 1 capsule by mouth daily.    [provider]  famotidine (PEPCID) 20 MG tablet Take 20 mg by mouth 2 (two) times daily as needed for heartburn or indigestion.    [provider]  feeding supplement (ENSURE ENLIVE / ENSURE PLUS) LIQD Take 237 mLs by mouth 2 (two) times daily between meals. Patient taking differently: Take 237 mLs by mouth daily as needed (meal supplement). 01/06/21   Alen Bleacher, MD  folic acid (FOLVITE) 1 MG tablet Take 1 tablet (1 mg total) by mouth daily. 12/02/21   Georgette Shell, MD  gabapentin (NEURONTIN) 600 MG tablet TAKE 1 TABLET BY MOUTH THREE TIMES A DAY 12/02/21   [name], MD  Multiple Vitamins-Minerals (CENTRUM SILVER 50+WOMEN PO) Take 1 tablet by mouth daily.    [provider]  nicotine (NICODERM CQ - DOSED IN MG/24 HOURS) 21 mg/24hr patch Place 1 patch (21 mg total) onto the skin daily. 12/02/21   Georgette Shell, MD  nortriptyline (PAMELOR) 25 MG capsule TAKE 2 CAPSULES (50 MG TOTAL) BY MOUTH EVERY DAY AT BEDTIME Patient taking differently: Take 75 mg by mouth at bedtime. 11/10/21   [name], MD  ondansetron (ZOFRAN-ODT) 4 MG disintegrating tablet Take 1 tablet (4 mg total) by mouth every 8 (eight) hours as needed for nausea or vomiting. 12/11/21   Loni Beckwith, PA-C  OXYGEN Inhale into the lungs as needed.    [provider]  pantoprazole (PROTONIX) 40 MG tablet Take 1 tablet (40 mg total) by mouth every morning. 10/22/21   Pray, Norwood Levo, MD      Allergies    Capsaicin-menthol and Diclo gel [diclofenac sodium]    Review of Systems   Review of Systems  Constitutional:  Positive for fatigue. Negative for fever.  Respiratory:  Negative for chest tightness and shortness of breath.   Cardiovascular:  Positive  for palpitations. Negative for chest pain.  Gastrointestinal:  Positive for abdominal pain, diarrhea, nausea and vomiting. Negative for blood in stool and constipation.  Neurological:  Positive for weakness.    Physical Exam Updated Vital Signs BP 118/67   Pulse (!) 55   Temp (!) 97.2 F (36.2 C) (Oral)   Resp 18   Ht '5\' 3"'$  (1.6 m)   Wt 74.8 kg   SpO2 100%   BMI 29.23 kg/m  Physical Exam Vitals and nursing note reviewed.  Constitutional:      General: She is not in acute distress.    Appearance: Normal appearance. She is ill-appearing.  HENT:     Head: Normocephalic and atraumatic.     Mouth/Throat:     Mouth: Mucous membranes are dry.     Pharynx: Oropharynx is clear.     Comments: Extremely dry mucous membranes Eyes:     General: No scleral icterus.    Conjunctiva/sclera: Conjunctivae normal.  Cardiovascular:     Rate and Rhythm: Regular rhythm. Tachycardia present.  Pulmonary:     Effort: Pulmonary effort is normal. No respiratory distress.  Abdominal:     General: Abdomen is flat.     Palpations: Abdomen is soft.     Comments: Generalized abdominal tenderness, soft and nondistended  Skin:    General: Skin is warm and dry.     Coloration: Skin is pale.     Findings: No rash.  Neurological:     Mental Status: She is alert.  Psychiatric:        Mood and Affect: Mood normal.     ED Results / Procedures / Treatments   Labs (all labs ordered are listed, but only abnormal results are displayed) Labs Reviewed  MAGNESIUM - Abnormal; Notable for the following components:      Result Value   Magnesium 1.5 (*)    All other components within normal limits  COMPREHENSIVE METABOLIC PANEL - Abnormal; Notable for the following components:   Sodium 134 (*)    CO2 21 (*)    Glucose, Bld 121 (*)    Calcium 8.2 (*)    Total Protein 5.7 (*)    Albumin 2.4 (*)    All other components within normal limits  CBC WITH DIFFERENTIAL/PLATELET - Abnormal; Notable for the  following components:   WBC 21.1 (*)    Hemoglobin 15.6 (*)    Neutro Abs 16.6 (*)    Monocytes Absolute 1.2 (*)    All other components within normal limits  LIPASE, BLOOD  URINALYSIS, ROUTINE W REFLEX MICROSCOPIC  LACTIC ACID, PLASMA  LACTIC ACID, PLASMA    EKG EKG Interpretation  Date/Time:  Saturday January 02 2022 11:48:32 EDT Ventricular Rate:  122 PR Interval:  147 QRS Duration: 75 QT Interval:  302 QTC  Calculation: 431 R Axis:   51 Text Interpretation: Sinus tachycardia Multiple premature complexes, vent & supraven Anteroseptal infarct, age indeterminate Borderline repolarization abnormality Since last tracing of earlier today pvc present Otherwise no significant change Confirmed by Daleen Bo [phone]) on 01/02/2022 11:58:23 AM  Radiology CT ABDOMEN PELVIS W CONTRAST  Result Date: 01/02/2022 CLINICAL DATA:  Abdominal pain, nausea, vomiting, diarrhea EXAM: CT ABDOMEN AND PELVIS WITH CONTRAST TECHNIQUE: Multidetector CT imaging of the abdomen and pelvis was performed using the standard protocol following bolus administration of intravenous contrast. RADIATION DOSE REDUCTION: This exam was performed according to the departmental dose-optimization program which includes automated exposure control, adjustment of the mA and/or kV according to patient size and/or use of iterative reconstruction technique. CONTRAST:  133m OMNIPAQUE IOHEXOL 300 MG/ML  SOLN COMPARISON:  12/11/2021 FINDINGS: Lower chest: Small linear densities in the lingula suggest scarring or subsegmental atelectasis. Hepatobiliary: There is marked fatty infiltration of the liver. In image 20 of series 2, there is a 8 mm low-density, possibly a cyst. There is no dilation of bile ducts. Gallbladder is unremarkable. Pancreas: Pancreas appears smaller than usual in size. No focal abnormalities are seen. Spleen: Unremarkable. Adrenals/Urinary Tract: Adrenals are not enlarged. There is no hydronephrosis. There are no renal or  ureteral stones. Urinary bladder is not distended. There is possible subcentimeter cyst in the upper pole of left kidney. Stomach/Bowel: Small hiatal hernia is seen. Stomach is unremarkable. There is mild dilation of small bowel loops. There is fluid in the lumen of small bowel loops. Appendix is not seen. There is mild diffuse wall thickening in ascending colon. There is incomplete distention of ascending colon. Liquid stool is seen in colon. Vascular/Lymphatic: Scattered atherosclerotic plaques and calcifications are seen. Reproductive: Unremarkable. Other: Trace amount of free fluid is seen in pelvis. There is no pneumoperitoneum. Musculoskeletal: Last lumbar vertebra is transitional. Degenerative changes are noted at the L4-L5 level with encroachment of neural foramina. IMPRESSION: There is mild dilation of small bowel loops along with fluid in the lumen. There is wall thickening in ascending colon. There is liquid stool in colon. Findings suggest enterocolitis. There is no evidence of intestinal obstruction or pneumoperitoneum. There is no hydronephrosis. Fatty liver.  Small hiatal hernia. Other findings as described in the body of the report. Electronically Signed   By: PElmer PickerM.D.   On: 01/02/2022 12:20    Procedures Procedures   Medications Ordered in ED Medications  sodium chloride 0.9 % bolus 1,000 mL (has no administration in time range)  ondansetron (ZOFRAN) injection 4 mg (has no administration in time range)  LORazepam (ATIVAN) injection 0.5 mg (has no administration in time range)    ED Course/ Medical Decision Making/ A&P                           Medical Decision Making Amount and/or Complexity of Data Reviewed Labs: ordered. Radiology: ordered.  Risk OTC drugs. Prescription drug management. Decision regarding hospitalization.   This patient presents to the ED for concern of abdominal pain, nausea, vomiting and diarrhea.  Differential includes but is not  limited to pancreatitis, gastroenteritis, colitis, viral illness   This is not an exhaustive differential.    Past Medical History / Co-morbidities / Social History: Recurrent abdominal pain, nausea and vomiting.   Additional history: Per chart review patient was here for the same thing last month.  Was discharged home after successful treatment.   Physical Exam: Pertinent physical exam  findings include very pale mucous membranes, very clearly dehydrated.  Tachycardic  Lab Tests: I ordered, and personally interpreted labs.  The pertinent results include:  Leukocytosis 21.1, magnesium 1.5, albumin 2.4, total protein 5.7   Imaging Studies: I ordered and independently visualized and interpreted CT abdomen pelvis which showed colitis. I agree with the radiologist interpretation.   Cardiac Monitoring:  The patient was maintained on a cardiac monitor.  My attending physician Dr. Eulis Foster viewed and interpreted the cardiac monitored which showed an underlying rhythm of: Originally patient was in sinus tachycardia.  When I was at the bedside I reassessed her and she appeared to be in A-fib on the monitor.  This was consistent with auscultation.  I shot a second EKG and showed it to my attending physician.  Originally thought to be sinus arrhythmia however after being assessed at bedside she again appears to be in A-fib.  She reports a remote history of A-fib.  She was taken off anticoagulation and never on rate control meds.  Has not seen a cardiologist in many years, she was told she did not need to.   Medications: I ordered medication including Zofran, Ativan, Mag-Ox and fluid resuscitation. Reevaluation of the patient after these medicines showed that the patient improved. I have reviewed the patients home medicines and have made adjustments as needed.  Disposition: 66 year old female presenting with 4 days worth of nausea, vomiting and diarrhea.  Work-up consistent with colitis.  She is  tachycardic and clearly volume down.  We will continue to fluid resuscitate however patient requires admission for her colitis, dehydration, leukocytosis to 21.1 and inability to tolerate p.o. intake.  Hospitalist paged..    I discussed this case with my attending physician Dr. Eulis Foster who cosigned this note including patient's presenting symptoms, physical exam, and planned diagnostics and interventions. Attending physician stated agreement with plan or made changes to plan which were implemented.     Final Clinical Impression(s) / ED Diagnoses Final diagnoses:  Colitis    Rx / DC Orders Admit to Dr. Nyra Jabs, [place], PA-C 01/02/22 1320    Daleen Bo, MD 01/02/22 1702

## 2022-01-02 NOTE — Progress Notes (Signed)
   01/02/22 1559  Assess: MEWS Score  Temp 97.8 F (36.6 C)  BP 126/86  MAP (mmHg) 99  Pulse Rate (!) 120  Resp 20  SpO2 94 %  O2 Device Room Air  Assess: MEWS Score  MEWS Temp 0  MEWS Systolic 0  MEWS Pulse 2  MEWS RR 0  MEWS LOC 0  MEWS Score 2  MEWS Score Color Yellow  Assess: if the MEWS score is Yellow or Red  Were vital signs taken at a resting state? Yes  Focused Assessment No change from prior assessment  Does the patient meet 2 or more of the SIRS criteria? Yes  Does the patient have a confirmed or suspected source of infection? Yes  Provider and Rapid Response Notified? No  MEWS guidelines implemented *See Row Information* Yes (previous yellow in ED , MD aware)  Treat  MEWS Interventions Escalated (See documentation below)  Take Vital Signs  Increase Vital Sign Frequency  Yellow: Q 2hr X 2 then Q 4hr X 2, if remains yellow, continue Q 4hrs  Escalate  MEWS: Escalate Yellow: discuss with charge nurse/RN and consider discussing with provider and RRT  Notify: Charge Nurse/RN  Name of Charge Nurse/RN Notified Lance RN  Date Charge Nurse/RN Notified 01/02/22  Time Charge Nurse/RN Notified 1613  Assess: SIRS CRITERIA  SIRS Temperature  0  SIRS Pulse 1  SIRS Respirations  0  SIRS WBC 1  SIRS Score Sum  2

## 2022-01-03 DIAGNOSIS — I480 Paroxysmal atrial fibrillation: Secondary | ICD-10-CM

## 2022-01-03 DIAGNOSIS — J449 Chronic obstructive pulmonary disease, unspecified: Secondary | ICD-10-CM

## 2022-01-03 DIAGNOSIS — K529 Noninfective gastroenteritis and colitis, unspecified: Secondary | ICD-10-CM | POA: Diagnosis not present

## 2022-01-03 DIAGNOSIS — E876 Hypokalemia: Secondary | ICD-10-CM

## 2022-01-03 LAB — CBC WITH DIFFERENTIAL/PLATELET
Abs Immature Granulocytes: 0.04 10*3/uL (ref 0.00–0.07)
Basophils Absolute: 0 10*3/uL (ref 0.0–0.1)
Basophils Relative: 0 %
Eosinophils Absolute: 0.1 10*3/uL (ref 0.0–0.5)
Eosinophils Relative: 1 %
HCT: 34.7 % — ABNORMAL LOW (ref 36.0–46.0)
Hemoglobin: 11.9 g/dL — ABNORMAL LOW (ref 12.0–15.0)
Immature Granulocytes: 0 %
Lymphocytes Relative: 16 %
Lymphs Abs: 2 10*3/uL (ref 0.7–4.0)
MCH: 32.5 pg (ref 26.0–34.0)
MCHC: 34.3 g/dL (ref 30.0–36.0)
MCV: 94.8 fL (ref 80.0–100.0)
Monocytes Absolute: 0.7 10*3/uL (ref 0.1–1.0)
Monocytes Relative: 6 %
Neutro Abs: 9.3 10*3/uL — ABNORMAL HIGH (ref 1.7–7.7)
Neutrophils Relative %: 77 %
Platelets: 153 10*3/uL (ref 150–400)
RBC: 3.66 MIL/uL — ABNORMAL LOW (ref 3.87–5.11)
RDW: 13.2 % (ref 11.5–15.5)
WBC: 12.1 10*3/uL — ABNORMAL HIGH (ref 4.0–10.5)
nRBC: 0 % (ref 0.0–0.2)

## 2022-01-03 LAB — COMPREHENSIVE METABOLIC PANEL
ALT: 17 U/L (ref 0–44)
AST: 21 U/L (ref 15–41)
Albumin: 1.8 g/dL — ABNORMAL LOW (ref 3.5–5.0)
Alkaline Phosphatase: 87 U/L (ref 38–126)
Anion gap: 8 (ref 5–15)
BUN: 8 mg/dL (ref 8–23)
CO2: 23 mmol/L (ref 22–32)
Calcium: 7.9 mg/dL — ABNORMAL LOW (ref 8.9–10.3)
Chloride: 107 mmol/L (ref 98–111)
Creatinine, Ser: 0.63 mg/dL (ref 0.44–1.00)
GFR, Estimated: >60 mL/min (ref >60)
Glucose, Bld: 84 mg/dL (ref 70–99)
Potassium: 2.6 mmol/L — CL (ref 3.5–5.1)
Sodium: 138 mmol/L (ref 135–145)
Total Bilirubin: 0.7 mg/dL (ref 0.3–1.2)
Total Protein: 4.5 g/dL — ABNORMAL LOW (ref 6.5–8.1)

## 2022-01-03 LAB — C DIFFICILE QUICK SCREEN W PCR REFLEX
C Diff antigen: NEGATIVE
C Diff interpretation: NOT DETECTED
C Diff toxin: NEGATIVE

## 2022-01-03 LAB — PROTIME-INR
INR: 1.4 — ABNORMAL HIGH (ref 0.8–1.2)
Prothrombin Time: 17.2 seconds — ABNORMAL HIGH (ref 11.4–15.2)

## 2022-01-03 LAB — CORTISOL: Cortisol, Plasma: 21.3 ug/dL

## 2022-01-03 MED ORDER — ALBUTEROL SULFATE (2.5 MG/3ML) 0.083% IN NEBU: 2.5000 mg | INHALATION_SOLUTION | Freq: Four times a day (QID) | RESPIRATORY_TRACT | Status: DC | PRN

## 2022-01-03 MED ORDER — BUSPIRONE HCL 5 MG PO TABS
10.0000 mg | ORAL_TABLET | Freq: Every day | ORAL | Status: DC
Start: 2022-01-03 — End: 2022-01-06
  Administered 2022-01-03 – 2022-01-06 (×4): 10 mg via ORAL
  Filled 2022-01-03 (×4): qty 2

## 2022-01-03 MED ORDER — FLUTICASONE FUROATE-VILANTEROL 100-25 MCG/ACT IN AEPB
1.0000 | INHALATION_SPRAY | Freq: Every day | RESPIRATORY_TRACT | Status: DC
  Administered 2022-01-03 – 2022-01-07 (×5): 1 via RESPIRATORY_TRACT
  Filled 2022-01-03: qty 28

## 2022-01-03 MED ORDER — POTASSIUM CHLORIDE CRYS ER 20 MEQ PO TBCR
40.0000 meq | EXTENDED_RELEASE_TABLET | ORAL | Status: AC
  Administered 2022-01-03 (×3): 40 meq via ORAL
  Filled 2022-01-03 (×3): qty 2

## 2022-01-03 MED ORDER — UMECLIDINIUM BROMIDE 62.5 MCG/ACT IN AEPB
1.0000 | INHALATION_SPRAY | Freq: Every day | RESPIRATORY_TRACT | Status: DC
  Administered 2022-01-03 – 2022-01-07 (×5): 1 via RESPIRATORY_TRACT
  Filled 2022-01-03: qty 7

## 2022-01-03 MED ORDER — GABAPENTIN 300 MG PO CAPS
600.0000 mg | ORAL_CAPSULE | Freq: Three times a day (TID) | ORAL | Status: DC
  Administered 2022-01-03 – 2022-01-07 (×14): 600 mg via ORAL
  Filled 2022-01-03 (×14): qty 2

## 2022-01-03 MED ORDER — ENSURE ENLIVE PO LIQD
237.0000 mL | Freq: Every day | ORAL | Status: DC
  Administered 2022-01-03 – 2022-01-07 (×5): 237 mL via ORAL

## 2022-01-03 MED ORDER — NORTRIPTYLINE HCL 25 MG PO CAPS
75.0000 mg | ORAL_CAPSULE | Freq: Every day | ORAL | Status: DC
  Administered 2022-01-03 – 2022-01-06 (×4): 75 mg via ORAL
  Filled 2022-01-03 (×5): qty 3

## 2022-01-03 MED ORDER — MAGNESIUM SULFATE 2 GM/50ML IV SOLN
2.0000 g | Freq: Once | INTRAVENOUS | Status: AC
  Administered 2022-01-03: 2 g via INTRAVENOUS
  Filled 2022-01-03: qty 50

## 2022-01-03 MED ORDER — PANTOPRAZOLE SODIUM 40 MG PO TBEC
40.0000 mg | DELAYED_RELEASE_TABLET | Freq: Every morning | ORAL | Status: DC
Start: 2022-01-03 — End: 2022-01-07
  Administered 2022-01-03 – 2022-01-07 (×5): 40 mg via ORAL
  Filled 2022-01-03 (×5): qty 1

## 2022-01-03 MED ORDER — POLYVINYL ALCOHOL 1.4 % OP SOLN
1.0000 [drp] | OPHTHALMIC | Status: DC | PRN
  Administered 2022-01-03 – 2022-01-04 (×4): 1 [drp] via OPHTHALMIC
  Filled 2022-01-03: qty 15

## 2022-01-03 MED ORDER — POTASSIUM CHLORIDE IN NACL 20-0.45 MEQ/L-% IV SOLN
INTRAVENOUS | Status: DC
  Filled 2022-01-03 (×4): qty 1000

## 2022-01-03 MED ORDER — ADULT MULTIVITAMIN W/MINERALS CH
1.0000 | ORAL_TABLET | Freq: Every day | ORAL | Status: DC
  Administered 2022-01-03 – 2022-01-07 (×5): 1 via ORAL
  Filled 2022-01-03 (×5): qty 1

## 2022-01-03 MED ORDER — POTASSIUM CHLORIDE 10 MEQ/100ML IV SOLN
10.0000 meq | INTRAVENOUS | Status: DC
  Administered 2022-01-03: 10 meq via INTRAVENOUS
  Filled 2022-01-03: qty 100

## 2022-01-03 MED ORDER — FOLIC ACID 1 MG PO TABS
1.0000 mg | ORAL_TABLET | Freq: Every day | ORAL | Status: DC
  Administered 2022-01-03 – 2022-01-07 (×5): 1 mg via ORAL
  Filled 2022-01-03 (×5): qty 1

## 2022-01-03 NOTE — Plan of Care (Signed)

## 2022-01-03 NOTE — Progress Notes (Signed)
Patient refused morning Lab draws. 

## 2022-01-03 NOTE — Progress Notes (Signed)
  Transition of Care [facility]) Screening Note   Patient Details  Name: Karen Casey Date of Birth: Aug 25, 1955   [facility]) CM/SW Contact:    Dessa Phi, RN Phone Number: 01/03/2022, 11:30 AM    Transition of Care Department [facility]) has reviewed patient and no TOC needs have been identified at this time. We will continue to monitor patient advancement through interdisciplinary progression rounds. If new patient transition needs arise, please place a TOC consult.

## 2022-01-03 NOTE — Progress Notes (Signed)
Pt complains of pain from the rectum where the rectal tube was placed. Pt laying directly on tube . Requested tylenol and help repositioning . When entering the room saw that rectal tube was in floor completely inflated. Pt states that she pulled it out because it hurt and it was not going to be put back in, provider [employer] notified and states that it is okay to leave tube out at this time and utilize the bedpan or bedside commode. Will continue to monitor , call bell within reach. Pt has no further concerns and said that she feels much better pain wise now that the tube is out.

## 2022-01-03 NOTE — Progress Notes (Signed)
[facility] PROGRESS NOTE   Karen Casey:[id number] DOB: 01/01/1956 DOA: 01/02/2022  PCP: [name], MD  Brief History/Interval Summary: 66 y.o. female with medical history significant of GERD, PAF, EtOH abuse, tobacco abuse, COPD, anxiety/depression.  Presented with 3-day history of diarrhea.  CT scan suggested enterocolitis.  Recent antibiotic use reported by patient for UTI.  Patient hospitalized for further management.  Stool studies are pending.    Consultants: None  Procedures: None    Subjective/Interval History: Patient denies any abdominal pain nausea or vomiting.  Continues to have diarrhea.  Denies any shortness of breath.  Complains of neuropathy in her lower extremity which is chronic for her.    Assessment/Plan:  Acute enterocolitis/severe diarrhea/sepsis present at admission She had leukocytosis and tachycardia at the time of admission.  She was afebrile.  Lactic acid level noted to be mildly elevated.  Partly due to hypovolemia. Recent antibiotic use for urinary tract infection. C. difficile is noted to be negative.  GI pathogen panel is pending. Patient had WBC of 21,000 at the time of admission.  She was started on ceftriaxone and metronidazole empirically.  Continue for now. WBC noted to be better today at 12.1. Continues to have profuse diarrhea.  If GI pathogen panel is negative then we can start giving her antimotility agents. CT scan suggested enterocolitis without any other acute findings.  Severe hypokalemia/hypomagnesemia Potassium to be aggressively repleted.  We will give her magnesium supplements as well.  Recheck labs tomorrow.  Continue to monitor on telemetry.  Normocytic anemia Drop in hemoglobin is likely dilutional.  Paroxysmal atrial fibrillation EKG suggests sinus tachycardia with PACs.  Not on chronic anticoagulation as determined by her outpatient providers.  Continue to monitor on telemetry for now.  History of  COPD Stable respiratory status noted.  Essential hypertension Monitor blood pressures closely.  Currently not on any antihypertensives.  History of peripheral neuropathy/neuromuscular disease Nonambulatory at baseline.  Uses a wheelchair to get around.  Previous history of alcohol and tobacco abuse Patient denies current use.  History of depression and anxiety Resume home medications.  Noted to be on BuSpar.   DVT Prophylaxis: Lovenox Code Status: Full code Family Communication: Discussed with patient Disposition Plan: Hopefully return home with her husband when improved  Status is: Inpatient Remains inpatient appropriate because: Hypokalemia, severe diarrhea      Medications: Scheduled:  busPIRone  10 mg Oral Q1500   enoxaparin (LOVENOX) injection  40 mg Subcutaneous Q24H   feeding supplement  237 mL Oral Daily   fluticasone furoate-vilanterol  1 puff Inhalation Daily   And   umeclidinium bromide  1 puff Inhalation Daily   folic acid  1 mg Oral Daily   gabapentin  600 mg Oral TID   liver oil-zinc oxide   Topical Daily   multivitamin with minerals  1 tablet Oral Daily   nortriptyline  75 mg Oral QHS   potassium chloride  40 mEq Oral Q4H   Continuous:  0.45 % NaCl with KCl 20 mEq / L     cefTRIAXone (ROCEPHIN)  IV Stopped (01/02/22 1530)   magnesium sulfate bolus IVPB     metronidazole 500 mg (01/03/22 0226)   potassium chloride     HYI:FOYDXAJOINOMV **OR** acetaminophen, albuterol, alum & mag hydroxide-simeth, oxyCODONE, prochlorperazine  Antibiotics: Anti-infectives (From admission, onward)    Start     Dose/Rate Route Frequency Ordered Stop   01/02/22 1400  cefTRIAXone (ROCEPHIN) 2 g in sodium chloride 0.9 % 100  mL IVPB        2 g 200 mL/hr over 30 Minutes Intravenous Every 24 hours 01/02/22 1350 01/09/22 1359   01/02/22 1400  metroNIDAZOLE (FLAGYL) IVPB 500 mg        500 mg 100 mL/hr over 60 Minutes Intravenous Every 12 hours 01/02/22 1350 01/09/22 1359        Objective:  Vital Signs  Vitals:   01/02/22 1606 01/02/22 1749 01/02/22 2221 01/03/22 0230  BP:  (!) 132/54 109/60 110/62  Pulse:  (!) 58 100 99  Resp:  19 18   Temp:  (!) 97.4 F (36.3 C) 98.2 F (36.8 C) 98.4 F (36.9 C)  TempSrc:  Oral  Oral  SpO2:  95% 100% 98%  Weight: 59.8 kg     Height: '5\' 3"'$  (1.6 m)       Intake/Output Summary (Last 24 hours) at 01/03/2022 1038 Last data filed at 01/03/2022 0900 Gross per 24 hour  Intake 2393.43 ml  Output 9 ml  Net 2384.43 ml   Filed Weights   01/02/22 0928 01/02/22 1606  Weight: 74.8 kg 59.8 kg    General appearance: Awake alert.  In no distress Resp: Clear to auscultation bilaterally.  Normal effort Cardio: S1-S2 is normal regular.  No S3-S4.  No rubs murmurs or bruit GI: Abdomen is soft.  Nontender nondistended.  Bowel sounds are present normal.  No masses organomegaly Extremities: No edema.   Neurologic: Alert and oriented x3.  No focal neurological deficits.    Lab Results:  Data Reviewed: I have personally reviewed following labs and reports of the imaging studies  CBC: Recent Labs  Lab 01/02/22 1035 01/03/22 0903  WBC 21.1* 12.1*  NEUTROABS 16.6* 9.3*  HGB 15.6* 11.9*  HCT 45.5 34.7*  MCV 92.9 94.8  PLT 260 106    Basic Metabolic Panel: Recent Labs  Lab 01/02/22 1035 01/03/22 0903  NA 134* 138  K 3.5 2.6*  CL 100 107  CO2 21* 23  GLUCOSE 121* 84  BUN 11 8  CREATININE 0.87 0.63  CALCIUM 8.2* 7.9*  MG 1.5*  --     GFR: Estimated Creatinine Clearance: 59.5 mL/min (by C-G formula based on SCr of 0.63 mg/dL).  Liver Function Tests: Recent Labs  Lab 01/02/22 1035 01/03/22 0903  AST 33 21  ALT 20 17  ALKPHOS 112 87  BILITOT 0.8 0.7  PROT 5.7* 4.5*  ALBUMIN 2.4* 1.8*    Recent Labs  Lab 01/02/22 1035  LIPASE 22    Coagulation Profile: Recent Labs  Lab 01/03/22 0903  INR 1.4*     Recent Results (from the past 240 hour(s))  C Difficile Quick Screen w PCR reflex      Status: None   Collection Time: 01/02/22  1:34 PM   Specimen: STOOL  Result Value Ref Range Status   C Diff antigen NEGATIVE NEGATIVE Final   C Diff toxin NEGATIVE NEGATIVE Final   C Diff interpretation No C. difficile detected.  Final    Comment: Performed at [facility], [place] [address] [place], [place] [address]  Culture, blood (Routine X 2) w Reflex to ID Panel     Status: None (Preliminary result)   Collection Time: 01/02/22  4:48 PM   Specimen: BLOOD RIGHT HAND  Result Value Ref Range Status   Specimen Description   Final    BLOOD RIGHT HAND Performed at [facility], [place] [address] [place], [place] [address]    Special  Requests   Final    IN PEDIATRIC BOTTLE Blood Culture adequate volume Performed at [facility] [address], [place], [place] [address]    Culture   Final    NO GROWTH < 12 HOURS Performed at [facility] [address], [place], [place] [address]    Report Status PENDING  Incomplete  Culture, blood (Routine X 2) w Reflex to ID Panel     Status: None (Preliminary result)   Collection Time: 01/02/22  4:48 PM   Specimen: BLOOD RIGHT HAND  Result Value Ref Range Status   Specimen Description   Final    BLOOD RIGHT HAND Performed at [facility], [place] [address] [place], [place] [address]    Special Requests   Final    BOTTLES DRAWN AEROBIC ONLY Blood Culture adequate volume Performed at [facility] [address], [place], [place] [address]    Culture   Final    NO GROWTH < 12 HOURS Performed at [facility] [address] [place], [place] [address]    Report Status PENDING  Incomplete      Radiology Studies: DG CHEST PORT 1 VIEW  Result Date: 01/02/2022 CLINICAL DATA:  Sepsis.  Complains of nausea, vomiting and diarrhea. EXAM: PORTABLE CHEST 1 VIEW COMPARISON:  12/11/2021 FINDINGS: Heart size and mediastinal contours are unremarkable. There is no pleural  effusion or edema. No airspace opacities identified. The visualized osseous structures are unremarkable. IMPRESSION: No active disease. Electronically Signed   By: Kerby Moors M.D.   On: 01/02/2022 14:15   CT ABDOMEN PELVIS W CONTRAST  Result Date: 01/02/2022 CLINICAL DATA:  Abdominal pain, nausea, vomiting, diarrhea EXAM: CT ABDOMEN AND PELVIS WITH CONTRAST TECHNIQUE: Multidetector CT imaging of the abdomen and pelvis was performed using the standard protocol following bolus administration of intravenous contrast. RADIATION DOSE REDUCTION: This exam was performed according to the departmental dose-optimization program which includes automated exposure control, adjustment of the mA and/or kV according to patient size and/or use of iterative reconstruction technique. CONTRAST:  139m OMNIPAQUE IOHEXOL 300 MG/ML  SOLN COMPARISON:  12/11/2021 FINDINGS: Lower chest: Small linear densities in the lingula suggest scarring or subsegmental atelectasis. Hepatobiliary: There is marked fatty infiltration of the liver. In image 20 of series 2, there is a 8 mm low-density, possibly a cyst. There is no dilation of bile ducts. Gallbladder is unremarkable. Pancreas: Pancreas appears smaller than usual in size. No focal abnormalities are seen. Spleen: Unremarkable. Adrenals/Urinary Tract: Adrenals are not enlarged. There is no hydronephrosis. There are no renal or ureteral stones. Urinary bladder is not distended. There is possible subcentimeter cyst in the upper pole of left kidney. Stomach/Bowel: Small hiatal hernia is seen. Stomach is unremarkable. There is mild dilation of small bowel loops. There is fluid in the lumen of small bowel loops. Appendix is not seen. There is mild diffuse wall thickening in ascending colon. There is incomplete distention of ascending colon. Liquid stool is seen in colon. Vascular/Lymphatic: Scattered atherosclerotic plaques and calcifications are seen. Reproductive: Unremarkable. Other: Trace  amount of free fluid is seen in pelvis. There is no pneumoperitoneum. Musculoskeletal: Last lumbar vertebra is transitional. Degenerative changes are noted at the L4-L5 level with encroachment of neural foramina. IMPRESSION: There is mild dilation of small bowel loops along with fluid in the lumen. There is wall thickening in ascending colon. There is liquid stool in colon. Findings suggest enterocolitis. There is no evidence of intestinal obstruction  or pneumoperitoneum. There is no hydronephrosis. Fatty liver.  Small hiatal hernia. Other findings as described in the body of the report. Electronically Signed   By: Elmer Picker M.D.   On: 01/02/2022 12:20       LOS: 1 day   [facility] Pager on [email or web address]  01/03/2022, 10:38 AM

## 2022-01-04 ENCOUNTER — Ambulatory Visit: Payer: Self-pay | Admitting: Licensed Clinical Social Worker

## 2022-01-04 DIAGNOSIS — I10 Essential (primary) hypertension: Secondary | ICD-10-CM

## 2022-01-04 LAB — COMPREHENSIVE METABOLIC PANEL
ALT: 16 U/L (ref 0–44)
AST: 19 U/L (ref 15–41)
Albumin: 1.8 g/dL — ABNORMAL LOW (ref 3.5–5.0)
Alkaline Phosphatase: 85 U/L (ref 38–126)
Anion gap: 5 (ref 5–15)
BUN: 8 mg/dL (ref 8–23)
CO2: 20 mmol/L — ABNORMAL LOW (ref 22–32)
Calcium: 7.6 mg/dL — ABNORMAL LOW (ref 8.9–10.3)
Chloride: 109 mmol/L (ref 98–111)
Creatinine, Ser: 0.43 mg/dL — ABNORMAL LOW (ref 0.44–1.00)
GFR, Estimated: >60 mL/min (ref >60)
Glucose, Bld: 81 mg/dL (ref 70–99)
Potassium: 3.6 mmol/L (ref 3.5–5.1)
Sodium: 134 mmol/L — ABNORMAL LOW (ref 135–145)
Total Bilirubin: 0.5 mg/dL (ref 0.3–1.2)
Total Protein: 4.4 g/dL — ABNORMAL LOW (ref 6.5–8.1)

## 2022-01-04 LAB — GASTROINTESTINAL PANEL BY PCR, STOOL (REPLACES STOOL CULTURE)

## 2022-01-04 LAB — BLOOD CULTURE ID PANEL (REFLEXED) - BCID2

## 2022-01-04 LAB — CBC
HCT: 34.5 % — ABNORMAL LOW (ref 36.0–46.0)
Hemoglobin: 11.4 g/dL — ABNORMAL LOW (ref 12.0–15.0)
MCH: 31.8 pg (ref 26.0–34.0)
MCHC: 33 g/dL (ref 30.0–36.0)
MCV: 96.4 fL (ref 80.0–100.0)
Platelets: 152 10*3/uL (ref 150–400)
RBC: 3.58 MIL/uL — ABNORMAL LOW (ref 3.87–5.11)
RDW: 13.1 % (ref 11.5–15.5)
WBC: 8 10*3/uL (ref 4.0–10.5)
nRBC: 0 % (ref 0.0–0.2)

## 2022-01-04 LAB — MAGNESIUM: Magnesium: 1.4 mg/dL — ABNORMAL LOW (ref 1.7–2.4)

## 2022-01-04 MED ORDER — SODIUM CHLORIDE 0.9% FLUSH: 10.0000 mL | INTRAVENOUS | Status: DC | PRN

## 2022-01-04 MED ORDER — SACCHAROMYCES BOULARDII 250 MG PO CAPS
250.0000 mg | ORAL_CAPSULE | Freq: Two times a day (BID) | ORAL | Status: DC
  Administered 2022-01-04 – 2022-01-06 (×4): 250 mg via ORAL
  Filled 2022-01-04 (×5): qty 1

## 2022-01-04 MED ORDER — LOPERAMIDE HCL 2 MG PO CAPS: 4.0000 mg | ORAL_CAPSULE | Freq: Three times a day (TID) | ORAL | Status: DC | PRN

## 2022-01-04 MED ORDER — POTASSIUM CHLORIDE CRYS ER 20 MEQ PO TBCR
40.0000 meq | EXTENDED_RELEASE_TABLET | Freq: Two times a day (BID) | ORAL | Status: AC
  Administered 2022-01-04 (×2): 40 meq via ORAL
  Filled 2022-01-04 (×2): qty 2

## 2022-01-04 MED ORDER — MAGNESIUM SULFATE 4 GM/100ML IV SOLN
4.0000 g | Freq: Once | INTRAVENOUS | Status: AC
  Administered 2022-01-04: 4 g via INTRAVENOUS
  Filled 2022-01-04: qty 100

## 2022-01-04 MED ORDER — LOPERAMIDE HCL 2 MG PO CAPS
4.0000 mg | ORAL_CAPSULE | Freq: Once | ORAL | Status: AC
  Administered 2022-01-04: 4 mg via ORAL
  Filled 2022-01-04: qty 2

## 2022-01-04 NOTE — Plan of Care (Signed)

## 2022-01-04 NOTE — Progress Notes (Signed)
PHARMACY - PHYSICIAN COMMUNICATION CRITICAL VALUE ALERT - BLOOD CULTURE IDENTIFICATION (BCID)  Karen Casey is an 66 y.o. female who presented to [facility] on 01/02/2022 with  Chief Complaint  Patient presents with   Diarrhea   Emesis     Assessment:  1 of 2 in aerobic bottle showed gpc in chains; no pathogens detected on BCID panel  Name of physician (or Provider) Contacted:  Clarene Essex  Current antibiotics: Rocephin  Changes to prescribed antibiotics recommended: No changes recommended   Results for orders placed or performed during the hospital encounter of 01/02/22  Blood Culture ID Panel (Reflexed) (Collected: 01/02/2022  4:48 PM)  Result Value Ref Range   Enterococcus faecalis NOT DETECTED NOT DETECTED   Enterococcus Faecium NOT DETECTED NOT DETECTED   Listeria monocytogenes NOT DETECTED NOT DETECTED   Staphylococcus species NOT DETECTED NOT DETECTED   Staphylococcus aureus (BCID) NOT DETECTED NOT DETECTED   Staphylococcus epidermidis NOT DETECTED NOT DETECTED   Staphylococcus lugdunensis NOT DETECTED NOT DETECTED   Streptococcus species NOT DETECTED NOT DETECTED   Streptococcus agalactiae NOT DETECTED NOT DETECTED   Streptococcus pneumoniae NOT DETECTED NOT DETECTED   Streptococcus pyogenes NOT DETECTED NOT DETECTED   A.calcoaceticus-baumannii NOT DETECTED NOT DETECTED   Bacteroides fragilis NOT DETECTED NOT DETECTED   Enterobacterales NOT DETECTED NOT DETECTED   Enterobacter cloacae complex NOT DETECTED NOT DETECTED   Escherichia coli NOT DETECTED NOT DETECTED   Klebsiella aerogenes NOT DETECTED NOT DETECTED   Klebsiella oxytoca NOT DETECTED NOT DETECTED   Klebsiella pneumoniae NOT DETECTED NOT DETECTED   Proteus species NOT DETECTED NOT DETECTED   Salmonella species NOT DETECTED NOT DETECTED   Serratia marcescens NOT DETECTED NOT DETECTED   Haemophilus influenzae NOT DETECTED NOT DETECTED   Neisseria meningitidis NOT DETECTED NOT DETECTED   Pseudomonas  aeruginosa NOT DETECTED NOT DETECTED   Stenotrophomonas maltophilia NOT DETECTED NOT DETECTED   Candida albicans NOT DETECTED NOT DETECTED   Candida auris NOT DETECTED NOT DETECTED   Candida glabrata NOT DETECTED NOT DETECTED   Candida krusei NOT DETECTED NOT DETECTED   Candida parapsilosis NOT DETECTED NOT DETECTED   Candida tropicalis NOT DETECTED NOT DETECTED   Cryptococcus neoformans/gattii NOT DETECTED NOT DETECTED    Suzzanne Cloud, PharmD, BCPS 01/04/2022  7:30 PM

## 2022-01-04 NOTE — Patient Outreach (Signed)
  Care Coordination   Follow Up Visit Note   01/04/2022 Name: NEVAE PINNIX MRN: [id number] DOB: Dec 28, 1955  [name] is a 67 y.o. year old female who sees Pray, Norwood Levo, MD for primary care. I spoke with  [name] by phone today  What matters to the patients health and wellness today? Patient is currently hospitalized. Patient requested a call back. S      Care Coordination Interventions Activated:  Yes Care Coordination Interventions:  Yes, provided  Follow up plan: Follow up call scheduled for next 7 days.  Encounter Outcome:  Pt. Visit Completed  Lenor Derrick, MSW  Social Worker [facility]  [phone]

## 2022-01-04 NOTE — Progress Notes (Signed)
[facility] PROGRESS NOTE   Karen Casey DQQ:[id number] DOB: May 03, 1956 DOA: 01/02/2022  PCP: [name], MD  Brief History/Interval Summary: 66 y.o. female with medical history significant of GERD, PAF, EtOH abuse, tobacco abuse, COPD, anxiety/depression.  Presented with 3-day history of diarrhea.  CT scan suggested enterocolitis.  Recent antibiotic use reported by patient for UTI.  Patient hospitalized for further management.  Stool studies are pending.    Consultants: None  Procedures: None    Subjective/Interval History: Patient continues to have profuse diarrhea.  Denies any abdominal pain.  Some nausea but no vomiting.     Assessment/Plan:  Acute enterocolitis/severe diarrhea/sepsis present at admission She had leukocytosis and tachycardia at the time of admission.  She was afebrile.  Lactic acid level noted to be mildly elevated.  Partly due to hypovolemia. Recent antibiotic use for urinary tract infection. C. difficile is noted to be negative.  GI pathogen panel is pending. Patient had WBC of 21,000 at the time of admission.  She was started on ceftriaxone and metronidazole empirically.  Continue for now. WBC is now normal. Continues to have profuse diarrhea.  If GI pathogen panel is negative then we can start giving her antimotility agents. CT scan suggested enterocolitis without any other acute findings.  Abdomen remains benign on examination.  Severe hypokalemia/hypomagnesemia Potassium was aggressively repleted.  Improved to 3.6 today.  Magnesium remains low at 1.4.  Intravenous supplementation will be ordered.  Recheck tomorrow.   Additional dose of potassium to be given today.  Normocytic anemia Drop in hemoglobin is likely dilutional.  No overt bleeding noted.  Paroxysmal atrial fibrillation EKG suggests sinus tachycardia with PACs.  Not on chronic anticoagulation as determined by her outpatient providers.  Continue to monitor on telemetry for  now.  History of COPD Stable respiratory status noted.  Essential hypertension Monitor blood pressures closely.  Currently not on any antihypertensives.  Blood pressure is reasonably well controlled.  History of peripheral neuropathy/neuromuscular disease Nonambulatory at baseline.  Uses a wheelchair to get around.  Previous history of alcohol and tobacco abuse Patient denies current use.  History of depression and anxiety Resume home medications.  Noted to be on BuSpar.   DVT Prophylaxis: Lovenox Code Status: Full code Family Communication: Discussed with patient.  She mentions that she will update her husband. Disposition Plan: Hopefully return home with her husband when improved  Status is: Inpatient Remains inpatient appropriate because: Hypokalemia, severe diarrhea      Medications: Scheduled:  busPIRone  10 mg Oral Q1500   enoxaparin (LOVENOX) injection  40 mg Subcutaneous Q24H   feeding supplement  237 mL Oral Daily   fluticasone furoate-vilanterol  1 puff Inhalation Daily   And   umeclidinium bromide  1 puff Inhalation Daily   folic acid  1 mg Oral Daily   gabapentin  600 mg Oral TID   liver oil-zinc oxide   Topical Daily   multivitamin with minerals  1 tablet Oral Daily   nortriptyline  75 mg Oral QHS   pantoprazole  40 mg Oral q morning   Continuous:  0.45 % NaCl with KCl 20 mEq / L 100 mL/hr at 01/04/22 0055   cefTRIAXone (ROCEPHIN)  IV 2 g (01/03/22 1337)   magnesium sulfate bolus IVPB 4 g (01/04/22 1054)   metronidazole 500 mg (01/04/22 0216)   JHE:RDEYCXKGYJEHU **OR** acetaminophen, albuterol, alum & mag hydroxide-simeth, oxyCODONE, polyvinyl alcohol, prochlorperazine, sodium chloride flush  Antibiotics: Anti-infectives (From admission, onward)    Start  Dose/Rate Route Frequency Ordered Stop   01/02/22 1400  cefTRIAXone (ROCEPHIN) 2 g in sodium chloride 0.9 % 100 mL IVPB        2 g 200 mL/hr over 30 Minutes Intravenous Every 24 hours  01/02/22 1350 01/09/22 1359   01/02/22 1400  metroNIDAZOLE (FLAGYL) IVPB 500 mg        500 mg 100 mL/hr over 60 Minutes Intravenous Every 12 hours 01/02/22 1350 01/09/22 1359       Objective:  Vital Signs  Vitals:   01/03/22 1323 01/03/22 2041 01/04/22 0437 01/04/22 0603  BP: 120/74 118/72 (!) 139/92   Pulse: 80 (!) 101 88   Resp: '20 18 18   '$ Temp: 98.7 F (37.1 C) 98.3 F (36.8 C) 98.4 F (36.9 C)   TempSrc:  Oral    SpO2: 98% 100% 100% 100%  Weight:      Height:        Intake/Output Summary (Last 24 hours) at 01/04/2022 1056 Last data filed at 01/03/2022 1500 Gross per 24 hour  Intake 367.55 ml  Output --  Net 367.55 ml    Filed Weights   01/02/22 0928 01/02/22 1606  Weight: 74.8 kg 59.8 kg    General appearance: Awake alert.  In no distress Resp: Clear to auscultation bilaterally.  Normal effort Cardio: S1-S2 is normal regular.  No S3-S4.  No rubs murmurs or bruit GI: Abdomen is soft.  Nontender nondistended.  Bowel sounds are present normal.  No masses organomegaly Extremities: No edema.   Neurologic: Alert and oriented x3.  No focal neurological deficits.     Lab Results:  Data Reviewed: I have personally reviewed following labs and reports of the imaging studies  CBC: Recent Labs  Lab 01/02/22 1035 01/03/22 0903 01/04/22 0455  WBC 21.1* 12.1* 8.0  NEUTROABS 16.6* 9.3*  --   HGB 15.6* 11.9* 11.4*  HCT 45.5 34.7* 34.5*  MCV 92.9 94.8 96.4  PLT 260 153 152     Basic Metabolic Panel: Recent Labs  Lab 01/02/22 1035 01/03/22 0903 01/04/22 0455  NA 134* 138 134*  K 3.5 2.6* 3.6  CL 100 107 109  CO2 21* 23 20*  GLUCOSE 121* 84 81  BUN '11 8 8  '$ CREATININE 0.87 0.63 0.43*  CALCIUM 8.2* 7.9* 7.6*  MG 1.5*  --  1.4*     GFR: Estimated Creatinine Clearance: 59.5 mL/min (A) (by C-G formula based on SCr of 0.43 mg/dL (L)).  Liver Function Tests: Recent Labs  Lab 01/02/22 1035 01/03/22 0903 01/04/22 0455  AST 33 21 19  ALT '20 17 16   '$ ALKPHOS 112 87 85  BILITOT 0.8 0.7 0.5  PROT 5.7* 4.5* 4.4*  ALBUMIN 2.4* 1.8* 1.8*     Recent Labs  Lab 01/02/22 1035  LIPASE 22     Coagulation Profile: Recent Labs  Lab 01/03/22 0903  INR 1.4*      Recent Results (from the past 240 hour(s))  C Difficile Quick Screen w PCR reflex     Status: None   Collection Time: 01/02/22  1:34 PM   Specimen: STOOL  Result Value Ref Range Status   C Diff antigen NEGATIVE NEGATIVE Final   C Diff toxin NEGATIVE NEGATIVE Final   C Diff interpretation No C. difficile detected.  Final    Comment: Performed at [facility], [place] [address] [place], [place] [address]  Culture, blood (Routine X 2) w Reflex to ID Panel     Status: None (  Preliminary result)   Collection Time: 01/02/22  4:48 PM   Specimen: BLOOD RIGHT HAND  Result Value Ref Range Status   Specimen Description   Final    BLOOD RIGHT HAND Performed at [facility], [place] [address], [place], [place] [address]    Special Requests   Final    IN PEDIATRIC BOTTLE Blood Culture adequate volume Performed at [place] [address], [place], [place] [address]    Culture   Final    NO GROWTH < 12 HOURS Performed at [place] [address], [place], [place] [address]    Report Status PENDING  Incomplete  Culture, blood (Routine X 2) w Reflex to ID Panel     Status: None (Preliminary result)   Collection Time: 01/02/22  4:48 PM   Specimen: BLOOD RIGHT HAND  Result Value Ref Range Status   Specimen Description   Final    BLOOD RIGHT HAND Performed at [facility], [place] [address], [place], [place] [address]    Special Requests   Final    BOTTLES DRAWN AEROBIC ONLY Blood Culture adequate volume Performed at [place] [address] [place], [place] [address]    Culture   Final    NO GROWTH < 12 HOURS Performed at [place] [address] [place],  [address]    Report  Status PENDING  Incomplete      Radiology Studies: DG CHEST PORT 1 VIEW  Result Date: 01/02/2022 CLINICAL DATA:  Sepsis.  Complains of nausea, vomiting and diarrhea. EXAM: PORTABLE CHEST 1 VIEW COMPARISON:  12/11/2021 FINDINGS: Heart size and mediastinal contours are unremarkable. There is no pleural effusion or edema. No airspace opacities identified. The visualized osseous structures are unremarkable. IMPRESSION: No active disease. Electronically Signed   By: Kerby Moors M.D.   On: 01/02/2022 14:15   CT ABDOMEN PELVIS W CONTRAST  Result Date: 01/02/2022 CLINICAL DATA:  Abdominal pain, nausea, vomiting, diarrhea EXAM: CT ABDOMEN AND PELVIS WITH CONTRAST TECHNIQUE: Multidetector CT imaging of the abdomen and pelvis was performed using the standard protocol following bolus administration of intravenous contrast. RADIATION DOSE REDUCTION: This exam was performed according to the departmental dose-optimization program which includes automated exposure control, adjustment of the mA and/or kV according to patient size and/or use of iterative reconstruction technique. CONTRAST:  127m OMNIPAQUE IOHEXOL 300 MG/ML  SOLN COMPARISON:  12/11/2021 FINDINGS: Lower chest: Small linear densities in the lingula suggest scarring or subsegmental atelectasis. Hepatobiliary: There is marked fatty infiltration of the liver. In image 20 of series 2, there is a 8 mm low-density, possibly a cyst. There is no dilation of bile ducts. Gallbladder is unremarkable. Pancreas: Pancreas appears smaller than usual in size. No focal abnormalities are seen. Spleen: Unremarkable. Adrenals/Urinary Tract: Adrenals are not enlarged. There is no hydronephrosis. There are no renal or ureteral stones. Urinary bladder is not distended. There is possible subcentimeter cyst in the upper pole of left kidney. Stomach/Bowel: Small hiatal hernia is seen. Stomach is unremarkable. There is mild dilation of small bowel loops. There is fluid in the lumen  of small bowel loops. Appendix is not seen. There is mild diffuse wall thickening in ascending colon. There is incomplete distention of ascending colon. Liquid stool is seen in colon. Vascular/Lymphatic: Scattered atherosclerotic plaques and calcifications are seen. Reproductive: Unremarkable. Other: Trace amount of free fluid is seen in pelvis. There is no pneumoperitoneum. Musculoskeletal: Last lumbar vertebra is  transitional. Degenerative changes are noted at the L4-L5 level with encroachment of neural foramina. IMPRESSION: There is mild dilation of small bowel loops along with fluid in the lumen. There is wall thickening in ascending colon. There is liquid stool in colon. Findings suggest enterocolitis. There is no evidence of intestinal obstruction or pneumoperitoneum. There is no hydronephrosis. Fatty liver.  Small hiatal hernia. Other findings as described in the body of the report. Electronically Signed   By: Elmer Picker M.D.   On: 01/02/2022 12:20       LOS: 2 days   [facility] Pager on [email or web address]  01/04/2022, 10:56 AM

## 2022-01-05 ENCOUNTER — Other Ambulatory Visit: Payer: Self-pay | Admitting: Family Medicine

## 2022-01-05 DIAGNOSIS — F419 Anxiety disorder, unspecified: Secondary | ICD-10-CM

## 2022-01-05 LAB — BASIC METABOLIC PANEL
Anion gap: 3 — ABNORMAL LOW (ref 5–15)
BUN: 6 mg/dL — ABNORMAL LOW (ref 8–23)
CO2: 19 mmol/L — ABNORMAL LOW (ref 22–32)
Calcium: 7.3 mg/dL — ABNORMAL LOW (ref 8.9–10.3)
Chloride: 108 mmol/L (ref 98–111)
Creatinine, Ser: 0.44 mg/dL (ref 0.44–1.00)
GFR, Estimated: >60 mL/min (ref >60)
Glucose, Bld: 108 mg/dL — ABNORMAL HIGH (ref 70–99)
Potassium: 5.5 mmol/L — ABNORMAL HIGH (ref 3.5–5.1)
Sodium: 130 mmol/L — ABNORMAL LOW (ref 135–145)

## 2022-01-05 LAB — POTASSIUM
Potassium: 5.7 mmol/L — ABNORMAL HIGH (ref 3.5–5.1)
Potassium: 5.1 mmol/L (ref 3.5–5.1)

## 2022-01-05 LAB — MAGNESIUM: Magnesium: 2.1 mg/dL (ref 1.7–2.4)

## 2022-01-05 MED ORDER — METRONIDAZOLE 500 MG PO TABS
500.0000 mg | ORAL_TABLET | Freq: Once | ORAL | Status: AC
  Administered 2022-01-05: 500 mg via ORAL
  Filled 2022-01-05: qty 1

## 2022-01-05 NOTE — Progress Notes (Signed)
[facility] PROGRESS NOTE   Karen Casey UMP:[id number] DOB: 09-30-1955 DOA: 01/02/2022  PCP: [name], MD  Brief History/Interval Summary: 66 y.o. female with medical history significant of GERD, PAF, EtOH abuse, tobacco abuse, COPD, anxiety/depression.  Presented with 3-day history of diarrhea.  CT scan suggested enterocolitis.  Recent antibiotic use reported by patient for UTI.  Patient hospitalized for further management.  Stool studies are pending.    Consultants: None  Procedures: None    Subjective/Interval History: Patient continues to have diarrhea but frequency appears to be decreasing and not as watery as before.  Denies any abdominal pain nausea or vomiting.  Refused labs earlier this morning but is willing to undergo blood draw now.   Assessment/Plan:  Acute enterocolitis/severe diarrhea/sepsis present at admission She had leukocytosis and tachycardia at the time of admission.  She was afebrile.  Lactic acid level noted to be mildly elevated.  Partly due to hypovolemia. Recent antibiotic use for urinary tract infection. C. difficile is noted to be negative.  GI pathogen panel is pending. Patient had WBC of 21,000 at the time of admission.  She was started on ceftriaxone and metronidazole empirically.   WBC now noted to be normal as of yesterday. GI pathogen panel was negative.  Enteric precautions were discontinued. CT scan suggested enterocolitis without any other acute findings.   Continue ceftriaxone and metronidazole for now.  Imodium ordered for the diarrhea which seems to be helping.  Continue probiotics as well.  Bacteremia 1 out of 2 bottles showed gram-positive cocci in chains.  Patient noted to be afebrile.  Likely contaminant.  Wait on final identification.  Severe hypokalemia/hypomagnesemia Potassium and magnesium were supplemented yesterday.  Labs are pending from today.    Normocytic anemia Drop in hemoglobin is likely dilutional.   No overt bleeding noted.  Paroxysmal atrial fibrillation EKG suggests sinus tachycardia with PACs.  Not on chronic anticoagulation as determined by her outpatient providers.  Continue to monitor on telemetry for now.  History of COPD Stable respiratory status noted.  Essential hypertension Monitor blood pressures closely.  Currently not on any antihypertensives.  Blood pressure is reasonably well controlled.  History of peripheral neuropathy/neuromuscular disease Nonambulatory at baseline.  Uses a wheelchair to get around.  Previous history of alcohol and tobacco abuse Patient denies current use.  History of depression and anxiety Resume home medications.  Noted to be on BuSpar.   DVT Prophylaxis: Lovenox Code Status: Full code Family Communication: Discussed with patient.  She mentions that she will update her husband. Disposition Plan: Anticipate return home when improved.  Hopefully in another 24 to 48 hours.  Status is: Inpatient Remains inpatient appropriate because: Hypokalemia, severe diarrhea      Medications: Scheduled:  busPIRone  10 mg Oral Q1500   enoxaparin (LOVENOX) injection  40 mg Subcutaneous Q24H   feeding supplement  237 mL Oral Daily   fluticasone furoate-vilanterol  1 puff Inhalation Daily   And   umeclidinium bromide  1 puff Inhalation Daily   folic acid  1 mg Oral Daily   gabapentin  600 mg Oral TID   liver oil-zinc oxide   Topical Daily   multivitamin with minerals  1 tablet Oral Daily   nortriptyline  75 mg Oral QHS   pantoprazole  40 mg Oral q morning   saccharomyces boulardii  250 mg Oral BID   Continuous:  0.45 % NaCl with KCl 20 mEq / L Stopped (01/05/22 0612)   cefTRIAXone (ROCEPHIN)  IV 2 g (01/04/22 1425)   metronidazole 500 mg (01/04/22 1559)   UMP:[id number] **OR** acetaminophen, albuterol, alum & mag hydroxide-simeth, loperamide, oxyCODONE, polyvinyl alcohol, prochlorperazine, sodium chloride  flush  Antibiotics: Anti-infectives (From admission, onward)    Start     Dose/Rate Route Frequency Ordered Stop   01/05/22 0600  metroNIDAZOLE (FLAGYL) tablet 500 mg        500 mg Oral  Once 01/05/22 0502 01/05/22 0611   01/02/22 1400  cefTRIAXone (ROCEPHIN) 2 g in sodium chloride 0.9 % 100 mL IVPB        2 g 200 mL/hr over 30 Minutes Intravenous Every 24 hours 01/02/22 1350 01/09/22 1359   01/02/22 1400  metroNIDAZOLE (FLAGYL) IVPB 500 mg        500 mg 100 mL/hr over 60 Minutes Intravenous Every 12 hours 01/02/22 1350 01/09/22 1659       Objective:  Vital Signs  Vitals:   01/05/22 0457 01/05/22 0916 01/05/22 0943 01/05/22 1028  BP: 116/78     Pulse: 97     Resp: '20  19 17  '$ Temp: 98.3 F (36.8 C)     TempSrc: Oral     SpO2: 100% 99%    Weight:      Height:        Intake/Output Summary (Last 24 hours) at 01/05/2022 1105 Last data filed at 01/04/2022 1800 Gross per 24 hour  Intake 861.21 ml  Output --  Net 861.21 ml    Filed Weights   01/02/22 0928 01/02/22 1606  Weight: 74.8 kg 59.8 kg    General appearance: Awake alert.  In no distress Resp: Clear to auscultation bilaterally.  Normal effort Cardio: S1-S2 is normal regular.  No S3-S4.  No rubs murmurs or bruit GI: Abdomen is soft.  Nontender nondistended.  Bowel sounds are present normal.  No masses organomegaly Extremities: No edema.       Lab Results:  Data Reviewed: I have personally reviewed following labs and reports of the imaging studies  CBC: Recent Labs  Lab 01/02/22 1035 01/03/22 0903 01/04/22 0455  WBC 21.1* 12.1* 8.0  NEUTROABS 16.6* 9.3*  --   HGB 15.6* 11.9* 11.4*  HCT 45.5 34.7* 34.5*  MCV 92.9 94.8 96.4  PLT 260 153 152     Basic Metabolic Panel: Recent Labs  Lab 01/02/22 1035 01/03/22 0903 01/04/22 0455  NA 134* 138 134*  K 3.5 2.6* 3.6  CL 100 107 109  CO2 21* 23 20*  GLUCOSE 121* 84 81  BUN '11 8 8  '$ CREATININE 0.87 0.63 0.43*  CALCIUM 8.2* 7.9* 7.6*  MG 1.5*  --   1.4*     GFR: Estimated Creatinine Clearance: 59.5 mL/min (A) (by C-G formula based on SCr of 0.43 mg/dL (L)).  Liver Function Tests: Recent Labs  Lab 01/02/22 1035 01/03/22 0903 01/04/22 0455  AST 33 21 19  ALT '20 17 16  '$ ALKPHOS 112 87 85  BILITOT 0.8 0.7 0.5  PROT 5.7* 4.5* 4.4*  ALBUMIN 2.4* 1.8* 1.8*     Recent Labs  Lab 01/02/22 1035  LIPASE 22     Coagulation Profile: Recent Labs  Lab 01/03/22 0903  INR 1.4*      Recent Results (from the past 240 hour(s))  Gastrointestinal Panel by PCR , Stool     Status: None   Collection Time: 01/02/22  1:12 PM   Specimen: STOOL  Result Value Ref Range Status   Campylobacter species NOT DETECTED NOT DETECTED Final  Plesimonas shigelloides NOT DETECTED NOT DETECTED Final   Salmonella species NOT DETECTED NOT DETECTED Final   Yersinia enterocolitica NOT DETECTED NOT DETECTED Final   Vibrio species NOT DETECTED NOT DETECTED Final   Vibrio cholerae NOT DETECTED NOT DETECTED Final   Enteroaggregative E coli (EAEC) NOT DETECTED NOT DETECTED Final   Enteropathogenic E coli (EPEC) NOT DETECTED NOT DETECTED Final   Enterotoxigenic E coli (ETEC) NOT DETECTED NOT DETECTED Final   Shiga like toxin producing E coli (STEC) NOT DETECTED NOT DETECTED Final   Shigella/Enteroinvasive E coli (EIEC) NOT DETECTED NOT DETECTED Final   Cryptosporidium NOT DETECTED NOT DETECTED Final   Cyclospora cayetanensis NOT DETECTED NOT DETECTED Final   Entamoeba histolytica NOT DETECTED NOT DETECTED Final   Giardia lamblia NOT DETECTED NOT DETECTED Final   Adenovirus F40/41 NOT DETECTED NOT DETECTED Final   Astrovirus NOT DETECTED NOT DETECTED Final   Norovirus GI/GII NOT DETECTED NOT DETECTED Final   Rotavirus A NOT DETECTED NOT DETECTED Final   Sapovirus (I, II, IV, and V) NOT DETECTED NOT DETECTED Final    Comment: Performed at [facility], [place] [place], [place] [address]  C Difficile Quick Screen w PCR reflex      Status: None   Collection Time: 01/02/22  1:34 PM   Specimen: STOOL  Result Value Ref Range Status   C Diff antigen NEGATIVE NEGATIVE Final   C Diff toxin NEGATIVE NEGATIVE Final   C Diff interpretation No C. difficile detected.  Final    Comment: Performed at [facility], [place] [address] [place], [place] [address]  Culture, blood (Routine X 2) w Reflex to ID Panel     Status: None (Preliminary result)   Collection Time: 01/02/22  4:48 PM   Specimen: BLOOD RIGHT HAND  Result Value Ref Range Status   Specimen Description   Final    BLOOD RIGHT HAND Performed at [facility], [place] [address], [place], [place] [address]    Special Requests   Final    IN PEDIATRIC BOTTLE Blood Culture adequate volume Performed at [place] [address], [place], [place] [address]    Culture  Setup Time   Final    GRAM POSITIVE COCCI IN CHAINS IN PEDIATRIC BOTTLE CRITICAL RESULT CALLED TO, READ BACK BY AND VERIFIED WITH: RN Alyson Locket [phone] '@[phone]'$  FH Performed at [facility], [place] [address] [place], [place] [address]    Culture GRAM POSITIVE COCCI IN CHAINS  Final   Report Status PENDING  Incomplete  Culture, blood (Routine X 2) w Reflex to ID Panel     Status: None (Preliminary result)   Collection Time: 01/02/22  4:48 PM   Specimen: BLOOD RIGHT HAND  Result Value Ref Range Status   Specimen Description   Final    BLOOD RIGHT HAND Performed at [facility], [place] [address], [place], [place] [address]    Special Requests   Final    BOTTLES DRAWN AEROBIC ONLY Blood Culture adequate volume Performed at [place] [address] [place], [place] [address]    Culture   Final    NO GROWTH 3 DAYS Performed at [facility], [place] [address] [place], [place] [address]    Report Status PENDING  Incomplete  Blood Culture ID Panel (Reflexed)     Status: None   Collection Time: 01/02/22  4:48 PM  Result Value Ref  Range Status  Enterococcus faecalis NOT DETECTED NOT DETECTED Final   Enterococcus Faecium NOT DETECTED NOT DETECTED Final   Listeria monocytogenes NOT DETECTED NOT DETECTED Final   Staphylococcus species NOT DETECTED NOT DETECTED Final   Staphylococcus aureus (BCID) NOT DETECTED NOT DETECTED Final   Staphylococcus epidermidis NOT DETECTED NOT DETECTED Final   Staphylococcus lugdunensis NOT DETECTED NOT DETECTED Final   Streptococcus species NOT DETECTED NOT DETECTED Final   Streptococcus agalactiae NOT DETECTED NOT DETECTED Final   Streptococcus pneumoniae NOT DETECTED NOT DETECTED Final   Streptococcus pyogenes NOT DETECTED NOT DETECTED Final   A.calcoaceticus-baumannii NOT DETECTED NOT DETECTED Final   Bacteroides fragilis NOT DETECTED NOT DETECTED Final   Enterobacterales NOT DETECTED NOT DETECTED Final   Enterobacter cloacae complex NOT DETECTED NOT DETECTED Final   Escherichia coli NOT DETECTED NOT DETECTED Final   Klebsiella aerogenes NOT DETECTED NOT DETECTED Final   Klebsiella oxytoca NOT DETECTED NOT DETECTED Final   Klebsiella pneumoniae NOT DETECTED NOT DETECTED Final   Proteus species NOT DETECTED NOT DETECTED Final   Salmonella species NOT DETECTED NOT DETECTED Final   Serratia marcescens NOT DETECTED NOT DETECTED Final   Haemophilus influenzae NOT DETECTED NOT DETECTED Final   Neisseria meningitidis NOT DETECTED NOT DETECTED Final   Pseudomonas aeruginosa NOT DETECTED NOT DETECTED Final   Stenotrophomonas maltophilia NOT DETECTED NOT DETECTED Final   Candida albicans NOT DETECTED NOT DETECTED Final   Candida auris NOT DETECTED NOT DETECTED Final   Candida glabrata NOT DETECTED NOT DETECTED Final   Candida krusei NOT DETECTED NOT DETECTED Final   Candida parapsilosis NOT DETECTED NOT DETECTED Final   Candida tropicalis NOT DETECTED NOT DETECTED Final   Cryptococcus neoformans/gattii NOT DETECTED NOT DETECTED Final    Comment: Performed at [facility], [address] [place], [place] [address]      Radiology Studies: No results found.     LOS: 3 days   Karen Casey [employer] on [email or web address]  01/05/2022, 11:05 AM

## 2022-01-05 NOTE — Care Management Important Message (Signed)
Important Message  Patient Details IM Letter given to the Patient. Name: Karen Casey MRN: [id number] Date of Birth: 10-Sep-1955   Medicare Important Message Given:  Yes     Kerin Salen 01/05/2022, 3:22 PM

## 2022-01-05 NOTE — TOC Initial Note (Signed)
Transition of Care [facility]) - Initial/Assessment Note    Patient Details  Name: Karen Casey MRN: [id number] Date of Birth: 09-15-1955  Transition of Care [facility]) CM/SW Contact:    Dessa Phi, RN Phone Number: 01/05/2022, 2:24 PM  Clinical Narrative: Active w/[facility] rep Amy following HHPT/OT-await orders.                 Expected Discharge Plan: [place] Barriers to Discharge: Continued Medical Work up   Patient Goals and CMS Choice Patient states their goals for this hospitalization and ongoing recovery are:: Home CMS Medicare.gov Compare Post Acute Care list provided to:: Patient Choice offered to / list presented to : Patient  Expected Discharge Plan and Services Expected Discharge Plan: [place]   Discharge Planning Services: CM Consult Post Acute Care Choice: [place] arrangements for the past 2 months: Single Family Home                                      Prior Living Arrangements/Services Living arrangements for the past 2 months: Single Family Home Lives with:: Spouse Patient language and need for interpreter reviewed:: Yes Do you feel safe going back to the place where you live?: Yes      Need for Family Participation in Patient Care: Yes (Comment) Care giver support system in place?: Yes (comment) Current home services: DME, Home PT, Home OT (LT:JQZESP w/[facility] HHPT/OT) Criminal Activity/Legal Involvement Pertinent to Current Situation/Hospitalization: No - Comment as needed  Activities of Daily Living Home Assistive Devices/Equipment: Wheelchair, Environmental consultant (specify type), Bedside commode/3-in-1 ADL Screening (condition at time of admission) Patient's cognitive ability adequate to safely complete daily activities?: No Is the patient deaf or have difficulty hearing?: No Does the patient have difficulty seeing, even when wearing glasses/contacts?: No Does the patient have difficulty concentrating,  remembering, or making decisions?: Yes Patient able to express need for assistance with ADLs?: Yes Does the patient have difficulty dressing or bathing?: Yes Independently performs ADLs?: No Communication: Independent Dressing (OT): Needs assistance Is this a change from baseline?: Pre-admission baseline Grooming: Needs assistance Is this a change from baseline?: Pre-admission baseline Feeding: Independent Bathing: Needs assistance Is this a change from baseline?: Pre-admission baseline Toileting: Needs assistance Is this a change from baseline?: Pre-admission baseline In/Out Bed: Needs assistance Is this a change from baseline?: Pre-admission baseline Walks in Home: Dependent Is this a change from baseline?: Pre-admission baseline Does the patient have difficulty walking or climbing stairs?: Yes Weakness of Legs: Both Weakness of Arms/Hands: None  Permission Sought/Granted Permission sought to share information with : Case Manager Permission granted to share information with : Yes, Verbal Permission Granted  Share Information with NAME:  (Case Manager)           Emotional Assessment Appearance:: Appears stated age Attitude/Demeanor/Rapport: Gracious Affect (typically observed): Accepting Orientation: : Oriented to Self, Oriented to Place, Oriented to  Time, Oriented to Situation Alcohol / Substance Use: Not Applicable Psych Involvement: No (comment)  Admission diagnosis:  Colitis [K52.9] Sepsis [facility]) [A41.9] Patient Active Problem List   Diagnosis Date Noted   Sepsis ([place]) 01/02/2022   Encephalopathy acute    Acute respiratory failure with hypoxia (HCC)    Aspiration pneumonia ([place]) 11/22/2021   Right rib fracture 11/20/2021   Elevated LFTs 11/20/2021   Osteoporosis 09/23/2021   Failure to thrive in adult 09/23/2021   Protein-calorie  malnutrition, severe 07/28/2021   Vitamin D deficiency 07/03/2021   Peripheral neuropathy    Seborrheic keratoses 05/29/2021    Hepatic steatosis 01/05/2021   Hypokalemia 01/02/2021   COPD (chronic obstructive pulmonary disease) ([place]) 12/09/2020   Chronic pain of breast 11/12/2020   Compression fracture of body of thoracic vertebra ([place]) 11/06/2020   SIADH (syndrome of inappropriate ADH production) ([place]) 10/12/2020   Protein-calorie malnutrition ([place]) 09/17/2020   Paroxysmal atrial fibrillation (HCC)    Shortness of breath    Alcohol withdrawal ([place]) 09/10/2020   Pulmonary nodules/lesions, multiple 07/14/2020   HTN (hypertension) 06/02/2018   Left foot pain 08/23/2017   Body mass index (BMI) 31.0-31.9, adult 03/29/2013   Community acquired pneumonia 03/14/2013   Anxiety disorder 06/12/2012   Alcohol use 05/28/2012   Benzodiazepine withdrawal without complication ([place]) 80/99/8338   Tobacco abuse 02/17/2012   GERD (gastroesophageal reflux disease) 02/04/2012   Nausea vomiting and diarrhea 02/04/2012   Depression 02/04/2012   PCP:  [name], MD Pharmacy:   [id number] [id number]: [phone]: [phone] [id number]: [phone]: [phone]    Social Determinants of Health (SDOH) Interventions    Readmission Risk Interventions    01/03/2022   11:30 AM 12/01/2021    3:19 PM 11/23/2021    2:58 PM  Readmission Risk Prevention Plan  Transportation Screening Complete Complete   Medication Review (RN Care Manager) Complete Complete Complete  PCP or Specialist appointment within 3-5 days of discharge Complete Complete   HRI or Home Care Consult Complete Complete Complete  SW Recovery Care/Counseling Consult Complete Complete Complete  Palliative Care Screening Not Applicable Not Applicable Not AF[place]astleNot Applicable Patient Refused Not Complete  SNF Comments    Patient's orientation is fluctuating and patient was in mittens in last 24 hours.

## 2022-01-05 NOTE — Progress Notes (Signed)
Pt pulled out her IV. IV team came to bedside to start a new IV. Pt refused to have IV started after one attempt made by IV team. Pt is aware that she has orders for IV medication but she still refuses to have a new IV started at this time.

## 2022-01-05 NOTE — Plan of Care (Signed)
  Problem: Health Behavior/Discharge Planning: Goal: Ability to manage health-related needs will improve Outcome: Progressing   Problem: Clinical Measurements: Goal: Ability to maintain clinical measurements within normal limits will improve Outcome: Progressing Goal: Will remain free from infection Outcome: Progressing Goal: Diagnostic test results will improve Outcome: Progressing   Problem: Activity: Goal: Risk for activity intolerance will decrease Outcome: Progressing   Problem: Nutrition: Goal: Adequate nutrition will be maintained Outcome: Progressing   Problem: Coping: Goal: Level of anxiety will decrease Outcome: Progressing   Problem: Elimination: Goal: Will not experience complications related to bowel motility Outcome: Progressing Goal: Will not experience complications related to urinary retention Outcome: Progressing   Problem: Pain Managment: Goal: General experience of comfort will improve Outcome: Progressing   Problem: Safety: Goal: Ability to remain free from injury will improve Outcome: Progressing   Problem: Skin Integrity: Goal: Risk for impaired skin integrity will decrease Outcome: Progressing   Problem: Fluid Volume: Goal: Hemodynamic stability will improve Outcome: Progressing   Problem: Clinical Measurements: Goal: Diagnostic test results will improve Outcome: Progressing Goal: Signs and symptoms of infection will decrease Outcome: Progressing   Problem: Respiratory: Goal: Ability to maintain adequate ventilation will improve Outcome: Progressing

## 2022-01-06 ENCOUNTER — Ambulatory Visit: Payer: Self-pay | Admitting: Licensed Clinical Social Worker

## 2022-01-06 ENCOUNTER — Inpatient Hospital Stay (HOSPITAL_COMMUNITY): Payer: Medicare Other

## 2022-01-06 DIAGNOSIS — F419 Anxiety disorder, unspecified: Secondary | ICD-10-CM

## 2022-01-06 DIAGNOSIS — R262 Difficulty in walking, not elsewhere classified: Secondary | ICD-10-CM | POA: Diagnosis present

## 2022-01-06 DIAGNOSIS — A409 Streptococcal sepsis, unspecified: Secondary | ICD-10-CM

## 2022-01-06 DIAGNOSIS — R7881 Bacteremia: Secondary | ICD-10-CM

## 2022-01-06 DIAGNOSIS — R1310 Dysphagia, unspecified: Secondary | ICD-10-CM

## 2022-01-06 DIAGNOSIS — B952 Enterococcus as the cause of diseases classified elsewhere: Secondary | ICD-10-CM

## 2022-01-06 DIAGNOSIS — R112 Nausea with vomiting, unspecified: Secondary | ICD-10-CM

## 2022-01-06 DIAGNOSIS — R197 Diarrhea, unspecified: Secondary | ICD-10-CM

## 2022-01-06 DIAGNOSIS — F32A Depression, unspecified: Secondary | ICD-10-CM

## 2022-01-06 LAB — ECHOCARDIOGRAM COMPLETE
Area-P 1/2: 4.79 cm2
Calc EF: 59.4 %
Height: 63 in
S' Lateral: 2.8 cm
Single Plane A2C EF: 60.9 %
Single Plane A4C EF: 57.5 %
Weight: 2109.36 oz

## 2022-01-06 LAB — CBC WITH DIFFERENTIAL/PLATELET
Abs Immature Granulocytes: 0.03 10*3/uL (ref 0.00–0.07)
Basophils Absolute: 0 10*3/uL (ref 0.0–0.1)
Basophils Relative: 0 %
Eosinophils Absolute: 0.1 10*3/uL (ref 0.0–0.5)
Eosinophils Relative: 1 %
HCT: 37.9 % (ref 36.0–46.0)
Hemoglobin: 12.2 g/dL (ref 12.0–15.0)
Immature Granulocytes: 0 %
Lymphocytes Relative: 19 %
Lymphs Abs: 1.5 10*3/uL (ref 0.7–4.0)
MCH: 32.2 pg (ref 26.0–34.0)
MCHC: 32.2 g/dL (ref 30.0–36.0)
MCV: 100 fL (ref 80.0–100.0)
Monocytes Absolute: 0.5 10*3/uL (ref 0.1–1.0)
Monocytes Relative: 6 %
Neutro Abs: 5.8 10*3/uL (ref 1.7–7.7)
Neutrophils Relative %: 74 %
Platelets: 163 10*3/uL (ref 150–400)
RBC: 3.79 MIL/uL — ABNORMAL LOW (ref 3.87–5.11)
RDW: 13.4 % (ref 11.5–15.5)
WBC: 7.9 10*3/uL (ref 4.0–10.5)
nRBC: 0 % (ref 0.0–0.2)

## 2022-01-06 LAB — BASIC METABOLIC PANEL
Anion gap: 5 (ref 5–15)
BUN: <5 mg/dL — ABNORMAL LOW (ref 8–23)
CO2: 18 mmol/L — ABNORMAL LOW (ref 22–32)
Calcium: 8 mg/dL — ABNORMAL LOW (ref 8.9–10.3)
Chloride: 110 mmol/L (ref 98–111)
Creatinine, Ser: 0.56 mg/dL (ref 0.44–1.00)
GFR, Estimated: >60 mL/min (ref >60)
Glucose, Bld: 77 mg/dL (ref 70–99)
Potassium: 5 mmol/L (ref 3.5–5.1)
Sodium: 133 mmol/L — ABNORMAL LOW (ref 135–145)

## 2022-01-06 LAB — MAGNESIUM: Magnesium: 1.8 mg/dL (ref 1.7–2.4)

## 2022-01-06 MED ORDER — LINEZOLID 600 MG PO TABS
600.0000 mg | ORAL_TABLET | Freq: Two times a day (BID) | ORAL | Status: DC
  Administered 2022-01-06 – 2022-01-07 (×2): 600 mg via ORAL
  Filled 2022-01-06 (×3): qty 1

## 2022-01-06 MED ORDER — SODIUM CHLORIDE 0.9 % IV SOLN
3.0000 g | Freq: Four times a day (QID) | INTRAVENOUS | Status: DC
  Filled 2022-01-06 (×2): qty 8

## 2022-01-06 MED ORDER — NICOTINE 21 MG/24HR TD PT24
21.0000 mg | MEDICATED_PATCH | Freq: Every day | TRANSDERMAL | Status: DC
  Administered 2022-01-06 – 2022-01-07 (×2): 21 mg via TRANSDERMAL
  Filled 2022-01-06 (×2): qty 1

## 2022-01-06 MED ORDER — AMOXICILLIN-POT CLAVULANATE 875-125 MG PO TABS: 1.0000 | ORAL_TABLET | Freq: Two times a day (BID) | ORAL | Status: DC

## 2022-01-06 NOTE — Progress Notes (Addendum)
[facility] PROGRESS NOTE   Karen Casey BPZ:[id number] DOB: Nov 02, 1955 DOA: 01/02/2022  PCP: [name], MD  Brief History/Interval Summary: 66 y.o. female with medical history significant of GERD, PAF, EtOH abuse, tobacco abuse, COPD, anxiety/depression.  Presented with 3-day history of diarrhea.  CT scan suggested enterocolitis.  Recent antibiotic use reported by patient for UTI.  Patient hospitalized for further management.  C. difficile panel and GI panel were negative.  Diarrhea improved or even resolved.  Consultants: None  Procedures: None    Subjective/Interval History: Patient seen along with PT in the room.  Reports that she has been having a BM every 1.5 days, formed BM and denies loose or watery stools.  Had refused labs earlier but then allowed lab work.  Insistent on being discharged but agreeable to staying for SNF.  No abdominal pain.  Reports dysphagia.   Assessment/Plan:  Acute enterocolitis/severe diarrhea/sepsis present at admission She had leukocytosis and tachycardia at the time of admission.  She was afebrile.  Lactic acid level noted to be mildly elevated.  Partly due to hypovolemia. Recent antibiotic use for urinary tract infection. C. difficile is noted to be negative.  GI pathogen panel also negative.  Enteric precautions discontinued. Patient had WBC of 21,000 at the time of admission.  She was started on ceftriaxone and metronidazole empirically.   Leukocytosis resolved. CT scan suggested enterocolitis without any other acute findings.   Completed 4 days of ceftriaxone and metronidazole as of 7/25.  IV has fallen off and patient refusing reinsertion.  Imodium single dose on 7/24 may have helped.  Continue probiotics as well. Transitioned to Augmentin to complete total 7 days course.  Enterococcus Avium Bacteremia 1 out of 2 bottles showed Enterococcus avium.  ID consultation appreciated.  Checking TTE to rule out endocarditis.  IV team  unable to get IV access.  Thereby ID has placed patient on Zyvox and if echo without endocarditis then Zyvox x2 weeks and follow-up with outpatient ID.  Probiotics discontinued.  BuSpar stopped due to concern for serotonin syndrome with antidepressants and Zyvox.  Severe hypokalemia/hypomagnesemia Patient actually had mild hyperkalemia on 7/25.  Potassium now at upper limits of normal/5.0.  Hypomagnesemia replaced.  Normocytic anemia Drop in hemoglobin is likely dilutional.  No overt bleeding noted.  Hemoglobin has come up from 11.4-12.2.  Paroxysmal atrial fibrillation EKG suggests sinus tachycardia with PACs.  Not on chronic anticoagulation as determined by her outpatient providers.  Continue to monitor on telemetry for now.  Does not appear to be on rate control medications.  History of COPD Stable respiratory status noted.  Essential hypertension Monitor blood pressures closely.  Currently not on any antihypertensives.  Blood pressure is reasonably well controlled with occasional mildly uncontrolled readings.  History of peripheral neuropathy/neuromuscular disease/ambulatory dysfunction Nonambulatory at baseline.  Uses a wheelchair to get around. As per extensive communication by patient's PCP since 7/26, PCP reached out to [facility] provider yesterday to advise that the office had been trying to get her into a SNF from outpatient as she was unable to take care of herself at home and recommended SNF at discharge from the hospital.  Although patient lives with her spouse, he cannot lift her so she reportedly mostly sits in bed and has BM there, unable to walk due to her polyneuropathy and back pain and depression and she reportedly has given up on trying and is never able to make it into clinic as patient's spouse cannot lift her into a car.  Therapies now recommend SNF and patient is agreeable.  TOC consulted for SNF bed.  Previous history of alcohol and tobacco abuse Patient denies current  use.  History of depression and anxiety Resumed home medications.  Noted to be on BuSpar.  Ambulatory dysfunction ambulatory dysfunction Reports ongoing for the last 3 to 4 days since hospital admission.  Has been tolerating applesauce and some soft foods.  Throat exam with a tongue depressor without acute findings.  SLP evaluation requested.   DVT Prophylaxis: Lovenox Code Status: Full code Family Communication: None at bedside. Disposition Plan: Stable for DC to SNF pending bed and insurance.  TOC consulted.      Medications: Scheduled:  busPIRone  10 mg Oral Q1500   enoxaparin (LOVENOX) injection  40 mg Subcutaneous Q24H   feeding supplement  237 mL Oral Daily   fluticasone furoate-vilanterol  1 puff Inhalation Daily   And   umeclidinium bromide  1 puff Inhalation Daily   folic acid  1 mg Oral Daily   gabapentin  600 mg Oral TID   liver oil-zinc oxide   Topical Daily   multivitamin with minerals  1 tablet Oral Daily   nortriptyline  75 mg Oral QHS   pantoprazole  40 mg Oral q morning   saccharomyces boulardii  250 mg Oral BID   Continuous:  cefTRIAXone (ROCEPHIN)  IV Stopped (01/05/22 1600)   metronidazole Stopped (01/06/22 0504)   MWN:[id number] **OR** acetaminophen, albuterol, alum & mag hydroxide-simeth, loperamide, oxyCODONE, polyvinyl alcohol, prochlorperazine, sodium chloride flush  Antibiotics: Anti-infectives (From admission, onward)    Start     Dose/Rate Route Frequency Ordered Stop   01/05/22 0600  metroNIDAZOLE (FLAGYL) tablet 500 mg        500 mg Oral  Once 01/05/22 0502 01/05/22 0611   01/02/22 1400  cefTRIAXone (ROCEPHIN) 2 g in sodium chloride 0.9 % 100 mL IVPB        2 g 200 mL/hr over 30 Minutes Intravenous Every 24 hours 01/02/22 1350 01/09/22 1359   01/02/22 1400  metroNIDAZOLE (FLAGYL) IVPB 500 mg        500 mg 100 mL/hr over 60 Minutes Intravenous Every 12 hours 01/02/22 1350 01/09/22 1659       Objective:  Vital Signs  Vitals:    01/06/22 0755 01/06/22 0830 01/06/22 0915 01/06/22 1257  BP:    (!) 140/100  Pulse:    (!) 104  Resp:  (!) '23 19 20  '$ Temp:    98.1 F (36.7 C)  TempSrc:    Oral  SpO2: 97%   100%  Weight:      Height:        Intake/Output Summary (Last 24 hours) at 01/06/2022 1313 Last data filed at 01/06/2022 7425 Gross per 24 hour  Intake 931.9 ml  Output 600 ml  Net 331.9 ml   Filed Weights   01/02/22 0928 01/02/22 1606  Weight: 74.8 kg 59.8 kg    General appearance: Middle-age female, moderately built and poorly nourished, looks older than stated age, sitting up comfortably at edge of bed without distress. Resp: Clear to auscultation.  No increased work of breathing. Cardio: S1 and S2 heard, RRR.  No JVD, murmurs or pedal edema.  Telemetry personally reviewed: Mostly sinus rhythm.  Occasional mild ST in the 100s. GI: Abdomen is soft.  Nontender nondistended.  Bowel sounds are present normal.  No masses organomegaly Extremities: No edema.       Lab Results:  Data Reviewed: I have personally  reviewed following labs and reports of the imaging studies  CBC: Recent Labs  Lab 01/02/22 1035 01/03/22 0903 01/04/22 0455 01/06/22 1104  WBC 21.1* 12.1* 8.0 7.9  NEUTROABS 16.6* 9.3*  --  5.8  HGB 15.6* 11.9* 11.4* 12.2  HCT 45.5 34.7* 34.5* 37.9  MCV 92.9 94.8 96.4 100.0  PLT 260 153 152 825    Basic Metabolic Panel: Recent Labs  Lab 01/02/22 1035 01/03/22 0903 01/04/22 0455 01/05/22 1033 01/05/22 1728 01/06/22 0637  NA 134* 138 134* 130*  --  133*  K 3.5 2.6* 3.6 5.7*  5.5* 5.1 5.0  CL 100 107 109 108  --  110  CO2 21* 23 20* 19*  --  18*  GLUCOSE 121* 84 81 108*  --  77  BUN '11 8 8 '$ 6*  --  <5*  CREATININE 0.87 0.63 0.43* 0.44  --  0.56  CALCIUM 8.2* 7.9* 7.6* 7.3*  --  8.0*  MG 1.5*  --  1.4* 2.1  --  1.8    GFR: Estimated Creatinine Clearance: 59.5 mL/min (by C-G formula based on SCr of 0.56 mg/dL).  Liver Function Tests: Recent Labs  Lab 01/02/22 1035  01/03/22 0903 01/04/22 0455  AST 33 21 19  ALT '20 17 16  '$ ALKPHOS 112 87 85  BILITOT 0.8 0.7 0.5  PROT 5.7* 4.5* 4.4*  ALBUMIN 2.4* 1.8* 1.8*    Recent Labs  Lab 01/02/22 1035  LIPASE 22    Coagulation Profile: Recent Labs  Lab 01/03/22 0903  INR 1.4*     Recent Results (from the past 240 hour(s))  Gastrointestinal Panel by PCR , Stool     Status: None   Collection Time: 01/02/22  1:12 PM   Specimen: STOOL  Result Value Ref Range Status   Campylobacter species NOT DETECTED NOT DETECTED Final   Plesimonas shigelloides NOT DETECTED NOT DETECTED Final   Salmonella species NOT DETECTED NOT DETECTED Final   Yersinia enterocolitica NOT DETECTED NOT DETECTED Final   Vibrio species NOT DETECTED NOT DETECTED Final   Vibrio cholerae NOT DETECTED NOT DETECTED Final   Enteroaggregative E coli (EAEC) NOT DETECTED NOT DETECTED Final   Enteropathogenic E coli (EPEC) NOT DETECTED NOT DETECTED Final   Enterotoxigenic E coli (ETEC) NOT DETECTED NOT DETECTED Final   Shiga like toxin producing E coli (STEC) NOT DETECTED NOT DETECTED Final   Shigella/Enteroinvasive E coli (EIEC) NOT DETECTED NOT DETECTED Final   Cryptosporidium NOT DETECTED NOT DETECTED Final   Cyclospora cayetanensis NOT DETECTED NOT DETECTED Final   Entamoeba histolytica NOT DETECTED NOT DETECTED Final   Giardia lamblia NOT DETECTED NOT DETECTED Final   Adenovirus F40/41 NOT DETECTED NOT DETECTED Final   Astrovirus NOT DETECTED NOT DETECTED Final   Norovirus GI/GII NOT DETECTED NOT DETECTED Final   Rotavirus A NOT DETECTED NOT DETECTED Final   Sapovirus (I, II, IV, and V) NOT DETECTED NOT DETECTED Final    Comment: Performed at [facility], [place] [place], [place] [address]  C Difficile Quick Screen w PCR reflex     Status: None   Collection Time: 01/02/22  1:34 PM   Specimen: STOOL  Result Value Ref Range Status   C Diff antigen NEGATIVE NEGATIVE Final   C Diff toxin NEGATIVE NEGATIVE Final    C Diff interpretation No C. difficile detected.  Final    Comment: Performed at [facility], [place] [address] [place], [place] [address]  Culture, blood (Routine X 2)  w Reflex to ID Panel     Status: Abnormal (Preliminary result)   Collection Time: 01/02/22  4:48 PM   Specimen: BLOOD RIGHT HAND  Result Value Ref Range Status   Specimen Description   Final    BLOOD RIGHT HAND Performed at [facility], [place] [address] [place], [place] [address]    Special Requests   Final    IN PEDIATRIC BOTTLE Blood Culture adequate volume Performed at [facility] [address], [place], [place] [address]    Culture  Setup Time   Final    GRAM POSITIVE COCCI IN CHAINS IN PEDIATRIC BOTTLE CRITICAL RESULT CALLED TO, READ BACK BY AND VERIFIED WITH: RN Alyson Locket [phone] '@[phone]'$  FH    Culture (A)  Final    ENTEROCOCCUS AVIUM SUSCEPTIBILITIES TO FOLLOW Performed at [facility], [place] [address], [place], [place] [address]    Report Status PENDING  Incomplete  Culture, blood (Routine X 2) w Reflex to ID Panel     Status: None (Preliminary result)   Collection Time: 01/02/22  4:48 PM   Specimen: BLOOD RIGHT HAND  Result Value Ref Range Status   Specimen Description   Final    BLOOD RIGHT HAND Performed at [facility], [place] [address] [place], [place] [address]    Special Requests   Final    BOTTLES DRAWN AEROBIC ONLY Blood Culture adequate volume Performed at [place] [address] [place], [place] [address]    Culture   Final    NO GROWTH 4 DAYS Performed at [facility], [place] [address], [place], [place] [address]    Report Status PENDING  Incomplete  Blood Culture ID Panel (Reflexed)     Status: None   Collection Time: 01/02/22  4:48 PM  Result Value Ref Range Status   Enterococcus faecalis NOT DETECTED NOT DETECTED Final   Enterococcus Faecium NOT DETECTED NOT DETECTED Final   Listeria monocytogenes NOT  DETECTED NOT DETECTED Final   Staphylococcus species NOT DETECTED NOT DETECTED Final   Staphylococcus aureus (BCID) NOT DETECTED NOT DETECTED Final   Staphylococcus epidermidis NOT DETECTED NOT DETECTED Final   Staphylococcus lugdunensis NOT DETECTED NOT DETECTED Final   Streptococcus species NOT DETECTED NOT DETECTED Final   Streptococcus agalactiae NOT DETECTED NOT DETECTED Final   Streptococcus pneumoniae NOT DETECTED NOT DETECTED Final   Streptococcus pyogenes NOT DETECTED NOT DETECTED Final   A.calcoaceticus-baumannii NOT DETECTED NOT DETECTED Final   Bacteroides fragilis NOT DETECTED NOT DETECTED Final   Enterobacterales NOT DETECTED NOT DETECTED Final   Enterobacter cloacae complex NOT DETECTED NOT DETECTED Final   Escherichia coli NOT DETECTED NOT DETECTED Final   Klebsiella aerogenes NOT DETECTED NOT DETECTED Final   Klebsiella oxytoca NOT DETECTED NOT DETECTED Final   Klebsiella pneumoniae NOT DETECTED NOT DETECTED Final   Proteus species NOT DETECTED NOT DETECTED Final   Salmonella species NOT DETECTED NOT DETECTED Final   Serratia marcescens NOT DETECTED NOT DETECTED Final   Haemophilus influenzae NOT DETECTED NOT DETECTED Final   Neisseria meningitidis NOT DETECTED NOT DETECTED Final   Pseudomonas aeruginosa NOT DETECTED NOT DETECTED Final   Stenotrophomonas maltophilia NOT DETECTED NOT DETECTED Final   Candida albicans NOT DETECTED NOT DETECTED Final   Candida auris NOT DETECTED NOT DETECTED Final   Candida glabrata NOT DETECTED NOT DETECTED Final   Candida krusei NOT DETECTED NOT DETECTED Final   Candida parapsilosis NOT DETECTED NOT DETECTED  Final   Candida tropicalis NOT DETECTED NOT DETECTED Final   Cryptococcus neoformans/gattii NOT DETECTED NOT DETECTED Final    Comment: Performed at [facility], [place] [address] [place], [place] [address]      Radiology Studies: No results found.     LOS: 4 days   Vernell Leep, MD,  FACP, [facility], [facility], [facility] (Care  Management Physician Certified) [facility] [place]  To contact the attending provider between 7A-7P or the covering provider during after hours 7P-7A, please log into the web site [email or web address] and access using universal [place] password for that web site. If you do not have the password, please call the hospital operator.

## 2022-01-06 NOTE — TOC PASRR Note (Signed)
Transition of Care (TOC) -30 day Note       Patient Details   Name: Karen Casey  BBC:[id number]  Date of Birth:03/01/1956     Transition of Care [facility]) CM/SW Contact   Name:Ramir Malerba Stewart   Phone Number:[phone]  Date:01/06/2022  Time:1:51p     MUST CM:[id number]     To Whom it May Concern:     Please be advised that the above patient will require a short-term nursing home stay, anticipated 30 days or less rehabilitation and strengthening. The plan is for return home.

## 2022-01-06 NOTE — Progress Notes (Signed)
VAST consulted to obtain IV access. Assessed bilateral arms utilizing ultrasound; attempted to start 24G IV x1, unsuccessful. Patient does not have appropriate vessels for USGIV placement. Notified unit RN

## 2022-01-06 NOTE — Consult Note (Signed)
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 600 mg every 12 for now 2D echocardiogram Stop BuSpar for now given concern for serotonin syndrome with antidepressants and Zyvox If 2D echocardiogram fails to show evidence of endocarditis I would be comfortable giving her 2-week course of Zyvox followed by visit with me 2 weeks later to check surveillance blood cultures DC probiotics given risk of bacteremia from them Sister Ilene Qua is under impression that patient will be placed in rehab given her significant physical limitations and limited capacity of his sig other to move her about  Active Problems:   GERD (gastroesophageal reflux disease)   Nausea vomiting and diarrhea   Depression   Anxiety disorder   HTN (hypertension)   Paroxysmal atrial fibrillation (HCC)   Protein-calorie malnutrition ([place])   COPD (chronic obstructive pulmonary disease) ([place])   Sepsis ([place])   Dysphagia   Ambulatory dysfunction   Scheduled Meds:  enoxaparin (LOVENOX) injection  40 mg Subcutaneous Q24H   feeding supplement  237 mL Oral Daily   fluticasone furoate-vilanterol  1 puff Inhalation Daily   And   umeclidinium bromide  1 puff Inhalation Daily   folic acid  1 mg Oral Daily   gabapentin  600 mg Oral TID   linezolid  600 mg Oral Q12H   liver oil-zinc oxide   Topical Daily   multivitamin with minerals  1 tablet Oral Daily   nicotine  21 mg Transdermal Daily   nortriptyline  75 mg Oral QHS   pantoprazole  40 mg Oral q morning   Continuous Infusions: PRN Meds:.acetaminophen **OR** acetaminophen, albuterol, alum & mag hydroxide-simeth, loperamide,  oxyCODONE, polyvinyl alcohol, prochlorperazine, sodium chloride flush  HPI: Karen Casey is a 66 y.o. female with past med history significant for alcoholism which she stopped 6 to 8 months ago and went through significant withdrawal paroxysmal atrial fibrillation tobacco abuse COPD anxiety depression who [facility] with uncontrollable diarrhea along with nausea and vomiting.  CT in the ER showed evidence of enterocolitis.  Cultures were taken and the patient was placed on ceftriaxone and tonight is all.  She has improved clinically IV access with subsequent loss one of the 2 admission blood cultures is now growing Enterococcus avium.  Given presence of Enterococcus we need to at least make some attempts to exclude endocarditis.  I have therefore ordered a 2D echocardiogram.  Because she has lost IV access I have placed her on Zyvox orally at 600 mg twice daily.  To abrogate risk of serotonin syndrome we are stopping her BuSpar for now of note she had said that she had wanted to come off of her antidepressants in any case that this should be done in a careful manner.  If her 2D echocardiogram does not show evidence of endocarditis I will be comfortable giving her 2 weeks course of Zyvox with follow-up in my clinic 2 weeks from stopping antibiotics to check surveillance blood cultures.  I spent 77mnutes with the patient including than 50% of the time in face to face counseling of the patient bloodstream infection her enteric infection history of alcohol abuse, personally  reviewing CT abdomen pelvis with updated culture data along with review of medical records in preparation for the visit and during the visit and in coordination of her care with Dr. Algis Liming and with discussions with sister per patients request.    Review of Systems: Review of Systems  Constitutional:  Positive for fever, malaise/fatigue and weight loss. Negative for chills.  HENT:  Negative for  congestion and sore throat.   Eyes:  Negative for blurred vision and photophobia.  Respiratory:  Negative for cough, shortness of breath and wheezing.   Cardiovascular:  Negative for chest pain, palpitations and leg swelling.  Gastrointestinal:  Positive for abdominal pain, diarrhea, nausea and vomiting. Negative for blood in stool, constipation, heartburn and melena.  Genitourinary:  Negative for dysuria, flank pain and hematuria.  Musculoskeletal:  Negative for back pain, falls, joint pain and myalgias.  Skin:  Negative for itching and rash.  Neurological:  Positive for weakness. Negative for dizziness, focal weakness, loss of consciousness and headaches.  Endo/Heme/Allergies:  Does not bruise/bleed easily.  Psychiatric/Behavioral:  Positive for depression. Negative for suicidal ideas. The patient does not have insomnia.     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   Seizure ([place])    08/2020 per patient   Seizures ([place])    xanax withdrawl- December 2013   Urinary incontinence 06/09/2012    Social History   Tobacco Use   Smoking status: Every Day    Packs/day: 0.50    Years: 43.00    Total pack years: 21.50    Types: Cigarettes   Smokeless tobacco: Never   Tobacco comments:    Tobacco info given  Vaping Use   Vaping Use: Never used  Substance Use Topics   Alcohol use: Yes    Alcohol/week: 3.0 standard drinks of alcohol    Types: 3 Cans  of beer per week    Comment: 1-2 times week just beer   Drug use: Yes    Types: Marijuana, Other-see comments    Comment: Past hx of benzo abuse--xanax    Family History  Problem Relation Age of Onset   Hypertension Mother    Hyperlipidemia Mother    Aneurysm Mother        Rupture - Cause of death   Heart disease Father        MI - cause of death   Depression Father    Parkinsonism Father    Hypertension Sister    Colon cancer Neg Hx    Esophageal cancer Neg Hx    Rectal cancer Neg Hx    Stomach cancer Neg Hx    Allergies  Allergen Reactions   Capsaicin-Menthol Other (See Comments)    Burning and peeling on area applied to   Diclo Gel [Diclofenac Sodium] Other (See Comments)    GEL AND CREAM-burning and peeling  at the sight of application.    OBJECTIVE: Blood pressure (!) 140/100, pulse (!) 104, temperature 98.1 F (36.7 C), temperature source Oral, resp. rate 20, height '5\' 3"'$  (1.6 m), weight 59.8 kg, SpO2 100 %.  Physical Exam Constitutional:      General: She is not in acute distress.    Appearance: Normal appearance. She is well-developed. She is obese. She is ill-appearing. She is not diaphoretic.  HENT:     Head: Normocephalic and atraumatic.     Right Ear: Hearing and external ear normal.     Left Ear: Hearing and external ear normal.     Nose: No nasal deformity or rhinorrhea.  Eyes:     General: No scleral icterus.    Conjunctiva/sclera: Conjunctivae normal.     Right eye: Right conjunctiva is not injected.     Left eye: Left conjunctiva is not injected.     Pupils: Pupils are equal, round, and reactive to light.  Neck:     Vascular: No JVD.  Cardiovascular:     Rate and Rhythm: Normal rate. Rhythm irregular.     Heart sounds: Normal heart sounds, S1 normal and S2 normal. No murmur heard.    No friction rub. No gallop.  Pulmonary:     Effort: No respiratory distress.     Breath sounds: No stridor. No wheezing or rales.  Abdominal:     General: Bowel  sounds are normal. There is no distension.     Palpations: Abdomen is soft.     Tenderness: There is no abdominal tenderness.  Musculoskeletal:        General: Normal range of motion.     Right shoulder: Normal.     Left shoulder: Normal.     Cervical back: Normal range of motion and neck supple.     Right hip: Normal.     Left hip: Normal.     Right knee: Normal.     Left knee: Normal.  Lymphadenopathy:     Head:     Right side of head: No submandibular, preauricular or posterior auricular adenopathy.     Left side of head: No submandibular, preauricular or posterior auricular adenopathy.     Cervical: No cervical adenopathy.     Right cervical: No superficial or deep cervical adenopathy.    Left cervical: No superficial or deep cervical adenopathy.  Skin:    General: Skin is warm and dry.     Coloration: Skin is not pale.     Findings: No abrasion, bruising, ecchymosis, erythema, lesion or rash.     Nails: There is no clubbing.  Neurological:     General: No focal deficit present.     Mental Status: She is alert and oriented to person, place, and time.     Sensory: No sensory deficit.     Coordination: Coordination normal.     Gait: Gait normal.  Psychiatric:        Attention and Perception: Attention normal. She is attentive.        Mood and Affect: Affect normal. Mood is anxious.        Speech: Speech normal.        Behavior: Behavior normal. Behavior is cooperative.        Thought Content: Thought content normal.        Judgment: Judgment normal.     Lab Results Lab Results  Component Value Date   WBC 7.9 01/06/2022   HGB 12.2 01/06/2022   HCT 37.9  01/06/2022   MCV 100.0 01/06/2022   PLT 163 01/06/2022    Lab Results  Component Value Date   CREATININE 0.56 01/06/2022   BUN <5 (L) 01/06/2022   NA 133 (L) 01/06/2022   K 5.0 01/06/2022   CL 110 01/06/2022   CO2 18 (L) 01/06/2022    Lab Results  Component Value Date   ALT 16 01/04/2022   AST 19 01/04/2022    ALKPHOS 85 01/04/2022   BILITOT 0.5 01/04/2022     Microbiology: Recent Results (from the past 240 hour(s))  Gastrointestinal Panel by PCR , Stool     Status: None   Collection Time: 01/02/22  1:12 PM   Specimen: STOOL  Result Value Ref Range Status   Campylobacter species NOT DETECTED NOT DETECTED Final   Plesimonas shigelloides NOT DETECTED NOT DETECTED Final   Salmonella species NOT DETECTED NOT DETECTED Final   Yersinia enterocolitica NOT DETECTED NOT DETECTED Final   Vibrio species NOT DETECTED NOT DETECTED Final   Vibrio cholerae NOT DETECTED NOT DETECTED Final   Enteroaggregative E coli (EAEC) NOT DETECTED NOT DETECTED Final   Enteropathogenic E coli (EPEC) NOT DETECTED NOT DETECTED Final   Enterotoxigenic E coli (ETEC) NOT DETECTED NOT DETECTED Final   Shiga like toxin producing E coli (STEC) NOT DETECTED NOT DETECTED Final   Shigella/Enteroinvasive E coli (EIEC) NOT DETECTED NOT DETECTED Final   Cryptosporidium NOT DETECTED NOT DETECTED Final   Cyclospora cayetanensis NOT DETECTED NOT DETECTED Final   Entamoeba histolytica NOT DETECTED NOT DETECTED Final   Giardia lamblia NOT DETECTED NOT DETECTED Final   Adenovirus F40/41 NOT DETECTED NOT DETECTED Final   Astrovirus NOT DETECTED NOT DETECTED Final   Norovirus GI/GII NOT DETECTED NOT DETECTED Final   Rotavirus A NOT DETECTED NOT DETECTED Final   Sapovirus (I, II, IV, and V) NOT DETECTED NOT DETECTED Final    Comment: Performed at [facility], [address] [place], [place] [address]  C Difficile Quick Screen w PCR reflex     Status: None   Collection Time: 01/02/22  1:34 PM   Specimen: STOOL  Result Value Ref Range Status   C Diff antigen NEGATIVE NEGATIVE Final   C Diff toxin NEGATIVE NEGATIVE Final   C Diff interpretation No C. difficile detected.  Final    Comment: Performed at [facility], [place] [address], [place], [place] [address]  Culture, blood (Routine X 2) w Reflex to ID  Panel     Status: Abnormal (Preliminary result)   Collection Time: 01/02/22  4:48 PM   Specimen: BLOOD RIGHT HAND  Result Value Ref Range Status   Specimen Description   Final    BLOOD RIGHT HAND Performed at [facility], [place] [address] [place], [place] [address]    Special Requests   Final    IN PEDIATRIC BOTTLE Blood Culture adequate volume Performed at [place] [address] [place], [place] [address]    Culture  Setup Time   Final    GRAM POSITIVE COCCI IN CHAINS IN PEDIATRIC BOTTLE CRITICAL RESULT CALLED TO, READ BACK BY AND VERIFIED WITH: RN Alyson Locket [phone] '@[phone]'$  FH    Culture (A)  Final    ENTEROCOCCUS AVIUM SUSCEPTIBILITIES TO FOLLOW Performed at [facility], [place] [address], [place], [place] [address]    Report Status PENDING  Incomplete  Culture, blood (Routine X 2) w Reflex to ID Panel     Status: None (  Preliminary result)   Collection Time: 01/02/22  4:48 PM   Specimen: BLOOD RIGHT HAND  Result Value Ref Range Status   Specimen Description   Final    BLOOD RIGHT HAND Performed at [facility], [place] [address] [place], [place] [address]    Special Requests   Final    BOTTLES DRAWN AEROBIC ONLY Blood Culture adequate volume Performed at [place] [address], [place], [place] [address]    Culture   Final    NO GROWTH 4 DAYS Performed at [facility], [place] [address], [place], [place] [address]    Report Status PENDING  Incomplete  Blood Culture ID Panel (Reflexed)     Status: None   Collection Time: 01/02/22  4:48 PM  Result Value Ref Range Status   Enterococcus faecalis NOT DETECTED NOT DETECTED Final   Enterococcus Faecium NOT DETECTED NOT DETECTED Final   Listeria monocytogenes NOT DETECTED NOT DETECTED Final   Staphylococcus species NOT DETECTED NOT DETECTED Final   Staphylococcus aureus (BCID) NOT DETECTED NOT DETECTED Final   Staphylococcus epidermidis NOT DETECTED NOT DETECTED  Final   Staphylococcus lugdunensis NOT DETECTED NOT DETECTED Final   Streptococcus species NOT DETECTED NOT DETECTED Final   Streptococcus agalactiae NOT DETECTED NOT DETECTED Final   Streptococcus pneumoniae NOT DETECTED NOT DETECTED Final   Streptococcus pyogenes NOT DETECTED NOT DETECTED Final   A.calcoaceticus-baumannii NOT DETECTED NOT DETECTED Final   Bacteroides fragilis NOT DETECTED NOT DETECTED Final   Enterobacterales NOT DETECTED NOT DETECTED Final   Enterobacter cloacae complex NOT DETECTED NOT DETECTED Final   Escherichia coli NOT DETECTED NOT DETECTED Final   Klebsiella aerogenes NOT DETECTED NOT DETECTED Final   Klebsiella oxytoca NOT DETECTED NOT DETECTED Final   Klebsiella pneumoniae NOT DETECTED NOT DETECTED Final   Proteus species NOT DETECTED NOT DETECTED Final   Salmonella species NOT DETECTED NOT DETECTED Final   Serratia marcescens NOT DETECTED NOT DETECTED Final   Haemophilus influenzae NOT DETECTED NOT DETECTED Final   Neisseria meningitidis NOT DETECTED NOT DETECTED Final   Pseudomonas aeruginosa NOT DETECTED NOT DETECTED Final   Stenotrophomonas maltophilia NOT DETECTED NOT DETECTED Final   Candida albicans NOT DETECTED NOT DETECTED Final   Candida auris NOT DETECTED NOT DETECTED Final   Candida glabrata NOT DETECTED NOT DETECTED Final   Candida krusei NOT DETECTED NOT DETECTED Final   Candida parapsilosis NOT DETECTED NOT DETECTED Final   Candida tropicalis NOT DETECTED NOT DETECTED Final   Cryptococcus neoformans/gattii NOT DETECTED NOT DETECTED Final    Comment: Performed at [facility], [address] [place] [address], [place],  [address]    Alcide Evener, [place] [facility] [phone] pager  01/06/2022, 4:53 PM

## 2022-01-06 NOTE — TOC Progression Note (Addendum)
Transition of Care [facility]) - Progression Note    Patient Details  Name: Karen Casey MRN: [id number] Date of Birth: 12/03/1955  Transition of Care [facility]) CM/SW Contact  Gerene Nedd, Juliann Pulse, RN Phone Number: 01/06/2022, 1:58 PM  Clinical Narrative:Has bed offers will await choice & pasrr.   -2:42p-received pasrr  [id number] A, added to fl2.    Expected Discharge Plan: [employer] Barriers to Discharge: Continued Medical Work up  Expected Discharge Plan and Services Expected Discharge Plan: [place]   Discharge Planning Services: CM Consult Post Acute Care Choice: [place] arrangements for the past 2 months: Single Family Home                                       Social Determinants of Health (SDOH) Interventions    Readmission Risk Interventions    01/03/2022   11:30 AM 12/01/2021    3:19 PM 11/23/2021    2:58 PM  Readmission Risk Prevention Plan  Transportation Screening Complete Complete   Medication Review Press photographer) Complete Complete Complete  PCP or Specialist appointment within 3-5 days of discharge Complete Complete   HRI or Home Care Consult Complete Complete Complete  SW Recovery Care/Counseling Consult Complete Complete Complete  Palliative Care Screening Not Applicable Not Applicable Not [place] Not Applicable Patient Refused Not Complete  SNF Comments   Patient's orientation is fluctuating and patient was in mittens in last 24 hours.

## 2022-01-06 NOTE — NC FL2 (Signed)
[place] LEVEL OF CARE SCREENING TOOL     IDENTIFICATION  Patient Name: Karen Casey Birthdate: 10/27/1955 Sex: female Admission Date (Current Location): 01/02/2022  [facility] and [place] Number:  Herbalist and Address:  [facility],  [place], [place]      Provider Number: [id number]  Attending Physician Name and Address:  Modena Jansky, MD  Relative Name and Phone Number:   Elenore Rota Wilkes(s/o) [phone])    Current Level of Care: Hospital Recommended Level of Care: [name] Prior Approval Number:    Date Approved/Denied:   PASRR Number:    Discharge Plan: SNF    Current Diagnoses: Patient Active Problem List   Diagnosis Date Noted   Dysphagia 01/06/2022   Ambulatory dysfunction 01/06/2022   Sepsis ([place]) 01/02/2022   Encephalopathy acute    Acute respiratory failure with hypoxia ([place])    Aspiration pneumonia ([place]) 11/22/2021   Right rib fracture 11/20/2021   Elevated LFTs 11/20/2021   Osteoporosis 09/23/2021   Failure to thrive in adult 09/23/2021   Protein-calorie malnutrition, severe 07/28/2021   Vitamin D deficiency 07/03/2021   Peripheral neuropathy    Seborrheic keratoses 05/29/2021   Hepatic steatosis 01/05/2021   Hypokalemia 01/02/2021   COPD (chronic obstructive pulmonary disease) ([place]) 12/09/2020   Chronic pain of breast 11/12/2020   Compression fracture of body of thoracic vertebra ([place]) 11/06/2020   SIADH (syndrome of inappropriate ADH production) ([place]) 10/12/2020   Protein-calorie malnutrition ([place]) 09/17/2020   Paroxysmal atrial fibrillation (HCC)    Shortness of breath    Alcohol withdrawal ([place]) 09/10/2020   Pulmonary nodules/lesions, multiple 07/14/2020   HTN (hypertension) 06/02/2018   Left foot pain 08/23/2017   Body mass index (BMI) 31.0-31.9, adult 03/29/2013   Community acquired pneumonia 03/14/2013   Anxiety disorder 06/12/2012   Alcohol use 05/28/2012    Benzodiazepine withdrawal without complication ([place]) 11/94/1740   Tobacco abuse 02/17/2012   GERD (gastroesophageal reflux disease) 02/04/2012   Nausea vomiting and diarrhea 02/04/2012   Depression 02/04/2012    Orientation RESPIRATION BLADDER Height & Weight     Self, Time, Situation, Place  Normal Incontinent Weight: 59.8 kg Height:  '5\' 3"'$  (160 cm)  BEHAVIORAL SYMPTOMS/MOOD NEUROLOGICAL BOWEL NUTRITION STATUS      Continent Diet (Heart Healthy)  AMBULATORY STATUS COMMUNICATION OF NEEDS Skin   Limited Assist Verbally Normal                       Personal Care Assistance Level of Assistance  Bathing, Feeding, Dressing Bathing Assistance: Limited assistance Feeding assistance: Limited assistance Dressing Assistance: Limited assistance     Functional Limitations Info  Sight, Hearing, Speech Sight Info: Adequate Hearing Info: Adequate Speech Info: Adequate    SPECIAL CARE FACTORS FREQUENCY  PT (By licensed PT), OT (By licensed OT)     PT Frequency:  (5x week) OT Frequency:  (5x week)            Contractures Contractures Info: Not present    Additional Factors Info  Code Status, Allergies, Psychotropic Code Status Info:  (Full) Allergies Info:  (Capsaicin-menthol, Diclo Gel (Diclofenac Sodium) Psychotropic Info:  (Buspar)         Current Medications (01/06/2022):  This is the current hospital active medication list Current Facility-Administered Medications  Medication Dose Route Frequency Provider Last Rate Last Admin   acetaminophen (TYLENOL) tablet 650 mg  650 mg Oral Q6H PRN Jonnie Finner,  DO   650 mg at 01/03/22 2010   Or   acetaminophen (TYLENOL) suppository 650 mg  650 mg Rectal Q6H PRN Marylyn Ishihara, Tyrone A, DO       albuterol (PROVENTIL) (2.5 MG/3ML) 0.083% nebulizer solution 2.5 mg  2.5 mg Inhalation Q6H PRN Marylyn Ishihara, Tyrone A, DO       alum & mag hydroxide-simeth (MAALOX/MYLANTA) 200-200-20 MG/5ML suspension 15 mL  15 mL Oral Q6H PRN Marylyn Ishihara, Tyrone A, DO    15 mL at 01/04/22 2051   Ampicillin-Sulbactam (UNASYN) 3 g in sodium chloride 0.9 % 100 mL IVPB  3 g Intravenous Q6H Tommy Medal, Lavell Islam, MD       busPIRone (BUSPAR) tablet 10 mg  10 mg Oral Q1500 Marylyn Ishihara, Tyrone A, DO   10 mg at 01/05/22 1609   enoxaparin (LOVENOX) injection 40 mg  40 mg Subcutaneous Q24H Kyle, Tyrone A, DO   40 mg at 01/05/22 1707   feeding supplement (ENSURE ENLIVE / ENSURE PLUS) liquid 237 mL  237 mL Oral Daily Kyle, Tyrone A, DO   237 mL at 01/06/22 0830   fluticasone furoate-vilanterol (BREO ELLIPTA) 100-25 MCG/ACT 1 puff  1 puff Inhalation Daily Kyle, Tyrone A, DO   1 puff at 01/06/22 6295   And   umeclidinium bromide (INCRUSE ELLIPTA) 62.5 MCG/ACT 1 puff  1 puff Inhalation Daily Kyle, Tyrone A, DO   1 puff at 28/41/32 4401   folic acid (FOLVITE) tablet 1 mg  1 mg Oral Daily Kyle, Tyrone A, DO   1 mg at 01/06/22 0830   gabapentin (NEURONTIN) capsule 600 mg  600 mg Oral TID Cherylann Ratel A, DO   600 mg at 01/06/22 0830   liver oil-zinc oxide (DESITIN) 40 % ointment   Topical Daily Marylyn Ishihara, Tyrone A, DO   1 Application at 02/72/53 0830   loperamide (IMODIUM) capsule 4 mg  4 mg Oral TID PRN Bonnielee Haff, MD       multivitamin with minerals tablet 1 tablet  1 tablet Oral Daily Kyle, Tyrone A, DO   1 tablet at 01/06/22 0829   nortriptyline (PAMELOR) capsule 75 mg  75 mg Oral QHS Kyle, Tyrone A, DO   75 mg at 01/05/22 2239   oxyCODONE (Oxy IR/ROXICODONE) immediate release tablet 5 mg  5 mg Oral Q4H PRN Marylyn Ishihara, Tyrone A, DO   5 mg at 01/06/22 0830   pantoprazole (PROTONIX) EC tablet 40 mg  40 mg Oral q morning Bonnielee Haff, MD   40 mg at 01/06/22 0830   polyvinyl alcohol (LIQUIFILM TEARS) 1.4 % ophthalmic solution 1 drop  1 drop Both Eyes PRN Bonnielee Haff, MD   1 drop at 01/04/22 1422   prochlorperazine (COMPAZINE) injection 10 mg  10 mg Intravenous Q6H PRN Marylyn Ishihara, Tyrone A, DO   10 mg at 01/05/22 6644   saccharomyces boulardii (FLORASTOR) capsule 250 mg  250 mg Oral BID Bonnielee Haff, MD   250 mg at 01/06/22 0830   sodium chloride flush (NS) 0.9 % injection 10-40 mL  10-40 mL Intracatheter PRN Bonnielee Haff, MD         Discharge Medications: Please see discharge summary for a list of discharge medications.  Relevant Imaging Results:  Relevant Lab Results:   Additional Information SSN: [id number]  Dessa Phi, RN

## 2022-01-06 NOTE — Progress Notes (Signed)
  Echocardiogram 2D Echocardiogram has been performed.  Karen Casey 01/06/2022, 3:54 PM

## 2022-01-06 NOTE — Evaluation (Signed)
Clinical/Bedside Swallow Evaluation Patient Details  Name: Karen Casey MRN: [id number] Date of Birth: 06-25-1955  Today's Date: 01/06/2022 Time: SLP Start Time (ACUTE ONLY): 8185 SLP Stop Time (ACUTE ONLY): 6314 SLP Time Calculation (min) (ACUTE ONLY): 20 min  Past Medical History:  Past Medical History:  Diagnosis Date   Alcohol abuse    last use 03/09/21, marijuana last 03/09/21   Allergy    Anxiety    Cataract 06/09/2012   Right eye and left eye   COPD (chronic obstructive pulmonary disease) ([place])    Depression    Drug withdrawal seizure with complication ([place]) 9/70/2637   Due to benzodiazepine withdrawal   Dyspnea    uses oxygen 2L via [place] prn   GERD (gastroesophageal reflux disease)    Headache    Hypertension    Long term (current) use of anticoagulants    Neuromuscular disorder ([place])    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   Seizure ([place])    08/2020 per patient   Seizures ([place])    xanax withdrawl- December 2013   Urinary incontinence 06/09/2012   Past Surgical History:  Past Surgical History:  Procedure Laterality Date   APPENDECTOMY     BIOPSY  01/04/2021   Procedure: BIOPSY;  Surgeon: Yetta Flock, MD;  Location: [place];  Service: Gastroenterology;;   BIOPSY  03/12/2021   Procedure: BIOPSY;  Surgeon: Irving Copas., MD;  Location: [place];  Service: Gastroenterology;;   CATARACT EXTRACTION  06/09/2012   Left eye   COLONOSCOPY WITH PROPOFOL N/A 03/12/2021   Procedure: COLONOSCOPY WITH PROPOFOL;  Surgeon: Irving Copas., MD;  Location: [place];  Service: Gastroenterology;  Laterality: N/A;   ESOPHAGOGASTRODUODENOSCOPY (EGD) WITH PROPOFOL N/A 01/04/2021   Procedure: ESOPHAGOGASTRODUODENOSCOPY (EGD) WITH PROPOFOL;  Surgeon: Yetta Flock, MD;  Location: [place];  Service: Gastroenterology;  Laterality: N/A;   left shoulder dislocation  Sept 2011   POLYPECTOMY  03/12/2021   Procedure:  POLYPECTOMY;  Surgeon: Mansouraty, Telford Nab., MD;  Location: [facility] ENDOSCOPY;  Service: Gastroenterology;;   HPI:  Patient is a 66 y.o. female with PMH: GERD, ETOH abuse, tobacco abuse, COPD, anxiety/depression. She was recently admitted last month after being found down at home and seen by speech due to h/o dysphagia. She had an MBS during previous admission on 09/16/2020 which showed laryngeal penetration of thin, but strong swallow reflex and no aspiration. Patient was admitted to the hospital on 01/02/22 with 3 day h/o diarrhea, nausea and vomiting but no relief when taking reflux medications (Nexium and Imodium). Patient decided to come to the ED when her symptoms didnt improve. C. difficile panel and GI panel were negative.  Diarrhea improved or even resolved. CT chest did not show any active disease.    Assessment / Plan / Recommendation  Clinical Impression  Patient presenting with clinical s/s of dysphagia as per this bedside/clinical swallow evaluation and suspect combination of pharyngeal and esophageal phase dysphagia as per her history. Initially, she did not exhibit any overt s/s of aspiration or penetration with sips of thin liquids (water) but she did then exhibit a delayed coughing spell that persisted and patient telling SLP she felt a tickle on her throat. She recovered from coughing spell and did not exhibit any changes in her vocal quality. Patient's MBS from April of 2022 showed penetration of thin liquids but no aspiration. Patient does have h/o GERD. SLP recommending to continue with current PO diet at this time and  plan for f/u next date to determine if objective swallow study (MBS) would be warranted. SLP Visit Diagnosis: Dysphagia, unspecified (R13.10)    Aspiration Risk  Mild aspiration risk    Diet Recommendation Regular;Thin liquid;Dysphagia 3 (Mech soft)   Liquid Administration via: Cup;Straw Medication Administration: Whole meds with liquid Supervision: Patient able to  self feed Compensations: Minimize environmental distractions;Slow rate;Small sips/bites Postural Changes: Seated upright at 90 degrees;Remain upright for at least 30 minutes after po intake    Other  Recommendations Oral Care Recommendations: Oral care BID    Recommendations for follow up therapy are one component of a multi-disciplinary discharge planning process, led by the attending physician.  Recommendations may be updated based on patient status, additional functional criteria and insurance authorization.  Follow up Recommendations No SLP follow up      Assistance Recommended at Discharge Frequent or constant Supervision/Assistance  Functional Status Assessment Patient has had a recent decline in their functional status and demonstrates the ability to make significant improvements in function in a reasonable and predictable amount of time.  Frequency and Duration min 1 x/week  1 week       Prognosis Prognosis for Safe Diet Advancement: Good Barriers to Reach Goals: Cognitive deficits      Swallow Study   General Date of Onset: 01/06/22 HPI: Patient is a 66 y.o. female with PMH: GERD, ETOH abuse, tobacco abuse, COPD, anxiety/depression. She was recently admitted last month after being found down at home and seen by speech due to h/o dysphagia. She had an MBS during previous admission on 09/16/2020 which showed laryngeal penetration of thin, but strong swallow reflex and no aspiration. Patient was admitted to the hospital on 01/02/22 with 3 day h/o diarrhea, nausea and vomiting but no relief when taking reflux medications (Nexium and Imodium). Patient decided to come to the ED when her symptoms didnt improve. C. difficile panel and GI panel were negative.  Diarrhea improved or even resolved. CT chest did not show any active disease. Type of Study: Bedside Swallow Evaluation Previous Swallow Assessment: during previous admissions (June 2023 and April 2022) Diet Prior to this Study:  Regular;Thin liquids Temperature Spikes Noted: No Respiratory Status: Room air History of Recent Intubation: No Behavior/Cognition: Alert;Cooperative;Pleasant mood Oral Cavity Assessment: Within Functional Limits Oral Care Completed by SLP: No Oral Cavity - Dentition: Poor condition Vision: Functional for self-feeding Self-Feeding Abilities: Able to feed self Patient Positioning: Partially reclined Baseline Vocal Quality: Normal Volitional Cough: Strong Volitional Swallow: Able to elicit    Oral/Motor/Sensory Function Overall Oral Motor/Sensory Function: Within functional limits   Ice Chips     Thin Liquid Thin Liquid: Impaired Presentation: Straw;Self Fed Pharyngeal  Phase Impairments: Cough - Delayed;Throat Clearing - Delayed    Nectar Thick     Honey Thick     Puree Puree: Not tested   Solid     Solid: Not tested      Sonia Baller, MA, CCC-SLP Speech Therapy

## 2022-01-06 NOTE — Patient Outreach (Signed)
  Care Coordination   Follow Up Visit Note   01/06/2022 Name: Karen Casey MRN: [id number] DOB: [birth year]  Karen Casey is a 66 y.o. year old female who sees Pray, Norwood Levo, MD for primary care. Patient is still admitted in the hospital and prefers not to speak at this time.  Follow up plan: Follow up call scheduled for Within the next 60 days.  Encounter Outcome:  Pt. Visit Completed  Milus Height, BSW,MSW  Social Worker [facility] Care Management  [phone]

## 2022-01-06 NOTE — Progress Notes (Signed)
Patient refuses IV placement.

## 2022-01-06 NOTE — Progress Notes (Signed)
Patient's IV became very painful. Discontinued IV and patient refused having a new IV placed by IV team. Unable to get IV abx completed. Notified J. Olena Heckle, NP

## 2022-01-06 NOTE — Evaluation (Signed)
Occupational Therapy Evaluation Patient Details Name: Karen Casey MRN: [id number] DOB: 1956-03-24 Today's Date: 01/06/2022   History of Present Illness 66 y.o. female presenting to [facility] ED 7/22 with abdominal pain and N/V/D x several days. Work-up (+) enterocolitis and bacteremia. Previous admission 6/10-6/20 with fall and R rib fx. Admitted 2/13-2/15 with D/V/D.  PMHx significant for GERD, Hx of UTI, PAF, ETOH use, tobacco use, COPD and anxiety/depression.   Clinical Impression   PTA patient was living with her spouse in a private residence and was grossly independent with ADLs/IADLs without AD prior to onset of illness several months ago. Reports requiring external assist with ADLs and inability to ambulate x several weeks prior to admission. Patient currently requiring Min-Mod A for sit to stand transfers and requires Min A for stand-pivot transfers with RW. Cues for safety and walker management. Patient also limited by deficits listed below including generalized weakness/debility and decreased dynamic standing balance and would benefit from continued acute OT services in prep for safe d/c to next level of care. Patient with request to d/c to short-term rehab at a SNF. Will continue to follow acutely to maximize safety/independence with self-care tasks.        Recommendations for follow up therapy are one component of a multi-disciplinary discharge planning process, led by the attending physician.  Recommendations may be updated based on patient status, additional functional criteria and insurance authorization.   Follow Up Recommendations  Skilled nursing-short term rehab (<3 hours/day)    Assistance Recommended at Discharge Frequent or constant Supervision/Assistance  Patient can return home with the following A lot of help with walking and/or transfers;A lot of help with bathing/dressing/bathroom    Functional Status Assessment  Patient has had a recent decline in their functional  status and demonstrates the ability to make significant improvements in function in a reasonable and predictable amount of time.  Equipment Recommendations  Other (comment) (Defer to next level of care)    Recommendations for Other Services       Precautions / Restrictions Precautions Precautions: Fall      Mobility Bed Mobility Overal bed mobility: Modified Independent                  Transfers Overall transfer level: Needs assistance Equipment used: Rolling walker (2 wheels) Transfers: Sit to/from Stand Sit to Stand: Mod assist Stand pivot transfers: Min assist         General transfer comment: Min-Mod A for sit to stand from EOB with cues for hand placement. Demonstrates self-limiting behavior. Min A for stand-pivot to recliner with cues for walker management and safe hand placement.      Balance Overall balance assessment: Needs assistance Sitting-balance support: Feet supported, No upper extremity supported Sitting balance-Leahy Scale: Good     Standing balance support: Reliant on assistive device for balance Standing balance-Leahy Scale: Fair Standing balance comment: Intentionally buckles BLE but recovers with without external assist.                           ADL either performed or assessed with clinical judgement   ADL Overall ADL's : Needs assistance/impaired     Grooming: Set up;Sitting Grooming Details (indicate cue type and reason): Refused attempts to complete grooming standing at sink level.         Upper Body Dressing : Set up;Sitting   Lower Body Dressing: Set up;Bed level Lower Body Dressing Details (indicate cue type and reason): Donned  bilateral footwear at bedlevel with set-up A. Toilet Transfer: Minimal assistance;Rolling walker (2 wheels) Toilet Transfer Details (indicate cue type and reason): Simulated with stand-pivot to recliner with use of RW. Cues for walker management. Patient self-limiting. Requires cues for  safety.                 Vision Baseline Vision/History: 0 No visual deficits Ability to See in Adequate Light: 0 Adequate Patient Visual Report: No change from baseline Vision Assessment?: No apparent visual deficits     Perception     Praxis      Pertinent Vitals/Pain Pain Assessment Pain Assessment: No/denies pain Pain Intervention(s): Monitored during session     Hand Dominance Right   Extremity/Trunk Assessment Upper Extremity Assessment Upper Extremity Assessment: Generalized weakness   Lower Extremity Assessment Lower Extremity Assessment: Generalized weakness   Cervical / Trunk Assessment Cervical / Trunk Assessment: Normal   Communication Communication Communication: No difficulties   Cognition Arousal/Alertness: Awake/alert Behavior During Therapy: Anxious Overall Cognitive Status: Within Functional Limits for tasks assessed                                 General Comments: Limited by high anxiety; Hx of anxiety/depression. Demonstrates self-limiting behavior.     General Comments  Patient states "all they do is put me in this bed and leave me here". Upon tranfser to recliner patient immediately requesting to return to supine. Education on importance of OOB activity to progress toward goals. Patient expressed verbal understanding.    Exercises     Shoulder Instructions      Home Living Family/patient expects to be discharged to:: Private residence Living Arrangements: Spouse/significant other Available Help at Discharge: Family;Available 24 hours/day Type of Home: House Home Access: Ramped entrance     Home Layout: One level     Bathroom Shower/Tub: Teacher, early years/pre: Handicapped height Bathroom Accessibility: Yes How Accessible: Accessible via walker Home Equipment: Rolling Walker (2 wheels);BSC/3in1;Wheelchair - manual          Prior Functioning/Environment Prior Level of Function : Needs assist        Physical Assist : Mobility (physical);ADLs (physical)     Mobility Comments: Patient reports being unable to get out of bed x several weeks 2/2 weakness. Per patient report husband assists with getting on/off bedpan. ADLs Comments: Reports assist from spouse with ADLs x several weeks. Unable to bathe at shower level as she cannot get into the tub/shower.        OT Problem List: Decreased strength;Decreased activity tolerance;Impaired balance (sitting and/or standing);Decreased safety awareness;Decreased knowledge of use of DME or AE      OT Treatment/Interventions: Self-care/ADL training;Therapeutic exercise;Energy conservation;DME and/or AE instruction;Therapeutic activities;Patient/family education;Balance training    OT Goals(Current goals can be found in the care plan section) Acute Rehab OT Goals Patient Stated Goal: To go to SNF rehab OT Goal Formulation: With patient Time For Goal Achievement: 01/20/22 Potential to Achieve Goals: Good ADL Goals Pt Will Perform Grooming: with modified independence;standing Pt Will Perform Lower Body Dressing: with modified independence;with adaptive equipment;sit to/from stand Pt Will Transfer to Toilet: with modified independence;ambulating Pt Will Perform Toileting - Clothing Manipulation and hygiene: with modified independence;sit to/from stand Pt/caregiver will Perform Home Exercise Program: Increased strength;Both right and left upper extremity;Independently  OT Frequency: Min 2X/week    Co-evaluation  AM-PAC OT "6 Clicks" Daily Activity     Outcome Measure Help from another person eating meals?: None Help from another person taking care of personal grooming?: A Little Help from another person toileting, which includes using toliet, bedpan, or urinal?: A Little Help from another person bathing (including washing, rinsing, drying)?: A Lot Help from another person to put on and taking off regular upper body clothing?:  A Little Help from another person to put on and taking off regular lower body clothing?: A Lot 6 Click Score: 17   End of Session Equipment Utilized During Treatment: Gait belt;Rolling walker (2 wheels) Nurse Communication: Mobility status  Activity Tolerance: Other (comment) (Limited by poor participation) Patient left: in bed;with call bell/phone within reach;with bed alarm set  OT Visit Diagnosis: Unsteadiness on feet (R26.81);Muscle weakness (generalized) (M62.81)                Time: 1884-1660 OT Time Calculation (min): 28 min Charges:  OT General Charges $OT Visit: 1 Visit OT Evaluation $OT Eval Low Complexity: 1 Low OT Treatments $Therapeutic Activity: 8-22 mins  Jordanne Elsbury H. OTR/L Supplemental OT, Department of rehab services [phone]  Reene Harlacher R H. 01/06/2022, 12:05 PM

## 2022-01-06 NOTE — Progress Notes (Addendum)
Patient refused getting her labs drawn. Multiple people tried to educate and convince the need to get labs. Notified J. Olena Heckle, NP.

## 2022-01-06 NOTE — Evaluation (Signed)
Physical Therapy Evaluation Patient Details Name: Karen Casey MRN: [id number] DOB: [birth year] Today's Date: 01/06/2022  History of Present Illness  Pt is a  66 y.o. female presenting with 3-day history of diarrhea.  CT scan suggested enterocolitis.  Pt admitted 01/02/22 for Acute enterocolitis/severe diarrhea/sepsis present at admission . Past medical history significant of GERD, PAF, EtOH abuse, tobacco abuse, COPD, anxiety/depression.  Clinical Impression  Pt admitted with above diagnosis.  Pt currently with functional limitations due to the deficits listed below (see PT Problem List). Pt will benefit from skilled PT to increase their independence and safety with mobility to allow discharge to the venue listed below.   Pt easily able to move around in bed however reports difficulty with transfers and standing for weeks leading up to admission.  Pt states she would like to be able to ambulate again however feels like this isn't a possibly right now and fearful of declining mobility at home.  Pt is agreeable to SNF for rehab at this time.        Recommendations for follow up therapy are one component of a multi-disciplinary discharge planning process, led by the attending physician.  Recommendations may be updated based on patient status, additional functional criteria and insurance authorization.  Follow Up Recommendations Skilled nursing-short term rehab (<3 hours/day) Can patient physically be transported by private vehicle: No    Assistance Recommended at Discharge    Patient can return home with the following  A lot of help with walking and/or transfers;A lot of help with bathing/dressing/bathroom;Assist for transportation;Assistance with cooking/housework;Help with stairs or ramp for entrance    Equipment Recommendations None recommended by PT  Recommendations for Other Services       Functional Status Assessment Patient has had a recent decline in their functional status and  demonstrates the ability to make significant improvements in function in a reasonable and predictable amount of time.     Precautions / Restrictions Precautions Precautions: Fall      Mobility  Bed Mobility Overal bed mobility: Modified Independent Bed Mobility: Rolling, Supine to Sit, Sit to Supine Rolling: Modified independent (Device/Increase time)   Supine to sit: Modified independent (Device/Increase time) Sit to supine: Modified independent (Device/Increase time)   General bed mobility comments: pt able to roll in bed, sit EOB, and return to supine without any assist    Transfers Overall transfer level: Needs assistance   Transfers: Bed to chair/wheelchair/BSC            Lateral/Scoot Transfers: Max assist, Total assist General transfer comment: pt set up for drop arm recliner lateral scoot transfer however pt feels unable to perform scooting and declined further assistance into recliner at this time; does report fear of falling or hurting therapist with attempt to assist    Ambulation/Gait                  Stairs            Wheelchair Mobility    Modified Rankin (Stroke Patients Only)       Balance Overall balance assessment: Needs assistance Sitting-balance support: Feet supported, No upper extremity supported Sitting balance-Leahy Scale: Good         Standing balance comment: unable to stand, fearful of falling                             Pertinent Vitals/Pain Pain Assessment Pain Assessment: No/denies pain Pain Intervention(s): Monitored during session,  Repositioned    Home Living Family/patient expects to be discharged to:: Private residence Living Arrangements: Spouse/significant other Available Help at Discharge: Family;Available 24 hours/day Type of Home: House Home Access: Ramped entrance       Home Layout: One level Home Equipment: Conservation officer, nature (2 wheels);BSC/3in1;Wheelchair - manual      Prior  Function Prior Level of Function : Needs assist             Mobility Comments: Pt states has not been ambulating at home, too weak to get OOB       Hand Dominance        Extremity/Trunk Assessment        Lower Extremity Assessment Lower Extremity Assessment: Generalized weakness    Cervical / Trunk Assessment Cervical / Trunk Assessment: Normal  Communication   Communication: No difficulties  Cognition Arousal/Alertness: Awake/alert Behavior During Therapy: Anxious Overall Cognitive Status: Within Functional Limits for tasks assessed                                          General Comments      Exercises     Assessment/Plan    PT Assessment Patient needs continued PT services  PT Problem List Decreased mobility;Decreased activity tolerance;Decreased strength;Decreased balance;Decreased knowledge of use of DME       PT Treatment Interventions Gait training;DME instruction;Therapeutic exercise;Functional mobility training;Therapeutic activities;Patient/family education;Balance training    PT Goals (Current goals can be found in the Care Plan section)  Acute Rehab PT Goals PT Goal Formulation: With patient Time For Goal Achievement: 01/20/22 Potential to Achieve Goals: Good    Frequency Min 2X/week     Co-evaluation               AM-PAC PT "6 Clicks" Mobility  Outcome Measure Help needed turning from your back to your side while in a flat bed without using bedrails?: None Help needed moving from lying on your back to sitting on the side of a flat bed without using bedrails?: None Help needed moving to and from a bed to a chair (including a wheelchair)?: A Lot Help needed standing up from a chair using your arms (e.g., wheelchair or bedside chair)?: Total Help needed to walk in hospital room?: Total Help needed climbing 3-5 steps with a railing? : Total 6 Click Score: 13    End of Session   Activity Tolerance: Patient  tolerated treatment well Patient left: in bed;with call bell/phone within reach (with lab (attempt to get blood draw))   PT Visit Diagnosis: Muscle weakness (generalized) (M62.81);Other abnormalities of gait and mobility (R26.89)    Time: 0086-7619 PT Time Calculation (min) (ACUTE ONLY): 32 min   Charges:   PT Evaluation $PT Eval Low Complexity: 1 Low PT Treatments $Therapeutic Activity: 8-22 mins      Jannette Spanner PT, DPT Physical Therapist Acute Rehabilitation Services Preferred contact method: Secure Chat Weekend Pager Only: [phone] Office: [place] 01/06/2022, 11:44 AM

## 2022-01-07 ENCOUNTER — Telehealth: Payer: Self-pay | Admitting: Pharmacy Technician

## 2022-01-07 ENCOUNTER — Other Ambulatory Visit (HOSPITAL_COMMUNITY): Payer: Self-pay

## 2022-01-07 DIAGNOSIS — R7881 Bacteremia: Secondary | ICD-10-CM

## 2022-01-07 DIAGNOSIS — B952 Enterococcus as the cause of diseases classified elsewhere: Secondary | ICD-10-CM

## 2022-01-07 DIAGNOSIS — K529 Noninfective gastroenteritis and colitis, unspecified: Principal | ICD-10-CM

## 2022-01-07 LAB — CULTURE, BLOOD (ROUTINE X 2)
Culture: NO GROWTH
Special Requests: ADEQUATE
Special Requests: ADEQUATE

## 2022-01-07 LAB — BASIC METABOLIC PANEL
Anion gap: 6 (ref 5–15)
BUN: <5 mg/dL — ABNORMAL LOW (ref 8–23)
CO2: 21 mmol/L — ABNORMAL LOW (ref 22–32)
Calcium: 7.9 mg/dL — ABNORMAL LOW (ref 8.9–10.3)
Chloride: 107 mmol/L (ref 98–111)
Creatinine, Ser: 0.61 mg/dL (ref 0.44–1.00)
GFR, Estimated: >60 mL/min (ref >60)
Glucose, Bld: 89 mg/dL (ref 70–99)
Potassium: 3.8 mmol/L (ref 3.5–5.1)
Sodium: 134 mmol/L — ABNORMAL LOW (ref 135–145)

## 2022-01-07 MED ORDER — LINEZOLID 600 MG PO TABS: 600.0000 mg | ORAL_TABLET | Freq: Two times a day (BID) | ORAL | 0 refills | Status: AC

## 2022-01-07 MED ORDER — NORTRIPTYLINE HCL 25 MG PO CAPS: 75.0000 mg | ORAL_CAPSULE | Freq: Every day | ORAL | Status: DC

## 2022-01-07 MED ORDER — FLUCONAZOLE 100 MG PO TABS
200.0000 mg | ORAL_TABLET | Freq: Every day | ORAL | Status: DC
  Administered 2022-01-07: 200 mg via ORAL
  Filled 2022-01-07: qty 2

## 2022-01-07 MED ORDER — FLUCONAZOLE 200 MG PO TABS: 200.0000 mg | ORAL_TABLET | Freq: Every day | ORAL | 0 refills | Status: AC

## 2022-01-07 MED ORDER — LINEZOLID 600 MG PO TABS: 600.0000 mg | ORAL_TABLET | Freq: Two times a day (BID) | ORAL | Status: DC

## 2022-01-07 MED ORDER — FLUCONAZOLE 200 MG PO TABS: 200.0000 mg | ORAL_TABLET | Freq: Every day | ORAL | Status: DC

## 2022-01-07 MED ORDER — ACETAMINOPHEN ER 650 MG PO TBCR: 650.0000 mg | EXTENDED_RELEASE_TABLET | Freq: Four times a day (QID) | ORAL | Status: DC | PRN

## 2022-01-07 MED ORDER — ENSURE ENLIVE PO LIQD: 237.0000 mL | Freq: Every day | ORAL | Status: DC

## 2022-01-07 NOTE — Telephone Encounter (Signed)
Pharmacy Patient Advocate Encounter  Insurance verification completed.    The patient is insured through [employer]   The patient is currently admitted and ran test claims for the following: Linezolid.  Copays and coinsurance results were relayed to Inpatient clinical team.

## 2022-01-07 NOTE — Discharge Instructions (Signed)

## 2022-01-07 NOTE — TOC Benefit Eligibility Note (Signed)
Patient Research scientist (life sciences) completed.     The patient is currently admitted and upon discharge could be taking Linezolid.   Prior Authorization required  The patient is insured through [employer].

## 2022-01-07 NOTE — TOC Transition Note (Signed)
Transition of Care [facility]) - CM/SW Discharge Note   Patient Details  Name: Karen Casey MRN: [id number] Date of Birth: 1956/03/13  Transition of Care [facility]) CM/SW Contact:  Dessa Phi, RN Phone Number: 01/07/2022, 3:00 PM   Clinical Narrative-Admitting-   Confirmed patient only has medicare part A,does not have part B-linezolid-med would cost $488 per day. Patient can't afford @ SNF. Patient agree to d/c home w/[facility] H[facility](labs),HHPT/OT/aide/CSW(to asst w/medicare part B). Spoke to patietn's sister Hilda Blades informed her of d/c plans-she voiced understanding.Provided patient w/goodrx discount coupon for [employer]. PTAR called.    Final next level of care: [place] Barriers to Discharge: No Barriers Identified   Patient Goals and CMS Choice Patient states their goals for this hospitalization and ongoing recovery are:: SNF CMS Medicare.gov Compare Post Acute Care list provided to:: Patient Choice offered to / list presented to : Patient  Discharge Placement                       Discharge Plan and Services   Discharge Planning Services: CM Consult Post Acute Care Choice: Home Health                    HH Arranged: RN, PT, OT, Nurse's Aide, Social Work [employer] Agency: [place] Date [place]: 01/07/22 Time [place]: 1500 Representative spoke with at [place]: [place] ([place]) Interventions     Readmission Risk Interventions    01/03/2022   11:30 AM 12/01/2021    3:19 PM 11/23/2021    2:58 PM  Readmission Risk Prevention Plan  Transportation Screening Complete Complete   Medication Review Press photographer) Complete Complete Complete  PCP or Specialist appointment within 3-5 days of discharge Complete Complete   HRI or [place] Complete Complete Complete  SW Recovery Care/Counseling Consult Complete Complete Complete  Palliative Care Screening Not Applicable Not Applicable Not  [place] Not Applicable Patient Refused Not Complete  SNF Comments   Patient's orientation is fluctuating and patient was in mittens in last 24 hours.

## 2022-01-07 NOTE — TOC Transition Note (Addendum)
Transition of Care [facility]) - CM/SW Discharge Note   Patient Details  Name: Karen Casey MRN: [id number] Date of Birth: [birth year]  [facility] CM/SW Contact:  Dessa Phi, RN Phone Number: 01/07/2022, 1:11 PM   Clinical Narrative:  d/c today to [place] aware. Will await d/c summary,rm#,tel# report prior [facility]AR.  -1:24p-updated fl2 sent to [place] aware.    Final next level of care: Skilled Nursing Facility Barriers to Discharge: No Barriers Identified   Patient Goals and CMS Choice Patient states their goals for this hospitalization and ongoing recovery are:: SNF CMS Medicare.gov Compare Post Acute Care list provided to:: Patient Choice offered to / list presented to : Patient  Discharge Placement                       Discharge Plan and Services   Discharge Planning Services: CM Consult Post Acute Care Choice: Home Health                               Social Determinants of Health (SDOH) Interventions     Readmission Risk Interventions    01/03/2022   11:30 AM 12/01/2021    3:19 PM 11/23/2021    2:58 PM  Readmission Risk Prevention Plan  Transportation Screening Complete Complete   Medication Review Press photographer) Complete Complete Complete  PCP or Specialist appointment within 3-5 days of discharge Complete Complete   HRI or Home Care Consult Complete Complete Complete  SW Recovery Care/Counseling Consult Complete Complete Complete  Palliative Care Screening Not Applicable Not Applicable Not [place] Not Applicable Patient Refused Not Complete  SNF Comments   Patient's orientation is fluctuating and patient was in mittens in last 24 hours.

## 2022-01-07 NOTE — Progress Notes (Signed)
Subjective:  C/o dysphagia   Antibiotics:  Anti-infectives (From admission, onward)    Start     Dose/Rate Route Frequency Ordered Stop   01/07/22 1000  fluconazole (DIFLUCAN) tablet 200 mg        200 mg Oral Daily 01/07/22 0903     01/06/22 1700  linezolid (ZYVOX) tablet 600 mg        600 mg Oral Every 12 hours 01/06/22 1613     01/06/22 1415  amoxicillin-clavulanate (AUGMENTIN) 875-125 MG per tablet 1 tablet  Status:  Discontinued        1 tablet Oral Every 12 hours 01/06/22 1316 01/06/22 1325   01/06/22 1415  Ampicillin-Sulbactam (UNASYN) 3 g in sodium chloride 0.9 % 100 mL IVPB  Status:  Discontinued        3 g 200 mL/hr over 30 Minutes Intravenous Every 6 hours 01/06/22 1325 01/06/22 1617   01/05/22 0600  metroNIDAZOLE (FLAGYL) tablet 500 mg        500 mg Oral  Once 01/05/22 0502 01/05/22 0611   01/02/22 1400  cefTRIAXone (ROCEPHIN) 2 g in sodium chloride 0.9 % 100 mL IVPB  Status:  Discontinued        2 g 200 mL/hr over 30 Minutes Intravenous Every 24 hours 01/02/22 1350 01/06/22 1316   01/02/22 1400  metroNIDAZOLE (FLAGYL) IVPB 500 mg  Status:  Discontinued        500 mg 100 mL/hr over 60 Minutes Intravenous Every 12 hours 01/02/22 1350 01/06/22 1316       Medications: Scheduled Meds:  enoxaparin (LOVENOX) injection  40 mg Subcutaneous Q24H   feeding supplement  237 mL Oral Daily   fluconazole  200 mg Oral Daily   fluticasone furoate-vilanterol  1 puff Inhalation Daily   And   umeclidinium bromide  1 puff Inhalation Daily   folic acid  1 mg Oral Daily   gabapentin  600 mg Oral TID   linezolid  600 mg Oral Q12H   liver oil-zinc oxide   Topical Daily   multivitamin with minerals  1 tablet Oral Daily   nicotine  21 mg Transdermal Daily   nortriptyline  75 mg Oral QHS   pantoprazole  40 mg Oral q morning   Continuous Infusions: PRN Meds:.acetaminophen **OR** acetaminophen, albuterol, alum & mag hydroxide-simeth, loperamide, oxyCODONE, polyvinyl  alcohol, prochlorperazine, sodium chloride flush    Objective: Weight change:   Intake/Output Summary (Last 24 hours) at 01/07/2022 1348 Last data filed at 01/07/2022 0930 Gross per 24 hour  Intake 720 ml  Output 550 ml  Net 170 ml   Blood pressure 128/71, pulse 89, temperature 98.2 F (36.8 C), temperature source Oral, resp. rate 18, height '5\' 3"'$  (1.6 m), weight 59.8 kg, SpO2 100 %. Temp:  [97.4 F (36.3 C)-98.2 F (36.8 C)] 98.2 F (36.8 C) (07/27 1303) Pulse Rate:  [85-93] 89 (07/27 1303) Resp:  [16-18] 18 (07/27 1303) BP: (118-133)/(71-84) 128/71 (07/27 1303) SpO2:  [95 %-100 %] 100 % (07/27 1303)  Physical Exam: Physical Exam Constitutional:      General: She is not in acute distress.    Appearance: She is well-developed. She is obese. She is not diaphoretic.  HENT:     Head: Normocephalic and atraumatic.     Right Ear: External ear normal.     Left Ear: External ear normal.     Mouth/Throat:     Pharynx: Oropharyngeal exudate present.  Eyes:  General: No scleral icterus.    Extraocular Movements: Extraocular movements intact.     Conjunctiva/sclera: Conjunctivae normal.     Pupils: Pupils are equal, round, and reactive to light.  Cardiovascular:     Rate and Rhythm: Normal rate and regular rhythm.     Heart sounds:     No friction rub.  Pulmonary:     Effort: Pulmonary effort is normal. No respiratory distress.     Breath sounds: Normal breath sounds. No wheezing.  Abdominal:     General: Bowel sounds are normal. There is no distension.     Palpations: Abdomen is soft.  Musculoskeletal:        General: No tenderness. Normal range of motion.  Skin:    General: Skin is warm and dry.     Coloration: Skin is not pale.     Findings: No erythema or rash.  Neurological:     General: No focal deficit present.     Mental Status: She is alert and oriented to person, place, and time.     Motor: No abnormal muscle tone.     Coordination: Coordination normal.   Psychiatric:        Mood and Affect: Mood normal.        Behavior: Behavior normal.        Thought Content: Thought content normal.        Judgment: Judgment normal.      CBC:    BMET Recent Labs    01/06/22 0637 01/07/22 0529  NA 133* 134*  K 5.0 3.8  CL 110 107  CO2 18* 21*  GLUCOSE 77 89  BUN <5* <5*  CREATININE 0.56 0.61  CALCIUM 8.0* 7.9*     Liver Panel  No results for input(s): "PROT", "ALBUMIN", "AST", "ALT", "ALKPHOS", "BILITOT", "BILIDIR", "IBILI" in the last 72 hours.     Sedimentation Rate No results for input(s): "ESRSEDRATE" in the last 72 hours. C-Reactive Protein No results for input(s): "CRP" in the last 72 hours.  Micro Results: Recent Results (from the past 720 hour(s))  Urine Culture     Status: Abnormal   Collection Time: 12/11/21  4:44 PM   Specimen: Urine, Clean Catch  Result Value Ref Range Status   Specimen Description URINE, CLEAN CATCH  Final   Special Requests   Final    NONE Performed at [facility], [address], [place], [place] [address]    Culture (A)  Final    60,000 COLONIES/mL KLEBSIELLA PNEUMONIAE >=100,000 COLONIES/mL ENTEROCOCCUS FAECALIS    Report Status 12/13/2021 FINAL  Final   Organism ID, Bacteria KLEBSIELLA PNEUMONIAE (A)  Final   Organism ID, Bacteria ENTEROCOCCUS FAECALIS (A)  Final      Susceptibility   Enterococcus faecalis - MIC*    AMPICILLIN <=2 SENSITIVE Sensitive     NITROFURANTOIN <=16 SENSITIVE Sensitive     VANCOMYCIN 1 SENSITIVE Sensitive     * >=100,000 COLONIES/mL ENTEROCOCCUS FAECALIS   Klebsiella pneumoniae - MIC*    AMPICILLIN RESISTANT Resistant     CEFAZOLIN <=4 SENSITIVE Sensitive     CEFEPIME <=0.12 SENSITIVE Sensitive     CEFTRIAXONE <=0.25 SENSITIVE Sensitive     CIPROFLOXACIN <=0.25 SENSITIVE Sensitive     GENTAMICIN <=1 SENSITIVE Sensitive     IMIPENEM <=0.25 SENSITIVE Sensitive     NITROFURANTOIN <=16 SENSITIVE Sensitive     TRIMETH/SULFA <=20 SENSITIVE Sensitive      AMPICILLIN/SULBACTAM 4 SENSITIVE Sensitive     PIP/TAZO <=4 SENSITIVE  Sensitive     * 60,000 COLONIES/mL KLEBSIELLA PNEUMONIAE  Gastrointestinal Panel by PCR , Stool     Status: None   Collection Time: 01/02/22  1:12 PM   Specimen: STOOL  Result Value Ref Range Status   Campylobacter species NOT DETECTED NOT DETECTED Final   Plesimonas shigelloides NOT DETECTED NOT DETECTED Final   Salmonella species NOT DETECTED NOT DETECTED Final   Yersinia enterocolitica NOT DETECTED NOT DETECTED Final   Vibrio species NOT DETECTED NOT DETECTED Final   Vibrio cholerae NOT DETECTED NOT DETECTED Final   Enteroaggregative E coli (EAEC) NOT DETECTED NOT DETECTED Final   Enteropathogenic E coli (EPEC) NOT DETECTED NOT DETECTED Final   Enterotoxigenic E coli (ETEC) NOT DETECTED NOT DETECTED Final   Shiga like toxin producing E coli (STEC) NOT DETECTED NOT DETECTED Final   Shigella/Enteroinvasive E coli (EIEC) NOT DETECTED NOT DETECTED Final   Cryptosporidium NOT DETECTED NOT DETECTED Final   Cyclospora cayetanensis NOT DETECTED NOT DETECTED Final   Entamoeba histolytica NOT DETECTED NOT DETECTED Final   Giardia lamblia NOT DETECTED NOT DETECTED Final   Adenovirus F40/41 NOT DETECTED NOT DETECTED Final   Astrovirus NOT DETECTED NOT DETECTED Final   Norovirus GI/GII NOT DETECTED NOT DETECTED Final   Rotavirus A NOT DETECTED NOT DETECTED Final   Sapovirus (I, II, IV, and V) NOT DETECTED NOT DETECTED Final    Comment: Performed at [facility], [place] [place], [place] [address]  C Difficile Quick Screen w PCR reflex     Status: None   Collection Time: 01/02/22  1:34 PM   Specimen: STOOL  Result Value Ref Range Status   C Diff antigen NEGATIVE NEGATIVE Final   C Diff toxin NEGATIVE NEGATIVE Final   C Diff interpretation No C. difficile detected.  Final    Comment: Performed at [facility], [place] [address] [place], [place] [address]  Culture, blood (Routine X 2)  w Reflex to ID Panel     Status: Abnormal   Collection Time: 01/02/22  4:48 PM   Specimen: BLOOD RIGHT HAND  Result Value Ref Range Status   Specimen Description   Final    BLOOD RIGHT HAND Performed at [facility], [place] [address], [place], [place] [address]    Special Requests   Final    IN PEDIATRIC BOTTLE Blood Culture adequate volume Performed at [place] [address] [place], [place] [address]    Culture  Setup Time   Final    GRAM POSITIVE COCCI IN CHAINS IN PEDIATRIC BOTTLE CRITICAL RESULT CALLED TO, READ BACK BY AND VERIFIED WITH: RN Alyson Locket [phone] '@[phone]'$  FH Performed at [facility], [place] [address], [place],  [address]    Culture ENTEROCOCCUS AVIUM (A)  Final   Report Status 01/07/2022 FINAL  Final   Organism ID, Bacteria ENTEROCOCCUS AVIUM  Final      Susceptibility   Enterococcus avium - MIC*    AMPICILLIN <=2 SENSITIVE Sensitive     VANCOMYCIN <=0.5 SENSITIVE Sensitive     GENTAMICIN SYNERGY SENSITIVE Sensitive     * ENTEROCOCCUS AVIUM  Culture, blood (Routine X 2) w Reflex to ID Panel     Status: None   Collection Time: 01/02/22  4:48 PM   Specimen: BLOOD RIGHT HAND  Result Value Ref Range Status   Specimen Description   Final    BLOOD RIGHT HAND Performed at [facility], [place]  [address] [place], [place] [address]    Special Requests   Final    BOTTLES DRAWN AEROBIC ONLY Blood Culture adequate volume Performed at [place] [address], [place], [place] [address]    Culture   Final    NO GROWTH 5 DAYS Performed at [facility], [place] [address] [place], [place] [address]    Report Status 01/07/2022 FINAL  Final  Blood Culture ID Panel (Reflexed)     Status: None   Collection Time: 01/02/22  4:48 PM  Result Value Ref Range Status   Enterococcus faecalis NOT DETECTED NOT DETECTED Final   Enterococcus Faecium NOT DETECTED NOT DETECTED Final   Listeria monocytogenes NOT  DETECTED NOT DETECTED Final   Staphylococcus species NOT DETECTED NOT DETECTED Final   Staphylococcus aureus (BCID) NOT DETECTED NOT DETECTED Final   Staphylococcus epidermidis NOT DETECTED NOT DETECTED Final   Staphylococcus lugdunensis NOT DETECTED NOT DETECTED Final   Streptococcus species NOT DETECTED NOT DETECTED Final   Streptococcus agalactiae NOT DETECTED NOT DETECTED Final   Streptococcus pneumoniae NOT DETECTED NOT DETECTED Final   Streptococcus pyogenes NOT DETECTED NOT DETECTED Final   A.calcoaceticus-baumannii NOT DETECTED NOT DETECTED Final   Bacteroides fragilis NOT DETECTED NOT DETECTED Final   Enterobacterales NOT DETECTED NOT DETECTED Final   Enterobacter cloacae complex NOT DETECTED NOT DETECTED Final   Escherichia coli NOT DETECTED NOT DETECTED Final   Klebsiella aerogenes NOT DETECTED NOT DETECTED Final   Klebsiella oxytoca NOT DETECTED NOT DETECTED Final   Klebsiella pneumoniae NOT DETECTED NOT DETECTED Final   Proteus species NOT DETECTED NOT DETECTED Final   Salmonella species NOT DETECTED NOT DETECTED Final   Serratia marcescens NOT DETECTED NOT DETECTED Final   Haemophilus influenzae NOT DETECTED NOT DETECTED Final   Neisseria meningitidis NOT DETECTED NOT DETECTED Final   Pseudomonas aeruginosa NOT DETECTED NOT DETECTED Final   Stenotrophomonas maltophilia NOT DETECTED NOT DETECTED Final   Candida albicans NOT DETECTED NOT DETECTED Final   Candida auris NOT DETECTED NOT DETECTED Final   Candida glabrata NOT DETECTED NOT DETECTED Final   Candida krusei NOT DETECTED NOT DETECTED Final   Candida parapsilosis NOT DETECTED NOT DETECTED Final   Candida tropicalis NOT DETECTED NOT DETECTED Final   Cryptococcus neoformans/gattii NOT DETECTED NOT DETECTED Final    Comment: Performed at [facility], [address], [place], [place] [address]    Studies/Results: ECHOCARDIOGRAM COMPLETE  Result Date: 01/06/2022    ECHOCARDIOGRAM REPORT   Patient Name:    JAILYNN LAVALAIS Date of Exam: 01/06/2022 Medical Rec #:  [id number]        Height:       63.0 in Accession #:    [id number]       Weight:       131.8 lb Date of Birth:  1955/12/05       BSA:          1.620 m Patient Age:    66 years         BP:           140/100 mmHg Patient Gender: F                HR:           100 bpm. Exam Location:  Inpatient Procedure: 2D Echo, 3D Echo, Cardiac Doppler and Color Doppler Indications:    Bacteremia  History:        Patient has prior history of Echocardiogram  examinations, most                 recent 11/23/2021. Abnormal ECG, Arrythmias:Atrial Fibrillation,                 Signs/Symptoms:Altered Mental Status, Hypotension and Dyspnea;                 Risk Factors:Hypertension and Current Smoker. ETOH.                 Benzodiazapine withdrawal.  Sonographer:    Roseanna Rainbow RDCS Referring Phys: [place]  1. Left ventricular ejection fraction by 3D volume is 63 %. The left ventricle has normal function. The left ventricle has no regional wall motion abnormalities. Left ventricular diastolic parameters are indeterminate.  2. Right ventricular systolic function is normal. The right ventricular size is normal. There is normal pulmonary artery systolic pressure. The estimated right ventricular systolic pressure is 76.5 mmHg.  3. Left atrial size was mildly dilated.  4. The mitral valve is normal in structure. Mild mitral valve regurgitation. No evidence of mitral stenosis.  5. The aortic valve is tricuspid. There is mild calcification of the aortic valve. Aortic valve regurgitation is not visualized. No aortic stenosis is present.  6. The inferior vena cava is normal in size with greater than 50% respiratory variability, suggesting right atrial pressure of 3 mmHg.  7. Rhythm strip during this exam demonstrates premature atrial contractions. FINDINGS  Left Ventricle: Left ventricular ejection fraction by 3D volume is 63 %. The left ventricle has normal function.  The left ventricle has no regional wall motion abnormalities. The left ventricular internal cavity size was normal in size. There is no left  ventricular hypertrophy. Left ventricular diastolic parameters are indeterminate. Right Ventricle: The right ventricular size is normal. No increase in right ventricular wall thickness. Right ventricular systolic function is normal. There is normal pulmonary artery systolic pressure. The tricuspid regurgitant velocity is 2.38 m/s, and  with an assumed right atrial pressure of 3 mmHg, the estimated right ventricular systolic pressure is 46.5 mmHg. Left Atrium: Left atrial size was mildly dilated. Right Atrium: Right atrial size was normal in size. Pericardium: There is no evidence of pericardial effusion. Mitral Valve: The mitral valve is normal in structure. Mild mitral valve regurgitation. No evidence of mitral valve stenosis. Tricuspid Valve: The tricuspid valve is normal in structure. Tricuspid valve regurgitation is trivial. No evidence of tricuspid stenosis. Aortic Valve: The aortic valve is tricuspid. There is mild calcification of the aortic valve. Aortic valve regurgitation is not visualized. No aortic stenosis is present. Pulmonic Valve: The pulmonic valve was normal in structure. Pulmonic valve regurgitation is trivial. No evidence of pulmonic stenosis. Aorta: The aortic root is normal in size and structure. Venous: The inferior vena cava is normal in size with greater than 50% respiratory variability, suggesting right atrial pressure of 3 mmHg. IAS/Shunts: No atrial level shunt detected by color flow Doppler. EKG: Rhythm strip during this exam demonstrates premature atrial contractions.  LEFT VENTRICLE PLAX 2D LVIDd:         4.00 cm         Diastology LVIDs:         2.80 cm         LV e' medial:    7.62 cm/s LV PW:         1.00 cm         LV E/e' medial:  10.6 LV IVS:  0.90 cm         LV e' lateral:   9.46 cm/s LVOT diam:     2.10 cm         LV E/e' lateral:  8.5 LV SV:         62 LV SV Index:   38 LVOT Area:     3.46 cm        3D Volume EF                                LV 3D EF:    Left                                             ventricul LV Volumes (MOD)                            ar LV vol d, MOD    75.7 ml                    ejection A2C:                                        fraction LV vol d, MOD    84.6 ml                    by 3D A4C:                                        volume is LV vol s, MOD    29.6 ml                    63 %. A2C: LV vol s, MOD    36.0 ml A4C:                           3D Volume EF: LV SV MOD A2C:   46.1 ml       3D EF:        63 % LV SV MOD A4C:   84.6 ml       LV EDV:       140 ml LV SV MOD BP:    49.1 ml       LV ESV:       52 ml                                LV SV:        87 ml RIGHT VENTRICLE             IVC RV S prime:     13.50 cm/s  IVC diam: 1.80 cm TAPSE (M-mode): 2.0 cm LEFT ATRIUM             Index        RIGHT ATRIUM           Index LA diam:        3.10 cm 1.91 cm/m   RA Area:  10.20 cm LA Vol (A2C):   63.8 ml 39.39 ml/m  RA Volume:   20.60 ml  12.72 ml/m LA Vol (A4C):   42.4 ml 26.18 ml/m LA Biplane Vol: 55.8 ml 34.45 ml/m  AORTIC VALVE             PULMONIC VALVE LVOT Vmax:   103.35 cm/s PR End Diast Vel: 1.29 msec LVOT Vmean:  71.000 cm/s LVOT VTI:    0.180 m  AORTA Ao Root diam: 3.50 cm Ao Asc diam:  3.30 cm MITRAL VALVE               TRICUSPID VALVE MV Area (PHT): 4.79 cm    TR Peak grad:   22.7 mmHg MV Decel Time: 158 msec    TR Vmax:        238.00 cm/s MV E velocity: 80.62 cm/s MV A velocity: 87.55 cm/s  SHUNTS MV E/A ratio:  0.92        Systemic VTI:  0.18 m                            Systemic Diam: 2.10 cm Cherlynn Kaiser MD Electronically signed by Cherlynn Kaiser MD Signature Date/Time: 01/06/2022/8:21:23 PM    Final       Assessment/Plan:  INTERVAL HISTORY: TTE without vegetaitons   Active Problems:   GERD (gastroesophageal reflux disease)   Nausea vomiting and diarrhea   Depression    Anxiety disorder   HTN (hypertension)   Paroxysmal atrial fibrillation (HCC)   Protein-calorie malnutrition (HCC)   COPD (chronic obstructive pulmonary disease) (HCC)   Sepsis ([place])   Dysphagia   Ambulatory dysfunction   Colitis   Bacteremia due to Enterococcus    Karen Casey is a 66 y.o. female withwith past med history significant for alcoholism which she stopped 6 to 8 months ago and went through significant withdrawal paroxysmal atrial fibrillation tobacco abuse COPD anxiety depression who presented to [facility] with uncontrollable diarrhea along with nausea and vomiting.  CT in the ER showed evidence of enterocolitis.  Cultures were taken and the patient was placed on ceftriaxone and tonight is all.  She has improved clinically IV access with subsequent loss one of the 2 admission blood cultures is now growing Enterococcus avium.   Without IV access we started zyvox and held her buspar  Her TTE was negative  I had planned on giving her 2 weeks of zyvox and then checking surveillance blood cultures 2 weeks after this  If she goes to SNF with Zyvox she should have weekly CBC w Diff faxed to [facility] at [phone]  IF this is not going to be covered would give her two weeks of augmentin  [place]: would give her 10 days of fluconazole  I spent 36 minutes with the patient including than 50% of the time in face to face counseling of the patient re her bacteremia  along with review of medical records in preparation for the visit and during the visit and in coordination of her care.   ROSEALEE RECINOS has an appointment on 02/10/2022 at 315 PM with Dr. Tommy Medal at   [facility] is located in the [facility] which is located at  [address] in [place].  [address], which is located to the left of the elevators.  Phone: [phone]  Fax: [phone]  [email or web address]  She should arrive  30 minutes prior to her appointment.  I will sign off for now  Please call with further questions.    LOS: 5 days   Alcide Evener 01/07/2022, 1:48 PM

## 2022-01-07 NOTE — Discharge Summary (Addendum)
Physician Discharge Summary  Karen Casey XTK:[id number] DOB: [birth year]  PCP: [name], MD  Admitted from: Home Discharged to: Home  All discharge paperwork for patient to go to SNF was completed.  However TOC team then updated that patient only has Medicare part A coverage and hence will not be able to go to SNF and will be returning home with home health services.  Admit date: 01/02/2022 Discharge date: 01/07/2022  Recommendations for Outpatient Follow-up:    Contact information for follow-up providers     MD at SNF. Schedule an appointment as soon as possible for a visit.   Why: To be seen in 2 to 3 days.  Also need to follow-up weekly labs (CBC & CMP).  Patient will need to follow-up in the infectious disease clinic who will arrange the appointment.        Tommy Medal, Lavell Islam, MD Follow up on 02/10/2022.   Specialty: Infectious Diseases Why: 3:15pm. Contact information: [address] [place] [address] [phone]         [name], MD. Schedule an appointment as soon as possible for a visit.   Specialty: Family Medicine Why: Follow-up postdischarge from SNF. Contact information: [address] [place] [address] [phone]              Contact information for after-discharge care     Destination     [facility] .   Service: Skilled Nursing Contact information: [address] [place] [phone]                      Home Health: PT, OT, RN for lab draws, social work and aide.    Equipment/Devices: None.    Discharge Condition: Improved and stable.   Code Status: Full Code Diet recommendation:  Discharge Diet Orders (From admission, onward)     Start     Ordered   01/07/22 0000  Diet - low sodium heart healthy       Comments: As per speech therapy recommendations as follows:  Regular;Thin liquid;Dysphagia 3 (Mech soft)    Liquid  Administration via: Cup;Straw Medication Administration: Whole meds with liquid Supervision: Patient able to self feed Compensations: Minimize environmental distractions;Slow rate;Small sips/bites Postural Changes: Seated upright at 90 degrees;Remain upright for at least 30 minutes after po intake   01/07/22 1353             Discharge Diagnoses:  Active Problems:   Paroxysmal atrial fibrillation (HCC)   COPD (chronic obstructive pulmonary disease) (HCC)   GERD (gastroesophageal reflux disease)   Nausea vomiting and diarrhea   Depression   Anxiety disorder   HTN (hypertension)   Protein-calorie malnutrition (HCC)   Sepsis ([place])   Dysphagia   Ambulatory dysfunction   Colitis   Bacteremia due to Enterococcus   Brief History/Interval Summary: 66 y.o. female with medical history significant of GERD, PAF, EtOH abuse, tobacco abuse, COPD, anxiety/depression.  Presented with 3-day history of diarrhea.  CT scan suggested enterocolitis.  Recent antibiotic use reported by patient for UTI.  Patient hospitalized for further management.  C. difficile panel and GI panel were negative.  Diarrhea improved or even resolved.  Subsequently noted to have Enterococcus Avium bacteremia, ID consulted.   Consultants: Infectious disease   Procedures: None       Subjective/Interval History: "I am ready to go to rehab".  No complaints reported.  Did not report difficulty swallowing  today.  Having normal BMs.  Denies diarrhea.  No pain.     Assessment/Plan:   Acute enterocolitis/severe diarrhea/sepsis present at admission She had leukocytosis and tachycardia at the time of admission.  She was afebrile.  Lactic acid level noted to be mildly elevated.  Partly due to hypovolemia. Recent antibiotic use for urinary tract infection. C. difficile and GI panel negative.  Enteric precautions discontinued. Patient had WBC of 21,000 at the time of admission.  She was started on ceftriaxone and metronidazole  empirically.   Leukocytosis resolved. CT scan suggested enterocolitis without any other acute findings.   Completed 4 days of ceftriaxone and metronidazole as of 7/25.  IV has fallen off and patient refusing reinsertion.  Imodium single dose on 7/24 may have helped.  Probiotics initiated were discontinued by ID due to concern for causing bacteremia. Initial plans were to transition to Augmentin to complete total 7 days of therapy but since she has bacteremia (see discussion below), ID changed her to Zyvox to complete total 2 weeks course for the bacteremia.   Enterococcus Avium Bacteremia 1 out of 2 bottles showed Enterococcus avium.  ID consultation appreciated.  TTE 7/26 without mention of endocarditis.  IV team unable to get IV access.  Thereby ID has placed patient on Zyvox x2 weeks and have arranged follow-up in ID clinic.  Probiotics discontinued due to risk for bacteremia.  BuSpar stopped due to concern for serotonin syndrome with antidepressants and Zyvox.  I discussed with Dr. Tommy Medal regarding patient being on nortriptyline, he advised that this was carefully considered along with 2 of the ID pharmacist and it was decided to continue nortriptyline while on Zyvox.  Addendum: As per [facility] team update, recommends discharging patient with a printed prescription for Zyvox which patient can fill at [facility].  The cost for patient along with Good Rx will be $58 which reportedly patient can afford.  Also arranging home health RN for weekly lab draws which can be forwarded to PCP and our CAD clinic for review.  Severe hypokalemia/hypomagnesemia Replaced.  Normocytic anemia Drop in hemoglobin is likely dilutional.  No overt bleeding noted.  Hemoglobin has come up from 11.4-12.2.  Paroxysmal atrial fibrillation EKG suggests sinus tachycardia with PACs.  Not on chronic anticoagulation as determined by her outpatient providers.  Telemetry shows sinus rhythm.  Does not appear to be on rate  control medications.   History of COPD Stable respiratory status noted.  Continue PTA respiratory meds.   Essential hypertension Blood pressure well controlled despite holding home amlodipine which was thereby discontinued.  Close outpatient follow-up.  History of peripheral neuropathy/neuromuscular disease/ambulatory dysfunction Nonambulatory at baseline.  Uses a wheelchair to get around. As per extensive communication by patient's PCP since 7/26, PCP reached out to [facility] provider yesterday to advise that the office had been trying to get her into a SNF from outpatient as she was unable to take care of herself at home and recommended SNF at discharge from the hospital.  Although patient lives with her spouse, he cannot lift her so she reportedly mostly sits in bed and has BM there, unable to walk due to her polyneuropathy and back pain and depression and she reportedly has given up on trying and is never able to make it into clinic as patient's spouse cannot lift her into a car.  Therapies now recommend SNF and patient is agreeable.  It appears that patient has a neurology outpatient follow-up.  Previous history of alcohol and tobacco abuse  Patient denies current use.  History of depression and anxiety BuSpar discontinued as noted above.  Continue nortriptyline.   Dysphagia/oropharyngeal candidiasis: Reports ongoing for the last 3 to 4 days since hospital admission.  Has been tolerating applesauce and some soft foods.  Throat exam with a tongue depressor without acute findings.  SLP evaluation appreciated and diet consistency per their recommendations.  Although I did not appreciate any oral thrush, it appears that ID team did and have placed her on Diflucan.  I discussed with Dr. Tommy Medal who recommended continuing total 10 days of Diflucan given some concern for dysphagia related to possible esophageal thrush.     Discharge Instructions  Discharge Instructions     Call MD for:  difficulty  breathing, headache or visual disturbances   Complete by: As directed    Call MD for:  extreme fatigue   Complete by: As directed    Call MD for:  persistant dizziness or light-headedness   Complete by: As directed    Call MD for:  persistant nausea and vomiting   Complete by: As directed    Call MD for:  severe uncontrolled pain   Complete by: As directed    Call MD for:  temperature >100.4   Complete by: As directed    Diet - low sodium heart healthy   Complete by: As directed    As per speech therapy recommendations as follows:  Regular;Thin liquid;Dysphagia 3 (Mech soft)    Liquid Administration via: Cup;Straw Medication Administration: Whole meds with liquid Supervision: Patient able to self feed Compensations: Minimize environmental distractions;Slow rate;Small sips/bites Postural Changes: Seated upright at 90 degrees;Remain upright for at least 30 minutes after po intake   Increase activity slowly   Complete by: As directed    No wound care   Complete by: As directed         Medication List     STOP taking these medications    amLODipine 5 MG tablet Commonly known as: NORVASC   Anoro Ellipta 62.5-25 MCG/ACT Aepb Generic drug: umeclidinium-vilanterol   busPIRone 10 MG tablet Commonly known as: BUSPAR   famotidine 20 MG tablet Commonly known as: PEPCID   nicotine 21 mg/24hr patch Commonly known as: NICODERM CQ - dosed in mg/24 hours   nitrofurantoin (macrocrystal-monohydrate) 100 MG capsule Commonly known as: MACROBID   ondansetron 4 MG disintegrating tablet Commonly known as: ZOFRAN-ODT       TAKE these medications    acetaminophen 650 MG CR tablet Commonly known as: TYLENOL Take 1 tablet (650 mg total) by mouth every 6 (six) hours as needed for pain. What changed:  how much to take when to take this   albuterol 108 (90 Base) MCG/ACT inhaler Commonly known as: VENTOLIN HFA Inhale 2 puffs into the lungs every 6 (six) hours as needed for  wheezing or shortness of breath.   CALCIUM-MAGNESIUM-ZINC PO Take 1 tablet by mouth daily.   CENTRUM SILVER 50+WOMEN PO Take 1 tablet by mouth daily.   feeding supplement Liqd Take 237 mLs by mouth daily.   fluconazole 200 MG tablet Commonly known as: DIFLUCAN Take 1 tablet (200 mg total) by mouth daily for 9 days. Start taking on: January 09, 6760   folic acid 1 MG tablet Commonly known as: FOLVITE Take 1 tablet (1 mg total) by mouth daily.   gabapentin 600 MG tablet Commonly known as: NEURONTIN TAKE 1 TABLET BY MOUTH THREE TIMES A DAY What changed: how much to take   linezolid  600 MG tablet Commonly known as: ZYVOX Take 1 tablet (600 mg total) by mouth every 12 (twelve) hours for 13 days. Monitor weekly CBC (next draw 01/12/2022) while on Zyvox.  Home health RN to draw labs and forward to and forward to patient's PCP and Dr. Tommy Medal at [facility] clinic.   nortriptyline 25 MG capsule Commonly known as: PAMELOR Take 3 capsules (75 mg total) by mouth at bedtime. This was patient's prior to admission home dose that she was taking. What changed: See the new instructions.   OXYGEN Inhale into the lungs as needed.   pantoprazole 40 MG tablet Commonly known as: PROTONIX Take 1 tablet (40 mg total) by mouth every morning.   Trelegy Ellipta 100-62.5-25 MCG/ACT Aepb Generic drug: Fluticasone-Umeclidin-Vilant Inhale 2 puffs into the lungs daily.   VITAMIN D3 PO Take 1 capsule by mouth daily.       Allergies  Allergen Reactions   Capsaicin-Menthol Other (See Comments)    Burning and peeling on area applied to   Diclo Gel [Diclofenac Sodium] Other (See Comments)    GEL AND CREAM-burning and peeling at the sight of application.      Procedures/Studies: ECHOCARDIOGRAM COMPLETE  Result Date: 01/06/2022    ECHOCARDIOGRAM REPORT   Patient Name:   CARAL WHAN Date of Exam: 01/06/2022 Medical Rec #:  [id number]        Height:       63.0 in Accession #:    [id number]        Weight:       131.8 lb Date of Birth:  1956/02/23       BSA:          1.620 m Patient Age:    66 years         BP:           140/100 mmHg Patient Gender: F                HR:           100 bpm. Exam Location:  Inpatient Procedure: 2D Echo, 3D Echo, Cardiac Doppler and Color Doppler Indications:    Bacteremia  History:        Patient has prior history of Echocardiogram examinations, most                 recent 11/23/2021. Abnormal ECG, Arrythmias:Atrial Fibrillation,                 Signs/Symptoms:Altered Mental Status, Hypotension and Dyspnea;                 Risk Factors:Hypertension and Current Smoker. ETOH.                 Benzodiazapine withdrawal.  Sonographer:    Roseanna Rainbow RDCS Referring Phys: [place]  1. Left ventricular ejection fraction by 3D volume is 63 %. The left ventricle has normal function. The left ventricle has no regional wall motion abnormalities. Left ventricular diastolic parameters are indeterminate.  2. Right ventricular systolic function is normal. The right ventricular size is normal. There is normal pulmonary artery systolic pressure. The estimated right ventricular systolic pressure is 28.4 mmHg.  3. Left atrial size was mildly dilated.  4. The mitral valve is normal in structure. Mild mitral valve regurgitation. No evidence of mitral stenosis.  5. The aortic valve is tricuspid. There is mild calcification of the aortic valve. Aortic valve regurgitation is not visualized. No aortic stenosis  is present.  6. The inferior vena cava is normal in size with greater than 50% respiratory variability, suggesting right atrial pressure of 3 mmHg.  7. Rhythm strip during this exam demonstrates premature atrial contractions. FINDINGS  Left Ventricle: Left ventricular ejection fraction by 3D volume is 63 %. The left ventricle has normal function. The left ventricle has no regional wall motion abnormalities. The left ventricular internal cavity size was normal in size. There  is no left  ventricular hypertrophy. Left ventricular diastolic parameters are indeterminate. Right Ventricle: The right ventricular size is normal. No increase in right ventricular wall thickness. Right ventricular systolic function is normal. There is normal pulmonary artery systolic pressure. The tricuspid regurgitant velocity is 2.38 m/s, and  with an assumed right atrial pressure of 3 mmHg, the estimated right ventricular systolic pressure is 09.3 mmHg. Left Atrium: Left atrial size was mildly dilated. Right Atrium: Right atrial size was normal in size. Pericardium: There is no evidence of pericardial effusion. Mitral Valve: The mitral valve is normal in structure. Mild mitral valve regurgitation. No evidence of mitral valve stenosis. Tricuspid Valve: The tricuspid valve is normal in structure. Tricuspid valve regurgitation is trivial. No evidence of tricuspid stenosis. Aortic Valve: The aortic valve is tricuspid. There is mild calcification of the aortic valve. Aortic valve regurgitation is not visualized. No aortic stenosis is present. Pulmonic Valve: The pulmonic valve was normal in structure. Pulmonic valve regurgitation is trivial. No evidence of pulmonic stenosis. Aorta: The aortic root is normal in size and structure. Venous: The inferior vena cava is normal in size with greater than 50% respiratory variability, suggesting right atrial pressure of 3 mmHg. IAS/Shunts: No atrial level shunt detected by color flow Doppler. EKG: Rhythm strip during this exam demonstrates premature atrial contractions.  LEFT VENTRICLE PLAX 2D LVIDd:         4.00 cm         Diastology LVIDs:         2.80 cm         LV e' medial:    7.62 cm/s LV PW:         1.00 cm         LV E/e' medial:  10.6 LV IVS:        0.90 cm         LV e' lateral:   9.46 cm/s LVOT diam:     2.10 cm         LV E/e' lateral: 8.5 LV SV:         62 LV SV Index:   38 LVOT Area:     3.46 cm        3D Volume EF                                LV 3D EF:     Left                                             ventricul LV Volumes (MOD)                            ar LV vol d, MOD    75.7 ml  ejection A2C:                                        fraction LV vol d, MOD    84.6 ml                    by 3D A4C:                                        volume is LV vol s, MOD    29.6 ml                    63 %. A2C: LV vol s, MOD    36.0 ml A4C:                           3D Volume EF: LV SV MOD A2C:   46.1 ml       3D EF:        63 % LV SV MOD A4C:   84.6 ml       LV EDV:       140 ml LV SV MOD BP:    49.1 ml       LV ESV:       52 ml                                LV SV:        87 ml RIGHT VENTRICLE             IVC RV S prime:     13.50 cm/s  IVC diam: 1.80 cm TAPSE (M-mode): 2.0 cm LEFT ATRIUM             Index        RIGHT ATRIUM           Index LA diam:        3.10 cm 1.91 cm/m   RA Area:     10.20 cm LA Vol (A2C):   63.8 ml 39.39 ml/m  RA Volume:   20.60 ml  12.72 ml/m LA Vol (A4C):   42.4 ml 26.18 ml/m LA Biplane Vol: 55.8 ml 34.45 ml/m  AORTIC VALVE             PULMONIC VALVE LVOT Vmax:   103.35 cm/s PR End Diast Vel: 1.29 msec LVOT Vmean:  71.000 cm/s LVOT VTI:    0.180 m  AORTA Ao Root diam: 3.50 cm Ao Asc diam:  3.30 cm MITRAL VALVE               TRICUSPID VALVE MV Area (PHT): 4.79 cm    TR Peak grad:   22.7 mmHg MV Decel Time: 158 msec    TR Vmax:        238.00 cm/s MV E velocity: 80.62 cm/s MV A velocity: 87.55 cm/s  SHUNTS MV E/A ratio:  0.92        Systemic VTI:  0.18 m                            Systemic Diam: 2.10 cm Cherlynn Kaiser MD Electronically signed by Cherlynn Kaiser  MD Signature Date/Time: 01/06/2022/8:21:23 PM    Final    DG CHEST PORT 1 VIEW  Result Date: 01/02/2022 CLINICAL DATA:  Sepsis.  Complains of nausea, vomiting and diarrhea. EXAM: PORTABLE CHEST 1 VIEW COMPARISON:  12/11/2021 FINDINGS: Heart size and mediastinal contours are unremarkable. There is no pleural effusion or edema. No airspace opacities identified. The  visualized osseous structures are unremarkable. IMPRESSION: No active disease. Electronically Signed   By: Kerby Moors M.D.   On: 01/02/2022 14:15   CT ABDOMEN PELVIS W CONTRAST  Result Date: 01/02/2022 CLINICAL DATA:  Abdominal pain, nausea, vomiting, diarrhea EXAM: CT ABDOMEN AND PELVIS WITH CONTRAST TECHNIQUE: Multidetector CT imaging of the abdomen and pelvis was performed using the standard protocol following bolus administration of intravenous contrast. RADIATION DOSE REDUCTION: This exam was performed according to the departmental dose-optimization program which includes automated exposure control, adjustment of the mA and/or kV according to patient size and/or use of iterative reconstruction technique. CONTRAST:  145m OMNIPAQUE IOHEXOL 300 MG/ML  SOLN COMPARISON:  12/11/2021 FINDINGS: Lower chest: Small linear densities in the lingula suggest scarring or subsegmental atelectasis. Hepatobiliary: There is marked fatty infiltration of the liver. In image 20 of series 2, there is a 8 mm low-density, possibly a cyst. There is no dilation of bile ducts. Gallbladder is unremarkable. Pancreas: Pancreas appears smaller than usual in size. No focal abnormalities are seen. Spleen: Unremarkable. Adrenals/Urinary Tract: Adrenals are not enlarged. There is no hydronephrosis. There are no renal or ureteral stones. Urinary bladder is not distended. There is possible subcentimeter cyst in the upper pole of left kidney. Stomach/Bowel: Small hiatal hernia is seen. Stomach is unremarkable. There is mild dilation of small bowel loops. There is fluid in the lumen of small bowel loops. Appendix is not seen. There is mild diffuse wall thickening in ascending colon. There is incomplete distention of ascending colon. Liquid stool is seen in colon. Vascular/Lymphatic: Scattered atherosclerotic plaques and calcifications are seen. Reproductive: Unremarkable. Other: Trace amount of free fluid is seen in pelvis. There is no  pneumoperitoneum. Musculoskeletal: Last lumbar vertebra is transitional. Degenerative changes are noted at the L4-L5 level with encroachment of neural foramina. IMPRESSION: There is mild dilation of small bowel loops along with fluid in the lumen. There is wall thickening in ascending colon. There is liquid stool in colon. Findings suggest enterocolitis. There is no evidence of intestinal obstruction or pneumoperitoneum. There is no hydronephrosis. Fatty liver.  Small hiatal hernia. Other findings as described in the body of the report. Electronically Signed   By: PElmer PickerM.D.   On: 01/02/2022 12:20    Discharge Exam:  Vitals:   01/06/22 2036 01/07/22 0419 01/07/22 0807 01/07/22 1303  BP: 118/71 133/84  128/71  Pulse: 93 85  89  Resp: '18 16  18  '$ Temp: 98 F (36.7 C) (!) 97.4 F (36.3 C)  98.2 F (36.8 C)  TempSrc: Oral   Oral  SpO2: 95% 100% 97% 100%  Weight:      Height:       General appearance: Middle-age female, moderately built and poorly nourished, looks older than stated age, lying comfortably propped up in bed without distress. Resp: Clear to auscultation.  No increased work of breathing. Cardio: S1 and S2 heard, RRR.  No JVD, murmurs or pedal edema.  Telemetry personally reviewed: Sinus rhythm. GI: Abdomen is soft.  Nontender nondistended.  Bowel sounds are present normal.  No masses organomegaly Extremities: No edema.  Moves all limbs symmetrically well. CNS:  Alert and oriented.  No focal neurological deficits.   The results of significant diagnostics from this hospitalization (including imaging, microbiology, ancillary and laboratory) are listed below for reference.     Microbiology: Recent Results (from the past 240 hour(s))  Gastrointestinal Panel by PCR , Stool     Status: None   Collection Time: 01/02/22  1:12 PM   Specimen: STOOL  Result Value Ref Range Status   Campylobacter species NOT DETECTED NOT DETECTED Final   Plesimonas shigelloides NOT  DETECTED NOT DETECTED Final   Salmonella species NOT DETECTED NOT DETECTED Final   Yersinia enterocolitica NOT DETECTED NOT DETECTED Final   Vibrio species NOT DETECTED NOT DETECTED Final   Vibrio cholerae NOT DETECTED NOT DETECTED Final   Enteroaggregative E coli (EAEC) NOT DETECTED NOT DETECTED Final   Enteropathogenic E coli (EPEC) NOT DETECTED NOT DETECTED Final   Enterotoxigenic E coli (ETEC) NOT DETECTED NOT DETECTED Final   Shiga like toxin producing E coli (STEC) NOT DETECTED NOT DETECTED Final   Shigella/Enteroinvasive E coli (EIEC) NOT DETECTED NOT DETECTED Final   Cryptosporidium NOT DETECTED NOT DETECTED Final   Cyclospora cayetanensis NOT DETECTED NOT DETECTED Final   Entamoeba histolytica NOT DETECTED NOT DETECTED Final   Giardia lamblia NOT DETECTED NOT DETECTED Final   Adenovirus F40/41 NOT DETECTED NOT DETECTED Final   Astrovirus NOT DETECTED NOT DETECTED Final   Norovirus GI/GII NOT DETECTED NOT DETECTED Final   Rotavirus A NOT DETECTED NOT DETECTED Final   Sapovirus (I, II, IV, and V) NOT DETECTED NOT DETECTED Final    Comment: Performed at [facility], [address] [place], [place] [address]  C Difficile Quick Screen w PCR reflex     Status: None   Collection Time: 01/02/22  1:34 PM   Specimen: STOOL  Result Value Ref Range Status   C Diff antigen NEGATIVE NEGATIVE Final   C Diff toxin NEGATIVE NEGATIVE Final   C Diff interpretation No C. difficile detected.  Final    Comment: Performed at [facility], [place] [address] [place], [place] [address]  Culture, blood (Routine X 2) w Reflex to ID Panel     Status: Abnormal   Collection Time: 01/02/22  4:48 PM   Specimen: BLOOD RIGHT HAND  Result Value Ref Range Status   Specimen Description   Final    BLOOD RIGHT HAND Performed at [facility], [place] [address], [place], [place] [address]    Special Requests   Final    IN PEDIATRIC BOTTLE Blood Culture adequate volume Performed  at [place] [address] [place], [place] [address]    Culture  Setup Time   Final    GRAM POSITIVE COCCI IN CHAINS IN PEDIATRIC BOTTLE CRITICAL RESULT CALLED TO, READ BACK BY AND VERIFIED WITH: RN Alyson Locket [phone] '@[phone]'$  FH Performed at [facility], [place] [address] [place], [place] [address]    Culture ENTEROCOCCUS AVIUM (A)  Final   Report Status 01/07/2022 FINAL  Final   Organism ID, Bacteria ENTEROCOCCUS AVIUM  Final      Susceptibility   Enterococcus avium - MIC*    AMPICILLIN <=2 SENSITIVE Sensitive     VANCOMYCIN <=0.5 SENSITIVE Sensitive     GENTAMICIN SYNERGY SENSITIVE Sensitive     * ENTEROCOCCUS AVIUM  Culture, blood (Routine X 2) w Reflex to ID Panel     Status: None   Collection Time: 01/02/22  4:48 PM  Specimen: BLOOD RIGHT HAND  Result Value Ref Range Status   Specimen Description   Final    BLOOD RIGHT HAND Performed at [facility], [place] [address] [place], [place] [address]    Special Requests   Final    BOTTLES DRAWN AEROBIC ONLY Blood Culture adequate volume Performed at [facility] [address], [place], [place] [address]    Culture   Final    NO GROWTH 5 DAYS Performed at [facility], [place] [address] [place], [place] [address]    Report Status 01/07/2022 FINAL  Final  Blood Culture ID Panel (Reflexed)     Status: None   Collection Time: 01/02/22  4:48 PM  Result Value Ref Range Status   Enterococcus faecalis NOT DETECTED NOT DETECTED Final   Enterococcus Faecium NOT DETECTED NOT DETECTED Final   Listeria monocytogenes NOT DETECTED NOT DETECTED Final   Staphylococcus species NOT DETECTED NOT DETECTED Final   Staphylococcus aureus (BCID) NOT DETECTED NOT DETECTED Final   Staphylococcus epidermidis NOT DETECTED NOT DETECTED Final   Staphylococcus lugdunensis NOT DETECTED NOT DETECTED Final   Streptococcus species NOT DETECTED NOT DETECTED Final   Streptococcus agalactiae NOT DETECTED  NOT DETECTED Final   Streptococcus pneumoniae NOT DETECTED NOT DETECTED Final   Streptococcus pyogenes NOT DETECTED NOT DETECTED Final   A.calcoaceticus-baumannii NOT DETECTED NOT DETECTED Final   Bacteroides fragilis NOT DETECTED NOT DETECTED Final   Enterobacterales NOT DETECTED NOT DETECTED Final   Enterobacter cloacae complex NOT DETECTED NOT DETECTED Final   Escherichia coli NOT DETECTED NOT DETECTED Final   Klebsiella aerogenes NOT DETECTED NOT DETECTED Final   Klebsiella oxytoca NOT DETECTED NOT DETECTED Final   Klebsiella pneumoniae NOT DETECTED NOT DETECTED Final   Proteus species NOT DETECTED NOT DETECTED Final   Salmonella species NOT DETECTED NOT DETECTED Final   Serratia marcescens NOT DETECTED NOT DETECTED Final   Haemophilus influenzae NOT DETECTED NOT DETECTED Final   Neisseria meningitidis NOT DETECTED NOT DETECTED Final   Pseudomonas aeruginosa NOT DETECTED NOT DETECTED Final   Stenotrophomonas maltophilia NOT DETECTED NOT DETECTED Final   Candida albicans NOT DETECTED NOT DETECTED Final   Candida auris NOT DETECTED NOT DETECTED Final   Candida glabrata NOT DETECTED NOT DETECTED Final   Candida krusei NOT DETECTED NOT DETECTED Final   Candida parapsilosis NOT DETECTED NOT DETECTED Final   Candida tropicalis NOT DETECTED NOT DETECTED Final   Cryptococcus neoformans/gattii NOT DETECTED NOT DETECTED Final    Comment: Performed at [facility], [address], [place], [place] [address]     Labs: CBC: Recent Labs  Lab 01/02/22 1035 01/03/22 0903 01/04/22 0455 01/06/22 1104  WBC 21.1* 12.1* 8.0 7.9  NEUTROABS 16.6* 9.3*  --  5.8  HGB 15.6* 11.9* 11.4* 12.2  HCT 45.5 34.7* 34.5* 37.9  MCV 92.9 94.8 96.4 100.0  PLT 260 153 152 712    Basic Metabolic Panel: Recent Labs  Lab 01/02/22 1035 01/03/22 0903 01/04/22 0455 01/05/22 1033 01/05/22 1728 01/06/22 0637 01/07/22 0529  NA 134* 138 134* 130*  --  133* 134*  K 3.5 2.6* 3.6 5.7*  5.5* 5.1 5.0 3.8   CL 100 107 109 108  --  110 107  CO2 21* 23 20* 19*  --  18* 21*  GLUCOSE 121* 84 81 108*  --  77 89  BUN '11 8 8 '$ 6*  --  <5* <5*  CREATININE 0.87 0.63 0.43* 0.44  --  0.56 0.61  CALCIUM 8.2* 7.9* 7.6* 7.3*  --  8.0* 7.9*  MG 1.5*  --  1.4* 2.1  --  1.8  --     Liver Function Tests: Recent Labs  Lab 01/02/22 1035 01/03/22 0903 01/04/22 0455  AST 33 21 19  ALT '20 17 16  '$ ALKPHOS 112 87 85  BILITOT 0.8 0.7 0.5  PROT 5.7* 4.5* 4.4*  ALBUMIN 2.4* 1.8* 1.8*    Unable to reach patient's significant other listed on the chart via phone.  Also unable to reach patient's daughter and VM mailbox was full.  I was able to discuss with patient's sister, updated care and discharge plans.  Time coordinating discharge: 45 minutes  SIGNED:  Vernell Leep, MD,  FACP, [facility], [facility], [facility] (Care Management Physician Certified). [facility]  To contact the attending provider between 7A-7P or the covering provider during after hours 7P-7A, please log into the web site [email or web address] and access using universal [place] password for that web site. If you do not have the password, please call the hospital operator.

## 2022-01-07 NOTE — Progress Notes (Signed)
Speech Language Pathology Treatment: Dysphagia  Patient Details Name: Karen Casey MRN: [id number] DOB: 09-13-1955 Today's Date: 01/07/2022 Time: 9702-6378 SLP Time Calculation (min) (ACUTE ONLY): 15 min  Assessment / Plan / Recommendation Clinical Impression  Pt seen at bedside for skilled ST intervention targeting goals for dysphagia. Pt was seated upright in bed self feeding lunch upon arrival of SLP. SLP assisted with cutting items on tray, but pt was able to feed herself without difficulty. No overt s/s aspiration observed following any texture (spaghetti/noodles, fruit, thin liquid). Pt reports globus sensation intermittently. SLP provided education regarding the importance of small bites/sips at slow rate, upright position during and for 30 minutes after meals.   Pt being discharged to rehab today. Recommend follow up at next venue for continued education and assessment of diet tolerance.   HPI HPI: Patient is a 66 y.o. female with PMH: GERD, ETOH abuse, tobacco abuse, COPD, anxiety/depression. She was recently admitted last month after being found down at home and seen by speech due to h/o dysphagia. She had an MBS during previous admission on 09/16/2020 which showed laryngeal penetration of thin, but strong swallow reflex and no aspiration. Patient was admitted to the hospital on 01/02/22 with 3 day h/o diarrhea, nausea and vomiting but no relief when taking reflux medications (Nexium and Imodium). Patient decided to come to the ED when her symptoms didnt improve. C. difficile panel and GI panel were negative.  Diarrhea improved or even resolved. CT chest did not show any active disease.      SLP Plan  Continue with current plan of care      Recommendations for follow up therapy are one component of a multi-disciplinary discharge planning process, led by the attending physician.  Recommendations may be updated based on patient status, additional functional criteria and insurance  authorization.    Recommendations  Diet recommendations: Regular;Thin liquid Liquids provided via: Cup;Straw Medication Administration: Whole meds with liquid Supervision: Patient able to self feed;Intermittent supervision to cue for compensatory strategies Compensations: Minimize environmental distractions;Slow rate;Small sips/bites Postural Changes and/or Swallow Maneuvers: Seated upright 90 degrees;Upright 30-60 min after meal                Oral Care Recommendations: Oral care BID Follow Up Recommendations: No SLP follow up Assistance recommended at discharge: Frequent or constant Supervision/Assistance SLP Visit Diagnosis: Dysphagia, unspecified (R13.10) Plan: Continue with current plan of care          Sahalie Beth B. Quentin Ore, [facility], [place] Speech Language Pathologist Office: [phone]  Shonna Chock 01/07/2022, 2:08 PM

## 2022-01-07 NOTE — Telephone Encounter (Signed)
Patient Advocate Encounter   Prior authorization for LInezolid '600mg'$  is required/requested.   PA submitted on 01/07/22 to [employer] via CoverMyMeds Key [id number] - PA Case ID: [id number] Status is pending  Pharmacy Patient Advocate Fax:  [phone]

## 2022-01-08 NOTE — Telephone Encounter (Signed)
Patient Advocate Encounter  Received notification that the request for prior authorization for LInezolid '600mg'$   has been denied due to did not meet the criteria necessary to approve this medication.        Lyndel Safe, [place] Patient Advocate Specialist [place] Patient Advocate Team Direct Number: [phone]  Fax: [phone]

## 2022-01-09 NOTE — Progress Notes (Addendum)
Post DC Rx assistance.  I was contacted by nursing this morning (01/09/2022), that patient had reached out to her stating that she was unable to fill the linezolid prescription that she had been discharged on and was requesting an alternate medication. I personally communicated with Dr. Tommy Medal, infectious disease MD on-call who had cared for this patient prior to her discharge.  He recommended switching linezolid to Augmentin 875, twice daily x14 days. I personally called [facility]'s [facility], spoke with the pharmacist and called in the above prescription for Augmentin and at the same time canceled the prior prescription for linezolid.  I advised the RN to call patient back to pick up [facility] from [facility]. I had also advised the pharmacist to call the patient for same.   Vernell Leep, MD,  FACP, [facility], [facility] (Care Management Physician Certified) [facility] [place]  To contact the attending provider between 7A-7P or the covering provider during after hours 7P-7A, please log into the web site [email or web address] and access using universal [place] password for that web site. If you do not have the password, please call the hospital operator.

## 2022-01-22 ENCOUNTER — Ambulatory Visit: Payer: Self-pay | Admitting: Licensed Clinical Social Worker

## 2022-01-22 NOTE — Patient Outreach (Signed)
  Care Coordination   Follow Up Visit Note   01/22/2022 Name: Karen Casey MRN: [id number] DOB: 10-Jun-1956  Karen Casey is a 66 y.o. year old female who sees Pray, Norwood Levo, MD for primary care. SW collaborated with care team today. What matters to the patients health and wellness today?  Fl2 Form.    Goals Addressed             This Visit's Progress    Care Coordination        SW Collaborated with CareTeam. Fl2 is completed and located in media files.   Patient has been made aware.  Nothing further needed at this time.        SDOH assessments and interventions completed:  Yes     Care Coordination Interventions Activated:  Yes  Care Coordination Interventions:  Yes, provided   Follow up plan: No further intervention required.   Encounter Outcome:  Pt. Visit Completed   Lenor Derrick, MSW  Social Worker [facility] Care Management  [phone]

## 2022-01-25 ENCOUNTER — Inpatient Hospital Stay (HOSPITAL_COMMUNITY)
Admission: EM | Admit: 2022-01-25 | Discharge: 2022-01-29 | DRG: 392 | Disposition: A | Discharge status: Home Health | Payer: Medicare Other | Attending: Family Medicine | Admitting: Family Medicine

## 2022-01-25 ENCOUNTER — Encounter (HOSPITAL_COMMUNITY): Payer: Self-pay

## 2022-01-25 ENCOUNTER — Other Ambulatory Visit: Payer: Self-pay

## 2022-01-25 DIAGNOSIS — I959 Hypotension, unspecified: Secondary | ICD-10-CM | POA: Diagnosis not present

## 2022-01-25 DIAGNOSIS — Z818 Family history of other mental and behavioral disorders: Secondary | ICD-10-CM

## 2022-01-25 DIAGNOSIS — J441 Chronic obstructive pulmonary disease with (acute) exacerbation: Secondary | ICD-10-CM | POA: Diagnosis present

## 2022-01-25 DIAGNOSIS — K529 Noninfective gastroenteritis and colitis, unspecified: Secondary | ICD-10-CM | POA: Diagnosis not present

## 2022-01-25 DIAGNOSIS — B370 Candidal stomatitis: Secondary | ICD-10-CM

## 2022-01-25 DIAGNOSIS — G40909 Epilepsy, unspecified, not intractable, without status epilepticus: Secondary | ICD-10-CM | POA: Diagnosis present

## 2022-01-25 DIAGNOSIS — G8929 Other chronic pain: Secondary | ICD-10-CM | POA: Diagnosis present

## 2022-01-25 DIAGNOSIS — Z83438 Family history of other disorder of lipoprotein metabolism and other lipidemia: Secondary | ICD-10-CM

## 2022-01-25 DIAGNOSIS — Z79899 Other long term (current) drug therapy: Secondary | ICD-10-CM

## 2022-01-25 DIAGNOSIS — J449 Chronic obstructive pulmonary disease, unspecified: Secondary | ICD-10-CM | POA: Diagnosis present

## 2022-01-25 DIAGNOSIS — R7881 Bacteremia: Secondary | ICD-10-CM | POA: Diagnosis present

## 2022-01-25 DIAGNOSIS — R197 Diarrhea, unspecified: Secondary | ICD-10-CM | POA: Insufficient documentation

## 2022-01-25 DIAGNOSIS — E46 Unspecified protein-calorie malnutrition: Secondary | ICD-10-CM

## 2022-01-25 DIAGNOSIS — I10 Essential (primary) hypertension: Secondary | ICD-10-CM | POA: Diagnosis present

## 2022-01-25 DIAGNOSIS — Z789 Other specified health status: Secondary | ICD-10-CM | POA: Diagnosis present

## 2022-01-25 DIAGNOSIS — Z820 Family history of epilepsy and other diseases of the nervous system: Secondary | ICD-10-CM

## 2022-01-25 DIAGNOSIS — F17210 Nicotine dependence, cigarettes, uncomplicated: Secondary | ICD-10-CM | POA: Diagnosis present

## 2022-01-25 DIAGNOSIS — K2970 Gastritis, unspecified, without bleeding: Secondary | ICD-10-CM | POA: Diagnosis present

## 2022-01-25 DIAGNOSIS — F32A Depression, unspecified: Secondary | ICD-10-CM | POA: Diagnosis present

## 2022-01-25 DIAGNOSIS — Z9981 Dependence on supplemental oxygen: Secondary | ICD-10-CM

## 2022-01-25 DIAGNOSIS — Z8249 Family history of ischemic heart disease and other diseases of the circulatory system: Secondary | ICD-10-CM

## 2022-01-25 DIAGNOSIS — R12 Heartburn: Secondary | ICD-10-CM | POA: Diagnosis present

## 2022-01-25 DIAGNOSIS — F1090 Alcohol use, unspecified, uncomplicated: Secondary | ICD-10-CM | POA: Diagnosis present

## 2022-01-25 DIAGNOSIS — F419 Anxiety disorder, unspecified: Secondary | ICD-10-CM

## 2022-01-25 DIAGNOSIS — R1310 Dysphagia, unspecified: Secondary | ICD-10-CM

## 2022-01-25 DIAGNOSIS — R21 Rash and other nonspecific skin eruption: Secondary | ICD-10-CM | POA: Diagnosis present

## 2022-01-25 DIAGNOSIS — B952 Enterococcus as the cause of diseases classified elsewhere: Secondary | ICD-10-CM | POA: Diagnosis present

## 2022-01-25 DIAGNOSIS — G629 Polyneuropathy, unspecified: Secondary | ICD-10-CM | POA: Diagnosis present

## 2022-01-25 DIAGNOSIS — R000 Tachycardia, unspecified: Secondary | ICD-10-CM | POA: Diagnosis present

## 2022-01-25 DIAGNOSIS — E860 Dehydration: Secondary | ICD-10-CM | POA: Diagnosis present

## 2022-01-25 DIAGNOSIS — M549 Dorsalgia, unspecified: Secondary | ICD-10-CM | POA: Diagnosis present

## 2022-01-25 DIAGNOSIS — R112 Nausea with vomiting, unspecified: Secondary | ICD-10-CM | POA: Diagnosis present

## 2022-01-25 DIAGNOSIS — I480 Paroxysmal atrial fibrillation: Secondary | ICD-10-CM | POA: Diagnosis present

## 2022-01-25 DIAGNOSIS — I4891 Unspecified atrial fibrillation: Secondary | ICD-10-CM | POA: Insufficient documentation

## 2022-01-25 DIAGNOSIS — K219 Gastro-esophageal reflux disease without esophagitis: Secondary | ICD-10-CM | POA: Diagnosis present

## 2022-01-25 DIAGNOSIS — E876 Hypokalemia: Secondary | ICD-10-CM | POA: Diagnosis not present

## 2022-01-25 DIAGNOSIS — I4819 Other persistent atrial fibrillation: Secondary | ICD-10-CM | POA: Diagnosis present

## 2022-01-25 LAB — C DIFFICILE QUICK SCREEN W PCR REFLEX
C Diff antigen: NEGATIVE
C Diff interpretation: NOT DETECTED
C Diff toxin: NEGATIVE

## 2022-01-25 LAB — COMPREHENSIVE METABOLIC PANEL
ALT: 25 U/L (ref 0–44)
AST: 44 U/L — ABNORMAL HIGH (ref 15–41)
Albumin: 2.4 g/dL — ABNORMAL LOW (ref 3.5–5.0)
Alkaline Phosphatase: 93 U/L (ref 38–126)
Anion gap: 11 (ref 5–15)
BUN: 5 mg/dL — ABNORMAL LOW (ref 8–23)
CO2: 22 mmol/L (ref 22–32)
Calcium: 8.4 mg/dL — ABNORMAL LOW (ref 8.9–10.3)
Chloride: 102 mmol/L (ref 98–111)
Creatinine, Ser: 0.64 mg/dL (ref 0.44–1.00)
GFR, Estimated: >60 mL/min (ref >60)
Glucose, Bld: 91 mg/dL (ref 70–99)
Potassium: 3.8 mmol/L (ref 3.5–5.1)
Sodium: 135 mmol/L (ref 135–145)
Total Bilirubin: 0.6 mg/dL (ref 0.3–1.2)
Total Protein: 5.5 g/dL — ABNORMAL LOW (ref 6.5–8.1)

## 2022-01-25 LAB — URINALYSIS, ROUTINE W REFLEX MICROSCOPIC
Bilirubin Urine: NEGATIVE
Glucose, UA: NEGATIVE mg/dL
Ketones, ur: 5 mg/dL — AB
Nitrite: NEGATIVE
Protein, ur: 30 mg/dL — AB
Specific Gravity, Urine: 1.015 (ref 1.005–1.030)
pH: 7 (ref 5.0–8.0)

## 2022-01-25 LAB — CBC WITH DIFFERENTIAL/PLATELET
Abs Immature Granulocytes: 0.01 10*3/uL (ref 0.00–0.07)
Basophils Absolute: 0 10*3/uL (ref 0.0–0.1)
Basophils Relative: 1 %
Eosinophils Absolute: 0.1 10*3/uL (ref 0.0–0.5)
Eosinophils Relative: 1 %
HCT: 44.7 % (ref 36.0–46.0)
Hemoglobin: 15.2 g/dL — ABNORMAL HIGH (ref 12.0–15.0)
Immature Granulocytes: 0 %
Lymphocytes Relative: 36 %
Lymphs Abs: 2.3 10*3/uL (ref 0.7–4.0)
MCH: 31.8 pg (ref 26.0–34.0)
MCHC: 34 g/dL (ref 30.0–36.0)
MCV: 93.5 fL (ref 80.0–100.0)
Monocytes Absolute: 0.5 10*3/uL (ref 0.1–1.0)
Monocytes Relative: 8 %
Neutro Abs: 3.4 10*3/uL (ref 1.7–7.7)
Neutrophils Relative %: 54 %
Platelets: 351 10*3/uL (ref 150–400)
RBC: 4.78 MIL/uL (ref 3.87–5.11)
RDW: 13.4 % (ref 11.5–15.5)
WBC: 6.4 10*3/uL (ref 4.0–10.5)
nRBC: 0 % (ref 0.0–0.2)

## 2022-01-25 LAB — LACTIC ACID, PLASMA
Lactic Acid, Venous: 2.3 mmol/L (ref 0.5–1.9)
Lactic Acid, Venous: 1.1 mmol/L (ref 0.5–1.9)

## 2022-01-25 LAB — LIPASE, BLOOD: Lipase: 30 U/L (ref 11–51)

## 2022-01-25 MED ORDER — MELATONIN 3 MG PO TABS: 3.0000 mg | ORAL_TABLET | Freq: Every day | ORAL | Status: DC

## 2022-01-25 MED ORDER — SODIUM CHLORIDE 0.9 % IV BOLUS
1000.0000 mL | Freq: Once | INTRAVENOUS | Status: AC
  Administered 2022-01-25: 1000 mL via INTRAVENOUS

## 2022-01-25 MED ORDER — ONDANSETRON 4 MG PO TBDP
4.0000 mg | ORAL_TABLET | Freq: Three times a day (TID) | ORAL | Status: DC | PRN
  Administered 2022-01-25 – 2022-01-27 (×4): 4 mg via ORAL
  Filled 2022-01-25 (×5): qty 1

## 2022-01-25 MED ORDER — NORTRIPTYLINE HCL 25 MG PO CAPS: 75.0000 mg | ORAL_CAPSULE | Freq: Every day | ORAL | Status: DC

## 2022-01-25 MED ORDER — ENOXAPARIN SODIUM 40 MG/0.4ML IJ SOSY
40.0000 mg | PREFILLED_SYRINGE | INTRAMUSCULAR | Status: DC
  Administered 2022-01-26: 40 mg via SUBCUTANEOUS
  Filled 2022-01-25: qty 0.4

## 2022-01-25 MED ORDER — NICOTINE 14 MG/24HR TD PT24
14.0000 mg | MEDICATED_PATCH | Freq: Every day | TRANSDERMAL | Status: DC
  Administered 2022-01-25 – 2022-01-29 (×5): 14 mg via TRANSDERMAL
  Filled 2022-01-25 (×5): qty 1

## 2022-01-25 MED ORDER — ONDANSETRON HCL 4 MG/2ML IJ SOLN
4.0000 mg | Freq: Once | INTRAMUSCULAR | Status: AC
  Administered 2022-01-25: 4 mg via INTRAVENOUS
  Filled 2022-01-25: qty 2

## 2022-01-25 MED ORDER — DICYCLOMINE HCL 10 MG PO CAPS
10.0000 mg | ORAL_CAPSULE | Freq: Once | ORAL | Status: AC
  Administered 2022-01-25: 10 mg via ORAL
  Filled 2022-01-25: qty 1

## 2022-01-25 MED ORDER — DILTIAZEM HCL 25 MG/5ML IV SOLN
5.0000 mg | Freq: Once | INTRAVENOUS | Status: AC
  Administered 2022-01-25: 5 mg via INTRAVENOUS
  Filled 2022-01-25: qty 5

## 2022-01-25 MED ORDER — LACTATED RINGERS IV SOLN: INTRAVENOUS | Status: DC

## 2022-01-25 MED ORDER — PANTOPRAZOLE SODIUM 40 MG PO TBEC
40.0000 mg | DELAYED_RELEASE_TABLET | Freq: Every morning | ORAL | Status: DC
  Administered 2022-01-26 – 2022-01-29 (×4): 40 mg via ORAL
  Filled 2022-01-25 (×4): qty 1

## 2022-01-25 MED ORDER — UMECLIDINIUM BROMIDE 62.5 MCG/ACT IN AEPB
1.0000 | INHALATION_SPRAY | Freq: Every day | RESPIRATORY_TRACT | Status: DC
  Administered 2022-01-27 – 2022-01-29 (×3): 1 via RESPIRATORY_TRACT
  Filled 2022-01-25: qty 7

## 2022-01-25 MED ORDER — ALBUTEROL SULFATE HFA 108 (90 BASE) MCG/ACT IN AERS: 2.0000 | INHALATION_SPRAY | Freq: Four times a day (QID) | RESPIRATORY_TRACT | Status: DC | PRN

## 2022-01-25 MED ORDER — MELATONIN 3 MG PO TABS
3.0000 mg | ORAL_TABLET | Freq: Every evening | ORAL | Status: DC | PRN
Start: 2022-01-25 — End: 2022-01-30
  Administered 2022-01-26 – 2022-01-28 (×3): 3 mg via ORAL
  Filled 2022-01-25 (×3): qty 1

## 2022-01-25 MED ORDER — LORAZEPAM 2 MG/ML IJ SOLN
0.5000 mg | Freq: Once | INTRAMUSCULAR | Status: AC
  Administered 2022-01-25: 0.5 mg via INTRAVENOUS
  Filled 2022-01-25: qty 1

## 2022-01-25 MED ORDER — FLUTICASONE FUROATE-VILANTEROL 100-25 MCG/ACT IN AEPB
1.0000 | INHALATION_SPRAY | Freq: Every day | RESPIRATORY_TRACT | Status: DC
  Administered 2022-01-27 – 2022-01-29 (×3): 1 via RESPIRATORY_TRACT
  Filled 2022-01-25 (×2): qty 28

## 2022-01-25 MED ORDER — GABAPENTIN 600 MG PO TABS
600.0000 mg | ORAL_TABLET | Freq: Three times a day (TID) | ORAL | Status: DC
  Administered 2022-01-25 – 2022-01-29 (×11): 600 mg via ORAL
  Filled 2022-01-25 (×12): qty 1

## 2022-01-25 MED ORDER — NORTRIPTYLINE HCL 25 MG PO CAPS
75.0000 mg | ORAL_CAPSULE | Freq: Every day | ORAL | Status: DC
  Administered 2022-01-26 – 2022-01-29 (×4): 75 mg via ORAL
  Filled 2022-01-25 (×5): qty 3

## 2022-01-25 MED ORDER — FOLIC ACID 1 MG PO TABS
1.0000 mg | ORAL_TABLET | Freq: Every day | ORAL | Status: DC
  Administered 2022-01-25 – 2022-01-29 (×4): 1 mg via ORAL
  Filled 2022-01-25 (×5): qty 1

## 2022-01-25 MED ORDER — ALBUTEROL SULFATE (2.5 MG/3ML) 0.083% IN NEBU: 2.5000 mg | INHALATION_SOLUTION | Freq: Four times a day (QID) | RESPIRATORY_TRACT | Status: DC | PRN

## 2022-01-25 MED ORDER — AMLODIPINE BESYLATE 5 MG PO TABS
5.0000 mg | ORAL_TABLET | Freq: Every day | ORAL | Status: DC
  Administered 2022-01-25 – 2022-01-26 (×2): 5 mg via ORAL
  Filled 2022-01-25 (×3): qty 1

## 2022-01-25 MED ORDER — FAMOTIDINE 20 MG PO TABS: 10.0000 mg | ORAL_TABLET | Freq: Every day | ORAL | Status: DC | PRN

## 2022-01-25 NOTE — ED Notes (Signed)
Patient was given a cup of ice. 

## 2022-01-25 NOTE — Assessment & Plan Note (Addendum)
Patient's sister reports of concern regarding severe anxiety, s/p ativan x1 dose in ED with history of chronic opioid use but has since been completely weaned. Stopped buspar last admission due to risk of serotonin syndrome with Zyvox  - Continue nortriptyline home dose - started on hydroxyzine PRN TID

## 2022-01-25 NOTE — Assessment & Plan Note (Addendum)
H/o recent bacteremia with enterococcus avium, blood cx obtained in ED. Sent out on linezolid x2 weeks but unsure if completed - per pharmacy notes, was denied coverage. Also lost to ID f/u. TTE last admission negative for endocarditis. - ID following, appreciate recs - f/u blood cx

## 2022-01-25 NOTE — Assessment & Plan Note (Signed)
Not on chronic anticoagulation per outpatient providers, regular on exam. S/p cardizem x1 dose. Per cardiology, no diltiazem necessary, stop heparin - give metoprolol BID - continuous monitoring

## 2022-01-25 NOTE — Assessment & Plan Note (Signed)
Denies recent use without evidence of withdrawal. Pt states she achieved prolonged sobriety but cannot state for how long - CIWAs  - continue with folate home dose  - MVM

## 2022-01-25 NOTE — ED Notes (Signed)
Pt incontinent of stool; pt cleaned, brief changed

## 2022-01-25 NOTE — Progress Notes (Signed)
FMTS Brief Progress Note  S: Saw patient at bedside with Dr. Nancy Fetter.  Patient reported that she was having worsening heartburn and unable to keep her juice down.  Her last dose of Zofran was at noon today which she reports significantly improved her nausea, and she takes Protonix 40 mg every morning.  She also requested medication to help her sleep.   O: BP (!) 150/107   Pulse (!) 113   Temp 98 F (36.7 C) (Oral)   Resp 17   Ht '5\' 3"'$  (1.6 m)   Wt 59 kg   SpO2 99%   BMI 23.04 kg/m   Cardio: Regular rate and rhythm with tachycardia. Respiratory: Mild wheezing in the right lower quadrant consistent with smoking. Gastrointestinal: Tender to palpation in epigastric region, no rebound tenderness, soft nondistended.   A/P: -Reordered Zofran ODT 4 mg as needed for nausea. -Added Pepcid 10 mg as needed for heartburn -Melatonin 3 mg tablet -Orders reviewed. Labs for AM ordered, which was adjusted as needed.  - If condition changes, please page night team.   Leslie Dales, MD 01/25/2022, 9:08 PM PGY-1, [facility] Night Resident  Please page [phone] with questions.

## 2022-01-25 NOTE — ED Triage Notes (Addendum)
Pt bib ems from home; c/o N/V/D x 2 days; chronic back pain; RR 20, 97% 2L (uses as needed); 98.73F, 132/82, P 112; pt's partner endorses some signs of dementia, but no official diagnosis; a and o x 4 on arrival; rash noted to L flank/buttocks

## 2022-01-25 NOTE — H&P (Signed)
Hospital Admission History and Physical Service Pager: [phone]  Patient name: Karen Casey Medical record number: [id number] Date of Birth: 1956/05/05 Age: 66 y.o. Gender: female  Primary Care Provider: [name], MD Consultants: ID  Code Status: FULL  Preferred Emergency Contact:  Contact Information     Name Relation Home Work Mobile   Karen Casey Significant other [phone]     Karen Casey [place] Daughter [phone]  [phone]       Chief Complaint: Vomiting and diarrhea   Assessment and Plan: Karen Casey is a 66 y.o. female presenting with vomiting and diarrhea. Differential for this patient's presentation of this includes ongoing enterocolitis which is most likely given history and imaging, viral illness which is less likely due to lack of sick contacts or infectious symptoms, C. Diff although tested and negative, no appearance of pancreatic or hepatobiliary etiology on imaging and labs.   COPD (chronic obstructive pulmonary disease) ([place]) Continue with home albuterol PRN and equivalent to Trelegy.   Paroxysmal atrial fibrillation (HCC) Not on chronic anticoagulation per outpatient providers, regular on exam. S/p cardizem x1 dose.  - Continuous monitoring  - consider rate control when indicated   Alcohol use Denies recent use without evidence of withdrawal  - CIWAs  - continue with folate home dose  - MVM   Tachycardia With component of anxiety, would likely benefit from beta blocker. Consider starting if continues.   Dehydration In the setting of acute vomiting and diarrhea, kidney function not affected with no evidence of electrolyte derangement on admission - Continue LR 100 mL/hr (EF 63%) - Zofran PRN (watch Qtc) - Recheck BMP in AM   Bacteremia due to Enterococcus H/o recent bacteremia with enterococcus avium, blood cx obtained in ED. Supposed completion of Zyvox x2 weeks but unsure. TTE last  admission negative for endocarditis  - consult to ID for further assistance  - f/u blood cx   HTN (hypertension) Home amlodipine   Oral candidiasis Noted on last admission but not mentioned during admission. Treated previously with Fluconazole, will recheck tomorrow for persistence and treat if indicated   Anxiety disorder Patient's sister reports of concern regarding severe anxiety, s/p ativan x1 dose in ED with history of chronic opioid use but has since been completely weaned. Stopped buspar last admission due to risk of serotonin syndrome with Zyvox  - Continue nortriptyline home dose   Nausea vomiting and diarrhea Likely secondary to enterocolitis, C. difficile negative without known infectious source - LR 100 MLS/hour (EF 63%) - Imodium - Protonix 40 mg daily  - monitor fluid status        FEN/GI: CLD  VTE Prophylaxis: Lovenox   Disposition: Med Tele   History of Present Illness:  Karen Casey is a 66 y.o. female presenting with vomiting and diarrhea (diarrhea for two days). Last diarrhea episode was two hours ago.   Patient reports of recurrent vomiting that began this morning, no sick contacts, hematochezia, hematemesis.  Patient presented to the emergency room because of severe weakness associated with the vomiting and diarrhea and lack of p.o. intake.  Patient presented to the hospital on 12/2021 with symptoms of diarrhea, CT scan at that time showed enterocolitis.  Patient had recently received antibiotics for UTI then and was tested for C. difficile as well as GI pathogen panel, both were negative.  At that time, she was started on ceftriaxone and metronidazole empirically but only completed 4 days worth.  Over  time during that admission, her diarrhea improved.  This remained improved until 2 days ago when diarrhea started again.  During admission in July 2023, the patient was found to have Enterococcus avium bacteremia in 1 out of 2 bottles for blood cx.  TTE  showed no mention of endocarditis, patient was placed on Zyvox for 2 weeks time.  Patient reports that she completed her Zyvox but did not see infectious disease after discharging from the hospital.  In the ED, the patient was started on LR 100 mL/hour after a bolus NS, was given Zofran, 1 dose of Ativan 0.5 mg, Bentyl and Cardizem 5 mg for rate control. They also obtained blood cultures.   Review Of Systems: Per HPI with the following additions: Presence of vomiting, diarrhea, chronic tingling in feet   Pertinent Past Medical History: GERD Paroxysmal A-fib EtOH abuse Tobacco abuse COPD Anxiety/depression  Remainder reviewed in history tab.   Pertinent Past Surgical History: Past Surgical History:  Procedure Laterality Date   APPENDECTOMY     BIOPSY  01/04/2021   Procedure: BIOPSY;  Surgeon: Yetta Flock, MD;  Location: [facility];  Service: Gastroenterology;;   BIOPSY  03/12/2021   Procedure: BIOPSY;  Surgeon: Irving Copas., MD;  Location: [facility];  Service: Gastroenterology;;   CATARACT EXTRACTION  06/09/2012   Left eye   COLONOSCOPY WITH PROPOFOL N/A 03/12/2021   Procedure: COLONOSCOPY WITH PROPOFOL;  Surgeon: Irving Copas., MD;  Location: [employer] [facility];  Service: Gastroenterology;  Laterality: N/A;   ESOPHAGOGASTRODUODENOSCOPY (EGD) WITH PROPOFOL N/A 01/04/2021   Procedure: ESOPHAGOGASTRODUODENOSCOPY (EGD) WITH PROPOFOL;  Surgeon: Yetta Flock, MD;  Location: [facility];  Service: Gastroenterology;  Laterality: N/A;   left shoulder dislocation  Sept 2011   POLYPECTOMY  03/12/2021   Procedure: POLYPECTOMY;  Surgeon: Mansouraty, Telford Nab., MD;  Location: [facility];  Service: Gastroenterology;;   Angus Seller reviewed in history tab.  Pertinent Social History: Tobacco use: Yes/No/Former Alcohol use: none in 8 months  Other Substance use: None reported  Lives with spouse   Pertinent Family History: N/A  Remainder reviewed in history  tab.   Important Outpatient Medications: Amlodipine, nortriptyline, Protonix Remainder reviewed in medication history.   Objective: BP (!) 136/93   Pulse (!) 104   Temp 99 F (37.2 C) (Oral)   Resp 19   Ht '5\' 3"'$  (1.6 m)   Wt 59 kg   SpO2 97%   BMI 23.04 kg/m  Exam: General: NAD, resting in bed, frail  Eyes: Normal tracking  ENTM: No LAD palpated, normal nares and external ears  Neck: No LAD, no obvious deformities  Cardiovascular: regular rhythm with tachycardia  Respiratory: CTAB with no evidence of increased wob or focal diminishment  Gastrointestinal: diffusely TTP with BS present, no peritoneal signs, soft, nondistended  MSK: Normal ROM  Derm: No rashes or erythema  Neuro: No obvious neurological deficits  Psych: Normal affect and mood   Labs:  CBC BMET  Recent Labs  Lab 01/25/22 1121  WBC 6.4  HGB 15.2*  HCT 44.7  PLT 351   Recent Labs  Lab 01/25/22 1326  NA 135  K 3.8  CL 102  CO2 22  BUN 5*  CREATININE 0.64  GLUCOSE 91  CALCIUM 8.4*     EKG: tachy with PVCs and PACs     Imaging Studies Performed: No new obtained    Erskine Emery, MD 01/25/2022, 6:56 PM PGY-2, [place] Intern pager: [phone], text pages welcome Secure  chat group [facility] Teaching Service

## 2022-01-25 NOTE — ED Notes (Signed)
Pt alert, NAD, respirations even and unlabored

## 2022-01-25 NOTE — ED Notes (Signed)
Pt c/o abdominal cramping; Dr. Laverta Baltimore notified

## 2022-01-25 NOTE — ED Notes (Signed)
Barrier cream applied to rash on buttocks

## 2022-01-25 NOTE — ED Provider Notes (Signed)
**Note De-Identified Karen Obfuscation** Emergency Department Provider Note   I have reviewed the triage vital signs and the nursing notes.   HISTORY  Chief Complaint Nausea, Vomiting, and Diarrhea   HPI Karen Casey is a 66 y.o. female with past history reviewed below including hypertension, COPD, recent admit for acute enterocolitis with diarrhea and concern for sepsis presents to the emergency department with return of diarrhea and generalized weakness.  She is having diffuse abdominal discomfort similar to prior episodes.  On her last admission she was tested for C. difficile and GI panel which were negative.  She initially had leukocytosis and ultimately was found to have enterocolitis on CT.  She completed antibiotics and overall improved to the time of discharge.  Interestingly, she did grow 1 of 2 bottles showing Enterococcus avium bacteremia and completed Zyvox.  She was discharged on 7/27.  She states that due to neuropathy she is not very mobile and lives at home with family.  She has been having multiple episodes of large-volume, watery diarrhea without blood.  She notes that family is helping to clean her after her diarrhea episodes but she has developed some redness to the flank and buttock.  She has not appreciated any significant fevers.  She occasionally feels shortness of breath related to her COPD but nothing severe or worsening.  No dysuria, hesitancy, urgency.   Past Medical History:  Diagnosis Date  . Alcohol abuse    last use 03/09/21, marijuana last 03/09/21  . Allergy   . Anxiety   . Cataract 06/09/2012   Right eye and left eye  . COPD (chronic obstructive pulmonary disease) ([place])   . Depression   . Drug withdrawal seizure with complication ([place]) 7/41/2878   Due to benzodiazepine withdrawal  . Dyspnea    uses oxygen 2L Karen Lytle prn  . GERD (gastroesophageal reflux disease)   . Headache   . Hypertension   . Tory Mckissack term (current) use of anticoagulants   . Neuromuscular disorder ([place])   . Rupture  of appendix 06/09/2012   Event occurred in 2007  . Seizure ([place])    08/2020 per patient  . Seizures ([place])    xanax withdrawl- December 2013  . Urinary incontinence 06/09/2012    Review of Systems  Constitutional: No fever/chills. Positive weakness.  Eyes: No visual changes. ENT: No sore throat. Cardiovascular: Denies chest pain. Respiratory: Denies shortness of breath. Gastrointestinal: Positive abdominal pain. Positive nausea, no vomiting. Positive diarrhea.  No constipation. Genitourinary: Negative for dysuria. Musculoskeletal: Negative for back pain. Skin: Negative for rash. Neurological: Negative for headaches, focal weakness or numbness.   ____________________________________________   PHYSICAL EXAM:  VITAL SIGNS: ED Triage Vitals  Enc Vitals Group     BP 01/25/22 1104 (!) 145/101     Pulse Rate 01/25/22 1059 (!) 115     Resp 01/25/22 1059 (!) 21     Temp 01/25/22 1058 (!) 97.2 F (36.2 C)     Temp Source 01/25/22 1058 Oral     SpO2 01/25/22 1059 100 %     Weight 01/25/22 1056 130 lb 1.1 oz (59 kg)     Height 01/25/22 1056 '5\' 3"'$  (1.6 m)   Constitutional: Alert and oriented. Well appearing and in no acute distress. Eyes: Conjunctivae are normal.  Head: Atraumatic. Nose: No congestion/rhinnorhea. Mouth/Throat: Mucous membranes are moist.   Neck: No stridor.   Cardiovascular: Tachycardia. Good peripheral circulation. Grossly normal heart sounds.   Respiratory: Normal respiratory effort.  No retractions. Lungs CTAB. Gastrointestinal: Soft  with mild diffuse tenderness. No focal tenderness or peritonitis. No distention.  Musculoskeletal: No lower extremity tenderness nor edema. No gross deformities of extremities. Neurologic:  Normal speech and language. No gross focal neurologic deficits are appreciated.  Skin:  Skin is warm, dry and intact. No rash noted.  ____________________________________________   LABS (all labs ordered are listed, but only abnormal  results are displayed)  Labs Reviewed  CBC WITH DIFFERENTIAL/PLATELET - Abnormal; Notable for the following components:      Result Value   Hemoglobin 15.2 (*)    All other components within normal limits  URINE CULTURE  C DIFFICILE QUICK SCREEN W PCR REFLEX    CULTURE, BLOOD (ROUTINE X 2)  CULTURE, BLOOD (ROUTINE X 2)  COMPREHENSIVE METABOLIC PANEL  LIPASE, BLOOD  LACTIC ACID, PLASMA  LACTIC ACID, PLASMA  URINALYSIS, ROUTINE W REFLEX MICROSCOPIC   ____________________________________________  EKG   EKG Interpretation  Date/Time:  Monday January 25 2022 11:31:45 EDT Ventricular Rate:  113 PR Interval:  146 QRS Duration: 74 QT Interval:  358 QTC Calculation: 491 R Axis:   32 Text Interpretation: Sinus tachycardia Multiple premature complexes, vent & supraven Borderline prolonged QT interval Confirmed by Nanda Quinton [phone]) on 01/25/2022 12:59:00 PM        ____________________________________________  RADIOLOGY  No results found.  ____________________________________________   PROCEDURES  Procedure(s) performed:   Procedures   ____________________________________________   INITIAL IMPRESSION / ASSESSMENT AND PLAN / ED COURSE  Pertinent labs & imaging results that were available during my care of the patient were reviewed by me and considered in my medical decision making (see chart for details).   This patient is Presenting for Evaluation of abdominal pain, which does require a range of treatment options, and is a complaint that involves a high risk of morbidity and mortality.  The Differential Diagnoses includes but is not exclusive to acute cholecystitis, intrathoracic causes for epigastric abdominal pain, gastritis, duodenitis, pancreatitis, small bowel or large bowel obstruction, abdominal aortic aneurysm, hernia, gastritis, etc.   Critical Interventions-    Medications  sodium chloride 0.9 % bolus 1,000 mL (1,000 mLs Intravenous New Bag/Given  01/25/22 1133)    Reassessment after intervention:     I *** Additional Historical Information from ***, as the patient is ***.  I decided to review pertinent External Data, and in summary ***.   Clinical Laboratory Tests Ordered, included   Radiologic Tests Ordered, included ***. I independently interpreted the images and agree with radiology interpretation.   Cardiac Monitor Tracing which shows ***   Social Determinants of Health Risk ***  Consult complete with  Medical Decision Making: Summary: ***  Reevaluation with update and discussion with   ***Considered admission***  Disposition:   ____________________________________________  FINAL CLINICAL IMPRESSION(S) / ED DIAGNOSES  Final diagnoses:  None     NEW OUTPATIENT MEDICATIONS STARTED DURING THIS VISIT:  New Prescriptions   No medications on file    Note:  This document was prepared using Dragon voice recognition software and may include unintentional dictation errors.  Nanda Quinton, MD, [facility] Emergency Medicine

## 2022-01-25 NOTE — Assessment & Plan Note (Deleted)
Likely secondary to enterocolitis, C. difficile negative - LR 100 MLS/hour - Imodium

## 2022-01-25 NOTE — ED Notes (Signed)
[name] sister [phone] requesting an update on the patient

## 2022-01-25 NOTE — Assessment & Plan Note (Addendum)
On 8/15 had developed a-fib with, placed on metoprolol. Hx untreated paroxysmal afib, also worsened in the setting of dehydration. Per cardiology, no diltiazem necessary. 8/17 stopped metoprolol, started amiodarone and heparin. Now s/p cardioversion with successful conversion to NSR - start oral amiodarone - resumed apixaban - f/u cards recs

## 2022-01-25 NOTE — Assessment & Plan Note (Signed)
Noted on last admission but not mentioned during admission. Treated previously with Fluconazole, will recheck tomorrow for persistence and treat if indicated

## 2022-01-25 NOTE — Assessment & Plan Note (Addendum)
Improving. D/c on 7/27 for enterocolitis + enterococcus bacteremia, presented this admission for n/v/d. This presentation likely due to recurrence of enterocolitis, inadequate abx treatment. C. difficile negative without known infectious source. Hx GERD. ID consulted. ID ordered stool culture and GI PCR panel, cancelled upon resolution of diarrhea. WBC down from 16.6 to 6.5, no emesis, diarrhea since yesterday morning - admitted to [facility] med-surg, attending Dr. Nori Riis - appreciate ID recs - Loperamide (Imodium) for diarrhea - Pantoprazole (Protonix) 40 mg daily - famotidine (Pepcid) PRN for heartburn - on ondansetron (Zofran) for n/v - monitor fluid status

## 2022-01-25 NOTE — Assessment & Plan Note (Signed)
Continue with home albuterol PRN and equivalent to Trelegy.

## 2022-01-25 NOTE — Assessment & Plan Note (Addendum)
Home amodipine held. Now hypotensive, started on midodrine TID and increased IV fluids from 100 to 150 mL/hr - continue midodrine 5 mg TID

## 2022-01-25 NOTE — Assessment & Plan Note (Deleted)
In the setting of acute vomiting and diarrhea, kidney function not affected - Continue LR 100 mL/hr (EF 63%) - Zofran PRN (watch Qtc) - Recheck BMP in AM

## 2022-01-25 NOTE — ED Notes (Signed)
Pt calling out, states "I can't breathe!"; Dr. Laverta Baltimore at bedside; pt repositioned; sats 100% on RA; pt states she needs something for anxiety

## 2022-01-26 ENCOUNTER — Inpatient Hospital Stay (HOSPITAL_COMMUNITY): Payer: Medicare Other

## 2022-01-26 DIAGNOSIS — R197 Diarrhea, unspecified: Secondary | ICD-10-CM | POA: Diagnosis present

## 2022-01-26 DIAGNOSIS — Z820 Family history of epilepsy and other diseases of the nervous system: Secondary | ICD-10-CM | POA: Diagnosis not present

## 2022-01-26 DIAGNOSIS — E46 Unspecified protein-calorie malnutrition: Secondary | ICD-10-CM | POA: Diagnosis present

## 2022-01-26 DIAGNOSIS — Z8249 Family history of ischemic heart disease and other diseases of the circulatory system: Secondary | ICD-10-CM | POA: Diagnosis not present

## 2022-01-26 DIAGNOSIS — K529 Noninfective gastroenteritis and colitis, unspecified: Secondary | ICD-10-CM

## 2022-01-26 DIAGNOSIS — F32A Depression, unspecified: Secondary | ICD-10-CM | POA: Diagnosis present

## 2022-01-26 DIAGNOSIS — G40909 Epilepsy, unspecified, not intractable, without status epilepticus: Secondary | ICD-10-CM | POA: Diagnosis present

## 2022-01-26 DIAGNOSIS — G629 Polyneuropathy, unspecified: Secondary | ICD-10-CM | POA: Diagnosis present

## 2022-01-26 DIAGNOSIS — F419 Anxiety disorder, unspecified: Secondary | ICD-10-CM

## 2022-01-26 DIAGNOSIS — E860 Dehydration: Secondary | ICD-10-CM | POA: Diagnosis present

## 2022-01-26 DIAGNOSIS — Z818 Family history of other mental and behavioral disorders: Secondary | ICD-10-CM | POA: Diagnosis not present

## 2022-01-26 DIAGNOSIS — B370 Candidal stomatitis: Secondary | ICD-10-CM | POA: Diagnosis present

## 2022-01-26 DIAGNOSIS — I4819 Other persistent atrial fibrillation: Secondary | ICD-10-CM | POA: Diagnosis present

## 2022-01-26 DIAGNOSIS — J449 Chronic obstructive pulmonary disease, unspecified: Secondary | ICD-10-CM | POA: Diagnosis present

## 2022-01-26 DIAGNOSIS — R7881 Bacteremia: Secondary | ICD-10-CM | POA: Diagnosis present

## 2022-01-26 DIAGNOSIS — Z83438 Family history of other disorder of lipoprotein metabolism and other lipidemia: Secondary | ICD-10-CM | POA: Diagnosis not present

## 2022-01-26 DIAGNOSIS — M549 Dorsalgia, unspecified: Secondary | ICD-10-CM | POA: Diagnosis present

## 2022-01-26 DIAGNOSIS — Z9981 Dependence on supplemental oxygen: Secondary | ICD-10-CM | POA: Diagnosis not present

## 2022-01-26 DIAGNOSIS — E876 Hypokalemia: Secondary | ICD-10-CM | POA: Diagnosis not present

## 2022-01-26 DIAGNOSIS — Z79899 Other long term (current) drug therapy: Secondary | ICD-10-CM | POA: Diagnosis not present

## 2022-01-26 DIAGNOSIS — I10 Essential (primary) hypertension: Secondary | ICD-10-CM | POA: Diagnosis present

## 2022-01-26 DIAGNOSIS — R112 Nausea with vomiting, unspecified: Secondary | ICD-10-CM

## 2022-01-26 DIAGNOSIS — I959 Hypotension, unspecified: Secondary | ICD-10-CM | POA: Diagnosis not present

## 2022-01-26 DIAGNOSIS — K219 Gastro-esophageal reflux disease without esophagitis: Secondary | ICD-10-CM | POA: Diagnosis present

## 2022-01-26 DIAGNOSIS — B952 Enterococcus as the cause of diseases classified elsewhere: Secondary | ICD-10-CM | POA: Diagnosis present

## 2022-01-26 DIAGNOSIS — G8929 Other chronic pain: Secondary | ICD-10-CM | POA: Diagnosis present

## 2022-01-26 LAB — MAGNESIUM: Magnesium: 1.2 mg/dL — ABNORMAL LOW (ref 1.7–2.4)

## 2022-01-26 LAB — URINE CULTURE: Culture: 30000 — AB

## 2022-01-26 LAB — CBC
HCT: 37.8 % (ref 36.0–46.0)
Hemoglobin: 13.2 g/dL (ref 12.0–15.0)
MCH: 31.8 pg (ref 26.0–34.0)
MCHC: 34.9 g/dL (ref 30.0–36.0)
MCV: 91.1 fL (ref 80.0–100.0)
Platelets: 301 10*3/uL (ref 150–400)
RBC: 4.15 MIL/uL (ref 3.87–5.11)
RDW: 13.4 % (ref 11.5–15.5)
WBC: 14.2 10*3/uL — ABNORMAL HIGH (ref 4.0–10.5)
nRBC: 0 % (ref 0.0–0.2)

## 2022-01-26 LAB — BASIC METABOLIC PANEL
Anion gap: 12 (ref 5–15)
BUN: 6 mg/dL — ABNORMAL LOW (ref 8–23)
CO2: 27 mmol/L (ref 22–32)
Calcium: 8.5 mg/dL — ABNORMAL LOW (ref 8.9–10.3)
Chloride: 98 mmol/L (ref 98–111)
Creatinine, Ser: 0.72 mg/dL (ref 0.44–1.00)
GFR, Estimated: >60 mL/min (ref >60)
Glucose, Bld: 109 mg/dL — ABNORMAL HIGH (ref 70–99)
Potassium: 2.8 mmol/L — ABNORMAL LOW (ref 3.5–5.1)
Sodium: 137 mmol/L (ref 135–145)

## 2022-01-26 LAB — C-REACTIVE PROTEIN: CRP: 0.8 mg/dL (ref <1.0)

## 2022-01-26 LAB — SEDIMENTATION RATE: Sed Rate: 14 mm/hr (ref 0–22)

## 2022-01-26 MED ORDER — IOHEXOL 9 MG/ML PO SOLN
500.0000 mL | ORAL | Status: AC
  Administered 2022-01-26 (×2): 500 mL via ORAL

## 2022-01-26 MED ORDER — SODIUM CHLORIDE 0.9% FLUSH
10.0000 mL | Freq: Two times a day (BID) | INTRAVENOUS | Status: DC
  Administered 2022-01-27 – 2022-01-29 (×5): 10 mL

## 2022-01-26 MED ORDER — MAGNESIUM SULFATE 2 GM/50ML IV SOLN
2.0000 g | Freq: Once | INTRAVENOUS | Status: AC
  Administered 2022-01-26: 2 g via INTRAVENOUS
  Filled 2022-01-26: qty 50

## 2022-01-26 MED ORDER — LACTATED RINGERS IV BOLUS
1000.0000 mL | Freq: Once | INTRAVENOUS | Status: AC
  Administered 2022-01-26: 1000 mL via INTRAVENOUS

## 2022-01-26 MED ORDER — WHITE PETROLATUM EX OINT
TOPICAL_OINTMENT | CUTANEOUS | Status: DC | PRN
  Filled 2022-01-26: qty 28.35

## 2022-01-26 MED ORDER — METOPROLOL TARTRATE 5 MG/5ML IV SOLN
INTRAVENOUS | Status: AC
  Filled 2022-01-26: qty 5

## 2022-01-26 MED ORDER — POTASSIUM CHLORIDE CRYS ER 20 MEQ PO TBCR
40.0000 meq | EXTENDED_RELEASE_TABLET | ORAL | Status: AC
  Administered 2022-01-26 (×2): 40 meq via ORAL
  Filled 2022-01-26 (×2): qty 2

## 2022-01-26 MED ORDER — SODIUM CHLORIDE 0.9% FLUSH: 10.0000 mL | INTRAVENOUS | Status: DC | PRN

## 2022-01-26 MED ORDER — GERHARDT'S BUTT CREAM: TOPICAL_CREAM | Freq: Two times a day (BID) | CUTANEOUS | Status: DC | PRN

## 2022-01-26 MED ORDER — GERHARDT'S BUTT CREAM
TOPICAL_CREAM | Freq: Two times a day (BID) | CUTANEOUS | Status: DC
  Filled 2022-01-26 (×2): qty 1

## 2022-01-26 MED ORDER — LOPERAMIDE HCL 2 MG PO CAPS
2.0000 mg | ORAL_CAPSULE | ORAL | Status: DC | PRN
Start: 2022-01-26 — End: 2022-01-30
  Administered 2022-01-26 – 2022-01-27 (×4): 2 mg via ORAL
  Filled 2022-01-26 (×4): qty 1

## 2022-01-26 MED ORDER — HYDROXYZINE HCL 10 MG PO TABS
10.0000 mg | ORAL_TABLET | Freq: Three times a day (TID) | ORAL | Status: DC | PRN
  Administered 2022-01-26 – 2022-01-29 (×8): 10 mg via ORAL
  Filled 2022-01-26 (×8): qty 1

## 2022-01-26 MED ORDER — IOHEXOL 300 MG/ML  SOLN
100.0000 mL | Freq: Once | INTRAMUSCULAR | Status: AC | PRN
  Administered 2022-01-26: 100 mL via INTRAVENOUS

## 2022-01-26 MED ORDER — ADULT MULTIVITAMIN W/MINERALS CH
1.0000 | ORAL_TABLET | Freq: Every day | ORAL | Status: DC
  Administered 2022-01-27 – 2022-01-29 (×2): 1 via ORAL
  Filled 2022-01-26 (×3): qty 1

## 2022-01-26 MED ORDER — METOPROLOL TARTRATE 5 MG/5ML IV SOLN
5.0000 mg | Freq: Once | INTRAVENOUS | Status: AC
  Administered 2022-01-26: 5 mg via INTRAVENOUS

## 2022-01-26 NOTE — Progress Notes (Signed)
Daily Progress Note Intern Pager: [phone]  Patient name: Karen Casey Medical record number: [id number] Date of birth: 02-02-1956 Age: 66 y.o. Gender: female  Primary Care Provider: [name], MD Consultants: ID Code Status: FULL  Pt Overview and Major Events to Date:  - 8/14 admitted for vomiting, diarrhea. ID consulted for concerns of inadequately treated enterococcus bacteremia  Assessment and Plan: Karen Casey is a 66 y.o. female presenting with vomiting and diarrhea likely secondary to ongoing enterocolitis. Relevant PMHx includes recent history of enterococcus bacteremia treated with linezolid and lost to follow-up with ID, GERD  * Nausea vomiting and diarrhea D/c on 7/27 for enterocolitis + enterococcus bacteremia. This presentation likely due to recurrence of enterocolitis, inadequate abx treatment. C. difficile negative without known infectious source. Hx GERD. ID consulted - admitted to [facility] med-surg, attending Dr. Nori Riis - appreciate ID recs - LR 100 MLS/hour (EF 63%) - Loperamide (Imodium) for diarrhea - Pantoprazole (Protonix) 40 mg daily - famotidine (Pepcid) PRN for heartburn - on ondansetron (Zofran) for n/v - monitor fluid status  Dehydration In the setting of acute vomiting and diarrhea, kidney function not affected - Continue LR 100 mL/hr (EF 63%) - Zofran PRN (watch Qtc) - Recheck BMP in AM   Bacteremia due to Enterococcus H/o recent bacteremia with enterococcus avium, blood cx obtained in ED. Sent out on linezolid x2 weeks but unsure if completed - per pharmacy notes, was denied coverage. Also lost to ID f/u. TTE last admission negative for endocarditis  - consult to ID for further assistance  - f/u blood cx   Tachycardia Likely in the setting of dehydration. Reassess after adequate time for fluid repletion.  Anxiety disorder Patient's sister reports of concern regarding severe anxiety, s/p ativan x1 dose in ED with history of  chronic opioid use but has since been completely weaned. Stopped buspar last admission due to risk of serotonin syndrome with Zyvox  - Continue nortriptyline home dose - started on hydroxyzine PRN TID  HTN (hypertension) Home amlodipine   COPD (chronic obstructive pulmonary disease) ([place]) Continue with home albuterol PRN and equivalent to Trelegy.   Paroxysmal atrial fibrillation (HCC) Not on chronic anticoagulation per outpatient providers, regular on exam. S/p cardizem x1 dose.  - Continuous monitoring  - consider rate control when indicated   Alcohol use Denies recent use without evidence of withdrawal. Pt states she achieved prolonged sobriety but cannot state for how long - CIWAs  - continue with folate home dose  - MVM   Oral candidiasis Noted on last admission but not mentioned during admission. Treated previously with Fluconazole, will recheck tomorrow for persistence and treat if indicated    FEN/GI: pantoprazole, famotidine PRN, omeprazole PRN PPx: enoxaparin Dispo: pending clinical improvement   Subjective:  Patient endorses continued emesis, last time this morning - appears brown "like poop." Also endorsed diarrhea this morning. Denies chest pain, SOB.  Patient asked for Ativan for her anxiety. Offered hydroxyzine PRN  Objective: Temp:  [97.7 F (36.5 C)-99 F (37.2 C)] 97.9 F (36.6 C) (08/15 0859) Pulse Rate:  [94-124] 109 (08/15 0859) Resp:  [12-33] 17 (08/15 0859) BP: (111-166)/(70-110) 139/88 (08/15 0859) SpO2:  [91 %-100 %] 100 % (08/15 0859)  Physical Exam: General:  ill-appearing, in no acute distress HEENT: normocephalic and atraumatic Cardiovascular: regular rate Respiratory: CTAB anteriorly, normal respiratory effort, and on RA Gastrointestinal: non-distended and hyperactive bowel sounds, diffusely tender to palpation, more prominent in upper quadrants Extremities: moving all extremities  spontaneously Neuro: following commands and no focal  neurological deficits   Laboratory: Most recent CBC Lab Results  Component Value Date   WBC 14.2 (H) 01/26/2022   HGB 13.2 01/26/2022   HCT 37.8 01/26/2022   MCV 91.1 01/26/2022   PLT 301 01/26/2022   Most recent BMP    Latest Ref Rng & Units 01/26/2022    3:50 AM  BMP  Glucose 70 - 99 mg/dL 109   BUN 8 - 23 mg/dL 6   Creatinine 0.44 - 1.00 mg/dL 0.72   Sodium 135 - 145 mmol/L 137   Potassium 3.5 - 5.1 mmol/L 2.8   Chloride 98 - 111 mmol/L 98   CO2 22 - 32 mmol/L 27   Calcium 8.9 - 10.3 mg/dL 8.5     Karen Phenes, MD 01/26/2022, 11:20 AM  PGY-1, [place] Intern pager: [phone], text pages welcome Secure chat group [place]

## 2022-01-26 NOTE — Progress Notes (Signed)
[facility] Interim Progress Note  S: Notified by RN patient in A Fib on telemetry with HR 130s-150s. Requested EKG.  Seen at bedside with Dr. Nelda Bucks. Patient reports feeling overall bad and reports feeling palpitations.  O: BP 109/78 (BP Location: Right Arm)   Pulse (!) 103   Temp (!) 97.5 F (36.4 C) (Oral)   Resp 18   Ht '5\' 3"'$  (1.6 m)   Wt 59 kg   SpO2 95%   BMI 23.04 kg/m   General: laying in bed, NAD CV: tachycardic, irregularly irregular Pulm: no respiratory distress  A/P: A Fib with RVR EKG shows A Fib with HR 160. Will order IV metoprolol 5 mg and 1L LR bolus. If not responding, will need to upgrade to progressive and initiate diltiazem gtt.  Zola Button, MD 01/26/2022, 10:52 PM PGY-3, [place] [facility] pager [phone]

## 2022-01-26 NOTE — Evaluation (Signed)
Occupational Therapy Evaluation Patient Details Name: Karen Casey MRN: [id number] DOB: 04/08/1956 Today's Date: 01/26/2022   History of Present Illness 66 y.o. female presenting 8/14 with dehydration, nausea, and diarrhea. Has a fall history but cannot elaborate on it.  Pt denies being able to walk.  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   Clinical Impression   Pt in bed upon therapy arrival and agreeable to speak with OT regarding evaluation. Bed level evaluation completed and chart review performed to complete evaluation. Pt is presenting with decrease BUE strength, decreased activity tolerance and endurance which has been on-going. Pt with frequent hospitalizations this year. Verbalizes inability to walk due to BLE neuropathy. Her husband does provide assistance at home. She would benefit from skilled OT services to increase deficits mentioned although progress will depend on pt's participation which has been self limiting in the recent past. Pt would benefit from discharge to SNF to focus on mentioned deficits prior to returning home. OT will try to work with patient acutely.      Recommendations for follow up therapy are one component of a multi-disciplinary discharge planning process, led by the attending physician.  Recommendations may be updated based on patient status, additional functional criteria and insurance authorization.   Follow Up Recommendations  Skilled nursing-short term rehab (<3 hours/day)    Assistance Recommended at Discharge Intermittent Supervision/Assistance  Patient can return home with the following A lot of help with walking and/or transfers;A lot of help with bathing/dressing/bathroom;Assistance with cooking/housework;Help with stairs or ramp for entrance;Assist for  transportation;Direct supervision/assist for financial management;Direct supervision/assist for medications management    Functional Status Assessment  Patient has had a recent decline in their functional status and demonstrates the ability to make significant improvements in function in a reasonable and predictable amount of time.  Equipment Recommendations  Tub/shower bench       Precautions / Restrictions Precautions Precautions: Fall;Other (comment) Precaution Comments: BLE neuropathy Restrictions Weight Bearing Restrictions: No      Mobility Bed Mobility Overal bed mobility:  (Not tested)     Patient Response: Flat affect  Transfers Overall transfer level:  (Not tested)        Balance Overall balance assessment:  (Not assessed)       ADL either performed or assessed with clinical judgement   ADL Overall ADL's : Needs assistance/impaired     Grooming: Wash/dry hands;Wash/dry face;Oral care;Set up;Bed level   Upper Body Bathing: Minimal assistance;Bed level   Lower Body Bathing: Moderate assistance;Bed level   Upper Body Dressing : Minimal assistance;Bed level   Lower Body Dressing: Moderate assistance;Bed level   Toilet Transfer: BSC/3in1;Moderate assistance Toilet Transfer Details (indicate cue type and reason): if performing lateral scoot/squat pivot Toileting- Clothing Manipulation and Hygiene: Moderate assistance;Sitting/lateral lean         General ADL Comments: Based on pt interview, clinical judgement used for current functional performance during ADL tasks.     Vision Baseline Vision/History: 0 No visual deficits Ability to See in Adequate Light: 0 Adequate Patient Visual Report: No change from baseline Vision Assessment?: No apparent visual deficits            Pertinent Vitals/Pain Pain Assessment Pain Assessment:  (No report of pain during evaluation)     Hand Dominance Right   Extremity/Trunk Assessment Upper Extremity  Assessment Upper Extremity Assessment: Generalized  weakness   Lower Extremity Assessment Lower Extremity Assessment: Defer to PT evaluation       Communication Communication Communication: No difficulties   Cognition Arousal/Alertness: Awake/alert Behavior During Therapy: Flat affect Overall Cognitive Status: No family/caregiver present to determine baseline cognitive functioning       General Comments: Pt did voice not wanting to talk about things she couldn't do (such as donning her shirt) and how she's already depressed. OT verbalized understanding and said we no longer need to discuss topic.                Home Living Family/patient expects to be discharged to:: Private residence Living Arrangements: Spouse/significant other Available Help at Discharge: Family;Available 24 hours/day Type of Home: House Home Access: Ramped entrance     Home Layout: One level     Bathroom Shower/Tub: Teacher, early years/pre: Handicapped height Bathroom Accessibility: Yes How Accessible: Accessible via walker Home Equipment: Conservation officer, nature (2 wheels);BSC/3in1;Wheelchair - manual;Shower seat          Prior Functioning/Environment Prior Level of Function : Needs assist       Physical Assist : Mobility (physical);ADLs (physical)   ADLs (physical): Bathing;IADLs Mobility Comments: Pt reports using her w/c for mobility. Did not walk due to her BLE neuropathy ADLs Comments: Pt reports that she was unable to transfer into the shower as she could not step over the tub ledge due to her neuropathy. She reports difficulty getting her shirt on although cannot give a reason why.        OT Problem List: Decreased strength;Decreased activity tolerance;Impaired balance (sitting and/or standing);Decreased safety awareness;Decreased knowledge of use of DME or AE;Decreased range of motion      OT Treatment/Interventions: Self-care/ADL training;Therapeutic exercise;Energy  conservation;DME and/or AE instruction;Therapeutic activities;Patient/family education;Balance training;Modalities;Manual therapy;Neuromuscular education    OT Goals(Current goals can be found in the care plan section) Acute Rehab OT Goals Patient Stated Goal: no goals stated OT Goal Formulation: Patient unable to participate in goal setting Time For Goal Achievement: 02/09/22 Potential to Achieve Goals: Fair  OT Frequency: Min 2X/week       AM-PAC OT "6 Clicks" Daily Activity     Outcome Measure Help from another person eating meals?: A Little Help from another person taking care of personal grooming?: A Little Help from another person toileting, which includes using toliet, bedpan, or urinal?: A Lot Help from another person bathing (including washing, rinsing, drying)?: A Little Help from another person to put on and taking off regular upper body clothing?: A Little Help from another person to put on and taking off regular lower body clothing?: A Lot 6 Click Score: 16   End of Session    Activity Tolerance: Other (comment) (self limiting behavior) Patient left: in bed;with call bell/phone within reach  OT Visit Diagnosis: Unsteadiness on feet (R26.81);Muscle weakness (generalized) (M62.81)                Time: 2202-5427 OT Time Calculation (min): 13 min Charges:  OT General Charges $OT Visit: 1 Visit OT Evaluation $OT Eval High Complexity: 1 High  [employer], OTR/L,CBIS  Supplemental OT - [facility] and [facility]   Bowyn Mercier, Clarene Duke 01/26/2022, 4:36 PM

## 2022-01-26 NOTE — Assessment & Plan Note (Addendum)
K 3.0, Mg 1.5. In the setting of acute vomiting and diarrhea, kidney function not affected - Zofran PRN (watch Qtc) - Recheck BMP in AM

## 2022-01-26 NOTE — Evaluation (Signed)
Clinical/Bedside Swallow Evaluation Patient Details  Name: Karen Casey MRN: [id number] Date of Birth: Oct 16, 1955  Today's Date: 01/26/2022 Time: SLP Start Time (ACUTE ONLY): 42 SLP Stop Time (ACUTE ONLY): 1120 SLP Time Calculation (min) (ACUTE ONLY): 15 min  Past Medical History:  Past Medical History:  Diagnosis Date   Alcohol abuse    last use 03/09/21, marijuana last 03/09/21   Allergy    Anxiety    Cataract 06/09/2012   Right eye and left eye   COPD (chronic obstructive pulmonary disease) ([place])    Depression    Drug withdrawal seizure with complication ([place]) 11/09/4130   Due to benzodiazepine withdrawal   Dyspnea    uses oxygen 2L via [place] prn   GERD (gastroesophageal reflux disease)    Headache    Hypertension    Long term (current) use of anticoagulants    Neuromuscular disorder ([place])    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   Seizure ([place])    08/2020 per patient   Seizures ([place])    xanax withdrawl- December 2013   Urinary incontinence 06/09/2012   Past Surgical History:  Past Surgical History:  Procedure Laterality Date   APPENDECTOMY     BIOPSY  01/04/2021   Procedure: BIOPSY;  Surgeon: Yetta Flock, MD;  Location: [facility];  Service: Gastroenterology;;   BIOPSY  03/12/2021   Procedure: BIOPSY;  Surgeon: Irving Copas., MD;  Location: [facility];  Service: Gastroenterology;;   CATARACT EXTRACTION  06/09/2012   Left eye   COLONOSCOPY WITH PROPOFOL N/A 03/12/2021   Procedure: COLONOSCOPY WITH PROPOFOL;  Surgeon: Irving Copas., MD;  Location: [facility];  Service: Gastroenterology;  Laterality: N/A;   ESOPHAGOGASTRODUODENOSCOPY (EGD) WITH PROPOFOL N/A 01/04/2021   Procedure: ESOPHAGOGASTRODUODENOSCOPY (EGD) WITH PROPOFOL;  Surgeon: Yetta Flock, MD;  Location: [facility];  Service: Gastroenterology;  Laterality: N/A;   left shoulder dislocation  Sept 2011   POLYPECTOMY  03/12/2021   Procedure:  POLYPECTOMY;  Surgeon: Mansouraty, Telford Nab., MD;  Location: [facility] ENDOSCOPY;  Service: Gastroenterology;;   HPI:  Patient is a 66 y.o. female with PMH: COPD, HTN, anxiety, alcohol use, tobacco use, GERD. She was recently admitted in June of 2023 after being found down at home and then readmitted in July 2023 following 3 day h/o diarrhea, nausea and vomiting with no relief rom PPI medications.    Assessment / Plan / Recommendation  Clinical Impression  Patient not presenting with overt s/s of aspiration or penetration as per this bedside/clinical swallow evaluation, however it was limited in scope as patient is currently on clear liquids and in addition, she did not consume more than 3 sips of water. She reports that her throat is irritated "from throwing up" and did say "I stuck my finger down there". Patient does have h/o pharyngeal and esophageal phase dysphagia and MBS completed in April of 2022 showed penetration but no aspiration with thin liquids. When patient was here in hospital during admission last month, she was upgraded from soft solids to regular solids prior to discharge. Suspect that patient's current dysphagia is primarily related to her h/o GERD but SLP plans to follow briefly to ensure toleration with current diet as well as when she has been cleared to upgrade to some solid textures.      Aspiration Risk  Mild aspiration risk    Diet Recommendation Thin liquid;Dysphagia 3 (Mech soft)   Liquid Administration via: Cup;Straw Medication Administration: Whole meds with liquid Supervision:  Patient able to self feed Compensations: Minimize environmental distractions;Slow rate;Small sips/bites Postural Changes: Seated upright at 90 degrees;Remain upright for at least 30 minutes after po intake    Other  Recommendations Oral Care Recommendations: Oral care BID    Recommendations for follow up therapy are one component of a multi-disciplinary discharge planning process, led by the  attending physician.  Recommendations may be updated based on patient status, additional functional criteria and insurance authorization.  Follow up Recommendations Other (comment) (TBD)      Assistance Recommended at Discharge Frequent or constant Supervision/Assistance  Functional Status Assessment Patient has had a recent decline in their functional status and demonstrates the ability to make significant improvements in function in a reasonable and predictable amount of time.  Frequency and Duration min 2x/week  1 week       Prognosis Prognosis for Safe Diet Advancement: Good Barriers to Reach Goals: Cognitive deficits;Motivation;Time post onset      Swallow Study   General Date of Onset: 01/26/22 HPI: Patient is a 66 y.o. female with PMH: COPD, HTN, anxiety, alcohol use, tobacco use, GERD. She was recently admitted in June of 2023 after being found down at home and then readmitted in July 2023 following 3 day h/o diarrhea, nausea and vomiting with no relief rom PPI medications. Type of Study: Bedside Swallow Evaluation Previous Swallow Assessment: during previous admissions (June 2023 and April 2022, June 2023, July 2023) Diet Prior to this Study: Thin liquids Temperature Spikes Noted: No Respiratory Status: Room air History of Recent Intubation: No Behavior/Cognition: Alert;Cooperative Oral Cavity Assessment: Within Functional Limits Oral Care Completed by SLP: No Oral Cavity - Dentition: Poor condition Vision: Functional for self-feeding Self-Feeding Abilities: Able to feed self Patient Positioning: Partially reclined Baseline Vocal Quality: Normal Volitional Cough: Strong Volitional Swallow: Able to elicit    Oral/Motor/Sensory Function Overall Oral Motor/Sensory Function: Other (comment) (patient c/o irritated throat)   Ice Chips     Thin Liquid Thin Liquid: Within functional limits Presentation: Straw;Self Fed Pharyngeal  Phase Impairments: Other (comments) Other  Comments: no overt s/s    Nectar Thick Nectar Thick Liquid: Not tested   Honey Thick Honey Thick Liquid: Not tested   Puree Puree: Not tested   Solid     Solid: Not tested      Sonia Baller, MA, CCC-SLP Speech Therapy

## 2022-01-26 NOTE — Progress Notes (Signed)

## 2022-01-26 NOTE — Progress Notes (Signed)
PT Cancellation Note  Patient Details Name: Karen Casey MRN: [id number] DOB: Dec 31, 1955   Cancelled Treatment:    Reason Eval/Treat Not Completed: Other (comment).  Pt is with Speech therapy and will retry as time and pt allow.   Ramond Dial 01/26/2022, 11:12 AM  Mee Hives, PT PhD Acute Rehab Dept. Number: [place]

## 2022-01-26 NOTE — Progress Notes (Signed)
     Brief Progress Note Intern Pager: [phone]  Patient name: Karen Casey Medical record number: [id number] Date of birth: 31-Jul-1955 Age: 66 y.o. Gender: female  Per xray abdomen (8/15) impression:  Findings compatible with small bowel obstruction versus small bowel ileus. No evidence for pneumoperitoneum.  Called surgery consult team for evaluation and management of potential SBO. Per surgery team, SBO is unlikely due to ongoing diarrhea, and dilated small bowel loops on imaging likely secondary to enterocolitis.  Surgery recommended CT abdomen to assess for SBO more accurately. CT abdomen w/ contrast ordered.

## 2022-01-26 NOTE — Consult Note (Addendum)
[facility]                                                                                        Patient Identification: Patient Name: Karen Casey MRN: [id number] [place] Date: 01/25/2022 10:50 AM Today's Date: 01/26/2022 Reason for consult: gastroenteritis  Requesting provider: Dorcas Mcmurray   Active Problems:   Nausea vomiting and diarrhea   Alcohol use   Anxiety disorder   Oral candidiasis   HTN (hypertension)   Paroxysmal atrial fibrillation (HCC)   COPD (chronic obstructive pulmonary disease) ([place])   Bacteremia due to Enterococcus   Dehydration   Tachycardia   Antibiotics:  None   Lines/Hardware:  Assessment 66 Y O Female with PMH of alcohol/tobacco abuse, COPD, Anxiety/Depression, GERD, HTN, Seizure d/o, PAF, Peripheral neuropathy?Neuromuscular disease/ambulatory dysfunction, recent hospital admission for acute enterocolitis and E avium in blood cultures admitted with acute gastroenteritis. Diarrhea seems to have been improving with decreasing frequency. Of note, recently completed  ? Po augmentin for E avium bacteremia   Recommendations  Continue supprotive care for now with IVF and repletion of electrolytes  Abdomen Xray given elevated WBC Fu blood cultures Will hold off on empiric abtx and GI panel for now as diarrhea seems to be improving  Following  Rest of the management as per the primary team. Please call with questions or concerns.  Thank you for the consult  Rosiland Oz, MD Infectious Disease Physician [facility] [address] [place], [place] [address] Phone: [phone]  Fax: [phone]  __________________________________________________________________________________________________________ HPI and Hospital Course: 43 Y O Female with PMH of alcohol/tobacco abuse, COPD, Anxiety/Depression,  GERD, HTN, Seizure d/o, PAF, Peripheral neuropathy/Neuromuscular disease/ambulatory dysfunction who was brought in by EMS from home for nausea, vomiting and diarrhea for 2 days.At ED,  Chronic back pain +. Rash noted on left flank and buttock.  Recently admitted in July 22-27 2023 for diarrhea with enterocolitis seen in CT. Initially treated for UTI with antibiotics. 7/.22 C diff and GI Panel negative. Also noted to have E avium in 1/2 sets 7/27 , TTE negative for endocarditi. Seen by ID and discharged on Linezolid for 13 days.   Started having nausea, vomiting and loose stool since Saturday. She has not been able to eat since Friday. Vomiting was non bilous and non bloody until this morning when she also had minimal dark red blood in vomitus ( likely due to persistent vomiting). She had numerous watery stool on Sunday, 10 episodes on Monday and 6 so far today. Also has abdominal pain. Denies fevers, chills and sweats. Denies eating outside or unusual food, recent travel and sick contact. She tells me she has on and off diarrhea and was seen in the hospital for 22 times last year. She used to get abtx and used to get discharged. She tells me she could not afford the antibiotics prescribed last admission and took some another antibiotic (Augmemtin ) to complete 2 weeks course.   She tells me she had bad neuropathy and has not been able to walk for last 4 months  ROS: all systems reviewed with pertinent positives and negatives as listed above  Past Medical History:  Diagnosis Date   Alcohol abuse    last use 03/09/21, marijuana last 03/09/21   Allergy    Anxiety    Cataract 06/09/2012   Right eye and left eye   COPD (chronic obstructive pulmonary disease) ([place])    Depression    Drug withdrawal seizure with complication ([place]) 2/56/3893   Due to benzodiazepine withdrawal   Dyspnea    uses oxygen 2L via [place] prn   GERD (gastroesophageal reflux disease)    Headache    Hypertension    Long term  (current) use of anticoagulants    Neuromuscular disorder ([place])    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   Seizure ([place])    08/2020 per patient   Seizures ([place])    xanax withdrawl- December 2013   Urinary incontinence 06/09/2012   Past Surgical History:  Procedure Laterality Date   APPENDECTOMY     BIOPSY  01/04/2021   Procedure: BIOPSY;  Surgeon: Yetta Flock, MD;  Location: [facility];  Service: Gastroenterology;;   BIOPSY  03/12/2021   Procedure: BIOPSY;  Surgeon: Irving Copas., MD;  Location: [facility];  Service: Gastroenterology;;   CATARACT EXTRACTION  06/09/2012   Left eye   COLONOSCOPY WITH PROPOFOL N/A 03/12/2021   Procedure: COLONOSCOPY WITH PROPOFOL;  Surgeon: Irving Copas., MD;  Location: [facility];  Service: Gastroenterology;  Laterality: N/A;   ESOPHAGOGASTRODUODENOSCOPY (EGD) WITH PROPOFOL N/A 01/04/2021   Procedure: ESOPHAGOGASTRODUODENOSCOPY (EGD) WITH PROPOFOL;  Surgeon: Yetta Flock, MD;  Location: [facility];  Service: Gastroenterology;  Laterality: N/A;   left shoulder dislocation  Sept 2011   POLYPECTOMY  03/12/2021   Procedure: POLYPECTOMY;  Surgeon: Mansouraty, Telford Nab., MD;  Location: [facility];  Service: Gastroenterology;;     Scheduled Meds:  amLODipine  5 mg Oral Daily   enoxaparin (LOVENOX) injection  40 mg Subcutaneous Q24H   fluticasone furoate-vilanterol  1 puff Inhalation Daily   And   umeclidinium bromide  1 puff Inhalation Daily   folic acid  1 mg Oral Daily   gabapentin  600 mg Oral TID   nicotine  14 mg Transdermal Daily   nortriptyline  75 mg Oral QHS   pantoprazole  40 mg Oral q morning   potassium chloride  40 mEq Oral Q4H   Continuous Infusions:  lactated ringers 100 mL/hr at 01/26/22 0059   PRN Meds:.albuterol, famotidine, melatonin, ondansetron  Allergies  Allergen Reactions   Capsaicin-Menthol Other (See Comments)    Burning and peeling on area applied to    Diclo Gel [Diclofenac Sodium] Other (See Comments)    GEL AND CREAM-burning and peeling at the sight of application.   Social History   Socioeconomic History   Marital status: Single    Spouse name: Not on file   Number of children: 1   Years of education: Not on file   Highest education level: Not on file  Occupational History   Not on file  Tobacco Use   Smoking status: Every Day    Packs/day: 0.50    Years: 43.00    Total pack years: 21.50    Types: Cigarettes   Smokeless tobacco: Never   Tobacco comments:    Tobacco info given  Vaping Use   Vaping Use: Never used  Substance and Sexual Activity   Alcohol use: Yes    Alcohol/week: 3.0 standard drinks of  alcohol    Types: 3 Cans of beer per week    Comment: 1-2 times week just beer   Drug use: Yes    Types: Marijuana, Other-see comments    Comment: Past hx of benzo abuse--xanax   Sexual activity: Not on file  Other Topics Concern   Not on file  Social History Narrative   Not on file   Social Determinants of Health   Financial Resource Strain: Not on file  Food Insecurity: No Food Insecurity (12/25/2021)   Hunger Vital Sign    Worried About Running Out of Food in the Last Year: Never true    Ran Out of Food in the Last Year: Never true  Transportation Needs: Not on file  Physical Activity: Not on file  Stress: Not on file  Social Connections: Not on file  Intimate Partner Violence: Not on file   Family History  Problem Relation Age of Onset   Hypertension Mother    Hyperlipidemia Mother    Aneurysm Mother        Rupture - Cause of death   Heart disease Father        MI - cause of death   Depression Father    Parkinsonism Father    Hypertension Sister    Colon cancer Neg Hx    Esophageal cancer Neg Hx    Rectal cancer Neg Hx    Stomach cancer Neg Hx     Vitals BP 139/88 (BP Location: Right Arm)   Pulse (!) 109   Temp 97.9 F (36.6 C) (Oral)   Resp 17   Ht '5\' 3"'$  (1.6 m)   Wt 59 kg   SpO2 100%    BMI 23.04 kg/m    Physical Exam Constitutional:  Elderly caucasian female lying in the bed and appear dehydrated     Comments: some sores in the left angle of mouth, missing teeth and caries   Cardiovascular:     Rate and Rhythm: Normal rate and Irregular rhythm.     Heart sounds:   Pulmonary:     Effort: Pulmonary effort is normal room air     Comments: Normal breath sounds   Abdominal:     Palpations: Abdomen is soft.     Tenderness: Non tender and non distended, BS slightly increased   Musculoskeletal:        General: No swelling or tenderness in peripheral joints. Minimal perianal erythema noted   Neurological:     General: awake, alert and oriented, no gross neurologic deficits   Psychiatric:        Mood and Affect: Mood normal.    Pertinent Microbiology Results for orders placed or performed during the hospital encounter of 01/25/22  C Difficile Quick Screen w PCR reflex     Status: None   Collection Time: 01/25/22 11:27 AM   Specimen: STOOL  Result Value Ref Range Status   C Diff antigen NEGATIVE NEGATIVE Final   C Diff toxin NEGATIVE NEGATIVE Final   C Diff interpretation No C. difficile detected.  Final    Comment: Performed at [facility], [place] [address], [place], [place] [address]  Culture, blood (routine x 2)     Status: None (Preliminary result)   Collection Time: 01/25/22 11:29 AM   Specimen: BLOOD RIGHT HAND  Result Value Ref Range Status   Specimen Description BLOOD RIGHT HAND  Final   Special Requests   Final    BOTTLES DRAWN AEROBIC AND ANAEROBIC Blood Culture  results may not be optimal due to an inadequate volume of blood received in culture bottles   Culture   Final    NO GROWTH < 24 HOURS Performed at [facility] [address], [place], [place] [address]    Report Status PENDING  Incomplete  Culture, blood (routine x 2)     Status: None (Preliminary result)   Collection Time: 01/25/22 12:13 PM   Specimen: BLOOD  Result  Value Ref Range Status   Specimen Description BLOOD SITE NOT SPECIFIED  Final   Special Requests   Final    BOTTLES DRAWN AEROBIC ONLY Blood Culture results may not be optimal due to an inadequate volume of blood received in culture bottles   Culture   Final    NO GROWTH < 24 HOURS Performed at [facility], [place] [address], [place], [place] [address]    Report Status PENDING  Incomplete    Pertinent Lab seen by me:    Latest Ref Rng & Units 01/26/2022    3:50 AM 01/25/2022   11:21 AM 01/06/2022   11:04 AM  CBC  WBC 4.0 - 10.5 K/uL 14.2  6.4  7.9   Hemoglobin 12.0 - 15.0 g/dL 13.2  15.2  12.2   Hematocrit 36.0 - 46.0 % 37.8  44.7  37.9   Platelets 150 - 400 K/uL 301  351  163       Latest Ref Rng & Units 01/26/2022    3:50 AM 01/25/2022    1:26 PM 01/07/2022    5:29 AM  CMP  Glucose 70 - 99 mg/dL 109  91  89   BUN 8 - 23 mg/dL 6  5  <5   Creatinine 0.44 - 1.00 mg/dL 0.72  0.64  0.61   Sodium 135 - 145 mmol/L 137  135  134   Potassium 3.5 - 5.1 mmol/L 2.8  3.8  3.8   Chloride 98 - 111 mmol/L 98  102  107   CO2 22 - 32 mmol/L '27  22  21   '$ Calcium 8.9 - 10.3 mg/dL 8.5  8.4  7.9   Total Protein 6.5 - 8.1 g/dL  5.5    Total Bilirubin 0.3 - 1.2 mg/dL  0.6    Alkaline Phos 38 - 126 U/L  93    AST 15 - 41 U/L  44    ALT 0 - 44 U/L  25       Pertinent Imagings/Other Imagings Plain films and CT images have been personally visualized and interpreted; radiology reports have been reviewed. Decision making incorporated into the Impression / Recommendations.  ECHOCARDIOGRAM COMPLETE  Result Date: 01/06/2022    ECHOCARDIOGRAM REPORT   Patient Name:   ELAIN WIXON Date of Exam: 01/06/2022 Medical Rec #:  [id number]        Height:       63.0 in Accession #:    [id number]       Weight:       131.8 lb Date of Birth:  Dec 12, 1955       BSA:          1.620 m Patient Age:    24 years         BP:           140/100 mmHg Patient Gender: F                HR:           100  bpm. Exam Location:   Inpatient Procedure: 2D Echo, 3D Echo, Cardiac Doppler and Color Doppler Indications:    Bacteremia  History:        Patient has prior history of Echocardiogram examinations, most                 recent 11/23/2021. Abnormal ECG, Arrythmias:Atrial Fibrillation,                 Signs/Symptoms:Altered Mental Status, Hypotension and Dyspnea;                 Risk Factors:Hypertension and Current Smoker. ETOH.                 Benzodiazapine withdrawal.  Sonographer:    Roseanna Rainbow RDCS Referring Phys: [place]  1. Left ventricular ejection fraction by 3D volume is 63 %. The left ventricle has normal function. The left ventricle has no regional wall motion abnormalities. Left ventricular diastolic parameters are indeterminate.  2. Right ventricular systolic function is normal. The right ventricular size is normal. There is normal pulmonary artery systolic pressure. The estimated right ventricular systolic pressure is 63.8 mmHg.  3. Left atrial size was mildly dilated.  4. The mitral valve is normal in structure. Mild mitral valve regurgitation. No evidence of mitral stenosis.  5. The aortic valve is tricuspid. There is mild calcification of the aortic valve. Aortic valve regurgitation is not visualized. No aortic stenosis is present.  6. The inferior vena cava is normal in size with greater than 50% respiratory variability, suggesting right atrial pressure of 3 mmHg.  7. Rhythm strip during this exam demonstrates premature atrial contractions. FINDINGS  Left Ventricle: Left ventricular ejection fraction by 3D volume is 63 %. The left ventricle has normal function. The left ventricle has no regional wall motion abnormalities. The left ventricular internal cavity size was normal in size. There is no left  ventricular hypertrophy. Left ventricular diastolic parameters are indeterminate. Right Ventricle: The right ventricular size is normal. No increase in right ventricular wall thickness. Right  ventricular systolic function is normal. There is normal pulmonary artery systolic pressure. The tricuspid regurgitant velocity is 2.38 m/s, and  with an assumed right atrial pressure of 3 mmHg, the estimated right ventricular systolic pressure is 75.6 mmHg. Left Atrium: Left atrial size was mildly dilated. Right Atrium: Right atrial size was normal in size. Pericardium: There is no evidence of pericardial effusion. Mitral Valve: The mitral valve is normal in structure. Mild mitral valve regurgitation. No evidence of mitral valve stenosis. Tricuspid Valve: The tricuspid valve is normal in structure. Tricuspid valve regurgitation is trivial. No evidence of tricuspid stenosis. Aortic Valve: The aortic valve is tricuspid. There is mild calcification of the aortic valve. Aortic valve regurgitation is not visualized. No aortic stenosis is present. Pulmonic Valve: The pulmonic valve was normal in structure. Pulmonic valve regurgitation is trivial. No evidence of pulmonic stenosis. Aorta: The aortic root is normal in size and structure. Venous: The inferior vena cava is normal in size with greater than 50% respiratory variability, suggesting right atrial pressure of 3 mmHg. IAS/Shunts: No atrial level shunt detected by color flow Doppler. EKG: Rhythm strip during this exam demonstrates premature atrial contractions.  LEFT VENTRICLE PLAX 2D LVIDd:         4.00 cm         Diastology LVIDs:         2.80 cm         LV  e' medial:    7.62 cm/s LV PW:         1.00 cm         LV E/e' medial:  10.6 LV IVS:        0.90 cm         LV e' lateral:   9.46 cm/s LVOT diam:     2.10 cm         LV E/e' lateral: 8.5 LV SV:         62 LV SV Index:   38 LVOT Area:     3.46 cm        3D Volume EF                                LV 3D EF:    Left                                             ventricul LV Volumes (MOD)                            ar LV vol d, MOD    75.7 ml                    ejection A2C:                                         fraction LV vol d, MOD    84.6 ml                    by 3D A4C:                                        volume is LV vol s, MOD    29.6 ml                    63 %. A2C: LV vol s, MOD    36.0 ml A4C:                           3D Volume EF: LV SV MOD A2C:   46.1 ml       3D EF:        63 % LV SV MOD A4C:   84.6 ml       LV EDV:       140 ml LV SV MOD BP:    49.1 ml       LV ESV:       52 ml                                LV SV:        87 ml RIGHT VENTRICLE             IVC RV S prime:     13.50 cm/s  IVC diam: 1.80 cm TAPSE (M-mode): 2.0 cm LEFT ATRIUM  Index        RIGHT ATRIUM           Index LA diam:        3.10 cm 1.91 cm/m   RA Area:     10.20 cm LA Vol (A2C):   63.8 ml 39.39 ml/m  RA Volume:   20.60 ml  12.72 ml/m LA Vol (A4C):   42.4 ml 26.18 ml/m LA Biplane Vol: 55.8 ml 34.45 ml/m  AORTIC VALVE             PULMONIC VALVE LVOT Vmax:   103.35 cm/s PR End Diast Vel: 1.29 msec LVOT Vmean:  71.000 cm/s LVOT VTI:    0.180 m  AORTA Ao Root diam: 3.50 cm Ao Asc diam:  3.30 cm MITRAL VALVE               TRICUSPID VALVE MV Area (PHT): 4.79 cm    TR Peak grad:   22.7 mmHg MV Decel Time: 158 msec    TR Vmax:        238.00 cm/s MV E velocity: 80.62 cm/s MV A velocity: 87.55 cm/s  SHUNTS MV E/A ratio:  0.92        Systemic VTI:  0.18 m                            Systemic Diam: 2.10 cm Cherlynn Kaiser MD Electronically signed by Cherlynn Kaiser MD Signature Date/Time: 01/06/2022/8:21:23 PM    Final    DG CHEST PORT 1 VIEW  Result Date: 01/02/2022 CLINICAL DATA:  Sepsis.  Complains of nausea, vomiting and diarrhea. EXAM: PORTABLE CHEST 1 VIEW COMPARISON:  12/11/2021 FINDINGS: Heart size and mediastinal contours are unremarkable. There is no pleural effusion or edema. No airspace opacities identified. The visualized osseous structures are unremarkable. IMPRESSION: No active disease. Electronically Signed   By: Kerby Moors M.D.   On: 01/02/2022 14:15   CT ABDOMEN PELVIS W CONTRAST  Result Date:  01/02/2022 CLINICAL DATA:  Abdominal pain, nausea, vomiting, diarrhea EXAM: CT ABDOMEN AND PELVIS WITH CONTRAST TECHNIQUE: Multidetector CT imaging of the abdomen and pelvis was performed using the standard protocol following bolus administration of intravenous contrast. RADIATION DOSE REDUCTION: This exam was performed according to the departmental dose-optimization program which includes automated exposure control, adjustment of the mA and/or kV according to patient size and/or use of iterative reconstruction technique. CONTRAST:  157m OMNIPAQUE IOHEXOL 300 MG/ML  SOLN COMPARISON:  12/11/2021 FINDINGS: Lower chest: Small linear densities in the lingula suggest scarring or subsegmental atelectasis. Hepatobiliary: There is marked fatty infiltration of the liver. In image 20 of series 2, there is a 8 mm low-density, possibly a cyst. There is no dilation of bile ducts. Gallbladder is unremarkable. Pancreas: Pancreas appears smaller than usual in size. No focal abnormalities are seen. Spleen: Unremarkable. Adrenals/Urinary Tract: Adrenals are not enlarged. There is no hydronephrosis. There are no renal or ureteral stones. Urinary bladder is not distended. There is possible subcentimeter cyst in the upper pole of left kidney. Stomach/Bowel: Small hiatal hernia is seen. Stomach is unremarkable. There is mild dilation of small bowel loops. There is fluid in the lumen of small bowel loops. Appendix is not seen. There is mild diffuse wall thickening in ascending colon. There is incomplete distention of ascending colon. Liquid stool is seen in colon. Vascular/Lymphatic: Scattered atherosclerotic plaques and calcifications are seen. Reproductive: Unremarkable. Other: Trace amount of free fluid is seen in pelvis.  There is no pneumoperitoneum. Musculoskeletal: Last lumbar vertebra is transitional. Degenerative changes are noted at the L4-L5 level with encroachment of neural foramina. IMPRESSION: There is mild dilation of small  bowel loops along with fluid in the lumen. There is wall thickening in ascending colon. There is liquid stool in colon. Findings suggest enterocolitis. There is no evidence of intestinal obstruction or pneumoperitoneum. There is no hydronephrosis. Fatty liver.  Small hiatal hernia. Other findings as described in the body of the report. Electronically Signed   By: Elmer Picker M.D.   On: 01/02/2022 12:20      I spent 100 minutes for this patient encounter including review of prior medical records/discussing diagnostics and treatment plan with the patient/family/coordinate care with primary/other specialits with greater than 50% of time in face to face encounter.   Electronically signed by:   Rosiland Oz, MD Infectious Disease Physician [facility] for Infectious Disease Pager: [phone]

## 2022-01-26 NOTE — Progress Notes (Signed)
Midline placed in left upper arm for access.  Patient has no other sites available for ML or PICC.  Brachial too small for PICC therefore ML placed.  If patient needs more access or current access is compromised, please consider alternate type of central line.

## 2022-01-26 NOTE — Progress Notes (Signed)
   01/26/22 1345  Notify: Charge Nurse/RN  Name of Charge Nurse/RN Notified Remo Lipps RN  Date Charge Nurse/RN Notified 01/26/22  Time Charge Nurse/RN Notified 1345  Notify: Provider  Provider Name/Title Camelia Phenes MD  Date Provider Notified 01/26/22  Time Provider Notified 1345  Method of Notification Page  Notification Reason Change in status  Provider response Evaluate remotely  Date of Provider Response 01/26/22  Time of Provider Response 1350  Document  Patient Outcome Other (Comment) (patient stable md aware of yellow mews)

## 2022-01-26 NOTE — Evaluation (Signed)
Physical Therapy Evaluation Patient Details Name: Karen Casey MRN: [id number] DOB: 17-Feb-1956 Today's Date: 01/26/2022  History of Present Illness  66 y.o. female presenting 8/14 with dehydration, nausea, and diarrhea. Has a fall history but cannot elaborate on it.  Pt denies being able to walk.  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  Clinical Impression  Pt was seen for mobility on center of bed and to test LE strength, but is otherwise unwilling to move.  Pt demonstrates good LE strength, but is reporting being unable to stand or take a step.  Will recommend her to practice at least one transfer, to assess her ability to get to her wheelchair at home.  Pt is otherwise potentially not going to be assessed for sufficient mobility for home, and per previous chart notes is not likely to be able to afford SNF care.  Follow acutely for goals of acute PT as outlined below.         Recommendations for follow up therapy are one component of a multi-disciplinary discharge planning process, led by the attending physician.  Recommendations may be updated based on patient status, additional functional criteria and insurance authorization.  Follow Up Recommendations Skilled nursing-short term rehab (<3 hours/day) Can patient physically be transported by private vehicle: No    Assistance Recommended at Discharge Frequent or constant Supervision/Assistance  Patient can return home with the following  A lot of help with walking and/or transfers;A little help with bathing/dressing/bathroom;Assistance with cooking/housework;Assist for transportation;Help with stairs or ramp for entrance    Equipment Recommendations None recommended by PT  Recommendations for Other Services       Functional Status Assessment Patient has had  a recent decline in their functional status and demonstrates the ability to make significant improvements in function in a reasonable and predictable amount of time.     Precautions / Restrictions Precautions Precautions: Fall Precaution Comments: monitor her vitals on telemetry Restrictions Weight Bearing Restrictions: No Other Position/Activity Restrictions: reports she cannot stand      Mobility  Bed Mobility Overal bed mobility: Modified Independent Bed Mobility: Rolling, Supine to Sit, Sit to Supine           General bed mobility comments: pt pulls up to sit without help and demonstrates movement with no assist on LE's    Transfers                   General transfer comment: declines to try    Ambulation/Gait               General Gait Details: declines to move  Stairs            Wheelchair Mobility    Modified Rankin (Stroke Patients Only)       Balance Overall balance assessment: Needs assistance, History of Falls Sitting-balance support: Feet supported Sitting balance-Leahy Scale: Good         Standing balance comment: pt is not willing to try to stand                             Pertinent Vitals/Pain Pain Assessment Pain Assessment: 0-10 Pain Score: 6  Pain Location: ankles, feet, back Pain Descriptors / Indicators: Guarding, Tender Pain Intervention(s): Limited activity within patient's tolerance, Monitored during session, Repositioned  Home Living Family/patient expects to be discharged to:: Private residence Living Arrangements: Spouse/significant other Available Help at Discharge: Family;Available 24 hours/day Type of Home: House Home Access: Ramped entrance       Home Layout: One level Home Equipment: Conservation officer, nature (2 wheels);BSC/3in1;Wheelchair - manual Additional Comments: Pt is answering some questions but objecting to being bothered    Prior Function Prior Level of Function : Needs  assist;Patient poor historian/Family not available       Physical Assist : Mobility (physical) Mobility (physical): Gait   Mobility Comments: was on RW at times per pt       Hand Dominance   Dominant Hand: Right    Extremity/Trunk Assessment   Upper Extremity Assessment Upper Extremity Assessment: Overall WFL for tasks assessed    Lower Extremity Assessment Lower Extremity Assessment: Overall WFL for tasks assessed    Cervical / Trunk Assessment Cervical / Trunk Assessment: Other exceptions (chronic back pain)  Communication   Communication: No difficulties  Cognition Arousal/Alertness: Awake/alert Behavior During Therapy: Anxious Overall Cognitive Status: No family/caregiver present to determine baseline cognitive functioning                                 General Comments: pt is unwilling to answer all questions and declines to try to do more than move on the bed.  Has poor control of mood, varies between feeling she is being asked to do too much to being more calm within a short session        General Comments General comments (skin integrity, edema, etc.): Pt is in bed demonstrating movement without help but is declining to try to stand. LE strength is at least 4/5 in all joints and demonstrates fluctuating effort    Exercises     Assessment/Plan    PT Assessment Patient needs continued PT services  PT Problem List Decreased activity tolerance;Decreased mobility       PT Treatment Interventions DME instruction;Gait training;Functional mobility training;Therapeutic activities;Therapeutic exercise;Balance training;Neuromuscular re-education;Patient/family education    PT Goals (Current goals can be found in the Care Plan section)  Acute Rehab PT Goals Patient Stated Goal: to go home again PT Goal Formulation: With patient Time For Goal Achievement: 02/09/22 Potential to Achieve Goals: Good    Frequency Min 3X/week     Co-evaluation                AM-PAC PT "6 Clicks" Mobility  Outcome Measure Help needed turning from your back to your side while in a flat bed without using bedrails?: None Help needed moving from lying on your back to sitting on the side of a flat bed without using bedrails?: None Help needed moving to and from a bed to a chair (including a wheelchair)?: A Lot Help needed standing up from a chair using your arms (e.g., wheelchair or bedside chair)?: A Lot Help needed to walk in hospital room?: A Lot Help needed climbing 3-5 steps with a railing? : Total 6 Click Score: 15    End of Session   Activity Tolerance: Patient tolerated treatment well Patient left: in bed;with call bell/phone within reach Nurse Communication: Mobility status PT Visit Diagnosis: History of falling (Z91.81);Other abnormalities of gait and mobility (R26.89)    Time: 1308-6578 PT Time Calculation (min) (ACUTE ONLY): 13 min   Charges:   PT Evaluation $PT Eval Low Complexity: 1 Low         Rod Holler  Bronson Curb 01/26/2022, 12:39 PM  Mee Hives, PT PhD Acute Rehab Dept. Number: [place] and [place]

## 2022-01-26 NOTE — Progress Notes (Signed)
IV team consult. Assessed both arms with [place]. No appropriate veins for PIV at this time. 2nd assess for another IV team member put in.

## 2022-01-26 NOTE — ED Notes (Signed)
ED TO INPATIENT HANDOFF REPORT  ED Nurse Name and Phone #: Leonia Reeves [phone]  S Name/Age/Gender Karen Casey 66 y.o. female Room/Bed: 037C/037C  Code Status   Code Status: Full Code  Home/SNF/Other Home Patient oriented to: self, place, time, and situation Is this baseline? Yes   Triage Complete: Triage complete  Chief Complaint Diarrhea [R19.7] Dehydration [E86.0]  Triage Note Pt bib ems from home; c/o N/V/D x 2 days; chronic back pain; RR 20, 97% 2L (uses as needed); 98.60F, 132/82, P 112; pt's partner endorses some signs of dementia, but no official diagnosis; a and o x 4 on arrival; rash noted to L flank/buttocks   Allergies Allergies  Allergen Reactions   Capsaicin-Menthol Other (See Comments)    Burning and peeling on area applied to   Diclo Gel [Diclofenac Sodium] Other (See Comments)    GEL AND CREAM-burning and peeling at the sight of application.    Level of Care/Admitting Diagnosis ED Disposition     ED Disposition  Admit   Condition  --   [place]: [place] [[id number]]  Level of Care: Telemetry Medical [104]  May place patient in observation at [facility] or [place] if equivalent level of care is available:: Yes  Covid Evaluation: Asymptomatic - no recent exposure (last 10 days) testing not required  Diagnosis: Dehydration [276.51.ICD-9-CM]  Admitting Physician: Erskine Emery [[id number]]  Attending Physician: Dickie La [[id number]]          B Medical/Surgery History Past Medical History:  Diagnosis Date   Alcohol abuse    last use 03/09/21, marijuana last 03/09/21   Allergy    Anxiety    Cataract 06/09/2012   Right eye and left eye   COPD (chronic obstructive pulmonary disease) ([place])    Depression    Drug withdrawal seizure with complication ([place]) 1/95/0932   Due to benzodiazepine withdrawal   Dyspnea    uses oxygen 2L via [place] prn   GERD (gastroesophageal reflux disease)    Headache    Hypertension     Long term (current) use of anticoagulants    Neuromuscular disorder ([place])    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   Seizure ([place])    08/2020 per patient   Seizures ([place])    xanax withdrawl- December 2013   Urinary incontinence 06/09/2012   Past Surgical History:  Procedure Laterality Date   APPENDECTOMY     BIOPSY  01/04/2021   Procedure: BIOPSY;  Surgeon: Yetta Flock, MD;  Location: [place];  Service: Gastroenterology;;   BIOPSY  03/12/2021   Procedure: BIOPSY;  Surgeon: Irving Copas., MD;  Location: [place];  Service: Gastroenterology;;   CATARACT EXTRACTION  06/09/2012   Left eye   COLONOSCOPY WITH PROPOFOL N/A 03/12/2021   Procedure: COLONOSCOPY WITH PROPOFOL;  Surgeon: Irving Copas., MD;  Location: [place];  Service: Gastroenterology;  Laterality: N/A;   ESOPHAGOGASTRODUODENOSCOPY (EGD) WITH PROPOFOL N/A 01/04/2021   Procedure: ESOPHAGOGASTRODUODENOSCOPY (EGD) WITH PROPOFOL;  Surgeon: Yetta Flock, MD;  Location: [place];  Service: Gastroenterology;  Laterality: N/A;   left shoulder dislocation  Sept 2011   POLYPECTOMY  03/12/2021   Procedure: POLYPECTOMY;  Surgeon: Mansouraty, Telford Nab., MD;  Location: [place];  Service: Gastroenterology;;     A IV Location/Drains/Wounds Patient Lines/Drains/Airways Status     Active Line/Drains/Airways     Name Placement date Placement time Site Days   Peripheral IV 01/25/22 20 G Right  Hand 01/25/22  1121  Hand  1   External Urinary Catheter 01/26/22  0453  --  less than 1   Wound / Incision (Open or Dehisced) 11/28/21 Irritant Dermatitis (Moisture Associated Skin Damage) Groin Bilateral 11/28/21  2000  Groin  59   Wound / Incision (Open or Dehisced) 01/02/22 Irritant Dermatitis (Moisture Associated Skin Damage) Perineum Anterior;Posterior;Medial redness extending on perineum and buttocks 01/02/22  1400  Perineum  24            Intake/Output Last 24  hours No intake or output data in the 24 hours ending 01/26/22 0811  Labs/Imaging Results for orders placed or performed during the hospital encounter of 01/25/22 (from the past 48 hour(s))  CBC with Differential     Status: Abnormal   Collection Time: 01/25/22 11:21 AM  Result Value Ref Range   WBC 6.4 4.0 - 10.5 K/uL   RBC 4.78 3.87 - 5.11 MIL/uL   Hemoglobin 15.2 (H) 12.0 - 15.0 g/dL   HCT 44.7 36.0 - 46.0 %   MCV 93.5 80.0 - 100.0 fL   MCH 31.8 26.0 - 34.0 pg   MCHC 34.0 30.0 - 36.0 g/dL   RDW 13.4 11.5 - 15.5 %   Platelets 351 150 - 400 K/uL   nRBC 0.0 0.0 - 0.2 %   Neutrophils Relative % 54 %   Neutro Abs 3.4 1.7 - 7.7 K/uL   Lymphocytes Relative 36 %   Lymphs Abs 2.3 0.7 - 4.0 K/uL   Monocytes Relative 8 %   Monocytes Absolute 0.5 0.1 - 1.0 K/uL   Eosinophils Relative 1 %   Eosinophils Absolute 0.1 0.0 - 0.5 K/uL   Basophils Relative 1 %   Basophils Absolute 0.0 0.0 - 0.1 K/uL   Immature Granulocytes 0 %   Abs Immature Granulocytes 0.01 0.00 - 0.07 K/uL    Comment: Performed at [facility], [address] [place], [place] [address]  Lactic acid, plasma     Status: Abnormal   Collection Time: 01/25/22 11:25 AM  Result Value Ref Range   Lactic Acid, Venous 2.3 (HH) 0.5 - 1.9 mmol/L    Comment: CRITICAL RESULT CALLED TO, READ BACK BY AND VERIFIED WITH J.Layla Barter RN 1256 01/25/22 MCORMICK K Performed at [facility] [address] [place], [place] [address]   C Difficile Quick Screen w PCR reflex     Status: None   Collection Time: 01/25/22 11:27 AM   Specimen: STOOL  Result Value Ref Range   C Diff antigen NEGATIVE NEGATIVE   C Diff toxin NEGATIVE NEGATIVE   C Diff interpretation No C. difficile detected.     Comment: Performed at [facility], [place] [address], [place], [place] [address]  Lactic acid, plasma     Status: None   Collection Time: 01/25/22  1:26 PM  Result Value Ref Range   Lactic Acid, Venous 1.1 0.5 - 1.9 mmol/L     Comment: Performed at [facility] [address] [place], [place] [address]  Comprehensive metabolic panel     Status: Abnormal   Collection Time: 01/25/22  1:26 PM  Result Value Ref Range   Sodium 135 135 - 145 mmol/L   Potassium 3.8 3.5 - 5.1 mmol/L   Chloride 102 98 - 111 mmol/L   CO2 22 22 - 32 mmol/L   Glucose, Bld 91 70 - 99 mg/dL    Comment: Glucose reference range applies only to samples taken  after fasting for at least 8 hours.   BUN 5 (L) 8 - 23 mg/dL   Creatinine, Ser 0.64 0.44 - 1.00 mg/dL   Calcium 8.4 (L) 8.9 - 10.3 mg/dL   Total Protein 5.5 (L) 6.5 - 8.1 g/dL   Albumin 2.4 (L) 3.5 - 5.0 g/dL   AST 44 (H) 15 - 41 U/L   ALT 25 0 - 44 U/L   Alkaline Phosphatase 93 38 - 126 U/L   Total Bilirubin 0.6 0.3 - 1.2 mg/dL   GFR, Estimated >60 >60 mL/min    Comment: (NOTE) Calculated using the CKD-EPI Creatinine Equation (2021)    Anion gap 11 5 - 15    Comment: Performed at [facility], [place] [address], [place], [place] [address]  Lipase, blood     Status: None   Collection Time: 01/25/22  1:26 PM  Result Value Ref Range   Lipase 30 11 - 51 U/L    Comment: Performed at [facility] [address], [place], [place] [address]  Urinalysis, Routine w reflex microscopic     Status: Abnormal   Collection Time: 01/25/22  1:44 PM  Result Value Ref Range   Color, Urine YELLOW YELLOW   APPearance CLEAR CLEAR   Specific Gravity, Urine 1.015 1.005 - 1.030   pH 7.0 5.0 - 8.0   Glucose, UA NEGATIVE NEGATIVE mg/dL   Hgb urine dipstick SMALL (A) NEGATIVE   Bilirubin Urine NEGATIVE NEGATIVE   Ketones, ur 5 (A) NEGATIVE mg/dL   Protein, ur 30 (A) NEGATIVE mg/dL   Nitrite NEGATIVE NEGATIVE   Leukocytes,Ua SMALL (A) NEGATIVE   RBC / HPF 0-5 0 - 5 RBC/hpf   WBC, UA 11-20 0 - 5 WBC/hpf   Bacteria, UA RARE (A) NONE SEEN   Squamous Epithelial / LPF 0-5 0 - 5   Mucus PRESENT    Non Squamous Epithelial 0-5 (A) NONE SEEN    Comment: Performed at [facility], [place] [address], [place], [place] [address]  CBC     Status: Abnormal   Collection Time: 01/26/22  3:50 AM  Result Value Ref Range   WBC 14.2 (H) 4.0 - 10.5 K/uL   RBC 4.15 3.87 - 5.11 MIL/uL   Hemoglobin 13.2 12.0 - 15.0 g/dL   HCT 37.8 36.0 - 46.0 %   MCV 91.1 80.0 - 100.0 fL   MCH 31.8 26.0 - 34.0 pg   MCHC 34.9 30.0 - 36.0 g/dL   RDW 13.4 11.5 - 15.5 %   Platelets 301 150 - 400 K/uL   nRBC 0.0 0.0 - 0.2 %    Comment: Performed at [facility], [place] [address], [place], [place] [address]  Basic metabolic panel     Status: Abnormal   Collection Time: 01/26/22  3:50 AM  Result Value Ref Range   Sodium 137 135 - 145 mmol/L   Potassium 2.8 (L) 3.5 - 5.1 mmol/L    Comment: DELTA CHECK NOTED   Chloride 98 98 - 111 mmol/L   CO2 27 22 - 32 mmol/L   Glucose, Bld 109 (H) 70 - 99 mg/dL    Comment: Glucose reference range applies only to samples taken after fasting for at least 8 hours.   BUN 6 (L) 8 - 23 mg/dL   Creatinine, Ser 0.72 0.44 - 1.00 mg/dL   Calcium 8.5 (L) 8.9 - 10.3 mg/dL   GFR, Estimated >60 >60 mL/min    Comment: (NOTE) Calculated  using the CKD-EPI Creatinine Equation (2021)    Anion gap 12 5 - 15    Comment: Performed at [facility], [place] [address], [place], [place] [address]  Magnesium     Status: Abnormal   Collection Time: 01/26/22  3:50 AM  Result Value Ref Range   Magnesium 1.2 (L) 1.7 - 2.4 mg/dL    Comment: Performed at [facility] [address] [place], [place] [address]   No results found.  Pending Labs Unresulted Labs (From admission, onward)     Start     Ordered   01/25/22 1129  Culture, blood (routine x 2)  BLOOD CULTURE X 2,   R      01/25/22 1128   01/25/22 1126  Urine Culture  ONCE - URGENT,   URGENT       Question:  Indication  Answer:  Dysuria   01/25/22 1127            Vitals/Pain Today's Vitals   01/26/22 0500 01/26/22 0530 01/26/22 0600 01/26/22 0630  BP: 132/81 135/82 139/88 131/88  Pulse: (!) 110 (!) 111  (!) 112 (!) 114  Resp: (!) 22 (!) 23 (!) 22 15  Temp:      TempSrc:      SpO2: 91% 97% 96% 96%  Weight:      Height:      PainSc:        Isolation Precautions Enteric precautions (UV disinfection)  Medications Medications  lactated ringers infusion ( Intravenous New Bag/Given 01/26/22 0059)  amLODipine (NORVASC) tablet 5 mg (5 mg Oral Given 01/25/22 2259)  pantoprazole (PROTONIX) EC tablet 40 mg (has no administration in time range)  folic acid (FOLVITE) tablet 1 mg (1 mg Oral Given 01/25/22 2259)  gabapentin (NEURONTIN) tablet 600 mg (600 mg Oral Given 01/25/22 2259)  fluticasone furoate-vilanterol (BREO ELLIPTA) 100-25 MCG/ACT 1 puff (has no administration in time range)    And  umeclidinium bromide (INCRUSE ELLIPTA) 62.5 MCG/ACT 1 puff (has no administration in time range)  enoxaparin (LOVENOX) injection 40 mg (has no administration in time range)  albuterol (PROVENTIL) (2.5 MG/3ML) 0.083% nebulizer solution 2.5 mg (has no administration in time range)  nicotine (NICODERM CQ - dosed in mg/24 hours) patch 14 mg (14 mg Transdermal Patch Applied 01/25/22 1934)  nortriptyline (PAMELOR) capsule 75 mg (has no administration in time range)  ondansetron (ZOFRAN-ODT) disintegrating tablet 4 mg (4 mg Oral Given 01/25/22 2257)  famotidine (PEPCID) tablet 10 mg (has no administration in time range)  melatonin tablet 3 mg (has no administration in time range)  potassium chloride SA (KLOR-CON M) CR tablet 40 mEq (40 mEq Oral Given 01/26/22 0642)  sodium chloride 0.9 % bolus 1,000 mL (0 mLs Intravenous Stopped 01/25/22 1309)  ondansetron (ZOFRAN) injection 4 mg (4 mg Intravenous Given 01/25/22 1236)  LORazepam (ATIVAN) injection 0.5 mg (0.5 mg Intravenous Given 01/25/22 1239)  dicyclomine (BENTYL) capsule 10 mg (10 mg Oral Given 01/25/22 1453)  diltiazem (CARDIZEM) injection 5 mg (5 mg Intravenous Given 01/25/22 1530)  magnesium sulfate IVPB 2 g 50 mL (2 g Intravenous New Bag/Given 01/26/22 6283)     Mobility walks Moderate fall risk   Focused Assessments Cardiac Assessment Handoff:  Cardiac Rhythm: Sinus tachycardia Lab Results  Component Value Date   CKTOTAL 47 11/20/2021   Lab Results  Component Value Date   DDIMER  05/14/2007    <0.22        AT THE INHOUSE ESTABLISHED CUTOFF VALUE  OF 0.48 ug/mL FEU, THIS ASSAY HAS BEEN DOCUMENTED IN THE LITERATURE TO HAVE   Does the Patient currently have chest pain? No    R Recommendations: See Admitting Provider Note  Report given to: ashli sampson, rn  Additional Notes: constant n/v/d. On purawick. MD was just in there talking with the patient.

## 2022-01-26 NOTE — TOC Initial Note (Addendum)
Transition of Care [facility]) - Initial/Assessment Note    Patient Details  Name: Karen Casey MRN: [id number] Date of Birth: 10-Jun-1956  Transition of [facility]) CM/SW Contact:    Marilu Favre, RN Phone Number: 01/26/2022, 11:05 AM  Clinical Narrative:                  Spoke to patient at bedside.   Confirmed face sheet information.   Patient from home with significant other Elenore Rota.   PCP is [facility]    Patient has transportation to appointments and can get prescriptions filled.   Patient has Medicare part A and [employer] fast streamer. She does not have cards with her.   [facility] [address] [facility]    Patient active with [facility] for HHRN,PT,OT and SW    Confirmed with Amy with [facility], she will need new orders and face to face . Secure chatted attending team . Dr Nita Sells received chat and team will enter orders  Expected Discharge Plan: [place] Barriers to Discharge: Continued Medical Work up   Patient Goals and CMS Choice Patient states their goals for this hospitalization and ongoing recovery are:: to return to home      Expected Discharge Plan and Services Expected Discharge Plan: [place]   Discharge Planning Services: CM Consult Post Acute Care Choice: [place] arrangements for the past 2 months: Apartment                 DME Arranged: N/A         HH Arranged: OT, PT, RN, Social Work          Prior Living Arrangements/Services Living arrangements for the past 2 months: Apartment Lives with:: Significant Other Patient language and need for interpreter reviewed:: Yes Do you feel safe going back to the place where you live?: Yes      Need for Family Participation in Patient Care: Yes (Comment) Care giver support system in place?: Yes (comment)   Criminal Activity/Legal Involvement Pertinent to Current Situation/Hospitalization: No - Comment as  needed  Activities of Daily Living      Permission Sought/Granted   Permission granted to share information with : No              Emotional Assessment Appearance:: Appears stated age Attitude/Demeanor/Rapport: Engaged Affect (typically observed): Accepting Orientation: : Oriented to Situation, Oriented to  Time, Oriented to Place, Oriented to Self Alcohol / Substance Use: Not Applicable Psych Involvement: No (comment)  Admission diagnosis:  Diarrhea [R19.7] Dehydration [E86.0] Patient Active Problem List   Diagnosis Date Noted   Diarrhea 01/25/2022   Dehydration 01/25/2022   Tachycardia 01/25/2022   Colitis    Bacteremia due to Enterococcus    Dysphagia 01/06/2022   Ambulatory dysfunction 01/06/2022   Sepsis ([place]) 01/02/2022   Encephalopathy acute    Acute respiratory failure with hypoxia (HCC)    Aspiration pneumonia ([place]) 11/22/2021   Right rib fracture 11/20/2021   Elevated LFTs 11/20/2021   Osteoporosis 09/23/2021   Failure to thrive in adult 09/23/2021   Protein-calorie malnutrition, severe 07/28/2021   Vitamin D deficiency 07/03/2021   Peripheral neuropathy    Seborrheic keratoses 05/29/2021   Hepatic steatosis 01/05/2021   Hypokalemia 01/02/2021   COPD (chronic obstructive pulmonary disease) ([place]) 12/09/2020   Chronic pain of breast 11/12/2020   Compression fracture of body of thoracic vertebra ([place]) 11/06/2020   SIADH (syndrome of inappropriate ADH  production) ([place]) 10/12/2020   Protein-calorie malnutrition ([place]) 09/17/2020   Paroxysmal atrial fibrillation (HCC)    Shortness of breath    Alcohol withdrawal ([place]) 09/10/2020   Pulmonary nodules/lesions, multiple 07/14/2020   HTN (hypertension) 06/02/2018   Left foot pain 08/23/2017   Oral candidiasis 12/20/2013   Body mass index (BMI) 31.0-31.9, adult 03/29/2013   Community acquired pneumonia 03/14/2013   Anxiety disorder 06/12/2012   Alcohol use 05/28/2012   Benzodiazepine withdrawal without  complication ([place]) 12/05/7626   Tobacco abuse 02/17/2012   GERD (gastroesophageal reflux disease) 02/04/2012   Nausea vomiting and diarrhea 02/04/2012   Depression 02/04/2012   PCP:  [name], MD [facility]   [id number] [place] [place]: [phone]: [phone] [place]: [phone]: [phone]    Social Determinants of Health (SDOH) Interventions    Readmission Risk Interventions    01/03/2022   11:30 AM 12/01/2021    3:19 PM 11/23/2021    2:58 PM  Readmission Risk Prevention Plan  Transportation Screening Complete Complete   Medication Review (RN Care Manager) Complete Complete Complete  PCP or Specialist appointment within 3-5 days of discharge Complete Complete   HRI or Home Care Consult Complete Complete Complete  SW Recovery Care/Counseling Consult Complete Complete Complete  Palliative Care Screening Not Applicable Not Applicable Not [place] Applic[place] Patient Refused Not Complete  SNF Comments   Patient's orientation is fluctuating and patient was in mittens in last 24 hours.

## 2022-01-27 ENCOUNTER — Other Ambulatory Visit (HOSPITAL_COMMUNITY): Payer: Self-pay

## 2022-01-27 ENCOUNTER — Telehealth (HOSPITAL_COMMUNITY): Payer: Self-pay | Admitting: Pharmacy Technician

## 2022-01-27 ENCOUNTER — Ambulatory Visit: Payer: 59 | Admitting: Neurology

## 2022-01-27 ENCOUNTER — Ambulatory Visit: Payer: Self-pay | Admitting: Family Medicine

## 2022-01-27 DIAGNOSIS — D72829 Elevated white blood cell count, unspecified: Secondary | ICD-10-CM

## 2022-01-27 DIAGNOSIS — E876 Hypokalemia: Secondary | ICD-10-CM

## 2022-01-27 LAB — BASIC METABOLIC PANEL
Anion gap: 7 (ref 5–15)
BUN: 5 mg/dL — ABNORMAL LOW (ref 8–23)
CO2: 23 mmol/L (ref 22–32)
Calcium: 7.9 mg/dL — ABNORMAL LOW (ref 8.9–10.3)
Chloride: 101 mmol/L (ref 98–111)
Creatinine, Ser: 0.56 mg/dL (ref 0.44–1.00)
GFR, Estimated: >60 mL/min (ref >60)
Glucose, Bld: 104 mg/dL — ABNORMAL HIGH (ref 70–99)
Potassium: 3 mmol/L — ABNORMAL LOW (ref 3.5–5.1)
Sodium: 131 mmol/L — ABNORMAL LOW (ref 135–145)

## 2022-01-27 LAB — CBC
HCT: 36.7 % (ref 36.0–46.0)
Hemoglobin: 12.3 g/dL (ref 12.0–15.0)
MCH: 32 pg (ref 26.0–34.0)
MCHC: 33.5 g/dL (ref 30.0–36.0)
MCV: 95.6 fL (ref 80.0–100.0)
Platelets: 195 10*3/uL (ref 150–400)
RBC: 3.84 MIL/uL — ABNORMAL LOW (ref 3.87–5.11)
RDW: 13.4 % (ref 11.5–15.5)
WBC: 16.6 10*3/uL — ABNORMAL HIGH (ref 4.0–10.5)
nRBC: 0 % (ref 0.0–0.2)

## 2022-01-27 LAB — MAGNESIUM: Magnesium: 1.5 mg/dL — ABNORMAL LOW (ref 1.7–2.4)

## 2022-01-27 MED ORDER — AMIODARONE HCL IN DEXTROSE 360-4.14 MG/200ML-% IV SOLN
60.0000 mg/h | INTRAVENOUS | Status: DC
Start: 2022-01-27 — End: 2022-01-28
  Administered 2022-01-27: 60 mg/h via INTRAVENOUS
  Filled 2022-01-27: qty 200

## 2022-01-27 MED ORDER — DILTIAZEM HCL-DEXTROSE 125-5 MG/125ML-% IV SOLN (PREMIX)
5.0000 mg/h | INTRAVENOUS | Status: DC
  Filled 2022-01-27: qty 125

## 2022-01-27 MED ORDER — POTASSIUM CHLORIDE CRYS ER 20 MEQ PO TBCR
40.0000 meq | EXTENDED_RELEASE_TABLET | Freq: Two times a day (BID) | ORAL | Status: DC
  Administered 2022-01-27: 40 meq via ORAL
  Filled 2022-01-27: qty 2

## 2022-01-27 MED ORDER — MAGNESIUM SULFATE 2 GM/50ML IV SOLN
2.0000 g | Freq: Once | INTRAVENOUS | Status: AC
  Administered 2022-01-27: 2 g via INTRAVENOUS
  Filled 2022-01-27: qty 50

## 2022-01-27 MED ORDER — APIXABAN 5 MG PO TABS
5.0000 mg | ORAL_TABLET | Freq: Two times a day (BID) | ORAL | Status: DC
  Administered 2022-01-27: 5 mg via ORAL
  Filled 2022-01-27 (×2): qty 1

## 2022-01-27 MED ORDER — METOPROLOL TARTRATE 25 MG PO TABS
25.0000 mg | ORAL_TABLET | Freq: Two times a day (BID) | ORAL | Status: DC
  Administered 2022-01-27: 25 mg via ORAL
  Filled 2022-01-27: qty 1

## 2022-01-27 MED ORDER — HEPARIN (PORCINE) 25000 UT/250ML-% IV SOLN
850.0000 [IU]/h | INTRAVENOUS | Status: DC
  Administered 2022-01-27: 850 [IU]/h via INTRAVENOUS
  Filled 2022-01-27: qty 250

## 2022-01-27 MED ORDER — METOPROLOL TARTRATE 5 MG/5ML IV SOLN
5.0000 mg | Freq: Once | INTRAVENOUS | Status: AC
  Administered 2022-01-27: 5 mg via INTRAVENOUS
  Filled 2022-01-27: qty 5

## 2022-01-27 MED ORDER — AMIODARONE HCL IN DEXTROSE 360-4.14 MG/200ML-% IV SOLN
30.0000 mg/h | INTRAVENOUS | Status: DC
  Administered 2022-01-28: 30 mg/h via INTRAVENOUS
  Filled 2022-01-27: qty 200

## 2022-01-27 MED ORDER — DILTIAZEM LOAD VIA INFUSION
10.0000 mg | Freq: Once | INTRAVENOUS | Status: DC
  Filled 2022-01-27: qty 10

## 2022-01-27 MED ORDER — HEPARIN (PORCINE) 25000 UT/250ML-% IV SOLN
900.0000 [IU]/h | INTRAVENOUS | Status: DC
  Administered 2022-01-28: 900 [IU]/h via INTRAVENOUS
  Filled 2022-01-27: qty 250

## 2022-01-27 MED ORDER — SODIUM CHLORIDE 0.9 % IV SOLN: INTRAVENOUS | Status: DC

## 2022-01-27 MED ORDER — LACTATED RINGERS IV BOLUS
1000.0000 mL | Freq: Once | INTRAVENOUS | Status: AC
Start: 2022-01-27 — End: 2022-01-27
  Administered 2022-01-27: 1000 mL via INTRAVENOUS

## 2022-01-27 MED ORDER — POTASSIUM CHLORIDE CRYS ER 20 MEQ PO TBCR
40.0000 meq | EXTENDED_RELEASE_TABLET | Freq: Two times a day (BID) | ORAL | Status: AC
  Administered 2022-01-27: 40 meq via ORAL
  Filled 2022-01-27: qty 2

## 2022-01-27 NOTE — Progress Notes (Signed)
   01/27/22 1008  Assess: MEWS Score  Temp 97.9 F (36.6 C)  BP (!) 98/58  Pulse Rate (!) 105  Resp 18  Level of Consciousness Alert  SpO2 95 %  O2 Device Room Air  Assess: MEWS Score  MEWS Temp 0  MEWS Systolic 1  MEWS Pulse 1  MEWS RR 0  MEWS LOC 0  MEWS Score 2  MEWS Score Color Yellow  Assess: if the MEWS score is Yellow or Red  Were vital signs taken at a resting state? Yes  Focused Assessment No change from prior assessment  Does the patient meet 2 or more of the SIRS criteria? No  Does the patient have a confirmed or suspected source of infection? No  Provider and Rapid Response Notified? Yes  MEWS guidelines implemented *See Row Information* No, previously yellow, continue vital signs every 4 hours  Assess: SIRS CRITERIA  SIRS Temperature  0  SIRS Pulse 1  SIRS Respirations  0  SIRS WBC 1  SIRS Score Sum  2

## 2022-01-27 NOTE — Plan of Care (Signed)
  Problem: Education: Goal: Knowledge of General Education information will improve Description Including pain rating scale, medication(s)/side effects and non-pharmacologic comfort measures Outcome: Progressing   Problem: Health Behavior/Discharge Planning: Goal: Ability to manage health-related needs will improve Outcome: Progressing   

## 2022-01-27 NOTE — Progress Notes (Signed)
FMTS Interim Progress Note  S: We were paged that patient's heart rate increased to the 130-160 range, still A-fib.  I evaluated patient at bedside with Dr. Nancy Fetter.  She reports that her heart is racing, denies other symptoms at this time.  O: BP 97/65 (BP Location: Right Arm)   Pulse (!) 120   Temp 97.9 F (36.6 C) (Oral)   Resp 18   Ht '5\' 3"'$  (1.6 m)   Wt 59 kg   SpO2 95%   BMI 23.04 kg/m   Tachycardic and irregular rhythm on auscultation.  A-fib identified on telemetry.  A/P: Heart rate had improved with first dose of IV metoprolol with rates under 110.  We will try repeat dose of IV metoprolol.  Blood pressure was soft, we ordered additional 1 LR.  If patient becomes more symptomatic and heart rate continues to stay consistently above 120, we will consider moving to floor capable of diltiazem drip.  Leslie Dales, MD 01/27/2022, 1:37 AM PGY-1, [facility] pager [phone]

## 2022-01-27 NOTE — Progress Notes (Addendum)
ANTICOAGULATION CONSULT NOTE - Initial Consult  Pharmacy Consult for Heparin Indication: atrial fibrillation  Allergies  Allergen Reactions   Capsaicin-Menthol Rash and Other (See Comments)    Burning and peeling on area applied to   Diclo Gel [Diclofenac Sodium] Rash and Other (See Comments)    Burning and peeling at the sight of application.    Patient Measurements: Height: '5\' 3"'$  (160 cm) Weight: 59 kg (130 lb 1.1 oz) IBW/kg (Calculated) : 52.4 Heparin Dosing Weight: 59 kg  Vital Signs: Temp: 97.5 F (36.4 C) (08/16 2015) Temp Source: Oral (08/16 2015) BP: 85/67 (08/16 2030) Pulse Rate: 124 (08/16 2015)  Labs: Recent Labs    01/25/22 1121 01/25/22 1326 01/26/22 0350 01/27/22 0336  HGB 15.2*  --  13.2 12.3  HCT 44.7  --  37.8 36.7  PLT 351  --  301 195  CREATININE  --  0.64 0.72 0.56    Estimated Creatinine Clearance: 59.5 mL/min (by C-G formula based on SCr of 0.56 mg/dL).   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 Patient received 1 dose of 5 mg at 1007 AM. Pharmacy consult to transition back to IV Heparin in case of potential  cardioversion.   Goal of Therapy:  Heparin level 0.3-0.7 units/ml Monitor platelets by anticoagulation protocol: Yes   Plan:  Start IV Heparin at 2200 PM - Heparin 900 units/hour.  Heparin level and aPTT in 6 hours due to recent Apixaban.  Daily Heparin level, aPTT, and CBC while on therapy.   Sloan Leiter, PharmD, BCPS, BCCCP Clinical Pharmacist Please refer to [facility] for [place] numbers 01/27/2022,9:23 PM

## 2022-01-27 NOTE — Hospital Course (Addendum)
Karen Casey is a 66 y.o. female who presented with recurrent enterocolitis, received supportive therapy with spontaneous resolution, stay complicated by a-fib with RVR now s/p cardioversion with successful conversion to NSR.  Enterocolitis Patient was recently discharged on 7/27 for enterocolitis + enterococcus bacteremia, was seen by ID and and was discharged on linezolid for 13 days. Per patient and pharmacy tech note, she could not afford the prescribed antibiotics. She said she took another antibiotic instead of the prescribed linezolid. This presentation is likely due to a recurrence of enterocolitis secondary to inadequate abx treatment in the setting of poor patient access. C. Difficile obtained - negative. Patient has history of GERD. ID consulted and did not recommend antibiotic treatment at this time. There was concern for SBO vs ileus on abdominal xray. General surgery was consulted and recommended CT abdomen/pelvis w contrast to r/o SBO definitively, which came back negative for SBO. ID following throughout stay.  Tachycardia with atrial fibrillation On 8/15 had tachycardic episodes in 130 - 160 range with a-fib, placed on metoprolol. Has history of untreated paroxysmal afib, also worsened in the setting of dehydration. Not on chronic anticoagulation per outpatient providers, was regular on initial exam. Per cardiology, no diltiazem necessary, but placed on metoprolol BID and oral apixaban, stopped heparin. Transferred to progressive for continuous monitoring. Cardioversion performed with successful conversion to normal sinus rhythm. Continue apixaban and amiodarone per cardiology.  Bacteremia due to Enterococcus H/o recent bacteremia with enterococcus avium, blood cx obtained in ED. Sent out on linezolid x2 weeks but patient not able to take. Per pharmacy notes, was denied coverage. Also lost to ID f/u. TTE last admission negative for endocarditis. ID is following. Blood cultures no growth  to date.  Anxiety disorder Patient's sister reports of concern regarding severe anxiety, s/p ativan x1 dose in ED with history of chronic opioid use but has since been completely weaned. Stopped buspar last admission due to risk of serotonin syndrome with Zyvox. Was continued on nortriptyline home dose, started on hydroxyzine PRN TID.  Hypertension Amlodipine held for soft pressures. Reassess need to resume after discharged.  COPD Continue with home albuterol PRN and equivalent to Trelegy.    Alcohol use Denies recent use without evidence of withdrawal. Pt states she achieved prolonged sobriety but cannot state for how long. Was placed on CIWA protocol, but never developed withdrawal symptoms. Continued with folate home dose    Oral candidiasis Noted on last admission but not mentioned during this admission. Treated previously with Fluconazole, check on outpatient follow-up.  Started on Eliquis and Amiodarone post cardioversion. Will need to continue for 1 month. Will need [facility] cardiology follow up outpatient.  Recommended for dysphagia 3 diet. Evaluated by SLP inpatient. Consider outpt GI referral if continued difficulty with PO. F/u blood pressure, decide if you want to restart Amlodipine. CBC to follow up hemoglobin (9.6 at discharge). Evaluate urinary retention

## 2022-01-27 NOTE — Progress Notes (Signed)
Interim Progress Note  [facility] Cardiology for her A-fib with RVR. D/w Dr. Virgina Jock. Recommends optimizing electrolytes. Because HR is now controlled he would advise against diltizem drip. She is also already on Amlodipine. Recommends PO Metop tartrate 25 mg BID. Recommends starting Eliquis. Plans for outpatient cardioversion.   Full consult note to follow.

## 2022-01-27 NOTE — Progress Notes (Signed)
Report called and patient transferred by [facility]

## 2022-01-27 NOTE — Progress Notes (Signed)
Pt had multiple loose stools during the night. RN notified. Multiple baths were given and barrier cream was applied each time. Pts bottom is still very raw. Will continue to monitor.

## 2022-01-27 NOTE — Progress Notes (Signed)
HR improved with IV metoprolol, rate mostly 100s-110s with occasional spikes to 120s to 130s. Patient feels slightly better. Can consider repeat dose of IV metoprolol of HR consistently >120s  Reviewed CT results with patient. No obstruction. Signs of enterocolitis and gastritis.

## 2022-01-27 NOTE — Progress Notes (Signed)
SLP Cancellation Note  Patient Details Name: Karen Casey MRN: [id number] DOB: [birth year]   Cancelled treatment:       Reason Eval/Treat Not Completed: Medical issues which prohibited therapy. NPO awaiting CT for SBO vs ileus. SLP will follow for readiness to return to PO's.   Sonia Baller, MA, CCC-SLP Speech Therapy

## 2022-01-27 NOTE — Progress Notes (Signed)
There was a IV consult for PIV access. According to our IV team note (8/15), our IV team recommended central line if patient need more than current access. Discussed patient's RN this matter and patient's RN Larene Beach will contact MD. Duaine Dredge RN

## 2022-01-27 NOTE — Progress Notes (Signed)
  Amiodarone Drug - Drug Interaction Consult Note  Recommendations: Continue with amiodarone therapy. Some interactions that require monitoring only.  Lopressor and amiodarone can cause bradycardia- continue through with therapy and monitor  Pamelor and Zofran can increase Qtc. Monitor therapy. Zofran is ordered prn- would expect minimal impact dependant on use   Amiodarone is metabolized by the cytochrome P450 system and therefore has the potential to cause many drug interactions. Amiodarone has an average plasma half-life of 50 days (range 20 to 100 days).   There is potential for drug interactions to occur several weeks or months after stopping treatment and the onset of drug interactions may be slow after initiating amiodarone.   '[]'$  Statins: Increased risk of myopathy. Simvastatin- restrict dose to '20mg'$  daily. Other statins: counsel patients to report any muscle pain or weakness immediately.  '[]'$  Anticoagulants: Amiodarone can increase anticoagulant effect. Consider warfarin dose reduction. Patients should be monitored closely and the dose of anticoagulant altered accordingly, remembering that amiodarone levels take several weeks to stabilize.  '[]'$  Antiepileptics: Amiodarone can increase plasma concentration of phenytoin, the dose should be reduced. Note that small changes in phenytoin dose can result in large changes in levels. Monitor patient and counsel on signs of toxicity.  '[x]'$  Beta blockers: increased risk of bradycardia, AV block and myocardial depression. Sotalol - avoid concomitant use.  '[]'$   Calcium channel blockers (diltiazem and verapamil): increased risk of bradycardia, AV block and myocardial depression.  '[]'$   Cyclosporine: Amiodarone increases levels of cyclosporine. Reduced dose of cyclosporine is recommended.  '[]'$  Digoxin dose should be halved when amiodarone is started.  '[]'$  Diuretics: increased risk of cardiotoxicity if hypokalemia occurs.  '[]'$  Oral hypoglycemic agents  (glyburide, glipizide, glimepiride): increased risk of hypoglycemia. Patient's glucose levels should be monitored closely when initiating amiodarone therapy.   '[x]'$  Drugs that prolong the QT interval:  Torsades de pointes risk may be increased with concurrent use - avoid if possible.  Monitor QTc, also keep magnesium/potassium WNL if concurrent therapy can't be avoided.  Antibiotics: e.g. fluoroquinolones, erythromycin.  Antiarrhythmics: e.g. quinidine, procainamide, disopyramide, sotalol.  Antipsychotics: e.g. phenothiazines, haloperidol.   Lithium, tricyclic antidepressants, and methadone. Thank You,   Vaughan Basta BS, PharmD, BCPS Clinical Pharmacist 01/27/2022 7:17 PM  Contact: [phone] after 3 PM  "Be curious, not judgmental..." -Jamal Maes

## 2022-01-27 NOTE — Consult Note (Signed)
CARDIOLOGY CONSULT NOTE  Patient ID: Karen Casey MRN: [id number] DOB/AGE: June 07, 1956 66 y.o.  Admit date: 01/25/2022 Referring Physician: Dorcas Mcmurray, MD Reason for Consultation:  Afib  HPI:   66 y.o. Caucasian female  with h/o hypertension, COPD, anxiety, h/o alcohol abuse, PAF.  Patient is admitted with nausea, vomiting, diarrhea for which workup is ongoing. Cardiology were consulted for management of Afib.  Patient denies any chest pain, shortness of breath, not entirely correlating symptoms of palpitations with presence of Afib. Workup has showed several electrolyte imbalances.   Previously, her Afib was thought be due to alcohol abuse. She says that she has not had any alcohol in last several months now.   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   Seizure ([place])    08/2020 per patient   Seizures ([place])    xanax withdrawl- December 2013   Urinary incontinence 06/09/2012     Past Surgical History:  Procedure Laterality Date   APPENDECTOMY     BIOPSY  01/04/2021   Procedure: BIOPSY;  Surgeon: Yetta Flock, MD;  Location: [place];  Service: Gastroenterology;;   BIOPSY  03/12/2021   Procedure: BIOPSY;  Surgeon: Irving Copas., MD;  Location: [place];  Service: Gastroenterology;;   CATARACT EXTRACTION  06/09/2012   Left eye   COLONOSCOPY WITH PROPOFOL N/A 03/12/2021   Procedure: COLONOSCOPY WITH PROPOFOL;  Surgeon:  Irving Copas., MD;  Location: [place];  Service: Gastroenterology;  Laterality: N/A;   ESOPHAGOGASTRODUODENOSCOPY (EGD) WITH PROPOFOL N/A 01/04/2021   Procedure: ESOPHAGOGASTRODUODENOSCOPY (EGD) WITH PROPOFOL;  Surgeon: Yetta Flock, MD;  Location: [place];  Service: Gastroenterology;  Laterality: N/A;   left shoulder dislocation  Sept 2011   POLYPECTOMY  03/12/2021   Procedure: POLYPECTOMY;  Surgeon: Mansouraty, Telford Nab., MD;  Location: [facility] ENDOSCOPY;  Service: Gastroenterology;;      Family History  Problem Relation Age of Onset   Hypertension Mother    Hyperlipidemia Mother    Aneurysm Mother        Rupture - Cause of death   Heart disease Father        MI - cause of death   Depression Father    Parkinsonism Father    Hypertension Sister    Colon cancer Neg Hx    Esophageal cancer Neg Hx    Rectal cancer Neg Hx    Stomach cancer Neg Hx      Social History: Social History   Socioeconomic History   Marital status: Single    Spouse name: Not on file   Number of children: 1   Years of education: Not on file   Highest education level: Not on file  Occupational History   Not on file  Tobacco Use   Smoking status: Every Day    Packs/day: 0.50    Years: 43.00  Total pack years: 21.50    Types: Cigarettes   Smokeless tobacco: Never   Tobacco comments:    Tobacco info given  Vaping Use   Vaping Use: Never used  Substance and Sexual Activity   Alcohol use: Yes    Alcohol/week: 3.0 standard drinks of alcohol    Types: 3 Cans of beer per week    Comment: 1-2 times week just beer   Drug use: Yes    Types: Marijuana, Other-see comments    Comment: Past hx of benzo abuse--xanax   Sexual activity: Not on file  Other Topics Concern   Not on file  Social History Narrative   Not on file   Social Determinants of Health   Financial Resource Strain: Not on file  Food Insecurity: No Food Insecurity (12/25/2021)   Hunger Vital Sign    Worried  About Running Out of Food in the Last Year: Never true    Ran Out of Food in the Last Year: Never true  Transportation Needs: Not on file  Physical Activity: Not on file  Stress: Not on file  Social Connections: Not on file  Intimate Partner Violence: Not on file     Medications Prior to Admission  Medication Sig Dispense Refill Last Dose   acetaminophen (TYLENOL) 650 MG CR tablet Take 1 tablet (650 mg total) by mouth every 6 (six) hours as needed for pain.   Past Month   albuterol (VENTOLIN HFA) 108 (90 Base) MCG/ACT inhaler Inhale 2 puffs into the lungs every 6 (six) hours as needed for wheezing or shortness of breath. 1 each 2 UNK   amLODipine (NORVASC) 5 MG tablet Take 5 mg by mouth daily.   Past Week   busPIRone (BUSPAR) 10 MG tablet Take 10 mg by mouth 3 (three) times daily as needed (anxiety).   Past Month   CALCIUM PO Take 1 tablet by mouth daily.   Past Week   Cholecalciferol (VITAMIN D3 PO) Take 1 capsule by mouth daily.   Past Week   Fluticasone-Umeclidin-Vilant (TRELEGY ELLIPTA) 100-62.5-25 MCG/ACT AEPB Inhale 2 puffs into the lungs daily.   Past Week   FOLIC ACID PO Take 1 tablet by mouth daily.   Past Week   gabapentin (NEURONTIN) 600 MG tablet TAKE 1 TABLET BY MOUTH THREE TIMES A DAY (Patient taking differently: Take 600 mg by mouth 3 (three) times daily as needed (neuropathy).) 90 tablet 1 Past Month   Multiple Vitamins-Minerals (CENTRUM SILVER 50+WOMEN PO) Take 1 tablet by mouth daily.   Past Week   nortriptyline (PAMELOR) 25 MG capsule Take 3 capsules (75 mg total) by mouth at bedtime. This was patient's prior to admission home dose that she was taking.   Past Week   Nutritional Supplements (ENSURE ORIGINAL) LIQD Take 1 Package by mouth daily as needed (loss of appetite). Chocolate only   Past Month   OXYGEN Inhale into the lungs as needed.   01/24/2022   pantoprazole (PROTONIX) 40 MG tablet Take 1 tablet (40 mg total) by mouth every morning. (Patient taking differently: Take  40 mg by mouth daily as needed (acid reflux).) 30 tablet 0 UNK   amoxicillin-clavulanate (AUGMENTIN) 875-125 MG tablet Take 1 tablet by mouth 2 (two) times daily. (Patient not taking: Reported on 01/26/2022)   Not Taking   folic acid (FOLVITE) 1 MG tablet Take 1 tablet (1 mg total) by mouth daily. (Patient not taking: Reported on 01/26/2022)   Not Taking   QUEtiapine (SEROQUEL) 100 MG tablet Take  100 mg by mouth at bedtime. (Patient not taking: Reported on 01/26/2022)   Not Taking    Review of Systems  Cardiovascular:  Negative for chest pain, dyspnea on exertion, leg swelling, palpitations and syncope.  Gastrointestinal:  Positive for diarrhea, nausea and vomiting.      Physical Exam: Physical Exam Vitals and nursing note reviewed.  Constitutional:      General: She is in acute distress.     Appearance: She is ill-appearing.  Neck:     Vascular: No JVD.  Cardiovascular:     Rate and Rhythm: Normal rate. Rhythm irregular.     Heart sounds: Normal heart sounds. No murmur heard. Pulmonary:     Effort: Pulmonary effort is normal.     Breath sounds: Normal breath sounds. No wheezing or rales.  Musculoskeletal:     Right lower leg: No edema.     Left lower leg: No edema.        Imaging/tests reviewed and independently interpreted: Lab Results: CBC. BMP  Cardiac Studies:  Telemetry 01/27/2022: Afib w/RVR, rate in 130s  EKG 01/26/2022: Afib w/RVR  Echocardiogram 01/06/2022:  1. Left ventricular ejection fraction by 3D volume is 63 %. The left  ventricle has normal function. The left ventricle has no regional wall  motion abnormalities. Left ventricular diastolic parameters are  indeterminate.   2. Right ventricular systolic function is normal. The right ventricular  size is normal. There is normal pulmonary artery systolic pressure. The  estimated right ventricular systolic pressure is 95.1 mmHg.   3. Left atrial size was mildly dilated.   4. The mitral valve is normal in  structure. Mild mitral valve  regurgitation. No evidence of mitral stenosis.   5. The aortic valve is tricuspid. There is mild calcification of the  aortic valve. Aortic valve regurgitation is not visualized. No aortic  stenosis is present.   6. The inferior vena cava is normal in size with greater than 50%  respiratory variability, suggesting right atrial pressure of 3 mmHg.   7. Rhythm strip during this exam demonstrates premature atrial  contractions.    Assessment & Recommendations:  66 y.o. Caucasian female  with h/o hypertension, COPD, anxiety, h/o alcohol abuse, PAF.  PAF: Afib w/RVR. Certainly triggered by her rather acute on chronic GI illness with nausea, vomiting, diarrhea and several electrolyte imbalances.  Rhythm changed from sinus tachycardia to Afib on 01/26/2022 evening.  However, I cannot be entirely surely that she may not have had any recent Afib. She certainly has had Afib in the past. She is currently not on anticoagulation due to CHA2DS2VASc score of 1.  Overall, I would reserve cardioversion only if rate control is not achieved by other means. Unable to use beta blocker currently due to hypotension in the setting of diarrhea, vomiting.  Added IV amiodarone without bolus. Given possibility of cardioversion if rate not controlled, recommend anticoagulation. I am unsure of her ability to take PO meds at this time. Therefore, recommend heparin.   Discussed interpretation of tests and management recommendations with the primary team     Nigel Mormon, MD Pager: [phone] Office: [phone]

## 2022-01-27 NOTE — Progress Notes (Signed)
Daily Progress Note Intern Pager: [phone]  Patient name: Karen Casey Medical record number: [id number] Date of birth: 08/25/1955 Age: 66 y.o. Gender: female  Primary Care Provider: [name], MD Consultants: ID Code Status: FULL  Pt Overview and Major Events to Date:  - 8/14 admitted for vomiting, diarrhea. ID consulted for concerns of inadequately treated enterococcus bacteremia - 8/15-8/16 tachycardia in 130 - 160 range, transferred to progressive  Assessment and Plan: Karen Casey is a 66 y.o. female presenting with vomiting and diarrhea likely secondary to ongoing enterocolitis. Relevant PMHx includes recent history of enterococcus bacteremia treated with linezolid and lost to follow-up with ID, GERD  * Nausea vomiting and diarrhea D/c on 7/27 for enterocolitis + enterococcus bacteremia. This presentation likely due to recurrence of enterocolitis, inadequate abx treatment. C. difficile negative without known infectious source. Hx GERD. ID consulted. WBC up from 14.2 to 16.6. ID ordered stool culture and GI PCR panel - admitted to [facility] med-surg, attending Dr. Nori Riis - appreciate ID recs - LR 100 MLS/hour (EF 63%) - Loperamide (Imodium) for diarrhea - Pantoprazole (Protonix) 40 mg daily - famotidine (Pepcid) PRN for heartburn - on ondansetron (Zofran) for n/v - monitor fluid status - f/u stool culture, GI PCR panel  Tachycardia On 8/15 had tachycardic episodes in 130 - 160 range with a-fib, placed on metoprolol. Hx untreated paroxysmal afib, also worsened in the setting of dehydration. Per cardiology, no diltiazem necessary, stop heparin - give metoprolol BID - give apixaban - d/c heparin - per cards, outpatient cardioversion  Hypokalemia K 3.0, Mg 1.5. In the setting of acute vomiting and diarrhea, kidney function not affected - Continue LR 100 mL/hr (EF 63%) - Zofran PRN (watch Qtc) - Recheck BMP in AM  Bacteremia due to Enterococcus H/o recent  bacteremia with enterococcus avium, blood cx obtained in ED. Sent out on linezolid x2 weeks but unsure if completed - per pharmacy notes, was denied coverage. Also lost to ID f/u. TTE last admission negative for endocarditis. - ID following, appreciate recs - f/u blood cx  Anxiety disorder Patient's sister reports of concern regarding severe anxiety, s/p ativan x1 dose in ED with history of chronic opioid use but has since been completely weaned. Stopped buspar last admission due to risk of serotonin syndrome with Zyvox  - Continue nortriptyline home dose - started on hydroxyzine PRN TID  HTN (hypertension) Home amlodipine   COPD (chronic obstructive pulmonary disease) ([place]) Continue with home albuterol PRN and equivalent to Trelegy.   Paroxysmal atrial fibrillation (HCC) Not on chronic anticoagulation per outpatient providers, regular on exam. S/p cardizem x1 dose. Per cardiology, no diltiazem necessary, stop heparin - give metoprolol BID - continuous monitoring  Alcohol use Denies recent use without evidence of withdrawal. Pt states she achieved prolonged sobriety but cannot state for how long - CIWAs  - continue with folate home dose  - MVM   Oral candidiasis Noted on last admission but not mentioned during admission. Treated previously with Fluconazole, will recheck tomorrow for persistence and treat if indicated     FEN/GI: pantoprazole, famotidine PRN, ondansetron PRN PPx: heparin Dispo:pending clinical improvement  Subjective:  Patient endorses chest pain, hungry and would like to eat.  Objective: Temp:  [97.4 F (36.3 C)-98.6 F (37 C)] 97.8 F (36.6 C) (08/16 0746) Pulse Rate:  [93-155] 105 (08/16 1008) Resp:  [17-18] 17 (08/16 0746) BP: (97-131)/(58-93) 98/58 (08/16 1008) SpO2:  [94 %-98 %] 94 % (08/16 0746) Physical Exam:  General: in no acute distress HEENT: normocephalic and atraumatic Cardiovascular:  tachycardic Respiratory: CTAB anteriorly, normal  respiratory effort, and on RA Gastrointestinal: non-distended Extremities: moving all extremities spontaneously Neuro: following commands and no focal neurological deficits   Laboratory: Most recent CBC Lab Results  Component Value Date   WBC 16.6 (H) 01/27/2022   HGB 12.3 01/27/2022   HCT 36.7 01/27/2022   MCV 95.6 01/27/2022   PLT 195 01/27/2022   Most recent BMP    Latest Ref Rng & Units 01/27/2022    3:36 AM  BMP  Glucose 70 - 99 mg/dL 104   BUN 8 - 23 mg/dL 5   Creatinine 0.44 - 1.00 mg/dL 0.56   Sodium 135 - 145 mmol/L 131   Potassium 3.5 - 5.1 mmol/L 3.0   Chloride 98 - 111 mmol/L 101   CO2 22 - 32 mmol/L 23   Calcium 8.9 - 10.3 mg/dL 7.9     Imaging/Diagnostic Tests: CT Abdomen/Pelvis w/ contrast: IMPRESSION: 1. Bowel wall thickening with mucosal enhancement involving the duodenum and jejunum. Colonic wall thickening with fat stranding involving the ascending colon and proximal transverse colon, suggesting enterocolitis, not significantly changed from the prior exam. No bowel obstruction or free air. 2. Gastric wall thickening with enhancement compatible with gastritis. There is a small hiatal hernia with marked thickening of the walls of the distal esophagus. Endoscopy is recommended for further evaluation on follow-up. 3. Hepatic steatosis. 4. Aortic atherosclerosis.  Camelia Phenes, MD 01/27/2022, 11:32 AM  PGY-1, [place] Intern pager: [phone], text pages welcome Secure chat group [place]

## 2022-01-27 NOTE — Progress Notes (Signed)
Physical Therapy Treatment Patient Details Name: Karen Casey MRN: [id number] DOB: 1955/12/01 Today's Date: 01/27/2022   History of Present Illness 66 y.o. female presenting 8/14 with dehydration, nausea, and diarrhea. Has a fall history but cannot elaborate on it.  Pt denies being able to walk.  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    PT Comments    Continuing work on functional mobility and activity tolerance;  Pt able to a little more this session with lots of encouragement; Eventually agreeable to sitting up to get supine and sitting BPs; Got up to EOB with mod assist,  some dizziness reported; BPs as follows:     01/27/22 1100 01/27/22 1247  Vital Signs  Patient Position (if appropriate) Orthostatic Vitals  --   Orthostatic Lying   BP- Lying (!) 84/64 (!) 87/72  Pulse- Lying 80 (HR on tele 98-107) 84  Orthostatic Sitting  BP- Sitting (!) 78/66  --   Pulse- Sitting 73  --      Recommendations for follow up therapy are one component of a multi-disciplinary discharge planning process, led by the attending physician.  Recommendations may be updated based on patient status, additional functional criteria and insurance authorization.  Follow Up Recommendations   (Anticipate pt will decline SNF, in that case, consider [facility] services) Can patient physically be transported by private vehicle: No   Assistance Recommended at Discharge Frequent or constant Supervision/Assistance  Patient can return home with the following A lot of help with walking and/or transfers;A little help with bathing/dressing/bathroom;Assistance with cooking/housework;Assist for transportation;Help with stairs or ramp for entrance   Equipment Recommendations  None recommended by PT    Recommendations for Other Services        Precautions / Restrictions Precautions Precautions: Fall;Other (comment) Precaution Comments: BPs soft, with SBP decr in upright sitting; BLE neuropathy Restrictions Weight Bearing Restrictions: Yes     Mobility  Bed Mobility Overal bed mobility: Needs Assistance Bed Mobility: Supine to Sit, Sit to Supine Rolling: Supervision   Supine to sit: Mod assist Sit to supine: Min assist   General bed mobility comments: Needed mod handheld assist to pull to sit; min assist to help LEs back into bed    Transfers                        Ambulation/Gait                   Stairs             Wheelchair Mobility    Modified Rankin (Stroke Patients Only)       Balance                                            Cognition Arousal/Alertness: Awake/alert Behavior During Therapy: Flat affect Overall Cognitive Status: No family/caregiver present to determine baseline cognitive functioning                                 General Comments: Seems to have difficulty linking the need to be able to tolerate moving and OOB transfers with the ability to go home  Exercises      General Comments General comments (skin integrity, edema, etc.): BPs soft, with noted SBP drifting down in sitting compared to supine; see vitals flowsheet      Pertinent Vitals/Pain Pain Assessment Pain Assessment: Faces Faces Pain Scale: Hurts even more Pain Location: throat, mouth, as well as bottom, perineum Pain Descriptors / Indicators: Grimacing, Discomfort, Aching Pain Intervention(s): Monitored during session    Home Living                          Prior Function            PT Goals (current goals can now be found in the care plan section) Acute Rehab PT Goals Patient Stated Goal: to go home again PT Goal Formulation: With patient Time For Goal Achievement: 02/09/22 Potential to Achieve Goals: Good Progress towards PT  goals: Progressing toward goals (very slowly; noting some self-limiting behaviors)    Frequency    Min 3X/week      PT Plan Current plan remains appropriate (Will continue to monitor for progress; Pt is clearly stating she wants to dc home)    Co-evaluation              AM-PAC PT "6 Clicks" Mobility   Outcome Measure  Help needed turning from your back to your side while in a flat bed without using bedrails?: None Help needed moving from lying on your back to sitting on the side of a flat bed without using bedrails?: None Help needed moving to and from a bed to a chair (including a wheelchair)?: A Lot Help needed standing up from a chair using your arms (e.g., wheelchair or bedside chair)?: A Lot Help needed to walk in hospital room?: A Lot Help needed climbing 3-5 steps with a railing? : Total 6 Click Score: 15    End of Session Equipment Utilized During Treatment: Other (comment) (vitals machine) Activity Tolerance: Patient limited by fatigue Patient left: in bed;with call bell/phone within reach Nurse Communication: Mobility status PT Visit Diagnosis: History of falling (Z91.81);Other abnormalities of gait and mobility (R26.89)     Time: 3817-7116 PT Time Calculation (min) (ACUTE ONLY): 31 min  Charges:  $Therapeutic Activity: 23-37 mins                     Roney Marion, PT  Acute Rehabilitation Services Office [phone]    [name] 01/27/2022, 2:15 PM

## 2022-01-27 NOTE — Progress Notes (Signed)
ANTICOAGULATION CONSULT NOTE - Initial Consult  Pharmacy Consult for Heparin Indication: atrial fibrillation  Allergies  Allergen Reactions   Capsaicin-Menthol Rash and Other (See Comments)    Burning and peeling on area applied to   Diclo Gel [Diclofenac Sodium] Rash and Other (See Comments)    Burning and peeling at the sight of application.    Patient Measurements: Height: '5\' 3"'$  (160 cm) Weight: 59 kg (130 lb 1.1 oz) IBW/kg (Calculated) : 52.4 Heparin Dosing Weight: 59 kg  Vital Signs: Temp: 97.8 Casey (36.6 C) (08/16 0746) Temp Source: Oral (08/16 0403) BP: 98/62 (08/16 0746) Pulse Rate: 93 (08/16 0746)  Labs: Recent Labs    01/25/22 1121 01/25/22 1326 01/26/22 0350 01/27/22 0336  HGB 15.2*  --  13.2 12.3  HCT 44.7  --  37.8 36.7  PLT 351  --  301 195  CREATININE  --  0.64 0.72 0.56    Estimated Creatinine Clearance: 59.5 mL/min (by C-G formula based on SCr of 0.56 mg/dL).   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   Past Month   albuterol (VENTOLIN HFA) 108 (90 Base)  MCG/ACT inhaler Inhale 2 puffs into the lungs every 6 (six) hours as needed for wheezing or shortness of breath. 1 each 2 UNK   amLODipine (NORVASC) 5 MG tablet Take 5 mg by mouth daily.   Past Week   busPIRone (BUSPAR) 10 MG tablet Take 10 mg by mouth 3 (three) times daily as needed (anxiety).   Past Month   CALCIUM PO Take 1 tablet by mouth daily.   Past Week   Cholecalciferol (VITAMIN D3 PO) Take 1 capsule by mouth daily.   Past Week   Fluticasone-Umeclidin-Vilant (TRELEGY ELLIPTA) 100-62.5-25 MCG/ACT AEPB Inhale 2 puffs into the lungs daily.   Past Week   FOLIC ACID PO Take 1 tablet by mouth daily.   Past Week   gabapentin (NEURONTIN) 600 MG tablet TAKE 1 TABLET BY MOUTH THREE TIMES A DAY (Patient taking differently: Take 600 mg by mouth 3 (three) times daily as needed (neuropathy).) 90 tablet 1 Past Month   Multiple Vitamins-Minerals (CENTRUM SILVER 50+WOMEN PO) Take 1 tablet by mouth daily.   Past Week   nortriptyline (PAMELOR) 25 MG capsule Take 3 capsules (75 mg total) by mouth at bedtime. This was patient's prior to admission home dose that she was taking.   Past Week   Nutritional Supplements (ENSURE ORIGINAL) LIQD Take 1  Package by mouth daily as needed (loss of appetite). Chocolate only   Past Month   OXYGEN Inhale into the lungs as needed.   01/24/2022   pantoprazole (PROTONIX) 40 MG tablet Take 1 tablet (40 mg total) by mouth every morning. (Patient taking differently: Take 40 mg by mouth daily as needed (acid reflux).) 30 tablet 0 UNK   amoxicillin-clavulanate (AUGMENTIN) 875-125 MG tablet Take 1 tablet by mouth 2 (two) times daily. (Patient not taking: Reported on 01/26/2022)   Not Taking   folic acid (FOLVITE) 1 MG tablet Take 1 tablet (1 mg total) by mouth daily. (Patient not taking: Reported on 01/26/2022)   Not Taking   QUEtiapine (SEROQUEL) 100 MG tablet Take 100 mg by mouth at bedtime. (Patient not taking: Reported on 01/26/2022)   Not Taking   Scheduled:   amLODipine  5 mg Oral  Daily   enoxaparin (LOVENOX) injection  40 mg Subcutaneous Q24H   fluticasone furoate-vilanterol  1 puff Inhalation Daily   And   umeclidinium bromide  1 puff Inhalation Daily   folic acid  1 mg Oral Daily   gabapentin  600 mg Oral TID   Gerhardt's butt cream   Topical BID   metoprolol tartrate       multivitamin with minerals  1 tablet Oral Daily   nicotine  14 mg Transdermal Daily   nortriptyline  75 mg Oral QHS   pantoprazole  40 mg Oral q morning   potassium chloride  40 mEq Oral BID   sodium chloride flush  10-40 mL Intracatheter Q12H   PRN: albuterol, famotidine, Gerhardt's butt cream, hydrOXYzine, loperamide, melatonin, metoprolol tartrate, ondansetron, sodium chloride flush, white petrolatum  Assessment: 66 YO Casey with GERD, PAF, COPD, and anxiety/depression who presented for dehydration due to vomiting/diarrhea, now with new atrial fibrillation. Had previous paroxysmal AF during prior admission, but was deemed to be episodic and long-term anticoagulation was not started. This is her second known episode of AF.   Not on any anticoagulation prior to admission.   CHADsVASc score: 2  Hgb 12.3, PLT 195   Goal of Therapy:  Heparin level 0.3-0.7 units/ml Monitor platelets by anticoagulation protocol: Yes   Plan:  Start heparin infusion at 850 units/hr Get anti-Xa level at 1700 and daily while on heparin. Continue to monitor H&H and platelets.   Sinda Du, PharmD Candidate 01/27/2022,7:53 AM

## 2022-01-27 NOTE — Progress Notes (Addendum)
[facility] Infectious Diseases Follow Up Note  Patient Identification: Patient Name: Karen Casey MRN: [id number] [place] Date: 01/25/2022 10:50 AM Age: 66 y.o.Today's Date: 01/27/2022  Reason for Visit: acute diarrhea   Principal Problem:   Nausea vomiting and diarrhea Active Problems:   Alcohol use   Anxiety disorder   Oral candidiasis   HTN (hypertension)   Paroxysmal atrial fibrillation (HCC)   COPD (chronic obstructive pulmonary disease) ([place])   Bacteremia due to Enterococcus   Tachycardia  Antibiotics: None   Lines/Hardwares:   Interval Events: Remains afebrile, continues to have multiple episodes of watery stool yesterday with A fib w RVR s/p IV iv metoprolol and plan for IV diltiazem. No vomiting reported per RN  Assessment 45 Y O Female with PMH of alcohol/tobacco abuse, COPD, Anxiety/Depression, GERD, HTN, Seizure d/o, PAF, Peripheral neuropathy?Neuromuscular disease/ambulatory dysfunction, recent hospital admission for acute enterocolitis and E avium in blood cultures admitted with acute gastroenteritis. HIV NR. Diarrhea seems to have been improving with decreasing frequency. Of note, recently completed  ? Po augmentin for E avium bacteremia  Differential: abtx induced diarrhea in the setting of recent Augmentin use, acute viral gastroenteritis vs others.   Volume depletion/Dehydration  A Fib w RVR on Cardizem drip  Difficult IV access - getting a midline  Leukocytosis - possibility of reactive in the setting of dehydration  Hypokalemia   Recommendations Will get stool culture and GI PCR panel, d/w RN to collect Hold off on abtx for now pending GI PCR and also with considerations of abtx induced diarrhea ( no fevers, no blood in stool) Fluid and electrolyte replacement per Primary  D/w ID pharmacy Following   Rest of the management as per the primary team. Thank you for the consult. Please page with  pertinent questions or concerns.  ______________________________________________________________________ Subjective patient seen and examined at the bedside.  Irritable and uncooperative this morning Multiple episodes of watery loose stool yesterday that she cant even count with abdominal pain + NBNB vomiting She is asking to send her to floor where her heart can be monitored  Vitals BP 98/62 (BP Location: Right Arm)   Pulse 93   Temp 97.8 F (36.6 C)   Resp 17   Ht '5\' 3"'$  (1.6 m)   Wt 59 kg   SpO2 94%   BMI 23.04 kg/m     Physical Exam Constitutional:  lying in the bed and appears dehydrated, irritable     Comments:   Cardiovascular:     Rate and Rhythm: Tachycardic and Irregular rhythm    Heart sounds:   Pulmonary:     Effort: Pulmonary effort is normal on room air     Comments:   Abdominal:     Palpations: Abdomen is soft.     Tenderness: non distended and non tender   Musculoskeletal:        General: No swelling or tenderness in peripheral joints    Skin:    Comments:   Neurological:     General: Grossly non focal, awake, alert and oriented   Pertinent Microbiology Results for orders placed or performed during the hospital encounter of 01/25/22  Urine Culture     Status: Abnormal   Collection Time: 01/25/22 11:26 AM   Specimen: Urine, Clean Catch  Result Value Ref Range Status   Specimen Description URINE, CLEAN CATCH  Final   Special Requests   Final    NONE Performed at [facility], [place] [address] [place], [place] [address]  Culture (A)  Final    30,000 COLONIES/mL MULTIPLE SPECIES PRESENT, SUGGEST RECOLLECTION   Report Status 01/26/2022 FINAL  Final  C Difficile Quick Screen w PCR reflex     Status: None   Collection Time: 01/25/22 11:27 AM   Specimen: STOOL  Result Value Ref Range Status   C Diff antigen NEGATIVE NEGATIVE Final   C Diff toxin NEGATIVE NEGATIVE Final   C Diff interpretation No C. difficile detected.  Final     Comment: Performed at [facility], [place] [address], [place], [place] [address]  Culture, blood (routine x 2)     Status: None (Preliminary result)   Collection Time: 01/25/22 11:29 AM   Specimen: BLOOD RIGHT HAND  Result Value Ref Range Status   Specimen Description BLOOD RIGHT HAND  Final   Special Requests   Final    BOTTLES DRAWN AEROBIC AND ANAEROBIC Blood Culture results may not be optimal due to an inadequate volume of blood received in culture bottles   Culture   Final    NO GROWTH 2 DAYS Performed at [facility], [place] [address], [place], [place] [address]    Report Status PENDING  Incomplete  Culture, blood (routine x 2)     Status: None (Preliminary result)   Collection Time: 01/25/22 12:13 PM   Specimen: BLOOD  Result Value Ref Range Status   Specimen Description BLOOD SITE NOT SPECIFIED  Final   Special Requests   Final    BOTTLES DRAWN AEROBIC ONLY Blood Culture results may not be optimal due to an inadequate volume of blood received in culture bottles   Culture   Final    NO GROWTH 2 DAYS Performed at [facility], [place] [address], [place], [place] [address]    Report Status PENDING  Incomplete    Pertinent Lab.    Latest Ref Rng & Units 01/27/2022    3:36 AM 01/26/2022    3:50 AM 01/25/2022   11:21 AM  CBC  WBC 4.0 - 10.5 K/uL 16.6  14.2  6.4   Hemoglobin 12.0 - 15.0 g/dL 12.3  13.2  15.2   Hematocrit 36.0 - 46.0 % 36.7  37.8  44.7   Platelets 150 - 400 K/uL 195  301  351       Latest Ref Rng & Units 01/27/2022    3:36 AM 01/26/2022    3:50 AM 01/25/2022    1:26 PM  CMP  Glucose 70 - 99 mg/dL 104  109  91   BUN 8 - 23 mg/dL '5  6  5   '$ Creatinine 0.44 - 1.00 mg/dL 0.56  0.72  0.64   Sodium 135 - 145 mmol/L 131  137  135   Potassium 3.5 - 5.1 mmol/L 3.0  2.8  3.8   Chloride 98 - 111 mmol/L 101  98  102   CO2 22 - 32 mmol/L '23  27  22   '$ Calcium 8.9 - 10.3 mg/dL 7.9  8.5  8.4   Total Protein 6.5 - 8.1 g/dL   5.5   Total Bilirubin 0.3 - 1.2  mg/dL   0.6   Alkaline Phos 38 - 126 U/L   93   AST 15 - 41 U/L   44   ALT 0 - 44 U/L   25     Pertinent Imaging today Plain films and CT images have been personally visualized and interpreted; radiology reports have been reviewed. Decision making incorporated into  the Impression / Recommendations.  I spent 51 minutes for this patient encounter including review of prior medical records, coordination of care with primary/other specialist with greater than 50% of time being face to face/counseling and discussing diagnostics/treatment plan with the patient/family.  Electronically signed by:   Rosiland Oz, MD Infectious Disease Physician [facility] for Infectious Disease Pager: [phone]

## 2022-01-27 NOTE — Telephone Encounter (Signed)
Patient Advocate Encounter  Prior Authorization for Eliquis '5MG'$  tablets has been approved.    PA# [id number] Effective dates: 01/27/2022 through 01/27/2023  Patients co-pay is $174.50 due to a $78.50 deductible remaining then will be $96.00.     Lyndel Safe, [place] Patient Advocate Specialist [place] Patient Advocate Team Direct Number: [phone]  Fax: [phone]

## 2022-01-27 NOTE — TOC Benefit Eligibility Note (Signed)
Patient Teacher, English as a foreign language completed.    The patient is currently admitted and upon discharge could be taking Eliquis 5 mg.  Requires Prior Authorization  The patient is insured through [place], [place] Patient Advocate Specialist [place] Patient Advocate Team Direct Number: [phone]  Fax: [phone]

## 2022-01-27 NOTE — Progress Notes (Signed)
FMTS Interim Progress Note  Patient seen at bedside with Dr Nelda Bucks. Reports she is feeling better from a GI standpoint-- diarrhea and vomiting have slowed and she was able to eat something tonight.   Patient in a-fib with RVR on the monitor with rates in the 120s.  BP soft at 80s/60s, although she is not symptomatic from this standpoint.  On exam she is alert, oriented, conversing appropriately, irregularly irregular rhythm, normal respiratory effort.  A-fib with RVR Discussed case with Dr Virgina Jock from cardiology.  -Started on amio gtt -d/c PO metoprolol -Change Eliquis to Heparin in the event she needs cardioversion. If possible he would like to avoid this at the present time given her significant vomiting. -Continue IV hydration and electrolyte repletion -Full cardiology note to follow  Alcus Dad, MD PGY-3, [place]

## 2022-01-27 NOTE — Telephone Encounter (Signed)
Pharmacy Patient Advocate Encounter  Insurance verification completed.    The patient is insured through [employer]   The patient is currently admitted and ran test claims for the following: Eliquis .  Copays and coinsurance results were relayed to Inpatient clinical team.  

## 2022-01-27 NOTE — Progress Notes (Addendum)
FMTS Interim Progress Note  S:Patient seen at bedside with Dr. Edd Arbour. Ms. Sammons reports palpitations and weakness and can feel that she is in atrial fibrillation. She feels weak from the diarrhea she has been having, but says she is hungry and she is requesting to eat.  O: BP 98/62 (BP Location: Right Arm)   Pulse 93   Temp 97.8 F (36.6 C)   Resp 17   Ht '5\' 3"'$  (1.6 m)   Wt 59 kg   SpO2 94%   BMI 23.04 kg/m   General: Resting comfortably in bed, converses CV: Tachycardic with irregularly irregular rhythm Abdomen: Soft, NTND, bowel sounds diminished  A/P: Atrial fibrillation w/ RVR HR in 120s-150s, telemetry reviewed Change LOC to Progressive Start diltiazem bolus/infusion once on progressive floor IV team consulted for secondary access, currently has one-lumen midline in left arm Start IV heparin and d/c LR, start NS (for Heparin compatibility)  Diarrhea with insensible GI losses Start NS @ 100 mL/hr Advance diet as tolerated (dysphagia diet)  Orvis Brill, DO 01/27/2022, 8:42 AM PGY-2, [facility] pager [phone]

## 2022-01-28 ENCOUNTER — Inpatient Hospital Stay (HOSPITAL_COMMUNITY): Payer: Medicare Other | Admitting: Anesthesiology

## 2022-01-28 ENCOUNTER — Encounter (HOSPITAL_COMMUNITY)
Admission: EM | Disposition: A | Discharge status: Home Health | Payer: Self-pay | Source: Home / Self Care | Attending: Family Medicine

## 2022-01-28 DIAGNOSIS — E46 Unspecified protein-calorie malnutrition: Secondary | ICD-10-CM

## 2022-01-28 DIAGNOSIS — I10 Essential (primary) hypertension: Secondary | ICD-10-CM

## 2022-01-28 DIAGNOSIS — J449 Chronic obstructive pulmonary disease, unspecified: Secondary | ICD-10-CM

## 2022-01-28 DIAGNOSIS — F17210 Nicotine dependence, cigarettes, uncomplicated: Secondary | ICD-10-CM

## 2022-01-28 DIAGNOSIS — I4891 Unspecified atrial fibrillation: Secondary | ICD-10-CM

## 2022-01-28 HISTORY — PX: CARDIOVERSION: SHX1299

## 2022-01-28 LAB — BASIC METABOLIC PANEL
Anion gap: 6 (ref 5–15)
BUN: 6 mg/dL — ABNORMAL LOW (ref 8–23)
CO2: 19 mmol/L — ABNORMAL LOW (ref 22–32)
Calcium: 7.6 mg/dL — ABNORMAL LOW (ref 8.9–10.3)
Chloride: 109 mmol/L (ref 98–111)
Creatinine, Ser: 0.56 mg/dL (ref 0.44–1.00)
GFR, Estimated: >60 mL/min (ref >60)
Glucose, Bld: 66 mg/dL — ABNORMAL LOW (ref 70–99)
Potassium: 3.7 mmol/L (ref 3.5–5.1)
Sodium: 134 mmol/L — ABNORMAL LOW (ref 135–145)

## 2022-01-28 LAB — CBC
HCT: 32.8 % — ABNORMAL LOW (ref 36.0–46.0)
Hemoglobin: 10.8 g/dL — ABNORMAL LOW (ref 12.0–15.0)
MCH: 31.9 pg (ref 26.0–34.0)
MCHC: 32.9 g/dL (ref 30.0–36.0)
MCV: 96.8 fL (ref 80.0–100.0)
Platelets: 152 10*3/uL (ref 150–400)
RBC: 3.39 MIL/uL — ABNORMAL LOW (ref 3.87–5.11)
RDW: 13.3 % (ref 11.5–15.5)
WBC: 6.5 10*3/uL (ref 4.0–10.5)
nRBC: 0 % (ref 0.0–0.2)

## 2022-01-28 LAB — MAGNESIUM: Magnesium: 1.8 mg/dL (ref 1.7–2.4)

## 2022-01-28 LAB — APTT: aPTT: 72 seconds — ABNORMAL HIGH (ref 24–36)

## 2022-01-28 LAB — HEPARIN LEVEL (UNFRACTIONATED): Heparin Unfractionated: 0.98 IU/mL — ABNORMAL HIGH (ref 0.30–0.70)

## 2022-01-28 SURGERY — CARDIOVERSION
Anesthesia: General

## 2022-01-28 MED ORDER — APIXABAN 5 MG PO TABS
5.0000 mg | ORAL_TABLET | Freq: Two times a day (BID) | ORAL | Status: DC
  Administered 2022-01-28 – 2022-01-29 (×3): 5 mg via ORAL
  Filled 2022-01-28 (×3): qty 1

## 2022-01-28 MED ORDER — AMIODARONE HCL 200 MG PO TABS
200.0000 mg | ORAL_TABLET | Freq: Every day | ORAL | Status: DC
  Administered 2022-01-28 – 2022-01-29 (×2): 200 mg via ORAL
  Filled 2022-01-28 (×2): qty 1

## 2022-01-28 MED ORDER — POTASSIUM CHLORIDE CRYS ER 20 MEQ PO TBCR: 40.0000 meq | EXTENDED_RELEASE_TABLET | Freq: Once | ORAL | Status: DC

## 2022-01-28 MED ORDER — SODIUM CHLORIDE 0.9 % IV BOLUS
500.0000 mL | Freq: Once | INTRAVENOUS | Status: AC
Start: 2022-01-28 — End: 2022-01-28
  Administered 2022-01-28: 500 mL via INTRAVENOUS

## 2022-01-28 MED ORDER — POTASSIUM CHLORIDE 10 MEQ/100ML IV SOLN: 10.0000 meq | INTRAVENOUS | Status: DC

## 2022-01-28 MED ORDER — MIDODRINE HCL 5 MG PO TABS
5.0000 mg | ORAL_TABLET | Freq: Three times a day (TID) | ORAL | Status: DC
  Administered 2022-01-28 – 2022-01-29 (×5): 5 mg via ORAL
  Filled 2022-01-28 (×5): qty 1

## 2022-01-28 MED ORDER — LIDOCAINE 2% (20 MG/ML) 5 ML SYRINGE
INTRAMUSCULAR | Status: DC | PRN
  Administered 2022-01-28: 40 mg via INTRAVENOUS

## 2022-01-28 MED ORDER — POTASSIUM CHLORIDE 20 MEQ PO PACK: 40.0000 meq | PACK | Freq: Once | ORAL | Status: DC

## 2022-01-28 MED ORDER — PROPOFOL 10 MG/ML IV BOLUS
INTRAVENOUS | Status: DC | PRN
  Administered 2022-01-28: 40 mg via INTRAVENOUS

## 2022-01-28 MED ORDER — POTASSIUM CHLORIDE 10 MEQ/100ML IV SOLN
10.0000 meq | INTRAVENOUS | Status: DC
  Filled 2022-01-28: qty 100

## 2022-01-28 MED ORDER — MAGNESIUM SULFATE IN D5W 1-5 GM/100ML-% IV SOLN
1.0000 g | Freq: Once | INTRAVENOUS | Status: AC
  Administered 2022-01-28: 1 g via INTRAVENOUS
  Filled 2022-01-28: qty 100

## 2022-01-28 MED ORDER — POTASSIUM CHLORIDE IN NACL 40-0.9 MEQ/L-% IV SOLN
INTRAVENOUS | Status: AC
  Filled 2022-01-28: qty 1000

## 2022-01-28 NOTE — Progress Notes (Signed)
Physical Therapy Treatment Patient Details Name: Karen Casey MRN: [id number] DOB: June 29, 1955 Today's Date: 01/28/2022   History of Present Illness 66 y.o. female presenting 8/14 with dehydration, nausea, and diarrhea. Has a fall history but cannot elaborate on it.  Pt denies being able to walk.  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    PT Comments    Pt initially not agreeable to session, but more receptive when PT explained role. Pt overall mobilizing at min-mod physical assist level, and tolerated several minutes standing EOB both statically and dynamically. At baseline pt reports being transfer level secondary to neuropathy, and will have husband to assist at home. PT will continue to progress mobility as able, pt declines OOB to chair or attempted gait training today.      Recommendations for follow up therapy are one component of a multi-disciplinary discharge planning process, led by the attending physician.  Recommendations may be updated based on patient status, additional functional criteria and insurance authorization.  Follow Up Recommendations  Home health PT (pt expressing she would rather go to OPPT but currently not at functional level to do so) Can patient physically be transported by private vehicle: Yes   Assistance Recommended at Discharge Frequent or constant Supervision/Assistance  Patient can return home with the following A little help with bathing/dressing/bathroom;Assistance with cooking/housework;Assist for transportation;Help with stairs or ramp for entrance;A little help with walking and/or transfers   Equipment Recommendations  None recommended by PT    Recommendations for Other Services       Precautions / Restrictions Precautions Precautions: Fall;Other  (comment) Restrictions Weight Bearing Restrictions: No     Mobility  Bed Mobility Overal bed mobility: Needs Assistance Bed Mobility: Supine to Sit, Sit to Supine     Supine to sit: Min assist Sit to supine: Min assist   General bed mobility comments: assist for LE progression to EOB, trunk elevation, and boost up in bed upon return to supine.    Transfers Overall transfer level: Needs assistance Equipment used: Rolling walker (2 wheels) Transfers: Sit to/from Stand Sit to Stand: Mod assist   Step pivot transfers: Min assist       General transfer comment: mod assist for power up, rise, steadying. Once standing, pt taking lateral steps L and R x2. Total standing tolerance approx 3 minutes    Ambulation/Gait               General Gait Details: lateral stepping at EOB   Stairs             Wheelchair Mobility    Modified Rankin (Stroke Patients Only)       Balance Overall balance assessment: Needs assistance, History of Falls Sitting-balance support: Feet supported Sitting balance-Leahy Scale: Good     Standing balance support: Bilateral upper extremity supported, During functional activity Standing balance-Leahy Scale: Poor Standing balance comment: reliant on external support                            Cognition Arousal/Alertness: Awake/alert Behavior During Therapy: Restless Overall Cognitive Status: No family/caregiver present to determine baseline cognitive functioning                                 General  Comments: Pt anxious and mercurial during session, at times cursing at PT and then being polite. pt expressing frustation about not being able to walk        Exercises      General Comments General comments (skin integrity, edema, etc.): Pt in afib rhythm up to 155 bpm during session      Pertinent Vitals/Pain Pain Assessment Pain Assessment: Faces Faces Pain Scale: Hurts even more Pain Location:  generalized Pain Descriptors / Indicators: Grimacing, Discomfort, Aching Pain Intervention(s): Monitored during session, Repositioned, Limited activity within patient's tolerance    Home Living                          Prior Function            PT Goals (current goals can now be found in the care plan section) Acute Rehab PT Goals Patient Stated Goal: to go home again PT Goal Formulation: With patient Time For Goal Achievement: 02/09/22 Potential to Achieve Goals: Good Progress towards PT goals: Progressing toward goals    Frequency    Min 3X/week      PT Plan Current plan remains appropriate    Co-evaluation              AM-PAC PT "6 Clicks" Mobility   Outcome Measure  Help needed turning from your back to your side while in a flat bed without using bedrails?: A Little Help needed moving from lying on your back to sitting on the side of a flat bed without using bedrails?: A Little Help needed moving to and from a bed to a chair (including a wheelchair)?: A Little Help needed standing up from a chair using your arms (e.g., wheelchair or bedside chair)?: A Little Help needed to walk in hospital room?: A Lot Help needed climbing 3-5 steps with a railing? : Total 6 Click Score: 15    End of Session   Activity Tolerance: Patient limited by fatigue Patient left: in bed;with call bell/phone within reach;with bed alarm set Nurse Communication: Mobility status PT Visit Diagnosis: History of falling (Z91.81);Other abnormalities of gait and mobility (R26.89)     Time: 9622-2979 PT Time Calculation (min) (ACUTE ONLY): 17 min  Charges:  $Therapeutic Activity: 8-22 mins                     Stacie Glaze, PT DPT Acute Rehabilitation Services Pager [phone]  Office [phone]    Roxine Caddy E Ruffin Pyo 01/28/2022, 3:01 PM

## 2022-01-28 NOTE — Anesthesia Procedure Notes (Signed)
Procedure Name: General with mask airway Date/Time: 01/28/2022 2:48 PM  Performed by: Imagene Riches, CRNAPre-anesthesia Checklist: Patient identified, Emergency Drugs available, Suction available, Patient being monitored and Timeout performed Patient Re-evaluated:Patient Re-evaluated prior to induction Oxygen Delivery Method: Ambu bag Preoxygenation: Pre-oxygenation with 100% oxygen

## 2022-01-28 NOTE — Assessment & Plan Note (Signed)
Chronic, present on admission. Albumin 2.4. Consider nutrition consult to address chronic malnutrition once no longer NPO - nutrition consult

## 2022-01-28 NOTE — Progress Notes (Signed)
Subjective:  States she feels fine and would lime to go home when stable. Denies chest pain or dyspnea  Intake/Output from previous day:  I/O last 3 completed shifts: In: 3747.5 [I.V.:2797.5; IV Piggyback:950] Out: -  No intake/output data recorded. Net IO Since Admission: 3,747.52 mL [01/28/22 0632]  Blood pressure (!) 84/61, pulse (!) 116, temperature (!) 97.4 F (36.3 C), temperature source Axillary, resp. rate 18, height '5\' 3"'  (1.6 m), weight 59 kg, SpO2 96 %. Physical Exam Neck:     Vascular: No carotid bruit or JVD.  Cardiovascular:     Rate and Rhythm: Normal rate. Rhythm irregular.     Pulses: Normal pulses and intact distal pulses.     Heart sounds: No murmur heard. Pulmonary:     Effort: Pulmonary effort is normal.     Breath sounds: Normal breath sounds.  Abdominal:     General: Bowel sounds are normal.     Palpations: Abdomen is soft.  Musculoskeletal:     Right lower leg: No edema.     Left lower leg: No edema.  Skin:    Capillary Refill: Capillary refill takes less than 2 seconds.     Lab Results: BMP BNP (last 3 results) Recent Labs    11/24/21 0240  BNP 353.7*    ProBNP (last 3 results) No results for input(s): "PROBNP" in the last 8760 hours.    Latest Ref Rng & Units 01/27/2022    3:36 AM 01/26/2022    3:50 AM 01/25/2022    1:26 PM  BMP  Glucose 70 - 99 mg/dL 104  109  91   BUN 8 - 23 mg/dL '5  6  5   ' Creatinine 0.44 - 1.00 mg/dL 0.56  0.72  0.64   Sodium 135 - 145 mmol/L 131  137  135   Potassium 3.5 - 5.1 mmol/L 3.0  2.8  3.8   Chloride 98 - 111 mmol/L 101  98  102   CO2 22 - 32 mmol/L '23  27  22   ' Calcium 8.9 - 10.3 mg/dL 7.9  8.5  8.4       Latest Ref Rng & Units 01/25/2022    1:26 PM 01/04/2022    4:55 AM 01/03/2022    9:03 AM  Hepatic Function  Total Protein 6.5 - 8.1 g/dL 5.5  4.4  4.5   Albumin 3.5 - 5.0 g/dL 2.4  1.8  1.8   AST 15 - 41 U/L 44  19  21   ALT 0 - 44 U/L '25  16  17   ' Alk Phosphatase 38 - 126 U/L 93  85  87   Total  Bilirubin 0.3 - 1.2 mg/dL 0.6  0.5  0.7       Latest Ref Rng & Units 01/27/2022    3:36 AM 01/26/2022    3:50 AM 01/25/2022   11:21 AM  CBC  WBC 4.0 - 10.5 K/uL 16.6  14.2  6.4   Hemoglobin 12.0 - 15.0 g/dL 12.3  13.2  15.2   Hematocrit 36.0 - 46.0 % 36.7  37.8  44.7   Platelets 150 - 400 K/uL 195  301  351    Lab Results  Component Value Date   CHOL 97 09/17/2020   HDL 41 09/17/2020   LDLCALC 42 09/17/2020   LDLDIRECT 58 12/18/2013   TRIG 68 09/17/2020   CHOLHDL 2.4 09/17/2020     HEMOGLOBIN A1C Lab Results  Component Value Date   HGBA1C 5.2 09/17/2020  MPG 102.54 09/17/2020   TSH Recent Labs    11/27/21 2032  TSH 6.711*   Imaging: Imaging results have been reviewed  Cardiac Studies:  EKG: EKG 01/28/2022: Atrial fibrillation with controlled ventricular response, normal axis, nonspecific ST-T abnormality.  . Echocardiogram 01/06/2022: 1. Left ventricular ejection fraction by 3D volume is 63 %. The left ventricle has normal function. The left ventricle has no regional wall motion abnormalities. Left ventricular diastolic parameters are indeterminate.  2. Right ventricular systolic function is normal. The right ventricular size is normal. There is normal pulmonary artery systolic pressure. The estimated right ventricular systolic pressure is 40.3 mmHg.  3. Left atrial size was mildly dilated.  4. The mitral valve is normal in structure. Mild mitral valve regurgitation. No evidence of mitral stenosis.  5. The aortic valve is tricuspid. There is mild calcification of the aortic valve. Aortic valve regurgitation is not visualized. No aortic stenosis is present.  6. The inferior vena cava is normal in size with greater than 50% respiratory variability, suggesting right atrial pressure of 3 mmHg.  7. Rhythm strip during this exam demonstrates premature atrial contractions.   Scheduled Meds:  fluticasone furoate-vilanterol  1 puff Inhalation Daily   And   umeclidinium  bromide  1 puff Inhalation Daily   folic acid  1 mg Oral Daily   gabapentin  600 mg Oral TID   Gerhardt's butt cream   Topical BID   multivitamin with minerals  1 tablet Oral Daily   nicotine  14 mg Transdermal Daily   nortriptyline  75 mg Oral QHS   pantoprazole  40 mg Oral q morning   sodium chloride flush  10-40 mL Intracatheter Q12H   Continuous Infusions:  sodium chloride 100 mL/hr at 01/27/22 2022   amiodarone 30 mg/hr (01/28/22 0134)   heparin 900 Units/hr (01/28/22 0014)   PRN Meds:.albuterol, famotidine, Gerhardt's butt cream, hydrOXYzine, loperamide, melatonin, ondansetron, sodium chloride flush, white petrolatum  Assessment   Persistent atrial fib with RVR  Plan:   Presently on IV amiodarone and still HR is not well controlled but improved. Best option is to proceed with cardioversion. BP is still soft. Hopefully with cardioversion BP may improve. She has been abstinent from alcohol for a year and does not want anyone to keep asking her about alcohol.     Adrian Prows, MD, [facility] 01/28/2022, 6:32 AM Office: [phone] Fax: [phone] Pager: [phone]

## 2022-01-28 NOTE — Progress Notes (Signed)
SLP Cancellation Note  Patient Details Name: Karen Casey MRN: [id number] DOB: [birth year]   Cancelled treatment:       Reason Eval/Treat Not Completed: Other (comment) (Patient NPO for cardioversion. SLP will f/u when able to be back on PO's.)  Sonia Baller, MA, CCC-SLP Speech Therapy

## 2022-01-28 NOTE — Progress Notes (Signed)
ANTICOAGULATION CONSULT NOTE - Initial Consult  Pharmacy Consult for Heparin Indication: atrial fibrillation  Allergies  Allergen Reactions   Capsaicin-Menthol Rash and Other (See Comments)    Burning and peeling on area applied to   Diclo Gel [Diclofenac Sodium] Rash and Other (See Comments)    Burning and peeling at the sight of application.    Patient Measurements: Height: '5\' 3"'$  (160 cm) Weight: 59 kg (130 lb 1.1 oz) IBW/kg (Calculated) : 52.4 Heparin Dosing Weight: 59 kg  Vital Signs: Temp: 97.4 F (36.3 C) (08/17 0416) Temp Source: Axillary (08/17 0416) BP: 90/73 (08/17 0600) Pulse Rate: 116 (08/17 0416)  Labs: Recent Labs    01/26/22 0350 01/27/22 0336 01/28/22 0805  HGB 13.2 12.3 10.8*  HCT 37.8 36.7 32.8*  PLT 301 195 152  APTT  --   --  72*  HEPARINUNFRC  --   --  0.98*  CREATININE 0.72 0.56 0.56     Estimated Creatinine Clearance: 59.5 mL/min (by C-G formula based on SCr of 0.56 mg/dL).   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 Patient received 1 dose of 5 mg at 1007 AM. Pharmacy consult to transition back to IV Heparin in case  of potential cardioversion.   Heparin level 0.98 is supratherapeutic but affected by apixaban dose; aPTT is 72 and at goal on 900 units/hr. H/ H and plt down trending but patient is on Ns at 179m/hr. No signs of bleeding.   Goal of Therapy:  Heparin level 0.3-0.7 units/ml Monitor platelets by anticoagulation protocol: Yes   Plan:  Continue Heparin 900 units/hour  F/u aPTT until correlates with heparin level  Daily Heparin level, aPTT, and CBC while on therapy Monitor for signs/symptoms of bleeding   LBenetta Spar PharmD, BCPS, BCCP Clinical Pharmacist  Please check AMION for all MBasilephone numbers After 10:00 PM, call [place]

## 2022-01-28 NOTE — Interval H&P Note (Signed)
History and Physical Interval Note:  01/28/2022 2:35 PM  Karen Casey  has presented today for surgery, with the diagnosis of afib/aflutter.  The various methods of treatment have been discussed with the patient and family. After consideration of risks, benefits and other options for treatment, the patient has consented to  Procedure(s): CARDIOVERSION (N/A) as a surgical intervention.  The patient's history has been reviewed, patient examined, no change in status, stable for surgery.  I have reviewed the patient's chart and labs.  Questions were answered to the patient's satisfaction.   Patient on IV heparin, previously not on anticoagulants. Chads Vasc is very low at 1 and hence do not think she needs TEE. Will cardiovert and switch to Eliquis prior to discharge.   Adrian Prows

## 2022-01-28 NOTE — CV Procedure (Signed)
Direct current cardioversion 01/28/2022 2:51 PM  Indication symptomatic A. Fibrillation.  Procedure: Using 40 mg of IV Propofol and 40 IV Lidocaine (for reducing venous pain) for achieving deep sedation, synchronized direct current cardioversion performed. Patient was delivered with 120 Joules of electricity X 1 with success to NSR. Patient tolerated the procedure well. No immediate complication noted.      Adrian Prows, MD, [facility] 01/28/2022, 2:51 PM Office: [phone] Fax: [phone] Pager: [phone]

## 2022-01-28 NOTE — Anesthesia Preprocedure Evaluation (Addendum)
Anesthesia Evaluation  Patient identified by MRN, date of birth, ID band Patient awake    Reviewed: Allergy & Precautions, NPO status , Patient's Chart, lab work & pertinent test results  Airway Mallampati: III  TM Distance: >3 FB Neck ROM: Full    Dental  (+) Poor Dentition, Dental Advisory Given, Missing   Pulmonary shortness of breath, COPD, Current Smoker and Patient abstained from smoking.,    Pulmonary exam normal breath sounds clear to auscultation       Cardiovascular hypertension, Normal cardiovascular exam+ dysrhythmias Atrial Fibrillation  Rhythm:Regular Rate:Normal  TTE 2023 1. Left ventricular ejection fraction by 3D volume is 63 %. The left  ventricle has normal function. The left ventricle has no regional wall  motion abnormalities. Left ventricular diastolic parameters are  indeterminate.  2. Right ventricular systolic function is normal. The right ventricular  size is normal. There is normal pulmonary artery systolic pressure. The  estimated right ventricular systolic pressure is 21.3 mmHg.  3. Left atrial size was mildly dilated.  4. The mitral valve is normal in structure. Mild mitral valve  regurgitation. No evidence of mitral stenosis.  5. The aortic valve is tricuspid. There is mild calcification of the  aortic valve. Aortic valve regurgitation is not visualized. No aortic  stenosis is present.  6. The inferior vena cava is normal in size with greater than 50%  respiratory variability, suggesting right atrial pressure of 3 mmHg.  7. Rhythm strip during this exam demonstrates premature atrial  contractions.    Neuro/Psych  Headaches, Seizures -,  PSYCHIATRIC DISORDERS Anxiety Depression    GI/Hepatic GERD  ,(+)     substance abuse  alcohol use,   Endo/Other  negative endocrine ROS  Renal/GU negative Renal ROS  negative genitourinary   Musculoskeletal negative musculoskeletal ROS (+)    Abdominal   Peds  Hematology negative hematology ROS (+)   Anesthesia Other Findings 66 y.o. female with h/o hypertension, COPD, anxiety, h/o alcohol abuse, PAF.  Admitted with nausea, vomiting, diarrhea.  Reproductive/Obstetrics                           Anesthesia Physical Anesthesia Plan  ASA: 3  Anesthesia Plan: General   Post-op Pain Management:    Induction: Intravenous  PONV Risk Score and Plan: 2 and Propofol infusion and Treatment may vary due to age or medical condition  Airway Management Planned: Natural Airway  Additional Equipment:   Intra-op Plan:   Post-operative Plan:   Informed Consent: I have reviewed the patients History and Physical, chart, labs and discussed the procedure including the risks, benefits and alternatives for the proposed anesthesia with the patient or authorized representative who has indicated his/her understanding and acceptance.     Dental advisory given  Plan Discussed with: CRNA  Anesthesia Plan Comments:         Anesthesia Quick Evaluation

## 2022-01-28 NOTE — Transfer of Care (Signed)
Immediate Anesthesia Transfer of Care Note  Patient: Karen Casey  Procedure(s) Performed: CARDIOVERSION  Patient Location: Endoscopy Unit  Anesthesia Type:General  Level of Consciousness: awake  Airway & Oxygen Therapy: Patient Spontanous Breathing  Post-op Assessment: Report given to RN and Post -op Vital signs reviewed and stable  Post vital signs: Reviewed and stable  Last Vitals:  Vitals Value Taken Time  BP 129/113   Temp    Pulse 74   Resp    SpO2 95     Last Pain:  Vitals:   01/28/22 1340  TempSrc:   PainSc: 0-No pain         Complications: No notable events documented.

## 2022-01-28 NOTE — Progress Notes (Addendum)
[facility] Infectious Diseases Follow Up Note  Patient Identification: Patient Name: Karen Casey MRN: [id number] [place] Date: 01/25/2022 10:50 AM Age: 66 y.o.Today's Date: 01/28/2022  Reason for Visit: Follow up on acute diarrhea   Active Problems:   Nausea vomiting and diarrhea   Alcohol use   Anxiety disorder   Oral candidiasis   HTN (hypertension)   Paroxysmal atrial fibrillation (HCC)   COPD (chronic obstructive pulmonary disease) ([place])   Bacteremia due to Enterococcus   Atrial fibrillation with RVR (HCC)  Antibiotics: None   Lines/Hardwares:   Interval Events: Remains afebrile, diarrhea has resolved since 12 pm yesterday per patient. Soft BP and tachycardic  Assessment [name] with PMH of alcohol/tobacco abuse, COPD, Anxiety/Depression, GERD, HTN, Seizure d/o, PAF, Peripheral neuropathy?Neuromuscular disease/ambulatory dysfunction, recent hospital admission for acute enterocolitis and E avium in blood cultures admitted with acute gastroenteritis. HIV NR. Diarrhea seems to have been improving with decreasing frequency. Of note, recently completed  ? Po augmentin for E avium bacteremia  Acute diarrhea with nausea, vomiting - has resolved per patient. Likely viral enteritis vs related ot recent antibiotic use  Volume depletion/Dehydration 2/2 above - on IVF, She is NPO for possible cardioversion   A Fib w RVR  - amiodarone and heparin drip, Cardiology following for possible need of cardioversion   Leukocytosis - resolved . Blood cx 8/14 NG in 3 days   Recommendations No new recommendations from ID standpoint ID will sign off, please call with questions   Rest of the management as per the primary team. Thank you for the consult. Please page with pertinent questions or concerns.  ______________________________________________________________________ Subjective patient seen and examined at the bedside.   Shifted to telemetry No diarrhea since 12 yesterday Denies nausea, vomiting and abdominal pain   Vitals BP 90/73   Pulse (!) 116   Temp (!) 97.4 F (36.3 C) (Axillary)   Resp 15   Ht '5\' 3"'$  (1.6 m)   Wt 59 kg   SpO2 95%   BMI 23.04 kg/m     Physical Exam Constitutional:  lying in the bed and appears irritable     Comments:   Cardiovascular:     Rate and Rhythm: Normal rate  and Irregular rhythm    Heart sounds:   Pulmonary:     Effort: Pulmonary effort is normal on room air     Comments:   Abdominal:     Palpations: Abdomen is soft.     Tenderness: non distended and non tender   Musculoskeletal:        General: No swelling or tenderness in peripheral joints    Skin:    Comments:   Neurological:     General: Grossly non focal, awake, alert and oriented   Pertinent Microbiology Results for orders placed or performed during the hospital encounter of 01/25/22  Urine Culture     Status: Abnormal   Collection Time: 01/25/22 11:26 AM   Specimen: Urine, Clean Catch  Result Value Ref Range Status   Specimen Description URINE, CLEAN CATCH  Final   Special Requests   Final    NONE Performed at [facility], [place] [address], [place], [place] [address]    Culture (A)  Final    30,000 COLONIES/mL MULTIPLE SPECIES PRESENT, SUGGEST RECOLLECTION   Report Status 01/26/2022 FINAL  Final  C Difficile Quick Screen w PCR reflex     Status: None   Collection Time: 01/25/22 11:27 AM   Specimen:  STOOL  Result Value Ref Range Status   C Diff antigen NEGATIVE NEGATIVE Final   C Diff toxin NEGATIVE NEGATIVE Final   C Diff interpretation No C. difficile detected.  Final    Comment: Performed at [facility], [place] [address], [place], [place] [address]  Culture, blood (routine x 2)     Status: None (Preliminary result)   Collection Time: 01/25/22 11:29 AM   Specimen: BLOOD RIGHT HAND  Result Value Ref Range Status   Specimen Description BLOOD RIGHT HAND  Final    Special Requests   Final    BOTTLES DRAWN AEROBIC AND ANAEROBIC Blood Culture results may not be optimal due to an inadequate volume of blood received in culture bottles   Culture   Final    NO GROWTH 3 DAYS Performed at [facility], [place] [address], [place], [place] [address]    Report Status PENDING  Incomplete  Culture, blood (routine x 2)     Status: None (Preliminary result)   Collection Time: 01/25/22 12:13 PM   Specimen: BLOOD  Result Value Ref Range Status   Specimen Description BLOOD SITE NOT SPECIFIED  Final   Special Requests   Final    BOTTLES DRAWN AEROBIC ONLY Blood Culture results may not be optimal due to an inadequate volume of blood received in culture bottles   Culture   Final    NO GROWTH 3 DAYS Performed at [facility], [place] [address], [place], [place] [address]    Report Status PENDING  Incomplete    Pertinent Lab.    Latest Ref Rng & Units 01/28/2022    8:05 AM 01/27/2022    3:36 AM 01/26/2022    3:50 AM  CBC  WBC 4.0 - 10.5 K/uL 6.5  16.6  14.2   Hemoglobin 12.0 - 15.0 g/dL 10.8  12.3  13.2   Hematocrit 36.0 - 46.0 % 32.8  36.7  37.8   Platelets 150 - 400 K/uL 152  195  301       Latest Ref Rng & Units 01/28/2022    8:05 AM 01/27/2022    3:36 AM 01/26/2022    3:50 AM  CMP  Glucose 70 - 99 mg/dL 66  104  109   BUN 8 - 23 mg/dL '6  5  6   '$ Creatinine 0.44 - 1.00 mg/dL 0.56  0.56  0.72   Sodium 135 - 145 mmol/L 134  131  137   Potassium 3.5 - 5.1 mmol/L 3.7  3.0  2.8   Chloride 98 - 111 mmol/L 109  101  98   CO2 22 - 32 mmol/L '19  23  27   '$ Calcium 8.9 - 10.3 mg/dL 7.6  7.9  8.5     Pertinent Imaging today Plain films and CT images have been personally visualized and interpreted; radiology reports have been reviewed. Decision making incorporated into the Impression / Recommendations.  CT ABDOMEN PELVIS W CONTRAST  Result Date: 01/26/2022 CLINICAL DATA:  Bowel obstruction suspected. EXAM: CT ABDOMEN AND PELVIS WITH CONTRAST TECHNIQUE:  Multidetector CT imaging of the abdomen and pelvis was performed using the standard protocol following bolus administration of intravenous contrast. RADIATION DOSE REDUCTION: This exam was performed according to the departmental dose-optimization program which includes automated exposure control, adjustment of the mA and/or kV according to patient size and/or use of iterative reconstruction technique. CONTRAST:  115m OMNIPAQUE IOHEXOL 300 MG/ML  SOLN COMPARISON:  01/02/2022. FINDINGS: Lower chest: Atelectasis  is present at the lung bases. There is a trace right pleural effusion. Hepatobiliary: 7 mm hypodensity is present in the left lobe of the liver which is too small to further characterize. Diffuse fatty infiltration of the liver is noted. The gallbladder is without stones. No biliary ductal dilatation. Pancreas: Unremarkable. No pancreatic ductal dilatation or surrounding inflammatory changes. Spleen: Normal in size without focal abnormality. Adrenals/Urinary Tract: The adrenal glands are within normal limits. The kidneys enhance symmetrically. Subcentimeter hypodensities are noted in the upper pole the left kidney which are too small to further characterize. No renal calculus or hydronephrosis. The bladder is unremarkable. Stomach/Bowel: There is marked thickening of the walls of the distal esophagus. Gastric wall thickening is noted. There is a small hiatal hernia. Bowel wall thickening with mucosal enhancement is noted at the duodenum and jejunum. No bowel obstruction, free air, or pneumatosis. There is colonic wall thickening involving the ascending and proximal transverse colon with surrounding fat stranding. The appendix is not visualized on exam. Vascular/Lymphatic: Aortic atherosclerosis. No enlarged abdominal or pelvic lymph nodes. Reproductive: Uterus and bilateral adnexa are unremarkable. Other: Free air. Musculoskeletal: Old healed rib fractures are noted on the right. Degenerative changes are  present in the thoracolumbar spine. No acute osseous abnormality. IMPRESSION: 1. Bowel wall thickening with mucosal enhancement involving the duodenum and jejunum. Colonic wall thickening with fat stranding involving the ascending colon and proximal transverse colon, suggesting enterocolitis, not significantly changed from the prior exam. No bowel obstruction or free air. 2. Gastric wall thickening with enhancement compatible with gastritis. There is a small hiatal hernia with marked thickening of the walls of the distal esophagus. Endoscopy is recommended for further evaluation on follow-up. 3. Hepatic steatosis. 4. Aortic atherosclerosis. Electronically Signed   By: Brett Fairy M.D.   On: 01/26/2022 21:46   DG Abd 2 Views  Result Date: 01/26/2022 CLINICAL DATA:  Nausea, vomiting and abdominal pain. EXAM: ABDOMEN - 2 VIEW COMPARISON:  CT AP 01/02/2022 FINDINGS: Multiple dilated loops of small bowel are identified which measure up to 4 cm. On the decubitus radiograph there are small bowel air-fluid levels. No evidence for pneumoperitoneum. Multiple subacute to chronic right anterior rib fracture deformities are noted. IMPRESSION: 1. Findings compatible with small bowel obstruction versus small bowel ileus. No evidence for pneumoperitoneum. 2. Multiple subacute to chronic right anterior rib fracture deformities. Electronically Signed   By: Kerby Moors M.D.   On: 01/26/2022 12:00     I spent 51 minutes for this patient encounter including review of prior medical records, coordination of care with primary/other specialist with greater than 50% of time being face to face/counseling and discussing diagnostics/treatment plan with the patient/family.  Electronically signed by:   Rosiland Oz, MD Infectious Disease Physician [facility] Pager: [phone]

## 2022-01-28 NOTE — Progress Notes (Signed)
TOC following patient for high risk for readmission.  Spoke with patient that is very anxious about her Cardioversion this afternoon and requested that TOC reach out to her tomorrow. Confirmed with amy that Patient active with [facility] PT and OT.  Patient lives with her Significant other. Spoke with patient that is very anxious about her Cardioversion this afternoon and requested that TOC reach out to her tomorrow.

## 2022-01-28 NOTE — H&P (View-Only) (Signed)
Subjective:  States she feels fine and would lime to go home when stable. Denies chest pain or dyspnea  Intake/Output from previous day:  I/O last 3 completed shifts: In: 3747.5 [I.V.:2797.5; IV Piggyback:950] Out: -  No intake/output data recorded. Net IO Since Admission: 3,747.52 mL [01/28/22 0632]  Blood pressure (!) 84/61, pulse (!) 116, temperature (!) 97.4 F (36.3 C), temperature source Axillary, resp. rate 18, height '5\' 3"'  (1.6 m), weight 59 kg, SpO2 96 %. Physical Exam Neck:     Vascular: No carotid bruit or JVD.  Cardiovascular:     Rate and Rhythm: Normal rate. Rhythm irregular.     Pulses: Normal pulses and intact distal pulses.     Heart sounds: No murmur heard. Pulmonary:     Effort: Pulmonary effort is normal.     Breath sounds: Normal breath sounds.  Abdominal:     General: Bowel sounds are normal.     Palpations: Abdomen is soft.  Musculoskeletal:     Right lower leg: No edema.     Left lower leg: No edema.  Skin:    Capillary Refill: Capillary refill takes less than 2 seconds.     Lab Results: BMP BNP (last 3 results) Recent Labs    11/24/21 0240  BNP 353.7*    ProBNP (last 3 results) No results for input(s): "PROBNP" in the last 8760 hours.    Latest Ref Rng & Units 01/27/2022    3:36 AM 01/26/2022    3:50 AM 01/25/2022    1:26 PM  BMP  Glucose 70 - 99 mg/dL 104  109  91   BUN 8 - 23 mg/dL '5  6  5   ' Creatinine 0.44 - 1.00 mg/dL 0.56  0.72  0.64   Sodium 135 - 145 mmol/L 131  137  135   Potassium 3.5 - 5.1 mmol/L 3.0  2.8  3.8   Chloride 98 - 111 mmol/L 101  98  102   CO2 22 - 32 mmol/L '23  27  22   ' Calcium 8.9 - 10.3 mg/dL 7.9  8.5  8.4       Latest Ref Rng & Units 01/25/2022    1:26 PM 01/04/2022    4:55 AM 01/03/2022    9:03 AM  Hepatic Function  Total Protein 6.5 - 8.1 g/dL 5.5  4.4  4.5   Albumin 3.5 - 5.0 g/dL 2.4  1.8  1.8   AST 15 - 41 U/L 44  19  21   ALT 0 - 44 U/L '25  16  17   ' Alk Phosphatase 38 - 126 U/L 93  85  87   Total  Bilirubin 0.3 - 1.2 mg/dL 0.6  0.5  0.7       Latest Ref Rng & Units 01/27/2022    3:36 AM 01/26/2022    3:50 AM 01/25/2022   11:21 AM  CBC  WBC 4.0 - 10.5 K/uL 16.6  14.2  6.4   Hemoglobin 12.0 - 15.0 g/dL 12.3  13.2  15.2   Hematocrit 36.0 - 46.0 % 36.7  37.8  44.7   Platelets 150 - 400 K/uL 195  301  351    Lab Results  Component Value Date   CHOL 97 09/17/2020   HDL 41 09/17/2020   LDLCALC 42 09/17/2020   LDLDIRECT 58 12/18/2013   TRIG 68 09/17/2020   CHOLHDL 2.4 09/17/2020     HEMOGLOBIN A1C Lab Results  Component Value Date   HGBA1C 5.2 09/17/2020  MPG 102.54 09/17/2020   TSH Recent Labs    11/27/21 2032  TSH 6.711*   Imaging: Imaging results have been reviewed  Cardiac Studies:  EKG: EKG 01/28/2022: Atrial fibrillation with controlled ventricular response, normal axis, nonspecific ST-T abnormality.  . Echocardiogram 01/06/2022: 1. Left ventricular ejection fraction by 3D volume is 63 %. The left ventricle has normal function. The left ventricle has no regional wall motion abnormalities. Left ventricular diastolic parameters are indeterminate.  2. Right ventricular systolic function is normal. The right ventricular size is normal. There is normal pulmonary artery systolic pressure. The estimated right ventricular systolic pressure is 78.4 mmHg.  3. Left atrial size was mildly dilated.  4. The mitral valve is normal in structure. Mild mitral valve regurgitation. No evidence of mitral stenosis.  5. The aortic valve is tricuspid. There is mild calcification of the aortic valve. Aortic valve regurgitation is not visualized. No aortic stenosis is present.  6. The inferior vena cava is normal in size with greater than 50% respiratory variability, suggesting right atrial pressure of 3 mmHg.  7. Rhythm strip during this exam demonstrates premature atrial contractions.   Scheduled Meds:  fluticasone furoate-vilanterol  1 puff Inhalation Daily   And   umeclidinium  bromide  1 puff Inhalation Daily   folic acid  1 mg Oral Daily   gabapentin  600 mg Oral TID   Gerhardt's butt cream   Topical BID   multivitamin with minerals  1 tablet Oral Daily   nicotine  14 mg Transdermal Daily   nortriptyline  75 mg Oral QHS   pantoprazole  40 mg Oral q morning   sodium chloride flush  10-40 mL Intracatheter Q12H   Continuous Infusions:  sodium chloride 100 mL/hr at 01/27/22 2022   amiodarone 30 mg/hr (01/28/22 0134)   heparin 900 Units/hr (01/28/22 0014)   PRN Meds:.albuterol, famotidine, Gerhardt's butt cream, hydrOXYzine, loperamide, melatonin, ondansetron, sodium chloride flush, white petrolatum  Assessment   Persistent atrial fib with RVR  Plan:   Presently on IV amiodarone and still HR is not well controlled but improved. Best option is to proceed with cardioversion. BP is still soft. Hopefully with cardioversion BP may improve. She has been abstinent from alcohol for a year and does not want anyone to keep asking her about alcohol.     Adrian Prows, MD, [facility] 01/28/2022, 6:32 AM Office: [phone] Fax: [phone] Pager: [phone]

## 2022-01-28 NOTE — Progress Notes (Addendum)
Daily Progress Note Intern Pager: [phone]  Patient name: Karen Casey Medical record number: [id number] Date of birth: [birth year] Age: 66 y.o. Gender: female  Primary Care Provider: [name], MD Consultants: ID, cardiology Code Status: FULL  Pt Overview and Major Events to Date:  - 8/14 admitted for vomiting, diarrhea. ID consulted for concerns of inadequately treated enterococcus bacteremia - 8/15-8/16 a-fib with RVR, transferred to progressive  Assessment and Plan:  Karen Casey is a 66 y.o. female who presented with recurrent enterocolitis, stay complicated by a-fib with RVR, now being managed medically, with cardioversion scheduled this afternoon. Relevant PMHx includes recent history of enterococcus bacteremia treated with linezolid and lost to follow-up with ID, GERD, a-fib  Atrial fibrillation with RVR ([place]) On 8/15 had developed a-fib with, placed on metoprolol. Hx untreated paroxysmal afib, also worsened in the setting of dehydration. Per cardiology, no diltiazem necessary. 8/17 stopped metoprolol, started amiodarone and heparin. Cardiology will do cardioversion today - amiodarone drip - heparin drip - stopped apixaban - stopped metoprolol - f/u cardioversion - f/u cards recs  Enterocolitis Improving. D/c on 7/27 for enterocolitis + enterococcus bacteremia, presented this admission for n/v/d. This presentation likely due to recurrence of enterocolitis, inadequate abx treatment. C. difficile negative without known infectious source. Hx GERD. ID consulted. ID ordered stool culture and GI PCR panel. WBC down from 16.6 to 6.5, no emesis, diarrhea since yesterday morning - admitted to [facility] med-surg, attending Dr. Nori Riis - appreciate ID recs - LR 150 MLS/hour (EF 63%) - Loperamide (Imodium) for diarrhea - Pantoprazole (Protonix) 40 mg daily - famotidine (Pepcid) PRN for heartburn - on ondansetron (Zofran) for n/v - monitor fluid status - f/u stool  culture, GI PCR panel  Hypokalemia K 3.0, Mg 1.5. In the setting of acute vomiting and diarrhea, kidney function not affected - Continue LR 150 mL/hr (EF 63%) - Zofran PRN (watch Qtc) - Recheck BMP in AM  Bacteremia due to Enterococcus H/o recent bacteremia with enterococcus avium, blood cx obtained in ED. Sent out on linezolid x2 weeks but unsure if completed - per pharmacy notes, was denied coverage. Also lost to ID f/u. TTE last admission negative for endocarditis. - ID following, appreciate recs - f/u blood cx  Anxiety disorder Patient's sister reports of concern regarding severe anxiety, s/p ativan x1 dose in ED with history of chronic opioid use but has since been completely weaned. Stopped buspar last admission due to risk of serotonin syndrome with Zyvox  - Continue nortriptyline home dose - started on hydroxyzine PRN TID  HTN (hypertension) Home amodipine held. Now hypotensive, started on midodrine TID and increased IV fluids from 100 to 150 mL/hr - IV fluids 150 mL/hr - amiodarone held - start midodrine 5 mg TID  COPD (chronic obstructive pulmonary disease) ([place]) Continue with home albuterol PRN and equivalent to Trelegy.   Paroxysmal atrial fibrillation (HCC) Not on chronic anticoagulation per outpatient providers, regular on exam. S/p cardizem x1 dose. Per cardiology, no diltiazem necessary, stop heparin - give metoprolol BID - continuous monitoring  Alcohol use Denies recent use without evidence of withdrawal. Pt states she achieved prolonged sobriety but cannot state for how long - CIWAs  - continue with folate home dose  - MVM   Unspecified protein-calorie malnutrition (HCC) Chronic, present on admission. Albumin 2.4. Consider nutrition consult to address chronic malnutrition once no longer NPO - nutrition consult   Oral candidiasis Noted on last admission but not mentioned during admission. Treated previously  with Fluconazole, will recheck tomorrow for  persistence and treat if indicated    FEN/GI: pantoprazole, famotidine PRN, loperamide PRN, ondansetron PRN PPx: heparin Dispo: likely d/c tomorrow, pending cardioversion  Subjective:  Patient feels very anxious about potential cardioversion today. She states last vomiting and diarrheal episode was yesterday morning.  Objective: Temp:  [97.4 F (36.3 C)-97.5 F (36.4 C)] 97.4 F (36.3 C) (08/17 0416) Pulse Rate:  [87-124] 106 (08/17 0858) Resp:  [15-28] 18 (08/17 0858) BP: (67-121)/(47-107) 99/86 (08/17 0858) SpO2:  [79 %-100 %] 95 % (08/17 0858)  Physical Exam: General: well-appearing and in no acute distress HEENT: normocephalic and atraumatic Cardiovascular:  irregular, tachycardic Respiratory: CTAB anteriorly, normal respiratory effort, and on RA Gastrointestinal: non-tender and non-distended Extremities: moving all extremities spontaneously Neuro: following commands and no focal neurological deficits   Laboratory: Most recent CBC Lab Results  Component Value Date   WBC 6.5 01/28/2022   HGB 10.8 (L) 01/28/2022   HCT 32.8 (L) 01/28/2022   MCV 96.8 01/28/2022   PLT 152 01/28/2022   Most recent BMP    Latest Ref Rng & Units 01/28/2022    8:05 AM  BMP  Glucose 70 - 99 mg/dL 66   BUN 8 - 23 mg/dL 6   Creatinine 0.44 - 1.00 mg/dL 0.56   Sodium 135 - 145 mmol/L 134   Potassium 3.5 - 5.1 mmol/L 3.7   Chloride 98 - 111 mmol/L 109   CO2 22 - 32 mmol/L 19   Calcium 8.9 - 10.3 mg/dL 7.6     Camelia Phenes, MD 01/28/2022, 12:04 PM  PGY-1, [place] Intern pager: [phone], text pages welcome Secure chat group [place]

## 2022-01-29 ENCOUNTER — Other Ambulatory Visit (HOSPITAL_COMMUNITY): Payer: Self-pay

## 2022-01-29 DIAGNOSIS — B952 Enterococcus as the cause of diseases classified elsewhere: Secondary | ICD-10-CM

## 2022-01-29 DIAGNOSIS — R1310 Dysphagia, unspecified: Secondary | ICD-10-CM

## 2022-01-29 DIAGNOSIS — R7881 Bacteremia: Secondary | ICD-10-CM

## 2022-01-29 LAB — BASIC METABOLIC PANEL
Anion gap: 4 — ABNORMAL LOW (ref 5–15)
BUN: <5 mg/dL — ABNORMAL LOW (ref 8–23)
CO2: 21 mmol/L — ABNORMAL LOW (ref 22–32)
Calcium: 7.9 mg/dL — ABNORMAL LOW (ref 8.9–10.3)
Chloride: 109 mmol/L (ref 98–111)
Creatinine, Ser: 0.72 mg/dL (ref 0.44–1.00)
GFR, Estimated: >60 mL/min (ref >60)
Glucose, Bld: 74 mg/dL (ref 70–99)
Potassium: 4.3 mmol/L (ref 3.5–5.1)
Sodium: 134 mmol/L — ABNORMAL LOW (ref 135–145)

## 2022-01-29 LAB — CBC
HCT: 29.3 % — ABNORMAL LOW (ref 36.0–46.0)
Hemoglobin: 9.6 g/dL — ABNORMAL LOW (ref 12.0–15.0)
MCH: 32.1 pg (ref 26.0–34.0)
MCHC: 32.8 g/dL (ref 30.0–36.0)
MCV: 98 fL (ref 80.0–100.0)
Platelets: 148 10*3/uL — ABNORMAL LOW (ref 150–400)
RBC: 2.99 MIL/uL — ABNORMAL LOW (ref 3.87–5.11)
RDW: 13.3 % (ref 11.5–15.5)
WBC: 4.2 10*3/uL (ref 4.0–10.5)
nRBC: 0 % (ref 0.0–0.2)

## 2022-01-29 LAB — MAGNESIUM: Magnesium: 1.7 mg/dL (ref 1.7–2.4)

## 2022-01-29 MED ORDER — AMIODARONE HCL 200 MG PO TABS
200.0000 mg | ORAL_TABLET | Freq: Every day | ORAL | 0 refills | Status: DC
  Filled 2022-01-29: qty 30, 30d supply, fill #0

## 2022-01-29 MED ORDER — APIXABAN 5 MG PO TABS
5.0000 mg | ORAL_TABLET | Freq: Two times a day (BID) | ORAL | 0 refills | Status: DC
  Filled 2022-01-29: qty 60, 30d supply, fill #0

## 2022-01-29 MED ORDER — NICOTINE 14 MG/24HR TD PT24
14.0000 mg | MEDICATED_PATCH | Freq: Every day | TRANSDERMAL | 0 refills | Status: DC
  Filled 2022-01-29: qty 28, 28d supply, fill #0

## 2022-01-29 NOTE — Anesthesia Postprocedure Evaluation (Signed)
Anesthesia Post Note  Patient: Karen Casey  Procedure(s) Performed: CARDIOVERSION     Patient location during evaluation: Endoscopy Anesthesia Type: General Level of consciousness: awake and alert Pain management: pain level controlled Vital Signs Assessment: post-procedure vital signs reviewed and stable Respiratory status: spontaneous breathing, nonlabored ventilation, respiratory function stable and patient connected to nasal cannula oxygen Cardiovascular status: blood pressure returned to baseline and stable Postop Assessment: no apparent nausea or vomiting Anesthetic complications: no   No notable events documented.  Last Vitals:  Vitals:   01/29/22 0300 01/29/22 0824  BP: (!) 92/57   Pulse:    Resp: 14   Temp:    SpO2:  96%    Last Pain:  Vitals:   01/29/22 0100  TempSrc: Oral  PainSc: 0-No pain                 Karen Casey

## 2022-01-29 NOTE — TOC Transition Note (Addendum)
Transition of Care [facility]) - CM/SW Discharge Note   Patient Details  Name: Karen Casey MRN: [id number] Date of Birth: Nov 20, 1955  [facility]) CM/SW Contact:  Pollie Friar, RN Phone Number: 01/29/2022, 2:10 PM   Clinical Narrative:    Pt is discharging home with resumption of home health services through [facility]. Information on the AVS.  Pt requesting PTAR home. CM has arranged transportation and updated the bedside RN.  Pt states her SO will be at home when she arrives.   1620: [facility] home health has said they dont have SW and can not see the patient if this is needed. CM has arranged [place] with [facility]. Information changed on the AVS.   Final next level of care: Home w [place] Barriers to Discharge: No Barriers Identified   Patient Goals and CMS Choice Patient states their goals for this hospitalization and ongoing recovery are:: to return to home      Discharge Placement                       Discharge Plan and Services   Discharge Planning Services: CM Consult Post Acute Care Choice: Home Health          DME Arranged: N/A         HH Arranged: OT, PT, RN, Social Work          Social Determinants of Health (SDOH) Interventions     Readmission Risk Interventions    01/26/2022   11:11 AM 01/03/2022   11:30 AM 12/01/2021    3:19 PM  Readmission Risk Prevention Plan  Transportation Screening Complete Complete Complete  Medication Review [employer])  Complete Complete  PCP or Specialist appointment within 3-5 days of discharge Complete Complete Complete  HRI or [place] Complete Complete Complete  SW Recovery Care/Counseling Consult Complete Complete Complete  Palliative Care Screening Not Applicable Not Applicable Not [place] Not Applicable Not Applicable Patient Refused

## 2022-01-29 NOTE — Progress Notes (Signed)
Daily Progress Note Intern Pager: [phone]  Patient name: Karen Casey Medical record number: [id number] Date of birth: 1956-05-26 Age: 66 y.o. Gender: female  Primary Care Provider: [name], MD Consultants: ID, cardiology Code Status: FULL  Pt Overview and Major Events to Date:  - 8/14 admitted for vomiting, diarrhea. ID consulted for concerns of inadequately treated enterococcus bacteremia - 8/15-8/16 a-fib with RVR, transferred to progressive - 8/17 cardioversion, successful conversion to NSR  Assessment and Plan:  Karen Casey is a 66 y.o. female who presented with recurrent enterocolitis, stay complicated by a-fib with RVR now s/p cardioversion with successful conversion to NSR. Relevant PMHx includes recent history of enterococcus bacteremia treated with linezolid and lost to follow-up with ID, GERD, a-fib  Atrial fibrillation with RVR ([place]) On 8/15 had developed a-fib with, placed on metoprolol. Hx untreated paroxysmal afib, also worsened in the setting of dehydration. Per cardiology, no diltiazem necessary. 8/17 stopped metoprolol, started amiodarone and heparin. Now s/p cardioversion with successful conversion to NSR - start oral amiodarone - resumed apixaban - f/u cards recs  Enterocolitis Improving. D/c on 7/27 for enterocolitis + enterococcus bacteremia, presented this admission for n/v/d. This presentation likely due to recurrence of enterocolitis, inadequate abx treatment. C. difficile negative without known infectious source. Hx GERD. ID consulted. ID ordered stool culture and GI PCR panel, cancelled upon resolution of diarrhea. WBC down from 16.6 to 6.5, no emesis, diarrhea since yesterday morning - admitted to [facility] med-surg, attending Dr. Nori Riis - appreciate ID recs - Loperamide (Imodium) for diarrhea - Pantoprazole (Protonix) 40 mg daily - famotidine (Pepcid) PRN for heartburn - on ondansetron (Zofran) for n/v - monitor fluid  status  Hypokalemia K 3.0, Mg 1.5. In the setting of acute vomiting and diarrhea, kidney function not affected - Zofran PRN (watch Qtc) - Recheck BMP in AM  Bacteremia due to Enterococcus H/o recent bacteremia with enterococcus avium, blood cx obtained in ED. Sent out on linezolid x2 weeks but unsure if completed - per pharmacy notes, was denied coverage. Also lost to ID f/u. TTE last admission negative for endocarditis. - ID following, appreciate recs - f/u blood cx  Anxiety Patient's sister reports of concern regarding severe anxiety, s/p ativan x1 dose in ED with history of chronic opioid use but has since been completely weaned. Stopped buspar last admission due to risk of serotonin syndrome with Zyvox  - Continue nortriptyline home dose - started on hydroxyzine PRN TID  HTN (hypertension) Home amodipine held. Now hypotensive, started on midodrine TID and increased IV fluids from 100 to 150 mL/hr - continue midodrine 5 mg TID  COPD (chronic obstructive pulmonary disease) ([place]) Continue with home albuterol PRN and equivalent to Trelegy.   Paroxysmal atrial fibrillation (HCC) Not on chronic anticoagulation per outpatient providers, regular on exam. S/p cardizem x1 dose. Per cardiology, no diltiazem necessary, stop heparin - give metoprolol BID - continuous monitoring  Alcohol use Denies recent use without evidence of withdrawal. Pt states she achieved prolonged sobriety but cannot state for how long - CIWAs  - continue with folate home dose  - MVM   Unspecified protein-calorie malnutrition (HCC) Chronic, present on admission. Albumin 2.4. Consider nutrition consult to address chronic malnutrition once no longer NPO - nutrition consult   Odynophagia Patient endorses dysphagia with solid foods for past 2 days, likely in the setting of recurrent vomiting during presentation. Per speech therapy, likely has a a primary - requires outpatient workup for dysphagia with GI  Oral  candidiasis Noted on last admission but not mentioned during admission. Treated previously with Fluconazole, will recheck tomorrow for persistence and treat if indicated     FEN/GI: pantoprazole, famotidine PRN, loperamide PRN, ondansetron PRN PPx: apixaban Dispo:Home with home health today.  Subjective:  Patient still endorsing dysphagia with solids. Discussed with patient need for follow-up speech evaluation, and possible discharge tomorrow. Denies n/v/d.  Objective: Temp:  [97.4 F (36.3 C)-98 F (36.7 C)] 98 F (36.7 C) (08/18 0100) Pulse Rate:  [73-107] 73 (08/17 1516) Resp:  [14-20] 14 (08/18 0300) BP: (83-110)/(52-75) 92/57 (08/18 0300) SpO2:  [94 %-98 %] 96 % (08/18 0824) Physical Exam: General: well-appearing and in no acute distress HEENT: normocephalic and atraumatic Cardiovascular: regular rate Respiratory: CTAB anteriorly, normal respiratory effort, and on RA Gastrointestinal: non-tender and non-distended Extremities: moving all extremities spontaneously Neuro: following commands and no focal neurological deficits   Laboratory: Most recent CBC Lab Results  Component Value Date   WBC 4.2 01/29/2022   HGB 9.6 (L) 01/29/2022   HCT 29.3 (L) 01/29/2022   MCV 98.0 01/29/2022   PLT 148 (L) 01/29/2022   Most recent BMP    Latest Ref Rng & Units 01/29/2022    4:50 AM  BMP  Glucose 70 - 99 mg/dL 74   BUN 8 - 23 mg/dL <5   Creatinine 0.44 - 1.00 mg/dL 0.72   Sodium 135 - 145 mmol/L 134   Potassium 3.5 - 5.1 mmol/L 4.3   Chloride 98 - 111 mmol/L 109   CO2 22 - 32 mmol/L 21   Calcium 8.9 - 10.3 mg/dL 7.9    Camelia Phenes, MD 01/29/2022, 12:45 PM  PGY-1, [place] Intern pager: [phone], text pages welcome Secure chat group [place]

## 2022-01-29 NOTE — Progress Notes (Signed)
Occupational Therapy Treatment Patient Details Name: Karen Casey MRN: [id number] DOB: 12-29-1955 Today's Date: 01/29/2022   History of present illness 66 y.o. female presenting 8/14 with dehydration, nausea, and diarrhea. Has a fall history but cannot elaborate on it.  Pt denies being able to walk.  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   OT comments  Pt in bed upon therapy arrival and agreeable to work with OT. Pt reports that her dog died yesterday and she's not having the best day to do much. Pt was agreeable to participate in simulated ADL tasks while seated on EOB. Demonstrates safe and effective ability to complete bathing, dressing, and grooming tasks either at bed level or seated on EOB. Pt reports that her husband provides assist and is able to continue to do so. Pt verbalized that her insurance will not cover therapy at a SNF because it has changed. She informed OT that right before this hospitalization, she had [place] therapy come to her home and complete their initial assessment. She is interested in them coming back out to work with her because she would like to be able to walk again. Based on patient's progress since initial OT evaluation, discharge recommendation has been updated to [facility] OT versus SNF. OT will continue to follow patient acutely.   Recommendations for follow up therapy are one component of a multi-disciplinary discharge planning process, led by the attending physician.  Recommendations may be updated based on patient status, additional functional criteria and insurance authorization.    Follow Up Recommendations  Home health OT    Assistance Recommended at Discharge Intermittent Supervision/Assistance  Patient can return home with the following  A little help with walking and/or transfers;A  little help with bathing/dressing/bathroom;Assistance with cooking/housework;Help with stairs or ramp for entrance;Assist for transportation;Direct supervision/assist for financial management;Direct supervision/assist for medications management         Precautions / Restrictions Precautions Precautions: Fall;Other (comment) Precaution Comments: BPs soft, with SBP decr in upright sitting; BLE neuropathy Restrictions Weight Bearing Restrictions: No       Mobility Bed Mobility Overal bed mobility: Modified Independent Bed Mobility: Supine to Sit, Rolling, Sit to Supine Rolling: Modified independent (Device/Increase time)   Supine to sit: Modified independent (Device/Increase time) Sit to supine: Modified independent (Device/Increase time)     Patient Response: Restless, Flat affect, Cooperative  Transfers Overall transfer level:  (Not assessed this session)       Balance   Sitting-balance support: Feet supported Sitting balance-Leahy Scale: Good       ADL either performed or assessed with clinical judgement   ADL       Grooming: Oral care;Wash/dry face;Wash/dry hands;Set up;Sitting Grooming Details (indicate cue type and reason): Pt sat on EOB to complete grooming Upper Body Bathing: Set up;Sitting Upper Body Bathing Details (indicate cue type and reason): simulated task while sitting on EOB Lower Body Bathing: Bed level;Supervison/ safety Lower Body Bathing Details (indicate cue type and reason): simulated task while supine in bed Upper Body Dressing : Set up;Sitting Upper Body Dressing Details (indicate cue type and reason): simulated task while sitting on EOB Lower Body Dressing: Supervision/safety;Sitting/lateral leans;Bed level Lower Body Dressing Details (indicate cue type and reason): Simulated task while seated on EOB and when supine in bed Toilet Transfer: Minimal assistance;BSC/3in1;Rolling walker (2 wheels) Toilet Transfer Details (indicate cue type and  reason): Did not wish to transfer out of bed. Per PT treatment note  yesterday, pt was able to complete step pivot transfer with Min A. Toileting- Clothing Manipulation and Hygiene: Minimal assistance;Sit to/from stand Toileting - Clothing Manipulation Details (indicate cue type and reason): simulated task              Cognition Arousal/Alertness: Awake/alert Behavior During Therapy: Restless (Calm) Overall Cognitive Status: No family/caregiver present to determine baseline cognitive functioning                     Pertinent Vitals/ Pain       Pain Assessment Pain Assessment: No/denies pain Pain Score: 0-No pain         Frequency  Min 2X/week        Progress Toward Goals  OT Goals(current goals can now be found in the care plan section)  Progress towards OT goals: Progressing toward goals     Plan Discharge plan needs to be updated;Frequency remains appropriate       AM-PAC OT "6 Clicks" Daily Activity     Outcome Measure   Help from another person eating meals?: None Help from another person taking care of personal grooming?: None Help from another person toileting, which includes using toliet, bedpan, or urinal?: A Lot (uses periwick) Help from another person bathing (including washing, rinsing, drying)?: A Little Help from another person to put on and taking off regular upper body clothing?: None Help from another person to put on and taking off regular lower body clothing?: A Little 6 Click Score: 20    End of Session    OT Visit Diagnosis: Unsteadiness on feet (R26.81);Muscle weakness (generalized) (M62.81)   Activity Tolerance Patient tolerated treatment well   Patient Left in bed;with call bell/phone within reach           Time: 0950-1008 OT Time Calculation (min): 18 min  Charges: OT General Charges $OT Visit: 1 Visit OT Treatments $Self Care/Home Management : 8-22 mins  Ailene Ravel, OTR/L,CBIS  Supplemental OT - [facility] and  [facility]   Eulon Allnutt, Clarene Duke 01/29/2022, 12:06 PM

## 2022-01-29 NOTE — Progress Notes (Signed)
Nutrition Brief Note  RD received a consult for assessment of nutrition requirement/status, calorie count, and poor PO.  Current diet order is Dysphagia 3, patient with only one meal competition documented at 100%. Labs and medications reviewed.   Pt reports that her appetite has been ok. States that she has had difficulty swallowing due to pain in her throat. States she has stuck with softer foods due to pain. Pt also endorses difficulty chewing food due to poor dentition. SLP evaluated pt prior to RD visit. Pt states that she drinks Ensure on occasion at home, but not regularly.  Pt denies any recent weight loss.   RD offered to order pt nutritional supplements; pt declined at this time. Recommend continuing Ensure at home as able and desire.   Pt with discharge summary signed. If nutrition issues arise, please consult RD.   Hermina Barters RD, LDN Clinical Dietitian See Shea Evans for contact information.

## 2022-01-29 NOTE — Assessment & Plan Note (Addendum)
Patient endorses dysphagia with solid foods for past 2 days, likely in the setting of recurrent vomiting during presentation. Per speech therapy, likely has a a primary - requires outpatient workup for dysphagia with GI

## 2022-01-29 NOTE — Progress Notes (Signed)
Speech Language Pathology Treatment: Dysphagia  Patient Details Name: Karen Casey MRN: [id number] DOB: May 30, 1956 Today's Date: 01/29/2022 Time: 9244-6286 SLP Time Calculation (min) (ACUTE ONLY): 22 min  Assessment / Plan / Recommendation Clinical Impression  Pt's oropharyngeal function appears to be grossly functional, but she has subjective c/o globus sensation and odynophagia. At times it can be hard to decipher what she prefers - initially telling me, "I don't want someone messing with my diet" when talking about current mechanical soft solids, but then telling me she can't swallow when asked if she wanted to upgrade to regular solids. Given hx and previous testing, suspect that she has primary esophageal issues. She believes this as well, and is insistent that she wants w/u to know why. "I can't eat like this for the rest of my life!" Recommend considering f/u with GI to better evaluate symptoms. Pt agreeable to stay on mechanical soft diet for now with education provided about aspiration and esophageal precautions to use in the meantime.   HPI HPI: Patient is a 66 y.o. female with PMH: COPD, HTN, anxiety, alcohol use, tobacco use, GERD. She was recently admitted in June of 2023 after being found down at home and then readmitted in July 2023 following 3 day h/o diarrhea, nausea and vomiting with no relief rom PPI medications.      SLP Plan  Continue with current plan of care      Recommendations for follow up therapy are one component of a multi-disciplinary discharge planning process, led by the attending physician.  Recommendations may be updated based on patient status, additional functional criteria and insurance authorization.    Recommendations  Diet recommendations: Dysphagia 3 (mechanical soft);Thin liquid Liquids provided via: Cup;Straw Medication Administration: Whole meds with liquid Supervision: Patient able to self feed;Intermittent supervision to cue for compensatory  strategies Compensations: Minimize environmental distractions;Slow rate;Small sips/bites Postural Changes and/or Swallow Maneuvers: Seated upright 90 degrees;Upright 30-60 min after meal                Oral Care Recommendations: Oral care BID Follow Up Recommendations: No SLP follow up Assistance recommended at discharge: Intermittent Supervision/Assistance SLP Visit Diagnosis: Dysphagia, unspecified (R13.10) Plan: Continue with current plan of care           Osie Bond., M.A. [place] Office [phone]  Secure chat preferred   01/29/2022, 11:38 AM

## 2022-01-29 NOTE — Discharge Instructions (Addendum)
Dear Karen Casey,  It was a pleasure to take care of you while you were admitted for treatment of your enterocolitis (stomach bug)  While you were here, you were: Observed and cared for by nurses and other staff Provided physical and occupational therapy to help you move and learn skills to take care of yourself Tested and treated for your medical issues, including a cardioversion to help your heart function properly  Please continue to take your heart medications: amiodarone (also called Pacerone) and apixaban (also called Eliquis).  Please also follow-up with cardiology for your heart condition and follow-up with your primary care provider (PCP) to manage all your other medical conditions.  Take care!

## 2022-01-29 NOTE — Discharge Summary (Addendum)
[facility] Discharge Summary  Patient name: Karen Casey Medical record number: [id number] Date of birth: 09/22/1955 Age: 66 y.o. Gender: female Date of Admission: 01/25/2022  Date of Discharge: 01/29/2022  Admitting Physician: Erskine Emery, MD  Primary Care Provider: [name], MD Consultants: ID, cardiology, PT/OT, SLP  Indication for Hospitalization: enterocolitis, subsequent development of Afib with RVR  Brief Hospital Course:  Karen Casey is a 66 y.o. female who presented with recurrent enterocolitis, received supportive therapy with spontaneous resolution, stay complicated by a-fib with RVR now s/p cardioversion with successful conversion to NSR.  Enterocolitis Patient was recently discharged on 7/27 for enterocolitis + enterococcus bacteremia, was seen by ID and and was discharged on linezolid for 13 days. Per patient and pharmacy tech note, she could not afford the prescribed antibiotics. She said she took another antibiotic instead of the prescribed linezolid. This presentation is likely due to a recurrence of enterocolitis secondary to inadequate abx treatment in the setting of poor patient access. C. Difficile obtained - negative. Patient has history of GERD. ID consulted and did not recommend antibiotic treatment at this time. There was concern for SBO vs ileus on abdominal xray. General surgery was consulted and recommended CT abdomen/pelvis w contrast which came back negative for SBO. Patient improved symptomatically prior to discharge. ID will follow-up as an outpatient.  Atrial Fibrillation with RVR On 8/15 found to be in A Fib with RVR. Has history of paroxysmal afib, though not on chronic anticoagulation due to low burden and CHADS2VASc of 1. Started on metoprolol and then transitioned to amiodarone with inadequate rate control.  Cardioversion performed 8/17 with successful conversion to normal sinus rhythm. Continue apixaban and  amiodarone upon discharge per cardiology.  Bacteremia due to Enterococcus H/o recent bacteremia with enterococcus avium, blood cx obtained in ED. Sent out on linezolid x2 weeks but patient not able to take. Per pharmacy notes, was denied coverage. Also lost to ID f/u. TTE last admission negative for endocarditis. ID followed during hospitalization. Blood cultures were negative. ID follow-up as an outpatient as above.  Items for Follow-up Started on Eliquis and Amiodarone post cardioversion. Will need to continue for at least 1 month. Will need [name] cardiology follow up outpatient.  Recommended for dysphagia 3 diet based on patient reported odynophagia/globus sensation. Evaluated by SLP inpatient and found to have grossly functional oropharyngeal function. Consider outpt GI referral if continued difficulty with swallowing.  We held patient's amlodipine upon discharge. Please follow-up on her blood pressure and restart as appropriate. Recommend repeat CBC at follow-up. Patient's hemoglobin was noted to downtrend over hospitalization, but in the absence of evidence of bleeding, this was thought to be hemodilutional. Hemoglobin on day of discharge was 9.6.  Discharge Diagnoses/Problem List:  Atrial fibrillation with RVR Enterocolitis Hypokalemia Bacteremia due to Enterococcus Anxiety HTN (hypertension) COPD (chronic obstructive pulmonary disease) Paroxysmal atrial fibrillation Alcohol use Unspecified protein-calorie malnutrition Odynophagia Oral candidiasis  Disposition: home with home health  Discharge Condition: stable  Discharge Exam: BP (!) 92/57   Pulse 73   Temp 98 F (36.7 C) (Oral)   Resp 14   Ht '5\' 3"'$  (1.6 m)   Wt 130 lb 1.1 oz (59 kg)   SpO2 96%   BMI 23.04 kg/m  From Dr. Althea Charon same day progress note: General: well-appearing and in no acute distress HEENT: normocephalic and atraumatic Cardiovascular: regular rate Respiratory: CTAB anteriorly, normal respiratory  effort, and on RA Gastrointestinal: non-tender and non-distended Extremities: moving  all extremities spontaneously Neuro: following commands and no focal neurological deficits  Significant Labs and Imaging:  Recent Labs  Lab 01/28/22 0805 01/29/22 0450  WBC 6.5 4.2  HGB 10.8* 9.6*  HCT 32.8* 29.3*  PLT 152 148*   Recent Labs  Lab 01/28/22 0805 01/29/22 0450  NA 134* 134*  K 3.7 4.3  CL 109 109  CO2 19* 21*  GLUCOSE 66* 74  BUN 6* <5*  CREATININE 0.56 0.72  CALCIUM 7.6* 7.9*  MG 1.8 1.7    Results/Tests Pending at Time of Discharge: none  Discharge Medications:  Allergies as of 01/29/2022       Reactions   Capsaicin-menthol Rash, Other (See Comments)   Burning and peeling on area applied to   Diclo Gel [diclofenac Sodium] Rash, Other (See Comments)   Burning and peeling at the sight of application.        Medication List     STOP taking these medications    amLODipine 5 MG tablet Commonly known as: NORVASC   OXYGEN       TAKE these medications    acetaminophen 650 MG CR tablet Commonly known as: TYLENOL Take 1 tablet (650 mg total) by mouth every 6 (six) hours as needed for pain.   albuterol 108 (90 Base) MCG/ACT inhaler Commonly known as: VENTOLIN HFA Inhale 2 puffs into the lungs every 6 (six) hours as needed for wheezing or shortness of breath.   amiodarone 200 MG tablet Commonly known as: PACERONE Take 1 tablet (200 mg total) by mouth daily. Start taking on: January 30, 2022   apixaban 5 MG Tabs tablet Commonly known as: ELIQUIS Take 1 tablet (5 mg total) by mouth 2 (two) times daily.   busPIRone 10 MG tablet Commonly known as: BUSPAR Take 10 mg by mouth 3 (three) times daily as needed (anxiety).   CALCIUM PO Take 1 tablet by mouth daily.   CENTRUM SILVER 50+WOMEN PO Take 1 tablet by mouth daily.   Ensure Original Liqd Take 1 Package by mouth daily as needed (loss of appetite). Chocolate only   FOLIC ACID PO Take 1 tablet by  mouth daily.   gabapentin 600 MG tablet Commonly known as: NEURONTIN TAKE 1 TABLET BY MOUTH THREE TIMES A DAY What changed:  when to take this reasons to take this   nicotine 14 mg/24hr patch Commonly known as: NICODERM CQ - dosed in mg/24 hours Place 1 patch (14 mg total) onto the skin daily. Start taking on: January 30, 2022   nortriptyline 25 MG capsule Commonly known as: PAMELOR Take 3 capsules (75 mg total) by mouth at bedtime. This was patient's prior to admission home dose that she was taking.   pantoprazole 40 MG tablet Commonly known as: PROTONIX Take 1 tablet (40 mg total) by mouth every morning. What changed:  when to take this reasons to take this   QUEtiapine 100 MG tablet Commonly known as: SEROQUEL Take 100 mg by mouth at bedtime.   Trelegy Ellipta 100-62.5-25 MCG/ACT Aepb Generic drug: Fluticasone-Umeclidin-Vilant Inhale 2 puffs into the lungs daily.   VITAMIN D3 PO Take 1 capsule by mouth daily.        Discharge Instructions: Please refer to Patient Instructions section of EMR for full details.  Patient was counseled important signs and symptoms that should prompt return to medical care, changes in medications, dietary instructions, activity restrictions, and follow up appointments.   Follow-Up Appointments:   Camelia Phenes, MD 01/29/2022, 1:23 PM PGY-1, [place]  Family Medicine

## 2022-01-29 NOTE — Care Management Important Message (Signed)
Important Message  Patient Details  Name: Karen Casey MRN: [id number] Date of Birth: June 18, 1955   Medicare Important Message Given:  Yes     Orbie Pyo 01/29/2022, 2:57 PM

## 2022-01-30 ENCOUNTER — Encounter (HOSPITAL_COMMUNITY): Payer: Self-pay | Admitting: Cardiology

## 2022-01-30 LAB — CULTURE, BLOOD (ROUTINE X 2)
Culture: NO GROWTH
Culture: NO GROWTH

## 2022-01-31 ENCOUNTER — Other Ambulatory Visit: Payer: Self-pay | Admitting: Student

## 2022-01-31 DIAGNOSIS — F419 Anxiety disorder, unspecified: Secondary | ICD-10-CM

## 2022-01-31 MED ORDER — HYDROXYZINE HCL 10 MG PO TABS: 10.0000 mg | ORAL_TABLET | Freq: Three times a day (TID) | ORAL | 0 refills | Status: DC | PRN

## 2022-01-31 NOTE — Progress Notes (Signed)
Patient recently discharged from hospital service, called hospital unit to request something for anxiety. Reports anxiety so severe she has not slept  in two days. Patient had received hydroxyzine while in hospital to good effect. - Hydroxyzine to pharmacy PRN for anxiety, may also help with sleep - Patient to keep hospital follow-up appointment  Pearla Dubonnet, MD

## 2022-02-01 ENCOUNTER — Telehealth: Payer: Self-pay

## 2022-02-01 NOTE — Patient Outreach (Signed)
  Care Coordination TOC Note Transition Care Management Follow-up Telephone Call Date of discharge and from where: [facility] 01/25/22-01/29/22 How have you been since you were released from the hospital? "I am feeling bad since I can't walk due to my neuropathy." Any questions or concerns? Yes- Wants to see neurologist  Items Reviewed: Did the pt receive and understand the discharge instructions provided? Yes  Medications obtained and verified? Yes  Other? No  Any new allergies since your discharge? No  Dietary orders reviewed? No Do you have support at home? Yes   Home Care and Equipment/Supplies: Were home health services ordered? yes If so, what is the name of the agency? [facility]  Has the agency set up a time to come to the patient's home? Unsure, patient terminated call prior to discussing. Were any new equipment or medical supplies ordered?  No What is the name of the medical supply agency? N/A Were you able to get the supplies/equipment? not applicable Do you have any questions related to the use of the equipment or supplies? No  Functional Questionnaire: (I = Independent and D = Dependent) ADLs: Not available as patient did not want to talk and hung up the phone.  Bathing/Dressing-   Meal Prep-   Eating-   Maintaining continence-   Transferring/Ambulation-   Managing Meds-   Follow up appointments reviewed:  PCP Hospital f/u appt confirmed? Yes  Scheduled to see Dr. Andria Frames on 02/08/22 @ 0830. [facility] f/u appt confirmed? Yes  Scheduled to see Dr. Shellia Carwin on 02/05/22 @ 1:00. Are transportation arrangements needed? No  If their condition worsens, is the pt aware to call PCP or go to the Emergency Dept.? Yes Was the patient provided with contact information for the PCP's office or ED? Yes Was to pt encouraged to call back with questions or concerns? Yes  SDOH assessments and interventions completed:   Yes  Care Coordination Interventions Activated:  Yes    Care Coordination Interventions:   Patient did not complete call     Encounter Outcome:  Pt. Refused

## 2022-02-01 NOTE — Patient Outreach (Signed)
  Care Coordination TOC Note Transition Care Management Follow-up Telephone Call Date of discharge and from where: [facility] 01/[facility]/22-01/29/22 How have you been since you were released from the hospital? Patient called back to complete TOC call as we were disconnected earlier. Any questions or concerns? No  Items Reviewed: Did the pt receive and understand the discharge instructions provided? Yes  Medications obtained and verified? Yes  Other? No  Any new allergies since your discharge? No  Dietary orders reviewed? Yes Do you have support at home? Yes   Home Care and Equipment/Supplies: Were home health services ordered? yes If so, what is the name of the agency? [facility]  Has the agency set up a time to come to the patient's home? no Were any new equipment or medical supplies ordered?  No What is the name of the medical supply agency? N/A Were you able to get the supplies/equipment? no Do you have any questions related to the use of the equipment or supplies? No  Functional Questionnaire: (I = Independent and D = Dependent) ADLs: D  Bathing/Dressing- D  Meal Prep- D  Eating- D  Maintaining continence- D  Transferring/Ambulation- D  Managing Meds- D  Follow up appointments reviewed:  PCP Hospital f/u appt confirmed? Yes  Scheduled to see Dr. Andria Frames on 02/08/22 @ 08:30. [facility] f/u appt confirmed? Yes  Scheduled to see Dr. Shellia Carwin on 02/05/22 @ 1:00. Are transportation arrangements needed? No  If their condition worsens, is the pt aware to call PCP or go to the Emergency Dept.? Yes Was the patient provided with contact information for the PCP's office or ED? Yes Was to pt encouraged to call back with questions or concerns? Yes  SDOH assessments and interventions completed:   Yes  Care Coordination Interventions Activated:  Yes   Care Coordination Interventions:  Referred for Care Coordination Services:  RN Care Coordinator    Encounter Outcome:  Pt. Visit  Completed

## 2022-02-04 ENCOUNTER — Telehealth: Payer: Self-pay

## 2022-02-04 ENCOUNTER — Other Ambulatory Visit (HOSPITAL_COMMUNITY): Payer: Self-pay

## 2022-02-04 NOTE — Telephone Encounter (Signed)
[facility] RN with Karen Casey calls nurse line to get VO to move nursing evaluation.   Karen reports this is the 3x she has cancelled home visit.   Karen plans to try again next week.   Verbal given.

## 2022-02-05 ENCOUNTER — Ambulatory Visit: Payer: Medicaid Other | Admitting: Internal Medicine

## 2022-02-08 ENCOUNTER — Other Ambulatory Visit: Payer: Self-pay

## 2022-02-08 ENCOUNTER — Observation Stay (HOSPITAL_COMMUNITY)
Admission: EM | Admit: 2022-02-08 | Discharge: 2022-02-09 | Disposition: A | Discharge status: Home Health | Payer: Self-pay | Attending: Family Medicine | Admitting: Family Medicine

## 2022-02-08 ENCOUNTER — Emergency Department (HOSPITAL_COMMUNITY): Payer: Self-pay

## 2022-02-08 ENCOUNTER — Encounter (HOSPITAL_COMMUNITY): Payer: Self-pay

## 2022-02-08 ENCOUNTER — Inpatient Hospital Stay: Payer: Self-pay | Admitting: Family Medicine

## 2022-02-08 DIAGNOSIS — Z7901 Long term (current) use of anticoagulants: Secondary | ICD-10-CM | POA: Diagnosis not present

## 2022-02-08 DIAGNOSIS — F419 Anxiety disorder, unspecified: Secondary | ICD-10-CM | POA: Diagnosis present

## 2022-02-08 DIAGNOSIS — Z79899 Other long term (current) drug therapy: Secondary | ICD-10-CM | POA: Diagnosis not present

## 2022-02-08 DIAGNOSIS — I480 Paroxysmal atrial fibrillation: Secondary | ICD-10-CM | POA: Diagnosis present

## 2022-02-08 DIAGNOSIS — R1084 Generalized abdominal pain: Secondary | ICD-10-CM

## 2022-02-08 DIAGNOSIS — F17210 Nicotine dependence, cigarettes, uncomplicated: Secondary | ICD-10-CM | POA: Insufficient documentation

## 2022-02-08 DIAGNOSIS — E860 Dehydration: Secondary | ICD-10-CM

## 2022-02-08 DIAGNOSIS — J449 Chronic obstructive pulmonary disease, unspecified: Secondary | ICD-10-CM | POA: Diagnosis not present

## 2022-02-08 DIAGNOSIS — R1110 Vomiting, unspecified: Secondary | ICD-10-CM | POA: Diagnosis present

## 2022-02-08 DIAGNOSIS — R112 Nausea with vomiting, unspecified: Principal | ICD-10-CM

## 2022-02-08 DIAGNOSIS — G629 Polyneuropathy, unspecified: Secondary | ICD-10-CM | POA: Diagnosis not present

## 2022-02-08 DIAGNOSIS — Z20822 Contact with and (suspected) exposure to covid-19: Secondary | ICD-10-CM | POA: Diagnosis not present

## 2022-02-08 DIAGNOSIS — I10 Essential (primary) hypertension: Secondary | ICD-10-CM | POA: Insufficient documentation

## 2022-02-08 LAB — CBC WITH DIFFERENTIAL/PLATELET
Abs Immature Granulocytes: 0.04 10*3/uL (ref 0.00–0.07)
Basophils Absolute: 0.1 10*3/uL (ref 0.0–0.1)
Basophils Relative: 0 %
Eosinophils Absolute: 0 10*3/uL (ref 0.0–0.5)
Eosinophils Relative: 0 %
HCT: 42 % (ref 36.0–46.0)
Hemoglobin: 14.6 g/dL (ref 12.0–15.0)
Immature Granulocytes: 0 %
Lymphocytes Relative: 22 %
Lymphs Abs: 3.1 10*3/uL (ref 0.7–4.0)
MCH: 31.8 pg (ref 26.0–34.0)
MCHC: 34.8 g/dL (ref 30.0–36.0)
MCV: 91.5 fL (ref 80.0–100.0)
Monocytes Absolute: 0.7 10*3/uL (ref 0.1–1.0)
Monocytes Relative: 5 %
Neutro Abs: 10.2 10*3/uL — ABNORMAL HIGH (ref 1.7–7.7)
Neutrophils Relative %: 73 %
Platelets: 425 10*3/uL — ABNORMAL HIGH (ref 150–400)
RBC: 4.59 MIL/uL (ref 3.87–5.11)
RDW: 13.6 % (ref 11.5–15.5)
WBC: 14.1 10*3/uL — ABNORMAL HIGH (ref 4.0–10.5)
nRBC: 0 % (ref 0.0–0.2)

## 2022-02-08 LAB — RESP PANEL BY RT-PCR (FLU A&B, COVID) ARPGX2
Influenza A by PCR: NEGATIVE
Influenza B by PCR: NEGATIVE
SARS Coronavirus 2 by RT PCR: NEGATIVE

## 2022-02-08 LAB — COMPREHENSIVE METABOLIC PANEL
ALT: 20 U/L (ref 0–44)
AST: 34 U/L (ref 15–41)
Albumin: 2.5 g/dL — ABNORMAL LOW (ref 3.5–5.0)
Alkaline Phosphatase: 91 U/L (ref 38–126)
Anion gap: 13 (ref 5–15)
BUN: 10 mg/dL (ref 8–23)
CO2: 17 mmol/L — ABNORMAL LOW (ref 22–32)
Calcium: 8.6 mg/dL — ABNORMAL LOW (ref 8.9–10.3)
Chloride: 104 mmol/L (ref 98–111)
Creatinine, Ser: 0.84 mg/dL (ref 0.44–1.00)
GFR, Estimated: >60 mL/min (ref >60)
Glucose, Bld: 120 mg/dL — ABNORMAL HIGH (ref 70–99)
Potassium: 3.6 mmol/L (ref 3.5–5.1)
Sodium: 134 mmol/L — ABNORMAL LOW (ref 135–145)
Total Bilirubin: 0.5 mg/dL (ref 0.3–1.2)
Total Protein: 5.6 g/dL — ABNORMAL LOW (ref 6.5–8.1)

## 2022-02-08 LAB — LIPASE, BLOOD: Lipase: 23 U/L (ref 11–51)

## 2022-02-08 LAB — LACTIC ACID, PLASMA
Lactic Acid, Venous: 2.8 mmol/L (ref 0.5–1.9)
Lactic Acid, Venous: 1.4 mmol/L (ref 0.5–1.9)

## 2022-02-08 MED ORDER — VANCOMYCIN HCL IN DEXTROSE 1-5 GM/200ML-% IV SOLN: 1000.0000 mg | Freq: Once | INTRAVENOUS | Status: DC

## 2022-02-08 MED ORDER — VANCOMYCIN HCL 1250 MG/250ML IV SOLN
1250.0000 mg | Freq: Once | INTRAVENOUS | Status: AC
  Administered 2022-02-08: 1250 mg via INTRAVENOUS
  Filled 2022-02-08: qty 250

## 2022-02-08 MED ORDER — SODIUM CHLORIDE 0.9 % IV BOLUS
1000.0000 mL | Freq: Once | INTRAVENOUS | Status: AC
  Administered 2022-02-08: 1000 mL via INTRAVENOUS

## 2022-02-08 MED ORDER — SODIUM CHLORIDE 0.9 % IV SOLN: 2.0000 g | Freq: Two times a day (BID) | INTRAVENOUS | Status: DC

## 2022-02-08 MED ORDER — LACTATED RINGERS IV SOLN: INTRAVENOUS | Status: AC

## 2022-02-08 MED ORDER — ONDANSETRON HCL 4 MG/2ML IJ SOLN
4.0000 mg | Freq: Once | INTRAMUSCULAR | Status: AC
  Administered 2022-02-08: 4 mg via INTRAVENOUS
  Filled 2022-02-08: qty 2

## 2022-02-08 MED ORDER — LACTATED RINGERS IV BOLUS (SEPSIS)
1000.0000 mL | Freq: Once | INTRAVENOUS | Status: AC
  Administered 2022-02-08: 1000 mL via INTRAVENOUS

## 2022-02-08 MED ORDER — SODIUM CHLORIDE 0.9 % IV SOLN
2.0000 g | Freq: Once | INTRAVENOUS | Status: AC
  Administered 2022-02-08: 2 g via INTRAVENOUS
  Filled 2022-02-08: qty 12.5

## 2022-02-08 MED ORDER — IOHEXOL 300 MG/ML  SOLN
100.0000 mL | Freq: Once | INTRAMUSCULAR | Status: AC | PRN
  Administered 2022-02-08: 100 mL via INTRAVENOUS

## 2022-02-08 MED ORDER — VANCOMYCIN HCL IN DEXTROSE 1-5 GM/200ML-% IV SOLN: 1000.0000 mg | INTRAVENOUS | Status: DC

## 2022-02-08 MED ORDER — LIDOCAINE HCL URETHRAL/MUCOSAL 2 % EX GEL
1.0000 | Freq: Once | CUTANEOUS | Status: AC
  Administered 2022-02-08: 1 via TOPICAL
  Filled 2022-02-08: qty 11

## 2022-02-08 NOTE — ED Notes (Addendum)
In and out cath attempted for the second time, unsuccessful. Purewick replaced, pt encouraged to urinate

## 2022-02-08 NOTE — ED Notes (Signed)
Patient refusing staff to do in and out cath. EDP aware.

## 2022-02-08 NOTE — Progress Notes (Signed)
PIV PLACED W/ ULTRASOUND, flushes well with GOOD BLOOD RETURN, but patient stated " it burns." No swelling or redness on site. RN at bedside assessed the PIV , still noted with blood return and flushed well but pt. Still claimed it burns.

## 2022-02-08 NOTE — Progress Notes (Signed)
Notified bedside nurse of need to draw lactic acid(not drawn at 1530 as documented in the pt profile per lab).

## 2022-02-08 NOTE — ED Notes (Signed)
This nurse Called Lab regarding Lactic Results. Per lab, [facility] label was missing information. No ER staff was notified about the missing information on the Lab tube sent down.

## 2022-02-08 NOTE — Progress Notes (Addendum)
Pharmacy Antibiotic Note  Karen Casey is a 66 y.o. female admitted on 02/08/2022 with  intra-abdominal infection .  Pharmacy has been consulted for Cefepime and Vancomycin dosing.  Plan: Cefepime 2g IV q12h x 7 days Initiate loading dose of Vancomycin '1250mg'$  IV x 1, followed by  Vancomycin '1000mg'$  IV q24h (eAUC ~457) x 7 days    > Goal AUC 400-550    > Check vancomycin levels at steady state  Monitor daily CBC, temp, SCr, and for clinical signs of improvement  Watch renal function and adjust cefepime/vancomycin dosing as indicated F/u cultures and de-escalate antibiotics as able   Height: '5\' 3"'$  (160 cm) Weight: 59 kg (130 lb 1.1 oz) IBW/kg (Calculated) : 52.4  Temp (24hrs), Avg:98.3 F (36.8 C), Min:97.8 F (36.6 C), Max:98.8 F (37.1 C)  Recent Labs  Lab 02/08/22 1024  WBC 14.1*  CREATININE 0.84  LATICACIDVEN 2.8*    Estimated Creatinine Clearance: 56.7 mL/min (by C-G formula based on SCr of 0.84 mg/dL).    Allergies  Allergen Reactions   Capsaicin-Menthol Rash and Other (See Comments)    Burning and peeling on area applied to   Diclo Gel [Diclofenac Sodium] Rash and Other (See Comments)    Burning and peeling at the sight of application.    Antimicrobials this admission: Cefepime 8/28 >>  Vancomycin 8/28 >>   Dose adjustments this admission: N/A  Microbiology results: 8/28 BCx: pending  Thank you for allowing pharmacy to be a part of this patient's care.  Luisa Hart, PharmD, BCPS Clinical Pharmacist 02/08/2022 4:50 PM   Please refer to [facility] for pharmacy phone number

## 2022-02-08 NOTE — Progress Notes (Signed)
Notified bedside nurse of need to administer antibiotics.  

## 2022-02-08 NOTE — ED Provider Notes (Signed)
[facility] EMERGENCY DEPARTMENT Provider Note   CSN: [id number] Arrival date & time: 02/08/22  0354     History  Chief Complaint  Patient presents with   Emesis    Karen Casey is a 66 y.o. female.  Patient presents to the hospital via EMS complaining of nausea, vomiting, and diarrhea which began Friday.  Patient states that she has been unable to keep anything down.  She complains of diffuse diarrhea and frequent episodes of emesis.  She also endorses generalized abdominal pain described as sharp.  She denies shortness of breath or chest pain at this time.  The patient has had multiple visits with 2 admissions in the past 45 days for similar condition.  Patient with past medical history of enterocolitis, hypertension, seizures, GERD, COPD, history of atrial fibrillation on Eliquis, alcohol abuse, previous appendectomy  HPI     Home Medications Prior to Admission medications   Medication Sig Start Date End Date Taking? Authorizing Provider  acetaminophen (TYLENOL) 650 MG CR tablet Take 1 tablet (650 mg total) by mouth every 6 (six) hours as needed for pain. 01/07/22   Hongalgi, Lenis Dickinson, MD  albuterol (VENTOLIN HFA) 108 (90 Base) MCG/ACT inhaler Inhale 2 puffs into the lungs every 6 (six) hours as needed for wheezing or shortness of breath. 09/23/21   [name], MD  amiodarone (PACERONE) 200 MG tablet Take 1 tablet (200 mg total) by mouth daily. 01/30/22   Eppie Gibson, MD  apixaban (ELIQUIS) 5 MG TABS tablet Take 1 tablet (5 mg total) by mouth 2 (two) times daily. 01/29/22   Eppie Gibson, MD  busPIRone (BUSPAR) 10 MG tablet Take 10 mg by mouth 3 (three) times daily as needed (anxiety).    [provider]  CALCIUM PO Take 1 tablet by mouth daily.    [provider]  Cholecalciferol (VITAMIN D3 PO) Take 1 capsule by mouth daily.    [provider]  Fluticasone-Umeclidin-Vilant (TRELEGY ELLIPTA) 100-62.5-25 MCG/ACT AEPB Inhale 2  puffs into the lungs daily.    [provider]  FOLIC ACID PO Take 1 tablet by mouth daily.    [provider]  gabapentin (NEURONTIN) 600 MG tablet TAKE 1 TABLET BY MOUTH THREE TIMES A DAY Patient taking differently: Take 600 mg by mouth 3 (three) times daily as needed (neuropathy). 12/02/21   [name], MD  hydrOXYzine (ATARAX) 10 MG tablet Take 1 tablet (10 mg total) by mouth 3 (three) times daily as needed for anxiety. 01/31/22   Eppie Gibson, MD  Multiple Vitamins-Minerals (CENTRUM SILVER 50+WOMEN PO) Take 1 tablet by mouth daily.    [provider]  nicotine (NICODERM CQ - DOSED IN MG/24 HOURS) 14 mg/24hr patch Place 1 patch (14 mg total) onto the skin daily. 01/30/22   Eppie Gibson, MD  nortriptyline (PAMELOR) 25 MG capsule Take 3 capsules (75 mg total) by mouth at bedtime. This was patient's prior to admission home dose that she was taking. 01/07/22   Hongalgi, Lenis Dickinson, MD  Nutritional Supplements (ENSURE ORIGINAL) LIQD Take 1 Package by mouth daily as needed (loss of appetite). Chocolate only    [provider]  pantoprazole (PROTONIX) 40 MG tablet Take 1 tablet (40 mg total) by mouth every morning. Patient taking differently: Take 40 mg by mouth daily as needed (acid reflux). 10/22/21   [name], MD  QUEtiapine (SEROQUEL) 100 MG tablet Take 100 mg by mouth at bedtime. Patient not  taking: Reported on 01/26/2022    [provider]      Allergies    Capsaicin-menthol and Diclo gel [diclofenac sodium]    Review of Systems   Review of Systems  Constitutional:  Negative for fever.  Respiratory:  Negative for shortness of breath.   Cardiovascular:  Negative for chest pain.  Gastrointestinal:  Positive for abdominal pain, diarrhea, nausea and vomiting.  Genitourinary:  Negative for dysuria.  Skin:  Negative for pallor.  Neurological:  Negative for seizures and syncope.    Physical Exam Updated Vital Signs BP (!) 130/112    Pulse 98   Temp 97.9 F (36.6 C) (Oral)   Resp (!) 22   Ht '5\' 3"'$  (1.6 m)   Wt 59 kg   SpO2 98%   BMI 23.04 kg/m  Physical Exam Vitals and nursing note reviewed.  Constitutional:      Appearance: She is normal weight. She is ill-appearing.  HENT:     Head: Normocephalic and atraumatic.     Mouth/Throat:     Mouth: Mucous membranes are moist.  Eyes:     Conjunctiva/sclera: Conjunctivae normal.  Cardiovascular:     Rate and Rhythm: Regular rhythm. Tachycardia present.     Pulses: Normal pulses.     Heart sounds: Normal heart sounds.  Pulmonary:     Effort: Pulmonary effort is normal.     Breath sounds: Normal breath sounds.  Abdominal:     Palpations: Abdomen is soft.     Tenderness: There is abdominal tenderness (Mild generalized tenderness, worse in right lower quadrant.).  Musculoskeletal:        General: Normal range of motion.     Cervical back: Normal range of motion and neck supple.  Skin:    General: Skin is warm and dry.     Capillary Refill: Capillary refill takes less than 2 seconds.  Neurological:     Mental Status: She is alert and oriented to person, place, and time.     ED Results / Procedures / Treatments   Labs (all labs ordered are listed, but only abnormal results are displayed) Labs Reviewed  CBC WITH DIFFERENTIAL/PLATELET - Abnormal; Notable for the following components:      Result Value   WBC 14.1 (*)    Platelets 425 (*)    Neutro Abs 10.2 (*)    All other components within normal limits  COMPREHENSIVE METABOLIC PANEL - Abnormal; Notable for the following components:   Sodium 134 (*)    CO2 17 (*)    Glucose, Bld 120 (*)    Calcium 8.6 (*)    Total Protein 5.6 (*)    Albumin 2.5 (*)    All other components within normal limits  LACTIC ACID, PLASMA - Abnormal; Notable for the following components:   Lactic Acid, Venous 2.8 (*)    All other components within normal limits  RESP PANEL BY RT-PCR (FLU A&B, COVID) ARPGX2  CULTURE, BLOOD  (ROUTINE X 2)  CULTURE, BLOOD (ROUTINE X 2)  LACTIC ACID, PLASMA  LIPASE, BLOOD  URINALYSIS, ROUTINE W REFLEX MICROSCOPIC  CBC  BASIC METABOLIC PANEL  PROTIME-INR  APTT    EKG None  Radiology CT ABDOMEN PELVIS W CONTRAST  Result Date: 02/08/2022 CLINICAL DATA:  Abdominal pain, nausea/vomiting EXAM: CT ABDOMEN AND PELVIS WITH CONTRAST TECHNIQUE: Multidetector CT imaging of the abdomen and pelvis was performed using the standard protocol following bolus administration of intravenous contrast. RADIATION DOSE REDUCTION: This exam was performed according to the  departmental dose-optimization program which includes automated exposure control, adjustment of the mA and/or kV according to patient size and/or use of iterative reconstruction technique. CONTRAST:  141m OMNIPAQUE IOHEXOL 300 MG/ML  SOLN COMPARISON:  01/26/2022 FINDINGS: Lower chest: Lung bases are clear. Hepatobiliary: Liver is notable for suspected geographic hepatic steatosis. Gallbladder is unremarkable. No intrahepatic or extrahepatic duct dilatation. Pancreas: Within normal limits. Spleen: Within normal limits. Adrenals/Urinary Tract: Adrenal glands are within normal limits. Kidneys are within normal limits.  No hydronephrosis. Bladder is within normal limits. Stomach/Bowel: Stomach is within normal limits. No evidence of bowel obstruction. Prior appendectomy. No colonic wall thickening or inflammatory changes. Vascular/Lymphatic: No evidence of abdominal aortic aneurysm. Atherosclerotic calcifications of the abdominal aorta and branch vessels. No suspicious abdominopelvic lymphadenopathy. Reproductive: Uterus is within normal limits. Bilateral ovaries are within normal limits. Other: No abdominopelvic ascites. Musculoskeletal: Mild degenerative changes of the visualized thoracolumbar spine. IMPRESSION: No evidence of bowel obstruction. Prior appendectomy. No CT findings to account for the patient's abdominal pain. Electronically Signed    By: SJulian HyM.D.   On: 02/08/2022 17:59   DG Chest Port 1 View  Result Date: 02/08/2022 CLINICAL DATA:  Questionable sepsis. EXAM: PORTABLE CHEST 1 VIEW COMPARISON:  Chest x-ray 01/02/2022 FINDINGS: The heart size and mediastinal contours are within normal limits. Both lungs are clear. The visualized skeletal structures are unremarkable. IMPRESSION: No active disease. Electronically Signed   By: ARonney AstersM.D.   On: 02/08/2022 16:56    Procedures Procedures    Medications Ordered in ED Medications  lactated ringers infusion ( Intravenous New Bag/Given 02/08/22 2034)  ceFEPIme (MAXIPIME) 2 g in sodium chloride 0.9 % 100 mL IVPB (has no administration in time range)  vancomycin (VANCOCIN) IVPB 1000 mg/200 mL premix (has no administration in time range)  lidocaine (XYLOCAINE) 2 % jelly 1 Application (has no administration in time range)  sodium chloride 0.9 % bolus 1,000 mL (0 mLs Intravenous Stopped 02/08/22 1723)  ondansetron (ZOFRAN) injection 4 mg (4 mg Intravenous Given 02/08/22 1544)  lactated ringers bolus 1,000 mL (0 mLs Intravenous Stopped 02/08/22 2235)    And  lactated ringers bolus 1,000 mL (0 mLs Intravenous Stopped 02/08/22 2113)  ceFEPIme (MAXIPIME) 2 g in sodium chloride 0.9 % 100 mL IVPB (0 g Intravenous Stopped 02/08/22 1808)  vancomycin (VANCOREADY) IVPB 1250 mg/250 mL (0 mg Intravenous Stopped 02/08/22 2114)  iohexol (OMNIPAQUE) 300 MG/ML solution 100 mL (100 mLs Intravenous Contrast Given 02/08/22 1748)    ED Course/ Medical Decision Making/ A&P Clinical Course as of 02/08/22 2325  Mon Feb 08, 2022  1653 IV team called by nursing [LM]    Clinical Course User Index [LM] MRonny Bacon                          Medical Decision Making Amount and/or Complexity of Data Reviewed Labs: ordered. Radiology: ordered.  Risk Prescription drug management. Decision regarding hospitalization.   This patient presents to the ED for concern of nausea,  vomiting, diarrhea, this involves an extensive number of treatment options, and is a complaint that carries with it a high risk of complications and morbidity.  The differential diagnosis includes colitis, cholecystitis, gastroenteritis, and others   Co morbidities that complicate the patient evaluation  History of recent antibiotic use, enterocolitis, GERD   Additional history obtained:  Additional history obtained from EMS External records from outside source obtained and reviewed including discharge summaries and infectious  disease notes from the previous 2 hospitalizations showing treatment for enterocolitis   Lab Tests:  I Ordered, and personally interpreted labs.  The pertinent results include: Initial lactic 2.8, repeat 1.4, negative respiratory panel, sodium 134, WBC 14.1, neutrophil 10.2, lipase 23, blood cultures pending, urinalysis pending   Imaging Studies ordered:  I ordered imaging studies including CT abdomen pelvis, chest x-ray I independently visualized and interpreted imaging which showed no active disease on chest x-ray.  No CT findings to account for patient's abdominal pain. I agree with the radiologist interpretation   Cardiac Monitoring: / EKG:  The patient was maintained on a cardiac monitor.  I personally viewed and interpreted the cardiac monitored which showed an underlying rhythm of: Sinus tachycardia   Consultations Obtained:  I requested consultation with the family medicine Residency on-call provider,  and discussed lab and imaging findings as well as pertinent plan - they plan to see the patient for admission   Problem List / ED Course / Critical interventions / Medication management   I ordered medication including Zofran for nausea, vancomycin and cefepime for possible intra-abdominal infection and sepsis criteria Reevaluation of the patient after these medicines showed that the patient improved I have reviewed the patients home medicines and  have made adjustments as needed   Test / Admission - Considered:  This patient meets SIRS criteria with an elevated white count and tachycardia.  Concern for possible intra-abdominal infection based on nausea, vomiting, and diarrhea.  She has recently been admitted by the family medicine residency team for dehydration.  She will need admission for further IV antibiotics, fluids, and evaluation.          Final Clinical Impression(s) / ED Diagnoses Final diagnoses:  Nausea vomiting and diarrhea  Generalized abdominal pain  Dehydration    Rx / DC Orders ED Discharge Orders     None         Ronny Bacon 02/08/22 2325    Tretha Sciara, MD 02/09/22 [phone]

## 2022-02-08 NOTE — ED Triage Notes (Signed)
Patient arrived by [facility] from home with complaint n-v-d since Friday. Patient also complains of abdominal cramping with same. Complains of associated weakness and decreased intake for same. Alert and oriented

## 2022-02-08 NOTE — ED Notes (Signed)
Unable to obtain blood cultures at this time. IV team consult placed. EDP aware.

## 2022-02-08 NOTE — Progress Notes (Addendum)
Pt stated IV "messed up in CT". Currently has a PIV with compatible IVF and Vancomycin . Maxipime is to run separately. Pt refuses another stick for a PIV at this time.

## 2022-02-08 NOTE — Progress Notes (Signed)
Elink following code sepsis °

## 2022-02-08 NOTE — ED Notes (Signed)
Lab notified this RN about PTT/INR tube not having enough blood. This RN will attempt to redraw the lab.

## 2022-02-08 NOTE — ED Notes (Signed)
Pt bladder scanned, 151m seen, this RN and Lauren NT attempted in and out cath, unsuccessful. Pt refused second attempt [place] PA made aware

## 2022-02-08 NOTE — ED Provider Triage Note (Signed)
Emergency Medicine Provider Triage Evaluation Note  Karen Casey , a 66 y.o. female  was evaluated in triage.  Pt complains of nausea, vomiting, diarrhea, abdominal pain since Friday.  Recently admitted for enterocolitis, A-fib with RVR.  Endorses poor p.o. intake, and palpitations.  Denies chest pain, shortness of breath.  Review of Systems  Positive: As above Negative: As above  Physical Exam  BP 92/78 (BP Location: Right Arm)   Pulse (!) 118   Temp 98.8 F (37.1 C)   Resp 15   SpO2 100%  Gen:   Awake, no distress   Resp:  Normal effort  MSK:   Moves extremities without difficulty  Other:    Medical Decision Making  Medically screening exam initiated at 10:15 AM.  Appropriate orders placed.  Karen Casey was informed that the remainder of the evaluation will be completed by another provider, this initial triage assessment does not replace that evaluation, and the importance of remaining in the ED until their evaluation is complete.     Karen Courier, PA-C 02/08/22 1016

## 2022-02-08 NOTE — ED Notes (Signed)
First IV placed by IV team infiltrated in CT. Pt agreed to have IV team try again. IV team consulted

## 2022-02-08 NOTE — H&P (Shared)
Hospital Admission History and Physical Service Pager: [phone]  Patient name: AILEA RHATIGAN Medical record number: [id number] Date of Birth: 1956/01/12 Age: 66 y.o. Gender: female  Primary Care Provider: [name], MD Consultants: none Code Status: Full code Preferred Emergency Contact:  Contact Information     Name Relation Home Work Mobile   [place] Significant other [phone]     Lowanda Foster [place] Daughter [phone]  [phone]      Chief Complaint: Vomiting and Diarrhea   Assessment and Plan: LEONARDA LEIS is a 66 y.o. female presenting with nausea, vomiting, diffuse diarrhea ongoing x3 days. Differential for this patient's presentation of this includes gastritis/GERD, viral gastroenteritis, enterocolitis, IBS.  Most likely differential is gastroenteritis but with elevated WBC could be an infectious etiology like C. difficile with recent antibiotic use.  This could also be a functional bowel disorder with multiple admissions for vomiting and diarrhea.  * Vomiting and diarrhea Nonbloody x3 days. Possibly viral gastroenteritis vs functional GI disorder. Although meeting SIRS criteria initially in ED, do not suspect sepsis due to stabilization of vital signs with LR, generally well-appearing patient, no clear source of infection.  Reassuring CT scan and chest x-ray showing no source of infection. Also reassuring is the lack of electrolyte derangements or AKI. Her anxiety may be contributing to this presentation, her dog died suddenly August 17, 2022 which is when her symptoms began.  - Admit to FMTS, attending Dr. Dorcas Mcmurray - Continue 40 mg Protonix daily - Blood cultures pending - Discontinue antibiotics - If patient worsens consider getting C. difficile testing - Consider Imodium prn after C Diff ruled out - Electrolytes are stable and at baseline, recheck BMP in the morning and replete electrolytes as  necessary  Anxiety Patient is at baseline anxious.  Her dog died suddenly on 08-18-22 which has caused her distress.  - Continue hydroxyzine 10 mg 3 times daily as needed - Continue 10 mg BuSpar - Continue home nortriptyline 63m daily  Paroxysmal atrial fibrillation ([place] s/p cardioversion 01/28/2022. EKG showing sinus tachycardia in the ED, improved to NSR with LR bolus. - Continue Eliquis 5 mg twice daily - Continue 10 mg amiodarone daily - Continue telemetry monitoring overnight  Chronic and stable conditions: HTN: continue home amlodipine COPD: continue home inhalers H/o alcohol use disorder: no alcohol in 8 months. Continue home folate Neuropathy: continue home gabapentin, nortriptyline   FEN/GI: regular diet as tolerated  VTE Prophylaxis: On Eliquis for A-fib  Disposition: Med-tele  History of Present Illness:  PCAERA ENWRIGHTis a 66y.o. female presenting with nausea, vomiting, and diarrhea beginning F05-Mar-2024  She notes non-bloody vomiting and non-bloody diarrhea for the past 3 days. Took imodium today which seemed to help with her diarrhea. Only 1 episode today. 1 episode of vomiting early this morning. Endorses poor PO intake since the onset of her symptoms. No fevers. Also some productive cough since F03/05/24 No rhinorrhea, no sick contacts. Reports her CMauritaniapassed away on F03-10-2022  Chronic neuropathy not improved with gabapentin.   In the ED, she reported diffuse vomiting and diarrhea.  Her vital signs met SIRS criteria, she had a WBC of 14.1 and a lactic acid of 2.8.  Code sepsis was called and broad-spectrum antibiotics were started.  She was given 2 L LR and her sinus tachycardia improved.  CT abdomen showed no evidence of bowel obstruction or findings suggestive of her abdominal pain.  CXR showed no active disease.  Blood cultures were drawn, urine culture was ordered and pending collection.  EKG showed sinus tachycardia with no elevations.  Review Of Systems: Per  HPI with the following additions: Chronic chest pain, patient endorses association with her anxiety.  Chronic shortness of breath.  Unchanged from baseline. Right upper quadrant pain consistent with chronic rib pain.  Pain at baseline.  Pertinent Past Medical History: GERD, continue 40 mg Protonix Paroxysmal A-fib Ethanol abuse Tobacco abuse COPD Anxiety/depression Remainder reviewed in history tab.   Pertinent Past Surgical History: Past Surgical History:  Procedure Laterality Date   APPENDECTOMY     BIOPSY  01/04/2021   Procedure: BIOPSY;  Surgeon: Yetta Flock, MD;  Location: [place];  Service: Gastroenterology;;   BIOPSY  03/12/2021   Procedure: BIOPSY;  Surgeon: Irving Copas., MD;  Location: [place];  Service: Gastroenterology;;   CARDIOVERSION N/A 01/28/2022   Procedure: CARDIOVERSION;  Surgeon: Adrian Prows, MD;  Location: [place];  Service: Cardiovascular;  Laterality: N/A;   CATARACT EXTRACTION  06/09/2012   Left eye   COLONOSCOPY WITH PROPOFOL N/A 03/12/2021   Procedure: COLONOSCOPY WITH PROPOFOL;  Surgeon: Irving Copas., MD;  Location: [place];  Service: Gastroenterology;  Laterality: N/A;   ESOPHAGOGASTRODUODENOSCOPY (EGD) WITH PROPOFOL N/A 01/04/2021   Procedure: ESOPHAGOGASTRODUODENOSCOPY (EGD) WITH PROPOFOL;  Surgeon: Yetta Flock, MD;  Location: [place];  Service: Gastroenterology;  Laterality: N/A;   left shoulder dislocation  Sept 2011   POLYPECTOMY  03/12/2021   Procedure: POLYPECTOMY;  Surgeon: Mansouraty, Telford Nab., MD;  Location: [place];  Service: Gastroenterology;;    Angus Seller reviewed in history tab.  Pertinent Social History: Tobacco use: 3 cigarettes per week, previously 2PPD Alcohol use: none in 8 months  Other Substance use: none reported  Lives with spouse  Pertinent Family History: N/A Remainder reviewed in history tab.   Important Outpatient Medications: Eliquis 5 mg twice  daily Amiodarone 200 mg daily 40 mg Protonix 100 mg Seroquel 25 mg nortriptyline, 10 mg BuSpar, 10 mg Atarax Trelegy Ellipta, albuterol inhaler as needed 60 mg gabapentin 3 times daily for neuropathy  Remainder reviewed in medication history.   Objective: BP (!) 148/95   Pulse 73   Temp 98.4 F (36.9 C) (Oral)   Resp 17   Ht _0  (1.6 m)   Wt 59 kg   SpO2 98%   BMI 23.04 kg/m  Exam: General: Well-appearing, no acute distress Cardiovascular: Regular rate, regular rhythm, no murmurs appreciated on exam Respiratory: Mild wheezing, clear with no consolidations or crackles.  No increased work of breathing Gastrointestinal: Nondistended, mildly tender throughout MSK: No peripheral edema Neuro: No facial droop, ocular motions intact and equal, no slurring of speech Psych: Pleasant, distressed about death of her dog  Labs:  CBC BMET  Recent Labs  Lab 02/08/22 1024  WBC 14.1*  HGB 14.6  HCT 42.0  PLT 425*   Recent Labs  Lab 02/08/22 1024  NA 134*  K 3.6  CL 104  CO2 17*  BUN 10  CREATININE 0.84  GLUCOSE 120*  CALCIUM 8.6*    Lipase WNL Lactic acid 1.4 Hyponatremia sodium 134, consistent with baseline  Calcium 8.6, improved from baseline Negative respiratory panel for COVID and flu  EKG: Tachycardic, regular rhythm, normal axis with no deviations, no ST elevations.   Imaging Studies Performed:  CXR: Good lung expansion, no cardiomegaly, define costophrenic angles, no pulmonary infiltrates noted. Radiologist read: No active disease   CT abdomen and pelvis with contrast: -  No evidence of bowel obstruction - Prior appendectomy - No CT findings to account for patient's abdominal pain - No colonic wall thickening or inflammatory changes   Darci Current, DO 02/09/2022, 5:53 AM PGY-1, [place] Intern pager: [phone], text pages welcome Secure chat group [place] Upper-Level Resident  Addendum   I have independently interviewed and examined the patient. I have discussed the above with Dr. Sabra Heck and agree with the documented plan. My edits for correction/addition/clarification are included above. Please see any attending notes.   Alcus Dad, MD PGY-3, [place] [facility] 02/09/2022 5:53 AM  FPTS Service pager: [phone] (text pages welcome through [facility])

## 2022-02-09 ENCOUNTER — Telehealth: Payer: Self-pay | Admitting: Family Medicine

## 2022-02-09 ENCOUNTER — Other Ambulatory Visit (HOSPITAL_COMMUNITY): Payer: Self-pay

## 2022-02-09 DIAGNOSIS — R112 Nausea with vomiting, unspecified: Secondary | ICD-10-CM

## 2022-02-09 DIAGNOSIS — R1084 Generalized abdominal pain: Secondary | ICD-10-CM | POA: Insufficient documentation

## 2022-02-09 DIAGNOSIS — R1110 Vomiting, unspecified: Secondary | ICD-10-CM | POA: Diagnosis present

## 2022-02-09 DIAGNOSIS — F419 Anxiety disorder, unspecified: Secondary | ICD-10-CM

## 2022-02-09 DIAGNOSIS — R197 Diarrhea, unspecified: Secondary | ICD-10-CM

## 2022-02-09 DIAGNOSIS — I480 Paroxysmal atrial fibrillation: Secondary | ICD-10-CM

## 2022-02-09 HISTORY — DX: Nausea with vomiting, unspecified: R11.2

## 2022-02-09 LAB — BLOOD CULTURE ID PANEL (REFLEXED) - BCID2

## 2022-02-09 LAB — C DIFFICILE QUICK SCREEN W PCR REFLEX
C Diff antigen: NEGATIVE
C Diff interpretation: NOT DETECTED
C Diff toxin: NEGATIVE

## 2022-02-09 LAB — URINALYSIS, ROUTINE W REFLEX MICROSCOPIC
Bilirubin Urine: NEGATIVE
Glucose, UA: NEGATIVE mg/dL
Ketones, ur: 5 mg/dL — AB
Nitrite: NEGATIVE
Protein, ur: NEGATIVE mg/dL
Specific Gravity, Urine: >1.046 — ABNORMAL HIGH (ref 1.005–1.030)
pH: 5 (ref 5.0–8.0)

## 2022-02-09 MED ORDER — NORTRIPTYLINE HCL 25 MG PO CAPS
75.0000 mg | ORAL_CAPSULE | Freq: Every day | ORAL | Status: DC
  Filled 2022-02-09 (×2): qty 3

## 2022-02-09 MED ORDER — PANTOPRAZOLE SODIUM 40 MG PO TBEC
40.0000 mg | DELAYED_RELEASE_TABLET | Freq: Every morning | ORAL | Status: DC
  Administered 2022-02-09: 40 mg via ORAL
  Filled 2022-02-09: qty 1

## 2022-02-09 MED ORDER — QUETIAPINE FUMARATE 100 MG PO TABS
100.0000 mg | ORAL_TABLET | Freq: Every day | ORAL | 1 refills | Status: DC
  Filled 2022-02-09: qty 30, 30d supply, fill #0

## 2022-02-09 MED ORDER — LOPERAMIDE HCL 2 MG PO CAPS
2.0000 mg | ORAL_CAPSULE | ORAL | 0 refills | Status: DC | PRN
  Filled 2022-02-09: qty 30, 30d supply, fill #0

## 2022-02-09 MED ORDER — NICOTINE 14 MG/24HR TD PT24
14.0000 mg | MEDICATED_PATCH | Freq: Every day | TRANSDERMAL | Status: DC
Start: 2022-02-09 — End: 2022-02-09
  Administered 2022-02-09: 14 mg via TRANSDERMAL
  Filled 2022-02-09: qty 1

## 2022-02-09 MED ORDER — FLUTICASONE FUROATE-VILANTEROL 100-25 MCG/ACT IN AEPB
1.0000 | INHALATION_SPRAY | Freq: Every day | RESPIRATORY_TRACT | Status: DC
  Administered 2022-02-09: 1 via RESPIRATORY_TRACT
  Filled 2022-02-09: qty 28

## 2022-02-09 MED ORDER — APIXABAN 5 MG PO TABS
5.0000 mg | ORAL_TABLET | Freq: Two times a day (BID) | ORAL | Status: DC
  Administered 2022-02-09 (×2): 5 mg via ORAL
  Filled 2022-02-09 (×2): qty 1

## 2022-02-09 MED ORDER — BUSPIRONE HCL 10 MG PO TABS: 10.0000 mg | ORAL_TABLET | Freq: Three times a day (TID) | ORAL | Status: DC | PRN

## 2022-02-09 MED ORDER — LOPERAMIDE HCL 2 MG PO CAPS
2.0000 mg | ORAL_CAPSULE | ORAL | Status: DC | PRN
Start: 2022-02-09 — End: 2022-02-09
  Administered 2022-02-09: 2 mg via ORAL
  Filled 2022-02-09: qty 1

## 2022-02-09 MED ORDER — UMECLIDINIUM BROMIDE 62.5 MCG/ACT IN AEPB
1.0000 | INHALATION_SPRAY | Freq: Every day | RESPIRATORY_TRACT | Status: DC
  Administered 2022-02-09: 1 via RESPIRATORY_TRACT
  Filled 2022-02-09: qty 7

## 2022-02-09 MED ORDER — GABAPENTIN 600 MG PO TABS: 600.0000 mg | ORAL_TABLET | Freq: Three times a day (TID) | ORAL | Status: DC | PRN

## 2022-02-09 MED ORDER — ONDANSETRON HCL 4 MG PO TABS
4.0000 mg | ORAL_TABLET | Freq: Three times a day (TID) | ORAL | 1 refills | Status: DC | PRN
  Filled 2022-02-09: qty 30, 10d supply, fill #0

## 2022-02-09 MED ORDER — HYDROXYZINE HCL 10 MG PO TABS
10.0000 mg | ORAL_TABLET | Freq: Three times a day (TID) | ORAL | Status: DC | PRN
Start: 2022-02-08 — End: 2022-02-09

## 2022-02-09 MED ORDER — ACETAMINOPHEN 325 MG PO TABS: 650.0000 mg | ORAL_TABLET | Freq: Four times a day (QID) | ORAL | Status: DC | PRN

## 2022-02-09 MED ORDER — GABAPENTIN 600 MG PO TABS
600.0000 mg | ORAL_TABLET | Freq: Two times a day (BID) | ORAL | 1 refills | Status: DC
  Filled 2022-02-09: qty 60, 30d supply, fill #0

## 2022-02-09 MED ORDER — AMIODARONE HCL 200 MG PO TABS
200.0000 mg | ORAL_TABLET | Freq: Every day | ORAL | Status: DC
  Administered 2022-02-09: 200 mg via ORAL
  Filled 2022-02-09: qty 1

## 2022-02-09 MED ORDER — FOLIC ACID 1 MG PO TABS
1.0000 mg | ORAL_TABLET | Freq: Every day | ORAL | Status: DC
  Administered 2022-02-09: 1 mg via ORAL
  Filled 2022-02-09: qty 1

## 2022-02-09 MED ORDER — PSYLLIUM 58.12 % PO PACK
1.0000 | PACK | Freq: Two times a day (BID) | ORAL | 0 refills | Status: DC
  Filled 2022-02-09: qty 30, 15d supply, fill #0

## 2022-02-09 MED ORDER — AMLODIPINE BESYLATE 5 MG PO TABS
5.0000 mg | ORAL_TABLET | Freq: Every day | ORAL | Status: DC
  Administered 2022-02-09: 5 mg via ORAL
  Filled 2022-02-09 (×2): qty 1

## 2022-02-09 MED ORDER — HYDROXYZINE HCL 10 MG PO TABS
10.0000 mg | ORAL_TABLET | Freq: Three times a day (TID) | ORAL | Status: DC | PRN
Start: 2022-02-09 — End: 2022-02-09

## 2022-02-09 NOTE — Assessment & Plan Note (Deleted)
s/p cardioversion 01/28/2022. EKG showing sinus tachycardia in the ED, improved to NSR with LR bolus. - Continue Eliquis 5 mg twice daily - Continue 10 mg amiodarone daily - Continue telemetry monitoring overnight

## 2022-02-09 NOTE — ED Notes (Signed)
Pt called in expressing concern about her lab result and looking for clarity.  I reviewed.  Dr. Macario Golds note and reinforced that there is no concern requiring her return to hospital and she can continue with appt with Dr. Tommy Medal tomorrow.

## 2022-02-09 NOTE — ED Notes (Signed)
Pt continuing to experience loose stools. Shalhoub MD paged to initiate Cdiff precautions

## 2022-02-09 NOTE — ED Notes (Signed)
[facility] called for transport.  

## 2022-02-09 NOTE — Hospital Course (Addendum)
Patient presented with N/V/D x 3 days, most likely due to viral gastroenteritis vs. Functional GI disorder. Initially met SIRS criteria in ED but vital signs stabilized with 2 L LR and patient became generally well-appearing. CXR and CTAP showed no source of infection, CMP showed no electrolyte derangements or AKI, Blood cultures showed no growth <12 hours, C diff antigen and toxin were negative. Her dog died on the day her symptoms started and she was teary about this throughout the encounter, pointing to stress and anxiety as the source of functional diarrhea. She also described worsening of her neuropathy such that she cannot carry out ADLs appropriately. We are discharging her with 40 mg Protonix daily, Loperamide 2 mg PRN, and Metamucil, and counseled her on increasing fiber in her diet. For the neuropathy we increased to Gabapentin 600 mg PO TID PRN and counseled her to continue with her home health PT.

## 2022-02-09 NOTE — ED Notes (Signed)
Pt brief and linens soiled. Linens changed, peri care performed

## 2022-02-09 NOTE — Assessment & Plan Note (Addendum)
Nonbloody x3 days. Possibly viral gastroenteritis vs functional GI disorder. Although meeting SIRS criteria initially in ED with WBC 14.1 and Lactic acid 2.8, do not suspect sepsis due to stabilization of vital signs with LR, generally well-appearing patient, no clear source of infection.  Reassuring CT scan and chest x-ray showing no source of infection. Also reassuring is the lack of electrolyte derangements or AKI. Her anxiety may be contributing to this presentation, her dog died suddenly 09/15/22 which is when her symptoms began. Albumin 2.5 and Total protein 5.6. Blood cultures showed no growth <12 hours.  C diff antigen and toxin negative. - Continue 40 mg Protonix daily - Continue Imodium PRN - Trend BMP and replete electrolytes as necessary - Blood cultures pending

## 2022-02-09 NOTE — Telephone Encounter (Signed)
I received a message from the resident about a positive blood culture thought to be a contaminant. The patient had already been discharged from the hospital in a stable condition. I called and discussed her with Dr. Tommy Medal since she has an appointment with him tomorrow. The blood culture report was reviewed with him, and he also agreed this is likely a contaminant requiring no treatment. I called the patient about the test result. She is doing well with no new concerns. However, she was appropriately upset about being discharged before her final culture report. She is aware of her appointment with Dr. Tommy Medal tomorrow and will discuss this result with her. Return precaution was discussed, and she agreed with the plan.

## 2022-02-09 NOTE — ED Notes (Addendum)
DC instructions reviewed with pt. PT verbalized understanding.  Pt DC home with family in [place].

## 2022-02-09 NOTE — Discharge Summary (Addendum)
[facility] Discharge Summary  Patient name: Karen Casey Medical record number: [id number] Date of birth: [birth year] Age: 66 y.o. Gender: female Date of Admission: 02/08/2022  Date of Discharge: 02/09/22 Admitting Physician: Darci Current, DO  Primary Care Provider: [name], MD Consultants: none   Indication for Hospitalization: Met SIRS criteria initially with WBC 14.1 and Lactate 2.8   Brief Hospital Course:  Patient presented with N/V/D x 3 days, most likely due to viral gastroenteritis vs. Functional GI disorder. Initially met SIRS criteria in ED but vital signs stabilized with 2 L LR and patient became generally well-appearing. CXR and CTAP showed no source of infection, CMP showed no electrolyte derangements or AKI, Blood cultures showed no growth <12 hours, C diff antigen and toxin were negative. She also described worsening of her neuropathy such that she cannot carry out ADLs appropriately. We are discharging her with 40 mg Protonix daily, Loperamide 2 mg PRN, and Metamucil, and counseled her on increasing fiber in her diet. For the neuropathy we increased to Gabapentin 600 mg PO TID PRN.    Discharge Diagnoses/Problem List:  Principal Problem for Admission: Vomiting and diarrhea x 3 days  Disposition: Home with home health PT   Discharge Condition: Stable   Discharge Exam:  Vitals:   02/09/22 1200 02/09/22 1218  BP: 114/81   Pulse: 96   Resp: (!) 23   Temp:  97.8 F (36.6 C)  SpO2: 100%    Physical Exam: General: Well-appearing, in no acute distress, alert and oriented Cardiovascular: RRR S1 S2 present, no rubs, murmurs, or gallops.  Respiratory: CTA bilaterally, no increased WOB.  Abdomen: Soft, nondistended, diffusely tender to palpation, but without rebound or guarding. Without mass or organomegaly  Extremities: Sensation in plantar foot intact. Tenderness to palpation of feet.  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 Consider workup for osmotic vs. secretory vs. steatorrhea, ova and parasite examination, or colonoscopy. Otherwise, consider peppermint oil, counseling, or other pharmacological symptomatic management. Potentially can try an SSRI for functional diarrhea.  2. Possibly increase gabapentin dose for ongoing peripheral neuropathy, and/or add SNRI.  2. Needs to follow with home health PT, so far has not been working with them.    Significant Procedures: None  Significant Labs and Imaging:  Recent Labs  Lab 02/08/22 1024  WBC 14.1*  HGB 14.6  HCT 42.0  PLT 425*   Recent Labs  Lab 02/08/22 1024  NA 134*  K 3.6  CL 104  CO2 17*  GLUCOSE 120*  BUN 10  CREATININE 0.84  CALCIUM 8.6*  ALKPHOS 91  AST 34  ALT 20  ALBUMIN 2.5*    Results/Tests Pending at Time of Discharge: Blood cultures   Discharge Medications:  Allergies as of 02/09/2022       Reactions   Capsaicin-menthol Rash, Other (See Comments)   Burning and peeling on area applied to   Diclo Gel [diclofenac Sodium] Rash, Other (See Comments)   Burning and peeling at the sight of application.        Medication List     TAKE these medications    acetaminophen 650 MG CR tablet Commonly known as: TYLENOL Take 1 tablet (650 mg total) by mouth every 6 (six) hours as needed for pain.   albuterol 108 (90 Base) MCG/ACT inhaler Commonly known as: VENTOLIN HFA Inhale 2 puffs into the lungs every 6 (six) hours as  needed for wheezing or shortness of breath.   amiodarone 200 MG tablet Commonly known as: PACERONE Take 1 tablet (200 mg total) by mouth daily.   amLODipine 5 MG tablet Commonly known as: NORVASC Take 5 mg by mouth daily.   busPIRone 10 MG tablet Commonly known as: BUSPAR Take 10 mg by mouth 3 (three) times daily as needed (anxiety).   CALCIUM PO Take 1 tablet by mouth daily.   CENTRUM SILVER 50+WOMEN PO Take 1 tablet by  mouth daily.   Eliquis 5 MG Tabs tablet Generic drug: apixaban Take 1 tablet (5 mg total) by mouth 2 (two) times daily.   Ensure Original Liqd Take 1 Package by mouth daily as needed (loss of appetite). Chocolate only   FOLIC ACID PO Take 1 tablet by mouth daily.   gabapentin 600 MG tablet Commonly known as: NEURONTIN Take 1 tablet (600 mg total) by mouth 2 (two) times daily. What changed: when to take this   hydrOXYzine 10 MG tablet Commonly known as: ATARAX Take 1 tablet (10 mg total) by mouth 3 (three) times daily as needed for anxiety.   loperamide 2 MG capsule Commonly known as: IMODIUM Take 1 capsule (2 mg total) by mouth as needed for diarrhea or loose stools.   Metamucil [facility] Fiber 58.12 % Pack Generic drug: Psyllium Take 1 packet by mouth 2 (two) times daily.   nicotine 14 mg/24hr patch Commonly known as: NICODERM CQ - dosed in mg/24 hours Place 1 patch (14 mg total) onto the skin daily.   nortriptyline 25 MG capsule Commonly known as: PAMELOR Take 3 capsules (75 mg total) by mouth at bedtime. This was patient's prior to admission home dose that she was taking. What changed: additional instructions   ondansetron 4 MG tablet Commonly known as: Zofran Take 1 tablet (4 mg total) by mouth every 8 (eight) hours as needed for nausea or vomiting.   pantoprazole 40 MG tablet Commonly known as: PROTONIX Take 1 tablet (40 mg total) by mouth every morning. What changed:  when to take this reasons to take this   QUEtiapine 100 MG tablet Commonly known as: SEROQUEL Take 1 tablet (100 mg total) by mouth at bedtime.   Trelegy Ellipta 100-62.5-25 MCG/ACT Aepb Generic drug: Fluticasone-Umeclidin-Vilant Inhale 2 puffs into the lungs daily.   VITAMIN D3 PO Take 1 capsule by mouth daily.        Discharge Instructions: Please refer to Patient Instructions section of EMR for full details.  Patient was counseled important signs and symptoms that should prompt  return to medical care, changes in medications, dietary instructions, activity restrictions, and follow up appointments.   Follow-Up Appointments:  Follow-up Information     [place]. Go on 02/18/2022.   Why: Please go to your scheduled appointment at 10:10AM. Please arrive at least 15 minutes early. Contact information: [place]                Lupita Raider, Medical Student 02/09/2022, 12:23 PM [place]    I have evaluated this patient along with Student Dr. Orlena Sheldon and reviewed the above note, making necessary revisions.  Pearla Dubonnet, MD 02/09/2022, 12:45 PM PGY-2, [place]

## 2022-02-09 NOTE — Assessment & Plan Note (Addendum)
s/p cardioversion 01/28/2022. EKG showing sinus tachycardia in the ED, improved to NSR with LR bolus. - Continue Eliquis 5 mg twice daily - Continue 10 mg amiodarone daily - Continue telemetry monitoring

## 2022-02-09 NOTE — ED Notes (Signed)
Pt will go home by [facility]. Cleaned pt after stool for discharge. Pt's husband at home to receive her. D/c paperwork will be given to [facility]

## 2022-02-09 NOTE — ED Notes (Signed)
Changed pt's dressing on right arm.

## 2022-02-09 NOTE — Assessment & Plan Note (Addendum)
Patient is at baseline anxious.  Her dog died suddenly on 2022-09-07 which has caused her distress.  - Continue hydroxyzine 10 mg 3 times daily as needed - Continue 10 mg BuSpar - Continue home nortriptyline '75mg'$  daily

## 2022-02-09 NOTE — ED Notes (Signed)
Changed pt's brief- pt has continuing incontinent episodes of diarrhea.

## 2022-02-09 NOTE — Discharge Instructions (Signed)
Dear Karen Casey,  Thank you for letting us participate in your care. You were hospitalized for nausea, vomiting, and diarrhea. We were able to rule out some infectious causes of this. We are glad that you are able to keep food down and are feeling slightly better.   POST-HOSPITAL & CARE INSTRUCTIONS We are discharging you with medication that you can take for nausea. It is called Zofran and you can take it as needed every 8 hours. We are sending you with some fiber and loperamide for your diarrhea. Go to your follow up appointments (listed below)  DOCTOR'S APPOINTMENT   Future Appointments  Date Time Provider [place]  02/10/2022  9:30 AM Custovic, Collene Mares, DO [facility] None  02/10/2022  3:15 PM Tommy Medal, Lavell Islam, MD [facility]  02/18/2022 10:10 AM Hensel, Jamal Collin, MD [facility]    Follow-up Information     [place]. Go on 02/18/2022.   Why: Please go to your scheduled appointment at 10:10AM. Please arrive at least 15 minutes early. Contact information: [place] [phone]                Take care and be well!  [facility]  [place], [place] [address] [phone]

## 2022-02-09 NOTE — Progress Notes (Signed)
Transition of Care [facility]) - Emergency Department Mini Assessment   Patient Details  Name: Karen Casey MRN: [id number] Date of Birth: [birth year]  [facility]) CM/SW Contact:    Fuller Mandril, RN Phone Number: 02/09/2022, 2:34 PM   Clinical Narrative:  Pt currently active with [facility] for Home Health services as confirmed by [facility] with [facility].  Pt will resume [facility] services of RN/PT/OT.   ED Mini Assessment:    Barriers to Discharge: (P) Barriers Resolved             Patient Contact and Communications        ,          Patient states their goals for this hospitalization and ongoing recovery are:: (P) return home      Admission diagnosis:  Vomiting and diarrhea [R11.10, R19.7] Patient Active Problem List   Diagnosis Date Noted   Nausea vomiting and diarrhea 02/09/2022   Generalized abdominal pain    Unspecified protein-calorie malnutrition ([place]) 01/28/2022   Diarrhea 01/25/2022   Atrial fibrillation with RVR ([place]) 01/25/2022   Colitis    Bacteremia due to Enterococcus    Odynophagia 01/06/2022   Ambulatory dysfunction 01/06/2022   Sepsis ([place]) 01/02/2022   Encephalopathy acute    Acute respiratory failure with hypoxia ([place])    Aspiration pneumonia ([place]) 11/22/2021   Right rib fracture 11/20/2021   Elevated LFTs 11/20/2021   Osteoporosis 09/23/2021   Failure to thrive in adult 09/23/2021   Protein-calorie malnutrition, severe 07/28/2021   Vitamin D deficiency 07/03/2021   Peripheral neuropathy    Seborrheic keratoses 05/29/2021   Hepatic steatosis 01/05/2021   Hypokalemia 01/02/2021   COPD (chronic obstructive pulmonary disease) ([place]) 12/09/2020   Chronic pain of breast 11/12/2020   Compression fracture of body of thoracic vertebra ([place]) 11/06/2020   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   Hypomagnesemia    Protein-calorie malnutrition ([place]) 09/17/2020   Paroxysmal atrial fibrillation ([facility])    Shortness  of breath    Alcohol withdrawal ([place]) 09/10/2020   Pulmonary nodules/lesions, multiple 07/14/2020   HTN (hypertension) 06/02/2018   Left foot pain 08/23/2017   Oral candidiasis 12/20/2013   Body mass index (BMI) 31.0-31.9, adult 03/29/2013   Community acquired pneumonia 03/14/2013   Anxiety 06/12/2012   Alcohol use 05/28/2012   Benzodiazepine withdrawal without complication ([place]) 20/94/7096   Tobacco abuse 02/17/2012   GERD (gastroesophageal reflux disease) 02/04/2012   Enterocolitis 02/04/2012   Depression 02/04/2012   PCP:  [name], MD Pharmacy:   [id number] [id number]: [phone]: [phone] [place]Phone: [phone]: [phone] [place] [address]Phone: [phone]: [phone]

## 2022-02-09 NOTE — Progress Notes (Signed)
PHARMACY - PHYSICIAN COMMUNICATION CRITICAL VALUE ALERT - BLOOD CULTURE IDENTIFICATION (BCID)  Karen Casey is an 66 y.o. female who presented to [facility] on 02/08/2022 with a chief complaint of vomiting and diarrhea.  Assessment: C diff negative. CT scan and CXR not indicative of infection. 1/2 Bcx growing GPC in clusters, BCID identifying as staph spp (no organism identified).  Name of physician (or Provider) Contacted:  Andrena Mews, MD  Current antibiotics:  Cefepime 2g x 1 Vancomycin '1250mg'$  x 1  Changes to prescribed antibiotics recommended:  Patient has been discharged with plans to f/u outpatient due to low suspicion for bloodstream infection at this time.    Results for orders placed or performed during the hospital encounter of 02/08/22  Blood Culture ID Panel (Reflexed) (Collected: 02/08/2022  5:25 PM)  Result Value Ref Range   Enterococcus faecalis NOT DETECTED NOT DETECTED   Enterococcus Faecium NOT DETECTED NOT DETECTED   Listeria monocytogenes NOT DETECTED NOT DETECTED   Staphylococcus species DETECTED (A) NOT DETECTED   Staphylococcus aureus (BCID) NOT DETECTED NOT DETECTED   Staphylococcus epidermidis NOT DETECTED NOT DETECTED   Staphylococcus lugdunensis NOT DETECTED NOT DETECTED   Streptococcus species NOT DETECTED NOT DETECTED   Streptococcus agalactiae NOT DETECTED NOT DETECTED   Streptococcus pneumoniae NOT DETECTED NOT DETECTED   Streptococcus pyogenes NOT DETECTED NOT DETECTED   A.calcoaceticus-baumannii NOT DETECTED NOT DETECTED   Bacteroides fragilis NOT DETECTED NOT DETECTED   Enterobacterales NOT DETECTED NOT DETECTED   Enterobacter cloacae complex NOT DETECTED NOT DETECTED   Escherichia coli NOT DETECTED NOT DETECTED   Klebsiella aerogenes NOT DETECTED NOT DETECTED   Klebsiella oxytoca NOT DETECTED NOT DETECTED   Klebsiella pneumoniae NOT DETECTED NOT DETECTED   Proteus species NOT DETECTED NOT DETECTED   Salmonella species NOT DETECTED NOT  DETECTED   Serratia marcescens NOT DETECTED NOT DETECTED   Haemophilus influenzae NOT DETECTED NOT DETECTED   Neisseria meningitidis NOT DETECTED NOT DETECTED   Pseudomonas aeruginosa NOT DETECTED NOT DETECTED   Stenotrophomonas maltophilia NOT DETECTED NOT DETECTED   Candida albicans NOT DETECTED NOT DETECTED   Candida auris NOT DETECTED NOT DETECTED   Candida glabrata NOT DETECTED NOT DETECTED   Candida krusei NOT DETECTED NOT DETECTED   Candida parapsilosis NOT DETECTED NOT DETECTED   Candida tropicalis NOT DETECTED NOT DETECTED   Cryptococcus neoformans/gattii NOT DETECTED NOT DETECTED    Billey Gosling, PharmD PGY1 Pharmacy Resident 8/29/20234:26 PM

## 2022-02-09 NOTE — Progress Notes (Addendum)
Daily Progress Note Intern Pager: [phone]  Patient name: Karen Casey Medical record number: [id number] Date of birth: 18-Feb-1956 Age: 66 y.o. Gender: female  Primary Care Provider: [name], MD Consultants: none Code Status: FULL  Pt Overview and Major Events to Date:  8/28 - admitted  Assessment and Plan: Karen Casey is a 66 y.o. female with PMH of COPD, GERD, Paroxysmal A-fib, ethanol and tobacco abuse, anxiety/depression admitted for nausea, vomiting, and diarrhea for 3 days. She has had multiple admissions in the past with these symptoms, and this amount of diarrhea is baseline for her, pointing to a functional bowel disorder.   * Nausea vomiting and diarrhea Nonbloody x3 days. Possibly viral gastroenteritis vs functional GI disorder. Although meeting SIRS criteria initially in ED with WBC 14.1 and Lactic acid 2.8, do not suspect sepsis due to stabilization of vital signs with LR, generally well-appearing patient, no clear source of infection.  Reassuring CT scan and chest x-ray showing no source of infection. Also reassuring is the lack of electrolyte derangements or AKI. Her anxiety may be contributing to this presentation, her dog died suddenly 2022-07-24 which is when her symptoms began. Albumin 2.5 and Total protein 5.6. Blood cultures showed no growth <12 hours.  C diff antigen and toxin negative. - Continue 40 mg Protonix daily - Continue Imodium PRN - Trend BMP and replete electrolytes as necessary - Blood cultures pending  Anxiety Patient is at baseline anxious.  Her dog died suddenly on 07-25-2022 which has caused her distress.  - Continue hydroxyzine 10 mg 3 times daily as needed - Continue 10 mg BuSpar - Continue home nortriptyline 73m daily  Paroxysmal atrial [place] s[place]p cardioversion 01/28/2022. EKG showing sinus tachycardia in the ED, improved to NSR with LR bolus. - Continue Eliquis 5 mg twice daily - Continue 10 mg amiodarone  daily - Continue telemetry monitoring    FEN/GI: Regular diet as tolerated PPx: On Eliquis for A-fib Dispo:Home today.   Subjective:  Patient was seen at bedside in the ED. Throughout encounter her affect was teary and vulnerable and then would quickly switch to annoyance and anger. She started crying any time she was asked about her dog. She describes "pooping herself constantly" but denies any vomiting since she has been here. She was able to keep down some fluids PO. We asked what she needed and she said she can't walk from her neuropathy and her husband has to carry her everywhere so she needs [place] get her walking again so she can carry out ADLs. We asked her if she has met with physical therapy and she said she did not want to discuss PT at all. She has had home health PT in the past but it did not work out for reason she was unwilling to discuss. Her gabapentin does not appear to be working.   Objective: Temp:  [97.6 F (36.4 C)-98.4 F (36.9 C)] 97.6 F (36.4 C) (08/29 0630) Pulse Rate:  [73-114] 92 (08/29 1015) Resp:  [12-33] 23 (08/29 1015) BP: (121-167)/(53-118) 135/94 (08/29 1015) SpO2:  [97 %-100 %] 97 % (08/29 1015) Weight:  [59 kg] 59 kg (08/28 1501) Physical Exam: General: Well-appearing, in no acute distress, alert and oriented Cardiovascular: RRR S1 S2 present, no rubs, murmurs, or gallops.  Respiratory: CTA bilaterally, no increased WOB.  Abdomen: Soft, nondistended, diffusely tender to palpation  Extremities: Sensation in plantar foot intact. Tenderness to palpation of feet.  Psych: Anxious mood with teary  and angry affect. Does not seem to have insight to her mental condition.   Laboratory: Most recent CBC Lab Results  Component Value Date   WBC 14.1 (H) 02/08/2022   HGB 14.6 02/08/2022   HCT 42.0 02/08/2022   MCV 91.5 02/08/2022   PLT 425 (H) 02/08/2022   Most recent BMP    Latest Ref Rng & Units 02/08/2022   10:24 AM  BMP  Glucose 70 - 99 mg/dL 120    BUN 8 - 23 mg/dL 10   Creatinine 0.44 - 1.00 mg/dL 0.84   Sodium 135 - 145 mmol/L 134   Potassium 3.5 - 5.1 mmol/L 3.6   Chloride 98 - 111 mmol/L 104   CO2 22 - 32 mmol/L 17   Calcium 8.9 - 10.3 mg/dL 8.6     Other pertinent labs Lactic acid 2.8  UA revealed large hemoglobin, trace leukocytes and few bacteria   Imaging/Diagnostic Tests: CXR Radiologist Impression: No active disease  My interpretation: No evidence of pulmonary infiltrates or consolidation.  CTAP w contrast  IMPRESSION: No evidence of bowel obstruction. Prior appendectomy. No CT findings to account for the patient's abdominal pain.   Lupita Raider, Medical Student 02/09/2022, 11:58 AM  [place] Intern pager: [phone], text pages welcome Secure chat group [place] Upper-Level Resident Addendum   I have independently interviewed and examined the patient. I have discussed the above with the original author and agree with their documentation. My edits for correction/addition/clarification are included where appropriate. Please see also any attending notes.   Sharion Settler, DO PGY-3, [facility] 02/09/2022 12:13 PM  FPTS Service pager: [phone] (text pages welcome through [place])

## 2022-02-09 NOTE — Telephone Encounter (Addendum)
Patient recently admitted to our service for NVD. She was tolerating PO by time of discharge and appeared back to her baseline, VSS.   Notified by Pharmacy that one of two blood cultures returned positive for staph. Suspect likely a contaminant. Patient had already been discharged prior to this notification. Attempted to call patient to discuss the lab findings with her but she did not answer. She is actually scheduled for an appointment with infectious disease tomorrow (8/30) at 9:30 AM with Dr. Tommy Medal.   I have also arranged closer follow up for her to be seen in our clinic on Friday, 9/1 at 11AM. Will try to reach out to her again tomorrow to follow up and team will follow up on blood cultures.

## 2022-02-10 ENCOUNTER — Telehealth: Payer: Self-pay | Admitting: Family Medicine

## 2022-02-10 ENCOUNTER — Ambulatory Visit: Payer: Medicaid Other | Admitting: Internal Medicine

## 2022-02-10 ENCOUNTER — Ambulatory Visit: Payer: Commercial Managed Care - HMO | Admitting: Infectious Disease

## 2022-02-10 ENCOUNTER — Other Ambulatory Visit (HOSPITAL_COMMUNITY): Payer: Self-pay

## 2022-02-10 ENCOUNTER — Encounter: Payer: Self-pay | Admitting: Infectious Disease

## 2022-02-10 DIAGNOSIS — R7881 Bacteremia: Secondary | ICD-10-CM

## 2022-02-10 DIAGNOSIS — B952 Enterococcus as the cause of diseases classified elsewhere: Secondary | ICD-10-CM

## 2022-02-10 HISTORY — DX: Bacteremia: B95.2

## 2022-02-10 HISTORY — DX: Bacteremia: R78.81

## 2022-02-10 NOTE — Telephone Encounter (Signed)
Discussed blood culture results with Karen Casey. Discussed that only one blood culture was drawn and it showed a common Skin species Staph that is often a contaminant, and that none of the more serious Staph organisms were found like Staph aureus, which is why inpatient team felt she did not need to go back to the hospital as this is likely a contaminant. She is feeling well and denies other symptoms. Discussed that she has an appt at 315 PM with Karen Casey this afternoon and he plans to repeat the blood cultures just to be sure. Discussed that if she starts having fevers, chills, vomiting, diarrhea she needs to go back to the hospital immediately.  Answered all questions. Karen Savannah MD

## 2022-02-10 NOTE — Progress Notes (Deleted)
Subjective:    Patient ID: Karen Casey, female    DOB: 1955/11/22, 66 y.o.   MRN: [id number]  HPI 66 y.o. female withwith past med history significant for alcoholism which she stopped 6 to 8 months ago and went through significant withdrawal paroxysmal atrial fibrillation tobacco abuse COPD anxiety depression who presented to [facility] with uncontrollable diarrhea along with nausea and vomiting.  CT in the ER showed evidence of enterocolitis.  Cultures were taken and the patient was placed on ceftriaxone and tonight is all.  She has improved clinically IV access with subsequent loss one of the 2 admission blood cultures is now growing Enterococcus avium.   Without IV access we started zyvox and held her buspar   She was to go out on zyvox but could not obtain this and she was rx augmentin though ? Whether she took this or not. She was then admitted for nausea, vomiting and diarrhea in August and seen by my partner Dr. West Bali.    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   Seizure ([place])    08/2020 per patient   Seizures ([place])    xanax withdrawl- December 2013   Urinary incontinence 06/09/2012    Past Surgical History:  Procedure Laterality Date   APPENDECTOMY     BIOPSY  01/04/2021   Procedure: BIOPSY;  Surgeon: Yetta Flock, MD;  Location: [place];  Service: Gastroenterology;;   BIOPSY  03/12/2021   Procedure:  BIOPSY;  Surgeon: Irving Copas., MD;  Location: [place];  Service: Gastroenterology;;   CARDIOVERSION N/A 01/28/2022   Procedure: CARDIOVERSION;  Surgeon: Adrian Prows, MD;  Location: [place];  Service: Cardiovascular;  Laterality: N/A;   CATARACT EXTRACTION  06/09/2012   Left eye   COLONOSCOPY WITH PROPOFOL N/A 03/12/2021   Procedure: COLONOSCOPY WITH PROPOFOL;  Surgeon: Irving Copas., MD;  Location: [place];  Service: Gastroenterology;  Laterality: N/A;   ESOPHAGOGASTRODUODENOSCOPY (EGD) WITH PROPOFOL N/A 01/04/2021   Procedure: ESOPHAGOGASTRODUODENOSCOPY (EGD) WITH PROPOFOL;  Surgeon: Yetta Flock, MD;  Location: [place];  Service: Gastroenterology;  Laterality: N/A;   left shoulder dislocation  Sept 2011   POLYPECTOMY  03/12/2021   Procedure: POLYPECTOMY;  Surgeon: Mansouraty, Telford Nab., MD;  [facility] END[facility]COPY;  Service: Gastroenterology;;    Family History  Problem Relation Age of Onset   Hypertension Mother    Hyperlipidemia Mother    Aneurysm Mother        Rupture - Cause of death   Heart disease Father        MI - cause of death   Depression Father    Parkinsonism Father    Hypertension Sister    Colon cancer Neg Hx    Esophageal cancer Neg Hx    Rectal cancer Neg Hx    Stomach cancer Neg  Hx       Social History   Socioeconomic History   Marital status: Single    Spouse name: Not on file   Number of children: 1   Years of education: Not on file   Highest education level: Not on file  Occupational History   Not on file  Tobacco Use   Smoking status: Every Day    Packs/day: 0.50    Years: 43.00    Total pack years: 21.50    Types: Cigarettes   Smokeless tobacco: Never   Tobacco comments:    Tobacco info given  Vaping Use   Vaping Use: Never used  Substance and Sexual Activity   Alcohol use: Yes    Alcohol/week: 3.0 standard drinks of alcohol    Types: 3 Cans of beer per week    Comment: 1-2 times week just  beer   Drug use: Yes    Types: Marijuana, Other-see comments    Comment: Past hx of benzo abuse--xanax   Sexual activity: Not on file  Other Topics Concern   Not on file  Social History Narrative   Not on file   Social Determinants of Health   Financial Resource Strain: Not on file  Food Insecurity: No Food Insecurity (12/25/2021)   Hunger Vital Sign    Worried About Running Out of Food in the Last Year: Never true    Ran Out of Food in the Last Year: Never true  Transportation Needs: Not on file  Physical Activity: Not on file  Stress: Not on file  Social Connections: Not on file    Allergies  Allergen Reactions   Capsaicin-Menthol Rash and Other (See Comments)    Burning and peeling on area applied to   Diclo Gel [Diclofenac Sodium] Rash and Other (See Comments)    Burning and peeling at the sight of application.     Current Outpatient Medications:    acetaminophen (TYLENOL) 650 MG CR tablet, Take 1 tablet (650 mg total) by mouth every 6 (six) hours as needed for pain., Disp: , Rfl:    albuterol (VENTOLIN HFA) 108 (90 Base) MCG/ACT inhaler, Inhale 2 puffs into the lungs every 6 (six) hours as needed for wheezing or shortness of breath., Disp: 1 each, Rfl: 2   amiodarone (PACERONE) 200 MG tablet, Take 1 tablet (200 mg total) by mouth daily., Disp: 30 tablet, Rfl: 0   amLODipine (NORVASC) 5 MG tablet, Take 5 mg by mouth daily., Disp: , Rfl:    apixaban (ELIQUIS) 5 MG TABS tablet, Take 1 tablet (5 mg total) by mouth 2 (two) times daily., Disp: 60 tablet, Rfl: 0   busPIRone (BUSPAR) 10 MG tablet, Take 10 mg by mouth 3 (three) times daily as needed (anxiety)., Disp: , Rfl:    CALCIUM PO, Take 1 tablet by mouth daily., Disp: , Rfl:    Cholecalciferol (VITAMIN D3 PO), Take 1 capsule by mouth daily., Disp: , Rfl:    Fluticasone-Umeclidin-Vilant (TRELEGY ELLIPTA) 100-62.5-25 MCG/ACT AEPB, Inhale 2 puffs into the lungs daily., Disp: , Rfl:    FOLIC ACID PO, Take 1 tablet by mouth  daily., Disp: , Rfl:    gabapentin (NEURONTIN) 600 MG tablet, Take 1 tablet (600 mg total) by mouth 2 (two) times daily., Disp: 90 tablet, Rfl: 1   hydrOXYzine (ATARAX) 10 MG tablet, Take 1 tablet (10 mg total) by mouth 3 (three) times daily as needed for anxiety., Disp: 30 tablet, Rfl: 0   loperamide (IMODIUM) 2 MG capsule, Take  1 capsule (2 mg total) by mouth as needed for diarrhea or loose stools., Disp: 30 capsule, Rfl: 0   Multiple Vitamins-Minerals (CENTRUM SILVER 50+WOMEN PO), Take 1 tablet by mouth daily., Disp: , Rfl:    nicotine (NICODERM CQ - DOSED IN MG/24 HOURS) 14 mg/24hr patch, Place 1 patch (14 mg total) onto the skin daily. (Patient not taking: Reported on 02/09/2022), Disp: 28 patch, Rfl: 0   nortriptyline (PAMELOR) 25 MG capsule, Take 3 capsules (75 mg total) by mouth at bedtime. This was patient's prior to admission home dose that she was taking. (Patient taking differently: Take 75 mg by mouth at bedtime.), Disp: , Rfl:    Nutritional Supplements (ENSURE ORIGINAL) LIQD, Take 1 Package by mouth daily as needed (loss of appetite). Chocolate only, Disp: , Rfl:    ondansetron (ZOFRAN) 4 MG tablet, Take 1 tablet (4 mg total) by mouth every 8 (eight) hours as needed for nausea or vomiting., Disp: 30 tablet, Rfl: 1   pantoprazole (PROTONIX) 40 MG tablet, Take 1 tablet (40 mg total) by mouth every morning. (Patient taking differently: Take 40 mg by mouth daily as needed (acid reflux).), Disp: 30 tablet, Rfl: 0   Psyllium 58.12 % PACK, Take 1 packet by mouth 2 (two) times daily., Disp: 60 each, Rfl: 0   QUEtiapine (SEROQUEL) 100 MG tablet, Take 1 tablet (100 mg total) by mouth at bedtime., Disp: 30 tablet, Rfl: 1    Review of Systems     Objective:   Physical Exam        Assessment & Plan:

## 2022-02-11 LAB — CULTURE, BLOOD (ROUTINE X 2)

## 2022-02-12 ENCOUNTER — Telehealth: Payer: Self-pay | Admitting: *Deleted

## 2022-02-12 ENCOUNTER — Telehealth: Payer: Self-pay

## 2022-02-12 ENCOUNTER — Ambulatory Visit (INDEPENDENT_AMBULATORY_CARE_PROVIDER_SITE_OTHER): Payer: Commercial Managed Care - HMO | Admitting: Student

## 2022-02-12 DIAGNOSIS — R112 Nausea with vomiting, unspecified: Secondary | ICD-10-CM

## 2022-02-12 DIAGNOSIS — R197 Diarrhea, unspecified: Secondary | ICD-10-CM

## 2022-02-12 NOTE — Progress Notes (Signed)
Patient was a no-show for the visit

## 2022-02-12 NOTE — Telephone Encounter (Signed)
Post ED Visit - Positive Culture Follow-up  Culture report reviewed by antimicrobial stewardship pharmacist: [place] Team '[]'$  Elenor Quinones, Pharm.D. '[]'$  Heide Guile, Pharm.D., BCPS AQ-ID '[]'$  Parks Neptune, Pharm.D., BCPS '[]'$  Alycia Rossetti, Pharm.D., BCPS '[]'$  [place], [place].D., BCPS, AAHIVP '[]'$  Legrand Como, Pharm.D., BCPS, AAHIVP '[]'$  Salome Arnt, PharmD, BCPS '[]'$  Johnnette Gourd, PharmD, BCPS '[]'$  Hughes Better, PharmD, BCPS '[]'$  Leeroy Cha, PharmD '[]'$  Laqueta Linden, PharmD, BCPS '[]'$  Albertina Parr, PharmD  [place] Team '[]'$  Leodis Sias, PharmD '[]'$  Lindell Spar, PharmD '[]'$  Royetta Asal, PharmD '[]'$  Graylin Shiver, Rph '[]'$  Rema Fendt) Glennon Mac, PharmD '[]'$  Arlyn Dunning, PharmD '[]'$  [name], PharmD '[]'$  Dia Sitter, PharmD '[]'$  Leone Haven, PharmD '[]'$  Gretta Arab, PharmD '[]'$  Theodis Shove, PharmD '[]'$  Peggyann Juba, PharmD '[]'$  Reuel Boom, PharmD   Positive blood culture Already addressed by Dr. Thompson Grayer on 02/10/2022 and no further patient follow-up is required at this time. [place], Pharm D  Ardeen Fillers 02/12/2022, 9:16 AM

## 2022-02-12 NOTE — Telephone Encounter (Signed)
[facility] PT calls nurse line requesting VO for [facility] PT as follows.   2 week 2 1 week 2   VO given.

## 2022-02-16 ENCOUNTER — Ambulatory Visit: Payer: Medicaid Other | Admitting: Internal Medicine

## 2022-02-18 ENCOUNTER — Encounter: Payer: Self-pay | Admitting: Family Medicine

## 2022-02-18 ENCOUNTER — Ambulatory Visit (INDEPENDENT_AMBULATORY_CARE_PROVIDER_SITE_OTHER): Payer: Commercial Managed Care - HMO | Admitting: Family Medicine

## 2022-02-18 VITALS — BP 125/78 | HR 91

## 2022-02-18 DIAGNOSIS — R1084 Generalized abdominal pain: Secondary | ICD-10-CM

## 2022-02-18 DIAGNOSIS — R197 Diarrhea, unspecified: Secondary | ICD-10-CM

## 2022-02-18 DIAGNOSIS — F419 Anxiety disorder, unspecified: Secondary | ICD-10-CM

## 2022-02-18 DIAGNOSIS — G621 Alcoholic polyneuropathy: Secondary | ICD-10-CM | POA: Diagnosis not present

## 2022-02-18 DIAGNOSIS — R112 Nausea with vomiting, unspecified: Secondary | ICD-10-CM

## 2022-02-18 DIAGNOSIS — R1310 Dysphagia, unspecified: Secondary | ICD-10-CM

## 2022-02-18 MED ORDER — GABAPENTIN 600 MG PO TABS: 600.0000 mg | ORAL_TABLET | Freq: Three times a day (TID) | ORAL | 1 refills | Status: DC

## 2022-02-18 NOTE — Progress Notes (Signed)
    SUBJECTIVE:   CHIEF COMPLAINT / HPI:   FU hospitalization for anxiety and GI distress.  Patient with longstanding anxiety and depression and nausea was hospitalized, mainly due to nausea.  In hospital work up did not provide a clear,, objective diagnosis.Since discharge her main complaints are: Severe anxiety.  They're not giving me anything for my nerves.  (On buspar, nortryptyline, hydroxyzine and seroquel.) Nausea, abd pain and poor PO intake resulting in weight loss.  Wt are all over the place.  It seems she has had significant weight loss over the past several months.   Dysphagia.  EGD one year ago.  Takes small bites and chews well.  Still must sometimes spit food out.  Want to go back to GI.  Does have daily PPI. Neuropathy.  Electric pains all over.  States that BID gabapentin helped some with her pains.  Did not help with her anxiety. Poor social situation.  States too weak to get out of wheelchair.  Is receiving home PT.  Live with Elenore Rota, sig other of 35 year.  They fuss at each other quite a bit.  She feels safe.  Dog died.  Both miss dog terribly    OBJECTIVE:   BP 125/78 (BP Location: Left Arm, Cuff Size: Normal)   Pulse 91   SpO2 100%   Thin and frail  Anxious. Lungs clear Cardiac RRR without m or g  ASSESSMENT/PLAN:   Dysphagia Refer back to GI.  Suspect more esophageal dysmotility than esophagitis or obstruction.    Nausea vomiting and diarrhea Sounds slightly better since discharge.  Also seems to have a clear relationship between worsening stress and worsening symptoms.    Generalized abdominal pain Maybe part of overall electric pains in body.  Since she has gotten some relief, will increase gabapentin.  Anxiety Hopefully, gabapentin increase will have some effect on anxiety.  We agreed on one change at a time approach with frequent follow up.     Zenia Resides, MD [place]

## 2022-02-18 NOTE — Assessment & Plan Note (Signed)
Sounds slightly better since discharge.  Also seems to have a clear relationship between worsening stress and worsening symptoms.

## 2022-02-18 NOTE — Assessment & Plan Note (Signed)
Maybe part of overall electric pains in body.  Since she has gotten some relief, will increase gabapentin.

## 2022-02-18 NOTE — Assessment & Plan Note (Signed)
Refer back to GI.  Suspect more esophageal dysmotility than esophagitis or obstruction.

## 2022-02-18 NOTE — Patient Instructions (Addendum)
One change is to increase your gabapentin to 3 times per day since it seems to help you.  Next visit we will ask again about the gabapentin.  You have room to increase the dose even more.   See Dr. Thompson Grayer (or me) in 2-3 weeks and we will make another change.  One change at a time.  I put in a referral to the GI doctor.  Someone should call.

## 2022-02-18 NOTE — Assessment & Plan Note (Addendum)
Hopefully, gabapentin increase will have some effect on anxiety.  We agreed on one change at a time approach with frequent follow up.

## 2022-02-24 ENCOUNTER — Telehealth: Payer: Self-pay | Admitting: Gastroenterology

## 2022-02-24 DIAGNOSIS — R112 Nausea with vomiting, unspecified: Secondary | ICD-10-CM

## 2022-02-24 DIAGNOSIS — R1084 Generalized abdominal pain: Secondary | ICD-10-CM

## 2022-02-24 DIAGNOSIS — K529 Noninfective gastroenteritis and colitis, unspecified: Secondary | ICD-10-CM

## 2022-02-24 NOTE — Progress Notes (Deleted)
Subjective:    Patient ID: Karen Casey, female    DOB: 04/04/1956, 66 y.o.   MRN: [id number]  HPI   Past Medical History:  Diagnosis Date   Alcohol abuse    last use 03/09/21, marijuana last 03/09/21   Allergy    Anxiety    Cataract 06/09/2012   Right eye and left eye   COPD (chronic obstructive pulmonary disease) ([place])    Depression    Drug withdrawal seizure with complication ([place]) 7/82/9562   Due to benzodiazepine withdrawal   Dyspnea    uses oxygen 2L via [place] prn   Enterococcal bacteremia 02/10/2022   GERD (gastroesophageal reflux disease)    Headache    Hypertension    Long term (current) use of anticoagulants    Neuromuscular disorder ([place])    Positive blood culture 02/10/2022   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   Seizure ([place])    08/2020 per patient   Seizures ([place])    xanax withdrawl- December 2013   Urinary incontinence 06/09/2012    Past Surgical History:  Procedure Laterality Date   APPENDECTOMY     BIOPSY  01/04/2021   Procedure: BIOPSY;  Surgeon: Yetta Flock, MD;  Location: [facility];  Service: Gastroenterology;;   BIOPSY  03/12/2021   Procedure: BIOPSY;  Surgeon: Irving Copas., MD;  Location: [facility];  Service: Gastroenterology;;   CARDIOVERSION N/A 01/28/2022   Procedure: CARDIOVERSION;  Surgeon: Adrian Prows, MD;  Location: [facility];  Service: Cardiovascular;  Laterality: N/A;   CATARACT EXTRACTION  06/09/2012   Left eye   COLONOSCOPY WITH PROPOFOL N/A 03/12/2021   Procedure: COLONOSCOPY WITH PROPOFOL;  Surgeon: Irving Copas., MD;  Location: [facility];  Service: Gastroenterology;  Laterality: N/A;   ESOPHAGOGASTRODUODENOSCOPY (EGD) WITH PROPOFOL N/A 01/04/2021   Procedure: ESOPHAGOGASTRODUODENOSCOPY (EGD) WITH PROPOFOL;  Surgeon: Yetta Flock, MD;  Location: [facility];  Service: Gastroenterology;  Laterality: N/A;   left shoulder dislocation  Sept 2011   POLYPECTOMY  03/12/2021    Procedure: POLYPECTOMY;  Surgeon: Mansouraty, Telford Nab., MD;  Location: [facility] ENDOSCOPY;  Service: Gastroenterology;;    Family History  Problem Relation Age of Onset   Hypertension Mother    Hyperlipidemia Mother    Aneurysm Mother        Rupture - Cause of death   Heart disease Father        MI - cause of death   Depression Father    Parkinsonism Father    Hypertension Sister    Colon cancer Neg Hx    Esophageal cancer Neg Hx    Rectal cancer Neg Hx    Stomach cancer Neg Hx       Social History   Socioeconomic History   Marital status: Single    Spouse name: Not on file   Number of children: 1   Years of education: Not on file   Highest education level: Not on file  Occupational History   Not on file  Tobacco Use   Smoking status: Every Day    Packs/day: 0.50    Years: 43.00    Total pack years: 21.50    Types: Cigarettes   Smokeless tobacco: Never   Tobacco comments:    Tobacco info given  Vaping Use   Vaping Use: Never used  Substance and Sexual Activity   Alcohol use: Yes    Alcohol/week: 3.0 standard drinks of alcohol    Types: 3 Cans of beer  per week    Comment: 1-2 times week just beer   Drug use: Yes    Types: Marijuana, Other-see comments    Comment: Past hx of benzo abuse--xanax   Sexual activity: Not on file  Other Topics Concern   Not on file  Social History Narrative   Not on file   Social Determinants of Health   Financial Resource Strain: Not on file  Food Insecurity: No Food Insecurity (12/25/2021)   Hunger Vital Sign    Worried About Running Out of Food in the Last Year: Never true    Ran Out of Food in the Last Year: Never true  Transportation Needs: Not on file  Physical Activity: Not on file  Stress: Not on file  Social Connections: Not on file    Allergies  Allergen Reactions   Capsaicin-Menthol Rash and Other (See Comments)    Burning and peeling on area applied to   Diclo Gel [Diclofenac Sodium] Rash and Other (See  Comments)    Burning and peeling at the sight of application.     Current Outpatient Medications:    acetaminophen (TYLENOL) 650 MG CR tablet, Take 1 tablet (650 mg total) by mouth every 6 (six) hours as needed for pain., Disp: , Rfl:    albuterol (VENTOLIN HFA) 108 (90 Base) MCG/ACT inhaler, Inhale 2 puffs into the lungs every 6 (six) hours as needed for wheezing or shortness of breath., Disp: 1 each, Rfl: 2   amiodarone (PACERONE) 200 MG tablet, Take 1 tablet (200 mg total) by mouth daily., Disp: 30 tablet, Rfl: 0   amLODipine (NORVASC) 5 MG tablet, Take 5 mg by mouth daily., Disp: , Rfl:    apixaban (ELIQUIS) 5 MG TABS tablet, Take 1 tablet (5 mg total) by mouth 2 (two) times daily., Disp: 60 tablet, Rfl: 0   busPIRone (BUSPAR) 10 MG tablet, Take 10 mg by mouth 3 (three) times daily as needed (anxiety)., Disp: , Rfl:    CALCIUM PO, Take 1 tablet by mouth daily., Disp: , Rfl:    Cholecalciferol (VITAMIN D3 PO), Take 1 capsule by mouth daily., Disp: , Rfl:    Fluticasone-Umeclidin-Vilant (TRELEGY ELLIPTA) 100-62.5-25 MCG/ACT AEPB, Inhale 2 puffs into the lungs daily., Disp: , Rfl:    FOLIC ACID PO, Take 1 tablet by mouth daily., Disp: , Rfl:    gabapentin (NEURONTIN) 600 MG tablet, Take 1 tablet (600 mg total) by mouth 3 (three) times daily., Disp: 90 tablet, Rfl: 1   hydrOXYzine (ATARAX) 10 MG tablet, Take 1 tablet (10 mg total) by mouth 3 (three) times daily as needed for anxiety., Disp: 30 tablet, Rfl: 0   loperamide (IMODIUM) 2 MG capsule, Take 1 capsule (2 mg total) by mouth as needed for diarrhea or loose stools., Disp: 30 capsule, Rfl: 0   Multiple Vitamins-Minerals (CENTRUM SILVER 50+WOMEN PO), Take 1 tablet by mouth daily., Disp: , Rfl:    nicotine (NICODERM CQ - DOSED IN MG/24 HOURS) 14 mg/24hr patch, Place 1 patch (14 mg total) onto the skin daily. (Patient not taking: Reported on 02/09/2022), Disp: 28 patch, Rfl: 0   nortriptyline (PAMELOR) 25 MG capsule, Take 3 capsules (75 mg  total) by mouth at bedtime. This was patient's prior to admission home dose that she was taking. (Patient taking differently: Take 75 mg by mouth at bedtime.), Disp: , Rfl:    Nutritional Supplements (ENSURE ORIGINAL) LIQD, Take 1 Package by mouth daily as needed (loss of appetite). Chocolate only, Disp: , Rfl:  ondansetron (ZOFRAN) 4 MG tablet, Take 1 tablet (4 mg total) by mouth every 8 (eight) hours as needed for nausea or vomiting., Disp: 30 tablet, Rfl: 1   pantoprazole (PROTONIX) 40 MG tablet, Take 1 tablet (40 mg total) by mouth every morning. (Patient taking differently: Take 40 mg by mouth daily as needed (acid reflux).), Disp: 30 tablet, Rfl: 0   Psyllium 58.12 % PACK, Take 1 packet by mouth 2 (two) times daily., Disp: 60 each, Rfl: 0   QUEtiapine (SEROQUEL) 100 MG tablet, Take 1 tablet (100 mg total) by mouth at bedtime., Disp: 30 tablet, Rfl: 1    Review of Systems     Objective:   Physical Exam        Assessment & Plan:

## 2022-02-24 NOTE — Telephone Encounter (Signed)
Returned call to patient. She states that she has been having dysphagia for about a month. Pt states that when she tries to eat solid foods she can't get her food to go down. Pt states that she is nauseous all of the time, no vomiting. She is mostly only liquids only, pt states that she has been trying to stay hydrated with Gatorade. She has been previously hospitalized due to dehydration. Pt has a prescription for Zofran, but she stated that "it does not help that much". Pt also reports diarrhea for the last 6-8 months. Pt no longer has RX for Lomotil. Pt reports about 6-8 watery stools daily. Pt describes stools as "black, watery, looks like tar". Pt denies any blood in the stool. No SOB or dizziness, she does have some fatigue. Pt states that she takes 4 Imodium which helps slow down the diarrhea. She does not take fiber supplements, she stated that these made her symptoms worse. She is on a multivitamin with iron in it. No recent Pepto-bismol use. I asked patient if she has had any significant weight loss and she stated that she has not been able to stand up on scales due to neuropathy. Pt denies any fever. Pt has been advised that she has not been seen in almost a year so I will check with you to see if you are able to provide any recommendations at this time. Pt had labs and C. Diff stool study on 8/28 and 8/29. Please advise, thanks.

## 2022-02-24 NOTE — Telephone Encounter (Signed)
Inbound call from patient stating that she is having issues with swallowing and having diarrhea. Patient was scheduled to see Alonza Bogus on 10/13 at 3:00 and is requesting a callback from the nurse to discuss issues. Please advise.

## 2022-02-25 ENCOUNTER — Ambulatory Visit: Payer: Commercial Managed Care - HMO | Admitting: Infectious Disease

## 2022-02-25 DIAGNOSIS — I4891 Unspecified atrial fibrillation: Secondary | ICD-10-CM

## 2022-02-25 DIAGNOSIS — I480 Paroxysmal atrial fibrillation: Secondary | ICD-10-CM

## 2022-02-25 DIAGNOSIS — R7881 Bacteremia: Secondary | ICD-10-CM

## 2022-02-25 NOTE — Telephone Encounter (Signed)
Lm on vm for patient to return call 

## 2022-02-25 NOTE — Telephone Encounter (Signed)
She had an extensive work-up for both dysphagia and diarrhea in 2022. I see that there has been multiple notes in the ER and hospital admissions in recent months.  The Imodium does not seem to be controlling her diarrhea, I can prescribe Lomotil.  She has an upcoming clinic appointment, and it is difficult to give further treatment advice in the interim since she has not been seen since November 2022.  I am hopeful that she stopped drinking alcohol.  Recommend a fecal elastase and a 4-hour gastric emptying study prior to the upcoming clinic  - HD

## 2022-02-25 NOTE — Telephone Encounter (Signed)
Pt returned call. We reviewed Dr. Loletha Carrow recommendations as outlined below. Pt has been advised that Dr. Loletha Carrow will send in RX for Lomotil to her pharmacy on file. Pt has been advised to stop by the lab in the basement at her convenience to pick up stool kit and instructions. Pt is aware that we have ordered the GES and knows to expect a call from radiology scheduling to set up her appt. Pt verbalized understanding and had no concerns at the end of the call.  GES order in epic. Secure staff message sent to radiology scheduling to contact patient to set up appt. Fecal elastase stool study order in epic.

## 2022-03-02 MED ORDER — DIPHENOXYLATE-ATROPINE 2.5-0.025 MG PO TABS: 1.0000 | ORAL_TABLET | Freq: Four times a day (QID) | ORAL | 0 refills | Status: DC | PRN

## 2022-03-02 NOTE — Telephone Encounter (Signed)
Noted, thanks!

## 2022-03-02 NOTE — Telephone Encounter (Signed)
Prescription sent.  - HD

## 2022-03-04 ENCOUNTER — Other Ambulatory Visit: Payer: Commercial Managed Care - HMO

## 2022-03-04 ENCOUNTER — Ambulatory Visit (INDEPENDENT_AMBULATORY_CARE_PROVIDER_SITE_OTHER): Payer: Commercial Managed Care - HMO | Admitting: Family Medicine

## 2022-03-04 ENCOUNTER — Encounter: Payer: Self-pay | Admitting: Family Medicine

## 2022-03-04 VITALS — BP 134/82 | HR 92 | Ht 63.0 in

## 2022-03-04 DIAGNOSIS — Z23 Encounter for immunization: Secondary | ICD-10-CM | POA: Diagnosis not present

## 2022-03-04 DIAGNOSIS — G621 Alcoholic polyneuropathy: Secondary | ICD-10-CM

## 2022-03-04 DIAGNOSIS — I480 Paroxysmal atrial fibrillation: Secondary | ICD-10-CM

## 2022-03-04 DIAGNOSIS — F419 Anxiety disorder, unspecified: Secondary | ICD-10-CM

## 2022-03-04 DIAGNOSIS — R197 Diarrhea, unspecified: Secondary | ICD-10-CM

## 2022-03-04 DIAGNOSIS — I4891 Unspecified atrial fibrillation: Secondary | ICD-10-CM | POA: Diagnosis not present

## 2022-03-04 DIAGNOSIS — R262 Difficulty in walking, not elsewhere classified: Secondary | ICD-10-CM

## 2022-03-04 DIAGNOSIS — R112 Nausea with vomiting, unspecified: Secondary | ICD-10-CM

## 2022-03-04 MED ORDER — APIXABAN 5 MG PO TABS: 5.0000 mg | ORAL_TABLET | Freq: Two times a day (BID) | ORAL | 3 refills | Status: DC

## 2022-03-04 MED ORDER — AMIODARONE HCL 200 MG PO TABS: 200.0000 mg | ORAL_TABLET | Freq: Every day | ORAL | 3 refills | Status: DC

## 2022-03-04 MED ORDER — HYDROXYZINE HCL 10 MG PO TABS: 10.0000 mg | ORAL_TABLET | Freq: Three times a day (TID) | ORAL | 3 refills | Status: DC | PRN

## 2022-03-04 MED ORDER — ONDANSETRON HCL 4 MG PO TABS: 4.0000 mg | ORAL_TABLET | Freq: Three times a day (TID) | ORAL | 3 refills | Status: DC | PRN

## 2022-03-04 NOTE — Assessment & Plan Note (Signed)
Continue working with PT.  She needs to do exercises they recommend

## 2022-03-04 NOTE — Progress Notes (Signed)
    SUBJECTIVE:   CHIEF COMPLAINT / HPI:   FU multiple issues Neuropathy.  Seen by [facility] to access records.  Non specific poly neuropathy.  She has FU appointment with them.  Gabapentin working, but has trouble swallowing the large pill. Dysphagia, nausea and diarrhea.  Patient seeing GI.  Has tests scheduled.   Generalized weakness.  Still has trouble getting around.  Significant other, Elenore Rota, waits on her hand and foot.  Seeing PT but down to once a week. Anxiety.  About the same.  Still mourning death of dog, Katy Apo.  Elenore Rota also taking it hard.  He is crying all the time.   Need to refill multiple medications.      OBJECTIVE:   BP 134/82   Pulse 92   Ht '5\' 3"'$  (1.6 m)   SpO2 98%   BMI 23.04 kg/m   Gen appearance improved.  Better color and affect. Lungs clear Cardiac RRR without m or g Refused to get out of wheelchair for weight.  ASSESSMENT/PLAN:   Peripheral neuropathy Treated reasonably well by gabapentin.  Trouble swallowing pills.  OK to crush.  (offered capsules which she could open up.)  Also discussed increasing dose.  She opted to stay the same for now.  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  WU by GI.  Anxiety She says about the same.  At this moment, she appears some better to me.  No change.  Paroxysmal atrial fibrillation (HCC) REfilled xarelto and amiodorone as requested.    Ambulatory dysfunction Continue working with PT.  She needs to do exercises they recommend     Zenia Resides, MD [place]

## 2022-03-04 NOTE — Assessment & Plan Note (Signed)
She says about the same.  At this moment, she appears some better to me.  No change.

## 2022-03-04 NOTE — Assessment & Plan Note (Signed)
Treated reasonably well by gabapentin.  Trouble swallowing pills.  OK to crush.  (offered capsules which she could open up.)  Also discussed increasing dose.  She opted to stay the same for now.

## 2022-03-04 NOTE — Assessment & Plan Note (Signed)
REfilled xarelto and amiodorone as requested.

## 2022-03-04 NOTE — Patient Instructions (Addendum)
OK to crush the gabapentin.  Please take regularly. I can still increase the dose of gabapentin if needed. I refilled four meds you asked. I am glad you are seeing the GI doctor.  I will be interested in the results. Keep working hard in physical therapy.  Do your exercises.  Don't let Karen Rota do too much. See me in one month.

## 2022-03-04 NOTE — Assessment & Plan Note (Signed)
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  WU by GI.

## 2022-03-09 ENCOUNTER — Other Ambulatory Visit: Payer: Commercial Managed Care - HMO

## 2022-03-09 ENCOUNTER — Telehealth: Payer: Self-pay

## 2022-03-09 DIAGNOSIS — K529 Noninfective gastroenteritis and colitis, unspecified: Secondary | ICD-10-CM

## 2022-03-09 NOTE — Telephone Encounter (Signed)
Patient calls nurse line in regards to Eliquis.   Patient reports she went to have the medication filled, however a one months supply is ~ 100 dollars. Patient reports she can not afford this.   Patient reports she has ~ 10 tablets left from hospital supply.   Will forward to pharmacy to see if assistance can be offered.

## 2022-03-09 NOTE — Telephone Encounter (Signed)
[facility] OT calls nurse line requesting VO for [facility] OT as follows.   1x a week for 4 weeks   Verbal order given.

## 2022-03-10 ENCOUNTER — Other Ambulatory Visit (HOSPITAL_COMMUNITY): Payer: Self-pay

## 2022-03-10 NOTE — Telephone Encounter (Signed)
Left message requesting call back regarding eliquis copay.  Pt may call [phone] to request an Eliquis copay card or apply for copay card online.

## 2022-03-12 NOTE — Telephone Encounter (Signed)
Patient calls nurse line again in regards to Eliquis.   Patient reports she called the number given to sign up for copay card, however had to "LVM" and she has not heard back from them.  Patient reports she has no more medication as of today.   I called the pharmacy and they are getting a 5 day supply ready for her for ~18.00.   Patient encouraged to continue trying the copay assistance number.   Patient appreciative.

## 2022-03-15 ENCOUNTER — Other Ambulatory Visit: Payer: Self-pay

## 2022-03-15 LAB — PANCREATIC ELASTASE, FECAL: Pancreatic Elastase-1, Stool: 481 mcg/g

## 2022-03-15 MED ORDER — QUETIAPINE FUMARATE 100 MG PO TABS: 100.0000 mg | ORAL_TABLET | Freq: Every day | ORAL | 1 refills | Status: DC

## 2022-03-15 NOTE — Telephone Encounter (Signed)
Patient returns call to nurse line. She states that she has not heard anything regarding medication assistance. She is requesting to change to an alternative medication.   Please advise.   Talbot Grumbling, RN

## 2022-03-15 NOTE — Telephone Encounter (Signed)
Called patient. Discussed I am covering for Dr Andria Frames, she has 4 days of Eliquis left. She has called the copay card and not heard anything back. I discussed I will see if we have samples at clinic and Dr Andria Frames will be back tomorrow. Discussed options if she cannot afford the Eliquis would include warfarin which is difficult at the start bc regular INR checks and dosing would need to be adjusted. She was appreciative of the call.  Yehuda Savannah MD

## 2022-03-16 ENCOUNTER — Other Ambulatory Visit (HOSPITAL_COMMUNITY): Payer: Self-pay

## 2022-03-16 NOTE — Telephone Encounter (Signed)
I called patient.  She had not gotten the number to try for the Eliquis co-pay card.  She took the number and said she would call - then added that it may not help if the co-pay was too high.  I will also route call to Dr. Valentina Lucks to see if we have samples that we could provide her while we sort this out.

## 2022-03-17 ENCOUNTER — Encounter (HOSPITAL_COMMUNITY)
Admission: RE | Admit: 2022-03-17 | Discharge: 2022-03-17 | Disposition: A | Discharge status: Home / Self Care | Payer: Commercial Managed Care - HMO | Source: Ambulatory Visit | Attending: Gastroenterology | Admitting: Gastroenterology

## 2022-03-17 DIAGNOSIS — R1084 Generalized abdominal pain: Secondary | ICD-10-CM | POA: Diagnosis present

## 2022-03-17 DIAGNOSIS — R112 Nausea with vomiting, unspecified: Secondary | ICD-10-CM | POA: Diagnosis present

## 2022-03-17 MED ORDER — TECHNETIUM TC 99M SULFUR COLLOID
2.0000 | Freq: Once | INTRAVENOUS | Status: AC | PRN
  Administered 2022-03-17: 2 via ORAL

## 2022-03-17 NOTE — Telephone Encounter (Signed)
Left message attempting to f/u about eliquis copay card.

## 2022-03-17 NOTE — Telephone Encounter (Signed)
Patient returns call to nurse line. Patient states that she called the phone number, however, they told her that she did not qualify for copay card.   Patient is very upset and is asking if she can be switched to an alternative medication. Advised patient of message from Dr. Thompson Grayer. Patient has follow up visit with Dr. Andria Frames on 04/01/22.Told patient that I would check in with Dr. Andria Frames and pharmacy team about concerns. Patient disconnected call while I was trying to provide additional instructions.   Spoke with Dr. Andria Frames regarding patient. He is asking if samples can be provided to patient to get her through until this appointment.   Spoke with Dr. Valentina Lucks. Received permission to give patient samples of Eliquis. 3 boxes assigned to patient in med room.   Called patient to provide update. She was very upset that we have not changed medication. Tried to advise patient of follow up plan. Informed patient that our office was closed this afternoon and that she could pick it up as early as tomorrow. She states that she will not be able to pick up medication tomorrow. Patient hung up phone, was unable to determine if she plans to pick up medication.   *During conversation, patient exhibited inappropriate behaviors toward staff including, mocking and calling staff stupid.

## 2022-03-18 NOTE — Telephone Encounter (Signed)
Sister presents to [facility] to pick up samples.   Samples given.

## 2022-03-24 ENCOUNTER — Other Ambulatory Visit (HOSPITAL_COMMUNITY): Payer: Self-pay

## 2022-03-25 NOTE — Telephone Encounter (Signed)
I called to discuss behavior on multiple occasions.  She did not pick up.  I will address at our next visit.

## 2022-03-26 ENCOUNTER — Ambulatory Visit: Payer: Commercial Managed Care - HMO | Admitting: Gastroenterology

## 2022-03-26 ENCOUNTER — Telehealth: Payer: Self-pay

## 2022-03-26 NOTE — Telephone Encounter (Signed)
Patient calls nurse line requesting Eliquis samples.   Patient reports she was called this morning and notified H[facility]el will not be in the office next week as previously scheduled for 10/19.  Patient reports she will run out of Eliquis samples provided to her. She has a rescheduled apt with [facility] on 11/3.  Patient reports she has two boxes left as of today.   Will forward to pharmacy clinic for an additional box.

## 2022-03-29 NOTE — Telephone Encounter (Signed)
Noted and agree. 

## 2022-03-29 NOTE — Telephone Encounter (Signed)
Contacted patient to verify current dosing and supply of her Apixaban (Eliquis).   She verified she is taking 1 tablet twice daily.  She is in need of supply to make it to her visit with Dr. Thompson Grayer - currently scheduled on 11/3.   Medication Samples have been provided for the patient to pick-up 10/17  Drug name: eliquis (apixaban)       Strength: '5mg'$         Qty: 28 tablets  LOT: [id number]  Exp.Date: 10/12/2023  Dosing instructions: 1 BID  The patient has been instructed regarding the correct time, dose, and frequency of taking this medication, including desired effects and most common side effects.   Karen Casey 2:32 PM 03/29/2022  Patient thanked me for support.    I am unsure of long-term anticoagulation plan due to cost and ability to pay.  I will forward to Hortencia Pilar, CPhT for potential assistance.

## 2022-03-31 ENCOUNTER — Other Ambulatory Visit: Payer: Self-pay | Admitting: Family Medicine

## 2022-03-31 DIAGNOSIS — F419 Anxiety disorder, unspecified: Secondary | ICD-10-CM

## 2022-04-01 ENCOUNTER — Ambulatory Visit: Payer: Commercial Managed Care - HMO | Admitting: Family Medicine

## 2022-04-04 ENCOUNTER — Other Ambulatory Visit: Payer: Self-pay | Admitting: Family Medicine

## 2022-04-04 DIAGNOSIS — G6289 Other specified polyneuropathies: Secondary | ICD-10-CM

## 2022-04-06 NOTE — Progress Notes (Incomplete)
    SUBJECTIVE:   CHIEF COMPLAINT / HPI:   PCP goals of care-   PERTINENT  PMH / PSH: ***  OBJECTIVE:   There were no vitals taken for this visit.  ***  ASSESSMENT/PLAN:   No problem-specific Assessment & Plan notes found for this encounter.     [name], MD [place]

## 2022-04-08 ENCOUNTER — Other Ambulatory Visit: Payer: Self-pay | Admitting: Family Medicine

## 2022-04-12 NOTE — Telephone Encounter (Signed)
Patient's sister presents to clinic for eliquis samples.   Provided per Dr. Valentina Lucks.   Talbot Grumbling, RN

## 2022-04-16 ENCOUNTER — Ambulatory Visit: Payer: Commercial Managed Care - HMO | Admitting: Family Medicine

## 2022-04-28 ENCOUNTER — Ambulatory Visit: Payer: Commercial Managed Care - HMO | Admitting: Gastroenterology

## 2022-04-28 NOTE — Progress Notes (Deleted)
    SUBJECTIVE:   CHIEF COMPLAINT / HPI:   Paroxysmal Afib- on Xarelto and amiodarone, trouble paying for Elliquis.   PERTINENT  PMH / PSH: ***  OBJECTIVE:   There were no vitals taken for this visit.  General: A&O, NAD HEENT: No sign of trauma, EOM grossly intact Cardiac: RRR, no m/r/g Respiratory: CTAB, normal WOB, no w/c/r GI: Soft, NTTP, non-distended  Extremities: NTTP, no peripheral edema. Neuro: Normal gait, moves all four extremities appropriately. Psych: Appropriate mood and affect   ASSESSMENT/PLAN:   No problem-specific Assessment & Plan notes found for this encounter.     [name], MD [place]

## 2022-04-29 ENCOUNTER — Ambulatory Visit: Payer: Commercial Managed Care - HMO | Admitting: Family Medicine

## 2022-05-13 ENCOUNTER — Telehealth: Payer: Self-pay

## 2022-05-13 NOTE — Telephone Encounter (Signed)
Amy from [place] calling for verbal orders as follows:  Requesting orders for home health nursing, PT, OT evaluations.   Verbal orders given per [facility] protocol  Talbot Grumbling, RN

## 2022-05-14 ENCOUNTER — Telehealth: Payer: Self-pay

## 2022-05-14 NOTE — Telephone Encounter (Signed)
Patient calls nurse line requesting a call from [place] or [place].   She reports her husband is in the hospital. She was tearful on the phone. She reports he had a heart attack. It appears she is seeking comfort and possible answers.  Will forward to PCP.

## 2022-05-17 NOTE — Telephone Encounter (Signed)
Called patient and left VM x2. She called back earlier when I was on the phone with another patient. Left VM that I would try to call again tomorrow.

## 2022-05-17 NOTE — Telephone Encounter (Signed)
Called and left VM with clinic number to call back. Asked Ms Sandridge to leave times she is available to talk. Will call again later.  Yehuda Savannah MD

## 2022-05-17 NOTE — Telephone Encounter (Signed)
Called patient and left VM. Let her know I was thinking of her.  Yehuda Savannah MD

## 2022-05-19 NOTE — Telephone Encounter (Signed)
Patient LVM on nurse line requesting to speak with PCP.   She reports she lost her husband.   Will forward to PCP.

## 2022-05-28 NOTE — Telephone Encounter (Signed)
Patient calls nurse line again to speak with PCP.   She reports "we have been playing phone tag for two weeks."   She is also requesting a refill on Seroquel. She reports she has been been taking #2 at bedtime to go to sleep.   Will forward to PCP.

## 2022-05-31 ENCOUNTER — Telehealth: Payer: Self-pay | Admitting: Family Medicine

## 2022-05-31 ENCOUNTER — Other Ambulatory Visit: Payer: Self-pay | Admitting: Family Medicine

## 2022-05-31 DIAGNOSIS — F32A Depression, unspecified: Secondary | ICD-10-CM

## 2022-05-31 MED ORDER — QUETIAPINE FUMARATE 100 MG PO TABS: 100.0000 mg | ORAL_TABLET | Freq: Every day | ORAL | 1 refills | Status: DC

## 2022-05-31 NOTE — Telephone Encounter (Signed)
Called patient. Lost her dog, and her husband died recently of a heart attack. Provided supportive listening and condolences. Refilled seroquel. Discussed coming in for an appt and bringing all medications.  Appt scheduled with Dr Andria Frames for 12/29, and appt scheduled with myself for 06/25/21.  Yehuda Savannah MD

## 2022-05-31 NOTE — Telephone Encounter (Signed)
Patient calls nurse line again requesting to speak with PCP.

## 2022-05-31 NOTE — Telephone Encounter (Signed)
Called patient, left VM asking her to leave times that she is available for me to call back. Let her know I am thinking of her.

## 2022-06-02 ENCOUNTER — Ambulatory Visit: Payer: Self-pay | Admitting: Family Medicine

## 2022-06-03 ENCOUNTER — Other Ambulatory Visit: Payer: Self-pay | Admitting: Family Medicine

## 2022-06-03 DIAGNOSIS — F419 Anxiety disorder, unspecified: Secondary | ICD-10-CM

## 2022-06-03 DIAGNOSIS — R1310 Dysphagia, unspecified: Secondary | ICD-10-CM

## 2022-06-04 MED ORDER — GABAPENTIN 600 MG PO TABS: 600.0000 mg | ORAL_TABLET | Freq: Three times a day (TID) | ORAL | 1 refills | Status: DC

## 2022-06-04 NOTE — Addendum Note (Signed)
Addended by: Talbot Grumbling on: 06/04/2022 09:53 AM   Modules accepted: Orders

## 2022-06-04 NOTE — Telephone Encounter (Signed)
Initial rx- failed to transmit to pharmacy.   Resent electronically. Receipt confirmed by pharmacy 06/04/22 at 0952.  Talbot Grumbling, RN

## 2022-06-11 ENCOUNTER — Ambulatory Visit (INDEPENDENT_AMBULATORY_CARE_PROVIDER_SITE_OTHER): Payer: Self-pay | Admitting: Family Medicine

## 2022-06-11 ENCOUNTER — Encounter: Payer: Self-pay | Admitting: Family Medicine

## 2022-06-11 VITALS — BP 123/72 | HR 99 | Temp 98.2°F | Ht 63.0 in | Wt 134.8 lb

## 2022-06-11 DIAGNOSIS — R197 Diarrhea, unspecified: Secondary | ICD-10-CM

## 2022-06-11 DIAGNOSIS — G621 Alcoholic polyneuropathy: Secondary | ICD-10-CM

## 2022-06-11 DIAGNOSIS — I10 Essential (primary) hypertension: Secondary | ICD-10-CM

## 2022-06-11 DIAGNOSIS — R112 Nausea with vomiting, unspecified: Secondary | ICD-10-CM

## 2022-06-11 DIAGNOSIS — F32A Depression, unspecified: Secondary | ICD-10-CM

## 2022-06-11 DIAGNOSIS — F419 Anxiety disorder, unspecified: Secondary | ICD-10-CM

## 2022-06-11 DIAGNOSIS — R7989 Other specified abnormal findings of blood chemistry: Secondary | ICD-10-CM

## 2022-06-11 MED ORDER — QUETIAPINE FUMARATE 200 MG PO TABS: 200.0000 mg | ORAL_TABLET | Freq: Every day | ORAL | 12 refills | Status: DC

## 2022-06-11 MED ORDER — GABAPENTIN 800 MG PO TABS: 800.0000 mg | ORAL_TABLET | Freq: Three times a day (TID) | ORAL | 12 refills | Status: DC

## 2022-06-11 NOTE — Assessment & Plan Note (Signed)
Surprisingly improved.

## 2022-06-11 NOTE — Assessment & Plan Note (Signed)
Stable.  Trial of increased gabapentin.

## 2022-06-11 NOTE — Assessment & Plan Note (Signed)
Improved along with improved nutritional status.  Advised that she take a daily multivit given her fairly restrictive diet.

## 2022-06-11 NOTE — Assessment & Plan Note (Signed)
Recheck labs now that better nutrition.

## 2022-06-11 NOTE — Assessment & Plan Note (Signed)
Normotensive off all antihypertensive meds.

## 2022-06-11 NOTE — Patient Instructions (Signed)
I increased your seroquel and your gabapentin.   I also cleaned up your med list. I am sorry about Elenore Rota and Katy Apo.  I love the [employer] urn.   Start taking the Centrum vitamin every day.   Let's hope for a better 2024. I retire Feb 15 is my last day.

## 2022-06-11 NOTE — Assessment & Plan Note (Signed)
Given recent losses, I think she is surprisingly improved.  I did increase her seroquel.

## 2022-06-11 NOTE — Progress Notes (Signed)
    SUBJECTIVE:   CHIEF COMPLAINT / HPI:   Depression/anxiety.  Difficult last few months.  First her dog, Katy Apo, died.  Then her husband, Elenore Rota, died.  She states that she will need to sell her house and move.  She seems to be coping.  Does ask for an increase in her nighttime seroquel.  Peripheral neuropathy still bothersome.  Would like to try higher dose of gabpentin.  Her nausea, vomiting and diarrhea have improved as has her weight.  She has found a baked potatoe diet that works for her.    Brought in meds.  I cleaned up her med list.  Was not taking many of her prescribed meds.    OBJECTIVE:   BP 123/72   Pulse 99   Temp 98.2 F (36.8 C) (Oral)   Ht '5\' 3"'$  (1.6 m)   Wt 134 lb 12.8 oz (61.1 kg)   SpO2 99%   BMI 23.88 kg/m   She had both smiles and tears during our visit. Lungs clear Cardiac RRR without m or g  ASSESSMENT/PLAN:   Nausea vomiting and diarrhea Improved along with improved nutritional status.  Advised that she take a daily multivit given her fairly restrictive diet.    Elevated LFTs Recheck labs now that better nutrition.  Anxiety Given recent losses, I think she is surprisingly improved.  I did increase her seroquel.  HTN (hypertension) Normotensive off all antihypertensive meds.    Peripheral neuropathy Stable.  Trial of increased gabapentin.  Depression Surprisingly improved.     Zenia Resides, MD [place]

## 2022-06-12 LAB — CMP14+EGFR
ALT: 9 IU/L (ref 0–32)
AST: 19 IU/L (ref 0–40)
Albumin/Globulin Ratio: 2 (ref 1.2–2.2)
Albumin: 4.5 g/dL (ref 3.9–4.9)
Alkaline Phosphatase: 99 IU/L (ref 44–121)
BUN/Creatinine Ratio: 14 (ref 12–28)
BUN: 10 mg/dL (ref 8–27)
Bilirubin Total: 0.2 mg/dL (ref 0.0–1.2)
CO2: 24 mmol/L (ref 20–29)
Calcium: 9.7 mg/dL (ref 8.7–10.3)
Chloride: 96 mmol/L (ref 96–106)
Creatinine, Ser: 0.74 mg/dL (ref 0.57–1.00)
Globulin, Total: 2.2 g/dL (ref 1.5–4.5)
Glucose: 92 mg/dL (ref 70–99)
Potassium: 5.4 mmol/L — ABNORMAL HIGH (ref 3.5–5.2)
Sodium: 136 mmol/L (ref 134–144)
Total Protein: 6.7 g/dL (ref 6.0–8.5)
eGFR: 90 mL/min/{1.73_m2} (ref >59)

## 2022-06-12 LAB — TSH: TSH: 4.51 u[IU]/mL — ABNORMAL HIGH (ref 0.450–4.500)

## 2022-06-24 ENCOUNTER — Emergency Department (HOSPITAL_COMMUNITY)
Admission: EM | Admit: 2022-06-24 | Discharge: 2022-06-25 | Disposition: A | Discharge status: Home / Self Care | Payer: Self-pay | Attending: Emergency Medicine | Admitting: Emergency Medicine

## 2022-06-24 ENCOUNTER — Other Ambulatory Visit: Payer: Self-pay

## 2022-06-24 ENCOUNTER — Emergency Department (HOSPITAL_COMMUNITY): Payer: Self-pay

## 2022-06-24 ENCOUNTER — Encounter (HOSPITAL_COMMUNITY): Payer: Self-pay

## 2022-06-24 DIAGNOSIS — W06XXXA Fall from bed, initial encounter: Secondary | ICD-10-CM | POA: Insufficient documentation

## 2022-06-24 DIAGNOSIS — Z7901 Long term (current) use of anticoagulants: Secondary | ICD-10-CM | POA: Insufficient documentation

## 2022-06-24 DIAGNOSIS — D649 Anemia, unspecified: Secondary | ICD-10-CM | POA: Insufficient documentation

## 2022-06-24 DIAGNOSIS — Y901 Blood alcohol level of 20-39 mg/100 ml: Secondary | ICD-10-CM | POA: Insufficient documentation

## 2022-06-24 DIAGNOSIS — M25561 Pain in right knee: Secondary | ICD-10-CM | POA: Insufficient documentation

## 2022-06-24 DIAGNOSIS — W19XXXA Unspecified fall, initial encounter: Secondary | ICD-10-CM

## 2022-06-24 LAB — CBC WITH DIFFERENTIAL/PLATELET
Abs Immature Granulocytes: 0.01 10*3/uL (ref 0.00–0.07)
Basophils Absolute: 0 10*3/uL (ref 0.0–0.1)
Basophils Relative: 0 %
Eosinophils Absolute: 0.1 10*3/uL (ref 0.0–0.5)
Eosinophils Relative: 2 %
HCT: 25.6 % — ABNORMAL LOW (ref 36.0–46.0)
Hemoglobin: 7.5 g/dL — ABNORMAL LOW (ref 12.0–15.0)
Immature Granulocytes: 0 %
Lymphocytes Relative: 23 %
Lymphs Abs: 1.3 10*3/uL (ref 0.7–4.0)
MCH: 20.7 pg — ABNORMAL LOW (ref 26.0–34.0)
MCHC: 29.3 g/dL — ABNORMAL LOW (ref 30.0–36.0)
MCV: 70.5 fL — ABNORMAL LOW (ref 80.0–100.0)
Monocytes Absolute: 0.4 10*3/uL (ref 0.1–1.0)
Monocytes Relative: 7 %
Neutro Abs: 3.6 10*3/uL (ref 1.7–7.7)
Neutrophils Relative %: 68 %
Platelets: 207 10*3/uL (ref 150–400)
RBC: 3.63 MIL/uL — ABNORMAL LOW (ref 3.87–5.11)
RDW: 20.8 % — ABNORMAL HIGH (ref 11.5–15.5)
WBC: 5.4 10*3/uL (ref 4.0–10.5)
nRBC: 0 % (ref 0.0–0.2)

## 2022-06-24 LAB — COMPREHENSIVE METABOLIC PANEL
ALT: 19 U/L (ref 0–44)
AST: 28 U/L (ref 15–41)
Albumin: 3.6 g/dL (ref 3.5–5.0)
Alkaline Phosphatase: 86 U/L (ref 38–126)
Anion gap: 7 (ref 5–15)
BUN: 10 mg/dL (ref 8–23)
CO2: 22 mmol/L (ref 22–32)
Calcium: 8.5 mg/dL — ABNORMAL LOW (ref 8.9–10.3)
Chloride: 102 mmol/L (ref 98–111)
Creatinine, Ser: 0.74 mg/dL (ref 0.44–1.00)
GFR, Estimated: >60 mL/min (ref >60)
Glucose, Bld: 94 mg/dL (ref 70–99)
Potassium: 3.7 mmol/L (ref 3.5–5.1)
Sodium: 131 mmol/L — ABNORMAL LOW (ref 135–145)
Total Bilirubin: 0.1 mg/dL — ABNORMAL LOW (ref 0.3–1.2)
Total Protein: 6.4 g/dL — ABNORMAL LOW (ref 6.5–8.1)

## 2022-06-24 LAB — ETHANOL: Alcohol, Ethyl (B): 39 mg/dL — ABNORMAL HIGH (ref <10)

## 2022-06-24 MED ORDER — ACETAMINOPHEN 500 MG PO TABS
1000.0000 mg | ORAL_TABLET | Freq: Once | ORAL | Status: AC
  Administered 2022-06-24: 1000 mg via ORAL
  Filled 2022-06-24: qty 2

## 2022-06-24 NOTE — ED Notes (Signed)
Pt is requesting pain meds. Dr. Vanita Panda informed via epic secure chat.

## 2022-06-24 NOTE — ED Triage Notes (Signed)
PER EMS: pt is from home with c/o falling tonight after a near syncopal episode when getting out of her bed. She did not hit her head, no LOC, no blood thinner. BP standing was 70 palpated with EMS, HR-120. Laying down BP was 120/60.HR-80. She denies pain. A&OX4.  Pt has been drinking a lot of beer lately due to the recent death of her husband last month, per EMS. Denies SI. Pt lives at home alone.

## 2022-06-24 NOTE — Discharge Instructions (Signed)
As discussed, today's evaluation did not demonstrate evidence for a fracture, and your labs were generally reassuring.  However, your labs did show that you have some evidence for anemia.  This may be related to your ongoing alcohol use.  Please be sure to follow-up with your physician as soon as possible for appropriate ongoing outpatient care.  For pain control of your injuries following a fall please use Tylenol, ice packs.  Return here for concerning changes in your condition.

## 2022-06-24 NOTE — ED Provider Notes (Signed)
[facility] DEPT Provider Note   CSN: [id number] Arrival date & time: 06/24/22  2122     History  Chief Complaint  Patient presents with   Karen Casey    Karen Casey is a 67 y.o. female.  HPI Patient presents after a fall with pain in her right knee primarily.  Notes that she was getting out of bed, lost balance.  Per EMS the patient was hypotensive, tachycardic, but improved while laying down.  Patient notes that she has been drinking alcohol, due to the death of her husband 2 months ago, and the death of her dog 2 months prior to that.  No suicidal ideation.  Since the fall she has had pain primarily in her right knee, no chest pain, no dyspnea    Home Medications Prior to Admission medications   Medication Sig Start Date End Date Taking? Authorizing Provider  albuterol (VENTOLIN HFA) 108 (90 Base) MCG/ACT inhaler Inhale 2 puffs into the lungs every 6 (six) hours as needed for wheezing or shortness of breath. 09/23/21   [name], MD  amiodarone (PACERONE) 200 MG tablet Take 1 tablet (200 mg total) by mouth daily. 03/04/22   Zenia Resides, MD  apixaban (ELIQUIS) 5 MG TABS tablet Take 1 tablet (5 mg total) by mouth 2 (two) times daily. Patient not taking: Reported on 06/11/2022 03/04/22   Zenia Resides, MD  busPIRone (BUSPAR) 10 MG tablet Take 10 mg by mouth 3 (three) times daily as needed (anxiety).    [provider]  CALCIUM PO Take 1 tablet by mouth daily.    [provider]  Cholecalciferol (VITAMIN D3 PO) Take 1 capsule by mouth daily.    [provider]  Fluticasone-Umeclidin-Vilant (TRELEGY ELLIPTA) 100-62.5-25 MCG/ACT AEPB Inhale 2 puffs into the lungs daily. Patient not taking: Reported on 06/11/2022    [provider]  gabapentin (NEURONTIN) 800 MG tablet Take 1 tablet (800 mg total) by mouth 3 (three) times daily. 06/11/22   Zenia Resides, MD  hydrOXYzine (ATARAX) 10 MG tablet TAKE 1 TABLET  BY MOUTH 3 TIMES DAILY AS NEEDED FOR ANXIETY. 04/02/22   [name], MD  Multiple Vitamins-Minerals (CENTRUM SILVER 50+WOMEN PO) Take 1 tablet by mouth daily.    [provider]  QUEtiapine (SEROQUEL) 200 MG tablet Take 1 tablet (200 mg total) by mouth at bedtime. 06/11/22   Zenia Resides, MD      Allergies    Capsaicin-menthol and Diclo gel [diclofenac sodium]    Review of Systems   Review of Systems  All other systems reviewed and are negative.   Physical Exam Updated Vital Signs BP 114/67   Pulse 87   Temp 97.8 F (36.6 C) (Oral)   Resp 17   Ht '5\' 5"'$  (1.651 m)   Wt 61.2 kg   SpO2 97%   BMI 22.47 kg/m  Physical Exam Vitals and nursing note reviewed.  Constitutional:      General: She is not in acute distress.    Appearance: She is well-developed.  HENT:     Head: Normocephalic and atraumatic.  Eyes:     Conjunctiva/sclera: Conjunctivae normal.  Cardiovascular:     Rate and Rhythm: Normal rate and regular rhythm.  Pulmonary:     Effort: Pulmonary effort is normal. No respiratory distress.     Breath sounds: Normal breath sounds. No stridor.  Abdominal:     General: There is no distension.  Musculoskeletal:  Comments: Elvis stable, no obvious deformities of the right or left lower extremity.  She flexes each hip independently, to command, knees independently and to command.  Mild tenderness to palpation in the right knee medial, no instability.  Skin:    General: Skin is warm and dry.  Neurological:     Mental Status: She is alert and oriented to person, place, and time.     Cranial Nerves: No cranial nerve deficit.     Comments: Patient moves all extremity spontaneously, and to command, face is symmetric, speech is clear  Psychiatric:        Behavior: Behavior is withdrawn.        Thought Content: Thought content does not include suicidal ideation.     ED Results / Procedures / Treatments   Labs (all labs ordered are listed, but only  abnormal results are displayed) Labs Reviewed  COMPREHENSIVE METABOLIC PANEL - Abnormal; Notable for the following components:      Result Value   Sodium 131 (*)    Calcium 8.5 (*)    Total Protein 6.4 (*)    Total Bilirubin 0.1 (*)    All other components within normal limits  ETHANOL - Abnormal; Notable for the following components:   Alcohol, Ethyl (B) 39 (*)    All other components within normal limits  CBC WITH DIFFERENTIAL/PLATELET - Abnormal; Notable for the following components:   RBC 3.63 (*)    Hemoglobin 7.5 (*)    HCT 25.6 (*)    MCV 70.5 (*)    MCH 20.7 (*)    MCHC 29.3 (*)    RDW 20.8 (*)    All other components within normal limits    EKG EKG Interpretation  Date/Time:  Thursday June 24 2022 21:51:47 EST Ventricular Rate:  87 PR Interval:  167 QRS Duration: 81 QT Interval:  398 QTC Calculation: 479 R Axis:   24 Text Interpretation: Sinus rhythm Consider left atrial enlargement Artifact Abnormal ECG Confirmed by Carmin Muskrat [phone]) on 06/24/2022 11:37:25 PM  Radiology DG Chest 2 View  Result Date: 06/24/2022 CLINICAL DATA:  Fall injury at home. EXAM: CHEST - 2 VIEW COMPARISON:  Portable chest 02/08/2022 FINDINGS: The heart size and mediastinal contours are within normal limits. There is calcific plaque in the aortic arch. Both lungs are mildly hyperexpanded but clear. No pneumothorax is seen. Again noted is osteopenia, thoracic kyphosis, and a chronic severe compression fracture of T6 with vertebra plana and retropulsion, moderate chronic compression fracture of T5 both unchanged compared to a CTA chest of 11/22/2021. IMPRESSION: 1. No radiographic evidence of acute chest injury or infiltrate. 2. 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 3. Osteopenia. 4. Aortic atherosclerosis. Electronically Signed   By: Telford Nab M.D.   On: 06/24/2022 22:49   DG Hip Unilat With Pelvis 2-3 Views Right  Result Date:  06/24/2022 CLINICAL DATA:  Fall EXAM: DG HIP (WITH OR WITHOUT PELVIS) 2-3V RIGHT COMPARISON:  None Available. FINDINGS: No acute fracture or dislocation. Degenerative changes pubic symphysis, both hips, SI joints and lower lumbar spine. IMPRESSION: No acute fracture or dislocation. Electronically Signed   By: Placido Sou M.D.   On: 06/24/2022 22:45   DG Knee Complete 4 Views Right  Result Date: 06/24/2022 CLINICAL DATA:  Fall EXAM: RIGHT KNEE - COMPLETE 4+ VIEW COMPARISON:  None Available. FINDINGS: No acute fracture or dislocation. Mild osteoarthritis. Small osseous fragment about the medial tibial eminence favored chronic. Small knee joint effusion.  IMPRESSION: No acute fracture or dislocation. Electronically Signed   By: Placido Sou M.D.   On: 06/24/2022 22:44    Procedures Procedures    Medications Ordered in ED Medications  acetaminophen (TYLENOL) tablet 1,000 mg (1,000 mg Oral Given 06/24/22 2328)    ED Course/ Medical Decision Making/ A&P                           Medical Decision Making With history of complicated grief reaction, prior use of anticoagulation which she is not currently taking according to her presents after episode of fall.  This may have occurred after presyncope, the patient was noted to be tachycardic, hypotensive at that time, but returned to normal vital signs prior to ED arrival, remained so throughout her ED course.  No evidence for bacteremia, sepsis some suspicion for anemia secondary to her ongoing alcohol use, and possible dehydration.  Patient received fluid resuscitation, both with EMS and here, had evaluation as below with findings most notable for anemia, otherwise generally reassuring labs.  Radiographic studies without evidence for fracture.  After hours of monitoring here with no decompensation, no other alarming findings, the patient is appropriate for discharge with close outpatient follow-up after we discussed the importance of following up to  consider her anemia and alcohol use and grief.  Cardiac monitor 85 sinus normal Pulse ox 100% room air normal   Amount and/or Complexity of Data Reviewed Independent Historian: EMS External Data Reviewed: notes.    Details: Ongoing efforts with physician regarding recent grief Labs: ordered. Decision-making details documented in ED Course. Radiology: ordered. Decision-making details documented in ED Course.  Risk OTC drugs.   11:37 PM Patient in no distress, hemodynamically unremarkable, no ongoing complaints        Final Clinical Impression(s) / ED Diagnoses Final diagnoses:  Fall, initial encounter  Acute pain of right knee  Anemia, unspecified type    Rx / DC Orders ED Discharge Orders     None         Carmin Muskrat, MD 06/24/22 2338

## 2022-06-25 ENCOUNTER — Ambulatory Visit: Payer: Self-pay | Admitting: Family Medicine

## 2022-06-25 NOTE — ED Notes (Signed)
Pts sister arrived to transport her home. Pt verbalized understanding of d/c instructions and follow up care.

## 2022-07-04 ENCOUNTER — Other Ambulatory Visit: Payer: Self-pay | Admitting: Family Medicine

## 2022-07-04 ENCOUNTER — Other Ambulatory Visit: Payer: Self-pay | Admitting: Podiatry

## 2022-07-04 DIAGNOSIS — F32A Depression, unspecified: Secondary | ICD-10-CM

## 2022-07-04 DIAGNOSIS — K219 Gastro-esophageal reflux disease without esophagitis: Secondary | ICD-10-CM

## 2022-07-04 DIAGNOSIS — F419 Anxiety disorder, unspecified: Secondary | ICD-10-CM

## 2022-07-05 ENCOUNTER — Other Ambulatory Visit: Payer: Self-pay | Admitting: Family Medicine

## 2022-07-05 DIAGNOSIS — F32A Depression, unspecified: Secondary | ICD-10-CM

## 2022-07-16 ENCOUNTER — Ambulatory Visit: Payer: Self-pay | Admitting: Family Medicine

## 2022-07-23 ENCOUNTER — Other Ambulatory Visit: Payer: Self-pay | Admitting: Gastroenterology

## 2022-07-23 ENCOUNTER — Other Ambulatory Visit: Payer: Self-pay | Admitting: Family Medicine

## 2022-07-23 DIAGNOSIS — K219 Gastro-esophageal reflux disease without esophagitis: Secondary | ICD-10-CM

## 2022-07-26 ENCOUNTER — Inpatient Hospital Stay (HOSPITAL_COMMUNITY)
Admission: EM | Admit: 2022-07-26 | Discharge: 2022-07-30 | DRG: 643 | Disposition: A | Discharge status: Home / Self Care | Payer: Medicare Other | Attending: Family Medicine | Admitting: Family Medicine

## 2022-07-26 ENCOUNTER — Emergency Department (HOSPITAL_COMMUNITY): Payer: Medicare Other

## 2022-07-26 ENCOUNTER — Telehealth: Payer: Self-pay | Admitting: Family Medicine

## 2022-07-26 ENCOUNTER — Encounter (HOSPITAL_COMMUNITY): Payer: Self-pay

## 2022-07-26 ENCOUNTER — Other Ambulatory Visit: Payer: Self-pay

## 2022-07-26 DIAGNOSIS — Z720 Tobacco use: Secondary | ICD-10-CM | POA: Diagnosis present

## 2022-07-26 DIAGNOSIS — G629 Polyneuropathy, unspecified: Secondary | ICD-10-CM | POA: Diagnosis present

## 2022-07-26 DIAGNOSIS — F4320 Adjustment disorder, unspecified: Secondary | ICD-10-CM

## 2022-07-26 DIAGNOSIS — Z83438 Family history of other disorder of lipoprotein metabolism and other lipidemia: Secondary | ICD-10-CM

## 2022-07-26 DIAGNOSIS — Z818 Family history of other mental and behavioral disorders: Secondary | ICD-10-CM

## 2022-07-26 DIAGNOSIS — Z789 Other specified health status: Secondary | ICD-10-CM | POA: Diagnosis present

## 2022-07-26 DIAGNOSIS — I10 Essential (primary) hypertension: Secondary | ICD-10-CM | POA: Diagnosis present

## 2022-07-26 DIAGNOSIS — Z8249 Family history of ischemic heart disease and other diseases of the circulatory system: Secondary | ICD-10-CM

## 2022-07-26 DIAGNOSIS — K219 Gastro-esophageal reflux disease without esophagitis: Secondary | ICD-10-CM | POA: Diagnosis present

## 2022-07-26 DIAGNOSIS — Z1152 Encounter for screening for COVID-19: Secondary | ICD-10-CM

## 2022-07-26 DIAGNOSIS — E222 Syndrome of inappropriate secretion of antidiuretic hormone: Principal | ICD-10-CM | POA: Diagnosis present

## 2022-07-26 DIAGNOSIS — E871 Hypo-osmolality and hyponatremia: Secondary | ICD-10-CM | POA: Diagnosis present

## 2022-07-26 DIAGNOSIS — F1090 Alcohol use, unspecified, uncomplicated: Secondary | ICD-10-CM | POA: Diagnosis present

## 2022-07-26 DIAGNOSIS — Y900 Blood alcohol level of less than 20 mg/100 ml: Secondary | ICD-10-CM | POA: Diagnosis present

## 2022-07-26 DIAGNOSIS — F17210 Nicotine dependence, cigarettes, uncomplicated: Secondary | ICD-10-CM | POA: Diagnosis present

## 2022-07-26 DIAGNOSIS — Z79899 Other long term (current) drug therapy: Secondary | ICD-10-CM

## 2022-07-26 DIAGNOSIS — Z888 Allergy status to other drugs, medicaments and biological substances status: Secondary | ICD-10-CM

## 2022-07-26 DIAGNOSIS — J449 Chronic obstructive pulmonary disease, unspecified: Secondary | ICD-10-CM | POA: Diagnosis present

## 2022-07-26 DIAGNOSIS — F1091 Alcohol use, unspecified, in remission: Secondary | ICD-10-CM | POA: Diagnosis present

## 2022-07-26 DIAGNOSIS — J441 Chronic obstructive pulmonary disease with (acute) exacerbation: Secondary | ICD-10-CM | POA: Diagnosis present

## 2022-07-26 DIAGNOSIS — F332 Major depressive disorder, recurrent severe without psychotic features: Secondary | ICD-10-CM | POA: Diagnosis present

## 2022-07-26 DIAGNOSIS — D649 Anemia, unspecified: Secondary | ICD-10-CM | POA: Diagnosis present

## 2022-07-26 DIAGNOSIS — I700 Atherosclerosis of aorta: Secondary | ICD-10-CM | POA: Diagnosis present

## 2022-07-26 DIAGNOSIS — I219 Acute myocardial infarction, unspecified: Secondary | ICD-10-CM | POA: Diagnosis present

## 2022-07-26 DIAGNOSIS — F419 Anxiety disorder, unspecified: Secondary | ICD-10-CM | POA: Diagnosis present

## 2022-07-26 DIAGNOSIS — Z9842 Cataract extraction status, left eye: Secondary | ICD-10-CM

## 2022-07-26 DIAGNOSIS — F4321 Adjustment disorder with depressed mood: Secondary | ICD-10-CM

## 2022-07-26 DIAGNOSIS — I480 Paroxysmal atrial fibrillation: Secondary | ICD-10-CM | POA: Diagnosis present

## 2022-07-26 DIAGNOSIS — F32A Depression, unspecified: Principal | ICD-10-CM

## 2022-07-26 HISTORY — DX: Polyneuropathy, unspecified: G62.9

## 2022-07-26 LAB — CBC WITH DIFFERENTIAL/PLATELET
Abs Immature Granulocytes: 0.02 10*3/uL (ref 0.00–0.07)
Basophils Absolute: 0 10*3/uL (ref 0.0–0.1)
Basophils Relative: 0 %
Eosinophils Absolute: 0 10*3/uL (ref 0.0–0.5)
Eosinophils Relative: 0 %
HCT: 30.8 % — ABNORMAL LOW (ref 36.0–46.0)
Hemoglobin: 9.5 g/dL — ABNORMAL LOW (ref 12.0–15.0)
Immature Granulocytes: 0 %
Lymphocytes Relative: 10 %
Lymphs Abs: 1.1 10*3/uL (ref 0.7–4.0)
MCH: 20.7 pg — ABNORMAL LOW (ref 26.0–34.0)
MCHC: 30.8 g/dL (ref 30.0–36.0)
MCV: 67 fL — ABNORMAL LOW (ref 80.0–100.0)
Monocytes Absolute: 0.7 10*3/uL (ref 0.1–1.0)
Monocytes Relative: 6 %
Neutro Abs: 9.3 10*3/uL — ABNORMAL HIGH (ref 1.7–7.7)
Neutrophils Relative %: 84 %
Platelets: 313 10*3/uL (ref 150–400)
RBC: 4.6 MIL/uL (ref 3.87–5.11)
RDW: 24.9 % — ABNORMAL HIGH (ref 11.5–15.5)
WBC: 11.1 10*3/uL — ABNORMAL HIGH (ref 4.0–10.5)
nRBC: 0 % (ref 0.0–0.2)

## 2022-07-26 LAB — RESP PANEL BY RT-PCR (RSV, FLU A&B, COVID)  RVPGX2
Influenza A by PCR: NEGATIVE
Influenza B by PCR: NEGATIVE
Resp Syncytial Virus by PCR: NEGATIVE
SARS Coronavirus 2 by RT PCR: NEGATIVE

## 2022-07-26 LAB — RAPID URINE DRUG SCREEN, HOSP PERFORMED
Amphetamines: POSITIVE — AB
Barbiturates: NOT DETECTED
Benzodiazepines: NOT DETECTED
Cocaine: NOT DETECTED
Opiates: NOT DETECTED
Tetrahydrocannabinol: NOT DETECTED

## 2022-07-26 LAB — URINALYSIS, ROUTINE W REFLEX MICROSCOPIC
Bacteria, UA: NONE SEEN
Bilirubin Urine: NEGATIVE
Glucose, UA: NEGATIVE mg/dL
Ketones, ur: NEGATIVE mg/dL
Nitrite: NEGATIVE
Protein, ur: 30 mg/dL — AB
Specific Gravity, Urine: 1.013 (ref 1.005–1.030)
pH: 7 (ref 5.0–8.0)

## 2022-07-26 LAB — COMPREHENSIVE METABOLIC PANEL
ALT: 14 U/L (ref 0–44)
AST: 20 U/L (ref 15–41)
Albumin: 4.6 g/dL (ref 3.5–5.0)
Alkaline Phosphatase: 85 U/L (ref 38–126)
Anion gap: 11 (ref 5–15)
BUN: 12 mg/dL (ref 8–23)
CO2: 26 mmol/L (ref 22–32)
Calcium: 9.7 mg/dL (ref 8.9–10.3)
Chloride: 92 mmol/L — ABNORMAL LOW (ref 98–111)
Creatinine, Ser: 0.81 mg/dL (ref 0.44–1.00)
GFR, Estimated: >60 mL/min (ref >60)
Glucose, Bld: 112 mg/dL — ABNORMAL HIGH (ref 70–99)
Potassium: 3.6 mmol/L (ref 3.5–5.1)
Sodium: 129 mmol/L — ABNORMAL LOW (ref 135–145)
Total Bilirubin: 0.5 mg/dL (ref 0.3–1.2)
Total Protein: 7.3 g/dL (ref 6.5–8.1)

## 2022-07-26 LAB — SALICYLATE LEVEL: Salicylate Lvl: <7 mg/dL — ABNORMAL LOW (ref 7.0–30.0)

## 2022-07-26 LAB — ACETAMINOPHEN LEVEL: Acetaminophen (Tylenol), Serum: <10 ug/mL — ABNORMAL LOW (ref 10–30)

## 2022-07-26 LAB — ETHANOL: Alcohol, Ethyl (B): <10 mg/dL (ref <10)

## 2022-07-26 MED ORDER — ACETAMINOPHEN 325 MG PO TABS
650.0000 mg | ORAL_TABLET | Freq: Once | ORAL | Status: AC
  Administered 2022-07-26: 650 mg via ORAL
  Filled 2022-07-26: qty 2

## 2022-07-26 MED ORDER — GABAPENTIN 400 MG PO CAPS
800.0000 mg | ORAL_CAPSULE | Freq: Three times a day (TID) | ORAL | Status: DC
  Administered 2022-07-26 – 2022-07-30 (×11): 800 mg via ORAL
  Filled 2022-07-26 (×11): qty 2

## 2022-07-26 MED ORDER — NICOTINE 14 MG/24HR TD PT24
14.0000 mg | MEDICATED_PATCH | Freq: Every day | TRANSDERMAL | Status: DC
  Administered 2022-07-27 – 2022-07-30 (×4): 14 mg via TRANSDERMAL
  Filled 2022-07-26 (×4): qty 1

## 2022-07-26 MED ORDER — NICOTINE 7 MG/24HR TD PT24
7.0000 mg | MEDICATED_PATCH | Freq: Once | TRANSDERMAL | Status: AC
  Administered 2022-07-26: 7 mg via TRANSDERMAL
  Filled 2022-07-26: qty 1

## 2022-07-26 MED ORDER — APIXABAN 5 MG PO TABS
5.0000 mg | ORAL_TABLET | Freq: Two times a day (BID) | ORAL | Status: DC
  Administered 2022-07-26 – 2022-07-30 (×8): 5 mg via ORAL
  Filled 2022-07-26 (×9): qty 1

## 2022-07-26 MED ORDER — NICOTINE 14 MG/24HR TD PT24: 14.0000 mg | MEDICATED_PATCH | Freq: Every day | TRANSDERMAL | Status: DC

## 2022-07-26 MED ORDER — GABAPENTIN 100 MG PO CAPS
200.0000 mg | ORAL_CAPSULE | Freq: Once | ORAL | Status: AC
  Administered 2022-07-26: 200 mg via ORAL
  Filled 2022-07-26: qty 2

## 2022-07-26 MED ORDER — AMIODARONE HCL 200 MG PO TABS
200.0000 mg | ORAL_TABLET | Freq: Every day | ORAL | Status: DC
  Administered 2022-07-26 – 2022-07-30 (×5): 200 mg via ORAL
  Filled 2022-07-26 (×5): qty 1

## 2022-07-26 MED ORDER — SERTRALINE HCL 50 MG PO TABS
25.0000 mg | ORAL_TABLET | Freq: Every day | ORAL | Status: DC
  Administered 2022-07-26 – 2022-07-27 (×2): 25 mg via ORAL
  Filled 2022-07-26 (×2): qty 1

## 2022-07-26 NOTE — ED Notes (Signed)
Pt had episode of diarrhea. Pt cleaned and new brief applied. Pt aware urine sample is needed but states she isn't able to provide one at this time. Pt asking for water and says that she will be able to pee right after she drinks it. Female urinal provided. TTS cart at bedside.

## 2022-07-26 NOTE — ED Provider Notes (Signed)
[name] EMERGENCY DEPARTMENT AT [facility] Provider Note   CSN: [id number] Arrival date & time: 07/26/22  [id number]     History  Chief Complaint  Patient presents with   Medical Clearance    Karen Casey is a 67 y.o. female.  The history is provided by the patient and medical records.   67 y.o. F with hx of alcohol abuse, COPD, depression, GERD, HTN, PAF on eliquis, presenting to the ED for psychiatric evaluation.  Patient's husband died in June 12, 2023 and she has not been handling this well.  Her dog also recently died.  She reports feeling as if she is having a "mental breakdown".  EMS reports upon their arrival to the home, several diapers noted containing feces scattered around the house.  Patient defecated on herself en route to the hospital and smeared feces on the back of the ambulance.  She is supposed to be moving in with her daughter in march.  No one else available at bedside for collateral information.  Home Medications Prior to Admission medications   Medication Sig Start Date End Date Taking? Authorizing Provider  albuterol (VENTOLIN HFA) 108 (90 Base) MCG/ACT inhaler Inhale 2 puffs into the lungs every 6 (six) hours as needed for wheezing or shortness of breath. 09/23/21   [name], MD  amiodarone (PACERONE) 200 MG tablet Take 1 tablet (200 mg total) by mouth daily. 03/04/22   Zenia Resides, MD  apixaban (ELIQUIS) 5 MG TABS tablet Take 1 tablet (5 mg total) by mouth 2 (two) times daily. Patient not taking: Reported on 06/11/2022 03/04/22   Zenia Resides, MD  busPIRone (BUSPAR) 10 MG tablet Take 10 mg by mouth 3 (three) times daily as needed (anxiety).    [provider]  CALCIUM PO Take 1 tablet by mouth daily.    [provider]  Cholecalciferol (VITAMIN D3 PO) Take 1 capsule by mouth daily.    [provider]  Fluticasone-Umeclidin-Vilant (TRELEGY ELLIPTA) 100-62.5-25 MCG/ACT AEPB Inhale 2 puffs into the lungs  daily. Patient not taking: Reported on 06/11/2022    [provider]  gabapentin (NEURONTIN) 800 MG tablet Take 1 tablet (800 mg total) by mouth 3 (three) times daily. 06/11/22   Zenia Resides, MD  hydrOXYzine (ATARAX) 10 MG tablet TAKE 1 TABLET BY MOUTH 3 TIMES DAILY AS NEEDED FOR ANXIETY. 04/02/22   [name], MD  Multiple Vitamins-Minerals (CENTRUM SILVER 50+WOMEN PO) Take 1 tablet by mouth daily.    [provider]  QUEtiapine (SEROQUEL) 200 MG tablet TAKE 1 TABLET BY MOUTH AT BEDTIME. 07/05/22   Dickie La, MD      Allergies    Capsaicin-menthol and Diclo gel [diclofenac sodium]    Review of Systems   Review of Systems  Psychiatric/Behavioral:  Positive for behavioral problems.   All other systems reviewed and are negative.   Physical Exam Updated Vital Signs BP (!) 88/60 (BP Location: Right Arm)   Pulse 84   Temp 97.8 F (36.6 C) (Oral)   Resp 18   Ht 5' 5"$  (1.651 m)   Wt 60.8 kg   SpO2 94%   BMI 22.30 kg/m   Physical Exam Vitals and nursing note reviewed.  Constitutional:      Appearance: She is well-developed.  HENT:     Head: Normocephalic and atraumatic.  Eyes:     Conjunctiva/sclera: Conjunctivae normal.     Pupils: Pupils are equal, round, and reactive to light.  Cardiovascular:  Rate and Rhythm: Normal rate and regular rhythm.     Heart sounds: Normal heart sounds.  Pulmonary:     Effort: Pulmonary effort is normal.     Breath sounds: Normal breath sounds.  Abdominal:     General: Bowel sounds are normal.     Palpations: Abdomen is soft.  Musculoskeletal:        General: Normal range of motion.     Cervical back: Normal range of motion.  Skin:    General: Skin is warm and dry.  Neurological:     Mental Status: She is alert and oriented to person, place, and time.  Psychiatric:     Comments: No voiced SI/HI     ED Results / Procedures / Treatments   Labs (all labs ordered are listed, but only abnormal results  are displayed) Labs Reviewed  CBC WITH DIFFERENTIAL/PLATELET - Abnormal; Notable for the following components:      Result Value   WBC 11.1 (*)    Hemoglobin 9.5 (*)    HCT 30.8 (*)    MCV 67.0 (*)    MCH 20.7 (*)    RDW 24.9 (*)    Neutro Abs 9.3 (*)    All other components within normal limits  COMPREHENSIVE METABOLIC PANEL - Abnormal; Notable for the following components:   Sodium 129 (*)    Chloride 92 (*)    Glucose, Bld 112 (*)    All other components within normal limits  SALICYLATE LEVEL - Abnormal; Notable for the following components:   Salicylate Lvl [id number] (*)    All other components within normal limits  ACETAMINOPHEN LEVEL - Abnormal; Notable for the following components:   Acetaminophen (Tylenol), Serum <10 (*)    All other components within normal limits  RESP PANEL BY RT-PCR (RSV, FLU A&B, COVID)  [id number]  ETHANOL  RAPID URINE DRUG SCREEN, HOSP PERFORMED  URINALYSIS, ROUTINE W REFLEX MICROSCOPIC    EKG EKG Interpretation  Date/Time:  Monday July 26 2022 04:16:44 EST Ventricular Rate:  70 PR Interval:  169 QRS Duration: 84 QT Interval:  426 QTC Calculation: 460 R Axis:   32 Text Interpretation: Sinus rhythm Probable left atrial enlargement Minimal ST elevation, inferior leads No significant change was found Confirmed by Shanon Rosser [phone]) on 07/26/2022 4:38:32 AM  Radiology No results found.  Procedures Procedures    Medications Ordered in ED Medications - No data to display  ED Course/ Medical Decision Making/ A&P                             Medical Decision Making Amount and/or Complexity of Data Reviewed Labs: ordered. Radiology: ordered and independent interpretation performed. ECG/medicine tests: ordered and independent interpretation performed.   67 y.o. F here for psychiatric evaluation.  Sounds like she has had a lot of depression after death of her dog followed by her husband.  She is clearly not caring for herself, home was  covered in feces, etc.  Denies any frank SI/HI.     EKG NSR, no acute abnormality.  Labs were obtained-- does have some minor electrolyte abnormalities but very similar to prior based on chart review.  Negative ethanol, APAP, and salicylate levels.  CXR clear.  Awaiting UA and UDS.  RVP in process.  We will get TTS consult.  6:39 AM TTS evaluation not done overnight.  Day team to follow-up on recommendations.  Final Clinical Impression(s) / ED Diagnoses Final diagnoses:  Depression, unspecified depression type    Rx / DC Orders ED Discharge Orders     None         Larene Pickett, PA-C 07/26/22 0640    Shanon Rosser, MD 07/26/22 [phone]

## 2022-07-26 NOTE — ED Notes (Signed)
Pt refusing catheter for urine

## 2022-07-26 NOTE — ED Triage Notes (Signed)
States that her husband died in May 29, 2023 and she feels like she is having a mental breakdown, ems states the  house is full of feces. Also put her hand in her dirty diaper and placed it all over the back of the EMS. Will not answer many questions, is due to move in with the daughter in march.

## 2022-07-26 NOTE — Consult Note (Signed)
[facility] ED ASSESSMENT   Reason for Consult:  Psychiatry evaluation Referring Physician:  ER Physician. Patient Identification: Karen Casey MRN:  [id number] ED Chief Complaint: Severe episode of recurrent major depressive disorder, without psychotic features ([place])  Diagnosis:  Principal Problem:   Severe episode of recurrent major depressive disorder, without psychotic features [facility])   ED Assessment Time Calculation: Start Time: 1111 Stop Time: 1145 Total Time in Minutes (Assessment Completion): 34   Subjective:   Karen Casey is a 67 y.o. female patient admitted with previous Psychiatrist hx of Anxiety, alcoholism, Xanax addiction and depression was brought to the ER via EMS for increased episode of depression and anxiety since husband died in 2023-06-06  and losing her dog as well.  Triage note also states that patient has been having loose stools at home.  HPI:  Patient was seen in the ER room  awake, alert but irritable, rambling words and some confused.  She reports she is not feeling well, she reports she lost her husband in 2023/06/06 and her dog last year.  Patient also reports loose stool, not eating and feeling very tired.  Patient reports that she knows she is depressed but she added" I think I am not medically sound"  She rated Anxiety and depression 10/10 with 10 being severe depression and anxiety.  Patient reports lack of urine for a day or so as well.  She has been hospitalized in the past for Alcoholism and addiction to Xanax.  Patient denies SI/HI/AVH and no mention of paranoia.  Patient does not have a Psychiatrist but PCO prescribed Hydroxyzine for her.  She states Hydroxyzine is not helping and continues to ask for Xanax which she has been told we cannot give. Collateral information from sister   Ilene Qua states that patient has not been doing well since her husband who was her care giver passed on in June 06, 2023.  MS Debbie reports that patient has been acting strange, not  eating and not able to care for herself.  She added that patient has been in and out of the hospitals for issues related to her chemistry.  She denies any inpatient Psychiatric hospitalization .  She also added that her sister will benefit from Psychiatry care but she also need medical care.  She reports that patient is losing weight, not sleeping as well. Lab results are reviewed with Sodium 129, WBC going up.  Overall lab results are concerning but we do not have urinalysis at this time.  Patient will be considered for inpatient Psychiatry care when Medically cleared.  If patient is Medically admitted Psychiatry will be involved in her care as consult and will follow her.   Past Psychiatric History: previous Psychiatrist hx of Anxiety, alcoholism, Xanax addiction and depression.  Previous Psych hospitalization in 1996 for Xanax addiction and alcoholism.  She has no outpatient Psychiatrist and PCP prescribes Hydroxyzine.  Risk to Self or Others: Is the patient at risk to self? No Has the patient been a risk to self in the past 6 months? No Has the patient been a risk to self within the distant past? No Is the patient a risk to others? No Has the patient been a risk to others in the past 6 months? No Has the patient been a risk to others within the distant past? No  [place] Scale:  [place] ED from 07/26/2022 in [facility] Emergency Department at [facility] ED from 06/24/2022 in [facility] Emergency Department at [facility]  ED from 02/08/2022 in [facility] Emergency Department at [place] No Risk No Risk No Risk       AIMS:  , , ,  ,   ASAM:    Substance Abuse:     Past Medical History:  Past Medical History:  Diagnosis Date   Alcohol abuse    last use 03/09/21, marijuana last 03/09/21   Allergy    Anxiety    Cataract 06/09/2012   Right eye and left eye   COPD (chronic obstructive pulmonary disease) ([place])    Depression     Drug withdrawal seizure with complication ([place]) [id number]   Due to benzodiazepine withdrawal   Dyspnea    uses oxygen 2L via [place] prn   Enterococcal bacteremia 02/10/2022   GERD (gastroesophageal reflux disease)    Headache    Hypertension    Long term (current) use of anticoagulants    Nausea vomiting and diarrhea 02/09/2022   Neuromuscular disorder ([place])    Neuropathy    Positive blood culture 02/10/2022   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   Seizure ([place])    08/2020 per patient   Seizures ([place])    xanax withdrawl- December 2013   Urinary incontinence 06/09/2012    Past Surgical History:  Procedure Laterality Date   APPENDECTOMY     BIOPSY  01/04/2021   Procedure: BIOPSY;  Surgeon: Yetta Flock, MD;  Location: [place];  Service: Gastroenterology;;   BIOPSY  03/12/2021   Procedure: BIOPSY;  Surgeon: [place] Copas., MD;  Location: [place];  Service: Gastroenterology;;   CARDIOVERSION N/A 01/28/2022   Procedure: CARDIOVERSION;  Surgeon: Adrian Prows, MD;  Location: [place];  Service: Cardiovascular;  Laterality: N/A;   CATARACT EXTRACTION  06/09/2012   Left eye   COLONOSCOPY WITH PROPOFOL N/A 03/12/2021   Procedure: COLONOSCOPY WITH PROPOFOL;  Surgeon: [place] Copas., MD;  Location: [place];  Service: Gastroenterology;  Laterality: N/A;   ESOPHAGOGASTRODUODENOSCOPY (EGD) WITH PROPOFOL N/A 01/04/2021   Procedure: ESOPHAGOGASTRODUODENOSCOPY (EGD) WITH PROPOFOL;  Surgeon: Yetta Flock, MD;  Location: [place];  Service: Gastroenterology;  Laterality: N/A;   left shoulder dislocation  Sept 2011   POLYPECTOMY  03/12/2021   Procedure: POLYPECTOMY;  Surgeon: Mansouraty, Telford Nab., MD;  Location: [facility] ENDOSCOPY;  Service: Gastroenterology;;   Family History:  Family History  Problem Relation Age of Onset   Hypertension Mother    Hyperlipidemia Mother    Aneurysm Mother        Rupture - Cause of death   Heart  disease Father        MI - cause of death   Depression Father    Parkinsonism Father    Hypertension Sister    Colon cancer Neg Hx    Esophageal cancer Neg Hx    Rectal cancer Neg Hx    Stomach cancer Neg Hx    Family Psychiatric  History: Denies Social History:  Social History   Substance and Sexual Activity  Alcohol Use Yes   Alcohol/week: 3.0 standard drinks of alcohol   Types: 3 Cans of beer per week   Comment: 1-2 times week just beer     Social History   Substance and Sexual Activity  Drug Use Yes   Types: Marijuana, Other-see comments   Comment: Past hx of benzo abuse--xanax    Social History   Socioeconomic History   Marital status: Single    Spouse name: Not  on file   Number of children: 1   Years of education: Not on file   Highest education level: Not on file  Occupational History   Not on file  Tobacco Use   Smoking status: Every Day    Packs/day: 0.50    Years: 43.00    Total pack years: 21.50    Types: Cigarettes   Smokeless tobacco: Never   Tobacco comments:    Tobacco info given  Vaping Use   Vaping Use: Never used  Substance and Sexual Activity   Alcohol use: Yes    Alcohol/week: 3.0 standard drinks of alcohol    Types: 3 Cans of beer per week    Comment: 1-2 times week just beer   Drug use: Yes    Types: Marijuana, Other-see comments    Comment: Past hx of benzo abuse--xanax   Sexual activity: Not on file  Other Topics Concern   Not on file  Social History Narrative   Not on file   Social Determinants of Health   Financial Resource Strain: Not on file  Food Insecurity: No Food Insecurity (12/25/2021)   Hunger Vital Sign    Worried About Running Out of Food in the Last Year: Never true    Ran Out of Food in the Last Year: Never true  Transportation Needs: Not on file  Physical Activity: Not on file  Stress: Not on file  Social Connections: Not on file   Additional Social History:    Allergies:   Allergies  Allergen  Reactions   Capsaicin-Menthol Rash and Other (See Comments)    Burning and peeling on area applied to   Diclo Gel [Diclofenac Sodium] Rash and Other (See Comments)    Burning and peeling at the sight of application.    Labs:  Results for orders placed or performed during the hospital encounter of 07/26/22 (from the past 48 hour(s))  Resp panel by RT-PCR (RSV, Flu A&B, Covid) Anterior Nasal Swab     Status: None   Collection Time: 07/26/22  3:18 AM   Specimen: Anterior Nasal Swab  Result Value Ref Range   SARS Coronavirus 2 by RT PCR NEGATIVE NEGATIVE    Comment: (NOTE) SARS-CoV-2 target nucleic acids are NOT DETECTED.  The SARS-CoV-2 RNA is generally detectable in upper respiratory specimens during the acute phase of infection. The lowest concentration of SARS-CoV-2 viral copies this assay can detect is 138 copies/mL. A negative result does not preclude SARS-Cov-2 infection and should not be used as the sole basis for treatment or other patient management decisions. A negative result may occur with  improper specimen collection/handling, submission of specimen other than nasopharyngeal swab, presence of viral mutation(s) within the areas targeted by this assay, and inadequate number of viral copies(<138 copies/mL). A negative result must be combined with clinical observations, patient history, and epidemiological information. The expected result is Negative.  Fact Sheet for Patients:  EntrepreneurPulse.com.au  Fact Sheet for Healthcare Providers:  IncredibleEmployment.be  This test is no t yet approved or cleared by the [place] FDA and  has been authorized for detection and/or diagnosis of SARS-CoV-2 by FDA under an Emergency Use Authorization (EUA). This EUA will remain  in effect (meaning this test can be used) for the duration of the COVID-19 declaration under Section 564(b)(1) of the Act, 21 U.S.C.section 360bbb-3(b)(1), unless the  authorization is terminated  or revoked sooner.       Influenza A by PCR NEGATIVE NEGATIVE   Influenza B by PCR  NEGATIVE NEGATIVE    Comment: (NOTE) The Xpert Xpress SARS-CoV-2/FLU/RSV plus assay is intended as an aid in the diagnosis of influenza from Nasopharyngeal swab specimens and should not be used as a sole basis for treatment. Nasal washings and aspirates are unacceptable for Xpert Xpress SARS-CoV-2/FLU/RSV testing.  Fact Sheet for Patients: EntrepreneurPulse.com.au  Fact Sheet for Healthcare Providers: IncredibleEmployment.be  This test is not yet approved or cleared by the [place] FDA and has been authorized for detection and/or diagnosis of SARS-CoV-2 by FDA under an Emergency Use Authorization (EUA). This EUA will remain in effect (meaning this test can be used) for the duration of the COVID-19 declaration under Section 564(b)(1) of the Act, 21 U.S.C. section 360bbb-3(b)(1), unless the authorization is terminated or revoked.     Resp Syncytial Virus by PCR NEGATIVE NEGATIVE    Comment: (NOTE) Fact Sheet for Patients: EntrepreneurPulse.com.au  Fact Sheet for Healthcare Providers: IncredibleEmployment.be  This test is not yet approved or cleared by the [place] FDA and has been authorized for detection and/or diagnosis of SARS-CoV-2 by FDA under an Emergency Use Authorization (EUA). This EUA will remain in effect (meaning this test can be used) for the duration of the COVID-19 declaration under Section 564(b)(1) of the Act, 21 U.S.C. section 360bbb-3(b)(1), unless the authorization is terminated or revoked.  Performed at [facility], [place] [address], [place], [place] [address]   CBC with Differential     Status: Abnormal   Collection Time: 07/26/22  4:26 AM  Result Value Ref Range   WBC 11.1 (H) 4.0 - 10.5 K/uL   RBC 4.60 3.87 - 5.11 MIL/uL   Hemoglobin  9.5 (L) 12.0 - 15.0 g/dL   HCT 30.8 (L) 36.0 - 46.0 %   MCV 67.0 (L) 80.0 - 100.0 fL   MCH 20.7 (L) 26.0 - 34.0 pg   MCHC 30.8 30.0 - 36.0 g/dL   RDW 24.9 (H) 11.5 - 15.5 %   Platelets 313 150 - 400 K/uL   nRBC 0.0 0.0 - 0.2 %   Neutrophils Relative % 84 %   Neutro Abs 9.3 (H) 1.7 - 7.7 K/uL   Lymphocytes Relative 10 %   Lymphs Abs 1.1 0.7 - 4.0 K/uL   Monocytes Relative 6 %   Monocytes Absolute 0.7 0.1 - 1.0 K/uL   Eosinophils Relative 0 %   Eosinophils Absolute 0.0 0.0 - 0.5 K/uL   Basophils Relative 0 %   Basophils Absolute 0.0 0.0 - 0.1 K/uL   Immature Granulocytes 0 %   Abs Immature Granulocytes 0.02 0.00 - 0.07 K/uL   Target Cells PRESENT    Ovalocytes PRESENT     Comment: Performed at [facility], [place] [address] [place], [place] [address]  Comprehensive metabolic panel     Status: Abnormal   Collection Time: 07/26/22  4:26 AM  Result Value Ref Range   Sodium 129 (L) 135 - 145 mmol/L   Potassium 3.6 3.5 - 5.1 mmol/L   Chloride 92 (L) 98 - 111 mmol/L   CO2 26 22 - 32 mmol/L   Glucose, Bld 112 (H) 70 - 99 mg/dL    Comment: Glucose reference range applies only to samples taken after fasting for at least 8 hours.   BUN 12 8 - 23 mg/dL   Creatinine, Ser 0.81 0.44 - 1.00 mg/dL   Calcium 9.7 8.9 - 10.3 mg/dL   Total Protein 7.3 6.5 - 8.1 g/dL   Albumin 4.6 3.5 - 5.0 g/dL  AST 20 15 - 41 U/L   ALT 14 0 - 44 U/L   Alkaline Phosphatase 85 38 - 126 U/L   Total Bilirubin 0.5 0.3 - 1.2 mg/dL   GFR, Estimated >60 >60 mL/min    Comment: (NOTE) Calculated using the CKD-EPI Creatinine Equation (2021)    Anion gap 11 5 - 15    Comment: Performed at [facility], [place] [address] [place], [place] [address]  Ethanol     Status: None   Collection Time: 07/26/22  4:26 AM  Result Value Ref Range   Alcohol, Ethyl (B) <10 <10 mg/dL    Comment: (NOTE) Lowest detectable limit for serum alcohol is 10 mg/dL.  For medical purposes only. Performed  at [facility], [place] [address] [place], [place] [id number]   Salicylate level     Status: Abnormal   Collection Time: 07/26/22  4:26 AM  Result Value Ref Range   Salicylate Lvl Q000111Q (L) 7.0 - 30.0 mg/dL    Comment: Performed at [facility], [place] [address] [place], [place] [address]  Acetaminophen level     Status: Abnormal   Collection Time: 07/26/22  4:26 AM  Result Value Ref Range   Acetaminophen (Tylenol), Serum <10 (L) 10 - 30 ug/mL    Comment: (NOTE) Therapeutic concentrations vary significantly. A range of 10-30 ug/mL  may be an effective concentration for many patients. However, some  are best treated at concentrations outside of this range. Acetaminophen concentrations >150 ug/mL at 4 hours after ingestion  and >50 ug/mL at 12 hours after ingestion are often associated with  toxic reactions.  Performed at [facility], [place] [address], [place], [name] [address]     Current Facility-Administered Medications  Medication Dose Route Frequency Provider Last Rate Last Admin   sertraline (ZOLOFT) tablet 25 mg  25 mg Oral Daily Charmaine Downs C, NP       Current Outpatient Medications  Medication Sig Dispense Refill   amiodarone (PACERONE) 200 MG tablet Take 1 tablet (200 mg total) by mouth daily. 90 tablet 3   gabapentin (NEURONTIN) 800 MG tablet Take 1 tablet (800 mg total) by mouth 3 (three) times daily. 90 tablet 12   hydrOXYzine (ATARAX) 10 MG tablet TAKE 1 TABLET BY MOUTH 3 TIMES DAILY AS NEEDED FOR ANXIETY. 270 tablet 2   nicotine (NICODERM CQ - DOSED IN MG/24 HOURS) 14 mg/24hr patch Place 14 mg onto the skin daily.     QUEtiapine (SEROQUEL) 200 MG tablet TAKE 1 TABLET BY MOUTH AT BEDTIME. 90 tablet 0   albuterol (VENTOLIN HFA) 108 (90 Base) MCG/ACT inhaler Inhale 2 puffs into the lungs every 6 (six) hours as needed for wheezing or shortness of breath. (Patient not taking: Reported on 07/26/2022) 1 each 2    apixaban (ELIQUIS) 5 MG TABS tablet Take 1 tablet (5 mg total) by mouth 2 (two) times daily. (Patient not taking: Reported on 06/11/2022) 180 tablet 3   busPIRone (BUSPAR) 10 MG tablet Take 10 mg by mouth 3 (three) times daily as needed (anxiety). (Patient not taking: Reported on 07/26/2022)     Fluticasone-Umeclidin-Vilant (TRELEGY ELLIPTA) 100-62.5-25 MCG/ACT AEPB Inhale 2 puffs into the lungs daily. (Patient not taking: Reported on 06/11/2022)      Musculoskeletal: Strength & Muscle Tone:  seen in bed, states she ambulates with walker Gait & Station:  see above Patient leans:  see above   Psychiatric Specialty Exam: Presentation  General Appearance:  Disheveled; Casual; Other (comment) (ill appearing, emaciated)  Eye Contact: Fleeting  Speech: Clear and Coherent; Other (comment) (Rambling at times.)  Speech Volume: Normal  Handedness: Right   Mood and Affect  Mood: Irritable; Anxious; Depressed; Angry  Affect: Congruent; Depressed; Labile   Thought Process  Thought Processes: Irrevelant  Descriptions of Associations:Loose  Orientation:Partial  Thought Content:Logical  History of Schizophrenia/Schizoaffective disorder:No data recorded Duration of Psychotic Symptoms:No data recorded Hallucinations:Hallucinations: None  Ideas of Reference:None  Suicidal Thoughts:Suicidal Thoughts: No  Homicidal Thoughts:Homicidal Thoughts: No   Sensorium  Memory: Immediate Good; Recent Good; Remote Fair  Judgment: Fair  Insight: Good   Executive Functions  Concentration: Fair  Attention Span: Poor  Recall: [place] of Knowledge: Good  Language: Fair   Psychomotor Activity  Psychomotor Activity: Psychomotor Activity: Increased   Assets  Assets: Communication Skills; Desire for Improvement; Resilience    Sleep  Sleep: Sleep: Poor   Physical Exam: Physical Exam Vitals and nursing note reviewed.  Constitutional:      Appearance:  She is ill-appearing.  HENT:     Head: Normocephalic.     Nose: Nose normal.  Cardiovascular:     Rate and Rhythm: Normal rate.  Pulmonary:     Effort: Pulmonary effort is normal.  Musculoskeletal:     Cervical back: Normal range of motion.     Comments: Lesle Reek for ambulation  Skin:    General: Skin is warm and dry.  Neurological:     General: No focal deficit present.     Mental Status: She is alert and oriented to person, place, and time.    Review of Systems  Constitutional:  Positive for malaise/fatigue and weight loss.  HENT: Negative.    Eyes: Negative.   Respiratory: Negative.    Cardiovascular: Negative.   Gastrointestinal:  Positive for diarrhea.  Genitourinary:        Unable to void since arrival to the ER despite getting oral fluids  Musculoskeletal:        Utilizes walker for ambulation  Skin: Negative.   Neurological: Negative.   Endo/Heme/Allergies: Negative.   Psychiatric/Behavioral:  Positive for depression. The patient is nervous/anxious and has insomnia.    Blood pressure 114/61, pulse 75, temperature 97.8 F (36.6 C), temperature source Oral, resp. rate 20, height 5' 5"$  (1.651 m), weight 60.8 kg, SpO2 97 %. Body mass index is 22.3 kg/m.  Medical Decision Making: Patient will be considered for inpatient Psychiatry care when Medically cleared.  If patient is Medically admitted Psychiatry will be involved in her care as consult and will follow her. Will start low dose Sertraline for anxiety and depression.  Problem 1: Recurrent Major Depressive disorder, severe without Psychotic features.  Problem 2: Generalized anxiety disorder  Disposition: Recommend psychiatric Inpatient admission when medically cleared.  Delfin Gant, NP-PMHNP-BC 07/26/2022 11:55 AM

## 2022-07-26 NOTE — Telephone Encounter (Signed)
Patients sister Jackelyn Poling is calling and would like to speak with Dr. Thompson Grayer concerning some concerns she is having while patient is admitted.   The best call back for Jackelyn Poling is [phone]

## 2022-07-26 NOTE — ED Notes (Signed)
Patient has been alert. Patient has been anxious.  Patient disorganized. Patient redirectable. Patient medication compliant. Patient ambulatory with walker and assist.

## 2022-07-27 DIAGNOSIS — Z1152 Encounter for screening for COVID-19: Secondary | ICD-10-CM | POA: Diagnosis not present

## 2022-07-27 DIAGNOSIS — Z888 Allergy status to other drugs, medicaments and biological substances status: Secondary | ICD-10-CM | POA: Diagnosis not present

## 2022-07-27 DIAGNOSIS — F1091 Alcohol use, unspecified, in remission: Secondary | ICD-10-CM | POA: Diagnosis present

## 2022-07-27 DIAGNOSIS — K219 Gastro-esophageal reflux disease without esophagitis: Secondary | ICD-10-CM | POA: Diagnosis present

## 2022-07-27 DIAGNOSIS — F332 Major depressive disorder, recurrent severe without psychotic features: Secondary | ICD-10-CM | POA: Diagnosis present

## 2022-07-27 DIAGNOSIS — I219 Acute myocardial infarction, unspecified: Secondary | ICD-10-CM | POA: Diagnosis present

## 2022-07-27 DIAGNOSIS — Y900 Blood alcohol level of less than 20 mg/100 ml: Secondary | ICD-10-CM | POA: Diagnosis present

## 2022-07-27 DIAGNOSIS — J449 Chronic obstructive pulmonary disease, unspecified: Secondary | ICD-10-CM | POA: Diagnosis present

## 2022-07-27 DIAGNOSIS — I480 Paroxysmal atrial fibrillation: Secondary | ICD-10-CM | POA: Diagnosis present

## 2022-07-27 DIAGNOSIS — Z9842 Cataract extraction status, left eye: Secondary | ICD-10-CM | POA: Diagnosis not present

## 2022-07-27 DIAGNOSIS — E222 Syndrome of inappropriate secretion of antidiuretic hormone: Secondary | ICD-10-CM | POA: Diagnosis present

## 2022-07-27 DIAGNOSIS — F17210 Nicotine dependence, cigarettes, uncomplicated: Secondary | ICD-10-CM | POA: Diagnosis present

## 2022-07-27 DIAGNOSIS — G629 Polyneuropathy, unspecified: Secondary | ICD-10-CM | POA: Diagnosis present

## 2022-07-27 DIAGNOSIS — Z79899 Other long term (current) drug therapy: Secondary | ICD-10-CM | POA: Diagnosis not present

## 2022-07-27 DIAGNOSIS — F419 Anxiety disorder, unspecified: Secondary | ICD-10-CM | POA: Diagnosis present

## 2022-07-27 DIAGNOSIS — E871 Hypo-osmolality and hyponatremia: Secondary | ICD-10-CM | POA: Diagnosis present

## 2022-07-27 DIAGNOSIS — I700 Atherosclerosis of aorta: Secondary | ICD-10-CM | POA: Diagnosis present

## 2022-07-27 DIAGNOSIS — Z818 Family history of other mental and behavioral disorders: Secondary | ICD-10-CM | POA: Diagnosis not present

## 2022-07-27 DIAGNOSIS — I10 Essential (primary) hypertension: Secondary | ICD-10-CM | POA: Diagnosis present

## 2022-07-27 DIAGNOSIS — D649 Anemia, unspecified: Secondary | ICD-10-CM | POA: Diagnosis present

## 2022-07-27 DIAGNOSIS — Z83438 Family history of other disorder of lipoprotein metabolism and other lipidemia: Secondary | ICD-10-CM | POA: Diagnosis not present

## 2022-07-27 DIAGNOSIS — Z8249 Family history of ischemic heart disease and other diseases of the circulatory system: Secondary | ICD-10-CM | POA: Diagnosis not present

## 2022-07-27 LAB — BASIC METABOLIC PANEL
Anion gap: 10 (ref 5–15)
Anion gap: 10 (ref 5–15)
BUN: 6 mg/dL — ABNORMAL LOW (ref 8–23)
BUN: 6 mg/dL — ABNORMAL LOW (ref 8–23)
CO2: 24 mmol/L (ref 22–32)
CO2: 23 mmol/L (ref 22–32)
Calcium: 8.7 mg/dL — ABNORMAL LOW (ref 8.9–10.3)
Calcium: 8.5 mg/dL — ABNORMAL LOW (ref 8.9–10.3)
Chloride: 88 mmol/L — ABNORMAL LOW (ref 98–111)
Chloride: 91 mmol/L — ABNORMAL LOW (ref 98–111)
Creatinine, Ser: 0.55 mg/dL (ref 0.44–1.00)
Creatinine, Ser: 0.57 mg/dL (ref 0.44–1.00)
GFR, Estimated: >60 mL/min (ref >60)
GFR, Estimated: >60 mL/min (ref >60)
Glucose, Bld: 105 mg/dL — ABNORMAL HIGH (ref 70–99)
Glucose, Bld: 102 mg/dL — ABNORMAL HIGH (ref 70–99)
Potassium: 3.6 mmol/L (ref 3.5–5.1)
Potassium: 3.9 mmol/L (ref 3.5–5.1)
Sodium: 122 mmol/L — ABNORMAL LOW (ref 135–145)
Sodium: 124 mmol/L — ABNORMAL LOW (ref 135–145)

## 2022-07-27 LAB — SODIUM, URINE, RANDOM: Sodium, Ur: 113 mmol/L

## 2022-07-27 MED ORDER — ACETAMINOPHEN 650 MG RE SUPP: 650.0000 mg | Freq: Four times a day (QID) | RECTAL | Status: DC | PRN

## 2022-07-27 MED ORDER — IPRATROPIUM-ALBUTEROL 0.5-2.5 (3) MG/3ML IN SOLN: 3.0000 mL | RESPIRATORY_TRACT | Status: DC | PRN

## 2022-07-27 MED ORDER — ONDANSETRON HCL 4 MG/2ML IJ SOLN: 4.0000 mg | Freq: Four times a day (QID) | INTRAMUSCULAR | Status: DC | PRN

## 2022-07-27 MED ORDER — SODIUM CHLORIDE 0.9 % IV SOLN: INTRAVENOUS | Status: DC

## 2022-07-27 MED ORDER — SODIUM CHLORIDE 0.9 % IV BOLUS
1000.0000 mL | Freq: Once | INTRAVENOUS | Status: AC
  Administered 2022-07-27: 1000 mL via INTRAVENOUS

## 2022-07-27 MED ORDER — QUETIAPINE FUMARATE 100 MG PO TABS
200.0000 mg | ORAL_TABLET | Freq: Every day | ORAL | Status: DC
  Administered 2022-07-27 – 2022-07-29 (×3): 200 mg via ORAL
  Filled 2022-07-27 (×3): qty 2

## 2022-07-27 MED ORDER — BENZOCAINE 10 % MT GEL
Freq: Three times a day (TID) | OROMUCOSAL | Status: DC | PRN
  Filled 2022-07-27: qty 9.4

## 2022-07-27 MED ORDER — FLUTICASONE FUROATE-VILANTEROL 100-25 MCG/ACT IN AEPB
1.0000 | INHALATION_SPRAY | Freq: Every day | RESPIRATORY_TRACT | Status: DC
  Filled 2022-07-27: qty 28

## 2022-07-27 MED ORDER — UMECLIDINIUM BROMIDE 62.5 MCG/ACT IN AEPB
1.0000 | INHALATION_SPRAY | Freq: Every day | RESPIRATORY_TRACT | Status: DC
  Administered 2022-07-29 – 2022-07-30 (×2): 1 via RESPIRATORY_TRACT

## 2022-07-27 MED ORDER — FLUTICASONE FUROATE-VILANTEROL 100-25 MCG/ACT IN AEPB
1.0000 | INHALATION_SPRAY | Freq: Every day | RESPIRATORY_TRACT | Status: DC
  Administered 2022-07-28 – 2022-07-30 (×3): 1 via RESPIRATORY_TRACT

## 2022-07-27 MED ORDER — HYDROXYZINE HCL 10 MG PO TABS: 10.0000 mg | ORAL_TABLET | Freq: Three times a day (TID) | ORAL | Status: DC | PRN

## 2022-07-27 MED ORDER — ACETAMINOPHEN 325 MG PO TABS
650.0000 mg | ORAL_TABLET | Freq: Four times a day (QID) | ORAL | Status: DC | PRN
  Administered 2022-07-27 – 2022-07-29 (×3): 650 mg via ORAL
  Filled 2022-07-27 (×3): qty 2

## 2022-07-27 MED ORDER — UMECLIDINIUM BROMIDE 62.5 MCG/ACT IN AEPB
1.0000 | INHALATION_SPRAY | Freq: Every day | RESPIRATORY_TRACT | Status: DC
  Filled 2022-07-27: qty 7

## 2022-07-27 MED ORDER — ACETAMINOPHEN 500 MG PO TABS
1000.0000 mg | ORAL_TABLET | Freq: Once | ORAL | Status: AC
  Administered 2022-07-27: 1000 mg via ORAL
  Filled 2022-07-27: qty 2

## 2022-07-27 MED ORDER — ONDANSETRON HCL 4 MG PO TABS: 4.0000 mg | ORAL_TABLET | Freq: Four times a day (QID) | ORAL | Status: DC | PRN

## 2022-07-27 NOTE — ED Notes (Signed)
Pt is saying that she does not want to stay psych inpatient, wants her sister to come pick her up today. [place] NP notifed.

## 2022-07-27 NOTE — Progress Notes (Cosign Needed Addendum)
[facility] Psych ED Progress Note  07/27/2022 3:06 PM PAMI FAVRET  MRN:  [id number]   Principal Problem: Severe episode of recurrent major depressive disorder, without psychotic features ([place]) Diagnosis:  Principal Problem:   Severe episode of recurrent major depressive disorder, without psychotic features ([place]) Active Problems:   GERD (gastroesophageal reflux disease)   Tobacco abuse   Alcohol use   HTN (hypertension)   Paroxysmal atrial fibrillation (HCC)   COPD (chronic obstructive pulmonary disease) ([place])   Hyponatremia   ED Assessment Time Calculation: Start Time: 1100 Stop Time: 1120 Total Time in Minutes (Assessment Completion): 20   Subjective: Karen Casey, 67 y.o., female patient seen face to face by this provider, consulted with Dr. Dwyane Dee; and chart reviewed on 07/27/22.  On evaluation Karen Casey reports that she does not feel like she needs to go to an inpatient facility.  Patient agrees that she needs therapy for her depression and grief, but feels like an inpatient facility would not be good for her.  Patient asked provider if she can speak with her sister Karen Casey and allow her to go home.  Patient continues to deny SI/HI/AVH, says she will be living with her sister and moving in with her daughter in March.  Patient is able to maintain safety, and voiced that she will be safe.  During evaluation Karen Casey is laying in her bed in no acute distress. She continues to be alert, oriented x 3, calm, and cooperative. Her mood is euthymic with congruent affect.  She has normal speech, and behavior.  Objectively there is no evidence of psychosis/mania or delusional thinking.  Patient is able to converse coherently, goal directed thoughts, no distractibility, or pre-occupation. She denies suicidal/self-harm/homicidal ideation, psychosis, and paranoia.     Provider spoke with patient's sister with patient's permission Karen Casey, and she stated that she is  worried about patient and not sure if inpatient would be the best decision for for patient.  She says that she is familiar with inpatient facilities and they can be "loud, very irritating places and she feels that is not what her sister needs right now dealing with grief and depression.  Provider informed her of how inpatient facilities are ran, the patient will be safe and able to go to groups discussing depression and grief.  Karen Casey stated that her sister will be seeing her primary care physician at the end of this week, and that Karen Casey will be moving in with her sister to care for her, until she is moving in with her daughter in March.  Karen Casey states that she is retired and she will have time to care for her sister.  [name] is willing to work on a safety plan to help patient, which will include her being with patient daily, also preparing patient's medications and administering them to her.  Provider informed Karen Casey of behavioral health urgent care, and let her know that this facility is open to her sister for therapy and medication management.   At this time Karen Casey is educated and verbalizes understanding of mental health resources and other crisis services in the community. She is instructed to call 911 and present to the nearest emergency room should she experience any suicidal/homicidal ideation, auditory/visual/hallucinations, or detrimental worsening of her mental health condition.      Karen Casey:  [place] ED from 07/26/2022 in [facility] Emergency Department at [facility] ED from 06/24/2022 in [facility]  Emergency Department at [facility] ED from 02/08/2022 in [facility] Emergency Department at [place] Error: Q6 is Yes, you must answer 7 No Risk No Risk       Past Medical History:  Past Medical History:  Diagnosis Date   Alcohol abuse    last use 03/09/21, marijuana last 03/09/21   Allergy     Anxiety    Cataract 06/09/2012   Right eye and left eye   COPD (chronic obstructive pulmonary disease) ([place])    Depression    Drug withdrawal seizure with complication ([place]) [id number]   Due to benzodiazepine withdrawal   Dyspnea    uses oxygen 2L via [place] prn   Enterococcal bacteremia 02/10/2022   GERD (gastroesophageal reflux disease)    Headache    Hypertension    Long term (current) use of anticoagulants    Nausea vomiting and diarrhea 02/09/2022   Neuromuscular disorder ([place])    Neuropathy    Positive blood culture 02/10/2022   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   Seizure ([place])    08/2020 per patient   Seizures ([place])    xanax withdrawl- December 2013   Urinary incontinence 06/09/2012    Past Surgical History:  Procedure Laterality Date   APPENDECTOMY     BIOPSY  01/04/2021   Procedure: BIOPSY;  Surgeon: Yetta Flock, MD;  Location: [place];  Service: Gastroenterology;;   BIOPSY  03/12/2021   Procedure: BIOPSY;  Surgeon: Irving Copas., MD;  Location: [place];  Service: Gastroenterology;;   CARDIOVERSION N/A 01/28/2022   Procedure: CARDIOVERSION;  Surgeon: Adrian Prows, MD;  Location: [place];  Service: Cardiovascular;  Laterality: N/A;   CATARACT EXTRACTION  06/09/2012   Left eye   COLONOSCOPY WITH PROPOFOL N/A 03/12/2021   Procedure: COLONOSCOPY WITH PROPOFOL;  Surgeon: Irving Copas., MD;  Location: [place];  Service: Gastroenterology;  Laterality: N/A;   ESOPHAGOGASTRODUODENOSCOPY (EGD) WITH PROPOFOL N/A 01/04/2021   Procedure: ESOPHAGOGASTRODUODENOSCOPY (EGD) WITH PROPOFOL;  Surgeon: Yetta Flock, MD;  Location: [place];  Service: Gastroenterology;  Laterality: N/A;   left shoulder dislocation  Sept 2011   POLYPECTOMY  03/12/2021   Procedure: POLYPECTOMY;  Surgeon: Mansouraty, Telford Nab., MD;  Location[facility];  Service: Gastroenterology;;   Family History:  Family History  Problem  Relation Age of Onset   Hypertension Mother    Hyperlipidemia Mother    Aneurysm Mother        Rupture - Cause of death   Heart disease Father        MI - cause of death   Depression Father    Parkinsonism Father    Hypertension Sister    Colon cancer Neg Hx    Esophageal cancer Neg Hx    Rectal cancer Neg Hx    Stomach cancer Neg Hx     Social History:  Social History   Substance and Sexual Activity  Alcohol Use Yes   Alcohol/week: 3.0 standard drinks of alcohol   Types: 3 Cans of beer per week   Comment: 1-2 times week just beer     Social History   Substance and Sexual Activity  Drug Use Yes   Types: Marijuana, Other-see comments   Comment: Past hx of benzo abuse--xanax    Social History   Socioeconomic History   Marital status: Single    Spouse name: Not on file   Number of children: 1   Years  of education: Not on file   Highest education level: Not on file  Occupational History   Not on file  Tobacco Use   Smoking status: Every Day    Packs/day: 0.50    Years: 43.00    Total pack years: 21.50    Types: Cigarettes   Smokeless tobacco: Never   Tobacco comments:    Tobacco info given  Vaping Use   Vaping Use: Never used  Substance and Sexual Activity   Alcohol use: Yes    Alcohol/week: 3.0 standard drinks of alcohol    Types: 3 Cans of beer per week    Comment: 1-2 times week just beer   Drug use: Yes    Types: Marijuana, Other-see comments    Comment: Past hx of benzo abuse--xanax   Sexual activity: Not on file  Other Topics Concern   Not on file  Social History Narrative   Not on file   Social Determinants of Health   Financial Resource Strain: Not on file  Food Insecurity: No Food Insecurity (12/25/2021)   Hunger Vital Sign    Worried About Running Out of Food in the Last Year: Never true    Ran Out of Food in the Last Year: Never true  Transportation Needs: Not on file  Physical Activity: Not on file  Stress: Not on file  Social  Connections: Not on file    Sleep: Poor  Appetite:  Poor  Current Medications: Current Facility-Administered Medications  Medication Dose Route Frequency Provider Last Rate Last Admin   0.9 %  sodium chloride infusion   Intravenous Continuous Plunkett, Whitney, MD       amiodarone (PACERONE) tablet 200 mg  200 mg Oral Daily Milton Ferguson, MD   200 mg at 07/27/22 0926   apixaban (ELIQUIS) tablet 5 mg  5 mg Oral BID Milton Ferguson, MD   5 mg at 07/27/22 0926   benzocaine (ORAJEL) 10 % mucosal gel   Mouth/Throat TID PRN Blanchie Dessert, MD   Given at 07/27/22 1214   gabapentin (NEURONTIN) capsule 800 mg  800 mg Oral TID Milton Ferguson, MD   800 mg at 07/27/22 [id number]   nicotine (NICODERM CQ - dosed in mg/24 hours) patch 14 mg  14 mg Transdermal Daily Polly Cobia, RPH   14 mg at 07/27/22 [id number]   Current Outpatient Medications  Medication Sig Dispense Refill   amiodarone (PACERONE) 200 MG tablet Take 1 tablet (200 mg total) by mouth daily. 90 tablet 3   gabapentin (NEURONTIN) 800 MG tablet Take 1 tablet (800 mg total) by mouth 3 (three) times daily. 90 tablet 12   hydrOXYzine (ATARAX) 10 MG tablet TAKE 1 TABLET BY MOUTH 3 TIMES DAILY AS NEEDED FOR ANXIETY. 270 tablet 2   nicotine (NICODERM CQ - DOSED IN MG/24 HOURS) 14 mg/24hr patch Place 14 mg onto the skin daily.     QUEtiapine (SEROQUEL) 200 MG tablet TAKE 1 TABLET BY MOUTH AT BEDTIME. 90 tablet 0   albuterol (VENTOLIN HFA) 108 (90 Base) MCG/ACT inhaler Inhale 2 puffs into the lungs every 6 (six) hours as needed for wheezing or shortness of breath. (Patient not taking: Reported on 07/26/2022) 1 each 2   apixaban (ELIQUIS) 5 MG TABS tablet Take 1 tablet (5 mg total) by mouth 2 (two) times daily. (Patient not taking: Reported on 06/11/2022) 180 tablet 3   busPIRone (BUSPAR) 10 MG tablet Take 10 mg by mouth 3 (three) times daily as needed (anxiety). (Patient not taking: Reported  on 07/26/2022)     Fluticasone-Umeclidin-Vilant (TRELEGY ELLIPTA)  100-62.5-25 MCG/ACT AEPB Inhale 2 puffs into the lungs daily. (Patient not taking: Reported on 06/11/2022)      Lab Results:  Results for orders placed or performed during the hospital encounter of 07/26/22 (from the past 48 hour(s))  Resp panel by RT-PCR (RSV, Flu A&B, Covid) Anterior Nasal Swab     Status: None   Collection Time: 07/26/22  3:18 AM   Specimen: Anterior Nasal Swab  Result Value Ref Range   SARS Coronavirus 2 by RT PCR NEGATIVE NEGATIVE    Comment: (NOTE) SARS-CoV-2 target nucleic acids are NOT DETECTED.  The SARS-CoV-2 RNA is generally detectable in upper respiratory specimens during the acute phase of infection. The lowest concentration of SARS-CoV-2 viral copies this assay can detect is 138 copies/mL. A negative result does not preclude SARS-Cov-2 infection and should not be used as the sole basis for treatment or other patient management decisions. A negative result may occur with  improper specimen collection/handling, submission of specimen other than nasopharyngeal swab, presence of viral mutation(s) within the areas targeted by this assay, and inadequate number of viral copies(<138 copies/mL). A negative result must be combined with clinical observations, patient history, and epidemiological information. The expected result is Negative.  Fact Sheet for Patients:  EntrepreneurPulse.com.au  Fact Sheet for Healthcare Providers:  IncredibleEmployment.be  This test is no t yet approved or cleared by the [place] FDA and  has been authorized for detection and/or diagnosis of SARS-CoV-2 by FDA under an Emergency Use Authorization (EUA). This EUA will remain  in effect (meaning this test can be used) for the duration of the COVID-19 declaration under Section 564(b)(1) of the Act, 21 U.S.C.section 360bbb-3(b)(1), unless the authorization is terminated  or revoked sooner.       Influenza A by PCR NEGATIVE NEGATIVE    Influenza B by PCR NEGATIVE NEGATIVE    Comment: (NOTE) The Xpert Xpress SARS-CoV-2/FLU/RSV plus assay is intended as an aid in the diagnosis of influenza from Nasopharyngeal swab specimens and should not be used as a sole basis for treatment. Nasal washings and aspirates are unacceptable for Xpert Xpress SARS-CoV-2/FLU/RSV testing.  Fact Sheet for Patients: EntrepreneurPulse.com.au  Fact Sheet for Healthcare Providers: IncredibleEmployment.be  This test is not yet approved or cleared by the [place] FDA and has been authorized for detection and/or diagnosis of SARS-CoV-2 by FDA under an Emergency Use Authorization (EUA). This EUA will remain in effect (meaning this test can be used) for the duration of the COVID-19 declaration under Section 564(b)(1) of the Act, 21 U.S.C. section 360bbb-3(b)(1), unless the authorization is terminated or revoked.     Resp Syncytial Virus by PCR NEGATIVE NEGATIVE    Comment: (NOTE) Fact Sheet for Patients: EntrepreneurPulse.com.au  Fact Sheet for Healthcare Providers: IncredibleEmployment.be  This test is not yet approved or cleared by the [place] FDA and has been authorized for detection and/or diagnosis of SARS-CoV-2 by FDA under an Emergency Use Authorization (EUA). This EUA will remain in effect (meaning this test can be used) for the duration of the COVID-19 declaration under Section 564(b)(1) of the Act, 21 U.S.C. section 360bbb-3(b)(1), unless the authorization is terminated or revoked.  Performed at [facility], [place] [address] [place], [place] [address]   CBC with Differential     Status: Abnormal   Collection Time: 07/26/22  4:26 AM  Result Value Ref Range   WBC 11.1 (H) 4.0 - 10.5 K/uL  RBC 4.60 3.87 - 5.11 MIL/uL   Hemoglobin 9.5 (L) 12.0 - 15.0 g/dL   HCT 30.8 (L) 36.0 - 46.0 %   MCV 67.0 (L) 80.0 - 100.0 fL   MCH 20.7  (L) 26.0 - 34.0 pg   MCHC 30.8 30.0 - 36.0 g/dL   RDW 24.9 (H) 11.5 - 15.5 %   Platelets 313 150 - 400 K/uL   nRBC 0.0 0.0 - 0.2 %   Neutrophils Relative % 84 %   Neutro Abs 9.3 (H) 1.7 - 7.7 K/uL   Lymphocytes Relative 10 %   Lymphs Abs 1.1 0.7 - 4.0 K/uL   Monocytes Relative 6 %   Monocytes Absolute 0.7 0.1 - 1.0 K/uL   Eosinophils Relative 0 %   Eosinophils Absolute 0.0 0.0 - 0.5 K/uL   Basophils Relative 0 %   Basophils Absolute 0.0 0.0 - 0.1 K/uL   Immature Granulocytes 0 %   Abs Immature Granulocytes 0.02 0.00 - 0.07 K/uL   Target Cells PRESENT    Ovalocytes PRESENT     Comment: Performed at [facility], [place] [address], [place], [place] [address]  Comprehensive metabolic panel     Status: Abnormal   Collection Time: 07/26/22  4:26 AM  Result Value Ref Range   Sodium 129 (L) 135 - 145 mmol/L   Potassium 3.6 3.5 - 5.1 mmol/L   Chloride 92 (L) 98 - 111 mmol/L   CO2 26 22 - 32 mmol/L   Glucose, Bld 112 (H) 70 - 99 mg/dL    Comment: Glucose reference range applies only to samples taken after fasting for at least 8 hours.   BUN 12 8 - 23 mg/dL   Creatinine, Ser 0.81 0.44 - 1.00 mg/dL   Calcium 9.7 8.9 - 10.3 mg/dL   Total Protein 7.3 6.5 - 8.1 g/dL   Albumin 4.6 3.5 - 5.0 g/dL   AST 20 15 - 41 U/L   ALT 14 0 - 44 U/L   Alkaline Phosphatase 85 38 - 126 U/L   Total Bilirubin 0.5 0.3 - 1.2 mg/dL   GFR, Estimated >60 >60 mL/min    Comment: (NOTE) Calculated using the CKD-EPI Creatinine Equation (2021)    Anion gap 11 5 - 15    Comment: Performed at [facility], [place] [address] [place], [place] [address]  Ethanol     Status: None   Collection Time: 07/26/22  4:26 AM  Result Value Ref Range   Alcohol, Ethyl (B) <10 <10 mg/dL    Comment: (NOTE) Lowest detectable limit for serum alcohol is 10 mg/dL.  For medical purposes only. Performed at [facility], [place] [address], [place], [place] [id number]   Salicylate  level     Status: Abnormal   Collection Time: 07/26/22  4:26 AM  Result Value Ref Range   Salicylate Lvl [id number] (L) 7.0 - 30.0 mg/dL    Comment: Performed at [facility], [place] [address], [place],  [address]  Acetaminophen level     Status: Abnormal   Collection Time: 07/26/22  4:26 AM  Result Value Ref Range   Acetaminophen (Tylenol), Serum <10 (L) 10 - 30 ug/mL    Comment: (NOTE) Therapeutic concentrations vary significantly. A range of 10-30 ug/mL  may be an effective concentration for many patients. However, some  are best treated at concentrations outside of this range. Acetaminophen concentrations >150 ug/mL at 4 hours after ingestion  and >50 ug/mL at 12 hours  after ingestion are often associated with  toxic reactions.  Performed at [facility], [place] [address], [place], [place] [address]   Rapid urine drug screen (hospital performed)     Status: Abnormal   Collection Time: 07/26/22 12:47 PM  Result Value Ref Range   Opiates NONE DETECTED NONE DETECTED   Cocaine NONE DETECTED NONE DETECTED   Benzodiazepines NONE DETECTED NONE DETECTED   Amphetamines POSITIVE (A) NONE DETECTED   Tetrahydrocannabinol NONE DETECTED NONE DETECTED   Barbiturates NONE DETECTED NONE DETECTED    Comment: (NOTE) DRUG SCREEN FOR MEDICAL PURPOSES ONLY.  IF CONFIRMATION IS NEEDED FOR ANY PURPOSE, NOTIFY LAB WITHIN 5 DAYS.  LOWEST DETECTABLE LIMITS FOR URINE DRUG SCREEN Drug Class                     Cutoff (ng/mL) Amphetamine and metabolites    1000 Barbiturate and metabolites    200 Benzodiazepine                 200 Opiates and metabolites        300 Cocaine and metabolites        300 THC                            50 Performed at [facility], [place] [address] [place], [place] [address]   Urinalysis, Routine w reflex microscopic -Urine, Clean Catch     Status: Abnormal   Collection Time: 07/26/22 12:47 PM  Result Value Ref  Range   Color, Urine YELLOW YELLOW   APPearance TURBID (A) CLEAR   Specific Gravity, Urine 1.013 1.005 - 1.030   pH 7.0 5.0 - 8.0   Glucose, UA NEGATIVE NEGATIVE mg/dL   Hgb urine dipstick SMALL (A) NEGATIVE   Bilirubin Urine NEGATIVE NEGATIVE   Ketones, ur NEGATIVE NEGATIVE mg/dL   Protein, ur 30 (A) NEGATIVE mg/dL   Nitrite NEGATIVE NEGATIVE   Leukocytes,Ua MODERATE (A) NEGATIVE   RBC / HPF 0-5 0 - 5 RBC/hpf   WBC, UA 0-5 0 - 5 WBC/hpf   Bacteria, UA NONE SEEN NONE SEEN   Squamous Epithelial / HPF 0-5 0 - 5 /HPF    Comment: Performed at [facility], [place] [address], [place], [place] [id number]  Basic metabolic panel     Status: Abnormal   Collection Time: 07/27/22  8:38 AM  Result Value Ref Range   Sodium 122 (L) 135 - 145 mmol/L    Comment: DELTA CHECK NOTED   Potassium 3.6 3.5 - 5.1 mmol/L   Chloride 88 (L) 98 - 111 mmol/L   CO2 24 22 - 32 mmol/L   Glucose, Bld 105 (H) 70 - 99 mg/dL    Comment: Glucose reference range applies only to samples taken after fasting for at least 8 hours.   BUN 6 (L) 8 - 23 mg/dL   Creatinine, Ser 0.55 0.44 - 1.00 mg/dL   Calcium 8.7 (L) 8.9 - 10.3 mg/dL   GFR, Estimated >60 >60 mL/min    Comment: (NOTE) Calculated using the CKD-EPI Creatinine Equation (2021)    Anion gap 10 5 - 15    Comment: Performed at [facility], [place], [place] [address], [place], [place] [id number]  Basic metabolic panel     Status: Abnormal   Collection Time: 07/27/22  2:16 PM  Result Value Ref Range   Sodium 124 (L) 135 - 145 mmol/L  Potassium 3.9 3.5 - 5.1 mmol/L   Chloride 91 (L) 98 - 111 mmol/L   CO2 23 22 - 32 mmol/L   Glucose, Bld 102 (H) 70 - 99 mg/dL    Comment: Glucose reference range applies only to samples taken after fasting for at least 8 hours.   BUN 6 (L) 8 - 23 mg/dL   Creatinine, Ser 0.57 0.44 - 1.00 mg/dL   Calcium 8.5 (L) 8.9 - 10.3 mg/dL   GFR, Estimated >60 >60 mL/min    Comment: (NOTE) Calculated using the  CKD-EPI Creatinine Equation (2021)    Anion gap 10 5 - 15    Comment: Performed at [facility], [place] [address], [place], [place] [address]    Blood Alcohol level:  Lab Results  Component Value Date   ETH <10 07/26/2022   ETH 39 (H) 06/24/2022    Physical Findings:  CIWA:    COWS:     Musculoskeletal: Strength & Muscle Tone: patient in bed, states she ambulates with walker Gait & Station: see above Patient leans: see above  Psychiatric Specialty Exam:  Presentation  General Appearance:  Disheveled  Eye Contact: Fair  Speech: Clear and Coherent  Speech Volume: Normal  Handedness: Right   Mood and Affect  Mood: Euthymic  Affect: Congruent; Depressed   Thought Process  Thought Processes: Coherent  Descriptions of Associations:Intact  Orientation:Partial  Thought Content:Logical  History of Schizophrenia/Schizoaffective disorder:No data recorded Duration of Psychotic Symptoms:No data recorded Hallucinations:Hallucinations: None  Ideas of Reference:None  Suicidal Thoughts:Suicidal Thoughts: No  Homicidal Thoughts:Homicidal Thoughts: No   Sensorium  Memory: Immediate Good; Remote Good  Judgment: Fair  Insight: Fair   Community education officer  Concentration: Good  Attention Span: Fair  Recall: [place] of Knowledge: Good  Language: Good   Psychomotor Activity  Psychomotor Activity: Psychomotor Activity: Increased   Assets  Assets: Communication Skills; Social Support; Housing   Sleep  Sleep: Sleep: Poor    Physical Exam: Physical Exam Vitals and nursing note reviewed.  Pulmonary:     [place]: Pulmonary effort is normal.  Neurological:     Mental Status: She is alert.  Psychiatric:        Attention and Perception: Attention normal.        Mood and Affect: Mood is depressed.        Speech: Speech normal.        Behavior: Behavior is cooperative.        Thought Content: Thought content  normal.        Judgment: Judgment is inappropriate.    Review of Systems  Constitutional: Negative.   Respiratory: Negative.    Skin: Negative.   Psychiatric/Behavioral:  Positive for depression.    Blood pressure 125/81, pulse 87, temperature (!) 97.3 F (36.3 C), temperature source Oral, resp. rate 19, height 5' 5"$  (1.651 m), weight 60.8 kg, SpO2 93 %. Body mass index is 22.3 kg/m.   Medical Decision Making: Patient is psychiatrically cleared. Patient denies SI, HI, AVH and is able to contract for safety. She demonstrated no overt evidence of psychosis or mania. She verbalized that they understood warning signs, triggers, and symptoms of worsening mental health and how to access emergency mental health care if they felt it was needed. Patient was instructed to call 911 or return to the emergency room if they experienced any concerning symptoms after discharge. Patient voiced understanding and agreed to the above. Patient sister Karen Casey, will be moving in with patient to  ensure her safety, she will also be administering her medications, and making sure patient gets to her appointments. Safety Plan RYLENN VULGAMORE will reach out to sister Karen Casey, or call 911 or call mobile crisis, or go to nearest emergency room if condition worsens or if suicidal thoughts become active Patients' will follow up with [facility] for outpatient psychiatric services (therapy/medication management).  The suicide prevention education provided includes the following: Suicide risk factors Suicide prevention and interventions National Suicide Hotline telephone number [facility] assessment telephone number [facility] Emergency Assistance [place] and/or Residential Mobile Crisis Unit telephone number Request made of family/significant other to:  Karen Casey Remove weapons (e.g., guns, rifles, knives), all items previously/currently identified as safety concern.   Remove  drugs/medications (over the counter, prescriptions, illicit drugs), all items previously/currently identified as a safety concern.     EDP is medically admitting patient due to hyponatremia. Zoloft discontinued at this time, due to hyponatremia. Consulted with Dr. Dwyane Dee.     Jonathin Heinicke MOTLEY-MANGRUM, PMHNP 07/27/2022, 3:06 PM

## 2022-07-27 NOTE — Progress Notes (Signed)
Nursing Supervisor made aware that Drugs have been found in the Patient's Room. Day Shift Nursing Supervisor along with the Night Shift Supervisor came to floor along with Security . The two Supervisors searched room along with the Charge Nurse and the Patient's Nurse and the drugs were taken by the Supervisor. The Medications will be taken to the Pharmacy by the Patient's Primary Nurse Rama Jallow RN. The lighters and cigarettes were secured and locked up in the suicide bin. Patient was made aware of Medications to be taken to Pharmacy and the cigarettes and lighters are secured.

## 2022-07-27 NOTE — Telephone Encounter (Signed)
Called patients sister, left VM to call back

## 2022-07-27 NOTE — ED Notes (Signed)
ED TO INPATIENT HANDOFF REPORT  ED Nurse Name and Phone #: Rocco Pauls Name/Age/Gender Karen Casey 67 y.o. female Room/Bed: WA28/WA28  Code Status   Code Status: Full Code  Home/SNF/Other Home Patient oriented to: self, place, and situation Is this baseline? Yes   Triage Complete: Triage complete  Chief Complaint Hyponatremia [E87.1]  Triage Note States that her husband died in 01-Jun-2023 and she feels like she is having a mental breakdown, ems states the  house is full of feces. Also put her hand in her dirty diaper and placed it all over the back of the EMS. Will not answer many questions, is due to move in with the daughter in march.    Allergies Allergies  Allergen Reactions   Capsaicin-Menthol Rash and Other (See Comments)    Burning and peeling on area applied to   Diclo Gel [Diclofenac Sodium] Rash and Other (See Comments)    Burning and peeling at the sight of application.    Level of Care/Admitting Diagnosis ED Disposition     ED Disposition  Admit   Condition  --   Comment  Hospital Area: [place] [[id number]]  Level of Care: Telemetry [5]  Admit to tele based on following criteria: Other see comments  Comments: Paroxysmal Afib  May admit patient to [facility] or [facility] if equivalent level of care is available:: No  Covid Evaluation: Asymptomatic - no recent exposure (last 10 days) testing not required  Diagnosis: Hyponatremia [198519]  Admitting Physician: Reubin Milan [id number]  Attending Physician: Reubin Milan [id number]  Certification:: I certify this patient will need inpatient services for at least 2 midnights  Estimated Length of Stay: 2          B Medical/Surgery History Past Medical History:  Diagnosis Date   Alcohol abuse    last use 03/09/21, marijuana last 03/09/21   Allergy    Anxiety    Cataract 06/09/2012   Right eye and left eye   COPD (chronic obstructive pulmonary disease) ([place])     Depression    Drug withdrawal seizure with complication ([place]) [id number]   Due to benzodiazepine withdrawal   Dyspnea    uses oxygen 2L via [place] prn   Enterococcal bacteremia 02/10/2022   GERD (gastroesophageal reflux disease)    Headache    Hypertension    Long term (current) use of anticoagulants    Nausea vomiting and diarrhea 02/09/2022   Neuromuscular disorder ([place])    Neuropathy    Positive blood culture 02/10/2022   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   Seizure ([place])    08/2020 per patient   Seizures ([place])    xanax withdrawl- 05/31/12   Urinary incontinence 06/09/2012   Past Surgical History:  Procedure Laterality Date   APPENDECTOMY     BIOPSY  01/04/2021   Procedure: BIOPSY;  Surgeon: Yetta Flock, MD;  Location: [facility];  Service: Gastroenterology;;   BIOPSY  03/12/2021   Procedure: BIOPSY;  Surgeon: Irving Copas., MD;  Location: [facility];  Service: Gastroenterology;;   CARDIOVERSION N/A 01/28/2022   Procedure: CARDIOVERSION;  Surgeon: Adrian Prows, MD;  Location: [facility];  Service: Cardiovascular;  Laterality: N/A;   CATARACT EXTRACTION  06/09/2012   Left eye   COLONOSCOPY WITH PROPOFOL N/A 03/12/2021   Procedure: COLONOSCOPY WITH PROPOFOL;  Surgeon: Irving Copas., MD;  Location: [facility];  Service: Gastroenterology;  Laterality: N/A;   ESOPHAGOGASTRODUODENOSCOPY (EGD) WITH  PROPOFOL N/A 01/04/2021   Procedure: ESOPHAGOGASTRODUODENOSCOPY (EGD) WITH PROPOFOL;  Surgeon: Yetta Flock, MD;  Location: [facility];  Service: Gastroenterology;  Laterality: N/A;   left shoulder dislocation  Sept 2011   POLYPECTOMY  03/12/2021   Procedure: POLYPECTOMY;  Surgeon: Mansouraty, Telford Nab., MD;  Location: [facility];  Service: Gastroenterology;;     A IV Location/Drains/Wounds Patient Lines/Drains/Airways Status     Active Line/Drains/Airways     Name Placement date Placement time Site Days    Peripheral IV 07/27/22 22 G Left;Posterior Hand 07/27/22  1131  Hand  less than 1   Wound / Incision (Open or Dehisced) 11/28/21 Irritant Dermatitis (Moisture Associated Skin Damage) Groin Bilateral 11/28/21  2000  Groin  241   Wound / Incision (Open or Dehisced) 01/02/22 Irritant Dermatitis (Moisture Associated Skin Damage) Perineum Anterior;Posterior;Medial redness extending on perineum and buttocks 01/02/22  1400  Perineum  206            Intake/Output Last 24 hours  Intake/Output Summary (Last 24 hours) at 07/27/2022 1521 Last data filed at 07/27/2022 1330 Gross per 24 hour  Intake 1170 ml  Output 425 ml  Net 745 ml    Labs/Imaging Results for orders placed or performed during the hospital encounter of 07/26/22 (from the past 48 hour(s))  Resp panel by RT-PCR (RSV, Flu A&B, Covid) Anterior Nasal Swab     Status: None   Collection Time: 07/26/22  3:18 AM   Specimen: Anterior Nasal Swab  Result Value Ref Range   SARS Coronavirus 2 by RT PCR NEGATIVE NEGATIVE    Comment: (NOTE) SARS-CoV-2 target nucleic acids are NOT DETECTED.  The SARS-CoV-2 RNA is generally detectable in upper respiratory specimens during the acute phase of infection. The lowest concentration of SARS-CoV-2 viral copies this assay can detect is 138 copies/mL. A negative result does not preclude SARS-Cov-2 infection and should not be used as the sole basis for treatment or other patient management decisions. A negative result may occur with  improper specimen collection/handling, submission of specimen other than nasopharyngeal swab, presence of viral mutation(s) within the areas targeted by this assay, and inadequate number of viral copies(<138 copies/mL). A negative result must be combined with clinical observations, patient history, and epidemiological information. The expected result is Negative.  Fact Sheet for Patients:  EntrepreneurPulse.com.au  Fact Sheet for Healthcare Providers:   IncredibleEmployment.be  This test is no t yet approved or cleared by the [place] FDA and  has been authorized for detection and/or diagnosis of SARS-CoV-2 by FDA under an Emergency Use Authorization (EUA). This EUA will remain  in effect (meaning this test can be used) for the duration of the COVID-19 declaration under Section 564(b)(1) of the Act, 21 U.S.C.section 360bbb-3(b)(1), unless the authorization is terminated  or revoked sooner.       Influenza A by PCR NEGATIVE NEGATIVE   Influenza B by PCR NEGATIVE NEGATIVE    Comment: (NOTE) The Xpert Xpress SARS-CoV-2/FLU/RSV plus assay is intended as an aid in the diagnosis of influenza from Nasopharyngeal swab specimens and should not be used as a sole basis for treatment. Nasal washings and aspirates are unacceptable for Xpert Xpress SARS-CoV-2/FLU/RSV testing.  Fact Sheet for Patients: EntrepreneurPulse.com.au  Fact Sheet for Healthcare Providers: IncredibleEmployment.be  This test is not yet approved or cleared by the [place] FDA and has been authorized for detection and/or diagnosis of SARS-CoV-2 by FDA under an Emergency Use Authorization (EUA). This EUA will remain in effect (meaning this test  can be used) for the duration of the COVID-19 declaration under Section 564(b)(1) of the Act, 21 U.S.C. section 360bbb-3(b)(1), unless the authorization is terminated or revoked.     Resp Syncytial Virus by PCR NEGATIVE NEGATIVE    Comment: (NOTE) Fact Sheet for Patients: EntrepreneurPulse.com.au  Fact Sheet for Healthcare Providers: IncredibleEmployment.be  This test is not yet approved or cleared by the [place] FDA and has been authorized for detection and/or diagnosis of SARS-CoV-2 by FDA under an Emergency Use Authorization (EUA). This EUA will remain in effect (meaning this test can be used) for the duration of  the COVID-19 declaration under Section 564(b)(1) of the Act, 21 U.S.C. section 360bbb-3(b)(1), unless the authorization is terminated or revoked.  Performed at [facility], [place] [address] [place], [place] [address]   CBC with Differential     Status: Abnormal   Collection Time: 07/26/22  4:26 AM  Result Value Ref Range   WBC 11.1 (H) 4.0 - 10.5 K/uL   RBC 4.60 3.87 - 5.11 MIL/uL   Hemoglobin 9.5 (L) 12.0 - 15.0 g/dL   HCT 30.8 (L) 36.0 - 46.0 %   MCV 67.0 (L) 80.0 - 100.0 fL   MCH 20.7 (L) 26.0 - 34.0 pg   MCHC 30.8 30.0 - 36.0 g/dL   RDW 24.9 (H) 11.5 - 15.5 %   Platelets 313 150 - 400 K/uL   nRBC 0.0 0.0 - 0.2 %   Neutrophils Relative % 84 %   Neutro Abs 9.3 (H) 1.7 - 7.7 K/uL   Lymphocytes Relative 10 %   Lymphs Abs 1.1 0.7 - 4.0 K/uL   Monocytes Relative 6 %   Monocytes Absolute 0.7 0.1 - 1.0 K/uL   Eosinophils Relative 0 %   Eosinophils Absolute 0.0 0.0 - 0.5 K/uL   Basophils Relative 0 %   Basophils Absolute 0.0 0.0 - 0.1 K/uL   Immature Granulocytes 0 %   Abs Immature Granulocytes 0.02 0.00 - 0.07 K/uL   Target Cells PRESENT    Ovalocytes PRESENT     Comment: Performed at [facility], [place] [address] [place], [place] [address]  Comprehensive metabolic panel     Status: Abnormal   Collection Time: 07/26/22  4:26 AM  Result Value Ref Range   Sodium 129 (L) 135 - 145 mmol/L   Potassium 3.6 3.5 - 5.1 mmol/L   Chloride 92 (L) 98 - 111 mmol/L   CO2 26 22 - 32 mmol/L   Glucose, Bld 112 (H) 70 - 99 mg/dL    Comment: Glucose reference range applies only to samples taken after fasting for at least 8 hours.   BUN 12 8 - 23 mg/dL   Creatinine, Ser 0.81 0.44 - 1.00 mg/dL   Calcium 9.7 8.9 - 10.3 mg/dL   Total Protein 7.3 6.5 - 8.1 g/dL   Albumin 4.6 3.5 - 5.0 g/dL   AST 20 15 - 41 U/L   ALT 14 0 - 44 U/L   Alkaline Phosphatase 85 38 - 126 U/L   Total Bilirubin 0.5 0.3 - 1.2 mg/dL   GFR, Estimated >60 >60 mL/min    Comment:  (NOTE) Calculated using the CKD-EPI Creatinine Equation (2021)    Anion gap 11 5 - 15    Comment: Performed at [facility], [place] [address], [place], [place] [address]  Ethanol     Status: None   Collection Time: 07/26/22  4:26 AM  Result Value Ref Range  Alcohol, Ethyl (B) <10 <10 mg/dL    Comment: (NOTE) Lowest detectable limit for serum alcohol is 10 mg/dL.  For medical purposes only. Performed at [facility], [place] [address], [place], [place] [id number]   Salicylate level     Status: Abnormal   Collection Time: 07/26/22  4:26 AM  Result Value Ref Range   Salicylate Lvl [id number] (L) 7.0 - 30.0 mg/dL    Comment: Performed at [facility], [place] [address], [place], [place] [address]  Acetaminophen level     Status: Abnormal   Collection Time: 07/26/22  4:26 AM  Result Value Ref Range   Acetaminophen (Tylenol), Serum <10 (L) 10 - 30 ug/mL    Comment: (NOTE) Therapeutic concentrations vary significantly. A range of 10-30 ug/mL  may be an effective concentration for many patients. However, some  are best treated at concentrations outside of this range. Acetaminophen concentrations >150 ug/mL at 4 hours after ingestion  and >50 ug/mL at 12 hours after ingestion are often associated with  toxic reactions.  Performed at [facility], [place] [address], [place], [place] [address]   Rapid urine drug screen (hospital performed)     Status: Abnormal   Collection Time: 07/26/22 12:47 PM  Result Value Ref Range   Opiates NONE DETECTED NONE DETECTED   Cocaine NONE DETECTED NONE DETECTED   Benzodiazepines NONE DETECTED NONE DETECTED   Amphetamines POSITIVE (A) NONE DETECTED   Tetrahydrocannabinol NONE DETECTED NONE DETECTED   Barbiturates NONE DETECTED NONE DETECTED    Comment: (NOTE) DRUG SCREEN FOR MEDICAL PURPOSES ONLY.  IF CONFIRMATION IS NEEDED FOR ANY PURPOSE, NOTIFY LAB WITHIN 5 DAYS.  LOWEST DETECTABLE  LIMITS FOR URINE DRUG SCREEN Drug Class                     Cutoff (ng/mL) Amphetamine and metabolites    1000 Barbiturate and metabolites    200 Benzodiazepine                 200 Opiates and metabolites        300 Cocaine and metabolites        300 THC                            50 Performed at [facility], [place] [address], [place], [place] [address]   Urinalysis, Routine w reflex microscopic -Urine, Clean Catch     Status: Abnormal   Collection Time: 07/26/22 12:47 PM  Result Value Ref Range   Color, Urine YELLOW YELLOW   APPearance TURBID (A) CLEAR   Specific Gravity, Urine 1.013 1.005 - 1.030   pH 7.0 5.0 - 8.0   Glucose, UA NEGATIVE NEGATIVE mg/dL   Hgb urine dipstick SMALL (A) NEGATIVE   Bilirubin Urine NEGATIVE NEGATIVE   Ketones, ur NEGATIVE NEGATIVE mg/dL   Protein, ur 30 (A) NEGATIVE mg/dL   Nitrite NEGATIVE NEGATIVE   Leukocytes,Ua MODERATE (A) NEGATIVE   RBC / HPF 0-5 0 - 5 RBC/hpf   WBC, UA 0-5 0 - 5 WBC/hpf   Bacteria, UA NONE SEEN NONE SEEN   Squamous Epithelial / HPF 0-5 0 - 5 /HPF    Comment: Performed at [facility], [place] [address] [place], [place] [id number]  Basic metabolic panel     Status: Abnormal   Collection Time: 07/27/22  8:38 AM  Result Value Ref Range   Sodium  122 (L) 135 - 145 mmol/L    Comment: DELTA CHECK NOTED   Potassium 3.6 3.5 - 5.1 mmol/L   Chloride 88 (L) 98 - 111 mmol/L   CO2 24 22 - 32 mmol/L   Glucose, Bld 105 (H) 70 - 99 mg/dL    Comment: Glucose reference range applies only to samples taken after fasting for at least 8 hours.   BUN 6 (L) 8 - 23 mg/dL   Creatinine, Ser 0.55 0.44 - 1.00 mg/dL   Calcium 8.7 (L) 8.9 - 10.3 mg/dL   GFR, Estimated >60 >60 mL/min    Comment: (NOTE) Calculated using the CKD-EPI Creatinine Equation (2021)    Anion gap 10 5 - 15    Comment: Performed at [facility], [place] [address] [place], [place] [id number]  Basic metabolic panel     Status:  Abnormal   Collection Time: 07/27/22  2:16 PM  Result Value Ref Range   Sodium 124 (L) 135 - 145 mmol/L   Potassium 3.9 3.5 - 5.1 mmol/L   Chloride 91 (L) 98 - 111 mmol/L   CO2 23 22 - 32 mmol/L   Glucose, Bld 102 (H) 70 - 99 mg/dL    Comment: Glucose reference range applies only to samples taken after fasting for at least 8 hours.   BUN 6 (L) 8 - 23 mg/dL   Creatinine, Ser 0.57 0.44 - 1.00 mg/dL   Calcium 8.5 (L) 8.9 - 10.3 mg/dL   GFR, Estimated >60 >60 mL/min    Comment: (NOTE) Calculated using the CKD-EPI Creatinine Equation (2021)    Anion gap 10 5 - 15    Comment: Performed at [facility], [place] [address], [place], [place] [address]   DG Chest 1 View  Result Date: 07/26/2022 CLINICAL DATA:  Patient reports feeling as if she is going to have a mental breakdown. EXAM: CHEST  1 VIEW COMPARISON:  06/24/2022 FINDINGS: Heart size and mediastinal contours are unremarkable. There is no pleural effusion or edema. No airspace opacities identified. Visualized osseous structures are unremarkable. IMPRESSION: No active disease. Electronically Signed   By: Kerby Moors M.D.   On: 07/26/2022 04:40    Pending Labs Unresulted Labs (From admission, onward)     Start     Ordered   07/28/22 0500  Comprehensive metabolic panel  Tomorrow morning,   R        07/27/22 1512   07/28/22 0500  CBC  Tomorrow morning,   R        07/27/22 1512   07/27/22 1459  C Difficile Quick Screen w PCR reflex  (C Difficile quick screen w PCR reflex panel )  Once, for 24 hours,   TIMED       References:    CDiff Information Tool   07/27/22 1501            Vitals/Pain Today's Vitals   07/27/22 0820 07/27/22 0924 07/27/22 0934 07/27/22 1259  BP:  125/81    Pulse:  90 87   Resp:  19    Temp:  (!) 97.3 F (36.3 C)    TempSrc:  Oral    SpO2:  (!) 89% 93%   Weight:      Height:      PainSc: 0-No pain   0-No pain    Isolation Precautions Enteric precautions (UV  disinfection)  Medications Medications  amiodarone (PACERONE) tablet 200 mg (200 mg Oral Given 07/27/22 0926)  apixaban (ELIQUIS) tablet 5  mg (5 mg Oral Given 07/27/22 0926)  gabapentin (NEURONTIN) capsule 800 mg (800 mg Oral Given 07/27/22 0925)  nicotine (NICODERM CQ - dosed in mg/24 hours) patch 14 mg (14 mg Transdermal Patch Applied 07/27/22 0924)  benzocaine (ORAJEL) 10 % mucosal gel ( Mouth/Throat Given 07/27/22 1214)  0.9 %  sodium chloride infusion ( Intravenous New Bag/Given 07/27/22 1508)  acetaminophen (TYLENOL) tablet 650 mg (has no administration in time range)    Or  acetaminophen (TYLENOL) suppository 650 mg (has no administration in time range)  ondansetron (ZOFRAN) tablet 4 mg (has no administration in time range)    Or  ondansetron (ZOFRAN) injection 4 mg (has no administration in time range)  0.9 %  sodium chloride infusion ( Intravenous Not Given 07/27/22 1518)  nicotine (NICODERM CQ - dosed in mg/24 hr) patch 7 mg (7 mg Transdermal Patch Removed 07/27/22 1421)  gabapentin (NEURONTIN) capsule 200 mg (200 mg Oral Given 07/26/22 1618)  acetaminophen (TYLENOL) tablet 650 mg (650 mg Oral Given 07/26/22 1912)  sodium chloride 0.9 % bolus 1,000 mL (0 mLs Intravenous Stopped 07/27/22 1233)  acetaminophen (TYLENOL) tablet 1,000 mg (1,000 mg Oral Given 07/27/22 1214)    Mobility walks with device     Focused Assessments Psychiatric   R Recommendations: See Admitting Provider Note  Report given to:   Additional Notes: n/a

## 2022-07-27 NOTE — H&P (Signed)
History and Physical    Patient: Karen Casey [id number] DOB: [birth year] DOA: 07/26/2022 DOS: the patient was seen and examined on 07/27/2022 PCP: [name], MD  Patient coming from: Home  Chief Complaint:  Chief Complaint  Patient presents with   Medical Clearance   HPI: Karen Casey is a 67 y.o. female with medical history significant of alcohol abuse, seasonal allergies, anxiety, depression, cataracts, COPD, history of benzodiazepine abuse and withdrawal, benzodiazepine withdrawal seizure, dyspnea, enterococcal bacteremia, ruptured appendix, GERD, headaches, hypertension, nausea, vomiting and diarrhea, neuropathy, urinary incontinence who came into the emergency department stating that she was having a "mental breakdown" after her husband died in 06-21-23.  EMS described that her house is full of feces.  She has been cleared by psychiatry.  She is supposed to move in with her daughter in March, in the meantime her sister will be staying with her according to Dr. Maryan Rued.  However, the patient developed hyponatremia while in the emergency department.  When I was examining her, she had multiple containers with water and hypotonic fluids.  She stated that she was drinking because her mouth felt dry and her tongue hurts.  She denied fever, chills, rhinorrhea, sore throat, wheezing or hemoptysis.  No chest pain, palpitations, diaphoresis, PND, orthopnea or pitting edema of the lower extremities.  No abdominal pain, nausea, emesis, diarrhea, constipation, melena or hematochezia.  No flank pain, dysuria, frequency or hematuria.   Lab work: Urinalysis showed moderate leukocyte esterase, small hemoglobinuria and ketonuria 30 mg/dL.  Unremarkable microscopic examination.  CBC showed a white count of 11.1 with 84% neutrophils, hemoglobin 9.5 g/dL platelets 313.  Coronavirus, influenza and RSV PCR was negative.  Normal alcohol, salicylate and acetaminophen levels.  CMP showed a sodium 129,  potassium 3.6, chloride 92 and CO2 26 mmol/L.  Glucose was 112 mg/dL.  The rest of the electrolytes, renal function and LFTs were normal.  Her sodium decreased to 122 and most recently is 124 mmol/L.  BUN is down to 6 mg/dL.  Imaging: Portable 1 view chest radiograph with no active disease.  ED course: Initial vital signs were temperature 97.8 F, pulse 84, respiration 18, blood pressure 88/60 mmHg O2 sat 94% on room air.  The patient received acetaminophen 650 mg x 1 yesterday and 1000 mg p.o. today, Neurontin 200 mg p.o. NS 1000 mL bolus and a 40 mg NicoDerm patch.   Review of Systems: As mentioned in the history of present illness. All other systems reviewed and are negative. Past Medical History:  Diagnosis Date   Alcohol abuse    last use 03/09/21, marijuana last 03/09/21   Allergy    Anxiety    Cataract 06/09/2012   Right eye and left eye   COPD (chronic obstructive pulmonary disease) ([place])    Depression    Drug withdrawal seizure with complication ([place]) [id number]   Due to benzodiazepine withdrawal   Dyspnea    uses oxygen 2L via [facility] prn   Enterococcal bacteremia 02/10/2022   GERD (gastroesophageal reflux disease)    Headache    Hypertension    Long term (current) use of anticoagulants    Nausea vomiting and diarrhea 02/09/2022   Neuromuscular disorder ([place])    Neuropathy    Positive blood culture 02/10/2022   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   Seizure ([place])    08/2020 per patient   Seizures ([place])    xanax withdrawl- 2012/06/20   Urinary incontinence 06/09/2012  Past Surgical History:  Procedure Laterality Date   APPENDECTOMY     BIOPSY  01/04/2021   Procedure: BIOPSY;  Surgeon: Yetta Flock, MD;  Location: [place];  Service: Gastroenterology;;   BIOPSY  03/12/2021   Procedure: BIOPSY;  Surgeon: Irving Copas., MD;  Location: [place];  Service: Gastroenterology;;   CARDIOVERSION N/A 01/28/2022   Procedure:  CARDIOVERSION;  Surgeon: Adrian Prows, MD;  Location: [place];  Service: Cardiovascular;  Laterality: N/A;   CATARACT EXTRACTION  06/09/2012   Left eye   COLONOSCOPY WITH PROPOFOL N/A 03/12/2021   Procedure: COLONOSCOPY WITH PROPOFOL;  Surgeon: Irving Copas., MD;  Location: [place];  Service: Gastroenterology;  Laterality: N/A;   ESOPHAGOGASTRODUODENOSCOPY (EGD) WITH PROPOFOL N/A 01/04/2021   Procedure: ESOPHAGOGASTRODUODENOSCOPY (EGD) WITH PROPOFOL;  Surgeon: Yetta Flock, MD;  Location: [place];  Service: Gastroenterology;  Laterality: N/A;   left shoulder dislocation  Sept 2011   POLYPECTOMY  03/12/2021   Procedure: POLYPECTOMY;  Surgeon: Mansouraty, Telford Nab., MD;  Location: [place];  Service: Gastroenterology;;   Social History:  reports that she has been smoking cigarettes. She has a 21.50 pack-year smoking history. She has never used smokeless tobacco. She reports current alcohol use of about 3.0 standard drinks of alcohol per week. She reports current drug use. Drugs: Marijuana and Other-see comments.  Allergies  Allergen Reactions   Capsaicin-Menthol Rash and Other (See Comments)    Burning and peeling on area applied to   Diclo Gel [Diclofenac Sodium] Rash and Other (See Comments)    Burning and peeling at the sight of application.    Family History  Problem Relation Age of Onset   Hypertension Mother    Hyperlipidemia Mother    Aneurysm Mother        Rupture - Cause of death   Heart disease Father        MI - cause of death   Depression Father    Parkinsonism Father    Hypertension Sister    Colon cancer Neg Hx    Esophageal cancer Neg Hx    Rectal cancer Neg Hx    Stomach cancer Neg Hx     Prior to Admission medications   Medication Sig Start Date End Date Taking? Authorizing Provider  amiodarone (PACERONE) 200 MG tablet Take 1 tablet (200 mg total) by mouth daily. 03/04/22  Yes Hensel, Jamal Collin, MD  gabapentin (NEURONTIN) 800 MG  tablet Take 1 tablet (800 mg total) by mouth 3 (three) times daily. 06/11/22  Yes Hensel, Jamal Collin, MD  hydrOXYzine (ATARAX) 10 MG tablet TAKE 1 TABLET BY MOUTH 3 TIMES DAILY AS NEEDED FOR ANXIETY. 04/02/22  Yes Pray, Norwood Levo, MD  nicotine (NICODERM CQ - DOSED IN MG/24 HOURS) 14 mg/24hr patch Place 14 mg onto the skin daily.   Yes [provider]  QUEtiapine (SEROQUEL) 200 MG tablet TAKE 1 TABLET BY MOUTH AT BEDTIME. 07/05/22  Yes Dickie La, MD  albuterol (VENTOLIN HFA) 108 (90 Base) MCG/ACT inhaler Inhale 2 puffs into the lungs every 6 (six) hours as needed for wheezing or shortness of breath. Patient not taking: Reported on 07/26/2022 09/23/21   [name], MD  apixaban (ELIQUIS) 5 MG TABS tablet Take 1 tablet (5 mg total) by mouth 2 (two) times daily. Patient not taking: Reported on 06/11/2022 03/04/22   Zenia Resides, MD  busPIRone (BUSPAR) 10 MG tablet Take 10 mg by mouth 3 (three) times daily as needed (anxiety). Patient not  taking: Reported on 07/26/2022    [provider]  Fluticasone-Umeclidin-Vilant (TRELEGY ELLIPTA) 100-62.5-25 MCG/ACT AEPB Inhale 2 puffs into the lungs daily. Patient not taking: Reported on 06/11/2022    [provider]    Physical Exam: Vitals:   07/26/22 1749 07/27/22 0452 07/27/22 0924 07/27/22 0934  BP: 115/73 (!) 155/84 125/81   Pulse: 80 77 90 87  Resp: 16 20 19   $ Temp: 98.6 F (37 C) 97.9 F (36.6 C) (!) 97.3 F (36.3 C)   TempSrc: Oral Oral Oral   SpO2: 94% 92% (!) 89% 93%  Weight:      Height:       Physical Exam Vitals and nursing note reviewed.  Constitutional:      Appearance: Normal appearance.  HENT:     Head: Normocephalic.     Nose: No rhinorrhea.     Mouth/Throat:     Mouth: Mucous membranes are moist.  Eyes:     General: No scleral icterus.    Pupils: Pupils are equal, round, and reactive to light.  Neck:     Vascular: No JVD.  Cardiovascular:     Rate and Rhythm: Normal rate and regular  rhythm.     Heart sounds: S1 normal and S2 normal.  Pulmonary:     Effort: Pulmonary effort is normal.     Breath sounds: Wheezing present. No rhonchi or rales.  Abdominal:     General: Bowel sounds are normal. There is no distension.     Palpations: Abdomen is soft.     Tenderness: There is no abdominal tenderness. There is no guarding.  Musculoskeletal:     Cervical back: Neck supple.     Right lower leg: No edema.     Left lower leg: No edema.  Skin:    General: Skin is warm and dry.  Neurological:     General: No focal deficit present.     Mental Status: She is alert and oriented to person, place, and time.  Psychiatric:        Mood and Affect: Mood is anxious.        Speech: Speech is tangential.        Behavior: Behavior is cooperative.   Data Reviewed:  Results are pending, will review when available.  Echocardiogram 01/06/2022: 1. Left ventricular ejection fraction by 3D volume is 63 %. The left ventricle has normal function. The left ventricle has no regional wall motion abnormalities. Left ventricular diastolic parameters are indeterminate.  2. Right ventricular systolic function is normal. The right ventricular size is normal. There is normal pulmonary artery systolic pressure. The estimated right ventricular systolic pressure is [id number] mmHg.  3. Left atrial size was mildly dilated.  4. The mitral valve is normal in structure. Mild mitral valve regurgitation. No evidence of mitral stenosis.  5. The aortic valve is tricuspid. There is mild calcification of the aortic valve. Aortic valve regurgitation is not visualized. No aortic stenosis is present.  6. The inferior vena cava is normal in size with greater than 50% respiratory variability, suggesting right atrial pressure of 3 mmHg.  7. Rhythm strip during this exam demonstrates premature atrial contractions.  Assessment and Plan: Principal Problem:   Hyponatremia Likely due to water  potomania. Telemetry/inpatient. Fluid restriction. Normal saline infusion. Monitor intake and output. Follow-up sodium level this p.m. and in AM. SSRI has been held.  Active Problems:   Severe episode of recurrent MDD,    without psychotic features (HCC) Sertraline has been  held. Continue hydroxyzine for anxiety. Continue Seroquel 200 mg p.o. at bedtime.    Tobacco abuse Tobacco cessation advised. Continue daily NicoDerm patch.    HTN (hypertension) Currently not on antihypertensives. Will benefit from smoking cessation. As needed antihypertensives while in the hospital.    COPD (chronic obstructive pulmonary disease) ([place]) Continue Trelegy Ellipta or formulary equivalent. DuoNeb every 4 hours as needed.    Paroxysmal atrial fibrillation (HCC) CHA2DS2-VASc score of 3. Female gender, HTN and aortic atherosclerosis Continue amiodarone 200 mg p.o. daily. Continue apixaban 5 mg p.o. twice daily.    Alcohol use In remission according to the patient. Monitor for withdrawal symptoms.    GERD (gastroesophageal reflux disease) Asymptomatic at this time.    Advance Care Planning:   Code Status: Full Code   Consults: Psychiatry has cleared the patient.  Family Communication:   Severity of Illness: The appropriate patient status for this patient is INPATIENT. Inpatient status is judged to be reasonable and necessary in order to provide the required intensity of service to ensure the patient's safety. The patient's presenting symptoms, physical exam findings, and initial radiographic and laboratory data in the context of their chronic comorbidities is felt to place them at high risk for further clinical deterioration. Furthermore, it is not anticipated that the patient will be medically stable for discharge from the hospital within 2 midnights of admission.   * I certify that at the point of admission it is my clinical judgment that the patient will require inpatient hospital  care spanning beyond 2 midnights from the point of admission due to high intensity of service, high risk for further deterioration and high frequency of surveillance required.*  Author: Reubin Milan, MD 07/27/2022 3:01 PM  For on call review [email or web address].   This document was prepared using Dragon voice recognition software and may contain some unintended transcription errors.

## 2022-07-27 NOTE — ED Provider Notes (Addendum)
Emergency Medicine Observation Re-evaluation Note  Karen Casey is a 67 y.o. female, seen on rounds today.  Pt initially presented to the ED for complaints of Medical Clearance Currently, the patient is patient is well-appearing this morning sitting in bed reports she has no complaints.  She did want me to look at her tongue because she had some sores on her tongue but otherwise states that she is ready to go home.  Physical Exam  BP (!) 155/84 (BP Location: Right Arm)   Pulse 77   Temp 97.9 F (36.6 C) (Oral)   Resp 20   Ht 5' 5"$  (1.651 m)   Wt 60.8 kg   SpO2 92%   BMI 22.30 kg/m  Physical Exam General: No acute distress, thin well-developed female.  Healing laceration to the tongue Cardiac: Regular rate and rhythm Lungs: Clear to auscultation Psych: Calm and cooperative.  Denies any SI at this time.  Denies feeling significantly anxious  ED Course / MDM  EKG:EKG Interpretation  Date/Time:  Monday July 26 2022 04:16:44 EST Ventricular Rate:  70 PR Interval:  169 QRS Duration: 84 QT Interval:  426 QTC Calculation: 460 R Axis:   32 Text Interpretation: Sinus rhythm Probable left atrial enlargement Minimal ST elevation, inferior leads No significant change was found Confirmed by Shanon Rosser [id number]) on 07/26/2022 4:38:32 AM  I have reviewed the labs performed to date as well as medications administered while in observation.  Recent changes in the last 24 hours include no recent changes.  Plan  Current plan is for psychiatry evaluated patient yesterday and felt that she met inpatient criteria.  Will reevaluate today.  Patient was found to have hyponatremia of 129 on BMP yesterday and anemic with a hemoglobin of 9.6.  The hemoglobin of 9.6 is unchanged over the last 6 months and is most likely her baseline.  Hyponatremia is slightly worse but she has been hyponatremic in the past.  Most likely related to alcohol use.  Will repeat this morning but patient is otherwise  well-appearing and is not displaying any signs of withdrawal.   Repeat BMP showed drop of sodium from 129 to 122.  Will restrict water and give a bolus of fluids.  Will recheck.   2:47 PM Pt sodium is slightly improved to 124 from 122.  However unclear cause of hyponatremia if it is related to nutritional problems or her zoloft.  Will admit for correction of hyponatremia pt is psych cleared and pt's sister is going to come live with her and take her to appointments.   Blanchie Dessert, MD 07/27/22 [id number]    Blanchie Dessert, MD 07/27/22 1447    Blanchie Dessert, MD 07/27/22 [phone]

## 2022-07-27 NOTE — ED Notes (Signed)
Patient alert,   Patient medication compliant. Patient anxious. Patient disorganized. Redirectable.

## 2022-07-27 NOTE — Discharge Instructions (Signed)
[place] were in the hospital because of low sodium. This improved with decreasing how much water you intake and some salt water. Please limit your water intake to less than 1,500 mL (1.5 liters). Please follow-up with your PCP.  Discharge recommendations:  Patient is to take medications as prescribed. Please see information for follow-up appointment with psychiatry and therapy. Please follow up with your primary care provider for all medical related needs.   Therapy: We recommend that patient participate in individual therapy to address mental health concerns.  Medications: The patient or guardian is to contact a medical professional and/or outpatient provider to address any new side effects that develop. The patient or guardian should update outpatient providers of any new medications and/or medication changes.   Atypical antipsychotics: If you are prescribed an atypical antipsychotic, it is recommended that your height, weight, BMI, blood pressure, fasting lipid panel, and fasting blood sugar be monitored by your outpatient providers.  Safety:  The patient should abstain from use of illicit substances/drugs and abuse of any medications. If symptoms worsen or do not continue to improve or if the patient becomes actively suicidal or homicidal then it is recommended that the patient return to the closest hospital emergency department, the [facility], or call 911 for further evaluation and treatment. National Suicide Prevention Lifeline 1-800-SUICIDE or 514-095-5049.  About 988 988 offers 24/7 access to trained crisis counselors who can help people experiencing mental health-related distress. People can call or text 988 or chat 988lifeline.org for themselves or if they are worried about a loved one who may need crisis support.  Crisis Mobile: [facility]:                     905-695-4556 (for crisis response 24 hours a day) [facility] Hotline:                                            (240)209-9650   Safety Plan TORRIN URATA will reach out to sister Charline Bills, or call 911 or call mobile crisis, or go to nearest emergency room if condition worsens or if suicidal thoughts become active Patients' will follow up with [facility]) for outpatient psychiatric services (therapy/medication management).  The suicide prevention education provided includes the following: Suicide risk factors Suicide prevention and interventions National Suicide Hotline telephone number [facility] assessment telephone number [facility] Emergency Assistance [place] and/or Residential Mobile Crisis Unit telephone number Request made of family/significant other to:  Charline Bills Remove weapons (e.g., guns, rifles, knives), all items previously/currently identified as safety concern.   Remove drugs/medications (over the counter, prescriptions, illicit drugs), all items previously/currently identified as a safety concern.

## 2022-07-27 NOTE — ED Notes (Signed)
Report received from Junior RN, assumed care of patient at this time, patient resting in bed with eyes closed, NADN, even and unlabored respirations noted. Sitter at bedside.

## 2022-07-27 NOTE — Progress Notes (Cosign Needed Addendum)
[name] Psych ED Progress Note  07/27/2022 10:58 AM Karen Casey  MRN:  [id number]   Principal Problem: Severe episode of recurrent major depressive disorder, without psychotic features ([place]) Diagnosis:  Principal Problem:   Severe episode of recurrent major depressive disorder, without psychotic features [facility] ([place]   ED Assessment Time Calculation: Start Time: 0945 Stop Time: 1000 Total Time in Minutes (Assessment Completion): 15   Subjective:  Karen Casey, 67 y.o., female patient seen face to face by this provider, consulted with Dr. Dwyane Dee; and chart reviewed on 07/27/22.  On evaluation Karen Casey is laying in her bed, and informs provider that she is here for grief and depression, stated that her husband passed away on 05/26/2022 from a heart attack, and she has been sad.  Provider asked patient where she got amphetamines from, UDS was positive for amphetamines, patient stated that she got them from someone and she took 2 mg Adderall.  Patient says that she has been clean from alcohol for 3 years, and clean from using Xanax "for a long time ".  Patient denies SI/HI/AVH, says she has grandbabies that she does not want to hurt herself or anybody because of her grandbabies.  Patient says her appetite and sleep are poor, sleep is poor due to being here she cannot sleep in the hospital, and appetite is poor because it looks like patient bit her tongue and she has a small blister on her tongue and is complaining of pain, provider informed EDP.  Patient states she does wear diapers, and she has limited mobility even with her walker. She is calm and cooperative during this assessment.  She is disheveled.  Her eye contact is fair.  Speech is clear and coherent, normal pace and normal volume.  She reports her mood is sad.  Affect is congruent with mood.  Thought process is coherent and logical. She denies auditory and visual hallucinations.  No indication that she is responding to internal  stimuli during this assessment.  No delusions elicited during this assessment.  She denies suicidal ideations.  She denies homicidal ideations.  Patient has complaints of tongue pain, says she has a sore on her tongue which provider noticed, and informed patient she would like the EDP know about it.  Provider informed patient that she will be transferred to a psychiatric inpatient facility, patient verbalizes understanding.   Lab results are reviewed with Sodium 122, which on 07/26/22 was 129, WBC going up.  Discussed with EDP, and she will restrict water intake and give a bolus of fluids and recheck the labs. Patient will be considered for inpatient Psychiatry care when Medically cleared.  If patient is Medically admitted Psychiatry will be involved in her care as consult and will follow her.   Past Psychiatric History: previous Psychiatrist hx of Anxiety, alcoholism, Xanax addiction and depression. Previous Psych hospitalization in 1996 for Xanax addiction and alcoholism. She has no outpatient Psychiatrist and PCP prescribes Hydroxyzine.   [place] Scale:  [place] ED from 07/26/2022 in [facility] Emergency Department at [facility] ED from 06/24/2022 in [facility] Emergency Department at [facility] ED from 02/08/2022 in [facility] Emergency Department at [place] Error: Q6 is Yes, you must answer 7 No Risk No Risk       Past Medical History:  Past Medical History:  Diagnosis Date   Alcohol abuse    last use 03/09/21, marijuana last 03/09/21   Allergy  Anxiety    Cataract 06/09/2012   Right eye and left eye   COPD (chronic obstructive pulmonary disease) ([place])    Depression    Drug withdrawal seizure with complication ([place]) [id number]   Due to benzodiazepine withdrawal   Dyspnea    uses oxygen 2L via [place] prn   Enterococcal bacteremia 02/10/2022   GERD (gastroesophageal reflux disease)    Headache    Hypertension    Long term  (current) use of anticoagulants    Nausea vomiting and diarrhea 02/09/2022   Neuromuscular disorder ([place])    Neuropathy    Positive blood culture 02/10/2022   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   Seizure ([place])    08/2020 per patient   Seizures ([place])    xanax withdrawl- December 2013   Urinary incontinence 06/09/2012    Past Surgical History:  Procedure Laterality Date   APPENDECTOMY     BIOPSY  01/04/2021   Procedure: BIOPSY;  Surgeon: Yetta Flock, MD;  Location: [place];  Service: Gastroenterology;;   BIOPSY  03/12/2021   Procedure: BIOPSY;  Surgeon: Irving Copas., MD;  Location: [place];  Service: Gastroenterology;;   CARDIOVERSION N/A 01/28/2022   Procedure: CARDIOVERSION;  Surgeon: Adrian Prows, MD;  Location: [place];  Service: Cardiovascular;  Laterality: N/A;   CATARACT EXTRACTION  06/09/2012   Left eye   COLONOSCOPY WITH PROPOFOL N/A 03/12/2021   Procedure: COLONOSCOPY WITH PROPOFOL;  Surgeon: Irving Copas., MD;  Location: [place];  Service: Gastroenterology;  Laterality: N/A;   ESOPHAGOGASTRODUODENOSCOPY (EGD) WITH PROPOFOL N/A 01/04/2021   Procedure: ESOPHAGOGASTRODUODENOSCOPY (EGD) WITH PROPOFOL;  Surgeon: Yetta Flock, MD;  Location: [place];  Service: Gastroenterology;  Laterality: N/A;   left shoulder dislocation  Sept 2011   POLYPECTOMY  03/12/2021   Procedure: POLYPECTOMY;  Surgeon: Mansouraty, Telford Nab., MD;  Location: [facility] ENDOSCOPY;  Service: Gastroenterology;;   Family History:  Family History  Problem Relation Age of Onset   Hypertension Mother    Hyperlipidemia Mother    Aneurysm Mother        Rupture - Cause of death   Heart disease Father        MI - cause of death   Depression Father    Parkinsonism Father    Hypertension Sister    Colon cancer Neg Hx    Esophageal cancer Neg Hx    Rectal cancer Neg Hx    Stomach cancer Neg Hx     Social History:  Social History    Substance and Sexual Activity  Alcohol Use Yes   Alcohol/week: 3.0 standard drinks of alcohol   Types: 3 Cans of beer per week   Comment: 1-2 times week just beer     Social History   Substance and Sexual Activity  Drug Use Yes   Types: Marijuana, Other-see comments   Comment: Past hx of benzo abuse--xanax    Social History   Socioeconomic History   Marital status: Single    Spouse name: Not on file   Number of children: 1   Years of education: Not on file   Highest education level: Not on file  Occupational History   Not on file  Tobacco Use   Smoking status: Every Day    Packs/day: 0.50    Years: 43.00    Total pack years: 21.50    Types: Cigarettes   Smokeless tobacco: Never   Tobacco comments:    Tobacco info given  Vaping  Use   Vaping Use: Never used  Substance and Sexual Activity   Alcohol use: Yes    Alcohol/week: 3.0 standard drinks of alcohol    Types: 3 Cans of beer per week    Comment: 1-2 times week just beer   Drug use: Yes    Types: Marijuana, Other-see comments    Comment: Past hx of benzo abuse--xanax   Sexual activity: Not on file  Other Topics Concern   Not on file  Social History Narrative   Not on file   Social Determinants of Health   Financial Resource Strain: Not on file  Food Insecurity: No Food Insecurity (12/25/2021)   Hunger Vital Sign    Worried About Running Out of Food in the Last Year: Never true    Ran Out of Food in the Last Year: Never true  Transportation Needs: Not on file  Physical Activity: Not on file  Stress: Not on file  Social Connections: Not on file    Sleep: Poor  Appetite:  Poor  Current Medications: Current Facility-Administered Medications  Medication Dose Route Frequency Provider Last Rate Last Admin   amiodarone (PACERONE) tablet 200 mg  200 mg Oral Daily Milton Ferguson, MD   200 mg at 07/27/22 0926   apixaban (ELIQUIS) tablet 5 mg  5 mg Oral BID Milton Ferguson, MD   5 mg at 07/27/22 [id number]    gabapentin (NEURONTIN) capsule 800 mg  800 mg Oral TID Milton Ferguson, MD   800 mg at 07/27/22 [id number]   nicotine (NICODERM CQ - dosed in mg/24 hours) patch 14 mg  14 mg Transdermal Daily Polly Cobia, RPH   14 mg at 07/27/22 [id number]   nicotine (NICODERM CQ - dosed in mg/24 hr) patch 7 mg  7 mg Transdermal Once Wynona Dove A, DO   7 mg at 07/26/22 1421   sertraline (ZOLOFT) tablet 25 mg  25 mg Oral Daily Charmaine Downs C, NP   25 mg at 07/27/22 [id number]   Current Outpatient Medications  Medication Sig Dispense Refill   amiodarone (PACERONE) 200 MG tablet Take 1 tablet (200 mg total) by mouth daily. 90 tablet 3   gabapentin (NEURONTIN) 800 MG tablet Take 1 tablet (800 mg total) by mouth 3 (three) times daily. 90 tablet 12   hydrOXYzine (ATARAX) 10 MG tablet TAKE 1 TABLET BY MOUTH 3 TIMES DAILY AS NEEDED FOR ANXIETY. 270 tablet 2   nicotine (NICODERM CQ - DOSED IN MG/24 HOURS) 14 mg/24hr patch Place 14 mg onto the skin daily.     QUEtiapine (SEROQUEL) 200 MG tablet TAKE 1 TABLET BY MOUTH AT BEDTIME. 90 tablet 0   albuterol (VENTOLIN HFA) 108 (90 Base) MCG/ACT inhaler Inhale 2 puffs into the lungs every 6 (six) hours as needed for wheezing or shortness of breath. (Patient not taking: Reported on 07/26/2022) 1 each 2   apixaban (ELIQUIS) 5 MG TABS tablet Take 1 tablet (5 mg total) by mouth 2 (two) times daily. (Patient not taking: Reported on 06/11/2022) 180 tablet 3   busPIRone (BUSPAR) 10 MG tablet Take 10 mg by mouth 3 (three) times daily as needed (anxiety). (Patient not taking: Reported on 07/26/2022)     Fluticasone-Umeclidin-Vilant (TRELEGY ELLIPTA) 100-62.5-25 MCG/ACT AEPB Inhale 2 puffs into the lungs daily. (Patient not taking: Reported on 06/11/2022)      Lab Results:  Results for orders placed or performed during the hospital encounter of 07/26/22 (from the past 48 hour(s))  Resp panel by RT-PCR (RSV, Flu A&B,  Covid) Anterior Nasal Swab     Status: None   Collection Time: 07/26/22  3:18 AM    Specimen: Anterior Nasal Swab  Result Value Ref Range   SARS Coronavirus 2 by RT PCR NEGATIVE NEGATIVE    Comment: (NOTE) SARS-CoV-2 target nucleic acids are NOT DETECTED.  The SARS-CoV-2 RNA is generally detectable in upper respiratory specimens during the acute phase of infection. The lowest concentration of SARS-CoV-2 viral copies this assay can detect is 138 copies/mL. A negative result does not preclude SARS-Cov-2 infection and should not be used as the sole basis for treatment or other patient management decisions. A negative result may occur with  improper specimen collection/handling, submission of specimen other than nasopharyngeal swab, presence of viral mutation(s) within the areas targeted by this assay, and inadequate number of viral copies(<138 copies/mL). A negative result must be combined with clinical observations, patient history, and epidemiological information. The expected result is Negative.  Fact Sheet for Patients:  EntrepreneurPulse.com.au  Fact Sheet for Healthcare Providers:  IncredibleEmployment.be  This test is no t yet approved or cleared by the [place] FDA and  has been authorized for detection and/or diagnosis of SARS-CoV-2 by FDA under an Emergency Use Authorization (EUA). This EUA will remain  in effect (meaning this test can be used) for the duration of the COVID-19 declaration under Section 564(b)(1) of the Act, 21 U.S.C.section 360bbb-3(b)(1), unless the authorization is terminated  or revoked sooner.       Influenza A by PCR NEGATIVE NEGATIVE   Influenza B by PCR NEGATIVE NEGATIVE    Comment: (NOTE) The Xpert Xpress SARS-CoV-2/FLU/RSV plus assay is intended as an aid in the diagnosis of influenza from Nasopharyngeal swab specimens and should not be used as a sole basis for treatment. Nasal washings and aspirates are unacceptable for Xpert Xpress SARS-CoV-2/FLU/RSV testing.  Fact Sheet for  Patients: EntrepreneurPulse.com.au  Fact Sheet for Healthcare Providers: IncredibleEmployment.be  This test is not yet approved or cleared by the [place] FDA and has been authorized for detection and/or diagnosis of SARS-CoV-2 by FDA under an Emergency Use Authorization (EUA). This EUA will remain in effect (meaning this test can be used) for the duration of the COVID-19 declaration under Section 564(b)(1) of the Act, 21 U.S.C. section 360bbb-3(b)(1), unless the authorization is terminated or revoked.     Resp Syncytial Virus by PCR NEGATIVE NEGATIVE    Comment: (NOTE) Fact Sheet for Patients: EntrepreneurPulse.com.au  Fact Sheet for Healthcare Providers: IncredibleEmployment.be  This test is not yet approved or cleared by the [place] FDA and has been authorized for detection and/or diagnosis of SARS-CoV-2 by FDA under an Emergency Use Authorization (EUA). This EUA will remain in effect (meaning this test can be used) for the duration of the COVID-19 declaration under Section 564(b)(1) of the Act, 21 U.S.C. section 360bbb-3(b)(1), unless the authorization is terminated or revoked.  Performed at [facility], [place] [address], [place], [place] [address]   CBC with Differential     Status: Abnormal   Collection Time: 07/26/22  4:26 AM  Result Value Ref Range   WBC 11.1 (H) 4.0 - 10.5 K/uL   RBC 4.60 3.87 - 5.11 MIL/uL   Hemoglobin 9.5 (L) 12.0 - 15.0 g/dL   HCT 30.8 (L) 36.0 - 46.0 %   MCV 67.0 (L) 80.0 - 100.0 fL   MCH 20.7 (L) 26.0 - 34.0 pg   MCHC 30.8 30.0 - 36.0 g/dL   RDW 24.9 (H) 11.5 - 15.5 %  Platelets 313 150 - 400 K/uL   nRBC 0.0 0.0 - 0.2 %   Neutrophils Relative % 84 %   Neutro Abs 9.3 (H) 1.7 - 7.7 K/uL   Lymphocytes Relative 10 %   Lymphs Abs 1.1 0.7 - 4.0 K/uL   Monocytes Relative 6 %   Monocytes Absolute 0.7 0.1 - 1.0 K/uL   Eosinophils Relative 0 %    Eosinophils Absolute 0.0 0.0 - 0.5 K/uL   Basophils Relative 0 %   Basophils Absolute 0.0 0.0 - 0.1 K/uL   Immature Granulocytes 0 %   Abs Immature Granulocytes 0.02 0.00 - 0.07 K/uL   Target Cells PRESENT    Ovalocytes PRESENT     Comment: Performed at [facility], [place] [address], [place], [place] [address]  Comprehensive metabolic panel     Status: Abnormal   Collection Time: 07/26/22  4:26 AM  Result Value Ref Range   Sodium 129 (L) 135 - 145 mmol/L   Potassium 3.6 3.5 - 5.1 mmol/L   Chloride 92 (L) 98 - 111 mmol/L   CO2 26 22 - 32 mmol/L   Glucose, Bld 112 (H) 70 - 99 mg/dL    Comment: Glucose reference range applies only to samples taken after fasting for at least 8 hours.   BUN 12 8 - 23 mg/dL   Creatinine, Ser 0.81 0.44 - 1.00 mg/dL   Calcium 9.7 8.9 - 10.3 mg/dL   Total Protein 7.3 6.5 - 8.1 g/dL   Albumin 4.6 3.5 - 5.0 g/dL   AST 20 15 - 41 U/L   ALT 14 0 - 44 U/L   Alkaline Phosphatase 85 38 - 126 U/L   Total Bilirubin 0.5 0.3 - 1.2 mg/dL   GFR, Estimated >60 >60 mL/min    Comment: (NOTE) Calculated using the CKD-EPI Creatinine Equation (2021)    Anion gap 11 5 - 15    Comment: Performed at [facility], [place] [address], [place], [place] [address]  Ethanol     Status: None   Collection Time: 07/26/22  4:26 AM  Result Value Ref Range   Alcohol, Ethyl (B) <10 <10 mg/dL    Comment: (NOTE) Lowest detectable limit for serum alcohol is 10 mg/dL.  For medical purposes only. Performed at [facility], [place] [address] [place], [place] [id number]   Salicylate level     Status: Abnormal   Collection Time: 07/26/22  4:26 AM  Result Value Ref Range   Salicylate Lvl [id number] (L) 7.0 - 30.0 mg/dL    Comment: Performed at [facility], [place] [address], [place], [place] [address]  Acetaminophen level     Status: Abnormal   Collection Time: 07/26/22  4:26 AM  Result Value Ref Range   Acetaminophen  (Tylenol), Serum <10 (L) 10 - 30 ug/mL    Comment: (NOTE) Therapeutic concentrations vary significantly. A range of 10-30 ug/mL  may be an effective concentration for many patients. However, some  are best treated at concentrations outside of this range. Acetaminophen concentrations >150 ug/mL at 4 hours after ingestion  and >50 ug/mL at 12 hours after ingestion are often associated with  toxic reactions.  Performed at [facility], [place] [address] [place],  [address]   Rapid urine drug screen (hospital performed)     Status: Abnormal   Collection Time: 07/26/22 12:47 PM  Result Value Ref Range   Opiates NONE DETECTED NONE DETECTED   Cocaine NONE DETECTED  NONE DETECTED   Benzodiazepines NONE DETECTED NONE DETECTED   Amphetamines POSITIVE (A) NONE DETECTED   Tetrahydrocannabinol NONE DETECTED NONE DETECTED   Barbiturates NONE DETECTED NONE DETECTED    Comment: (NOTE) DRUG SCREEN FOR MEDICAL PURPOSES ONLY.  IF CONFIRMATION IS NEEDED FOR ANY PURPOSE, NOTIFY LAB WITHIN 5 DAYS.  LOWEST DETECTABLE LIMITS FOR URINE DRUG SCREEN Drug Class                     Cutoff (ng/mL) Amphetamine and metabolites    1000 Barbiturate and metabolites    200 Benzodiazepine                 200 Opiates and metabolites        300 Cocaine and metabolites        300 THC                            50 Performed at [facility], [place] [address], [place], [place] [address]   Urinalysis, Routine w reflex microscopic -Urine, Clean Catch     Status: Abnormal   Collection Time: 07/26/22 12:47 PM  Result Value Ref Range   Color, Urine YELLOW YELLOW   APPearance TURBID (A) CLEAR   Specific Gravity, Urine 1.013 1.005 - 1.030   pH 7.0 5.0 - 8.0   Glucose, UA NEGATIVE NEGATIVE mg/dL   Hgb urine dipstick SMALL (A) NEGATIVE   Bilirubin Urine NEGATIVE NEGATIVE   Ketones, ur NEGATIVE NEGATIVE mg/dL   Protein, ur 30 (A) NEGATIVE mg/dL   Nitrite NEGATIVE NEGATIVE    Leukocytes,Ua MODERATE (A) NEGATIVE   RBC / HPF 0-5 0 - 5 RBC/hpf   WBC, UA 0-5 0 - 5 WBC/hpf   Bacteria, UA NONE SEEN NONE SEEN   Squamous Epithelial / HPF 0-5 0 - 5 /HPF    Comment: Performed at [facility], [place] [address], [place], [place] [id number]  Basic metabolic panel     Status: Abnormal   Collection Time: 07/27/22  8:38 AM  Result Value Ref Range   Sodium 122 (L) 135 - 145 mmol/L    Comment: DELTA CHECK NOTED   Potassium 3.6 3.5 - 5.1 mmol/L   Chloride 88 (L) 98 - 111 mmol/L   CO2 24 22 - 32 mmol/L   Glucose, Bld 105 (H) 70 - 99 mg/dL    Comment: Glucose reference range applies only to samples taken after fasting for at least 8 hours.   BUN 6 (L) 8 - 23 mg/dL   Creatinine, Ser 0.55 0.44 - 1.00 mg/dL   Calcium 8.7 (L) 8.9 - 10.3 mg/dL   GFR, Estimated >60 >60 mL/min    Comment: (NOTE) Calculated using the CKD-EPI Creatinine Equation (2021)    Anion gap 10 5 - 15    Comment: Performed at [facility], [place] [address], [place], [place] [address]    Blood Alcohol level:  Lab Results  Component Value Date   ETH <10 07/26/2022   ETH 39 (H) 06/24/2022    Physical Findings:  CIWA:    COWS:     Musculoskeletal: Strength & Muscle Tone: seen in bed, states she ambulates with walker Gait & Station:  see above Patient leans:  see above  Psychiatric Specialty Exam:  Presentation  General Appearance:  Disheveled  Eye Contact: Fair  Speech: Clear and Coherent  Speech Volume: Normal  Handedness: Right   Mood and  Affect  Mood: Euthymic  Affect: Congruent; Depressed   Thought Process  Thought Processes: Coherent  Descriptions of Associations:Intact  Orientation:Partial  Thought Content:Logical  History of Schizophrenia/Schizoaffective disorder:No data recorded Duration of Psychotic Symptoms:No data recorded Hallucinations:Hallucinations: None  Ideas of Reference:None  Suicidal Thoughts:Suicidal Thoughts:  No  Homicidal Thoughts:Homicidal Thoughts: No   Sensorium  Memory: Immediate Good; Remote Good  Judgment: Fair  Insight: Fair   Community education officer  Concentration: Good  Attention Span: Fair  Recall: [place] of Knowledge: Good  Language: Good   Psychomotor Activity  Psychomotor Activity: Psychomotor Activity: Increased   Assets  Assets: Communication Skills; Social Support; Housing   Sleep  Sleep: Sleep: Poor    Physical Exam: Physical Exam Eyes:     Pupils: Pupils are equal, round, and reactive to light.  Cardiovascular:     Rate and Rhythm: Normal rate.  Pulmonary:     Effort: Pulmonary effort is normal.  Musculoskeletal:     Cervical back: Normal range of motion.  Neurological:     Mental Status: She is alert.  Psychiatric:        Attention and Perception: Attention normal.        Mood and Affect: Mood is depressed.        Speech: Speech normal.        Behavior: Behavior is cooperative.        Judgment: Judgment is inappropriate.     Comments: Patient continues to endorse depression, anxiety and grief.    Review of Systems  Constitutional: Negative.   HENT: Negative.    Respiratory: Negative.    Psychiatric/Behavioral:  Positive for depression and substance abuse.    Blood pressure 125/81, pulse 87, temperature (!) 97.3 F (36.3 C), temperature source Oral, resp. rate 19, height 5' 5"$  (1.651 m), weight 60.8 kg, SpO2 93 %. Body mass index is 22.3 kg/m.   Medical Decision Making: Patient continues to require inpatient Psychiatric hospitalization, once she is medically cleared.  EDP will address issues of hyponatremia and elevated white blood cells.   Saloma Cadena MOTLEY-MANGRUM, PMHNP 07/27/2022, 10:58 AM

## 2022-07-27 NOTE — Progress Notes (Signed)
Called by Charge RN in regard to finding a small bag of marijuana and lighters. Myself and night AC came to bedside to discuss safety concerns with patient. Patient consented to search of rooms and patient belongings. Agricultural consultant, bedside Rn, and security also at bedside. Patient went through all belongings in her purse and 2 prescription bottles were removed and bedside RN was going to secure in pharmacy. Cigarettes also found. Cigarettes and lighters were removed and secured by charge RN. Notified patient that illegal substance would be removed and disposed of. Patient expressed understanding.

## 2022-07-28 ENCOUNTER — Other Ambulatory Visit (HOSPITAL_COMMUNITY): Payer: Self-pay

## 2022-07-28 DIAGNOSIS — I10 Essential (primary) hypertension: Secondary | ICD-10-CM

## 2022-07-28 DIAGNOSIS — E222 Syndrome of inappropriate secretion of antidiuretic hormone: Secondary | ICD-10-CM

## 2022-07-28 DIAGNOSIS — I480 Paroxysmal atrial fibrillation: Secondary | ICD-10-CM

## 2022-07-28 LAB — COMPREHENSIVE METABOLIC PANEL
ALT: 12 U/L (ref 0–44)
AST: 17 U/L (ref 15–41)
Albumin: 3.4 g/dL — ABNORMAL LOW (ref 3.5–5.0)
Alkaline Phosphatase: 84 U/L (ref 38–126)
Anion gap: 10 (ref 5–15)
BUN: 6 mg/dL — ABNORMAL LOW (ref 8–23)
CO2: 24 mmol/L (ref 22–32)
Calcium: 8.9 mg/dL (ref 8.9–10.3)
Chloride: 96 mmol/L — ABNORMAL LOW (ref 98–111)
Creatinine, Ser: 0.5 mg/dL (ref 0.44–1.00)
GFR, Estimated: >60 mL/min (ref >60)
Glucose, Bld: 96 mg/dL (ref 70–99)
Potassium: 3.7 mmol/L (ref 3.5–5.1)
Sodium: 130 mmol/L — ABNORMAL LOW (ref 135–145)
Total Bilirubin: 0.5 mg/dL (ref 0.3–1.2)
Total Protein: 5.4 g/dL — ABNORMAL LOW (ref 6.5–8.1)

## 2022-07-28 LAB — GLUCOSE, CAPILLARY: Glucose-Capillary: 111 mg/dL — ABNORMAL HIGH (ref 70–99)

## 2022-07-28 LAB — CBC
HCT: 31.1 % — ABNORMAL LOW (ref 36.0–46.0)
Hemoglobin: 9.5 g/dL — ABNORMAL LOW (ref 12.0–15.0)
MCH: 20.8 pg — ABNORMAL LOW (ref 26.0–34.0)
MCHC: 30.5 g/dL (ref 30.0–36.0)
MCV: 68.1 fL — ABNORMAL LOW (ref 80.0–100.0)
Platelets: 245 10*3/uL (ref 150–400)
RBC: 4.57 MIL/uL (ref 3.87–5.11)
RDW: 25.5 % — ABNORMAL HIGH (ref 11.5–15.5)
WBC: 9.8 10*3/uL (ref 4.0–10.5)
nRBC: 0 % (ref 0.0–0.2)

## 2022-07-28 LAB — OSMOLALITY, URINE: Osmolality, Ur: 312 mOsm/kg (ref 300–900)

## 2022-07-28 LAB — SODIUM: Sodium: 129 mmol/L — ABNORMAL LOW (ref 135–145)

## 2022-07-28 NOTE — Hospital Course (Signed)
Karen Casey is a 67 y.o. female with a history of alcohol abuse, anxiety, depression cataracts, COPD, benzodiazepine abuse/withdrawal, seizures, dyspnea, GERD, hypertension, neuropathy, urinary incontinence. Patient presented initially for grief and depression. While in the emergency department, she was found to have worsening hyponatremia, leading to admission. Hyponatremia presumed secondary to excess fluid intake and possibly contributed to by Zoloft use. Patient received IV fluids and was fluid restricted.

## 2022-07-28 NOTE — Progress Notes (Signed)
Per provider Michaele Offer, PMHNP the EDP is medically admitting patient due to hyponatremia. Zoloft discontinued at this time, due to hyponatremia. Consulted with Dr. Dwyane Dee. This CSW will now remove pt from the [facility] shift report. TOC will now follow pt.     Benjaman Kindler, MSW, [facility] 07/28/2022 7:18 PM

## 2022-07-28 NOTE — Progress Notes (Addendum)
PROGRESS NOTE    Karen Casey  YQM:[id number] DOB: 26-Feb-1956 DOA: 07/26/2022 PCP: [name], MD   Brief Narrative: KEMIAH Casey is a 67 y.o. female with a history of alcohol abuse, anxiety, depression cataracts, COPD, benzodiazepine abuse/withdrawal, seizures, dyspnea, GERD, hypertension, neuropathy, urinary incontinence. Patient presented initially for grief and depression. While in the emergency department, she was found to have worsening hyponatremia, leading to admission. Hyponatremia presumed secondary to excess fluid intake and possibly contributed to by Zoloft use. Patient received IV fluids and was fluid restricted.   Assessment and Plan:  Hyponatremia Presumed secondary to excess water intake. Complicated by Zoloft use/SIADH. Sodium down to low of 122 with improvement on normal saline IV fluids and fluid restriction. Sodium up to 130 > 129 today -Continue fluid restriction -BMP in AM  Major depression Patient seen and evaluated by psychiatry; clear for discharge without need for inpatient psychiatry admission. Zoloft held secondary to hyponatremia. -Await psychiatry recommendations for medication management  Tobacco use -Continue nicotine patch  Primary hypertension Noted. Mostly controlled. Not on antihypertensive therapy.  COPD -Continue Breo Ellipta and Incruse Ellipta -Continue Duoneb PRN  Paroxysmal atrial fibrillation Patient is managed on amiodarone and Eliquis as an outpatient. Patient may h  Alcohol use Noted.  -Continue CIWA  GERD Noted. No symptoms. Currently not on treatment.   DVT prophylaxis: Eliquis Code Status:   Code Status: Full Code Family Communication: None at bedside Disposition Plan: Discharge home likely in 24 hours pending stable sodium and final psychiatry medication recommendations   Consultants:  Psychiatry  Procedures:  None  Antimicrobials: None    Subjective: Patient reports no issues overnight.  Asks about the ulcers of her lip. No other issues.  Objective: BP (!) 151/90 (BP Location: Left Arm)   Pulse 85   Temp 98.9 F (37.2 C) (Oral)   Resp 19   Ht '5\' 5"'$  (1.651 m)   Wt 60.8 kg   SpO2 95%   BMI 22.30 kg/m   Examination:  General exam: Appears calm and comfortable Respiratory system: Clear to auscultation. Respiratory effort normal. Cardiovascular system: S1 & S2 heard, RRR. No murmurs, rubs, gallops or clicks. Gastrointestinal system: Abdomen is nondistended, soft and nontender. Normal bowel sounds heard. Central nervous system: Alert and oriented. No focal neurological deficits. Musculoskeletal: No edema. No calf tenderness Skin: No cyanosis. No rashes Psychiatry: Judgement and insight appear normal. Mood & affect appropriate.    Data Reviewed: I have personally reviewed following labs and imaging studies  CBC Lab Results  Component Value Date   WBC 9.8 07/28/2022   RBC 4.57 07/28/2022   HGB 9.5 (L) 07/28/2022   HCT 31.1 (L) 07/28/2022   MCV 68.1 (L) 07/28/2022   MCH 20.8 (L) 07/28/2022   PLT 245 07/28/2022   MCHC 30.5 07/28/2022   RDW 25.5 (H) 07/28/2022   LYMPHSABS 1.1 07/26/2022   MONOABS 0.7 07/26/2022   EOSABS 0.0 07/26/2022   BASOSABS 0.0 52/84/1324     Last metabolic panel Lab Results  Component Value Date   NA 129 (L) 07/28/2022   K 3.7 07/28/2022   CL 96 (L) 07/28/2022   CO2 24 07/28/2022   BUN 6 (L) 07/28/2022   CREATININE 0.50 07/28/2022   GLUCOSE 96 07/28/2022   GFRNONAA >60 07/28/2022   GFRAA 104 06/20/2020   CALCIUM 8.9 07/28/2022   PHOS 4.0 11/30/2021   PROT 5.4 (L) 07/28/2022   ALBUMIN 3.4 (L) 07/28/2022   LABGLOB 2.2 06/11/2022   AGRATIO 2.0  06/11/2022   BILITOT 0.5 07/28/2022   ALKPHOS 84 07/28/2022   AST 17 07/28/2022   ALT 12 07/28/2022   ANIONGAP 10 07/28/2022    GFR: Estimated Creatinine Clearance: 63.9 mL/min (by C-G formula based on SCr of 0.5 mg/dL).  Recent Results (from the past 240 hour(s))  Resp panel  by RT-PCR (RSV, Flu A&B, Covid) Anterior Nasal Swab     Status: None   Collection Time: 07/26/22  3:18 AM   Specimen: Anterior Nasal Swab  Result Value Ref Range Status   SARS Coronavirus 2 by RT PCR NEGATIVE NEGATIVE Final    Comment: (NOTE) SARS-CoV-2 target nucleic acids are NOT DETECTED.  The SARS-CoV-2 RNA is generally detectable in upper respiratory specimens during the acute phase of infection. The lowest concentration of SARS-CoV-2 viral copies this assay can detect is 138 copies/mL. A negative result does not preclude SARS-Cov-2 infection and should not be used as the sole basis for treatment or other patient management decisions. A negative result may occur with  improper specimen collection/handling, submission of specimen other than nasopharyngeal swab, presence of viral mutation(s) within the areas targeted by this assay, and inadequate number of viral copies(<138 copies/mL). A negative result must be combined with clinical observations, patient history, and epidemiological information. The expected result is Negative.  Fact Sheet for Patients:  EntrepreneurPulse.com.au  Fact Sheet for Healthcare Providers:  IncredibleEmployment.be  This test is no t yet approved or cleared by the [place] FDA and  has been authorized for detection and/or diagnosis of SARS-CoV-2 by FDA under an Emergency Use Authorization (EUA). This EUA will remain  in effect (meaning this test can be used) for the duration of the COVID-19 declaration under Section 564(b)(1) of the Act, 21 U.S.C.section 360bbb-3(b)(1), unless the authorization is terminated  or revoked sooner.       Influenza A by PCR NEGATIVE NEGATIVE Final   Influenza B by PCR NEGATIVE NEGATIVE Final    Comment: (NOTE) The Xpert Xpress SARS-CoV-2/FLU/RSV plus assay is intended as an aid in the diagnosis of influenza from Nasopharyngeal swab specimens and should not be used as a sole  basis for treatment. Nasal washings and aspirates are unacceptable for Xpert Xpress SARS-CoV-2/FLU/RSV testing.  Fact Sheet for Patients: EntrepreneurPulse.com.au  Fact Sheet for Healthcare Providers: IncredibleEmployment.be  This test is not yet approved or cleared by the [place] FDA and has been authorized for detection and/or diagnosis of SARS-CoV-2 by FDA under an Emergency Use Authorization (EUA). This EUA will remain in effect (meaning this test can be used) for the duration of the COVID-19 declaration under Section 564(b)(1) of the Act, 21 U.S.C. section 360bbb-3(b)(1), unless the authorization is terminated or revoked.     Resp Syncytial Virus by PCR NEGATIVE NEGATIVE Final    Comment: (NOTE) Fact Sheet for Patients: EntrepreneurPulse.com.au  Fact Sheet for Healthcare Providers: IncredibleEmployment.be  This test is not yet approved or cleared by the [place] FDA and has been authorized for detection and/or diagnosis of SARS-CoV-2 by FDA under an Emergency Use Authorization (EUA). This EUA will remain in effect (meaning this test can be used) for the duration of the COVID-19 declaration under Section 564(b)(1) of the Act, 21 U.S.C. section 360bbb-3(b)(1), unless the authorization is terminated or revoked.  Performed at [facility], [place] [address] [place],  [address]       Radiology Studies: No results found.    LOS: 1 day    Cordelia Poche, MD [facility] 07/28/2022, 3:15 PM  If 7PM-7AM, please contact night-coverage [email or web address]

## 2022-07-28 NOTE — TOC Initial Note (Addendum)
Transition of Care [facility]) - Initial/Assessment Note    Patient Details  Name: Karen Casey MRN: [id number] Date of Birth: 1956-04-15  Transition of Care [facility]) CM/SW Contact:    Dessa Phi, RN Phone Number: 07/28/2022, 1:15 PM  Clinical Narrative: Active w/[facility]/OT;rep Amy aware-will need face to face @ d/c. HH orders already place. Has own transportation home.  -3:47p-patient has medicare part A,states her sister started process for medicaid for the patient.,has good rx card. Infomred her about MATCH program-will assess once meds listed on d/c if qualify-she has $3 co pay-informed her that program is for new meds since she has the old meds already, no controlled meds or OTC qualify-she voiced understanding.              Expected Discharge Plan: [place] Barriers to Discharge: Continued Medical Work up   Patient Goals and CMS Choice Patient states their goals for this hospitalization and ongoing recovery are::  (Home) CMS Medicare.gov Compare Post Acute Care list provided to:: Patient Choice offered to / list presented to : Patient [place] ownership interest in [facility].provided to:: Patient    Expected Discharge Plan and Services     Post Acute Care Choice: Home Health (Active [facility] HHPT/OT)                                        Prior Living Arrangements/Services                       Activities of Daily Living      Permission Sought/Granted                  Emotional Assessment              Admission diagnosis:  Hyponatremia [E87.1] Depression, unspecified depression type [F32.A] Patient Active Problem List   Diagnosis Date Noted   Hyponatremia 07/27/2022   Severe episode of recurrent major depressive disorder, without psychotic features ([place]) 07/26/2022   Nausea vomiting and diarrhea 02/09/2022   Generalized abdominal pain    Colitis    Dysphagia 01/06/2022   Ambulatory dysfunction  01/06/2022   Acute respiratory failure with hypoxia (HCC)    Right rib fracture 11/20/2021   Elevated LFTs 11/20/2021   Osteoporosis 09/23/2021   Vitamin D deficiency 07/03/2021   Peripheral neuropathy    Seborrheic keratoses 05/29/2021   Hepatic steatosis 01/05/2021   Hypokalemia 01/02/2021   COPD (chronic obstructive pulmonary disease) ([place]) 12/09/2020   Chronic pain of breast 11/12/2020   Compression fracture of body of thoracic vertebra ([place]) 11/06/2020   SIADH (syndrome of inappropriate ADH production) ([place]) 10/12/2020   Hypomagnesemia    Paroxysmal atrial fibrillation ([place])    Alcohol withdrawal ([place]) 09/10/2020   Pulmonary nodules/lesions, multiple 07/14/2020   HTN (hypertension) 06/02/2018   Left foot pain 08/23/2017   Anxiety 06/12/2012   Alcohol use 05/28/2012   Tobacco abuse 02/17/2012   GERD (gastroesophageal reflux disease) 02/04/2012   Depression 02/04/2012   PCP:  [name], MD Pharmacy:   [id number] [place], [id number]: [phone]: [address]Phone: [phone]: [phone] [place]  [place] [address] Phone: [phone] Fax: [phone]     Social Determinants of Health (SDOH) Social History: SDOH Screenings   Food Insecurity: No Food Insecurity (12/25/2021)  Housing: Low Risk  (12/25/2021)  Depression (PHQ2-9): High Risk (06/11/2022)  Tobacco Use: High Risk (07/26/2022)   SDOH Interventions:     Readmission Risk Interventions    01/26/2022   11:11 AM 01/03/2022   11:30 AM 12/01/2021    3:19 PM  Readmission Risk Prevention Plan  Transportation Screening Complete Complete Complete  Medication Review Press photographer)  Complete Complete  PCP or Specialist appointment within 3-5 days of  discharge Complete Complete Complete  HRI or Home Care Consult Complete Complete Complete  SW Recovery Care/Counseling Consult Complete Complete Complete  Palliative Care Screening Not Applicable Not Applicable Not [place] Not Applicable Not Applicable Patient Refused

## 2022-07-29 ENCOUNTER — Other Ambulatory Visit (HOSPITAL_COMMUNITY): Payer: Self-pay

## 2022-07-29 ENCOUNTER — Telehealth: Payer: Self-pay

## 2022-07-29 DIAGNOSIS — F4320 Adjustment disorder, unspecified: Secondary | ICD-10-CM

## 2022-07-29 DIAGNOSIS — F4321 Adjustment disorder with depressed mood: Secondary | ICD-10-CM

## 2022-07-29 DIAGNOSIS — J449 Chronic obstructive pulmonary disease, unspecified: Secondary | ICD-10-CM

## 2022-07-29 DIAGNOSIS — F332 Major depressive disorder, recurrent severe without psychotic features: Secondary | ICD-10-CM

## 2022-07-29 LAB — BASIC METABOLIC PANEL
Anion gap: 10 (ref 5–15)
BUN: 9 mg/dL (ref 8–23)
CO2: 25 mmol/L (ref 22–32)
Calcium: 9.1 mg/dL (ref 8.9–10.3)
Chloride: 95 mmol/L — ABNORMAL LOW (ref 98–111)
Creatinine, Ser: 0.72 mg/dL (ref 0.44–1.00)
GFR, Estimated: >60 mL/min (ref >60)
Glucose, Bld: 96 mg/dL (ref 70–99)
Potassium: 3.4 mmol/L — ABNORMAL LOW (ref 3.5–5.1)
Sodium: 130 mmol/L — ABNORMAL LOW (ref 135–145)

## 2022-07-29 LAB — GLUCOSE, CAPILLARY
Glucose-Capillary: 109 mg/dL — ABNORMAL HIGH (ref 70–99)
Glucose-Capillary: 128 mg/dL — ABNORMAL HIGH (ref 70–99)

## 2022-07-29 MED ORDER — POTASSIUM CHLORIDE CRYS ER 20 MEQ PO TBCR
40.0000 meq | EXTENDED_RELEASE_TABLET | Freq: Once | ORAL | Status: AC
  Administered 2022-07-29: 40 meq via ORAL
  Filled 2022-07-29: qty 2

## 2022-07-29 NOTE — Progress Notes (Signed)
Received call from patient's primary treatment team.  Chart is reviewed.  Patient initially presented to the emergency department for depressed mood in the context of her grief reaction.  Psychiatry was initially consulted with a recommendation to start sertraline in addition to her home medications (Seroquel 200 mg at bedtime, hydroxyzine 10 mg 3 times daily for anxiety).  Patient was found to be hyponatremic and was admitted medically.  Zoloft was held.  Patient was psychiatrically cleared from the emergency department after safety planning was completed with patient's sister.  Outpatient resources were provided for patient to have follow-up with her medication management as well as therapy services.  Patient was agreeable to starting outpatient services.  At this time, would not start Zoloft, in the setting of recent hyponatremia and uncertainty of outpatient follow-up.  I have asked primary treatment team to ensure patient present to psychiatric walk-in clinic tomorrow after being discharged into the care of her sister today. While future psychiatric events cannot be accurately predicted, the patient by chart review and discussion with primary treatment team, does not currently require acute inpatient psychiatric care and does not currently meet [facility] involuntary commitment criteria.  Karen Hammock, MD

## 2022-07-29 NOTE — Progress Notes (Signed)
Patient is unable to be discharged home for the night due to, both options for transportation not being able to pick up the patient. Patient's daughter unable to d/t having no time to get patient this late and patient's sister is out of town and will not be home until the morning of 2/16 (Per patient). Patient offered to try an uber or take a bus and patient refused those option d/t her not having keys to her own home nor to her sister's home (patient's sister's house is disposition for D/C per patient). MD and day shift charge RN aware.

## 2022-07-29 NOTE — Telephone Encounter (Signed)
Patient calls nurse line requesting samples on Eliquis. Patient is currently in the hospital and will be discharged later today.   She has follow up appointment tomorrow and was wanting to go ahead and make this request.   Will forward to PCP and pharmacy team.   Talbot Grumbling, RN

## 2022-07-29 NOTE — Progress Notes (Signed)
Mobility Specialist - Progress Note   07/29/22 1445  Mobility  Activity Ambulated with assistance in room  Level of Assistance Standby assist, set-up cues, supervision of patient - no hands on  Assistive Device Front wheel walker  Distance Ambulated (ft) 40 ft  Range of Motion/Exercises Active  Activity Response Tolerated well  $Mobility charge 1 Mobility   Pt was found in bed and agreeable to ambulate. Stated feeling weak during ambulation. At EOS returned to bed with all necessities in reach. RN notified of session.  Ferd Hibbs Mobility Specialist

## 2022-07-29 NOTE — Discharge Summary (Signed)
Physician Discharge Summary   Patient: Karen Casey MRN: [id number] DOB: [birth year]  Admit date:     07/26/2022  Discharge date: 07/29/22  Discharge Physician: Cordelia Poche, MD   PCP: [name], MD   Recommendations at discharge:  Hospital follow-up with PCP Behavioral health follow-up Repeat BMP on 2/19  Discharge Diagnoses: Principal Problem:   Hyponatremia Active Problems:   Tobacco abuse   HTN (hypertension)   COPD (chronic obstructive pulmonary disease) (HCC)   Paroxysmal atrial fibrillation (HCC)   Alcohol use   GERD (gastroesophageal reflux disease)   SIADH (syndrome of inappropriate ADH production) (HCC)   Severe episode of recurrent major depressive disorder, without psychotic features ([place])  Resolved Problems:   * No resolved hospital problems. *  Hospital Course: Karen Casey is a 67 y.o. female with a history of alcohol abuse, anxiety, depression cataracts, COPD, benzodiazepine abuse/withdrawal, seizures, dyspnea, GERD, hypertension, neuropathy, urinary incontinence. Patient presented initially for grief and depression. While in the emergency department, she was found to have worsening hyponatremia, leading to admission. Hyponatremia presumed secondary to excess fluid intake and possibly contributed to by Zoloft use. Patient received IV fluids and was fluid restricted.  Assessment and Plan:  Hyponatremia Presumed secondary to excess water intake. Complicated by Zoloft use/SIADH. Sodium down to low of 122 with improvement on normal saline IV fluids and fluid restriction. Sodium up to 130 and stable. Continue fluid restriction. Recheck BMP as an outpatient.   Major depression Patient seen and evaluated by psychiatry; clear for discharge without need for inpatient psychiatry admission. Zoloft held secondary to hyponatremia. Psychiatry recommendation to resume Seroquel on admission and to follow-up with behavioral health walk in clinic.   Tobacco  use Managed with nicotine patch while admitted.   Primary hypertension Noted. Mostly controlled. Not on antihypertensive therapy.   COPD -Continue Breo Ellipta and Incruse Ellipta -Continue Duoneb PRN   Paroxysmal atrial fibrillation Patient is managed on amiodarone and Eliquis as an outpatient. Patient may h   Alcohol use Noted.  -Continue CIWA   GERD Noted. No symptoms. Currently not on treatment.  Chronic anemia Stable. Improved from one month prior. Outpatient follow-up.   Consultants: Psychiatry Procedures performed: None  Disposition: Home Diet recommendation: Regular diet, fluid restriction of 1.5 L   DISCHARGE MEDICATION: Allergies as of 07/29/2022       Reactions   Capsaicin-menthol Rash, Other (See Comments)   Burning and peeling on area applied to   Diclo Gel [diclofenac Sodium] Rash, Other (See Comments)   Burning and peeling at the sight of application.        Medication List     STOP taking these medications    busPIRone 10 MG tablet Commonly known as: BUSPAR       TAKE these medications    albuterol 108 (90 Base) MCG/ACT inhaler Commonly known as: VENTOLIN HFA Inhale 2 puffs into the lungs every 6 (six) hours as needed for wheezing or shortness of breath.   amiodarone 200 MG tablet Commonly known as: PACERONE Take 1 tablet (200 mg total) by mouth daily.   apixaban 5 MG Tabs tablet Commonly known as: ELIQUIS Take 1 tablet (5 mg total) by mouth 2 (two) times daily.   gabapentin 800 MG tablet Commonly known as: Neurontin Take 1 tablet (800 mg total) by mouth 3 (three) times daily.   hydrOXYzine 10 MG tablet Commonly known as: ATARAX TAKE 1 TABLET BY MOUTH 3 TIMES DAILY AS NEEDED FOR ANXIETY.   nicotine 14  mg/24hr patch Commonly known as: NICODERM CQ - dosed in mg/24 hours Place 14 mg onto the skin daily.   QUEtiapine 200 MG tablet Commonly known as: SEROQUEL TAKE 1 TABLET BY MOUTH AT BEDTIME.   Trelegy Ellipta 100-62.5-25  MCG/ACT Aepb Generic drug: Fluticasone-Umeclidin-Vilant Inhale 2 puffs into the lungs daily.        [place]. Schedule an appointment as soon as possible for a visit in 1 week(s).   Specialty: Urgent Care Why: Make an appointment to talk with therapist and to assist in medication management. Contact information: [place]. Follow up.   Why: [place] physical therapy/occupational therapy Contact information: [place] [address] [phone]                Discharge Exam: BP (!) 148/70 (BP Location: Right Arm)   Pulse 75   Temp 98.3 F (36.8 C) (Oral)   Resp 20   Ht 5' 5"$  (1.651 m)   Wt 60.8 kg   SpO2 92%   BMI 22.30 kg/m   General exam: Appears calm and comfortable Respiratory system: Clear to auscultation. Respiratory effort normal. Cardiovascular system: S1 & S2 heard, Normal rate with regular rhythm. Gastrointestinal system: Abdomen is nondistended, soft and nontender. Normal bowel sounds heard. Central nervous system: Alert and oriented. Musculoskeletal: No edema. No calf tenderness  Condition at discharge: stable  The results of significant diagnostics from this hospitalization (including imaging, microbiology, ancillary and laboratory) are listed below for reference.   Imaging Studies: DG Chest 1 View  Result Date: 07/26/2022 CLINICAL DATA:  Patient reports feeling as if she is going to have a mental breakdown. EXAM: CHEST  1 VIEW COMPARISON:  06/24/2022 FINDINGS: Heart size and mediastinal contours are unremarkable. There is no pleural effusion or edema. No airspace opacities identified. Visualized osseous structures are unremarkable. IMPRESSION: No active disease. Electronically Signed   By: Kerby Moors M.D.   On: 07/26/2022 04:40    Microbiology: Results for orders placed or performed  during the hospital encounter of 07/26/22  Resp panel by RT-PCR (RSV, Flu A&B, Covid) Anterior Nasal Swab     Status: None   Collection Time: 07/26/22  3:18 AM   Specimen: Anterior Nasal Swab  Result Value Ref Range Status   SARS Coronavirus 2 by RT PCR NEGATIVE NEGATIVE Final    Comment: (NOTE) SARS-CoV-2 target nucleic acids are NOT DETECTED.  The SARS-CoV-2 RNA is generally detectable in upper respiratory specimens during the acute phase of infection. The lowest concentration of SARS-CoV-2 viral copies this assay can detect is 138 copies/mL. A negative result does not preclude SARS-Cov-2 infection and should not be used as the sole basis for treatment or other patient management decisions. A negative result may occur with  improper specimen collection/handling, submission of specimen other than nasopharyngeal swab, presence of viral mutation(s) within the areas targeted by this assay, and inadequate number of viral copies(<138 copies/mL). A negative result must be combined with clinical observations, patient history, and epidemiological information. The expected result is Negative.  Fact Sheet for Patients:  EntrepreneurPulse.com.au  Fact Sheet for Healthcare Providers:  IncredibleEmployment.be  This test is no t yet approved or cleared by the [place] FDA and  has been authorized for detection and/or diagnosis of SARS-CoV-2 by FDA under an Emergency Use Authorization (EUA). This EUA  will remain  in effect (meaning this test can be used) for the duration of the COVID-19 declaration under Section 564(b)(1) of the Act, 21 U.S.C.section 360bbb-3(b)(1), unless the authorization is terminated  or revoked sooner.       Influenza A by PCR NEGATIVE NEGATIVE Final   Influenza B by PCR NEGATIVE NEGATIVE Final    Comment: (NOTE) The Xpert Xpress SARS-CoV-2/FLU/RSV plus assay is intended as an aid in the diagnosis of influenza from  Nasopharyngeal swab specimens and should not be used as a sole basis for treatment. Nasal washings and aspirates are unacceptable for Xpert Xpress SARS-CoV-2/FLU/RSV testing.  Fact Sheet for Patients: EntrepreneurPulse.com.au  Fact Sheet for Healthcare Providers: IncredibleEmployment.be  This test is not yet approved or cleared by the [place] FDA and has been authorized for detection and/or diagnosis of SARS-CoV-2 by FDA under an Emergency Use Authorization (EUA). This EUA will remain in effect (meaning this test can be used) for the duration of the COVID-19 declaration under Section 564(b)(1) of the Act, 21 U.S.C. section 360bbb-3(b)(1), unless the authorization is terminated or revoked.     Resp Syncytial Virus by PCR NEGATIVE NEGATIVE Final    Comment: (NOTE) Fact Sheet for Patients: EntrepreneurPulse.com.au  Fact Sheet for Healthcare Providers: IncredibleEmployment.be  This test is not yet approved or cleared by the [place] FDA and has been authorized for detection and/or diagnosis of SARS-CoV-2 by FDA under an Emergency Use Authorization (EUA). This EUA will remain in effect (meaning this test can be used) for the duration of the COVID-19 declaration under Section 564(b)(1) of the Act, 21 U.S.C. section 360bbb-3(b)(1), unless the authorization is terminated or revoked.  Performed at [facility], [place] [address] [place], [place] [address]     Labs: CBC: Recent Labs  Lab 07/26/22 0426 07/28/22 0823  WBC 11.1* 9.8  NEUTROABS 9.3*  --   HGB 9.5* 9.5*  HCT 30.8* 31.1*  MCV 67.0* 68.1*  PLT 313 [id number]   Basic Metabolic Panel: Recent Labs  Lab 07/26/22 0426 07/27/22 0838 07/27/22 1416 07/28/22 0823 07/28/22 1226 07/29/22 0536  NA 129* 122* 124* 130* 129* 130*  K 3.6 3.6 3.9 3.7  --  3.4*  CL 92* 88* 91* 96*  --  95*  CO2 26 24 23 24  $ --  25  GLUCOSE 112*  105* 102* 96  --  96  BUN 12 6* 6* 6*  --  9  CREATININE 0.81 0.55 0.57 0.50  --  0.72  CALCIUM 9.7 8.7* 8.5* 8.9  --  9.1   Liver Function Tests: Recent Labs  Lab 07/26/22 0426 07/28/22 0823  AST 20 17  ALT 14 12  ALKPHOS 85 84  BILITOT 0.5 0.5  PROT 7.3 5.4*  ALBUMIN 4.6 3.4*   CBG: Recent Labs  Lab 07/28/22 2115 07/29/22 0740  GLUCAP 111* 109*    Discharge time spent: 35 minutes.  Signed: Cordelia Poche, MD [facility] 07/29/2022

## 2022-07-29 NOTE — Plan of Care (Signed)

## 2022-07-30 ENCOUNTER — Ambulatory Visit: Payer: Medicaid Other | Admitting: Family Medicine

## 2022-07-30 ENCOUNTER — Other Ambulatory Visit (HOSPITAL_COMMUNITY): Payer: Self-pay

## 2022-07-30 ENCOUNTER — Other Ambulatory Visit: Payer: Self-pay | Admitting: Family Medicine

## 2022-07-30 ENCOUNTER — Telehealth: Payer: Self-pay

## 2022-07-30 DIAGNOSIS — I4891 Unspecified atrial fibrillation: Secondary | ICD-10-CM

## 2022-07-30 MED ORDER — APIXABAN 5 MG PO TABS
5.0000 mg | ORAL_TABLET | Freq: Two times a day (BID) | ORAL | 0 refills | Status: DC
  Filled 2022-07-30: qty 60, 30d supply, fill #0

## 2022-07-30 NOTE — Progress Notes (Signed)
Thirty day supply of Eliquis sent to [facility] where our pharmacy team has noted will be free for patient to pick up.   Called Patient to let her know Eliquis available at the [facility], she was appreciative of the call and has f/u appt scheduled next week 08/02/22.   Answered all questions and concerns. Yehuda Savannah MD

## 2022-07-30 NOTE — Telephone Encounter (Signed)
Patient's brother in law presents to clinic for samples.   Provided with samples per Dr. Valentina Lucks.   Talbot Grumbling, RN

## 2022-07-30 NOTE — Telephone Encounter (Signed)
Reviewed and agree with Dr Graylin Shiver plan.

## 2022-07-30 NOTE — Progress Notes (Signed)
Patient to be discharged to home this afternoon. Patient and Patient's Sister Hilda Blades given discharge instructions including all Medications and schedules for these Medications. Understanding verbalized by both the Patient and Patient's Sister. Home Medications returned to the Patient and form signed by the Patient. Personal belongings lighters and cigarettes returned to the patient. Discharge AVS with Patient at time of discharge

## 2022-07-30 NOTE — Telephone Encounter (Signed)
Plan to use new prescription + voucher for Eliquis failed due to patient using that approach previously.    Contacted patient and informed that samples were available.  Patient will pick-up on Monday 2/19 as she has 8 pills (adequate supply for the weekend).   Medication Samples have been provided for patient pick-up.  Drug name: eliquis (apixaban)       Strength: 17m        Qty: 42 tablets   LOT: [id number] Exp.Date: 10/12/2023 and 01/12/2024  Dosing instructions: 1 tablet BID  The patient has been instructed regarding the correct time, dose, and frequency of taking this medication, including desired effects and most common side effects.   [name]2:20 PM 07/30/2022   Patient aware of supply availability.

## 2022-07-30 NOTE — Telephone Encounter (Signed)
Opened encounter in error.

## 2022-07-30 NOTE — TOC Transition Note (Signed)
Transition of Care [facility]) - CM/SW Discharge Note   Patient Details  Name: Karen Casey MRN: [id number] Date of Birth: [birth year]  Transition of Care [facility]) CM/SW Contact:  Bethann Berkshire, [place] Phone Number: 07/30/2022, 9:27 AM   Clinical Narrative:     Pt was previously active with [facility]; CSW notified [facility] rep of DC today.   Per pharmacy, pt does not have prescription coverage. To qualify for Eliquis assistance program pt will need submit documentation showing she has spent 3% of annual household income on out-of-pocket prescription expenses for her and/or other members of her household for the year in which you are seeking assistance. Attending spoke with pt's OP provider who will assist with Eliquis for now.   Final next level of care: Home w Home Health Services Barriers to Discharge: No Barriers Identified   Patient Goals and CMS Choice CMS Medicare.gov Compare Post Acute Care list provided to:: Patient Choice offered to / list presented to : Patient  Discharge Placement                         Discharge Plan and Services Additional resources added to the After Visit Summary for       Post Acute Care Choice: [place] (Active [facility] HHPT/OT)                    HH Arranged: PT HH Agency: [place]        Social Determinants of Health (SDOH) Interventions SDOH Screenings   Food Insecurity: No Food Insecurity (12/25/2021)  Housing: Low Risk  (12/25/2021)  Depression (PHQ2-9): High Risk (06/11/2022)  Tobacco Use: High Risk (07/26/2022)     Readmission Risk Interventions    07/28/2022    1:21 PM 01/26/2022   11:11 AM 01/03/2022   11:30 AM  Readmission Risk Prevention Plan  Transportation Screening Complete Complete Complete  PCP or Specialist Appt within 3-5 Days Complete    HRI or [place] Complete    Social Work Consult for [place] Planning/Counseling Complete    Palliative Care Screening Not Applicable     Medication Review Press photographer) Complete  Complete  PCP or Specialist appointment within 3-5 days of discharge  Complete Complete  HRI or [place]  Complete Complete  SW Recovery Care/Counseling Consult  Complete Complete  Palliative Care Screening  Not Applicable Not [place]  Not Applicable Not Applicable

## 2022-08-02 ENCOUNTER — Ambulatory Visit: Payer: Medicaid Other | Admitting: Student

## 2022-08-03 ENCOUNTER — Other Ambulatory Visit (HOSPITAL_COMMUNITY): Payer: Self-pay

## 2022-08-03 ENCOUNTER — Telehealth: Payer: Self-pay

## 2022-08-03 NOTE — Telephone Encounter (Signed)
Karen Casey [facility] PT calls nurse line requesting verbal orders.  Verbal order for initial PT evaluation.  Verbal order for medical social worker.   Verbal orders given.

## 2022-08-05 NOTE — Telephone Encounter (Signed)
Patient would like application mailed to her home.  Income is disability - only has medicare part A & B, no prescription coverage.

## 2022-08-13 ENCOUNTER — Ambulatory Visit: Payer: Medicaid Other | Admitting: Family Medicine

## 2022-08-14 ENCOUNTER — Other Ambulatory Visit: Payer: Self-pay | Admitting: Family Medicine

## 2022-08-14 DIAGNOSIS — K219 Gastro-esophageal reflux disease without esophagitis: Secondary | ICD-10-CM

## 2022-08-14 DIAGNOSIS — F419 Anxiety disorder, unspecified: Secondary | ICD-10-CM

## 2022-08-14 DIAGNOSIS — R1310 Dysphagia, unspecified: Secondary | ICD-10-CM

## 2022-08-15 ENCOUNTER — Other Ambulatory Visit: Payer: Self-pay | Admitting: Family Medicine

## 2022-08-15 DIAGNOSIS — F32A Depression, unspecified: Secondary | ICD-10-CM

## 2022-08-21 ENCOUNTER — Other Ambulatory Visit (HOSPITAL_COMMUNITY): Payer: Self-pay

## 2022-08-23 ENCOUNTER — Other Ambulatory Visit (HOSPITAL_COMMUNITY): Payer: Self-pay

## 2022-09-13 ENCOUNTER — Ambulatory Visit: Payer: Medicaid Other | Admitting: Family Medicine

## 2022-09-13 NOTE — Progress Notes (Deleted)
    SUBJECTIVE:   CHIEF COMPLAINT / HPI:   Hospital follow up- admitted with hyponatremia, depression and inability to care for herself. Was given IVF and fluid restriction. Her home Zoloft held (she has had h/o SIADH with SSRIs).   Alcohol use disorder- last drink was ***  Afib- currently on amiodarone and Eliquis?  PERTINENT  PMH / PSH: alcohol use disorder, hyponatremia/SIADH, GERD, HTN, peripheral neuropathy, urinary incontinence, alcohol withdrawal seizures, Afib with RVR, COPD, tobacco use disorder  OBJECTIVE:   There were no vitals taken for this visit.  General: A&O, NAD HEENT: No sign of trauma, EOM grossly intact Cardiac: RRR, no m/r/g Respiratory: CTAB, normal WOB, no w/c/r GI: Soft, NTTP, non-distended  Extremities: NTTP, no peripheral edema. Neuro: Normal gait, moves all four extremities appropriately. Psych: Appropriate mood and affect   ASSESSMENT/PLAN:   No problem-specific Assessment & Plan notes found for this encounter.     [name], MD [place]

## 2022-09-17 ENCOUNTER — Other Ambulatory Visit: Payer: Self-pay | Admitting: Family Medicine

## 2022-09-17 DIAGNOSIS — I10 Essential (primary) hypertension: Secondary | ICD-10-CM

## 2022-09-17 DIAGNOSIS — K219 Gastro-esophageal reflux disease without esophagitis: Secondary | ICD-10-CM

## 2022-09-17 DIAGNOSIS — R1310 Dysphagia, unspecified: Secondary | ICD-10-CM

## 2022-09-17 DIAGNOSIS — F419 Anxiety disorder, unspecified: Secondary | ICD-10-CM

## 2022-09-18 ENCOUNTER — Other Ambulatory Visit: Payer: Self-pay | Admitting: Family Medicine

## 2022-09-18 DIAGNOSIS — K219 Gastro-esophageal reflux disease without esophagitis: Secondary | ICD-10-CM

## 2022-09-20 NOTE — Telephone Encounter (Signed)
No longer on medication list--declined

## 2022-10-12 ENCOUNTER — Other Ambulatory Visit: Payer: Self-pay | Admitting: Family Medicine

## 2022-10-12 DIAGNOSIS — K219 Gastro-esophageal reflux disease without esophagitis: Secondary | ICD-10-CM

## 2022-10-12 DIAGNOSIS — I10 Essential (primary) hypertension: Secondary | ICD-10-CM

## 2022-10-12 DIAGNOSIS — F32A Depression, unspecified: Secondary | ICD-10-CM

## 2022-10-13 ENCOUNTER — Other Ambulatory Visit: Payer: Self-pay | Admitting: Family Medicine

## 2022-10-13 DIAGNOSIS — R1310 Dysphagia, unspecified: Secondary | ICD-10-CM

## 2022-10-13 DIAGNOSIS — F419 Anxiety disorder, unspecified: Secondary | ICD-10-CM

## 2022-10-14 ENCOUNTER — Other Ambulatory Visit: Payer: Self-pay | Admitting: Family Medicine

## 2022-10-14 DIAGNOSIS — F419 Anxiety disorder, unspecified: Secondary | ICD-10-CM

## 2022-10-22 ENCOUNTER — Ambulatory Visit (INDEPENDENT_AMBULATORY_CARE_PROVIDER_SITE_OTHER): Payer: Medicare HMO

## 2022-10-22 VITALS — Ht 65.0 in | Wt 134.0 lb

## 2022-10-22 DIAGNOSIS — Z1231 Encounter for screening mammogram for malignant neoplasm of breast: Secondary | ICD-10-CM | POA: Diagnosis not present

## 2022-10-22 DIAGNOSIS — Z0000 Encounter for general adult medical examination without abnormal findings: Secondary | ICD-10-CM | POA: Diagnosis not present

## 2022-10-22 DIAGNOSIS — Z0100 Encounter for examination of eyes and vision without abnormal findings: Secondary | ICD-10-CM

## 2022-10-23 NOTE — Patient Instructions (Addendum)
Karen Casey , Thank you for taking time to come for your Medicare Wellness Visit. I appreciate your ongoing commitment to your health goals. Please review the following plan we discussed and let me know if I can assist you in the future.   These are the goals we discussed:  Goals      Seek counseling        This is a list of the screening recommended for you and due dates:  Health Maintenance  Topic Date Due   Zoster (Shingles) Vaccine (1 of 2) Never done   COVID-19 Vaccine (5 - 2023-24 season) 02/12/2022   Pap Smear  08/24/2022   Screening for Lung Cancer  11/23/2022   Flu Shot  01/13/2023   Mammogram  02/05/2023   Colon Cancer Screening  03/12/2024   DTaP/Tdap/Td vaccine (2 - Td or Tdap) 04/18/2031   Hepatitis C Screening: USPSTF Recommendation to screen - Ages 81-79 yo.  Completed   HIV Screening  Completed   HPV Vaccine  Aged Out    Advanced directives: Information on Advanced Care Planning can be found at [facility] Advance Health Care Directives Advance Health Care Directives ([email or web address]    Conditions/risks identified: Aim for 30 minutes of exercise or brisk walking, 6-8 glasses of water, and 5 servings of fruits and vegetables each day.   Next appointment: Follow up in one year for your annual wellness visit.   The number to schedule your mammogram at [facility] is [phone]   Preventive Care 40-64 Years, Female Preventive care refers to lifestyle choices and visits with your health care provider that can promote health and wellness. What does preventive care include? A yearly physical exam. This is also called an annual well check. Dental exams once or twice a year. Routine eye exams. Ask your health care provider how often you should have your eyes checked. Personal lifestyle choices, including: Daily care of your teeth and gums. Regular physical activity. Eating a healthy diet. Avoiding tobacco and drug use. Limiting alcohol  use. Practicing safe sex. Taking low-dose aspirin daily starting at age 46. Taking vitamin and mineral supplements as recommended by your health care provider. What happens during an annual well check? The services and screenings done by your health care provider during your annual well check will depend on your age, overall health, lifestyle risk factors, and family history of disease. Counseling  Your health care provider may ask you questions about your: Alcohol use. Tobacco use. Drug use. Emotional well-being. Home and relationship well-being. Sexual activity. Eating habits. Work and work Astronomer. Method of birth control. Menstrual cycle. Pregnancy history. Screening  You may have the following tests or measurements: Height, weight, and BMI. Blood pressure. Lipid and cholesterol levels. These may be checked every 5 years, or more frequently if you are over 85 years old. Skin check. Lung cancer screening. You may have this screening every year starting at age 35 if you have a 30-pack-year history of smoking and currently smoke or have quit within the past 15 years. Fecal occult blood test (FOBT) of the stool. You may have this test every year starting at age 53. Flexible sigmoidoscopy or colonoscopy. You may have a sigmoidoscopy every 5 years or a colonoscopy every 10 years starting at age 79. Hepatitis C blood test. Hepatitis B blood test. Sexually transmitted disease (STD) testing. Diabetes screening. This is done by checking your blood sugar (glucose) after you have not eaten for a while (fasting). You may  have this done every 1-3 years. Mammogram. This may be done every 1-2 years. Talk to your health care provider about when you should start having regular mammograms. This may depend on whether you have a family history of breast cancer. BRCA-related cancer screening. This may be done if you have a family history of breast, ovarian, tubal, or peritoneal cancers. Pelvic  exam and Pap test. This may be done every 3 years starting at age 29. Starting at age 74, this may be done every 5 years if you have a Pap test in combination with an HPV test. Bone density scan. This is done to screen for osteoporosis. You may have this scan if you are at high risk for osteoporosis. Discuss your test results, treatment options, and if necessary, the need for more tests with your health care provider. Vaccines  Your health care provider may recommend certain vaccines, such as: Influenza vaccine. This is recommended every year. Tetanus, diphtheria, and acellular pertussis (Tdap, Td) vaccine. You may need a Td booster every 10 years. Zoster vaccine. You may need this after age 38. Pneumococcal 13-valent conjugate (PCV13) vaccine. You may need this if you have certain conditions and were not previously vaccinated. Pneumococcal polysaccharide (PPSV23) vaccine. You may need one or two doses if you smoke cigarettes or if you have certain conditions. Talk to your health care provider about which screenings and vaccines you need and how often you need them. This information is not intended to replace advice given to you by your health care provider. Make sure you discuss any questions you have with your health care provider. Document Released: 06/27/2015 Document Revised: 02/18/2016 Document Reviewed: 04/01/2015 Elsevier Interactive Patient Education  2017 [employer].    Fall Prevention in the Home Falls can cause injuries. They can happen to people of all ages. There are many things you can do to make your home safe and to help prevent falls. What can I do on the outside of my home? Regularly fix the edges of walkways and driveways and fix any cracks. Remove anything that might make you trip as you walk through a door, such as a raised step or threshold. Trim any bushes or trees on the path to your home. Use bright outdoor lighting. Clear any walking paths of anything that might  make someone trip, such as rocks or tools. Regularly check to see if handrails are loose or broken. Make sure that both sides of any steps have handrails. Any raised decks and porches should have guardrails on the edges. Have any leaves, snow, or ice cleared regularly. Use sand or salt on walking paths during winter. Clean up any spills in your garage right away. This includes oil or grease spills. What can I do in the bathroom? Use night lights. Install grab bars by the toilet and in the tub and shower. Do not use towel bars as grab bars. Use non-skid mats or decals in the tub or shower. If you need to sit down in the shower, use a plastic, non-slip stool. Keep the floor dry. Clean up any water that spills on the floor as soon as it happens. Remove soap buildup in the tub or shower regularly. Attach bath mats securely with double-sided non-slip rug tape. Do not have throw rugs and other things on the floor that can make you trip. What can I do in the bedroom? Use night lights. Make sure that you have a light by your bed that is easy to reach. Do not  use any sheets or blankets that are too big for your bed. They should not hang down onto the floor. Have a firm chair that has side arms. You can use this for support while you get dressed. Do not have throw rugs and other things on the floor that can make you trip. What can I do in the kitchen? Clean up any spills right away. Avoid walking on wet floors. Keep items that you use a lot in easy-to-reach places. If you need to reach something above you, use a strong step stool that has a grab bar. Keep electrical cords out of the way. Do not use floor polish or wax that makes floors slippery. If you must use wax, use non-skid floor wax. Do not have throw rugs and other things on the floor that can make you trip. What can I do with my stairs? Do not leave any items on the stairs. Make sure that there are handrails on both sides of the stairs  and use them. Fix handrails that are broken or loose. Make sure that handrails are as long as the stairways. Check any carpeting to make sure that it is firmly attached to the stairs. Fix any carpet that is loose or worn. Avoid having throw rugs at the top or bottom of the stairs. If you do have throw rugs, attach them to the floor with carpet tape. Make sure that you have a light switch at the top of the stairs and the bottom of the stairs. If you do not have them, ask someone to add them for you. What else can I do to help prevent falls? Wear shoes that: Do not have high heels. Have rubber bottoms. Are comfortable and fit you well. Are closed at the toe. Do not wear sandals. If you use a stepladder: Make sure that it is fully opened. Do not climb a closed stepladder. Make sure that both sides of the stepladder are locked into place. Ask someone to hold it for you, if possible. Clearly mark and make sure that you can see: Any grab bars or handrails. First and last steps. Where the edge of each step is. Use tools that help you move around (mobility aids) if they are needed. These include: Canes. Walkers. Scooters. Crutches. Turn on the lights when you go into a dark area. Replace any light bulbs as soon as they burn out. Set up your furniture so you have a clear path. Avoid moving your furniture around. If any of your floors are uneven, fix them. If there are any pets around you, be aware of where they are. Review your medicines with your doctor. Some medicines can make you feel dizzy. This can increase your chance of falling. Ask your doctor what other things that you can do to help prevent falls. This information is not intended to replace advice given to you by your health care provider. Make sure you discuss any questions you have with your health care provider. Document Released: 03/27/2009 Document Revised: 11/06/2015 Document Reviewed: 07/05/2014 Elsevier Interactive Patient  Education  2017 [employer].

## 2022-10-23 NOTE — Progress Notes (Signed)
Subjective:   Karen Casey is a 67 y.o. female who presents for an Initial Medicare Annual Wellness Visit.  I connected with  [name] on 10/22/22 by a audio enabled telemedicine application and verified that I am speaking with the correct person using two identifiers.  Patient Location: Home  Provider Location: Home Office  I discussed the limitations of evaluation and management by telemedicine. The patient expressed understanding and agreed to proceed.  Review of Systems     Cardiac Risk Factors include: smoking/ tobacco exposure;hypertension;dyslipidemia;sedentary lifestyle     Objective:    Today's Vitals   10/22/22 1015  Weight: 134 lb (60.8 kg)  Height: 5\' 5"  (1.651 m)   Body mass index is 22.3 kg/m.     10/23/2022   10:12 PM 07/26/2022    2:35 AM 06/24/2022    9:49 PM 03/04/2022    9:21 AM 02/18/2022   10:36 AM 02/08/2022   10:11 AM 01/25/2022    3:23 PM  Advanced Directives  Does Patient Have a Medical Advance Directive? No No No No No No No  Would patient like information on creating a medical advance directive? Yes (MAU/Ambulatory/Procedural Areas - Information given)  No - Patient declined No - Patient declined  No - Patient declined No - Patient declined    Current Medications (verified) Outpatient Encounter Medications as of 10/22/2022  Medication Sig   amiodarone (PACERONE) 200 MG tablet Take 1 tablet (200 mg total) by mouth daily.   apixaban (ELIQUIS) 5 MG TABS tablet Take 1 tablet (5 mg total) by mouth 2 (two) times daily.   gabapentin (NEURONTIN) 800 MG tablet Take 1 tablet (800 mg total) by mouth 3 (three) times daily.   hydrOXYzine (ATARAX) 10 MG tablet TAKE 1 TABLET BY MOUTH THREE TIMES A DAY AS NEEDED FOR ANXIETY   QUEtiapine (SEROQUEL) 200 MG tablet TAKE 1 TABLET BY MOUTH EVERYDAY AT BEDTIME   albuterol (VENTOLIN HFA) 108 (90 Base) MCG/ACT inhaler Inhale 2 puffs into the lungs every 6 (six) hours as needed for wheezing or shortness of  breath. (Patient not taking: Reported on 07/26/2022)   Fluticasone-Umeclidin-Vilant (TRELEGY ELLIPTA) 100-62.5-25 MCG/ACT AEPB Inhale 2 puffs into the lungs daily. (Patient not taking: Reported on 06/11/2022)   nicotine (NICODERM CQ - DOSED IN MG/24 HOURS) 14 mg/24hr patch Place 14 mg onto the skin daily.   No facility-administered encounter medications on file as of 10/22/2022.    Allergies (verified) Capsaicin-menthol and Diclo gel [diclofenac sodium]   History: Past Medical History:  Diagnosis Date   Alcohol abuse    last use 03/09/21, marijuana last 03/09/21   Allergy    Anxiety    Cataract 06/09/2012   Right eye and left eye   COPD (chronic obstructive pulmonary disease) (HCC)    Depression    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/04/2020   Due to benzodiazepine withdrawal   Dyspnea    uses oxygen 2L via [place] prn   Enterococcal bacteremia 02/10/2022   GERD (gastroesophageal reflux disease)    Headache    Hypertension    Long term (current) use of anticoagulants    Nausea vomiting and diarrhea 02/09/2022   Neuromuscular disorder (HCC)    Neuropathy    Positive blood culture 02/10/2022   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   Seizure (HCC)    08/2020 per patient   Seizures (HCC)    xanax withdrawl- December 2013   Urinary incontinence 06/09/2012  Past Surgical History:  Procedure Laterality Date   APPENDECTOMY     BIOPSY  01/04/2021   Procedure: BIOPSY;  Surgeon: Benancio Deeds, MD;  Location: [facility] ENDOSCOPY;  Service: Gastroenterology;;   BIOPSY  03/12/2021   Procedure: BIOPSY;  Surgeon: Lemar Lofty., MD;  Location: [facility] ENDOSCOPY;  Service: Gastroenterology;;   CARDIOVERSION N/A 01/28/2022   Procedure: CARDIOVERSION;  Surgeon: Yates Decamp, MD;  Location: [facility] ENDOSCOPY;  Service: Cardiovascular;  Laterality: N/A;   CATARACT EXTRACTION  06/09/2012   Left eye   COLONOSCOPY WITH PROPOFOL N/A 03/12/2021   Procedure: COLONOSCOPY  WITH PROPOFOL;  Surgeon: Lemar Lofty., MD;  Location: [facility] ENDOSCOPY;  Service: Gastroenterology;  Laterality: N/A;   ESOPHAGOGASTRODUODENOSCOPY (EGD) WITH PROPOFOL N/A 01/04/2021   Procedure: ESOPHAGOGASTRODUODENOSCOPY (EGD) WITH PROPOFOL;  Surgeon: Benancio Deeds, MD;  Location: [facility] ENDOSCOPY;  Service: Gastroenterology;  Laterality: N/A;   left shoulder dislocation  Sept 2011   POLYPECTOMY  03/12/2021   Procedure: POLYPECTOMY;  Surgeon: Mansouraty, Netty Starring., MD;  Location: [facility] ENDOSCOPY;  Service: Gastroenterology;;   Family History  Problem Relation Age of Onset   Hypertension Mother    Hyperlipidemia Mother    Aneurysm Mother        Rupture - Cause of death   Heart disease Father        MI - cause of death   Depression Father    Parkinsonism Father    Hypertension Sister    Colon cancer Neg Hx    Esophageal cancer Neg Hx    Rectal cancer Neg Hx    Stomach cancer Neg Hx    Social History   Socioeconomic History   Marital status: Single    Spouse name: Not on file   Number of children: 1   Years of education: Not on file   Highest education level: Not on file  Occupational History   Not on file  Tobacco Use   Smoking status: Every Day    Packs/day: 0.50    Years: 43.00    Additional pack years: 0.00    Total pack years: 21.50    Types: Cigarettes   Smokeless tobacco: Never   Tobacco comments:    Tobacco info given  Vaping Use   Vaping Use: Never used  Substance and Sexual Activity   Alcohol use: Yes    Alcohol/week: 3.0 standard drinks of alcohol    Types: 3 Cans of beer per week    Comment: 1-2 times week just beer   Drug use: Yes    Types: Marijuana, Other-see comments    Comment: Past hx of benzo abuse--xanax   Sexual activity: Not on file  Other Topics Concern   Not on file  Social History Narrative   Not on file   Social Determinants of Health   Financial Resource Strain: Medium Risk (10/23/2022)   Overall Financial Resource Strain  (CARDIA)    Difficulty of Paying Living Expenses: Somewhat hard  Food Insecurity: No Food Insecurity (10/23/2022)   Hunger Vital Sign    Worried About Running Out of Food in the Last Year: Never true    Ran Out of Food in the Last Year: Never true  Transportation Needs: No Transportation Needs (10/23/2022)   PRAPARE - Administrator, Civil Service (Medical): No    Lack of Transportation (Non-Medical): No  Physical Activity: Inactive (10/23/2022)   Exercise Vital Sign    Days of Exercise per Week: 0 days    Minutes of Exercise  per Session: 0 min  Stress: Stress Concern Present (10/23/2022)   [employer] of Occupational Health - Occupational Stress Questionnaire    Feeling of Stress : Very much  Social Connections: Socially Isolated (10/23/2022)   Social Connection and Isolation Panel [NHANES]    Frequency of Communication with Friends and Family: Three times a week    Frequency of Social Gatherings with Friends and Family: Once a week    Attends Religious Services: Never    Database administrator or Organizations: No    Attends Banker Meetings: Never    Marital Status: Widowed    Tobacco Counseling Ready to quit: Not Answered Counseling given: Not Answered Tobacco comments: Tobacco info given   Clinical Intake:  Pre-visit preparation completed: Yes  Pain : No/denies pain  Diabetes: No  How often do you need to have someone help you when you read instructions, pamphlets, or other written materials from your doctor or pharmacy?: 1 - Never  Diabetic?No   Interpreter Needed?: No  Information entered by :: Kandis Fantasia LPN   Activities of Daily Living    10/23/2022   10:12 PM 01/02/2022    5:18 PM  In your present state of health, do you have any difficulty performing the following activities:  Hearing? 0   Vision? 0   Difficulty concentrating or making decisions? 1   Walking or climbing stairs? 0   Dressing or bathing? 0   Doing  errands, shopping? 0 1  Preparing Food and eating ? N   Using the Toilet? N   In the past six months, have you accidently leaked urine? N   Do you have problems with loss of bowel control? N   Managing your Medications? N   Managing your Finances? N   Housekeeping or managing your Housekeeping? N     Patient Care Team: Billey Co, MD as PCP - General (Family Medicine)  Indicate any recent Medical Services you may have received from other than [facility] providers in the past year (date may be approximate).     Assessment:   This is a routine wellness examination for Karen Casey.  Hearing/Vision screen Hearing Screening - Comments:: Denies hearing difficulties   Vision Screening - Comments:: No vision problems; will schedule routine eye exam soon (referral requested)  Dietary issues and exercise activities discussed: Current Exercise Habits: The patient does not participate in regular exercise at present   Goals Addressed             This Visit's Progress    COMPLETED: Care Coordination        SW Collaborated with CareTeam. Fl2 is completed and located in media files.   Patient has been made aware.  Nothing further needed at this time.     Seek counseling        Depression Screen    10/23/2022   10:08 PM 06/11/2022    2:55 PM 03/04/2022    9:17 AM 02/18/2022   10:36 AM 10/22/2021   10:49 AM 09/23/2021   10:46 AM 08/28/2021   10:07 AM  PHQ 2/9 Scores  PHQ - 2 Score 6 6 4 4 5 4 6   PHQ- 9 Score 19 21 18 20 22 17 21     Fall Risk    10/23/2022   10:11 PM 03/04/2022    9:22 AM 09/23/2021   10:39 AM 08/07/2021   10:02 AM 12/05/2020    8:51 AM  Fall Risk   Falls in the past year?  1 0 1 1 1   Number falls in past yr: 0  1 1 1   Injury with Fall? 1   0 1  Risk for fall due to : History of fall(s);Impaired balance/gait;Impaired mobility   History of fall(s);Impaired balance/gait Medication side effect;Mental status change  Follow up Falls prevention discussed;Education  provided;Falls evaluation completed    Falls prevention discussed    FALL RISK PREVENTION PERTAINING TO THE HOME:  Any stairs in or around the home? No  If so, are there any without handrails? No  Home free of loose throw rugs in walkways, pet beds, electrical cords, etc? Yes  Adequate lighting in your home to reduce risk of falls? Yes   ASSISTIVE DEVICES UTILIZED TO PREVENT FALLS:  Life alert? No  Use of a cane, walker or w/c? No  Grab bars in the bathroom? Yes  Shower chair or bench in shower? No  Elevated toilet seat or a handicapped toilet? Yes   TIMED UP AND GO:  Was the test performed? No . Telephonic visit   Cognitive Function:        10/23/2022   10:12 PM  6CIT Screen  What Year? 0 points  What month? 0 points  What time? 0 points  Count back from 20 0 points  Months in reverse 2 points  Repeat phrase 4 points  Total Score 6 points    Immunizations Immunization History  Administered Date(s) Administered   Influenza Split 06/22/2012, 03/29/2013, 08/23/2017   Influenza,inj,Quad PF,6+ Mos 03/29/2013, 08/23/2017, 06/20/2020, 02/27/2021, 03/04/2022   PFIZER(Purple Top)SARS-COV-2 Vaccination 11/23/2019, 12/17/2019, 06/19/2020   PNEUMOCOCCAL CONJUGATE-20 02/27/2021   Pfizer Covid-19 Vaccine Bivalent Booster 23yrs & up 03/24/2021   Tdap 04/17/2021    TDAP status: Up to date  Flu Vaccine status: Up to date  Pneumococcal vaccine status: Up to date  Covid-19 vaccine status: Information provided on how to obtain vaccines.   Qualifies for Shingles Vaccine? Yes   Zostavax completed No   Shingrix Completed?: No.    Education has been provided regarding the importance of this vaccine. Patient has been advised to call insurance company to determine out of pocket expense if they have not yet received this vaccine. Advised may also receive vaccine at local pharmacy or Health Dept. Verbalized acceptance and understanding.  Screening Tests Health Maintenance  Topic  Date Due   Zoster Vaccines- Shingrix (1 of 2) Never done   COVID-19 Vaccine (5 - 2023-24 season) 02/12/2022   PAP SMEAR-Modifier  08/24/2022   Lung Cancer Screening  11/23/2022   INFLUENZA VACCINE  01/13/2023   MAMMOGRAM  02/05/2023   COLONOSCOPY (Pts 45-67yrs Insurance coverage will need to be confirmed)  03/12/2024   DTaP/Tdap/Td (2 - Td or Tdap) 04/18/2031   Hepatitis C Screening  Completed   HIV Screening  Completed   HPV VACCINES  Aged Out    Health Maintenance  Health Maintenance Due  Topic Date Due   Zoster Vaccines- Shingrix (1 of 2) Never done   COVID-19 Vaccine (5 - 2023-24 season) 02/12/2022   PAP SMEAR-Modifier  08/24/2022   Lung Cancer Screening  11/23/2022    Colorectal cancer screening: Type of screening: Colonoscopy. Completed 03/12/21. Repeat every 3 years  Mammogram status: Ordered today. Pt provided with contact info and advised to call to schedule appt.   Bone Density status: Completed 05/12/21. Results reflect: Bone density results: OSTEOPOROSIS. Repeat every 2 years.  Lung Cancer Screening: (Low Dose CT Chest recommended if Age 37-80 years, 30 pack-year currently smoking OR  have quit w/in 15years.) does qualify.   Lung Cancer Screening Referral: last 11/22/21  Additional Screening:  Hepatitis C Screening: does qualify; Completed 11/20/21  Vision Screening: Recommended annual ophthalmology exams for early detection of glaucoma and other disorders of the eye. Is the patient up to date with their annual eye exam?  No  Who is the provider or what is the name of the office in which the patient attends annual eye exams? none If pt is not established with a provider, would they like to be referred to a provider to establish care? Yes .   Dental Screening: Recommended annual dental exams for proper oral hygiene  Community Resource Referral / Chronic Care Management: CRR required this visit?  No   CCM required this visit?  No      Plan:     I have  personally reviewed and noted the following in the patient's chart:   Medical and social history Use of alcohol, tobacco or illicit drugs  Current medications and supplements including opioid prescriptions. Patient is not currently taking opioid prescriptions. Functional ability and status Nutritional status Physical activity Advanced directives List of other physicians Hospitalizations, surgeries, and ER visits in previous 12 months Vitals Screenings to include cognitive, depression, and falls Referrals and appointments  In addition, I have reviewed and discussed with patient certain preventive protocols, quality metrics, and best practice recommendations. A written personalized care plan for preventive services as well as general preventive health recommendations were provided to patient.     Durwin Nora, [place]   1/61/0960   Due to this being a virtual visit, the after visit summary with patients personalized plan was offered to patient via mail or my-chart.  per request, patient was mailed a copy of AVS.  Nurse Notes: Patient would like referral to psych for grief and depression.

## 2022-10-25 NOTE — Progress Notes (Signed)
SUBJECTIVE:   CHIEF COMPLAINT / HPI:   Osteoporosis- discussed recommended dietary calcium and vitamin D levels. 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 She is open to doing Prolia at our clinic.   Paroxysmal Afib- on amiodarone, only had samples of Eliquis and was not able to pick up at pharmacy, would like me to try again now that she has new insurance. Has been taking once daily instead of twice daily. No signs or symptoms of bleeding.  GERD- on nexium 40mg  not helping, wanting to try something else. Hasn't had a drink in 3-4weeks. No diarrhea, vomiting, blood in stools. Some epigastric pain after eating. Still tolerated baked potatoes, salads. Has gained some weight.  Anemia- in hospital had anemia, Hgb 9.5, she denies signs or symptoms of bleeding, is ok with checking lab work for this.  Hyponatremia- at last hospitalization they told her she was drinking too much water at home, so limiting to 2 bottles per day. She denies confusion and notes "my memory is still sharp."  Anxiety and depression- open to therapy resources. Still tearful about Dorinda Hill who passed away 5 months ago, wakes up thinking about him. 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 Does not want to see previous psychiatrist.   COPD- has been out of inhalers for some time, would like refill.  PERTINENT  PMH / PSH: HTN, paroxsymal Afib, COPD, GERD, h/o SIADH, peripheral neuropathy, h/o thoracic vertebral compression fracture, depression/anxiety, anemia  OBJECTIVE:   BP 118/62   Pulse 82   Ht 5\' 5"  (1.651 m)   Wt 140 lb 3.2 oz (63.6 kg)   SpO2 98%   BMI 23.33 kg/m   General: A&O, NAD, well groomed in sunflower  shirt HEENT: No sign of trauma, EOM grossly intact Cardiac: RRR, no m/r/g Respiratory: CTAB, normal WOB, no w/c/r GI: Soft, mild epigastric TTP, no guarding or rebound, + bowel sounds,  non-distended  Extremities: NTTP, no peripheral edema. Neuro: Normal gait, moves all four extremities appropriately, walks with walker Psych: tearful at times when discussing Dorinda Hill, but appropriate   ASSESSMENT/PLAN:   Paroxysmal atrial fibrillation (HCC) Eliquis samples x1 mo given today, new script sent, she is to call if not affordable Continue amiodarone  COPD (chronic obstructive pulmonary disease) (HCC) Inhalers refilled, no signs or symptoms of exacerbation When asked if she is still smoking "you wont take that away from me" - precontemplative phase CT chest without nodules in June 2023, will offer repeat low dose CT in June 2024 for lung cancer screening  Alcohol use Remains abstinent, congratulated patient, discussed importance of continuing this  Osteoporosis Likely not a great candidate for bisphosphenates due to severe GERD and dentition putting at risk for IV reclast infusion related osteonecrosis of jaw Open to Prolia injection, checking Ca and Vit D today F/u scheduled with Dr Raymondo Band to start this  GERD (gastroesophageal reflux disease) Will trial switch of PPI with Protonix daily, has appt with GI in May to discuss other options  Depression With anxiety as well, due to SIADH and continue issues with sodium we are limited in use of medication, no wellbutrin with h/o seizures Continue qHS seroquel, increased PRN hydroxyzine to 25-50 mg range  tablets Overall seems improved from previous visits but we discussed importance of therapy and resources given to check with her insurance provider  Anemia Will check iron, B12, folate levels today and trend CBC No signs/symptoms of bleeding but high risk for nutrient deficiences  Hyponatremia Will recheck BMP today, she is drinking less and  eating well   Scheduled for 1 mo f/u for pap smear (hopefully last one!)  Total of 44 minutes spent face to face and delivering patient care.   Billey Co, MD [facility]

## 2022-10-29 ENCOUNTER — Encounter: Payer: Self-pay | Admitting: Family Medicine

## 2022-10-29 ENCOUNTER — Ambulatory Visit (INDEPENDENT_AMBULATORY_CARE_PROVIDER_SITE_OTHER): Payer: Medicare HMO | Admitting: Family Medicine

## 2022-10-29 VITALS — BP 118/62 | HR 82 | Ht 65.0 in | Wt 140.2 lb

## 2022-10-29 DIAGNOSIS — I480 Paroxysmal atrial fibrillation: Secondary | ICD-10-CM

## 2022-10-29 DIAGNOSIS — D649 Anemia, unspecified: Secondary | ICD-10-CM

## 2022-10-29 DIAGNOSIS — F419 Anxiety disorder, unspecified: Secondary | ICD-10-CM

## 2022-10-29 DIAGNOSIS — I4891 Unspecified atrial fibrillation: Secondary | ICD-10-CM

## 2022-10-29 DIAGNOSIS — J438 Other emphysema: Secondary | ICD-10-CM

## 2022-10-29 DIAGNOSIS — E871 Hypo-osmolality and hyponatremia: Secondary | ICD-10-CM

## 2022-10-29 DIAGNOSIS — Z789 Other specified health status: Secondary | ICD-10-CM

## 2022-10-29 DIAGNOSIS — M818 Other osteoporosis without current pathological fracture: Secondary | ICD-10-CM

## 2022-10-29 DIAGNOSIS — F1090 Alcohol use, unspecified, uncomplicated: Secondary | ICD-10-CM

## 2022-10-29 DIAGNOSIS — F3289 Other specified depressive episodes: Secondary | ICD-10-CM

## 2022-10-29 DIAGNOSIS — K219 Gastro-esophageal reflux disease without esophagitis: Secondary | ICD-10-CM

## 2022-10-29 MED ORDER — APIXABAN 5 MG PO TABS: 5.0000 mg | ORAL_TABLET | Freq: Two times a day (BID) | ORAL | 0 refills | Status: DC

## 2022-10-29 MED ORDER — ALBUTEROL SULFATE HFA 108 (90 BASE) MCG/ACT IN AERS: 2.0000 | INHALATION_SPRAY | Freq: Four times a day (QID) | RESPIRATORY_TRACT | 2 refills | Status: DC | PRN

## 2022-10-29 MED ORDER — PANTOPRAZOLE SODIUM 40 MG PO TBEC: 40.0000 mg | DELAYED_RELEASE_TABLET | Freq: Every day | ORAL | 4 refills | Status: DC

## 2022-10-29 MED ORDER — HYDROXYZINE HCL 50 MG PO TABS: 50.0000 mg | ORAL_TABLET | Freq: Three times a day (TID) | ORAL | 3 refills | Status: DC | PRN

## 2022-10-29 MED ORDER — APIXABAN 5 MG PO TABS: 5.0000 mg | ORAL_TABLET | Freq: Two times a day (BID) | ORAL | 12 refills | Status: DC

## 2022-10-29 MED ORDER — TRELEGY ELLIPTA 100-62.5-25 MCG/ACT IN AEPB: 1.0000 | INHALATION_SPRAY | Freq: Every day | RESPIRATORY_TRACT | 1 refills | Status: DC

## 2022-10-29 NOTE — Assessment & Plan Note (Signed)
Remains abstinent, congratulated patient, discussed importance of continuing this

## 2022-10-29 NOTE — Assessment & Plan Note (Signed)
Will trial switch of PPI with Protonix daily, has appt with GI in May to discuss other options

## 2022-10-29 NOTE — Assessment & Plan Note (Signed)
With anxiety as well, due to SIADH and continue issues with sodium we are limited in use of medication, no wellbutrin with h/o seizures Continue qHS seroquel, increased PRN hydroxyzine to 25-50 mg range tablets Overall seems improved from previous visits but we discussed importance of therapy and resources given to check with her insurance provider

## 2022-10-29 NOTE — Assessment & Plan Note (Signed)
Will recheck BMP today, she is drinking less and eating well

## 2022-10-29 NOTE — Assessment & Plan Note (Signed)
Eliquis samples x1 mo given today, new script sent, she is to call if not affordable Continue amiodarone

## 2022-10-29 NOTE — Assessment & Plan Note (Signed)
Will check iron, B12, folate levels today and trend CBC No signs/symptoms of bleeding but high risk for nutrient deficiences

## 2022-10-29 NOTE — Assessment & Plan Note (Signed)
Inhalers refilled, no signs or symptoms of exacerbation When asked if she is still smoking "you wont take that away from me" - precontemplative phase CT chest without nodules in June 2023, will offer repeat low dose CT in June 2024 for lung cancer screening

## 2022-10-29 NOTE — Patient Instructions (Addendum)
It was wonderful to see you today.  Please bring ALL of your medications with you to every visit.   Today we talked about:  We will check lab work today.  I refilled your inhalers.  I increased your hydroxyzine as needed for anxiety, 25mg  tablets, you can take 1-2 tabs up to three times per day. They can make you sleepy.  For information on therapists, please go to [email or web address]. You can also contact your insurance company to find an in-network therapist.   For your osteoporosis, You should take 1200 mg daily of calcium and 800 IU Vitamin D daily. We have set you up to do prolia injections at our clinic.   Thank you for choosing [facility].   Please call [phone] with any questions about today's appointment.  Please arrive at least 15 minutes prior to your scheduled appointments.   If you had blood work today, I will send you a MyChart message or a letter if results are normal. Otherwise, I will give you a call.   If you had a referral placed, they will call you to set up an appointment. Please give [place] a call if you don't hear back in the next 2 weeks.   If you need additional refills before your next appointment, please call your pharmacy first.   Burley Saver, MD  Family Medicine

## 2022-10-29 NOTE — Assessment & Plan Note (Addendum)
Likely not a great candidate for bisphosphenates due to severe GERD and dentition putting at risk for IV reclast infusion related osteonecrosis of jaw Open to Prolia injection, checking Ca and Vit D today F/u scheduled with Dr Raymondo Band to start this

## 2022-10-30 LAB — ANEMIA PROFILE B
Basophils Absolute: 0 10*3/uL (ref 0.0–0.2)
Basos: 1 %
EOS (ABSOLUTE): 0.1 10*3/uL (ref 0.0–0.4)
Eos: 2 %
Ferritin: 7 ng/mL — ABNORMAL LOW (ref 15–150)
Folate: 7.1 ng/mL (ref >3.0)
Hematocrit: 28.8 % — ABNORMAL LOW (ref 34.0–46.6)
Hemoglobin: 8.7 g/dL — ABNORMAL LOW (ref 11.1–15.9)
Immature Grans (Abs): 0 10*3/uL (ref 0.0–0.1)
Immature Granulocytes: 0 %
Iron Saturation: 3 % — CL (ref 15–55)
Iron: 15 ug/dL — ABNORMAL LOW (ref 27–139)
Lymphocytes Absolute: 1.3 10*3/uL (ref 0.7–3.1)
Lymphs: 23 %
MCH: 22.1 pg — ABNORMAL LOW (ref 26.6–33.0)
MCHC: 30.2 g/dL — ABNORMAL LOW (ref 31.5–35.7)
MCV: 73 fL — ABNORMAL LOW (ref 79–97)
Monocytes Absolute: 0.5 10*3/uL (ref 0.1–0.9)
Monocytes: 9 %
Neutrophils Absolute: 3.6 10*3/uL (ref 1.4–7.0)
Neutrophils: 65 %
Platelets: 231 10*3/uL (ref 150–450)
RBC: 3.93 x10E6/uL (ref 3.77–5.28)
RDW: 16.1 % — ABNORMAL HIGH (ref 11.7–15.4)
Retic Ct Pct: 1.1 % (ref 0.6–2.6)
Total Iron Binding Capacity: 436 ug/dL (ref 250–450)
UIBC: 421 ug/dL — ABNORMAL HIGH (ref 118–369)
Vitamin B-12: 245 pg/mL (ref 232–1245)
WBC: 5.6 10*3/uL (ref 3.4–10.8)

## 2022-10-30 LAB — COMPREHENSIVE METABOLIC PANEL
ALT: 9 IU/L (ref 0–32)
AST: 15 IU/L (ref 0–40)
Albumin/Globulin Ratio: 1.9 (ref 1.2–2.2)
Albumin: 4.5 g/dL (ref 3.9–4.9)
Alkaline Phosphatase: 121 IU/L (ref 44–121)
BUN/Creatinine Ratio: 16 (ref 12–28)
BUN: 12 mg/dL (ref 8–27)
Bilirubin Total: <0.2 mg/dL (ref 0.0–1.2)
CO2: 23 mmol/L (ref 20–29)
Calcium: 9.4 mg/dL (ref 8.7–10.3)
Chloride: 101 mmol/L (ref 96–106)
Creatinine, Ser: 0.74 mg/dL (ref 0.57–1.00)
Globulin, Total: 2.4 g/dL (ref 1.5–4.5)
Glucose: 103 mg/dL — ABNORMAL HIGH (ref 70–99)
Potassium: 4.5 mmol/L (ref 3.5–5.2)
Sodium: 140 mmol/L (ref 134–144)
Total Protein: 6.9 g/dL (ref 6.0–8.5)
eGFR: 90 mL/min/{1.73_m2} (ref >59)

## 2022-10-30 LAB — VITAMIN D 25 HYDROXY (VIT D DEFICIENCY, FRACTURES): Vit D, 25-Hydroxy: 11.2 ng/mL — ABNORMAL LOW (ref 30.0–100.0)

## 2022-11-01 ENCOUNTER — Telehealth: Payer: Self-pay | Admitting: Family Medicine

## 2022-11-01 DIAGNOSIS — E559 Vitamin D deficiency, unspecified: Secondary | ICD-10-CM

## 2022-11-01 DIAGNOSIS — D508 Other iron deficiency anemias: Secondary | ICD-10-CM

## 2022-11-01 MED ORDER — VITAMIN D (ERGOCALCIFEROL) 1.25 MG (50000 UNIT) PO CAPS: 50000.0000 [IU] | ORAL_CAPSULE | ORAL | 0 refills | Status: DC

## 2022-11-01 MED ORDER — FERROUS SULFATE 325 (65 FE) MG PO TABS: 325.0000 mg | ORAL_TABLET | ORAL | 1 refills | Status: DC

## 2022-11-01 NOTE — Telephone Encounter (Signed)
Late entry- called aptient 10/30/22 at 10:20 am when lab work notable for Hgb 8.7. She denies any blood in urine or stool, no vaginal bleeding, hematochezia or melena. Discussed these would be reason to call office immediately. Discussed lab work c/w with IDA, ? Nutritional deficiency due to limited diet vs GI source (? Hemorrhoids noted on colonoscopy in 02/2021 with one polyp removed and plan for repeat colonoscopy in 3 years) . No signs of acute bleed and Hgb was 9.5 in February. Will start PO ferrous sulfate every other day and repeat in 4-6 weeks, may need IV iron if no response. Strict return precautions discussed.   Also discussed low vit D noted, will do 50K IU weekly x 8 weeks and then 800 IU daily after that. CMP unremarkable.  Medications sent to pharmacy today.

## 2022-11-02 ENCOUNTER — Ambulatory Visit: Payer: Medicare HMO | Admitting: Pharmacist

## 2022-11-04 ENCOUNTER — Ambulatory Visit: Payer: Medicare HMO | Admitting: Gastroenterology

## 2022-11-04 ENCOUNTER — Other Ambulatory Visit: Payer: Self-pay | Admitting: Family Medicine

## 2022-11-04 DIAGNOSIS — F32A Depression, unspecified: Secondary | ICD-10-CM

## 2022-11-11 ENCOUNTER — Encounter: Payer: Self-pay | Admitting: Pharmacist

## 2022-11-11 ENCOUNTER — Ambulatory Visit (INDEPENDENT_AMBULATORY_CARE_PROVIDER_SITE_OTHER): Payer: Medicare HMO | Admitting: Pharmacist

## 2022-11-11 VITALS — BP 109/83 | HR 96 | Ht 64.0 in | Wt 141.6 lb

## 2022-11-11 DIAGNOSIS — Z720 Tobacco use: Secondary | ICD-10-CM

## 2022-11-11 DIAGNOSIS — M8000XD Age-related osteoporosis with current pathological fracture, unspecified site, subsequent encounter for fracture with routine healing: Secondary | ICD-10-CM | POA: Diagnosis not present

## 2022-11-11 NOTE — Assessment & Plan Note (Signed)
Tobacco use disorder of >50 years. Currently smoking 1/2 PPD. Patient willing to discuss intake reduction. Not currently using pharmacotherapy. Currently dealing with the grief of losing her husband recently in the last 5 months (married for >30 years). Set goal of reducing to 5 cigarettes/day then discuss future goals.

## 2022-11-11 NOTE — Assessment & Plan Note (Signed)
1.  Start  Prolia (denosumab) 60 mg every 6 months. Will work with patient Surveyor, minerals to determine insurance coverage.  2.  recommend calcium 1200mg  daily through supplementation or diet.  3.  recommend weight bearing exercise - 30 minutes at least 4 days per week.   4.  Counseled and educated about fall risk and prevention. -Patient educated on purpose, proper use and potential adverse effects of osteonecrosis of the jaw.  Following instruction patient verbalized understanding of treatment plan.

## 2022-11-11 NOTE — Progress Notes (Signed)
    Current Height: 5'4"       Max Lifetime Height:  5'6" Current Weight: 163 lbs        Ethnicity:Caucasian  BP:  109/83     HR:  96       HPI: Does pt already have a diagnosis of:  Osteoporosis?  Yes  Back Pain?  Yes       Kyphosis?  Yes Prior fracture?  Yes - Compression fracture of body of thoracic vertebra  Med(s) for Osteoporosis/Osteopenia:  none Med(s) previously tried for Osteoporosis/Osteopenia:  none                                                             PMH: Age at menopause:  early 42s Hysterectomy?  No Oophorectomy?  No HRT? No Steroid Use?  No Thyroid med?  No History of cancer?  No History of digestive disorders (ie Crohn's)?  No Current or previous eating disorders?  No Last Vitamin D Result:  11.14 Oct 2022 Last GFR Result:  63   FH/SH: Family history of osteoporosis?  No Parent with history of hip fracture?  Yes - father fell 2/2 parkinson Family history of breast cancer?  No Exercise?  Yes - 4-5x/week  Smoking?  Yes - 1/2 PPD, started at 67 YO, quit when she was pregnant  Alcohol?  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     Calcium Assessment Calcium Intake  # of servings/day  Calcium mg  Milk (8 oz) 0  x  300  = 0  Yogurt (4 oz) 0 x  200 = 0  Cheese (1 oz) 1 x  200 = 200  Other Calcium sources   250mg   Ca supplement 0 = 0   Estimated calcium intake per day 450 mg    DEXA Results Date of Test T-Score for Left Forearm T-Score for Left Femur  05/02/2021 -3.6 -2.5   Assessment: Osteoporosis based on DEXA scan.   Recommendations: 1.  Start  Prolia (denosumab) 60 mg every 6 months. Will work with patient Surveyor, minerals to determine insurance coverage.  2.  recommend calcium 1200mg  daily through supplementation or diet.  3.  recommend weight bearing exercise - 30 minutes at least 4 days per week.   4.  Counseled and educated about fall risk and prevention. -Patient educated on purpose, proper use and potential adverse  effects of osteonecrosis of the jaw.  Following instruction patient verbalized understanding of treatment plan.    Tobacco use disorder of >50 years. Currently smoking 1/2 PPD. Patient willing to discuss intake reduction. Not currently using pharmacotherapy. Currently dealing with the grief of losing her husband recently in the last 5 months (married for >30 years). Set goal of reducing to 5 cigarettes/day then discuss future goals.   Recheck DEXA:  2 years  Time spent counseling patient:  30 minutes  Follow up: Pharmacist: schedule appointment for Prolia administration once approved PCP: 12/03/2022 Patient seen with Haze Boyden PharmD Candidate and Valeda Malm, PharmD, PGY2 Pharmacy Resident.

## 2022-11-11 NOTE — Patient Instructions (Addendum)
It was nice seeing you today! We will work with our patient advocate about getting the Prolia injection approved.   Start 1200 mg/ day of Calcium Citrate Take no more than 600mg  of calcium at one time.  Continue your Vitamin D once weekly.   Do weight bearing exercise: Examples are: Yoga, Brisk Walking, Golf, and Lifting weights,  using the weight machines at your health club  If you are a smoker, Smoking Cessation important  REPEAT DEXA in 2 YEARS

## 2022-11-12 ENCOUNTER — Other Ambulatory Visit (HOSPITAL_COMMUNITY): Payer: Self-pay

## 2022-11-12 NOTE — Progress Notes (Signed)
Reviewed and agree with Dr Koval's plan.   

## 2022-11-15 ENCOUNTER — Telehealth: Payer: Self-pay

## 2022-11-15 NOTE — Telephone Encounter (Signed)
Agree with ED Evaluation. Can discuss sleep aid pending ED results at follow up appt.

## 2022-11-15 NOTE — Telephone Encounter (Signed)
Patient calls nurse line regarding potential side effects to Seroquel. She states that she took increased dosage at 3:30 this AM. She states that this was 50 mg.   Advised that Dr. Miquel Dunn had increased the hydroxyzine dosage to 50 mg and that Seroquel was 200 mg.   She states that she is certain that she took Seroquel. She reports that since taking medication she is having severe chest pain, heart is "beating fast", anxiety, and changes in her speech. Her speech does not sound slurred at this time.   Advised that due to current symptoms, she would need ED evaluation. Offered to call ambulance for patient, she declines. Patient reports that her sister can drive her to be seen.   Patient is also asking for an alternative sleeping medication.   Will forward to PCP.   Veronda Prude, RN

## 2022-11-16 ENCOUNTER — Other Ambulatory Visit (HOSPITAL_COMMUNITY): Payer: Self-pay

## 2022-11-16 ENCOUNTER — Other Ambulatory Visit: Payer: Self-pay | Admitting: Family Medicine

## 2022-11-16 ENCOUNTER — Telehealth: Payer: Self-pay | Admitting: Family Medicine

## 2022-11-16 ENCOUNTER — Other Ambulatory Visit: Payer: Self-pay

## 2022-11-16 DIAGNOSIS — M818 Other osteoporosis without current pathological fracture: Secondary | ICD-10-CM

## 2022-11-16 MED ORDER — DENOSUMAB 60 MG/ML ~~LOC~~ SOSY
60.0000 mg | PREFILLED_SYRINGE | SUBCUTANEOUS | 1 refills | Status: DC
  Filled 2022-11-16 – 2022-11-17 (×2): qty 1, 180d supply, fill #0

## 2022-11-16 NOTE — Telephone Encounter (Signed)
Called patient and verified I was speaking with Karen Casey.  Discussed co-pay of $95 for Prolia q6 months, which she notes after pay day she will be fine with.   Discussed need for BMP before injection to check her calcium, which she is in agreement with. BMP future ordered.   Prolia sent to [facility] pharmacy to be couriered to [facility], once arrived she will be contacted to set up appt and will need to come in 1 week prior to appt to get BMP drawn.  Answered all questions. Burley Saver MD

## 2022-11-17 ENCOUNTER — Other Ambulatory Visit (HOSPITAL_COMMUNITY): Payer: Self-pay

## 2022-11-17 ENCOUNTER — Other Ambulatory Visit: Payer: Self-pay

## 2022-11-18 ENCOUNTER — Other Ambulatory Visit: Payer: Self-pay | Admitting: Family Medicine

## 2022-11-18 DIAGNOSIS — R1310 Dysphagia, unspecified: Secondary | ICD-10-CM

## 2022-11-18 DIAGNOSIS — F419 Anxiety disorder, unspecified: Secondary | ICD-10-CM

## 2022-11-19 NOTE — Telephone Encounter (Signed)
Refill request for gabapentin 600 mg On medication list is on 800 - Rx given in December with 12 refills  Therefore denied

## 2022-11-22 ENCOUNTER — Telehealth: Payer: Self-pay | Admitting: Family Medicine

## 2022-11-22 NOTE — Telephone Encounter (Signed)
Called and left VM discussing pt is due for CT lung screening. Left clinic number to call back to discuss.  Burley Saver MD

## 2022-11-23 ENCOUNTER — Other Ambulatory Visit (HOSPITAL_COMMUNITY): Payer: Self-pay

## 2022-11-26 ENCOUNTER — Other Ambulatory Visit (HOSPITAL_COMMUNITY): Payer: Self-pay

## 2022-11-27 ENCOUNTER — Other Ambulatory Visit: Payer: Self-pay

## 2022-11-27 ENCOUNTER — Ambulatory Visit (HOSPITAL_COMMUNITY)
Admission: EM | Admit: 2022-11-27 | Discharge: 2022-11-28 | Disposition: A | Discharge status: Other Acute Inpatient Hospital | Payer: Medicare HMO | Attending: Urology | Admitting: Urology

## 2022-11-27 ENCOUNTER — Encounter (HOSPITAL_COMMUNITY): Payer: Self-pay | Admitting: Psychiatry

## 2022-11-27 DIAGNOSIS — F419 Anxiety disorder, unspecified: Secondary | ICD-10-CM | POA: Insufficient documentation

## 2022-11-27 DIAGNOSIS — F1290 Cannabis use, unspecified, uncomplicated: Secondary | ICD-10-CM | POA: Diagnosis not present

## 2022-11-27 DIAGNOSIS — R45851 Suicidal ideations: Secondary | ICD-10-CM | POA: Diagnosis not present

## 2022-11-27 DIAGNOSIS — E1140 Type 2 diabetes mellitus with diabetic neuropathy, unspecified: Secondary | ICD-10-CM | POA: Diagnosis not present

## 2022-11-27 DIAGNOSIS — R262 Difficulty in walking, not elsewhere classified: Secondary | ICD-10-CM | POA: Diagnosis not present

## 2022-11-27 DIAGNOSIS — F332 Major depressive disorder, recurrent severe without psychotic features: Secondary | ICD-10-CM | POA: Diagnosis not present

## 2022-11-27 DIAGNOSIS — F1021 Alcohol dependence, in remission: Secondary | ICD-10-CM | POA: Diagnosis not present

## 2022-11-27 DIAGNOSIS — R454 Irritability and anger: Secondary | ICD-10-CM | POA: Diagnosis not present

## 2022-11-27 LAB — POCT URINE DRUG SCREEN - MANUAL ENTRY (I-SCREEN)
POC Amphetamine UR: NOT DETECTED
POC Buprenorphine (BUP): NOT DETECTED
POC Cocaine UR: NOT DETECTED
POC Marijuana UR: NOT DETECTED
POC Methadone UR: NOT DETECTED
POC Methamphetamine UR: NOT DETECTED
POC Morphine: NOT DETECTED
POC Oxazepam (BZO): NOT DETECTED
POC Oxycodone UR: NOT DETECTED
POC Secobarbital (BAR): NOT DETECTED

## 2022-11-27 MED ORDER — MAGNESIUM HYDROXIDE 400 MG/5ML PO SUSP: 30.0000 mL | Freq: Every day | ORAL | Status: DC | PRN

## 2022-11-27 MED ORDER — NICOTINE 14 MG/24HR TD PT24
14.0000 mg | MEDICATED_PATCH | Freq: Every day | TRANSDERMAL | Status: DC
  Administered 2022-11-28: 14 mg via TRANSDERMAL
  Filled 2022-11-27: qty 1

## 2022-11-27 MED ORDER — ACETAMINOPHEN 325 MG PO TABS
650.0000 mg | ORAL_TABLET | Freq: Four times a day (QID) | ORAL | Status: DC | PRN
  Administered 2022-11-28 (×2): 650 mg via ORAL
  Filled 2022-11-27 (×2): qty 2

## 2022-11-27 MED ORDER — TRAZODONE HCL 50 MG PO TABS
50.0000 mg | ORAL_TABLET | Freq: Every evening | ORAL | Status: DC | PRN
  Administered 2022-11-27: 50 mg via ORAL
  Filled 2022-11-27: qty 1

## 2022-11-27 MED ORDER — HYDROXYZINE HCL 25 MG PO TABS
25.0000 mg | ORAL_TABLET | Freq: Three times a day (TID) | ORAL | Status: DC | PRN
  Administered 2022-11-27 – 2022-11-28 (×2): 25 mg via ORAL
  Filled 2022-11-27 (×2): qty 1

## 2022-11-27 MED ORDER — ALUM & MAG HYDROXIDE-SIMETH 200-200-20 MG/5ML PO SUSP: 30.0000 mL | ORAL | Status: DC | PRN

## 2022-11-27 NOTE — ED Notes (Signed)
Pt A&O x 4, ambulates with assist of walker,  Presents with complaint  of depression and anxiety after passing  of Husband 6 mos ago.  Denies SI, HI or AVH.  Monitoring for safety.  Pt is High Fall risk.Karen Casey

## 2022-11-27 NOTE — BH Assessment (Signed)
Comprehensive Clinical Assessment (CCA) Note    11/27/2022 Karen Casey [id number]  Disposition: Cecilio Asper recommends inpatient hospitalization.   The patient demonstrates the following risk factors for suicide: Chronic risk factors for suicide include: psychiatric disorder of Major Depressive Disorder . Acute risk factors for suicide include: loss (financial, interpersonal, professional). Protective factors for this patient include: positive social support. Considering these factors, the overall suicide risk at this point appears to be low. Patient is not appropriate for outpatient follow up.    Pt is 67 yo female who presents voluntarily to [facility] with the complaint of depression. Pt reports that she is currently struggling with depression and grief over the past 6 months. Pt reports that her husband passed 6 months ago. Pt reports difficulty sleeping and only getting approximately 4 hours of sleep per night. Pt denies SI, HI, AVH, and paranoia. Pt reports that she was staying with her daughter but that living arrangement was not the best for her because her daughter is always asking for money. Pt reports that she currently does not have anywhere to live.  Pt reports that she does not have a therapist or psychiatrist at this time. Pt is open to outpatient services. Pt denies substance and alcohol use.  Pt was casually dressed and groomed appropriately. Pt is alert, oriented x4 with normal speech and normal motor behavior. Eye contact is good. Pt's mood is depressed, and affect is anxious. Thought process is coherent and relevant. Pt's insight is fair and judgement is poor. There is no indication pt is currently responding to internal stimuli or experiencing delusional thought content. Pt was cooperative throughout assessment .    Chief Complaint:  Chief Complaint  Patient presents with   Depression   Visit Diagnosis:  Major Depressive Disorder     CCA Screening, Triage and Referral  (STR)  Patient Reported Information How did you hear about [place]? Self  What Is the Reason for Your Visit/Call Today? Pt is 67 yo female who presents voluntarily to [facility] with the complaint of depression. Pt reports that she is currently struggling with depression and grief over the past 6 months. Pt reports that her husband passed 6 months ago. Pt reports difficulty sleeping and only getting approximately 4 hours of sleep per night. Pt denies SI, HI, AVH, and paranoia. Pt reports that she was staying with her daughter but that living arrangement was not the best for her because her daughter is always asking for money. Pt reports that she currently does not have anywhere to live.  Pt reports that she does not have a therapist or psychiatrist at this time. Pt is open to outpatient services. Pt denies substance and alcohol use.  How Long Has This Been Causing You Problems? > than 6 months  What Do You Feel Would Help You the Most Today? Treatment for Depression or other mood problem; Medication(s)   Have You Recently Had Any Thoughts About Hurting Yourself? No  Are You Planning to Commit Suicide/Harm Yourself At This time? No   Flowsheet Row ED to Hosp-Admission (Discharged) from 07/26/2022 in [place] [facility] ED from 06/24/2022 in [facility] Emergency Department at [facility] ED from 02/08/2022 in [facility] Emergency Department at [facility]  C-SSRS RISK CATEGORY Low Risk No Risk No Risk       Have you Recently Had Thoughts About Hurting Someone Karolee Ohs? No  Are You Planning to Harm Someone at This Time? No  Explanation:  Pt denies SI.   Have You Used Any Alcohol or Drugs in the Past 24 Hours? No  What Did You Use and How Much? Pt denies ETOH and Substance Use . Pt reports that she has been sober for the past 2 years ( alcohol) .   Do You Currently Have a Therapist/Psychiatrist? No  Name of Therapist/Psychiatrist: Name of  Therapist/Psychiatrist: n/a   Have You Been Recently Discharged From Any Office Practice or Programs? No  Explanation of Discharge From Practice/Program: n/a     CCA Screening Triage Referral Assessment Type of Contact: Face-to-Face  Telemedicine Service Delivery:   Is this Initial or Reassessment?   Date Telepsych consult ordered in [facility]:    Time Telepsych consult ordered in [facility]:    Location of Assessment: [facility] Assessment Services  Provider Location: [facility] Assessment Services   Collateral Involvement: None   Does Patient Have a Automotive engineer Guardian? No  Legal Guardian Contact Information: n/a  Copy of Legal Guardianship Form: -- (n/a)  Legal Guardian Notified of Arrival: -- (n/a)  Legal Guardian Notified of Pending Discharge: -- (n/a)  If Minor and Not Living with Parent(s), Who has Custody? n/a  Is CPS involved or ever been involved? Never  Is APS involved or ever been involved? Never   Patient Determined To Be At Risk for Harm To Self or Others Based on Review of Patient Reported Information or Presenting Complaint? No  Method: No Plan  Availability of Means: No access or NA  Intent: Vague intent or NA  Notification Required: No need or identified person  Additional Information for Danger to Others Potential: -- (n/a)  Additional Comments for Danger to Others Potential: n/a  Are There Guns or Other Weapons in Your Home? No  Types of Guns/Weapons: Pt denies access to guns/ weapons  Are These Weapons Safely Secured?                            No  Who Could Verify You Are Able To Have These Secured: n/a  Do You Have any Outstanding Charges, Pending Court Dates, Parole/Probation? Pt denies any pending legal charges at this time.  Contacted To Inform of Risk of Harm To Self or Others: -- (n/a)    Does Patient Present under Involuntary Commitment? No    [place] of Residence: [place]   Patient Currently Receiving the Following  Services: Not Receiving Services   Determination of Need: Routine (7 days)   Options For Referral: Outpatient Therapy; Medication Management     CCA Biopsychosocial Patient Reported Schizophrenia/Schizoaffective Diagnosis in Past: No   Strengths: Pt is willing to seek treatment   Mental Health Symptoms Depression:   Hopelessness; Tearfulness; Sleep (too much or little); Change in energy/activity   Duration of Depressive symptoms:  Duration of Depressive Symptoms: Greater than two weeks   Mania:   None   Anxiety:    Restlessness; Sleep; Worrying   Psychosis:   None   Duration of Psychotic symptoms:    Trauma:   None   Obsessions:   None   Compulsions:   None   Inattention:   None   Hyperactivity/Impulsivity:   None   Oppositional/Defiant Behaviors:   None   Emotional Irregularity:   Chronic feelings of emptiness   Other Mood/Personality Symptoms:   n/a    Mental Status Exam Appearance and self-care  Stature:   Average   Weight:   Average weight  Clothing:   Casual   Grooming:   Normal   Cosmetic use:   None   Posture/gait:   Normal   Motor activity:   Not Remarkable   Sensorium  Attention:   Normal   Concentration:   Normal   Orientation:   Time; Situation; Person; Place   Recall/memory:   Normal   Affect and Mood  Affect:   Anxious; Depressed   Mood:   Depressed; Anxious   Relating  Eye contact:   Normal   Facial expression:   Anxious; Sad; Depressed   Attitude toward examiner:   Cooperative   Thought and Language  Speech flow:  Clear and Coherent   Thought content:   Appropriate to Mood and Circumstances   Preoccupation:   None   Hallucinations:   None   Organization:   Coherent   [employer] of Knowledge:   Fair   Intelligence:   Average   Abstraction:   Normal   Judgement:   Fair   Dance movement psychotherapist:   Adequate   Insight:   Fair   Decision Making:    Normal   Social Functioning  Social Maturity:   Responsible   Social Judgement:   Normal   Stress  Stressors:   Housing; Relationship; Financial; Transitions; Family conflict   Coping Ability:   Overwhelmed; Exhausted   Skill Deficits:   Self-care; Responsibility   Supports:   Support needed     Religion: Religion/Spirituality Are You A Religious Person?: No How Might This Affect Treatment?: n/a  Leisure/Recreation: Leisure / Recreation Do You Have Hobbies?: No  Exercise/Diet: Exercise/Diet Do You Exercise?: No Have You Gained or Lost A Significant Amount of Weight in the Past Six Months?: No Do You Follow a Special Diet?: No Do You Have Any Trouble Sleeping?: No   CCA Employment/Education Employment/Work Situation: Employment / Work Situation Employment Situation: Unemployed Patient's Job has Been Impacted by Current Illness: No Has Patient ever Been in Equities trader?: No  Education: Education Is Patient Currently Attending School?: No Last Grade Completed: 12 Did You Product manager?: No Did You Have An Individualized Education Program (IIEP): No Did You Have Any Difficulty At School?: No Patient's Education Has Been Impacted by Current Illness: No   CCA Family/Childhood History Family and Relationship History: Family history Marital status: Widowed Widowed, when?: Dec 2023 Does patient have children?: Yes (26yo daughter) How many children?: 1 How is patient's relationship with their children?: strained relationship with daughter  Childhood History:  Childhood History By whom was/is the patient raised?: Both parents Did patient suffer any verbal/emotional/physical/sexual abuse as a child?: No Did patient suffer from severe childhood neglect?: No Has patient ever been sexually abused/assaulted/raped as an adolescent or adult?: No Was the patient ever a victim of a crime or a disaster?: No Witnessed domestic violence?: No Has patient been  affected by domestic violence as an adult?: No       CCA Substance Use Alcohol/Drug Use: Alcohol / Drug Use Pain Medications: See MAR  Prescriptions: none listed on patient information Over the Counter: See MAR  History of alcohol / drug use?: Yes Longest period of sobriety (when/how long): 2 years- pt is currently not using drugs or alcohol at this time Withdrawal Symptoms:  (n/a)                         ASAM's:  Six Dimensions of Multidimensional Assessment  Dimension 1:  Acute Intoxication and/or  Withdrawal Potential:      Dimension 2:  Biomedical Conditions and Complications:      Dimension 3:  Emotional, Behavioral, or Cognitive Conditions and Complications:     Dimension 4:  Readiness to Change:     Dimension 5:  Relapse, Continued use, or Continued Problem Potential:     Dimension 6:  Recovery/Living Environment:     ASAM Severity Score:    ASAM Recommended Level of Treatment:     Substance use Disorder (SUD) Substance Use Disorder (SUD)  Checklist Symptoms of Substance Use:  (n/a)  Recommendations for Services/Supports/Treatments: Recommendations for Services/Supports/Treatments Recommendations For Services/Supports/Treatments:  (n/a)  Discharge Disposition:    DSM5 Diagnoses: Patient Active Problem List   Diagnosis Date Noted   Anemia 10/29/2022   Grief reaction 07/29/2022   Hyponatremia 07/27/2022   Severe episode of recurrent major depressive disorder, 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   Nausea vomiting and diarrhea 02/09/2022   Colitis    Acute respiratory failure with hypoxia (HCC)    Right rib fracture 11/20/2021   Elevated LFTs 11/20/2021   Osteoporosis 09/23/2021   Vitamin D deficiency 07/03/2021   Peripheral neuropathy    Seborrheic keratoses 05/29/2021   Hepatic steatosis 01/05/2021   Hypokalemia 01/02/2021   COPD (chronic obstructive pulmonary disease) (HCC) 12/09/2020   Chronic pain of breast 11/12/2020    Compression fracture of body of thoracic vertebra (HCC) 11/06/2020   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   Hypomagnesemia    Paroxysmal atrial fibrillation (HCC)    Alcohol withdrawal (HCC) 09/10/2020   Pulmonary nodules/lesions, multiple 07/14/2020   HTN (hypertension) 06/02/2018   Left foot pain 08/23/2017   Anxiety 06/12/2012   Alcohol use 05/28/2012   Tobacco abuse 02/17/2012   GERD (gastroesophageal reflux disease) 02/04/2012   Depression 02/04/2012     Referrals to Alternative Service(s): Referred to Alternative Service(s):   Place:   Date:   Time:    Referred to Alternative Service(s):   Place:   Date:   Time:    Referred to Alternative Service(s):   Place:   Date:   Time:    Referred to Alternative Service(s):   Place:   Date:   Time:     Dava Najjar, [place], [facility], NCC

## 2022-11-27 NOTE — ED Notes (Signed)
Pt sleeping@this time. Breathing even and unlabored. Will continue to monitor for safety 

## 2022-11-28 ENCOUNTER — Other Ambulatory Visit: Payer: Self-pay

## 2022-11-28 ENCOUNTER — Inpatient Hospital Stay
Admission: RE | Admit: 2022-11-28 | Discharge: 2022-12-06 | DRG: 885 | Disposition: A | Discharge status: Home / Self Care | Payer: Medicare HMO | Source: Ambulatory Visit | Attending: Psychiatry | Admitting: Psychiatry

## 2022-11-28 ENCOUNTER — Encounter: Payer: Self-pay | Admitting: Psychiatry

## 2022-11-28 ENCOUNTER — Encounter (HOSPITAL_COMMUNITY): Payer: Self-pay | Admitting: Emergency Medicine

## 2022-11-28 DIAGNOSIS — R569 Unspecified convulsions: Secondary | ICD-10-CM | POA: Diagnosis present

## 2022-11-28 DIAGNOSIS — J441 Chronic obstructive pulmonary disease with (acute) exacerbation: Secondary | ICD-10-CM | POA: Diagnosis present

## 2022-11-28 DIAGNOSIS — J449 Chronic obstructive pulmonary disease, unspecified: Secondary | ICD-10-CM | POA: Diagnosis present

## 2022-11-28 DIAGNOSIS — Z5982 Transportation insecurity: Secondary | ICD-10-CM

## 2022-11-28 DIAGNOSIS — F332 Major depressive disorder, recurrent severe without psychotic features: Secondary | ICD-10-CM | POA: Diagnosis not present

## 2022-11-28 DIAGNOSIS — Z83438 Family history of other disorder of lipoprotein metabolism and other lipidemia: Secondary | ICD-10-CM

## 2022-11-28 DIAGNOSIS — G629 Polyneuropathy, unspecified: Secondary | ICD-10-CM | POA: Diagnosis present

## 2022-11-28 DIAGNOSIS — Z5986 Financial insecurity: Secondary | ICD-10-CM

## 2022-11-28 DIAGNOSIS — D508 Other iron deficiency anemias: Secondary | ICD-10-CM

## 2022-11-28 DIAGNOSIS — K219 Gastro-esophageal reflux disease without esophagitis: Secondary | ICD-10-CM | POA: Diagnosis present

## 2022-11-28 DIAGNOSIS — Z818 Family history of other mental and behavioral disorders: Secondary | ICD-10-CM | POA: Diagnosis not present

## 2022-11-28 DIAGNOSIS — I4891 Unspecified atrial fibrillation: Secondary | ICD-10-CM

## 2022-11-28 DIAGNOSIS — F419 Anxiety disorder, unspecified: Secondary | ICD-10-CM | POA: Diagnosis present

## 2022-11-28 DIAGNOSIS — R45851 Suicidal ideations: Secondary | ICD-10-CM | POA: Diagnosis present

## 2022-11-28 DIAGNOSIS — I10 Essential (primary) hypertension: Secondary | ICD-10-CM | POA: Diagnosis present

## 2022-11-28 DIAGNOSIS — Z79899 Other long term (current) drug therapy: Secondary | ICD-10-CM

## 2022-11-28 DIAGNOSIS — Z888 Allergy status to other drugs, medicaments and biological substances status: Secondary | ICD-10-CM

## 2022-11-28 DIAGNOSIS — F17210 Nicotine dependence, cigarettes, uncomplicated: Secondary | ICD-10-CM | POA: Diagnosis present

## 2022-11-28 DIAGNOSIS — F1010 Alcohol abuse, uncomplicated: Secondary | ICD-10-CM | POA: Diagnosis present

## 2022-11-28 DIAGNOSIS — D649 Anemia, unspecified: Secondary | ICD-10-CM | POA: Diagnosis present

## 2022-11-28 DIAGNOSIS — Z634 Disappearance and death of family member: Secondary | ICD-10-CM | POA: Diagnosis not present

## 2022-11-28 DIAGNOSIS — Z7901 Long term (current) use of anticoagulants: Secondary | ICD-10-CM

## 2022-11-28 DIAGNOSIS — Z9109 Other allergy status, other than to drugs and biological substances: Secondary | ICD-10-CM

## 2022-11-28 DIAGNOSIS — Z8249 Family history of ischemic heart disease and other diseases of the circulatory system: Secondary | ICD-10-CM | POA: Diagnosis not present

## 2022-11-28 LAB — CBC WITH DIFFERENTIAL/PLATELET
Abs Immature Granulocytes: 0 10*3/uL (ref 0.00–0.07)
Basophils Absolute: 0.1 10*3/uL (ref 0.0–0.1)
Basophils Relative: 1 %
Eosinophils Absolute: 0.1 10*3/uL (ref 0.0–0.5)
Eosinophils Relative: 1 %
HCT: 32 % — ABNORMAL LOW (ref 36.0–46.0)
Hemoglobin: 9.6 g/dL — ABNORMAL LOW (ref 12.0–15.0)
Lymphocytes Relative: 21 %
Lymphs Abs: 1.5 10*3/uL (ref 0.7–4.0)
MCH: 22.2 pg — ABNORMAL LOW (ref 26.0–34.0)
MCHC: 30 g/dL (ref 30.0–36.0)
MCV: 74.1 fL — ABNORMAL LOW (ref 80.0–100.0)
Monocytes Absolute: 0.3 10*3/uL (ref 0.1–1.0)
Monocytes Relative: 4 %
Neutro Abs: 5.2 10*3/uL (ref 1.7–7.7)
Neutrophils Relative %: 73 %
Platelets: 274 10*3/uL (ref 150–400)
RBC: 4.32 MIL/uL (ref 3.87–5.11)
RDW: 21.3 % — ABNORMAL HIGH (ref 11.5–15.5)
WBC: 7.1 10*3/uL (ref 4.0–10.5)
nRBC: 0 % (ref 0.0–0.2)
nRBC: 0 /100 WBC

## 2022-11-28 LAB — COMPREHENSIVE METABOLIC PANEL
ALT: 15 U/L (ref 0–44)
AST: 19 U/L (ref 15–41)
Albumin: 4.1 g/dL (ref 3.5–5.0)
Alkaline Phosphatase: 101 U/L (ref 38–126)
Anion gap: 13 (ref 5–15)
BUN: 11 mg/dL (ref 8–23)
CO2: 22 mmol/L (ref 22–32)
Calcium: 9.4 mg/dL (ref 8.9–10.3)
Chloride: 99 mmol/L (ref 98–111)
Creatinine, Ser: 1.38 mg/dL — ABNORMAL HIGH (ref 0.44–1.00)
GFR, Estimated: 43 mL/min — ABNORMAL LOW (ref >60)
Glucose, Bld: 119 mg/dL — ABNORMAL HIGH (ref 70–99)
Potassium: 4.8 mmol/L (ref 3.5–5.1)
Sodium: 134 mmol/L — ABNORMAL LOW (ref 135–145)
Total Bilirubin: 0.5 mg/dL (ref 0.3–1.2)
Total Protein: 6.6 g/dL (ref 6.5–8.1)

## 2022-11-28 LAB — LIPID PANEL
Cholesterol: 132 mg/dL (ref 0–200)
HDL: 79 mg/dL (ref >40)
LDL Cholesterol: 38 mg/dL (ref 0–99)
Total CHOL/HDL Ratio: 1.7 RATIO
Triglycerides: 76 mg/dL (ref <150)
VLDL: 15 mg/dL (ref 0–40)

## 2022-11-28 LAB — HEMOGLOBIN A1C
Hgb A1c MFr Bld: 4.9 % (ref 4.8–5.6)
Mean Plasma Glucose: 93.93 mg/dL

## 2022-11-28 LAB — TSH: TSH: 6.714 u[IU]/mL — ABNORMAL HIGH (ref 0.350–4.500)

## 2022-11-28 LAB — ETHANOL: Alcohol, Ethyl (B): <10 mg/dL (ref <10)

## 2022-11-28 MED ORDER — FERROUS SULFATE 325 (65 FE) MG PO TABS
325.0000 mg | ORAL_TABLET | ORAL | Status: DC
  Administered 2022-11-28: 325 mg via ORAL
  Filled 2022-11-28: qty 1

## 2022-11-28 MED ORDER — ACETAMINOPHEN 325 MG PO TABS
650.0000 mg | ORAL_TABLET | Freq: Four times a day (QID) | ORAL | Status: DC | PRN
  Administered 2022-11-28 – 2022-12-06 (×17): 650 mg via ORAL
  Filled 2022-11-28 (×18): qty 2

## 2022-11-28 MED ORDER — FLUTICASONE FUROATE-VILANTEROL 100-25 MCG/ACT IN AEPB
1.0000 | INHALATION_SPRAY | Freq: Every day | RESPIRATORY_TRACT | Status: DC
  Administered 2022-11-28: 1 via RESPIRATORY_TRACT
  Filled 2022-11-28: qty 28

## 2022-11-28 MED ORDER — TRAZODONE HCL 50 MG PO TABS
50.0000 mg | ORAL_TABLET | Freq: Once | ORAL | Status: AC | PRN
  Administered 2022-11-28: 50 mg via ORAL
  Filled 2022-11-28: qty 1

## 2022-11-28 MED ORDER — VITAMIN D (ERGOCALCIFEROL) 1.25 MG (50000 UNIT) PO CAPS
50000.0000 [IU] | ORAL_CAPSULE | ORAL | Status: DC
  Administered 2022-12-05: 50000 [IU] via ORAL
  Filled 2022-11-28: qty 1

## 2022-11-28 MED ORDER — LORAZEPAM 1 MG PO TABS: 1.0000 mg | ORAL_TABLET | ORAL | Status: DC | PRN

## 2022-11-28 MED ORDER — SERTRALINE HCL 25 MG PO TABS
25.0000 mg | ORAL_TABLET | Freq: Every day | ORAL | Status: DC
  Administered 2022-11-29: 25 mg via ORAL
  Filled 2022-11-28: qty 1

## 2022-11-28 MED ORDER — AMIODARONE HCL 200 MG PO TABS
200.0000 mg | ORAL_TABLET | Freq: Every day | ORAL | Status: DC
  Administered 2022-11-29 – 2022-12-06 (×8): 200 mg via ORAL
  Filled 2022-11-28 (×8): qty 1

## 2022-11-28 MED ORDER — GABAPENTIN 800 MG PO TABS: 800.0000 mg | ORAL_TABLET | Freq: Three times a day (TID) | ORAL | Status: DC

## 2022-11-28 MED ORDER — MAGNESIUM HYDROXIDE 400 MG/5ML PO SUSP: 30.0000 mL | Freq: Every day | ORAL | Status: DC | PRN

## 2022-11-28 MED ORDER — UMECLIDINIUM BROMIDE 62.5 MCG/ACT IN AEPB
1.0000 | INHALATION_SPRAY | Freq: Every day | RESPIRATORY_TRACT | Status: DC
  Administered 2022-11-29 – 2022-12-06 (×8): 1 via RESPIRATORY_TRACT
  Filled 2022-11-28 (×3): qty 7

## 2022-11-28 MED ORDER — ZIPRASIDONE MESYLATE 20 MG IM SOLR: 20.0000 mg | INTRAMUSCULAR | Status: DC | PRN

## 2022-11-28 MED ORDER — GABAPENTIN 400 MG PO CAPS
800.0000 mg | ORAL_CAPSULE | Freq: Three times a day (TID) | ORAL | Status: DC
  Administered 2022-11-28 – 2022-12-06 (×24): 800 mg via ORAL
  Filled 2022-11-28 (×24): qty 2

## 2022-11-28 MED ORDER — PANTOPRAZOLE SODIUM 40 MG PO TBEC
40.0000 mg | DELAYED_RELEASE_TABLET | Freq: Every day | ORAL | Status: DC
  Administered 2022-11-29 – 2022-12-06 (×8): 40 mg via ORAL
  Filled 2022-11-28 (×8): qty 1

## 2022-11-28 MED ORDER — APIXABAN 5 MG PO TABS
5.0000 mg | ORAL_TABLET | Freq: Two times a day (BID) | ORAL | Status: DC
  Administered 2022-11-28 – 2022-12-06 (×16): 5 mg via ORAL
  Filled 2022-11-28 (×16): qty 1

## 2022-11-28 MED ORDER — UMECLIDINIUM BROMIDE 62.5 MCG/ACT IN AEPB
1.0000 | INHALATION_SPRAY | Freq: Every day | RESPIRATORY_TRACT | Status: DC
  Administered 2022-11-28: 1 via RESPIRATORY_TRACT
  Filled 2022-11-28: qty 7

## 2022-11-28 MED ORDER — ALBUTEROL SULFATE (2.5 MG/3ML) 0.083% IN NEBU: 3.0000 mL | INHALATION_SOLUTION | Freq: Four times a day (QID) | RESPIRATORY_TRACT | Status: DC | PRN

## 2022-11-28 MED ORDER — FERROUS SULFATE 325 (65 FE) MG PO TABS
325.0000 mg | ORAL_TABLET | ORAL | Status: DC
  Administered 2022-11-30 – 2022-12-06 (×4): 325 mg via ORAL
  Filled 2022-11-28 (×4): qty 1

## 2022-11-28 MED ORDER — TRAZODONE HCL 50 MG PO TABS
50.0000 mg | ORAL_TABLET | Freq: Every evening | ORAL | Status: DC | PRN
  Administered 2022-11-28 – 2022-11-29 (×2): 50 mg via ORAL
  Filled 2022-11-28 (×2): qty 1

## 2022-11-28 MED ORDER — AMIODARONE HCL 200 MG PO TABS
200.0000 mg | ORAL_TABLET | Freq: Every day | ORAL | Status: DC
  Administered 2022-11-28: 200 mg via ORAL
  Filled 2022-11-28 (×2): qty 1

## 2022-11-28 MED ORDER — NICOTINE 14 MG/24HR TD PT24
14.0000 mg | MEDICATED_PATCH | Freq: Every day | TRANSDERMAL | Status: DC
  Administered 2022-11-29 – 2022-12-06 (×8): 14 mg via TRANSDERMAL
  Filled 2022-11-28 (×8): qty 1

## 2022-11-28 MED ORDER — GABAPENTIN 400 MG PO CAPS
800.0000 mg | ORAL_CAPSULE | Freq: Three times a day (TID) | ORAL | Status: DC
  Administered 2022-11-28 (×2): 800 mg via ORAL
  Filled 2022-11-28 (×2): qty 2

## 2022-11-28 MED ORDER — HYDROXYZINE HCL 25 MG PO TABS
25.0000 mg | ORAL_TABLET | Freq: Three times a day (TID) | ORAL | Status: DC | PRN
  Administered 2022-11-29 – 2022-12-05 (×14): 25 mg via ORAL
  Filled 2022-11-28 (×16): qty 1

## 2022-11-28 MED ORDER — ALUM & MAG HYDROXIDE-SIMETH 200-200-20 MG/5ML PO SUSP: 30.0000 mL | ORAL | Status: DC | PRN

## 2022-11-28 MED ORDER — OLANZAPINE 5 MG PO TBDP: 5.0000 mg | ORAL_TABLET | Freq: Three times a day (TID) | ORAL | Status: DC | PRN

## 2022-11-28 MED ORDER — FLUTICASONE FUROATE-VILANTEROL 100-25 MCG/ACT IN AEPB
1.0000 | INHALATION_SPRAY | Freq: Every day | RESPIRATORY_TRACT | Status: DC
  Administered 2022-11-29 – 2022-12-06 (×8): 1 via RESPIRATORY_TRACT
  Filled 2022-11-28 (×2): qty 28

## 2022-11-28 MED ORDER — SERTRALINE HCL 25 MG PO TABS
25.0000 mg | ORAL_TABLET | Freq: Every day | ORAL | Status: DC
  Administered 2022-11-28: 25 mg via ORAL
  Filled 2022-11-28: qty 1

## 2022-11-28 MED ORDER — APIXABAN 5 MG PO TABS
5.0000 mg | ORAL_TABLET | Freq: Two times a day (BID) | ORAL | Status: DC
  Administered 2022-11-28 (×2): 5 mg via ORAL
  Filled 2022-11-28 (×2): qty 1

## 2022-11-28 MED ORDER — PANTOPRAZOLE SODIUM 40 MG PO TBEC
40.0000 mg | DELAYED_RELEASE_TABLET | Freq: Every day | ORAL | Status: DC
  Administered 2022-11-28: 40 mg via ORAL
  Filled 2022-11-28: qty 1

## 2022-11-28 MED ORDER — VITAMIN D (ERGOCALCIFEROL) 1.25 MG (50000 UNIT) PO CAPS
50000.0000 [IU] | ORAL_CAPSULE | ORAL | Status: DC
  Administered 2022-11-28: 50000 [IU] via ORAL
  Filled 2022-11-28: qty 1

## 2022-11-28 NOTE — ED Notes (Signed)
Patient alert and oriented x4, denies SI/HI/AVH. Pt not very talkative this morning but cooperative with assessment. Per night shift, patient was saying racial slurs to some of the night shift staff. Verbal support and encouragement given but pt encouraged to remain appropriate.

## 2022-11-28 NOTE — Progress Notes (Signed)
Admission Note:  67 yr female who presentsvoluntary in no acute distress for the treatment of SI and Depression.  Pt was calm and cooperative with admission process. Pt presents with admission.  Marland KitchenPOC and unit policies explained and understanding verbalized. Consents obtained. Food and fluids offered, and fluids accepted. Pt had no additional questions or concerns pt currently denies any s/I h/I or AVH

## 2022-11-28 NOTE — Plan of Care (Signed)
D- Patient alert and oriented. Affect/mood. Denies SI, HI, AVH, and pain.   A- Scheduled medications administered to patient, per MD orders. Support and encouragement provided.  Routine safety checks conducted every 15 minutes.  Patient informed to notify staff with problems or concerns.  R- No adverse drug reactions noted. Patient contracts for safety at this time. Patient compliant with medications and treatment plan. Patient receptive, calm, and cooperative. Patient interacts well with others on the unit.  Patient remains safe at this time.  

## 2022-11-28 NOTE — ED Notes (Signed)
Pt sleeping@this time. Breathing even and unlabored. Will continue to monitor for safety 

## 2022-11-28 NOTE — ED Provider Notes (Signed)
FBC/OBS ASAP Discharge Summary  Date and Time: 11/28/2022 8:21 AM  Name: Karen Casey  MRN:  [id number]   Discharge Diagnoses:  Final diagnoses:  Severe episode of recurrent major depressive disorder, without psychotic features [facility])    Subjective: Patient states "I lost my husband in December 2023, he was my life, he was my world."  Patient's husband of 37 years passed away 6 months ago  Patient is reassessed by this nurse practitioner face-to-face.  Chart reviewed and patient discussed with Dr. Enedina Finner on 11/28/2022.  She is seated in observation area, no apparent distress.  She is alert and oriented, pleasant and cooperative during assessment.  She presents with depressed mood, congruent affect.  She endorses depressive symptoms worsening x 6 months.  Symptoms include anhedonia, increased tearful episodes, decreased sleep and appetite.  Patient also endorses history of passive suicidal ideations.  She states "I have thought about it but I know that I would never follow through with it, I do not have the courage."  Most recent passive suicidal ideation 2 days ago.  Karen Casey reports she feels she is a "burden to everyone."  Recent stressors include an argument with her brother after she asked her sister to purchase cigarettes for her.  Patient currently has very little money as she attempts to help her adult daughter financially.  Additional stressors include losing her apartment after her "lease ran out" on May 31.  Patient plans to transition to a rooming house on next Wednesday.  Mental health history includes major depressive disorder anxiety disorder and alcohol addiction.  Patient is not linked with outpatient psychiatry currently.  She has been prescribed Seroquel in the past, unable to recall most recent dose.  She endorses 1 previous inpatient psychiatric hospitalization approximately 10 years ago related to depression.  No family mental health or addiction history reported.  Patient denies  suicidal and homicidal ideations currently.  She denies history of suicide attempts, denies history of nonsuicidal self-harm behavior.  She denies auditory and visual hallucinations.  There is no evidence of delusional thought content and no indication that patient is responding to internal stimuli.  She denies symptoms of paranoia.  Karen Casey resides in [place] part-time with daughter and part-time with her sister.  Patient endorses chronic diabetic neuropathy causing bilateral foot pain.  No physical pain currently.  She ambulates using walker for assistance.  Walker at bedside.  Patient reports she does not "get along" with family. My family tells me I am always "mean to everybody." She is retired. She denies recent alcohol and substance use. Endorses history of alcohol use disorder. In recovery for two years.  Patient offered support and encouragement.  She verbalized understanding of treatment plan    Stay Summary: HPI 11/28/2022-2000pm: Karen Casey is a 67 year old female with psychiatric history significant for alcohol abuse (in remission), depression, and anxiety.  Patient presented voluntarily to [facility] reporting increased anxiety and worsening depressive symptoms.   Patient was evaluated face-to-face and her chart was reviewed by this nurse practitioner. On assessment, patient is observed sitting in the assessment room in no apparent distress.  Patient is alert and oriented x 4; she is noted to be very anxious.  Her speech is clear and coherent; thought process is linear. Her mood is anxious and depressed with a congruent affect.  Objectively she did not appear to be responding to any internal/external stimuli or experiencing any delusional thought content during assessment.   Patient reports that her husband passed away about 6  months ago. She says she is "struggling with loneliness, grief, and depression."  She says that she recently lost her apartment and has been residing with her  daughter; patient says her daughter's home is very stressful due to grandchildren being diagnosed with autism. She says she is feeling overwhelmed and having "mental meltdowns" 3-4 times per week. She reports feeling irritable and angry all the time." She reports that she depressed and endorses depressive symptoms of hopelessness, worthlessness, crying spells, isolation, anhedonia, irritability, anger, poor concentration, restlessness, anxiety, poor sleep, and poor appetite. She reports that she is a recovering alcoholic. She says that she has been sober for approximately 2 years but has been struggling with the urge to start drinking beer to cope with stress and depressive symptoms.  She reports passive suicidal thought of "wishing to be dead." She denies active suicidal ideation, plan, or intent.  She denies homicidal ideation, hallucination, paranoia, and substance use.     Total Time spent with patient: 30 minutes  Past Psychiatric History: MDD, recurrent, severe Past Medical History: Seizure, hypertension, GERD, dyspnea, COPD, urinary incontinence Family History: none reported Family Psychiatric History: none reported Social History: retired, widowed, resides with family Tobacco Cessation:  Prescription not provided because: patient will be admitted to inpatient psychiatric treatment  Current Medications:  Current Facility-Administered Medications  Medication Dose Route Frequency Provider Last Rate Last Admin   acetaminophen (TYLENOL) tablet 650 mg  650 mg Oral Q6H PRN Ajibola, Ene A, NP   650 mg at 11/28/22 0617   albuterol (PROVENTIL) (2.5 MG/3ML) 0.083% nebulizer solution 3 mL  3 mL Inhalation Q6H PRN Ajibola, Ene A, NP       alum & mag hydroxide-simeth (MAALOX/MYLANTA) 200-200-20 MG/5ML suspension 30 mL  30 mL Oral Q4H PRN Ajibola, Ene A, NP       amiodarone (PACERONE) tablet 200 mg  200 mg Oral Daily Ajibola, Ene A, NP       apixaban (ELIQUIS) tablet 5 mg  5 mg Oral BID Ajibola, Ene A,  NP   5 mg at 11/28/22 0137   ferrous sulfate tablet 325 mg  325 mg Oral QODAY Ajibola, Ene A, NP       fluticasone furoate-vilanterol (BREO ELLIPTA) 100-25 MCG/ACT 1 puff  1 puff Inhalation Daily Ajibola, Ene A, NP       And   umeclidinium bromide (INCRUSE ELLIPTA) 62.5 MCG/ACT 1 puff  1 puff Inhalation Daily Ajibola, Ene A, NP       gabapentin (NEURONTIN) capsule 800 mg  800 mg Oral TID Ajibola, Ene A, NP   800 mg at 11/28/22 0132   hydrOXYzine (ATARAX) tablet 25 mg  25 mg Oral TID PRN Ajibola, Ene A, NP   25 mg at 11/28/22 0137   magnesium hydroxide (MILK OF MAGNESIA) suspension 30 mL  30 mL Oral Daily PRN Ajibola, Ene A, NP       nicotine (NICODERM CQ - dosed in mg/24 hours) patch 14 mg  14 mg Transdermal Daily Ajibola, Ene A, NP       pantoprazole (PROTONIX) EC tablet 40 mg  40 mg Oral Daily Ajibola, Ene A, NP       sertraline (ZOLOFT) tablet 25 mg  25 mg Oral Daily Ajibola, Ene A, NP       traZODone (DESYREL) tablet 50 mg  50 mg Oral QHS PRN Ajibola, Ene A, NP   50 mg at 11/27/22 2209   Vitamin D (Ergocalciferol) (DRISDOL) 1.25 MG (50000 UNIT) capsule 50,000 Units  50,000 Units Oral Q7 days Ajibola, Ene A, NP       Current Outpatient Medications  Medication Sig Dispense Refill   albuterol (VENTOLIN HFA) 108 (90 Base) MCG/ACT inhaler Inhale 2 puffs into the lungs every 6 (six) hours as needed for wheezing or shortness of breath. 1 each 2   amiodarone (PACERONE) 200 MG tablet Take 1 tablet (200 mg total) by mouth daily. 90 tablet 3   apixaban (ELIQUIS) 5 MG TABS tablet Take 1 tablet (5 mg total) by mouth 2 (two) times daily. 56 tablet 12   ferrous sulfate 325 (65 FE) MG tablet Take 1 tablet (325 mg total) by mouth every other day. 60 tablet 1   Fluticasone-Umeclidin-Vilant (TRELEGY ELLIPTA) 100-62.5-25 MCG/ACT AEPB Inhale 1 puff into the lungs daily. 28 each 1   gabapentin (NEURONTIN) 800 MG tablet Take 1 tablet (800 mg total) by mouth 3 (three) times daily. 90 tablet 12   hydrOXYzine  (ATARAX) 50 MG tablet Take 1 tablet (50 mg total) by mouth 3 (three) times daily as needed. 90 tablet 3   pantoprazole (PROTONIX) 40 MG tablet Take 1 tablet (40 mg total) by mouth daily. 30 tablet 4   QUEtiapine (SEROQUEL) 200 MG tablet TAKE 1 TABLET BY MOUTH EVERYDAY AT BEDTIME (Patient not taking: Reported on 11/11/2022) 90 tablet 1   Vitamin D, Ergocalciferol, (DRISDOL) 1.25 MG (50000 UNIT) CAPS capsule Take 1 capsule (50,000 Units total) by mouth every 7 (seven) days. 8 capsule 0    PTA Medications:  Facility Ordered Medications  Medication   acetaminophen (TYLENOL) tablet 650 mg   alum & mag hydroxide-simeth (MAALOX/MYLANTA) 200-200-20 MG/5ML suspension 30 mL   magnesium hydroxide (MILK OF MAGNESIA) suspension 30 mL   hydrOXYzine (ATARAX) tablet 25 mg   traZODone (DESYREL) tablet 50 mg   nicotine (NICODERM CQ - dosed in mg/24 hours) patch 14 mg   amiodarone (PACERONE) tablet 200 mg   albuterol (PROVENTIL) (2.5 MG/3ML) 0.083% nebulizer solution 3 mL   apixaban (ELIQUIS) tablet 5 mg   ferrous sulfate tablet 325 mg   fluticasone furoate-vilanterol (BREO ELLIPTA) 100-25 MCG/ACT 1 puff   And   umeclidinium bromide (INCRUSE ELLIPTA) 62.5 MCG/ACT 1 puff   Vitamin D (Ergocalciferol) (DRISDOL) 1.25 MG (50000 UNIT) capsule 50,000 Units   pantoprazole (PROTONIX) EC tablet 40 mg   gabapentin (NEURONTIN) capsule 800 mg   [COMPLETED] traZODone (DESYREL) tablet 50 mg   sertraline (ZOLOFT) tablet 25 mg   PTA Medications  Medication Sig   amiodarone (PACERONE) 200 MG tablet Take 1 tablet (200 mg total) by mouth daily.   gabapentin (NEURONTIN) 800 MG tablet Take 1 tablet (800 mg total) by mouth 3 (three) times daily.   hydrOXYzine (ATARAX) 50 MG tablet Take 1 tablet (50 mg total) by mouth 3 (three) times daily as needed.   albuterol (VENTOLIN HFA) 108 (90 Base) MCG/ACT inhaler Inhale 2 puffs into the lungs every 6 (six) hours as needed for wheezing or shortness of breath.    Fluticasone-Umeclidin-Vilant (TRELEGY ELLIPTA) 100-62.5-25 MCG/ACT AEPB Inhale 1 puff into the lungs daily.   pantoprazole (PROTONIX) 40 MG tablet Take 1 tablet (40 mg total) by mouth daily.   apixaban (ELIQUIS) 5 MG TABS tablet Take 1 tablet (5 mg total) by mouth 2 (two) times daily.   Vitamin D, Ergocalciferol, (DRISDOL) 1.25 MG (50000 UNIT) CAPS capsule Take 1 capsule (50,000 Units total) by mouth every 7 (seven) days.   ferrous sulfate 325 (65 FE) MG tablet Take 1 tablet (325 mg  total) by mouth every other day.   QUEtiapine (SEROQUEL) 200 MG tablet TAKE 1 TABLET BY MOUTH EVERYDAY AT BEDTIME (Patient not taking: Reported on 11/11/2022)       10/29/2022   10:40 AM 10/23/2022   10:08 PM 06/11/2022    2:55 PM  Depression screen PHQ 2/9  Decreased Interest 1 3 3   Down, Depressed, Hopeless 2 3 3   PHQ - 2 Score 3 6 6   Altered sleeping 3 3 3   Tired, decreased energy 2 3 3   Change in appetite 1 2 2   Feeling bad or failure about yourself  1 2 2   Trouble concentrating 2 2 2   Moving slowly or fidgety/restless 0 0 2  Suicidal thoughts 0 1 1  PHQ-9 Score 12 19 21   Difficult doing work/chores Not difficult at all      Flowsheet Row ED from 11/27/2022 in [facility] ED to Hosp-Admission (Discharged) from 07/26/2022 in [facility] FLOOR [facility]OLOGY ED from 06/24/2022 in [facility] Emergency Department at [facility]  C-SSRS RISK CATEGORY No Risk Low Risk No Risk       Musculoskeletal  Strength & Muscle Tone: within normal limits Gait & Station: normal Patient leans: N/A  Psychiatric Specialty Exam  Presentation  General Appearance:  Appropriate for Environment; Casual  Eye Contact: Good  Speech: Clear and Coherent; Normal Rate  Speech Volume: Normal  Handedness: Right   Mood and Affect  Mood: Depressed  Affect: Depressed   Thought Process  Thought Processes: Coherent; Goal Directed; Linear  Descriptions of  Associations:Intact  Orientation:Full (Time, Place and Person)  Thought Content:Logical; WDL  Diagnosis of Schizophrenia or Schizoaffective disorder in past: No    Hallucinations:Hallucinations: None  Ideas of Reference:None  Suicidal Thoughts:Suicidal Thoughts: No SI Passive Intent and/or Plan: Without Intent; Without Plan  Homicidal Thoughts:Homicidal Thoughts: No   Sensorium  Memory: Immediate Good; Recent Fair  Judgment: Fair  Insight: Present   Executive Functions  Concentration: Good  Attention Span: Good  Recall: Good  Fund of Knowledge: Good  Language: Good   Psychomotor Activity  Psychomotor Activity: Psychomotor Activity: Normal   Assets  Assets: Communication Skills; Desire for Improvement; Financial Resources/Insurance; Resilience; Social Support   Sleep  Sleep: Sleep: Fair Number of Hours of Sleep: 3   Nutritional Assessment (For OBS and FBC admissions only) Has the patient had a weight loss or gain of 10 pounds or more in the last 3 months?: No Has the patient had a decrease in food intake/or appetite?: No Does the patient have dental problems?: No Does the patient have eating habits or behaviors that may be indicators of an eating disorder including binging or inducing vomiting?: No Has the patient recently lost weight without trying?: 0 Has the patient been eating poorly because of a decreased appetite?: 0 Malnutrition Screening Tool Score: 0    Physical Exam  Physical Exam Vitals and nursing note reviewed.  Constitutional:      Appearance: Normal appearance. She is well-developed.  HENT:     Head: Normocephalic and atraumatic.     Nose: Nose normal.  Cardiovascular:     Rate and Rhythm: Normal rate.  Pulmonary:     Effort: Pulmonary effort is normal.  Musculoskeletal:        General: Normal range of motion.     Cervical back: Normal range of motion.  Skin:    General: Skin is warm and dry.  Neurological:  Mental Status: She is alert and oriented to person, place, and time.  Psychiatric:        Attention and Perception: Attention and perception normal.        Mood and Affect: Affect normal. Mood is depressed.        Speech: Speech normal.        Behavior: Behavior normal. Behavior is cooperative.        Thought Content: Thought content normal.        Cognition and Memory: Cognition and memory normal.    Review of Systems  Constitutional: Negative.   HENT: Negative.    Eyes: Negative.   Respiratory: Negative.    Cardiovascular: Negative.   Gastrointestinal: Negative.   Genitourinary: Negative.   Musculoskeletal: Negative.   Skin: Negative.   Neurological: Negative.   Psychiatric/Behavioral:  Positive for depression.    Blood pressure 138/77, pulse 84, temperature 98 F (36.7 C), temperature source Oral, resp. rate 20, SpO2 96 %. There is no height or weight on file to calculate BMI.  Demographic Factors:  Divorced or widowed and Caucasian  Loss Factors: Loss of significant relationship  Historical Factors: NA  Risk Reduction Factors:   Sense of responsibility to family, Living with another person, especially a relative, Positive social support, Positive therapeutic relationship, and Positive coping skills or problem solving skills  Continued Clinical Symptoms:  Unstable or Poor Therapeutic Relationship  Cognitive Features That Contribute To Risk:  None    Suicide Risk:  Minimal: No identifiable suicidal ideation.  Patients presenting with no risk factors but with morbid ruminations; may be classified as minimal risk based on the severity of the depressive symptoms  Plan Of Care/Follow-up recommendations:  Inpatient geriatric psychiatric admission recommended.  Sertraline 25 mg daily initiated during this admission to address ongoing depressed mood.  Trazodone 50 mg nightly as needed/sleep and hydroxyzine 25 mg 3 times daily as needed/anxiety also  initiated.  Disposition: Patient accepted to inpatient psychiatric admission at [facility] by Dr Marlou Porch. She will be transported later today. She remains voluntary and agrees with plan.   Lenard Lance, FNP 11/28/2022, 8:21 AM

## 2022-11-28 NOTE — Group Note (Signed)
LCSW Group Therapy Note   Group Date: 11/28/2022 Start Time:  1:15 PM End Time:  1:55 PM   Type of Therapy and Topic:  Group Therapy: Geriatric Depression  Participation Level:  Did Not Attend    Summary of Patient Progress:  The patient did not attend group.    Marshell Levan, LCSWA 11/28/2022  2:13 PM

## 2022-11-28 NOTE — ED Provider Notes (Signed)
[facility] Continuous Assessment Admission H&P  Date: 11/28/22 Patient Name: Karen Casey MRN: [id number] Chief Complaint: "I'm struggling with loneliness, grief, and depression."    Diagnoses:  Final diagnoses:  Severe episode of recurrent major depressive disorder, without psychotic features [facility])    HPI: Karen Casey is a 67 year old female with psychiatric history significant for alcohol abuse (in remission), depression, and anxiety.  Patient presented voluntarily to [facility] reporting increased anxiety and worsening depressive symptoms.  Patient was evaluated face-to-face and her chart was reviewed by this nurse practitioner. On assessment, patient is observed sitting in the assessment room in no apparent distress.  Patient is alert and oriented x 4; she is noted to be very anxious.  Her speech is clear and coherent; thought process is linear. Her mood is anxious and depressed with a congruent affect.  Objectively she did not appear to be responding to any internal/external stimuli or experiencing any delusional thought content during assessment.  Patient reports that her husband passed away about 6 months ago. She says she is "struggling with loneliness, grief, and depression."  She says that she recently lost her apartment and has been residing with her daughter; patient says her daughter's home is very stressful due to grandchildren being diagnosed with autism. She says she is feeling overwhelmed and having "mental meltdowns" 3-4 times per week. She reports feeling irritable and angry all the time." She reports that she depressed and endorses depressive symptoms of hopelessness, worthlessness, crying spells, isolation, anhedonia, irritability, anger, poor concentration, restlessness, anxiety, poor sleep, and poor appetite. She reports that she is a recovering alcoholic. She says that she has been sober for approximately 2 years but has been struggling with the urge to start drinking  beer to cope with stress and depressive symptoms.  She reports passive suicidal thought of "wishing to be dead." She denies active suicidal ideation, plan, or intent.  She denies homicidal ideation, hallucination, paranoia, and substance use.    Total Time spent with patient: 30 minutes  Musculoskeletal  Strength & Muscle Tone: within normal limits Gait & Station: normal Patient leans: Right  Psychiatric Specialty Exam  Presentation General Appearance:  Appropriate for Environment  Eye Contact: Good  Speech: Clear and Coherent  Speech Volume: Normal  Handedness: Right   Mood and Affect  Mood: Depressed; Anxious  Affect: Tearful   Thought Process  Thought Processes: Coherent  Descriptions of Associations:Intact  Orientation:Full (Time, Place and Person)  Thought Content:WDL  Diagnosis of Schizophrenia or Schizoaffective disorder in past: No   Hallucinations:Hallucinations: None  Ideas of Reference:None  Suicidal Thoughts:Suicidal Thoughts: Yes, Passive SI Passive Intent and/or Plan: Without Intent; Without Plan  Homicidal Thoughts:Homicidal Thoughts: No   Sensorium  Memory: Recent Fair; Remote Fair; Immediate Good  Judgment: Fair  Insight: Fair   Chartered certified accountant: Fair  Attention Span: Fair  Recall: Fair  Fund of Knowledge: Good  Language: Good   Psychomotor Activity  Psychomotor Activity: Psychomotor Activity: Restlessness   Assets  Assets: Communication Skills; Desire for Improvement   Sleep  Sleep: Sleep: Fair Number of Hours of Sleep: 3   Nutritional Assessment (For OBS and FBC admissions only) Has the patient had a weight loss or gain of 10 pounds or more in the last 3 months?: No Has the patient had a decrease in food intake/or appetite?: No Does the patient have dental problems?: No Does the patient have eating habits or behaviors that may be indicators of an eating disorder including  binging or inducing vomiting?: No Has the patient recently lost weight without trying?: 0 Has the patient been eating poorly because of a decreased appetite?: 0 Malnutrition Screening Tool Score: 0    Physical Exam Vitals and nursing note reviewed.  Constitutional:      General: She is not in acute distress.    Appearance: She is well-developed.  HENT:     Head: Normocephalic and atraumatic.  Eyes:     Conjunctiva/sclera: Conjunctivae normal.  Cardiovascular:     Rate and Rhythm: Normal rate.     Heart sounds: No murmur heard. Pulmonary:     Effort: Pulmonary effort is normal. No respiratory distress.  Abdominal:     Palpations: Abdomen is soft.     Tenderness: There is no abdominal tenderness.  Musculoskeletal:        General: No swelling.     Cervical back: Neck supple.  Skin:    Capillary Refill: Capillary refill takes less than 2 seconds.     Findings: Bruising (scabs to forearms) present.  Neurological:     Mental Status: She is alert and oriented to person, place, and time.  Psychiatric:        Attention and Perception: Attention and perception normal.        Mood and Affect: Mood normal.        Speech: Speech normal.        Behavior: Behavior normal. Behavior is cooperative.        Thought Content: Thought content normal.        Cognition and Memory: Cognition normal.   Review of Systems  Constitutional: Negative.   HENT: Negative.    Eyes: Negative.   Respiratory: Negative.    Cardiovascular: Negative.   Gastrointestinal: Negative.   Genitourinary: Negative.   Musculoskeletal: Negative.   Skin: Negative.   Neurological: Negative.   Endo/Heme/Allergies: Negative.   Psychiatric/Behavioral:  Positive for depression. The patient is nervous/anxious.     Blood pressure 128/70, pulse 93, temperature 98.3 F (36.8 C), temperature source Oral, resp. rate 18, SpO2 96 %. There is no height or weight on file to calculate BMI.  Past Psychiatric History: Alcohol  abuse (in remission), depression, anxiety   Is the patient at risk to self? No  Has the patient been a risk to self in the past 6 months? No .    Has the patient been a risk to self within the distant past? No   Is the patient a risk to others? No   Has the patient been a risk to others in the past 6 months? No   Has the patient been a risk to others within the distant past? No   Past Medical History:  Past Medical History:  Diagnosis Date  . Alcohol abuse    last use 03/09/21, marijuana last 03/09/21  . Allergy   . Anxiety   . Cataract 06/09/2012   Right eye and left eye  . COPD (chronic obstructive pulmonary disease) (HCC)   . Depression   . 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/04/2020   Due to benzodiazepine withdrawal  . Dyspnea    uses oxygen 2L via [place] prn  . Enterococcal bacteremia 02/10/2022  . GERD (gastroesophageal reflux disease)   . Headache   . Hypertension   . Long term (current) use of anticoagulants   . Nausea vomiting and diarrhea 02/09/2022  . Neuromuscular disorder (HCC)   . Neuropathy   . Positive blood culture 02/10/2022  . 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 Seizure (HCC)    08/2020 per patient  . Seizures (HCC)    xanax withdrawl- December 2013  . Urinary incontinence 06/09/2012     Family History:  Family History  Problem Relation Age of Onset  . Hypertension Mother   . Hyperlipidemia Mother   . Aneurysm Mother        Rupture - Cause of death  . Heart disease Father        MI - cause of death  . Depression Father   . Parkinsonism Father   . Hypertension Sister   . Colon cancer Neg Hx   . Esophageal cancer Neg Hx   . Rectal cancer Neg Hx   . Stomach cancer Neg Hx      Social History:  Social History   Tobacco Use  . Smoking status: Every Day    Packs/day: 0.50    Years: 43.00    Additional pack years: 0.00    Total pack years: 21.50    Types: Cigarettes  . Smokeless tobacco: Never  .  Tobacco comments:    Tobacco info given  Vaping Use  . Vaping Use: Never used  Substance Use Topics  . Alcohol use: Yes    Alcohol/week: 3.0 standard drinks of alcohol    Types: 3 Cans of beer per week    Comment: 1-2 times week just beer  . Drug use: Yes    Types: Marijuana, Other-see comments    Comment: Past hx of benzo abuse--xanax     Last Labs:  Admission on 11/27/2022  Component Date Value Ref Range Status  . WBC 11/27/2022 7.1  4.0 - 10.5 K/uL Final  . RBC 11/27/2022 4.32  3.87 - 5.11 MIL/uL Final  . Hemoglobin 11/27/2022 9.6 (L)  12.0 - 15.0 g/dL Final  . HCT 40/98/1191 32.0 (L)  36.0 - 46.0 % Final  . MCV 11/27/2022 74.1 (L)  80.0 - 100.0 fL Final  . MCH 11/27/2022 22.2 (L)  26.0 - 34.0 pg Final  . MCHC 11/27/2022 30.0  30.0 - 36.0 g/dL Final  . RDW 47/82/9562 21.3 (H)  11.5 - 15.5 % Final  . Platelets 11/27/2022 274  150 - 400 K/uL Final  . nRBC 11/27/2022 0.0  0.0 - 0.2 % Final  . Neutrophils Relative % 11/27/2022 73  % Final  . Neutro Abs 11/27/2022 5.2  1.7 - 7.7 K/uL Final  . Lymphocytes Relative 11/27/2022 21  % Final  . Lymphs Abs 11/27/2022 1.5  0.7 - 4.0 K/uL Final  . Monocytes Relative 11/27/2022 4  % Final  . Monocytes Absolute 11/27/2022 0.3  0.1 - 1.0 K/uL Final  . Eosinophils Relative 11/27/2022 1  % Final  . Eosinophils Absolute 11/27/2022 0.1  0.0 - 0.5 K/uL Final  . Basophils Relative 11/27/2022 1  % Final  . Basophils Absolute 11/27/2022 0.1  0.0 - 0.1 K/uL Final  . nRBC 11/27/2022 0  0 /100 WBC Final  . Abs Immature Granulocytes 11/27/2022 0.00  0.00 - 0.07 K/uL Final  . Polychromasia 11/27/2022 PRESENT   Final   Performed at [facility], [address] [place], [place] [address]  . Sodium 11/27/2022 134 (L)  135 - 145 mmol/L Final  . Potassium 11/27/2022 4.8  3.5 - 5.1 mmol/L Final  . Chloride 11/27/2022 99  98 - 111 mmol/L Final  . CO2 11/27/2022 22  22 - 32 mmol/L Final  . Glucose, Bld 11/27/2022  119 (H)  70 - 99 mg/dL Final    Glucose reference range applies only to samples taken after fasting for at least 8 hours.  . BUN 11/27/2022 11  8 - 23 mg/dL Final  . Creatinine, Ser 11/27/2022 1.38 (H)  0.44 - 1.00 mg/dL Final  . Calcium 41/32/4401 9.4  8.9 - 10.3 mg/dL Final  . Total Protein 11/27/2022 6.6  6.5 - 8.1 g/dL Final  . Albumin 02/72/5366 4.1  3.5 - 5.0 g/dL Final  . AST 44/08/4740 19  15 - 41 U/L Final  . ALT 11/27/2022 15  0 - 44 U/L Final  . Alkaline Phosphatase 11/27/2022 101  38 - 126 U/L Final  . Total Bilirubin 11/27/2022 0.5  0.3 - 1.2 mg/dL Final  . GFR, Estimated 11/27/2022 43 (L)  >60 mL/min Final   Comment: (NOTE) Calculated using the CKD-EPI Creatinine Equation (2021)   . Anion gap 11/27/2022 13  5 - 15 Final   Performed at [facility], [address], [place], [place] [address]  . Hgb A1c MFr Bld 11/27/2022 4.9  4.8 - 5.6 % Final   Comment: (NOTE) Pre diabetes:          5.7%-6.4%  Diabetes:              >6.4%  Glycemic control for   <7.0% adults with diabetes   . Mean Plasma Glucose 11/27/2022 93.93  mg/dL Final   Performed at [facility], [address], [place], [place] [address]  . Alcohol, Ethyl (B) 11/27/2022 <10  <10 mg/dL Final   Comment: (NOTE) Lowest detectable limit for serum alcohol is 10 mg/dL.  For medical purposes only. Performed at [facility], [address], [place], [place] [address]   . Cholesterol 11/27/2022 132  0 - 200 mg/dL Final  . Triglycerides 11/27/2022 76  <150 mg/dL Final  . HDL 18/84/1660 79  >40 mg/dL Final  . Total CHOL/HDL Ratio 11/27/2022 1.7  RATIO Final  . VLDL 11/27/2022 15  0 - 40 mg/dL Final  . LDL Cholesterol 11/27/2022 38  0 - 99 mg/dL Final   Comment:        Total Cholesterol/HDL:CHD Risk Coronary Heart Disease Risk Table                     Men   Women  1/2 Average Risk   3.4   3.3  Average Risk       5.0   4.4  2 X Average Risk   9.6   7.1  3 X Average Risk  23.4   11.0        Use the calculated Patient  Ratio above and the CHD Risk Table to determine the patient's CHD Risk.        ATP III CLASSIFICATION (LDL):  <100     mg/dL   Optimal  630-160  mg/dL   Near or Above                    Optimal  130-159  mg/dL   Borderline  109-323  mg/dL   High  >557     mg/dL   Very High Performed at [facility], [address], [place], [place] [address]   . POC Amphetamine UR 11/27/2022 None Detected  NONE DETECTED (Cut Off Level 1000 ng/mL) Preliminary  . POC Secobarbital (BAR) 11/27/2022 None Detected  NONE DETECTED (Cut Off Level 300 ng/mL) Preliminary  .  POC Buprenorphine (BUP) 11/27/2022 None Detected  NONE DETECTED (Cut Off Level 10 ng/mL) Preliminary  . POC Oxazepam (BZO) 11/27/2022 None Detected  NONE DETECTED (Cut Off Level 300 ng/mL) Preliminary  . POC Cocaine UR 11/27/2022 None Detected  NONE DETECTED (Cut Off Level 300 ng/mL) Preliminary  . POC Methamphetamine UR 11/27/2022 None Detected  NONE DETECTED (Cut Off Level 1000 ng/mL) Preliminary  . POC Morphine 11/27/2022 None Detected  NONE DETECTED (Cut Off Level 300 ng/mL) Preliminary  . POC Methadone UR 11/27/2022 None Detected  NONE DETECTED (Cut Off Level 300 ng/mL) Preliminary  . POC Oxycodone UR 11/27/2022 None Detected  NONE DETECTED (Cut Off Level 100 ng/mL) Preliminary  . POC Marijuana UR 11/27/2022 None Detected  NONE DETECTED (Cut Off Level 50 ng/mL) Preliminary  . TSH 11/27/2022 6.714 (H)  0.350 - 4.500 uIU/mL Final   Comment: Performed by a 3rd Generation assay with a functional sensitivity of <=0.01 uIU/mL. Performed at [facility], [address], [place], [place] [address]   Office Visit on 10/29/2022  Component Date Value Ref Range Status  . Total Iron Binding Capacity 10/29/2022 436  250 - 450 ug/dL Final  . UIBC 57/84/6962 421 (H)  118 - 369 ug/dL Final  . Iron 95/28/4132 15 (L)  27 - 139 ug/dL Final  . Iron Saturation 10/29/2022 3 (LL)  15 - 55 % Final  . Ferritin 10/29/2022 7 (L)  15 - 150 ng/mL Final   . Vitamin B-12 10/29/2022 245  232 - 1,245 pg/mL Final  . Folate 10/29/2022 7.1  >3.0 ng/mL Final   Comment: A serum folate concentration of less than 3.1 ng/mL is considered to represent clinical deficiency.   . WBC 10/29/2022 5.6  3.4 - 10.8 x10E3/uL Final  . RBC 10/29/2022 3.93  3.77 - 5.28 x10E6/uL Final  . Hemoglobin 10/29/2022 8.7 (L)  11.1 - 15.9 g/dL Final  . Hematocrit 44/06/270 28.8 (L)  34.0 - 46.6 % Final  . MCV 10/29/2022 73 (L)  79 - 97 fL Final  . MCH 10/29/2022 22.1 (L)  26.6 - 33.0 pg Final  . MCHC 10/29/2022 30.2 (L)  31.5 - 35.7 g/dL Final  . RDW 53/66/4403 16.1 (H)  11.7 - 15.4 % Final  . Platelets 10/29/2022 231  150 - 450 x10E3/uL Final  . Neutrophils 10/29/2022 65  Not Estab. % Final  . Lymphs 10/29/2022 23  Not Estab. % Final  . Monocytes 10/29/2022 9  Not Estab. % Final  . Eos 10/29/2022 2  Not Estab. % Final  . Basos 10/29/2022 1  Not Estab. % Final  . Neutrophils Absolute 10/29/2022 3.6  1.4 - 7.0 x10E3/uL Final  . Lymphocytes Absolute 10/29/2022 1.3  0.7 - 3.1 x10E3/uL Final  . Monocytes Absolute 10/29/2022 0.5  0.1 - 0.9 x10E3/uL Final  . EOS (ABSOLUTE) 10/29/2022 0.1  0.0 - 0.4 x10E3/uL Final  . Basophils Absolute 10/29/2022 0.0  0.0 - 0.2 x10E3/uL Final  . Immature Granulocytes 10/29/2022 0  Not Estab. % Final  . Immature Grans (Abs) 10/29/2022 0.0  0.0 - 0.1 x10E3/uL Final  . Retic Ct Pct 10/29/2022 1.1  0.6 - 2.6 % Final  . Glucose 10/29/2022 103 (H)  70 - 99 mg/dL Final  . BUN 47/42/5956 12  8 - 27 mg/dL Final  . Creatinine, Ser 10/29/2022 0.74  0.57 - 1.00 mg/dL Final  . eGFR 38/75/6433 90  >59 mL/min/1.73 Final  . BUN/Creatinine Ratio 10/29/2022 16  12 - 28 Final  .  Sodium 10/29/2022 140  134 - 144 mmol/L Final  . Potassium 10/29/2022 4.5  3.5 - 5.2 mmol/L Final  . Chloride 10/29/2022 101  96 - 106 mmol/L Final  . CO2 10/29/2022 23  20 - 29 mmol/L Final  . Calcium 10/29/2022 9.4  8.7 - 10.3 mg/dL Final  . Total Protein 10/29/2022 6.9  6.0 -  8.5 g/dL Final  . Albumin 40/98/1191 4.5  3.9 - 4.9 g/dL Final  . Globulin, Total 10/29/2022 2.4  1.5 - 4.5 g/dL Final  . Albumin/Globulin Ratio 10/29/2022 1.9  1.2 - 2.2 Final  . Bilirubin Total 10/29/2022 <0.2  0.0 - 1.2 mg/dL Final  . Alkaline Phosphatase 10/29/2022 121  44 - 121 IU/L Final  . AST 10/29/2022 15  0 - 40 IU/L Final  . ALT 10/29/2022 9  0 - 32 IU/L Final  . Vit D, 25-Hydroxy 10/29/2022 11.2 (L)  30.0 - 100.0 ng/mL Final   Comment: Vitamin D deficiency has been defined by the Institute of Medicine and an Endocrine Society practice guideline as a level of serum 25-OH vitamin D less than 20 ng/mL (1,2). The Endocrine Society went on to further define vitamin D insufficiency as a level between 21 and 29 ng/mL (2). 1. IOM (Institute of Medicine). 2010. Dietary reference    intakes for calcium and D. [place]: The    [employer]. 2. Holick MF, Binkley Mansfield, Bischoff-Ferrari HA, et al.    Evaluation, treatment, and prevention of vitamin D    deficiency: an Endocrine Society clinical practice    guideline. JCEM. 2011 Jul; 96(7):1911-30.   Admission on 07/26/2022, Discharged on 07/30/2022  Component Date Value Ref Range Status  . WBC 07/26/2022 11.1 (H)  4.0 - 10.5 K/uL Final  . RBC 07/26/2022 4.60  3.87 - 5.11 MIL/uL Final  . Hemoglobin 07/26/2022 9.5 (L)  12.0 - 15.0 g/dL Final  . HCT 47/82/9562 30.8 (L)  36.0 - 46.0 % Final  . MCV 07/26/2022 67.0 (L)  80.0 - 100.0 fL Final  . MCH 07/26/2022 20.7 (L)  26.0 - 34.0 pg Final  . MCHC 07/26/2022 30.8  30.0 - 36.0 g/dL Final  . RDW 13/01/6577 24.9 (H)  11.5 - 15.5 % Final  . Platelets 07/26/2022 313  150 - 400 K/uL Final  . nRBC 07/26/2022 0.0  0.0 - 0.2 % Final  . Neutrophils Relative % 07/26/2022 84  % Final  . Neutro Abs 07/26/2022 9.3 (H)  1.7 - 7.7 K/uL Final  . Lymphocytes Relative 07/26/2022 10  % Final  . Lymphs Abs 07/26/2022 1.1  0.7 - 4.0 K/uL Final  . Monocytes Relative 07/26/2022 6  % Final  .  Monocytes Absolute 07/26/2022 0.7  0.1 - 1.0 K/uL Final  . Eosinophils Relative 07/26/2022 0  % Final  . Eosinophils Absolute 07/26/2022 0.0  0.0 - 0.5 K/uL Final  . Basophils Relative 07/26/2022 0  % Final  . Basophils Absolute 07/26/2022 0.0  0.0 - 0.1 K/uL Final  . Immature Granulocytes 07/26/2022 0  % Final  . Abs Immature Granulocytes 07/26/2022 0.02  0.00 - 0.07 K/uL Final  . Target Cells 07/26/2022 PRESENT   Final  . Ovalocytes 07/26/2022 PRESENT   Final   Performed at [facility], [address], [place], [place] [address]  . Sodium 07/26/2022 129 (L)  135 - 145 mmol/L Final  . Potassium 07/26/2022 3.6  3.5 - 5.1 mmol/L Final  . Chloride 07/26/2022 92 (L)  98 - 111  mmol/L Final  . CO2 07/26/2022 26  22 - 32 mmol/L Final  . Glucose, Bld 07/26/2022 112 (H)  70 - 99 mg/dL Final   Glucose reference range applies only to samples taken after fasting for at least 8 hours.  . BUN 07/26/2022 12  8 - 23 mg/dL Final  . Creatinine, Ser 07/26/2022 0.81  0.44 - 1.00 mg/dL Final  . Calcium 41/32/4401 9.7  8.9 - 10.3 mg/dL Final  . Total Protein 07/26/2022 7.3  6.5 - 8.1 g/dL Final  . Albumin 02/72/5366 4.6  3.5 - 5.0 g/dL Final  . AST 44/08/4740 20  15 - 41 U/L Final  . ALT 07/26/2022 14  0 - 44 U/L Final  . Alkaline Phosphatase 07/26/2022 85  38 - 126 U/L Final  . Total Bilirubin 07/26/2022 0.5  0.3 - 1.2 mg/dL Final  . GFR, Estimated 07/26/2022 >60  >60 mL/min Final   Comment: (NOTE) Calculated using the CKD-EPI Creatinine Equation (2021)   . Anion gap 07/26/2022 11  5 - 15 Final   Performed at [facility], [address] [place], [place] [address]  . Alcohol, Ethyl (B) 07/26/2022 <10  <10 mg/dL Final   Comment: (NOTE) Lowest detectable limit for serum alcohol is 10 mg/dL.  For medical purposes only. Performed at [facility], [address] [place], [place] [address]   . Opiates 07/26/2022 NONE DETECTED  NONE DETECTED  Final  . Cocaine 07/26/2022 NONE DETECTED  NONE DETECTED Final  . Benzodiazepines 07/26/2022 NONE DETECTED  NONE DETECTED Final  . Amphetamines 07/26/2022 POSITIVE (A)  NONE DETECTED Final  . Tetrahydrocannabinol 07/26/2022 NONE DETECTED  NONE DETECTED Final  . Barbiturates 07/26/2022 NONE DETECTED  NONE DETECTED Final   Comment: (NOTE) DRUG SCREEN FOR MEDICAL PURPOSES ONLY.  IF CONFIRMATION IS NEEDED FOR ANY PURPOSE, NOTIFY LAB WITHIN 5 DAYS.  LOWEST DETECTABLE LIMITS FOR URINE DRUG SCREEN Drug Class                     Cutoff (ng/mL) Amphetamine and metabolites    1000 Barbiturate and metabolites    200 Benzodiazepine                 200 Opiates and metabolites        300 Cocaine and metabolites        300 THC                            50 Performed at [facility], [address] [place], [place] [address]   . Salicylate Lvl 07/26/2022 <7.0 (L)  7.0 - 30.0 mg/dL Final   Performed at [facility], [address], [place], [place] [address]  . Acetaminophen (Tylenol), Serum 07/26/2022 <10 (L)  10 - 30 ug/mL Final   Comment: (NOTE) Therapeutic concentrations vary significantly. A range of 10-30 ug/mL  may be an effective concentration for many patients. However, some  are best treated at concentrations outside of this range. Acetaminophen concentrations >150 ug/mL at 4 hours after ingestion  and >50 ug/mL at 12 hours after ingestion are often associated with  toxic reactions.  Performed at [facility], [address], [place], [place] [address]   . SARS Coronavirus 2 by RT PCR 07/26/2022 NEGATIVE  NEGATIVE Final   Comment: (NOTE) SARS-CoV-2 target nucleic acids are NOT DETECTED.  The SARS-CoV-2 RNA is generally detectable in upper respiratory  specimens during the acute phase of infection. The lowest concentration of SARS-CoV-2 viral copies this assay can detect is 138 copies/mL. A negative result does not preclude  SARS-Cov-2 infection and should not be used as the sole basis for treatment or other patient management decisions. A negative result may occur with  improper specimen collection/handling, submission of specimen other than nasopharyngeal swab, presence of viral mutation(s) within the areas targeted by this assay, and inadequate number of viral copies(<138 copies/mL). A negative result must be combined with clinical observations, patient history, and epidemiological information. The expected result is Negative.  Fact Sheet for Patients:  BloggerCourse.com  Fact Sheet for Healthcare Providers:  SeriousBroker.it  This test is no                          t yet approved or cleared by the [place] FDA and  has been authorized for detection and/or diagnosis of SARS-CoV-2 by FDA under an Emergency Use Authorization (EUA). This EUA will remain  in effect (meaning this test can be used) for the duration of the COVID-19 declaration under Section 564(b)(1) of the Act, 21 U.S.C.section 360bbb-3(b)(1), unless the authorization is terminated  or revoked sooner.      . Influenza A by PCR 07/26/2022 NEGATIVE  NEGATIVE Final  . Influenza B by PCR 07/26/2022 NEGATIVE  NEGATIVE Final   Comment: (NOTE) The Xpert Xpress SARS-CoV-2/FLU/RSV plus assay is intended as an aid in the diagnosis of influenza from Nasopharyngeal swab specimens and should not be used as a sole basis for treatment. Nasal washings and aspirates are unacceptable for Xpert Xpress SARS-CoV-2/FLU/RSV testing.  Fact Sheet for Patients: BloggerCourse.com  Fact Sheet for Healthcare Providers: SeriousBroker.it  This test is not yet approved or cleared by the [place] FDA and has been authorized for detection and/or diagnosis of SARS-CoV-2 by FDA under an Emergency Use Authorization (EUA). This EUA will remain in effect  (meaning this test can be used) for the duration of the COVID-19 declaration under Section 564(b)(1) of the Act, 21 U.S.C. section 360bbb-3(b)(1), unless the authorization is terminated or revoked.    Marland Kitchen Resp Syncytial Virus by PCR 07/26/2022 NEGATIVE  NEGATIVE Final   Comment: (NOTE) Fact Sheet for Patients: BloggerCourse.com  Fact Sheet for Healthcare Providers: SeriousBroker.it  This test is not yet approved or cleared by the [place] FDA and has been authorized for detection and/or diagnosis of SARS-CoV-2 by FDA under an Emergency Use Authorization (EUA). This EUA will remain in effect (meaning this test can be used) for the duration of the COVID-19 declaration under Section 564(b)(1) of the Act, 21 U.S.C. section 360bbb-3(b)(1), unless the authorization is terminated or revoked.  Performed at [facility], [address] [place], [place] [address]   . Color, Urine 07/26/2022 YELLOW  YELLOW Final  . APPearance 07/26/2022 TURBID (A)  CLEAR Final  . Specific Gravity, Urine 07/26/2022 1.013  1.005 - 1.030 Final  . pH 07/26/2022 7.0  5.0 - 8.0 Final  . Glucose, UA 07/26/2022 NEGATIVE  NEGATIVE mg/dL Final  . Hgb urine dipstick 07/26/2022 SMALL (A)  NEGATIVE Final  . Bilirubin Urine 07/26/2022 NEGATIVE  NEGATIVE Final  . Ketones, ur 07/26/2022 NEGATIVE  NEGATIVE mg/dL Final  . Protein, ur 60/45/4098 30 (A)  NEGATIVE mg/dL Final  . Nitrite 11/91/4782 NEGATIVE  NEGATIVE Final  . Glori Luis 07/26/2022 MODERATE (A)  NEGATIVE Final  . RBC / HPF 07/26/2022 0-5  0 - 5 RBC/hpf Final  .  WBC, UA 07/26/2022 0-5  0 - 5 WBC/hpf Final  . Bacteria, UA 07/26/2022 NONE SEEN  NONE SEEN Final  . Squamous Epithelial / HPF 07/26/2022 0-5  0 - 5 /HPF Final   Performed at [facility], [address], [place], [place] [address]  . Sodium 07/27/2022 122 (L)  135 - 145 mmol/L Final   DELTA CHECK NOTED  .  Potassium 07/27/2022 3.6  3.5 - 5.1 mmol/L Final  . Chloride 07/27/2022 88 (L)  98 - 111 mmol/L Final  . CO2 07/27/2022 24  22 - 32 mmol/L Final  . Glucose, Bld 07/27/2022 105 (H)  70 - 99 mg/dL Final   Glucose reference range applies only to samples taken after fasting for at least 8 hours.  . BUN 07/27/2022 6 (L)  8 - 23 mg/dL Final  . Creatinine, Ser 07/27/2022 0.55  0.44 - 1.00 mg/dL Final  . Calcium 53/66/4403 8.7 (L)  8.9 - 10.3 mg/dL Final  . GFR, Estimated 07/27/2022 >60  >60 mL/min Final   Comment: (NOTE) Calculated using the CKD-EPI Creatinine Equation (2021)   . Anion gap 07/27/2022 10  5 - 15 Final   Performed at [facility], [address] [place], [place] [address]  . Sodium 07/27/2022 124 (L)  135 - 145 mmol/L Final  . Potassium 07/27/2022 3.9  3.5 - 5.1 mmol/L Final  . Chloride 07/27/2022 91 (L)  98 - 111 mmol/L Final  . CO2 07/27/2022 23  22 - 32 mmol/L Final  . Glucose, Bld 07/27/2022 102 (H)  70 - 99 mg/dL Final   Glucose reference range applies only to samples taken after fasting for at least 8 hours.  . BUN 07/27/2022 6 (L)  8 - 23 mg/dL Final  . Creatinine, Ser 07/27/2022 0.57  0.44 - 1.00 mg/dL Final  . Calcium 95/63/8756 8.5 (L)  8.9 - 10.3 mg/dL Final  . GFR, Estimated 07/27/2022 >60  >60 mL/min Final   Comment: (NOTE) Calculated using the CKD-EPI Creatinine Equation (2021)   . Anion gap 07/27/2022 10  5 - 15 Final   Performed at [facility], [address], [place], [place] [address]  . Sodium 07/28/2022 130 (L)  135 - 145 mmol/L Final  . Potassium 07/28/2022 3.7  3.5 - 5.1 mmol/L Final  . Chloride 07/28/2022 96 (L)  98 - 111 mmol/L Final  . CO2 07/28/2022 24  22 - 32 mmol/L Final  . Glucose, Bld 07/28/2022 96  70 - 99 mg/dL Final   Glucose reference range applies only to samples taken after fasting for at least 8 hours.  . BUN 07/28/2022 6 (L)  8 - 23 mg/dL Final  . Creatinine, Ser 07/28/2022 0.50  0.44 - 1.00  mg/dL Final  . Calcium 51/88/4166 8.9  8.9 - 10.3 mg/dL Final  . Total Protein 07/28/2022 5.4 (L)  6.5 - 8.1 g/dL Final  . Albumin 12/12/1599 3.4 (L)  3.5 - 5.0 g/dL Final  . AST 09/32/3557 17  15 - 41 U/L Final  . ALT 07/28/2022 12  0 - 44 U/L Final  . Alkaline Phosphatase 07/28/2022 84  38 - 126 U/L Final  . Total Bilirubin 07/28/2022 0.5  0.3 - 1.2 mg/dL Final  . GFR, Estimated 07/28/2022 >60  >60 mL/min Final   Comment: (NOTE) Calculated using the CKD-EPI Creatinine Equation (2021)   . Anion gap 07/28/2022 10  5 - 15 Final   Performed at [facility], [address] [place], [place] [address]  . WBC 07/28/2022 9.8  4.0 - 10.5 K/uL Final  . RBC 07/28/2022 4.57  3.87 - 5.11 MIL/uL Final  . Hemoglobin 07/28/2022 9.5 (L)  12.0 - 15.0 g/dL Final  . HCT 40/98/1191 31.1 (L)  36.0 - 46.0 % Final  . MCV 07/28/2022 68.1 (L)  80.0 - 100.0 fL Final  . MCH 07/28/2022 20.8 (L)  26.0 - 34.0 pg Final  . MCHC 07/28/2022 30.5  30.0 - 36.0 g/dL Final  . RDW 47/82/9562 25.5 (H)  11.5 - 15.5 % Final  . Platelets 07/28/2022 245  150 - 400 K/uL Final  . nRBC 07/28/2022 0.0  0.0 - 0.2 % Final   Performed at [facility], [address] [place], [place] [address]  . Sodium, Ur 07/27/2022 113  mmol/L Final   Performed at [facility], [address] [place], [place] [address]  . Osmolality, Ur 07/27/2022 312  300 - 900 mOsm/kg Final   Comment: PERFORMED AT [facility] Performed at [facility], [address], [place], [place] [address]   . Sodium 07/28/2022 129 (L)  135 - 145 mmol/L Final   Performed at [facility], [address], [place], [place] [address]  . Sodium 07/29/2022 130 (L)  135 - 145 mmol/L Final  . Potassium 07/29/2022 3.4 (L)  3.5 - 5.1 mmol/L Final  . Chloride 07/29/2022 95 (L)  98 - 111 mmol/L Final  . CO2 07/29/2022 25  22 - 32 mmol/L Final  . Glucose, Bld 07/29/2022 96  70 - 99 mg/dL  Final   Glucose reference range applies only to samples taken after fasting for at least 8 hours.  . BUN 07/29/2022 9  8 - 23 mg/dL Final  . Creatinine, Ser 07/29/2022 0.72  0.44 - 1.00 mg/dL Final  . Calcium 24/40/1027 9.1  8.9 - 10.3 mg/dL Final  . GFR, Estimated 07/29/2022 >60  >60 mL/min Final   Comment: (NOTE) Calculated using the CKD-EPI Creatinine Equation (2021)   . Anion gap 07/29/2022 10  5 - 15 Final   Performed at [facility], [address] [place], [place] [address]  . Glucose-Capillary 07/28/2022 111 (H)  70 - 99 mg/dL Final   Glucose reference range applies only to samples taken after fasting for at least 8 hours.  . Glucose-Capillary 07/29/2022 109 (H)  70 - 99 mg/dL Final   Glucose reference range applies only to samples taken after fasting for at least 8 hours.  . Glucose-Capillary 07/29/2022 128 (H)  70 - 99 mg/dL Final   Glucose reference range applies only to samples taken after fasting for at least 8 hours.  Admission on 06/24/2022, Discharged on 06/25/2022  Component Date Value Ref Range Status  . Sodium 06/24/2022 131 (L)  135 - 145 mmol/L Final  . Potassium 06/24/2022 3.7  3.5 - 5.1 mmol/L Final  . Chloride 06/24/2022 102  98 - 111 mmol/L Final  . CO2 06/24/2022 22  22 - 32 mmol/L Final  . Glucose, Bld 06/24/2022 94  70 - 99 mg/dL Final   Glucose reference range applies only to samples taken after fasting for at least 8 hours.  . BUN 06/24/2022 10  8 - 23 mg/dL Final  . Creatinine, Ser 06/24/2022 0.74  0.44 - 1.00 mg/dL Final  . Calcium 44/08/4740 8.5 (L)  8.9 - 10.3 mg/dL Final  . Total Protein 06/24/2022 6.4 (L)  6.5 -  8.1 g/dL Final  . Albumin 82/95/6213 3.6  3.5 - 5.0 g/dL Final  . AST 08/65/7846 28  15 - 41 U/L Final  . ALT 06/24/2022 19  0 - 44 U/L Final  . Alkaline Phosphatase 06/24/2022 86  38 - 126 U/L Final  . Total Bilirubin 06/24/2022 0.1 (L)  0.3 - 1.2 mg/dL Final  . GFR, Estimated 06/24/2022 >60  >60 mL/min Final    Comment: (NOTE) Calculated using the CKD-EPI Creatinine Equation (2021)   . Anion gap 06/24/2022 7  5 - 15 Final   Performed at [facility], [address], [place], [place] [address]  . Alcohol, Ethyl (B) 06/24/2022 39 (H)  <10 mg/dL Final   Comment: (NOTE) Lowest detectable limit for serum alcohol is 10 mg/dL.  For medical purposes only. Performed at [facility], [address], [place], [place] [address]   . WBC 06/24/2022 5.4  4.0 - 10.5 K/uL Final  . RBC 06/24/2022 3.63 (L)  3.87 - 5.11 MIL/uL Final  . Hemoglobin 06/24/2022 7.5 (L)  12.0 - 15.0 g/dL Final   Comment: Reticulocyte Hemoglobin testing may be clinically indicated, consider ordering this additional test [id number]   . HCT 06/24/2022 25.6 (L)  36.0 - 46.0 % Final  . MCV 06/24/2022 70.5 (L)  80.0 - 100.0 fL Final  . MCH 06/24/2022 20.7 (L)  26.0 - 34.0 pg Final  . MCHC 06/24/2022 29.3 (L)  30.0 - 36.0 g/dL Final  . RDW 25/36/6440 20.8 (H)  11.5 - 15.5 % Final  . Platelets 06/24/2022 207  150 - 400 K/uL Final  . nRBC 06/24/2022 0.0  0.0 - 0.2 % Final  . Neutrophils Relative % 06/24/2022 68  % Final  . Neutro Abs 06/24/2022 3.6  1.7 - 7.7 K/uL Final  . Lymphocytes Relative 06/24/2022 23  % Final  . Lymphs Abs 06/24/2022 1.3  0.7 - 4.0 K/uL Final  . Monocytes Relative 06/24/2022 7  % Final  . Monocytes Absolute 06/24/2022 0.4  0.1 - 1.0 K/uL Final  . Eosinophils Relative 06/24/2022 2  % Final  . Eosinophils Absolute 06/24/2022 0.1  0.0 - 0.5 K/uL Final  . Basophils Relative 06/24/2022 0  % Final  . Basophils Absolute 06/24/2022 0.0  0.0 - 0.1 K/uL Final  . Immature Granulocytes 06/24/2022 0  % Final  . Abs Immature Granulocytes 06/24/2022 0.01  0.00 - 0.07 K/uL Final   Performed at [facility], [address] [place], [place] [address]  Office Visit on 06/11/2022  Component Date Value Ref Range Status  . Glucose 06/11/2022 92  70 - 99 mg/dL Final  . BUN  59/56/3875 10  8 - 27 mg/dL Final  . Creatinine, Ser 06/11/2022 0.74  0.57 - 1.00 mg/dL Final  . eGFR 64/33/2951 90  >59 mL/min/1.73 Final  . BUN/Creatinine Ratio 06/11/2022 14  12 - 28 Final  . Sodium 06/11/2022 136  134 - 144 mmol/L Final  . Potassium 06/11/2022 5.4 (H)  3.5 - 5.2 mmol/L Final  . Chloride 06/11/2022 96  96 - 106 mmol/L Final  . CO2 06/11/2022 24  20 - 29 mmol/L Final  . Calcium 06/11/2022 9.7  8.7 - 10.3 mg/dL Final  . Total Protein 06/11/2022 6.7  6.0 - 8.5 g/dL Final  . Albumin 88/41/6606 4.5  3.9 - 4.9 g/dL Final  . Globulin, Total 06/11/2022 2.2  1.5 - 4.5 g/dL Final  . Albumin/Globulin Ratio 06/11/2022 2.0  1.2 - 2.2 Final  .  Bilirubin Total 06/11/2022 0.2  0.0 - 1.2 mg/dL Final  . Alkaline Phosphatase 06/11/2022 99  44 - 121 IU/L Final  . AST 06/11/2022 19  0 - 40 IU/L Final  . ALT 06/11/2022 9  0 - 32 IU/L Final  . TSH 06/11/2022 4.510 (H)  0.450 - 4.500 uIU/mL Final    Allergies: Capsaicin-menthol and Diclo gel [diclofenac sodium]  Medications:  Facility Ordered Medications  Medication  . acetaminophen (TYLENOL) tablet 650 mg  . alum & mag hydroxide-simeth (MAALOX/MYLANTA) 200-200-20 MG/5ML suspension 30 mL  . magnesium hydroxide (MILK OF MAGNESIA) suspension 30 mL  . hydrOXYzine (ATARAX) tablet 25 mg  . traZODone (DESYREL) tablet 50 mg  . nicotine (NICODERM CQ - dosed in mg/24 hours) patch 14 mg  . amiodarone (PACERONE) tablet 200 mg  . albuterol (PROVENTIL) (2.5 MG/3ML) 0.083% nebulizer solution 3 mL  . apixaban (ELIQUIS) tablet 5 mg  . ferrous sulfate tablet 325 mg  . fluticasone furoate-vilanterol (BREO ELLIPTA) 100-25 MCG/ACT 1 puff   And  . umeclidinium bromide (INCRUSE ELLIPTA) 62.5 MCG/ACT 1 puff  . Vitamin D (Ergocalciferol) (DRISDOL) 1.25 MG (50000 UNIT) capsule 50,000 Units  . pantoprazole (PROTONIX) EC tablet 40 mg  . gabapentin (NEURONTIN) capsule 800 mg  . [COMPLETED] traZODone (DESYREL) tablet 50 mg   PTA Medications  Medication  Sig  . amiodarone (PACERONE) 200 MG tablet Take 1 tablet (200 mg total) by mouth daily.  Marland Kitchen gabapentin (NEURONTIN) 800 MG tablet Take 1 tablet (800 mg total) by mouth 3 (three) times daily.  . hydrOXYzine (ATARAX) 50 MG tablet Take 1 tablet (50 mg total) by mouth 3 (three) times daily as needed.  Marland Kitchen albuterol (VENTOLIN HFA) 108 (90 Base) MCG/ACT inhaler Inhale 2 puffs into the lungs every 6 (six) hours as needed for wheezing or shortness of breath.  . Fluticasone-Umeclidin-Vilant (TRELEGY ELLIPTA) 100-62.5-25 MCG/ACT AEPB Inhale 1 puff into the lungs daily.  . pantoprazole (PROTONIX) 40 MG tablet Take 1 tablet (40 mg total) by mouth daily.  Marland Kitchen apixaban (ELIQUIS) 5 MG TABS tablet Take 1 tablet (5 mg total) by mouth 2 (two) times daily.  . Vitamin D, Ergocalciferol, (DRISDOL) 1.25 MG (50000 UNIT) CAPS capsule Take 1 capsule (50,000 Units total) by mouth every 7 (seven) days.  . ferrous sulfate 325 (65 FE) MG tablet Take 1 tablet (325 mg total) by mouth every other day.  Marland Kitchen QUEtiapine (SEROQUEL) 200 MG tablet TAKE 1 TABLET BY MOUTH EVERYDAY AT BEDTIME (Patient not taking: Reported on 11/11/2022)  . denosumab (PROLIA) 60 MG/ML SOSY injection Inject 60 mg into the skin every 6 (six) months.      Medical Decision Making  Patient is criteria for inpatient psychiatric admission; she will be admitted to [facility] for continuous assessment while waiting for inpatient gero-psych bed availability.    Recommendations  Based on my evaluation the patient does not appear to have an emergency medical condition.  Maricela Bo, NP 11/28/22  3:34 AM

## 2022-11-28 NOTE — ED Notes (Signed)
Patient making accusations toward staff, whenever a new person walks on the unit patient thinks the staff are talking about her, whenever redirected to encourage pt that staff are not talking about her, patient gets loud and starts cursing at staff. Pt gets irritated when staff are using the computer to document, stating, "Ya'll dont need to look at the computer yall need to look at your patient!" Redirected pt the computer is how we ensure we are giving the correct medications and we have processes we have to follow and documentation that has to be done. Patient currently sitting in recliner chair watching TV. Patient remains irritable at this time.

## 2022-11-28 NOTE — ED Notes (Signed)
Patient discharged to go to gero-psych via safe transport. All belongings returned to patient and transported with patient. Upon discharge pt denies SI/HI/AVH. She continues to talk negatively and in a condescending way to staff. Verbal redirection and support given. Patient states, "I know I am being mean, I've been mean to my family, I just miss my husband and my dog, I don't know how to live without them." Pt tearful during conversation.

## 2022-11-28 NOTE — ED Notes (Signed)
Report called to Darl Pikes RN on [place] unit at [facility].

## 2022-11-28 NOTE — Tx Team (Signed)
Initial Treatment Plan 11/28/2022 6:34 PM Karen Casey MVH:[id number]    PATIENT STRESSORS: Loss of husband 6 months ago   Medication change or noncompliance   Other: etoh abuse     PATIENT STRENGTHS: Communication skills  General fund of knowledge  Supportive family/friends    PATIENT IDENTIFIED PROBLEMS:                      DISCHARGE CRITERIA:  Ability to meet basic life and health needs Adequate post-discharge living arrangements Improved stabilization in mood, thinking, and/or behavior Medical problems require only outpatient monitoring Motivation to continue treatment in a less acute level of care Need for constant or close observation no longer present  PRELIMINARY DISCHARGE PLAN: Outpatient therapy Return to previous living arrangement  PATIENT/FAMILY INVOLVEMENT: This treatment plan has been presented to and reviewed with the patient, Karen Casey, . .  The patient and family have been given the opportunity to ask questions and make suggestions.  Bonnita Hollow, RN 11/28/2022, 6:34 PM

## 2022-11-28 NOTE — ED Notes (Signed)
Pt irritable and demanding. Arguing with staff stating, " all you do is look at that computer, I came here to get help and ya'll are not helping and giving me what I need".  She attempted to staff spilt, complaining about the previous shift/ staff. Redirected conversation. Instructed pt of the process here and how meds are ordered and given. Instructed pt that she can not be belligerent to staff. Pt took meds per order. Will continue to monitor pt for safety

## 2022-11-28 NOTE — ED Notes (Signed)
Patient was provided with a salad and juice for lunch 

## 2022-11-29 DIAGNOSIS — F332 Major depressive disorder, recurrent severe without psychotic features: Secondary | ICD-10-CM

## 2022-11-29 MED ORDER — NICOTINE POLACRILEX 2 MG MT GUM: 2.0000 mg | CHEWING_GUM | OROMUCOSAL | Status: DC | PRN

## 2022-11-29 MED ORDER — ESCITALOPRAM OXALATE 10 MG PO TABS
10.0000 mg | ORAL_TABLET | Freq: Every day | ORAL | Status: DC
  Administered 2022-11-30 – 2022-12-02 (×3): 10 mg via ORAL
  Filled 2022-11-29 (×3): qty 1

## 2022-11-29 MED ORDER — QUETIAPINE FUMARATE 200 MG PO TABS
200.0000 mg | ORAL_TABLET | Freq: Every day | ORAL | Status: DC
  Administered 2022-12-01 – 2022-12-03 (×2): 200 mg via ORAL
  Filled 2022-11-29 (×5): qty 1

## 2022-11-29 MED ORDER — IBUPROFEN 200 MG PO TABS
400.0000 mg | ORAL_TABLET | Freq: Four times a day (QID) | ORAL | Status: DC | PRN
  Administered 2022-11-29 – 2022-12-05 (×6): 400 mg via ORAL
  Filled 2022-11-29 (×6): qty 2

## 2022-11-29 NOTE — Progress Notes (Signed)
Patient educated on what HS medications she was being given. This RN explains each medication and the intended effect of each medication. Patient immediately refuses Seroquel. Patient reports she has taken Seroquel in the past and it made her feel "like I was on speed." This RN educated patient that medication will help stabilize patient's mood. Patient again refuses. This RN continued medication pass and after completing other patient's medications this RN returned to patients room and again educated on the intended effects of Seroquel and encouraged patient to try taking medication again. Patient again refuses and states "I just can't. I'm too scared it will make me even more jittery than I already am." Medication disposed.

## 2022-11-29 NOTE — Progress Notes (Signed)
   11/29/22 2200  Psych Admission Type (Psych Patients Only)  Admission Status Voluntary  Psychosocial Assessment  Patient Complaints Anxiety;Crying spells;Depression  Eye Contact Fair  Facial Expression Anxious  Affect Anxious  Speech Loud  Interaction Assertive  Motor Activity Restless  Appearance/Hygiene Unremarkable  Behavior Characteristics Anxious;Irritable  Mood Anxious;Labile  Thought Process  Coherency WDL  Content Blaming others;Delusions  Delusions Persecutory  Perception WDL  Hallucination None reported or observed  Judgment Impaired  Confusion None  Danger to Self  Current suicidal ideation? Denies

## 2022-11-29 NOTE — BHH Suicide Risk Assessment (Signed)
[facility] Admission Suicide Risk Assessment   Nursing information obtained from:  Patient Demographic factors:  Divorced or widowed, Unemployed, Living alone Current Mental Status:  NA (denies) Loss Factors:  Decline in physical health, Financial problems / change in socioeconomic status Historical Factors:  NA Risk Reduction Factors:  Sense of responsibility to family  Total Time spent with patient: 45 minutes Principal Problem: Severe recurrent major depression without psychotic features (HCC) Diagnosis:  Principal Problem:   Severe recurrent major depression without psychotic features (HCC) Active Problems:   GERD (gastroesophageal reflux disease)   COPD (chronic obstructive pulmonary disease) (HCC)   Anemia  Subjective Data: Patient seen and chart reviewed.  Patient came to the hospital after reporting depression and suicidal ideation.  Continues to report sad mood depression and hopelessness no suicidal intent in the hospital but some longer term hopelessness.  Continued Clinical Symptoms:  Alcohol Use Disorder Identification Test Final Score (AUDIT): 7 The "Alcohol Use Disorders Identification Test", Guidelines for Use in Primary Care, Second Edition.  World Science writer [facility]). Score between 0-7:  no or low risk or alcohol related problems. Score between 8-15:  moderate risk of alcohol related problems. Score between 16-19:  high risk of alcohol related problems. Score 20 or above:  warrants further diagnostic evaluation for alcohol dependence and treatment.   CLINICAL FACTORS:   Depression:   Hopelessness   Musculoskeletal: Strength & Muscle Tone: within normal limits Gait & Station: unsteady Patient leans: N/A  Psychiatric Specialty Exam:  Presentation  General Appearance:  Appropriate for Environment; Casual  Eye Contact: Good  Speech: Clear and Coherent; Normal Rate  Speech Volume: Normal  Handedness: Right   Mood and Affect   Mood: Depressed  Affect: Depressed   Thought Process  Thought Processes: Coherent; Goal Directed; Linear  Descriptions of Associations:Intact  Orientation:Full (Time, Place and Person)  Thought Content:Logical; WDL  History of Schizophrenia/Schizoaffective disorder:No  Duration of Psychotic Symptoms:No data recorded Hallucinations:Hallucinations: None  Ideas of Reference:None  Suicidal Thoughts:Suicidal Thoughts: No  Homicidal Thoughts:Homicidal Thoughts: No   Sensorium  Memory: Immediate Good; Recent Fair  Judgment: Fair  Insight: Present   Executive Functions  Concentration: Good  Attention Span: Good  Recall: Good  Fund of Knowledge: Good  Language: Good   Psychomotor Activity  Psychomotor Activity: Psychomotor Activity: Normal   Assets  Assets: Communication Skills; Desire for Improvement; Financial Resources/Insurance; Resilience; Social Support   Sleep  Sleep: Sleep: Fair    Physical Exam: Physical Exam Vitals reviewed.  Constitutional:      Appearance: Normal appearance.  HENT:     Head: Normocephalic and atraumatic.     Mouth/Throat:     Pharynx: Oropharynx is clear.  Eyes:     Pupils: Pupils are equal, round, and reactive to light.  Cardiovascular:     Rate and Rhythm: Normal rate and regular rhythm.  Pulmonary:     Effort: Pulmonary effort is normal.     Breath sounds: Normal breath sounds.  Abdominal:     General: Abdomen is flat.     Palpations: Abdomen is soft.  Musculoskeletal:        General: Normal range of motion.  Skin:    General: Skin is warm and dry.  Neurological:     General: No focal deficit present.     Mental Status: She is alert. Mental status is at baseline.  Psychiatric:        Attention and Perception: Attention normal.        Mood and  Affect: Mood is anxious and depressed. Affect is angry.        Behavior: Behavior is cooperative.        Thought Content: Thought content normal.         Cognition and Memory: Memory is impaired.    Review of Systems  Constitutional: Negative.   HENT: Negative.    Eyes: Negative.   Respiratory: Negative.    Cardiovascular: Negative.   Gastrointestinal: Negative.   Musculoskeletal: Negative.   Skin: Negative.   Neurological: Negative.   Psychiatric/Behavioral:  Positive for depression.    Blood pressure 132/61, pulse 65, temperature (!) 97.3 F (36.3 C), resp. rate 18, height 5\' 4"  (1.626 m), weight 61.7 kg, SpO2 98 %. Body mass index is 23.34 kg/m.   COGNITIVE FEATURES THAT CONTRIBUTE TO RISK:  Loss of executive function    SUICIDE RISK:   Mild:  Suicidal ideation of limited frequency, intensity, duration, and specificity.  There are no identifiable plans, no associated intent, mild dysphoria and related symptoms, good self-control (both objective and subjective assessment), few other risk factors, and identifiable protective factors, including available and accessible social support.  PLAN OF CARE: Continue 15-minute checks.  Work on treating depression.  Engage in individual and group therapy.  Ongoing assessment of dangerousness prior to discharge.  I certify that inpatient services furnished can reasonably be expected to improve the patient's condition.   Mordecai Rasmussen, MD 11/29/2022, 4:50 PM

## 2022-11-29 NOTE — H&P (Signed)
Psychiatric Admission Assessment Adult  Patient Identification: Karen Casey MRN:  [id number] Date of Evaluation:  11/29/2022 Chief Complaint:  MDD Principal Diagnosis: Severe recurrent major depression without psychotic features (HCC) Diagnosis:  Principal Problem:   Severe recurrent major depression without psychotic features (HCC) Active Problems:   GERD (gastroesophageal reflux disease)   COPD (chronic obstructive pulmonary disease) (HCC)   Anemia  History of Present Illness: Patient seen and chart reviewed.  67 year old woman with depression.  Voicing suicidal ideation.  Passive suicidal thoughts today.  Still feels very sad and depressed.  No psychotic symptoms.  Patient's husband died 6 months ago.  Subsequently she left the home they had together and has stayed with relatives.  Seems to have had trouble getting along with both her daughter and now her sister.  Cannot stand to stay with them in the same house.  Mood is gotten worse.  Irritable and sad all the time.  Denies hallucinations or psychotic symptoms.  Denies alcohol abuse.  Says that she has been following up with her regular medical care. Associated Signs/Symptoms: Depression Symptoms:  depressed mood, anhedonia, insomnia, feelings of worthlessness/guilt, difficulty concentrating, hopelessness, suicidal thoughts without plan, (Hypo) Manic Symptoms:  Impulsivity, Irritable Mood, Anxiety Symptoms:  Excessive Worry, Psychotic Symptoms:   None PTSD Symptoms: Negative Total Time spent with patient: 45 minutes  Past Psychiatric History: Patient has past history of alcohol abuse and depression previously treated several years ago.  Past history of suicidal threats  Is the patient at risk to self? Yes.    Has the patient been a risk to self in the past 6 months? Yes.    Has the patient been a risk to self within the distant past? Yes.    Is the patient a risk to others? No.  Has the patient been a risk to others in  the past 6 months? No.  Has the patient been a risk to others within the distant past? No.   [place] Scale:  Flowsheet Row Admission (Current) from 11/28/2022 in [facility] ED from 11/27/2022 in [facility] ED to Hosp-Admission (Discharged) from 07/26/2022 in [place] LONG 4TH FLOOR PROGRESSIVE CARE AND UROLOGY  C-SSRS RISK CATEGORY No Risk No Risk Low Risk        Prior Inpatient Therapy: Yes.   If yes, describe previous hospitalization in 2014 at which time it was for alcohol and benzodiazepine use Prior Outpatient Therapy: Yes.   If yes, describe has had some outpatient follow-up for these things  Alcohol Screening: Patient refused Alcohol Screening Tool: Yes 1. How often do you have a drink containing alcohol?: 2 to 3 times a week 2. How many drinks containing alcohol do you have on a typical day when you are drinking?: 3 or 4 3. How often do you have six or more drinks on one occasion?: Weekly AUDIT-C Score: 7 4. How often during the last year have you found that you were not able to stop drinking once you had started?: Never 5. How often during the last year have you failed to do what was normally expected from you because of drinking?: Never 6. How often during the last year have you needed a first drink in the morning to get yourself going after a heavy drinking session?: Never 7. How often during the last year have you had a feeling of guilt of remorse after drinking?: Never 8. How often during the last year have you been unable to remember what happened  the night before because you had been drinking?: Never 9. Have you or someone else been injured as a result of your drinking?: No 10. Has a relative or friend or a doctor or another health worker been concerned about your drinking or suggested you cut down?: No Alcohol Use Disorder Identification Test Final Score (AUDIT): 7 Alcohol Brief Interventions/Follow-up: Patient Refused,  Alcohol education/Brief advice Substance Abuse History in the last 12 months:  No. Consequences of Substance Abuse: Negative Previous Psychotropic Medications: Yes  Psychological Evaluations: Yes  Past Medical History:  Past Medical History:  Diagnosis Date   Alcohol abuse    last use 03/09/21, marijuana last 03/09/21   Allergy    Anxiety    Cataract 06/09/2012   Right eye and left eye   COPD (chronic obstructive pulmonary disease) (HCC)    Depression    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/04/2020   Due to benzodiazepine withdrawal   Dyspnea    uses oxygen 2L via [place] prn   Enterococcal bacteremia 02/10/2022   GERD (gastroesophageal reflux disease)    Headache    Hypertension    Long term (current) use of anticoagulants    Nausea vomiting and diarrhea 02/09/2022   Neuromuscular disorder (HCC)    Neuropathy    Positive blood culture 02/10/2022   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   Seizure (HCC)    08/2020 per patient   Seizures (HCC)    xanax withdrawl- December 2013   Urinary incontinence 06/09/2012    Past Surgical History:  Procedure Laterality Date   APPENDECTOMY     BIOPSY  01/04/2021   Procedure: BIOPSY;  Surgeon: Benancio Deeds, MD;  Location: [facility] ENDOSCOPY;  Service: Gastroenterology;;   BIOPSY  03/12/2021   Procedure: BIOPSY;  Surgeon: Lemar Lofty., MD;  Location: [facility] ENDOSCOPY;  Service: Gastroenterology;;   CARDIOVERSION N/A 01/28/2022   Procedure: CARDIOVERSION;  Surgeon: Yates Decamp, MD;  Location: [facility] ENDOSCOPY;  Service: Cardiovascular;  Laterality: N/A;   CATARACT EXTRACTION  06/09/2012   Left eye   COLONOSCOPY WITH PROPOFOL N/A 03/12/2021   Procedure: COLONOSCOPY WITH PROPOFOL;  Surgeon: Lemar Lofty., MD;  Location: [facility] ENDOSCOPY;  Service: Gastroenterology;  Laterality: N/A;   ESOPHAGOGASTRODUODENOSCOPY (EGD) WITH PROPOFOL N/A 01/04/2021   Procedure: ESOPHAGOGASTRODUODENOSCOPY (EGD) WITH PROPOFOL;   Surgeon: Benancio Deeds, MD;  Location: [facility] ENDOSCOPY;  Service: Gastroenterology;  Laterality: N/A;   left shoulder dislocation  Sept 2011   POLYPECTOMY  03/12/2021   Procedure: POLYPECTOMY;  Surgeon: Mansouraty, Netty Starring., MD;  Location: [facility] ENDOSCOPY;  Service: Gastroenterology;;   Family History:  Family History  Problem Relation Age of Onset   Hypertension Mother    Hyperlipidemia Mother    Aneurysm Mother        Rupture - Cause of death   Heart disease Father        MI - cause of death   Depression Father    Parkinsonism Father    Hypertension Sister    Colon cancer Neg Hx    Esophageal cancer Neg Hx    Rectal cancer Neg Hx    Stomach cancer Neg Hx    Family Psychiatric  History: Does not report Tobacco Screening:  Social History   Tobacco Use  Smoking Status Every Day   Packs/day: 0.50   Years: 43.00   Additional pack years: 0.00   Total pack years: 21.50   Types: Cigarettes  Smokeless Tobacco Never  Tobacco Comments  Tobacco info given    BH Tobacco Counseling     Are you interested in Tobacco Cessation Medications?  Yes, implement Nicotene Replacement Protocol Counseled patient on smoking cessation:  Yes Reason Tobacco Screening Not Completed: No value filed.       Social History:  Social History   Substance and Sexual Activity  Alcohol Use Yes   Alcohol/week: 3.0 standard drinks of alcohol   Types: 3 Cans of beer per week   Comment: 1-2 times week just beer     Social History   Substance and Sexual Activity  Drug Use Yes   Types: Marijuana, Other-see comments   Comment: Past hx of benzo abuse--xanax    Additional Social History:                           Allergies:   Allergies  Allergen Reactions   Capsaicin-Menthol Rash and Other (See Comments)    Burning and peeling on area applied to   Diclo Gel [Diclofenac Sodium] Rash and Other (See Comments)    Burning and peeling at the sight of application.   Lab Results:   Results for orders placed or performed during the hospital encounter of 11/27/22 (from the past 48 hour(s))  CBC with Differential/Platelet     Status: Abnormal   Collection Time: 11/27/22  9:20 PM  Result Value Ref Range   WBC 7.1 4.0 - 10.5 K/uL   RBC 4.32 3.87 - 5.11 MIL/uL   Hemoglobin 9.6 (L) 12.0 - 15.0 g/dL   HCT 09.8 (L) 11.9 - 14.7 %   MCV 74.1 (L) 80.0 - 100.0 fL   MCH 22.2 (L) 26.0 - 34.0 pg   MCHC 30.0 30.0 - 36.0 g/dL   RDW 82.9 (H) 56.2 - 13.0 %   Platelets 274 150 - 400 K/uL   nRBC 0.0 0.0 - 0.2 %   Neutrophils Relative % 73 %   Neutro Abs 5.2 1.7 - 7.7 K/uL   Lymphocytes Relative 21 %   Lymphs Abs 1.5 0.7 - 4.0 K/uL   Monocytes Relative 4 %   Monocytes Absolute 0.3 0.1 - 1.0 K/uL   Eosinophils Relative 1 %   Eosinophils Absolute 0.1 0.0 - 0.5 K/uL   Basophils Relative 1 %   Basophils Absolute 0.1 0.0 - 0.1 K/uL   nRBC 0 0 /100 WBC   Abs Immature Granulocytes 0.00 0.00 - 0.07 K/uL   Polychromasia PRESENT     Comment: Performed at [facility], [address] [place], [place] [address]  Comprehensive metabolic panel     Status: Abnormal   Collection Time: 11/27/22  9:20 PM  Result Value Ref Range   Sodium 134 (L) 135 - 145 mmol/L   Potassium 4.8 3.5 - 5.1 mmol/L   Chloride 99 98 - 111 mmol/L   CO2 22 22 - 32 mmol/L   Glucose, Bld 119 (H) 70 - 99 mg/dL    Comment: Glucose reference range applies only to samples taken after fasting for at least 8 hours.   BUN 11 8 - 23 mg/dL   Creatinine, Ser 4.69 (H) 0.44 - 1.00 mg/dL   Calcium 9.4 8.9 - 62.9 mg/dL   Total Protein 6.6 6.5 - 8.1 g/dL   Albumin 4.1 3.5 - 5.0 g/dL   AST 19 15 - 41 U/L   ALT 15 0 - 44 U/L   Alkaline Phosphatase 101 38 - 126 U/L   Total Bilirubin 0.5  0.3 - 1.2 mg/dL   GFR, Estimated 43 (L) >60 mL/min    Comment: (NOTE) Calculated using the CKD-EPI Creatinine Equation (2021)    Anion gap 13 5 - 15    Comment: Performed at [facility], [address], [place], [place] [address]   Hemoglobin A1c     Status: None   Collection Time: 11/27/22  9:20 PM  Result Value Ref Range   Hgb A1c MFr Bld 4.9 4.8 - 5.6 %    Comment: (NOTE) Pre diabetes:          5.7%-6.4%  Diabetes:              >6.4%  Glycemic control for   <7.0% adults with diabetes    Mean Plasma Glucose 93.93 mg/dL    Comment: Performed at [facility], [address] [place], [place] [address]  Ethanol     Status: None   Collection Time: 11/27/22  9:20 PM  Result Value Ref Range   Alcohol, Ethyl (B) <10 <10 mg/dL    Comment: (NOTE) Lowest detectable limit for serum alcohol is 10 mg/dL.  For medical purposes only. Performed at [facility], [address] [place], [place] [address]   Lipid panel     Status: None   Collection Time: 11/27/22  9:20 PM  Result Value Ref Range   Cholesterol 132 0 - 200 mg/dL   Triglycerides 76 <914 mg/dL   HDL 79 >78 mg/dL   Total CHOL/HDL Ratio 1.7 RATIO   VLDL 15 0 - 40 mg/dL   LDL Cholesterol 38 0 - 99 mg/dL    Comment:        Total Cholesterol/HDL:CHD Risk Coronary Heart Disease Risk Table                     Men   Women  1/2 Average Risk   3.4   3.3  Average Risk       5.0   4.4  2 X Average Risk   9.6   7.1  3 X Average Risk  23.4   11.0        Use the calculated Patient Ratio above and the CHD Risk Table to determine the patient's CHD Risk.        ATP III CLASSIFICATION (LDL):  <100     mg/dL   Optimal  295-621  mg/dL   Near or Above                    Optimal  130-159  mg/dL   Borderline  308-657  mg/dL   High  >846     mg/dL   Very High Performed at [facility], [address], [place], [place] [address]   POCT Urine Drug Screen - (I-Screen)     Status: None (Preliminary result)   Collection Time: 11/27/22  9:20 PM  Result Value Ref Range   POC Amphetamine UR None Detected NONE DETECTED (Cut Off Level 1000 ng/mL)   POC Secobarbital (BAR) None Detected NONE DETECTED (Cut Off Level 300 ng/mL)   POC Buprenorphine (BUP)  None Detected NONE DETECTED (Cut Off Level 10 ng/mL)   POC Oxazepam (BZO) None Detected NONE DETECTED (Cut Off Level 300 ng/mL)   POC Cocaine UR None Detected NONE DETECTED (Cut Off Level 300 ng/mL)   POC Methamphetamine UR None Detected NONE DETECTED (Cut Off Level 1000 ng/mL)   POC Morphine None Detected NONE  DETECTED (Cut Off Level 300 ng/mL)   POC Methadone UR None Detected NONE DETECTED (Cut Off Level 300 ng/mL)   POC Oxycodone UR None Detected NONE DETECTED (Cut Off Level 100 ng/mL)   POC Marijuana UR None Detected NONE DETECTED (Cut Off Level 50 ng/mL)  TSH     Status: Abnormal   Collection Time: 11/27/22  9:20 PM  Result Value Ref Range   TSH 6.714 (H) 0.350 - 4.500 uIU/mL    Comment: Performed by a 3rd Generation assay with a functional sensitivity of <=0.01 uIU/mL. Performed at [facility], [address] [place], [place] [address]     Blood Alcohol level:  Lab Results  Component Value Date   ETH <10 11/27/2022   ETH <10 07/26/2022    Metabolic Disorder Labs:  Lab Results  Component Value Date   HGBA1C 4.9 11/27/2022   MPG 93.93 11/27/2022   MPG 102.54 09/17/2020   No results found for: "PROLACTIN" Lab Results  Component Value Date   CHOL 132 11/27/2022   TRIG 76 11/27/2022   HDL 79 11/27/2022   CHOLHDL 1.7 11/27/2022   VLDL 15 11/27/2022   LDLCALC 38 11/27/2022   LDLCALC 42 09/17/2020    Current Medications: Current Facility-Administered Medications  Medication Dose Route Frequency Provider Last Rate Last Admin   acetaminophen (TYLENOL) tablet 650 mg  650 mg Oral Q6H PRN Lenard Lance, FNP   650 mg at 11/29/22 1009   albuterol (PROVENTIL) (2.5 MG/3ML) 0.083% nebulizer solution 3 mL  3 mL Inhalation Q6H PRN Lenard Lance, FNP       alum & mag hydroxide-simeth (MAALOX/MYLANTA) 200-200-20 MG/5ML suspension 30 mL  30 mL Oral Q4H PRN Lenard Lance, FNP       amiodarone (PACERONE) tablet 200 mg  200 mg Oral Daily Lenard Lance, FNP   200 mg at 11/29/22  0904   apixaban (ELIQUIS) tablet 5 mg  5 mg Oral BID Lenard Lance, FNP   5 mg at 11/29/22 0905   [START ON 11/30/2022] escitalopram (LEXAPRO) tablet 10 mg  10 mg Oral Daily Kire Ferg, Jackquline Denmark, MD       [START ON 11/30/2022] ferrous sulfate tablet 325 mg  325 mg Oral QODAY Allen, Tina L, FNP       fluticasone furoate-vilanterol (BREO ELLIPTA) 100-25 MCG/ACT 1 puff  1 puff Inhalation Daily Lenard Lance, FNP   1 puff at 11/29/22 1009   And   umeclidinium bromide (INCRUSE ELLIPTA) 62.5 MCG/ACT 1 puff  1 puff Inhalation Daily Lenard Lance, FNP   1 puff at 11/29/22 1008   gabapentin (NEURONTIN) capsule 800 mg  800 mg Oral TID Lenard Lance, FNP   800 mg at 11/29/22 0905   hydrOXYzine (ATARAX) tablet 25 mg  25 mg Oral TID PRN Lenard Lance, FNP   25 mg at 11/29/22 1353   ibuprofen (ADVIL) tablet 400 mg  400 mg Oral Q6H PRN Nekeya Briski, Jackquline Denmark, MD       OLANZapine zydis (ZYPREXA) disintegrating tablet 5 mg  5 mg Oral Q8H PRN Lenard Lance, FNP       And   LORazepam (ATIVAN) tablet 1 mg  1 mg Oral PRN Lenard Lance, FNP       And   ziprasidone (GEODON) injection 20 mg  20 mg Intramuscular PRN Lenard Lance, FNP       magnesium hydroxide (MILK OF MAGNESIA) suspension 30 mL  30 mL Oral  Daily PRN Lenard Lance, FNP       nicotine (NICODERM CQ - dosed in mg/24 hours) patch 14 mg  14 mg Transdermal Daily Lenard Lance, FNP   14 mg at 11/29/22 2956   nicotine polacrilex (NICORETTE) gum 2 mg  2 mg Oral PRN Domani Bakos, Jackquline Denmark, MD       pantoprazole (PROTONIX) EC tablet 40 mg  40 mg Oral Daily Lenard Lance, FNP   40 mg at 11/29/22 2130   QUEtiapine (SEROQUEL) tablet 200 mg  200 mg Oral QHS Marietta Sikkema, Jackquline Denmark, MD       traZODone (DESYREL) tablet 50 mg  50 mg Oral QHS PRN Lenard Lance, FNP   50 mg at 11/28/22 2148   [START ON 12/05/2022] Vitamin D (Ergocalciferol) (DRISDOL) 1.25 MG (50000 UNIT) capsule 50,000 Units  50,000 Units Oral Q7 days Lenard Lance, FNP       PTA Medications: Medications Prior to Admission   Medication Sig Dispense Refill Last Dose   albuterol (VENTOLIN HFA) 108 (90 Base) MCG/ACT inhaler Inhale 2 puffs into the lungs every 6 (six) hours as needed for wheezing or shortness of breath. 1 each 2    amiodarone (PACERONE) 200 MG tablet Take 1 tablet (200 mg total) by mouth daily. 90 tablet 3    apixaban (ELIQUIS) 5 MG TABS tablet Take 1 tablet (5 mg total) by mouth 2 (two) times daily. 56 tablet 12    ferrous sulfate 325 (65 FE) MG tablet Take 1 tablet (325 mg total) by mouth every other day. 60 tablet 1    Fluticasone-Umeclidin-Vilant (TRELEGY ELLIPTA) 100-62.5-25 MCG/ACT AEPB Inhale 1 puff into the lungs daily. 28 each 1    gabapentin (NEURONTIN) 800 MG tablet Take 1 tablet (800 mg total) by mouth 3 (three) times daily. 90 tablet 12    hydrOXYzine (ATARAX) 50 MG tablet Take 1 tablet (50 mg total) by mouth 3 (three) times daily as needed. 90 tablet 3    pantoprazole (PROTONIX) 40 MG tablet Take 1 tablet (40 mg total) by mouth daily. 30 tablet 4    QUEtiapine (SEROQUEL) 200 MG tablet TAKE 1 TABLET BY MOUTH EVERYDAY AT BEDTIME (Patient not taking: Reported on 11/11/2022) 90 tablet 1    Vitamin D, Ergocalciferol, (DRISDOL) 1.25 MG (50000 UNIT) CAPS capsule Take 1 capsule (50,000 Units total) by mouth every 7 (seven) days. 8 capsule 0     Musculoskeletal: Strength & Muscle Tone: within normal limits Gait & Station: normal Patient leans: N/A            Psychiatric Specialty Exam:  Presentation  General Appearance:  Appropriate for Environment; Casual  Eye Contact: Good  Speech: Clear and Coherent; Normal Rate  Speech Volume: Normal  Handedness: Right   Mood and Affect  Mood: Depressed  Affect: Depressed   Thought Process  Thought Processes: Coherent; Goal Directed; Linear  Duration of Psychotic Symptoms:N/A Past Diagnosis of Schizophrenia or Psychoactive disorder: No  Descriptions of Associations:Intact  Orientation:Full (Time, Place and  Person)  Thought Content:Logical; WDL  Hallucinations:Hallucinations: None  Ideas of Reference:None  Suicidal Thoughts:Suicidal Thoughts: No  Homicidal Thoughts:Homicidal Thoughts: No   Sensorium  Memory: Immediate Good; Recent Fair  Judgment: Fair  Insight: Present   Executive Functions  Concentration: Good  Attention Span: Good  Recall: Good  Fund of Knowledge: Good  Language: Good   Psychomotor Activity  Psychomotor Activity: Psychomotor Activity: Normal   Assets  Assets: Communication Skills; Desire for Improvement; Financial Resources/Insurance;  Resilience; Social Support   Sleep  Sleep: Sleep: Fair    Physical Exam: Physical Exam Vitals and nursing note reviewed.  Constitutional:      Appearance: Normal appearance.  HENT:     Head: Normocephalic and atraumatic.     Mouth/Throat:     Pharynx: Oropharynx is clear.  Eyes:     Pupils: Pupils are equal, round, and reactive to light.  Cardiovascular:     Rate and Rhythm: Normal rate and regular rhythm.  Pulmonary:     Effort: Pulmonary effort is normal.     Breath sounds: Normal breath sounds.  Abdominal:     General: Abdomen is flat.     Palpations: Abdomen is soft.  Musculoskeletal:        General: Normal range of motion.  Skin:    General: Skin is warm and dry.  Neurological:     General: No focal deficit present.     Mental Status: She is alert. Mental status is at baseline.  Psychiatric:        Attention and Perception: Attention normal.        Mood and Affect: Affect is labile and angry.        Speech: Speech normal.        Behavior: Behavior is cooperative.        Thought Content: Thought content includes suicidal ideation. Thought content does not include suicidal plan.        Cognition and Memory: Cognition normal.        Judgment: Judgment is impulsive.    Review of Systems  Constitutional: Negative.   HENT: Negative.    Eyes: Negative.   Respiratory: Negative.     Cardiovascular: Negative.   Gastrointestinal: Negative.   Musculoskeletal: Negative.   Skin: Negative.   Neurological: Negative.   Psychiatric/Behavioral:  Positive for depression and suicidal ideas. The patient is nervous/anxious.    Blood pressure 132/61, pulse 65, temperature (!) 97.3 F (36.3 C), resp. rate 18, height 5\' 4"  (1.626 m), weight 61.7 kg, SpO2 98 %. Body mass index is 23.34 kg/m.  Treatment Plan Summary: Medication management and Plan 67 year old woman with an ongoing depression and poor functioning ever since her husband died.  Has been treated with Seroquel and started on antidepressants but remains depressed with suicidal thoughts.  Denies any suicidal intent here on the unit.  Patient will be started back on the Seroquel which seems to have been an ongoing medicine for a couple months.  Low dose Zoloft does not seem to have been as helpful and is probably not going to be as tolerable and going to change it to 10 mg of Lexapro.  Encourage group attendance.  Right now the patient is saying she has no place to stay so we will have to continue to work with her on that.  Observation Level/Precautions:  15 minute checks  Laboratory:  Chemistry Profile  Psychotherapy:    Medications:    Consultations:    Discharge Concerns:    Estimated LOS:  Other:     Physician Treatment Plan for Primary Diagnosis: Severe recurrent major depression without psychotic features (HCC) Long Term Goal(s): Improvement in symptoms so as ready for discharge  Short Term Goals: Ability to verbalize feelings will improve, Ability to disclose and discuss suicidal ideas, and Ability to demonstrate self-control will improve  Physician Treatment Plan for Secondary Diagnosis: Principal Problem:   Severe recurrent major depression without psychotic features (HCC) Active Problems:   GERD (gastroesophageal reflux disease)  COPD (chronic obstructive pulmonary disease) (HCC)   Anemia  Long Term  Goal(s): Improvement in symptoms so as ready for discharge  Short Term Goals: Compliance with prescribed medications will improve  I certify that inpatient services furnished can reasonably be expected to improve the patient's condition.    Mordecai Rasmussen, MD 6/17/20242:56 PM

## 2022-11-29 NOTE — Group Note (Signed)
Recreation Therapy Group Note   Group Topic:Stress Management  Group Date: 11/29/2022 Start Time: 1400 End Time: 1500 Facilitators: Rosina Lowenstein, LRT, CTRS Location: Dayroom  Group Description: Stress Charades. Patients and LRT discuss the emotion "stress" and things that are associated with it. LRT encourages pts to make their own list of things that stress them out on a piece of paper provided. LRT encourages pts to identify their top 5 stressors from original list and has pts write them on smaller cut slips of paper. LRT folds and places slips into a container. Pt pulls a random slip out of the container and acts it out. LRT and peers try to guess what the peer is acting out. After all slips of paper are acted out, LRT and pts will discuss different coping skills that help them when they're feeling stressed.   Goal Area(s) Addressed: Patient will identify things that make them feel stressed.  Patient will visualize stressors by acting them out.  Patient will recognize that others experience stress. Patient will become more self-aware of stressors. Patient will discuss and learn alternative coping strategies for handling stress.  Affect/Mood: Appropriate   Participation Level: Active and Engaged   Participation Quality: Independent   Behavior: Calm and Cooperative   Speech/Thought Process: Coherent   Insight: Good   Judgement: Good   Modes of Intervention: Activity   Patient Response to Interventions:  Attentive, Engaged, Interested , and Receptive   Education Outcome:  Acknowledges education   Clinical Observations/Individualized Feedback: Saije was active in their participation of session activities and group discussion. Pt came to group late, however joined with no issue. Pt played multiple rounds of charades and acted out "depression, no human contact, and bills" as stressors. Pt interacted well with LRT and peers duration of session.   Plan: Continue to engage  patient in RT group sessions 2-3x/week.   Rosina Lowenstein, LRT, CTRS 11/29/2022 3:21 PM
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
## 2022-11-29 NOTE — Progress Notes (Signed)
Patient denies SI, HI, and AVH. She says she is grieving the loss of her husband back in December and needs help with finding a psychiatrist and working on housing. She currently lives with her sister and does not want to go back there. She endorses 6/10 pain in her back and also says her feet hurt due to neuropathy. Tylenol was given for pain. Patient is compliant with scheduled medications. She is active on the milieu and participated in group activities. Patient remains safe on the unit at this time.

## 2022-11-29 NOTE — Progress Notes (Signed)
   11/29/22 0545  Psych Admission Type (Psych Patients Only)  Admission Status Voluntary  Psychosocial Assessment  Patient Complaints Agitation;Anxiety;Crying spells  Eye Contact Fair  Facial Expression Anxious  Affect Anxious  Speech Pressured  Interaction Defensive  Motor Activity Restless  Appearance/Hygiene In scrubs  Behavior Characteristics Cooperative;Anxious;Irritable  Mood Anxious;Labile  Thought Process  Coherency WDL  Content WDL  Delusions WDL  Perception WDL  Hallucination None reported or observed  Judgment Impaired  Confusion None  Danger to Self  Current suicidal ideation? Denies  Danger to Others  Danger to Others None reported or observed

## 2022-11-29 NOTE — Group Note (Signed)
Date:  11/29/2022 Time:  10:39 PM  Group Topic/Focus:  Building Self Esteem:   The Focus of this group is helping patients become aware of the effects of self-esteem on their lives, the things they and others do that enhance or undermine their self-esteem, seeing the relationship between their level of self-esteem and the choices they make and learning ways to enhance self-esteem. Coping With Mental Health Crisis:   The purpose of this group is to help patients identify strategies for coping with mental health crisis.  Group discusses possible causes of crisis and ways to manage them effectively. Developing a Wellness Toolbox:   The focus of this group is to help patients develop a "wellness toolbox" with skills and strategies to promote recovery upon discharge. Goals Group:   The focus of this group is to help patients establish daily goals to achieve during treatment and discuss how the patient can incorporate goal setting into their daily lives to aide in recovery. Healthy Communication:   The focus of this group is to discuss communication, barriers to communication, as well as healthy ways to communicate with others. Identifying Needs:   The focus of this group is to help patients identify their personal needs that have been historically problematic and identify healthy behaviors to address their needs. Making Healthy Choices:   The focus of this group is to help patients identify negative/unhealthy choices they were using prior to admission and identify positive/healthier coping strategies to replace them upon discharge. Managing Feelings:   The focus of this group is to identify what feelings patients have difficulty handling and develop a plan to handle them in a healthier way upon discharge. Personal Choices and Values:   The focus of this group is to help patients assess and explore the importance of values in their lives, how their values affect their decisions, how they express their values  and what opposes their expression. Rediscovering Joy:   The focus of this group is to explore various ways to relieve stress in a positive manner.    Participation Level:  Active  Participation Quality:  Inattentive  Affect:  Anxious and Blunted  Cognitive:  Oriented  Insight: Lacking  Engagement in Group:  Lacking  Modes of Intervention:  Discussion and Support  Additional Comments:    Maeola Harman 11/29/2022, 10:39 PM

## 2022-11-30 ENCOUNTER — Telehealth: Payer: Self-pay

## 2022-11-30 DIAGNOSIS — F332 Major depressive disorder, recurrent severe without psychotic features: Secondary | ICD-10-CM | POA: Diagnosis not present

## 2022-11-30 LAB — BASIC METABOLIC PANEL
Anion gap: 10 (ref 5–15)
Anion gap: 10 (ref 5–15)
BUN: 14 mg/dL (ref 8–23)
BUN: 14 mg/dL (ref 8–23)
CO2: 24 mmol/L (ref 22–32)
CO2: 25 mmol/L (ref 22–32)
Calcium: 8.7 mg/dL — ABNORMAL LOW (ref 8.9–10.3)
Calcium: 9.1 mg/dL (ref 8.9–10.3)
Chloride: 97 mmol/L — ABNORMAL LOW (ref 98–111)
Chloride: 96 mmol/L — ABNORMAL LOW (ref 98–111)
Creatinine, Ser: 0.62 mg/dL (ref 0.44–1.00)
Creatinine, Ser: 0.7 mg/dL (ref 0.44–1.00)
GFR, Estimated: >60 mL/min (ref >60)
GFR, Estimated: >60 mL/min (ref >60)
Glucose, Bld: 99 mg/dL (ref 70–99)
Glucose, Bld: 127 mg/dL — ABNORMAL HIGH (ref 70–99)
Potassium: 4.5 mmol/L (ref 3.5–5.1)
Potassium: 4.6 mmol/L (ref 3.5–5.1)
Sodium: 131 mmol/L — ABNORMAL LOW (ref 135–145)
Sodium: 131 mmol/L — ABNORMAL LOW (ref 135–145)

## 2022-11-30 LAB — VITAMIN B12: Vitamin B-12: 216 pg/mL (ref 180–914)

## 2022-11-30 LAB — TSH: TSH: 4.423 u[IU]/mL (ref 0.350–4.500)

## 2022-11-30 MED ORDER — LIDOCAINE 5 % EX PTCH
1.0000 | MEDICATED_PATCH | CUTANEOUS | Status: DC
  Administered 2022-11-30 – 2022-12-06 (×7): 1 via TRANSDERMAL
  Filled 2022-11-30 (×7): qty 1

## 2022-11-30 MED ORDER — AMITRIPTYLINE HCL 10 MG PO TABS
10.0000 mg | ORAL_TABLET | Freq: Every day | ORAL | Status: DC
  Administered 2022-11-30 – 2022-12-05 (×6): 10 mg via ORAL
  Filled 2022-11-30 (×6): qty 1

## 2022-11-30 MED ORDER — CLONAZEPAM 0.5 MG PO TABS
0.5000 mg | ORAL_TABLET | Freq: Two times a day (BID) | ORAL | Status: DC
  Administered 2022-11-30 – 2022-12-03 (×7): 0.5 mg via ORAL
  Filled 2022-11-30 (×7): qty 1

## 2022-11-30 NOTE — Plan of Care (Signed)

## 2022-11-30 NOTE — [facility] Group Notes (Signed)
[facility] Group Notes:  (Nursing/MHT/Case Management/Adjunct)  Date:  11/30/2022  Time:  10:39 AM  Type of Therapy:   outside rec  Participation Level:  Did Not Attend    Rodena Goldmann 11/30/2022, 10:39 AM

## 2022-11-30 NOTE — Group Note (Signed)
Recreation Therapy Group Note   Group Topic:Communication  Group Date: 11/30/2022 Start Time: 1400 End Time: 1445 Facilitators: Rosina Lowenstein, LRT, CTRS Location:  Dayroom  Group Description: Name That Food. Pt will randomly select 1 cut paper from laminated stack of popular food images from the 1970's-1990's from LRT. Pt will use descriptive words to explain what food they selected. Once the food is correctly guessed, LRT and pts will reminisce and discuss past fond memories associated with the food. LRT and pts will go through as many rounds as the cards allow before running out.   Goal Area(s) Addressed: Patient will increase communication skills.  Patient will increase frustration tolerance skills. Patient will reminisce a fond memory in their life.    Affect/Mood: N/A   Participation Level: Did not attend    Clinical Observations/Individualized Feedback: Karen Casey did not attend group due to meeting with social work.  Plan: Continue to engage patient in RT group sessions 2-3x/week.   Rosina Lowenstein, LRT, CTRS 11/30/2022 3:37 PM

## 2022-11-30 NOTE — [facility] Suicide Risk Assessment (Signed)
[facility] INPATIENT:  Family/Significant Other Suicide Prevention Education  Suicide Prevention Education:  Patient Refusal for Family/Significant Other Suicide Prevention Education: The patient Karen Casey has refused to provide written consent for family/significant other to be provided Family/Significant Other Suicide Prevention Education during admission and/or prior to discharge.  Physician notified.  SPE completed with pt, as pt refused to consent to family contact. SPI pamphlet provided to pt and pt was encouraged to share information with support network, ask questions, and talk about any concerns relating to SPE. Pt denies access to guns/firearms and verbalized understanding of information provided. Mobile Crisis information also provided to pt.   Harden Mo 11/30/2022, 4:48 PM

## 2022-11-30 NOTE — Telephone Encounter (Signed)
Attempted to call patient to schedule prolia injection.   She did not answer, LVM to return call to schedule.   Per chart review, patient is currently admitted in the hospital at [facility].   Will attempt to reach patient at later time.   Veronda Prude, RN

## 2022-11-30 NOTE — Telephone Encounter (Signed)
Patients sister calls nurse line requesting to speak with PCP.   She reports [name] in currently in a [facility]. She reports she does not feel like this place is good for [name]. She reports she feels the environment is making [name] worse.   She asks PCP call her at her convenience to discuss other options.   [phone]  Will forward to PCP.

## 2022-11-30 NOTE — Progress Notes (Signed)
   11/30/22 0707  Psych Admission Type (Psych Patients Only)  Admission Status Voluntary  Psychosocial Assessment  Patient Complaints Anxiety;Depression  Eye Contact Fair  Facial Expression Anxious  Affect Labile;Anxious  Speech Logical/coherent  Interaction Assertive  Motor Activity Slow  Appearance/Hygiene Unremarkable  Behavior Characteristics Cooperative;Irritable  Mood Irritable  Thought Process  Coherency WDL  Content Blaming others  Delusions None reported or observed  Perception WDL  Hallucination None reported or observed  Judgment Impaired  Confusion None  Danger to Self  Current suicidal ideation? Denies  Danger to Others  Danger to Others None reported or observed

## 2022-11-30 NOTE — Progress Notes (Signed)
[facility] MD Progress Note  11/30/2022 9:59 AM Karen Casey  MRN:  [id number] Subjective: Patient seen and chart reviewed.  Patient has multiple complaints.  She complains about the staff here at length.  Complains that she feels like nobody is doing the job right.  As far as her physical complaints she is upset about her neuropathy in her feet.  States that the gabapentin has not been of any help at all.  Says that her walking continues to be impaired by it.  Patient also says she needs something for her "nerves" and for her "anxiety".  She denies any suicidal thoughts whatsoever. Principal Problem: Severe recurrent major depression without psychotic features (HCC) Diagnosis: Principal Problem:   Severe recurrent major depression without psychotic features (HCC) Active Problems:   GERD (gastroesophageal reflux disease)   COPD (chronic obstructive pulmonary disease) (HCC)   Anemia  Total Time spent with patient: 30 minutes  Past Psychiatric History: Past history of outpatient treatment for anxiety and depression  Past Medical History:  Past Medical History:  Diagnosis Date   Alcohol abuse    last use 03/09/21, marijuana last 03/09/21   Allergy    Anxiety    Cataract 06/09/2012   Right eye and left eye   COPD (chronic obstructive pulmonary disease) (HCC)    Depression    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/04/2020   Due to benzodiazepine withdrawal   Dyspnea    uses oxygen 2L via [place] prn   Enterococcal bacteremia 02/10/2022   GERD (gastroesophageal reflux disease)    Headache    Hypertension    Long term (current) use of anticoagulants    Nausea vomiting and diarrhea 02/09/2022   Neuromuscular disorder (HCC)    Neuropathy    Positive blood culture 02/10/2022   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   Seizure (HCC)    08/2020 per patient   Seizures (HCC)    xanax withdrawl- December 2013   Urinary incontinence 06/09/2012    Past Surgical  History:  Procedure Laterality Date   APPENDECTOMY     BIOPSY  01/04/2021   Procedure: BIOPSY;  Surgeon: Benancio Deeds, MD;  Location: [facility] ENDOSCOPY;  Service: Gastroenterology;;   BIOPSY  03/12/2021   Procedure: BIOPSY;  Surgeon: Lemar Lofty., MD;  Location: [facility] ENDOSCOPY;  Service: Gastroenterology;;   CARDIOVERSION N/A 01/28/2022   Procedure: CARDIOVERSION;  Surgeon: Yates Decamp, MD;  Location: [facility] ENDOSCOPY;  Service: Cardiovascular;  Laterality: N/A;   CATARACT EXTRACTION  06/09/2012   Left eye   COLONOSCOPY WITH PROPOFOL N/A 03/12/2021   Procedure: COLONOSCOPY WITH PROPOFOL;  Surgeon: Lemar Lofty., MD;  Location: [facility] ENDOSCOPY;  Service: Gastroenterology;  Laterality: N/A;   ESOPHAGOGASTRODUODENOSCOPY (EGD) WITH PROPOFOL N/A 01/04/2021   Procedure: ESOPHAGOGASTRODUODENOSCOPY (EGD) WITH PROPOFOL;  Surgeon: Benancio Deeds, MD;  Location: [facility] ENDOSCOPY;  Service: Gastroenterology;  Laterality: N/A;   left shoulder dislocation  Sept 2011   POLYPECTOMY  03/12/2021   Procedure: POLYPECTOMY;  Surgeon: Mansouraty, Netty Starring., MD;  Location: [facility] ENDOSCOPY;  Service: Gastroenterology;;   Family History:  Family History  Problem Relation Age of Onset   Hypertension Mother    Hyperlipidemia Mother    Aneurysm Mother        Rupture - Cause of death   Heart disease Father        MI - cause of death   Depression Father    Parkinsonism Father    Hypertension Sister  Colon cancer Neg Hx    Esophageal cancer Neg Hx    Rectal cancer Neg Hx    Stomach cancer Neg Hx    Family Psychiatric  History: See previous Social History:  Social History   Substance and Sexual Activity  Alcohol Use Yes   Alcohol/week: 3.0 standard drinks of alcohol   Types: 3 Cans of beer per week   Comment: 1-2 times week just beer     Social History   Substance and Sexual Activity  Drug Use Yes   Types: Marijuana, Other-see comments   Comment: Past hx of benzo abuse--xanax     Social History   Socioeconomic History   Marital status: Single    Spouse name: Not on file   Number of children: 1   Years of education: Not on file   Highest education level: Not on file  Occupational History   Not on file  Tobacco Use   Smoking status: Every Day    Packs/day: 0.50    Years: 43.00    Additional pack years: 0.00    Total pack years: 21.50    Types: Cigarettes   Smokeless tobacco: Never   Tobacco comments:    Tobacco info given  Vaping Use   Vaping Use: Never used  Substance and Sexual Activity   Alcohol use: Yes    Alcohol/week: 3.0 standard drinks of alcohol    Types: 3 Cans of beer per week    Comment: 1-2 times week just beer   Drug use: Yes    Types: Marijuana, Other-see comments    Comment: Past hx of benzo abuse--xanax   Sexual activity: Not on file  Other Topics Concern   Not on file  Social History Narrative   Not on file   Social Determinants of Health   Financial Resource Strain: Medium Risk (10/23/2022)   Overall Financial Resource Strain (CARDIA)    Difficulty of Paying Living Expenses: Somewhat hard  Food Insecurity: No Food Insecurity (11/28/2022)   Hunger Vital Sign    Worried About Running Out of Food in the Last Year: Never true    Ran Out of Food in the Last Year: Never true  Transportation Needs: Unmet Transportation Needs (11/28/2022)   PRAPARE - Transportation    Lack of Transportation (Medical): Yes    Lack of Transportation (Non-Medical): Yes  Physical Activity: Inactive (10/23/2022)   Exercise Vital Sign    Days of Exercise per Week: 0 days    Minutes of Exercise per Session: 0 min  Stress: Stress Concern Present (10/23/2022)   [employer] of Occupational Health - Occupational Stress Questionnaire    Feeling of Stress : Very much  Social Connections: Socially Isolated (10/23/2022)   Social Connection and Isolation Panel [NHANES]    Frequency of Communication with Friends and Family: Three times a week    Frequency  of Social Gatherings with Friends and Family: Once a week    Attends Religious Services: Never    Database administrator or Organizations: No    Attends Banker Meetings: Never    Marital Status: Widowed   Additional Social History:                         Sleep: Fair  Appetite:  Fair  Current Medications: Current Facility-Administered Medications  Medication Dose Route Frequency Provider Last Rate Last Admin   acetaminophen (TYLENOL) tablet 650 mg  650 mg Oral Q6H PRN Lenard Lance,  FNP   650 mg at 11/30/22 0657   albuterol (PROVENTIL) (2.5 MG/3ML) 0.083% nebulizer solution 3 mL  3 mL Inhalation Q6H PRN Lenard Lance, FNP       alum & mag hydroxide-simeth (MAALOX/MYLANTA) 200-200-20 MG/5ML suspension 30 mL  30 mL Oral Q4H PRN Lenard Lance, FNP       amiodarone (PACERONE) tablet 200 mg  200 mg Oral Daily Lenard Lance, FNP   200 mg at 11/30/22 6578   amitriptyline (ELAVIL) tablet 10 mg  10 mg Oral QHS Imonie Tuch, Jackquline Denmark, MD       apixaban Everlene Balls) tablet 5 mg  5 mg Oral BID Lenard Lance, FNP   5 mg at 11/30/22 4696   clonazePAM (KLONOPIN) tablet 0.5 mg  0.5 mg Oral BID Ioan Landini, Jackquline Denmark, MD       escitalopram (LEXAPRO) tablet 10 mg  10 mg Oral Daily Azul Brumett, Jackquline Denmark, MD   10 mg at 11/30/22 2952   ferrous sulfate tablet 325 mg  325 mg Oral Otelia Santee, FNP   325 mg at 11/30/22 0834   fluticasone furoate-vilanterol (BREO ELLIPTA) 100-25 MCG/ACT 1 puff  1 puff Inhalation Daily Lenard Lance, FNP   1 puff at 11/30/22 0835   And   umeclidinium bromide (INCRUSE ELLIPTA) 62.5 MCG/ACT 1 puff  1 puff Inhalation Daily Lenard Lance, FNP   1 puff at 11/30/22 0835   gabapentin (NEURONTIN) capsule 800 mg  800 mg Oral TID Lenard Lance, FNP   800 mg at 11/30/22 0834   hydrOXYzine (ATARAX) tablet 25 mg  25 mg Oral TID PRN Lenard Lance, FNP   25 mg at 11/30/22 0657   ibuprofen (ADVIL) tablet 400 mg  400 mg Oral Q6H PRN Druscilla Petsch, Jackquline Denmark, MD   400 mg at 11/29/22 2128    lidocaine (LIDODERM) 5 % 1 patch  1 patch Transdermal Q24H Hayes Czaja, Jackquline Denmark, MD       OLANZapine zydis (ZYPREXA) disintegrating tablet 5 mg  5 mg Oral Q8H PRN Lenard Lance, FNP       And   LORazepam (ATIVAN) tablet 1 mg  1 mg Oral PRN Lenard Lance, FNP       And   ziprasidone (GEODON) injection 20 mg  20 mg Intramuscular PRN Lenard Lance, FNP       magnesium hydroxide (MILK OF MAGNESIA) suspension 30 mL  30 mL Oral Daily PRN Lenard Lance, FNP       nicotine (NICODERM CQ - dosed in mg/24 hours) patch 14 mg  14 mg Transdermal Daily Lenard Lance, FNP   14 mg at 11/30/22 8413   nicotine polacrilex (NICORETTE) gum 2 mg  2 mg Oral PRN Jeany Seville, Jackquline Denmark, MD       pantoprazole (PROTONIX) EC tablet 40 mg  40 mg Oral Daily Lenard Lance, FNP   40 mg at 11/30/22 0850   QUEtiapine (SEROQUEL) tablet 200 mg  200 mg Oral QHS Sequoia Mincey T, MD       traZODone (DESYREL) tablet 50 mg  50 mg Oral QHS PRN Lenard Lance, FNP   50 mg at 11/29/22 2128   [START ON 12/05/2022] Vitamin D (Ergocalciferol) (DRISDOL) 1.25 MG (50000 UNIT) capsule 50,000 Units  50,000 Units Oral Q7 days Lenard Lance, FNP        Lab Results: No results found for this or any previous visit (from the past 48 hour(s)).  Blood Alcohol level:  Lab Results  Component Value Date   ETH <10 11/27/2022   ETH <10 07/26/2022    Metabolic Disorder Labs: Lab Results  Component Value Date   HGBA1C 4.9 11/27/2022   MPG 93.93 11/27/2022   MPG 102.54 09/17/2020   No results found for: "PROLACTIN" Lab Results  Component Value Date   CHOL 132 11/27/2022   TRIG 76 11/27/2022   HDL 79 11/27/2022   CHOLHDL 1.7 11/27/2022   VLDL 15 11/27/2022   LDLCALC 38 11/27/2022   LDLCALC 42 09/17/2020    Physical Findings: AIMS: Facial and Oral Movements Muscles of Facial Expression: None, normal Lips and Perioral Area: None, normal Jaw: None, normal Tongue: None, normal,Extremity Movements Upper (arms, wrists, hands, fingers): None, normal Lower  (legs, knees, ankles, toes): None, normal, Trunk Movements Neck, shoulders, hips: None, normal, Overall Severity Severity of abnormal movements (highest score from questions above): None, normal Incapacitation due to abnormal movements: None, normal Patient's awareness of abnormal movements (rate only patient's report): No Awareness, Dental Status Current problems with teeth and/or dentures?: No Does patient usually wear dentures?: No  CIWA:    COWS:     Musculoskeletal: Strength & Muscle Tone: within normal limits Gait & Station: unsteady Patient leans: N/A  Psychiatric Specialty Exam:  Presentation  General Appearance:  Appropriate for Environment; Casual  Eye Contact: Good  Speech: Clear and Coherent; Normal Rate  Speech Volume: Normal  Handedness: Right   Mood and Affect  Mood: Depressed  Affect: Depressed   Thought Process  Thought Processes: Coherent; Goal Directed; Linear  Descriptions of Associations:Intact  Orientation:Full (Time, Place and Person)  Thought Content:Logical; WDL  History of Schizophrenia/Schizoaffective disorder:No  Duration of Psychotic Symptoms:No data recorded Hallucinations:No data recorded Ideas of Reference:None  Suicidal Thoughts:No data recorded Homicidal Thoughts:No data recorded  Sensorium  Memory: Immediate Good; Recent Fair  Judgment: Fair  Insight: Present   Executive Functions  Concentration: Good  Attention Span: Good  Recall: Good  Fund of Knowledge: Good  Language: Good   Psychomotor Activity  Psychomotor Activity:No data recorded  Assets  Assets: Communication Skills; Desire for Improvement; Financial Resources/Insurance; Resilience; Social Support   Sleep  Sleep:No data recorded   Physical Exam: Physical Exam Vitals and nursing note reviewed.  Constitutional:      Appearance: Normal appearance.  HENT:     Head: Normocephalic and atraumatic.     Mouth/Throat:      Pharynx: Oropharynx is clear.  Eyes:     Pupils: Pupils are equal, round, and reactive to light.  Cardiovascular:     Rate and Rhythm: Normal rate and regular rhythm.  Pulmonary:     Effort: Pulmonary effort is normal.     Breath sounds: Normal breath sounds.  Abdominal:     General: Abdomen is flat.     Palpations: Abdomen is soft.  Musculoskeletal:        General: Normal range of motion.  Skin:    General: Skin is warm and dry.  Neurological:     General: No focal deficit present.     Mental Status: She is alert. Mental status is at baseline.  Psychiatric:        Attention and Perception: Attention normal.        Mood and Affect: Affect is angry.        Speech: Speech normal.        Behavior: Behavior is cooperative.        Thought Content: Thought content normal.  Cognition and Memory: Cognition normal.    Review of Systems  Constitutional: Negative.   HENT: Negative.    Eyes: Negative.   Respiratory: Negative.    Cardiovascular: Negative.   Gastrointestinal: Negative.   Musculoskeletal: Negative.   Skin: Negative.   Neurological:  Positive for tingling.  Psychiatric/Behavioral:  Positive for depression. The patient is nervous/anxious.    Blood pressure (!) 153/73, pulse 73, temperature 98 F (36.7 C), temperature source Oral, resp. rate 17, height 5\' 4"  (1.626 m), weight 61.7 kg, SpO2 98 %. Body mass index is 23.34 kg/m.   Treatment Plan Summary: Plan I had asked to have a basic metabolic panel drawn this morning so that we could address this issue about her kidney function.  Apparently that was not done.  Therefore I have had to put in a new order for a stat test.  While getting in I will also order a vitamin B12 level and a thyroid panel just to look for some other things we have not checked on that could affect her symptoms.  For her back pain I suggest that we go with the pharmacist suggestion of the lidocaine patch daily.  I explained to her that there was  not a great treatment for the neuropathy in addition to the gabapentin but we can try adding 10 mg of amitriptyline at night.  I did agree to give her a small dose of clonazepam for her anxiety half milligram twice a day.  Patient aware of the sedating risk of it.  Because she is completely denying suicidal ideation and is not psychotic and states that she can go to a boardinghouse I proposed to her that she might be happier if we plan for discharge tomorrow.  Especially given her complaints about the quality of care and the food.  Patient however declined that and says she wants to stay until Friday.  Mordecai Rasmussen, MD 11/30/2022, 9:59 AM

## 2022-12-01 DIAGNOSIS — F332 Major depressive disorder, recurrent severe without psychotic features: Secondary | ICD-10-CM | POA: Diagnosis not present

## 2022-12-01 NOTE — Group Note (Signed)
Date:  12/01/2022 Time:  8:41 PM  Group Topic/Focus:  Crisis Planning:   The purpose of this group is to help patients create a crisis plan for use upon discharge or in the future, as needed.    Participation Level:  Active  Participation Quality:  Appropriate  Affect:  Appropriate  Cognitive:  Alert  Insight: Appropriate  Engagement in Group:  Engaged  Modes of Intervention:  Education  Additional Comments:    Criss Pallone 12/01/2022, 8:41 PM  

## 2022-12-01 NOTE — Progress Notes (Signed)
D: Patient alert and oriented times 4. Pt denies SI, HI, AVH. Pt rates pain  6/10 to back- PRN Advil/Tylenol given. Pt endorses anxiety and depression. Patient has numerous complaints and cannot be satisfied.   A: Pt provided support and encouragement throughout the day. Scheduled medications given as prescribed. Takes medications whole without issue.     R: Pt remains safe on the unit with Q15 min safety checks in place. Will continue to monitor for changes.

## 2022-12-01 NOTE — Group Note (Signed)
Recreation Therapy Group Note   Group Topic:Relaxation  Group Date: 12/01/2022 Start Time: 1400 End Time: 1445 Facilitators: Rosina Lowenstein, LRT, CTRS Location:  Dayroom  Group Description: PMR (Progressive Muscle Relaxation). LRT asks patients their current level of stress/anxiety from 1-10, with 10 being the highest. LRT educates patients on what PMR is and the benefits that come from it. Patients are asked to sit with their feet flat on the floor while sitting up and all the way back in their chair, if possible. LRT and pts follow a prompt through a speaker that requires you to tense and release different muscles in their body and focus on their breathing. During session, lights are off and soft music is being played. At the end of the prompt, LRT asks patients to rank their current levels of stress/anxiety from 1-10, 10 being the highest.   Goal Area(s) Addressed:  Patients will be able to describe progressive muscle relaxation.  Patient will practice using relaxation technique. Patient will identify a new coping skill.  Patient will follow multistep directions to reduce anxiety and stress.  Affect/Mood: N/A   Participation Level: Did not attend    Clinical Observations/Individualized Feedback: Karen Casey did not attend group due to resting in her room.  Plan: Continue to engage patient in RT group sessions 2-3x/week.   Rosina Lowenstein, LRT, CTRS 12/01/2022 3:23 PM

## 2022-12-01 NOTE — Progress Notes (Signed)
[facility] MD Progress Note  12/01/2022 5:35 PM Karen Casey  MRN:  [id number] Subjective: Patient seen and chart reviewed.  Patient reports mood is stable.  Disgruntled but no suicidal ideation.  Does not think the lidocaine patch has helped at all. Principal Problem: Severe recurrent major depression without psychotic features (HCC) Diagnosis: Principal Problem:   Severe recurrent major depression without psychotic features (HCC) Active Problems:   GERD (gastroesophageal reflux disease)   COPD (chronic obstructive pulmonary disease) (HCC)   Anemia  Total Time spent with patient: 30 minutes  Past Psychiatric History: Past history of depression  Past Medical History:  Past Medical History:  Diagnosis Date   Alcohol abuse    last use 03/09/21, marijuana last 03/09/21   Allergy    Anxiety    Cataract 06/09/2012   Right eye and left eye   COPD (chronic obstructive pulmonary disease) (HCC)    Depression    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/04/2020   Due to benzodiazepine withdrawal   Dyspnea    uses oxygen 2L via [place] prn   Enterococcal bacteremia 02/10/2022   GERD (gastroesophageal reflux disease)    Headache    Hypertension    Long term (current) use of anticoagulants    Nausea vomiting and diarrhea 02/09/2022   Neuromuscular disorder (HCC)    Neuropathy    Positive blood culture 02/10/2022   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   Seizure (HCC)    08/2020 per patient   Seizures (HCC)    xanax withdrawl- December 2013   Urinary incontinence 06/09/2012    Past Surgical History:  Procedure Laterality Date   APPENDECTOMY     BIOPSY  01/04/2021   Procedure: BIOPSY;  Surgeon: Benancio Deeds, MD;  Location: [facility] ENDOSCOPY;  Service: Gastroenterology;;   BIOPSY  03/12/2021   Procedure: BIOPSY;  Surgeon: Lemar Lofty., MD;  Location: [facility] ENDOSCOPY;  Service: Gastroenterology;;   CARDIOVERSION N/A 01/28/2022   Procedure: CARDIOVERSION;   Surgeon: Yates Decamp, MD;  Location: [facility] ENDOSCOPY;  Service: Cardiovascular;  Laterality: N/A;   CATARACT EXTRACTION  06/09/2012   Left eye   COLONOSCOPY WITH PROPOFOL N/A 03/12/2021   Procedure: COLONOSCOPY WITH PROPOFOL;  Surgeon: Lemar Lofty., MD;  Location: [facility] ENDOSCOPY;  Service: Gastroenterology;  Laterality: N/A;   ESOPHAGOGASTRODUODENOSCOPY (EGD) WITH PROPOFOL N/A 01/04/2021   Procedure: ESOPHAGOGASTRODUODENOSCOPY (EGD) WITH PROPOFOL;  Surgeon: Benancio Deeds, MD;  Location: [facility] ENDOSCOPY;  Service: Gastroenterology;  Laterality: N/A;   left shoulder dislocation  Sept 2011   POLYPECTOMY  03/12/2021   Procedure: POLYPECTOMY;  Surgeon: Mansouraty, Netty Starring., MD;  Location: [facility] ENDOSCOPY;  Service: Gastroenterology;;   Family History:  Family History  Problem Relation Age of Onset   Hypertension Mother    Hyperlipidemia Mother    Aneurysm Mother        Rupture - Cause of death   Heart disease Father        MI - cause of death   Depression Father    Parkinsonism Father    Hypertension Sister    Colon cancer Neg Hx    Esophageal cancer Neg Hx    Rectal cancer Neg Hx    Stomach cancer Neg Hx    Family Psychiatric  History: See previous Social History:  Social History   Substance and Sexual Activity  Alcohol Use Yes   Alcohol/week: 3.0 standard drinks of alcohol   Types: 3 Cans of beer per week  Comment: 1-2 times week just beer     Social History   Substance and Sexual Activity  Drug Use Yes   Types: Marijuana, Other-see comments   Comment: Past hx of benzo abuse--xanax    Social History   Socioeconomic History   Marital status: Single    Spouse name: Not on file   Number of children: 1   Years of education: Not on file   Highest education level: Not on file  Occupational History   Not on file  Tobacco Use   Smoking status: Every Day    Packs/day: 0.50    Years: 43.00    Additional pack years: 0.00    Total pack years: 21.50    Types:  Cigarettes   Smokeless tobacco: Never   Tobacco comments:    Tobacco info given  Vaping Use   Vaping Use: Never used  Substance and Sexual Activity   Alcohol use: Yes    Alcohol/week: 3.0 standard drinks of alcohol    Types: 3 Cans of beer per week    Comment: 1-2 times week just beer   Drug use: Yes    Types: Marijuana, Other-see comments    Comment: Past hx of benzo abuse--xanax   Sexual activity: Not on file  Other Topics Concern   Not on file  Social History Narrative   Not on file   Social Determinants of Health   Financial Resource Strain: Medium Risk (10/23/2022)   Overall Financial Resource Strain (CARDIA)    Difficulty of Paying Living Expenses: Somewhat hard  Food Insecurity: No Food Insecurity (11/28/2022)   Hunger Vital Sign    Worried About Running Out of Food in the Last Year: Never true    Ran Out of Food in the Last Year: Never true  Transportation Needs: Unmet Transportation Needs (11/28/2022)   PRAPARE - Transportation    Lack of Transportation (Medical): Yes    Lack of Transportation (Non-Medical): Yes  Physical Activity: Inactive (10/23/2022)   Exercise Vital Sign    Days of Exercise per Week: 0 days    Minutes of Exercise per Session: 0 min  Stress: Stress Concern Present (10/23/2022)   [employer] of Occupational Health - Occupational Stress Questionnaire    Feeling of Stress : Very much  Social Connections: Socially Isolated (10/23/2022)   Social Connection and Isolation Panel [NHANES]    Frequency of Communication with Friends and Family: Three times a week    Frequency of Social Gatherings with Friends and Family: Once a week    Attends Religious Services: Never    Database administrator or Organizations: No    Attends Banker Meetings: Never    Marital Status: Widowed   Additional Social History:                         Sleep: Fair  Appetite:  Fair  Current Medications: Current Facility-Administered Medications   Medication Dose Route Frequency Provider Last Rate Last Admin   acetaminophen (TYLENOL) tablet 650 mg  650 mg Oral Q6H PRN Lenard Lance, FNP   650 mg at 12/01/22 1644   albuterol (PROVENTIL) (2.5 MG/3ML) 0.083% nebulizer solution 3 mL  3 mL Inhalation Q6H PRN Lenard Lance, FNP       alum & mag hydroxide-simeth (MAALOX/MYLANTA) 200-200-20 MG/5ML suspension 30 mL  30 mL Oral Q4H PRN Lenard Lance, FNP       amiodarone (PACERONE) tablet 200 mg  200  mg Oral Daily Lenard Lance, FNP   200 mg at 12/01/22 0910   amitriptyline (ELAVIL) tablet 10 mg  10 mg Oral QHS Nalina Yeatman, Jackquline Denmark, MD   10 mg at 11/30/22 2113   apixaban (ELIQUIS) tablet 5 mg  5 mg Oral BID Lenard Lance, FNP   5 mg at 12/01/22 0910   clonazePAM (KLONOPIN) tablet 0.5 mg  0.5 mg Oral BID Joeangel Jeanpaul T, MD   0.5 mg at 12/01/22 0910   escitalopram (LEXAPRO) tablet 10 mg  10 mg Oral Daily Niko Jakel, Jackquline Denmark, MD   10 mg at 12/01/22 0910   ferrous sulfate tablet 325 mg  325 mg Oral Otelia Santee, FNP   325 mg at 11/30/22 0834   fluticasone furoate-vilanterol (BREO ELLIPTA) 100-25 MCG/ACT 1 puff  1 puff Inhalation Daily Lenard Lance, FNP   1 puff at 12/01/22 0911   And   umeclidinium bromide (INCRUSE ELLIPTA) 62.5 MCG/ACT 1 puff  1 puff Inhalation Daily Lenard Lance, FNP   1 puff at 12/01/22 0911   gabapentin (NEURONTIN) capsule 800 mg  800 mg Oral TID Lenard Lance, FNP   800 mg at 12/01/22 1643   hydrOXYzine (ATARAX) tablet 25 mg  25 mg Oral TID PRN Lenard Lance, FNP   25 mg at 12/01/22 1644   ibuprofen (ADVIL) tablet 400 mg  400 mg Oral Q6H PRN Monserat Prestigiacomo, Jackquline Denmark, MD   400 mg at 12/01/22 0910   lidocaine (LIDODERM) 5 % 1 patch  1 patch Transdermal Q24H Moataz Tavis, Jackquline Denmark, MD   1 patch at 12/01/22 0908   OLANZapine zydis (ZYPREXA) disintegrating tablet 5 mg  5 mg Oral Q8H PRN Lenard Lance, FNP       And   LORazepam (ATIVAN) tablet 1 mg  1 mg Oral PRN Lenard Lance, FNP       And   ziprasidone (GEODON) injection 20 mg  20 mg  Intramuscular PRN Lenard Lance, FNP       magnesium hydroxide (MILK OF MAGNESIA) suspension 30 mL  30 mL Oral Daily PRN Lenard Lance, FNP       nicotine (NICODERM CQ - dosed in mg/24 hours) patch 14 mg  14 mg Transdermal Daily Lenard Lance, FNP   14 mg at 12/01/22 2956   nicotine polacrilex (NICORETTE) gum 2 mg  2 mg Oral PRN Contina Strain, Jackquline Denmark, MD       pantoprazole (PROTONIX) EC tablet 40 mg  40 mg Oral Daily Lenard Lance, FNP   40 mg at 12/01/22 0910   QUEtiapine (SEROQUEL) tablet 200 mg  200 mg Oral QHS Lavaya Defreitas, Jackquline Denmark, MD       traZODone (DESYREL) tablet 50 mg  50 mg Oral QHS PRN Lenard Lance, FNP   50 mg at 11/29/22 2128   [START ON 12/05/2022] Vitamin D (Ergocalciferol) (DRISDOL) 1.25 MG (50000 UNIT) capsule 50,000 Units  50,000 Units Oral Q7 days Lenard Lance, FNP        Lab Results:  Results for orders placed or performed during the hospital encounter of 11/28/22 (from the past 48 hour(s))  Basic metabolic panel     Status: Abnormal   Collection Time: 11/30/22 10:55 AM  Result Value Ref Range   Sodium 131 (L) 135 - 145 mmol/L   Potassium 4.5 3.5 - 5.1 mmol/L   Chloride 97 (L) 98 - 111 mmol/L   CO2 24 22 - 32 mmol/L  Glucose, Bld 99 70 - 99 mg/dL    Comment: Glucose reference range applies only to samples taken after fasting for at least 8 hours.   BUN 14 8 - 23 mg/dL   Creatinine, Ser 1.61 0.44 - 1.00 mg/dL   Calcium 8.7 (L) 8.9 - 10.3 mg/dL   GFR, Estimated >09 >60 mL/min    Comment: (NOTE) Calculated using the CKD-EPI Creatinine Equation (2021)    Anion gap 10 5 - 15    Comment: Performed at [facility], [address] [place], [place] [address]  Vitamin B12     Status: None   Collection Time: 11/30/22 10:55 AM  Result Value Ref Range   Vitamin B-12 216 180 - 914 pg/mL    Comment: (NOTE) This assay is not validated for testing neonatal or myeloproliferative syndrome specimens for Vitamin B12 levels. Performed at [facility], [address],  [place], [place] [address]   TSH     Status: None   Collection Time: 11/30/22 10:55 AM  Result Value Ref Range   TSH 4.423 0.350 - 4.500 uIU/mL    Comment: Performed by a 3rd Generation assay with a functional sensitivity of <=0.01 uIU/mL. Performed at [facility], [address] [place], [place] [address]   Basic metabolic panel     Status: Abnormal   Collection Time: 11/30/22 12:33 PM  Result Value Ref Range   Sodium 131 (L) 135 - 145 mmol/L   Potassium 4.6 3.5 - 5.1 mmol/L   Chloride 96 (L) 98 - 111 mmol/L   CO2 25 22 - 32 mmol/L   Glucose, Bld 127 (H) 70 - 99 mg/dL    Comment: Glucose reference range applies only to samples taken after fasting for at least 8 hours.   BUN 14 8 - 23 mg/dL   Creatinine, Ser 5.62 0.44 - 1.00 mg/dL   Calcium 9.1 8.9 - 13.0 mg/dL   GFR, Estimated >86 >57 mL/min    Comment: (NOTE) Calculated using the CKD-EPI Creatinine Equation (2021)    Anion gap 10 5 - 15    Comment: Performed at [facility], [address] [place], [place] [address]    Blood Alcohol level:  Lab Results  Component Value Date   [facility] <10 11/27/2022   ETH <10 07/26/2022    Metabolic Disorder Labs: Lab Results  Component Value Date   HGBA1C 4.9 11/27/2022   MPG 93.93 11/27/2022   MPG 102.54 09/17/2020   No results found for: "PROLACTIN" Lab Results  Component Value Date   CHOL 132 11/27/2022   TRIG 76 11/27/2022   HDL 79 11/27/2022   CHOLHDL 1.7 11/27/2022   VLDL 15 11/27/2022   LDLCALC 38 11/27/2022   LDLCALC 42 09/17/2020    Physical Findings: AIMS: Facial and Oral Movements Muscles of Facial Expression: None, normal Lips and Perioral Area: None, normal Jaw: None, normal Tongue: None, normal,Extremity Movements Upper (arms, wrists, hands, fingers): None, normal Lower (legs, knees, ankles, toes): None, normal, Trunk Movements Neck, shoulders, hips: None, normal, Overall Severity Severity of abnormal movements (highest score from questions  above): None, normal Incapacitation due to abnormal movements: None, normal Patient's awareness of abnormal movements (rate only patient's report): No Awareness, Dental Status Current problems with teeth and/or dentures?: No Does patient usually wear dentures?: No  CIWA:    COWS:     Musculoskeletal: Strength & Muscle Tone: within normal limits Gait & Station: normal Patient leans: N/A  Psychiatric Specialty Exam:  Presentation  General Appearance:  Appropriate for Environment; Casual  Eye Contact: Good  Speech: Clear and Coherent; Normal Rate  Speech Volume: Normal  Handedness: Right   Mood and Affect  Mood: Depressed  Affect: Depressed   Thought Process  Thought Processes: Coherent; Goal Directed; Linear  Descriptions of Associations:Intact  Orientation:Full (Time, Place and Person)  Thought Content:Logical; WDL  History of Schizophrenia/Schizoaffective disorder:No  Duration of Psychotic Symptoms:No data recorded Hallucinations:No data recorded Ideas of Reference:None  Suicidal Thoughts:No data recorded Homicidal Thoughts:No data recorded  Sensorium  Memory: Immediate Good; Recent Fair  Judgment: Fair  Insight: Present   Executive Functions  Concentration: Good  Attention Span: Good  Recall: Good  Fund of Knowledge: Good  Language: Good   Psychomotor Activity  Psychomotor Activity:No data recorded  Assets  Assets: Communication Skills; Desire for Improvement; Financial Resources/Insurance; Resilience; Social Support   Sleep  Sleep:No data recorded   Physical Exam: Physical Exam Vitals and nursing note reviewed.  Constitutional:      Appearance: Normal appearance.  HENT:     Head: Normocephalic and atraumatic.     Mouth/Throat:     Pharynx: Oropharynx is clear.  Eyes:     Pupils: Pupils are equal, round, and reactive to light.  Cardiovascular:     Rate and Rhythm: Normal rate and regular rhythm.   Pulmonary:     Effort: Pulmonary effort is normal.     Breath sounds: Normal breath sounds.  Abdominal:     General: Abdomen is flat.     Palpations: Abdomen is soft.  Musculoskeletal:        General: Normal range of motion.  Skin:    General: Skin is warm and dry.  Neurological:     General: No focal deficit present.     Mental Status: She is alert. Mental status is at baseline.  Psychiatric:        Attention and Perception: Attention normal.        Mood and Affect: Mood normal.        Speech: Speech normal.        Behavior: Behavior normal.        Thought Content: Thought content normal.        Cognition and Memory: Cognition normal.        Judgment: Judgment normal.    Review of Systems  Constitutional: Negative.   HENT: Negative.    Eyes: Negative.   Respiratory: Negative.    Cardiovascular: Negative.   Gastrointestinal: Negative.   Musculoskeletal: Negative.   Skin: Negative.   Neurological: Negative.   Psychiatric/Behavioral: Negative.     Blood pressure 128/82, pulse 79, temperature 98.1 F (36.7 C), resp. rate 16, height 5\' 4"  (1.626 m), weight 61.7 kg, SpO2 96 %. Body mass index is 23.34 kg/m.   Treatment Plan Summary: Plan patient still wishes to target Friday for discharge.  This should seem to be no problem.  No change to medicine today.  Mordecai Rasmussen, MD 12/01/2022, 5:35 PM

## 2022-12-01 NOTE — Progress Notes (Signed)
   12/01/22 2200  Psych Admission Type (Psych Patients Only)  Admission Status Voluntary  Psychosocial Assessment  Patient Complaints Anxiety;Depression  Eye Contact Fair  Facial Expression Anxious  Affect Anxious;Labile  Speech Logical/coherent  Interaction Assertive;Needy  Motor Activity Slow  Appearance/Hygiene Unremarkable  Behavior Characteristics Agitated  Mood Depressed;Anxious;Angry  Thought Process  Coherency WDL  Content Blaming others  Delusions None reported or observed  Perception WDL  Hallucination None reported or observed  Judgment Impaired  Confusion None  Danger to Self  Current suicidal ideation? Denies  Danger to Others  Danger to Others None reported or observed

## 2022-12-01 NOTE — [facility] Group Notes (Signed)
[facility] Group Notes:  (Nursing/MHT/Case Management/Adjunct)  Date:  12/01/2022  Time:  11:04 AM  Type of Therapy: Outside Rec/Discussion   Participation Level: Did not attend    Participation Quality:    Affect:    Cognitive:    Insight:    Engagement in Group:    Modes of Intervention:    Summary of Progress/Problems: Did not attend   Anyela Napierkowski T Eloisa Chokshi 12/01/2022, 11:04 AM

## 2022-12-01 NOTE — Progress Notes (Signed)
   12/01/22 0714  Psych Admission Type (Psych Patients Only)  Admission Status Voluntary  Psychosocial Assessment  Patient Complaints Anxiety;Depression  Eye Contact Fair  Facial Expression Anxious  Affect Labile;Anxious  Speech Logical/coherent  Interaction Assertive;Needy  Motor Activity Slow  Appearance/Hygiene Unremarkable  Behavior Characteristics Agitated  Mood Depressed;Anxious;Angry;Fearful;Irritable  Thought Process  Coherency WDL  Content Blaming others  Delusions None reported or observed  Perception WDL  Hallucination None reported or observed  Judgment Impaired  Confusion None  Danger to Self  Current suicidal ideation? Denies  Danger to Others  Danger to Others None reported or observed

## 2022-12-01 NOTE — Plan of Care (Addendum)
New goal as of 11/30/22  Problem: Coping Skills Goal: STG - Patient will identify 3 positive coping skills strategies to use for anger post d/c within 5 recreation therapy group sessions Description: STG - Patient will identify 3 positive coping skills strategies to use for anger post d/c within 5 recreation therapy group sessions Outcome: Not Applicable

## 2022-12-01 NOTE — BH Assessment (Signed)
Recreation Therapy Notes  INPATIENT RECREATION THERAPY ASSESSMENT  Patient Details Name: Karen Casey MRN: [id number] DOB: 1955/06/29 Today's Date: 12/01/2022       Information Obtained From: Patient (In addition to chart review)  Able to Participate in Assessment/Interview: Yes  Patient Presentation: Responsive, Alert, Oriented  Reason for Admission (Per Patient): Active Symptoms ("Greif and anger")  Patient Stressors: Family, Death, Other (Comment) ("My family fights all the time and my husband passed away recently. My lease is up on my apartment and I am living with my daughter. There is a lot going on")  Coping Skills:    ("I don't have any")  Leisure Interests (2+):  Exercise - Walking, [employer], Individual - Phone  Frequency of Recreation/Participation: Weekly  Awareness of Community Resources:  Yes  Community Resources:  Restaurants ("gas station and the grocery store")  Current Use: Yes  If no, Barriers?:    Expressed Interest in [employer] Information: No  [employer] of Residence:     Patient Main Form of Transportation: Other (Comment) ("my sister was driving my but last time she drove she got confused and it was scary. I don't want her to have to drive more than she has to. I'll take the bus")  Patient Strengths:  "I am good at taking care of people. That is all I did all my life and I am just not doing that anymore."  Patient Identified Areas of Improvement:  "I want to be more independent"  Patient Goal for Hospitalization:  "To get the hell out of it"  Current SI (including self-harm):  No  Current HI:  No  Current AVH: No  Staff Intervention Plan: Group Attendance, Collaborate with Interdisciplinary Treatment Team  Consent to Intern Participation: N/A  Patient shared that she is just angry with the world. Pt expressed the feeling that she did not need to be at the hospital and was quick to point out  things she did not like about the others on the unit, staff and patients both. Pt shared, "I just am not myself anymore." LRT asked who she was and pt replied: "Mean ass [name]". Pt shared that her living situation with her family is "toxic" and that there is a lot going on. 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 Pt shared that she use to partake in different leisure activities such as gardening, walking and jogging. However, shared that she does not do these anymore and just "lays around and play on my phone." Pt affect was full range and dramatized at some points. Pt was pleasant with LRT during assessment.    Laconda Basich LRT, CTRS Gary Bultman E Virlan Kempker 12/01/2022, 8:22 AM

## 2022-12-01 NOTE — Progress Notes (Signed)
   12/01/22 0000  Psych Admission Type (Psych Patients Only)  Admission Status Voluntary  Psychosocial Assessment  Patient Complaints Anxiety;Depression  Eye Contact Fair  Facial Expression Anxious  Affect Anxious;Labile  Speech Logical/coherent  Interaction Assertive  Motor Activity Slow  Appearance/Hygiene Unremarkable  Behavior Characteristics Cooperative;Irritable  Mood Irritable  Thought Process  Coherency WDL  Content Blaming others  Delusions None reported or observed  Perception WDL  Hallucination None reported or observed  Judgment Impaired  Confusion None  Danger to Self  Current suicidal ideation? Denies  Danger to Others  Danger to Others None reported or observed

## 2022-12-01 NOTE — Plan of Care (Signed)

## 2022-12-02 ENCOUNTER — Other Ambulatory Visit: Payer: Self-pay | Admitting: Family Medicine

## 2022-12-02 DIAGNOSIS — F419 Anxiety disorder, unspecified: Secondary | ICD-10-CM

## 2022-12-02 DIAGNOSIS — F332 Major depressive disorder, recurrent severe without psychotic features: Secondary | ICD-10-CM | POA: Diagnosis not present

## 2022-12-02 MED ORDER — ESCITALOPRAM OXALATE 10 MG PO TABS
15.0000 mg | ORAL_TABLET | Freq: Every day | ORAL | Status: DC
  Administered 2022-12-03 – 2022-12-06 (×4): 15 mg via ORAL
  Filled 2022-12-02 (×4): qty 2

## 2022-12-02 NOTE — Group Note (Signed)
Recreation Therapy Group Note   Group Topic:Stress Management  Group Date: 12/02/2022 Start Time: 1400 End Time: 1500 Facilitators: Rosina Lowenstein, LRT, CTRS Location:  Dayroom  Group description: Minute To Win It. LRT and pts played 3 minute to win it games. The first one being that they had to list out loud as many presidents as they could, "name that tune" where they listened to a small clip of a song and had to guess the name of it, and lastly cup stack. For cup stack, each pt is given 6 foam cups. They are instructed to make 1 pyramid while using all 6 cups using both hands. Second round, they only use their right hand to make a pyramid and the third round, they only use their left hand. LRT and pts discussed the physical and mental symptoms of being under stress or under pressure. LRT and pts discussed how this can be used post discharge.   Goal Area(s) Addressed: Patient will identify physical symptoms of stress. Patient will identify emotional symptoms of stress. Patient will build frustration tolerance skills.   Affect/Mood: Appropriate   Participation Level: Active and Engaged   Participation Quality: Independent   Behavior: Appropriate and Cooperative   Speech/Thought Process: Coherent   Insight: Good   Judgement: Good   Modes of Intervention: Activity   Patient Response to Interventions:  Attentive, Engaged, Interested , and Receptive   Education Outcome:  Acknowledges education   Clinical Observations/Individualized Feedback: Karen Casey was active in their participation of session activities and group discussion. Pt identified "my heart starts to beat really fast whenever I am stressed out". Pt was observed helping peer with the sup stack game. Pt interacted well with LRT and peers duration of session.   Plan: Continue to engage patient in RT group sessions 2-3x/week.   Rosina Lowenstein, LRT, CTRS 12/02/2022 4:15 PM

## 2022-12-02 NOTE — Progress Notes (Signed)
   12/02/22 2300  Psych Admission Type (Psych Patients Only)  Admission Status Voluntary  Psychosocial Assessment  Patient Complaints Anxiety;Depression;Other (Comment);Anger (grief and anger from the death of her husband)  Eye Contact Fair  Facial Expression Anxious  Affect Anxious  Speech Logical/coherent  Interaction Assertive  Motor Activity Slow  Appearance/Hygiene Unremarkable  Behavior Characteristics Appropriate to situation  Mood Anxious;Depressed;Sad;Pleasant  Thought Process  Coherency WDL  Content WDL  Delusions None reported or observed  Perception WDL  Hallucination None reported or observed  Judgment Impaired  Confusion None  Danger to Self  Current suicidal ideation? Denies  Agreement Not to Harm Self Yes  Description of Agreement verbal  Danger to Others  Danger to Others None reported or observed

## 2022-12-02 NOTE — Group Note (Unsigned)
Date:  12/02/2022 Time:  2:50 PM  Group Topic/Focus:  movement     Participation Level:  {B[facility] PARTICIPATION LEVEL:22264}  Participation Quality:  {B[facility] PARTICIPATION QUALITY:22265}  Affect:  {B[facility] AFFECT:22266}  Cognitive:  {B[facility] COGNITIVE:22267}  Insight: {BHH Insight2:20797}  Engagement in Group:  {B[facility] ENGAGEMENT IN GROUP:22268}  Modes of Intervention:  {BHH MODES OF INTERVENTION:22269}  Additional Comments:  ***  Karen Casey P Beulah Capobianco 12/02/2022, 2:50 PM  

## 2022-12-02 NOTE — Telephone Encounter (Signed)
Called sister back. Thanked her for letting me know about ms Handshoe. Notes she is currently sleeping on her sisters couch. Notes the plan is for discharge tomorrow. She has a room to stay in at her sisters house. Discusses that she needs a Veterinary surgeon. Applying for section 8 housing. On the wait for a senior living place [facility]. Otherwise she has nowhere to live, they are working on putting her in boardinghouse. Provided supportive listening. Discussed [email or web address] to set up with counseling. Discussed I will follow up after discharge and if I can do anything else in the meantime to let me know.   Burley Saver MD

## 2022-12-02 NOTE — Progress Notes (Signed)
Patient is a Physicist, medical for recurrent depression. She is calm and cooperative, takes her medications whole. She participates in activities and interacts well with her peers. Medicated prn with tylenol for her chronic back pain which she states is always between 6-8 even with tylenol and a lidocaine patch. Denies SI/HI/AVH. Q80min safety checks in place.Will continue to monitor.

## 2022-12-02 NOTE — [facility] Group Notes (Signed)
[facility] Group Notes:  (Nursing/MHT/Case Management/Adjunct)  Date:  12/02/2022  Time:  10:09 AM  Type of Therapy:  Movement Therapy  Participation Level:  Did Not Attend    Rodena Goldmann 12/02/2022, 10:09 AM

## 2022-12-02 NOTE — Progress Notes (Signed)
[facility] MD Progress Note  12/02/2022 3:34 PM Karen Casey  MRN:  [id number] Subjective: Follow-up for this 67 year old woman with severe recurrent depression.  Patient more grumpy today.  Says she was having an anxiety attack earlier today.  He was a lot less secure about the idea of discharge this week.  Asking for increases in medicine. Principal Problem: Severe recurrent major depression without psychotic features (HCC) Diagnosis: Principal Problem:   Severe recurrent major depression without psychotic features (HCC) Active Problems:   GERD (gastroesophageal reflux disease)   COPD (chronic obstructive pulmonary disease) (HCC)   Anemia  Total Time spent with patient: 30 minutes  Past Psychiatric History: Past history of recurrent depression and anxiety  Past Medical History:  Past Medical History:  Diagnosis Date   Alcohol abuse    last use 03/09/21, marijuana last 03/09/21   Allergy    Anxiety    Cataract 06/09/2012   Right eye and left eye   COPD (chronic obstructive pulmonary disease) (HCC)    Depression    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/04/2020   Due to benzodiazepine withdrawal   Dyspnea    uses oxygen 2L via [place] prn   Enterococcal bacteremia 02/10/2022   GERD (gastroesophageal reflux disease)    Headache    Hypertension    Long term (current) use of anticoagulants    Nausea vomiting and diarrhea 02/09/2022   Neuromuscular disorder (HCC)    Neuropathy    Positive blood culture 02/10/2022   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   Seizure (HCC)    08/2020 per patient   Seizures (HCC)    xanax withdrawl- December 2013   Urinary incontinence 06/09/2012    Past Surgical History:  Procedure Laterality Date   APPENDECTOMY     BIOPSY  01/04/2021   Procedure: BIOPSY;  Surgeon: Benancio Deeds, MD;  Location: [facility] ENDOSCOPY;  Service: Gastroenterology;;   BIOPSY  03/12/2021   Procedure: BIOPSY;  Surgeon: Lemar Lofty.,  MD;  Location: [facility] ENDOSCOPY;  Service: Gastroenterology;;   CARDIOVERSION N/A 01/28/2022   Procedure: CARDIOVERSION;  Surgeon: Yates Decamp, MD;  Location: [facility] ENDOSCOPY;  Service: Cardiovascular;  Laterality: N/A;   CATARACT EXTRACTION  06/09/2012   Left eye   COLONOSCOPY WITH PROPOFOL N/A 03/12/2021   Procedure: COLONOSCOPY WITH PROPOFOL;  Surgeon: Lemar Lofty., MD;  Location: [facility] ENDOSCOPY;  Service: Gastroenterology;  Laterality: N/A;   ESOPHAGOGASTRODUODENOSCOPY (EGD) WITH PROPOFOL N/A 01/04/2021   Procedure: ESOPHAGOGASTRODUODENOSCOPY (EGD) WITH PROPOFOL;  Surgeon: Benancio Deeds, MD;  Location: [facility] ENDOSCOPY;  Service: Gastroenterology;  Laterality: N/A;   left shoulder dislocation  Sept 2011   POLYPECTOMY  03/12/2021   Procedure: POLYPECTOMY;  Surgeon: Mansouraty, Netty Starring., MD;  Location: [facility] ENDOSCOPY;  Service: Gastroenterology;;   Family History:  Family History  Problem Relation Age of Onset   Hypertension Mother    Hyperlipidemia Mother    Aneurysm Mother        Rupture - Cause of death   Heart disease Father        MI - cause of death   Depression Father    Parkinsonism Father    Hypertension Sister    Colon cancer Neg Hx    Esophageal cancer Neg Hx    Rectal cancer Neg Hx    Stomach cancer Neg Hx    Family Psychiatric  History: See previous Social History:  Social History   Substance and Sexual Activity  Alcohol Use Yes  Alcohol/week: 3.0 standard drinks of alcohol   Types: 3 Cans of beer per week   Comment: 1-2 times week just beer     Social History   Substance and Sexual Activity  Drug Use Yes   Types: Marijuana, Other-see comments   Comment: Past hx of benzo abuse--xanax    Social History   Socioeconomic History   Marital status: Single    Spouse name: Not on file   Number of children: 1   Years of education: Not on file   Highest education level: Not on file  Occupational History   Not on file  Tobacco Use   Smoking status:  Every Day    Packs/day: 0.50    Years: 43.00    Additional pack years: 0.00    Total pack years: 21.50    Types: Cigarettes   Smokeless tobacco: Never   Tobacco comments:    Tobacco info given  Vaping Use   Vaping Use: Never used  Substance and Sexual Activity   Alcohol use: Yes    Alcohol/week: 3.0 standard drinks of alcohol    Types: 3 Cans of beer per week    Comment: 1-2 times week just beer   Drug use: Yes    Types: Marijuana, Other-see comments    Comment: Past hx of benzo abuse--xanax   Sexual activity: Not on file  Other Topics Concern   Not on file  Social History Narrative   Not on file   Social Determinants of Health   Financial Resource Strain: Medium Risk (10/23/2022)   Overall Financial Resource Strain (CARDIA)    Difficulty of Paying Living Expenses: Somewhat hard  Food Insecurity: No Food Insecurity (11/28/2022)   Hunger Vital Sign    Worried About Running Out of Food in the Last Year: Never true    Ran Out of Food in the Last Year: Never true  Transportation Needs: Unmet Transportation Needs (11/28/2022)   PRAPARE - Transportation    Lack of Transportation (Medical): Yes    Lack of Transportation (Non-Medical): Yes  Physical Activity: Inactive (10/23/2022)   Exercise Vital Sign    Days of Exercise per Week: 0 days    Minutes of Exercise per Session: 0 min  Stress: Stress Concern Present (10/23/2022)   [employer] of Occupational Health - Occupational Stress Questionnaire    Feeling of Stress : Very much  Social Connections: Socially Isolated (10/23/2022)   Social Connection and Isolation Panel [NHANES]    Frequency of Communication with Friends and Family: Three times a week    Frequency of Social Gatherings with Friends and Family: Once a week    Attends Religious Services: Never    Database administrator or Organizations: No    Attends Banker Meetings: Never    Marital Status: Widowed   Additional Social History:                          Sleep: Fair  Appetite:  Fair  Current Medications: Current Facility-Administered Medications  Medication Dose Route Frequency Provider Last Rate Last Admin   acetaminophen (TYLENOL) tablet 650 mg  650 mg Oral Q6H PRN Lenard Lance, FNP   650 mg at 12/02/22 0834   albuterol (PROVENTIL) (2.5 MG/3ML) 0.083% nebulizer solution 3 mL  3 mL Inhalation Q6H PRN Lenard Lance, FNP       alum & mag hydroxide-simeth (MAALOX/MYLANTA) 200-200-20 MG/5ML suspension 30 mL  30 mL Oral Q4H PRN  Lenard Lance, FNP       amiodarone (PACERONE) tablet 200 mg  200 mg Oral Daily Lenard Lance, FNP   200 mg at 12/02/22 1014   amitriptyline (ELAVIL) tablet 10 mg  10 mg Oral QHS Terence Googe, Jackquline Denmark, MD   10 mg at 12/01/22 2127   apixaban (ELIQUIS) tablet 5 mg  5 mg Oral BID Lenard Lance, FNP   5 mg at 12/02/22 1011   clonazePAM (KLONOPIN) tablet 0.5 mg  0.5 mg Oral BID Biff Rutigliano T, MD   0.5 mg at 12/02/22 1012   [START ON 12/03/2022] escitalopram (LEXAPRO) tablet 15 mg  15 mg Oral Daily Israel Werts T, MD       ferrous sulfate tablet 325 mg  325 mg Oral Otelia Santee, FNP   325 mg at 12/02/22 1012   fluticasone furoate-vilanterol (BREO ELLIPTA) 100-25 MCG/ACT 1 puff  1 puff Inhalation Daily Lenard Lance, FNP   1 puff at 12/02/22 1610   And   umeclidinium bromide (INCRUSE ELLIPTA) 62.5 MCG/ACT 1 puff  1 puff Inhalation Daily Lenard Lance, FNP   1 puff at 12/02/22 9604   gabapentin (NEURONTIN) capsule 800 mg  800 mg Oral TID Lenard Lance, FNP   800 mg at 12/02/22 1011   hydrOXYzine (ATARAX) tablet 25 mg  25 mg Oral TID PRN Lenard Lance, FNP   25 mg at 12/02/22 1413   ibuprofen (ADVIL) tablet 400 mg  400 mg Oral Q6H PRN Korbyn Vanes, Jackquline Denmark, MD   400 mg at 12/02/22 1412   lidocaine (LIDODERM) 5 % 1 patch  1 patch Transdermal Q24H Arrie Borrelli, Jackquline Denmark, MD   1 patch at 12/02/22 1010   OLANZapine zydis (ZYPREXA) disintegrating tablet 5 mg  5 mg Oral Q8H PRN Lenard Lance, FNP       And   LORazepam (ATIVAN)  tablet 1 mg  1 mg Oral PRN Lenard Lance, FNP       And   ziprasidone (GEODON) injection 20 mg  20 mg Intramuscular PRN Lenard Lance, FNP       magnesium hydroxide (MILK OF MAGNESIA) suspension 30 mL  30 mL Oral Daily PRN Lenard Lance, FNP       nicotine (NICODERM CQ - dosed in mg/24 hours) patch 14 mg  14 mg Transdermal Daily Lenard Lance, FNP   14 mg at 12/02/22 1008   nicotine polacrilex (NICORETTE) gum 2 mg  2 mg Oral PRN Alleta Avery, Jackquline Denmark, MD       pantoprazole (PROTONIX) EC tablet 40 mg  40 mg Oral Daily Lenard Lance, FNP   40 mg at 12/02/22 1010   QUEtiapine (SEROQUEL) tablet 200 mg  200 mg Oral QHS Kastiel Simonian T, MD   200 mg at 12/01/22 2127   traZODone (DESYREL) tablet 50 mg  50 mg Oral QHS PRN Lenard Lance, FNP   50 mg at 11/29/22 2128   [START ON 12/05/2022] Vitamin D (Ergocalciferol) (DRISDOL) 1.25 MG (50000 UNIT) capsule 50,000 Units  50,000 Units Oral Q7 days Lenard Lance, FNP        Lab Results: No results found for this or any previous visit (from the past 48 hour(s)).  Blood Alcohol level:  Lab Results  Component Value Date   [facility] <10 11/27/2022   ETH <10 07/26/2022    Metabolic Disorder Labs: Lab Results  Component Value Date   HGBA1C 4.9 11/27/2022  MPG 93.93 11/27/2022   MPG 102.54 09/17/2020   No results found for: "PROLACTIN" Lab Results  Component Value Date   CHOL 132 11/27/2022   TRIG 76 11/27/2022   HDL 79 11/27/2022   CHOLHDL 1.7 11/27/2022   VLDL 15 11/27/2022   LDLCALC 38 11/27/2022   LDLCALC 42 09/17/2020    Physical Findings: AIMS: Facial and Oral Movements Muscles of Facial Expression: None, normal Lips and Perioral Area: None, normal Jaw: None, normal Tongue: None, normal,Extremity Movements Upper (arms, wrists, hands, fingers): None, normal Lower (legs, knees, ankles, toes): None, normal, Trunk Movements Neck, shoulders, hips: None, normal, Overall Severity Severity of abnormal movements (highest score from questions above):  None, normal Incapacitation due to abnormal movements: None, normal Patient's awareness of abnormal movements (rate only patient's report): No Awareness, Dental Status Current problems with teeth and/or dentures?: No Does patient usually wear dentures?: No  CIWA:    COWS:     Musculoskeletal: Strength & Muscle Tone: within normal limits Gait & Station: normal Patient leans: N/A  Psychiatric Specialty Exam:  Presentation  General Appearance:  Appropriate for Environment; Casual  Eye Contact: Good  Speech: Clear and Coherent; Normal Rate  Speech Volume: Normal  Handedness: Right   Mood and Affect  Mood: Depressed  Affect: Depressed   Thought Process  Thought Processes: Coherent; Goal Directed; Linear  Descriptions of Associations:Intact  Orientation:Full (Time, Place and Person)  Thought Content:Logical; WDL  History of Schizophrenia/Schizoaffective disorder:No  Duration of Psychotic Symptoms:No data recorded Hallucinations:No data recorded Ideas of Reference:None  Suicidal Thoughts:No data recorded Homicidal Thoughts:No data recorded  Sensorium  Memory: Immediate Good; Recent Fair  Judgment: Fair  Insight: Present   Executive Functions  Concentration: Good  Attention Span: Good  Recall: Good  Fund of Knowledge: Good  Language: Good   Psychomotor Activity  Psychomotor Activity:No data recorded  Assets  Assets: Communication Skills; Desire for Improvement; Financial Resources/Insurance; Resilience; Social Support   Sleep  Sleep:No data recorded   Physical Exam: Physical Exam Vitals and nursing note reviewed.  Constitutional:      Appearance: Normal appearance.  HENT:     Head: Normocephalic and atraumatic.     Mouth/Throat:     Pharynx: Oropharynx is clear.  Eyes:     Pupils: Pupils are equal, round, and reactive to light.  Cardiovascular:     Rate and Rhythm: Normal rate and regular rhythm.  Pulmonary:      Effort: Pulmonary effort is normal.     Breath sounds: Normal breath sounds.  Abdominal:     General: Abdomen is flat.     Palpations: Abdomen is soft.  Musculoskeletal:        General: Normal range of motion.  Skin:    General: Skin is warm and dry.  Neurological:     General: No focal deficit present.     Mental Status: She is alert. Mental status is at baseline.  Psychiatric:        Attention and Perception: Attention normal.        Mood and Affect: Affect is blunt.        Speech: Speech is tangential.        Behavior: Behavior is cooperative.        Thought Content: Thought content normal.    Review of Systems  Constitutional: Negative.   HENT: Negative.    Eyes: Negative.   Respiratory: Negative.    Cardiovascular: Negative.   Gastrointestinal: Negative.   Musculoskeletal:  Positive for myalgias.  Skin: Negative.   Neurological: Negative.   Psychiatric/Behavioral:  The patient is nervous/anxious.    Blood pressure 122/62, pulse 74, temperature (!) 97.4 F (36.3 C), resp. rate 16, height 5\' 4"  (1.626 m), weight 61.7 kg, SpO2 98 %. Body mass index is 23.34 kg/m.   Treatment Plan Summary: Plan no change to the Klonopin which is what she was requesting but did increase Lexapro to 15 mg a day.  Encourage patient to keep thinking about it but we will reassess for possible discharge tomorrow.  Mordecai Rasmussen, MD 12/02/2022, 3:34 PM

## 2022-12-03 ENCOUNTER — Ambulatory Visit: Payer: Medicare HMO | Admitting: Family Medicine

## 2022-12-03 DIAGNOSIS — F332 Major depressive disorder, recurrent severe without psychotic features: Secondary | ICD-10-CM | POA: Diagnosis not present

## 2022-12-03 MED ORDER — CLONAZEPAM 0.5 MG PO TABS
0.5000 mg | ORAL_TABLET | Freq: Three times a day (TID) | ORAL | Status: DC
  Administered 2022-12-03 – 2022-12-06 (×9): 0.5 mg via ORAL
  Filled 2022-12-03 (×9): qty 1

## 2022-12-03 NOTE — Progress Notes (Signed)
[facility] MD Progress Note  12/03/2022 4:42 PM Karen Casey  MRN:  [id number] Subjective: Patient seen and chart reviewed.  Patient Grumpy as usual but not suicidal and no sign of acute dangerousness.  Still complains of worsening anxiety and asks to have the Klonopin increased Principal Problem: Severe recurrent major depression without psychotic features (HCC) Diagnosis: Principal Problem:   Severe recurrent major depression without psychotic features (HCC) Active Problems:   GERD (gastroesophageal reflux disease)   COPD (chronic obstructive pulmonary disease) (HCC)   Anemia  Total Time spent with patient: 30 minutes  Past Psychiatric History: Past history of anxiety  Past Medical History:  Past Medical History:  Diagnosis Date   Alcohol abuse    last use 03/09/21, marijuana last 03/09/21   Allergy    Anxiety    Cataract 06/09/2012   Right eye and left eye   COPD (chronic obstructive pulmonary disease) (HCC)    Depression    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/04/2020   Due to benzodiazepine withdrawal   Dyspnea    uses oxygen 2L via [place] prn   Enterococcal bacteremia 02/10/2022   GERD (gastroesophageal reflux disease)    Headache    Hypertension    Long term (current) use of anticoagulants    Nausea vomiting and diarrhea 02/09/2022   Neuromuscular disorder (HCC)    Neuropathy    Positive blood culture 02/10/2022   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   Seizure (HCC)    08/2020 per patient   Seizures (HCC)    xanax withdrawl- December 2013   Urinary incontinence 06/09/2012    Past Surgical History:  Procedure Laterality Date   APPENDECTOMY     BIOPSY  01/04/2021   Procedure: BIOPSY;  Surgeon: Benancio Deeds, MD;  Location: [facility] ENDOSCOPY;  Service: Gastroenterology;;   BIOPSY  03/12/2021   Procedure: BIOPSY;  Surgeon: Lemar Lofty., MD;  Location: [facility] ENDOSCOPY;  Service: Gastroenterology;;   CARDIOVERSION N/A 01/28/2022    Procedure: CARDIOVERSION;  Surgeon: Yates Decamp, MD;  Location: [facility] ENDOSCOPY;  Service: Cardiovascular;  Laterality: N/A;   CATARACT EXTRACTION  06/09/2012   Left eye   COLONOSCOPY WITH PROPOFOL N/A 03/12/2021   Procedure: COLONOSCOPY WITH PROPOFOL;  Surgeon: Lemar Lofty., MD;  Location: [facility] ENDOSCOPY;  Service: Gastroenterology;  Laterality: N/A;   ESOPHAGOGASTRODUODENOSCOPY (EGD) WITH PROPOFOL N/A 01/04/2021   Procedure: ESOPHAGOGASTRODUODENOSCOPY (EGD) WITH PROPOFOL;  Surgeon: Benancio Deeds, MD;  Location: [facility] ENDOSCOPY;  Service: Gastroenterology;  Laterality: N/A;   left shoulder dislocation  Sept 2011   POLYPECTOMY  03/12/2021   Procedure: POLYPECTOMY;  Surgeon: Mansouraty, Netty Starring., MD;  Location: [facility] ENDOSCOPY;  Service: Gastroenterology;;   Family History:  Family History  Problem Relation Age of Onset   Hypertension Mother    Hyperlipidemia Mother    Aneurysm Mother        Rupture - Cause of death   Heart disease Father        MI - cause of death   Depression Father    Parkinsonism Father    Hypertension Sister    Colon cancer Neg Hx    Esophageal cancer Neg Hx    Rectal cancer Neg Hx    Stomach cancer Neg Hx    Family Psychiatric  History: See previous Social History:  Social History   Substance and Sexual Activity  Alcohol Use Yes   Alcohol/week: 3.0 standard drinks of alcohol   Types: 3 Cans of beer  per week   Comment: 1-2 times week just beer     Social History   Substance and Sexual Activity  Drug Use Yes   Types: Marijuana, Other-see comments   Comment: Past hx of benzo abuse--xanax    Social History   Socioeconomic History   Marital status: Single    Spouse name: Not on file   Number of children: 1   Years of education: Not on file   Highest education level: Not on file  Occupational History   Not on file  Tobacco Use   Smoking status: Every Day    Packs/day: 0.50    Years: 43.00    Additional pack years: 0.00    Total pack  years: 21.50    Types: Cigarettes   Smokeless tobacco: Never   Tobacco comments:    Tobacco info given  Vaping Use   Vaping Use: Never used  Substance and Sexual Activity   Alcohol use: Yes    Alcohol/week: 3.0 standard drinks of alcohol    Types: 3 Cans of beer per week    Comment: 1-2 times week just beer   Drug use: Yes    Types: Marijuana, Other-see comments    Comment: Past hx of benzo abuse--xanax   Sexual activity: Not on file  Other Topics Concern   Not on file  Social History Narrative   Not on file   Social Determinants of Health   Financial Resource Strain: Medium Risk (10/23/2022)   Overall Financial Resource Strain (CARDIA)    Difficulty of Paying Living Expenses: Somewhat hard  Food Insecurity: No Food Insecurity (11/28/2022)   Hunger Vital Sign    Worried About Running Out of Food in the Last Year: Never true    Ran Out of Food in the Last Year: Never true  Transportation Needs: Unmet Transportation Needs (11/28/2022)   PRAPARE - Transportation    Lack of Transportation (Medical): Yes    Lack of Transportation (Non-Medical): Yes  Physical Activity: Inactive (10/23/2022)   Exercise Vital Sign    Days of Exercise per Week: 0 days    Minutes of Exercise per Session: 0 min  Stress: Stress Concern Present (10/23/2022)   [employer] of Occupational Health - Occupational Stress Questionnaire    Feeling of Stress : Very much  Social Connections: Socially Isolated (10/23/2022)   Social Connection and Isolation Panel [NHANES]    Frequency of Communication with Friends and Family: Three times a week    Frequency of Social Gatherings with Friends and Family: Once a week    Attends Religious Services: Never    Database administrator or Organizations: No    Attends Banker Meetings: Never    Marital Status: Widowed   Additional Social History:                         Sleep: Fair  Appetite:  Fair  Current Medications: Current  Facility-Administered Medications  Medication Dose Route Frequency Provider Last Rate Last Admin   acetaminophen (TYLENOL) tablet 650 mg  650 mg Oral Q6H PRN Lenard Lance, FNP   650 mg at 12/03/22 1600   albuterol (PROVENTIL) (2.5 MG/3ML) 0.083% nebulizer solution 3 mL  3 mL Inhalation Q6H PRN Lenard Lance, FNP       alum & mag hydroxide-simeth (MAALOX/MYLANTA) 200-200-20 MG/5ML suspension 30 mL  30 mL Oral Q4H PRN Lenard Lance, FNP       amiodarone (PACERONE) tablet  200 mg  200 mg Oral Daily Lenard Lance, FNP   200 mg at 12/03/22 0930   amitriptyline (ELAVIL) tablet 10 mg  10 mg Oral QHS Zymier Rodgers, Jackquline Denmark, MD   10 mg at 12/02/22 2206   apixaban (ELIQUIS) tablet 5 mg  5 mg Oral BID Lenard Lance, FNP   5 mg at 12/03/22 0930   clonazePAM (KLONOPIN) tablet 0.5 mg  0.5 mg Oral TID Deannah Rossi T, MD   0.5 mg at 12/03/22 1600   escitalopram (LEXAPRO) tablet 15 mg  15 mg Oral Daily Jacarri Gesner T, MD   15 mg at 12/03/22 0930   ferrous sulfate tablet 325 mg  325 mg Oral Otelia Santee, FNP   325 mg at 12/02/22 1012   fluticasone furoate-vilanterol (BREO ELLIPTA) 100-25 MCG/ACT 1 puff  1 puff Inhalation Daily Lenard Lance, FNP   1 puff at 12/03/22 0933   And   umeclidinium bromide (INCRUSE ELLIPTA) 62.5 MCG/ACT 1 puff  1 puff Inhalation Daily Lenard Lance, FNP   1 puff at 12/03/22 0933   gabapentin (NEURONTIN) capsule 800 mg  800 mg Oral TID Lenard Lance, FNP   800 mg at 12/03/22 1600   hydrOXYzine (ATARAX) tablet 25 mg  25 mg Oral TID PRN Lenard Lance, FNP   25 mg at 12/03/22 1600   ibuprofen (ADVIL) tablet 400 mg  400 mg Oral Q6H PRN Saryn Cherry, Jackquline Denmark, MD   400 mg at 12/02/22 2206   lidocaine (LIDODERM) 5 % 1 patch  1 patch Transdermal Q24H Sharman Garrott, Jackquline Denmark, MD   1 patch at 12/03/22 0930   OLANZapine zydis (ZYPREXA) disintegrating tablet 5 mg  5 mg Oral Q8H PRN Lenard Lance, FNP       And   LORazepam (ATIVAN) tablet 1 mg  1 mg Oral PRN Lenard Lance, FNP       And   ziprasidone (GEODON)  injection 20 mg  20 mg Intramuscular PRN Lenard Lance, FNP       magnesium hydroxide (MILK OF MAGNESIA) suspension 30 mL  30 mL Oral Daily PRN Lenard Lance, FNP       nicotine (NICODERM CQ - dosed in mg/24 hours) patch 14 mg  14 mg Transdermal Daily Lenard Lance, FNP   14 mg at 12/03/22 1610   nicotine polacrilex (NICORETTE) gum 2 mg  2 mg Oral PRN Vanessa Kampf, Jackquline Denmark, MD       pantoprazole (PROTONIX) EC tablet 40 mg  40 mg Oral Daily Lenard Lance, FNP   40 mg at 12/03/22 0930   QUEtiapine (SEROQUEL) tablet 200 mg  200 mg Oral QHS Gleen Ripberger T, MD   200 mg at 12/01/22 2127   traZODone (DESYREL) tablet 50 mg  50 mg Oral QHS PRN Lenard Lance, FNP   50 mg at 11/29/22 2128   [START ON 12/05/2022] Vitamin D (Ergocalciferol) (DRISDOL) 1.25 MG (50000 UNIT) capsule 50,000 Units  50,000 Units Oral Q7 days Lenard Lance, FNP        Lab Results: No results found for this or any previous visit (from the past 48 hour(s)).  Blood Alcohol level:  Lab Results  Component Value Date   [facility] <10 11/27/2022   ETH <10 07/26/2022    Metabolic Disorder Labs: Lab Results  Component Value Date   HGBA1C 4.9 11/27/2022   MPG 93.93 11/27/2022   MPG 102.54 09/17/2020   No results found  for: "PROLACTIN" Lab Results  Component Value Date   CHOL 132 11/27/2022   TRIG 76 11/27/2022   HDL 79 11/27/2022   CHOLHDL 1.7 11/27/2022   VLDL 15 11/27/2022   LDLCALC 38 11/27/2022   LDLCALC 42 09/17/2020    Physical Findings: AIMS: Facial and Oral Movements Muscles of Facial Expression: None, normal Lips and Perioral Area: None, normal Jaw: None, normal Tongue: None, normal,Extremity Movements Upper (arms, wrists, hands, fingers): None, normal Lower (legs, knees, ankles, toes): None, normal, Trunk Movements Neck, shoulders, hips: None, normal, Overall Severity Severity of abnormal movements (highest score from questions above): None, normal Incapacitation due to abnormal movements: None, normal Patient's  awareness of abnormal movements (rate only patient's report): No Awareness, Dental Status Current problems with teeth and/or dentures?: No Does patient usually wear dentures?: No  CIWA:    COWS:     Musculoskeletal: Strength & Muscle Tone: within normal limits Gait & Station: normal Patient leans: N/A  Psychiatric Specialty Exam:  Presentation  General Appearance:  Appropriate for Environment; Casual  Eye Contact: Good  Speech: Clear and Coherent; Normal Rate  Speech Volume: Normal  Handedness: Right   Mood and Affect  Mood: Depressed  Affect: Depressed   Thought Process  Thought Processes: Coherent; Goal Directed; Linear  Descriptions of Associations:Intact  Orientation:Full (Time, Place and Person)  Thought Content:Logical; WDL  History of Schizophrenia/Schizoaffective disorder:No  Duration of Psychotic Symptoms:No data recorded Hallucinations:No data recorded Ideas of Reference:None  Suicidal Thoughts:No data recorded Homicidal Thoughts:No data recorded  Sensorium  Memory: Immediate Good; Recent Fair  Judgment: Fair  Insight: Present   Executive Functions  Concentration: Good  Attention Span: Good  Recall: Good  Fund of Knowledge: Good  Language: Good   Psychomotor Activity  Psychomotor Activity:No data recorded  Assets  Assets: Communication Skills; Desire for Improvement; Financial Resources/Insurance; Resilience; Social Support   Sleep  Sleep:No data recorded   Physical Exam: Physical Exam Vitals and nursing note reviewed.  Constitutional:      Appearance: Normal appearance.  HENT:     Head: Normocephalic and atraumatic.     Mouth/Throat:     Pharynx: Oropharynx is clear.  Eyes:     Pupils: Pupils are equal, round, and reactive to light.  Cardiovascular:     Rate and Rhythm: Normal rate and regular rhythm.  Pulmonary:     Effort: Pulmonary effort is normal.     Breath sounds: Normal breath sounds.   Abdominal:     General: Abdomen is flat.     Palpations: Abdomen is soft.  Musculoskeletal:        General: Normal range of motion.  Skin:    General: Skin is warm and dry.  Neurological:     General: No focal deficit present.     Mental Status: She is alert. Mental status is at baseline.  Psychiatric:        Attention and Perception: Attention normal.        Mood and Affect: Mood normal.        Speech: Speech normal.        Behavior: Behavior is cooperative.        Thought Content: Thought content normal.        Cognition and Memory: Cognition normal.        Judgment: Judgment normal.    Review of Systems  Constitutional: Negative.   HENT: Negative.    Eyes: Negative.   Respiratory: Negative.    Cardiovascular: Negative.   Gastrointestinal: Negative.  Musculoskeletal: Negative.   Skin: Negative.   Neurological: Negative.   Psychiatric/Behavioral: Negative.     Blood pressure (!) 156/81, pulse 73, temperature (!) 97.3 F (36.3 C), resp. rate 18, height 5\' 4"  (1.626 m), weight 61.7 kg, SpO2 96 %. Body mass index is 23.34 kg/m.   Treatment Plan Summary: Medication management and Plan no change to most medication although I am increasing the Klonopin to 3 times a day.  Encourage patient to be participatory as she has been.  She is hoping for discharge either Sunday or Monday  Mordecai Rasmussen, MD 12/03/2022, 4:42 PM

## 2022-12-03 NOTE — BHH Counselor (Signed)
Pt stated wanting to be discharged on Monday 6/24 to her sister's house. Pt wants a referral to psychiatry in [facility] for her discharge f/u appointment. Pt states she wanted to be discharged today but wouldn't be able to get a ride from her sister or pick the keys up for her new boarding house until Monday. CSW offered taxi transport for discharge today but pt declined. CSW encouraged patient to look through follow-up resources to make a decision about where she wanted her outpatient appt. CSW will follow-up with pt to confirm discharge on Monday 6/24.   Reynaldo Minium, MSW, [address]

## 2022-12-03 NOTE — Plan of Care (Signed)
  Problem: Safety: Goal: Ability to remain free from injury will improve Outcome: Progressing   Problem: Skin Integrity: Goal: Risk for impaired skin integrity will decrease Outcome: Progressing   Problem: Coping: Goal: Level of anxiety will decrease Outcome: Not Progressing   

## 2022-12-03 NOTE — Progress Notes (Signed)
   12/03/22 1100  Psych Admission Type (Psych Patients Only)  Admission Status Voluntary  Psychosocial Assessment  Patient Complaints Anxiety  Eye Contact Fair  Facial Expression Anxious  Affect Anxious  Speech Logical/coherent  Interaction Assertive  Motor Activity Slow  Appearance/Hygiene Unremarkable  Behavior Characteristics Cooperative  Mood Anxious;Irritable  Thought Process  Coherency WDL  Content WDL  Delusions None reported or observed  Perception WDL  Hallucination None reported or observed  Judgment Impaired  Confusion None  Danger to Self  Current suicidal ideation? Denies  Agreement Not to Harm Self Yes  Description of Agreement verbal  Danger to Others  Danger to Others None reported or observed

## 2022-12-03 NOTE — Group Note (Signed)
LCSW Group Therapy Note  Group Date: 12/03/2022 Start Time: 1315 End Time: 1400   Type of Therapy and Topic:  Group Therapy - How To Cope with Nervousness about Discharge   Participation Level:  None   Description of Group This process group involved identification of patients' feelings about discharge. Some of them are scheduled to be discharged soon, while others are new admissions, but each of them was asked to share thoughts and feelings surrounding discharge from the hospital. One common theme was that they are excited at the prospect of going home, while another was that many of them are apprehensive about sharing why they were hospitalized. Patients were given the opportunity to discuss these feelings with their peers in preparation for discharge.  Therapeutic Goals  Patient will identify their overall feelings about pending discharge. Patient will think about how they might proactively address issues that they believe will once again arise once they get home (i.e. with parents). Patients will participate in discussion about having hope for change.   Summary of Patient Progress:   Patient was present in group. Patient did not engage in group discussion, however, appeared attentive.  Therapeutic Modalities Cognitive Behavioral Therapy   Claudie Fisherman 12/03/2022  3:31 PM

## 2022-12-03 NOTE — Group Note (Signed)
Date:  12/03/2022 Time:  12:16 AM  Group Topic/Focus:  Conflict Resolution:   The focus of this group is to discuss the conflict resolution process and how it may be used upon discharge.    Participation Level:  Active  Participation Quality:  Appropriate  Affect:  Appropriate  Cognitive:  Alert, tearful  Insight: Appropriate  Engagement in Group:  Engaged  Modes of Intervention:  Education  Additional Comments:    Garry Heater 12/03/2022, 12:16 AM

## 2022-12-03 NOTE — Group Note (Signed)
Recreation Therapy Group Note   Group Topic:Leisure Education  Group Date: 12/03/2022 Start Time: 1400 End Time: 1500 Facilitators: Rosina Lowenstein, LRT, CTRS Location:  Day Room  Group Description: Leisure. Patients were given the option to choose from coloring, singing karaoke, or playing cards. LRT and pts discussed the meaning of leisure, the importance of participating in leisure during their free time/when they're outside of the hospital, as well as how our leisure interests can also serve as coping skills. Pt identified two leisure interests and shared with the group.   Goal Area(s) Addressed:  Patient will identify a current leisure interest.  Patient will learn the definition of "leisure". Patient will practice making a positive decision. Patient will have the opportunity to try a new leisure activity. Patient will communicate with peers and LRT.   Affect/Mood: N/A   Participation Level: Did not attend    Clinical Observations/Individualized Feedback: Karen Casey did not attend group.  Plan: Continue to engage patient in RT group sessions 2-3x/week.   Rosina Lowenstein, LRT, CTRS 12/03/2022 3:27 PM

## 2022-12-04 NOTE — Group Note (Signed)
Date:  12/04/2022 Time:  3:45 PM  Group Topic/Focus:  Healthy Communication:   The focus of this group is to discuss communication, barriers to communication, as well as healthy ways to communicate with others.    Participation Level:  Did Not Attend   Karen Casey 12/04/2022, 3:45 PM

## 2022-12-04 NOTE — Group Note (Unsigned)
Date:  12/04/2022 Time:  9:53 AM  Group Topic/Focus:  Healthy Communication:   The focus of this group is to discuss communication, barriers to communication, as well as healthy ways to communicate with others.     Participation Level:  {BHH PARTICIPATION LEVEL:22264}  Participation Quality:  {BHH PARTICIPATION QUALITY:22265}  Affect:  {BHH AFFECT:22266}  Cognitive:  {BHH COGNITIVE:22267}  Insight: {BHH Insight2:20797}  Engagement in Group:  {BHH ENGAGEMENT IN GROUP:22268}  Modes of Intervention:  {BHH MODES OF INTERVENTION:22269}  Additional Comments:  ***  Karen Casey P [name] 12/04/2022, 9:53 AM  

## 2022-12-04 NOTE — Group Note (Unsigned)
Date:  12/04/2022 Time:  3:37 PM  Group Topic/Focus:  Healthy Communication:   The focus of this group is to discuss communication, barriers to communication, as well as healthy ways to communicate with others.     Participation Level:  {BHH PARTICIPATION LEVEL:22264}  Participation Quality:  {BHH PARTICIPATION QUALITY:22265}  Affect:  {BHH AFFECT:22266}  Cognitive:  {BHH COGNITIVE:22267}  Insight: {BHH Insight2:20797}  Engagement in Group:  {BHH ENGAGEMENT IN GROUP:22268}  Modes of Intervention:  {BHH MODES OF INTERVENTION:22269}  Additional Comments:  ***  Erabella Kuipers Y Jhayden Demuro 12/04/2022, 3:37 PM  

## 2022-12-04 NOTE — Group Note (Signed)
Date:  12/04/2022 Time:  11:22 PM  Group Topic/Focus:  Building Self Esteem:   The Focus of this group is helping patients become aware of the effects of self-esteem on their lives, the things they and others do that enhance or undermine their self-esteem, seeing the relationship between their level of self-esteem and the choices they make and learning ways to enhance self-esteem.    Participation Level:  Did Not Attend  Participation Quality:   Did Not Attend  Affect:   Did Not Attend  Cognitive:   Did Not Attend  Insight: None  Engagement in Group:  None  Modes of Intervention:   Did Not Attend  Additional Comments:    Sholom Dulude T Jaquil Todt 12/04/2022, 11:22 PM  

## 2022-12-04 NOTE — Group Note (Signed)
Date:  12/04/2022 Time:  3:58 PM  Group Topic/Focus:  Building Self Esteem:   The Focus of this group is helping patients become aware of the effects of self-esteem on their lives, the things they and others do that enhance or undermine their self-esteem, seeing the relationship between their level of self-esteem and the choices they make and learning ways to enhance self-esteem.    Participation Level:  Did Not Attend   Mai Longnecker L Wilborn Membreno 12/04/2022, 3:58 PM

## 2022-12-04 NOTE — Plan of Care (Signed)
Pt is anxious. Visible on the unit. Interacting with peers. Attends group. States she's feeling anxious. C/o back pain. Ibuprofen 400 mg po and Vistaril 25mg  po given as ordered PRN, respectively. No behavior  issues noted. Denies SI/HI/AVH. Q15 min checks maintained for safety. Plan of care continued.   Problem: Activity: Goal: Risk for activity intolerance will decrease Outcome: Progressing   Problem: Nutrition: Goal: Adequate nutrition will be maintained Outcome: Progressing   Problem: Coping: Goal: Level of anxiety will decrease Outcome: Progressing   Problem: Elimination: Goal: Will not experience complications related to bowel motility Outcome: Progressing Goal: Will not experience complications related to urinary retention Outcome: Progressing   Problem: Pain Managment: Goal: General experience of comfort will improve Outcome: Progressing   Problem: Safety: Goal: Ability to remain free from injury will improve Outcome: Progressing

## 2022-12-04 NOTE — Progress Notes (Signed)
[facility] MD Progress Note  12/04/2022 11:53 AM Karen Casey  MRN:  [id number] Subjective: Patient seen and chart reviewed.  Case discussed with staff. Patient reports that she does not feel like being around people.  Patient was encouraged to work on coping strategies including deep breathing.  Dose of Klonopin was increased yesterday.  Patient reports benefit from increase in the dose of Klonopin.  She reports she slept fine after taking trazodone last night.  No new acute events reported by staff or patient.  Patient denies thoughts of harming self or others.  We discussed discharge plan.  Patient feels close to baseline and ready to go home Monday.  She was encouraged to work on a safe discharge plan   Principal Problem: Severe recurrent major depression without psychotic features (HCC) Diagnosis: Principal Problem:   Severe recurrent major depression without psychotic features (HCC) Active Problems:   GERD (gastroesophageal reflux disease)   COPD (chronic obstructive pulmonary disease) (HCC)   Anemia  Total Time spent with patient: 30 minutes  Past Psychiatric History: Past history of anxiety  Past Medical History:  Past Medical History:  Diagnosis Date   Alcohol abuse    last use 03/09/21, marijuana last 03/09/21   Allergy    Anxiety    Cataract 06/09/2012   Right eye and left eye   COPD (chronic obstructive pulmonary disease) (HCC)    Depression    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/04/2020   Due to benzodiazepine withdrawal   Dyspnea    uses oxygen 2L via [place] prn   Enterococcal bacteremia 02/10/2022   GERD (gastroesophageal reflux disease)    Headache    Hypertension    Long term (current) use of anticoagulants    Nausea vomiting and diarrhea 02/09/2022   Neuromuscular disorder (HCC)    Neuropathy    Positive blood culture 02/10/2022   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   Seizure (HCC)    08/2020 per patient   Seizures (HCC)     xanax withdrawl- December 2013   Urinary incontinence 06/09/2012    Past Surgical History:  Procedure Laterality Date   APPENDECTOMY     BIOPSY  01/04/2021   Procedure: BIOPSY;  Surgeon: Benancio Deeds, MD;  Location: [facility] ENDOSCOPY;  Service: Gastroenterology;;   BIOPSY  03/12/2021   Procedure: BIOPSY;  Surgeon: Lemar Lofty., MD;  Location: [facility] ENDOSCOPY;  Service: Gastroenterology;;   CARDIOVERSION N/A 01/28/2022   Procedure: CARDIOVERSION;  Surgeon: Yates Decamp, MD;  Location: [facility] ENDOSCOPY;  Service: Cardiovascular;  Laterality: N/A;   CATARACT EXTRACTION  06/09/2012   Left eye   COLONOSCOPY WITH PROPOFOL N/A 03/12/2021   Procedure: COLONOSCOPY WITH PROPOFOL;  Surgeon: Lemar Lofty., MD;  Location: [facility] ENDOSCOPY;  Service: Gastroenterology;  Laterality: N/A;   ESOPHAGOGASTRODUODENOSCOPY (EGD) WITH PROPOFOL N/A 01/04/2021   Procedure: ESOPHAGOGASTRODUODENOSCOPY (EGD) WITH PROPOFOL;  Surgeon: Benancio Deeds, MD;  Location: [facility] ENDOSCOPY;  Service: Gastroenterology;  Laterality: N/A;   left shoulder dislocation  Sept 2011   POLYPECTOMY  03/12/2021   Procedure: POLYPECTOMY;  Surgeon: Mansouraty, Netty Starring., MD;  Location: [facility] ENDOSCOPY;  Service: Gastroenterology;;   Family History:  Family History  Problem Relation Age of Onset   Hypertension Mother    Hyperlipidemia Mother    Aneurysm Mother        Rupture - Cause of death   Heart disease Father        MI - cause of death  Depression Father    Parkinsonism Father    Hypertension Sister    Colon cancer Neg Hx    Esophageal cancer Neg Hx    Rectal cancer Neg Hx    Stomach cancer Neg Hx    Family Psychiatric  History: See previous Social History:  Social History   Substance and Sexual Activity  Alcohol Use Yes   Alcohol/week: 3.0 standard drinks of alcohol   Types: 3 Cans of beer per week   Comment: 1-2 times week just beer     Social History   Substance and Sexual Activity  Drug Use Yes    Types: Marijuana, Other-see comments   Comment: Past hx of benzo abuse--xanax    Social History   Socioeconomic History   Marital status: Single    Spouse name: Not on file   Number of children: 1   Years of education: Not on file   Highest education level: Not on file  Occupational History   Not on file  Tobacco Use   Smoking status: Every Day    Packs/day: 0.50    Years: 43.00    Additional pack years: 0.00    Total pack years: 21.50    Types: Cigarettes   Smokeless tobacco: Never   Tobacco comments:    Tobacco info given  Vaping Use   Vaping Use: Never used  Substance and Sexual Activity   Alcohol use: Yes    Alcohol/week: 3.0 standard drinks of alcohol    Types: 3 Cans of beer per week    Comment: 1-2 times week just beer   Drug use: Yes    Types: Marijuana, Other-see comments    Comment: Past hx of benzo abuse--xanax   Sexual activity: Not on file  Other Topics Concern   Not on file  Social History Narrative   Not on file   Social Determinants of Health   Financial Resource Strain: Medium Risk (10/23/2022)   Overall Financial Resource Strain (CARDIA)    Difficulty of Paying Living Expenses: Somewhat hard  Food Insecurity: No Food Insecurity (11/28/2022)   Hunger Vital Sign    Worried About Running Out of Food in the Last Year: Never true    Ran Out of Food in the Last Year: Never true  Transportation Needs: Unmet Transportation Needs (11/28/2022)   PRAPARE - Transportation    Lack of Transportation (Medical): Yes    Lack of Transportation (Non-Medical): Yes  Physical Activity: Inactive (10/23/2022)   Exercise Vital Sign    Days of Exercise per Week: 0 days    Minutes of Exercise per Session: 0 min  Stress: Stress Concern Present (10/23/2022)   [employer] of Occupational Health - Occupational Stress Questionnaire    Feeling of Stress : Very much  Social Connections: Socially Isolated (10/23/2022)   Social Connection and Isolation Panel [NHANES]     Frequency of Communication with Friends and Family: Three times a week    Frequency of Social Gatherings with Friends and Family: Once a week    Attends Religious Services: Never    Database administrator or Organizations: No    Attends Banker Meetings: Never    Marital Status: Widowed   Additional Social History:                         Sleep: Fair  Appetite:  Fair  Current Medications: Current Facility-Administered Medications  Medication Dose Route Frequency Provider Last Rate Last Admin  acetaminophen (TYLENOL) tablet 650 mg  650 mg Oral Q6H PRN Lenard Lance, FNP   650 mg at 12/04/22 0906   albuterol (PROVENTIL) (2.5 MG/3ML) 0.083% nebulizer solution 3 mL  3 mL Inhalation Q6H PRN Lenard Lance, FNP       alum & mag hydroxide-simeth (MAALOX/MYLANTA) 200-200-20 MG/5ML suspension 30 mL  30 mL Oral Q4H PRN Lenard Lance, FNP       amiodarone (PACERONE) tablet 200 mg  200 mg Oral Daily Lenard Lance, FNP   200 mg at 12/04/22 5956   amitriptyline (ELAVIL) tablet 10 mg  10 mg Oral QHS Clapacs, Jackquline Denmark, MD   10 mg at 12/03/22 2145   apixaban (ELIQUIS) tablet 5 mg  5 mg Oral BID Lenard Lance, FNP   5 mg at 12/04/22 3875   clonazePAM (KLONOPIN) tablet 0.5 mg  0.5 mg Oral TID Clapacs, Jackquline Denmark, MD   0.5 mg at 12/04/22 0907   escitalopram (LEXAPRO) tablet 15 mg  15 mg Oral Daily Clapacs, Jackquline Denmark, MD   15 mg at 12/04/22 6433   ferrous sulfate tablet 325 mg  325 mg Oral Otelia Santee, FNP   325 mg at 12/04/22 0907   fluticasone furoate-vilanterol (BREO ELLIPTA) 100-25 MCG/ACT 1 puff  1 puff Inhalation Daily Lenard Lance, FNP   1 puff at 12/04/22 0858   And   umeclidinium bromide (INCRUSE ELLIPTA) 62.5 MCG/ACT 1 puff  1 puff Inhalation Daily Lenard Lance, FNP   1 puff at 12/04/22 0858   gabapentin (NEURONTIN) capsule 800 mg  800 mg Oral TID Lenard Lance, FNP   800 mg at 12/04/22 2951   hydrOXYzine (ATARAX) tablet 25 mg  25 mg Oral TID PRN Lenard Lance, FNP   25 mg  at 12/04/22 0907   ibuprofen (ADVIL) tablet 400 mg  400 mg Oral Q6H PRN Clapacs, Jackquline Denmark, MD   400 mg at 12/03/22 2203   lidocaine (LIDODERM) 5 % 1 patch  1 patch Transdermal Q24H Clapacs, Jackquline Denmark, MD   1 patch at 12/04/22 0908   OLANZapine zydis (ZYPREXA) disintegrating tablet 5 mg  5 mg Oral Q8H PRN Lenard Lance, FNP       And   LORazepam (ATIVAN) tablet 1 mg  1 mg Oral PRN Lenard Lance, FNP       And   ziprasidone (GEODON) injection 20 mg  20 mg Intramuscular PRN Lenard Lance, FNP       magnesium hydroxide (MILK OF MAGNESIA) suspension 30 mL  30 mL Oral Daily PRN Lenard Lance, FNP       nicotine (NICODERM CQ - dosed in mg/24 hours) patch 14 mg  14 mg Transdermal Daily Lenard Lance, FNP   14 mg at 12/04/22 0900   nicotine polacrilex (NICORETTE) gum 2 mg  2 mg Oral PRN Clapacs, Jackquline Denmark, MD       pantoprazole (PROTONIX) EC tablet 40 mg  40 mg Oral Daily Lenard Lance, FNP   40 mg at 12/04/22 8841   QUEtiapine (SEROQUEL) tablet 200 mg  200 mg Oral QHS Clapacs, John T, MD   200 mg at 12/03/22 2145   traZODone (DESYREL) tablet 50 mg  50 mg Oral QHS PRN Lenard Lance, FNP   50 mg at 11/29/22 2128   [START ON 12/05/2022] Vitamin D (Ergocalciferol) (DRISDOL) 1.25 MG (50000 UNIT) capsule 50,000 Units  50,000 Units Oral Q7 days Doran Heater  L, FNP        Lab Results: No results found for this or any previous visit (from the past 48 hour(s)).  Blood Alcohol level:  Lab Results  Component Value Date   ETH <10 11/27/2022   ETH <10 07/26/2022    Metabolic Disorder Labs: Lab Results  Component Value Date   HGBA1C 4.9 11/27/2022   MPG 93.93 11/27/2022   MPG 102.54 09/17/2020   No results found for: "PROLACTIN" Lab Results  Component Value Date   CHOL 132 11/27/2022   TRIG 76 11/27/2022   HDL 79 11/27/2022   CHOLHDL 1.7 11/27/2022   VLDL 15 11/27/2022   LDLCALC 38 11/27/2022   LDLCALC 42 09/17/2020    Physical Findings: AIMS: Facial and Oral Movements Muscles of Facial Expression:  None, normal Lips and Perioral Area: None, normal Jaw: None, normal Tongue: None, normal,Extremity Movements Upper (arms, wrists, hands, fingers): None, normal Lower (legs, knees, ankles, toes): None, normal, Trunk Movements Neck, shoulders, hips: None, normal, Overall Severity Severity of abnormal movements (highest score from questions above): None, normal Incapacitation due to abnormal movements: None, normal Patient's awareness of abnormal movements (rate only patient's report): No Awareness, Dental Status Current problems with teeth and/or dentures?: No Does patient usually wear dentures?: No  CIWA:    COWS:     Musculoskeletal: Strength & Muscle Tone: within normal limits Gait & Station: normal Patient leans: N/A  Psychiatric Specialty Exam:  Presentation  General Appearance:  Appropriate for Environment; Casual  Eye Contact: Good  Speech: Clear and Coherent; Normal Rate  Speech Volume: Normal  Handedness: Right   Mood and Affect  Mood: Anxious  Affect: Congruent with mood   Thought Process  Thought Processes: Coherent; Goal Directed; Linear  Descriptions of Associations:Intact  Orientation:Full (Time, Place and Person)  Thought Content:Logical; WDL Denies suicidal or homicidal ideation  History of Schizophrenia/Schizoaffective disorder:No  Duration of Psychotic Symptoms:No data recorded Hallucinations:No data recorded Ideas of Reference:None  Suicidal Thoughts:No data recorded Homicidal Thoughts:No data recorded  Sensorium  Memory: Immediate Good; Recent Fair  Judgment: Fair  Insight: Present   Executive Functions  Concentration: Good  Attention Span: Good  Recall: Good  Fund of Knowledge: Good  Language: Good   Psychomotor Activity  Psychomotor Activity:No data recorded  Assets  Assets: Communication Skills; Desire for Improvement; Financial Resources/Insurance; Resilience; Social Support   Physical  Exam: Physical Exam Vitals and nursing note reviewed.  Constitutional:      Appearance: Normal appearance.  HENT:     Head: Normocephalic and atraumatic.     Mouth/Throat:     Mouth: Mucous membranes are moist.  Eyes:     Pupils: Pupils are equal, round, and reactive to light.  Cardiovascular:     Rate and Rhythm: Normal rate.  Pulmonary:     Effort: Pulmonary effort is normal.  Musculoskeletal:        General: Normal range of motion.  Skin:    General: Skin is warm and dry.  Neurological:     General: No focal deficit present.     Mental Status: She is alert. Mental status is at baseline.  Psychiatric:        Attention and Perception: Attention normal.        Mood and Affect: Mood normal.        Speech: Speech normal.        Behavior: Behavior is cooperative.        Thought Content: Thought content normal.  Cognition and Memory: Cognition normal.        Judgment: Judgment normal.    Review of Systems  Constitutional: Negative.   HENT: Negative.    Eyes: Negative.   Respiratory: Negative.    Cardiovascular: Negative.   Gastrointestinal: Negative.   Musculoskeletal: Negative.   Skin: Negative.   Neurological: Negative.   Psychiatric/Behavioral: Negative.     Blood pressure (!) 146/79, pulse 64, temperature (!) 97.2 F (36.2 C), resp. rate 17, height 5\' 4"  (1.626 m), weight 61.7 kg, SpO2 99 %. Body mass index is 23.34 kg/m.   Treatment Plan Summary: Medication management and Plan no change to medication although Klonopin was increased to 3 times a day yesterday, 12/03/2022 . Encouragd patient to be participatory as she has been.  She is hoping for discharge  Monday  Lewanda Rife, MD 12/04/2022, 11:53 AM

## 2022-12-04 NOTE — Plan of Care (Signed)
  Problem: Safety: Goal: Ability to remain free from injury will improve Outcome: Progressing   Problem: Coping: Goal: Level of anxiety will decrease Outcome: Not Progressing   Problem: Pain Managment: Goal: General experience of comfort will improve Outcome: Not Progressing   

## 2022-12-04 NOTE — Progress Notes (Signed)
Patient is A+O x 4. She denies SI/HI/AVH. She denies depression but endorses anxiety 10/10. Schedule and PRN meds ordered to address neuro symptoms. Appetite good.    Pain noted to be 8/10 and Tylenol 650 mg adm x 2 doses. Upon follow up, pain decreased to a 4. Hydroxyzine 25 mg adm at 0907 for anxiety. Upon follow up, patient stated she felt relief.  Discharge planning ongoing. Q15 minute unit checks in place.

## 2022-12-04 NOTE — BH IP Treatment Plan (Signed)
Interdisciplinary Treatment and Diagnostic Plan Update  12/04/2022 Time of Session: 11:36 am Karen Casey MRN: [id number]  Principal Diagnosis: Severe recurrent major depression without psychotic features [facility])  Secondary Diagnoses: Principal Problem:   Severe recurrent major depression without psychotic features (HCC) Active Problems:   GERD (gastroesophageal reflux disease)   COPD (chronic obstructive pulmonary disease) (HCC)   Anemia   Current Medications:  Current Facility-Administered Medications  Medication Dose Route Frequency Provider Last Rate Last Admin   acetaminophen (TYLENOL) tablet 650 mg  650 mg Oral Q6H PRN Lenard Lance, FNP   650 mg at 12/04/22 0906   albuterol (PROVENTIL) (2.5 MG/3ML) 0.083% nebulizer solution 3 mL  3 mL Inhalation Q6H PRN Lenard Lance, FNP       alum & mag hydroxide-simeth (MAALOX/MYLANTA) 200-200-20 MG/5ML suspension 30 mL  30 mL Oral Q4H PRN Lenard Lance, FNP       amiodarone (PACERONE) tablet 200 mg  200 mg Oral Daily Lenard Lance, FNP   200 mg at 12/04/22 0865   amitriptyline (ELAVIL) tablet 10 mg  10 mg Oral QHS Clapacs, John T, MD   10 mg at 12/03/22 2145   apixaban (ELIQUIS) tablet 5 mg  5 mg Oral BID Lenard Lance, FNP   5 mg at 12/04/22 7846   clonazePAM (KLONOPIN) tablet 0.5 mg  0.5 mg Oral TID Clapacs, John T, MD   0.5 mg at 12/04/22 0907   escitalopram (LEXAPRO) tablet 15 mg  15 mg Oral Daily Clapacs, Jackquline Denmark, MD   15 mg at 12/04/22 9629   ferrous sulfate tablet 325 mg  325 mg Oral Otelia Santee, FNP   325 mg at 12/04/22 0907   fluticasone furoate-vilanterol (BREO ELLIPTA) 100-25 MCG/ACT 1 puff  1 puff Inhalation Daily Lenard Lance, FNP   1 puff at 12/04/22 0858   And   umeclidinium bromide (INCRUSE ELLIPTA) 62.5 MCG/ACT 1 puff  1 puff Inhalation Daily Lenard Lance, FNP   1 puff at 12/04/22 0858   gabapentin (NEURONTIN) capsule 800 mg  800 mg Oral TID Lenard Lance, FNP   800 mg at 12/04/22 5284   hydrOXYzine (ATARAX)  tablet 25 mg  25 mg Oral TID PRN Lenard Lance, FNP   25 mg at 12/04/22 0907   ibuprofen (ADVIL) tablet 400 mg  400 mg Oral Q6H PRN Clapacs, Jackquline Denmark, MD   400 mg at 12/03/22 2203   lidocaine (LIDODERM) 5 % 1 patch  1 patch Transdermal Q24H Clapacs, Jackquline Denmark, MD   1 patch at 12/04/22 0908   OLANZapine zydis (ZYPREXA) disintegrating tablet 5 mg  5 mg Oral Q8H PRN Lenard Lance, FNP       And   LORazepam (ATIVAN) tablet 1 mg  1 mg Oral PRN Lenard Lance, FNP       And   ziprasidone (GEODON) injection 20 mg  20 mg Intramuscular PRN Lenard Lance, FNP       magnesium hydroxide (MILK OF MAGNESIA) suspension 30 mL  30 mL Oral Daily PRN Lenard Lance, FNP       nicotine (NICODERM CQ - dosed in mg/24 hours) patch 14 mg  14 mg Transdermal Daily Lenard Lance, FNP   14 mg at 12/04/22 0900   nicotine polacrilex (NICORETTE) gum 2 mg  2 mg Oral PRN Clapacs, Jackquline Denmark, MD       pantoprazole (PROTONIX) EC tablet 40 mg  40 mg Oral  Daily Lenard Lance, FNP   40 mg at 12/04/22 6295   QUEtiapine (SEROQUEL) tablet 200 mg  200 mg Oral QHS Clapacs, John T, MD   200 mg at 12/03/22 2145   traZODone (DESYREL) tablet 50 mg  50 mg Oral QHS PRN Lenard Lance, FNP   50 mg at 11/29/22 2128   [START ON 12/05/2022] Vitamin D (Ergocalciferol) (DRISDOL) 1.25 MG (50000 UNIT) capsule 50,000 Units  50,000 Units Oral Q7 days Lenard Lance, FNP       PTA Medications: Medications Prior to Admission  Medication Sig Dispense Refill Last Dose   albuterol (VENTOLIN HFA) 108 (90 Base) MCG/ACT inhaler Inhale 2 puffs into the lungs every 6 (six) hours as needed for wheezing or shortness of breath. 1 each 2    amiodarone (PACERONE) 200 MG tablet Take 1 tablet (200 mg total) by mouth daily. 90 tablet 3    apixaban (ELIQUIS) 5 MG TABS tablet Take 1 tablet (5 mg total) by mouth 2 (two) times daily. 56 tablet 12    ferrous sulfate 325 (65 FE) MG tablet Take 1 tablet (325 mg total) by mouth every other day. 60 tablet 1    Fluticasone-Umeclidin-Vilant  (TRELEGY ELLIPTA) 100-62.5-25 MCG/ACT AEPB Inhale 1 puff into the lungs daily. 28 each 1    gabapentin (NEURONTIN) 800 MG tablet Take 1 tablet (800 mg total) by mouth 3 (three) times daily. 90 tablet 12    pantoprazole (PROTONIX) 40 MG tablet Take 1 tablet (40 mg total) by mouth daily. 30 tablet 4    QUEtiapine (SEROQUEL) 200 MG tablet TAKE 1 TABLET BY MOUTH EVERYDAY AT BEDTIME (Patient not taking: Reported on 11/11/2022) 90 tablet 1    Vitamin D, Ergocalciferol, (DRISDOL) 1.25 MG (50000 UNIT) CAPS capsule Take 1 capsule (50,000 Units total) by mouth every 7 (seven) days. 8 capsule 0     Patient Stressors: Loss of husband 6 months ago   Medication change or noncompliance   Other: etoh abuse    Patient Strengths: Forensic psychologist fund of knowledge  Supportive family/friends   Treatment Modalities: Medication Management, Group therapy, Case management,  1 to 1 session with clinician, Psychoeducation, Recreational therapy.   Physician Treatment Plan for Primary Diagnosis: Severe recurrent major depression without psychotic features (HCC) Long Term Goal(s): Improvement in symptoms so as ready for discharge   Short Term Goals: Compliance with prescribed medications will improve Ability to verbalize feelings will improve Ability to disclose and discuss suicidal ideas Ability to demonstrate self-control will improve  Medication Management: Evaluate patient's response, side effects, and tolerance of medication regimen.  Therapeutic Interventions: 1 to 1 sessions, Unit Group sessions and Medication administration.  Evaluation of Outcomes: Progressing  Physician Treatment Plan for Secondary Diagnosis: Principal Problem:   Severe recurrent major depression without psychotic features (HCC) Active Problems:   GERD (gastroesophageal reflux disease)   COPD (chronic obstructive pulmonary disease) (HCC)   Anemia  Long Term Goal(s): Improvement in symptoms so as ready for discharge    Short Term Goals: Compliance with prescribed medications will improve Ability to verbalize feelings will improve Ability to disclose and discuss suicidal ideas Ability to demonstrate self-control will improve     Medication Management: Evaluate patient's response, side effects, and tolerance of medication regimen.  Therapeutic Interventions: 1 to 1 sessions, Unit Group sessions and Medication administration.  Evaluation of Outcomes: Progressing   RN Treatment Plan for Primary Diagnosis: Severe recurrent major depression without psychotic features (HCC) Long Term Goal(s): Knowledge  of disease and therapeutic regimen to maintain health will improve  Short Term Goals: Ability to verbalize frustration and anger appropriately will improve, Ability to demonstrate self-control, Ability to participate in decision making will improve, Ability to verbalize feelings will improve, Ability to disclose and discuss suicidal ideas, Ability to identify and develop effective coping behaviors will improve, and Compliance with prescribed medications will improve  Medication Management: RN will administer medications as ordered by provider, will assess and evaluate patient's response and provide education to patient for prescribed medication. RN will report any adverse and/or side effects to prescribing provider.  Therapeutic Interventions: 1 on 1 counseling sessions, Psychoeducation, Medication administration, Evaluate responses to treatment, Monitor vital signs and CBGs as ordered, Perform/monitor CIWA, COWS, AIMS and Fall Risk screenings as ordered, Perform wound care treatments as ordered.  Evaluation of Outcomes: Progressing   LCSW Treatment Plan for Primary Diagnosis: Severe recurrent major depression without psychotic features (HCC) Long Term Goal(s): Safe transition to appropriate next level of care at discharge, Engage patient in therapeutic group addressing interpersonal concerns.  Short Term  Goals: Engage patient in aftercare planning with referrals and resources, Increase social support, Increase ability to appropriately verbalize feelings, Increase emotional regulation, Facilitate acceptance of mental health diagnosis and concerns, Identify triggers associated with mental health/substance abuse issues, and Increase skills for wellness and recovery  Therapeutic Interventions: Assess for all discharge needs, 1 to 1 time with Social worker, Explore available resources and support systems, Assess for adequacy in community support network, Educate family and significant other(s) on suicide prevention, Complete Psychosocial Assessment, Interpersonal group therapy.  Evaluation of Outcomes: Progressing   Progress in Treatment: Attending groups: No. Participating in groups: No. Taking medication as prescribed: Yes. Toleration medication: Yes. Family/Significant other contact made: No, will contact:   pt declined Patient understands diagnosis: Yes. Discussing patient identified problems/goals with staff: Yes. Medical problems stabilized or resolved: Yes. Denies suicidal/homicidal ideation: Yes. Issues/concerns per patient self-inventory: No. Other: none  New problem(s) identified: No, Describe:  none  New Short Term/Long Term Goal(s): none at this time.  Patient Goals:  none at this time.  Discharge Plan or Barriers: none at this time.  Reason for Continuation of Hospitalization: Anxiety Depression Medication stabilization Suicidal ideation  Estimated Length of Stay: 1-7 days  Last 3 [place] Suicide Severity Risk Score: Flowsheet Row Admission (Current) from 11/28/2022 in [facility] BEHAVIORAL MEDICINE ED from 11/27/2022 in [facility] ED to Hosp-Admission (Discharged) from 07/26/2022 in [place] LONG 4TH FLOOR PROGRESSIVE CARE AND UROLOGY  C-SSRS RISK CATEGORY No Risk No Risk Low Risk       Last PHQ 2/9 Scores:    10/29/2022   10:40 AM  10/23/2022   10:08 PM 06/11/2022    2:55 PM  Depression screen PHQ 2/9  Decreased Interest 1 3 3   Down, Depressed, Hopeless 2 3 3   PHQ - 2 Score 3 6 6   Altered sleeping 3 3 3   Tired, decreased energy 2 3 3   Change in appetite 1 2 2   Feeling bad or failure about yourself  1 2 2   Trouble concentrating 2 2 2   Moving slowly or fidgety/restless 0 0 2  Suicidal thoughts 0 1 1  PHQ-9 Score 12 19 21   Difficult doing work/chores Not difficult at all      Scribe for Treatment Team: Marshell Levan, LCSW 12/04/2022 11:36 AM

## 2022-12-05 NOTE — Group Note (Signed)
Date:  12/05/2022 Time:  9:01 PM  Group Topic/Focus:  Conflict Resolution:   The focus of this group is to discuss the conflict resolution process and how it may be used upon discharge.    Participation Level:  Active  Participation Quality:  Attentive  Affect:  Appropriate  Cognitive:  Alert  Insight: Limited  Engagement in Group:  Engaged  Modes of Intervention:  Socialization  Additional Comments:    Garry Heater 12/05/2022, 9:01 PM

## 2022-12-05 NOTE — Progress Notes (Signed)
[facility] MD Progress Note  12/05/2022 11:07 AM Karen Casey  MRN:  [id number] Subjective: Patient seen and chart reviewed.  Case discussed with staff. Patient reports she is feeling better today.  Increasing the dose of Klonopin has helped with anxiety.  Patient is requesting to discontinue Seroquel, reports it has in "opposite effect" on her.  She reports she slept fine after taking trazodone last night.  No new acute events reported by staff or patient.  Patient denies thoughts of harming self or others.  We discussed discharge plan.  Patient feels close to baseline and ready to go home tomorrow.  Patient reports her sister will pick patient up upon discharge.  She was encouraged to work on a safe discharge plan   Principal Problem: Severe recurrent major depression without psychotic features (HCC) Diagnosis: Principal Problem:   Severe recurrent major depression without psychotic features (HCC) Active Problems:   GERD (gastroesophageal reflux disease)   COPD (chronic obstructive pulmonary disease) (HCC)   Anemia  Total Time spent with patient: 30 minutes  Past Psychiatric History: Past history of anxiety  Past Medical History:  Past Medical History:  Diagnosis Date   Alcohol abuse    last use 03/09/21, marijuana last 03/09/21   Allergy    Anxiety    Cataract 06/09/2012   Right eye and left eye   COPD (chronic obstructive pulmonary disease) (HCC)    Depression    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/04/2020   Due to benzodiazepine withdrawal   Dyspnea    uses oxygen 2L via [place] prn   Enterococcal bacteremia 02/10/2022   GERD (gastroesophageal reflux disease)    Headache    Hypertension    Long term (current) use of anticoagulants    Nausea vomiting and diarrhea 02/09/2022   Neuromuscular disorder (HCC)    Neuropathy    Positive blood culture 02/10/2022   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   Seizure (HCC)    08/2020 per patient   Seizures  (HCC)    xanax withdrawl- December 2013   Urinary incontinence 06/09/2012    Past Surgical History:  Procedure Laterality Date   APPENDECTOMY     BIOPSY  01/04/2021   Procedure: BIOPSY;  Surgeon: Benancio Deeds, MD;  Location[facility] ENDOSCOPY;  Service: Gastroenterology;;   BIOPSY  03/12/2021   Procedure: BIOPSY;  Surgeon: Lemar Lofty., MD;  Location: [facility] ENDOSCOPY;  Service: Gastroenterology;;   CARDIOVERSION N/A 01/28/2022   Procedure: CARDIOVERSION;  Surgeon: Yates Decamp, MD;  Location: [facility] ENDOSCOPY;  Service: Cardiovascular;  Laterality: N/A;   CATARACT EXTRACTION  06/09/2012   Left eye   COLONOSCOPY WITH PROPOFOL N/A 03/12/2021   Procedure: COLONOSCOPY WITH PROPOFOL;  Surgeon: Lemar Lofty., MD;  Location: [facility] ENDOSCOPY;  Service: Gastroenterology;  Laterality: N/A;   ESOPHAGOGASTRODUODENOSCOPY (EGD) WITH PROPOFOL N/A 01/04/2021   Procedure: ESOPHAGOGASTRODUODENOSCOPY (EGD) WITH PROPOFOL;  Surgeon: Benancio Deeds, MD;  Location: [facility] ENDOSCOPY;  Service: Gastroenterology;  Laterality: N/A;   left shoulder dislocation  Sept 2011   POLYPECTOMY  03/12/2021   Procedure: POLYPECTOMY;  Surgeon: Mansouraty, Netty Starring., MD;  Location: [facility] ENDOSCOPY;  Service: Gastroenterology;;   Family History:  Family History  Problem Relation Age of Onset   Hypertension Mother    Hyperlipidemia Mother    Aneurysm Mother        Rupture - Cause of death   Heart disease Father        MI - cause of death  Depression Father    Parkinsonism Father    Hypertension Sister    Colon cancer Neg Hx    Esophageal cancer Neg Hx    Rectal cancer Neg Hx    Stomach cancer Neg Hx    Family Psychiatric  History: See previous Social History:  Social History   Substance and Sexual Activity  Alcohol Use Yes   Alcohol/week: 3.0 standard drinks of alcohol   Types: 3 Cans of beer per week   Comment: 1-2 times week just beer     Social History   Substance and Sexual Activity  Drug Use  Yes   Types: Marijuana, Other-see comments   Comment: Past hx of benzo abuse--xanax    Social History   Socioeconomic History   Marital status: Single    Spouse name: Not on file   Number of children: 1   Years of education: Not on file   Highest education level: Not on file  Occupational History   Not on file  Tobacco Use   Smoking status: Every Day    Packs/day: 0.50    Years: 43.00    Additional pack years: 0.00    Total pack years: 21.50    Types: Cigarettes   Smokeless tobacco: Never   Tobacco comments:    Tobacco info given  Vaping Use   Vaping Use: Never used  Substance and Sexual Activity   Alcohol use: Yes    Alcohol/week: 3.0 standard drinks of alcohol    Types: 3 Cans of beer per week    Comment: 1-2 times week just beer   Drug use: Yes    Types: Marijuana, Other-see comments    Comment: Past hx of benzo abuse--xanax   Sexual activity: Not on file  Other Topics Concern   Not on file  Social History Narrative   Not on file   Social Determinants of Health   Financial Resource Strain: Medium Risk (10/23/2022)   Overall Financial Resource Strain (CARDIA)    Difficulty of Paying Living Expenses: Somewhat hard  Food Insecurity: No Food Insecurity (11/28/2022)   Hunger Vital Sign    Worried About Running Out of Food in the Last Year: Never true    Ran Out of Food in the Last Year: Never true  Transportation Needs: Unmet Transportation Needs (11/28/2022)   PRAPARE - Transportation    Lack of Transportation (Medical): Yes    Lack of Transportation (Non-Medical): Yes  Physical Activity: Inactive (10/23/2022)   Exercise Vital Sign    Days of Exercise per Week: 0 days    Minutes of Exercise per Session: 0 min  Stress: Stress Concern Present (10/23/2022)   [employer] of Occupational Health - Occupational Stress Questionnaire    Feeling of Stress : Very much  Social Connections: Socially Isolated (10/23/2022)   Social Connection and Isolation Panel [NHANES]     Frequency of Communication with Friends and Family: Three times a week    Frequency of Social Gatherings with Friends and Family: Once a week    Attends Religious Services: Never    Database administrator or Organizations: No    Attends Banker Meetings: Never    Marital Status: Widowed   Additional Social History:                         Sleep: Fair  Appetite:  Fair  Current Medications: Current Facility-Administered Medications  Medication Dose Route Frequency Provider Last Rate Last Admin  acetaminophen (TYLENOL) tablet 650 mg  650 mg Oral Q6H PRN Lenard Lance, FNP   650 mg at 12/05/22 1035   albuterol (PROVENTIL) (2.5 MG/3ML) 0.083% nebulizer solution 3 mL  3 mL Inhalation Q6H PRN Lenard Lance, FNP       alum & mag hydroxide-simeth (MAALOX/MYLANTA) 200-200-20 MG/5ML suspension 30 mL  30 mL Oral Q4H PRN Lenard Lance, FNP       amiodarone (PACERONE) tablet 200 mg  200 mg Oral Daily Lenard Lance, FNP   200 mg at 12/05/22 1034   amitriptyline (ELAVIL) tablet 10 mg  10 mg Oral QHS Clapacs, Jackquline Denmark, MD   10 mg at 12/04/22 2147   apixaban (ELIQUIS) tablet 5 mg  5 mg Oral BID Lenard Lance, FNP   5 mg at 12/05/22 1034   clonazePAM (KLONOPIN) tablet 0.5 mg  0.5 mg Oral TID Clapacs, John T, MD   0.5 mg at 12/05/22 1033   escitalopram (LEXAPRO) tablet 15 mg  15 mg Oral Daily Clapacs, Jackquline Denmark, MD   15 mg at 12/05/22 1033   ferrous sulfate tablet 325 mg  325 mg Oral Otelia Santee, FNP   325 mg at 12/04/22 0907   fluticasone furoate-vilanterol (BREO ELLIPTA) 100-25 MCG/ACT 1 puff  1 puff Inhalation Daily Lenard Lance, FNP   1 puff at 12/05/22 0905   And   umeclidinium bromide (INCRUSE ELLIPTA) 62.5 MCG/ACT 1 puff  1 puff Inhalation Daily Lenard Lance, FNP   1 puff at 12/05/22 9562   gabapentin (NEURONTIN) capsule 800 mg  800 mg Oral TID Lenard Lance, FNP   800 mg at 12/05/22 1032   hydrOXYzine (ATARAX) tablet 25 mg  25 mg Oral TID PRN Lenard Lance, FNP   25  mg at 12/05/22 1030   ibuprofen (ADVIL) tablet 400 mg  400 mg Oral Q6H PRN Clapacs, Jackquline Denmark, MD   400 mg at 12/03/22 2203   lidocaine (LIDODERM) 5 % 1 patch  1 patch Transdermal Q24H Clapacs, Jackquline Denmark, MD   1 patch at 12/05/22 1030   OLANZapine zydis (ZYPREXA) disintegrating tablet 5 mg  5 mg Oral Q8H PRN Lenard Lance, FNP       And   LORazepam (ATIVAN) tablet 1 mg  1 mg Oral PRN Lenard Lance, FNP       And   ziprasidone (GEODON) injection 20 mg  20 mg Intramuscular PRN Lenard Lance, FNP       magnesium hydroxide (MILK OF MAGNESIA) suspension 30 mL  30 mL Oral Daily PRN Lenard Lance, FNP       nicotine (NICODERM CQ - dosed in mg/24 hours) patch 14 mg  14 mg Transdermal Daily Lenard Lance, FNP   14 mg at 12/05/22 1031   nicotine polacrilex (NICORETTE) gum 2 mg  2 mg Oral PRN Clapacs, Jackquline Denmark, MD       pantoprazole (PROTONIX) EC tablet 40 mg  40 mg Oral Daily Lenard Lance, FNP   40 mg at 12/05/22 1035   QUEtiapine (SEROQUEL) tablet 200 mg  200 mg Oral QHS Clapacs, John T, MD   200 mg at 12/03/22 2145   traZODone (DESYREL) tablet 50 mg  50 mg Oral QHS PRN Lenard Lance, FNP   50 mg at 11/29/22 2128   Vitamin D (Ergocalciferol) (DRISDOL) 1.25 MG (50000 UNIT) capsule 50,000 Units  50,000 Units Oral Q7 days Lenard Lance, FNP  50,000 Units at 12/05/22 1037    Lab Results: No results found for this or any previous visit (from the past 48 hour(s)).  Blood Alcohol level:  Lab Results  Component Value Date   ETH <10 11/27/2022   ETH <10 07/26/2022    Metabolic Disorder Labs: Lab Results  Component Value Date   HGBA1C 4.9 11/27/2022   MPG 93.93 11/27/2022   MPG 102.54 09/17/2020   No results found for: "PROLACTIN" Lab Results  Component Value Date   CHOL 132 11/27/2022   TRIG 76 11/27/2022   HDL 79 11/27/2022   CHOLHDL 1.7 11/27/2022   VLDL 15 11/27/2022   LDLCALC 38 11/27/2022   LDLCALC 42 09/17/2020    Physical Findings: AIMS: Facial and Oral Movements Muscles of Facial  Expression: None, normal Lips and Perioral Area: None, normal Jaw: None, normal Tongue: None, normal,Extremity Movements Upper (arms, wrists, hands, fingers): None, normal Lower (legs, knees, ankles, toes): None, normal, Trunk Movements Neck, shoulders, hips: None, normal, Overall Severity Severity of abnormal movements (highest score from questions above): None, normal Incapacitation due to abnormal movements: None, normal Patient's awareness of abnormal movements (rate only patient's report): No Awareness, Dental Status Current problems with teeth and/or dentures?: No Does patient usually wear dentures?: No  CIWA:    COWS:     Musculoskeletal: Strength & Muscle Tone: within normal limits Gait & Station: normal Patient leans: N/A  Psychiatric Specialty Exam:  Presentation  General Appearance:  Appropriate for Environment; Casual  Eye Contact: Good  Speech: Clear and Coherent; Normal Rate  Speech Volume: Normal  Handedness: Right   Mood and Affect  Mood: "Better"  Affect: Congruent with mood   Thought Process  Thought Processes: Coherent; Goal Directed; Linear  Descriptions of Associations:Intact  Orientation:Full (Time, Place and Person)  Thought Content:Logical; WDL Denies suicidal or homicidal ideation  History of Schizophrenia/Schizoaffective disorder:No  Duration of Psychotic Symptoms:No data recorded Hallucinations:No data recorded Ideas of Reference:None  Suicidal Thoughts:No data recorded Homicidal Thoughts:No data recorded  Sensorium  Memory: Immediate Good; Recent Fair  Judgment: Fair  Insight: Present   Executive Functions  Concentration: Good  Attention Span: Good  Recall: Good  Fund of Knowledge: Good  Language: Good   Psychomotor Activity  Psychomotor Activity:No data recorded  Assets  Assets: Communication Skills; Desire for Improvement; Financial Resources/Insurance; Resilience; Social  Support   Physical Exam: Physical Exam Vitals and nursing note reviewed.  Constitutional:      Appearance: Normal appearance.  HENT:     Head: Normocephalic and atraumatic.     Mouth/Throat:     Mouth: Mucous membranes are moist.  Eyes:     Pupils: Pupils are equal, round, and reactive to light.  Cardiovascular:     Rate and Rhythm: Normal rate.  Pulmonary:     Effort: Pulmonary effort is normal.  Skin:    General: Skin is warm and dry.  Neurological:     General: No focal deficit present.     Mental Status: She is alert. Mental status is at baseline.  Psychiatric:        Attention and Perception: Attention normal.        Speech: Speech normal.        Behavior: Behavior is cooperative.        Cognition and Memory: Cognition normal.    Review of Systems  Constitutional: Negative.   HENT: Negative.    Eyes: Negative.   Respiratory: Negative.    Cardiovascular: Negative.   Gastrointestinal: Negative.  Musculoskeletal: Negative.   Skin: Negative.   Neurological: Negative.   Psychiatric/Behavioral: Negative.     Blood pressure 135/83, pulse 85, temperature (!) 97.1 F (36.2 C), resp. rate 16, height 5\' 4"  (1.626 m), weight 61.7 kg, SpO2 96 %. Body mass index is 23.34 kg/m.   Treatment Plan Summary: Medication management and Plan no change to medication although Klonopin was increased to 3 times a day yesterday, 12/03/2022 . Encouragd patient to be participatory as she has been.   Anticipated discharge tomorrow  Lewanda Rife, MD 12/05/2022, 11:07 AM

## 2022-12-05 NOTE — Plan of Care (Signed)
Pt is anxious and irritable. C/o foot pain and endorses anxiety. Tylenol 650mg  po and Vistaril 25mg  po given as ordered PRN, respectively. Tol well. Slept throughout the night. No behavior issues noted. Denies SI/HI/AVH. Q 15 min checks maintained for safety. Plan of care continued.   Problem: Activity: Goal: Risk for activity intolerance will decrease Outcome: Progressing   Problem: Nutrition: Goal: Adequate nutrition will be maintained Outcome: Progressing   Problem: Coping: Goal: Level of anxiety will decrease Outcome: Progressing   Problem: Pain Managment: Goal: General experience of comfort will improve Outcome: Progressing   Problem: Safety: Goal: Ability to remain free from injury will improve Outcome: Progressing

## 2022-12-05 NOTE — Group Note (Deleted)
[facility] LCSW Group Therapy Note   Group Date: 12/05/2022 Start Time: 1305 End Time: 1404   Type of Therapy/Topic:  Group Therapy:  Balance in Life  Participation Level:  Minimal   Description of Group:    This group will address the concept of balance and how it feels and looks when one is unbalanced. Patients will be encouraged to process areas in their lives that are out of balance, and identify reasons for remaining unbalanced. Facilitators will guide patients utilizing problem- solving interventions to address and correct the stressor making their life unbalanced. Understanding and applying boundaries will be explored and addressed for obtaining  and maintaining a balanced life. Patients will be encouraged to explore ways to assertively make their unbalanced needs known to significant others in their lives, using other group members and facilitator for support and feedback.  Therapeutic Goals: Patient will identify two or more emotions or situations they have that consume much of in their lives. Patient will identify signs/triggers that life has become out of balance:  Patient will identify two ways to set boundaries in order to achieve balance in their lives:  Patient will demonstrate ability to communicate their needs through discussion and/or role plays  Summary of Patient Progress:    The patient attended group. The patient stated that she hurt herself when trying not to hurt others. The patient left after about 20 minutes of group.     Therapeutic Modalities:   Cognitive Behavioral Therapy Solution-Focused Therapy Assertiveness Training   Marshell Levan, LCSW

## 2022-12-05 NOTE — Progress Notes (Signed)
Pt has been irritable with staff today, and more demanding to receive items on her timetable. Pt had 2 prn medications for back pain with good results each time. Pt reports increased anxiety and requested prn medicationswhich she received and reported fair results.

## 2022-12-05 NOTE — Plan of Care (Signed)
D- Patient alert and oriented.  Denies SI, HI, AVH, the patient c/o chronic back pain.. Pt is intrusive in others business. A- Scheduled medications administered to patient, per MD orders. Support and encouragement provided.  Routine safety checks conducted every 15 minutes.  Patient informed to notify staff with problems or concerns. R- No adverse drug reactions noted. Patient contracts for safety at this time. Patient compliant with medications and treatment plan. Patient receptive, calm, and cooperative. Patient interacts well with others on the unit.  Patient remains safe at this time.

## 2022-12-05 NOTE — [facility] Group Notes (Signed)
Group Date: 12/05/2022 Start Time: 1305 End Time: 1404     Type of Therapy/Topic:  Group Therapy:  Balance in Life   Participation Level:  did not attend   Description of Group:    This group will address the concept of balance and how it feels and looks when one is unbalanced. Patients will be encouraged to process areas in their lives that are out of balance, and identify reasons for remaining unbalanced. Facilitators will guide patients utilizing problem- solving interventions to address and correct the stressor making their life unbalanced. Understanding and applying boundaries will be explored and addressed for obtaining  and maintaining a balanced life. Patients will be encouraged to explore ways to assertively make their unbalanced needs known to significant others in their lives, using other group members and facilitator for support and feedback.   Therapeutic Goals: Patient will identify two or more emotions or situations they have that consume much of in their lives. Patient will identify signs/triggers that life has become out of balance:  Patient will identify two ways to set boundaries in order to achieve balance in their lives:  Patient will demonstrate ability to communicate their needs through discussion and/or role plays   Summary of Patient Progress:   The patient did not attend group.     Therapeutic Modalities:   Cognitive Behavioral Therapy Solution-Focused Therapy Assertiveness Training     Marshell Levan, LCSW        Deleted by: Marshell Levan, LCSW at 12/05/2022  2:26 PM

## 2022-12-06 DIAGNOSIS — F332 Major depressive disorder, recurrent severe without psychotic features: Secondary | ICD-10-CM | POA: Diagnosis present

## 2022-12-06 MED ORDER — VITAMIN D (ERGOCALCIFEROL) 1.25 MG (50000 UNIT) PO CAPS: 50000.0000 [IU] | ORAL_CAPSULE | ORAL | 0 refills | Status: DC

## 2022-12-06 MED ORDER — LIDOCAINE 5 % EX PTCH: 1.0000 | MEDICATED_PATCH | CUTANEOUS | 0 refills | Status: DC

## 2022-12-06 MED ORDER — ESCITALOPRAM OXALATE 5 MG PO TABS: 15.0000 mg | ORAL_TABLET | Freq: Every day | ORAL | 0 refills | Status: DC

## 2022-12-06 MED ORDER — NICOTINE 14 MG/24HR TD PT24: 14.0000 mg | MEDICATED_PATCH | Freq: Every day | TRANSDERMAL | 0 refills | Status: DC

## 2022-12-06 MED ORDER — APIXABAN 5 MG PO TABS
5.0000 mg | ORAL_TABLET | Freq: Two times a day (BID) | ORAL | 0 refills | Status: DC
Start: 2022-12-06 — End: 2024-04-09

## 2022-12-06 MED ORDER — AMITRIPTYLINE HCL 10 MG PO TABS: 10.0000 mg | ORAL_TABLET | Freq: Every day | ORAL | 0 refills | Status: DC

## 2022-12-06 MED ORDER — GABAPENTIN 400 MG PO CAPS: 800.0000 mg | ORAL_CAPSULE | Freq: Three times a day (TID) | ORAL | 0 refills | Status: DC

## 2022-12-06 MED ORDER — PANTOPRAZOLE SODIUM 40 MG PO TBEC
40.0000 mg | DELAYED_RELEASE_TABLET | Freq: Every day | ORAL | 0 refills | Status: DC
Start: 2022-12-06 — End: 2023-03-25

## 2022-12-06 MED ORDER — TRAZODONE HCL 50 MG PO TABS: 50.0000 mg | ORAL_TABLET | Freq: Every evening | ORAL | 0 refills | Status: DC | PRN

## 2022-12-06 MED ORDER — HYDROXYZINE HCL 25 MG PO TABS: 25.0000 mg | ORAL_TABLET | Freq: Three times a day (TID) | ORAL | 0 refills | Status: DC | PRN

## 2022-12-06 MED ORDER — FERROUS SULFATE 325 (65 FE) MG PO TABS
325.0000 mg | ORAL_TABLET | ORAL | 1 refills | Status: DC
Start: 2022-12-06 — End: 2024-04-09

## 2022-12-06 MED ORDER — CLONAZEPAM 0.5 MG PO TABS: 0.5000 mg | ORAL_TABLET | Freq: Three times a day (TID) | ORAL | 0 refills | Status: DC

## 2022-12-06 MED ORDER — AMIODARONE HCL 200 MG PO TABS
200.0000 mg | ORAL_TABLET | Freq: Every day | ORAL | 0 refills | Status: DC
Start: 2022-12-06 — End: 2023-10-17

## 2022-12-06 NOTE — Discharge Summary (Signed)
Physician Discharge Summary Note  Patient:  Karen Casey is an 67 y.o., female MRN:  [id number] DOB:  06/24/1955 Patient phone:  [phone] (home)  Patient address:   [place] [address],  Total Time spent with patient: 35 minutes  Date of Admission:  11/28/2022 Date of Discharge: 12/06/2022  Reason for Admission: Patient is a 67 year old female who was admitted secondary to suicidal thoughts. No psychotic symptoms. Patient's husband died 6 months ago. Subsequently she left the home they had together and has stayed with relatives. Seems to have had trouble getting along with both her daughter and now her sister. Cannot stand to stay with them in the same house. Mood is gotten worse. Irritable and sad all the time. Denies hallucinations or psychotic symptoms. Denies alcohol abuse.   Principal Problem: Severe recurrent major depression without psychotic features [facility]) Discharge Diagnoses: Principal Problem:   Severe recurrent major depression without psychotic features (HCC) Active Problems:   GERD (gastroesophageal reflux disease)   COPD (chronic obstructive pulmonary disease) (HCC)   Anemia   Past Psychiatric History: Patient has past history of alcohol abuse and depression previously treated several years ago. Past history of suicidal threats   Past Medical History:  Past Medical History:  Diagnosis Date   Alcohol abuse    last use 03/09/21, marijuana last 03/09/21   Allergy    Anxiety    Cataract 06/09/2012   Right eye and left eye   COPD (chronic obstructive pulmonary disease) (HCC)    Depression    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/04/2020   Due to benzodiazepine withdrawal   Dyspnea    uses oxygen 2L via [place] prn   Enterococcal bacteremia 02/10/2022   GERD (gastroesophageal reflux disease)    Headache    Hypertension    Long term (current) use of anticoagulants    Nausea vomiting and diarrhea 02/09/2022   Neuromuscular disorder (HCC)     Neuropathy    Positive blood culture 02/10/2022   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   Seizure (HCC)    08/2020 per patient   Seizures (HCC)    xanax withdrawl- December 2013   Urinary incontinence 06/09/2012    Past Surgical History:  Procedure Laterality Date   APPENDECTOMY     BIOPSY  01/04/2021   Procedure: BIOPSY;  Surgeon: Benancio Deeds, MD;  Location: [facility] ENDOSCOPY;  Service: Gastroenterology;;   BIOPSY  03/12/2021   Procedure: BIOPSY;  Surgeon: Lemar Lofty., MD;  Location: [facility] ENDOSCOPY;  Service: Gastroenterology;;   CARDIOVERSION N/A 01/28/2022   Procedure: CARDIOVERSION;  Surgeon: Yates Decamp, MD;  Location: [facility] ENDOSCOPY;  Service: Cardiovascular;  Laterality: N/A;   CATARACT EXTRACTION  06/09/2012   Left eye   COLONOSCOPY WITH PROPOFOL N/A 03/12/2021   Procedure: COLONOSCOPY WITH PROPOFOL;  Surgeon: Lemar Lofty., MD;  Location: [facility] ENDOSCOPY;  Service: Gastroenterology;  Laterality: N/A;   ESOPHAGOGASTRODUODENOSCOPY (EGD) WITH PROPOFOL N/A 01/04/2021   Procedure: ESOPHAGOGASTRODUODENOSCOPY (EGD) WITH PROPOFOL;  Surgeon: Benancio Deeds, MD;  Location: [facility] ENDOSCOPY;  Service: Gastroenterology;  Laterality: N/A;   left shoulder dislocation  Sept 2011   POLYPECTOMY  03/12/2021   Procedure: POLYPECTOMY;  Surgeon: Mansouraty, Netty Starring., MD;  Location: [facility] ENDOSCOPY;  Service: Gastroenterology;;   Family History:  Family History  Problem Relation Age of Onset   Hypertension Mother    Hyperlipidemia Mother    Aneurysm Mother        Rupture - Cause of death  Heart disease Father        MI - cause of death   Depression Father    Parkinsonism Father    Hypertension Sister    Colon cancer Neg Hx    Esophageal cancer Neg Hx    Rectal cancer Neg Hx    Stomach cancer Neg Hx    social History:  Social History   Substance and Sexual Activity  Alcohol Use Yes   Alcohol/week: 3.0 standard drinks of alcohol   Types: 3 Cans  of beer per week   Comment: 1-2 times week just beer     Social History   Substance and Sexual Activity  Drug Use Yes   Types: Marijuana, Other-see comments   Comment: Past hx of benzo abuse--xanax    Social History   Socioeconomic History   Marital status: Single    Spouse name: Not on file   Number of children: 1   Years of education: Not on file   Highest education level: Not on file  Occupational History   Not on file  Tobacco Use   Smoking status: Every Day    Packs/day: 0.50    Years: 43.00    Additional pack years: 0.00    Total pack years: 21.50    Types: Cigarettes   Smokeless tobacco: Never   Tobacco comments:    Tobacco info given  Vaping Use   Vaping Use: Never used  Substance and Sexual Activity   Alcohol use: Yes    Alcohol/week: 3.0 standard drinks of alcohol    Types: 3 Cans of beer per week    Comment: 1-2 times week just beer   Drug use: Yes    Types: Marijuana, Other-see comments    Comment: Past hx of benzo abuse--xanax   Sexual activity: Not on file  Other Topics Concern   Not on file  Social History Narrative   Not on file   Social Determinants of Health   Financial Resource Strain: Medium Risk (10/23/2022)   Overall Financial Resource Strain (CARDIA)    Difficulty of Paying Living Expenses: Somewhat hard  Food Insecurity: No Food Insecurity (11/28/2022)   Hunger Vital Sign    Worried About Running Out of Food in the Last Year: Never true    Ran Out of Food in the Last Year: Never true  Transportation Needs: Unmet Transportation Needs (11/28/2022)   PRAPARE - Transportation    Lack of Transportation (Medical): Yes    Lack of Transportation (Non-Medical): Yes  Physical Activity: Inactive (10/23/2022)   Exercise Vital Sign    Days of Exercise per Week: 0 days    Minutes of Exercise per Session: 0 min  Stress: Stress Concern Present (10/23/2022)   [employer] of Occupational Health - Occupational Stress Questionnaire    Feeling of  Stress : Very much  Social Connections: Socially Isolated (10/23/2022)   Social Connection and Isolation Panel [NHANES]    Frequency of Communication with Friends and Family: Three times a week    Frequency of Social Gatherings with Friends and Family: Once a week    Attends Religious Services: Never    Database administrator or Organizations: No    Attends Banker Meetings: Never    Marital Status: Widowed    Hospital Course:  The patient was admitted to Inpatient psychiatric treatment for stabilization of psychosis and paranoid behaviors. Patient was placed on suicidal precautions. The patient was evaluated and treated by the multidisciplinary treatment team including physicians, nurses,  social workers and therapists. All medications were presented to the patient and the Patient gave consent to all the medications that they were given, as well as was explained the risks, benefits, side effects and alternatives of all medication therapies. The patient was integrated into the general milieu on the ward and encouraged to attend to her ADLs and participate in all groups and activities. During hospital course the Patient attended coping skill groups, music therapy and activity therapy groups. Patient was counseled on cognitive techniques/skills by multiple staff members and given support care by the staff.  Patient's medication regimen was evaluated and titrated to therapeutic levels to better Patient's overall daily functioning. Specifically, the patient was started on Klonopin, Elavil, and Lexapro.  Patient was initially followed by Dr. Marlou Porch and Dr. Toni Amend.  She tolerated the medication well with no significant side effects.  During the hospitalization, the patient demonstrated a stabilization of mood with decreased racing thoughts,  improved sleep and appetite. At the time of discharge, the patient denied any suicidal ideation/homicidal ideation and was not overtly depressed, manic or  psychotic. The Patient was interacting well in groups and on the unit with their peers. Patient was able to identify a safety plan to include speaking with family, contacting outpatient provider or calling 911 if hallucinations/delusions returned or worsened or thoughts of self-harm or suicide return. Patient was counselled on outpatient follow-up that was arranged prior to discharge.   Physical Findings: AIMS: Facial and Oral Movements Muscles of Facial Expression: None, normal Lips and Perioral Area: None, normal Jaw: None, normal Tongue: None, normal,Extremity Movements Upper (arms, wrists, hands, fingers): None, normal Lower (legs, knees, ankles, toes): None, normal, Trunk Movements Neck, shoulders, hips: None, normal, Overall Severity Severity of abnormal movements (highest score from questions above): None, normal Incapacitation due to abnormal movements: None, normal Patient's awareness of abnormal movements (rate only patient's report): No Awareness, Dental Status Current problems with teeth and/or dentures?: No Does patient usually wear dentures?: No     Psychiatric Specialty Exam:  Presentation  General Appearance:  Appropriate for Environment  Eye Contact: Good  Speech: Clear and Coherent  Speech Volume: Normal  Handedness: Right   Mood and Affect  Mood: Euthymic ("Good")  Affect: Appropriate; Congruent   Thought Process  Thought Processes: Coherent; Goal Directed  Descriptions of Associations:Intact  Orientation:Full (Time, Place and Person)  Thought Content:Logical  History of Schizophrenia/Schizoaffective disorder:No  Duration of Psychotic Symptoms:No data recorded Hallucinations:Hallucinations: None  Ideas of Reference:None  Suicidal Thoughts:Suicidal Thoughts: No  Homicidal Thoughts:Homicidal Thoughts: No   Sensorium  Memory: Recent Fair  Judgment: Fair  Insight: Fair   Art therapist   Concentration: Fair  Attention Span: Fair  Recall: Good  Fund of Knowledge: Fair  Language: Fair   Psychomotor Activity  Psychomotor Activity:No data recorded  Assets  Assets: Communication Skills; Desire for Improvement; Social Support; Transportation; Housing   Sleep  Sleep: Sleep: Fair    Physical Exam: Physical Exam Constitutional:      Appearance: Normal appearance.  HENT:     Head: Normocephalic.     Nose: Nose normal.  Eyes:     Pupils: Pupils are equal, round, and reactive to light.  Cardiovascular:     Rate and Rhythm: Normal rate.  Pulmonary:     Effort: Pulmonary effort is normal.  Skin:    General: Skin is warm and dry.  Neurological:     General: No focal deficit present.     Mental Status: She is alert.  Psychiatric:        Mood and Affect: Mood normal.        Behavior: Behavior normal.        Thought Content: Thought content normal.        Judgment: Judgment normal.    Review of Systems  Constitutional: Negative.   Eyes: Negative.   Respiratory: Negative.    Cardiovascular: Negative.   Neurological:  Negative for dizziness, focal weakness and headaches.  Psychiatric/Behavioral:  Negative for depression, hallucinations and suicidal ideas. The patient is not nervous/anxious.    Blood pressure 137/75, pulse 90, temperature 98.6 F (37 C), resp. rate 18, height 5\' 4"  (1.626 m), weight 65.3 kg, SpO2 98 %. Body mass index is 24.72 kg/m.   Social History   Tobacco Use  Smoking Status Every Day   Packs/day: 0.50   Years: 43.00   Additional pack years: 0.00   Total pack years: 21.50   Types: Cigarettes  Smokeless Tobacco Never  Tobacco Comments   Tobacco info given   Tobacco Cessation:  A prescription for an FDA-approved tobacco cessation medication provided at discharge   Blood Alcohol level:  Lab Results  Component Value Date   [facility] <10 11/27/2022   ETH <10 07/26/2022    Metabolic Disorder Labs:  Lab Results   Component Value Date   HGBA1C 4.9 11/27/2022   MPG 93.93 11/27/2022   MPG 102.54 09/17/2020   No results found for: "PROLACTIN" Lab Results  Component Value Date   CHOL 132 11/27/2022   TRIG 76 11/27/2022   HDL 79 11/27/2022   CHOLHDL 1.7 11/27/2022   VLDL 15 11/27/2022   LDLCALC 38 11/27/2022   LDLCALC 42 09/17/2020    See Psychiatric Specialty Exam and Suicide Risk Assessment completed by Attending Physician prior to discharge.  Discharge destination:  Home  Is patient on multiple antipsychotic therapies at discharge:  No    Allergies as of 12/06/2022       Reactions   Capsaicin-menthol Rash, Other (See Comments)   Burning and peeling on area applied to   Diclo Gel [diclofenac Sodium] Rash, Other (See Comments)   Burning and peeling at the sight of application.        Medication List     STOP taking these medications    gabapentin 800 MG tablet Commonly known as: Neurontin Replaced by: gabapentin 400 MG capsule   QUEtiapine 200 MG tablet Commonly known as: SEROQUEL       TAKE these medications      Indication  albuterol 108 (90 Base) MCG/ACT inhaler Commonly known as: VENTOLIN HFA Inhale 2 puffs into the lungs every 6 (six) hours as needed for wheezing or shortness of breath.    amiodarone 200 MG tablet Commonly known as: PACERONE Take 1 tablet (200 mg total) by mouth daily.    amitriptyline 10 MG tablet Commonly known as: ELAVIL Take 1 tablet (10 mg total) by mouth at bedtime.    apixaban 5 MG Tabs tablet Commonly known as: ELIQUIS Take 1 tablet (5 mg total) by mouth 2 (two) times daily.    clonazePAM 0.5 MG tablet Commonly known as: KLONOPIN Take 1 tablet (0.5 mg total) by mouth 3 (three) times daily.    escitalopram 5 MG tablet Commonly known as: LEXAPRO Take 3 tablets (15 mg total) by mouth daily. Start taking on: December 07, 2022    ferrous sulfate 325 (65 FE) MG tablet Take 1 tablet (325 mg total) by mouth every other day.  gabapentin 400 MG capsule Commonly known as: NEURONTIN Take 2 capsules (800 mg total) by mouth 3 (three) times daily. Replaces: gabapentin 800 MG tablet    hydrOXYzine 25 MG tablet Commonly known as: ATARAX Take 1 tablet (25 mg total) by mouth 3 (three) times daily as needed for anxiety. What changed:  medication strength how much to take reasons to take this    lidocaine 5 % Commonly known as: LIDODERM Place 1 patch onto the skin daily. Remove & Discard patch within 12 hours or as directed by MD Start taking on: December 07, 2022    nicotine 14 mg/24hr patch Commonly known as: NICODERM CQ - dosed in mg/24 hours Place 1 patch (14 mg total) onto the skin daily. Start taking on: December 07, 2022    pantoprazole 40 MG tablet Commonly known as: Protonix Take 1 tablet (40 mg total) by mouth daily.    traZODone 50 MG tablet Commonly known as: DESYREL Take 1 tablet (50 mg total) by mouth at bedtime as needed for sleep.    Trelegy Ellipta 100-62.5-25 MCG/ACT Aepb Generic drug: Fluticasone-Umeclidin-Vilant Inhale 1 puff into the lungs daily.    Vitamin D (Ergocalciferol) 1.25 MG (50000 UNIT) Caps capsule Commonly known as: DRISDOL Take 1 capsule (50,000 Units total) by mouth every 7 (seven) days. Start taking on: December 12, 2022         Follow-up Information     [facility]   Why: Referral has been sent.  They will contact you. Contact information: [address] [place] [address] [phone]                PATIENTS CONDITION AT DISCHARGE:  Stable  PRESCRIPTION ARE LOCATED:  On Chart  DISCHARGE INSTRUCTIONS:  1. Diet: Cardiac, low-sodium 2. Activity: As tolerated  3. Take medications as prescribed and not to make any changes without first consulting with the outpatient provider. 4. Patient should keep all follow up appointments.   Follow-up patient will follow up with PCP and mental health clinic.  Follow-up  appointments per social worker  TIME SPENT ON DISCHARGE: Over 35 minutes were spent on this patients discharge including a face to face encounter, patient counseling and preparation of discharge materials   Signed: Lewanda Rife, MD 12/06/2022, 10:18 AM

## 2022-12-06 NOTE — Plan of Care (Signed)
Pt is irritable and demanding. C/o back pain and anxiety. Tylenol 650 mg and Vistaril 25 mg po given PRN as ordered, respectively with good results. Pt resting quietly in bed with eyes closed. No further complaints offered. Q 15 min checks maintained for safety.   Problem: Activity: Goal: Risk for activity intolerance will decrease Outcome: Progressing   Problem: Nutrition: Goal: Adequate nutrition will be maintained Outcome: Progressing   Problem: Coping: Goal: Level of anxiety will decrease Outcome: Progressing   Problem: Elimination: Goal: Will not experience complications related to bowel motility Outcome: Progressing Goal: Will not experience complications related to urinary retention Outcome: Progressing   Problem: Pain Managment: Goal: General experience of comfort will improve Outcome: Progressing

## 2022-12-06 NOTE — Progress Notes (Signed)
  [facility] Adult Case Management Discharge Plan :  Will you be returning to the same living situation after discharge:  No. Pt has an apartment that her sister helped her get and she will be picking the keys up for today At discharge, do you have transportation home?: Yes,  Pt's sister will pick her up Do you have the ability to pay for your medications:  Yes, [employer] MEDICARE / HU[employer]NA MEDICARE HMO   Release of information consent forms completed and in the chart;  Patient's signature needed at discharge.  Patient to Follow up at:  Follow-up Information     [facility] Follow up.   Why: Referral has been sent.  They will contact you. Contact information: [address] [place] [address] [phone]                 Next level of care provider has access to [facility] Link:no  Safety Planning and Suicide Prevention discussed: No. Pt declined     Has patient been referred to the Quitline?: Patient refused referral for treatment  Patient has been referred for addiction treatment: No known substance use disorder.  [address] LCSWA 12/06/2022, 10:23 AM

## 2022-12-06 NOTE — Care Management Important Message (Signed)
Important Message  Patient Details  Name: Karen Casey MRN: [id number] Date of Birth: [birth year]   Medicare Important Message Given:  Yes     Elza Rafter, LCSWA 12/06/2022, 11:07 AM

## 2022-12-06 NOTE — Progress Notes (Signed)
Pt verbalized understanding of discharge Instructions. Pt left with all belongings. Pt denied any suicidal or homicidal ideation and denies AVS.Pt escorted by staff via wheelchair to lobby , to be picked up by family.

## 2022-12-07 ENCOUNTER — Telehealth: Payer: Self-pay

## 2022-12-07 NOTE — Transitions of Care (Post Inpatient/ED Visit) (Signed)
   12/07/2022  Name: Karen Casey MRN: [id number] DOB: [birth year]  Today's TOC FU Call Status: Today's TOC FU Call Status:: Unsuccessul Call (1st Attempt) Unsuccessful Call (1st Attempt) Date: 12/07/22  Attempted to reach the patient regarding the most recent Inpatient/ED visit.  Follow Up Plan: Additional outreach attempts will be made to reach the patient to complete the Transitions of Care (Post Inpatient/ED visit) call.   Signature [name], LPN [facility] Nurse Health Advisor Direct Dial [phone]

## 2022-12-07 NOTE — BHH Counselor (Signed)
CSW returned call to patient at patient's request.  Patient reported that she was only given a 30 day prescription of medication and will not be able to be seen until the end of August.  She requested information on what she could do in the interim.  CSW informed that she does not prescribe medications, however, patient could contact referral agency to se if they could move her appointment up or put on list should someone cancel.  CSW also provided education on the EDs and pt could potentially reach out to her primary care physician to see if a bride prescription could be written.  Patient also requested information on Medical Records.  CSW provided the contact information for Medical Records.   Karen Casey, MSW, LCSW 12/07/2022 12:56 PM

## 2022-12-07 NOTE — Telephone Encounter (Signed)
Patient calls nurse line regarding scheduling appointment with PCP.   As of now, she is scheduled for PCP's next available on July 12th.   She states that she needs to see PCP sooner. She is asking for appointment for this coming Friday, 6/28. Advised that provider is completely booked this day. She is asking that I send message to provider to ask if she can work her in, due to "everything that is going on with me."   Will forward to PCP.   Veronda Prude, RN

## 2022-12-08 NOTE — Telephone Encounter (Signed)
Called and LVM asking patient to return call to schedule appointment for Friday.   Veronda Prude, RN

## 2022-12-09 NOTE — Telephone Encounter (Signed)
Thank you both!

## 2022-12-09 NOTE — Transitions of Care (Post Inpatient/ED Visit) (Signed)
12/09/2022  Name: Karen Casey MRN: [id number] DOB: May 22, 1956  Today's TOC FU Call Status: Today's TOC FU Call Status:: Successful TOC FU Call Competed Unsuccessful Call (1st Attempt) Date: 12/07/22 [facility] FU Call Complete Date: 12/09/22  Transition Care Management Follow-up Telephone Call Date of Discharge: 12/06/22 Discharge Facility: [facility]) Type of Discharge: Inpatient Admission Primary Inpatient Discharge Diagnosis:: afib How have you been since you were released from the hospital?: Better Any questions or concerns?: No  Items Reviewed: Did you receive and understand the discharge instructions provided?: Yes Medications obtained,verified, and reconciled?: Yes (Medications Reviewed) Any new allergies since your discharge?: No Dietary orders reviewed?: Yes Do you have support at home?: No  Medications Reviewed Today: Medications Reviewed Today     Reviewed by [name], LPN (Licensed Practical Nurse) on 12/09/22 at 1338  Med List Status: <None>   Medication Order Taking? Sig Documenting Provider Last Dose Status Informant  albuterol (VENTOLIN HFA) 108 (90 Base) MCG/ACT inhaler [id number] No Inhale 2 puffs into the lungs every 6 (six) hours as needed for wheezing or shortness of breath. Billey Co, MD Taking Active   amiodarone (PACERONE) 200 MG tablet [id number]  Take 1 tablet (200 mg total) by mouth daily. Lewanda Rife, MD  Active   amitriptyline (ELAVIL) 10 MG tablet [id number]  Take 1 tablet (10 mg total) by mouth at bedtime. Lewanda Rife, MD  Active   apixaban (ELIQUIS) 5 MG TABS tablet [id number]  Take 1 tablet (5 mg total) by mouth 2 (two) times daily. Lewanda Rife, MD  Active   clonazePAM Scarlette Calico) 0.5 MG tablet [id number]  Take 1 tablet (0.5 mg total) by mouth 3 (three) times daily. Lewanda Rife, MD  Active   escitalopram (LEXAPRO) 5 MG tablet [id number]  Take 3 tablets (15 mg total) by mouth daily. Lewanda Rife, MD  Active   ferrous sulfate 325 (65 FE) MG tablet [id number]  Take 1 tablet (325 mg total) by mouth every other day. Lewanda Rife, MD  Active   Fluticasone-Umeclidin-[facility] ELLIPTA) 100-62.5-25 MCG/ACT AEPB [id number] No Inhale 1 puff into the lungs daily. Billey Co, MD Taking Active            Med Note Rosanne Ashing Nov 28, 2022  7:39 AM) Lf 10/29/22 #60  gabapentin (NEURONTIN) 400 MG capsule [id number]  Take 2 capsules (800 mg total) by mouth 3 (three) times daily. Lewanda Rife, MD  Active   Discontinued 10/29/22 1104   hydrOXYzine (ATARAX) 25 MG tablet [id number]  Take 1 tablet (25 mg total) by mouth 3 (three) times daily as needed for anxiety. Lewanda Rife, MD  Active   lidocaine (LIDODERM) 5 % [id number]  Place 1 patch onto the skin daily. Remove & Discard patch within 12 hours or as directed by MD Lewanda Rife, MD  Active   nicotine (NICODERM CQ - DOSED IN MG/24 HOURS) 14 mg/24hr patch [id number]  Place 1 patch (14 mg total) onto the skin daily. Lewanda Rife, MD  Active   pantoprazole (PROTONIX) 40 MG tablet [id number]  Take 1 tablet (40 mg total) by mouth daily. Lewanda Rife, MD  Active   traZODone (DESYREL) 50 MG tablet [id number]  Take 1 tablet (50 mg total) by mouth at bedtime as needed for sleep. Lewanda Rife, MD  Active   Vitamin D, Ergocalciferol, (DRISDOL) 1.25 MG (50000 UNIT) CAPS capsule [id number]  Take 1 capsule (50,000 Units total) by mouth every 7 (seven) days. Lewanda Rife,  MD  Active             Home Care and Equipment/Supplies: Were Home Health Services Ordered?: NA Any new equipment or medical supplies ordered?: NA  Functional Questionnaire: Do you need assistance with bathing/showering or dressing?: No Do you need assistance with meal preparation?: No Do you need assistance with eating?: No Do you have difficulty maintaining continence: No Do you need assistance with getting out of bed/getting out  of a chair/moving?: No Do you have difficulty managing or taking your medications?: No  Follow up appointments reviewed: PCP Follow-up appointment confirmed?: Yes Date of PCP follow-up appointment?: 12/10/22 [facility] Follow-up appointment confirmed?: NA Do you need transportation to your follow-up appointment?: No Do you understand care options if your condition(s) worsen?: Yes-patient verbalized understanding    SIGNATURE [name], LPN [facility] Nurse Health Advisor Direct Dial [phone]

## 2022-12-09 NOTE — Telephone Encounter (Signed)
Patient returns call to nurse line.   She states that she will not have transportation until after 11.   Please advise.   Veronda Prude, RN

## 2022-12-09 NOTE — Telephone Encounter (Signed)
Patient calls nurse line in regards to possible apt tomorrow with PCP.   She reports she can now come tomorrow at 8:50am.   Will add her to PCP to schedule.

## 2022-12-10 ENCOUNTER — Ambulatory Visit (INDEPENDENT_AMBULATORY_CARE_PROVIDER_SITE_OTHER): Payer: Medicare HMO | Admitting: Family Medicine

## 2022-12-10 ENCOUNTER — Encounter: Payer: Self-pay | Admitting: Family Medicine

## 2022-12-10 VITALS — BP 139/70 | HR 80 | Ht 64.0 in | Wt 140.0 lb

## 2022-12-10 DIAGNOSIS — Z789 Other specified health status: Secondary | ICD-10-CM

## 2022-12-10 DIAGNOSIS — F332 Major depressive disorder, recurrent severe without psychotic features: Secondary | ICD-10-CM

## 2022-12-10 DIAGNOSIS — Z122 Encounter for screening for malignant neoplasm of respiratory organs: Secondary | ICD-10-CM

## 2022-12-10 DIAGNOSIS — E222 Syndrome of inappropriate secretion of antidiuretic hormone: Secondary | ICD-10-CM

## 2022-12-10 DIAGNOSIS — F341 Dysthymic disorder: Secondary | ICD-10-CM | POA: Diagnosis not present

## 2022-12-10 DIAGNOSIS — F419 Anxiety disorder, unspecified: Secondary | ICD-10-CM

## 2022-12-10 DIAGNOSIS — E559 Vitamin D deficiency, unspecified: Secondary | ICD-10-CM

## 2022-12-10 DIAGNOSIS — M816 Localized osteoporosis [Lequesne]: Secondary | ICD-10-CM

## 2022-12-10 DIAGNOSIS — E871 Hypo-osmolality and hyponatremia: Secondary | ICD-10-CM

## 2022-12-10 DIAGNOSIS — D508 Other iron deficiency anemias: Secondary | ICD-10-CM

## 2022-12-10 NOTE — Progress Notes (Unsigned)
    SUBJECTIVE:   CHIEF COMPLAINT / HPI:   Hospital follow up- was hospitalized from 6/16-6/24/24 for suicidal thoughts. She was started on Klonopin 0.5 mg TID, Elavil 10mg  at bedtime, and Lexapro 5 mg daily. Her Seroquel was stopped. Hydroxyzine 25mg  TID PRN continued as well. Gabapentin increased to 800mg  TID. Lab work showed a sodium of 131 upon leaving the hospital (she has had a history of suspected SIADH due to SSRI use in the past). TSH and B12 were within normal limits.  Changeher PHQ9 to 0 because she denies SI  Osteoporosis- needs appt with [facility] to start Prolia injection.   Iron deficiency anemia- taking ferrous sulfate every other day, last Hgb two weeks ago was 9.6 up from 8.7. No side effects from the iron.   Vitamin D deficiency- started on 50K weekly x 5 weeks, she has one tab left  Due for repeat lung cancer screening.   PERTINENT  PMH / PSH: HTN, paroxsymal Afib, COPD, GERD, h/o SIADH, peripheral neuropathy, h/o thoracic vertebral compression fracture, depression/anxiety, anemia   OBJECTIVE:   There were no vitals taken for this visit.  General: A&O, NAD HEENT: No sign of trauma, EOM grossly intact Respiratory: normal WOB GI: non-distended  Extremities: no peripheral edema. Neuro: Normal gait, moves all four extremities appropriately Skin: no lesions/rashes visualized Psych: Appropriate mood and affect   ASSESSMENT/PLAN:   No problem-specific Assessment & Plan notes found for this encounter.     Billey Co, MD [facility]

## 2022-12-10 NOTE — Patient Instructions (Addendum)
It was wonderful to see you today.  Please bring ALL of your medications with you to every visit.   Today we talked about:  - We rechecked your sodium today, I will call with result - We scheduled you for your CT lung screening - July 18th at noon - After you take your last Vitamin D Tab, please start taking 2000 international units daily - Please make follow up lab appointment to get your Prolia injection  Thank you for choosing [facility].   Please call [phone] with any questions about today's appointment.  Please arrive at least 15 minutes prior to your scheduled appointments.   If you had blood work today, I will send you a MyChart message or a letter if results are normal. Otherwise, I will give you a call.   If you had a referral placed, they will call you to set up an appointment. Please give [place] a call if you don't hear back in the next 2 weeks.   If you need additional refills before your next appointment, please call your pharmacy first.   Burley Saver, MD  Family Medicine

## 2022-12-11 DIAGNOSIS — Z122 Encounter for screening for malignant neoplasm of respiratory organs: Secondary | ICD-10-CM | POA: Insufficient documentation

## 2022-12-11 LAB — COMPREHENSIVE METABOLIC PANEL
ALT: 19 IU/L (ref 0–32)
AST: 21 IU/L (ref 0–40)
Albumin: 4.5 g/dL (ref 3.9–4.9)
Alkaline Phosphatase: 149 IU/L — ABNORMAL HIGH (ref 44–121)
BUN/Creatinine Ratio: 25 (ref 12–28)
BUN: 22 mg/dL (ref 8–27)
Bilirubin Total: <0.2 mg/dL (ref 0.0–1.2)
CO2: 26 mmol/L (ref 20–29)
Calcium: 9.9 mg/dL (ref 8.7–10.3)
Chloride: 98 mmol/L (ref 96–106)
Creatinine, Ser: 0.87 mg/dL (ref 0.57–1.00)
Globulin, Total: 2.3 g/dL (ref 1.5–4.5)
Glucose: 91 mg/dL (ref 70–99)
Potassium: 4.8 mmol/L (ref 3.5–5.2)
Sodium: 138 mmol/L (ref 134–144)
Total Protein: 6.8 g/dL (ref 6.0–8.5)
eGFR: 74 mL/min/{1.73_m2} (ref >59)

## 2022-12-11 NOTE — Assessment & Plan Note (Signed)
Iron deficiency, continues to take iron, recent CBC with improved Hgb, will continue and repeat in 1 month

## 2022-12-11 NOTE — Assessment & Plan Note (Signed)
Continue 50K weekly x 1 more dose, discussed then start 2000 international units daily

## 2022-12-11 NOTE — Assessment & Plan Note (Signed)
Repeat Ca appropriate, discussed scheduling follow up appt with RN for Prolia injection

## 2022-12-11 NOTE — Assessment & Plan Note (Signed)
Continues to smoke, CT lung screening scheduled

## 2022-12-11 NOTE — Assessment & Plan Note (Signed)
She notes continued abstinence, congratulated patient

## 2022-12-11 NOTE — Assessment & Plan Note (Signed)
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 However, with death of husband, dog, losing her home, and having trouble living with family, she has been through a lot and feeling better since recent psychiatric hospital stay. Thus, I discussed I would refill her Klonopin going forward in 30 tabs a time to minimize risk, until she is able to establish with psychiatry in August. I discussed if they do not feel it is appropriate or are unwilling to prescribe it, if she starts drinking again, if she cancels or misses appointment, I will not be willing to prescribe Klonopin long term and will start a weekly wean as we have done in the past. She states she is aware and in agreement, and PGY1 Fortunato Curling was present to witness this.  Repeat CMP today to montior Na on SSRI, continue Lexapro and Elavil F/u in 1 one month  Patient also asked that I call her several times per week after work- discussed expectations that this would not be possible and recommended continuing counseling which she declines at this time.

## 2022-12-11 NOTE — Assessment & Plan Note (Signed)
Repeat BMP and monitor Na closely on SSRI

## 2022-12-13 ENCOUNTER — Other Ambulatory Visit: Payer: Self-pay | Admitting: Family Medicine

## 2022-12-13 ENCOUNTER — Other Ambulatory Visit: Payer: Self-pay

## 2022-12-13 DIAGNOSIS — F332 Major depressive disorder, recurrent severe without psychotic features: Secondary | ICD-10-CM

## 2022-12-13 MED ORDER — CLONAZEPAM 0.5 MG PO TABS: 0.5000 mg | ORAL_TABLET | Freq: Two times a day (BID) | ORAL | 0 refills | Status: DC | PRN

## 2022-12-13 MED ORDER — AMITRIPTYLINE HCL 10 MG PO TABS: 10.0000 mg | ORAL_TABLET | Freq: Every day | ORAL | 0 refills | Status: DC

## 2022-12-13 MED ORDER — ESCITALOPRAM OXALATE 5 MG PO TABS
15.0000 mg | ORAL_TABLET | Freq: Every day | ORAL | 0 refills | Status: DC
Start: 2022-12-13 — End: 2023-01-03

## 2022-12-13 MED ORDER — LIDOCAINE 5 % EX PTCH: 1.0000 | MEDICATED_PATCH | CUTANEOUS | 1 refills | Status: DC

## 2022-12-13 NOTE — Telephone Encounter (Signed)
Patient calls nurse line requesting refills on lidocaine patches and clonazepam. She is needing refills to be sent to [facility] on [address].   She states that she is trying to only take Clonazepam as needed. She reports taking only one tablet on Saturday and Sunday. She wanted to request this refill early due to the holiday this week.    Will forward refill requests to PCP.   Veronda Prude, RN

## 2022-12-13 NOTE — Telephone Encounter (Signed)
Called patient and provided with update. She is appreciative.   While on call, patient also reports that her amitriptyline and escitalopram got mixed together in one bottle. She reports that pharmacy was not able to distinguish which medications were which.   If appropriate, please send new prescriptions to pharmacy.   Veronda Prude, RN

## 2022-12-13 NOTE — Progress Notes (Signed)
Meds refilled as pt mixed up bottles.

## 2022-12-14 ENCOUNTER — Telehealth: Payer: Self-pay

## 2022-12-14 NOTE — Telephone Encounter (Signed)
Patient calls nurse line in regards to Amitriptyline and Escitalopram.   She reports she mixed these two medications up in the same bottle on accident.   Patient advised to discard bottle with both medications mixed up.   PCP called Amitriptyline and Escitalopram in yesterday. I called pharmacy and they report total cash price for both will be 32 dollars.   Patient reports she can handle the cost. She reports she will discard of the mixed medication bottle and pick up fresh bottles as soon as she can.

## 2022-12-15 ENCOUNTER — Telehealth: Payer: Self-pay

## 2022-12-15 NOTE — Telephone Encounter (Signed)
Hi Page,  Can you let her know I placed the form to be faxed this morning as I had to ensure it was completed fully and only received form on Friday during her clinic appointment, per clinic policy there is a 5-business day turn around on paperwork.  Thank you for sending lidocaine patch for prior authorization, and can you let her know as previously discussed in our appointments I wouldn't recommend muscle relaxer at this time due to other medications that she is on.  Thanks, Dr Miquel Dunn

## 2022-12-15 NOTE — Telephone Encounter (Signed)
Patient calls nurse line for multiple reasons.  (1) She reports she spoke with [employer] and they have not received her application as of this morning. She reports she would like an update on this.   (2) She reports back pain and is requesting a muscle relaxer.   (3) Lidocaine patches are requiring a PA.   Will forward to PCP and [facility] for PA key.

## 2022-12-17 ENCOUNTER — Other Ambulatory Visit (HOSPITAL_COMMUNITY): Payer: Self-pay

## 2022-12-17 ENCOUNTER — Telehealth: Payer: Self-pay | Admitting: Family Medicine

## 2022-12-17 NOTE — Telephone Encounter (Signed)
Patient has been calling nurse line looking for [employer] paperwork to be faxed over.. She said doctor stated she would take care of it. Just checking on forms.   Please Advise.   Thanks!

## 2022-12-17 NOTE — Telephone Encounter (Signed)
Patient called back to nurse line x 3.   Lidocaine patches are still pending prior approval. Attempted to submit via covermymeds. Received error message stating, "Eligibility could not be verified for this patient - patient not found."  Will forward to [place] for further assistance.   Patient is also inquiring about [employer] paperwork. I was unable to find paperwork in previously faxed documents.   Veronda Prude, RN

## 2022-12-20 ENCOUNTER — Telehealth: Payer: Self-pay | Admitting: Family Medicine

## 2022-12-20 IMAGING — CR DG CHEST 2V
2 series · 2 of 2 positions shown · non-contrast
Comparison: 12/05/2020

CLINICAL DATA: Short of breath

EXAM:
CHEST - 2 VIEW

[w chest lat]
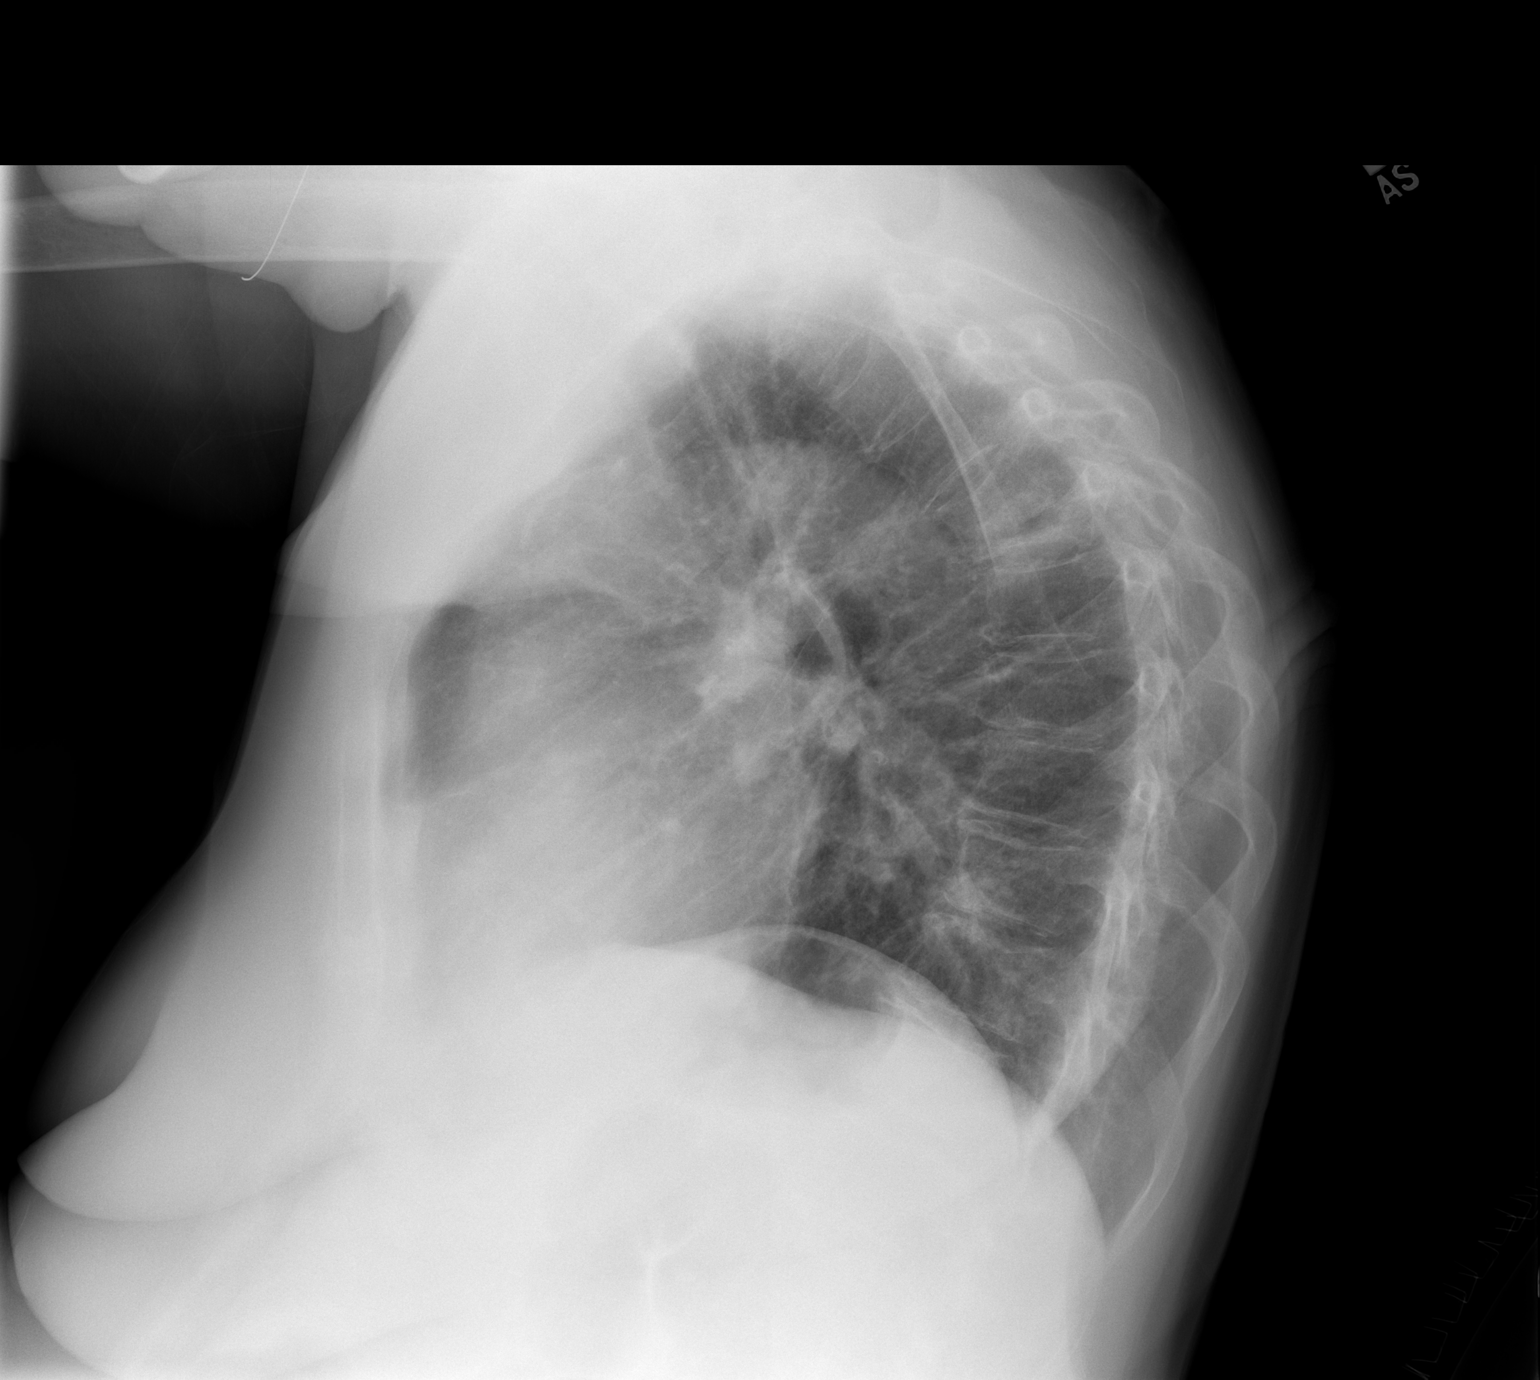

[w chest ap]
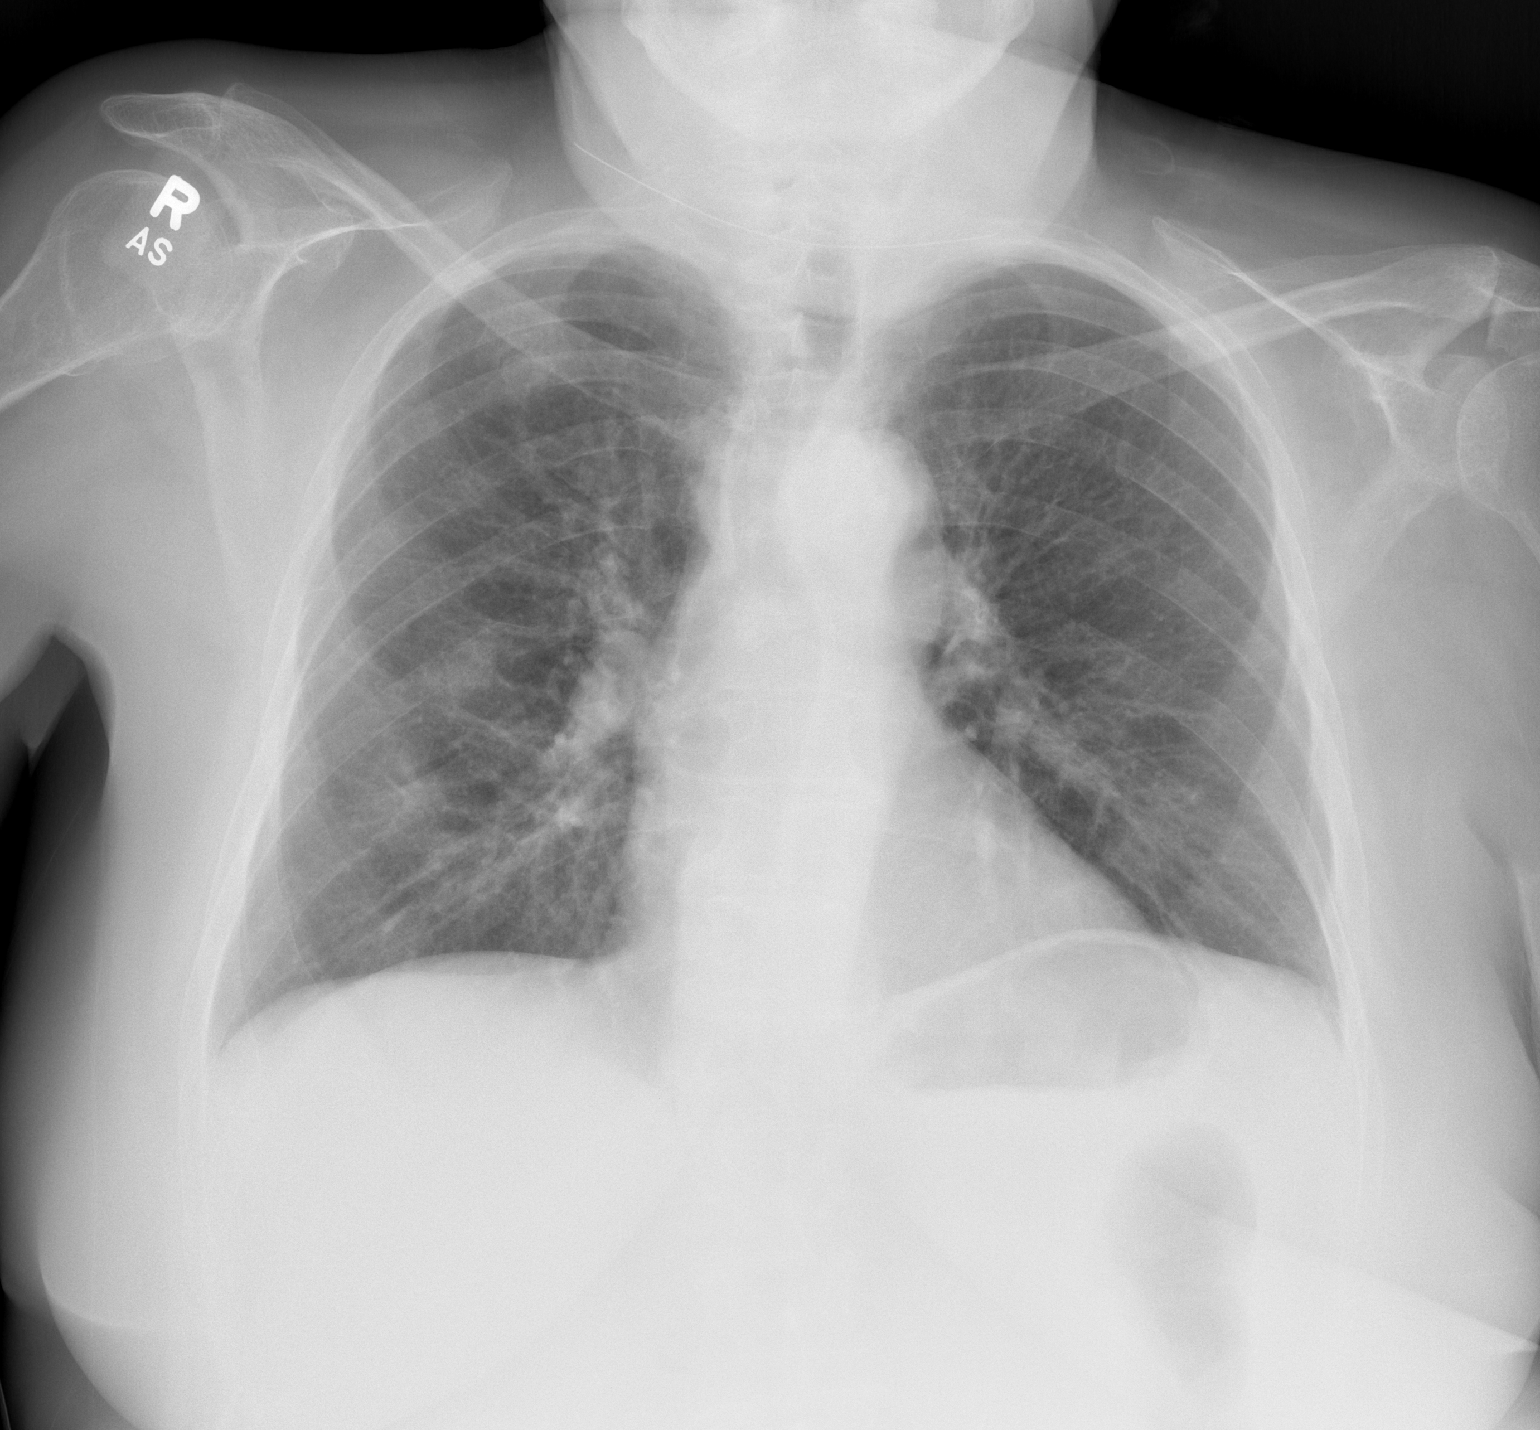

[2 of 2 positions shown; findings below may reference images not displayed]

FINDINGS: Heart size and vascularity normal. Lungs clear and well aerated. No
infiltrate or effusion

Midthoracic compression fractures unchanged from prior chest CT
10/12/2020.
IMPRESSION: No active cardiopulmonary disease.

## 2022-12-20 NOTE — Telephone Encounter (Signed)
[employer] form faxed.  Copy made for batch scanning.   Original is at my desk.

## 2022-12-20 NOTE — Telephone Encounter (Signed)
Patient returns call to nurse line. Left 3 VM.   She states that [employer] paperwork was received, however, was missing page 3. Re-faxed paperwork to [employer].   Called patient and provided with update.   Veronda Prude, RN

## 2022-12-20 NOTE — Telephone Encounter (Signed)
Called patient, discussed OTC lidocaine patches. Recommended she call to schedule RN visit for Prolia injection. When scheduled if her BMP is >37 week old let me know as she will need a repeat lab draw prior to receiving.   [employer] form given to re-fax today.  Burley Saver MD

## 2022-12-20 NOTE — Telephone Encounter (Signed)
Called patient to discuss [employer] forms. RE-faxing over this AM as not receieved from last week.  Discussed lidocaine patches can be purchased over the counter, and offered PT referral for chronic back pain which she states she will think about. Discussed why muscle relaxers or other therapy like opioid therapy would not be appropriate at this time due to other medications she is on. She voiced understanding.   Discussed we have Prolia injection for her, and she can call office to schedule appt for RN visit to receive injection. Ca normal on last BMP. She is taking calcium and vit D supplementation.  Answered all questions and concerns.  Burley Saver MD

## 2022-12-23 ENCOUNTER — Telehealth: Payer: Self-pay | Admitting: Family Medicine

## 2022-12-23 NOTE — Telephone Encounter (Signed)
Patient returns call to nurse line. She is very upset regarding this paperwork not being received by [employer].   Informed patient that we have faxed paperwork multiple times, paperwork has also been emailed.   Advised patient that I would mail her a copy for her records and that I would fax the last page, as this is the page that [employer] states is missing.   Patient is appreciative.   Veronda Prude, RN

## 2022-12-23 NOTE — Telephone Encounter (Signed)
Patient called today about paperwork. I told her I spoke to someone about 20 minutes ago and sent it again and also emailed it as well.   Patient is wanting PCP to call her as soon as she can.   Please Advise.  Thanks!

## 2022-12-24 ENCOUNTER — Ambulatory Visit: Payer: Medicare HMO | Admitting: Family Medicine

## 2022-12-26 ENCOUNTER — Other Ambulatory Visit: Payer: Self-pay | Admitting: Family Medicine

## 2022-12-27 ENCOUNTER — Telehealth: Payer: Self-pay

## 2022-12-27 NOTE — Telephone Encounter (Signed)
Patient calls nurse line requesting new orders for [facility] PT.   She reports she has fallen a few times after her recent move. She reports she has used [facility] in the past and wants to use them again.   Patient encouraged to keep her apt on 7/26 with PCP.   Will forward to PCP.

## 2022-12-28 ENCOUNTER — Ambulatory Visit: Payer: Self-pay

## 2022-12-28 ENCOUNTER — Telehealth: Payer: Self-pay | Admitting: Family Medicine

## 2022-12-28 ENCOUNTER — Other Ambulatory Visit: Payer: Self-pay | Admitting: Family Medicine

## 2022-12-28 DIAGNOSIS — F332 Major depressive disorder, recurrent severe without psychotic features: Secondary | ICD-10-CM

## 2022-12-28 DIAGNOSIS — R262 Difficulty in walking, not elsewhere classified: Secondary | ICD-10-CM

## 2022-12-28 NOTE — Telephone Encounter (Signed)
See phone note, called and left VM for patient recommending appt schedule PT ordered

## 2022-12-28 NOTE — Telephone Encounter (Signed)
Patient returns call to nurse line regarding missed appointment.   Patient apologizes for missing visit this afternoon. Patient reports issues with transportation, as well as improvement in condition.   She states that swelling has improved. Denies redness or warmth to leg. She does report sores on foot that she has been treating with Neosporin. No draining, fever or chills.   Advised that Dr. Miquel Dunn had asked for [place] to reschedule her for tomorrow. Patient reports difficulties with transportation. Advised that appointment would also be with another provider. Patient declines to make appointment at this time as she is requesting to only see Dr. Miquel Dunn.   ED precautions discussed.   Patient verbalizes understanding.   *Patient is also requesting refills on her medications. These need to be sent to [facility] on [address]. See separate medication refill encounter.   Veronda Prude, RN

## 2022-12-28 NOTE — Progress Notes (Deleted)
  SUBJECTIVE:   CHIEF COMPLAINT / HPI:   Multiple Complaints:  Per previous phone note, reports that she was walking with a walker and felt like she lost her balance causing a fall on Sunday, not hitting head without LOC.   Right Foot Swelling  Onset Sunday, three small sores on foot that have been turing red.   Defer Klonopin dosing to PCP  PERTINENT  PMH / PSH:    Patient Care Team: Billey Co, MD as PCP - General (Family Medicine) Curt Bears, MD as Referring Physician (Neurology) OBJECTIVE:  There were no vitals taken for this visit. Physical Exam   ASSESSMENT/PLAN:  There are no diagnoses linked to this encounter. No follow-ups on file. Alfredo Martinez, MD 12/28/2022, 11:37 AM PGY-***, [facility] {    This will disappear when note is signed, click to select method of visit    :1}

## 2022-12-28 NOTE — Telephone Encounter (Addendum)
Patient returns calls about falling. Denies hitting head or loss of consciousness. Walking with walker and feels like she lost her balance and was walking too fast.   She does note that her R foot swollen, hurting. Started Sunday. Denies chest pain or shortness of breath. Has three small sores on foot that she has picked at and are turning red. Discussed needing to be seen by provider sooner rather than later, she is agreeable to scheduling appt in at 3:30 pm in [facility] to assess foot swelling. She will call back if unable to get a ride, I discussed other option of calling 911 to go to ED. Does have history of severe neuropathy thought to be 2/2 alcohol use disorder and has appt with neurologist on 12/30/22.  She notes she has mostly been taking Klonopin once to twice a day, mostly at night to sleep. Discussed refill policy and she is aware.  ED precautions of chest pain, shortness of breath, fever discussed.  Scheduled in [facility] this afternoon at 3:30 pm .  Burley Saver MD

## 2022-12-28 NOTE — Telephone Encounter (Signed)
Called and left VM with patient.  Discussed I was calling to check on the status of her falls, and if she is passing out, hitting her head, or falling multiple times need to be evaluated by provider or go to the ED as she is on a blood thinner. Left clinic number to call back. HHPT referral placed per patient preference.  Also discussed needing to call to make RN clinic appt for Prolia injection that we have at clinic.  Also discussed that I received recent Klonopin refill request from he. Reminded her of previous agreement that I will refill until 01/05/23 when she has her appt with psychiatry, but will not refill after that if psychiatry does not continue to prescribe or believe that medication is appropriate for her. Worry how this can contribute to falls as well.  Left clinic number to call back.  If she calls back: - Please let her know PT referral placed, and assess for need to go to ED vs schedule appt in our clinic due to risk of falling while on blood thinner - please schedule RN clinic visit for Prolia injection (last Calcium wnl on 12/10/22) - Can remind her of agreement noted above for Klonopin refill (last filled 7/3 for 30 tabs BID) - refill appropriate 12/29/22 for 8 days so total of 16 tabs refilled until psychiatry appt on 01/05/23 as previously discussed with patient.  Burley Saver MD

## 2022-12-29 ENCOUNTER — Other Ambulatory Visit: Payer: Self-pay | Admitting: Family Medicine

## 2022-12-29 MED ORDER — GABAPENTIN 400 MG PO CAPS: 800.0000 mg | ORAL_CAPSULE | Freq: Three times a day (TID) | ORAL | 0 refills | Status: DC

## 2022-12-29 NOTE — Progress Notes (Signed)
Gabapentin refilled.   Amitryptyline and lexapro refill not due until 01/10/23

## 2022-12-30 ENCOUNTER — Ambulatory Visit (HOSPITAL_COMMUNITY): Payer: Medicare PPO | Attending: Family Medicine

## 2023-01-01 NOTE — Progress Notes (Signed)
SUBJECTIVE:   CHIEF COMPLAINT / HPI:   Anxiety/depression- had psychiatry appt on 01/05/23, who noted. Saw Dr Janee Morn on [address].They are now prescribing her medications, they are continuing clonazepam for a short period of time as long as she goes to counseling. Ok with signing records request. She has a counseling appt next week with [place]. She is on new medications but not sure which ones, will call back with doses. She notes she still has 9 tabs of the 16 clonazepam given to her at her last refill. Still tearful and processing loss of her partner. Did get a new kitten that she calls "baby girl" that she is excited about. Living in boarding house, has 4 roommates, overall doing well. Lots of family stressors right now.  pAfib- on Eliquis and amiodarone. Denies chest pain, palpitations, shortness of breath.  Osteoporosis- wants Prolia injection today. Ok with coming back for lab work in 2 weeks to check risk of hypocalcemia. Taking vit D and calcium.   Leg wounds/leg swelling- R leg has been swelling for two weeks. Some pain at top of foot and ankle, not at calf. Has been taking her Eliquis. Denies chest pain, shortness of breath, hemoptysis. Did twist that ankle once and is not sure if this is when the swelling started. Has some spots on her legs that are scabs from when she runs into something and then she picks at them. Been putting neosporin on them. No fevers or red streaking.  Peripheral neuropathy- started on trileptal by neurology.  Back pain- continued thoracic and lumbar back pain, no red flag symptoms. Would like to repeat an X-ray for these.   HTN- does not check BP at home bc does not have a cuff anymore. Reluctant to start another BP medicine if she doesn't need it. Wonders if it can be related to stress.  PERTINENT  PMH / PSH: pAfib on Eliquis, thoracic compression fracture, osteoporosis, HTN, peripheral neuroparthy, alcohol use disorder, depression, anemia  OBJECTIVE:    BP (!) 162/82   Pulse 71   Wt 146 lb 6.4 oz (66.4 kg)   SpO2 100%   BMI 25.13 kg/m   General: A&O, NAD HEENT: No sign of trauma, EOM grossly intact Cardiac: irregular rhythm, regular rate, no m/r/g Respiratory: CTAB, normal WOB, no w/c/r GI:  non-distended  Extremities: RLE + TTP around lateral posterior malleolus and entire dorsum of right foot, several small scabs on bilateral legs without surrounding warmth erythema drainage or streaking, 1_ edema of RLE up to mid shin and esp lateral R ankle, no edema of left lower extremitiy, 2+ dorsalis pedis pulses bilaterally MSK: lumbar and thoracic paraspinal muscle TTP, no spine tenderness, no palpable step off, no erythema induration or skin rashes on back Neuro: antalgic gait, walks with cane, strength intact in b/l lower extremities Psych: Appropriate mood and affect, tearful at times  ASSESSMENT/PLAN:   Compression fracture of body of thoracic vertebra (HCC) With continued back pain, both thoracic and lumbar Issues with transportation and going to PT, no new red flag symptoms Will repeat XR lumbar and thoracic per patient request, discussed may need to consider MRI but she is wary of enclosed space  Anemia Iron def anemia, Taking PO ferrous sulfate every other day, repeat labs offered today but she prefers to do in 2 weeks when she returns for labs for her Prolia injection  Paroxysmal atrial fibrillation (HCC) Contine eliquis and amiodarone  Right leg swelling 1+ edema as compared to left leg, with  edema around ankle and TTP of lateral posterior malleoli and surface of entire foot No erythema around wounds or signs of scab/infection, 2+ dorsalis pedis pulses bilaterally and normal cap refill of feet Discussed as she has been taking Eliquis DVT is unlikely but will get [place] to rule out- she denies chest pain, shortness of breath, or hemoptysis, discussed if these develop these would be reasons to go to ED Will get Xray foot/ankle as  she has had some falls to rule out fracture   Osteoporosis Prolia injection given today, normal CMP one month ago Discussed risks of hypocalemia and will schedule CMP in 2 weeks to repeat calcium" Continue vit D and calcium supplementation  HTN (hypertension) Elevated on repeat today, discussed restarting amlodipine she was previously on, she would prefer to check BP at home and then come back for RN visit in 2 weeks for BP check If elevated plan to restart amlodipine 5mg  daily  Severe recurrent major depression without psychotic features (HCC) Now following with psychiatry, and has appt with counseling next week Will call back with her home medications so I can update the list, discussed that now that she has psychiatrist I will not be prescribing benzodiazepines and she is in agreement She is unsure if she is still on amitriptyline- I discussed I had a refill request and will wait until I see what her psychiatrist wants her on before I fill it   Total time of 45 minutes spent face to face with patient and family in delivery of care  Billey Co, MD [facility]

## 2023-01-03 ENCOUNTER — Other Ambulatory Visit: Payer: Self-pay

## 2023-01-03 DIAGNOSIS — F332 Major depressive disorder, recurrent severe without psychotic features: Secondary | ICD-10-CM

## 2023-01-03 MED ORDER — ESCITALOPRAM OXALATE 5 MG PO TABS: 15.0000 mg | ORAL_TABLET | Freq: Every day | ORAL | 0 refills | Status: DC

## 2023-01-04 ENCOUNTER — Other Ambulatory Visit: Payer: Self-pay

## 2023-01-04 DIAGNOSIS — F332 Major depressive disorder, recurrent severe without psychotic features: Secondary | ICD-10-CM

## 2023-01-07 ENCOUNTER — Encounter: Payer: Self-pay | Admitting: Family Medicine

## 2023-01-07 ENCOUNTER — Telehealth: Payer: Self-pay

## 2023-01-07 ENCOUNTER — Telehealth: Payer: Self-pay | Admitting: Family Medicine

## 2023-01-07 ENCOUNTER — Ambulatory Visit (INDEPENDENT_AMBULATORY_CARE_PROVIDER_SITE_OTHER): Payer: Medicare PPO | Admitting: Family Medicine

## 2023-01-07 VITALS — BP 162/82 | HR 71 | Wt 146.4 lb

## 2023-01-07 DIAGNOSIS — F332 Major depressive disorder, recurrent severe without psychotic features: Secondary | ICD-10-CM

## 2023-01-07 DIAGNOSIS — M8000XA Age-related osteoporosis with current pathological fracture, unspecified site, initial encounter for fracture: Secondary | ICD-10-CM | POA: Diagnosis not present

## 2023-01-07 DIAGNOSIS — D649 Anemia, unspecified: Secondary | ICD-10-CM

## 2023-01-07 DIAGNOSIS — I480 Paroxysmal atrial fibrillation: Secondary | ICD-10-CM

## 2023-01-07 DIAGNOSIS — I10 Essential (primary) hypertension: Secondary | ICD-10-CM

## 2023-01-07 DIAGNOSIS — S22000A Wedge compression fracture of unspecified thoracic vertebra, initial encounter for closed fracture: Secondary | ICD-10-CM

## 2023-01-07 DIAGNOSIS — M7989 Other specified soft tissue disorders: Secondary | ICD-10-CM

## 2023-01-07 NOTE — Assessment & Plan Note (Signed)
With continued back pain, both thoracic and lumbar Issues with transportation and going to PT, no new red flag symptoms Will repeat XR lumbar and thoracic per patient request, discussed may need to consider MRI but she is wary of enclosed space

## 2023-01-07 NOTE — Telephone Encounter (Signed)
Patient Karen Casey on nurse line asking that we cancel her appointment for Monday and reschedule for after 11:00 am on Wednesday, 7/31.   Called vascular lab. Canceled appointment for Monday. They were able to get her rescheduled for Wednesday, 7/31 at 1:00 pm. Arrival time of 12:45. This is at the [facility]. They did not have any availability at the [facility].   Called patient to inform of change in appointment.   Karen Casey informing of change and that patient can call back if there are any issues with this appointment.   If patient needs to reschedule, please provide her with the following contact number. [phone].   Veronda Prude, RN

## 2023-01-07 NOTE — Patient Instructions (Addendum)
It was wonderful to see you today.  Please bring ALL of your medications with you to every visit.   Today we talked about:  We will check you anemia when you come back for labs in 2 weeks.  We did your Prolia injection today for your osteoporosis or thin bones, Please come back in 2 weeks to get your labs rechecked to check your calcium. If you feel weak, tired, muscle twitching, or palpitations please call the office or go to the ED. Keep taking your 1200 mg calcium daily and 2000 international units of Vitamin D daily.  We are getting an [place] of your right leg to rule out a clot, if you have worsening pain or swelling, redness, chest pain, shortness of breath, or coughing up blood please go immediately to the ED.   We are also getting X-rays of your feet and back, you can go to [facility] during normal operating hours through Entrance A and ask for the radiology department.  Your leg wounds do not look infected, try to not pick at them. If they start showing worsening redness, streaking up the leg, having pus/drainage, or you start having fevers, please call or go to the ED immediately.  Thank you for choosing [facility].   Please call [phone] with any questions about today's appointment.  Please arrive at least 15 minutes prior to your scheduled appointments.   If you had blood work today, I will send you a MyChart message or a letter if results are normal. Otherwise, I will give you a call.   If you had a referral placed, they will call you to set up an appointment. Please give [place] a call if you don't hear back in the next 2 weeks.   If you need additional refills before your next appointment, please call your pharmacy first.   Burley Saver, MD  Family Medicine

## 2023-01-07 NOTE — Assessment & Plan Note (Signed)
Contine eliquis and amiodarone

## 2023-01-07 NOTE — Assessment & Plan Note (Signed)
Now following with psychiatry, and has appt with counseling next week Will call back with her home medications so I can update the list, discussed that now that she has psychiatrist I will not be prescribing benzodiazepines and she is in agreement She is unsure if she is still on amitriptyline- I discussed I had a refill request and will wait until I see what her psychiatrist wants her on before I fill it

## 2023-01-07 NOTE — Assessment & Plan Note (Signed)
Prolia injection given today, normal CMP one month ago Discussed risks of hypocalemia and will schedule CMP in 2 weeks to repeat calcium" Continue vit D and calcium supplementation

## 2023-01-07 NOTE — Assessment & Plan Note (Addendum)
Iron def anemia, Taking PO ferrous sulfate every other day, repeat labs offered today but she prefers to do in 2 weeks when she returns for labs for her Prolia injection

## 2023-01-07 NOTE — Assessment & Plan Note (Signed)
Elevated on repeat today, discussed restarting amlodipine she was previously on, she would prefer to check BP at home and then come back for RN visit in 2 weeks for BP check If elevated plan to restart amlodipine 5mg  daily

## 2023-01-07 NOTE — Telephone Encounter (Signed)
Called and left VM that DVT US scheduled for Monday 01/10/23 at 11 AM.  Burley Saver MD

## 2023-01-07 NOTE — Assessment & Plan Note (Signed)
1+ edema as compared to left leg, with edema around ankle and TTP of lateral posterior malleoli and surface of entire foot No erythema around wounds or signs of scab/infection, 2+ dorsalis pedis pulses bilaterally and normal cap refill of feet Discussed as she has been taking Eliquis DVT is unlikely but will get [place] to rule out- she denies chest pain, shortness of breath, or hemoptysis, discussed if these develop these would be reasons to go to ED Will get Xray foot/ankle as she has had some falls to rule out fracture

## 2023-01-07 NOTE — Telephone Encounter (Signed)
Called patient and she is aware of her appointment for DVT on Monday the 29 th at 11 am

## 2023-01-10 ENCOUNTER — Ambulatory Visit (HOSPITAL_COMMUNITY): Payer: Medicare PPO

## 2023-01-10 ENCOUNTER — Telehealth: Payer: Self-pay

## 2023-01-10 NOTE — Telephone Encounter (Signed)
Patient calls nurse line regards to SCAT forms.   She reports she spoke with Toni Amend who still reports she has not received all pages despite our best efforts to fax multiple times.  I have refaxed the paperwork again.

## 2023-01-10 NOTE — Addendum Note (Signed)
Addended by: Veronda Prude on: 01/10/2023 08:33 AM   Modules accepted: Orders

## 2023-01-11 MED ORDER — DENOSUMAB 60 MG/ML ~~LOC~~ SOSY
60.0000 mg | PREFILLED_SYRINGE | Freq: Once | SUBCUTANEOUS | Status: AC
Start: 2023-01-11 — End: 2023-01-07
  Administered 2023-01-07: 60 mg via SUBCUTANEOUS

## 2023-01-11 NOTE — Telephone Encounter (Signed)
Patient calls nurse line to inform SCAT forms were received.

## 2023-01-11 NOTE — Addendum Note (Signed)
Addended by: Burley Saver E on: 01/11/2023 09:11 AM   Modules accepted: Orders

## 2023-01-12 ENCOUNTER — Ambulatory Visit: Payer: Medicare PPO | Admitting: Licensed Clinical Social Worker

## 2023-01-12 ENCOUNTER — Telehealth: Payer: Self-pay

## 2023-01-12 ENCOUNTER — Ambulatory Visit (HOSPITAL_COMMUNITY): Admission: RE | Admit: 2023-01-12 | Payer: Medicare PPO | Source: Ambulatory Visit

## 2023-01-12 NOTE — Telephone Encounter (Signed)
Patient calls nurse line regarding pain in right hip and rib pain.    She reports that pain started on Saturday. She states that she has been taking ibuprofen for pain. ~400 mg every 4-6 hours.  She states that she is out of medication and is requesting that provider send in prescription for ibuprofen to [place] on [facility].   Denies chest pain or shortness of breath or recent falls.   She is asking if these symptoms could be side effects of Prolia.   Advised that I would send message to provider for further clarification on side effects of new medication.   *She also wanted to let provider know that she would not be able to afford BP cuff at this time. With Medicare, BP cuff is not covered. Patient would have to pay out of pocket and she does not have these funds at this time.   Veronda Prude, RN

## 2023-01-12 NOTE — Telephone Encounter (Signed)
Called patient back, she notes back and hip pain, and right sided rib pain. Started on Saturday. Denies recent falls, chest pain, and shortness of breath. Is still taking her Eliquis. Denies blood in stool.   Discussed that she should not use NSAIDs because she is on Eliquis, she was confused and thought she shouldn't use tylenol. Discussed that she should use tylenol instead.  Discussed that there are some cases of bone and MSK pain with Prolia. Reviewed more common side effects of Prolia as previously discussed including hypocalcemia, osteonecrosis of the jaw. She is still taking her calcium and vitamin D daily. Discussed my recommendation that she be seen to assess her severe pain that is keeping her from getting out of bed as this could be related to fracture or other pathology. Discussed recommendation to call 911 go to [facility]De, she declines going to ED. Offered clinic appt tomorrow, scheduled for 1:30 pm. She is aware that I recommend ER evaluation now as her pain is on her R rib cage and constant to rule out any relation to cardiac or lung abnormalities and that there is a risk of death and she declines.   She missed her [place] appt today for leg swelling and also has not gotten her X-rays yet. I reiterated importance of these and getting rescheduled.  At appt tomorrow: - repeat CMP to assess calcium - assess back and hip pain, consideration of additional X-rays and encourage patient to get X-rays already scheduled.  Burley Saver MD

## 2023-01-13 ENCOUNTER — Ambulatory Visit: Payer: Self-pay

## 2023-01-18 ENCOUNTER — Other Ambulatory Visit: Payer: Self-pay | Admitting: Family Medicine

## 2023-01-18 ENCOUNTER — Ambulatory Visit (HOSPITAL_COMMUNITY)
Admission: RE | Admit: 2023-01-18 | Discharge: 2023-01-18 | Disposition: A | Discharge status: Home / Self Care | Payer: Medicare PPO | Source: Ambulatory Visit | Attending: Family Medicine | Admitting: Family Medicine

## 2023-01-18 ENCOUNTER — Other Ambulatory Visit (HOSPITAL_COMMUNITY)
Admission: RE | Admit: 2023-01-18 | Discharge: 2023-01-18 | Disposition: A | Discharge status: Home / Self Care | Payer: Medicare PPO | Source: Ambulatory Visit | Attending: Family Medicine | Admitting: Family Medicine

## 2023-01-18 DIAGNOSIS — F332 Major depressive disorder, recurrent severe without psychotic features: Secondary | ICD-10-CM

## 2023-01-18 DIAGNOSIS — M7989 Other specified soft tissue disorders: Secondary | ICD-10-CM | POA: Insufficient documentation

## 2023-01-18 DIAGNOSIS — I480 Paroxysmal atrial fibrillation: Secondary | ICD-10-CM

## 2023-01-18 DIAGNOSIS — S22000A Wedge compression fracture of unspecified thoracic vertebra, initial encounter for closed fracture: Secondary | ICD-10-CM | POA: Diagnosis present

## 2023-01-18 DIAGNOSIS — D649 Anemia, unspecified: Secondary | ICD-10-CM | POA: Insufficient documentation

## 2023-01-18 DIAGNOSIS — I10 Essential (primary) hypertension: Secondary | ICD-10-CM

## 2023-01-18 DIAGNOSIS — M8000XA Age-related osteoporosis with current pathological fracture, unspecified site, initial encounter for fracture: Secondary | ICD-10-CM

## 2023-01-18 LAB — CBC
HCT: 34.1 % — ABNORMAL LOW (ref 36.0–46.0)
Hemoglobin: 10.7 g/dL — ABNORMAL LOW (ref 12.0–15.0)
MCH: 25.7 pg — ABNORMAL LOW (ref 26.0–34.0)
MCHC: 31.4 g/dL (ref 30.0–36.0)
MCV: 82 fL (ref 80.0–100.0)
Platelets: 251 10*3/uL (ref 150–400)
RBC: 4.16 MIL/uL (ref 3.87–5.11)
RDW: 23.9 % — ABNORMAL HIGH (ref 11.5–15.5)
WBC: 10.9 10*3/uL — ABNORMAL HIGH (ref 4.0–10.5)
nRBC: 0 % (ref 0.0–0.2)

## 2023-01-18 LAB — IRON AND TIBC
Iron: 13 ug/dL — ABNORMAL LOW (ref 28–170)
Saturation Ratios: 5 % — ABNORMAL LOW (ref 10.4–31.8)
TIBC: 273 ug/dL (ref 250–450)
UIBC: 260 ug/dL

## 2023-01-19 ENCOUNTER — Ambulatory Visit: Payer: Medicare PPO | Admitting: Licensed Clinical Social Worker

## 2023-01-20 ENCOUNTER — Ambulatory Visit: Payer: Medicare PPO | Admitting: Licensed Clinical Social Worker

## 2023-01-20 ENCOUNTER — Telehealth: Payer: Self-pay

## 2023-01-20 NOTE — Telephone Encounter (Signed)
Patient calls nurse line stating that Lexapro needs authorization at the pharmacy.   Called pharmacy. They state that on 7/23, Dr. Janee Morn (Neuropsychiatrist) sent in Lexapro 20 mg tablets. However, patient picked up 5 mg tablets that Dr. Miquel Dunn prescribed on 7/23. The 20 mg prescription is currently on hold at the pharmacy.   Returned call to patient. She states that she has been taking 3 tablets of the 5 mg Lexapro daily. I asked her if she had spoken to her psychiatrist regarding an increase. She states that she cannot remember.   She is going to reach out to Dr. Janee Morn to see how she should be taking medication.   She will call back to our office if there is anything else needed from Dr. Miquel Dunn.   Veronda Prude, RN

## 2023-01-21 ENCOUNTER — Ambulatory Visit: Payer: Medicare PPO

## 2023-01-24 ENCOUNTER — Telehealth: Payer: Self-pay

## 2023-01-24 ENCOUNTER — Telehealth: Payer: Self-pay | Admitting: Family Medicine

## 2023-01-24 DIAGNOSIS — R911 Solitary pulmonary nodule: Secondary | ICD-10-CM

## 2023-01-24 NOTE — Telephone Encounter (Signed)
Informed patient of upcoming appointment for CT.  Glennie Hawk, CMA

## 2023-01-24 NOTE — Telephone Encounter (Signed)
Called patient to discuss multiple x-rays ordered.  Discussed that she has relatively diffuse osteoarthritis as expected for age.  I recommend she follow-up with Dr. Jason Fila to discuss pain control options.  also discussed findings of concerns for either pneumonia or potential malignancy on her thoracic spine x-ray.  Discussed results with her at length.  She specifically asked if this could be cancer.  We did discuss that she is at increased risk of cancer given her longstanding history of tobacco abuse.  She reports she thinks she may have COVID or another illness.  She reports has been laying in bed for days.  She is able to keep down food and fluids denies dyspnea or chest pain currently.  I recommended she be seen for evaluation   Nursing please call patient to 1) schedule visit and access to care sometime this week  2)schedule her CT

## 2023-01-27 ENCOUNTER — Ambulatory Visit (INDEPENDENT_AMBULATORY_CARE_PROVIDER_SITE_OTHER): Payer: Medicare PPO | Admitting: Licensed Clinical Social Worker

## 2023-01-27 DIAGNOSIS — F332 Major depressive disorder, recurrent severe without psychotic features: Secondary | ICD-10-CM | POA: Diagnosis not present

## 2023-01-27 NOTE — Progress Notes (Signed)
[facility] Counselor/Therapist Progress Note  Patient ID: Karen Casey, MRN: [id number]    Date: 01/27/23  Time Spent: 1010  am - 1110 am : 60 Minutes  Treatment Type: Individual TherapyAssessment/Tx Plan  Reported Symptoms: Grief of husband  Mental Status Exam: Appearance:  Fairly Groomed     Behavior: Sharing  Motor: Normal  Speech/Language:  Slurred  Affect: Flat  Mood: depressed  Thought process: flight of ideas  Thought content:   Tangential  Sensory/Perceptual disturbances:   WNL  Orientation: oriented to person, place, time/date, and situation  Attention: Good  Concentration: Fair  Memory: WNL  Fund of knowledge:  Fair  Insight:   Fair  Judgment:  Fair  Impulse Control: Fair   Risk Assessment: Danger to Self:  No Self-injurious Behavior: No Danger to Others: No Duty to Warn:no Physical Aggression / Violence:No  Access to Firearms a concern: No  Gang Involvement:No   Subjective:   [name] participated from home, via video, and consented to treatment. Therapist participated from office. We met online due to patient request.  Presenting Problem Chief Complaint: Symptoms of depression related to death of husband and life changes.  What are the main stressors in your life right now, how long? Depression  3, Anxiety   3, Mood Swings  3, Sleep Changes   3, Racing Thoughts   3, and Excessive Worrying   3   Previous mental health services Have you ever been treated for a mental health problem, when, where, by whom? Yes  over the course of her life struggled with mental health and substance use   Are you currently seeing a therapist or counselor, counselor's name? No NA  Have you ever had a mental health hospitalization, how many times, length of stay? Yes 2 times at [facility]  Have you ever been treated with medication, name, reason, response? Yes Lexapro and Klonopin  Have you ever had suicidal thoughts or attempted suicide,  when, how? No NA  Risk factors for Suicide Demographic factors:  Divorced or widowed, Caucasian, and Unemployed Current mental status: No plan to harm self or others Loss factors: Loss of significant relationship, Decline in physical health, and Financial problems/change in socioeconomic status Historical factors:  Risk Reduction factors: Sense of responsibility to family Clinical factors:  Severe Anxiety and/or Agitation Depression:   Severe Cognitive features that contribute to risk: NA    SUICIDE RISK:  Minimal: No identifiable suicidal ideation.  Patients presenting with no risk factors but with morbid ruminations; may be classified as minimal risk based on the severity of the depressive symptoms  Medical history Medical treatment and/or problems, explain: Yes Neuropathy, osteoporosis, and afib Do you have any issues with chronic pain?  Yes Neuropathy Name of primary care physician/last physical exam: [facility]  Allergies: No Medication, reactions? na   Current medications:  calcium carbonate (CALCIUM 500 ORAL)  Add as: unknown Dose: 1 tablet Take 1 tablet by mouth daily.    [facility]      Updated on: 12/30/2022    busPIRone HCl       BusPIRone 5MG  TAB New Add as: busPIRone (BUSPAR) 5 MG tablet  SMARTSIG:1 Tablet(s) By Mouth Twice Daily    External Pharmacy   Updated on: 01/04/2023   Months of dispense information: 1; Number of dispenses: 1 Dispense date: 01/04/2023 Qty: 60 each Pharmacy: [facility] [address], [place] - [address] [phone]  Denosumab  Prolia 60 mg/mL syrg syringe New Add as: denosumab (PROLIA) 60 MG/ML SOSY injection   11/26/2022  [facility]   Updated on: 12/30/2022    OXcarbazepine     2  OXCARBAZEPINE 150MG  TABLETS New Add as: OXcarbazepine (TRILEPTAL) 150 MG tablet  SMARTSIG:By Mouth    1 Outside Source, External Pharmacy   Updated on: 01/03/2023   Dispense Date: 01/03/2023;  Rob Bunting: 60 each  Amitriptyline HCl       amitriptyline (ELAVIL) 10 MG tablet On Chart Dose: 10 mg Take 1 tablet (10 mg total) by mouth at bedtime. 12/13/2022  Local Medical Record   Updated on: 01/27/2023   Months of dispense information: 2; Number of dispenses: 1 Dispense date: 12/06/2022 Qty: 30 each Pharmacy: [id number] - [place], [place] - [address] [phone]     amitriptyline (ELAVIL) 10 mg tablet Similar Dose: 1 tablet Take 1 tablet by mouth nightly.  12/13/2022  [facility]   Updated on: 12/30/2022    hydrOXYzine HCl           Updated on:  Months of dispense information: 5; Number of dispenses: 3 Dispense date: 11/09/2022 Pharmacy: [id number] - [place], [place] - [address] [phone]     hydrOXYzine (ATARAX) 25 MG tablet On Chart Dose: 25 mg Take 1 tablet (25 mg total) by mouth 3 (three) times daily as needed for anxiety. 12/06/2022  Local [facility] Record   Updated on: 01/27/2023     2  HydrOXYzine HCL 50MG  TAB Similar Add as: hydrOXYzine (ATARAX) 50 MG tablet  SMARTSIG:1 Tablet(s) By Mouth 3 Times Daily PRN    1 Outside Source, External Pharmacy   Updated on: 01/04/2023   Dispense Date: 01/04/2023; Rob Bunting: 90 each  traZODone HCl       traZODone (DESYREL) 50 MG tablet On Chart Dose: 50 mg Take 1 tablet (50 mg total) by mouth at bedtime as needed for sleep. 12/06/2022  Local Medical Record   Updated on: 01/27/2023   Months of dispense information: 2; Number of dispenses: 2 Dispense date: 01/04/2023 Qty: 30 each Pharmacy: [facility] [address] [place] - [address] [phone]     traZODone (DESYREL) 50 mg tablet Similar Dose: 50 mg Take 50 mg by mouth at bedtime.  12/06/2022  [facility]   Updated on: 12/30/2022    Albuterol Sulfate       albuterol (VENTOLIN HFA) 108 (90 Base) MCG/ACT inhaler On Chart Dose: 2 puff Inhale 2 puffs into the lungs every 6 (six) hours  as needed for wheezing or shortness of breath. 10/29/2022  Local [facility] Record   Updated on: 01/27/2023   Months of dispense information: 3; Number of dispenses: 2 Dispense date: 01/23/2023 Qty: 18 g  Amiodarone HCl       amiodarone (PACERONE) 200 MG tablet On Chart Dose: 200 mg Take 1 tablet (200 mg total) by mouth daily. 12/06/2022  Local Medical Record   Updated on: 01/27/2023   Months of dispense information: 5; Number of dispenses: 2 Dispense date: 12/06/2022 Qty: 90 each Pharmacy: [id number] - [place], [place] - [address] [phone]  Apixaban       apixaban (ELIQUIS) 5 MG TABS tablet On Chart Dose: 5 mg Take 1 tablet (5 mg total) by mouth 2 (two) times daily. 12/06/2022  Local Medical Record   Updated on: 01/27/2023   Months of dispense  information: 3; Number of dispenses: 2 Dispense date: 01/22/2023 Qty: 56 each  clonazePAM       clonazePAM (KLONOPIN) 0.5 MG tablet On Chart Dose: 0.5 mg Take 1 tablet (0.5 mg total) by mouth 2 (two) times daily as needed for anxiety. Ok to fill 12/29/22 12/28/2022  Local Medical Record   Updated on: 01/27/2023   Months of dispense information: 2; Number of dispenses: 4 Dispense date: 01/04/2023 Qty: 60 each  Ergocalciferol       Vitamin D, Ergocalciferol, (DRISDOL) 1.25 MG (50000 UNIT) CAPS capsule On Chart Dose: 50,000 Units Take 1 capsule (50,000 Units total) by mouth every 7 (seven) days. 12/12/2022  Local Medical Record   Updated on: 01/27/2023   Months of dispense information: 3; Number of dispenses: 1 Dispense date: 11/01/2022 Qty: 8 each Pharmacy: [id number] - [place], [place] - [address] [phone]  Escitalopram Oxalate       escitalopram (LEXAPRO) 5 MG tablet On Chart Dose: 15 mg Take 3 tablets (15 mg total) by mouth daily. 01/03/2023  Local Medical Record   Updated on: 01/27/2023   Months of dispense information: 2; Number of dispenses:  2 Dispense date: 01/03/2023 Qty: 90 each  Ferrous Sulfate       ferrous sulfate 325 (65 FE) MG tablet On Chart Dose: 325 mg Take 1 tablet (325 mg total) by mouth every other day. 12/06/2022  Local Medical Record   Updated on: 01/27/2023   Months of dispense information: 3; Number of dispenses: 1 Dispense date: 11/01/2022 Qty: 45 each Pharmacy: [id number] - [place], [place] - [address] [phone]  Fluticasone-Umeclidin-Vilant       Fluticasone-Umeclidin-Vilant (TRELEGY ELLIPTA) 100-62.5-25 MCG/ACT AEPB On Chart Dose: 1 puff Inhale 1 puff into the lungs daily. 10/29/2022  Local Medical Record   Updated on: 01/27/2023   Months of dispense information: 3; Number of dispenses: 2 Dispense date: 01/20/2023 Qty: 60 each  Gabapentin       gabapentin (NEURONTIN) 400 MG capsule On Chart Dose: 800 mg Take 2 capsules (800 mg total) by mouth 3 (three) times daily. 12/29/2022  Local Medical Record   Updated on: 01/27/2023   Months of dispense information: 1; Number of dispenses: 1 Dispense date: 12/29/2022 Qty: 180 each Pharmacy: [facility] [address], [place] - [address] [phone]  Lidocaine       lidocaine (LIDODERM) 5 % On Chart Dose: 1 patch Place 1 patch onto the skin daily. Remove & Discard patch within 12 hours or as directed by MD 12/13/2022  Local Medical Record   Updated on: 01/27/2023    Pantoprazole Sodium       pantoprazole (PROTONIX) 40 MG tablet On Chart Dose: 40 mg Take 1 tablet (40 mg total) by mouth daily. 12/06/2022  Local Medical Record   Updated on: 01/27/2023   Months of dispense information: 3; Number of dispenses: 2 Dispense date: 01/20/2023 Qty: 30 each   Prescribed by: Burley Saver Is there any history of mental health problems or substance abuse in your family, whom? Yes Cousins and uncle and parents had anxiety Has anyone in your family been hospitalized, who, where, length of stay? No  NA  Social/family history Have you been married, how many times?  1  Do you have children?  1  How many pregnancies have you had?  2  Who lives in your current household? Patient and 2 roommates  [employer]  history: No   Religious/spiritual involvement: Church on occasion What religion/faith base are you? Baptist  Family of origin (childhood history)  Mom, Dad, Patient and Sister  Where were you born? [place] Where did you grow up? [place] How many different homes have you lived? 4 Describe the atmosphere of the household where you grew up: "Very Loving family." Do you have siblings, step/half siblings, list names, relation, sex, age? Yes Sister-Debbie -69  Are your parents separated/divorced, when and why? No   Are your parents alive? No Both parents deceased  Social supports (personal and professional): Roommates  Education How many grades have you completed? some college Did you have any problems in school, what type? No NA Medications prescribed for these problems? No NA  Employment (financial issues): retired, patient reports that she has struggles Sales executive history: Arrested for DUI in 1983   Trauma/Abuse history: Have you ever been exposed to any form of abuse, what type? No   Have you ever been exposed to something traumatic, describe? No   Substance use Do you use Caffeine? No Type, frequency? NA  Do you use Nicotine? Yes Type, frequency, ppd? 1/2 pack daily   Do you use Alcohol? No Type, frequency? NA  How old were you went you first tasted alcohol? 18 Was this accepted by your family? No  When was your last drink, type, how much? Beer last friday  Have you ever used illicit drugs or taken more than prescribed, type, frequency, date of last usage? 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  Diagnosis AXIS I Major Depression, Recurrent severe  AXIS II No diagnosis  AXIS III @[facility] @  AXIS IV  problems with access to health care services  AXIS V 61-70 mild symptoms   Treatment Plan:  Patient reports depression related to losing her husband and having to move from their home into a group living situation with two roommates. Patient presented with slurred speech and stated she had just woke up. Patient reports that she has had a lifetime struggle with mental illness and substance use. 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    Client Abilities/Strengths:  "I'm a good loyal friend." "I like helping people."  Support System: Client Treatment Preferences: Cognitive Behavioral Therapy  Client Statement of Needs: "I want to get help with my grief, and not worry so much and cry so much."        Treatment Level Every other week  Symptoms: Depression and anxiety, feelings of loneliness.  Goals: "I want to get help with my grief, and not worry so much and cry so much."  Target Date: 01/27/2024 Frequency: biweekly  Progress: 0 Modality: individual    Therapist will provide referrals for additional resources as appropriate.  Therapist will provide psycho-education regarding anxiety and depression related to her grief  diagnosis and corresponding treatment approaches and interventions. Licensed Clinical Social Worker, [name], LCSW will support the patient's ability to achieve the goals identified. will employ CBT, BA, Problem-solving, Solution Focused, Mindfulness,  coping skills, & other evidenced-based practices will be used to promote progress towards healthy functioning to help manage decrease symptoms associated with her diagnosis.   Reduce overall level, frequency, and intensity of the feelings of depression, anxiety and panic evidenced by decreased from 6 to 7 days/week to 0 to 1 days/week per client report for at least 3 consecutive months. Verbally express understanding of the relationship  between feelings of  depression, anxiety and their impact on thinking patterns and behaviors. Verbalize an understanding of the role that distorted thinking plays in creating fears, excessive worry, and ruminations.            Milinda participated in the creation of the treatment plan.  [name] MSW, LCSW    Interventions: Grief Therapy, CBT  Diagnosis: Major Depressive Disorder, recurrent,severe   Plan: Sheenia is to use CBT, mindfulness and coping skills to help manage decrease symptoms associated with their diagnosis.   Long-term goal:   [place] will reduce overall level, frequency, and intensity of the feelings of depression, anxiety and grief evidenced by decreased irritability, negative self talk, and helpless feelings from 6 to 7 days/week to 0 to 2 days/week per client report for at least 3 consecutive months.  Short-term goal:  Takira will verbally express understanding of the relationship between feelings of depression, anxiety and their impact on thinking patterns and behaviors. Verbalize an understanding of the role that distorted thinking plays in creating fears, excessive worry, and ruminations.  [name] MSW, LCSW DATE:01/27/2024

## 2023-01-27 NOTE — Telephone Encounter (Signed)
Called patient per Manson Passey and offered her an appointment with any provider.    Patient does states that "I do not want to see anyone else and will wait until Dr. Miquel Dunn is available at my next visit."  .Glennie Hawk, CMA

## 2023-01-28 ENCOUNTER — Ambulatory Visit (HOSPITAL_COMMUNITY)
Admission: RE | Admit: 2023-01-28 | Discharge: 2023-01-28 | Disposition: A | Discharge status: Home / Self Care | Payer: Medicare PPO | Source: Ambulatory Visit | Attending: Family Medicine | Admitting: Family Medicine

## 2023-01-28 DIAGNOSIS — J154 Pneumonia due to other streptococci: Secondary | ICD-10-CM | POA: Diagnosis not present

## 2023-01-28 DIAGNOSIS — R911 Solitary pulmonary nodule: Secondary | ICD-10-CM

## 2023-01-28 DIAGNOSIS — J90 Pleural effusion, not elsewhere classified: Secondary | ICD-10-CM | POA: Diagnosis not present

## 2023-01-28 MED ORDER — IOHEXOL 350 MG/ML SOLN
50.0000 mL | Freq: Once | INTRAVENOUS | Status: AC | PRN
  Administered 2023-01-28: 50 mL via INTRAVENOUS

## 2023-01-31 ENCOUNTER — Encounter (HOSPITAL_COMMUNITY): Payer: Self-pay

## 2023-01-31 ENCOUNTER — Other Ambulatory Visit: Payer: Self-pay

## 2023-01-31 ENCOUNTER — Telehealth: Payer: Self-pay | Admitting: Family Medicine

## 2023-01-31 ENCOUNTER — Emergency Department (HOSPITAL_COMMUNITY): Payer: Medicare PPO

## 2023-01-31 ENCOUNTER — Inpatient Hospital Stay (HOSPITAL_COMMUNITY)
Admission: EM | Admit: 2023-01-31 | Discharge: 2023-02-03 | DRG: 193 | Disposition: A | Discharge status: Home / Self Care | Payer: Medicare PPO | Attending: Internal Medicine | Admitting: Internal Medicine

## 2023-01-31 DIAGNOSIS — F17210 Nicotine dependence, cigarettes, uncomplicated: Secondary | ICD-10-CM | POA: Diagnosis present

## 2023-01-31 DIAGNOSIS — J918 Pleural effusion in other conditions classified elsewhere: Secondary | ICD-10-CM | POA: Diagnosis present

## 2023-01-31 DIAGNOSIS — Z8249 Family history of ischemic heart disease and other diseases of the circulatory system: Secondary | ICD-10-CM | POA: Diagnosis not present

## 2023-01-31 DIAGNOSIS — J440 Chronic obstructive pulmonary disease with acute lower respiratory infection: Secondary | ICD-10-CM | POA: Diagnosis present

## 2023-01-31 DIAGNOSIS — I480 Paroxysmal atrial fibrillation: Secondary | ICD-10-CM | POA: Diagnosis present

## 2023-01-31 DIAGNOSIS — Z7951 Long term (current) use of inhaled steroids: Secondary | ICD-10-CM

## 2023-01-31 DIAGNOSIS — K219 Gastro-esophageal reflux disease without esophagitis: Secondary | ICD-10-CM | POA: Diagnosis present

## 2023-01-31 DIAGNOSIS — F418 Other specified anxiety disorders: Secondary | ICD-10-CM | POA: Diagnosis present

## 2023-01-31 DIAGNOSIS — Z818 Family history of other mental and behavioral disorders: Secondary | ICD-10-CM

## 2023-01-31 DIAGNOSIS — Z1152 Encounter for screening for COVID-19: Secondary | ICD-10-CM

## 2023-01-31 DIAGNOSIS — J441 Chronic obstructive pulmonary disease with (acute) exacerbation: Secondary | ICD-10-CM | POA: Diagnosis present

## 2023-01-31 DIAGNOSIS — F419 Anxiety disorder, unspecified: Secondary | ICD-10-CM | POA: Diagnosis present

## 2023-01-31 DIAGNOSIS — I10 Essential (primary) hypertension: Secondary | ICD-10-CM | POA: Diagnosis present

## 2023-01-31 DIAGNOSIS — Z888 Allergy status to other drugs, medicaments and biological substances status: Secondary | ICD-10-CM | POA: Diagnosis not present

## 2023-01-31 DIAGNOSIS — Z820 Family history of epilepsy and other diseases of the nervous system: Secondary | ICD-10-CM

## 2023-01-31 DIAGNOSIS — J449 Chronic obstructive pulmonary disease, unspecified: Secondary | ICD-10-CM | POA: Diagnosis present

## 2023-01-31 DIAGNOSIS — J154 Pneumonia due to other streptococci: Secondary | ICD-10-CM | POA: Diagnosis present

## 2023-01-31 DIAGNOSIS — G629 Polyneuropathy, unspecified: Secondary | ICD-10-CM | POA: Diagnosis present

## 2023-01-31 DIAGNOSIS — Z83438 Family history of other disorder of lipoprotein metabolism and other lipidemia: Secondary | ICD-10-CM | POA: Diagnosis not present

## 2023-01-31 DIAGNOSIS — Z7901 Long term (current) use of anticoagulants: Secondary | ICD-10-CM | POA: Diagnosis not present

## 2023-01-31 DIAGNOSIS — J189 Pneumonia, unspecified organism: Secondary | ICD-10-CM | POA: Diagnosis present

## 2023-01-31 DIAGNOSIS — F32A Depression, unspecified: Secondary | ICD-10-CM | POA: Diagnosis present

## 2023-01-31 DIAGNOSIS — J9601 Acute respiratory failure with hypoxia: Secondary | ICD-10-CM | POA: Diagnosis present

## 2023-01-31 DIAGNOSIS — Z79899 Other long term (current) drug therapy: Secondary | ICD-10-CM

## 2023-01-31 DIAGNOSIS — R451 Restlessness and agitation: Secondary | ICD-10-CM | POA: Diagnosis not present

## 2023-01-31 DIAGNOSIS — B9789 Other viral agents as the cause of diseases classified elsewhere: Secondary | ICD-10-CM | POA: Diagnosis present

## 2023-01-31 DIAGNOSIS — J90 Pleural effusion, not elsewhere classified: Secondary | ICD-10-CM | POA: Diagnosis present

## 2023-01-31 DIAGNOSIS — Z720 Tobacco use: Secondary | ICD-10-CM | POA: Diagnosis present

## 2023-01-31 HISTORY — DX: Paroxysmal atrial fibrillation: I48.0

## 2023-01-31 LAB — EXPECTORATED SPUTUM ASSESSMENT W GRAM STAIN, RFLX TO RESP C

## 2023-01-31 LAB — CBC WITH DIFFERENTIAL/PLATELET
Abs Immature Granulocytes: 0.04 10*3/uL (ref 0.00–0.07)
Basophils Absolute: 0 10*3/uL (ref 0.0–0.1)
Basophils Relative: 0 %
Eosinophils Absolute: 0.1 10*3/uL (ref 0.0–0.5)
Eosinophils Relative: 1 %
HCT: 34.8 % — ABNORMAL LOW (ref 36.0–46.0)
Hemoglobin: 10.9 g/dL — ABNORMAL LOW (ref 12.0–15.0)
Immature Granulocytes: 1 %
Lymphocytes Relative: 15 %
Lymphs Abs: 1.2 10*3/uL (ref 0.7–4.0)
MCH: 25.5 pg — ABNORMAL LOW (ref 26.0–34.0)
MCHC: 31.3 g/dL (ref 30.0–36.0)
MCV: 81.3 fL (ref 80.0–100.0)
Monocytes Absolute: 0.5 10*3/uL (ref 0.1–1.0)
Monocytes Relative: 6 %
Neutro Abs: 6.4 10*3/uL (ref 1.7–7.7)
Neutrophils Relative %: 77 %
Platelets: 329 10*3/uL (ref 150–400)
RBC: 4.28 MIL/uL (ref 3.87–5.11)
RDW: 23.6 % — ABNORMAL HIGH (ref 11.5–15.5)
WBC: 8.3 10*3/uL (ref 4.0–10.5)
nRBC: 0 % (ref 0.0–0.2)

## 2023-01-31 LAB — TROPONIN I (HIGH SENSITIVITY)
Troponin I (High Sensitivity): 8 ng/L (ref <18)
Troponin I (High Sensitivity): 9 ng/L (ref <18)

## 2023-01-31 LAB — COMPREHENSIVE METABOLIC PANEL
ALT: 13 U/L (ref 0–44)
AST: 17 U/L (ref 15–41)
Albumin: 3.1 g/dL — ABNORMAL LOW (ref 3.5–5.0)
Alkaline Phosphatase: 174 U/L — ABNORMAL HIGH (ref 38–126)
Anion gap: 10 (ref 5–15)
BUN: <5 mg/dL — ABNORMAL LOW (ref 8–23)
CO2: 27 mmol/L (ref 22–32)
Calcium: 8.6 mg/dL — ABNORMAL LOW (ref 8.9–10.3)
Chloride: 97 mmol/L — ABNORMAL LOW (ref 98–111)
Creatinine, Ser: 0.67 mg/dL (ref 0.44–1.00)
GFR, Estimated: >60 mL/min (ref >60)
Glucose, Bld: 91 mg/dL (ref 70–99)
Potassium: 3.9 mmol/L (ref 3.5–5.1)
Sodium: 134 mmol/L — ABNORMAL LOW (ref 135–145)
Total Bilirubin: 0.3 mg/dL (ref 0.3–1.2)
Total Protein: 6.7 g/dL (ref 6.5–8.1)

## 2023-01-31 LAB — SARS CORONAVIRUS 2 BY RT PCR: SARS Coronavirus 2 by RT PCR: NEGATIVE

## 2023-01-31 LAB — BRAIN NATRIURETIC PEPTIDE: B Natriuretic Peptide: 240.7 pg/mL — ABNORMAL HIGH (ref 0.0–100.0)

## 2023-01-31 LAB — MAGNESIUM: Magnesium: 1.8 mg/dL (ref 1.7–2.4)

## 2023-01-31 MED ORDER — FAMOTIDINE 20 MG PO TABS
20.0000 mg | ORAL_TABLET | Freq: Once | ORAL | Status: AC
  Administered 2023-01-31: 20 mg via ORAL
  Filled 2023-01-31: qty 1

## 2023-01-31 MED ORDER — ESCITALOPRAM OXALATE 10 MG PO TABS
15.0000 mg | ORAL_TABLET | Freq: Every day | ORAL | Status: DC
  Administered 2023-02-01 – 2023-02-03 (×3): 15 mg via ORAL
  Filled 2023-01-31 (×3): qty 2

## 2023-01-31 MED ORDER — TRAZODONE HCL 50 MG PO TABS
50.0000 mg | ORAL_TABLET | Freq: Every evening | ORAL | Status: DC | PRN
  Administered 2023-01-31 – 2023-02-02 (×3): 50 mg via ORAL
  Filled 2023-01-31 (×3): qty 1

## 2023-01-31 MED ORDER — AMIODARONE HCL 200 MG PO TABS
200.0000 mg | ORAL_TABLET | Freq: Every day | ORAL | Status: DC
  Administered 2023-02-01 – 2023-02-03 (×3): 200 mg via ORAL
  Filled 2023-01-31 (×3): qty 1

## 2023-01-31 MED ORDER — METHYLPREDNISOLONE SODIUM SUCC 40 MG IJ SOLR
40.0000 mg | Freq: Two times a day (BID) | INTRAMUSCULAR | Status: DC
  Administered 2023-01-31 – 2023-02-02 (×4): 40 mg via INTRAVENOUS
  Filled 2023-01-31 (×4): qty 1

## 2023-01-31 MED ORDER — SODIUM CHLORIDE 0.9 % IV SOLN
2.0000 g | INTRAVENOUS | Status: DC
  Administered 2023-02-01 – 2023-02-02 (×2): 2 g via INTRAVENOUS
  Filled 2023-01-31 (×2): qty 20

## 2023-01-31 MED ORDER — SODIUM CHLORIDE 0.9% FLUSH
3.0000 mL | Freq: Two times a day (BID) | INTRAVENOUS | Status: DC
  Administered 2023-02-01 – 2023-02-02 (×4): 3 mL via INTRAVENOUS

## 2023-01-31 MED ORDER — CLONAZEPAM 0.5 MG PO TABS
0.5000 mg | ORAL_TABLET | Freq: Two times a day (BID) | ORAL | Status: DC | PRN
  Administered 2023-02-01 – 2023-02-03 (×5): 0.5 mg via ORAL
  Filled 2023-01-31 (×5): qty 1

## 2023-01-31 MED ORDER — BUSPIRONE HCL 5 MG PO TABS
5.0000 mg | ORAL_TABLET | Freq: Two times a day (BID) | ORAL | Status: DC
  Administered 2023-01-31 – 2023-02-03 (×6): 5 mg via ORAL
  Filled 2023-01-31 (×6): qty 1

## 2023-01-31 MED ORDER — IPRATROPIUM-ALBUTEROL 0.5-2.5 (3) MG/3ML IN SOLN: 3.0000 mL | Freq: Four times a day (QID) | RESPIRATORY_TRACT | Status: DC | PRN

## 2023-01-31 MED ORDER — SODIUM CHLORIDE 0.9 % IV SOLN
500.0000 mg | Freq: Once | INTRAVENOUS | Status: AC
  Administered 2023-01-31: 500 mg via INTRAVENOUS
  Filled 2023-01-31: qty 5

## 2023-01-31 MED ORDER — ONDANSETRON HCL 4 MG/2ML IJ SOLN
4.0000 mg | Freq: Once | INTRAMUSCULAR | Status: AC
  Administered 2023-01-31: 4 mg via INTRAVENOUS
  Filled 2023-01-31: qty 2

## 2023-01-31 MED ORDER — ARFORMOTEROL TARTRATE 15 MCG/2ML IN NEBU
15.0000 ug | INHALATION_SOLUTION | Freq: Two times a day (BID) | RESPIRATORY_TRACT | Status: DC
  Administered 2023-02-01 – 2023-02-03 (×6): 15 ug via RESPIRATORY_TRACT
  Filled 2023-01-31 (×6): qty 2

## 2023-01-31 MED ORDER — SODIUM CHLORIDE 0.9 % IV SOLN
500.0000 mg | INTRAVENOUS | Status: DC
  Administered 2023-02-01 – 2023-02-02 (×2): 500 mg via INTRAVENOUS
  Filled 2023-01-31 (×2): qty 5

## 2023-01-31 MED ORDER — GABAPENTIN 400 MG PO CAPS
800.0000 mg | ORAL_CAPSULE | Freq: Three times a day (TID) | ORAL | Status: DC
  Administered 2023-01-31 – 2023-02-03 (×8): 800 mg via ORAL
  Filled 2023-01-31 (×8): qty 2

## 2023-01-31 MED ORDER — ONDANSETRON HCL 4 MG PO TABS
4.0000 mg | ORAL_TABLET | Freq: Four times a day (QID) | ORAL | Status: DC | PRN
  Administered 2023-02-02: 4 mg via ORAL
  Filled 2023-01-31: qty 1

## 2023-01-31 MED ORDER — HYDROXYZINE HCL 25 MG PO TABS
25.0000 mg | ORAL_TABLET | Freq: Three times a day (TID) | ORAL | Status: DC | PRN
  Administered 2023-02-01 – 2023-02-02 (×3): 25 mg via ORAL
  Filled 2023-01-31 (×4): qty 1

## 2023-01-31 MED ORDER — GUAIFENESIN ER 600 MG PO TB12
600.0000 mg | ORAL_TABLET | Freq: Two times a day (BID) | ORAL | Status: DC
  Administered 2023-01-31 – 2023-02-03 (×6): 600 mg via ORAL
  Filled 2023-01-31 (×6): qty 1

## 2023-01-31 MED ORDER — SENNOSIDES-DOCUSATE SODIUM 8.6-50 MG PO TABS: 1.0000 | ORAL_TABLET | Freq: Every evening | ORAL | Status: DC | PRN

## 2023-01-31 MED ORDER — ACETAMINOPHEN 650 MG RE SUPP: 650.0000 mg | Freq: Four times a day (QID) | RECTAL | Status: DC | PRN

## 2023-01-31 MED ORDER — ACETAMINOPHEN 325 MG PO TABS
650.0000 mg | ORAL_TABLET | Freq: Four times a day (QID) | ORAL | Status: DC | PRN
  Administered 2023-01-31 – 2023-02-03 (×6): 650 mg via ORAL
  Filled 2023-01-31 (×7): qty 2

## 2023-01-31 MED ORDER — ONDANSETRON HCL 4 MG/2ML IJ SOLN
4.0000 mg | Freq: Four times a day (QID) | INTRAMUSCULAR | Status: DC | PRN
  Administered 2023-02-02: 4 mg via INTRAVENOUS
  Filled 2023-01-31: qty 2

## 2023-01-31 MED ORDER — NICOTINE 21 MG/24HR TD PT24
21.0000 mg | MEDICATED_PATCH | Freq: Every day | TRANSDERMAL | Status: DC
  Administered 2023-01-31 – 2023-02-03 (×4): 21 mg via TRANSDERMAL
  Filled 2023-01-31 (×4): qty 1

## 2023-01-31 MED ORDER — SODIUM CHLORIDE 0.9 % IV SOLN
1.0000 g | Freq: Once | INTRAVENOUS | Status: AC
  Administered 2023-01-31: 1 g via INTRAVENOUS
  Filled 2023-01-31: qty 10

## 2023-01-31 MED ORDER — BUDESONIDE 0.25 MG/2ML IN SUSP
0.2500 mg | Freq: Two times a day (BID) | RESPIRATORY_TRACT | Status: DC
  Administered 2023-02-01 – 2023-02-03 (×6): 0.25 mg via RESPIRATORY_TRACT
  Filled 2023-01-31 (×6): qty 2

## 2023-01-31 MED ORDER — PANTOPRAZOLE SODIUM 40 MG PO TBEC
40.0000 mg | DELAYED_RELEASE_TABLET | Freq: Every day | ORAL | Status: DC
  Administered 2023-02-01 – 2023-02-03 (×3): 40 mg via ORAL
  Filled 2023-01-31 (×3): qty 1

## 2023-01-31 NOTE — Telephone Encounter (Signed)
Called patient to discuss recent CT. Official read pending but large R sided pleural effusion noted. Karen Casey notes she has been coughing up yellow phlegm for past few days and Also having some diarrhea and feeling overall weak. She is concerned about the lung findings and wondering if it can be related to cancer. I explained that fluid in the lungs can be from a variety of things including infection and also possibly cancer, and that we need to drain the fluid from her lung and test it to know where it came from. I discussed my recommendation that she go to the emergency room to be evaluated and that I suspect she will be admitted for possible pneumonia and large pleural effusion that needs thoracentesis to rule out malignancy. She is in agreement and states she will call 911 and proceed to the ED.  She notes she will likely go to [facility] due to shorter ED wait times. Discussed if she goes to [facility] please admit under the [facility] Medicine Teaching Service.  Burley Saver MD

## 2023-01-31 NOTE — ED Provider Notes (Signed)
[place] EMERGENCY DEPARTMENT AT [facility] Provider Note   CSN: [id number] Arrival date & time: 01/31/23  1758     History Chief Complaint  Patient presents with   Shortness of Breath    HPI MYNA VAUGHN is a 67 y.o. female presenting for fever and cough and back pain x 5 days. She states that she is having cough and fatigue and SOB. Endorses fevers and cough this week.  CT by PCP on Friday showed right sided effusion. Told to come to hospital for CHF vs PNA eval.   Patient's recorded medical, surgical, social, medication list and allergies were reviewed in the Snapshot window as part of the initial history.   Review of Systems   Review of Systems  Constitutional:  Positive for chills, fatigue and fever.  HENT:  Positive for congestion. Negative for ear pain and sore throat.   Eyes:  Negative for pain and visual disturbance.  Respiratory:  Positive for cough and shortness of breath.   Cardiovascular:  Negative for chest pain and palpitations.  Gastrointestinal:  Negative for abdominal pain and vomiting.  Genitourinary:  Negative for dysuria and hematuria.  Musculoskeletal:  Negative for arthralgias and back pain.  Skin:  Negative for color change and rash.  Neurological:  Positive for weakness. Negative for seizures and syncope.  All other systems reviewed and are negative.   Physical Exam Updated Vital Signs BP 129/63 (BP Location: Left Arm)   Pulse 89   Temp 98.9 F (37.2 C) (Oral)   Resp 18   Ht 5\' 4"  (1.626 m)   Wt 68 kg   SpO2 98%   BMI 25.73 kg/m  Physical Exam Vitals and nursing note reviewed.  Constitutional:      General: She is not in acute distress.    Appearance: She is well-developed.  HENT:     Head: Normocephalic and atraumatic.  Eyes:     Conjunctiva/sclera: Conjunctivae normal.  Cardiovascular:     Rate and Rhythm: Normal rate and regular rhythm.     Heart sounds: No murmur heard. Pulmonary:     Effort: Pulmonary effort  is normal. Tachypnea present. No respiratory distress.     Breath sounds: Rhonchi present.  Abdominal:     General: There is no distension.     Palpations: Abdomen is soft.     Tenderness: There is no abdominal tenderness. There is no right CVA tenderness or left CVA tenderness.  Musculoskeletal:        General: No swelling or tenderness. Normal range of motion.     Cervical back: Neck supple.  Skin:    General: Skin is warm and dry.  Neurological:     General: No focal deficit present.     Mental Status: She is alert and oriented to person, place, and time. Mental status is at baseline.     Cranial Nerves: No cranial nerve deficit.      ED Course/ Medical Decision Making/ A&P    Procedures Procedures   Medications Ordered in ED Medications  cefTRIAXone (ROCEPHIN) 2 g in sodium chloride 0.9 % 100 mL IVPB (has no administration in time range)  azithromycin (ZITHROMAX) 500 mg in sodium chloride 0.9 % 250 mL IVPB (has no administration in time range)  sodium chloride flush (NS) 0.9 % injection 3 mL (has no administration in time range)  methylPREDNISolone sodium succinate (SOLU-MEDROL) 40 mg/mL injection 40 mg (has no administration in time range)  ipratropium-albuterol (DUONEB) 0.5-2.5 (3) MG/3ML nebulizer solution  3 mL (has no administration in time range)  arformoterol (BROVANA) nebulizer solution 15 mcg (has no administration in time range)  budesonide (PULMICORT) nebulizer solution 0.25 mg (has no administration in time range)  acetaminophen (TYLENOL) tablet 650 mg (has no administration in time range)    Or  acetaminophen (TYLENOL) suppository 650 mg (has no administration in time range)  ondansetron (ZOFRAN) tablet 4 mg (has no administration in time range)    Or  ondansetron (ZOFRAN) injection 4 mg (has no administration in time range)  senna-docusate (Senokot-S) tablet 1 tablet (has no administration in time range)  guaiFENesin (MUCINEX) 12 hr tablet 600 mg (has no  administration in time range)  nicotine (NICODERM CQ - dosed in mg/24 hours) patch 21 mg (has no administration in time range)  amiodarone (PACERONE) tablet 200 mg (has no administration in time range)  busPIRone (BUSPAR) tablet 5 mg (has no administration in time range)  clonazePAM (KLONOPIN) tablet 0.5 mg (has no administration in time range)  escitalopram (LEXAPRO) tablet 15 mg (has no administration in time range)  gabapentin (NEURONTIN) capsule 800 mg (has no administration in time range)  hydrOXYzine (ATARAX) tablet 25 mg (has no administration in time range)  pantoprazole (PROTONIX) EC tablet 40 mg (has no administration in time range)  traZODone (DESYREL) tablet 50 mg (has no administration in time range)  cefTRIAXone (ROCEPHIN) 1 g in sodium chloride 0.9 % 100 mL IVPB (0 g Intravenous Stopped 01/31/23 2109)  azithromycin (ZITHROMAX) 500 mg in sodium chloride 0.9 % 250 mL IVPB (500 mg Intravenous New Bag/Given 01/31/23 2124)  ondansetron (ZOFRAN) injection 4 mg (4 mg Intravenous Given 01/31/23 2039)  famotidine (PEPCID) tablet 20 mg (20 mg Oral Given 01/31/23 2039)    Medical Decision Making:    JANEI LILLA is a 67 y.o. female who presented to the ED today with SOB and fever detailed above.     Patient placed on continuous vitals and telemetry monitoring while in ED which was reviewed periodically.   Complete initial physical exam performed, notably the patient  was quite short of breath but no acute distress.      Reviewed and confirmed nursing documentation for past medical history, family history, social history.    Initial Assessment:   With the patient's presentation of fever and cough, most likely diagnosis is CAP. Other diagnoses were considered including (but not limited to) CHF vs ACS vs PE. These are considered less likely due to history of present illness and physical exam findings.   This is most consistent with an acute life/limb threatening illness complicated by  underlying chronic conditions.  Initial Plan:  Screening labs including CBC and Metabolic panel to evaluate for infectious or metabolic etiology of disease.  Urinalysis with reflex culture ordered to evaluate for UTI or relevant urologic/nephrologic pathology.  CXR to evaluate for structural/infectious intrathoracic pathology.  Troponin/EKG to evaluate for cardiac pathology. Objective evaluation as below reviewed with plan for close reassessment  Initial Study Results:   Laboratory  All laboratory results reviewed without evidence of clinically relevant pathology.    EKG EKG was reviewed independently. Rate, rhythm, axis, intervals all examined and without medically relevant abnormality. ST segments without concerns for elevations.    Radiology  All images reviewed independently. Agree with radiology report at this time.   DG Chest Port 1 View  Result Date: 01/31/2023 CLINICAL DATA:  Shortness of breath EXAM: PORTABLE CHEST 1 VIEW COMPARISON:  Chest x-ray 07/26/2022 FINDINGS: There is haziness overlying the lower  right lung which may represent pleural effusion. There is no lung consolidation or pneumothorax. The cardiomediastinal silhouette is within normal limits. IMPRESSION: Haziness overlying the lower right lung which may represent pleural effusion. Electronically Signed   By: Darliss Cheney M.D.   On: 01/31/2023 19:33     Consults:  Case discussed with hospitalist.   Reassessment and Plan:   Patient with right-sided pulmonary consolidation on both CT and x-ray.  Legrand Rams that this is gena be infectious rather than heart failure but given the left-sided involvement on the CT there may be underlying heart failure. BNP is mildly elevated.  Consulted hospitalist who agreed with need for admission.  Likely pneumonia treated with antibiotics cultures performed.   Disposition:   Based on the above findings, I believe this patient is stable for admission.    Patient/family educated about  specific findings on our evaluation and explained exact reasons for admission.  Patient/family educated about clinical situation and time was allowed to answer questions.   Admission team communicated with and agreed with need for admission. Patient admitted. Patient ready to move at this time.     Emergency Department Medication Summary:   Medications  cefTRIAXone (ROCEPHIN) 2 g in sodium chloride 0.9 % 100 mL IVPB (has no administration in time range)  azithromycin (ZITHROMAX) 500 mg in sodium chloride 0.9 % 250 mL IVPB (has no administration in time range)  sodium chloride flush (NS) 0.9 % injection 3 mL (has no administration in time range)  methylPREDNISolone sodium succinate (SOLU-MEDROL) 40 mg/mL injection 40 mg (has no administration in time range)  ipratropium-albuterol (DUONEB) 0.5-2.5 (3) MG/3ML nebulizer solution 3 mL (has no administration in time range)  arformoterol (BROVANA) nebulizer solution 15 mcg (has no administration in time range)  budesonide (PULMICORT) nebulizer solution 0.25 mg (has no administration in time range)  acetaminophen (TYLENOL) tablet 650 mg (has no administration in time range)    Or  acetaminophen (TYLENOL) suppository 650 mg (has no administration in time range)  ondansetron (ZOFRAN) tablet 4 mg (has no administration in time range)    Or  ondansetron (ZOFRAN) injection 4 mg (has no administration in time range)  senna-docusate (Senokot-S) tablet 1 tablet (has no administration in time range)  guaiFENesin (MUCINEX) 12 hr tablet 600 mg (has no administration in time range)  nicotine (NICODERM CQ - dosed in mg/24 hours) patch 21 mg (has no administration in time range)  amiodarone (PACERONE) tablet 200 mg (has no administration in time range)  busPIRone (BUSPAR) tablet 5 mg (has no administration in time range)  clonazePAM (KLONOPIN) tablet 0.5 mg (has no administration in time range)  escitalopram (LEXAPRO) tablet 15 mg (has no administration in time  range)  gabapentin (NEURONTIN) capsule 800 mg (has no administration in time range)  hydrOXYzine (ATARAX) tablet 25 mg (has no administration in time range)  pantoprazole (PROTONIX) EC tablet 40 mg (has no administration in time range)  traZODone (DESYREL) tablet 50 mg (has no administration in time range)  cefTRIAXone (ROCEPHIN) 1 g in sodium chloride 0.9 % 100 mL IVPB (0 g Intravenous Stopped 01/31/23 2109)  azithromycin (ZITHROMAX) 500 mg in sodium chloride 0.9 % 250 mL IVPB (500 mg Intravenous New Bag/Given 01/31/23 2124)  ondansetron (ZOFRAN) injection 4 mg (4 mg Intravenous Given 01/31/23 2039)  famotidine (PEPCID) tablet 20 mg (20 mg Oral Given 01/31/23 2039)      Clinical Impression:  1. Pleural effusion      Admit   Final Clinical Impression(s) / ED Diagnoses  Final diagnoses:  Pleural effusion    Rx / DC Orders ED Discharge Orders     None         Glyn Ade, MD 01/31/23 2313

## 2023-01-31 NOTE — Plan of Care (Signed)
?  Problem: Respiratory: ?Goal: Levels of oxygenation will improve ?Outcome: Progressing ?Goal: Ability to maintain adequate ventilation will improve ?Outcome: Progressing ?  ?

## 2023-01-31 NOTE — Hospital Course (Signed)
Karen Casey is a 67 y.o. female with medical history significant for COPD, PAF on Eliquis, depression/anxiety, tobacco use who is admitted with acute hypoxic respiratory failure due to multifocal pneumonia associated with right pleural effusion.

## 2023-01-31 NOTE — H&P (Signed)
History and Physical    Karen Casey QMV:[id number] DOB: [birth year] DOA: 01/31/2023  PCP: Billey Co, MD  Patient coming from: Home  I have personally briefly reviewed patient's old medical records in [facility]  Chief Complaint: Shortness of breath  HPI: Karen Casey is a 67 y.o. female with medical history significant for COPD, PAF on Eliquis, depression/anxiety, tobacco use who presented to the ED for evaluation of shortness of breath.  Patient states about 10 days ago she began to feel fatigued, short of breath, nauseous.  She was having fevers, chills, and diaphoresis.  She reports worsening cough from baseline with new yellow sputum production.  She was concerned that she had the COVID infection.  After a few days she began to feel better however symptoms returned and have been worsening.  She has been feeling herself wheezing.  She is generally weak with poor appetite.  She says today she developed some loose stools.  She says she does not require supplemental oxygen at baseline.  She reports adherence to Eliquis with last dose taken 8/19 AM.  She reports continued tobacco use, smoking about 0.5 PPD.  Outpatient CT chest with contrast performed 01/28/2023 was completed.  Official read is pending but per my review she does have a right-sided pleural effusion as well as areas of multifocal patchy infiltrates bilateral lung fields.  ED Course  Labs/Imaging on admission: I have personally reviewed following labs and imaging studies.  Initial vitals showed BP 151/84, pulse 85, RR 18, temp 99.0 F, SpO2 98% on room air.  SpO2 dropped to 85% on room air and patient was placed on 2 L via [place].  Labs show WBC 8.3, hemoglobin 10.9, platelets 329,000, sodium 134, potassium 3.9, bicarb 27, BUN <5, creatinine 0.67, serum glucose 91, AST 17, ALT 13, alk phos 174, total bilirubin 0.3, troponin 8, magnesium 1.8, BNP 240.7.  SARS-CoV-2 PCR is negative.  Blood cultures and  expectorated sputum in process.  Portable chest x-ray shows haziness over the right lower lung.  Patient was given IV ceftriaxone and azithromycin.  The hospitalist service was consulted to admit for further evaluation and management.  Review of Systems: All systems reviewed and are negative except as documented in history of present illness above.   Past Medical History:  Diagnosis Date   Alcohol abuse    last use 03/09/21, marijuana last 03/09/21   Allergy    Anxiety    Cataract 06/09/2012   Right eye and left eye   COPD (chronic obstructive pulmonary disease) (HCC)    Depression    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/04/2020   Due to benzodiazepine withdrawal   Dyspnea    uses oxygen 2L via  prn   Enterococcal bacteremia 02/10/2022   GERD (gastroesophageal reflux disease)    Headache    Hypertension    Long term (current) use of anticoagulants    Nausea vomiting and diarrhea 02/09/2022   Neuromuscular disorder (HCC)    Neuropathy    Paroxysmal atrial fibrillation (HCC)    Positive blood culture 02/10/2022   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   Seizure (HCC)    08/2020 per patient   Seizures (HCC)    xanax withdrawl- December 2013   Urinary incontinence 06/09/2012    Past Surgical History:  Procedure Laterality Date   APPENDECTOMY     BIOPSY  01/04/2021   Procedure: BIOPSY;  Surgeon: Benancio Deeds, MD;  Location: [facility] ENDOSCOPY;  Service: Gastroenterology;;   BIOPSY  03/12/2021   Procedure: BIOPSY;  Surgeon: Lemar Lofty., MD;  Location: [facility] ENDOSCOPY;  Service: Gastroenterology;;   CARDIOVERSION N/A 01/28/2022   Procedure: CARDIOVERSION;  Surgeon: Yates Decamp, MD;  Location: [facility] ENDOSCOPY;  Service: Cardiovascular;  Laterality: N/A;   CATARACT EXTRACTION  06/09/2012   Left eye   COLONOSCOPY WITH PROPOFOL N/A 03/12/2021   Procedure: COLONOSCOPY WITH PROPOFOL;  Surgeon: Lemar Lofty., MD;  Location: [facility]  ENDOSCOPY;  Service: Gastroenterology;  Laterality: N/A;   ESOPHAGOGASTRODUODENOSCOPY (EGD) WITH PROPOFOL N/A 01/04/2021   Procedure: ESOPHAGOGASTRODUODENOSCOPY (EGD) WITH PROPOFOL;  Surgeon: Benancio Deeds, MD;  Location: [facility] ENDOSCOPY;  Service: Gastroenterology;  Laterality: N/A;   left shoulder dislocation  Sept 2011   POLYPECTOMY  03/12/2021   Procedure: POLYPECTOMY;  Surgeon: Mansouraty, Netty Starring., MD;  Location: [facility] ENDOSCOPY;  Service: Gastroenterology;;    Social History:  reports that she has been smoking cigarettes. She has a 21.5 pack-year smoking history. She has never used smokeless tobacco. She reports current alcohol use of about 3.0 standard drinks of alcohol per week. She reports current drug use. Drugs: Marijuana and Other-see comments.  Allergies  Allergen Reactions   Capsaicin-Menthol Rash and Other (See Comments)    Burning and peeling on area applied to   Diclo Gel [Diclofenac Sodium] Rash and Other (See Comments)    Burning and peeling at the sight of application.    Family History  Problem Relation Age of Onset   Hypertension Mother    Hyperlipidemia Mother    Aneurysm Mother        Rupture - Cause of death   Heart disease Father        MI - cause of death   Depression Father    Parkinsonism Father    Hypertension Sister    Colon cancer Neg Hx    Esophageal cancer Neg Hx    Rectal cancer Neg Hx    Stomach cancer Neg Hx      Prior to Admission medications   Medication Sig Start Date End Date Taking? Authorizing Provider  albuterol (VENTOLIN HFA) 108 (90 Base) MCG/ACT inhaler Inhale 2 puffs into the lungs every 6 (six) hours as needed for wheezing or shortness of breath. 10/29/22   Billey Co, MD  amiodarone (PACERONE) 200 MG tablet Take 1 tablet (200 mg total) by mouth daily. 12/06/22   Lewanda Rife, MD  amitriptyline (ELAVIL) 10 MG tablet Take 1 tablet (10 mg total) by mouth at bedtime. Patient not taking: Reported on 01/27/2023 12/13/22    Billey Co, MD  apixaban (ELIQUIS) 5 MG TABS tablet Take 1 tablet (5 mg total) by mouth 2 (two) times daily. 12/06/22   Lewanda Rife, MD  clonazePAM (KLONOPIN) 0.5 MG tablet Take 1 tablet (0.5 mg total) by mouth 2 (two) times daily as needed for anxiety. Ok to fill 12/29/22 12/28/22   Billey Co, MD  escitalopram (LEXAPRO) 5 MG tablet Take 3 tablets (15 mg total) by mouth daily. 01/03/23   Billey Co, MD  ferrous sulfate 325 (65 FE) MG tablet Take 1 tablet (325 mg total) by mouth every other day. 12/06/22   Lewanda Rife, MD  Fluticasone-Umeclidin-Vilant (TRELEGY ELLIPTA) 100-62.5-25 MCG/ACT AEPB Inhale 1 puff into the lungs daily. 10/29/22   Billey Co, MD  gabapentin (NEURONTIN) 400 MG capsule Take 2 capsules (800 mg total) by mouth 3 (three) times daily. 12/29/22   Billey Co, MD  hydrOXYzine (ATARAX) 25  MG tablet Take 1 tablet (25 mg total) by mouth 3 (three) times daily as needed for anxiety. 12/06/22   Lewanda Rife, MD  lidocaine (LIDODERM) 5 % Place 1 patch onto the skin daily. Remove & Discard patch within 12 hours or as directed by MD Patient not taking: Reported on 01/27/2023 12/13/22   Billey Co, MD  pantoprazole (PROTONIX) 40 MG tablet Take 1 tablet (40 mg total) by mouth daily. 12/06/22   Lewanda Rife, MD  traZODone (DESYREL) 50 MG tablet Take 1 tablet (50 mg total) by mouth at bedtime as needed for sleep. 12/06/22   Lewanda Rife, MD  Vitamin D, Ergocalciferol, (DRISDOL) 1.25 MG (50000 UNIT) CAPS capsule Take 1 capsule (50,000 Units total) by mouth every 7 (seven) days. 12/12/22   Lewanda Rife, MD  hydrOXYzine (ATARAX) 10 MG tablet TAKE 1 TABLET BY MOUTH THREE TIMES A DAY AS NEEDED FOR ANXIETY 10/15/22   Billey Co, MD    Physical Exam: Vitals:   01/31/23 1808 01/31/23 1809 01/31/23 2110 01/31/23 2202  BP: (!) 151/84  129/63   Pulse: 85  89   Resp: 18  18   Temp: 99 F (37.2 C)   98.9 F (37.2 C)  TempSrc: Oral   Oral   SpO2: 98%  98%   Weight:  68 kg    Height:  5\' 4"  (1.626 m)     Constitutional: Resting in bed, appears fatigued but in NAD, calm, comfortable Eyes: EOMI, lids and conjunctivae normal ENMT: Mucous membranes are moist. Posterior pharynx clear of any exudate or lesions.Normal dentition.  Neck: normal, supple, no masses. Respiratory: Coarse expiratory wheezing throughout the lung fields with decreased breath sounds right lower lung field.  Normal respiratory effort while on 2 L O2 via [place]. No accessory muscle use.  Cardiovascular: Regular rate and rhythm, no murmurs / rubs / gallops. No extremity edema. 2+ pedal pulses. Abdomen: no tenderness, no masses palpated. Musculoskeletal: no clubbing / cyanosis. No joint deformity upper and lower extremities. Good ROM, no contractures. Normal muscle tone.  Skin: no rashes, lesions, ulcers. No induration Neurologic: Sensation intact. Strength 5/5 in all 4.  Psychiatric: Alert and oriented x 3. Normal mood.   EKG: Personally reviewed. Sinus rhythm, rate 89, no acute ischemic changes.  Assessment/Plan Principal Problem:   Acute respiratory failure with hypoxia (HCC) Active Problems:   Tobacco use   Multifocal pneumonia   Pleural effusion on right   COPD with acute exacerbation (HCC)   Paroxysmal atrial fibrillation (HCC)   Depression with anxiety   Karen Casey is a 67 y.o. female with medical history significant for COPD, PAF on Eliquis, depression/anxiety, tobacco use who is admitted with acute hypoxic respiratory failure due to multifocal pneumonia associated with right pleural effusion.  Assessment and Plan: Multifocal pneumonia with right pleural effusion: Outpatient CT chest with right pleural effusion and areas of multifocal patchy infiltrates.  She is on empiric antibiotics however this may be a viral process.  Of note she is on chronic amiodarone however suspect pulmonary findings are likely infectious related. -Continue IV  ceftriaxone and azithromycin -SARS-CoV-2 PCR negative, obtain full respiratory pathogen panel -Follow sputum culture, strep pneumonia and Legionella urinary antigens -Diagnostic thoracentesis with labs ordered  COPD with acute exacerbation: Significant wheezing throughout the lungs on admission.  Exacerbation likely triggered by acute infection. -Continue antibiotics as above -IV Solu-Medrol 40 mg twice daily -Scheduled Brovana/Pulmicort twice daily, DuoNebs as needed -IS, FV, Mucinex  Acute respiratory failure with hypoxia:  Secondary to COPD exacerbation and pneumonia.  SpO2 result was 85% on RA while in the ED.  Currently stable on 2 L via [place].  She does not require supplemental O2 at her baseline. -Continue management as above and wean supplemental to as able  Paroxysmal atrial fibrillation: In sinus rhythm on admission.  Continue amiodarone.  Hold Eliquis pending thoracentesis.  Depression/anxiety: Continue BuSpar, Lexapro, trazodone, and clonazepam as needed.  Neuropathy: Continue home gabapentin.  Tobacco use: Patient reports smoking 0.5 PPD.  Nicotine patch provided.   DVT prophylaxis: SCDs Start: 01/31/23 2201 Code Status: Full code, confirmed with patient on admission Family Communication: Discussed with patient, she has discussed with family Disposition Plan: From home and likely discharge to home pending clinical progress Consults called: None Severity of Illness: The appropriate patient status for this patient is INPATIENT. Inpatient status is judged to be reasonable and necessary in order to provide the required intensity of service to ensure the patient's safety. The patient's presenting symptoms, physical exam findings, and initial radiographic and laboratory data in the context of their chronic comorbidities is felt to place them at high risk for further clinical deterioration. Furthermore, it is not anticipated that the patient will be medically stable for discharge  from the hospital within 2 midnights of admission.   * I certify that at the point of admission it is my clinical judgment that the patient will require inpatient hospital care spanning beyond 2 midnights from the point of admission due to high intensity of service, high risk for further deterioration and high frequency of surveillance required.Darreld Mclean MD [facility]  If 7PM-7AM, please contact night-coverage [email or web address]  01/31/2023, 10:13 PM

## 2023-01-31 NOTE — ED Triage Notes (Addendum)
Patient BIB [facility] from home. Her primary care doctor told her she has fluid on her right lung. Wheezing in upper lobes, diminished right lower. Has had diarrhea for [redacted] weeks along with congestion. Feeling weak with no appetite.

## 2023-02-01 ENCOUNTER — Inpatient Hospital Stay (HOSPITAL_COMMUNITY): Payer: Medicare PPO

## 2023-02-01 DIAGNOSIS — J9601 Acute respiratory failure with hypoxia: Secondary | ICD-10-CM | POA: Diagnosis not present

## 2023-02-01 LAB — COMPREHENSIVE METABOLIC PANEL
ALT: 12 U/L (ref 0–44)
AST: 16 U/L (ref 15–41)
Albumin: 2.7 g/dL — ABNORMAL LOW (ref 3.5–5.0)
Alkaline Phosphatase: 139 U/L — ABNORMAL HIGH (ref 38–126)
Anion gap: 9 (ref 5–15)
BUN: <5 mg/dL — ABNORMAL LOW (ref 8–23)
CO2: 26 mmol/L (ref 22–32)
Calcium: 8 mg/dL — ABNORMAL LOW (ref 8.9–10.3)
Chloride: 100 mmol/L (ref 98–111)
Creatinine, Ser: 0.74 mg/dL (ref 0.44–1.00)
GFR, Estimated: >60 mL/min (ref >60)
Glucose, Bld: 180 mg/dL — ABNORMAL HIGH (ref 70–99)
Potassium: 4.7 mmol/L (ref 3.5–5.1)
Sodium: 135 mmol/L (ref 135–145)
Total Bilirubin: 0.3 mg/dL (ref 0.3–1.2)
Total Protein: 6.1 g/dL — ABNORMAL LOW (ref 6.5–8.1)

## 2023-02-01 LAB — BODY FLUID CELL COUNT WITH DIFFERENTIAL
Eos, Fluid: 12 %
Lymphs, Fluid: 67 %
Monocyte-Macrophage-Serous Fluid: 14 % — ABNORMAL LOW (ref 50–90)
Neutrophil Count, Fluid: 7 % (ref 0–25)
Total Nucleated Cell Count, Fluid: 611 cu mm (ref 0–1000)

## 2023-02-01 LAB — CBC
HCT: 32.5 % — ABNORMAL LOW (ref 36.0–46.0)
Hemoglobin: 10.1 g/dL — ABNORMAL LOW (ref 12.0–15.0)
MCH: 25.9 pg — ABNORMAL LOW (ref 26.0–34.0)
MCHC: 31.1 g/dL (ref 30.0–36.0)
MCV: 83.3 fL (ref 80.0–100.0)
Platelets: 278 10*3/uL (ref 150–400)
RBC: 3.9 MIL/uL (ref 3.87–5.11)
RDW: 23.9 % — ABNORMAL HIGH (ref 11.5–15.5)
WBC: 7.4 10*3/uL (ref 4.0–10.5)
nRBC: 0 % (ref 0.0–0.2)

## 2023-02-01 LAB — RESPIRATORY PANEL BY PCR

## 2023-02-01 LAB — STREP PNEUMONIAE URINARY ANTIGEN: Strep Pneumo Urinary Antigen: POSITIVE — AB

## 2023-02-01 LAB — LACTATE DEHYDROGENASE: LDH: 144 U/L (ref 98–192)

## 2023-02-01 LAB — PROTEIN, PLEURAL OR PERITONEAL FLUID: Total protein, fluid: 3.3 g/dL

## 2023-02-01 LAB — GLUCOSE, PLEURAL OR PERITONEAL FLUID: Glucose, Fluid: 135 mg/dL

## 2023-02-01 LAB — PROCALCITONIN: Procalcitonin: 9.3 ng/mL

## 2023-02-01 LAB — HIV ANTIBODY (ROUTINE TESTING W REFLEX): HIV Screen 4th Generation wRfx: NONREACTIVE

## 2023-02-01 MED ORDER — LOPERAMIDE HCL 2 MG PO CAPS
2.0000 mg | ORAL_CAPSULE | ORAL | Status: DC | PRN
  Administered 2023-02-01: 2 mg via ORAL
  Filled 2023-02-01: qty 1

## 2023-02-01 MED ORDER — LIDOCAINE HCL 1 % IJ SOLN
INTRAMUSCULAR | Status: AC
  Filled 2023-02-01: qty 20

## 2023-02-01 NOTE — Progress Notes (Signed)
PROGRESS NOTE    Karen Casey  UEA:[id number] DOB: 1955-12-21 DOA: 01/31/2023 PCP: Billey Co, MD    Brief Narrative:  67 year old with history of COPD, paroxysmal A-fib on Eliquis, depression, anxiety and smoker presented to the ER for evaluation of shortness of breath for about 10 days.  She has been been having symptoms of fatigue, shortness of breath and nausea, fever, chills and diaphoresis.  Worsening cough from baseline.  She was seen at primary care physician's office.  CT scan done on 8/16 did show right-sided pleural effusion and multifocal patchy infiltrates bilateral lung fields so she was referred to ER.  In the emergency room hemodynamically stable.  85% on room air on mobility.  Placed on 2 L oxygen.  WBC count normal.  COVID-19 negative.  Portable chest x-ray shows haziness over the right lower lung.  Given IV antibiotics and admitted to the hospital.   Assessment & Plan:   Multifocal pneumonia with right-sided pleural effusion, suspect parapneumonic effusion: Antibiotics to treat bacterial pneumonia, continue Rocephin and azithromycin. Chest physiotherapy, incentive spirometry, deep breathing exercises, sputum induction, mucolytic's and bronchodilators. Sputum cultures, blood cultures, Legionella and streptococcal antigen. Supplemental oxygen to keep saturations more than 90%. Diagnostic and therapeutic thoracentesis ordered through IR. Will need cultures and fluid analysis. Respiratory panel positive for rhinovirus, patient probably developed postviral pneumonia.  COPD with acute exacerbation with hypoxia: Antibiotics as above.  Chest physiotherapy as above.  IV Solu-Medrol, Brovana and Pulmicort and nebulizer.  Chronic medical issues including Paroxysmal A-fib, currently sinus rhythm.  On amiodarone.  Holding Eliquis for procedure. Depression and anxiety, on multiple medications including BuSpar, Lexapro, trazodone and Klonopin. Neuropathy, on  gabapentin Smoker, nicotine patch.  Counseled to quit.   DVT prophylaxis: SCDs Start: 01/31/23 2201   Code Status: Full code Family Communication: None at bedside Disposition Plan: Status is: Inpatient Remains inpatient appropriate because: IV antibiotics.  Inpatient procedures.     Consultants:  None  Procedures:  Thoracentesis right  Antimicrobials:  Rocephin azithromycin 8/19---   Subjective: Patient seen and examined.  She has some pleuritic chest pain otherwise denies any other complaints.  Remains afebrile.  Anxious about the procedure.  Objective: Vitals:   02/01/23 0751 02/01/23 0819 02/01/23 1237 02/01/23 1338  BP:  (!) 145/82 130/67 (!) 147/73  Pulse:  86  69  Resp:  (!) 24  20  Temp:  97.9 F (36.6 C)  98.1 F (36.7 C)  TempSrc:  Oral  Oral  SpO2: 95% 98%  95%  Weight:      Height:        Intake/Output Summary (Last 24 hours) at 02/01/2023 1531 Last data filed at 02/01/2023 1416 Gross per 24 hour  Intake 580 ml  Output 150 ml  Net 430 ml   Filed Weights   01/31/23 1809 02/01/23 0345  Weight: 68 kg 66 kg    Examination:  General exam: Appears calm and comfortable  Respiratory system: Clear to auscultation. Respiratory effort normal.  Poor air entry at right base. Cardiovascular system: S1 & S2 heard, RRR.  Gastrointestinal system: Soft.  Nontender.  Bowel sound present. Central nervous system: Alert and oriented. No focal neurological deficits. Extremities: Symmetric 5 x 5 power.   Data Reviewed: I have personally reviewed following labs and imaging studies  CBC: Recent Labs  Lab 01/31/23 1850 02/01/23 0431  WBC 8.3 7.4  NEUTROABS 6.4  --   HGB 10.9* 10.1*  HCT 34.8* 32.5*  MCV 81.3 83.3  PLT 329  278   Basic Metabolic Panel: Recent Labs  Lab 01/31/23 1850 02/01/23 0431  NA 134* 135  K 3.9 4.7  CL 97* 100  CO2 27 26  GLUCOSE 91 180*  BUN <5* <5*  CREATININE 0.67 0.74  CALCIUM 8.6* 8.0*  MG 1.8  --    GFR: Estimated  Creatinine Clearance: 66.4 mL/min (by C-G formula based on SCr of 0.74 mg/dL). Liver Function Tests: Recent Labs  Lab 01/31/23 1850 02/01/23 0431  AST 17 16  ALT 13 12  ALKPHOS 174* 139*  BILITOT 0.3 0.3  PROT 6.7 6.1*  ALBUMIN 3.1* 2.7*   No results for input(s): "LIPASE", "AMYLASE" in the last 168 hours. No results for input(s): "AMMONIA" in the last 168 hours. Coagulation Profile: No results for input(s): "INR", "PROTIME" in the last 168 hours. Cardiac Enzymes: No results for input(s): "CKTOTAL", "CKMB", "CKMBINDEX", "TROPONINI" in the last 168 hours. BNP (last 3 results) No results for input(s): "PROBNP" in the last 8760 hours. HbA1C: No results for input(s): "HGBA1C" in the last 72 hours. CBG: No results for input(s): "GLUCAP" in the last 168 hours. Lipid Profile: No results for input(s): "CHOL", "HDL", "LDLCALC", "TRIG", "CHOLHDL", "LDLDIRECT" in the last 72 hours. Thyroid Function Tests: No results for input(s): "TSH", "T4TOTAL", "FREET4", "T3FREE", "THYROIDAB" in the last 72 hours. Anemia Panel: No results for input(s): "VITAMINB12", "FOLATE", "FERRITIN", "TIBC", "IRON", "RETICCTPCT" in the last 72 hours. Sepsis Labs: Recent Labs  Lab 02/01/23 0431  PROCALCITON 9.30    Recent Results (from the past 240 hour(s))  SARS Coronavirus 2 by RT PCR (hospital order, performed in [facility] hospital lab) *cepheid single result test* Anterior Nasal Swab     Status: None   Collection Time: 01/31/23  8:02 PM   Specimen: Anterior Nasal Swab  Result Value Ref Range Status   SARS Coronavirus 2 by RT PCR NEGATIVE NEGATIVE Final    Comment: (NOTE) SARS-CoV-2 target nucleic acids are NOT DETECTED.  The SARS-CoV-2 RNA is generally detectable in upper and lower respiratory specimens during the acute phase of infection. The lowest concentration of SARS-CoV-2 viral copies this assay can detect is 250 copies / mL. A negative result does not preclude SARS-CoV-2 infection and should  not be used as the sole basis for treatment or other patient management decisions.  A negative result may occur with improper specimen collection / handling, submission of specimen other than nasopharyngeal swab, presence of viral mutation(s) within the areas targeted by this assay, and inadequate number of viral copies (<250 copies / mL). A negative result must be combined with clinical observations, patient history, and epidemiological information.  Fact Sheet for Patients:   RoadLapTop.co.za  Fact Sheet for Healthcare Providers: http://kim-miller.com/  This test is not yet approved or  cleared by the [place] FDA and has been authorized for detection and/or diagnosis of SARS-CoV-2 by FDA under an Emergency Use Authorization (EUA).  This EUA will remain in effect (meaning this test can be used) for the duration of the COVID-19 declaration under Section 564(b)(1) of the Act, 21 U.S.C. section 360bbb-3(b)(1), unless the authorization is terminated or revoked sooner.  Performed at [facility], [address], [place], [place] [address]   Expectorated Sputum Assessment w Gram Stain, Rflx to Resp Cult     Status: None   Collection Time: 01/31/23  8:24 PM   Specimen: Expectorated Sputum  Result Value Ref Range Status   Specimen Description EXPECTORATED SPUTUM  Final   Special Requests NONE  Final   Sputum evaluation   Final    THIS SPECIMEN IS ACCEPTABLE FOR SPUTUM CULTURE Performed at [facility], [address], [place], [place] [address]    Report Status 01/31/2023 FINAL  Final  Culture, Respiratory w Gram Stain     Status: None (Preliminary result)   Collection Time: 01/31/23  8:24 PM  Result Value Ref Range Status   Specimen Description   Final    EXPECTORATED SPUTUM Performed at [facility], [address] [place], [place] [address]    Special Requests   Final     NONE Reflexed from [id number] Performed at [facility], [address], [place], [place] [address]    Gram Stain   Final    FEW WBC PRESENT,BOTH PMN AND MONONUCLEAR FEW GRAM POSITIVE COCCI IN PAIRS Performed at [facility], [address] [place], [place] [address]    Culture PENDING  Incomplete   Report Status PENDING  Incomplete  Respiratory (~20 pathogens) panel by PCR     Status: Abnormal   Collection Time: 01/31/23 11:53 PM   Specimen: Nasopharyngeal Swab; Respiratory  Result Value Ref Range Status   Adenovirus NOT DETECTED NOT DETECTED Final   Coronavirus 229E NOT DETECTED NOT DETECTED Final    Comment: (NOTE) The Coronavirus on the Respiratory Panel, DOES NOT test for the novel  Coronavirus (2019 nCoV)    Coronavirus HKU1 NOT DETECTED NOT DETECTED Final   Coronavirus NL63 NOT DETECTED NOT DETECTED Final   Coronavirus OC43 NOT DETECTED NOT DETECTED Final   Metapneumovirus NOT DETECTED NOT DETECTED Final   Rhinovirus / Enterovirus DETECTED (A) NOT DETECTED Final   Influenza A NOT DETECTED NOT DETECTED Final   Influenza B NOT DETECTED NOT DETECTED Final   Parainfluenza Virus 1 NOT DETECTED NOT DETECTED Final   Parainfluenza Virus 2 NOT DETECTED NOT DETECTED Final   Parainfluenza Virus 3 NOT DETECTED NOT DETECTED Final   Parainfluenza Virus 4 NOT DETECTED NOT DETECTED Final   Respiratory Syncytial Virus NOT DETECTED NOT DETECTED Final   Bordetella pertussis NOT DETECTED NOT DETECTED Final   Bordetella Parapertussis NOT DETECTED NOT DETECTED Final   Chlamydophila pneumoniae NOT DETECTED NOT DETECTED Final   Mycoplasma pneumoniae NOT DETECTED NOT DETECTED Final    Comment: Performed at [facility], [address], [place], [place] [address]         Radiology Studies: DG CHEST PORT 1 VIEW  Result Date: 02/01/2023 CLINICAL DATA:  Status post thoracentesis EXAM: PORTABLE CHEST 1 VIEW COMPARISON:  Chest x-ray 01/31/2023 FINDINGS: The heart  size and mediastinal contours are within normal limits. Both lungs are clear. No pleural effusion or pneumothorax. The visualized skeletal structures are unremarkable. IMPRESSION: No active disease. Electronically Signed   By: Darliss Cheney M.D.   On: 02/01/2023 15:16   US THORACENTESIS ASP PLEURAL SPACE W/IMG GUIDE  Result Date: 02/01/2023 INDICATION: Patient with history of tobacco use, COPD, dyspnea, pneumonia, right pleural effusion. Request received for diagnostic and therapeutic right thoracentesis. EXAM: ULTRASOUND GUIDED DIAGNOSTIC AND THERAPEUTIC RIGHT THORACENTESIS MEDICATIONS: 8 mL 1% lidocaine COMPLICATIONS: None immediate. PROCEDURE: An ultrasound guided thoracentesis was thoroughly discussed with the patient and questions answered. The benefits, risks, alternatives and complications were also discussed. The patient understands and wishes to proceed with the procedure. Written consent was obtained. Ultrasound was performed to localize and mark an adequate pocket of fluid in the right chest. The area was then prepped and draped in  the normal sterile fashion. 1% Lidocaine was used for local anesthesia. Under ultrasound guidance a 6 Fr Safe-T-Centesis catheter was introduced. Thoracentesis was performed. The catheter was removed and a dressing applied. FINDINGS: A total of approximately 110 cc of dark, bloody fluid was removed. Samples were sent to the laboratory as requested by the clinical team. The pleural fluid collection was multiloculated and only the above amount of fluid could be aspirated at this time. IMPRESSION: Successful ultrasound guided diagnostic and therapeutic right thoracentesis yielding 110 cc of pleural fluid. Performed by: Artemio Aly Electronically Signed   By: Irish Lack M.D.   On: 02/01/2023 14:00   DG Chest Port 1 View  Result Date: 01/31/2023 CLINICAL DATA:  Shortness of breath EXAM: PORTABLE CHEST 1 VIEW COMPARISON:  Chest x-ray 07/26/2022 FINDINGS: There is  haziness overlying the lower right lung which may represent pleural effusion. There is no lung consolidation or pneumothorax. The cardiomediastinal silhouette is within normal limits. IMPRESSION: Haziness overlying the lower right lung which may represent pleural effusion. Electronically Signed   By: Darliss Cheney M.D.   On: 01/31/2023 19:33        Scheduled Meds:  amiodarone  200 mg Oral Daily   arformoterol  15 mcg Nebulization BID   budesonide (PULMICORT) nebulizer solution  0.25 mg Nebulization BID   busPIRone  5 mg Oral BID   escitalopram  15 mg Oral Daily   gabapentin  800 mg Oral TID   guaiFENesin  600 mg Oral BID   methylPREDNISolone (SOLU-MEDROL) injection  40 mg Intravenous Q12H   nicotine  21 mg Transdermal Daily   pantoprazole  40 mg Oral Daily   sodium chloride flush  3 mL Intravenous Q12H   Continuous Infusions:  azithromycin     cefTRIAXone (ROCEPHIN)  IV       LOS: 1 day    Time spent: 35 minutes    Dorcas Carrow, MD [facility]

## 2023-02-01 NOTE — Procedures (Addendum)
Ultrasound-guided diagnostic and therapeutic right thoracentesis performed yielding 110 cc of  dark,bloody fluid. No immediate complications. Follow-up chest x-ray pending. The fluid was sent to the lab for preordered studies. The pleural fluid collection was small and multiloculated and only the above amount of fluid could be aspirated at this time. Recommend f/u CT chest if no improvement in symptoms. EBL none.

## 2023-02-02 DIAGNOSIS — J9601 Acute respiratory failure with hypoxia: Secondary | ICD-10-CM | POA: Diagnosis not present

## 2023-02-02 LAB — CYTOLOGY - NON PAP

## 2023-02-02 MED ORDER — ALUM & MAG HYDROXIDE-SIMETH 200-200-20 MG/5ML PO SUSP
15.0000 mL | ORAL | Status: DC | PRN
  Administered 2023-02-02 (×2): 15 mL via ORAL
  Filled 2023-02-02 (×2): qty 30

## 2023-02-02 MED ORDER — APIXABAN 5 MG PO TABS
5.0000 mg | ORAL_TABLET | Freq: Two times a day (BID) | ORAL | Status: DC
  Administered 2023-02-02 – 2023-02-03 (×3): 5 mg via ORAL
  Filled 2023-02-02 (×3): qty 1

## 2023-02-02 NOTE — Plan of Care (Signed)
  Problem: Activity: Goal: Ability to tolerate increased activity will improve Outcome: Progressing   Problem: Respiratory: Goal: Ability to maintain a clear airway will improve Outcome: Progressing   Problem: Respiratory: Goal: Levels of oxygenation will improve Outcome: Progressing   Problem: Respiratory: Goal: Ability to maintain adequate ventilation will improve Outcome: Progressing   Problem: Clinical Measurements: Goal: Ability to maintain clinical measurements within normal limits will improve Outcome: Progressing

## 2023-02-02 NOTE — Progress Notes (Signed)
Mobility Specialist - Progress Note   02/02/23 1059  Mobility  Activity Ambulated with assistance in hallway;Ambulated with assistance to bathroom  Level of Assistance Standby assist, set-up cues, supervision of patient - no hands on  Assistive Device Front wheel walker  Distance Ambulated (ft) 350 ft  Range of Motion/Exercises Active  Activity Response Tolerated well  Mobility Referral Yes  $Mobility charge 1 Mobility  Mobility Specialist Start Time (ACUTE ONLY) 1035  Mobility Specialist Stop Time (ACUTE ONLY) 1059  Mobility Specialist Time Calculation (min) (ACUTE ONLY) 24 min   Pt was found in bed and agreeable to ambulate. No complaints with session. At EOS returned to use bathroom. Was left in bed with all needs met. Call bell in reach.  Billey Chang Mobility Specialist

## 2023-02-02 NOTE — Progress Notes (Signed)
PROGRESS NOTE    Karen Casey  ZOX:[id number] DOB: [birth year] DOA: 01/31/2023 PCP: Billey Co, MD    Brief Narrative:  67 year old with history of COPD, paroxysmal A-fib on Eliquis, depression, anxiety and smoker presented to the ER for evaluation of shortness of breath for about 10 days.  She has been been having symptoms of fatigue, shortness of breath and nausea, fever, chills and diaphoresis.  Worsening cough from baseline.  She was seen at primary care physician's office.  CT scan done on 8/16 did show right-sided pleural effusion and multifocal patchy infiltrates bilateral lung fields so she was referred to ER.  In the emergency room hemodynamically stable.  85% on room air on mobility.  Placed on 2 L oxygen.  WBC count normal.  COVID-19 negative.  Portable chest x-ray shows haziness over the right lower lung.  Given IV antibiotics and admitted to the hospital.   Assessment & Plan:   Multifocal pneumonia with right-sided pleural effusion, suspect parapneumonic effusion: Hypoxia only. Will continue Rocephin and azithromycin today.  Continue aggressive Chest physiotherapy, incentive spirometry, deep breathing exercises, sputum induction, mucolytic's and bronchodilators. Sputum cultures, blood cultures, Legionella and streptococcal antigen.  Negative so far. Supplemental oxygen to keep saturations more than 90%. Thoracentesis right side with 110 cc transudative fluid removed.  Cultures negative so far.  Postthoracentesis chest x-ray with good clinical improvement and expansion of lungs. Respiratory panel positive for rhinovirus, patient probably developed postviral pneumonia.  COPD with acute exacerbation with hypoxia: Antibiotics as above.  Chest physiotherapy as above.  Continue Brovana and Pulmicort and nebulizer.  Discontinue steroids causing restlessness and sleeplessness.  Chronic medical issues including Paroxysmal A-fib, currently sinus rhythm.  On amiodarone.  Resume  Eliquis. Depression and anxiety, on multiple medications including BuSpar, Lexapro, trazodone and Klonopin. Neuropathy, on gabapentin Smoker, nicotine patch.  Counseled to quit.  Some clinical improvement today.  Continue to mobilize.  Anticipate home tomorrow if remains stable.  DVT prophylaxis: SCDs Start: 01/31/23 2201 apixaban (ELIQUIS) tablet 5 mg   Code Status: Full code Family Communication: None at bedside Disposition Plan: Status is: Inpatient Remains inpatient appropriate because: IV antibiotics.  Inpatient procedures.     Consultants:  None  Procedures:  Thoracentesis right  Antimicrobials:  Rocephin azithromycin 8/19---   Subjective: Patient seen and examined.  She has unrelenting cough with minimal sputum production.  Feels weak.  Afebrile.  Blood pressure adequate.  On room air today.  Objective: Vitals:   02/02/23 0427 02/02/23 0452 02/02/23 0835 02/02/23 1248  BP: (!) 153/81   (!) 148/95  Pulse: 88   77  Resp: 17   20  Temp: (!) 97.5 F (36.4 C)   98 F (36.7 C)  TempSrc: Oral   Oral  SpO2: 94%  93% 95%  Weight:  67.3 kg    Height:        Intake/Output Summary (Last 24 hours) at 02/02/2023 1435 Last data filed at 02/02/2023 1020 Gross per 24 hour  Intake 240 ml  Output --  Net 240 ml   Filed Weights   01/31/23 1809 02/01/23 0345 02/02/23 0452  Weight: 68 kg 66 kg 67.3 kg    Examination:  General exam: Slightly she is but looks comfortable. Respiratory system: Bronchial breath sounds.  Conducted upper airway sounds.  On room air.  Nagging cough on deep breathing. Cardiovascular system: S1 & S2 heard, RRR.  Gastrointestinal system: Soft.  Nontender.  Bowel sound present. Central nervous system: Alert and oriented. No focal  neurological deficits. Extremities: Symmetric 5 x 5 power.   Data Reviewed: I have personally reviewed following labs and imaging studies  CBC: Recent Labs  Lab 01/31/23 1850 02/01/23 0431  WBC 8.3 7.4   NEUTROABS 6.4  --   HGB 10.9* 10.1*  HCT 34.8* 32.5*  MCV 81.3 83.3  PLT 329 278   Basic Metabolic Panel: Recent Labs  Lab 01/31/23 1850 02/01/23 0431  NA 134* 135  K 3.9 4.7  CL 97* 100  CO2 27 26  GLUCOSE 91 180*  BUN <5* <5*  CREATININE 0.67 0.74  CALCIUM 8.6* 8.0*  MG 1.8  --    GFR: Estimated Creatinine Clearance: 67 mL/min (by C-G formula based on SCr of 0.74 mg/dL). Liver Function Tests: Recent Labs  Lab 01/31/23 1850 02/01/23 0431  AST 17 16  ALT 13 12  ALKPHOS 174* 139*  BILITOT 0.3 0.3  PROT 6.7 6.1*  ALBUMIN 3.1* 2.7*   No results for input(s): "LIPASE", "AMYLASE" in the last 168 hours. No results for input(s): "AMMONIA" in the last 168 hours. Coagulation Profile: No results for input(s): "INR", "PROTIME" in the last 168 hours. Cardiac Enzymes: No results for input(s): "CKTOTAL", "CKMB", "CKMBINDEX", "TROPONINI" in the last 168 hours. BNP (last 3 results) No results for input(s): "PROBNP" in the last 8760 hours. HbA1C: No results for input(s): "HGBA1C" in the last 72 hours. CBG: No results for input(s): "GLUCAP" in the last 168 hours. Lipid Profile: No results for input(s): "CHOL", "HDL", "LDLCALC", "TRIG", "CHOLHDL", "LDLDIRECT" in the last 72 hours. Thyroid Function Tests: No results for input(s): "TSH", "T4TOTAL", "FREET4", "T3FREE", "THYROIDAB" in the last 72 hours. Anemia Panel: No results for input(s): "VITAMINB12", "FOLATE", "FERRITIN", "TIBC", "IRON", "RETICCTPCT" in the last 72 hours. Sepsis Labs: Recent Labs  Lab 02/01/23 0431  PROCALCITON 9.30    Recent Results (from the past 240 hour(s))  SARS Coronavirus 2 by RT PCR (hospital order, performed in [facility] hospital lab) *cepheid single result test* Anterior Nasal Swab     Status: None   Collection Time: 01/31/23  8:02 PM   Specimen: Anterior Nasal Swab  Result Value Ref Range Status   SARS Coronavirus 2 by RT PCR NEGATIVE NEGATIVE Final    Comment: (NOTE) SARS-CoV-2 target  nucleic acids are NOT DETECTED.  The SARS-CoV-2 RNA is generally detectable in upper and lower respiratory specimens during the acute phase of infection. The lowest concentration of SARS-CoV-2 viral copies this assay can detect is 250 copies / mL. A negative result does not preclude SARS-CoV-2 infection and should not be used as the sole basis for treatment or other patient management decisions.  A negative result may occur with improper specimen collection / handling, submission of specimen other than nasopharyngeal swab, presence of viral mutation(s) within the areas targeted by this assay, and inadequate number of viral copies (<250 copies / mL). A negative result must be combined with clinical observations, patient history, and epidemiological information.  Fact Sheet for Patients:   RoadLapTop.co.za  Fact Sheet for Healthcare Providers: http://kim-miller.com/  This test is not yet approved or  cleared by the [place] FDA and has been authorized for detection and/or diagnosis of SARS-CoV-2 by FDA under an Emergency Use Authorization (EUA).  This EUA will remain in effect (meaning this test can be used) for the duration of the COVID-19 declaration under Section 564(b)(1) of the Act, 21 U.S.C. section 360bbb-3(b)(1), unless the authorization is terminated or revoked sooner.  Performed at [facility], [address], [place], [place] [address]   Blood culture (routine x 2)     Status: None (Preliminary result)   Collection Time: 01/31/23  8:10 PM   Specimen: BLOOD  Result Value Ref Range Status   Specimen Description   Final    BLOOD BLOOD LEFT FOREARM Performed at [facility], [address], [place], [place] [address]    Special Requests   Final    BOTTLES DRAWN AEROBIC AND ANAEROBIC Blood Culture adequate volume Performed at [facility], [address],  [place], [place] [address]    Culture   Final    NO GROWTH 1 DAY Performed at [facility], [address], [place], [place] [address]    Report Status PENDING  Incomplete  Blood culture (routine x 2)     Status: None (Preliminary result)   Collection Time: 01/31/23  8:15 PM   Specimen: BLOOD  Result Value Ref Range Status   Specimen Description   Final    BLOOD BLOOD RIGHT FOREARM Performed at [facility], [address] [place], [place] [address]    Special Requests   Final    BOTTLES DRAWN AEROBIC AND ANAEROBIC Blood Culture adequate volume Performed at [facility], [address], [place], [place] [address]    Culture   Final    NO GROWTH 1 DAY Performed at [facility], [address], [place], [place] [address]    Report Status PENDING  Incomplete  Expectorated Sputum Assessment w Gram Stain, Rflx to Resp Cult     Status: None   Collection Time: 01/31/23  8:24 PM   Specimen: Expectorated Sputum  Result Value Ref Range Status   Specimen Description EXPECTORATED SPUTUM  Final   Special Requests NONE  Final   Sputum evaluation   Final    THIS SPECIMEN IS ACCEPTABLE FOR SPUTUM CULTURE Performed at [facility], [address], [place], [place] [address]    Report Status 01/31/2023 FINAL  Final  Culture, Respiratory w Gram Stain     Status: None (Preliminary result)   Collection Time: 01/31/23  8:24 PM  Result Value Ref Range Status   Specimen Description   Final    EXPECTORATED SPUTUM Performed at [facility], [address], [place], [place] [address]    Special Requests   Final    NONE Reflexed from [id number] Performed at [facility], [address], [place], [place] [address]    Gram Stain   Final    FEW WBC PRESENT,BOTH PMN AND MONONUCLEAR FEW GRAM POSITIVE COCCI IN PAIRS    Culture   Final    CULTURE REINCUBATED FOR BETTER GROWTH Performed at [facility], [address], [place], [place] [address]    Report Status PENDING  Incomplete  Respiratory (~20 pathogens) panel by PCR     Status: Abnormal   Collection Time: 01/31/23 11:53 PM   Specimen: Nasopharyngeal Swab; Respiratory  Result Value Ref Range Status   Adenovirus NOT DETECTED NOT DETECTED Final   Coronavirus 229E NOT DETECTED NOT DETECTED Final    Comment: (NOTE) The Coronavirus on the Respiratory Panel, DOES NOT test for the novel  Coronavirus (2019 nCoV)    Coronavirus HKU1 NOT DETECTED NOT DETECTED Final   Coronavirus NL63 NOT DETECTED NOT DETECTED Final   Coronavirus OC43 NOT DETECTED NOT DETECTED Final   Metapneumovirus NOT DETECTED NOT DETECTED Final  Rhinovirus / Enterovirus DETECTED (A) NOT DETECTED Final   Influenza A NOT DETECTED NOT DETECTED Final   Influenza B NOT DETECTED NOT DETECTED Final   Parainfluenza Virus 1 NOT DETECTED NOT DETECTED Final   Parainfluenza Virus 2 NOT DETECTED NOT DETECTED Final   Parainfluenza Virus 3 NOT DETECTED NOT DETECTED Final   Parainfluenza Virus 4 NOT DETECTED NOT DETECTED Final   Respiratory Syncytial Virus NOT DETECTED NOT DETECTED Final   Bordetella pertussis NOT DETECTED NOT DETECTED Final   Bordetella Parapertussis NOT DETECTED NOT DETECTED Final   Chlamydophila pneumoniae NOT DETECTED NOT DETECTED Final   Mycoplasma pneumoniae NOT DETECTED NOT DETECTED Final    Comment: Performed at [facility], [address], [place], [place] [address]         Radiology Studies: DG CHEST PORT 1 VIEW  Result Date: 02/01/2023 CLINICAL DATA:  Status post thoracentesis EXAM: PORTABLE CHEST 1 VIEW COMPARISON:  Chest x-ray 01/31/2023 FINDINGS: The heart size and mediastinal contours are within normal limits. Both lungs are clear. No pleural effusion or pneumothorax. The visualized skeletal structures are unremarkable. IMPRESSION: No active disease. Electronically Signed   By: Darliss Cheney M.D.   On: 02/01/2023 15:16   US  THORACENTESIS ASP PLEURAL SPACE W/IMG GUIDE  Result Date: 02/01/2023 INDICATION: Patient with history of tobacco use, COPD, dyspnea, pneumonia, right pleural effusion. Request received for diagnostic and therapeutic right thoracentesis. EXAM: ULTRASOUND GUIDED DIAGNOSTIC AND THERAPEUTIC RIGHT THORACENTESIS MEDICATIONS: 8 mL 1% lidocaine COMPLICATIONS: None immediate. PROCEDURE: An ultrasound guided thoracentesis was thoroughly discussed with the patient and questions answered. The benefits, risks, alternatives and complications were also discussed. The patient understands and wishes to proceed with the procedure. Written consent was obtained. Ultrasound was performed to localize and mark an adequate pocket of fluid in the right chest. The area was then prepped and draped in the normal sterile fashion. 1% Lidocaine was used for local anesthesia. Under ultrasound guidance a 6 Fr Safe-T-Centesis catheter was introduced. Thoracentesis was performed. The catheter was removed and a dressing applied. FINDINGS: A total of approximately 110 cc of dark, bloody fluid was removed. Samples were sent to the laboratory as requested by the clinical team. The pleural fluid collection was multiloculated and only the above amount of fluid could be aspirated at this time. IMPRESSION: Successful ultrasound guided diagnostic and therapeutic right thoracentesis yielding 110 cc of pleural fluid. Performed by: Artemio Aly Electronically Signed   By: Irish Lack M.D.   On: 02/01/2023 14:00   DG Chest Port 1 View  Result Date: 01/31/2023 CLINICAL DATA:  Shortness of breath EXAM: PORTABLE CHEST 1 VIEW COMPARISON:  Chest x-ray 07/26/2022 FINDINGS: There is haziness overlying the lower right lung which may represent pleural effusion. There is no lung consolidation or pneumothorax. The cardiomediastinal silhouette is within normal limits. IMPRESSION: Haziness overlying the lower right lung which may represent pleural effusion.  Electronically Signed   By: Darliss Cheney M.D.   On: 01/31/2023 19:33        Scheduled Meds:  amiodarone  200 mg Oral Daily   apixaban  5 mg Oral BID   arformoterol  15 mcg Nebulization BID   budesonide (PULMICORT) nebulizer solution  0.25 mg Nebulization BID   busPIRone  5 mg Oral BID   escitalopram  15 mg Oral Daily   gabapentin  800 mg Oral TID   guaiFENesin  600 mg Oral BID   nicotine  21 mg Transdermal Daily   pantoprazole  40 mg  Oral Daily   sodium chloride flush  3 mL Intravenous Q12H   Continuous Infusions:  azithromycin Stopped (02/01/23 1957)   cefTRIAXone (ROCEPHIN)  IV Stopped (02/01/23 1800)     LOS: 2 days    Time spent: 35 minutes    Dorcas Carrow, MD [facility]

## 2023-02-02 NOTE — Plan of Care (Signed)
  Problem: Activity: Goal: Ability to tolerate increased activity will improve Outcome: Progressing   Problem: Respiratory: Goal: Ability to maintain a clear airway will improve Outcome: Progressing   Problem: Respiratory: Goal: Ability to maintain adequate ventilation will improve Outcome: Progressing

## 2023-02-02 NOTE — TOC CM/SW Note (Signed)
Transition of Care [facility]) - Inpatient Brief Assessment   Patient Details  Name: Karen Casey MRN: [id number] Date of Birth: [birth year]  Transition of Care [facility]) CM/SW Contact:    Larrie Kass, LCSW Phone Number: 02/02/2023, 12:49 PM   Clinical Narrative:   Spoke with pt regarding SDOH for transportation, pt reports her sister will provide transport at the time of d/c. Pt stated she is looking for resources to get to her doctor visits. Pt reports she does not have Medicaid, due to not meeting income. CSW informed pt she would have to privately pay for transportation. CSW informed pt about low-cost transport. Pt stated she can not ride the bus, due to it taking too long to get to her destination. She reported she has used the [employer] bus, but states it is hard for her to get her walker down the stairs to meet the bus at the curb. Pt has no DME needs, TOC sign-off.         Transition of Care Asessment: Insurance and Status: Insurance coverage has been reviewed Patient has primary care physician: Yes Home environment has been reviewed: lives with roommate Prior level of function:: mod independent Prior/Current Home Services: No current home services Social Determinants of Health Reivew: SDOH reviewed no interventions necessary Readmission risk has been reviewed: Yes Transition of care needs: no transition of care needs at this time

## 2023-02-02 NOTE — Progress Notes (Signed)
Mobility Specialist - Progress Note   02/02/23 1451  Mobility  Activity Ambulated with assistance to bathroom;Ambulated with assistance in hallway  Level of Assistance Standby assist, set-up cues, supervision of patient - no hands on  Assistive Device Front wheel walker  Distance Ambulated (ft) 600 ft  Range of Motion/Exercises Active  Activity Response Tolerated well  Mobility Referral Yes  $Mobility charge 1 Mobility  Mobility Specialist Start Time (ACUTE ONLY) 1430  Mobility Specialist Stop Time (ACUTE ONLY) 1450  Mobility Specialist Time Calculation (min) (ACUTE ONLY) 20 min   Pt was found in bed and agreeable to ambulate. No complaints with session. At EOS returned to bed with all needs met. Call bell in reach.  Billey Chang Mobility Specialist

## 2023-02-03 ENCOUNTER — Telehealth: Payer: Self-pay

## 2023-02-03 DIAGNOSIS — J9601 Acute respiratory failure with hypoxia: Secondary | ICD-10-CM | POA: Diagnosis not present

## 2023-02-03 LAB — LEGIONELLA PNEUMOPHILA SEROGP 1 UR AG: L. pneumophila Serogp 1 Ur Ag: NEGATIVE

## 2023-02-03 LAB — CULTURE, RESPIRATORY W GRAM STAIN: Culture: NORMAL

## 2023-02-03 MED ORDER — GUAIFENESIN ER 600 MG PO TB12: 600.0000 mg | ORAL_TABLET | Freq: Two times a day (BID) | ORAL | 0 refills | Status: DC

## 2023-02-03 MED ORDER — GUAIFENESIN 100 MG/5ML PO LIQD: 10.0000 mL | Freq: Four times a day (QID) | ORAL | 0 refills | Status: DC | PRN

## 2023-02-03 MED ORDER — NICOTINE 21 MG/24HR TD PT24: 21.0000 mg | MEDICATED_PATCH | Freq: Every day | TRANSDERMAL | 0 refills | Status: DC

## 2023-02-03 MED ORDER — AMOXICILLIN-POT CLAVULANATE 875-125 MG PO TABS: 1.0000 | ORAL_TABLET | Freq: Two times a day (BID) | ORAL | 0 refills | Status: AC

## 2023-02-03 NOTE — Telephone Encounter (Signed)
Patient calls nurse line requesting to speak with Dr. Miquel Dunn as soon as possible.   She asked if she could speak with Dr. Miquel Dunn right now. I advised that provider is not in the office at this time and asked if I could help her.   She is asking about the results of her recent hospitalization. She reports that the nurse told her that there was no evidence of cancer, however, she would like Dr. Miquel Dunn to review and discuss with her.   Patient again asked to speak with provider today. Advised that I would forward message to provider, however, she would not get to speak with Dr. Miquel Dunn today.   She requests returned call when provider is able to do so.   Veronda Prude, RN

## 2023-02-03 NOTE — Progress Notes (Signed)
Mobility Specialist - Progress Note   02/03/23 0953  Mobility  Activity Ambulated with assistance in hallway  Level of Assistance Modified independent, requires aide device or extra time  Assistive Device Front wheel walker  Distance Ambulated (ft) 600 ft  Range of Motion/Exercises Active  Activity Response Tolerated well  Mobility Referral Yes  $Mobility charge 1 Mobility  Mobility Specialist Start Time (ACUTE ONLY) 0940  Mobility Specialist Stop Time (ACUTE ONLY) 0953  Mobility Specialist Time Calculation (min) (ACUTE ONLY) 13 min   Pt was found in bed and agreeable to ambulate. No complaints with session and at EOS returned to bathroom with all need met. NT in room.  Billey Chang Mobility Specialist

## 2023-02-03 NOTE — Plan of Care (Signed)
  Problem: Activity: Goal: Ability to tolerate increased activity will improve Outcome: Progressing Goal: Will verbalize the importance of balancing activity with adequate rest periods Outcome: Progressing   Problem: Respiratory: Goal: Ability to maintain a clear airway will improve Outcome: Progressing Goal: Levels of oxygenation will improve Outcome: Progressing   Problem: Activity: Goal: Ability to tolerate increased activity will improve Outcome: Progressing   Problem: Respiratory: Goal: Ability to maintain adequate ventilation will improve Outcome: Progressing Goal: Ability to maintain a clear airway will improve Outcome: Progressing

## 2023-02-03 NOTE — Discharge Summary (Signed)
Physician Discharge Summary  Karen Casey ZOX:[id number] DOB: 06-26-1955 DOA: 01/31/2023  PCP: Billey Co, MD  Admit date: 01/31/2023 Discharge date: 02/03/2023  Admitted From: Home Disposition: Home  Recommendations for Outpatient Follow-up:  Follow up with PCP in 1-2 weeks Please obtain BMP/CBC in one week Keep up scheduled CT scan of the chest for cancer screening.  Home Health: N/A Equipment/Devices: N/A  Discharge Condition: Stable CODE STATUS: Full code Diet recommendation: Regular diet, nutritional supplements  Discharge summary: 67 year old with history of COPD, paroxysmal A-fib on Eliquis, depression, anxiety and smoker presented to the ER for evaluation of shortness of breath for about 10 days.  She has been been having symptoms of fatigue, shortness of breath and nausea, fever, chills and diaphoresis.  Worsening cough from baseline.  She was seen at primary care physician's office.  CT scan done on 8/16 did show right-sided pleural effusion and multifocal patchy infiltrates bilateral lung fields so she was referred to ER.  In the emergency room hemodynamically stable.  85% on room air on mobility.  Placed on 2 L oxygen.  WBC count normal.  COVID-19 negative.  Portable chest x-ray shows haziness over the right lower lung.  Given IV antibiotics and admitted to the hospital.   # Multifocal pneumonia with right-sided pleural effusion, suspect parapneumonic effusion:  Hypoxia.  Improved now. Patient did good clinical recovery on Rocephin and azithromycin.  She was given a dose of steroid but that caused her tremulousness and sleeplessness.  She also received physiotherapy, incentive spirometry and mucolytic's. Blood cultures negative.  Strep pneumonia urinary antigen positive.  Legionella negative. Respiratory panel positive for rhinovirus. Right-sided thoracentesis, 110 mL hemorrhagic fluid removed.  Resulting x-ray with improved and cleared effusion.  Transudate but  hemorrhagic. Possible streptococcal pneumonia with postviral pneumonia. Clinically improved.  Received 3 days of Rocephin azithromycin. Continue Augmentin to complete total 7 days of therapy for  streptococcal pneumonia.   COPD with acute exacerbation with hypoxia: Antibiotics as above.  Chest physiotherapy as above.  Continue her home regimen for COPD including steroids and long-acting beta agonist.  Oral steroids discontinued due to them causing restlessness and sleeplessness.   Chronic medical issues including Paroxysmal A-fib, currently sinus rhythm.  On amiodarone.  Therapeutic on Eliquis. Depression and anxiety, on multiple medications including BuSpar, Lexapro, trazodone and Klonopin. Neuropathy, on gabapentin Smoker, nicotine patch.  Counseled to quit.   Patient has done good clinical recovery today.  Still has significant cough.  On room air.  Mobilized around the hallway on room air.  Stable to discharge home, complete antibiotic therapy and continue COPD treatment.   Discharge Diagnoses:  Principal Problem:   Acute respiratory failure with hypoxia (HCC) Active Problems:   Tobacco use   Multifocal pneumonia   Pleural effusion on right   COPD with acute exacerbation (HCC)   Paroxysmal atrial fibrillation (HCC)   Depression with anxiety    Discharge Instructions  Discharge Instructions     Diet - low sodium heart healthy   Complete by: As directed    Increase activity slowly   Complete by: As directed       Allergies as of 02/03/2023       Reactions   Capsaicin-menthol Rash, Other (See Comments)   Burning and peeling on area applied to   Diclo Gel [diclofenac Sodium] Rash, Other (See Comments)   Burning and peeling at the sight of application.        Medication List     STOP taking  these medications    amitriptyline 10 MG tablet Commonly known as: ELAVIL   lidocaine 5 % Commonly known as: LIDODERM       TAKE these medications    albuterol 108  (90 Base) MCG/ACT inhaler Commonly known as: VENTOLIN HFA Inhale 2 puffs into the lungs every 6 (six) hours as needed for wheezing or shortness of breath.   amiodarone 200 MG tablet Commonly known as: PACERONE Take 1 tablet (200 mg total) by mouth daily.   amoxicillin-clavulanate 875-125 MG tablet Commonly known as: AUGMENTIN Take 1 tablet by mouth 2 (two) times daily for 5 days.   apixaban 5 MG Tabs tablet Commonly known as: ELIQUIS Take 1 tablet (5 mg total) by mouth 2 (two) times daily.   busPIRone 5 MG tablet Commonly known as: BUSPAR Take 5 mg by mouth 2 (two) times daily.   CENTRUM ADULT PO Take 1 tablet by mouth daily.   cholecalciferol 25 MCG (1000 UNIT) tablet Commonly known as: VITAMIN D3 Take 1,000 Units by mouth daily.   clonazePAM 0.5 MG tablet Commonly known as: KLONOPIN Take 1 tablet (0.5 mg total) by mouth 2 (two) times daily as needed for anxiety. Ok to fill 12/29/22   escitalopram 5 MG tablet Commonly known as: LEXAPRO Take 3 tablets (15 mg total) by mouth daily.   ferrous sulfate 325 (65 FE) MG tablet Take 1 tablet (325 mg total) by mouth every other day.   gabapentin 400 MG capsule Commonly known as: NEURONTIN Take 2 capsules (800 mg total) by mouth 3 (three) times daily.   guaiFENesin 600 MG 12 hr tablet Commonly known as: MUCINEX Take 1 tablet (600 mg total) by mouth 2 (two) times daily.   guaiFENesin 100 MG/5ML liquid Commonly known as: ROBITUSSIN Take 10 mLs by mouth every 6 (six) hours as needed for cough or to loosen phlegm.   hydrOXYzine 25 MG tablet Commonly known as: ATARAX Take 1 tablet (25 mg total) by mouth 3 (three) times daily as needed for anxiety.   nicotine 21 mg/24hr patch Commonly known as: NICODERM CQ - dosed in mg/24 hours Place 1 patch (21 mg total) onto the skin daily.   pantoprazole 40 MG tablet Commonly known as: Protonix Take 1 tablet (40 mg total) by mouth daily.   traZODone 50 MG tablet Commonly known as:  DESYREL Take 1 tablet (50 mg total) by mouth at bedtime as needed for sleep. What changed: when to take this   Trelegy Ellipta 100-62.5-25 MCG/ACT Aepb Generic drug: Fluticasone-Umeclidin-Vilant Inhale 1 puff into the lungs daily.   Vitamin D (Ergocalciferol) 1.25 MG (50000 UNIT) Caps capsule Commonly known as: DRISDOL Take 1 capsule (50,000 Units total) by mouth every 7 (seven) days.        Follow-up Information     Schedule an appointment as soon as possible for a visit  with Connect with your PCP/Specialist as discussed.   Contact information: [email or web address] Call our physician referral line at [phone].               Allergies  Allergen Reactions   Capsaicin-Menthol Rash and Other (See Comments)    Burning and peeling on area applied to   Diclo Gel [Diclofenac Sodium] Rash and Other (See Comments)    Burning and peeling at the sight of application.    Consultations: None   Procedures/Studies: DG CHEST PORT 1 VIEW  Result Date: 02/01/2023 CLINICAL DATA:  Status post thoracentesis EXAM: PORTABLE CHEST 1 VIEW COMPARISON:  Chest x-ray 01/31/2023 FINDINGS:  The heart size and mediastinal contours are within normal limits. Both lungs are clear. No pleural effusion or pneumothorax. The visualized skeletal structures are unremarkable. IMPRESSION: No active disease. Electronically Signed   By: Darliss Cheney M.D.   On: 02/01/2023 15:16   US THORACENTESIS ASP PLEURAL SPACE W/IMG GUIDE  Result Date: 02/01/2023 INDICATION: Patient with history of tobacco use, COPD, dyspnea, pneumonia, right pleural effusion. Request received for diagnostic and therapeutic right thoracentesis. EXAM: ULTRASOUND GUIDED DIAGNOSTIC AND THERAPEUTIC RIGHT THORACENTESIS MEDICATIONS: 8 mL 1% lidocaine COMPLICATIONS: None immediate. PROCEDURE: An ultrasound guided thoracentesis was thoroughly discussed with the patient and questions answered. The benefits, risks,  alternatives and complications were also discussed. The patient understands and wishes to proceed with the procedure. Written consent was obtained. Ultrasound was performed to localize and mark an adequate pocket of fluid in the right chest. The area was then prepped and draped in the normal sterile fashion. 1% Lidocaine was used for local anesthesia. Under ultrasound guidance a 6 Fr Safe-T-Centesis catheter was introduced. Thoracentesis was performed. The catheter was removed and a dressing applied. FINDINGS: A total of approximately 110 cc of dark, bloody fluid was removed. Samples were sent to the laboratory as requested by the clinical team. The pleural fluid collection was multiloculated and only the above amount of fluid could be aspirated at this time. IMPRESSION: Successful ultrasound guided diagnostic and therapeutic right thoracentesis yielding 110 cc of pleural fluid. Performed by: Artemio Aly Electronically Signed   By: Irish Lack M.D.   On: 02/01/2023 14:00   DG Chest Port 1 View  Result Date: 01/31/2023 CLINICAL DATA:  Shortness of breath EXAM: PORTABLE CHEST 1 VIEW COMPARISON:  Chest x-ray 07/26/2022 FINDINGS: There is haziness overlying the lower right lung which may represent pleural effusion. There is no lung consolidation or pneumothorax. The cardiomediastinal silhouette is within normal limits. IMPRESSION: Haziness overlying the lower right lung which may represent pleural effusion. Electronically Signed   By: Darliss Cheney M.D.   On: 01/31/2023 19:33   DG Ankle Complete Right  Result Date: 01/24/2023 CLINICAL DATA:  Recent fall with right ankle pain, initial encounter EXAM: RIGHT ANKLE - COMPLETE 3+ VIEW COMPARISON:  None Available. FINDINGS: Calcaneal spurs are noted. No acute fracture or dislocation is seen. No soft tissue abnormality is noted. IMPRESSION: Degenerative change without acute abnormality. Electronically Signed   By: Alcide Clever M.D.   On: 01/24/2023 02:16    DG Lumbar Spine Complete  Result Date: 01/24/2023 CLINICAL DATA:  Recent fall with low back pain, initial encounter EXAM: LUMBAR SPINE - COMPLETE 4+ VIEW COMPARISON:  None Available. FINDINGS: Five lumbar type vertebral bodies are well visualized. Vertebral body height is well maintained. No pars defects are noted. Mild facet hypertrophic changes are seen. No soft tissue abnormality is noted./ IMPRESSION: Degenerative changes of lumbar spine. No compression deformity is seen. Electronically Signed   By: Alcide Clever M.D.   On: 01/24/2023 02:14   DG Thoracic Spine 2 View  Result Date: 01/24/2023 CLINICAL DATA:  Back pain EXAM: THORACIC SPINE 2 VIEWS COMPARISON:  06/24/2022 FINDINGS: Vertebral body height is well maintained with the exception of vertebral plana at the T6 level. This is stable from the prior chest x-ray. Pedicles are within normal limits. No paraspinal mass is noted. Mild osteophytic changes are seen. Patchy somewhat nodular airspace opacities are noted bilaterally with significant increased opacification in the right base which runs along the course of the major fissure. This may represent loculated fluid. CT  of the chest is recommended for further evaluation. IMPRESSION: Stable vertebral plana at T6. Multilevel degenerative change. Patchy airspace opacities bilaterally with a somewhat nodular appearance. Additionally increased density in the right base is noted suggestive of fluid trapped within the major fissure. CT of the chest is recommended for further evaluation. These results will be called to the ordering clinician or representative by the Radiologist Assistant, and communication documented in the PACS or [employer]. Electronically Signed   By: Alcide Clever M.D.   On: 01/24/2023 02:13   DG Foot Complete Right  Result Date: 01/24/2023 CLINICAL DATA:  right foot pain, h/o fall, swelling x 2 weeks EXAM: RIGHT FOOT COMPLETE - 3+ VIEW COMPARISON:  None Available. FINDINGS:  Diffusely decreased bone density. Severe hallux valgus deformity. Plantar calcaneal spur. There is no evidence of fracture or dislocation. There is no evidence of severe arthropathy or other focal bone abnormality. Soft tissues are unremarkable. IMPRESSION: 1. No acute displaced fracture or dislocation. 2. Diffusely decreased bone density. 3. Severe hallux valgus deformity. Electronically Signed   By: Tish Frederickson M.D.   On: 01/24/2023 01:32   VAS [place] LOWER EXTREMITY VENOUS (DVT)  Result Date: 01/18/2023  Lower Venous DVT Study Patient Name:  Karen Casey  Date of Exam:   01/18/2023 Medical Rec #: [id number]         Accession #:    [id number] Date of Birth: 1955-08-09        Patient Gender: F Patient Age:   78 years Exam Location:  [facility] Procedure:      VAS [place] LOWER EXTREMITY VENOUS (DVT) Referring Phys: MARGARET PRAY --------------------------------------------------------------------------------  Indications: Pain.  Comparison Study: Previous exam on 11/23/21 was negative for DVT. Performing Technologist: Ernestene Mention RVT, RDMS  Examination Guidelines: A complete evaluation includes B-mode imaging, spectral Doppler, color Doppler, and power Doppler as needed of all accessible portions of each vessel. Bilateral testing is considered an integral part of a complete examination. Limited examinations for reoccurring indications may be performed as noted. The reflux portion of the exam is performed with the patient in reverse Trendelenburg.  +---------+---------------+---------+-----------+----------+--------------+ RIGHT    CompressibilityPhasicitySpontaneityPropertiesThrombus Aging +---------+---------------+---------+-----------+----------+--------------+ CFV      Full           Yes      Yes                                 +---------+---------------+---------+-----------+----------+--------------+ SFJ      Full                                                         +---------+---------------+---------+-----------+----------+--------------+ FV Prox  Full           Yes      Yes                                 +---------+---------------+---------+-----------+----------+--------------+ FV Mid   Full           Yes      Yes                                 +---------+---------------+---------+-----------+----------+--------------+  FV DistalFull           Yes      Yes                                 +---------+---------------+---------+-----------+----------+--------------+ PFV      Full                                                        +---------+---------------+---------+-----------+----------+--------------+ POP      Full           Yes      Yes                                 +---------+---------------+---------+-----------+----------+--------------+ PTV      Full                                                        +---------+---------------+---------+-----------+----------+--------------+ PERO     Full                                                        +---------+---------------+---------+-----------+----------+--------------+   +----+---------------+---------+-----------+----------+--------------+ LEFTCompressibilityPhasicitySpontaneityPropertiesThrombus Aging +----+---------------+---------+-----------+----------+--------------+ CFV Full           Yes      Yes                                 +----+---------------+---------+-----------+----------+--------------+     Summary: RIGHT: - No evidence of deep vein thrombosis in the lower extremity. No indirect evidence of obstruction proximal to the inguinal ligament. - No cystic structure found in the popliteal fossa.  LEFT: - No evidence of common femoral vein obstruction.  *See table(s) above for measurements and observations. Electronically signed by Waverly Ferrari MD on 01/18/2023 at 5:43:34 PM.    Final    (Echo, Carotid, EGD, Colonoscopy, ERCP)     Subjective: Patient seen and examined.  Still has some cough but denies any other complaints.  Occasional blood-streaked after nagging cough.  Afebrile.  Walking in the hallway on room air.   Discharge Exam: Vitals:   02/03/23 0533 02/03/23 0744  BP: 135/76   Pulse: 67   Resp: 17   Temp: (!) 97.5 F (36.4 C)   SpO2: 96% 93%   Vitals:   02/02/23 2014 02/03/23 0500 02/03/23 0533 02/03/23 0744  BP: (!) 143/77  135/76   Pulse: 88  67   Resp: 18  17   Temp: 98.7 F (37.1 C)  (!) 97.5 F (36.4 C)   TempSrc: Oral  Oral   SpO2: 93%  96% 93%  Weight:  67.9 kg    Height:        General: Pt is alert, awake, not in acute distress Cardiovascular: RRR, S1/S2 +, no rubs, no gallops Respiratory: Mostly conducted upper airway sounds. Abdominal: Soft,  NT, ND, bowel sounds + Extremities: no edema, no cyanosis    The results of significant diagnostics from this hospitalization (including imaging, microbiology, ancillary and laboratory) are listed below for reference.     Microbiology: Recent Results (from the past 240 hour(s))  SARS Coronavirus 2 by RT PCR (hospital order, performed in [facility] hospital lab) *cepheid single result test* Anterior Nasal Swab     Status: None   Collection Time: 01/31/23  8:02 PM   Specimen: Anterior Nasal Swab  Result Value Ref Range Status   SARS Coronavirus 2 by RT PCR NEGATIVE NEGATIVE Final    Comment: (NOTE) SARS-CoV-2 target nucleic acids are NOT DETECTED.  The SARS-CoV-2 RNA is generally detectable in upper and lower respiratory specimens during the acute phase of infection. The lowest concentration of SARS-CoV-2 viral copies this assay can detect is 250 copies / mL. A negative result does not preclude SARS-CoV-2 infection and should not be used as the sole basis for treatment or other patient management decisions.  A negative result may occur with improper specimen collection / handling, submission of specimen other than  nasopharyngeal swab, presence of viral mutation(s) within the areas targeted by this assay, and inadequate number of viral copies (<250 copies / mL). A negative result must be combined with clinical observations, patient history, and epidemiological information.  Fact Sheet for Patients:   RoadLapTop.co.za  Fact Sheet for Healthcare Providers: http://kim-miller.com/  This test is not yet approved or  cleared by the [place] FDA and has been authorized for detection and/or diagnosis of SARS-CoV-2 by FDA under an Emergency Use Authorization (EUA).  This EUA will remain in effect (meaning this test can be used) for the duration of the COVID-19 declaration under Section 564(b)(1) of the Act, 21 U.S.C. section 360bbb-3(b)(1), unless the authorization is terminated or revoked sooner.  Performed at [facility], [address], [place], [place] [address]   Blood culture (routine x 2)     Status: None (Preliminary result)   Collection Time: 01/31/23  8:10 PM   Specimen: BLOOD  Result Value Ref Range Status   Specimen Description   Final    BLOOD BLOOD LEFT FOREARM Performed at [facility], [address] [place], [place] [address]    Special Requests   Final    BOTTLES DRAWN AEROBIC AND ANAEROBIC Blood Culture adequate volume Performed at [facility], [address], [place], [place] [address]    Culture   Final    NO GROWTH 2 DAYS Performed at [facility], [address] [place], [place] [address]    Report Status PENDING  Incomplete  Blood culture (routine x 2)     Status: None (Preliminary result)   Collection Time: 01/31/23  8:15 PM   Specimen: BLOOD  Result Value Ref Range Status   Specimen Description   Final    BLOOD BLOOD RIGHT FOREARM Performed at [facility], [address], [place], [place] [address]    Special Requests   Final     BOTTLES DRAWN AEROBIC AND ANAEROBIC Blood Culture adequate volume Performed at [facility], [address] [place], [place] [address]    Culture   Final    NO GROWTH 2 DAYS Performed at [facility], [address], [place], [place] [address]    Report Status PENDING  Incomplete  Expectorated Sputum Assessment w Gram Stain, Rflx to Resp Cult  Status: None   Collection Time: 01/31/23  8:24 PM   Specimen: Expectorated Sputum  Result Value Ref Range Status   Specimen Description EXPECTORATED SPUTUM  Final   Special Requests NONE  Final   Sputum evaluation   Final    THIS SPECIMEN IS ACCEPTABLE FOR SPUTUM CULTURE Performed at [facility], [address] [place], [place] [address]    Report Status 01/31/2023 FINAL  Final  Culture, Respiratory w Gram Stain     Status: None (Preliminary result)   Collection Time: 01/31/23  8:24 PM  Result Value Ref Range Status   Specimen Description   Final    EXPECTORATED SPUTUM Performed at [facility], [address], [place], [place] [address]    Special Requests   Final    NONE Reflexed from [id number] Performed at [facility], [address], [place], [place] [address]    Gram Stain   Final    FEW WBC PRESENT,BOTH PMN AND MONONUCLEAR FEW GRAM POSITIVE COCCI IN PAIRS    Culture   Final    CULTURE REINCUBATED FOR BETTER GROWTH Performed at [facility], [address], [place], [place] [address]    Report Status PENDING  Incomplete  Respiratory (~20 pathogens) panel by PCR     Status: Abnormal   Collection Time: 01/31/23 11:53 PM   Specimen: Nasopharyngeal Swab; Respiratory  Result Value Ref Range Status   Adenovirus NOT DETECTED NOT DETECTED Final   Coronavirus 229E NOT DETECTED NOT DETECTED Final    Comment: (NOTE) The Coronavirus on the Respiratory Panel, DOES NOT test for the novel  Coronavirus (2019 nCoV)    Coronavirus HKU1 NOT DETECTED  NOT DETECTED Final   Coronavirus NL63 NOT DETECTED NOT DETECTED Final   Coronavirus OC43 NOT DETECTED NOT DETECTED Final   Metapneumovirus NOT DETECTED NOT DETECTED Final   Rhinovirus / Enterovirus DETECTED (A) NOT DETECTED Final   Influenza A NOT DETECTED NOT DETECTED Final   Influenza B NOT DETECTED NOT DETECTED Final   Parainfluenza Virus 1 NOT DETECTED NOT DETECTED Final   Parainfluenza Virus 2 NOT DETECTED NOT DETECTED Final   Parainfluenza Virus 3 NOT DETECTED NOT DETECTED Final   Parainfluenza Virus 4 NOT DETECTED NOT DETECTED Final   Respiratory Syncytial Virus NOT DETECTED NOT DETECTED Final   Bordetella pertussis NOT DETECTED NOT DETECTED Final   Bordetella Parapertussis NOT DETECTED NOT DETECTED Final   Chlamydophila pneumoniae NOT DETECTED NOT DETECTED Final   Mycoplasma pneumoniae NOT DETECTED NOT DETECTED Final    Comment: Performed at [facility], [address], [place], [place] [address]     Labs: BNP (last 3 results) Recent Labs    01/31/23 1850  BNP 240.7*   Basic Metabolic Panel: Recent Labs  Lab 01/31/23 1850 02/01/23 0431  NA 134* 135  K 3.9 4.7  CL 97* 100  CO2 27 26  GLUCOSE 91 180*  BUN <5* <5*  CREATININE 0.67 0.74  CALCIUM 8.6* 8.0*  MG 1.8  --    Liver Function Tests: Recent Labs  Lab 01/31/23 1850 02/01/23 0431  AST 17 16  ALT 13 12  ALKPHOS 174* 139*  BILITOT 0.3 0.3  PROT 6.7 6.1*  ALBUMIN 3.1* 2.7*   No results for input(s): "LIPASE", "AMYLASE" in the last 168 hours. No results for input(s): "AMMONIA" in the last 168 hours. CBC: Recent Labs  Lab 01/31/23 1850 02/01/23 0431  WBC 8.3 7.4  NEUTROABS 6.4  --  HGB 10.9* 10.1*  HCT 34.8* 32.5*  MCV 81.3 83.3  PLT 329 278   Cardiac Enzymes: No results for input(s): "CKTOTAL", "CKMB", "CKMBINDEX", "TROPONINI" in the last 168 hours. BNP: Invalid input(s): "POCBNP" CBG: No results for input(s): "GLUCAP" in the last 168 hours. D-Dimer No results for input(s):  "DDIMER" in the last 72 hours. Hgb A1c No results for input(s): "HGBA1C" in the last 72 hours. Lipid Profile No results for input(s): "CHOL", "HDL", "LDLCALC", "TRIG", "CHOLHDL", "LDLDIRECT" in the last 72 hours. Thyroid function studies No results for input(s): "TSH", "T4TOTAL", "T3FREE", "THYROIDAB" in the last 72 hours.  Invalid input(s): "FREET3" Anemia work up No results for input(s): "VITAMINB12", "FOLATE", "FERRITIN", "TIBC", "IRON", "RETICCTPCT" in the last 72 hours. Urinalysis    Component Value Date/Time   COLORURINE YELLOW 07/26/2022 1247   APPEARANCEUR TURBID (A) 07/26/2022 1247   LABSPEC 1.013 07/26/2022 1247   PHURINE 7.0 07/26/2022 1247   GLUCOSEU NEGATIVE 07/26/2022 1247   HGBUR SMALL (A) 07/26/2022 1247   BILIRUBINUR NEGATIVE 07/26/2022 1247   BILIRUBINUR negative 08/23/2017 0936   BILIRUBINUR NEG 12/22/2015 1410   KETONESUR NEGATIVE 07/26/2022 1247   PROTEINUR 30 (A) 07/26/2022 1247   UROBILINOGEN 0.2 08/23/2017 0936   UROBILINOGEN 1.0 03/09/2013 1013   NITRITE NEGATIVE 07/26/2022 1247   LEUKOCYTESUR MODERATE (A) 07/26/2022 1247   Sepsis Labs Recent Labs  Lab 01/31/23 1850 02/01/23 0431  WBC 8.3 7.4   Microbiology Recent Results (from the past 240 hour(s))  SARS Coronavirus 2 by RT PCR (hospital order, performed in [facility] hospital lab) *cepheid single result test* Anterior Nasal Swab     Status: None   Collection Time: 01/31/23  8:02 PM   Specimen: Anterior Nasal Swab  Result Value Ref Range Status   SARS Coronavirus 2 by RT PCR NEGATIVE NEGATIVE Final    Comment: (NOTE) SARS-CoV-2 target nucleic acids are NOT DETECTED.  The SARS-CoV-2 RNA is generally detectable in upper and lower respiratory specimens during the acute phase of infection. The lowest concentration of SARS-CoV-2 viral copies this assay can detect is 250 copies / mL. A negative result does not preclude SARS-CoV-2 infection and should not be used as the sole basis for treatment  or other patient management decisions.  A negative result may occur with improper specimen collection / handling, submission of specimen other than nasopharyngeal swab, presence of viral mutation(s) within the areas targeted by this assay, and inadequate number of viral copies (<250 copies / mL). A negative result must be combined with clinical observations, patient history, and epidemiological information.  Fact Sheet for Patients:   RoadLapTop.co.za  Fact Sheet for Healthcare Providers: http://kim-miller.com/  This test is not yet approved or  cleared by the [place] FDA and has been authorized for detection and/or diagnosis of SARS-CoV-2 by FDA under an Emergency Use Authorization (EUA).  This EUA will remain in effect (meaning this test can be used) for the duration of the COVID-19 declaration under Section 564(b)(1) of the Act, 21 U.S.C. section 360bbb-3(b)(1), unless the authorization is terminated or revoked sooner.  Performed at [facility], [address] [place], [place] [address]   Blood culture (routine x 2)     Status: None (Preliminary result)   Collection Time: 01/31/23  8:10 PM   Specimen: BLOOD  Result Value Ref Range Status   Specimen Description   Final    BLOOD BLOOD LEFT FOREARM Performed at [facility], [address], [place], [place] [address]  Special Requests   Final    BOTTLES DRAWN AEROBIC AND ANAEROBIC Blood Culture adequate volume Performed at [facility], [address], [place], [place] [address]    Culture   Final    NO GROWTH 2 DAYS Performed at [facility], [address] [place], [place] [address]    Report Status PENDING  Incomplete  Blood culture (routine x 2)     Status: None (Preliminary result)   Collection Time: 01/31/23  8:15 PM   Specimen: BLOOD  Result Value Ref Range Status   Specimen Description   Final     BLOOD BLOOD RIGHT FOREARM Performed at [facility], [address], [place], [place] [address]    Special Requests   Final    BOTTLES DRAWN AEROBIC AND ANAEROBIC Blood Culture adequate volume Performed at [facility], [address] [place], [place] [address]    Culture   Final    NO GROWTH 2 DAYS Performed at [facility], [address], [place], [place] [address]    Report Status PENDING  Incomplete  Expectorated Sputum Assessment w Gram Stain, Rflx to Resp Cult     Status: None   Collection Time: 01/31/23  8:24 PM   Specimen: Expectorated Sputum  Result Value Ref Range Status   Specimen Description EXPECTORATED SPUTUM  Final   Special Requests NONE  Final   Sputum evaluation   Final    THIS SPECIMEN IS ACCEPTABLE FOR SPUTUM CULTURE Performed at [facility], [address] [place], [place] [address]    Report Status 01/31/2023 FINAL  Final  Culture, Respiratory w Gram Stain     Status: None (Preliminary result)   Collection Time: 01/31/23  8:24 PM  Result Value Ref Range Status   Specimen Description   Final    EXPECTORATED SPUTUM Performed at [facility], [address], [place], [place] [address]    Special Requests   Final    NONE Reflexed from [id number] Performed at [facility], [address] [place], [place] [address]    Gram Stain   Final    FEW WBC PRESENT,BOTH PMN AND MONONUCLEAR FEW GRAM POSITIVE COCCI IN PAIRS    Culture   Final    CULTURE REINCUBATED FOR BETTER GROWTH Performed at [facility], [address] [place], [place] [address]    Report Status PENDING  Incomplete  Respiratory (~20 pathogens) panel by PCR     Status: Abnormal   Collection Time: 01/31/23 11:53 PM   Specimen: Nasopharyngeal Swab; Respiratory  Result Value Ref Range Status   Adenovirus NOT DETECTED NOT DETECTED Final   Coronavirus 229E NOT DETECTED NOT DETECTED Final     Comment: (NOTE) The Coronavirus on the Respiratory Panel, DOES NOT test for the novel  Coronavirus (2019 nCoV)    Coronavirus HKU1 NOT DETECTED NOT DETECTED Final   Coronavirus NL63 NOT DETECTED NOT DETECTED Final   Coronavirus OC43 NOT DETECTED NOT DETECTED Final   Metapneumovirus NOT DETECTED NOT DETECTED Final   Rhinovirus / Enterovirus DETECTED (A) NOT DETECTED Final   Influenza A NOT DETECTED NOT DETECTED Final   Influenza B NOT DETECTED NOT DETECTED Final   Parainfluenza Virus 1 NOT DETECTED NOT DETECTED Final   Parainfluenza Virus 2 NOT DETECTED NOT DETECTED Final   Parainfluenza Virus 3 NOT DETECTED NOT DETECTED Final   Parainfluenza Virus 4 NOT DETECTED NOT DETECTED Final  Respiratory Syncytial Virus NOT DETECTED NOT DETECTED Final   Bordetella pertussis NOT DETECTED NOT DETECTED Final   Bordetella Parapertussis NOT DETECTED NOT DETECTED Final   Chlamydophila pneumoniae NOT DETECTED NOT DETECTED Final   Mycoplasma pneumoniae NOT DETECTED NOT DETECTED Final    Comment: Performed at [facility], [address] [place], [place] [address]     Time coordinating discharge: 35 minutes  SIGNED:   Dorcas Carrow, MD  [facility] 02/03/2023, 8:40 AM

## 2023-02-04 ENCOUNTER — Telehealth: Payer: Self-pay

## 2023-02-04 ENCOUNTER — Telehealth: Payer: Self-pay | Admitting: Family Medicine

## 2023-02-04 DIAGNOSIS — J90 Pleural effusion, not elsewhere classified: Secondary | ICD-10-CM

## 2023-02-04 NOTE — Telephone Encounter (Signed)
Called patient and discussed results from hospital. LDH not drawn on pleural fluid but based on protein qualifies as exudative effusion. Urine Ag positive for Strep pneumo. As effusion was loculated and only small amount of fluid able to be drawn off, will refer to pulmonology for recommendations for further amangement and she is in agreement. She is taking augmentin. Breathing feels better. Discussed cytology did not show cancerous or abnormal cells. She states she knows she needs to stop smoking. She was appreciative of the call.  Burley Saver MD

## 2023-02-04 NOTE — Transitions of Care (Post Inpatient/ED Visit) (Signed)
02/04/2023  Name: Karen Casey MRN: [id number] DOB: 05/12/1956  Today's TOC FU Call Status: Today's TOC FU Call Status:: Successful TOC FU Call Completed TOC FU Call Complete Date: 02/04/23 Patient's Name and Date of Birth confirmed.  Transition Care Management Follow-up Telephone Call Date of Discharge: 02/03/23 Discharge Facility: [facility]) Type of Discharge: Inpatient Admission Primary Inpatient Discharge Diagnosis:: pleural effusion How have you been since you were released from the hospital?: Better Any questions or concerns?: No  Items Reviewed: Did you receive and understand the discharge instructions provided?: Yes Medications obtained,verified, and reconciled?: Yes (Medications Reviewed) Any new allergies since your discharge?: No Dietary orders reviewed?: Yes Do you have support at home?: Yes People in Home: friend(s)  Medications Reviewed Today: Medications Reviewed Today     Reviewed by [name], LPN (Licensed Practical Nurse) on 02/04/23 at 0831  Med List Status: <None>   Medication Order Taking? Sig Documenting Provider Last Dose Status Informant  albuterol (VENTOLIN HFA) 108 (90 Base) MCG/ACT inhaler [id number] No Inhale 2 puffs into the lungs every 6 (six) hours as needed for wheezing or shortness of breath. Billey Co, MD 01/31/2023 Active Self  amiodarone (PACERONE) 200 MG tablet [id number] No Take 1 tablet (200 mg total) by mouth daily. Lewanda Rife, MD 01/31/2023 Active Self  amoxicillin-clavulanate (AUGMENTIN) 875-125 MG tablet [id number]  Take 1 tablet by mouth 2 (two) times daily for 5 days. Dorcas Carrow, MD  Active   apixaban (ELIQUIS) 5 MG TABS tablet [id number] No Take 1 tablet (5 mg total) by mouth 2 (two) times daily. Lewanda Rife, MD 01/31/2023 0800 Active Self  busPIRone (BUSPAR) 5 MG tablet [id number] No Take 5 mg by mouth 2 (two) times daily. [provider] 01/31/2023 Active Self  cholecalciferol (VITAMIN D3)  25 MCG (1000 UNIT) tablet [id number] No Take 1,000 Units by mouth daily. [provider] 01/31/2023 Active Self  clonazePAM (KLONOPIN) 0.5 MG tablet [id number] No Take 1 tablet (0.5 mg total) by mouth 2 (two) times daily as needed for anxiety. Ok to fill 12/29/22 Billey Co, MD 01/31/2023 Active Self  escitalopram (LEXAPRO) 5 MG tablet [id number] No Take 3 tablets (15 mg total) by mouth daily. Billey Co, MD 01/31/2023 Active Self  ferrous sulfate 325 (65 FE) MG tablet [id number] No Take 1 tablet (325 mg total) by mouth every other day. Lewanda Rife, MD 01/30/2023 Active Self  Fluticasone-Umeclidin-Vilant (TRELEGY ELLIPTA) 100-62.5-25 MCG/ACT AEPB [id number] No Inhale 1 puff into the lungs daily. Billey Co, MD 01/31/2023 Active Self           Med Note Rosanne Ashing Nov 28, 2022  7:39 AM) Lf 10/29/22 #60  gabapentin (NEURONTIN) 400 MG capsule [id number] No Take 2 capsules (800 mg total) by mouth 3 (three) times daily. Billey Co, MD 01/31/2023 Active Self  guaiFENesin (MUCINEX) 600 MG 12 hr tablet [id number]  Take 1 tablet (600 mg total) by mouth 2 (two) times daily. Dorcas Carrow, MD  Active   guaiFENesin (ROBITUSSIN) 100 MG/5ML liquid [id number]  Take 10 mLs by mouth every 6 (six) hours as needed for cough or to loosen phlegm. Dorcas Carrow, MD  Active   Discontinued 10/29/22 1104   hydrOXYzine (ATARAX) 25 MG tablet [id number] No Take 1 tablet (25 mg total) by mouth 3 (three) times daily as needed for anxiety. Lewanda Rife, MD 01/31/2023 Active Self  Multiple Vitamins-Minerals (CENTRUM ADULT PO) [id number] No Take 1 tablet by mouth daily. [provider]  01/31/2023 Active Self  nicotine (NICODERM CQ - DOSED IN MG/24 HOURS) 21 mg/24hr patch [id number]  Place 1 patch (21 mg total) onto the skin daily. Dorcas Carrow, MD  Active   pantoprazole (PROTONIX) 40 MG tablet [id number] No Take 1 tablet (40 mg total) by mouth daily. Lewanda Rife, MD 01/31/2023 Active  Self  traZODone (DESYREL) 50 MG tablet [id number] No Take 1 tablet (50 mg total) by mouth at bedtime as needed for sleep.  Patient taking differently: Take 50 mg by mouth at bedtime.   Lewanda Rife, MD 01/30/2023 Active Self  Vitamin D, Ergocalciferol, (DRISDOL) 1.25 MG (50000 UNIT) CAPS capsule [id number] No Take 1 capsule (50,000 Units total) by mouth every 7 (seven) days.  Patient not taking: Reported on 01/31/2023   Lewanda Rife, MD Not Taking Active Self            Home Care and Equipment/Supplies: Were Home Health Services Ordered?: NA Any new equipment or medical supplies ordered?: NA  Functional Questionnaire: Do you need assistance with bathing/showering or dressing?: No Do you need assistance with meal preparation?: No Do you need assistance with eating?: No Do you have difficulty maintaining continence: No Do you need assistance with getting out of bed/getting out of a chair/moving?: No Do you have difficulty managing or taking your medications?: No  Follow up appointments reviewed: PCP Follow-up appointment confirmed?: Yes Date of PCP follow-up appointment?: 02/08/23 Follow-up Provider: [facility] Follow-up appointment confirmed?: NA Do you need transportation to your follow-up appointment?: No Do you understand care options if your condition(s) worsen?: Yes-patient verbalized understanding    SIGNATURE [name], LPN [facility] Nurse Health Advisor Direct Dial [phone]

## 2023-02-04 NOTE — Telephone Encounter (Signed)
Called pt, see phone note

## 2023-02-04 NOTE — Telephone Encounter (Signed)
Patient calling nurse line to ask if Dr Miquel Dunn is in the office today. Patient would like for Dr Miquel Dunn to call her after she sees the patients. Patient worried about results. Trust Dr Miquel Dunn.  Please call pt when able to do so.  Karen Casey, CMA

## 2023-02-06 LAB — CULTURE, BLOOD (ROUTINE X 2)
Culture: NO GROWTH
Culture: NO GROWTH
Special Requests: ADEQUATE
Special Requests: ADEQUATE

## 2023-02-07 ENCOUNTER — Other Ambulatory Visit: Payer: Self-pay | Admitting: Family Medicine

## 2023-02-07 ENCOUNTER — Other Ambulatory Visit: Payer: Self-pay

## 2023-02-08 ENCOUNTER — Encounter: Payer: Self-pay | Admitting: Family Medicine

## 2023-02-08 ENCOUNTER — Ambulatory Visit (INDEPENDENT_AMBULATORY_CARE_PROVIDER_SITE_OTHER): Payer: Medicare PPO | Admitting: Family Medicine

## 2023-02-08 ENCOUNTER — Telehealth: Payer: Self-pay | Admitting: Family Medicine

## 2023-02-08 ENCOUNTER — Telehealth: Payer: Self-pay

## 2023-02-08 VITALS — BP 137/67 | HR 96 | Temp 98.2°F | Wt 143.8 lb

## 2023-02-08 DIAGNOSIS — E041 Nontoxic single thyroid nodule: Secondary | ICD-10-CM

## 2023-02-08 DIAGNOSIS — D649 Anemia, unspecified: Secondary | ICD-10-CM | POA: Diagnosis not present

## 2023-02-08 DIAGNOSIS — R918 Other nonspecific abnormal finding of lung field: Secondary | ICD-10-CM

## 2023-02-08 DIAGNOSIS — J189 Pneumonia, unspecified organism: Secondary | ICD-10-CM

## 2023-02-08 DIAGNOSIS — I10 Essential (primary) hypertension: Secondary | ICD-10-CM | POA: Diagnosis not present

## 2023-02-08 DIAGNOSIS — J90 Pleural effusion, not elsewhere classified: Secondary | ICD-10-CM

## 2023-02-08 DIAGNOSIS — S22000A Wedge compression fracture of unspecified thoracic vertebra, initial encounter for closed fracture: Secondary | ICD-10-CM

## 2023-02-08 NOTE — Assessment & Plan Note (Addendum)
Pt finishing Augmentin today, still feeling fatigued and tired from hospital stay Reassuringly, no increased WOB or measured fevers at home, but pleural effusion was loculated and we discussed my concern that it could have residual infection if not completely drained Called pulmonology office, they are able to schedule pt next week for new patient visit, called pt and left VM Discussed strict ED precautions including worsening fevers or chills once completing the antibiotics, worsening cough, shortness of breath, chest pain Will repeat CBC/BMP today per hospital DC summary

## 2023-02-08 NOTE — Assessment & Plan Note (Signed)
Discussed nodules noted on CT chest, discussed pulm referral as above, recommendations of repeat CT chest in 3 months per radiology to ensure resolution if related to infection

## 2023-02-08 NOTE — Progress Notes (Signed)
    SUBJECTIVE:   CHIEF COMPLAINT / HPI:   Hospital f/u for pneumonia- she has finished taking her Augmentin, last dose is this evening. Having diarrhea from it, taking immodium for that. Recommend repeat CT chest in one-three months. Has already been referred to pulmonology. Denies measured fevers but notes still feels chilly at home, still coughing up some phlegm, no blood. Feels tired and not sure if she feels any better since leaving the hospital. No chest pain.  Chronic T6 fracture- discussed with neurosurgery, could consider referral for bracing. No new weakness or numbness or bowel or bladder incontinence. She notes still feeling peripheral neuropathy.    OBJECTIVE:   BP 137/67   Pulse 96   Temp 98.2 F (36.8 C)   Wt 143 lb 12.8 oz (65.2 kg)   SpO2 96%   BMI 24.68 kg/m   General: A&O, NAD HEENT: No sign of trauma, EOM grossly intact Cardiac: RRR, no m/r/g Respiratory: decreased breath sounds in RLL, speaking in complete sentences, normal WOB, no w/c/r GI: Soft, NTTP, non-distended  Extremities: NTTP, no peripheral edema. Neuro: walks with walker, moves all four extremities appropriately. Psych: Appropriate mood and affect   ASSESSMENT/PLAN:   Multifocal pneumonia Pt finishing Augmentin today, still feeling fatigued and tired from hospital stay Reassuringly, no increased WOB or measured fevers at home, but pleural effusion was loculated and we discussed my concern that it could have residual infection if not completely drained Called pulmonology office, they are able to schedule pt next week for new patient visit, called pt and left VM Discussed strict ED precautions including worsening fevers or chills once completing the antibiotics, worsening cough, shortness of breath, chest pain Will repeat CBC/BMP today per hospital DC summary  Pleural effusion on right Appt next week with pulmonology as above, negative cytology results from hospital, but loculated and  transudative so appreciate pulmonology recs for further managment  Pulmonary nodules/lesions, multiple Discussed nodules noted on CT chest, discussed pulm referral as above, recommendations of repeat CT chest in 3 months per radiology to ensure resolution if related to infection  Compression fracture of body of thoracic vertebra [facility]) Discussed CT results showing stable and that I discussed with neurosurgery PA who noted without neurologic changes no acute surgical intervention indicated, did discuss could be a candidate for bracing and offered neurosurgery referral which patient declines at this time     Billey Co, MD [facility]

## 2023-02-08 NOTE — Telephone Encounter (Signed)
Patient calls nurse line requesting to speak with PCP.   She reports she does not feel any better than she did when she went into the hospital.   She reports she has no appetite and reports low energy. She reports she is unsure if she should go back to the ED.   Patient denies any fevers, SOB or chest pains at this time.   Patient stated "another call is coming in and I need to get it, have Dr. Miquel Dunn call me tomorrow when my labs come back."  Will forward to PCP.

## 2023-02-08 NOTE — Assessment & Plan Note (Addendum)
Appt next week with pulmonology as above, negative cytology results from hospital, but loculated and transudative so appreciate pulmonology recs for further managment

## 2023-02-08 NOTE — Assessment & Plan Note (Signed)
Discussed CT results showing stable and that I discussed with neurosurgery PA who noted without neurologic changes no acute surgical intervention indicated, did discuss could be a candidate for bracing and offered neurosurgery referral which patient declines at this time

## 2023-02-08 NOTE — Patient Instructions (Addendum)
It was wonderful to see you today.  Please bring ALL of your medications with you to every visit.   Today we talked about:  We are rechecking your lab work from the hospital  We have already referred you to a pulmonologist (lung doctor). We will need to repeat the CT of your chest in three months  It also showed a nodule on your thyroid - we will schedule an [place] of your thyroid to check on this and check your thyroid function today.  Thank you for choosing [facility].   Please call [phone] with any questions about today's appointment.  Please arrive at least 15 minutes prior to your scheduled appointments.   If you had blood work today, I will send you a MyChart message or a letter if results are normal. Otherwise, I will give you a call.   If you had a referral placed, they will call you to set up an appointment. Please give [place] a call if you don't hear back in the next 2 weeks.   If you need additional refills before your next appointment, please call your pharmacy first.   Burley Saver, MD  Family Medicine

## 2023-02-08 NOTE — Telephone Encounter (Signed)
Called patient, left VM. Discussed with pulmonolgy clinic and made her an appointment next week 02/16/23 at 3:30 pm at [facility].  Left clinic number to call back if question- if patient calls back please give date, time, location of pulmonology appointment.  Burley Saver MD

## 2023-02-09 ENCOUNTER — Telehealth: Payer: Self-pay | Admitting: Family Medicine

## 2023-02-09 LAB — BASIC METABOLIC PANEL
BUN/Creatinine Ratio: 6 — ABNORMAL LOW (ref 12–28)
BUN: 4 mg/dL — ABNORMAL LOW (ref 8–27)
CO2: 22 mmol/L (ref 20–29)
Calcium: 7.8 mg/dL — ABNORMAL LOW (ref 8.7–10.3)
Chloride: 102 mmol/L (ref 96–106)
Creatinine, Ser: 0.69 mg/dL (ref 0.57–1.00)
Glucose: 104 mg/dL — ABNORMAL HIGH (ref 70–99)
Potassium: 4.4 mmol/L (ref 3.5–5.2)
Sodium: 137 mmol/L (ref 134–144)
eGFR: 97 mL/min/{1.73_m2} (ref >59)

## 2023-02-09 LAB — CBC
Hematocrit: 34.3 % (ref 34.0–46.6)
Hemoglobin: 10.9 g/dL — ABNORMAL LOW (ref 11.1–15.9)
MCH: 26 pg — ABNORMAL LOW (ref 26.6–33.0)
MCHC: 31.8 g/dL (ref 31.5–35.7)
MCV: 82 fL (ref 79–97)
Platelets: 427 10*3/uL (ref 150–450)
RBC: 4.2 x10E6/uL (ref 3.77–5.28)
RDW: 19.6 % — ABNORMAL HIGH (ref 11.7–15.4)
WBC: 8.4 10*3/uL (ref 3.4–10.8)

## 2023-02-09 LAB — T4F: T4,Free (Direct): 0.91 ng/dL (ref 0.82–1.77)

## 2023-02-09 LAB — TSH RFX ON ABNORMAL TO FREE T4: TSH: 4.95 u[IU]/mL — ABNORMAL HIGH (ref 0.450–4.500)

## 2023-02-09 NOTE — Telephone Encounter (Signed)
Called patient to discuss lab results. Picked up but unable to hear patient x3.  On fourth call, able to connect with patient.   Discussed TSH mildly elevated, but free T4 within normal limits. Discussed [place] scheduled for next week and importance to characterize nodule. She notes "I'm not sure I will dothat next week." Reiterated that it would be important for her to get this ultrasound.   Asked how her breathing is feeling- she notes cough is improved, denies shortness of breath, denies fevers or chills, notes just feeling tired and weak. Discussed other labs results - Hgb stable, mildly low calcium for which she takes supplementation. Given date and time of pulmonology appt on 02/16/23. She has finished her Augmentin. Discussed if new fevers or chills, shortness of breath, or worsening cough to let us know.  Burley Saver MD

## 2023-02-11 ENCOUNTER — Other Ambulatory Visit: Payer: Self-pay | Admitting: Family Medicine

## 2023-02-11 ENCOUNTER — Ambulatory Visit: Payer: Medicare PPO | Admitting: Licensed Clinical Social Worker

## 2023-02-11 MED ORDER — ESCITALOPRAM OXALATE 20 MG PO TABS: 20.0000 mg | ORAL_TABLET | Freq: Every day | ORAL | Status: DC

## 2023-02-11 MED ORDER — HYDROXYZINE HCL 50 MG PO TABS: 50.0000 mg | ORAL_TABLET | Freq: Three times a day (TID) | ORAL | Status: DC | PRN

## 2023-02-11 MED ORDER — DENOSUMAB 60 MG/ML ~~LOC~~ SOSY: 60.0000 mg | PREFILLED_SYRINGE | SUBCUTANEOUS | Status: DC

## 2023-02-11 NOTE — Progress Notes (Signed)
Med rec updated per home health records.

## 2023-02-15 ENCOUNTER — Ambulatory Visit (HOSPITAL_COMMUNITY): Admission: RE | Admit: 2023-02-15 | Payer: Medicare PPO | Source: Ambulatory Visit

## 2023-02-16 ENCOUNTER — Institutional Professional Consult (permissible substitution): Payer: Medicare PPO | Admitting: Pulmonary Disease

## 2023-02-16 IMAGING — MG DIGITAL DIAGNOSTIC BILAT W/ TOMO W/ CAD
8 series · 8 of 24 positions shown · non-contrast
Comparison: None available.

ACR Breast Density Category a: The breast tissue is almost entirely
fatty.

CLINICAL DATA: 68-year-old female with intermittent nonfocal pain
involving the mid chest just inferior to the inframammary folds
bilaterally.

EXAM:
DIGITAL DIAGNOSTIC BILATERAL MAMMOGRAM WITH TOMOSYNTHESIS AND CAD
TECHNIQUE: Bilateral digital diagnostic mammography and breast tomosynthesis
was performed. The images were evaluated with computer-aided
detection.

[L MLO synth-2D]
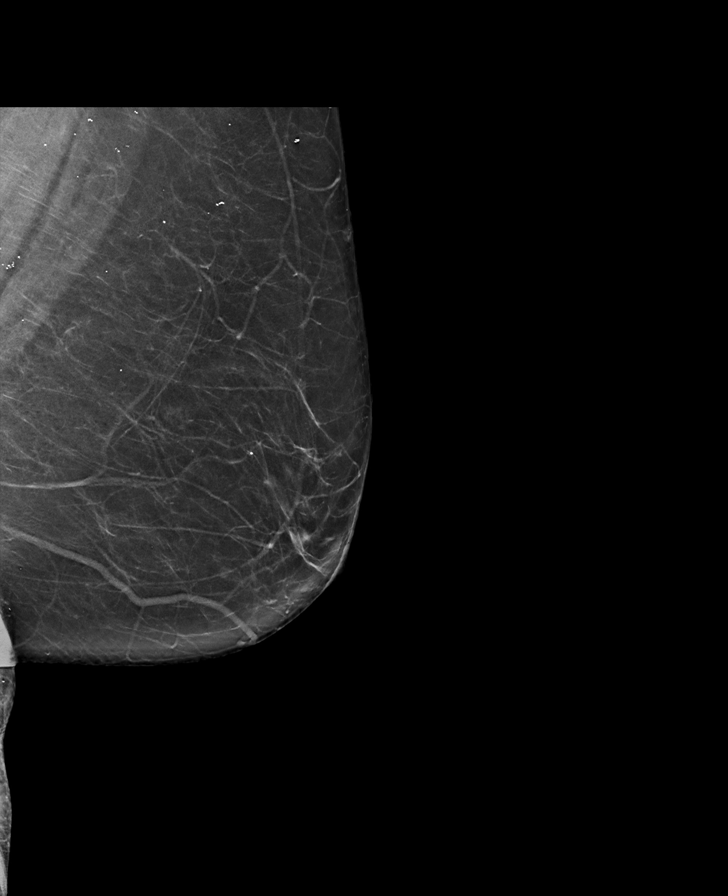

[R CC synth-2D]
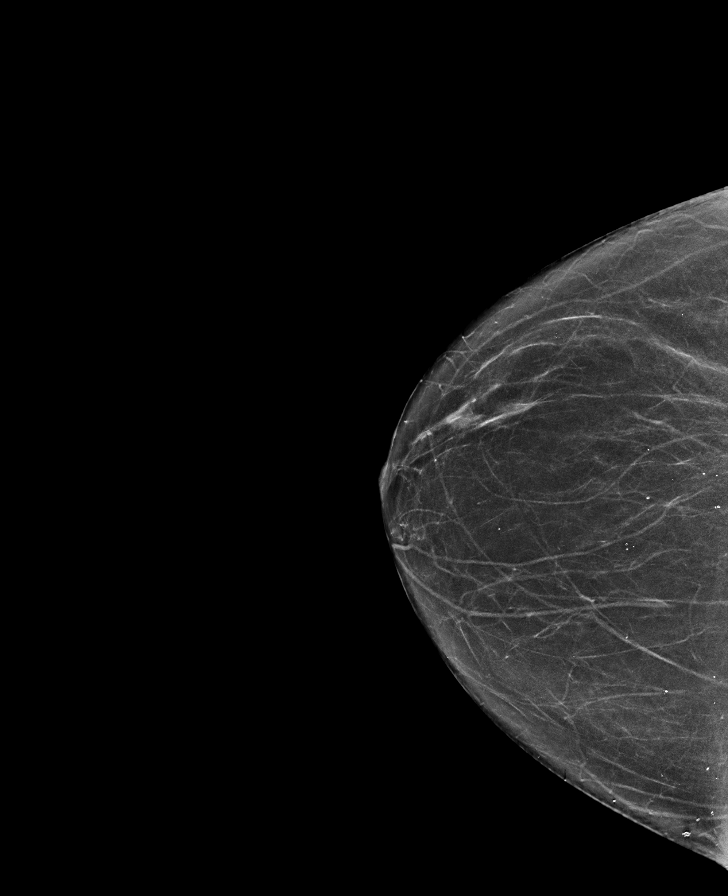

[R MLO synth-2D]
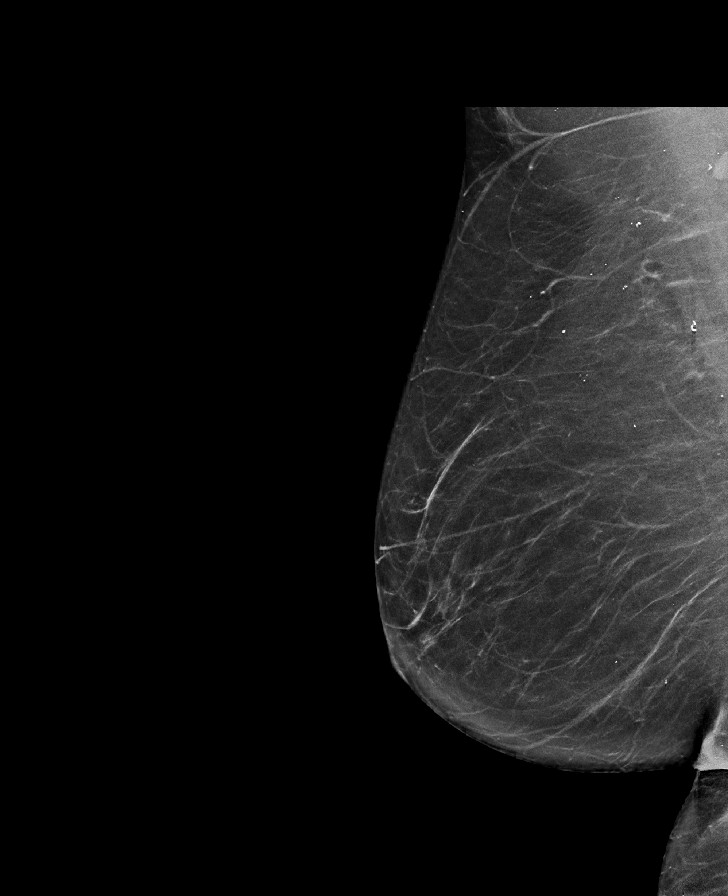

[L CC synth-2D]
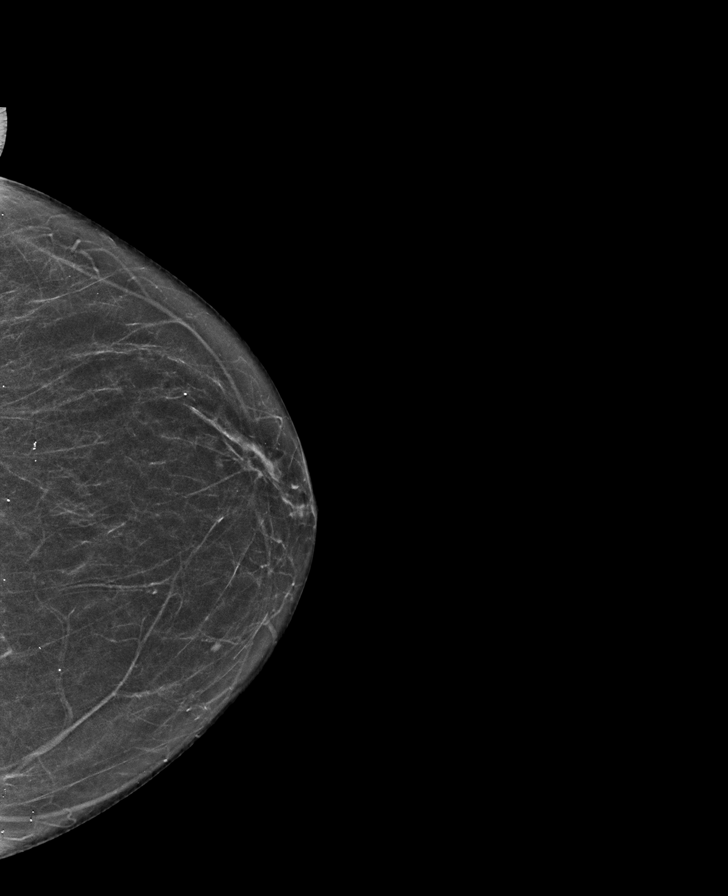

[R CC tomo · tomo slice 33/65.0]
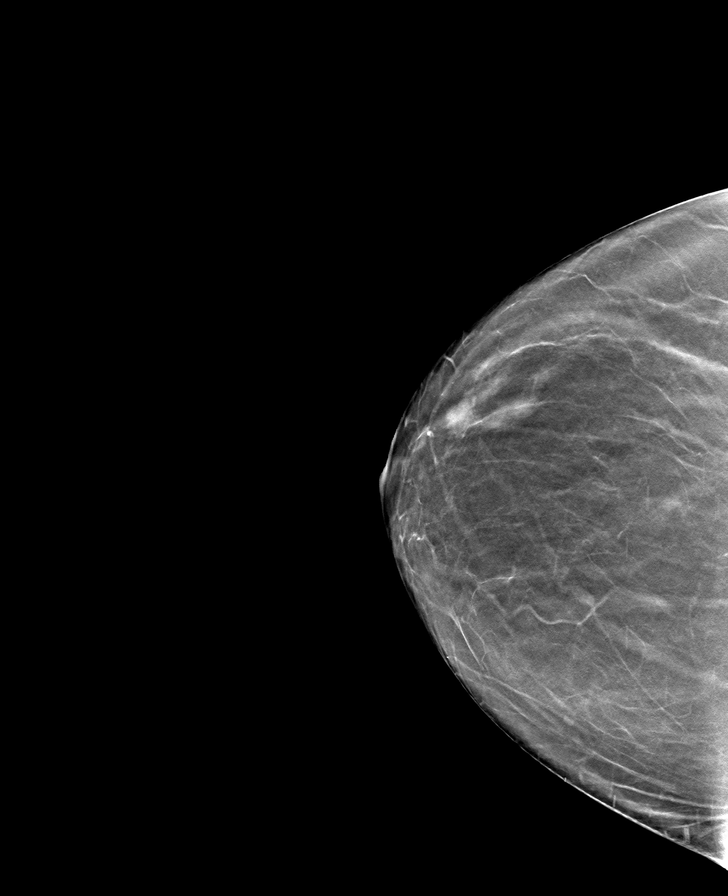

[R MLO tomo · tomo slice 35/70.0]
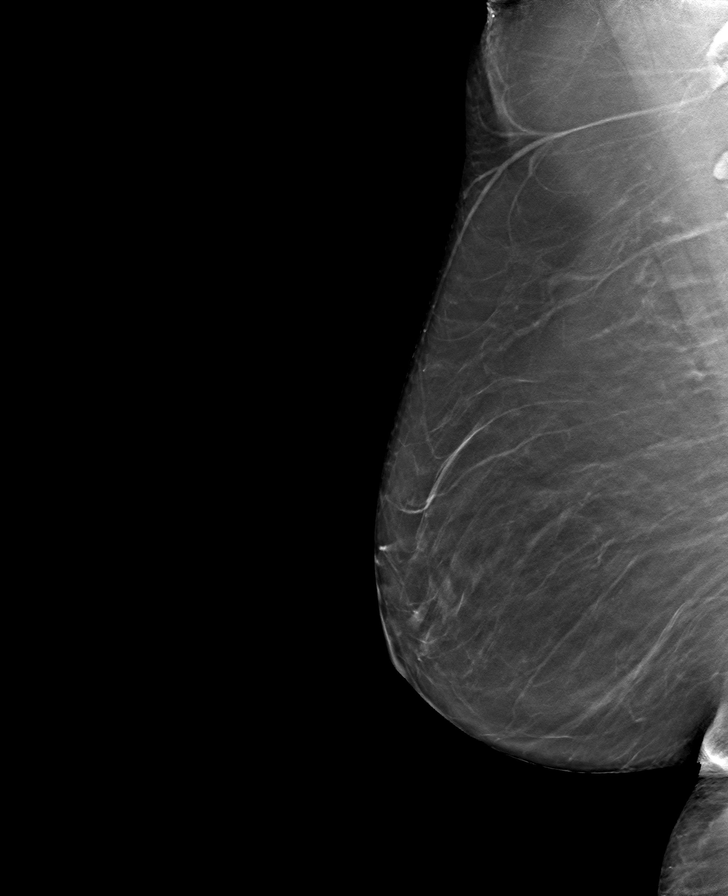

[L CC tomo · tomo slice 29/58.0]
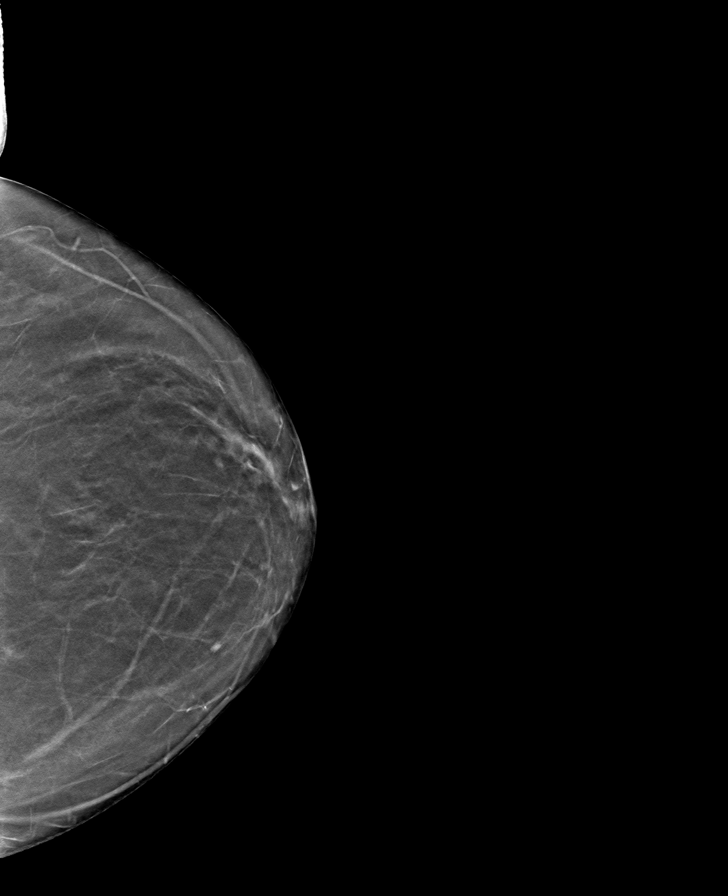

[L MLO tomo · tomo slice 37/72.0]
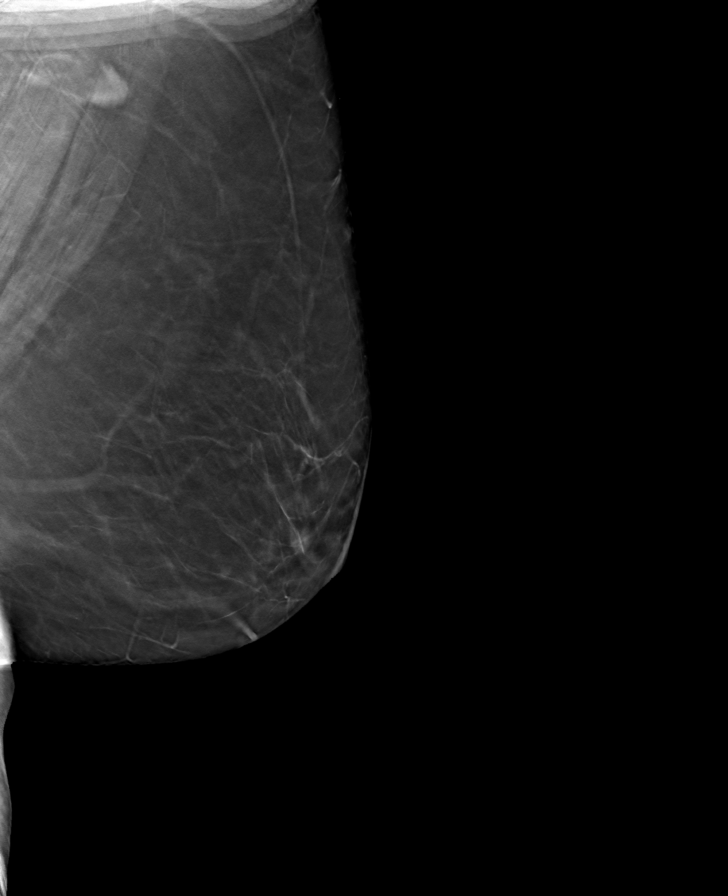

[8 of 24 positions shown; findings below may reference images not displayed]

FINDINGS: No suspicious masses or calcifications seen in either breast. There
is no mammographic evidence of malignancy in either breast.
IMPRESSION: No mammographic evidence of malignancy in either breast.

RECOMMENDATION:
1. Recommend intermittent nonfocal pain located along the inferior
margin of the bilateral breasts be based on clinical assessment.

2.  Screening mammogram in one year.(Code:[id number])

I have discussed the findings and recommendations with the patient.
If applicable, a reminder letter will be sent to the patient
regarding the next appointment.

BI-RADS CATEGORY  1: Negative.

## 2023-02-18 ENCOUNTER — Telehealth: Payer: Self-pay

## 2023-02-18 NOTE — Telephone Encounter (Signed)
Patient calls nurse line requesting to speak with PCP.   She reports she is very weak, she reports fatigue, low appetite, yellowish phlegm and back pain.   She denies any fevers or SOB. She was speaking in full sentences during our call.   She reports she did not feel like going to her pulmonology apt on 9/4 and reports she rescheduled for 9/19.  She is requesting a round of antibiotics at this time.   ED precautions discussed with patient.   Will forward to PCP.

## 2023-02-18 NOTE — Telephone Encounter (Signed)
Called patient, discussed that pulmonology appt was important to go to and she notes she missed bc she feels like she just can't get out of bed. She notes continued sputum productino, no measured fevers but some chills, and worsening back pain. She notes this feels similar to when she went to the hospital like last time.  Discussed my concern that if her effusion continues to grow and is still infected bc it was noted to be loculated on hospital and US exams at [facility] long, that outpatient PO antibiotics is not appropriate treatment. Discussed ED evaluation and possible admission with pulmonology consult and she was in agreement with this plan. She notes she will discuss with her sister and planning to go to [facility] per patient preference. I reiterated importance of being seen today as I worry about worsening complicated pleural effusion and unfortunately she cancelled the appt I scheduled for her this week with pulmonology to reassess her effusion.  Pt appreciative of the call, and planning to go to ED for evalaution.  Burley Saver MD

## 2023-03-03 ENCOUNTER — Encounter: Payer: Self-pay | Admitting: Emergency Medicine

## 2023-03-03 ENCOUNTER — Ambulatory Visit (INDEPENDENT_AMBULATORY_CARE_PROVIDER_SITE_OTHER): Payer: Medicare PPO | Admitting: Emergency Medicine

## 2023-03-03 ENCOUNTER — Ambulatory Visit: Payer: Medicare PPO

## 2023-03-03 VITALS — BP 110/72 | HR 83 | Resp 16 | Ht 64.0 in | Wt 145.4 lb

## 2023-03-03 DIAGNOSIS — J90 Pleural effusion, not elsewhere classified: Secondary | ICD-10-CM

## 2023-03-03 DIAGNOSIS — F17210 Nicotine dependence, cigarettes, uncomplicated: Secondary | ICD-10-CM | POA: Diagnosis not present

## 2023-03-03 DIAGNOSIS — Z720 Tobacco use: Secondary | ICD-10-CM

## 2023-03-03 DIAGNOSIS — J9811 Atelectasis: Secondary | ICD-10-CM | POA: Diagnosis not present

## 2023-03-03 DIAGNOSIS — J449 Chronic obstructive pulmonary disease, unspecified: Secondary | ICD-10-CM

## 2023-03-03 DIAGNOSIS — R918 Other nonspecific abnormal finding of lung field: Secondary | ICD-10-CM | POA: Diagnosis not present

## 2023-03-03 DIAGNOSIS — J189 Pneumonia, unspecified organism: Secondary | ICD-10-CM

## 2023-03-03 NOTE — Assessment & Plan Note (Signed)
Discussed cessation with her today.  She still smoking 10 cigarettes daily, does not feel that she can stop at this time.  We talked about decreasing slowly as an interim goal.  Will get her back to the lung cancer screening program going forward.

## 2023-03-03 NOTE — Progress Notes (Signed)
Subjective:    Patient ID: Karen Casey, female    DOB: 01/14/1956, 67 y.o.   MRN: [id number]  HPI 67 year old woman with a history of tobacco use (70+ pack years), COPD and chronic hypoxemic respiratory failure, benzodiazepine use due to anxiety, atrial fibrillation, hypertension.  She was admitted in late August 2024 with a multifocal right-sided pneumonia and associated parapneumonic effusion.  She was treated with ceftriaxone and azithromycin, briefly corticosteroids.  Urinary pneumococcal antigen positive.  She underwent a right thoracentesis on 02/01/2023 with 110 cc of bloody fluid obtained. She has participated in the lung CA screening program in the past.  She is still having some mucous production, often in the am. Manson Passey / Chilton Si. She has mid back pain. Her breathing is improving. She is on trelegy, rare albuterol use.   CT chest 01/28/2023 reviewed by me showed borderline cardiomegaly, moderate layering right pleural effusion, faint patchy groundglass opacities in the right middle lobe and upper lobe  Pleural fluid 02/01/2023 >> cytology negative, lymphocyte predominant.  Total protein 3.3 (serum 6.1), glucose135.  LDH was not done   Review of Systems As per HPI  Past Medical History:  Diagnosis Date   Alcohol abuse    last use 03/09/21, marijuana last 03/09/21   Allergy    Anxiety    Cataract 06/09/2012   Right eye and left eye   COPD (chronic obstructive pulmonary disease) (HCC)    Depression    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/04/2020   Due to benzodiazepine withdrawal   Dyspnea    uses oxygen 2L via [place] prn   Enterococcal bacteremia 02/10/2022   GERD (gastroesophageal reflux disease)    Headache    Hypertension    Long term (current) use of anticoagulants    Nausea vomiting and diarrhea 02/09/2022   Neuromuscular disorder (HCC)    Neuropathy    Paroxysmal atrial fibrillation (HCC)    Positive blood culture 02/10/2022   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   Event occurred in 2007   Seizure (HCC)    08/2020 per patient   Seizures (HCC)    xanax withdrawl- December 2013   Urinary incontinence 06/09/2012     Family History  Problem Relation Age of Onset   Hypertension Mother    Hyperlipidemia Mother    Aneurysm Mother        Rupture - Cause of death   Heart disease Father        MI - cause of death   Depression Father    Parkinsonism Father    Hypertension Sister    Colon cancer Neg Hx    Esophageal cancer Neg Hx    Rectal cancer Neg Hx    Stomach cancer Neg Hx      Social History   Socioeconomic History   Marital status: Single    Spouse name: Not on file   Number of children: 1   Years of education: Not on file   Highest education level: Not on file  Occupational History   Not on file  Tobacco Use   Smoking status: Every Day    Current packs/day: 0.50    Average packs/day: 0.5 packs/day for 43.0 years (21.5 ttl pk-yrs)    Types: Cigarettes    Passive exposure: Current   Smokeless tobacco: Never   Tobacco comments:    Tobacco info given  Vaping Use   Vaping status: Never Used  Substance and Sexual Activity   Alcohol use: Yes  Alcohol/week: 3.0 standard drinks of alcohol    Types: 3 Cans of beer per week    Comment: 1-2 times week just beer   Drug use: Yes    Types: Marijuana, Other-see comments    Comment: Past hx of benzo abuse--xanax   Sexual activity: Not on file  Other Topics Concern   Not on file  Social History Narrative   Not on file   Social Determinants of Health   Financial Resource Strain: Medium Risk (10/23/2022)   Overall Financial Resource Strain (CARDIA)    Difficulty of Paying Living Expenses: Somewhat hard  Food Insecurity: No Food Insecurity (01/31/2023)   Hunger Vital Sign    Worried About Running Out of Food in the Last Year: Never true    Ran Out of Food in the Last Year: Never true  Transportation Needs: Unmet Transportation Needs (02/02/2023)   PRAPARE -  Transportation    Lack of Transportation (Medical): Yes    Lack of Transportation (Non-Medical): Yes  Physical Activity: Inactive (10/23/2022)   Exercise Vital Sign    Days of Exercise per Week: 0 days    Minutes of Exercise per Session: 0 min  Stress: Stress Concern Present (10/23/2022)   [employer] of Occupational Health - Occupational Stress Questionnaire    Feeling of Stress : Very much  Social Connections: Socially Isolated (10/23/2022)   Social Connection and Isolation Panel [NHANES]    Frequency of Communication with Friends and Family: Three times a week    Frequency of Social Gatherings with Friends and Family: Once a week    Attends Religious Services: Never    Database administrator or Organizations: No    Attends Banker Meetings: Never    Marital Status: Widowed  Intimate Partner Violence: Not At Risk (01/31/2023)   Humiliation, Afraid, Rape, and Kick questionnaire    Fear of Current or Ex-Partner: No    Emotionally Abused: No    Physically Abused: No    Sexually Abused: No     Allergies  Allergen Reactions   Capsaicin-Menthol Rash and Other (See Comments)    Burning and peeling on area applied to   Diclo Gel [Diclofenac Sodium] Rash and Other (See Comments)    Burning and peeling at the sight of application.     Outpatient Medications Prior to Visit  Medication Sig Dispense Refill   albuterol (VENTOLIN HFA) 108 (90 Base) MCG/ACT inhaler Inhale 2 puffs into the lungs every 6 (six) hours as needed for wheezing or shortness of breath. 1 each 2   amiodarone (PACERONE) 200 MG tablet Take 1 tablet (200 mg total) by mouth daily. 30 tablet 0   apixaban (ELIQUIS) 5 MG TABS tablet Take 1 tablet (5 mg total) by mouth 2 (two) times daily. 60 tablet 0   busPIRone (BUSPAR) 5 MG tablet Take 5 mg by mouth 2 (two) times daily.     clonazePAM (KLONOPIN) 0.5 MG tablet Take 1 tablet (0.5 mg total) by mouth 2 (two) times daily as needed for anxiety. Ok to fill 12/29/22  16 tablet 0   denosumab (PROLIA) 60 MG/ML SOSY injection Inject 60 mg into the skin every 6 (six) months.     escitalopram (LEXAPRO) 20 MG tablet Take 1 tablet (20 mg total) by mouth daily.     ferrous sulfate 325 (65 FE) MG tablet Take 1 tablet (325 mg total) by mouth every other day. 60 tablet 1   Fluticasone-Umeclidin-Vilant (TRELEGY ELLIPTA) 100-62.5-25 MCG/ACT AEPB Inhale 1 puff  into the lungs daily. 28 each 1   gabapentin (NEURONTIN) 400 MG capsule TAKE 2 CAPSULES BY MOUTH THREE TIMES DAILY 180 capsule 0   guaiFENesin (ROBITUSSIN) 100 MG/5ML liquid Take 10 mLs by mouth every 6 (six) hours as needed for cough or to loosen phlegm. 120 mL 0   Multiple Vitamins-Minerals (CENTRUM ADULT PO) Take 1 tablet by mouth daily.     OXcarbazepine (TRILEPTAL) 150 MG tablet Take 150 mg by mouth 2 (two) times daily.     pantoprazole (PROTONIX) 40 MG tablet Take 1 tablet (40 mg total) by mouth daily. 30 tablet 0   traZODone (DESYREL) 50 MG tablet Take 1 tablet (50 mg total) by mouth at bedtime as needed for sleep. (Patient taking differently: Take 50 mg by mouth at bedtime.) 30 tablet 0   cholecalciferol (VITAMIN D3) 25 MCG (1000 UNIT) tablet Take 1,000 Units by mouth daily. (Patient not taking: Reported on 03/03/2023)     hydrOXYzine (ATARAX) 50 MG tablet Take 1 tablet (50 mg total) by mouth 3 (three) times daily as needed. (Patient not taking: Reported on 03/03/2023)     nicotine (NICODERM CQ - DOSED IN MG/24 HOURS) 21 mg/24hr patch Place 1 patch (21 mg total) onto the skin daily. (Patient not taking: Reported on 03/03/2023) 28 patch 0   No facility-administered medications prior to visit.        Objective:   Physical Exam Vitals:   03/03/23 1036  BP: 110/72  Pulse: 83  Resp: 16  SpO2: 98%  Weight: 145 lb 6.4 oz (66 kg)  Height: 5\' 4"  (1.626 m)   Gen: Pleasant, overweight woman, in no distress,  normal affect  ENT: No lesions,  mouth clear,  oropharynx clear, no postnasal drip  Neck: No JVD, no  stridor  Lungs: No use of accessory muscles, distant, somewhat decreased on the right, clear on the left.  No wheezing.  Cardiovascular: RRR, heart sounds normal, no murmur or gallops, no peripheral edema  Musculoskeletal: No deformities, no cyanosis or clubbing  Neuro: alert, awake, non focal  Skin: Warm, no lesions or rash      Assessment & Plan:  Pleural effusion on right Hospitalization for recent right community-acquired pneumonia, suspected pneumococcal pneumonia with associated pleural effusion.  Lymphocyte predominant, protein ratio 0.53.  We will repeat her chest x-ray now to ensure no reaccumulation.  If so then we can consider repeat thoracentesis.  Multifocal pneumonia Following clinically for improvement.  She is better.  Will repeat her chest x-ray today.  Did discuss with her that it will probably be a few months before she is fully over this.  COPD (chronic obstructive pulmonary disease) (HCC) Plan to continue Trelegy, albuterol as needed  Tobacco use Discussed cessation with her today.  She still smoking 10 cigarettes daily, does not feel that she can stop at this time.  We talked about decreasing slowly as an interim goal.  Will get her back to the lung cancer screening program going forward.   Levy Pupa, MD, PhD 03/03/2023, 11:25 AM [facility] [phone] or if no answer before 7:00PM call [phone] For any issues after 7:00PM please call eLink [phone]

## 2023-03-03 NOTE — Assessment & Plan Note (Signed)
Hospitalization for recent right community-acquired pneumonia, suspected pneumococcal pneumonia with associated pleural effusion.  Lymphocyte predominant, protein ratio 0.53.  We will repeat her chest x-ray now to ensure no reaccumulation.  If so then we can consider repeat thoracentesis.

## 2023-03-03 NOTE — Assessment & Plan Note (Signed)
Plan to continue Trelegy, albuterol as needed.

## 2023-03-03 NOTE — Patient Instructions (Addendum)
We reviewed your CT scan of the chest that was done during a recent hospitalization We will perform a chest x-ray today.  If there is increasing pleural fluid present then we can consider repeat drainage. Please continue your Trelegy 1 inhalation once daily.  Rinse and gargle after using. Keep your albuterol available to use 2 puffs when needed for shortness of breath, chest tightness, wheezing. Work on decreasing your cigarettes if possible.  We will continue to work on this going forward Follow Dr. Delton Coombes in 1 month or next available so we can review your status and your chest x-ray

## 2023-03-03 NOTE — Assessment & Plan Note (Signed)
Following clinically for improvement.  She is better.  Will repeat her chest x-ray today.  Did discuss with her that it will probably be a few months before she is fully over this.

## 2023-03-04 ENCOUNTER — Ambulatory Visit (INDEPENDENT_AMBULATORY_CARE_PROVIDER_SITE_OTHER): Payer: Medicare PPO | Admitting: Licensed Clinical Social Worker

## 2023-03-04 ENCOUNTER — Ambulatory Visit (INDEPENDENT_AMBULATORY_CARE_PROVIDER_SITE_OTHER): Payer: Medicare PPO | Admitting: Family Medicine

## 2023-03-04 ENCOUNTER — Ambulatory Visit: Payer: Medicare PPO | Admitting: Licensed Clinical Social Worker

## 2023-03-04 VITALS — BP 118/78 | HR 80 | Wt 144.2 lb

## 2023-03-04 DIAGNOSIS — G8929 Other chronic pain: Secondary | ICD-10-CM | POA: Diagnosis not present

## 2023-03-04 DIAGNOSIS — D508 Other iron deficiency anemias: Secondary | ICD-10-CM

## 2023-03-04 DIAGNOSIS — R7989 Other specified abnormal findings of blood chemistry: Secondary | ICD-10-CM | POA: Diagnosis not present

## 2023-03-04 DIAGNOSIS — J90 Pleural effusion, not elsewhere classified: Secondary | ICD-10-CM

## 2023-03-04 DIAGNOSIS — Z23 Encounter for immunization: Secondary | ICD-10-CM | POA: Diagnosis not present

## 2023-03-04 DIAGNOSIS — I480 Paroxysmal atrial fibrillation: Secondary | ICD-10-CM | POA: Diagnosis not present

## 2023-03-04 DIAGNOSIS — M545 Low back pain, unspecified: Secondary | ICD-10-CM

## 2023-03-04 DIAGNOSIS — F332 Major depressive disorder, recurrent severe without psychotic features: Secondary | ICD-10-CM | POA: Diagnosis not present

## 2023-03-04 MED ORDER — TIZANIDINE HCL 2 MG PO TABS
2.0000 mg | ORAL_TABLET | Freq: Two times a day (BID) | ORAL | 0 refills | Status: DC | PRN
Start: 2023-03-04 — End: 2023-04-06

## 2023-03-04 NOTE — Progress Notes (Signed)
[facility] Counselor/Therapist Progress Note  Patient ID: Karen Casey, MRN: [id number]    Date: 03/04/23  Time Spent: 1100  am - 1137 am : 37 Minutes  Treatment Type: Individual Therapy.  Reported Symptoms: Symptoms of depression and grief  Mental Status Exam: Appearance:  Casual     Behavior: Appropriate  Motor: Normal  Speech/Language:  Clear and Coherent  Affect: Appropriate  Mood: depressed  Thought process: normal  Thought content:   WNL  Sensory/Perceptual disturbances:   WNL  Orientation: oriented to person, place, time/date, situation, day of week, month of year, and year  Attention: Good  Concentration: Good  Memory: WNL  Fund of knowledge:  Good  Insight:   Good  Judgment:  Good  Impulse Control: Good   Risk Assessment: Danger to Self:  No Self-injurious Behavior: No Danger to Others: No Duty to Warn:no Physical Aggression / Violence:No  Access to Firearms a concern: No  Gang Involvement:No   Subjective:   [name] participated from home, via video, and Kellin consented to treatment. Therapist participated from home office. We met online due to patient preference.  Patient presented for her session. There were connectivity issues and we lost connection twice. After last connection loss Clinician was unable to reconnect or reach patient via phone. Patient engaged in discussion and reported that she feels depressed and lonely. She reports that losing her husband has had a very negative affect on her and she now lives in a shared living home. Patient reports that she gets a visit once a week from her daughter and grandchildren. Clinician discussed with patient her hobbies and things she enjoys doing. Patient stated she like to watch soap operas and read romance books. Clinician encouraged patient to call friends and make plans for lunch and time out in the community. Patient reports that she will on occasion get with her friends for lunch.  Clinician and patient discussed and processed the loss of her husband and how they met. Patient enjoyed reminiscing about her younger years and dating her husband.   Interventions: Cognitive Behavioral Therapy and Grief Therapy  Diagnosis: Severe episode of recurrent major depressive disorder, without psychotic features.   Plan: Bita  is to use CBT, mindfulness and coping skills to help manage decrease symptoms associated with their diagnosis.   Long-term goal:   [place] will reduce overall level, frequency, and intensity of the feelings of depression, anxiety and grief evidenced by decreased irritability, negative self talk, and helpless feelings from 6 to 7 days/week to 0 to 2 days/week per client report for at least 3 consecutive months. Treatment plan to be reviewed by 01/27/2024.  Short-term goal:  Deriona will verbally express understanding of the relationship between feelings of depression, anxiety and their impact on thinking patterns and behaviors. Verbalize an understanding of the role that distorted thinking plays in creating fears, excessive worry, and ruminations.  [name] MSW, LCSW DATE:03/04/2023

## 2023-03-04 NOTE — Progress Notes (Signed)
    SUBJECTIVE:   CHIEF COMPLAINT / HPI:   Right pleural effusion with recent pneumonia- saw pulm yesterday, repeat CXR ordered. No fevers or chills. Feels breathing is better now.   Iron deficiency anemia- not taking ferrous sulfate as it makes her constipated. Would be willing to do iron infusion. No blood in stool.  Left back pain- no fevers, bowel or bladder incontinence, no saddle paresthesia, no numbness or weakness. Does not radiate. Admits to taking tylenol six times per day for the past two days "I know I can't take anything else and its not even helping." Open to trying muscle relaxer. Denies abdominal pain, nausea, vomiting, changes in color of stool or urine. Has only taken Tylenol 1000 mg this morning. No sciatica symptoms.   Peripheral neuropathy- taking oxcarbazepine 150mg  BID and feels this is improved and helping.   Paroxysmal Afib- taking Eliquis and amiodarone. Denies chest pain, palpitations, and shortness of breath. Denies blood in stool or urine.    OBJECTIVE:   BP 118/78   Pulse 80   Wt 144 lb 3.2 oz (65.4 kg)   SpO2 100%   BMI 24.75 kg/m   General: A&O, NAD HEENT: No sign of trauma, EOM grossly intact, no scleral icterus Cardiac: RRR, no m/r/g Respiratory: CTAB, normal WOB, no w/c/r GI: Soft, NTTP, non-distended , No CVA tenderness Extremities: NTTP, no peripheral edema. MSK: no midline lumbar spinal tenderness, no induration, erythema, or palpable step off, left side MSK TTP paraspinal musculature Neuro: antalgic gait, sensation intact in bilateral lower extremities, strength 5/5 in bilateral lower extremity, patellar DTRs 2+ bilaterally  moves all four extremities appropriately. Psych: Appropriate mood and affect   ASSESSMENT/PLAN:   Assessment & Plan Pleural effusion on right Improved, no fevers/chills or cough, reviewed CXR and no effusion noted, official read pending, following with Dr Delton Coombes Elevated LFTs Previously elevated alk phos while in  hospital, will repeat today, Counseling on appropriate use of 3000 mg/day of Tylenol Other iron deficiency anemia Not taking ferrous sulfate due to constipation , no blood in stool or urine while on Eliquis, colonoscopy in 2022 with one polyp and non-bleeding hemorrhoids and recommend f/u in3 years Consider IV iron if ferritin/CBC remains low, no signs of bleeding Chronic bilateral low back pain without sciatica More on left side, without red flag signs and symptoms Counseled on appropriate use of tylenol 3000mg  or less per day Tizanidine 2mg  Bid PRN for muscle spasm, return precautions discussed Encounter for immunization Flu vaccine given today. Recommended COVID booster as well.  Paroxysmal atrial fibrillation (HCC) On amiodarone and Eliquis, heart RRR today on exam, no signs/symptoms of bleeding  Discussed pap smear due- pt declines at this time. Discussed can make f/u appt for pap anytime.   Billey Co, MD [facility]

## 2023-03-04 NOTE — Assessment & Plan Note (Signed)
On amiodarone and Eliquis, heart RRR today on exam, no signs/symptoms of bleeding

## 2023-03-04 NOTE — Assessment & Plan Note (Signed)
Not taking ferrous sulfate due to constipation , no blood in stool or urine while on Eliquis, colonoscopy in 2022 with one polyp and non-bleeding hemorrhoids and recommend f/u in3 years Consider IV iron if ferritin/CBC remains low, no signs of bleeding

## 2023-03-04 NOTE — Assessment & Plan Note (Signed)
Improved, no fevers/chills or cough, reviewed CXR and no effusion noted, official read pending, following with Dr Delton Coombes

## 2023-03-04 NOTE — Assessment & Plan Note (Signed)
Previously elevated alk phos while in hospital, will repeat today, Counseling on appropriate use of 3000 mg/day of Tylenol

## 2023-03-04 NOTE — Patient Instructions (Addendum)
It was wonderful to see you today.  Please bring ALL of your medications with you to every visit.   Today we talked about:  - We ordered a screening mammogram for you today. You can call [facility] at [phone] to schedule this.   -we gave you a flu shot.  We checked lab work- please do NOT take more than 3000 mg a day of Tylenol. I sent a muscle relaxer in for your back. You cna take twice daily as needed   Thank you for choosing [facility].   Please call [phone] with any questions about today's appointment.  Please arrive at least 15 minutes prior to your scheduled appointments.   If you had blood work today, I will send you a MyChart message or a letter if results are normal. Otherwise, I will give you a call.   If you had a referral placed, they will call you to set up an appointment. Please give [place] a call if you don't hear back in the next 2 weeks.   If you need additional refills before your next appointment, please call your pharmacy first.   Burley Saver, MD  Family Medicine

## 2023-03-05 ENCOUNTER — Encounter (HOSPITAL_COMMUNITY): Payer: Self-pay

## 2023-03-05 LAB — COMPREHENSIVE METABOLIC PANEL
ALT: 10 IU/L (ref 0–32)
AST: 18 IU/L (ref 0–40)
Albumin: 3.6 g/dL — ABNORMAL LOW (ref 3.9–4.9)
Alkaline Phosphatase: 213 IU/L — ABNORMAL HIGH (ref 44–121)
BUN/Creatinine Ratio: 9 — ABNORMAL LOW (ref 12–28)
BUN: 6 mg/dL — ABNORMAL LOW (ref 8–27)
Bilirubin Total: 0.2 mg/dL (ref 0.0–1.2)
CO2: 22 mmol/L (ref 20–29)
Calcium: 8.2 mg/dL — ABNORMAL LOW (ref 8.7–10.3)
Chloride: 96 mmol/L (ref 96–106)
Creatinine, Ser: 0.64 mg/dL (ref 0.57–1.00)
Globulin, Total: 2.4 g/dL (ref 1.5–4.5)
Glucose: 84 mg/dL (ref 70–99)
Potassium: 4.6 mmol/L (ref 3.5–5.2)
Sodium: 130 mmol/L — ABNORMAL LOW (ref 134–144)
Total Protein: 6 g/dL (ref 6.0–8.5)
eGFR: 99 mL/min/{1.73_m2} (ref >59)

## 2023-03-05 LAB — IRON,TIBC AND FERRITIN PANEL
Ferritin: 67 ng/mL (ref 15–150)
Iron Saturation: 19 % (ref 15–55)
Iron: 51 ug/dL (ref 27–139)
Total Iron Binding Capacity: 264 ug/dL (ref 250–450)
UIBC: 213 ug/dL (ref 118–369)

## 2023-03-05 LAB — CBC
Hematocrit: 36 % (ref 34.0–46.6)
Hemoglobin: 11.5 g/dL (ref 11.1–15.9)
MCH: 27.9 pg (ref 26.6–33.0)
MCHC: 31.9 g/dL (ref 31.5–35.7)
MCV: 87 fL (ref 79–97)
Platelets: 290 10*3/uL (ref 150–450)
RBC: 4.12 x10E6/uL (ref 3.77–5.28)
RDW: 17.6 % — ABNORMAL HIGH (ref 11.7–15.4)
WBC: 6.5 10*3/uL (ref 3.4–10.8)

## 2023-03-07 ENCOUNTER — Other Ambulatory Visit: Payer: Self-pay | Admitting: Family Medicine

## 2023-03-07 DIAGNOSIS — R7989 Other specified abnormal findings of blood chemistry: Secondary | ICD-10-CM

## 2023-03-08 ENCOUNTER — Other Ambulatory Visit: Payer: Self-pay | Admitting: Family Medicine

## 2023-03-08 ENCOUNTER — Telehealth: Payer: Self-pay

## 2023-03-08 DIAGNOSIS — G8929 Other chronic pain: Secondary | ICD-10-CM

## 2023-03-08 MED ORDER — LIDOCAINE 4 % EX PTCH
1.0000 | MEDICATED_PATCH | CUTANEOUS | 1 refills | Status: DC
Start: 2023-03-08 — End: 2023-06-16

## 2023-03-08 NOTE — Telephone Encounter (Signed)
Patient calls nurse line requesting to speak with Dr. Miquel Dunn.   She states that medication for muscle spasms was prescribed on Friday. Per chart review, Tizanidine was prescribed on Friday.   Feels that medication has caused her back "to go crazy, it hurts so bad." Feels that is is causing cramps and tightness in her back.   Denies difficulty breathing, swallowing, chest pain or rash.   She is asking if alternative medication can be sent to her pharmacy.   Will forward to PCP for further advisement.    Preferred pharmacy: [facility].

## 2023-03-09 ENCOUNTER — Telehealth: Payer: Self-pay | Admitting: Family Medicine

## 2023-03-09 ENCOUNTER — Telehealth: Payer: Self-pay

## 2023-03-09 DIAGNOSIS — G8929 Other chronic pain: Secondary | ICD-10-CM

## 2023-03-09 DIAGNOSIS — E041 Nontoxic single thyroid nodule: Secondary | ICD-10-CM

## 2023-03-09 DIAGNOSIS — R748 Abnormal levels of other serum enzymes: Secondary | ICD-10-CM

## 2023-03-09 LAB — GAMMA GT: GGT: 109 IU/L — ABNORMAL HIGH (ref 0–60)

## 2023-03-09 LAB — SPECIMEN STATUS REPORT

## 2023-03-09 NOTE — Telephone Encounter (Signed)
Called patient to discuss recent lab results. Elevated alk phos and GTT, new since about Feb of this year. Discussed RUQ [place] and she is amenable to getting this, also amenable to getting thyroid US at the same time for previosly noted thyroid nodule.  Discussed ok to stop the tizanidine as it made her back pain worse. Reiterated can take tylenol 3000 mg max daily. Discussed OTC or prescription that I sent in for lidocaine patches for back pain. Offered PT, will order for home health as she has difficulty with transporation and is unable to safely ambulate or travel to appointments by herself.  She requests [place] to be scheduled anytime after 03/21/23- will message staff to schedule and call patient back with Date/time.  Burley Saver MD    [facility] team- can please schedule her RUQ [place] and thyroid US for anytime 03/21/23 and after and then call patient with appt?  RN Team- HHPT order placed, is there anything else needed from me order wise?  Thanks all, Dr Miquel Dunn

## 2023-03-09 NOTE — Telephone Encounter (Signed)
Called and LVM for patient with her appointments for upcoming ultrasounds.  [facility] 03/23/2023 US Thyroid 1000 US Abdomen Limited 0900  Patient is to be NPO after midnight  .Glennie Hawk, CMA

## 2023-03-10 NOTE — Telephone Encounter (Signed)
Karen Casey- patient returned call to nurse line regarding PT referral. She is asking if referral can be sent to [place], as she has worked with them before.   She states that she is not interested in having PT from [facility].   Veronda Prude, RN

## 2023-03-10 NOTE — Telephone Encounter (Signed)
Provider spoke with patient. See separate encounter.   Veronda Prude, RN

## 2023-03-11 NOTE — Telephone Encounter (Signed)
Nino Glow, PT with [phone] called nurse line to advise that she scheduled patient's start of care for next Tuesday.   Veronda Prude, RN

## 2023-03-12 ENCOUNTER — Encounter (HOSPITAL_COMMUNITY): Payer: Self-pay

## 2023-03-12 ENCOUNTER — Other Ambulatory Visit: Payer: Self-pay

## 2023-03-12 ENCOUNTER — Emergency Department (HOSPITAL_COMMUNITY)
Admission: EM | Admit: 2023-03-12 | Discharge: 2023-03-13 | Disposition: A | Discharge status: Home / Self Care | Payer: Medicare PPO | Attending: Emergency Medicine | Admitting: Emergency Medicine

## 2023-03-12 DIAGNOSIS — I10 Essential (primary) hypertension: Secondary | ICD-10-CM | POA: Diagnosis not present

## 2023-03-12 DIAGNOSIS — Z7901 Long term (current) use of anticoagulants: Secondary | ICD-10-CM | POA: Diagnosis not present

## 2023-03-12 DIAGNOSIS — R918 Other nonspecific abnormal finding of lung field: Secondary | ICD-10-CM | POA: Diagnosis not present

## 2023-03-12 DIAGNOSIS — R932 Abnormal findings on diagnostic imaging of liver and biliary tract: Secondary | ICD-10-CM | POA: Diagnosis not present

## 2023-03-12 DIAGNOSIS — R780 Finding of alcohol in blood: Secondary | ICD-10-CM | POA: Insufficient documentation

## 2023-03-12 DIAGNOSIS — F1020 Alcohol dependence, uncomplicated: Secondary | ICD-10-CM | POA: Diagnosis not present

## 2023-03-12 DIAGNOSIS — I4891 Unspecified atrial fibrillation: Secondary | ICD-10-CM | POA: Diagnosis not present

## 2023-03-12 DIAGNOSIS — R112 Nausea with vomiting, unspecified: Secondary | ICD-10-CM | POA: Diagnosis not present

## 2023-03-12 DIAGNOSIS — E871 Hypo-osmolality and hyponatremia: Secondary | ICD-10-CM | POA: Diagnosis not present

## 2023-03-12 DIAGNOSIS — D649 Anemia, unspecified: Secondary | ICD-10-CM | POA: Diagnosis not present

## 2023-03-12 DIAGNOSIS — K219 Gastro-esophageal reflux disease without esophagitis: Secondary | ICD-10-CM | POA: Diagnosis not present

## 2023-03-12 DIAGNOSIS — J449 Chronic obstructive pulmonary disease, unspecified: Secondary | ICD-10-CM | POA: Diagnosis not present

## 2023-03-12 DIAGNOSIS — K5730 Diverticulosis of large intestine without perforation or abscess without bleeding: Secondary | ICD-10-CM | POA: Insufficient documentation

## 2023-03-12 DIAGNOSIS — Y906 Blood alcohol level of 120-199 mg/100 ml: Secondary | ICD-10-CM | POA: Diagnosis not present

## 2023-03-12 DIAGNOSIS — R1013 Epigastric pain: Secondary | ICD-10-CM | POA: Diagnosis present

## 2023-03-12 DIAGNOSIS — R9431 Abnormal electrocardiogram [ECG] [EKG]: Secondary | ICD-10-CM | POA: Diagnosis not present

## 2023-03-12 DIAGNOSIS — E878 Other disorders of electrolyte and fluid balance, not elsewhere classified: Secondary | ICD-10-CM | POA: Diagnosis not present

## 2023-03-12 DIAGNOSIS — J9811 Atelectasis: Secondary | ICD-10-CM | POA: Diagnosis not present

## 2023-03-12 DIAGNOSIS — I119 Hypertensive heart disease without heart failure: Secondary | ICD-10-CM | POA: Insufficient documentation

## 2023-03-12 DIAGNOSIS — K529 Noninfective gastroenteritis and colitis, unspecified: Secondary | ICD-10-CM | POA: Insufficient documentation

## 2023-03-12 DIAGNOSIS — I700 Atherosclerosis of aorta: Secondary | ICD-10-CM | POA: Diagnosis not present

## 2023-03-12 DIAGNOSIS — R109 Unspecified abdominal pain: Secondary | ICD-10-CM | POA: Diagnosis not present

## 2023-03-12 DIAGNOSIS — R1084 Generalized abdominal pain: Secondary | ICD-10-CM | POA: Diagnosis not present

## 2023-03-12 DIAGNOSIS — R457 State of emotional shock and stress, unspecified: Secondary | ICD-10-CM | POA: Diagnosis not present

## 2023-03-12 DIAGNOSIS — J90 Pleural effusion, not elsewhere classified: Secondary | ICD-10-CM | POA: Diagnosis not present

## 2023-03-12 MED ORDER — SODIUM CHLORIDE 0.9 % IV BOLUS
1000.0000 mL | Freq: Once | INTRAVENOUS | Status: AC
  Administered 2023-03-13: 1000 mL via INTRAVENOUS

## 2023-03-12 NOTE — ED Triage Notes (Signed)
BIB EMS/ pt reports ABD pain and N,V,D x 1 day/ hx of same/ pt reports eating lettuce yesterday and getting sick/ pt is tearful and anxious/ pt reports being depressed since December d/t pt's husbands sudden death/ pt denies SI or HI/ ETOH- pt reports she drank 2 mikes hard lemonades, pt has obvious slurred speech/ otherwise pt is A&Ox4

## 2023-03-13 ENCOUNTER — Emergency Department (HOSPITAL_COMMUNITY): Payer: Medicare PPO

## 2023-03-13 ENCOUNTER — Telehealth: Payer: Self-pay | Admitting: Pulmonary Disease

## 2023-03-13 DIAGNOSIS — R109 Unspecified abdominal pain: Secondary | ICD-10-CM | POA: Diagnosis not present

## 2023-03-13 DIAGNOSIS — R112 Nausea with vomiting, unspecified: Secondary | ICD-10-CM | POA: Diagnosis not present

## 2023-03-13 DIAGNOSIS — K5730 Diverticulosis of large intestine without perforation or abscess without bleeding: Secondary | ICD-10-CM | POA: Diagnosis not present

## 2023-03-13 DIAGNOSIS — J9811 Atelectasis: Secondary | ICD-10-CM | POA: Diagnosis not present

## 2023-03-13 DIAGNOSIS — R932 Abnormal findings on diagnostic imaging of liver and biliary tract: Secondary | ICD-10-CM | POA: Diagnosis not present

## 2023-03-13 DIAGNOSIS — J90 Pleural effusion, not elsewhere classified: Secondary | ICD-10-CM | POA: Diagnosis not present

## 2023-03-13 LAB — CBC WITH DIFFERENTIAL/PLATELET
Abs Immature Granulocytes: 0.01 10*3/uL (ref 0.00–0.07)
Basophils Absolute: 0 10*3/uL (ref 0.0–0.1)
Basophils Relative: 0 %
Eosinophils Absolute: 0.1 10*3/uL (ref 0.0–0.5)
Eosinophils Relative: 2 %
HCT: 34.7 % — ABNORMAL LOW (ref 36.0–46.0)
Hemoglobin: 11.1 g/dL — ABNORMAL LOW (ref 12.0–15.0)
Immature Granulocytes: 0 %
Lymphocytes Relative: 30 %
Lymphs Abs: 1.8 10*3/uL (ref 0.7–4.0)
MCH: 27.7 pg (ref 26.0–34.0)
MCHC: 32 g/dL (ref 30.0–36.0)
MCV: 86.5 fL (ref 80.0–100.0)
Monocytes Absolute: 0.6 10*3/uL (ref 0.1–1.0)
Monocytes Relative: 10 %
Neutro Abs: 3.3 10*3/uL (ref 1.7–7.7)
Neutrophils Relative %: 58 %
Platelets: 242 10*3/uL (ref 150–400)
RBC: 4.01 MIL/uL (ref 3.87–5.11)
RDW: 18.8 % — ABNORMAL HIGH (ref 11.5–15.5)
WBC: 5.8 10*3/uL (ref 4.0–10.5)
nRBC: 0 % (ref 0.0–0.2)

## 2023-03-13 LAB — GASTROINTESTINAL PANEL BY PCR, STOOL (REPLACES STOOL CULTURE)

## 2023-03-13 LAB — COMPREHENSIVE METABOLIC PANEL
ALT: 15 U/L (ref 0–44)
AST: 26 U/L (ref 15–41)
Albumin: 3.1 g/dL — ABNORMAL LOW (ref 3.5–5.0)
Alkaline Phosphatase: 161 U/L — ABNORMAL HIGH (ref 38–126)
Anion gap: 11 (ref 5–15)
BUN: <5 mg/dL — ABNORMAL LOW (ref 8–23)
CO2: 24 mmol/L (ref 22–32)
Calcium: 7.8 mg/dL — ABNORMAL LOW (ref 8.9–10.3)
Chloride: 92 mmol/L — ABNORMAL LOW (ref 98–111)
Creatinine, Ser: 0.53 mg/dL (ref 0.44–1.00)
GFR, Estimated: >60 mL/min (ref >60)
Glucose, Bld: 81 mg/dL (ref 70–99)
Potassium: 3.5 mmol/L (ref 3.5–5.1)
Sodium: 127 mmol/L — ABNORMAL LOW (ref 135–145)
Total Bilirubin: 0.5 mg/dL (ref 0.3–1.2)
Total Protein: 6.7 g/dL (ref 6.5–8.1)

## 2023-03-13 LAB — C DIFFICILE QUICK SCREEN W PCR REFLEX
C Diff antigen: NEGATIVE
C Diff interpretation: NOT DETECTED
C Diff toxin: NEGATIVE

## 2023-03-13 LAB — LIPASE, BLOOD: Lipase: 27 U/L (ref 11–51)

## 2023-03-13 LAB — ETHANOL: Alcohol, Ethyl (B): 152 mg/dL — ABNORMAL HIGH (ref <10)

## 2023-03-13 MED ORDER — IOHEXOL 300 MG/ML  SOLN
100.0000 mL | Freq: Once | INTRAMUSCULAR | Status: AC | PRN
  Administered 2023-03-13: 100 mL via INTRAVENOUS

## 2023-03-13 MED ORDER — ONDANSETRON HCL 4 MG/2ML IJ SOLN
4.0000 mg | Freq: Once | INTRAMUSCULAR | Status: AC
  Administered 2023-03-13: 4 mg via INTRAVENOUS
  Filled 2023-03-13: qty 2

## 2023-03-13 MED ORDER — ONDANSETRON HCL 4 MG PO TABS: 4.0000 mg | ORAL_TABLET | Freq: Four times a day (QID) | ORAL | 0 refills | Status: DC

## 2023-03-13 MED ORDER — LOPERAMIDE HCL 2 MG PO CAPS: 2.0000 mg | ORAL_CAPSULE | Freq: Four times a day (QID) | ORAL | 0 refills | Status: DC | PRN

## 2023-03-13 MED ORDER — FAMOTIDINE 20 MG PO TABS: 20.0000 mg | ORAL_TABLET | Freq: Two times a day (BID) | ORAL | 0 refills | Status: DC

## 2023-03-13 NOTE — ED Notes (Signed)
Brief changed/ pt continues to have loose stool/ barrier cream applied to bottom

## 2023-03-13 NOTE — ED Notes (Signed)
Pt bed linen, brief, pad, blankets changed/ barrier cream placed on pts bottom and peri area/ pericare performed

## 2023-03-13 NOTE — ED Provider Notes (Signed)
[place] EMERGENCY DEPARTMENT AT [facility] Provider Note   CSN: [id number] Arrival date & time: 03/12/23  2116     History  Chief Complaint  Patient presents with   Abdominal Pain   Anxiety    Karen Casey is a 67 y.o. female.  HPI   Patient with medical history including GERD, COPD, A-fib currently on Eliquis, hypertension, IBS, chronic alcohol use, presenting with complaints of abdominal pain nausea vomiting diarrhea.  Patient states that started yesterday happened after she ate a sandwich, believes that the lettuce was bad.  Patient states that she has been unable to tolerate p.o., has had multiple episodes of diarrhea, denies bloody stools or dark tarry stools denies any bloody emesis or coffee-ground emesis, recently finished a antibiotic course for pneumonia, no history of C. difficile, she admits to abdominal pain, states is in her epigastric region, does not radiate, states is constant.  Patient states that she has chronic nausea diarrhea and stomach pains, states this feels like her normal, she states that she came in because she has been having more frequent diarrhea.  She admits to drinking 2 beers earlier today, states that she does not drink daily, states that she does feel slightly depressed but denies SI or HI does not appear to respond to internal stimuli.    Home Medications Prior to Admission medications   Medication Sig Start Date End Date Taking? Authorizing Provider  albuterol (VENTOLIN HFA) 108 (90 Base) MCG/ACT inhaler Inhale 2 puffs into the lungs every 6 (six) hours as needed for wheezing or shortness of breath. 10/29/22  Yes Pray, Milus Mallick, MD  amiodarone (PACERONE) 200 MG tablet Take 1 tablet (200 mg total) by mouth daily. 12/06/22  Yes Lewanda Rife, MD  apixaban (ELIQUIS) 5 MG TABS tablet Take 1 tablet (5 mg total) by mouth 2 (two) times daily. 12/06/22  Yes Lewanda Rife, MD  busPIRone (BUSPAR) 5 MG tablet Take 5 mg by mouth 2  (two) times daily. 01/04/23  Yes [provider]  cholecalciferol (VITAMIN D3) 25 MCG (1000 UNIT) tablet Take 1,000 Units by mouth daily.   Yes [provider]  clonazePAM (KLONOPIN) 0.5 MG tablet Take 1 tablet (0.5 mg total) by mouth 2 (two) times daily as needed for anxiety. Ok to fill 12/29/22 12/28/22  Yes Pray, Milus Mallick, MD  escitalopram (LEXAPRO) 20 MG tablet Take 1 tablet (20 mg total) by mouth daily. 02/11/23  Yes Pray, Milus Mallick, MD  famotidine (PEPCID) 20 MG tablet Take 1 tablet (20 mg total) by mouth 2 (two) times daily. 03/13/23  Yes Carroll Sage, PA-C  ferrous sulfate 325 (65 FE) MG tablet Take 1 tablet (325 mg total) by mouth every other day. 12/06/22  Yes Lewanda Rife, MD  Fluticasone-Umeclidin-Vilant (TRELEGY ELLIPTA) 100-62.5-25 MCG/ACT AEPB Inhale 1 puff into the lungs daily. 10/29/22  Yes Pray, Milus Mallick, MD  gabapentin (NEURONTIN) 400 MG capsule TAKE 2 CAPSULES BY MOUTH THREE TIMES DAILY 02/07/23  Yes Pray, Milus Mallick, MD  lidocaine 4 % Place 1 patch onto the skin daily. Patient taking differently: Place 1 patch onto the skin daily as needed. 03/08/23  Yes Pray, Milus Mallick, MD  loperamide (IMODIUM) 2 MG capsule Take 1 capsule (2 mg total) by mouth 4 (four) times daily as needed for diarrhea or loose stools. 03/13/23  Yes Carroll Sage, PA-C  Multiple Vitamins-Minerals (CENTRUM ADULT PO) Take 1 tablet by mouth daily.   Yes [provider]  ondansetron (ZOFRAN) 4  MG tablet Take 1 tablet (4 mg total) by mouth every 6 (six) hours. 03/13/23  Yes Carroll Sage, PA-C  OXcarbazepine (TRILEPTAL) 150 MG tablet Take 150 mg by mouth 2 (two) times daily. 02/27/23  Yes [provider]  pantoprazole (PROTONIX) 40 MG tablet Take 1 tablet (40 mg total) by mouth daily. 12/06/22  Yes Lewanda Rife, MD  traZODone (DESYREL) 50 MG tablet Take 1 tablet (50 mg total) by mouth at bedtime as needed for sleep. Patient taking differently: Take 50 mg by  mouth at bedtime. 12/06/22  Yes Lewanda Rife, MD  denosumab (PROLIA) 60 MG/ML SOSY injection Inject 60 mg into the skin every 6 (six) months. 02/11/23   Billey Co, MD  guaiFENesin (ROBITUSSIN) 100 MG/5ML liquid Take 10 mLs by mouth every 6 (six) hours as needed for cough or to loosen phlegm. Patient not taking: Reported on 03/12/2023 02/03/23   Dorcas Carrow, MD  nicotine (NICODERM CQ - DOSED IN MG/24 HOURS) 21 mg/24hr patch Place 1 patch (21 mg total) onto the skin daily. Patient not taking: Reported on 03/12/2023 02/03/23   Dorcas Carrow, MD  tiZANidine (ZANAFLEX) 2 MG tablet Take 1 tablet (2 mg total) by mouth 2 (two) times daily as needed for muscle spasms (back pain). Patient not taking: Reported on 03/12/2023 03/04/23   Billey Co, MD  hydrOXYzine (ATARAX) 10 MG tablet TAKE 1 TABLET BY MOUTH THREE TIMES A DAY AS NEEDED FOR ANXIETY 10/15/22   Billey Co, MD      Allergies    Capsaicin-menthol and Diclo gel [diclofenac sodium]    Review of Systems   Review of Systems  Constitutional:  Negative for chills and fever.  Respiratory:  Negative for shortness of breath.   Cardiovascular:  Negative for chest pain.  Gastrointestinal:  Positive for abdominal pain, diarrhea, nausea and vomiting.  Neurological:  Negative for headaches.    Physical Exam Updated Vital Signs BP 112/88 (BP Location: Left Arm)   Pulse 75   Temp 98 F (36.7 C) (Oral)   Resp 18   Wt 62.1 kg   SpO2 97%   BMI 23.50 kg/m  Physical Exam Vitals and nursing note reviewed.  Constitutional:      General: She is not in acute distress.    Appearance: She is not ill-appearing.  HENT:     Head: Normocephalic and atraumatic.     Nose: No congestion.  Eyes:     Conjunctiva/sclera: Conjunctivae normal.  Cardiovascular:     Rate and Rhythm: Normal rate and regular rhythm.     Pulses: Normal pulses.     Heart sounds: No murmur heard.    No friction rub. No gallop.  Pulmonary:     Effort: No  respiratory distress.     Breath sounds: No wheezing, rhonchi or rales.  Abdominal:     Palpations: Abdomen is soft.     Tenderness: There is abdominal tenderness. There is no right CVA tenderness or left CVA tenderness.     Comments: Abdomen nondistended, soft, minimal tenderness mainly in the umbilicus/epigastric region, there is no guarding rebound tenderness or peritoneal sign.  Skin:    General: Skin is warm and dry.  Neurological:     Mental Status: She is alert.     Comments: Responding appropriately, does appear to be slightly intoxicated, denies SI or HI, does not appear to be respond to internal stimuli.  Psychiatric:        Mood and Affect: Mood normal.  ED Results / Procedures / Treatments   Labs (all labs ordered are listed, but only abnormal results are displayed) Labs Reviewed  COMPREHENSIVE METABOLIC PANEL - Abnormal; Notable for the following components:      Result Value   Sodium 127 (*)    Chloride 92 (*)    BUN <5 (*)    Calcium 7.8 (*)    Albumin 3.1 (*)    Alkaline Phosphatase 161 (*)    All other components within normal limits  CBC WITH DIFFERENTIAL/PLATELET - Abnormal; Notable for the following components:   Hemoglobin 11.1 (*)    HCT 34.7 (*)    RDW 18.8 (*)    All other components within normal limits  ETHANOL - Abnormal; Notable for the following components:   Alcohol, Ethyl (B) 152 (*)    All other components within normal limits  C DIFFICILE QUICK SCREEN W PCR REFLEX    GASTROINTESTINAL PANEL BY PCR, STOOL (REPLACES STOOL CULTURE)  LIPASE, BLOOD    EKG EKG Interpretation Date/Time:  Sunday March 13 2023 00:26:35 EDT Ventricular Rate:  73 PR Interval:  183 QRS Duration:  89 QT Interval:  482 QTC Calculation: 532 R Axis:   49  Text Interpretation: Sinus rhythm Left atrial enlargement Prolonged QT interval Confirmed by Drema Pry [phone]) on 03/13/2023 4:23:07 AM  Radiology DG Chest 2 View  Result Date: 03/13/2023 CLINICAL  DATA:  Loculation at right lung base. Abdominal pain, nausea, and vomiting. EXAM: CHEST - 2 VIEW COMPARISON:  03/03/2023. FINDINGS: The heart size and mediastinal contours are within normal limits. There is atherosclerotic calcification of the aorta. There is a small right pleural effusion with atelectasis. The left lung is clear. No pneumothorax. Degenerative changes are present in the thoracic spine. A stable compression deformity is noted in the mid to upper thoracic spine. IMPRESSION: Small right pleural effusion with atelectasis. Electronically Signed   By: Thornell Sartorius M.D.   On: 03/13/2023 03:47   CT ABDOMEN PELVIS W CONTRAST  Result Date: 03/13/2023 CLINICAL DATA:  Acute nonlocalized abdominal pain. Nausea, vomiting, and diarrhea for 1 day. EXAM: CT ABDOMEN AND PELVIS WITH CONTRAST TECHNIQUE: Multidetector CT imaging of the abdomen and pelvis was performed using the standard protocol following bolus administration of intravenous contrast. RADIATION DOSE REDUCTION: This exam was performed according to the departmental dose-optimization program which includes automated exposure control, adjustment of the mA and/or kV according to patient size and/or use of iterative reconstruction technique. CONTRAST:  OMNIPAQUE IOHEXOL 300 MG/ML  SOLN COMPARISON:  02/08/2022 FINDINGS: Lower chest: Loculated appearing thick-walled right pleural effusion, possibly abscess or empyema. Atelectasis in the right lung base. Hepatobiliary: Scattered subcentimeter low-attenuation lesions in the liver without change since prior study, likely small cysts or hemangiomas. Gallbladder and bile ducts are unremarkable. Pancreas: Unremarkable. No pancreatic ductal dilatation or surrounding inflammatory changes. Spleen: Normal in size without focal abnormality. Adrenals/Urinary Tract: Adrenal glands are unremarkable. Kidneys are normal, without renal calculi, focal lesion, or hydronephrosis. Bladder is unremarkable. Stomach/Bowel:  Stomach, small bowel, and colon are not abnormally distended. No wall thickening or inflammatory changes. Colonic diverticula without evidence of acute diverticulitis. Appendix appears to be surgically absent. Vascular/Lymphatic: Aortic atherosclerosis. No enlarged abdominal or pelvic lymph nodes. Reproductive: Uterus and bilateral adnexa are unremarkable. Other: No abdominal wall hernia or abnormality. No abdominopelvic ascites. Musculoskeletal: No acute or significant osseous findings. IMPRESSION: 1. Loculated pleural fluid in the right lung base, possibly empyema or abscess. Atelectasis in the right lower lung. 2. No bowel  obstruction or inflammation. Colonic diverticula without evidence of acute diverticulitis. 3. Aortic atherosclerosis. Electronically Signed   By: Burman Nieves M.D.   On: 03/13/2023 02:28    Procedures Procedures    Medications Ordered in ED Medications  sodium chloride 0.9 % bolus 1,000 mL (0 mLs Intravenous Stopped 03/13/23 0302)  ondansetron (ZOFRAN) injection 4 mg (4 mg Intravenous Given 03/13/23 0054)  iohexol (OMNIPAQUE) 300 MG/ML solution 100 mL (100 mLs Intravenous Contrast Given 03/13/23 0200)    ED Course/ Medical Decision Making/ A&P                                 Medical Decision Making Amount and/or Complexity of Data Reviewed Labs: ordered. Radiology: ordered.  Risk Prescription drug management.   This patient presents to the ED for concern of abdominal pain, this involves an extensive number of treatment options, and is a complaint that carries with it a high risk of complications and morbidity.  The differential diagnosis includes C. difficile, bowel obstruction, diverticulitis, bowel obstruction, perforated stomach ulcer,     Additional history obtained:  Additional history obtained from N/A External records from outside source obtained and reviewed including GI notes   Co morbidities that complicate the patient evaluation  IBS  Social  Determinants of Health:  Alcohol dependency    Lab Tests:  I Ordered, and personally interpreted labs.  The pertinent results include: CBC shows normocytic anemia hemoglobin 11.1, CMP reveals sodium 127 chloride 92 calcium 7.8 albumin 3.1 alk phos 1.1, ethanol 152, lipase 27, C. difficile is negative,   Imaging Studies ordered:  I ordered imaging studies including CT abdomen pelvis with contrast I independently visualized and interpreted imaging which showed CT on pelvis reveals loculated pleural effusion in the right lung base I agree with the radiologist interpretation   Cardiac Monitoring:  The patient was maintained on a cardiac monitor.  I personally viewed and interpreted the cardiac monitored which showed an underlying rhythm of: Without signs of ischemia   Medicines ordered and prescription drug management:  I ordered medication including fluids, antiemetics I have reviewed the patients home medicines and have made adjustments as needed  Critical Interventions:  N/A   Reevaluation:  Presents with abdominal pain, nausea vomiting diarrhea, suspect this is acute on chronic, will obtain screening lab workup, and continue to monitor.  Lab work is unremarkable, still states that she has abdominal pain, will obtain CT imaging for further assessment.  CT scan reveals loculation in the right lung base, patient had a history of pneumonia, on the very side, had recent chest x-ray which shows improvement, will repeat additional x-ray for further assessment.  Shows right pleural effusion, will consult pulmonology for further recommendations  Consultations Obtained:  I requested consultation with the Dr.Mathew Hunsucker,  and discussed lab and imaging findings as well as pertinent plan - they recommend: Feels patient is stable for outpatient follow-up he will send a secure message to outpatient pulmonologist to schedule for likely thoracentesis.  Does not feel antibiotics is  necessary at this time    Test Considered:  N/a    Rule out Suspicion for C. difficile very low at this time as her C. difficile is negative.  I doubt pancreatitis his lipase within normal limits.  I doubt cholecystitis cholangitis or choledocholithiasis no elevation liver signs alk phos or T. bili, no evidence of infection or inflammation seen on CT imaging.  I doubt bowel  obstruction, volvulus, ruptured stomach ulcer, intra-abdominal mass or infection CT imaging is all negative these findings.  Suspicion for psychiatric emergency low at this time as she denies any SI or SI.  Suspicion for worsening pneumonia, pyelo-, abscess is very low at this time as patient has no pulmonary symptoms, she is afebrile nontachycardic, will defer on antibiotics.    Dispostion and problem list  After consideration of the diagnostic results and the patients response to treatment, I feel that the patent would benefit from discharge.  Gastroenteritis-will provide her with Pepcid, Imodium, have her follow-up with her GI doctor for further assessment. Right lung mass-patient was made aware of these findings, she will follow-up with her pulmonologist for further assessment.            Final Clinical Impression(s) / ED Diagnoses Final diagnoses:  Gastroenteritis  Lung mass    Rx / DC Orders ED Discharge Orders          Ordered    ondansetron (ZOFRAN) 4 MG tablet  Every 6 hours        03/13/23 0635    loperamide (IMODIUM) 2 MG capsule  4 times daily PRN        03/13/23 0635    famotidine (PEPCID) 20 MG tablet  2 times daily        03/13/23 0635              Carroll Sage, PA-C 03/13/23 [id number]    Nira Conn, MD 03/14/23 [phone]

## 2023-03-13 NOTE — Telephone Encounter (Signed)
Received call from ED physician.  Presented with abdominal complaints.  No chest pain cough or respiratory complaints etc.  CT abdomen pelvis obtained.  Does show a small right sided pleural effusion with some pleural enhancement with contrast.  When compared to CT scan about 6 weeks ago size of pleural effusion is smaller when measured in similar planes by my measurements.  Suspect this is a persistent parapneumonic effusion.  Decreasing over time.  Possibly too small to intervene on.  Given lack of respiratory symptoms I recommend no antibiotics.  Will route to pulmonologist and clinic staff to see about follow-up in clinic for any additional steps felt needed.  Dr. Delton Coombes, please let the front desk know if you feel like any additional steps are needed or to arrange follow-up as deemed clinically necessary. Thanks!

## 2023-03-13 NOTE — ED Notes (Signed)
IV removed per pt request

## 2023-03-13 NOTE — Discharge Instructions (Addendum)
Gastroenteritis-likely causing her diarrhea, given new Imodium take as prescribed, giving Zofran for nausea, Pepcid for stomach acid, recommended bland diet, please follow-up with your gastroenterologist for further evaluation. Right lung mass-please follow-up with your pulmonologist for further assessment.  Come back to the emergency department if you develop chest pain, shortness of breath, severe abdominal pain, uncontrolled nausea, vomiting, diarrhea.

## 2023-03-14 ENCOUNTER — Telehealth: Payer: Self-pay | Admitting: Licensed Clinical Social Worker

## 2023-03-14 ENCOUNTER — Telehealth: Payer: Self-pay

## 2023-03-14 ENCOUNTER — Telehealth: Payer: Self-pay | Admitting: Family Medicine

## 2023-03-14 ENCOUNTER — Telehealth (INDEPENDENT_AMBULATORY_CARE_PROVIDER_SITE_OTHER): Payer: Medicare PPO | Admitting: Licensed Clinical Social Worker

## 2023-03-14 DIAGNOSIS — F332 Major depressive disorder, recurrent severe without psychotic features: Secondary | ICD-10-CM

## 2023-03-14 NOTE — Telephone Encounter (Signed)
NA

## 2023-03-14 NOTE — Telephone Encounter (Signed)
Patient calls nurse line requesting to speak with Dr. Miquel Dunn as soon as possible.   She states that she is "done with life, done with doctors, and that she does not want to live anymore." She states that she would never harm herself but is just hoping that her illness will "take her away."  She lives with her roommate. However, states that roommate does not care about her. She denies SI, HI or plan of harming self.   She feels that grief has taken a hold on her due to the passing of her husband. States that therapy does not help and is a waste of time.   Provided patient with information for [facility] and ED precautions.   Veronda Prude, RN

## 2023-03-14 NOTE — Telephone Encounter (Signed)
Patient was intoxicated and has been all weekend according to patient report. Patient reports being lonely and just needing someone to talk to. Clinician encouraged patient to begin IOP in  order to assist with her drinking and patient refuses information. Patient refused to go the ED or Behavioral Health. Patient was cursing about her previous treatment at both facilities. When Clinician ask patient if she could call her back as she had a patient waiting, Keiran became upset and hung up the phone.

## 2023-03-14 NOTE — Telephone Encounter (Signed)
Patient was intoxicated and has been all weekend according to patient report. Patient reports being lonely and just needing someone to talk to. Clinician encouraged patient to begin IOP in order to assist with her drinking and patient refuses information. Patient refused to go the ED or Behavioral Health. Patient was cursing about her previous treatment at both facilities. When Clinician ask patient if she could call her back as she had a patient waiting, Corinne became upset and hung up the phone.   Previously Clinician left a VM for a return call due to Patient leaving a message cursing and intoxicated at the office.

## 2023-03-14 NOTE — Telephone Encounter (Signed)
Patient reported that she was at her home and safe and denied SI and HI.

## 2023-03-14 NOTE — Telephone Encounter (Signed)
Called the patient. She is obviously intoxicated and crying. Discussed sadness with her spouse passing away, her relations with family, and poor relations with her roommate. She denies SI/HI. She does note she doesn't want to be here anymore, and that she doesn't want to stop drinking. She originally denies plan or intent, but then notes "this may be the last time that I talk to you, I might take my klonopin or drink myself to death." She notes she is at her home address, denies having weapons in the home. She notes "you can call 911 - I ain't going." She is unable to safety plan. She will not go to ED or [facility]. She notes she is grateful that I called back. I discussed that if as she does not seem safe I will need to call 911 for a welfare check. She states "Do what you need, I am not going."  Called 911 at 1904, reported pt home address and statements and requesting welfare check as I am concerned for patients wellness. Gave all relevant information including address/description and concerns.  Burley Saver MD

## 2023-03-14 NOTE — Telephone Encounter (Signed)
See separate phone note.

## 2023-03-14 NOTE — Addendum Note (Signed)
Addended by: Laurence Slate on: 03/14/2023 12:46 PM   Modules accepted: Level of Service

## 2023-03-16 NOTE — Telephone Encounter (Signed)
Please set the patient up for a follow-up with either RB or APP in the next month thank you

## 2023-03-16 NOTE — Telephone Encounter (Signed)
Dr. Delton Coombes, please let the front desk know if you feel like any additional steps are needed or to arrange follow-up as deemed clinically necessary. Thanks!

## 2023-03-18 ENCOUNTER — Ambulatory Visit: Payer: Medicare PPO | Admitting: Licensed Clinical Social Worker

## 2023-03-21 ENCOUNTER — Other Ambulatory Visit: Payer: Self-pay

## 2023-03-21 ENCOUNTER — Ambulatory Visit: Payer: Medicare PPO | Admitting: Licensed Clinical Social Worker

## 2023-03-21 MED ORDER — FAMOTIDINE 20 MG PO TABS: 20.0000 mg | ORAL_TABLET | Freq: Two times a day (BID) | ORAL | 0 refills | Status: DC

## 2023-03-21 MED ORDER — GABAPENTIN 400 MG PO CAPS: 800.0000 mg | ORAL_CAPSULE | Freq: Three times a day (TID) | ORAL | 0 refills | Status: DC

## 2023-03-21 NOTE — Telephone Encounter (Signed)
Patient calls nurse line requesting refills on medications prescribed from the ED.   See pended refills.   She reports diarrhea and stomach cramping has returned.   Been out of medications for three days. She states that she does not want to take the Loperamide every day, she just wants it just in case. Denies blood in stool or fever.   She also reports that she "took herself off" of medications psychiatrist prescribed, as they were making her feel crazy. She states that she informed her psychiatrist of this in a "roundabout way."  Will forward request to PCP.   Veronda Prude, RN

## 2023-03-21 NOTE — Progress Notes (Unsigned)
[facility] Counselor/Therapist Progress Note  Patient ID: Karen Casey, MRN: [id number]    Date: 03/21/23  Time Spent: ***  {LBBHAMPM:26719} - *** {LBBHAMPM:26719} : *** Minutes  Treatment Type: Individual Therapy.  Reported Symptoms: ***  Mental Status Exam: Appearance:  {PSY:22683}     Behavior: {PSY:21022743}  Motor: {PSY:22302}  Speech/Language:  {PSY:22685}  Affect: {PSY:22687}  Mood: {PSY:31886}  Thought process: {PSY:31888}  Thought content:   {PSY:[phone]}  Sensory/Perceptual disturbances:   {PSY:6125484889}  Orientation: {PSY:30297}  Attention: {PSY:22877}  Concentration: {[phone]}  Memory: {PSY:787-628-4633}  Fund of knowledge:  {[phone]}  Insight:   {[phone]}  Judgment:  {[phone]}  Impulse Control: {[phone]}   Risk Assessment: Danger to Self:  {PSY:22692} Self-injurious Behavior: {PSY:22692} Danger to Others: {PSY:22692} Duty to Warn:{PSY:311194} Physical Aggression / Violence:{PSY:21197} Access to Firearms a concern: {PSY:21197} Gang Involvement:{PSY:21197}  Subjective:   Karen Casey participated from {Patient Location:26[address]::"home"}, via {LBBHVIDEOORPHONE:26720}, and consented to treatment. Therapist participated from {LBBHPROVIDERLOCATION:26721}. We met online due to COVID pandemic.   ***   Interventions: {[phone]}  Diagnosis: No diagnosis found.   Plan: ***Patient is to use CBT, mindfulness and coping skills to help manage decrease symptoms associated with their diagnosis.   Long-term goal:   ***Reduce overall level, frequency, and intensity of the feelings of depression, anxiety and panic evidenced by       decreased irritability, negative self talk, and helpless feelings from 6 to 7 days/week to 0 to 1 days/week per client report for at least 3 consecutive months.  Short-term goal:  ***Verbally express understanding of the relationship between feelings of depression, anxiety and  their impact on thinking patterns and behaviors. Verbalize an understanding of the role that distorted thinking plays in creating fears, excessive worry, and ruminations.  [name] MSW, LCSW/DATE

## 2023-03-22 NOTE — Telephone Encounter (Signed)
Patient returns call to nurse line regarding refills. Advised that refills were sent for gabapentin and famotidine.   She is asking "why can't she refill my stomach medications?"   Patient became upset. Advised that I would forward message to provider regarding concern.   Veronda Prude, RN

## 2023-03-22 NOTE — Telephone Encounter (Signed)
Called patient.  She had multiple complaints.  She reports she is eating very little.  She reports she is having profuse watery diarrhea.  She is drinking alcohol again.  She reports she does not want to live.  She reports she has no thoughts of hurting herself or others.  She has no plan.  Her grandchildren keep her alive.  I discussed that would not be safe nor good medicine for me to prescribe loperamide or Zofran as her QT was 532 in the emergency department and both of these medications can prolong the QT further.  I discussed the risk with these medications at length.  She was agreeable to an appointment on Thursday with Dr. Marsh Dolly- --additional time given given complexity of patient.

## 2023-03-23 ENCOUNTER — Ambulatory Visit (HOSPITAL_COMMUNITY): Payer: Medicare PPO

## 2023-03-24 ENCOUNTER — Ambulatory Visit: Payer: Medicare PPO | Admitting: Licensed Clinical Social Worker

## 2023-03-24 ENCOUNTER — Ambulatory Visit: Payer: Self-pay | Admitting: Family Medicine

## 2023-03-25 ENCOUNTER — Other Ambulatory Visit: Payer: Self-pay

## 2023-03-25 DIAGNOSIS — K219 Gastro-esophageal reflux disease without esophagitis: Secondary | ICD-10-CM

## 2023-03-25 MED ORDER — PANTOPRAZOLE SODIUM 40 MG PO TBEC
40.0000 mg | DELAYED_RELEASE_TABLET | Freq: Every day | ORAL | 0 refills | Status: DC
Start: 2023-03-25 — End: 2023-05-24

## 2023-03-29 ENCOUNTER — Telehealth: Payer: Self-pay

## 2023-03-29 NOTE — Telephone Encounter (Signed)
Patient calls nurse line requesting to speak with Dr. Miquel Dunn regarding concerns for cellulitis in left leg.   Reports symptoms starting several days ago.   Redness- using ointment, redness has gone down near sores, swelling in leg has gone down slightly.   Denies fever, chills or pain.   Concerned that she may have gotten stool in one of her leg wounds due to continued issues with watery diarrhea.   She reports that she is not eating due to being "tired of sh**ing on myself."  She states that she needs diarrhea medication and prescription for antibiotics.   She prefers to only see Dr. Miquel Dunn. Advised that Dr. Miquel Dunn does not have any availability until November. She was agreeable to scheduling in ATC with Dr. Laroy Apple tomorrow morning.   She asked that I send message to Dr. Miquel Dunn asking that she call her back. Advised that Dr. Miquel Dunn was in clinic this afternoon, however, I would forward her the message.   Veronda Prude, RN

## 2023-03-29 NOTE — Telephone Encounter (Signed)
Patient states went to ED 03/12/2023. States has fluid on the lungs. No available appointments with Dr. Delton Coombes or NP's. Patient phone number is [phone].

## 2023-03-30 ENCOUNTER — Ambulatory Visit: Payer: Medicare PPO

## 2023-03-30 NOTE — Telephone Encounter (Signed)
Spoke with the pt  She states does not feel that she should wait until 04/19/23 for appt to evaluate her effusion  She went to the ED on 03/12/23 and they told her that it was urgent she be seen  RB- could we use one of your blocked spots for end of day one day?

## 2023-03-30 NOTE — Telephone Encounter (Signed)
OK to try to get her in for an acute visit with any provider if she would like. I don't think she needs to be in a blocked slot. I have reviewed the films, do not believe the effusion is large enough to be tapped as of her hosp visit. She will need a repeat CXR when she is seen

## 2023-03-30 NOTE — Progress Notes (Deleted)
    SUBJECTIVE:   CHIEF COMPLAINT / HPI:    "Patient calls nurse line requesting to speak with Dr. Miquel Dunn regarding concerns for cellulitis in left leg.  Reports symptoms starting several days ago.  Redness- using ointment, redness has gone down near sores, swelling in leg has gone down slightly.  Denies fever, chills or pain.  Concerned that she may have gotten stool in one of her leg wounds due to continued issues with watery diarrhea.  She reports that she is not eating due to being "tired of sh**ing on myself." She states that she needs diarrhea medication and prescription for antibiotics."      PERTINENT  PMH / PSH: ***  OBJECTIVE:   There were no vitals taken for this visit.  ***  ASSESSMENT/PLAN:   No problem-specific Assessment & Plan notes found for this encounter.     Levin Erp, MD [facility]

## 2023-03-31 ENCOUNTER — Encounter: Payer: Self-pay | Admitting: *Deleted

## 2023-03-31 ENCOUNTER — Ambulatory Visit: Payer: Medicare PPO | Admitting: Licensed Clinical Social Worker

## 2023-03-31 DIAGNOSIS — F332 Major depressive disorder, recurrent severe without psychotic features: Secondary | ICD-10-CM | POA: Diagnosis not present

## 2023-03-31 NOTE — Telephone Encounter (Signed)
I have sent her a mychart msg with response from [employer]

## 2023-03-31 NOTE — Progress Notes (Signed)
[facility] Counselor/Therapist Progress Note  Patient ID: Karen Casey, MRN: [id number]    Date: 03/31/23  Time Spent: 0809  am - 0900am : 51 Minutes  Treatment Type: Individual Therapy.  Reported Symptoms: Symptoms of depression and loneliness  Mental Status Exam: Appearance:  Casual     Behavior: Appropriate  Motor: Normal  Speech/Language:  Clear and Coherent  Affect: Appropriate  Mood: normal  Thought process: normal  Thought content:   WNL  Sensory/Perceptual disturbances:   WNL  Orientation: oriented to person, place, time/date, situation, day of week, month of year, and year  Attention: Good  Concentration: Good  Memory: WNL  Fund of knowledge:  Good  Insight:   Good  Judgment:  Good  Impulse Control: Good   Risk Assessment: Danger to Self:  No Self-injurious Behavior: No Danger to Others: No Duty to Warn:no Physical Aggression / Violence:No  Access to Firearms a concern: No  Gang Involvement:No   Subjective:   Karen Casey participated from home, via video, and consented to treatment. Therapist participated from office. We met online due to patient preference.   Patient presented for her session behind schedule due to being sick. Patient was engaged and reported having a disagreement with her daughter yesterday. Clinician encouraged patient to wait until she is calm to attempt contact again. It was also discussed that at that time boundaries need to be set and maintained. Patient and Clinician discussed possible referral to an ACTT team. Patient wasn't interested and stated that she is no longer drinking. Patient shared that she is lonely and has found herself in a situation she never thought she would be in. Clinician encouraged patient to be positive and to work toward goals to improve her situation. Patient reported a place on her leg that was red and swollen. Clinician urged patient to go to Urgent Care or contact her  PCP.  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   Long-term goal:   Karen Casey will reduce overall level, frequency, and intensity of the feelings of depression and loneliness evidenced by  decreased irritability, negative self talk, and helpless feelings from 6 to 7 days/week to 0 to 2 days/week per client report for at least 3 consecutive months. Treatment plan to be reviewed by 01/27/2024.  Short-term goal:  Karen Casey will verbally express understanding of the relationship between feelings of depression and loneliness and their impact on thinking patterns and behaviors. Verbalize an understanding of the role that distorted thinking plays in creating fears, excessive worry, and ruminations.  Karen Casey MSW, LCSW DATE:03/31/2023

## 2023-03-31 NOTE — Telephone Encounter (Signed)
Tried calling the pt and there was no answer- LMTCB.

## 2023-04-04 ENCOUNTER — Telehealth: Payer: Self-pay | Admitting: Family Medicine

## 2023-04-04 ENCOUNTER — Ambulatory Visit: Payer: Medicare PPO

## 2023-04-04 NOTE — Telephone Encounter (Signed)
Patient called to cancel her appointment scheduled for today 04/04/23. She stated she did not want to see any other provider besides Dr. Miquel Dunn. She would like for Dr. Miquel Dunn to call her today if possible.

## 2023-04-05 ENCOUNTER — Inpatient Hospital Stay (HOSPITAL_COMMUNITY)
Admission: EM | Admit: 2023-04-05 | Discharge: 2023-04-09 | DRG: 603 | Disposition: A | Discharge status: Home / Self Care | Payer: Medicare PPO | Attending: Internal Medicine | Admitting: Internal Medicine

## 2023-04-05 ENCOUNTER — Emergency Department (HOSPITAL_COMMUNITY): Payer: Medicare PPO

## 2023-04-05 ENCOUNTER — Other Ambulatory Visit: Payer: Self-pay

## 2023-04-05 DIAGNOSIS — Z8249 Family history of ischemic heart disease and other diseases of the circulatory system: Secondary | ICD-10-CM | POA: Diagnosis not present

## 2023-04-05 DIAGNOSIS — I700 Atherosclerosis of aorta: Secondary | ICD-10-CM | POA: Diagnosis not present

## 2023-04-05 DIAGNOSIS — F418 Other specified anxiety disorders: Secondary | ICD-10-CM | POA: Diagnosis present

## 2023-04-05 DIAGNOSIS — L03116 Cellulitis of left lower limb: Secondary | ICD-10-CM | POA: Diagnosis present

## 2023-04-05 DIAGNOSIS — Z83438 Family history of other disorder of lipoprotein metabolism and other lipidemia: Secondary | ICD-10-CM | POA: Diagnosis not present

## 2023-04-05 DIAGNOSIS — M545 Low back pain, unspecified: Secondary | ICD-10-CM | POA: Diagnosis present

## 2023-04-05 DIAGNOSIS — J449 Chronic obstructive pulmonary disease, unspecified: Secondary | ICD-10-CM | POA: Diagnosis present

## 2023-04-05 DIAGNOSIS — G8929 Other chronic pain: Secondary | ICD-10-CM | POA: Diagnosis present

## 2023-04-05 DIAGNOSIS — L0390 Cellulitis, unspecified: Secondary | ICD-10-CM | POA: Diagnosis not present

## 2023-04-05 DIAGNOSIS — M79605 Pain in left leg: Secondary | ICD-10-CM | POA: Diagnosis not present

## 2023-04-05 DIAGNOSIS — F17210 Nicotine dependence, cigarettes, uncomplicated: Secondary | ICD-10-CM | POA: Diagnosis present

## 2023-04-05 DIAGNOSIS — E876 Hypokalemia: Principal | ICD-10-CM | POA: Diagnosis present

## 2023-04-05 DIAGNOSIS — I959 Hypotension, unspecified: Secondary | ICD-10-CM | POA: Diagnosis not present

## 2023-04-05 DIAGNOSIS — K529 Noninfective gastroenteritis and colitis, unspecified: Secondary | ICD-10-CM | POA: Diagnosis present

## 2023-04-05 DIAGNOSIS — D649 Anemia, unspecified: Secondary | ICD-10-CM | POA: Diagnosis present

## 2023-04-05 DIAGNOSIS — Z888 Allergy status to other drugs, medicaments and biological substances status: Secondary | ICD-10-CM | POA: Diagnosis not present

## 2023-04-05 DIAGNOSIS — M7989 Other specified soft tissue disorders: Secondary | ICD-10-CM | POA: Diagnosis not present

## 2023-04-05 DIAGNOSIS — I10 Essential (primary) hypertension: Secondary | ICD-10-CM | POA: Diagnosis present

## 2023-04-05 DIAGNOSIS — M79662 Pain in left lower leg: Secondary | ICD-10-CM | POA: Diagnosis not present

## 2023-04-05 DIAGNOSIS — Z820 Family history of epilepsy and other diseases of the nervous system: Secondary | ICD-10-CM

## 2023-04-05 DIAGNOSIS — K219 Gastro-esophageal reflux disease without esophagitis: Secondary | ICD-10-CM | POA: Diagnosis present

## 2023-04-05 DIAGNOSIS — L03115 Cellulitis of right lower limb: Secondary | ICD-10-CM | POA: Diagnosis present

## 2023-04-05 DIAGNOSIS — G629 Polyneuropathy, unspecified: Secondary | ICD-10-CM | POA: Diagnosis present

## 2023-04-05 DIAGNOSIS — R9431 Abnormal electrocardiogram [ECG] [EKG]: Secondary | ICD-10-CM | POA: Diagnosis not present

## 2023-04-05 DIAGNOSIS — Z7951 Long term (current) use of inhaled steroids: Secondary | ICD-10-CM | POA: Diagnosis not present

## 2023-04-05 DIAGNOSIS — Z7901 Long term (current) use of anticoagulants: Secondary | ICD-10-CM | POA: Diagnosis not present

## 2023-04-05 DIAGNOSIS — Z818 Family history of other mental and behavioral disorders: Secondary | ICD-10-CM

## 2023-04-05 DIAGNOSIS — R079 Chest pain, unspecified: Secondary | ICD-10-CM | POA: Diagnosis not present

## 2023-04-05 DIAGNOSIS — Z716 Tobacco abuse counseling: Secondary | ICD-10-CM

## 2023-04-05 DIAGNOSIS — F1010 Alcohol abuse, uncomplicated: Secondary | ICD-10-CM | POA: Diagnosis present

## 2023-04-05 DIAGNOSIS — F332 Major depressive disorder, recurrent severe without psychotic features: Secondary | ICD-10-CM | POA: Diagnosis present

## 2023-04-05 DIAGNOSIS — Z720 Tobacco use: Secondary | ICD-10-CM | POA: Diagnosis not present

## 2023-04-05 DIAGNOSIS — L03119 Cellulitis of unspecified part of limb: Secondary | ICD-10-CM | POA: Diagnosis not present

## 2023-04-05 DIAGNOSIS — R062 Wheezing: Secondary | ICD-10-CM | POA: Diagnosis not present

## 2023-04-05 DIAGNOSIS — I480 Paroxysmal atrial fibrillation: Secondary | ICD-10-CM | POA: Diagnosis present

## 2023-04-05 DIAGNOSIS — R0602 Shortness of breath: Secondary | ICD-10-CM | POA: Diagnosis not present

## 2023-04-05 DIAGNOSIS — M25572 Pain in left ankle and joints of left foot: Secondary | ICD-10-CM | POA: Diagnosis not present

## 2023-04-05 DIAGNOSIS — Z79899 Other long term (current) drug therapy: Secondary | ICD-10-CM

## 2023-04-05 DIAGNOSIS — F419 Anxiety disorder, unspecified: Secondary | ICD-10-CM | POA: Diagnosis present

## 2023-04-05 LAB — CBC WITH DIFFERENTIAL/PLATELET
Abs Immature Granulocytes: 0.03 10*3/uL (ref 0.00–0.07)
Basophils Absolute: 0 10*3/uL (ref 0.0–0.1)
Basophils Relative: 1 %
Eosinophils Absolute: 0.2 10*3/uL (ref 0.0–0.5)
Eosinophils Relative: 3 %
HCT: 36.5 % (ref 36.0–46.0)
Hemoglobin: 11.5 g/dL — ABNORMAL LOW (ref 12.0–15.0)
Immature Granulocytes: 1 %
Lymphocytes Relative: 43 %
Lymphs Abs: 2.3 10*3/uL (ref 0.7–4.0)
MCH: 29.1 pg (ref 26.0–34.0)
MCHC: 31.5 g/dL (ref 30.0–36.0)
MCV: 92.4 fL (ref 80.0–100.0)
Monocytes Absolute: 0.6 10*3/uL (ref 0.1–1.0)
Monocytes Relative: 11 %
Neutro Abs: 2.2 10*3/uL (ref 1.7–7.7)
Neutrophils Relative %: 41 %
Platelets: 253 10*3/uL (ref 150–400)
RBC: 3.95 MIL/uL (ref 3.87–5.11)
RDW: 17.8 % — ABNORMAL HIGH (ref 11.5–15.5)
WBC: 5.3 10*3/uL (ref 4.0–10.5)
nRBC: 0 % (ref 0.0–0.2)

## 2023-04-05 LAB — URINALYSIS, ROUTINE W REFLEX MICROSCOPIC
Bacteria, UA: NONE SEEN
Bilirubin Urine: NEGATIVE
Glucose, UA: NEGATIVE mg/dL
Hgb urine dipstick: NEGATIVE
Ketones, ur: NEGATIVE mg/dL
Nitrite: NEGATIVE
Protein, ur: NEGATIVE mg/dL
Specific Gravity, Urine: 1.002 — ABNORMAL LOW (ref 1.005–1.030)
pH: 7 (ref 5.0–8.0)

## 2023-04-05 NOTE — ED Triage Notes (Signed)
Per EMS  Edema Lower legs Worsening over the week SOB Ongoing for weeks Wheezing Vomiting Baseline with GERD Diarrhea Over a week  1 Duoneb 1 albuterol neb   97% oxygen on room air CBG 116 BP 114/68 Hr 90's  Walks with a walker baseline

## 2023-04-05 NOTE — Telephone Encounter (Signed)
LVM for patient to call and schedule appointment with an available provider for cellulitis.   Patient is to give more details as to what she wants to speak to Dr. Miquel Dunn about as well.  Glennie Hawk, CMA

## 2023-04-06 ENCOUNTER — Emergency Department (HOSPITAL_COMMUNITY): Payer: Medicare PPO

## 2023-04-06 ENCOUNTER — Encounter (HOSPITAL_COMMUNITY): Payer: Self-pay | Admitting: Emergency Medicine

## 2023-04-06 DIAGNOSIS — Z888 Allergy status to other drugs, medicaments and biological substances status: Secondary | ICD-10-CM | POA: Diagnosis not present

## 2023-04-06 DIAGNOSIS — F419 Anxiety disorder, unspecified: Secondary | ICD-10-CM | POA: Diagnosis present

## 2023-04-06 DIAGNOSIS — Z83438 Family history of other disorder of lipoprotein metabolism and other lipidemia: Secondary | ICD-10-CM | POA: Diagnosis not present

## 2023-04-06 DIAGNOSIS — D649 Anemia, unspecified: Secondary | ICD-10-CM | POA: Diagnosis present

## 2023-04-06 DIAGNOSIS — Z720 Tobacco use: Secondary | ICD-10-CM

## 2023-04-06 DIAGNOSIS — L03115 Cellulitis of right lower limb: Secondary | ICD-10-CM | POA: Diagnosis present

## 2023-04-06 DIAGNOSIS — Z79899 Other long term (current) drug therapy: Secondary | ICD-10-CM | POA: Diagnosis not present

## 2023-04-06 DIAGNOSIS — F332 Major depressive disorder, recurrent severe without psychotic features: Secondary | ICD-10-CM | POA: Diagnosis present

## 2023-04-06 DIAGNOSIS — E876 Hypokalemia: Secondary | ICD-10-CM

## 2023-04-06 DIAGNOSIS — F1010 Alcohol abuse, uncomplicated: Secondary | ICD-10-CM | POA: Diagnosis present

## 2023-04-06 DIAGNOSIS — K529 Noninfective gastroenteritis and colitis, unspecified: Secondary | ICD-10-CM

## 2023-04-06 DIAGNOSIS — I480 Paroxysmal atrial fibrillation: Secondary | ICD-10-CM

## 2023-04-06 DIAGNOSIS — L03116 Cellulitis of left lower limb: Secondary | ICD-10-CM

## 2023-04-06 DIAGNOSIS — G629 Polyneuropathy, unspecified: Secondary | ICD-10-CM | POA: Diagnosis present

## 2023-04-06 DIAGNOSIS — F17210 Nicotine dependence, cigarettes, uncomplicated: Secondary | ICD-10-CM | POA: Diagnosis present

## 2023-04-06 DIAGNOSIS — J449 Chronic obstructive pulmonary disease, unspecified: Secondary | ICD-10-CM | POA: Diagnosis present

## 2023-04-06 DIAGNOSIS — Z818 Family history of other mental and behavioral disorders: Secondary | ICD-10-CM | POA: Diagnosis not present

## 2023-04-06 DIAGNOSIS — L0390 Cellulitis, unspecified: Secondary | ICD-10-CM | POA: Diagnosis present

## 2023-04-06 DIAGNOSIS — Z8249 Family history of ischemic heart disease and other diseases of the circulatory system: Secondary | ICD-10-CM | POA: Diagnosis not present

## 2023-04-06 DIAGNOSIS — M545 Low back pain, unspecified: Secondary | ICD-10-CM | POA: Diagnosis present

## 2023-04-06 DIAGNOSIS — I10 Essential (primary) hypertension: Secondary | ICD-10-CM | POA: Diagnosis present

## 2023-04-06 DIAGNOSIS — K219 Gastro-esophageal reflux disease without esophagitis: Secondary | ICD-10-CM | POA: Diagnosis present

## 2023-04-06 DIAGNOSIS — Z7901 Long term (current) use of anticoagulants: Secondary | ICD-10-CM | POA: Diagnosis not present

## 2023-04-06 DIAGNOSIS — Z7951 Long term (current) use of inhaled steroids: Secondary | ICD-10-CM | POA: Diagnosis not present

## 2023-04-06 DIAGNOSIS — G8929 Other chronic pain: Secondary | ICD-10-CM | POA: Diagnosis present

## 2023-04-06 LAB — TROPONIN I (HIGH SENSITIVITY)
Troponin I (High Sensitivity): 8 ng/L (ref <18)
Troponin I (High Sensitivity): 8 ng/L (ref <18)

## 2023-04-06 LAB — CBC WITH DIFFERENTIAL/PLATELET
Abs Immature Granulocytes: 0.02 10*3/uL (ref 0.00–0.07)
Basophils Absolute: 0 10*3/uL (ref 0.0–0.1)
Basophils Relative: 0 %
Eosinophils Absolute: 0 10*3/uL (ref 0.0–0.5)
Eosinophils Relative: 0 %
HCT: 30.8 % — ABNORMAL LOW (ref 36.0–46.0)
Hemoglobin: 9.9 g/dL — ABNORMAL LOW (ref 12.0–15.0)
Immature Granulocytes: 1 %
Lymphocytes Relative: 8 %
Lymphs Abs: 0.3 10*3/uL — ABNORMAL LOW (ref 0.7–4.0)
MCH: 29 pg (ref 26.0–34.0)
MCHC: 32.1 g/dL (ref 30.0–36.0)
MCV: 90.3 fL (ref 80.0–100.0)
Monocytes Absolute: 0.1 10*3/uL (ref 0.1–1.0)
Monocytes Relative: 1 %
Neutro Abs: 3.3 10*3/uL (ref 1.7–7.7)
Neutrophils Relative %: 90 %
Platelets: 189 10*3/uL (ref 150–400)
RBC: 3.41 MIL/uL — ABNORMAL LOW (ref 3.87–5.11)
RDW: 18 % — ABNORMAL HIGH (ref 11.5–15.5)
WBC: 3.7 10*3/uL — ABNORMAL LOW (ref 4.0–10.5)
nRBC: 0 % (ref 0.0–0.2)

## 2023-04-06 LAB — BASIC METABOLIC PANEL
Anion gap: 12 (ref 5–15)
BUN: <5 mg/dL — ABNORMAL LOW (ref 8–23)
CO2: 23 mmol/L (ref 22–32)
Calcium: 7.2 mg/dL — ABNORMAL LOW (ref 8.9–10.3)
Chloride: 102 mmol/L (ref 98–111)
Creatinine, Ser: 0.66 mg/dL (ref 0.44–1.00)
GFR, Estimated: >60 mL/min (ref >60)
Glucose, Bld: 208 mg/dL — ABNORMAL HIGH (ref 70–99)
Potassium: 2.9 mmol/L — ABNORMAL LOW (ref 3.5–5.1)
Sodium: 137 mmol/L (ref 135–145)

## 2023-04-06 LAB — MAGNESIUM
Magnesium: 1 mg/dL — ABNORMAL LOW (ref 1.7–2.4)
Magnesium: 0.9 mg/dL — CL (ref 1.7–2.4)

## 2023-04-06 LAB — COMPREHENSIVE METABOLIC PANEL
ALT: 17 U/L (ref 0–44)
AST: 30 U/L (ref 15–41)
Albumin: 2.8 g/dL — ABNORMAL LOW (ref 3.5–5.0)
Alkaline Phosphatase: 160 U/L — ABNORMAL HIGH (ref 38–126)
Anion gap: 14 (ref 5–15)
BUN: <5 mg/dL — ABNORMAL LOW (ref 8–23)
CO2: 21 mmol/L — ABNORMAL LOW (ref 22–32)
Calcium: 7.4 mg/dL — ABNORMAL LOW (ref 8.9–10.3)
Chloride: 102 mmol/L (ref 98–111)
Creatinine, Ser: 0.54 mg/dL (ref 0.44–1.00)
GFR, Estimated: >60 mL/min (ref >60)
Glucose, Bld: 135 mg/dL — ABNORMAL HIGH (ref 70–99)
Potassium: 2 mmol/L — CL (ref 3.5–5.1)
Sodium: 137 mmol/L (ref 135–145)
Total Bilirubin: 0.5 mg/dL (ref 0.3–1.2)
Total Protein: 6.2 g/dL — ABNORMAL LOW (ref 6.5–8.1)

## 2023-04-06 LAB — BRAIN NATRIURETIC PEPTIDE: B Natriuretic Peptide: 150 pg/mL — ABNORMAL HIGH (ref 0.0–100.0)

## 2023-04-06 LAB — ETHANOL: Alcohol, Ethyl (B): 134 mg/dL — ABNORMAL HIGH (ref <10)

## 2023-04-06 LAB — FOLATE: Folate: 4.1 ng/mL — ABNORMAL LOW (ref >5.9)

## 2023-04-06 LAB — VITAMIN B12: Vitamin B-12: 247 pg/mL (ref 180–914)

## 2023-04-06 LAB — FERRITIN: Ferritin: 24 ng/mL (ref 11–307)

## 2023-04-06 MED ORDER — SODIUM CHLORIDE 0.9 % IV SOLN: 1.0000 g | INTRAVENOUS | Status: DC

## 2023-04-06 MED ORDER — BACID PO TABS: 2.0000 | ORAL_TABLET | Freq: Three times a day (TID) | ORAL | Status: DC

## 2023-04-06 MED ORDER — CLONAZEPAM 0.5 MG PO TABS
0.5000 mg | ORAL_TABLET | Freq: Once | ORAL | Status: AC
  Administered 2023-04-06: 0.5 mg via ORAL
  Filled 2023-04-06: qty 1

## 2023-04-06 MED ORDER — BISMUTH SUBSALICYLATE 262 MG/15ML PO SUSP
30.0000 mL | Freq: Three times a day (TID) | ORAL | Status: DC
  Administered 2023-04-06 – 2023-04-09 (×12): 30 mL via ORAL
  Filled 2023-04-06 (×2): qty 236

## 2023-04-06 MED ORDER — ACETAMINOPHEN 325 MG PO TABS
650.0000 mg | ORAL_TABLET | Freq: Four times a day (QID) | ORAL | Status: DC | PRN
  Administered 2023-04-06 – 2023-04-09 (×7): 650 mg via ORAL
  Filled 2023-04-06 (×7): qty 2

## 2023-04-06 MED ORDER — MAGNESIUM SULFATE 2 GM/50ML IV SOLN: 2.0000 g | Freq: Once | INTRAVENOUS | Status: DC

## 2023-04-06 MED ORDER — TRAZODONE HCL 100 MG PO TABS
50.0000 mg | ORAL_TABLET | Freq: Every evening | ORAL | Status: DC | PRN
  Administered 2023-04-06 – 2023-04-08 (×3): 50 mg via ORAL
  Filled 2023-04-06 (×3): qty 1

## 2023-04-06 MED ORDER — SODIUM CHLORIDE 0.9 % IV SOLN
100.0000 mg | Freq: Two times a day (BID) | INTRAVENOUS | Status: DC
  Administered 2023-04-06 – 2023-04-07 (×3): 100 mg via INTRAVENOUS
  Filled 2023-04-06 (×3): qty 100

## 2023-04-06 MED ORDER — LOPERAMIDE HCL 2 MG PO CAPS
4.0000 mg | ORAL_CAPSULE | Freq: Once | ORAL | Status: AC
  Administered 2023-04-06: 4 mg via ORAL
  Filled 2023-04-06: qty 2

## 2023-04-06 MED ORDER — DIPHENOXYLATE-ATROPINE 2.5-0.025 MG PO TABS
2.0000 | ORAL_TABLET | Freq: Four times a day (QID) | ORAL | Status: DC | PRN
  Administered 2023-04-06 – 2023-04-08 (×3): 2 via ORAL
  Filled 2023-04-06 (×4): qty 2

## 2023-04-06 MED ORDER — MAGNESIUM SULFATE 2 GM/50ML IV SOLN
2.0000 g | Freq: Once | INTRAVENOUS | Status: AC
  Administered 2023-04-06: 2 g via INTRAVENOUS
  Filled 2023-04-06: qty 50

## 2023-04-06 MED ORDER — GABAPENTIN 400 MG PO CAPS
800.0000 mg | ORAL_CAPSULE | Freq: Three times a day (TID) | ORAL | Status: DC
Start: 2023-04-06 — End: 2023-04-09
  Administered 2023-04-06 – 2023-04-09 (×10): 800 mg via ORAL
  Filled 2023-04-06 (×10): qty 2

## 2023-04-06 MED ORDER — APIXABAN 5 MG PO TABS
5.0000 mg | ORAL_TABLET | Freq: Two times a day (BID) | ORAL | Status: DC
  Administered 2023-04-06 – 2023-04-09 (×6): 5 mg via ORAL
  Filled 2023-04-06 (×6): qty 1

## 2023-04-06 MED ORDER — POTASSIUM CHLORIDE CRYS ER 20 MEQ PO TBCR
40.0000 meq | EXTENDED_RELEASE_TABLET | ORAL | Status: DC
  Administered 2023-04-06: 40 meq via ORAL
  Filled 2023-04-06: qty 2

## 2023-04-06 MED ORDER — PANTOPRAZOLE SODIUM 40 MG PO TBEC
40.0000 mg | DELAYED_RELEASE_TABLET | Freq: Every day | ORAL | Status: DC
  Administered 2023-04-06 – 2023-04-09 (×4): 40 mg via ORAL
  Filled 2023-04-06 (×4): qty 1

## 2023-04-06 MED ORDER — OXCARBAZEPINE 150 MG PO TABS
150.0000 mg | ORAL_TABLET | Freq: Two times a day (BID) | ORAL | Status: DC
  Administered 2023-04-06 – 2023-04-09 (×7): 150 mg via ORAL
  Filled 2023-04-06 (×7): qty 1

## 2023-04-06 MED ORDER — FERROUS SULFATE 325 (65 FE) MG PO TABS
325.0000 mg | ORAL_TABLET | ORAL | Status: DC
  Administered 2023-04-07 – 2023-04-09 (×2): 325 mg via ORAL
  Filled 2023-04-06 (×2): qty 1

## 2023-04-06 MED ORDER — SENNOSIDES-DOCUSATE SODIUM 8.6-50 MG PO TABS: 1.0000 | ORAL_TABLET | Freq: Every evening | ORAL | Status: DC | PRN

## 2023-04-06 MED ORDER — POTASSIUM CHLORIDE 20 MEQ PO PACK
40.0000 meq | PACK | Freq: Once | ORAL | Status: AC
  Administered 2023-04-06: 40 meq via ORAL
  Filled 2023-04-06: qty 2

## 2023-04-06 MED ORDER — NICOTINE 14 MG/24HR TD PT24
14.0000 mg | MEDICATED_PATCH | Freq: Every day | TRANSDERMAL | Status: DC
Start: 2023-04-06 — End: 2023-04-09
  Administered 2023-04-06 – 2023-04-09 (×4): 14 mg via TRANSDERMAL
  Filled 2023-04-06 (×4): qty 1

## 2023-04-06 MED ORDER — BUSPIRONE HCL 10 MG PO TABS
5.0000 mg | ORAL_TABLET | Freq: Two times a day (BID) | ORAL | Status: DC
  Administered 2023-04-06 – 2023-04-09 (×6): 5 mg via ORAL
  Filled 2023-04-06 (×6): qty 1

## 2023-04-06 MED ORDER — HYDROCODONE-ACETAMINOPHEN 5-325 MG PO TABS
1.0000 | ORAL_TABLET | Freq: Once | ORAL | Status: AC
Start: 2023-04-06 — End: 2023-04-06
  Administered 2023-04-06: 1 via ORAL
  Filled 2023-04-06: qty 1

## 2023-04-06 MED ORDER — ESCITALOPRAM OXALATE 10 MG PO TABS
20.0000 mg | ORAL_TABLET | Freq: Every day | ORAL | Status: DC
  Administered 2023-04-06 – 2023-04-09 (×4): 20 mg via ORAL
  Filled 2023-04-06 (×4): qty 2

## 2023-04-06 MED ORDER — HEPARIN SODIUM (PORCINE) 5000 UNIT/ML IJ SOLN
5000.0000 [IU] | Freq: Three times a day (TID) | INTRAMUSCULAR | Status: DC
  Administered 2023-04-06: 5000 [IU] via SUBCUTANEOUS
  Filled 2023-04-06: qty 1

## 2023-04-06 MED ORDER — MAGNESIUM SULFATE 2 GM/50ML IV SOLN
2.0000 g | Freq: Once | INTRAVENOUS | Status: AC
Start: 2023-04-06 — End: 2023-04-06
  Administered 2023-04-06: 2 g via INTRAVENOUS
  Filled 2023-04-06: qty 50

## 2023-04-06 MED ORDER — CLONAZEPAM 0.5 MG PO TABS
0.5000 mg | ORAL_TABLET | Freq: Two times a day (BID) | ORAL | Status: DC | PRN
  Administered 2023-04-06 – 2023-04-08 (×5): 0.5 mg via ORAL
  Filled 2023-04-06 (×5): qty 1

## 2023-04-06 MED ORDER — FLUTICASONE FUROATE-VILANTEROL 100-25 MCG/ACT IN AEPB
1.0000 | INHALATION_SPRAY | Freq: Every day | RESPIRATORY_TRACT | Status: DC
  Administered 2023-04-07 – 2023-04-09 (×3): 1 via RESPIRATORY_TRACT
  Filled 2023-04-06: qty 28

## 2023-04-06 MED ORDER — UMECLIDINIUM BROMIDE 62.5 MCG/ACT IN AEPB
1.0000 | INHALATION_SPRAY | Freq: Every day | RESPIRATORY_TRACT | Status: DC
  Administered 2023-04-07 – 2023-04-09 (×3): 1 via RESPIRATORY_TRACT
  Filled 2023-04-06: qty 7

## 2023-04-06 MED ORDER — LOPERAMIDE HCL 2 MG PO CAPS: 2.0000 mg | ORAL_CAPSULE | ORAL | Status: DC | PRN

## 2023-04-06 MED ORDER — AMIODARONE HCL 200 MG PO TABS
200.0000 mg | ORAL_TABLET | Freq: Every day | ORAL | Status: DC
  Administered 2023-04-06 – 2023-04-09 (×4): 200 mg via ORAL
  Filled 2023-04-06 (×4): qty 1

## 2023-04-06 MED ORDER — VITAMIN D 25 MCG (1000 UNIT) PO TABS
1000.0000 [IU] | ORAL_TABLET | Freq: Every day | ORAL | Status: DC
  Administered 2023-04-06 – 2023-04-09 (×4): 1000 [IU] via ORAL
  Filled 2023-04-06 (×4): qty 1

## 2023-04-06 MED ORDER — RISAQUAD PO CAPS
2.0000 | ORAL_CAPSULE | Freq: Every day | ORAL | Status: DC
  Administered 2023-04-06 – 2023-04-09 (×4): 2 via ORAL
  Filled 2023-04-06 (×3): qty 2

## 2023-04-06 MED ORDER — CEFTRIAXONE SODIUM 1 G IJ SOLR
1.0000 g | Freq: Once | INTRAMUSCULAR | Status: AC
  Administered 2023-04-06: 1 g via INTRAVENOUS
  Filled 2023-04-06: qty 10

## 2023-04-06 MED ORDER — MAGNESIUM OXIDE -MG SUPPLEMENT 400 (240 MG) MG PO TABS
400.0000 mg | ORAL_TABLET | Freq: Two times a day (BID) | ORAL | Status: AC
  Administered 2023-04-06 (×2): 400 mg via ORAL
  Filled 2023-04-06 (×2): qty 1

## 2023-04-06 MED ORDER — BISMUTH SUBSALICYLATE 262 MG PO CHEW
524.0000 mg | CHEWABLE_TABLET | Freq: Once | ORAL | Status: AC
  Administered 2023-04-06: 524 mg via ORAL
  Filled 2023-04-06: qty 2

## 2023-04-06 MED ORDER — POTASSIUM CHLORIDE CRYS ER 20 MEQ PO TBCR
40.0000 meq | EXTENDED_RELEASE_TABLET | Freq: Two times a day (BID) | ORAL | Status: AC
  Administered 2023-04-06 (×2): 40 meq via ORAL
  Filled 2023-04-06 (×2): qty 2

## 2023-04-06 MED ORDER — ACETAMINOPHEN 650 MG RE SUPP: 650.0000 mg | Freq: Four times a day (QID) | RECTAL | Status: DC | PRN

## 2023-04-06 MED ORDER — POTASSIUM CHLORIDE 10 MEQ/100ML IV SOLN
10.0000 meq | INTRAVENOUS | Status: AC
Start: 2023-04-06 — End: 2023-04-06
  Administered 2023-04-06 (×2): 10 meq via INTRAVENOUS
  Filled 2023-04-06 (×2): qty 100

## 2023-04-06 MED ORDER — GABAPENTIN 400 MG PO CAPS
800.0000 mg | ORAL_CAPSULE | Freq: Once | ORAL | Status: AC
  Administered 2023-04-06: 800 mg via ORAL
  Filled 2023-04-06: qty 2

## 2023-04-06 NOTE — TOC Initial Note (Signed)
Transition of Care [facility]) - Initial/Assessment Note   Patient Details  Name: Karen Casey MRN: [id number] Date of Birth: April 11, 1956  [facility] CM/SW Contact:    Ewing Schlein, LCSW Phone Number: 04/06/2023, 10:44 AM  Clinical Narrative: Patient is from home and is currently on IV antibiotics. TOC following for possible discharge needs.                Expected Discharge Plan: Home/Self Care Barriers to Discharge: Continued Medical Work up  Expected Discharge Plan and Services In-house Referral: Clinical Social Work Living arrangements for the past 2 months: Single Family Home              DME Arranged: N/A DME Agency: NA  Prior Living Arrangements/Services Living arrangements for the past 2 months: Single Family Home Lives with:: Self Patient language and need for interpreter reviewed:: Yes Need for Family Participation in Patient Care: No (Comment) Care giver support system in place?: Yes (comment) Criminal Activity/Legal Involvement Pertinent to Current Situation/Hospitalization: No - Comment as needed  Emotional Assessment Orientation: : Oriented to Self, Oriented to Place, Oriented to  Time, Oriented to Situation Alcohol / Substance Use: Not Applicable Psych Involvement: No (comment)  Admission diagnosis:  Hypokalemia [E87.6] Cellulitis [L03.90] Cellulitis of left lower extremity [L03.116] Patient Active Problem List   Diagnosis Date Noted   Cellulitis 04/06/2023   Pleural effusion on right 01/31/2023   Right leg swelling 01/07/2023   MDD (major depressive disorder), recurrent severe, without psychosis (HCC) 12/06/2022   Severe recurrent major depression without psychotic features (HCC) 11/29/2022   Anemia 10/29/2022   Grief reaction 07/29/2022   Hyponatremia 07/27/2022   Severe episode of recurrent major depressive disorder, 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   Nausea vomiting and diarrhea 02/09/2022   Colitis    Acute respiratory  failure with hypoxia (HCC)    Right rib fracture 11/20/2021   Elevated LFTs 11/20/2021   Osteoporosis 09/23/2021   Vitamin D deficiency 07/03/2021   Peripheral neuropathy    Seborrheic keratoses 05/29/2021   Hepatic steatosis 01/05/2021   Hypokalemia 01/02/2021   COPD (chronic obstructive pulmonary disease) (HCC) 12/09/2020   Chronic pain of breast 11/12/2020   Compression fracture of body of thoracic vertebra (HCC) 11/06/2020   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 [place] [address] Phone: [phone] Fax: [phone]  Social Determinants of Health (SDOH) Social History: SDOH Screenings   Food Insecurity: No Food Insecurity (01/31/2023)  Housing: Low Risk  (01/31/2023)  Recent Concern: Housing - Medium Risk (11/28/2022)  Transportation Needs: Unmet Transportation Needs (02/02/2023)  Utilities: Not At Risk (01/31/2023)  Alcohol Screen: Low Risk  (11/28/2022)  Depression (PHQ2-9): High Risk (03/04/2023)  Financial Resource Strain: Medium Risk (10/23/2022)  Physical Activity: Inactive (10/23/2022)  Social Connections: Socially Isolated (10/23/2022)  Stress: Stress Concern Present (10/23/2022)  Tobacco Use: High Risk (04/06/2023)   SDOH Interventions:    Readmission Risk Interventions    04/06/2023   10:10 AM 07/28/2022  1:21 PM 01/26/2022   11:11 AM  Readmission Risk Prevention Plan  Transportation Screening Complete Complete Complete  PCP or  Specialist Appt within 3-5 Days  Complete   HRI or Home Care Consult Complete Complete   Social Work Consult for Recovery Care Planning/Counseling Complete Complete   Palliative Care Screening Not Applicable Not Applicable   Medication Review Oceanographer) Complete Complete   PCP or Specialist appointment within 3-5 days of discharge   Complete  HRI or Home Care Consult   Complete  SW Recovery Care/Counseling Consult   Complete  Palliative Care Screening   Not Applicable  Skilled Nursing Facility   Not Applicable

## 2023-04-06 NOTE — ED Notes (Signed)
Pt requested to use the bedpan assisted with cleaning pt

## 2023-04-06 NOTE — Progress Notes (Signed)
[facility] PROGRESS NOTE    Progress Note  Karen Casey  UYQ:[id number] DOB: Feb 17, 1956 DOA: 04/05/2023 PCP: Billey Co, MD     Brief Narrative:   Karen Casey is an 67 y.o. 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  C. difficile PCR on the ED visit on 03/13/2023 was negative, also complaining of red patches on her lower extremity painful   Assessment/Plan:   Electrolyte imbalance hypokalemia/hypomagnesemia: Replete potassium and magnesium orally recheck tomorrow morning. This is is likely due to diarrhea.  Bilateral lower extremity Cellulitis She has remained afebrile with no leukocytosis. Discontinue Rocephin start IV doxycycline.  Chronic diarrhea: C. difficile PCR - 03/04/2023, GI panel has been ordered.  Continue normocytic anemia Check a B12 and folate.  Tobacco use Counseling.  Paroxysmal atrial fibrillation (HCC) Continue amiodarone and Eliquis. Not on any rate controlling medication.  Elevated blood pressure without a diagnosis of hypertension: Blood pressure is well-controlled today.  Depression with anxiety Continue current regimen.  Peripheral neuropathy: Continue Neurontin.   DVT prophylaxis: Eliquis Family Communication:none Status is: Inpatient Remains inpatient appropriate because: Electrolyte imbalance hypokalemia/hypomagnesemia    Code Status:     Code Status Orders  (From admission, onward)           Start     Ordered   04/06/23 0457  Full code  Continuous       Question:  By:  Answer:  Consent: discussion documented in EHR   04/06/23 0457           Code Status History     Date Active Date Inactive Code Status Order ID Comments User Context   01/31/2023 2204 02/03/2023 1656 Full Code  [id number]  Charlsie Quest, MD ED   12/06/2022 1409 12/06/2022 1942 Full Code [id number]  Nelly Rout, MD Inpatient   11/27/2022 2056 11/28/2022 1659 Full Code [id number]  Maricela Bo, NP ED   07/27/2022 1512 07/30/2022 1632 Full Code [id number]  Bobette Mo, MD ED   07/26/2022 0518 07/27/2022 1512 Full Code [id number]  Garlon Hatchet, PA-C ED   02/09/2022 0002 02/09/2022 2304 Full Code [id number]  Glendale Chard, DO ED   01/25/2022 1733 01/30/2022 0149 Full Code [id number]  Alfredo Martinez, MD ED   01/02/2022 1350 01/07/2022 2320 Full Code [id number]  Margie Ege A, DO ED   11/21/2021 0034 12/01/2021 2222 Full Code [id number]  Carollee Herter, DO ED   11/20/2021 2353 11/21/2021 0034 Full Code [id number]  Carollee Herter, DO ED   07/27/2021 1630 07/29/2021 2003 Full Code [id number]  Alfredo Martinez, MD ED   06/21/2021 0420 06/24/2021 1630 Full Code [id number]  Evelena Leyden, DO ED   06/21/2021 0330 06/21/2021 0420 Full Code [id number]  Dione Booze, MD ED   01/02/2021 2136 01/05/2021 2205 Full Code [id number]  Towanda Octave, MD ED   10/12/2020 2105 10/17/2020 2217 Full Code [id number]  Gertha Calkin, MD ED   09/25/2020 1749 09/28/2020 1913 Full Code [id number]  Littie Deeds, MD ED   09/10/2020 0011 09/22/2020 2234 Full Code [id number]  Tim Lair, PA-C ED   09/09/2020 2129 09/10/2020 0011 Full Code [id number]  Pollyann Savoy, MD ED   09/27/2017 0215 09/29/2017 1547 Full Code [id number]  Hugelmeyer, Jon Gills, DO Inpatient   08/03/2012 1943 08/04/2012 0545 Full Code [id number]  Remi Haggard, NP ED   07/11/2012 0006 07/11/2012 1553 Full Code [id number]  Magnus Sinning, PA-C ED   05/25/2012 2044 05/26/2012 0913 Full Code [id number]  Arthor Captain, PA-C ED         IV Access:   Peripheral IV   Procedures and diagnostic studies:   DG Tibia/Fibula Left  Result Date: 04/06/2023 CLINICAL DATA:  144615 Pain 144615 EXAM: LEFT TIBIA AND FIBULA - 2 VIEW COMPARISON:  None Available. FINDINGS: There is no evidence of fracture or other focal  bone lesions. Soft tissues are unremarkable. IMPRESSION: Negative. Electronically Signed   By: Tish Frederickson M.D.   On: 04/06/2023 01:45   DG Ankle Complete Left  Result Date: 04/06/2023 CLINICAL DATA:  leg swelling/pain, cellulitis EXAM: LEFT ANKLE COMPLETE - 3+ VIEW COMPARISON:  X-ray left ankle 08/07/2021 FINDINGS: There is no evidence of fracture, dislocation, or joint effusion. There is no evidence of arthropathy or other focal bone abnormality. Soft tissues are unremarkable. IMPRESSION: No acute displaced fracture or dislocation. Electronically Signed   By: Tish Frederickson M.D.   On: 04/06/2023 01:35   DG Chest Portable 1 View  Result Date: 04/06/2023 CLINICAL DATA:  shortness of breath.  Swelling/ pain and cellulitis EXAM: PORTABLE CHEST 1 VIEW COMPARISON:  Chest x-ray 03/13/2023, CT chest 01/28/2023, chest x-ray 01/31/2023 FINDINGS: The heart and mediastinal contours are unchanged. Atherosclerotic plaque. No focal consolidation. No pulmonary edema. No pleural effusion. No pneumothorax. No acute osseous abnormality. IMPRESSION: 1. No active disease. 2.  Aortic Atherosclerosis (ICD10-I70.0). Electronically Signed   By: Tish Frederickson M.D.   On: 04/06/2023 01:31     Medical Consultants:   None.   Subjective:    Karen Casey relates still feels weak still having diarrhea  Objective:    Vitals:   04/06/23 0300 04/06/23 0400 04/06/23 0600 04/06/23 0648  BP:  137/83 (!) 110/57   Pulse:  (!) 103 (!) 103   Resp:  20 19   Temp: 97.6 F (36.4 C)   97.6 F (36.4 C)  TempSrc: Oral   Oral  SpO2:  98% 99%   Weight:      Height:       SpO2: 99 %   Intake/Output Summary (Last 24 hours) at 04/06/2023 0651 Last data filed at 04/06/2023 0630 Gross per 24 hour  Intake 450 ml  Output --  Net 450 ml   Filed Weights   04/05/23 2310  Weight: 65.3 kg    Exam: General exam: In no acute distress. Respiratory system: Good air movement and clear to  auscultation. Cardiovascular system: S1 & S2 heard, RRR. No JVD. Gastrointestinal system: Abdomen is nondistended, soft and nontender.  Extremities: No pedal edema. Skin: No rashes, lesions or ulcers Psychiatry: Judgement and insight appear normal. Mood & affect appropriate.    Data Reviewed:    Labs: Basic Metabolic Panel: Recent Labs  Lab 04/05/23 2330 04/06/23 0059 04/06/23 0521  NA 137  --  137  K 2.0*  --  2.9*  CL 102  --  102  CO2 21*  --  23  GLUCOSE 135*  --  208*  BUN <5*  --  <5*  CREATININE 0.54  --  0.66  CALCIUM 7.4*  --  7.2*  MG  --  1.0* 0.9*   GFR Estimated Creatinine Clearance: 61.3 mL/min (by C-G formula based on SCr of 0.66 mg/dL). Liver Function Tests: Recent Labs  Lab 04/05/23 2330  AST 30  ALT 17  ALKPHOS 160*  BILITOT 0.5  PROT 6.2*  ALBUMIN 2.8*   No results for input(s): "LIPASE", "AMYLASE" in the last 168 hours. No results for input(s): "AMMONIA" in the last 168 hours. Coagulation profile No results for input(s): "INR", "PROTIME" in the last 168 hours. COVID-19 Labs  No results for input(s): "DDIMER", "FERRITIN", "LDH", "CRP" in the last 72 hours.  Lab Results  Component Value Date   SARSCOV2NAA NEGATIVE 01/31/2023   SARSCOV2NAA NEGATIVE 07/26/2022   SARSCOV2NAA NEGATIVE 02/08/2022   SARSCOV2NAA NEGATIVE 07/28/2021    CBC: Recent Labs  Lab 04/05/23 2330 04/06/23 0521  WBC 5.3 3.7*  NEUTROABS 2.2 3.3  HGB 11.5* 9.9*  HCT 36.5 30.8*  MCV 92.4 90.3  PLT 253 189   Cardiac Enzymes: No results for input(s): "CKTOTAL", "CKMB", "CKMBINDEX", "TROPONINI" in the last 168 hours. BNP (last 3 results) No results for input(s): "PROBNP" in the last 8760 hours. CBG: No results for input(s): "GLUCAP" in the last 168 hours. D-Dimer: No results for input(s): "DDIMER" in the last 72 hours. Hgb A1c: No results for input(s): "HGBA1C" in the last 72 hours. Lipid Profile: No results for input(s): "CHOL", "HDL", "LDLCALC", "TRIG",  "CHOLHDL", "LDLDIRECT" in the last 72 hours. Thyroid function studies: No results for input(s): "TSH", "T4TOTAL", "T3FREE", "THYROIDAB" in the last 72 hours.  Invalid input(s): "FREET3" Anemia work up: No results for input(s): "VITAMINB12", "FOLATE", "FERRITIN", "TIBC", "IRON", "RETICCTPCT" in the last 72 hours. Sepsis Labs: Recent Labs  Lab 04/05/23 2330 04/06/23 0521  WBC 5.3 3.7*   Microbiology No results found for this or any previous visit (from the past 240 hour(s)).   Medications:    amiodarone  200 mg Oral Daily   apixaban  5 mg Oral BID   bismuth subsalicylate  30 mL Oral TID AC & HS   busPIRone  5 mg Oral BID   escitalopram  20 mg Oral Daily   ferrous sulfate  325 mg Oral QODAY   fluticasone furoate-vilanterol  1 puff Inhalation Daily   And   umeclidinium bromide  1 puff Inhalation Daily   gabapentin  800 mg Oral TID   lactobacillus acidophilus  2 tablet Oral TID   nicotine  14 mg Transdermal Daily   potassium chloride  40 mEq Oral Q1 Hr x 2   Continuous Infusions:  [START ON 04/07/2023] cefTRIAXone (ROCEPHIN)  IV     magnesium sulfate bolus IVPB 2 g (04/06/23 0635)      LOS: 0 days   Marinda Elk  [facility]  04/06/2023, 6:51 AM

## 2023-04-06 NOTE — H&P (Signed)
PCP:   Billey Co, MD   Chief Complaint:  Not feeling good  HPI: This is a 67 year old female with past medical history of COPD, tobacco use, anxiety and depression, chronic diarrhea, chronic low back pain with sciatica, paroxysmal atrial fibrillation, peripheral neuropathy.  Patient presents with complaint of diarrhea.  Patient has chronic diarrhea but states has been much worse over the last few weeks.  She uses Imodium typically but states this has not been working recently.  He follows with gastroenterology for this.  On the most recent ER visits his C. difficile toxin done 9/29, it was negative.  She presents today's with simply not feeling good.  Per patient she has been in bed for the past 3 days as a result.  She has red patches on her lower extremity.  She states is painful to walk.  She states she hates her leg without knowing due to peripheral neuropathy.  This redness started approximately 2 weeks ago.  She endorses slight chills but no fever.  In the ER patient vitals are stable, afebrile.  Potassium 2.0, magnesium 1.0.  Alk phos 160, this is baseline.  Bilateral lower extremity with redness and scratches.  Review of Systems:  Per HPI  Past Medical History: Past Medical History:  Diagnosis Date   Alcohol abuse    last use 03/09/21, marijuana last 03/09/21   Allergy    Anxiety    Cataract 06/09/2012   Right eye and left eye   COPD (chronic obstructive pulmonary disease) (HCC)    Depression    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/04/2020   Due to benzodiazepine withdrawal   Dyspnea    uses oxygen 2L via [place] prn   Enterococcal bacteremia 02/10/2022   GERD (gastroesophageal reflux disease)    Headache    Hypertension    Long term (current) use of anticoagulants    Nausea vomiting and diarrhea 02/09/2022   Neuromuscular disorder (HCC)    Neuropathy    Paroxysmal atrial fibrillation (HCC)    Positive blood culture 02/10/2022   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   Event occurred in 2007   Seizure (HCC)    08/2020 per patient   Seizures (HCC)    xanax withdrawl- December 2013   Urinary incontinence 06/09/2012   Past Surgical History:  Procedure Laterality Date   APPENDECTOMY     BIOPSY  01/04/2021   Procedure: BIOPSY;  Surgeon: Benancio Deeds, MD;  Location: [facility] ENDOSCOPY;  Service: Gastroenterology;;   BIOPSY  03/12/2021   Procedure: BIOPSY;  Surgeon: Lemar Lofty., MD;  Location: [facility] ENDOSCOPY;  Service: Gastroenterology;;   CARDIOVERSION N/A 01/28/2022   Procedure: CARDIOVERSION;  Surgeon: Yates Decamp, MD;  Location: [facility] ENDOSCOPY;  Service: Cardiovascular;  Laterality: N/A;   CATARACT EXTRACTION  06/09/2012   Left eye   COLONOSCOPY WITH PROPOFOL N/A 03/12/2021   Procedure: COLONOSCOPY WITH PROPOFOL;  Surgeon: Lemar Lofty., MD;  Location: [facility] ENDOSCOPY;  Service: Gastroenterology;  Laterality: N/A;   ESOPHAGOGASTRODUODENOSCOPY (EGD) WITH PROPOFOL N/A 01/04/2021   Procedure: ESOPHAGOGASTRODUODENOSCOPY (EGD) WITH PROPOFOL;  Surgeon: Benancio Deeds, MD;  Location: [facility] ENDOSCOPY;  Service: Gastroenterology;  Laterality: N/A;   left shoulder dislocation  Sept 2011   POLYPECTOMY  03/12/2021   Procedure: POLYPECTOMY;  Surgeon: Mansouraty, Netty Starring., MD;  Location: [facility] ENDOSCOPY;  Service: Gastroenterology;;    Medications: Prior to Admission medications   Medication Sig Start Date End Date Taking? Authorizing Provider  albuterol (VENTOLIN HFA) 108 (90 Base) MCG/ACT  inhaler Inhale 2 puffs into the lungs every 6 (six) hours as needed for wheezing or shortness of breath. 10/29/22   Billey Co, MD  amiodarone (PACERONE) 200 MG tablet Take 1 tablet (200 mg total) by mouth daily. 12/06/22   Lewanda Rife, MD  apixaban (ELIQUIS) 5 MG TABS tablet Take 1 tablet (5 mg total) by mouth 2 (two) times daily. 12/06/22   Lewanda Rife, MD  busPIRone (BUSPAR) 5 MG tablet Take 5 mg by mouth 2 (two) times daily. 01/04/23    [provider]  cholecalciferol (VITAMIN D3) 25 MCG (1000 UNIT) tablet Take 1,000 Units by mouth daily.    [provider]  clonazePAM (KLONOPIN) 0.5 MG tablet Take 1 tablet (0.5 mg total) by mouth 2 (two) times daily as needed for anxiety. Ok to fill 12/29/22 12/28/22   Billey Co, MD  denosumab (PROLIA) 60 MG/ML SOSY injection Inject 60 mg into the skin every 6 (six) months. 02/11/23   Billey Co, MD  escitalopram (LEXAPRO) 20 MG tablet Take 1 tablet (20 mg total) by mouth daily. 02/11/23   Billey Co, MD  famotidine (PEPCID) 20 MG tablet Take 1 tablet (20 mg total) by mouth 2 (two) times daily. 03/21/23   Westley Chandler, MD  ferrous sulfate 325 (65 FE) MG tablet Take 1 tablet (325 mg total) by mouth every other day. 12/06/22   Lewanda Rife, MD  Fluticasone-Umeclidin-Vilant (TRELEGY ELLIPTA) 100-62.5-25 MCG/ACT AEPB Inhale 1 puff into the lungs daily. 10/29/22   Billey Co, MD  gabapentin (NEURONTIN) 400 MG capsule Take 2 capsules (800 mg total) by mouth 3 (three) times daily. 03/21/23   Westley Chandler, MD  guaiFENesin (ROBITUSSIN) 100 MG/5ML liquid Take 10 mLs by mouth every 6 (six) hours as needed for cough or to loosen phlegm. Patient not taking: Reported on 03/12/2023 02/03/23   Dorcas Carrow, MD  lidocaine 4 % Place 1 patch onto the skin daily. Patient taking differently: Place 1 patch onto the skin daily as needed. 03/08/23   Billey Co, MD  loperamide (IMODIUM) 2 MG capsule Take 1 capsule (2 mg total) by mouth 4 (four) times daily as needed for diarrhea or loose stools. 03/13/23   Carroll Sage, PA-C  Multiple Vitamins-Minerals (CENTRUM ADULT PO) Take 1 tablet by mouth daily.    [provider]  nicotine (NICODERM CQ - DOSED IN MG/24 HOURS) 21 mg/24hr patch Place 1 patch (21 mg total) onto the skin daily. Patient not taking: Reported on 03/12/2023 02/03/23   Dorcas Carrow, MD  ondansetron (ZOFRAN) 4 MG tablet Take 1 tablet (4 mg  total) by mouth every 6 (six) hours. 03/13/23   Carroll Sage, PA-C  OXcarbazepine (TRILEPTAL) 150 MG tablet Take 150 mg by mouth 2 (two) times daily. 02/27/23   [provider]  pantoprazole (PROTONIX) 40 MG tablet Take 1 tablet (40 mg total) by mouth daily. 03/25/23   Westley Chandler, MD  tiZANidine (ZANAFLEX) 2 MG tablet Take 1 tablet (2 mg total) by mouth 2 (two) times daily as needed for muscle spasms (back pain). Patient not taking: Reported on 03/12/2023 03/04/23   Billey Co, MD  traZODone (DESYREL) 50 MG tablet Take 1 tablet (50 mg total) by mouth at bedtime as needed for sleep. Patient taking differently: Take 50 mg by mouth at bedtime. 12/06/22   Lewanda Rife, MD  hydrOXYzine (ATARAX) 10 MG tablet TAKE 1 TABLET BY MOUTH THREE TIMES A DAY AS NEEDED FOR  ANXIETY 10/15/22   Billey Co, MD    Allergies:   Allergies  Allergen Reactions   Capsaicin-Menthol Rash and Other (See Comments)    Burning and peeling on area applied to   Diclo Gel [Diclofenac Sodium] Rash and Other (See Comments)    Burning and peeling at the sight of application.    Social History:  reports that she has been smoking cigarettes. She has a 21.5 pack-year smoking history. She has been exposed to tobacco smoke. She has never used smokeless tobacco. She reports current alcohol use of about 3.0 standard drinks of alcohol per week. She reports current drug use. Drugs: Marijuana and Other-see comments.  Family History: Family History  Problem Relation Age of Onset   Hypertension Mother    Hyperlipidemia Mother    Aneurysm Mother        Rupture - Cause of death   Heart disease Father        MI - cause of death   Depression Father    Parkinsonism Father    Hypertension Sister    Colon cancer Neg Hx    Esophageal cancer Neg Hx    Rectal cancer Neg Hx    Stomach cancer Neg Hx     Physical Exam: Vitals:   04/06/23 0215 04/06/23 0230 04/06/23 0300 04/06/23 0400  BP: 130/71 134/88   137/83  Pulse: (!) 103 (!) 107  (!) 103  Resp: 20   20  Temp:   97.6 F (36.4 C)   TempSrc:   Oral   SpO2: 96% 95%  98%  Weight:      Height:        General: A and O x 3, well developed and nourished, no acute distress Eyes: Pink conjunctiva, no scleral icterus ENT: Moist oral mucosa, neck supple, no thyromegaly Lungs: CTA B/L, no wheeze, no crackles, no use of accessory muscles Cardiovascular: Tachycardia, RRR, no regurgitation, no gallops, no murmurs. No carotid bruits, no JVD Abdomen: soft, positive BS, NTND GU: not examined Neuro: CN II - XII grossly intact, Musculoskeletal: strength 5/5 all extremities, no clubbing, cyanosis or edema Skin: B/L LEs w/ scratches and surrounding erythema and warmth.  Inflammation Psych: appropriate patient   Labs on Admission:  Recent Labs    04/05/23 2330  NA 137  K 2.0*  CL 102  CO2 21*  GLUCOSE 135*  BUN <5*  CREATININE 0.54  CALCIUM 7.4*   Recent Labs    04/05/23 2330  AST 30  ALT 17  ALKPHOS 160*  BILITOT 0.5  PROT 6.2*  ALBUMIN 2.8*   No results for input(s): "LIPASE", "AMYLASE" in the last 72 hours. Recent Labs    04/05/23 2330  WBC 5.3  NEUTROABS 2.2  HGB 11.5*  HCT 36.5  MCV 92.4  PLT 253    Radiological Exams on Admission: DG Tibia/Fibula Left  Result Date: 04/06/2023 CLINICAL DATA:  144615 Pain 144615 EXAM: LEFT TIBIA AND FIBULA - 2 VIEW COMPARISON:  None Available. FINDINGS: There is no evidence of fracture or other focal bone lesions. Soft tissues are unremarkable. IMPRESSION: Negative. Electronically Signed   By: Tish Frederickson M.D.   On: 04/06/2023 01:45   DG Ankle Complete Left  Result Date: 04/06/2023 CLINICAL DATA:  leg swelling/pain, cellulitis EXAM: LEFT ANKLE COMPLETE - 3+ VIEW COMPARISON:  X-ray left ankle 08/07/2021 FINDINGS: There is no evidence of fracture, dislocation, or joint effusion. There is no evidence of arthropathy or other focal bone abnormality. Soft tissues are unremarkable.  IMPRESSION: No acute displaced fracture or dislocation. Electronically Signed   By: Tish Frederickson M.D.   On: 04/06/2023 01:35   DG Chest Portable 1 View  Result Date: 04/06/2023 CLINICAL DATA:  shortness of breath.  Swelling/ pain and cellulitis EXAM: PORTABLE CHEST 1 VIEW COMPARISON:  Chest x-ray 03/13/2023, CT chest 01/28/2023, chest x-ray 01/31/2023 FINDINGS: The heart and mediastinal contours are unchanged. Atherosclerotic plaque. No focal consolidation. No pulmonary edema. No pleural effusion. No pneumothorax. No acute osseous abnormality. IMPRESSION: 1. No active disease. 2.  Aortic Atherosclerosis (ICD10-I70.0). Electronically Signed   By: Tish Frederickson M.D.   On: 04/06/2023 01:31    Assessment/Plan Present on Admission:  Hypokalemia  Hypomagnesemia -No EKG changes -Total 4 g IV magnesium ordered.  P.o. potassium after magnesium repleted.  K-Dur 40 mg once p.o. x 3. -Electrolyte derangements likely secondary to patient's continued diarrhea.  Recommend outpatient magnesium supplementation.   B/L lower extremity cellulitis L.R -IV Rocephin ordered order set   Diarrhea, chronic -GI panel ordered. -C. difficile recently 9/29 negative -Imodium and Pepto-Bismol.  Probiotic ordered especially as patient on antibiotics.   Severe recurrent major depression without psychotic features //  Anxiety -Continue Lexapro, Klonopin, BuSpar   Paroxysmal atrial fibrillation (HCC) -Continue amiodarone and Eliquis   COPD (chronic obstructive pulmonary disease) (HCC) -Trelegy substitutions initiated   Tobacco use -Nicotine patch ordered  Shateria Paternostro 04/06/2023, 4:16 AM

## 2023-04-06 NOTE — ED Notes (Signed)
Assisted pt with changing her brief had diarrhea small and  green

## 2023-04-06 NOTE — Plan of Care (Signed)
  Problem: Education: Goal: Knowledge of General Education information will improve Description: Including pain rating scale, medication(s)/side effects and non-pharmacologic comfort measures Outcome: Progressing   Problem: Coping: Goal: Level of anxiety will decrease Outcome: Progressing   Problem: Pain Management: Goal: General experience of comfort will improve Outcome: Progressing   Problem: Safety: Goal: Ability to remain free from injury will improve Outcome: Progressing

## 2023-04-06 NOTE — ED Provider Notes (Signed)
[place] EMERGENCY DEPARTMENT AT [facility] Provider Note   CSN: [id number] Arrival date & time: 04/05/23  2257     History  Chief Complaint  Patient presents with   Leg Swelling    bilateral   Shortness of Breath   Emesis   Diarrhea    Karen Casey is a 67 y.o. female with past medical history significant for GERD, anxiety, depression, alcohol abuse, seizures, COPD, hypertension, chronic anticoagulation with Eliquis, paroxysmal A-fib presents to the ED complaining of swelling and pain to her left lower leg that has been worsening over the past week.  She also reports increased shortness of breath over the last week with wheezing, vomiting, and diarrhea.  Patient states her stomach chronically hurts and she has vomiting at baseline with GERD.        Home Medications Prior to Admission medications   Medication Sig Start Date End Date Taking? Authorizing Provider  albuterol (VENTOLIN HFA) 108 (90 Base) MCG/ACT inhaler Inhale 2 puffs into the lungs every 6 (six) hours as needed for wheezing or shortness of breath. 10/29/22   Billey Co, MD  amiodarone (PACERONE) 200 MG tablet Take 1 tablet (200 mg total) by mouth daily. 12/06/22   Lewanda Rife, MD  apixaban (ELIQUIS) 5 MG TABS tablet Take 1 tablet (5 mg total) by mouth 2 (two) times daily. 12/06/22   Lewanda Rife, MD  busPIRone (BUSPAR) 5 MG tablet Take 5 mg by mouth 2 (two) times daily. 01/04/23   [provider]  cholecalciferol (VITAMIN D3) 25 MCG (1000 UNIT) tablet Take 1,000 Units by mouth daily.    [provider]  clonazePAM (KLONOPIN) 0.5 MG tablet Take 1 tablet (0.5 mg total) by mouth 2 (two) times daily as needed for anxiety. Ok to fill 12/29/22 12/28/22   Billey Co, MD  denosumab (PROLIA) 60 MG/ML SOSY injection Inject 60 mg into the skin every 6 (six) months. 02/11/23   Billey Co, MD  escitalopram (LEXAPRO) 20 MG tablet Take 1 tablet (20 mg total) by mouth daily.  02/11/23   Billey Co, MD  famotidine (PEPCID) 20 MG tablet Take 1 tablet (20 mg total) by mouth 2 (two) times daily. 03/21/23   Westley Chandler, MD  ferrous sulfate 325 (65 FE) MG tablet Take 1 tablet (325 mg total) by mouth every other day. 12/06/22   Lewanda Rife, MD  Fluticasone-Umeclidin-Vilant (TRELEGY ELLIPTA) 100-62.5-25 MCG/ACT AEPB Inhale 1 puff into the lungs daily. 10/29/22   Billey Co, MD  gabapentin (NEURONTIN) 400 MG capsule Take 2 capsules (800 mg total) by mouth 3 (three) times daily. 03/21/23   Westley Chandler, MD  guaiFENesin (ROBITUSSIN) 100 MG/5ML liquid Take 10 mLs by mouth every 6 (six) hours as needed for cough or to loosen phlegm. Patient not taking: Reported on 03/12/2023 02/03/23   Dorcas Carrow, MD  lidocaine 4 % Place 1 patch onto the skin daily. Patient taking differently: Place 1 patch onto the skin daily as needed. 03/08/23   Billey Co, MD  loperamide (IMODIUM) 2 MG capsule Take 1 capsule (2 mg total) by mouth 4 (four) times daily as needed for diarrhea or loose stools. 03/13/23   Carroll Sage, PA-C  Multiple Vitamins-Minerals (CENTRUM ADULT PO) Take 1 tablet by mouth daily.    [provider]  nicotine (NICODERM CQ - DOSED IN MG/24 HOURS) 21 mg/24hr patch Place 1 patch (21 mg total) onto the skin daily. Patient not taking: Reported  on 03/12/2023 02/03/23   Dorcas Carrow, MD  ondansetron (ZOFRAN) 4 MG tablet Take 1 tablet (4 mg total) by mouth every 6 (six) hours. 03/13/23   Carroll Sage, PA-C  OXcarbazepine (TRILEPTAL) 150 MG tablet Take 150 mg by mouth 2 (two) times daily. 02/27/23   [provider]  pantoprazole (PROTONIX) 40 MG tablet Take 1 tablet (40 mg total) by mouth daily. 03/25/23   Westley Chandler, MD  tiZANidine (ZANAFLEX) 2 MG tablet Take 1 tablet (2 mg total) by mouth 2 (two) times daily as needed for muscle spasms (back pain). Patient not taking: Reported on 03/12/2023 03/04/23   Billey Co, MD   traZODone (DESYREL) 50 MG tablet Take 1 tablet (50 mg total) by mouth at bedtime as needed for sleep. Patient taking differently: Take 50 mg by mouth at bedtime. 12/06/22   Lewanda Rife, MD  hydrOXYzine (ATARAX) 10 MG tablet TAKE 1 TABLET BY MOUTH THREE TIMES A DAY AS NEEDED FOR ANXIETY 10/15/22   Billey Co, MD      Allergies    Capsaicin-menthol and Diclo gel [diclofenac sodium]    Review of Systems   Review of Systems  Respiratory:  Positive for shortness of breath.   Gastrointestinal:  Positive for diarrhea and vomiting.    Physical Exam Updated Vital Signs BP 137/83   Pulse (!) 103   Temp 97.6 F (36.4 C) (Oral)   Resp 20   Ht 5\' 4"  (1.626 m)   Wt 65.3 kg   SpO2 98%   BMI 24.72 kg/m  Physical Exam Vitals and nursing note reviewed.  Constitutional:      General: She is not in acute distress.    Appearance: Normal appearance. She is ill-appearing (chronically). She is not diaphoretic.  Cardiovascular:     Rate and Rhythm: Regular rhythm. Tachycardia present.     Pulses:          Dorsalis pedis pulses are 2+ on the right side and 2+ on the left side.  Pulmonary:     Effort: Pulmonary effort is normal. No tachypnea or respiratory distress.     Breath sounds: Normal breath sounds and air entry.     Comments: Cough appreciated during exam.  She is speaking in full sentences and is not dyspneic.  Musculoskeletal:     Right lower leg: 1+ Edema present.     Left lower leg: 2+ Pitting Edema present.  Skin:    General: Skin is warm and dry.     Capillary Refill: Capillary refill takes less than 2 seconds.     Findings: Erythema and wound present.     Comments: Erythema and multiple superficial wounds/stasis ulcers to bilateral lower extremities.  Left worse than right.  Left leg with increased warmth and tenderness over erythematous skin.    Neurological:     Mental Status: She is alert. Mental status is at baseline.  Psychiatric:        Mood and Affect: Mood is  anxious. Affect is tearful.        Speech: Speech is slurred.        Behavior: Behavior is agitated. Behavior is not aggressive or combative.     ED Results / Procedures / Treatments   Labs (all labs ordered are listed, but only abnormal results are displayed) Labs Reviewed  URINALYSIS, ROUTINE W REFLEX MICROSCOPIC - Abnormal; Notable for the following components:      Result Value   Color, Urine COLORLESS (*)  Specific Gravity, Urine 1.002 (*)    Leukocytes,Ua TRACE (*)    All other components within normal limits  COMPREHENSIVE METABOLIC PANEL - Abnormal; Notable for the following components:   Potassium 2.0 (*)    CO2 21 (*)    Glucose, Bld 135 (*)    BUN <5 (*)    Calcium 7.4 (*)    Total Protein 6.2 (*)    Albumin 2.8 (*)    Alkaline Phosphatase 160 (*)    All other components within normal limits  CBC WITH DIFFERENTIAL/PLATELET - Abnormal; Notable for the following components:   Hemoglobin 11.5 (*)    RDW 17.8 (*)    All other components within normal limits  ETHANOL - Abnormal; Notable for the following components:   Alcohol, Ethyl (B) 134 (*)    All other components within normal limits  BRAIN NATRIURETIC PEPTIDE - Abnormal; Notable for the following components:   B Natriuretic Peptide 150.0 (*)    All other components within normal limits  MAGNESIUM  TROPONIN I (HIGH SENSITIVITY)  TROPONIN I (HIGH SENSITIVITY)    EKG None  Radiology DG Tibia/Fibula Left  Result Date: 04/06/2023 CLINICAL DATA:  [id number] Pain 144615 EXAM: LEFT TIBIA AND FIBULA - 2 VIEW COMPARISON:  None Available. FINDINGS: There is no evidence of fracture or other focal bone lesions. Soft tissues are unremarkable. IMPRESSION: Negative. Electronically Signed   By: Tish Frederickson M.D.   On: 04/06/2023 01:45   DG Ankle Complete Left  Result Date: 04/06/2023 CLINICAL DATA:  leg swelling/pain, cellulitis EXAM: LEFT ANKLE COMPLETE - 3+ VIEW COMPARISON:  X-ray left ankle 08/07/2021 FINDINGS:  There is no evidence of fracture, dislocation, or joint effusion. There is no evidence of arthropathy or other focal bone abnormality. Soft tissues are unremarkable. IMPRESSION: No acute displaced fracture or dislocation. Electronically Signed   By: Tish Frederickson M.D.   On: 04/06/2023 01:35   DG Chest Portable 1 View  Result Date: 04/06/2023 CLINICAL DATA:  shortness of breath.  Swelling/ pain and cellulitis EXAM: PORTABLE CHEST 1 VIEW COMPARISON:  Chest x-ray 03/13/2023, CT chest 01/28/2023, chest x-ray 01/31/2023 FINDINGS: The heart and mediastinal contours are unchanged. Atherosclerotic plaque. No focal consolidation. No pulmonary edema. No pleural effusion. No pneumothorax. No acute osseous abnormality. IMPRESSION: 1. No active disease. 2.  Aortic Atherosclerosis (ICD10-I70.0). Electronically Signed   By: Tish Frederickson M.D.   On: 04/06/2023 01:31    Procedures Procedures    Medications Ordered in ED Medications  clonazePAM (KLONOPIN) tablet 0.5 mg (has no administration in time range)  gabapentin (NEURONTIN) capsule 800 mg (has no administration in time range)  bismuth subsalicylate (PEPTO BISMOL) chewable tablet 524 mg (has no administration in time range)  potassium chloride 10 mEq in 100 mL IVPB (0 mEq Intravenous Stopped 04/06/23 0256)  potassium chloride (KLOR-CON) packet 40 mEq (40 mEq Oral Given 04/06/23 0235)  HYDROcodone-acetaminophen (NORCO/VICODIN) 5-325 MG per tablet 1 tablet (1 tablet Oral Given 04/06/23 0323)  cefTRIAXone (ROCEPHIN) 1 g in sodium chloride 0.9 % 100 mL IVPB (1 g Intravenous New Bag/Given 04/06/23 0324)    ED Course/ Medical Decision Making/ A&P                                 Medical Decision Making Amount and/or Complexity of Data Reviewed Labs: ordered. Radiology: ordered.  Risk OTC drugs. Prescription drug management. Decision regarding hospitalization.   This patient presents to the ED  with chief complaint(s) of pain and swelling to left  lower leg, shortness of breath, worsening diarrhea with pertinent past medical history of alcohol abuse, GERD, COPD, long term use of anticoagulants, paroxysmal atrial fibrillation.  The complaint involves an extensive differential diagnosis and also carries with it a high risk of complications and morbidity.    The differential diagnosis includes cellulitis, venous stasis ulcers, new onset heart failure, pleural effusion, metabolic derangement   The initial plan is to obtain labs, chest x-ray, left ankle/leg x-ray  Additional history obtained: Records reviewed previous admission documents  Initial Assessment:   Exam significant for chronically ill-appearing patient who is not in acute distress.  Heart rate is mildly tachycardic around 104.  Lungs are clear to auscultation bilaterally.  Patient does have a cough during the exam.  Bilateral lower extremities with edema, left worse than right.  There are also multiple stasis ulcers and wounds.  Left leg with pitting edema, increased warmth and erythema around the wounds.  No active bleeding.  Patient's speech is mildly slurred and she does become easily agitated, but is easily redirected.  She is speaking in full sentences and does not appear dyspneic.   Independent ECG/labs interpretation:  The following labs were independently interpreted:  CBC with mild anemia.  Metabolic panel with a critical potassium of 2.  Alk phos is also elevated, but other LFTs are unremarkable.  Renal function is normal.  Ethanol elevated at 134.  BNP mildly elevated at 150.  UA without evidence of infection. Troponin negative.  Independent visualization and interpretation of imaging: I independently visualized the following imaging with scope of interpretation limited to determining acute life threatening conditions related to emergency care: chest x-ray, which revealed no infiltrate or pleural effusion.  Cardiac silhouette appears normal.  Left ankle and tib/fib x-ray  without bony lesions or findings consistent with osteomyelitis.  Treatment and Reassessment: Will give patient IV and PO potassium for hypokalemia.  Patient continues to complain of severe pain in her left lower extremity, will give hydrocodone.  Patient will also receive IV ceftriaxone to begin cellulitis treatment.  Patient continues to complain of burning in her feet and reports she has not had her home medication today.  Ordered her gabapentin and klonopin.  Patient appears anxious and tearful.  She states she has been dealing with a lot of grief and stress.  She does follow with outpatient psychiatry and psychology.  I feel that patient would benefit from hospital admission for hypokalemia.   Consultations obtained:   I spoke with on-call hospitalist Dr. Joneen Roach who agrees with hospital admission.  Disposition:   Patient to be admitted to [facility] for hypokalemia and cellulitis.  Social Determinants of Health:   Patient's  alcohol abuse  increases the complexity of managing their presentation         Final Clinical Impression(s) / ED Diagnoses Final diagnoses:  Hypokalemia  Cellulitis of left lower extremity    Rx / DC Orders ED Discharge Orders     None         Lenard Simmer, PA-C 04/06/23 0418    Shon Baton, MD 04/06/23 [phone]

## 2023-04-06 NOTE — ED Notes (Signed)
ED TO INPATIENT HANDOFF REPORT  ED Nurse Name and Phone #: [place] P[place] Name/Age/Gender Karen Casey 67 y.o. female Room/Bed: WA15/WA15  Code Status   Code Status: Full Code  Home/SNF/Other Home Patient oriented to: self, place, time, and situation Is this baseline? Yes   Triage Complete: Triage complete  Chief Complaint Cellulitis [L03.90]  Triage Note Per EMS  Edema Lower legs Worsening over the week SOB Ongoing for weeks Wheezing Vomiting Baseline with GERD Diarrhea Over a week  1 Duoneb 1 albuterol neb   97% oxygen on room air CBG 116 BP 114/68 Hr 90's  Walks with a walker baseline   Allergies Allergies  Allergen Reactions   Capsaicin-Menthol Rash and Other (See Comments)    Burning and peeling on area applied to   Diclo Gel [Diclofenac Sodium] Rash and Other (See Comments)    Burning and peeling at the sight of application.    Level of Care/Admitting Diagnosis ED Disposition     ED Disposition  Admit   Condition  --   Comment  Hospital Area: [facility] [[id number]]  Level of Care: Med-Surg [16]  May admit patient to [facility] or [facility] if equivalent level of care is available:: Yes  Covid Evaluation: Asymptomatic - no recent exposure (last 10 days) testing not required  Diagnosis: Cellulitis [161096]  Admitting Physician: Gery Pray [[id number]]  Attending Physician: Gery Pray [[id number]]  Certification:: I certify this patient will need inpatient services for at least 2 midnights  Expected Medical Readiness: 04/08/2023          B Medical/Surgery History Past Medical History:  Diagnosis Date   Alcohol abuse    last use 03/09/21, marijuana last 03/09/21   Allergy    Anxiety    Cataract 06/09/2012   Right eye and left eye   COPD (chronic obstructive pulmonary disease) (HCC)    Depression    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/04/2020   Due to benzodiazepine withdrawal   Dyspnea    uses  oxygen 2L via [place] prn   Enterococcal bacteremia 02/10/2022   GERD (gastroesophageal reflux disease)    Headache    Hypertension    Long term (current) use of anticoagulants    Nausea vomiting and diarrhea 02/09/2022   Neuromuscular disorder (HCC)    Neuropathy    Paroxysmal atrial fibrillation (HCC)    Positive blood culture 02/10/2022   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   Seizure (HCC)    08/2020 per patient   Seizures (HCC)    xanax withdrawl- December 2013   Urinary incontinence 06/09/2012   Past Surgical History:  Procedure Laterality Date   APPENDECTOMY     BIOPSY  01/04/2021   Procedure: BIOPSY;  Surgeon: Benancio Deeds, MD;  Location: [facility] ENDOSCOPY;  Service: Gastroenterology;;   BIOPSY  03/12/2021   Procedure: BIOPSY;  Surgeon: Lemar Lofty., MD;  Location: [facility] ENDOSCOPY;  Service: Gastroenterology;;   CARDIOVERSION N/A 01/28/2022   Procedure: CARDIOVERSION;  Surgeon: Yates Decamp, MD;  Location: [facility] ENDOSCOPY;  Service: Cardiovascular;  Laterality: N/A;   CATARACT EXTRACTION  06/09/2012   Left eye   COLONOSCOPY WITH PROPOFOL N/A 03/12/2021   Procedure: COLONOSCOPY WITH PROPOFOL;  Surgeon: Lemar Lofty., MD;  Location: [facility] ENDOSCOPY;  Service: Gastroenterology;  Laterality: N/A;   ESOPHAGOGASTRODUODENOSCOPY (EGD) WITH PROPOFOL N/A 01/04/2021   Procedure: ESOPHAGOGASTRODUODENOSCOPY (EGD) WITH PROPOFOL;  Surgeon: Benancio Deeds, MD;  Location: [facility] ENDOSCOPY;  Service:  Gastroenterology;  Laterality: N/A;   left shoulder dislocation  Sept 2011   POLYPECTOMY  03/12/2021   Procedure: POLYPECTOMY;  Surgeon: Mansouraty, Netty Starring., MD;  Location: [facility] ENDOSCOPY;  Service: Gastroenterology;;     A IV Location/Drains/Wounds Patient Lines/Drains/Airways Status     Active Line/Drains/Airways     Name Placement date Placement time Site Days   Peripheral IV 04/05/23 20 G Right Antecubital 04/05/23  --  Antecubital  1   Wound / Incision  (Open or Dehisced) 11/28/21 Irritant Dermatitis (Moisture Associated Skin Damage) Groin Bilateral 11/28/21  2000  Groin  494   Wound / Incision (Open or Dehisced) 01/02/22 Irritant Dermatitis (Moisture Associated Skin Damage) Perineum Anterior;Posterior;Medial redness extending on perineum and buttocks 01/02/22  1400  Perineum  459            Intake/Output Last 24 hours  Intake/Output Summary (Last 24 hours) at 04/06/2023 1610 Last data filed at 04/06/2023 0630 Gross per 24 hour  Intake 450 ml  Output --  Net 450 ml    Labs/Imaging Results for orders placed or performed during the hospital encounter of 04/05/23 (from the past 48 hour(s))  Urinalysis, Routine w reflex microscopic -Urine, Clean Catch     Status: Abnormal   Collection Time: 04/05/23 11:30 PM  Result Value Ref Range   Color, Urine COLORLESS (A) YELLOW   APPearance CLEAR CLEAR   Specific Gravity, Urine 1.002 (L) 1.005 - 1.030   pH 7.0 5.0 - 8.0   Glucose, UA NEGATIVE NEGATIVE mg/dL   Hgb urine dipstick NEGATIVE NEGATIVE   Bilirubin Urine NEGATIVE NEGATIVE   Ketones, ur NEGATIVE NEGATIVE mg/dL   Protein, ur NEGATIVE NEGATIVE mg/dL   Nitrite NEGATIVE NEGATIVE   Leukocytes,Ua TRACE (A) NEGATIVE   RBC / HPF 0-5 0 - 5 RBC/hpf   WBC, UA 0-5 0 - 5 WBC/hpf   Bacteria, UA NONE SEEN NONE SEEN   Squamous Epithelial / HPF 0-5 0 - 5 /HPF    Comment: Performed at [facility], [address] [place], [place] [address]  Comprehensive metabolic panel     Status: Abnormal   Collection Time: 04/05/23 11:30 PM  Result Value Ref Range   Sodium 137 135 - 145 mmol/L   Potassium 2.0 (LL) 3.5 - 5.1 mmol/L    Comment: CRITICAL RESULT CALLED TO, READ BACK BY AND VERIFIED WITH BLACK, M. RN AT 0023 ON 04/06/23. FA    Chloride 102 98 - 111 mmol/L   CO2 21 (L) 22 - 32 mmol/L   Glucose, Bld 135 (H) 70 - 99 mg/dL    Comment: Glucose reference range applies only to samples taken after fasting for at least 8 hours.    BUN <5 (L) 8 - 23 mg/dL   Creatinine, Ser 4.09 0.44 - 1.00 mg/dL   Calcium 7.4 (L) 8.9 - 10.3 mg/dL   Total Protein 6.2 (L) 6.5 - 8.1 g/dL   Albumin 2.8 (L) 3.5 - 5.0 g/dL   AST 30 15 - 41 U/L   ALT 17 0 - 44 U/L   Alkaline Phosphatase 160 (H) 38 - 126 U/L   Total Bilirubin 0.5 0.3 - 1.2 mg/dL   GFR, Estimated >81 >19 mL/min    Comment: (NOTE) Calculated using the CKD-EPI Creatinine Equation (2021)    Anion gap 14 5 - 15    Comment: Performed at [facility], [address], [place], [place] [address]  CBC with Differential     Status: Abnormal  Collection Time: 04/05/23 11:30 PM  Result Value Ref Range   WBC 5.3 4.0 - 10.5 K/uL   RBC 3.95 3.87 - 5.11 MIL/uL   Hemoglobin 11.5 (L) 12.0 - 15.0 g/dL   HCT 19.1 47.8 - 29.5 %   MCV 92.4 80.0 - 100.0 fL   MCH 29.1 26.0 - 34.0 pg   MCHC 31.5 30.0 - 36.0 g/dL   RDW 62.1 (H) 30.8 - 65.7 %   Platelets 253 150 - 400 K/uL   nRBC 0.0 0.0 - 0.2 %   Neutrophils Relative % 41 %   Neutro Abs 2.2 1.7 - 7.7 K/uL   Lymphocytes Relative 43 %   Lymphs Abs 2.3 0.7 - 4.0 K/uL   Monocytes Relative 11 %   Monocytes Absolute 0.6 0.1 - 1.0 K/uL   Eosinophils Relative 3 %   Eosinophils Absolute 0.2 0.0 - 0.5 K/uL   Basophils Relative 1 %   Basophils Absolute 0.0 0.0 - 0.1 K/uL   Immature Granulocytes 1 %   Abs Immature Granulocytes 0.03 0.00 - 0.07 K/uL    Comment: Performed at [facility], [address], [place], [place] [address]  Ethanol     Status: Abnormal   Collection Time: 04/05/23 11:30 PM  Result Value Ref Range   Alcohol, Ethyl (B) 134 (H) <10 mg/dL    Comment: (NOTE) Lowest detectable limit for serum alcohol is 10 mg/dL.  For medical purposes only. Performed at [facility], [address], [place], [place] [address]   Troponin I (High Sensitivity)     Status: None   Collection Time: 04/05/23 11:30 PM  Result Value Ref Range   Troponin I (High Sensitivity) 8 <18 ng/L     Comment: (NOTE) Elevated high sensitivity troponin I (hsTnI) values and significant  changes across serial measurements may suggest ACS but many other  chronic and acute conditions are known to elevate hsTnI results.  Refer to the "Links" section for chest pain algorithms and additional  guidance. Performed at [facility], [address] [place], [place] [address]   Brain natriuretic peptide     Status: Abnormal   Collection Time: 04/05/23 11:30 PM  Result Value Ref Range   B Natriuretic Peptide 150.0 (H) 0.0 - 100.0 pg/mL    Comment: Performed at [facility], [address], [place], [place] [address]  Troponin I (High Sensitivity)     Status: None   Collection Time: 04/06/23 12:59 AM  Result Value Ref Range   Troponin I (High Sensitivity) 8 <18 ng/L    Comment: (NOTE) Elevated high sensitivity troponin I (hsTnI) values and significant  changes across serial measurements may suggest ACS but many other  chronic and acute conditions are known to elevate hsTnI results.  Refer to the "Links" section for chest pain algorithms and additional  guidance. Performed at [facility], [address] [place], [place] [address]   Magnesium     Status: Abnormal   Collection Time: 04/06/23 12:59 AM  Result Value Ref Range   Magnesium 1.0 (L) 1.7 - 2.4 mg/dL    Comment: Performed at [facility], [address] [place], [place] [address]  Basic metabolic panel     Status: Abnormal   Collection Time: 04/06/23  5:21 AM  Result Value Ref Range   Sodium 137 135 - 145 mmol/L   Potassium 2.9 (L) 3.5 - 5.1 mmol/L   Chloride 102 98 - 111 mmol/L  CO2 23 22 - 32 mmol/L   Glucose, Bld 208 (H) 70 - 99 mg/dL    Comment: Glucose reference range applies only to samples taken after fasting for at least 8 hours.   BUN <5 (L) 8 - 23 mg/dL   Creatinine, Ser 4.69 0.44 - 1.00 mg/dL   Calcium 7.2 (L) 8.9 - 10.3 mg/dL   GFR,  Estimated >62 >95 mL/min    Comment: (NOTE) Calculated using the CKD-EPI Creatinine Equation (2021)    Anion gap 12 5 - 15    Comment: Performed at [facility], [address], [place], [place] [address]  Magnesium     Status: Abnormal   Collection Time: 04/06/23  5:21 AM  Result Value Ref Range   Magnesium 0.9 (LL) 1.7 - 2.4 mg/dL    Comment: CRITICAL RESULT CALLED TO, READ BACK BY AND VERIFIED WITH PATT, G. RM AT 2440 ON 04/06/23. FA Performed at [facility], [address], [place], [place] [address]   CBC with Differential/Platelet     Status: Abnormal   Collection Time: 04/06/23  5:21 AM  Result Value Ref Range   WBC 3.7 (L) 4.0 - 10.5 K/uL   RBC 3.41 (L) 3.87 - 5.11 MIL/uL   Hemoglobin 9.9 (L) 12.0 - 15.0 g/dL   HCT 53.6 (L) 64.4 - 03.4 %   MCV 90.3 80.0 - 100.0 fL   MCH 29.0 26.0 - 34.0 pg   MCHC 32.1 30.0 - 36.0 g/dL   RDW 74.2 (H) 59.5 - 63.8 %   Platelets 189 150 - 400 K/uL   nRBC 0.0 0.0 - 0.2 %   Neutrophils Relative % 90 %   Neutro Abs 3.3 1.7 - 7.7 K/uL   Lymphocytes Relative 8 %   Lymphs Abs 0.3 (L) 0.7 - 4.0 K/uL   Monocytes Relative 1 %   Monocytes Absolute 0.1 0.1 - 1.0 K/uL   Eosinophils Relative 0 %   Eosinophils Absolute 0.0 0.0 - 0.5 K/uL   Basophils Relative 0 %   Basophils Absolute 0.0 0.0 - 0.1 K/uL   Immature Granulocytes 1 %   Abs Immature Granulocytes 0.02 0.00 - 0.07 K/uL    Comment: Performed at [facility], [address], [place], [place] [address]   DG Tibia/Fibula Left  Result Date: 04/06/2023 CLINICAL DATA:  144615 Pain 144615 EXAM: LEFT TIBIA AND FIBULA - 2 VIEW COMPARISON:  None Available. FINDINGS: There is no evidence of fracture or other focal bone lesions. Soft tissues are unremarkable. IMPRESSION: Negative. Electronically Signed   By: Tish Frederickson M.D.   On: 04/06/2023 01:45   DG Ankle Complete Left  Result Date: 04/06/2023 CLINICAL DATA:  leg swelling/pain,  cellulitis EXAM: LEFT ANKLE COMPLETE - 3+ VIEW COMPARISON:  X-ray left ankle 08/07/2021 FINDINGS: There is no evidence of fracture, dislocation, or joint effusion. There is no evidence of arthropathy or other focal bone abnormality. Soft tissues are unremarkable. IMPRESSION: No acute displaced fracture or dislocation. Electronically Signed   By: Tish Frederickson M.D.   On: 04/06/2023 01:35   DG Chest Portable 1 View  Result Date: 04/06/2023 CLINICAL DATA:  shortness of breath.  Swelling/ pain and cellulitis EXAM: PORTABLE CHEST 1 VIEW COMPARISON:  Chest x-ray 03/13/2023, CT chest 01/28/2023, chest x-ray 01/31/2023 FINDINGS: The heart and mediastinal contours are unchanged. Atherosclerotic plaque. No focal consolidation. No pulmonary edema. No pleural effusion. No pneumothorax. No acute osseous abnormality. IMPRESSION: 1. No active disease. 2.  Aortic Atherosclerosis (  ICD10-I70.0). Electronically Signed   By: Tish Frederickson M.D.   On: 04/06/2023 01:31    Pending Labs Unresulted Labs (From admission, onward)     Start     Ordered   04/06/23 0615  Gastrointestinal Panel by PCR , Stool  (Gastrointestinal Panel by PCR, Stool                                                                                                                                                     **Does Not include CLOSTRIDIUM DIFFICILE testing. **If CDIFF testing is needed, place order from the "C Difficile Testing" order set.**)  Once,   R        04/06/23 0614            Vitals/Pain Today's Vitals   04/06/23 0230 04/06/23 0300 04/06/23 0400 04/06/23 0424  BP: 134/88  137/83   Pulse: (!) 107  (!) 103   Resp:   20   Temp:  97.6 F (36.4 C)    TempSrc:  Oral    SpO2: 95%  98%   Weight:      Height:      PainSc:    8     Isolation Precautions Enteric precautions (UV disinfection)  Medications Medications  cefTRIAXone (ROCEPHIN) 1 g in sodium chloride 0.9 % 100 mL IVPB (has no administration in time range)   acetaminophen (TYLENOL) tablet 650 mg (has no administration in time range)    Or  acetaminophen (TYLENOL) suppository 650 mg (has no administration in time range)  senna-docusate (Senokot-S) tablet 1 tablet (has no administration in time range)  loperamide (IMODIUM) capsule 4 mg (4 mg Oral Given 04/06/23 4034)    Followed by  loperamide (IMODIUM) capsule 2 mg (has no administration in time range)  lactobacillus acidophilus (BACID) tablet 2 tablet (has no administration in time range)  amiodarone (PACERONE) tablet 200 mg (has no administration in time range)  apixaban (ELIQUIS) tablet 5 mg (has no administration in time range)  escitalopram (LEXAPRO) tablet 20 mg (has no administration in time range)  busPIRone (BUSPAR) tablet 5 mg (has no administration in time range)  clonazePAM (KLONOPIN) tablet 0.5 mg (has no administration in time range)  ferrous sulfate tablet 325 mg (has no administration in time range)  fluticasone furoate-vilanterol (BREO ELLIPTA) 100-25 MCG/ACT 1 puff (has no administration in time range)    And  umeclidinium bromide (INCRUSE ELLIPTA) 62.5 MCG/ACT 1 puff (has no administration in time range)  gabapentin (NEURONTIN) capsule 800 mg (has no administration in time range)  potassium chloride SA (KLOR-CON M) CR tablet 40 mEq (40 mEq Oral Given 04/06/23 0633)  nicotine (NICODERM CQ - dosed in mg/24 hours) patch 14 mg (has no administration in time range)  bismuth subsalicylate (PEPTO BISMOL) 262 MG/15ML suspension 30 mL (has no administration in time range)  magnesium sulfate IVPB 2 g 50 mL (2 g Intravenous New Bag/Given 04/06/23 1610)  potassium chloride 10 mEq in 100 mL IVPB (0 mEq Intravenous Stopped 04/06/23 0256)  potassium chloride (KLOR-CON) packet 40 mEq (40 mEq Oral Given 04/06/23 0235)  HYDROcodone-acetaminophen (NORCO/VICODIN) 5-325 MG per tablet 1 tablet (1 tablet Oral Given 04/06/23 0323)  cefTRIAXone (ROCEPHIN) 1 g in sodium chloride 0.9 % 100 mL IVPB (0 g  Intravenous Stopped 04/06/23 0424)  clonazePAM (KLONOPIN) tablet 0.5 mg (0.5 mg Oral Given 04/06/23 0422)  gabapentin (NEURONTIN) capsule 800 mg (800 mg Oral Given 04/06/23 0422)  bismuth subsalicylate (PEPTO BISMOL) chewable tablet 524 mg (524 mg Oral Given 04/06/23 0422)  magnesium sulfate IVPB 2 g 50 mL (0 g Intravenous Stopped 04/06/23 0630)    Mobility walks with device     Focused Assessments Cardiac Assessment Handoff:  Cardiac Rhythm: Sinus tachycardia Lab Results  Component Value Date   CKTOTAL 47 11/20/2021   Lab Results  Component Value Date   DDIMER  05/14/2007    <0.22        AT THE INHOUSE ESTABLISHED CUTOFF VALUE OF 0.48 ug/mL FEU, THIS ASSAY HAS BEEN DOCUMENTED IN THE LITERATURE TO HAVE   Does the Patient currently have chest pain? No    R Recommendations: See Admitting Provider Note  Report given to:   Additional Notes:

## 2023-04-06 NOTE — Progress Notes (Signed)
Mobility Specialist - Progress Note   04/06/23 1409  Mobility  Activity Ambulated with assistance in hallway  Level of Assistance Standby assist, set-up cues, supervision of patient - no hands on  Assistive Device Front wheel walker  Distance Ambulated (ft) 180 ft  Range of Motion/Exercises Active  Activity Response Tolerated well  Mobility Referral Yes  $Mobility charge 1 Mobility  Mobility Specialist Start Time (ACUTE ONLY) 1350  Mobility Specialist Stop Time (ACUTE ONLY) 1407  Mobility Specialist Time Calculation (min) (ACUTE ONLY) 17 min   Received in bed and agreed to mobility, no c/o pain nor discomfort. Pt nearing EOS needed to rush to restroom for BM. Successful BM and returned to bed with all needs met.  Marilynne Halsted Mobility Specialist

## 2023-04-07 DIAGNOSIS — E876 Hypokalemia: Secondary | ICD-10-CM | POA: Diagnosis not present

## 2023-04-07 DIAGNOSIS — L03116 Cellulitis of left lower limb: Secondary | ICD-10-CM | POA: Diagnosis not present

## 2023-04-07 LAB — GASTROINTESTINAL PANEL BY PCR, STOOL (REPLACES STOOL CULTURE)

## 2023-04-07 LAB — RENAL FUNCTION PANEL
Albumin: 2.7 g/dL — ABNORMAL LOW (ref 3.5–5.0)
Anion gap: 7 (ref 5–15)
BUN: 6 mg/dL — ABNORMAL LOW (ref 8–23)
CO2: 24 mmol/L (ref 22–32)
Calcium: 7.9 mg/dL — ABNORMAL LOW (ref 8.9–10.3)
Chloride: 106 mmol/L (ref 98–111)
Creatinine, Ser: 0.58 mg/dL (ref 0.44–1.00)
GFR, Estimated: >60 mL/min (ref >60)
Glucose, Bld: 110 mg/dL — ABNORMAL HIGH (ref 70–99)
Phosphorus: 1.3 mg/dL — ABNORMAL LOW (ref 2.5–4.6)
Potassium: 5.2 mmol/L — ABNORMAL HIGH (ref 3.5–5.1)
Sodium: 137 mmol/L (ref 135–145)

## 2023-04-07 LAB — MAGNESIUM: Magnesium: 2 mg/dL (ref 1.7–2.4)

## 2023-04-07 MED ORDER — THIAMINE MONONITRATE 100 MG PO TABS
100.0000 mg | ORAL_TABLET | Freq: Every day | ORAL | Status: DC
  Administered 2023-04-07 – 2023-04-09 (×3): 100 mg via ORAL
  Filled 2023-04-07 (×3): qty 1

## 2023-04-07 MED ORDER — DOXYCYCLINE HYCLATE 100 MG PO TABS
100.0000 mg | ORAL_TABLET | Freq: Two times a day (BID) | ORAL | Status: DC
Start: 2023-04-07 — End: 2023-04-09
  Administered 2023-04-07 – 2023-04-09 (×4): 100 mg via ORAL
  Filled 2023-04-07 (×4): qty 1

## 2023-04-07 MED ORDER — SILVER SULFADIAZINE 1 % EX CREA
TOPICAL_CREAM | Freq: Every day | CUTANEOUS | Status: DC
Start: 2023-04-07 — End: 2023-04-09
  Filled 2023-04-07: qty 50

## 2023-04-07 MED ORDER — FOLIC ACID 1 MG PO TABS
1.0000 mg | ORAL_TABLET | Freq: Every day | ORAL | Status: DC
  Administered 2023-04-07 – 2023-04-09 (×3): 1 mg via ORAL
  Filled 2023-04-07 (×3): qty 1

## 2023-04-07 MED ORDER — K PHOS MONO-SOD PHOS DI & MONO 155-852-130 MG PO TABS
500.0000 mg | ORAL_TABLET | Freq: Two times a day (BID) | ORAL | Status: AC
  Administered 2023-04-07 (×2): 500 mg via ORAL
  Filled 2023-04-07 (×2): qty 2

## 2023-04-07 MED ORDER — CYANOCOBALAMIN 1000 MCG/ML IJ SOLN
1000.0000 ug | Freq: Every day | INTRAMUSCULAR | Status: DC
  Administered 2023-04-07 – 2023-04-09 (×3): 1000 ug via SUBCUTANEOUS
  Filled 2023-04-07 (×3): qty 1

## 2023-04-07 MED ORDER — SODIUM CHLORIDE 0.9 % IV SOLN: INTRAVENOUS | Status: DC

## 2023-04-07 NOTE — Plan of Care (Signed)
  Problem: Coping: Goal: Level of anxiety will decrease Outcome: Progressing   Problem: Pain Management: Goal: General experience of comfort will improve Outcome: Progressing   Problem: Safety: Goal: Ability to remain free from injury will improve Outcome: Progressing

## 2023-04-07 NOTE — Consult Note (Signed)
WOC Nurse Consult Note: Reason for Consult: Cellulitis to bilateral lower legs.  Resolved edema, erythema has improved.  On doxycycline   Wound type: infectious  has been applying neosporin at home.  Pressure Injury POA: NA Measurement: scattered 1 cm scabbed abrasions to lower extremities.  States she does pick at them sometimes.  Edema greatly improved.   Wound bed: scabbed Drainage (amount, consistency, odor) scant weeping  no odor Periwound: faint erythema, improved edema Dressing procedure/placement/frequency: Cleanse bilateral lower legs with soap and water and pat dry. Apply silvadene cream.  COver with dry gauze and kerlix/tape. Change daily.  Will not follow at this time.  Please re-consult if needed.  Mike Gip MSN, RN, FNP-BC CWON Wound, Ostomy, Continence Nurse Outpatient [facility] [place] [phone] Pager [phone]

## 2023-04-07 NOTE — Progress Notes (Signed)
[facility] PROGRESS NOTE    Progress Note  Karen Casey  ZDG:[id number] DOB: February 11, 1956 DOA: 04/05/2023 PCP: Billey Co, MD     Brief Narrative:   Karen Casey is an 67 y.o. 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  C. difficile PCR on the ED visit on 03/13/2023 was negative, also complaining of red patches on her lower extremity painful   Assessment/Plan:   Electrolyte imbalance hypokalemia/hypomagnesemia: Replete orally potassium is 5.2 this morning magnesium is 2. The phosphorus is 1.3 will allow a diet recheck tomorrow morning.  Bilateral lower extremity Cellulitis She has remained afebrile with no leukocytosis. Erythema and swelling are improved transition to oral doxycycline.  Chronic diarrhea: C. difficile PCR - 03/04/2023, GI panel has been ordered.  Normocytic anemia Repeat folate orally, B12 subcutaneously.  He was also started on thiamine due to her history of alcohol abuse. Ferritin of 24, will start on ferrous sulfate. Vitamin B12 247, replete   Tobacco use Counseling.  Paroxysmal atrial fibrillation (HCC) Continue amiodarone and Eliquis. Not on any rate controlling medication.  Elevated blood pressure without a diagnosis of hypertension: Blood pressure is well-controlled today.  Depression with anxiety Continue current regimen.  Peripheral neuropathy: Continue Neurontin.  History of alcohol abuse: Started on thiamine and folate. She will she does not drink every day she has no signs of withdrawal.    DVT prophylaxis: Eliquis Family Communication:none Status is: Inpatient Remains inpatient appropriate because: Electrolyte imbalance hypokalemia/hypomagnesemia    Code Status:     Code Status Orders  (From  admission, onward)           Start     Ordered   04/06/23 0457  Full code  Continuous       Question:  By:  Answer:  Consent: discussion documented in EHR   04/06/23 0457           Code Status History     Date Active Date Inactive Code Status Order ID Comments User Context   01/31/2023 2204 02/03/2023 1656 Full Code [id number]  Charlsie Quest, MD ED   12/06/2022 1409 12/06/2022 1942 Full Code [id number]  Nelly Rout, MD Inpatient   11/27/2022 2056 11/28/2022 1659 Full Code [id number]  Maricela Bo, NP ED   07/27/2022 1512 07/30/2022 1632 Full Code [id number]  Bobette Mo, MD ED   07/26/2022 0518 07/27/2022 1512 Full Code [id number]  Garlon Hatchet, PA-C ED   02/09/2022 0002 02/09/2022 2304 Full Code [id number]  Glendale Chard, DO ED   01/25/2022 1733 01/30/2022 0149 Full Code [id number]  Alfredo Martinez, MD ED   01/02/2022 1350 01/07/2022 2320 Full Code [id number]  Teddy Spike, DO ED   11/21/2021 0034 12/01/2021 2222 Full Code [id number]  Carollee Herter, DO ED   11/20/2021 2353 11/21/2021 0034 Full Code [id number]  Carollee Herter, DO ED   07/27/2021 1630 07/29/2021 2003 Full Code [id number]  Alfredo Martinez, MD ED   06/21/2021 0420 06/24/2021 1630 Full Code [id number]  Evelena Leyden, DO ED   06/21/2021 0330 06/21/2021 0420 Full Code [id number]  Dione Booze, MD ED   01/02/2021 2136 01/05/2021 2205 Full Code [id number]  Towanda Octave, MD ED   10/12/2020 2105 10/17/2020 2217 Full Code [id number]  Gertha Calkin, MD ED   09/25/2020 1749 09/28/2020 1913 Full  Code [id number]  Littie Deeds, MD ED   09/10/2020 0011 09/22/2020 2234 Full Code [id number]  Tim Lair, PA-C ED   09/09/2020 2129 09/10/2020 0011 Full Code [id number]  Pollyann Savoy, MD ED   09/27/2017 0215 09/29/2017 1547 Full Code [id number]  Tonye Royalty, DO Inpatient   08/03/2012 1943 08/04/2012 0545 Full Code [id number]  Remi Haggard, NP ED   07/11/2012 0006 07/11/2012 1553 Full Code [id number]  Anne Shutter, Herbert Seta, PA-C ED   05/25/2012 2044 05/26/2012 0913 Full  Code [id number]  Arthor Captain, PA-C ED         IV Access:   Peripheral IV   Procedures and diagnostic studies:   DG Tibia/Fibula Left  Result Date: 04/06/2023 CLINICAL DATA:  144615 Pain 144615 EXAM: LEFT TIBIA AND FIBULA - 2 VIEW COMPARISON:  None Available. FINDINGS: There is no evidence of fracture or other focal bone lesions. Soft tissues are unremarkable. IMPRESSION: Negative. Electronically Signed   By: Tish Frederickson M.D.   On: 04/06/2023 01:45   DG Ankle Complete Left  Result Date: 04/06/2023 CLINICAL DATA:  leg swelling/pain, cellulitis EXAM: LEFT ANKLE COMPLETE - 3+ VIEW COMPARISON:  X-ray left ankle 08/07/2021 FINDINGS: There is no evidence of fracture, dislocation, or joint effusion. There is no evidence of arthropathy or other focal bone abnormality. Soft tissues are unremarkable. IMPRESSION: No acute displaced fracture or dislocation. Electronically Signed   By: Tish Frederickson M.D.   On: 04/06/2023 01:35   DG Chest Portable 1 View  Result Date: 04/06/2023 CLINICAL DATA:  shortness of breath.  Swelling/ pain and cellulitis EXAM: PORTABLE CHEST 1 VIEW COMPARISON:  Chest x-ray 03/13/2023, CT chest 01/28/2023, chest x-ray 01/31/2023 FINDINGS: The heart and mediastinal contours are unchanged. Atherosclerotic plaque. No focal consolidation. No pulmonary edema. No pleural effusion. No pneumothorax. No acute osseous abnormality. IMPRESSION: 1. No active disease. 2.  Aortic Atherosclerosis (ICD10-I70.0). Electronically Signed   By: Tish Frederickson M.D.   On: 04/06/2023 01:31     Medical Consultants:   None.   Subjective:    Karen Casey relates her swelling and erythema has improved.  Objective:    Vitals:   04/06/23 1725 04/06/23 2108 04/07/23 0615 04/07/23 0723  BP: 131/73 116/73 112/64   Pulse: 87 73 68   Resp: 17 18 16    Temp: 97.8 F (36.6 C) 98.4 F (36.9 C) 98.6 F (37 C)   TempSrc:  Oral Oral   SpO2: 98% 95% 96% 98%  Weight:      Height:        SpO2: 98 %   Intake/Output Summary (Last 24 hours) at 04/07/2023 0859 Last data filed at 04/06/2023 2114 Gross per 24 hour  Intake 1689.82 ml  Output --  Net 1689.82 ml   Filed Weights   04/05/23 2310  Weight: 65.3 kg    Exam: General exam: In no acute distress. Respiratory system: Good air movement and clear to auscultation. Cardiovascular system: S1 & S2 heard, RRR. No JVD. Gastrointestinal system: Abdomen is nondistended, soft and nontender.  Extremities: No pedal edema. Skin: No rashes, lesions or ulcers Psychiatry: Judgement and insight appear normal. Mood & affect appropriate.  Data Reviewed:    Labs: Basic Metabolic Panel: Recent Labs  Lab 04/05/23 2330 04/06/23 0059 04/06/23 0521 04/07/23 0357  NA 137  --  137 137  K 2.0*  --  2.9* 5.2*  CL 102  --  102 106  CO2 21*  --  23 24  GLUCOSE 135*  --  208* 110*  BUN <5*  --  <5* 6*  CREATININE 0.54  --  0.66 0.58  CALCIUM 7.4*  --  7.2* 7.9*  MG  --  1.0* 0.9* 2.0  PHOS  --   --   --  1.3*   GFR Estimated Creatinine Clearance: 61.3 mL/min (by C-G formula based on SCr of 0.58 mg/dL). Liver Function Tests: Recent Labs  Lab 04/05/23 2330 04/07/23 0357  AST 30  --   ALT 17  --   ALKPHOS 160*  --   BILITOT 0.5  --   PROT 6.2*  --   ALBUMIN 2.8* 2.7*   No results for input(s): "LIPASE", "AMYLASE" in the last 168 hours. No results for input(s): "AMMONIA" in the last 168 hours. Coagulation profile No results for input(s): "INR", "PROTIME" in the last 168 hours. COVID-19 Labs  Recent Labs    04/06/23 0859  FERRITIN 24    Lab Results  Component Value Date   SARSCOV2NAA NEGATIVE 01/31/2023   SARSCOV2NAA NEGATIVE 07/26/2022   SARSCOV2NAA NEGATIVE 02/08/2022   SARSCOV2NAA NEGATIVE 07/28/2021    CBC: Recent Labs  Lab 04/05/23 2330 04/06/23 0521  WBC 5.3 3.7*  NEUTROABS 2.2 3.3  HGB 11.5* 9.9*  HCT 36.5 30.8*  MCV 92.4 90.3  PLT 253 189   Cardiac Enzymes: No results for input(s):  "CKTOTAL", "CKMB", "CKMBINDEX", "TROPONINI" in the last 168 hours. BNP (last 3 results) No results for input(s): "PROBNP" in the last 8760 hours. CBG: No results for input(s): "GLUCAP" in the last 168 hours. D-Dimer: No results for input(s): "DDIMER" in the last 72 hours. Hgb A1c: No results for input(s): "HGBA1C" in the last 72 hours. Lipid Profile: No results for input(s): "CHOL", "HDL", "LDLCALC", "TRIG", "CHOLHDL", "LDLDIRECT" in the last 72 hours. Thyroid function studies: No results for input(s): "TSH", "T4TOTAL", "T3FREE", "THYROIDAB" in the last 72 hours.  Invalid input(s): "FREET3" Anemia work up: Recent Labs    04/06/23 0859  VITAMINB12 247  FOLATE 4.1*  FERRITIN 24   Sepsis Labs: Recent Labs  Lab 04/05/23 2330 04/06/23 0521  WBC 5.3 3.7*   Microbiology No results found for this or any previous visit (from the past 240 hour(s)).   Medications:    acidophilus  2 capsule Oral Daily   amiodarone  200 mg Oral Daily   apixaban  5 mg Oral BID   bismuth subsalicylate  30 mL Oral TID AC & HS   busPIRone  5 mg Oral BID   cholecalciferol  1,000 Units Oral Daily   escitalopram  20 mg Oral Daily   ferrous sulfate  325 mg Oral QODAY   fluticasone furoate-vilanterol  1 puff Inhalation Daily   And   umeclidinium bromide  1 puff Inhalation Daily   gabapentin  800 mg Oral TID   nicotine  14 mg Transdermal Daily   OXcarbazepine  150 mg Oral BID   pantoprazole  40 mg Oral Daily   Continuous Infusions:  doxycycline (VIBRAMYCIN) IV 100 mg (04/06/23 2114)      LOS: 1 day   Marinda Elk  [facility]  04/07/2023, 8:59 AM

## 2023-04-08 DIAGNOSIS — L03116 Cellulitis of left lower limb: Secondary | ICD-10-CM | POA: Diagnosis not present

## 2023-04-08 DIAGNOSIS — E876 Hypokalemia: Secondary | ICD-10-CM | POA: Diagnosis not present

## 2023-04-08 LAB — RENAL FUNCTION PANEL
Albumin: 2.6 g/dL — ABNORMAL LOW (ref 3.5–5.0)
Anion gap: 6 (ref 5–15)
BUN: 6 mg/dL — ABNORMAL LOW (ref 8–23)
CO2: 23 mmol/L (ref 22–32)
Calcium: 7.4 mg/dL — ABNORMAL LOW (ref 8.9–10.3)
Chloride: 106 mmol/L (ref 98–111)
Creatinine, Ser: 0.62 mg/dL (ref 0.44–1.00)
GFR, Estimated: >60 mL/min (ref >60)
Glucose, Bld: 97 mg/dL (ref 70–99)
Phosphorus: 4.4 mg/dL (ref 2.5–4.6)
Potassium: 4.4 mmol/L (ref 3.5–5.1)
Sodium: 135 mmol/L (ref 135–145)

## 2023-04-08 MED ORDER — AMLODIPINE BESYLATE 5 MG PO TABS
2.5000 mg | ORAL_TABLET | Freq: Every day | ORAL | Status: DC
  Administered 2023-04-08 – 2023-04-09 (×2): 2.5 mg via ORAL
  Filled 2023-04-08 (×2): qty 1

## 2023-04-08 NOTE — Plan of Care (Signed)
  Problem: Education: Goal: Knowledge of General Education information will improve Description: Including pain rating scale, medication(s)/side effects and non-pharmacologic comfort measures Outcome: Progressing   Problem: Health Behavior/Discharge Planning: Goal: Ability to manage health-related needs will improve Outcome: Progressing   Problem: Clinical Measurements: Goal: Ability to maintain clinical measurements within normal limits will improve Outcome: Progressing Goal: Will remain free from infection Outcome: Progressing Goal: Diagnostic test results will improve Outcome: Progressing Goal: Respiratory complications will improve Outcome: Adequate for Discharge Goal: Cardiovascular complication will be avoided Outcome: Adequate for Discharge   Problem: Activity: Goal: Risk for activity intolerance will decrease Outcome: Adequate for Discharge   Problem: Nutrition: Goal: Adequate nutrition will be maintained Outcome: Adequate for Discharge   Problem: Coping: Goal: Level of anxiety will decrease Outcome: Progressing   Problem: Elimination: Goal: Will not experience complications related to bowel motility Outcome: Completed/Met Goal: Will not experience complications related to urinary retention Outcome: Completed/Met   Problem: Pain Management: Goal: General experience of comfort will improve Outcome: Progressing   Problem: Safety: Goal: Ability to remain free from injury will improve Outcome: Progressing   Problem: Skin Integrity: Goal: Risk for impaired skin integrity will decrease Outcome: Progressing

## 2023-04-08 NOTE — Plan of Care (Signed)
  Problem: Clinical Measurements: Goal: Will remain free from infection Outcome: Progressing Goal: Diagnostic test results will improve Outcome: Progressing Goal: Cardiovascular complication will be avoided Outcome: Progressing   

## 2023-04-08 NOTE — Progress Notes (Signed)
[facility] PROGRESS NOTE    Progress Note  Karen Casey  ZOX:[id number] DOB: 12/19/1955 DOA: 04/05/2023 PCP: Billey Co, MD     Brief Narrative:   Karen Casey is an 67 y.o. 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  C. difficile PCR on the ED visit on 03/13/2023 was negative, also complaining of red patches on her lower extremity painful   Assessment/Plan:   Electrolyte imbalance hypokalemia/hypomagnesemia: Electrolytes have been repleted her potassium is 4.4 magnesium is 2.0 and phosphorus 4.4  Bilateral lower extremity Cellulitis She has remained afebrile with no leukocytosis. Return is worse today on the left leg and the swelling. Continue antibiotics will monitor for 1 additional day.  Chronic diarrhea: C. difficile PCR - 03/04/2023, GI panel has been negative.  Normocytic anemia Repeat folate orally, B12 subcutaneously.  He was also started on thiamine due to her history of alcohol abuse. Ferritin of 24, continue on Ferrous sulfate. Vitamin B12 247, being repleted.  Tobacco use Counseling.  Paroxysmal atrial fibrillation (HCC) Continue amiodarone and Eliquis. Not on any rate controlling medication.  Elevated blood pressure without a diagnosis of hypertension: Blood pressure is elevated will start on low-dose Norvasc.  Depression with anxiety Continue current regimen.  Peripheral neuropathy: Continue Neurontin.  History of alcohol abuse: Started on thiamine and folate. She will she does not drink every day she has no signs of withdrawal.    DVT prophylaxis: Eliquis Family Communication:none Status is: Inpatient Remains inpatient appropriate because: Electrolyte imbalance hypokalemia/hypomagnesemia    Code Status:      Code Status Orders  (From admission, onward)           Start     Ordered   04/06/23 0457  Full code  Continuous       Question:  By:  Answer:  Consent: discussion documented in EHR   04/06/23 0457           Code Status History     Date Active Date Inactive Code Status Order ID Comments User Context   01/31/2023 2204 02/03/2023 1656 Full Code [id number]  Charlsie Quest, MD ED   12/06/2022 1409 12/06/2022 1942 Full Code [id number]  Karen Rout, MD Inpatient   11/27/2022 2056 11/28/2022 1659 Full Code [id number]  Karen Bo, NP ED   07/27/2022 1512 07/30/2022 1632 Full Code [id number]  Karen Mo, MD ED   07/26/2022 0518 07/27/2022 1512 Full Code [id number]  Karen Hatchet, PA-C ED   02/09/2022 0002 02/09/2022 2304 Full Code [id number]  Glendale Chard, DO ED   01/25/2022 1733 01/30/2022 0149 Full Code [id number]  Karen Martinez, MD ED   01/02/2022 1350 01/07/2022 2320 Full Code [id number]  Karen Spike, DO ED   11/21/2021 0034 12/01/2021 2222 Full Code [id number]  Karen Herter, DO ED   11/20/2021 2353 11/21/2021 0034 Full Code [id number]  Karen Herter, DO ED   07/27/2021 1630 07/29/2021 2003 Full Code [id number]  Karen Martinez, MD ED   06/21/2021 0420 06/24/2021 1630 Full Code [id number]  Karen Leyden, DO ED   06/21/2021 0330 06/21/2021 0420 Full Code [id number]  Karen Booze, MD ED   01/02/2021 2136 01/05/2021 2205 Full Code [id number]  Karen Octave, MD ED   10/12/2020 2105 10/17/2020 2217 Full Code [id number]  Karen Calkin, MD ED  09/25/2020 1749 09/28/2020 1913 Full Code [id number]  Karen Deeds, MD ED   09/10/2020 0011 09/22/2020 2234 Full Code [id number]  Karen Lair, PA-C ED   09/09/2020 2129 09/10/2020 0011 Full Code [id number]  Karen Savoy, MD ED   09/27/2017 0215 09/29/2017 1547 Full Code [id number]  Karen Royalty, DO Inpatient   08/03/2012 1943 08/04/2012 0545 Full Code [id number]  Karen Haggard, NP ED   07/11/2012 0006 07/11/2012 1553 Full Code [id number]  Karen Casey, Herbert Seta, PA-C ED    05/25/2012 2044 05/26/2012 0913 Full Code [id number]  Karen Captain, PA-C ED         IV Access:   Peripheral IV   Procedures and diagnostic studies:   No results found.   Medical Consultants:   None.   Subjective:    [name] relate to her left leg is bothering her.  Objective:    Vitals:   04/07/23 0723 04/07/23 1345 04/07/23 2157 04/08/23 0534  BP:  (!) 154/78 137/79 (!) 156/100  Pulse:  80 93 93  Resp:  18 16 17   Temp:  97.9 F (36.6 C) 98.1 F (36.7 C)   TempSrc:   Oral   SpO2: 98% 98% 95% 95%  Weight:      Height:       SpO2: 95 %   Intake/Output Summary (Last 24 hours) at 04/08/2023 0739 Last data filed at 04/08/2023 0200 Gross per 24 hour  Intake 2329.91 ml  Output 0 ml  Net 2329.91 ml   Filed Weights   04/05/23 2310  Weight: 65.3 kg    Exam: General exam: In no acute distress. Respiratory system: Good air movement and clear to auscultation. Cardiovascular system: S1 & S2 heard, RRR. No JVD. Gastrointestinal system: Abdomen is nondistended, soft and nontender.  Extremities: No pedal edema. Skin: Erythema and swelling of the right leg is worse. Psychiatry: Judgement and insight appear normal. Mood & affect appropriate.  Data Reviewed:    Labs: Basic Metabolic Panel: Recent Labs  Lab 04/05/23 2330 04/06/23 0059 04/06/23 0521 04/07/23 0357 04/08/23 0346  NA 137  --  137 137 135  K 2.0*  --  2.9* 5.2* 4.4  CL 102  --  102 106 106  CO2 21*  --  23 24 23   GLUCOSE 135*  --  208* 110* 97  BUN <5*  --  <5* 6* 6*  CREATININE 0.54  --  0.66 0.58 0.62  CALCIUM 7.4*  --  7.2* 7.9* 7.4*  MG  --  1.0* 0.9* 2.0  --   PHOS  --   --   --  1.3* 4.4   GFR Estimated Creatinine Clearance: 61.3 mL/min (by C-G formula based on SCr of 0.62 mg/dL). Liver Function Tests: Recent Labs  Lab 04/05/23 2330 04/07/23 0357 04/08/23 0346  AST 30  --   --   ALT 17  --   --   ALKPHOS 160*  --   --   BILITOT 0.5  --   --   PROT 6.2*  --    --   ALBUMIN 2.8* 2.7* 2.6*   No results for input(s): "LIPASE", "AMYLASE" in the last 168 hours. No results for input(s): "AMMONIA" in the last 168 hours. Coagulation profile No results for input(s): "INR", "PROTIME" in the last 168 hours. COVID-19 Labs  Recent Labs    04/06/23 0859  FERRITIN 24    Lab Results  Component Value Date   SARSCOV2NAA NEGATIVE 01/31/2023   SARSCOV2NAA NEGATIVE 07/26/2022  SARSCOV2NAA NEGATIVE 02/08/2022   SARSCOV2NAA NEGATIVE 07/28/2021    CBC: Recent Labs  Lab 04/05/23 2330 04/06/23 0521  WBC 5.3 3.7*  NEUTROABS 2.2 3.3  HGB 11.5* 9.9*  HCT 36.5 30.8*  MCV 92.4 90.3  PLT 253 189   Cardiac Enzymes: No results for input(s): "CKTOTAL", "CKMB", "CKMBINDEX", "TROPONINI" in the last 168 hours. BNP (last 3 results) No results for input(s): "PROBNP" in the last 8760 hours. CBG: No results for input(s): "GLUCAP" in the last 168 hours. D-Dimer: No results for input(s): "DDIMER" in the last 72 hours. Hgb A1c: No results for input(s): "HGBA1C" in the last 72 hours. Lipid Profile: No results for input(s): "CHOL", "HDL", "LDLCALC", "TRIG", "CHOLHDL", "LDLDIRECT" in the last 72 hours. Thyroid function studies: No results for input(s): "TSH", "T4TOTAL", "T3FREE", "THYROIDAB" in the last 72 hours.  Invalid input(s): "FREET3" Anemia work up: Recent Labs    04/06/23 0859  VITAMINB12 247  FOLATE 4.1*  FERRITIN 24   Sepsis Labs: Recent Labs  Lab 04/05/23 2330 04/06/23 0521  WBC 5.3 3.7*   Microbiology Recent Results (from the past 240 hour(s))  Gastrointestinal Panel by PCR , Stool     Status: None   Collection Time: 04/06/23  2:48 PM   Specimen: Stool  Result Value Ref Range Status   Campylobacter species NOT DETECTED NOT DETECTED Final   Plesimonas shigelloides NOT DETECTED NOT DETECTED Final   Salmonella species NOT DETECTED NOT DETECTED Final   Yersinia enterocolitica NOT DETECTED NOT DETECTED Final   Vibrio species NOT  DETECTED NOT DETECTED Final   Vibrio cholerae NOT DETECTED NOT DETECTED Final   Enteroaggregative E coli (EAEC) NOT DETECTED NOT DETECTED Final   Enteropathogenic E coli (EPEC) NOT DETECTED NOT DETECTED Final   Enterotoxigenic E coli (ETEC) NOT DETECTED NOT DETECTED Final   Shiga like toxin producing E coli (STEC) NOT DETECTED NOT DETECTED Final   Shigella/Enteroinvasive E coli (EIEC) NOT DETECTED NOT DETECTED Final   Cryptosporidium NOT DETECTED NOT DETECTED Final   Cyclospora cayetanensis NOT DETECTED NOT DETECTED Final   Entamoeba histolytica NOT DETECTED NOT DETECTED Final   Giardia lamblia NOT DETECTED NOT DETECTED Final   Adenovirus F40/41 NOT DETECTED NOT DETECTED Final   Astrovirus NOT DETECTED NOT DETECTED Final   Norovirus GI/GII NOT DETECTED NOT DETECTED Final   Rotavirus A NOT DETECTED NOT DETECTED Final   Sapovirus (I, II, IV, and V) NOT DETECTED NOT DETECTED Final    Comment: Performed at [facility], [address] [place], [place] [address]     Medications:    acidophilus  2 capsule Oral Daily   amiodarone  200 mg Oral Daily   apixaban  5 mg Oral BID   bismuth subsalicylate  30 mL Oral TID AC & HS   busPIRone  5 mg Oral BID   cholecalciferol  1,000 Units Oral Daily   cyanocobalamin  1,000 mcg Subcutaneous Daily   doxycycline  100 mg Oral Q12H   escitalopram  20 mg Oral Daily   ferrous sulfate  325 mg Oral QODAY   fluticasone furoate-vilanterol  1 puff Inhalation Daily   And   umeclidinium bromide  1 puff Inhalation Daily   folic acid  1 mg Oral Daily   gabapentin  800 mg Oral TID   nicotine  14 mg Transdermal Daily   OXcarbazepine  150 mg Oral BID   pantoprazole  40 mg Oral Daily   silver sulfADIAZINE   Topical Daily   thiamine  100  mg Oral Daily   Continuous Infusions:  sodium chloride 75 mL/hr at 04/08/23 0233      LOS: 2 days   Marinda Elk  [facility]  04/08/2023, 7:39 AM

## 2023-04-09 DIAGNOSIS — L03116 Cellulitis of left lower limb: Secondary | ICD-10-CM | POA: Diagnosis not present

## 2023-04-09 DIAGNOSIS — E876 Hypokalemia: Secondary | ICD-10-CM | POA: Diagnosis not present

## 2023-04-09 MED ORDER — AMLODIPINE BESYLATE 2.5 MG PO TABS: 2.5000 mg | ORAL_TABLET | Freq: Every day | ORAL | 0 refills | Status: DC

## 2023-04-09 MED ORDER — DOXYCYCLINE HYCLATE 100 MG PO TABS: 100.0000 mg | ORAL_TABLET | Freq: Two times a day (BID) | ORAL | 0 refills | Status: AC

## 2023-04-09 MED ORDER — FOLIC ACID 1 MG PO TABS: 1.0000 mg | ORAL_TABLET | Freq: Every day | ORAL | 0 refills | Status: AC

## 2023-04-09 NOTE — Plan of Care (Signed)
  Problem: Education: Goal: Knowledge of General Education information will improve Description: Including pain rating scale, medication(s)/side effects and non-pharmacologic comfort measures Outcome: Adequate for Discharge   Problem: Health Behavior/Discharge Planning: Goal: Ability to manage health-related needs will improve Outcome: Adequate for Discharge   Problem: Clinical Measurements: Goal: Ability to maintain clinical measurements within normal limits will improve Outcome: Adequate for Discharge Goal: Will remain free from infection Outcome: Adequate for Discharge Goal: Diagnostic test results will improve Outcome: Adequate for Discharge Goal: Respiratory complications will improve Outcome: Adequate for Discharge Goal: Cardiovascular complication will be avoided Outcome: Adequate for Discharge   Problem: Activity: Goal: Risk for activity intolerance will decrease Outcome: Adequate for Discharge   Problem: Nutrition: Goal: Adequate nutrition will be maintained Outcome: Adequate for Discharge   Problem: Coping: Goal: Level of anxiety will decrease Outcome: Adequate for Discharge   Problem: Pain Management: Goal: General experience of comfort will improve Outcome: Adequate for Discharge   Problem: Safety: Goal: Ability to remain free from injury will improve Outcome: Adequate for Discharge   Problem: Skin Integrity: Goal: Risk for impaired skin integrity will decrease Outcome: Adequate for Discharge

## 2023-04-09 NOTE — Discharge Summary (Signed)
Physician Discharge Summary  Karen Casey UJW:[id number] DOB: 1956-01-11 DOA: 04/05/2023  PCP: Billey Co, MD  Admit date: 04/05/2023 Discharge date: 04/09/2023  Admitted From: Home Disposition:  Home  Recommendations for Outpatient Follow-up:  Follow up with PCP in 1-2 weeks Please obtain BMP/CBC in one week   Home Health:No Equipment/Devices:None  Discharge Condition:Stable CODE STATUS:Full Diet recommendation: Heart Healthy    Brief/Interim Summary: 67 y.o. 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  C. difficile PCR on the ED visit on 03/13/2023 was negative, also complaining of red patches on her lower extremity painful   Discharge Diagnoses:  Principal Problem:   Cellulitis Active Problems:   Tobacco use   COPD (chronic obstructive pulmonary disease) (HCC)   Hypokalemia   Paroxysmal atrial fibrillation (HCC)   HTN (hypertension)   Depression with anxiety   Hypomagnesemia  Electrolyte imbalance hypokalemia/hypomagnesemia: These were repleted orally they improved.  Bilateral lower extremity cellulitis: She was treated with IV doxycycline erythema resolved swelling improved. She will continue doxycycline as an outpatient.  Chronic diarrhea: C. difficile PCR 03/04/2023 was negative GI panel is negative follow-up with GI doctor as an outpatient.  Normocytic anemia: Folate was low replete orally should continue oral folate As an outpatient. B12 was 247 she was given subcutaneously she will follow-up with PCP as an outpatient.  Tobacco: She has been counseled.  Paroxysmal atrial fibrillation: No change made to her medication continue amiodarone and Eliquis.  Newly diagnosed essential hypertension: She will start on low-dose Norvasc her blood  pressure is better controlled.  Depressions with anxiety: Continue current regimen no changes made.  Peripheral neuropathy: Continue Neurontin.  History of alcohol use: She started on thiamine and folate. No signs of withdrawal.  Discharge Instructions  Discharge Instructions     Diet - low sodium heart healthy   Complete by: As directed    Increase activity slowly   Complete by: As directed    No wound care   Complete by: As directed       Allergies as of 04/09/2023       Reactions   Capsaicin-menthol Rash, Other (See Comments)   Burning and peeling on area applied to   Diclo Gel [diclofenac Sodium] Rash, Other (See Comments)   Burning and peeling at the sight of application.        Medication List     TAKE these medications    albuterol 108 (90 Base) MCG/ACT inhaler Commonly known as: VENTOLIN HFA Inhale 2 puffs into the lungs every 6 (six) hours as needed for wheezing or shortness of breath.   amiodarone 200 MG tablet Commonly known as: PACERONE Take 1 tablet (200 mg total) by mouth daily.   amLODipine 2.5 MG tablet Commonly known as: NORVASC Take 1 tablet (2.5 mg total) by mouth daily. Start taking on: April 10, 2023   apixaban 5 MG Tabs tablet Commonly known as: ELIQUIS Take 1 tablet (5 mg total) by mouth 2 (two) times daily.   busPIRone 5 MG tablet Commonly known as: BUSPAR Take 5 mg by mouth 2 (two) times daily.   CENTRUM ADULT PO Take 1 tablet by mouth daily.   cholecalciferol 25 MCG (1000 UNIT) tablet Commonly known as: VITAMIN D3 Take 1,000 Units by mouth daily.   clonazePAM 0.5 MG tablet Commonly known  as: KLONOPIN Take 1 tablet (0.5 mg total) by mouth 2 (two) times daily as needed for anxiety. Ok to fill 12/29/22   denosumab 60 MG/ML Sosy injection Commonly known as: PROLIA Inject 60 mg into the skin every 6 (six) months.   doxycycline 100 MG tablet Commonly known as: VIBRA-TABS Take 1 tablet (100 mg total) by mouth every 12  (twelve) hours for 5 days.   escitalopram 20 MG tablet Commonly known as: LEXAPRO Take 1 tablet (20 mg total) by mouth daily.   ferrous sulfate 325 (65 FE) MG tablet Take 1 tablet (325 mg total) by mouth every other day.   gabapentin 400 MG capsule Commonly known as: NEURONTIN Take 2 capsules (800 mg total) by mouth 3 (three) times daily.   lidocaine 4 % Place 1 patch onto the skin daily. What changed:  when to take this reasons to take this   loperamide 2 MG capsule Commonly known as: IMODIUM Take 1 capsule (2 mg total) by mouth 4 (four) times daily as needed for diarrhea or loose stools.   ondansetron 4 MG tablet Commonly known as: ZOFRAN Take 1 tablet (4 mg total) by mouth every 6 (six) hours.   OXcarbazepine 150 MG tablet Commonly known as: TRILEPTAL Take 150 mg by mouth 2 (two) times daily.   pantoprazole 40 MG tablet Commonly known as: Protonix Take 1 tablet (40 mg total) by mouth daily.   traZODone 50 MG tablet Commonly known as: DESYREL Take 1 tablet (50 mg total) by mouth at bedtime as needed for sleep. What changed: when to take this   Trelegy Ellipta 100-62.5-25 MCG/ACT Aepb Generic drug: Fluticasone-Umeclidin-Vilant Inhale 1 puff into the lungs daily.        Allergies  Allergen Reactions   Capsaicin-Menthol Rash and Other (See Comments)    Burning and peeling on area applied to   Diclo Gel [Diclofenac Sodium] Rash and Other (See Comments)    Burning and peeling at the sight of application.    Consultations: None   Procedures/Studies: DG Tibia/Fibula Left  Result Date: 04/06/2023 CLINICAL DATA:  144615 Pain 144615 EXAM: LEFT TIBIA AND FIBULA - 2 VIEW COMPARISON:  None Available. FINDINGS: There is no evidence of fracture or other focal bone lesions. Soft tissues are unremarkable. IMPRESSION: Negative. Electronically Signed   By: Tish Frederickson M.D.   On: 04/06/2023 01:45   DG Ankle Complete Left  Result Date: 04/06/2023 CLINICAL DATA:   leg swelling/pain, cellulitis EXAM: LEFT ANKLE COMPLETE - 3+ VIEW COMPARISON:  X-ray left ankle 08/07/2021 FINDINGS: There is no evidence of fracture, dislocation, or joint effusion. There is no evidence of arthropathy or other focal bone abnormality. Soft tissues are unremarkable. IMPRESSION: No acute displaced fracture or dislocation. Electronically Signed   By: Tish Frederickson M.D.   On: 04/06/2023 01:35   DG Chest Portable 1 View  Result Date: 04/06/2023 CLINICAL DATA:  shortness of breath.  Swelling/ pain and cellulitis EXAM: PORTABLE CHEST 1 VIEW COMPARISON:  Chest x-ray 03/13/2023, CT chest 01/28/2023, chest x-ray 01/31/2023 FINDINGS: The heart and mediastinal contours are unchanged. Atherosclerotic plaque. No focal consolidation. No pulmonary edema. No pleural effusion. No pneumothorax. No acute osseous abnormality. IMPRESSION: 1. No active disease. 2.  Aortic Atherosclerosis (ICD10-I70.0). Electronically Signed   By: Tish Frederickson M.D.   On: 04/06/2023 01:31   DG Chest 2 View  Result Date: 03/13/2023 CLINICAL DATA:  Loculation at right lung base. Abdominal pain, nausea, and vomiting. EXAM: CHEST - 2 VIEW COMPARISON:  03/03/2023.  FINDINGS: The heart size and mediastinal contours are within normal limits. There is atherosclerotic calcification of the aorta. There is a small right pleural effusion with atelectasis. The left lung is clear. No pneumothorax. Degenerative changes are present in the thoracic spine. A stable compression deformity is noted in the mid to upper thoracic spine. IMPRESSION: Small right pleural effusion with atelectasis. Electronically Signed   By: Thornell Sartorius M.D.   On: 03/13/2023 03:47   CT ABDOMEN PELVIS W CONTRAST  Result Date: 03/13/2023 CLINICAL DATA:  Acute nonlocalized abdominal pain. Nausea, vomiting, and diarrhea for 1 day. EXAM: CT ABDOMEN AND PELVIS WITH CONTRAST TECHNIQUE: Multidetector CT imaging of the abdomen and pelvis was performed using the standard  protocol following bolus administration of intravenous contrast. RADIATION DOSE REDUCTION: This exam was performed according to the departmental dose-optimization program which includes automated exposure control, adjustment of the mA and/or kV according to patient size and/or use of iterative reconstruction technique. CONTRAST:  OMNIPAQUE IOHEXOL 300 MG/ML  SOLN COMPARISON:  02/08/2022 FINDINGS: Lower chest: Loculated appearing thick-walled right pleural effusion, possibly abscess or empyema. Atelectasis in the right lung base. Hepatobiliary: Scattered subcentimeter low-attenuation lesions in the liver without change since prior study, likely small cysts or hemangiomas. Gallbladder and bile ducts are unremarkable. Pancreas: Unremarkable. No pancreatic ductal dilatation or surrounding inflammatory changes. Spleen: Normal in size without focal abnormality. Adrenals/Urinary Tract: Adrenal glands are unremarkable. Kidneys are normal, without renal calculi, focal lesion, or hydronephrosis. Bladder is unremarkable. Stomach/Bowel: Stomach, small bowel, and colon are not abnormally distended. No wall thickening or inflammatory changes. Colonic diverticula without evidence of acute diverticulitis. Appendix appears to be surgically absent. Vascular/Lymphatic: Aortic atherosclerosis. No enlarged abdominal or pelvic lymph nodes. Reproductive: Uterus and bilateral adnexa are unremarkable. Other: No abdominal wall hernia or abnormality. No abdominopelvic ascites. Musculoskeletal: No acute or significant osseous findings. IMPRESSION: 1. Loculated pleural fluid in the right lung base, possibly empyema or abscess. Atelectasis in the right lower lung. 2. No bowel obstruction or inflammation. Colonic diverticula without evidence of acute diverticulitis. 3. Aortic atherosclerosis. Electronically Signed   By: Burman Nieves M.D.   On: 03/13/2023 02:28   (Echo, Carotid, EGD, Colonoscopy, ERCP)    Subjective: No  complaints  Discharge Exam: Vitals:   04/08/23 2014 04/09/23 0652  BP: 119/66 (!) 137/90  Pulse: 89 96  Resp: 18 16  Temp: 98 F (36.7 C) 97.6 F (36.4 C)  SpO2: 95% 94%   Vitals:   04/08/23 0831 04/08/23 1412 04/08/23 2014 04/09/23 0652  BP:  (!) 141/78 119/66 (!) 137/90  Pulse:  89 89 96  Resp:  18 18 16   Temp:  (!) 97.5 F (36.4 C) 98 F (36.7 C) 97.6 F (36.4 C)  TempSrc:      SpO2: 96% 98% 95% 94%  Weight:      Height:        General: Pt is alert, awake, not in acute distress Cardiovascular: RRR, S1/S2 +, no rubs, no gallops Respiratory: CTA bilaterally, no wheezing, no rhonchi Abdominal: Soft, NT, ND, bowel sounds + Extremities: no edema, no cyanosis    The results of significant diagnostics from this hospitalization (including imaging, microbiology, ancillary and laboratory) are listed below for reference.     Microbiology: Recent Results (from the past 240 hour(s))  Gastrointestinal Panel by PCR , Stool     Status: None   Collection Time: 04/06/23  2:48 PM   Specimen: Stool  Result Value Ref Range Status   Campylobacter species  NOT DETECTED NOT DETECTED Final   Plesimonas shigelloides NOT DETECTED NOT DETECTED Final   Salmonella species NOT DETECTED NOT DETECTED Final   Yersinia enterocolitica NOT DETECTED NOT DETECTED Final   Vibrio species NOT DETECTED NOT DETECTED Final   Vibrio cholerae NOT DETECTED NOT DETECTED Final   Enteroaggregative E coli (EAEC) NOT DETECTED NOT DETECTED Final   Enteropathogenic E coli (EPEC) NOT DETECTED NOT DETECTED Final   Enterotoxigenic E coli (ETEC) NOT DETECTED NOT DETECTED Final   Shiga like toxin producing E coli (STEC) NOT DETECTED NOT DETECTED Final   Shigella/Enteroinvasive E coli (EIEC) NOT DETECTED NOT DETECTED Final   Cryptosporidium NOT DETECTED NOT DETECTED Final   Cyclospora cayetanensis NOT DETECTED NOT DETECTED Final   Entamoeba histolytica NOT DETECTED NOT DETECTED Final   Giardia lamblia NOT DETECTED  NOT DETECTED Final   Adenovirus F40/41 NOT DETECTED NOT DETECTED Final   Astrovirus NOT DETECTED NOT DETECTED Final   Norovirus GI/GII NOT DETECTED NOT DETECTED Final   Rotavirus A NOT DETECTED NOT DETECTED Final   Sapovirus (I, II, IV, and V) NOT DETECTED NOT DETECTED Final    Comment: Performed at [facility], [address] [place], [place] [address]     Labs: BNP (last 3 results) Recent Labs    01/31/23 1850 04/05/23 2330  BNP 240.7* 150.0*   Basic Metabolic Panel: Recent Labs  Lab 04/05/23 2330 04/06/23 0059 04/06/23 0521 04/07/23 0357 04/08/23 0346  NA 137  --  137 137 135  K 2.0*  --  2.9* 5.2* 4.4  CL 102  --  102 106 106  CO2 21*  --  23 24 23   GLUCOSE 135*  --  208* 110* 97  BUN <5*  --  <5* 6* 6*  CREATININE 0.54  --  0.66 0.58 0.62  CALCIUM 7.4*  --  7.2* 7.9* 7.4*  MG  --  1.0* 0.9* 2.0  --   PHOS  --   --   --  1.3* 4.4   Liver Function Tests: Recent Labs  Lab 04/05/23 2330 04/07/23 0357 04/08/23 0346  AST 30  --   --   ALT 17  --   --   ALKPHOS 160*  --   --   BILITOT 0.5  --   --   PROT 6.2*  --   --   ALBUMIN 2.8* 2.7* 2.6*   No results for input(s): "LIPASE", "AMYLASE" in the last 168 hours. No results for input(s): "AMMONIA" in the last 168 hours. CBC: Recent Labs  Lab 04/05/23 2330 04/06/23 0521  WBC 5.3 3.7*  NEUTROABS 2.2 3.3  HGB 11.5* 9.9*  HCT 36.5 30.8*  MCV 92.4 90.3  PLT 253 189   Cardiac Enzymes: No results for input(s): "CKTOTAL", "CKMB", "CKMBINDEX", "TROPONINI" in the last 168 hours. BNP: Invalid input(s): "POCBNP" CBG: No results for input(s): "GLUCAP" in the last 168 hours. D-Dimer No results for input(s): "DDIMER" in the last 72 hours. Hgb A1c No results for input(s): "HGBA1C" in the last 72 hours. Lipid Profile No results for input(s): "CHOL", "HDL", "LDLCALC", "TRIG", "CHOLHDL", "LDLDIRECT" in the last 72 hours. Thyroid function studies No results for input(s): "TSH", "T4TOTAL", "T3FREE",  "THYROIDAB" in the last 72 hours.  Invalid input(s): "FREET3" Anemia work up No results for input(s): "VITAMINB12", "FOLATE", "FERRITIN", "TIBC", "IRON", "RETICCTPCT" in the last 72 hours. Urinalysis    Component Value Date/Time   COLORURINE COLORLESS (A) 04/05/2023 2330   APPEARANCEUR CLEAR 04/05/2023 2330   LABSPEC  1.002 (L) 04/05/2023 2330   PHURINE 7.0 04/05/2023 2330   GLUCOSEU NEGATIVE 04/05/2023 2330   HGBUR NEGATIVE 04/05/2023 2330   BILIRUBINUR NEGATIVE 04/05/2023 2330   BILIRUBINUR negative 08/23/2017 0936   BILIRUBINUR NEG 12/22/2015 1410   KETONESUR NEGATIVE 04/05/2023 2330   PROTEINUR NEGATIVE 04/05/2023 2330   UROBILINOGEN 0.2 08/23/2017 0936   UROBILINOGEN 1.0 03/09/2013 1013   NITRITE NEGATIVE 04/05/2023 2330   LEUKOCYTESUR TRACE (A) 04/05/2023 2330   Sepsis Labs Recent Labs  Lab 04/05/23 2330 04/06/23 0521  WBC 5.3 3.7*   Microbiology Recent Results (from the past 240 hour(s))  Gastrointestinal Panel by PCR , Stool     Status: None   Collection Time: 04/06/23  2:48 PM   Specimen: Stool  Result Value Ref Range Status   Campylobacter species NOT DETECTED NOT DETECTED Final   Plesimonas shigelloides NOT DETECTED NOT DETECTED Final   Salmonella species NOT DETECTED NOT DETECTED Final   Yersinia enterocolitica NOT DETECTED NOT DETECTED Final   Vibrio species NOT DETECTED NOT DETECTED Final   Vibrio cholerae NOT DETECTED NOT DETECTED Final   Enteroaggregative E coli (EAEC) NOT DETECTED NOT DETECTED Final   Enteropathogenic E coli (EPEC) NOT DETECTED NOT DETECTED Final   Enterotoxigenic E coli (ETEC) NOT DETECTED NOT DETECTED Final   Shiga like toxin producing E coli (STEC) NOT DETECTED NOT DETECTED Final   Shigella/Enteroinvasive E coli (EIEC) NOT DETECTED NOT DETECTED Final   Cryptosporidium NOT DETECTED NOT DETECTED Final   Cyclospora cayetanensis NOT DETECTED NOT DETECTED Final   Entamoeba histolytica NOT DETECTED NOT DETECTED Final   Giardia lamblia  NOT DETECTED NOT DETECTED Final   Adenovirus F40/41 NOT DETECTED NOT DETECTED Final   Astrovirus NOT DETECTED NOT DETECTED Final   Norovirus GI/GII NOT DETECTED NOT DETECTED Final   Rotavirus A NOT DETECTED NOT DETECTED Final   Sapovirus (I, II, IV, and V) NOT DETECTED NOT DETECTED Final    Comment: Performed at [facility], [address], [place], [place] [address]     Time coordinating discharge: Over 35 minutes  SIGNED:   Marinda Elk, MD  [facility] 04/09/2023, 10:10 AM Pager   If 7PM-7AM, please contact night-coverage [email or web address] Password [email or web address]

## 2023-04-11 ENCOUNTER — Telehealth: Payer: Self-pay

## 2023-04-11 NOTE — Transitions of Care (Post Inpatient/ED Visit) (Signed)
04/11/2023  Name: AJEE MCKANE MRN: [id number] DOB: [birth year]  Today's TOC FU Call Status:    Attempted to reach the patient regarding the most recent Inpatient/ED visit.  Follow Up Plan: Additional outreach attempts will be made to reach the patient to complete the Transitions of Care (Post Inpatient/ED visit) call.   Jodelle Gross RN, BSN, CCM RN Care Manager  Transitions of Care  [facility] - [facility]  [phone]

## 2023-04-12 ENCOUNTER — Telehealth: Payer: Self-pay

## 2023-04-12 NOTE — Transitions of Care (Post Inpatient/ED Visit) (Signed)
04/12/2023  Name: Karen Casey MRN: [id number] DOB: May 02, 1956  Today's TOC FU Call Status: Today's TOC FU Call Status:: Successful TOC FU Call Completed TOC FU Call Complete Date: 04/12/23 Patient's Name and Date of Birth confirmed.  Transition Care Management Follow-up Telephone Call Date of Discharge: 04/09/23 Discharge Facility: [facility]) Type of Discharge: Inpatient Admission Primary Inpatient Discharge Diagnosis:: Bilateral Lower Extremity Cellulitis How have you been since you were released from the hospital?: Same (Patient complains she doesn't feel well, she has diarrhea, vomiting and red spots on her arms.) Any questions or concerns?: Yes Patient Questions/Concerns:: Patient has concerns that she felt she had a bad hospital experience.  Patient was short and upset on phone with this RNCC.  Provided patient experience [phone]  Items Reviewed: Did you receive and understand the discharge instructions provided?: Yes Medications obtained,verified, and reconciled?: Yes (Medications Reviewed) Any new allergies since your discharge?: No Dietary orders reviewed?: No Do you have support at home?: Yes Name of Support/Comfort Primary Source: Debra  Medications Reviewed Today: Medications Reviewed Today     Reviewed by Jodelle Gross, RN (Case Manager) on 04/12/23 at 1529  Med List Status: <None>   Medication Order Taking? Sig Documenting Provider Last Dose Status Informant  albuterol (VENTOLIN HFA) 108 (90 Base) MCG/ACT inhaler [id number] Yes Inhale 2 puffs into the lungs every 6 (six) hours as needed for wheezing or shortness of breath. Billey Co, MD Taking Active Self, Pharmacy Records  amiodarone (PACERONE) 200 MG tablet [id number] Yes Take 1 tablet (200 mg total) by mouth daily. Lewanda Rife, MD Taking Active Self, Pharmacy Records  amLODipine [facility]) 2.5 MG tablet [id number] Yes Take 1 tablet (2.5 mg total) by mouth daily. Marinda Elk, MD Taking Active   apixaban (ELIQUIS) 5 MG TABS tablet [id number] Yes Take 1 tablet (5 mg total) by mouth 2 (two) times daily. Lewanda Rife, MD Taking Active Self, Pharmacy Records  busPIRone (BUSPAR) 5 MG tablet [id number] Yes Take 5 mg by mouth 2 (two) times daily. [provider] Taking Active Self, Pharmacy Records  cholecalciferol (VITAMIN D3) 25 MCG (1000 UNIT) tablet [id number] Yes Take 1,000 Units by mouth daily. [provider] Taking Active Self, Pharmacy Records  clonazePAM (KLONOPIN) 0.5 MG tablet [id number] Yes Take 1 tablet (0.5 mg total) by mouth 2 (two) times daily as needed for anxiety. Ok to fill 12/29/22 Billey Co, MD Taking Active Self, Pharmacy Records  denosumab [facility]) 60 MG/ML SOSY injection [id number] Yes Inject 60 mg into the skin every 6 (six) months. Billey Co, MD Taking Active Self, Pharmacy Records  doxycycline (VIBRA-TABS) 100 MG tablet [id number] Yes Take 1 tablet (100 mg total) by mouth every 12 (twelve) hours for 5 days. Marinda Elk, MD Taking Active   escitalopram (LEXAPRO) 20 MG tablet [id number] Yes Take 1 tablet (20 mg total) by mouth daily. Billey Co, MD Taking Active Self, Pharmacy Records  ferrous sulfate 325 (65 FE) MG tablet [id number] Yes Take 1 tablet (325 mg total) by mouth every other day. Lewanda Rife, MD Taking Active Self, Pharmacy Records  Fluticasone-Umeclidin-Vilant [facility] ELLIPTA) 100-62.5-25 MCG/ACT AEPB [id number] Yes Inhale 1 puff into the lungs daily. Billey Co, MD Taking Active Self, Pharmacy Records           Med Note Cyndie Chime, [facility]   Sat Mar 12, 2023 11:27 PM)    folic acid (FOLVITE) 1 MG tablet [id number] Yes Take 1 tablet (1 mg total) by mouth daily.  Marinda Elk, MD Taking Active   gabapentin (NEURONTIN) 400 MG capsule [id number] Yes Take 2 capsules (800 mg total) by mouth 3 (three) times daily. Westley Chandler, MD Taking Active Self, Pharmacy Records     Discontinued 10/29/22 1104   lidocaine 4 % [id number] Yes Place 1 patch onto the skin daily.  Patient taking differently: Place 1 patch onto the skin daily as needed.   Billey Co, MD Taking Active Self, Pharmacy Records  loperamide (IMODIUM) 2 MG capsule [id number] Yes Take 1 capsule (2 mg total) by mouth 4 (four) times daily as needed for diarrhea or loose stools. Carroll Sage, PA-C Taking Active Self, Pharmacy Records  Multiple Vitamins-Minerals (CENTRUM ADULT PO) [id number] Yes Take 1 tablet by mouth daily. [provider] Taking Active Self, Pharmacy Records  ondansetron (ZOFRAN) 4 MG tablet [id number] Yes Take 1 tablet (4 mg total) by mouth every 6 (six) hours. Carroll Sage, PA-C Taking Active Self, Pharmacy Records  OXcarbazepine (TRILEPTAL) 150 MG tablet [id number] Yes Take 150 mg by mouth 2 (two) times daily. [provider] Taking Active Self, Pharmacy Records  pantoprazole (PROTONIX) 40 MG tablet [id number] Yes Take 1 tablet (40 mg total) by mouth daily. Westley Chandler, MD Taking Active Self, Pharmacy Records  traZODone (DESYREL) 50 MG tablet [id number] Yes Take 1 tablet (50 mg total) by mouth at bedtime as needed for sleep.  Patient taking differently: Take 50 mg by mouth at bedtime.   Lewanda Rife, MD Taking Active Self, Pharmacy Records            Home Care and Equipment/Supplies: Were Home Health Services Ordered?: No Any new equipment or medical supplies ordered?: No  Functional Questionnaire: Do you need assistance with bathing/showering or dressing?: No Do you need assistance with meal preparation?: No Do you need assistance with eating?: No Do you have difficulty maintaining continence: No Do you need assistance with getting out of bed/getting out of a chair/moving?: No Do you have difficulty managing or taking your medications?: No  Follow up appointments reviewed: PCP Follow-up appointment confirmed?: Yes Date of PCP follow-up  appointment?: 04/15/23 Follow-up Provider: Dr. Miquel Dunn Specialist [facility] Follow-up appointment confirmed?: Yes Date of Specialist follow-up appointment?: 04/19/23 Follow-Up Specialty Provider:: Ames Dura Do you need transportation to your follow-up appointment?: No Do you understand care options if your condition(s) worsen?: Yes-patient verbalized understanding  SDOH Interventions Today    Flowsheet Row Most Recent Value  SDOH Interventions   Food Insecurity Interventions Intervention Not Indicated  Housing Interventions Intervention Not Indicated  Utilities Interventions Intervention Not Indicated     Jodelle Gross RN, BSN, CCM RN Care Manager  Transitions of Care  [facility] - [facility]  [phone]

## 2023-04-14 ENCOUNTER — Ambulatory Visit: Payer: Medicare PPO | Admitting: Licensed Clinical Social Worker

## 2023-04-14 DIAGNOSIS — F332 Major depressive disorder, recurrent severe without psychotic features: Secondary | ICD-10-CM | POA: Diagnosis not present

## 2023-04-14 NOTE — Progress Notes (Signed)
[facility] Counselor/Therapist Progress Note  Patient ID: Karen Casey, MRN: [id number]    Date: 04/14/23  Time Spent: 1102  am - 1157 pm : 55 Minutes  Treatment Type: Individual Therapy.  Reported Symptoms: Depression   Mental Status Exam: Appearance:  Casual     Behavior: Sharing  Motor: Normal  Speech/Language:  Pressured and Slurred  Affect: Appropriate  Mood: anxious  Thought process: flight of ideas  Thought content:   WNL  Sensory/Perceptual disturbances:   WNL  Orientation: oriented to person, place, time/date, situation, day of week, month of year, and year  Attention: Good  Concentration: Good  Memory: WNL  Fund of knowledge:  Good  Insight:   Good  Judgment:  Good  Impulse Control: Good   Risk Assessment: Danger to Self:  No Self-injurious Behavior: No Danger to Others: No Duty to Warn:no Physical Aggression / Violence:No  Access to Firearms a concern: No  Gang Involvement:No   Subjective:   Karen Casey participated from home, via video, and consented to treatment. Therapist participated from office. We met online due to patient preference.   Karen Casey presented for her session and was engaged in discussion. Patient was very talkative and very informative about her being evicted from her current residence. Patient states that she was smoking in her room and gave her house code to a friend. Patient was cursing as she identified her arguments with the other ladies in her home. Patient reports that she is moving into her daughters home. Patient exhibited frustration and anger as she discussed her sister and her roommates. Clinician provided support through active listening. Patient lost connection prior to the end of session and Clinician was unable to reconnect via video or phone.   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   Plan: Karen Casey is  to use CBT, mindfulness and coping skills to help manage decrease symptoms associated with their diagnosis.   Long-term goal:   Karen Casey will reduce overall level, frequency, and intensity of the feelings of depression,evidenced by decreased irritability, negative self talk, and helpless feelings from 6 to 7 days/week to 0 to 2 days/week per client report for at least 3 consecutive months. Treatment plan to be reviewed by 01/27/2024.  Short-term goal:  Karen Casey will verbally express understanding of the relationship between feelings of depression and their impact on thinking patterns and behaviors. Verbalize an understanding of the role that distorted thinking plays in creating fears, excessive worry, and ruminations.  Karen Casey MSW, LCSW DATE: 04/14/2023

## 2023-04-14 NOTE — Progress Notes (Signed)
[facility] Telemedicine Visit  Patient consented to have virtual visit and was identified by name and date of birth. Pr with transportation issues and requested switch to telephone.  Method of visit: Telephone  Encounter participants: Patient: Karen Casey - located at home Provider: Billey Co - located at [facility]   Chief Complaint: hospital follow up  HPI:  Hospital follow up for cellulitis- legs feel like the redness has improved. NO fevers or chills. Still notes no appetite. Finished her antibiotics from the hospital. Gave her diarrhea. Feels like they made her cheeks red but better now that done.   Diarrhea- still having multiple loose stools 4x a day. No blood, just watery. Still not eating well. Notes not a strong appetite to eat. She notes she can get up and walk around without weakness.   Depression- Having to move in with her daughter [name] and leave where she lives and is nervous about that. Not talking with her sister right now. She notes she is getting evicted from her current residence due to sharing room code with a neighbor. Has been talking to her counselor, discussed yesterday. Notes feeling lonely. Still sees her psychiatrist, sees on 04/28/23. Having a hard time with motivation to do things she needs. Feels like she doesn't eat because she is depressed. Denies SI/HI. She notes "I would never do that to myself." She notes her grand babies are protective factors and wanting to be around them.   Alcohol use disorder- Since hospital discharge, having two beers per day. Does not see this as contributing to her diarrhea or worsened mood.  HTN- unsure if taking amlodipine for BP since hospital discharge.   ROS: per HPI  Pertinent PMHx: alcohol use disorder, hypokalemia, hypomagnesemia  Exam:   Respiratory: speaking in complete sentences  Assessment/Plan:  Assessment & Plan Hypokalemia She will come in next week for repeat BMP, denies  symptoms of weakness, having less loose stools Hypomagnesemia She will come in next week for repeat Mg, denies symptoms of weakness, having less loose stools Depression with anxiety On Lexapro, buspar, Klonopin, trazodone, and follows with psychiatry Denies SI/HI, able to safety plan, following with a counselor and finds this helpful Discussed thankful things/positives to changes her in living environment and she notes time with her grandkids as protective factors Cellulitis of left lower extremity Unable to visualize as pt only able to do phone encounter, discussed coming in sooner and only wants to see me so appt made for Wed Nov 13th She notes erythema improved, no fevers or chills  Discussed return precautions.  Diarrhea, unspecified type Improving since hospital and since off of antibiotics, discussed she can take immodium as GIPP was negative for acute infection  NO fevers, chills, blood in stools Alcohol use disorder Remains precontemplative, continue to encourage abstinence With folate deficiency, continue PO folate from hospital    Time spent during visit with patient: 26 minutes  Burley Saver MD [facility]

## 2023-04-15 ENCOUNTER — Ambulatory Visit: Payer: Medicare PPO | Admitting: Family Medicine

## 2023-04-15 DIAGNOSIS — L03116 Cellulitis of left lower limb: Secondary | ICD-10-CM

## 2023-04-15 DIAGNOSIS — E876 Hypokalemia: Secondary | ICD-10-CM | POA: Diagnosis not present

## 2023-04-15 DIAGNOSIS — F418 Other specified anxiety disorders: Secondary | ICD-10-CM | POA: Diagnosis not present

## 2023-04-15 DIAGNOSIS — F1090 Alcohol use, unspecified, uncomplicated: Secondary | ICD-10-CM

## 2023-04-15 DIAGNOSIS — R197 Diarrhea, unspecified: Secondary | ICD-10-CM | POA: Diagnosis not present

## 2023-04-15 NOTE — Assessment & Plan Note (Signed)
On Lexapro, buspar, Klonopin, trazodone, and follows with psychiatry Denies SI/HI, able to safety plan, following with a counselor and finds this helpful Discussed thankful things/positives to changes her in living environment and she notes time with her grandkids as protective factors

## 2023-04-15 NOTE — Assessment & Plan Note (Signed)
She will come in next week for repeat Mg, denies symptoms of weakness, having less loose stools

## 2023-04-15 NOTE — Assessment & Plan Note (Signed)
She will come in next week for repeat BMP, denies symptoms of weakness, having less loose stools

## 2023-04-15 NOTE — Assessment & Plan Note (Signed)
Unable to visualize as pt only able to do phone encounter, discussed coming in sooner and only wants to see me so appt made for Wed Nov 13th She notes erythema improved, no fevers or chills  Discussed return precautions.

## 2023-04-18 ENCOUNTER — Other Ambulatory Visit: Payer: Self-pay | Admitting: Primary Care

## 2023-04-18 ENCOUNTER — Ambulatory Visit: Payer: Medicare PPO

## 2023-04-18 DIAGNOSIS — J90 Pleural effusion, not elsewhere classified: Secondary | ICD-10-CM

## 2023-04-18 NOTE — Progress Notes (Deleted)
@Patient  ID: Karen Casey, female    DOB: 1955-12-17, 67 y.o.   MRN: [id number]  No chief complaint on file.   Referring provider: Billey Co, MD  HPI: 67 year old female, everyday smoker.  Past medical history significant for retention, A-fib, COPD, right pleural effusion, pulmonary nodules, SIADH, osteoporosis, tobacco abuse.  Previous [facility] pulmonary encounter: Pleural effusion on right Hospitalization for recent right community-acquired pneumonia, suspected pneumococcal pneumonia with associated pleural effusion.  Lymphocyte predominant, protein ratio 0.53.  We will repeat her chest x-ray now to ensure no reaccumulation.  If so then we can consider repeat thoracentesis.   Multifocal pneumonia Following clinically for improvement.  She is better.  Will repeat her chest x-ray today.  Did discuss with her that it will probably be a few months before she is fully over this.   COPD (chronic obstructive pulmonary disease) (HCC) Plan to continue Trelegy, albuterol as needed   Tobacco use Discussed cessation with her today.  She still smoking 10 cigarettes daily, does not feel that she can stop at this time.  We talked about decreasing slowly as an interim goal.  Will get her back to the lung cancer screening program going forward.  04/19/2023 Presents today for a follow-up visit due to COPD, pleural effusion.  Patient was hospitalized for right community-acquired pneumonia, suspected pneumococcal pneumonia with associated pleural effusion.  X-ray on 03/13/2023 showed small right pleural effusion with associated atelectasis.  Repeat imaging on 04/05/2023 showed no pleural effusion. Effusion is not large enough to be drained.   Continue Trelegy Ellipta Needs to work on smoking cessation    Allergies  Allergen Reactions   Capsaicin-Menthol Rash and Other (See Comments)    Burning and peeling on area applied to   Diclo Gel [Diclofenac Sodium] Rash and Other (See Comments)     Burning and peeling at the sight of application.    Immunization History  Administered Date(s) Administered   Influenza Split 06/22/2012, 03/29/2013, 08/23/2017   Influenza, Seasonal, Injecte, Preservative Fre 03/04/2023   Influenza,inj,Quad PF,6+ Mos 03/29/2013, 08/23/2017, 06/20/2020, 02/27/2021, 03/04/2022   PFIZER(Purple Top)SARS-COV-2 Vaccination 11/23/2019, 12/17/2019, 06/19/2020   PNEUMOCOCCAL CONJUGATE-20 02/27/2021   Pfizer Covid-19 Vaccine Bivalent Booster 31yrs & up 03/24/2021   Tdap 04/17/2021    Past Medical History:  Diagnosis Date   Alcohol abuse    last use 03/09/21, marijuana last 03/09/21   Allergy    Anxiety    Cataract 06/09/2012   Right eye and left eye   COPD (chronic obstructive pulmonary disease) (HCC)    Depression    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/04/2020   Due to benzodiazepine withdrawal   Dyspnea    uses oxygen 2L via [place] prn   Enterococcal bacteremia 02/10/2022   GERD (gastroesophageal reflux disease)    Headache    Hypertension    Long term (current) use of anticoagulants    Nausea vomiting and diarrhea 02/09/2022   Neuromuscular disorder (HCC)    Neuropathy    Paroxysmal atrial fibrillation (HCC)    Positive blood culture 02/10/2022   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   Seizure (HCC)    08/2020 per patient   Seizures (HCC)    xanax withdrawl- December 2013   Urinary incontinence 06/09/2012    Tobacco History: Social History   Tobacco Use  Smoking Status Every Day   Current packs/day: 0.50   Average packs/day: 0.5 packs/day for 43.0 years (21.5 ttl pk-yrs)   Types: Cigarettes  Passive exposure: Current  Smokeless Tobacco Never  Tobacco Comments   Tobacco info given   Ready to quit: Not Answered Counseling given: Not Answered Tobacco comments: Tobacco info given   Outpatient Medications Prior to Visit  Medication Sig Dispense Refill   albuterol (VENTOLIN HFA) 108 (90 Base) MCG/ACT  inhaler Inhale 2 puffs into the lungs every 6 (six) hours as needed for wheezing or shortness of breath. 1 each 2   amiodarone (PACERONE) 200 MG tablet Take 1 tablet (200 mg total) by mouth daily. 30 tablet 0   amLODipine (NORVASC) 2.5 MG tablet Take 1 tablet (2.5 mg total) by mouth daily. 30 tablet 0   apixaban (ELIQUIS) 5 MG TABS tablet Take 1 tablet (5 mg total) by mouth 2 (two) times daily. 60 tablet 0   busPIRone (BUSPAR) 5 MG tablet Take 5 mg by mouth 2 (two) times daily.     cholecalciferol (VITAMIN D3) 25 MCG (1000 UNIT) tablet Take 1,000 Units by mouth daily.     clonazePAM (KLONOPIN) 0.5 MG tablet Take 1 tablet (0.5 mg total) by mouth 2 (two) times daily as needed for anxiety. Ok to fill 12/29/22 16 tablet 0   denosumab (PROLIA) 60 MG/ML SOSY injection Inject 60 mg into the skin every 6 (six) months.     escitalopram (LEXAPRO) 20 MG tablet Take 1 tablet (20 mg total) by mouth daily.     ferrous sulfate 325 (65 FE) MG tablet Take 1 tablet (325 mg total) by mouth every other day. 60 tablet 1   Fluticasone-Umeclidin-Vilant (TRELEGY ELLIPTA) 100-62.5-25 MCG/ACT AEPB Inhale 1 puff into the lungs daily. 28 each 1   folic acid (FOLVITE) 1 MG tablet Take 1 tablet (1 mg total) by mouth daily. (Patient not taking: Reported on 04/15/2023) 30 tablet 0   gabapentin (NEURONTIN) 400 MG capsule Take 2 capsules (800 mg total) by mouth 3 (three) times daily. 180 capsule 0   lidocaine 4 % Place 1 patch onto the skin daily. (Patient not taking: Reported on 04/15/2023) 8 patch 1   loperamide (IMODIUM) 2 MG capsule Take 1 capsule (2 mg total) by mouth 4 (four) times daily as needed for diarrhea or loose stools. (Patient not taking: Reported on 04/15/2023) 12 capsule 0   Multiple Vitamins-Minerals (CENTRUM ADULT PO) Take 1 tablet by mouth daily.     ondansetron (ZOFRAN) 4 MG tablet Take 1 tablet (4 mg total) by mouth every 6 (six) hours. 12 tablet 0   OXcarbazepine (TRILEPTAL) 150 MG tablet Take 150 mg by mouth 2  (two) times daily.     pantoprazole (PROTONIX) 40 MG tablet Take 1 tablet (40 mg total) by mouth daily. 30 tablet 0   traZODone (DESYREL) 50 MG tablet Take 1 tablet (50 mg total) by mouth at bedtime as needed for sleep. (Patient taking differently: Take 50 mg by mouth at bedtime.) 30 tablet 0   No facility-administered medications prior to visit.      Review of Systems  Review of Systems   Physical Exam  There were no vitals taken for this visit. Physical Exam   Lab Results:  CBC    Component Value Date/Time   WBC 3.7 (L) 04/06/2023 0521   RBC 3.41 (L) 04/06/2023 0521   HGB 9.9 (L) 04/06/2023 0521   HGB 11.5 03/04/2023 1439   HCT 30.8 (L) 04/06/2023 0521   HCT 36.0 03/04/2023 1439   PLT 189 04/06/2023 0521   PLT 290 03/04/2023 1439   MCV 90.3 04/06/2023 0521  MCV 87 03/04/2023 1439   MCH 29.0 04/06/2023 0521   MCHC 32.1 04/06/2023 0521   RDW 18.0 (H) 04/06/2023 0521   RDW 17.6 (H) 03/04/2023 1439   LYMPHSABS 0.3 (L) 04/06/2023 0521   LYMPHSABS 1.3 10/29/2022 1501   MONOABS 0.1 04/06/2023 0521   EOSABS 0.0 04/06/2023 0521   EOSABS 0.1 10/29/2022 1501   BASOSABS 0.0 04/06/2023 0521   BASOSABS 0.0 10/29/2022 1501    BMET    Component Value Date/Time   NA 135 04/08/2023 0346   NA 130 (L) 03/04/2023 1439   K 4.4 04/08/2023 0346   CL 106 04/08/2023 0346   CO2 23 04/08/2023 0346   GLUCOSE 97 04/08/2023 0346   BUN 6 (L) 04/08/2023 0346   BUN 6 (L) 03/04/2023 1439   CREATININE 0.62 04/08/2023 0346   CREATININE 0.77 01/06/2016 1210   CALCIUM 7.4 (L) 04/08/2023 0346   GFRNONAA >60 04/08/2023 0346   GFRAA 104 06/20/2020 1431    BNP    Component Value Date/Time   BNP 150.0 (H) 04/05/2023 2330    ProBNP No results found for: "PROBNP"  Imaging: DG Tibia/Fibula Left  Result Date: 04/06/2023 CLINICAL DATA:  144615 Pain 144615 EXAM: LEFT TIBIA AND FIBULA - 2 VIEW COMPARISON:  None Available. FINDINGS: There is no evidence of fracture or other focal bone  lesions. Soft tissues are unremarkable. IMPRESSION: Negative. Electronically Signed   By: Tish Frederickson M.D.   On: 04/06/2023 01:45   DG Ankle Complete Left  Result Date: 04/06/2023 CLINICAL DATA:  leg swelling/pain, cellulitis EXAM: LEFT ANKLE COMPLETE - 3+ VIEW COMPARISON:  X-ray left ankle 08/07/2021 FINDINGS: There is no evidence of fracture, dislocation, or joint effusion. There is no evidence of arthropathy or other focal bone abnormality. Soft tissues are unremarkable. IMPRESSION: No acute displaced fracture or dislocation. Electronically Signed   By: Tish Frederickson M.D.   On: 04/06/2023 01:35   DG Chest Portable 1 View  Result Date: 04/06/2023 CLINICAL DATA:  shortness of breath.  Swelling/ pain and cellulitis EXAM: PORTABLE CHEST 1 VIEW COMPARISON:  Chest x-ray 03/13/2023, CT chest 01/28/2023, chest x-ray 01/31/2023 FINDINGS: The heart and mediastinal contours are unchanged. Atherosclerotic plaque. No focal consolidation. No pulmonary edema. No pleural effusion. No pneumothorax. No acute osseous abnormality. IMPRESSION: 1. No active disease. 2.  Aortic Atherosclerosis (ICD10-I70.0). Electronically Signed   By: Tish Frederickson M.D.   On: 04/06/2023 01:31     Assessment & Plan:   No problem-specific Assessment & Plan notes found for this encounter.     Glenford Bayley, NP 04/18/2023

## 2023-04-19 ENCOUNTER — Ambulatory Visit: Payer: Medicare PPO | Admitting: Primary Care

## 2023-04-19 ENCOUNTER — Ambulatory Visit: Payer: Medicare PPO

## 2023-04-21 ENCOUNTER — Encounter: Payer: Self-pay | Admitting: Primary Care

## 2023-04-22 DIAGNOSIS — F1990 Other psychoactive substance use, unspecified, uncomplicated: Secondary | ICD-10-CM | POA: Diagnosis not present

## 2023-04-22 DIAGNOSIS — K2920 Alcoholic gastritis without bleeding: Secondary | ICD-10-CM | POA: Diagnosis not present

## 2023-04-22 DIAGNOSIS — Z79899 Other long term (current) drug therapy: Secondary | ICD-10-CM | POA: Diagnosis not present

## 2023-04-22 DIAGNOSIS — R110 Nausea: Secondary | ICD-10-CM | POA: Diagnosis not present

## 2023-04-22 DIAGNOSIS — F10929 Alcohol use, unspecified with intoxication, unspecified: Secondary | ICD-10-CM | POA: Diagnosis not present

## 2023-04-22 DIAGNOSIS — F1910 Other psychoactive substance abuse, uncomplicated: Secondary | ICD-10-CM | POA: Diagnosis not present

## 2023-04-25 ENCOUNTER — Telehealth: Payer: Self-pay

## 2023-04-25 NOTE — Telephone Encounter (Signed)
Patient calls nurse line in regards to Gabapentin.   She reports she moved last week and lost her medication bottle. She reports she has not had any Gabapentin since Wednesday 11/6.  She reports she called last week, however I do not see this noted in chart.   Advised will forward to PCP.

## 2023-04-26 ENCOUNTER — Other Ambulatory Visit: Payer: Self-pay | Admitting: Family Medicine

## 2023-04-26 ENCOUNTER — Encounter: Payer: Self-pay | Admitting: Primary Care

## 2023-04-26 DIAGNOSIS — G621 Alcoholic polyneuropathy: Secondary | ICD-10-CM

## 2023-04-26 MED ORDER — GABAPENTIN 400 MG PO CAPS: 800.0000 mg | ORAL_CAPSULE | Freq: Three times a day (TID) | ORAL | 0 refills | Status: DC

## 2023-04-26 NOTE — Telephone Encounter (Signed)
Patient updated.

## 2023-04-26 NOTE — Progress Notes (Signed)
Gabapentin refilled

## 2023-04-26 NOTE — Progress Notes (Deleted)
    SUBJECTIVE:   CHIEF COMPLAINT / HPI:   Hospital follow up- follow up for cellulitis and diarrhea.   PERTINENT  PMH / PSH: ***  OBJECTIVE:   There were no vitals taken for this visit.  ***  ASSESSMENT/PLAN:   Assessment & Plan      Billey Co, MD [facility]

## 2023-04-27 ENCOUNTER — Inpatient Hospital Stay: Payer: Medicare PPO | Admitting: Family Medicine

## 2023-05-02 ENCOUNTER — Other Ambulatory Visit: Payer: Self-pay | Admitting: Family Medicine

## 2023-05-02 DIAGNOSIS — G8929 Other chronic pain: Secondary | ICD-10-CM

## 2023-05-02 DIAGNOSIS — J438 Other emphysema: Secondary | ICD-10-CM

## 2023-05-02 NOTE — Telephone Encounter (Signed)
Covering for Dr. Miquel Dunn Will refill trelegy Will not refill tizanidine as not on her current medication list and she is on many sedating medications Latrelle Dodrill, MD

## 2023-05-03 ENCOUNTER — Ambulatory Visit: Payer: Medicare PPO | Admitting: Gastroenterology

## 2023-05-03 NOTE — Progress Notes (Deleted)
GI Progress Note  Chief Complaint: ***  Subjective  Prior history  Adelynne follows up for her diarrhea and dysphagia.  Clinical details outlined in my extensive November 2022 office note.  She had undergone upper endoscopy by Dr. Adela Lank during hospitalization shortly before that. MBS April 2022 (report on file) Normal inpatient ultrasound ruling out gallstones, normal a.m. cortisol level ruling out adrenal insufficiency. Stool studies for C. difficile, ova and parasites negative, fecal elastase normal.  (Alcohol abuse)  Dr. Meridee Score performed her outpatient colonoscopy on September 29.  Report indicates it was an extremely challenging procedure difficult scope passage due to redundant and tortuous left colon anatomy.  The cecum was reached, prep was good, reportedly 10 mm polyp removed, pathology showing tubular adenoma.  Additional colon biopsies negative for microscopic colitis.  Reported hemorrhoids as well.  Ongoing nausea in the setting of alcohol abuse with no relief from Phenergan or ondansetron.  I did not want to use metoclopramide out of concern for the CNS side effects given the alcohol use.  Normal gastric emptying study and fecal elastase 2023.  No-show for clinic appointment on 05/03/2023   Discussed the use of AI scribe software for clinical note transcription with the patient, who gave verbal consent to proceed.  History of Present Illness            ROS: Cardiovascular:  no chest pain Respiratory: no dyspnea  The patient's Past Medical, Family and Social History were reviewed and are on file in the EMR. Past Medical History:  Diagnosis Date   Alcohol abuse    last use 03/09/21, marijuana last 03/09/21   Allergy    Anxiety    Cataract 06/09/2012   Right eye and left eye   COPD (chronic obstructive pulmonary disease) (HCC)    Depression    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/04/2020   Due to benzodiazepine withdrawal    Dyspnea    uses oxygen 2L via [place] prn   Enterococcal bacteremia 02/10/2022   GERD (gastroesophageal reflux disease)    Headache    Hypertension    Long term (current) use of anticoagulants    Nausea vomiting and diarrhea 02/09/2022   Neuromuscular disorder (HCC)    Neuropathy    Paroxysmal atrial fibrillation (HCC)    Positive blood culture 02/10/2022   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 Miquel Dunn):  Hospital follow up for cellulitis- legs feel like the redness has improved. NO fevers or chills. Still notes no appetite. Finished her antibiotics from the hospital. Gave her diarrhea. Feels like they made her cheeks red but better now that done.  Diarrhea- still having multiple loose stools 4x a day. No blood, just watery. Still not eating well. Notes not a strong appetite to eat. She notes she can get up and walk around without weakness.   Depression- Having to move in with her daughter [name] and leave where she lives and is nervous about that. Not talking with her sister right now. She notes she is getting evicted from her current residence due to sharing room code with a neighbor. Has been talking to her counselor, discussed yesterday. Notes feeling lonely. Still sees her psychiatrist, sees on 04/28/23. Having a hard time with motivation to do things  she needs. Feels like she doesn't eat because she is depressed. Denies SI/HI. She notes "I would never do that to myself." She notes her grand babies are protective factors and wanting to be around them.   Alcohol use disorder- Since hospital discharge, having two beers per day. Does not see this as contributing to her diarrhea or worsened mood. HTN- unsure if taking amlodipine for BP since hospital discharge.   Past Surgical History:  Procedure  Laterality Date   APPENDECTOMY     BIOPSY  01/04/2021   Procedure: BIOPSY;  Surgeon: Benancio Deeds, MD;  Location: [facility] ENDOSCOPY;  Service: Gastroenterology;;   BIOPSY  03/12/2021   Procedure: BIOPSY;  Surgeon: Lemar Lofty., MD;  Location: [facility] ENDOSCOPY;  Service: Gastroenterology;;   CARDIOVERSION N/A 01/28/2022   Procedure: CARDIOVERSION;  Surgeon: Yates Decamp, MD;  Location: [facility] ENDOSCOPY;  Service: Cardiovascular;  Laterality: N/A;   CATARACT EXTRACTION  06/09/2012   Left eye   COLONOSCOPY WITH PROPOFOL N/A 03/12/2021   Procedure: COLONOSCOPY WITH PROPOFOL;  Surgeon: Lemar Lofty., MD;  Location: [facility] ENDOSCOPY;  Service: Gastroenterology;  Laterality: N/A;   ESOPHAGOGASTRODUODENOSCOPY (EGD) WITH PROPOFOL N/A 01/04/2021   Procedure: ESOPHAGOGASTRODUODENOSCOPY (EGD) WITH PROPOFOL;  Surgeon: Benancio Deeds, MD;  Location: [facility] ENDOSCOPY;  Service: Gastroenterology;  Laterality: N/A;   left shoulder dislocation  Sept 2011   POLYPECTOMY  03/12/2021   Procedure: POLYPECTOMY;  Surgeon: Mansouraty, Netty Starring., MD;  Location: [facility] ENDOSCOPY;  Service: Gastroenterology;;     Objective:  Med list reviewed  Current Outpatient Medications:    albuterol (VENTOLIN HFA) 108 (90 Base) MCG/ACT inhaler, Inhale 2 puffs into the lungs every 6 (six) hours as needed for wheezing or shortness of breath., Disp: 1 each, Rfl: 2   amiodarone (PACERONE) 200 MG tablet, Take 1 tablet (200 mg total) by mouth daily., Disp: 30 tablet, Rfl: 0   amLODipine (NORVASC) 2.5 MG tablet, Take 1 tablet (2.5 mg total) by mouth daily., Disp: 30 tablet, Rfl: 0   apixaban (ELIQUIS) 5 MG TABS tablet, Take 1 tablet (5 mg total) by mouth 2 (two) times daily., Disp: 60 tablet, Rfl: 0   busPIRone (BUSPAR) 5 MG tablet, Take 5 mg by mouth 2 (two) times daily., Disp: , Rfl:    cholecalciferol (VITAMIN D3) 25 MCG (1000 UNIT) tablet, Take 1,000 Units by mouth daily., Disp: , Rfl:    clonazePAM (KLONOPIN) 0.5 MG tablet,  Take 1 tablet (0.5 mg total) by mouth 2 (two) times daily as needed for anxiety. Ok to fill 12/29/22, Disp: 16 tablet, Rfl: 0   denosumab (PROLIA) 60 MG/ML SOSY injection, Inject 60 mg into the skin every 6 (six) months., Disp: , Rfl:    escitalopram (LEXAPRO) 20 MG tablet, Take 1 tablet (20 mg total) by mouth daily., Disp: , Rfl:    ferrous sulfate 325 (65 FE) MG tablet, Take 1 tablet (325 mg total) by mouth every other day., Disp: 60 tablet, Rfl: 1   folic acid (FOLVITE) 1 MG tablet, Take 1 tablet (1 mg total) by mouth daily. (Patient not taking: Reported on 04/15/2023), Disp: 30 tablet, Rfl: 0   gabapentin (NEURONTIN) 400 MG capsule, Take 2 capsules (800 mg total) by mouth 3 (three) times daily., Disp: 180 capsule, Rfl: 0   lidocaine 4 %, Place 1 patch onto the skin daily. (Patient not taking: Reported on 04/15/2023), Disp: 8 patch, Rfl: 1   loperamide (IMODIUM) 2 MG capsule, Take 1 capsule (2 mg total) by mouth 4 (four)  times daily as needed for diarrhea or loose stools. (Patient not taking: Reported on 04/15/2023), Disp: 12 capsule, Rfl: 0   Multiple Vitamins-Minerals (CENTRUM ADULT PO), Take 1 tablet by mouth daily., Disp: , Rfl:    ondansetron (ZOFRAN) 4 MG tablet, Take 1 tablet (4 mg total) by mouth every 6 (six) hours., Disp: 12 tablet, Rfl: 0   OXcarbazepine (TRILEPTAL) 150 MG tablet, Take 150 mg by mouth 2 (two) times daily., Disp: , Rfl:    pantoprazole (PROTONIX) 40 MG tablet, Take 1 tablet (40 mg total) by mouth daily., Disp: 30 tablet, Rfl: 0   traZODone (DESYREL) 50 MG tablet, Take 1 tablet (50 mg total) by mouth at bedtime as needed for sleep. (Patient taking differently: Take 50 mg by mouth at bedtime.), Disp: 30 tablet, Rfl: 0   TRELEGY ELLIPTA 100-62.5-25 MCG/ACT AEPB, Inhale 1 puff by mouth once daily, Disp: 60 each, Rfl: 0   Vital signs in last 24 hrs: There were no vitals filed for this visit. Wt Readings from Last 3 Encounters:  04/05/23 144 lb (65.3 kg)  03/12/23 136 lb 14.4  oz (62.1 kg)  03/04/23 144 lb 3.2 oz (65.4 kg)    Physical Exam  *** HEENT: sclera anicteric, oral mucosa moist without lesions Neck: supple, no thyromegaly, JVD or lymphadenopathy Cardiac: ***,  no peripheral edema Pulm: clear to auscultation bilaterally, normal RR and effort noted Abdomen: soft, *** tenderness, with active bowel sounds. No guarding or palpable hepatosplenomegaly. Skin; warm and dry, no jaundice or rash   Labs:  Gastric emptying study and fecal elastase normal in September/October 2023 ___________________________________________ Radiologic studies:   ____________________________________________ Other:   _____________________________________________   No diagnosis found.  Assessment and Plan              Plan:   *** minutes were spent on this encounter (including chart review, history/exam, counseling/coordination of care, and documentation) > 50% of that time was spent on counseling and coordination of care.   Karen Casey

## 2023-05-06 ENCOUNTER — Ambulatory Visit (INDEPENDENT_AMBULATORY_CARE_PROVIDER_SITE_OTHER): Payer: Medicare PPO | Admitting: Licensed Clinical Social Worker

## 2023-05-06 DIAGNOSIS — F332 Major depressive disorder, recurrent severe without psychotic features: Secondary | ICD-10-CM | POA: Diagnosis not present

## 2023-05-06 NOTE — Progress Notes (Unsigned)
[place] [facility] Counselor/Therapist Progress Note  Patient ID: Karen Casey, MRN: [id number]    Date: 05/06/23  Time Spent: 1100  am - 1145 am : 45 Minutes  Treatment Type: Individual Therapy.  Reported Symptoms: depression, lack of interest and motivation, poor sleep, lack of positive support.  Mental Status Exam: Appearance:  Casual     Behavior: Blaming  Motor: Normal  Speech/Language:  Slow  Affect: Depressed  Mood: depressed  Thought process: normal  Thought content:   WNL  Sensory/Perceptual disturbances:   WNL  Orientation: oriented to person, place, time/date, situation, day of week, month of year, and year  Attention: Good  Concentration: Good  Memory: WNL  Fund of knowledge:  Good  Insight:   Good  Judgment:  Good  Impulse Control: Good   Risk Assessment: Danger to Self:  No Self-injurious Behavior: No Danger to Others: No Duty to Warn:no Physical Aggression / Violence:No  Access to Firearms a concern: No  Gang Involvement:No   Subjective:   Karen Casey participated from home, via video, and consented to treatment. Therapist participated from home office. We met online due to patient preference.   Karen Casey presented via video for her session. Karen Casey reports living with her daughter due to leaving her group living situation. Patient previously reported that she was being evicted but today patient denied this. Patient appeared lethargic and stated that she was just feeling tired due to not sleeping well. Patient stated that her depression has increased since moving to her daughters. She reports that she has plans to move into her own apartment once she gets paid. Patient reported that her depression began when she and her spouse lost their home and it increased once her husband passed.    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 Subjective report: Client stated feeling "hopeless" and experiencing significant fatigue, difficulty concentrating, and decreased interest in activities they previously enjoyed. Recent life events: Client reported a recent move to her daughters home as a potential stressor contributing to an increase in depressive symptoms. Previous treatment: Client has a history of recurrent depression, previously managed with therapy and medication.   Potential contributing factors: recent change in housing and moving in with her daughter, history of depression, possible genetic predisposition. Client demonstrated willingness to engage in therapy and discuss potential coping mechanisms.  Therapeutic interventions: Continue cognitive behavioral therapy (CBT) focusing on identifying and challenging negative thought patterns, developing coping skills, and implementing behavioral activation strategies. Medication review: Discuss with psychiatrist the possibility of adjusting current medication or exploring alternative options. Self-care education: Encourage regular exercise, healthy sleep hygiene, and stress management techniques. Follow-up: Schedule biweekly therapy sessions to monitor symptom progression and adjust interventions as needed. Treatment plan to be reviewed by 01/27/2024.  [name] MSW, LCSW DATE: 05/06/2023

## 2023-05-24 ENCOUNTER — Other Ambulatory Visit: Payer: Self-pay

## 2023-05-24 ENCOUNTER — Other Ambulatory Visit (HOSPITAL_COMMUNITY): Payer: Self-pay

## 2023-05-24 ENCOUNTER — Other Ambulatory Visit: Payer: Self-pay | Admitting: Family Medicine

## 2023-05-24 DIAGNOSIS — K219 Gastro-esophageal reflux disease without esophagitis: Secondary | ICD-10-CM

## 2023-05-24 DIAGNOSIS — G621 Alcoholic polyneuropathy: Secondary | ICD-10-CM

## 2023-05-24 MED ORDER — PANTOPRAZOLE SODIUM 40 MG PO TBEC: 40.0000 mg | DELAYED_RELEASE_TABLET | Freq: Every day | ORAL | 0 refills | Status: DC

## 2023-05-25 ENCOUNTER — Ambulatory Visit: Payer: Medicare PPO | Admitting: Licensed Clinical Social Worker

## 2023-06-16 ENCOUNTER — Ambulatory Visit: Payer: Medicare PPO | Admitting: Licensed Clinical Social Worker

## 2023-06-16 ENCOUNTER — Telehealth: Payer: Self-pay | Admitting: Family Medicine

## 2023-06-16 ENCOUNTER — Ambulatory Visit: Payer: Medicare PPO | Admitting: Emergency Medicine

## 2023-06-16 ENCOUNTER — Telehealth: Payer: Medicare PPO | Admitting: Family Medicine

## 2023-06-16 DIAGNOSIS — R112 Nausea with vomiting, unspecified: Secondary | ICD-10-CM

## 2023-06-16 DIAGNOSIS — R197 Diarrhea, unspecified: Secondary | ICD-10-CM

## 2023-06-16 DIAGNOSIS — F332 Major depressive disorder, recurrent severe without psychotic features: Secondary | ICD-10-CM

## 2023-06-16 DIAGNOSIS — D649 Anemia, unspecified: Secondary | ICD-10-CM

## 2023-06-16 NOTE — Telephone Encounter (Addendum)
 Called patient back as I was unable to get ahold of her earlier.  She notes she talked with her therapist later than expected.  Discussed her cough- has been going on 4-5 weeks but worse in the last 3 days, with increasing shortness of breath. No chest pain, no hemoptysis, no fevers, but some chills. She feels poorly. She notes she has been using her albuterol  inhaler 3-4 times per day as rescue.   On the phone, she is speaking in complete sentences without respiratory distress, but coughing occassionally and expiratory wheeze heard. She is unable to check her home SpO2.  I discussed my concern with her COPD, her recent pleural effusion, worsening coughing and shortness of breath that she could have more developing pneumonia versus a COPD exacerbation. Discussed that she needs to be evaluated and have O2 checked. She notes she cannot come to our clinic, I recommended ED evaluation. She notes she cannot drive herself, I offered 088 and she states she will get her daughter to take her tomorrow. I recommended evaluation tonight if possible and she was agreeable, discussed risk of severe infection, hypoxia, death if she isn't evaluated and treated and that it isn't possible for me to check her oxygen  levels and make sure she is safe over the phone. She is in agreement, alert and oriented, and states she will get her daughter to take her to the hospital tonight.  Rollene Keeling MD

## 2023-06-16 NOTE — Progress Notes (Signed)
 [place] [facility] Counselor/Therapist Progress Note  Patient ID: Karen Casey, MRN: [id number]    Date: 06/16/23  Time Spent: 1115  am - 1155 am : 45 Minutes  Treatment Type: Individual Therapy.  Reported Symptoms: Severe depression and feelings of hopelessness  Mental Status Exam: Appearance:  Disheveled     Behavior: Sharing  Motor: Normal  Speech/Language:  Clear and Coherent  Affect: Flat  Mood: depressed  Thought process: normal  Thought content:   WNL  Sensory/Perceptual disturbances:   WNL  Orientation: oriented to person, place, time/date, situation, day of week, month of year, and year  Attention: Good  Concentration: Good  Memory: WNL  Fund of knowledge:  Good  Insight:   Good  Judgment:  Good  Impulse Control: Good   Risk Assessment: Danger to Self:  No Self-injurious Behavior: No Danger to Others: No Duty to Warn:no Physical Aggression / Violence:No  Access to Firearms a concern: No  Gang Involvement:No   Subjective:   Karen Casey participated from home, via video/phone due to connectivity issues,  and consented to treatment acknowledging limitations and risks. Therapist participated from home office. We met online due to patient request.   Clinician and patient experienced difficulty with connectivity and eventually had to resort to phone session. Patient displayed a depressed mood. Patient reports that she is depressed and feels hopeless. She denied SI or HI but reports needing to get her own place. Patient reports that she pays 600 a month to live in her daughters garage. Patient reports that she cancelled her medical appointment this morning due to no one being willing to transport her. Clinician and patient processed what an ideal living situation would like for the patient.  Patient was wheezing as she was breathing and Clinician encouraged patient to meet with her PCP as soon possible. Patient was smoking a cigarette at the beginning of  the session and we discussed the risk of smoking and the negative effects on her overall health. Patient reported that she has no intention of quitting. Clinician actively listened as patient shared her frustration and voiced her thoughts about her family and the way they treat her.   Patients primary focus is on  improving overall quality of life by addressing symptoms like sadness, loss of interest, and reduced energy, which can include goals of increasing social engagement, practicing self-care, developing coping mechanisms, managing stress, setting realistic daily tasks, seeking professional support, and working towards a more positive outlook on life; all while aiming to achieve symptom reduction and functional improvement in daily activities.  Specific goal areas include: Emotional well-being: Reduce feelings of sadness and hopelessness.  Increase positive emotions and experiences.  Develop better emotional regulation skills.  Social functioning: Maintain meaningful social Science Writer in social activities regularly  Improve communication skills with loved ones  Physical health: Attempt to reduce/quit smoking Establish a regular exercise routine  Maintain a healthy diet  Get enough sleep  Cognitive functioning: Improve concentration and focus  Challenge negative thoughts and beliefs  Develop problem-solving skills  Lifestyle management: Set realistic daily goals  Prioritize self-care activities  Learn stress management techniques  Patient will continue with therapy biweekly and treatment plan will be reviewed by 01/27/2024.  Interventions: Cognitive Behavioral Therapy and Solution-Oriented/Positive Psychology  Diagnosis: Severe episode of recurrent major depressive disorder, without psychotic features.   Damien Junk MSW, LCSW/DATE 06/16/2023

## 2023-06-16 NOTE — Progress Notes (Signed)
    SUBJECTIVE:   [facility] Telemedicine Visit  Patient consented to have virtual visit and was identified by name and date of birth. Method of visit: Telephone  Encounter participants: Patient: Karen Casey - located at daughter's house Provider: Rollene FORBES Keeling - located at [facility]  Chief Complaint: check up- pt called and requested to be switched to virtual appt  HPI:  Depressed mood- doesn't feel like getting out of bed. Not caring about how she looks. Having poor appetite. Notes sometimes has thoughts of not wanting to be here, but notes she would not act on them. She denies plan or intent, talks to her psychiatrist and therapist about this. No previous attempts at self harm or suicide. Doesn't feel supported by family.   Afib- not taking Eliquis  and amiodarone  anymore, stopped taking months ago. Notes she just doesn't want to take them. Discussed risk of recurrent Afib and CVA and she notes she doesn't care about this.  COPD- having to use emergency inhaler more. Using Trelegy. Having congestion and coughing. Rhinorrhea. Not able to check oxygen  levels at home. One month of worsening coughing, clear phlegm. She has not been back to see the pulmonologist.   ROS: per HPI  Pertinent PMHx: pAfib, COPD  Exam:   Respiratory: speaking in complete sentences  Assessment/Plan:  Assessment & Plan   First call at 11:01 am lasted 10 min at first, then she had a therapy appt to go to at 11:10. Requests I call her back at noon. Was able to discuss safety planning and pt without plan or intent, feel benefit of attending therapy is important. Noted I would call her back at noon.   Called patient at 11:55 am, no answer left VM. Called patient at 12:02 am, no answer left VM.  Called patient at 12:10 am- went straight to VM. Left VM with clinic number to call back as we were unable to finish our visit. Will attempt to call this afternoon as able as I would like to  discuss more of her pulmonary status- I did note during our phone call that with her coughing she really needs to be seen in person.   Rollene FORBES Keeling, MD [facility]

## 2023-06-22 ENCOUNTER — Other Ambulatory Visit (HOSPITAL_COMMUNITY): Payer: Self-pay

## 2023-06-24 ENCOUNTER — Other Ambulatory Visit (HOSPITAL_COMMUNITY): Payer: Self-pay

## 2023-06-24 ENCOUNTER — Encounter (HOSPITAL_COMMUNITY): Payer: Self-pay

## 2023-06-27 ENCOUNTER — Other Ambulatory Visit: Payer: Self-pay

## 2023-06-30 ENCOUNTER — Ambulatory Visit (INDEPENDENT_AMBULATORY_CARE_PROVIDER_SITE_OTHER): Payer: Medicare PPO | Admitting: Licensed Clinical Social Worker

## 2023-06-30 DIAGNOSIS — F332 Major depressive disorder, recurrent severe without psychotic features: Secondary | ICD-10-CM

## 2023-06-30 NOTE — Progress Notes (Signed)
[facility] [place] [facility] Counselor/Therapist Progress Note  Patient ID: Karen Casey, MRN: [id number]    Date: 06/30/23  Time Spent: 1123  am - 1200 pm : 37 Minutes  Treatment Type: Individual Therapy.  Reported Symptoms: Depression  Mental Status Exam: Appearance:  Casual     Behavior: Appropriate  Motor: Normal  Speech/Language:  Clear and Coherent  Affect: Appropriate  Mood: normal  Thought process: normal  Thought content:   WNL  Sensory/Perceptual disturbances:   WNL  Orientation: oriented to person, place, time/date, situation, day of week, month of year, and year  Attention: Good  Concentration: Fair  Memory: WNL  Fund of knowledge:  Fair  Insight:   Poor  Judgment:  Fair  Impulse Control: Fair   Risk Assessment: Danger to Self:  No Self-injurious Behavior: No Danger to Others: No Duty to Warn:no Physical Aggression / Violence:No  Access to Firearms a concern: No  Gang Involvement:No   Subjective:   Karen Casey participated from home, via video, patient was aware of risks and limitations, and consented to treatment. Therapist participated from office. We met online due to patient request.  Patient presented for her session and was unable to work her device. Patient was contacted via phone and a voicemail was left. Clinician sent a chat message to assist patient with getting her screen to work. Patient eventually logged in and then connection was lost. We completed the call via phone. Patient was angry that her sister refused to sign a lease for her to get her own place. Patient was cursing and called her sister names as she vented her frustration. Patient states she doesn't get along with her daughter and doesn't have anyone she can rely on.   Clinician observed patients emotions of feeling hopeless. Clinician attempted to encourage patient to look at the situation from a different perspective of her sister. Patient identified that her sister has always  been there for her and helped her out of situations. Yet, patient lacked insight into her own behaviors and expectations, and how they are unreasonable. Patient was also unable to identify that she has lost multiple housing situations due to lack of payment and not following rules. Patient was only able to see her situation through her own lens of her need being met.  Patient will work on increasing awareness of her symptoms, promoting basic self-care practices, and gradually engaging in activities that can improve mood, with a strong emphasis on small, achievable steps and consistent support from  therapist and healthcare provider; potential goals include: identifying depressive symptoms, establishing a basic daily routine, engaging in minimal social interaction, practicing self-care activities like healthy eating and sleep hygiene, and gradually increasing participation in enjoyable activities; always prioritize safety and work collaboratively to build awareness and motivation for change. Patient will continue with biweekly therapy sessions. Treatment plan will be reviewed by 01/27/2024.  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     Karen Casey MSW, LCSW/DATE 06/30/2023

## 2023-07-01 ENCOUNTER — Other Ambulatory Visit: Payer: Self-pay

## 2023-07-01 ENCOUNTER — Other Ambulatory Visit: Payer: Self-pay | Admitting: Family Medicine

## 2023-07-01 DIAGNOSIS — K219 Gastro-esophageal reflux disease without esophagitis: Secondary | ICD-10-CM

## 2023-07-01 DIAGNOSIS — G621 Alcoholic polyneuropathy: Secondary | ICD-10-CM

## 2023-07-01 MED ORDER — PANTOPRAZOLE SODIUM 40 MG PO TBEC
40.0000 mg | DELAYED_RELEASE_TABLET | Freq: Every day | ORAL | 0 refills | Status: DC
Start: 2023-07-01 — End: 2023-07-29

## 2023-07-01 MED ORDER — GABAPENTIN 400 MG PO CAPS: 800.0000 mg | ORAL_CAPSULE | Freq: Three times a day (TID) | ORAL | 0 refills | Status: DC

## 2023-07-14 ENCOUNTER — Ambulatory Visit (INDEPENDENT_AMBULATORY_CARE_PROVIDER_SITE_OTHER): Payer: Medicare HMO | Admitting: Licensed Clinical Social Worker

## 2023-07-14 DIAGNOSIS — F332 Major depressive disorder, recurrent severe without psychotic features: Secondary | ICD-10-CM | POA: Diagnosis not present

## 2023-07-14 NOTE — Progress Notes (Signed)
[facility] Counselor/Therapist Progress Note  Patient ID: Karen Casey, MRN: [id number]    Date: 07/14/23  Time Spent: 1100  am - 1140 am : 40 Minutes  Treatment Type: Individual Therapy.  Reported Symptoms: Depression   Mental Status Exam: Appearance:  Casual     Behavior: Appropriate  Motor: Normal  Speech/Language:  Clear and Coherent  Affect: Appropriate  Mood: normal  Thought process: normal  Thought content:   WNL  Sensory/Perceptual disturbances:   WNL  Orientation: oriented to person, place, time/date, situation, day of week, month of year, and year  Attention: Good  Concentration: Fair  Memory: WNL  Fund of knowledge:  Fair  Insight:   Poor  Judgment:  Fair  Impulse Control: Fair    Risk Assessment: Danger to Self:  No Self-injurious Behavior: No Danger to Others: No Duty to Warn:no Physical Aggression / Violence:No  Access to Firearms a concern: No  Gang Involvement:No    Subjective:    Karen Casey participated from home, via video, patient was aware of risks and limitations, and consented to treatment. Therapist participated from office. We met online due to patient request.  Patient presented for her session appearing in better mood than previous sessions. Patient reports that she thinks she found a place of her own with a move in date of March 1st. Patient was optimistic about getting her own place and having her own space. Patient reports that she doesn't like staying at her daughters and feels getting out on her own will be better for everyone. Patient reports that she and her sister are no longer arguing and that they argue frequently. She states they never stay angry.   Patient lacks insight to recognize that she is the common denominator in the multiple conflicts she finds herself engaged in. Patient can be demanding of others and gets upset when they don't do what she ask when she ask. Clinician encouraged patient to be more  independent and find ways for her own transportation when possible and set a budget so she can better manage her finances. Clinician referenced patients depressive symptoms can be directly impacted by her financial situation and her housing.   Patient agreed that she will focus on managing her finances and ways she can be more in control of her life.Patient will work on increasing awareness of her symptoms, promoting basic self-care practices, and gradually engaging in activities that can improve mood, with a strong emphasis on small, achievable steps and consistent support from  therapist and healthcare provider; potential goals include: identifying depressive symptoms, establishing a basic daily routine, engaging in minimal social interaction, practicing self-care activities like healthy eating and sleep hygiene, and gradually increasing participation in enjoyable activities; always prioritize safety and work collaboratively to build awareness and motivation for change. Patient will continue with biweekly therapy sessions. Treatment plan will be reviewed by 01/27/2024.   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      [name] MSW, LCSW/DATE 07/14/2023

## 2023-07-18 ENCOUNTER — Ambulatory Visit: Payer: Medicare HMO | Admitting: Family Medicine

## 2023-07-25 ENCOUNTER — Other Ambulatory Visit: Payer: Self-pay | Admitting: Family Medicine

## 2023-07-25 DIAGNOSIS — J438 Other emphysema: Secondary | ICD-10-CM

## 2023-07-25 NOTE — Progress Notes (Deleted)
    SUBJECTIVE:   CHIEF COMPLAINT / HPI:   ***  PERTINENT  PMH / PSH: paroxysmal Afib, peripheral neuropathy, history of R pleural effusion with multifocal pneumonia, severe depression, tobacco use disorder, alcohol use disorder, COPD, hypokalemia, hypomagnesemia  OBJECTIVE:   There were no vitals taken for this visit.  General: A&O, NAD HEENT: No sign of trauma, EOM grossly intact Cardiac: RRR, no m/r/g Respiratory: CTAB, normal WOB, no w/c/r GI: Soft, NTTP, non-distended  Extremities: NTTP, no peripheral edema. Neuro: Normal gait, moves all four extremities appropriately. Psych: Appropriate mood and affect   ASSESSMENT/PLAN:   Assessment & Plan Hypokalemia  Hypomagnesemia      Billey Co, MD [facility]

## 2023-07-26 ENCOUNTER — Other Ambulatory Visit: Payer: Self-pay

## 2023-07-26 ENCOUNTER — Ambulatory Visit: Payer: Medicare HMO | Admitting: Family Medicine

## 2023-07-26 DIAGNOSIS — E876 Hypokalemia: Secondary | ICD-10-CM

## 2023-07-26 NOTE — Telephone Encounter (Signed)
Pt is asking for a refill on the nausea medicine ondansetron 4 mg tablets.  Clemencia Course, CMA

## 2023-07-28 ENCOUNTER — Ambulatory Visit: Payer: Medicare HMO | Admitting: Licensed Clinical Social Worker

## 2023-07-28 ENCOUNTER — Other Ambulatory Visit: Payer: Self-pay

## 2023-07-28 DIAGNOSIS — F332 Major depressive disorder, recurrent severe without psychotic features: Secondary | ICD-10-CM | POA: Diagnosis not present

## 2023-07-28 NOTE — Progress Notes (Signed)
[facility] Counselor/Therapist Progress Note  Patient ID: SHYE DOTY, MRN: [id number]    Date: 07/28/23  Time Spent: 0205  pm - 0250 pm : 45 Minutes  Treatment Type: Individual Therapy.  Reported Symptoms: Depression   Mental Status Exam: Appearance:  Casual     Behavior: Appropriate  Motor: Normal  Speech/Language:  Clear and Coherent  Affect: Appropriate  Mood: normal  Thought process: normal  Thought content:   WNL  Sensory/Perceptual disturbances:   WNL  Orientation: oriented to person, place, time/date, situation, day of week, month of year, and year  Attention: Good  Concentration: Fair  Memory: WNL  Fund of knowledge:  Fair  Insight:   Poor  Judgment:  Fair  Impulse Control: Fair    Risk Assessment: Danger to Self:  No Self-injurious Behavior: No Danger to Others: No Duty to Warn:no Physical Aggression / Violence:No  Access to Firearms a concern: No  Gang Involvement:No    Subjective:    [name] participated from home, via video, patient was aware of risks and limitations, and consented to treatment. Therapist participated from office. We met online due to patient request.  Leida presented for her session laying in her bed. Patient reports that she isn't going to be able to get her apartment. Patient was discouraged about this and shared that things at her daughters home is very stressful. She reports that her daughter found out her husband was cheating on her. She is concerned about the outcome because the house belongs to him. Although he doesn't work she pays the bills and works. She is concerned about what will happen to her and the children if he leaves her. Shritha stated she and her daughter discussed getting a place together but there are too many obstacles. Patient shared her frustration at her sister due to her not wanting to cosign for her an apartment. She stated she will just have to remain in the basement man cave of her  daughters until she can figure something else out.   Clinician provided validation of patients concerns via active listening and verbal interaction. Clinician encouraged patient to speak to [facility] and additional services that may assist her in seeking housing. Clinician also encouraged patient to do some searches on her phone and see if she can find something that may be within her price. Clinician also encouraged patient to save money and discussed ways to save money while she is living at her daughters home.  Patient agreed that she will focus on managing her finances and ways she can be more in control of her life.Patient will work on increasing awareness of her symptoms, promoting basic self-care practices, and gradually engaging in activities that can improve mood, with a strong emphasis on small, achievable steps and consistent support from  therapist and healthcare provider; potential goals include: identifying depressive symptoms, establishing a basic daily routine, engaging in minimal social interaction, practicing self-care activities like healthy eating and sleep hygiene, and gradually increasing participation in enjoyable activities; always prioritize safety and work collaboratively to build awareness and motivation for change. Patient will continue with biweekly therapy sessions. Treatment plan will be reviewed by 01/27/2024.   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    [name] MSW, LCSW/DATE

## 2023-07-28 NOTE — Telephone Encounter (Signed)
Patient calls nurse line requesting a refill on Zofran.   She reports she was planning on discussing a refill with patient at apt earlier this week, however the apt had to be rescheduled.   Will forward to PCP.

## 2023-07-29 ENCOUNTER — Ambulatory Visit: Payer: Medicare HMO | Admitting: Family Medicine

## 2023-07-29 ENCOUNTER — Other Ambulatory Visit: Payer: Self-pay | Admitting: Family Medicine

## 2023-07-29 DIAGNOSIS — K219 Gastro-esophageal reflux disease without esophagitis: Secondary | ICD-10-CM

## 2023-07-29 NOTE — Telephone Encounter (Signed)
Discussed with patient.   She prefers to wait until March apt with PCP.

## 2023-08-01 ENCOUNTER — Other Ambulatory Visit: Payer: Self-pay | Admitting: Family Medicine

## 2023-08-01 DIAGNOSIS — G621 Alcoholic polyneuropathy: Secondary | ICD-10-CM

## 2023-08-11 ENCOUNTER — Ambulatory Visit (INDEPENDENT_AMBULATORY_CARE_PROVIDER_SITE_OTHER): Payer: Medicare HMO | Admitting: Licensed Clinical Social Worker

## 2023-08-11 DIAGNOSIS — F332 Major depressive disorder, recurrent severe without psychotic features: Secondary | ICD-10-CM | POA: Diagnosis not present

## 2023-08-11 NOTE — Progress Notes (Signed)
 [facility] Counselor/Therapist Progress Note  Patient ID: Karen Casey, MRN: [id number]    Date: 08/11/23  Time Spent: 1100  am - 1150 am : 50 Minutes  Treatment Type: Individual Therapy.  Reported Symptoms: Depression   Mental Status Exam: Appearance:  Casual     Behavior: Appropriate  Motor: Normal  Speech/Language:  Clear and Coherent  Affect: Appropriate  Mood: normal  Thought process: normal  Thought content:   WNL  Sensory/Perceptual disturbances:   WNL  Orientation: oriented to person, place, time/date, situation, day of week, month of year, and year  Attention: Good  Concentration: Fair  Memory: WNL  Fund of knowledge:  Fair  Insight:   Poor  Judgment:  Fair  Impulse Control: Fair    Risk Assessment: Danger to Self:  No Self-injurious Behavior: No Danger to Others: No Duty to Warn:no Physical Aggression / Violence:No  Access to Firearms a concern: No  Gang Involvement:No    Subjective:    Karen Casey participated from home, via video, patient was aware of risks and limitations, and consented to treatment. Therapist participated from office. We met online due to patient request.  Karen Casey presented for her session reporting that she has been talking with someone about getting an apartment. She states that it looks as though it will work out and she is waiting to hear back today. Patient states she is ready to get her own place and not feel that she is a burden to her daughter. She also reports that her daughter and her daughters husband ask her for money. She reports that she is broke before her next payday each month. Patient was in good spirits as she discussed the idea of getting her own place. Karen Casey states she would enjoy getting outside and having a few plants to take care of and sitting on her porch. Patient states that her depression has increased being at her daughters and not really feeling she has her own space and that she is  confined to one area that she uses to sleep. Patient reports that she continues to smoke and lays in her bed most days. Patient acknowledged that she needs to move around more and not stay inside so much.  Clinician observed that patient was excited as she spoke of the possibility of getting her own place. Patient is insightful as she recognizes the negative effects on her mental health being in her daughters home. Clinician processed with patient the importance of not allowing herself to get behind on rent and to take care of her new place so that she doesn't repeat the same mistakes as before that caused her to move in with her daughter.   Patient reports that she will be more structured and motivated to care for her home and not having to share it with someone will make it easier to not have conflict and to care for. Patient states she hopes to be more motivated to get outside more and improve her health.  Patient will continue to focus on managing her finances and ways she can be more in control of her life.Patient will work on increasing awareness of her symptoms, promoting basic self-care practices, and gradually engaging in activities that can improve mood, with a strong emphasis on small, achievable steps and consistent support from  therapist and healthcare provider; potential goals include: identifying depressive symptoms, establishing a basic daily routine, engaging in minimal social interaction, practicing self-care activities like healthy eating and sleep hygiene, and gradually  increasing participation in enjoyable activities; always prioritize safety and work collaboratively to build awareness and motivation for change. Patient will continue with biweekly therapy sessions. Treatment plan will be reviewed by 01/27/2024.   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     Karen Casey MSW, LCSW/DATE 08/11/2023

## 2023-08-13 DIAGNOSIS — F331 Major depressive disorder, recurrent, moderate: Secondary | ICD-10-CM | POA: Insufficient documentation

## 2023-08-15 ENCOUNTER — Ambulatory Visit: Payer: Medicare HMO | Admitting: Family Medicine

## 2023-08-24 ENCOUNTER — Ambulatory Visit: Payer: Medicare HMO | Admitting: Licensed Clinical Social Worker

## 2023-08-24 ENCOUNTER — Telehealth: Payer: Self-pay | Admitting: Licensed Clinical Social Worker

## 2023-08-24 DIAGNOSIS — F332 Major depressive disorder, recurrent severe without psychotic features: Secondary | ICD-10-CM

## 2023-08-24 NOTE — Progress Notes (Deleted)
 [facility] Counselor/Therapist Progress Note  Patient ID: Karen Casey, MRN: [id number]    Date: 08/24/23  Time Spent: Treatment Type: Individual Therapy.  Reported Symptoms: ***  Mental Status Exam: Appearance:  {PSY:22683}     Behavior: {PSY:21022743}  Motor: {PSY:22302}  Speech/Language:  {PSY:22685}  Affect: {PSY:22687}  Mood: {PSY:31886}  Thought process: {PSY:31888}  Thought content:   {[phone]}  Sensory/Perceptual disturbances:   {PSY:[phone]}  Orientation: {PSY:30297}  Attention: {PSY:22877}  Concentration: {PSY:518 597 2481}  Memory: {PSY:([phone]}  Fund of knowledge:  {PSY:518 597 2481}  Insight:   {PSY:518 597 2481}  Judgment:  {PSY:518 597 2481}  Impulse Control: {PSY:518 597 2481}   Risk Assessment: Danger to Self:  {PSY:22692} Self-injurious Behavior: {PSY:22692} Danger to Others: {PSY:22692} Duty to Warn:{PSY:311194} Physical Aggression / Violence:{PSY:21197} Access to Firearms a concern: {PSY:21197} Gang Involvement:{PSY:21197}  Subjective:   [name] participated from {Patient Location:26691::"home"}, via {LBBHVIDEOORPHONE:26720}, and consented to treatment. Therapist participated from {LBBHPROVIDERLOCATION:26721}. We met online due to COVID pandemic.   ***   Interventions: {PSY:629 248 6776}  Diagnosis: No diagnosis found.   Plan: ***Patient is to use CBT, mindfulness and coping skills to help manage decrease symptoms associated with their diagnosis.   Long-term goal:   ***Reduce overall level, frequency, and intensity of the feelings of depression, anxiety and panic evidenced by       decreased irritability, negative self talk, and helpless feelings from 6 to 7 days/week to 0 to 1 days/week per client report for at least 3 consecutive months.  Short-term goal:  ***Verbally express understanding of the relationship between feelings of depression, anxiety and their impact on thinking patterns and behaviors. Verbalize  an understanding of the role that distorted thinking plays in creating fears, excessive worry, and ruminations.  [name] MSW, LCSW/DATE

## 2023-08-24 NOTE — Telephone Encounter (Signed)
 08/24/2023 12:28 PM  Clinician had a virtual session scheduled with patient at 10 am. Clinician logged on and patient wasn't there. Clinician remained in the waiting room while completing previous patient notes. Clinician would click back and forth to make sure patient had not entered the room. Clinician attempted to call the patient at 10:12 am. Patient did not answer and voicemail did not pick up the call. At around 10:20 Clinician clicked off the call due to patient not arriving. At approximately 10:30 Clinician notified admin team that patient was a no show and Clinician documented this in OUTLOOK. At approximately 10:45 I received a message from admin stating that the patient called and stated they had been waiting since 10 am.

## 2023-08-25 ENCOUNTER — Other Ambulatory Visit: Payer: Self-pay | Admitting: Family Medicine

## 2023-08-25 DIAGNOSIS — J438 Other emphysema: Secondary | ICD-10-CM

## 2023-08-31 ENCOUNTER — Ambulatory Visit: Admitting: Licensed Clinical Social Worker

## 2023-08-31 DIAGNOSIS — E162 Hypoglycemia, unspecified: Secondary | ICD-10-CM | POA: Diagnosis not present

## 2023-08-31 DIAGNOSIS — Z79899 Other long term (current) drug therapy: Secondary | ICD-10-CM | POA: Diagnosis not present

## 2023-08-31 DIAGNOSIS — F411 Generalized anxiety disorder: Secondary | ICD-10-CM | POA: Diagnosis not present

## 2023-08-31 DIAGNOSIS — F332 Major depressive disorder, recurrent severe without psychotic features: Secondary | ICD-10-CM | POA: Diagnosis not present

## 2023-08-31 DIAGNOSIS — R569 Unspecified convulsions: Secondary | ICD-10-CM | POA: Diagnosis not present

## 2023-08-31 DIAGNOSIS — E038 Other specified hypothyroidism: Secondary | ICD-10-CM | POA: Diagnosis not present

## 2023-08-31 DIAGNOSIS — E871 Hypo-osmolality and hyponatremia: Secondary | ICD-10-CM | POA: Diagnosis not present

## 2023-08-31 DIAGNOSIS — G629 Polyneuropathy, unspecified: Secondary | ICD-10-CM | POA: Diagnosis not present

## 2023-08-31 DIAGNOSIS — F1020 Alcohol dependence, uncomplicated: Secondary | ICD-10-CM | POA: Diagnosis not present

## 2023-08-31 DIAGNOSIS — K219 Gastro-esophageal reflux disease without esophagitis: Secondary | ICD-10-CM | POA: Diagnosis not present

## 2023-08-31 DIAGNOSIS — I10 Essential (primary) hypertension: Secondary | ICD-10-CM | POA: Diagnosis not present

## 2023-08-31 DIAGNOSIS — I499 Cardiac arrhythmia, unspecified: Secondary | ICD-10-CM | POA: Diagnosis not present

## 2023-08-31 DIAGNOSIS — R2689 Other abnormalities of gait and mobility: Secondary | ICD-10-CM | POA: Diagnosis not present

## 2023-08-31 DIAGNOSIS — F10239 Alcohol dependence with withdrawal, unspecified: Secondary | ICD-10-CM | POA: Diagnosis not present

## 2023-08-31 DIAGNOSIS — I482 Chronic atrial fibrillation, unspecified: Secondary | ICD-10-CM | POA: Diagnosis not present

## 2023-08-31 DIAGNOSIS — F331 Major depressive disorder, recurrent, moderate: Secondary | ICD-10-CM | POA: Diagnosis not present

## 2023-09-01 ENCOUNTER — Other Ambulatory Visit: Payer: Self-pay

## 2023-09-01 DIAGNOSIS — I499 Cardiac arrhythmia, unspecified: Secondary | ICD-10-CM | POA: Diagnosis not present

## 2023-09-01 DIAGNOSIS — F411 Generalized anxiety disorder: Secondary | ICD-10-CM | POA: Diagnosis not present

## 2023-09-01 DIAGNOSIS — G629 Polyneuropathy, unspecified: Secondary | ICD-10-CM | POA: Diagnosis not present

## 2023-09-01 DIAGNOSIS — E871 Hypo-osmolality and hyponatremia: Secondary | ICD-10-CM | POA: Diagnosis not present

## 2023-09-01 DIAGNOSIS — Z79899 Other long term (current) drug therapy: Secondary | ICD-10-CM | POA: Diagnosis not present

## 2023-09-01 DIAGNOSIS — K219 Gastro-esophageal reflux disease without esophagitis: Secondary | ICD-10-CM | POA: Diagnosis not present

## 2023-09-01 DIAGNOSIS — R2689 Other abnormalities of gait and mobility: Secondary | ICD-10-CM | POA: Diagnosis not present

## 2023-09-01 DIAGNOSIS — E038 Other specified hypothyroidism: Secondary | ICD-10-CM | POA: Diagnosis not present

## 2023-09-01 DIAGNOSIS — F1020 Alcohol dependence, uncomplicated: Secondary | ICD-10-CM | POA: Diagnosis not present

## 2023-09-01 DIAGNOSIS — F331 Major depressive disorder, recurrent, moderate: Secondary | ICD-10-CM | POA: Diagnosis not present

## 2023-09-02 DIAGNOSIS — F332 Major depressive disorder, recurrent severe without psychotic features: Secondary | ICD-10-CM | POA: Diagnosis not present

## 2023-09-02 DIAGNOSIS — F1020 Alcohol dependence, uncomplicated: Secondary | ICD-10-CM | POA: Diagnosis not present

## 2023-09-04 DIAGNOSIS — F1020 Alcohol dependence, uncomplicated: Secondary | ICD-10-CM | POA: Diagnosis not present

## 2023-09-04 DIAGNOSIS — F332 Major depressive disorder, recurrent severe without psychotic features: Secondary | ICD-10-CM | POA: Diagnosis not present

## 2023-09-05 DIAGNOSIS — G629 Polyneuropathy, unspecified: Secondary | ICD-10-CM | POA: Diagnosis not present

## 2023-09-05 DIAGNOSIS — R2689 Other abnormalities of gait and mobility: Secondary | ICD-10-CM | POA: Diagnosis not present

## 2023-09-05 DIAGNOSIS — Z79899 Other long term (current) drug therapy: Secondary | ICD-10-CM | POA: Diagnosis not present

## 2023-09-05 DIAGNOSIS — E038 Other specified hypothyroidism: Secondary | ICD-10-CM | POA: Diagnosis not present

## 2023-09-05 DIAGNOSIS — F1020 Alcohol dependence, uncomplicated: Secondary | ICD-10-CM | POA: Diagnosis not present

## 2023-09-05 DIAGNOSIS — F331 Major depressive disorder, recurrent, moderate: Secondary | ICD-10-CM | POA: Diagnosis not present

## 2023-09-05 DIAGNOSIS — F411 Generalized anxiety disorder: Secondary | ICD-10-CM | POA: Diagnosis not present

## 2023-09-05 DIAGNOSIS — K219 Gastro-esophageal reflux disease without esophagitis: Secondary | ICD-10-CM | POA: Diagnosis not present

## 2023-09-06 ENCOUNTER — Telehealth: Payer: Self-pay

## 2023-09-06 NOTE — Transitions of Care (Post Inpatient/ED Visit) (Signed)
   09/06/2023  Name: XITLALLY MOONEYHAM MRN: [id number] DOB: 05-02-1956  Today's TOC FU Call Status:    Attempted to reach the patient regarding the most recent Inpatient/ED visit.  Follow Up Plan: No further outreach attempts will be made at this time. We have been unable to contact the patient.  Signature [name], LPN [facility] Nurse Health Advisor Direct Dial [phone]

## 2023-09-08 ENCOUNTER — Ambulatory Visit: Admitting: Licensed Clinical Social Worker

## 2023-09-08 NOTE — Progress Notes (Unsigned)
 [facility] Counselor/Therapist Progress Note  Patient ID: Karen Casey, MRN: [id number]    Date: 09/08/23  Time Spent: ***  {LBBHAMPM:26719} - *** {LBBHAMPM:26719} : *** Minutes  Treatment Type: Individual Therapy.  Reported Symptoms: ***  Mental Status Exam: Appearance:  {PSY:22683}     Behavior: {PSY:21022743}  Motor: {PSY:22302}  Speech/Language:  {PSY:22685}  Affect: {PSY:22687}  Mood: {PSY:31886}  Thought process: {PSY:31888}  Thought content:   {PSY:4458700528}  Sensory/Perceptual disturbances:   {PSY:([phone]}  Orientation: {PSY:30297}  Attention: {PSY:22877}  Concentration: {PSY:(772)549-8592}  Memory: {PSY:8036814823}  Fund of knowledge:  {PSY:(772)549-8592}  Insight:   {PSY:(772)549-8592}  Judgment:  {PSY:(772)549-8592}  Impulse Control: {PSY:(772)549-8592}   Risk Assessment: Danger to Self:  {PSY:22692} Self-injurious Behavior: {PSY:22692} Danger to Others: {PSY:22692} Duty to Warn:{PSY:311194} Physical Aggression / Violence:{PSY:21197} Access to Firearms a concern: {PSY:21197} Gang Involvement:{PSY:21197}  Subjective:   Karen Casey participated from {Patient Location:26[address]::"home"}, via {LBBHVIDEOORPHONE:26720}, and consented to treatment. Therapist participated from {LBBHPROVIDERLOCATION:26721}. We met online due to COVID pandemic.   ***   Interventions: {PSY:([phone])325-5398}  Diagnosis: No diagnosis found.   Plan: ***Patient is to use CBT, mindfulness and coping skills to help manage decrease symptoms associated with their diagnosis.   Long-term goal:   ***Reduce overall level, frequency, and intensity of the feelings of depression, anxiety and panic evidenced by       decreased irritability, negative self talk, and helpless feelings from 6 to 7 days/week to 0 to 1 days/week per client report for at least 3 consecutive months.  Short-term goal:  ***Verbally express understanding of the relationship between feelings of depression, anxiety and  their impact on thinking patterns and behaviors. Verbalize an understanding of the role that distorted thinking plays in creating fears, excessive worry, and ruminations.  [name] MSW, LCSW/DATE

## 2023-09-14 ENCOUNTER — Other Ambulatory Visit: Payer: Self-pay

## 2023-09-15 ENCOUNTER — Other Ambulatory Visit: Payer: Self-pay

## 2023-09-15 NOTE — Progress Notes (Signed)
 Disenrolling - call center unable to reach patient to confirm if she will continue prolia or if they require from pharmacy.

## 2023-09-21 ENCOUNTER — Other Ambulatory Visit: Payer: Self-pay | Admitting: Family Medicine

## 2023-09-21 ENCOUNTER — Other Ambulatory Visit: Payer: Self-pay

## 2023-09-21 DIAGNOSIS — G621 Alcoholic polyneuropathy: Secondary | ICD-10-CM

## 2023-10-06 ENCOUNTER — Telehealth: Payer: Self-pay

## 2023-10-06 DIAGNOSIS — I4891 Unspecified atrial fibrillation: Secondary | ICD-10-CM

## 2023-10-06 NOTE — Telephone Encounter (Signed)
 Called patient.  Advised she would need an evaluation as discussed previously.   She reports she is feeling better and states she will go to the ED if necessary.   She will followup with PCP as scheduled.

## 2023-10-06 NOTE — Telephone Encounter (Signed)
 Patient calls nurse line requesting a refill on Amiodarone .  She reports she "believes" this is the medication she has been given in the past for Afib.  She reports she has been in Afib for ~ 3 days. She reports her only symptom is a rapid heartbeat.   She denies any chest pains, shortness of breath, dizziness or fatigue.   Patient advised a clinic visit. She reports "there is no way for me to get there."   Advised will forward to PCP.   ED precautions discussed with patient.   She requests this go to [facility] in [place]. I have pended this for you.

## 2023-10-14 ENCOUNTER — Ambulatory Visit: Admitting: Family Medicine

## 2023-10-17 ENCOUNTER — Telehealth (INDEPENDENT_AMBULATORY_CARE_PROVIDER_SITE_OTHER): Admitting: Family Medicine

## 2023-10-17 DIAGNOSIS — F419 Anxiety disorder, unspecified: Secondary | ICD-10-CM | POA: Diagnosis not present

## 2023-10-17 DIAGNOSIS — K219 Gastro-esophageal reflux disease without esophagitis: Secondary | ICD-10-CM | POA: Diagnosis not present

## 2023-10-17 DIAGNOSIS — I480 Paroxysmal atrial fibrillation: Secondary | ICD-10-CM | POA: Diagnosis not present

## 2023-10-17 DIAGNOSIS — Z5941 Food insecurity: Secondary | ICD-10-CM

## 2023-10-17 MED ORDER — PANTOPRAZOLE SODIUM 40 MG PO TBEC: 40.0000 mg | DELAYED_RELEASE_TABLET | Freq: Every day | ORAL | 3 refills | Status: DC

## 2023-10-17 MED ORDER — AMIODARONE HCL 200 MG PO TABS: 200.0000 mg | ORAL_TABLET | Freq: Every day | ORAL | 3 refills | Status: DC

## 2023-10-17 NOTE — Assessment & Plan Note (Signed)
 With depression, denies SI/HI, provided supportive listening, she is connected with psychiatry and therapy, continue home medications

## 2023-10-17 NOTE — Assessment & Plan Note (Signed)
 PPI refilled.

## 2023-10-17 NOTE — Assessment & Plan Note (Signed)
 Pt denies chest pain or dyspnea, speaking in complete sentences Will refill amiodarone - discussed risk of stroke with not taking her Eliquis  and she is aware and does not want to take it anymore Discussed impotance of follow up in person to assess her heart rate and blood pressure, appt made for 10/31/23 and she will discussed with her sister

## 2023-10-17 NOTE — Progress Notes (Signed)
 [facility] Telemedicine Visit  Patient consented to have virtual visit and was identified by name and date of birth. Method of visit: Telephone  Encounter participants: Patient: Karen Casey - located at daughter's house Provider: Charmel Cooter - located at [facility]  Chief Complaint: feeling down  HPI:  Depression- denies SI/HI. Notes living with her daughter conditions have been hard. Seeing psychiatrist and counselor virtually. Interested in home based primary care and resources for food/housing as it has been hard living with daughter. Still taking Klonopin , trazodone , lexapro  and this is helping.  Palpitations- sometimes feels her heart is racing. Denies chest pain or shortness of breath. Does not want to take the Eliquis  bc she does not like the easy bruising. Would like a refill on her amiodarone  which she stopped taking.   GERD- still taking pantoprazole  daily, notes needs a refill.  ROS: per HPI  Pertinent PMHx: Afib  Exam:  There were no vitals taken for this visit.  Respiratory: speaking in complete sentences  Assessment/Plan:  Assessment & Plan Food insecurity Referral to [facility], also discussed [facility] home based primary care services which she is interested in as she has limited transportation and has only been able to do virtual visits for the past few appts Gastroesophageal reflux disease without esophagitis PPI refilled Paroxysmal atrial fibrillation (HCC) Pt denies chest pain or dyspnea, speaking in complete sentences Will refill amiodarone - discussed risk of stroke with not taking her Eliquis  and she is aware and does not want to take it anymore Discussed impotance of follow up in person to assess her heart rate and blood pressure, appt made for 10/31/23 and she will discussed with her sister Anxiety With depression, denies SI/HI, provided supportive listening, she is connected with psychiatry and therapy, continue home  medications    Time spent during visit with patient: 21 minutes

## 2023-10-18 ENCOUNTER — Telehealth: Payer: Self-pay | Admitting: *Deleted

## 2023-10-18 ENCOUNTER — Other Ambulatory Visit: Payer: Self-pay

## 2023-10-18 DIAGNOSIS — G621 Alcoholic polyneuropathy: Secondary | ICD-10-CM

## 2023-10-18 MED ORDER — GABAPENTIN 400 MG PO CAPS
800.0000 mg | ORAL_CAPSULE | Freq: Three times a day (TID) | ORAL | 0 refills | Status: DC
Start: 2023-10-18 — End: 2023-12-27

## 2023-10-18 NOTE — Progress Notes (Signed)
 Complex Care Management Note Care Guide Note  10/18/2023 Name: [name] MRN: [id number] DOB: 04/22/1956   Complex Care Management Outreach Attempts: An unsuccessful telephone outreach was attempted today to offer the patient information about available complex care management services.  Follow Up Plan:  Additional outreach attempts will be made to offer the patient complex care management information and services.   Encounter Outcome:  No Answer  Barnie Bora  [facility]  [facility], [facility] Guide  Direct Dial: [phone]  Fax [phone]

## 2023-10-19 ENCOUNTER — Telehealth: Payer: Self-pay

## 2023-10-19 DIAGNOSIS — I480 Paroxysmal atrial fibrillation: Secondary | ICD-10-CM

## 2023-10-19 MED ORDER — AMIODARONE HCL 200 MG PO TABS: 200.0000 mg | ORAL_TABLET | Freq: Every day | ORAL | 3 refills | Status: DC

## 2023-10-19 NOTE — Telephone Encounter (Signed)
 Patient calls nurse line regarding amiodarone  prescription.   This was ordered on 10/17/23, however, was printed. Patient is needing prescription to be sent to [facility] [address].   Resent electronically to preferred pharmacy.   Elsie Halo, RN

## 2023-10-20 NOTE — Progress Notes (Signed)
 Complex Care Management Note Care Guide Note  10/20/2023 Name: Karen Casey MRN: [id number] DOB: 1955/07/19   Complex Care Management Outreach Attempts: A second unsuccessful outreach was attempted today to offer the patient with information about available complex care management services.  Follow Up Plan:  Additional outreach attempts will be made to offer the patient complex care management information and services.   Encounter Outcome:  No Answer  Barnie Bora  [facility]  [facility], [facility] Guide  Direct Dial: [phone]  Fax [phone]

## 2023-10-21 NOTE — Progress Notes (Signed)
 Complex Care Management Note Care Guide Note  10/21/2023 Name: Karen Casey MRN: [id number] DOB: [birth year]   Complex Care Management Outreach Attempts: A third unsuccessful outreach was attempted today to offer the patient with information about available complex care management services.  Follow Up Plan:  No further outreach attempts will be made at this time. We have been unable to contact the patient to offer or enroll patient in complex care management services.  Encounter Outcome:  No Answer  Barnie Bora  [facility]  [facility], [facility] Guide  Direct Dial: [phone]  Fax [phone]

## 2023-10-24 ENCOUNTER — Telehealth: Payer: Self-pay | Admitting: *Deleted

## 2023-10-24 NOTE — Progress Notes (Signed)
 Complex Care Management Note  Care Guide Note 10/24/2023 Name: [name] MRN: [id number] DOB: 20-Sep-1955  Karen Casey is a 68 y.o. year old female who sees Pray, Cain Castillo, MD for primary care. I reached out to Karen Casey by phone today to offer complex care management services.  Ms. Brother was given information about Complex Care Management services today including:   The Complex Care Management services include support from the care team which includes your Nurse Care Manager, Clinical Social Worker, or Pharmacist.  The Complex Care Management team is here to help remove barriers to the health concerns and goals most important to you. Complex Care Management services are voluntary, and the patient may decline or stop services at any time by request to their care team member.   Complex Care Management Consent Status: Patient agreed to services and verbal consent obtained.   Follow up plan:  Telephone appointment with complex care management team member scheduled for:  10/25/23  Encounter Outcome:  Patient Scheduled  Barnie Bora  [facility]  [facility], [facility] Guide  Direct Dial: [phone]  Fax [phone]

## 2023-10-24 NOTE — Progress Notes (Signed)
 Complex Care Management Note Care Guide Note  10/24/2023 Name: Karen Casey MRN: [id number] DOB: [birth year]   Complex Care Management Outreach Attempts: An unsuccessful telephone outreach was attempted today to offer the patient information about available complex care management services.  Follow Up Plan:  Additional outreach attempts will be made to offer the patient complex care management information and services.   Encounter Outcome:  No Answer  Barnie Bora  [facility]  [facility], [facility] Guide  Direct Dial: [phone]  Fax [phone]

## 2023-10-25 ENCOUNTER — Other Ambulatory Visit: Payer: Self-pay

## 2023-10-25 ENCOUNTER — Telehealth: Payer: Self-pay

## 2023-10-25 NOTE — Progress Notes (Signed)
 Complex Care Management Note Care Guide Note  10/25/2023 Name: Karen Casey MRN: [id number] DOB: 1956-04-25   Complex Care Management Outreach Attempts: An unsuccessful telephone outreach was attempted today to offer the patient information about available complex care management services.  Follow Up Plan:  Additional outreach attempts will be made to offer the patient complex care management information and services.   Encounter Outcome:  No Answer  Joelle Flessner [facility]  [facility] Guide Direct Dial: [phone]  Fax: [phone] Website: [email or web address]

## 2023-10-25 NOTE — Patient Outreach (Addendum)
 Complex Care Management   Visit Note  10/25/2023  Name:  Karen Casey MRN: [id number] DOB: Jun 27, 1955  Situation: Referral received for Complex Care Management related to SDOH Barriers:  Food insecurity I obtained verbal consent from Patient.  Visit completed with patient  on the phone  Background:   Past Medical History:  Diagnosis Date   Alcohol  abuse    last use 03/09/21, marijuana last 03/09/21   Allergy    Anxiety    Cataract 06/09/2012   Right eye and left eye   COPD (chronic obstructive pulmonary disease) (HCC)    Depression    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/04/2020   Due to benzodiazepine withdrawal   Dyspnea    uses oxygen  2L via [place] prn   Enterococcal bacteremia 02/10/2022   GERD (gastroesophageal reflux disease)    Headache    Hypertension    Long term (current) use of anticoagulants    Nausea vomiting and diarrhea 02/09/2022   Neuromuscular disorder (HCC)    Neuropathy    Paroxysmal atrial fibrillation (HCC)    Positive blood culture 02/10/2022   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 Patent and SW agreed for food pantries to be emailed to email on file. SW also contacted [employer] and left a message to complete a referral for MOW. SW scheduled patient with RNCM.  Patient changed her mind about nurse support. SW cancelled appointment.   SDOH Interventions    Flowsheet Row Office Visit from 04/15/2023 in [facility] - A Dept Of [place]. [facility] Telephone from 04/12/2023 in [facility] Community Care Coordination ED to Hosp-Admission (Discharged) from 01/31/2023 in [place] LONG 4TH FLOOR PROGRESSIVE CARE AND  UROLOGY Office Visit from 12/10/2022 in [facility] - A Dept Of [place]. [facility] Clinical Support from 10/22/2022 in [facility] - A Dept Of . [facility] Office Visit from 10/22/2021 in [facility] - A Dept Of . [facility]  SDOH Interventions        Food Insecurity Interventions -- Intervention Not Indicated -- -- Intervention Not Indicated --  Housing Interventions -- Intervention Not Indicated -- -- Intervention Not Indicated --  Transportation Interventions -- -- Inpatient TOC, Patient Resources (Friends/Family) -- Intervention Not Indicated --  Utilities Interventions -- Intervention Not Indicated -- -- Intervention Not Indicated --  Alcohol  Usage Interventions -- -- -- -- Intervention Not Indicated (Score <7) --  Depression Interventions/Treatment  Medication, Counseling, Currently on Treatment -- -- Referral to Psychiatry, Medication, Currently on Treatment Medication, Counseling Medication  Financial Strain Interventions -- -- -- -- Intervention Not Indicated --  Physical Activity Interventions -- -- -- -- Intervention Not Indicated --  Stress Interventions -- -- -- -- [employer] --  Social Connections Interventions -- -- -- -- Intervention Not Indicated --         Recommendation:   No recommendations at this time  Follow Up Plan:   Telephone follow-up with SW on 11/08/23 at 9:am   Valora Gear, BSW, MHA [place]  [facility] Social  Worker, [employer] [phone]

## 2023-10-25 NOTE — Patient Instructions (Signed)
 Visit Information  Thank you for taking time to visit with me today. Please don't hesitate to contact me if I can be of assistance to you before our next scheduled appointment.  Our next appointment is by telephone on 11/08/23 at 9 Please call the care guide team at [phone] if you need to cancel or reschedule your appointment.   Following is a copy of your care plan:   Goals Addressed             This Visit's Progress    BSW VBCI Social Work Care Plan       Problems:   Food Insecurity   CSW Clinical Goal(s):   Over the next 15 days the Patient will will follow up with food pantries as directed by Social Work.  Interventions:  Social Determinants of Health in Patient with food : SDOH assessments completed: Food Insecurity  Evaluation of current treatment plan related to unmet needs Food resources: provided food pantries via email  Patient Goals/Self-Care Activities:  Follow up with food pantries  Plan:   An initial telephone outreach has been scheduled for: RNCM for 11/01/23 at 11:am        Please call the Suicide and Crisis Lifeline: 988 call the [place]  National Suicide Prevention Lifeline: (540) 245-4319 or TTY: 539-805-9833 TTY 785 652 9074) to talk to a trained counselor call 1-800-273-TALK (toll free, 24 hour hotline) call 911 if you are experiencing a Mental Health or Behavioral Health Crisis or need someone to talk to.  Patient verbalizes understanding of instructions and care plan provided today and agrees to view in MyChart. Active MyChart status and patient understanding of how to access instructions and care plan via MyChart confirmed with patient.    Valora Gear, Florestine Hurl, MHA [place]  [facility] Social Worker, Population Health [phone]

## 2023-10-26 ENCOUNTER — Telehealth: Payer: Self-pay

## 2023-10-26 NOTE — Progress Notes (Signed)
 Complex Care Management Note Care Guide Note  10/26/2023 Name: Karen Casey MRN: [id number] DOB: Jul 25, 1955  Karen Casey is a 68 y.o. year old female who is a primary care patient of Pray, Cain Castillo, MD . The community resource team was consulted for assistance with Transportation Needs   SDOH screenings and interventions completed:  Yes  Social Drivers of Health From This Encounter   Food Insecurity: Patient Declined (10/26/2023)   Hunger Vital Sign    Worried About Running Out of Food in the Last Year: Patient declined    Ran Out of Food in the Last Year: Patient declined  Housing: Unknown (10/26/2023)   Housing Stability Vital Sign    Unable to Pay for Housing in the Last Year: No    Homeless in the Last Year: No  Financial Resource Strain: Low Risk  (10/26/2023)   Overall Financial Resource Strain (CARDIA)    Difficulty of Paying Living Expenses: Not very hard  Transportation Needs: Unmet Transportation Needs (10/26/2023)   PRAPARE - Administrator, Civil Service (Medical): Yes    Lack of Transportation (Non-Medical): Yes  Utilities: Not At Risk (10/26/2023)   Utilities    Threatened with loss of utilities: No    SDOH Interventions Today    Flowsheet Row Most Recent Value  SDOH Interventions   Food Insecurity Interventions Patient Declined  Housing Interventions Other (Comment)  [Patient stated she is currently staying with her daughter and is on the wait list for public housing.]  Transportation Interventions Other (Comment)  [Patient stated she is interested in receiving information about [employer] transportation. She requested that I leave the number on her voicemail as well.]        Care guide performed the following interventions: Patient provided with information about care guide support team and interviewed to confirm resource needs.  Follow Up Plan:  No further follow up planned at this time. The patient has been provided with needed  resources.  Encounter Outcome:  Patient Visit Completed  Jacquelyne Quarry [facility]  [facility] Guide Direct Dial: [phone]  Fax: [phone] Website: [email or web address]

## 2023-10-28 NOTE — Progress Notes (Signed)
    SUBJECTIVE:   CHIEF COMPLAINT / HPI:   Anxiety/depression- following with psychiatry, tricking living situation with her daughter, has to go upstairs to get to bathroom.   pAfib- sometimes notices palpitations, denies chest pain or shortness of breath. Does not want to take Eliquis  anymore due to easy bruising. Discussed risk of stroke.  Tobacco use disorder- still smoking 1 ppd, does not want to quit.  COPD- using Trelegy, still smoking as above. Has cough with yellow sputum, comes and goes. No increase in sputum, no current fevers, or chills, no shortness of breath.  Toenails- are thickened and has trouble trimming them.  Peripheral neuropathy- still with decreased sensation in her feet, the gabapentin  is helping. Needs help trimming toenails  PERTINENT  PMH / PSH: paroxysmal Afib (not on Eliquis  per patient preference), alcohol  use disorder, tobacco use disorder, COPD, anxiety and depression,  OBJECTIVE:   BP 108/75   Pulse 89   Wt 136 lb 6.4 oz (61.9 kg)   SpO2 98%   BMI 23.41 kg/m   General: A&O, NAD HEENT: No sign of trauma, EOM grossly intact Cardiac: RRR, no m/r/g Respiratory: CTAB, normal WOB, no w/c/r GI: Soft, NTTP, non-distended  Extremities: NTTP, no peripheral edema. Neuro: Normal gait, moves all four extremities appropriately. Psych: Appropriate mood and affect   Dermatologic Exam: Nails: + onchomycosis. + nail bed thickening. + toenails curling around toes Callouses: No callouses. No fissures. Web spaces: No macerations or open lesions Redness/Erythema: None  Musculoskeletal Exam: + bunion bilateral, no hammertoes, prominent metatarsals, collapsed arch, or previous amputation. Vascular Assessment: Pedal hair growth present. 2+ posterior tibial and dorsalis pedis pulses. Neurologic Exam: 10-gram monofilament exam, R 1/6 and L 1/6.  EKG today: NSR, no ectopy, no ST segment changes or TWI ASSESSMENT/PLAN:   Assessment & Plan Overgrown  toenails Referral to podiatry sent today, no skin breakdown Paroxysmal atrial fibrillation (HCC) Continue amiodarone , pt declines Eliquis , did discuss risk of CVA when not taking it, EKG today showing NSR Hypomagnesemia Checking level today Elevated LFTs Rechecking today Alcohol -induced polyneuropathy (HCC) Checking vit B12 levels today, foot exam without skin breakdown or ulcers, referred to podiatry for toenail trimming, continue gapapentin Incontinence in female Due to inability to get to bathroom in time, 6-10 adult diapers/day, as well as wipes, are medically necessary to prevent skin breakdown and prevent pressure ulcers.  Encounter for screening for lung cancer CT chest ordered  Osteoporosis with current pathological fracture with routine healing, unspecified osteoporosis type, subsequent encounter Checking vitamin D  levels, had gotten Prolia  August 2024 but hasn't gotten repeat injection Encounter for screening mammogram for breast cancer Screening mammogram ordered, given number to call and schedule   Schedule follow up for pap smear/physical  Charmel Cooter, MD [facility] Health [facility] Center

## 2023-10-31 ENCOUNTER — Ambulatory Visit (INDEPENDENT_AMBULATORY_CARE_PROVIDER_SITE_OTHER): Admitting: Family Medicine

## 2023-10-31 ENCOUNTER — Encounter: Payer: Self-pay | Admitting: Family Medicine

## 2023-10-31 VITALS — BP 108/75 | HR 89 | Wt 136.4 lb

## 2023-10-31 DIAGNOSIS — R32 Unspecified urinary incontinence: Secondary | ICD-10-CM | POA: Diagnosis not present

## 2023-10-31 DIAGNOSIS — G621 Alcoholic polyneuropathy: Secondary | ICD-10-CM

## 2023-10-31 DIAGNOSIS — I480 Paroxysmal atrial fibrillation: Secondary | ICD-10-CM | POA: Diagnosis not present

## 2023-10-31 DIAGNOSIS — M8000XD Age-related osteoporosis with current pathological fracture, unspecified site, subsequent encounter for fracture with routine healing: Secondary | ICD-10-CM | POA: Diagnosis not present

## 2023-10-31 DIAGNOSIS — R7989 Other specified abnormal findings of blood chemistry: Secondary | ICD-10-CM

## 2023-10-31 DIAGNOSIS — L602 Onychogryphosis: Secondary | ICD-10-CM

## 2023-10-31 DIAGNOSIS — Z122 Encounter for screening for malignant neoplasm of respiratory organs: Secondary | ICD-10-CM

## 2023-10-31 DIAGNOSIS — Z1231 Encounter for screening mammogram for malignant neoplasm of breast: Secondary | ICD-10-CM

## 2023-10-31 NOTE — Assessment & Plan Note (Signed)
Checking level today

## 2023-10-31 NOTE — Assessment & Plan Note (Signed)
 Checking vitamin D  levels, had gotten Prolia  August 2024 but hasn't gotten repeat injection

## 2023-10-31 NOTE — Assessment & Plan Note (Signed)
 Rechecking today

## 2023-10-31 NOTE — Patient Instructions (Addendum)
 It was wonderful to see you today.  Please bring ALL of your medications with you to every visit.   Today we talked about:  - We will check lab work today - We did an EKG today- you are in a normal heart rhythm  - I sent a referral to a podiatrist to trim your toenails  We ordered a screening mammogram for you today. You can call [facility] at [phone] to schedule this.  CT chest for lung screening is due as well  Thank you for choosing [facility].   Please call [phone] with any questions about today's appointment.  Please arrive at least 15 minutes prior to your scheduled appointments.   If you had blood work today, I will send you a MyChart message or a letter if results are normal. Otherwise, I will give you a call.   If you had a referral placed, they will call you to set up an appointment. Please give us  a call if you don't hear back in the next 2 weeks.   If you need additional refills before your next appointment, please call your pharmacy first.   Avanell Bob, MD  Family Medicine

## 2023-10-31 NOTE — Assessment & Plan Note (Signed)
 Checking vit B12 levels today, foot exam without skin breakdown or ulcers, referred to podiatry for toenail trimming, continue gapapentin

## 2023-10-31 NOTE — Assessment & Plan Note (Signed)
 Continue amiodarone , pt declines Eliquis , did discuss risk of CVA when not taking it, EKG today showing NSR

## 2023-11-01 ENCOUNTER — Other Ambulatory Visit

## 2023-11-01 ENCOUNTER — Telehealth: Payer: Self-pay

## 2023-11-01 ENCOUNTER — Ambulatory Visit: Payer: Self-pay | Admitting: Family Medicine

## 2023-11-01 ENCOUNTER — Other Ambulatory Visit: Payer: Self-pay | Admitting: Family Medicine

## 2023-11-01 DIAGNOSIS — R110 Nausea: Secondary | ICD-10-CM

## 2023-11-01 DIAGNOSIS — L309 Dermatitis, unspecified: Secondary | ICD-10-CM

## 2023-11-01 LAB — COMPREHENSIVE METABOLIC PANEL WITH GFR
ALT: 6 IU/L (ref 0–32)
AST: 16 IU/L (ref 0–40)
Albumin: 4.3 g/dL (ref 3.9–4.9)
Alkaline Phosphatase: 113 IU/L (ref 44–121)
BUN/Creatinine Ratio: 8 — ABNORMAL LOW (ref 12–28)
BUN: 7 mg/dL — ABNORMAL LOW (ref 8–27)
Bilirubin Total: <0.2 mg/dL (ref 0.0–1.2)
CO2: 19 mmol/L — ABNORMAL LOW (ref 20–29)
Calcium: 8.7 mg/dL (ref 8.7–10.3)
Chloride: 100 mmol/L (ref 96–106)
Creatinine, Ser: 0.86 mg/dL (ref 0.57–1.00)
Globulin, Total: 2.1 g/dL (ref 1.5–4.5)
Glucose: 96 mg/dL (ref 70–99)
Potassium: 4.5 mmol/L (ref 3.5–5.2)
Sodium: 136 mmol/L (ref 134–144)
Total Protein: 6.4 g/dL (ref 6.0–8.5)
eGFR: 75 mL/min/{1.73_m2} (ref >59)

## 2023-11-01 LAB — TSH RFX ON ABNORMAL TO FREE T4: TSH: 2.96 u[IU]/mL (ref 0.450–4.500)

## 2023-11-01 LAB — CBC
Hematocrit: 37.6 % (ref 34.0–46.6)
Hemoglobin: 12.4 g/dL (ref 11.1–15.9)
MCH: 28.5 pg (ref 26.6–33.0)
MCHC: 33 g/dL (ref 31.5–35.7)
MCV: 86 fL (ref 79–97)
Platelets: 251 10*3/uL (ref 150–450)
RBC: 4.35 x10E6/uL (ref 3.77–5.28)
RDW: 14.7 % (ref 11.7–15.4)
WBC: 6.1 10*3/uL (ref 3.4–10.8)

## 2023-11-01 LAB — MAGNESIUM: Magnesium: 1.7 mg/dL (ref 1.6–2.3)

## 2023-11-01 LAB — VITAMIN B12: Vitamin B-12: 394 pg/mL (ref 232–1245)

## 2023-11-01 LAB — VITAMIN D 25 HYDROXY (VIT D DEFICIENCY, FRACTURES): Vit D, 25-Hydroxy: 28.4 ng/mL — ABNORMAL LOW (ref 30.0–100.0)

## 2023-11-01 MED ORDER — HYDROCORTISONE 1 % EX OINT: 1.0000 | TOPICAL_OINTMENT | Freq: Two times a day (BID) | CUTANEOUS | 0 refills | Status: DC

## 2023-11-01 MED ORDER — ONDANSETRON 4 MG PO TBDP: 4.0000 mg | ORAL_TABLET | Freq: Three times a day (TID) | ORAL | 0 refills | Status: DC | PRN

## 2023-11-01 NOTE — Telephone Encounter (Signed)
 Spoke with patient.   Advised to let me know when to get her food bag ready.   Will be happy to leave it at the front on the day of arrival.   She reports she will let me know when she plans on picking it up.   Patient appreciative.

## 2023-11-01 NOTE — Progress Notes (Signed)
 PRN Zofran  and hydrocortisone  ointment sent to pharmacy.

## 2023-11-01 NOTE — Telephone Encounter (Signed)
 Patient calls nurse line in regards to visit yesterday.   She reports Dr. Drue Gerald was going to send in a refill on "my nausea" medication and a "cream."   She reports she was going to be given food for her family, however it was late in the morning and "everyone forgot."   Will forward to PCP for medication management.   Will provide food bag for patient.

## 2023-11-01 NOTE — Telephone Encounter (Signed)
 Patient calls nurse line in regards to recent medications.  Patient reports she no longer uses [facility] in [place]. She reports she wants this out of her chart.   I called [facility] in [place] and cancelled recent prescriptions.   Prescriptions resent to [facility] in [place] per patient request.   I have updated her pharmacy in Epic.

## 2023-11-03 ENCOUNTER — Telehealth: Payer: Self-pay

## 2023-11-03 DIAGNOSIS — R45851 Suicidal ideations: Secondary | ICD-10-CM | POA: Diagnosis not present

## 2023-11-03 DIAGNOSIS — T43292A Poisoning by other antidepressants, intentional self-harm, initial encounter: Secondary | ICD-10-CM | POA: Diagnosis not present

## 2023-11-03 DIAGNOSIS — F32A Depression, unspecified: Secondary | ICD-10-CM | POA: Diagnosis not present

## 2023-11-03 NOTE — Telephone Encounter (Signed)
 Called Centralized Scheduling and spoke with Terrine to schedule patient's CT appointment.  Was able to schedule CT appointment for 6/5 at 12:30pm, with the check in time for 12:15pm. Location will be at [facility], main entrance and to go to the radiology department.  Patient must not wear a bra with under wire, a shirt with buttons or zippers.  Patient stated that she has to inform her sister about her appointment because her sister is her transportation.  Did advise patient that I can give her centralized scheduling number in case she reschedules.  Patient stated that she isnt near a paper and pen at the moment, she is currently outside and she will call back.  If patient calls back for the Centralized Scheduling number, please provide her the number [phone].  Christ Courier, CMA

## 2023-11-04 ENCOUNTER — Telehealth: Payer: Self-pay

## 2023-11-04 DIAGNOSIS — F1020 Alcohol dependence, uncomplicated: Secondary | ICD-10-CM | POA: Diagnosis not present

## 2023-11-04 DIAGNOSIS — T50902A Poisoning by unspecified drugs, medicaments and biological substances, intentional self-harm, initial encounter: Secondary | ICD-10-CM | POA: Diagnosis not present

## 2023-11-04 DIAGNOSIS — F411 Generalized anxiety disorder: Secondary | ICD-10-CM | POA: Diagnosis not present

## 2023-11-04 DIAGNOSIS — F332 Major depressive disorder, recurrent severe without psychotic features: Secondary | ICD-10-CM | POA: Diagnosis not present

## 2023-11-04 NOTE — Telephone Encounter (Signed)
 Karen Casey calls nurse line requesting to speak with PCP.   She reports her sister Karen Casey has been living with her daughter for the last little bit. She reports they have a volatile relationship at best. She reports last night Karen Casey went to the ED for intentional over dose. She reports she was drinking all day and then took (20) Trazodone  pills.   She reports she would like to discuss placement for Karen Casey post discharge. She reports she realizes she isn't "quite there" for a nursing home, however she does need assistance, possibly assisted living.   She reports she will not be able to live with her due to her volatile state at times and living with her daughter is not longer an option.   Advised will forward to PCP.   Karen Casey-[phone]

## 2023-11-04 NOTE — Telephone Encounter (Signed)
 Called and spoke with patients sister Karen Casey, [place] on file. Discussed talking to hospital when she is stabilized about a safe discharge plan and letting hospital team know that living with her or [name]'s daughter was not a safe option. Discussed it is easier to get into facility from inpatient versus in the outpatient setting. Provided supportive listening.  Avanell Bob MD

## 2023-11-05 DIAGNOSIS — F332 Major depressive disorder, recurrent severe without psychotic features: Secondary | ICD-10-CM | POA: Diagnosis not present

## 2023-11-06 DIAGNOSIS — J449 Chronic obstructive pulmonary disease, unspecified: Secondary | ICD-10-CM | POA: Diagnosis not present

## 2023-11-06 DIAGNOSIS — G629 Polyneuropathy, unspecified: Secondary | ICD-10-CM | POA: Diagnosis not present

## 2023-11-06 DIAGNOSIS — K219 Gastro-esophageal reflux disease without esophagitis: Secondary | ICD-10-CM | POA: Diagnosis not present

## 2023-11-06 DIAGNOSIS — R9431 Abnormal electrocardiogram [ECG] [EKG]: Secondary | ICD-10-CM | POA: Diagnosis not present

## 2023-11-06 DIAGNOSIS — F17210 Nicotine dependence, cigarettes, uncomplicated: Secondary | ICD-10-CM | POA: Diagnosis not present

## 2023-11-06 DIAGNOSIS — F1020 Alcohol dependence, uncomplicated: Secondary | ICD-10-CM | POA: Diagnosis not present

## 2023-11-06 DIAGNOSIS — I480 Paroxysmal atrial fibrillation: Secondary | ICD-10-CM | POA: Diagnosis not present

## 2023-11-06 DIAGNOSIS — F332 Major depressive disorder, recurrent severe without psychotic features: Secondary | ICD-10-CM | POA: Diagnosis not present

## 2023-11-06 DIAGNOSIS — Z87898 Personal history of other specified conditions: Secondary | ICD-10-CM | POA: Diagnosis not present

## 2023-11-08 ENCOUNTER — Other Ambulatory Visit: Payer: Self-pay

## 2023-11-08 DIAGNOSIS — G629 Polyneuropathy, unspecified: Secondary | ICD-10-CM | POA: Diagnosis not present

## 2023-11-08 DIAGNOSIS — R9431 Abnormal electrocardiogram [ECG] [EKG]: Secondary | ICD-10-CM | POA: Diagnosis not present

## 2023-11-08 DIAGNOSIS — I480 Paroxysmal atrial fibrillation: Secondary | ICD-10-CM | POA: Diagnosis not present

## 2023-11-08 DIAGNOSIS — J449 Chronic obstructive pulmonary disease, unspecified: Secondary | ICD-10-CM | POA: Diagnosis not present

## 2023-11-08 DIAGNOSIS — F17210 Nicotine dependence, cigarettes, uncomplicated: Secondary | ICD-10-CM | POA: Diagnosis not present

## 2023-11-08 DIAGNOSIS — F332 Major depressive disorder, recurrent severe without psychotic features: Secondary | ICD-10-CM | POA: Diagnosis not present

## 2023-11-08 DIAGNOSIS — I10 Essential (primary) hypertension: Secondary | ICD-10-CM | POA: Diagnosis not present

## 2023-11-08 DIAGNOSIS — Z87898 Personal history of other specified conditions: Secondary | ICD-10-CM | POA: Diagnosis not present

## 2023-11-08 DIAGNOSIS — K219 Gastro-esophageal reflux disease without esophagitis: Secondary | ICD-10-CM | POA: Diagnosis not present

## 2023-11-08 DIAGNOSIS — N3944 Nocturnal enuresis: Secondary | ICD-10-CM | POA: Diagnosis not present

## 2023-11-09 NOTE — Progress Notes (Deleted)
    SUBJECTIVE:   CHIEF COMPLAINT / HPI:   Hospital follow up- suicide attempt, took 20 trazodone  and drank 7 beers in an attempt to harm herself.  Hospitalized at [facility]. Made safety plan. Is now living   PERTINENT  PMH / PSH: alcohol  use disorder, depression/anxiety, HTN, paroxysmal Afib, GERD, h/o thoracic compression fracture,   OBJECTIVE:   There were no vitals taken for this visit.  General: A&O, NAD HEENT: No sign of trauma, EOM grossly intact Cardiac: RRR, no m/r/g Respiratory: CTAB, normal WOB, no w/c/r GI: Soft, NTTP, non-distended  Extremities: NTTP, no peripheral edema. Neuro: Normal gait, moves all four extremities appropriately. Psych: Appropriate mood and affect   ASSESSMENT/PLAN:   Assessment & Plan      Rollene FORBES Keeling, MD [facility] Center

## 2023-11-10 ENCOUNTER — Telehealth: Payer: Self-pay

## 2023-11-10 ENCOUNTER — Ambulatory Visit: Admitting: Family Medicine

## 2023-11-10 NOTE — Transitions of Care (Post Inpatient/ED Visit) (Signed)
   11/10/2023  Name: Karen Casey MRN: [id number] DOB: 11/27/1955  Today's TOC FU Call Status: Today's TOC FU Call Status:: Unsuccessful Call (1st Attempt) Unsuccessful Call (1st Attempt) Date: 11/10/23  Attempted to reach the patient regarding the most recent Inpatient/ED visit. Upon chart review for this outreach, it was noted that there is a scheduled PCP HFU appointment today, 11/10/23 2 1050 am.   Follow Up Plan: Additional outreach attempts will be made to reach the patient to complete the Transitions of Care (Post Inpatient/ED visit) call.    Katheryn Pandy MSN, RN RN Case Sales executive Health  VBCI-Population Health Office Hours M-F [phone] Direct Dial: [phone] Main Phone [phone]  Fax: [phone] [email or web address]

## 2023-11-11 ENCOUNTER — Telehealth: Payer: Self-pay

## 2023-11-11 NOTE — Transitions of Care (Post Inpatient/ED Visit) (Signed)
   11/11/2023  Name: Karen Casey MRN: [id number] DOB: [birth year]  Today's TOC FU Call Status: Today's TOC FU Call Status:: Unsuccessful Call (2nd Attempt) Unsuccessful Call (2nd Attempt) Date: 11/11/23  Attempted to reach the patient regarding the most recent Inpatient/ED visit. Upon chart review for this patient it was noted in Care Everywhere by RN at e pm 11/09/23,  that after discharge from [facility], [facility] contacted patient after discharge to inform patient of follow up appointments with PCP and [facility] provider.   Follow Up Plan: Additional outreach attempts will be made to reach the patient to complete the Transitions of Care (Post Inpatient/ED visit) call.    Katheryn Pandy MSN, RN RN Case Sales executive Health  VBCI-Population Health Office Hours M-F [phone] Direct Dial: [phone] Main Phone [phone]  Fax: [phone] [email or web address]

## 2023-11-14 ENCOUNTER — Telehealth: Payer: Self-pay

## 2023-11-14 NOTE — Transitions of Care (Post Inpatient/ED Visit) (Signed)
   11/14/2023  Name: Karen Casey MRN: [id number] DOB: April 09, 1956  Today's TOC FU Call Status: Today's TOC FU Call Status:: Unsuccessful Call (3rd Attempt) Unsuccessful Call (3rd Attempt) Date: 11/14/23  Attempted to reach the patient regarding the most recent Inpatient/ED visit.  Follow Up Plan: No further outreach attempts will be made at this time. We have been unable to contact the patient.   Katheryn Pandy MSN, RN RN Case Sales executive Health  VBCI-Population Health Office Hours M-F [phone] Direct Dial: [phone] Main Phone [phone]  Fax: [phone] [email or web address]

## 2023-11-17 ENCOUNTER — Ambulatory Visit (HOSPITAL_COMMUNITY)

## 2023-11-22 ENCOUNTER — Telehealth: Payer: Self-pay | Admitting: *Deleted

## 2023-11-22 DIAGNOSIS — Z599 Problem related to housing and economic circumstances, unspecified: Secondary | ICD-10-CM

## 2023-11-22 NOTE — Progress Notes (Signed)
 Complex Care Management Care Guide Note  11/22/2023 Name: Karen Casey MRN: [id number] DOB: Aug 13, 1955  Karen Casey is a 68 y.o. year old female who is a primary care patient of Pray, Cain Castillo, MD and is actively engaged with the care management team. I reached out to Karen Casey by phone today to assist with re-scheduling  with the BSW.  Follow up plan: Telephone appointment with complex care management team member scheduled for:  12/05/23  Barnie Bora  [facility] Health  [facility] Institute, [facility] Guide  Direct Dial: [phone]  Fax [phone]

## 2023-11-23 ENCOUNTER — Ambulatory Visit: Payer: Self-pay | Admitting: Podiatry

## 2023-11-24 ENCOUNTER — Telehealth: Payer: Self-pay

## 2023-11-24 NOTE — Progress Notes (Signed)
   Telephone encounter was:  Successful.  Complex Care Management Note Care Guide Note  11/24/2023 Name: Karen Casey MRN: [id number] DOB: [birth year]  [name] is a 68 y.o. year old female who is a primary care patient of Pray, Cain Castillo, MD . The community resource team was consulted for assistance with Financial Difficulties related to Financial strain  SDOH screenings and interventions completed:  Yes  Social Drivers of Health From This Encounter   Financial Resource Strain: High Risk (11/24/2023)   Overall Financial Resource Strain (CARDIA)    Difficulty of Paying Living Expenses: Very hard    SDOH Interventions Today    Flowsheet Row Most Recent Value  SDOH Interventions   Financial Strain Interventions Community Resources Provided     Care guide performed the following interventions: Patient provided with information about care guide support team and interviewed to confirm resource needs.Pt is having financial strain with [employer] system and financial assistance with utilities email: [email or web address]   Follow Up Plan:  Care guide will follow up with patient by phone over the next day  Encounter Outcome:  Patient Visit Completed    [name] [name]  [facility] Guide, Phone: [phone] Fax: [phone] Website: [email or web address]

## 2023-11-28 ENCOUNTER — Telehealth: Payer: Self-pay

## 2023-11-28 ENCOUNTER — Other Ambulatory Visit: Payer: Self-pay | Admitting: Family Medicine

## 2023-11-28 DIAGNOSIS — L309 Dermatitis, unspecified: Secondary | ICD-10-CM

## 2023-11-28 NOTE — Progress Notes (Signed)
   Telephone encounter was:  Successful.  Complex Care Management Note Care Guide Note  11/28/2023 Name: Karen Casey MRN: [id number] DOB: December 14, 1955  Karen Casey is a 68 y.o. year old female who is a primary care patient of Pray, Cain Castillo, MD . The community resource team was consulted for assistance with Home septic  SDOH screenings and interventions completed:  No        Care guide performed the following interventions: Patient provided with information about care guide support team and interviewed to confirm resource needs.Patient requested resources to help with her septic system and roof leaking. [facility] HOME REHABILITATION PROGRAM [phone] was given  Follow Up Plan:  No further follow up planned at this time. The patient has been provided with needed resources.  Encounter Outcome:  Patient Visit Completed    [name]  [facility] Guide, Phone: [phone] Fax: [phone] Website: [email or web address]

## 2023-11-29 ENCOUNTER — Telehealth: Payer: Self-pay

## 2023-11-29 NOTE — Telephone Encounter (Signed)
 Patient calls nurse line in regards to Hydrocortisone  and Pantoprazole .  She reports difficulty picking these up.   I called the pharmacy. Hydrocortisone  has been ordered and should be in later this week. Pantoprazole  was sent to [facility] on [address] in [place]. Pharmacist transferred prescription to her store.   Patient aware.

## 2023-12-05 ENCOUNTER — Telehealth: Payer: Self-pay

## 2023-12-07 ENCOUNTER — Other Ambulatory Visit: Payer: Self-pay | Admitting: Family Medicine

## 2023-12-07 DIAGNOSIS — R110 Nausea: Secondary | ICD-10-CM

## 2023-12-08 DIAGNOSIS — R2681 Unsteadiness on feet: Secondary | ICD-10-CM | POA: Diagnosis not present

## 2023-12-08 DIAGNOSIS — Z008 Encounter for other general examination: Secondary | ICD-10-CM | POA: Diagnosis not present

## 2023-12-08 DIAGNOSIS — F17210 Nicotine dependence, cigarettes, uncomplicated: Secondary | ICD-10-CM | POA: Diagnosis not present

## 2023-12-12 ENCOUNTER — Telehealth: Payer: Self-pay

## 2023-12-12 ENCOUNTER — Ambulatory Visit: Payer: Self-pay | Admitting: Podiatry

## 2023-12-12 ENCOUNTER — Encounter: Payer: Self-pay | Admitting: Family Medicine

## 2023-12-12 ENCOUNTER — Ambulatory Visit (INDEPENDENT_AMBULATORY_CARE_PROVIDER_SITE_OTHER): Admitting: Family Medicine

## 2023-12-12 VITALS — BP 123/76 | HR 89 | Ht 64.0 in | Wt 139.6 lb

## 2023-12-12 DIAGNOSIS — F1090 Alcohol use, unspecified, uncomplicated: Secondary | ICD-10-CM

## 2023-12-12 DIAGNOSIS — R918 Other nonspecific abnormal finding of lung field: Secondary | ICD-10-CM | POA: Diagnosis not present

## 2023-12-12 DIAGNOSIS — R262 Difficulty in walking, not elsewhere classified: Secondary | ICD-10-CM

## 2023-12-12 DIAGNOSIS — R1031 Right lower quadrant pain: Secondary | ICD-10-CM

## 2023-12-12 DIAGNOSIS — Z9151 Personal history of suicidal behavior: Secondary | ICD-10-CM

## 2023-12-12 DIAGNOSIS — I480 Paroxysmal atrial fibrillation: Secondary | ICD-10-CM

## 2023-12-12 DIAGNOSIS — Z1231 Encounter for screening mammogram for malignant neoplasm of breast: Secondary | ICD-10-CM | POA: Diagnosis not present

## 2023-12-12 DIAGNOSIS — J449 Chronic obstructive pulmonary disease, unspecified: Secondary | ICD-10-CM

## 2023-12-12 DIAGNOSIS — S76019A Strain of muscle, fascia and tendon of unspecified hip, initial encounter: Secondary | ICD-10-CM

## 2023-12-12 NOTE — Patient Instructions (Addendum)
 It was wonderful to see you today.  Please bring ALL of your medications with you to every visit.   Today we talked about:  We scheduled you for a CT scan of your lungs.   We ordered a screening mammogram for you today. You can call [facility] at [phone] to schedule this.   I ordered you an Xray of your right hip- you can go to [facility] on [address] to get this. You can use Tylenol  as needed for pain, please do not take more than 3,000 mg daily.  You can call [facility] at  [address], [address] [place], [place] [address] Phone: [phone] To discuss getting set back up with a therapist.  I have ordered home health physical therapy for you to go out to the house.  If you are feeling suicidal or depression symptoms worsen please immediately go to:   If you are thinking about harming yourself or having thoughts of suicide, or if you know someone who is, seek help right away. If you are in crisis, make sure you are not left alone.  If someone else is in crisis, make sure he/she/they is not left alone  Call 988 OR 1-800-273-TALK  24 Hour Availability for Walk-IN services  [facility]  [address] [place], [place] [phone] Crisis [phone]    Other crisis resources:  [facility] [employer] (Domestic Violence, Rape & Victim Assistance [phone]  [facility] [employer] Crisis [facility]    (ONLY from 8am-4pm)    [phone]  [facility] Mobile Crisis Unit (24/7)   [phone]  [place]  National Suicide Hotline   [phone] MERRILY[place])    Thank you for choosing [facility].   Please call [phone] with any questions about today's appointment.  Please arrive at least 15 minutes prior to your scheduled appointments.   If you had blood work today, I will send you a MyChart message or a letter if results  are normal. Otherwise, I will give you a call.   If you had a referral placed, they will call you to set up an appointment. Please give us  a call if you don't hear back in the next 2 weeks.   If you need additional refills before your next appointment, please call your pharmacy first.   Rollene Keeling, MD  Family Medicine

## 2023-12-12 NOTE — Progress Notes (Signed)
 SUBJECTIVE:   CHIEF COMPLAINT / HPI:   F/u hospitalization for depression and suicide attempt - still living with her daughter. Does not get along with her husband, notes that a fight with him is what led to her intentionally overdosing on trazodone . She denies SI/HI today. Notes her grandbabies are a protective factor for her.  Alcohol  use disorder- drinks one Mike's hard cranberry per day, feels this helps with her anxiety. Does not feel this contributes to her depression.  R hip pain- fell last Tuesday, felt like she pulled something, tender in her anterior right groin since. No  bulge or mass appreciated. Has been taking Tylenol  for it. Wondering if there is anything else she can take.   COPD- taking Trelegy Ellipta  daily, has not needed albuterol  often. Still smoking as below.  Tobacco use disorder- still smoking, does not want to stop. History of pulmonary nodules- needs repeat CT lung screening for previously noted nodules August 2024.  pAfib- denies palpitations, stopping taking amiodarone  as it makes her itchy. No rash or increased bleeding. Still declines anticoagulation as she does not like how it makes her bruise. Is aware of the increased risk of stroke.  HCM follow up: Mammogram due- gave number to call to schedule Pap smear due- pt declines Ct chest- needs scheduled to follow up on pulmonary nodules - scheduled in August Colonoscopy- repeat due in September Thyroid  nodule- needs US  rescheduled  PERTINENT  PMH / PSH: depression, alcohol  use disorder, paroxysmal Afib not on anticoagulation per patient choice, MDD, GAD with panic attacks, HTN, GERD, pulmonary nodules, h/o thoracic compression fracture, peripheral neuropathy, history of pleural effusion following with pulmonology  OBJECTIVE:   BP 123/76   Pulse 89   Ht 5' 4 (1.626 m)   Wt 139 lb 9.6 oz (63.3 kg)   SpO2 100%   BMI 23.96 kg/m   General: A&O, NAD HEENT: No sign of trauma, EOM grossly  intact Cardiac: RRR, no m/r/g Respiratory: CTAB, normal WOB, no w/c/r GI: Soft, NTTP, non-distended  GU: TTP over groin musculature, no hernia appreciated with valsalva, no bruising or masses Extremities: NTTP, no peripheral edema. Hips non-tender to palpation, no external rotation, no bruising over hip, legs equal lengths. Mild TTP of hip adductor tendon, without bruising or swelling or mass Neuro: walks with walker, strength 4/5 in bilateral lower extremties, sensation intact bilaterally, moves all four extremities appropriately. Psych: Appropriate mood and affect   ASSESSMENT/PLAN:   Assessment & Plan Right groin pain No sign of hernia on exam, tenderness over hip adductor tendon, as she slipped and then felt the pull will get hip Xray to ensure no fracture but low suspicion as no bruising external rotation or asymmetric weakness of hip. PRN apap for pain, PT as below Ambulatory dysfunction Amb referral for HHPT as transportation is difficult for pt, she declines further [facility] assistance H/O suicide attempt Denies SI/HI today, seeing psychiatrist monthly, notes time with grandchildren as protective factor, given number to call [facility] where she was previously seeing a therapist to re-establish with therapy Able to safety contract, has difficult living situation, discussed 24 hotline and given suicide resources Paroxysmal atrial fibrillation (HCC) Has stopped amiodarone  and anticoagulation per patient preference, asymptomatic, RRR today Chronic obstructive pulmonary disease, unspecified COPD type (HCC) Controlled continue home inhalers, still smoking, in precontemplative phase Alcohol  use disorder Still drinking, does not feel contributing to mood disorder or mental health, precontemplative Encounter for screening mammogram for breast cancer Given number to call  to schedule mammogram Pulmonary nodules/lesions, multiple Scheduled CT lung screening for follow up in  August 2025 Hip strain, initial encounter Suspect strain of adductor muscles of hip based on exam , referral to PT   Pt declined pap smear.   Karen FORBES Keeling, MD [facility]

## 2023-12-12 NOTE — Assessment & Plan Note (Signed)
 Controlled continue home inhalers, still smoking, in precontemplative phase

## 2023-12-12 NOTE — Assessment & Plan Note (Signed)
 Scheduled CT lung screening for follow up in August 2025

## 2023-12-12 NOTE — Telephone Encounter (Signed)
 Called centralized scheduling to schedule a CT Chest Low Screen per Dr. Serena request. Patient preferred sometime in the afternoon in early August.  Spoke with LaKeisha and Tillman informed me that [facility] is currently taking inpatients at this time. Was able to schedule patient for Tuesday August 5th with arrival time being at 2pm with appointment time being at 2:30 pm, location will be at [facility].  Patient was provided information about her upcoming appointment while in office today 12/12/2023.  Harlene Reiter, CMA

## 2023-12-12 NOTE — Assessment & Plan Note (Signed)
 Has stopped amiodarone  and anticoagulation per patient preference, asymptomatic, RRR today

## 2023-12-27 ENCOUNTER — Other Ambulatory Visit: Payer: Self-pay

## 2023-12-27 DIAGNOSIS — G621 Alcoholic polyneuropathy: Secondary | ICD-10-CM

## 2023-12-27 MED ORDER — GABAPENTIN 400 MG PO CAPS: 800.0000 mg | ORAL_CAPSULE | Freq: Three times a day (TID) | ORAL | 0 refills | Status: DC

## 2023-12-28 ENCOUNTER — Ambulatory Visit: Admitting: Podiatry

## 2024-01-06 ENCOUNTER — Ambulatory Visit: Admitting: Pharmacist

## 2024-01-06 ENCOUNTER — Ambulatory Visit: Admitting: Family Medicine

## 2024-01-13 ENCOUNTER — Other Ambulatory Visit: Payer: Self-pay

## 2024-01-13 NOTE — Patient Outreach (Signed)
 Complex Care Management   Visit Note  01/13/2024  Name:  Karen Casey MRN: [id number] DOB: 09/18/1955  Situation: Referral received for Complex Care Management related to SDOH Barriers:  Food insecurity I obtained verbal consent from Patient.  Visit completed with patient  on the phone  Background:   Past Medical History:  Diagnosis Date   Alcohol  abuse    last use 03/09/21, marijuana last 03/09/21   Allergy    Anxiety    Cataract 06/09/2012   Right eye and left eye   COPD (chronic obstructive pulmonary disease) (HCC)    Depression    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/04/2020   Due to benzodiazepine withdrawal   Dyspnea    uses oxygen  2L via [place] prn   Enterococcal bacteremia 02/10/2022   GERD (gastroesophageal reflux disease)    Headache    Hypertension    Long term (current) use of anticoagulants    Nausea vomiting and diarrhea 02/09/2022   Neuromuscular disorder (HCC)    Neuropathy    Paroxysmal atrial fibrillation (HCC)    Positive blood culture 02/10/2022   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 No resources are needed at this time.  SDOH Interventions    Flowsheet Row Telephone from 11/24/2023 in [place] POPULATION HEALTH DEPARTMENT Office Visit from 10/31/2023 in [facility] Of [place]. [facility] Telephone from 10/26/2023 in [place] POPULATION HEALTH DEPARTMENT Office Visit from 04/15/2023 in [facility] - A Dept Of [place]. [facility] Telephone from 04/12/2023 in [facility] Community Care Coordination ED to Hosp-Admission (Discharged) from 01/31/2023 in [place] LONG 4TH FLOOR PROGRESSIVE CARE AND UROLOGY  SDOH Interventions        Food  Insecurity Interventions -- -- Patient Declined -- Intervention Not Indicated --  Housing Interventions -- -- Other (Comment)  [Patient stated she is currently staying with her daughter and is on the wait list for public housing.] -- Intervention Not Indicated --  Transportation Interventions -- -- Other (Comment)  [Patient stated she is interested in receiving information about [facility] transportation. She requested that I leave the number on her voicemail as well.] -- -- Inpatient TOC, Patient Resources (Friends/Family)  Utilities Interventions -- -- -- -- Intervention Not Indicated --  Depression Interventions/Treatment  -- Medication, Counseling, Currently on Treatment -- Medication, Counseling, Currently on Treatment -- --  Financial Strain Interventions Community Resources Provided -- -- -- -- --      Recommendation:   No recommendations at this time.  Follow Up Plan:   Patient has met all care management goals. Care Management case will be closed. Patient has been provided contact information should new needs arise.   Thersia Hoar, HEDWIG, MHA [place]  [facility] Social Worker, Population Health [phone]

## 2024-01-13 NOTE — Patient Instructions (Signed)

## 2024-01-17 ENCOUNTER — Ambulatory Visit (HOSPITAL_COMMUNITY): Attending: Family Medicine

## 2024-01-19 ENCOUNTER — Other Ambulatory Visit: Payer: Self-pay

## 2024-01-19 DIAGNOSIS — R110 Nausea: Secondary | ICD-10-CM

## 2024-01-19 MED ORDER — ONDANSETRON 4 MG PO TBDP
4.0000 mg | ORAL_TABLET | Freq: Three times a day (TID) | ORAL | 0 refills | Status: AC | PRN
Start: 2024-01-19 — End: ?

## 2024-01-30 ENCOUNTER — Ambulatory Visit: Admitting: Family Medicine

## 2024-02-03 ENCOUNTER — Ambulatory Visit: Admitting: Family Medicine

## 2024-02-03 ENCOUNTER — Ambulatory Visit: Admitting: Pharmacist

## 2024-02-05 DIAGNOSIS — R531 Weakness: Secondary | ICD-10-CM | POA: Diagnosis not present

## 2024-02-05 DIAGNOSIS — Z87891 Personal history of nicotine dependence: Secondary | ICD-10-CM | POA: Diagnosis not present

## 2024-02-05 DIAGNOSIS — J984 Other disorders of lung: Secondary | ICD-10-CM | POA: Diagnosis not present

## 2024-02-05 DIAGNOSIS — T1491XA Suicide attempt, initial encounter: Secondary | ICD-10-CM | POA: Diagnosis not present

## 2024-02-05 DIAGNOSIS — R45851 Suicidal ideations: Secondary | ICD-10-CM | POA: Diagnosis not present

## 2024-02-05 DIAGNOSIS — T424X2A Poisoning by benzodiazepines, intentional self-harm, initial encounter: Secondary | ICD-10-CM | POA: Diagnosis not present

## 2024-02-05 DIAGNOSIS — F329 Major depressive disorder, single episode, unspecified: Secondary | ICD-10-CM | POA: Diagnosis not present

## 2024-02-05 DIAGNOSIS — R410 Disorientation, unspecified: Secondary | ICD-10-CM | POA: Diagnosis not present

## 2024-02-06 DIAGNOSIS — J9601 Acute respiratory failure with hypoxia: Secondary | ICD-10-CM | POA: Diagnosis not present

## 2024-02-06 DIAGNOSIS — G629 Polyneuropathy, unspecified: Secondary | ICD-10-CM | POA: Diagnosis not present

## 2024-02-06 DIAGNOSIS — I480 Paroxysmal atrial fibrillation: Secondary | ICD-10-CM | POA: Diagnosis not present

## 2024-02-06 DIAGNOSIS — G8929 Other chronic pain: Secondary | ICD-10-CM | POA: Diagnosis not present

## 2024-02-06 DIAGNOSIS — F10188 Alcohol abuse with other alcohol-induced disorder: Secondary | ICD-10-CM | POA: Diagnosis not present

## 2024-02-06 DIAGNOSIS — G479 Sleep disorder, unspecified: Secondary | ICD-10-CM | POA: Diagnosis not present

## 2024-02-06 DIAGNOSIS — T1491XA Suicide attempt, initial encounter: Secondary | ICD-10-CM | POA: Diagnosis not present

## 2024-02-06 DIAGNOSIS — F329 Major depressive disorder, single episode, unspecified: Secondary | ICD-10-CM | POA: Diagnosis not present

## 2024-02-06 DIAGNOSIS — Z79899 Other long term (current) drug therapy: Secondary | ICD-10-CM | POA: Diagnosis not present

## 2024-02-06 DIAGNOSIS — F411 Generalized anxiety disorder: Secondary | ICD-10-CM | POA: Diagnosis not present

## 2024-02-06 DIAGNOSIS — T424X2A Poisoning by benzodiazepines, intentional self-harm, initial encounter: Secondary | ICD-10-CM | POA: Diagnosis not present

## 2024-02-06 DIAGNOSIS — Z9151 Personal history of suicidal behavior: Secondary | ICD-10-CM | POA: Diagnosis not present

## 2024-02-06 DIAGNOSIS — E876 Hypokalemia: Secondary | ICD-10-CM | POA: Diagnosis not present

## 2024-02-06 DIAGNOSIS — J984 Other disorders of lung: Secondary | ICD-10-CM | POA: Diagnosis not present

## 2024-02-06 DIAGNOSIS — D649 Anemia, unspecified: Secondary | ICD-10-CM | POA: Diagnosis not present

## 2024-02-06 DIAGNOSIS — Z6379 Other stressful life events affecting family and household: Secondary | ICD-10-CM | POA: Diagnosis not present

## 2024-02-06 DIAGNOSIS — F1010 Alcohol abuse, uncomplicated: Secondary | ICD-10-CM | POA: Diagnosis not present

## 2024-02-06 DIAGNOSIS — F332 Major depressive disorder, recurrent severe without psychotic features: Secondary | ICD-10-CM | POA: Diagnosis not present

## 2024-02-06 DIAGNOSIS — Z87891 Personal history of nicotine dependence: Secondary | ICD-10-CM | POA: Diagnosis not present

## 2024-02-06 DIAGNOSIS — I10 Essential (primary) hypertension: Secondary | ICD-10-CM | POA: Diagnosis not present

## 2024-02-06 DIAGNOSIS — R32 Unspecified urinary incontinence: Secondary | ICD-10-CM | POA: Diagnosis not present

## 2024-02-06 DIAGNOSIS — Z743 Need for continuous supervision: Secondary | ICD-10-CM | POA: Diagnosis not present

## 2024-02-06 DIAGNOSIS — F32A Depression, unspecified: Secondary | ICD-10-CM | POA: Diagnosis not present

## 2024-02-06 DIAGNOSIS — Z5986 Financial insecurity: Secondary | ICD-10-CM | POA: Diagnosis not present

## 2024-02-06 DIAGNOSIS — K219 Gastro-esophageal reflux disease without esophagitis: Secondary | ICD-10-CM | POA: Diagnosis not present

## 2024-02-06 DIAGNOSIS — Z5941 Food insecurity: Secondary | ICD-10-CM | POA: Diagnosis not present

## 2024-02-06 DIAGNOSIS — T510X2A Toxic effect of ethanol, intentional self-harm, initial encounter: Secondary | ICD-10-CM | POA: Diagnosis not present

## 2024-02-06 DIAGNOSIS — Z751 Person awaiting admission to adequate facility elsewhere: Secondary | ICD-10-CM | POA: Diagnosis not present

## 2024-02-06 DIAGNOSIS — J449 Chronic obstructive pulmonary disease, unspecified: Secondary | ICD-10-CM | POA: Diagnosis not present

## 2024-02-06 DIAGNOSIS — R531 Weakness: Secondary | ICD-10-CM | POA: Diagnosis not present

## 2024-02-06 DIAGNOSIS — F17210 Nicotine dependence, cigarettes, uncomplicated: Secondary | ICD-10-CM | POA: Diagnosis not present

## 2024-02-07 DIAGNOSIS — G629 Polyneuropathy, unspecified: Secondary | ICD-10-CM | POA: Diagnosis not present

## 2024-02-07 DIAGNOSIS — F419 Anxiety disorder, unspecified: Secondary | ICD-10-CM | POA: Diagnosis not present

## 2024-02-07 DIAGNOSIS — T1491XA Suicide attempt, initial encounter: Secondary | ICD-10-CM | POA: Diagnosis not present

## 2024-02-07 DIAGNOSIS — F333 Major depressive disorder, recurrent, severe with psychotic symptoms: Secondary | ICD-10-CM | POA: Diagnosis not present

## 2024-02-07 DIAGNOSIS — Z634 Disappearance and death of family member: Secondary | ICD-10-CM | POA: Diagnosis not present

## 2024-02-07 DIAGNOSIS — F17210 Nicotine dependence, cigarettes, uncomplicated: Secondary | ICD-10-CM | POA: Diagnosis not present

## 2024-02-07 DIAGNOSIS — J449 Chronic obstructive pulmonary disease, unspecified: Secondary | ICD-10-CM | POA: Diagnosis not present

## 2024-02-07 DIAGNOSIS — I480 Paroxysmal atrial fibrillation: Secondary | ICD-10-CM | POA: Diagnosis not present

## 2024-02-07 DIAGNOSIS — K219 Gastro-esophageal reflux disease without esophagitis: Secondary | ICD-10-CM | POA: Diagnosis not present

## 2024-02-07 DIAGNOSIS — E559 Vitamin D deficiency, unspecified: Secondary | ICD-10-CM | POA: Diagnosis not present

## 2024-02-07 DIAGNOSIS — Z638 Other specified problems related to primary support group: Secondary | ICD-10-CM | POA: Diagnosis not present

## 2024-02-07 DIAGNOSIS — R32 Unspecified urinary incontinence: Secondary | ICD-10-CM | POA: Diagnosis not present

## 2024-02-07 DIAGNOSIS — F332 Major depressive disorder, recurrent severe without psychotic features: Secondary | ICD-10-CM | POA: Diagnosis not present

## 2024-02-07 DIAGNOSIS — F1010 Alcohol abuse, uncomplicated: Secondary | ICD-10-CM | POA: Diagnosis not present

## 2024-02-07 DIAGNOSIS — Z9151 Personal history of suicidal behavior: Secondary | ICD-10-CM | POA: Diagnosis not present

## 2024-02-07 DIAGNOSIS — I10 Essential (primary) hypertension: Secondary | ICD-10-CM | POA: Diagnosis not present

## 2024-02-07 DIAGNOSIS — Z79899 Other long term (current) drug therapy: Secondary | ICD-10-CM | POA: Diagnosis not present

## 2024-02-07 DIAGNOSIS — T424X2A Poisoning by benzodiazepines, intentional self-harm, initial encounter: Secondary | ICD-10-CM | POA: Diagnosis not present

## 2024-02-08 DIAGNOSIS — F332 Major depressive disorder, recurrent severe without psychotic features: Secondary | ICD-10-CM | POA: Diagnosis not present

## 2024-02-08 DIAGNOSIS — F1010 Alcohol abuse, uncomplicated: Secondary | ICD-10-CM | POA: Diagnosis not present

## 2024-02-09 DIAGNOSIS — F17210 Nicotine dependence, cigarettes, uncomplicated: Secondary | ICD-10-CM | POA: Diagnosis not present

## 2024-02-09 DIAGNOSIS — I10 Essential (primary) hypertension: Secondary | ICD-10-CM | POA: Diagnosis not present

## 2024-02-09 DIAGNOSIS — J449 Chronic obstructive pulmonary disease, unspecified: Secondary | ICD-10-CM | POA: Diagnosis not present

## 2024-02-09 DIAGNOSIS — K219 Gastro-esophageal reflux disease without esophagitis: Secondary | ICD-10-CM | POA: Diagnosis not present

## 2024-02-09 DIAGNOSIS — F1090 Alcohol use, unspecified, uncomplicated: Secondary | ICD-10-CM | POA: Diagnosis not present

## 2024-02-09 DIAGNOSIS — R9431 Abnormal electrocardiogram [ECG] [EKG]: Secondary | ICD-10-CM | POA: Diagnosis not present

## 2024-02-09 DIAGNOSIS — F332 Major depressive disorder, recurrent severe without psychotic features: Secondary | ICD-10-CM | POA: Diagnosis not present

## 2024-02-09 DIAGNOSIS — I480 Paroxysmal atrial fibrillation: Secondary | ICD-10-CM | POA: Diagnosis not present

## 2024-02-09 DIAGNOSIS — G629 Polyneuropathy, unspecified: Secondary | ICD-10-CM | POA: Diagnosis not present

## 2024-02-09 DIAGNOSIS — Z87898 Personal history of other specified conditions: Secondary | ICD-10-CM | POA: Diagnosis not present

## 2024-02-11 DIAGNOSIS — F1010 Alcohol abuse, uncomplicated: Secondary | ICD-10-CM | POA: Diagnosis not present

## 2024-02-11 DIAGNOSIS — F1090 Alcohol use, unspecified, uncomplicated: Secondary | ICD-10-CM | POA: Diagnosis not present

## 2024-02-11 DIAGNOSIS — R9431 Abnormal electrocardiogram [ECG] [EKG]: Secondary | ICD-10-CM | POA: Diagnosis not present

## 2024-02-11 DIAGNOSIS — I10 Essential (primary) hypertension: Secondary | ICD-10-CM | POA: Diagnosis not present

## 2024-02-11 DIAGNOSIS — I480 Paroxysmal atrial fibrillation: Secondary | ICD-10-CM | POA: Diagnosis not present

## 2024-02-11 DIAGNOSIS — Z87898 Personal history of other specified conditions: Secondary | ICD-10-CM | POA: Diagnosis not present

## 2024-02-11 DIAGNOSIS — G629 Polyneuropathy, unspecified: Secondary | ICD-10-CM | POA: Diagnosis not present

## 2024-02-11 DIAGNOSIS — F332 Major depressive disorder, recurrent severe without psychotic features: Secondary | ICD-10-CM | POA: Diagnosis not present

## 2024-02-11 DIAGNOSIS — J449 Chronic obstructive pulmonary disease, unspecified: Secondary | ICD-10-CM | POA: Diagnosis not present

## 2024-02-11 DIAGNOSIS — K219 Gastro-esophageal reflux disease without esophagitis: Secondary | ICD-10-CM | POA: Diagnosis not present

## 2024-02-11 DIAGNOSIS — F17210 Nicotine dependence, cigarettes, uncomplicated: Secondary | ICD-10-CM | POA: Diagnosis not present

## 2024-02-12 DIAGNOSIS — F1010 Alcohol abuse, uncomplicated: Secondary | ICD-10-CM | POA: Diagnosis not present

## 2024-02-12 DIAGNOSIS — F332 Major depressive disorder, recurrent severe without psychotic features: Secondary | ICD-10-CM | POA: Diagnosis not present

## 2024-02-13 DIAGNOSIS — F332 Major depressive disorder, recurrent severe without psychotic features: Secondary | ICD-10-CM | POA: Diagnosis not present

## 2024-02-13 DIAGNOSIS — F1010 Alcohol abuse, uncomplicated: Secondary | ICD-10-CM | POA: Diagnosis not present

## 2024-02-14 DIAGNOSIS — F332 Major depressive disorder, recurrent severe without psychotic features: Secondary | ICD-10-CM | POA: Diagnosis not present

## 2024-02-14 DIAGNOSIS — F1010 Alcohol abuse, uncomplicated: Secondary | ICD-10-CM | POA: Diagnosis not present

## 2024-02-14 NOTE — Nursing Note (Signed)
   02/13/24 2300  Purposeful Rounding  Purposeful Rounding Yes  Affect/Mood  Affect Range /Display normal range  Mood Range /Display Normal range  Affect/Mood Display Calm  Mood guarded  Psychosis / Thought Content  Delusions No Delusions  Hallucinations None  Ambivalence No (Comment)  Confusion No (Comment)  Disorganization No (Comment)  MOAS+  Verbal Aggression 0  Aggression against Property 0  Aggression against Self 0  Sexual Aggression 0  Aggression against Others 0  MOAS+ Score 0  Behavior  Eye Contact Good  Exhibited Behavior cooperative  Language and Speech  Speech Content Appropriate     Pt was in the day area interacting with her peers at the beginning of the shift. Pt alert and oriented x 4. Up ad lib with a walker. Pt calm and cooperative with care and compliant with bedtime medication. Denied SI/HI/AVH. No unsafe behavior noted. No prn administered. LBM 9/1. Pt slept for 7 hours.

## 2024-02-14 NOTE — Group Note (Signed)
  Group Therapy Note   Group Date/Time: 02/14/2024 1330 - 1430 Group Topic:  BH Psychotherapy  Group Department: [facility] Behavioral Health Unit  Group Facilitator: Kristie Ly, MSW LCSW  Group Subtopic :Positive Thinking  Patient Problem or Treatment Goal Addressed: Ineffective Coping and effective coping   Teaching Methods/Interventions: Encouraged, Explained, Group Discussion, and Written Material Given  Audio Visual Played (if applicable): N/A  Written Material Given (if applicable): healthy Vs unhealthy coping strategies.   Behavior: Calm  Affect/Mood: Flat/Blunted  Thoughts: Clear  Readiness to Learn/Patient Participation Level: Active and Listened Attentively  Attention Span: Alert  Barriers to Learning: None=no barriers  Patient/Family Response: Verbalizes Understanding of Information  Care Plan Updated?  No The coping skills group focused on helping pts identify healthy coping strategies, understand the difference between positive and negative coping methods, and practice new techniques to manage stress. The group began with introductions and a brief check-in where pts shared one coping strategy they currently use, pt shared "I just close my eyes and pretend I'm in another place". SW led a discussion on the impact of healthy versus unhealthy coping, and pts engaged in a coping skills inventory activity where they identified strategies they currently use and those they would like to try. The group practiced a grounding and deep breathing exercise together, and pts reflected on how it made them feel. The session concluded with each member sharing one coping skill they plan to practice outside of group, the pt reported she like to talk about her problems and eat healthier foods. Pt was attentive, engaged, and able to identify ways to strengthen their coping skills. Electronically signed: Kristie Ly, MSW LCSW 02/14/2024 / 4:21 PM

## 2024-02-15 ENCOUNTER — Telehealth: Payer: Self-pay

## 2024-02-15 NOTE — Transitions of Care (Post Inpatient/ED Visit) (Signed)
   02/15/2024  Name: Karen Casey MRN: [id number] DOB: 1955-07-12  Today's TOC FU Call Status: Today's TOC FU Call Status:: Unsuccessful Call (1st Attempt) Unsuccessful Call (1st Attempt) Date: 02/15/24  Attempted to reach the patient regarding the most recent Inpatient/ED visit. RN CM left confidential voice mail with call back number after identification.   Follow Up Plan: Additional outreach attempts will be made to reach the patient to complete the Transitions of Care (Post Inpatient/ED visit) call.    Bing Edison MSN, RN RN Case Sales executive Health  [facility]-Population Health Office Hours M-F [phone] Direct Dial: [phone] Main Phone [phone]  Fax: [phone] [email or web address]

## 2024-02-16 ENCOUNTER — Telehealth: Payer: Self-pay

## 2024-02-16 NOTE — Transitions of Care (Post Inpatient/ED Visit) (Signed)
   02/16/2024  Name: Karen Casey MRN: [id number] DOB: 04/19/1956  Today's TOC FU Call Status: Today's TOC FU Call Status:: Unsuccessful Call (2nd Attempt) Unsuccessful Call (2nd Attempt) Date: 02/16/24  Attempted to reach the patient regarding the most recent Inpatient/ED visit. RN CM left confidential voice mail on designated phone number with RN CM call back number and identification.   Follow Up Plan: Additional outreach attempts will be made to reach the patient to complete the Transitions of Care (Post Inpatient/ED visit) call.    Bing Edison MSN, RN RN Case Sales executive Health  [facility]BC[facility]-Population Health Office Hours M-F [phone] Direct Dial: [phone] Main Phone [phone]  Fax: [phone] [email or web address]

## 2024-02-17 ENCOUNTER — Telehealth: Payer: Self-pay

## 2024-02-17 ENCOUNTER — Ambulatory Visit: Admitting: Family Medicine

## 2024-02-17 NOTE — Transitions of Care (Post Inpatient/ED Visit) (Signed)
   02/17/2024  Name: Karen Casey MRN: [id number] DOB: 29-Aug-1955  Today's TOC FU Call Status: Today's TOC FU Call Status:: Unsuccessful Call (3rd Attempt) Unsuccessful Call (3rd Attempt) Date: 02/17/24  Attempted to reach the patient regarding the most recent Inpatient/ED visit.  Follow Up Plan: No further outreach attempts will be made at this time. We have been unable to contact the patient.  Alan Ee, RN, BSN, CEN [employer]- Transition of Care Team.  [facility] [phone]

## 2024-02-17 NOTE — Progress Notes (Deleted)
    SUBJECTIVE:   CHIEF COMPLAINT / HPI:   Discussed the use of AI scribe software for clinical note transcription with the patient, who gave verbal consent to proceed.  History of Present Illness      PERTINENT  PMH / PSH: ***  OBJECTIVE:   There were no vitals taken for this visit.  Physical Exam    ASSESSMENT/PLAN:   Assessment & Plan    Assessment and Plan Assessment & Plan       Rollene FORBES Keeling, MD [facility]

## 2024-02-21 ENCOUNTER — Telehealth: Payer: Self-pay | Admitting: Family Medicine

## 2024-02-21 ENCOUNTER — Other Ambulatory Visit: Payer: Self-pay

## 2024-02-21 DIAGNOSIS — G621 Alcoholic polyneuropathy: Secondary | ICD-10-CM

## 2024-02-21 NOTE — Telephone Encounter (Signed)
 Patient calls nurse line requesting (3) medications.   She reports she needs a refill on her Gabapentin .   She reports she no longer has a Merchandiser, retail. She reports they parted ways during her recent hospitalization.   With that said, she reports she needs a referral to behavorial health and also a refill on Trazodone  and Clonazepam .   Advised will forward to PCP for review.   She has an upcoming apt with PCP on 9/15.

## 2024-02-21 NOTE — Telephone Encounter (Signed)
 Called pt and explained that I do not prescribe her clonazepam  and have not for the past year and would not be able to provide refills. Discussed risk of withdrawal and recommended that she call her psychiatrist's office and they will provide her a taper of this medication. Discussed it was life threatening to withdrawal from benzodiazepines and I recommended presenting to the hospital as she has had seizures in the past if she does not get a refill from her psychiatrist. She notes she has not had any in the past week and I recommended calling 911 and going to the ED, which she is agreeable to. She states her psychiatrist is Dr Florencia Cousin NP in [place], and states she will try calling this office. Also states she does not want to die and will call 911. She had recent intentional overdose of clonazepam  for SI and she states awareness of this and I explained this is also why I do not want to continue this medication for her. I offered to call 911 for her and she notes she will call them after this call.  Rollene Keeling MD

## 2024-02-23 ENCOUNTER — Ambulatory Visit: Payer: Self-pay | Admitting: Nurse Practitioner

## 2024-02-27 ENCOUNTER — Encounter: Payer: Self-pay | Admitting: Family Medicine

## 2024-02-27 ENCOUNTER — Ambulatory Visit (INDEPENDENT_AMBULATORY_CARE_PROVIDER_SITE_OTHER): Admitting: Family Medicine

## 2024-02-27 VITALS — BP 132/79 | HR 69 | Ht 64.0 in | Wt 136.2 lb

## 2024-02-27 DIAGNOSIS — G621 Alcoholic polyneuropathy: Secondary | ICD-10-CM

## 2024-02-27 DIAGNOSIS — F332 Major depressive disorder, recurrent severe without psychotic features: Secondary | ICD-10-CM | POA: Diagnosis not present

## 2024-02-27 DIAGNOSIS — K219 Gastro-esophageal reflux disease without esophagitis: Secondary | ICD-10-CM

## 2024-02-27 DIAGNOSIS — J438 Other emphysema: Secondary | ICD-10-CM | POA: Diagnosis not present

## 2024-02-27 MED ORDER — BUSPIRONE HCL 5 MG PO TABS: 10.0000 mg | ORAL_TABLET | Freq: Two times a day (BID) | ORAL | 0 refills | Status: AC

## 2024-02-27 MED ORDER — TRAZODONE HCL 50 MG PO TABS: 50.0000 mg | ORAL_TABLET | Freq: Every evening | ORAL | 0 refills | Status: DC | PRN

## 2024-02-27 MED ORDER — TRELEGY ELLIPTA 100-62.5-25 MCG/ACT IN AEPB: 1.0000 | INHALATION_SPRAY | Freq: Every day | RESPIRATORY_TRACT | 2 refills | Status: DC

## 2024-02-27 MED ORDER — GABAPENTIN 400 MG PO CAPS
800.0000 mg | ORAL_CAPSULE | Freq: Three times a day (TID) | ORAL | 0 refills | Status: DC
Start: 2024-02-27 — End: 2024-04-09

## 2024-02-27 MED ORDER — PANTOPRAZOLE SODIUM 40 MG PO TBEC: 40.0000 mg | DELAYED_RELEASE_TABLET | Freq: Every day | ORAL | 3 refills | Status: AC

## 2024-02-27 NOTE — Progress Notes (Signed)
 SUBJECTIVE:   CHIEF COMPLAINT / HPI:   Discussed the use of AI scribe software for clinical note transcription with the patient, who gave verbal consent to proceed.  History of Present Illness Karen Casey is a 68 year old female who presents with symptoms of a cold and medication management issues.  Upper respiratory symptoms - Severe cold symptoms since last Thursday, had some headaches - Sensation of wax in right ear, was having ear pain but now resolved - No fever - Coughing less frequent with yellow sputum - No hemoptysis - Uses emergency inhaler for shortness of breath - No dyspnea when using walker - has been using her albuterol  inhaler, has not been using her Trelegy and needs a refill  Anxiety and depression - Has not taken clonazepam  since hospital discharge due to prescription issues about two weeks ago- has called psychiatrists office and has virtual meeting on Thursday to discuss further prescriptions, would like a refill of trazodone  and buspar  while waiting, has her lexapro  and has been taking, has had poor sleep since out of her trazodone  - denies tremors, seizures, has been only drinking two beers occassionally when stressed - having some vivid dreams  GERD- taking her protonix , requests refill Peripheral neuropathy- taking gabapentin  800mg  three times daily needs refill  History of suicide attempts - denies SI today, denies plan or intent, notes fear of religious consequences of suicide - is able to safety contract, will call 911 - has appt with psychiatrist on Thursday to discuss medications - Not seeing a counselor, Negative experiences with therapists  Substance use - Smokes over a pack of cigarettes daily - Increased smoking attributed to stress and lack of medication - No regular alcohol  use - Consumed two beers recently during stress     PERTINENT  PMH / PSH: depression, h/o suicide attempt x2 most recently clonazepam  overdose in August 2025,  alcohol  use disorder, osteoporosis with history of thoracic compression fracture, SIADH  OBJECTIVE:   BP 132/79   Pulse 69   Ht 5' 4 (1.626 m)   Wt 136 lb 3.2 oz (61.8 kg)   SpO2 100%   BMI 23.38 kg/m   Physical Exam General: A&O, NAD HEENT: No sign of trauma, EOM grossly intact, TM clear bilaterally without effusion Cardiac: RRR, no m/r/g Respiratory: CTAB, normal WOB, occasional expiratory wheeze and cough in the room GI: Soft, NTTP, non-distended  Extremities: NTTP, no peripheral edema. Neuro: walks with walker, moves all four extremities appropriately. Psych: Appropriate mood and affect   ASSESSMENT/PLAN:    Assessment & Plan Major depressive disorder with previous suicidal ideation and h/o suicide attempt Ongoing depression with feelings of hopelessness, lack of motivation, poor sleep and vivid dream. Denied current suicidal ideation but acknowledged past attempts. Stress related to family dynamics and living situation. Issues with current psychiatrist and not seeing a counselor. - Refilled trazodone  for sleep until psychiatry appt on Thursday - Referred to a new psychiatrist. - Encouraged follow-up with current psychiatrist- was previously on benzodiazepines but weaned during recent hospitalization and has been off them since two weeks ago, reassuringly without seizures or other withdrawal symptoms - Discussed therapy and coping skills. - Provided information on hotlines 988 and behavioral health resources. Pt was able to safety contract that she would call 911 which she has done in the past. Encouraged her to keep her psychiatrist appt on Thursday - again discussed why I am unable to prescribe benzodiazepines for her which she was in agreement  Alcohol   use disorder, in early remission Maintaining early remission with recent consumption of two beers due to stress. Acknowledged stress as a trigger for alcohol  use.   Chronic obstructive pulmonary disease with acute  exacerbation Increased cough and sputum production, likely a COPD flare. Increased smoking due to stress. Wheezing on examination. Declined steroids due to potential anxiety exacerbation. - Refilled Trelegy Ellipta . - Encouraged daily use of Trelegy Ellipta . - Considered steroids if symptoms worsen. - Considered chest x-ray if respiratory symptoms worsen- offered today and pt declined  Tobacco use disorder Smoking over a pack a day, increased due to stress and lack of medication. Discussed impact on respiratory symptoms.  Gastroesophageal reflux disease Ongoing nausea and stomach growling. - Refilled Protonix .      Rollene FORBES Keeling, MD [facility]

## 2024-02-27 NOTE — Patient Instructions (Addendum)
 It was wonderful to see you today.  Please bring ALL of your medications with you to every visit.   Today we talked about:  - I refilled your buspirone , trazodone  to get you to your psychiatrist appt  Refilled your Trelegy. If your coughing gets worse, let us  know about steroids and a chest x-ray.  If you are feeling suicidal or depression symptoms worsen please immediately go to:   If you are thinking about harming yourself or having thoughts of suicide, or if you know someone who is, seek help right away. If you are in crisis, make sure you are not left alone.  If someone else is in crisis, make sure he/she/they is not left alone  Call 988 OR 1-800-273-TALK  24 Hour Availability for Walk-IN services  [facility]  [address] [place], [place] [phone] Crisis [phone]    Other crisis resources:  [facility] [employer] (Domestic Violence, Rape & Victim Assistance [phone]  [facility] [employer] [facility]    (ONLY from 8am-4pm)    [phone]  [facility] Crisis [facility] (24/7)   [phone]  [place]  National Suicide Hotline   [phone] MERRILYN)   Thank you for choosing [facility].   Please call [phone] with any questions about today's appointment.  Please arrive at least 15 minutes prior to your scheduled appointments.   If you had blood work today, I will send you a MyChart message or a letter if results are normal. Otherwise, I will give you a call.   If you had a referral placed, they will call you to set up an appointment. Please give us  a call if you don't hear back in the next 2 weeks.   If you need additional refills before your next appointment, please call your pharmacy first.  Don't forget to check out the [facility] in the [facility] at [address] [phone] Affordable prices on prescriptions and  over-the-counter items, as well as services like vaccinations and medication home delivery.   Rollene Keeling, MD  Family Medicine

## 2024-03-01 ENCOUNTER — Telehealth: Payer: Self-pay | Admitting: Nurse Practitioner

## 2024-03-22 ENCOUNTER — Encounter

## 2024-03-28 DIAGNOSIS — F17210 Nicotine dependence, cigarettes, uncomplicated: Secondary | ICD-10-CM | POA: Diagnosis not present

## 2024-03-28 DIAGNOSIS — I10 Essential (primary) hypertension: Secondary | ICD-10-CM | POA: Diagnosis not present

## 2024-03-28 DIAGNOSIS — F1090 Alcohol use, unspecified, uncomplicated: Secondary | ICD-10-CM | POA: Diagnosis not present

## 2024-03-28 DIAGNOSIS — R9431 Abnormal electrocardiogram [ECG] [EKG]: Secondary | ICD-10-CM | POA: Diagnosis not present

## 2024-03-28 DIAGNOSIS — E871 Hypo-osmolality and hyponatremia: Secondary | ICD-10-CM | POA: Diagnosis not present

## 2024-03-28 DIAGNOSIS — R45851 Suicidal ideations: Secondary | ICD-10-CM | POA: Diagnosis not present

## 2024-03-28 DIAGNOSIS — I480 Paroxysmal atrial fibrillation: Secondary | ICD-10-CM | POA: Diagnosis not present

## 2024-03-28 DIAGNOSIS — F10220 Alcohol dependence with intoxication, uncomplicated: Secondary | ICD-10-CM | POA: Diagnosis not present

## 2024-03-28 DIAGNOSIS — F411 Generalized anxiety disorder: Secondary | ICD-10-CM | POA: Diagnosis not present

## 2024-03-28 DIAGNOSIS — E876 Hypokalemia: Secondary | ICD-10-CM | POA: Diagnosis not present

## 2024-03-28 DIAGNOSIS — F332 Major depressive disorder, recurrent severe without psychotic features: Secondary | ICD-10-CM | POA: Diagnosis not present

## 2024-03-28 NOTE — ED Notes (Signed)
 BH cart at bedside. Patient provided with meal tray as well. No further needs voiced at this time.

## 2024-03-28 NOTE — Progress Notes (Signed)
 [facility] Psych[facility] -Behavioral Health Diagnostic Evaluation Tele-Behavioral Heath Diagnostic Evaluation - Visit was conducted with the use of interactive audio and video telecommunications systems that permits real time communication between provider and patient   Patient location at time of Tele-Consult: [facility] Provider Location at time of Tele-Consult: Remote  Patient Name:  Karen Casey Date of Birth:  1955-11-18  Today's Date:  March 28, 2024  Today's encounter included additional complexity due to: chart review  [place] Safety planning intervention was Updated during the visit. Total time spent on safety planning was 6 minutes.   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 female with a history of Depression and Alcohol  Abuse and Anxiety who presented to the Patient location at time of Tele-Consult: [facility]. Means at which patient arrived to ED:: EMS. Patient is voluntarily and presents in ED due to per triage notes- Pt reports I wish I was dead. Pt reports ETOH 3 buds ice and 1/2 Mikes Pt reports I have to beg for food because we don't have food. Pt denies plan.  ED provider placed on IVC.  Patient reports on interview that she called 911 today bc she was having thoughts to end her life, pt reports 3 attempts to end her life this year already by overdosing and she has been inpt twice at [facility] this year. Pt admits to daily alcohol  use drink 1-15 beers per pt. Pt reports ongoing hopelessness and stated I am very depressed. Pt stated that she has had poor sleep, poor appetite, she is drinking daily and having nightmares. Pt stated her husband died in 06-28-2022 and she has  nothing to live for now.  Pt stated that she lives with her daughter and she could not care less about living. Pt stated that she is on disability and called for help today bc she feels that she needs inpt behavioral health treatment.   This Clinical research associate called pts daughter Alan and left message re; collateral. [phone].  Based on my assessment, the recommendation is for inpatient level of care. BH Consult reviewed with ED provider, Alyce Mater, PA-C, who was informed of the disposition plan and is in agreement with the plan. ED provider confirms patient's commitment status at present to be placed under IVC by the ED due to risk of harm to self/others. Based on the [facility] Consult findings, the patient's commitment status will be upheld. Next steps will include referral to centralized bed management for IP bed placement. Legal status/disposition recommendation was reviewed with the patient yes.  Disposition  ED Provider Contact Name: Alyce Mater, PA-C MD Contact Date: 03/28/24 Disposition Recommendation: Inpatient Admission Admission Type: Placed on Involuntary Commitment by NH Date of Affidavit: 03/28/24 Disposition Comments: Pt will referred for inpt tx. ED provider placed pt on IVC. Individual to contact with disposition updates (name and number): Karen Casey daughter [phone]     Triage Screen  ED Triage Access Screening : The patient is not exhibiting and/or reporting manic behaviors.;The patient does not present with psychotic symptoms, hallucinations, delusions, and/or paranoia.;The patient did not arrive under IVC or TDO.   General Information  Type of Screen: If NOT Face to Face, Skip to Disposition Section): Face to Face Means at which patient arrived to ED:: EMS Referral Source Name and Contact  Number: EMS drove pt to ED Release Signed: No Referral Source Contacted: No Release for Community Providers: No Information Provided By:: Patient;Family;Other: (EMR) Court  Appointed Guardian: No What brought the patient to the ED/circumstances prompting assessment: Karen Casey is a 68 yr old female who was brought to the ED by EMS after pt called for help stating that she was having SI and wishes I was dead. pt reports three past attempts to end her life in the last year and she stated that she does not feel safe to dc home. Pt reports she hates living with her daughter and she has been increasingly depressed since her daughter died. Presenting Problems: Depression Duration of Presenting Problems: Other (Comment) (ongoing) Outside help or community services at home: None Is there anyone that you know, or are related to, on the Behavioral Health unit?: No Have you ever been in the Service?: No  Potential Risk to Self  Suicidal Thoughts: Continuous and/or contain specific plan and/or stated intent (abnormal Suicide Plan: Specific realistic plan with available means (abnormal) Suicidal Intent: Active intent to develop and/or carry out plan (abnormal) History of Suicide Attempts: One or more serious attempts more than 12 months ago, or >2 low lethality attempts or non-suicidal self-injurious acts in past year Lethality of Past Self-Injurious Behavior: Serious attempt with actual or potential life-threatening injury Self-harm/self-injurious behaviors: Denies Access to means/weapons to harm self: Denies  Potential Risk to Others  Homicidal Ideations: Denies Current Plan: Denies  Symptoms  Sleep pattern changed: Yes When did the Sleep Pattern Change?: ongoing issues Sleep Hours per Night: 5-6 hrs a night Sleeping increased: No Sleeping decreased: Yes Sleep Decrease Details: frequent awakening;difficulty falling asleep Problems: Yes Other Sleep Problem Detail: nightmares Use sleep aid: Yes Type of Sleep Aid: Trazodone   Have you lost weight recently without trying?: No How much weight have you lost?: None Have you been eating poorly because of a decreased  appetite?: No Behaviors indicative of an eating disorder: No  Hopelessness/Helplessness: Yes Crying spells/mood swings: Yes Low energy/fatigue: Yes Concentration problems: No Psychomotor retardation/agitation: No Feelings of guilt/worthlessness: Yes Social withdrawal: No Recurrent thoughts of death: Yes Deterioration in Activities of Daily Living: No  Rapid pressured speech: No Increase in impulsivity: No Increase in energy: No Flight of ideas/loose association: No  Excessive worry: Yes Nervousness: Yes Irritability: Yes Shortness of breath: Yes Racing heart rate: Yes Sweaty/Chills/Hot flashes: Yes Nausea/Vomiting/Diarrhea: No Chest Pain: Yes     Psychosis  Coherency (the How): Coherent and organized Content (the What): Rational, organized, no evidence of psychosis Delusions: None Hallucinations: None  Treatments  Treatments?: Yes Treatment Provider/Location: per EMR pt had a previous online provider for therapy in May 2025 Treatment Type: Behavioral Health Treatment Date of Next Appt or Last Appt: last session May 2025 Additional Treatment?: Yes Treatment 2 Provider/Location: [facility] Treatment 2 Type: Behavioral Health;Inpatient Treatment 2 Date of Next Appt or Last Appt: Feb. 2025 Additional Treatment?: Yes Treatment 3 Provider/Location: [facility] 3 Type: Behavioral Health;Inpatient Treatment 3 Date of Next Appt or Last Appt: 2008 Additional Treatment?: Yes Treatment 4 Provider/Location: [facility] inpt x 2 8/26-02/14/24 and 5/23-28 [facility] Treatment 4 Type: Behavioral Health;Inpatient Additional Treatment?: Yes Treatment 5 Provider/Location: 08/31/23 [facility] 5 Type: Inpatient Did you follow up with your aftercare appointment?: Yes Did you take your medication as prescribed?: No Reason you did not take your Medication: pt reports that she has overdosed 3 times this year in an attempt to end her life  Substance Use  Substance  use in past 12  months?: Yes Drug Screen: Other (comment) (not complete at time of assessment) History of Substance Use/Abuse:: Yes Marijuana Use?: Patient Reports Marijuana -  Amount/Frequency: pt stated that she will smoke weed whenever she gets a chance Marijuana - Route of use: Smoking Marijuana -  Age of First Use: 18 yrs Marijuana -  Duration of Current Use: pt stated that she will smoke weed whenever she gets a chance, last time over a month ago Marijuana -  Last Used: over a month ago Marijuana -  Longest Period Not Used: not reported by pt Marijuana - How is it being acquired?: From a friend Cocaine/Crack Use?: No Opiates/Analgesics Use?: No Amphetamine Use?: No Sedative/Hypnotic Use?: No Club Drugs (Ecstasy) Use?: No Hallucinogen Use?: No Tobacco/Nicotine  Use?: Patient Reports Type of Tobacco/Nicotine : Cigarettes Not used any forms of cigarettes in the past 30 days: No Cigarette - Amount/Frequency: 1 pack per day Daily use of 5 or more cigarettes (more than 1/4 pack) in the past 30 days?: Yes Daily use of less than 5 cigarettes (less than 1/4 pack) in the past 30 days?: No Tobacco/Nicotine  -  Age of First Use: 17 yrs Tobacco/Nicotine  -  Duration of Current Use: 1 pack per day Tobacco/Nicotine  -  Last Used: prior to ED arrival Tobacco/Nicotine  -  Longest Period Not Used: not reported Other Drug Use?: No Withdrawal Potential: Denies Any Problems Related to Substance Use/Abuse: Denies Any Other non-substance addictive behaviors:: Pt denied.  Opiod Use Disorder         Clinical Opiate Withdrawal Scale Heart Rate: 79 Heart Rate Source: Monitor Patient Position: Lying     Alcohol  Use  Alcohol  abuse in past 12 months?: Yes History of Alcohol  Use/Abuse:: Yes Alcohol  Use?: Patient Reports Alcohol  Amount/Frequency: pt reports she has been drinking daily for years and will have b/w 1-15 drinks Alcohol  -  Age of First Use: 17 yrs Alcohol  -  Duration of Current Use: pt reports she has  been drinking daily for years and will have b/w 1-15 drinks Alcohol  -  Last Used: BAL was 172 when pt arrived in ED Alcohol  -  Longest Period Not Used: 18 months per EMR Alcohol  Withdrawal Potential: Sleep Problems;Headache;Shakes/Tremors;Depression Problems Related to Alcohol  Use/Abuse: Family/Friends concerned;Current cravings;Preoccupation with use;Prior Detox;Blackout (memory loss) Detox (How many): not reported by pt #1. How often do you have a drink containing alcohol ?: 4 or more times/week #2. How many drinks containing alcohol  do you have on a typical day when you are drinking?: 10 or more #3  How often do you have six or more drinks on one occasion?: Daily or almost daily #4 How often during the last year have you found that you were not able to stop drinking once you had started?: Daily or almost daily #5 How often during the last year have you failed to do what was normally expected from you because of drinking?: Weekly #6  How often during the last year have you needed a first drink in the morning to get yourself going after a heavy drinking session?: Daily or almost daily #7 How often during the last year have you had a feeling of guilt or remorse after drinking?: Never #8  How often during the last year have you been unable to remember what happened the night before because you had been drinking?: Never #9  Have you or someone else been injured as a result of your drinking?: Yes, during the last year #10  Has a relative, friend, doctor  or health worker been concerned about your drinking or suggested you cut down?: Yes, during the last year AUDIT tool score: 31 Dimension 1: Intoxication withdrawal and addiction medicines (associated risks and needs): High Dimension 2: Biomedical conditions and complications (not related to withdrawal; physical health or pregnancy related concerns): High Dimension 3:  Psychiatric and cognitve history (active psy symptoms, persistent disability,  cognitive functioning): High Dimension 4: Substance use-related risks: (likelihood of engaging in risky substance use or SUD related behaviors): High Dimension 5:  Recovery environment interactions (ability to function effectively, safety, and support in current environment): High Dimension 6:  Barriers to care:: Emotional State;Lack of Insight Patient preferences of care:: Residential Any mandates to increase motivation enhancement:: Social Services Appropriate Level of Care Patient is appropriate for the following level of care (choose the most acute problem area that applies):: Level 3.1: Clinically Managed Low-Intensity Residential Services       Functioning  Dressing: Needs assistance Bathing: Needs assistance Toileting: Needs assistance Feeding: Needs assistance Hearing - Right Ear: Functional Hearing - Left Ear: Functional Vision - Right Eye: Glasses - to see close up Vision - Left  Eye: Glasses - to see close up Walks in Home: Needs assistance Patient Fall Risk Level: High Date of last yearly physical:: May 2025 per EMR Possible barriers to participate in Treatment/Programming?: No Patient admitted from: Patient's Home-Caregiver Support Able to return to Current Living Arrangements?: Yes Support System:: pt reports no one Are there children in the home under the age of 40?: No What is your job situation right now?: Disabled Problems at work?: No Financial concerns: Food (per triage notes) Healthy coping skills: Other: (none) Recreational/Leisure activities: growing plants Religious/Spiritual orientation: none Cultural Preferences: none  Strengths/Limitations  Strength 1: has access to tx Strength 2: called for help Strength 3: cooperative with assesssment Limitation 1: alcohol  abuse Limitation 2: 3 attempts to end life this year Limitation 3: financial stressors  BH History  History of Abuse?: No Trauma: denies any Neglect: denies any Exploitation: denies  any Bereavement: both parents deceased 51 6 months apart  Legal Issues  Legal: No Engineer, drilling?: No  Child/Adolescent Assessment  Child / Adolescent?: No  Collateral Contacts  Collateral Contact Info: Pt Willing: Unable to Reach Collateral Contact Left Message asking for Call Back to:: daughter Alan Relationship to Patient: Daughter  Mental Status  General Appearance: older than stated age Motor Activity: normal Speech: normal Exhibited Behavior: suicidal Affect Range /Display: flat Mood Range /Display: Flat Affect/Mood Display: Congruent Mood: depressed;helpless Thought Process: wdl Thought Content: WDL Insight: limited Orientation To:: Person (Yes);Place (Yes);Situation (Yes);Date (Yes)      Electronically signed: Lamar FORBES Hint, [facility] 03/28/2024 / 10:53 PM

## 2024-03-28 NOTE — ED Notes (Signed)
 Charge made aware of pt reports Si without plan; awaiting bed placement

## 2024-03-28 NOTE — ED Notes (Signed)
 Patient assisted to the restroom with SA, urine specimen to be collected.

## 2024-03-28 NOTE — ED Notes (Signed)
 Patient in restroom, starts yelling at [facility] and staff, patient states she is upset that she has been moved to this room, and demanding something to help with sleep. Provider at patient door way, hears same. Orders placed for medications.

## 2024-03-28 NOTE — ED Notes (Signed)
 Patient completed 100% of 2 large cups of water  and 100% of 1 cup of ginger ale.

## 2024-03-28 NOTE — ED Notes (Signed)
 Patient cleared to move to the POD per provider.

## 2024-03-28 NOTE — ED Provider Notes (Signed)
 ------------------------------------------------------------------------------- Attestation signed by Glendia CHRISTELLA Jude, MD at 03/29/24 0009 I have reviewed and agree with the APP's findings and plan for this patient. Glendia CHRISTELLA Jude, MD -------------------------------------------------------------------------------   [facility]  ED Provider Note  Karen Casey 68 y.o. female DOB: 1955-10-25 MRN: [id number] History   Chief Complaint  Patient presents with  . Suicidal    Pt reports I wish I was dead. Pt reports ETOH 3 buds ice and 1/2 Mikes Pt reports I have to beg for food because we don't have food. Pt denies plan   Patient with a history of depression, suicidal ideation, suicide attempt in the past here for suicidal ideations.  She notes that she is intoxicated because she is grieving the loss of her husband 2 years ago.  She admits to suicidal ideations but says she would not do it because she does not have the pills to kill herself with currently.  Admits that she is tried by using her trazodone  in the past.  States that she has been trying to quit drinking alcohol  but she got hungry thus she started drinking.       Past Medical History:  Diagnosis Date  . A-fib (*)   . Anxiety   . COPD (chronic obstructive pulmonary disease) (*)   . Depression   . GERD (gastroesophageal reflux disease)   . Hypertension   . Nonpsychotic mental disorder   . Suicidal thoughts     Past Surgical History:  Procedure Laterality Date  . Appendectomy      Social History   Substance and Sexual Activity  Alcohol  Use Yes  . Alcohol /week: 10.0 - 18.0 standard drinks of alcohol   . Types: 10 - 18 Standard drinks or equivalent per week   Tobacco Use History[1] E-Cigarettes  . Vaping Use Never User   . Start Date    . Cartridges/Day    . Quit Date     Social History   Substance and Sexual Activity  Drug Use Not Currently  . Types: Marijuana,  Benzodiazepines         Allergies[2]  Home Medications   ACETAMINOPHEN  (TYLENOL ) 500 MG TABLET    Take two tablets (1,000 mg dose) by mouth 3 (three) times a day.   ALBUTEROL  SULFATE HFA (PROVENTIL ,VENTOLIN ,PROAIR ) 108 (90 BASE) MCG/ACT INHALER    Inhale two puffs into the lungs every 6 (six) hours as needed for Wheezing or Shortness of Breath for up to 30 days.   BUSPIRONE  (BUSPAR ) 15 MG TABLET    Take one tablet (15 mg dose) by mouth 3 (three) times a day.   CLONAZEPAM  (KLONOPIN ) 0.25 MG PARTIAL TABLET    Take one tablet (0.25 mg dose) by mouth 2 (two) times daily. Max Daily Amount: 0.5 mg   DULOXETINE  HCL (CYMBALTA ) 30 MG CAPSULE    Take one capsule (30 mg dose) by mouth daily.   ERGOCALCIFEROL  (VITAMIN D2) 50,000 UNITS CAPS CAPSULE    Take one capsule (50,000 Units dose) by mouth once a week at 0900 for 9 doses.   FOLIC ACID  1 MG TABLET    Take one tablet (1 mg dose) by mouth daily for 30 days.   GABAPENTIN  (NEURONTIN ) 400 MG CAPSULE    Take two capsules (800 mg dose) by mouth 3 (three) times a day.   HYDROXYZINE  HCL (ATARAX ) 25 MG TABLET    Take one tablet (25 mg dose) by mouth daily as needed for Anxiety.   NICOTINE  (HABITROL ,NICODERM CQ ) 21 MG/24 HOURS  Place one patch onto the skin daily.   OXYBUTYNIN  (DITROPAN -XL) 5 MG 24 HR TABLET    Take one tablet (5 mg dose) by mouth at bedtime for 30 days.   PANTOPRAZOLE  SODIUM (PROTONIX ) 40 MG TABLET    Take one tablet (40 mg dose) by mouth daily for 30 days.   THERA (THEREMS,ONE-DAILY) TABS    Take one tablet by mouth daily for 30 days.   THIAMINE  MONONITRATE 100 MG TABS    Take one tablet (100 mg dose) by mouth daily for 30 days.   TOPIRAMATE (TOPAMAX) 50 MG TABLET    Take one tablet (50 mg dose) by mouth 2 (two) times daily.   TRAZODONE  (DESYREL ) 50 MG TABLET    Take three tablets (150 mg dose) by mouth at bedtime as needed for Sleep (for insomnia).   TRELEGY ELLIPTA  100-62.5-25 MCG/ACT AEPB INHALER    Inhale one puff into the lungs daily  for 30 days.   VITAMIN B-12 1000 MCG TABLET    Take one tablet (1,000 mcg dose) by mouth daily for 30 days.    Primary Survey  Primary Survey  Review of Systems   Review of Systems  Constitutional:  Negative for chills and fever.  Respiratory:  Negative for shortness of breath.   Cardiovascular:  Negative for chest pain.  Gastrointestinal:  Negative for abdominal pain.  Neurological:  Negative for weakness and numbness.  Psychiatric/Behavioral:  Positive for suicidal ideas. Negative for confusion.     Physical Exam   ED Triage Vitals [03/28/24 2001]  BP 124/75  Heart Rate 86  Resp 13  SpO2 97 %  Temp 97.7 F (36.5 C)    Physical Exam  Nursing note and vitals reviewed. Constitutional: She appears well-developed and well-nourished.  HENT:  Head: Normocephalic and atraumatic.  Eyes: EOM are intact.  Neck: Normal range of motion.  Cardiovascular: Normal rate and regular rhythm.  Pulmonary/Chest: Respiratory effort normal and breath sounds normal.  Musculoskeletal:     Cervical back: Normal range of motion.   Neurological: She is alert and oriented to person, place, and time.  Skin: Skin is warm. Skin is dry.  Psychiatric: She has a normal mood and affect. Her behavior is normal.     ED Course   Lab results:   CBC AND DIFFERENTIAL - Abnormal      Result Value   WBC 6.1     RBC 3.90 (*)    HGB 10.7 (*)    HCT 34.3     MCV 87.9     MCH 27.4     MCHC 31.2 (*)    Plt Ct 267     RDW SD 52.6 (*)    MPV 9.5     NRBC% 0.0     Absolute NRBC Count 0.00     NEUTROPHIL % 58.3     LYMPHOCYTE % 30.6     MONOCYTE % 8.3     Eosinophil % 1.8     BASOPHIL % 0.5     IG% 0.5     ABSOLUTE NEUTROPHIL COUNT 3.58     ABSOLUTE LYMPHOCYTE COUNT 1.88     Absolute Monocyte Count 0.51     Absolute Eosinophil Count 0.11     Absolute Basophil Count 0.03     Absolute Immature Granulocyte Count 0.03    COMPREHENSIVE METABOLIC PANEL - Abnormal   Na 132 (*)    Potassium 3.2 (*)     Cl 96 (*)    CO2  19 (*)    AGAP 17 (*)    Glucose 88     BUN 9     Creatinine 0.64     Ca 8.7     ALK PHOS 98     T Bili <0.2     Total Protein 6.9     Alb 3.5 (*)    GLOBULIN 3.4     ALBUMIN/GLOBULIN RATIO 1.0 (*)    BUN/CREAT RATIO 14.1     ALT 7     AST 13     eGFR 98     Comment: Normal GFR (glomerular filtration rate) > 60 mL/min/1.73 meters squared, < 60 may include impaired kidney function. Calculation based on the Chronic Kidney Disease Epidemiology Collaboration (CK-EPI)equation refit without adjustment for race.  SALICYLATE LEVEL - Abnormal   Salicylate <3.0 (*)   ACETAMINOPHEN  LEVEL - Abnormal   Acetaminophen  6.5 (*)   ETHANOL - Normal   Ethanol 172     Comment: Blood Alcohol  Level is for Medical Purposes Only.  URINE DRUGS OF ABUSE SCRN  URINE CUP   Narrative:    The following orders were created for panel order Urine Cup Urine, Clean Catch. Procedure                               Abnormality         Status                    ---------                               -----------         ------                    Urine Rle[[id number]]                                                                  Please view results for these tests on the individual orders.  LIGHT BLUE TOP  GOLD SST  URINE CUP    Imaging: No data to display   ECG: ECG Results   None                                                                        Pre-Sedation Procedures    Medical Decision Making Patient with a history of suicide attempt by medication here intoxicating spouse saying suicidal ideations although denying the current means to actually kill herself.  Review of chart does indicate she was hospitalized for suicide attempt fairly recently.  I am concerned especially given her intoxicated state I will recommend IVC.  Mild hyponatremia and hypokalemia supplemented.  She does appear appropriate for [facility]  The patient is  currently medically stable, and appropriate for behavioral health evaluation. 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 Patient  would benefit from psychiatric evaluation to lend expertise and help determine the best disposition and treatment plan.  To ensure ongoing care as patient awaits BH evaluation, the following measures have been taken: -All acute medical issues identified on ED evaluation have been addressed  -Behavioral Health ED holding orders have been placed (including CIWA/COWS orders as needed) -Home Medications will be addressed through medication reconciliation process -Dietary orders, including blood sugar control, have been placed.   Amount and/or Complexity of Data Reviewed Labs: ordered. ECG/medicine tests: ordered.  Risk OTC drugs. Prescription drug management. Decision regarding hospitalization.          Provider Communication  New Prescriptions   No medications on file    Modified Medications   No medications on file    Discontinued Medications   No medications on file    Clinical Impression Final diagnoses:  Suicidal ideation  Alcoholic intoxication without complication  Hyponatremia  Hypokalemia    ED Disposition     ED Disposition  Behavioral Health    Condition  --   Comment  --                   Electronically signed by:       [1] Social History Tobacco Use  Smoking Status Every Day  . Types: Cigarettes  Smokeless Tobacco Never  [2] Allergies Allergen Reactions  . Capsaicin -Menthol Other and Rash    Burning and peeling on area applied to  . Diclofenac  Sodium Other and Rash    GEL AND CREAM-burning and peeling at the sight of application.  Burning and peeling at the sight of application.   Alyce JONETTA Mater, PA-C 03/28/24 2106

## 2024-03-29 DIAGNOSIS — F1090 Alcohol use, unspecified, uncomplicated: Secondary | ICD-10-CM | POA: Diagnosis not present

## 2024-03-29 DIAGNOSIS — F332 Major depressive disorder, recurrent severe without psychotic features: Secondary | ICD-10-CM | POA: Diagnosis not present

## 2024-03-29 DIAGNOSIS — F411 Generalized anxiety disorder: Secondary | ICD-10-CM | POA: Diagnosis not present

## 2024-03-29 NOTE — Group Note (Signed)
  Group Therapy Note   Group Date/Time: 03/29/2024 1345 - 1445 Group Topic:  BH Rec Therapy  Group Department: [facility]  Group Facilitator: Harlene LOISE Baller, LRT CTRS  Group Subtopic : leisure skills/awareness, art, music, relaxation  Patient Problem or Treatment Goal Addressed: Ineffective Coping and Altered Thought Processes  Teaching Methods/Interventions: Encouraged, Reinforced, Explained, and Group Discussion  Audio Visual Played (if applicable):   Written Material Given (if applicable):   Behavior: Calm  Affect/Mood: Flat/Blunted  Thoughts: Clear  Readiness to Learn/Patient Participation Level: Active  Attention Span: Alert  Barriers to Learning: Acuity of Illness  Patient/Family Response: Verbalizes Understanding of Information  Care Plan Updated?  No  Electronically signed: Harlene LOISE Baller, LRT CTRS 03/29/2024 / 5:02 PM

## 2024-03-29 NOTE — Progress Notes (Signed)
 03/29/24 1710  Therapeutic Recreation  Source of Information Patient;Chart  Reason for Hospitalization per the Patient. Same stuff. I didn't want to come back.  Patient's ability to answer questions Independently  Patient admitted from Patient's Home-Caregiver Support  Transportation Dependent on Others  Current City/County Living In [place], [place]  Work/School Disabled  Highest Level Education High School Graduate  Physical  Is the patient able to complete ADL's and household tasks independently? Yes  Vision - Right Eye Glasses - to see close up  Vision - Left  Eye Glasses - to see close up  Hearing - Right Ear Functional  Hearing - Left Ear Functional  Additional Equipment/Devices used at home [facility]  Social Leisure  Does the patient have a positive support system? Yes  Who is the Support? Daughter; Sister  How many times does the patient get out of the house a week? Not enough.  Does the patient have any knowledge of community resources? No  The Patient is Comfortable in: Large Groups;Small Groups;Alone  Has the patient's daily routine changed since not feeling well? Yes  How has the routine changed? Pt identified her SIL was just released from jail and has came back to the home.  Does the patient have any cultural beliefs/activites that would impact care? No  How does the patient spend Cognitive leisure / free time? Reading;Puzzles (Word Search puzzles)  How does the patient spend Passive leisure / free time? TV;Computer/internet;Music  How does the patient spend Active leisure / free time? Play with kids  How does the patient spend Creative leisure / free time? Music  How does the patient spend Social leisure / free time? Friend/family gatherings  Does the patient prefer to spend leisure/free time Both  Has the patient been active in leisure activities No  If no, how long has it been? Its been a while.  Barriers to participating in leisure activities? Motivation;Physical  limitations;Pain;Companionship  Describe Barriers: I don't think I will get over my husband dying.  Does the patient have any coping skills? Yes  Healthy coping skills Other: (Sit outside)  Unhealthy coping skills Smoking  Cognitive  Does the patient have issues  or difficulty with memory? No  Orientation To: Person (Yes);Place (Yes);Situation (Yes);Date (Yes)  Hallucinations? No  Delusions? No  Paranoia Evidenced by: pt denies  Does the patient have difficulty with time management skills? Needs Assistance  Does the patient have difficulty with their ability to concentrate or focus? Needs Assistance  Does the patient have difficulty with problem solving skills? Needs Assistance  Emotional  Is there concern about patient's self esteem? Yes  Describe Self Esteem Concern: Pt identifies hopeless and helpless thinking.  Is there concern about patient's motivation/energy level? Yes  Describe Motivation/Energy Level  Concern: Pt identifies low energy levels.  Does the patient have life stresses? Yes  Life Stresses Identified Medical Issues;Other: (Living situation.)  Is there concern about patient's anger management? Yes  Describe Concern about Anger Management Pt identifies she will be easily angered especially by her SIL.  Is the patient comfortable talking about his/her feelings and concerns? Yes  History of Abuse? No  Substance Abuse Questions  Does the patient abuse Drugs and Alcohol ? No  Patient Perspective  What else should we know about you in order to help you best while you are here? I haven't felt like myself.  How do you feel staff can help you most? Help me contorl my anger.  What would you like to change about  your daily routine? I'm getting a private room in [place] and I'm going to take my cat.  Therapist Summary  Patient's behaviors impacting Therapy: Withdrawn  Patient's Strengths: Physical abilities;Cognitive abilities  Patient's Weaknesses: Support  system;Self esteem/image;Motivation;Coping skills;Poor judgement  Suggested Interventions: Exercise;Relaxation techniques;Social skills development;Leisure skill development;Sensory stimulation;Leisure education;Community awareness;Reminiscing;Cognitive stimulation;Music;Self awareness  Patient Education Stress management;Self esteem;Coping skills;Wellness education;Goal setting  Goals for RT treatment during this hospitalization: Pt will attend/participate in 45 mins of RT led grp per day for 3 days per wk with improved mood and pt will identify 2 positive coping skills.  Rec Therapist Comments: Pt was adm for increased depression and SI. Pt was cooperative with interview. Depressed affect during interview. She ambs with a walker. Pt identified tv, music, playing games on the phone, word search puzzles, spending time with family, and reading as leisure interests. A/O x 4. Staff will continue to encourage pt to attend future grp sessions as part of her tx.  Bari GORMAN Clause, LRT CTRS 03/29/2024 / 5:23 PM

## 2024-03-29 NOTE — Care Plan (Signed)
  Problem: Discharge Planning Goal: Knowledge of medical problems (What is my main problem?) Outcome: Progressing Goal: Knowledge of self care (What do I need to do when I go home?) Outcome: Progressing Goal: Knowledge of treatment plan (Why is it important for me to do this?) Outcome: Progressing Goal: Knowledge of medication management Outcome: Progressing

## 2024-03-29 NOTE — Care Plan (Signed)
  Problem: Discharge Planning Goal: Knowledge of medical problems (What is my main problem?) Outcome: Progressing Goal: Knowledge of self care (What do I need to do when I go home?) Outcome: Progressing Goal: Knowledge of treatment plan (Why is it important for me to do this?) Outcome: Progressing Goal: Knowledge of medication management Outcome: Progressing   Problem: Discharge Planning, Adult/Pediatric Goal: Knowledge of medical problems (What is my main problem?) Outcome: Progressing Goal: Knowledge of self care (What do I need to do when I go home?) Outcome: Progressing Goal: Knowledge of treatment plan (Why is it important for me to do this?) Outcome: Progressing

## 2024-03-29 NOTE — Nursing Note (Addendum)
   03/29/24 1000  Purposeful Rounding  Purposeful Rounding Yes  Arm Bands On Fall (ID)  Affect/Mood  Affect Range /Display flat  Mood Range /Display Flat  Affect/Mood Display Congruent  Mood depressed;helpless  Thought Process/Psychosis  Coherency (the How) Coherent and organized  Content (the What) Rational, organized, no evidence of psychosis  Delusions None  Hallucinations None  MOAS+  Verbal Aggression 0  Aggression against Property 0  Aggression against Self 0  Sexual Aggression 0  Aggression against Others 0  MOAS+ Score 0  Behavior  Eye Contact Good  Exhibited Behavior guarded  Safety Attendant  Safety Attendant Continued  Alternatives to Safety Attendant Reorientation to surroundings  Indications for Sitter Suicidal risk  Suicide Shift Reassessment  1. Have you actually had thoughts about killing yourself? Yes  2. Have you been thinking about how you might do this? No  3. Have you had these thoughts and had some intention of acting on them? No  4. Have you started to work out or worked out the details of how to kill yourself? Do you intend to carry out this plan? No  5. Have you done anything, started to do anything, or prepared to do anything to end your life? No    A/O x 4. Up ad lib with a walker and continues to wear nonskid footwear for fall prevention. Medication compliant. PO intake adequate. LBM 10/16.   PRN Tylenol  PO @ 0836 per request. Medication partially effective.  Atarax  PO @ 0836 per request- medication partially effective,   Shelba CHRISTELLA Dolin, LPN 89/83/7974 / 5:35 PM

## 2024-03-29 NOTE — Progress Notes (Signed)
 BHCS met with patient briefly. She was in bed and stating that she was still very tired and did not feel up to talking. I told her I would let her rest and come back to see her tomorrow.

## 2024-03-29 NOTE — ED Notes (Signed)
 Patient soiled with urine covering bedding and herself. Patient cleaned up, bedding changed, patient placed in brief due to same. Patient is now seated in recliner in patient room. BH cart at bedside for eval.

## 2024-03-29 NOTE — H&P (Addendum)
 [facility] - H&P    Date of Admission: 03/29/2024 Attending Provider:  Danella Raejean Games, MD Code Status:  Full Code History Source:  patient and past medical records Record Review: moderate Date of birth: 08/17/1955 Assessment   Hospital Diagnoses: Principal Problem:   Major depressive disorder, recurrent, severe without psychotic features (*)   Karen Casey is a 68 y.o. female that has a previous history of depression, anxiety, alcohol  use, AAF, COPD, GERD, and HTN, who presented to the ED for SI.She  was brought to the ED by EMS after pt called for help stating that she was having SI and wishes I was dead. Patient had three past attempts to end her life in the last year and she stated that she does not feel safe to dc home .   Reason(s) for Admission: current assaultive threats or behavior, resulting from a psychiatric disorder, with a clear risk of escalation or future repetition, recent history prior to admission, prompting evaluation of significant risk-taking that results in danger to self or others or violence from a psychiatric disorder, patient manifests major disability in social, interpersonal, occupational &/or educational functioning which is leading to life-threatening functioning and can only be addressed in a hospital setting, and inability to maintain adequate nutrition or self-care due to a psychiatric disorder and family/community support cannot be relied upon to provide essential care.  Based on risk assessment and clinical exam, patient is at risk for: acute suicide or self-harm risk (Medium), substance abuse risk; (Medium), and meets criteria for voluntary hospitalization.  Individualized risk factors include: previous suicide attempt(s), has severe anxiety, has severe insomnia, hopelessness, substance abuse, and recent loss.  Individualized protective factors include: patient has treatable psychiatric  disorders and symptoms, moving patient to higher level of care, no access to firearms, optimism that change can occur, participating in treatment plan, and awareness of substance abuse issues.  Patient's strengths include: healthcare insurance, access to reliable transportation, high school/GED graduate, intact reality testing, involved family members, previous compliance with treatment, has hope for a better future, and believes they can get help.  Treatment Plan   - Disposition:  - voluntary psychiatric hospitalization recommended for risk of self injury and substance dependent, requiring level 4 inpatient detoxification and continued psychiatric hospitalization justified due to continued danger to self and high probability of danger if discharged with imminent rehospitalization likely.  - Precautions:  - suicide, fall, and DT/ETOH - Goals and Interventions:  - Group and milieu therapy with family/outpatient support meeting as tolerated. Treatment plan focuses on modifiable risk factors and includes: level of care coordination and recommendations, risks/benefits/alternatives to treatment including common side effects and black box warnings on medication, risk factor reduction, safety goals/plan, and importance of compliance with chosen treatment. - Medications:           -Continue CIWA protocol for alcohol  withdrawal             -Nicotine  Patch 21mg    - Klonopin  0.25 mg po bid ( kept to manage anxiety and alcohol  w/d Sx )./ Cymbalta  increased to 60 mg po am / Topamax 50 mg po bid to manage headache  / Gabapentin  800 mg po tid ( manage pain ).  - Pertinent Labs:  - CMP: Na 132, K 3.2, Cl 96 , AGAP 17 , UDS : neg, Lipid panel Normal., Ethanol level 172, CBC HGB 10.7, RBC 3.90, RDW 52.6,  [id number]-455   - Consults:  - NICS - Estimated date  of discharge:  -  TBD  -  I certify that the patient does need, on a daily basis, active treatment furnished directly by or requiring the supervision of  inpatient psychiatric facility personnel.  For inpatient care, physician will direct treatment team plan within three days of admission and at least weekly thereafter.   CC & HPI   CC: I'm in grief .  Karen Casey is a 68 y.o. female that has a previous history of depression, anxiety, alcohol  use, she  was brought to the ED after pt called for help stating that she was having SI and wishes I was dead. Patient reports three past attempts to end her life  by overdosing , in the last year and she stated that she does not feel safe to dc home.  Pt admits to daily alcohol  use drink 1-15 beers per day. Pt reports ongoing hopelessness and stated I am very depressed. Pt stated that she has had poor sleep, poor appetite, she is drinking daily and having nightmares.  Pt stated her husband died in 06/13/2022 and she has nothing to live for now.  Pt stated that she has been living with her daughter around a year , and she could not care less about living. Patient endorses that she has a really bad communication with her son in law , and the family composition is complex , her daughter has 2 autistic children and other 2 adopted children . Patient is thinking in rent a space in a house , to have an independent dwelling .  Patient presents with depression symptoms of: hopelessness & helplessness, mood swings, concentration problems, psychomotor retardation, recurrent thoughts of death, and deterioration in activities of daily living. Her abilities and her skills to manage household chores have became poor. According her Labs results and  weight loss ,  she looks have an undernutrition status   Patient does not present  mania symptoms such as increase in impulsivity, increase in energy, and flight of ideas/loose association. Patient presents with anxiety symptoms . Besides this patient reports changes to sleep pattern, which include decreased sleeping and difficulty falling asleep.  Current suicidal/homicidal  ideations: Endorses passive death wishes . Current auditory/visual hallucinations: Denies  Past Psychiatric History   Previous diagnoses: MDD / Alcohol  Use Disorder  / GAD Previous psychiatric medication trials: Cymbalta , Topamax, Klonopin ., Trazodone  . Past suicidal/homicidal ideation/attempt: Yes, Hx of intentional OD x 3 . Current/Past psychiatric provider: in past Dr Erich. Previous psychiatric hospitalizations/Rehab: yes. [facility]- 01/2024, May 2025 OD.    Past Medical History   Past Medical History:  Diagnosis Date  . A-fib (*)   . Anxiety   . COPD (chronic obstructive pulmonary disease) (*)   . Depression   . GERD (gastroesophageal reflux disease)   . Hypertension   . Nonpsychotic mental disorder   . Suicidal thoughts     Substance Use History (Over the past 12 months)   Marijuana: Denies Cocaine: Denies Opiates: Denies Stimulants: Denies Benzodiazepine: prescribed Klonopin . Tobacco:  Denies Alcohol : daily Other illicit drug usage: Denies  Patient denies all other substance use except for what is listed above.  History of substance/alcohol  abuse treatment: Yes  The patient was counseled on the dangers of alcohol /substance use and practical counseling included: Concern for unhealthy drinking, alcohol  linked to health risk such as HTN, DM, hepatic failure, and/or stroke, and advised to abstain  See [facility] staff evaluations & assessments for further details.  Findings to be discussed by team and integrated into treatment  plan as indicated.  Tobacco Use Screening and Recommendation   Current tobacco use? Yes: Type of tobacco used -: Cigarettes  The patient was counseled on the dangers of tobacco use and practical counseling included: recognizing dangerous situations, development of coping skills, and basic info about quitting and link to health risks such as HTN, COPD, Cancer, heart attack, and/or stroke, and advise to abstain.  Reviewed strategies to maximize success,  including pharmacotherapy (patch).  FDA-approved cessation medication offered/received: Nicotine  replacement therapy    Social and Family History   Trauma Hx: No issues of physical, emotional, or sexual abuse are reported.  Military Hx: no Legal history: Denies any history of legal issues Developmental: none  Family Psych Hx: father was a worry person and uncle mother side was alcoholic.  Employment Hx: disabled Access to firearms: no  Social History[1] No family history on file.  Evaluation   Medications: . budesonide -formoterol   2 puff Inhalation Q12H RSP   And  . umeclidinium bromide   1 puff Inhalation Daily RSP  . busPIRone   15 mg Oral TID  . clonazePAM   0.25 mg Oral BID  . DULoxetine  HCl  30 mg Oral Daily  . ergocalciferol   50,000 Units Oral Weekly at 0900  . folic acid   1 mg Oral Daily  . [START ON 03/30/2024] folic acid   1 mg Oral Daily  . gabapentin   800 mg Oral TID  . gabapentin   800 mg Oral TID  . nicotine   1 patch Transdermal Daily  . [START ON 03/30/2024] nicotine   1 patch Transdermal Daily  . oxybutynin   5 mg Oral HS  . pantoprazole  sodium  40 mg Oral Daily  . [START ON 03/30/2024] pantoprazole  sodium  40 mg Oral Daily  . potassium chloride   20 mEq Oral Daily  . sodium chloride   1 g Oral MEALS  . THERA  1 tablet Oral Daily  . [START ON 03/30/2024] THERA  1 tablet Oral Daily  . thiamine   100 mg Oral Daily  . [START ON 03/30/2024] Thiamine  Mononitrate  100 mg Oral Daily  . topiramate  50 mg Oral BID   acetaminophen , albuterol  sulfate HFA, hydrOXYzine  HCl, hydrOXYzine  HCl, LORazepam , LORazepam , ondansetron , traZODone  Allergies: Allergies[2] Vitals:  Vitals:   03/29/24 0801  BP:   Pulse:   Resp: 18  Temp: 96.8 F (36 C)  SpO2: 100%     All labs obtained and reviewed from the current EHR through [facility] laboratories, unless otherwise stated: Lab Results  Component Value Date   WBC 6.1 03/28/2024   HGB 10.7 (L) 03/28/2024   HCT 34.3  03/28/2024   Plt Ct 267 03/28/2024   CHOLESTEROL TOTAL 110 03/29/2024   Trig 99 03/29/2024   HDL 46 03/29/2024   LDL 44 03/29/2024   ALT 7 89/84/7974   AST 13 03/28/2024   Na 132 (L) 03/28/2024   Potassium 3.2 (L) 03/28/2024   Cl 96 (L) 03/28/2024   Creatinine 0.64 03/28/2024   BUN 9 03/28/2024   CO2 19 (L) 03/28/2024   Glucose 88 03/28/2024   Hemoglobin A1c 5.5 02/08/2024   No results found for: PHENYTOIN, PHENOBARB, VALPROATE, CBMZ, LITHIUM Lab Results  Component Value Date   Vitamin B-12 303 02/08/2024   Recent Results (from the past week)  Urine Drug Screen   Collection Time: 03/28/24 10:29 PM  Result Value Ref Range   Ur PH DOA Scr 6.5 4.5 - 9.0   Amphet Scr Negative Negative   Barb Scr Negative Negative   Benzo Scr  Negative Negative   Cannab Scr Negative Negative   Cocaine Scr Negative Negative   Opiates Scr Negative Negative   Meth Scr Negative Negative   Oxyco Scr Negative Negative   Fentanyl  Scr Negative Negative   Buprenorphine Screen Negative Negative    Reviewed Last EKG on File ECG 12 lead Result Date: 02/06/2024 Diagnosis Class Normal Acquisition Device D3K Systolic BP 122 Diastolic BP 69 Ventricular Rate 72 Atrial Rate 72 P-R Interval 144 QRS Duration 72 Q-T Interval 424 QTC Calculation(Bazett) 464 Calculated P Axis 17 Calculated R Axis 37 Calculated T Axis 61 Diagnosis Normal sinus rhythm Normal ECG When compared with ECG of 05-Nov-2023 16:01, No significant change was found Wahid, Asif ([id number]) on 02/06/2024 7:29:37 AM certifies that he/she has reviewed the ECG tracing and confirms the independent  interpretation is correct.  ECG 12-Lead Result Date: 11/06/2023 Diagnosis Class Normal Acquisition Device MV360 Ventricular Rate 66 Atrial Rate 66 P-R Interval 164 QRS Duration 72 Q-T Interval 464 QTC Calculation(Bazett) 486 Calculated P Axis 61 Calculated R Axis 16 Calculated T Axis 44 Diagnosis Normal sinus rhythm Normal ECG Khawaja, Usman ([id number]) on  11/06/2023 12:07:52 PM certifies that he/she has reviewed the ECG tracing and confirms the  independent interpretation is correct.  ECG 12 lead Result Date: 11/05/2023 Diagnosis Class Abnormal Acquisition Device D3K Systolic BP 78 Diastolic BP 42 Ventricular Rate 61 Atrial Rate 61 P-R Interval 172 QRS Duration 70 Q-T Interval 494 QTC Calculation(Bazett) 497 Calculated P Axis 68 Calculated R Axis 32 Calculated T Axis 71 Diagnosis Normal sinus rhythm Low voltage QRS Septal infarct (cited on or before 04-Nov-2023) Abnormal ECG When compared with ECG of 04-Nov-2023 01:21, No significant change was found Khawaja, Usman ([id number]) on 11/05/2023 10:32:29 AM certifies that he/she has reviewed the ECG tracing and confirms the  independent interpretation is correct.  ECG 12 lead Result Date: 11/04/2023 Diagnosis Class Normal Acquisition Device D3K Systolic BP 96 Diastolic BP 55 Ventricular Rate 64 Atrial Rate 64 P-R Interval 170 QRS Duration 74 Q-T Interval 488 QTC Calculation(Bazett) 503 Calculated P Axis 70 Calculated R Axis 31 Calculated T Axis 59 Diagnosis Normal sinus rhythm No acute injury pattern When compared with ECG of 03-Nov-2023 23:40, No significant change Bridgett Lenis ([id number]) on 11/04/2023 3:29:02 AM certifies that he/she has reviewed the ECG tracing and confirms the independent  interpretation is correct.  ECG 12 lead Result Date: 11/03/2023 Diagnosis Class Normal Acquisition Device D3K Systolic BP 126 Diastolic BP 70 Ventricular Rate 69 Atrial Rate 69 P-R Interval 164 QRS Duration 74 Q-T Interval 450 QTC Calculation(Bazett) 482 Calculated P Axis 63 Calculated R Axis 21 Calculated T Axis 70 Diagnosis Normal sinus rhythm Normal ECG No previous ECGs available Bridgett Lenis ([id number]) on 11/03/2023 10:21:09 PM certifies that he/she has reviewed the ECG tracing and confirms the independent  interpretation is correct.  Imaging on File (last 24 hours) No results found.  Metabolic Screening: Team to review results  with patient prior to discharge as applicable (e.g. if patient on antipsychotics at discharge) Metabolic screening labs have been completed within the past 12 months and results are below.  BMI: Estimated body mass index is 22.9 kg/m as calculated from the following:   Height as of this encounter: 5' 4 (1.626 m).   Weight as of this encounter: 133 lb 6.4 oz (60.5 kg). Hemoglobin A1c  Date Value Ref Range Status  02/08/2024 5.5 4.8 - 5.6 % Final   CHOLESTEROL TOTAL  Date Value Ref Range Status  03/29/2024 110 100 -  199 mg/dL Final   Trig  Date Value Ref Range Status  03/29/2024 99 0 - 149 mg/dL Final   HDL  Date Value Ref Range Status  03/29/2024 46 >=39 mg/dL Final   LDL  Date Value Ref Range Status  03/29/2024 44 0 - 99 mg/dL Final   VLDL  Date Value Ref Range Status  03/29/2024 20 5 - 40 mg/dl Final   CHOL/HDL  Date Value Ref Range Status  03/29/2024 2 0 - 5 Final     Review Of Systems: A complete review of systems of the following systems was conducted (Constitutional, Psychiatric, Neurological, Musculoskeletal, Eyes, Gastrointestinal, Cardiovascular, Respiratory, Skin, and Endocrine). All reviewed systems are negative except pertinent positives identified in the HPI.  Physical Exam:   General appearance - oriented to person, place, and time, dehydrated, anxious, chronically ill appearing, and underweight , looks older stated age. Skin - LESIONS NOTED: red, excoriated, dry on the legs  Mental Status Evaluation   Constitutional:   General Appearance Casually dressed , Unkempt, Disheveled, Ill appearing, Underweight, Appears older than stated age, and Wearing hospital scrubs and normal appearance   General Behavior Withdrawn, Good eye contact, and cooperative, tearful  Musculoskeletal:   Gait and Station No gait abnormalities   Strength and tone Normal  Psychiatric:   Psychomotor Activity No motor abnormalities  Speech  Soft, Slow, Paucity, and Monotone  Mood   Anxious, Depressed, and Concerned  Affect  Restricted, Depressed, and Mood congruent   Thought Process Linear, logical, and goal directed  Associations Intact association  Thought Content/Perceptual Disturbances Patient denies suicidal/homicidal ideation; No evidence of auditory/visual hallucinations or delusions;  Cognition/Sensorium  AAOx4; Memory, attention, language, and fund of knowledge intact   Insight  Fair  Judgment Fair   Measurement Based Care Review   Independently review/perform testing/interpretation:  PHQ-9: 19  Current PHQ2(9)/CIWA/AUDIT/COWS       Measurement Based Care Review: PHQ9  More data may exist      03/28/2024 02/07/2024 02/05/2024 11/04/2023 11/03/2023  Depression Screen  Wish to be Dead: Yes Yes Yes Yes Yes  Suicidal Thoughts: Yes Yes Yes Yes Yes  Sucidal Thoughts with Method (without Specific or Intent to Act): No Yes Yes Yes Yes  Suicidal Intent (without Specific Plan): No Yes Yes Yes No  Suicide Intent with Specific Plan: No - Yes Yes Yes  Suicide Behavior Question: Yes Yes Yes Yes Yes  How long ago did you do any of these? Within the last 3 months Within the last 3 months Within the last 3 months - Within the last 3 months *  C-SSRS Screening Result - High Risk - Consider immediate referral/mental health evaluation High Risk - Consider immediate referral/mental health evaluation High Risk - Consider immediate referral/mental health evaluation High Risk - Consider immediate referral/mental health evaluation    Details      * Data saved with a previous flowsheet row definition        GAD7 Review       * No data to display            Electronically signed by: Danella Raejean Games, MD, MD 03/29/2024 11:13 AM         [1] Social History Socioeconomic History  . Marital status: Widowed  Tobacco Use  . Smoking status: Every Day    Types: Cigarettes  . Smokeless tobacco: Never  Vaping Use  . Vaping status: Never Used   Substance and Sexual Activity  . Alcohol  use: Yes  Alcohol /week: 10.0 - 18.0 standard drinks of alcohol     Types: 10 - 18 Standard drinks or equivalent per week  . Drug use: Not Currently    Types: Marijuana, Benzodiazepines  [2] Allergies Allergen Reactions  . Capsaicin -Menthol Other and Rash    Burning and peeling on area applied to  . Diclofenac  Sodium Other and Rash    GEL AND CREAM-burning and peeling at the sight of application.  Burning and peeling at the sight of application.  *Some images could not be shown.

## 2024-03-29 NOTE — Progress Notes (Addendum)
 [facility]       [facility] - Tele-Behavioral Placement and Disposition Note   Patient Name:  Karen Casey Date of Birth:  27-Nov-1955  Today's Date:  March 29, 2024 Disposition  - ED Contact Name: Damien Sprague, EMT and Asberry Gee, NP - ED Contact Date: March 29, 2024 - ED Contact Time: 0316 - Disposition Recommendation: Inpatient psych admission - Admission Type: Involuntary - Accepting Facility Name/Address: [facility] [id number] [place], [place]) - Accepting Facility Report phone number: [phone].  - Accepting Psychiatrist Name: Admitting Provider is Dr. Harlene Bohr.  Attending Provider will be Dr. Danella Games.  - Assigned Room Number: 402-1  - Guardian Contacted: N/A., pt's daughter, Bennetta Rudden, was called and left secure VM - Disposition Comments: Custody Order has been served.  Please scan all IVC paperwork into Media Manager and transport hard copies with patient.  Report can be called to [phone].  Bed is ready.      Electronically signed by: Lionel LITTIE Potters, LCSW 03/29/2024 3:16 AM  Damien    Electronically signed: Lionel LITTIE Potters, LCSW 03/29/2024 / 3:15 AM

## 2024-03-29 NOTE — ED Notes (Signed)
 Patient requesting to know when she is being moved to another room or moved to another facility. Patient is made aware that this is a process, a bed has been requested for her, however I cannot tell her if this will be at this facility or another facility and I am unable to give her a timeframe of same at this time.

## 2024-03-29 NOTE — ED Notes (Signed)
 Patient requesting Tylenol  for dental pain. PRN Tylenol  to be given for mild pain.

## 2024-03-29 NOTE — Nursing Note (Signed)
   03/29/24 2041  Purposeful Rounding  Purposeful Rounding Yes  Affect/Mood  Affect Range /Display flat  Mood Range /Display Flat  Affect/Mood Display Congruent  Mood depressed;helpless  Thought Process/Psychosis  Coherency (the How) Coherent and organized  Content (the What) Rational, organized, no evidence of psychosis  Delusions None  Hallucinations None  MOAS+  Verbal Aggression 0  Aggression against Property 0  Aggression against Self 0  Sexual Aggression 0  Aggression against Others 0  MOAS+ Score 0  Behavior  Eye Contact Good  Exhibited Behavior guarded  Safety Attendant  Alternatives to Safety Attendant Verbal redirection;Medication;Reorientation to surroundings  Suicide Shift Reassessment  1. Have you actually had thoughts about killing yourself? Yes  2. Have you been thinking about how you might do this? No  3. Have you had these thoughts and had some intention of acting on them? No  4. Have you started to work out or worked out the details of how to kill yourself? Do you intend to carry out this plan? No  5. Have you done anything, started to do anything, or prepared to do anything to end your life? No  Shift Reassessment Risk Score: Low Risk  Search (select all that apply) Room Search;Patient Search  Reason for Search Precautions  Search Outcome? Contraband Not Found  Additional Team Member(s) Present During Search cna-Sharon   Patient alert and oriented x4. Calm and cooperative while lying in bed. Denies pain at this time. Denies SI/HI and AVH. Endorses anxiety and depression. Adequate meal intake. Last bowel movement 03/29/2024. Up with assist to walker. Needs moderate assist with most ADL's. Slept 7.5 hours  Tylenol  (2332) toothache- Effective

## 2024-03-29 NOTE — Group Note (Signed)
  Group Therapy Note   Group Date/Time: 03/29/2024 1000 - 1100 Group Topic:  BH Rec Therapy  Group Department: [facility]  Group Facilitator: Bari GORMAN Clause, LRT CTRS  Group Subtopic :Principal Financial; DBT Skills; Economist; Distress Tolerance  Patient Problem or Treatment Goal Addressed: Ineffective Coping and Altered Thought Processes  Teaching Methods/Interventions: Encouraged, Explained, Demonstrated, and Group Discussion  Audio Visual Played (if applicable): N/A  Written Material Given (if applicable): N/A  Behavior: Withdrawn  Affect/Mood: Flat/Blunted  Thoughts: Clear  Readiness to Learn/Patient Participation Level: Active  Attention Span: Alert  Barriers to Learning: None=no barriers  Patient/Family Response: No Evidence of Learning  Care Plan Updated?  No    Pt min engaged in grp discussion and provided little input. W/d to self. Had her head down a lot of grp session. Unreceptive to encouragement to engage more.   Electronically signed: Bari GORMAN Clause, LRT CTRS 03/29/2024 / 3:31 PM

## 2024-03-30 NOTE — Progress Notes (Addendum)
 Food and Nutrition Assessment: Initial  Patient Name: Karen Casey Age: 68 y.o. Length of Stay: 2  Location: [facility] 402/402-01  Date of Birth: 29-Apr-1956  Sex: female MRN: [id number]  Admit Date/Time:  03/29/2024  3:52 AM   Assessment:  Evaluation per MD consult to initiate, manage, treat (all medical nutrition therapy modalities), write orders re: Abnormal Labs ,weight loss  Inpatient consult to Nutrition Services Consult performed by: Therisa Asal Consult ordered by: Danella Raejean Games, MD Reason for consult: Comments: Abnormal Labs , weight loss    Admitted with major depressive disorder, recurrent, severe without psychotic features  Principal Problem:   Major depressive disorder, recurrent, severe without psychotic features (*)  Past Medical History:  Diagnosis Date  . A-fib (*)   . Anxiety   . COPD (chronic obstructive pulmonary disease) (*)   . Depression   . GERD (gastroesophageal reflux disease)   . Hypertension   . Nonpsychotic mental disorder   . Suicidal thoughts    Past Surgical History:  Procedure Laterality Date  . Appendectomy      HPI: Karen Casey is a 68 y.o. female that has a previous history of depression, anxiety, alcohol  use, AAF, COPD, GERD, and HTN, who presented to the ED for SI.She  was brought to the ED by EMS after pt called for help stating that she was having SI and wishes I was dead. Patient had three past attempts to end her life in the last year and she stated that she does not feel safe to dc home.  Disposition: TBD - inpatient BH   Food Insecurity: No Food Insecurity (02/07/2024)   Hunger Vital Sign   . Worried About Programme researcher, broadcasting/film/video in the Last Year: Never true   . Ran Out of Food in the Last Year: Never true  Recent Concern: Food Insecurity - Food Insecurity Present (02/06/2024)   Hunger Vital Sign   . Worried About Programme researcher, broadcasting/film/video in the Last Year: Often true   . Ran Out of Food in the Last Year: Often true     Ethnic/Cultural Food Beliefs: None noted  Food allergies: No known food allergies  Food & Nutrition Hx:    Dietary Orders  (From admission, onward)           Start     Ordered   03/29/24 0456  Regular Diet No metal on tray - Plastic Only  Diet effective now       Process Instructions: No data recorded     Question:  Kitchen Instructions  Answer:  No metal on tray - Plastic Only   03/29/24 0455           Review Flowsheet  More data exists      03/30/2024    8:37 AM 03/29/2024    8:58 PM 03/29/2024    5:22 PM  Meals and Suppliments  Percent Meals Eaten (%) 100 % - 100 %  Percent Supplements Eaten (%) - 100 % -     (03/30/24): Nutrition consult received per MD for abnormal labs and weight loss. Per RN documentation, PO intake averaging 88% of meals and 100% of 1 ONS documented since admission. Per weight history trends, weights appears stable with minimal fluctuations. Met with pt at bedside. Pt reports good appetite and that she was currently feeling hungry. Reported temporary difficulty with food access due to this months bills - attached food insecurity QR code to pt AVS. States she eats a lot of junk food,  but is able to eat full meals. Pt denies any weight loss and states a UBW of 130 lbs. Denies any N/V. Pt did report diarrhea yesterday and suspects it is related to medications. Pt reports no BM today. Pt denies any further nutrition concerns or questions at this time. Recommend continuing diet as ordered. Continue vitamins/minerals as ordered..  RD will continue to monitor and make further nutrition recommendations as appropriate.   Medications: Scheduled Meds: . amoxicillin -clavulanate  1 tablet Oral Q12H SCH  . budesonide -formoterol   2 puff Inhalation Q12H RSP   And  . umeclidinium bromide   1 puff Inhalation Daily RSP  . busPIRone   15 mg Oral TID  . cetirizine  10 mg Oral Daily  . clonazePAM   0.25 mg Oral BID  . DULoxetine  HCl  30 mg Oral Daily  .  ergocalciferol   50,000 Units Oral Weekly at 0900  . folic acid   1 mg Oral Daily  . gabapentin   800 mg Oral TID  . lactobacillus acidophilus  1 capsule Oral TID  . nicotine   1 patch Transdermal Daily  . oxybutynin   5 mg Oral HS  . pantoprazole  sodium  40 mg Oral Daily  . potassium chloride   20 mEq Oral Daily  . sodium chloride   1 g Oral MEALS  . THERA  1 tablet Oral Daily  . thiamine   100 mg Oral Daily  . topiramate  50 mg Oral BID   Continuous Infusions: PRN Meds:.acetaminophen , albuterol  sulfate HFA, hydrOXYzine  HCl, hydrOXYzine  HCl, LORazepam , LORazepam , nystatin -diphenhydramine-hydrocortisone , ondansetron , Polyvinyl Alcohol -Povidone PF, traZODone   Current Outpatient Medications  Medication Instructions  . acetaminophen  (TYLENOL ) 1,000 mg, 3 times a day  . albuterol  sulfate HFA (PROVENTIL ,VENTOLIN ,PROAIR ) 108 (90 Base) MCG/ACT inhaler 2 puffs, Inhalation, Every 6 hours as needed  . busPIRone  (BUSPAR ) 15 mg, Oral, 3 times a day  . clonazePAM  (KLONOPIN ) 0.25 mg, Oral, 2 times a day  . cyanocobalamin  1,000 mcg, Oral, Daily  . DULoxetine  HCl (CYMBALTA ) 30 mg, Oral, Daily  . ergocalciferol  (VITAMIN D2) 50,000 Units, Oral, Weekly at 0900  . folic acid  1 mg, Oral, Daily  . gabapentin  (NEURONTIN ) 800 mg, Oral, 3 times a day  . hydrOXYzine  HCl (ATARAX ) 25 mg, Oral, Daily as needed  . nicotine  (HABITROL ,NICODERM CQ ) 21 mg/24 hours 1 patch, Transdermal, Daily  . oxybutynin  (DITROPAN -XL) 5 mg, Oral, At bedtime  . pantoprazole  sodium (PROTONIX ) 40 mg, Oral, Daily  . THERA (THEREMS,ONE-DAILY) TABS 1 tablet, Oral, Daily  . Thiamine  Mononitrate 100 mg, Oral, Daily  . topiramate (TOPAMAX) 50 mg, 2 times a day  . traZODone  (DESYREL ) 150 mg, Oral, At bedtime as needed  . TRELEGY ELLIPTA  100-62.5-25 MCG/ACT AEPB inhaler 1 puff, Inhalation, Daily     Biochemical Data/Procedures/Medical Tests: Recent Labs    Units 03/30/24 0736 03/28/24 2031  NA mmol/L 136 132*  K mmol/L 4.3 3.2*  CL mmol/L  101 96*  CO2 mmol/L 24 19*  BUN mg/dL 12 9  CREATININE mg/dL 9.22 9.35  GLUCOSE mg/dL 77 88  CALCIUM  mg/dL 9.2 8.7    (89/82): Labs WDL   Lab Results  Component Value Date/Time   Alb 3.5 (L) 03/28/2024 08:31 PM    Hemoglobin A1c  Date Value Ref Range Status  02/08/2024 5.5 4.8 - 5.6 % Final  11/05/2023 5.0 4.8 - 5.6 % Final     POC Glucose: No results found for this or any previous visit (from the past 72 hours). Hemoglobin A1C:  Results for orders placed or performed during the hospital encounter  of 02/07/24  Hemoglobin A1c  Result Value Ref Range   Hemoglobin A1c 5.5 4.8 - 5.6 %   Narrative   Reference Interval:                  4.8 - 5.6% Increased Risk for Diabetes:         5.7 - 6.4% Diabetes:                             >=6.5% Glycemic Control for Adults with Diabetes:                        <7.0%   Results for orders placed or performed during the hospital encounter of 11/04/23  Hemoglobin A1c  Result Value Ref Range   Hemoglobin A1c 5.0 4.8 - 5.6 %   Narrative   Reference Interval:                  4.8 - 5.6% Increased Risk for Diabetes:         5.7 - 6.4% Diabetes:                             >=6.5% Glycemic Control for Adults with Diabetes:                        <7.0%    Lab Results  Component Value Date/Time   Vitamin B-12 303 02/08/2024 07:57 AM    Lab Results  Component Value Date/Time   Vit D, 25-Hydroxy 27.0 (L) 02/08/2024 07:57 AM   Pt receiving ergocalciferol  (10/17)  No results found for: FOLATE  No results found for: IRON  No results found for: ZINC       Nutrition Focused Physical Findings: I&O and Fluid Balance: Date 03/29/24 0700 - 03/30/24 0659 03/30/24 0700 - 03/31/24 0659  Shift 0700-1859 1900-0659 24 Hour Total 0700-1859 1900-0659 24 Hour Total  INTAKE  P.O. 720 240 960 300  300    P.O. 720 240 960 300  300  Shift Total(mL/kg) 720(11.9) 240(4) 960(15.9) 300(5)  300(5)  OUTPUT  Urine(mL/kg/hr)          Urine  Occurrence (unmeasured) 2 x 3 x 5 x     Shift Total(mL/kg)        NET 720 240 960 300  300  Weight (kg) 60.5 60.5 60.5 60.5 60.5 60.5      Gastrointestinal: c/o: Diarrhea per pt (10/17)  Last BM Date: 03/29/24      Skin: Breakdown Location/Integrity: per RN documentation   Skin Integrity: Other (Comment) (scraped on upper right arm and lower right leg) (03/29/24 2041)      Extremities/Edema: per RN documentation Peripheral Vascular (WDL): Within Defined Limits (03/29/24 2041)    Malnutrition Physical Findings: Adequately nourished on admit/not indicative of malnutrition at this time.  Malnutrition Physical Findings 10/17 Subcutaneous fat loss Upper Arm Region-Triceps (well nourished): ample fat tissue obvious between fold of skin Muscle loss Clavicle Bone Region (well-nourished): visible, but not prominent Adequately Nourished No Recent Unintentional Weight Loss: Assessed (stable/minimal weight fluctuations; pt denies weight loss) No Recent Changes To Oral Intake: Assessed (PO intake meeting >/= 75% of needs)  Anthropometric Measurements: Current Height: 162.6 cm (5' 4) Current Weight: 60.5 kg (133 lb 6.4 oz) (03/29/24 0400) Admit Weight: 60.5 kg (133 lb 6.4 oz) (03/29/24 0400) Admit  Ideal Body Weight: Female: IBW/kg (Calculated) FEMALE: 54.7 kg(+/-10%) % IBW: 111% Usual Body Weight: 130 lbs per pt  Body mass index is 22.9 kg/m. - WDL  Wt Readings from Last 10 Encounter:   03/29/24 0400 : 60.5 kg (133 lb 6.4 oz) 03/28/24 2004 : 59 kg (130 lb) 02/12/24 0945 : 64.9 kg (143 lb) 02/07/24 1847 : 63 kg (139 lb) 02/06/24 0125 : 63.2 kg (139 lb 6.4 oz) 02/05/24 1902 : 61.2 kg (135 lb) 11/06/23 0959 : 63.7 kg (140 lb 6.4 oz) 11/04/23 2241 : 64.2 kg (141 lb 8 oz) 11/03/23 2101 : 62.6 kg (138 lb)  Limited weight history provided within past year  6 lb weight loss x ~2 months - 4.3% (not clinically significant)    Weight During Admission     Row Name 03/29/24 0400            Weight 60.5 kg (133 lb 6.4 oz)         Comparative Standards: Based on 10/16 60.5 kg  Calories: 1513-1815 (25-30 cal/kg) Protein: 61-73 g (1-1.2 g/kg) Fluid: 25-30 mL/kg for maintenance or per MD Diagnosis:  Primary Nutrition Dx: Predicted suboptimal energy intake   related to decreased ability to consume adequate nutrition as evidenced by pt reports temporarily unable to access enough food   Secondary Nutrition Dx: N/A  Progress of Primary Nutrition Dx: Too early to determine progress at this time. Will determine upon reassessment.  Interventions:  Food & Nutrient Delivery: -- Meals & Snacks: Continue current diet as ordered / per MD   -- Vitamin & Mineral Supplements: Continue as ordered  -- Nutrition Related Medical Management: Encourage PO intake   Coordination of Care: -Collaboration with other providers: PRN - Nutrition Discharge Planning: Utilize calorie and protein fortified foods to optimize nutrient intake. - Interdisciplinary Nutrition Plan of Care: Initiated  Nutrition Education: - Content: Food insecurity QR coded attached to pt AVS Apr 05, 2024)  Nutrition Goals: - Meet >/= 75% of estimated needs through all resources/nutrient intake  within 7-10 days   Progress of Nutrition Goals:  -Too early to determine progress at this time. Will determine upon reassessment  Monitoring and Evaluation:  - Food/nutrition-related history: Food and nutrient intake, Fluid/beverage intake, Diet order, and Mealtime behaviors - Anthropometrics: weight and weight history - Biochemical data/medical tests/procedures: electrolytes, glucose, and as available - Nutrition-focused physical findings: appetite and GI - Comparative standards  Therisa Asal MS, RD, LDN  04/05/2024, 10:03 AM

## 2024-03-30 NOTE — Care Plan (Signed)
  Problem: Discharge Planning Goal: Knowledge of medical problems (What is my main problem?) Outcome: Progressing Goal: Knowledge of self care (What do I need to do when I go home?) Outcome: Progressing Goal: Knowledge of treatment plan (Why is it important for me to do this?) Outcome: Progressing Goal: Knowledge of medication management Outcome: Progressing   Problem: Discharge Planning, Adult/Pediatric Goal: Knowledge of medical problems (What is my main problem?) Outcome: Progressing Goal: Knowledge of self care (What do I need to do when I go home?) Outcome: Progressing Goal: Knowledge of treatment plan (Why is it important for me to do this?) Outcome: Progressing   Problem: Nutrition Goal: Nutritional intake, appropriate for patient status Description: Nutrition Goals: - Meet >/= 75% of estimated needs through all resources/nutrient intake  within 7-10 days  Outcome: Progressing   Problem: Pressure Injury Goal: Pressure injury healing Outcome: Progressing Goal: Absence of new pressure injury Outcome: Progressing

## 2024-03-30 NOTE — BH Treatment Plan (Signed)
 Patient Needs: evaluation and treatment for severe, recurrent depression, severe ETOH use disorder (is currently on CIWA protocol), recurrent suicidal thoughts (including 3 attempts this year).  Needs evaluation and support for significant psychosocial stressors, referrals to community supports, safety planning with family involvement (daughter).   Care Recommendations: see above. Recommend PACE, ETOH treatment, and daughter continuing to manage meds.  Needs help accessing food resources in the community. Coordinate care with OP psych provider.   Care Plan Reviewed: yes  Patient/Family Goals: to get my own place  Discharge Disposition Goals: Home  Progressing as Anticipated: no  Barriers to Goals: poor insight and coping, low will to live, severe alcoholism, food insecurity, conflict and stress in the home  Other Goal Discussion: LT Goal:  Pt will demonstrate stable and improved mood and will report being free of SI/death wishes.  ST Goals:  Will attend 2 therapy groups per day with active participation; will agree to family meeting as part of safety planning; will agree to some kind of rehab/treatment for alcohol  abuse.  Will meet these goals x 3 consecutive days.      Electronically Signed: Hoy LITTIE Peaks, [facility] 03/30/2024 11:02 AM
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
## 2024-03-30 NOTE — Progress Notes (Addendum)
 [facility] -  Inpatient Progress Note   Date of Service: 03/30/2024 Length of stay: Hospital Day: 2 Date of birth: [birth year] Assessment   Hospital Diagnoses: Principal Problem:   Major depressive disorder, recurrent, severe without psychotic features (*)    Karen Casey is a 68 y.o. female that has a previous history of depression, anxiety, alcohol  use, AAF, COPD, GERD, and HTN,  who presented for treatment of SI in the context of family stressors and lost of loved one .   Psychosocial concerns/strengths include: previous suicide attempt(s), has severe anxiety, has severe insomnia, hopelessness, substance abuse, and recent loss. Patient's strengths include: healthcare insurance, access to reliable transportation, high school/GED graduate, intact reality testing, involved family members, previous compliance with treatment, has hope for a better future, and believes they can get help.    Need for continued hospitalization:  justified due to continued danger to self and high probability of danger if discharged with imminent rehospitalization likely.    Treatment Plan   - Disposition:  - voluntary psychiatric hospitalization recommended for risk of self injury, severe agitation/assaultive, and substance dependent, requiring level 4 inpatient detoxification and continued psychiatric hospitalization justified due to continued danger to self and high probability of danger if discharged with imminent rehospitalization likely - Commitment Status: Voluntary - Estimated date of discharge:  - TBD - Precautions:  - suicide and fall, W/D - Pertinent Labs:  -  CMP: Na 132, K 3.2, Cl 96 , AGAP 17 , UDS : neg, Lipid panel Normal., Ethanol level 172, CBC HGB 10.7, RBC 3.90, RDW 52.6,  [id number]-455    - Psych Meds:            Continue CIWA protocol for alcohol  withdrawal             -Nicotine  Patch 21mg               - Increase  Klonopin  0.5   mg po bid ( kept to manage anxiety and alcohol  w/d Sx )./  Increase Cymbalta  increased to 60 mg po am / Topamax 50 mg po bid to manage headache  / Gabapentin  800 mg po tid ( manage pain ).  Increase Trazodone  to 150 mg po hs.   -  - Medical Recs:  - done   Chief Complaint   I use to take highest Trazodone  doses .  Interval History   Patient was seen today for re-evaluation.  Nursing reports  -Group: Pt min engaged in grp discussion and provided little input. W/d to self. Had her head down a lot of grp session. Unreceptive to encouragement to engage . -Patient alert and oriented x4. Calm and cooperative while lying in bed. Denies pain at this time. Denies SI/HI and AVH. Endorses anxiety and depression.  Slept 7.5 h. -Nutritional Assessment done.    The patient reports minor issues with performing ADLs.  Patient has been medication compliant.  The patient reports no side effects from medications.  Current symptoms being addressed include:  withdrawal, depression, anxiety, and mood stabilization.  Since last assessment, patient reports symptoms have stayed the same.    On assessment today , patient is seen in her room, she is alert , aware, no cognitive distortions, she asks for increase her Klonopin  doses , and she required prn Trazodone  at night .  Patient has disheveled appearance, she is alert, verbal, but keeps a depressive demeanor . She is limited visible in the milieu .Logic and coherent thought process.  Current suicidal/homicidal ideations: Denies Current auditory/visual hallucinations: Denies Plan : increase Trazodone  to 150 mg po hs Increase Klonopin  to 0.5 mg po bid  Increase cymbalta  to 60 mg po am   Review Of Systems: A complete review of systems of the following systems was conducted (Constitutional, Psychiatric, Neurological, Musculoskeletal, Eyes, Gastrointestinal, Cardiovascular, Respiratory, Skin, and Endocrine). All reviewed systems are negative except pertinent  positives identified in the HPI.    Last BM Date: 03/29/24   Past History   Past Medical History, Past Surgery History, Allergies, Social History, and Family History were reviewed and updated as appropriate.    Social History:  Social History[1]  Evaluation   Vitals:  Vitals:   03/30/24 1245  BP: (!) 144/95  Pulse: 80  Resp:   Temp:   SpO2:     Medications: . amoxicillin -clavulanate  1 tablet Oral Q12H SCH  . budesonide -formoterol   2 puff Inhalation Q12H RSP   And  . umeclidinium bromide   1 puff Inhalation Daily RSP  . busPIRone   15 mg Oral TID  . cetirizine  10 mg Oral Daily  . clonazePAM   0.5 mg Oral BID  . [START ON 03/31/2024] DULoxetine  HCl  60 mg Oral Daily  . ergocalciferol   50,000 Units Oral Weekly at 0900  . folic acid   1 mg Oral Daily  . gabapentin   800 mg Oral TID  . lactobacillus acidophilus  1 capsule Oral TID  . nicotine   1 patch Transdermal Daily  . oxybutynin   5 mg Oral HS  . pantoprazole  sodium  40 mg Oral Daily  . potassium chloride   20 mEq Oral Daily  . sodium chloride   1 g Oral MEALS  . THERA  1 tablet Oral Daily  . thiamine   100 mg Oral Daily  . topiramate  50 mg Oral BID  . traZODone   150 mg Oral HS   acetaminophen , albuterol  sulfate HFA, hydrOXYzine  HCl, hydrOXYzine  HCl, LORazepam , LORazepam , nystatin -diphenhydramine-hydrocortisone , ondansetron , Polyvinyl Alcohol -Povidone PF  Labs: Recent Results (from the past 72 hours)  CBC And Differential   Collection Time: 03/28/24  8:31 PM  Result Value Ref Range   WBC 6.1 4.0 - 10.5 thou/mcL   RBC 3.90 (L) 3.93 - 5.22 million/mcL   HGB 10.7 (L) 11.2 - 15.7 gm/dL   HCT 65.6 65.8 - 55.0 %   MCV 87.9 79.4 - 94.8 fL   MCH 27.4 25.6 - 32.2 pg   MCHC 31.2 (L) 32.2 - 35.5 gm/dL   Plt Ct 732 849 - 599 thou/mcL   RDW SD 52.6 (H) 35.1 - 46.3 fL   MPV 9.5 9.4 - 12.4 fL   NRBC% 0.0 0.0 - 0.2 /100WBC   Absolute NRBC Count 0.00 0.00 - 0.01 thou/mcL   NEUTROPHIL % 58.3 %   LYMPHOCYTE % 30.6 %   MONOCYTE  % 8.3 %   Eosinophil % 1.8 %   BASOPHIL % 0.5 %   IG% 0.5 %   ABSOLUTE NEUTROPHIL COUNT 3.58 1.50 - 7.50 thou/mcL   ABSOLUTE LYMPHOCYTE COUNT 1.88 1.00 - 4.50 thou/mcL   Absolute Monocyte Count 0.51 0.10 - 0.80 thou/mcL   Absolute Eosinophil Count 0.11 0.00 - 0.50 thou/mcL   Absolute Basophil Count 0.03 0.00 - 0.20 thou/mcL   Absolute Immature Granulocyte Count 0.03 0.00 - 0.03 thou/mcL   Comprehensive metabolic panel   Collection Time: 03/28/24  8:31 PM  Result Value Ref Range   Na 132 (L) 136 - 146 mmol/L   Potassium 3.2 (L) 3.7 - 5.4 mmol/L  Cl 96 (L) 97 - 108 mmol/L   CO2 19 (L) 20 - 32 mmol/L   AGAP 17 (H) 7 - 16 mmol/L   Glucose 88 65 - 99 mg/dL   BUN 9 8 - 27 mg/dL   Creatinine 9.35 9.42 - 1.00 mg/dL   Ca 8.7 8.6 - 89.7 mg/dL   ALK PHOS 98 25 - 834 U/L   T Bili <0.2 0.0 - 1.2 mg/dL   Total Protein 6.9 6.0 - 8.5 gm/dL   Alb 3.5 (L) 3.6 - 4.8 gm/dL   GLOBULIN 3.4 1.5 - 4.5 gm/dL   ALBUMIN/GLOBULIN RATIO 1.0 (L) 1.1 - 2.5   BUN/CREAT RATIO 14.1 11.0 - 26.0   ALT 7 0 - 40 U/L   AST 13 0 - 40 U/L   eGFR 98 >=60 mL/min/1.110m2  Ethanol level   Collection Time: 03/28/24  8:31 PM  Result Value Ref Range   Ethanol 172 0 mg/dL  Aspirin  (ASA) Level   Collection Time: 03/28/24  8:33 PM  Result Value Ref Range   Salicylate <3.0 (L) 30.0 - 250.0 mcg/mL  Acetaminophen  (APAP) Level   Collection Time: 03/28/24  8:33 PM  Result Value Ref Range   Acetaminophen  6.5 (L) 10.0 - 25.0 mcg/mL  ECG 12 lead   Collection Time: 03/28/24  9:06 PM  Result Value Ref Range   Acquisition Device D3K    Systolic BP 108 mmHg   Diastolic BP 70 mmHg   Ventricular Rate 83 BPM   Atrial Rate 83 BPM   P-R Interval 158 ms   QRS Duration 72 ms   Q-T Interval 388 ms   QTC Calculation(Bazett) 455 ms   Calculated P Axis 60 degrees   Calculated R Axis 26 degrees   Calculated T Axis 57 degrees   ECG Diagnosis      Normal sinus rhythm Wahid, Asif (1403) on 03/30/2024 9:42:33 AM certifies that he/she  has reviewed the ECG tracing and confirms the independent interpretation is correct.   Urine Drug Screen   Collection Time: 03/28/24 10:29 PM  Result Value Ref Range   Ur PH DOA Scr 6.5 4.5 - 9.0   Amphet Scr Negative Negative   Barb Scr Negative Negative   Benzo Scr Negative Negative   Cannab Scr Negative Negative   Cocaine Scr Negative Negative   Opiates Scr Negative Negative   Meth Scr Negative Negative   Oxyco Scr Negative Negative   Fentanyl  Scr Negative Negative   Buprenorphine Screen Negative Negative  Lipid Panel   Collection Time: 03/29/24  7:46 AM  Result Value Ref Range   CHOLESTEROL TOTAL 110 100 - 199 mg/dL   Trig 99 0 - 850 mg/dL   LDL 44 0 - 99 mg/dL   VLDL 20 5 - 40 mg/dl   CHOL/HDL 2 0 - 5   HDL 46 >=39 mg/dL  RNCPI-80, Flu A+B and RSV Nasopharyngeal Swab Nasopharyngeal Swab   Collection Time: 03/29/24 12:02 PM  Result Value Ref Range   Flu A Negative Negative   Flu B Negative Negative   RSV PCR Negative Negative   SARS-COV-2 Not Detected Not Detected  TSH   Collection Time: 03/30/24  7:36 AM  Result Value Ref Range   TSH 2.83 0.45 - 4.50 mcIU/mL  Free T4   Collection Time: 03/30/24  7:36 AM  Result Value Ref Range   Free T4 1.23 0.82 - 1.77 ng/dL  Basic Metabolic Panel   Collection Time: 03/30/24  7:36 AM  Result Value  Ref Range   Na 136 136 - 146 mmol/L   Potassium 4.3 3.7 - 5.4 mmol/L   Cl 101 97 - 108 mmol/L   CO2 24 20 - 32 mmol/L   AGAP 11 7 - 16 mmol/L   Glucose 77 65 - 99 mg/dL   BUN 12 8 - 27 mg/dL   Creatinine 9.22 9.42 - 1.00 mg/dL   Ca 9.2 8.6 - 89.7 mg/dL   BUN/CREAT RATIO 84.3 11.0 - 26.0   eGFR 86 >=60 mL/min/1.60m2    Imaging Results: No results found.  No results found for this visit on 03/29/24.  Anticonvulsant Drug Levels: No results found for this or any previous visit (from the past 72 weeks).  Mental Status Evaluation   Constitutional:   General Appearance Casually dressed , Disheveled, and Underweight  General  Behavior Guarded , Withdrawn, and Good eye contact  Musculoskeletal:   Gait and Station No gait abnormalities  and Unsteady  Strength and tone Normal  Psychiatric:   Psychomotor Activity No motor abnormalities  Speech  Soft, Slow, and Latency  Mood  Dysthymic, Dysphoric, and Depressed  Affect  Restricted and Depressed  Thought Process Self-referential and Concrete  Associations Intact association  Thought Content/Perceptual Disturbances Patient denies suicidal/homicidal ideation; No evidence of auditory/visual hallucinations or delusions;  Cognition/Sensorium  Attention: Not intact  Insight  Fair  Judgment Fair   Diagnostic results, Prognosis, Risks and benefits of treatment options, instructions for treatment and/or follow-up, importance of adherence to chosen treatment, Risk factor reduction, and patient and family education.  Electronically signed by: Danella Raejean Games, MD, MD 03/30/2024 1:34 PM         [1] Social History Socioeconomic History  . Marital status: Widowed  Tobacco Use  . Smoking status: Every Day    Types: Cigarettes  . Smokeless tobacco: Never  Vaping Use  . Vaping status: Never Used  Substance and Sexual Activity  . Alcohol  use: Yes    Alcohol /week: 10.0 - 18.0 standard drinks of alcohol     Types: 10 - 18 Standard drinks or equivalent per week  . Drug use: Not Currently    Types: Marijuana, Benzodiazepines

## 2024-03-30 NOTE — Care Plan (Signed)
  Problem: Nutrition Goal: Nutritional intake, appropriate for patient status Description: Nutrition Goals: - Meet >/= 75% of estimated needs through all resources/nutrient intake  within 7-10 days  Note: Nutrition Goals: - Meet >/= 75% of estimated needs through all resources/nutrient intake  within 7-10 days

## 2024-04-09 ENCOUNTER — Ambulatory Visit: Admitting: Family Medicine

## 2024-04-09 ENCOUNTER — Telehealth: Payer: Self-pay

## 2024-04-09 DIAGNOSIS — E871 Hypo-osmolality and hyponatremia: Secondary | ICD-10-CM

## 2024-04-09 DIAGNOSIS — G621 Alcoholic polyneuropathy: Secondary | ICD-10-CM | POA: Diagnosis not present

## 2024-04-09 DIAGNOSIS — K0889 Other specified disorders of teeth and supporting structures: Secondary | ICD-10-CM

## 2024-04-09 DIAGNOSIS — F332 Major depressive disorder, recurrent severe without psychotic features: Secondary | ICD-10-CM

## 2024-04-09 DIAGNOSIS — F1090 Alcohol use, unspecified, uncomplicated: Secondary | ICD-10-CM

## 2024-04-09 DIAGNOSIS — Z720 Tobacco use: Secondary | ICD-10-CM

## 2024-04-09 MED ORDER — CLONAZEPAM 0.5 MG PO TABS: 0.2500 mg | ORAL_TABLET | Freq: Two times a day (BID) | ORAL | Status: AC | PRN

## 2024-04-09 MED ORDER — TRAZODONE HCL 50 MG PO TABS: 150.0000 mg | ORAL_TABLET | Freq: Every evening | ORAL | Status: DC | PRN

## 2024-04-09 MED ORDER — GABAPENTIN 400 MG PO CAPS: 800.0000 mg | ORAL_CAPSULE | Freq: Three times a day (TID) | ORAL | 1 refills | Status: DC

## 2024-04-09 MED ORDER — NICOTINE POLACRILEX 2 MG MT GUM: 2.0000 mg | CHEWING_GUM | OROMUCOSAL | 0 refills | Status: AC | PRN

## 2024-04-09 NOTE — Transitions of Care (Post Inpatient/ED Visit) (Signed)
   04/09/2024  Name: Karen Casey MRN: [id number] DOB: [birth year]  Today's TOC FU Call Status: Today's TOC FU Call Status:: Unsuccessful Call (2nd Attempt) Unsuccessful Call (2nd Attempt) Date: 04/09/24  Attempted to reach the patient regarding the most recent Inpatient/ED visit.  Follow Up Plan: Additional outreach attempts will be made to reach the patient to complete the Transitions of Care (Post Inpatient/ED visit) call.   Shona Prow RN, CCM [place]  VBCI-Population Health RN Care Manager [phone]

## 2024-04-09 NOTE — Assessment & Plan Note (Signed)
 Denies SI/HI today, feels mood is doing well Appt tomorrrow to establish care at [facility], has a ride Continue home medications, prescribed by psychiatry Discussed safety planning if she starts to have thoughts of not wanting to be here- she does crafts, distracts herself, pets her cat

## 2024-04-09 NOTE — Assessment & Plan Note (Signed)
 Substituting candy for cigarettes, requesting nicotine  gum Discussed tapering strategies and quit date of 05/10/24 (her birthday!)

## 2024-04-09 NOTE — Assessment & Plan Note (Signed)
 Taking salt tabs, recommended schedule lab visit in 1 week to recheck Na 132 on discharge

## 2024-04-09 NOTE — Assessment & Plan Note (Signed)
 Notes abstaining from use, congratulated patient!

## 2024-04-09 NOTE — Assessment & Plan Note (Signed)
 Refilled gabapentin

## 2024-04-09 NOTE — Transitions of Care (Post Inpatient/ED Visit) (Signed)
   04/09/2024  Name: Karen Casey MRN: [id number] DOB: 12/03/1955  Today's TOC FU Call Status: Today's TOC FU Call Status:: Unsuccessful Call (1st Attempt) Unsuccessful Call (1st Attempt) Date: 04/09/24  Attempted to reach the patient regarding the most recent Inpatient/ED visit.  Follow Up Plan: Additional outreach attempts will be made to reach the patient to complete the Transitions of Care (Post Inpatient/ED visit) call.   Shona Prow RN, CCM [place]  VBCI-Population Health RN Care Manager [phone]

## 2024-04-09 NOTE — Progress Notes (Signed)
 [facility] Telemedicine Visit  Patient consented to have virtual visit and was identified by name and date of birth. Method of visit: Telephone  Encounter participants: Patient: Karen Casey - located at daughters house Provider: Rollene FORBES Keeling - located at [facility] Others (if applicable): n/a  Chief Complaint: hospital follow up  HPI:  Notes went to hospital for toothache. Tough weekend prior. Started on antibiotics. Has dentist appt Friday. Tooth pain resolved now on antibiotics, been able to eat. Notes she was coughing as well, now better on antibiotics. No fevers or chills. No shortness of breath or chest pain. Using inhalers. Completed antibiotics now. Normal BM now.   Notes her Cat Baby Boy is supportive. Living back at home with her daughter is going well. Patient denies SI at previous hospitalization and denies SI today. Describes her mood as relaxed. Notes her daughter is taking her to [facility] appointment tomorrow. Notes safety plan for deep breathing, doing crafts (making wreaths for Halloween and thanksgiving), been drawing.   Recently admitted 03/29/24 - 04/06/24 at [facility]. Discharged on Buspar  20mg  TID, clonazepam  0.25mg  BID (30 tablets of 0.5 mg given on 04/06/24 per PDMP), Duloxetine  30mg  daily, hydroxyzine  25mg  daily as needed, trazodone  150mg  at bedtime for insomnia. Appt on Tuesday October 28th at 8am with [facility] in [place]. Referred to ACTT services as well.   Hyponatremia- Na 132 on discharge, taking salt tablets 1g TID.  Tobacco use- eating bits of honey candy instead of smoking. Down to 1/2 ppd. Trying to eat candy instead of smoking. Discussed a quit date- considering her birthday.   ROS: per HPI  Pertinent PMHx: MDD, history of suicide attempt x3   Exam:   Respiratory: speaking in complete sentences, no coughing  Assessment/Plan:  Assessment & Plan Alcohol -induced  polyneuropathy Refilled gabapentin  Severe recurrent major depression without psychotic features (HCC) Denies SI/HI today, feels mood is doing well Appt tomorrrow to establish care at [facility], has a ride Continue home medications, prescribed by psychiatry Discussed safety planning if she starts to have thoughts of not wanting to be here- she does crafts, distracts herself, pets her cat Hyponatremia Taking salt tabs, recommended schedule lab visit in 1 week to recheck Na 132 on discharge Tooth pain Improved, has dentist appt Friday, no systemic symptoms, able to eat/drink Alcohol  use Notes abstaining from use, congratulated patient! Tobacco use Substituting candy for cigarettes, requesting nicotine  gum Discussed tapering strategies and quit date of 05/10/24 (her birthday!)    Time spent during visit with patient: 18 minutes

## 2024-04-10 ENCOUNTER — Telehealth: Payer: Self-pay

## 2024-04-10 NOTE — Transitions of Care (Post Inpatient/ED Visit) (Signed)
   04/10/2024  Name: Karen Casey MRN: [id number] DOB: [birth year]  Today's TOC FU Call Status: Today's TOC FU Call Status:: Unsuccessful Call (3rd Attempt) Unsuccessful Call (3rd Attempt) Date: 04/10/24  Attempted to reach the patient regarding the most recent Inpatient/ED visit.  Follow Up Plan: No further outreach attempts will be made at this time. We have been unable to contact the patient.  Shona Prow RN, CCM [place]  VBCI-Population Health RN Care Manager [phone]

## 2024-05-01 ENCOUNTER — Telehealth: Payer: Self-pay

## 2024-05-01 NOTE — Telephone Encounter (Signed)
 I have messaged [facility] about the declined referral Suzann Daring, MD  [facility] Medicine Teaching Service

## 2024-05-01 NOTE — Telephone Encounter (Signed)
 I am unable to see who or why the referral was denied ( Only Psych offices can see that info).  The best option would be to send her a list of psychiatrist to call.    Dr. Donzetta where can I find that also do you think the patient will call? Harlene Carte, CMA

## 2024-05-01 NOTE — Telephone Encounter (Signed)
 Patient calls nurse line in regards to Psychiatry referral.   She reports she has not heard from anyone in regards to this.   Per referral, it appears this was denied. However, unsure.   Will forward to referral coordinator for more information.

## 2024-05-13 NOTE — Nursing Note (Signed)
  Patient is A&O x4. She has been cooperative throughout the shift. Compliant with medications.Up ad lib with walker and independent with ADL's. Denies any SI, HI, or AVH at this time. Slept 5.5 hours during the shift.      05/12/24 2300  Purposeful Rounding  Purposeful Rounding Yes  Affect/Mood  Affect Range /Display normal range  Mood Range /Display Normal range  Affect/Mood Display Appropriate  Mood euthymic  Thought Process/Psychosis  Coherency (the How) Coherent and organized  Content (the What) Rational, organized, no evidence of psychosis  Delusions None  Hallucinations None  MOAS+  Verbal Aggression 0  Aggression against Property 0  Aggression against Self 0  Sexual Aggression 0  Aggression against Others 0  MOAS+ Score 0  Behavior  Eye Contact Good  Exhibited Behavior cooperative  Language and Speech  Speech Content Appropriate  Safety Attendant  Safety Attendant Not indicated  Suicide Shift Reassessment  1. Have you actually had thoughts about killing yourself? No  5. Have you done anything, started to do anything, or prepared to do anything to end your life? No  Shift Reassessment Risk Score: No Risk  Search (select all that apply) Room Search  Reason for Search Precautions  Search Outcome? Contraband Not Found  BEDSIDE MOBILITY ASSESSMENT TOOL  Assessment Level 1 Pass  Assessment Level 2 Pass  Assessment Level 3 Pass  Assessment Level 4 Pass  BMAT Level BMAT level 4 Ambulation Device as Needed

## 2024-05-13 NOTE — Group Note (Signed)
  Group Therapy Note   Group Date/Time: 05/13/2024 1000 - 1100 Group Topic:  BH Rec Therapy  Group Department: [facility]  Group Facilitator: Harlene LOISE Baller, LRT CTRS  Group Subtopic : [employer], coping skills, music, reminiscing   Patient Problem or Treatment Goal Addressed: ineffective coping  Teaching Methods/Interventions: Encouraged, Explained, Demonstrated, and Group Discussion  Audio Visual Played (if applicable):   Written Material Given (if applicable):   Behavior: Sociable and Calm  Affect/Mood: Euthymic  Thoughts: Clear  Readiness to Learn/Patient Participation Level: Active  Attention Span: Alert  Barriers to Learning: None=no barriers  Patient/Family Response: Verbalizes Understanding of Information  Care Plan Updated?  No  Electronically signed: Harlene LOISE Baller, LRT CTRS 05/13/2024 / 1:30 PM

## 2024-05-14 ENCOUNTER — Telehealth: Payer: Self-pay

## 2024-05-14 NOTE — Transitions of Care (Post Inpatient/ED Visit) (Signed)
   05/14/2024  Name: Karen Casey MRN: [id number] DOB: 23-Dec-1955  Today's TOC FU Call Status: Today's TOC FU Call Status:: Unsuccessful Call (1st Attempt) Unsuccessful Call (1st Attempt) Date: 05/14/24  Attempted to reach the patient regarding the most recent Inpatient/ED visit. Left confidential voice mail on identified cell phone with RN CM call back number given.   Follow Up Plan: Additional outreach attempts will be made to reach the patient to complete the Transitions of Care (Post Inpatient/ED visit) call.    Bing Edison MSN, RN RN [employer]  VBCI-Population Health Office Hours M-F [phone] Direct Dial: [phone] Main Phone [phone]  Fax: [phone] [email or web address]

## 2024-05-15 ENCOUNTER — Telehealth: Payer: Self-pay | Admitting: *Deleted

## 2024-05-15 NOTE — Transitions of Care (Post Inpatient/ED Visit) (Signed)
   05/15/2024  Name: Karen Casey MRN: [id number] DOB: 03/29/1956  Today's TOC FU Call Status: Today's TOC FU Call Status:: Unsuccessful Call (2nd Attempt) Unsuccessful Call (2nd Attempt) Date: 05/15/24  Attempted to reach the patient regarding the most recent Inpatient/ED visit.  Follow Up Plan: No further outreach attempts will be made at this time. We have been unable to contact the patient.  Cathlean Headland BSN RN  [facility] Health Care Management Coordinator [email or web address] Direct Dial: [phone]  Fax: [phone] Website: [email or web address]

## 2024-05-16 ENCOUNTER — Telehealth: Payer: Self-pay

## 2024-05-16 NOTE — Transitions of Care (Post Inpatient/ED Visit) (Addendum)
   05/16/2024  Name: Karen Casey MRN: [id number] DOB: 03-Nov-1955  Today's TOC FU Call Status: Today's TOC FU Call Status:: Unsuccessful Call (3rd Attempt) Unsuccessful Call (3rd Attempt) Date: 05/16/24  Attempted to reach the patient regarding the most recent Inpatient/ED visit.  Follow Up Plan: Additional outreach attempts will be made to reach the patient to complete the Transitions of Care (Post Inpatient/ED visit) call.    Bing Edison MSN, RN RN [employer]  VBCI-Population Health Office Hours M-F [phone] Direct Dial: [phone] Main Phone [phone]  Fax: [phone] [email or web address]

## 2024-05-22 ENCOUNTER — Other Ambulatory Visit: Payer: Self-pay

## 2024-05-22 DIAGNOSIS — G621 Alcoholic polyneuropathy: Secondary | ICD-10-CM

## 2024-05-22 MED ORDER — GABAPENTIN 400 MG PO CAPS: 800.0000 mg | ORAL_CAPSULE | Freq: Three times a day (TID) | ORAL | 1 refills | Status: DC

## 2024-05-22 MED ORDER — TRAZODONE HCL 50 MG PO TABS: 150.0000 mg | ORAL_TABLET | Freq: Every evening | ORAL | 0 refills | Status: AC | PRN

## 2024-05-22 NOTE — Telephone Encounter (Signed)
 Patient calls nurse line for several reasons.   She is asking for refills on trazodone  and gabapentin . She is also requesting refill on oxybutynin , however, this is not on current medication list. Advised patient that she may need follow up appointment to further discuss her medications prior to Dr. Donzetta prescribing this.  Scheduled patient for follow up on Friday, 05/25/24. Patient is also asking about status of referral to psychiatrist. Per chart review, referral was sent on 03/22/24, however, status indicated this was denied. Will forward to [facility] for an update.   Chiquita JAYSON English, RN

## 2024-05-25 ENCOUNTER — Ambulatory Visit: Admitting: Family Medicine

## 2024-05-29 ENCOUNTER — Telehealth: Payer: Self-pay | Admitting: Nurse Practitioner

## 2024-06-19 ENCOUNTER — Other Ambulatory Visit: Payer: Self-pay

## 2024-06-19 DIAGNOSIS — G621 Alcoholic polyneuropathy: Secondary | ICD-10-CM

## 2024-06-19 DIAGNOSIS — J438 Other emphysema: Secondary | ICD-10-CM

## 2024-06-19 MED ORDER — GABAPENTIN 400 MG PO CAPS: 800.0000 mg | ORAL_CAPSULE | Freq: Three times a day (TID) | ORAL | 1 refills | Status: AC

## 2024-06-19 MED ORDER — ALBUTEROL SULFATE HFA 108 (90 BASE) MCG/ACT IN AERS: 2.0000 | INHALATION_SPRAY | Freq: Four times a day (QID) | RESPIRATORY_TRACT | 0 refills | Status: AC | PRN

## 2024-06-19 MED ORDER — TRELEGY ELLIPTA 100-62.5-25 MCG/ACT IN AEPB: 1.0000 | INHALATION_SPRAY | Freq: Every day | RESPIRATORY_TRACT | 2 refills | Status: AC

## 2024-06-21 ENCOUNTER — Ambulatory Visit

## 2024-06-21 VITALS — Ht 65.0 in | Wt 137.0 lb

## 2024-06-21 DIAGNOSIS — Z0000 Encounter for general adult medical examination without abnormal findings: Secondary | ICD-10-CM

## 2024-06-21 NOTE — Patient Instructions (Signed)
 Karen Casey,  Thank you for taking the time for your Medicare Wellness Visit. I appreciate your continued commitment to your health goals. Please review the care plan we discussed, and feel free to reach out if I can assist you further.  Please note that Annual Wellness Visits do not include a physical exam. Some assessments may be limited, especially if the visit was conducted virtually. If needed, we may recommend an in-person follow-up with your provider.  Ongoing Care Seeing your primary care provider every 3 to 6 months helps us  monitor your health and provide consistent, personalized care.   Referrals If a referral was made during today's visit and you haven't received any updates within two weeks, please contact the referred provider directly to check on the status.  Recommended Screenings:  Health Maintenance  Topic Date Due   Zoster (Shingles) Vaccine (1 of 2) Never done   Breast Cancer Screening  02/05/2023   Medicare Annual Wellness Visit  10/22/2023   Screening for Lung Cancer  01/28/2024   COVID-19 Vaccine (5 - 2025-26 season) 02/13/2024   Colon Cancer Screening  03/12/2024   DTaP/Tdap/Td vaccine (2 - Td or Tdap) 04/18/2031   Pneumococcal Vaccine for age over 68  Completed   Flu Shot  Completed   Osteoporosis screening with Bone Density Scan  Completed   Hepatitis C Screening  Completed   Meningitis B Vaccine  Aged Out       06/21/2024    1:36 PM  Advanced Directives  Does Patient Have a Medical Advance Directive? No  Would patient like information on creating a medical advance directive? No - Patient declined    Vision: Annual vision screenings are recommended for early detection of glaucoma, cataracts, and diabetic retinopathy. These exams can also reveal signs of chronic conditions such as diabetes and high blood pressure.  Dental: Annual dental screenings help detect early signs of oral cancer, gum disease, and other conditions linked to overall health,  including heart disease and diabetes.  Please see the attached documents for additional preventive care recommendations.

## 2024-06-21 NOTE — Progress Notes (Signed)
 "  Chief Complaint  Patient presents with   Medicare Wellness    SUBSEQUENT     Subjective:   Karen Casey is a 69 y.o. female who presents for a Medicare Annual Wellness Visit.  Visit info / Clinical Intake: Medicare Wellness Visit Type:: Subsequent Annual Wellness Visit Persons participating in visit and providing information:: patient Medicare Wellness Visit Mode:: Telephone If telephone:: video error Since this visit was completed virtually, some vitals may be partially provided or unavailable. Missing vitals are due to the limitations of the virtual format.: Documented vitals are patient reported If Telephone or Video please confirm:: I connected with patient using audio/video enable telemedicine. I verified patient identity with two identifiers, discussed telehealth limitations, and patient agreed to proceed. Patient Location:: Home Provider Location:: Home Office Interpreter Needed?: No Pre-visit prep was completed: yes AWV questionnaire completed by patient prior to visit?: no Living arrangements:: with family/others (Lives with her sister) Patient's Overall Health Status Rating: (!) fair Typical amount of pain: (!) a lot Does pain affect daily life?: (!) yes Are you currently prescribed opioids?: no  Dietary Habits and Nutritional Risks How many meals a day?: 2 Eats fruit and vegetables daily?: yes Most meals are obtained by: preparing own meals In the last 2 weeks, have you had any of the following?: none Diabetic:: no  Functional Status Activities of Daily Living (to include ambulation/medication): Independent Ambulation: Independent with device- listed below Home Assistive Devices/Equipment: Walker (specify Type); Other (Comment) (OTC Reading Glasses) Medication Administration: Independent Home Management (perform basic housework or laundry): Independent Manage your own finances?: yes Primary transportation is: family / friends Concerns about vision?: no  *vision screening is required for WTM* Concerns about hearing?: no  Fall Screening Falls in the past year?: 1 Number of falls in past year: 1 Was there an injury with Fall?: 0 Fall Risk Category Calculator: 2 Patient Fall Risk Level: Moderate Fall Risk  Fall Risk Patient at Risk for Falls Due to: Impaired balance/gait Fall risk Follow up: Falls evaluation completed; Education provided  Home and Transportation Safety: All rugs have non-skid backing?: N/A, no rugs All stairs or steps have railings?: N/A, no stairs Grab bars in the bathtub or shower?: (!) no Have non-skid surface in bathtub or shower?: (!) no Good home lighting?: yes Regular seat belt use?: yes Hospital stays in the last year:: no  Cognitive Assessment Difficulty concentrating, remembering, or making decisions? : no Will 6CIT or Mini Cog be Completed: yes What year is it?: 0 points What month is it?: 0 points Give patient an address phrase to remember (5 components): [address] [place] About what time is it?: 0 points Count backwards from 20 to 1: 0 points Say the months of the year in reverse: 0 points Repeat the address phrase from earlier: 0 points 6 CIT Score: 0 points  Advance Directives (For Healthcare) Does Patient Have a Medical Advance Directive?: No Would patient like information on creating a medical advance directive?: No - Patient declined  Reviewed/Updated  Reviewed/Updated: Reviewed All (Medical, Surgical, Family, Medications, Allergies, Care Teams, Patient Goals)    Allergies (verified) Capsaicin -menthol and Diclo gel [diclofenac  sodium]   Current Medications (verified) Outpatient Encounter Medications as of 06/21/2024  Medication Sig   albuterol  (VENTOLIN  HFA) 108 (90 Base) MCG/ACT inhaler Inhale 2 puffs into the lungs every 6 (six) hours as needed for wheezing or shortness of breath. INHALE 2 PUFFS BY MOUTH EVERY 6 HOURS AS NEEDED FOR WHEEZING FOR SHORTNESS OF BREATH  busPIRone  (BUSPAR ) 10 MG tablet Take 20 mg by mouth 3 (three) times daily.   cholecalciferol  (VITAMIN D3) 25 MCG (1000 UNIT) tablet Take 1,000 Units by mouth daily.   clonazePAM  (KLONOPIN ) 0.5 MG tablet Take 0.5 tablets (0.25 mg total) by mouth 2 (two) times daily as needed for anxiety. Ok to fill 12/29/22   DULoxetine  (CYMBALTA ) 30 MG capsule Take 30 mg by mouth daily.   Fluticasone -Umeclidin-Vilant (TRELEGY ELLIPTA ) 100-62.5-25 MCG/ACT AEPB Inhale 1 puff into the lungs daily.   gabapentin  (NEURONTIN ) 400 MG capsule Take 2 capsules (800 mg total) by mouth 3 (three) times daily.   nicotine  polacrilex (FT NICOTINE ) 2 MG gum Take 1 each (2 mg total) by mouth as needed for smoking cessation.   ondansetron  (ZOFRAN -ODT) 4 MG disintegrating tablet Take 1 tablet (4 mg total) by mouth every 8 (eight) hours as needed for nausea or vomiting.   pantoprazole  (PROTONIX ) 40 MG tablet Take 1 tablet (40 mg total) by mouth daily.   sodium chloride  1 g tablet Take 1 g by mouth 3 (three) times daily with meals.   traZODone  (DESYREL ) 50 MG tablet Take 3 tablets (150 mg total) by mouth at bedtime as needed for sleep.   folic acid  (FOLVITE ) 1 MG tablet Take 1 tablet (1 mg total) by mouth daily. (Patient not taking: Reported on 02/27/2024)   [DISCONTINUED] hydrOXYzine  (ATARAX ) 10 MG tablet TAKE 1 TABLET BY MOUTH THREE TIMES A DAY AS NEEDED FOR ANXIETY   No facility-administered encounter medications on file as of 06/21/2024.    History: Past Medical History:  Diagnosis Date   Alcohol  abuse    last use 03/09/21, marijuana last 03/09/21   Allergy    Anxiety    Cataract 06/09/2012   Right eye and left eye   COPD (chronic obstructive pulmonary disease) (HCC)    Depression    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/04/2020   Due to benzodiazepine withdrawal   Dyspnea    uses oxygen  2L via [place] prn   Enterococcal bacteremia 02/10/2022   GERD (gastroesophageal reflux disease)    Headache    Hypertension     Long term (current) use of anticoagulants    Nausea vomiting and diarrhea 02/09/2022   Neuromuscular disorder (HCC)    Neuropathy    Paroxysmal atrial fibrillation (HCC)    Positive blood culture 02/10/2022   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   Seizure (HCC)    08/2020 per patient   Seizures (HCC)    xanax  withdrawl- December 2013   Urinary incontinence 06/09/2012   Past Surgical History:  Procedure Laterality Date   APPENDECTOMY     BIOPSY  01/04/2021   Procedure: BIOPSY;  Surgeon: Leigh Elspeth SQUIBB, MD;  Location: [facility] ENDOSCOPY;  Service: Gastroenterology;;   BIOPSY  03/12/2021   Procedure: BIOPSY;  Surgeon: Wilhelmenia Aloha Raddle., MD;  Location: [facility] ENDOSCOPY;  Service: Gastroenterology;;   CARDIOVERSION N/A 01/28/2022   Procedure: CARDIOVERSION;  Surgeon: Ladona Heinz, MD;  Location: [facility] ENDOSCOPY;  Service: Cardiovascular;  Laterality: N/A;   CATARACT EXTRACTION  06/09/2012   Left eye   COLONOSCOPY WITH PROPOFOL  N/A 03/12/2021   Procedure: COLONOSCOPY WITH PROPOFOL ;  Surgeon: Wilhelmenia Aloha Raddle., MD;  Location: [facility] ENDOSCOPY;  Service: Gastroenterology;  Laterality: N/A;   ESOPHAGOGASTRODUODENOSCOPY (EGD) WITH PROPOFOL  N/A 01/04/2021   Procedure: ESOPHAGOGASTRODUODENOSCOPY (EGD) WITH PROPOFOL ;  Surgeon: Leigh Elspeth SQUIBB, MD;  Location: [facility] ENDOSCOPY;  Service: Gastroenterology;  Laterality: N/A;   left shoulder dislocation  Sept 2011   POLYPECTOMY  03/12/2021   Procedure: POLYPECTOMY;  Surgeon: Mansouraty, Aloha Raddle., MD;  Location: [facility] ENDOSCOPY;  Service: Gastroenterology;;   Family History  Problem Relation Age of Onset   Hypertension Mother    Hyperlipidemia Mother    Aneurysm Mother        Rupture - Cause of death   Heart disease Father        MI - cause of death   Depression Father    Parkinsonism Father    Hypertension Sister    Colon cancer Neg Hx    Esophageal cancer Neg Hx    Rectal cancer Neg Hx    Stomach cancer Neg Hx    Social  History   Occupational History   Not on file  Tobacco Use   Smoking status: Every Day    Current packs/day: 0.50    Average packs/day: 0.5 packs/day for 43.0 years (21.5 ttl pk-yrs)    Types: Cigarettes    Passive exposure: Current   Smokeless tobacco: Never   Tobacco comments:    Tobacco info given  Vaping Use   Vaping status: Never Used  Substance and Sexual Activity   Alcohol  use: Not Currently    Comment: Per patient: No ETOH 06/21/2024   Drug use: Yes    Types: Marijuana, Other-see comments    Comment: Past hx of benzo abuse--xanax    Sexual activity: Not on file   Tobacco Counseling Ready to quit: Not Answered Counseling given: Not Answered Tobacco comments: Tobacco info given  SDOH Screenings   Food Insecurity: Food Insecurity Present (05/06/2024)   Received from [facility]  Housing: Low Risk (06/21/2024)  Recent Concern: Housing - High Risk (05/06/2024)   Received from [facility]  Transportation Needs: Unmet Transportation Needs (06/21/2024)  Utilities: Not At Risk (06/21/2024)  Recent Concern: Utilities - At Risk (05/06/2024)   Received from [facility]  Alcohol  Screen: Low Risk (11/28/2022)  Depression (PHQ2-9): Medium Risk (06/21/2024)  Financial Resource Strain: High Risk (11/24/2023)  Physical Activity: Inactive (06/21/2024)  Social Connections: Socially Isolated (06/21/2024)  Stress: Stress Concern Present (06/21/2024)  Tobacco Use: High Risk (06/21/2024)  Health Literacy: Patient Declined (06/21/2024)   See flowsheets for full screening details  Depression Screen Depression Screening Exception Documentation Depression Screening Exception:: Patient refusal  PHQ 2 & 9 Depression Scale- Over the past 2 weeks, how often have you been bothered by any of the following problems? Little interest or pleasure in doing things: 0 Feeling down, depressed, or hopeless (PHQ Adolescent also includes...irritable): 3 PHQ-2 Total Score: 3 Trouble falling or staying asleep, or  sleeping too much: 3 Feeling tired or having little energy: 3 Poor appetite or overeating (PHQ Adolescent also includes...weight loss): 0 Feeling bad about yourself - or that you are a failure or have let yourself or your family down: 0 Trouble concentrating on things, such as reading the newspaper or watching television (PHQ Adolescent also includes...like school work): 0 Moving or speaking so slowly that other people could have noticed. Or the opposite - being so fidgety or restless that you have been moving around a lot more than usual: 0 Thoughts that you would be better off dead, or of hurting yourself in some way: 0 PHQ-9 Total Score: 9 If you checked off any problems, how difficult have these problems made it for you to do your work, take care of things at home, or get along with other people?: Not difficult at all  Depression Treatment Depression Interventions/Treatment :  PHQ2-9 Score <4 Follow-up Not Indicated     Goals Addressed             This Visit's Progress    06/22/2023: To get rid of neuropathy in my feet.               Objective:    Today's Vitals   06/21/24 1333  Weight: 137 lb (62.1 kg)  Height: 5' 5 (1.651 m)  PainSc: 8   PainLoc: Generalized   Body mass index is 22.8 kg/m.  Hearing/Vision screen No results found. Immunizations and Health Maintenance Health Maintenance  Topic Date Due   Zoster Vaccines- Shingrix (1 of 2) Never done   Mammogram  02/05/2023   Lung Cancer Screening  01/28/2024   COVID-19 Vaccine (5 - 2025-26 season) 02/13/2024   Colonoscopy  03/12/2024   Medicare Annual Wellness (AWV)  06/21/2025   DTaP/Tdap/Td (2 - Td or Tdap) 04/18/2031   Pneumococcal Vaccine: 50+ Years  Completed   Influenza Vaccine  Completed   Bone Density Scan  Completed   Hepatitis C Screening  Completed   Meningococcal B Vaccine  Aged Out        Assessment/Plan:  This is a routine wellness examination for Karen Casey.  Patient Care Team: Donzetta Rollene BRAVO, MD as PCP - General (Family Medicine) Tonuzi, Lirim, MD as Referring Physician (Neurology)  I have personally reviewed and noted the following in the patients chart:   Medical and social history Use of alcohol , tobacco or illicit drugs  Current medications and supplements including opioid prescriptions. Functional ability and status Nutritional status Physical activity Advanced directives List of other physicians Hospitalizations, surgeries, and ER visits in previous 12 months Vitals Screenings to include cognitive, depression, and falls Referrals and appointments  No orders of the defined types were placed in this encounter.  In addition, I have reviewed and discussed with patient certain preventive protocols, quality metrics, and best practice recommendations. A written personalized care plan for preventive services as well as general preventive health recommendations were provided to patient.   Karen LOISE Fuller, LPN   01/13/7972   Return in about 1 year (around 06/21/2025) for Medicare wellness.  After Visit Summary: (MyChart) Due to this being a telephonic visit, the after visit summary with patients personalized plan was offered to patient via MyChart   Nurse Notes:  Screenings not ordered: Will discuss scheduling mammogram, lung cancer screening, and colonoscopy with PCP at next visit HM Addressed: Vaccines Due: Shingrix and Covid-19.  "

## 2024-06-26 ENCOUNTER — Telehealth: Admitting: Nurse Practitioner

## 2024-06-26 DIAGNOSIS — F331 Major depressive disorder, recurrent, moderate: Secondary | ICD-10-CM | POA: Diagnosis not present

## 2024-06-26 DIAGNOSIS — F5105 Insomnia due to other mental disorder: Secondary | ICD-10-CM | POA: Diagnosis not present

## 2024-06-26 DIAGNOSIS — F419 Anxiety disorder, unspecified: Secondary | ICD-10-CM | POA: Diagnosis not present

## 2024-06-26 NOTE — Progress Notes (Signed)
 "  Subjective:  I'm doing a better.    HPI: Karen Casey is a 69 y.o. female presenting on 06/26/2024 via telehealth for medication management.  Patient reports she is doing a lot better. She has moved out of her daughter's house and moved in with her sister over the holiday and things are a lot better. She reports he sister has done a lot for her and she even has her own room.   She reports she is doing well on her medication. She is no longer taking any benzodiazepines and drinking alcohol . She is requesting Hydroxyzine  to take as needed for her anxiety. She reports improvement with decreased anxiety, depressive symptoms, apprehensions, worries and remains stable overall since moving out of her daughter's house.  She denies poor appetite, sleep issues, adverse reaction to her medications, psychosis, delusions, suicidal or homicidal ideations.   ROS: Negative unless specifically indicated above in HPI.   Relevant past medical history reviewed and updated as indicated.   Allergies and medications reviewed and updated.   Current Outpatient Medications  Medication Instructions   albuterol  (VENTOLIN  HFA) 108 (90 Base) MCG/ACT inhaler 2 puffs, Inhalation, Every 6 hours PRN, INHALE 2 PUFFS BY MOUTH EVERY 6 HOURS AS NEEDED FOR WHEEZING FOR SHORTNESS OF BREATH   busPIRone  (BUSPAR ) 20 mg, 3 times daily   cholecalciferol  (VITAMIN D3) 1,000 Units, Daily   clonazePAM  (KLONOPIN ) 0.25 mg, Oral, 2 times daily PRN, Ok to fill 12/29/22   clonazePAM  (KLONOPIN ) 0.25 mg, 2 times daily   cyanocobalamin  1,000 mcg, Daily   DULoxetine  (CYMBALTA ) 30 mg, Daily   ferrous sulfate  325 mg, Daily with breakfast   Fluticasone -Umeclidin-Vilant (TRELEGY ELLIPTA ) 100-62.5-25 MCG/ACT AEPB 1 puff, Inhalation, Daily   folic acid  (FOLVITE ) 1 mg, Oral, Daily   gabapentin  (NEURONTIN ) 800 mg, Oral, 3 times daily   guaifenesin  (HUMIBID E) 400 mg, Every 4 hours   nicotine  polacrilex (FT NICOTINE ) 2 mg, Oral, As needed    ondansetron  (ZOFRAN -ODT) 4 mg, Oral, Every 8 hours PRN   pantoprazole  (PROTONIX ) 40 mg, Oral, Daily   sodium chloride  1 g, 3 times daily with meals   traZODone  (DESYREL ) 150 mg, Oral, At bedtime PRN   traZODone  (DESYREL ) 150 mg, Daily at bedtime     Allergies[1]  Objective:   There were no vitals taken for this visit.   Physical Exam Constitutional:      Appearance: Normal appearance.  HENT:     Head: Normocephalic.  Eyes:     Conjunctiva/sclera: Conjunctivae normal.  Neurological:     General: No focal deficit present.     Mental Status: She is alert and oriented to person, place, and time.  Psychiatric:        Mood and Affect: Mood normal.        Behavior: Behavior normal.        Thought Content: Thought content normal.        Judgment: Judgment normal.    Assessment & Plan:   Assessment & Plan Moderate episode of recurrent major depressive disorder (HCC)     Anxiety     Insomnia disorder, with non-sleep disorder mental comorbidity, recurrent     Continue Buspar  15 mg po tid for anxiety  Continue Trazodone  150 mg po at bedtime for anxiety/sleep Continue Duloxetine  30 mg po daily for depression/anxiety Start Hydroxyzine  25 mg po bid prn fro anxiety   Follow up plan: Return in about 4 weeks (around 07/24/2024) for Medication Follow-up.  Florencia Cousin, NP      [  1]  Allergies Allergen Reactions   Capsaicin -Menthol Rash and Other (See Comments)    Burning and peeling on area applied to   Diclo Gel [Diclofenac  Sodium] Rash and Other (See Comments)    Burning and peeling at the sight of application.   "

## 2024-07-14 NOTE — Assessment & Plan Note (Signed)
 Karen Casey

## 2024-07-14 NOTE — Assessment & Plan Note (Signed)
 SABRA

## 2024-07-16 ENCOUNTER — Ambulatory Visit: Admitting: Family Medicine

## 2024-07-19 ENCOUNTER — Ambulatory Visit: Payer: Self-pay | Admitting: Family Medicine

## 2024-07-24 ENCOUNTER — Telehealth: Payer: Self-pay | Admitting: Nurse Practitioner

## 2024-07-27 ENCOUNTER — Ambulatory Visit: Admitting: Family Medicine
